# Patient Record
Sex: Female | Born: 1955 | Race: Black or African American | Hispanic: No | Smoking: Former smoker
Health system: Southern US, Community
[De-identification: ages and names within clinical notes are randomized; demographics above are authoritative.]

## PROBLEM LIST (undated history)

## (undated) ENCOUNTER — Emergency Department: Admission: EM | Payer: Medicare Other | Source: Home / Self Care

## (undated) DIAGNOSIS — I10 Essential (primary) hypertension: Secondary | ICD-10-CM

## (undated) DIAGNOSIS — J45909 Unspecified asthma, uncomplicated: Secondary | ICD-10-CM

## (undated) DIAGNOSIS — I739 Peripheral vascular disease, unspecified: Secondary | ICD-10-CM

## (undated) DIAGNOSIS — E7800 Pure hypercholesterolemia, unspecified: Secondary | ICD-10-CM

## (undated) DIAGNOSIS — D649 Anemia, unspecified: Secondary | ICD-10-CM

## (undated) DIAGNOSIS — M1990 Unspecified osteoarthritis, unspecified site: Secondary | ICD-10-CM

## (undated) DIAGNOSIS — F32A Depression, unspecified: Secondary | ICD-10-CM

## (undated) DIAGNOSIS — Z972 Presence of dental prosthetic device (complete) (partial): Secondary | ICD-10-CM

## (undated) DIAGNOSIS — K115 Sialolithiasis: Secondary | ICD-10-CM

## (undated) DIAGNOSIS — Z923 Personal history of irradiation: Secondary | ICD-10-CM

## (undated) DIAGNOSIS — J449 Chronic obstructive pulmonary disease, unspecified: Secondary | ICD-10-CM

## (undated) DIAGNOSIS — Z9981 Dependence on supplemental oxygen: Secondary | ICD-10-CM

## (undated) DIAGNOSIS — C801 Malignant (primary) neoplasm, unspecified: Secondary | ICD-10-CM

## (undated) DIAGNOSIS — IMO0001 Reserved for inherently not codable concepts without codable children: Secondary | ICD-10-CM

## (undated) DIAGNOSIS — F329 Major depressive disorder, single episode, unspecified: Secondary | ICD-10-CM

## (undated) DIAGNOSIS — G4730 Sleep apnea, unspecified: Secondary | ICD-10-CM

## (undated) DIAGNOSIS — I70219 Atherosclerosis of native arteries of extremities with intermittent claudication, unspecified extremity: Secondary | ICD-10-CM

## (undated) DIAGNOSIS — E119 Type 2 diabetes mellitus without complications: Secondary | ICD-10-CM

## (undated) HISTORY — DX: Anemia, unspecified: D64.9

## (undated) HISTORY — DX: Atherosclerosis of native arteries of extremities with intermittent claudication, unspecified extremity: I70.219

## (undated) HISTORY — DX: Peripheral vascular disease, unspecified: I73.9

## (undated) HISTORY — DX: Essential (primary) hypertension: I10

## (undated) HISTORY — PX: CYST REMOVAL LEG: SHX6280

## (undated) HISTORY — DX: Sialolithiasis: K11.5

## (undated) SURGERY — Surgical Case
Anesthesia: *Unknown

---

## 2010-04-09 ENCOUNTER — Ambulatory Visit: Payer: Self-pay

## 2010-08-03 ENCOUNTER — Emergency Department: Payer: Self-pay | Admitting: Emergency Medicine

## 2010-08-17 DIAGNOSIS — C801 Malignant (primary) neoplasm, unspecified: Secondary | ICD-10-CM

## 2010-08-17 HISTORY — DX: Malignant (primary) neoplasm, unspecified: C80.1

## 2011-07-18 ENCOUNTER — Ambulatory Visit: Payer: Self-pay | Admitting: Internal Medicine

## 2011-07-30 ENCOUNTER — Inpatient Hospital Stay: Payer: Self-pay | Admitting: Internal Medicine

## 2011-08-05 ENCOUNTER — Ambulatory Visit: Payer: Self-pay | Admitting: Internal Medicine

## 2011-08-18 ENCOUNTER — Ambulatory Visit: Payer: Self-pay | Admitting: Internal Medicine

## 2011-09-18 ENCOUNTER — Ambulatory Visit: Payer: Self-pay | Admitting: Internal Medicine

## 2011-09-29 ENCOUNTER — Encounter: Payer: Self-pay | Admitting: Cardiothoracic Surgery

## 2011-10-16 ENCOUNTER — Ambulatory Visit: Payer: Self-pay | Admitting: Internal Medicine

## 2011-10-16 ENCOUNTER — Encounter: Payer: Self-pay | Admitting: Cardiothoracic Surgery

## 2011-11-16 ENCOUNTER — Ambulatory Visit: Payer: Self-pay | Admitting: Internal Medicine

## 2011-11-30 ENCOUNTER — Encounter: Payer: Self-pay | Admitting: Cardiothoracic Surgery

## 2011-12-16 ENCOUNTER — Ambulatory Visit: Payer: Self-pay | Admitting: Internal Medicine

## 2011-12-16 ENCOUNTER — Encounter: Payer: Self-pay | Admitting: Cardiothoracic Surgery

## 2011-12-22 ENCOUNTER — Other Ambulatory Visit (HOSPITAL_COMMUNITY): Payer: Self-pay | Admitting: Cardiothoracic Surgery

## 2011-12-22 DIAGNOSIS — R911 Solitary pulmonary nodule: Secondary | ICD-10-CM

## 2011-12-25 ENCOUNTER — Other Ambulatory Visit: Payer: Self-pay | Admitting: Physician Assistant

## 2011-12-28 ENCOUNTER — Other Ambulatory Visit: Payer: Self-pay | Admitting: Radiology

## 2011-12-30 ENCOUNTER — Encounter (HOSPITAL_COMMUNITY): Payer: Self-pay

## 2011-12-30 ENCOUNTER — Ambulatory Visit (HOSPITAL_COMMUNITY)
Admission: RE | Admit: 2011-12-30 | Discharge: 2011-12-30 | Disposition: A | Discharge status: Home / Self Care | Payer: Medicaid Other | Source: Ambulatory Visit | Attending: Cardiothoracic Surgery | Admitting: Cardiothoracic Surgery

## 2011-12-30 ENCOUNTER — Ambulatory Visit (HOSPITAL_COMMUNITY)
Admission: RE | Admit: 2011-12-30 | Discharge: 2011-12-30 | Disposition: A | Discharge status: Home / Self Care | Payer: Medicaid Other | Source: Ambulatory Visit | Attending: Interventional Radiology | Admitting: Interventional Radiology

## 2011-12-30 VITALS — BP 101/59 | HR 87 | Temp 97.4°F | Resp 16 | Ht 59.0 in | Wt 131.0 lb

## 2011-12-30 DIAGNOSIS — J449 Chronic obstructive pulmonary disease, unspecified: Secondary | ICD-10-CM | POA: Insufficient documentation

## 2011-12-30 DIAGNOSIS — J984 Other disorders of lung: Secondary | ICD-10-CM | POA: Insufficient documentation

## 2011-12-30 DIAGNOSIS — Z79899 Other long term (current) drug therapy: Secondary | ICD-10-CM | POA: Insufficient documentation

## 2011-12-30 DIAGNOSIS — J4489 Other specified chronic obstructive pulmonary disease: Secondary | ICD-10-CM | POA: Insufficient documentation

## 2011-12-30 DIAGNOSIS — I10 Essential (primary) hypertension: Secondary | ICD-10-CM | POA: Insufficient documentation

## 2011-12-30 DIAGNOSIS — E7800 Pure hypercholesterolemia, unspecified: Secondary | ICD-10-CM | POA: Insufficient documentation

## 2011-12-30 DIAGNOSIS — R911 Solitary pulmonary nodule: Secondary | ICD-10-CM

## 2011-12-30 DIAGNOSIS — R918 Other nonspecific abnormal finding of lung field: Secondary | ICD-10-CM | POA: Insufficient documentation

## 2011-12-30 HISTORY — DX: Chronic obstructive pulmonary disease, unspecified: J44.9

## 2011-12-30 HISTORY — PX: LUNG BIOPSY: SHX232

## 2011-12-30 HISTORY — DX: Pure hypercholesterolemia, unspecified: E78.00

## 2011-12-30 HISTORY — DX: Essential (primary) hypertension: I10

## 2011-12-30 LAB — CBC
HCT: 39 % (ref 36.0–46.0)
Hemoglobin: 12.7 g/dL (ref 12.0–15.0)
MCHC: 32.6 g/dL (ref 30.0–36.0)
RDW: 13 % (ref 11.5–15.5)
WBC: 6.1 10*3/uL (ref 4.0–10.5)

## 2011-12-30 LAB — PROTIME-INR
INR: 0.94 (ref 0.00–1.49)
Prothrombin Time: 12.8 seconds (ref 11.6–15.2)

## 2011-12-30 LAB — APTT: aPTT: 31 seconds (ref 24–37)

## 2011-12-30 MED ORDER — SODIUM CHLORIDE 0.9 % IV SOLN
INTRAVENOUS | Status: DC
  Administered 2011-12-30: 10:00:00 via INTRAVENOUS

## 2011-12-30 MED ORDER — SODIUM CHLORIDE 0.9 % IV SOLN: INTRAVENOUS | Status: DC

## 2011-12-30 MED ORDER — FENTANYL CITRATE 0.05 MG/ML IJ SOLN
INTRAMUSCULAR | Status: AC
  Filled 2011-12-30: qty 4

## 2011-12-30 MED ORDER — HYDROCODONE-ACETAMINOPHEN 5-325 MG PO TABS: 1.0000 | ORAL_TABLET | ORAL | Status: DC | PRN

## 2011-12-30 MED ORDER — FENTANYL CITRATE 0.05 MG/ML IJ SOLN
INTRAMUSCULAR | Status: AC | PRN
  Administered 2011-12-30: 100 ug via INTRAVENOUS
  Administered 2011-12-30 (×2): 50 ug via INTRAVENOUS

## 2011-12-30 MED ORDER — MIDAZOLAM HCL 2 MG/2ML IJ SOLN
INTRAMUSCULAR | Status: AC
  Filled 2011-12-30: qty 4

## 2011-12-30 MED ORDER — MIDAZOLAM HCL 5 MG/5ML IJ SOLN
INTRAMUSCULAR | Status: AC | PRN
  Administered 2011-12-30: 1 mg via INTRAVENOUS
  Administered 2011-12-30: 2 mg via INTRAVENOUS
  Administered 2011-12-30: 1 mg via INTRAVENOUS

## 2011-12-30 NOTE — Procedures (Signed)
Procedure:  CT guided core biopsy of right upper lobe lung nodules x 2 Findings:  9 mm superior RUL nodule bx w/ outer 17 G needle and 18 G core bx x 2 15 mm inferior RUL nodule bx w/ 18 G core bx x 2 via 17 G needle. No PTX post bx by CT.  Focal hemorrhage around inferior lesion.  No hemoptysis or symptoms currently. Plan:  CXR in 2 hrs.  D/C in 2.5-3 hrs if CXR neg for PTX.

## 2011-12-30 NOTE — H&P (Signed)
Angela Jensen is an 56 y.o. female.   Chief Complaint: "I'm here for a lung biopsy" HPI: Patient with  history of tobacco abuse, COPD and two right upper lobe lung lesions presents today for CT guided right lung lesion(s) biopsy.  Past Medical History  Diagnosis Date  . COPD (chronic obstructive pulmonary disease)   . Hypertension   . Hypercholesteremia   pt denies DM, CAD, cancer, CVA  Past Surgical History  Procedure Date  . Cesarean section     x3  . Lung biopsy 05 15 2013    has lung "spots"   Family History: mom with DM , father deceased from lung cancer/CVA, 6 siblings Social History: prior long term smoker, prior alcohol use;married, lives in [place], 3 children, not employed Allergies: No Known Allergies  Current outpatient prescriptions:albuterol (PROVENTIL) (2.5 MG/3ML) 0.083% nebulizer solution, Take 2.5 mg by nebulization every 6 (six) hours as needed. wheezing, Disp: , Rfl: ;  amLODipine (NORVASC) 10 MG tablet, Take 10 mg by mouth daily., Disp: , Rfl: ;  atorvastatin (LIPITOR) 10 MG tablet, Take 10 mg by mouth daily., Disp: , Rfl: ;  hydrochlorothiazide (HYDRODIURIL) 25 MG tablet, Take 25 mg by mouth daily., Disp: , Rfl:  lisinopril (PRINIVIL,ZESTRIL) 10 MG tablet, Take 10 mg by mouth daily., Disp: , Rfl: ;  tiotropium (SPIRIVA HANDIHALER) 18 MCG inhalation capsule, Place 18 mcg into inhaler and inhale daily., Disp: , Rfl:  Current facility-administered medications:0.9 %  sodium chloride infusion, , Intravenous, Continuous, Reola Calkins, MD, Last Rate: 50 mL/hr at 12/30/11 1011;  fentaNYL (SUBLIMAZE) 0.05 MG/ML injection, , , , ;  midazolam (VERSED) 2 MG/2ML injection, , , ,    Results for orders placed during the hospital encounter of 12/30/11 (from the past 48 hour(s))  CBC     Status: Normal   Collection Time   12/30/11 10:00 AM      Component Value Range Comment   WBC 6.1  4.0 - 10.5 (K/uL)    RBC 4.40  3.87 - 5.11 (MIL/uL)    Hemoglobin 12.7  12.0 - 15.0 (g/dL)     HCT 45.4  09.8 - 11.9 (%)    MCV 88.6  78.0 - 100.0 (fL)    MCH 28.9  26.0 - 34.0 (pg)    MCHC 32.6  30.0 - 36.0 (g/dL)    RDW 14.7  82.9 - 56.2 (%)    Platelets 284  150 - 400 (K/uL)    Results for orders placed during the hospital encounter of 12/30/11  CBC      Component Value Range   WBC 6.1  4.0 - 10.5 (K/uL)   RBC 4.40  3.87 - 5.11 (MIL/uL)   Hemoglobin 12.7  12.0 - 15.0 (g/dL)   HCT 13.0  86.5 - 78.4 (%)   MCV 88.6  78.0 - 100.0 (fL)   MCH 28.9  26.0 - 34.0 (pg)   MCHC 32.6  30.0 - 36.0 (g/dL)   RDW 69.6  29.5 - 28.4 (%)   Platelets 284  150 - 400 (K/uL)    Review of Systems  Constitutional: Negative for fever and chills.  Respiratory: Positive for cough and shortness of breath.   Cardiovascular:       Intermittent rt lower/lat chest discomfort  Gastrointestinal: Negative for nausea, vomiting and abdominal pain.  Musculoskeletal: Negative for back pain.  Neurological: Negative for headaches.  Endo/Heme/Allergies: Does not bruise/bleed easily.    Blood pressure 117/75, pulse 85, temperature 97.2 F (36.2 C), temperature  source Oral, resp. rate 18, height 4\' 11"  (1.499 m), weight 131 lb (59.421 kg), SpO2 99.00%. Physical Exam  Constitutional: She is oriented to person, place, and time. She appears well-developed and well-nourished.  Cardiovascular: Normal rate and regular rhythm.   Respiratory: Effort normal.       Distant BS  GI: Soft. Bowel sounds are normal.  Musculoskeletal: Normal range of motion. She exhibits no edema.  Neurological: She is alert and oriented to person, place, and time.     Assessment/Plan Patient with COPD, prior tobacco abuse and 2 right upper lobe lung lesions; plan is for CT guided biopsy of one or both right lung lesions today if possible. Details/risks of procedure, including but not limited to , internal bleeding, infection, pneumothorax with chest tube placement, d/w pt/family with their understanding and consent. Arraya Buck,D  KEVIN 12/30/2011, 10:36 AM

## 2011-12-30 NOTE — H&P (Signed)
Agree.  Will plan to biopsy both RUL nodules.

## 2011-12-30 NOTE — Discharge Instructions (Signed)
Lung Biopsy A Lung Biopsy involves taking a small piece of lung tissue so it can be examined under a microscope by a pathologist to help diagnose many different lung disorders. This test can be done in a variety of ways. One way is to open the chest during surgery and remove a piece of lung tissue. It can also be done through a bronchoscope (a pencil-sized telescope) which allows the caregiver to remove a piece of tissue through your trachea (the large breathing tube to your lungs). Another procedure is a needle biopsy of the lung, which involves inserting a needle into the lung and removing a small piece of tissue. POSSIBLE COMPLICATIONS INCLUDE:  Collapse of the lung   Bleeding   Infection  PREPARATION FOR TEST No preparation or fasting is necessary. NORMAL FINDINGS No evidence of pathology. Ranges for normal findings may vary among different laboratories and hospitals. You should always check with your doctor after having lab work or other tests done to discuss the meaning of your test results and whether your values are considered within normal limits. MEANING OF TEST  Your caregiver will go over the test results with you and discuss the importance and meaning of your results, as well as treatment options and the need for additional tests if necessary. OBTAINING THE TEST RESULTS It is your responsibility to obtain your test results. Ask the lab or department performing the test when and how you will get your results. Document Released: 10/22/2004 Document Revised: 07/23/2011 Document Reviewed: 07/14/2008 [facility] Patient Information 2012 [place], [place].  Moderate Sedation, Adult Moderate sedation is given to help you relax or even sleep through a procedure. You may remain sleepy, be clumsy, or have poor balance for several hours following this procedure. Arrange for a responsible adult, family member, or friend to take you home. A responsible adult should stay with you for at least 24  hours or until the medicines have worn off.  Do not participate in any activities where you could become injured for the next 24 hours, or until you feel normal again. Do not:   Drive.   Swim.   Ride a bicycle.   Operate heavy machinery.   Cook.   Use power tools.   Climb ladders.   Work at [employer].   Do not make important decisions or sign legal documents until you are improved.   Vomiting may occur if you eat too soon. When you can drink without vomiting, try water, juice, or soup. Try solid foods if you feel little or no nausea.   Only take over-the-counter or prescription medications for pain, discomfort, or fever as directed by your caregiver.If pain medications have been prescribed for you, ask your caregiver how soon it is safe to take them.   Make sure you and your family fully understands everything about the medication given to you. Make sure you understand what side effects may occur.   You should not drink alcohol, take sleeping pills, or medications that cause drowsiness for at least 24 hours.   If you smoke, do not smoke alone.   If you are feeling better, you may resume normal activities 24 hours after receiving sedation.   Keep all appointments as scheduled. Follow all instructions.   Ask questions if you do not understand.  SEEK MEDICAL CARE IF:   Your skin is pale or bluish in color.   You continue to feel sick to your stomach (nauseous) or throw up (vomit).   Your pain is getting worse  and not helped by medication.   You have bleeding or swelling.   You are still sleepy or feeling clumsy after 24 hours.  SEEK IMMEDIATE MEDICAL CARE IF:   You develop a rash.   You have difficulty breathing.   You develop any type of allergic problem.   You have a fever.  Document Released: 04/28/2001 Document Revised: 07/23/2011 Document Reviewed: 09/19/2007 [facility] Patient Information 2012 [place], [place].

## 2012-01-07 ENCOUNTER — Ambulatory Visit: Payer: Self-pay | Admitting: Internal Medicine

## 2012-01-08 ENCOUNTER — Ambulatory Visit: Payer: Self-pay | Admitting: Internal Medicine

## 2012-01-11 ENCOUNTER — Ambulatory Visit: Payer: Self-pay | Admitting: Internal Medicine

## 2012-01-16 ENCOUNTER — Ambulatory Visit: Payer: Self-pay | Admitting: Internal Medicine

## 2012-01-16 ENCOUNTER — Encounter: Payer: Self-pay | Admitting: Cardiothoracic Surgery

## 2012-02-02 LAB — CBC CANCER CENTER
Basophil %: 0.9 %
Eosinophil %: 1.9 %
HCT: 38.6 % (ref 35.0–47.0)
HGB: 12.4 g/dL (ref 12.0–16.0)
MCHC: 32.1 g/dL (ref 32.0–36.0)
Monocyte #: 0.5 x10 3/mm (ref 0.2–0.9)
Monocyte %: 8.9 %
Neutrophil #: 3.4 x10 3/mm (ref 1.4–6.5)
Neutrophil %: 65.3 %
RBC: 4.35 10*6/uL (ref 3.80–5.20)

## 2012-02-09 LAB — CBC CANCER CENTER
Eosinophil %: 1.4 %
HGB: 13.2 g/dL (ref 12.0–16.0)
Lymphocyte #: 1 x10 3/mm (ref 1.0–3.6)
MCH: 28.1 pg (ref 26.0–34.0)
MCHC: 31.6 g/dL — ABNORMAL LOW (ref 32.0–36.0)
Monocyte #: 0.4 x10 3/mm (ref 0.2–0.9)
Neutrophil #: 3 x10 3/mm (ref 1.4–6.5)
Neutrophil %: 66.9 %

## 2012-02-15 ENCOUNTER — Ambulatory Visit: Payer: Self-pay | Admitting: Internal Medicine

## 2012-02-15 ENCOUNTER — Encounter: Payer: Self-pay | Admitting: Cardiothoracic Surgery

## 2012-02-16 LAB — CBC CANCER CENTER
Basophil #: 0 x10 3/mm (ref 0.0–0.1)
Basophil %: 0.9 %
Eosinophil #: 0.1 x10 3/mm (ref 0.0–0.7)
HGB: 13 g/dL (ref 12.0–16.0)
Lymphocyte %: 22 %
MCH: 28.3 pg (ref 26.0–34.0)
MCHC: 32.1 g/dL (ref 32.0–36.0)
Monocyte #: 0.4 x10 3/mm (ref 0.2–0.9)
Neutrophil %: 65.8 %
Platelet: 225 x10 3/mm (ref 150–440)
RBC: 4.58 10*6/uL (ref 3.80–5.20)
RDW: 13.1 % (ref 11.5–14.5)

## 2012-02-23 LAB — CBC CANCER CENTER
Basophil #: 0 x10 3/mm (ref 0.0–0.1)
Basophil %: 0.7 %
Eosinophil #: 0.1 x10 3/mm (ref 0.0–0.7)
Eosinophil %: 1.8 %
HGB: 12.9 g/dL (ref 12.0–16.0)
Lymphocyte #: 0.6 x10 3/mm — ABNORMAL LOW (ref 1.0–3.6)
MCH: 28.2 pg (ref 26.0–34.0)
MCHC: 31.9 g/dL — ABNORMAL LOW (ref 32.0–36.0)
MCV: 89 fL (ref 80–100)
Monocyte #: 0.4 x10 3/mm (ref 0.2–0.9)
Neutrophil %: 71.1 %
RBC: 4.57 10*6/uL (ref 3.80–5.20)

## 2012-03-01 LAB — CBC CANCER CENTER
Basophil %: 0.5 %
Eosinophil %: 2.7 %
Lymphocyte %: 13.7 %
Neutrophil %: 72.4 %

## 2012-03-17 ENCOUNTER — Encounter: Payer: Self-pay | Admitting: Cardiothoracic Surgery

## 2012-03-17 ENCOUNTER — Ambulatory Visit: Payer: Self-pay | Admitting: Internal Medicine

## 2012-04-17 ENCOUNTER — Ambulatory Visit: Payer: Self-pay | Admitting: Internal Medicine

## 2012-04-17 ENCOUNTER — Encounter: Payer: Self-pay | Admitting: Cardiothoracic Surgery

## 2012-05-17 ENCOUNTER — Ambulatory Visit: Payer: Self-pay | Admitting: Internal Medicine

## 2012-07-18 ENCOUNTER — Ambulatory Visit: Payer: Self-pay | Admitting: Internal Medicine

## 2012-07-18 LAB — CREATININE, SERUM: Creatinine: 0.78 mg/dL (ref 0.60–1.30)

## 2012-08-17 ENCOUNTER — Ambulatory Visit: Payer: Self-pay | Admitting: Internal Medicine

## 2012-09-21 ENCOUNTER — Ambulatory Visit: Payer: Self-pay | Admitting: Internal Medicine

## 2012-10-15 ENCOUNTER — Ambulatory Visit: Payer: Self-pay | Admitting: Internal Medicine

## 2012-11-11 LAB — CBC CANCER CENTER
Basophil #: 0 x10 3/mm (ref 0.0–0.1)
Basophil %: 0.9 %
HGB: 11.9 g/dL — ABNORMAL LOW (ref 12.0–16.0)
Monocyte #: 0.4 x10 3/mm (ref 0.2–0.9)
Monocyte %: 11.1 %
Neutrophil %: 58.1 %
Platelet: 212 x10 3/mm (ref 150–440)
RBC: 3.99 10*6/uL (ref 3.80–5.20)
RDW: 13.5 % (ref 11.5–14.5)
WBC: 4 x10 3/mm (ref 3.6–11.0)

## 2012-11-11 LAB — HEPATIC FUNCTION PANEL A (ARMC)
Bilirubin,Total: 0.4 mg/dL (ref 0.2–1.0)
SGOT(AST): 20 U/L (ref 15–37)

## 2012-11-11 LAB — BASIC METABOLIC PANEL
Chloride: 100 mmol/L (ref 98–107)
Creatinine: 1.03 mg/dL (ref 0.60–1.30)
EGFR (African American): >60
EGFR (Non-African Amer.): >60
Glucose: 176 mg/dL — ABNORMAL HIGH (ref 65–99)
Osmolality: 279 (ref 275–301)
Potassium: 3.9 mmol/L (ref 3.5–5.1)

## 2012-11-15 ENCOUNTER — Ambulatory Visit: Payer: Self-pay | Admitting: Internal Medicine

## 2012-11-23 ENCOUNTER — Ambulatory Visit: Payer: Self-pay | Admitting: Internal Medicine

## 2012-12-15 ENCOUNTER — Ambulatory Visit: Payer: Self-pay | Admitting: Internal Medicine

## 2013-01-31 ENCOUNTER — Ambulatory Visit: Payer: Self-pay

## 2013-02-06 ENCOUNTER — Ambulatory Visit: Payer: Self-pay

## 2013-03-21 ENCOUNTER — Ambulatory Visit: Payer: Self-pay | Admitting: Internal Medicine

## 2013-03-31 LAB — CBC CANCER CENTER
Basophil %: 1.2 %
Eosinophil %: 2 %
HCT: 37.4 % (ref 35.0–47.0)
HGB: 12.6 g/dL (ref 12.0–16.0)
Lymphocyte %: 23.6 %
MCH: 29.9 pg (ref 26.0–34.0)
MCV: 89 fL (ref 80–100)
Monocyte %: 6.7 %
Neutrophil #: 3.2 x10 3/mm (ref 1.4–6.5)
Platelet: 239 x10 3/mm (ref 150–440)
RBC: 4.21 10*6/uL (ref 3.80–5.20)
RDW: 12.9 % (ref 11.5–14.5)

## 2013-03-31 LAB — BASIC METABOLIC PANEL
Calcium, Total: 9.2 mg/dL (ref 8.5–10.1)
Co2: 29 mmol/L (ref 21–32)
EGFR (African American): >60
Osmolality: 278 (ref 275–301)
Potassium: 3.7 mmol/L (ref 3.5–5.1)

## 2013-03-31 LAB — HEPATIC FUNCTION PANEL A (ARMC)
Albumin: 4 g/dL (ref 3.4–5.0)
Alkaline Phosphatase: 89 U/L (ref 50–136)
Bilirubin,Total: 0.2 mg/dL (ref 0.2–1.0)
SGPT (ALT): 25 U/L (ref 12–78)
Total Protein: 7.8 g/dL (ref 6.4–8.2)

## 2013-04-17 ENCOUNTER — Ambulatory Visit: Payer: Self-pay | Admitting: Internal Medicine

## 2013-09-29 ENCOUNTER — Ambulatory Visit: Payer: Self-pay | Admitting: Internal Medicine

## 2013-10-02 LAB — HEPATIC FUNCTION PANEL A (ARMC)
AST: 22 U/L (ref 15–37)
Albumin: 3.9 g/dL (ref 3.4–5.0)
Alkaline Phosphatase: 66 U/L
BILIRUBIN TOTAL: 0.2 mg/dL (ref 0.2–1.0)
Bilirubin, Direct: <0.05 mg/dL (ref 0.00–0.20)
SGPT (ALT): 21 U/L (ref 12–78)
Total Protein: 7.6 g/dL (ref 6.4–8.2)

## 2013-10-02 LAB — CBC CANCER CENTER
Basophil #: 0.1 x10 3/mm (ref 0.0–0.1)
Basophil %: 1.3 %
EOS ABS: 0.2 x10 3/mm (ref 0.0–0.7)
Eosinophil %: 3.2 %
HCT: 39 % (ref 35.0–47.0)
HGB: 12.4 g/dL (ref 12.0–16.0)
LYMPHS ABS: 1.2 x10 3/mm (ref 1.0–3.6)
Lymphocyte %: 19.3 %
MCH: 28.5 pg (ref 26.0–34.0)
MCHC: 31.9 g/dL — ABNORMAL LOW (ref 32.0–36.0)
MCV: 89 fL (ref 80–100)
Monocyte #: 0.4 x10 3/mm (ref 0.2–0.9)
Monocyte %: 6.1 %
NEUTROS ABS: 4.2 x10 3/mm (ref 1.4–6.5)
Neutrophil %: 70.1 %
PLATELETS: 238 x10 3/mm (ref 150–440)
RBC: 4.36 10*6/uL (ref 3.80–5.20)
RDW: 13.2 % (ref 11.5–14.5)
WBC: 6 x10 3/mm (ref 3.6–11.0)

## 2013-10-02 LAB — BASIC METABOLIC PANEL
Anion Gap: 10 (ref 7–16)
BUN: 13 mg/dL (ref 7–18)
CHLORIDE: 103 mmol/L (ref 98–107)
Calcium, Total: 8.5 mg/dL (ref 8.5–10.1)
Co2: 31 mmol/L (ref 21–32)
Creatinine: 0.88 mg/dL (ref 0.60–1.30)
EGFR (African American): >60
EGFR (Non-African Amer.): >60
Glucose: 167 mg/dL — ABNORMAL HIGH (ref 65–99)
Osmolality: 291 (ref 275–301)
POTASSIUM: 3.6 mmol/L (ref 3.5–5.1)
SODIUM: 144 mmol/L (ref 136–145)

## 2013-10-15 ENCOUNTER — Ambulatory Visit: Payer: Self-pay | Admitting: Internal Medicine

## 2013-11-15 ENCOUNTER — Ambulatory Visit: Payer: Self-pay | Admitting: Internal Medicine

## 2014-02-13 ENCOUNTER — Ambulatory Visit: Payer: Self-pay | Admitting: Physician Assistant

## 2014-04-17 ENCOUNTER — Ambulatory Visit: Payer: Self-pay | Admitting: Internal Medicine

## 2014-04-17 LAB — HEPATIC FUNCTION PANEL A (ARMC)
ALK PHOS: 73 U/L
Albumin: 4.1 g/dL (ref 3.4–5.0)
Bilirubin,Total: 0.4 mg/dL (ref 0.2–1.0)
SGOT(AST): 27 U/L (ref 15–37)
SGPT (ALT): 29 U/L
Total Protein: 8.1 g/dL (ref 6.4–8.2)

## 2014-04-17 LAB — BASIC METABOLIC PANEL
Anion Gap: 9 (ref 7–16)
BUN: 10 mg/dL (ref 7–18)
CALCIUM: 9.1 mg/dL (ref 8.5–10.1)
Chloride: 102 mmol/L (ref 98–107)
Co2: 30 mmol/L (ref 21–32)
Creatinine: 0.9 mg/dL (ref 0.60–1.30)
EGFR (Non-African Amer.): >60
Glucose: 201 mg/dL — ABNORMAL HIGH (ref 65–99)
Osmolality: 286 (ref 275–301)
Potassium: 3.9 mmol/L (ref 3.5–5.1)
Sodium: 141 mmol/L (ref 136–145)

## 2014-04-17 LAB — CBC CANCER CENTER
BASOS ABS: 0.1 x10 3/mm (ref 0.0–0.1)
Basophil %: 1.2 %
EOS ABS: 0.2 x10 3/mm (ref 0.0–0.7)
EOS PCT: 3.2 %
HCT: 42.1 % (ref 35.0–47.0)
HGB: 13.3 g/dL (ref 12.0–16.0)
LYMPHS PCT: 22.6 %
Lymphocyte #: 1.1 x10 3/mm (ref 1.0–3.6)
MCH: 28.6 pg (ref 26.0–34.0)
MCHC: 31.7 g/dL — AB (ref 32.0–36.0)
MCV: 90 fL (ref 80–100)
Monocyte #: 0.3 x10 3/mm (ref 0.2–0.9)
Monocyte %: 6 %
NEUTROS ABS: 3.4 x10 3/mm (ref 1.4–6.5)
NEUTROS PCT: 67 %
PLATELETS: 254 x10 3/mm (ref 150–440)
RBC: 4.66 10*6/uL (ref 3.80–5.20)
RDW: 13.6 % (ref 11.5–14.5)
WBC: 5.1 x10 3/mm (ref 3.6–11.0)

## 2014-05-17 ENCOUNTER — Ambulatory Visit: Payer: Self-pay | Admitting: Internal Medicine

## 2014-10-16 ENCOUNTER — Ambulatory Visit
Admit: 2014-10-16 | Disposition: A | Discharge status: Home / Self Care | Payer: Self-pay | Attending: Internal Medicine | Admitting: Internal Medicine

## 2014-11-16 ENCOUNTER — Ambulatory Visit
Admit: 2014-11-16 | Disposition: A | Discharge status: Home / Self Care | Payer: Self-pay | Attending: Internal Medicine | Admitting: Internal Medicine

## 2014-11-30 ENCOUNTER — Other Ambulatory Visit: Payer: Self-pay | Admitting: Internal Medicine

## 2014-11-30 DIAGNOSIS — C3491 Malignant neoplasm of unspecified part of right bronchus or lung: Secondary | ICD-10-CM

## 2014-11-30 DIAGNOSIS — R911 Solitary pulmonary nodule: Secondary | ICD-10-CM

## 2014-12-09 NOTE — Discharge Summary (Signed)
PATIENT NAME:  Angela Jensen, Angela Jensen MR#:  [id number] DATE OF BIRTH:  06-15-1956  DATE OF ADMISSION:  07/30/2011 DATE OF DISCHARGE:  08/05/2011  ADMITTING DIAGNOSES:  1. Chronic obstructive pulmonary disease exacerbation.  2. Ongoing tobacco abuse.  3. Hypertension. 4. Hyperlipidemia.   DISCHARGE DIAGNOSES:  1. Chronic obstructive pulmonary disease. 2. Acute bronchitis. 3. Dehydration.  4. Right upper lobe 10 mm speculated nodule concerning for cancer on PET scan.  5. Hyponatremia, hypokalemia, and hypomagnesemia, resolved.  6. Hypertension. 7. Hyperlipidemia. 8. Ongoing tobacco abuse.   DISCHARGE CONDITION: Stable.   DISCHARGE MEDICATIONS: The patient is to resume her outpatient medications which include the following. 1. Hydrochlorothiazide/lisinopril 12.5/10 mg once daily. 2. Norvasc 10 mg p.o. daily. 3. Lipitor 10 mg p.o. at bedtime.   Additional Medications:  1. Prednisone 40 mg p.o. once on 08/06/2011 then taper by 10 mg every second day until stopped. 2. Advair Diskus 250/50 one puff twice daily.  3. Tiotropium inhaler one capsule daily via nebulizer machine every 4 hours p.r.n.  4. Combivent 2 puffs every two hours as needed.   HOME OXYGEN: Home oxygen with portable tank will be prescribed upon discharge at 2 liters of oxygen through nasal cannula on exertion.  DISCHARGE DIET:  2 gram salt, low fat.   PHYSICAL ACTIVITY LIMITATIONS: As tolerated.   DISCHARGE FOLLOWUP/INSTRUCTIONS:  Followup with Dr. Genevive Bi on 08/13/2011, on Thursday. Followup with Dr. Leia Alf on 08/21/2011. Also followup with the [facility] in 2 days after discharge. The patient was advised not to smoke. She also will have a prescription for a nebulizer machine She also has an appointment with the [facility] on 09/03/2011.  The patient received the pneumonia vaccine upon discharge.  HISTORY OF PRESENT ILLNESS: Please refer to Dr. Haynes Dage interim discharge summary on 08/04/2011.   The  patient is a 59 year old female with past medical history significant for history of tobacco abuse, long-standing tobacco abuse, who presented to the hospital with complaints of shortness of breath as well as cough. Please refer to Dr. Zigmund Gottron admission note on 07/30/2011.   On arrival to the emergency room, the patient's temperature was 98.6, pulse 102, respiration rate 16, blood pressure 92/59, and saturation was 93% on 3 liters of oxygen through nasal cannula. Physical exam revealed diffuse wheezing as well as coarse breath sounds on lung examination and prolonged inspiratory as well as expiratory phase, but no dullness to percussion.  The patient was admitted to the hospital. Her chest x-ray on arrival to the hospital, done on 07/30/2011, PA and lateral, revealed hyperinflation, otherwise no acute cardiopulmonary disease, however there were findings concerning for small nodule in the right lung apex were noted and CT was recommended.   HOSPITAL COURSE: The patient was admitted to the hospital for chronic obstructive pulmonary disease exacerbation. She was started on antibiotic therapy was well as steroids and inhalation therapy. With this therapy she improved. It was felt that the patient's COPD exacerbation was very likely related to bronchitis. Sputum cultures revealed scant growth of routine respiratory flora, rare gram-negative as well as a few white blood cells and few epithelial cells were noted. With conservative therapy the patient's condition improved. She is to continue tapering her steroids as well as continuing inhalation therapy and follow-up with her primary care physician for further recommendations. She however required oxygen upon discharge. She was evaluated for oxygen need and she was noted to have oxygen saturation of 85% on room air on exertion. She improved to  91% on 2 liters of oxygen on exertion. It was felt that 2 liters would be sufficient for her to be used on exertion as  well as possibly at bedtime as well. Her oxygen saturation improved to 92% whenever she was resting, on room air. It was felt that the patient is okay to take her oxygen off whenever she is resting. However, she will require at least 2 liters of oxygen through nasal cannula whenever she exerts herself. She is to followup with the [facility] as well as Dr. Leia Alf and Dr. Nestor Lewandowsky. The reason she needs to be followed up by Dr. Leia Alf as well as Dr. Genevive Bi is she was found to have a nodule in her right upper lung. CT scan done on 07/31/2011 revealed a spiculated nodule in the apex of the right upper lobe. Further evaluation with PET or CT scan was recommended for possibly further characterizing this finding. Indeterminate 9 and 4 mm nodules within the right upper lobe were also noted, also advanced emphysematous changes. The patient was evaluated by Dr. Ma Hillock as well as pulmonologist, Dr. Devona Konig. Dr. Ma Hillock felt that the patient has longstanding history of smoking and was admitted for symptoms of chronic obstructive pulmonary disease exacerbation and is clinically improving.  However, she was found to have right upper lobe nodules and PET scan was done on 08/03/2011. PET CT scan for lung cancer diagnosis revealed spiculated hypermetabolic 10 mm right upper lobe pulmonary nodule. Differential diagnosis includes malignancy versus granulomatous disease versus infectious or inflammatory nodule. Overall appearance is most concerning for malignancy until proven otherwise, according to the radiologist. For this reason Dr. Ma Hillock explained to the patient as well as to her husband the above findings all radiologic studies and films were reviewed with them. Dr. Ma Hillock explained the nodule would not be accessible by bronchoscopy and very likely too small to safely biopsy with CT-guided biopsy. Therefore he recommended to consult thoracic surgeon, Dr. Genevive Bi, for his opinion regarding whether the nodule  should be resected. Dr. Ma Hillock also explained to the family that if it is lung radiologically it appeared to be stage I disease and surgical resection would be likely curable. The patient also was explained that the PET scan also revealed a prominent left ovary measuring 3.9/2.4 cm, not reported to be hypermetabolic on PET scan, however, Dr. Ma Hillock recommended to get CA-125 level checked and follow-up with him as an outpatient. Dr. Genevive Bi saw the patient in consultation on 08/05/2011 and he recommended further assessment and treatment and to followup with him in the [place] in the next few days after discharge. Dr. Genevive Bi believed that PET scan likely represents malignancy in the right upper lobe. Given the acute illness, he recommended to let her get over this illness and see her back in the office in approximately one week. At that time, he could perform simple pulmonary function tests and he also noted that the patient's bicarbonate was reported to be 30 on admission suggesting the patient has some evidence of chronic hypercapnia. He also recommended to check arterial blood gases and counseled her about need to come to her followup. She agreed to do so and she is to follow-up with him in the next few days after discharge to make decisions about therapy.  On the day of discharge, 08/05/2011, the patient felt satisfactory and did not complain of any significant discomfort and certainly no significant shortness of breath at rest. Her vitals were stable with temperature of 98.1,  pulse 71, respiration rate 18, blood pressure 114/73, and saturation was 97% on 1 liter of oxygen through nasal cannula at rest and 92% on room air at rest, 85% on exertion on room air, and 91% on 2 liters of oxygen through nasal cannula on exertion.   The patient is being discharged with the above-mentioned medications and follow-up.  Of note, in regards to hypertension and hyperlipidemia, the patient is to continue her outpatient  medications. The patient's blood pressure was well controlled, however, the patient's lipid panel was not rechecked while she was here in the hospital. Of note, while in the hospital, the patient was also had one episode of hyponatremia with sodium level of 135. However, with IV fluid administration, the patient's sodium level normalized to 138 by the day of discharge, 08/05/2011. She was also noted to be hypokalemic and HER potassium level was replenished to 4.1 on 08/05/2011. It is recommended to follow the patient's potassium levels as well as sodium levels as HCTZ could cause chronic hyponatremia as well as hypokalemia. The patient is being discharged, as mentioned above, in stable condition with the above-mentioned medications and follow-up.   TIME SPENT: 40 minutes.  ____________________________ Theodoro Grist, MD rv:[id number] D: 08/07/2011 14:44:03 ET T: 08/08/2011 14:41:16 ET JOB#: [id number]  cc: Theodoro Grist, MD, <Dictator> [facility] [place] MD ELECTRONICALLY SIGNED 09/13/2011 9:51

## 2014-12-09 NOTE — Consult Note (Signed)
Reason for Visit: This 59 year old Female patient presents to the clinic for initial evaluation of  Right upper lobe nodules .   Referred by Dr. Ma Hillock.  Diagnosis:   Chief Complaint/Diagnosis   59 year old female with small right upper lobe nodules as yet unbiopsied   Imaging Report CT scans reviewed    Referral Report Clinical notes reviewed    Planned Treatment Regimen Further workup including possible biopsy    HPI   patient is a 59 year old female oxygen dependent presented to [facility] with obstructive symptoms and exacerbation of COPD. Chest x-ray initially showed a nodule in the right lung apex. CT scan on 07/31/11 showed a 1.1 cm nodule in the right upper lobe as well as a separate 9 mm nodule in the uric inferior aspect of the right upper lobe she also had significant advanced emphysematous changes of both lung fields. PET was performed showing a right upper lobe nodule with an SUV of 2.5. She also prominent left ovary measuring approximately 4 cm in greatest dimension. She has been seen by Dr. Faith Rogue has been started on pulmonary rehabilitation is post have repeat pulmonary function tests this week. Her prior tests showed an FEV1 of 540 mL. She seen today for radiation oncology opinion. She is doing fairly well. Her breathing has improved she still oxygen dependent.  Past, Family and Social History:   Past Medical History positive    Cardiovascular hyperlipidemia; hypertension    Respiratory bronchitis; COPD    Family History positive    Family History Comments Father with lung cancer family history of coronary vascular heart disease    Social History positive    Social History Comments 80-pack-year smoking history, history of marijuana use    Additional Past Medical and Surgical History Accompanied by daughter today   Allergies:   No Known Allergies:   Home Meds:  Home Medications: Medication Instructions Status  DuoNeb 0.5 mg-2.5 mg/3 mL inhalation  solution 3 milliliter(s) inhaled via neb machine every 4 hours as needed for SOB or wheezing Active  Lipitor 10 mg oral tablet 1 tab(s) orally once a day (at bedtime) Active  Ventolin HFA 90 mcg/inh inhalation aerosol 2 puff(s) inhaled 4 times a day Active  Advair Diskus 250 mcg-50 mcg inhalation powder 1 puff(s) inhaled 2 times a day Active  Spiriva 18 mcg inhalation capsule 1 each inhaled once a day Active  lisinopril 10 mg oral tablet 1 tab(s) orally once a day Active  hydrochlorothiazide 25 mg oral tablet 0.5 tab(s) orally once a day Active  amlodipine 10 mg oral tablet 1 tab(s) orally once a day Active  oxygen 2 L/m 24 hours a day  Active   Review of Systems:   General negative    Performance Status (ECOG) 0    Skin negative    Breast negative    Ophthalmologic negative    ENMT negative    Respiratory and Thorax see HPI    Cardiovascular negative    Gastrointestinal negative    Genitourinary negative    Musculoskeletal negative    Neurological negative    Psychiatric negative    Hematology/Lymphatics negative    Endocrine negative    Allergic/Immunologic negative   Nursing Notes:  Nursing Vital Signs and Chemo Nursing Nursing Notes: *CC Vital Signs Flowsheet:   09-Apr-13 11:01   Temp Temperature 98   Pulse Pulse 84   Respirations Respirations 20   SBP SBP 119   DBP DBP 76   Pain Scale (0-10)  1   Current Weight (kg) (kg) 57.2   Height (cm) centimeters 152.4   BSA (m2) 1.5   Physical Exam:  General/Skin/HEENT:   General normal    Skin normal    Eyes normal    ENMT normal    Head and Neck normal    Additional PE Well-developed female in NAD. She is on nasal oxygen. Lung fields are clear to A&P. Cardiac examination shows regular rate and rhythm without murmur rub or thrill. Abdomen is benign with no organomegaly or masses noted.   Breasts/Resp/CV/GI/GU:   Respiratory and Thorax normal    Cardiovascular normal    Gastrointestinal normal     Genitourinary normal   MS/Neuro/Psych/Lymph:   Musculoskeletal normal    Neurological normal    Lymphatics normal   Other Results:  Radiology Results: CT:    18-Dec-11 15:58, CT Head Without Contrast   CT Head Without Contrast    REASON FOR EXAM:    dizziness  COMMENTS:       PROCEDURE: CT  - CT HEAD WITHOUT CONTRAST  - Aug 03 2010  3:58PM     RESULT: Axial noncontrast CT scanning was performed through the brain at   5 mm intervals and slice thicknesses.    The ventricles arenormal in size and position. There is no intracranial   hemorrhage nor intracranial mass effect. There are punctate basal ganglia   calcifications noted on the right. The cerebellum and brainstem are   normal in density. There is no evidence of an evolving ischemic   infarction. At bone window settings the observed portions of the   paranasal sinuses and mastoid air cells are clear.    IMPRESSION:  I see no acute intracranial abnormality.          Verified By: DAVID A. Martinique, M.D., MD    28-Mar-13 10:59, CT Chest Without Contrast   CT Chest Without Contrast    REASON FOR EXAM:    FU lung mass  COMMENTS:       PROCEDURE: CT  - CT CHEST WITHOUT CONTRAST  - Nov 12 2011 10:59AM     RESULT: Indication: Lung mass in    Comparison: 07/31/2011    Technique: Multiple axial images of the chest are obtained without   intravenous contrast.    Findings:    The central airways are patent. There are severe bilateral emphysematous     changes. There is a 12 mm spiculated right apical pulmonary nodule   unchanged compared with 07/31/2011. There is a 18 mm subpleural nodular   opacity in the posterior segment of the right upper lobe with interval   enlargement compared with 07/31/2011. There is no pleural effusion or   pneumothorax.    There are no pathologically enlarged axillary, hilar, or mediastinal   lymph nodes.      The heart size is normal. There is no pericardial effusion. The thoracic    aorta is normal in caliber.      Review of bone windows demonstrates no focal lytic or sclerotic lesions.     Limited noncontrast images of the upper abdomen were obtained. The     adrenal glands appear normal. The remainder of the visualized abdominal   organs are unremarkable.    IMPRESSION:     1.  There is a 12 mm spiculated right apical pulmonary nodule unchanged   compared with 07/31/2011.  The appearance is concerning for malignancy   until proven otherwise.     2.There  is a 18 mm subpleural nodular opacity in the posterior segment of   the right upper lobe with interval enlargement compared with 07/31/2011.   This may be secondary to an infectious or inflammatory etiology, but   neoplasm cannot be excluded.        Verified By: Jennette Banker, M.D., MD  Nuclear Med:    17-Dec-12 15:40, PET/CT Scan Lung Cancer Diagnosis   PET/CT Scan Lung Cancer Diagnosis    REASON FOR EXAM:    spiculated pulmonary nodule  COMMENTS:       PROCEDURE: PET - PET/CT DX LUNG CA  - Aug 03 2011  3:40PM     RESULT: Indication: Spiculated pulmonary nodule  Radiopharmaceutical: 11.86 mCi F18-FDG, intravenously.    Technique: Imaging was performed from the skull base to the mid-thigh   using routine PET/CT acquisition protocol.    Injection site: Left forearm  Time of FDG injection: 1350 hours  Serum glucose: 99 mg/dL   Time of imaging: 1514 hours  Comparison studies: None    Findings:    HEAD AND NECK:     There is no abnormal hypermetabolic activity in the head and neck. There   is no cervical soft tissue mass or lymphadenopathy.     CHEST:    There is a 10 mm spiculated right apical pulmonary nodule weighted an SUV   maxof 2.5 and an SUV average of 2.5 . There bilateral emphysematous   changes. There is no other pulmonary mass or pulmonary nodule. There is   no focal parenchymal opacity, pleural effusion, or pneumothorax. The     heart size is normal. There is no pericardial  effusion.     There no pathologically enlarged mediastinal, hilar, or axillary lymph   nodes.     ABDOMEN/PELVIS:    The liver demonstrates no focal abnormality. The gallbladder is   unremarkable. The spleen demonstrates no focal abnormality. The kidneys,   adrenal glands, pancreas are normal. The bladder is unremarkable.     The unopacified bowel is unremarkable. There is no pneumoperitoneum,   pneumatosis, or portal venous gas. There is no abdominal or pelvic free   fluid. There is no lymphadenopathy. The left ovary is prominent in size   measuring 3.9 x 2.4 cm.  The abdominal aorta is normal in caliber.    The osseous structures are unremarkable.    IMPRESSION:     1. Spiculated hypermetabolic 10 mm right upper lobe pulmonary nodule.   Differential diagnosis includes malignancy versus granulomatous disease   versus an infectious or inflammatory nodule. The overall appearance is   most concerning for malignancy until proven otherwise.          Verified By: Jennette Banker, M.D.,MD   Assessment and Plan:  Impression:   not biopsy the right upper lobe nodules suspicious in 59 year old female  Plan:   this time I have had a long discussion with the patient and her daughter. Would not start radiation therapy in a patient without tissue diagnosis especially with nodules that are so small and innocuous. Would repeat her pulmonary function tests as planned this week. Could also follow the patient with repeat CT scans in 3-4 months to look for growth in these nodules and perhaps attempt at needle biopsy. Patient will be seen back as we by Dr. Faith Rogue who may make formal recommendations should her pulmonary function tests improved significantly. We will followup with her as needed. I have discussed the case personally with Dr. Ma Hillock.  I would like to take this opportunity to thank you for allowing me to continue to participate in this patient's care.  Electronic Signatures: Armstead Peaks (MD)  (Signed 09-Apr-13 14:14)  Authored: HPI, Diagnosis, PFSH, Allergies, Home Meds, ROS, Nursing Notes, Physical Exam, Other Results, Encounter Assessment and Plan   Last Updated: 09-Apr-13 14:14 by Armstead Peaks (MD)

## 2015-01-24 ENCOUNTER — Other Ambulatory Visit: Payer: Self-pay | Admitting: Internal Medicine

## 2015-01-24 ENCOUNTER — Ambulatory Visit
Admission: RE | Admit: 2015-01-24 | Discharge: 2015-01-24 | Disposition: A | Discharge status: Home / Self Care | Payer: Medicaid Other | Source: Ambulatory Visit | Attending: Internal Medicine | Admitting: Internal Medicine

## 2015-01-24 DIAGNOSIS — Z923 Personal history of irradiation: Secondary | ICD-10-CM | POA: Insufficient documentation

## 2015-01-24 DIAGNOSIS — C3491 Malignant neoplasm of unspecified part of right bronchus or lung: Secondary | ICD-10-CM | POA: Diagnosis not present

## 2015-01-24 DIAGNOSIS — R911 Solitary pulmonary nodule: Secondary | ICD-10-CM

## 2015-01-24 DIAGNOSIS — C3411 Malignant neoplasm of upper lobe, right bronchus or lung: Secondary | ICD-10-CM

## 2015-01-24 HISTORY — DX: Type 2 diabetes mellitus without complications: E11.9

## 2015-01-24 HISTORY — DX: Unspecified asthma, uncomplicated: J45.909

## 2015-01-24 HISTORY — DX: Malignant (primary) neoplasm, unspecified: C80.1

## 2015-01-24 MED ORDER — IOHEXOL 350 MG/ML SOLN
75.0000 mL | Freq: Once | INTRAVENOUS | Status: AC | PRN
  Administered 2015-01-24: 75 mL via INTRAVENOUS

## 2015-01-25 ENCOUNTER — Other Ambulatory Visit: Payer: Self-pay | Admitting: *Deleted

## 2015-01-25 DIAGNOSIS — C3411 Malignant neoplasm of upper lobe, right bronchus or lung: Secondary | ICD-10-CM

## 2015-01-28 ENCOUNTER — Ambulatory Visit: Payer: Medicaid Other

## 2015-01-30 ENCOUNTER — Ambulatory Visit
Admission: RE | Admit: 2015-01-30 | Discharge: 2015-01-30 | Disposition: A | Discharge status: Home / Self Care | Payer: Medicaid Other | Source: Ambulatory Visit | Attending: Internal Medicine | Admitting: Internal Medicine

## 2015-01-30 DIAGNOSIS — C3411 Malignant neoplasm of upper lobe, right bronchus or lung: Secondary | ICD-10-CM | POA: Diagnosis present

## 2015-01-30 LAB — GLUCOSE, CAPILLARY: Glucose-Capillary: 100 mg/dL — ABNORMAL HIGH (ref 65–99)

## 2015-01-30 MED ORDER — FLUDEOXYGLUCOSE F - 18 (FDG) INJECTION
12.5600 | Freq: Once | INTRAVENOUS | Status: AC | PRN
  Administered 2015-01-30: 12.56 via INTRAVENOUS

## 2015-02-07 ENCOUNTER — Encounter: Payer: Self-pay | Admitting: Cardiothoracic Surgery

## 2015-02-07 ENCOUNTER — Inpatient Hospital Stay: Payer: Medicaid Other | Attending: Cardiothoracic Surgery | Admitting: Cardiothoracic Surgery

## 2015-02-07 VITALS — BP 158/96 | HR 91 | Temp 98.2°F | Ht 59.0 in | Wt 117.6 lb

## 2015-02-07 DIAGNOSIS — C3411 Malignant neoplasm of upper lobe, right bronchus or lung: Secondary | ICD-10-CM

## 2015-02-07 DIAGNOSIS — R918 Other nonspecific abnormal finding of lung field: Secondary | ICD-10-CM | POA: Diagnosis not present

## 2015-02-07 NOTE — Progress Notes (Signed)
[name], female   DOB: [birth year], 59 y.o.   MRN: [id number]  HISTORY: I have been asked to reevaluate this patient again. She is known to me. She had a previous history of 2 right upper lobe lesions that were treated with radiation therapy. He has been followed by Dr. Loni Muse it and by Dr. said.Yancey Flemings. A repeat CT scan shows a new right upper lobe nodule. This was PET positive. Been asked to reevaluate patient for consideration of surgical intervention. Patient states that she does get short of breath with exertion. She's had some pulmonary function studies done with Dr. Yancey Flemings. We will obtain those results. I have independently reviewed the CT scan and PET scan. She does not smoke cigarettes although she does smoke several joints per day.   PERTINENT REVIEW OF SYSTEMS: Shortness of breath with exertion. No weight loss. No hemoptysis or cough.  Filed Vitals:   02/07/15 0935  BP: 158/96  Pulse: 91  Temp: 98.2 F (36.8 C)   Wt Readings from Last 3 Encounters:  02/07/15 117 lb 9.9 oz (53.35 kg)  01/24/15 120 lb (54.432 kg)  12/30/11 131 lb (59.421 kg)    EXAM: Head: Normocephalic and atraumatic.  Eyes:  Conjunctivae are normal. Pupils are equal, round, and reactive to light. No scleral icterus.  Neck:  Normal range of motion. Neck supple. No tracheal deviation present. No thyromegaly present.  Resp: Lungs are clear bilaterally.  No respiratory distress, normal effort. Heart:  Regular without murmurs Neurological: Alert and oriented to person, place, and time. Coordination normal.  Skin: Skin is warm and dry. No rash noted. No diaphoretic. No erythema. No pallor.  Psychiatric: Normal mood and affect. Normal behavior. Judgment and thought content normal.      ASSESSMENT: I have independently reviewed her CT scans. There is a new right upper lobe nodule measuring about 1 cm. I reviewed with her the options of surgical intervention. We also discussed radiation therapy and  radiofrequency ablation. The patient does not wish to pursue any surgical intervention at this time.   PLAN:   We will make an appointment for her to see Dr. Berton Mount to discuss the possibility of stereotactic body frame radiotherapy. She may also be a candidate for radiofrequency ablation. However she does not wish to pursue that at this point in time as well. I will make the referral to Dr. Donella Stade and also obtain her pulmonary function studies from Dr. Yancey Flemings. A return appointment with me was not made today at this time.    Nestor Lewandowsky, MD

## 2015-02-15 ENCOUNTER — Other Ambulatory Visit: Payer: Self-pay | Admitting: Internal Medicine

## 2015-02-15 DIAGNOSIS — C7A090 Malignant carcinoid tumor of the bronchus and lung: Secondary | ICD-10-CM

## 2015-02-20 ENCOUNTER — Institutional Professional Consult (permissible substitution): Payer: Medicaid Other | Admitting: Radiation Oncology

## 2015-02-22 ENCOUNTER — Inpatient Hospital Stay: Payer: Medicaid Other | Attending: Internal Medicine | Admitting: Internal Medicine

## 2015-02-25 ENCOUNTER — Other Ambulatory Visit: Payer: Self-pay

## 2015-02-25 DIAGNOSIS — C3411 Malignant neoplasm of upper lobe, right bronchus or lung: Secondary | ICD-10-CM

## 2015-03-11 ENCOUNTER — Other Ambulatory Visit: Payer: Self-pay | Admitting: *Deleted

## 2015-03-12 ENCOUNTER — Other Ambulatory Visit: Payer: Self-pay | Admitting: Interventional Radiology

## 2015-03-12 ENCOUNTER — Ambulatory Visit
Admission: RE | Admit: 2015-03-12 | Discharge: 2015-03-12 | Disposition: A | Discharge status: Home / Self Care | Payer: Medicaid Other | Source: Ambulatory Visit | Attending: Internal Medicine | Admitting: Internal Medicine

## 2015-03-12 DIAGNOSIS — C3411 Malignant neoplasm of upper lobe, right bronchus or lung: Secondary | ICD-10-CM | POA: Insufficient documentation

## 2015-03-12 NOTE — Consult Note (Signed)
Chief Complaint: Patient was seen in consultation today for management of a hypermetabolic right upper lobe pulmonary nodule at the request of Dr. Ma Hillock.  Chief Complaint  Patient presents with  . Advice Only    Consult for Right upper lobe pulmonary nodule   Referring Physician(s): Pandit,Sandeep  History of Present Illness: Angela Jensen is a 59 y.o. female with a history of adenocarcinoma of the right lung. She is status post core biopsy of 2 separate right upper lobe nodules in May, 2013 revealing well-differentiated adenocarcinoma in both nodules. The patient was treated by Dr. Baruch Gouty with 30 radiation treatments.  Follow-up CT has shown very good results with a more recent CT on 01/24/2015 showing enlargement of a new right upper lobe pulmonary nodule measuring 7 x 9 mm. This was evaluated by PET scan on 01/30/2015 demonstrating an 8 mm hypermetabolic nodule with SUV max of 3.5 consistent with malignancy. No lymph node metastasis or distant metastasis was identified. No hypermetabolic activity was noted in the 2 separate areas of previously treated carcinoma.    Past Medical History  Diagnosis Date  . COPD (chronic obstructive pulmonary disease)   . Hypertension   . Hypercholesteremia   . Asthma   . Cancer 2012    Right Lung CA  . Diabetes mellitus without complication     Patient takes Janumet    Past Surgical History  Procedure Laterality Date  . Cesarean section      x3  . Lung biopsy  05 15 2013    has lung "spots"    Allergies: Review of patient's allergies indicates no known allergies.  Medications: Prior to Admission medications   Medication Sig Start Date End Date Taking? Authorizing Provider  albuterol (PROVENTIL) (2.5 MG/3ML) 0.083% nebulizer solution Take 2.5 mg by nebulization every 6 (six) hours as needed. wheezing   Yes Historical Provider, MD  amLODipine (NORVASC) 10 MG tablet Take 10 mg by mouth daily.   Yes Historical Provider, MD    atorvastatin (LIPITOR) 10 MG tablet Take 10 mg by mouth daily.   Yes Historical Provider, MD  gabapentin (NEURONTIN) 100 MG capsule Take 100 mg by mouth 2 (two) times daily.   Yes Historical Provider, MD  hydrochlorothiazide (HYDRODIURIL) 25 MG tablet Take 25 mg by mouth daily.   Yes Historical Provider, MD  lisinopril (PRINIVIL,ZESTRIL) 10 MG tablet Take 10 mg by mouth daily.   Yes Historical Provider, MD  meloxicam (MOBIC) 7.5 MG tablet Take 7.5 mg by mouth 2 (two) times daily.   Yes Historical Provider, MD  potassium chloride (K-DUR) 10 MEQ tablet Take 10 mEq by mouth every other day.   Yes Historical Provider, MD  rOPINIRole (REQUIP) 0.5 MG tablet Take 0.5 mg by mouth at bedtime.   Yes Historical Provider, MD  rosuvastatin (CRESTOR) 10 MG tablet Take 10 mg by mouth daily.   Yes Historical Provider, MD  SitaGLIPtin-MetFORMIN HCl (JANUMET XR) 50-500 MG TB24 Take by mouth.   Yes Historical Provider, MD  tiotropium (SPIRIVA HANDIHALER) 18 MCG inhalation capsule Place 18 mcg into inhaler and inhale daily.   Yes Historical Provider, MD     No family history on file.  History   Social History  . Marital Status: Widowed    Spouse Name: N/A  . Number of Children: N/A  . Years of Education: N/A   Social History Main Topics  . Smoking status: Former Smoker    Types: Cigarettes    Quit date: 02/06/2010  .  Smokeless tobacco: Not on file  . Alcohol Use: No  . Drug Use: No  . Sexual Activity: Not on file   Other Topics Concern  . Not on file   Social History Narrative    ECOG Status: 0 - Asymptomatic  Review of Systems: A 12 point ROS discussed and pertinent positives are indicated in the HPI above.  All other systems are negative.  Review of Systems  Constitutional: Negative.   Respiratory: Negative.        Some chronic mild dyspnea with exertion.  Uses 2L oxygen by nasal canula at night.  Cardiovascular: Negative.   Gastrointestinal: Negative.   Endocrine: Negative.    Genitourinary: Negative.   Musculoskeletal: Negative.   Skin: Negative.   Allergic/Immunologic: Negative.   Hematological: Negative.     Vital Signs: BP 129/85 mmHg  Pulse 108  Temp(Src) 97.8 F (36.6 C) (Oral)  Resp 14  Ht '4\' 11"'  (1.499 m)  Wt 117 lb (53.071 kg)  BMI 23.62 kg/m2  SpO2 98%  Physical Exam  Constitutional: She is oriented to person, place, and time. No distress.  Neck: No JVD present.  Cardiovascular: Normal rate, regular rhythm and normal heart sounds.  Exam reveals no gallop and no friction rub.   No murmur heard. Pulmonary/Chest: Effort normal and breath sounds normal. No stridor. No respiratory distress. She has no wheezes. She has no rales.  Abdominal: Soft. Bowel sounds are normal. She exhibits no distension and no mass. There is no tenderness. There is no rebound and no guarding.  Musculoskeletal: She exhibits no edema.  Neurological: She is alert and oriented to person, place, and time.  Skin: She is not diaphoretic.  Nursing note and vitals reviewed.    Imaging: No results found.  Labs:  CBC:  Recent Labs  04/17/14 1055  WBC 5.1  HGB 13.3  HCT 42.1  PLT 254    COAGS: No results for input(s): INR, APTT in the last 8760 hours.  BMP:  Recent Labs  04/17/14 1055  NA 141  K 3.9  CL 102  CO2 30  GLUCOSE 201*  BUN 10  CALCIUM 9.1  CREATININE 0.90  GFRNONAA >60  GFRAA >60    LIVER FUNCTION TESTS:  Recent Labs  04/17/14 1055  BILITOT 0.4  AST 27  ALT 29  ALKPHOS 73  PROT 8.1  ALBUMIN 4.1    TUMOR MARKERS: No results for input(s): AFPTM, CEA, CA199, CHROMGRNA in the last 8760 hours.  Assessment and Plan:  I met with Angela Jensen and her long-time partner Angela Jensen, Angela Jensen.  I actually performed her prior right lung nodule biopsies in 2013.  We reviewed imaging studies including the recent PET scan. The new 8-9 mm right upper lobe nodule is almost certainly a new carcinoma, likely adenocarcinoma given her prior malignancy. This  lesion is amenable in size and location to percutaneous thermal ablation. I reviewed details of ablation with Angela Jensen including risks. I do not think that separate biopsy of the nodule is necessary prior to treatment given PET scan results.  I recommended that Angela Jensen consult Dr. Baruch Gouty regarding radiation therapy of the new lesion. If he feels that she cannot receive more radiation dose to the right lung/chest, I believe that Angela Jensen would be an excellent candidate for CT-guided thermal ablation of the lesion. Her COPD appears to be under good control and she would be at fairly low risk of placing under general anesthesia.  I will await Dr. Olena Leatherwood recommendation.  Thank  you for this interesting consult.  I greatly enjoyed meeting Angela Jensen and look forward to participating in their care.  A copy of this report was sent to the requesting provider on this date.   SignedAletta Edouard T 03/12/2015, 10:08 AM     I spent a total of 40 Minutes in face to face in clinical consultation, greater than 50% of which was counseling/coordinating care for treatment of a right upper lobe lung nodule.

## 2015-03-26 ENCOUNTER — Encounter: Payer: Self-pay | Admitting: *Deleted

## 2015-03-27 ENCOUNTER — Other Ambulatory Visit: Payer: Self-pay | Admitting: Interventional Radiology

## 2015-03-27 DIAGNOSIS — C3491 Malignant neoplasm of unspecified part of right bronchus or lung: Secondary | ICD-10-CM

## 2015-04-19 ENCOUNTER — Inpatient Hospital Stay (HOSPITAL_BASED_OUTPATIENT_CLINIC_OR_DEPARTMENT_OTHER): Payer: Medicaid Other | Admitting: Internal Medicine

## 2015-04-19 ENCOUNTER — Inpatient Hospital Stay: Payer: Medicaid Other | Attending: Internal Medicine

## 2015-04-19 VITALS — BP 99/62 | HR 85 | Temp 98.0°F | Resp 18 | Ht 59.0 in | Wt 118.6 lb

## 2015-04-19 DIAGNOSIS — F329 Major depressive disorder, single episode, unspecified: Secondary | ICD-10-CM | POA: Diagnosis not present

## 2015-04-19 DIAGNOSIS — E780 Pure hypercholesterolemia: Secondary | ICD-10-CM | POA: Insufficient documentation

## 2015-04-19 DIAGNOSIS — R918 Other nonspecific abnormal finding of lung field: Secondary | ICD-10-CM

## 2015-04-19 DIAGNOSIS — C3411 Malignant neoplasm of upper lobe, right bronchus or lung: Secondary | ICD-10-CM | POA: Insufficient documentation

## 2015-04-19 DIAGNOSIS — Z79899 Other long term (current) drug therapy: Secondary | ICD-10-CM | POA: Diagnosis not present

## 2015-04-19 DIAGNOSIS — I10 Essential (primary) hypertension: Secondary | ICD-10-CM

## 2015-04-19 DIAGNOSIS — Z87891 Personal history of nicotine dependence: Secondary | ICD-10-CM

## 2015-04-19 DIAGNOSIS — R05 Cough: Secondary | ICD-10-CM

## 2015-04-19 DIAGNOSIS — M1990 Unspecified osteoarthritis, unspecified site: Secondary | ICD-10-CM | POA: Insufficient documentation

## 2015-04-19 DIAGNOSIS — C3491 Malignant neoplasm of unspecified part of right bronchus or lung: Secondary | ICD-10-CM

## 2015-04-19 DIAGNOSIS — E119 Type 2 diabetes mellitus without complications: Secondary | ICD-10-CM | POA: Insufficient documentation

## 2015-04-19 DIAGNOSIS — Z923 Personal history of irradiation: Secondary | ICD-10-CM

## 2015-04-19 DIAGNOSIS — Z9981 Dependence on supplemental oxygen: Secondary | ICD-10-CM | POA: Insufficient documentation

## 2015-04-19 DIAGNOSIS — J449 Chronic obstructive pulmonary disease, unspecified: Secondary | ICD-10-CM | POA: Insufficient documentation

## 2015-04-19 LAB — HEPATIC FUNCTION PANEL
ALT: 16 U/L (ref 14–54)
AST: 22 U/L (ref 15–41)
Albumin: 4.1 g/dL (ref 3.5–5.0)
Alkaline Phosphatase: 52 U/L (ref 38–126)
Total Bilirubin: 0.6 mg/dL (ref 0.3–1.2)
Total Protein: 7.2 g/dL (ref 6.5–8.1)

## 2015-04-19 LAB — CBC WITH DIFFERENTIAL/PLATELET
Basophils Absolute: 0 10*3/uL (ref 0–0.1)
Basophils Relative: 1 %
EOS PCT: 3 %
Eosinophils Absolute: 0.1 10*3/uL (ref 0–0.7)
HCT: 35.8 % (ref 35.0–47.0)
Hemoglobin: 11.8 g/dL — ABNORMAL LOW (ref 12.0–16.0)
LYMPHS ABS: 1.4 10*3/uL (ref 1.0–3.6)
LYMPHS PCT: 29 %
MCH: 29 pg (ref 26.0–34.0)
MCHC: 32.9 g/dL (ref 32.0–36.0)
MCV: 88 fL (ref 80.0–100.0)
MONO ABS: 0.5 10*3/uL (ref 0.2–0.9)
Monocytes Relative: 10 %
Neutro Abs: 2.8 10*3/uL (ref 1.4–6.5)
Neutrophils Relative %: 57 %
Platelets: 223 10*3/uL (ref 150–440)
RBC: 4.07 MIL/uL (ref 3.80–5.20)
RDW: 13.4 % (ref 11.5–14.5)
WBC: 4.9 10*3/uL (ref 3.6–11.0)

## 2015-04-19 LAB — BASIC METABOLIC PANEL
Anion gap: 6 (ref 5–15)
BUN: 18 mg/dL (ref 6–20)
CALCIUM: 8.3 mg/dL — AB (ref 8.9–10.3)
CO2: 27 mmol/L (ref 22–32)
Chloride: 99 mmol/L — ABNORMAL LOW (ref 101–111)
Creatinine, Ser: 0.95 mg/dL (ref 0.44–1.00)
GFR calc Af Amer: >60 mL/min (ref >60)
GFR calc non Af Amer: >60 mL/min (ref >60)
GLUCOSE: 131 mg/dL — AB (ref 65–99)
Potassium: 3.4 mmol/L — ABNORMAL LOW (ref 3.5–5.1)
Sodium: 132 mmol/L — ABNORMAL LOW (ref 135–145)

## 2015-04-19 NOTE — Progress Notes (Signed)
04-19-15 - Please put in orders for patients ablation scheduled for 05-03-15.  Her preop appt. Is on 04-24-15.  Thank you

## 2015-04-19 NOTE — Progress Notes (Signed)
Patient states that she has been doing pretty good. She is scheduled for a CT tissue ablation by Dr. Kathlene Cote on 05/03/15.

## 2015-04-19 NOTE — Patient Instructions (Signed)
Angela Jensen  04/19/2015   Your procedure is scheduled on:   Report to [facility] Main  Entrance and check in at Radiology at 6:30 AM.  Then take [place] to 3rd floor to  [employer] .               Call this number if you have problems the morning of surgery [phone].   Remember: ONLY 1 PERSON MAY GO WITH YOU TO SHORT STAY TO GET  READY MORNING OF YOUR SURGERY.  Do not eat food or drink liquids :After Midnight.     Take these medicines the morning of surgery with A SIP OF WATER: Albuterol, Amlodipine (Norvasc), Wellbutrin, Gabapentin, Dulera, Spiriva, Ropinirole (requip)                               You may not have any metal on your body including hair pins and              piercings  Do not wear jewelry, make-up, lotions, powders or perfumes, deodorant             Do not wear nail polish.  Do not shave  48 hours prior to surgery. .   Do not bring valuables to the hospital. Angela Jensen.  Contacts, dentures or bridgework may not be worn into surgery.  Leave suitcase in the car. After surgery it may be brought to your room.        Special Instructions: coughing and deep breathing exercises, leg exercises              Please read over the following fact sheets you were given: _____________________________________________________________________             [facility] - Preparing for Surgery Before surgery, you can play an important role.  Because skin is not sterile, your skin needs to be as free of germs as possible.  You can reduce the number of germs on your skin by washing with CHG (chlorahexidine gluconate) soap before surgery.  CHG is an antiseptic cleaner which kills germs and bonds with the skin to continue killing germs even after washing. Please DO NOT use if you have an allergy to CHG or antibacterial soaps.  If your skin becomes reddened/irritated stop using the CHG and inform your  nurse when you arrive at Short Stay. Do not shave (including legs and underarms) for at least 48 hours prior to the first CHG shower.  You may shave your face/neck. Please follow these instructions carefully:  1.  Shower with CHG Soap the night before surgery and the  morning of Surgery.  2.  If you choose to wash your hair, wash your hair first as usual with your  normal  shampoo.  3.  After you shampoo, rinse your hair and body thoroughly to remove the  shampoo.                           4.  Use CHG as you would any other liquid soap.  You can apply chg directly  to the skin and wash  Gently with a scrungie or clean washcloth.  5.  Apply the CHG Soap to your body ONLY FROM THE NECK DOWN.   Do not use on face/ open                           Wound or open sores. Avoid contact with eyes, ears mouth and genitals (private parts).                       Wash face,  Genitals (private parts) with your normal soap.             6.  Wash thoroughly, paying special attention to the area where your surgery  will be performed.  7.  Thoroughly rinse your body with warm water from the neck down.  8.  DO NOT shower/wash with your normal soap after using and rinsing off  the CHG Soap.                9.  Pat yourself dry with a clean towel.            10.  Wear clean pajamas.            11.  Place clean sheets on your bed the night of your first shower and do not  sleep with pets. Day of Surgery : Do not apply any lotions/deodorants the morning of surgery.  Please wear clean clothes to the hospital/surgery Jensen.  FAILURE TO FOLLOW THESE INSTRUCTIONS MAY RESULT IN THE CANCELLATION OF YOUR SURGERY PATIENT SIGNATURE_________________________________  NURSE SIGNATURE__________________________________  ________________________________________________________________________

## 2015-04-21 ENCOUNTER — Other Ambulatory Visit: Payer: Self-pay | Admitting: Radiology

## 2015-04-23 ENCOUNTER — Encounter (HOSPITAL_COMMUNITY): Payer: Self-pay | Admitting: Pharmacist

## 2015-04-23 ENCOUNTER — Telehealth: Payer: Self-pay | Admitting: Gastroenterology

## 2015-04-23 NOTE — Telephone Encounter (Signed)
Colonoscopy triage °

## 2015-04-24 ENCOUNTER — Inpatient Hospital Stay (HOSPITAL_COMMUNITY)
Admission: RE | Admit: 2015-04-24 | Discharge: 2015-04-24 | Disposition: A | Discharge status: Home / Self Care | Payer: Medicaid Other | Source: Ambulatory Visit

## 2015-04-26 ENCOUNTER — Other Ambulatory Visit (HOSPITAL_COMMUNITY): Payer: Medicaid Other

## 2015-04-29 NOTE — Patient Instructions (Signed)
Angela Jensen  04/29/2015   Your procedure is scheduled on:    05/03/2015    Report to [facility] Main  Entrance .  Report to Radiology first on the first floor then you will go to Short Stay which is on the 3rd floor by taking the [employer].  Report at 0630am.    Call this number if you have problems the morning of surgery [phone]   Remember: ONLY 1 PERSON MAY GO WITH YOU TO SHORT STAY TO GET  READY MORNING OF YOUR SURGERY.  Do not eat food or drink liquids :After Midnight.     Take these medicines the morning of surgery with A SIP OF WATER:   Albuterol Inhaler if needed and bring, Albuterol nebulizer if needed, Amlodipine ( NORvasc), Wellbutrin, Dulera, Spiriva, Requip                                You may not have any metal on your body including hair pins and              piercings  Do not wear jewelry, make-up, lotions, powders or perfumes, deodorant             Do not wear nail polish.  Do not shave  48 hours prior to surgery.                 Do not bring valuables to the hospital. Angela Jensen.  Contacts, dentures or bridgework may not be worn into surgery.  Leave suitcase in the car. After surgery it may be brought to your room.       Special Instructions: coughing and deep breathing exercises, leg exercises               Please read over the following fact sheets you were given: _____________________________________________________________________             [facility] - Preparing for Surgery Before surgery, you can play an important role.  Because skin is not sterile, your skin needs to be as free of germs as possible.  You can reduce the number of germs on your skin by washing with CHG (chlorahexidine gluconate) soap before surgery.  CHG is an antiseptic cleaner which kills germs and bonds with the skin to continue killing germs even after washing. Please DO NOT use if you have an allergy to  CHG or antibacterial soaps.  If your skin becomes reddened/irritated stop using the CHG and inform your nurse when you arrive at Short Stay. Do not shave (including legs and underarms) for at least 48 hours prior to the first CHG shower.  You may shave your face/neck. Please follow these instructions carefully:  1.  Shower with CHG Soap the night before surgery and the  morning of Surgery.  2.  If you choose to wash your hair, wash your hair first as usual with your  normal  shampoo.  3.  After you shampoo, rinse your hair and body thoroughly to remove the  shampoo.                           4.  Use CHG as you would any other liquid soap.  You can apply chg directly  to the skin and wash                       Gently with a scrungie or clean washcloth.  5.  Apply the CHG Soap to your body ONLY FROM THE NECK DOWN.   Do not use on face/ open                           Wound or open sores. Avoid contact with eyes, ears mouth and genitals (private parts).                       Wash face,  Genitals (private parts) with your normal soap.             6.  Wash thoroughly, paying special attention to the area where your surgery  will be performed.  7.  Thoroughly rinse your body with warm water from the neck down.  8.  DO NOT shower/wash with your normal soap after using and rinsing off  the CHG Soap.                9.  Pat yourself dry with a clean towel.            10.  Wear clean pajamas.            11.  Place clean sheets on your bed the night of your first shower and do not  sleep with pets. Day of Surgery : Do not apply any lotions/deodorants the morning of surgery.  Please wear clean clothes to the hospital/surgery center.  FAILURE TO FOLLOW THESE INSTRUCTIONS MAY RESULT IN THE CANCELLATION OF YOUR SURGERY PATIENT SIGNATURE_________________________________  NURSE SIGNATURE__________________________________  ________________________________________________________________________  WHAT IS A  BLOOD TRANSFUSION? Blood Transfusion Information  A transfusion is the replacement of blood or some of its parts. Blood is made up of multiple cells which provide different functions.  Red blood cells carry oxygen and are used for blood loss replacement.  White blood cells fight against infection.  Platelets control bleeding.  Plasma helps clot blood.  Other blood products are available for specialized needs, such as hemophilia or other clotting disorders. BEFORE THE TRANSFUSION  Who gives blood for transfusions?   Healthy volunteers who are fully evaluated to make sure their blood is safe. This is blood bank blood. Transfusion therapy is the safest it has ever been in the practice of medicine. Before blood is taken from a donor, a complete history is taken to make sure that person has no history of diseases nor engages in risky social behavior (examples are intravenous drug use or sexual activity with multiple partners). The donor's travel history is screened to minimize risk of transmitting infections, such as malaria. The donated blood is tested for signs of infectious diseases, such as HIV and hepatitis. The blood is then tested to be sure it is compatible with you in order to minimize the chance of a transfusion reaction. If you or a relative donates blood, this is often done in anticipation of surgery and is not appropriate for emergency situations. It takes many days to process the donated blood. RISKS AND COMPLICATIONS Although transfusion therapy is very safe and saves many lives, the main dangers of transfusion include:   Getting an infectious disease.  Developing a transfusion reaction. This is an allergic reaction to something in the blood you were given.  Every precaution is taken to prevent this. The decision to have a blood transfusion has been considered carefully by your caregiver before blood is given. Blood is not given unless the benefits outweigh the risks. AFTER THE  TRANSFUSION  Right after receiving a blood transfusion, you will usually feel much better and more energetic. This is especially true if your red blood cells have gotten low (anemic). The transfusion raises the level of the red blood cells which carry oxygen, and this usually causes an energy increase.  The nurse administering the transfusion will monitor you carefully for complications. HOME CARE INSTRUCTIONS  No special instructions are needed after a transfusion. You may find your energy is better. Speak with your caregiver about any limitations on activity for underlying diseases you may have. SEEK MEDICAL CARE IF:   Your condition is not improving after your transfusion.  You develop redness or irritation at the intravenous (IV) site. SEEK IMMEDIATE MEDICAL CARE IF:  Any of the following symptoms occur over the next 12 hours:  Shaking chills.  You have a temperature by mouth above 102 F (38.9 C), not controlled by medicine.  Chest, back, or muscle pain.  People around you feel you are not acting correctly or are confused.  Shortness of breath or difficulty breathing.  Dizziness and fainting.  You get a rash or develop hives.  You have a decrease in urine output.  Your urine turns a dark color or changes to pink, red, or brown. Any of the following symptoms occur over the next 10 days:  You have a temperature by mouth above 102 F (38.9 C), not controlled by medicine.  Shortness of breath.  Weakness after normal activity.  The white part of the eye turns yellow (jaundice).  You have a decrease in the amount of urine or are urinating less often.  Your urine turns a dark color or changes to pink, red, or brown. Document Released: 07/31/2000 Document Revised: 10/26/2011 Document Reviewed: 03/19/2008 [facility] Patient Information 2014 [place], [place].  _______________________________________________________________________

## 2015-04-30 ENCOUNTER — Other Ambulatory Visit: Payer: Self-pay

## 2015-04-30 ENCOUNTER — Encounter (HOSPITAL_COMMUNITY): Payer: Self-pay

## 2015-04-30 ENCOUNTER — Encounter (HOSPITAL_COMMUNITY)
Admission: RE | Admit: 2015-04-30 | Discharge: 2015-04-30 | Disposition: A | Discharge status: Home / Self Care | Payer: Medicaid Other | Source: Ambulatory Visit | Attending: Interventional Radiology | Admitting: Interventional Radiology

## 2015-04-30 ENCOUNTER — Ambulatory Visit (HOSPITAL_COMMUNITY)
Admission: RE | Admit: 2015-04-30 | Discharge: 2015-04-30 | Disposition: A | Discharge status: Home / Self Care | Payer: Medicaid Other | Source: Ambulatory Visit | Attending: Interventional Radiology | Admitting: Interventional Radiology

## 2015-04-30 ENCOUNTER — Other Ambulatory Visit: Payer: Self-pay | Admitting: Radiology

## 2015-04-30 DIAGNOSIS — Z0183 Encounter for blood typing: Secondary | ICD-10-CM | POA: Insufficient documentation

## 2015-04-30 DIAGNOSIS — Z01812 Encounter for preprocedural laboratory examination: Secondary | ICD-10-CM | POA: Diagnosis not present

## 2015-04-30 DIAGNOSIS — R911 Solitary pulmonary nodule: Secondary | ICD-10-CM | POA: Diagnosis not present

## 2015-04-30 DIAGNOSIS — Z01818 Encounter for other preprocedural examination: Secondary | ICD-10-CM

## 2015-04-30 DIAGNOSIS — Z01810 Encounter for preprocedural cardiovascular examination: Secondary | ICD-10-CM | POA: Insufficient documentation

## 2015-04-30 DIAGNOSIS — Z85118 Personal history of other malignant neoplasm of bronchus and lung: Secondary | ICD-10-CM | POA: Insufficient documentation

## 2015-04-30 DIAGNOSIS — J45909 Unspecified asthma, uncomplicated: Secondary | ICD-10-CM | POA: Insufficient documentation

## 2015-04-30 HISTORY — DX: Dependence on supplemental oxygen: Z99.81

## 2015-04-30 HISTORY — DX: Reserved for inherently not codable concepts without codable children: IMO0001

## 2015-04-30 HISTORY — DX: Unspecified osteoarthritis, unspecified site: M19.90

## 2015-04-30 HISTORY — DX: Major depressive disorder, single episode, unspecified: F32.9

## 2015-04-30 HISTORY — DX: Depression, unspecified: F32.A

## 2015-04-30 LAB — CBC WITH DIFFERENTIAL/PLATELET
BASOS PCT: 0 % (ref 0–1)
Basophils Absolute: 0 10*3/uL (ref 0.0–0.1)
EOS ABS: 0.1 10*3/uL (ref 0.0–0.7)
EOS PCT: 2 % (ref 0–5)
HCT: 40.8 % (ref 36.0–46.0)
HEMOGLOBIN: 12.8 g/dL (ref 12.0–15.0)
LYMPHS ABS: 1.2 10*3/uL (ref 0.7–4.0)
Lymphocytes Relative: 26 % (ref 12–46)
MCH: 29 pg (ref 26.0–34.0)
MCHC: 31.4 g/dL (ref 30.0–36.0)
MCV: 92.3 fL (ref 78.0–100.0)
MONOS PCT: 7 % (ref 3–12)
Monocytes Absolute: 0.3 10*3/uL (ref 0.1–1.0)
Neutro Abs: 2.9 10*3/uL (ref 1.7–7.7)
Neutrophils Relative %: 65 % (ref 43–77)
Platelets: 242 10*3/uL (ref 150–400)
RBC: 4.42 MIL/uL (ref 3.87–5.11)
RDW: 13.7 % (ref 11.5–15.5)
WBC: 4.6 10*3/uL (ref 4.0–10.5)

## 2015-04-30 LAB — BASIC METABOLIC PANEL
Anion gap: 6 (ref 5–15)
BUN: 11 mg/dL (ref 6–20)
CHLORIDE: 105 mmol/L (ref 101–111)
CO2: 29 mmol/L (ref 22–32)
Calcium: 9.6 mg/dL (ref 8.9–10.3)
Creatinine, Ser: 0.77 mg/dL (ref 0.44–1.00)
GFR calc Af Amer: >60 mL/min (ref >60)
GFR calc non Af Amer: >60 mL/min (ref >60)
GLUCOSE: 101 mg/dL — AB (ref 65–99)
POTASSIUM: 4.1 mmol/L (ref 3.5–5.1)
Sodium: 140 mmol/L (ref 135–145)

## 2015-04-30 LAB — PROTIME-INR
INR: 0.98 (ref 0.00–1.49)
PROTHROMBIN TIME: 13.2 s (ref 11.6–15.2)

## 2015-04-30 LAB — APTT: aPTT: 33 seconds (ref 24–37)

## 2015-04-30 NOTE — Telephone Encounter (Signed)
Phoned patient No answer and cant leave Voice mail will mail letter

## 2015-04-30 NOTE — Progress Notes (Signed)
01/24/15- CT chest- EP[facility]

## 2015-05-01 LAB — ABO/RH: ABO/RH(D): O POS

## 2015-05-01 NOTE — Progress Notes (Signed)
Final EKG in EPIC done 04/30/15.

## 2015-05-02 ENCOUNTER — Other Ambulatory Visit: Payer: Self-pay

## 2015-05-02 NOTE — Telephone Encounter (Signed)
Gastroenterology Pre-Procedure Review  Request Date: 06/25/15 Requesting Physician: Leretha Pol, NP  PATIENT REVIEW QUESTIONS: The patient responded to the following health history questions as indicated:    1. Are you having any GI issues? no 2. Do you have a personal history of Polyps? no 3. Do you have a family history of Colon Cancer or Polyps? no 4. Diabetes Mellitus? yes (Type 2) 5. Joint replacements in the past 12 months?no 6. Major health problems in the past 3 months?no 7. Any artificial heart valves, MVP, or defibrillator?no    MEDICATIONS & ALLERGIES:    Patient reports the following regarding taking any anticoagulation/antiplatelet therapy:   Plavix, Coumadin, Eliquis, Xarelto, Lovenox, Pradaxa, Brilinta, or Effient? no Aspirin? no  Patient confirms/reports the following medications:  Current Outpatient Prescriptions  Medication Sig Dispense Refill  . albuterol (PROAIR HFA) 108 (90 BASE) MCG/ACT inhaler Inhale 2 puffs into the lungs every 4 (four) hours as needed for wheezing or shortness of breath.     Marland Kitchen albuterol (PROVENTIL) (2.5 MG/3ML) 0.083% nebulizer solution Take 2.5 mg by nebulization every 6 (six) hours as needed for wheezing.     Marland Kitchen amLODipine (NORVASC) 10 MG tablet Take 10 mg by mouth every morning.     Marland Kitchen buPROPion (WELLBUTRIN) 75 MG tablet Take 75 mg by mouth 2 (two) times daily.    Marland Kitchen gabapentin (NEURONTIN) 100 MG capsule Take 100 mg by mouth 2 (two) times daily.    Marland Kitchen lisinopril-hydrochlorothiazide (PRINZIDE,ZESTORETIC) 10-12.5 MG per tablet Take 1 tablet by mouth every morning.    . meloxicam (MOBIC) 7.5 MG tablet Take 7.5 mg by mouth 2 (two) times daily.    . mometasone-formoterol (DULERA) 200-5 MCG/ACT AERO Inhale 1 puff into the lungs 2 (two) times daily.    . potassium chloride (K-DUR) 10 MEQ tablet Take 10 mEq by mouth every other day. Instructed to take 4mq daily from 9/6 - 9/11 then resume taking 142m every other day.    . Marland KitchenOPINIRole (REQUIP) 0.5  MG tablet Take 0.5 mg by mouth 2 (two) times daily.     . rosuvastatin (CRESTOR) 10 MG tablet Take 10 mg by mouth at bedtime.     . SitaGLIPtin-MetFORMIN HCl (JANUMET XR) 50-500 MG TB24 Take 1 tablet by mouth daily with breakfast.     . tiotropium (SPIRIVA HANDIHALER) 18 MCG inhalation capsule Place 18 mcg into inhaler and inhale daily.     No current facility-administered medications for this visit.    Patient confirms/reports the following allergies:  No Known Allergies  No orders of the defined types were placed in this encounter.    AUTHORIZATION INFORMATION Primary Insurance: 1D#: Group #:  Secondary Insurance: 1D#: Group #:  SCHEDULE INFORMATION: Date: 06/25/15 Time: [place]: [place]

## 2015-05-03 ENCOUNTER — Encounter (HOSPITAL_COMMUNITY): Payer: Self-pay | Admitting: Anesthesiology

## 2015-05-03 ENCOUNTER — Ambulatory Visit (HOSPITAL_COMMUNITY)
Admission: RE | Admit: 2015-05-03 | Discharge: 2015-05-03 | Disposition: A | Discharge status: Home / Self Care | Payer: Medicaid Other | Source: Ambulatory Visit | Attending: Interventional Radiology | Admitting: Interventional Radiology

## 2015-05-03 ENCOUNTER — Ambulatory Visit (HOSPITAL_COMMUNITY): Payer: Medicaid Other | Admitting: Certified Registered Nurse Anesthetist

## 2015-05-03 ENCOUNTER — Encounter (HOSPITAL_COMMUNITY)
Admission: RE | Disposition: A | Discharge status: Home / Self Care | Payer: Self-pay | Source: Ambulatory Visit | Attending: Interventional Radiology

## 2015-05-03 DIAGNOSIS — Z923 Personal history of irradiation: Secondary | ICD-10-CM | POA: Insufficient documentation

## 2015-05-03 DIAGNOSIS — J449 Chronic obstructive pulmonary disease, unspecified: Secondary | ICD-10-CM | POA: Insufficient documentation

## 2015-05-03 DIAGNOSIS — Z85118 Personal history of other malignant neoplasm of bronchus and lung: Secondary | ICD-10-CM | POA: Diagnosis not present

## 2015-05-03 DIAGNOSIS — Z87891 Personal history of nicotine dependence: Secondary | ICD-10-CM | POA: Diagnosis not present

## 2015-05-03 DIAGNOSIS — Z9981 Dependence on supplemental oxygen: Secondary | ICD-10-CM | POA: Insufficient documentation

## 2015-05-03 DIAGNOSIS — E119 Type 2 diabetes mellitus without complications: Secondary | ICD-10-CM | POA: Insufficient documentation

## 2015-05-03 DIAGNOSIS — J45909 Unspecified asthma, uncomplicated: Secondary | ICD-10-CM | POA: Diagnosis not present

## 2015-05-03 DIAGNOSIS — Z791 Long term (current) use of non-steroidal anti-inflammatories (NSAID): Secondary | ICD-10-CM | POA: Insufficient documentation

## 2015-05-03 DIAGNOSIS — M1990 Unspecified osteoarthritis, unspecified site: Secondary | ICD-10-CM | POA: Diagnosis not present

## 2015-05-03 DIAGNOSIS — Z7951 Long term (current) use of inhaled steroids: Secondary | ICD-10-CM | POA: Insufficient documentation

## 2015-05-03 DIAGNOSIS — I10 Essential (primary) hypertension: Secondary | ICD-10-CM | POA: Diagnosis not present

## 2015-05-03 DIAGNOSIS — Z79899 Other long term (current) drug therapy: Secondary | ICD-10-CM | POA: Diagnosis not present

## 2015-05-03 DIAGNOSIS — Z538 Procedure and treatment not carried out for other reasons: Secondary | ICD-10-CM | POA: Diagnosis not present

## 2015-05-03 DIAGNOSIS — R911 Solitary pulmonary nodule: Secondary | ICD-10-CM | POA: Insufficient documentation

## 2015-05-03 DIAGNOSIS — C3491 Malignant neoplasm of unspecified part of right bronchus or lung: Secondary | ICD-10-CM

## 2015-05-03 DIAGNOSIS — C3490 Malignant neoplasm of unspecified part of unspecified bronchus or lung: Secondary | ICD-10-CM | POA: Insufficient documentation

## 2015-05-03 LAB — TYPE AND SCREEN
ABO/RH(D): O POS
Antibody Screen: NEGATIVE

## 2015-05-03 LAB — GLUCOSE, CAPILLARY
GLUCOSE-CAPILLARY: 126 mg/dL — AB (ref 65–99)
Glucose-Capillary: 114 mg/dL — ABNORMAL HIGH (ref 65–99)

## 2015-05-03 SURGERY — RADIOLOGY WITH ANESTHESIA
Anesthesia: General | Laterality: Right

## 2015-05-03 MED ORDER — PROPOFOL 10 MG/ML IV BOLUS
INTRAVENOUS | Status: AC
  Filled 2015-05-03: qty 20

## 2015-05-03 MED ORDER — SUGAMMADEX SODIUM 200 MG/2ML IV SOLN
INTRAVENOUS | Status: AC
  Filled 2015-05-03: qty 2

## 2015-05-03 MED ORDER — LIDOCAINE HCL (CARDIAC) 20 MG/ML IV SOLN
INTRAVENOUS | Status: DC | PRN
  Administered 2015-05-03: 50 mg via INTRAVENOUS

## 2015-05-03 MED ORDER — PHENYLEPHRINE HCL 10 MG/ML IJ SOLN
INTRAMUSCULAR | Status: DC | PRN
  Administered 2015-05-03 (×2): 40 ug via INTRAVENOUS

## 2015-05-03 MED ORDER — PROPOFOL 10 MG/ML IV BOLUS
INTRAVENOUS | Status: DC | PRN
  Administered 2015-05-03: 80 mg via INTRAVENOUS

## 2015-05-03 MED ORDER — FENTANYL CITRATE (PF) 100 MCG/2ML IJ SOLN
INTRAMUSCULAR | Status: DC | PRN
  Administered 2015-05-03 (×2): 50 ug via INTRAVENOUS

## 2015-05-03 MED ORDER — SUGAMMADEX SODIUM 200 MG/2ML IV SOLN
INTRAVENOUS | Status: DC | PRN
  Administered 2015-05-03: 100 mg via INTRAVENOUS

## 2015-05-03 MED ORDER — MIDAZOLAM HCL 5 MG/5ML IJ SOLN
INTRAMUSCULAR | Status: DC | PRN
  Administered 2015-05-03: 2 mg via INTRAVENOUS

## 2015-05-03 MED ORDER — ROCURONIUM BROMIDE 100 MG/10ML IV SOLN
INTRAVENOUS | Status: DC | PRN
  Administered 2015-05-03: 20 mg via INTRAVENOUS

## 2015-05-03 MED ORDER — SODIUM CHLORIDE 0.9 % IV SOLN: INTRAVENOUS | Status: DC

## 2015-05-03 MED ORDER — FENTANYL CITRATE (PF) 100 MCG/2ML IJ SOLN: 25.0000 ug | INTRAMUSCULAR | Status: DC | PRN

## 2015-05-03 MED ORDER — LACTATED RINGERS IV SOLN
INTRAVENOUS | Status: DC
  Administered 2015-05-03 (×2): via INTRAVENOUS

## 2015-05-03 MED ORDER — LEVOFLOXACIN IN D5W 500 MG/100ML IV SOLN
500.0000 mg | Freq: Once | INTRAVENOUS | Status: AC
  Administered 2015-05-03: 500 mg via INTRAVENOUS
  Filled 2015-05-03 (×2): qty 100

## 2015-05-03 MED ORDER — ONDANSETRON HCL 4 MG/2ML IJ SOLN: 4.0000 mg | Freq: Once | INTRAMUSCULAR | Status: DC | PRN

## 2015-05-03 MED ORDER — FENTANYL CITRATE (PF) 250 MCG/5ML IJ SOLN
INTRAMUSCULAR | Status: AC
Start: 2015-05-03 — End: 2015-05-03
  Filled 2015-05-03: qty 25

## 2015-05-03 MED ORDER — PHENYLEPHRINE HCL 10 MG/ML IJ SOLN
10.0000 mg | INTRAVENOUS | Status: DC | PRN
  Administered 2015-05-03: 40 ug/min via INTRAVENOUS

## 2015-05-03 MED ORDER — HYDROMORPHONE HCL 2 MG/ML IJ SOLN
INTRAMUSCULAR | Status: AC
  Filled 2015-05-03: qty 1

## 2015-05-03 MED ORDER — MIDAZOLAM HCL 2 MG/2ML IJ SOLN
INTRAMUSCULAR | Status: AC
  Filled 2015-05-03: qty 4

## 2015-05-03 MED ORDER — SUCCINYLCHOLINE CHLORIDE 20 MG/ML IJ SOLN
INTRAMUSCULAR | Status: DC | PRN
  Administered 2015-05-03: 80 mg via INTRAVENOUS

## 2015-05-03 NOTE — Procedures (Signed)
Interventional Radiology Procedure Note  Procedure:  Aborted right lung thermal ablation; CT of chest w/o contrast Findings:  CT performed for localization prior to planned microwave thermal ablation of 9 mm RUL hypermetabolic lung nodule shows that the nodule has nearly disappeared w/ tiny 2-3 mm nodular area remaining.  Findings consistent with resolving inflammatory nodule.  Planned ablation cancelled. Recommendations: Follow up with [facility].  Will discharge to home later today from Short Stay.  Venetia Night. Kathlene Cote, M.D Pager:  [phone]

## 2015-05-03 NOTE — Transfer of Care (Signed)
Immediate Anesthesia Transfer of Care Note  Patient: Angela Jensen  Procedure(s) Performed: Procedure(s): RIGHT RADIO FREQUENCY ABLATION (Right)  Patient Location: PACU  Anesthesia Type:General  Level of Consciousness:  sedated, patient cooperative and responds to stimulation  Airway & Oxygen Therapy:Patient Spontanous Breathing and Patient connected to face mask oxgen  Post-op Assessment:  Report given to PACU RN and Post -op Vital signs reviewed and stable  Post vital signs:  Reviewed and stable  Last Vitals:  Filed Vitals:   05/03/15 0640  BP: 124/83  Pulse: 87  Temp: 36.4 C  Resp: 20    Complications: No apparent anesthesia complications

## 2015-05-03 NOTE — Anesthesia Preprocedure Evaluation (Deleted)
Anesthesia Evaluation    Airway        Dental   Pulmonary former smoker,           Cardiovascular hypertension,      Neuro/Psych    GI/Hepatic   Endo/Other  diabetes  Renal/GU      Musculoskeletal   Abdominal   Peds  Hematology   Anesthesia Other Findings   Reproductive/Obstetrics                             Anesthesia Physical Anesthesia Plan Anesthesia Quick Evaluation  

## 2015-05-03 NOTE — Progress Notes (Signed)
[name], female   DOB: [birth year], 59 y.o.   MRN: [id number]    Referring Physician(s): Pandit,S  Chief Complaint: Right lung cancer  Subjective: Angela Jensen is a 59 y.o. female with a history of adenocarcinoma of the right lung. She is status post core biopsy of 2 separate right upper lobe nodules in May, 2013 revealing well-differentiated adenocarcinoma in both nodules. The patient was treated by Dr. Baruch Gouty with 30 radiation treatments. Follow-up CT has shown very good results with a more recent CT on 01/24/2015 showing enlargement of a new right upper lobe pulmonary nodule measuring 7 x 9 mm. This was evaluated by PET scan on 01/30/2015 demonstrating an 8 mm hypermetabolic nodule with SUV max of 3.5 consistent with malignancy. No lymph node metastasis or distant metastasis was identified. No hypermetabolic activity was noted in the 2 separate areas of previously treated carcinoma. She was seen recently in consultation by Dr. Kathlene Cote on 03/12/15 and deemed an appropriate candidate for thermal ablation of the hypermetabolic right upper lobe lung nodule. She presents today for the above procedure. She is currently asymptomatic.   Allergies: Review of patient's allergies indicates no known allergies.  Medications: Prior to Admission medications   Medication Sig Start Date End Date Taking? Authorizing Provider  albuterol (PROAIR HFA) 108 (90 BASE) MCG/ACT inhaler Inhale 2 puffs into the lungs every 4 (four) hours as needed for wheezing or shortness of breath.    Yes Historical Provider, MD  albuterol (PROVENTIL) (2.5 MG/3ML) 0.083% nebulizer solution Take 2.5 mg by nebulization every 6 (six) hours as needed for wheezing.    Yes Historical Provider, MD  amLODipine (NORVASC) 10 MG tablet Take 10 mg by mouth every morning.    Yes Historical Provider, MD  buPROPion (WELLBUTRIN) 75 MG tablet Take 75 mg by mouth 2 (two) times daily.   Yes Historical Provider, MD  gabapentin (NEURONTIN)  100 MG capsule Take 100 mg by mouth 2 (two) times daily.   Yes Historical Provider, MD  lisinopril-hydrochlorothiazide (PRINZIDE,ZESTORETIC) 10-12.5 MG per tablet Take 1 tablet by mouth every morning.   Yes Historical Provider, MD  meloxicam (MOBIC) 7.5 MG tablet Take 7.5 mg by mouth 2 (two) times daily.   Yes Historical Provider, MD  mometasone-formoterol (DULERA) 200-5 MCG/ACT AERO Inhale 1 puff into the lungs 2 (two) times daily.   Yes Historical Provider, MD  potassium chloride (K-DUR) 10 MEQ tablet Take 10 mEq by mouth every other day. Instructed to take 19mq daily from 9/6 - 9/11 then resume taking 118m every other day.   Yes Historical Provider, MD  rOPINIRole (REQUIP) 0.5 MG tablet Take 0.5 mg by mouth 2 (two) times daily.    Yes Historical Provider, MD  rosuvastatin (CRESTOR) 10 MG tablet Take 10 mg by mouth at bedtime.    Yes Historical Provider, MD  SitaGLIPtin-MetFORMIN HCl (JANUMET XR) 50-500 MG TB24 Take 1 tablet by mouth daily with breakfast.    Yes Historical Provider, MD  tiotropium (SPIRIVA HANDIHALER) 18 MCG inhalation capsule Place 18 mcg into inhaler and inhale daily.   Yes Historical Provider, MD     Vital Signs: BP 124/83 mmHg  Pulse 87  Temp(Src) 97.5 F (36.4 C) (Oral)  Resp 20  Ht '4\' 11"'$  (1.499 m)  Wt 117 lb (53.071 kg)  BMI 23.62 kg/m2  SpO2 98%  Physical Exam  Constitutional: She is oriented to person, place, and time. She appears well-developed and well-nourished.  Cardiovascular: Normal rate and regular rhythm.  Pulmonary/Chest: Effort normal.  Abdominal: Soft. Bowel sounds are normal. There is no tenderness.  Musculoskeletal: Normal range of motion. She exhibits no edema.  Neurological: She is alert and oriented to person, place, and time.    Imaging: X-ray Chest Pa Or Ap  04/30/2015   CLINICAL DATA:  Preop for radiofrequency ablation. History of asthma and right lung cancer.  EXAM: CHEST  1 VIEW  COMPARISON:  CT chest January 24, 2015  FINDINGS: The  heart size and mediastinal contours are stable. There is chronic scarring of the right upper lobe and right mid lung unchanged compared to prior exam. The previously CT noted nodule in the right upper lobe is not well seen on the chest x-ray. There is no focal infiltrate, pulmonary edema, or pleural effusion. The visualized skeletal structures are stable.  IMPRESSION: No active cardiopulmonary disease.   Electronically Signed   By: Abelardo Diesel M.D.   On: 04/30/2015 12:58    Labs:  CBC:  Recent Labs  04/19/15 1053 04/30/15 1050  WBC 4.9 4.6  HGB 11.8* 12.8  HCT 35.8 40.8  PLT 223 242    COAGS:  Recent Labs  04/30/15 1050  INR 0.98  APTT 33    BMP:  Recent Labs  04/19/15 1053 04/30/15 1050  NA 132* 140  K 3.4* 4.1  CL 99* 105  CO2 27 29  GLUCOSE 131* 101*  BUN 18 11  CALCIUM 8.3* 9.6  CREATININE 0.95 0.77  GFRNONAA >60 >60  GFRAA >60 >60    LIVER FUNCTION TESTS:  Recent Labs  04/19/15 1053  BILITOT 0.6  AST 22  ALT 16  ALKPHOS 52  PROT 7.2  ALBUMIN 4.1    Assessment and Plan: Pt with history of COPD, HTN, DM, adenocarcinoma of right lung 2013/prior radiation therapy; now with enlarging hypermetabolic right upper lobe lung nodule; plan is for CT guided thermal ablation of the RUL lung nodule today. Previous consultation done on 03/12/15. Details/risks of procedure, including but not limited to, internal bleeding, infection, anesthesia related complications, pneumothorax with possible chest tube placement and death d/w pt/significant other with their understanding and consent. Following procedure pt will be admitted for overnight observation.    Signed: D. Rowe Robert 05/03/2015, 8:11 AM   I spent a total of 30 minutes at the the patient's bedside AND on the patient's hospital floor or unit, greater than 50% of which was counseling/coordinating care for CT guided thermal ablation of hypermetabolic right lung nodule

## 2015-05-03 NOTE — Discharge Instructions (Signed)
Conscious Sedation, Adult, Care After °Refer to this sheet in the next few weeks. These instructions provide you with information on caring for yourself after your procedure. Your health care provider may also give you more specific instructions. Your treatment has been planned according to current medical practices, but problems sometimes occur. Call your health care provider if you have any problems or questions after your procedure. °WHAT TO EXPECT AFTER THE PROCEDURE  °After your procedure: °· You may feel sleepy, clumsy, and have poor balance for several hours. °· Vomiting may occur if you eat too soon after the procedure. °HOME CARE INSTRUCTIONS °· Do not participate in any activities where you could become injured for at least 24 hours. Do not: °¨ Drive. °¨ Swim. °¨ Ride a bicycle. °¨ Operate heavy machinery. °¨ Cook. °¨ Use power tools. °¨ Climb ladders. °¨ Work from a high place. °· Do not make important decisions or sign legal documents until you are improved. °· If you vomit, drink water, juice, or soup when you can drink without vomiting. Make sure you have little or no nausea before eating solid foods. °· Only take over-the-counter or prescription medicines for pain, discomfort, or fever as directed by your health care provider. °· Make sure you and your family fully understand everything about the medicines given to you, including what side effects may occur. °· You should not drink alcohol, take sleeping pills, or take medicines that cause drowsiness for at least 24 hours. °· If you smoke, do not smoke without supervision. °· If you are feeling better, you may resume normal activities 24 hours after you were sedated. °· Keep all appointments with your health care provider. °SEEK MEDICAL CARE IF: °· Your skin is pale or bluish in color. °· You continue to feel nauseous or vomit. °· Your pain is getting worse and is not helped by medicine. °· You have bleeding or swelling. °· You are still sleepy or  feeling clumsy after 24 hours. °SEEK IMMEDIATE MEDICAL CARE IF: °· You develop a rash. °· You have difficulty breathing. °· You develop any type of allergic problem. °· You have a fever. °MAKE SURE YOU: °· Understand these instructions. °· Will watch your condition. °· Will get help right away if you are not doing well or get worse. °Document Released: 05/24/2013 Document Reviewed: 05/24/2013 °[employer]® Patient Information ©2015 [employer]. This information is not intended to replace advice given to you by your health care provider. Make sure you discuss any questions you have with your health care provider. °  °

## 2015-05-03 NOTE — Anesthesia Postprocedure Evaluation (Signed)
  Anesthesia Post-op Note  Patient: Angela Jensen  Procedure(s) Performed: Procedure(s) (LRB): RIGHT RADIO FREQUENCY ABLATION (Right)  Patient Location: PACU  Anesthesia Type: GENERAL  Level of Consciousness: awake and alert   Airway and Oxygen Therapy: Patient Spontanous Breathing  Post-op Pain: mild  Post-op Assessment: Post-op Vital signs reviewed, Patient's Cardiovascular Status Stable, Respiratory Function Stable, Patent Airway and No signs of Nausea or vomiting  Last Vitals:  Filed Vitals:   05/03/15 1015  BP: 121/75  Pulse: 85  Temp:   Resp: 13    Post-op Vital Signs: stable   Complications: No apparent anesthesia complications

## 2015-05-03 NOTE — Anesthesia Preprocedure Evaluation (Addendum)
Anesthesia Evaluation  Patient identified by MRN, date of birth, ID band Patient awake    Reviewed: Allergy & Precautions, NPO status , Patient's Chart, lab work & pertinent test results  History of Anesthesia Complications Negative for: history of anesthetic complications  Airway Mallampati: II  TM Distance: >3 FB Neck ROM: Full    Dental no notable dental hx. (+) Dental Advisory Given, Edentulous Upper, Edentulous Lower   Pulmonary asthma , COPD,  COPD inhaler and oxygen dependent, former smoker,    Pulmonary exam normal breath sounds clear to auscultation       Cardiovascular hypertension, Pt. on medications Normal cardiovascular exam Rhythm:Regular Rate:Normal     Neuro/Psych PSYCHIATRIC DISORDERS Anxiety Depression negative neurological ROS     GI/Hepatic negative GI ROS, Neg liver ROS,   Endo/Other  diabetes  Renal/GU negative Renal ROS  negative genitourinary   Musculoskeletal  (+) Arthritis , Osteoarthritis,    Abdominal   Peds negative pediatric ROS (+)  Hematology negative hematology ROS (+)   Anesthesia Other Findings   Reproductive/Obstetrics negative OB ROS                           Anesthesia Physical Anesthesia Plan  ASA: III  Anesthesia Plan: General   Post-op Pain Management:    Induction: Intravenous  Airway Management Planned: Oral ETT  Additional Equipment:   Intra-op Plan:   Post-operative Plan: Extubation in OR  Informed Consent: I have reviewed the patients History and Physical, chart, labs and discussed the procedure including the risks, benefits and alternatives for the proposed anesthesia with the patient or authorized representative who has indicated his/her understanding and acceptance.   Dental advisory given  Plan Discussed with: CRNA  Anesthesia Plan Comments:        Anesthesia Quick Evaluation

## 2015-05-03 NOTE — Anesthesia Procedure Notes (Signed)
Procedure Name: Intubation Date/Time: 05/03/2015 8:47 AM Performed by: Anne Fu Pre-anesthesia Checklist: Patient identified, Emergency Drugs available, Suction available, Patient being monitored and Timeout performed Patient Re-evaluated:Patient Re-evaluated prior to inductionOxygen Delivery Method: Circle system utilized Preoxygenation: Pre-oxygenation with 100% oxygen Intubation Type: IV induction Ventilation: Mask ventilation without difficulty Laryngoscope Size: Mac and 3 Grade View: Grade I Tube type: Oral Tube size: 7.5 mm Number of attempts: 1 Airway Equipment and Method: Stylet Placement Confirmation: ETT inserted through vocal cords under direct vision,  positive ETCO2,  CO2 detector and breath sounds checked- equal and bilateral Secured at: 19 cm Tube secured with: Tape Dental Injury: Teeth and Oropharynx as per pre-operative assessment

## 2015-05-06 NOTE — Progress Notes (Signed)
[place]  Telephone:([phone]) [phone] Fax:(336) [phone]     ID: SHY GUALLPA OB: January 16, 1956  MR#: [id number]  VCB#:[id number]  Patient Care Team: Ronnell Freshwater, NP as PCP - General (Family Medicine)  CHIEF COMPLAINT/DIAGNOSIS:  Non small cell lung cancer, s/p radiation therapy.   PET scan done Dec 2012 showed a 10-mm spiculated right apical pulmonary nodule with SUV of 2.5, raising suspicion for lung cancer versus other etiology. Left ovary was prominent in size, measuring 3.9 x 2.4 cm.  Dec 2012 -  CA-125 normal at 9.3. CT scan March 2014 - slight increase in size of lung nodule. PET scan 11/23/12 IMPRESSION: 1. There is a spiculated right apical a 40m pulmonary nodule without metabolic activity. 2.There is a 13 mm right upper lobe nodule with ill-defined margins along the minor fissure which is increased in conspicuity compared to 08/03/2011 with an SUV max of 1.8 . This may represent post radiation change versus malignancy which has increased in size compared with the prior examinations. There is overlying chest wall soft tissue thickening and mild increased metabolic activity. 3. There is a new 5 mm right apical pulmonary nodule with no metabolic activity, but the nodule is below the size for characterization by PET. Followup CT chest is recommended in 3 months.  June 2016 - CT scan and PET scan showing hypermetabolic an enlarging right upper lobe lung nodule concerning for recurrence.   HISTORY OF PRESENT ILLNESS:  Patient returns for continued oncology followup, she was seen by thoracic surgeon and decided not to undergo surgical resection, she has met with interventional radiologist and has decided to pursue RFA of lung lesion. States that this is scheduled for later this month. Otherwise doing steady, states that chronic DOE and mild dry cough which is unchanged. She states that she does have an off and on pain in her right side when she moves a certain way, this has been  chronic. Appetite is good.  REVIEW OF SYSTEMS:   ROS As in HPI above. In addition, no fever, chills. No new headaches or focal weakness.  No abdominal pain, constipation, diarrhea, dysuria or hematuria. No new skin rash or bleeding symptoms. PS ECOG 1.  PAST MEDICAL HISTORY: Reviewed. Past Medical History  Diagnosis Date  . COPD (chronic obstructive pulmonary disease)   . Hypertension   . Hypercholesteremia   . Asthma   . Cancer 2012    Right Lung CA  . Diabetes mellitus without complication     Patient takes Janumet  . Oxygen dependent     2L at nite   . Shortness of breath dyspnea     with exertion   . Depression   . Arthritis     PAST SURGICAL HISTORY: Reviewed. Past Surgical History  Procedure Laterality Date  . Cesarean section      x3  . Lung biopsy  05 15 2013    has lung "spots"  . Cyst removal leg      and on shoulder     FAMILY HISTORY: Reviewed. Remarkable for diabetes. Father died from lung cancer.   SOCIAL HISTORY: Reviewed. Social History  Substance Use Topics  . Smoking status: Former Smoker    Types: Cigarettes    Quit date: 02/06/2010  . Smokeless tobacco: Former [employer] developer   Types: Snuff  . Alcohol Use: No     Comment: hx of etoh use     No Known Allergies  Current Outpatient Prescriptions  Medication Sig Dispense Refill  .  albuterol (PROAIR HFA) 108 (90 BASE) MCG/ACT inhaler Inhale 2 puffs into the lungs every 4 (four) hours as needed for wheezing or shortness of breath.     Marland Kitchen albuterol (PROVENTIL) (2.5 MG/3ML) 0.083% nebulizer solution Take 2.5 mg by nebulization every 6 (six) hours as needed for wheezing.     Marland Kitchen amLODipine (NORVASC) 10 MG tablet Take 10 mg by mouth every morning.     Marland Kitchen buPROPion (WELLBUTRIN) 75 MG tablet Take 75 mg by mouth 2 (two) times daily.    Marland Kitchen gabapentin (NEURONTIN) 100 MG capsule Take 100 mg by mouth 2 (two) times daily.    Marland Kitchen lisinopril-hydrochlorothiazide (PRINZIDE,ZESTORETIC) 10-12.5 MG per tablet Take 1 tablet by  mouth every morning.    . meloxicam (MOBIC) 7.5 MG tablet Take 7.5 mg by mouth 2 (two) times daily.    . mometasone-formoterol (DULERA) 200-5 MCG/ACT AERO Inhale 1 puff into the lungs 2 (two) times daily.    . potassium chloride (K-DUR) 10 MEQ tablet Take 10 mEq by mouth every other day. Instructed to take 64mq daily from 9/6 - 9/11 then resume taking 144m every other day.    . Marland KitchenOPINIRole (REQUIP) 0.5 MG tablet Take 0.5 mg by mouth 2 (two) times daily.     . rosuvastatin (CRESTOR) 10 MG tablet Take 10 mg by mouth at bedtime.     . SitaGLIPtin-MetFORMIN HCl (JANUMET XR) 50-500 MG TB24 Take 1 tablet by mouth daily with breakfast.     . tiotropium (SPIRIVA HANDIHALER) 18 MCG inhalation capsule Place 18 mcg into inhaler and inhale daily.     No current facility-administered medications for this visit.    PHYSICAL EXAM: Filed Vitals:   04/19/15 1110  BP: 99/62  Pulse: 85  Temp: 98 F (36.7 C)  Resp: 18     Body mass index is 23.94 kg/(m^2).    ECOG FS:1 - Symptomatic but completely ambulatory  GENERAL: Patient is alert and oriented and in no acute distress. There is no icterus. LUNGS: Bilaterally diminished breath sounds overall, no rhonchi. ABDOMEN: Soft, nontender.  EXTREMITIES: No pedal edema.   LAB RESULTS:    Component Value Date/Time   NA 140 04/30/2015 1050   NA 141 04/17/2014 1055   K 4.1 04/30/2015 1050   K 3.9 04/17/2014 1055   CL 105 04/30/2015 1050   CL 102 04/17/2014 1055   CO2 29 04/30/2015 1050   CO2 30 04/17/2014 1055   GLUCOSE 101* 04/30/2015 1050   GLUCOSE 201* 04/17/2014 1055   BUN 11 04/30/2015 1050   BUN 10 04/17/2014 1055   CREATININE 0.77 04/30/2015 1050   CREATININE 0.90 04/17/2014 1055   CALCIUM 9.6 04/30/2015 1050   CALCIUM 9.1 04/17/2014 1055   PROT 7.2 04/19/2015 1053   PROT 8.1 04/17/2014 1055   ALBUMIN 4.1 04/19/2015 1053   ALBUMIN 4.1 04/17/2014 1055   AST 22 04/19/2015 1053   AST 27 04/17/2014 1055   ALT 16 04/19/2015 1053   ALT 29  04/17/2014 1055   ALKPHOS 52 04/19/2015 1053   ALKPHOS 73 04/17/2014 1055   BILITOT 0.6 04/19/2015 1053   BILITOT 0.4 04/17/2014 1055   GFRNONAA >60 04/30/2015 1050   GFRNONAA >60 04/17/2014 1055   GFRAA >60 04/30/2015 1050   GFRAA >60 04/17/2014 1055    Lab Results  Component Value Date   WBC 4.6 04/30/2015   NEUTROABS 2.9 04/30/2015   HGB 12.8 04/30/2015   HCT 40.8 04/30/2015   MCV 92.3 04/30/2015   PLT 242 04/30/2015  No results found for: IRON    STUDIES: 01/24/15 - CT Chest.  IMPRESSION:  1. Progressive enlargement of right upper lobe nodule most consistent with a new primary malignancy. This nodule should be large enough to evaluate with PET-CT. Alternatively, tissue sampling could be attempted.  2. Mostly stable post treatment changes adjacent to the minor fissure within the right upper and middle lobes. There is a small nodular component within the adjacent lower lobe which has enlarged, although is irregular in shape and indeterminate.  3. No adenopathy.  01/30/15 - PET scan  IMPRESSION:  1. Hypermetabolic and enlarging right upper lobe pulmonary nodules  concerning for lung cancer recurrence.  2. Small focus of peripheral nodular consolidation in the superior segment of the right lower lobe with differential including inflammation or infection versus neoplasm. Favor information or infection; however recommend attention on follow-up.  3. No evidence of mediastinal nodal metastasis.    ASSESSMENT / PLAN:   Non small cell lung cancer, s/p radiation therapy. CT chest in March 2014 showed slight increase in size of lung nodule. June 2016 - CT scan and PET scan showing hypermetabolic an enlarging right upper lobe lung nodule concerning for recurrence -   Patient overall doing steady clinically. No major pain issues or hemoptysis. Appetite is good. She is planned for radiofrequency ablation of lung lesion by interventional radiology at [facility] later this month. Given this, wIll  continue clinical monitoring and see her back at 3 months with labs.  In between visits, she was advised to call in case of any new symptoms or sickness and will be evaluated sooner. The patient is agreeable to this plan.     Leia Alf, MD   05/06/2015 9:14 AM

## 2015-06-25 ENCOUNTER — Ambulatory Visit: Payer: Medicaid Other | Admitting: Anesthesiology

## 2015-06-25 ENCOUNTER — Ambulatory Visit
Admission: RE | Admit: 2015-06-25 | Discharge: 2015-06-25 | Disposition: A | Discharge status: Home / Self Care | Payer: Medicaid Other | Source: Ambulatory Visit | Attending: Gastroenterology | Admitting: Gastroenterology

## 2015-06-25 ENCOUNTER — Encounter
Admission: RE | Disposition: A | Discharge status: Home / Self Care | Payer: Self-pay | Source: Ambulatory Visit | Attending: Gastroenterology

## 2015-06-25 ENCOUNTER — Encounter: Payer: Self-pay | Admitting: *Deleted

## 2015-06-25 DIAGNOSIS — Z7951 Long term (current) use of inhaled steroids: Secondary | ICD-10-CM | POA: Insufficient documentation

## 2015-06-25 DIAGNOSIS — Z79899 Other long term (current) drug therapy: Secondary | ICD-10-CM | POA: Insufficient documentation

## 2015-06-25 DIAGNOSIS — F329 Major depressive disorder, single episode, unspecified: Secondary | ICD-10-CM | POA: Insufficient documentation

## 2015-06-25 DIAGNOSIS — K641 Second degree hemorrhoids: Secondary | ICD-10-CM | POA: Insufficient documentation

## 2015-06-25 DIAGNOSIS — C3491 Malignant neoplasm of unspecified part of right bronchus or lung: Secondary | ICD-10-CM | POA: Diagnosis not present

## 2015-06-25 DIAGNOSIS — I10 Essential (primary) hypertension: Secondary | ICD-10-CM | POA: Diagnosis not present

## 2015-06-25 DIAGNOSIS — E119 Type 2 diabetes mellitus without complications: Secondary | ICD-10-CM | POA: Diagnosis not present

## 2015-06-25 DIAGNOSIS — Z1211 Encounter for screening for malignant neoplasm of colon: Secondary | ICD-10-CM | POA: Diagnosis present

## 2015-06-25 DIAGNOSIS — D125 Benign neoplasm of sigmoid colon: Secondary | ICD-10-CM | POA: Insufficient documentation

## 2015-06-25 DIAGNOSIS — E7800 Pure hypercholesterolemia, unspecified: Secondary | ICD-10-CM | POA: Diagnosis not present

## 2015-06-25 DIAGNOSIS — J449 Chronic obstructive pulmonary disease, unspecified: Secondary | ICD-10-CM | POA: Diagnosis not present

## 2015-06-25 DIAGNOSIS — M1990 Unspecified osteoarthritis, unspecified site: Secondary | ICD-10-CM | POA: Diagnosis not present

## 2015-06-25 DIAGNOSIS — Z9981 Dependence on supplemental oxygen: Secondary | ICD-10-CM | POA: Insufficient documentation

## 2015-06-25 DIAGNOSIS — Z7984 Long term (current) use of oral hypoglycemic drugs: Secondary | ICD-10-CM | POA: Insufficient documentation

## 2015-06-25 DIAGNOSIS — R0602 Shortness of breath: Secondary | ICD-10-CM | POA: Insufficient documentation

## 2015-06-25 HISTORY — PX: COLONOSCOPY WITH PROPOFOL: SHX5780

## 2015-06-25 LAB — GLUCOSE, CAPILLARY: GLUCOSE-CAPILLARY: 125 mg/dL — AB (ref 65–99)

## 2015-06-25 SURGERY — COLONOSCOPY WITH PROPOFOL
Anesthesia: General

## 2015-06-25 MED ORDER — SODIUM CHLORIDE 0.9 % IV SOLN
INTRAVENOUS | Status: DC
  Administered 2015-06-25: 08:00:00 via INTRAVENOUS

## 2015-06-25 MED ORDER — PROPOFOL 500 MG/50ML IV EMUL
INTRAVENOUS | Status: DC | PRN
  Administered 2015-06-25: 100 ug/kg/min via INTRAVENOUS

## 2015-06-25 MED ORDER — MIDAZOLAM HCL 2 MG/2ML IJ SOLN
INTRAMUSCULAR | Status: DC | PRN
  Administered 2015-06-25: 1 mg via INTRAVENOUS

## 2015-06-25 MED ORDER — FENTANYL CITRATE (PF) 100 MCG/2ML IJ SOLN
INTRAMUSCULAR | Status: DC | PRN
  Administered 2015-06-25: 50 ug via INTRAVENOUS

## 2015-06-25 MED ORDER — PHENYLEPHRINE HCL 10 MG/ML IJ SOLN
INTRAMUSCULAR | Status: DC | PRN
  Administered 2015-06-25: 50 ug via INTRAVENOUS

## 2015-06-25 NOTE — Anesthesia Preprocedure Evaluation (Addendum)
Anesthesia Evaluation  Patient identified by MRN, date of birth, ID band Patient awake    Reviewed: Allergy & Precautions, NPO status , Patient's Chart, lab work & pertinent test results, reviewed documented beta blocker date and time   Airway Mallampati: II  TM Distance: >3 FB     Dental  (+) Lower Dentures, Upper Dentures   Pulmonary shortness of breath, asthma , COPD, former smoker,           Cardiovascular hypertension, Pt. on medications      Neuro/Psych PSYCHIATRIC DISORDERS Depression    GI/Hepatic   Endo/Other  diabetes, Type 2  Renal/GU      Musculoskeletal  (+) Arthritis ,   Abdominal   Peds  Hematology   Anesthesia Other Findings   Reproductive/Obstetrics                            Anesthesia Physical Anesthesia Plan  ASA: III  Anesthesia Plan: General   Post-op Pain Management:    Induction: Intravenous  Airway Management Planned: Nasal Cannula  Additional Equipment:   Intra-op Plan:   Post-operative Plan:   Informed Consent: I have reviewed the patients History and Physical, chart, labs and discussed the procedure including the risks, benefits and alternatives for the proposed anesthesia with the patient or authorized representative who has indicated his/her understanding and acceptance.     Plan Discussed with: CRNA  Anesthesia Plan Comments:         Anesthesia Quick Evaluation

## 2015-06-25 NOTE — Transfer of Care (Signed)
Immediate Anesthesia Transfer of Care Note  Patient: Angela Jensen  Procedure(s) Performed: Procedure(s): COLONOSCOPY WITH PROPOFOL (N/A)  Patient Location: PACU  Anesthesia Type:general  Level of Consciousness: awake and sedated  Airway & Oxygen Therapy: Patient Spontanous Breathing and Patient connected to nasal cannula oxygen  Post-op Assessment: Report given to RN and Post -op Vital signs reviewed and stable  Post vital signs: Reviewed and stable  Last Vitals:  Filed Vitals:   06/25/15 0716  BP: 108/81  Pulse: 85  Temp: 35.9 C  Resp: 18    Complications: No apparent anesthesia complications

## 2015-06-25 NOTE — Anesthesia Procedure Notes (Signed)
Performed by: COOK-MARTIN, Niketa Turner Pre-anesthesia Checklist: Patient identified, Emergency Drugs available, Suction available, Patient being monitored and Timeout performed Patient Re-evaluated:Patient Re-evaluated prior to inductionOxygen Delivery Method: Nasal cannula Preoxygenation: Pre-oxygenation with 100% oxygen Intubation Type: IV induction Placement Confirmation: positive ETCO2 and CO2 detector       

## 2015-06-25 NOTE — Op Note (Signed)
[facility] Patient Name: Angela Jensen Procedure Date: 06/25/2015 7:58 AM MRN: [id number] Account #: [id number] Date of Birth: [birth year] Admit Type: Outpatient Age: 59 Room: [facility] END[facility] ROOM 4 Gender: Female Note Status: Finalized Procedure:         Colonoscopy Indications:       Screening for colorectal malignant neoplasm Providers:         Lucilla Lame, MD Referring MD:      [name], MD (Referring MD) Medicines:         Propofol per Anesthesia Complications:     No immediate complications. Procedure:         Pre-Anesthesia Assessment:                    - Prior to the procedure, a History and Physical was                     performed, and patient medications and allergies were                     reviewed. The patient's tolerance of previous anesthesia                     was also reviewed. The risks and benefits of the procedure                     and the sedation options and risks were discussed with the                     patient. All questions were answered, and informed consent                     was obtained. Prior Anticoagulants: The patient has taken                     no previous anticoagulant or antiplatelet agents. ASA                     Grade Assessment: II - A patient with mild systemic                     disease. After reviewing the risks and benefits, the                     patient was deemed in satisfactory condition to undergo                     the procedure.                    After obtaining informed consent, the colonoscope was                     passed under direct vision. Throughout the procedure, the                     patient's blood pressure, pulse, and oxygen saturations                     were monitored continuously. The Colonoscope was                     introduced through the anus and advanced to the the cecum,  identified by appendiceal orifice and ileocecal valve. The           colonoscopy was performed without difficulty. The patient                     tolerated the procedure well. The quality of the bowel                     preparation was excellent. Findings:      The perianal and digital rectal examinations were normal.      A 4 mm polyp was found in the sigmoid colon. The polyp was sessile. The       polyp was removed with a cold snare. Resection and retrieval were       complete.      Non-bleeding internal hemorrhoids were found during retroflexion. The       hemorrhoids were Grade II (internal hemorrhoids that prolapse but reduce       spontaneously). Impression:        - One 4 mm polyp in the sigmoid colon. Resected and                     retrieved.                    - Non-bleeding internal hemorrhoids. Recommendation:    - Await pathology results.                    - Repeat colonoscopy in 5 years if polyp adenoma and 10                     years if hyperplastic Procedure Code(s): --- Professional ---                    [id number], Colonoscopy, flexible; with removal of tumor(s),                     polyp(s), or other lesion(s) by snare technique Diagnosis Code(s): --- Professional ---                    Z12.11, Encounter for screening for malignant neoplasm of                     colon                    D12.5, Benign neoplasm of sigmoid colon CPT copyright 2014 American Medical Association. All rights reserved. The codes documented in this report are preliminary and upon coder review may  be revised to meet current compliance requirements. Lucilla Lame, MD 06/25/2015 8:19:13 AM This report has been signed electronically. Number of Addenda: 0 Note Initiated On: 06/25/2015 7:58 AM Scope Withdrawal Time: 0 hours 6 minutes 3 seconds  Total Procedure Duration: 0 hours 12 minutes 20 seconds       [facility]

## 2015-06-25 NOTE — Anesthesia Postprocedure Evaluation (Signed)
  Anesthesia Post-op Note  Patient: Angela Jensen  Procedure(s) Performed: Procedure(s): COLONOSCOPY WITH PROPOFOL (N/A)  Anesthesia type:General  Patient location: PACU  Post pain: Pain level controlled  Post assessment: Post-op Vital signs reviewed, Patient's Cardiovascular Status Stable, Respiratory Function Stable, Patent Airway and No signs of Nausea or vomiting  Post vital signs: Reviewed and stable  Last Vitals:  Filed Vitals:   06/25/15 0900  BP: 108/65  Pulse:   Temp:   Resp:     Level of consciousness: awake, alert  and patient cooperative  Complications: No apparent anesthesia complications

## 2015-06-25 NOTE — H&P (Signed)
[facility]  [address] [place], [place] [address] Phone: [phone] Fax : [phone]  Primary Care Physician:  Ronnell Freshwater, NP Primary Gastroenterologist:  Dr. Allen Norris  Pre-Procedure History & Physical: HPI:  Angela Jensen is a 59 y.o. female is here for a screening colonoscopy.   Past Medical History  Diagnosis Date  . COPD (chronic obstructive pulmonary disease) ([place])   . Hypertension   . Hypercholesteremia   . Asthma   . Cancer [facility]) 2012    Right Lung CA  . Diabetes mellitus without complication [facility])     Patient takes Janumet  . Oxygen dependent     2L at nite   . Shortness of breath dyspnea     with exertion   . Depression   . Arthritis     Past Surgical History  Procedure Laterality Date  . Cesarean section      x3  . Lung biopsy  05 15 2013    has lung "spots"  . Cyst removal leg      and on shoulder     Prior to Admission medications   Medication Sig Start Date End Date Taking? Authorizing Provider  albuterol (PROAIR HFA) 108 (90 BASE) MCG/ACT inhaler Inhale 2 puffs into the lungs every 4 (four) hours as needed for wheezing or shortness of breath.    Yes Historical Provider, MD  mometasone-formoterol (DULERA) 200-5 MCG/ACT AERO Inhale 1 puff into the lungs 2 (two) times daily.   Yes Historical Provider, MD  tiotropium (SPIRIVA HANDIHALER) 18 MCG inhalation capsule Place 18 mcg into inhaler and inhale daily.   Yes Historical Provider, MD  albuterol (PROVENTIL) (2.5 MG/3ML) 0.083% nebulizer solution Take 2.5 mg by nebulization every 6 (six) hours as needed for wheezing.     Historical Provider, MD  amLODipine (NORVASC) 10 MG tablet Take 10 mg by mouth every morning.     Historical Provider, MD  buPROPion (WELLBUTRIN) 75 MG tablet Take 75 mg by mouth 2 (two) times daily.    Historical Provider, MD  gabapentin (NEURONTIN) 100 MG capsule Take 100 mg by mouth 2 (two) times daily.    Historical Provider, MD    lisinopril-hydrochlorothiazide (PRINZIDE,ZESTORETIC) 10-12.5 MG per tablet Take 1 tablet by mouth every morning.    Historical Provider, MD  meloxicam (MOBIC) 7.5 MG tablet Take 7.5 mg by mouth 2 (two) times daily.    Historical Provider, MD  potassium chloride (K-DUR) 10 MEQ tablet Take 10 mEq by mouth every other day. Instructed to take 32mq daily from 9/6 - 9/11 then resume taking 126m every other day.    Historical Provider, MD  rOPINIRole (REQUIP) 0.5 MG tablet Take 0.5 mg by mouth 2 (two) times daily.     Historical Provider, MD  rosuvastatin (CRESTOR) 10 MG tablet Take 10 mg by mouth at bedtime.     Historical Provider, MD  SitaGLIPtin-MetFORMIN HCl (JANUMET XR) 50-500 MG TB24 Take 1 tablet by mouth daily with breakfast.     Historical Provider, MD    Allergies as of 05/02/2015  . (No Known Allergies)    History reviewed. No pertinent family history.  Social History   Social History  . Marital Status: Widowed    Spouse Name: N/A  . Number of Children: N/A  . Years of Education: N/A   Occupational History  . Not on file.   Social History Main Topics  . Smoking status: Former Smoker    Types: Cigarettes    Quit date:  02/06/2010  . Smokeless tobacco: Former Systems developer    Types: Snuff  . Alcohol Use: No     Comment: hx of etoh use   . Drug Use: Yes    Special: Marijuana     Comment: hx of cocaine use- last use 2015   . Sexual Activity: Not on file   Other Topics Concern  . Not on file   Social History Narrative    Review of Systems: See HPI, otherwise negative ROS  Physical Exam: BP 108/81 mmHg  Pulse 85  Temp(Src) 96.6 F (35.9 C) (Tympanic)  Resp 18  Ht '4\' 11"'$  (1.499 m)  Wt 117 lb (53.071 kg)  BMI 23.62 kg/m2  SpO2 100% General:   Alert,  pleasant and cooperative in NAD Head:  Normocephalic and atraumatic. Neck:  Supple; no masses or thyromegaly. Lungs:  Clear throughout to auscultation.    Heart:  Regular rate and rhythm. Abdomen:  Soft, nontender and  nondistended. Normal bowel sounds, without guarding, and without rebound.   Neurologic:  Alert and  oriented x4;  grossly normal neurologically.  Impression/Plan: [name] is now here to undergo a screening colonoscopy.  Risks, benefits, and alternatives regarding colonoscopy have been reviewed with the patient.  Questions have been answered.  All parties agreeable.

## 2015-06-26 ENCOUNTER — Encounter: Payer: Self-pay | Admitting: Gastroenterology

## 2015-06-26 LAB — SURGICAL PATHOLOGY

## 2015-07-22 ENCOUNTER — Other Ambulatory Visit: Payer: Self-pay | Admitting: *Deleted

## 2015-07-22 DIAGNOSIS — C3411 Malignant neoplasm of upper lobe, right bronchus or lung: Secondary | ICD-10-CM

## 2015-07-23 ENCOUNTER — Inpatient Hospital Stay (HOSPITAL_BASED_OUTPATIENT_CLINIC_OR_DEPARTMENT_OTHER): Payer: Medicaid Other | Admitting: Internal Medicine

## 2015-07-23 ENCOUNTER — Inpatient Hospital Stay: Payer: Medicaid Other | Attending: Internal Medicine

## 2015-07-23 ENCOUNTER — Encounter: Payer: Self-pay | Admitting: Internal Medicine

## 2015-07-23 VITALS — BP 125/81 | HR 86 | Temp 97.5°F | Resp 18 | Ht 59.0 in | Wt 114.2 lb

## 2015-07-23 DIAGNOSIS — Z9981 Dependence on supplemental oxygen: Secondary | ICD-10-CM | POA: Insufficient documentation

## 2015-07-23 DIAGNOSIS — Z87891 Personal history of nicotine dependence: Secondary | ICD-10-CM

## 2015-07-23 DIAGNOSIS — E7800 Pure hypercholesterolemia, unspecified: Secondary | ICD-10-CM | POA: Diagnosis not present

## 2015-07-23 DIAGNOSIS — J45909 Unspecified asthma, uncomplicated: Secondary | ICD-10-CM

## 2015-07-23 DIAGNOSIS — I10 Essential (primary) hypertension: Secondary | ICD-10-CM

## 2015-07-23 DIAGNOSIS — R918 Other nonspecific abnormal finding of lung field: Secondary | ICD-10-CM

## 2015-07-23 DIAGNOSIS — C3411 Malignant neoplasm of upper lobe, right bronchus or lung: Secondary | ICD-10-CM | POA: Insufficient documentation

## 2015-07-23 DIAGNOSIS — J449 Chronic obstructive pulmonary disease, unspecified: Secondary | ICD-10-CM | POA: Diagnosis not present

## 2015-07-23 DIAGNOSIS — Z79899 Other long term (current) drug therapy: Secondary | ICD-10-CM | POA: Insufficient documentation

## 2015-07-23 DIAGNOSIS — Z801 Family history of malignant neoplasm of trachea, bronchus and lung: Secondary | ICD-10-CM | POA: Diagnosis not present

## 2015-07-23 DIAGNOSIS — E119 Type 2 diabetes mellitus without complications: Secondary | ICD-10-CM | POA: Diagnosis not present

## 2015-07-23 DIAGNOSIS — C3491 Malignant neoplasm of unspecified part of right bronchus or lung: Secondary | ICD-10-CM

## 2015-07-23 DIAGNOSIS — Z923 Personal history of irradiation: Secondary | ICD-10-CM | POA: Diagnosis not present

## 2015-07-23 LAB — COMPREHENSIVE METABOLIC PANEL
ALT: 25 U/L (ref 14–54)
AST: 24 U/L (ref 15–41)
Albumin: 4 g/dL (ref 3.5–5.0)
Alkaline Phosphatase: 62 U/L (ref 38–126)
Anion gap: 8 (ref 5–15)
BILIRUBIN TOTAL: 0.5 mg/dL (ref 0.3–1.2)
BUN: 17 mg/dL (ref 6–20)
CHLORIDE: 97 mmol/L — AB (ref 101–111)
CO2: 28 mmol/L (ref 22–32)
CREATININE: 0.79 mg/dL (ref 0.44–1.00)
Calcium: 8.9 mg/dL (ref 8.9–10.3)
Glucose, Bld: 122 mg/dL — ABNORMAL HIGH (ref 65–99)
POTASSIUM: 3.4 mmol/L — AB (ref 3.5–5.1)
Sodium: 133 mmol/L — ABNORMAL LOW (ref 135–145)
TOTAL PROTEIN: 7.7 g/dL (ref 6.5–8.1)

## 2015-07-23 LAB — CBC WITH DIFFERENTIAL/PLATELET
BASOS ABS: 0 10*3/uL (ref 0–0.1)
BASOS PCT: 1 %
EOS ABS: 0.1 10*3/uL (ref 0–0.7)
EOS PCT: 2 %
HEMATOCRIT: 37.7 % (ref 35.0–47.0)
Hemoglobin: 12.5 g/dL (ref 12.0–16.0)
LYMPHS ABS: 1.4 10*3/uL (ref 1.0–3.6)
Lymphocytes Relative: 27 %
MCH: 28.9 pg (ref 26.0–34.0)
MCHC: 33.2 g/dL (ref 32.0–36.0)
MCV: 87 fL (ref 80.0–100.0)
MONOS PCT: 10 %
Monocytes Absolute: 0.5 10*3/uL (ref 0.2–0.9)
NEUTROS PCT: 60 %
Neutro Abs: 3.1 10*3/uL (ref 1.4–6.5)
PLATELETS: 227 10*3/uL (ref 150–440)
RBC: 4.34 MIL/uL (ref 3.80–5.20)
RDW: 13.4 % (ref 11.5–14.5)
WBC: 5.1 10*3/uL (ref 3.6–11.0)

## 2015-07-23 NOTE — Progress Notes (Signed)
Pt here for f/u.and she states she did not have the ablation because Dr. Laural Roes said that the lesion on right shrunk and there was no need to have it will cont. To monitor.  She is not having any breathing problems, no fevers. No congestion and no dry cough

## 2015-07-23 NOTE — Progress Notes (Signed)
[place]  [phone] [phone]     ID: Angela Jensen OB: 02/14/1956  MR#: [id number]  JEH#:[id number]  Patient Care Team: Angela Freshwater, NP as PCP - General (Family Medicine)  CHIEF COMPLAINT/DIAGNOSIS:  Non small cell lung cancer, s/p radiation therapy.   PET scan done Dec 2012 showed a 10-mm spiculated right apical pulmonary nodule with SUV of 2.5, raising suspicion for lung cancer versus other etiology. Left ovary was prominent in size, measuring 3.9 x 2.4 cm.  Dec 2012 -  CA-125 normal at 9.3. CT scan March 2014 - slight increase in size of lung nodule. PET scan 11/23/12 IMPRESSION: 1. There is a spiculated right apical a 13m pulmonary nodule without metabolic activity. 2.There is a 13 mm right upper lobe nodule with ill-defined margins along the minor fissure which is increased in conspicuity compared to 08/03/2011 with an SUV max of 1.8 . This may represent post radiation change versus malignancy which has increased in size compared with the prior examinations. There is overlying chest wall soft tissue thickening and mild increased metabolic activity. 3. There is a new 5 mm right apical pulmonary nodule with no metabolic activity, but the nodule is below the size for characterization by PET. Followup CT chest is recommended in 3 months.  June 2016 - CT scan and PET scan showing hypermetabolic an enlarging right upper lobe lung nodule concerning for recurrence.  In September 2016 attempt was made for radiofrequency ablation of 1 enlarging right upper lobe nodule, however at the time of the procedure the nodule had shrunk to 5 mm from 9 mm, so the residual was aborted.  HISTORY OF PRESENT ILLNESS:  Mr. Angela Levanreturns to our clinic for follow-up visit. She has done well since her last appointment. She is able to perform all activities of daily living, with minimal difficulties, using oxygen only at night. She denies cough, fevers, chills, chest  pain.  REVIEW OF SYSTEMS:   ROS As in HPI above. No new headaches or focal weakness.  No abdominal pain, constipation, diarrhea, dysuria or hematuria. No new skin rash or bleeding symptoms. PS ECOG 1.  PAST MEDICAL HISTORY: Reviewed. Past Medical History  Diagnosis Date  . COPD (chronic obstructive pulmonary disease) ([place]   . Hypertension   . Hypercholesteremia   . Asthma   . Cancer ([facility] 2012    Right Lung CA  . Diabetes mellitus without complication ([facility]     Patient takes Janumet  . Oxygen dependent     2L at nite   . Shortness of breath dyspnea     with exertion   . Depression   . Arthritis     PAST SURGICAL HISTORY: Reviewed. Past Surgical History  Procedure Laterality Date  . Cesarean section      x3  . Lung biopsy  05 15 2013    has lung "spots"  . Cyst removal leg      and on shoulder   . Colonoscopy with propofol N/A 06/25/2015    Procedure: COLONOSCOPY WITH PROPOFOL;  Surgeon: DLucilla Lame MD;  Location: [facility] ENDOSCOPY;  Service: Endoscopy;  Laterality: N/A;    FAMILY HISTORY: Reviewed. Remarkable for diabetes. Father died from lung cancer.   SOCIAL HISTORY: Reviewed. Social History  Substance Use Topics  . Smoking status: Former Smoker -- 1.00 packs/day for 30 years    Types: Cigarettes    Quit date: 02/06/2010  . Smokeless tobacco: Former [employer] developer   Types: Snuff  .  Alcohol Use: No     Comment: hx of etoh use     No Known Allergies  Current Outpatient Prescriptions  Medication Sig Dispense Refill  . albuterol (PROAIR HFA) 108 (90 BASE) MCG/ACT inhaler Inhale 2 puffs into the lungs every 4 (four) hours as needed for wheezing or shortness of breath.     Marland Kitchen albuterol (PROVENTIL) (2.5 MG/3ML) 0.083% nebulizer solution Take 2.5 mg by nebulization every 6 (six) hours as needed for wheezing.     Marland Kitchen buPROPion (WELLBUTRIN) 75 MG tablet Take 75 mg by mouth 2 (two) times daily.    Marland Kitchen gabapentin (NEURONTIN) 100 MG capsule Take 100 mg by mouth 2 (two) times daily.     Marland Kitchen lisinopril-hydrochlorothiazide (PRINZIDE,ZESTORETIC) 10-12.5 MG per tablet Take 1 tablet by mouth every morning.    . meloxicam (MOBIC) 7.5 MG tablet Take 7.5 mg by mouth 2 (two) times daily.    . mometasone-formoterol (DULERA) 200-5 MCG/ACT AERO Inhale 1 puff into the lungs 2 (two) times daily.    . potassium chloride (K-DUR) 10 MEQ tablet Take 10 mEq by mouth every other day. Instructed to take 89mq daily from 9/6 - 9/11 then resume taking 116m every other day.    . Marland KitchenOPINIRole (REQUIP) 0.5 MG tablet Take 0.5 mg by mouth 2 (two) times daily.     . rosuvastatin (CRESTOR) 10 MG tablet Take 10 mg by mouth at bedtime.     . SitaGLIPtin-MetFORMIN HCl (JANUMET XR) 50-500 MG TB24 Take 1 tablet by mouth daily with breakfast.     . tiotropium (SPIRIVA HANDIHALER) 18 MCG inhalation capsule Place 18 mcg into inhaler and inhale daily.    . Marland KitchenmLODipine (NORVASC) 10 MG tablet Take 10 mg by mouth every morning.      No current facility-administered medications for this visit.    PHYSICAL EXAM: Filed Vitals:   07/23/15 1111  BP: 125/81  Pulse: 86  Temp: 97.5 F (36.4 C)  Resp: 18     Body mass index is 23.05 kg/(m^2).    ECOG FS:1 - Symptomatic but completely ambulatory  BP 125/81 mmHg  Pulse 86  Temp(Src) 97.5 F (36.4 C) (Tympanic)  Resp 18  Ht '4\' 11"'$  (1.499 m)  Wt 114 lb 3.2 oz (51.8 kg)  BMI 23.05 kg/m2  General Appearance:    Alert, cooperative, no distress, appears stated age African-American female   Head:    Normocephalic, without obvious abnormality, atraumatic  Eyes:    PERRL, conjunctiva/corneas clear, EOM's intact, fundi    benign, both eyes  Ears:    Normal TM's and external ear canals, both ears  Nose:   Nares normal, septum midline, mucosa normal, no drainage    or sinus tenderness  Throat:   Lips, mucosa, and tongue normal; teeth and gums normal  Neck:   Supple, symmetrical, trachea midline, no adenopathy;    thyroid:  no enlargement/tenderness/nodules; no carotid    bruit or JVD  Back:     Symmetric, no curvature, ROM normal, no CVA tenderness  Lungs:     fair air entry bilaterally, no wheezes or rhonchi.   Chest Wall:    No tenderness or deformity   Heart:    Regular rate and rhythm, S1 and S2 normal, no murmur, rub   or gallop  Abdomen:     Soft, non-tender, bowel sounds active all four quadrants,    no masses, no organomegaly  Extremities:   Extremities normal, atraumatic, no cyanosis or edema  Pulses:   2+  and symmetric all extremities  Skin:   Skin color, texture, turgor normal, no rashes or lesions  Lymph nodes:   Cervical, supraclavicular, and axillary nodes normal  Neurologic:   CNII-XII intact, normal strength, sensation and reflexes    throughout     LAB RESULTS:    Component Value Date/Time   NA 133* 07/23/2015 1044   NA 141 04/17/2014 1055   K 3.4* 07/23/2015 1044   K 3.9 04/17/2014 1055   CL 97* 07/23/2015 1044   CL 102 04/17/2014 1055   CO2 28 07/23/2015 1044   CO2 30 04/17/2014 1055   GLUCOSE 122* 07/23/2015 1044   GLUCOSE 201* 04/17/2014 1055   BUN 17 07/23/2015 1044   BUN 10 04/17/2014 1055   CREATININE 0.79 07/23/2015 1044   CREATININE 0.90 04/17/2014 1055   CALCIUM 8.9 07/23/2015 1044   CALCIUM 9.1 04/17/2014 1055   PROT 7.7 07/23/2015 1044   PROT 8.1 04/17/2014 1055   ALBUMIN 4.0 07/23/2015 1044   ALBUMIN 4.1 04/17/2014 1055   AST 24 07/23/2015 1044   AST 27 04/17/2014 1055   ALT 25 07/23/2015 1044   ALT 29 04/17/2014 1055   ALKPHOS 62 07/23/2015 1044   ALKPHOS 73 04/17/2014 1055   BILITOT 0.5 07/23/2015 1044   BILITOT 0.4 04/17/2014 1055   GFRNONAA >60 07/23/2015 1044   GFRNONAA >60 04/17/2014 1055   GFRAA >60 07/23/2015 1044   GFRAA >60 04/17/2014 1055    Lab Results  Component Value Date   WBC 5.1 07/23/2015   NEUTROABS 3.1 07/23/2015   HGB 12.5 07/23/2015   HCT 37.7 07/23/2015   MCV 87.0 07/23/2015   PLT 227 07/23/2015    No results found for: IRON    STUDIES: 01/24/15 - CT Chest.   IMPRESSION:  1. Progressive enlargement of right upper lobe nodule most consistent with a new primary malignancy. This nodule should be large enough to evaluate with PET-CT. Alternatively, tissue sampling could be attempted.  2. Mostly stable post treatment changes adjacent to the minor fissure within the right upper and middle lobes. There is a small nodular component within the adjacent lower lobe which has enlarged, although is irregular in shape and indeterminate.  3. No adenopathy.  01/30/15 - PET scan  IMPRESSION:  1. Hypermetabolic and enlarging right upper lobe pulmonary nodules  concerning for lung cancer recurrence.  2. Small focus of peripheral nodular consolidation in the superior segment of the right lower lobe with differential including inflammation or infection versus neoplasm. Favor information or infection; however recommend attention on follow-up.  3. No evidence of mediastinal nodal metastasis.    ASSESSMENT / PLAN:   Non small cell lung cancer, s/p radiation therapy. CT chest in March 2014 showed slight increase in size of lung nodule. June 2016 - CT scan and PET scan showing hypermetabolic an enlarging right upper lobe lung nodule concerning for recurrence. Attempt at radiofrequency ablation in September 2016 was aborted due to decrease in size of the nodule. -   So far clinically Mrs. Magee appears to be stable. We will repeat CT of the chest with contrast to monitor the right upper lobe nodule. If stable in size, we will continue to monitor her clinically and we'll repeat the CT of the chest in 3 months. If there is progression in size, we will discuss this issue with our interventional radiology colleagues. In between visits, she was advised to call in case of any new symptoms or sickness and will be evaluated sooner. The  patient is agreeable to this plan.   Return to the clinic in 3 months, after CT of the chest.  Roxana Hires, MD   07/23/2015 12:01 PM

## 2015-08-01 ENCOUNTER — Ambulatory Visit
Admission: RE | Admit: 2015-08-01 | Discharge: 2015-08-01 | Disposition: A | Discharge status: Home / Self Care | Payer: Medicaid Other | Source: Ambulatory Visit | Attending: Internal Medicine | Admitting: Internal Medicine

## 2015-08-01 DIAGNOSIS — C3491 Malignant neoplasm of unspecified part of right bronchus or lung: Secondary | ICD-10-CM

## 2015-08-01 DIAGNOSIS — R911 Solitary pulmonary nodule: Secondary | ICD-10-CM | POA: Diagnosis not present

## 2015-08-01 DIAGNOSIS — Z923 Personal history of irradiation: Secondary | ICD-10-CM | POA: Diagnosis not present

## 2015-08-01 MED ORDER — IOHEXOL 300 MG/ML  SOLN
75.0000 mL | Freq: Once | INTRAMUSCULAR | Status: AC | PRN
  Administered 2015-08-01: 75 mL via INTRAVENOUS

## 2015-09-18 ENCOUNTER — Encounter: Payer: Self-pay | Admitting: *Deleted

## 2015-09-18 ENCOUNTER — Other Ambulatory Visit: Payer: Self-pay | Admitting: *Deleted

## 2015-09-18 DIAGNOSIS — C3411 Malignant neoplasm of upper lobe, right bronchus or lung: Secondary | ICD-10-CM

## 2015-10-22 ENCOUNTER — Other Ambulatory Visit: Payer: Self-pay | Admitting: Physician Assistant

## 2015-10-22 DIAGNOSIS — R059 Cough, unspecified: Secondary | ICD-10-CM

## 2015-10-22 DIAGNOSIS — R05 Cough: Secondary | ICD-10-CM

## 2015-10-24 ENCOUNTER — Other Ambulatory Visit: Payer: Self-pay | Admitting: Hematology and Oncology

## 2015-10-24 DIAGNOSIS — C3411 Malignant neoplasm of upper lobe, right bronchus or lung: Secondary | ICD-10-CM

## 2015-10-30 ENCOUNTER — Ambulatory Visit: Admission: RE | Admit: 2015-10-30 | Payer: Medicaid Other | Source: Ambulatory Visit

## 2015-11-01 ENCOUNTER — Ambulatory Visit
Admission: RE | Admit: 2015-11-01 | Discharge: 2015-11-01 | Disposition: A | Discharge status: Home / Self Care | Payer: Medicaid Other | Source: Ambulatory Visit | Attending: Hematology and Oncology | Admitting: Hematology and Oncology

## 2015-11-01 ENCOUNTER — Inpatient Hospital Stay: Payer: Medicaid Other | Attending: Hematology and Oncology | Admitting: Hematology and Oncology

## 2015-11-01 ENCOUNTER — Other Ambulatory Visit: Payer: Self-pay | Admitting: Hematology and Oncology

## 2015-11-01 VITALS — BP 123/87 | HR 114 | Temp 98.4°F | Resp 18 | Ht 59.0 in | Wt 108.0 lb

## 2015-11-01 DIAGNOSIS — R634 Abnormal weight loss: Secondary | ICD-10-CM | POA: Diagnosis not present

## 2015-11-01 DIAGNOSIS — J439 Emphysema, unspecified: Secondary | ICD-10-CM | POA: Insufficient documentation

## 2015-11-01 DIAGNOSIS — E7800 Pure hypercholesterolemia, unspecified: Secondary | ICD-10-CM

## 2015-11-01 DIAGNOSIS — R05 Cough: Secondary | ICD-10-CM | POA: Diagnosis not present

## 2015-11-01 DIAGNOSIS — C3411 Malignant neoplasm of upper lobe, right bronchus or lung: Secondary | ICD-10-CM | POA: Diagnosis not present

## 2015-11-01 DIAGNOSIS — J449 Chronic obstructive pulmonary disease, unspecified: Secondary | ICD-10-CM

## 2015-11-01 DIAGNOSIS — Z801 Family history of malignant neoplasm of trachea, bronchus and lung: Secondary | ICD-10-CM | POA: Diagnosis not present

## 2015-11-01 DIAGNOSIS — Z87891 Personal history of nicotine dependence: Secondary | ICD-10-CM

## 2015-11-01 DIAGNOSIS — I10 Essential (primary) hypertension: Secondary | ICD-10-CM | POA: Insufficient documentation

## 2015-11-01 DIAGNOSIS — M1990 Unspecified osteoarthritis, unspecified site: Secondary | ICD-10-CM | POA: Insufficient documentation

## 2015-11-01 DIAGNOSIS — Z9981 Dependence on supplemental oxygen: Secondary | ICD-10-CM | POA: Diagnosis not present

## 2015-11-01 DIAGNOSIS — Z79899 Other long term (current) drug therapy: Secondary | ICD-10-CM

## 2015-11-01 DIAGNOSIS — E119 Type 2 diabetes mellitus without complications: Secondary | ICD-10-CM | POA: Diagnosis not present

## 2015-11-01 NOTE — Progress Notes (Signed)
[name] day:  11/01/2015  Chief Complaint: Angela Jensen is a 60 y.o. female with a history of stage I lung cancer who is seen for reassessment.  HPI: The patient has a 40 pack year smoking history.  She presented in 07/2011 with a COPD exacerbation.  CXR revealed a nodule in the right apex.  Chest CT on 07/31/2011 revealed a 1.1 x 0.6 cm RUL nodule and a 9 mm inferior aspect RUL nodule and a 4 mm nodule in the anterior aspect of the inferior aspect of the RUL.  PET scan on 08/03/2011 revealed a 10-mm spiculated right apical pulmonary nodule (SUV 2.5) suspcious for lung cancer. Left ovary was prominent (3.9 x 2.4 cm).   CA-125 was 9.3 (normal).  She was seen by Dr. Genevive Bi.  FEV1 was 28% predicted which improved to 41% with bronchodilators.  She underwent CT guided biopsy of the superior and inferior RUL nodules on 12/30/2011.  Pathology revealed well differentiated adenocarcinoma of lung primary.  EGFR testing revealed no mutation.  She was seen by Dr. Baruch Gouty of radiation oncology.  She received IMRT 6000 cGy to both nodules from 01/26/2012 - 03/10/2012.  She was initially followed  in the oncology clinic by Dr. Ma Hillock.  PET scan on 11/23/2012 revealed a spiculated 8 mm right apical pulmonary nodule without metabolic activity. There was a 13 mm right upper lobe nodule with ill-defined margins along the minor fissure which had increased in conspicuity (SUV 1.8) compared to 08/03/2011.  This was felt to represent post radiation change versus malignancy.  There was overlying chest wall soft tissue thickening and mild increased metabolic activity.  There was a new 5 mm right apical pulmonary nodule with no metabolic activity (below the size for characterization by PET).  Followup chest CT chest was recommended in 3 months.  Chest CT on 01/24/2015 revealed progressive enlargement of the RUL nodule most consistent with a new primary malignancy. There were  stable post treatment changes adjacent to the minor fissure within the right upper and middle lobes. There was a small nodular component within the adjacent lower lobe which had enlarged (indeterminate). There was no adenopathy.  PET scan on 01/30/2015 revealed a hypermetabolic and enlarging RUL pulmonary nodules concerning for lung cancer recurrence.  There was a small focus of peripheral nodular consolidation in the superior segment of the right lower lobe with differential including inflammation/infection versus neoplasm.  There was no evidence of mediastinal nodal metastasis.  In 04/2015, there was an attempt at radiofrequency ablation of 1 enlarging RUL nodule.  At the time of the procedure, the nodule had shrunk to 5 mm from 9 mm.  The patient was last seen in the medical oncology clinic on 07/23/2015 by Dmitriy Berenzon.  At that time, she was doing well.  Chest CT on 08/01/2015 revealed complete resolution of the prior nodule in the right lung apex.  There was scarring/radiation changes in the lateral right lung apex and lateral right middle lobe.  There was no evidence of recurrent disease.  During the interim, she notes cough and congestion x 2 months.  She has had 2 rounds of antibiotics.  She first received a Z-pack.  She is currently on a 7 day course of another antibiotic.  She has had no fever.  She has lost 4 pounds (122 to 108 pounds).  She describes no appetite.  She has had fluid and soups only.  She states that she use to eat  late at night, but not now.  Past Medical History  Diagnosis Date  . COPD (chronic obstructive pulmonary disease) ([place])   . Hypertension   . Hypercholesteremia   . Asthma   . Cancer [facility]) 2012    Right Lung CA  . Diabetes mellitus without complication [facility])     Patient takes Janumet  . Oxygen dependent     2L at nite   . Shortness of breath dyspnea     with exertion   . Depression   . Arthritis     Past Surgical History  Procedure Laterality Date   . Cesarean section      x3  . Lung biopsy  05 15 2013    has lung "spots"  . Cyst removal leg      and on shoulder   . Colonoscopy with propofol N/A 06/25/2015    Procedure: COLONOSCOPY WITH PROPOFOL;  Surgeon: Lucilla Lame, MD;  Location: [facility] ENDOSCOPY;  Service: Endoscopy;  Laterality: N/A;    Family History  Problem Relation Age of Onset  . Diabetes Mother   . Lung cancer Father   . Diabetes Sister   . Diabetes Maternal Grandmother   . Diabetes Paternal Grandmother   . Diabetes Sister     Social History:  reports that she quit smoking about 5 years ago. Her smoking use included Cigarettes. She has a 30 pack-year smoking history. She has quit using smokeless tobacco. Her smokeless tobacco use included Snuff. She reports that she uses illicit drugs (Marijuana). She reports that she does not drink alcohol.  She has not smoked for 1 month.  She started smoking at 17 until she was "56 something".  She lives in [place].  The patient is alone today.  Allergies: No Known Allergies  Current Medications: Current Outpatient Prescriptions  Medication Sig Dispense Refill  . albuterol (PROAIR HFA) 108 (90 BASE) MCG/ACT inhaler Inhale 2 puffs into the lungs every 4 (four) hours as needed for wheezing or shortness of breath.     Marland Kitchen albuterol (PROVENTIL) (2.5 MG/3ML) 0.083% nebulizer solution Take 2.5 mg by nebulization every 6 (six) hours as needed for wheezing.     Marland Kitchen amLODipine (NORVASC) 10 MG tablet Take 10 mg by mouth every morning.     Marland Kitchen buPROPion (WELLBUTRIN) 75 MG tablet Take 75 mg by mouth 2 (two) times daily.    Marland Kitchen gabapentin (NEURONTIN) 100 MG capsule Take 100 mg by mouth 2 (two) times daily.    Marland Kitchen lisinopril-hydrochlorothiazide (PRINZIDE,ZESTORETIC) 10-12.5 MG per tablet Take 1 tablet by mouth every morning.    . meloxicam (MOBIC) 7.5 MG tablet Take 7.5 mg by mouth 2 (two) times daily.    . mometasone-formoterol (DULERA) 200-5 MCG/ACT AERO Inhale 1 puff into the lungs 2 (two) times  daily.    . potassium chloride (K-DUR) 10 MEQ tablet Take 10 mEq by mouth every other day. Instructed to take 80mq daily from 9/6 - 9/11 then resume taking 162m every other day.    . Marland KitchenOPINIRole (REQUIP) 0.5 MG tablet Take 0.5 mg by mouth 2 (two) times daily.     . rosuvastatin (CRESTOR) 10 MG tablet Take 10 mg by mouth at bedtime.     . SitaGLIPtin-MetFORMIN HCl (JANUMET XR) 50-500 MG TB24 Take 1 tablet by mouth daily with breakfast.     . tiotropium (SPIRIVA HANDIHALER) 18 MCG inhalation capsule Place 18 mcg into inhaler and inhale daily.     No current facility-administered medications for this visit.  Review of Systems:  GENERAL:  Feels fine.  No fever sor sweats.  Weight loss of 4 pounds. PERFORMANCE STATUS (ECOG):  1 HEENT:  No visual changes, runny nose, sore throat, mouth sores or tenderness. Lungs: Resolving "cold".  No shortness of breath or cough.  No hemoptysis. Cardiac:  No chest pain, palpitations, orthopnea, or PND. GI:  No appetite.  No nausea, vomiting, diarrhea, constipation, melena or hematochezia.  Prior colonoscopy. GU:  No urgency, frequency, dysuria, or hematuria. Musculoskeletal:  No back pain.  No joint pain.  No muscle tenderness. Extremities:  No pain or swelling. Skin:  No rashes or skin changes. Neuro:  No headache, numbness or weakness, balance or coordination issues. Endocrine:  No diabetes, thyroid issues, hot flashes or night sweats. Psych:  No mood changes, depression or anxiety. Pain:  No focal pain. Review of systems:  All other systems reviewed and found to be negative.  Physical Exam: Blood pressure 123/87, pulse 114, temperature 98.4 F (36.9 C), temperature source Tympanic, resp. rate 18, height '4\' 11"'  (1.499 m), weight 108 lb 0.4 oz (49 kg), SpO2 95 %. GENERAL:  Thin woman sitting comfortably in the exam room in no acute distress. MENTAL STATUS:  Alert and oriented to person, place and time. HEAD:  Long black hair with slight graying.   Normocephalic, atraumatic, face symmetric, no Cushingoid features. EYES:  Pupils equal round and reactive to light and accomodation.  No conjunctivitis or scleral icterus. ENT:  Oropharynx clear without lesion.  Edentulous. Tongue normal. Mucous membranes moist.  RESPIRATORY:  Sonorous breath sounds which clear with cough.  No rales, wheezes or rhonchi. CARDIOVASCULAR:  Regular rate and rhythm without murmur, rub or gallop. ABDOMEN:  Soft, non-tender, with active bowel sounds, and no hepatosplenomegaly.  No masses. SKIN:  No rashes, ulcers or lesions. EXTREMITIES: No edema, no skin discoloration or tenderness.  No palpable cords. LYMPH NODES: No palpable cervical, supraclavicular, axillary or inguinal adenopathy  NEUROLOGICAL: Unremarkable. PSYCH:  Appropriate.  No visits with results within 3 Day(s) from this visit. Latest known visit with results is:  Appointment on 07/23/2015  Component Date Value Ref Range Status  . WBC 07/23/2015 5.1  3.6 - 11.0 K/uL Final  . RBC 07/23/2015 4.34  3.80 - 5.20 MIL/uL Final  . Hemoglobin 07/23/2015 12.5  12.0 - 16.0 g/dL Final  . HCT 07/23/2015 37.7  35.0 - 47.0 % Final  . MCV 07/23/2015 87.0  80.0 - 100.0 fL Final  . MCH 07/23/2015 28.9  26.0 - 34.0 pg Final  . MCHC 07/23/2015 33.2  32.0 - 36.0 g/dL Final  . RDW 07/23/2015 13.4  11.5 - 14.5 % Final  . Platelets 07/23/2015 227  150 - 440 K/uL Final  . Neutrophils Relative % 07/23/2015 60   Final  . Neutro Abs 07/23/2015 3.1  1.4 - 6.5 K/uL Final  . Lymphocytes Relative 07/23/2015 27   Final  . Lymphs Abs 07/23/2015 1.4  1.0 - 3.6 K/uL Final  . Monocytes Relative 07/23/2015 10   Final  . Monocytes Absolute 07/23/2015 0.5  0.2 - 0.9 K/uL Final  . Eosinophils Relative 07/23/2015 2   Final  . Eosinophils Absolute 07/23/2015 0.1  0 - 0.7 K/uL Final  . Basophils Relative 07/23/2015 1   Final  . Basophils Absolute 07/23/2015 0.0  0 - 0.1 K/uL Final  . Sodium 07/23/2015 133* 135 - 145 mmol/L Final  .  Potassium 07/23/2015 3.4* 3.5 - 5.1 mmol/L Final  . Chloride 07/23/2015 97* 101 -  111 mmol/L Final  . CO2 07/23/2015 28  22 - 32 mmol/L Final  . Glucose, Bld 07/23/2015 122* 65 - 99 mg/dL Final  . BUN 07/23/2015 17  6 - 20 mg/dL Final  . Creatinine, Ser 07/23/2015 0.79  0.44 - 1.00 mg/dL Final  . Calcium 07/23/2015 8.9  8.9 - 10.3 mg/dL Final  . Total Protein 07/23/2015 7.7  6.5 - 8.1 g/dL Final  . Albumin 07/23/2015 4.0  3.5 - 5.0 g/dL Final  . AST 07/23/2015 24  15 - 41 U/L Final  . ALT 07/23/2015 25  14 - 54 U/L Final  . Alkaline Phosphatase 07/23/2015 62  38 - 126 U/L Final  . Total Bilirubin 07/23/2015 0.5  0.3 - 1.2 mg/dL Final  . GFR calc non Af Amer 07/23/2015 >60  >60 mL/min Final  . GFR calc Af Amer 07/23/2015 >60  >60 mL/min Final   Comment: (NOTE) The eGFR has been calculated using the CKD EPI equation. This calculation has not been validated in all clinical situations. eGFR's persistently <60 mL/min signify possible Chronic Kidney Disease.   . Anion gap 07/23/2015 8  5 - 15 Final    Assessment:  Angela Jensen is a 60 y.o. 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   She was not a good surgery candidate secondary to her lung function (FEV1 28%).  Chest CT on 07/31/2011 revealed a 1.1 x 0.6 cm RUL nodule and a 9 mm inferior aspect RUL nodule and a 4 mm nodule in the anterior aspect of the inferior aspect of the RUL.  PET scan on 08/03/2011 revealed a 10-mm spiculated right apical pulmonary nodule (SUV 2.5) suspcious for lung cancer. Left ovary was prominent (3.9 x 2.4 cm).   CA-125 was 9.3 (normal).  CT guided biopsy of the superior and inferior RUL nodules on 12/30/2011 revealed well differentiated adenocarcinoma of lung primary.  EGFR testing revealed no mutation.  She received IMRT 6000 cGy to both nodules from 01/26/2012 - 03/10/2012.  PET scan on 01/30/2015 revealed a hypermetabolic and enlarging RUL pulmonary nodules  concerning for lung cancer recurrence.  There was a small focus of peripheral nodular consolidation in the superior segment of the right lower lobe with differential including inflammation/infection versus neoplasm.  There was no evidence of mediastinal nodal metastasis.  In 04/2015, there was an attempt at radiofrequency ablation of 1 enlarging RUL nodule.  At the time of the procedure, the nodule had shrunk to 5 mm from 9 mm.  Chest CT on 08/01/2015 revealed complete resolution of the prior nodule in the right lung apex.  There was scarring/radiation changes in the lateral right lung apex and lateral right middle lobe.  There was no evidence of recurrent disease.  Symptomatically, she notes cough and congestion x 2 months.  She has had 2 rounds of antibiotics.  She has lost 4 pounds (122 to 108 pounds).  Exam reveals sonorous breath sounds which clear with cough.  Plan: 1. Review entire medical history, diagnosis and management of lung cancer, imaging studies. 2. Labs today:  CBC with diff, CMP, CEA. 3. CXR (PA and lateral) today. 4. Chest CT without contrast on 01/30/2016. 5. RTC after chest CT for MD assessment, labs (CBC with diff, CMP, CEA), and review of CT scan.  Addendum:  CXR today reveals emphysema without acute disease.   Lequita Asal, MD  11/01/2015, 10:46 AM

## 2015-11-04 ENCOUNTER — Other Ambulatory Visit: Payer: Self-pay

## 2015-11-04 DIAGNOSIS — C3411 Malignant neoplasm of upper lobe, right bronchus or lung: Secondary | ICD-10-CM

## 2015-11-14 ENCOUNTER — Encounter: Payer: Self-pay | Admitting: Hematology and Oncology

## 2016-01-30 ENCOUNTER — Ambulatory Visit: Payer: Medicaid Other

## 2016-01-31 ENCOUNTER — Ambulatory Visit
Admission: RE | Admit: 2016-01-31 | Discharge: 2016-01-31 | Disposition: A | Discharge status: Home / Self Care | Payer: Medicaid Other | Source: Ambulatory Visit | Attending: Hematology and Oncology | Admitting: Hematology and Oncology

## 2016-01-31 ENCOUNTER — Inpatient Hospital Stay (HOSPITAL_BASED_OUTPATIENT_CLINIC_OR_DEPARTMENT_OTHER): Payer: Medicaid Other | Admitting: Hematology and Oncology

## 2016-01-31 ENCOUNTER — Inpatient Hospital Stay: Payer: Medicaid Other | Attending: Hematology and Oncology

## 2016-01-31 VITALS — BP 113/74 | HR 92 | Temp 97.9°F | Resp 17 | Ht 59.0 in | Wt 111.6 lb

## 2016-01-31 DIAGNOSIS — J439 Emphysema, unspecified: Secondary | ICD-10-CM | POA: Diagnosis not present

## 2016-01-31 DIAGNOSIS — J45909 Unspecified asthma, uncomplicated: Secondary | ICD-10-CM | POA: Insufficient documentation

## 2016-01-31 DIAGNOSIS — C3411 Malignant neoplasm of upper lobe, right bronchus or lung: Secondary | ICD-10-CM

## 2016-01-31 DIAGNOSIS — E119 Type 2 diabetes mellitus without complications: Secondary | ICD-10-CM

## 2016-01-31 DIAGNOSIS — Z7984 Long term (current) use of oral hypoglycemic drugs: Secondary | ICD-10-CM | POA: Insufficient documentation

## 2016-01-31 DIAGNOSIS — Z87891 Personal history of nicotine dependence: Secondary | ICD-10-CM

## 2016-01-31 DIAGNOSIS — Z801 Family history of malignant neoplasm of trachea, bronchus and lung: Secondary | ICD-10-CM | POA: Diagnosis not present

## 2016-01-31 DIAGNOSIS — R0602 Shortness of breath: Secondary | ICD-10-CM | POA: Insufficient documentation

## 2016-01-31 DIAGNOSIS — Z923 Personal history of irradiation: Secondary | ICD-10-CM

## 2016-01-31 DIAGNOSIS — Z79899 Other long term (current) drug therapy: Secondary | ICD-10-CM | POA: Diagnosis not present

## 2016-01-31 DIAGNOSIS — R634 Abnormal weight loss: Secondary | ICD-10-CM | POA: Insufficient documentation

## 2016-01-31 DIAGNOSIS — E7800 Pure hypercholesterolemia, unspecified: Secondary | ICD-10-CM | POA: Insufficient documentation

## 2016-01-31 DIAGNOSIS — M129 Arthropathy, unspecified: Secondary | ICD-10-CM

## 2016-01-31 DIAGNOSIS — J449 Chronic obstructive pulmonary disease, unspecified: Secondary | ICD-10-CM | POA: Diagnosis not present

## 2016-01-31 DIAGNOSIS — Z9981 Dependence on supplemental oxygen: Secondary | ICD-10-CM | POA: Insufficient documentation

## 2016-01-31 DIAGNOSIS — I10 Essential (primary) hypertension: Secondary | ICD-10-CM | POA: Insufficient documentation

## 2016-01-31 DIAGNOSIS — F329 Major depressive disorder, single episode, unspecified: Secondary | ICD-10-CM | POA: Diagnosis not present

## 2016-01-31 DIAGNOSIS — R970 Elevated carcinoembryonic antigen [CEA]: Secondary | ICD-10-CM | POA: Insufficient documentation

## 2016-01-31 LAB — COMPREHENSIVE METABOLIC PANEL
ALT: 23 U/L (ref 14–54)
AST: 28 U/L (ref 15–41)
Albumin: 4.5 g/dL (ref 3.5–5.0)
Alkaline Phosphatase: 57 U/L (ref 38–126)
Anion gap: 6 (ref 5–15)
BUN: 17 mg/dL (ref 6–20)
CO2: 31 mmol/L (ref 22–32)
Calcium: 9.4 mg/dL (ref 8.9–10.3)
Chloride: 100 mmol/L — ABNORMAL LOW (ref 101–111)
Creatinine, Ser: 0.87 mg/dL (ref 0.44–1.00)
GFR calc Af Amer: >60 mL/min (ref >60)
GFR calc non Af Amer: >60 mL/min (ref >60)
Glucose, Bld: 92 mg/dL (ref 65–99)
Potassium: 3.5 mmol/L (ref 3.5–5.1)
Sodium: 137 mmol/L (ref 135–145)
Total Bilirubin: 0.5 mg/dL (ref 0.3–1.2)
Total Protein: 7.8 g/dL (ref 6.5–8.1)

## 2016-01-31 LAB — CBC WITH DIFFERENTIAL/PLATELET
Basophils Absolute: 0 10*3/uL (ref 0–0.1)
Basophils Relative: 1 %
Eosinophils Absolute: 0.1 10*3/uL (ref 0–0.7)
Eosinophils Relative: 1 %
HCT: 37.8 % (ref 35.0–47.0)
Hemoglobin: 12.8 g/dL (ref 12.0–16.0)
Lymphocytes Relative: 24 %
Lymphs Abs: 1.3 10*3/uL (ref 1.0–3.6)
MCH: 29.8 pg (ref 26.0–34.0)
MCHC: 33.8 g/dL (ref 32.0–36.0)
MCV: 88 fL (ref 80.0–100.0)
Monocytes Absolute: 0.5 10*3/uL (ref 0.2–0.9)
Monocytes Relative: 9 %
Neutro Abs: 3.7 10*3/uL (ref 1.4–6.5)
Neutrophils Relative %: 65 %
Platelets: 218 10*3/uL (ref 150–440)
RBC: 4.29 MIL/uL (ref 3.80–5.20)
RDW: 13.8 % (ref 11.5–14.5)
WBC: 5.7 10*3/uL (ref 3.6–11.0)

## 2016-01-31 NOTE — Progress Notes (Signed)
[place] [facility] day:  01/31/2016  Chief Complaint: Angela Jensen is a 60 y.o. female with a history of stage I lung cancer who is seen for 3 month assessment and review of interval chest CT.  HPI: The patient was last seen in the medical oncology clinic on 11/01/2015.  At that time, she was seen for initial assessment by me.  She noted cough and congestion x 2 months.  She had 2 rounds of antibiotics.  She had lost 6 pounds (114 to 108 pounds).  Exam revealed sonorous breath sounds which clear with cough.  CXR revealed emphysema without acute disease.    Per prior plan, she underwent chest CT today.  Chest CT revealed no active cardiopulmonary abnormalities.  There was stable appearance of right apical and right middle lobe scar like densities.  Symptomatically, she denies any new complaint.     Past Medical History  Diagnosis Date  . COPD (chronic obstructive pulmonary disease) ([place])   . Hypertension   . Hypercholesteremia   . Asthma   . Cancer [name]) 2012    Right Lung CA  . Diabetes mellitus without complication [facility])     Patient takes Janumet  . Oxygen dependent     2L at nite   . Shortness of breath dyspnea     with exertion   . Depression   . Arthritis     Past Surgical History  Procedure Laterality Date  . Cesarean section      x3  . Lung biopsy  05 15 2013    has lung "spots"  . Cyst removal leg      and on shoulder   . Colonoscopy with propofol N/A 06/25/2015    Procedure: COLONOSCOPY WITH PROPOFOL;  Surgeon: Lucilla Lame, MD;  Location: [facility] ENDOSCOPY;  Service: Endoscopy;  Laterality: N/A;    Family History  Problem Relation Age of Onset  . Diabetes Mother   . Lung cancer Father   . Diabetes Sister   . Diabetes Maternal Grandmother   . Diabetes Paternal Grandmother   . Diabetes Sister     Social History:  reports that she quit smoking about 5 years ago. Her smoking use included Cigarettes. She has a 30 pack-year  smoking history. She has quit using smokeless tobacco. Her smokeless tobacco use included Snuff. She reports that she uses illicit drugs (Marijuana). She reports that she does not drink alcohol.  She has not smoked for 1 month.  She started smoking at 17 until she was "61 something".  She lives in [place].  The patient is alone today.  Allergies: No Known Allergies  Current Medications: Current Outpatient Prescriptions  Medication Sig Dispense Refill  . albuterol (PROAIR HFA) 108 (90 BASE) MCG/ACT inhaler Inhale 2 puffs into the lungs every 4 (four) hours as needed for wheezing or shortness of breath.     Marland Kitchen albuterol (PROVENTIL) (2.5 MG/3ML) 0.083% nebulizer solution Take 2.5 mg by nebulization every 6 (six) hours as needed for wheezing.     Marland Kitchen amLODipine (NORVASC) 10 MG tablet Take 10 mg by mouth every morning.     Marland Kitchen buPROPion (WELLBUTRIN) 75 MG tablet Take 75 mg by mouth 2 (two) times daily.    Marland Kitchen gabapentin (NEURONTIN) 100 MG capsule Take 100 mg by mouth 2 (two) times daily.    Marland Kitchen lisinopril-hydrochlorothiazide (PRINZIDE,ZESTORETIC) 10-12.5 MG per tablet Take 1 tablet by mouth every morning.    . meloxicam (MOBIC) 7.5 MG tablet Take  7.5 mg by mouth 2 (two) times daily.    . mometasone-formoterol (DULERA) 200-5 MCG/ACT AERO Inhale 1 puff into the lungs 2 (two) times daily.    . potassium chloride (K-DUR) 10 MEQ tablet Take 10 mEq by mouth every other day. Instructed to take 7mq daily from 9/6 - 9/11 then resume taking 184m every other day.    . Marland KitchenOPINIRole (REQUIP) 0.5 MG tablet Take 0.5 mg by mouth 2 (two) times daily.     . rosuvastatin (CRESTOR) 10 MG tablet Take 10 mg by mouth at bedtime.     . SitaGLIPtin-MetFORMIN HCl (JANUMET XR) 50-500 MG TB24 Take 1 tablet by mouth daily with breakfast.     . tiotropium (SPIRIVA HANDIHALER) 18 MCG inhalation capsule Place 18 mcg into inhaler and inhale daily.     No current facility-administered medications for this visit.    Review of Systems:   GENERAL:  Feels fine.  No fevers or sweats.  Weight gain of 3 pounds. PERFORMANCE STATUS (ECOG):  1 HEENT:  No visual changes, runny nose, sore throat, mouth sores or tenderness. Lungs: No shortness of breath or cough.  No hemoptysis. Cardiac:  No chest pain, palpitations, orthopnea, or PND. GI:  No nausea, vomiting, diarrhea, constipation, melena or hematochezia.  Prior colonoscopy. GU:  No urgency, frequency, dysuria, or hematuria. Musculoskeletal:  No back pain.  No joint pain.  No muscle tenderness. Extremities:  No pain or swelling. Skin:  No rashes or skin changes. Neuro:  No headache, numbness or weakness, balance or coordination issues. Endocrine:  No diabetes, thyroid issues, hot flashes or night sweats. Psych:  No mood changes, depression or anxiety. Pain:  No focal pain. Review of systems:  All other systems reviewed and found to be negative.  Physical Exam: Blood pressure 113/74, pulse 92, temperature 97.9 F (36.6 C), temperature source Tympanic, resp. rate 17, height '4\' 11"'  (1.499 m), weight 111 lb 8.8 oz (50.6 kg), SpO2 97 %. GENERAL:  Thin woman sitting comfortably in the exam room in no acute distress. MENTAL STATUS:  Alert and oriented to person, place and time. HEAD:  Long black hair pulled back.  Normocephalic, atraumatic, face symmetric, no Cushingoid features. EYES:  Pupils equal round and reactive to light and accomodation.  No conjunctivitis or scleral icterus. ENT:  Oropharynx clear without lesion.  Edentulous. Tongue normal. Mucous membranes moist.  RESPIRATORY:  Clear to auscultation without rales, wheezes or rhonchi. CARDIOVASCULAR:  Regular rate and rhythm without murmur, rub or gallop. ABDOMEN:  Soft, non-tender, with active bowel sounds, and no hepatosplenomegaly.  No masses. SKIN:  No rashes, ulcers or lesions. EXTREMITIES: No edema, no skin discoloration or tenderness.  No palpable cords. LYMPH NODES: No palpable cervical, supraclavicular, axillary or  inguinal adenopathy  NEUROLOGICAL: Unremarkable. PSYCH:  Appropriate.   Appointment on 01/31/2016  Component Date Value Ref Range Status  . WBC 01/31/2016 5.7  3.6 - 11.0 K/uL Final  . RBC 01/31/2016 4.29  3.80 - 5.20 MIL/uL Final  . Hemoglobin 01/31/2016 12.8  12.0 - 16.0 g/dL Final  . HCT 01/31/2016 37.8  35.0 - 47.0 % Final  . MCV 01/31/2016 88.0  80.0 - 100.0 fL Final  . MCH 01/31/2016 29.8  26.0 - 34.0 pg Final  . MCHC 01/31/2016 33.8  32.0 - 36.0 g/dL Final  . RDW 01/31/2016 13.8  11.5 - 14.5 % Final  . Platelets 01/31/2016 218  150 - 440 K/uL Final  . Neutrophils Relative % 01/31/2016 65   Final  .  Neutro Abs 01/31/2016 3.7  1.4 - 6.5 K/uL Final  . Lymphocytes Relative 01/31/2016 24   Final  . Lymphs Abs 01/31/2016 1.3  1.0 - 3.6 K/uL Final  . Monocytes Relative 01/31/2016 9   Final  . Monocytes Absolute 01/31/2016 0.5  0.2 - 0.9 K/uL Final  . Eosinophils Relative 01/31/2016 1   Final  . Eosinophils Absolute 01/31/2016 0.1  0 - 0.7 K/uL Final  . Basophils Relative 01/31/2016 1   Final  . Basophils Absolute 01/31/2016 0.0  0 - 0.1 K/uL Final  . Sodium 01/31/2016 137  135 - 145 mmol/L Final  . Potassium 01/31/2016 3.5  3.5 - 5.1 mmol/L Final  . Chloride 01/31/2016 100* 101 - 111 mmol/L Final  . CO2 01/31/2016 31  22 - 32 mmol/L Final  . Glucose, Bld 01/31/2016 92  65 - 99 mg/dL Final  . BUN 01/31/2016 17  6 - 20 mg/dL Final  . Creatinine, Ser 01/31/2016 0.87  0.44 - 1.00 mg/dL Final  . Calcium 01/31/2016 9.4  8.9 - 10.3 mg/dL Final  . Total Protein 01/31/2016 7.8  6.5 - 8.1 g/dL Final  . Albumin 01/31/2016 4.5  3.5 - 5.0 g/dL Final  . AST 01/31/2016 28  15 - 41 U/L Final  . ALT 01/31/2016 23  14 - 54 U/L Final  . Alkaline Phosphatase 01/31/2016 57  38 - 126 U/L Final  . Total Bilirubin 01/31/2016 0.5  0.3 - 1.2 mg/dL Final  . GFR calc non Af Amer 01/31/2016 >60  >60 mL/min Final  . GFR calc Af Amer 01/31/2016 >60  >60 mL/min Final   Comment: (NOTE) The eGFR has been  calculated using the CKD EPI equation. This calculation has not been validated in all clinical situations. eGFR's persistently <60 mL/min signify possible Chronic Kidney Disease.   Georgiann Hahn gap 01/31/2016 6  5 - 15 Final    Assessment:  KEAUNNA SKIPPER is a 60 y.o. 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   She was not a good surgery candidate secondary to her lung function (FEV1 28%).  Chest CT on 07/31/2011 revealed a 1.1 x 0.6 cm RUL nodule and a 9 mm inferior aspect RUL nodule and a 4 mm nodule in the anterior aspect of the inferior aspect of the RUL.  PET scan on 08/03/2011 revealed a 10-mm spiculated right apical pulmonary nodule (SUV 2.5) suspcious for lung cancer. Left ovary was prominent (3.9 x 2.4 cm).   CA-125 was 9.3 (normal).  CT guided biopsy of the superior and inferior RUL nodules on 12/30/2011 revealed well differentiated adenocarcinoma of lung primary.  EGFR testing revealed no mutation.  She received IMRT 6000 cGy to both nodules from 01/26/2012 - 03/10/2012.  PET scan on 01/30/2015 revealed a hypermetabolic and enlarging RUL pulmonary nodules concerning for lung cancer recurrence.  There was a small focus of peripheral nodular consolidation in the superior segment of the right lower lobe with differential including inflammation/infection versus neoplasm.  There was no evidence of mediastinal nodal metastasis.  In 04/2015, there was an attempt at radiofrequency ablation of 1 enlarging RUL nodule.  At the time of the procedure, the nodule had shrunk to 5 mm from 9 mm.  Chest CT on 08/01/2015 revealed complete resolution of the prior nodule in the right lung apex.  Chest CT on 01/31/2016 revealed no active cardiopulmonary abnormalities.  There was stable appearance of right apical and right middle lobe  scar like densities.  Symptomatically, she denies any complaint.  She has gained 3 pounds.  Exam is unremarkable.  CEA is 8.1  (high).  Plan: 1.  Labs today:  CBC with diff, CMP, CEA. 2.  Review interval chest CT.  No evidence of recurrent disease. 3.  Discuss plan for follow-up imaging (chest CT every 6 months for 2-3 years (complete) then annual low dose screening chest CT). 4.  RTC in 6 months for MD assessment and CXR (prior to visit) 5.  RTC in 1 year for MD assessment, labs (CBC with diff, CMP), and chest CT without contrast day before.  Addendum:  CEA returned elevated (8.1) after her visit.  Patient will be contacted.  CEA will be repeated in 1 month.  If CEA remains elevated, imaging studies will be ordered.   Lequita Asal, MD  01/31/2016, 3:16 PM

## 2016-01-31 NOTE — Progress Notes (Signed)
Had CT scan today.  No other concerns

## 2016-01-31 NOTE — Patient Instructions (Signed)
On patient's Dec 2017 visit with Dr. Mike Gip please get chest xray before coming to the office.- probably about 1 hour before coming to see md that day.

## 2016-02-01 LAB — CEA: CEA: 8.1 ng/mL — ABNORMAL HIGH (ref 0.0–4.7)

## 2016-02-04 ENCOUNTER — Other Ambulatory Visit: Payer: Self-pay

## 2016-02-04 ENCOUNTER — Telehealth: Payer: Self-pay

## 2016-02-04 DIAGNOSIS — C3491 Malignant neoplasm of unspecified part of right bronchus or lung: Secondary | ICD-10-CM

## 2016-02-04 NOTE — Telephone Encounter (Signed)
Called pt per MD to report CEA was slightly elevated at last visit. With No prior CEA's drawn.  Let's repeat in 1 month (around 03/01/2016).  Pt verbalized an agreement no other concerns noted.   Scheduling message sent

## 2016-03-03 ENCOUNTER — Inpatient Hospital Stay: Payer: Medicaid Other | Attending: Hematology and Oncology

## 2016-03-03 DIAGNOSIS — C3411 Malignant neoplasm of upper lobe, right bronchus or lung: Secondary | ICD-10-CM | POA: Diagnosis present

## 2016-03-03 DIAGNOSIS — C3491 Malignant neoplasm of unspecified part of right bronchus or lung: Secondary | ICD-10-CM

## 2016-03-03 LAB — CBC WITH DIFFERENTIAL/PLATELET
Basophils Absolute: 0 10*3/uL (ref 0–0.1)
Basophils Relative: 1 %
Eosinophils Absolute: 0.1 10*3/uL (ref 0–0.7)
Eosinophils Relative: 1 %
HCT: 43.1 % (ref 35.0–47.0)
Hemoglobin: 14.6 g/dL (ref 12.0–16.0)
Lymphocytes Relative: 24 %
Lymphs Abs: 1.1 10*3/uL (ref 1.0–3.6)
MCH: 29.9 pg (ref 26.0–34.0)
MCHC: 33.8 g/dL (ref 32.0–36.0)
MCV: 88.3 fL (ref 80.0–100.0)
Monocytes Absolute: 0.3 10*3/uL (ref 0.2–0.9)
Monocytes Relative: 7 %
Neutro Abs: 3.1 10*3/uL (ref 1.4–6.5)
Neutrophils Relative %: 67 %
Platelets: 244 10*3/uL (ref 150–440)
RBC: 4.88 MIL/uL (ref 3.80–5.20)
RDW: 13.4 % (ref 11.5–14.5)
WBC: 4.7 10*3/uL (ref 3.6–11.0)

## 2016-03-03 LAB — COMPREHENSIVE METABOLIC PANEL
ALT: 21 U/L (ref 14–54)
AST: 25 U/L (ref 15–41)
Albumin: 4.8 g/dL (ref 3.5–5.0)
Alkaline Phosphatase: 64 U/L (ref 38–126)
Anion gap: 6 (ref 5–15)
BUN: 22 mg/dL — ABNORMAL HIGH (ref 6–20)
CO2: 31 mmol/L (ref 22–32)
Calcium: 9.7 mg/dL (ref 8.9–10.3)
Chloride: 101 mmol/L (ref 101–111)
Creatinine, Ser: 0.82 mg/dL (ref 0.44–1.00)
GFR calc Af Amer: >60 mL/min (ref >60)
GFR calc non Af Amer: >60 mL/min (ref >60)
Glucose, Bld: 106 mg/dL — ABNORMAL HIGH (ref 65–99)
Potassium: 3.6 mmol/L (ref 3.5–5.1)
Sodium: 138 mmol/L (ref 135–145)
Total Bilirubin: 0.5 mg/dL (ref 0.3–1.2)
Total Protein: 8.8 g/dL — ABNORMAL HIGH (ref 6.5–8.1)

## 2016-03-04 ENCOUNTER — Telehealth: Payer: Self-pay | Admitting: *Deleted

## 2016-03-04 ENCOUNTER — Other Ambulatory Visit: Payer: Self-pay | Admitting: Hematology and Oncology

## 2016-03-04 DIAGNOSIS — C3411 Malignant neoplasm of upper lobe, right bronchus or lung: Secondary | ICD-10-CM

## 2016-03-04 DIAGNOSIS — R970 Elevated carcinoembryonic antigen [CEA]: Secondary | ICD-10-CM

## 2016-03-04 LAB — CEA: CEA: 9.2 ng/mL — ABNORMAL HIGH (ref 0.0–4.7)

## 2016-03-04 NOTE — Telephone Encounter (Signed)
Called patient at both numbers and no answer, left message on her cell phone that wanted to speak with pt about her labs results and what corcoran would like to do next. Please call me back and left my number is [place] for today only and otherwise call main number in [place].

## 2016-03-04 NOTE — Telephone Encounter (Signed)
-----   Message from Lequita Asal, MD sent at 03/04/2016  5:12 AM EDT ----- Regarding: Please call patient  CEA remains abnormal.  Will try to get patient scan approved.  Order in.  M   ----- Message -----    From: Lab In [place]: 03/03/2016  10:28 AM      To: Lequita Asal, MD

## 2016-03-09 ENCOUNTER — Telehealth: Payer: Self-pay | Admitting: *Deleted

## 2016-03-09 ENCOUNTER — Telehealth: Payer: Self-pay | Admitting: Hematology and Oncology

## 2016-03-09 NOTE — Telephone Encounter (Signed)
Pt called back and spoke to [place] and I explained to her the results of CEA and it had inc. Since 1 month ago . Md would like to scan her with PET scan to check and see if anything shows up on it. We want to make sure the status of her cancer and this test is a great way to see if anything is going on. She is agreeable to this. I told her that the sch. Staff will call her with date and time and I will check to see if pt needs f/u appt after scan to see md and when sch. Calls she will know the answer to scan date and if f/u appt needed.

## 2016-03-09 NOTE — Telephone Encounter (Signed)
Patient said she would like to talk to nurse about her previous blood work. She said nurse had left her a voicemail about it last week but she wasn't sure she understood everything. Please call again: [phone]. Thanks!

## 2016-03-09 NOTE — Telephone Encounter (Signed)
Called asking for results. Her CEA has gone up to 9.2 from 8.1. Please call her to discuss

## 2016-03-09 NOTE — Telephone Encounter (Signed)
I called her this am because she called here in [place] and there is a note in about it.

## 2016-03-23 ENCOUNTER — Encounter: Payer: Self-pay | Admitting: Hematology and Oncology

## 2016-03-23 ENCOUNTER — Ambulatory Visit: Payer: Medicaid Other

## 2016-03-26 ENCOUNTER — Ambulatory Visit
Admission: RE | Admit: 2016-03-26 | Discharge: 2016-03-26 | Disposition: A | Discharge status: Home / Self Care | Payer: Medicaid Other | Source: Ambulatory Visit | Attending: Hematology and Oncology | Admitting: Hematology and Oncology

## 2016-03-26 DIAGNOSIS — J432 Centrilobular emphysema: Secondary | ICD-10-CM | POA: Insufficient documentation

## 2016-03-26 DIAGNOSIS — M533 Sacrococcygeal disorders, not elsewhere classified: Secondary | ICD-10-CM | POA: Diagnosis not present

## 2016-03-26 DIAGNOSIS — R970 Elevated carcinoembryonic antigen [CEA]: Secondary | ICD-10-CM | POA: Insufficient documentation

## 2016-03-26 DIAGNOSIS — C3411 Malignant neoplasm of upper lobe, right bronchus or lung: Secondary | ICD-10-CM | POA: Insufficient documentation

## 2016-03-26 DIAGNOSIS — I7090 Unspecified atherosclerosis: Secondary | ICD-10-CM | POA: Diagnosis not present

## 2016-03-26 DIAGNOSIS — I700 Atherosclerosis of aorta: Secondary | ICD-10-CM | POA: Diagnosis not present

## 2016-03-26 LAB — GLUCOSE, CAPILLARY: Glucose-Capillary: 104 mg/dL — ABNORMAL HIGH (ref 65–99)

## 2016-03-26 MED ORDER — FLUDEOXYGLUCOSE F - 18 (FDG) INJECTION
12.4200 | Freq: Once | INTRAVENOUS | Status: AC | PRN
  Administered 2016-03-26: 12.42 via INTRAVENOUS

## 2016-03-27 ENCOUNTER — Telehealth: Payer: Self-pay | Admitting: *Deleted

## 2016-03-27 ENCOUNTER — Inpatient Hospital Stay: Payer: Medicaid Other | Admitting: Hematology and Oncology

## 2016-03-27 NOTE — Progress Notes (Deleted)
[place] [facility] day:  03/27/16  Chief Complaint: Angela Jensen is a 60 y.o. female with a history of stage I lung cancer who is seen for review of PET scan.  HPI: The patient was last seen in the medical oncology clinic on 01/31/2016.  At that time, she denied any complaint.  Exam was unremarkable.  CEA was 8.1 (high).  Decision was made to repeat CEA.  CEA was 9.2 on 03/03/2016.  A PET scan was ordered.   Symptomatically, she denies any new complaint.     Past Medical History:  Diagnosis Date  . Arthritis   . Asthma   . Cancer [facility]) 2012   Right Lung CA  . COPD (chronic obstructive pulmonary disease) ([place])   . Depression   . Diabetes mellitus without complication [facility])    Patient takes Janumet  . Hypercholesteremia   . Hypertension   . 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 Shortness of breath dyspnea    with exertion     Past Surgical History:  Procedure Laterality Date  . CESAREAN SECTION     x3  . COLONOSCOPY WITH PROPOFOL N/A 06/25/2015   Procedure: COLONOSCOPY WITH PROPOFOL;  Surgeon: Lucilla Lame, MD;  Location: [facility] ENDOSCOPY;  Service: Endoscopy;  Laterality: N/A;  . CYST REMOVAL LEG     and on shoulder   . LUNG BIOPSY  05 15 2013   has lung "spots"    Family History  Problem Relation Age of Onset  . Diabetes Mother   . Lung cancer Father   . Diabetes Sister   . Diabetes Maternal Grandmother   . Diabetes Paternal Grandmother   . Diabetes Sister     Social History:  reports that she quit smoking about 6 years ago. Her smoking use included Cigarettes. She has a 30.00 pack-year smoking history. She has quit using smokeless tobacco. Her smokeless tobacco use included Snuff. She reports that she uses drugs, including Marijuana. She reports that she does not drink alcohol.  She has not smoked for 1 month.  She started smoking at 17 until she was "89 something".  She lives in [place].  The patient is alone today.  Allergies:  No Known Allergies  Current Medications: Current Outpatient Prescriptions  Medication Sig Dispense Refill  . albuterol (PROAIR HFA) 108 (90 BASE) MCG/ACT inhaler Inhale 2 puffs into the lungs every 4 (four) hours as needed for wheezing or shortness of breath.     Marland Kitchen albuterol (PROVENTIL) (2.5 MG/3ML) 0.083% nebulizer solution Take 2.5 mg by nebulization every 6 (six) hours as needed for wheezing.     Marland Kitchen amLODipine (NORVASC) 10 MG tablet Take 10 mg by mouth every morning.     Marland Kitchen buPROPion (WELLBUTRIN) 75 MG tablet Take 75 mg by mouth 2 (two) times daily.    Marland Kitchen gabapentin (NEURONTIN) 100 MG capsule Take 100 mg by mouth 2 (two) times daily.    Marland Kitchen lisinopril-hydrochlorothiazide (PRINZIDE,ZESTORETIC) 10-12.5 MG per tablet Take 1 tablet by mouth every morning.    . meloxicam (MOBIC) 7.5 MG tablet Take 7.5 mg by mouth 2 (two) times daily.    . mometasone-formoterol (DULERA) 200-5 MCG/ACT AERO Inhale 1 puff into the lungs 2 (two) times daily.    . potassium chloride (K-DUR) 10 MEQ tablet Take 10 mEq by mouth every other day. Instructed to take 59mq daily from 9/6 - 9/11 then resume taking 171m every other day.    .Marland Kitchen  rOPINIRole (REQUIP) 0.5 MG tablet Take 0.5 mg by mouth 2 (two) times daily.     . rosuvastatin (CRESTOR) 10 MG tablet Take 10 mg by mouth at bedtime.     . SitaGLIPtin-MetFORMIN HCl (JANUMET XR) 50-500 MG TB24 Take 1 tablet by mouth daily with breakfast.     . tiotropium (SPIRIVA HANDIHALER) 18 MCG inhalation capsule Place 18 mcg into inhaler and inhale daily.     No current facility-administered medications for this visit.     Review of Systems:  GENERAL:  Feels fine.  No fevers or sweats.  Weight gain of 3 pounds. PERFORMANCE STATUS (ECOG):  1 HEENT:  No visual changes, runny nose, sore throat, mouth sores or tenderness. Lungs: No shortness of breath or cough.  No hemoptysis. Cardiac:  No chest pain, palpitations, orthopnea, or PND. GI:  No nausea, vomiting, diarrhea, constipation,  melena or hematochezia.  Prior colonoscopy. GU:  No urgency, frequency, dysuria, or hematuria. Musculoskeletal:  No back pain.  No joint pain.  No muscle tenderness. Extremities:  No pain or swelling. Skin:  No rashes or skin changes. Neuro:  No headache, numbness or weakness, balance or coordination issues. Endocrine:  No diabetes, thyroid issues, hot flashes or night sweats. Psych:  No mood changes, depression or anxiety. Pain:  No focal pain. Review of systems:  All other systems reviewed and found to be negative.  Physical Exam: There were no vitals taken for this visit. GENERAL:  Thin woman sitting comfortably in the exam room in no acute distress. MENTAL STATUS:  Alert and oriented to person, place and time. HEAD:  Long black hair pulled back.  Normocephalic, atraumatic, face symmetric, no Cushingoid features. EYES:  Pupils equal round and reactive to light and accomodation.  No conjunctivitis or scleral icterus. ENT:  Oropharynx clear without lesion.  Edentulous. Tongue normal. Mucous membranes moist.  RESPIRATORY:  Clear to auscultation without rales, wheezes or rhonchi. CARDIOVASCULAR:  Regular rate and rhythm without murmur, rub or gallop. ABDOMEN:  Soft, non-tender, with active bowel sounds, and no hepatosplenomegaly.  No masses. SKIN:  No rashes, ulcers or lesions. EXTREMITIES: No edema, no skin discoloration or tenderness.  No palpable cords. LYMPH NODES: No palpable cervical, supraclavicular, axillary or inguinal adenopathy  NEUROLOGICAL: Unremarkable. PSYCH:  Appropriate.   Hospital Outpatient Visit on 03/26/2016  Component Date Value Ref Range Status  . Glucose-Capillary 03/26/2016 104* 65 - 99 mg/dL Final    Assessment:  Angela Jensen is a 60 y.o. 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   She was not a good surgery candidate secondary to her lung function (FEV1 28%).  Chest CT on 07/31/2011 revealed a 1.1 x 0.6 cm  RUL nodule and a 9 mm inferior aspect RUL nodule and a 4 mm nodule in the anterior aspect of the inferior aspect of the RUL.  PET scan on 08/03/2011 revealed a 10-mm spiculated right apical pulmonary nodule (SUV 2.5) suspcious for lung cancer. Left ovary was prominent (3.9 x 2.4 cm).   CA-125 was 9.3 (normal).  CT guided biopsy of the superior and inferior RUL nodules on 12/30/2011 revealed well differentiated adenocarcinoma of lung primary.  EGFR testing revealed no mutation.  She received IMRT 6000 cGy to both nodules from 01/26/2012 - 03/10/2012.  PET scan on 01/30/2015 revealed a hypermetabolic and enlarging RUL pulmonary nodules concerning for lung cancer recurrence.  There was a small focus of peripheral nodular consolidation in the superior  segment of the right lower lobe with differential including inflammation/infection versus neoplasm.  There was no evidence of mediastinal nodal metastasis.  In 04/2015, there was an attempt at radiofrequency ablation of 1 enlarging RUL nodule.  At the time of the procedure, the nodule had shrunk to 5 mm from 9 mm.  Chest CT on 08/01/2015 revealed complete resolution of the prior nodule in the right lung apex.  Chest CT on 01/31/2016 revealed no active cardiopulmonary abnormalities.  There was stable appearance of right apical and right middle lobe scar like densities.  Symptomatically, she denies any complaint.  She has gained 3 pounds.  Exam is unremarkable.  CEA is 8.1 (high).  Plan: 1.  Labs today:  CBC with diff, CMP, CEA. 2.  Review interval chest CT.  No evidence of recurrent disease. 3.  Discuss plan for follow-up imaging (chest CT every 6 months for 2-3 years (complete) then annual low dose screening chest CT). 4.  RTC in 6 months for MD assessment and CXR (prior to visit) 5.  RTC in 1 year for MD assessment, labs (CBC with diff, CMP), and chest CT without contrast day before.  Addendum:  CEA returned elevated (8.1) after her visit.  Patient will  be contacted.  CEA will be repeated in 1 month.  If CEA remains elevated, imaging studies will be ordered.   Lequita Asal, MD  03/27/2016, 5:51 AM

## 2016-03-27 NOTE — Telephone Encounter (Signed)
Called pt to let her know that there is an IT problem that does not allow her scan to move over to another system in order for md to enter results and then get sent to dr Mike Gip.  IT dept working on it abut it will not get fixed today. Offered pt to come next week and she is fine with it. She has appt mon 10:45on [address] o I have asked Lattie Haw to put her in at 9:45 tosee corcoran on Monday to get results. Pt acceptable to this

## 2016-03-29 NOTE — Progress Notes (Signed)
Angela Jensen  Chief Complaint: Angela Jensen is a 60 y.o. female with a history of stage I lung cancer who is seen for review of PET scan.  HPI: The patient was last seen in the medical oncology clinic on 01/31/2016.  At that time, she denied any complaint.  Exam was unremarkable.  CEA was 8.1 (high).  Decision was made to repeat CEA.  CEA was 9.2 on 03/03/2016.  A PET scan was ordered.  PET scan on 03/27/2016 revealed no hypermetabolic activity to suggest recurrent malignancy.  There was continued bandlike architectural distortion in the apical segment right upper lobe likely related to prior therapy.  There was a soft tissue density in the left pelvis near the inguinal ring that was not hypermetabolic, but was notably prominent and probably separate from the left ovary. This could be a wandering fibroid. Although the overall appearance has not changed from 2012 and was not hypermetabolic (suspicion for malignancy in this area is very low), pelvic sonography or pelvic MRI for further characterization of the uterus and ovaries can be considered.  There was a 1.5 by 1.0 cm calcification deep to the left mandible suspicious for chronic submandibular duct stone.  Patient had a colonoscopy by Dr. Allen Norris on 06/25/2015.  There was a 4 mm polyp in the sigmoid colon.  Pathology revealed a tubular adenoma without evidence of dysplasia or malignancy.  Symptomatically, she notes that her right lower side hurts.  She is eating well.  She denies any nausea or vomiting.  She denies any bleeding.   Past Medical History:  Diagnosis Date  . Arthritis   . Asthma   . Cancer [facility]) 2012   Right Lung CA  . COPD (chronic obstructive pulmonary disease) ([place])   . Depression   . Diabetes mellitus without complication [facility])    Patient takes Janumet  . Hypercholesteremia   . Hypertension   . 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 Shortness of breath dyspnea    with exertion     Past Surgical History:  Procedure Laterality Date  . CESAREAN SECTION     x3  . COLONOSCOPY WITH PROPOFOL N/A 06/25/2015   Procedure: COLONOSCOPY WITH PROPOFOL;  Surgeon: Lucilla Lame, MD;  Location: [facility] ENDOSCOPY;  Service: Endoscopy;  Laterality: N/A;  . CYST REMOVAL LEG     and on shoulder   . LUNG BIOPSY  05 15 2013   has lung "spots"    Family History  Problem Relation Age of Onset  . Diabetes Mother   . Lung cancer Father   . Diabetes Sister   . Diabetes Maternal Grandmother   . Diabetes Paternal Grandmother   . Diabetes Sister     Social History:  reports that she quit smoking about 6 years ago. Her smoking use included Cigarettes. She has a 30.00 pack-year smoking history. She has quit using smokeless tobacco. Her smokeless tobacco use included Snuff. She reports that she uses drugs, including Marijuana. She reports that she does not drink alcohol.  She has not smoked for 1 month.  She started smoking at 17 until she was "46 something".  She lives in [place].  The patient is alone today.  Allergies: No Known Allergies  Current Medications: Current Outpatient Prescriptions  Medication Sig Dispense Refill  . albuterol (PROAIR HFA) 108 (90 BASE) MCG/ACT inhaler Inhale 2 puffs into the lungs every 4 (four) hours as needed for wheezing  or shortness of breath.     Marland Kitchen albuterol (PROVENTIL) (2.5 MG/3ML) 0.083% nebulizer solution Take 2.5 mg by nebulization every 6 (six) hours as needed for wheezing.     Marland Kitchen amLODipine (NORVASC) 10 MG tablet Take 10 mg by mouth every morning.     Marland Kitchen buPROPion (WELLBUTRIN) 75 MG tablet Take 75 mg by mouth 2 (two) times daily.    Marland Kitchen gabapentin (NEURONTIN) 100 MG capsule Take 100 mg by mouth 2 (two) times daily.    Marland Kitchen lisinopril-hydrochlorothiazide (PRINZIDE,ZESTORETIC) 10-12.5 MG per tablet Take 1 tablet by mouth every morning.    . meloxicam (MOBIC) 7.5 MG tablet Take 7.5 mg by mouth 2 (two) times daily.    . mometasone-formoterol  (DULERA) 200-5 MCG/ACT AERO Inhale 1 puff into the lungs 2 (two) times daily.    . potassium chloride (K-DUR) 10 MEQ tablet Take 10 mEq by mouth every other day. Instructed to take 47mq daily from 9/6 - 9/11 then resume taking 140m every other day.    . Marland KitchenOPINIRole (REQUIP) 0.5 MG tablet Take 0.5 mg by mouth 2 (two) times daily.     . rosuvastatin (CRESTOR) 10 MG tablet Take 10 mg by mouth at bedtime.     . SitaGLIPtin-MetFORMIN HCl (JANUMET XR) 50-500 MG TB24 Take 1 tablet by mouth daily with breakfast.     . tiotropium (SPIRIVA HANDIHALER) 18 MCG inhalation capsule Place 18 mcg into inhaler and inhale daily.     No current facility-administered medications for this visit.     Review of Systems:  GENERAL:  Feels "ok".  No fevers or sweats.  Weight down 2 pounds. PERFORMANCE STATUS (ECOG):  1 HEENT:  No visual changes, runny nose, sore throat, mouth sores or tenderness. Lungs: No shortness of breath or cough.  No hemoptysis. Cardiac:  No chest pain, palpitations, orthopnea, or PND. GI:  Right lower side pain.  No nausea, vomiting, diarrhea, constipation, melena or hematochezia.  Prior colonoscopy. GU:  No urgency, frequency, dysuria, or hematuria. Musculoskeletal:  No back pain.  No joint pain.  No muscle tenderness. Extremities:  No pain or swelling. Skin:  No rashes or skin changes. Neuro:  No headache, numbness or weakness, balance or coordination issues. Endocrine:  Diabetes.  No thyroid issues, hot flashes or night sweats. Psych:  No mood changes, depression or anxiety. Pain:  No focal pain. Review of systems:  All other systems reviewed and found to be negative.  Physical Exam: Blood pressure 136/87, pulse (!) 116, temperature 97.8 F (36.6 C), temperature source Tympanic, resp. rate 18, weight 109 lb 9.1 oz (49.7 kg). GENERAL:  Thin woman sitting comfortably in the exam room in no acute distress. MENTAL STATUS:  Alert and oriented to person, place and time. HEAD:  Long black  hair pulled back.  Normocephalic, atraumatic, face symmetric, no Cushingoid features. EYES:  Brown eyes.  No conjunctivitis or scleral icterus. ABDOMEN:  Soft, non-tender, with active bowel sounds, and no hepatosplenomegaly.  No guarding or rebound tenderness.  No masses. EXTREMITIES: No edema, no skin discoloration or tenderness.  No palpable cords. NEUROLOGICAL: Unremarkable. PSYCH:  Appropriate.   No visits with results within 3 Day(s) from this visit.  Latest known visit with results is:  Hospital Outpatient Visit on 03/26/2016  Component Date Value Ref Range Status  . Glucose-Capillary 03/26/2016 104* 65 - 99 mg/dL Final    Assessment:  Angela Jensen a [age].o. 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   She was not a good surgery candidate secondary to her lung function (FEV1 28%).  Chest CT on 07/31/2011 revealed a 1.1 x 0.6 cm RUL nodule and a 9 mm inferior aspect RUL nodule and a 4 mm nodule in the anterior aspect of the inferior aspect of the RUL.  PET scan on 08/03/2011 revealed a 10-mm spiculated right apical pulmonary nodule (SUV 2.5) suspcious for lung cancer. Left ovary was prominent (3.9 x 2.4 cm).   CA-125 was 9.3 (normal).  CT guided biopsy of the superior and inferior RUL nodules on 12/30/2011 revealed well differentiated adenocarcinoma of lung primary.  EGFR testing revealed no mutation.  She received IMRT 6000 cGy to both nodules from 01/26/2012 - 03/10/2012.  PET scan on 01/30/2015 revealed a hypermetabolic and enlarging RUL pulmonary nodules concerning for lung cancer recurrence.  There was a small focus of peripheral nodular consolidation in the superior segment of the right lower lobe with differential including inflammation/infection versus neoplasm.  There was no evidence of mediastinal nodal metastasis.    PET scan on 03/27/2016 revealed no hypermetabolic activity to suggest recurrent malignancy. There was a soft  tissue density in the left pelvis near the inguinal ring that was not hypermetabolic, but prominent and probably separate from the left ovary. This could be a wandering fibroid.  Although the overall appearance had not changed from 2012 and was not hypermetabolic, pelvic sonography or pelvic MRI for further characterization of the uterus and ovaries can be considered.  In 04/2015, there was an attempt at radiofrequency ablation of 1 enlarging RUL nodule.  At the time of the procedure, the nodule had shrunk to 5 mm from 9 mm.  Chest CT on 08/01/2015 revealed complete resolution of the prior nodule in the right lung apex.  Chest CT on 01/31/2016 revealed no active cardiopulmonary abnormalities.  There was stable appearance of right apical and right middle lobe scar like densities.  She has an elevated CEA of unclear etiology.  CEA was 8.1 on 01/31/2016 and 9.2 on 03/03/2016.  Colonoscopy on 06/25/2015 revealed a 4 mm polyp in the sigmoid colon.  Pathology revealed a tubular adenoma without evidence of dysplasia or malignancy.  Symptomatically, she has right lower quadrant discomfort. Exam is unremarkable.   Plan: 1.  Review PET scan.  No evidence of malignancy.  Discuss plans for follow-up CEA with future appointment and reimaging if symptoms or increase in CEA (> 25%). 2.  Discuss soft tissue density in left pelvis.  Etiology unclear.  Discuss recommendations by radiology (pelvic ultrasound or MRI). 3.  Pelvic MRI:  assess left pelvic soft tissue density and RLQ pain. 4.  Review plan for follow-up imaging (chest CT every 6 months for 2-3 years (complete) then annual low dose screening chest CT). 5.  RTC on 07/31/2016 for MD assessment, labs (CBC with diff, CMP, CEA), and CXR (prior to visit).   Lequita Asal, MD  03/30/2016, 10:06 AM

## 2016-03-30 ENCOUNTER — Encounter: Payer: Self-pay | Admitting: Hematology and Oncology

## 2016-03-30 ENCOUNTER — Other Ambulatory Visit: Payer: Self-pay | Admitting: *Deleted

## 2016-03-30 ENCOUNTER — Inpatient Hospital Stay: Payer: Medicaid Other | Attending: Hematology and Oncology | Admitting: Hematology and Oncology

## 2016-03-30 VITALS — BP 136/87 | HR 116 | Temp 97.8°F | Resp 18 | Wt 109.6 lb

## 2016-03-30 DIAGNOSIS — E119 Type 2 diabetes mellitus without complications: Secondary | ICD-10-CM

## 2016-03-30 DIAGNOSIS — Z9981 Dependence on supplemental oxygen: Secondary | ICD-10-CM | POA: Diagnosis not present

## 2016-03-30 DIAGNOSIS — Z801 Family history of malignant neoplasm of trachea, bronchus and lung: Secondary | ICD-10-CM | POA: Diagnosis not present

## 2016-03-30 DIAGNOSIS — R970 Elevated carcinoembryonic antigen [CEA]: Secondary | ICD-10-CM

## 2016-03-30 DIAGNOSIS — C3411 Malignant neoplasm of upper lobe, right bronchus or lung: Secondary | ICD-10-CM

## 2016-03-30 DIAGNOSIS — Z87891 Personal history of nicotine dependence: Secondary | ICD-10-CM

## 2016-03-30 DIAGNOSIS — R1031 Right lower quadrant pain: Secondary | ICD-10-CM

## 2016-03-30 DIAGNOSIS — I10 Essential (primary) hypertension: Secondary | ICD-10-CM | POA: Diagnosis not present

## 2016-03-30 DIAGNOSIS — Z79899 Other long term (current) drug therapy: Secondary | ICD-10-CM | POA: Insufficient documentation

## 2016-03-30 DIAGNOSIS — F329 Major depressive disorder, single episode, unspecified: Secondary | ICD-10-CM | POA: Diagnosis not present

## 2016-03-30 DIAGNOSIS — J449 Chronic obstructive pulmonary disease, unspecified: Secondary | ICD-10-CM | POA: Diagnosis not present

## 2016-03-30 DIAGNOSIS — R0602 Shortness of breath: Secondary | ICD-10-CM | POA: Diagnosis not present

## 2016-03-30 DIAGNOSIS — R1909 Other intra-abdominal and pelvic swelling, mass and lump: Secondary | ICD-10-CM | POA: Insufficient documentation

## 2016-03-30 DIAGNOSIS — Z85118 Personal history of other malignant neoplasm of bronchus and lung: Secondary | ICD-10-CM | POA: Insufficient documentation

## 2016-03-30 DIAGNOSIS — J45909 Unspecified asthma, uncomplicated: Secondary | ICD-10-CM | POA: Insufficient documentation

## 2016-03-30 DIAGNOSIS — E7800 Pure hypercholesterolemia, unspecified: Secondary | ICD-10-CM | POA: Diagnosis not present

## 2016-03-30 DIAGNOSIS — Z86010 Personal history of colonic polyps: Secondary | ICD-10-CM | POA: Insufficient documentation

## 2016-03-30 DIAGNOSIS — R1900 Intra-abdominal and pelvic swelling, mass and lump, unspecified site: Secondary | ICD-10-CM

## 2016-03-30 DIAGNOSIS — M129 Arthropathy, unspecified: Secondary | ICD-10-CM | POA: Diagnosis not present

## 2016-03-30 NOTE — Progress Notes (Signed)
Patient is here for follow up,no complaints today

## 2016-04-08 ENCOUNTER — Ambulatory Visit: Payer: Medicaid Other

## 2016-04-09 ENCOUNTER — Other Ambulatory Visit: Payer: Self-pay | Admitting: *Deleted

## 2016-04-09 DIAGNOSIS — R948 Abnormal results of function studies of other organs and systems: Secondary | ICD-10-CM

## 2016-04-17 ENCOUNTER — Ambulatory Visit: Payer: Medicaid Other

## 2016-04-21 ENCOUNTER — Other Ambulatory Visit: Payer: Self-pay | Admitting: Hematology and Oncology

## 2016-04-21 ENCOUNTER — Ambulatory Visit
Admission: RE | Admit: 2016-04-21 | Discharge: 2016-04-21 | Disposition: A | Discharge status: Home / Self Care | Payer: Medicaid Other | Source: Ambulatory Visit | Attending: Hematology and Oncology | Admitting: Hematology and Oncology

## 2016-04-21 DIAGNOSIS — R948 Abnormal results of function studies of other organs and systems: Secondary | ICD-10-CM | POA: Insufficient documentation

## 2016-04-21 DIAGNOSIS — R140 Abdominal distension (gaseous): Secondary | ICD-10-CM | POA: Insufficient documentation

## 2016-04-30 ENCOUNTER — Ambulatory Visit: Admission: RE | Admit: 2016-04-30 | Payer: Medicaid Other | Source: Ambulatory Visit

## 2016-04-30 ENCOUNTER — Ambulatory Visit
Admission: RE | Admit: 2016-04-30 | Discharge: 2016-04-30 | Disposition: A | Discharge status: Home / Self Care | Payer: Medicaid Other | Source: Ambulatory Visit | Attending: Hematology and Oncology | Admitting: Hematology and Oncology

## 2016-04-30 DIAGNOSIS — D259 Leiomyoma of uterus, unspecified: Secondary | ICD-10-CM | POA: Insufficient documentation

## 2016-04-30 DIAGNOSIS — C3411 Malignant neoplasm of upper lobe, right bronchus or lung: Secondary | ICD-10-CM

## 2016-04-30 DIAGNOSIS — D251 Intramural leiomyoma of uterus: Secondary | ICD-10-CM | POA: Insufficient documentation

## 2016-04-30 DIAGNOSIS — R1031 Right lower quadrant pain: Secondary | ICD-10-CM

## 2016-04-30 DIAGNOSIS — N838 Other noninflammatory disorders of ovary, fallopian tube and broad ligament: Secondary | ICD-10-CM | POA: Insufficient documentation

## 2016-04-30 LAB — POCT I-STAT CREATININE: Creatinine, Ser: 0.8 mg/dL (ref 0.44–1.00)

## 2016-04-30 MED ORDER — GADOBENATE DIMEGLUMINE 529 MG/ML IV SOLN
10.0000 mL | Freq: Once | INTRAVENOUS | Status: AC | PRN
Start: 2016-04-30 — End: 2016-04-30
  Administered 2016-04-30: 9 mL via INTRAVENOUS

## 2016-05-26 ENCOUNTER — Other Ambulatory Visit (INDEPENDENT_AMBULATORY_CARE_PROVIDER_SITE_OTHER): Payer: Self-pay | Admitting: Vascular Surgery

## 2016-05-26 ENCOUNTER — Encounter (INDEPENDENT_AMBULATORY_CARE_PROVIDER_SITE_OTHER): Payer: Self-pay

## 2016-05-26 ENCOUNTER — Ambulatory Visit (INDEPENDENT_AMBULATORY_CARE_PROVIDER_SITE_OTHER): Payer: Medicaid Other | Admitting: Vascular Surgery

## 2016-05-26 ENCOUNTER — Encounter (INDEPENDENT_AMBULATORY_CARE_PROVIDER_SITE_OTHER): Payer: Self-pay | Admitting: Vascular Surgery

## 2016-05-26 VITALS — BP 113/82 | HR 96 | Resp 16 | Ht 59.0 in | Wt 111.0 lb

## 2016-05-26 DIAGNOSIS — I70213 Atherosclerosis of native arteries of extremities with intermittent claudication, bilateral legs: Secondary | ICD-10-CM

## 2016-05-26 DIAGNOSIS — E785 Hyperlipidemia, unspecified: Secondary | ICD-10-CM | POA: Insufficient documentation

## 2016-05-26 DIAGNOSIS — E118 Type 2 diabetes mellitus with unspecified complications: Secondary | ICD-10-CM

## 2016-05-26 DIAGNOSIS — C3491 Malignant neoplasm of unspecified part of right bronchus or lung: Secondary | ICD-10-CM

## 2016-05-26 DIAGNOSIS — E119 Type 2 diabetes mellitus without complications: Secondary | ICD-10-CM | POA: Insufficient documentation

## 2016-05-26 DIAGNOSIS — I10 Essential (primary) hypertension: Secondary | ICD-10-CM | POA: Diagnosis not present

## 2016-05-26 DIAGNOSIS — I70219 Atherosclerosis of native arteries of extremities with intermittent claudication, unspecified extremity: Secondary | ICD-10-CM

## 2016-05-26 DIAGNOSIS — E1169 Type 2 diabetes mellitus with other specified complication: Secondary | ICD-10-CM | POA: Insufficient documentation

## 2016-05-26 DIAGNOSIS — C3411 Malignant neoplasm of upper lobe, right bronchus or lung: Secondary | ICD-10-CM

## 2016-05-26 DIAGNOSIS — E782 Mixed hyperlipidemia: Secondary | ICD-10-CM | POA: Insufficient documentation

## 2016-05-26 HISTORY — DX: Atherosclerosis of native arteries of extremities with intermittent claudication, unspecified extremity: I70.219

## 2016-05-26 HISTORY — DX: Essential (primary) hypertension: I10

## 2016-05-26 NOTE — Assessment & Plan Note (Signed)
lipid control important in reducing the progression of atherosclerotic disease. Continue statin therapy  

## 2016-05-26 NOTE — Assessment & Plan Note (Signed)
The patient describes lifestyle limiting claudication symptoms of both lower extremities which are significant and not improved with smoking cessation and trying to walk more. She reports these having been going on now for several years. She reports no antecedent history of treatment or procedures for lower extremity arterial disease. She has noninvasive studies from her primary care physician's office which demonstrate monophasic flow throughout much of both lower extremities worse on the left than the right. The patient reports symptoms that are severe and lifestyle limiting. Her left leg is the worse of the 2 legs. She reports this is not getting any better over many months. She desires to have intervention in hopes of improving her circulation. She is already on appropriate medical therapy with aspirin and a statin agent. This should continue. Angiogram with possible revascularization were discussed and she is agreeable to proceed. We will evaluate the left lower extremity first as this is the more severely affected leg, and then potentially perform right lower extremity revascularization in the future.

## 2016-05-26 NOTE — Patient Instructions (Signed)
Peripheral Vascular Disease Peripheral vascular disease (PVD) is a disease of the blood vessels that are not part of your heart and brain. A simple term for PVD is poor circulation. In most cases, PVD narrows the blood vessels that carry blood from your heart to the rest of your body. This can result in a decreased supply of blood to your arms, legs, and internal organs, like your stomach or kidneys. However, it most often affects a person's lower legs and feet. There are two types of PVD.  Organic PVD. This is the more common type. It is caused by damage to the structure of blood vessels.  Functional PVD. This is caused by conditions that make blood vessels contract and tighten (spasm). Without treatment, PVD tends to get worse over time. PVD can also lead to acute ischemic limb. This is when an arm or limb suddenly has trouble getting enough blood. This is a medical emergency. CAUSES Each type of PVD has many different causes. The most common cause of PVD is buildup of a fatty material (plaque) inside of your arteries (atherosclerosis). Small amounts of plaque can break off from the walls of the blood vessels and become lodged in a smaller artery. This blocks blood flow and can cause acute ischemic limb. Other common causes of PVD include:  Blood clots that form inside of blood vessels.  Injuries to blood vessels.  Diseases that cause inflammation of blood vessels or cause blood vessel spasms.  Health behaviors and health history that increase your risk of developing PVD. RISK FACTORS  You may have a greater risk of PVD if you:  Have a family history of PVD.  Have certain medical conditions, including:  High cholesterol.  Diabetes.  High blood pressure (hypertension).  Coronary heart disease.  Past problems with blood clots.  Past injury, such as burns or a broken bone. These may have damaged blood vessels in your limbs.  Buerger disease. This is caused by inflamed blood  vessels in your hands and feet.  Some forms of arthritis.  Rare birth defects that affect the arteries in your legs.  Use tobacco.  Do not get enough exercise.  Are obese.  Are age 50 or older. SIGNS AND SYMPTOMS  PVD may cause many different symptoms. Your symptoms depend on what part of your body is not getting enough blood. Some common signs and symptoms include:  Cramps in your lower legs. This may be a symptom of poor leg circulation (claudication).  Pain and weakness in your legs while you are physically active that goes away when you rest (intermittent claudication).  Leg pain when at rest.  Leg numbness, tingling, or weakness.  Coldness in a leg or foot, especially when compared with the other leg.  Skin or hair changes. These can include:  Hair loss.  Shiny skin.  Pale or bluish skin.  Thick toenails.  Inability to get or maintain an erection (erectile dysfunction). People with PVD are more prone to developing ulcers and sores on their toes, feet, or legs. These may take longer than normal to heal. DIAGNOSIS Your health care provider may diagnose PVD from your signs and symptoms. The health care provider will also do a physical exam. You may have tests to find out what is causing your PVD and determine its severity. Tests may include:  Blood pressure recordings from your arms and legs and measurements of the strength of your pulses (pulse volume recordings).  Imaging studies using sound waves to take pictures of   the blood flow through your blood vessels (Doppler ultrasound).  Injecting a dye into your blood vessels before having imaging studies using:  X-rays (angiogram or arteriogram).  Computer-generated X-rays (CT angiogram).  A powerful electromagnetic field and a computer (magnetic resonance angiogram or MRA). TREATMENT Treatment for PVD depends on the cause of your condition and the severity of your symptoms. It also depends on your age. Underlying  causes need to be treated and controlled. These include long-lasting (chronic) conditions, such as diabetes, high cholesterol, and high blood pressure. You may need to first try making lifestyle changes and taking medicines. Surgery may be needed if these do not work. Lifestyle changes may include:  Quitting smoking.  Exercising regularly.  Following a low-fat, low-cholesterol diet. Medicines may include:  Blood thinners to prevent blood clots.  Medicines to improve blood flow.  Medicines to improve your blood cholesterol levels. Surgical procedures may include:  A procedure that uses an inflated balloon to open a blocked artery and improve blood flow (angioplasty).  A procedure to put in a tube (stent) to keep a blocked artery open (stent implant).  Surgery to reroute blood flow around a blocked artery (peripheral bypass surgery).  Surgery to remove dead tissue from an infected wound on the affected limb.  Amputation. This is surgical removal of the affected limb. This may be necessary in cases of acute ischemic limb that are not improved through medical or surgical treatments. HOME CARE INSTRUCTIONS  Take medicines only as directed by your health care provider.  Do not use any tobacco products, including cigarettes, chewing tobacco, or electronic cigarettes. If you need help quitting, ask your health care provider.  Lose weight if you are overweight, and maintain a healthy weight as directed by your health care provider.  Eat a diet that is low in fat and cholesterol. If you need help, ask your health care provider.  Exercise regularly. Ask your health care provider to suggest some good activities for you.  Use compression stockings or other mechanical devices as directed by your health care provider.  Take good care of your feet.  Wear comfortable shoes that fit well.  Check your feet often for any cuts or sores. SEEK MEDICAL CARE IF:  You have cramps in your legs  while walking.  You have leg pain when you are at rest.  You have coldness in a leg or foot.  Your skin changes.  You have erectile dysfunction.  You have cuts or sores on your feet that are not healing. SEEK IMMEDIATE MEDICAL CARE IF:  Your arm or leg turns cold and blue.  Your arms or legs become red, warm, swollen, painful, or numb.  You have chest pain or trouble breathing.  You suddenly have weakness in your face, arm, or leg.  You become very confused or lose the ability to speak.  You suddenly have a very bad headache or lose your vision.   This information is not intended to replace advice given to you by your health care provider. Make sure you discuss any questions you have with your health care provider.   Document Released: 09/10/2004 Document Revised: 08/24/2014 Document Reviewed: 01/11/2014 [employer] Interactive Patient Education 2016 [employer]  

## 2016-05-26 NOTE — Progress Notes (Signed)
[name], female   DOB: 02/14/1956, 60 y.o.   MRN: [id number]  Chief Complaint  Patient presents with  . PAD    NP, for possible PAD  . New Evaluation    HPI Angela Jensen is a 60 y.o. female.  I am asked to see the patient by Dr. Clayborn Bigness for evaluation of peripheral arterial disease.  The patient reports pain and cramping in her legs which worsens with activities. She was started on Requip a couple of years ago for restless leg syndrome which helped her nighttime symptoms, she continues to have pain in her legs with only short distances of walking. She reports only being able to walk about 20-25 feet before her legs or cramping so bad she has to stop. The left lower extremity is more severely affected leg, but she notices pain in the right leg as well. She does not have ulceration or infection. She denies previous vascular surgery to the lower extremities. She has no fever or chills. The pain is described as an aching and heaviness in the leg and she says her legs make her whole body feel bad. She has much less energy and more fatigued than she did a year or 2 ago. She has noninvasive studies from her primary care physician's office which demonstrate monophasic flow throughout much of both lower extremities worse on the left than the right.   Past Medical History:  Diagnosis Date  . Arthritis   . Asthma   . Cancer [facility]) 2012   Right Lung CA  . COPD (chronic obstructive pulmonary disease) ([place])   . Depression   . Diabetes mellitus without complication [facility])    Patient takes Janumet  . Hypercholesteremia   . Hypertension   . 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 Shortness of breath dyspnea    with exertion     Past Surgical History:  Procedure Laterality Date  . CESAREAN SECTION     x3  . COLONOSCOPY WITH PROPOFOL N/A 06/25/2015   Procedure: COLONOSCOPY WITH PROPOFOL;  Surgeon: Lucilla Lame, MD;  Location: [facility] ENDOSCOPY;  Service: Endoscopy;  Laterality: N/A;  .  CYST REMOVAL LEG     and on shoulder   . LUNG BIOPSY  05 15 2013   has lung "spots"    Family History  Problem Relation Age of Onset  . Diabetes Mother   . Lung cancer Father   . Diabetes Sister   . Diabetes Maternal Grandmother   . Diabetes Paternal Grandmother   . Diabetes Sister      Social History Social History  Substance Use Topics  . Smoking status: Former Smoker    Packs/day: 1.00    Years: 30.00    Types: Cigarettes    Quit date: 02/06/2010  . Smokeless tobacco: Former Systems developer    Types: Snuff  . Alcohol use No     Comment: hx of etoh use    No IV drug use  No Known Allergies  Current Outpatient Prescriptions  Medication Sig Dispense Refill  . albuterol (PROAIR HFA) 108 (90 BASE) MCG/ACT inhaler Inhale 2 puffs into the lungs every 4 (four) hours as needed for wheezing or shortness of breath.     Marland Kitchen albuterol (PROVENTIL) (2.5 MG/3ML) 0.083% nebulizer solution Take 2.5 mg by nebulization every 6 (six) hours as needed for wheezing.     Marland Kitchen amLODipine (NORVASC) 10 MG tablet Take 10 mg by mouth every morning.     Marland Kitchen  buPROPion (WELLBUTRIN) 75 MG tablet Take 75 mg by mouth 2 (two) times daily.    Marland Kitchen gabapentin (NEURONTIN) 100 MG capsule Take 100 mg by mouth 2 (two) times daily.    Marland Kitchen lisinopril-hydrochlorothiazide (PRINZIDE,ZESTORETIC) 10-12.5 MG per tablet Take 1 tablet by mouth every morning.    . meloxicam (MOBIC) 7.5 MG tablet Take 7.5 mg by mouth 2 (two) times daily.    . mometasone-formoterol (DULERA) 200-5 MCG/ACT AERO Inhale 1 puff into the lungs 2 (two) times daily.    . potassium chloride (K-DUR) 10 MEQ tablet Take 10 mEq by mouth every other day. Instructed to take 33mq daily from 9/6 - 9/11 then resume taking 168m every other day.    . Marland KitchenOPINIRole (REQUIP) 0.5 MG tablet Take 0.5 mg by mouth 2 (two) times daily.     . rosuvastatin (CRESTOR) 10 MG tablet Take 10 mg by mouth at bedtime.     . SitaGLIPtin-MetFORMIN HCl (JANUMET XR) 50-500 MG TB24 Take 1 tablet by mouth  daily with breakfast.     . tiotropium (SPIRIVA HANDIHALER) 18 MCG inhalation capsule Place 18 mcg into inhaler and inhale daily.     No current facility-administered medications for this visit.       REVIEW OF SYSTEMS (Negative unless checked)  Constitutional: _0 Weight loss  _1 Fever  _2 Chills Cardiac: _3 Chest pain   _4 Chest pressure   _5 Palpitations   _6 Shortness of breath when laying flat   _7 Shortness of breath at rest   _8 Shortness of breath with exertion. Vascular:  _9 Pain in legs with walking   _10 Pain in legs at rest   _11 Pain in legs when laying flat   _12 Claudication   _13 Pain in feet when walking  _14 Pain in feet at rest  _15 Pain in feet when laying flat   _16 History of DVT   _17 Phlebitis   _18 Swelling in legs   _19 Varicose veins   _20 Non-healing ulcers Pulmonary:   _21 Uses home oxygen   _22 Productive cough   _23 Hemoptysis   _24 Wheeze  _25 COPD   _26 Asthma Neurologic:  _27 Dizziness  _28 Blackouts   _29 Seizures   _30 History of stroke   _31 History of TIA  _32 Aphasia   _33 Temporary blindness   _34 Dysphagia   _35 Weakness or numbness in arms   _36 Weakness or numbness in legs Musculoskeletal:  _37 Arthritis   _38 Joint swelling   _39 Joint pain   _40 Low back pain Hematologic:  _41 Easy bruising  _42 Easy bleeding   _43 Hypercoagulable state   _44 Anemic  _45 Hepatitis Gastrointestinal:  _46 Blood in stool   _47 Vomiting blood  _48 Gastroesophageal reflux/heartburn   _49 Abdominal pain Genitourinary:  _50 Chronic kidney disease   _51 Difficult urination  _52 Frequent urination  _53 Burning with urination   _54 Hematuria Skin:  _55 Rashes   _56 Ulcers   _57 Wounds Psychological:  _58 History of anxiety   _59  History of major depression.    Physical Exam BP 113/82 (BP Location: Right Arm)   Pulse 96   Resp 16   Ht _60  (1.499 m)   Wt 111 lb (50.3 kg)   BMI 22.42 kg/m  Gen:  WD/WN, NAD Head: [place]/AT, No temporalis wasting. Prominent temp pulse not noted. Ear/Nose/Throat: Hearing grossly intact, nares w/o erythema or drainage, oropharynx w/o  Erythema/Exudate Eyes: PERRLA, EOMI.  Neck: Supple, no nuchal rigidity.  No bruit or JVD.  Pulmonary:  Good air movement, clear to auscultation bilaterally.  Cardiac: RRR, normal S1, S2, no Murmurs, rubs or gallops. Vascular:  Vessel Right Left  Radial Palpable Palpable  Ulnar Palpable Palpable  Brachial Palpable Palpable  Carotid Palpable, without bruit Palpable, without  bruit  Aorta Not palpable N/A  Femoral Palpable Palpable  Popliteal Palpable Palpable  PT Palpable Trace Palpable  DP 1+ Palpable Palpable   Gastrointestinal: soft, non-tender/non-distended. No guarding/reflex. No masses, surgical incisions, or scars. Musculoskeletal: M/S 5/5 throughout.  Extremities without ischemic changes.  No deformity or atrophy. No appreciable lower extremity edema. Neurologic: CN 2-12 intact. Pain and light touch intact in extremities.  Symmetrical.  Speech is fluent. Motor exam as listed above. Psychiatric: Judgment intact, Mood & affect appropriate for pt's clinical situation. Dermatologic: No rashes or ulcers noted.  No cellulitis or open wounds. Lymph : No Cervical, Axillary, or Inguinal lymphadenopathy.   Radiology Mr Pelvis W Wo Contrast  Result Date: 04/30/2016 CLINICAL DATA:  Indeterminate stable LEFT enhancing lesion. Lesions stable over multiple comparison exam and negative on FDG PET imaging. EXAM: MRI PELVIS WITHOUT AND WITH CONTRAST TECHNIQUE: Multiplanar multisequence MR imaging of the pelvis was performed both before and after administration of intravenous contrast. CONTRAST:  95m MULTIHANCE GADOBENATE DIMEGLUMINE 529 MG/ML IV SOLN COMPARISON:  Head CT 08/03/2011, 850 1,017 FINDINGS: Uterus: Measures normal in size at 6.2 by a 5.4 by 4.2 cm. Endometrium is thin at 3 mm. There several round lesions within the wall of the uterus with low signal intensity on T2 weighted imaging consistent leiomyoma. Lesion in the LEFT adnexa measuring 3.4 by 2.5 cm in axial dimension (image 8, series  3) has low signal intensity similar to the intramural uterine leiomyomas. Lesion has enhancement pattern similar to the intramural leiomyomas. Endometrium:  Normal Cervix/Vagina:  Normal Right ovary:  Not identified Left ovary:  Not identified Urinary Tract:  Normal bladder Bowel:  Unremarkable Vascular/Lymphatic: No pathologically enlarged lymph nodes. No significant vascular abnormality seen. Other:  None. Musculoskeletal:  Unremarkable. IMPRESSION: 1. Ovoid mass in the LEFT adnexa has signal characteristics identical to the intramural leiomyomas and consistent with an exophytic leiomyoma. 2. Multiple uterine leiomyomas as above. 3. Endometrium is normal.  Normal junctional zone. 4. Ovaries not identified. Electronically Signed   By: SSuzy BouchardM.D.   On: 04/30/2016 17:18    Labs Recent Results (from the past 2160 hour(s))  CBC with Differential     Status: None   Collection Time: 03/03/16 10:15 AM  Result Value Ref Range   WBC 4.7 3.6 - 11.0 K/uL   RBC 4.88 3.80 - 5.20 MIL/uL   Hemoglobin 14.6 12.0 - 16.0 g/dL   HCT 43.1 35.0 - 47.0 %   MCV 88.3 80.0 - 100.0 fL   MCH 29.9 26.0 - 34.0 pg   MCHC 33.8 32.0 - 36.0 g/dL   RDW 13.4 11.5 - 14.5 %   Platelets 244 150 - 440 K/uL   Neutrophils Relative % 67 %   Neutro Abs 3.1 1.4 - 6.5 K/uL   Lymphocytes Relative 24 %   Lymphs Abs 1.1 1.0 - 3.6 K/uL   Monocytes Relative 7 %   Monocytes Absolute 0.3 0.2 - 0.9 K/uL   Eosinophils Relative 1 %   Eosinophils Absolute 0.1 0 - 0.7 K/uL   Basophils Relative 1 %   Basophils Absolute 0.0 0 - 0.1 K/uL  Comprehensive metabolic panel     Status: Abnormal   Collection Time: 03/03/16 10:15 AM  Result Value Ref Range   Sodium 138 135 - 145 mmol/L   Potassium 3.6 3.5 - 5.1 mmol/L   Chloride 101 101 - 111 mmol/L   CO2 31 22 - 32 mmol/L   Glucose, Bld 106 (H) 65 - 99 mg/dL  BUN 22 (H) 6 - 20 mg/dL   Creatinine, Ser 0.82 0.44 - 1.00 mg/dL   Calcium 9.7 8.9 - 10.3 mg/dL   Total Protein 8.8 (H) 6.5 -  8.1 g/dL   Albumin 4.8 3.5 - 5.0 g/dL   AST 25 15 - 41 U/L   ALT 21 14 - 54 U/L   Alkaline Phosphatase 64 38 - 126 U/L   Total Bilirubin 0.5 0.3 - 1.2 mg/dL   GFR calc non Af Amer >60 >60 mL/min   GFR calc Af Amer >60 >60 mL/min    Comment: (NOTE) The eGFR has been calculated using the CKD EPI equation. This calculation has not been validated in all clinical situations. eGFR's persistently <60 mL/min signify possible Chronic Kidney Disease.    Anion gap 6 5 - 15  CEA     Status: Abnormal   Collection Time: 03/03/16 10:15 AM  Result Value Ref Range   CEA 9.2 (H) 0.0 - 4.7 ng/mL    Comment: (NOTE)       Roche ECLIA methodology       Nonsmokers  <3.9                                     Smokers     <5.6 Performed At: [facility] [address] [place], [place] [id number] Lindon Romp MD BS:[id number]   Glucose, capillary     Status: Abnormal   Collection Time: 03/26/16 11:29 AM  Result Value Ref Range   Glucose-Capillary 104 (H) 65 - 99 mg/dL  I-STAT creatinine     Status: None   Collection Time: 04/30/16  1:57 PM  Result Value Ref Range   Creatinine, Ser 0.80 0.44 - 1.00 mg/dL   I have reviewed the notes from her primary care physician including 2 office visits and the noninvasive vascular studies as described above.  Assessment/Plan:  Essential hypertension blood pressure control important in reducing the progression of atherosclerotic disease. On appropriate oral medications.   Diabetes ([place]) blood glucose control important in reducing the progression of atherosclerotic disease. Also, involved in wound healing. On appropriate medications.   Hyperlipidemia lipid control important in reducing the progression of atherosclerotic disease. Continue statin therapy   Atherosclerosis of native arteries of extremity with intermittent claudication ([place]) The patient describes lifestyle limiting claudication symptoms of both lower extremities which are significant  and not improved with smoking cessation and trying to walk more. She reports these having been going on now for several years. She reports no antecedent history of treatment or procedures for lower extremity arterial disease. She has noninvasive studies from her primary care physician's office which demonstrate monophasic flow throughout much of both lower extremities worse on the left than the right. The patient reports symptoms that are severe and lifestyle limiting. Her left leg is the worse of the 2 legs. She reports this is not getting any better over many months. She desires to have intervention in hopes of improving her circulation. She is already on appropriate medical therapy with aspirin and a statin agent. This should continue. Angiogram with possible revascularization were discussed and she is agreeable to proceed. We will evaluate the left lower extremity first as this is the more severely affected leg, and then potentially perform right lower extremity revascularization in the future.      [name] 05/26/2016, 10:14 AM   This note was created with Dragon medical  transcription system.  Any errors from dictation are unintentional.

## 2016-05-26 NOTE — Assessment & Plan Note (Signed)
blood glucose control important in reducing the progression of atherosclerotic disease. Also, involved in wound healing. On appropriate medications.  

## 2016-05-26 NOTE — Assessment & Plan Note (Signed)
blood pressure control important in reducing the progression of atherosclerotic disease. On appropriate oral medications.  

## 2016-05-29 ENCOUNTER — Other Ambulatory Visit (INDEPENDENT_AMBULATORY_CARE_PROVIDER_SITE_OTHER): Payer: Self-pay | Admitting: Vascular Surgery

## 2016-05-29 ENCOUNTER — Other Ambulatory Visit
Admission: RE | Admit: 2016-05-29 | Discharge: 2016-05-29 | Disposition: A | Discharge status: Home / Self Care | Payer: Medicaid Other | Source: Ambulatory Visit | Attending: Vascular Surgery | Admitting: Vascular Surgery

## 2016-06-01 ENCOUNTER — Encounter
Admission: RE | Disposition: A | Discharge status: Home / Self Care | Payer: Self-pay | Source: Ambulatory Visit | Attending: Vascular Surgery

## 2016-06-01 ENCOUNTER — Ambulatory Visit
Admission: RE | Admit: 2016-06-01 | Discharge: 2016-06-01 | Disposition: A | Discharge status: Home / Self Care | Payer: Medicaid Other | Source: Ambulatory Visit | Attending: Vascular Surgery | Admitting: Vascular Surgery

## 2016-06-01 DIAGNOSIS — Z833 Family history of diabetes mellitus: Secondary | ICD-10-CM | POA: Diagnosis not present

## 2016-06-01 DIAGNOSIS — E7800 Pure hypercholesterolemia, unspecified: Secondary | ICD-10-CM | POA: Diagnosis not present

## 2016-06-01 DIAGNOSIS — Z801 Family history of malignant neoplasm of trachea, bronchus and lung: Secondary | ICD-10-CM | POA: Insufficient documentation

## 2016-06-01 DIAGNOSIS — Z9981 Dependence on supplemental oxygen: Secondary | ICD-10-CM | POA: Diagnosis not present

## 2016-06-01 DIAGNOSIS — I70212 Atherosclerosis of native arteries of extremities with intermittent claudication, left leg: Secondary | ICD-10-CM | POA: Diagnosis not present

## 2016-06-01 DIAGNOSIS — Z87891 Personal history of nicotine dependence: Secondary | ICD-10-CM | POA: Diagnosis not present

## 2016-06-01 DIAGNOSIS — J449 Chronic obstructive pulmonary disease, unspecified: Secondary | ICD-10-CM | POA: Insufficient documentation

## 2016-06-01 DIAGNOSIS — Z7984 Long term (current) use of oral hypoglycemic drugs: Secondary | ICD-10-CM | POA: Diagnosis not present

## 2016-06-01 DIAGNOSIS — E119 Type 2 diabetes mellitus without complications: Secondary | ICD-10-CM | POA: Diagnosis not present

## 2016-06-01 DIAGNOSIS — M1990 Unspecified osteoarthritis, unspecified site: Secondary | ICD-10-CM | POA: Insufficient documentation

## 2016-06-01 DIAGNOSIS — I10 Essential (primary) hypertension: Secondary | ICD-10-CM | POA: Diagnosis not present

## 2016-06-01 DIAGNOSIS — I70213 Atherosclerosis of native arteries of extremities with intermittent claudication, bilateral legs: Secondary | ICD-10-CM | POA: Insufficient documentation

## 2016-06-01 DIAGNOSIS — Z85118 Personal history of other malignant neoplasm of bronchus and lung: Secondary | ICD-10-CM | POA: Diagnosis not present

## 2016-06-01 HISTORY — PX: PERIPHERAL VASCULAR CATHETERIZATION: SHX172C

## 2016-06-01 HISTORY — DX: Peripheral vascular disease, unspecified: I73.9

## 2016-06-01 LAB — COMPREHENSIVE METABOLIC PANEL
ALT: 18 U/L (ref 14–54)
ANION GAP: 3 — AB (ref 5–15)
AST: 21 U/L (ref 15–41)
Albumin: 3.9 g/dL (ref 3.5–5.0)
Alkaline Phosphatase: 56 U/L (ref 38–126)
BILIRUBIN TOTAL: 0.2 mg/dL — AB (ref 0.3–1.2)
BUN: 19 mg/dL (ref 6–20)
CHLORIDE: 107 mmol/L (ref 101–111)
CO2: 29 mmol/L (ref 22–32)
Calcium: 8.7 mg/dL — ABNORMAL LOW (ref 8.9–10.3)
Creatinine, Ser: 0.71 mg/dL (ref 0.44–1.00)
Glucose, Bld: 121 mg/dL — ABNORMAL HIGH (ref 65–99)
POTASSIUM: 3.7 mmol/L (ref 3.5–5.1)
Sodium: 139 mmol/L (ref 135–145)
TOTAL PROTEIN: 7 g/dL (ref 6.5–8.1)

## 2016-06-01 LAB — CBC
HEMATOCRIT: 35.5 % (ref 35.0–47.0)
Hemoglobin: 11.9 g/dL — ABNORMAL LOW (ref 12.0–16.0)
MCH: 29.8 pg (ref 26.0–34.0)
MCHC: 33.7 g/dL (ref 32.0–36.0)
MCV: 88.6 fL (ref 80.0–100.0)
PLATELETS: 216 10*3/uL (ref 150–440)
RBC: 4.01 MIL/uL (ref 3.80–5.20)
RDW: 13.7 % (ref 11.5–14.5)
WBC: 4.2 10*3/uL (ref 3.6–11.0)

## 2016-06-01 SURGERY — LOWER EXTREMITY ANGIOGRAPHY
Anesthesia: Moderate Sedation

## 2016-06-01 MED ORDER — SODIUM CHLORIDE 0.9 % IV SOLN: INTRAVENOUS | Status: DC

## 2016-06-01 MED ORDER — DEXTROSE 5 % IV SOLN
1.5000 g | INTRAVENOUS | Status: AC
  Administered 2016-06-01: 1.5 g via INTRAVENOUS

## 2016-06-01 MED ORDER — METHYLPREDNISOLONE SODIUM SUCC 125 MG IJ SOLR: 125.0000 mg | INTRAMUSCULAR | Status: DC | PRN

## 2016-06-01 MED ORDER — OXYCODONE-ACETAMINOPHEN 5-325 MG PO TABS: 1.0000 | ORAL_TABLET | ORAL | Status: DC | PRN

## 2016-06-01 MED ORDER — CEFUROXIME SODIUM 1.5 G IJ SOLR: 1.5000 g | INTRAMUSCULAR | Status: DC

## 2016-06-01 MED ORDER — SODIUM CHLORIDE 0.9 % IV SOLN
INTRAVENOUS | Status: DC
  Administered 2016-06-01: 09:00:00 via INTRAVENOUS

## 2016-06-01 MED ORDER — IOPAMIDOL (ISOVUE-300) INJECTION 61%
INTRAVENOUS | Status: DC | PRN
  Administered 2016-06-01: 80 mL via INTRA_ARTERIAL

## 2016-06-01 MED ORDER — MIDAZOLAM HCL 2 MG/2ML IJ SOLN
INTRAMUSCULAR | Status: DC | PRN
  Administered 2016-06-01 (×4): 0.5 mg via INTRAVENOUS
  Administered 2016-06-01: 2 mg via INTRAVENOUS

## 2016-06-01 MED ORDER — ACETAMINOPHEN 325 MG PO TABS: 325.0000 mg | ORAL_TABLET | ORAL | Status: DC | PRN

## 2016-06-01 MED ORDER — HEPARIN SODIUM (PORCINE) 1000 UNIT/ML IJ SOLN
INTRAMUSCULAR | Status: AC
  Filled 2016-06-01: qty 1

## 2016-06-01 MED ORDER — FAMOTIDINE 20 MG PO TABS: 40.0000 mg | ORAL_TABLET | ORAL | Status: DC | PRN

## 2016-06-01 MED ORDER — ASPIRIN EC 81 MG PO TBEC: 81.0000 mg | DELAYED_RELEASE_TABLET | Freq: Every day | ORAL | 2 refills | Status: DC

## 2016-06-01 MED ORDER — ONDANSETRON HCL 4 MG/2ML IJ SOLN: 4.0000 mg | Freq: Four times a day (QID) | INTRAMUSCULAR | Status: DC | PRN

## 2016-06-01 MED ORDER — FENTANYL CITRATE (PF) 100 MCG/2ML IJ SOLN
INTRAMUSCULAR | Status: AC
  Filled 2016-06-01: qty 4

## 2016-06-01 MED ORDER — HYDROMORPHONE HCL 1 MG/ML IJ SOLN: 1.0000 mg | Freq: Once | INTRAMUSCULAR | Status: DC

## 2016-06-01 MED ORDER — ACETAMINOPHEN 325 MG RE SUPP
325.0000 mg | RECTAL | Status: DC | PRN
  Filled 2016-06-01: qty 2

## 2016-06-01 MED ORDER — PHENOL 1.4 % MT LIQD: 1.0000 | OROMUCOSAL | Status: DC | PRN

## 2016-06-01 MED ORDER — HYDRALAZINE HCL 20 MG/ML IJ SOLN: 5.0000 mg | INTRAMUSCULAR | Status: DC | PRN

## 2016-06-01 MED ORDER — FENTANYL CITRATE (PF) 100 MCG/2ML IJ SOLN
INTRAMUSCULAR | Status: DC | PRN
  Administered 2016-06-01: 12.5 ug via INTRAVENOUS
  Administered 2016-06-01: 50 ug via INTRAVENOUS
  Administered 2016-06-01 (×3): 12.5 ug via INTRAVENOUS

## 2016-06-01 MED ORDER — METOPROLOL TARTRATE 5 MG/5ML IV SOLN: 2.0000 mg | INTRAVENOUS | Status: DC | PRN

## 2016-06-01 MED ORDER — HYDROMORPHONE HCL 1 MG/ML IJ SOLN: 0.5000 mg | INTRAMUSCULAR | Status: DC | PRN

## 2016-06-01 MED ORDER — GUAIFENESIN-DM 100-10 MG/5ML PO SYRP: 15.0000 mL | ORAL_SOLUTION | ORAL | Status: DC | PRN

## 2016-06-01 MED ORDER — SODIUM CHLORIDE 0.9 % IV SOLN: 500.0000 mL | Freq: Once | INTRAVENOUS | Status: DC | PRN

## 2016-06-01 MED ORDER — MIDAZOLAM HCL 5 MG/5ML IJ SOLN
INTRAMUSCULAR | Status: AC
  Filled 2016-06-01: qty 5

## 2016-06-01 MED ORDER — LABETALOL HCL 5 MG/ML IV SOLN: 10.0000 mg | INTRAVENOUS | Status: DC | PRN

## 2016-06-01 MED ORDER — LIDOCAINE-EPINEPHRINE (PF) 1 %-1:200000 IJ SOLN
INTRAMUSCULAR | Status: AC
  Filled 2016-06-01: qty 30

## 2016-06-01 MED ORDER — CLOPIDOGREL BISULFATE 75 MG PO TABS
75.0000 mg | ORAL_TABLET | Freq: Every day | ORAL | 11 refills | Status: DC
Start: 2016-06-01 — End: 2017-05-17

## 2016-06-01 MED ORDER — HEPARIN SODIUM (PORCINE) 1000 UNIT/ML IJ SOLN
INTRAMUSCULAR | Status: DC | PRN
  Administered 2016-06-01: 5000 [IU] via INTRAVENOUS

## 2016-06-01 SURGICAL SUPPLY — 17 items
BALLN LUTONIX 5X150X130 (BALLOONS) ×8
BALLN ULTRVRSE 5X10XX130C (BALLOONS) ×4
BALLOON LUTONIX 5X150X130 (BALLOONS) ×6 IMPLANT
BALLOON ULTRVRSE 5X10XX130C (BALLOONS) ×3 IMPLANT
CATH PIG 70CM (CATHETERS) ×4 IMPLANT
CATH VERT 100CM (CATHETERS) ×4 IMPLANT
DEVICE PRESTO INFLATION (MISCELLANEOUS) ×4 IMPLANT
DEVICE STARCLOSE SE CLOSURE (Vascular Products) ×4 IMPLANT
GLIDEWIRE ADV .035X260CM (WIRE) ×4 IMPLANT
LIFESTENT 6X80X130 (Permanent Stent) ×4 IMPLANT
LIFESTENT SOLO 6X200X135 (Permanent Stent) ×4 IMPLANT
PACK ANGIOGRAPHY (CUSTOM PROCEDURE TRAY) ×4 IMPLANT
SHEATH ANL2 6FRX45 HC (SHEATH) ×4 IMPLANT
SHEATH BRITE TIP 5FRX11 (SHEATH) ×4 IMPLANT
SYR MEDRAD MARK V 150ML (SYRINGE) ×4 IMPLANT
TUBING CONTRAST HIGH PRESS 72 (TUBING) ×4 IMPLANT
WIRE J 3MM .035X145CM (WIRE) ×4 IMPLANT

## 2016-06-01 NOTE — H&P (Signed)
[facility] History & Physical Update  The patient was interviewed and re-examined.  The patient's previous History and Physical has been reviewed and is unchanged.  There is no change in the plan of care. We plan to proceed with the scheduled procedure.  [name], MD  06/01/2016, 9:20 AM

## 2016-06-01 NOTE — Op Note (Signed)
[facility] Percutaneous Study/Intervention Procedural Note   Date of Surgery: 06/01/2016  Surgeon(s):Laren Whaling   Assistants:none  Pre-operative Diagnosis: PAD with claudication BLE  Post-operative diagnosis: Same  Procedure(s) Performed: 1. Ultrasound guidance for vascular access right femoral artery 2. Catheter placement into left popliteal artery from right femoral approach 3. Aortogram and selective left lower extremity angiogram 4. Percutaneous transluminal angioplasty to the left SFA and above-knee popliteal artery with 5 mm diameter angioplasty balloon including drug-coated angioplasty balloons proximally and distally  5.  Stent placement 2 to the SFA for greater than 50% residual stenosis and dissection after angioplasty using 6 mm Lifestents 7. StarClose closure device right femoral artery  EBL: minimal  Contrast: 80 cc  Fluoro Time: 7.8 minutes  Moderate Conscious Sedation Time: approximately 40 minutes using 4 mg of Versed and 100 mcg of Fentanyl  Indications: Patient is a 60 y.o.female with short distance claudication in both lower extremities worse on the left than the right. The patient has noninvasive study showing monophasic flow distally in both lower extremities with reduced ABIs. The patient is brought in for angiography for further evaluation and potential treatment. Risks and benefits are discussed and informed consent is obtained  Procedure: The patient was identified and appropriate procedural time out was performed. The patient was then placed supine on the table and prepped and draped in the usual sterile fashion.Moderate conscious sedation was administered during a face to face encounter with the patient throughout the procedure with my supervision of the RN administering medicines and monitoring the patient's vital signs, pulse oximetry,  telemetry and mental status throughout from the start of the procedure until the patient was taken to the recovery room. Ultrasound was used to evaluate the right common femoral artery. It was patent . A digital ultrasound image was acquired. A Seldinger needle was used to access the right common femoral artery under direct ultrasound guidance and a permanent image was performed. A 0.035 J wire was advanced without resistance and a 5Fr sheath was placed. Pigtail catheter was placed into the aorta and an AP aortogram was performed. This demonstrated Mildly ectatic aorta but does not appear frankly aneurysmal. Renal arteries appear to have mild stenosis. Iliac arteries appear to have mild stenosis but nothing greater than 30 or 40%. I then crossed the aortic bifurcation and advanced to the left femoral head. Selective left lower extremity angiogram was then performed. This demonstrated a near flush occlusion of the left SFA with reconstitution of the popliteal artery at [place]'s canal distally. The runoff was sluggish, but there appeared to be two-vessel runoff distally. The patient was systemically heparinized and a 6 [place] Ansell sheath was then placed over the [employer] wire. I then used a Kumpe catheter and the advantage wire to navigate through the SFA occlusion and confirm intraluminal flow in the popliteal artery below the knee. I replaced the wire. Angioplasty was then performed to the above-knee popliteal artery and the entire SFA. 5 mm x 15 cm Lutonix balloons were used proximally and distally with an additional inflation of the first balloon in the mid segment.  Each inflation was inflated to 8 to 10 atm for 1 minute. Following angioplasty, there was a spiral dissection with multiple areas of greater than 50% stenosis and the flow was not ideal. I elected to treat this with stent placement. The length required 2 stents from [place]'s canal up to near the origin of the SFA. 6 mm diameter by 20 cm  length stent  for the initial stent and then a 6 mm diameter by 8 cm length stent was taken about 1-2 cm from the origin of the SFA. This was postdilated with a 5 mm balloon with no greater than 20% residual stenosis identified. I elected to terminate the procedure. The sheath was removed and StarClose closure device was deployed in the right femoral artery with excellent hemostatic result. The patient was taken to the recovery room in stable condition having tolerated the procedure well.  Findings:  Aortogram: Mildly ectatic aorta but does not appear frankly aneurysmal. Renal arteries appear to have mild stenosis. Iliac arteries appear to have mild stenosis but nothing greater than 30 or 40%. Right Lower Extremity: This demonstrated a near flush occlusion of the left SFA with reconstitution of the popliteal artery at [place]'s canal distally. The runoff was sluggish, but there appeared to be two-vessel runoff distally.   Disposition: Patient was taken to the recovery room in stable condition having tolerated the procedure well.  Complications: None  Angela Jensen 06/01/2016 11:05 AM   This note was created with Dragon Medical transcription system. Any errors in dictation are purely unintentional.

## 2016-06-01 NOTE — Discharge Instructions (Signed)
Angiogram, Care After °Refer to this sheet in the next few weeks. These instructions provide you with information about caring for yourself after your procedure. Your health care provider may also give you more specific instructions. Your treatment has been planned according to current medical practices, but problems sometimes occur. Call your health care provider if you have any problems or questions after your procedure. °WHAT TO EXPECT AFTER THE PROCEDURE °After your procedure, it is typical to have the following: °· Bruising at the catheter insertion site that usually fades within 1-2 weeks. °· Blood collecting in the tissue (hematoma) that may be painful to the touch. It should usually decrease in size and tenderness within 1-2 weeks. °HOME CARE INSTRUCTIONS °· Take medicines only as directed by your health care provider. °· You may shower 24-48 hours after the procedure or as directed by your health care provider. Remove the bandage (dressing) and gently wash the site with plain soap and water. Pat the area dry with a clean towel. Do not rub the site, because this may cause bleeding. °· Do not take baths, swim, or use a hot tub until your health care provider approves. °· Check your insertion site every day for redness, swelling, or drainage. °· Do not apply powder or lotion to the site. °· Do not lift over 10 lb (4.5 kg) for 5 days after your procedure or as directed by your health care provider. °· Ask your health care provider when it is okay to: °¨ Return to work or school. °¨ Resume usual physical activities or sports. °¨ Resume sexual activity. °· Do not drive home if you are discharged the same day as the procedure. Have someone else drive you. °· You may drive 24 hours after the procedure unless otherwise instructed by your health care provider. °· Do not operate machinery or power tools for 24 hours after the procedure or as directed by your health care provider. °· If your procedure was done as an  outpatient procedure, which means that you went home the same day as your procedure, a responsible adult should be with you for the first 24 hours after you arrive home. °· Keep all follow-up visits as directed by your health care provider. This is important. °SEEK MEDICAL CARE IF: °· You have a fever. °· You have chills. °· You have increased bleeding from the catheter insertion site. Hold pressure on the site. °SEEK IMMEDIATE MEDICAL CARE IF: °· You have unusual pain at the catheter insertion site. °· You have redness, warmth, or swelling at the catheter insertion site. °· You have drainage (other than a small amount of blood on the dressing) from the catheter insertion site. °· The catheter insertion site is bleeding, and the bleeding does not stop after 30 minutes of holding steady pressure on the site. °· The area near or just beyond the catheter insertion site becomes pale, cool, tingly, or numb. °  °This information is not intended to replace advice given to you by your health care provider. Make sure you discuss any questions you have with your health care provider. °  °Document Released: 02/19/2005 Document Revised: 08/24/2014 Document Reviewed: 01/04/2013 °Elsevier Interactive Patient Education ©2016 [employer] ° °

## 2016-06-02 ENCOUNTER — Encounter: Payer: Self-pay | Admitting: Vascular Surgery

## 2016-06-08 ENCOUNTER — Ambulatory Visit
Admission: RE | Admit: 2016-06-08 | Discharge: 2016-06-08 | Disposition: A | Discharge status: Home / Self Care | Payer: Medicaid Other | Source: Ambulatory Visit | Attending: Vascular Surgery | Admitting: Vascular Surgery

## 2016-06-08 ENCOUNTER — Encounter
Admission: RE | Disposition: A | Discharge status: Home / Self Care | Payer: Self-pay | Source: Ambulatory Visit | Attending: Vascular Surgery

## 2016-06-08 DIAGNOSIS — Z85118 Personal history of other malignant neoplasm of bronchus and lung: Secondary | ICD-10-CM | POA: Insufficient documentation

## 2016-06-08 DIAGNOSIS — Z9981 Dependence on supplemental oxygen: Secondary | ICD-10-CM | POA: Insufficient documentation

## 2016-06-08 DIAGNOSIS — I10 Essential (primary) hypertension: Secondary | ICD-10-CM | POA: Insufficient documentation

## 2016-06-08 DIAGNOSIS — M1990 Unspecified osteoarthritis, unspecified site: Secondary | ICD-10-CM | POA: Insufficient documentation

## 2016-06-08 DIAGNOSIS — Z801 Family history of malignant neoplasm of trachea, bronchus and lung: Secondary | ICD-10-CM | POA: Diagnosis not present

## 2016-06-08 DIAGNOSIS — E7800 Pure hypercholesterolemia, unspecified: Secondary | ICD-10-CM | POA: Insufficient documentation

## 2016-06-08 DIAGNOSIS — I70211 Atherosclerosis of native arteries of extremities with intermittent claudication, right leg: Secondary | ICD-10-CM | POA: Insufficient documentation

## 2016-06-08 DIAGNOSIS — Z87891 Personal history of nicotine dependence: Secondary | ICD-10-CM | POA: Insufficient documentation

## 2016-06-08 DIAGNOSIS — Z833 Family history of diabetes mellitus: Secondary | ICD-10-CM | POA: Insufficient documentation

## 2016-06-08 DIAGNOSIS — E119 Type 2 diabetes mellitus without complications: Secondary | ICD-10-CM | POA: Diagnosis not present

## 2016-06-08 DIAGNOSIS — Z7984 Long term (current) use of oral hypoglycemic drugs: Secondary | ICD-10-CM | POA: Insufficient documentation

## 2016-06-08 DIAGNOSIS — J449 Chronic obstructive pulmonary disease, unspecified: Secondary | ICD-10-CM | POA: Diagnosis not present

## 2016-06-08 HISTORY — PX: PERIPHERAL VASCULAR CATHETERIZATION: SHX172C

## 2016-06-08 SURGERY — LOWER EXTREMITY ANGIOGRAPHY
Anesthesia: Moderate Sedation | Laterality: Right

## 2016-06-08 MED ORDER — MIDAZOLAM HCL 5 MG/5ML IJ SOLN
INTRAMUSCULAR | Status: AC
  Filled 2016-06-08: qty 5

## 2016-06-08 MED ORDER — ACETAMINOPHEN 325 MG PO TABS: 325.0000 mg | ORAL_TABLET | ORAL | Status: DC | PRN

## 2016-06-08 MED ORDER — DEXTROSE 5 % IV SOLN
1.5000 g | Freq: Once | INTRAVENOUS | Status: AC
  Administered 2016-06-08: 1.5 g via INTRAVENOUS

## 2016-06-08 MED ORDER — HEPARIN SODIUM (PORCINE) 1000 UNIT/ML IJ SOLN
INTRAMUSCULAR | Status: DC | PRN
  Administered 2016-06-08: 4000 [IU] via INTRAVENOUS

## 2016-06-08 MED ORDER — SODIUM CHLORIDE 0.9 % IV SOLN: 500.0000 mL | Freq: Once | INTRAVENOUS | Status: DC | PRN

## 2016-06-08 MED ORDER — HEPARIN SODIUM (PORCINE) 1000 UNIT/ML IJ SOLN
INTRAMUSCULAR | Status: AC
  Filled 2016-06-08: qty 1

## 2016-06-08 MED ORDER — FENTANYL CITRATE (PF) 100 MCG/2ML IJ SOLN
INTRAMUSCULAR | Status: AC
  Filled 2016-06-08: qty 2

## 2016-06-08 MED ORDER — ONDANSETRON HCL 4 MG/2ML IJ SOLN: 4.0000 mg | Freq: Four times a day (QID) | INTRAMUSCULAR | Status: DC | PRN

## 2016-06-08 MED ORDER — PHENOL 1.4 % MT LIQD: 1.0000 | OROMUCOSAL | Status: DC | PRN

## 2016-06-08 MED ORDER — HYDRALAZINE HCL 20 MG/ML IJ SOLN: 5.0000 mg | INTRAMUSCULAR | Status: DC | PRN

## 2016-06-08 MED ORDER — MIDAZOLAM HCL 2 MG/2ML IJ SOLN
INTRAMUSCULAR | Status: DC | PRN
  Administered 2016-06-08: 2 mg via INTRAVENOUS
  Administered 2016-06-08 (×2): 1 mg via INTRAVENOUS

## 2016-06-08 MED ORDER — ACETAMINOPHEN 325 MG RE SUPP
325.0000 mg | RECTAL | Status: DC | PRN
  Filled 2016-06-08: qty 2

## 2016-06-08 MED ORDER — LABETALOL HCL 5 MG/ML IV SOLN: 10.0000 mg | INTRAVENOUS | Status: DC | PRN

## 2016-06-08 MED ORDER — IOPAMIDOL (ISOVUE-300) INJECTION 61%
INTRAVENOUS | Status: DC | PRN
  Administered 2016-06-08: 30 mL via INTRAVENOUS

## 2016-06-08 MED ORDER — LIDOCAINE-EPINEPHRINE (PF) 1 %-1:200000 IJ SOLN
INTRAMUSCULAR | Status: AC
  Filled 2016-06-08: qty 30

## 2016-06-08 MED ORDER — METOPROLOL TARTRATE 5 MG/5ML IV SOLN: 2.0000 mg | INTRAVENOUS | Status: DC | PRN

## 2016-06-08 MED ORDER — OXYCODONE-ACETAMINOPHEN 5-325 MG PO TABS: 1.0000 | ORAL_TABLET | ORAL | Status: DC | PRN

## 2016-06-08 MED ORDER — FENTANYL CITRATE (PF) 100 MCG/2ML IJ SOLN
INTRAMUSCULAR | Status: DC | PRN
  Administered 2016-06-08 (×3): 25 ug via INTRAVENOUS
  Administered 2016-06-08: 50 ug via INTRAVENOUS

## 2016-06-08 MED ORDER — SODIUM CHLORIDE 0.9 % IV SOLN: INTRAVENOUS | Status: DC

## 2016-06-08 MED ORDER — HYDROMORPHONE HCL 1 MG/ML IJ SOLN: 0.5000 mg | INTRAMUSCULAR | Status: DC | PRN

## 2016-06-08 MED ORDER — GUAIFENESIN-DM 100-10 MG/5ML PO SYRP: 15.0000 mL | ORAL_SOLUTION | ORAL | Status: DC | PRN

## 2016-06-08 SURGICAL SUPPLY — 13 items
BALLN LUTONIX 5X150X130 (BALLOONS) ×3
BALLOON LUTONIX 5X150X130 (BALLOONS) ×2 IMPLANT
CATH PIG 70CM (CATHETERS) ×3 IMPLANT
CATH RIM 65CM (CATHETERS) ×3 IMPLANT
DEVICE PRESTO INFLATION (MISCELLANEOUS) ×3 IMPLANT
DEVICE STARCLOSE SE CLOSURE (Vascular Products) ×3 IMPLANT
GLIDEWIRE ADV .035X260CM (WIRE) ×3 IMPLANT
PACK ANGIOGRAPHY (CUSTOM PROCEDURE TRAY) ×3 IMPLANT
SHEATH ANL2 6FRX45 HC (SHEATH) ×3 IMPLANT
SHEATH BRITE TIP 5FRX11 (SHEATH) ×3 IMPLANT
SYR MEDRAD MARK V 150ML (SYRINGE) ×3 IMPLANT
TUBING CONTRAST HIGH PRESS 72 (TUBING) ×3 IMPLANT
WIRE J 3MM .035X145CM (WIRE) ×3 IMPLANT

## 2016-06-08 NOTE — H&P (Signed)
[facility] History & Physical Update  The patient was interviewed and re-examined.  The patient's previous History and Physical has been reviewed and is unchanged.  There is no change in the plan of care. We plan to proceed with the scheduled procedure.  [name], MD  06/08/2016, 8:05 AM

## 2016-06-08 NOTE — Progress Notes (Signed)
Procedure done, pt tolerating well, remained stable entire procedure with vss. Will transfer to recovery area for further monitoring

## 2016-06-08 NOTE — Op Note (Signed)
[facility] Percutaneous Study/Intervention Procedural Note   Date of Surgery: 06/08/2016  Surgeon(s):Mairead Schwarzkopf   Assistants:none  Pre-operative Diagnosis: PAD with claudication right lower extremity  Post-operative diagnosis: Same  Procedure(s) Performed: 1. Ultrasound guidance for vascular access left femoral artery 2. Catheter placement into right popliteal artery from left femoral approach 3. Selective right lower extremity angiogram 4. Percutaneous transluminal angioplasty of the mid to distal SFA with 5 mm diameter by 15 cm length Lutonix drug-coated angioplasty balloon inflated twice 5. StarClose closure device left femoral artery  EBL: Minimal  Contrast: 30 cc  Fluoro Time: 2.7 minutes  Moderate Conscious Sedation Time: approximately 25 minutes using 4 mg of Versed and 125 mcg of Fentanyl  Indications: Patient is a 60 y.o.female with claudication in the right lower extremity. She has had previous intervention to the left leg. The patient is brought in for angiography for further evaluation and potential treatment. Risks and benefits are discussed and informed consent is obtained  Procedure: The patient was identified and appropriate procedural time out was performed. The patient was then placed supine on the table and prepped and draped in the usual sterile fashion.Moderate conscious sedation was administered during a face to face encounter with the patient throughout the procedure with my supervision of the RN administering medicines and monitoring the patient's vital signs, pulse oximetry, telemetry and mental status throughout from the start of the procedure until the patient was taken to the recovery room. Ultrasound was used to evaluate the left common femoral artery. It was patent . A digital ultrasound image was acquired. A Seldinger needle was used to access  the left common femoral artery under direct ultrasound guidance and a permanent image was performed. A 0.035 J wire was advanced without resistance and a 5Fr sheath was placed. She had a recent aortogram so we went ahead and cross the aortic bifurcation with a rim catheter and advanced this to the right femoral head and performed right lower extremity angiogram. Selective right lower extremity angiogram was then performed. This demonstrated Multiple areas in the mid to distal SFA with greater than 70% stenosis with one area with a greater than 90% stenosis. The popliteal artery then normalized and there was two-vessel runoff distally. The patient was systemically heparinized and a 6 [place] Ansell sheath was then placed over the [employer] wire. I then used a Kumpe catheter and the advantage wire to navigate through the SFA disease and into the popliteal artery and confirm intraluminal flow. The wire was then replaced and angioplasty was performed for the SFA disease. A 5 mm diameter by 15 cm length Lutonix drug-coated angioplasty balloon was selected. I started in the distal SFA up to the mid SFA and inflated this to 8 atm for 1 minute. The balloon was then pulled back to the more proximal to mid SFA and inflated to 12 atm for 1 minute. Completion angiogram showed markedly improved flow with about a 20-25% residual stenosis at worst in any of the locations with some nonflow limiting dissections identified the did not require stenting. I elected to terminate the procedure. The sheath was removed and StarClose closure device was deployed in the left femoral artery with excellent hemostatic result. The patient was taken to the recovery room in stable condition having tolerated the procedure well.  Findings:   Right Lower Extremity: Multiple areas in the mid to distal SFA with greater than 70% stenosis with one area with a greater than 90% stenosis. The popliteal artery then  normalized  and there was two-vessel runoff distally.   Disposition: Patient was taken to the recovery room in stable condition having tolerated the procedure well.  Complications: None  [name] 06/08/2016 8:56 AM   This note was created with Dragon Medical transcription system. Any errors in dictation are purely unintentional.

## 2016-06-08 NOTE — Discharge Instructions (Signed)
Angiogram, Care After °Refer to this sheet in the next few weeks. These instructions provide you with information about caring for yourself after your procedure. Your health care provider may also give you more specific instructions. Your treatment has been planned according to current medical practices, but problems sometimes occur. Call your health care provider if you have any problems or questions after your procedure. °WHAT TO EXPECT AFTER THE PROCEDURE °After your procedure, it is typical to have the following: °· Bruising at the catheter insertion site that usually fades within 1-2 weeks. °· Blood collecting in the tissue (hematoma) that may be painful to the touch. It should usually decrease in size and tenderness within 1-2 weeks. °HOME CARE INSTRUCTIONS °· Take medicines only as directed by your health care provider. °· You may shower 24-48 hours after the procedure or as directed by your health care provider. Remove the bandage (dressing) and gently wash the site with plain soap and water. Pat the area dry with a clean towel. Do not rub the site, because this may cause bleeding. °· Do not take baths, swim, or use a hot tub until your health care provider approves. °· Check your insertion site every day for redness, swelling, or drainage. °· Do not apply powder or lotion to the site. °· Do not lift over 10 lb (4.5 kg) for 5 days after your procedure or as directed by your health care provider. °· Ask your health care provider when it is okay to: °¨ Return to work or school. °¨ Resume usual physical activities or sports. °¨ Resume sexual activity. °· Do not drive home if you are discharged the same day as the procedure. Have someone else drive you. °· You may drive 24 hours after the procedure unless otherwise instructed by your health care provider. °· Do not operate machinery or power tools for 24 hours after the procedure or as directed by your health care provider. °· If your procedure was done as an  outpatient procedure, which means that you went home the same day as your procedure, a responsible adult should be with you for the first 24 hours after you arrive home. °· Keep all follow-up visits as directed by your health care provider. This is important. °SEEK MEDICAL CARE IF: °· You have a fever. °· You have chills. °· You have increased bleeding from the catheter insertion site. Hold pressure on the site. °SEEK IMMEDIATE MEDICAL CARE IF: °· You have unusual pain at the catheter insertion site. °· You have redness, warmth, or swelling at the catheter insertion site. °· You have drainage (other than a small amount of blood on the dressing) from the catheter insertion site. °· The catheter insertion site is bleeding, and the bleeding does not stop after 30 minutes of holding steady pressure on the site. °· The area near or just beyond the catheter insertion site becomes pale, cool, tingly, or numb. °  °This information is not intended to replace advice given to you by your health care provider. Make sure you discuss any questions you have with your health care provider. °  °Document Released: 02/19/2005 Document Revised: 08/24/2014 Document Reviewed: 01/04/2013 °Elsevier Interactive Patient Education ©2016 [employer] ° °

## 2016-06-09 ENCOUNTER — Encounter: Payer: Self-pay | Admitting: Vascular Surgery

## 2016-06-22 ENCOUNTER — Encounter (INDEPENDENT_AMBULATORY_CARE_PROVIDER_SITE_OTHER): Payer: Medicaid Other

## 2016-06-22 ENCOUNTER — Encounter (INDEPENDENT_AMBULATORY_CARE_PROVIDER_SITE_OTHER): Payer: Self-pay | Admitting: Vascular Surgery

## 2016-06-22 ENCOUNTER — Other Ambulatory Visit (INDEPENDENT_AMBULATORY_CARE_PROVIDER_SITE_OTHER): Payer: Self-pay | Admitting: Vascular Surgery

## 2016-06-22 ENCOUNTER — Ambulatory Visit (INDEPENDENT_AMBULATORY_CARE_PROVIDER_SITE_OTHER): Payer: Medicaid Other | Admitting: Vascular Surgery

## 2016-06-22 VITALS — BP 139/90 | HR 116 | Resp 16 | Ht 59.0 in | Wt 111.2 lb

## 2016-06-22 DIAGNOSIS — I739 Peripheral vascular disease, unspecified: Secondary | ICD-10-CM

## 2016-06-22 DIAGNOSIS — E785 Hyperlipidemia, unspecified: Secondary | ICD-10-CM

## 2016-06-22 DIAGNOSIS — E118 Type 2 diabetes mellitus with unspecified complications: Secondary | ICD-10-CM

## 2016-06-22 HISTORY — DX: Peripheral vascular disease, unspecified: I73.9

## 2016-06-22 NOTE — Progress Notes (Signed)
Subjective:    [name], female    DOB: January 17, 1956, 60 y.o.   MRN: [id number] Chief Complaint  Patient presents with  . Follow-up   Patient presents for her post-procedure follow up. The patient is s/p left lower extremity angiogram with angioplasty and stent placement on 06/08/16. The patient underwent an ABI which showed Right ABI: 0.90 and Left 0.63 (no previous for comparison). Patient states improvement in symptoms since intervention. The patient denies any claudication like symptoms, rest pain or ulcers to her feet / toes.    Review of Systems  Constitutional: Negative.   HENT: Negative.   Eyes: Negative.   Respiratory: Negative.   Cardiovascular: Negative.   Gastrointestinal: Negative.   Endocrine: Negative.   Genitourinary: Negative.   Musculoskeletal: Negative.   Skin: Negative.   Allergic/Immunologic: Negative.   Neurological: Negative.   Hematological: Negative.   Psychiatric/Behavioral: Negative.       Objective:   Physical Exam  Constitutional: She is oriented to person, place, and time. She appears well-developed and well-nourished.  HENT:  Head: Normocephalic and atraumatic.  Right Ear: External ear normal.  Left Ear: External ear normal.  Eyes: Conjunctivae and EOM are normal. Pupils are equal, round, and reactive to light.  Neck: Normal range of motion.  Cardiovascular: Normal rate, regular rhythm, normal heart sounds and intact distal pulses.   Pulses:      Radial pulses are 2+ on the right side, and 2+ on the left side.       Dorsalis pedis pulses are 2+ on the right side, and 1+ on the left side.       Posterior tibial pulses are 2+ on the right side, and 1+ on the left side.  Pulmonary/Chest: Effort normal and breath sounds normal.  Abdominal: Soft. Bowel sounds are normal.  Musculoskeletal: Normal range of motion. She exhibits no edema.  Neurological: She is alert and oriented to person, place, and time.  Skin: Skin is warm and dry.   Psychiatric: She has a normal mood and affect. Her behavior is normal. Judgment and thought content normal.   BP 139/90   Pulse (!) 116   Resp 16   Ht '4\' 11"'$  (1.499 m)   Wt 111 lb 3.2 oz (50.4 kg)   BMI 22.46 kg/m   Past Medical History:  Diagnosis Date  . Arthritis   . Asthma   . Cancer [facility]) 2012   Right Lung CA  . COPD (chronic obstructive pulmonary disease) ([place])   . Depression   . Diabetes mellitus without complication [facility])    Patient takes Janumet  . Hypercholesteremia   . Hypertension   . 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 Peripheral vascular disease ([place])   . Shortness of breath dyspnea    with exertion    Social History   Social History  . Marital status: Widowed    Spouse name: N/A  . Number of children: N/A  . Years of education: N/A   Occupational History  . Not on file.   Social History Main Topics  . Smoking status: Former Smoker    Packs/day: 1.00    Years: 30.00    Types: Cigarettes    Quit date: 02/06/2010  . Smokeless tobacco: Former Systems developer    Types: Snuff  . Alcohol use No     Comment: hx of etoh use   . Drug use:     Types: Marijuana     Comment:  hx of cocaine use- last use 2015   . Sexual activity: Not on file   Other Topics Concern  . Not on file   Social History Narrative  . No narrative on file   Past Surgical History:  Procedure Laterality Date  . CESAREAN SECTION     x3  . COLONOSCOPY WITH PROPOFOL N/A 06/25/2015   Procedure: COLONOSCOPY WITH PROPOFOL;  Surgeon: Lucilla Lame, MD;  Location: [facility] ENDOSCOPY;  Service: Endoscopy;  Laterality: N/A;  . CYST REMOVAL LEG     and on shoulder   . LUNG BIOPSY  05 15 2013   has lung "spots"  . PERIPHERAL VASCULAR CATHETERIZATION Left 06/01/2016   Procedure: Lower Extremity Angiography;  Surgeon: Algernon Huxley, MD;  Location: [place] [facility];  Service: Cardiovascular;  Laterality: Left;  . PERIPHERAL VASCULAR CATHETERIZATION N/A 06/01/2016   Procedure: Abdominal Aortogram w/Lower  Extremity;  Surgeon: Algernon Huxley, MD;  Location: [place] [facility];  Service: Cardiovascular;  Laterality: N/A;  . PERIPHERAL VASCULAR CATHETERIZATION  06/01/2016   Procedure: Lower Extremity Intervention;  Surgeon: Algernon Huxley, MD;  Location: [place] [facility];  Service: Cardiovascular;;  . PERIPHERAL VASCULAR CATHETERIZATION Right 06/08/2016   Procedure: Lower Extremity Angiography;  Surgeon: Algernon Huxley, MD;  Location: [place] [facility];  Service: Cardiovascular;  Laterality: Right;  . PERIPHERAL VASCULAR CATHETERIZATION  06/08/2016   Procedure: Lower Extremity Intervention;  Surgeon: Algernon Huxley, MD;  Location: [place] [facility];  Service: Cardiovascular;;   Family History  Problem Relation Age of Onset  . Lung cancer Father   . Diabetes Mother   . Diabetes Sister   . Diabetes Maternal Grandmother   . Diabetes Paternal Grandmother   . Diabetes Sister    No Known Allergies     Assessment & Plan:  Patient presents for her post-procedure follow up. The patient is s/p left lower extremity angiogram with angioplasty and stent placement on 06/08/16. The patient underwent an ABI which showed Right ABI: 0.90 and Left 0.63 (no previous for comparison). Patient states improvement in symptoms since intervention. The patient denies any claudication like symptoms, rest pain or ulcers to her feet / toes.  1. PAD (peripheral artery disease) ([place]) - Improved Studies reviewed with patient. Asymptomatic, non-critical ABI. No intervention indicated at this time.    Patient to continue medical optimization with ASA and dyslipidemia medication. Patient to remain abstinent of tobacco use. I have discussed with the patient at length the risk factors for and pathogenesis of atherosclerotic disease and encouraged a healthy diet, regular exercise regimen and blood pressure / glucose control.  The patient was encouraged to call the office in the interim if she experiences any claudication like  symptoms, rest pain or ulcers to her feet / toes. - VAS [name] ABI WITH/WO TBI; Future  2. Hyperlipidemia, unspecified hyperlipidemia type - Stable Encouraged good control as its slows the progression of atherosclerotic disease  3. Type 2 diabetes mellitus with complication, unspecified long term insulin use status (HCC) - Stable Encouraged good control as its slows the progression of atherosclerotic disease  Current Outpatient Prescriptions on File Prior to Visit  Medication Sig Dispense Refill  . albuterol (PROAIR HFA) 108 (90 BASE) MCG/ACT inhaler Inhale 2 puffs into the lungs every 4 (four) hours as needed for wheezing or shortness of breath.     Marland Kitchen albuterol (PROVENTIL) (2.5 MG/3ML) 0.083% nebulizer solution Take 2.5 mg by nebulization every 6 (six) hours as needed for wheezing.     Marland Kitchen  amLODipine (NORVASC) 10 MG tablet Take 10 mg by mouth every morning.     Marland Kitchen aspirin EC 81 MG tablet Take 1 tablet (81 mg total) by mouth daily. 150 tablet 2  . buPROPion (WELLBUTRIN) 75 MG tablet Take 75 mg by mouth 2 (two) times daily.    . clopidogrel (PLAVIX) 75 MG tablet Take 1 tablet (75 mg total) by mouth daily. 30 tablet 11  . gabapentin (NEURONTIN) 100 MG capsule Take 100 mg by mouth 2 (two) times daily.    Marland Kitchen lisinopril-hydrochlorothiazide (PRINZIDE,ZESTORETIC) 10-12.5 MG per tablet Take 1 tablet by mouth every morning.    . meloxicam (MOBIC) 7.5 MG tablet Take 7.5 mg by mouth 2 (two) times daily.    . mometasone-formoterol (DULERA) 200-5 MCG/ACT AERO Inhale 1 puff into the lungs 2 (two) times daily.    . potassium chloride (K-DUR) 10 MEQ tablet Take 10 mEq by mouth every other day. Instructed to take 58mq daily from 9/6 - 9/11 then resume taking 165m every other day.    . Marland KitchenOPINIRole (REQUIP) 0.5 MG tablet Take 0.5 mg by mouth 2 (two) times daily.     . rosuvastatin (CRESTOR) 10 MG tablet Take 10 mg by mouth at bedtime.     . SitaGLIPtin-MetFORMIN HCl (JANUMET XR) 50-500 MG TB24 Take 1 tablet by mouth  daily with breakfast.     . tiotropium (SPIRIVA HANDIHALER) 18 MCG inhalation capsule Place 18 mcg into inhaler and inhale daily.     No current facility-administered medications on file prior to visit.    There are no Patient Instructions on file for this visit. Return in about 3 months (around 09/22/2016) for Three Month Follow Up.  Mackynzie Woolford A Tempestt Silba, PA-C

## 2016-07-06 ENCOUNTER — Encounter (INDEPENDENT_AMBULATORY_CARE_PROVIDER_SITE_OTHER): Payer: Medicaid Other

## 2016-07-06 ENCOUNTER — Encounter (INDEPENDENT_AMBULATORY_CARE_PROVIDER_SITE_OTHER): Payer: Medicaid Other | Admitting: Vascular Surgery

## 2016-07-31 ENCOUNTER — Encounter: Payer: Self-pay | Admitting: Hematology and Oncology

## 2016-07-31 ENCOUNTER — Ambulatory Visit
Admission: RE | Admit: 2016-07-31 | Discharge: 2016-07-31 | Disposition: A | Discharge status: Home / Self Care | Payer: Medicaid Other | Source: Ambulatory Visit | Attending: Hematology and Oncology | Admitting: Hematology and Oncology

## 2016-07-31 ENCOUNTER — Inpatient Hospital Stay: Payer: Medicaid Other | Attending: Hematology and Oncology | Admitting: Hematology and Oncology

## 2016-07-31 VITALS — BP 103/73 | HR 90 | Temp 97.8°F | Resp 18 | Wt 116.6 lb

## 2016-07-31 DIAGNOSIS — Z9981 Dependence on supplemental oxygen: Secondary | ICD-10-CM | POA: Diagnosis not present

## 2016-07-31 DIAGNOSIS — J984 Other disorders of lung: Secondary | ICD-10-CM | POA: Insufficient documentation

## 2016-07-31 DIAGNOSIS — C3411 Malignant neoplasm of upper lobe, right bronchus or lung: Secondary | ICD-10-CM

## 2016-07-31 DIAGNOSIS — Z87440 Personal history of urinary (tract) infections: Secondary | ICD-10-CM | POA: Insufficient documentation

## 2016-07-31 DIAGNOSIS — J449 Chronic obstructive pulmonary disease, unspecified: Secondary | ICD-10-CM | POA: Diagnosis not present

## 2016-07-31 DIAGNOSIS — Z7902 Long term (current) use of antithrombotics/antiplatelets: Secondary | ICD-10-CM | POA: Insufficient documentation

## 2016-07-31 DIAGNOSIS — Z7982 Long term (current) use of aspirin: Secondary | ICD-10-CM | POA: Insufficient documentation

## 2016-07-31 DIAGNOSIS — F17210 Nicotine dependence, cigarettes, uncomplicated: Secondary | ICD-10-CM | POA: Insufficient documentation

## 2016-07-31 DIAGNOSIS — D259 Leiomyoma of uterus, unspecified: Secondary | ICD-10-CM | POA: Diagnosis not present

## 2016-07-31 DIAGNOSIS — E7800 Pure hypercholesterolemia, unspecified: Secondary | ICD-10-CM | POA: Diagnosis not present

## 2016-07-31 DIAGNOSIS — E1151 Type 2 diabetes mellitus with diabetic peripheral angiopathy without gangrene: Secondary | ICD-10-CM | POA: Insufficient documentation

## 2016-07-31 DIAGNOSIS — R970 Elevated carcinoembryonic antigen [CEA]: Secondary | ICD-10-CM | POA: Insufficient documentation

## 2016-07-31 DIAGNOSIS — E119 Type 2 diabetes mellitus without complications: Secondary | ICD-10-CM | POA: Insufficient documentation

## 2016-07-31 DIAGNOSIS — Z79899 Other long term (current) drug therapy: Secondary | ICD-10-CM | POA: Insufficient documentation

## 2016-07-31 DIAGNOSIS — Z85118 Personal history of other malignant neoplasm of bronchus and lung: Secondary | ICD-10-CM | POA: Insufficient documentation

## 2016-07-31 DIAGNOSIS — I10 Essential (primary) hypertension: Secondary | ICD-10-CM | POA: Insufficient documentation

## 2016-07-31 DIAGNOSIS — Z801 Family history of malignant neoplasm of trachea, bronchus and lung: Secondary | ICD-10-CM | POA: Diagnosis not present

## 2016-07-31 NOTE — Progress Notes (Signed)
[facility] day:  07/31/16  Chief Complaint: Angela Jensen is a 60 y.o. female with a history of stage I lung cancer who is seen for 4 month assessment.  HPI: The patient was last seen in the medical oncology clinic on 03/30/2016.  At that time, she had right lower quadrant discomfort. Exam was unremarkable.  PET scan on 03/27/2016 revealed no evidence of recurrent disease.  There was a non-metabolic soft tissue density in the right lower quadrant felt possibly die to a wandering fibroid (stable from 2012).  Pelvic MRI on 04/30/2016 revealed a 3.4 x 2.5 cm ovoid mass in the LEFT adnexa with signal characteristics identical to the intramural leiomyomas and consistent with an exophytic leiomyoma.  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  Endometrium was normal with a normal junctional zone.  Ovaries were not identified.  During the interim, she was treated for a UTI.  She states that her right sided pain went away.  She notes stents in her legs due to claudication.  She is on Plavix and aspirin.   Past Medical History:  Diagnosis Date  . Arthritis   . Asthma   . Cancer [facility]) 2012   Right Lung CA  . COPD (chronic obstructive pulmonary disease) ([place])   . Depression   . Diabetes mellitus without complication [facility])    Patient takes Janumet  . Hypercholesteremia   . Hypertension   . 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 Peripheral vascular disease ([place])   . Shortness of breath dyspnea    with exertion     Past Surgical History:  Procedure Laterality Date  . CESAREAN SECTION     x3  . COLONOSCOPY WITH PROPOFOL N/A 06/25/2015   Procedure: COLONOSCOPY WITH PROPOFOL;  Surgeon: Lucilla Lame, MD;  Location: [facility] ENDOSCOPY;  Service: Endoscopy;  Laterality: N/A;  . CYST REMOVAL LEG     and on shoulder   . LUNG BIOPSY  05 15 2013   has lung "spots"  . PERIPHERAL VASCULAR CATHETERIZATION Left 06/01/2016   Procedure: Lower Extremity Angiography;   Surgeon: Algernon Huxley, MD;  Location: [place] CV LAB;  Service: Cardiovascular;  Laterality: Left;  . PERIPHERAL VASCULAR CATHETERIZATION N/A 06/01/2016   Procedure: Abdominal Aortogram w/Lower Extremity;  Surgeon: Algernon Huxley, MD;  Location: [place] [facility];  Service: Cardiovascular;  Laterality: N/A;  . PERIPHERAL VASCULAR CATHETERIZATION  06/01/2016   Procedure: Lower Extremity Intervention;  Surgeon: Algernon Huxley, MD;  Location: [place] [facility] LAB;  Service: Cardiovascular;;  . PERIPHERAL VASCULAR CATHETERIZATION Right 06/08/2016   Procedure: Lower Extremity Angiography;  Surgeon: Algernon Huxley, MD;  Location: [place] CV LAB;  Service: Cardiovascular;  Laterality: Right;  . PERIPHERAL VASCULAR CATHETERIZATION  06/08/2016   Procedure: Lower Extremity Intervention;  Surgeon: Algernon Huxley, MD;  Location: [place] CV LAB;  Service: Cardiovascular;;    Family History  Problem Relation Age of Onset  . Lung cancer Father   . Diabetes Mother   . Diabetes Sister   . Diabetes Maternal Grandmother   . Diabetes Paternal Grandmother   . Diabetes Sister     Social History:  reports that she quit smoking about 6 years ago. Her smoking use included Cigarettes. She has a 30.00 pack-year smoking history. She has quit using smokeless tobacco. Her smokeless tobacco use included Snuff. She reports that she uses drugs, including Marijuana. She reports that she does not drink alcohol.  She has not smoked for 1 month.  She started smoking at 17 until she was "64 something".  She lives in [place].  The patient is alone today.  Allergies: No Known Allergies  Current Medications: Current Outpatient Prescriptions  Medication Sig Dispense Refill  . albuterol (PROAIR HFA) 108 (90 BASE) MCG/ACT inhaler Inhale 2 puffs into the lungs every 4 (four) hours as needed for wheezing or shortness of breath.     Marland Kitchen albuterol (PROVENTIL) (2.5 MG/3ML) 0.083% nebulizer solution Take 2.5 mg by nebulization  every 6 (six) hours as needed for wheezing.     Marland Kitchen amLODipine (NORVASC) 10 MG tablet Take 10 mg by mouth every morning.     Marland Kitchen aspirin EC 81 MG tablet Take 1 tablet (81 mg total) by mouth daily. 150 tablet 2  . buPROPion (WELLBUTRIN) 75 MG tablet Take 75 mg by mouth 2 (two) times daily.    . clopidogrel (PLAVIX) 75 MG tablet Take 1 tablet (75 mg total) by mouth daily. 30 tablet 11  . gabapentin (NEURONTIN) 100 MG capsule Take 100 mg by mouth 2 (two) times daily.    Marland Kitchen lisinopril-hydrochlorothiazide (PRINZIDE,ZESTORETIC) 10-12.5 MG per tablet Take 1 tablet by mouth every morning.    . meloxicam (MOBIC) 7.5 MG tablet Take 7.5 mg by mouth 2 (two) times daily.    . mometasone-formoterol (DULERA) 200-5 MCG/ACT AERO Inhale 1 puff into the lungs 2 (two) times daily.    . potassium chloride (K-DUR) 10 MEQ tablet Take 10 mEq by mouth every other day. Instructed to take 71mq daily from 9/6 - 9/11 then resume taking 114m every other day.    . Marland KitchenOPINIRole (REQUIP) 0.5 MG tablet Take 0.5 mg by mouth 2 (two) times daily.     . rosuvastatin (CRESTOR) 10 MG tablet Take 10 mg by mouth at bedtime.     . SitaGLIPtin-MetFORMIN HCl (JANUMET XR) 50-500 MG TB24 Take 1 tablet by mouth daily with breakfast.     . tiotropium (SPIRIVA HANDIHALER) 18 MCG inhalation capsule Place 18 mcg into inhaler and inhale daily.     No current facility-administered medications for this visit.     Review of Systems:  GENERAL:  Feels "good".  No fevers or sweats.  Weight up 7 pounds. PERFORMANCE STATUS (ECOG):  1 HEENT:  No visual changes, runny nose, sore throat, mouth sores or tenderness. Lungs: No shortness of breath or cough.  No hemoptysis. Cardiac:  No chest pain, palpitations, orthopnea, or PND. GI:  Right lower side pain, resolved.  No nausea, vomiting, diarrhea, constipation, melena or hematochezia.  Prior colonoscopy. GU:  No urgency, frequency, dysuria, or hematuria. Musculoskeletal:  No back pain.  No joint pain.  No  muscle tenderness. Extremities:  No pain or swelling. Skin:  No rashes or skin changes. Neuro:  No headache, numbness or weakness, balance or coordination issues. Endocrine:  Diabetes.  No thyroid issues, hot flashes or night sweats. Psych:  No mood changes, depression or anxiety. Pain:  No focal pain. Review of systems:  All other systems reviewed and found to be negative.  Physical Exam: Blood pressure 103/73, pulse 90, temperature 97.8 F (36.6 C), temperature source Tympanic, resp. rate 18, weight 116 lb 10 oz (52.9 kg). GENERAL:  Thin woman sitting comfortably in the exam room in no acute distress. MENTAL STATUS:  Alert and oriented to person, place and time. HEAD:  Shoulder length black hair pulled back.  Normocephalic, atraumatic, face symmetric, no Cushingoid features. EYES:  Brown eyes.  Pupils  equal round and reactive to light and accomodation.  No conjunctivitis or scleral icterus. ENT:  Oropharynx clear without lesion.  Edentulous. Tongue normal. Mucous membranes moist.  RESPIRATORY:  Clear to auscultation without rales, wheezes or rhonchi. CARDIOVASCULAR:  Regular rate and rhythm without murmur, rub or gallop. ABDOMEN:  Soft, non-tender, with active bowel sounds, and no hepatosplenomegaly.  No masses. SKIN:  No rashes, ulcers or lesions. EXTREMITIES: No edema, no skin discoloration or tenderness.  No palpable cords. LYMPH NODES: No palpable cervical, supraclavicular, axillary or inguinal adenopathy  NEUROLOGICAL: Unremarkable. PSYCH:  Appropriate.   No visits with results within 3 Day(s) from this visit.  Latest known visit with results is:  Admission on 06/01/2016, Discharged on 06/01/2016  Component Date Value Ref Range Status  . WBC 06/01/2016 4.2  3.6 - 11.0 K/uL Final  . RBC 06/01/2016 4.01  3.80 - 5.20 MIL/uL Final  . Hemoglobin 06/01/2016 11.9* 12.0 - 16.0 g/dL Final  . HCT 06/01/2016 35.5  35.0 - 47.0 % Final  . MCV 06/01/2016 88.6  80.0 - 100.0 fL Final  .  MCH 06/01/2016 29.8  26.0 - 34.0 pg Final  . MCHC 06/01/2016 33.7  32.0 - 36.0 g/dL Final  . RDW 06/01/2016 13.7  11.5 - 14.5 % Final  . Platelets 06/01/2016 216  150 - 440 K/uL Final  . Sodium 06/01/2016 139  135 - 145 mmol/L Final  . Potassium 06/01/2016 3.7  3.5 - 5.1 mmol/L Final  . Chloride 06/01/2016 107  101 - 111 mmol/L Final  . CO2 06/01/2016 29  22 - 32 mmol/L Final  . Glucose, Bld 06/01/2016 121* 65 - 99 mg/dL Final  . BUN 06/01/2016 19  6 - 20 mg/dL Final  . Creatinine, Ser 06/01/2016 0.71  0.44 - 1.00 mg/dL Final  . Calcium 06/01/2016 8.7* 8.9 - 10.3 mg/dL Final  . Total Protein 06/01/2016 7.0  6.5 - 8.1 g/dL Final  . Albumin 06/01/2016 3.9  3.5 - 5.0 g/dL Final  . AST 06/01/2016 21  15 - 41 U/L Final  . ALT 06/01/2016 18  14 - 54 U/L Final  . Alkaline Phosphatase 06/01/2016 56  38 - 126 U/L Final  . Total Bilirubin 06/01/2016 0.2* 0.3 - 1.2 mg/dL Final  . GFR calc non Af Amer 06/01/2016 >60  >60 mL/min Final  . GFR calc Af Amer 06/01/2016 >60  >60 mL/min Final   Comment: (NOTE) The eGFR has been calculated using the CKD EPI equation. This calculation has not been validated in all clinical situations. eGFR's persistently <60 mL/min signify possible Chronic Kidney Disease.   Georgiann Hahn gap 06/01/2016 3* 5 - 15 Final    Assessment:  RAIN WILHIDE is a 60 y.o. 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   She was not a good surgery candidate secondary to her lung function (FEV1 28%).  Chest CT on 07/31/2011 revealed a 1.1 x 0.6 cm RUL nodule and a 9 mm inferior aspect RUL nodule and a 4 mm nodule in the anterior aspect of the inferior aspect of the RUL.  PET scan on 08/03/2011 revealed a 10-mm spiculated right apical pulmonary nodule (SUV 2.5) suspcious for lung cancer. Left ovary was prominent (3.9 x 2.4 cm).   CA-125 was 9.3 (normal).  CT guided biopsy of the superior and inferior RUL nodules on 12/30/2011 revealed well  differentiated adenocarcinoma of lung primary.  EGFR testing revealed no mutation.  She received  IMRT 6000 cGy to both nodules from 01/26/2012 - 03/10/2012.  PET scan on 01/30/2015 revealed a hypermetabolic and enlarging RUL pulmonary nodules concerning for lung cancer recurrence.  There was a small focus of peripheral nodular consolidation in the superior segment of the right lower lobe with differential including inflammation/infection versus neoplasm.  There was no evidence of mediastinal nodal metastasis.    PET scan on 03/27/2016 revealed no hypermetabolic activity to suggest recurrent malignancy. There was a soft tissue density in the left pelvis near the inguinal ring that was not hypermetabolic, but prominent and probably separate from the left ovary. This could be a wandering fibroid.  Although the overall appearance had not changed from 2012 and was not hypermetabolic, pelvic sonography or pelvic MRI for further characterization of the uterus and ovaries can be considered.  In 04/2015, there was an attempt at radiofrequency ablation of 1 enlarging RUL nodule.  At the time of the procedure, the nodule had shrunk to 5 mm from 9 mm.  Chest CT on 08/01/2015 revealed complete resolution of the prior nodule in the right lung apex.  Chest CT on 01/31/2016 revealed no active cardiopulmonary abnormalities.  There was stable appearance of right apical and right middle lobe scar like densities.  She has an elevated CEA of unclear etiology.  CEA was 8.1 on 01/31/2016 and 9.2 on 03/03/2016.  Colonoscopy on 06/25/2015 revealed a 4 mm polyp in the sigmoid colon.  Pathology revealed a tubular adenoma without evidence of dysplasia or malignancy.  Pelvic MRI on 04/30/2016 revealed a 3.4 x 2.5 cm ovoid mass in the LEFT adnexa with signal characteristics identical to the intramural leiomyomas and consistent with an exophytic leiomyoma.  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  Endometrium was normal with a normal  junctional zone.  Ovaries were not identified.  Symptomatically, she denies any pain. Exam is unremarkable.   Plan: 1.  Review pelvic MRI.  Imaging reveals leiomyomas. 2.  Review last PET scan.  No evidence of malignancy.  Plan reimaging if symptoms or increase in CEA (> 25%). 3.  Follow-up imaging (chest CT every 6 months for 2-3 years (complete) then annual low dose screening chest CT). 4.  CXR (PA and lateral) today. 5.  Burgess Estelle to talk to patient about annual low dose chest CT screening program. 6.  RTC as previously scheduled in 01/2017.   Addendum:  CXR today revealed COPD. There was parenchymal scarring.  There was no acute cardiopulmonary abnormality.  There were no findings suspicious for recurrent malignancy.   Lequita Asal, MD  07/31/2016, 3:27 PM

## 2016-07-31 NOTE — Progress Notes (Signed)
Patients offers no complaints today.

## 2016-08-02 ENCOUNTER — Other Ambulatory Visit: Payer: Self-pay | Admitting: *Deleted

## 2016-08-02 DIAGNOSIS — C3411 Malignant neoplasm of upper lobe, right bronchus or lung: Secondary | ICD-10-CM

## 2016-08-06 ENCOUNTER — Telehealth: Payer: Self-pay | Admitting: *Deleted

## 2016-08-06 NOTE — Telephone Encounter (Signed)
Received referral for initial lung cancer screening scan. Contacted patient and discussed plan to schedule CT scan in June of 2018. Patient is in agreement with this plan. Patient will be contacted in late May to arrange for shared decision making visit/ initial lung cancer screening scan.

## 2016-09-25 ENCOUNTER — Encounter (INDEPENDENT_AMBULATORY_CARE_PROVIDER_SITE_OTHER): Payer: Medicaid Other

## 2016-09-25 ENCOUNTER — Ambulatory Visit (INDEPENDENT_AMBULATORY_CARE_PROVIDER_SITE_OTHER): Payer: Medicaid Other | Admitting: Vascular Surgery

## 2016-11-04 ENCOUNTER — Encounter (INDEPENDENT_AMBULATORY_CARE_PROVIDER_SITE_OTHER): Payer: Self-pay | Admitting: Vascular Surgery

## 2016-11-04 ENCOUNTER — Ambulatory Visit (INDEPENDENT_AMBULATORY_CARE_PROVIDER_SITE_OTHER): Payer: Medicaid Other

## 2016-11-04 ENCOUNTER — Ambulatory Visit (INDEPENDENT_AMBULATORY_CARE_PROVIDER_SITE_OTHER): Payer: Medicaid Other | Admitting: Vascular Surgery

## 2016-11-04 ENCOUNTER — Encounter (INDEPENDENT_AMBULATORY_CARE_PROVIDER_SITE_OTHER): Payer: Self-pay

## 2016-11-04 VITALS — BP 130/91 | HR 105 | Resp 15 | Ht 59.0 in | Wt 113.0 lb

## 2016-11-04 DIAGNOSIS — E118 Type 2 diabetes mellitus with unspecified complications: Secondary | ICD-10-CM

## 2016-11-04 DIAGNOSIS — I739 Peripheral vascular disease, unspecified: Secondary | ICD-10-CM

## 2016-11-04 DIAGNOSIS — E785 Hyperlipidemia, unspecified: Secondary | ICD-10-CM

## 2016-11-16 NOTE — Progress Notes (Signed)
Subjective:    [name], female    DOB: 06-Jan-1956, 61 y.o.   MRN: [id number] Chief Complaint  Patient presents with  . Re-evaluation    3 month follow up   Patient presents for a three month PAD follow up. The patient underwent an ABI which showed no right lower extremity atherosclerotic disease and moderate left lower extremity disease. A left lower extremity arterial duplex is notable for occluded SFA stent with reconstituting collaterals with no hemodynamically significant stenosis in the remainder of the LLE. The patient denies any claudication like symptoms, rest pain or ulcers to her feet / toes.    Review of Systems  All other systems reviewed and are negative.      Objective:   Physical Exam  Constitutional: She is oriented to person, place, and time. She appears well-developed and well-nourished. No distress.  HENT:  Head: Normocephalic and atraumatic.  Eyes: Conjunctivae are normal. Pupils are equal, round, and reactive to light.  Neck: Normal range of motion.  Cardiovascular: Normal rate, regular rhythm, normal heart sounds and intact distal pulses.   Pulses:      Radial pulses are 2+ on the right side, and 2+ on the left side.       Dorsalis pedis pulses are 2+ on the right side, and 1+ on the left side.       Posterior tibial pulses are 2+ on the right side, and 0 on the left side.  Pulmonary/Chest: Effort normal.  Musculoskeletal: Normal range of motion. She exhibits no edema.  Neurological: She is alert and oriented to person, place, and time.  Skin: Skin is warm and dry. She is not diaphoretic.  Psychiatric: She has a normal mood and affect. Her behavior is normal. Judgment and thought content normal.  Vitals reviewed.  BP (!) 130/91 (BP Location: Right Arm)   Pulse (!) 105   Resp 15   Ht '4\' 11"'$  (1.499 m)   Wt 113 lb (51.3 kg)   BMI 22.82 kg/m   Past Medical History:  Diagnosis Date  . Arthritis   . Asthma   . Cancer [facility]) 2012   Right Lung CA  . COPD (chronic obstructive pulmonary disease) ([place])   . Depression   . Diabetes mellitus without complication [facility])    Patient takes Janumet  . Hypercholesteremia   . Hypertension   . 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 Peripheral vascular disease ([place])   . Shortness of breath dyspnea    with exertion    Social History   Social History  . Marital status: Widowed    Spouse name: N/A  . Number of children: N/A  . Years of education: N/A   Occupational History  . Not on file.   Social History Main Topics  . Smoking status: Former Smoker    Packs/day: 1.00    Years: 30.00    Types: Cigarettes    Quit date: 02/06/2010  . Smokeless tobacco: Former Systems developer    Types: Snuff  . Alcohol use No     Comment: hx of etoh use   . Drug use: Yes    Types: Marijuana     Comment: hx of cocaine use- last use 2015   . Sexual activity: Not on file   Other Topics Concern  . Not on file   Social History Narrative  . No narrative on file   Past Surgical History:  Procedure Laterality Date  . CESAREAN  SECTION     x3  . COLONOSCOPY WITH PROPOFOL N/A 06/25/2015   Procedure: COLONOSCOPY WITH PROPOFOL;  Surgeon: Lucilla Lame, MD;  Location: [facility] ENDOSCOPY;  Service: Endoscopy;  Laterality: N/A;  . CYST REMOVAL LEG     and on shoulder   . LUNG BIOPSY  05 15 2013   has lung "spots"  . PERIPHERAL VASCULAR CATHETERIZATION Left 06/01/2016   Procedure: Lower Extremity Angiography;  Surgeon: Algernon Huxley, MD;  Location: [place] [facility] LAB;  Service: Cardiovascular;  Laterality: Left;  . PERIPHERAL VASCULAR CATHETERIZATION N/A 06/01/2016   Procedure: Abdominal Aortogram w/Lower Extremity;  Surgeon: Algernon Huxley, MD;  Location: [place] [facility] LAB;  Service: Cardiovascular;  Laterality: N/A;  . PERIPHERAL VASCULAR CATHETERIZATION  06/01/2016   Procedure: Lower Extremity Intervention;  Surgeon: Algernon Huxley, MD;  Location: [place] [facility] LAB;  Service: Cardiovascular;;  . PERIPHERAL  VASCULAR CATHETERIZATION Right 06/08/2016   Procedure: Lower Extremity Angiography;  Surgeon: Algernon Huxley, MD;  Location: [place] CV LAB;  Service: Cardiovascular;  Laterality: Right;  . PERIPHERAL VASCULAR CATHETERIZATION  06/08/2016   Procedure: Lower Extremity Intervention;  Surgeon: Algernon Huxley, MD;  Location: [place] CV LAB;  Service: Cardiovascular;;   Family History  Problem Relation Age of Onset  . Lung cancer Father   . Diabetes Mother   . Diabetes Sister   . Diabetes Maternal Grandmother   . Diabetes Paternal Grandmother   . Diabetes Sister    No Known Allergies     Assessment & Plan:  Patient presents for a three month PAD follow up. The patient underwent an ABI which showed no right lower extremity atherosclerotic disease and moderate left lower extremity disease. A left lower extremity arterial duplex is notable for occluded SFA stent with reconstituting collaterals with no hemodynamically significant stenosis in the remainder of the LLE. The patient denies any claudication like symptoms, rest pain or ulcers to her feet / toes.   1. PAD (peripheral artery disease) (HCC) - Stable Occluded left SFA stent with reconstitution. ABI stable. Patient is asymptomatic at this time. No indication for intervention at this time. I have discussed with the patient at length the risk factors for and pathogenesis of atherosclerotic disease and encouraged a healthy diet, regular exercise regimen and blood pressure / glucose control.  The patient was encouraged to call the office in the interim if he experiences any claudication like symptoms, rest pain or ulcers to her feet / toes.  - VAS [name] ABI WITH/WO TBI; Future - VAS [name] LOWER EXTREMITY ARTERIAL DUPLEX; Future  2. Hyperlipidemia, unspecified hyperlipidemia type - Stable Encouraged good control as its slows the progression of atherosclerotic disease  3. Type 2 diabetes mellitus with complication, unspecified long term  insulin use status (HCC) - Stable Encouraged good control as its slows the progression of atherosclerotic disease  Current Outpatient Prescriptions on File Prior to Visit  Medication Sig Dispense Refill  . albuterol (PROAIR HFA) 108 (90 BASE) MCG/ACT inhaler Inhale 2 puffs into the lungs every 4 (four) hours as needed for wheezing or shortness of breath.     Marland Kitchen albuterol (PROVENTIL) (2.5 MG/3ML) 0.083% nebulizer solution Take 2.5 mg by nebulization every 6 (six) hours as needed for wheezing.     Marland Kitchen amLODipine (NORVASC) 10 MG tablet Take 10 mg by mouth every morning.     Marland Kitchen aspirin EC 81 MG tablet Take 1 tablet (81 mg total) by mouth daily. 150 tablet 2  . buPROPion (  WELLBUTRIN) 75 MG tablet Take 75 mg by mouth 2 (two) times daily.    . clopidogrel (PLAVIX) 75 MG tablet Take 1 tablet (75 mg total) by mouth daily. 30 tablet 11  . gabapentin (NEURONTIN) 100 MG capsule Take 100 mg by mouth 2 (two) times daily.    Marland Kitchen lisinopril-hydrochlorothiazide (PRINZIDE,ZESTORETIC) 10-12.5 MG per tablet Take 1 tablet by mouth every morning.    . meloxicam (MOBIC) 7.5 MG tablet Take 7.5 mg by mouth 2 (two) times daily.    . mometasone-formoterol (DULERA) 200-5 MCG/ACT AERO Inhale 1 puff into the lungs 2 (two) times daily.    . potassium chloride (K-DUR) 10 MEQ tablet Take 10 mEq by mouth every other day. Instructed to take 80mq daily from 9/6 - 9/11 then resume taking 159m every other day.    . Marland KitchenOPINIRole (REQUIP) 0.5 MG tablet Take 0.5 mg by mouth 2 (two) times daily.     . rosuvastatin (CRESTOR) 10 MG tablet Take 10 mg by mouth at bedtime.     . SitaGLIPtin-MetFORMIN HCl (JANUMET XR) 50-500 MG TB24 Take 1 tablet by mouth daily with breakfast.     . tiotropium (SPIRIVA HANDIHALER) 18 MCG inhalation capsule Place 18 mcg into inhaler and inhale daily.     No current facility-administered medications on file prior to visit.     There are no Patient Instructions on file for this visit. No Follow-up on  file.   KIMBERLY A STEGMAYER, PA-C

## 2017-01-18 ENCOUNTER — Encounter: Payer: Self-pay | Admitting: *Deleted

## 2017-01-18 ENCOUNTER — Telehealth: Payer: Self-pay | Admitting: *Deleted

## 2017-01-18 DIAGNOSIS — Z87891 Personal history of nicotine dependence: Secondary | ICD-10-CM

## 2017-01-18 NOTE — Telephone Encounter (Signed)
Received referral for initial lung cancer screening scan. Contacted patient and obtained smoking history,(former, quit 02/06/10, 33.3 pack year) as well as answering questions related to screening process. Patient denies signs of lung cancer such as weight loss or hemoptysis. Patient denies comorbidity that would prevent curative treatment if lung cancer were found. Patient is scheduled for shared decision making visit and CT scan on 02/02/17.

## 2017-01-27 ENCOUNTER — Ambulatory Visit: Payer: Medicaid Other

## 2017-01-29 ENCOUNTER — Other Ambulatory Visit: Payer: Medicaid Other

## 2017-01-29 ENCOUNTER — Ambulatory Visit: Payer: Medicaid Other | Admitting: Hematology and Oncology

## 2017-02-02 ENCOUNTER — Inpatient Hospital Stay: Payer: Medicaid Other | Attending: Oncology | Admitting: Oncology

## 2017-02-02 ENCOUNTER — Ambulatory Visit
Admission: RE | Admit: 2017-02-02 | Discharge: 2017-02-02 | Disposition: A | Discharge status: Home / Self Care | Payer: Medicaid Other | Source: Ambulatory Visit | Attending: Oncology | Admitting: Oncology

## 2017-02-02 DIAGNOSIS — I700 Atherosclerosis of aorta: Secondary | ICD-10-CM | POA: Insufficient documentation

## 2017-02-02 DIAGNOSIS — Z87891 Personal history of nicotine dependence: Secondary | ICD-10-CM

## 2017-02-02 DIAGNOSIS — Z79899 Other long term (current) drug therapy: Secondary | ICD-10-CM | POA: Insufficient documentation

## 2017-02-02 DIAGNOSIS — D251 Intramural leiomyoma of uterus: Secondary | ICD-10-CM | POA: Insufficient documentation

## 2017-02-02 DIAGNOSIS — F17210 Nicotine dependence, cigarettes, uncomplicated: Secondary | ICD-10-CM | POA: Insufficient documentation

## 2017-02-02 DIAGNOSIS — J439 Emphysema, unspecified: Secondary | ICD-10-CM | POA: Diagnosis not present

## 2017-02-02 DIAGNOSIS — R911 Solitary pulmonary nodule: Secondary | ICD-10-CM | POA: Diagnosis not present

## 2017-02-02 DIAGNOSIS — Z85118 Personal history of other malignant neoplasm of bronchus and lung: Secondary | ICD-10-CM | POA: Insufficient documentation

## 2017-02-02 DIAGNOSIS — M1990 Unspecified osteoarthritis, unspecified site: Secondary | ICD-10-CM | POA: Insufficient documentation

## 2017-02-02 DIAGNOSIS — Z122 Encounter for screening for malignant neoplasm of respiratory organs: Secondary | ICD-10-CM

## 2017-02-02 DIAGNOSIS — E7800 Pure hypercholesterolemia, unspecified: Secondary | ICD-10-CM | POA: Insufficient documentation

## 2017-02-02 DIAGNOSIS — Z9981 Dependence on supplemental oxygen: Secondary | ICD-10-CM | POA: Insufficient documentation

## 2017-02-02 DIAGNOSIS — I10 Essential (primary) hypertension: Secondary | ICD-10-CM | POA: Insufficient documentation

## 2017-02-02 DIAGNOSIS — E1151 Type 2 diabetes mellitus with diabetic peripheral angiopathy without gangrene: Secondary | ICD-10-CM | POA: Insufficient documentation

## 2017-02-02 DIAGNOSIS — Z801 Family history of malignant neoplasm of trachea, bronchus and lung: Secondary | ICD-10-CM | POA: Insufficient documentation

## 2017-02-02 DIAGNOSIS — J449 Chronic obstructive pulmonary disease, unspecified: Secondary | ICD-10-CM | POA: Insufficient documentation

## 2017-02-02 DIAGNOSIS — Z7982 Long term (current) use of aspirin: Secondary | ICD-10-CM | POA: Insufficient documentation

## 2017-02-03 DIAGNOSIS — Z87891 Personal history of nicotine dependence: Secondary | ICD-10-CM | POA: Insufficient documentation

## 2017-02-03 NOTE — Progress Notes (Signed)
In accordance with CMS guidelines, patient has met eligibility criteria including age, absence of signs or symptoms of lung cancer.  Social History  Substance Use Topics  . Smoking status: Former Smoker    Packs/day: 0.90    Years: 37.00    Types: Cigarettes    Quit date: 02/06/2010  . Smokeless tobacco: Former Systems developer    Types: Snuff  . Alcohol use No     Comment: hx of etoh use      A shared decision-making session was conducted prior to the performance of CT scan. This includes one or more decision aids, includes benefits and harms of screening, follow-up diagnostic testing, over-diagnosis, false positive rate, and total radiation exposure.  Counseling on the importance of adherence to annual lung cancer LDCT screening, impact of co-morbidities, and ability or willingness to undergo diagnosis and treatment is imperative for compliance of the program.  Counseling on the importance of continued smoking cessation for former smokers; the importance of smoking cessation for current smokers, and information about tobacco cessation interventions have been given to patient including [place] and 1800 quit [place] programs.  Written order for lung cancer screening with LDCT has been given to the patient and any and all questions have been answered to the best of my abilities.   Yearly follow up will be coordinated by Burgess Estelle, Thoracic Navigator.

## 2017-02-04 ENCOUNTER — Inpatient Hospital Stay (HOSPITAL_BASED_OUTPATIENT_CLINIC_OR_DEPARTMENT_OTHER): Payer: Medicaid Other | Admitting: Hematology and Oncology

## 2017-02-04 ENCOUNTER — Inpatient Hospital Stay: Payer: Medicaid Other

## 2017-02-04 ENCOUNTER — Telehealth: Payer: Self-pay | Admitting: *Deleted

## 2017-02-04 ENCOUNTER — Encounter: Payer: Self-pay | Admitting: Hematology and Oncology

## 2017-02-04 VITALS — BP 104/74 | HR 95 | Temp 98.6°F | Resp 18 | Ht 59.0 in | Wt 113.0 lb

## 2017-02-04 DIAGNOSIS — Z9981 Dependence on supplemental oxygen: Secondary | ICD-10-CM | POA: Diagnosis not present

## 2017-02-04 DIAGNOSIS — Z79899 Other long term (current) drug therapy: Secondary | ICD-10-CM | POA: Diagnosis not present

## 2017-02-04 DIAGNOSIS — Z85118 Personal history of other malignant neoplasm of bronchus and lung: Secondary | ICD-10-CM

## 2017-02-04 DIAGNOSIS — I10 Essential (primary) hypertension: Secondary | ICD-10-CM

## 2017-02-04 DIAGNOSIS — E1151 Type 2 diabetes mellitus with diabetic peripheral angiopathy without gangrene: Secondary | ICD-10-CM | POA: Diagnosis not present

## 2017-02-04 DIAGNOSIS — C3411 Malignant neoplasm of upper lobe, right bronchus or lung: Secondary | ICD-10-CM

## 2017-02-04 DIAGNOSIS — E7800 Pure hypercholesterolemia, unspecified: Secondary | ICD-10-CM

## 2017-02-04 DIAGNOSIS — Z801 Family history of malignant neoplasm of trachea, bronchus and lung: Secondary | ICD-10-CM

## 2017-02-04 DIAGNOSIS — D251 Intramural leiomyoma of uterus: Secondary | ICD-10-CM | POA: Diagnosis not present

## 2017-02-04 DIAGNOSIS — Z7982 Long term (current) use of aspirin: Secondary | ICD-10-CM | POA: Diagnosis not present

## 2017-02-04 DIAGNOSIS — F17210 Nicotine dependence, cigarettes, uncomplicated: Secondary | ICD-10-CM | POA: Diagnosis not present

## 2017-02-04 DIAGNOSIS — M1990 Unspecified osteoarthritis, unspecified site: Secondary | ICD-10-CM | POA: Diagnosis not present

## 2017-02-04 DIAGNOSIS — J449 Chronic obstructive pulmonary disease, unspecified: Secondary | ICD-10-CM

## 2017-02-04 LAB — CBC WITH DIFFERENTIAL/PLATELET
Basophils Absolute: 0 10*3/uL (ref 0–0.1)
Basophils Relative: 1 %
Eosinophils Absolute: 0.1 10*3/uL (ref 0–0.7)
Eosinophils Relative: 2 %
HCT: 36.6 % (ref 35.0–47.0)
Hemoglobin: 12.5 g/dL (ref 12.0–16.0)
Lymphocytes Relative: 27 %
Lymphs Abs: 1.3 10*3/uL (ref 1.0–3.6)
MCH: 29.8 pg (ref 26.0–34.0)
MCHC: 34 g/dL (ref 32.0–36.0)
MCV: 87.6 fL (ref 80.0–100.0)
Monocytes Absolute: 0.4 10*3/uL (ref 0.2–0.9)
Monocytes Relative: 9 %
Neutro Abs: 3.1 10*3/uL (ref 1.4–6.5)
Neutrophils Relative %: 61 %
Platelets: 235 10*3/uL (ref 150–440)
RBC: 4.18 MIL/uL (ref 3.80–5.20)
RDW: 13.3 % (ref 11.5–14.5)
WBC: 4.9 10*3/uL (ref 3.6–11.0)

## 2017-02-04 LAB — COMPREHENSIVE METABOLIC PANEL
ALT: 16 U/L (ref 14–54)
AST: 25 U/L (ref 15–41)
Albumin: 4.4 g/dL (ref 3.5–5.0)
Alkaline Phosphatase: 57 U/L (ref 38–126)
Anion gap: 9 (ref 5–15)
BUN: 16 mg/dL (ref 6–20)
CO2: 27 mmol/L (ref 22–32)
Calcium: 9.3 mg/dL (ref 8.9–10.3)
Chloride: 97 mmol/L — ABNORMAL LOW (ref 101–111)
Creatinine, Ser: 0.86 mg/dL (ref 0.44–1.00)
GFR calc Af Amer: >60 mL/min (ref >60)
GFR calc non Af Amer: >60 mL/min (ref >60)
Glucose, Bld: 85 mg/dL (ref 65–99)
Potassium: 3.8 mmol/L (ref 3.5–5.1)
Sodium: 133 mmol/L — ABNORMAL LOW (ref 135–145)
Total Bilirubin: 0.6 mg/dL (ref 0.3–1.2)
Total Protein: 7.7 g/dL (ref 6.5–8.1)

## 2017-02-04 NOTE — Telephone Encounter (Signed)
Notified patient of LDCT lung cancer screening program results with recommendation for 6 month follow up imaging. Also notified of incidental findings noted below and is encouraged to discuss further with referring provider or PCP who will receive a copy of this note and/or the CT report. Patient verbalizes understanding.   IMPRESSION: 1. New ground-glass nodule in the right lower lobe. Lung-RADS 3, probably benign findings. Short-term follow-up in 6 months is recommended with repeat low-dose chest CT without contrast (please use the following order, "CT CHEST LCS NODULE FOLLOW-UP W/O CM"). These results will be called to the ordering clinician or representative by the Radiologist Assistant, and communication documented in the PACS or zVision Dashboard. 2.  Aortic atherosclerosis (ICD10-170.0). 3.  Emphysema (ICD10-J43.9).

## 2017-02-04 NOTE — Progress Notes (Signed)
[facility] day:  02/04/17  Chief Complaint: Angela Jensen is a 61 y.o. female with a history of stage I lung cancer who is seen for 6 month assessment.  HPI: The patient was last seen in the medical oncology clinic on 07/31/2016.  At that time, she denied any pain. Exam is unremarkable.  CXR revealed COPD. There was parenchymal scarring.  There was no acute cardiopulmonary abnormality.  There were no findings suspicious for recurrent malignancy.  At last visit, we discussed annual low dose screening chest CT.  CT chest lung cancer screening low dose on 02/02/2017 revealed new ground-glass nodule in the right lower lobe. Lung-RADS 3, probably benign findings. Short-term follow-up in 6 months was recommended with repeat low-dose chest CT without contrast was recommended.  Symptomatically, patient has no physical complaints today. She states that she is "more active".  Patient reports that she is feeling "good". (+) weight loss of 3 pounds since most recent visit. Patient reports that she is hungry during today's visit to the clinic.  She has a good appetite.    Past Medical History:  Diagnosis Date  . Arthritis   . Asthma   . Cancer [facility]) 2012   Right Lung CA  . COPD (chronic obstructive pulmonary disease) ([place])   . Depression   . Diabetes mellitus without complication [facility])    Patient takes Janumet  . Hypercholesteremia   . Hypertension   . 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 Peripheral vascular disease ([place])   . Shortness of breath dyspnea    with exertion     Past Surgical History:  Procedure Laterality Date  . CESAREAN SECTION     x3  . COLONOSCOPY WITH PROPOFOL N/A 06/25/2015   Procedure: COLONOSCOPY WITH PROPOFOL;  Surgeon: Lucilla Lame, MD;  Location: [facility] ENDOSCOPY;  Service: Endoscopy;  Laterality: N/A;  . CYST REMOVAL LEG     and on shoulder   . LUNG BIOPSY  05 15 2013   has lung "spots"  . PERIPHERAL VASCULAR CATHETERIZATION  Left 06/01/2016   Procedure: Lower Extremity Angiography;  Surgeon: Algernon Huxley, MD;  Location: [place] [facility];  Service: Cardiovascular;  Laterality: Left;  . PERIPHERAL VASCULAR CATHETERIZATION N/A 06/01/2016   Procedure: Abdominal Aortogram w/Lower Extremity;  Surgeon: Algernon Huxley, MD;  Location: [place] [facility];  Service: Cardiovascular;  Laterality: N/A;  . PERIPHERAL VASCULAR CATHETERIZATION  06/01/2016   Procedure: Lower Extremity Intervention;  Surgeon: Algernon Huxley, MD;  Location: [place] [facility];  Service: Cardiovascular;;  . PERIPHERAL VASCULAR CATHETERIZATION Right 06/08/2016   Procedure: Lower Extremity Angiography;  Surgeon: Algernon Huxley, MD;  Location: [place] [facility];  Service: Cardiovascular;  Laterality: Right;  . PERIPHERAL VASCULAR CATHETERIZATION  06/08/2016   Procedure: Lower Extremity Intervention;  Surgeon: Algernon Huxley, MD;  Location: [place] [facility];  Service: Cardiovascular;;    Family History  Problem Relation Age of Onset  . Lung cancer Father   . Diabetes Mother   . Diabetes Sister   . Diabetes Maternal Grandmother   . Diabetes Paternal Grandmother   . Diabetes Sister     Social History:  reports that she quit smoking about 7 years ago. Her smoking use included Cigarettes. She has a 33.30 pack-year smoking history. She has quit using smokeless tobacco. Her smokeless tobacco use included Snuff. She reports that she uses drugs, including Marijuana. She reports that she  does not drink alcohol.  She has not smoked for 1 month.  She started smoking at 17 until she was "54 something".  She lives in [place].  The patient is alone today.  Allergies: No Known Allergies  Current Medications: Current Outpatient Prescriptions  Medication Sig Dispense Refill  . albuterol (PROAIR HFA) 108 (90 BASE) MCG/ACT inhaler Inhale 2 puffs into the lungs every 4 (four) hours as needed for wheezing or shortness of breath.     Marland Kitchen albuterol (PROVENTIL) (2.5  MG/3ML) 0.083% nebulizer solution Take 2.5 mg by nebulization every 6 (six) hours as needed for wheezing.     Marland Kitchen amLODipine (NORVASC) 10 MG tablet Take 10 mg by mouth every morning.     Marland Kitchen aspirin EC 81 MG tablet Take 1 tablet (81 mg total) by mouth daily. 150 tablet 2  . buPROPion (WELLBUTRIN) 75 MG tablet Take 75 mg by mouth 2 (two) times daily.    . clopidogrel (PLAVIX) 75 MG tablet Take 1 tablet (75 mg total) by mouth daily. 30 tablet 11  . gabapentin (NEURONTIN) 100 MG capsule Take 100 mg by mouth 2 (two) times daily.    Marland Kitchen lisinopril-hydrochlorothiazide (PRINZIDE,ZESTORETIC) 10-12.5 MG per tablet Take 1 tablet by mouth every morning.    . meloxicam (MOBIC) 7.5 MG tablet Take 7.5 mg by mouth 2 (two) times daily.    . mometasone-formoterol (DULERA) 200-5 MCG/ACT AERO Inhale 1 puff into the lungs 2 (two) times daily.    . OXYGEN Inhale 2 L into the lungs at bedtime.    . potassium chloride (K-DUR) 10 MEQ tablet Take 10 mEq by mouth every other day. Instructed to take 38mq daily from 9/6 - 9/11 then resume taking 114m every other day.    . Marland KitchenOPINIRole (REQUIP) 0.5 MG tablet Take 0.5 mg by mouth 2 (two) times daily.     . rosuvastatin (CRESTOR) 10 MG tablet Take 10 mg by mouth at bedtime.     . SitaGLIPtin-MetFORMIN HCl (JANUMET XR) 50-500 MG TB24 Take 1 tablet by mouth daily with breakfast.     . tiotropium (SPIRIVA HANDIHALER) 18 MCG inhalation capsule Place 18 mcg into inhaler and inhale daily.     No current facility-administered medications for this visit.     Review of Systems:  GENERAL:  Feels "good".  No fevers or sweats.  Weight loss of 3 pounds. PERFORMANCE STATUS (ECOG):  1 HEENT:  No visual changes, runny nose, sore throat, mouth sores or tenderness. Lungs: No shortness of breath or cough.  No hemoptysis. Cardiac:  No chest pain, palpitations, orthopnea, or PND. GI:  Right lower side pain, resolved.  No nausea, vomiting, diarrhea, constipation, melena or hematochezia.  Prior  colonoscopy. GU:  No urgency, frequency, dysuria, or hematuria. Musculoskeletal:  No back pain.  No joint pain.  No muscle tenderness. Extremities:  No pain or swelling. Skin:  No rashes or skin changes. Neuro:  No headache, numbness or weakness, balance or coordination issues. Endocrine:  Diabetes.  No thyroid issues, hot flashes or night sweats. Psych:  No mood changes, depression or anxiety. Pain:  No focal pain. Review of systems:  All other systems reviewed and found to be negative.  Physical Exam: Blood pressure 104/74, pulse 95, temperature 98.6 F (37 C), temperature source Tympanic, resp. rate 18, height _0  (1.499 m), weight 113 lb (51.3 kg). GENERAL:  Thin woman sitting comfortably in the exam room in no acute distress. MENTAL STATUS:  Alert and oriented to person, place and time.  HEAD:  Shoulder length gray hair pulled back.  Normocephalic, atraumatic, face symmetric, no Cushingoid features. EYES:  Brown eyes.  Pupils equal round and reactive to light and accomodation.  No conjunctivitis or scleral icterus. ENT:  Oropharynx clear without lesion.  Edentulous. Tongue normal. Mucous membranes moist.  RESPIRATORY:  Clear to auscultation without rales, wheezes or rhonchi. CARDIOVASCULAR:  Regular rate and rhythm without murmur, rub or gallop. ABDOMEN:  Soft, non-tender, with active bowel sounds, and no hepatosplenomegaly.  No masses. SKIN:  No rashes, ulcers or lesions. EXTREMITIES: No edema, no skin discoloration or tenderness.  No palpable cords. LYMPH NODES: No palpable cervical, supraclavicular, axillary or inguinal adenopathy  NEUROLOGICAL: Unremarkable. PSYCH:  Appropriate.   Appointment on 02/04/2017  Component Date Value Ref Range Status  . WBC 02/04/2017 4.9  3.6 - 11.0 K/uL Final  . RBC 02/04/2017 4.18  3.80 - 5.20 MIL/uL Final  . Hemoglobin 02/04/2017 12.5  12.0 - 16.0 g/dL Final  . HCT 02/04/2017 36.6  35.0 - 47.0 % Final  . MCV 02/04/2017 87.6  80.0 - 100.0  fL Final  . MCH 02/04/2017 29.8  26.0 - 34.0 pg Final  . MCHC 02/04/2017 34.0  32.0 - 36.0 g/dL Final  . RDW 02/04/2017 13.3  11.5 - 14.5 % Final  . Platelets 02/04/2017 235  150 - 440 K/uL Final  . Neutrophils Relative % 02/04/2017 61  % Final  . Neutro Abs 02/04/2017 3.1  1.4 - 6.5 K/uL Final  . Lymphocytes Relative 02/04/2017 27  % Final  . Lymphs Abs 02/04/2017 1.3  1.0 - 3.6 K/uL Final  . Monocytes Relative 02/04/2017 9  % Final  . Monocytes Absolute 02/04/2017 0.4  0.2 - 0.9 K/uL Final  . Eosinophils Relative 02/04/2017 2  % Final  . Eosinophils Absolute 02/04/2017 0.1  0 - 0.7 K/uL Final  . Basophils Relative 02/04/2017 1  % Final  . Basophils Absolute 02/04/2017 0.0  0 - 0.1 K/uL Final  . Sodium 02/04/2017 133* 135 - 145 mmol/L Final  . Potassium 02/04/2017 3.8  3.5 - 5.1 mmol/L Final  . Chloride 02/04/2017 97* 101 - 111 mmol/L Final  . CO2 02/04/2017 27  22 - 32 mmol/L Final  . Glucose, Bld 02/04/2017 85  65 - 99 mg/dL Final  . BUN 02/04/2017 16  6 - 20 mg/dL Final  . Creatinine, Ser 02/04/2017 0.86  0.44 - 1.00 mg/dL Final  . Calcium 02/04/2017 9.3  8.9 - 10.3 mg/dL Final  . Total Protein 02/04/2017 7.7  6.5 - 8.1 g/dL Final  . Albumin 02/04/2017 4.4  3.5 - 5.0 g/dL Final  . AST 02/04/2017 25  15 - 41 U/L Final  . ALT 02/04/2017 16  14 - 54 U/L Final  . Alkaline Phosphatase 02/04/2017 57  38 - 126 U/L Final  . Total Bilirubin 02/04/2017 0.6  0.3 - 1.2 mg/dL Final  . GFR calc non Af Amer 02/04/2017 >60  >60 mL/min Final  . GFR calc Af Amer 02/04/2017 >60  >60 mL/min Final   Comment: (NOTE) The eGFR has been calculated using the CKD EPI equation. This calculation has not been validated in all clinical situations. eGFR's persistently <60 mL/min signify possible Chronic Kidney Disease.   . Anion gap 02/04/2017 9  5 - 15 Final    Assessment:  MAGARET JUSTO is a 61 y.o. 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   She was  not  a good surgery candidate secondary to her lung function (FEV1 28%).  Chest CT on 07/31/2011 revealed a 1.1 x 0.6 cm RUL nodule and a 9 mm inferior aspect RUL nodule and a 4 mm nodule in the anterior aspect of the inferior aspect of the RUL.  PET scan on 08/03/2011 revealed a 10-mm spiculated right apical pulmonary nodule (SUV 2.5) suspcious for lung cancer. Left ovary was prominent (3.9 x 2.4 cm).   CA-125 was 9.3 (normal).  CT guided biopsy of the superior and inferior RUL nodules on 12/30/2011 revealed well differentiated adenocarcinoma of lung primary.  EGFR testing revealed no mutation.  She received IMRT 6000 cGy to both nodules from 01/26/2012 - 03/10/2012.  PET scan on 01/30/2015 revealed a hypermetabolic and enlarging RUL pulmonary nodules concerning for lung cancer recurrence.  There was a small focus of peripheral nodular consolidation in the superior segment of the right lower lobe with differential including inflammation/infection versus neoplasm.  There was no evidence of mediastinal nodal metastasis.    PET scan on 03/27/2016 revealed no hypermetabolic activity to suggest recurrent malignancy. There was a soft tissue density in the left pelvis near the inguinal ring that was not hypermetabolic, but prominent and probably separate from the left ovary. This could be a wandering fibroid.  Although the overall appearance had not changed from 2012 and was not hypermetabolic, pelvic sonography or pelvic MRI for further characterization of the uterus and ovaries can be considered.  In 04/2015, there was an attempt at radiofrequency ablation of 1 enlarging RUL nodule.  At the time of the procedure, the nodule had shrunk to 5 mm from 9 mm.  Chest CT on 08/01/2015 revealed complete resolution of the prior nodule in the right lung apex.  Chest CT on 01/31/2016 revealed no active cardiopulmonary abnormalities.  There was stable appearance of right apical and right middle lobe scar like densities.   Low dose chest CT on 02/02/2017 revealed  new ground-glass nodule in the right lower lobe.  She has an elevated CEA of unclear etiology.  CEA was 8.1 on 01/31/2016 and 9.2 on 03/03/2016.  Colonoscopy on 06/25/2015 revealed a 4 mm polyp in the sigmoid colon.  Pathology revealed a tubular adenoma without evidence of dysplasia or malignancy.  Pelvic MRI on 04/30/2016 revealed a 3.4 x 2.5 cm ovoid mass in the LEFT adnexa with signal characteristics identical to the intramural leiomyomas and consistent with an exophytic leiomyoma.  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  Endometrium was normal with a normal junctional zone.  Ovaries were not identified.  Symptomatically, she feels good.  She is more active.  Exam is unremarkable.   Plan: 1.  Labs today:  CBC with diff, CMP. 2.  Discuss interval low dose chest CT.  Unclear significance of new ground glass nodule in RLL.  Discuss plans for follow-up chest CT in 6 months (08/04/2017). 3.  Schedule low dose chest CT on 08/04/2017. 4.  RTC after CT for MD assessment.    Lequita Asal, MD  02/04/2017, 4:42 PM

## 2017-02-04 NOTE — Progress Notes (Signed)
Patient here for h/o lung cancer. Patient has no medical complaints. She is here to discuss her ct scan results.

## 2017-05-12 ENCOUNTER — Encounter (INDEPENDENT_AMBULATORY_CARE_PROVIDER_SITE_OTHER): Payer: Medicaid Other

## 2017-05-12 ENCOUNTER — Ambulatory Visit (INDEPENDENT_AMBULATORY_CARE_PROVIDER_SITE_OTHER): Payer: Medicaid Other | Admitting: Vascular Surgery

## 2017-05-17 ENCOUNTER — Other Ambulatory Visit (INDEPENDENT_AMBULATORY_CARE_PROVIDER_SITE_OTHER): Payer: Self-pay | Admitting: Vascular Surgery

## 2017-06-16 ENCOUNTER — Encounter (INDEPENDENT_AMBULATORY_CARE_PROVIDER_SITE_OTHER): Payer: Self-pay | Admitting: Vascular Surgery

## 2017-06-16 ENCOUNTER — Ambulatory Visit (INDEPENDENT_AMBULATORY_CARE_PROVIDER_SITE_OTHER): Payer: Medicaid Other | Admitting: Vascular Surgery

## 2017-06-16 ENCOUNTER — Encounter (INDEPENDENT_AMBULATORY_CARE_PROVIDER_SITE_OTHER): Payer: Medicaid Other

## 2017-06-16 VITALS — BP 120/84 | HR 113 | Resp 17 | Ht 59.0 in | Wt 103.0 lb

## 2017-06-16 DIAGNOSIS — I70213 Atherosclerosis of native arteries of extremities with intermittent claudication, bilateral legs: Secondary | ICD-10-CM | POA: Diagnosis not present

## 2017-06-16 DIAGNOSIS — E118 Type 2 diabetes mellitus with unspecified complications: Secondary | ICD-10-CM

## 2017-06-16 DIAGNOSIS — E785 Hyperlipidemia, unspecified: Secondary | ICD-10-CM

## 2017-06-16 NOTE — Progress Notes (Signed)
Subjective:    [name], female    DOB: 1955-11-03, 61 y.o.   MRN: [id number] Chief Complaint  Patient presents with  . Follow-up    6 month follow up   Patient presents for a 6 month peripheral artery disease follow-up. We had scheduled the patient for an ABI and arterial duplex however her insurance refused it. The patient presents today with intermittent thigh claudication with activity. At this time, it is not lifestyle limiting. The patient denies any rest pain or ulceration to the lower extremity. The patient denies any fever, nausea or vomiting.   Review of Systems  Constitutional: Negative.   HENT: Negative.   Eyes: Negative.   Respiratory: Negative.   Cardiovascular:       Bilateral thigh claudication  Gastrointestinal: Negative.   Endocrine: Negative.   Genitourinary: Negative.   Musculoskeletal: Negative.   Skin: Negative.   Allergic/Immunologic: Negative.   Neurological: Negative.   Hematological: Negative.   Psychiatric/Behavioral: Negative.       Objective:   Physical Exam  Constitutional: She is oriented to person, place, and time. She appears well-developed and well-nourished. No distress.  HENT:  Head: Normocephalic and atraumatic.  Eyes: Pupils are equal, round, and reactive to light. Conjunctivae are normal.  Neck: Normal range of motion.  Cardiovascular: Normal rate, regular rhythm, normal heart sounds and intact distal pulses.   Pulses:      Radial pulses are 2+ on the right side, and 2+ on the left side.       Dorsalis pedis pulses are 1+ on the right side, and 1+ on the left side.       Posterior tibial pulses are 1+ on the right side, and 1+ on the left side.  Pulmonary/Chest: Effort normal.  Musculoskeletal: Normal range of motion. She exhibits no edema.  Neurological: She is alert and oriented to person, place, and time.  Skin: Skin is warm and dry. She is not diaphoretic.  Psychiatric: She has a normal mood and affect. Her  behavior is normal. Judgment and thought content normal.  Vitals reviewed.  BP 120/84 (BP Location: Right Arm)   Pulse (!) 113   Resp 17   Ht 4\' 11"  (1.499 m)   Wt 103 lb (46.7 kg)   BMI 20.80 kg/m   Past Medical History:  Diagnosis Date  . Arthritis   . Asthma   . Cancer [facility]) 2012   Right Lung CA  . COPD (chronic obstructive pulmonary disease) ([place])   . Depression   . Diabetes mellitus without complication [facility])    Patient takes Janumet  . Hypercholesteremia   . Hypertension   . 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 Peripheral vascular disease ([place])   . Shortness of breath dyspnea    with exertion    Social History   Social History  . Marital status: Widowed    Spouse name: N/A  . Number of children: N/A  . Years of education: N/A   Occupational History  . Not on file.   Social History Main Topics  . Smoking status: Former Smoker    Packs/day: 0.90    Years: 37.00    Types: Cigarettes    Quit date: 02/06/2010  . Smokeless tobacco: Former Systems developer    Types: Snuff  . Alcohol use No     Comment: hx of etoh use   . Drug use: Yes    Types: Marijuana     Comment:  hx of cocaine use- last use 2015   . Sexual activity: Not on file   Other Topics Concern  . Not on file   Social History Narrative  . No narrative on file   Past Surgical History:  Procedure Laterality Date  . CESAREAN SECTION     x3  . COLONOSCOPY WITH PROPOFOL N/A 06/25/2015   Procedure: COLONOSCOPY WITH PROPOFOL;  Surgeon: Lucilla Lame, MD;  Location: [facility] ENDOSCOPY;  Service: Endoscopy;  Laterality: N/A;  . CYST REMOVAL LEG     and on shoulder   . LUNG BIOPSY  05 15 2013   has lung "spots"  . PERIPHERAL VASCULAR CATHETERIZATION Left 06/01/2016   Procedure: Lower Extremity Angiography;  Surgeon: Algernon Huxley, MD;  Location: [place] [facility];  Service: Cardiovascular;  Laterality: Left;  . PERIPHERAL VASCULAR CATHETERIZATION N/A 06/01/2016   Procedure: Abdominal Aortogram w/Lower Extremity;   Surgeon: Algernon Huxley, MD;  Location: [place] [facility];  Service: Cardiovascular;  Laterality: N/A;  . PERIPHERAL VASCULAR CATHETERIZATION  06/01/2016   Procedure: Lower Extremity Intervention;  Surgeon: Algernon Huxley, MD;  Location: [place] [facility];  Service: Cardiovascular;;  . PERIPHERAL VASCULAR CATHETERIZATION Right 06/08/2016   Procedure: Lower Extremity Angiography;  Surgeon: Algernon Huxley, MD;  Location: [place] [facility];  Service: Cardiovascular;  Laterality: Right;  . PERIPHERAL VASCULAR CATHETERIZATION  06/08/2016   Procedure: Lower Extremity Intervention;  Surgeon: Algernon Huxley, MD;  Location: [place] [facility];  Service: Cardiovascular;;   Family History  Problem Relation Age of Onset  . Lung cancer Father   . Diabetes Mother   . Diabetes Sister   . Diabetes Maternal Grandmother   . Diabetes Paternal Grandmother   . Diabetes Sister    No Known Allergies    Encouraged good control as its slows the progression of atherosclerotic disease Assessment & Plan:  Patient presents for a 6 month peripheral artery disease follow-up. We had scheduled the patient for an ABI and arterial duplex however her insurance refused it. The patient presents today with intermittent thigh claudication with activity. At this time, it is not lifestyle limiting. The patient denies any rest pain or ulceration to the lower extremity. The patient denies any fever, nausea or vomiting.  1. Atherosclerosis of native artery of both lower extremities with intermittent claudication ([place]) - Stable Patient with a history of peripheral artery disease Patient with intermittent thigh claudication with activity This is not lifestyle limiting at this time. Patient with faint pedal pulses bilaterally on exam I will bring the patient back in 3 months as per her insurance requirements to undergo an ABI to assess her arterial blood flow The patient is to call sooner if her claudication should worsen Patient  expresses her understanding - VAS [name] ABI WITH/WO TBI; Future  2. Type 2 diabetes mellitus with complication, unspecified whether long term insulin use (HCC) - Stable Encouraged good control as its slows the progression of atherosclerotic disease  3. Hyperlipidemia, unspecified hyperlipidemia type - Stable Encouraged good control as its slows the progression of atherosclerotic disease  Current Outpatient Prescriptions on File Prior to Visit  Medication Sig Dispense Refill  . albuterol (PROAIR HFA) 108 (90 BASE) MCG/ACT inhaler Inhale 2 puffs into the lungs every 4 (four) hours as needed for wheezing or shortness of breath.     Marland Kitchen albuterol (PROVENTIL) (2.5 MG/3ML) 0.083% nebulizer solution Take 2.5 mg by nebulization every 6 (six) hours as needed for wheezing.     Marland Kitchen  amLODipine (NORVASC) 10 MG tablet Take 10 mg by mouth every morning.     Marland Kitchen aspirin EC 81 MG tablet Take 1 tablet (81 mg total) by mouth daily. 150 tablet 2  . buPROPion (WELLBUTRIN) 75 MG tablet Take 75 mg by mouth 2 (two) times daily.    . clopidogrel (PLAVIX) 75 MG tablet TAKE ONE TABLET BY MOUTH ONCE DAILY 30 tablet 11  . gabapentin (NEURONTIN) 100 MG capsule Take 100 mg by mouth 2 (two) times daily.    Marland Kitchen lisinopril-hydrochlorothiazide (PRINZIDE,ZESTORETIC) 10-12.5 MG per tablet Take 1 tablet by mouth every morning.    . meloxicam (MOBIC) 7.5 MG tablet Take 7.5 mg by mouth 2 (two) times daily.    . mometasone-formoterol (DULERA) 200-5 MCG/ACT AERO Inhale 1 puff into the lungs 2 (two) times daily.    . OXYGEN Inhale 2 L into the lungs at bedtime.    . potassium chloride (K-DUR) 10 MEQ tablet Take 10 mEq by mouth every other day. Instructed to take 98meq daily from 9/6 - 9/11 then resume taking 77meq every other day.    Marland Kitchen rOPINIRole (REQUIP) 0.5 MG tablet Take 0.5 mg by mouth 2 (two) times daily.     . rosuvastatin (CRESTOR) 10 MG tablet Take 10 mg by mouth at bedtime.     . SitaGLIPtin-MetFORMIN HCl (JANUMET XR) 50-500 MG TB24  Take 1 tablet by mouth daily with breakfast.     . tiotropium (SPIRIVA HANDIHALER) 18 MCG inhalation capsule Place 18 mcg into inhaler and inhale daily.     No current facility-administered medications on file prior to visit.    There are no Patient Instructions on file for this visit. No Follow-up on file.  Shaka Cardin A Crimson Dubberly, PA-C

## 2017-07-29 ENCOUNTER — Ambulatory Visit
Admission: RE | Admit: 2017-07-29 | Discharge: 2017-07-29 | Disposition: A | Discharge status: Home / Self Care | Payer: Medicaid Other | Source: Ambulatory Visit | Attending: Hematology and Oncology | Admitting: Hematology and Oncology

## 2017-07-29 DIAGNOSIS — C3411 Malignant neoplasm of upper lobe, right bronchus or lung: Secondary | ICD-10-CM | POA: Insufficient documentation

## 2017-07-29 DIAGNOSIS — I2510 Atherosclerotic heart disease of native coronary artery without angina pectoris: Secondary | ICD-10-CM | POA: Diagnosis not present

## 2017-07-29 DIAGNOSIS — J439 Emphysema, unspecified: Secondary | ICD-10-CM | POA: Diagnosis not present

## 2017-07-29 DIAGNOSIS — I700 Atherosclerosis of aorta: Secondary | ICD-10-CM | POA: Diagnosis not present

## 2017-08-04 ENCOUNTER — Other Ambulatory Visit: Payer: Self-pay | Admitting: Urgent Care

## 2017-08-04 ENCOUNTER — Ambulatory Visit: Payer: Medicaid Other

## 2017-08-04 DIAGNOSIS — C3491 Malignant neoplasm of unspecified part of right bronchus or lung: Secondary | ICD-10-CM

## 2017-08-04 DIAGNOSIS — R970 Elevated carcinoembryonic antigen [CEA]: Secondary | ICD-10-CM

## 2017-08-04 NOTE — Progress Notes (Signed)
[facility] day:  08/05/2017  Chief Complaint: Angela Jensen is a 61 y.o. female with a history of stage I lung cancer who is seen for 6 month assessment to review interval imaging.   HPI: The patient was last seen in the medical oncology clinic on 02/04/2017.  At that time, patient was doing well overall. She denies any physical complaints. Exam stable. Discussed repeat lung CT to assess new ground-glass nodule in the RIGHT lower lobe to be done in 07/2017. Labs were unremarkable.   Repeat low dose chest CT was done on 07/29/2017 that demonstrated enlargement of the previously observed sub-solid pulmonary lesion. RIGHT upper lobe lesion measured 1.4 x 2.1 x 1.6 cm (previously 1.5 x 1.1 x 1.7 cm). There was also focal architectural distortion in the RIGHT middle and upper lobes that was felt to be most compatible with areas of chronic post infectious or inflammatory scarring. Aortic value calcifications were noted. Lung-RADS 4BS (suspicious). Additional imaging evaluation or consultation with pulmonology and/or thoracic surgery recommended.   Patient to be discussed at multidisciplinary tumor board later today.   Symptomatically,  Patient is doing well. She denied any acute physical concerns today. She notes that her breathing is stable. Patient continues to experience exertional dyspnea. She states "I feel like something is balled in the chest sometimes". Patient denies any B symptoms. She notes that her appetite has improved. Patient has gained 12 pounds since her last visit on 06/16/2017. She does have some lower extremity edema noted. Patient wearing knee-high compression stockings in the clinic today.  Patient denies pain; pain level 0/10.  Past Medical History:  Diagnosis Date  . Arthritis   . Asthma   . Cancer [facility]) 2012   Right Lung CA  . COPD (chronic obstructive pulmonary disease) ([place])   . Depression   . Diabetes mellitus without  complication [facility])    Patient takes Janumet  . Hypercholesteremia   . Hypertension   . 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 Peripheral vascular disease ([place])   . Shortness of breath dyspnea    with exertion     Past Surgical History:  Procedure Laterality Date  . CESAREAN SECTION     x3  . COLONOSCOPY WITH PROPOFOL N/A 06/25/2015   Procedure: COLONOSCOPY WITH PROPOFOL;  Surgeon: Lucilla Lame, MD;  Location: [facility] ENDOSCOPY;  Service: Endoscopy;  Laterality: N/A;  . CYST REMOVAL LEG     and on shoulder   . LUNG BIOPSY  05 15 2013   has lung "spots"  . PERIPHERAL VASCULAR CATHETERIZATION Left 06/01/2016   Procedure: Lower Extremity Angiography;  Surgeon: Algernon Huxley, MD;  Location: [place] [facility];  Service: Cardiovascular;  Laterality: Left;  . PERIPHERAL VASCULAR CATHETERIZATION N/A 06/01/2016   Procedure: Abdominal Aortogram w/Lower Extremity;  Surgeon: Algernon Huxley, MD;  Location: [place] [facility] LAB;  Service: Cardiovascular;  Laterality: N/A;  . PERIPHERAL VASCULAR CATHETERIZATION  06/01/2016   Procedure: Lower Extremity Intervention;  Surgeon: Algernon Huxley, MD;  Location: [place] [facility] LAB;  Service: Cardiovascular;;  . PERIPHERAL VASCULAR CATHETERIZATION Right 06/08/2016   Procedure: Lower Extremity Angiography;  Surgeon: Algernon Huxley, MD;  Location: [place] [facility];  Service: Cardiovascular;  Laterality: Right;  . PERIPHERAL VASCULAR CATHETERIZATION  06/08/2016   Procedure: Lower Extremity Intervention;  Surgeon: Algernon Huxley, MD;  Location: [place] [facility] LAB;  Service: Cardiovascular;;    Family History  Problem Relation Age of Onset  . Lung cancer Father   . Diabetes Mother   . Diabetes Sister   . Diabetes Maternal Grandmother   . Diabetes Paternal Grandmother   . Diabetes Sister     Social History:  reports that she quit smoking about 7 years ago. Her smoking use included cigarettes. She has a 33.30 pack-year smoking history. She has quit using smokeless  tobacco. Her smokeless tobacco use included snuff. She reports that she uses drugs. Drug: Marijuana. She reports that she does not drink alcohol.  She has not smoked for 1 month.  She started smoking at 17 until she was "53 something".  She lives in [place].  The patient is alone today.  Allergies: No Known Allergies  Current Medications: Current Outpatient Medications  Medication Sig Dispense Refill  . albuterol (PROAIR HFA) 108 (90 BASE) MCG/ACT inhaler Inhale 2 puffs into the lungs every 4 (four) hours as needed for wheezing or shortness of breath.     Marland Kitchen albuterol (PROVENTIL) (2.5 MG/3ML) 0.083% nebulizer solution Take 2.5 mg by nebulization every 6 (six) hours as needed for wheezing.     Marland Kitchen amLODipine (NORVASC) 10 MG tablet Take 10 mg by mouth every morning.     Marland Kitchen aspirin EC 81 MG tablet Take 1 tablet (81 mg total) by mouth daily. 150 tablet 2  . buPROPion (WELLBUTRIN) 75 MG tablet Take 75 mg by mouth 2 (two) times daily.    . clopidogrel (PLAVIX) 75 MG tablet TAKE ONE TABLET BY MOUTH ONCE DAILY 30 tablet 11  . gabapentin (NEURONTIN) 100 MG capsule Take 100 mg by mouth 2 (two) times daily.    Marland Kitchen lisinopril-hydrochlorothiazide (PRINZIDE,ZESTORETIC) 10-12.5 MG per tablet Take 1 tablet by mouth every morning.    . meloxicam (MOBIC) 7.5 MG tablet Take 7.5 mg by mouth 2 (two) times daily.    . mometasone-formoterol (DULERA) 200-5 MCG/ACT AERO Inhale 1 puff into the lungs 2 (two) times daily.    . OXYGEN Inhale 2 L into the lungs at bedtime.    . potassium chloride (K-DUR) 10 MEQ tablet Take 10 mEq by mouth every other day. Instructed to take 15mq daily from 9/6 - 9/11 then resume taking 141m every other day.    . Marland KitchenOPINIRole (REQUIP) 0.5 MG tablet Take 0.5 mg by mouth 2 (two) times daily.     . rosuvastatin (CRESTOR) 10 MG tablet Take 10 mg by mouth at bedtime.     . SitaGLIPtin-MetFORMIN HCl (JANUMET XR) 50-500 MG TB24 Take 1 tablet by mouth daily with breakfast.     . tiotropium (SPIRIVA  HANDIHALER) 18 MCG inhalation capsule Place 18 mcg into inhaler and inhale daily.     No current facility-administered medications for this visit.     Review of Systems:  GENERAL:  Feels "good".  No fevers or sweats.  Weight up 12 pounds. PERFORMANCE STATUS (ECOG):  1 HEENT:  No visual changes, runny nose, sore throat, mouth sores or tenderness. Lungs: Exertional dyspnea. No increased shortness of breath or cough.  No hemoptysis. Cardiac:  No chest pain, palpitations, orthopnea, or PND. GI:   No nausea, vomiting, diarrhea, constipation, melena or hematochezia.  Prior colonoscopy. GU:  No urgency, frequency, dysuria, or hematuria. Musculoskeletal:  No back pain.  No joint pain.  No muscle tenderness. Extremities:  No pain or swelling. Skin:  No rashes or skin changes. Neuro:  No headache, numbness or weakness, balance or coordination issues. Endocrine:  Diabetes.  No thyroid issues, hot  flashes or night sweats. Psych:  No mood changes, depression or anxiety. Pain:  No focal pain. Review of systems:  All other systems reviewed and found to be negative.  Physical Exam: Blood pressure 119/80, pulse 95, temperature 97.7 F (36.5 C), temperature source Tympanic, resp. rate 20, weight 115 lb 4 oz (52.3 kg). GENERAL:  Thin woman sitting comfortably in the exam room in no acute distress. MENTAL STATUS:  Alert and oriented to person, place and time. HEAD:  Shoulder length Antionne Enrique hair pulled back.  Normocephalic, atraumatic, face symmetric, no Cushingoid features. EYES:  Brown eyes.  Pupils equal round and reactive to light and accomodation.  No conjunctivitis or scleral icterus. ENT:  Oropharynx clear without lesion.  Edentulous. Tongue normal. Mucous membranes moist.  RESPIRATORY:  Clear to auscultation without rales, wheezes or rhonchi. CARDIOVASCULAR:  Regular rate and rhythm without murmur, rub or gallop. ABDOMEN:  Soft, non-tender, with active bowel sounds, and no hepatosplenomegaly.  No  masses. SKIN:  No rashes, ulcers or lesions. EXTREMITIES: 1+ non-pitting edema to bilateral lower extremities; wearing TED hose. No skin discoloration or tenderness.  No palpable cords. LYMPH NODES: No palpable cervical, supraclavicular, axillary or inguinal adenopathy  NEUROLOGICAL: Unremarkable. PSYCH:  Appropriate.   No visits with results within 3 Day(s) from this visit.  Latest known visit with results is:  Appointment on 02/04/2017  Component Date Value Ref Range Status  . WBC 02/04/2017 4.9  3.6 - 11.0 K/uL Final  . RBC 02/04/2017 4.18  3.80 - 5.20 MIL/uL Final  . Hemoglobin 02/04/2017 12.5  12.0 - 16.0 g/dL Final  . HCT 02/04/2017 36.6  35.0 - 47.0 % Final  . MCV 02/04/2017 87.6  80.0 - 100.0 fL Final  . MCH 02/04/2017 29.8  26.0 - 34.0 pg Final  . MCHC 02/04/2017 34.0  32.0 - 36.0 g/dL Final  . RDW 02/04/2017 13.3  11.5 - 14.5 % Final  . Platelets 02/04/2017 235  150 - 440 K/uL Final  . Neutrophils Relative % 02/04/2017 61  % Final  . Neutro Abs 02/04/2017 3.1  1.4 - 6.5 K/uL Final  . Lymphocytes Relative 02/04/2017 27  % Final  . Lymphs Abs 02/04/2017 1.3  1.0 - 3.6 K/uL Final  . Monocytes Relative 02/04/2017 9  % Final  . Monocytes Absolute 02/04/2017 0.4  0.2 - 0.9 K/uL Final  . Eosinophils Relative 02/04/2017 2  % Final  . Eosinophils Absolute 02/04/2017 0.1  0 - 0.7 K/uL Final  . Basophils Relative 02/04/2017 1  % Final  . Basophils Absolute 02/04/2017 0.0  0 - 0.1 K/uL Final  . Sodium 02/04/2017 133* 135 - 145 mmol/L Final  . Potassium 02/04/2017 3.8  3.5 - 5.1 mmol/L Final  . Chloride 02/04/2017 97* 101 - 111 mmol/L Final  . CO2 02/04/2017 27  22 - 32 mmol/L Final  . Glucose, Bld 02/04/2017 85  65 - 99 mg/dL Final  . BUN 02/04/2017 16  6 - 20 mg/dL Final  . Creatinine, Ser 02/04/2017 0.86  0.44 - 1.00 mg/dL Final  . Calcium 02/04/2017 9.3  8.9 - 10.3 mg/dL Final  . Total Protein 02/04/2017 7.7  6.5 - 8.1 g/dL Final  . Albumin 02/04/2017 4.4  3.5 - 5.0 g/dL  Final  . AST 02/04/2017 25  15 - 41 U/L Final  . ALT 02/04/2017 16  14 - 54 U/L Final  . Alkaline Phosphatase 02/04/2017 57  38 - 126 U/L Final  . Total Bilirubin 02/04/2017 0.6  0.3 -  1.2 mg/dL Final  . GFR calc non Af Amer 02/04/2017 >60  >60 mL/min Final  . GFR calc Af Amer 02/04/2017 >60  >60 mL/min Final   Comment: (NOTE) The eGFR has been calculated using the CKD EPI equation. This calculation has not been validated in all clinical situations. eGFR's persistently <60 mL/min signify possible Chronic Kidney Disease.   . Anion gap 02/04/2017 9  5 - 15 Final    Assessment:  GELILA WELL is a 61 y.o. 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   She was not a good surgery candidate secondary to her lung function (FEV1 28%).  Chest CT on 07/31/2011 revealed a 1.1 x 0.6 cm RUL nodule and a 9 mm inferior aspect RUL nodule and a 4 mm nodule in the anterior aspect of the inferior aspect of the RUL.  PET scan on 08/03/2011 revealed a 10-mm spiculated right apical pulmonary nodule (SUV 2.5) suspcious for lung cancer. Left ovary was prominent (3.9 x 2.4 cm).   CA-125 was 9.3 (normal).  CT guided biopsy of the superior and inferior RUL nodules on 12/30/2011 revealed well differentiated adenocarcinoma of lung primary.  EGFR testing revealed no mutation.  She received IMRT 6000 cGy to both nodules from 01/26/2012 - 03/10/2012.  PET scan on 01/30/2015 revealed a hypermetabolic and enlarging RUL pulmonary nodules concerning for lung cancer recurrence.  There was a small focus of peripheral nodular consolidation in the superior segment of the right lower lobe with differential including inflammation/infection versus neoplasm.  There was no evidence of mediastinal nodal metastasis.    PET scan on 03/27/2016 revealed no hypermetabolic activity to suggest recurrent malignancy. There was a soft tissue density in the left pelvis near the inguinal ring that was  not hypermetabolic, but prominent and probably separate from the left ovary. This could be a wandering fibroid.  Although the overall appearance had not changed from 2012 and was not hypermetabolic, pelvic sonography or pelvic MRI for further characterization of the uterus and ovaries can be considered.  In 04/2015, there was an attempt at radiofrequency ablation of 1 enlarging RUL nodule.  At the time of the procedure, the nodule had shrunk to 5 mm from 9 mm.   Chest CT on 08/01/2015 revealed complete resolution of the prior nodule in the right lung apex.   Chest CT on 01/31/2016 revealed no active cardiopulmonary abnormalities.  There was stable appearance of right apical and right middle lobe scar like densities.   Low dose chest CT on 02/02/2017 revealed new ground-glass nodule in the right lower lobe. Lung-RADS 3 (probably benign findings).   Low dose chest nodule follow up  on 07/29/2017 demonstrated enlargement of the previously observed sub-solid pulmonary lesion. RIGHT upper lobe lesion measured 1.4 x 2.1 x 1.6 cm (previously 1.5 x 1.1 x 1.7 cm). There was also areas of focal architectural distortion in the RIGHT upper and middle lobes that were felt to be most compatible with areas of chronic post infectious or inflammatory scarring. Aortic value calcifications were noted. Lung-RADS 4BS (suspicious).   She has an elevated CEA of unclear etiology.  CEA was 8.1 on 01/31/2016 and 9.2 on 03/03/2016.  Colonoscopy on 06/25/2015 revealed a 4 mm polyp in the sigmoid colon.  Pathology revealed a tubular adenoma without evidence of dysplasia or malignancy.  Pelvic MRI on 04/30/2016 revealed a 3.4 x 2.5 cm ovoid mass in the LEFT adnexa with signal characteristics identical to  the intramural leiomyomas and consistent with an exophytic leiomyoma.  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  Endometrium was normal with a normal junctional zone.  Ovaries were not identified.  Symptomatically, she feels "pretty  good". Patient experienced exertional dyspnea. She has 1+ non-pitting edema in her lower extremities; TED hose in place. 12 pound weight increase since 06/16/2017. No new labs today.    Plan: 1.  Labs today:  CBC with diff, CMP, CEA 2.  Discuss interval low dose chest CT.  Previous mass noted in 01/2017 has enlarged. Discuss need for CT versus US guided tissue biopsy required for diagnosis and subsequent staging. Patient to be discussed at multidisciplinary tumor board later today for recommendations. Will call patient to discuss plan after tumor board.  3.  Discuss cardiac concerns (aortic calcifications) noted on resent CT. Echocardiogram recommended to assess for valvular dysfunction. Spoke with Leretha Pol, FNP at [facility] who advised that patient has never had an echo through her office. She advised that patient sees Dr. Nehemiah Massed, however there is no evidence of this available for review in care everywhere. Patient denies ever being seen by cardiology. Given patient's past medical history of HTN, DM, HLD, PVD, and cancer she is at increased risk for MI and CVA. Patient has increased shortness of breath with exertion and bilateral lower extremity edema. Dicussed recommended echocardiogram. Patient wishes to proceed. If echo is abnormal, will refer to cardiology for further evaluation.  4.  We plan on calling patient for RTC appointment after patient is discussed at tumor board today.   ADDENDUM: Dr. Kathlene Cote advised that a biopsy is not the best approach for this patient. Recommendations were to consult CVTS to see if a wedge resection is possible. Of note, she was  previously deemed poor surgical candidate with her previous lung cancer. I have ordered PFTs to assess lung function. Additionally, I have referred this patient to Dr. Nestor Lewandowsky, MD for further evaluation and possible surgical intervention.    Honor Loh, NP  08/05/2017, 12:12PM

## 2017-08-05 ENCOUNTER — Encounter: Payer: Self-pay | Admitting: *Deleted

## 2017-08-05 ENCOUNTER — Telehealth: Payer: Self-pay | Admitting: Urgent Care

## 2017-08-05 ENCOUNTER — Other Ambulatory Visit: Payer: Self-pay | Admitting: Urgent Care

## 2017-08-05 ENCOUNTER — Inpatient Hospital Stay: Payer: Medicaid Other | Attending: Hematology and Oncology | Admitting: Urgent Care

## 2017-08-05 VITALS — BP 119/80 | HR 95 | Temp 97.7°F | Resp 20 | Wt 115.2 lb

## 2017-08-05 DIAGNOSIS — Z87891 Personal history of nicotine dependence: Secondary | ICD-10-CM | POA: Diagnosis not present

## 2017-08-05 DIAGNOSIS — R970 Elevated carcinoembryonic antigen [CEA]: Secondary | ICD-10-CM | POA: Insufficient documentation

## 2017-08-05 DIAGNOSIS — Z85118 Personal history of other malignant neoplasm of bronchus and lung: Secondary | ICD-10-CM | POA: Insufficient documentation

## 2017-08-05 DIAGNOSIS — I10 Essential (primary) hypertension: Secondary | ICD-10-CM

## 2017-08-05 DIAGNOSIS — R0609 Other forms of dyspnea: Secondary | ICD-10-CM

## 2017-08-05 DIAGNOSIS — Z801 Family history of malignant neoplasm of trachea, bronchus and lung: Secondary | ICD-10-CM | POA: Diagnosis not present

## 2017-08-05 DIAGNOSIS — R600 Localized edema: Secondary | ICD-10-CM | POA: Diagnosis not present

## 2017-08-05 DIAGNOSIS — Z7984 Long term (current) use of oral hypoglycemic drugs: Secondary | ICD-10-CM | POA: Insufficient documentation

## 2017-08-05 DIAGNOSIS — R918 Other nonspecific abnormal finding of lung field: Secondary | ICD-10-CM

## 2017-08-05 DIAGNOSIS — E7800 Pure hypercholesterolemia, unspecified: Secondary | ICD-10-CM | POA: Diagnosis not present

## 2017-08-05 DIAGNOSIS — R911 Solitary pulmonary nodule: Secondary | ICD-10-CM | POA: Diagnosis not present

## 2017-08-05 DIAGNOSIS — Z9981 Dependence on supplemental oxygen: Secondary | ICD-10-CM

## 2017-08-05 DIAGNOSIS — R0600 Dyspnea, unspecified: Secondary | ICD-10-CM

## 2017-08-05 DIAGNOSIS — Z7982 Long term (current) use of aspirin: Secondary | ICD-10-CM | POA: Insufficient documentation

## 2017-08-05 DIAGNOSIS — J449 Chronic obstructive pulmonary disease, unspecified: Secondary | ICD-10-CM | POA: Diagnosis not present

## 2017-08-05 DIAGNOSIS — Z86010 Personal history of colonic polyps: Secondary | ICD-10-CM | POA: Diagnosis not present

## 2017-08-05 DIAGNOSIS — E1151 Type 2 diabetes mellitus with diabetic peripheral angiopathy without gangrene: Secondary | ICD-10-CM | POA: Diagnosis not present

## 2017-08-05 DIAGNOSIS — C3491 Malignant neoplasm of unspecified part of right bronchus or lung: Secondary | ICD-10-CM

## 2017-08-05 DIAGNOSIS — Z79899 Other long term (current) drug therapy: Secondary | ICD-10-CM | POA: Diagnosis not present

## 2017-08-05 DIAGNOSIS — I359 Nonrheumatic aortic valve disorder, unspecified: Secondary | ICD-10-CM

## 2017-08-05 NOTE — Progress Notes (Signed)
At weekly conference discussed case with Dr. Kathlene Cote, Interventional radiology, who is familiar with this patient. Reviewed current findings as well as past medical history. Discussed concern with area in question in Right lung being ground glass. Dr. Kathlene Cote felt that a PET scan may be falsely negative and that biopsy may also have low yield due to this. Noting prior poor lung function, with current ground glass area being very peripheral in lung, surgery with wedge resection may still be an option. Recommendation is for PFT's and consultation with Dr. Genevive Bi if patient is in agreement.

## 2017-08-05 NOTE — Telephone Encounter (Signed)
Called to review consensus from multidisciplinary tumor board. No answer. LMOM with detailed plan of care. Dr. Kathlene Cote advised that a biopsy is not the best approach for this patient. Recommendations were to consults CVTS to see if a wedge resection is possible. Of note, she has been previously deemed not a surgical candidate due to her overall respiratory status. I have ordered the referral and PFTs as recommended. Patient advised to return a call with any questions or concerns. Message sent to scheduling to arrange for consult and ordered PFTs.

## 2017-08-05 NOTE — Progress Notes (Signed)
Patient offers no complaints today.  Here today for CT results. 

## 2017-08-06 ENCOUNTER — Ambulatory Visit: Payer: Medicaid Other | Admitting: Hematology and Oncology

## 2017-08-09 ENCOUNTER — Ambulatory Visit
Admission: RE | Admit: 2017-08-09 | Discharge: 2017-08-09 | Disposition: A | Discharge status: Home / Self Care | Payer: Medicaid Other | Source: Ambulatory Visit | Attending: Urgent Care | Admitting: Urgent Care

## 2017-08-09 DIAGNOSIS — E7800 Pure hypercholesterolemia, unspecified: Secondary | ICD-10-CM | POA: Insufficient documentation

## 2017-08-09 DIAGNOSIS — I10 Essential (primary) hypertension: Secondary | ICD-10-CM | POA: Insufficient documentation

## 2017-08-09 DIAGNOSIS — J449 Chronic obstructive pulmonary disease, unspecified: Secondary | ICD-10-CM | POA: Insufficient documentation

## 2017-08-09 DIAGNOSIS — I700 Atherosclerosis of aorta: Secondary | ICD-10-CM | POA: Diagnosis not present

## 2017-08-09 DIAGNOSIS — E1151 Type 2 diabetes mellitus with diabetic peripheral angiopathy without gangrene: Secondary | ICD-10-CM | POA: Diagnosis not present

## 2017-08-09 DIAGNOSIS — I359 Nonrheumatic aortic valve disorder, unspecified: Secondary | ICD-10-CM | POA: Diagnosis not present

## 2017-08-09 DIAGNOSIS — I083 Combined rheumatic disorders of mitral, aortic and tricuspid valves: Secondary | ICD-10-CM | POA: Insufficient documentation

## 2017-08-09 DIAGNOSIS — R0602 Shortness of breath: Secondary | ICD-10-CM | POA: Diagnosis not present

## 2017-08-19 ENCOUNTER — Ambulatory Visit: Payer: Medicaid Other | Attending: Urgent Care

## 2017-08-19 DIAGNOSIS — C3490 Malignant neoplasm of unspecified part of unspecified bronchus or lung: Secondary | ICD-10-CM | POA: Insufficient documentation

## 2017-08-19 DIAGNOSIS — C3491 Malignant neoplasm of unspecified part of right bronchus or lung: Secondary | ICD-10-CM

## 2017-08-19 MED ORDER — ALBUTEROL SULFATE (2.5 MG/3ML) 0.083% IN NEBU
2.5000 mg | INHALATION_SOLUTION | Freq: Once | RESPIRATORY_TRACT | Status: AC
  Administered 2017-08-19: 2.5 mg via RESPIRATORY_TRACT
  Filled 2017-08-19: qty 3

## 2017-08-20 ENCOUNTER — Ambulatory Visit (INDEPENDENT_AMBULATORY_CARE_PROVIDER_SITE_OTHER): Payer: Medicaid Other | Admitting: Cardiothoracic Surgery

## 2017-08-20 ENCOUNTER — Encounter: Payer: Self-pay | Admitting: Cardiothoracic Surgery

## 2017-08-20 VITALS — BP 117/77 | HR 94 | Temp 98.2°F | Resp 18 | Ht <= 58 in | Wt 114.8 lb

## 2017-08-20 DIAGNOSIS — R918 Other nonspecific abnormal finding of lung field: Secondary | ICD-10-CM | POA: Diagnosis not present

## 2017-08-20 NOTE — Progress Notes (Signed)
[name], female   DOB: April 01, 1956, 62 y.o.   MRN: [id number]  Chief Complaint  Patient presents with  . New Patient (Initial Visit)    new patient Primary malignant neoplasm of right lung referred by Dr. Mike Gip    Referred By Dr. Mike Gip Reason for Referral right lower lobe mass  HPI Location, Quality, Duration, Severity, Timing, Context, Modifying Factors, Associated Signs and Symptoms.  Angela Jensen is a 62 y.o. female.  She is in patient who had a prior history of a right upper lobe malignancy.  This was treated with external beam radiotherapy.  This was about 5 years ago.  She has been followed by our oncologist over the years and she has developed a more well-defined right lower lobe superior segment groundglass opacity.  This was felt to be a consideration for an adenocarcinoma and she was sent here for consideration of surgical therapy.  She did have some pulmonary function studies done which reveal an FEV1 of between 30 and 38% and a DLCO uncorrected at 46%.  She does not really complain of any significant shortness of breath at this time.  She has not had a lung biopsy.  She did have a nodule develop and was referred for radiofrequency ablation but upon further imaging the nodule had disappeared.  That was about 2 years ago.  She states that she has some mild bronchitis now but otherwise is getting along pretty well.  She has stopped smoking and has not smoked for the last 6 years.   Past Medical History:  Diagnosis Date  . Arthritis   . Asthma   . Atherosclerosis of native arteries of extremity with intermittent claudication ([place]) 05/26/2016  . Benign neoplasm of sigmoid colon   . Cancer [facility]) 2012   Right Lung CA  . COPD (chronic obstructive pulmonary disease) ([place])   . Depression   . Diabetes mellitus without complication [facility])    Patient takes Janumet  . Essential hypertension 05/26/2016  . Hypercholesteremia   . Hypertension   . Oxygen dependent    2L  at nite   . PAD (peripheral artery disease) ([place]) 06/22/2016  . Peripheral vascular disease ([place])   . Shortness of breath dyspnea    with exertion     Past Surgical History:  Procedure Laterality Date  . CESAREAN SECTION     x3  . COLONOSCOPY WITH PROPOFOL N/A 06/25/2015   Procedure: COLONOSCOPY WITH PROPOFOL;  Surgeon: Lucilla Lame, MD;  Location: [facility] ENDOSCOPY;  Service: Endoscopy;  Laterality: N/A;  . CYST REMOVAL LEG     and on shoulder   . LUNG BIOPSY  05 15 2013   has lung "spots"  . PERIPHERAL VASCULAR CATHETERIZATION Left 06/01/2016   Procedure: Lower Extremity Angiography;  Surgeon: Algernon Huxley, MD;  Location: [place] CV LAB;  Service: Cardiovascular;  Laterality: Left;  . PERIPHERAL VASCULAR CATHETERIZATION N/A 06/01/2016   Procedure: Abdominal Aortogram w/Lower Extremity;  Surgeon: Algernon Huxley, MD;  Location: [place] CV LAB;  Service: Cardiovascular;  Laterality: N/A;  . PERIPHERAL VASCULAR CATHETERIZATION  06/01/2016   Procedure: Lower Extremity Intervention;  Surgeon: Algernon Huxley, MD;  Location: [place] CV LAB;  Service: Cardiovascular;;  . PERIPHERAL VASCULAR CATHETERIZATION Right 06/08/2016   Procedure: Lower Extremity Angiography;  Surgeon: Algernon Huxley, MD;  Location: [place] CV LAB;  Service: Cardiovascular;  Laterality: Right;  . PERIPHERAL VASCULAR CATHETERIZATION  06/08/2016   Procedure: Lower Extremity Intervention;  Surgeon: Corene Cornea  Bunnie Domino, MD;  Location: [place] [facility] LAB;  Service: Cardiovascular;;    Family History  Problem Relation Age of Onset  . Lung cancer Father   . Diabetes Mother   . Diabetes Sister   . Diabetes Maternal Grandmother   . Diabetes Paternal Grandmother   . Diabetes Sister     Social History Social History   Tobacco Use  . Smoking status: Former Smoker    Packs/day: 0.90    Years: 37.00    Pack years: 33.30    Types: Cigarettes    Last attempt to quit: 02/06/2010    Years since quitting: 7.5  .  Smokeless tobacco: Former Systems developer    Types: Snuff  Substance Use Topics  . Alcohol use: No    Comment: hx of etoh use   . Drug use: Yes    Types: Marijuana    Comment: hx of cocaine use- last use 2015     No Known Allergies  Current Outpatient Medications  Medication Sig Dispense Refill  . albuterol (PROAIR HFA) 108 (90 BASE) MCG/ACT inhaler Inhale 2 puffs into the lungs every 4 (four) hours as needed for wheezing or shortness of breath.     Marland Kitchen albuterol (PROVENTIL) (2.5 MG/3ML) 0.083% nebulizer solution Take 2.5 mg by nebulization every 6 (six) hours as needed for wheezing.     Marland Kitchen amLODipine (NORVASC) 10 MG tablet Take 10 mg by mouth every morning.     Marland Kitchen aspirin EC 81 MG tablet Take 1 tablet (81 mg total) by mouth daily. 150 tablet 2  . buPROPion (WELLBUTRIN) 75 MG tablet Take 75 mg by mouth 2 (two) times daily.    . clopidogrel (PLAVIX) 75 MG tablet TAKE ONE TABLET BY MOUTH ONCE DAILY 30 tablet 11  . gabapentin (NEURONTIN) 100 MG capsule Take 100 mg by mouth 2 (two) times daily.    Marland Kitchen lisinopril-hydrochlorothiazide (PRINZIDE,ZESTORETIC) 10-12.5 MG per tablet Take 1 tablet by mouth every morning.    . meloxicam (MOBIC) 7.5 MG tablet Take 7.5 mg by mouth 2 (two) times daily.    . mometasone-formoterol (DULERA) 200-5 MCG/ACT AERO Inhale 1 puff into the lungs 2 (two) times daily.    . OXYGEN Inhale 2 L into the lungs at bedtime.    . potassium chloride (K-DUR) 10 MEQ tablet Take 10 mEq by mouth every other day. Instructed to take 3meq daily from 9/6 - 9/11 then resume taking 17meq every other day.    Marland Kitchen rOPINIRole (REQUIP) 0.5 MG tablet Take 0.5 mg by mouth 2 (two) times daily.     . rosuvastatin (CRESTOR) 10 MG tablet Take 10 mg by mouth at bedtime.     . SitaGLIPtin-MetFORMIN HCl (JANUMET XR) 50-500 MG TB24 Take 1 tablet by mouth daily with breakfast.     . tiotropium (SPIRIVA HANDIHALER) 18 MCG inhalation capsule Place 18 mcg into inhaler and inhale daily.     No current  facility-administered medications for this visit.       Review of Systems A complete review of systems was asked and was negative except for the following positive findings cough, wheezing, joint pain, easy bruising.  Blood pressure 117/77, pulse 94, temperature 98.2 F (36.8 C), temperature source Oral, resp. rate 18, height 4' 1.32" (1.253 m), weight 114 lb 12.8 oz (52.1 kg), SpO2 93 %.  Physical Exam CONSTITUTIONAL:  Pleasant, well-developed, well-nourished, and in no acute distress. EYES: Pupils equal and reactive to light, Sclera non-icteric EARS, NOSE, MOUTH AND THROAT:  The oropharynx  was clear.  Dentition is good repair.  Oral mucosa pink and moist. LYMPH NODES:  Lymph nodes in the neck and axillae were normal RESPIRATORY:  Lungs were clear.  Normal respiratory effort without pathologic use of accessory muscles of respiration CARDIOVASCULAR: Heart was regular without murmurs.  There were no carotid bruits. GI: The abdomen was soft, nontender, and nondistended. There were no palpable masses. There was no hepatosplenomegaly. There were normal bowel sounds in all quadrants. GU:  Rectal deferred.   MUSCULOSKELETAL:  Normal muscle strength and tone.  No clubbing or cyanosis.   SKIN:  There were no pathologic skin lesions.  There were no nodules on palpation. NEUROLOGIC:  Sensation is normal.  Cranial nerves are grossly intact. PSYCH:  Oriented to person, place and time.  Mood and affect are normal.  Data Reviewed CT scans  I have personally reviewed the patient's imaging, laboratory findings and medical records.    Assessment    I have independently reviewed the patient's CT scans.  There has been an ill-defined area in the superior segment of the right lower lobe going back over the last several CTs.  In fact there was an area that was somewhat suspicious in the right lower lobe back in 2012.  This area has not yet been biopsied although it may represent a slow-growing  adenocarcinoma.  I did review her pulmonary function studies.  They are quite poor with an FEV1 of less than 40%.    Plan    At the present time the patient is still reluctant to pursue surgical therapy but thought that radiation therapy would be preferable if it were possible.  I did explain to the patient that her pulmonary function studies were quite poor and that I would at least like to get Dr. Donette Larry opinion as to whether or not she would be a candidate for stereotactic body frame radiotherapy.  She may also be a candidate for percutaneous RFA.  At the present time we will go ahead and set her up to see Dr. Donella Stade and get his opinion.  I will be happy to see her in the future should the need arise.       Nestor Lewandowsky, MD 08/20/2017, 10:41 AM

## 2017-08-20 NOTE — Patient Instructions (Signed)
We will make an appointment with Dr.Corcoran and call you later today.  We will also send a referral to [facility].  Please call our office if you have not heard from anyone from their office by Monday 08/23/17.

## 2017-08-23 ENCOUNTER — Telehealth: Payer: Self-pay | Admitting: Cardiothoracic Surgery

## 2017-08-23 NOTE — Telephone Encounter (Signed)
Spoke with patient regarding appointments.  I am still waiting to hear back from [place] in the [place] center about Dr.Corcoran. Will notify patient as soon as I hear back.  08/30/17 @ 9am with Dr.Crystal.  Patient confirmed appointment.

## 2017-08-23 NOTE — Telephone Encounter (Signed)
Patient notified  regarding appointment listed below.  08/31/17 Dr.Corcoar @ 9:15 am Cancer center.

## 2017-08-23 NOTE — Telephone Encounter (Signed)
Patient called, just checking to see if we have schedule her appointments with Dr. Mike Gip, I informed the patient we are waiting to hear back from there office and that you would call the patient when we hear back from them.

## 2017-08-27 DIAGNOSIS — R0602 Shortness of breath: Secondary | ICD-10-CM | POA: Insufficient documentation

## 2017-08-27 DIAGNOSIS — Z86010 Personal history of colonic polyps: Secondary | ICD-10-CM | POA: Insufficient documentation

## 2017-08-27 DIAGNOSIS — Z87891 Personal history of nicotine dependence: Secondary | ICD-10-CM | POA: Insufficient documentation

## 2017-08-27 DIAGNOSIS — Z79899 Other long term (current) drug therapy: Secondary | ICD-10-CM | POA: Insufficient documentation

## 2017-08-27 DIAGNOSIS — J45909 Unspecified asthma, uncomplicated: Secondary | ICD-10-CM | POA: Insufficient documentation

## 2017-08-27 DIAGNOSIS — I10 Essential (primary) hypertension: Secondary | ICD-10-CM | POA: Insufficient documentation

## 2017-08-27 DIAGNOSIS — Z801 Family history of malignant neoplasm of trachea, bronchus and lung: Secondary | ICD-10-CM | POA: Insufficient documentation

## 2017-08-27 DIAGNOSIS — Z7982 Long term (current) use of aspirin: Secondary | ICD-10-CM | POA: Insufficient documentation

## 2017-08-27 DIAGNOSIS — R918 Other nonspecific abnormal finding of lung field: Secondary | ICD-10-CM | POA: Insufficient documentation

## 2017-08-27 DIAGNOSIS — I739 Peripheral vascular disease, unspecified: Secondary | ICD-10-CM | POA: Insufficient documentation

## 2017-08-27 DIAGNOSIS — J449 Chronic obstructive pulmonary disease, unspecified: Secondary | ICD-10-CM | POA: Insufficient documentation

## 2017-08-27 DIAGNOSIS — I2510 Atherosclerotic heart disease of native coronary artery without angina pectoris: Secondary | ICD-10-CM | POA: Insufficient documentation

## 2017-08-27 DIAGNOSIS — Z85118 Personal history of other malignant neoplasm of bronchus and lung: Secondary | ICD-10-CM | POA: Insufficient documentation

## 2017-08-27 DIAGNOSIS — Z923 Personal history of irradiation: Secondary | ICD-10-CM | POA: Insufficient documentation

## 2017-08-27 DIAGNOSIS — F329 Major depressive disorder, single episode, unspecified: Secondary | ICD-10-CM | POA: Insufficient documentation

## 2017-08-27 DIAGNOSIS — E119 Type 2 diabetes mellitus without complications: Secondary | ICD-10-CM | POA: Insufficient documentation

## 2017-08-30 ENCOUNTER — Ambulatory Visit
Admission: RE | Admit: 2017-08-30 | Discharge: 2017-08-30 | Disposition: A | Discharge status: Home / Self Care | Payer: Medicaid Other | Source: Ambulatory Visit | Attending: Radiation Oncology | Admitting: Radiation Oncology

## 2017-08-30 ENCOUNTER — Other Ambulatory Visit: Payer: Self-pay

## 2017-08-30 ENCOUNTER — Encounter: Payer: Self-pay | Admitting: Radiation Oncology

## 2017-08-30 ENCOUNTER — Other Ambulatory Visit: Payer: Self-pay | Admitting: *Deleted

## 2017-08-30 VITALS — BP 132/87 | HR 94 | Temp 97.8°F | Resp 20 | Ht 59.0 in | Wt 116.7 lb

## 2017-08-30 DIAGNOSIS — I10 Essential (primary) hypertension: Secondary | ICD-10-CM | POA: Diagnosis not present

## 2017-08-30 DIAGNOSIS — Z7982 Long term (current) use of aspirin: Secondary | ICD-10-CM | POA: Diagnosis not present

## 2017-08-30 DIAGNOSIS — R918 Other nonspecific abnormal finding of lung field: Secondary | ICD-10-CM | POA: Diagnosis not present

## 2017-08-30 DIAGNOSIS — E119 Type 2 diabetes mellitus without complications: Secondary | ICD-10-CM | POA: Diagnosis not present

## 2017-08-30 DIAGNOSIS — Z923 Personal history of irradiation: Secondary | ICD-10-CM | POA: Diagnosis not present

## 2017-08-30 DIAGNOSIS — J45909 Unspecified asthma, uncomplicated: Secondary | ICD-10-CM | POA: Diagnosis not present

## 2017-08-30 DIAGNOSIS — Z85118 Personal history of other malignant neoplasm of bronchus and lung: Secondary | ICD-10-CM | POA: Diagnosis not present

## 2017-08-30 DIAGNOSIS — Z79899 Other long term (current) drug therapy: Secondary | ICD-10-CM | POA: Diagnosis not present

## 2017-08-30 DIAGNOSIS — Z87891 Personal history of nicotine dependence: Secondary | ICD-10-CM | POA: Diagnosis not present

## 2017-08-30 DIAGNOSIS — I2510 Atherosclerotic heart disease of native coronary artery without angina pectoris: Secondary | ICD-10-CM | POA: Diagnosis not present

## 2017-08-30 DIAGNOSIS — F329 Major depressive disorder, single episode, unspecified: Secondary | ICD-10-CM | POA: Diagnosis not present

## 2017-08-30 DIAGNOSIS — I739 Peripheral vascular disease, unspecified: Secondary | ICD-10-CM | POA: Diagnosis not present

## 2017-08-30 DIAGNOSIS — R0602 Shortness of breath: Secondary | ICD-10-CM | POA: Diagnosis not present

## 2017-08-30 DIAGNOSIS — Z801 Family history of malignant neoplasm of trachea, bronchus and lung: Secondary | ICD-10-CM | POA: Diagnosis not present

## 2017-08-30 DIAGNOSIS — J449 Chronic obstructive pulmonary disease, unspecified: Secondary | ICD-10-CM | POA: Diagnosis not present

## 2017-08-30 DIAGNOSIS — Z86010 Personal history of colonic polyps: Secondary | ICD-10-CM | POA: Diagnosis not present

## 2017-08-30 NOTE — Consult Note (Signed)
NEW PATIENT EVALUATION  Name: Angela Jensen  MRN: [id number]  Date:   08/30/2017     DOB: 12-02-1955   This 62 y.o. female patient presents to the clinic for initial evaluation of new right lung lesion inpatient previously treated 6 years prior for a non-small cell lung cancer of the right upper lobe.  REFERRING PHYSICIAN: Ronnell Freshwater, NP  CHIEF COMPLAINT:  Chief Complaint  Patient presents with  . Lung Cancer    Pt is here for initial consultation of lung mass.     DIAGNOSIS: The encounter diagnosis was Right lower lobe lung mass.   PREVIOUS INVESTIGATIONS:  CT scans are reviewed CT-guided biopsy of right lower lobe lesion ordered Clinical notes reviewed  HPI: Patient is a 62 year old female with who received prior radiation therapy over 6 years prior for non-small cell lung cancer of the right upper lobe. They've been following a lesion in the right upper lobe superior segment. This lesion has shown some increased size compared to previous study and Dr. Faith Rogue has refer patient to me for possibility of further treatment. She is asymptomatic specifically denies cough hemoptysis or chest tightness. Have her mother function tests rapport with her FEV1 of between 30 and 30%. She seen today for radiation oncology opinion.  PLANNED TREATMENT REGIMEN: Possible SB RT  PAST MEDICAL HISTORY:  has a past medical history of Arthritis, Asthma, Atherosclerosis of native arteries of extremity with intermittent claudication ([place]) (05/26/2016), Benign neoplasm of sigmoid colon, Cancer ([place]) (2012), COPD (chronic obstructive pulmonary disease) ([place]), Depression, Diabetes mellitus without complication ([place]), Essential hypertension (05/26/2016), Hypercholesteremia, Hypertension, Oxygen dependent, PAD (peripheral artery disease) ([place]) (06/22/2016), Peripheral vascular disease ([place]), and Shortness of breath dyspnea.    PAST SURGICAL HISTORY:  Past Surgical History:  Procedure Laterality Date  .  CESAREAN SECTION     x3  . COLONOSCOPY WITH PROPOFOL N/A 06/25/2015   Procedure: COLONOSCOPY WITH PROPOFOL;  Surgeon: Lucilla Lame, MD;  Location: [facility] END[facility];  Service: Endoscopy;  Laterality: N/A;  . CYST REMOVAL LEG     and on shoulder   . LUNG BIOPSY  05 15 2013   has lung "spots"  . PERIPHERAL VASCULAR CATHETERIZATION Left 06/01/2016   Procedure: Lower Extremity Angiography;  Surgeon: Algernon Huxley, MD;  Location: [place] [facility];  Service: Cardiovascular;  Laterality: Left;  . PERIPHERAL VASCULAR CATHETERIZATION N/A 06/01/2016   Procedure: Abdominal Aortogram w/Lower Extremity;  Surgeon: Algernon Huxley, MD;  Location: [place] [facility];  Service: Cardiovascular;  Laterality: N/A;  . PERIPHERAL VASCULAR CATHETERIZATION  06/01/2016   Procedure: Lower Extremity Intervention;  Surgeon: Algernon Huxley, MD;  Location: [place] CV LAB;  Service: Cardiovascular;;  . PERIPHERAL VASCULAR CATHETERIZATION Right 06/08/2016   Procedure: Lower Extremity Angiography;  Surgeon: Algernon Huxley, MD;  Location: [place] [facility] LAB;  Service: Cardiovascular;  Laterality: Right;  . PERIPHERAL VASCULAR CATHETERIZATION  06/08/2016   Procedure: Lower Extremity Intervention;  Surgeon: Algernon Huxley, MD;  Location: [place] CV LAB;  Service: Cardiovascular;;    FAMILY HISTORY: family history includes Diabetes in her maternal grandmother, mother, paternal grandmother, sister, and sister; Lung cancer in her father.  SOCIAL HISTORY:  reports that she quit smoking about 7 years ago. Her smoking use included cigarettes. She has a 33.30 pack-year smoking history. She has quit using smokeless tobacco. Her smokeless tobacco use included snuff. She reports that she uses drugs. Drug: Marijuana. She reports that she does not drink alcohol.  ALLERGIES: Patient  has no known allergies.  MEDICATIONS:  Current Outpatient Medications  Medication Sig Dispense Refill  . albuterol (PROAIR HFA) 108 (90 BASE) MCG/ACT  inhaler Inhale 2 puffs into the lungs every 4 (four) hours as needed for wheezing or shortness of breath.     Marland Kitchen albuterol (PROVENTIL) (2.5 MG/3ML) 0.083% nebulizer solution Take 2.5 mg by nebulization every 6 (six) hours as needed for wheezing.     Marland Kitchen amLODipine (NORVASC) 10 MG tablet Take 10 mg by mouth every morning.     Marland Kitchen aspirin EC 81 MG tablet Take 1 tablet (81 mg total) by mouth daily. 150 tablet 2  . buPROPion (WELLBUTRIN) 75 MG tablet Take 75 mg by mouth 2 (two) times daily.    . clopidogrel (PLAVIX) 75 MG tablet TAKE ONE TABLET BY MOUTH ONCE DAILY 30 tablet 11  . gabapentin (NEURONTIN) 100 MG capsule Take 100 mg by mouth 2 (two) times daily.    Marland Kitchen lisinopril-hydrochlorothiazide (PRINZIDE,ZESTORETIC) 10-12.5 MG per tablet Take 1 tablet by mouth every morning.    . meloxicam (MOBIC) 7.5 MG tablet Take 7.5 mg by mouth 2 (two) times daily.    . mometasone-formoterol (DULERA) 200-5 MCG/ACT AERO Inhale 1 puff into the lungs 2 (two) times daily.    . OXYGEN Inhale 2 L into the lungs at bedtime.    . potassium chloride (K-DUR) 10 MEQ tablet Take 10 mEq by mouth every other day. Instructed to take 82meq daily from 9/6 - 9/11 then resume taking 43meq every other day.    Marland Kitchen rOPINIRole (REQUIP) 0.5 MG tablet Take 0.5 mg by mouth 2 (two) times daily.     . rosuvastatin (CRESTOR) 10 MG tablet Take 10 mg by mouth at bedtime.     . SitaGLIPtin-MetFORMIN HCl (JANUMET XR) 50-500 MG TB24 Take 1 tablet by mouth daily with breakfast.     . tiotropium (SPIRIVA HANDIHALER) 18 MCG inhalation capsule Place 18 mcg into inhaler and inhale daily.     No current facility-administered medications for this encounter.     ECOG PERFORMANCE STATUS:  0 - Asymptomatic  REVIEW OF SYSTEMS:  Patient denies any weight loss, fatigue, weakness, fever, chills or night sweats. Patient denies any loss of vision, blurred vision. Patient denies any ringing  of the ears or hearing loss. No irregular heartbeat. Patient denies heart  murmur or history of fainting. Patient denies any chest pain or pain radiating to her upper extremities. Patient denies any shortness of breath, difficulty breathing at night, cough or hemoptysis. Patient denies any swelling in the lower legs. Patient denies any nausea vomiting, vomiting of blood, or coffee ground material in the vomitus. Patient denies any stomach pain. Patient states has had normal bowel movements no significant constipation or diarrhea. Patient denies any dysuria, hematuria or significant nocturia. Patient denies any problems walking, swelling in the joints or loss of balance. Patient denies any skin changes, loss of hair or loss of weight. Patient denies any excessive worrying or anxiety or significant depression. Patient denies any problems with insomnia. Patient denies excessive thirst, polyuria, polydipsia. Patient denies any swollen glands, patient denies easy bruising or easy bleeding. Patient denies any recent infections, allergies or URI. Patient "s visual fields have not changed significantly in recent time.    PHYSICAL EXAM: BP 132/87   Pulse 94   Temp 97.8 F (36.6 C)   Resp 20   Ht 4\' 11"  (1.499 m)   Wt 116 lb 11.7 oz (53 kg)   BMI 23.58 kg/m  Well-developed well-nourished patient in NAD. HEENT reveals PERLA, EOMI, discs not visualized.  Oral cavity is clear. No oral mucosal lesions are identified. Neck is clear without evidence of cervical or supraclavicular adenopathy. Lungs are clear to A&P. Cardiac examination is essentially unremarkable with regular rate and rhythm without murmur rub or thrill. Abdomen is benign with no organomegaly or masses noted. Motor sensory and DTR levels are equal and symmetric in the upper and lower extremities. Cranial nerves II through XII are grossly intact. Proprioception is intact. No peripheral adenopathy or edema is identified. No motor or sensory levels are noted. Crude visual fields are within normal range.  LABORATORY DATA: CT  scan with biopsy has been ordered    RADIOLOGY RESULTS: Serial CT scans are reviewed and compatible with the above-stated findings   IMPRESSION: Possible new stage I non-small cell lung cancer of the right lung  PLAN: At this time of ordered a CT-guided biopsy of the new and progressive right lung lesion. Should this be a non-small cell lung cancer with offer SB RT to this lesion. I would plan on delivering 5000 cGy in 5 fractions. We'll check our previous treatment fields and dosimetry for a potential overlap. Risks and benefits of treatment again were reviewed with the patient. We'll follow-up with her about one week after her CT-guided biopsy to review those results.  I would like to take this opportunity to thank you for allowing me to participate in the care of your patient.Noreene Filbert, MD

## 2017-08-31 ENCOUNTER — Inpatient Hospital Stay: Payer: Medicaid Other | Attending: Hematology and Oncology | Admitting: Hematology and Oncology

## 2017-08-31 ENCOUNTER — Other Ambulatory Visit: Payer: Self-pay | Admitting: *Deleted

## 2017-08-31 VITALS — BP 123/78 | HR 92 | Temp 98.0°F | Resp 18 | Wt 116.3 lb

## 2017-08-31 DIAGNOSIS — Z87891 Personal history of nicotine dependence: Secondary | ICD-10-CM

## 2017-08-31 DIAGNOSIS — C3411 Malignant neoplasm of upper lobe, right bronchus or lung: Secondary | ICD-10-CM

## 2017-08-31 DIAGNOSIS — Z801 Family history of malignant neoplasm of trachea, bronchus and lung: Secondary | ICD-10-CM | POA: Diagnosis not present

## 2017-08-31 DIAGNOSIS — Z7902 Long term (current) use of antithrombotics/antiplatelets: Secondary | ICD-10-CM | POA: Diagnosis not present

## 2017-08-31 DIAGNOSIS — Z7982 Long term (current) use of aspirin: Secondary | ICD-10-CM

## 2017-08-31 DIAGNOSIS — D251 Intramural leiomyoma of uterus: Secondary | ICD-10-CM | POA: Diagnosis not present

## 2017-08-31 DIAGNOSIS — R970 Elevated carcinoembryonic antigen [CEA]: Secondary | ICD-10-CM | POA: Diagnosis not present

## 2017-08-31 DIAGNOSIS — Z86010 Personal history of colonic polyps: Secondary | ICD-10-CM | POA: Diagnosis not present

## 2017-08-31 DIAGNOSIS — R0602 Shortness of breath: Secondary | ICD-10-CM | POA: Diagnosis not present

## 2017-08-31 DIAGNOSIS — I2510 Atherosclerotic heart disease of native coronary artery without angina pectoris: Secondary | ICD-10-CM

## 2017-08-31 DIAGNOSIS — Z9981 Dependence on supplemental oxygen: Secondary | ICD-10-CM

## 2017-08-31 DIAGNOSIS — E7800 Pure hypercholesterolemia, unspecified: Secondary | ICD-10-CM | POA: Diagnosis not present

## 2017-08-31 DIAGNOSIS — M1990 Unspecified osteoarthritis, unspecified site: Secondary | ICD-10-CM

## 2017-08-31 DIAGNOSIS — I10 Essential (primary) hypertension: Secondary | ICD-10-CM | POA: Diagnosis not present

## 2017-08-31 DIAGNOSIS — J449 Chronic obstructive pulmonary disease, unspecified: Secondary | ICD-10-CM | POA: Diagnosis not present

## 2017-08-31 DIAGNOSIS — E1151 Type 2 diabetes mellitus with diabetic peripheral angiopathy without gangrene: Secondary | ICD-10-CM | POA: Diagnosis not present

## 2017-08-31 DIAGNOSIS — Z79899 Other long term (current) drug therapy: Secondary | ICD-10-CM

## 2017-08-31 DIAGNOSIS — R918 Other nonspecific abnormal finding of lung field: Secondary | ICD-10-CM

## 2017-08-31 NOTE — Progress Notes (Signed)
Patient offers no complaints today. 

## 2017-08-31 NOTE — Progress Notes (Signed)
[facility] day:  08/31/2017   Chief Complaint: Angela Jensen is a 62 y.o. female with a history of stage I lung cancer who is seen for assessment after interval surgical consultation.   HPI: The patient was last seen in the medical oncology clinic on 08/05/2017 by Honor Loh, NP.  At that time, she was doing well.  She only noted exertional dyspnea.  Low dose chest revealed the mass in the RUL had increased.  Dr. Kathlene Cote advised that a biopsy was not the best approach for this patient. Recommendations were to consult Dr. Genevive Bi to see if a wedge resection was possible.   She saw Dr. Genevive Bi on 08/20/2017.  PFTs revealed and FEV1 of 30 - 38% and a DLCO uncorrected at 46%.  The nodule was felt to be a possibly slow growing adenocarcinoma.  He was reluctant to pursue surgery but thought that radiation therapy would be preferable if it were possible.  She was scheduled to see Dr. Baruch Gouty.  She saw Dr. Baruch Gouty on 08/30/2017.  He ordered a CT guided biopsy.  If she is confirmed to have a non-small cell carcinoma, SBRT (5000 cGy in 5 fractions) would be offered. Biopsy has not been scheduled as of this time.   Symptomatically, she is doing well. She denies increased shortness of breath. She continues to have exertional dyspnea. She denies cough and hemoptysis. Patient notes that her diabetes is under better control.  She denies B symptoms and recent infections. She is eating well.  Her weight has increased by 2 pounds.   Past Medical History:  Diagnosis Date  . Arthritis   . Asthma   . Atherosclerosis of native arteries of extremity with intermittent claudication ([place]) 05/26/2016  . Benign neoplasm of sigmoid colon   . Cancer [facility]) 2012   Right Lung CA  . COPD (chronic obstructive pulmonary disease) ([place])   . Depression   . Diabetes mellitus without complication [facility])    Patient takes Janumet  . Essential hypertension 05/26/2016  . Hypercholesteremia    . Hypertension   . 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 PAD (peripheral artery disease) ([place]) 06/22/2016  . Peripheral vascular disease ([place])   . Shortness of breath dyspnea    with exertion     Past Surgical History:  Procedure Laterality Date  . CESAREAN SECTION     x3  . COLONOSCOPY WITH PROPOFOL N/A 06/25/2015   Procedure: COLONOSCOPY WITH PROPOFOL;  Surgeon: Lucilla Lame, MD;  Location: [facility] ENDOSCOPY;  Service: Endoscopy;  Laterality: N/A;  . CYST REMOVAL LEG     and on shoulder   . LUNG BIOPSY  05 15 2013   has lung "spots"  . PERIPHERAL VASCULAR CATHETERIZATION Left 06/01/2016   Procedure: Lower Extremity Angiography;  Surgeon: Algernon Huxley, MD;  Location: [place] [facility];  Service: Cardiovascular;  Laterality: Left;  . PERIPHERAL VASCULAR CATHETERIZATION N/A 06/01/2016   Procedure: Abdominal Aortogram w/Lower Extremity;  Surgeon: Algernon Huxley, MD;  Location: [place] [facility];  Service: Cardiovascular;  Laterality: N/A;  . PERIPHERAL VASCULAR CATHETERIZATION  06/01/2016   Procedure: Lower Extremity Intervention;  Surgeon: Algernon Huxley, MD;  Location: [place] CV LAB;  Service: Cardiovascular;;  . PERIPHERAL VASCULAR CATHETERIZATION Right 06/08/2016   Procedure: Lower Extremity Angiography;  Surgeon: Algernon Huxley, MD;  Location: [place] [facility] LAB;  Service: Cardiovascular;  Laterality: Right;  . PERIPHERAL VASCULAR CATHETERIZATION  06/08/2016  Procedure: Lower Extremity Intervention;  Surgeon: Algernon Huxley, MD;  Location: [place] CV LAB;  Service: Cardiovascular;;    Family History  Problem Relation Age of Onset  . Lung cancer Father   . Diabetes Mother   . Diabetes Sister   . Diabetes Maternal Grandmother   . Diabetes Paternal Grandmother   . Diabetes Sister     Social History:  reports that she quit smoking about 7 years ago. Her smoking use included cigarettes. She has a 33.30 pack-year smoking history. She has quit using smokeless tobacco. Her  smokeless tobacco use included snuff. She reports that she uses drugs. Drug: Marijuana. She reports that she does not drink alcohol.  She has not smoked for 1 month.  She started smoking at 17 until she was "53 something".  She lives in [place].  The patient is alone today.  Allergies: No Known Allergies  Current Medications: Current Outpatient Medications  Medication Sig Dispense Refill  . albuterol (PROAIR HFA) 108 (90 BASE) MCG/ACT inhaler Inhale 2 puffs into the lungs every 4 (four) hours as needed for wheezing or shortness of breath.     Marland Kitchen albuterol (PROVENTIL) (2.5 MG/3ML) 0.083% nebulizer solution Take 2.5 mg by nebulization every 6 (six) hours as needed for wheezing.     Marland Kitchen amLODipine (NORVASC) 10 MG tablet Take 10 mg by mouth every morning.     Marland Kitchen aspirin EC 81 MG tablet Take 1 tablet (81 mg total) by mouth daily. 150 tablet 2  . buPROPion (WELLBUTRIN) 75 MG tablet Take 75 mg by mouth 2 (two) times daily.    . clopidogrel (PLAVIX) 75 MG tablet TAKE ONE TABLET BY MOUTH ONCE DAILY 30 tablet 11  . gabapentin (NEURONTIN) 100 MG capsule Take 100 mg by mouth 2 (two) times daily.    Marland Kitchen lisinopril-hydrochlorothiazide (PRINZIDE,ZESTORETIC) 10-12.5 MG per tablet Take 1 tablet by mouth every morning.    . meloxicam (MOBIC) 7.5 MG tablet Take 7.5 mg by mouth 2 (two) times daily.    . mometasone-formoterol (DULERA) 200-5 MCG/ACT AERO Inhale 1 puff into the lungs 2 (two) times daily.    . OXYGEN Inhale 2 L into the lungs at bedtime.    . potassium chloride (K-DUR) 10 MEQ tablet Take 10 mEq by mouth every other day. Instructed to take 70mq daily from 9/6 - 9/11 then resume taking 124m every other day.    . Marland KitchenOPINIRole (REQUIP) 0.5 MG tablet Take 0.5 mg by mouth 2 (two) times daily.     . rosuvastatin (CRESTOR) 10 MG tablet Take 10 mg by mouth at bedtime.     . SitaGLIPtin-MetFORMIN HCl (JANUMET XR) 50-500 MG TB24 Take 1 tablet by mouth daily with breakfast.     . tiotropium (SPIRIVA HANDIHALER) 18  MCG inhalation capsule Place 18 mcg into inhaler and inhale daily.     No current facility-administered medications for this visit.     Review of Systems:  GENERAL:  Feels "good".  No fevers or sweats.  Weight up 2 pounds. PERFORMANCE STATUS (ECOG):  1 HEENT:  No visual changes, runny nose, sore throat, mouth sores or tenderness. Lungs: Exertional dyspnea. No increased shortness of breath or cough.  No hemoptysis. Cardiac:  No chest pain, palpitations, orthopnea, or PND. GI:   No nausea, vomiting, diarrhea, constipation, melena or hematochezia.  Prior colonoscopy. GU:  No urgency, frequency, dysuria, or hematuria. Musculoskeletal:  No back pain.  No joint pain.  No muscle tenderness. Extremities:  No pain or swelling. Skin:  No rashes or skin changes. Neuro:  No headache, numbness or weakness, balance or coordination issues. Endocrine:  Diabetes, under better control.  No thyroid issues, hot flashes or night sweats. Psych:  No mood changes, depression or anxiety. Pain:  No focal pain. Review of systems:  All other systems reviewed and found to be negative.  Physical Exam: Blood pressure 123/78, pulse 92, temperature 98 F (36.7 C), temperature source Tympanic, resp. rate 18, weight 116 lb 5 oz (52.8 kg), SpO2 95 %. GENERAL:  Thin woman sitting comfortably in the exam room in no acute distress. MENTAL STATUS:  Alert and oriented to person, place and time. HEAD:  Wearing a blue crochet hat. Shoulder length gray hair pulled back.  Normocephalic, atraumatic, face symmetric, no Cushingoid features. EYES:  Glasses. Brown eyes.  Pupils equal round and reactive to light and accomodation.  No conjunctivitis or scleral icterus. ENT:  Oropharynx clear without lesion.  Edentulous. Tongue normal. Mucous membranes moist.  RESPIRATORY:  Clear to auscultation without rales, wheezes or rhonchi. CARDIOVASCULAR:  Regular rate and rhythm without murmur, rub or gallop. ABDOMEN:  Soft, non-tender, with  active bowel sounds, and no hepatosplenomegaly.  No masses. SKIN:  No rashes, ulcers or lesions. EXTREMITIES: 1+ non-pitting edema in lower extremities. No skin discoloration or tenderness.  No palpable cords. LYMPH NODES: No palpable cervical, supraclavicular, axillary or inguinal adenopathy  NEUROLOGICAL: Unremarkable. PSYCH:  Appropriate.   No visits with results within 3 Day(s) from this visit.  Latest known visit with results is:  Appointment on 02/04/2017  Component Date Value Ref Range Status  . WBC 02/04/2017 4.9  3.6 - 11.0 K/uL Final  . RBC 02/04/2017 4.18  3.80 - 5.20 MIL/uL Final  . Hemoglobin 02/04/2017 12.5  12.0 - 16.0 g/dL Final  . HCT 02/04/2017 36.6  35.0 - 47.0 % Final  . MCV 02/04/2017 87.6  80.0 - 100.0 fL Final  . MCH 02/04/2017 29.8  26.0 - 34.0 pg Final  . MCHC 02/04/2017 34.0  32.0 - 36.0 g/dL Final  . RDW 02/04/2017 13.3  11.5 - 14.5 % Final  . Platelets 02/04/2017 235  150 - 440 K/uL Final  . Neutrophils Relative % 02/04/2017 61  % Final  . Neutro Abs 02/04/2017 3.1  1.4 - 6.5 K/uL Final  . Lymphocytes Relative 02/04/2017 27  % Final  . Lymphs Abs 02/04/2017 1.3  1.0 - 3.6 K/uL Final  . Monocytes Relative 02/04/2017 9  % Final  . Monocytes Absolute 02/04/2017 0.4  0.2 - 0.9 K/uL Final  . Eosinophils Relative 02/04/2017 2  % Final  . Eosinophils Absolute 02/04/2017 0.1  0 - 0.7 K/uL Final  . Basophils Relative 02/04/2017 1  % Final  . Basophils Absolute 02/04/2017 0.0  0 - 0.1 K/uL Final  . Sodium 02/04/2017 133* 135 - 145 mmol/L Final  . Potassium 02/04/2017 3.8  3.5 - 5.1 mmol/L Final  . Chloride 02/04/2017 97* 101 - 111 mmol/L Final  . CO2 02/04/2017 27  22 - 32 mmol/L Final  . Glucose, Bld 02/04/2017 85  65 - 99 mg/dL Final  . BUN 02/04/2017 16  6 - 20 mg/dL Final  . Creatinine, Ser 02/04/2017 0.86  0.44 - 1.00 mg/dL Final  . Calcium 02/04/2017 9.3  8.9 - 10.3 mg/dL Final  . Total Protein 02/04/2017 7.7  6.5 - 8.1 g/dL Final  . Albumin 02/04/2017  4.4  3.5 - 5.0 g/dL Final  . AST 02/04/2017 25  15 - 41 U/L Final  .  ALT 02/04/2017 16  14 - 54 U/L Final  . Alkaline Phosphatase 02/04/2017 57  38 - 126 U/L Final  . Total Bilirubin 02/04/2017 0.6  0.3 - 1.2 mg/dL Final  . GFR calc non Af Amer 02/04/2017 >60  >60 mL/min Final  . GFR calc Af Amer 02/04/2017 >60  >60 mL/min Final   Comment: (NOTE) The eGFR has been calculated using the CKD EPI equation. This calculation has not been validated in all clinical situations. eGFR's persistently <60 mL/min signify possible Chronic Kidney Disease.   . Anion gap 02/04/2017 9  5 - 15 Final    Assessment:  Angela Jensen is a 62 y.o. 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   She was not a good surgery candidate secondary to her lung function (FEV1 28%).  Chest CT on 07/31/2011 revealed a 1.1 x 0.6 cm RUL nodule and a 9 mm inferior aspect RUL nodule and a 4 mm nodule in the anterior aspect of the inferior aspect of the RUL.  PET scan on 08/03/2011 revealed a 10-mm spiculated right apical pulmonary nodule (SUV 2.5) suspcious for lung cancer. Left ovary was prominent (3.9 x 2.4 cm).   CA-125 was 9.3 (normal).  CT guided biopsy of the superior and inferior RUL nodules on 12/30/2011 revealed well differentiated adenocarcinoma of lung primary.  EGFR testing revealed no mutation.  She received IMRT 6000 cGy to both nodules from 01/26/2012 - 03/10/2012.  PET scan on 01/30/2015 revealed a hypermetabolic and enlarging RUL pulmonary nodules concerning for lung cancer recurrence.  There was a small focus of peripheral nodular consolidation in the superior segment of the right lower lobe with differential including inflammation/infection versus neoplasm.  There was no evidence of mediastinal nodal metastasis.    PET scan on 03/27/2016 revealed no hypermetabolic activity to suggest recurrent malignancy. There was a soft tissue density in the left pelvis near the  inguinal ring that was not hypermetabolic, but prominent and probably separate from the left ovary. This could be a wandering fibroid.  Although the overall appearance had not changed from 2012 and was not hypermetabolic, pelvic sonography or pelvic MRI for further characterization of the uterus and ovaries can be considered.  In 04/2015, there was an attempt at radiofrequency ablation of 1 enlarging RUL nodule.  At the time of the procedure, the nodule had shrunk to 5 mm from 9 mm.    Chest CT on 08/01/2015 revealed complete resolution of the prior nodule in the right lung apex.   Chest CT on 01/31/2016 revealed no active cardiopulmonary abnormalities.  There was stable appearance of right apical and right middle lobe scar like densities.   Low dose chest CT on 02/02/2017 revealed new ground-glass nodule in the right lower lobe. Lung-RADS 3 (probably benign findings).   Low dose chest CT  on 07/29/2017 demonstrated enlargement of the previously observed sub-solid pulmonary lesion. RIGHT upper lobe lesion measured 1.4 x 2.1 x 1.6 cm (previously 1.5 x 1.1 x 1.7 cm). There was also areas of focal architectural distortion in the RIGHT upper and middle lobes that were felt to be most compatible with areas of chronic post infectious or inflammatory scarring. Aortic value calcifications were noted. Lung-RADS 4BS (suspicious).   She has an elevated CEA of unclear etiology.  CEA was 8.1 on 01/31/2016 and 9.2 on 03/03/2016.  Colonoscopy on 06/25/2015 revealed a 4 mm polyp in the sigmoid colon.  Pathology revealed a tubular  adenoma without evidence of dysplasia or malignancy.  Pelvic MRI on 04/30/2016 revealed a 3.4 x 2.5 cm ovoid mass in the LEFT adnexa with signal characteristics identical to the intramural leiomyomas and consistent with an exophytic leiomyoma.  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  Endometrium was normal with a normal junctional zone.  Ovaries were not identified.  Symptomatically, she  feels "pretty good".  She has stable exertional dyspnea.  Exam is stable.  No new labs today.    Plan: 1. Review PFTs and evaluation from Dr. Genevive Bi. Patient sent to radiation oncology for evaluation.  2. Discuss consult with radiation on 08/30/2017.  Patient planned to have CT guided biopsy and subsequent SBRT if NSCL confirmed via biopsy.   3. RTC in 6 weeks for MD assessment and review of plan going forward.     Honor Loh, NP  08/31/2017, 4:35 PM  I saw and evaluated the patient, participating in the key portions of the service and reviewing pertinent diagnostic studies and records.  I reviewed the nurse practitioner's note and agree with the findings and the plan.  The assessment and plan were discussed with the patient.  Several questions were asked by the patient and answered.   Nolon Stalls, MD 08/31/2017, 4:35 PM

## 2017-09-12 ENCOUNTER — Encounter: Payer: Self-pay | Admitting: Hematology and Oncology

## 2017-09-16 ENCOUNTER — Other Ambulatory Visit: Payer: Self-pay | Admitting: Internal Medicine

## 2017-09-17 ENCOUNTER — Other Ambulatory Visit: Payer: Self-pay

## 2017-09-17 ENCOUNTER — Ambulatory Visit (INDEPENDENT_AMBULATORY_CARE_PROVIDER_SITE_OTHER): Payer: Medicaid Other | Admitting: Vascular Surgery

## 2017-09-17 ENCOUNTER — Ambulatory Visit (INDEPENDENT_AMBULATORY_CARE_PROVIDER_SITE_OTHER): Payer: Medicaid Other

## 2017-09-17 ENCOUNTER — Encounter (INDEPENDENT_AMBULATORY_CARE_PROVIDER_SITE_OTHER): Payer: Self-pay | Admitting: Vascular Surgery

## 2017-09-17 VITALS — BP 121/80 | HR 90 | Resp 17 | Ht 59.0 in | Wt 116.2 lb

## 2017-09-17 DIAGNOSIS — E118 Type 2 diabetes mellitus with unspecified complications: Secondary | ICD-10-CM

## 2017-09-17 DIAGNOSIS — I10 Essential (primary) hypertension: Secondary | ICD-10-CM | POA: Diagnosis not present

## 2017-09-17 DIAGNOSIS — E785 Hyperlipidemia, unspecified: Secondary | ICD-10-CM

## 2017-09-17 DIAGNOSIS — I70213 Atherosclerosis of native arteries of extremities with intermittent claudication, bilateral legs: Secondary | ICD-10-CM

## 2017-09-17 MED ORDER — AMLODIPINE BESYLATE 10 MG PO TABS: 10.0000 mg | ORAL_TABLET | Freq: Every morning | ORAL | 5 refills | Status: DC

## 2017-09-17 MED ORDER — GLUCOSE BLOOD VI STRP: ORAL_STRIP | 5 refills | Status: DC

## 2017-09-17 MED ORDER — TIOTROPIUM BROMIDE MONOHYDRATE 18 MCG IN CAPS: 18.0000 ug | ORAL_CAPSULE | Freq: Every day | RESPIRATORY_TRACT | 5 refills | Status: DC

## 2017-09-17 MED ORDER — MOMETASONE FURO-FORMOTEROL FUM 200-5 MCG/ACT IN AERO: 1.0000 | INHALATION_SPRAY | Freq: Two times a day (BID) | RESPIRATORY_TRACT | 5 refills | Status: DC

## 2017-09-17 NOTE — Assessment & Plan Note (Signed)
ABIs today remain normal in the right at 1.01 and stable on the left at 0.76.  Her symptoms are currently not lifestyle limiting but they are noticeable in the left leg.  At this point, I would recommend she continue her aspirin, Plavix, and statin agent.  She should continue walking and increasing her activity as much as possible.  I will plan to see her back in 6 months or sooner if problems develop in the interim.

## 2017-09-17 NOTE — Progress Notes (Signed)
MRN : [id number]  Angela Jensen is a 62 y.o. (08-30-1955) female who presents with chief complaint of  Chief Complaint  Patient presents with  . Follow-up    32mo ABI  .  History of Present Illness: Patient returns today in follow up of PAD.  She is having some claudication in the left leg, but this is not currently lifestyle limiting.  No rest pain or ulceration. ABIs today remain normal in the right at 1.01 and stable on the left at 0.76.  Current Outpatient Medications  Medication Sig Dispense Refill  . albuterol (PROAIR HFA) 108 (90 BASE) MCG/ACT inhaler Inhale 2 puffs into the lungs every 4 (four) hours as needed for wheezing or shortness of breath.     Marland Kitchen albuterol (PROVENTIL) (2.5 MG/3ML) 0.083% nebulizer solution Take 2.5 mg by nebulization every 6 (six) hours as needed for wheezing.     Marland Kitchen amLODipine (NORVASC) 10 MG tablet Take 1 tablet (10 mg total) by mouth every morning. 30 tablet 5  . aspirin EC 81 MG tablet Take 1 tablet (81 mg total) by mouth daily. 150 tablet 2  . buPROPion (WELLBUTRIN) 75 MG tablet Take 75 mg by mouth 2 (two) times daily.    . clopidogrel (PLAVIX) 75 MG tablet TAKE ONE TABLET BY MOUTH ONCE DAILY 30 tablet 11  . gabapentin (NEURONTIN) 100 MG capsule Take 100 mg by mouth 2 (two) times daily.    Marland Kitchen glucose blood (ACCU-CHEK AVIVA PLUS) test strip Use as instructed to check blood sugars three times daily.  E11.65 250 each 5  . lisinopril-hydrochlorothiazide (PRINZIDE,ZESTORETIC) 10-12.5 MG per tablet Take 1 tablet by mouth every morning.    . meloxicam (MOBIC) 7.5 MG tablet Take 7.5 mg by mouth 2 (two) times daily.    . mometasone-formoterol (DULERA) 200-5 MCG/ACT AERO Inhale 1 puff into the lungs 2 (two) times daily. 1 Inhaler 5  . OXYGEN Inhale 2 L into the lungs at bedtime.    . potassium chloride (K-DUR) 10 MEQ tablet Take 10 mEq by mouth every other day. Instructed to take 59meq daily from 9/6 - 9/11 then resume taking 72meq every other day.    Marland Kitchen rOPINIRole  (REQUIP) 0.5 MG tablet Take 0.5 mg by mouth 2 (two) times daily.     . rosuvastatin (CRESTOR) 10 MG tablet Take 10 mg by mouth at bedtime.     . SitaGLIPtin-MetFORMIN HCl (JANUMET XR) 50-500 MG TB24 Take 1 tablet by mouth daily with breakfast.     . tiotropium (SPIRIVA HANDIHALER) 18 MCG inhalation capsule Place 1 capsule (18 mcg total) into inhaler and inhale daily. 30 capsule 5   No current facility-administered medications for this visit.     Past Medical History:  Diagnosis Date  . Arthritis   . Asthma   . Atherosclerosis of native arteries of extremity with intermittent claudication ([place]) 05/26/2016  . Benign neoplasm of sigmoid colon   . Cancer [facility]) 2012   Right Lung CA  . COPD (chronic obstructive pulmonary disease) ([place])   . Depression   . Diabetes mellitus without complication [facility])    Patient takes Janumet  . Essential hypertension 05/26/2016  . Hypercholesteremia   . Hypertension   . 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 PAD (peripheral artery disease) ([place]) 06/22/2016  . Peripheral vascular disease ([place])   . Shortness of breath dyspnea    with exertion     Past Surgical History:  Procedure Laterality Date  .  CESAREAN SECTION     x3  . COLONOSCOPY WITH PROPOFOL N/A 06/25/2015   Procedure: COLONOSCOPY WITH PROPOFOL;  Surgeon: Lucilla Lame, MD;  Location: [facility] ENDOSCOPY;  Service: Endoscopy;  Laterality: N/A;  . CYST REMOVAL LEG     and on shoulder   . LUNG BIOPSY  05 15 2013   has lung "spots"  . PERIPHERAL VASCULAR CATHETERIZATION Left 06/01/2016   Procedure: Lower Extremity Angiography;  Surgeon: Algernon Huxley, MD;  Location: [place] [facility];  Service: Cardiovascular;  Laterality: Left;  . PERIPHERAL VASCULAR CATHETERIZATION N/A 06/01/2016   Procedure: Abdominal Aortogram w/Lower Extremity;  Surgeon: Algernon Huxley, MD;  Location: [place] [facility];  Service: Cardiovascular;  Laterality: N/A;  . PERIPHERAL VASCULAR CATHETERIZATION  06/01/2016   Procedure: Lower  Extremity Intervention;  Surgeon: Algernon Huxley, MD;  Location: [place] [facility] LAB;  Service: Cardiovascular;;  . PERIPHERAL VASCULAR CATHETERIZATION Right 06/08/2016   Procedure: Lower Extremity Angiography;  Surgeon: Algernon Huxley, MD;  Location: [place] [facility] LAB;  Service: Cardiovascular;  Laterality: Right;  . PERIPHERAL VASCULAR CATHETERIZATION  06/08/2016   Procedure: Lower Extremity Intervention;  Surgeon: Algernon Huxley, MD;  Location: [place] [facility] LAB;  Service: Cardiovascular;;    Family History  Problem Relation Age of Onset  . Diabetes Mother   . Lung cancer Father   . Diabetes Sister   . Diabetes Maternal Grandmother   . Diabetes Paternal Grandmother   . Diabetes Sister      Social History       Social History  Substance Use Topics  . Smoking status: Former Smoker    Packs/day: 1.00    Years: 30.00    Types: Cigarettes    Quit date: 02/06/2010  . Smokeless tobacco: Former Systems developer    Types: Snuff  . Alcohol use No     Comment: hx of etoh use    No IV drug use  No Known Allergies     REVIEW OF SYSTEMS (Negative unless checked)  Constitutional: [] Weight loss  [] Fever  [] Chills Cardiac: [] Chest pain   [] Chest pressure   [] Palpitations   [] Shortness of breath when laying flat   [] Shortness of breath at rest   [] Shortness of breath with exertion. Vascular:  [x] Pain in legs with walking   [] Pain in legs at rest   [] Pain in legs when laying flat   [x] Claudication   [] Pain in feet when walking  [] Pain in feet at rest  [] Pain in feet when laying flat   [] History of DVT   [] Phlebitis   [] Swelling in legs   [] Varicose veins   [] Non-healing ulcers Pulmonary:   [] Uses home oxygen   [] Productive cough   [] Hemoptysis   [] Wheeze  [x] COPD   [] Asthma Neurologic:  [] Dizziness  [] Blackouts   [] Seizures   [] History of stroke   [] History of TIA  [] Aphasia   [] Temporary blindness   [] Dysphagia   [] Weakness or numbness in arms   [] Weakness or numbness in  legs Musculoskeletal:  [] Arthritis   [] Joint swelling   [] Joint pain   [] Low back pain Hematologic:  [] Easy bruising  [] Easy bleeding   [] Hypercoagulable state   [] Anemic  [] Hepatitis Gastrointestinal:  [] Blood in stool   [] Vomiting blood  [] Gastroesophageal reflux/heartburn   [] Abdominal pain Genitourinary:  [] Chronic kidney disease   [] Difficult urination  [] Frequent urination  [] Burning with urination   [] Hematuria Skin:  [] Rashes   [] Ulcers   [] Wounds Psychological:  [] History of anxiety   []   History of major depression.       Physical Examination  BP 121/80 (BP Location: Left Arm)   Pulse 90   Resp 17   Ht 4\' 11"  (1.499 m)   Wt 116 lb 3.2 oz (52.7 kg)   BMI 23.47 kg/m  Gen:  WD/WN, NAD Head: [place]/AT, No temporalis wasting. Ear/Nose/Throat: Hearing grossly intact, nares w/o erythema or drainage, trachea midline Eyes: Conjunctiva clear. Sclera non-icteric Neck: Supple.  No JVD.  Pulmonary:  Good air movement, no use of accessory muscles.  Cardiac: RRR, normal S1, S2 Vascular:  Vessel Right Left  Radial Palpable Palpable                          PT Palpable 1+ Palpable  DP Palpable 1+ Palpable    Musculoskeletal: M/S 5/5 throughout.  No deformity or atrophy. No edema. Neurologic: Sensation grossly intact in extremities.  Symmetrical.  Speech is fluent.  Psychiatric: Judgment intact, Mood & affect appropriate for pt's clinical situation. Dermatologic: No rashes or ulcers noted.  No cellulitis or open wounds.       Labs No results found for this or any previous visit (from the past 2160 hour(s)).  Radiology No results found.    Assessment/Plan Essential hypertension blood pressure control important in reducing the progression of atherosclerotic disease. On appropriate oral medications.   Diabetes ([place]) blood glucose control important in reducing the progression of atherosclerotic disease. Also, involved in wound healing. On appropriate  medications.   Hyperlipidemia lipid control important in reducing the progression of atherosclerotic disease. Continue statin therapy    Atherosclerosis of native arteries of extremity with intermittent claudication (HCC) ABIs today remain normal in the right at 1.01 and stable on the left at 0.76.  Her symptoms are currently not lifestyle limiting but they are noticeable in the left leg.  At this point, I would recommend she continue her aspirin, Plavix, and statin agent.  She should continue walking and increasing her activity as much as possible.  I will plan to see her back in 6 months or sooner if problems develop in the interim.    [name], MD  09/17/2017 4:25 PM    This note was created with Dragon medical transcription system.  Any errors from dictation are purely unintentional

## 2017-09-17 NOTE — Patient Instructions (Signed)

## 2017-09-23 ENCOUNTER — Other Ambulatory Visit: Payer: Self-pay

## 2017-09-23 MED ORDER — MELOXICAM 7.5 MG PO TABS: 7.5000 mg | ORAL_TABLET | Freq: Two times a day (BID) | ORAL | 3 refills | Status: DC

## 2017-09-28 ENCOUNTER — Telehealth: Payer: Self-pay

## 2017-09-29 NOTE — Telephone Encounter (Signed)
error 

## 2017-09-30 ENCOUNTER — Other Ambulatory Visit: Payer: Self-pay

## 2017-09-30 MED ORDER — ROSUVASTATIN CALCIUM 10 MG PO TABS: 10.0000 mg | ORAL_TABLET | Freq: Every day | ORAL | 5 refills | Status: DC

## 2017-10-12 ENCOUNTER — Inpatient Hospital Stay: Payer: Medicaid Other

## 2017-10-12 ENCOUNTER — Inpatient Hospital Stay: Payer: Medicaid Other | Attending: Hematology and Oncology | Admitting: Hematology and Oncology

## 2017-10-12 ENCOUNTER — Other Ambulatory Visit: Payer: Self-pay

## 2017-10-12 ENCOUNTER — Encounter: Payer: Self-pay | Admitting: Hematology and Oncology

## 2017-10-12 VITALS — BP 112/74 | HR 90 | Temp 98.1°F | Wt 116.7 lb

## 2017-10-12 DIAGNOSIS — E1151 Type 2 diabetes mellitus with diabetic peripheral angiopathy without gangrene: Secondary | ICD-10-CM

## 2017-10-12 DIAGNOSIS — R970 Elevated carcinoembryonic antigen [CEA]: Secondary | ICD-10-CM

## 2017-10-12 DIAGNOSIS — Z7982 Long term (current) use of aspirin: Secondary | ICD-10-CM | POA: Insufficient documentation

## 2017-10-12 DIAGNOSIS — M25511 Pain in right shoulder: Secondary | ICD-10-CM | POA: Insufficient documentation

## 2017-10-12 DIAGNOSIS — Z87891 Personal history of nicotine dependence: Secondary | ICD-10-CM | POA: Insufficient documentation

## 2017-10-12 DIAGNOSIS — J449 Chronic obstructive pulmonary disease, unspecified: Secondary | ICD-10-CM | POA: Diagnosis not present

## 2017-10-12 DIAGNOSIS — Z7189 Other specified counseling: Secondary | ICD-10-CM

## 2017-10-12 DIAGNOSIS — C3411 Malignant neoplasm of upper lobe, right bronchus or lung: Secondary | ICD-10-CM | POA: Insufficient documentation

## 2017-10-12 DIAGNOSIS — Z1239 Encounter for other screening for malignant neoplasm of breast: Secondary | ICD-10-CM | POA: Insufficient documentation

## 2017-10-12 DIAGNOSIS — E7800 Pure hypercholesterolemia, unspecified: Secondary | ICD-10-CM | POA: Insufficient documentation

## 2017-10-12 DIAGNOSIS — C3491 Malignant neoplasm of unspecified part of right bronchus or lung: Secondary | ICD-10-CM

## 2017-10-12 DIAGNOSIS — I10 Essential (primary) hypertension: Secondary | ICD-10-CM | POA: Diagnosis not present

## 2017-10-12 DIAGNOSIS — Z79899 Other long term (current) drug therapy: Secondary | ICD-10-CM | POA: Diagnosis not present

## 2017-10-12 DIAGNOSIS — Z9981 Dependence on supplemental oxygen: Secondary | ICD-10-CM

## 2017-10-12 LAB — COMPREHENSIVE METABOLIC PANEL
ALK PHOS: 69 U/L (ref 38–126)
ALT: 22 U/L (ref 14–54)
ANION GAP: 8 (ref 5–15)
AST: 25 U/L (ref 15–41)
Albumin: 4.5 g/dL (ref 3.5–5.0)
BILIRUBIN TOTAL: 0.5 mg/dL (ref 0.3–1.2)
BUN: 25 mg/dL — ABNORMAL HIGH (ref 6–20)
CALCIUM: 9.4 mg/dL (ref 8.9–10.3)
CO2: 31 mmol/L (ref 22–32)
Chloride: 102 mmol/L (ref 101–111)
Creatinine, Ser: 0.99 mg/dL (ref 0.44–1.00)
GFR, EST NON AFRICAN AMERICAN: 60 mL/min — AB (ref >60)
Glucose, Bld: 85 mg/dL (ref 65–99)
POTASSIUM: 3.6 mmol/L (ref 3.5–5.1)
Sodium: 141 mmol/L (ref 135–145)
TOTAL PROTEIN: 7.9 g/dL (ref 6.5–8.1)

## 2017-10-12 LAB — CBC WITH DIFFERENTIAL/PLATELET
BASOS ABS: 0 10*3/uL (ref 0–0.1)
BASOS PCT: 1 %
Eosinophils Absolute: 0.1 10*3/uL (ref 0–0.7)
Eosinophils Relative: 2 %
HEMATOCRIT: 38 % (ref 35.0–47.0)
HEMOGLOBIN: 12.4 g/dL (ref 12.0–16.0)
Lymphocytes Relative: 21 %
Lymphs Abs: 1.2 10*3/uL (ref 1.0–3.6)
MCH: 29 pg (ref 26.0–34.0)
MCHC: 32.6 g/dL (ref 32.0–36.0)
MCV: 88.9 fL (ref 80.0–100.0)
MONO ABS: 0.5 10*3/uL (ref 0.2–0.9)
Monocytes Relative: 10 %
NEUTROS ABS: 3.8 10*3/uL (ref 1.4–6.5)
NEUTROS PCT: 66 %
Platelets: 257 10*3/uL (ref 150–440)
RBC: 4.28 MIL/uL (ref 3.80–5.20)
RDW: 13.9 % (ref 11.5–14.5)
WBC: 5.8 10*3/uL (ref 3.6–11.0)

## 2017-10-12 NOTE — Progress Notes (Signed)
[facility] day:  10/12/2017   Chief Complaint: Angela Jensen is a 62 y.o. female with a history of stage I lung cancer who is seen for reassessment.   HPI: The patient was last seen in the medical oncology clinic on 08/31/2017.  At that time, she felt "pretty good".  She had stable exertional dyspnea.  Exam was stable. She was scheduled to have a CT guided biopsy.  To date, patient has not had the ordered CT guided biopsy. Patient notes that Dr. Baruch Gouty advised her that the biopsy could not be done citing the fact that her lesion was too small.  Initial plan was based on a possible non-small cell carcinoma, which would be treated with SBRT once the diagnosis was confirmed.   During the interim, patient is doing well overall. She denies increased shortness of breath at rest at rest. She does experience exertional dyspnea. Patient notes that her cough and hemoptysis have resolved. She has intermittent pain in her RIGHT shoulder.   She denies any B symptoms or recent infections.    Past Medical History:  Diagnosis Date  . Arthritis   . Asthma   . Atherosclerosis of native arteries of extremity with intermittent claudication ([place]) 05/26/2016  . Benign neoplasm of sigmoid colon   . Cancer [facility]) 2012   Right Lung CA  . COPD (chronic obstructive pulmonary disease) ([place])   . Depression   . Diabetes mellitus without complication [facility])    Patient takes Janumet  . Essential hypertension 05/26/2016  . Hypercholesteremia   . Hypertension   . 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 PAD (peripheral artery disease) ([place]) 06/22/2016  . Peripheral vascular disease ([place])   . Shortness of breath dyspnea    with exertion     Past Surgical History:  Procedure Laterality Date  . CESAREAN SECTION     x3  . COLONOSCOPY WITH PROPOFOL N/A 06/25/2015   Procedure: COLONOSCOPY WITH PROPOFOL;  Surgeon: Lucilla Lame, MD;  Location: [facility] ENDOSCOPY;  Service: Endoscopy;   Laterality: N/A;  . CYST REMOVAL LEG     and on shoulder   . LUNG BIOPSY  05 15 2013   has lung "spots"  . PERIPHERAL VASCULAR CATHETERIZATION Left 06/01/2016   Procedure: Lower Extremity Angiography;  Surgeon: Algernon Huxley, MD;  Location: [place] [facility];  Service: Cardiovascular;  Laterality: Left;  . PERIPHERAL VASCULAR CATHETERIZATION N/A 06/01/2016   Procedure: Abdominal Aortogram w/Lower Extremity;  Surgeon: Algernon Huxley, MD;  Location: [place] [facility];  Service: Cardiovascular;  Laterality: N/A;  . PERIPHERAL VASCULAR CATHETERIZATION  06/01/2016   Procedure: Lower Extremity Intervention;  Surgeon: Algernon Huxley, MD;  Location: [place] [facility];  Service: Cardiovascular;;  . PERIPHERAL VASCULAR CATHETERIZATION Right 06/08/2016   Procedure: Lower Extremity Angiography;  Surgeon: Algernon Huxley, MD;  Location: [place] [facility];  Service: Cardiovascular;  Laterality: Right;  . PERIPHERAL VASCULAR CATHETERIZATION  06/08/2016   Procedure: Lower Extremity Intervention;  Surgeon: Algernon Huxley, MD;  Location: [place] [facility];  Service: Cardiovascular;;    Family History  Problem Relation Age of Onset  . Lung cancer Father   . Diabetes Mother   . Diabetes Sister   . Diabetes Maternal Grandmother   . Diabetes Paternal Grandmother   . Diabetes Sister     Social History:  reports that she quit smoking about 7 years ago. Her smoking use included  cigarettes. She has a 33.30 pack-year smoking history. She has quit using smokeless tobacco. Her smokeless tobacco use included snuff. She reports that she uses drugs. Drug: Marijuana. She reports that she does not drink alcohol.  She has not smoked for 1 month.  She started smoking at 17 until she was "50 something".  She lives in [place].  The patient is alone today.  Allergies: No Known Allergies  Current Medications: Current Outpatient Medications  Medication Sig Dispense Refill  . albuterol (PROAIR HFA) 108 (90 BASE) MCG/ACT  inhaler Inhale 2 puffs into the lungs every 4 (four) hours as needed for wheezing or shortness of breath.     . albuterol (PROVENTIL) (2.5 MG/3ML) 0.083% nebulizer solution Take 2.5 mg by nebulization every 6 (six) hours as needed for wheezing.     . amLODipine (NORVASC) 10 MG tablet Take 1 tablet (10 mg total) by mouth every morning. 30 tablet 5  . aspirin EC 81 MG tablet Take 1 tablet (81 mg total) by mouth daily. 150 tablet 2  . buPROPion (WELLBUTRIN) 75 MG tablet Take 75 mg by mouth 2 (two) times daily.    . clopidogrel (PLAVIX) 75 MG tablet TAKE ONE TABLET BY MOUTH ONCE DAILY 30 tablet 11  . gabapentin (NEURONTIN) 100 MG capsule Take 100 mg by mouth 2 (two) times daily.    . glucose blood (ACCU-CHEK AVIVA PLUS) test strip Use as instructed to check blood sugars three times daily.  E11.65 250 each 5  . lisinopril-hydrochlorothiazide (PRINZIDE,ZESTORETIC) 10-12.5 MG per tablet Take 1 tablet by mouth every morning.    . meloxicam (MOBIC) 7.5 MG tablet Take 1 tablet (7.5 mg total) by mouth 2 (two) times daily. 60 tablet 3  . mometasone-formoterol (DULERA) 200-5 MCG/ACT AERO Inhale 1 puff into the lungs 2 (two) times daily. 1 Inhaler 5  . OXYGEN Inhale 2 L into the lungs at bedtime.    . potassium chloride (K-DUR) 10 MEQ tablet TAKE 1 TABLET BY MOUTH EVERY OTHER DAY FOR  LOW  POTASSIUM 15 tablet 2  . rOPINIRole (REQUIP) 0.5 MG tablet Take 0.5 mg by mouth 2 (two) times daily.     . rosuvastatin (CRESTOR) 10 MG tablet Take 1 tablet (10 mg total) by mouth at bedtime. 30 tablet 5  . SitaGLIPtin-MetFORMIN HCl (JANUMET XR) 50-500 MG TB24 Take 1 tablet by mouth daily with breakfast.     . tiotropium (SPIRIVA HANDIHALER) 18 MCG inhalation capsule Place 1 capsule (18 mcg total) into inhaler and inhale daily. 30 capsule 5   No current facility-administered medications for this visit.     Review of Systems:  GENERAL:  Feels "fine".  No fevers or sweats.  Weight stable. PERFORMANCE STATUS (ECOG):   1 HEENT:  No visual changes, runny nose, sore throat, mouth sores or tenderness. Lungs: Exertional dyspnea. No increased shortness of breath or cough.  No hemoptysis. Cardiac:  No chest pain, palpitations, orthopnea, or PND. GI:   No nausea, vomiting, diarrhea, constipation, melena or hematochezia.  Prior colonoscopy. GU:  No urgency, frequency, dysuria, or hematuria. Musculoskeletal:  No back pain.  Right shoulder pain.  No muscle tenderness. Extremities:  No pain or swelling. Skin:  No rashes or skin changes. Neuro:  No headache, numbness or weakness, balance or coordination issues. Endocrine:  Diabetes, under better control.  No thyroid issues, hot flashes or night sweats. Psych:  No mood changes, depression or anxiety. Pain:  No focal pain. Review of systems:  All other systems reviewed and found   to be negative.  Physical Exam: Blood pressure 112/74, pulse 90, temperature 98.1 F (36.7 C), temperature source Tympanic, weight 116 lb 11.2 oz (52.9 kg). GENERAL:  Thin woman sitting comfortably in the exam room in no acute distress. MENTAL STATUS:  Alert and oriented to person, place and time. HEAD:  Shoulder length gray hair pulled back.  Normocephalic, atraumatic, face symmetric, no Cushingoid features. EYES:  Glasses. Brown eyes.  Pupils equal round and reactive to light and accomodation.  No conjunctivitis or scleral icterus. ENT:  Oropharynx clear without lesion.  Upper dentures. Tongue normal. Mucous membranes moist.  RESPIRATORY:  Clear to auscultation without rales, wheezes or rhonchi. CARDIOVASCULAR:  Regular rate and rhythm without murmur, rub or gallop. ABDOMEN:  Soft, non-tender, with active bowel sounds, and no hepatosplenomegaly.  No masses. SKIN:  No rashes, ulcers or lesions. EXTREMITIES: Trace non-pitting edema in lower extremities. No skin discoloration or tenderness.  No palpable cords. LYMPH NODES: No palpable cervical, supraclavicular, axillary or inguinal adenopathy   NEUROLOGICAL: Unremarkable. PSYCH:  Appropriate.   No visits with results within 3 Day(s) from this visit.  Latest known visit with results is:  Appointment on 02/04/2017  Component Date Value Ref Range Status  . WBC 02/04/2017 4.9  3.6 - 11.0 K/uL Final  . RBC 02/04/2017 4.18  3.80 - 5.20 MIL/uL Final  . Hemoglobin 02/04/2017 12.5  12.0 - 16.0 g/dL Final  . HCT 02/04/2017 36.6  35.0 - 47.0 % Final  . MCV 02/04/2017 87.6  80.0 - 100.0 fL Final  . MCH 02/04/2017 29.8  26.0 - 34.0 pg Final  . MCHC 02/04/2017 34.0  32.0 - 36.0 g/dL Final  . RDW 02/04/2017 13.3  11.5 - 14.5 % Final  . Platelets 02/04/2017 235  150 - 440 K/uL Final  . Neutrophils Relative % 02/04/2017 61  % Final  . Neutro Abs 02/04/2017 3.1  1.4 - 6.5 K/uL Final  . Lymphocytes Relative 02/04/2017 27  % Final  . Lymphs Abs 02/04/2017 1.3  1.0 - 3.6 K/uL Final  . Monocytes Relative 02/04/2017 9  % Final  . Monocytes Absolute 02/04/2017 0.4  0.2 - 0.9 K/uL Final  . Eosinophils Relative 02/04/2017 2  % Final  . Eosinophils Absolute 02/04/2017 0.1  0 - 0.7 K/uL Final  . Basophils Relative 02/04/2017 1  % Final  . Basophils Absolute 02/04/2017 0.0  0 - 0.1 K/uL Final  . Sodium 02/04/2017 133* 135 - 145 mmol/L Final  . Potassium 02/04/2017 3.8  3.5 - 5.1 mmol/L Final  . Chloride 02/04/2017 97* 101 - 111 mmol/L Final  . CO2 02/04/2017 27  22 - 32 mmol/L Final  . Glucose, Bld 02/04/2017 85  65 - 99 mg/dL Final  . BUN 02/04/2017 16  6 - 20 mg/dL Final  . Creatinine, Ser 02/04/2017 0.86  0.44 - 1.00 mg/dL Final  . Calcium 02/04/2017 9.3  8.9 - 10.3 mg/dL Final  . Total Protein 02/04/2017 7.7  6.5 - 8.1 g/dL Final  . Albumin 02/04/2017 4.4  3.5 - 5.0 g/dL Final  . AST 02/04/2017 25  15 - 41 U/L Final  . ALT 02/04/2017 16  14 - 54 U/L Final  . Alkaline Phosphatase 02/04/2017 57  38 - 126 U/L Final  . Total Bilirubin 02/04/2017 0.6  0.3 - 1.2 mg/dL Final  . GFR calc non Af Amer 02/04/2017 >60  >60 mL/min Final  . GFR calc  Af Amer 02/04/2017 >60  >60 mL/min Final   Comment: (NOTE)   The eGFR has been calculated using the CKD EPI equation. This calculation has not been validated in all clinical situations. eGFR's persistently <60 mL/min signify possible Chronic Kidney Disease.   . Anion gap 02/04/2017 9  5 - 15 Final    Assessment:  Katesha L Pasqua is a 62 y.o. 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   She was not a good surgery candidate secondary to her lung function (FEV1 28%).  Chest CT on 07/31/2011 revealed a 1.1 x 0.6 cm RUL nodule and a 9 mm inferior aspect RUL nodule and a 4 mm nodule in the anterior aspect of the inferior aspect of the RUL.  PET scan on 08/03/2011 revealed a 10-mm spiculated right apical pulmonary nodule (SUV 2.5) suspcious for lung cancer. Left ovary was prominent (3.9 x 2.4 cm).   CA-125 was 9.3 (normal).  CT guided biopsy of the superior and inferior RUL nodules on 12/30/2011 revealed well differentiated adenocarcinoma of lung primary.  EGFR testing revealed no mutation.  She received IMRT 6000 cGy to both nodules from 01/26/2012 - 03/10/2012.  PET scan on 01/30/2015 revealed a hypermetabolic and enlarging RUL pulmonary nodules concerning for lung cancer recurrence.  There was a small focus of peripheral nodular consolidation in the superior segment of the right lower lobe with differential including inflammation/infection versus neoplasm.  There was no evidence of mediastinal nodal metastasis.    PET scan on 03/27/2016 revealed no hypermetabolic activity to suggest recurrent malignancy. There was a soft tissue density in the left pelvis near the inguinal ring that was not hypermetabolic, but prominent and probably separate from the left ovary. This could be a wandering fibroid.  Although the overall appearance had not changed from 2012 and was not hypermetabolic, pelvic sonography or pelvic MRI for further characterization of the uterus and  ovaries can be considered.  In 04/2015, there was an attempt at radiofrequency ablation of 1 enlarging RUL nodule.  At the time of the procedure, the nodule had shrunk to 5 mm from 9 mm.    Chest CT on 08/01/2015 revealed complete resolution of the prior nodule in the right lung apex.   Chest CT on 01/31/2016 revealed no active cardiopulmonary abnormalities.  There was stable appearance of right apical and right middle lobe scar like densities.   Low dose chest CT on 02/02/2017 revealed new ground-glass nodule in the right lower lobe. Lung-RADS 3 (probably benign findings).   Low dose chest CT  on 07/29/2017 demonstrated enlargement of the previously observed sub-solid pulmonary lesion. RIGHT upper lobe lesion measured 1.4 x 2.1 x 1.6 cm (previously 1.5 x 1.1 x 1.7 cm). There was also areas of focal architectural distortion in the RIGHT upper and middle lobes that were felt to be most compatible with areas of chronic post infectious or inflammatory scarring. Aortic value calcifications were noted. Lung-RADS 4BS (suspicious).   She has an elevated CEA of unclear etiology.  CEA was 8.1 on 01/31/2016 and 9.2 on 03/03/2016.  Colonoscopy on 06/25/2015 revealed a 4 mm polyp in the sigmoid colon.  Pathology revealed a tubular adenoma without evidence of dysplasia or malignancy.  Pelvic MRI on 04/30/2016 revealed a 3.4 x 2.5 cm ovoid mass in the LEFT adnexa with signal characteristics identical to the intramural leiomyomas and consistent with an exophytic leiomyoma.  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  Endometrium was normal with a normal junctional zone.  Ovaries were not identified.  Symptomatically, she feels "  fine".  She has stable exertional dyspnea.  Exam is stable.   Plan: 1.  Labs today: CBC with diff, CMP, CEA 2.  Discuss interval events and inability to obtain CT guided biopsy per Dr. Yamagata, radiologist.  Discuss continued monitoring. 3.  Schedule follow up chest CT without contrast on  12/15/2017. 4.  RTC after CT for MD assessment and review of scans.     Bryan Gray, NP  10/12/2017, 11:50 AM   I saw and evaluated the patient, participating in the key portions of the service and reviewing pertinent diagnostic studies and records.  I reviewed the nurse practitioner's note and agree with the findings and the plan.  The assessment and plan were discussed with the patient.  Several questions were asked by the patient and answered.   Melissa Corcoran, MD 10/12/2017, 11:50 AM  

## 2017-10-13 LAB — CEA: CEA1: 7.9 ng/mL — AB (ref 0.0–4.7)

## 2017-10-28 ENCOUNTER — Ambulatory Visit: Payer: Self-pay | Admitting: Internal Medicine

## 2017-11-15 ENCOUNTER — Other Ambulatory Visit: Payer: Self-pay

## 2017-11-15 MED ORDER — GABAPENTIN 100 MG PO CAPS: 100.0000 mg | ORAL_CAPSULE | Freq: Two times a day (BID) | ORAL | 5 refills | Status: DC

## 2017-11-15 MED ORDER — ROPINIROLE HCL 0.5 MG PO TABS: 0.5000 mg | ORAL_TABLET | Freq: Two times a day (BID) | ORAL | 2 refills | Status: DC

## 2017-11-19 ENCOUNTER — Telehealth: Payer: Self-pay | Admitting: Nurse Practitioner

## 2017-11-19 NOTE — Telephone Encounter (Signed)
CALLED PATIENT TO LET HER KNOW THAT MEDICAID IS NOT COVERING JANUMET , HER PLAN DOES NOT COVER THIS MEDICATION .

## 2017-11-22 ENCOUNTER — Other Ambulatory Visit: Payer: Self-pay

## 2017-11-22 MED ORDER — GABAPENTIN 100 MG PO CAPS: 100.0000 mg | ORAL_CAPSULE | Freq: Two times a day (BID) | ORAL | 5 refills | Status: DC

## 2017-12-15 ENCOUNTER — Telehealth: Payer: Self-pay | Admitting: *Deleted

## 2017-12-15 ENCOUNTER — Other Ambulatory Visit: Payer: Self-pay | Admitting: Internal Medicine

## 2017-12-15 ENCOUNTER — Other Ambulatory Visit: Payer: Self-pay

## 2017-12-15 ENCOUNTER — Ambulatory Visit: Admission: RE | Admit: 2017-12-15 | Payer: BLUE CROSS/BLUE SHIELD | Source: Ambulatory Visit

## 2017-12-15 MED ORDER — POTASSIUM CHLORIDE ER 10 MEQ PO TBCR: EXTENDED_RELEASE_TABLET | ORAL | 2 refills | Status: DC

## 2017-12-15 NOTE — Telephone Encounter (Signed)
Patient scheduled for CT today.  Patient no showed appointment.  I called to inquire as to why she did not go for appointment.  She states she has lost her Medicaid.  She is awaiting [employer] and asked that we reschedule her CT scan.  Will notify scheduling to reschedule.

## 2017-12-15 NOTE — Telephone Encounter (Signed)
-----   Message from Lequita Asal, MD sent at 12/15/2017  2:55 PM EDT ----- Regarding: FW: CT no show Contact: [phone]  Please call patient about missed CT.  M  ----- Message ----- From: Wilburn Cornelia Sent: 12/15/2017   2:17 PM To: Lequita Asal, MD, Shirlean Kelly, RN Subject: CT no show                                     Megan from CT called and pt was a No Show today

## 2017-12-20 ENCOUNTER — Telehealth: Payer: Self-pay | Admitting: *Deleted

## 2017-12-20 ENCOUNTER — Ambulatory Visit: Payer: Medicaid Other

## 2017-12-20 NOTE — Telephone Encounter (Signed)
Called patient to inquire if she went for CT scan.  Patient was scheduled in February and did not go.  She has appointment in clinic tomorrow for f/u following her scan.  Left message for her to call me regarding the scan and tomorrow's appointment.

## 2017-12-21 ENCOUNTER — Telehealth: Payer: Self-pay | Admitting: *Deleted

## 2017-12-21 ENCOUNTER — Inpatient Hospital Stay: Payer: Self-pay | Admitting: Hematology and Oncology

## 2017-12-21 NOTE — Progress Notes (Deleted)
[facility] day:  12/21/2017   Chief Complaint: Angela Jensen is a 62 y.o. female with a history of stage I lung cancer who is seen for review of interval chest CT and 3 month assessment.   HPI: The patient was last seen in the medical oncology clinic on 10/12/2017.  At that time, she felt "fine".  She had stable exertional dyspnea.  Exam was stable.  Interventional radiology had noted inability to obtain a biopsy.  We discussed ongoing monitoring.    Past Medical History:  Diagnosis Date  . Arthritis   . Asthma   . Atherosclerosis of native arteries of extremity with intermittent claudication ([place]) 05/26/2016  . Benign neoplasm of sigmoid colon   . Cancer [facility]) 2012   Right Lung CA  . COPD (chronic obstructive pulmonary disease) ([place])   . Depression   . Diabetes mellitus without complication [facility])    Patient takes Janumet  . Essential hypertension 05/26/2016  . Hypercholesteremia   . Hypertension   . 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 PAD (peripheral artery disease) ([place]) 06/22/2016  . Peripheral vascular disease ([place])   . Shortness of breath dyspnea    with exertion     Past Surgical History:  Procedure Laterality Date  . CESAREAN SECTION     x3  . COLONOSCOPY WITH PROPOFOL N/A 06/25/2015   Procedure: COLONOSCOPY WITH PROPOFOL;  Surgeon: Lucilla Lame, MD;  Location: [facility] ENDOSCOPY;  Service: Endoscopy;  Laterality: N/A;  . CYST REMOVAL LEG     and on shoulder   . LUNG BIOPSY  05 15 2013   has lung "spots"  . PERIPHERAL VASCULAR CATHETERIZATION Left 06/01/2016   Procedure: Lower Extremity Angiography;  Surgeon: Algernon Huxley, MD;  Location: [place] [facility];  Service: Cardiovascular;  Laterality: Left;  . PERIPHERAL VASCULAR CATHETERIZATION N/A 06/01/2016   Procedure: Abdominal Aortogram w/Lower Extremity;  Surgeon: Algernon Huxley, MD;  Location: [place] [facility];  Service: Cardiovascular;  Laterality: N/A;  . PERIPHERAL  VASCULAR CATHETERIZATION  06/01/2016   Procedure: Lower Extremity Intervention;  Surgeon: Algernon Huxley, MD;  Location: [place] [facility];  Service: Cardiovascular;;  . PERIPHERAL VASCULAR CATHETERIZATION Right 06/08/2016   Procedure: Lower Extremity Angiography;  Surgeon: Algernon Huxley, MD;  Location: [place] [facility];  Service: Cardiovascular;  Laterality: Right;  . PERIPHERAL VASCULAR CATHETERIZATION  06/08/2016   Procedure: Lower Extremity Intervention;  Surgeon: Algernon Huxley, MD;  Location: [place] [facility];  Service: Cardiovascular;;    Family History  Problem Relation Age of Onset  . Lung cancer Father   . Diabetes Mother   . Diabetes Sister   . Diabetes Maternal Grandmother   . Diabetes Paternal Grandmother   . Diabetes Sister     Social History:  reports that she quit smoking about 7 years ago. Her smoking use included cigarettes. She has a 33.30 pack-year smoking history. She has quit using smokeless tobacco. Her smokeless tobacco use included snuff. She reports that she has current or past drug history. Drug: Marijuana. She reports that she does not drink alcohol.  She has not smoked for 1 month.  She started smoking at 17 until she was "29 something".  She lives in [place].  The patient is alone today.  Allergies: No Known Allergies  Current Medications: Current Outpatient Medications  Medication Sig Dispense Refill  . albuterol (PROAIR HFA) 108 (90 BASE) MCG/ACT inhaler  Inhale 2 puffs into the lungs every 4 (four) hours as needed for wheezing or shortness of breath.     Marland Kitchen albuterol (PROVENTIL) (2.5 MG/3ML) 0.083% nebulizer solution Take 2.5 mg by nebulization every 6 (six) hours as needed for wheezing.     Marland Kitchen amLODipine (NORVASC) 10 MG tablet Take 1 tablet (10 mg total) by mouth every morning. 30 tablet 5  . aspirin EC 81 MG tablet Take 1 tablet (81 mg total) by mouth daily. 150 tablet 2  . buPROPion (WELLBUTRIN XL) 150 MG 24 hr tablet TAKE 1 TABLET BY MOUTH TWICE  DAILY FOR SMOKING CESSATION 60 tablet 6  . clopidogrel (PLAVIX) 75 MG tablet TAKE ONE TABLET BY MOUTH ONCE DAILY 30 tablet 11  . gabapentin (NEURONTIN) 100 MG capsule Take 1 capsule (100 mg total) by mouth 2 (two) times daily. 60 capsule 5  . glucose blood (ACCU-CHEK AVIVA PLUS) test strip Use as instructed to check blood sugars three times daily.  E11.65 250 each 5  . lisinopril-hydrochlorothiazide (PRINZIDE,ZESTORETIC) 10-12.5 MG per tablet Take 1 tablet by mouth every morning.    . meloxicam (MOBIC) 7.5 MG tablet Take 1 tablet (7.5 mg total) by mouth 2 (two) times daily. 60 tablet 3  . mometasone-formoterol (DULERA) 200-5 MCG/ACT AERO Inhale 1 puff into the lungs 2 (two) times daily. 1 Inhaler 5  . OXYGEN Inhale 2 L into the lungs at bedtime.    . potassium chloride (K-DUR) 10 MEQ tablet TAKE 1 TABLET BY MOUTH EVERY OTHER DAY FOR  LOW  POTASSIUM 15 tablet 2  . rOPINIRole (REQUIP) 0.5 MG tablet Take 1 tablet (0.5 mg total) by mouth 2 (two) times daily. 60 tablet 2  . rosuvastatin (CRESTOR) 10 MG tablet Take 1 tablet (10 mg total) by mouth at bedtime. 30 tablet 5  . SitaGLIPtin-MetFORMIN HCl (JANUMET XR) 50-500 MG TB24 Take 1 tablet by mouth daily with breakfast.     . tiotropium (SPIRIVA HANDIHALER) 18 MCG inhalation capsule Place 1 capsule (18 mcg total) into inhaler and inhale daily. 30 capsule 5   No current facility-administered medications for this visit.     Review of Systems:  GENERAL:  Feels "fine".  No fevers or sweats.  Weight stable. PERFORMANCE STATUS (ECOG):  1 HEENT:  No visual changes, runny nose, sore throat, mouth sores or tenderness. Lungs: Exertional dyspnea. No increased shortness of breath or cough.  No hemoptysis. Cardiac:  No chest pain, palpitations, orthopnea, or PND. GI:   No nausea, vomiting, diarrhea, constipation, melena or hematochezia.  Prior colonoscopy. GU:  No urgency, frequency, dysuria, or hematuria. Musculoskeletal:  No back pain.  Right shoulder pain.   No muscle tenderness. Extremities:  No pain or swelling. Skin:  No rashes or skin changes. Neuro:  No headache, numbness or weakness, balance or coordination issues. Endocrine:  Diabetes, under better control.  No thyroid issues, hot flashes or night sweats. Psych:  No mood changes, depression or anxiety. Pain:  No focal pain. Review of systems:  All other systems reviewed and found to be negative.  GENERAL:  Feels good.  Active.  No fevers, sweats or weight loss. PERFORMANCE STATUS (ECOG):  *** HEENT:  No visual changes, runny nose, sore throat, mouth sores or tenderness. Lungs: No shortness of breath or cough.  No hemoptysis. Cardiac:  No chest pain, palpitations, orthopnea, or PND. GI:  No nausea, vomiting, diarrhea, constipation, melena or hematochezia. GU:  No urgency, frequency, dysuria, or hematuria. Musculoskeletal:  No back pain.  No joint  pain.  No muscle tenderness. Extremities:  No pain or swelling. Skin:  No rashes or skin changes. Neuro:  No headache, numbness or weakness, balance or coordination issues. Endocrine:  No diabetes, thyroid issues, hot flashes or night sweats. Psych:  No mood changes, depression or anxiety. Pain:  No focal pain. Review of systems:  All other systems reviewed and found to be negative.   Physical Exam: There were no vitals taken for this visit. GENERAL:  Thin woman sitting comfortably in the exam room in no acute distress. MENTAL STATUS:  Alert and oriented to person, place and time. HEAD:  Shoulder length gray hair pulled back.  Normocephalic, atraumatic, face symmetric, no Cushingoid features. EYES:  Glasses. Brown eyes.  Pupils equal round and reactive to light and accomodation.  No conjunctivitis or scleral icterus. ENT:  Oropharynx clear without lesion.  Upper dentures. Tongue normal. Mucous membranes moist.  RESPIRATORY:  Clear to auscultation without rales, wheezes or rhonchi. CARDIOVASCULAR:  Regular rate and rhythm without murmur,  rub or gallop. ABDOMEN:  Soft, non-tender, with active bowel sounds, and no hepatosplenomegaly.  No masses. SKIN:  No rashes, ulcers or lesions. EXTREMITIES: Trace non-pitting edema in lower extremities. No skin discoloration or tenderness.  No palpable cords. LYMPH NODES: No palpable cervical, supraclavicular, axillary or inguinal adenopathy  NEUROLOGICAL: Unremarkable. PSYCH:  Appropriate.  GENERAL:  Well developed, well nourished, woman sitting comfortably in the exam room in no acute distress. MENTAL STATUS:  Alert and oriented to person, place and time. HEAD:  *** hair.  Normocephalic, atraumatic, face symmetric, no Cushingoid features. EYES:  *** eyes.  Pupils equal round and reactive to light and accomodation.  No conjunctivitis or scleral icterus. ENT:  Oropharynx clear without lesion.  Tongue normal. Mucous membranes moist.  RESPIRATORY:  Clear to auscultation without rales, wheezes or rhonchi. CARDIOVASCULAR:  Regular rate and rhythm without murmur, rub or gallop. BREAST:  Right breast without masses, skin changes or nipple discharge.  Left breast without masses, skin changes or nipple discharge. *** ABDOMEN:  Soft, non-tender, with active bowel sounds, and no hepatosplenomegaly.  No masses. SKIN:  No rashes, ulcers or lesions. EXTREMITIES: No edema, no skin discoloration or tenderness.  No palpable cords. LYMPH NODES: No palpable cervical, supraclavicular, axillary or inguinal adenopathy  NEUROLOGICAL: Unremarkable. PSYCH:  Appropriate.    No visits with results within 3 Day(s) from this visit.  Latest known visit with results is:  Appointment on 10/12/2017  Component Date Value Ref Range Status  . Sodium 10/12/2017 141  135 - 145 mmol/L Final  . Potassium 10/12/2017 3.6  3.5 - 5.1 mmol/L Final  . Chloride 10/12/2017 102  101 - 111 mmol/L Final  . CO2 10/12/2017 31  22 - 32 mmol/L Final  . Glucose, Bld 10/12/2017 85  65 - 99 mg/dL Final  . BUN 10/12/2017 25* 6 - 20 mg/dL  Final  . Creatinine, Ser 10/12/2017 0.99  0.44 - 1.00 mg/dL Final  . Calcium 10/12/2017 9.4  8.9 - 10.3 mg/dL Final  . Total Protein 10/12/2017 7.9  6.5 - 8.1 g/dL Final  . Albumin 10/12/2017 4.5  3.5 - 5.0 g/dL Final  . AST 10/12/2017 25  15 - 41 U/L Final  . ALT 10/12/2017 22  14 - 54 U/L Final  . Alkaline Phosphatase 10/12/2017 69  38 - 126 U/L Final  . Total Bilirubin 10/12/2017 0.5  0.3 - 1.2 mg/dL Final  . GFR calc non Af Amer 10/12/2017 60* >60 mL/min Final  .  GFR calc Af Amer 10/12/2017 >60  >60 mL/min Final   Comment: (NOTE) The eGFR has been calculated using the CKD EPI equation. This calculation has not been validated in all clinical situations. eGFR's persistently <60 mL/min signify possible Chronic Kidney Disease.   Georgiann Hahn gap 10/12/2017 8  5 - 15 Final   Performed at [facility], [address] [place], [place] [address]  . CEA 10/12/2017 7.9* 0.0 - 4.7 ng/mL Final   Comment: (NOTE)                             Nonsmokers          <3.9                             Smokers             <5.6 Roche [employer] Electrochemiluminescence Immunoassay (ECLIA) Values obtained with different assay methods or kits cannot be used interchangeably.  Results cannot be interpreted as absolute evidence of the presence or absence of malignant disease. Performed At: [facility] [place], [place] [id number] Rush Farmer MD QM:[id number] Performed at [facility], [address] [place], [place] [address]   . WBC 10/12/2017 5.8  3.6 - 11.0 K/uL Final  . RBC 10/12/2017 4.28  3.80 - 5.20 MIL/uL Final  . Hemoglobin 10/12/2017 12.4  12.0 - 16.0 g/dL Final  . HCT 10/12/2017 38.0  35.0 - 47.0 % Final  . MCV 10/12/2017 88.9  80.0 - 100.0 fL Final  . MCH 10/12/2017 29.0  26.0 - 34.0 pg Final  . MCHC 10/12/2017 32.6  32.0 - 36.0 g/dL Final  . RDW 10/12/2017 13.9  11.5 - 14.5 % Final  . Platelets 10/12/2017 257  150 - 440 K/uL Final  . Neutrophils  Relative % 10/12/2017 66  % Final  . Neutro Abs 10/12/2017 3.8  1.4 - 6.5 K/uL Final  . Lymphocytes Relative 10/12/2017 21  % Final  . Lymphs Abs 10/12/2017 1.2  1.0 - 3.6 K/uL Final  . Monocytes Relative 10/12/2017 10  % Final  . Monocytes Absolute 10/12/2017 0.5  0.2 - 0.9 K/uL Final  . Eosinophils Relative 10/12/2017 2  % Final  . Eosinophils Absolute 10/12/2017 0.1  0 - 0.7 K/uL Final  . Basophils Relative 10/12/2017 1  % Final  . Basophils Absolute 10/12/2017 0.0  0 - 0.1 K/uL Final   Performed at [facility], [address] [place], [place] [address]    Assessment:  Angela Jensen is a 62 y.o. 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   She was not a good surgery candidate secondary to her lung function (FEV1 28%).  Chest CT on 07/31/2011 revealed a 1.1 x 0.6 cm RUL nodule and a 9 mm inferior aspect RUL nodule and a 4 mm nodule in the anterior aspect of the inferior aspect of the RUL.  PET scan on 08/03/2011 revealed a 10-mm spiculated right apical pulmonary nodule (SUV 2.5) suspcious for lung cancer. Left ovary was prominent (3.9 x 2.4 cm).   CA-125 was 9.3 (normal).  CT guided biopsy of the superior and inferior RUL nodules on 12/30/2011 revealed well differentiated adenocarcinoma of lung primary.  EGFR testing revealed no mutation.  She received IMRT 6000 cGy to both nodules from 01/26/2012 - 03/10/2012.  PET scan on 01/30/2015 revealed a hypermetabolic and enlarging RUL pulmonary nodules concerning for lung cancer recurrence.  There was a small focus of peripheral nodular consolidation in the superior segment of the right lower lobe with differential including inflammation/infection versus neoplasm.  There was no evidence of mediastinal nodal metastasis.    PET scan on 03/27/2016 revealed no hypermetabolic activity to suggest recurrent malignancy. There was a soft tissue density in the left pelvis near the inguinal ring that was  not hypermetabolic, but prominent and probably separate from the left ovary. This could be a wandering fibroid.  Although the overall appearance had not changed from 2012 and was not hypermetabolic, pelvic sonography or pelvic MRI for further characterization of the uterus and ovaries can be considered.  In 04/2015, there was an attempt at radiofrequency ablation of 1 enlarging RUL nodule.  At the time of the procedure, the nodule had shrunk to 5 mm from 9 mm.    Chest CT on 08/01/2015 revealed complete resolution of the prior nodule in the right lung apex.   Chest CT on 01/31/2016 revealed no active cardiopulmonary abnormalities.  There was stable appearance of right apical and right middle lobe scar like densities.   Low dose chest CT on 02/02/2017 revealed new ground-glass nodule in the right lower lobe. Lung-RADS 3 (probably benign findings).   Low dose chest CT  on 07/29/2017 demonstrated enlargement of the previously observed sub-solid pulmonary lesion. RIGHT upper lobe lesion measured 1.4 x 2.1 x 1.6 cm (previously 1.5 x 1.1 x 1.7 cm). There was also areas of focal architectural distortion in the RIGHT upper and middle lobes that were felt to be most compatible with areas of chronic post infectious or inflammatory scarring. Aortic value calcifications were noted. Lung-RADS 4BS (suspicious).   She has an elevated CEA of unclear etiology.  CEA was 8.1 on 01/31/2016, 9.2 on 03/03/2016, and 7.9 on 10/12/2017.  Colonoscopy on 06/25/2015 revealed a 4 mm polyp in the sigmoid colon.  Pathology revealed a tubular adenoma without evidence of dysplasia or malignancy.  Pelvic MRI on 04/30/2016 revealed a 3.4 x 2.5 cm ovoid mass in the LEFT adnexa with signal characteristics identical to the intramural leiomyomas and consistent with an exophytic leiomyoma.  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  Endometrium was normal with a normal junctional zone.  Ovaries were not identified.  Symptomatically, she feels  "fine".  She has stable exertional dyspnea.  Exam is stable.   Plan: 1.    Labs today: CBC with diff, CMP, CEA 2.  Discuss interval events and inability to obtain CT guided biopsy per Dr. Kathlene Cote, radiologist.  Discuss continued monitoring. 3.  Schedule follow up chest CT without contrast on 12/15/2017. 4.  RTC after CT for MD assessment and review of scans.     Lequita Asal, MD  12/21/2017, 5:20 AM   I saw and evaluated the patient, participating in the key portions of the service and reviewing pertinent diagnostic studies and records.  I reviewed the nurse practitioner's note and agree with the findings and the plan.  The assessment and plan were discussed with the patient.  Several questions were asked by the patient and answered.   Nolon Stalls, MD 12/21/2017, 5:20 AM

## 2017-12-21 NOTE — Telephone Encounter (Signed)
Per Beverlee Nims   Scheduling Notes:  pt called this AM to cancel appt today- they have canceled her CT scan until further notice-pt stated it was due to insurance, but will re-contact us when scheduled again.

## 2017-12-27 ENCOUNTER — Ambulatory Visit: Payer: Medicaid Other | Admitting: Radiation Oncology

## 2018-01-04 ENCOUNTER — Ambulatory Visit: Payer: BLUE CROSS/BLUE SHIELD | Admitting: Internal Medicine

## 2018-01-04 ENCOUNTER — Encounter: Payer: Self-pay | Admitting: Internal Medicine

## 2018-01-04 VITALS — BP 130/80 | HR 72 | Resp 16 | Ht 59.0 in | Wt 109.4 lb

## 2018-01-04 DIAGNOSIS — R0602 Shortness of breath: Secondary | ICD-10-CM | POA: Diagnosis not present

## 2018-01-04 DIAGNOSIS — Z9981 Dependence on supplemental oxygen: Secondary | ICD-10-CM

## 2018-01-04 DIAGNOSIS — J9611 Chronic respiratory failure with hypoxia: Secondary | ICD-10-CM | POA: Diagnosis not present

## 2018-01-04 DIAGNOSIS — J449 Chronic obstructive pulmonary disease, unspecified: Secondary | ICD-10-CM

## 2018-01-04 NOTE — Patient Instructions (Signed)
Home Oxygen Use, Adult    When a medical condition keeps you from getting enough oxygen, your health care provider may instruct you to take extra oxygen at home. Your health care provider will let you know:   When to take oxygen.   For how long to take oxygen.   How quickly oxygen should be delivered (flow rate), in liters per minute (LPM or L/M).    Home oxygen can be given through:   A mask.   A nasal cannula. This is a device or tube that goes in the nostrils.   A transtracheal catheter. This is a small, flexible tube placed in the trachea.   A tracheostomy. This is a surgically made opening in the trachea.    These devices are connected with tubing to an oxygen source, such as:   A tank. Tanks hold oxygen in gas form. They must be replaced when the oxygen is used up.   A liquid oxygen device. This holds oxygen in liquid form. It must be replaced when the oxygen is used up.   An oxygen concentrator machine. This filters oxygen in the room. It uses electricity, so you must have a backup cylinder of oxygen in case the power goes out.    Supplies needed:  To use oxygen, you will need:   A mask, nasal cannula, transtracheal catheter, or tracheostomy.   An oxygen tank, a liquid oxygen device, or an oxygen concentrator.   The tape that your health care provider recommends (optional).    If you use a transtracheal catheter and your prescribed flow rate is 1 LPM or greater, you will also need a humidifier.  Risks and complications   Fire. This can happen if the oxygen is exposed to a heat source, flame, or spark.   Injury to skin. This can happen if liquid oxygen touches your skin.   Organ damage. This can happen if you get too little oxygen.  How to use oxygen    Your health care provider will show you how to use your oxygen device. Follow her or his instructions. They may look something like this:  1. Wash your hands.  2. If you use an oxygen concentrator, make sure it is plugged in.  3. Place one  end of the tube into the port on the tank, device, or machine.  4. Place the mask over your nose and mouth. Or, place the nasal cannula and secure it with tape if instructed. If you use a tracheostomy or transtracheal catheter, connect it to the oxygen source as directed.  5. Make sure the liter-flow setting on the machine is at the level prescribed by your health care provider.  6. Turn on the machine or adjust the knob on the tank or device to the correct liter-flow setting.  7. When you are done, turn off and unplug the machine, or turn the knob to OFF.    How to clean and care for the oxygen supplies  Nasal cannula   Clean it with a warm, wet cloth daily or as needed.   Wash it with a liquid soap once a week.   Rinse it thoroughly once or twice a week.   Replace it every 2-4 weeks.   If you have an infection, such as a cold or pneumonia, change the cannula when you get better.  Mask   Replace it every 2-4 weeks.   If you have an infection, such as a cold or pneumonia, change the mask when   you get better.  Humidifier bottle   Wash the bottle between each refill:  ? Wash it with soap and warm water.  ? Rinse it thoroughly.  ? Disinfect it and its top.  ? Air-dry it.   Make sure it is dry before you refill it.  Oxygen concentrator   Clean the air filter at least twice a week according to directions from your home medical equipment and service company.   Wipe down the cabinet every day. To do this:  ? Unplug the unit.  ? Wipe down the cabinet with a damp cloth.  ? Dry the cabinet.  Other equipment   Change any extra tubing every 1-3 months.   Follow instructions from your health care provider about taking care of any other equipment.  Safety tips  Fire safety tips       Keep your oxygen and oxygen supplies at least 5 ft away from sources of heat, flames, and sparks at all times.   Do not allow smoking near your oxygen. Put up "no smoking" signs in your home.   Do not use materials that can burn  (are flammable) while you use oxygen.   When you go to a restaurant with portable oxygen, ask to be seated in the nonsmoking section.   Keep a fire extinguisher close by. Let your fire department know that you have oxygen in your home.   Test your home smoke detectors regularly.  General safety tips   If you use an oxygen cylinder, make sure it is in a stand or secured to an object that will not move (fixed object).   If you use liquid oxygen, make sure its container is kept upright.   If you use an oxygen concentrator:  ? Tell your electric company. Make sure you are given priority service in the event that your power goes out.  ? Avoid using extension cords, if possible.  Follow these instructions at home:   Use oxygen only as told by your health care provider.   Do not use alcohol or other drugs that make you relax (sedating drugs) unless instructed. They can slow down your breathing rate and make it hard to get in enough oxygen.   Know how and when to order a refill of oxygen.   Always keep a spare tank of oxygen. Plan ahead for holidays when you may not be able to get a prescription filled.   Use water-based lubricants on your lips or nostrils. Do not use oil-based products like petroleum jelly.   To prevent skin irritation on your cheeks or behind your ears, tuck some gauze under the tubing.  Contact a health care provider if:   You get headaches often.   You have shortness of breath.   You have a lasting cough.   You have anxiety.   You are sleepy all the time.   You develop an illness that affects your breathing.   You cannot exercise at your regular level.   You are restless.   You have difficult or irregular breathing, and it is getting worse.   You have a fever.   You have persistent redness under your nose.  Get help right away if:   You are confused.   You have blue lips or fingernails.   You are struggling to breathe.  This information is not intended to replace advice given  to you by your health care provider. Make sure you discuss any questions you have with your health   care provider.  Document Released: 10/24/2003 Document Revised: 04/01/2016 Document Reviewed: 02/25/2016  [employer]vier Interactive Patient Education  2018 [employer]

## 2018-01-04 NOTE — Progress Notes (Signed)
[facility] [place], [place] [address]  Pulmonary Sleep Medicine   Office Visit Note  Patient Name: Angela Jensen DOB: [birth year] MRN [id number]  Date of Service: 01/04/2018  Complaints/HPI:  Patient is here for follow-up of COPD she has severe disease is oxygen dependent.  Patient uses oxygen at nighttime and she is here for require off occasion.  This meeting she will serve as a face-to-face evaluation for ongoing oxygen needs.  Patient states she does have some shortness of breath with exertion.  She denies having any chest pain no palpitations.  Patient currently states that she is on albuterol and Dulera for her inhalers.  The inhaler seem to help her and she states that she does feel better after using the inhalers.  Denies any 8 patient's to the hospital.  Denies any cough no congestion.  ROS  General: (-) fever, (-) chills, (-) night sweats, (-) weakness Skin: (-) rashes, (-) itching,. Eyes: (-) visual changes, (-) redness, (-) itching. Nose and Sinuses: (-) nasal stuffiness or itchiness, (-) postnasal drip, (-) nosebleeds, (-) sinus trouble. Mouth and Throat: (-) sore throat, (-) hoarseness. Neck: (-) swollen glands, (-) enlarged thyroid, (-) neck pain. Respiratory: - cough, (-) bloody sputum, + shortness of breath, - wheezing. Cardiovascular: - ankle swelling, (-) chest pain. Lymphatic: (-) lymph node enlargement. Neurologic: (-) numbness, (-) tingling. Psychiatric: (-) anxiety, (-) depression   Current Medication: Outpatient Encounter Medications as of 01/04/2018  Medication Sig  . albuterol (PROAIR HFA) 108 (90 BASE) MCG/ACT inhaler Inhale 2 puffs into the lungs every 4 (four) hours as needed for wheezing or shortness of breath.   Marland Kitchen albuterol (PROVENTIL) (2.5 MG/3ML) 0.083% nebulizer solution Take 2.5 mg by nebulization every 6 (six) hours as needed for wheezing.   Marland Kitchen amLODipine (NORVASC) 10 MG tablet Take 1 tablet (10 mg total) by mouth every  morning.  Marland Kitchen aspirin EC 81 MG tablet Take 1 tablet (81 mg total) by mouth daily.  Marland Kitchen buPROPion (WELLBUTRIN XL) 150 MG 24 hr tablet TAKE 1 TABLET BY MOUTH TWICE DAILY FOR SMOKING CESSATION  . clopidogrel (PLAVIX) 75 MG tablet TAKE ONE TABLET BY MOUTH ONCE DAILY  . gabapentin (NEURONTIN) 100 MG capsule Take 1 capsule (100 mg total) by mouth 2 (two) times daily.  Marland Kitchen glucose blood (ACCU-CHEK AVIVA PLUS) test strip Use as instructed to check blood sugars three times daily.  E11.65  . lisinopril-hydrochlorothiazide (PRINZIDE,ZESTORETIC) 10-12.5 MG per tablet Take 1 tablet by mouth every morning.  . meloxicam (MOBIC) 7.5 MG tablet Take 1 tablet (7.5 mg total) by mouth 2 (two) times daily.  . mometasone-formoterol (DULERA) 200-5 MCG/ACT AERO Inhale 1 puff into the lungs 2 (two) times daily.  . OXYGEN Inhale 2 L into the lungs at bedtime.  . potassium chloride (K-DUR) 10 MEQ tablet TAKE 1 TABLET BY MOUTH EVERY OTHER DAY FOR  LOW  POTASSIUM  . rOPINIRole (REQUIP) 0.5 MG tablet Take 1 tablet (0.5 mg total) by mouth 2 (two) times daily.  . rosuvastatin (CRESTOR) 10 MG tablet Take 1 tablet (10 mg total) by mouth at bedtime.  . SitaGLIPtin-MetFORMIN HCl (JANUMET XR) 50-500 MG TB24 Take 1 tablet by mouth daily with breakfast.   . tiotropium (SPIRIVA HANDIHALER) 18 MCG inhalation capsule Place 1 capsule (18 mcg total) into inhaler and inhale daily.   No facility-administered encounter medications on file as of 01/04/2018.     Surgical History: Past Surgical History:  Procedure Laterality Date  . CESAREAN SECTION  x3  . COLONOSCOPY WITH PROPOFOL N/A 06/25/2015   Procedure: COLONOSCOPY WITH PROPOFOL;  Surgeon: Lucilla Lame, MD;  Location: [facility] ENDOSCOPY;  Service: Endoscopy;  Laterality: N/A;  . CYST REMOVAL LEG     and on shoulder   . LUNG BIOPSY  05 15 2013   has lung "spots"  . PERIPHERAL VASCULAR CATHETERIZATION Left 06/01/2016   Procedure: Lower Extremity Angiography;  Surgeon: Algernon Huxley, MD;   Location: [place] [facility];  Service: Cardiovascular;  Laterality: Left;  . PERIPHERAL VASCULAR CATHETERIZATION N/A 06/01/2016   Procedure: Abdominal Aortogram w/Lower Extremity;  Surgeon: Algernon Huxley, MD;  Location: [place] [facility];  Service: Cardiovascular;  Laterality: N/A;  . PERIPHERAL VASCULAR CATHETERIZATION  06/01/2016   Procedure: Lower Extremity Intervention;  Surgeon: Algernon Huxley, MD;  Location: [place] [facility];  Service: Cardiovascular;;  . PERIPHERAL VASCULAR CATHETERIZATION Right 06/08/2016   Procedure: Lower Extremity Angiography;  Surgeon: Algernon Huxley, MD;  Location: [place] [facility];  Service: Cardiovascular;  Laterality: Right;  . PERIPHERAL VASCULAR CATHETERIZATION  06/08/2016   Procedure: Lower Extremity Intervention;  Surgeon: Algernon Huxley, MD;  Location: [place] [facility];  Service: Cardiovascular;;    Medical History: Past Medical History:  Diagnosis Date  . Arthritis   . Asthma   . Atherosclerosis of native arteries of extremity with intermittent claudication ([place]) 05/26/2016  . Benign neoplasm of sigmoid colon   . Cancer [facility]) 2012   Right Lung CA  . COPD (chronic obstructive pulmonary disease) ([place])   . Depression   . Diabetes mellitus without complication [facility])    Patient takes Janumet  . Essential hypertension 05/26/2016  . Hypercholesteremia   . Hypertension   . 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 PAD (peripheral artery disease) ([place]) 06/22/2016  . Peripheral vascular disease ([place])   . Shortness of breath dyspnea    with exertion     Family History: Family History  Problem Relation Age of Onset  . Lung cancer Father   . Diabetes Mother   . Diabetes Sister   . Diabetes Maternal Grandmother   . Diabetes Paternal Grandmother   . Diabetes Sister     Social History: Social History   Socioeconomic History  . Marital status: Widowed    Spouse name: Not on file  . Number of children: Not on file  . Years of education: Not on  file  . Highest education level: Not on file  Occupational History  . Not on file  Social Needs  . Financial resource strain: Not on file  . Food insecurity:    Worry: Not on file    Inability: Not on file  . Transportation needs:    Medical: Not on file    Non-medical: Not on file  Tobacco Use  . Smoking status: Former Smoker    Packs/day: 0.90    Years: 37.00    Pack years: 33.30    Types: Cigarettes    Last attempt to quit: 02/06/2010    Years since quitting: 7.9  . Smokeless tobacco: Former Systems developer    Types: Snuff  Substance and Sexual Activity  . Alcohol use: No    Comment: hx of etoh use   . Drug use: Yes    Types: Marijuana    Comment: hx of cocaine use- last use 2015   . Sexual activity: Not on file  Lifestyle  . Physical activity:    Days per week: Not  on file    Minutes per session: Not on file  . Stress: Not on file  Relationships  . Social connections:    Talks on phone: Not on file    Gets together: Not on file    Attends religious service: Not on file    Active member of club or organization: Not on file    Attends meetings of clubs or organizations: Not on file    Relationship status: Not on file  . Intimate partner violence:    Fear of current or ex partner: Not on file    Emotionally abused: Not on file    Physically abused: Not on file    Forced sexual activity: Not on file  Other Topics Concern  . Not on file  Social History Narrative  . Not on file    Vital Signs: Blood pressure 130/80, pulse 72, resp. rate 16, height '4\' 11"'  (1.499 m), weight 109 lb 6.4 oz (49.6 kg), SpO2 95 %.  Examination: General Appearance: The patient is well-developed, well-nourished, and in no distress. Skin: Gross inspection of skin unremarkable. Head: normocephalic, no gross deformities. Eyes: no gross deformities noted. ENT: ears appear grossly normal no exudates. Neck: Supple. No thyromegaly. No LAD. Respiratory: scattered rhonchi. Cardiovascular: Normal S1  and S2 without murmur or rub. Extremities: No cyanosis. pulses are equal. Neurologic: Alert and oriented. No involuntary movements.  LABS: Recent Results (from the past 2160 hour(s))  Comprehensive metabolic panel     Status: Abnormal   Collection Time: 10/12/17 12:15 PM  Result Value Ref Range   Sodium 141 135 - 145 mmol/L   Potassium 3.6 3.5 - 5.1 mmol/L   Chloride 102 101 - 111 mmol/L   CO2 31 22 - 32 mmol/L   Glucose, Bld 85 65 - 99 mg/dL   BUN 25 (H) 6 - 20 mg/dL   Creatinine, Ser 0.99 0.44 - 1.00 mg/dL   Calcium 9.4 8.9 - 10.3 mg/dL   Total Protein 7.9 6.5 - 8.1 g/dL   Albumin 4.5 3.5 - 5.0 g/dL   AST 25 15 - 41 U/L   ALT 22 14 - 54 U/L   Alkaline Phosphatase 69 38 - 126 U/L   Total Bilirubin 0.5 0.3 - 1.2 mg/dL   GFR calc non Af Amer 60 (L) >60 mL/min   GFR calc Af Amer >60 >60 mL/min    Comment: (NOTE) The eGFR has been calculated using the CKD EPI equation. This calculation has not been validated in all clinical situations. eGFR's persistently <60 mL/min signify possible Chronic Kidney Disease.    Anion gap 8 5 - 15    Comment: Performed at [facility], [address] [place], [place] [address]  CEA     Status: Abnormal   Collection Time: 10/12/17 12:15 PM  Result Value Ref Range   CEA 7.9 (H) 0.0 - 4.7 ng/mL    Comment: (NOTE)                             Nonsmokers          <3.9                             Smokers             <5.6 [employer] Electrochemiluminescence Immunoassay (ECLIA) Values obtained with different assay methods or kits cannot be used interchangeably.  Results cannot  be interpreted as absolute evidence of the presence or absence of malignant disease. Performed At: [facility] [place], [place] [id number] Rush Farmer MD JG:[id number] Performed at [facility], [address] [place], [place] [address]   CBC with Differential/Platelet     Status: None   Collection Time: 10/12/17 12:15 PM   Result Value Ref Range   WBC 5.8 3.6 - 11.0 K/uL   RBC 4.28 3.80 - 5.20 MIL/uL   Hemoglobin 12.4 12.0 - 16.0 g/dL   HCT 38.0 35.0 - 47.0 %   MCV 88.9 80.0 - 100.0 fL   MCH 29.0 26.0 - 34.0 pg   MCHC 32.6 32.0 - 36.0 g/dL   RDW 13.9 11.5 - 14.5 %   Platelets 257 150 - 440 K/uL   Neutrophils Relative % 66 %   Neutro Abs 3.8 1.4 - 6.5 K/uL   Lymphocytes Relative 21 %   Lymphs Abs 1.2 1.0 - 3.6 K/uL   Monocytes Relative 10 %   Monocytes Absolute 0.5 0.2 - 0.9 K/uL   Eosinophils Relative 2 %   Eosinophils Absolute 0.1 0 - 0.7 K/uL   Basophils Relative 1 %   Basophils Absolute 0.0 0 - 0.1 K/uL    Comment: Performed at [facility], [address], [place], [place] [address]    Radiology: No results found.  No results found.  No results found.    Assessment and Plan: Patient Active Problem List   Diagnosis Date Noted  . Goals of care, counseling/discussion 10/12/2017  . Personal history of tobacco use, presenting hazards to health 02/03/2017  . PAD (peripheral artery disease) ([place]) 06/22/2016  . Essential hypertension 05/26/2016  . Diabetes ([place]) 05/26/2016  . Hyperlipidemia 05/26/2016  . Atherosclerosis of native arteries of extremity with intermittent claudication ([place]) 05/26/2016  . Uterine leiomyoma 04/30/2016  . Elevated CEA 01/31/2016  . Special screening for malignant neoplasms, colon   . Benign neoplasm of sigmoid colon   . Primary lung cancer ([place])   . Malignant neoplasm of upper lobe of right lung ([place])     1. COPD  She will continue with her current inhaler regimen as prescribed.  Last pulmonary function was done and on this will rescheduled for PFTs after 1 year.  Continue with supportive care.  Currently states that she does not smoke 2. Chronic respiratory failure with hypoxia ongoing oxygen needs this will serve as a face-to-face evaluation for oxygen requirements. 3. Oxygen dependent  Continue on the  present flow rate for her oxygen 4. SOB with  exertion mainly she will continue using her inhalers as prescribed as discussed during our meeting today  General Counseling: I have discussed the findings of the evaluation and examination with [facility].  I have also discussed any further diagnostic evaluation thatmay be needed or ordered today. Fallynn verbalizes understanding of the findings of todays visit. We also reviewed her medications today and discussed drug interactions and side effects including but not limited excessive drowsiness and altered mental states. We also discussed that there is always a risk not just to her but also people around her. she has been encouraged to call the office with any questions or concerns that should arise related to todays visit.    Time spent: 73mn  I have personally obtained a history, examined the patient, evaluated laboratory and imaging results, formulated the assessment and plan and placed orders.    SAllyne Gee MD FRook[facility] County Health CenterPulmonary and Critical Care Sleep medicine

## 2018-01-05 ENCOUNTER — Telehealth: Payer: Self-pay | Admitting: Internal Medicine

## 2018-01-05 NOTE — Telephone Encounter (Signed)
Gave [employer] Overnight Oximetry order for POX. Beth

## 2018-01-14 ENCOUNTER — Telehealth: Payer: Self-pay | Admitting: Internal Medicine

## 2018-01-14 NOTE — Telephone Encounter (Signed)
Received overnight ox results and will have DSK review/Beth

## 2018-01-19 ENCOUNTER — Ambulatory Visit
Admission: RE | Admit: 2018-01-19 | Discharge: 2018-01-19 | Disposition: A | Discharge status: Home / Self Care | Payer: BLUE CROSS/BLUE SHIELD | Source: Ambulatory Visit | Attending: Urgent Care | Admitting: Urgent Care

## 2018-01-19 DIAGNOSIS — I700 Atherosclerosis of aorta: Secondary | ICD-10-CM | POA: Diagnosis not present

## 2018-01-19 DIAGNOSIS — C3411 Malignant neoplasm of upper lobe, right bronchus or lung: Secondary | ICD-10-CM

## 2018-01-19 DIAGNOSIS — I2510 Atherosclerotic heart disease of native coronary artery without angina pectoris: Secondary | ICD-10-CM | POA: Diagnosis not present

## 2018-01-19 DIAGNOSIS — R911 Solitary pulmonary nodule: Secondary | ICD-10-CM | POA: Insufficient documentation

## 2018-01-19 DIAGNOSIS — J439 Emphysema, unspecified: Secondary | ICD-10-CM | POA: Diagnosis not present

## 2018-01-19 DIAGNOSIS — Z85118 Personal history of other malignant neoplasm of bronchus and lung: Secondary | ICD-10-CM | POA: Diagnosis present

## 2018-01-21 ENCOUNTER — Other Ambulatory Visit: Payer: Self-pay

## 2018-01-21 ENCOUNTER — Inpatient Hospital Stay: Payer: BLUE CROSS/BLUE SHIELD | Attending: Hematology and Oncology | Admitting: Hematology and Oncology

## 2018-01-21 ENCOUNTER — Encounter: Payer: Self-pay | Admitting: Hematology and Oncology

## 2018-01-21 VITALS — BP 120/80 | HR 92 | Temp 97.2°F | Resp 18 | Ht 59.0 in | Wt 112.0 lb

## 2018-01-21 DIAGNOSIS — F329 Major depressive disorder, single episode, unspecified: Secondary | ICD-10-CM | POA: Insufficient documentation

## 2018-01-21 DIAGNOSIS — Z801 Family history of malignant neoplasm of trachea, bronchus and lung: Secondary | ICD-10-CM | POA: Insufficient documentation

## 2018-01-21 DIAGNOSIS — Z9981 Dependence on supplemental oxygen: Secondary | ICD-10-CM | POA: Diagnosis not present

## 2018-01-21 DIAGNOSIS — C3411 Malignant neoplasm of upper lobe, right bronchus or lung: Secondary | ICD-10-CM | POA: Diagnosis not present

## 2018-01-21 DIAGNOSIS — M1990 Unspecified osteoarthritis, unspecified site: Secondary | ICD-10-CM | POA: Diagnosis not present

## 2018-01-21 DIAGNOSIS — J449 Chronic obstructive pulmonary disease, unspecified: Secondary | ICD-10-CM | POA: Diagnosis not present

## 2018-01-21 DIAGNOSIS — Z79899 Other long term (current) drug therapy: Secondary | ICD-10-CM | POA: Diagnosis not present

## 2018-01-21 DIAGNOSIS — C3431 Malignant neoplasm of lower lobe, right bronchus or lung: Secondary | ICD-10-CM | POA: Insufficient documentation

## 2018-01-21 DIAGNOSIS — E1151 Type 2 diabetes mellitus with diabetic peripheral angiopathy without gangrene: Secondary | ICD-10-CM | POA: Diagnosis not present

## 2018-01-21 DIAGNOSIS — R911 Solitary pulmonary nodule: Secondary | ICD-10-CM

## 2018-01-21 DIAGNOSIS — Z87891 Personal history of nicotine dependence: Secondary | ICD-10-CM | POA: Diagnosis not present

## 2018-01-21 DIAGNOSIS — Z7189 Other specified counseling: Secondary | ICD-10-CM

## 2018-01-21 DIAGNOSIS — I10 Essential (primary) hypertension: Secondary | ICD-10-CM | POA: Diagnosis not present

## 2018-01-21 DIAGNOSIS — Z7982 Long term (current) use of aspirin: Secondary | ICD-10-CM | POA: Insufficient documentation

## 2018-01-21 DIAGNOSIS — E7800 Pure hypercholesterolemia, unspecified: Secondary | ICD-10-CM | POA: Insufficient documentation

## 2018-01-21 NOTE — Progress Notes (Signed)
[facility] day:  01/21/2018   Chief Complaint: Angela Jensen is a 62 y.o. female with a history of stage I lung cancer who is seen for review of interval chest CT and 3 month assessment.   HPI: The patient was last seen in the medical oncology clinic on 10/12/2017.  At that time, she felt "fine".  She had stable exertional dyspnea.  Exam was stable. CEA was 7.9.  Chest CT on 01/19/2018 revealed a subsolid 2.1 cm peripheral right lower lobe pulmonary nodule.  Although not appreciably changed in size, subjectively this nodule had increased in density, and remains suspicious for primary bronchogenic adenocarcinoma.  There was no thoracic adenopathy or other findings of metastatic disease in the chest.  During the interim, patient has been doing "fine". She notes that she has continued shortness of breath. She has a chronic cough. Previously reported LEFT shoulder pain has resolved. Patient denies that she has experienced any B symptoms. She denies any interval infections.   Patient maintains an adequate appetite, and notes that she is eating well. Weight, compared to her last visit to the clinic, has decreased by 4 pounds.   Patient denies pain in the clinic today.   Past Medical History:  Diagnosis Date  . Arthritis   . Asthma   . Atherosclerosis of native arteries of extremity with intermittent claudication ([place]) 05/26/2016  . Benign neoplasm of sigmoid colon   . Cancer [facility]) 2012   Right Lung CA  . COPD (chronic obstructive pulmonary disease) ([place])   . Depression   . Diabetes mellitus without complication [facility])    Patient takes Janumet  . Essential hypertension 05/26/2016  . Hypercholesteremia   . Hypertension   . 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 PAD (peripheral artery disease) ([place]) 06/22/2016  . Peripheral vascular disease ([place])   . Shortness of breath dyspnea    with exertion     Past Surgical History:  Procedure Laterality Date   . CESAREAN SECTION     x3  . COLONOSCOPY WITH PROPOFOL N/A 06/25/2015   Procedure: COLONOSCOPY WITH PROPOFOL;  Surgeon: Lucilla Lame, MD;  Location: [facility] ENDOSCOPY;  Service: Endoscopy;  Laterality: N/A;  . CYST REMOVAL LEG     and on shoulder   . LUNG BIOPSY  05 15 2013   has lung "spots"  . PERIPHERAL VASCULAR CATHETERIZATION Left 06/01/2016   Procedure: Lower Extremity Angiography;  Surgeon: Algernon Huxley, MD;  Location: [place] CV LAB;  Service: Cardiovascular;  Laterality: Left;  . PERIPHERAL VASCULAR CATHETERIZATION N/A 06/01/2016   Procedure: Abdominal Aortogram w/Lower Extremity;  Surgeon: Algernon Huxley, MD;  Location: [place] [facility];  Service: Cardiovascular;  Laterality: N/A;  . PERIPHERAL VASCULAR CATHETERIZATION  06/01/2016   Procedure: Lower Extremity Intervention;  Surgeon: Algernon Huxley, MD;  Location: [place] [facility];  Service: Cardiovascular;;  . PERIPHERAL VASCULAR CATHETERIZATION Right 06/08/2016   Procedure: Lower Extremity Angiography;  Surgeon: Algernon Huxley, MD;  Location: [place] [facility];  Service: Cardiovascular;  Laterality: Right;  . PERIPHERAL VASCULAR CATHETERIZATION  06/08/2016   Procedure: Lower Extremity Intervention;  Surgeon: Algernon Huxley, MD;  Location: [place] CV LAB;  Service: Cardiovascular;;    Family History  Problem Relation Age of Onset  . Lung cancer Father   . Diabetes Mother   . Diabetes Sister   . Diabetes Maternal Grandmother   . Diabetes Paternal Grandmother   .  Diabetes Sister     Social History:  reports that she quit smoking about 7 years ago. Her smoking use included cigarettes. She has a 33.30 pack-year smoking history. She has quit using smokeless tobacco. Her smokeless tobacco use included snuff. She reports that she has current or past drug history. Drug: Marijuana. She reports that she does not drink alcohol.  She has not smoked for 1 month.  She started smoking at 17 until she was "61 something".  She lives in  [place].  The patient is alone today.  Allergies: No Known Allergies  Current Medications: Current Outpatient Medications  Medication Sig Dispense Refill  . albuterol (PROAIR HFA) 108 (90 BASE) MCG/ACT inhaler Inhale 2 puffs into the lungs every 4 (four) hours as needed for wheezing or shortness of breath.     Marland Kitchen albuterol (PROVENTIL) (2.5 MG/3ML) 0.083% nebulizer solution Take 2.5 mg by nebulization every 6 (six) hours as needed for wheezing.     Marland Kitchen amLODipine (NORVASC) 10 MG tablet Take 1 tablet (10 mg total) by mouth every morning. 30 tablet 5  . aspirin EC 81 MG tablet Take 1 tablet (81 mg total) by mouth daily. 150 tablet 2  . buPROPion (WELLBUTRIN XL) 150 MG 24 hr tablet TAKE 1 TABLET BY MOUTH TWICE DAILY FOR SMOKING CESSATION 60 tablet 6  . clopidogrel (PLAVIX) 75 MG tablet TAKE ONE TABLET BY MOUTH ONCE DAILY 30 tablet 11  . gabapentin (NEURONTIN) 100 MG capsule Take 1 capsule (100 mg total) by mouth 2 (two) times daily. 60 capsule 5  . glucose blood (ACCU-CHEK AVIVA PLUS) test strip Use as instructed to check blood sugars three times daily.  E11.65 250 each 5  . lisinopril-hydrochlorothiazide (PRINZIDE,ZESTORETIC) 10-12.5 MG per tablet Take 1 tablet by mouth every morning.    . meloxicam (MOBIC) 7.5 MG tablet Take 1 tablet (7.5 mg total) by mouth 2 (two) times daily. 60 tablet 3  . mometasone-formoterol (DULERA) 200-5 MCG/ACT AERO Inhale 1 puff into the lungs 2 (two) times daily. 1 Inhaler 5  . OXYGEN Inhale 2 L into the lungs at bedtime.    . potassium chloride (K-DUR) 10 MEQ tablet TAKE 1 TABLET BY MOUTH EVERY OTHER DAY FOR  LOW  POTASSIUM 15 tablet 2  . rOPINIRole (REQUIP) 0.5 MG tablet Take 1 tablet (0.5 mg total) by mouth 2 (two) times daily. 60 tablet 2  . rosuvastatin (CRESTOR) 10 MG tablet Take 1 tablet (10 mg total) by mouth at bedtime. 30 tablet 5  . SitaGLIPtin-MetFORMIN HCl (JANUMET XR) 50-500 MG TB24 Take 1 tablet by mouth daily with breakfast.     . tiotropium (SPIRIVA  HANDIHALER) 18 MCG inhalation capsule Place 1 capsule (18 mcg total) into inhaler and inhale daily. 30 capsule 5   No current facility-administered medications for this visit.     Review of Systems  Constitutional: Negative for diaphoresis, fever, malaise/fatigue and weight loss.       "I feel fine"  HENT: Negative.   Eyes: Negative.   Respiratory: Positive for cough and shortness of breath (exertional). Negative for hemoptysis and sputum production.   Cardiovascular: Negative for chest pain, palpitations, orthopnea, leg swelling and PND.  Gastrointestinal: Negative for abdominal pain, blood in stool, constipation, diarrhea, melena, nausea and vomiting.  Genitourinary: Negative for dysuria, frequency, hematuria and urgency.  Musculoskeletal: Positive for joint pain (LEFT shoulder; improved). Negative for back pain, falls and myalgias.  Skin: Negative for itching and rash.  Neurological: Negative for dizziness, tremors, weakness and headaches.  Endo/Heme/Allergies: Does not bruise/bleed easily.       Diabetes  Psychiatric/Behavioral: Negative for depression, memory loss and suicidal ideas. The patient is not nervous/anxious and does not have insomnia.   All other systems reviewed and are negative.  Performance status (ECOG): 1 - Symptomatic but completely ambulatory  Physical Exam: Blood pressure 120/80, pulse 92, temperature (!) 97.2 F (36.2 C), temperature source Tympanic, resp. rate 18, height '4\' 11"'  (1.499 m), weight 112 lb (50.8 kg). GENERAL:  Thin woman sitting comfortably in the exam room in no acute distress. MENTAL STATUS:  Alert and oriented to person, place and time. HEAD:  Pearline Cables hair.  Normocephalic, atraumatic, face symmetric, no Cushingoid features. EYES:  Glasses.  Brown eyes.  Pupils equal round and reactive to light and accomodation.  No conjunctivitis or scleral icterus. ENT:  Oropharynx clear without lesion.  Tongue normal. Mucous membranes moist.  RESPIRATORY:   Clear to auscultation without rales, wheezes or rhonchi. CARDIOVASCULAR:  Regular rate and rhythm without murmur, rub or gallop. ABDOMEN:  Soft, non-tender, with active bowel sounds, and no hepatosplenomegaly.  No masses. SKIN:  No rashes, ulcers or lesions. EXTREMITIES: No edema, no skin discoloration or tenderness.  No palpable cords. LYMPH NODES: No palpable cervical, supraclavicular, axillary or inguinal adenopathy  NEUROLOGICAL: Unremarkable. PSYCH:  Appropriate.    No visits with results within 3 Day(s) from this visit.  Latest known visit with results is:  Appointment on 10/12/2017  Component Date Value Ref Range Status  . Sodium 10/12/2017 141  135 - 145 mmol/L Final  . Potassium 10/12/2017 3.6  3.5 - 5.1 mmol/L Final  . Chloride 10/12/2017 102  101 - 111 mmol/L Final  . CO2 10/12/2017 31  22 - 32 mmol/L Final  . Glucose, Bld 10/12/2017 85  65 - 99 mg/dL Final  . BUN 10/12/2017 25* 6 - 20 mg/dL Final  . Creatinine, Ser 10/12/2017 0.99  0.44 - 1.00 mg/dL Final  . Calcium 10/12/2017 9.4  8.9 - 10.3 mg/dL Final  . Total Protein 10/12/2017 7.9  6.5 - 8.1 g/dL Final  . Albumin 10/12/2017 4.5  3.5 - 5.0 g/dL Final  . AST 10/12/2017 25  15 - 41 U/L Final  . ALT 10/12/2017 22  14 - 54 U/L Final  . Alkaline Phosphatase 10/12/2017 69  38 - 126 U/L Final  . Total Bilirubin 10/12/2017 0.5  0.3 - 1.2 mg/dL Final  . GFR calc non Af Amer 10/12/2017 60* >60 mL/min Final  . GFR calc Af Amer 10/12/2017 >60  >60 mL/min Final   Comment: (NOTE) The eGFR has been calculated using the CKD EPI equation. This calculation has not been validated in all clinical situations. eGFR's persistently <60 mL/min signify possible Chronic Kidney Disease.   Georgiann Hahn gap 10/12/2017 8  5 - 15 Final   Performed at [facility], [address] [place],  [address]  . CEA 10/12/2017 7.9* 0.0 - 4.7 ng/mL Final   Comment: (NOTE)                             Nonsmokers          <3.9                              Smokers             <5.6 [employer] Electrochemiluminescence Immunoassay (ECLIA) Values obtained with  different assay methods or kits cannot be used interchangeably.  Results cannot be interpreted as absolute evidence of the presence or absence of malignant disease. Performed At: [facility] [place], [place] [id number] Rush Farmer MD IY:[id number] Performed at [facility], [address], [place], [place] [address]   . WBC 10/12/2017 5.8  3.6 - 11.0 K/uL Final  . RBC 10/12/2017 4.28  3.80 - 5.20 MIL/uL Final  . Hemoglobin 10/12/2017 12.4  12.0 - 16.0 g/dL Final  . HCT 10/12/2017 38.0  35.0 - 47.0 % Final  . MCV 10/12/2017 88.9  80.0 - 100.0 fL Final  . MCH 10/12/2017 29.0  26.0 - 34.0 pg Final  . MCHC 10/12/2017 32.6  32.0 - 36.0 g/dL Final  . RDW 10/12/2017 13.9  11.5 - 14.5 % Final  . Platelets 10/12/2017 257  150 - 440 K/uL Final  . Neutrophils Relative % 10/12/2017 66  % Final  . Neutro Abs 10/12/2017 3.8  1.4 - 6.5 K/uL Final  . Lymphocytes Relative 10/12/2017 21  % Final  . Lymphs Abs 10/12/2017 1.2  1.0 - 3.6 K/uL Final  . Monocytes Relative 10/12/2017 10  % Final  . Monocytes Absolute 10/12/2017 0.5  0.2 - 0.9 K/uL Final  . Eosinophils Relative 10/12/2017 2  % Final  . Eosinophils Absolute 10/12/2017 0.1  0 - 0.7 K/uL Final  . Basophils Relative 10/12/2017 1  % Final  . Basophils Absolute 10/12/2017 0.0  0 - 0.1 K/uL Final   Performed at [facility], [address] [place], [place] [address]    Assessment:  RUDINE RIEGER is a 62 y.o. 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   She was not a good surgery candidate secondary to her lung function (FEV1 28%).  Chest CT on 07/31/2011 revealed a 1.1 x 0.6 cm RUL nodule and a 9 mm inferior aspect RUL nodule and a 4 mm nodule in the anterior aspect of the inferior aspect of the RUL.  PET scan on 08/03/2011 revealed a 10-mm  spiculated right apical pulmonary nodule (SUV 2.5) suspcious for lung cancer. Left ovary was prominent (3.9 x 2.4 cm).   CA-125 was 9.3 (normal).  CT guided biopsy of the superior and inferior RUL nodules on 12/30/2011 revealed well differentiated adenocarcinoma of lung primary.  EGFR testing revealed no mutation.  She received IMRT 6000 cGy to both nodules from 01/26/2012 - 03/10/2012.  PET scan on 01/30/2015 revealed a hypermetabolic and enlarging RUL pulmonary nodules concerning for lung cancer recurrence.  There was a small focus of peripheral nodular consolidation in the superior segment of the right lower lobe with differential including inflammation/infection versus neoplasm.  There was no evidence of mediastinal nodal metastasis.    PET scan on 03/27/2016 revealed no hypermetabolic activity to suggest recurrent malignancy. There was a soft tissue density in the left pelvis near the inguinal ring that was not hypermetabolic, but prominent and probably separate from the left ovary. This could be a wandering fibroid.  Although the overall appearance had not changed from 2012 and was not hypermetabolic, pelvic sonography or pelvic MRI for further characterization of the uterus and ovaries can be considered.  In 04/2015, there was an attempt at radiofrequency ablation of 1 enlarging RUL nodule.  At the time of the procedure, the nodule had shrunk to 5 mm from 9 mm.    Chest CT on 08/01/2015 revealed complete resolution of  the prior nodule in the right lung apex.   Chest CT on 01/31/2016 revealed no active cardiopulmonary abnormalities.  There was stable appearance of right apical and right middle lobe scar like densities.   Low dose chest CT on 02/02/2017 revealed new ground-glass nodule in the right lower lobe. Lung-RADS 3 (probably benign findings).   Low dose chest CT  on 07/29/2017 demonstrated enlargement of the previously observed sub-solid pulmonary lesion. RIGHT upper lobe lesion measured 1.4  x 2.1 x 1.6 cm (previously 1.5 x 1.1 x 1.7 cm). There was also areas of focal architectural distortion in the RIGHT upper and middle lobes that were felt to be most compatible with areas of chronic post infectious or inflammatory scarring. Aortic value calcifications were noted. Lung-RADS 4BS (suspicious).  Chest CT on 01/19/2018 revealed a subsolid 2.1 cm peripheral right lower lobe pulmonary nodule.  Although not appreciably changed in size, subjectively this nodule had increased in density, and remains suspicious for primary bronchogenic adenocarcinoma.  There was no thoracic adenopathy.  She has an elevated CEA of unclear etiology.  CEA was 8.1 on 01/31/2016, 9.2 on 03/03/2016, and 7.9 on 10/12/2017.  Colonoscopy on 06/25/2015 revealed a 4 mm polyp in the sigmoid colon.  Pathology revealed a tubular adenoma without evidence of dysplasia or malignancy.  Pelvic MRI on 04/30/2016 revealed a 3.4 x 2.5 cm ovoid mass in the LEFT adnexa with signal characteristics identical to the intramural leiomyomas and consistent with an exophytic leiomyoma.  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  Endometrium was normal with a normal junctional zone.  Ovaries were not identified.  Symptomatically, she feels "fine".  She has stable exertional dyspnea.  Exam is stable.   Plan: 1. Discuss interval chest CT- RLL nodule suspicious for lung cancer. 2. Present at tumor board on 01/27/2018. 3. Discuss surgical intervention. Patient notes that Dr. Genevive Bi has advised her that she is not a candidate for surgery due to poor pulmonary function status.  4. Speak with Dr. Baruch Gouty regarding potential for stereotactic radiosurgery.  5. Will call patient [phone] - [name]) after tumor board for an appointment.     Honor Loh, NP  01/21/2018, 11:28 AM   I saw and evaluated the patient, participating in the key portions of the service and reviewing pertinent diagnostic studies and records.  I reviewed the nurse  practitioner's note and agree with the findings and the plan.  The assessment and plan were discussed with the patient.  Several questions were asked by the patient and answered.   Nolon Stalls, MD 01/21/2018, 11:28 AM

## 2018-01-27 ENCOUNTER — Other Ambulatory Visit: Payer: BLUE CROSS/BLUE SHIELD

## 2018-01-27 ENCOUNTER — Other Ambulatory Visit: Payer: Self-pay | Admitting: Urgent Care

## 2018-01-27 DIAGNOSIS — R911 Solitary pulmonary nodule: Secondary | ICD-10-CM

## 2018-01-27 NOTE — Progress Notes (Signed)
Patient discussed at multidisciplinary tumor board again today. RLL mass has been found to have increased in density on recent CT imaging. IR now feels that they are able to move forward with a biopsy that was previously discussed, but ultimately decided against. I have entered the orders for the biopsy, and sent the documentation to centralized scheduling. I have called the patient to make her aware of the recommendations and plans going forward, and she is in agreement with the proposed plan.   Of note, patient is currently on clopidogrel. I have messaged Dr. [name] requesting clearance to hold patient's anticoagulation for 5 days prior to the planned procedure. Once the procedure has been officially scheduled, I will call the patient back to advise her on when she will need to begin her washout period. Additionally, patient will be scheduled for labs the day before the procedure (CBC, PT, PTT) per standard procedural practice.   Honor Loh, MSN, APRN, FNP-C, CEN Oncology/Hematology Nurse Practitioner  [facility] 01/27/18 7:12 PM

## 2018-01-31 ENCOUNTER — Telehealth: Payer: Self-pay | Admitting: *Deleted

## 2018-01-31 NOTE — Telephone Encounter (Signed)
Called patient to inform her about her scheduled biopsy.  Gave detailed instructions regarding blood thinners (stopping and restarting, arrival date and time for procedure, etc.  Instructed her to call back if questions.

## 2018-01-31 NOTE — Telephone Encounter (Signed)
-----   Message from Karen Kitchens, NP sent at 01/28/2018  7:02 PM EDT ----- Regarding: Call patient... Please call patient and let her know that we have her scheduled for her CT guided lung biopsy.   Information she will need: 1. HOLD Plavix starting on 02/04/2018 (last dose should be on 06/20). 2. NPO after midnight on 02/08/2018 3. RTC on 02/08/2018 for labs (CBC, PT, PTT - already ordered) 4. Procedure is scheduled for 02/09/2018 at 1000, she will need to arrive to the medical mall by 0900. 5.  May restart Plavix on 02/10/2018 if the procedure goes well and there are no complications.   Thanks, Gaspar Bidding

## 2018-02-01 ENCOUNTER — Telehealth: Payer: Self-pay | Admitting: *Deleted

## 2018-02-01 NOTE — Telephone Encounter (Signed)
-----   Message from Karen Kitchens, NP sent at 01/28/2018  7:02 PM EDT ----- Regarding: Call patient... Please call patient and let her know that we have her scheduled for her CT guided lung biopsy.   Information she will need: 1. HOLD Plavix starting on 02/04/2018 (last dose should be on 06/20). 2. NPO after midnight on 02/08/2018 3. RTC on 02/08/2018 for labs (CBC, PT, PTT - already ordered) 4. Procedure is scheduled for 02/09/2018 at 1000, she will need to arrive to the medical mall by 0900. 5.  May restart Plavix on 02/10/2018 if the procedure goes well and there are no complications.   Thanks, Gaspar Bidding

## 2018-02-01 NOTE — Telephone Encounter (Signed)
Called patient back this morning to determine if she got my message yesterday regarding her CT guided lung biopsy.  She states she did not, so I went over instructions with her.  She repeated back to me and verbalized understanding of instructions.

## 2018-02-02 NOTE — Progress Notes (Signed)
Scanned in overnight pulse oximetry started on 01/11/18 at 11:22 pm.

## 2018-02-07 ENCOUNTER — Telehealth: Payer: Self-pay | Admitting: *Deleted

## 2018-02-07 NOTE — Telephone Encounter (Signed)
Yes. Stop ASA. Resume after biopsy.

## 2018-02-07 NOTE — Telephone Encounter (Signed)
Patient called asking if she is to stop her ASA pre biopsy, She states she was tols to stop her Plavix, but nothing was said about her ASA

## 2018-02-07 NOTE — Telephone Encounter (Signed)
Call returned to patient and she was instructed to stop ASA and resume after the biopsy, restart it, She repeated this back to me

## 2018-02-08 ENCOUNTER — Other Ambulatory Visit: Payer: Self-pay

## 2018-02-08 ENCOUNTER — Inpatient Hospital Stay: Payer: BLUE CROSS/BLUE SHIELD

## 2018-02-08 ENCOUNTER — Other Ambulatory Visit: Payer: Self-pay | Admitting: Radiology

## 2018-02-08 DIAGNOSIS — D125 Benign neoplasm of sigmoid colon: Secondary | ICD-10-CM

## 2018-02-08 DIAGNOSIS — C3411 Malignant neoplasm of upper lobe, right bronchus or lung: Secondary | ICD-10-CM | POA: Diagnosis not present

## 2018-02-08 DIAGNOSIS — R911 Solitary pulmonary nodule: Secondary | ICD-10-CM

## 2018-02-08 LAB — CBC WITH DIFFERENTIAL/PLATELET
Basophils Absolute: 0 10*3/uL (ref 0–0.1)
Basophils Relative: 1 %
Eosinophils Absolute: 0.1 10*3/uL (ref 0–0.7)
Eosinophils Relative: 2 %
HCT: 39.4 % (ref 35.0–47.0)
Hemoglobin: 13.2 g/dL (ref 12.0–16.0)
Lymphocytes Relative: 24 %
Lymphs Abs: 0.9 10*3/uL — ABNORMAL LOW (ref 1.0–3.6)
MCH: 29.6 pg (ref 26.0–34.0)
MCHC: 33.4 g/dL (ref 32.0–36.0)
MCV: 88.7 fL (ref 80.0–100.0)
Monocytes Absolute: 0.3 10*3/uL (ref 0.2–0.9)
Monocytes Relative: 8 %
Neutro Abs: 2.4 10*3/uL (ref 1.4–6.5)
Neutrophils Relative %: 65 %
Platelets: 267 10*3/uL (ref 150–440)
RBC: 4.44 MIL/uL (ref 3.80–5.20)
RDW: 13.9 % (ref 11.5–14.5)
WBC: 3.6 10*3/uL (ref 3.6–11.0)

## 2018-02-08 LAB — PROTIME-INR
INR: 1
Prothrombin Time: 13.1 seconds (ref 11.4–15.2)

## 2018-02-08 LAB — APTT: aPTT: 29 seconds (ref 24–36)

## 2018-02-09 ENCOUNTER — Ambulatory Visit
Admission: RE | Admit: 2018-02-09 | Discharge: 2018-02-09 | Disposition: A | Discharge status: Home / Self Care | Payer: BLUE CROSS/BLUE SHIELD | Source: Ambulatory Visit | Attending: Interventional Radiology | Admitting: Interventional Radiology

## 2018-02-09 ENCOUNTER — Ambulatory Visit
Admission: RE | Admit: 2018-02-09 | Discharge: 2018-02-09 | Disposition: A | Discharge status: Home / Self Care | Payer: BLUE CROSS/BLUE SHIELD | Source: Ambulatory Visit | Attending: Urgent Care | Admitting: Urgent Care

## 2018-02-09 DIAGNOSIS — C3431 Malignant neoplasm of lower lobe, right bronchus or lung: Secondary | ICD-10-CM | POA: Diagnosis not present

## 2018-02-09 DIAGNOSIS — R911 Solitary pulmonary nodule: Secondary | ICD-10-CM

## 2018-02-09 DIAGNOSIS — Z9889 Other specified postprocedural states: Secondary | ICD-10-CM | POA: Insufficient documentation

## 2018-02-09 LAB — GLUCOSE, CAPILLARY: Glucose-Capillary: 100 mg/dL — ABNORMAL HIGH (ref 70–99)

## 2018-02-09 MED ORDER — LIDOCAINE-EPINEPHRINE 0.5 %-1:200000 IJ SOLN: INTRAMUSCULAR | Status: AC | PRN

## 2018-02-09 MED ORDER — HYDROCODONE-ACETAMINOPHEN 5-325 MG PO TABS: 1.0000 | ORAL_TABLET | ORAL | Status: DC | PRN

## 2018-02-09 MED ORDER — FENTANYL CITRATE (PF) 100 MCG/2ML IJ SOLN
INTRAMUSCULAR | Status: AC
  Filled 2018-02-09: qty 4

## 2018-02-09 MED ORDER — SODIUM CHLORIDE 0.9 % IV SOLN
INTRAVENOUS | Status: DC
  Administered 2018-02-09: 10:00:00 via INTRAVENOUS

## 2018-02-09 MED ORDER — MIDAZOLAM HCL 5 MG/5ML IJ SOLN
INTRAMUSCULAR | Status: AC
  Filled 2018-02-09: qty 5

## 2018-02-09 MED ORDER — LIDOCAINE HCL (PF) 1 % IJ SOLN
INTRAMUSCULAR | Status: AC | PRN
  Administered 2018-02-09: 10 mL

## 2018-02-09 MED ORDER — FENTANYL CITRATE (PF) 100 MCG/2ML IJ SOLN
INTRAMUSCULAR | Status: AC | PRN
  Administered 2018-02-09: 50 ug via INTRAVENOUS
  Administered 2018-02-09: 25 ug via INTRAVENOUS

## 2018-02-09 MED ORDER — MIDAZOLAM HCL 5 MG/5ML IJ SOLN
INTRAMUSCULAR | Status: AC | PRN
  Administered 2018-02-09: 1 mg via INTRAVENOUS
  Administered 2018-02-09: 0.5 mg via INTRAVENOUS
  Administered 2018-02-09: 1 mg via INTRAVENOUS

## 2018-02-09 NOTE — Procedures (Signed)
  Procedure: CT RLL lung lesion core biopsy 18g x3 EBL:   minimal Complications:  none immediate  See full dictation in [employer].  Dillard Cannon MD Main # [phone] Pager  [phone]

## 2018-02-09 NOTE — Discharge Instructions (Signed)
Needle Biopsy of the Lung, Care After °This sheet gives you information about how to care for yourself after your procedure. Your health care provider may also give you more specific instructions. If you have problems or questions, contact your health care provider. °What can I expect after the procedure? °After the procedure, it is common to have: °· Soreness, pain, and tenderness where a tissue sample was taken (biopsy site). °· A cough. °· A sore throat. ° °Follow these instructions at home: °Biopsy site care °· Follow instructions from your health care provider about when to remove the bandage that was placed on the biopsy site. °· Keep the bandage dry until it has been removed. °· Check your biopsy site every day for signs of infection. Check for: °? More redness, swelling, or pain. °? More fluid or blood. °? Warmth to the touch. °? Pus or a bad smell. °General instructions °· Rest as directed by your health care provider. Ask your health care provider what activities are safe for you. °· Do not take baths, swim, or use a hot tub until your health care provider approves. °· Take over-the-counter and prescription medicines only as told by your health care provider. °· If you have airplane travel scheduled, talk with your health care provider about when it is safe for you to travel by airplane. °· It is up to you to get the results of your procedure. Ask your health care provider, or the department that is doing the procedure, when your results will be ready. °· Keep all follow-up visits as told by your health care provider. This is important. °Contact a health care provider if: °· You have more redness, swelling, or pain around your biopsy site. °· You have more fluid or blood coming from your biopsy site. °· Your biopsy site feels warm to the touch. °· You have pus or a bad smell coming from your biopsy site. °· You have a fever. °· You have pain that does not get better with medicine. °Get help right away  if: °· You have problems breathing. °· You have chest pain. °· You cough up blood. °· You faint. °· You have a fast heart rate. °Summary °· After a needle biopsy of the lung, it is common to have a cough, a sore throat, or soreness, pain, and tenderness where a tissue sample was taken (biopsy site). °· You should check your biopsy area every day for signs of infection, including pus or a bad smell, warmth, more fluid or blood, or more redness, swelling, or pain. °· You should not take baths, swim, or use a hot tub until your health care provider approves. °· It is up to you to get the results of your procedure. Ask your health care provider, or the department that is doing the procedure, when your results will be ready. °This information is not intended to replace advice given to you by your health care provider. Make sure you discuss any questions you have with your health care provider. °Document Released: 05/31/2007 Document Revised: 06/24/2016 Document Reviewed: 06/24/2016 °Elsevier Interactive Patient Education © 2017 [employer] ° ° °

## 2018-02-10 ENCOUNTER — Telehealth: Payer: Self-pay | Admitting: *Deleted

## 2018-02-10 NOTE — Telephone Encounter (Signed)
Patient called reporting that she is coughing up blood post her biopsy yesterday. On questioning patient, she reports that it is streaked in "phlegm" and it is dark in color. I advised her that this is normal post biopsy, but if she begins coughing up bright red blood more than a teaspoon to go to the ER. She verbalized understanding. Patient does not  Have follow up appointment with [name]

## 2018-02-11 ENCOUNTER — Emergency Department
Admission: EM | Admit: 2018-02-11 | Discharge: 2018-02-11 | Disposition: A | Discharge status: Home / Self Care | Payer: BLUE CROSS/BLUE SHIELD | Attending: Emergency Medicine | Admitting: Emergency Medicine

## 2018-02-11 ENCOUNTER — Other Ambulatory Visit: Payer: Self-pay

## 2018-02-11 ENCOUNTER — Emergency Department: Payer: BLUE CROSS/BLUE SHIELD

## 2018-02-11 DIAGNOSIS — Z85118 Personal history of other malignant neoplasm of bronchus and lung: Secondary | ICD-10-CM | POA: Insufficient documentation

## 2018-02-11 DIAGNOSIS — Z7982 Long term (current) use of aspirin: Secondary | ICD-10-CM | POA: Diagnosis not present

## 2018-02-11 DIAGNOSIS — I10 Essential (primary) hypertension: Secondary | ICD-10-CM | POA: Diagnosis not present

## 2018-02-11 DIAGNOSIS — Z7902 Long term (current) use of antithrombotics/antiplatelets: Secondary | ICD-10-CM | POA: Insufficient documentation

## 2018-02-11 DIAGNOSIS — Z79899 Other long term (current) drug therapy: Secondary | ICD-10-CM | POA: Insufficient documentation

## 2018-02-11 DIAGNOSIS — R042 Hemoptysis: Secondary | ICD-10-CM | POA: Diagnosis not present

## 2018-02-11 DIAGNOSIS — Z7984 Long term (current) use of oral hypoglycemic drugs: Secondary | ICD-10-CM | POA: Diagnosis not present

## 2018-02-11 DIAGNOSIS — Z87891 Personal history of nicotine dependence: Secondary | ICD-10-CM | POA: Insufficient documentation

## 2018-02-11 DIAGNOSIS — J449 Chronic obstructive pulmonary disease, unspecified: Secondary | ICD-10-CM | POA: Diagnosis not present

## 2018-02-11 DIAGNOSIS — E119 Type 2 diabetes mellitus without complications: Secondary | ICD-10-CM | POA: Insufficient documentation

## 2018-02-11 LAB — BASIC METABOLIC PANEL
Anion gap: 8 (ref 5–15)
BUN: 14 mg/dL (ref 8–23)
CALCIUM: 9.4 mg/dL (ref 8.9–10.3)
CO2: 27 mmol/L (ref 22–32)
Chloride: 103 mmol/L (ref 98–111)
Creatinine, Ser: 0.65 mg/dL (ref 0.44–1.00)
GFR calc Af Amer: >60 mL/min (ref >60)
GLUCOSE: 100 mg/dL — AB (ref 70–99)
Potassium: 4.2 mmol/L (ref 3.5–5.1)
Sodium: 138 mmol/L (ref 135–145)

## 2018-02-11 LAB — CBC
HEMATOCRIT: 36.5 % (ref 35.0–47.0)
Hemoglobin: 12.1 g/dL (ref 12.0–16.0)
MCH: 29.6 pg (ref 26.0–34.0)
MCHC: 33.2 g/dL (ref 32.0–36.0)
MCV: 89.1 fL (ref 80.0–100.0)
PLATELETS: 244 10*3/uL (ref 150–440)
RBC: 4.09 MIL/uL (ref 3.80–5.20)
RDW: 14.1 % (ref 11.5–14.5)
WBC: 3.9 10*3/uL (ref 3.6–11.0)

## 2018-02-11 LAB — SURGICAL PATHOLOGY

## 2018-02-11 NOTE — ED Triage Notes (Signed)
Pt states she had a biopsy of the right lung on Wednesday and had was coughing up small amount of blood yesterday and was told that it is normal., today states she is coughing up more blood with small clots. Denies any pain or SOB.Angela Jensen

## 2018-02-11 NOTE — ED Provider Notes (Signed)
[facility] Emergency Department Provider Note   ____________________________________________   First MD Initiated Contact with Patient 02/11/18 1224     (approximate)  I have reviewed the triage vital signs and the nursing notes.   HISTORY  Chief Complaint Hemoptysis    HPI ARIYANNA OIEN is a 62 y.o. female currently being followed by Dr. Mike Gip, history of COPD, had a right lung biopsy performed 2 days ago for evaluation of the possible lung mass..  Patient reports that she had a biopsy of the right lung 2 days ago, she has had some occasional spitting up of blood since that time with coughing.  Is not painful, she can even see the new Mellony Danziger on the outside and reports is healing well.  Over the last 2 days she has had occasional times where she coughs and with the phlegm she will get mixed blood.  Small amounts slightly dark clot review the size of a dime or nickel.  No major hemorrhage.  She does take aspirin and Plavix, but has not been on either for about 7 days.  She has not resumed either those medications.  No shortness of breath.  No nausea vomiting.  No fevers or chills.  She has not noticed any yellow or thick sputum except for spots of blood in it.  No abdominal pain.  No chest pain  Past Medical History:  Diagnosis Date  . Arthritis   . Asthma   . Atherosclerosis of native arteries of extremity with intermittent claudication ([place]) 05/26/2016  . Benign neoplasm of sigmoid colon   . Cancer [facility]) 2012   Right Lung CA  . COPD (chronic obstructive pulmonary disease) ([place])   . Depression   . Diabetes mellitus without complication [facility])    Patient takes Janumet  . Essential hypertension 05/26/2016  . Hypercholesteremia   . Hypertension   . 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 PAD (peripheral artery disease) ([place]) 06/22/2016  . Peripheral vascular disease ([place])   . Shortness of breath dyspnea    with exertion     Patient Active Problem  List   Diagnosis Date Noted  . Nodule of lower lobe of right lung 01/21/2018  . Goals of care, counseling/discussion 10/12/2017  . Personal history of tobacco use, presenting hazards to health 02/03/2017  . PAD (peripheral artery disease) ([place]) 06/22/2016  . Essential hypertension 05/26/2016  . Diabetes ([place]) 05/26/2016  . Hyperlipidemia 05/26/2016  . Atherosclerosis of native arteries of extremity with intermittent claudication ([place]) 05/26/2016  . Uterine leiomyoma 04/30/2016  . Elevated CEA 01/31/2016  . Special screening for malignant neoplasms, colon   . Benign neoplasm of sigmoid colon   . Primary lung cancer ([place])   . Malignant neoplasm of upper lobe of right lung [facility])     Past Surgical History:  Procedure Laterality Date  . CESAREAN SECTION     x3  . COLONOSCOPY WITH PROPOFOL N/A 06/25/2015   Procedure: COLONOSCOPY WITH PROPOFOL;  Surgeon: Lucilla Lame, MD;  Location: [facility] ENDOSCOPY;  Service: Endoscopy;  Laterality: N/A;  . CYST REMOVAL LEG     and on shoulder   . LUNG BIOPSY  05 15 2013   has lung "spots"  . PERIPHERAL VASCULAR CATHETERIZATION Left 06/01/2016   Procedure: Lower Extremity Angiography;  Surgeon: Algernon Huxley, MD;  Location: [place] CV LAB;  Service: Cardiovascular;  Laterality: Left;  . PERIPHERAL VASCULAR CATHETERIZATION N/A 06/01/2016   Procedure: Abdominal Aortogram w/Lower Extremity;  Surgeon: Algernon Huxley, MD;  Location: [place] CV LAB;  Service: Cardiovascular;  Laterality: N/A;  . PERIPHERAL VASCULAR CATHETERIZATION  06/01/2016   Procedure: Lower Extremity Intervention;  Surgeon: Algernon Huxley, MD;  Location: [place] [facility] LAB;  Service: Cardiovascular;;  . PERIPHERAL VASCULAR CATHETERIZATION Right 06/08/2016   Procedure: Lower Extremity Angiography;  Surgeon: Algernon Huxley, MD;  Location: [place] [facility];  Service: Cardiovascular;  Laterality: Right;  . PERIPHERAL VASCULAR CATHETERIZATION  06/08/2016   Procedure: Lower Extremity  Intervention;  Surgeon: Algernon Huxley, MD;  Location: [place] [facility];  Service: Cardiovascular;;    Prior to Admission medications   Medication Sig Start Date End Date Taking? Authorizing Provider  albuterol (PROAIR HFA) 108 (90 BASE) MCG/ACT inhaler Inhale 2 puffs into the lungs every 4 (four) hours as needed for wheezing or shortness of breath.    Yes [provider]  albuterol (PROVENTIL) (2.5 MG/3ML) 0.083% nebulizer solution Take 2.5 mg by nebulization every 6 (six) hours as needed for wheezing.    Yes [provider]  amLODipine (NORVASC) 10 MG tablet Take 1 tablet (10 mg total) by mouth every morning. 09/17/17  Yes Ronnell Freshwater, NP  aspirin EC 81 MG tablet Take 1 tablet (81 mg total) by mouth daily. 06/01/16  Yes Algernon Huxley, MD  buPROPion (WELLBUTRIN XL) 150 MG 24 hr tablet TAKE 1 TABLET BY MOUTH TWICE DAILY FOR SMOKING CESSATION 12/15/17  Yes Boscia, Greer Ee, NP  clopidogrel (PLAVIX) 75 MG tablet TAKE ONE TABLET BY MOUTH ONCE DAILY 05/17/17  Yes Dew, Erskine Squibb, MD  gabapentin (NEURONTIN) 100 MG capsule Take 1 capsule (100 mg total) by mouth 2 (two) times daily. 11/22/17  Yes Boscia, Greer Ee, NP  lisinopril-hydrochlorothiazide (PRINZIDE,ZESTORETIC) 10-12.5 MG per tablet Take 1 tablet by mouth every morning.   Yes [provider]  meloxicam (MOBIC) 7.5 MG tablet Take 1 tablet (7.5 mg total) by mouth 2 (two) times daily. 09/23/17  Yes Boscia, Heather E, NP  mometasone-formoterol (DULERA) 200-5 MCG/ACT AERO Inhale 1 puff into the lungs 2 (two) times daily. 09/17/17  Yes Boscia, Greer Ee, NP  OXYGEN Inhale 2 L into the lungs at bedtime.   Yes [provider]  potassium chloride (K-DUR) 10 MEQ tablet TAKE 1 TABLET BY MOUTH EVERY OTHER DAY FOR  LOW  POTASSIUM 12/15/17  Yes Boscia, Heather E, NP  rOPINIRole (REQUIP) 0.5 MG tablet Take 1 tablet (0.5 mg total) by mouth 2 (two) times daily. 11/15/17  Yes Boscia, Greer Ee, NP  rosuvastatin (CRESTOR) 10 MG tablet Take  1 tablet (10 mg total) by mouth at bedtime. 09/30/17  Yes Boscia, Greer Ee, NP  SitaGLIPtin-MetFORMIN HCl (JANUMET XR) 50-500 MG TB24 Take 1 tablet by mouth daily with breakfast.    Yes [provider]  tiotropium (SPIRIVA HANDIHALER) 18 MCG inhalation capsule Place 1 capsule (18 mcg total) into inhaler and inhale daily. 09/17/17  Yes Boscia, Heather E, NP  glucose blood (ACCU-CHEK AVIVA PLUS) test strip Use as instructed to check blood sugars three times daily.  E11.65 09/17/17   Ronnell Freshwater, NP    Allergies Patient has no known allergies.  Family History  Problem Relation Age of Onset  . Lung cancer Father   . Diabetes Mother   . Diabetes Sister   . Diabetes Maternal Grandmother   . Diabetes Paternal Grandmother   . Diabetes Sister     Social History Social History   Tobacco Use  .  Smoking status: Former Smoker    Packs/day: 0.90    Years: 37.00    Pack years: 33.30    Types: Cigarettes    Last attempt to quit: 02/06/2010    Years since quitting: 8.0  . Smokeless tobacco: Former Systems developer    Types: Snuff  Substance Use Topics  . Alcohol use: No    Comment: hx of etoh use   . Drug use: Yes    Types: Marijuana    Comment: hx of cocaine use- last use 2015; last use sat marijuana6/22/19,   Occasional marijuana use  Review of Systems Constitutional: No fever/chills Eyes: No visual changes. ENT: No sore throat. Cardiovascular: Denies chest pain. Respiratory: Denies shortness of breath.  See HPI Gastrointestinal: No abdominal pain.   Skin: Negative for rash. Neurological: Negative for headaches, focal weakness or numbness.    ____________________________________________   PHYSICAL EXAM:  VITAL SIGNS: ED Triage Vitals [02/11/18 1012]  Enc Vitals Group     BP (!) 130/92     Pulse Rate (!) 102     Resp 16     Temp 98.6 F (37 C)     Temp Source Oral     SpO2 96 %     Weight 112 lb (50.8 kg)     Height 4\' 11"  (1.499 m)     Head Circumference      Peak  Flow      Pain Score 0     Pain Loc      Pain Edu?      Excl. in [place]?     Constitutional: Alert and oriented. Well appearing and in no acute distress. Eyes: Conjunctivae are normal. Head: Atraumatic. Nose: No congestion/rhinnorhea. Mouth/Throat: Mucous membranes are moist. Neck: No stridor.   Cardiovascular: Normal rate, regular rhythm. Grossly normal heart sounds.  Good peripheral circulation. Respiratory: Normal respiratory effort.  No retractions. Lungs CTAB except for some very slight crackles in the right lower base.  Inspection of the right side of the chest, there is no lesion redness or clear biopsy insertion site.  She is speaking in full clear sentences.  She has a small cup that she has some small bits of sputum with slight clot and redness in it, no major hemorrhage. Gastrointestinal: Soft and nontender. No distention. Musculoskeletal: No lower extremity tenderness nor edema. Neurologic:  Normal speech and language. No gross focal neurologic deficits are appreciated.  Skin:  Skin is warm, dry and intact. No rash noted. Psychiatric: Mood and affect are normal. Speech and behavior are normal.  ____________________________________________   LABS (all labs ordered are listed, but only abnormal results are displayed)  Labs Reviewed  BASIC METABOLIC PANEL - Abnormal; Notable for the following components:      Result Value   Glucose, Bld 100 (*)    All other components within normal limits  CBC   ____________________________________________  EKG   ____________________________________________  RADIOLOGY  Dg Chest 2 View  Result Date: 02/11/2018 CLINICAL DATA:  Hemoptysis after right lung biopsy. EXAM: CHEST - 2 VIEW COMPARISON:  02/09/2018 and 07/31/2016 FINDINGS: Interval significant partial clearing of the alveolar hemorrhage in the right lower lobe. No pneumothorax. Chronic thickening of the minor fissure on the right. Left lung is clear.  Heart size and vascularity  are normal. No effusions.  No bone abnormality. IMPRESSION: Significant partial clearing of the alveolar hemorrhage in the right lower lobe. Some hemorrhage remains. Electronically Signed   By: Lorriane Shire M.D.   On: 02/11/2018 10:51  Chest x-ray reviewed by me  Chest x-ray appears improved, resolving hemorrhage  ____________________________________________   PROCEDURES  Procedure(s) performed: None  Procedures  Critical Care performed: No  ____________________________________________   INITIAL IMPRESSION / ASSESSMENT AND PLAN / ED COURSE  Pertinent labs & imaging results that were available during my care of the patient were reviewed by me and considered in my medical decision making (see chart for details).  Patient returns for evaluation of hemoptysis.  Appears she is having small amounts of hemoptysis post biopsy, but does not history any hypoxia or increased work of breathing.  Her hemoglobin is stable, and she certainly has no evidence of massive hemoptysis.  Currently off of her antiplatelet therapy and not on any other anticoagulant.  Her symptoms seem to be slowly resolving now producing somewhat darkish small amounts of clot in her sputum, but no evidence of hypoxia or worsening symptoms and her chest x-ray appears improved.  No associated cardiac symptoms.  Very reassuring clinical examination, nontoxic stable appearing.   Clinical Course as of Feb 11 1502  Fri Feb 11, 2018  1331 Discussed with Dr. Janece Canterbury, recommends discharge to home with follow-up, but consider discuss with pulmonary prior to discharge.    [MQ]    Clinical Course User Index [MQ] Delman Kitten, MD    ----------------------------------------- 3:02 PM on 02/11/2018 -----------------------------------------  Case x-ray, clinical history discussed with Dr. Raul Del of pulmonary.  He advises the patient be discharged, outpatient follow-up.  Will have patient follow-up with her primary and  oncology services.  Return precautions and treatment recommendations and follow-up discussed with the patient who is agreeable with the plan.    ____________________________________________   FINAL CLINICAL IMPRESSION(S) / ED DIAGNOSES  Final diagnoses:  Cough with hemoptysis      NEW MEDICATIONS STARTED DURING THIS VISIT:  New Prescriptions   No medications on file     Note:  This document was prepared using Dragon voice recognition software and may include unintentional dictation errors.     Delman Kitten, MD 02/11/18 [phone]

## 2018-02-11 NOTE — ED Notes (Signed)
First Nurse Note:  Patient states she had lung biopsy here 2 days ago.  Spitting up "dark blood".

## 2018-02-13 ENCOUNTER — Other Ambulatory Visit: Payer: Self-pay | Admitting: Internal Medicine

## 2018-02-14 ENCOUNTER — Inpatient Hospital Stay: Payer: BLUE CROSS/BLUE SHIELD | Attending: Hematology and Oncology | Admitting: Hematology and Oncology

## 2018-02-14 ENCOUNTER — Encounter: Payer: Self-pay | Admitting: Hematology and Oncology

## 2018-02-14 ENCOUNTER — Other Ambulatory Visit: Payer: Self-pay | Admitting: Nurse Practitioner

## 2018-02-14 VITALS — BP 124/85 | HR 94 | Temp 97.9°F | Resp 18 | Wt 107.3 lb

## 2018-02-14 DIAGNOSIS — C3411 Malignant neoplasm of upper lobe, right bronchus or lung: Secondary | ICD-10-CM

## 2018-02-14 DIAGNOSIS — Z86010 Personal history of colonic polyps: Secondary | ICD-10-CM | POA: Diagnosis not present

## 2018-02-14 DIAGNOSIS — R042 Hemoptysis: Secondary | ICD-10-CM | POA: Diagnosis not present

## 2018-02-14 DIAGNOSIS — Z923 Personal history of irradiation: Secondary | ICD-10-CM | POA: Diagnosis not present

## 2018-02-14 DIAGNOSIS — Z85118 Personal history of other malignant neoplasm of bronchus and lung: Secondary | ICD-10-CM | POA: Diagnosis not present

## 2018-02-14 DIAGNOSIS — C3431 Malignant neoplasm of lower lobe, right bronchus or lung: Secondary | ICD-10-CM

## 2018-02-14 DIAGNOSIS — Z7189 Other specified counseling: Secondary | ICD-10-CM

## 2018-02-14 MED ORDER — POTASSIUM CHLORIDE ER 10 MEQ PO TBCR
EXTENDED_RELEASE_TABLET | ORAL | 2 refills | Status: DC
Start: 2018-02-14 — End: 2018-05-18

## 2018-02-14 MED ORDER — ALBUTEROL SULFATE (2.5 MG/3ML) 0.083% IN NEBU: 2.5000 mg | INHALATION_SOLUTION | Freq: Four times a day (QID) | RESPIRATORY_TRACT | 3 refills | Status: DC | PRN

## 2018-02-14 NOTE — Progress Notes (Signed)
[facility] day:  02/14/2018   Chief Complaint: Angela Jensen is a 62 y.o. female with a history of stage I lung cancer who is seen for assessment after interval lung biopsy and discussion regarding direction of therapy.  HPI: The patient was last seen in the medical oncology clinic on 01/21/2018.  At that time, she felt "fine".  She had stable exertional dyspnea.  Exam was stable.  Chest CT had revealed a RLL nodule suspicious for malignancy.  She was previously determined not to be a surgical candidate.  She was presented at tumor board.  Decision was made to pursue a biopsy.  She underwent CT guided RLL biopsy on 02/09/2018.  Pathology revealed adenocarcinoma of lung origin, lepidic and papillary patterns.  Post biopsy, CXR revealed alveolar hemorrhage.  During the interim, patient continues to have hemoptysis s/p recent lung mass biopsy. She denies increased shortness of breath at rest. Exertional dyspnea continues. She has no other acute concerns.    Patient denies that she has experienced any B symptoms. She denies any interval infections. Patient advises that she maintains an adequate appetite. She is eating well. Weight today is 107 lb 5 oz (48.7 kg), which compared to her last visit to the clinic, represents a  5 pound decrease.   Patient denies pain in the clinic today.   Past Medical History:  Diagnosis Date  . Arthritis   . Asthma   . Atherosclerosis of native arteries of extremity with intermittent claudication ([place]) 05/26/2016  . Benign neoplasm of sigmoid colon   . Cancer [facility]) 2012   Right Lung CA  . COPD (chronic obstructive pulmonary disease) ([place])   . Depression   . Diabetes mellitus without complication [facility])    Patient takes Janumet  . Essential hypertension 05/26/2016  . Hypercholesteremia   . Hypertension   . 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 PAD (peripheral artery disease) ([place]) 06/22/2016  . Peripheral vascular  disease ([place])   . Shortness of breath dyspnea    with exertion     Past Surgical History:  Procedure Laterality Date  . CESAREAN SECTION     x3  . COLONOSCOPY WITH PROPOFOL N/A 06/25/2015   Procedure: COLONOSCOPY WITH PROPOFOL;  Surgeon: Lucilla Lame, MD;  Location: [facility] ENDOSCOPY;  Service: Endoscopy;  Laterality: N/A;  . CYST REMOVAL LEG     and on shoulder   . LUNG BIOPSY  05 15 2013   has lung "spots"  . PERIPHERAL VASCULAR CATHETERIZATION Left 06/01/2016   Procedure: Lower Extremity Angiography;  Surgeon: Algernon Huxley, MD;  Location: [place] CV [facility];  Service: Cardiovascular;  Laterality: Left;  . PERIPHERAL VASCULAR CATHETERIZATION N/A 06/01/2016   Procedure: Abdominal Aortogram w/Lower Extremity;  Surgeon: Algernon Huxley, MD;  Location: [place] CV LAB;  Service: Cardiovascular;  Laterality: N/A;  . PERIPHERAL VASCULAR CATHETERIZATION  06/01/2016   Procedure: Lower Extremity Intervention;  Surgeon: Algernon Huxley, MD;  Location: [place] CV LAB;  Service: Cardiovascular;;  . PERIPHERAL VASCULAR CATHETERIZATION Right 06/08/2016   Procedure: Lower Extremity Angiography;  Surgeon: Algernon Huxley, MD;  Location: [place] CV [facility];  Service: Cardiovascular;  Laterality: Right;  . PERIPHERAL VASCULAR CATHETERIZATION  06/08/2016   Procedure: Lower Extremity Intervention;  Surgeon: Algernon Huxley, MD;  Location: [place] CV LAB;  Service: Cardiovascular;;    Family History  Problem Relation Age of Onset  . Lung cancer Father   .  Diabetes Mother   . Diabetes Sister   . Diabetes Maternal Grandmother   . Diabetes Paternal Grandmother   . Diabetes Sister     Social History:  reports that she quit smoking about 8 years ago. Her smoking use included cigarettes. She has a 33.30 pack-year smoking history. She has quit using smokeless tobacco. Her smokeless tobacco use included snuff. She reports that she has current or past drug history. Drug: Marijuana. She reports that she does  not drink alcohol.  She has not smoked for 1 month.  She started smoking at 17 until she was "62 something".  She lives in [place].  The patient is alone today.  Allergies: No Known Allergies  Current Medications: Current Outpatient Medications  Medication Sig Dispense Refill  . albuterol (PROVENTIL) (2.5 MG/3ML) 0.083% nebulizer solution Take 3 mLs (2.5 mg total) by nebulization every 6 (six) hours as needed for wheezing. 75 mL 3  . amLODipine (NORVASC) 10 MG tablet Take 1 tablet (10 mg total) by mouth every morning. 30 tablet 5  . aspirin EC 81 MG tablet Take 1 tablet (81 mg total) by mouth daily. 150 tablet 2  . buPROPion (WELLBUTRIN XL) 150 MG 24 hr tablet TAKE 1 TABLET BY MOUTH TWICE DAILY FOR SMOKING CESSATION 60 tablet 6  . clopidogrel (PLAVIX) 75 MG tablet TAKE ONE TABLET BY MOUTH ONCE DAILY 30 tablet 11  . gabapentin (NEURONTIN) 100 MG capsule Take 1 capsule (100 mg total) by mouth 2 (two) times daily. 60 capsule 5  . glucose blood (ACCU-CHEK AVIVA PLUS) test strip Use as instructed to check blood sugars three times daily.  E11.65 250 each 5  . lisinopril-hydrochlorothiazide (PRINZIDE,ZESTORETIC) 10-12.5 MG per tablet Take 1 tablet by mouth every morning.    . meloxicam (MOBIC) 7.5 MG tablet Take 1 tablet (7.5 mg total) by mouth 2 (two) times daily. 60 tablet 3  . mometasone-formoterol (DULERA) 200-5 MCG/ACT AERO Inhale 1 puff into the lungs 2 (two) times daily. 1 Inhaler 5  . OXYGEN Inhale 2 L into the lungs at bedtime.    . potassium chloride (K-DUR) 10 MEQ tablet TAKE 1 TABLET BY MOUTH EVERY OTHER DAY FOR  LOW  POTASSIUM 15 tablet 2  . PROAIR HFA 108 (90 Base) MCG/ACT inhaler INHALE 2 PUFFS BY MOUTH EVERY 4 TO 6 HOURS AS NEEDED FOR SHORTNESS OF BREATH 9 each 6  . rOPINIRole (REQUIP) 0.5 MG tablet Take 1 tablet (0.5 mg total) by mouth 2 (two) times daily. 60 tablet 2  . rosuvastatin (CRESTOR) 10 MG tablet Take 1 tablet (10 mg total) by mouth at bedtime. 30 tablet 5  .  SitaGLIPtin-MetFORMIN HCl (JANUMET XR) 50-500 MG TB24 Take 1 tablet by mouth daily with breakfast.     . tiotropium (SPIRIVA HANDIHALER) 18 MCG inhalation capsule Place 1 capsule (18 mcg total) into inhaler and inhale daily. 30 capsule 5   No current facility-administered medications for this visit.     Review of Systems  Constitutional: Negative.  Negative for diaphoresis, fever, malaise/fatigue and weight loss (up and down by 3-5 pounds).       I feel fine.  HENT: Negative.  Negative for congestion, ear discharge, ear pain, nosebleeds, sinus pain and sore throat.   Eyes: Negative.  Negative for double vision, photophobia, pain and discharge.  Respiratory: Positive for cough, hemoptysis (s/p biopsy) and shortness of breath (exertional- no change). Negative for sputum production.        Scant hemoptysis since CT guided biopsy.  Cardiovascular: Negative for chest pain, palpitations, orthopnea, leg swelling and PND.  Gastrointestinal: Negative.  Negative for abdominal pain, blood in stool, constipation, diarrhea, melena, nausea and vomiting.  Genitourinary: Negative.  Negative for dysuria, frequency, hematuria and urgency.  Musculoskeletal: Positive for joint pain (LEFT shoulder). Negative for back pain, falls and myalgias.  Skin: Negative.  Negative for itching and rash.  Neurological: Negative.  Negative for dizziness, tremors, weakness and headaches.  Endo/Heme/Allergies: Negative.  Does not bruise/bleed easily.       Diabetes  Psychiatric/Behavioral: Negative for depression, memory loss and suicidal ideas. The patient is not nervous/anxious and does not have insomnia.   All other systems reviewed and are negative.  Performance status (ECOG): 1 - Symptomatic but completely ambulatory  Physical Exam: Blood pressure 124/85, pulse 94, temperature 97.9 F (36.6 C), temperature source Tympanic, resp. rate 18, weight 107 lb 5 oz (48.7 kg), SpO2 94 %. GENERAL:  Thin woman sitting comfortably  in the exam room in no acute distress. MENTAL STATUS:  Alert and oriented to person, place and time. HEAD:  Pearline Cables hair.  Normocephalic, atraumatic, face symmetric, no Cushingoid features. EYES:  Glasses.  Brown eyes.  No conjunctivitis or scleral icterus. NEUROLOGICAL: Unremarkable. PSYCH:  Appropriate.    No visits with results within 3 Day(s) from this visit.  Latest known visit with results is:  Admission on 02/11/2018, Discharged on 02/11/2018  Component Date Value Ref Range Status  . WBC 02/11/2018 3.9  3.6 - 11.0 K/uL Final  . RBC 02/11/2018 4.09  3.80 - 5.20 MIL/uL Final  . Hemoglobin 02/11/2018 12.1  12.0 - 16.0 g/dL Final  . HCT 02/11/2018 36.5  35.0 - 47.0 % Final  . MCV 02/11/2018 89.1  80.0 - 100.0 fL Final  . MCH 02/11/2018 29.6  26.0 - 34.0 pg Final  . MCHC 02/11/2018 33.2  32.0 - 36.0 g/dL Final  . RDW 02/11/2018 14.1  11.5 - 14.5 % Final  . Platelets 02/11/2018 244  150 - 440 K/uL Final   Performed at [facility], [address], [place], [place] [address]  . Sodium 02/11/2018 138  135 - 145 mmol/L Final  . Potassium 02/11/2018 4.2  3.5 - 5.1 mmol/L Final  . Chloride 02/11/2018 103  98 - 111 mmol/L Final   Please note change in reference range.  . CO2 02/11/2018 27  22 - 32 mmol/L Final  . Glucose, Bld 02/11/2018 100* 70 - 99 mg/dL Final   Please note change in reference range.  . BUN 02/11/2018 14  8 - 23 mg/dL Final   Please note change in reference range.  . Creatinine, Ser 02/11/2018 0.65  0.44 - 1.00 mg/dL Final  . Calcium 02/11/2018 9.4  8.9 - 10.3 mg/dL Final  . GFR calc non Af Amer 02/11/2018 >60  >60 mL/min Final  . GFR calc Af Amer 02/11/2018 >60  >60 mL/min Final   Comment: (NOTE) The eGFR has been calculated using the CKD EPI equation. This calculation has not been validated in all clinical situations. eGFR's persistently <60 mL/min signify possible Chronic Kidney Disease.   Georgiann Hahn gap 02/11/2018 8  5 - 15 Final   Performed at [facility], [address] [place], [place] [address]    Assessment:  Angela Jensen is a 62 y.o. 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   She was not a good surgery candidate secondary to her lung function (  FEV1 28%).  Chest CT on 07/31/2011 revealed a 1.1 x 0.6 cm RUL nodule and a 9 mm inferior aspect RUL nodule and a 4 mm nodule in the anterior aspect of the inferior aspect of the RUL.  PET scan on 08/03/2011 revealed a 10-mm spiculated right apical pulmonary nodule (SUV 2.5) suspcious for lung cancer. Left ovary was prominent (3.9 x 2.4 cm).   CA-125 was 9.3 (normal).  CT guided biopsy of the superior and inferior RUL nodules on 12/30/2011 revealed well differentiated adenocarcinoma of lung primary.  EGFR testing revealed no mutation.  She received IMRT 6000 cGy to both nodules from 01/26/2012 - 03/10/2012.  PET scan on 01/30/2015 revealed a hypermetabolic and enlarging RUL pulmonary nodules concerning for lung cancer recurrence.  There was a small focus of peripheral nodular consolidation in the superior segment of the right lower lobe with differential including inflammation/infection versus neoplasm.  There was no evidence of mediastinal nodal metastasis.    PET scan on 03/27/2016 revealed no hypermetabolic activity to suggest recurrent malignancy. There was a soft tissue density in the left pelvis near the inguinal ring that was not hypermetabolic, but prominent and probably separate from the left ovary. This could be a wandering fibroid.  Although the overall appearance had not changed from 2012 and was not hypermetabolic, pelvic sonography or pelvic MRI for further characterization of the uterus and ovaries can be considered.  In 04/2015, there was an attempt at radiofrequency ablation of 1 enlarging RUL nodule.  At the time of the procedure, the nodule had shrunk to 5 mm from 9 mm.    Chest CT on 08/01/2015 revealed complete resolution  of the prior nodule in the right lung apex.   Chest CT on 01/31/2016 revealed no active cardiopulmonary abnormalities.  There was stable appearance of right apical and right middle lobe scar like densities.   Low dose chest CT on 02/02/2017 revealed new ground-glass nodule in the right lower lobe. Lung-RADS 3 (probably benign findings).   Low dose chest CT  on 07/29/2017 demonstrated enlargement of the previously observed sub-solid pulmonary lesion. RIGHT upper lobe lesion measured 1.4 x 2.1 x 1.6 cm (previously 1.5 x 1.1 x 1.7 cm). There was also areas of focal architectural distortion in the RIGHT upper and middle lobes that were felt to be most compatible with areas of chronic post infectious or inflammatory scarring. Aortic value calcifications were noted. Lung-RADS 4BS (suspicious).  Chest CT on 01/19/2018 revealed a subsolid 2.1 cm peripheral right lower lobe pulmonary nodule.  Although not appreciably changed in size, subjectively this nodule had increased in density, and remains suspicious for primary bronchogenic adenocarcinoma.  There was no thoracic adenopathy.  CT guided RLL biopsy on 02/09/2018 revealed adenocarcinoma of lung origin, lepidic and papillary patterns.    She has an elevated CEA of unclear etiology.  CEA was 8.1 on 01/31/2016, 9.2 on 03/03/2016, and 7.9 on 10/12/2017.  Colonoscopy on 06/25/2015 revealed a 4 mm polyp in the sigmoid colon.  Pathology revealed a tubular adenoma without evidence of dysplasia or malignancy.  Pelvic MRI on 04/30/2016 revealed a 3.4 x 2.5 cm ovoid mass in the LEFT adnexa with signal characteristics identical to the intramural leiomyomas and consistent with an exophytic leiomyoma.  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  Endometrium was normal with a normal junctional zone.  Ovaries were not identified.  Symptomatically, she feels "fine".  Patient has hemoptysis following her recent biopsy. She has stable exertional dyspnea.  Exam is stable.  Plan: 1. Discuss RLL lung biopsy- adenocarcinoma of the lung. 2. Discuss plans for stereotactic radiation.  3. Refer to radiation oncology (Dr. Baruch Gouty) for stereotactic radiosurgery. 4. Discuss hemoptysis-  Anticipate continued daily improvement.  If worsening, contact clinic. 5. RTC 1 month following radiation. Patient to call for an appointment.   Addendum:  Update patient contact information [phone] - [name] - boyfriend).    Honor Loh, NP  02/14/2018, 4:25 PM   I saw and evaluated the patient, participating in the key portions of the service and reviewing pertinent diagnostic studies and records.  I reviewed the nurse practitioner's note and agree with the findings and the plan.  The assessment and plan were discussed with the patient.  Several questions were asked by the patient and answered.   Nolon Stalls, MD 02/14/2018, 4:25 PM

## 2018-02-14 NOTE — Progress Notes (Signed)
Pt in for follow up, was in ED over weekend with bloody sputum production after biopsy.  Pt stated " more than I thought it would be" reports still coughing up some and is dark red blackish looking.  Pt verbalizing being anxious about biopsy results.

## 2018-03-02 ENCOUNTER — Encounter: Payer: Self-pay | Admitting: Radiation Oncology

## 2018-03-02 ENCOUNTER — Other Ambulatory Visit: Payer: Self-pay

## 2018-03-02 ENCOUNTER — Ambulatory Visit
Admission: RE | Admit: 2018-03-02 | Discharge: 2018-03-02 | Disposition: A | Discharge status: Home / Self Care | Payer: BLUE CROSS/BLUE SHIELD | Source: Ambulatory Visit | Attending: Radiation Oncology | Admitting: Radiation Oncology

## 2018-03-02 VITALS — BP 152/90 | HR 84 | Temp 96.7°F | Resp 18 | Ht 59.45 in | Wt 109.6 lb

## 2018-03-02 DIAGNOSIS — C3431 Malignant neoplasm of lower lobe, right bronchus or lung: Secondary | ICD-10-CM | POA: Diagnosis present

## 2018-03-02 DIAGNOSIS — Z85118 Personal history of other malignant neoplasm of bronchus and lung: Secondary | ICD-10-CM | POA: Insufficient documentation

## 2018-03-02 NOTE — Progress Notes (Signed)
Radiation Oncology Follow up Note  Name: Angela Jensen   Date:   03/02/2018 MRN:  [id number] DOB: 01/19/1956    This 62 y.o. female presents to the clinic today for follow-up for a right lung lesion inpatient initially treated 6 years prior for non-small cell lung carcinoma the right upper lobe now with CT evidence of progression as well as positive pathology.  REFERRING PROVIDER: Ronnell Freshwater, NP  HPI: patient is a 62 year old female presented treated 6 years prior for a small non-small cell lung cancer of the right upper lobe. We've been watching area of increasing consolidation in the right lung.I try to obtain a CT-guided biopsy about 6 months ago although the lesion was too small for radiology to address.Marland Kitchenepeat CT scan last month showed  Progression of a 2.1 cm peripheral right lower lobe pulmonary nodule with increased density for which CT-guided biopsy was recommended.biopsy was positive for adenocarcinoma of lung origin with lipidic and papillary patterns. She continues to be asymptomatic. She specifically denies cough hemoptysis or chest tightness.atient has seen Dr. Faith Rogue who based on her primary functions and previous treatments has declined surgical intervention.  COMPLICATIONS OF TREATMENT: none  FOLLOW UP COMPLIANCE: keeps appointments   PHYSICAL EXAM:  BP (!) 152/90 (BP Location: Right Arm, Patient Position: Sitting, Cuff Size: Normal)   Pulse 84   Temp (!) 96.7 F (35.9 C) (Tympanic)   Resp 18   Ht 4' 11.45" (1.51 m)   Wt 109 lb 9.1 oz (49.7 kg)   BMI 21.80 kg/m  Well-developed well-nourished patient in NAD. HEENT reveals PERLA, EOMI, discs not visualized.  Oral cavity is clear. No oral mucosal lesions are identified. Neck is clear without evidence of cervical or supraclavicular adenopathy. Lungs are clear to A&P. Cardiac examination is essentially unremarkable with regular rate and rhythm without murmur rub or thrill. Abdomen is benign with no organomegaly or masses  noted. Motor sensory and DTR levels are equal and symmetric in the upper and lower extremities. Cranial nerves II through XII are grossly intact. Proprioception is intact. No peripheral adenopathy or edema is identified. No motor or sensory levels are noted. Crude visual fields are within normal range.  RADIOLOGY RESULTS: erial CT scans are reviewed.  PLAN: this time I to go ahead with SB RT to her right lower lobe lesion. Would plan on delivering 6000 cGy in 5 fractions. Risks and benefits of treatment including possible developed cough fatigue possibly some pleuritic type chest pain from his close proximity to the chest wall all were discussed in detail with the patient. I have personally set up and ordered CT simulation for next week. Patient seems to comprehend my treatment plan well. Will review her old treatment plan for possibility of her overall overlap of treatment fields.  I would like to take this opportunity to thank you for allowing me to participate in the care of your patient.Noreene Filbert, MD

## 2018-03-02 NOTE — Progress Notes (Signed)
Here for new pt screening. Per pt feeling " fine today"

## 2018-03-07 ENCOUNTER — Ambulatory Visit
Admission: RE | Admit: 2018-03-07 | Discharge: 2018-03-07 | Disposition: A | Discharge status: Home / Self Care | Payer: BLUE CROSS/BLUE SHIELD | Source: Ambulatory Visit | Attending: Radiation Oncology | Admitting: Radiation Oncology

## 2018-03-07 DIAGNOSIS — Z510 Encounter for antineoplastic radiation therapy: Secondary | ICD-10-CM | POA: Insufficient documentation

## 2018-03-07 DIAGNOSIS — C3431 Malignant neoplasm of lower lobe, right bronchus or lung: Secondary | ICD-10-CM | POA: Diagnosis not present

## 2018-03-09 DIAGNOSIS — C3431 Malignant neoplasm of lower lobe, right bronchus or lung: Secondary | ICD-10-CM | POA: Diagnosis not present

## 2018-03-15 ENCOUNTER — Other Ambulatory Visit: Payer: Self-pay

## 2018-03-15 MED ORDER — AMLODIPINE BESYLATE 10 MG PO TABS: 10.0000 mg | ORAL_TABLET | Freq: Every morning | ORAL | 5 refills | Status: DC

## 2018-03-15 MED ORDER — ROPINIROLE HCL 0.5 MG PO TABS: 0.5000 mg | ORAL_TABLET | Freq: Two times a day (BID) | ORAL | 2 refills | Status: DC

## 2018-03-18 ENCOUNTER — Ambulatory Visit (INDEPENDENT_AMBULATORY_CARE_PROVIDER_SITE_OTHER): Payer: BLUE CROSS/BLUE SHIELD

## 2018-03-18 ENCOUNTER — Encounter (INDEPENDENT_AMBULATORY_CARE_PROVIDER_SITE_OTHER): Payer: Self-pay | Admitting: Vascular Surgery

## 2018-03-18 ENCOUNTER — Ambulatory Visit (INDEPENDENT_AMBULATORY_CARE_PROVIDER_SITE_OTHER): Payer: BLUE CROSS/BLUE SHIELD | Admitting: Vascular Surgery

## 2018-03-18 VITALS — BP 129/87 | HR 101 | Resp 16 | Ht 59.0 in | Wt 110.2 lb

## 2018-03-18 DIAGNOSIS — E785 Hyperlipidemia, unspecified: Secondary | ICD-10-CM

## 2018-03-18 DIAGNOSIS — E118 Type 2 diabetes mellitus with unspecified complications: Secondary | ICD-10-CM

## 2018-03-18 DIAGNOSIS — I739 Peripheral vascular disease, unspecified: Secondary | ICD-10-CM

## 2018-03-18 DIAGNOSIS — I70213 Atherosclerosis of native arteries of extremities with intermittent claudication, bilateral legs: Secondary | ICD-10-CM

## 2018-03-18 NOTE — Progress Notes (Signed)
Subjective:    [name], female    DOB: 27-May-1956, 62 y.o.   MRN: [id number] Chief Complaint  Patient presents with  . Follow-up    38month abi   Patient presents for six-month peripheral artery disease follow-up.  The patient presents today without any complaints to the left lower extremity.  The patient continues to experience some right lower extremity weakness.  Patient denies any rest pain or ulcer formation to the bilateral lower extremity.  The patient underwent a bilateral ABI which was notable for: RIGHT: 1.02, triphasic tibials.  Normal great toe waveforms. LEFT: 0.75, monophasic tibials with abnormal great toe waveforms.  She denies any bilateral lower examinee edema.  Patient denies any fever, nausea vomiting.  Review of Systems  Constitutional: Negative.   HENT: Negative.   Eyes: Negative.   Respiratory: Negative.   Cardiovascular:       Right lower extremity claudication  Gastrointestinal: Negative.   Endocrine: Negative.   Genitourinary: Negative.   Musculoskeletal: Negative.   Skin: Negative.   Allergic/Immunologic: Negative.   Neurological: Negative.   Hematological: Negative.   Psychiatric/Behavioral: Negative.       Objective:   Physical Exam  Constitutional: She is oriented to person, place, and time. She appears well-developed and well-nourished. No distress.  HENT:  Head: Normocephalic and atraumatic.  Right Ear: External ear normal.  Left Ear: External ear normal.  Eyes: Pupils are equal, round, and reactive to light. Conjunctivae and EOM are normal.  Neck: Normal range of motion.  Cardiovascular: Normal rate, regular rhythm, normal heart sounds and intact distal pulses.  Pulses:      Radial pulses are 2+ on the right side, and 2+ on the left side.       Dorsalis pedis pulses are 2+ on the right side, and 1+ on the left side.       Posterior tibial pulses are 2+ on the right side, and 1+ on the left side.  Pulmonary/Chest: Effort  normal and breath sounds normal.  Musculoskeletal: Normal range of motion. She exhibits no edema.  Neurological: She is alert and oriented to person, place, and time.  Skin: Skin is warm and dry. She is not diaphoretic.  Psychiatric: She has a normal mood and affect. Her behavior is normal. Judgment and thought content normal.  Vitals reviewed.  BP 129/87 (BP Location: Right Arm)   Pulse (!) 101   Resp 16   Ht 4\' 11"  (1.499 m)   Wt 110 lb 3.2 oz (50 kg)   BMI 22.26 kg/m   Past Medical History:  Diagnosis Date  . Arthritis   . Asthma   . Atherosclerosis of native arteries of extremity with intermittent claudication ([place]) 05/26/2016  . Benign neoplasm of sigmoid colon    pt denies   . Cancer [facility]) 2012   Right Lung CA  . COPD (chronic obstructive pulmonary disease) ([place])   . Depression   . Diabetes mellitus without complication [facility])    Patient takes Janumet  . Essential hypertension 05/26/2016  . Hypercholesteremia   . Hypertension   . 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 PAD (peripheral artery disease) ([place]) 06/22/2016  . Peripheral vascular disease ([place])   . Shortness of breath dyspnea    with exertion    Social History   Socioeconomic History  . Marital status: Widowed    Spouse name: Not on file  . Number of children: Not on file  . Years  of education: Not on file  . Highest education level: Not on file  Occupational History  . Not on file  Social Needs  . Financial resource strain: Not on file  . Food insecurity:    Worry: Not on file    Inability: Not on file  . Transportation needs:    Medical: Not on file    Non-medical: Not on file  Tobacco Use  . Smoking status: Former Smoker    Packs/day: 1.00    Years: 37.00    Pack years: 37.00    Types: Cigarettes    Last attempt to quit: 02/06/2010    Years since quitting: 8.1  . Smokeless tobacco: Former Systems developer    Types: Snuff  Substance and Sexual Activity  . Alcohol use: Yes    Alcohol/week: 3.0 oz     Types: 5 Cans of beer per week    Comment: /h x of alcohol abuse -stopped 2012- now drinks 5 beer per w  . Drug use: Yes    Types: Marijuana, "Crack" cocaine, Cocaine    Comment: hx of cocaine use- last use 2015; last use sat marijuana6/22/19,   . Sexual activity: Yes  Lifestyle  . Physical activity:    Days per week: Not on file    Minutes per session: Not on file  . Stress: Not on file  Relationships  . Social connections:    Talks on phone: Not on file    Gets together: Not on file    Attends religious service: Not on file    Active member of club or organization: Not on file    Attends meetings of clubs or organizations: Not on file    Relationship status: Not on file  . Intimate partner violence:    Fear of current or ex partner: Not on file    Emotionally abused: Not on file    Physically abused: Not on file    Forced sexual activity: Not on file  Other Topics Concern  . Not on file  Social History Narrative  . Not on file   Past Surgical History:  Procedure Laterality Date  . CESAREAN SECTION     x3  . COLONOSCOPY WITH PROPOFOL N/A 06/25/2015   Procedure: COLONOSCOPY WITH PROPOFOL;  Surgeon: Lucilla Lame, MD;  Location: [facility] ENDOSCOPY;  Service: Endoscopy;  Laterality: N/A;  . CYST REMOVAL LEG     and on shoulder   . LUNG BIOPSY  05 15 2013   has lung "spots"  . PERIPHERAL VASCULAR CATHETERIZATION Left 06/01/2016   Procedure: Lower Extremity Angiography;  Surgeon: Algernon Huxley, MD;  Location: [place] [facility];  Service: Cardiovascular;  Laterality: Left;  . PERIPHERAL VASCULAR CATHETERIZATION N/A 06/01/2016   Procedure: Abdominal Aortogram w/Lower Extremity;  Surgeon: Algernon Huxley, MD;  Location: [place] [facility];  Service: Cardiovascular;  Laterality: N/A;  . PERIPHERAL VASCULAR CATHETERIZATION  06/01/2016   Procedure: Lower Extremity Intervention;  Surgeon: Algernon Huxley, MD;  Location: [place] [facility];  Service: Cardiovascular;;  . PERIPHERAL VASCULAR  CATHETERIZATION Right 06/08/2016   Procedure: Lower Extremity Angiography;  Surgeon: Algernon Huxley, MD;  Location: [place] [facility];  Service: Cardiovascular;  Laterality: Right;  . PERIPHERAL VASCULAR CATHETERIZATION  06/08/2016   Procedure: Lower Extremity Intervention;  Surgeon: Algernon Huxley, MD;  Location: [place] [facility];  Service: Cardiovascular;;   Family History  Problem Relation Age of Onset  . Lung cancer Father   . Diabetes Mother   .  Hypercholesterolemia Mother   . Diabetes Sister   . Diabetes Maternal Grandmother   . Diabetes Paternal Grandmother   . Diabetes Sister   . Heart attack Brother   . Coronary artery disease Brother   . Vascular Disease Brother   . Hypertension Sister   . Heart attack Brother    No Known Allergies     Assessment & Plan:  Patient presents for six-month peripheral artery disease follow-up.  The patient presents today without any complaints to the left lower extremity.  The patient continues to experience some right lower extremity weakness.  Patient denies any rest pain or ulcer formation to the bilateral lower extremity.  The patient underwent a bilateral ABI which was notable for: RIGHT: 1.02, triphasic tibials.  Normal great toe waveforms. LEFT: 0.75, monophasic tibials with abnormal great toe waveforms.  She denies any bilateral lower examinee edema.  Patient denies any fever, nausea vomiting.  1. PAD (peripheral artery disease) (HCC) - Stable With a past medical history of peripheral artery disease with endovascular intervention to the left lower extremity. ABI today completed in the office is stable when compared to the previous ABI completed on September 17, 2017. Patient with normal arterial blood flow to the right lower extremity. Known and stable disease to the left lower extremity. The patient continues to experience left lower extremity weakness.  I do not feel that this is coming from a vascular source.  I encouraged the patient to  follow-up with her primary care physician for further work-up possibly rule out osteoarthritis or degenerative joint disease.  - VAS [name] ABI WITH/WO TBI; Future  2. Atherosclerosis of native artery of both lower extremities with intermittent claudication (HCC) - Stable As above  3. Hyperlipidemia, unspecified hyperlipidemia type - Stable Encouraged good control as its slows the progression of atherosclerotic disease  4. Type 2 diabetes mellitus with complication, unspecified whether long term insulin use (HCC) - Stable Encouraged good control as its slows the progression of atherosclerotic disease  Current Outpatient Medications on File Prior to Visit  Medication Sig Dispense Refill  . albuterol (PROVENTIL) (2.5 MG/3ML) 0.083% nebulizer solution Take 3 mLs (2.5 mg total) by nebulization every 6 (six) hours as needed for wheezing. 75 mL 3  . amLODipine (NORVASC) 10 MG tablet Take 1 tablet (10 mg total) by mouth every morning. 30 tablet 5  . aspirin EC 81 MG tablet Take 1 tablet (81 mg total) by mouth daily. 150 tablet 2  . buPROPion (WELLBUTRIN XL) 150 MG 24 hr tablet TAKE 1 TABLET BY MOUTH TWICE DAILY FOR SMOKING CESSATION 60 tablet 6  . clopidogrel (PLAVIX) 75 MG tablet TAKE ONE TABLET BY MOUTH ONCE DAILY 30 tablet 11  . gabapentin (NEURONTIN) 100 MG capsule Take 1 capsule (100 mg total) by mouth 2 (two) times daily. 60 capsule 5  . lisinopril-hydrochlorothiazide (PRINZIDE,ZESTORETIC) 10-12.5 MG per tablet Take 1 tablet by mouth every morning.    . meloxicam (MOBIC) 7.5 MG tablet Take 1 tablet (7.5 mg total) by mouth 2 (two) times daily. 60 tablet 3  . mometasone-formoterol (DULERA) 200-5 MCG/ACT AERO Inhale 1 puff into the lungs 2 (two) times daily. 1 Inhaler 5  . OXYGEN Inhale 2 L into the lungs at bedtime.    . potassium chloride (K-DUR) 10 MEQ tablet TAKE 1 TABLET BY MOUTH EVERY OTHER DAY FOR  LOW  POTASSIUM 15 tablet 2  . PROAIR HFA 108 (90 Base) MCG/ACT inhaler INHALE 2 PUFFS BY MOUTH  EVERY 4 TO 6  HOURS AS NEEDED FOR SHORTNESS OF BREATH 9 each 6  . rOPINIRole (REQUIP) 0.5 MG tablet Take 1 tablet (0.5 mg total) by mouth 2 (two) times daily. 60 tablet 2  . rosuvastatin (CRESTOR) 10 MG tablet Take 1 tablet (10 mg total) by mouth at bedtime. 30 tablet 5  . SitaGLIPtin-MetFORMIN HCl (JANUMET XR) 50-500 MG TB24 Take 1 tablet by mouth daily with breakfast.     . tiotropium (SPIRIVA HANDIHALER) 18 MCG inhalation capsule Place 1 capsule (18 mcg total) into inhaler and inhale daily. 30 capsule 5  . glucose blood (ACCU-CHEK AVIVA PLUS) test strip Use as instructed to check blood sugars three times daily.  E11.65 250 each 5   No current facility-administered medications on file prior to visit.    There are no Patient Instructions on file for this visit. No follow-ups on file.  KIMBERLY A STEGMAYER, PA-C

## 2018-03-21 ENCOUNTER — Inpatient Hospital Stay: Payer: BLUE CROSS/BLUE SHIELD | Admitting: Hematology and Oncology

## 2018-03-22 ENCOUNTER — Ambulatory Visit
Admission: RE | Admit: 2018-03-22 | Discharge: 2018-03-22 | Disposition: A | Discharge status: Home / Self Care | Payer: BLUE CROSS/BLUE SHIELD | Source: Ambulatory Visit | Attending: Radiation Oncology | Admitting: Radiation Oncology

## 2018-03-22 ENCOUNTER — Other Ambulatory Visit: Payer: Self-pay

## 2018-03-22 DIAGNOSIS — Z510 Encounter for antineoplastic radiation therapy: Secondary | ICD-10-CM | POA: Insufficient documentation

## 2018-03-22 DIAGNOSIS — C3431 Malignant neoplasm of lower lobe, right bronchus or lung: Secondary | ICD-10-CM | POA: Diagnosis present

## 2018-03-22 MED ORDER — MELOXICAM 7.5 MG PO TABS: 7.5000 mg | ORAL_TABLET | Freq: Two times a day (BID) | ORAL | 3 refills | Status: DC

## 2018-03-24 ENCOUNTER — Ambulatory Visit
Admission: RE | Admit: 2018-03-24 | Discharge: 2018-03-24 | Disposition: A | Discharge status: Home / Self Care | Payer: BLUE CROSS/BLUE SHIELD | Source: Ambulatory Visit | Attending: Radiation Oncology | Admitting: Radiation Oncology

## 2018-03-24 DIAGNOSIS — C3431 Malignant neoplasm of lower lobe, right bronchus or lung: Secondary | ICD-10-CM | POA: Diagnosis not present

## 2018-03-29 ENCOUNTER — Ambulatory Visit
Admission: RE | Admit: 2018-03-29 | Discharge: 2018-03-29 | Disposition: A | Discharge status: Home / Self Care | Payer: BLUE CROSS/BLUE SHIELD | Source: Ambulatory Visit | Attending: Radiation Oncology | Admitting: Radiation Oncology

## 2018-03-29 DIAGNOSIS — C3431 Malignant neoplasm of lower lobe, right bronchus or lung: Secondary | ICD-10-CM | POA: Diagnosis not present

## 2018-03-31 ENCOUNTER — Ambulatory Visit
Admission: RE | Admit: 2018-03-31 | Discharge: 2018-03-31 | Disposition: A | Discharge status: Home / Self Care | Payer: BLUE CROSS/BLUE SHIELD | Source: Ambulatory Visit | Attending: Radiation Oncology | Admitting: Radiation Oncology

## 2018-03-31 DIAGNOSIS — C3431 Malignant neoplasm of lower lobe, right bronchus or lung: Secondary | ICD-10-CM | POA: Diagnosis not present

## 2018-04-03 ENCOUNTER — Ambulatory Visit: Admission: RE | Admit: 2018-04-03 | Payer: BLUE CROSS/BLUE SHIELD | Source: Ambulatory Visit

## 2018-04-05 ENCOUNTER — Ambulatory Visit
Admission: RE | Admit: 2018-04-05 | Discharge: 2018-04-05 | Disposition: A | Discharge status: Home / Self Care | Payer: BLUE CROSS/BLUE SHIELD | Source: Ambulatory Visit | Attending: Radiation Oncology | Admitting: Radiation Oncology

## 2018-04-05 ENCOUNTER — Ambulatory Visit: Payer: BLUE CROSS/BLUE SHIELD

## 2018-04-05 DIAGNOSIS — C3431 Malignant neoplasm of lower lobe, right bronchus or lung: Secondary | ICD-10-CM | POA: Diagnosis not present

## 2018-04-11 ENCOUNTER — Ambulatory Visit: Payer: BLUE CROSS/BLUE SHIELD | Admitting: Hematology and Oncology

## 2018-04-14 ENCOUNTER — Other Ambulatory Visit: Payer: Self-pay | Admitting: Internal Medicine

## 2018-04-14 MED ORDER — TIOTROPIUM BROMIDE MONOHYDRATE 18 MCG IN CAPS: 18.0000 ug | ORAL_CAPSULE | Freq: Every day | RESPIRATORY_TRACT | 5 refills | Status: DC

## 2018-04-29 ENCOUNTER — Telehealth: Payer: Self-pay

## 2018-04-29 ENCOUNTER — Other Ambulatory Visit: Payer: Self-pay

## 2018-04-29 MED ORDER — ACCU-CHEK FASTCLIX LANCETS MISC: 1 refills | Status: DC

## 2018-04-29 NOTE — Telephone Encounter (Signed)
lmom to pt that we returning your called and esend lancets pres

## 2018-05-05 ENCOUNTER — Encounter: Payer: Self-pay | Admitting: Internal Medicine

## 2018-05-05 ENCOUNTER — Ambulatory Visit: Payer: BLUE CROSS/BLUE SHIELD | Admitting: Internal Medicine

## 2018-05-05 VITALS — BP 101/83 | HR 100 | Resp 16 | Ht 59.0 in | Wt 112.4 lb

## 2018-05-05 DIAGNOSIS — R0602 Shortness of breath: Secondary | ICD-10-CM | POA: Diagnosis not present

## 2018-05-05 DIAGNOSIS — Z9981 Dependence on supplemental oxygen: Secondary | ICD-10-CM

## 2018-05-05 DIAGNOSIS — J9611 Chronic respiratory failure with hypoxia: Secondary | ICD-10-CM | POA: Diagnosis not present

## 2018-05-05 DIAGNOSIS — J449 Chronic obstructive pulmonary disease, unspecified: Secondary | ICD-10-CM

## 2018-05-05 NOTE — Patient Instructions (Signed)

## 2018-05-05 NOTE — Progress Notes (Signed)
[facility] [employer], [place] [address]  Pulmonary Sleep Medicine   Office Visit Note  Patient Name: Angela Jensen DOB: Apr 25, 1956 MRN [id number]  Date of Service: 05/05/2018  Complaints/HPI: Pt here for follow up COPD, which is severe.  She is oxygen dependant at night.  She is currently using 2lpm via [place].  She continues to utilize and need oxygen overnight, due to SOB, and hypoxia.  She denies chest pain or palpitations.  She is using her inhalers as directed daily.  She uses a nebulizer when she has wheezing.  She reports she used it this morning, but before that it has been a week or two since using it.  She denies other complaints at this time.  She has not been hospitalized recently.  Overall she is doing well. Arlyce Harman today shows FEV1/FVC 63% of predicted value.   ROS  General: (-) fever, (-) chills, (-) night sweats, (-) weakness Skin: (-) rashes, (-) itching,. Eyes: (-) visual changes, (-) redness, (-) itching. Nose and Sinuses: (-) nasal stuffiness or itchiness, (-) postnasal drip, (-) nosebleeds, (-) sinus trouble. Mouth and Throat: (-) sore throat, (-) hoarseness. Neck: (-) swollen glands, (-) enlarged thyroid, (-) neck pain. Respiratory: - cough, (-) bloody sputum, + shortness of breath, + wheezing. Cardiovascular: - ankle swelling, (-) chest pain. Lymphatic: (-) lymph node enlargement. Neurologic: (-) numbness, (-) tingling. Psychiatric: (-) anxiety, (-) depression   Current Medication: Outpatient Encounter Medications as of 05/05/2018  Medication Sig  . ACCU-CHEK FASTCLIX LANCETS MISC Use as directed twice a day diag E11.65  . albuterol (PROVENTIL) (2.5 MG/3ML) 0.083% nebulizer solution Take 3 mLs (2.5 mg total) by nebulization every 6 (six) hours as needed for wheezing.  Marland Kitchen amLODipine (NORVASC) 10 MG tablet Take 1 tablet (10 mg total) by mouth every morning.  Marland Kitchen aspirin EC 81 MG tablet Take 1 tablet (81 mg total) by mouth daily.  Marland Kitchen buPROPion  (WELLBUTRIN XL) 150 MG 24 hr tablet TAKE 1 TABLET BY MOUTH TWICE DAILY FOR SMOKING CESSATION  . clopidogrel (PLAVIX) 75 MG tablet TAKE ONE TABLET BY MOUTH ONCE DAILY  . gabapentin (NEURONTIN) 100 MG capsule Take 1 capsule (100 mg total) by mouth 2 (two) times daily.  Marland Kitchen glucose blood (ACCU-CHEK AVIVA PLUS) test strip Use as instructed to check blood sugars three times daily.  E11.65  . lisinopril-hydrochlorothiazide (PRINZIDE,ZESTORETIC) 10-12.5 MG per tablet Take 1 tablet by mouth every morning.  . meloxicam (MOBIC) 7.5 MG tablet Take 1 tablet (7.5 mg total) by mouth 2 (two) times daily.  . mometasone-formoterol (DULERA) 200-5 MCG/ACT AERO Inhale 1 puff into the lungs 2 (two) times daily.  . OXYGEN Inhale 2 L into the lungs at bedtime.  . potassium chloride (K-DUR) 10 MEQ tablet TAKE 1 TABLET BY MOUTH EVERY OTHER DAY FOR  LOW  POTASSIUM  . PROAIR HFA 108 (90 Base) MCG/ACT inhaler INHALE 2 PUFFS BY MOUTH EVERY 4 TO 6 HOURS AS NEEDED FOR SHORTNESS OF BREATH  . rOPINIRole (REQUIP) 0.5 MG tablet Take 1 tablet (0.5 mg total) by mouth 2 (two) times daily.  . rosuvastatin (CRESTOR) 10 MG tablet Take 1 tablet (10 mg total) by mouth at bedtime.  . SitaGLIPtin-MetFORMIN HCl (JANUMET XR) 50-500 MG TB24 Take 1 tablet by mouth daily with breakfast.   . tiotropium (SPIRIVA HANDIHALER) 18 MCG inhalation capsule Place 1 capsule (18 mcg total) into inhaler and inhale daily.   No facility-administered encounter medications on file as of 05/05/2018.  Surgical History: Past Surgical History:  Procedure Laterality Date  . CESAREAN SECTION     x3  . COLONOSCOPY WITH PROPOFOL N/A 06/25/2015   Procedure: COLONOSCOPY WITH PROPOFOL;  Surgeon: Lucilla Lame, MD;  Location: [facility] ENDOSCOPY;  Service: Endoscopy;  Laterality: N/A;  . CYST REMOVAL LEG     and on shoulder   . LUNG BIOPSY  05 15 2013   has lung "spots"  . PERIPHERAL VASCULAR CATHETERIZATION Left 06/01/2016   Procedure: Lower Extremity Angiography;   Surgeon: Algernon Huxley, MD;  Location: [place] [facility];  Service: Cardiovascular;  Laterality: Left;  . PERIPHERAL VASCULAR CATHETERIZATION N/A 06/01/2016   Procedure: Abdominal Aortogram w/Lower Extremity;  Surgeon: Algernon Huxley, MD;  Location: [place] [facility];  Service: Cardiovascular;  Laterality: N/A;  . PERIPHERAL VASCULAR CATHETERIZATION  06/01/2016   Procedure: Lower Extremity Intervention;  Surgeon: Algernon Huxley, MD;  Location: [place] [facility];  Service: Cardiovascular;;  . PERIPHERAL VASCULAR CATHETERIZATION Right 06/08/2016   Procedure: Lower Extremity Angiography;  Surgeon: Algernon Huxley, MD;  Location: [place] [facility];  Service: Cardiovascular;  Laterality: Right;  . PERIPHERAL VASCULAR CATHETERIZATION  06/08/2016   Procedure: Lower Extremity Intervention;  Surgeon: Algernon Huxley, MD;  Location: [place] [facility];  Service: Cardiovascular;;    Medical History: Past Medical History:  Diagnosis Date  . Arthritis   . Asthma   . Atherosclerosis of native arteries of extremity with intermittent claudication ([place]) 05/26/2016  . Benign neoplasm of sigmoid colon    pt denies   . Cancer [facility]) 2012   Right Lung CA  . COPD (chronic obstructive pulmonary disease) ([place])   . Depression   . Diabetes mellitus without complication [facility])    Patient takes Janumet  . Essential hypertension 05/26/2016  . Hypercholesteremia   . Hypertension   . 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 PAD (peripheral artery disease) ([place]) 06/22/2016  . Peripheral vascular disease ([place])   . Shortness of breath dyspnea    with exertion     Family History: Family History  Problem Relation Age of Onset  . Lung cancer Father   . Diabetes Mother   . Hypercholesterolemia Mother   . Diabetes Sister   . Diabetes Maternal Grandmother   . Diabetes Paternal Grandmother   . Diabetes Sister   . Heart attack Brother   . Coronary artery disease Brother   . Vascular Disease Brother   . Hypertension  Sister   . Heart attack Brother     Social History: Social History   Socioeconomic History  . Marital status: Widowed    Spouse name: Not on file  . Number of children: Not on file  . Years of education: Not on file  . Highest education level: Not on file  Occupational History  . Not on file  Social Needs  . Financial resource strain: Not on file  . Food insecurity:    Worry: Not on file    Inability: Not on file  . Transportation needs:    Medical: Not on file    Non-medical: Not on file  Tobacco Use  . Smoking status: Former Smoker    Packs/day: 1.00    Years: 37.00    Pack years: 37.00    Types: Cigarettes    Last attempt to quit: 02/06/2010    Years since quitting: 8.2  . Smokeless tobacco: Former Systems developer    Types: Snuff  Substance and Sexual  Activity  . Alcohol use: Yes    Alcohol/week: 5.0 standard drinks    Types: 5 Cans of beer per week    Comment: /h x of alcohol abuse -stopped 2012- now drinks 5 beer per w  . Drug use: Yes    Types: Marijuana, "Crack" cocaine, Cocaine    Comment: hx of cocaine use- last use 2015; last use sat marijuana6/22/19,   . Sexual activity: Yes  Lifestyle  . Physical activity:    Days per week: Not on file    Minutes per session: Not on file  . Stress: Not on file  Relationships  . Social connections:    Talks on phone: Not on file    Gets together: Not on file    Attends religious service: Not on file    Active member of club or organization: Not on file    Attends meetings of clubs or organizations: Not on file    Relationship status: Not on file  . Intimate partner violence:    Fear of current or ex partner: Not on file    Emotionally abused: Not on file    Physically abused: Not on file    Forced sexual activity: Not on file  Other Topics Concern  . Not on file  Social History Narrative  . Not on file    Vital Signs: Blood pressure 101/83, pulse 100, resp. rate 16, height '4\' 11"'  (1.499 m), weight 112 lb 6.4 oz (51  kg), SpO2 93 %.  Examination: General Appearance: The patient is well-developed, well-nourished, and in no distress. Skin: Gross inspection of skin unremarkable. Head: normocephalic, no gross deformities. Eyes: no gross deformities noted. ENT: ears appear grossly normal no exudates. Neck: Supple. No thyromegaly. No LAD. Respiratory: Faint expiratory wheeze noted. Cardiovascular: Normal S1 and S2 without murmur or rub. Extremities: No cyanosis. pulses are equal. Neurologic: Alert and oriented. No involuntary movements.  LABS: Recent Results (from the past 2160 hour(s))  APTT     Status: None   Collection Time: 02/08/18 10:57 AM  Result Value Ref Range   aPTT 29 24 - 36 seconds    Comment: Performed at [facility], [address] [place], [place] [address]  Protime-INR     Status: None   Collection Time: 02/08/18 10:57 AM  Result Value Ref Range   Prothrombin Time 13.1 11.4 - 15.2 seconds   INR 1.00     Comment: Performed at [facility], [address] [place],  [address]  CBC with Differential/Platelet     Status: Abnormal   Collection Time: 02/08/18 10:57 AM  Result Value Ref Range   WBC 3.6 3.6 - 11.0 K/uL   RBC 4.44 3.80 - 5.20 MIL/uL   Hemoglobin 13.2 12.0 - 16.0 g/dL   HCT 39.4 35.0 - 47.0 %   MCV 88.7 80.0 - 100.0 fL   MCH 29.6 26.0 - 34.0 pg   MCHC 33.4 32.0 - 36.0 g/dL   RDW 13.9 11.5 - 14.5 %   Platelets 267 150 - 440 K/uL   Neutrophils Relative % 65 %   Neutro Abs 2.4 1.4 - 6.5 K/uL   Lymphocytes Relative 24 %   Lymphs Abs 0.9 (L) 1.0 - 3.6 K/uL   Monocytes Relative 8 %   Monocytes Absolute 0.3 0.2 - 0.9 K/uL   Eosinophils Relative 2 %   Eosinophils Absolute 0.1 0 - 0.7 K/uL   Basophils Relative 1 %   Basophils Absolute 0.0 0 - 0.1 K/uL  Comment: Performed at [facility], [address] [place], [place] [address]  Glucose, capillary     Status: Abnormal   Collection Time: 02/09/18  9:17 AM  Result Value Ref Range    Glucose-Capillary 100 (H) 70 - 99 mg/dL  Surgical pathology     Status: None   Collection Time: 02/09/18 10:58 AM  Result Value Ref Range   SURGICAL PATHOLOGY      Surgical Pathology CASE: [id number] PATIENT: [facility] Surgical Pathology Report     SPECIMEN SUBMITTED: A. Lung, RLL; CT biopsy  CLINICAL HISTORY: Enlarging subsolid nodule right lower lobe. Former smoker. 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  PRE-OPERATIVE DIAGNOSIS: Adenocarcinoma  POST-OPERATIVE DIAGNOSIS: None provided.     DIAGNOSIS: A. LUNG, RIGHT LOWER LOBE; CT-GUIDED CORE BIOPSY: - ADENOCARCINOMA OF LUNG ORIGIN, LEPIDIC AND PAPILLARY PATTERNS.  Comment: There are areas of clear cell change, a finding also noted in the pathology report from the previous RUL lobe biopsies from 2013 [facility] Pathology Accession: [id number]). The neoplastic cells are positive for TTF1 and napsin A, and negative for PAX8, a profile which supports lung origin.  There is adequate tissue for ancillary testing.  The diagnosis was called to Honor Loh on 02/11/2018 at 1:15 PM. Read-back was performed.  IHC slid es were prepared by [facility] for [employer] and Pathology, [place], [place]. All controls stained appropriately.  This test was developed and its performance characteristics determined by [facility]. It has not been cleared or approved by the [name] Food and Drug Administration. The FDA does not require this test to go through premarket FDA review. This test is used for clinical purposes. It should not be regarded as investigational or for research. This laboratory is certified under the Clinical Laboratory Improvement Amendments (CLIA) as qualified to perform high complexity clinical laboratory testing.   GROSS DESCRIPTION: A. Labeled: Right lung nodule biopsy Received: In formalin Tissue fragment(s): Multiple Size: Aggregate, 1.2 x 0.1 x 0.1  cm Description: Pink to gray cores and fragments Entirely submitted in two cassettes.   Final Diagnosis performed by Bryan Lemma, MD.   Electronically signed 02/11/2018 3:25:08PM The electronic signature indicates that the named  Attending Pathologist has evaluated the specimen  Technical component performed at [facility], [address] [place], [place] [address] Lab: [id number] Dir: Rush Farmer, MD, MMM  Professional component performed at [facility], [facility], [place], [place], [place] [address] Lab: [phone] Dir: Dellia Nims. Rubinas, MD   CBC     Status: None   Collection Time: 02/11/18 10:20 AM  Result Value Ref Range   WBC 3.9 3.6 - 11.0 K/uL   RBC 4.09 3.80 - 5.20 MIL/uL   Hemoglobin 12.1 12.0 - 16.0 g/dL   HCT 36.5 35.0 - 47.0 %   MCV 89.1 80.0 - 100.0 fL   MCH 29.6 26.0 - 34.0 pg   MCHC 33.2 32.0 - 36.0 g/dL   RDW 14.1 11.5 - 14.5 %   Platelets 244 150 - 440 K/uL    Comment: Performed at [facility], [address], [place], [place] [address]  Basic metabolic panel     Status: Abnormal   Collection Time: 02/11/18 10:20 AM  Result Value Ref Range   Sodium 138 135 - 145 mmol/L   Potassium 4.2 3.5 - 5.1 mmol/L   Chloride 103 98 - 111 mmol/L    Comment: Please note change in reference range.   CO2 27 22 -  32 mmol/L   Glucose, Bld 100 (H) 70 - 99 mg/dL    Comment: Please note change in reference range.   BUN 14 8 - 23 mg/dL    Comment: Please note change in reference range.   Creatinine, Ser 0.65 0.44 - 1.00 mg/dL   Calcium 9.4 8.9 - 10.3 mg/dL   GFR calc non Af Amer >60 >60 mL/min   GFR calc Af Amer >60 >60 mL/min    Comment: (NOTE) The eGFR has been calculated using the CKD EPI equation. This calculation has not been validated in all clinical situations. eGFR's persistently <60 mL/min signify possible Chronic Kidney Disease.    Anion gap 8 5 - 15    Comment: Performed at [facility], [address]  [place], [place] [address]    Radiology: No results found.  No results found.  No results found.    Assessment and Plan: Patient Active Problem List   Diagnosis Date Noted  . Cancer of lower lobe of right lung ([place]) 01/21/2018  . Goals of care, counseling/discussion 10/12/2017  . Personal history of tobacco use, presenting hazards to health 02/03/2017  . PAD (peripheral artery disease) ([place]) 06/22/2016  . Essential hypertension 05/26/2016  . Diabetes ([place]) 05/26/2016  . Hyperlipidemia 05/26/2016  . Atherosclerosis of native arteries of extremity with intermittent claudication ([place]) 05/26/2016  . Uterine leiomyoma 04/30/2016  . Elevated CEA 01/31/2016  . Special screening for malignant neoplasms, colon   . Benign neoplasm of sigmoid colon   . Primary lung cancer ([place])   . Malignant neoplasm of upper lobe of right lung ([place])     1. Chronic obstructive pulmonary disease, unspecified COPD type ([place]) Encouraged patient to continue her inhalers as prescribed. Continue current supportive care, will follow up in 6 months.   2. Chronic respiratory failure with hypoxia ([place]) Patient continues to require oxygen at night. Continue as ordered.  3. Oxygen dependent Continue current flow rate of 2lpm.  Will follow up in 6 months.   4. Shortness of breath - Spirometry with Graph  General Counseling: I have discussed the findings of the evaluation and examination with [facility].  I have also discussed any further diagnostic evaluation thatmay be needed or ordered today. Meila verbalizes understanding of the findings of todays visit. We also reviewed her medications today and discussed drug interactions and side effects including but not limited excessive drowsiness and altered mental states. We also discussed that there is always a risk not just to her but also people around her. she has been encouraged to call the office with any questions or concerns that should arise related to todays  visit.    Time spent: 25 This patient was seen by Orson Gear AGNP-C in Collaboration with Dr. Devona Konig as a part of collaborative care agreement.   I have personally obtained a history, examined the patient, evaluated laboratory and imaging results, formulated the assessment and plan and placed orders.    Allyne Gee, MD [facility] Pulmonary and Critical Care Sleep medicine

## 2018-05-06 ENCOUNTER — Inpatient Hospital Stay: Payer: BLUE CROSS/BLUE SHIELD | Attending: Hematology and Oncology | Admitting: Hematology and Oncology

## 2018-05-06 ENCOUNTER — Other Ambulatory Visit: Payer: Self-pay

## 2018-05-06 ENCOUNTER — Encounter: Payer: Self-pay | Admitting: Hematology and Oncology

## 2018-05-06 VITALS — BP 126/87 | HR 101 | Temp 98.1°F | Wt 112.0 lb

## 2018-05-06 DIAGNOSIS — C3431 Malignant neoplasm of lower lobe, right bronchus or lung: Secondary | ICD-10-CM | POA: Insufficient documentation

## 2018-05-06 DIAGNOSIS — I10 Essential (primary) hypertension: Secondary | ICD-10-CM | POA: Diagnosis not present

## 2018-05-06 DIAGNOSIS — I70219 Atherosclerosis of native arteries of extremities with intermittent claudication, unspecified extremity: Secondary | ICD-10-CM | POA: Insufficient documentation

## 2018-05-06 DIAGNOSIS — R911 Solitary pulmonary nodule: Secondary | ICD-10-CM

## 2018-05-06 DIAGNOSIS — E7800 Pure hypercholesterolemia, unspecified: Secondary | ICD-10-CM | POA: Insufficient documentation

## 2018-05-06 DIAGNOSIS — Z7982 Long term (current) use of aspirin: Secondary | ICD-10-CM

## 2018-05-06 DIAGNOSIS — Z79899 Other long term (current) drug therapy: Secondary | ICD-10-CM | POA: Insufficient documentation

## 2018-05-06 DIAGNOSIS — Z86010 Personal history of colonic polyps: Secondary | ICD-10-CM | POA: Insufficient documentation

## 2018-05-06 DIAGNOSIS — J449 Chronic obstructive pulmonary disease, unspecified: Secondary | ICD-10-CM | POA: Diagnosis not present

## 2018-05-06 DIAGNOSIS — E119 Type 2 diabetes mellitus without complications: Secondary | ICD-10-CM | POA: Insufficient documentation

## 2018-05-06 DIAGNOSIS — Z9981 Dependence on supplemental oxygen: Secondary | ICD-10-CM

## 2018-05-06 DIAGNOSIS — Z791 Long term (current) use of non-steroidal anti-inflammatories (NSAID): Secondary | ICD-10-CM

## 2018-05-06 DIAGNOSIS — F17220 Nicotine dependence, chewing tobacco, uncomplicated: Secondary | ICD-10-CM | POA: Insufficient documentation

## 2018-05-06 DIAGNOSIS — Z923 Personal history of irradiation: Secondary | ICD-10-CM | POA: Insufficient documentation

## 2018-05-06 DIAGNOSIS — M1990 Unspecified osteoarthritis, unspecified site: Secondary | ICD-10-CM | POA: Diagnosis not present

## 2018-05-06 DIAGNOSIS — Z87891 Personal history of nicotine dependence: Secondary | ICD-10-CM | POA: Diagnosis not present

## 2018-05-06 MED ORDER — ACCU-CHEK FASTCLIX LANCETS MISC
1 refills | Status: DC
Start: 2018-05-06 — End: 2019-03-15

## 2018-05-06 NOTE — Progress Notes (Signed)
[facility] day:  05/06/2018   Chief Complaint: Angela Jensen is a 62 y.o. female with a history of stage I lung cancer who is seen for assessment after radiation to the right lower lobe.  HPI: The patient was last seen in the medical oncology clinic on 02/14/2018.  At that time, she felt "fine".  Patient had hemoptysis following her recent lung biopsy. She had stable exertional dyspnea.  Exam was stable.   RLL biopsy on 02/09/2018 revealed adenocarcinoma of lung origin, lepidic and papillary patterns.    She was seen by Dr. Baruch Gouty on 03/02/2018.  Plan was for 6000 cGy in 5 fractions to her RLL.  She received treatment from 03/22/2018 - 04/05/2018.  During the interim, she has felt good.  She only notes shortness of breath with exertion.  She still has some pain in her right shoulder.  She denies any other joint or bone pain.  She has an appointment with Dr. Baruch Gouty on 07/13/2018.    Past Medical History:  Diagnosis Date  . Arthritis   . Asthma   . Atherosclerosis of native arteries of extremity with intermittent claudication ([place]) 05/26/2016  . Benign neoplasm of sigmoid colon    pt denies   . Cancer [facility]) 2012   Right Lung CA  . COPD (chronic obstructive pulmonary disease) ([place])   . Depression   . Diabetes mellitus without complication [facility])    Patient takes Janumet  . Essential hypertension 05/26/2016  . Hypercholesteremia   . Hypertension   . 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 PAD (peripheral artery disease) ([place]) 06/22/2016  . Peripheral vascular disease ([place])   . Shortness of breath dyspnea    with exertion     Past Surgical History:  Procedure Laterality Date  . CESAREAN SECTION     x3  . COLONOSCOPY WITH PROPOFOL N/A 06/25/2015   Procedure: COLONOSCOPY WITH PROPOFOL;  Surgeon: Lucilla Lame, MD;  Location: [facility] ENDOSCOPY;  Service: Endoscopy;  Laterality: N/A;  . CYST REMOVAL LEG     and on shoulder   . LUNG BIOPSY  05  15 2013   has lung "spots"  . PERIPHERAL VASCULAR CATHETERIZATION Left 06/01/2016   Procedure: Lower Extremity Angiography;  Surgeon: Algernon Huxley, MD;  Location: [place] CV LAB;  Service: Cardiovascular;  Laterality: Left;  . PERIPHERAL VASCULAR CATHETERIZATION N/A 06/01/2016   Procedure: Abdominal Aortogram w/Lower Extremity;  Surgeon: Algernon Huxley, MD;  Location: [place] [facility];  Service: Cardiovascular;  Laterality: N/A;  . PERIPHERAL VASCULAR CATHETERIZATION  06/01/2016   Procedure: Lower Extremity Intervention;  Surgeon: Algernon Huxley, MD;  Location: [place] [facility];  Service: Cardiovascular;;  . PERIPHERAL VASCULAR CATHETERIZATION Right 06/08/2016   Procedure: Lower Extremity Angiography;  Surgeon: Algernon Huxley, MD;  Location: [place] [facility];  Service: Cardiovascular;  Laterality: Right;  . PERIPHERAL VASCULAR CATHETERIZATION  06/08/2016   Procedure: Lower Extremity Intervention;  Surgeon: Algernon Huxley, MD;  Location: [place] [facility] LAB;  Service: Cardiovascular;;    Family History  Problem Relation Age of Onset  . Lung cancer Father   . Diabetes Mother   . Hypercholesterolemia Mother   . Diabetes Sister   . Diabetes Maternal Grandmother   . Diabetes Paternal Grandmother   . Diabetes Sister   . Heart attack Brother   . Coronary artery disease Brother   . Vascular Disease Brother   . Hypertension  Sister   . Heart attack Brother     Social History:  reports that she quit smoking about 8 years ago. Her smoking use included cigarettes. She has a 37.00 pack-year smoking history. She has quit using smokeless tobacco.  Her smokeless tobacco use included snuff. She reports that she drinks about 5.0 standard drinks of alcohol per week. She reports that she has current or past drug history. Drugs: Marijuana, "Crack" cocaine, and Cocaine.  She has not smoked for 1 month.  She started smoking at 17 until she was "64 something".   Her boyfriend is [name]:  [phone].  She lives in [place].  The patient is alone today.  Allergies: No Known Allergies  Current Medications: Current Outpatient Medications  Medication Sig Dispense Refill  . ACCU-CHEK FASTCLIX LANCETS MISC Use as directed twice a day diag E11.65 100 each 1  . albuterol (PROVENTIL) (2.5 MG/3ML) 0.083% nebulizer solution Take 3 mLs (2.5 mg total) by nebulization every 6 (six) hours as needed for wheezing. 75 mL 3  . amLODipine (NORVASC) 10 MG tablet Take 1 tablet (10 mg total) by mouth every morning. 30 tablet 5  . aspirin EC 81 MG tablet Take 1 tablet (81 mg total) by mouth daily. 150 tablet 2  . buPROPion (WELLBUTRIN XL) 150 MG 24 hr tablet TAKE 1 TABLET BY MOUTH TWICE DAILY FOR SMOKING CESSATION 60 tablet 6  . clopidogrel (PLAVIX) 75 MG tablet TAKE ONE TABLET BY MOUTH ONCE DAILY 30 tablet 11  . gabapentin (NEURONTIN) 100 MG capsule Take 1 capsule (100 mg total) by mouth 2 (two) times daily. 60 capsule 5  . glucose blood (ACCU-CHEK AVIVA PLUS) test strip Use as instructed to check blood sugars three times daily.  E11.65 250 each 5  . lisinopril-hydrochlorothiazide (PRINZIDE,ZESTORETIC) 10-12.5 MG per tablet Take 1 tablet by mouth every morning.    . meloxicam (MOBIC) 7.5 MG tablet Take 1 tablet (7.5 mg total) by mouth 2 (two) times daily. 60 tablet 3  . mometasone-formoterol (DULERA) 200-5 MCG/ACT AERO Inhale 1 puff into the lungs 2 (two) times daily. 1 Inhaler 5  . OXYGEN Inhale 2 L into the lungs at bedtime.    . potassium chloride (K-DUR) 10 MEQ tablet TAKE 1 TABLET BY MOUTH EVERY OTHER DAY FOR  LOW  POTASSIUM 15 tablet 2  . PROAIR HFA 108 (90 Base) MCG/ACT inhaler INHALE 2 PUFFS BY MOUTH EVERY 4 TO 6 HOURS AS NEEDED FOR SHORTNESS OF BREATH 9 each 6  . rOPINIRole (REQUIP) 0.5 MG tablet Take 1 tablet (0.5 mg total) by mouth 2 (two) times daily. 60 tablet 2  . rosuvastatin (CRESTOR) 10 MG tablet Take 1 tablet (10 mg total) by mouth at bedtime. 30 tablet 5  .  SitaGLIPtin-MetFORMIN HCl (JANUMET XR) 50-500 MG TB24 Take 1 tablet by mouth daily with breakfast.     . tiotropium (SPIRIVA HANDIHALER) 18 MCG inhalation capsule Place 1 capsule (18 mcg total) into inhaler and inhale daily. 30 capsule 5   No current facility-administered medications for this visit.     Review of Systems  Constitutional: Negative.  Negative for chills, fever, malaise/fatigue and weight loss (up 5 pounds).       Feels "good".  HENT: Negative.  Negative for congestion, ear discharge, ear pain, nosebleeds, sinus pain, sore throat and tinnitus.   Eyes: Negative.  Negative for double vision, photophobia, pain and discharge.  Respiratory: Positive for shortness of breath (exertional). Negative for cough, hemoptysis and sputum production.   Cardiovascular:  Negative.  Negative for chest pain, palpitations, orthopnea, leg swelling and PND.  Gastrointestinal: Negative.  Negative for abdominal pain, blood in stool, constipation, diarrhea, melena, nausea and vomiting.  Genitourinary: Negative.  Negative for dysuria, frequency, hematuria and urgency.  Musculoskeletal: Positive for joint pain (shoulder). Negative for back pain, falls and myalgias.  Skin: Negative.  Negative for itching and rash.  Neurological: Negative.  Negative for dizziness, tingling, sensory change, speech change, focal weakness, weakness and headaches.  Endo/Heme/Allergies: Negative.  Does not bruise/bleed easily.       Diabetes  Psychiatric/Behavioral: Negative for depression. The patient is not nervous/anxious and does not have insomnia.   All other systems reviewed and are negative.  Performance status (ECOG): 1  Physical Exam: Blood pressure 126/87, pulse (!) 101, temperature 98.1 F (36.7 C), temperature source Tympanic, weight 112 lb (50.8 kg). GENERAL: Thin woman sitting comfortably in the exam room in no acute distress. MENTAL STATUS:  Alert and oriented to person, place and time. HEAD:  Shoulder length  hair.  Normocephalic, atraumatic, face symmetric, no Cushingoid features. EYES:  Brown eyes.  Pupils equal round and reactive to light and accomodation.  No conjunctivitis or scleral icterus. ENT:  Oropharynx clear without lesion.  Tongue normal.  Loose dentures.  Mucous membranes moist.  RESPIRATORY:  Clear to auscultation without rales, wheezes or rhonchi. CARDIOVASCULAR:  Regular rate and rhythm without murmur, rub or gallop. ABDOMEN:  Soft, non-tender, with active bowel sounds, and no hepatosplenomegaly.  No masses. SKIN:  No rashes, ulcers or lesions. EXTREMITIES: No edema, no skin discoloration or tenderness.  No palpable cords. LYMPH NODES: No palpable cervical, supraclavicular, axillary or inguinal adenopathy  NEUROLOGICAL: Unremarkable. PSYCH:  Appropriate.    No visits with results within 3 Day(s) from this visit.  Latest known visit with results is:  Admission on 02/11/2018, Discharged on 02/11/2018  Component Date Value Ref Range Status  . WBC 02/11/2018 3.9  3.6 - 11.0 K/uL Final  . RBC 02/11/2018 4.09  3.80 - 5.20 MIL/uL Final  . Hemoglobin 02/11/2018 12.1  12.0 - 16.0 g/dL Final  . HCT 02/11/2018 36.5  35.0 - 47.0 % Final  . MCV 02/11/2018 89.1  80.0 - 100.0 fL Final  . MCH 02/11/2018 29.6  26.0 - 34.0 pg Final  . MCHC 02/11/2018 33.2  32.0 - 36.0 g/dL Final  . RDW 02/11/2018 14.1  11.5 - 14.5 % Final  . Platelets 02/11/2018 244  150 - 440 K/uL Final   Performed at [facility], [address], [place],  [address]  . Sodium 02/11/2018 138  135 - 145 mmol/L Final  . Potassium 02/11/2018 4.2  3.5 - 5.1 mmol/L Final  . Chloride 02/11/2018 103  98 - 111 mmol/L Final   Please note change in reference range.  . CO2 02/11/2018 27  22 - 32 mmol/L Final  . Glucose, Bld 02/11/2018 100* 70 - 99 mg/dL Final   Please note change in reference range.  . BUN 02/11/2018 14  8 - 23 mg/dL Final   Please note change in reference range.  . Creatinine, Ser 02/11/2018  0.65  0.44 - 1.00 mg/dL Final  . Calcium 02/11/2018 9.4  8.9 - 10.3 mg/dL Final  . GFR calc non Af Amer 02/11/2018 >60  >60 mL/min Final  . GFR calc Af Amer 02/11/2018 >60  >60 mL/min Final   Comment: (NOTE) The eGFR has been calculated using the CKD EPI equation. This calculation has not been validated in all clinical  situations. eGFR's persistently <60 mL/min signify possible Chronic Kidney Disease.   Georgiann Hahn gap 02/11/2018 8  5 - 15 Final   Performed at [facility], [address] [place], [place] [address]    Assessment:  MALANEY MCBEAN is a 62 y.o. 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   She was not a good surgery candidate secondary to her lung function (FEV1 28%).  Chest CT on 07/31/2011 revealed a 1.1 x 0.6 cm RUL nodule and a 9 mm inferior aspect RUL nodule and a 4 mm nodule in the anterior aspect of the inferior aspect of the RUL.  PET scan on 08/03/2011 revealed a 10-mm spiculated right apical pulmonary nodule (SUV 2.5) suspcious for lung cancer. Left ovary was prominent (3.9 x 2.4 cm).   CA-125 was 9.3 (normal).  CT guided biopsy of the superior and inferior RUL nodules on 12/30/2011 revealed well differentiated adenocarcinoma of lung primary.  EGFR testing revealed no mutation.  She received IMRT 6000 cGy to both nodules from 01/26/2012 - 03/10/2012.  PET scan on 01/30/2015 revealed a hypermetabolic and enlarging RUL pulmonary nodules concerning for lung cancer recurrence.  There was a small focus of peripheral nodular consolidation in the superior segment of the right lower lobe with differential including inflammation/infection versus neoplasm.  There was no evidence of mediastinal nodal metastasis.    PET scan on 03/27/2016 revealed no hypermetabolic activity to suggest recurrent malignancy. There was a soft tissue density in the left pelvis near the inguinal ring that was not hypermetabolic, but prominent and  probably separate from the left ovary. This could be a wandering fibroid.  Although the overall appearance had not changed from 2012 and was not hypermetabolic, pelvic sonography or pelvic MRI for further characterization of the uterus and ovaries can be considered.  In 04/2015, there was an attempt at radiofrequency ablation of 1 enlarging RUL nodule.  At the time of the procedure, the nodule had shrunk to 5 mm from 9 mm.    Chest CT on 08/01/2015 revealed complete resolution of the prior nodule in the right lung apex.   Chest CT on 01/31/2016 revealed no active cardiopulmonary abnormalities.  There was stable appearance of right apical and right middle lobe scar like densities.   Low dose chest CT on 02/02/2017 revealed new ground-glass nodule in the right lower lobe. Lung-RADS 3 (probably benign findings).   Low dose chest CT  on 07/29/2017 demonstrated enlargement of the previously observed sub-solid pulmonary lesion. RIGHT upper lobe lesion measured 1.4 x 2.1 x 1.6 cm (previously 1.5 x 1.1 x 1.7 cm). There was also areas of focal architectural distortion in the RIGHT upper and middle lobes that were felt to be most compatible with areas of chronic post infectious or inflammatory scarring. Aortic value calcifications were noted. Lung-RADS 4BS (suspicious).  Chest CT on 01/19/2018 revealed a subsolid 2.1 cm peripheral right lower lobe pulmonary nodule.  Although not appreciably changed in size, subjectively this nodule had increased in density, and remains suspicious for primary bronchogenic adenocarcinoma.  There was no thoracic adenopathy.  CT guided RLL biopsy on 02/09/2018 revealed adenocarcinoma of lung origin, lepidic and papillary patterns.    She received 6000 cGy in 5 fractions to her RL from 03/22/2018 - 04/05/2018.  She has an elevated CEA of unclear etiology.  CEA was 8.1 on 01/31/2016, 9.2 on 03/03/2016, and 7.9 on 10/12/2017.  Colonoscopy on 06/25/2015 revealed a 4  mm polyp in the  sigmoid colon.  Pathology revealed a tubular adenoma without evidence of dysplasia or malignancy.  Pelvic MRI on 04/30/2016 revealed a 3.4 x 2.5 cm ovoid mass in the LEFT adnexa with signal characteristics identical to the intramural leiomyomas and consistent with an exophytic leiomyoma.  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  Endometrium was normal with a normal junctional zone.  Ovaries were not identified.  Symptomatically, she feels "good".  She has chronic shortness of breath with exertion.  Weight is up 5 pounds.  Exam is stable.  Plan: 1. Stage I adenocarcinoma RUL:  s/p IMRT (completed 03/10/2012). 2. Right lower lung pulmonary nodule:  s/p SBRT (completed 04/05/2018).  Discuss plans for chest CT 3 months after completion of radiation. 3.  Schedule chest CT on 07/06/2018. 4.  RTC after chest CT for MD assessment, labs (CBC with diff, CMP), and review of imaging.   Lequita Asal, MD  05/06/2018, 1:27 PM

## 2018-05-06 NOTE — Progress Notes (Signed)
Patient offers no complaints today.  Patent states she finished her radiation on August 20th.

## 2018-05-12 ENCOUNTER — Ambulatory Visit
Admission: RE | Admit: 2018-05-12 | Discharge: 2018-05-12 | Disposition: A | Discharge status: Home / Self Care | Payer: BLUE CROSS/BLUE SHIELD | Source: Ambulatory Visit | Attending: Radiation Oncology | Admitting: Radiation Oncology

## 2018-05-12 ENCOUNTER — Other Ambulatory Visit: Payer: Self-pay | Admitting: *Deleted

## 2018-05-12 ENCOUNTER — Other Ambulatory Visit: Payer: Self-pay

## 2018-05-12 ENCOUNTER — Encounter: Payer: Self-pay | Admitting: Radiation Oncology

## 2018-05-12 VITALS — BP 108/76 | HR 90 | Temp 97.4°F | Resp 12 | Ht 59.0 in | Wt 108.8 lb

## 2018-05-12 DIAGNOSIS — C3431 Malignant neoplasm of lower lobe, right bronchus or lung: Secondary | ICD-10-CM

## 2018-05-12 DIAGNOSIS — Z85118 Personal history of other malignant neoplasm of bronchus and lung: Secondary | ICD-10-CM | POA: Diagnosis not present

## 2018-05-12 MED ORDER — AZITHROMYCIN 250 MG PO TABS: ORAL_TABLET | ORAL | 0 refills | Status: DC

## 2018-05-12 NOTE — Progress Notes (Signed)
Radiation Oncology Follow up Note  Name: Angela Jensen   Date:   05/12/2018 MRN:  [id number] DOB: [birth year]    This 62 y.o. female presents to the clinic today for one-month follow-up status post SB RT to her right upper lobe for salvage treatment inpatient previous a treated 6 years prior for non-small cell lung cancer.  REFERRING PROVIDER: Ronnell Freshwater, NP  HPI: patient is a 62 year old female previously treated 6 years prior to her right upper lobe for a non-small cell lung cancer. Patient also had a right lower lobe lesion which was increasing in size underwent biopsy which was positive for adenocarcinoma. She is now 1 month out of SB RT is doing well and is without complaint specifically denies productive cough hemoptysis or chest tightness she has a slight intermittent dry cough. P when take is good.  COMPLICATIONS OF TREATMENT: none  FOLLOW UP COMPLIANCE: keeps appointments   PHYSICAL EXAM:  BP 108/76 (BP Location: Right Arm, Patient Position: Sitting)   Pulse 90   Temp (!) 97.4 F (36.3 C) (Tympanic)   Resp 12   Ht 4\' 11"  (1.499 m)   Wt 108 lb 12.8 oz (49.3 kg)   BMI 21.97 kg/m  Well-developed well-nourished patient in NAD. HEENT reveals PERLA, EOMI, discs not visualized.  Oral cavity is clear. No oral mucosal lesions are identified. Neck is clear without evidence of cervical or supraclavicular adenopathy. Lungs are clear to A&P. Cardiac examination is essentially unremarkable with regular rate and rhythm without murmur rub or thrill. Abdomen is benign with no organomegaly or masses noted. Motor sensory and DTR levels are equal and symmetric in the upper and lower extremities. Cranial nerves II through XII are grossly intact. Proprioception is intact. No peripheral adenopathy or edema is identified. No motor or sensory levels are noted. Crude visual fields are within normal range.  RADIOLOGY RESULTS: CT scan with contrast has been ordered in 3 months  PLAN: at the present  time patient is doing well. I have ordered a follow-up in 3 months with a CT scan of the chest with contrast prior to that visit. Patient knows to call sooner with any concerns or complaints.  I would like to take this opportunity to thank you for allowing me to participate in the care of your patient.Angela Filbert, MD

## 2018-05-16 ENCOUNTER — Encounter: Payer: Self-pay | Admitting: Nurse Practitioner

## 2018-05-16 ENCOUNTER — Ambulatory Visit (INDEPENDENT_AMBULATORY_CARE_PROVIDER_SITE_OTHER): Payer: BLUE CROSS/BLUE SHIELD | Admitting: Nurse Practitioner

## 2018-05-16 ENCOUNTER — Other Ambulatory Visit: Payer: Self-pay | Admitting: Nurse Practitioner

## 2018-05-16 VITALS — BP 106/74 | HR 101 | Resp 16 | Ht 59.0 in | Wt 107.0 lb

## 2018-05-16 DIAGNOSIS — J449 Chronic obstructive pulmonary disease, unspecified: Secondary | ICD-10-CM

## 2018-05-16 DIAGNOSIS — E1165 Type 2 diabetes mellitus with hyperglycemia: Secondary | ICD-10-CM | POA: Diagnosis not present

## 2018-05-16 DIAGNOSIS — R300 Dysuria: Secondary | ICD-10-CM

## 2018-05-16 DIAGNOSIS — Z0001 Encounter for general adult medical examination with abnormal findings: Secondary | ICD-10-CM | POA: Diagnosis not present

## 2018-05-16 DIAGNOSIS — E785 Hyperlipidemia, unspecified: Secondary | ICD-10-CM

## 2018-05-16 DIAGNOSIS — I10 Essential (primary) hypertension: Secondary | ICD-10-CM

## 2018-05-16 DIAGNOSIS — E559 Vitamin D deficiency, unspecified: Secondary | ICD-10-CM

## 2018-05-16 DIAGNOSIS — Z124 Encounter for screening for malignant neoplasm of cervix: Secondary | ICD-10-CM

## 2018-05-16 DIAGNOSIS — Z1239 Encounter for other screening for malignant neoplasm of breast: Secondary | ICD-10-CM

## 2018-05-16 DIAGNOSIS — Z23 Encounter for immunization: Secondary | ICD-10-CM

## 2018-05-16 DIAGNOSIS — Z1231 Encounter for screening mammogram for malignant neoplasm of breast: Secondary | ICD-10-CM

## 2018-05-16 LAB — POCT GLYCOSYLATED HEMOGLOBIN (HGB A1C): Hemoglobin A1C: 5.6 % (ref 4.0–5.6)

## 2018-05-16 MED ORDER — SITAGLIPTIN PHOSPHATE 25 MG PO TABS: 25.0000 mg | ORAL_TABLET | Freq: Every day | ORAL | 3 refills | Status: DC

## 2018-05-16 MED ORDER — METFORMIN HCL ER 500 MG PO TB24: 500.0000 mg | ORAL_TABLET | Freq: Every day | ORAL | 3 refills | Status: DC

## 2018-05-16 MED ORDER — PNEUMOCOCCAL 13-VAL CONJ VACC IM SUSP: 0.5000 mL | INTRAMUSCULAR | 0 refills | Status: AC

## 2018-05-16 NOTE — Progress Notes (Signed)
[facility] [place], [place] [address]  Internal MEDICINE  Office Visit Note  Patient Name: Angela Jensen  [id number]  [id number]  Date of Service: 05/16/2018   Pt is here for routine health maintenance examination   Chief Complaint  Patient presents with  . Annual Exam    and pap smear   . Diabetes  . Hyperlipidemia  . Hypertension  . Depression  . Quality Metric Gaps    mammogram, eye exam , foot exam      Patient c/o of intermittent abdominal cramping. Feels like gas, but balls up directly under the left or right rib cage. She has to stretch and move around to get it to go away. Will generally resolve. No diarrhea, nausea, or vomiting associated with this. She feels like some medication she is taking or food she is eating increase this occurrence.   Diabetes  She presents for her follow-up diabetic visit. She has type 2 diabetes mellitus. No MedicAlert identification noted. Her disease course has been improving. There are no hypoglycemic associated symptoms. Pertinent negatives for hypoglycemia include no dizziness, headaches, nervousness/anxiousness or tremors. There are no diabetic associated symptoms. Pertinent negatives for diabetes include no chest pain, no fatigue, no polydipsia, no polyphagia and no polyuria. There are no hypoglycemic complications. Symptoms are improving. There are no diabetic complications. Risk factors for coronary artery disease include diabetes mellitus, dyslipidemia, hypertension and post-menopausal. Current diabetic treatment includes oral agent (dual therapy). She is compliant with treatment all of the time. Her weight is stable. She is following a generally healthy diet. Meal planning includes avoidance of concentrated sweets. She has had a previous visit with a dietitian. She participates in exercise intermittently. There is no change in her home blood glucose trend. An ACE inhibitor/angiotensin II receptor blocker is being  taken. She does not see a podiatrist.Eye exam is not current.    Current Medication: Outpatient Encounter Medications as of 05/16/2018  Medication Sig  . ACCU-CHEK FASTCLIX LANCETS MISC Use as directed twice a day diag E11.65  . albuterol (PROVENTIL) (2.5 MG/3ML) 0.083% nebulizer solution Take 3 mLs (2.5 mg total) by nebulization every 6 (six) hours as needed for wheezing.  Marland Kitchen amLODipine (NORVASC) 10 MG tablet Take 1 tablet (10 mg total) by mouth every morning.  Marland Kitchen aspirin EC 81 MG tablet Take 1 tablet (81 mg total) by mouth daily.  Marland Kitchen buPROPion (WELLBUTRIN XL) 150 MG 24 hr tablet TAKE 1 TABLET BY MOUTH TWICE DAILY FOR SMOKING CESSATION  . clopidogrel (PLAVIX) 75 MG tablet TAKE ONE TABLET BY MOUTH ONCE DAILY  . gabapentin (NEURONTIN) 100 MG capsule Take 1 capsule (100 mg total) by mouth 2 (two) times daily.  Marland Kitchen glucose blood (ACCU-CHEK AVIVA PLUS) test strip Use as instructed to check blood sugars three times daily.  E11.65  . lisinopril-hydrochlorothiazide (PRINZIDE,ZESTORETIC) 10-12.5 MG per tablet Take 1 tablet by mouth every morning.  . meloxicam (MOBIC) 7.5 MG tablet Take 1 tablet (7.5 mg total) by mouth 2 (two) times daily.  . mometasone-formoterol (DULERA) 200-5 MCG/ACT AERO Inhale 1 puff into the lungs 2 (two) times daily.  . OXYGEN Inhale 2 L into the lungs at bedtime.  . potassium chloride (K-DUR) 10 MEQ tablet TAKE 1 TABLET BY MOUTH EVERY OTHER DAY FOR  LOW  POTASSIUM  . PROAIR HFA 108 (90 Base) MCG/ACT inhaler INHALE 2 PUFFS BY MOUTH EVERY 4 TO 6 HOURS AS NEEDED FOR SHORTNESS OF BREATH  . rOPINIRole (REQUIP) 0.5 MG tablet Take  1 tablet (0.5 mg total) by mouth 2 (two) times daily.  . rosuvastatin (CRESTOR) 10 MG tablet Take 1 tablet (10 mg total) by mouth at bedtime.  Marland Kitchen tiotropium (SPIRIVA HANDIHALER) 18 MCG inhalation capsule Place 1 capsule (18 mcg total) into inhaler and inhale daily.  . [DISCONTINUED] azithromycin (ZITHROMAX Z-PAK) 250 MG tablet 1 pack; follow package directions  .  [DISCONTINUED] SitaGLIPtin-MetFORMIN HCl (JANUMET XR) 50-500 MG TB24 Take 1 tablet by mouth daily with breakfast.   . metFORMIN (GLUCOPHAGE-XR) 500 MG 24 hr tablet Take 1 tablet (500 mg total) by mouth daily with breakfast.  . sitaGLIPtin (JANUVIA) 25 MG tablet Take 1 tablet (25 mg total) by mouth daily.   No facility-administered encounter medications on file as of 05/16/2018.     Surgical History: Past Surgical History:  Procedure Laterality Date  . CESAREAN SECTION     x3  . COLONOSCOPY WITH PROPOFOL N/A 06/25/2015   Procedure: COLONOSCOPY WITH PROPOFOL;  Surgeon: Lucilla Lame, MD;  Location: [facility] ENDOSCOPY;  Service: Endoscopy;  Laterality: N/A;  . CYST REMOVAL LEG     and on shoulder   . LUNG BIOPSY  05 15 2013   has lung "spots"  . PERIPHERAL VASCULAR CATHETERIZATION Left 06/01/2016   Procedure: Lower Extremity Angiography;  Surgeon: Algernon Huxley, MD;  Location: [place] [facility];  Service: Cardiovascular;  Laterality: Left;  . PERIPHERAL VASCULAR CATHETERIZATION N/A 06/01/2016   Procedure: Abdominal Aortogram w/Lower Extremity;  Surgeon: Algernon Huxley, MD;  Location: [place] [facility];  Service: Cardiovascular;  Laterality: N/A;  . PERIPHERAL VASCULAR CATHETERIZATION  06/01/2016   Procedure: Lower Extremity Intervention;  Surgeon: Algernon Huxley, MD;  Location: [place] [facility];  Service: Cardiovascular;;  . PERIPHERAL VASCULAR CATHETERIZATION Right 06/08/2016   Procedure: Lower Extremity Angiography;  Surgeon: Algernon Huxley, MD;  Location: [place] [facility];  Service: Cardiovascular;  Laterality: Right;  . PERIPHERAL VASCULAR CATHETERIZATION  06/08/2016   Procedure: Lower Extremity Intervention;  Surgeon: Algernon Huxley, MD;  Location: [place] [facility];  Service: Cardiovascular;;    Medical History: Past Medical History:  Diagnosis Date  . Arthritis   . Asthma   . Atherosclerosis of native arteries of extremity with intermittent claudication ([place]) 05/26/2016  . Benign  neoplasm of sigmoid colon    pt denies   . Cancer [facility]) 2012   Right Lung CA  . COPD (chronic obstructive pulmonary disease) ([place])   . Depression   . Diabetes mellitus without complication [facility])    Patient takes Janumet  . Essential hypertension 05/26/2016  . Hypercholesteremia   . Hypertension   . 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 PAD (peripheral artery disease) ([place]) 06/22/2016  . Peripheral vascular disease ([place])   . Shortness of breath dyspnea    with exertion     Family History: Family History  Problem Relation Age of Onset  . Lung cancer Father   . Diabetes Mother   . Hypercholesterolemia Mother   . Diabetes Sister   . Diabetes Maternal Grandmother   . Diabetes Paternal Grandmother   . Diabetes Sister   . Heart attack Brother   . Coronary artery disease Brother   . Vascular Disease Brother   . Hypertension Sister   . Heart attack Brother       Review of Systems  Constitutional: Negative for chills, fatigue and unexpected weight change.  HENT: Negative for congestion, postnasal drip, rhinorrhea, sneezing and sore throat.  Eyes: Negative.  Negative for redness.  Respiratory: Positive for cough and wheezing. Negative for chest tightness and shortness of breath.        Intermittent and improving. Currently on antibiotics for URI.   Cardiovascular: Negative for chest pain and palpitations.  Gastrointestinal: Negative for abdominal pain, constipation, diarrhea, nausea and vomiting.       Intermittent gas pain which can be moderate to severe.   Endocrine: Negative for cold intolerance, heat intolerance, polydipsia, polyphagia and polyuria.       Blood sugars doing well   Genitourinary: Negative for dysuria and frequency.  Musculoskeletal: Negative for arthralgias, back pain, joint swelling and neck pain.  Skin: Negative for rash.  Allergic/Immunologic: Positive for environmental allergies.  Neurological: Negative for dizziness, tremors, numbness and headaches.   Hematological: Negative for adenopathy. Does not bruise/bleed easily.  Psychiatric/Behavioral: Negative for behavioral problems (Depression), sleep disturbance and suicidal ideas. The patient is not nervous/anxious.      Today's Vitals   05/16/18 1019  BP: 106/74  Pulse: (!) 101  Resp: 16  SpO2: 95%  Weight: 107 lb (48.5 kg)  Height: 4\' 11"  (1.499 m)    Physical Exam  Constitutional: She is oriented to person, place, and time. She appears well-developed and well-nourished. No distress.  HENT:  Head: Normocephalic and atraumatic.  Nose: Nose normal.  Mouth/Throat: Oropharynx is clear and moist. No oropharyngeal exudate.  Eyes: Pupils are equal, round, and reactive to light. Conjunctivae and EOM are normal.  Neck: Normal range of motion. Neck supple. No JVD present. Carotid bruit is not present. No tracheal deviation present. No thyromegaly present.  Cardiovascular: Normal rate, regular rhythm, normal heart sounds and intact distal pulses. Exam reveals no gallop and no friction rub.  No murmur heard. Pulses:      Dorsalis pedis pulses are 1+ on the right side, and 1+ on the left side.       Posterior tibial pulses are 1+ on the right side, and 1+ on the left side.  Pulmonary/Chest: Effort normal. No respiratory distress. She has wheezes. She has no rales. She exhibits no tenderness. Right breast exhibits no inverted nipple, no mass, no nipple discharge, no skin change and no tenderness. Left breast exhibits no inverted nipple, no mass, no nipple discharge, no skin change and no tenderness.  Breath sounds are slightly diminished throughout the lung fields with very scant wheeze in left upper lobe of the lungs.   Abdominal: Soft. Bowel sounds are normal. There is no tenderness.  Genitourinary: Vagina normal and uterus normal.  Genitourinary Comments: No tenderness, masses, or organomeglay present during bimanual exam .  Musculoskeletal: Normal range of motion.       Left foot: There  is normal range of motion and no deformity.  Feet:  Right Foot:  Protective Sensation: 10 sites tested. 10 sites sensed.  Left Foot:  Protective Sensation: 10 sites tested. 10 sites sensed.  Lymphadenopathy:    She has no cervical adenopathy.  Neurological: She is alert and oriented to person, place, and time. No cranial nerve deficit.  Skin: Skin is warm and dry. Capillary refill takes less than 2 seconds. She is not diaphoretic.  Psychiatric: She has a normal mood and affect. Her behavior is normal. Judgment and thought content normal.  Nursing note and vitals reviewed.    LABS: Recent Results (from the past 2160 hour(s))  POCT HgB A1C     Status: None   Collection Time: 05/16/18 10:44 AM  Result Value Ref  Range   Hemoglobin A1C 5.6 4.0 - 5.6 %   HbA1c POC (<> result, manual entry)     HbA1c, POC (prediabetic range)     HbA1c, POC (controlled diabetic range)     Assessment/Plan: 1. Encounter for general adult medical examination with abnormal findings Annual health maintenance exam today.  - T4, free - TSH  2. Uncontrolled type 2 diabetes mellitus with hyperglycemia (HCC) - POCT HgB A1C 5.6 today. Decrease Januvia to 25mg  tablets. contiue metformin XR 500mg  daily. Monitor blood sugars closely. Adjust medication as indicated. Refer for diabetic eye exam.  - sitaGLIPtin (JANUVIA) 25 MG tablet; Take 1 tablet (25 mg total) by mouth daily.  Dispense: 30 tablet; Refill: 3 - metFORMIN (GLUCOPHAGE-XR) 500 MG 24 hr tablet; Take 1 tablet (500 mg total) by mouth daily with breakfast.  Dispense: 30 tablet; Refill: 3 - Ambulatory referral to Ophthalmology - T4, free - TSH  3. Essential hypertension Stable. Continue bp medication as prescribed.   4. Hyperlipidemia, unspecified hyperlipidemia type - Lipid panel  5. Chronic obstructive pulmonary disease, unspecified COPD type ([place]) Continue inhalers and respiratory medications as prescribed.   6. Routine cervical smear - Pap IG and  HPV (high risk) DNA detection  7. Vitamin D deficiency - Vitamin D 1,25 dihydroxy  8. Screening for breast cancer - MM DIGITAL SCREENING BILATERAL; Future  9. Flu vaccine need - Flu Vaccine MDCK QUAD PF  10. Dysuria - UA/M w/rflx Culture, Routine  General Counseling: Jacci verbalizes understanding of the findings of todays visit and agrees with plan of treatment. I have discussed any further diagnostic evaluation that may be needed or ordered today. We also reviewed her medications today. she has been encouraged to call the office with any questions or concerns that should arise related to todays visit.    Counseling:  Diabetes Counseling:  1. Addition of ACE inh/ ARB'S for nephroprotection. Microalbumin is updated  2. Diabetic foot care, prevention of complications. Podiatry consult 3. Exercise and lose weight.  4. Diabetic eye examination, Diabetic eye exam is updated  5. Monitor blood sugar closlely. nutrition counseling.  6. Sign and symptoms of hypoglycemia including shaking sweating,confusion and headaches.  This patient was seen by Leretha Pol FNP Collaboration with Dr [name] as a part of collaborative care agreement  Orders Placed This Encounter  Procedures  . MM DIGITAL SCREENING BILATERAL  . Flu Vaccine MDCK QUAD PF  . UA/M w/rflx Culture, Routine  . T4, free  . TSH  . Lipid panel  . Vitamin D 1,25 dihydroxy  . Ambulatory referral to Ophthalmology  . POCT HgB A1C    Meds ordered this encounter  Medications  . sitaGLIPtin (JANUVIA) 25 MG tablet    Sig: Take 1 tablet (25 mg total) by mouth daily.    Dispense:  30 tablet    Refill:  3    Please d/c Janumet    Order Specific Question:   Supervising Provider    Answer:   [name] [[id number]]  . metFORMIN (GLUCOPHAGE-XR) 500 MG 24 hr tablet    Sig: Take 1 tablet (500 mg total) by mouth daily with breakfast.    Dispense:  30 tablet    Refill:  3    Please d/c Jamumet XR 50/500    Order Specific  Question:   Supervising Provider    Answer:   [name] [[id number]]    Time spent: [place], MD  Internal Medicine

## 2018-05-18 ENCOUNTER — Other Ambulatory Visit: Payer: Self-pay | Admitting: Nurse Practitioner

## 2018-05-18 ENCOUNTER — Other Ambulatory Visit: Payer: Self-pay

## 2018-05-18 LAB — UA/M W/RFLX CULTURE, ROUTINE
Bilirubin, UA: NEGATIVE
GLUCOSE, UA: NEGATIVE
Nitrite, UA: NEGATIVE
PH UA: 5 (ref 5.0–7.5)
RBC, UA: NEGATIVE
SPEC GRAV UA: 1.024 (ref 1.005–1.030)
Urobilinogen, Ur: 1 mg/dL (ref 0.2–1.0)

## 2018-05-18 LAB — MICROSCOPIC EXAMINATION
RBC MICROSCOPIC, UA: NONE SEEN /HPF (ref 0–2)
WBC, UA: >30 /hpf — AB (ref 0–5)

## 2018-05-18 LAB — URINE CULTURE, REFLEX: ORGANISM ID, BACTERIA: NO GROWTH

## 2018-05-18 MED ORDER — POTASSIUM CHLORIDE ER 10 MEQ PO TBCR
EXTENDED_RELEASE_TABLET | ORAL | 2 refills | Status: DC
Start: 2018-05-18 — End: 2018-05-23

## 2018-05-18 MED ORDER — ALBUTEROL SULFATE (2.5 MG/3ML) 0.083% IN NEBU: 2.5000 mg | INHALATION_SOLUTION | Freq: Four times a day (QID) | RESPIRATORY_TRACT | 3 refills | Status: DC | PRN

## 2018-05-18 MED ORDER — LISINOPRIL-HYDROCHLOROTHIAZIDE 10-12.5 MG PO TABS: 1.0000 | ORAL_TABLET | Freq: Every morning | ORAL | 5 refills | Status: DC

## 2018-05-19 LAB — PAP IG AND HPV HIGH-RISK
HPV, HIGH-RISK: NEGATIVE
PAP SMEAR COMMENT: 0

## 2018-05-23 ENCOUNTER — Other Ambulatory Visit: Payer: Self-pay | Admitting: Nurse Practitioner

## 2018-05-23 MED ORDER — POTASSIUM CHLORIDE ER 10 MEQ PO TBCR
EXTENDED_RELEASE_TABLET | ORAL | 2 refills | Status: DC
Start: 2018-05-23 — End: 2018-11-17

## 2018-05-30 ENCOUNTER — Telehealth: Payer: Self-pay

## 2018-05-30 NOTE — Telephone Encounter (Signed)
-----   Message from Ronnell Freshwater, NP sent at 05/29/2018  2:49 PM EDT ----- Please let the patient know that her mammogram was negative/normal. thanks

## 2018-05-30 NOTE — Telephone Encounter (Signed)
Informed pt mammogram results

## 2018-05-30 NOTE — Telephone Encounter (Signed)
Left message on pt voicemail to call back to get mammogram results.

## 2018-06-16 ENCOUNTER — Other Ambulatory Visit (INDEPENDENT_AMBULATORY_CARE_PROVIDER_SITE_OTHER): Payer: Self-pay | Admitting: Vascular Surgery

## 2018-06-21 ENCOUNTER — Other Ambulatory Visit: Payer: Self-pay | Admitting: Nurse Practitioner

## 2018-06-22 ENCOUNTER — Other Ambulatory Visit: Payer: Self-pay | Admitting: Nurse Practitioner

## 2018-06-23 LAB — CBC
Hematocrit: 38.7 % (ref 34.0–46.6)
Hemoglobin: 12.4 g/dL (ref 11.1–15.9)
MCH: 27.9 pg (ref 26.6–33.0)
MCHC: 32 g/dL (ref 31.5–35.7)
MCV: 87 fL (ref 79–97)
PLATELETS: 251 10*3/uL (ref 150–450)
RBC: 4.45 x10E6/uL (ref 3.77–5.28)
RDW: 12.6 % (ref 12.3–15.4)
WBC: 4.4 10*3/uL (ref 3.4–10.8)

## 2018-06-23 LAB — T3: T3, Total: 113 ng/dL (ref 71–180)

## 2018-06-23 LAB — LIPID PANEL W/O CHOL/HDL RATIO
CHOLESTEROL TOTAL: 218 mg/dL — AB (ref 100–199)
HDL: 66 mg/dL (ref >39)
LDL Calculated: 115 mg/dL — ABNORMAL HIGH (ref 0–99)
Triglycerides: 183 mg/dL — ABNORMAL HIGH (ref 0–149)
VLDL Cholesterol Cal: 37 mg/dL (ref 5–40)

## 2018-06-23 LAB — BASIC METABOLIC PANEL
BUN / CREAT RATIO: 17 (ref 12–28)
BUN: 14 mg/dL (ref 8–27)
CHLORIDE: 97 mmol/L (ref 96–106)
CO2: 27 mmol/L (ref 20–29)
Calcium: 9.4 mg/dL (ref 8.7–10.3)
Creatinine, Ser: 0.84 mg/dL (ref 0.57–1.00)
GFR calc non Af Amer: 75 mL/min/{1.73_m2} (ref >59)
GFR, EST AFRICAN AMERICAN: 86 mL/min/{1.73_m2} (ref >59)
GLUCOSE: 116 mg/dL — AB (ref 65–99)
POTASSIUM: 4.1 mmol/L (ref 3.5–5.2)
Sodium: 139 mmol/L (ref 134–144)

## 2018-06-23 LAB — TSH: TSH: 0.498 u[IU]/mL (ref 0.450–4.500)

## 2018-06-23 LAB — VITAMIN D 25 HYDROXY (VIT D DEFICIENCY, FRACTURES): Vit D, 25-Hydroxy: 17 ng/mL — ABNORMAL LOW (ref 30.0–100.0)

## 2018-06-23 LAB — T4, FREE: Free T4: 1.22 ng/dL (ref 0.82–1.77)

## 2018-06-28 ENCOUNTER — Ambulatory Visit
Admission: RE | Admit: 2018-06-28 | Discharge: 2018-06-28 | Disposition: A | Discharge status: Home / Self Care | Payer: BLUE CROSS/BLUE SHIELD | Source: Ambulatory Visit | Attending: Nurse Practitioner | Admitting: Nurse Practitioner

## 2018-06-28 DIAGNOSIS — Z1239 Encounter for other screening for malignant neoplasm of breast: Secondary | ICD-10-CM | POA: Diagnosis present

## 2018-06-28 HISTORY — DX: Personal history of irradiation: Z92.3

## 2018-07-06 ENCOUNTER — Inpatient Hospital Stay: Payer: BLUE CROSS/BLUE SHIELD

## 2018-07-06 ENCOUNTER — Ambulatory Visit: Payer: BLUE CROSS/BLUE SHIELD

## 2018-07-11 ENCOUNTER — Inpatient Hospital Stay: Payer: BLUE CROSS/BLUE SHIELD | Admitting: Hematology and Oncology

## 2018-07-15 ENCOUNTER — Other Ambulatory Visit: Payer: Self-pay | Admitting: Internal Medicine

## 2018-07-18 ENCOUNTER — Ambulatory Visit
Admission: RE | Admit: 2018-07-18 | Discharge: 2018-07-18 | Disposition: A | Discharge status: Home / Self Care | Payer: BLUE CROSS/BLUE SHIELD | Source: Ambulatory Visit | Attending: Hematology and Oncology | Admitting: Hematology and Oncology

## 2018-07-18 ENCOUNTER — Inpatient Hospital Stay: Payer: BLUE CROSS/BLUE SHIELD | Attending: Hematology and Oncology

## 2018-07-18 DIAGNOSIS — Z923 Personal history of irradiation: Secondary | ICD-10-CM | POA: Diagnosis not present

## 2018-07-18 DIAGNOSIS — C3431 Malignant neoplasm of lower lobe, right bronchus or lung: Secondary | ICD-10-CM | POA: Diagnosis not present

## 2018-07-18 DIAGNOSIS — F17220 Nicotine dependence, chewing tobacco, uncomplicated: Secondary | ICD-10-CM | POA: Diagnosis not present

## 2018-07-18 DIAGNOSIS — J449 Chronic obstructive pulmonary disease, unspecified: Secondary | ICD-10-CM | POA: Insufficient documentation

## 2018-07-18 DIAGNOSIS — R911 Solitary pulmonary nodule: Secondary | ICD-10-CM | POA: Diagnosis not present

## 2018-07-18 DIAGNOSIS — Z87891 Personal history of nicotine dependence: Secondary | ICD-10-CM | POA: Insufficient documentation

## 2018-07-18 LAB — COMPREHENSIVE METABOLIC PANEL
ALT: 18 U/L (ref 0–44)
AST: 21 U/L (ref 15–41)
Albumin: 4.1 g/dL (ref 3.5–5.0)
Alkaline Phosphatase: 62 U/L (ref 38–126)
Anion gap: 8 (ref 5–15)
BUN: 18 mg/dL (ref 8–23)
CO2: 29 mmol/L (ref 22–32)
Calcium: 9.1 mg/dL (ref 8.9–10.3)
Chloride: 103 mmol/L (ref 98–111)
Creatinine, Ser: 0.85 mg/dL (ref 0.44–1.00)
GFR calc Af Amer: >60 mL/min (ref >60)
GFR calc non Af Amer: >60 mL/min (ref >60)
Glucose, Bld: 105 mg/dL — ABNORMAL HIGH (ref 70–99)
Potassium: 3.8 mmol/L (ref 3.5–5.1)
Sodium: 140 mmol/L (ref 135–145)
Total Bilirubin: 0.3 mg/dL (ref 0.3–1.2)
Total Protein: 7.4 g/dL (ref 6.5–8.1)

## 2018-07-18 LAB — CBC WITH DIFFERENTIAL/PLATELET
Abs Immature Granulocytes: 0.01 10*3/uL (ref 0.00–0.07)
Basophils Absolute: 0 10*3/uL (ref 0.0–0.1)
Basophils Relative: 1 %
Eosinophils Absolute: 0.1 10*3/uL (ref 0.0–0.5)
Eosinophils Relative: 3 %
HCT: 36.7 % (ref 36.0–46.0)
Hemoglobin: 11.6 g/dL — ABNORMAL LOW (ref 12.0–15.0)
Immature Granulocytes: 0 %
Lymphocytes Relative: 19 %
Lymphs Abs: 0.7 10*3/uL (ref 0.7–4.0)
MCH: 28.9 pg (ref 26.0–34.0)
MCHC: 31.6 g/dL (ref 30.0–36.0)
MCV: 91.5 fL (ref 80.0–100.0)
Monocytes Absolute: 0.4 10*3/uL (ref 0.1–1.0)
Monocytes Relative: 10 %
Neutro Abs: 2.6 10*3/uL (ref 1.7–7.7)
Neutrophils Relative %: 67 %
Platelets: 211 10*3/uL (ref 150–400)
RBC: 4.01 MIL/uL (ref 3.87–5.11)
RDW: 13.9 % (ref 11.5–15.5)
WBC: 3.8 10*3/uL — ABNORMAL LOW (ref 4.0–10.5)
nRBC: 0 % (ref 0.0–0.2)

## 2018-07-22 ENCOUNTER — Inpatient Hospital Stay (HOSPITAL_BASED_OUTPATIENT_CLINIC_OR_DEPARTMENT_OTHER): Payer: BLUE CROSS/BLUE SHIELD | Admitting: Hematology and Oncology

## 2018-07-22 ENCOUNTER — Other Ambulatory Visit: Payer: Self-pay

## 2018-07-22 ENCOUNTER — Encounter: Payer: Self-pay | Admitting: Hematology and Oncology

## 2018-07-22 VITALS — BP 104/70 | HR 70 | Temp 98.0°F | Resp 12 | Ht 59.0 in | Wt 113.5 lb

## 2018-07-22 DIAGNOSIS — Z79899 Other long term (current) drug therapy: Secondary | ICD-10-CM

## 2018-07-22 DIAGNOSIS — R970 Elevated carcinoembryonic antigen [CEA]: Secondary | ICD-10-CM

## 2018-07-22 DIAGNOSIS — Z7982 Long term (current) use of aspirin: Secondary | ICD-10-CM

## 2018-07-22 DIAGNOSIS — Z87891 Personal history of nicotine dependence: Secondary | ICD-10-CM | POA: Diagnosis not present

## 2018-07-22 DIAGNOSIS — C3411 Malignant neoplasm of upper lobe, right bronchus or lung: Secondary | ICD-10-CM

## 2018-07-22 DIAGNOSIS — C3431 Malignant neoplasm of lower lobe, right bronchus or lung: Secondary | ICD-10-CM

## 2018-07-22 DIAGNOSIS — Z923 Personal history of irradiation: Secondary | ICD-10-CM

## 2018-07-22 NOTE — Progress Notes (Signed)
[facility] day:  07/22/2018   Chief Complaint: Angela Jensen is a 62 y.o. female with a history of stage I lung cancer s/p radiation who is seen for 3 month assessment.  HPI: The patient was last seen in the medical oncology clinic on 05/06/2018.  At that time, she felt "good".  She had chronic shortness of breath with exertion.  Weight was up 5 pounds.  Exam was stable.  Chest CT on 07/18/2018 revealed early postradiation change in the peripheral right lower lobe at the site of the treated tumor, with no residual measurable pulmonary nodule in this location.  There were no findings of metastatic disease in the chest.  During the interim, patient is getting over a cold. She complains of a chronic cough since having her biopsy in June 2019.   Past Medical History:  Diagnosis Date  . Arthritis   . Asthma   . Atherosclerosis of native arteries of extremity with intermittent claudication ([place]) 05/26/2016  . Cancer [facility]) 2012   Right Lung CA  . COPD (chronic obstructive pulmonary disease) ([place])   . Depression   . Diabetes mellitus without complication [facility])    Patient takes Janumet  . Essential hypertension 05/26/2016  . Hypercholesteremia   . Hypertension   . 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 PAD (peripheral artery disease) ([place]) 06/22/2016  . Peripheral vascular disease ([place])   . Personal history of radiation therapy   . Shortness of breath dyspnea    with exertion     Past Surgical History:  Procedure Laterality Date  . CESAREAN SECTION     x3  . COLONOSCOPY WITH PROPOFOL N/A 06/25/2015   Procedure: COLONOSCOPY WITH PROPOFOL;  Surgeon: Lucilla Lame, MD;  Location: [facility] ENDOSCOPY;  Service: Endoscopy;  Laterality: N/A;  . CYST REMOVAL LEG     and on shoulder   . LUNG BIOPSY  05 15 2013   has lung "spots"  . PERIPHERAL VASCULAR CATHETERIZATION Left 06/01/2016   Procedure: Lower Extremity Angiography;  Surgeon: Algernon Huxley, MD;   Location: [place] CV LAB;  Service: Cardiovascular;  Laterality: Left;  . PERIPHERAL VASCULAR CATHETERIZATION N/A 06/01/2016   Procedure: Abdominal Aortogram w/Lower Extremity;  Surgeon: Algernon Huxley, MD;  Location: [place] CV LAB;  Service: Cardiovascular;  Laterality: N/A;  . PERIPHERAL VASCULAR CATHETERIZATION  06/01/2016   Procedure: Lower Extremity Intervention;  Surgeon: Algernon Huxley, MD;  Location: [place] CV LAB;  Service: Cardiovascular;;  . PERIPHERAL VASCULAR CATHETERIZATION Right 06/08/2016   Procedure: Lower Extremity Angiography;  Surgeon: Algernon Huxley, MD;  Location: [place] [facility];  Service: Cardiovascular;  Laterality: Right;  . PERIPHERAL VASCULAR CATHETERIZATION  06/08/2016   Procedure: Lower Extremity Intervention;  Surgeon: Algernon Huxley, MD;  Location: [place] CV LAB;  Service: Cardiovascular;;    Family History  Problem Relation Age of Onset  . Lung cancer Father   . Diabetes Mother   . Hypercholesterolemia Mother   . Diabetes Sister   . Diabetes Maternal Grandmother   . Diabetes Paternal Grandmother   . Diabetes Sister   . Heart attack Brother   . Coronary artery disease Brother   . Vascular Disease Brother   . Hypertension Sister   . Heart attack Brother     Social History:  reports that she quit smoking about 8 years ago. Her smoking use included cigarettes. She has a 37.00 pack-year smoking history.  She has quit using smokeless tobacco.  Her smokeless tobacco use included snuff. She reports that she drinks about 5.0 standard drinks of alcohol per week. She reports that she has current or past drug history. Drugs: Marijuana, "Crack" cocaine, and Cocaine.  She has not smoked for 1 month.  She started smoking at 17 until she was "6 something".   Her boyfriend is [name]: [phone].  She lives in [place].  The patient is alone today.  Allergies: No Known Allergies  Current Medications: Current Outpatient Medications   Medication Sig Dispense Refill  . ACCU-CHEK FASTCLIX LANCETS MISC Use as directed twice a day diag E11.65 100 each 1  . albuterol (PROVENTIL) (2.5 MG/3ML) 0.083% nebulizer solution Take 3 mLs (2.5 mg total) by nebulization every 6 (six) hours as needed for wheezing. 75 mL 3  . amLODipine (NORVASC) 10 MG tablet Take 1 tablet (10 mg total) by mouth every morning. 30 tablet 5  . aspirin EC 81 MG tablet Take 1 tablet (81 mg total) by mouth daily. 150 tablet 2  . buPROPion (WELLBUTRIN XL) 150 MG 24 hr tablet TAKE 1 TABLET BY MOUTH TWICE DAILY FOR SMOKING CESSATION 60 tablet 6  . clopidogrel (PLAVIX) 75 MG tablet TAKE 1 TABLET BY MOUTH ONCE DAILY 30 tablet 4  . gabapentin (NEURONTIN) 100 MG capsule Take 1 capsule (100 mg total) by mouth 2 (two) times daily. 60 capsule 5  . glucose blood (ACCU-CHEK AVIVA PLUS) test strip Use as instructed to check blood sugars three times daily.  E11.65 250 each 5  . lisinopril-hydrochlorothiazide (PRINZIDE,ZESTORETIC) 10-12.5 MG tablet Take 1 tablet by mouth every morning. 30 tablet 5  . meloxicam (MOBIC) 7.5 MG tablet Take 1 tablet (7.5 mg total) by mouth 2 (two) times daily. 60 tablet 3  . metFORMIN (GLUCOPHAGE-XR) 500 MG 24 hr tablet Take 1 tablet (500 mg total) by mouth daily with breakfast. 30 tablet 3  . mometasone-formoterol (DULERA) 200-5 MCG/ACT AERO Inhale 1 puff into the lungs 2 (two) times daily. 1 Inhaler 5  . OXYGEN Inhale 2 L into the lungs at bedtime.    . potassium chloride (K-DUR) 10 MEQ tablet TAKE 1 TABLET BY MOUTH EVERY OTHER DAY FOR  LOW  POTASSIUM 15 tablet 2  . PROAIR HFA 108 (90 Base) MCG/ACT inhaler INHALE 2 PUFFS INTO LUNGS EVERY 4 TO 6 HOURS AS NEEDED FOR SHORTNESS OF BREATH 9 each 6  . rOPINIRole (REQUIP) 0.5 MG tablet TAKE 1 TABLET BY MOUTH TWICE DAILY 180 tablet 0  . rosuvastatin (CRESTOR) 10 MG tablet Take 1 tablet (10 mg total) by mouth at bedtime. 30 tablet 5  . sitaGLIPtin (JANUVIA) 25 MG tablet Take 1 tablet (25 mg total) by mouth  daily. 30 tablet 3  . tiotropium (SPIRIVA HANDIHALER) 18 MCG inhalation capsule Place 1 capsule (18 mcg total) into inhaler and inhale daily. 30 capsule 5   No current facility-administered medications for this visit.     Review of Systems  Constitutional: Negative.  Negative for chills, fever, malaise/fatigue and weight loss (up 1 pound).       Feels "fine".  HENT: Negative.  Negative for congestion, ear discharge, ear pain, nosebleeds, sinus pain, sore throat and tinnitus.   Eyes: Negative.  Negative for double vision, photophobia, pain and discharge.  Respiratory: Positive for shortness of breath (exertional). Negative for cough, hemoptysis and sputum production.   Cardiovascular: Negative.  Negative for chest pain, palpitations, orthopnea, leg swelling and PND.  Gastrointestinal: Negative.  Negative for  abdominal pain, blood in stool, constipation, diarrhea, melena, nausea and vomiting.  Genitourinary: Negative.  Negative for frequency, hematuria and urgency.  Musculoskeletal: Positive for joint pain (shoulder). Negative for back pain, falls, myalgias and neck pain.  Skin: Negative.  Negative for itching and rash.  Neurological: Negative.  Negative for dizziness, tingling, sensory change, speech change, focal weakness, weakness and headaches.  Endo/Heme/Allergies: Negative.  Does not bruise/bleed easily.       Diabetes.  Psychiatric/Behavioral: Negative.  Negative for depression and memory loss. The patient is not nervous/anxious and does not have insomnia.   All other systems reviewed and are negative.  Performance status (ECOG):  1  Physical Exam: Blood pressure 104/70, pulse 70, temperature 98 F (36.7 C), temperature source Tympanic, resp. rate 12, height '4\' 11"'  (1.499 m), weight 113 lb 8 oz (51.5 kg). GENERAL:  Thin woman sitting comfortably in the exam room in no acute distress. MENTAL STATUS:  Alert and oriented to person, place and time. HEAD:  Normocephalic, atraumatic,  face symmetric, no Cushingoid features. EYES:  Brown eyes.  Pupils equal round and reactive to light and accomodation.  No conjunctivitis or scleral icterus. ENT:  Oropharynx clear without lesion.  Tongue normal. Mucous membranes moist.  RESPIRATORY:  Clear to auscultation without rales, wheezes or rhonchi. CARDIOVASCULAR:  Regular rate and rhythm without murmur, rub or gallop. ABDOMEN:  Soft, non-tender, with active bowel sounds, and no hepatosplenomegaly.  No masses. SKIN:  No rashes, ulcers or lesions. EXTREMITIES: No edema, no skin discoloration or tenderness.  No palpable cords. LYMPH NODES: No palpable cervical, supraclavicular, axillary or inguinal adenopathy  NEUROLOGICAL: Unremarkable. PSYCH:  Appropriate.    No visits with results within 3 Day(s) from this visit.  Latest known visit with results is:  Appointment on 07/18/2018  Component Date Value Ref Range Status  . WBC 07/18/2018 3.8* 4.0 - 10.5 K/uL Final  . RBC 07/18/2018 4.01  3.87 - 5.11 MIL/uL Final  . Hemoglobin 07/18/2018 11.6* 12.0 - 15.0 g/dL Final  . HCT 07/18/2018 36.7  36.0 - 46.0 % Final  . MCV 07/18/2018 91.5  80.0 - 100.0 fL Final  . MCH 07/18/2018 28.9  26.0 - 34.0 pg Final  . MCHC 07/18/2018 31.6  30.0 - 36.0 g/dL Final  . RDW 07/18/2018 13.9  11.5 - 15.5 % Final  . Platelets 07/18/2018 211  150 - 400 K/uL Final  . nRBC 07/18/2018 0.0  0.0 - 0.2 % Final  . Neutrophils Relative % 07/18/2018 67  % Final  . Neutro Abs 07/18/2018 2.6  1.7 - 7.7 K/uL Final  . Lymphocytes Relative 07/18/2018 19  % Final  . Lymphs Abs 07/18/2018 0.7  0.7 - 4.0 K/uL Final  . Monocytes Relative 07/18/2018 10  % Final  . Monocytes Absolute 07/18/2018 0.4  0.1 - 1.0 K/uL Final  . Eosinophils Relative 07/18/2018 3  % Final  . Eosinophils Absolute 07/18/2018 0.1  0.0 - 0.5 K/uL Final  . Basophils Relative 07/18/2018 1  % Final  . Basophils Absolute 07/18/2018 0.0  0.0 - 0.1 K/uL Final  . Immature Granulocytes 07/18/2018 0  % Final   . Abs Immature Granulocytes 07/18/2018 0.01  0.00 - 0.07 K/uL Final   Performed at [facility], [address] [place], [place] [address]  . Sodium 07/18/2018 140  135 - 145 mmol/L Final  . Potassium 07/18/2018 3.8  3.5 - 5.1 mmol/L Final  . Chloride 07/18/2018 103  98 - 111 mmol/L Final  . CO2  07/18/2018 29  22 - 32 mmol/L Final  . Glucose, Bld 07/18/2018 105* 70 - 99 mg/dL Final  . BUN 07/18/2018 18  8 - 23 mg/dL Final  . Creatinine, Ser 07/18/2018 0.85  0.44 - 1.00 mg/dL Final  . Calcium 07/18/2018 9.1  8.9 - 10.3 mg/dL Final  . Total Protein 07/18/2018 7.4  6.5 - 8.1 g/dL Final  . Albumin 07/18/2018 4.1  3.5 - 5.0 g/dL Final  . AST 07/18/2018 21  15 - 41 U/L Final  . ALT 07/18/2018 18  0 - 44 U/L Final  . Alkaline Phosphatase 07/18/2018 62  38 - 126 U/L Final  . Total Bilirubin 07/18/2018 0.3  0.3 - 1.2 mg/dL Final  . GFR calc non Af Amer 07/18/2018 >60  >60 mL/min Final  . GFR calc Af Amer 07/18/2018 >60  >60 mL/min Final  . Anion gap 07/18/2018 8  5 - 15 Final   Performed at [facility], [address] [place], [place] [address]    Assessment:  Angela Jensen is a 62 y.o. 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   She was not a good surgery candidate secondary to her lung function (FEV1 28%).  Chest CT on 07/31/2011 revealed a 1.1 x 0.6 cm RUL nodule and a 9 mm inferior aspect RUL nodule and a 4 mm nodule in the anterior aspect of the inferior aspect of the RUL.  PET scan on 08/03/2011 revealed a 10-mm spiculated right apical pulmonary nodule (SUV 2.5) suspcious for lung cancer. Left ovary was prominent (3.9 x 2.4 cm).   CA-125 was 9.3 (normal).  CT guided biopsy of the superior and inferior RUL nodules on 12/30/2011 revealed well differentiated adenocarcinoma of lung primary.  EGFR testing revealed no mutation.  She received IMRT 6000 cGy to both nodules from 01/26/2012 - 03/10/2012.  PET scan on 01/30/2015  revealed a hypermetabolic and enlarging RUL pulmonary nodules concerning for lung cancer recurrence.  There was a small focus of peripheral nodular consolidation in the superior segment of the right lower lobe with differential including inflammation/infection versus neoplasm.  There was no evidence of mediastinal nodal metastasis.    PET scan on 03/27/2016 revealed no hypermetabolic activity to suggest recurrent malignancy. There was a soft tissue density in the left pelvis near the inguinal ring that was not hypermetabolic, but prominent and probably separate from the left ovary. This could be a wandering fibroid.  Although the overall appearance had not changed from 2012 and was not hypermetabolic, pelvic sonography or pelvic MRI for further characterization of the uterus and ovaries can be considered.  In 04/2015, there was an attempt at radiofrequency ablation of 1 enlarging RUL nodule.  At the time of the procedure, the nodule had shrunk to 5 mm from 9 mm.    Chest CT on 08/01/2015 revealed complete resolution of the prior nodule in the right lung apex.   Chest CT on 01/31/2016 revealed no active cardiopulmonary abnormalities.  There was stable appearance of right apical and right middle lobe scar like densities.   Low dose chest CT on 02/02/2017 revealed new ground-glass nodule in the right lower lobe. Lung-RADS 3 (probably benign findings).   Low dose chest CT  on 07/29/2017 demonstrated enlargement of the previously observed sub-solid pulmonary lesion. RIGHT upper lobe lesion measured 1.4 x 2.1 x 1.6 cm (previously 1.5 x 1.1 x 1.7 cm). There was also areas of focal architectural distortion  in the RIGHT upper and middle lobes that were felt to be most compatible with areas of chronic post infectious or inflammatory scarring. Aortic value calcifications were noted. Lung-RADS 4BS (suspicious).  Chest CT on 01/19/2018 revealed a subsolid 2.1 cm peripheral right lower lobe pulmonary nodule.  Although  not appreciably changed in size, subjectively this nodule had increased in density, and remains suspicious for primary bronchogenic adenocarcinoma.  There was no thoracic adenopathy.  CT guided RLL biopsy on 02/09/2018 revealed adenocarcinoma of lung origin, lepidic and papillary patterns.    She received 6000 cGy in 5 fractions to RLL nodule from 03/22/2018 - 04/05/2018.  Chest CT on 07/18/2018 revealed early postradiation change in the peripheral right lower lobe at the site of the treated tumor, with no residual measurable pulmonary nodule in this location.  There were no findings of metastatic disease in the chest.  She has an elevated CEA of unclear etiology.  CEA was 8.1 on 01/31/2016, 9.2 on 03/03/2016, and 7.9 on 10/12/2017.  Colonoscopy on 06/25/2015 revealed a 4 mm polyp in the sigmoid colon.  Pathology revealed a tubular adenoma without evidence of dysplasia or malignancy.  Pelvic MRI on 04/30/2016 revealed a 3.4 x 2.5 cm ovoid mass in the LEFT adnexa with signal characteristics identical to the intramural leiomyomas and consistent with an exophytic leiomyoma.  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  Endometrium was normal with a normal junctional zone.  Ovaries were not identified.  Symptomatically, she feels "fine".  She has stable shortness of breath on exertion.  Exam is stable.  Plan: 1.   Labs today:  CBC with diff, CMP. 2.   Stage I adenocarcinoma RUL:  s/p IMRT (completed 03/10/2012).  Review interval chest CT.  Chest CT personally reviewed with patient.  Agree with radiology interpretation.  Discuss plan for follow-up chest CT in 6 months. 3.   Right lower lobe adenocarcinoma:  s/p SBRT (completed 04/05/2018).  Review inteval chest CT.  Chest CT personally reviewed with patient.  Agree with radiology interpretation.  Follow-up chest CT in 6 months. 4.   Chest CT without contrast on 01/17/2019. 5.   RTC on 01/19/2019 in [place] for MD assessment, labs (CBC with diff,  CMP, CEA), and review of chest CT.   Honor Loh, NP  07/22/2018, 3:59 PM   I saw and evaluated the patient, participating in the key portions of the service and reviewing pertinent diagnostic studies and records.  I reviewed the nurse practitioner's note and agree with the findings and the plan.  The assessment and plan were discussed with the patient.  Several questions were asked by the patient and answered.   Nolon Stalls, MD 07/22/2018,3:59 PM

## 2018-07-22 NOTE — Progress Notes (Signed)
See follow up.

## 2018-08-04 ENCOUNTER — Ambulatory Visit
Admission: RE | Admit: 2018-08-04 | Discharge: 2018-08-04 | Disposition: A | Discharge status: Home / Self Care | Payer: BLUE CROSS/BLUE SHIELD | Source: Ambulatory Visit | Attending: Radiation Oncology | Admitting: Radiation Oncology

## 2018-08-04 ENCOUNTER — Other Ambulatory Visit: Payer: Self-pay

## 2018-08-04 ENCOUNTER — Encounter: Payer: Self-pay | Admitting: Radiation Oncology

## 2018-08-04 VITALS — BP 183/108 | HR 110 | Temp 98.4°F | Resp 16 | Wt 110.8 lb

## 2018-08-04 DIAGNOSIS — Z923 Personal history of irradiation: Secondary | ICD-10-CM | POA: Insufficient documentation

## 2018-08-04 DIAGNOSIS — C3431 Malignant neoplasm of lower lobe, right bronchus or lung: Secondary | ICD-10-CM | POA: Diagnosis not present

## 2018-08-04 DIAGNOSIS — Z87891 Personal history of nicotine dependence: Secondary | ICD-10-CM | POA: Diagnosis not present

## 2018-08-04 NOTE — Progress Notes (Signed)
Radiation Oncology Follow up Note  Name: Angela Jensen   Date:   08/04/2018 MRN:  [id number] DOB: Feb 21, 1956    This 62 y.o. female presents to the clinic today for four-month follow-up status post SB RT to her right upper lobe for salvage treatment and inpatient previous injury 6 years prior for non-small cell lung cancer.she had previous treatment was noted to have a right lower lobe lesion which was increasing in size biopsy was positive for adenocarcinoma. She seen today in routine follow-up is doing well. She specifically denies cough hemoptysis or chest tightness. She had a recent CT scan showing early post radiation changes in the peripheral right lower lobe at the site of treated tumor with no residual measurable for pulmonary nodule in this location. No other evidence of progression of disease in her chest.  REFERRING PROVIDER: Ronnell Freshwater, NP  HPI: as above.  COMPLICATIONS OF TREATMENT: none  FOLLOW UP COMPLIANCE: keeps appointments   PHYSICAL EXAM:  BP (!) 183/108 (BP Location: Left Arm, Patient Position: Sitting)   Pulse (!) 110   Temp 98.4 F (36.9 C) (Tympanic)   Resp 16   Wt 110 lb 12.5 oz (50.3 kg)   BMI 22.38 kg/m  Well-developed well-nourished patient in NAD. HEENT reveals PERLA, EOMI, discs not visualized.  Oral cavity is clear. No oral mucosal lesions are identified. Neck is clear without evidence of cervical or supraclavicular adenopathy. Lungs are clear to A&P. Cardiac examination is essentially unremarkable with regular rate and rhythm without murmur rub or thrill. Abdomen is benign with no organomegaly or masses noted. Motor sensory and DTR levels are equal and symmetric in the upper and lower extremities. Cranial nerves II through XII are grossly intact. Proprioception is intact. No peripheral adenopathy or edema is identified. No motor or sensory levels are noted. Crude visual fields are within normal range.  RADIOLOGY RESULTS: serial CT scans  reviewed  PLAN: present time patient is doing well under excellent control by cT criteria. I'm please with her overall progress. I've asked to see her back in 6 months with a repeat CT scan of her chest with contrast prior to that visit. Patient is to call anytime with any concerns.  I would like to take this opportunity to thank you for allowing me to participate in the care of your patient.Noreene Filbert, MD

## 2018-08-15 ENCOUNTER — Other Ambulatory Visit: Payer: Self-pay

## 2018-08-15 DIAGNOSIS — E1165 Type 2 diabetes mellitus with hyperglycemia: Secondary | ICD-10-CM

## 2018-08-15 MED ORDER — LISINOPRIL-HYDROCHLOROTHIAZIDE 10-12.5 MG PO TABS: 1.0000 | ORAL_TABLET | Freq: Every morning | ORAL | 5 refills | Status: DC

## 2018-08-15 MED ORDER — METFORMIN HCL ER 500 MG PO TB24: 500.0000 mg | ORAL_TABLET | Freq: Every day | ORAL | 3 refills | Status: DC

## 2018-08-18 ENCOUNTER — Encounter: Payer: Self-pay | Admitting: Nurse Practitioner

## 2018-08-18 ENCOUNTER — Ambulatory Visit (INDEPENDENT_AMBULATORY_CARE_PROVIDER_SITE_OTHER): Payer: BLUE CROSS/BLUE SHIELD | Admitting: Nurse Practitioner

## 2018-08-18 VITALS — BP 146/98 | HR 106 | Resp 16 | Ht 59.0 in | Wt 113.2 lb

## 2018-08-18 DIAGNOSIS — J449 Chronic obstructive pulmonary disease, unspecified: Secondary | ICD-10-CM

## 2018-08-18 DIAGNOSIS — E119 Type 2 diabetes mellitus without complications: Secondary | ICD-10-CM | POA: Diagnosis not present

## 2018-08-18 DIAGNOSIS — E785 Hyperlipidemia, unspecified: Secondary | ICD-10-CM

## 2018-08-18 DIAGNOSIS — I10 Essential (primary) hypertension: Secondary | ICD-10-CM | POA: Diagnosis not present

## 2018-08-18 LAB — POCT GLYCOSYLATED HEMOGLOBIN (HGB A1C): HEMOGLOBIN A1C: 5.9 % — AB (ref 4.0–5.6)

## 2018-08-18 NOTE — Progress Notes (Signed)
[facility] [place], [place] [address]  Internal MEDICINE  Office Visit Note  Patient Name: Angela Jensen  [id number]  [id number]  Date of Service: 08/18/2018  Chief Complaint  Patient presents with  . Medical Management of Chronic Issues    3 month follow up    The patient is here for routine follow up for diabetes. Blood sugars are well controlled. HgbA1c is 5.9 today. She has no concerns or complaints. She did have cold over the holidays. Does have mild, residual cough. Denies chest pain or chest pressure. Does have some shortness of breath which is controlled with inhalers.  She had routine, fasting labs done 06/2018. Cholesterol panel showing mild, generalized elevation, but were, otherwise, good.  Blood pressure slightly elevated today. States that she has not taken her blood pressure medication yet as she has not eaten. Does not take any of her medication until she eats.       Current Medication: Outpatient Encounter Medications as of 08/18/2018  Medication Sig  . ACCU-CHEK FASTCLIX LANCETS MISC Use as directed twice a day diag E11.65  . albuterol (PROVENTIL) (2.5 MG/3ML) 0.083% nebulizer solution Take 3 mLs (2.5 mg total) by nebulization every 6 (six) hours as needed for wheezing.  Marland Kitchen amLODipine (NORVASC) 10 MG tablet Take 1 tablet (10 mg total) by mouth every morning.  Marland Kitchen aspirin EC 81 MG tablet Take 1 tablet (81 mg total) by mouth daily.  Marland Kitchen buPROPion (WELLBUTRIN XL) 150 MG 24 hr tablet TAKE 1 TABLET BY MOUTH TWICE DAILY FOR SMOKING CESSATION  . clopidogrel (PLAVIX) 75 MG tablet TAKE 1 TABLET BY MOUTH ONCE DAILY  . gabapentin (NEURONTIN) 100 MG capsule Take 1 capsule (100 mg total) by mouth 2 (two) times daily.  Marland Kitchen glucose blood (ACCU-CHEK AVIVA PLUS) test strip Use as instructed to check blood sugars three times daily.  E11.65  . lisinopril-hydrochlorothiazide (PRINZIDE,ZESTORETIC) 10-12.5 MG tablet Take 1 tablet by mouth every morning.  . meloxicam  (MOBIC) 7.5 MG tablet Take 1 tablet (7.5 mg total) by mouth 2 (two) times daily.  . metFORMIN (GLUCOPHAGE-XR) 500 MG 24 hr tablet Take 1 tablet (500 mg total) by mouth daily with breakfast.  . mometasone-formoterol (DULERA) 200-5 MCG/ACT AERO Inhale 1 puff into the lungs 2 (two) times daily.  . OXYGEN Inhale 2 L into the lungs at bedtime.  . potassium chloride (K-DUR) 10 MEQ tablet TAKE 1 TABLET BY MOUTH EVERY OTHER DAY FOR  LOW  POTASSIUM  . PROAIR HFA 108 (90 Base) MCG/ACT inhaler INHALE 2 PUFFS INTO LUNGS EVERY 4 TO 6 HOURS AS NEEDED FOR SHORTNESS OF BREATH  . rOPINIRole (REQUIP) 0.5 MG tablet TAKE 1 TABLET BY MOUTH TWICE DAILY  . rosuvastatin (CRESTOR) 10 MG tablet Take 1 tablet (10 mg total) by mouth at bedtime.  . sitaGLIPtin (JANUVIA) 25 MG tablet Take 1 tablet (25 mg total) by mouth daily.  Marland Kitchen tiotropium (SPIRIVA HANDIHALER) 18 MCG inhalation capsule Place 1 capsule (18 mcg total) into inhaler and inhale daily.   No facility-administered encounter medications on file as of 08/18/2018.     Surgical History: Past Surgical History:  Procedure Laterality Date  . CESAREAN SECTION     x3  . COLONOSCOPY WITH PROPOFOL N/A 06/25/2015   Procedure: COLONOSCOPY WITH PROPOFOL;  Surgeon: Lucilla Lame, MD;  Location: [facility] ENDOSCOPY;  Service: Endoscopy;  Laterality: N/A;  . CYST REMOVAL LEG     and on shoulder   . LUNG BIOPSY  05 15 2013  has lung "spots"  . PERIPHERAL VASCULAR CATHETERIZATION Left 06/01/2016   Procedure: Lower Extremity Angiography;  Surgeon: Algernon Huxley, MD;  Location: [place] [facility];  Service: Cardiovascular;  Laterality: Left;  . PERIPHERAL VASCULAR CATHETERIZATION N/A 06/01/2016   Procedure: Abdominal Aortogram w/Lower Extremity;  Surgeon: Algernon Huxley, MD;  Location: [place] [facility];  Service: Cardiovascular;  Laterality: N/A;  . PERIPHERAL VASCULAR CATHETERIZATION  06/01/2016   Procedure: Lower Extremity Intervention;  Surgeon: Algernon Huxley, MD;  Location: [place] [facility];  Service: Cardiovascular;;  . PERIPHERAL VASCULAR CATHETERIZATION Right 06/08/2016   Procedure: Lower Extremity Angiography;  Surgeon: Algernon Huxley, MD;  Location: [place] [facility];  Service: Cardiovascular;  Laterality: Right;  . PERIPHERAL VASCULAR CATHETERIZATION  06/08/2016   Procedure: Lower Extremity Intervention;  Surgeon: Algernon Huxley, MD;  Location: [place] [facility];  Service: Cardiovascular;;    Medical History: Past Medical History:  Diagnosis Date  . Arthritis   . Asthma   . Atherosclerosis of native arteries of extremity with intermittent claudication ([place]) 05/26/2016  . Cancer [facility]) 2012   Right Lung CA  . COPD (chronic obstructive pulmonary disease) ([place])   . Depression   . Diabetes mellitus without complication [facility])    Patient takes Janumet  . Essential hypertension 05/26/2016  . Hypercholesteremia   . Hypertension   . 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 PAD (peripheral artery disease) ([place]) 06/22/2016  . Peripheral vascular disease ([place])   . Personal history of radiation therapy   . Shortness of breath dyspnea    with exertion     Family History: Family History  Problem Relation Age of Onset  . Lung cancer Father   . Diabetes Mother   . Hypercholesterolemia Mother   . Diabetes Sister   . Diabetes Maternal Grandmother   . Diabetes Paternal Grandmother   . Diabetes Sister   . Heart attack Brother   . Coronary artery disease Brother   . Vascular Disease Brother   . Hypertension Sister   . Heart attack Brother     Social History   Socioeconomic History  . Marital status: Widowed    Spouse name: Not on file  . Number of children: Not on file  . Years of education: Not on file  . Highest education level: Not on file  Occupational History  . Not on file  Social Needs  . Financial resource strain: Not on file  . Food insecurity:    Worry: Not on file    Inability: Not on file  . Transportation needs:    Medical: Not on  file    Non-medical: Not on file  Tobacco Use  . Smoking status: Former Smoker    Packs/day: 1.00    Years: 37.00    Pack years: 37.00    Types: Cigarettes    Last attempt to quit: 02/06/2010    Years since quitting: 8.5  . Smokeless tobacco: Former Systems developer    Types: Snuff  Substance and Sexual Activity  . Alcohol use: Yes    Alcohol/week: 5.0 standard drinks    Types: 5 Cans of beer per week    Comment: /h x of alcohol abuse -stopped 2012- now drinks 5 beer per w  . Drug use: Not Currently    Types: Marijuana, "Crack" cocaine, Cocaine    Comment: hx of cocaine use- last use 2015; last use sat marijuana6/22/19,   . Sexual activity: Yes  Lifestyle  . Physical activity:    Days per week: Not on file    Minutes per session: Not on file  . Stress: Not on file  Relationships  . Social connections:    Talks on phone: Not on file    Gets together: Not on file    Attends religious service: Not on file    Active member of club or organization: Not on file    Attends meetings of clubs or organizations: Not on file    Relationship status: Not on file  . Intimate partner violence:    Fear of current or ex partner: Not on file    Emotionally abused: Not on file    Physically abused: Not on file    Forced sexual activity: Not on file  Other Topics Concern  . Not on file  Social History Narrative  . Not on file      Review of Systems  Constitutional: Negative for chills, fatigue and unexpected weight change.  HENT: Negative for congestion, postnasal drip, rhinorrhea, sneezing and sore throat.   Eyes: Negative.  Negative for redness.  Respiratory: Positive for cough and wheezing. Negative for chest tightness and shortness of breath.        Intermittent.   Cardiovascular: Negative for chest pain and palpitations.  Gastrointestinal: Negative for abdominal pain, constipation, diarrhea, nausea and vomiting.  Endocrine: Negative for cold intolerance, heat intolerance, polydipsia and  polyuria.       Blood sugars doing well   Genitourinary: Negative for dysuria and frequency.  Musculoskeletal: Negative for arthralgias, back pain, joint swelling and neck pain.  Skin: Negative for rash.  Allergic/Immunologic: Positive for environmental allergies.  Neurological: Negative for dizziness, tremors, numbness and headaches.  Hematological: Negative for adenopathy. Does not bruise/bleed easily.  Psychiatric/Behavioral: Negative for behavioral problems (Depression), sleep disturbance and suicidal ideas. The patient is not nervous/anxious.    Today's Vitals   08/18/18 0948  BP: (!) 146/98  Pulse: (!) 106  Resp: 16  SpO2: 95%  Weight: 113 lb 3.2 oz (51.3 kg)  Height: 4\' 11"  (1.499 m)  Body mass index is 22.86 kg/m.  Physical Exam Vitals signs and nursing note reviewed.  Constitutional:      General: She is not in acute distress.    Appearance: Normal appearance. She is well-developed. She is not diaphoretic.  HENT:     Head: Normocephalic and atraumatic.     Mouth/Throat:     Pharynx: No oropharyngeal exudate.  Eyes:     Extraocular Movements: Extraocular movements intact.     Pupils: Pupils are equal, round, and reactive to light.  Neck:     Musculoskeletal: Normal range of motion and neck supple.     Thyroid: No thyromegaly.     Vascular: No JVD.     Trachea: No tracheal deviation.  Cardiovascular:     Rate and Rhythm: Normal rate and regular rhythm.     Heart sounds: Normal heart sounds. No murmur. No friction rub. No gallop.   Pulmonary:     Effort: Pulmonary effort is normal. No respiratory distress.     Breath sounds: No wheezing or rales.     Comments: Congested lung suonds in left lung fields. Clear effectively with cough.  Chest:     Chest wall: No tenderness.  Abdominal:     General: Bowel sounds are normal.     Palpations: Abdomen is soft.  Musculoskeletal: Normal range of motion.  Lymphadenopathy:     Cervical: No cervical  adenopathy.  Skin:     General: Skin is warm and dry.  Neurological:     Mental Status: She is alert and oriented to person, place, and time.     Cranial Nerves: No cranial nerve deficit.  Psychiatric:        Behavior: Behavior normal.        Thought Content: Thought content normal.        Judgment: Judgment normal.    Assessment/Plan:  1. Type 2 diabetes mellitus without complication, without long-term current use of insulin (HCC) - POCT HgB A1C 5.9 today. Continue diabetic medication as prescribed. Will get new meter and strips ordered when patiet finds out whiat supplies her insurance will cover.   2. Essential hypertension Stable. Continue bp medication as prescribed.   3. Hyperlipidemia, unspecified hyperlipidemia type Reviewed recent labs. Mild, generalized elevation of cholesterol panel. Will continue crestor as prescribed. Prudent diet discussed.   4. Chronic obstructive pulmonary disease, unspecified COPD type ([place]) Overall well amanged. Continue to use inhalers as prescribed. Regular visits with pulmonology as scheduled.   General Counseling: Sheranda verbalizes understanding of the findings of todays visit and agrees with plan of treatment. I have discussed any further diagnostic evaluation that may be needed or ordered today. We also reviewed her medications today. she has been encouraged to call the office with any questions or concerns that should arise related to todays visit.  Diabetes Counseling:  1. Addition of ACE inh/ ARB'S for nephroprotection. Microalbumin is updated  2. Diabetic foot care, prevention of complications. Podiatry consult 3. Exercise and lose weight.  4. Diabetic eye examination, Diabetic eye exam is updated  5. Monitor blood sugar closlely. nutrition counseling.  6. Sign and symptoms of hypoglycemia including shaking sweating,confusion and headaches.  This patient was seen by Leretha Pol FNP Collaboration with Dr [name] as a part of collaborative care  agreement  Orders Placed This Encounter  Procedures  . POCT HgB A1C     Time spent: 25 Minutes      Dr [name] Internal medicine

## 2018-08-22 ENCOUNTER — Other Ambulatory Visit: Payer: Self-pay

## 2018-08-22 MED ORDER — MELOXICAM 7.5 MG PO TABS: 7.5000 mg | ORAL_TABLET | Freq: Two times a day (BID) | ORAL | 3 refills | Status: DC

## 2018-08-24 ENCOUNTER — Encounter: Payer: Self-pay | Admitting: Nurse Practitioner

## 2018-09-19 ENCOUNTER — Other Ambulatory Visit: Payer: Self-pay | Admitting: Internal Medicine

## 2018-09-19 ENCOUNTER — Ambulatory Visit (INDEPENDENT_AMBULATORY_CARE_PROVIDER_SITE_OTHER): Payer: BLUE CROSS/BLUE SHIELD | Admitting: Nurse Practitioner

## 2018-09-19 ENCOUNTER — Ambulatory Visit (INDEPENDENT_AMBULATORY_CARE_PROVIDER_SITE_OTHER): Payer: BLUE CROSS/BLUE SHIELD

## 2018-09-19 ENCOUNTER — Encounter (INDEPENDENT_AMBULATORY_CARE_PROVIDER_SITE_OTHER): Payer: Self-pay | Admitting: Nurse Practitioner

## 2018-09-19 VITALS — BP 125/85 | HR 101 | Resp 12 | Ht 58.5 in | Wt 110.0 lb

## 2018-09-19 DIAGNOSIS — E1165 Type 2 diabetes mellitus with hyperglycemia: Secondary | ICD-10-CM

## 2018-09-19 DIAGNOSIS — Z87891 Personal history of nicotine dependence: Secondary | ICD-10-CM

## 2018-09-19 DIAGNOSIS — J449 Chronic obstructive pulmonary disease, unspecified: Secondary | ICD-10-CM | POA: Diagnosis not present

## 2018-09-19 DIAGNOSIS — I739 Peripheral vascular disease, unspecified: Secondary | ICD-10-CM

## 2018-09-19 DIAGNOSIS — I70213 Atherosclerosis of native arteries of extremities with intermittent claudication, bilateral legs: Secondary | ICD-10-CM

## 2018-09-19 DIAGNOSIS — I10 Essential (primary) hypertension: Secondary | ICD-10-CM

## 2018-09-19 MED ORDER — ALBUTEROL SULFATE (2.5 MG/3ML) 0.083% IN NEBU: 2.5000 mg | INHALATION_SOLUTION | Freq: Four times a day (QID) | RESPIRATORY_TRACT | 3 refills | Status: DC | PRN

## 2018-09-19 MED ORDER — BUPROPION HCL ER (XL) 150 MG PO TB24: ORAL_TABLET | ORAL | 6 refills | Status: DC

## 2018-09-19 MED ORDER — SITAGLIPTIN PHOSPHATE 25 MG PO TABS: 25.0000 mg | ORAL_TABLET | Freq: Every day | ORAL | 3 refills | Status: DC

## 2018-09-20 ENCOUNTER — Encounter (INDEPENDENT_AMBULATORY_CARE_PROVIDER_SITE_OTHER): Payer: Self-pay

## 2018-09-20 ENCOUNTER — Encounter (INDEPENDENT_AMBULATORY_CARE_PROVIDER_SITE_OTHER): Payer: Self-pay | Admitting: Nurse Practitioner

## 2018-09-20 ENCOUNTER — Other Ambulatory Visit: Payer: Self-pay

## 2018-09-20 MED ORDER — ROSUVASTATIN CALCIUM 10 MG PO TABS: 10.0000 mg | ORAL_TABLET | Freq: Every day | ORAL | 5 refills | Status: DC

## 2018-09-20 NOTE — Progress Notes (Signed)
Subjective:    [name], female    DOB: 1956-02-13, 63 y.o.   MRN: [id number] Chief Complaint  Patient presents with  . Follow-up    HPI  Angela Jensen is a 63 y.o. female that presents today for follow-up studies for her peripheral arterial disease.  The patient has had previous intervention on her left lower extremity and 2017 of both her lower extremities..  The patient endorses having claudication of her left lower extremity especially in her calf.  She denies any rest pain like symptoms and she denies any lower extremity wounds or ulcerations.  There have been no major changes in her health since her last visit.  The patient denies amaurosis fugax or recent TIA symptoms. There are no recent neurological changes noted. The patient denies history of DVT, PE or superficial thrombophlebitis. The patient denies recent episodes of angina or shortness of breath.   ABI's Rt=1.05 and Lt=0.63 (previous ABI's Rt=1.02 and Lt=0.75) The patient TBI of her left lower extremity is 0.06, compared to 0.56 of the previous study.  The previous study was done on 03/18/2018.  The right lower extremity has triphasic waveforms in the tibial arteries with strong toe waveforms.  The left lower extremity has monophasic waveforms in the tibial arteries with weak toe digits  Past Medical History:  Diagnosis Date  . Arthritis   . Asthma   . Atherosclerosis of native arteries of extremity with intermittent claudication ([place]) 05/26/2016  . Cancer [facility]) 2012   Right Lung CA  . COPD (chronic obstructive pulmonary disease) ([place])   . Depression   . Diabetes mellitus without complication [facility])    Patient takes Janumet  . Essential hypertension 05/26/2016  . Hypercholesteremia   . Hypertension   . 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 PAD (peripheral artery disease) ([place]) 06/22/2016  . Peripheral vascular disease ([place])   . Personal history of radiation therapy   . Shortness of breath dyspnea    with exertion     Past Surgical History:  Procedure Laterality Date  . CESAREAN SECTION     x3  . COLONOSCOPY WITH PROPOFOL N/A 06/25/2015   Procedure: COLONOSCOPY WITH PROPOFOL;  Surgeon: Lucilla Lame, MD;  Location: [facility] ENDOSCOPY;  Service: Endoscopy;  Laterality: N/A;  . CYST REMOVAL LEG     and on shoulder   . LUNG BIOPSY  05 15 2013   has lung "spots"  . PERIPHERAL VASCULAR CATHETERIZATION Left 06/01/2016   Procedure: Lower Extremity Angiography;  Surgeon: Algernon Huxley, MD;  Location: [place] [facility];  Service: Cardiovascular;  Laterality: Left;  . PERIPHERAL VASCULAR CATHETERIZATION N/A 06/01/2016   Procedure: Abdominal Aortogram w/Lower Extremity;  Surgeon: Algernon Huxley, MD;  Location: [place] [facility];  Service: Cardiovascular;  Laterality: N/A;  . PERIPHERAL VASCULAR CATHETERIZATION  06/01/2016   Procedure: Lower Extremity Intervention;  Surgeon: Algernon Huxley, MD;  Location: [place] [facility];  Service: Cardiovascular;;  . PERIPHERAL VASCULAR CATHETERIZATION Right 06/08/2016   Procedure: Lower Extremity Angiography;  Surgeon: Algernon Huxley, MD;  Location: [place] [facility];  Service: Cardiovascular;  Laterality: Right;  . PERIPHERAL VASCULAR CATHETERIZATION  06/08/2016   Procedure: Lower Extremity Intervention;  Surgeon: Algernon Huxley, MD;  Location: [place] [facility];  Service: Cardiovascular;;    Social History   Socioeconomic History  . Marital status: Widowed    Spouse name: Not on file  . Number of children: Not  on file  . Years of education: Not on file  . Highest education level: Not on file  Occupational History  . Not on file  Social Needs  . Financial resource strain: Not on file  . Food insecurity:    Worry: Not on file    Inability: Not on file  . Transportation needs:    Medical: Not on file    Non-medical: Not on file  Tobacco Use  . Smoking status: Former Smoker    Packs/day: 1.00    Years: 37.00    Pack years: 37.00    Types:  Cigarettes    Last attempt to quit: 02/06/2010    Years since quitting: 8.6  . Smokeless tobacco: Former Systems developer    Types: Snuff  Substance and Sexual Activity  . Alcohol use: Yes    Alcohol/week: 5.0 standard drinks    Types: 5 Cans of beer per week    Comment: /h x of alcohol abuse -stopped 2012- now drinks 5 beer per w  . Drug use: Not Currently    Types: Marijuana, "Crack" cocaine, Cocaine    Comment: hx of cocaine use- last use 2015; last use sat marijuana6/22/19,   . Sexual activity: Yes  Lifestyle  . Physical activity:    Days per week: Not on file    Minutes per session: Not on file  . Stress: Not on file  Relationships  . Social connections:    Talks on phone: Not on file    Gets together: Not on file    Attends religious service: Not on file    Active member of club or organization: Not on file    Attends meetings of clubs or organizations: Not on file    Relationship status: Not on file  . Intimate partner violence:    Fear of current or ex partner: Not on file    Emotionally abused: Not on file    Physically abused: Not on file    Forced sexual activity: Not on file  Other Topics Concern  . Not on file  Social History Narrative  . Not on file    Family History  Problem Relation Age of Onset  . Lung cancer Father   . Diabetes Mother   . Hypercholesterolemia Mother   . Diabetes Sister   . Diabetes Maternal Grandmother   . Diabetes Paternal Grandmother   . Diabetes Sister   . Heart attack Brother   . Coronary artery disease Brother   . Vascular Disease Brother   . Hypertension Sister   . Heart attack Brother     No Known Allergies   Review of Systems   Review of Systems: Negative Unless Checked Constitutional: [] Weight loss  [] Fever  [] Chills Cardiac: [] Chest pain   []  Atrial Fibrillation  [] Palpitations   [] Shortness of breath when laying flat   [] Shortness of breath with exertion. [] Shortness of breath at rest Vascular:  [] Pain in legs with walking    [] Pain in legs with standing [] Pain in legs when laying flat   [x] Claudication    [] Pain in feet when laying flat    [] History of DVT   [] Phlebitis   [] Swelling in legs   [] Varicose veins   [] Non-healing ulcers Pulmonary:   [] Uses home oxygen   [] Productive cough   [] Hemoptysis   [] Wheeze  [x] COPD   [] Asthma Neurologic:  [] Dizziness   [] Seizures  [] Blackouts [] History of stroke   [] History of TIA  [] Aphasia   [] Temporary Blindness   [] Weakness or  numbness in arm   [] Weakness or numbness in leg Musculoskeletal:   [x] Joint swelling   [x] Joint pain   [] Low back pain  []  History of Knee Replacement [] Arthritis [] back Surgeries  []  Spinal Stenosis    Hematologic:  [] Easy bruising  [] Easy bleeding   [] Hypercoagulable state   [] Anemic Gastrointestinal:  [] Diarrhea   [] Vomiting  [] Gastroesophageal reflux/heartburn   [] Difficulty swallowing. [] Abdominal pain Genitourinary:  [] Chronic kidney disease   [] Difficult urination  [] Anuric   [] Blood in urine [] Frequent urination  [] Burning with urination   [] Hematuria Skin:  [] Rashes   [] Ulcers [] Wounds Psychological:  [] History of anxiety   []  History of major depression  []  Memory Difficulties     Objective:   Physical Exam  BP 125/85 (BP Location: Right Arm, Patient Position: Sitting)   Pulse (!) 101   Resp 12   Ht 4' 10.5" (1.486 m)   Wt 110 lb (49.9 kg)   BMI 22.60 kg/m   Gen: WD/WN, NAD Head: [place]/AT, No temporalis wasting.  Ear/Nose/Throat: Hearing grossly intact, nares w/o erythema or drainage Eyes: PER, EOMI, sclera nonicteric.  Neck: Supple, no masses.  No JVD.  Pulmonary:  Good air movement, no use of accessory muscles.  Cardiac: RRR Vascular:  Vessel Right Left  Radial Palpable Palpable  Dorsalis Pedis Palpable Not Palpable  Posterior Tibial Palpable Not Palpable   Gastrointestinal: soft, non-distended. No guarding/no peritoneal signs.  Musculoskeletal: M/S 5/5 throughout.  No deformity or atrophy.  Neurologic: Pain and light touch  intact in extremities.  Symmetrical.  Speech is fluent. Motor exam as listed above. Psychiatric: Judgment intact, Mood & affect appropriate for pt's clinical situation. Dermatologic: No Venous rashes. No Ulcers Noted.  No changes consistent with cellulitis. Lymph : No Cervical lymphadenopathy, no lichenification or skin changes of chronic lymphedema.      Assessment & Plan:   1. Atherosclerosis of native artery of both lower extremities with intermittent claudication (HCC) Recommend:  The patient has experienced increased symptoms and is now describing lifestyle limiting claudication and mild rest pain.   Given the severity of the patient's lower extremity symptoms the patient should undergo angiography and intervention.  Risk and benefits were reviewed the patient.  Indications for the procedure were reviewed.  All questions were answered, the patient agrees to proceed.   The patient should continue walking and begin a more formal exercise program.  The patient should continue antiplatelet therapy and aggressive treatment of the lipid abnormalities  The patient will follow up with me after the angiogram.   2. Chronic obstructive pulmonary disease, unspecified COPD type ([place]) Continue pulmonary medications and aerosols as already ordered, these medications have been reviewed and there are no changes at this time.    3. Essential hypertension Continue antihypertensive medications as already ordered, these medications have been reviewed and there are no changes at this time.    Current Outpatient Medications on File Prior to Visit  Medication Sig Dispense Refill  . ACCU-CHEK FASTCLIX LANCETS MISC Use as directed twice a day diag E11.65 100 each 1  . amLODipine (NORVASC) 10 MG tablet Take 1 tablet (10 mg total) by mouth every morning. 30 tablet 5  . aspirin EC 81 MG tablet Take 1 tablet (81 mg total) by mouth daily. 150 tablet 2  . clopidogrel (PLAVIX) 75 MG tablet TAKE 1 TABLET BY  MOUTH ONCE DAILY 30 tablet 4  . gabapentin (NEURONTIN) 100 MG capsule Take 1 capsule (100 mg total) by mouth 2 (two) times daily. [place]  capsule 5  . glucose blood (ACCU-CHEK AVIVA PLUS) test strip Use as instructed to check blood sugars three times daily.  E11.65 250 each 5  . lisinopril-hydrochlorothiazide (PRINZIDE,ZESTORETIC) 10-12.5 MG tablet Take 1 tablet by mouth every morning. 30 tablet 5  . meloxicam (MOBIC) 7.5 MG tablet Take 1 tablet (7.5 mg total) by mouth 2 (two) times daily. 60 tablet 3  . metFORMIN (GLUCOPHAGE-XR) 500 MG 24 hr tablet Take 1 tablet (500 mg total) by mouth daily with breakfast. 30 tablet 3  . mometasone-formoterol (DULERA) 200-5 MCG/ACT AERO Inhale 1 puff into the lungs 2 (two) times daily. 1 Inhaler 5  . OXYGEN Inhale 2 L into the lungs at bedtime.    . potassium chloride (K-DUR) 10 MEQ tablet TAKE 1 TABLET BY MOUTH EVERY OTHER DAY FOR  LOW  POTASSIUM 15 tablet 2  . PROAIR HFA 108 (90 Base) MCG/ACT inhaler INHALE 2 PUFFS INTO LUNGS EVERY 4 TO 6 HOURS AS NEEDED FOR SHORTNESS OF BREATH 9 each 6  . rOPINIRole (REQUIP) 0.5 MG tablet TAKE 1 TABLET BY MOUTH TWICE DAILY 180 tablet 0  . rosuvastatin (CRESTOR) 10 MG tablet Take 1 tablet (10 mg total) by mouth at bedtime. 30 tablet 5   No current facility-administered medications on file prior to visit.     There are no Patient Instructions on file for this visit. No follow-ups on file.   Kris Hartmann, NP  This note was completed with Sales executive.  Any errors are purely unintentional.

## 2018-09-21 ENCOUNTER — Telehealth (INDEPENDENT_AMBULATORY_CARE_PROVIDER_SITE_OTHER): Payer: Self-pay

## 2018-09-21 NOTE — Telephone Encounter (Signed)
Patient called and left a message on the triage line and stated that she needs a refill on her blood thinner medication but was told by the pharmacy that it is being denied. Do not see a denial in chart. Please advise.

## 2018-09-22 ENCOUNTER — Other Ambulatory Visit (INDEPENDENT_AMBULATORY_CARE_PROVIDER_SITE_OTHER): Payer: Self-pay | Admitting: Nurse Practitioner

## 2018-09-22 DIAGNOSIS — I739 Peripheral vascular disease, unspecified: Secondary | ICD-10-CM

## 2018-09-22 MED ORDER — CLOPIDOGREL BISULFATE 75 MG PO TABS: 75.0000 mg | ORAL_TABLET | Freq: Every day | ORAL | 11 refills | Status: DC

## 2018-09-22 NOTE — Telephone Encounter (Signed)
Patient states she was told it was sent here for a refill but that the doctor is not responding to them.

## 2018-09-22 NOTE — Telephone Encounter (Signed)
I would clarify if they said it is denied or if she just needs refills.  If she just needs refills, I can send that in.

## 2018-09-22 NOTE — Telephone Encounter (Signed)
Patient aware that medication was sent.

## 2018-09-22 NOTE — Telephone Encounter (Signed)
I sent another RX with refills, but we hadn't gotten any requests

## 2018-09-26 ENCOUNTER — Other Ambulatory Visit (INDEPENDENT_AMBULATORY_CARE_PROVIDER_SITE_OTHER): Payer: Self-pay | Admitting: Nurse Practitioner

## 2018-09-27 ENCOUNTER — Encounter
Admission: RE | Admit: 2018-09-27 | Discharge: 2018-09-27 | Disposition: A | Discharge status: Home / Self Care | Payer: BLUE CROSS/BLUE SHIELD | Source: Ambulatory Visit | Attending: Vascular Surgery | Admitting: Vascular Surgery

## 2018-09-27 DIAGNOSIS — I7090 Unspecified atherosclerosis: Secondary | ICD-10-CM | POA: Diagnosis not present

## 2018-09-27 DIAGNOSIS — Z01812 Encounter for preprocedural laboratory examination: Secondary | ICD-10-CM | POA: Insufficient documentation

## 2018-09-27 HISTORY — DX: Sleep apnea, unspecified: G47.30

## 2018-09-27 LAB — CREATININE, SERUM
Creatinine, Ser: 0.68 mg/dL (ref 0.44–1.00)
GFR calc Af Amer: >60 mL/min (ref >60)
GFR calc non Af Amer: >60 mL/min (ref >60)

## 2018-09-27 LAB — BUN: BUN: 13 mg/dL (ref 8–23)

## 2018-09-28 MED ORDER — DEXTROSE 5 % IV SOLN
2.0000 g | Freq: Once | INTRAVENOUS | Status: AC
  Administered 2018-09-29: 2 g via INTRAVENOUS
  Filled 2018-09-28: qty 20

## 2018-09-29 ENCOUNTER — Encounter
Admission: RE | Disposition: A | Discharge status: Home / Self Care | Payer: Self-pay | Source: Home / Self Care | Attending: Vascular Surgery

## 2018-09-29 ENCOUNTER — Other Ambulatory Visit: Payer: Self-pay

## 2018-09-29 ENCOUNTER — Ambulatory Visit
Admission: RE | Admit: 2018-09-29 | Discharge: 2018-09-29 | Disposition: A | Discharge status: Home / Self Care | Payer: BLUE CROSS/BLUE SHIELD | Attending: Vascular Surgery | Admitting: Vascular Surgery

## 2018-09-29 DIAGNOSIS — E7800 Pure hypercholesterolemia, unspecified: Secondary | ICD-10-CM | POA: Insufficient documentation

## 2018-09-29 DIAGNOSIS — M1990 Unspecified osteoarthritis, unspecified site: Secondary | ICD-10-CM | POA: Insufficient documentation

## 2018-09-29 DIAGNOSIS — Z8249 Family history of ischemic heart disease and other diseases of the circulatory system: Secondary | ICD-10-CM | POA: Insufficient documentation

## 2018-09-29 DIAGNOSIS — Z79899 Other long term (current) drug therapy: Secondary | ICD-10-CM | POA: Diagnosis not present

## 2018-09-29 DIAGNOSIS — E119 Type 2 diabetes mellitus without complications: Secondary | ICD-10-CM | POA: Insufficient documentation

## 2018-09-29 DIAGNOSIS — I70223 Atherosclerosis of native arteries of extremities with rest pain, bilateral legs: Secondary | ICD-10-CM | POA: Diagnosis not present

## 2018-09-29 DIAGNOSIS — I2510 Atherosclerotic heart disease of native coronary artery without angina pectoris: Secondary | ICD-10-CM | POA: Insufficient documentation

## 2018-09-29 DIAGNOSIS — Z833 Family history of diabetes mellitus: Secondary | ICD-10-CM | POA: Insufficient documentation

## 2018-09-29 DIAGNOSIS — I10 Essential (primary) hypertension: Secondary | ICD-10-CM | POA: Diagnosis not present

## 2018-09-29 DIAGNOSIS — I70212 Atherosclerosis of native arteries of extremities with intermittent claudication, left leg: Secondary | ICD-10-CM | POA: Diagnosis not present

## 2018-09-29 DIAGNOSIS — J449 Chronic obstructive pulmonary disease, unspecified: Secondary | ICD-10-CM | POA: Diagnosis not present

## 2018-09-29 DIAGNOSIS — Z7982 Long term (current) use of aspirin: Secondary | ICD-10-CM | POA: Diagnosis not present

## 2018-09-29 DIAGNOSIS — Z7984 Long term (current) use of oral hypoglycemic drugs: Secondary | ICD-10-CM | POA: Diagnosis not present

## 2018-09-29 DIAGNOSIS — Z87891 Personal history of nicotine dependence: Secondary | ICD-10-CM | POA: Insufficient documentation

## 2018-09-29 DIAGNOSIS — Z9981 Dependence on supplemental oxygen: Secondary | ICD-10-CM | POA: Diagnosis not present

## 2018-09-29 DIAGNOSIS — Z7902 Long term (current) use of antithrombotics/antiplatelets: Secondary | ICD-10-CM | POA: Insufficient documentation

## 2018-09-29 DIAGNOSIS — I70219 Atherosclerosis of native arteries of extremities with intermittent claudication, unspecified extremity: Secondary | ICD-10-CM

## 2018-09-29 HISTORY — PX: LOWER EXTREMITY ANGIOGRAPHY: CATH118251

## 2018-09-29 LAB — GLUCOSE, CAPILLARY: Glucose-Capillary: 127 mg/dL — ABNORMAL HIGH (ref 70–99)

## 2018-09-29 SURGERY — LOWER EXTREMITY ANGIOGRAPHY
Anesthesia: Moderate Sedation | Site: Leg Lower | Laterality: Left

## 2018-09-29 MED ORDER — LABETALOL HCL 5 MG/ML IV SOLN: 10.0000 mg | INTRAVENOUS | Status: DC | PRN

## 2018-09-29 MED ORDER — LIDOCAINE-EPINEPHRINE (PF) 1 %-1:200000 IJ SOLN
INTRAMUSCULAR | Status: AC
  Filled 2018-09-29: qty 30

## 2018-09-29 MED ORDER — HEPARIN SODIUM (PORCINE) 1000 UNIT/ML IJ SOLN
INTRAMUSCULAR | Status: DC | PRN
  Administered 2018-09-29: 4000 [IU] via INTRAVENOUS

## 2018-09-29 MED ORDER — FENTANYL CITRATE (PF) 100 MCG/2ML IJ SOLN
INTRAMUSCULAR | Status: AC
  Filled 2018-09-29: qty 2

## 2018-09-29 MED ORDER — FAMOTIDINE 20 MG PO TABS: 40.0000 mg | ORAL_TABLET | Freq: Once | ORAL | Status: DC | PRN

## 2018-09-29 MED ORDER — MIDAZOLAM HCL 2 MG/2ML IJ SOLN
INTRAMUSCULAR | Status: DC | PRN
  Administered 2018-09-29 (×2): 1 mg via INTRAVENOUS
  Administered 2018-09-29: 2 mg via INTRAVENOUS

## 2018-09-29 MED ORDER — FENTANYL CITRATE (PF) 100 MCG/2ML IJ SOLN
INTRAMUSCULAR | Status: DC | PRN
  Administered 2018-09-29: 50 ug via INTRAVENOUS
  Administered 2018-09-29 (×2): 25 ug via INTRAVENOUS

## 2018-09-29 MED ORDER — HEPARIN (PORCINE) IN NACL 1000-0.9 UT/500ML-% IV SOLN
INTRAVENOUS | Status: AC
  Filled 2018-09-29: qty 1000

## 2018-09-29 MED ORDER — HEPARIN SODIUM (PORCINE) 1000 UNIT/ML IJ SOLN
INTRAMUSCULAR | Status: AC
  Filled 2018-09-29: qty 1

## 2018-09-29 MED ORDER — HYDROMORPHONE HCL 1 MG/ML IJ SOLN: 1.0000 mg | Freq: Once | INTRAMUSCULAR | Status: DC | PRN

## 2018-09-29 MED ORDER — SODIUM CHLORIDE 0.9 % IV SOLN: INTRAVENOUS | Status: DC

## 2018-09-29 MED ORDER — MIDAZOLAM HCL 2 MG/ML PO SYRP: 8.0000 mg | ORAL_SOLUTION | Freq: Once | ORAL | Status: DC | PRN

## 2018-09-29 MED ORDER — ONDANSETRON HCL 4 MG/2ML IJ SOLN: 4.0000 mg | Freq: Four times a day (QID) | INTRAMUSCULAR | Status: DC | PRN

## 2018-09-29 MED ORDER — SODIUM CHLORIDE 0.9% FLUSH: 3.0000 mL | INTRAVENOUS | Status: DC | PRN

## 2018-09-29 MED ORDER — SODIUM CHLORIDE 0.9 % IV SOLN
INTRAVENOUS | Status: DC
  Administered 2018-09-29: 1000 mL via INTRAVENOUS

## 2018-09-29 MED ORDER — HYDRALAZINE HCL 20 MG/ML IJ SOLN: 5.0000 mg | INTRAMUSCULAR | Status: DC | PRN

## 2018-09-29 MED ORDER — METHYLPREDNISOLONE SODIUM SUCC 125 MG IJ SOLR: 125.0000 mg | Freq: Once | INTRAMUSCULAR | Status: DC | PRN

## 2018-09-29 MED ORDER — ACETAMINOPHEN 325 MG PO TABS: 650.0000 mg | ORAL_TABLET | ORAL | Status: DC | PRN

## 2018-09-29 MED ORDER — IOPAMIDOL (ISOVUE-300) INJECTION 61%
INTRAVENOUS | Status: DC | PRN
  Administered 2018-09-29: 80 mL via INTRA_ARTERIAL

## 2018-09-29 MED ORDER — DIPHENHYDRAMINE HCL 50 MG/ML IJ SOLN: 50.0000 mg | Freq: Once | INTRAMUSCULAR | Status: DC | PRN

## 2018-09-29 MED ORDER — SODIUM CHLORIDE 0.9% FLUSH: 3.0000 mL | Freq: Two times a day (BID) | INTRAVENOUS | Status: DC

## 2018-09-29 MED ORDER — MIDAZOLAM HCL 5 MG/5ML IJ SOLN
INTRAMUSCULAR | Status: AC
  Filled 2018-09-29: qty 5

## 2018-09-29 MED ORDER — SODIUM CHLORIDE 0.9 % IV SOLN: 250.0000 mL | INTRAVENOUS | Status: DC | PRN

## 2018-09-29 SURGICAL SUPPLY — 20 items
BALLN DORADO 5X200X135 (BALLOONS) ×2
BALLN LUTONIX 018 5X220X130 (BALLOONS) ×4
BALLOON DORADO 5X200X135 (BALLOONS) ×1 IMPLANT
BALLOON LUTONIX 018 5X220X130 (BALLOONS) ×2 IMPLANT
CATH BEACON 5 .035 65 KMP TIP (CATHETERS) ×2 IMPLANT
CATH BEACON 5 .038 100 VERT TP (CATHETERS) ×2 IMPLANT
CATH CXI 4F 90 DAV (CATHETERS) ×2 IMPLANT
CATH PIG 70CM (CATHETERS) ×2 IMPLANT
DEVICE PRESTO INFLATION (MISCELLANEOUS) ×2 IMPLANT
DEVICE STARCLOSE SE CLOSURE (Vascular Products) ×2 IMPLANT
DEVICE TORQUE .025-.038 (MISCELLANEOUS) ×2 IMPLANT
GLIDEWIRE ADV .035X260CM (WIRE) ×2 IMPLANT
PACK ANGIOGRAPHY (CUSTOM PROCEDURE TRAY) ×2 IMPLANT
SHEATH ANL2 6FRX45 HC (SHEATH) ×2 IMPLANT
SHEATH BRITE TIP 5FRX11 (SHEATH) ×2 IMPLANT
STENT VIABAHN 6X100X120 (Permanent Stent) ×2 IMPLANT
STENT VIABAHN 6X250X120 (Permanent Stent) ×2 IMPLANT
TUBING CONTRAST HIGH PRESS 72 (TUBING) ×2 IMPLANT
WIRE G V18X300CM (WIRE) ×2 IMPLANT
WIRE J 3MM .035X145CM (WIRE) ×2 IMPLANT

## 2018-09-29 NOTE — Op Note (Signed)
[facility]  Percutaneous Study/Intervention Procedural Note   Date of Surgery: 09/29/2018  Surgeon(s):,    Assistants:none  Pre-operative Diagnosis: PAD with claudication left lower extremity  Post-operative diagnosis:  Same  Procedure(s) Performed:             1.  Ultrasound guidance for vascular access right femoral artery             2.  Catheter placement into left common femoral artery from right femoral approach             3.  Aortogram and selective left lower extremity angiogram             4.  Percutaneous transluminal angioplasty of higher left SFA and proximal popliteal artery with two 5 mm diameter by 22 cm length Lutonix drug-coated angioplasty balloons             5.   Viabahn stent placement x2 to the left SFA and proximal popliteal artery for greater than 50% stenosis after angioplasty both proximally and distally.  The stents were 6 mm in diameter by 25 cm in length and 6 mm in diameter by 10 cm in length  6.   StarClose closure device right femoral artery  EBL: 10 cc  Contrast: 80 cc  Fluoro Time: 13.4 minutes  Moderate Conscious Sedation Time: approximately 45 minutes using 4 mg of Versed and 100 Mcg of Fentanyl              Indications:  Patient is a 63 y.o.female with disabling claudication symptoms of the left leg status post previous intervention. The patient has noninvasive study showing reduced ABI of 0.5-0.6 range. The patient is brought in for angiography for further evaluation and potential treatment.  Risks and benefits are discussed and informed consent is obtained.   Procedure:  The patient was identified and appropriate procedural time out was performed.  The patient was then placed supine on the table and prepped and draped in the usual sterile fashion. Moderate conscious sedation was administered during a face to face encounter with the patient throughout the procedure with my supervision of the RN administering  medicines and monitoring the patient's vital signs, pulse oximetry, telemetry and mental status throughout from the start of the procedure until the patient was taken to the recovery room. Ultrasound was used to evaluate the right common femoral artery.  It was patent .  A digital ultrasound image was acquired.  A Seldinger needle was used to access the right common femoral artery under direct ultrasound guidance and a permanent image was performed.  A 0.035 J wire was advanced without resistance and a 5Fr sheath was placed.  Pigtail catheter was placed into the aorta and an AP aortogram was performed. This demonstrated normal renal arteries and normal aorta and iliac segments without significant stenosis. I then crossed the aortic bifurcation and advanced to the left femoral head. Selective left lower extremity angiogram was then performed. This demonstrated  a flush occlusion of the left SFA with 2 previously placed bare-metal stents that appeared to either be smart stents or Zilver stents.  There was reconstitution of the above-knee popliteal artery and then three-vessel runoff distally. It was felt that it was in the patient's best interest to proceed with intervention after these images to avoid a second procedure and a larger amount of contrast and fluoroscopy based off of the findings from the initial angiogram. The patient was systemically heparinized and a 6 [place]  Ansell sheath was then placed over the [employer] wire. I then used a Kumpe catheter and the advantage wire to navigate through the occluded stents throughout the SFA and confirm intraluminal flow in the popliteal artery just above the knee.  I then placed a 0.018 wire.  Angioplasty was then performed to the entire left SFA from the common femoral artery down to the above-knee popliteal artery to encompass the occlusion.  This required to Lutonix 5 mm diameter by 22 cm length angioplasty balloons and these were inflated to 10 to 12 atm for  1 minute.  Completion imaging showed high-grade residual stenosis and wall at the bottom of the previously placed stents in the popliteal artery as well as at the leading edge of the stent at the proximal SFA.  I elected to realign the area with covered stents.  A 6 mm diameter by 25 cm length stent was deployed from the above-knee popliteal artery up to the proximal to mid SFA.  A 6 mm diameter by 10 cm length Viabahn stent was then deployed from the origin of the SFA down to the just placed Viabahn stent.  Is were postdilated with 5 mm diameter high-pressure angioplasty balloon with less than 10% residual stenosis and excellent angiographic completion result. I elected to terminate the procedure. The sheath was removed and StarClose closure device was deployed in the right femoral artery with excellent hemostatic result. The patient was taken to the recovery room in stable condition having tolerated the procedure well.  Findings:               Aortogram:  Normal renal arteries.  Normal aorta.  The iliac arteries were mildly irregular but not significantly stenotic.             Left lower Extremity:  There was a flush occlusion of the left SFA with 2 previously placed bare-metal stents that appeared to either be smart stents or Zilver stents.  There was reconstitution of the above-knee popliteal artery and then three-vessel runoff distally.   Disposition: Patient was taken to the recovery room in stable condition having tolerated the procedure well.  Complications: None  [name] 09/29/2018 10:01 AM   This note was created with Dragon Medical transcription system. Any errors in dictation are purely unintentional.

## 2018-09-29 NOTE — H&P (Signed)
[facility] History & Physical Update  The patient was interviewed and re-examined.  The patient's previous History and Physical has been reviewed and is unchanged.  There is no change in the plan of care. We plan to proceed with the scheduled procedure.  [name], MD  09/29/2018, 8:49 AM

## 2018-10-18 ENCOUNTER — Telehealth: Payer: Self-pay

## 2018-10-18 NOTE — Telephone Encounter (Signed)
Advised pt that she will need to be seen, but pt does not want to come in.

## 2018-10-18 NOTE — Telephone Encounter (Signed)
She probably should be seen if possible.

## 2018-10-26 ENCOUNTER — Other Ambulatory Visit (INDEPENDENT_AMBULATORY_CARE_PROVIDER_SITE_OTHER): Payer: Self-pay | Admitting: Vascular Surgery

## 2018-10-26 ENCOUNTER — Other Ambulatory Visit: Payer: Self-pay

## 2018-10-26 DIAGNOSIS — I70212 Atherosclerosis of native arteries of extremities with intermittent claudication, left leg: Secondary | ICD-10-CM

## 2018-10-26 DIAGNOSIS — Z95820 Peripheral vascular angioplasty status with implants and grafts: Secondary | ICD-10-CM

## 2018-10-26 MED ORDER — ROPINIROLE HCL 0.5 MG PO TABS: 0.5000 mg | ORAL_TABLET | Freq: Two times a day (BID) | ORAL | 0 refills | Status: DC

## 2018-10-28 ENCOUNTER — Ambulatory Visit (INDEPENDENT_AMBULATORY_CARE_PROVIDER_SITE_OTHER): Payer: BLUE CROSS/BLUE SHIELD | Admitting: Vascular Surgery

## 2018-10-28 ENCOUNTER — Other Ambulatory Visit: Payer: Self-pay

## 2018-10-28 ENCOUNTER — Encounter (INDEPENDENT_AMBULATORY_CARE_PROVIDER_SITE_OTHER): Payer: Self-pay | Admitting: Vascular Surgery

## 2018-10-28 ENCOUNTER — Ambulatory Visit (INDEPENDENT_AMBULATORY_CARE_PROVIDER_SITE_OTHER): Payer: BLUE CROSS/BLUE SHIELD

## 2018-10-28 VITALS — BP 120/80 | HR 99 | Resp 10 | Ht 59.0 in | Wt 112.0 lb

## 2018-10-28 DIAGNOSIS — I70212 Atherosclerosis of native arteries of extremities with intermittent claudication, left leg: Secondary | ICD-10-CM | POA: Diagnosis not present

## 2018-10-28 DIAGNOSIS — E1151 Type 2 diabetes mellitus with diabetic peripheral angiopathy without gangrene: Secondary | ICD-10-CM

## 2018-10-28 DIAGNOSIS — I10 Essential (primary) hypertension: Secondary | ICD-10-CM | POA: Diagnosis not present

## 2018-10-28 DIAGNOSIS — E785 Hyperlipidemia, unspecified: Secondary | ICD-10-CM

## 2018-10-28 DIAGNOSIS — Z95820 Peripheral vascular angioplasty status with implants and grafts: Secondary | ICD-10-CM

## 2018-10-28 DIAGNOSIS — Z79899 Other long term (current) drug therapy: Secondary | ICD-10-CM

## 2018-10-28 DIAGNOSIS — I739 Peripheral vascular disease, unspecified: Secondary | ICD-10-CM

## 2018-10-28 DIAGNOSIS — Z87891 Personal history of nicotine dependence: Secondary | ICD-10-CM

## 2018-10-28 NOTE — Progress Notes (Signed)
MRN : [id number]  Angela Jensen is a 63 y.o. (1955-08-25) female who presents with chief complaint of  Chief Complaint  Patient presents with  . Follow-up  .  History of Present Illness: Patient returns today in follow up of her PAD.  She is doing reasonably well after revascularization about a month ago.  She has had some reperfusion swelling on the left leg that is gradually improving.  She has some soreness in her left thigh after opening her SFA occlusion.  Her noninvasive studies show a marked improvement in her left leg with ABIs that are 0.86.  Stable right ABI of 0.92.  Brisk triphasic waveforms and good digital pressures bilaterally.  Previous ABIs were 1.0 on the right and 0.63 on the left.  Current Outpatient Medications  Medication Sig Dispense Refill  . ACCU-CHEK FASTCLIX LANCETS MISC Use as directed twice a day diag E11.65 100 each 1  . albuterol (PROVENTIL) (2.5 MG/3ML) 0.083% nebulizer solution Take 3 mLs (2.5 mg total) by nebulization every 6 (six) hours as needed for wheezing. 75 mL 3  . amLODipine (NORVASC) 10 MG tablet Take 1 tablet (10 mg total) by mouth every morning. 30 tablet 5  . aspirin EC 81 MG tablet Take 1 tablet (81 mg total) by mouth daily. 150 tablet 2  . buPROPion (WELLBUTRIN XL) 150 MG 24 hr tablet TAKE 1 TABLET BY MOUTH TWICE DAILY FOR SMOKING CESSATION 60 tablet 6  . clopidogrel (PLAVIX) 75 MG tablet Take 1 tablet (75 mg total) by mouth daily. 30 tablet 11  . gabapentin (NEURONTIN) 100 MG capsule Take 1 capsule (100 mg total) by mouth 2 (two) times daily. 60 capsule 5  . glucose blood (ACCU-CHEK AVIVA PLUS) test strip Use as instructed to check blood sugars three times daily.  E11.65 250 each 5  . lisinopril-hydrochlorothiazide (PRINZIDE,ZESTORETIC) 10-12.5 MG tablet Take 1 tablet by mouth every morning. 30 tablet 5  . meloxicam (MOBIC) 7.5 MG tablet Take 1 tablet (7.5 mg total) by mouth 2 (two) times daily. 60 tablet 3  . metFORMIN (GLUCOPHAGE-XR) 500 MG  24 hr tablet Take 1 tablet (500 mg total) by mouth daily with breakfast. 30 tablet 3  . mometasone-formoterol (DULERA) 200-5 MCG/ACT AERO Inhale 1 puff into the lungs 2 (two) times daily. 1 Inhaler 5  . OXYGEN Inhale 2 L into the lungs at bedtime.    . potassium chloride (K-DUR) 10 MEQ tablet TAKE 1 TABLET BY MOUTH EVERY OTHER DAY FOR  LOW  POTASSIUM 15 tablet 2  . PROAIR HFA 108 (90 Base) MCG/ACT inhaler INHALE 2 PUFFS INTO LUNGS EVERY 4 TO 6 HOURS AS NEEDED FOR SHORTNESS OF BREATH 9 each 6  . rOPINIRole (REQUIP) 0.5 MG tablet Take 1 tablet (0.5 mg total) by mouth 2 (two) times daily. 180 tablet 0  . rosuvastatin (CRESTOR) 10 MG tablet Take 1 tablet (10 mg total) by mouth at bedtime. 30 tablet 5  . sitaGLIPtin (JANUVIA) 25 MG tablet Take 1 tablet (25 mg total) by mouth daily. 30 tablet 3  . SPIRIVA HANDIHALER 18 MCG inhalation capsule INHALE THE CONTENTS OF 1 CAPSULE DAILY 90 capsule 0   No current facility-administered medications for this visit.     Past Medical History:  Diagnosis Date  . Arthritis   . Asthma   . Atherosclerosis of native arteries of extremity with intermittent claudication ([place]) 05/26/2016  . Cancer [facility]) 2012   Right Lung CA  . COPD (chronic obstructive pulmonary disease) ([place])   .  Depression   . Diabetes mellitus without complication [facility])    Patient takes Janumet  . Essential hypertension 05/26/2016  . Hypercholesteremia   . Hypertension   . 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 PAD (peripheral artery disease) ([place]) 06/22/2016  . Peripheral vascular disease ([place])   . Personal history of radiation therapy   . Shortness of breath dyspnea    with exertion   . Sleep apnea     Past Surgical History:  Procedure Laterality Date  . CESAREAN SECTION     x3  . COLONOSCOPY WITH PROPOFOL N/A 06/25/2015   Procedure: COLONOSCOPY WITH PROPOFOL;  Surgeon: Lucilla Lame, MD;  Location: [facility] ENDOSCOPY;  Service: Endoscopy;  Laterality: N/A;  . CYST REMOVAL LEG     and on  shoulder   . LOWER EXTREMITY ANGIOGRAPHY Left 09/29/2018   Procedure: LOWER EXTREMITY ANGIOGRAPHY;  Surgeon: Algernon Huxley, MD;  Location: [place] [facility];  Service: Cardiovascular;  Laterality: Left;  . LUNG BIOPSY  05 15 2013   has lung "spots"  . PERIPHERAL VASCULAR CATHETERIZATION Left 06/01/2016   Procedure: Lower Extremity Angiography;  Surgeon: Algernon Huxley, MD;  Location: [place] [facility];  Service: Cardiovascular;  Laterality: Left;  . PERIPHERAL VASCULAR CATHETERIZATION N/A 06/01/2016   Procedure: Abdominal Aortogram w/Lower Extremity;  Surgeon: Algernon Huxley, MD;  Location: [place] [facility];  Service: Cardiovascular;  Laterality: N/A;  . PERIPHERAL VASCULAR CATHETERIZATION  06/01/2016   Procedure: Lower Extremity Intervention;  Surgeon: Algernon Huxley, MD;  Location: [place] [facility];  Service: Cardiovascular;;  . PERIPHERAL VASCULAR CATHETERIZATION Right 06/08/2016   Procedure: Lower Extremity Angiography;  Surgeon: Algernon Huxley, MD;  Location: [place] [facility];  Service: Cardiovascular;  Laterality: Right;  . PERIPHERAL VASCULAR CATHETERIZATION  06/08/2016   Procedure: Lower Extremity Intervention;  Surgeon: Algernon Huxley, MD;  Location: [place] [facility];  Service: Cardiovascular;;   Family History  Problem Relation Age of Onset  . Diabetes Mother   . Lung cancer Father   . Diabetes Sister   . Diabetes Maternal Grandmother   . Diabetes Paternal Grandmother   . Diabetes Sister      Social History       Social History  Substance Use Topics  . Smoking status: Former Smoker    Packs/day: 1.00    Years: 30.00    Types: Cigarettes    Quit date: 02/06/2010  . Smokeless tobacco: Former Systems developer    Types: Snuff  . Alcohol use No     Comment: hx of etoh use   No IV drug use  No Known Allergies     REVIEW OF SYSTEMS(Negative unless checked)  Constitutional: [] ?Weight loss[] ?Fever[] ?Chills Cardiac:[] ?Chest  pain[] ?Chest pressure[] ?Palpitations [] ?Shortness of breath when laying flat [] ?Shortness of breath at rest [] ?Shortness of breath with exertion. Vascular: [x] ?Pain in legs with walking[] ?Pain in legsat rest[] ?Pain in legs when laying flat [x] ?Claudication [] ?Pain in feet when walking [] ?Pain in feet at rest [] ?Pain in feet when laying flat [] ?History of DVT [] ?Phlebitis [] ?Swelling in legs [] ?Varicose veins [] ?Non-healing ulcers Pulmonary: [] ?Uses home oxygen [] ?Productive cough[] ?Hemoptysis [] ?Wheeze [x] ?COPD [] ?Asthma Neurologic: [] ?Dizziness [] ?Blackouts [] ?Seizures [] ?History of stroke [] ?History of TIA[] ?Aphasia [] ?Temporary blindness[] ?Dysphagia [] ?Weaknessor numbness in arms [] ?Weakness or numbnessin legs Musculoskeletal: [] ?Arthritis [] ?Joint swelling [] ?Joint pain [] ?Low back pain Hematologic:[] ?Easy bruising[] ?Easy bleeding [] ?Hypercoagulable state [] ?Anemic [] ?Hepatitis Gastrointestinal:[] ?Blood in stool[] ?Vomiting blood[] ?Gastroesophageal reflux/heartburn[] ?Abdominal pain Genitourinary: [] ?Chronic kidney disease [] ?Difficulturination [] ?Frequenturination [] ?Burning with urination[] ?Hematuria Skin: [] ?Rashes [] ?Ulcers [] ?Wounds Psychological: [] ?History of anxiety[] ?History  of major depression.    Physical Examination  BP 120/80 (BP Location: Left Arm, Patient Position: Sitting, Cuff Size: Small)   Pulse 99   Resp 10   Ht 4\' 11"  (1.499 m)   Wt 112 lb (50.8 kg)   BMI 22.62 kg/m  Gen:  WD/WN, NAD Head: [place]/AT, No temporalis wasting. Ear/Nose/Throat: Hearing grossly intact, nares w/o erythema or drainage Eyes: Conjunctiva clear. Sclera non-icteric Neck: Supple.  Trachea midline Pulmonary:  Good air movement, no use of accessory muscles.  Cardiac: RRR, no JVD Vascular:  Vessel Right Left  Radial Palpable Palpable                          PT Palpable  1+  palpable  DP Palpable Palpable   Gastrointestinal: soft, non-tender/non-distended. No guarding/reflex.  Musculoskeletal: M/S 5/5 throughout.  No deformity or atrophy.  Mild left lower extremity edema. Neurologic: Sensation grossly intact in extremities.  Symmetrical.  Speech is fluent.  Psychiatric: Judgment intact, Mood & affect appropriate for pt's clinical situation. Dermatologic: No rashes or ulcers noted.  No cellulitis or open wounds.       Labs Recent Results (from the past 2160 hour(s))  POCT HgB A1C     Status: Abnormal   Collection Time: 08/18/18  9:57 AM  Result Value Ref Range   Hemoglobin A1C 5.9 (A) 4.0 - 5.6 %   HbA1c POC (<> result, manual entry)     HbA1c, POC (prediabetic range)     HbA1c, POC (controlled diabetic range)    BUN     Status: None   Collection Time: 09/27/18 10:43 AM  Result Value Ref Range   BUN 13 8 - 23 mg/dL    Comment: Performed at [facility], [address] [place], [place] [address]  Creatinine, serum     Status: None   Collection Time: 09/27/18 10:43 AM  Result Value Ref Range   Creatinine, Ser 0.68 0.44 - 1.00 mg/dL   GFR calc non Af Amer >60 >60 mL/min   GFR calc Af Amer >60 >60 mL/min    Comment: Performed at [facility], [address] [place], [place] [address]  Glucose, capillary     Status: Abnormal   Collection Time: 09/29/18 11:01 AM  Result Value Ref Range   Glucose-Capillary 127 (H) 70 - 99 mg/dL    Radiology No results found.  Assessment/Plan Essential hypertension blood pressure control important in reducing the progression of atherosclerotic disease. On appropriate oral medications.   Diabetes ([place]) blood glucose control important in reducing the progression of atherosclerotic disease. Also, involved in wound healing. On appropriate medications.   Hyperlipidemia lipid control important in reducing the progression of atherosclerotic disease. Continue statin therapy  PAD  (peripheral artery disease) ([place]) Her noninvasive studies show a marked improvement in her left leg with ABIs that are 0.86.  Stable right ABI of 0.92.  Brisk triphasic waveforms and good digital pressures bilaterally.  Previous ABIs were 1.0 on the right and 0.63 on the left. Doing well.  Continue current medical regimen.  Plan to recheck again in 3 to 4 months with noninvasive studies    [name], MD  10/28/2018 4:22 PM    This note was created with Dragon medical transcription system.  Any errors from dictation are purely unintentional

## 2018-10-28 NOTE — Assessment & Plan Note (Signed)
Her noninvasive studies show a marked improvement in her left leg with ABIs that are 0.86.  Stable right ABI of 0.92.  Brisk triphasic waveforms and good digital pressures bilaterally.  Previous ABIs were 1.0 on the right and 0.63 on the left. Doing well.  Continue current medical regimen.  Plan to recheck again in 3 to 4 months with noninvasive studies

## 2018-11-07 ENCOUNTER — Ambulatory Visit: Payer: Self-pay | Admitting: Internal Medicine

## 2018-11-08 ENCOUNTER — Other Ambulatory Visit: Payer: Self-pay

## 2018-11-08 MED ORDER — AMLODIPINE BESYLATE 10 MG PO TABS: 10.0000 mg | ORAL_TABLET | Freq: Every morning | ORAL | 5 refills | Status: DC

## 2018-11-08 MED ORDER — MOMETASONE FURO-FORMOTEROL FUM 200-5 MCG/ACT IN AERO: 1.0000 | INHALATION_SPRAY | Freq: Two times a day (BID) | RESPIRATORY_TRACT | 5 refills | Status: DC

## 2018-11-17 ENCOUNTER — Other Ambulatory Visit: Payer: Self-pay

## 2018-11-17 ENCOUNTER — Ambulatory Visit: Payer: BLUE CROSS/BLUE SHIELD | Admitting: Adult Health

## 2018-11-17 ENCOUNTER — Other Ambulatory Visit: Payer: Self-pay | Admitting: Adult Health

## 2018-11-17 ENCOUNTER — Encounter: Payer: Self-pay | Admitting: Adult Health

## 2018-11-17 ENCOUNTER — Ambulatory Visit: Payer: Self-pay | Admitting: Nurse Practitioner

## 2018-11-17 VITALS — BP 138/90 | HR 120 | Resp 16 | Ht 59.0 in | Wt 111.0 lb

## 2018-11-17 DIAGNOSIS — J449 Chronic obstructive pulmonary disease, unspecified: Secondary | ICD-10-CM | POA: Diagnosis not present

## 2018-11-17 DIAGNOSIS — E119 Type 2 diabetes mellitus without complications: Secondary | ICD-10-CM | POA: Diagnosis not present

## 2018-11-17 DIAGNOSIS — I10 Essential (primary) hypertension: Secondary | ICD-10-CM

## 2018-11-17 DIAGNOSIS — E1165 Type 2 diabetes mellitus with hyperglycemia: Secondary | ICD-10-CM

## 2018-11-17 MED ORDER — TIOTROPIUM BROMIDE MONOHYDRATE 18 MCG IN CAPS: 1.0000 | ORAL_CAPSULE | Freq: Every day | RESPIRATORY_TRACT | 2 refills | Status: DC

## 2018-11-17 MED ORDER — POTASSIUM CHLORIDE ER 10 MEQ PO TBCR: EXTENDED_RELEASE_TABLET | ORAL | 2 refills | Status: DC

## 2018-11-17 MED ORDER — LISINOPRIL-HYDROCHLOROTHIAZIDE 10-12.5 MG PO TABS: 1.0000 | ORAL_TABLET | Freq: Every morning | ORAL | 5 refills | Status: DC

## 2018-11-17 MED ORDER — SITAGLIPTIN PHOSPHATE 25 MG PO TABS: 25.0000 mg | ORAL_TABLET | Freq: Every day | ORAL | 3 refills | Status: DC

## 2018-11-17 NOTE — Patient Instructions (Signed)
Diabetes Mellitus and Nutrition, Adult  When you have diabetes (diabetes mellitus), it is very important to have healthy eating habits because your blood sugar (glucose) levels are greatly affected by what you eat and drink. Eating healthy foods in the appropriate amounts, at about the same times every day, can help you:  · Control your blood glucose.  · Lower your risk of heart disease.  · Improve your blood pressure.  · Reach or maintain a healthy weight.  Every person with diabetes is different, and each person has different needs for a meal plan. Your health care provider may recommend that you work with a diet and nutrition specialist (dietitian) to make a meal plan that is best for you. Your meal plan may vary depending on factors such as:  · The calories you need.  · The medicines you take.  · Your weight.  · Your blood glucose, blood pressure, and cholesterol levels.  · Your activity level.  · Other health conditions you have, such as heart or kidney disease.  How do carbohydrates affect me?  Carbohydrates, also called carbs, affect your blood glucose level more than any other type of food. Eating carbs naturally raises the amount of glucose in your blood. Carb counting is a method for keeping track of how many carbs you eat. Counting carbs is important to keep your blood glucose at a healthy level, especially if you use insulin or take certain oral diabetes medicines.  It is important to know how many carbs you can safely have in each meal. This is different for every person. Your dietitian can help you calculate how many carbs you should have at each meal and for each snack.  Foods that contain carbs include:  · Bread, cereal, rice, pasta, and crackers.  · Potatoes and corn.  · Peas, beans, and lentils.  · Milk and yogurt.  · Fruit and juice.  · Desserts, such as cakes, cookies, ice cream, and candy.  How does alcohol affect me?  Alcohol can cause a sudden decrease in blood glucose (hypoglycemia),  especially if you use insulin or take certain oral diabetes medicines. Hypoglycemia can be a life-threatening condition. Symptoms of hypoglycemia (sleepiness, dizziness, and confusion) are similar to symptoms of having too much alcohol.  If your health care provider says that alcohol is safe for you, follow these guidelines:  · Limit alcohol intake to no more than 1 drink per day for nonpregnant women and 2 drinks per day for men. One drink equals 12 oz of beer, 5 oz of wine, or 1½ oz of hard liquor.  · Do not drink on an empty stomach.  · Keep yourself hydrated with water, diet soda, or unsweetened iced tea.  · Keep in mind that regular soda, juice, and other mixers may contain a lot of sugar and must be counted as carbs.  What are tips for following this plan?    Reading food labels  · Start by checking the serving size on the "Nutrition Facts" label of packaged foods and drinks. The amount of calories, carbs, fats, and other nutrients listed on the label is based on one serving of the item. Many items contain more than one serving per package.  · Check the total grams (g) of carbs in one serving. You can calculate the number of servings of carbs in one serving by dividing the total carbs by 15. For example, if a food has 30 g of total carbs, it would be equal to 2   servings of carbs.  · Check the number of grams (g) of saturated and trans fats in one serving. Choose foods that have low or no amount of these fats.  · Check the number of milligrams (mg) of salt (sodium) in one serving. Most people should limit total sodium intake to less than 2,300 mg per day.  · Always check the nutrition information of foods labeled as "low-fat" or "nonfat". These foods may be higher in added sugar or refined carbs and should be avoided.  · Talk to your dietitian to identify your daily goals for nutrients listed on the label.  Shopping  · Avoid buying canned, premade, or processed foods. These foods tend to be high in fat, sodium,  and added sugar.  · Shop around the outside edge of the grocery store. This includes fresh fruits and vegetables, bulk grains, fresh meats, and fresh dairy.  Cooking  · Use low-heat cooking methods, such as baking, instead of high-heat cooking methods like deep frying.  · Cook using healthy oils, such as olive, canola, or sunflower oil.  · Avoid cooking with butter, cream, or high-fat meats.  Meal planning  · Eat meals and snacks regularly, preferably at the same times every day. Avoid going long periods of time without eating.  · Eat foods high in fiber, such as fresh fruits, vegetables, beans, and whole grains. Talk to your dietitian about how many servings of carbs you can eat at each meal.  · Eat 4-6 ounces (oz) of lean protein each day, such as lean meat, chicken, fish, eggs, or tofu. One oz of lean protein is equal to:  ? 1 oz of meat, chicken, or fish.  ? 1 egg.  ? ¼ cup of tofu.  · Eat some foods each day that contain healthy fats, such as avocado, nuts, seeds, and fish.  Lifestyle  · Check your blood glucose regularly.  · Exercise regularly as told by your health care provider. This may include:  ? 150 minutes of moderate-intensity or vigorous-intensity exercise each week. This could be brisk walking, biking, or water aerobics.  ? Stretching and doing strength exercises, such as yoga or weightlifting, at least 2 times a week.  · Take medicines as told by your health care provider.  · Do not use any products that contain nicotine or tobacco, such as cigarettes and e-cigarettes. If you need help quitting, ask your health care provider.  · Work with a counselor or diabetes educator to identify strategies to manage stress and any emotional and social challenges.  Questions to ask a health care provider  · Do I need to meet with a diabetes educator?  · Do I need to meet with a dietitian?  · What number can I call if I have questions?  · When are the best times to check my blood glucose?  Where to find more  information:  · American Diabetes Association: diabetes.org  · Academy of Nutrition and Dietetics: www.eatright.org  · National Institute of Diabetes and Digestive and Kidney Diseases (NIH): www.niddk.nih.gov  Summary  · A healthy meal plan will help you control your blood glucose and maintain a healthy lifestyle.  · Working with a diet and nutrition specialist (dietitian) can help you make a meal plan that is best for you.  · Keep in mind that carbohydrates (carbs) and alcohol have immediate effects on your blood glucose levels. It is important to count carbs and to use alcohol carefully.  This information is not intended to   replace advice given to you by your health care provider. Make sure you discuss any questions you have with your health care provider.  Document Released: 04/30/2005 Document Revised: 03/03/2017 Document Reviewed: 09/07/2016  Elsevier Interactive Patient Education © 2019 [employer]

## 2018-11-17 NOTE — Progress Notes (Signed)
[facility], [place] [address]  Internal MEDICINE  Telephone Visit  Patient Name: Angela Jensen  [id number]  [id number]  Date of Service: 11/17/2018  I connected with the patient at  1155 by telephone and verified the patients identity using two identifiers.   I discussed the limitations, risks, security and privacy concerns of performing an evaluation and management service by telephone and the availability of in person appointments. I also discussed with the patient that there may be a patient responsible charge related to the service.  The patient expressed understanding and agrees to proceed.    CC:  Follow up on Diabties, HTN, and medication refills. Pt also has cough.  HPI  Pt reports her blood sugar this morning is 159 mg/dl.  She reports her blood sugar has been decently controlled.  She reports it is typically in the 140's and 150's. Overall she is doing well and denies any issues except her need for medication refills.  Pt will need a HGA1c as this can not be done over the phone. Will schedule to see patient in 6 weeks. She is also complaining of cough.  She has a chronic cough however, she feels like it has gotten worse of the last two weeks.  She reports white/clear phlegm at times.  Denies fever, sob or other complaints.       Current Medication: Outpatient Encounter Medications as of 11/17/2018  Medication Sig  . ACCU-CHEK FASTCLIX LANCETS MISC Use as directed twice a day diag E11.65  . albuterol (PROVENTIL) (2.5 MG/3ML) 0.083% nebulizer solution Take 3 mLs (2.5 mg total) by nebulization every 6 (six) hours as needed for wheezing.  Marland Kitchen amLODipine (NORVASC) 10 MG tablet Take 1 tablet (10 mg total) by mouth every morning.  Marland Kitchen aspirin EC 81 MG tablet Take 1 tablet (81 mg total) by mouth daily.  Marland Kitchen buPROPion (WELLBUTRIN XL) 150 MG 24 hr tablet TAKE 1 TABLET BY MOUTH TWICE DAILY FOR SMOKING CESSATION  . clopidogrel (PLAVIX) 75 MG tablet Take 1 tablet (75  mg total) by mouth daily.  Marland Kitchen gabapentin (NEURONTIN) 100 MG capsule Take 1 capsule (100 mg total) by mouth 2 (two) times daily.  Marland Kitchen glucose blood (ACCU-CHEK AVIVA PLUS) test strip Use as instructed to check blood sugars three times daily.  E11.65  . lisinopril-hydrochlorothiazide (PRINZIDE,ZESTORETIC) 10-12.5 MG tablet Take 1 tablet by mouth every morning.  . meloxicam (MOBIC) 7.5 MG tablet Take 1 tablet (7.5 mg total) by mouth 2 (two) times daily.  . metFORMIN (GLUCOPHAGE-XR) 500 MG 24 hr tablet Take 1 tablet (500 mg total) by mouth daily with breakfast.  . mometasone-formoterol (DULERA) 200-5 MCG/ACT AERO Inhale 1 puff into the lungs 2 (two) times daily.  . OXYGEN Inhale 2 L into the lungs at bedtime.  . potassium chloride (K-DUR) 10 MEQ tablet TAKE 1 TABLET BY MOUTH EVERY OTHER DAY FOR  LOW  POTASSIUM  . PROAIR HFA 108 (90 Base) MCG/ACT inhaler INHALE 2 PUFFS INTO LUNGS EVERY 4 TO 6 HOURS AS NEEDED FOR SHORTNESS OF BREATH  . rOPINIRole (REQUIP) 0.5 MG tablet Take 1 tablet (0.5 mg total) by mouth 2 (two) times daily.  . rosuvastatin (CRESTOR) 10 MG tablet Take 1 tablet (10 mg total) by mouth at bedtime.  . sitaGLIPtin (JANUVIA) 25 MG tablet Take 1 tablet (25 mg total) by mouth daily.  Marland Kitchen tiotropium (SPIRIVA HANDIHALER) 18 MCG inhalation capsule Place 1 capsule (18 mcg total) into inhaler and inhale daily.  . [DISCONTINUED]  lisinopril-hydrochlorothiazide (PRINZIDE,ZESTORETIC) 10-12.5 MG tablet Take 1 tablet by mouth every morning.  . [DISCONTINUED] potassium chloride (K-DUR) 10 MEQ tablet TAKE 1 TABLET BY MOUTH EVERY OTHER DAY FOR  LOW  POTASSIUM  . [DISCONTINUED] SPIRIVA HANDIHALER 18 MCG inhalation capsule INHALE THE CONTENTS OF 1 CAPSULE DAILY   No facility-administered encounter medications on file as of 11/17/2018.     Surgical History: Past Surgical History:  Procedure Laterality Date  . CESAREAN SECTION     x3  . COLONOSCOPY WITH PROPOFOL N/A 06/25/2015   Procedure: COLONOSCOPY WITH  PROPOFOL;  Surgeon: Lucilla Lame, MD;  Location: [facility] ENDOSCOPY;  Service: Endoscopy;  Laterality: N/A;  . CYST REMOVAL LEG     and on shoulder   . LOWER EXTREMITY ANGIOGRAPHY Left 09/29/2018   Procedure: LOWER EXTREMITY ANGIOGRAPHY;  Surgeon: Algernon Huxley, MD;  Location: [place] [facility];  Service: Cardiovascular;  Laterality: Left;  . LUNG BIOPSY  05 15 2013   has lung "spots"  . PERIPHERAL VASCULAR CATHETERIZATION Left 06/01/2016   Procedure: Lower Extremity Angiography;  Surgeon: Algernon Huxley, MD;  Location: [place] [facility];  Service: Cardiovascular;  Laterality: Left;  . PERIPHERAL VASCULAR CATHETERIZATION N/A 06/01/2016   Procedure: Abdominal Aortogram w/Lower Extremity;  Surgeon: Algernon Huxley, MD;  Location: [place] [facility];  Service: Cardiovascular;  Laterality: N/A;  . PERIPHERAL VASCULAR CATHETERIZATION  06/01/2016   Procedure: Lower Extremity Intervention;  Surgeon: Algernon Huxley, MD;  Location: [place] [facility];  Service: Cardiovascular;;  . PERIPHERAL VASCULAR CATHETERIZATION Right 06/08/2016   Procedure: Lower Extremity Angiography;  Surgeon: Algernon Huxley, MD;  Location: [place] [facility];  Service: Cardiovascular;  Laterality: Right;  . PERIPHERAL VASCULAR CATHETERIZATION  06/08/2016   Procedure: Lower Extremity Intervention;  Surgeon: Algernon Huxley, MD;  Location: [place] [facility];  Service: Cardiovascular;;    Medical History: Past Medical History:  Diagnosis Date  . Arthritis   . Asthma   . Atherosclerosis of native arteries of extremity with intermittent claudication ([place]) 05/26/2016  . Cancer [facility]) 2012   Right Lung CA  . COPD (chronic obstructive pulmonary disease) ([place])   . Depression   . Diabetes mellitus without complication [facility])    Patient takes Janumet  . Essential hypertension 05/26/2016  . Hypercholesteremia   . Hypertension   . 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 PAD (peripheral artery disease) ([place]) 06/22/2016  . Peripheral vascular  disease ([place])   . Personal history of radiation therapy   . Shortness of breath dyspnea    with exertion   . Sleep apnea     Family History: Family History  Problem Relation Age of Onset  . Lung cancer Father   . Diabetes Mother   . Hypercholesterolemia Mother   . Diabetes Sister   . Diabetes Maternal Grandmother   . Diabetes Paternal Grandmother   . Diabetes Sister   . Heart attack Brother   . Coronary artery disease Brother   . Vascular Disease Brother   . Hypertension Sister   . Heart attack Brother     Social History   Socioeconomic History  . Marital status: Widowed    Spouse name: Not on file  . Number of children: Not on file  . Years of education: Not on file  . Highest education level: Not on file  Occupational History  . Not on file  Social Needs  . Financial resource strain: Not on file  .  Food insecurity:    Worry: Not on file    Inability: Not on file  . Transportation needs:    Medical: Not on file    Non-medical: Not on file  Tobacco Use  . Smoking status: Former Smoker    Packs/day: 1.00    Years: 37.00    Pack years: 37.00    Types: Cigarettes    Last attempt to quit: 02/06/2010    Years since quitting: 8.7  . Smokeless tobacco: Former Systems developer    Types: Snuff  Substance and Sexual Activity  . Alcohol use: Yes    Alcohol/week: 5.0 standard drinks    Types: 5 Cans of beer per week    Comment: /h x of alcohol abuse -stopped 2012- now drinks 5 beer per w  . Drug use: Not Currently    Types: Marijuana, "Crack" cocaine, Cocaine    Comment: hx of cocaine use- last use 2015; last use sat marijuana6/22/19,   . Sexual activity: Yes  Lifestyle  . Physical activity:    Days per week: Not on file    Minutes per session: Not on file  . Stress: Not on file  Relationships  . Social connections:    Talks on phone: Not on file    Gets together: Not on file    Attends religious service: Not on file    Active member of club or organization: Not on file     Attends meetings of clubs or organizations: Not on file    Relationship status: Not on file  . Intimate partner violence:    Fear of current or ex partner: Not on file    Emotionally abused: Not on file    Physically abused: Not on file    Forced sexual activity: Not on file  Other Topics Concern  . Not on file  Social History Narrative  . Not on file      Review of Systems  Constitutional: Negative for chills, fatigue and unexpected weight change.  HENT: Negative for congestion, rhinorrhea, sneezing and sore throat.   Eyes: Negative for photophobia, pain and redness.  Respiratory: Positive for cough. Negative for chest tightness and shortness of breath.   Cardiovascular: Negative for chest pain and palpitations.  Gastrointestinal: Negative for abdominal pain, constipation, diarrhea, nausea and vomiting.  Endocrine: Negative.   Genitourinary: Negative for dysuria and frequency.  Musculoskeletal: Negative for arthralgias, back pain, joint swelling and neck pain.  Skin: Negative for rash.  Allergic/Immunologic: Negative.   Neurological: Negative for tremors and numbness.  Hematological: Negative for adenopathy. Does not bruise/bleed easily.  Psychiatric/Behavioral: Negative for behavioral problems and sleep disturbance. The patient is not nervous/anxious.     Vital Signs: BP 138/90   Pulse (!) 120   Resp 16   Ht 4\' 11"  (1.499 m)   Wt 111 lb (50.3 kg)   BMI 22.42 kg/m    Observation/Objective: Pt reports worse cough, however sounds relatively dry over he phone.  She is not a smoker.  She is not taking anything for allergies.      Assessment/Plan: 1. Type 2 diabetes mellitus without complication, without long-term current use of insulin (HCC) Stable at this time.  PTs FSBG at home is in the 140-150 range in AM.  Denies any new symptoms.  Will continue current medications, and follow up in office in 6 weeks and   2. Essential hypertension BP appears stable.  Will  continue to monitor.  Refilled patients lisinopril. Pt has some tachycardia, she denies  any chest pain, sob or palpitations.  This could be from her home BP monitor.  Will recheck in office at next visit.  - lisinopril-hydrochlorothiazide (PRINZIDE,ZESTORETIC) 10-12.5 MG tablet; Take 1 tablet by mouth every morning.  Dispense: 30 tablet; Refill: 5 - potassium chloride (K-DUR) 10 MEQ tablet; TAKE 1 TABLET BY MOUTH EVERY OTHER DAY FOR  LOW  POTASSIUM  Dispense: 15 tablet; Refill: 2  3. Chronic obstructive pulmonary disease, unspecified COPD type ([place]) Refilled patients spiriva.  Encouraged compliance with medications.    - tiotropium (SPIRIVA HANDIHALER) 18 MCG inhalation capsule; Place 1 capsule (18 mcg total) into inhaler and inhale daily.  Dispense: 90 capsule; Refill: 2  General Counseling: Lory verbalizes understanding of the findings of today's phone visit and agrees with plan of treatment. I have discussed any further diagnostic evaluation that may be needed or ordered today. We also reviewed her medications today. she has been encouraged to call the office with any questions or concerns that should arise related to todays visit.    No orders of the defined types were placed in this encounter.   Meds ordered this encounter  Medications  . lisinopril-hydrochlorothiazide (PRINZIDE,ZESTORETIC) 10-12.5 MG tablet    Sig: Take 1 tablet by mouth every morning.    Dispense:  30 tablet    Refill:  5  . tiotropium (SPIRIVA HANDIHALER) 18 MCG inhalation capsule    Sig: Place 1 capsule (18 mcg total) into inhaler and inhale daily.    Dispense:  90 capsule    Refill:  2  . potassium chloride (K-DUR) 10 MEQ tablet    Sig: TAKE 1 TABLET BY MOUTH EVERY OTHER DAY FOR  LOW  POTASSIUM    Dispense:  15 tablet    Refill:  2    Please consider 90 day supplies to promote better adherence    Time spent: [place] AGNP-C Internal medicine

## 2018-11-17 NOTE — Progress Notes (Signed)
[facility] [place], [place] [address]  Internal MEDICINE  Telephone Visit  Patient Name: Angela Jensen  [id number]  [id number]  Date of Service: 01/12/2019  I connected with the patient at  1155 by telephone and verified the patients identity using two identifiers.   I discussed the limitations, risks, security and privacy concerns of performing an evaluation and management service by telephone and the availability of in person appointments. I also discussed with the patient that there may be a patient responsible charge related to the service.  The patient expressed understanding and agrees to proceed.    CC:  Follow up on Diabties, HTN, and medication refills. Pt also has cough.  HPI  Pt reports her blood sugar this morning is 159 mg/dl.  She reports her blood sugar has been decently controlled.  She reports it is typically in the 140's and 150's. Overall she is doing well and denies any issues except her need for medication refills.  Pt will need a HGA1c as this can not be done over the phone. Will schedule to see patient in 6 weeks. She is also complaining of cough.  She has a chronic cough however, she feels like it has gotten worse of the last two weeks.  She reports white/clear phlegm at times.  Denies fever, sob or other complaints.       Current Medication: Outpatient Encounter Medications as of 11/17/2018  Medication Sig  . ACCU-CHEK FASTCLIX LANCETS MISC Use as directed twice a day diag E11.65  . amLODipine (NORVASC) 10 MG tablet Take 1 tablet (10 mg total) by mouth every morning.  Marland Kitchen aspirin EC 81 MG tablet Take 1 tablet (81 mg total) by mouth daily.  Marland Kitchen buPROPion (WELLBUTRIN XL) 150 MG 24 hr tablet TAKE 1 TABLET BY MOUTH TWICE DAILY FOR SMOKING CESSATION  . clopidogrel (PLAVIX) 75 MG tablet Take 1 tablet (75 mg total) by mouth daily.  Marland Kitchen glucose blood (ACCU-CHEK AVIVA PLUS) test strip Use as instructed to check blood sugars three times daily.  E11.65  .  lisinopril-hydrochlorothiazide (PRINZIDE,ZESTORETIC) 10-12.5 MG tablet Take 1 tablet by mouth every morning.  . metFORMIN (GLUCOPHAGE-XR) 500 MG 24 hr tablet Take 1 tablet (500 mg total) by mouth daily with breakfast.  . OXYGEN Inhale 2 L into the lungs at bedtime.  . potassium chloride (K-DUR) 10 MEQ tablet TAKE 1 TABLET BY MOUTH EVERY OTHER DAY FOR  LOW  POTASSIUM  . rOPINIRole (REQUIP) 0.5 MG tablet Take 1 tablet (0.5 mg total) by mouth 2 (two) times daily.  . rosuvastatin (CRESTOR) 10 MG tablet Take 1 tablet (10 mg total) by mouth at bedtime.  Marland Kitchen tiotropium (SPIRIVA HANDIHALER) 18 MCG inhalation capsule Place 1 capsule (18 mcg total) into inhaler and inhale daily.  . [DISCONTINUED] albuterol (PROVENTIL) (2.5 MG/3ML) 0.083% nebulizer solution Take 3 mLs (2.5 mg total) by nebulization every 6 (six) hours as needed for wheezing.  . [DISCONTINUED] gabapentin (NEURONTIN) 100 MG capsule Take 1 capsule (100 mg total) by mouth 2 (two) times daily.  . [DISCONTINUED] lisinopril-hydrochlorothiazide (PRINZIDE,ZESTORETIC) 10-12.5 MG tablet Take 1 tablet by mouth every morning.  . [DISCONTINUED] meloxicam (MOBIC) 7.5 MG tablet Take 1 tablet (7.5 mg total) by mouth 2 (two) times daily.  . [DISCONTINUED] mometasone-formoterol (DULERA) 200-5 MCG/ACT AERO Inhale 1 puff into the lungs 2 (two) times daily.  . [DISCONTINUED] potassium chloride (K-DUR) 10 MEQ tablet TAKE 1 TABLET BY MOUTH EVERY OTHER DAY FOR  LOW  POTASSIUM  . [DISCONTINUED]  PROAIR HFA 108 (90 Base) MCG/ACT inhaler INHALE 2 PUFFS INTO LUNGS EVERY 4 TO 6 HOURS AS NEEDED FOR SHORTNESS OF BREATH  . [DISCONTINUED] sitaGLIPtin (JANUVIA) 25 MG tablet Take 1 tablet (25 mg total) by mouth daily.  . [DISCONTINUED] SPIRIVA HANDIHALER 18 MCG inhalation capsule INHALE THE CONTENTS OF 1 CAPSULE DAILY   No facility-administered encounter medications on file as of 11/17/2018.     Surgical History: Past Surgical History:  Procedure Laterality Date  . CESAREAN  SECTION     x3  . COLONOSCOPY WITH PROPOFOL N/A 06/25/2015   Procedure: COLONOSCOPY WITH PROPOFOL;  Surgeon: Lucilla Lame, MD;  Location: [facility] ENDOSCOPY;  Service: Endoscopy;  Laterality: N/A;  . CYST REMOVAL LEG     and on shoulder   . LOWER EXTREMITY ANGIOGRAPHY Left 09/29/2018   Procedure: LOWER EXTREMITY ANGIOGRAPHY;  Surgeon: Algernon Huxley, MD;  Location: [place] [facility];  Service: Cardiovascular;  Laterality: Left;  . LUNG BIOPSY  05 15 2013   has lung "spots"  . PERIPHERAL VASCULAR CATHETERIZATION Left 06/01/2016   Procedure: Lower Extremity Angiography;  Surgeon: Algernon Huxley, MD;  Location: [place] [facility];  Service: Cardiovascular;  Laterality: Left;  . PERIPHERAL VASCULAR CATHETERIZATION N/A 06/01/2016   Procedure: Abdominal Aortogram w/Lower Extremity;  Surgeon: Algernon Huxley, MD;  Location: [place] [facility];  Service: Cardiovascular;  Laterality: N/A;  . PERIPHERAL VASCULAR CATHETERIZATION  06/01/2016   Procedure: Lower Extremity Intervention;  Surgeon: Algernon Huxley, MD;  Location: [place] [facility];  Service: Cardiovascular;;  . PERIPHERAL VASCULAR CATHETERIZATION Right 06/08/2016   Procedure: Lower Extremity Angiography;  Surgeon: Algernon Huxley, MD;  Location: [place] [facility];  Service: Cardiovascular;  Laterality: Right;  . PERIPHERAL VASCULAR CATHETERIZATION  06/08/2016   Procedure: Lower Extremity Intervention;  Surgeon: Algernon Huxley, MD;  Location: [place] [facility];  Service: Cardiovascular;;    Medical History: Past Medical History:  Diagnosis Date  . Arthritis   . Asthma   . Atherosclerosis of native arteries of extremity with intermittent claudication ([place]) 05/26/2016  . Cancer [facility]) 2012   Right Lung CA  . COPD (chronic obstructive pulmonary disease) ([place])   . Depression   . Diabetes mellitus without complication [facility])    Patient takes Janumet  . Essential hypertension 05/26/2016  . Hypercholesteremia   . Hypertension   . Oxygen dependent     2L at nite   . PAD (peripheral artery disease) ([place]) 06/22/2016  . Peripheral vascular disease ([place])   . Personal history of radiation therapy   . Shortness of breath dyspnea    with exertion   . Sleep apnea     Family History: Family History  Problem Relation Age of Onset  . Lung cancer Father   . Diabetes Mother   . Hypercholesterolemia Mother   . Diabetes Sister   . Diabetes Maternal Grandmother   . Diabetes Paternal Grandmother   . Diabetes Sister   . Heart attack Brother   . Coronary artery disease Brother   . Vascular Disease Brother   . Hypertension Sister   . Heart attack Brother     Social History   Socioeconomic History  . Marital status: Widowed    Spouse name: Not on file  . Number of children: Not on file  . Years of education: Not on file  . Highest education level: Not on file  Occupational History  . Not on file  Social Needs  . Emergency planning/management officer  strain: Not on file  . Food insecurity:    Worry: Not on file    Inability: Not on file  . Transportation needs:    Medical: Not on file    Non-medical: Not on file  Tobacco Use  . Smoking status: Former Smoker    Packs/day: 1.00    Years: 37.00    Pack years: 37.00    Types: Cigarettes    Last attempt to quit: 02/06/2010    Years since quitting: 8.9  . Smokeless tobacco: Former Systems developer    Types: Snuff  Substance and Sexual Activity  . Alcohol use: Yes    Alcohol/week: 5.0 standard drinks    Types: 5 Cans of beer per week    Comment: /h x of alcohol abuse -stopped 2012- now drinks 5 beer per w  . Drug use: Yes    Types: Marijuana, "Crack" cocaine, Cocaine    Comment: hx of cocaine use- last use 2015; last use sat marijuana6/22/19,   . Sexual activity: Yes  Lifestyle  . Physical activity:    Days per week: Not on file    Minutes per session: Not on file  . Stress: Not on file  Relationships  . Social connections:    Talks on phone: Not on file    Gets together: Not on file    Attends  religious service: Not on file    Active member of club or organization: Not on file    Attends meetings of clubs or organizations: Not on file    Relationship status: Not on file  . Intimate partner violence:    Fear of current or ex partner: Not on file    Emotionally abused: Not on file    Physically abused: Not on file    Forced sexual activity: Not on file  Other Topics Concern  . Not on file  Social History Narrative  . Not on file      Review of Systems  Constitutional: Negative for chills, fatigue and unexpected weight change.  HENT: Negative for congestion, rhinorrhea, sneezing and sore throat.   Eyes: Negative for photophobia, pain and redness.  Respiratory: Positive for cough. Negative for chest tightness and shortness of breath.   Cardiovascular: Negative for chest pain and palpitations.  Gastrointestinal: Negative for abdominal pain, constipation, diarrhea, nausea and vomiting.  Endocrine: Negative.   Genitourinary: Negative for dysuria and frequency.  Musculoskeletal: Negative for arthralgias, back pain, joint swelling and neck pain.  Skin: Negative for rash.  Allergic/Immunologic: Negative.   Neurological: Negative for tremors and numbness.  Hematological: Negative for adenopathy. Does not bruise/bleed easily.  Psychiatric/Behavioral: Negative for behavioral problems and sleep disturbance. The patient is not nervous/anxious.     Vital Signs: BP 138/90   Pulse (!) 120   Resp 16   Ht 4\' 11"  (1.499 m)   Wt 111 lb (50.3 kg)   BMI 22.42 kg/m    Observation/Objective: Pt reports worse cough, however sounds relatively dry over he phone.  She is not a smoker.  She is not taking anything for allergies.      Assessment/Plan: 1. Type 2 diabetes mellitus without complication, without long-term current use of insulin (HCC) Stable at this time.  PTs FSBG at home is in the 140-150 range in AM.  Denies any new symptoms.  Will continue current medications, and follow  up in office in 6 weeks and   2. Essential hypertension BP appears stable.  Will continue to monitor.  Refilled patients lisinopril.  -  lisinopril-hydrochlorothiazide (PRINZIDE,ZESTORETIC) 10-12.5 MG tablet; Take 1 tablet by mouth every morning.  Dispense: 30 tablet; Refill: 5 - potassium chloride (K-DUR) 10 MEQ tablet; TAKE 1 TABLET BY MOUTH EVERY OTHER DAY FOR  LOW  POTASSIUM  Dispense: 15 tablet; Refill: 2  3. Chronic obstructive pulmonary disease, unspecified COPD type ([place]) Refilled patients spiriva.  Encouraged compliance with medications.    - tiotropium (SPIRIVA HANDIHALER) 18 MCG inhalation capsule; Place 1 capsule (18 mcg total) into inhaler and inhale daily.  Dispense: 90 capsule; Refill: 2  General Counseling: Lacey verbalizes understanding of the findings of today's phone visit and agrees with plan of treatment. I have discussed any further diagnostic evaluation that may be needed or ordered today. We also reviewed her medications today. she has been encouraged to call the office with any questions or concerns that should arise related to todays visit.    No orders of the defined types were placed in this encounter.   Meds ordered this encounter  Medications  . lisinopril-hydrochlorothiazide (PRINZIDE,ZESTORETIC) 10-12.5 MG tablet    Sig: Take 1 tablet by mouth every morning.    Dispense:  30 tablet    Refill:  5  . tiotropium (SPIRIVA HANDIHALER) 18 MCG inhalation capsule    Sig: Place 1 capsule (18 mcg total) into inhaler and inhale daily.    Dispense:  90 capsule    Refill:  2  . potassium chloride (K-DUR) 10 MEQ tablet    Sig: TAKE 1 TABLET BY MOUTH EVERY OTHER DAY FOR  LOW  POTASSIUM    Dispense:  15 tablet    Refill:  2    Please consider 90 day supplies to promote better adherence    Time spent: [place] AGNP-C Internal medicine

## 2018-11-21 ENCOUNTER — Ambulatory Visit: Payer: Self-pay | Admitting: Internal Medicine

## 2018-12-07 ENCOUNTER — Encounter: Payer: Self-pay | Admitting: Adult Health

## 2018-12-07 ENCOUNTER — Other Ambulatory Visit: Payer: Self-pay | Admitting: Nurse Practitioner

## 2018-12-07 MED ORDER — MELOXICAM 7.5 MG PO TABS: 7.5000 mg | ORAL_TABLET | Freq: Two times a day (BID) | ORAL | 3 refills | Status: DC

## 2018-12-07 MED ORDER — ALBUTEROL SULFATE (2.5 MG/3ML) 0.083% IN NEBU: 2.5000 mg | INHALATION_SOLUTION | Freq: Four times a day (QID) | RESPIRATORY_TRACT | 3 refills | Status: DC | PRN

## 2018-12-22 ENCOUNTER — Other Ambulatory Visit: Payer: Self-pay | Admitting: Adult Health

## 2018-12-22 MED ORDER — GABAPENTIN 100 MG PO CAPS: 100.0000 mg | ORAL_CAPSULE | Freq: Two times a day (BID) | ORAL | 5 refills | Status: DC

## 2018-12-29 ENCOUNTER — Other Ambulatory Visit: Payer: Self-pay

## 2018-12-29 ENCOUNTER — Ambulatory Visit (INDEPENDENT_AMBULATORY_CARE_PROVIDER_SITE_OTHER): Payer: BLUE CROSS/BLUE SHIELD | Admitting: Adult Health

## 2018-12-29 ENCOUNTER — Encounter: Payer: Self-pay | Admitting: Adult Health

## 2018-12-29 VITALS — BP 126/78 | HR 100 | Resp 16 | Ht 59.0 in | Wt 115.0 lb

## 2018-12-29 DIAGNOSIS — J9611 Chronic respiratory failure with hypoxia: Secondary | ICD-10-CM

## 2018-12-29 DIAGNOSIS — Z9981 Dependence on supplemental oxygen: Secondary | ICD-10-CM | POA: Diagnosis not present

## 2018-12-29 DIAGNOSIS — R0602 Shortness of breath: Secondary | ICD-10-CM

## 2018-12-29 DIAGNOSIS — I10 Essential (primary) hypertension: Secondary | ICD-10-CM

## 2018-12-29 DIAGNOSIS — J449 Chronic obstructive pulmonary disease, unspecified: Secondary | ICD-10-CM

## 2018-12-29 DIAGNOSIS — C3431 Malignant neoplasm of lower lobe, right bronchus or lung: Secondary | ICD-10-CM

## 2018-12-29 NOTE — Progress Notes (Signed)
[facility] [place], [place] [address]  Pulmonary Sleep Medicine   Office Visit Note  Patient Name: Angela Jensen DOB: 03/19/1956 MRN [id number]  Date of Service: 12/29/2018  Complaints/HPI: PT here for follow up on COPD and chronic respiratory failure.  She reports overall she has been doing well.  She denies illness or hospitalization.  She continues to wear her oxygen on 2 liters at night while sleeping.  She denies Chest pain, Shortness of breath, palpitations, headache, or blurred vision.  No fever or hemoptysis. She has been using her inhalers as prescribed.   ROS  General: (-) fever, (-) chills, (-) night sweats, (-) weakness Skin: (-) rashes, (-) itching,. Eyes: (-) visual changes, (-) redness, (-) itching. Nose and Sinuses: (-) nasal stuffiness or itchiness, (-) postnasal drip, (-) nosebleeds, (-) sinus trouble. Mouth and Throat: (-) sore throat, (-) hoarseness. Neck: (-) swollen glands, (-) enlarged thyroid, (-) neck pain. Respiratory: - cough, (-) bloody sputum, - shortness of breath, - wheezing. Cardiovascular: - ankle swelling, (-) chest pain. Lymphatic: (-) lymph node enlargement. Neurologic: (-) numbness, (-) tingling. Psychiatric: (-) anxiety, (-) depression   Current Medication: Outpatient Encounter Medications as of 12/29/2018  Medication Sig  . ACCU-CHEK FASTCLIX LANCETS MISC Use as directed twice a day diag E11.65  . albuterol (PROVENTIL) (2.5 MG/3ML) 0.083% nebulizer solution Take 3 mLs (2.5 mg total) by nebulization every 6 (six) hours as needed for wheezing.  Marland Kitchen amLODipine (NORVASC) 10 MG tablet Take 1 tablet (10 mg total) by mouth every morning.  Marland Kitchen aspirin EC 81 MG tablet Take 1 tablet (81 mg total) by mouth daily.  Marland Kitchen buPROPion (WELLBUTRIN XL) 150 MG 24 hr tablet TAKE 1 TABLET BY MOUTH TWICE DAILY FOR SMOKING CESSATION  . clopidogrel (PLAVIX) 75 MG tablet Take 1 tablet (75 mg total) by mouth daily.  Marland Kitchen gabapentin (NEURONTIN) 100 MG  capsule Take 1 capsule (100 mg total) by mouth 2 (two) times daily.  Marland Kitchen glucose blood (ACCU-CHEK AVIVA PLUS) test strip Use as instructed to check blood sugars three times daily.  E11.65  . lisinopril-hydrochlorothiazide (PRINZIDE,ZESTORETIC) 10-12.5 MG tablet Take 1 tablet by mouth every morning.  . meloxicam (MOBIC) 7.5 MG tablet Take 1 tablet (7.5 mg total) by mouth 2 (two) times daily.  . metFORMIN (GLUCOPHAGE-XR) 500 MG 24 hr tablet Take 1 tablet (500 mg total) by mouth daily with breakfast.  . mometasone-formoterol (DULERA) 200-5 MCG/ACT AERO Inhale 1 puff into the lungs 2 (two) times daily.  . OXYGEN Inhale 2 L into the lungs at bedtime.  . potassium chloride (K-DUR) 10 MEQ tablet TAKE 1 TABLET BY MOUTH EVERY OTHER DAY FOR  LOW  POTASSIUM  . PROAIR HFA 108 (90 Base) MCG/ACT inhaler INHALE 2 PUFFS INTO LUNGS EVERY 4 TO 6 HOURS AS NEEDED FOR SHORTNESS OF BREATH  . rOPINIRole (REQUIP) 0.5 MG tablet Take 1 tablet (0.5 mg total) by mouth 2 (two) times daily.  . rosuvastatin (CRESTOR) 10 MG tablet Take 1 tablet (10 mg total) by mouth at bedtime.  . sitaGLIPtin (JANUVIA) 25 MG tablet Take 1 tablet (25 mg total) by mouth daily.  Marland Kitchen tiotropium (SPIRIVA HANDIHALER) 18 MCG inhalation capsule Place 1 capsule (18 mcg total) into inhaler and inhale daily.   No facility-administered encounter medications on file as of 12/29/2018.     Surgical History: Past Surgical History:  Procedure Laterality Date  . CESAREAN SECTION     x3  . COLONOSCOPY WITH PROPOFOL N/A 06/25/2015  Procedure: COLONOSCOPY WITH PROPOFOL;  Surgeon: Lucilla Lame, MD;  Location: [facility] ENDOSCOPY;  Service: Endoscopy;  Laterality: N/A;  . CYST REMOVAL LEG     and on shoulder   . LOWER EXTREMITY ANGIOGRAPHY Left 09/29/2018   Procedure: LOWER EXTREMITY ANGIOGRAPHY;  Surgeon: Algernon Huxley, MD;  Location: [place] [facility];  Service: Cardiovascular;  Laterality: Left;  . LUNG BIOPSY  05 15 2013   has lung "spots"  . PERIPHERAL VASCULAR  CATHETERIZATION Left 06/01/2016   Procedure: Lower Extremity Angiography;  Surgeon: Algernon Huxley, MD;  Location: [place] [facility];  Service: Cardiovascular;  Laterality: Left;  . PERIPHERAL VASCULAR CATHETERIZATION N/A 06/01/2016   Procedure: Abdominal Aortogram w/Lower Extremity;  Surgeon: Algernon Huxley, MD;  Location: [place] [facility];  Service: Cardiovascular;  Laterality: N/A;  . PERIPHERAL VASCULAR CATHETERIZATION  06/01/2016   Procedure: Lower Extremity Intervention;  Surgeon: Algernon Huxley, MD;  Location: [place] [facility];  Service: Cardiovascular;;  . PERIPHERAL VASCULAR CATHETERIZATION Right 06/08/2016   Procedure: Lower Extremity Angiography;  Surgeon: Algernon Huxley, MD;  Location: [place] [facility];  Service: Cardiovascular;  Laterality: Right;  . PERIPHERAL VASCULAR CATHETERIZATION  06/08/2016   Procedure: Lower Extremity Intervention;  Surgeon: Algernon Huxley, MD;  Location: [place] [facility];  Service: Cardiovascular;;    Medical History: Past Medical History:  Diagnosis Date  . Arthritis   . Asthma   . Atherosclerosis of native arteries of extremity with intermittent claudication ([place]) 05/26/2016  . Cancer [facility] 2012   Right Lung CA  . COPD (chronic obstructive pulmonary disease) ([place])   . Depression   . Diabetes mellitus without complication [facility])    Patient takes Janumet  . Essential hypertension 05/26/2016  . Hypercholesteremia   . Hypertension   . 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 PAD (peripheral artery disease) ([place]) 06/22/2016  . Peripheral vascular disease ([place])   . Personal history of radiation therapy   . Shortness of breath dyspnea    with exertion   . Sleep apnea     Family History: Family History  Problem Relation Age of Onset  . Lung cancer Father   . Diabetes Mother   . Hypercholesterolemia Mother   . Diabetes Sister   . Diabetes Maternal Grandmother   . Diabetes Paternal Grandmother   . Diabetes Sister   . Heart attack Brother   .  Coronary artery disease Brother   . Vascular Disease Brother   . Hypertension Sister   . Heart attack Brother     Social History: Social History   Socioeconomic History  . Marital status: Widowed    Spouse name: Not on file  . Number of children: Not on file  . Years of education: Not on file  . Highest education level: Not on file  Occupational History  . Not on file  Social Needs  . Financial resource strain: Not on file  . Food insecurity:    Worry: Not on file    Inability: Not on file  . Transportation needs:    Medical: Not on file    Non-medical: Not on file  Tobacco Use  . Smoking status: Former Smoker    Packs/day: 1.00    Years: 37.00    Pack years: 37.00    Types: Cigarettes    Last attempt to quit: 02/06/2010    Years since quitting: 8.8  . Smokeless tobacco: Former Systems developer    Types: Snuff  Substance and Sexual Activity  . Alcohol use: Yes    Alcohol/week: 5.0 standard drinks    Types: 5 Cans of beer per week    Comment: /h x of alcohol abuse -stopped 2012- now drinks 5 beer per w  . Drug use: Not Currently    Types: Marijuana, "Crack" cocaine, Cocaine    Comment: hx of cocaine use- last use 2015; last use sat marijuana6/22/19,   . Sexual activity: Yes  Lifestyle  . Physical activity:    Days per week: Not on file    Minutes per session: Not on file  . Stress: Not on file  Relationships  . Social connections:    Talks on phone: Not on file    Gets together: Not on file    Attends religious service: Not on file    Active member of club or organization: Not on file    Attends meetings of clubs or organizations: Not on file    Relationship status: Not on file  . Intimate partner violence:    Fear of current or ex partner: Not on file    Emotionally abused: Not on file    Physically abused: Not on file    Forced sexual activity: Not on file  Other Topics Concern  . Not on file  Social History Narrative  . Not on file    Vital Signs: Blood  pressure 126/78, pulse 100, resp. rate 16, height 4\' 11"  (1.499 m), weight 115 lb (52.2 kg), SpO2 95 %.  Examination: General Appearance: The patient is well-developed, well-nourished, and in no distress. Skin: Gross inspection of skin unremarkable. Head: normocephalic, no gross deformities. Eyes: no gross deformities noted. ENT: ears appear grossly normal no exudates. Neck: Supple. No thyromegaly. No LAD. Respiratory: clear bilateraly. Cardiovascular: Normal S1 and S2 without murmur or rub. Extremities: No cyanosis. pulses are equal. Neurologic: Alert and oriented. No involuntary movements.  LABS: No results found for this or any previous visit (from the past 2160 hour(s)).  Radiology: No results found.  No results found.  No results found.    Assessment and Plan: Patient Active Problem List   Diagnosis Date Noted  . Uncontrolled type 2 diabetes mellitus with hyperglycemia ([place]) 05/16/2018  . Chronic obstructive pulmonary disease ([place]) 05/16/2018  . Vitamin D deficiency 05/16/2018  . Flu vaccine need 05/16/2018  . Dysuria 05/16/2018  . Cancer of lower lobe of right lung ([place]) 01/21/2018  . Screening for breast cancer 10/12/2017  . Personal history of tobacco use, presenting hazards to health 02/03/2017  . PAD (peripheral artery disease) ([place]) 06/22/2016  . Essential hypertension 05/26/2016  . Diabetes ([place]) 05/26/2016  . Hyperlipidemia 05/26/2016  . Atherosclerosis of native arteries of extremity with intermittent claudication ([place]) 05/26/2016  . Uterine leiomyoma 04/30/2016  . Elevated CEA 01/31/2016  . Special screening for malignant neoplasms, colon   . Benign neoplasm of sigmoid colon   . Primary lung cancer ([place])   . Malignant neoplasm of upper lobe of right lung ([place])     1. Chronic obstructive pulmonary disease, unspecified COPD type ([place]) Continue using inhalers and oxygen as prescribed.  - Pulmonary Function Test; Future  2. Chronic respiratory failure  with hypoxia ([facility]) Continues to use and require oxygen at night.  Currently using 2 lpm with good results.    3. Oxygen dependent Continue to wear oxygen at 2 lpm at night.    4. Cancer of lower lobe of right lung ([place]) Completed 6 weeks of radiation.  Doing  well, will follow up with oncology in June as scheduled.   5. Essential hypertension Stable, continue present management.   6. SOB (shortness of breath) FVC is 1.3 which is 65% of the predicted value FEV1 is 0.9 which is 55% of the predicted value FEV1/FVC is 65% which is 83% of the predicted value on today spirometry. - Spirometry with Graph  General Counseling: I have discussed the findings of the evaluation and examination with [facility].  I have also discussed any further diagnostic evaluation thatmay be needed or ordered today. Rorey verbalizes understanding of the findings of todays visit. We also reviewed her medications today and discussed drug interactions and side effects including but not limited excessive drowsiness and altered mental states. We also discussed that there is always a risk not just to her but also people around her. she has been encouraged to call the office with any questions or concerns that should arise related to todays visit.    Time spent: 20 This patient was seen by Orson Gear AGNP-C in Collaboration with Dr. Devona Konig as a part of collaborative care agreement.   I have personally obtained a history, examined the patient, evaluated laboratory and imaging results, formulated the assessment and plan and placed orders.    Allyne Gee, MD [facility] Sleep medicine

## 2018-12-29 NOTE — Patient Instructions (Signed)
Chronic Obstructive Pulmonary Disease Chronic obstructive pulmonary disease (COPD) is a long-term (chronic) lung problem. When you have COPD, it is hard for air to get in and out of your lungs. Usually the condition gets worse over time, and your lungs will never return to normal. There are things you can do to keep yourself as healthy as possible.  Your doctor may treat your condition with: ? Medicines. ? Oxygen. ? Lung surgery.  Your doctor may also recommend: ? Rehabilitation. This includes steps to make your body work better. It may involve a team of specialists. ? Quitting smoking, if you smoke. ? Exercise and changes to your diet. ? Comfort measures (palliative care). Follow these instructions at home: Medicines  Take over-the-counter and prescription medicines only as told by your doctor.  Talk to your doctor before taking any cough or allergy medicines. You may need to avoid medicines that cause your lungs to be dry. Lifestyle  If you smoke, stop. Smoking makes the problem worse. If you need help quitting, ask your doctor.  Avoid being around things that make your breathing worse. This may include smoke, chemicals, and fumes.  Stay active, but remember to rest as well.  Learn and use tips on how to relax.  Make sure you get enough sleep. Most adults need at least 7 hours of sleep every night.  Eat healthy foods. Eat smaller meals more often. Rest before meals. Controlled breathing Learn and use tips on how to control your breathing as told by your doctor. Try:  Breathing in (inhaling) through your nose for 1 second. Then, pucker your lips and breath out (exhale) through your lips for 2 seconds.  Putting one hand on your belly (abdomen). Breathe in slowly through your nose for 1 second. Your hand on your belly should move out. Pucker your lips and breathe out slowly through your lips. Your hand on your belly should move in as you breathe out.  Controlled coughing Learn  and use controlled coughing to clear mucus from your lungs. Follow these steps: 1. Lean your head a little forward. 2. Breathe in deeply. 3. Try to hold your breath for 3 seconds. 4. Keep your mouth slightly open while coughing 2 times. 5. Spit any mucus out into a tissue. 6. Rest and do the steps again 1 or 2 times as needed. General instructions  Make sure you get all the shots (vaccines) that your doctor recommends. Ask your doctor about a flu shot and a pneumonia shot.  Use oxygen therapy and pulmonary rehabilitation if told by your doctor. If you need home oxygen therapy, ask your doctor if you should buy a tool to measure your oxygen level (oximeter).  Make a COPD action plan with your doctor. This helps you to know what to do if you feel worse than usual.  Manage any other conditions you have as told by your doctor.  Avoid going outside when it is very hot, cold, or humid.  Avoid people who have a sickness you can catch (contagious).  Keep all follow-up visits as told by your doctor. This is important. Contact a doctor if:  You cough up more mucus than usual.  There is a change in the color or thickness of the mucus.  It is harder to breathe than usual.  Your breathing is faster than usual.  You have trouble sleeping.  You need to use your medicines more often than usual.  You have trouble doing your normal activities such as getting dressed   or walking around the house. Get help right away if:  You have shortness of breath while resting.  You have shortness of breath that stops you from: ? Being able to talk. ? Doing normal activities.  Your chest hurts for longer than 5 minutes.  Your skin color is more blue than usual.  Your pulse oximeter shows that you have low oxygen for longer than 5 minutes.  You have a fever.  You feel too tired to breathe normally. Summary  Chronic obstructive pulmonary disease (COPD) is a long-term lung problem.  The way your  lungs work will never return to normal. Usually the condition gets worse over time. There are things you can do to keep yourself as healthy as possible.  Take over-the-counter and prescription medicines only as told by your doctor.  If you smoke, stop. Smoking makes the problem worse. This information is not intended to replace advice given to you by your health care provider. Make sure you discuss any questions you have with your health care provider. Document Released: 01/20/2008 Document Revised: 09/07/2016 Document Reviewed: 09/07/2016 Elsevier Interactive Patient Education  2019 [employer]  

## 2019-01-02 ENCOUNTER — Other Ambulatory Visit: Payer: Self-pay

## 2019-01-02 ENCOUNTER — Encounter: Payer: Self-pay | Admitting: Nurse Practitioner

## 2019-01-02 ENCOUNTER — Ambulatory Visit: Payer: BLUE CROSS/BLUE SHIELD | Admitting: Nurse Practitioner

## 2019-01-02 VITALS — BP 121/80 | HR 90 | Resp 16 | Ht 59.0 in | Wt 115.2 lb

## 2019-01-02 DIAGNOSIS — E1165 Type 2 diabetes mellitus with hyperglycemia: Secondary | ICD-10-CM | POA: Diagnosis not present

## 2019-01-02 DIAGNOSIS — I10 Essential (primary) hypertension: Secondary | ICD-10-CM

## 2019-01-02 DIAGNOSIS — J449 Chronic obstructive pulmonary disease, unspecified: Secondary | ICD-10-CM | POA: Diagnosis not present

## 2019-01-02 LAB — POCT GLYCOSYLATED HEMOGLOBIN (HGB A1C): Hemoglobin A1C: 5.7 % — AB (ref 4.0–5.6)

## 2019-01-02 MED ORDER — PROAIR HFA 108 (90 BASE) MCG/ACT IN AERS: 2.0000 | INHALATION_SPRAY | RESPIRATORY_TRACT | 6 refills | Status: DC | PRN

## 2019-01-02 MED ORDER — MOMETASONE FURO-FORMOTEROL FUM 200-5 MCG/ACT IN AERO: 1.0000 | INHALATION_SPRAY | Freq: Two times a day (BID) | RESPIRATORY_TRACT | 3 refills | Status: DC

## 2019-01-02 NOTE — Progress Notes (Signed)
[facility] [place], [place] [address]  Internal MEDICINE  Office Visit Note  Patient Name: Angela Jensen  [id number]  [id number]  Date of Service: 01/02/2019  Chief Complaint  Patient presents with  . Follow-up  . Diabetes    The patient is here for routine follow up. She does have diabetes. Blood sugars are well controlled. HgbA1c is 5.9 today. She has no concerns or complaints. She did have cold over the holidays. Does have mild, residual cough. Denies chest pain or chest pressure. Does have some shortness of breath which is controlled with inhalers. Did have radiation treatment last year. Last treatment was 03/2018. She was told mild, congested cough would likely be around for some time. She has chest CT scheduled for 01/17/2019 for further evaluation and surveillance.       Current Medication: Outpatient Encounter Medications as of 01/02/2019  Medication Sig  . ACCU-CHEK FASTCLIX LANCETS MISC Use as directed twice a day diag E11.65  . albuterol (PROVENTIL) (2.5 MG/3ML) 0.083% nebulizer solution Take 3 mLs (2.5 mg total) by nebulization every 6 (six) hours as needed for wheezing.  Marland Kitchen amLODipine (NORVASC) 10 MG tablet Take 1 tablet (10 mg total) by mouth every morning.  Marland Kitchen aspirin EC 81 MG tablet Take 1 tablet (81 mg total) by mouth daily.  Marland Kitchen buPROPion (WELLBUTRIN XL) 150 MG 24 hr tablet TAKE 1 TABLET BY MOUTH TWICE DAILY FOR SMOKING CESSATION  . clopidogrel (PLAVIX) 75 MG tablet Take 1 tablet (75 mg total) by mouth daily.  Marland Kitchen gabapentin (NEURONTIN) 100 MG capsule Take 1 capsule (100 mg total) by mouth 2 (two) times daily.  Marland Kitchen glucose blood (ACCU-CHEK AVIVA PLUS) test strip Use as instructed to check blood sugars three times daily.  E11.65  . lisinopril-hydrochlorothiazide (PRINZIDE,ZESTORETIC) 10-12.5 MG tablet Take 1 tablet by mouth every morning.  . meloxicam (MOBIC) 7.5 MG tablet Take 1 tablet (7.5 mg total) by mouth 2 (two) times daily.  . metFORMIN  (GLUCOPHAGE-XR) 500 MG 24 hr tablet Take 1 tablet (500 mg total) by mouth daily with breakfast.  . mometasone-formoterol (DULERA) 200-5 MCG/ACT AERO Inhale 1 puff into the lungs 2 (two) times daily. Please fill as 90 day prescription  . OXYGEN Inhale 2 L into the lungs at bedtime.  . potassium chloride (K-DUR) 10 MEQ tablet TAKE 1 TABLET BY MOUTH EVERY OTHER DAY FOR  LOW  POTASSIUM  . PROAIR HFA 108 (90 Base) MCG/ACT inhaler Inhale 2 puffs into the lungs every 4 (four) hours as needed for wheezing or shortness of breath.  Marland Kitchen rOPINIRole (REQUIP) 0.5 MG tablet Take 1 tablet (0.5 mg total) by mouth 2 (two) times daily.  . rosuvastatin (CRESTOR) 10 MG tablet Take 1 tablet (10 mg total) by mouth at bedtime.  . sitaGLIPtin (JANUVIA) 25 MG tablet Take 1 tablet (25 mg total) by mouth daily.  Marland Kitchen tiotropium (SPIRIVA HANDIHALER) 18 MCG inhalation capsule Place 1 capsule (18 mcg total) into inhaler and inhale daily.  . [DISCONTINUED] mometasone-formoterol (DULERA) 200-5 MCG/ACT AERO Inhale 1 puff into the lungs 2 (two) times daily.  . [DISCONTINUED] PROAIR HFA 108 (90 Base) MCG/ACT inhaler INHALE 2 PUFFS INTO LUNGS EVERY 4 TO 6 HOURS AS NEEDED FOR SHORTNESS OF BREATH   No facility-administered encounter medications on file as of 01/02/2019.     Surgical History: Past Surgical History:  Procedure Laterality Date  . CESAREAN SECTION     x3  . COLONOSCOPY WITH PROPOFOL N/A 06/25/2015   Procedure: COLONOSCOPY  WITH PROPOFOL;  Surgeon: Lucilla Lame, MD;  Location[facility] ENDOSCOPY;  Service: Endoscopy;  Laterality: N/A;  . CYST REMOVAL LEG     and on shoulder   . LOWER EXTREMITY ANGIOGRAPHY Left 09/29/2018   Procedure: LOWER EXTREMITY ANGIOGRAPHY;  Surgeon: Algernon Huxley, MD;  Location: [place] CV LAB;  Service: Cardiovascular;  Laterality: Left;  . LUNG BIOPSY  05 15 2013   has lung "spots"  . PERIPHERAL VASCULAR CATHETERIZATION Left 06/01/2016   Procedure: Lower Extremity Angiography;  Surgeon: Algernon Huxley, MD;  Location: [place] [facility] LAB;  Service: Cardiovascular;  Laterality: Left;  . PERIPHERAL VASCULAR CATHETERIZATION N/A 06/01/2016   Procedure: Abdominal Aortogram w/Lower Extremity;  Surgeon: Algernon Huxley, MD;  Location: [place] [facility] LAB;  Service: Cardiovascular;  Laterality: N/A;  . PERIPHERAL VASCULAR CATHETERIZATION  06/01/2016   Procedure: Lower Extremity Intervention;  Surgeon: Algernon Huxley, MD;  Location: [place] [facility];  Service: Cardiovascular;;  . PERIPHERAL VASCULAR CATHETERIZATION Right 06/08/2016   Procedure: Lower Extremity Angiography;  Surgeon: Algernon Huxley, MD;  Location: [place] [facility];  Service: Cardiovascular;  Laterality: Right;  . PERIPHERAL VASCULAR CATHETERIZATION  06/08/2016   Procedure: Lower Extremity Intervention;  Surgeon: Algernon Huxley, MD;  Location: [place] [facility];  Service: Cardiovascular;;    Medical History: Past Medical History:  Diagnosis Date  . Arthritis   . Asthma   . Atherosclerosis of native arteries of extremity with intermittent claudication ([place]) 05/26/2016  . Cancer [facility]) 2012   Right Lung CA  . COPD (chronic obstructive pulmonary disease) ([place])   . Depression   . Diabetes mellitus without complication [facility])    Patient takes Janumet  . Essential hypertension 05/26/2016  . Hypercholesteremia   . Hypertension   . 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 PAD (peripheral artery disease) ([place]) 06/22/2016  . Peripheral vascular disease ([place])   . Personal history of radiation therapy   . Shortness of breath dyspnea    with exertion   . Sleep apnea     Family History: Family History  Problem Relation Age of Onset  . Lung cancer Father   . Diabetes Mother   . Hypercholesterolemia Mother   . Diabetes Sister   . Diabetes Maternal Grandmother   . Diabetes Paternal Grandmother   . Diabetes Sister   . Heart attack Brother   . Coronary artery disease Brother   . Vascular Disease Brother   . Hypertension Sister   .  Heart attack Brother     Social History   Socioeconomic History  . Marital status: Widowed    Spouse name: Not on file  . Number of children: Not on file  . Years of education: Not on file  . Highest education level: Not on file  Occupational History  . Not on file  Social Needs  . Financial resource strain: Not on file  . Food insecurity:    Worry: Not on file    Inability: Not on file  . Transportation needs:    Medical: Not on file    Non-medical: Not on file  Tobacco Use  . Smoking status: Former Smoker    Packs/day: 1.00    Years: 37.00    Pack years: 37.00    Types: Cigarettes    Last attempt to quit: 02/06/2010    Years since quitting: 8.9  . Smokeless tobacco: Former Systems developer    Types: Snuff  Substance and Sexual  Activity  . Alcohol use: Yes    Alcohol/week: 5.0 standard drinks    Types: 5 Cans of beer per week    Comment: /h x of alcohol abuse -stopped 2012- now drinks 5 beer per w  . Drug use: Yes    Types: Marijuana, "Crack" cocaine, Cocaine    Comment: hx of cocaine use- last use 2015; last use sat marijuana6/22/19,   . Sexual activity: Yes  Lifestyle  . Physical activity:    Days per week: Not on file    Minutes per session: Not on file  . Stress: Not on file  Relationships  . Social connections:    Talks on phone: Not on file    Gets together: Not on file    Attends religious service: Not on file    Active member of club or organization: Not on file    Attends meetings of clubs or organizations: Not on file    Relationship status: Not on file  . Intimate partner violence:    Fear of current or ex partner: Not on file    Emotionally abused: Not on file    Physically abused: Not on file    Forced sexual activity: Not on file  Other Topics Concern  . Not on file  Social History Narrative  . Not on file      Review of Systems  Constitutional: Negative for chills, fatigue and unexpected weight change.  HENT: Negative for congestion, postnasal  drip, rhinorrhea, sneezing and sore throat.   Eyes: Negative.  Negative for redness.  Respiratory: Positive for cough and wheezing. Negative for chest tightness and shortness of breath.        Intermittent.   Cardiovascular: Negative for chest pain and palpitations.  Gastrointestinal: Negative for abdominal pain, constipation, diarrhea, nausea and vomiting.  Endocrine: Negative for cold intolerance, heat intolerance, polydipsia and polyuria.       Blood sugars doing well   Musculoskeletal: Negative for arthralgias, back pain, joint swelling and neck pain.  Skin: Negative for rash.  Allergic/Immunologic: Positive for environmental allergies.  Neurological: Negative for dizziness, tremors, numbness and headaches.  Hematological: Negative for adenopathy. Does not bruise/bleed easily.  Psychiatric/Behavioral: Negative for behavioral problems (Depression), sleep disturbance and suicidal ideas. The patient is not nervous/anxious.     Today's Vitals   01/02/19 0926  BP: 121/80  Pulse: 90  Resp: 16  SpO2: 95%  Weight: 115 lb 3.2 oz (52.3 kg)  Height: 4\' 11"  (1.499 m)   Body mass index is 23.27 kg/m.  Physical Exam Vitals signs and nursing note reviewed.  Constitutional:      General: She is not in acute distress.    Appearance: Normal appearance. She is well-developed. She is not diaphoretic.  HENT:     Head: Normocephalic and atraumatic.     Mouth/Throat:     Pharynx: No oropharyngeal exudate.  Eyes:     Extraocular Movements: Extraocular movements intact.     Pupils: Pupils are equal, round, and reactive to light.  Neck:     Musculoskeletal: Normal range of motion and neck supple.     Thyroid: No thyromegaly.     Vascular: No carotid bruit or JVD.     Trachea: No tracheal deviation.  Cardiovascular:     Rate and Rhythm: Normal rate and regular rhythm.     Heart sounds: Normal heart sounds. No murmur. No friction rub. No gallop.   Pulmonary:     Effort: Pulmonary effort is  normal. No  respiratory distress.     Breath sounds: No wheezing or rales.     Comments: Very mild congested lung fields with deep inspiration, otherwise lung sounds are clear.  Chest:     Chest wall: No tenderness.  Abdominal:     General: Bowel sounds are normal.     Palpations: Abdomen is soft.  Musculoskeletal: Normal range of motion.  Lymphadenopathy:     Cervical: No cervical adenopathy.  Skin:    General: Skin is warm and dry.  Neurological:     Mental Status: She is alert and oriented to person, place, and time.     Cranial Nerves: No cranial nerve deficit.  Psychiatric:        Behavior: Behavior normal.        Thought Content: Thought content normal.        Judgment: Judgment normal.    Assessment/Plan: 1. Type 2 diabetes mellitus with hyperglycemia, unspecified whether long term insulin use (HCC) - POCT HgB A1C 5.7 today. Continue diabetic medication as prescribed.   2. Chronic obstructive pulmonary disease, unspecified COPD type ([place]) Overall stable, continue dulera twice daily. Use rescue inhaler as needed and as prescribed. CT chest scheduled for 01/17/2019. Follow up with pulmonary as scheduled.  - PROAIR HFA 108 (90 Base) MCG/ACT inhaler; Inhale 2 puffs into the lungs every 4 (four) hours as needed for wheezing or shortness of breath.  Dispense: 9 each; Refill: 6 - mometasone-formoterol (DULERA) 200-5 MCG/ACT AERO; Inhale 1 puff into the lungs 2 (two) times daily. Please fill as 90 day prescription  Dispense: 3 Inhaler; Refill: 3  3. Essential hypertension Stable. Continue bp medication as prescribed   General Counseling: Teneil verbalizes understanding of the findings of todays visit and agrees with plan of treatment. I have discussed any further diagnostic evaluation that may be needed or ordered today. We also reviewed her medications today. she has been encouraged to call the office with any questions or concerns that should arise related to todays visit.  Diabetes  Counseling:  1. Addition of ACE inh/ ARB'S for nephroprotection. Microalbumin is updated  2. Diabetic foot care, prevention of complications. Podiatry consult 3. Exercise and lose weight.  4. Diabetic eye examination, Diabetic eye exam is updated  5. Monitor blood sugar closlely. nutrition counseling.  6. Sign and symptoms of hypoglycemia including shaking sweating,confusion and headaches.  This patient was seen by Leretha Pol FNP Collaboration with Dr [name] as a part of collaborative care agreement  Orders Placed This Encounter  Procedures  . POCT HgB A1C    Meds ordered this encounter  Medications  . PROAIR HFA 108 (90 Base) MCG/ACT inhaler    Sig: Inhale 2 puffs into the lungs every 4 (four) hours as needed for wheezing or shortness of breath.    Dispense:  9 each    Refill:  6    Please consider 90 day supplies to promote better adherence    Order Specific Question:   Supervising Provider    Answer:   [name] [place]  . mometasone-formoterol (DULERA) 200-5 MCG/ACT AERO    Sig: Inhale 1 puff into the lungs 2 (two) times daily. Please fill as 90 day prescription    Dispense:  3 Inhaler    Refill:  3    Order Specific Question:   Supervising Provider    Answer:   [name] [[id number]]    Time spent: 25 Minutes      Dr [name] Internal  medicine

## 2019-01-02 NOTE — Progress Notes (Signed)
poct

## 2019-01-17 ENCOUNTER — Ambulatory Visit: Admission: RE | Admit: 2019-01-17 | Payer: BLUE CROSS/BLUE SHIELD | Source: Ambulatory Visit

## 2019-01-17 ENCOUNTER — Ambulatory Visit
Admission: RE | Admit: 2019-01-17 | Discharge: 2019-01-17 | Disposition: A | Discharge status: Home / Self Care | Payer: BLUE CROSS/BLUE SHIELD | Source: Ambulatory Visit | Attending: Urgent Care | Admitting: Urgent Care

## 2019-01-17 ENCOUNTER — Encounter: Payer: Self-pay | Admitting: Adult Health

## 2019-01-17 ENCOUNTER — Other Ambulatory Visit: Payer: Self-pay

## 2019-01-17 DIAGNOSIS — C3411 Malignant neoplasm of upper lobe, right bronchus or lung: Secondary | ICD-10-CM

## 2019-01-17 DIAGNOSIS — C3431 Malignant neoplasm of lower lobe, right bronchus or lung: Secondary | ICD-10-CM | POA: Diagnosis not present

## 2019-01-19 ENCOUNTER — Ambulatory Visit: Payer: BLUE CROSS/BLUE SHIELD | Admitting: Oncology

## 2019-01-19 ENCOUNTER — Other Ambulatory Visit: Payer: Self-pay

## 2019-01-19 ENCOUNTER — Inpatient Hospital Stay: Payer: BLUE CROSS/BLUE SHIELD | Attending: Hematology and Oncology

## 2019-01-19 DIAGNOSIS — Z923 Personal history of irradiation: Secondary | ICD-10-CM | POA: Insufficient documentation

## 2019-01-19 DIAGNOSIS — E119 Type 2 diabetes mellitus without complications: Secondary | ICD-10-CM | POA: Diagnosis not present

## 2019-01-19 DIAGNOSIS — Z7951 Long term (current) use of inhaled steroids: Secondary | ICD-10-CM | POA: Insufficient documentation

## 2019-01-19 DIAGNOSIS — J449 Chronic obstructive pulmonary disease, unspecified: Secondary | ICD-10-CM | POA: Insufficient documentation

## 2019-01-19 DIAGNOSIS — Z87891 Personal history of nicotine dependence: Secondary | ICD-10-CM | POA: Diagnosis not present

## 2019-01-19 DIAGNOSIS — Z7982 Long term (current) use of aspirin: Secondary | ICD-10-CM | POA: Insufficient documentation

## 2019-01-19 DIAGNOSIS — Z7984 Long term (current) use of oral hypoglycemic drugs: Secondary | ICD-10-CM | POA: Insufficient documentation

## 2019-01-19 DIAGNOSIS — C3431 Malignant neoplasm of lower lobe, right bronchus or lung: Secondary | ICD-10-CM

## 2019-01-19 DIAGNOSIS — Z79899 Other long term (current) drug therapy: Secondary | ICD-10-CM | POA: Diagnosis not present

## 2019-01-19 DIAGNOSIS — I10 Essential (primary) hypertension: Secondary | ICD-10-CM | POA: Insufficient documentation

## 2019-01-19 DIAGNOSIS — C3411 Malignant neoplasm of upper lobe, right bronchus or lung: Secondary | ICD-10-CM | POA: Diagnosis not present

## 2019-01-19 LAB — COMPREHENSIVE METABOLIC PANEL
ALT: 22 U/L (ref 0–44)
AST: 26 U/L (ref 15–41)
Albumin: 4.3 g/dL (ref 3.5–5.0)
Alkaline Phosphatase: 78 U/L (ref 38–126)
Anion gap: 10 (ref 5–15)
BUN: 19 mg/dL (ref 8–23)
CO2: 25 mmol/L (ref 22–32)
Calcium: 9.2 mg/dL (ref 8.9–10.3)
Chloride: 100 mmol/L (ref 98–111)
Creatinine, Ser: 0.8 mg/dL (ref 0.44–1.00)
GFR calc Af Amer: >60 mL/min (ref >60)
GFR calc non Af Amer: >60 mL/min (ref >60)
Glucose, Bld: 113 mg/dL — ABNORMAL HIGH (ref 70–99)
Potassium: 3.9 mmol/L (ref 3.5–5.1)
Sodium: 135 mmol/L (ref 135–145)
Total Bilirubin: 0.2 mg/dL — ABNORMAL LOW (ref 0.3–1.2)
Total Protein: 8.3 g/dL — ABNORMAL HIGH (ref 6.5–8.1)

## 2019-01-19 LAB — CBC WITH DIFFERENTIAL/PLATELET
Abs Immature Granulocytes: 0.02 10*3/uL (ref 0.00–0.07)
Basophils Absolute: 0 10*3/uL (ref 0.0–0.1)
Basophils Relative: 1 %
Eosinophils Absolute: 0.1 10*3/uL (ref 0.0–0.5)
Eosinophils Relative: 2 %
HCT: 38.5 % (ref 36.0–46.0)
Hemoglobin: 12.3 g/dL (ref 12.0–15.0)
Immature Granulocytes: 0 %
Lymphocytes Relative: 20 %
Lymphs Abs: 1.2 10*3/uL (ref 0.7–4.0)
MCH: 28.7 pg (ref 26.0–34.0)
MCHC: 31.9 g/dL (ref 30.0–36.0)
MCV: 90 fL (ref 80.0–100.0)
Monocytes Absolute: 0.6 10*3/uL (ref 0.1–1.0)
Monocytes Relative: 9 %
Neutro Abs: 4.1 10*3/uL (ref 1.7–7.7)
Neutrophils Relative %: 68 %
Platelets: 241 10*3/uL (ref 150–400)
RBC: 4.28 MIL/uL (ref 3.87–5.11)
RDW: 14.9 % (ref 11.5–15.5)
WBC: 6 10*3/uL (ref 4.0–10.5)
nRBC: 0 % (ref 0.0–0.2)

## 2019-01-20 ENCOUNTER — Telehealth: Payer: Self-pay | Admitting: Hematology and Oncology

## 2019-01-20 LAB — CEA: CEA: 7.9 ng/mL — ABNORMAL HIGH (ref 0.0–4.7)

## 2019-01-22 DIAGNOSIS — R778 Other specified abnormalities of plasma proteins: Secondary | ICD-10-CM | POA: Insufficient documentation

## 2019-01-22 NOTE — Progress Notes (Signed)
[facility]  [address] [place], [place] [address] Phone: [phone]  Fax: [phone]   Telemedicine Office Visit:  01/23/2019  Referring physician: Ronnell Freshwater, NP  I connected with Angela Jensen on 01/23/2019 at 3:24 PM by videoconferencing and verified that I was speaking with the correct person using 2 identifiers.  The patient was at home.  I discussed the limitations, risk, security and privacy concerns of performing an evaluation and management service by videoconferencing and the availability of in person appointments.  I also discussed with the patient that there may be a patient responsible charge related to this service.  The patient expressed understanding and agreed to proceed.   Chief Complaint: Angela Jensen is a 63 y.o. female with a history of stage I lung cancer s/p radiation who is seen for 6 month assessment.  HPI: The patient was last seen in the medical oncology clinic on 07/22/2018. At that time, she felt "fine".  She had stable shortness of breath on exertion.  Exam was stable.  She was seen in radiation oncology on 08/04/2018 by Dr. Baruch Gouty. Chest CT and 6 month follow-up was scheduled.   She was seen by her PCP on 08/18/2018, 11/17/2018, and 12/29/2018. She continued on medication for diabetes, HTN, HLD, and COPD.   She was seen in vascular surgery on 09/19/2018 by Eulogio Ditch, NP. Intervention for atherosclerosis of bilateral lower extremities was recommended and angiography was scheduled. She underwent angiography on 09/29/2018.   She saw Dr. Lucky Cowboy, vascular surgery, on 10/28/2018. She continued on her current medical regimen, and 3-4 month follow-up was scheduled.  Chest CT without contrast on 01/17/2019 revealed decreased subpleural reticulation at the SBRT site in the right lower lobe consistent with improved radiation change. There was residual nodularity along the fissure at this site with recommendation for close  attention on follow-up. There was stable pulmonary scarring in the right upper lobe and right middle lobe. There was centrilobular emphysema.  During the interim, the patient is doing well. She notes increased pain in her right chest below her breasts and on her side, worse with coughing for the past week. She denies any shoulder or back pain. She denies any shortness of breath. She notes she might have strained it the other day when she picked up a case of water at [facility]. She denies any fevers, sweats, or infections.    Past Medical History:  Diagnosis Date   Arthritis    Asthma    Atherosclerosis of native arteries of extremity with intermittent claudication ([place]) 05/26/2016   Cancer ([place]) 2012   Right Lung CA   COPD (chronic obstructive pulmonary disease) ([place])    Depression    Diabetes mellitus without complication ([place])    Patient takes Janumet   Essential hypertension 05/26/2016   Hypercholesteremia    Hypertension    Oxygen dependent    2L at nite    PAD (peripheral artery disease) ([place]) 06/22/2016   Peripheral vascular disease ([facility])    Personal history of radiation therapy    Shortness of breath dyspnea    with exertion    Sleep apnea     Past Surgical History:  Procedure Laterality Date   CESAREAN SECTION     x3   COLONOSCOPY WITH PROPOFOL N/A 06/25/2015   Procedure: COLONOSCOPY WITH PROPOFOL;  Surgeon: Lucilla Lame, MD;  Location: [facility];  Service: Endoscopy;  Laterality: N/A;   CYST REMOVAL LEG     and on  shoulder    LOWER EXTREMITY ANGIOGRAPHY Left 09/29/2018   Procedure: LOWER EXTREMITY ANGIOGRAPHY;  Surgeon: Algernon Huxley, MD;  Location: [place] [facility];  Service: Cardiovascular;  Laterality: Left;   LUNG BIOPSY  05 15 2013   has lung "spots"   PERIPHERAL VASCULAR CATHETERIZATION Left 06/01/2016   Procedure: Lower Extremity Angiography;  Surgeon: Algernon Huxley, MD;  Location: [place] [facility];  Service: Cardiovascular;   Laterality: Left;   PERIPHERAL VASCULAR CATHETERIZATION N/A 06/01/2016   Procedure: Abdominal Aortogram w/Lower Extremity;  Surgeon: Algernon Huxley, MD;  Location: [place] [facility];  Service: Cardiovascular;  Laterality: N/A;   PERIPHERAL VASCULAR CATHETERIZATION  06/01/2016   Procedure: Lower Extremity Intervention;  Surgeon: Algernon Huxley, MD;  Location: [place] [facility];  Service: Cardiovascular;;   PERIPHERAL VASCULAR CATHETERIZATION Right 06/08/2016   Procedure: Lower Extremity Angiography;  Surgeon: Algernon Huxley, MD;  Location: [place] [facility];  Service: Cardiovascular;  Laterality: Right;   PERIPHERAL VASCULAR CATHETERIZATION  06/08/2016   Procedure: Lower Extremity Intervention;  Surgeon: Algernon Huxley, MD;  Location: [place] [facility];  Service: Cardiovascular;;    Family History  Problem Relation Age of Onset   Lung cancer Father    Diabetes Mother    Hypercholesterolemia Mother    Diabetes Sister    Diabetes Maternal Grandmother    Diabetes Paternal Grandmother    Diabetes Sister    Heart attack Brother    Coronary artery disease Brother    Vascular Disease Brother    Hypertension Sister    Heart attack Brother     Social History:  reports that she quit smoking about 8 years ago. Her smoking use included cigarettes. She has a 37.00 pack-year smoking history. She has quit using smokeless tobacco.  Her smokeless tobacco use included snuff. She reports current alcohol use of about 5.0 standard drinks of alcohol per week. She reports current drug use. Drugs: Marijuana, "Crack" cocaine, and Cocaine. She has not smoked for 1 month.  She started smoking at 17 until she was "33 something". Her boyfriend is [name]: [phone]. She lives in [place]. The patient is alone today.  Participants in the patient's visit and their role in the encounter included the patient and Vito Berger, CMA, today.  The intake visit was provided by Vito Berger,  CMA.  Allergies: No Known Allergies  Current Medications: Current Outpatient Medications  Medication Sig Dispense Refill   ACCU-CHEK FASTCLIX LANCETS MISC Use as directed twice a day diag E11.65 100 each 1   albuterol (PROVENTIL) (2.5 MG/3ML) 0.083% nebulizer solution Take 3 mLs (2.5 mg total) by nebulization every 6 (six) hours as needed for wheezing. 75 mL 3   amLODipine (NORVASC) 10 MG tablet Take 1 tablet (10 mg total) by mouth every morning. 30 tablet 5   aspirin EC 81 MG tablet Take 1 tablet (81 mg total) by mouth daily. 150 tablet 2   buPROPion (WELLBUTRIN XL) 150 MG 24 hr tablet TAKE 1 TABLET BY MOUTH TWICE DAILY FOR SMOKING CESSATION 60 tablet 6   clopidogrel (PLAVIX) 75 MG tablet Take 1 tablet (75 mg total) by mouth daily. 30 tablet 11   gabapentin (NEURONTIN) 100 MG capsule Take 1 capsule (100 mg total) by mouth 2 (two) times daily. 60 capsule 5   glucose blood (ACCU-CHEK AVIVA PLUS) test strip Use as instructed to check blood sugars three times daily.  E11.65 250 each 5   lisinopril-hydrochlorothiazide (PRINZIDE,ZESTORETIC) 10-12.5 MG tablet  Take 1 tablet by mouth every morning. 30 tablet 5   meloxicam (MOBIC) 7.5 MG tablet Take 1 tablet (7.5 mg total) by mouth 2 (two) times daily. 60 tablet 3   metFORMIN (GLUCOPHAGE-XR) 500 MG 24 hr tablet Take 1 tablet (500 mg total) by mouth daily with breakfast. 30 tablet 3   mometasone-formoterol (DULERA) 200-5 MCG/ACT AERO Inhale 1 puff into the lungs 2 (two) times daily. Please fill as 90 day prescription 3 Inhaler 3   OXYGEN Inhale 2 L into the lungs at bedtime.     potassium chloride (K-DUR) 10 MEQ tablet TAKE 1 TABLET BY MOUTH EVERY OTHER DAY FOR  LOW  POTASSIUM 15 tablet 2   PROAIR HFA 108 (90 Base) MCG/ACT inhaler Inhale 2 puffs into the lungs every 4 (four) hours as needed for wheezing or shortness of breath. 9 each 6   rOPINIRole (REQUIP) 0.5 MG tablet Take 1 tablet (0.5 mg total) by mouth 2 (two) times daily. 180  tablet 0   rosuvastatin (CRESTOR) 10 MG tablet Take 1 tablet (10 mg total) by mouth at bedtime. 30 tablet 5   sitaGLIPtin (JANUVIA) 25 MG tablet Take 1 tablet (25 mg total) by mouth daily. 30 tablet 3   tiotropium (SPIRIVA HANDIHALER) 18 MCG inhalation capsule Place 1 capsule (18 mcg total) into inhaler and inhale daily. 90 capsule 2   No current facility-administered medications for this visit.     Review of Systems  Constitutional: Negative.  Negative for chills, fever, malaise/fatigue and weight loss (stable).       Feels "fine".  HENT: Negative.  Negative for congestion, ear discharge, ear pain, nosebleeds, sinus pain, sore throat and tinnitus.   Eyes: Negative.  Negative for double vision, photophobia, pain and discharge.  Respiratory: Positive for shortness of breath (exertional). Negative for cough, hemoptysis and sputum production.   Cardiovascular: Positive for chest pain (under breasts). Negative for palpitations, orthopnea, leg swelling and PND.  Gastrointestinal: Negative.  Negative for abdominal pain, blood in stool, constipation, diarrhea, melena, nausea and vomiting.  Genitourinary: Negative.  Negative for frequency, hematuria and urgency.  Musculoskeletal: Positive for joint pain (shoulder). Negative for back pain, falls, myalgias and neck pain.  Skin: Negative.  Negative for itching and rash.  Neurological: Negative.  Negative for dizziness, tingling, sensory change, speech change, weakness and headaches.  Endo/Heme/Allergies: Negative.  Does not bruise/bleed easily.       Diabetes.  Psychiatric/Behavioral: Negative.  Negative for depression and memory loss. The patient is not nervous/anxious and does not have insomnia.   All other systems reviewed and are negative.   Performance status (ECOG): 1  Physical Exam  Constitutional: She is oriented to person, place, and time. She appears well-developed and well-nourished. No distress.  HENT:  Head: Normocephalic and  atraumatic.  Brown hair.  Eyes: Pupils are equal, round, and reactive to light. Conjunctivae and EOM are normal. No scleral icterus.  Glasses.  Brown eyes.  Neurological: She is alert and oriented to person, place, and time.  Psychiatric: She has a normal mood and affect. Her behavior is normal. Judgment and thought content normal.  Nursing note reviewed.    Imaging studies: 07/31/2011:  Chest CT revealed a 1.1 x 0.6 cm RUL nodule and a 9 mm inferior aspect RUL nodule and a 4 mm nodule in the anterior aspect of the inferior aspect of the RUL. 08/03/2011:  PET scan revealed a 10-mm spiculated right apical pulmonary nodule (SUV 2.5) suspcious for lung cancer. Left ovary was  prominent (3.9 x 2.4 cm).    01/30/2015:  PET scan revealed a hypermetabolic and enlarging RUL pulmonary nodules concerning for lung cancer recurrence.  There was a small focus of peripheral nodular consolidation in the superior segment of the right lower lobe with differential including inflammation/infection versus neoplasm.  There was no evidence of mediastinal nodal metastasis.   03/27/2016:  PET scan revealed no hypermetabolic activity to suggest recurrent malignancy. There was a soft tissue density in the left pelvis near the inguinal ring that was not hypermetabolic, but prominent and probably separate from the left ovary. This could be a wandering fibroid.  Although the overall appearance had not changed from 2012 and was not hypermetabolic, pelvic sonography or pelvic MRI for further characterization of the uterus and ovaries can be considered. 08/01/2015:  Chest CT revealed complete resolution of the prior nodule in the right lung apex.   01/31/2016:  Chest CT revealed no active cardiopulmonary abnormalities.  There was stable appearance of right apical and right middle lobe scar like densities.   02/02/2017:  Low dose chest CT revealed new ground-glass nodule in the right lower lobe. Lung-RADS 3 (probably benign findings).    07/29/2017:  Low dose chest CT  revealed enlargement of the previously observed sub-solid pulmonary lesion. RIGHT upper lobe lesion measured 1.4 x 2.1 x 1.6 cm (previously 1.5 x 1.1 x 1.7 cm). There was also areas of focal architectural distortion in the RIGHT upper and middle lobes that were felt to be most compatible with areas of chronic post infectious or inflammatory scarring. Aortic value calcifications were noted. Lung-RADS 4BS (suspicious).  01/19/2018:  Chest CT revealed a subsolid 2.1 cm peripheral right lower lobe pulmonary nodule.  Although not appreciably changed in size, subjectively this nodule had increased in density, and remains suspicious for primary bronchogenic adenocarcinoma.  There was no thoracic adenopathy. 07/18/2018:  Chest CT revealed early postradiation change in the peripheral right lower lobe at the site of the treated tumor, with no residual measurable pulmonary nodule in this location.  There were no findings of metastatic disease in the chest. 01/17/2019:  Chest CT without contrast revealed decreased subpleural reticulation at the SBRT site in the RIGHT lower lobe consistent with improved radiation change. There was residual nodularity along the fissure at this site with recommendation for close attention on follow-up. There was stable pulmonary scarring in the right upper lobe and right middle lobe. There was centrilobular emphysema.   No visits with results within 3 Day(s) from this visit.  Latest known visit with results is:  Appointment on 01/19/2019  Component Date Value Ref Range Status   Sodium 01/19/2019 135  135 - 145 mmol/L Final   Potassium 01/19/2019 3.9  3.5 - 5.1 mmol/L Final   Chloride 01/19/2019 100  98 - 111 mmol/L Final   CO2 01/19/2019 25  22 - 32 mmol/L Final   Glucose, Bld 01/19/2019 113* 70 - 99 mg/dL Final   BUN 01/19/2019 19  8 - 23 mg/dL Final   Creatinine, Ser 01/19/2019 0.80  0.44 - 1.00 mg/dL Final   Calcium 01/19/2019 9.2   8.9 - 10.3 mg/dL Final   Total Protein 01/19/2019 8.3* 6.5 - 8.1 g/dL Final   Albumin 01/19/2019 4.3  3.5 - 5.0 g/dL Final   AST 01/19/2019 26  15 - 41 U/L Final   ALT 01/19/2019 22  0 - 44 U/L Final   Alkaline Phosphatase 01/19/2019 78  38 - 126 U/L Final   Total Bilirubin 01/19/2019  0.2* 0.3 - 1.2 mg/dL Final   GFR calc non Af Amer 01/19/2019 >60  >60 mL/min Final   GFR calc Af Amer 01/19/2019 >60  >60 mL/min Final   Anion gap 01/19/2019 10  5 - 15 Final   Performed at [facility], [address] [place], [place] [address]   CEA 01/19/2019 7.9* 0.0 - 4.7 ng/mL Final   Comment: (NOTE)                             Nonsmokers          <3.9                             Smokers             <5.6 [employer] Electrochemiluminescence Immunoassay (ECLIA) Values obtained with different assay methods or kits cannot be used interchangeably.  Results cannot be interpreted as absolute evidence of the presence or absence of malignant disease. Performed At: [facility] [place] [facility] [place], [place] [id number] Rush Farmer MD SF:[id number]    WBC 01/19/2019 6.0  4.0 - 10.5 K/uL Final   RBC 01/19/2019 4.28  3.87 - 5.11 MIL/uL Final   Hemoglobin 01/19/2019 12.3  12.0 - 15.0 g/dL Final   HCT 01/19/2019 38.5  36.0 - 46.0 % Final   MCV 01/19/2019 90.0  80.0 - 100.0 fL Final   MCH 01/19/2019 28.7  26.0 - 34.0 pg Final   MCHC 01/19/2019 31.9  30.0 - 36.0 g/dL Final   RDW 01/19/2019 14.9  11.5 - 15.5 % Final   Platelets 01/19/2019 241  150 - 400 K/uL Final   nRBC 01/19/2019 0.0  0.0 - 0.2 % Final   Neutrophils Relative % 01/19/2019 68  % Final   Neutro Abs 01/19/2019 4.1  1.7 - 7.7 K/uL Final   Lymphocytes Relative 01/19/2019 20  % Final   Lymphs Abs 01/19/2019 1.2  0.7 - 4.0 K/uL Final   Monocytes Relative 01/19/2019 9  % Final   Monocytes Absolute 01/19/2019 0.6  0.1 - 1.0 K/uL Final   Eosinophils Relative 01/19/2019 2  % Final    Eosinophils Absolute 01/19/2019 0.1  0.0 - 0.5 K/uL Final   Basophils Relative 01/19/2019 1  % Final   Basophils Absolute 01/19/2019 0.0  0.0 - 0.1 K/uL Final   Immature Granulocytes 01/19/2019 0  % Final   Abs Immature Granulocytes 01/19/2019 0.02  0.00 - 0.07 K/uL Final   Performed at [facility], [address], [place], [place] [address]    Assessment:  Angela Jensen is a 63 y.o. 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   She was not a good surgery candidate secondary to her lung function (FEV1 28%).  PET scan on 08/03/2011 revealed a 10-mm spiculated right apical pulmonary nodule (SUV 2.5) suspcious for lung cancer. Left ovary was prominent (3.9 x 2.4 cm).   CA-125 was 9.3 (normal).  CT guided biopsy of the superior and inferior RUL nodules on 12/30/2011 revealed well differentiated adenocarcinoma of lung primary.  EGFR testing revealed no mutation.  She received IMRT 6000 cGy to both nodules from 01/26/2012 - 03/10/2012.  PET scan on 01/30/2015 revealed a hypermetabolic and enlarging RUL pulmonary nodules concerning for lung cancer recurrence.  There was a small focus of peripheral nodular consolidation  in the superior segment of the right lower lobe with differential including inflammation/infection versus neoplasm.  There was no evidence of mediastinal nodal metastasis.    In 04/2015, there was an attempt at radiofrequency ablation of 1 enlarging RUL nodule.  At the time of the procedure, the nodule had shrunk to 5 mm from 9 mm.    Chest CT on 01/19/2018 revealed a subsolid 2.1 cm peripheral right lower lobe pulmonary nodule.  Although not appreciably changed in size, subjectively this nodule had increased in density, and remains suspicious for primary bronchogenic adenocarcinoma.  There was no thoracic adenopathy.  CT guided RLL biopsy on 02/09/2018 revealed adenocarcinoma of lung origin, lepidic and papillary  patterns.    She received 6000 cGy in 5 fractions to RLL nodule from 03/22/2018 - 04/05/2018.  Chest CT without contrast on 01/17/2019 revealed decreased subpleural reticulation at the SBRT site in the RIGHT lower lobe consistent with improved radiation change. There was residual nodularity along the fissure at this site with recommendation for close attention on follow-up. There was stable pulmonary scarring in the right upper lobe and right middle lobe. There was centrilobular emphysema.  She has an elevated CEA of unclear etiology.  CEA was 8.1 on 01/31/2016, 9.2 on 03/03/2016, 7.9 on 10/12/2017, and 7.9 on 01/19/2019.  Colonoscopy on 06/25/2015 revealed a 4 mm polyp in the sigmoid colon.  Pathology revealed a tubular adenoma without evidence of dysplasia or malignancy.  Pelvic MRI on 04/30/2016 revealed a 3.4 x 2.5 cm ovoid mass in the LEFT adnexa with signal characteristics identical to the intramural leiomyomas and consistent with an exophytic leiomyoma.  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  Endometrium was normal with a normal junctional zone.  Ovaries were not identified.  Symptomatically, she denies any shortness of breath.  She notes pain under her right breast after lifting a case of water.  Plan: 1.   Review labs from 01/19/2019. 2.   Stage I adenocarcinoma RUL             Patient is s/p IMRT (completed 03/10/2012).             Review chest CT from 01/17/2019.  Images personally reviewed.  Agree with radiology interpretation.  No evidence of recurrent disease.             Continue follow-up imaging every 6 months.  Schedule chest CT on  07/19/2019. 3.   RLL adenocarcinoma             Patient is s/p SBRT (completed 04/05/2018).             As above, chest CT reveals no evidence of recurrent disease. 4.   Elevated total protein  Protein 8.3 (6.5-8.1) on 01/19/2019.  Etiology unclear.  RTC for labs (myeloma panel and FLCA). 5.   Discomfort under right breast  Etiology  unclear but temporally related to lifting a case of water.  Follow-up with MD if symptoms do not resolve. 6.   RTC after chest CT for MD assessment and labs (CBC with diff, CMP, CEA).  I discussed the assessment and treatment plan with the patient.  The patient was provided an opportunity to ask questions and all were answered.  The patient agreed with the plan and demonstrated an understanding of the instructions.  The patient was advised to call back or seek an in person evaluation if the symptoms worsen or if the condition fails to improve as anticipated.  I provided 10 minutes (3:24 PM - 3:33  PM) of face-to-face video visit time during this this encounter and > 50% was spent counseling as documented under my assessment and plan.  I provided these services from the [facility] office.   Nolon Stalls, MD, PhD  01/23/2019, 3:24 PM  I, Molly Dorshimer, am acting as Education administrator for [employer]. Mike Gip, MD, PhD.  I, Winfrey Chillemi C. Mike Gip, MD, have reviewed the above documentation for accuracy and completeness, and I agree with the above.

## 2019-01-23 ENCOUNTER — Inpatient Hospital Stay (HOSPITAL_BASED_OUTPATIENT_CLINIC_OR_DEPARTMENT_OTHER): Payer: BLUE CROSS/BLUE SHIELD | Admitting: Hematology and Oncology

## 2019-01-23 ENCOUNTER — Encounter: Payer: Self-pay | Admitting: Hematology and Oncology

## 2019-01-23 DIAGNOSIS — C3431 Malignant neoplasm of lower lobe, right bronchus or lung: Secondary | ICD-10-CM

## 2019-01-23 DIAGNOSIS — C3411 Malignant neoplasm of upper lobe, right bronchus or lung: Secondary | ICD-10-CM

## 2019-01-23 DIAGNOSIS — R778 Other specified abnormalities of plasma proteins: Secondary | ICD-10-CM | POA: Diagnosis not present

## 2019-01-23 NOTE — Progress Notes (Signed)
Increase pain noted to upper chest right/ pain increase with coughing  X 1 week. The patient Name and DOB has been verified by phone today.

## 2019-01-24 ENCOUNTER — Other Ambulatory Visit: Payer: Self-pay

## 2019-01-25 ENCOUNTER — Ambulatory Visit (INDEPENDENT_AMBULATORY_CARE_PROVIDER_SITE_OTHER): Payer: BLUE CROSS/BLUE SHIELD | Admitting: Internal Medicine

## 2019-01-25 DIAGNOSIS — R0602 Shortness of breath: Secondary | ICD-10-CM

## 2019-01-25 LAB — PULMONARY FUNCTION TEST

## 2019-01-29 NOTE — Procedures (Signed)
[facility] [place], [address]  DATE OF SERVICE: January 25, 2019  Complete Pulmonary Function Testing Interpretation:  FINDINGS:  Forced vital capacity is mildly decreased.  The FEV1 is 0.69 L which is 40% of predicted and is severely decreased.  Postbronchodilator there is no significant change in the FEV1 however clinical improvement may occur in the absence of spirometric improvement.  FEV1 FVC ratio is severely decreased.  Total lung capacity is increased.  Residual volume is increased.  Residual in total lung capacity ratio is increased.  Total gas volume is increased.  The DLCO is severely decreased.  IMPRESSION:  This pulmonary function study is consistent with severe obstructive lung disease.  There does not appear to be improvement after bronchodilators.  The DLCO is severely decreased.  Clinical correlation recommended  Allyne Gee, MD [facility] Pulmonary Critical Care Medicine Sleep Medicine

## 2019-01-31 ENCOUNTER — Encounter (INDEPENDENT_AMBULATORY_CARE_PROVIDER_SITE_OTHER): Payer: Self-pay | Admitting: Vascular Surgery

## 2019-01-31 ENCOUNTER — Ambulatory Visit (INDEPENDENT_AMBULATORY_CARE_PROVIDER_SITE_OTHER): Payer: BLUE CROSS/BLUE SHIELD | Admitting: Vascular Surgery

## 2019-01-31 ENCOUNTER — Other Ambulatory Visit: Payer: Self-pay

## 2019-01-31 ENCOUNTER — Ambulatory Visit (INDEPENDENT_AMBULATORY_CARE_PROVIDER_SITE_OTHER): Payer: BLUE CROSS/BLUE SHIELD

## 2019-01-31 VITALS — BP 106/74 | HR 108 | Resp 12 | Ht 58.5 in | Wt 112.0 lb

## 2019-01-31 DIAGNOSIS — Z79899 Other long term (current) drug therapy: Secondary | ICD-10-CM

## 2019-01-31 DIAGNOSIS — I739 Peripheral vascular disease, unspecified: Secondary | ICD-10-CM

## 2019-01-31 DIAGNOSIS — I10 Essential (primary) hypertension: Secondary | ICD-10-CM | POA: Diagnosis not present

## 2019-01-31 DIAGNOSIS — Z95820 Peripheral vascular angioplasty status with implants and grafts: Secondary | ICD-10-CM

## 2019-01-31 DIAGNOSIS — E1151 Type 2 diabetes mellitus with diabetic peripheral angiopathy without gangrene: Secondary | ICD-10-CM | POA: Diagnosis not present

## 2019-01-31 DIAGNOSIS — Z7984 Long term (current) use of oral hypoglycemic drugs: Secondary | ICD-10-CM

## 2019-01-31 DIAGNOSIS — Z87891 Personal history of nicotine dependence: Secondary | ICD-10-CM

## 2019-01-31 DIAGNOSIS — E785 Hyperlipidemia, unspecified: Secondary | ICD-10-CM

## 2019-01-31 NOTE — Assessment & Plan Note (Signed)
Her ABIs are stable at 1.03 on the right and 1.04 on the left with biphasic and triphasic waveforms distally. Doing well after revascularization.  Continue current medical regimen.  Plan to follow-up in 6 months with noninvasive studies and if they are stable at that time, we can likely go back to once a year checks.

## 2019-01-31 NOTE — Progress Notes (Signed)
MRN : [id number]  Angela Jensen is a 63 y.o. (1956-04-04) female who presents with chief complaint of  Chief Complaint  Patient presents with   Follow-up  .  History of Present Illness: Patient returns today in follow up of her PAD.  She has had multiple previous interventions in the past with the most recent being about 4 months ago on the left leg.  She is doing well today.  She has no lifestyle limiting claudication, ischemic rest pain, or ulceration at this time.  Her ABIs are stable at 1.03 on the right and 1.04 on the left with biphasic and triphasic waveforms distally.  Current Outpatient Medications  Medication Sig Dispense Refill   ACCU-CHEK FASTCLIX LANCETS MISC Use as directed twice a day diag E11.65 100 each 1   albuterol (PROVENTIL) (2.5 MG/3ML) 0.083% nebulizer solution Take 3 mLs (2.5 mg total) by nebulization every 6 (six) hours as needed for wheezing. 75 mL 3   amLODipine (NORVASC) 10 MG tablet Take 1 tablet (10 mg total) by mouth every morning. 30 tablet 5   aspirin EC 81 MG tablet Take 1 tablet (81 mg total) by mouth daily. 150 tablet 2   buPROPion (WELLBUTRIN XL) 150 MG 24 hr tablet TAKE 1 TABLET BY MOUTH TWICE DAILY FOR SMOKING CESSATION 60 tablet 6   clopidogrel (PLAVIX) 75 MG tablet Take 1 tablet (75 mg total) by mouth daily. 30 tablet 11   gabapentin (NEURONTIN) 100 MG capsule Take 1 capsule (100 mg total) by mouth 2 (two) times daily. 60 capsule 5   glucose blood (ACCU-CHEK AVIVA PLUS) test strip Use as instructed to check blood sugars three times daily.  E11.65 250 each 5   lisinopril-hydrochlorothiazide (PRINZIDE,ZESTORETIC) 10-12.5 MG tablet Take 1 tablet by mouth every morning. 30 tablet 5   meloxicam (MOBIC) 7.5 MG tablet Take 1 tablet (7.5 mg total) by mouth 2 (two) times daily. 60 tablet 3   metFORMIN (GLUCOPHAGE-XR) 500 MG 24 hr tablet Take 1 tablet (500 mg total) by mouth daily with breakfast. 30 tablet 3   mometasone-formoterol (DULERA) 200-5  MCG/ACT AERO Inhale 1 puff into the lungs 2 (two) times daily. Please fill as 90 day prescription 3 Inhaler 3   OXYGEN Inhale 2 L into the lungs at bedtime.     potassium chloride (K-DUR) 10 MEQ tablet TAKE 1 TABLET BY MOUTH EVERY OTHER DAY FOR  LOW  POTASSIUM 15 tablet 2   PROAIR HFA 108 (90 Base) MCG/ACT inhaler Inhale 2 puffs into the lungs every 4 (four) hours as needed for wheezing or shortness of breath. 9 each 6   rOPINIRole (REQUIP) 0.5 MG tablet Take 1 tablet (0.5 mg total) by mouth 2 (two) times daily. 180 tablet 0   rosuvastatin (CRESTOR) 10 MG tablet Take 1 tablet (10 mg total) by mouth at bedtime. 30 tablet 5   sitaGLIPtin (JANUVIA) 25 MG tablet Take 1 tablet (25 mg total) by mouth daily. 30 tablet 3   tiotropium (SPIRIVA HANDIHALER) 18 MCG inhalation capsule Place 1 capsule (18 mcg total) into inhaler and inhale daily. 90 capsule 2   No current facility-administered medications for this visit.     Past Medical History:  Diagnosis Date   Arthritis    Asthma    Atherosclerosis of native arteries of extremity with intermittent claudication ([place]) 05/26/2016   Cancer ([place]) 2012   Right Lung CA   COPD (chronic obstructive pulmonary disease) (HCC)    Depression    Diabetes mellitus  without complication [facility]    Patient takes [place] hypertension 05/26/2016   Hypercholesteremia    Hypertension    Oxygen dependent    2L at nite    PAD (peripheral artery disease) ([place]) 06/22/2016   Peripheral vascular disease (HCC)    Personal history of radiation therapy    Shortness of breath dyspnea    with exertion    Sleep apnea     Past Surgical History:  Procedure Laterality Date   CESAREAN SECTION     x3   COLONOSCOPY WITH PROPOFOL N/A 06/25/2015   Procedure: COLONOSCOPY WITH PROPOFOL;  Surgeon: Lucilla Lame, MD;  Location: [facility] ENDOSCOPY;  Service: Endoscopy;  Laterality: N/A;   CYST REMOVAL LEG     and on shoulder    LOWER EXTREMITY  ANGIOGRAPHY Left 09/29/2018   Procedure: LOWER EXTREMITY ANGIOGRAPHY;  Surgeon: Algernon Huxley, MD;  Location: [place] [facility];  Service: Cardiovascular;  Laterality: Left;   LUNG BIOPSY  05 15 2013   has lung "spots"   PERIPHERAL VASCULAR CATHETERIZATION Left 06/01/2016   Procedure: Lower Extremity Angiography;  Surgeon: Algernon Huxley, MD;  Location: [place] [facility];  Service: Cardiovascular;  Laterality: Left;   PERIPHERAL VASCULAR CATHETERIZATION N/A 06/01/2016   Procedure: Abdominal Aortogram w/Lower Extremity;  Surgeon: Algernon Huxley, MD;  Location: [place] [facility];  Service: Cardiovascular;  Laterality: N/A;   PERIPHERAL VASCULAR CATHETERIZATION  06/01/2016   Procedure: Lower Extremity Intervention;  Surgeon: Algernon Huxley, MD;  Location: [place] [facility];  Service: Cardiovascular;;   PERIPHERAL VASCULAR CATHETERIZATION Right 06/08/2016   Procedure: Lower Extremity Angiography;  Surgeon: Algernon Huxley, MD;  Location: [place] [facility];  Service: Cardiovascular;  Laterality: Right;   PERIPHERAL VASCULAR CATHETERIZATION  06/08/2016   Procedure: Lower Extremity Intervention;  Surgeon: Algernon Huxley, MD;  Location: [place] [facility];  Service: Cardiovascular;;     Family History  Problem Relation Age of Onset   Diabetes Mother    Lung cancer Father    Diabetes Sister    Diabetes Maternal Grandmother    Diabetes Paternal Grandmother    Diabetes Sister             Social History  Substance Use Topics   Smoking status: Former Smoker    Packs/day: 1.00    Years: 30.00    Types: Cigarettes    Quit date: 02/06/2010   Smokeless tobacco: Former Systems developer    Types: Snuff   Alcohol use No     Comment: hx of etoh use   No IV drug use  No Known Allergies     REVIEW OF SYSTEMS(Negative unless checked)  Constitutional: [] ??Weight loss[] ??Fever[] ??Chills Cardiac:[] ??Chest pain[] ??Chest pressure[] ??Palpitations  [] ??Shortness of breath when laying flat [] ??Shortness of breath at rest [x] ??Shortness of breath with exertion. Vascular: [x] ??Pain in legs with walking[] ??Pain in legsat rest[] ??Pain in legs when laying flat [x] ??Claudication [] ??Pain in feet when walking [] ??Pain in feet at rest [] ??Pain in feet when laying flat [] ??History of DVT [] ??Phlebitis [] ??Swelling in legs [] ??Varicose veins [] ??Non-healing ulcers Pulmonary: [x] ??Uses home oxygen [] ??Productive cough[] ??Hemoptysis [] ??Wheeze [x] ??COPD [] ??Asthma Neurologic: [] ??Dizziness [] ??Blackouts [] ??Seizures [] ??History of stroke [] ??History of TIA[] ??Aphasia [] ??Temporary blindness[] ??Dysphagia [] ??Weaknessor numbness in arms [] ??Weakness or numbnessin legs Musculoskeletal: [x] ??Arthritis [] ??Joint swelling [] ??Joint pain [] ??Low back pain Hematologic:[] ??Easy bruising[] ??Easy bleeding [] ??Hypercoagulable state [] ??Anemic [] ??Hepatitis Gastrointestinal:[] ??Blood in stool[] ??Vomiting blood[] ??Gastroesophageal reflux/heartburn[] ??Abdominal pain Genitourinary: [] ??Chronic kidney disease [] ??Difficulturination [] ??Frequenturination [] ??Burning with urination[] ??Hematuria Skin: [] ??Rashes [] ??Ulcers [] ??Wounds Psychological: [] ??History of anxiety[] ??History of major depression.  Physical Examination  BP 106/74 (BP Location: Left Arm, Patient Position: Sitting, Cuff Size: Normal)    Pulse (!) 108    Resp 12    Ht 4' 10.5" (1.486 m)    Wt 112 lb (50.8 kg)    BMI 23.01 kg/m  Gen:  WD/WN, NAD Head: [place]/AT, No temporalis wasting. Ear/Nose/Throat: Hearing grossly intact, nares w/o erythema or drainage Eyes: Conjunctiva clear. Sclera non-icteric Neck: Supple.  Trachea midline Pulmonary:  Good air movement, no use of accessory muscles.  Cardiac: tachycardic Vascular:  Vessel Right Left  Radial Palpable Palpable                           PT Palpable Palpable  DP Palpable Palpable   Gastrointestinal: soft, non-tender/non-distended. No guarding/reflex.  Musculoskeletal: M/S 5/5 throughout.  No deformity or atrophy. No edema. Neurologic: Sensation grossly intact in extremities.  Symmetrical.  Speech is fluent.  Psychiatric: Judgment intact, Jensen & affect appropriate for pt's clinical situation. Dermatologic: No rashes or ulcers noted.  No cellulitis or open wounds.       Labs Recent Results (from the past 2160 hour(s))  POCT HgB A1C     Status: Abnormal   Collection Time: 01/02/19  9:53 AM  Result Value Ref Range   Hemoglobin A1C 5.7 (A) 4.0 - 5.6 %   HbA1c POC (<> result, manual entry)     HbA1c, POC (prediabetic range)     HbA1c, POC (controlled diabetic range)    Comprehensive metabolic panel     Status: Abnormal   Collection Time: 01/19/19  2:00 PM  Result Value Ref Range   Sodium 135 135 - 145 mmol/L   Potassium 3.9 3.5 - 5.1 mmol/L   Chloride 100 98 - 111 mmol/L   CO2 25 22 - 32 mmol/L   Glucose, Bld 113 (H) 70 - 99 mg/dL   BUN 19 8 - 23 mg/dL   Creatinine, Ser 0.80 0.44 - 1.00 mg/dL   Calcium 9.2 8.9 - 10.3 mg/dL   Total Protein 8.3 (H) 6.5 - 8.1 g/dL   Albumin 4.3 3.5 - 5.0 g/dL   AST 26 15 - 41 U/L   ALT 22 0 - 44 U/L   Alkaline Phosphatase 78 38 - 126 U/L   Total Bilirubin 0.2 (L) 0.3 - 1.2 mg/dL   GFR calc non Af Amer >60 >60 mL/min   GFR calc Af Amer >60 >60 mL/min   Anion gap 10 5 - 15    Comment: Performed at [facility], [address] [place], [place] [address]  CEA     Status: Abnormal   Collection Time: 01/19/19  2:00 PM  Result Value Ref Range   CEA 7.9 (H) 0.0 - 4.7 ng/mL    Comment: (NOTE)                             Nonsmokers          <3.9                             Smokers             <5.6 [employer] Electrochemiluminescence Immunoassay (ECLIA) Values obtained with different assay methods or kits cannot be used interchangeably.  Results cannot  be interpreted as absolute evidence of the presence or absence of malignant disease. Performed At:  [facility] [place], [place] [id number] Rush Farmer MD WU:[id number]   CBC with Differential/Platelet     Status: None   Collection Time: 01/19/19  2:00 PM  Result Value Ref Range   WBC 6.0 4.0 - 10.5 K/uL   RBC 4.28 3.87 - 5.11 MIL/uL   Hemoglobin 12.3 12.0 - 15.0 g/dL   HCT 38.5 36.0 - 46.0 %   MCV 90.0 80.0 - 100.0 fL   MCH 28.7 26.0 - 34.0 pg   MCHC 31.9 30.0 - 36.0 g/dL   RDW 14.9 11.5 - 15.5 %   Platelets 241 150 - 400 K/uL   nRBC 0.0 0.0 - 0.2 %   Neutrophils Relative % 68 %   Neutro Abs 4.1 1.7 - 7.7 K/uL   Lymphocytes Relative 20 %   Lymphs Abs 1.2 0.7 - 4.0 K/uL   Monocytes Relative 9 %   Monocytes Absolute 0.6 0.1 - 1.0 K/uL   Eosinophils Relative 2 %   Eosinophils Absolute 0.1 0.0 - 0.5 K/uL   Basophils Relative 1 %   Basophils Absolute 0.0 0.0 - 0.1 K/uL   Immature Granulocytes 0 %   Abs Immature Granulocytes 0.02 0.00 - 0.07 K/uL    Comment: Performed at [facility], [address] [place], [place] [address]  Pulmonary Function Test     Status: None   Collection Time: 01/25/19 11:00 AM  Result Value Ref Range   FEV1     FVC     FEV1/FVC     TLC     DLCO      Radiology Ct Chest Wo Contrast  Result Date: 01/17/2019 CLINICAL DATA:  RIGHT lung carcinoma diagnosed 2012. Recurrence in June 2019. Radiation treatment completed August 2019. EXAM: CT CHEST WITHOUT CONTRAST TECHNIQUE: Multidetector CT imaging of the chest was performed following the standard protocol without IV contrast. COMPARISON:  No significant vascular findings. Normal heart size. No pericardial effusion. FINDINGS: Cardiovascular: No significant vascular findings. Normal heart size. No pericardial effusion. Mediastinum/Nodes: No axillary or supraclavicular adenopathy. No mediastinal hilar adenopathy. No pericardial effusion. Esophagus normal. Lungs/Pleura:  Stable pulmonary scarring in the RIGHT upper lobe. Band of scarring in the RIGHT middle lobe also unchanged. Decreased subpleural reticulation in the RIGHT lower lobe at treatment site. Minimal residual nodularity along the oblique fissure at the treatment site (image 82/3). Centrilobular emphysema. Upper Abdomen: Limited view of the liver, kidneys, pancreas are unremarkable. Normal adrenal glands. Musculoskeletal: No aggressive osseous lesion. IMPRESSION: 1. Decreased subpleural reticulation at the SBRT site in the RIGHT lower lobe consistent with improved radiation change. There is residual nodularity along the fissure at this site. Recommend close attention on follow-up. 2. Stable pulmonary scarring in the RIGHT upper lobe and RIGHT middle lobe. 3. Centrilobular emphysema. Electronically Signed   By: Suzy Bouchard M.D.   On: 01/17/2019 08:35    Assessment/Plan Essential hypertension blood pressure control important in reducing the progression of atherosclerotic disease. On appropriate oral medications.   Diabetes ([place]) blood glucose control important in reducing the progression of atherosclerotic disease. Also, involved in wound healing. On appropriate medications.   Hyperlipidemia lipid control important in reducing the progression of atherosclerotic disease. Continue statin therapy  PAD (peripheral artery disease) (HCC) Her ABIs are stable at 1.03 on the right and 1.04 on the left with biphasic and triphasic waveforms distally. Doing well after revascularization.  Continue current medical regimen.  Plan to follow-up in 6 months with noninvasive studies and if they are  stable at that time, we can likely go back to once a year checks.    [name], MD  01/31/2019 12:04 PM    This note was created with Dragon medical transcription system.  Any errors from dictation are purely unintentional

## 2019-02-01 ENCOUNTER — Other Ambulatory Visit: Payer: Self-pay

## 2019-02-02 ENCOUNTER — Other Ambulatory Visit: Payer: Self-pay

## 2019-02-02 ENCOUNTER — Ambulatory Visit
Admission: RE | Admit: 2019-02-02 | Discharge: 2019-02-02 | Disposition: A | Discharge status: Home / Self Care | Payer: BLUE CROSS/BLUE SHIELD | Source: Ambulatory Visit | Attending: Radiation Oncology | Admitting: Radiation Oncology

## 2019-02-02 ENCOUNTER — Encounter: Payer: Self-pay | Admitting: Radiation Oncology

## 2019-02-02 VITALS — BP 132/88 | HR 98 | Temp 97.6°F | Resp 16 | Wt 117.4 lb

## 2019-02-02 DIAGNOSIS — Z923 Personal history of irradiation: Secondary | ICD-10-CM | POA: Insufficient documentation

## 2019-02-02 DIAGNOSIS — Z87891 Personal history of nicotine dependence: Secondary | ICD-10-CM | POA: Insufficient documentation

## 2019-02-02 DIAGNOSIS — C3431 Malignant neoplasm of lower lobe, right bronchus or lung: Secondary | ICD-10-CM | POA: Diagnosis not present

## 2019-02-02 NOTE — Progress Notes (Signed)
Radiation Oncology Follow up Note  Name: Angela Jensen   Date:   02/02/2019 MRN:  [id number] DOB: 1956-05-18    This 63 y.o. female presents to the clinic today for 83-month follow-up status post SBRT to her right lower lobe.  For stage I adenocarcinoma  REFERRING PROVIDER: Ronnell Freshwater, NP  HPI: Patient is a 63 year old female now at 10 months having completed SBRT to her right lower lobe for stage I adenocarcinoma seen today in routine follow-up she is doing well.  She specifically denies cough hemoptysis or chest tightness..  She had a recent CT scan showing decreased subpleural reticulation at the SBRT site in the right lower lobe.  There is some residual nodularity along the fissure at the site which I have recommended not that impressed.  COMPLICATIONS OF TREATMENT: none  FOLLOW UP COMPLIANCE: keeps appointments   PHYSICAL EXAM:  BP 132/88 (BP Location: Left Arm, Patient Position: Sitting)   Pulse 98   Temp 97.6 F (36.4 C) (Tympanic)   Resp 16   Wt 117 lb 6.3 oz (53.3 kg)   BMI 24.12 kg/m  Well-developed well-nourished patient in NAD. HEENT reveals PERLA, EOMI, discs not visualized.  Oral cavity is clear. No oral mucosal lesions are identified. Neck is clear without evidence of cervical or supraclavicular adenopathy. Lungs are clear to A&P. Cardiac examination is essentially unremarkable with regular rate and rhythm without murmur rub or thrill. Abdomen is benign with no organomegaly or masses noted. Motor sensory and DTR levels are equal and symmetric in the upper and lower extremities. Cranial nerves II through XII are grossly intact. Proprioception is intact. No peripheral adenopathy or edema is identified. No motor or sensory levels are noted. Crude visual fields are within normal range.  RADIOLOGY RESULTS: CT scans reviewed compatible with above-stated findings  PLAN: Present time patient is doing well with excellent results by CT criteria at 10 months out.  I have asked  to see her back in 6 months with a CT scan of the chest with contrast prior to that visit.  Patient knows to call with any concerns at any time.  I would like to take this opportunity to thank you for allowing me to participate in the care of your patient.Noreene Filbert, MD

## 2019-02-03 ENCOUNTER — Other Ambulatory Visit: Payer: Self-pay

## 2019-02-03 DIAGNOSIS — E1165 Type 2 diabetes mellitus with hyperglycemia: Secondary | ICD-10-CM

## 2019-02-03 MED ORDER — ROPINIROLE HCL 0.5 MG PO TABS: 0.5000 mg | ORAL_TABLET | Freq: Two times a day (BID) | ORAL | 0 refills | Status: DC

## 2019-02-03 MED ORDER — METFORMIN HCL ER 500 MG PO TB24: 500.0000 mg | ORAL_TABLET | Freq: Every day | ORAL | 3 refills | Status: DC

## 2019-02-16 ENCOUNTER — Inpatient Hospital Stay: Payer: BLUE CROSS/BLUE SHIELD | Attending: Hematology and Oncology

## 2019-02-16 ENCOUNTER — Other Ambulatory Visit: Payer: Self-pay

## 2019-02-16 DIAGNOSIS — C3431 Malignant neoplasm of lower lobe, right bronchus or lung: Secondary | ICD-10-CM | POA: Insufficient documentation

## 2019-02-16 DIAGNOSIS — C3411 Malignant neoplasm of upper lobe, right bronchus or lung: Secondary | ICD-10-CM | POA: Diagnosis present

## 2019-02-17 LAB — KAPPA/LAMBDA LIGHT CHAINS
Kappa free light chain: 17.8 mg/L (ref 3.3–19.4)
Kappa, lambda light chain ratio: 1.08 (ref 0.26–1.65)
Lambda free light chains: 16.5 mg/L (ref 5.7–26.3)

## 2019-02-20 LAB — MULTIPLE MYELOMA PANEL, SERUM
Albumin SerPl Elph-Mcnc: 3.8 g/dL (ref 2.9–4.4)
Albumin/Glob SerPl: 1.3 (ref 0.7–1.7)
Alpha 1: 0.2 g/dL (ref 0.0–0.4)
Alpha2 Glob SerPl Elph-Mcnc: 0.9 g/dL (ref 0.4–1.0)
B-Globulin SerPl Elph-Mcnc: 1.1 g/dL (ref 0.7–1.3)
Gamma Glob SerPl Elph-Mcnc: 0.9 g/dL (ref 0.4–1.8)
Globulin, Total: 3.1 g/dL (ref 2.2–3.9)
IgA: 150 mg/dL (ref 87–352)
IgG (Immunoglobin G), Serum: 980 mg/dL (ref 586–1602)
IgM (Immunoglobulin M), Srm: 126 mg/dL (ref 26–217)
Total Protein ELP: 6.9 g/dL (ref 6.0–8.5)

## 2019-03-15 ENCOUNTER — Other Ambulatory Visit: Payer: Self-pay | Admitting: Nurse Practitioner

## 2019-03-15 MED ORDER — ACCU-CHEK FASTCLIX LANCETS MISC: 1 refills | Status: DC

## 2019-03-15 MED ORDER — ACCU-CHEK AVIVA PLUS VI STRP: ORAL_STRIP | 5 refills | Status: DC

## 2019-03-16 ENCOUNTER — Other Ambulatory Visit: Payer: Self-pay

## 2019-03-16 MED ORDER — ACCU-CHEK AVIVA PLUS VI STRP: ORAL_STRIP | 5 refills | Status: DC

## 2019-03-21 ENCOUNTER — Other Ambulatory Visit: Payer: Self-pay

## 2019-03-21 DIAGNOSIS — E1165 Type 2 diabetes mellitus with hyperglycemia: Secondary | ICD-10-CM

## 2019-03-21 MED ORDER — SITAGLIPTIN PHOSPHATE 25 MG PO TABS: 25.0000 mg | ORAL_TABLET | Freq: Every day | ORAL | 3 refills | Status: DC

## 2019-03-27 ENCOUNTER — Other Ambulatory Visit: Payer: Self-pay | Admitting: Adult Health

## 2019-03-27 DIAGNOSIS — I10 Essential (primary) hypertension: Secondary | ICD-10-CM

## 2019-03-27 MED ORDER — POTASSIUM CHLORIDE ER 10 MEQ PO TBCR: EXTENDED_RELEASE_TABLET | ORAL | 3 refills | Status: DC

## 2019-04-03 ENCOUNTER — Other Ambulatory Visit: Payer: Self-pay | Admitting: Nurse Practitioner

## 2019-04-03 MED ORDER — ALBUTEROL SULFATE (2.5 MG/3ML) 0.083% IN NEBU: 2.5000 mg | INHALATION_SOLUTION | Freq: Four times a day (QID) | RESPIRATORY_TRACT | 3 refills | Status: DC | PRN

## 2019-04-25 ENCOUNTER — Other Ambulatory Visit: Payer: Self-pay | Admitting: Nurse Practitioner

## 2019-04-25 DIAGNOSIS — E1165 Type 2 diabetes mellitus with hyperglycemia: Secondary | ICD-10-CM

## 2019-04-25 MED ORDER — ROSUVASTATIN CALCIUM 10 MG PO TABS: 10.0000 mg | ORAL_TABLET | Freq: Every day | ORAL | 5 refills | Status: DC

## 2019-04-25 MED ORDER — METFORMIN HCL ER 500 MG PO TB24: 500.0000 mg | ORAL_TABLET | Freq: Every day | ORAL | 3 refills | Status: DC

## 2019-05-04 ENCOUNTER — Other Ambulatory Visit: Payer: Self-pay

## 2019-05-04 ENCOUNTER — Encounter: Payer: Self-pay | Admitting: Internal Medicine

## 2019-05-04 ENCOUNTER — Ambulatory Visit: Payer: BLUE CROSS/BLUE SHIELD | Admitting: Internal Medicine

## 2019-05-04 VITALS — BP 142/82 | HR 97 | Resp 16 | Ht 59.0 in | Wt 123.0 lb

## 2019-05-04 DIAGNOSIS — J9611 Chronic respiratory failure with hypoxia: Secondary | ICD-10-CM | POA: Diagnosis not present

## 2019-05-04 DIAGNOSIS — Z9981 Dependence on supplemental oxygen: Secondary | ICD-10-CM | POA: Diagnosis not present

## 2019-05-04 DIAGNOSIS — I10 Essential (primary) hypertension: Secondary | ICD-10-CM

## 2019-05-04 DIAGNOSIS — R0602 Shortness of breath: Secondary | ICD-10-CM | POA: Diagnosis not present

## 2019-05-04 DIAGNOSIS — J449 Chronic obstructive pulmonary disease, unspecified: Secondary | ICD-10-CM

## 2019-05-04 MED ORDER — ALBUTEROL SULFATE HFA 108 (90 BASE) MCG/ACT IN AERS: 2.0000 | INHALATION_SPRAY | RESPIRATORY_TRACT | 3 refills | Status: DC | PRN

## 2019-05-04 NOTE — Progress Notes (Signed)
[facility] [place], [place] [address]  Pulmonary Sleep Medicine   Office Visit Note  Patient Name: Angela Jensen DOB: [birth year] MRN [id number]  Date of Service: 05/04/2019  Complaints/HPI: Patient here for pulmonary follow-up, no acute issues to report. Wears 2L [place] at night, compliant with wearing every night, no issues. Denies cough, shortness of breath, headache or hemoptysis. She uses her nebulizer once a day, and uses dulera, spiriva regularly. She uses her rescue inhaler as needed, and states she needs it daily at times, but can go without it for a week at times.   ROS  General: (-) fever, (-) chills, (-) night sweats, (-) weakness Skin: (-) rashes, (-) itching,. Eyes: (-) visual changes, (-) redness, (-) itching. Nose and Sinuses: (-) nasal stuffiness or itchiness, (-) postnasal drip, (-) nosebleeds, (-) sinus trouble. Mouth and Throat: (-) sore throat, (-) hoarseness. Neck: (-) swollen glands, (-) enlarged thyroid, (-) neck pain. Respiratory:  (-) cough, (-) bloody sputum, (-) shortness of breath, (-) wheezing. Cardiovascular: - ankle swelling, (-) chest pain. Lymphatic: (-) lymph node enlargement. Neurologic: (-) numbness, (-) tingling. Psychiatric: (-) anxiety, (-) depression   Current Medication: Outpatient Encounter Medications as of 05/04/2019  Medication Sig  . Accu-Chek FastClix Lancets MISC Use as directed twice a day diag E11.65  . albuterol (PROVENTIL) (2.5 MG/3ML) 0.083% nebulizer solution Take 3 mLs (2.5 mg total) by nebulization every 6 (six) hours as needed for wheezing.  Marland Kitchen amLODipine (NORVASC) 10 MG tablet Take 1 tablet (10 mg total) by mouth every morning.  Marland Kitchen aspirin EC 81 MG tablet Take 1 tablet (81 mg total) by mouth daily.  Marland Kitchen buPROPion (WELLBUTRIN XL) 150 MG 24 hr tablet TAKE 1 TABLET BY MOUTH TWICE DAILY FOR SMOKING CESSATION  . clopidogrel (PLAVIX) 75 MG tablet Take 1 tablet (75 mg total) by mouth daily.  Marland Kitchen gabapentin  (NEURONTIN) 100 MG capsule Take 1 capsule (100 mg total) by mouth 2 (two) times daily.  Marland Kitchen glucose blood (ACCU-CHEK AVIVA PLUS) test strip Use as instructed to check blood sugars three times daily.  E11.65  . lisinopril-hydrochlorothiazide (PRINZIDE,ZESTORETIC) 10-12.5 MG tablet Take 1 tablet by mouth every morning.  . meloxicam (MOBIC) 7.5 MG tablet Take 1 tablet (7.5 mg total) by mouth 2 (two) times daily.  . metFORMIN (GLUCOPHAGE-XR) 500 MG 24 hr tablet Take 1 tablet (500 mg total) by mouth daily with breakfast.  . mometasone-formoterol (DULERA) 200-5 MCG/ACT AERO Inhale 1 puff into the lungs 2 (two) times daily. Please fill as 90 day prescription  . OXYGEN Inhale 2 L into the lungs at bedtime.  . potassium chloride (K-DUR) 10 MEQ tablet TAKE 1 TABLET BY MOUTH EVERY OTHER DAY FOR  LOW  POTASSIUM  . PROAIR HFA 108 (90 Base) MCG/ACT inhaler Inhale 2 puffs into the lungs every 4 (four) hours as needed for wheezing or shortness of breath.  Marland Kitchen rOPINIRole (REQUIP) 0.5 MG tablet Take 1 tablet (0.5 mg total) by mouth 2 (two) times daily.  . rosuvastatin (CRESTOR) 10 MG tablet Take 1 tablet (10 mg total) by mouth at bedtime.  . sitaGLIPtin (JANUVIA) 25 MG tablet Take 1 tablet (25 mg total) by mouth daily.  Marland Kitchen tiotropium (SPIRIVA HANDIHALER) 18 MCG inhalation capsule Place 1 capsule (18 mcg total) into inhaler and inhale daily.   No facility-administered encounter medications on file as of 05/04/2019.     Surgical History: Past Surgical History:  Procedure Laterality Date  . CESAREAN SECTION  x3  . COLONOSCOPY WITH PROPOFOL N/A 06/25/2015   Procedure: COLONOSCOPY WITH PROPOFOL;  Surgeon: Lucilla Lame, MD;  Location: [facility] ENDOSCOPY;  Service: Endoscopy;  Laterality: N/A;  . CYST REMOVAL LEG     and on shoulder   . LOWER EXTREMITY ANGIOGRAPHY Left 09/29/2018   Procedure: LOWER EXTREMITY ANGIOGRAPHY;  Surgeon: Algernon Huxley, MD;  Location: [place] [facility];  Service: Cardiovascular;  Laterality:  Left;  . LUNG BIOPSY  05 15 2013   has lung "spots"  . PERIPHERAL VASCULAR CATHETERIZATION Left 06/01/2016   Procedure: Lower Extremity Angiography;  Surgeon: Algernon Huxley, MD;  Location: [place] [facility];  Service: Cardiovascular;  Laterality: Left;  . PERIPHERAL VASCULAR CATHETERIZATION N/A 06/01/2016   Procedure: Abdominal Aortogram w/Lower Extremity;  Surgeon: Algernon Huxley, MD;  Location: [place] [facility];  Service: Cardiovascular;  Laterality: N/A;  . PERIPHERAL VASCULAR CATHETERIZATION  06/01/2016   Procedure: Lower Extremity Intervention;  Surgeon: Algernon Huxley, MD;  Location: [place] [facility];  Service: Cardiovascular;;  . PERIPHERAL VASCULAR CATHETERIZATION Right 06/08/2016   Procedure: Lower Extremity Angiography;  Surgeon: Algernon Huxley, MD;  Location: [place] [facility];  Service: Cardiovascular;  Laterality: Right;  . PERIPHERAL VASCULAR CATHETERIZATION  06/08/2016   Procedure: Lower Extremity Intervention;  Surgeon: Algernon Huxley, MD;  Location: [place] [facility];  Service: Cardiovascular;;    Medical History: Past Medical History:  Diagnosis Date  . Arthritis   . Asthma   . Atherosclerosis of native arteries of extremity with intermittent claudication ([place]) 05/26/2016  . Cancer [facility]) 2012   Right Lung CA  . COPD (chronic obstructive pulmonary disease) ([place])   . Depression   . Diabetes mellitus without complication [facility])    Patient takes Janumet  . Essential hypertension 05/26/2016  . Hypercholesteremia   . Hypertension   . 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 PAD (peripheral artery disease) ([place]) 06/22/2016  . Peripheral vascular disease ([place])   . Personal history of radiation therapy   . Shortness of breath dyspnea    with exertion   . Sleep apnea     Family History: Family History  Problem Relation Age of Onset  . Lung cancer Father   . Diabetes Mother   . Hypercholesterolemia Mother   . Diabetes Sister   . Diabetes Maternal Grandmother   .  Diabetes Paternal Grandmother   . Diabetes Sister   . Heart attack Brother   . Coronary artery disease Brother   . Vascular Disease Brother   . Hypertension Sister   . Heart attack Brother     Social History: Social History   Socioeconomic History  . Marital status: Widowed    Spouse name: Not on file  . Number of children: Not on file  . Years of education: Not on file  . Highest education level: Not on file  Occupational History  . Not on file  Social Needs  . Financial resource strain: Not on file  . Food insecurity    Worry: Not on file    Inability: Not on file  . Transportation needs    Medical: Not on file    Non-medical: Not on file  Tobacco Use  . Smoking status: Former Smoker    Packs/day: 1.00    Years: 37.00    Pack years: 37.00    Types: Cigarettes    Quit date: 02/06/2010    Years since quitting: 9.2  . Smokeless  tobacco: Former Systems developer    Types: Snuff  Substance and Sexual Activity  . Alcohol use: Yes    Alcohol/week: 5.0 standard drinks    Types: 5 Cans of beer per week    Comment: /h x of alcohol abuse -stopped 2012- now drinks 5 beer per w  . Drug use: Yes    Types: Marijuana, "Crack" cocaine, Cocaine    Comment: hx of cocaine use- last use 2015; last use sat marijuana6/22/19,   . Sexual activity: Yes  Lifestyle  . Physical activity    Days per week: Not on file    Minutes per session: Not on file  . Stress: Not on file  Relationships  . Social Herbalist on phone: Not on file    Gets together: Not on file    Attends religious service: Not on file    Active member of club or organization: Not on file    Attends meetings of clubs or organizations: Not on file    Relationship status: Not on file  . Intimate partner violence    Fear of current or ex partner: Not on file    Emotionally abused: Not on file    Physically abused: Not on file    Forced sexual activity: Not on file  Other Topics Concern  . Not on file  Social History  Narrative  . Not on file    Vital Signs: Blood pressure (!) 142/82, pulse 97, resp. rate 16, height 4' 11" (1.499 m), weight 123 lb (55.8 kg), SpO2 95 %.  Examination: General Appearance: The patient is well-developed, well-nourished, and in no distress. Skin: Gross inspection of skin unremarkable. Head: normocephalic, no gross deformities. Eyes: no gross deformities noted. ENT: ears appear grossly normal no exudates. Neck: Supple. No thyromegaly. No LAD. Respiratory: clear bilaterally. Cardiovascular: Normal S1 and S2 without murmur or rub. Extremities: No cyanosis. pulses are equal. Neurologic: Alert and oriented. No involuntary movements.  LABS: Recent Results (from the past 2160 hour(s))  Multiple Myeloma Panel (SPEP&IFE w/QIG)     Status: None   Collection Time: 02/16/19  8:48 AM  Result Value Ref Range   IgG (Immunoglobin G), Serum 980 586 - 1,602 mg/dL   IgA 150 87 - 352 mg/dL   IgM (Immunoglobulin M), Srm 126 26 - 217 mg/dL   Total Protein ELP 6.9 6.0 - 8.5 g/dL   Albumin SerPl Elph-Mcnc 3.8 2.9 - 4.4 g/dL   Alpha 1 0.2 0.0 - 0.4 g/dL   Alpha2 Glob SerPl Elph-Mcnc 0.9 0.4 - 1.0 g/dL   B-Globulin SerPl Elph-Mcnc 1.1 0.7 - 1.3 g/dL   Gamma Glob SerPl Elph-Mcnc 0.9 0.4 - 1.8 g/dL   M Protein SerPl Elph-Mcnc Not Observed Not Observed g/dL   Globulin, Total 3.1 2.2 - 3.9 g/dL   Albumin/Glob SerPl 1.3 0.7 - 1.7   IFE 1 Comment     Comment: (NOTE) The immunofixation pattern appears unremarkable. Evidence of monoclonal protein is not apparent.    Please Note Comment     Comment: (NOTE) Protein electrophoresis scan will follow via computer, mail, or courier delivery. Performed At: [facility] [place], [place] [id number] Rush Farmer MD [id number]   Kappa/lambda light chains     Status: None   Collection Time: 02/16/19  8:48 AM  Result Value Ref Range   Kappa free light chain 17.8 3.3 - 19.4 mg/L   Lamda free light chains 16.5 5.7 - 26.3  mg/L   Kappa, lamda  light chain ratio 1.08 0.26 - 1.65    Comment: (NOTE) Performed At: [facility] [place], [place] [id number] Rush Farmer MD [id number]     Radiology: No results found.  No results found.  No results found.    Assessment and Plan: Patient Active Problem List   Diagnosis Date Noted  . Elevated total protein 01/22/2019  . Uncontrolled type 2 diabetes mellitus with hyperglycemia ([place]) 05/16/2018  . Chronic obstructive pulmonary disease ([place]) 05/16/2018  . Vitamin D deficiency 05/16/2018  . Flu vaccine need 05/16/2018  . Dysuria 05/16/2018  . Cancer of lower lobe of right lung ([place]) 01/21/2018  . Screening for breast cancer 10/12/2017  . Personal history of tobacco use, presenting hazards to health 02/03/2017  . PAD (peripheral artery disease) ([place]) 06/22/2016  . Essential hypertension 05/26/2016  . Diabetes ([place]) 05/26/2016  . Hyperlipidemia 05/26/2016  . Atherosclerosis of native arteries of extremity with intermittent claudication ([place]) 05/26/2016  . Uterine leiomyoma 04/30/2016  . Elevated CEA 01/31/2016  . Special screening for malignant neoplasms, colon   . Benign neoplasm of sigmoid colon   . Primary lung cancer ([place])   . Malignant neoplasm of upper lobe of right lung ([place])     1. Chronic obstructive pulmonary disease, unspecified COPD type ([place]) Controlled, continue present management.  Continue to use inhalers.   2. Essential hypertension Stable, ate sausage this morning.  bp 142/82, and she just took her bp meds.   3. Chronic respiratory failure with hypoxia ([facility]) Stable, continue to use oxygen and inhalers as directed.   4. Oxygen dependent Continue to use oxygen at 2 lpm.   5. SOB (shortness of breath) FEV1 is 0.6 which is 36% of pre-predicted value - Spirometry with Graph  General Counseling: I have discussed the findings of the evaluation and examination with [facility].  I have also discussed any further  diagnostic evaluation thatmay be needed or ordered today. Auda verbalizes understanding of the findings of todays visit. We also reviewed her medications today and discussed drug interactions and side effects including but not limited excessive drowsiness and altered mental states. We also discussed that there is always a risk not just to her but also people around her. she has been encouraged to call the office with any questions or concerns that should arise related to todays visit.    Time spent: 25 This patient was seen by Orson Gear AGNP-C in Collaboration with Dr. Devona Konig as a part of collaborative care agreement.   I have personally obtained a history, examined the patient, evaluated laboratory and imaging results, formulated the assessment and plan and placed orders.    Allyne Gee, MD [facility] Pulmonary and Critical Care Sleep medicine

## 2019-05-16 ENCOUNTER — Encounter: Payer: BLUE CROSS/BLUE SHIELD | Admitting: Nurse Practitioner

## 2019-05-18 ENCOUNTER — Other Ambulatory Visit: Payer: Self-pay | Admitting: Internal Medicine

## 2019-05-18 ENCOUNTER — Other Ambulatory Visit: Payer: Self-pay | Admitting: Nurse Practitioner

## 2019-05-18 DIAGNOSIS — Z1231 Encounter for screening mammogram for malignant neoplasm of breast: Secondary | ICD-10-CM

## 2019-05-19 ENCOUNTER — Other Ambulatory Visit: Payer: Self-pay

## 2019-05-19 MED ORDER — AMLODIPINE BESYLATE 10 MG PO TABS: 10.0000 mg | ORAL_TABLET | Freq: Every morning | ORAL | 5 refills | Status: DC

## 2019-05-19 MED ORDER — ROPINIROLE HCL 0.5 MG PO TABS: 0.5000 mg | ORAL_TABLET | Freq: Two times a day (BID) | ORAL | 0 refills | Status: DC

## 2019-05-19 MED ORDER — BUPROPION HCL ER (XL) 150 MG PO TB24: ORAL_TABLET | ORAL | 6 refills | Status: DC

## 2019-05-19 MED ORDER — MELOXICAM 7.5 MG PO TABS: 7.5000 mg | ORAL_TABLET | Freq: Two times a day (BID) | ORAL | 3 refills | Status: DC

## 2019-06-23 ENCOUNTER — Other Ambulatory Visit: Payer: Self-pay

## 2019-06-23 ENCOUNTER — Ambulatory Visit (INDEPENDENT_AMBULATORY_CARE_PROVIDER_SITE_OTHER): Payer: BLUE CROSS/BLUE SHIELD | Admitting: Nurse Practitioner

## 2019-06-23 ENCOUNTER — Encounter: Payer: Self-pay | Admitting: Nurse Practitioner

## 2019-06-23 VITALS — BP 123/76 | HR 99 | Temp 97.5°F | Resp 16 | Ht 59.0 in | Wt 122.0 lb

## 2019-06-23 DIAGNOSIS — E1165 Type 2 diabetes mellitus with hyperglycemia: Secondary | ICD-10-CM

## 2019-06-23 DIAGNOSIS — Z0001 Encounter for general adult medical examination with abnormal findings: Secondary | ICD-10-CM | POA: Diagnosis not present

## 2019-06-23 DIAGNOSIS — R300 Dysuria: Secondary | ICD-10-CM

## 2019-06-23 DIAGNOSIS — J449 Chronic obstructive pulmonary disease, unspecified: Secondary | ICD-10-CM | POA: Diagnosis not present

## 2019-06-23 DIAGNOSIS — C3431 Malignant neoplasm of lower lobe, right bronchus or lung: Secondary | ICD-10-CM

## 2019-06-23 DIAGNOSIS — I10 Essential (primary) hypertension: Secondary | ICD-10-CM

## 2019-06-23 LAB — POCT GLYCOSYLATED HEMOGLOBIN (HGB A1C): Hemoglobin A1C: 6.2 % — AB (ref 4.0–5.6)

## 2019-06-23 NOTE — Progress Notes (Signed)
[facility] [place], [place] [address]  Internal MEDICINE  Office Visit Note  Patient Name: Angela Jensen  [id number]  [id number]  Date of Service: 06/28/2019   Pt is here for routine health maintenance examination   Chief Complaint  Patient presents with  . Annual Exam  . Diabetes  . Hypertension  . Hyperlipidemia  . Breathing Problem    sometimes when pt lays down she feels like there is water in her chest      The patient is here for health maintenance exam. She states that she has noted increased sputum production. She states that it is worse when she is lying down or when she is very active. She states that that this started in October. She did have some radiation treatments earlier this year for the right lung. She states that this symptom started after that. She does use rescue inhaler and/or neblizer treatments when needed to treat wheezing and cough. She needs to get a new nebulizer machine as the one she currently has is broken.  Muscle crampis in right lower leg and foot. Happening at night. Last check of electrolytes was good. Due to check routine, fasting labs.  Does not wish to have flu shot.  Blood sugars doing well. HgbA1c 6.2 today. Blood pressure is well controlled.   Current Medication: Outpatient Encounter Medications as of 06/23/2019  Medication Sig  . Accu-Chek FastClix Lancets MISC Use as directed twice a day diag E11.65  . albuterol (PROVENTIL) (2.5 MG/3ML) 0.083% nebulizer solution Take 3 mLs (2.5 mg total) by nebulization every 6 (six) hours as needed for wheezing.  Marland Kitchen albuterol (VENTOLIN HFA) 108 (90 Base) MCG/ACT inhaler Inhale 2 puffs into the lungs every 4 (four) hours as needed for wheezing or shortness of breath.  Marland Kitchen amLODipine (NORVASC) 10 MG tablet Take 1 tablet (10 mg total) by mouth every morning.  Marland Kitchen aspirin EC 81 MG tablet Take 1 tablet (81 mg total) by mouth daily.  Marland Kitchen buPROPion (WELLBUTRIN XL) 150 MG 24 hr tablet TAKE  1 TABLET BY MOUTH TWICE DAILY FOR SMOKING CESSATION  . clopidogrel (PLAVIX) 75 MG tablet Take 1 tablet (75 mg total) by mouth daily.  Marland Kitchen gabapentin (NEURONTIN) 100 MG capsule Take 1 capsule (100 mg total) by mouth 2 (two) times daily.  Marland Kitchen glucose blood (ACCU-CHEK AVIVA PLUS) test strip Use as instructed to check blood sugars three times daily.  E11.65  . lisinopril-hydrochlorothiazide (PRINZIDE,ZESTORETIC) 10-12.5 MG tablet Take 1 tablet by mouth every morning.  . meloxicam (MOBIC) 7.5 MG tablet Take 1 tablet (7.5 mg total) by mouth 2 (two) times daily.  . metFORMIN (GLUCOPHAGE-XR) 500 MG 24 hr tablet Take 1 tablet (500 mg total) by mouth daily with breakfast.  . mometasone-formoterol (DULERA) 200-5 MCG/ACT AERO Inhale 1 puff into the lungs 2 (two) times daily. Please fill as 90 day prescription  . OXYGEN Inhale 2 L into the lungs at bedtime.  . potassium chloride (K-DUR) 10 MEQ tablet TAKE 1 TABLET BY MOUTH EVERY OTHER DAY FOR  LOW  POTASSIUM  . rOPINIRole (REQUIP) 0.5 MG tablet Take 1 tablet (0.5 mg total) by mouth 2 (two) times daily.  . rosuvastatin (CRESTOR) 10 MG tablet Take 1 tablet (10 mg total) by mouth at bedtime.  . sitaGLIPtin (JANUVIA) 25 MG tablet Take 1 tablet (25 mg total) by mouth daily.  Marland Kitchen tiotropium (SPIRIVA HANDIHALER) 18 MCG inhalation capsule Place 1 capsule (18 mcg total) into inhaler and inhale daily.  No facility-administered encounter medications on file as of 06/23/2019.     Surgical History: Past Surgical History:  Procedure Laterality Date  . CESAREAN SECTION     x3  . COLONOSCOPY WITH PROPOFOL N/A 06/25/2015   Procedure: COLONOSCOPY WITH PROPOFOL;  Surgeon: Lucilla Lame, MD;  Location: [facility] ENDOSCOPY;  Service: Endoscopy;  Laterality: N/A;  . CYST REMOVAL LEG     and on shoulder   . LOWER EXTREMITY ANGIOGRAPHY Left 09/29/2018   Procedure: LOWER EXTREMITY ANGIOGRAPHY;  Surgeon: Algernon Huxley, MD;  Location: [place] [facility];  Service: Cardiovascular;   Laterality: Left;  . LUNG BIOPSY  05 15 2013   has lung "spots"  . PERIPHERAL VASCULAR CATHETERIZATION Left 06/01/2016   Procedure: Lower Extremity Angiography;  Surgeon: Algernon Huxley, MD;  Location: [place] [facility];  Service: Cardiovascular;  Laterality: Left;  . PERIPHERAL VASCULAR CATHETERIZATION N/A 06/01/2016   Procedure: Abdominal Aortogram w/Lower Extremity;  Surgeon: Algernon Huxley, MD;  Location: [place] [facility];  Service: Cardiovascular;  Laterality: N/A;  . PERIPHERAL VASCULAR CATHETERIZATION  06/01/2016   Procedure: Lower Extremity Intervention;  Surgeon: Algernon Huxley, MD;  Location: [place] [facility];  Service: Cardiovascular;;  . PERIPHERAL VASCULAR CATHETERIZATION Right 06/08/2016   Procedure: Lower Extremity Angiography;  Surgeon: Algernon Huxley, MD;  Location: [place] [facility];  Service: Cardiovascular;  Laterality: Right;  . PERIPHERAL VASCULAR CATHETERIZATION  06/08/2016   Procedure: Lower Extremity Intervention;  Surgeon: Algernon Huxley, MD;  Location: [place] [facility];  Service: Cardiovascular;;    Medical History: Past Medical History:  Diagnosis Date  . Arthritis   . Asthma   . Atherosclerosis of native arteries of extremity with intermittent claudication ([place]) 05/26/2016  . Cancer [facility]) 2012   Right Lung CA  . COPD (chronic obstructive pulmonary disease) ([place])   . Depression   . Diabetes mellitus without complication [facility] [employer]    Patient takes Janumet  . Essential hypertension 05/26/2016  . Hypercholesteremia   . Hypertension   . 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 PAD (peripheral artery disease) ([place]) 06/22/2016  . Peripheral vascular disease ([place])   . Personal history of radiation therapy   . Shortness of breath dyspnea    with exertion   . Sleep apnea     Family History: Family History  Problem Relation Age of Onset  . Lung cancer Father   . Diabetes Mother   . Hypercholesterolemia Mother   . Diabetes Sister   . Diabetes Maternal  Grandmother   . Diabetes Paternal Grandmother   . Diabetes Sister   . Heart attack Brother   . Coronary artery disease Brother   . Vascular Disease Brother   . Hypertension Sister   . Heart attack Brother       Review of Systems  Constitutional: Negative for chills, fatigue and unexpected weight change.  HENT: Negative for congestion, postnasal drip, rhinorrhea, sneezing and sore throat.   Respiratory: Positive for cough and wheezing. Negative for chest tightness and shortness of breath.        Intermittent.   Cardiovascular: Negative for chest pain and palpitations.  Gastrointestinal: Negative for abdominal pain, constipation, diarrhea, nausea and vomiting.  Endocrine: Negative for cold intolerance, heat intolerance, polydipsia and polyuria.       Blood sugars doing well   Genitourinary: Negative for dysuria, flank pain, frequency and urgency.  Musculoskeletal: Negative for arthralgias, back pain, joint swelling and neck pain.  Skin: Negative for rash.  Allergic/Immunologic: Positive for environmental allergies.  Neurological: Negative for dizziness, tremors, numbness and headaches.  Hematological: Negative for adenopathy. Does not bruise/bleed easily.  Psychiatric/Behavioral: Negative for behavioral problems (Depression), sleep disturbance and suicidal ideas. The patient is not nervous/anxious.     Today's Vitals   06/23/19 0909  BP: 123/76  Pulse: 99  Resp: 16  Temp: (!) 97.5 F (36.4 C)  SpO2: 95%  Weight: 122 lb (55.3 kg)  Height: 4\' 11"  (1.499 m)   Body mass index is 24.64 kg/m.  Physical Exam Vitals signs and nursing note reviewed.  Constitutional:      General: She is not in acute distress.    Appearance: Normal appearance. She is well-developed. She is not diaphoretic.  HENT:     Head: Normocephalic and atraumatic.     Nose: Nose normal.     Mouth/Throat:     Pharynx: No oropharyngeal exudate.  Eyes:     Extraocular Movements: Extraocular movements  intact.     Pupils: Pupils are equal, round, and reactive to light.  Neck:     Musculoskeletal: Normal range of motion and neck supple.     Thyroid: No thyromegaly.     Vascular: No carotid bruit or JVD.     Trachea: No tracheal deviation.  Cardiovascular:     Rate and Rhythm: Normal rate and regular rhythm.     Pulses:          Dorsalis pedis pulses are 1+ on the right side and 1+ on the left side.       Posterior tibial pulses are 1+ on the right side and 1+ on the left side.     Heart sounds: Normal heart sounds. No murmur. No friction rub. No gallop.   Pulmonary:     Effort: Pulmonary effort is normal. No respiratory distress.     Breath sounds: Normal breath sounds. No wheezing or rales.     Comments: Very mild congested lung fields with deep inspiration, otherwise lung sounds are clear.  Chest:     Chest wall: No tenderness.     Breasts:        Right: Normal. No swelling, bleeding, inverted nipple, mass, nipple discharge, skin change or tenderness.        Left: Normal. No swelling, bleeding, inverted nipple, mass, nipple discharge, skin change or tenderness.  Abdominal:     General: Bowel sounds are normal.     Palpations: Abdomen is soft.     Tenderness: There is no abdominal tenderness.  Musculoskeletal: Normal range of motion.     Right foot: Normal range of motion. No deformity.     Left foot: Normal range of motion. No deformity.  Feet:     Right foot:     Protective Sensation: 10 sites tested. 10 sites sensed.     Skin integrity: Skin integrity normal.     Toenail Condition: Right toenails are normal.     Left foot:     Protective Sensation: 10 sites tested. 10 sites sensed.     Skin integrity: Skin integrity normal.     Toenail Condition: Left toenails are normal.  Lymphadenopathy:     Cervical: No cervical adenopathy.     Upper Body:     Right upper body: No supraclavicular or axillary adenopathy.     Left upper body: No supraclavicular or axillary adenopathy.   Skin:    General: Skin is warm and dry.  Neurological:  Mental Status: She is alert and oriented to person, place, and time.     Cranial Nerves: No cranial nerve deficit.  Psychiatric:        Behavior: Behavior normal.        Thought Content: Thought content normal.        Judgment: Judgment normal.      LABS: Recent Results (from the past 2160 hour(s))  POCT HgB A1C     Status: Abnormal   Collection Time: 06/23/19  9:29 AM  Result Value Ref Range   Hemoglobin A1C 6.2 (A) 4.0 - 5.6 %   HbA1c POC (<> result, manual entry)     HbA1c, POC (prediabetic range)     HbA1c, POC (controlled diabetic range)      Assessment/Plan: 1. Encounter for general adult medical examination with abnormal findings Annual health maintenance exam today.   2. Type 2 diabetes mellitus with hyperglycemia, without long-term current use of insulin (HCC) - POCT HgB A1C 6.2 today. Continue all diabetic medications as prescribed   3. Chronic obstructive pulmonary disease, unspecified COPD type ([place]) Patient should conitnue to use inhalers and respiratory medications as prescribed. Placed new order for home nebulizer as the one she currently has is broken.  - For home use only DME Nebulizer machine  4. Cancer of lower lobe of right lung ([place]) Radiation treatments over the summer of 2020. Continue regular visits with pulmonology and oncology as scheduled.   5. Essential hypertension Stable. Continue bp medication as prescribed   6. Dysuria U/a with micriscopic exam ordered today.  General Counseling: Nico verbalizes understanding of the findings of todays visit and agrees with plan of treatment. I have discussed any further diagnostic evaluation that may be needed or ordered today. We also reviewed her medications today. she has been encouraged to call the office with any questions or concerns that should arise related to todays visit.    Counseling:  This patient was seen by Leretha Pol FNP  Collaboration with Dr [name] as a part of collaborative care agreement  Orders Placed This Encounter  Procedures  . For home use only DME Nebulizer machine  . POCT HgB A1C      Time spent: [place], MD  Internal Medicine

## 2019-06-26 ENCOUNTER — Telehealth: Payer: Self-pay

## 2019-06-26 ENCOUNTER — Other Ambulatory Visit: Payer: Self-pay

## 2019-06-26 DIAGNOSIS — J449 Chronic obstructive pulmonary disease, unspecified: Secondary | ICD-10-CM

## 2019-06-26 NOTE — Telephone Encounter (Signed)
Pt advised we send nebulizer machine to [facility] also spoke with [facility] that we place order in Epic

## 2019-06-26 NOTE — Telephone Encounter (Signed)
I put written rx on your desk. What should I add to my note? Order for nebulizer?

## 2019-06-28 DIAGNOSIS — Z0001 Encounter for general adult medical examination with abnormal findings: Secondary | ICD-10-CM | POA: Insufficient documentation

## 2019-06-30 ENCOUNTER — Telehealth: Payer: Self-pay

## 2019-06-30 NOTE — Telephone Encounter (Signed)
CMN O2 ORDER SIGNED AND FAXED BACK TO [facility] AND PLACED IN SCAN.

## 2019-07-04 ENCOUNTER — Ambulatory Visit
Admission: RE | Admit: 2019-07-04 | Discharge: 2019-07-04 | Disposition: A | Discharge status: Home / Self Care | Payer: BLUE CROSS/BLUE SHIELD | Source: Ambulatory Visit | Attending: Nurse Practitioner | Admitting: Nurse Practitioner

## 2019-07-04 DIAGNOSIS — Z1231 Encounter for screening mammogram for malignant neoplasm of breast: Secondary | ICD-10-CM | POA: Diagnosis not present

## 2019-07-05 NOTE — Progress Notes (Signed)
Negative mammogram

## 2019-07-07 ENCOUNTER — Other Ambulatory Visit: Payer: Self-pay

## 2019-07-07 MED ORDER — ALBUTEROL SULFATE (2.5 MG/3ML) 0.083% IN NEBU: 2.5000 mg | INHALATION_SOLUTION | Freq: Four times a day (QID) | RESPIRATORY_TRACT | 3 refills | Status: DC | PRN

## 2019-07-19 ENCOUNTER — Other Ambulatory Visit: Payer: Self-pay

## 2019-07-19 ENCOUNTER — Ambulatory Visit
Admission: RE | Admit: 2019-07-19 | Discharge: 2019-07-19 | Disposition: A | Discharge status: Home / Self Care | Payer: BLUE CROSS/BLUE SHIELD | Source: Ambulatory Visit | Attending: Hematology and Oncology | Admitting: Hematology and Oncology

## 2019-07-19 DIAGNOSIS — C3411 Malignant neoplasm of upper lobe, right bronchus or lung: Secondary | ICD-10-CM | POA: Diagnosis present

## 2019-07-19 DIAGNOSIS — C3431 Malignant neoplasm of lower lobe, right bronchus or lung: Secondary | ICD-10-CM

## 2019-07-21 ENCOUNTER — Inpatient Hospital Stay: Payer: BLUE CROSS/BLUE SHIELD | Attending: Hematology and Oncology | Admitting: Hematology and Oncology

## 2019-07-21 ENCOUNTER — Telehealth: Payer: Self-pay

## 2019-07-21 ENCOUNTER — Inpatient Hospital Stay: Payer: BLUE CROSS/BLUE SHIELD

## 2019-07-21 ENCOUNTER — Other Ambulatory Visit: Payer: Self-pay

## 2019-07-21 ENCOUNTER — Other Ambulatory Visit: Payer: Self-pay | Admitting: Nurse Practitioner

## 2019-07-21 DIAGNOSIS — J449 Chronic obstructive pulmonary disease, unspecified: Secondary | ICD-10-CM | POA: Insufficient documentation

## 2019-07-21 DIAGNOSIS — E119 Type 2 diabetes mellitus without complications: Secondary | ICD-10-CM | POA: Diagnosis not present

## 2019-07-21 DIAGNOSIS — E7800 Pure hypercholesterolemia, unspecified: Secondary | ICD-10-CM | POA: Diagnosis not present

## 2019-07-21 DIAGNOSIS — Z87891 Personal history of nicotine dependence: Secondary | ICD-10-CM | POA: Insufficient documentation

## 2019-07-21 DIAGNOSIS — Z7984 Long term (current) use of oral hypoglycemic drugs: Secondary | ICD-10-CM | POA: Diagnosis not present

## 2019-07-21 DIAGNOSIS — C3431 Malignant neoplasm of lower lobe, right bronchus or lung: Secondary | ICD-10-CM

## 2019-07-21 DIAGNOSIS — Z7982 Long term (current) use of aspirin: Secondary | ICD-10-CM | POA: Insufficient documentation

## 2019-07-21 DIAGNOSIS — Z833 Family history of diabetes mellitus: Secondary | ICD-10-CM | POA: Insufficient documentation

## 2019-07-21 DIAGNOSIS — Z8249 Family history of ischemic heart disease and other diseases of the circulatory system: Secondary | ICD-10-CM | POA: Diagnosis not present

## 2019-07-21 DIAGNOSIS — C3411 Malignant neoplasm of upper lobe, right bronchus or lung: Secondary | ICD-10-CM | POA: Insufficient documentation

## 2019-07-21 DIAGNOSIS — I10 Essential (primary) hypertension: Secondary | ICD-10-CM | POA: Diagnosis not present

## 2019-07-21 DIAGNOSIS — R970 Elevated carcinoembryonic antigen [CEA]: Secondary | ICD-10-CM | POA: Diagnosis not present

## 2019-07-21 DIAGNOSIS — G4730 Sleep apnea, unspecified: Secondary | ICD-10-CM | POA: Diagnosis not present

## 2019-07-21 DIAGNOSIS — Z791 Long term (current) use of non-steroidal anti-inflammatories (NSAID): Secondary | ICD-10-CM | POA: Diagnosis not present

## 2019-07-21 DIAGNOSIS — Z79899 Other long term (current) drug therapy: Secondary | ICD-10-CM | POA: Diagnosis not present

## 2019-07-21 DIAGNOSIS — Z801 Family history of malignant neoplasm of trachea, bronchus and lung: Secondary | ICD-10-CM | POA: Insufficient documentation

## 2019-07-21 DIAGNOSIS — F329 Major depressive disorder, single episode, unspecified: Secondary | ICD-10-CM | POA: Insufficient documentation

## 2019-07-21 DIAGNOSIS — Z923 Personal history of irradiation: Secondary | ICD-10-CM | POA: Insufficient documentation

## 2019-07-21 DIAGNOSIS — Z7951 Long term (current) use of inhaled steroids: Secondary | ICD-10-CM | POA: Insufficient documentation

## 2019-07-21 DIAGNOSIS — I739 Peripheral vascular disease, unspecified: Secondary | ICD-10-CM

## 2019-07-21 LAB — CBC WITH DIFFERENTIAL/PLATELET
Abs Immature Granulocytes: 0.01 10*3/uL (ref 0.00–0.07)
Basophils Absolute: 0 10*3/uL (ref 0.0–0.1)
Basophils Relative: 1 %
Eosinophils Absolute: 0.2 10*3/uL (ref 0.0–0.5)
Eosinophils Relative: 4 %
HCT: 34.2 % — ABNORMAL LOW (ref 36.0–46.0)
Hemoglobin: 10.5 g/dL — ABNORMAL LOW (ref 12.0–15.0)
Immature Granulocytes: 0 %
Lymphocytes Relative: 19 %
Lymphs Abs: 1.2 10*3/uL (ref 0.7–4.0)
MCH: 26.3 pg (ref 26.0–34.0)
MCHC: 30.7 g/dL (ref 30.0–36.0)
MCV: 85.7 fL (ref 80.0–100.0)
Monocytes Absolute: 0.7 10*3/uL (ref 0.1–1.0)
Monocytes Relative: 11 %
Neutro Abs: 4 10*3/uL (ref 1.7–7.7)
Neutrophils Relative %: 65 %
Platelets: 284 10*3/uL (ref 150–400)
RBC: 3.99 MIL/uL (ref 3.87–5.11)
RDW: 14.8 % (ref 11.5–15.5)
WBC: 6.1 10*3/uL (ref 4.0–10.5)
nRBC: 0 % (ref 0.0–0.2)

## 2019-07-21 LAB — COMPREHENSIVE METABOLIC PANEL
ALT: 26 U/L (ref 0–44)
AST: 37 U/L (ref 15–41)
Albumin: 4.2 g/dL (ref 3.5–5.0)
Alkaline Phosphatase: 72 U/L (ref 38–126)
Anion gap: 6 (ref 5–15)
BUN: 15 mg/dL (ref 8–23)
CO2: 28 mmol/L (ref 22–32)
Calcium: 8.9 mg/dL (ref 8.9–10.3)
Chloride: 101 mmol/L (ref 98–111)
Creatinine, Ser: 0.8 mg/dL (ref 0.44–1.00)
GFR calc Af Amer: >60 mL/min (ref >60)
GFR calc non Af Amer: >60 mL/min (ref >60)
Glucose, Bld: 165 mg/dL — ABNORMAL HIGH (ref 70–99)
Potassium: 4 mmol/L (ref 3.5–5.1)
Sodium: 135 mmol/L (ref 135–145)
Total Bilirubin: 0.6 mg/dL (ref 0.3–1.2)
Total Protein: 7.9 g/dL (ref 6.5–8.1)

## 2019-07-21 MED ORDER — ALPRAZOLAM 0.25 MG PO TABS: ORAL_TABLET | ORAL | 0 refills | Status: DC

## 2019-07-21 NOTE — Progress Notes (Deleted)
[facility]  [address], [address] [place], [place] [address] Phone: [phone]  Fax: [phone]   Clinic Day:  07/21/2019  Referring physician: Ronnell Freshwater, NP  Chief Complaint: Angela Jensen is a 63 y.o. female with history of stage I lung cancer s/p radiation who is seen for 6 month assessment.   HPI: The patient was last encountered in the medical oncology clinic via telemedicine on 06/08/22020. At that time, she denied any shortness of breath. She noted some pain under her right breast after lifting a case of water. Protein was 8.3 (elevated).  CEA was 7.9  SPEP on 02/16/2019 revealed no monoclonal protein.  Kappa free light chains were 17.8, Lambda free light chains 16.5, and ratio 1.08 (normal).  Pulmonary function testing on 01/25/2019 was c/w severe obstructive lung disease. FEV1 was 0.6 (36% predicted).  She was seen for a pulmonary follow up of COPD with Orson Gear AGNP-C on 05/04/2019. She was to continue on her oxygen 2 liters/min and her inhalers.  She has a 4 month follow up.  She was seen for a 10 month follow up s/p SBRT with Dr. Baruch Gouty on 02/02/2019. Chest CT and 6 month follow up was scheduled.   Bilateral mammogram on 07/04/2019 revealed no mammographic evidence of malignancy.  Chest CT without contrast on 07/19/2019 revealed a 6 x 6 mm nodule in the right lower lobe (previously 3 mm). This nodule was separate and discrete from the previous treatment beds. Metachronous or metastatic disease was a concern. Close CT follow-up was recommended. A  5 mm right lower lobe pulmonary nodule was new.  There was a stable appearance of parenchymal scarring in the right lung period and emphysema.  During the interim, ***   Past Medical History:  Diagnosis Date   Arthritis    Asthma    Atherosclerosis of native arteries of extremity with intermittent claudication ([place]) 05/26/2016   Cancer ([place]) 2012   Right Lung CA   COPD (chronic  obstructive pulmonary disease) ([place])    Depression    Diabetes mellitus without complication ([place])    Patient takes Janumet   Essential hypertension 05/26/2016   Hypercholesteremia    Hypertension    Oxygen dependent    2L at nite    PAD (peripheral artery disease) ([place]) 06/22/2016   Peripheral vascular disease ([facility])    Personal history of radiation therapy    Shortness of breath dyspnea    with exertion    Sleep apnea     Past Surgical History:  Procedure Laterality Date   CESAREAN SECTION     x3   COLONOSCOPY WITH PROPOFOL N/A 06/25/2015   Procedure: COLONOSCOPY WITH PROPOFOL;  Surgeon: Lucilla Lame, MD;  Location: [facility];  Service: Endoscopy;  Laterality: N/A;   CYST REMOVAL LEG     and on shoulder    LOWER EXTREMITY ANGIOGRAPHY Left 09/29/2018   Procedure: LOWER EXTREMITY ANGIOGRAPHY;  Surgeon: Algernon Huxley, MD;  Location: [place] [facility];  Service: Cardiovascular;  Laterality: Left;   LUNG BIOPSY  05 15 2013   has lung "spots"   PERIPHERAL VASCULAR CATHETERIZATION Left 06/01/2016   Procedure: Lower Extremity Angiography;  Surgeon: Algernon Huxley, MD;  Location: [place] [facility];  Service: Cardiovascular;  Laterality: Left;   PERIPHERAL VASCULAR CATHETERIZATION N/A 06/01/2016   Procedure: Abdominal Aortogram w/Lower Extremity;  Surgeon: Algernon Huxley, MD;  Location: [place] [facility];  Service: Cardiovascular;  Laterality: N/A;   PERIPHERAL  VASCULAR CATHETERIZATION  06/01/2016   Procedure: Lower Extremity Intervention;  Surgeon: Algernon Huxley, MD;  Location: [place] [facility];  Service: Cardiovascular;;   PERIPHERAL VASCULAR CATHETERIZATION Right 06/08/2016   Procedure: Lower Extremity Angiography;  Surgeon: Algernon Huxley, MD;  Location: [place] [facility];  Service: Cardiovascular;  Laterality: Right;   PERIPHERAL VASCULAR CATHETERIZATION  06/08/2016   Procedure: Lower Extremity Intervention;  Surgeon: Algernon Huxley, MD;  Location: [place] [facility];  Service: Cardiovascular;;    Family History  Problem Relation Age of Onset   Lung cancer Father    Diabetes Mother    Hypercholesterolemia Mother    Diabetes Sister    Diabetes Maternal Grandmother    Diabetes Paternal Grandmother    Diabetes Sister    Heart attack Brother    Coronary artery disease Brother    Vascular Disease Brother    Hypertension Sister    Heart attack Brother     Social History:  reports that she quit smoking about 9 years ago. Her smoking use included cigarettes. She has a 37.00 pack-year smoking history. She has quit using smokeless tobacco.  Her smokeless tobacco use included snuff. She reports current alcohol use of about 5.0 standard drinks of alcohol per week. She reports previous drug use. Drugs: Marijuana, "Crack" cocaine, and Cocaine. She has not smoked for 1 month. She started smoking at 17 until she was "2 something". Her boyfriend is [name]: [phone]. She lives in [place].  The patient is {Blank single:19197::"alone","accompanied by"} *** today.  Allergies: No Known Allergies  Current Medications: Current Outpatient Medications  Medication Sig Dispense Refill   Accu-Chek FastClix Lancets MISC Use as directed twice a day diag E11.65 100 each 1   albuterol (PROVENTIL) (2.5 MG/3ML) 0.083% nebulizer solution Take 3 mLs (2.5 mg total) by nebulization every 6 (six) hours as needed for wheezing. 75 mL 3   albuterol (VENTOLIN HFA) 108 (90 Base) MCG/ACT inhaler Inhale 2 puffs into the lungs every 4 (four) hours as needed for wheezing or shortness of breath. 18 g 3   ALPRAZolam (XANAX) 0.25 MG tablet May take 1/2 to 1 tablet po one time one to two hours prior to flight. May repeat dose in one hour as needed for persistent anxiety. 10 tablet 0   amLODipine (NORVASC) 10 MG tablet Take 1 tablet (10 mg total) by mouth every morning. 30 tablet 5   aspirin EC 81 MG tablet Take 1 tablet (81 mg total) by mouth daily.  150 tablet 2   buPROPion (WELLBUTRIN XL) 150 MG 24 hr tablet TAKE 1 TABLET BY MOUTH TWICE DAILY FOR SMOKING CESSATION 60 tablet 6   clopidogrel (PLAVIX) 75 MG tablet Take 1 tablet (75 mg total) by mouth daily. 30 tablet 11   gabapentin (NEURONTIN) 100 MG capsule Take 1 capsule (100 mg total) by mouth 2 (two) times daily. 60 capsule 5   glucose blood (ACCU-CHEK AVIVA PLUS) test strip Use as instructed to check blood sugars three times daily.  E11.65 250 each 5   lisinopril-hydrochlorothiazide (PRINZIDE,ZESTORETIC) 10-12.5 MG tablet Take 1 tablet by mouth every morning. 30 tablet 5   meloxicam (MOBIC) 7.5 MG tablet Take 1 tablet (7.5 mg total) by mouth 2 (two) times daily. 60 tablet 3   metFORMIN (GLUCOPHAGE-XR) 500 MG 24 hr tablet Take 1 tablet (500 mg total) by mouth daily with breakfast. 30 tablet 3   mometasone-formoterol (DULERA) 200-5 MCG/ACT AERO Inhale 1 puff into the lungs 2 (two) times  daily. Please fill as 90 day prescription 3 Inhaler 3   OXYGEN Inhale 2 L into the lungs at bedtime.     potassium chloride (K-DUR) 10 MEQ tablet TAKE 1 TABLET BY MOUTH EVERY OTHER DAY FOR  LOW  POTASSIUM 45 tablet 3   rOPINIRole (REQUIP) 0.5 MG tablet Take 1 tablet (0.5 mg total) by mouth 2 (two) times daily. 180 tablet 0   rosuvastatin (CRESTOR) 10 MG tablet Take 1 tablet (10 mg total) by mouth at bedtime. 30 tablet 5   sitaGLIPtin (JANUVIA) 25 MG tablet Take 1 tablet (25 mg total) by mouth daily. 30 tablet 3   tiotropium (SPIRIVA HANDIHALER) 18 MCG inhalation capsule Place 1 capsule (18 mcg total) into inhaler and inhale daily. 90 capsule 2   No current facility-administered medications for this visit.     Review of Systems  Constitutional: Negative.  Negative for chills, fever, malaise/fatigue and weight loss (stable).       Feels "fine".  HENT: Negative.  Negative for congestion, ear discharge, ear pain, nosebleeds, sinus pain, sore throat and tinnitus.   Eyes: Negative.  Negative for  double vision, photophobia, pain and discharge.  Respiratory: Positive for shortness of breath (exertional). Negative for cough, hemoptysis and sputum production.   Cardiovascular: Positive for chest pain (under breasts). Negative for palpitations, orthopnea, leg swelling and PND.  Gastrointestinal: Negative.  Negative for abdominal pain, blood in stool, constipation, diarrhea, melena, nausea and vomiting.  Genitourinary: Negative.  Negative for frequency, hematuria and urgency.  Musculoskeletal: Positive for joint pain (shoulder). Negative for back pain, falls, myalgias and neck pain.  Skin: Negative.  Negative for itching and rash.  Neurological: Negative.  Negative for dizziness, tingling, sensory change, speech change, weakness and headaches.  Endo/Heme/Allergies: Negative.  Does not bruise/bleed easily.       Diabetes.  Psychiatric/Behavioral: Negative.  Negative for depression and memory loss. The patient is not nervous/anxious and does not have insomnia.   All other systems reviewed and are negative.   Performance status (ECOG): 1 - Symptomatic but completely ambulatory  Vitals There were no vitals taken for this visit.   Physical Exam  Constitutional: She is oriented to person, place, and time. She appears well-developed and well-nourished. No distress.  HENT:  Head: Normocephalic and atraumatic.  Brown hair.  Eyes: Pupils are equal, round, and reactive to light. Conjunctivae and EOM are normal. No scleral icterus.  Glasses.  Brown eyes.  Neurological: She is alert and oriented to person, place, and time.  Psychiatric: She has a normal mood and affect. Her behavior is normal. Judgment and thought content normal.  Nursing note reviewed.   Imaging studies:  08/03/2011:  PET scan revealed a 10-mm spiculated right apical pulmonary nodule (SUV 2.5) suspcious for lung cancer. Left ovary was prominent (3.9 x 2.4 cm). CA-125 was 9.3 (normal). 01/30/2015:  PET scanrevealed a  hypermetabolic and enlarging RUL pulmonary nodules concerning for lung cancer recurrence. There was a small focus of peripheral nodular consolidation in the superior segment of the right lower lobe with differential including inflammation/infection versus neoplasm. There was no evidence of mediastinal nodal metastasis.  01/19/2018:  Chest CTrevealed a subsolid 2.1 cm peripheral right lower lobe pulmonary nodule. Although not appreciably changed in size, subjectively this nodule had increased in density, and remains suspicious for primary bronchogenic adenocarcinoma. There was no thoracic adenopathy. 01/17/2019:  Chest CT without contrast revealed decreased subpleural reticulation at the SBRT site in the RIGHT lower lobe consistent with improved radiation change.  There was residual nodularity along the fissure at this site with recommendation for close attention on follow-up. There was stable pulmonary scarring in the right upper lobe and right middle lobe. There was centrilobular emphysema. 07/19/2019:  Chest CT without contrast revealed a 6 x 6 mm nodule in the right lower lobe (previously 3 mm). This nodule was separate and discrete from the previous treatment beds. Metachronous or metastatic disease was a concern. A  5 mm right lower lobe pulmonary nodule was new.  There was a stable appearance of parenchymal scarring in the right lung period and emphysema.   No visits with results within 3 Day(s) from this visit.  Latest known visit with results is:  Appointment on 07/21/2019  Component Date Value Ref Range Status   Sodium 07/21/2019 135  135 - 145 mmol/L Final   Potassium 07/21/2019 4.0  3.5 - 5.1 mmol/L Final   Chloride 07/21/2019 101  98 - 111 mmol/L Final   CO2 07/21/2019 28  22 - 32 mmol/L Final   Glucose, Bld 07/21/2019 165* 70 - 99 mg/dL Final   BUN 07/21/2019 15  8 - 23 mg/dL Final   Creatinine, Ser 07/21/2019 0.80  0.44 - 1.00 mg/dL Final   Calcium 07/21/2019 8.9  8.9 -  10.3 mg/dL Final   Total Protein 07/21/2019 7.9  6.5 - 8.1 g/dL Final   Albumin 07/21/2019 4.2  3.5 - 5.0 g/dL Final   AST 07/21/2019 37  15 - 41 U/L Final   ALT 07/21/2019 26  0 - 44 U/L Final   Alkaline Phosphatase 07/21/2019 72  38 - 126 U/L Final   Total Bilirubin 07/21/2019 0.6  0.3 - 1.2 mg/dL Final   GFR calc non Af Amer 07/21/2019 >60  >60 mL/min Final   GFR calc Af Amer 07/21/2019 >60  >60 mL/min Final   Anion gap 07/21/2019 6  5 - 15 Final   Performed at [facility], [address] [place], [place] [address]   WBC 07/21/2019 6.1  4.0 - 10.5 K/uL Final   RBC 07/21/2019 3.99  3.87 - 5.11 MIL/uL Final   Hemoglobin 07/21/2019 10.5* 12.0 - 15.0 g/dL Final   HCT 07/21/2019 34.2* 36.0 - 46.0 % Final   MCV 07/21/2019 85.7  80.0 - 100.0 fL Final   MCH 07/21/2019 26.3  26.0 - 34.0 pg Final   MCHC 07/21/2019 30.7  30.0 - 36.0 g/dL Final   RDW 07/21/2019 14.8  11.5 - 15.5 % Final   Platelets 07/21/2019 284  150 - 400 K/uL Final   nRBC 07/21/2019 0.0  0.0 - 0.2 % Final   Neutrophils Relative % 07/21/2019 65  % Final   Neutro Abs 07/21/2019 4.0  1.7 - 7.7 K/uL Final   Lymphocytes Relative 07/21/2019 19  % Final   Lymphs Abs 07/21/2019 1.2  0.7 - 4.0 K/uL Final   Monocytes Relative 07/21/2019 11  % Final   Monocytes Absolute 07/21/2019 0.7  0.1 - 1.0 K/uL Final   Eosinophils Relative 07/21/2019 4  % Final   Eosinophils Absolute 07/21/2019 0.2  0.0 - 0.5 K/uL Final   Basophils Relative 07/21/2019 1  % Final   Basophils Absolute 07/21/2019 0.0  0.0 - 0.1 K/uL Final   Immature Granulocytes 07/21/2019 0  % Final   Abs Immature Granulocytes 07/21/2019 0.01  0.00 - 0.07 K/uL Final   Performed at [facility], [address], [name], [place] [address]    Assessment:  Angela Jensen  is a 63 y.o. 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 She was not a good surgery candidate secondary  to her lung function (FEV1 28%).  PET scan on 08/03/2011 revealed a 10-mm spiculated right apical pulmonary nodule (SUV 2.5) suspcious for lung cancer. Left ovary was prominent (3.9 x 2.4 cm). CA-125 was 9.3 (normal).  CT guided biopsy of the superior and inferior RUL noduleson 12/30/2011 revealed well differentiated adenocarcinoma of lung primary. EGFR testing revealed no mutation. She received IMRT 6000 cGyto both nodules from 01/26/2012 - 03/10/2012.  An attempt at radiofrequency ablation of 1 enlarging RUL nodule was made  in 04/2015. At the time of the procedure, the nodule had shrunk to 5 mm from 9 mm.  Chest CTon 01/19/2018 revealed a subsolid 2.1 cm peripheral right lower lobe pulmonary nodule. Although not appreciably changed in size, subjectively this nodule had increased in density, and remains suspicious for primary bronchogenic adenocarcinoma. There was no thoracic adenopathy.  CT guided RLL biopsyon 02/09/2018 revealed adenocarcinoma of lung origin, lepidic and papillary patterns.   She received6000 cGy in 5 fractions toRLL nodulefrom 03/22/2018 - 04/05/2018.  Chest CT without contrast on 07/19/2019 revealed a 6 x 6 mm nodule in the right lower lobe (previously 3 mm). This nodule was separate and discrete from the previous treatment beds. Metachronous or metastatic disease was a concern. Close CT follow-up was recommended. A  5 mm right lower lobe pulmonary nodule was new.  There was a stable appearance of parenchymal scarring in the right lung period and emphysema.  She has an elevated CEAof unclear etiology. CEA was 8.1 on 01/31/2016, 9.2 on 03/03/2016, 7.9 on 10/12/2017, and 7.9 on 01/19/2019.  Colonoscopyon 06/25/2015 revealed a 4 mm polyp in the sigmoid colon. Pathology revealed a tubular adenoma without evidence of dysplasia or malignancy.  Pelvic MRIon 04/30/2016 revealed a 3.4 x 2.5 cm ovoid mass in the LEFT adnexa with signal characteristics  identical to the intramural leiomyomas and consistent with an exophytic leiomyoma. 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 Endometrium was normal with a normal junctional zone. Ovaries were not identified.  Symptomatically, ***  Plan: 1.   Review labs from 07/21/2019.   2.Stage I adenocarcinoma RUL Patient is s/p IMRT (completed 03/10/2012). Review chest CT from 01/17/2019.  Images personally reviewed.  Agree with radiology interpretation.  No evidence of recurrent disease. Continue follow-up imaging every 6 months.             Schedule chest CT on  07/19/2019. 3.RLL adenocarcinoma Patient is s/p SBRT (completed 04/05/2018). As above, chest CT reveals no evidence of recurrent disease. 4. Elevated total protein             Protein 8.3 (6.5-8.1) on 01/19/2019.             Etiology unclear.             RTC for labs (myeloma panel and FLCA). 5. Discomfort under right breast             Etiology unclear but temporally related to lifting a case of water.             Follow-up with MD if symptoms do not resolve. 6.   RTC after chest CT for MD assessment and labs (CBC with diff, CMP, CEA).  I discussed the assessment and treatment plan with the patient.  The patient was provided an opportunity to ask questions and all were answered.  The patient agreed with  the plan and demonstrated an understanding of the instructions.  The patient was advised to call back if the symptoms worsen or if the condition fails to improve as anticipated.  I provided *** minutes (12:24 PM - 12:24 PM) of face-to-face time during this this encounter and > 50% was spent counseling as documented under my assessment and plan.    Lequita Asal, MD, PhD    07/21/2019, 12:24 PM  I, Samul Dada, am acting as a scribe for Lequita Asal, MD.  {Add scribe attestation statement}

## 2019-07-21 NOTE — Telephone Encounter (Signed)
Sent alprazolam 0.25mg  tablets to [facility] [address]. May take 1/2 to 1 tablet po one time one to two hours prior to flight. May repeat dose in one hour as needed for persistent anxiety.

## 2019-07-21 NOTE — Progress Notes (Signed)
Patient having severe anxiety in regard to upcoming trip for which she will fly for the first time. Sent alprazolam 0.25mg  tablets to [facility] [address]. May take 1/2 to 1 tablet po one time one to two hours prior to flight. May repeat dose in one hour as needed for persistent anxiety.

## 2019-07-21 NOTE — Telephone Encounter (Signed)
Pt advised we send to [facility]

## 2019-07-22 LAB — CEA: CEA: 8.2 ng/mL — ABNORMAL HIGH (ref 0.0–4.7)

## 2019-07-23 NOTE — Progress Notes (Signed)
[facility]  [address], [address] [place], [place] [address] Phone: [phone]  Fax: [phone]   Telephone Visit:  07/25/2019  Referring physician: Ronnell Freshwater, NP  I connected with Angela Jensen on 07/25/2019 at 12:30 PM by telephone and verified that I was speaking with the correct person using 2 identifiers.  The patient was in [place].  I discussed the limitations, risk, security and privacy concerns of performing an evaluation and management service by telephone and the availability of in person appointments.  I also discussed with the patient that there may be a patient responsible charge related to this service.  The patient expressed understanding and agreed to proceed.   Chief Complaint: Angela Jensen is a 63 y.o. female with a history of stage I lung cancer s/p radiationwho is seen for6 monthassessment and review of interval chest CT.  HPI: The patient was last seen in the medical oncology clinic on 01/23/2019 via telemedicine. At that time, she denied any shortness of breath. She noted pain under her right breast after lifting a case of water. CBC was normal. Total protein was 8.3. Total bilirubin was 0.2. CEA was 7.9. We discussed a follow up chest CT. She remained on current medications.   Pulmonary function test on 01/25/2019 revealed consistency with severe obstructive lung disease. Forced vital capacity was mildly decreased.  The FEV1 was 0.69 L which was 40% of predicted and was severely decreased. There did not appear to be improvement after bronchodilators. Clinical correlation recommended.   She had a follow up in vascular surgery with Dr. [name] on 01/31/2019. Patient was doing well. She had no lifestyle limiting claudication, ischemic rest pain, or ulceration at this time.  Her ABIs were stable at 1.03 on the right and 1.04 on the left with biphasic and triphasic waveforms distally. She continued current medical regimen.   The  patient had a follow up with Dr. Baruch Gouty on 02/02/2019. She denied and cough, hemoptysis, or chest tightness. Patient was doing well. She will follow up in 6 months.   The patient was seen in pulmonology by Orson Gear, NP on 05/04/2019. She wore 2L [place] at night. She had no cough, shortness of breath, headaches, or hemoptysis. She used her nebulizer once a day, and used dulera, spiriva regularly. She used her rescue inhaler as needed, and stated she needed it daily at times, but can go without it for a week at time. No changes were made to her medications.    She had an annual exam with Leretha Pol, NP on 06/23/2019. She reported when laying down it felt like there was water in her chest at times. She had noted increased sputum production x 1 month. She stated that it was worse when she was lying down or when active. She had muscle cramps in her right lower leg and foot at night. Patient conitnued to use inhalers and respiratory medications as prescribed.  Bilateral mammogram on 07/04/2019 revealed no evidence of malignancy.   Chest CT on 07/19/2019 revealed a 6 x 6 mm nodule in the right lower lobe had increased in size from 3 mm previously. The nodule was separate and discrete from the previous treatment beds. Metachronous or metastatic disease a concern. Close CT follow up was recommended. There was a 5 mm right lower lobe pulmonary nodule new in the interval. Close attention on follow up recommended. There was stable appearance of parenchymal scarring in the right lung period.   Labs followed:  02/16/2019: Kappa free light chain 17.58. Lambda free light chains 16.5. Free light chain ratio 1.08 (normal). M-spike 0.  07/21/2019: Hematocrit 34.2. hemoglobin 10.5, platelets 284,000, WBC 6,100. CEA 8.2. CMP was normal.   During the interim, she has felt "alright". She notes shortness of breath on exertion. She is on oxygen 2 liters/min at night.  She denies any B symptoms. She has no other  complaints. The pain under her right breast has resolved.  She can perform her ADLs with no assistance needed.    Past Medical History:  Diagnosis Date  . Arthritis   . Asthma   . Atherosclerosis of native arteries of extremity with intermittent claudication ([place]) 05/26/2016  . Cancer [facility]) 2012   Right Lung CA  . COPD (chronic obstructive pulmonary disease) ([place])   . Depression   . Diabetes mellitus without complication [facility])    Patient takes Janumet  . Essential hypertension 05/26/2016  . Hypercholesteremia   . Hypertension   . 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 PAD (peripheral artery disease) ([place]) 06/22/2016  . Peripheral vascular disease ([place])   . Personal history of radiation therapy   . Shortness of breath dyspnea    with exertion   . Sleep apnea     Past Surgical History:  Procedure Laterality Date  . CESAREAN SECTION     x3  . COLONOSCOPY WITH PROPOFOL N/A 06/25/2015   Procedure: COLONOSCOPY WITH PROPOFOL;  Surgeon: Lucilla Lame, MD;  Location: [facility] ENDOSCOPY;  Service: Endoscopy;  Laterality: N/A;  . CYST REMOVAL LEG     and on shoulder   . LOWER EXTREMITY ANGIOGRAPHY Left 09/29/2018   Procedure: LOWER EXTREMITY ANGIOGRAPHY;  Surgeon: Algernon Huxley, MD;  Location: [place] [facility];  Service: Cardiovascular;  Laterality: Left;  . LUNG BIOPSY  05 15 2013   has lung "spots"  . PERIPHERAL VASCULAR CATHETERIZATION Left 06/01/2016   Procedure: Lower Extremity Angiography;  Surgeon: Algernon Huxley, MD;  Location: [place] [facility];  Service: Cardiovascular;  Laterality: Left;  . PERIPHERAL VASCULAR CATHETERIZATION N/A 06/01/2016   Procedure: Abdominal Aortogram w/Lower Extremity;  Surgeon: Algernon Huxley, MD;  Location: [place] [facility];  Service: Cardiovascular;  Laterality: N/A;  . PERIPHERAL VASCULAR CATHETERIZATION  06/01/2016   Procedure: Lower Extremity Intervention;  Surgeon: Algernon Huxley, MD;  Location: [place] [facility];  Service: Cardiovascular;;  .  PERIPHERAL VASCULAR CATHETERIZATION Right 06/08/2016   Procedure: Lower Extremity Angiography;  Surgeon: Algernon Huxley, MD;  Location: [place] [facility];  Service: Cardiovascular;  Laterality: Right;  . PERIPHERAL VASCULAR CATHETERIZATION  06/08/2016   Procedure: Lower Extremity Intervention;  Surgeon: Algernon Huxley, MD;  Location: [place] [facility];  Service: Cardiovascular;;    Family History  Problem Relation Age of Onset  . Lung cancer Father   . Diabetes Mother   . Hypercholesterolemia Mother   . Diabetes Sister   . Diabetes Maternal Grandmother   . Diabetes Paternal Grandmother   . Diabetes Sister   . Heart attack Brother   . Coronary artery disease Brother   . Vascular Disease Brother   . Hypertension Sister   . Heart attack Brother     Social History:  reports that she quit smoking about 9 years ago. Her smoking use included cigarettes. She has a 37.00 pack-year smoking history. She has quit using smokeless tobacco.  Her smokeless tobacco use included snuff. She reports current alcohol use of about 5.0  standard drinks of alcohol per week. She reports previous drug use. Drugs: Marijuana, "Crack" cocaine, and Cocaine. She has not smoked for 1 month. She started smoking at 17 until she was "61 something". Her boyfriend is [name]: [phone]. She lives in [place]. The patient is alone today.  Participants in the patient's visit and their role in the encounter included the patient Vito Berger, CMA, today.  The intake visit was provided by Vito Berger, CMA.  Allergies: No Known Allergies  Current Medications: Current Outpatient Medications  Medication Sig Dispense Refill  . Accu-Chek FastClix Lancets MISC Use as directed twice a day diag E11.65 100 each 1  . albuterol (PROVENTIL) (2.5 MG/3ML) 0.083% nebulizer solution Take 3 mLs (2.5 mg total) by nebulization every 6 (six) hours as needed for wheezing. 75 mL 3  . albuterol (VENTOLIN HFA) 108 (90 Base)  MCG/ACT inhaler Inhale 2 puffs into the lungs every 4 (four) hours as needed for wheezing or shortness of breath. 18 g 3  . amLODipine (NORVASC) 10 MG tablet Take 1 tablet (10 mg total) by mouth every morning. 30 tablet 5  . aspirin EC 81 MG tablet Take 1 tablet (81 mg total) by mouth daily. 150 tablet 2  . buPROPion (WELLBUTRIN XL) 150 MG 24 hr tablet TAKE 1 TABLET BY MOUTH TWICE DAILY FOR SMOKING CESSATION 60 tablet 6  . clopidogrel (PLAVIX) 75 MG tablet Take 1 tablet (75 mg total) by mouth daily. 30 tablet 11  . gabapentin (NEURONTIN) 100 MG capsule Take 1 capsule (100 mg total) by mouth 2 (two) times daily. 60 capsule 5  . glucose blood (ACCU-CHEK AVIVA PLUS) test strip Use as instructed to check blood sugars three times daily.  E11.65 250 each 5  . lisinopril-hydrochlorothiazide (PRINZIDE,ZESTORETIC) 10-12.5 MG tablet Take 1 tablet by mouth every morning. 30 tablet 5  . meloxicam (MOBIC) 7.5 MG tablet Take 1 tablet (7.5 mg total) by mouth 2 (two) times daily. 60 tablet 3  . metFORMIN (GLUCOPHAGE-XR) 500 MG 24 hr tablet Take 1 tablet (500 mg total) by mouth daily with breakfast. 30 tablet 3  . mometasone-formoterol (DULERA) 200-5 MCG/ACT AERO Inhale 1 puff into the lungs 2 (two) times daily. Please fill as 90 day prescription 3 Inhaler 3  . potassium chloride (K-DUR) 10 MEQ tablet TAKE 1 TABLET BY MOUTH EVERY OTHER DAY FOR  LOW  POTASSIUM 45 tablet 3  . rOPINIRole (REQUIP) 0.5 MG tablet Take 1 tablet (0.5 mg total) by mouth 2 (two) times daily. 180 tablet 0  . rosuvastatin (CRESTOR) 10 MG tablet Take 1 tablet (10 mg total) by mouth at bedtime. 30 tablet 5  . sitaGLIPtin (JANUVIA) 25 MG tablet Take 1 tablet (25 mg total) by mouth daily. 30 tablet 3  . tiotropium (SPIRIVA HANDIHALER) 18 MCG inhalation capsule Place 1 capsule (18 mcg total) into inhaler and inhale daily. 90 capsule 2  . ALPRAZolam (XANAX) 0.25 MG tablet May take 1/2 to 1 tablet po one time one to two hours prior to flight. May  repeat dose in one hour as needed for persistent anxiety. (Patient not taking: Reported on 07/24/2019) 10 tablet 0  . OXYGEN Inhale 2 L into the lungs at bedtime.     No current facility-administered medications for this visit.     Review of Systems  Constitutional: Negative.  Negative for chills, fever, malaise/fatigue and weight loss.       Feels "alright".  HENT: Negative.  Negative for congestion, ear discharge, ear pain, nosebleeds, sinus  pain, sore throat and tinnitus.   Eyes: Negative.  Negative for double vision and photophobia.  Respiratory: Positive for shortness of breath (exertional). Negative for cough, hemoptysis and sputum production.   Cardiovascular: Negative for chest pain (under breasts, resolved), palpitations, orthopnea, leg swelling and PND.  Gastrointestinal: Negative.  Negative for abdominal pain, blood in stool, constipation, diarrhea, melena, nausea and vomiting.  Genitourinary: Negative.  Negative for frequency, hematuria and urgency.  Musculoskeletal: Negative.  Negative for back pain, falls, joint pain, myalgias and neck pain.  Skin: Negative.  Negative for itching and rash.  Neurological: Negative.  Negative for dizziness, tingling, sensory change, speech change, focal weakness, weakness and headaches.  Endo/Heme/Allergies: Negative.  Does not bruise/bleed easily.       Diabetes.  Psychiatric/Behavioral: Negative for depression and memory loss. The patient is not nervous/anxious and does not have insomnia.   All other systems reviewed and are negative.   Performance status (ECOG): 1  Physical Exam  Constitutional: She is oriented to person, place, and time.  Neurological: She is alert and oriented to person, place, and time.  Psychiatric: She has a normal mood and affect. Her behavior is normal. Judgment and thought content normal.  Nursing note reviewed.   Imaging studies: 07/31/2011:  Chest CT revealed a 1.1 x 0.6 cm RUL nodule and a 9 mm inferior  aspect RUL nodule and a 4 mm nodule in the anterior aspect of the inferior aspect of the RUL. 08/03/2011:  PET scan revealed a 10-mm spiculated right apical pulmonary nodule (SUV 2.5) suspcious for lung cancer. Left ovary was prominent (3.9 x 2.4 cm).  01/30/2015:  PET scanrevealed a hypermetabolic and enlarging RUL pulmonary nodules concerning for lung cancer recurrence. There was a small focus of peripheral nodular consolidation in the superior segment of the right lower lobe with differential including inflammation/infection versus neoplasm. There was no evidence of mediastinal nodal metastasis.  03/27/2016:  PET scanrevealed no hypermetabolic activity to suggest recurrent malignancy. There was a soft tissue density in the left pelvis near the inguinal ring that was not hypermetabolic, but prominent and probably separate from the left ovary. This could be a wandering fibroid. Although the overall appearance had not changed from 2012 and was not hypermetabolic, pelvic sonography or pelvic MRI for further characterization of the uterus and ovaries can be considered. 08/01/2015:  Chest CTrevealed complete resolution of the prior nodule in the right lung apex.  01/31/2016:  Chest CTrevealed no active cardiopulmonary abnormalities. There was stable appearance of right apical and right middle lobe scar like densities.  02/02/2017:  Low dose chest CTrevealed new ground-glass nodule in the right lower lobe. Lung-RADS 3 (probably benign findings).  07/29/2017:  Low dose chest CT revealed enlargement of the previously observed sub-solid pulmonary lesion. RIGHT upper lobe lesion measured 1.4 x 2.1 x 1.6 cm (previously 1.5 x 1.1 x 1.7 cm). There was also areas of focal architectural distortion in the RIGHT upper and middle lobes that were felt to be most compatible with areas of chronic post infectious or inflammatory scarring. Aortic value calcifications were noted. Lung-RADS 4BS (suspicious).   01/19/2018:  Chest CTrevealed a subsolid 2.1 cm peripheral right lower lobe pulmonary nodule. Although not appreciably changed in size, subjectively this nodule had increased in density, and remains suspicious for primary bronchogenic adenocarcinoma. There was no thoracic adenopathy. 07/18/2018:  Chest CT revealed early postradiation change in the peripheral right lower lobe at the site of the treated tumor, with no residual measurable pulmonary nodule in  this location.There were no findings of metastatic disease in the chest. 01/17/2019:  Chest CT without contrast revealed decreased subpleural reticulation at the SBRT site in the RIGHT lower lobe consistent with improved radiation change. There was residual nodularity along the fissure at this site with recommendation for close attention on follow-up. There was stable pulmonary scarring in the right upper lobe and right middle lobe. There was centrilobular emphysema. 07/19/2019:  Chest CT revealed a 6 x 6 mm nodule in the right lower lobe had increased in size from 3 mm previously. The nodule was separate and discrete from the previous treatment beds. Metachronous or metastatic disease a concern. Close CT follow up was recommended. There was a 5 mm right lower lobe pulmonary nodule new in the interval. Close attention on follow up was recommended. There was stable appearance of parenchymal scarring in the right lung period.    No visits with results within 3 Day(s) from this visit.  Latest known visit with results is:  Appointment on 07/21/2019  Component Date Value Ref Range Status  . CEA 07/21/2019 8.2* 0.0 - 4.7 ng/mL Final   Comment: (NOTE)                             Nonsmokers          <3.9                             Smokers             <5.6 [employer] Electrochemiluminescence Immunoassay (ECLIA) Values obtained with different assay methods or kits cannot be used interchangeably.  Results cannot be interpreted as absolute  evidence of the presence or absence of malignant disease. Performed At: [facility] [place], [place] [id number] Rush Farmer MD DU:[id number]   . Sodium 07/21/2019 135  135 - 145 mmol/L Final  . Potassium 07/21/2019 4.0  3.5 - 5.1 mmol/L Final  . Chloride 07/21/2019 101  98 - 111 mmol/L Final  . CO2 07/21/2019 28  22 - 32 mmol/L Final  . Glucose, Bld 07/21/2019 165* 70 - 99 mg/dL Final  . BUN 07/21/2019 15  8 - 23 mg/dL Final  . Creatinine, Ser 07/21/2019 0.80  0.44 - 1.00 mg/dL Final  . Calcium 07/21/2019 8.9  8.9 - 10.3 mg/dL Final  . Total Protein 07/21/2019 7.9  6.5 - 8.1 g/dL Final  . Albumin 07/21/2019 4.2  3.5 - 5.0 g/dL Final  . AST 07/21/2019 37  15 - 41 U/L Final  . ALT 07/21/2019 26  0 - 44 U/L Final  . Alkaline Phosphatase 07/21/2019 72  38 - 126 U/L Final  . Total Bilirubin 07/21/2019 0.6  0.3 - 1.2 mg/dL Final  . GFR calc non Af Amer 07/21/2019 >60  >60 mL/min Final  . GFR calc Af Amer 07/21/2019 >60  >60 mL/min Final  . Anion gap 07/21/2019 6  5 - 15 Final   Performed at [facility], [address] [place], [place] [address]  . WBC 07/21/2019 6.1  4.0 - 10.5 K/uL Final  . RBC 07/21/2019 3.99  3.87 - 5.11 MIL/uL Final  . Hemoglobin 07/21/2019 10.5* 12.0 - 15.0 g/dL Final  . HCT 07/21/2019 34.2* 36.0 - 46.0 % Final  . MCV 07/21/2019 85.7  80.0 - 100.0 fL Final  . MCH 07/21/2019 26.3  26.0 - 34.0 pg Final  .  MCHC 07/21/2019 30.7  30.0 - 36.0 g/dL Final  . RDW 07/21/2019 14.8  11.5 - 15.5 % Final  . Platelets 07/21/2019 284  150 - 400 K/uL Final  . nRBC 07/21/2019 0.0  0.0 - 0.2 % Final  . Neutrophils Relative % 07/21/2019 65  % Final  . Neutro Abs 07/21/2019 4.0  1.7 - 7.7 K/uL Final  . Lymphocytes Relative 07/21/2019 19  % Final  . Lymphs Abs 07/21/2019 1.2  0.7 - 4.0 K/uL Final  . Monocytes Relative 07/21/2019 11  % Final  . Monocytes Absolute 07/21/2019 0.7  0.1 - 1.0 K/uL Final  . Eosinophils Relative 07/21/2019 4  %  Final  . Eosinophils Absolute 07/21/2019 0.2  0.0 - 0.5 K/uL Final  . Basophils Relative 07/21/2019 1  % Final  . Basophils Absolute 07/21/2019 0.0  0.0 - 0.1 K/uL Final  . Immature Granulocytes 07/21/2019 0  % Final  . Abs Immature Granulocytes 07/21/2019 0.01  0.00 - 0.07 K/uL Final   Performed at [facility], [address] [place], [place] [address]    Assessment:  Angela Jensen is a 63 y.o. 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 She was not a good surgery candidate secondary to her lung function (FEV1 28%).  PET scan on 08/03/2011 revealed a 10-mm spiculated right apical pulmonary nodule (SUV 2.5) suspcious for lung cancer. Left ovary was prominent (3.9 x 2.4 cm). CA-125 was 9.3 (normal).  CT guided biopsy of the superior and inferior RUL noduleson 12/30/2011 revealed well differentiated adenocarcinoma of lung primary. EGFR testing revealed no mutation. She received IMRT 6000 cGyto both nodules from 01/26/2012 - 03/10/2012.  PET scanon 01/30/2015 revealed a hypermetabolic and enlarging RUL pulmonary nodules concerning for lung cancer recurrence. There was a small focus of peripheral nodular consolidation in the superior segment of the right lower lobe with differential including inflammation/infection versus neoplasm. There was no evidence of mediastinal nodal metastasis.   In 04/2015, there was an attempt at radiofrequency ablation of 1 enlarging RUL nodule. At the time of the procedure, the nodule had shrunk to 5 mm from 9 mm.  Chest CTon 01/19/2018 revealed a subsolid 2.1 cm peripheral right lower lobe pulmonary nodule. Although not appreciably changed in size, subjectively this nodule had increased in density, and remains suspicious for primary bronchogenic adenocarcinoma. There was no thoracic adenopathy.  CT guided RLL biopsyon 02/09/2018 revealed adenocarcinoma of lung origin, lepidic and  papillary patterns.   She received6000 cGy in 5 fractions toRLL nodulefrom 03/22/2018 - 04/05/2018.  Chest CT on 07/19/2019 revealed a 6 x 6 mm nodule in the right lower lobe had increased in size from 3 mm previously. The nodule was separate and discrete from the previous treatment beds. Metachronous or metastatic disease a concern. Close CT follow up was recommended. There was a 5 mm right lower lobe pulmonary nodule new in the interval. Close attention on follow up recommended. There was stable appearance of parenchymal scarring in the right lung period.   She has an elevated CEAof unclear etiology. CEA was 8.1 on 01/31/2016, 9.2 on 03/03/2016, 7.9 on 10/12/2017, and 7.9 on 01/19/2019.  Colonoscopyon 06/25/2015 revealed a 4 mm polyp in the sigmoid colon. Pathology revealed a tubular adenoma without evidence of dysplasia or malignancy.  Pelvic MRIon 04/30/2016 revealed a 3.4 x 2.5 cm ovoid mass in the LEFT adnexa with signal characteristics identical to the intramural leiomyomas and consistent with an exophytic leiomyoma. There  were multiple uterine leiomyomas. Endometrium was normal with a normal junctional zone. Ovaries were not identified.  Symptomatically, she feels "alright".  She has stable shortness of breath with exertion.  Plan: 1.  Labs today: CBC with diff, CMP, CEA. 2.Stage I adenocarcinoma RUL Patient is s/p IMRT (completed 03/10/2012). Review chest CT from 12/02.  Images personally review.  Agree with radiology.   Images reveal an enlarging RLL nodule (3 to 6 mm).   Nodule too small to biopsy.   Discuss plans for close surveillance. Schedule chest CT on 10/17/2019. 3.RLL adenocarcinoma Patient is s/p SBRT (completed 04/05/2018). As above, chest CT reveals an enlarging RLL nodule.  Anticipate SBRT if nodule continues to grow. 4. Elevated total protein             Protein 8.3 (6.5-8.1) on  01/19/2019.             M-spike and FLCA were normal. 5.   RTC after chest CT for MD assessment, review of chest CT, and discussion regarding direction of therapy.  I discussed the assessment and treatment plan with the patient.  The patient was provided an opportunity to ask questions and all were answered.  The patient agreed with the plan and demonstrated an understanding of the instructions.  The patient was advised to call back or seek an in person evaluation if the symptoms worsen or if the condition fails to improve as anticipated.  I provided 8 minutes (12:30 PM - 12:37 PM) of non face-to-face video visit time during this this encounter and > 50% was spent counseling as documented under my assessment and plan.  I provided these services from the [facility] office.   Nolon Stalls, MD, PhD  07/25/2019, 12:30 PM  I, Selena Batten, am acting as scribe for [employer]. Mike Gip, MD, PhD.  I, Melissa C. Mike Gip, MD, have reviewed the above documentation for accuracy and completeness, and I agree with the above.

## 2019-07-24 ENCOUNTER — Encounter: Payer: Self-pay | Admitting: Hematology and Oncology

## 2019-07-24 NOTE — Progress Notes (Signed)
No new changes today noted. The patient Name and DOB has been verified by phone today.

## 2019-07-25 ENCOUNTER — Inpatient Hospital Stay (HOSPITAL_BASED_OUTPATIENT_CLINIC_OR_DEPARTMENT_OTHER): Payer: BLUE CROSS/BLUE SHIELD | Admitting: Hematology and Oncology

## 2019-07-25 DIAGNOSIS — C3411 Malignant neoplasm of upper lobe, right bronchus or lung: Secondary | ICD-10-CM | POA: Diagnosis not present

## 2019-07-25 DIAGNOSIS — R778 Other specified abnormalities of plasma proteins: Secondary | ICD-10-CM | POA: Diagnosis not present

## 2019-07-25 DIAGNOSIS — C3431 Malignant neoplasm of lower lobe, right bronchus or lung: Secondary | ICD-10-CM

## 2019-07-27 ENCOUNTER — Ambulatory Visit: Payer: BLUE CROSS/BLUE SHIELD | Admitting: Radiation Oncology

## 2019-07-28 ENCOUNTER — Ambulatory Visit: Payer: BLUE CROSS/BLUE SHIELD | Admitting: Hematology and Oncology

## 2019-08-04 ENCOUNTER — Ambulatory Visit (INDEPENDENT_AMBULATORY_CARE_PROVIDER_SITE_OTHER): Payer: BLUE CROSS/BLUE SHIELD | Admitting: Vascular Surgery

## 2019-08-04 ENCOUNTER — Other Ambulatory Visit: Payer: Self-pay

## 2019-08-04 ENCOUNTER — Encounter (INDEPENDENT_AMBULATORY_CARE_PROVIDER_SITE_OTHER): Payer: Self-pay | Admitting: Vascular Surgery

## 2019-08-04 ENCOUNTER — Ambulatory Visit (INDEPENDENT_AMBULATORY_CARE_PROVIDER_SITE_OTHER): Payer: BLUE CROSS/BLUE SHIELD

## 2019-08-04 VITALS — BP 172/90 | HR 114 | Resp 16 | Ht 59.0 in | Wt 120.0 lb

## 2019-08-04 DIAGNOSIS — I10 Essential (primary) hypertension: Secondary | ICD-10-CM

## 2019-08-04 DIAGNOSIS — I739 Peripheral vascular disease, unspecified: Secondary | ICD-10-CM | POA: Diagnosis not present

## 2019-08-04 DIAGNOSIS — E1165 Type 2 diabetes mellitus with hyperglycemia: Secondary | ICD-10-CM | POA: Diagnosis not present

## 2019-08-04 DIAGNOSIS — E785 Hyperlipidemia, unspecified: Secondary | ICD-10-CM

## 2019-08-04 NOTE — Progress Notes (Signed)
MRN : [id number]  Angela Jensen is a 63 y.o. (Jul 06, 1956) female who presents with chief complaint of  Chief Complaint  Patient presents with  . Follow-up    ultrasound  .  History of Present Illness: Patient returns today in follow up of her peripheral arterial disease.  We have done multiple interventions on her over the years the most recent being almost a year ago on the left leg.  She says it current, her legs are doing well and she is really not having any significant claudication, ischemic rest pain, or ulceration.  Her ABIs today have dropped some down to 0.84 on the right and 0.63 on the left.  Her right waveforms are monophasic but strong and her digital pressure remains normal although her left digital pressure has dropped to 56 and is more blunted.  Current Outpatient Medications  Medication Sig Dispense Refill  . Accu-Chek FastClix Lancets MISC Use as directed twice a day diag E11.65 100 each 1  . albuterol (PROVENTIL) (2.5 MG/3ML) 0.083% nebulizer solution Take 3 mLs (2.5 mg total) by nebulization every 6 (six) hours as needed for wheezing. 75 mL 3  . albuterol (VENTOLIN HFA) 108 (90 Base) MCG/ACT inhaler Inhale 2 puffs into the lungs every 4 (four) hours as needed for wheezing or shortness of breath. 18 g 3  . amLODipine (NORVASC) 10 MG tablet Take 1 tablet (10 mg total) by mouth every morning. 30 tablet 5  . aspirin EC 81 MG tablet Take 1 tablet (81 mg total) by mouth daily. 150 tablet 2  . buPROPion (WELLBUTRIN XL) 150 MG 24 hr tablet TAKE 1 TABLET BY MOUTH TWICE DAILY FOR SMOKING CESSATION 60 tablet 6  . clopidogrel (PLAVIX) 75 MG tablet Take 1 tablet (75 mg total) by mouth daily. 30 tablet 11  . gabapentin (NEURONTIN) 100 MG capsule Take 1 capsule (100 mg total) by mouth 2 (two) times daily. 60 capsule 5  . glucose blood (ACCU-CHEK AVIVA PLUS) test strip Use as instructed to check blood sugars three times daily.  E11.65 250 each 5  . lisinopril-hydrochlorothiazide  (PRINZIDE,ZESTORETIC) 10-12.5 MG tablet Take 1 tablet by mouth every morning. 30 tablet 5  . meloxicam (MOBIC) 7.5 MG tablet Take 1 tablet (7.5 mg total) by mouth 2 (two) times daily. 60 tablet 3  . metFORMIN (GLUCOPHAGE-XR) 500 MG 24 hr tablet Take 1 tablet (500 mg total) by mouth daily with breakfast. 30 tablet 3  . mometasone-formoterol (DULERA) 200-5 MCG/ACT AERO Inhale 1 puff into the lungs 2 (two) times daily. Please fill as 90 day prescription 3 Inhaler 3  . OXYGEN Inhale 2 L into the lungs at bedtime.    . potassium chloride (K-DUR) 10 MEQ tablet TAKE 1 TABLET BY MOUTH EVERY OTHER DAY FOR  LOW  POTASSIUM 45 tablet 3  . rOPINIRole (REQUIP) 0.5 MG tablet Take 1 tablet (0.5 mg total) by mouth 2 (two) times daily. 180 tablet 0  . rosuvastatin (CRESTOR) 10 MG tablet Take 1 tablet (10 mg total) by mouth at bedtime. 30 tablet 5  . sitaGLIPtin (JANUVIA) 25 MG tablet Take 1 tablet (25 mg total) by mouth daily. 30 tablet 3  . tiotropium (SPIRIVA HANDIHALER) 18 MCG inhalation capsule Place 1 capsule (18 mcg total) into inhaler and inhale daily. 90 capsule 2  . ALPRAZolam (XANAX) 0.25 MG tablet May take 1/2 to 1 tablet po one time one to two hours prior to flight. May repeat dose in one hour as needed for persistent  anxiety. (Patient not taking: Reported on 07/24/2019) 10 tablet 0   No current facility-administered medications for this visit.    Past Medical History:  Diagnosis Date  . Arthritis   . Asthma   . Atherosclerosis of native arteries of extremity with intermittent claudication ([place]) 05/26/2016  . Cancer [facility]) 2012   Right Lung CA  . COPD (chronic obstructive pulmonary disease) ([place])   . Depression   . Diabetes mellitus without complication [facility])    Patient takes Janumet  . Essential hypertension 05/26/2016  . Hypercholesteremia   . Hypertension   . 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 PAD (peripheral artery disease) ([place]) 06/22/2016  . Peripheral vascular disease ([place])   .  Personal history of radiation therapy   . Shortness of breath dyspnea    with exertion   . Sleep apnea     Past Surgical History:  Procedure Laterality Date  . CESAREAN SECTION     x3  . COLONOSCOPY WITH PROPOFOL N/A 06/25/2015   Procedure: COLONOSCOPY WITH PROPOFOL;  Surgeon: Lucilla Lame, MD;  Location: [facility] ENDOSCOPY;  Service: Endoscopy;  Laterality: N/A;  . CYST REMOVAL LEG     and on shoulder   . LOWER EXTREMITY ANGIOGRAPHY Left 09/29/2018   Procedure: LOWER EXTREMITY ANGIOGRAPHY;  Surgeon: Algernon Huxley, MD;  Location: [place] [facility];  Service: Cardiovascular;  Laterality: Left;  . LUNG BIOPSY  05 15 2013   has lung "spots"  . PERIPHERAL VASCULAR CATHETERIZATION Left 06/01/2016   Procedure: Lower Extremity Angiography;  Surgeon: Algernon Huxley, MD;  Location: [place] [facility];  Service: Cardiovascular;  Laterality: Left;  . PERIPHERAL VASCULAR CATHETERIZATION N/A 06/01/2016   Procedure: Abdominal Aortogram w/Lower Extremity;  Surgeon: Algernon Huxley, MD;  Location: [place] [facility];  Service: Cardiovascular;  Laterality: N/A;  . PERIPHERAL VASCULAR CATHETERIZATION  06/01/2016   Procedure: Lower Extremity Intervention;  Surgeon: Algernon Huxley, MD;  Location: [place] [facility];  Service: Cardiovascular;;  . PERIPHERAL VASCULAR CATHETERIZATION Right 06/08/2016   Procedure: Lower Extremity Angiography;  Surgeon: Algernon Huxley, MD;  Location: [place] [facility];  Service: Cardiovascular;  Laterality: Right;  . PERIPHERAL VASCULAR CATHETERIZATION  06/08/2016   Procedure: Lower Extremity Intervention;  Surgeon: Algernon Huxley, MD;  Location: [place] [facility];  Service: Cardiovascular;;   Family History  Problem Relation Age of Onset  . Diabetes Mother   . Lung cancer Father   . Diabetes Sister   . Diabetes Maternal Grandmother   . Diabetes Paternal Grandmother   . Diabetes Sister             Social History  Substance Use Topics  . Smoking status:  Former Smoker    Packs/day: 1.00    Years: 30.00    Types: Cigarettes    Quit date: 02/06/2010  . Smokeless tobacco: Former Systems developer    Types: Snuff  . Alcohol use No     Comment: hx of etoh use   No IV drug use  No Known Allergies     REVIEW OF SYSTEMS(Negative unless checked)  Constitutional: [] ???Weight loss[] ???Fever[] ???Chills Cardiac:[] ???Chest pain[] ???Chest pressure[] ???Palpitations [] ???Shortness of breath when laying flat [] ???Shortness of breath at rest [x] ???Shortness of breath with exertion. Vascular: [x] ???Pain in legs with walking[] ???Pain in legsat rest[] ???Pain in legs when laying flat [x] ???Claudication [] ???Pain in feet when walking [] ???Pain in feet at rest [] ???Pain in feet when laying flat [] ???History of DVT [] ???Phlebitis [] ???Swelling in legs [] ???Varicose veins [] ???Non-healing  ulcers Pulmonary: [x] ???Uses home oxygen [] ???Productive cough[] ???Hemoptysis [] ???Wheeze [x] ???COPD [] ???Asthma Neurologic: [] ???Dizziness [] ???Blackouts [] ???Seizures [] ???History of stroke [] ???History of TIA[] ???Aphasia [] ???Temporary blindness[] ???Dysphagia [] ???Weaknessor numbness in arms [] ???Weakness or numbnessin legs Musculoskeletal: [x] ???Arthritis [] ???Joint swelling [] ???Joint pain [] ???Low back pain Hematologic:[] ???Easy bruising[] ???Easy bleeding [] ???Hypercoagulable state [] ???Anemic [] ???Hepatitis Gastrointestinal:[] ???Blood in stool[] ???Vomiting blood[] ???Gastroesophageal reflux/heartburn[] ???Abdominal pain Genitourinary: [] ???Chronic kidney disease [] ???Difficulturination [] ???Frequenturination [] ???Burning with urination[] ???Hematuria Skin: [] ???Rashes [] ???Ulcers [] ???Wounds Psychological: [] ???History of anxiety[] ???History of major depression.   Physical Examination  BP (!) 172/90 (BP Location: Right Arm)   Pulse (!) 114    Resp 16   Ht 4\' 11"  (1.499 m)   Wt 120 lb (54.4 kg)   BMI 24.24 kg/m  Gen:  WD/WN, NAD Head: [place]/AT, No temporalis wasting. Ear/Nose/Throat: Hearing grossly intact, nares w/o erythema or drainage Eyes: Conjunctiva clear. Sclera non-icteric Neck: Supple.  Trachea midline Pulmonary:  Good air movement, no use of accessory muscles.  Cardiac: RRR, no JVD Vascular:  Vessel Right Left  Radial Palpable Palpable                          PT  1+ palpable  1+ palpable  DP  2+ palpable  1+ palpable   Gastrointestinal: soft, non-tender/non-distended. No guarding/reflex.  Musculoskeletal: M/S 5/5 throughout.  No deformity or atrophy.  No edema. Neurologic: Sensation grossly intact in extremities.  Symmetrical.  Speech is fluent.  Psychiatric: Judgment intact, Mood & affect appropriate for pt's clinical situation. Dermatologic: No rashes or ulcers noted.  No cellulitis or open wounds.       Labs Recent Results (from the past 2160 hour(s))  POCT HgB A1C     Status: Abnormal   Collection Time: 06/23/19  9:29 AM  Result Value Ref Range   Hemoglobin A1C 6.2 (A) 4.0 - 5.6 %   HbA1c POC (<> result, manual entry)     HbA1c, POC (prediabetic range)     HbA1c, POC (controlled diabetic range)    CEA     Status: Abnormal   Collection Time: 07/21/19 11:43 AM  Result Value Ref Range   CEA 8.2 (H) 0.0 - 4.7 ng/mL    Comment: (NOTE)                             Nonsmokers          <3.9                             Smokers             <5.6 Roche [employer] Electrochemiluminescence Immunoassay (ECLIA) Values obtained with different assay methods or kits cannot be used interchangeably.  Results cannot be interpreted as absolute evidence of the presence or absence of malignant disease. Performed At: [facility] [place], [place] [id number] Rush Farmer MD WU:[id number]   Comprehensive metabolic panel     Status: Abnormal   Collection Time: 07/21/19 11:43 AM  Result  Value Ref Range   Sodium 135 135 - 145 mmol/L   Potassium 4.0 3.5 - 5.1 mmol/L   Chloride 101 98 - 111 mmol/L   CO2 28 22 - 32 mmol/L   Glucose, Bld 165 (H) 70 - 99 mg/dL   BUN 15 8 - 23 mg/dL   Creatinine, Ser 0.80 0.44 - 1.00 mg/dL   Calcium 8.9 8.9 - 10.3 mg/dL   Total Protein 7.9 6.5 - 8.1 g/dL  Albumin 4.2 3.5 - 5.0 g/dL   AST 37 15 - 41 U/L   ALT 26 0 - 44 U/L   Alkaline Phosphatase 72 38 - 126 U/L   Total Bilirubin 0.6 0.3 - 1.2 mg/dL   GFR calc non Af Amer >60 >60 mL/min   GFR calc Af Amer >60 >60 mL/min   Anion gap 6 5 - 15    Comment: Performed at [facility], [address], [place] [address]  CBC with Differential/Platelet     Status: Abnormal   Collection Time: 07/21/19 11:43 AM  Result Value Ref Range   WBC 6.1 4.0 - 10.5 K/uL   RBC 3.99 3.87 - 5.11 MIL/uL   Hemoglobin 10.5 (L) 12.0 - 15.0 g/dL   HCT 34.2 (L) 36.0 - 46.0 %   MCV 85.7 80.0 - 100.0 fL   MCH 26.3 26.0 - 34.0 pg   MCHC 30.7 30.0 - 36.0 g/dL   RDW 14.8 11.5 - 15.5 %   Platelets 284 150 - 400 K/uL   nRBC 0.0 0.0 - 0.2 %   Neutrophils Relative % 65 %   Neutro Abs 4.0 1.7 - 7.7 K/uL   Lymphocytes Relative 19 %   Lymphs Abs 1.2 0.7 - 4.0 K/uL   Monocytes Relative 11 %   Monocytes Absolute 0.7 0.1 - 1.0 K/uL   Eosinophils Relative 4 %   Eosinophils Absolute 0.2 0.0 - 0.5 K/uL   Basophils Relative 1 %   Basophils Absolute 0.0 0.0 - 0.1 K/uL   Immature Granulocytes 0 %   Abs Immature Granulocytes 0.01 0.00 - 0.07 K/uL    Comment: Performed at [facility], [address], [place],  [address]    Radiology CT CHEST WO CONTRAST  Result Date: 07/19/2019 CLINICAL DATA:  History of right upper and lower lobe lung cancer. EXAM: CT CHEST WITHOUT CONTRAST TECHNIQUE: Multidetector CT imaging of the chest was performed following the standard protocol without IV contrast. COMPARISON:  01/17/2019 FINDINGS: Cardiovascular: The heart size is normal. No substantial  pericardial effusion. Coronary artery calcification is evident. Atherosclerotic calcification is noted in the wall of the thoracic aorta. Mediastinum/Nodes: No mediastinal lymphadenopathy. No evidence for gross hilar lymphadenopathy although assessment is limited by the lack of intravenous contrast on today's study. The esophagus has normal imaging features. There is no axillary lymphadenopathy. Lungs/Pleura: Centrilobular and paraseptal emphysema evident. Stable focus of scarring in the right upper lobe. Platelike scarring in the right lung along the minor fissure is also stable. The treatment bed in the right lower lobe along the oblique fissure is stable including the small nodular component visible on 93/3 today. 6 x 6 mm right lower lobe nodule (107/3) has progressed from 3 x 3 mm previously. 5 mm right lower lobe nodule on 106/3 is new in the interval. No suspicious nodule or mass in the left lung. No pleural effusion. Upper Abdomen: Unremarkable. Musculoskeletal: No worrisome lytic or sclerotic osseous abnormality. IMPRESSION: 1. 6 x 6 mm nodule in the right lower lobe has increased in size from 3 mm previously. This nodule is separate and discrete from the previous treatment beds. Metachronous or metastatic disease a concern. Close CT follow-up recommended. 2. 5 mm right lower lobe pulmonary nodule is new in the interval. Close attention on follow-up recommended. 3. Stable appearance of parenchymal scarring in the right lung period 4.  Aortic Atherosclerois (ICD10-170.0) 5.  Emphysema. ([id number]) Electronically Signed   By: Verda Cumins.D.  On: 07/19/2019 14:58    Assessment/Plan Essential hypertension blood pressure control important in reducing the progression of atherosclerotic disease. On appropriate oral medications.   Diabetes ([place]) blood glucose control important in reducing the progression of atherosclerotic disease. Also, involved in wound healing. On appropriate  medications.   Hyperlipidemia lipid control important in reducing the progression of atherosclerotic disease. Continue statin therapy  PAD (peripheral artery disease) ([place]) Her ABIs today have dropped some down to 0.84 on the right and 0.63 on the left.  Her right waveforms are monophasic but strong and her digital pressure remains normal although her left digital pressure has dropped to 56 and is more blunted. Symptomatically, she is doing well but clearly she has had some drop in her perfusion.  I would like to shorten her interval follow-up to 3 months and we will plan a left leg duplex as well as ABIs at her next visit.  Continue current medical regimen.  Contact our office with any worsening symptoms.    [name], MD  08/04/2019 11:14 AM    This note was created with Dragon medical transcription system.  Any errors from dictation are purely unintentional

## 2019-08-04 NOTE — Assessment & Plan Note (Signed)
Her ABIs today have dropped some down to 0.84 on the right and 0.63 on the left.  Her right waveforms are monophasic but strong and her digital pressure remains normal although her left digital pressure has dropped to 56 and is more blunted. Symptomatically, she is doing well but clearly she has had some drop in her perfusion.  I would like to shorten her interval follow-up to 3 months and we will plan a left leg duplex as well as ABIs at her next visit.  Continue current medical regimen.  Contact our office with any worsening symptoms.

## 2019-08-09 ENCOUNTER — Other Ambulatory Visit: Payer: Self-pay | Admitting: Adult Health

## 2019-08-09 DIAGNOSIS — E1165 Type 2 diabetes mellitus with hyperglycemia: Secondary | ICD-10-CM

## 2019-08-09 NOTE — Telephone Encounter (Signed)
Advise pt his potassium is ok and continue to take medicine per cardiology, Lisinopril was discontinued

## 2019-08-10 ENCOUNTER — Telehealth: Payer: Self-pay

## 2019-08-10 NOTE — Telephone Encounter (Signed)
Called pt to notify her to disregard the message about discontinuing lisinopril. Let pt know that she should continue her lisinopril as she is taking.

## 2019-08-10 NOTE — Telephone Encounter (Signed)
Called pt Angela Jensen to disregard my message yesterday about d/c lisinopril. Told pt to continue taking her lisinopril as prescribed.

## 2019-08-15 ENCOUNTER — Other Ambulatory Visit: Payer: Self-pay | Admitting: Adult Health

## 2019-08-15 ENCOUNTER — Other Ambulatory Visit: Payer: Self-pay

## 2019-08-15 DIAGNOSIS — J449 Chronic obstructive pulmonary disease, unspecified: Secondary | ICD-10-CM

## 2019-08-15 MED ORDER — MELOXICAM 7.5 MG PO TABS: 7.5000 mg | ORAL_TABLET | Freq: Two times a day (BID) | ORAL | 3 refills | Status: DC

## 2019-08-21 ENCOUNTER — Other Ambulatory Visit: Payer: Self-pay | Admitting: Adult Health

## 2019-08-21 ENCOUNTER — Other Ambulatory Visit: Payer: Self-pay

## 2019-08-21 MED ORDER — MELOXICAM 7.5 MG PO TABS: 7.5000 mg | ORAL_TABLET | Freq: Two times a day (BID) | ORAL | 3 refills | Status: DC

## 2019-09-01 ENCOUNTER — Other Ambulatory Visit: Payer: Self-pay

## 2019-09-04 ENCOUNTER — Ambulatory Visit
Admission: RE | Admit: 2019-09-04 | Discharge: 2019-09-04 | Disposition: A | Discharge status: Home / Self Care | Payer: BLUE CROSS/BLUE SHIELD | Source: Ambulatory Visit | Attending: Radiation Oncology | Admitting: Radiation Oncology

## 2019-09-04 ENCOUNTER — Other Ambulatory Visit: Payer: Self-pay

## 2019-09-04 ENCOUNTER — Other Ambulatory Visit: Payer: Self-pay | Admitting: *Deleted

## 2019-09-04 ENCOUNTER — Encounter: Payer: Self-pay | Admitting: Radiation Oncology

## 2019-09-04 VITALS — BP 167/84 | HR 103 | Temp 97.6°F | Resp 18 | Wt 123.0 lb

## 2019-09-04 DIAGNOSIS — Z85118 Personal history of other malignant neoplasm of bronchus and lung: Secondary | ICD-10-CM | POA: Insufficient documentation

## 2019-09-04 DIAGNOSIS — R911 Solitary pulmonary nodule: Secondary | ICD-10-CM | POA: Insufficient documentation

## 2019-09-04 DIAGNOSIS — Z87891 Personal history of nicotine dependence: Secondary | ICD-10-CM | POA: Diagnosis not present

## 2019-09-04 DIAGNOSIS — C3431 Malignant neoplasm of lower lobe, right bronchus or lung: Secondary | ICD-10-CM

## 2019-09-04 DIAGNOSIS — Z923 Personal history of irradiation: Secondary | ICD-10-CM | POA: Diagnosis not present

## 2019-09-04 NOTE — Progress Notes (Signed)
Radiation Oncology Follow up Note  Name: Angela Jensen   Date:   09/04/2019 MRN:  [id number] DOB: Feb 18, 1956    This 64 y.o. female presents to the clinic today for 61-month follow-up status post SBRT to her right lower lobe for stage I adenocarcinoma.  REFERRING PROVIDER: Ronnell Freshwater, NP  HPI: Patient is a 64 year old female now at 14 months having completed SBRT to her right lower lobe for stage I adenocarcinoma seen today in routine follow-up she is doing well.  She specifically [place] cough hemoptysis or chest tightness..  She had a CT scan last month showing 6 mm nodule in the right lower lobe which is increased from 3 mm previously.  There is also a 5 mm right lower lobe pulmonary nodule which is also new with close attention recommended.  COMPLICATIONS OF TREATMENT: none  FOLLOW UP COMPLIANCE: keeps appointments   PHYSICAL EXAM:  BP (!) 167/84   Pulse (!) 103   Temp 97.6 F (36.4 C)   Resp 18   Wt 123 lb (55.8 kg)   BMI 24.84 kg/m  Well-developed well-nourished patient in NAD. HEENT reveals PERLA, EOMI, discs not visualized.  Oral cavity is clear. No oral mucosal lesions are identified. Neck is clear without evidence of cervical or supraclavicular adenopathy. Lungs are clear to A&P. Cardiac examination is essentially unremarkable with regular rate and rhythm without murmur rub or thrill. Abdomen is benign with no organomegaly or masses noted. Motor sensory and DTR levels are equal and symmetric in the upper and lower extremities. Cranial nerves II through XII are grossly intact. Proprioception is intact. No peripheral adenopathy or edema is identified. No motor or sensory levels are noted. Crude visual fields are within normal range.  RADIOLOGY RESULTS: CT scans reviewed compatible with above-stated findings  PLAN: At the present time patient is doing well with low side effect profile.  I am pleased with her overall progress.  She does have some small new nodules which we will  keep an eye on I have asked to see her back in 6 months for follow-up with a CT scan prior to that visit.  Patient knows to call sooner with any concerns.  I would like to take this opportunity to thank you for allowing me to participate in the care of your patient.Noreene Filbert, MD

## 2019-09-05 ENCOUNTER — Telehealth: Payer: Self-pay

## 2019-09-05 NOTE — Telephone Encounter (Signed)
Confirmed virtual visit with patient. klh 

## 2019-09-07 ENCOUNTER — Other Ambulatory Visit: Payer: Self-pay

## 2019-09-07 ENCOUNTER — Other Ambulatory Visit (INDEPENDENT_AMBULATORY_CARE_PROVIDER_SITE_OTHER): Payer: Self-pay | Admitting: Nurse Practitioner

## 2019-09-07 ENCOUNTER — Ambulatory Visit: Payer: BLUE CROSS/BLUE SHIELD | Admitting: Internal Medicine

## 2019-09-07 ENCOUNTER — Encounter: Payer: Self-pay | Admitting: Internal Medicine

## 2019-09-07 VITALS — BP 166/94 | HR 101 | Ht 59.0 in | Wt 119.7 lb

## 2019-09-07 DIAGNOSIS — I10 Essential (primary) hypertension: Secondary | ICD-10-CM

## 2019-09-07 DIAGNOSIS — J449 Chronic obstructive pulmonary disease, unspecified: Secondary | ICD-10-CM

## 2019-09-07 DIAGNOSIS — C3431 Malignant neoplasm of lower lobe, right bronchus or lung: Secondary | ICD-10-CM

## 2019-09-07 DIAGNOSIS — I739 Peripheral vascular disease, unspecified: Secondary | ICD-10-CM

## 2019-09-07 DIAGNOSIS — Z9981 Dependence on supplemental oxygen: Secondary | ICD-10-CM

## 2019-09-07 DIAGNOSIS — E1165 Type 2 diabetes mellitus with hyperglycemia: Secondary | ICD-10-CM

## 2019-09-07 MED ORDER — METFORMIN HCL ER 500 MG PO TB24: 500.0000 mg | ORAL_TABLET | Freq: Every day | ORAL | 3 refills | Status: DC

## 2019-09-07 MED ORDER — LISINOPRIL-HYDROCHLOROTHIAZIDE 10-12.5 MG PO TABS: 1.0000 | ORAL_TABLET | Freq: Every morning | ORAL | 5 refills | Status: DC

## 2019-09-07 NOTE — Progress Notes (Addendum)
[facility] [place], [place] [address]  Internal MEDICINE  Telephone Visit  Patient Name: Angela Jensen  [id number]  [id number]  Date of Service: 09/07/2019  I connected with the patient at 1215 by telephone and verified the patients identity using two identifiers.   I discussed the limitations, risks, security and privacy concerns of performing an evaluation and management service by telephone and the availability of in person appointments. I also discussed with the patient that there may be a patient responsible charge related to the service.  The patient expressed understanding and agrees to proceed.    Chief Complaint  Patient presents with  . Telephone Assessment  . Telephone Screen  . COPD    HPI  Pt seen via video. Pt reports overall she is doing well.  She currently treats her COPD with spiriva, dulera,and  albuterol inhalers.  She also had a nebulizer that she uses regularly.  She denies any recent issues.  She had not had a hospitalization or any other difficulty  She is staying in and social distancing as much as possible. She uses 2 lpm of oxygen at night. She also has a history of RUL lung CA.  She is s/p radiation.  Her last CT on 07/19/2019 shows increased size of nodule form 54mm previously to 6x23mm.  Oncology recommends follow up CT in March 2021.      Current Medication: Outpatient Encounter Medications as of 09/07/2019  Medication Sig  . Accu-Chek FastClix Lancets MISC Use as directed twice a day diag E11.65  . albuterol (PROVENTIL) (2.5 MG/3ML) 0.083% nebulizer solution Take 3 mLs (2.5 mg total) by nebulization every 6 (six) hours as needed for wheezing.  Marland Kitchen ALPRAZolam (XANAX) 0.25 MG tablet May take 1/2 to 1 tablet po one time one to two hours prior to flight. May repeat dose in one hour as needed for persistent anxiety. (Patient not taking: Reported on 07/24/2019)  . amLODipine (NORVASC) 10 MG tablet Take 1 tablet (10 mg total) by mouth every  morning.  Marland Kitchen aspirin EC 81 MG tablet Take 1 tablet (81 mg total) by mouth daily.  Marland Kitchen buPROPion (WELLBUTRIN XL) 150 MG 24 hr tablet TAKE 1 TABLET BY MOUTH TWICE DAILY FOR SMOKING CESSATION  . gabapentin (NEURONTIN) 100 MG capsule Take 1 capsule by mouth twice daily  . glucose blood (ACCU-CHEK AVIVA PLUS) test strip Use as instructed to check blood sugars three times daily.  E11.65  . JANUVIA 25 MG tablet Take 1 tablet by mouth once daily  . meloxicam (MOBIC) 7.5 MG tablet Take 1 tablet (7.5 mg total) by mouth 2 (two) times daily.  . mometasone-formoterol (DULERA) 200-5 MCG/ACT AERO Inhale 1 puff into the lungs 2 (two) times daily. Please fill as 90 day prescription  . OXYGEN Inhale 2 L into the lungs at bedtime.  . potassium chloride (K-DUR) 10 MEQ tablet TAKE 1 TABLET BY MOUTH EVERY OTHER DAY FOR  LOW  POTASSIUM  . rOPINIRole (REQUIP) 0.5 MG tablet Take 1 tablet (0.5 mg total) by mouth 2 (two) times daily.  . rosuvastatin (CRESTOR) 10 MG tablet Take 1 tablet (10 mg total) by mouth at bedtime.  Marland Kitchen SPIRIVA HANDIHALER 18 MCG inhalation capsule PLACE 1 CAPSULE INTO INHALER AND INHALE ONCE DAILY  . VENTOLIN HFA 108 (90 Base) MCG/ACT inhaler INHALE 2 PUFFS INTO LUNGS EVERY 4 HOURS AS NEEDED FOR WHEEZING OR SHORTNESS OF BREATH  . [DISCONTINUED] clopidogrel (PLAVIX) 75 MG tablet Take 1 tablet (75 mg total)  by mouth daily.  . [DISCONTINUED] lisinopril-hydrochlorothiazide (PRINZIDE,ZESTORETIC) 10-12.5 MG tablet Take 1 tablet by mouth every morning.  . [DISCONTINUED] metFORMIN (GLUCOPHAGE-XR) 500 MG 24 hr tablet Take 1 tablet (500 mg total) by mouth daily with breakfast.   No facility-administered encounter medications on file as of 09/07/2019.    Surgical History: Past Surgical History:  Procedure Laterality Date  . CESAREAN SECTION     x3  . COLONOSCOPY WITH PROPOFOL N/A 06/25/2015   Procedure: COLONOSCOPY WITH PROPOFOL;  Surgeon: Lucilla Lame, MD;  Location: [facility] ENDOSCOPY;  Service: Endoscopy;   Laterality: N/A;  . CYST REMOVAL LEG     and on shoulder   . LOWER EXTREMITY ANGIOGRAPHY Left 09/29/2018   Procedure: LOWER EXTREMITY ANGIOGRAPHY;  Surgeon: Algernon Huxley, MD;  Location: [place] [facility];  Service: Cardiovascular;  Laterality: Left;  . LUNG BIOPSY  05 15 2013   has lung "spots"  . PERIPHERAL VASCULAR CATHETERIZATION Left 06/01/2016   Procedure: Lower Extremity Angiography;  Surgeon: Algernon Huxley, MD;  Location: [place] [facility];  Service: Cardiovascular;  Laterality: Left;  . PERIPHERAL VASCULAR CATHETERIZATION N/A 06/01/2016   Procedure: Abdominal Aortogram w/Lower Extremity;  Surgeon: Algernon Huxley, MD;  Location: [place] [facility];  Service: Cardiovascular;  Laterality: N/A;  . PERIPHERAL VASCULAR CATHETERIZATION  06/01/2016   Procedure: Lower Extremity Intervention;  Surgeon: Algernon Huxley, MD;  Location: [place] [facility];  Service: Cardiovascular;;  . PERIPHERAL VASCULAR CATHETERIZATION Right 06/08/2016   Procedure: Lower Extremity Angiography;  Surgeon: Algernon Huxley, MD;  Location: [place] [facility];  Service: Cardiovascular;  Laterality: Right;  . PERIPHERAL VASCULAR CATHETERIZATION  06/08/2016   Procedure: Lower Extremity Intervention;  Surgeon: Algernon Huxley, MD;  Location: [place] [facility];  Service: Cardiovascular;;    Medical History: Past Medical History:  Diagnosis Date  . Arthritis   . Asthma   . Atherosclerosis of native arteries of extremity with intermittent claudication ([place]) 05/26/2016  . Cancer [facility]) 2012   Right Lung CA  . COPD (chronic obstructive pulmonary disease) ([place])   . Depression   . Diabetes mellitus without complication [facility])    Patient takes Janumet  . Essential hypertension 05/26/2016  . Hypercholesteremia   . Hypertension   . 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 PAD (peripheral artery disease) ([place]) 06/22/2016  . Peripheral vascular disease ([place])   . Personal history of radiation therapy   . Shortness of breath  dyspnea    with exertion   . Sleep apnea     Family History: Family History  Problem Relation Age of Onset  . Lung cancer Father   . Diabetes Mother   . Hypercholesterolemia Mother   . Diabetes Sister   . Diabetes Maternal Grandmother   . Diabetes Paternal Grandmother   . Diabetes Sister   . Heart attack Brother   . Coronary artery disease Brother   . Vascular Disease Brother   . Hypertension Sister   . Heart attack Brother     Social History   Socioeconomic History  . Marital status: Widowed    Spouse name: Not on file  . Number of children: Not on file  . Years of education: Not on file  . Highest education level: Not on file  Occupational History  . Not on file  Tobacco Use  . Smoking status: Former Smoker    Packs/day: 1.00    Years: 37.00    Pack years: 37.00  Types: Cigarettes    Quit date: 02/06/2010    Years since quitting: 9.5  . Smokeless tobacco: Former Systems developer    Types: Snuff  Substance and Sexual Activity  . Alcohol use: Yes    Alcohol/week: 5.0 standard drinks    Types: 5 Cans of beer per week    Comment: /h x of alcohol abuse -stopped 2012- now drinks 5 beer per w  . Drug use: Not Currently    Types: Marijuana, "Crack" cocaine, Cocaine    Comment: hx of cocaine use- last use 2015; last use sat marijuana6/22/19,   . Sexual activity: Yes  Other Topics Concern  . Not on file  Social History Narrative  . Not on file   Social Determinants of Health   Financial Resource Strain:   . Difficulty of Paying Living Expenses: Not on file  Food Insecurity:   . Worried About Charity fundraiser in the Last Year: Not on file  . Ran Out of Food in the Last Year: Not on file  Transportation Needs:   . Lack of Transportation (Medical): Not on file  . Lack of Transportation (Non-Medical): Not on file  Physical Activity:   . Days of Exercise per Week: Not on file  . Minutes of Exercise per Session: Not on file  Stress:   . Feeling of Stress : Not on file   Social Connections:   . Frequency of Communication with Friends and Family: Not on file  . Frequency of Social Gatherings with Friends and Family: Not on file  . Attends Religious Services: Not on file  . Active Member of Clubs or Organizations: Not on file  . Attends Archivist Meetings: Not on file  . Marital Status: Not on file  Intimate Partner Violence:   . Fear of Current or Ex-Partner: Not on file  . Emotionally Abused: Not on file  . Physically Abused: Not on file  . Sexually Abused: Not on file      Review of Systems  Constitutional: Negative for chills, fatigue and unexpected weight change.  HENT: Negative for congestion, rhinorrhea, sneezing and sore throat.   Eyes: Negative for photophobia, pain and redness.  Respiratory: Negative for cough, chest tightness and shortness of breath.   Cardiovascular: Negative for chest pain and palpitations.  Gastrointestinal: Negative for abdominal pain, constipation, diarrhea, nausea and vomiting.  Endocrine: Negative.   Genitourinary: Negative for dysuria and frequency.  Musculoskeletal: Negative for arthralgias, back pain, joint swelling and neck pain.  Skin: Negative for rash.  Allergic/Immunologic: Negative.   Neurological: Negative for tremors and numbness.  Hematological: Negative for adenopathy. Does not bruise/bleed easily.  Psychiatric/Behavioral: Negative for behavioral problems and sleep disturbance. The patient is not nervous/anxious.     Vital Signs: BP (!) 166/94   Pulse (!) 101   Ht 4\' 11"  (1.499 m)   Wt 119 lb 11.2 oz (54.3 kg)   BMI 24.18 kg/m    Observation/Objective:  Well sounding, NAD noted. Speaking in full sentences.   Assessment/Plan: 1. Chronic obstructive pulmonary disease, unspecified COPD type ([place]) Stable, continue present management.  2. Essential hypertension Stable, continue present management  3. Oxygen dependent Continue to use oxygen as directed.  4. Cancer of lower  lobe of right lung ([place]) Continue to follow up with oncology for close surveliance of lung mass. They have tenative CT in March 2021  General Counseling: Angela Jensen verbalizes understanding of the findings of today's phone visit and agrees with plan of treatment. I have  discussed any further diagnostic evaluation that may be needed or ordered today. We also reviewed her medications today. she has been encouraged to call the office with any questions or concerns that should arise related to todays visit.    No orders of the defined types were placed in this encounter.   No orders of the defined types were placed in this encounter.   Time spent: 25 Minutes  This patient was seen by Orson Gear AGNP-C in Collaboration with Dr. Devona Konig as a part of collaborative care agreement.   Orson Gear AGNP-C Pulmonary medicine.

## 2019-09-11 ENCOUNTER — Other Ambulatory Visit: Payer: Self-pay

## 2019-09-11 MED ORDER — VENTOLIN HFA 108 (90 BASE) MCG/ACT IN AERS: INHALATION_SPRAY | RESPIRATORY_TRACT | 0 refills | Status: DC

## 2019-09-20 ENCOUNTER — Telehealth: Payer: Self-pay

## 2019-09-20 NOTE — Telephone Encounter (Signed)
CONFIRMED AND SCREENED FOR 09-22-19 OV.

## 2019-09-22 ENCOUNTER — Encounter: Payer: Self-pay | Admitting: Nurse Practitioner

## 2019-09-22 ENCOUNTER — Ambulatory Visit (INDEPENDENT_AMBULATORY_CARE_PROVIDER_SITE_OTHER): Payer: BLUE CROSS/BLUE SHIELD | Admitting: Nurse Practitioner

## 2019-09-22 ENCOUNTER — Other Ambulatory Visit: Payer: Self-pay

## 2019-09-22 VITALS — BP 126/69 | HR 103 | Temp 97.4°F | Resp 16 | Ht 59.0 in | Wt 122.4 lb

## 2019-09-22 DIAGNOSIS — J449 Chronic obstructive pulmonary disease, unspecified: Secondary | ICD-10-CM | POA: Diagnosis not present

## 2019-09-22 DIAGNOSIS — M25532 Pain in left wrist: Secondary | ICD-10-CM | POA: Diagnosis not present

## 2019-09-22 DIAGNOSIS — C3431 Malignant neoplasm of lower lobe, right bronchus or lung: Secondary | ICD-10-CM

## 2019-09-22 DIAGNOSIS — E1165 Type 2 diabetes mellitus with hyperglycemia: Secondary | ICD-10-CM | POA: Diagnosis not present

## 2019-09-22 LAB — POCT GLYCOSYLATED HEMOGLOBIN (HGB A1C): Hemoglobin A1C: 6.5 % — AB (ref 4.0–5.6)

## 2019-09-22 MED ORDER — DICLOFENAC SODIUM 1 % EX GEL: 4.0000 g | Freq: Four times a day (QID) | CUTANEOUS | 2 refills | Status: DC

## 2019-09-22 NOTE — Progress Notes (Signed)
[facility] [place], [place] [address]  Internal MEDICINE  Office Visit Note  Patient Name: Angela Jensen  [id number]  [id number]  Date of Service: 09/24/2019  Chief Complaint  Patient presents with  . Diabetes  . Hypertension  . Hyperlipidemia  . Hand Pain    left hand   . Groin Pain    no issues with urine     The patient presents for follow up visit. She states that she has some tenderness in the left wrist. Hurts only when she turns it a certain way. She states that she sprained it a few years ago while still working. Never bothered her. Last few months has caused some tenderness.  Blood sugars are doing ok. Today, HgbA1c is 6.5. blood pressure is well managed. She states that she has no new concerns or complaintes today.       Current Medication: Outpatient Encounter Medications as of 09/22/2019  Medication Sig  . Accu-Chek FastClix Lancets MISC Use as directed twice a day diag E11.65  . albuterol (PROVENTIL) (2.5 MG/3ML) 0.083% nebulizer solution Take 3 mLs (2.5 mg total) by nebulization every 6 (six) hours as needed for wheezing.  Marland Kitchen ALPRAZolam (XANAX) 0.25 MG tablet May take 1/2 to 1 tablet po one time one to two hours prior to flight. May repeat dose in one hour as needed for persistent anxiety.  Marland Kitchen amLODipine (NORVASC) 10 MG tablet Take 1 tablet (10 mg total) by mouth every morning.  Marland Kitchen aspirin EC 81 MG tablet Take 1 tablet (81 mg total) by mouth daily.  Marland Kitchen buPROPion (WELLBUTRIN XL) 150 MG 24 hr tablet TAKE 1 TABLET BY MOUTH TWICE DAILY FOR SMOKING CESSATION  . clopidogrel (PLAVIX) 75 MG tablet Take 1 tablet by mouth once daily  . gabapentin (NEURONTIN) 100 MG capsule Take 1 capsule by mouth twice daily  . glucose blood (ACCU-CHEK AVIVA PLUS) test strip Use as instructed to check blood sugars three times daily.  E11.65  . JANUVIA 25 MG tablet Take 1 tablet by mouth once daily  . lisinopril-hydrochlorothiazide (ZESTORETIC) 10-12.5 MG tablet Take 1  tablet by mouth every morning.  . meloxicam (MOBIC) 7.5 MG tablet Take 1 tablet (7.5 mg total) by mouth 2 (two) times daily.  . metFORMIN (GLUCOPHAGE-XR) 500 MG 24 hr tablet Take 1 tablet (500 mg total) by mouth daily with breakfast.  . mometasone-formoterol (DULERA) 200-5 MCG/ACT AERO Inhale 1 puff into the lungs 2 (two) times daily. Please fill as 90 day prescription  . OXYGEN Inhale 2 L into the lungs at bedtime.  . potassium chloride (K-DUR) 10 MEQ tablet TAKE 1 TABLET BY MOUTH EVERY OTHER DAY FOR  LOW  POTASSIUM  . rOPINIRole (REQUIP) 0.5 MG tablet Take 1 tablet (0.5 mg total) by mouth 2 (two) times daily.  . rosuvastatin (CRESTOR) 10 MG tablet Take 1 tablet (10 mg total) by mouth at bedtime.  Marland Kitchen SPIRIVA HANDIHALER 18 MCG inhalation capsule PLACE 1 CAPSULE INTO INHALER AND INHALE ONCE DAILY  . VENTOLIN HFA 108 (90 Base) MCG/ACT inhaler INHALE 2 PUFFS INTO LUNGS EVERY 4 HOURS AS NEEDED FOR WHEEZING OR SHORTNESS OF BREATH  . diclofenac Sodium (VOLTAREN) 1 % GEL Apply 4 g topically 4 (four) times daily.   No facility-administered encounter medications on file as of 09/22/2019.    Surgical History: Past Surgical History:  Procedure Laterality Date  . CESAREAN SECTION     x3  . COLONOSCOPY WITH PROPOFOL N/A 06/25/2015   Procedure: COLONOSCOPY  WITH PROPOFOL;  Surgeon: Lucilla Lame, MD;  Location: [facility] ENDOSCOPY;  Service: Endoscopy;  Laterality: N/A;  . CYST REMOVAL LEG     and on shoulder   . LOWER EXTREMITY ANGIOGRAPHY Left 09/29/2018   Procedure: LOWER EXTREMITY ANGIOGRAPHY;  Surgeon: Algernon Huxley, MD;  Location: [place] [facility];  Service: Cardiovascular;  Laterality: Left;  . LUNG BIOPSY  05 15 2013   has lung "spots"  . PERIPHERAL VASCULAR CATHETERIZATION Left 06/01/2016   Procedure: Lower Extremity Angiography;  Surgeon: Algernon Huxley, MD;  Location: [place] [facility];  Service: Cardiovascular;  Laterality: Left;  . PERIPHERAL VASCULAR CATHETERIZATION N/A 06/01/2016   Procedure:  Abdominal Aortogram w/Lower Extremity;  Surgeon: Algernon Huxley, MD;  Location: [place] [facility];  Service: Cardiovascular;  Laterality: N/A;  . PERIPHERAL VASCULAR CATHETERIZATION  06/01/2016   Procedure: Lower Extremity Intervention;  Surgeon: Algernon Huxley, MD;  Location: [place] [facility];  Service: Cardiovascular;;  . PERIPHERAL VASCULAR CATHETERIZATION Right 06/08/2016   Procedure: Lower Extremity Angiography;  Surgeon: Algernon Huxley, MD;  Location: [place] [facility];  Service: Cardiovascular;  Laterality: Right;  . PERIPHERAL VASCULAR CATHETERIZATION  06/08/2016   Procedure: Lower Extremity Intervention;  Surgeon: Algernon Huxley, MD;  Location: [place] [facility];  Service: Cardiovascular;;    Medical History: Past Medical History:  Diagnosis Date  . Arthritis   . Asthma   . Atherosclerosis of native arteries of extremity with intermittent claudication ([place]) 05/26/2016  . Cancer [facility]) 2012   Right Lung CA  . COPD (chronic obstructive pulmonary disease) ([place])   . Depression   . Diabetes mellitus without complication [facility])    Patient takes Janumet  . Essential hypertension 05/26/2016  . Hypercholesteremia   . Hypertension   . 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 PAD (peripheral artery disease) ([place]) 06/22/2016  . Peripheral vascular disease ([place])   . Personal history of radiation therapy   . Shortness of breath dyspnea    with exertion   . Sleep apnea     Family History: Family History  Problem Relation Age of Onset  . Lung cancer Father   . Diabetes Mother   . Hypercholesterolemia Mother   . Diabetes Sister   . Diabetes Maternal Grandmother   . Diabetes Paternal Grandmother   . Diabetes Sister   . Heart attack Brother   . Coronary artery disease Brother   . Vascular Disease Brother   . Hypertension Sister   . Heart attack Brother     Social History   Socioeconomic History  . Marital status: Widowed    Spouse name: Not on file  . Number of children: Not on  file  . Years of education: Not on file  . Highest education level: Not on file  Occupational History  . Not on file  Tobacco Use  . Smoking status: Former Smoker    Packs/day: 1.00    Years: 37.00    Pack years: 37.00    Types: Cigarettes    Quit date: 02/06/2010    Years since quitting: 9.6  . Smokeless tobacco: Former Systems developer    Types: Snuff  Substance and Sexual Activity  . Alcohol use: Yes    Alcohol/week: 5.0 standard drinks    Types: 5 Cans of beer per week    Comment: /h x of alcohol abuse -stopped 2012- now drinks 5 beer per w  . Drug use: Not Currently  Types: Marijuana, "Crack" cocaine, Cocaine    Comment: hx of cocaine use- last use 2015; last use sat marijuana6/22/19,   . Sexual activity: Yes  Other Topics Concern  . Not on file  Social History Narrative  . Not on file   Social Determinants of Health   Financial Resource Strain:   . Difficulty of Paying Living Expenses: Not on file  Food Insecurity:   . Worried About Charity fundraiser in the Last Year: Not on file  . Ran Out of Food in the Last Year: Not on file  Transportation Needs:   . Lack of Transportation (Medical): Not on file  . Lack of Transportation (Non-Medical): Not on file  Physical Activity:   . Days of Exercise per Week: Not on file  . Minutes of Exercise per Session: Not on file  Stress:   . Feeling of Stress : Not on file  Social Connections:   . Frequency of Communication with Friends and Family: Not on file  . Frequency of Social Gatherings with Friends and Family: Not on file  . Attends Religious Services: Not on file  . Active Member of Clubs or Organizations: Not on file  . Attends Archivist Meetings: Not on file  . Marital Status: Not on file  Intimate Partner Violence:   . Fear of Current or Ex-Partner: Not on file  . Emotionally Abused: Not on file  . Physically Abused: Not on file  . Sexually Abused: Not on file      Review of Systems  Constitutional:  Negative for chills, fatigue and unexpected weight change.  HENT: Negative for congestion, postnasal drip, rhinorrhea, sneezing and sore throat.   Respiratory: Positive for cough and wheezing. Negative for chest tightness and shortness of breath.        Intermittent.   Cardiovascular: Negative for chest pain and palpitations.  Gastrointestinal: Negative for abdominal pain, constipation, diarrhea, nausea and vomiting.  Endocrine: Negative for cold intolerance, heat intolerance, polydipsia and polyuria.       Blood sugars doing well   Musculoskeletal: Positive for arthralgias. Negative for back pain, joint swelling and neck pain.       Tenderness of the left wrist, especially when twisting the wrist in certain directions. No trauma or recent injuries noted.   Skin: Negative for rash.  Allergic/Immunologic: Positive for environmental allergies.  Neurological: Negative for dizziness, tremors, numbness and headaches.  Hematological: Negative for adenopathy. Does not bruise/bleed easily.  Psychiatric/Behavioral: Negative for behavioral problems (Depression), sleep disturbance and suicidal ideas. The patient is not nervous/anxious.     Today's Vitals   09/22/19 1140  BP: 126/69  Pulse: (!) 103  Resp: 16  Temp: (!) 97.4 F (36.3 C)  SpO2: 96%  Weight: 122 lb 6.4 oz (55.5 kg)  Height: 4\' 11"  (1.499 m)   Body mass index is 24.72 kg/m.  Physical Exam Vitals and nursing note reviewed.  Constitutional:      General: She is not in acute distress.    Appearance: Normal appearance. She is well-developed. She is not diaphoretic.  HENT:     Head: Normocephalic and atraumatic.     Nose: Nose normal.     Mouth/Throat:     Pharynx: No oropharyngeal exudate.  Eyes:     Conjunctiva/sclera: Conjunctivae normal.     Pupils: Pupils are equal, round, and reactive to light.  Neck:     Thyroid: No thyromegaly.     Vascular: No carotid bruit or JVD.  Trachea: No tracheal deviation.   Cardiovascular:     Rate and Rhythm: Normal rate and regular rhythm.     Heart sounds: Normal heart sounds. No murmur. No friction rub. No gallop.   Pulmonary:     Effort: Pulmonary effort is normal. No respiratory distress.     Breath sounds: Normal breath sounds. No wheezing or rales.  Chest:     Chest wall: No tenderness.  Abdominal:     Palpations: Abdomen is soft.  Musculoskeletal:        General: Tenderness present. Normal range of motion.     Cervical back: Normal range of motion and neck supple.     Comments: Mild tenderness along the left radial wrist, at the base of the left thumb. No palpable abnormalities or deformities noted. ROM and strength are currently intact.   Lymphadenopathy:     Cervical: No cervical adenopathy.  Skin:    General: Skin is warm and dry.  Neurological:     Mental Status: She is alert and oriented to person, place, and time.     Cranial Nerves: No cranial nerve deficit.  Psychiatric:        Mood and Affect: Mood normal.        Behavior: Behavior normal.        Thought Content: Thought content normal.        Judgment: Judgment normal.    Assessment/Plan: 1. Type 2 diabetes mellitus with hyperglycemia, without long-term current use of insulin (HCC) - POCT HgB A1C 6.5 today. Continue diabetic medication as prescribed.   2. Chronic obstructive pulmonary disease, unspecified COPD type ([place]) Stable. Continue inhalers and respiratory medication as prescribed   3. Pain in left wrist Apply diclofenac gel up to four times daily as needed for pain. Use wrist brace as needed to provide extra support.  - diclofenac Sodium (VOLTAREN) 1 % GEL; Apply 4 g topically 4 (four) times daily.  Dispense: 100 g; Refill: 2  4. Cancer of lower lobe of right lung [facility]) Regular visits with oncology as scheduled.   General Counseling: Saara verbalizes understanding of the findings of todays visit and agrees with plan of treatment. I have discussed any further  diagnostic evaluation that may be needed or ordered today. We also reviewed her medications today. she has been encouraged to call the office with any questions or concerns that should arise related to todays visit.  Diabetes Counseling:  1. Addition of ACE inh/ ARB'S for nephroprotection. Microalbumin is updated  2. Diabetic foot care, prevention of complications. Podiatry consult 3. Exercise and lose weight.  4. Diabetic eye examination, Diabetic eye exam is updated  5. Monitor blood sugar closlely. nutrition counseling.  6. Sign and symptoms of hypoglycemia including shaking sweating,confusion and headaches.  This patient was seen by Leretha Pol FNP Collaboration with Dr [name] as a part of collaborative care agreement  Orders Placed This Encounter  Procedures  . POCT HgB A1C    Meds ordered this encounter  Medications  . diclofenac Sodium (VOLTAREN) 1 % GEL    Sig: Apply 4 g topically 4 (four) times daily.    Dispense:  100 g    Refill:  2    Order Specific Question:   Supervising Provider    Answer:   [name] [[id number]]    Total time spent: 30 Minutes   Time spent includes review of chart, medications, test results, and follow up plan with the patient.  Dr [name] Internal medicine

## 2019-09-24 DIAGNOSIS — M25532 Pain in left wrist: Secondary | ICD-10-CM | POA: Insufficient documentation

## 2019-09-26 IMAGING — DX DG CHEST 1V PORT
1 series · 1 of 1 positions shown · non-contrast
Comparison: CT from earlier the same day, and previous studies

CLINICAL DATA: S/p biopsy

EXAM:
PORTABLE CHEST - 1 VIEW

[chest ap]
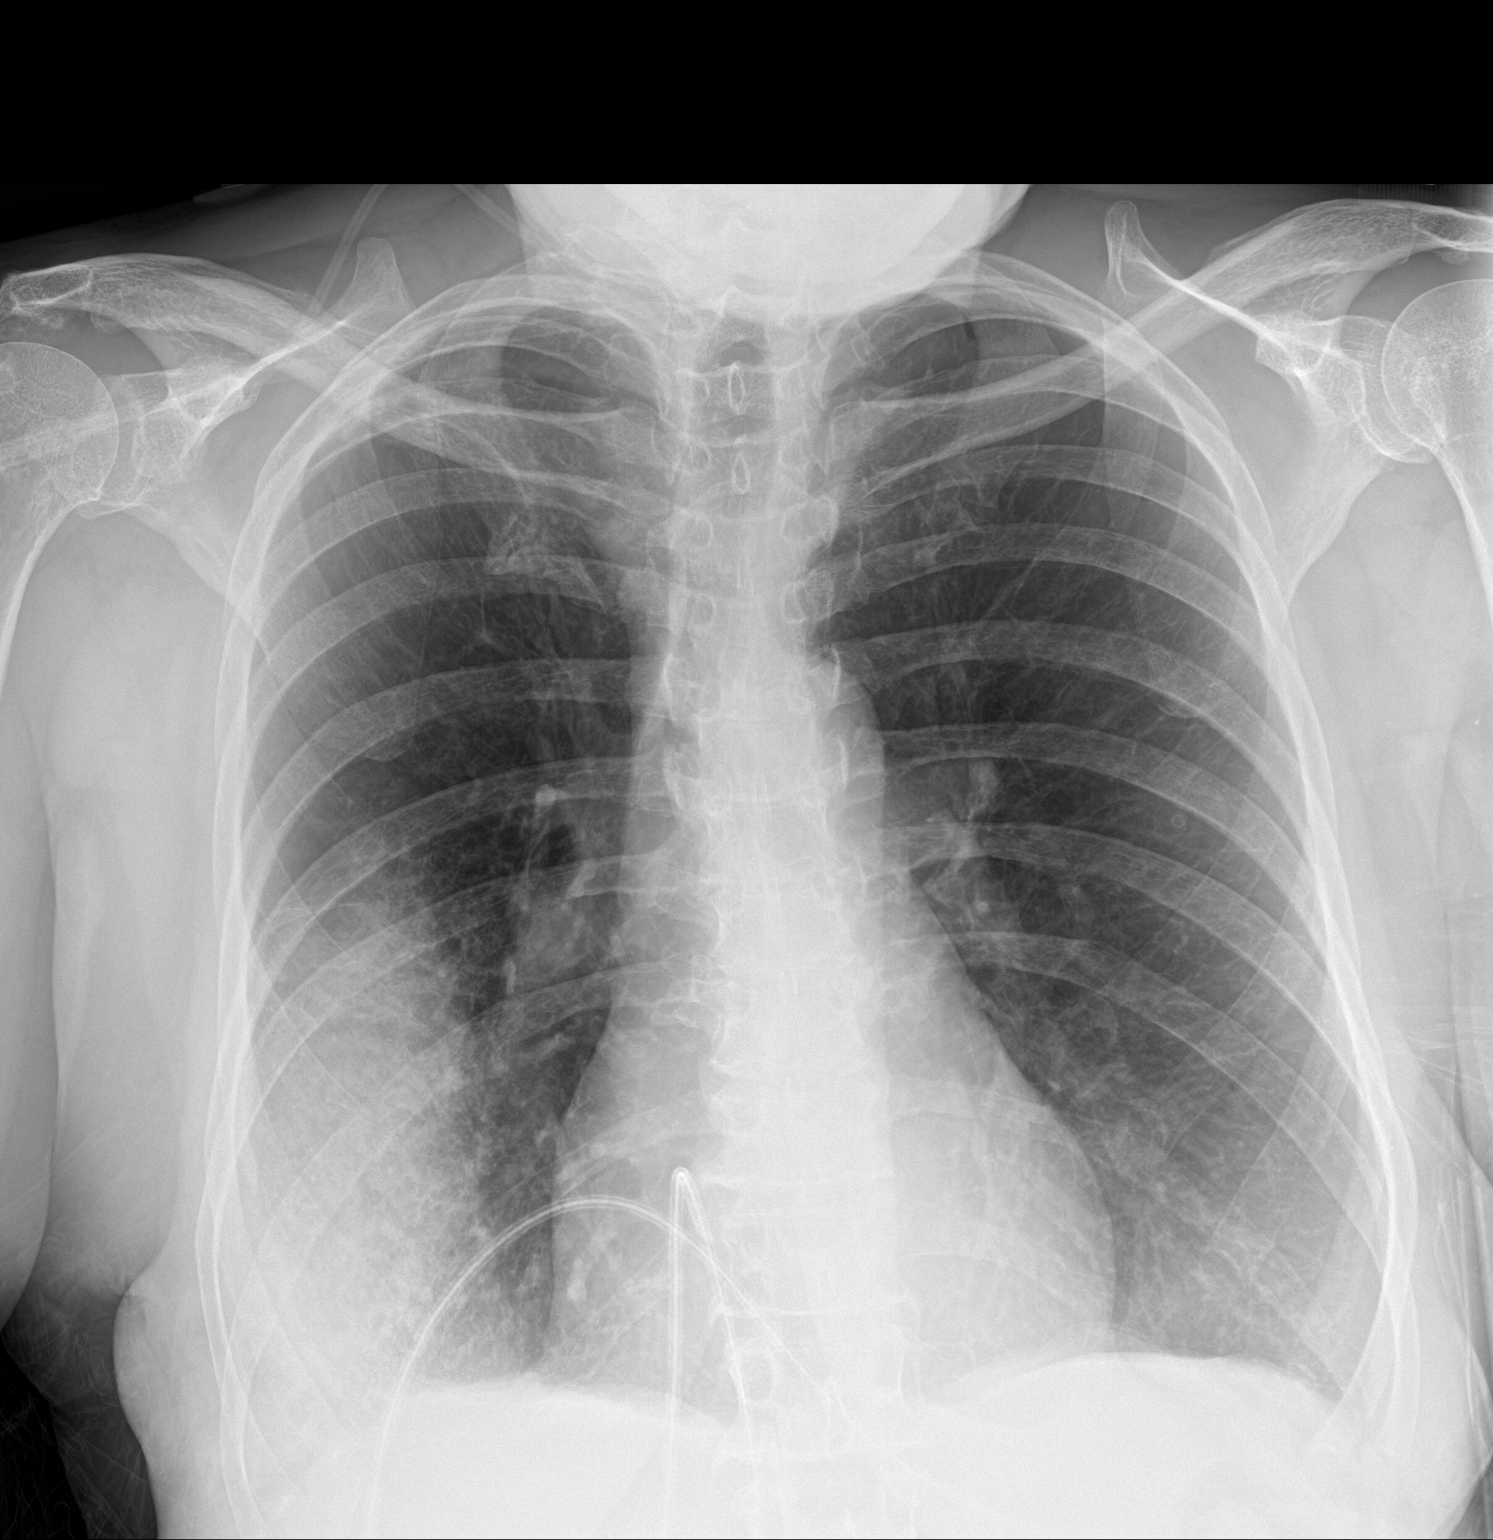

[1 of 1 positions shown; findings below may reference images not displayed]

FINDINGS: Airspace opacity laterally at the right lung base consistent with
regional alveolar hemorrhage from recent biopsy. No pneumothorax.
Left lung clear.

Heart size and mediastinal contours are within normal limits.

No definite effusion.

Visualized bones unremarkable.
IMPRESSION: 1. No pneumothorax post lung biopsy.
2. Right lower lobe regional alveolar hemorrhage.

## 2019-09-28 IMAGING — CR DG CHEST 2V
2 series · 2 of 2 positions shown · non-contrast
Comparison: 02/09/2018 and 07/31/2016

CLINICAL DATA: Hemoptysis after right lung biopsy.

EXAM:
CHEST - 2 VIEW

[chest pa]
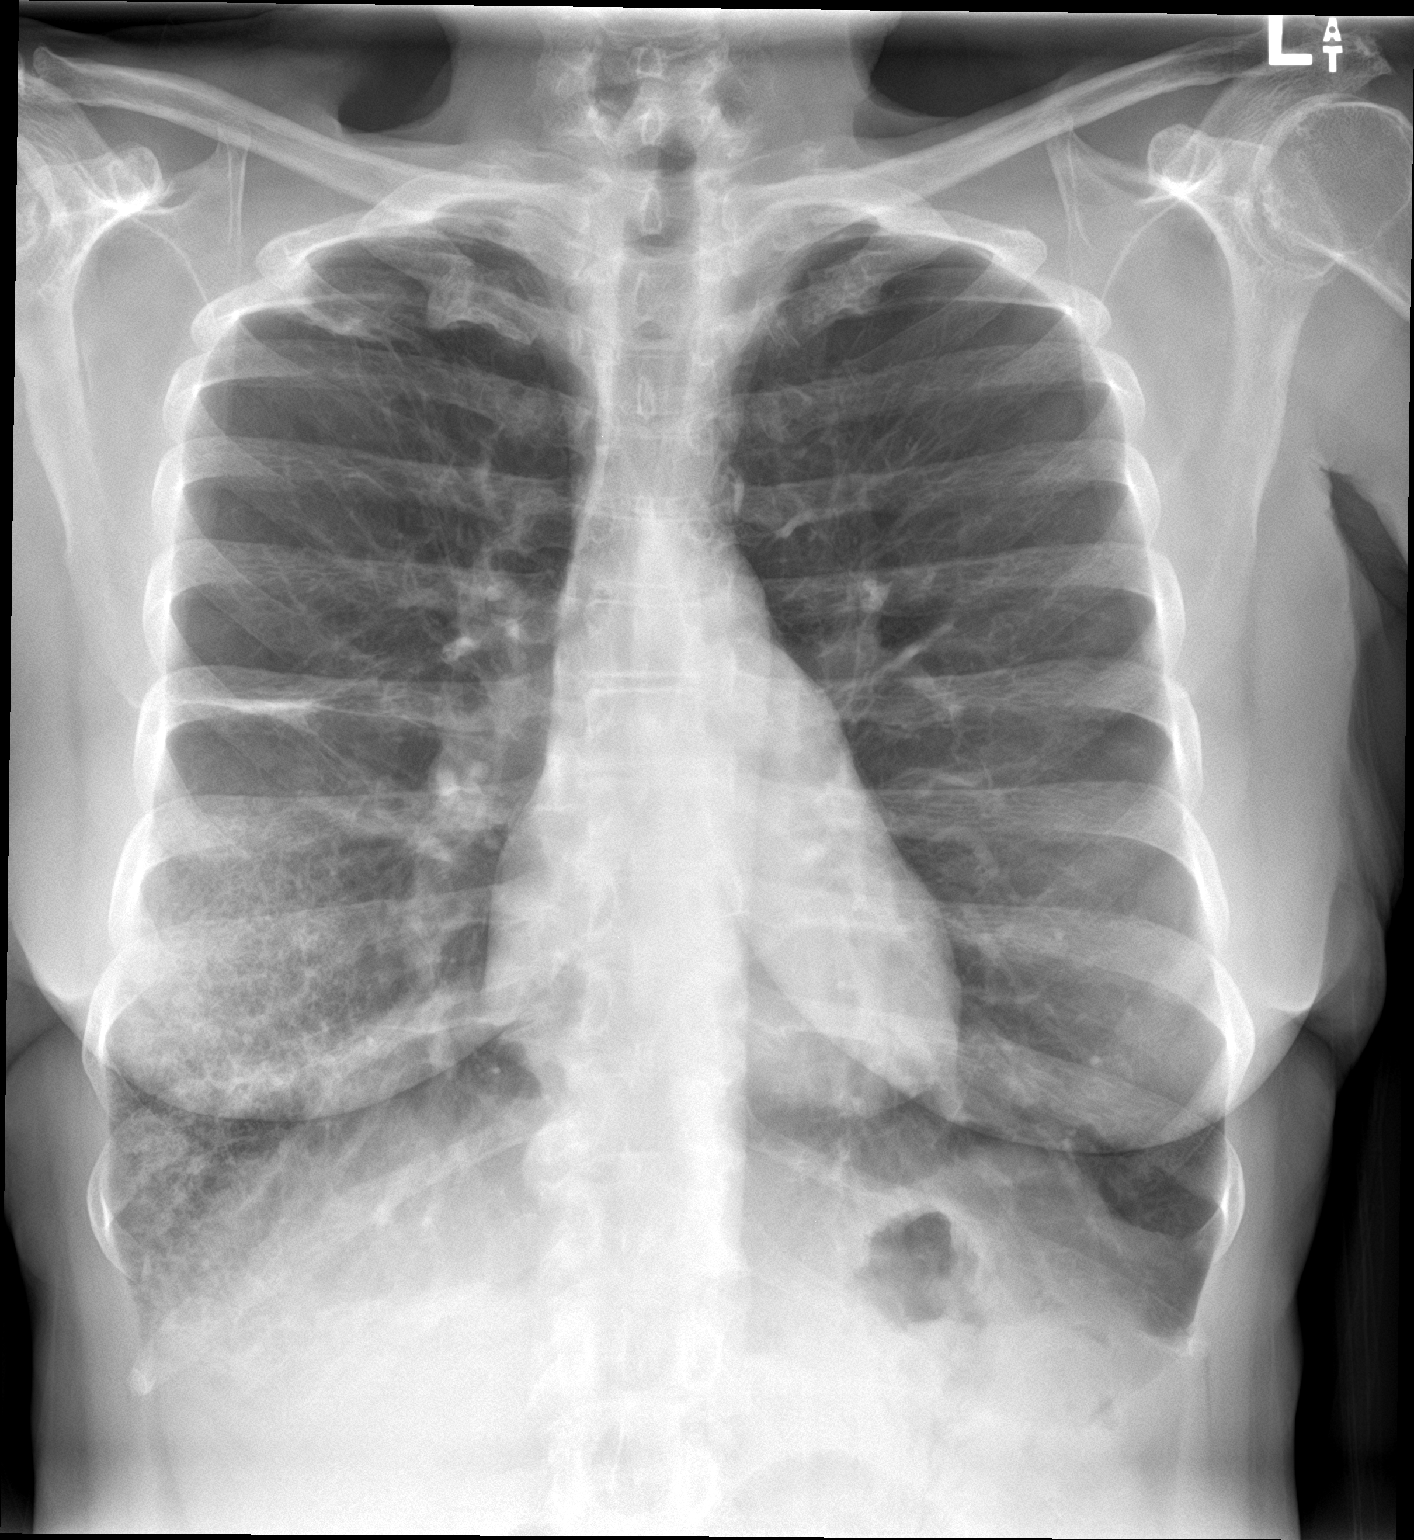

[chest lat]
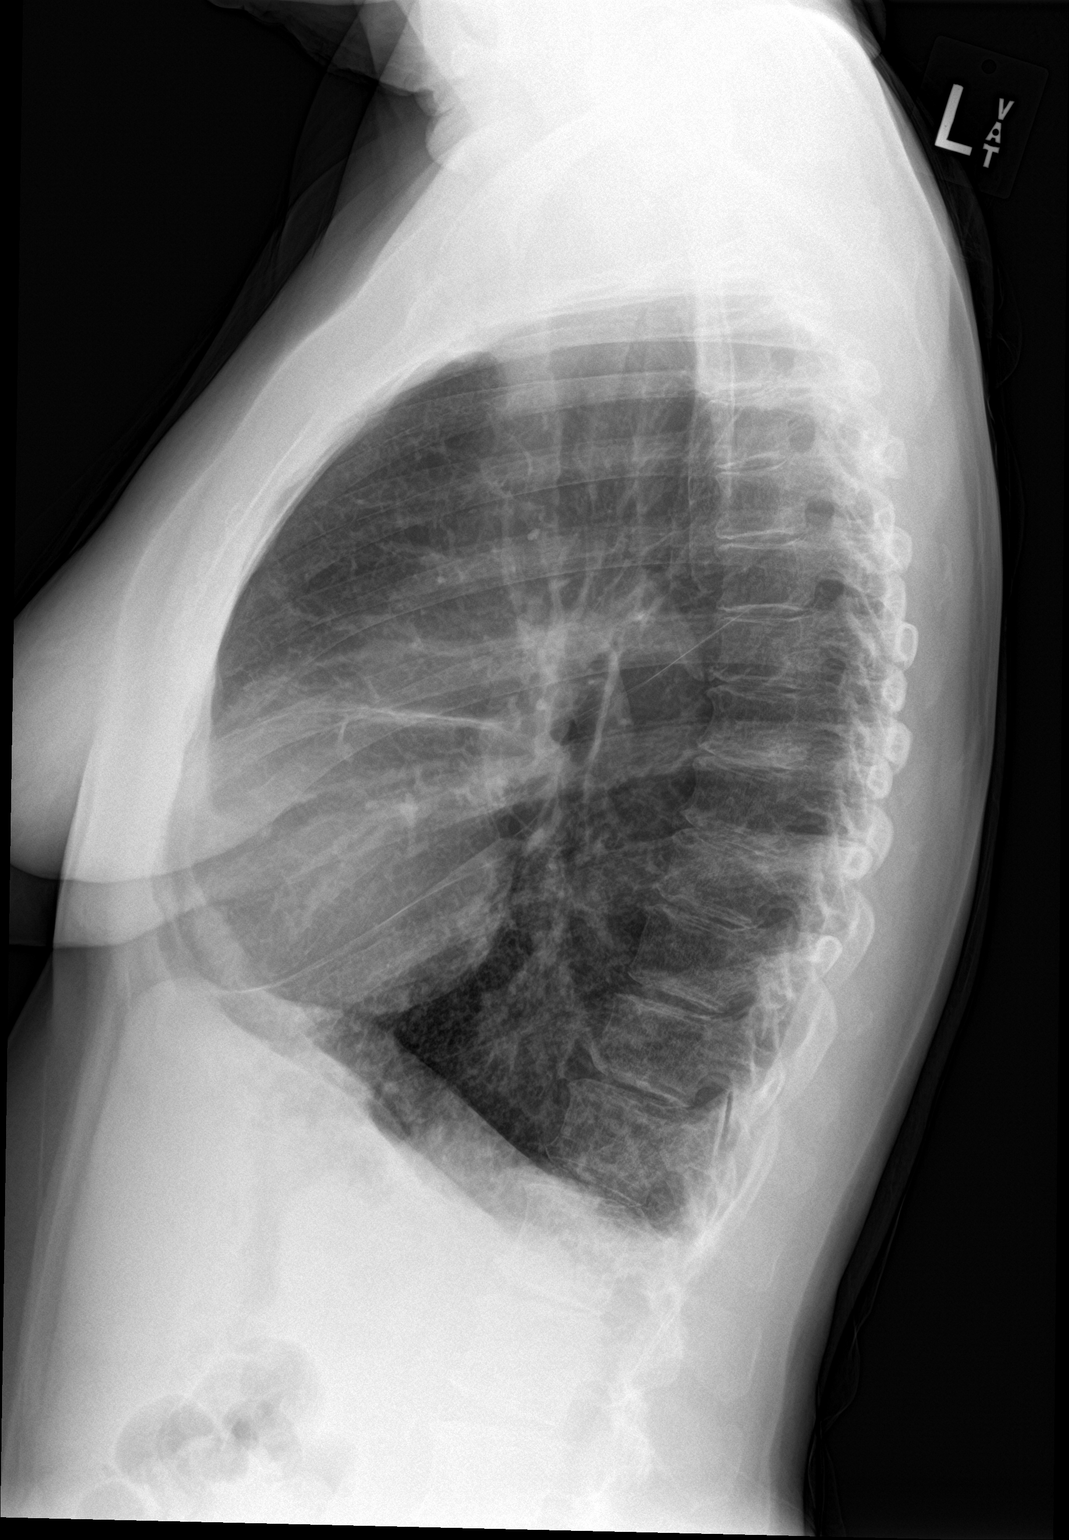

[2 of 2 positions shown; findings below may reference images not displayed]

FINDINGS: Interval significant partial clearing of the alveolar hemorrhage in
the right lower lobe. No pneumothorax. Chronic thickening of the
minor fissure on the right.

Left lung is clear.  Heart size and vascularity are normal.

No effusions.  No bone abnormality.
IMPRESSION: Significant partial clearing of the alveolar hemorrhage in the right
lower lobe. Some hemorrhage remains.

## 2019-10-02 ENCOUNTER — Other Ambulatory Visit: Payer: Self-pay

## 2019-10-02 MED ORDER — ALBUTEROL SULFATE (2.5 MG/3ML) 0.083% IN NEBU: 2.5000 mg | INHALATION_SOLUTION | Freq: Four times a day (QID) | RESPIRATORY_TRACT | 3 refills | Status: DC | PRN

## 2019-10-02 MED ORDER — VENTOLIN HFA 108 (90 BASE) MCG/ACT IN AERS: INHALATION_SPRAY | RESPIRATORY_TRACT | 0 refills | Status: DC

## 2019-10-05 ENCOUNTER — Other Ambulatory Visit: Payer: Self-pay

## 2019-10-05 MED ORDER — ALBUTEROL SULFATE (2.5 MG/3ML) 0.083% IN NEBU: 2.5000 mg | INHALATION_SOLUTION | Freq: Four times a day (QID) | RESPIRATORY_TRACT | 3 refills | Status: DC | PRN

## 2019-10-16 ENCOUNTER — Other Ambulatory Visit: Payer: Self-pay

## 2019-10-16 MED ORDER — ROPINIROLE HCL 0.5 MG PO TABS: 0.5000 mg | ORAL_TABLET | Freq: Two times a day (BID) | ORAL | 0 refills | Status: DC

## 2019-10-29 ENCOUNTER — Other Ambulatory Visit: Payer: Self-pay | Admitting: Adult Health

## 2019-10-29 DIAGNOSIS — J449 Chronic obstructive pulmonary disease, unspecified: Secondary | ICD-10-CM

## 2019-10-29 DIAGNOSIS — E1165 Type 2 diabetes mellitus with hyperglycemia: Secondary | ICD-10-CM

## 2019-10-30 ENCOUNTER — Other Ambulatory Visit: Payer: Self-pay

## 2019-10-30 MED ORDER — VENTOLIN HFA 108 (90 BASE) MCG/ACT IN AERS: INHALATION_SPRAY | RESPIRATORY_TRACT | 0 refills | Status: DC

## 2019-10-31 ENCOUNTER — Other Ambulatory Visit: Payer: Self-pay

## 2019-10-31 MED ORDER — VENTOLIN HFA 108 (90 BASE) MCG/ACT IN AERS: INHALATION_SPRAY | RESPIRATORY_TRACT | 3 refills | Status: DC

## 2019-11-03 ENCOUNTER — Ambulatory Visit (INDEPENDENT_AMBULATORY_CARE_PROVIDER_SITE_OTHER): Payer: BLUE CROSS/BLUE SHIELD

## 2019-11-03 ENCOUNTER — Encounter (INDEPENDENT_AMBULATORY_CARE_PROVIDER_SITE_OTHER): Payer: Self-pay | Admitting: Nurse Practitioner

## 2019-11-03 ENCOUNTER — Ambulatory Visit (INDEPENDENT_AMBULATORY_CARE_PROVIDER_SITE_OTHER): Payer: BLUE CROSS/BLUE SHIELD | Admitting: Nurse Practitioner

## 2019-11-03 ENCOUNTER — Other Ambulatory Visit: Payer: Self-pay

## 2019-11-03 VITALS — BP 111/72 | HR 92 | Resp 16 | Wt 119.6 lb

## 2019-11-03 DIAGNOSIS — I10 Essential (primary) hypertension: Secondary | ICD-10-CM

## 2019-11-03 DIAGNOSIS — I739 Peripheral vascular disease, unspecified: Secondary | ICD-10-CM | POA: Diagnosis not present

## 2019-11-03 DIAGNOSIS — E785 Hyperlipidemia, unspecified: Secondary | ICD-10-CM | POA: Diagnosis not present

## 2019-11-03 DIAGNOSIS — I70213 Atherosclerosis of native arteries of extremities with intermittent claudication, bilateral legs: Secondary | ICD-10-CM | POA: Diagnosis not present

## 2019-11-06 ENCOUNTER — Telehealth (INDEPENDENT_AMBULATORY_CARE_PROVIDER_SITE_OTHER): Payer: Self-pay

## 2019-11-06 NOTE — Telephone Encounter (Signed)
Spoke with the patient and she is now scheduled with Dr. Lucky Cowboy for a LLE angio on 11/13/19 with a 6:45 am arrival time to the [facility]. Patient will do covid testing on 11/09/19 between 8-1 pm at the [place]. Pre-procedure instructions were discussed and will be mailed to he patient.

## 2019-11-07 ENCOUNTER — Encounter (INDEPENDENT_AMBULATORY_CARE_PROVIDER_SITE_OTHER): Payer: Self-pay | Admitting: Nurse Practitioner

## 2019-11-07 NOTE — Progress Notes (Signed)
SUBJECTIVE:  [name], female    DOB: [birth year], 64 y.o.   MRN: [id number] Chief Complaint  Patient presents with  . Follow-up    20month ultrasound follow up    HPI  Angela Jensen is a 64 y.o. female The patient returns to the office for followup and review of the noninvasive studies. There has been a significant deterioration in the lower extremity symptoms.  The patient notes interval shortening of their claudication distance and development of mild rest pain symptoms. No new ulcers or wounds have occurred since the last visit.  There have been no significant changes to the patient's overall health care.  The patient denies amaurosis fugax or recent TIA symptoms. There are no recent neurological changes noted. The patient denies history of DVT, PE or superficial thrombophlebitis. The patient denies recent episodes of angina or shortness of breath.   ABI's Rt=1.08 and Lt=0.73  Duplex US of the lower extremity arterial system shows triphasic waveforms in the right common femoral artery with monophasic waveforms starting at the mid SFA extending throughout the right lower extremity. The left lower extremity has monophasic waveforms at the common and deep femoral artery however it occludes in the SFA from the mid to distal sections. The patient does have some collateralization providing dampened monophasic flow to the calf vessels. Past Medical History:  Diagnosis Date  . Arthritis   . Asthma   . Atherosclerosis of native arteries of extremity with intermittent claudication ([place]) 05/26/2016  . Cancer [facility]) 2012   Right Lung CA  . COPD (chronic obstructive pulmonary disease) ([place])   . Depression   . Diabetes mellitus without complication [facility])    Patient takes Janumet  . Essential hypertension 05/26/2016  . Hypercholesteremia   . Hypertension   . 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 PAD (peripheral artery disease) ([place]) 06/22/2016  . Peripheral vascular disease ([place])    . Personal history of radiation therapy   . Shortness of breath dyspnea    with exertion   . Sleep apnea     Past Surgical History:  Procedure Laterality Date  . CESAREAN SECTION     x3  . COLONOSCOPY WITH PROPOFOL N/A 06/25/2015   Procedure: COLONOSCOPY WITH PROPOFOL;  Surgeon: Lucilla Lame, MD;  Location: [facility] ENDOSCOPY;  Service: Endoscopy;  Laterality: N/A;  . CYST REMOVAL LEG     and on shoulder   . LOWER EXTREMITY ANGIOGRAPHY Left 09/29/2018   Procedure: LOWER EXTREMITY ANGIOGRAPHY;  Surgeon: Algernon Huxley, MD;  Location: [place] CV [facility];  Service: Cardiovascular;  Laterality: Left;  . LUNG BIOPSY  05 15 2013   has lung "spots"  . PERIPHERAL VASCULAR CATHETERIZATION Left 06/01/2016   Procedure: Lower Extremity Angiography;  Surgeon: Algernon Huxley, MD;  Location: [place] [facility];  Service: Cardiovascular;  Laterality: Left;  . PERIPHERAL VASCULAR CATHETERIZATION N/A 06/01/2016   Procedure: Abdominal Aortogram w/Lower Extremity;  Surgeon: Algernon Huxley, MD;  Location: [place] [facility];  Service: Cardiovascular;  Laterality: N/A;  . PERIPHERAL VASCULAR CATHETERIZATION  06/01/2016   Procedure: Lower Extremity Intervention;  Surgeon: Algernon Huxley, MD;  Location: [place] [facility];  Service: Cardiovascular;;  . PERIPHERAL VASCULAR CATHETERIZATION Right 06/08/2016   Procedure: Lower Extremity Angiography;  Surgeon: Algernon Huxley, MD;  Location: [place] [facility];  Service: Cardiovascular;  Laterality: Right;  . PERIPHERAL VASCULAR CATHETERIZATION  06/08/2016   Procedure: Lower Extremity Intervention;  Surgeon:  Algernon Huxley, MD;  Location: [place] [facility];  Service: Cardiovascular;;    Social History   Socioeconomic History  . Marital status: Widowed    Spouse name: Not on file  . Number of children: Not on file  . Years of education: Not on file  . Highest education level: Not on file  Occupational History  . Not on file  Tobacco Use  . Smoking status: Former  Smoker    Packs/day: 1.00    Years: 37.00    Pack years: 37.00    Types: Cigarettes    Quit date: 02/06/2010    Years since quitting: 9.7  . Smokeless tobacco: Former Systems developer    Types: Snuff  Substance and Sexual Activity  . Alcohol use: Yes    Alcohol/week: 5.0 standard drinks    Types: 5 Cans of beer per week    Comment: /h x of alcohol abuse -stopped 2012- now drinks 5 beer per w  . Drug use: Not Currently    Types: Marijuana, "Crack" cocaine, Cocaine    Comment: hx of cocaine use- last use 2015; last use sat marijuana6/22/19,   . Sexual activity: Yes  Other Topics Concern  . Not on file  Social History Narrative  . Not on file   Social Determinants of Health   Financial Resource Strain:   . Difficulty of Paying Living Expenses:   Food Insecurity:   . Worried About Charity fundraiser in the Last Year:   . Arboriculturist in the Last Year:   Transportation Needs:   . Film/video editor (Medical):   Marland Kitchen Lack of Transportation (Non-Medical):   Physical Activity:   . Days of Exercise per Week:   . Minutes of Exercise per Session:   Stress:   . Feeling of Stress :   Social Connections:   . Frequency of Communication with Friends and Family:   . Frequency of Social Gatherings with Friends and Family:   . Attends Religious Services:   . Active Member of Clubs or Organizations:   . Attends Archivist Meetings:   Marland Kitchen Marital Status:   Intimate Partner Violence:   . Fear of Current or Ex-Partner:   . Emotionally Abused:   Marland Kitchen Physically Abused:   . Sexually Abused:     Family History  Problem Relation Age of Onset  . Lung cancer Father   . Diabetes Mother   . Hypercholesterolemia Mother   . Diabetes Sister   . Diabetes Maternal Grandmother   . Diabetes Paternal Grandmother   . Diabetes Sister   . Heart attack Brother   . Coronary artery disease Brother   . Vascular Disease Brother   . Hypertension Sister   . Heart attack Brother     No Known  Allergies   Review of Systems   Review of Systems: Negative Unless Checked Constitutional: [] Weight loss  [] Fever  [] Chills Cardiac: [] Chest pain   []  Atrial Fibrillation  [] Palpitations   [] Shortness of breath when laying flat   [] Shortness of breath with exertion. [] Shortness of breath at rest Vascular:  [x] Pain in legs with walking   [] Pain in legs with standing [] Pain in legs when laying flat   [x] Claudication    [x] Pain in feet when laying flat    [] History of DVT   [] Phlebitis   [] Swelling in legs   [] Varicose veins   [] Non-healing ulcers Pulmonary:   [] Uses home oxygen   [] Productive cough   [] Hemoptysis   []   Wheeze  [] COPD   [] Asthma Neurologic:  [] Dizziness   [] Seizures  [] Blackouts [] History of stroke   [] History of TIA  [] Aphasia   [] Temporary Blindness   [] Weakness or numbness in arm   [] Weakness or numbness in leg Musculoskeletal:   [] Joint swelling   [] Joint pain   [] Low back pain  []  History of Knee Replacement [] Arthritis [] back Surgeries  []  Spinal Stenosis    Hematologic:  [] Easy bruising  [] Easy bleeding   [] Hypercoagulable state   [] Anemic Gastrointestinal:  [] Diarrhea   [] Vomiting  [] Gastroesophageal reflux/heartburn   [] Difficulty swallowing. [] Abdominal pain Genitourinary:  [] Chronic kidney disease   [] Difficult urination  [] Anuric   [] Blood in urine [] Frequent urination  [] Burning with urination   [] Hematuria Skin:  [] Rashes   [] Ulcers [] Wounds Psychological:  [] History of anxiety   []  History of major depression  []  Memory Difficulties      OBJECTIVE:   Physical Exam  BP 111/72 (BP Location: Right Arm)   Pulse 92   Resp 16   Wt 119 lb 9.6 oz (54.3 kg)   BMI 24.16 kg/m   Gen: WD/WN, NAD Head: [place]/AT, No temporalis wasting.  Ear/Nose/Throat: Hearing grossly intact, nares w/o erythema or drainage Eyes: PER, EOMI, sclera nonicteric.  Neck: Supple, no masses.  No JVD.  Pulmonary:  Good air movement, no use of accessory muscles.  Cardiac: RRR Vascular:  Vessel  Right Left  Radial Palpable Palpable  Dorsalis Pedis Not Palpable Not Palpable  Posterior Tibial Not Palpable Not Palpable   Gastrointestinal: soft, non-distended. No guarding/no peritoneal signs.  Musculoskeletal: M/S 5/5 throughout.  No deformity or atrophy.  Neurologic: Pain and light touch intact in extremities.  Symmetrical.  Speech is fluent. Motor exam as listed above. Psychiatric: Judgment intact, Mood & affect appropriate for pt's clinical situation. Dermatologic: No Venous rashes. No Ulcers Noted.  No changes consistent with cellulitis. Lymph : No Cervical lymphadenopathy, no lichenification or skin changes of chronic lymphedema.       ASSESSMENT AND PLAN:  1. Atherosclerosis of native artery of both lower extremities with intermittent claudication (HCC) Recommend:  The patient has evidence of severe atherosclerotic changes of both lower extremities with rest pain that is associated with preulcerative changes and impending tissue loss of the foot.  This represents a limb threatening ischemia and places the patient at the risk for limb loss.  Patient should undergo angiography of the lower extremities with the hope for intervention for limb salvage.  The risks and benefits as well as the alternative therapies was discussed in detail with the patient.  All questions were answered.  Patient agrees to proceed with angiography.  The patient will follow up with me in the office after the procedure.       2. Essential hypertension Continue antihypertensive medications as already ordered, these medications have been reviewed and there are no changes at this time.   3. Hyperlipidemia, unspecified hyperlipidemia type Continue statin as ordered and reviewed, no changes at this time    Current Outpatient Medications on File Prior to Visit  Medication Sig Dispense Refill  . Accu-Chek FastClix Lancets MISC Use as directed twice a day diag E11.65 100 each 1  . albuterol (PROVENTIL)  (2.5 MG/3ML) 0.083% nebulizer solution Take 3 mLs (2.5 mg total) by nebulization every 6 (six) hours as needed for wheezing. 75 mL 3  . amLODipine (NORVASC) 10 MG tablet Take 1 tablet (10 mg total) by mouth every morning. 30 tablet 5  . aspirin EC 81 MG  tablet Take 1 tablet (81 mg total) by mouth daily. 150 tablet 2  . buPROPion (WELLBUTRIN XL) 150 MG 24 hr tablet TAKE 1 TABLET BY MOUTH TWICE DAILY FOR SMOKING CESSATION 60 tablet 6  . clopidogrel (PLAVIX) 75 MG tablet Take 1 tablet by mouth once daily 90 tablet 0  . diclofenac Sodium (VOLTAREN) 1 % GEL Apply 4 g topically 4 (four) times daily. 100 g 2  . gabapentin (NEURONTIN) 100 MG capsule Take 1 capsule by mouth twice daily 60 capsule 0  . glucose blood (ACCU-CHEK AVIVA PLUS) test strip Use as instructed to check blood sugars three times daily.  E11.65 250 each 5  . JANUVIA 25 MG tablet Take 1 tablet by mouth once daily 90 tablet 0  . lisinopril-hydrochlorothiazide (ZESTORETIC) 10-12.5 MG tablet Take 1 tablet by mouth every morning. 30 tablet 5  . meloxicam (MOBIC) 7.5 MG tablet Take 1 tablet (7.5 mg total) by mouth 2 (two) times daily. 60 tablet 3  . metFORMIN (GLUCOPHAGE-XR) 500 MG 24 hr tablet Take 1 tablet (500 mg total) by mouth daily with breakfast. 30 tablet 3  . mometasone-formoterol (DULERA) 200-5 MCG/ACT AERO Inhale 1 puff into the lungs 2 (two) times daily. Please fill as 90 day prescription 3 Inhaler 3  . OXYGEN Inhale 2 L into the lungs at bedtime.    . potassium chloride (K-DUR) 10 MEQ tablet TAKE 1 TABLET BY MOUTH EVERY OTHER DAY FOR  LOW  POTASSIUM 45 tablet 3  . rOPINIRole (REQUIP) 0.5 MG tablet Take 1 tablet (0.5 mg total) by mouth 2 (two) times daily. 180 tablet 0  . rosuvastatin (CRESTOR) 10 MG tablet Take 1 tablet (10 mg total) by mouth at bedtime. 30 tablet 5  . SPIRIVA HANDIHALER 18 MCG inhalation capsule INHALE THE CONTENTS OF ONE CAPSULE BY MOUTH ONCE DAILY 90 capsule 0  . VENTOLIN HFA 108 (90 Base) MCG/ACT inhaler  INHALE 2 PUFFS INTO LUNGS EVERY 4 HOURS AS NEEDED FOR WHEEZING OR SHORTNESS OF BREATH 18 g 3  . ALPRAZolam (XANAX) 0.25 MG tablet May take 1/2 to 1 tablet po one time one to two hours prior to flight. May repeat dose in one hour as needed for persistent anxiety. (Patient not taking: Reported on 11/03/2019) 10 tablet 0   No current facility-administered medications on file prior to visit.    There are no Patient Instructions on file for this visit. No follow-ups on file.   Kris Hartmann, NP  This note was completed with Sales executive.  Any errors are purely unintentional.

## 2019-11-09 ENCOUNTER — Telehealth (INDEPENDENT_AMBULATORY_CARE_PROVIDER_SITE_OTHER): Payer: Self-pay

## 2019-11-09 ENCOUNTER — Other Ambulatory Visit: Admission: RE | Admit: 2019-11-09 | Payer: BLUE CROSS/BLUE SHIELD | Source: Ambulatory Visit

## 2019-11-12 ENCOUNTER — Other Ambulatory Visit (INDEPENDENT_AMBULATORY_CARE_PROVIDER_SITE_OTHER): Payer: Self-pay | Admitting: Nurse Practitioner

## 2019-11-13 ENCOUNTER — Ambulatory Visit
Admission: RE | Admit: 2019-11-13 | Payer: BLUE CROSS/BLUE SHIELD | Source: Home / Self Care | Admitting: Vascular Surgery

## 2019-11-13 ENCOUNTER — Encounter: Admission: RE | Payer: Self-pay | Source: Home / Self Care

## 2019-11-13 SURGERY — LOWER EXTREMITY ANGIOGRAPHY
Anesthesia: Moderate Sedation | Site: Leg Lower | Laterality: Left

## 2019-12-25 ENCOUNTER — Other Ambulatory Visit: Payer: Self-pay

## 2019-12-25 ENCOUNTER — Encounter: Payer: BLUE CROSS/BLUE SHIELD | Admitting: Nurse Practitioner

## 2019-12-25 ENCOUNTER — Other Ambulatory Visit (INDEPENDENT_AMBULATORY_CARE_PROVIDER_SITE_OTHER): Payer: Self-pay | Admitting: Nurse Practitioner

## 2019-12-25 DIAGNOSIS — I739 Peripheral vascular disease, unspecified: Secondary | ICD-10-CM

## 2019-12-25 MED ORDER — AMLODIPINE BESYLATE 10 MG PO TABS: 10.0000 mg | ORAL_TABLET | Freq: Every morning | ORAL | 5 refills | Status: DC

## 2020-01-01 ENCOUNTER — Other Ambulatory Visit: Payer: Self-pay

## 2020-01-01 ENCOUNTER — Ambulatory Visit (INDEPENDENT_AMBULATORY_CARE_PROVIDER_SITE_OTHER): Payer: BLUE CROSS/BLUE SHIELD | Admitting: Nurse Practitioner

## 2020-01-01 ENCOUNTER — Encounter: Payer: Self-pay | Admitting: Nurse Practitioner

## 2020-01-01 VITALS — BP 143/79 | HR 106 | Temp 97.0°F | Resp 16 | Ht 59.0 in | Wt 116.0 lb

## 2020-01-01 DIAGNOSIS — J0140 Acute pansinusitis, unspecified: Secondary | ICD-10-CM

## 2020-01-01 DIAGNOSIS — E1165 Type 2 diabetes mellitus with hyperglycemia: Secondary | ICD-10-CM | POA: Diagnosis not present

## 2020-01-01 DIAGNOSIS — Z0001 Encounter for general adult medical examination with abnormal findings: Secondary | ICD-10-CM

## 2020-01-01 DIAGNOSIS — R300 Dysuria: Secondary | ICD-10-CM

## 2020-01-01 DIAGNOSIS — I70213 Atherosclerosis of native arteries of extremities with intermittent claudication, bilateral legs: Secondary | ICD-10-CM

## 2020-01-01 DIAGNOSIS — I739 Peripheral vascular disease, unspecified: Secondary | ICD-10-CM

## 2020-01-01 DIAGNOSIS — I10 Essential (primary) hypertension: Secondary | ICD-10-CM

## 2020-01-01 LAB — POCT GLYCOSYLATED HEMOGLOBIN (HGB A1C): Hemoglobin A1C: 6.2 % — AB (ref 4.0–5.6)

## 2020-01-01 MED ORDER — AMOXICILLIN 875 MG PO TABS: 875.0000 mg | ORAL_TABLET | Freq: Two times a day (BID) | ORAL | 0 refills | Status: DC

## 2020-01-01 NOTE — Progress Notes (Signed)
[facility] [place], [place] [address]  Internal MEDICINE  Office Visit Note  Patient Name: Angela Jensen  [id number]  [id number]  Date of Service: 01/01/2020   Pt is here for routine health maintenance examination   Chief Complaint  Patient presents with  . Annual Exam  . Hypertension  . Hyperlipidemia  . Diabetes  . Depression     The patient is here for health maintenance exam. She is diabetic. She has good blood sugar control. HgbA1c is 6.2 today. Blood pressure is well managed. She is due to have routine, fasting labs. She had mammogram was done 06/2019 and was negative. She has had both Pfizer COVID 19 vaccines. Had no problems.  She does have some sinus and nasal congestion today. Has been intermittent for last few weeks. Blowing her nose often. Does feel some congestion in her chest as well.  The patient hsa history of significant PAD with atherosclerosis of both lower extremities. Has been seeing [facility] for some time. She was supposed to have femoral stent plact on left side, however, the are no longer participating with [employer]. Patient needs to have referral to provider who will accept her insurance.   Current Medication: Outpatient Encounter Medications as of 01/01/2020  Medication Sig  . Accu-Chek FastClix Lancets MISC Use as directed twice a day diag E11.65  . albuterol (PROVENTIL) (2.5 MG/3ML) 0.083% nebulizer solution Take 3 mLs (2.5 mg total) by nebulization every 6 (six) hours as needed for wheezing.  Marland Kitchen ALPRAZolam (XANAX) 0.25 MG tablet May take 1/2 to 1 tablet po one time one to two hours prior to flight. May repeat dose in one hour as needed for persistent anxiety. (Patient not taking: Reported on 11/03/2019)  . amLODipine (NORVASC) 10 MG tablet Take 1 tablet (10 mg total) by mouth every morning.  Marland Kitchen amoxicillin (AMOXIL) 875 MG tablet Take 1 tablet (875 mg total) by mouth 2 (two) times daily.  Marland Kitchen  aspirin EC 81 MG tablet Take 1 tablet (81 mg total) by mouth daily.  Marland Kitchen buPROPion (WELLBUTRIN XL) 150 MG 24 hr tablet TAKE 1 TABLET BY MOUTH TWICE DAILY FOR SMOKING CESSATION  . clopidogrel (PLAVIX) 75 MG tablet Take 1 tablet by mouth once daily  . diclofenac Sodium (VOLTAREN) 1 % GEL Apply 4 g topically 4 (four) times daily.  Marland Kitchen gabapentin (NEURONTIN) 100 MG capsule Take 1 capsule by mouth twice daily  . glucose blood (ACCU-CHEK AVIVA PLUS) test strip Use as instructed to check blood sugars three times daily.  E11.65  . JANUVIA 25 MG tablet Take 1 tablet by mouth once daily  . lisinopril-hydrochlorothiazide (ZESTORETIC) 10-12.5 MG tablet Take 1 tablet by mouth every morning.  . meloxicam (MOBIC) 7.5 MG tablet Take 1 tablet (7.5 mg total) by mouth 2 (two) times daily.  . metFORMIN (GLUCOPHAGE-XR) 500 MG 24 hr tablet Take 1 tablet (500 mg total) by mouth daily with breakfast.  . mometasone-formoterol (DULERA) 200-5 MCG/ACT AERO Inhale 1 puff into the lungs 2 (two) times daily. Please fill as 90 day prescription  . OXYGEN Inhale 2 L into the lungs at bedtime.  . potassium chloride (K-DUR) 10 MEQ tablet TAKE 1 TABLET BY MOUTH EVERY OTHER DAY FOR  LOW  POTASSIUM  . rOPINIRole (REQUIP) 0.5 MG tablet Take 1 tablet (0.5 mg total) by mouth 2 (two) times daily.  . rosuvastatin (CRESTOR) 10 MG tablet Take 1 tablet (10 mg total) by mouth at bedtime.  Marland Kitchen  SPIRIVA HANDIHALER 18 MCG inhalation capsule INHALE THE CONTENTS OF ONE CAPSULE BY MOUTH ONCE DAILY  . VENTOLIN HFA 108 (90 Base) MCG/ACT inhaler INHALE 2 PUFFS INTO LUNGS EVERY 4 HOURS AS NEEDED FOR WHEEZING OR SHORTNESS OF BREATH   No facility-administered encounter medications on file as of 01/01/2020.    Surgical History: Past Surgical History:  Procedure Laterality Date  . CESAREAN SECTION     x3  . COLONOSCOPY WITH PROPOFOL N/A 06/25/2015   Procedure: COLONOSCOPY WITH PROPOFOL;  Surgeon: Lucilla Lame, MD;  Location: [facility] ENDOSCOPY;  Service:  Endoscopy;  Laterality: N/A;  . CYST REMOVAL LEG     and on shoulder   . LOWER EXTREMITY ANGIOGRAPHY Left 09/29/2018   Procedure: LOWER EXTREMITY ANGIOGRAPHY;  Surgeon: Algernon Huxley, MD;  Location: [place] [facility];  Service: Cardiovascular;  Laterality: Left;  . LUNG BIOPSY  05 15 2013   has lung "spots"  . PERIPHERAL VASCULAR CATHETERIZATION Left 06/01/2016   Procedure: Lower Extremity Angiography;  Surgeon: Algernon Huxley, MD;  Location: [place] [facility];  Service: Cardiovascular;  Laterality: Left;  . PERIPHERAL VASCULAR CATHETERIZATION N/A 06/01/2016   Procedure: Abdominal Aortogram w/Lower Extremity;  Surgeon: Algernon Huxley, MD;  Location: [place] [facility];  Service: Cardiovascular;  Laterality: N/A;  . PERIPHERAL VASCULAR CATHETERIZATION  06/01/2016   Procedure: Lower Extremity Intervention;  Surgeon: Algernon Huxley, MD;  Location: [place] [facility];  Service: Cardiovascular;;  . PERIPHERAL VASCULAR CATHETERIZATION Right 06/08/2016   Procedure: Lower Extremity Angiography;  Surgeon: Algernon Huxley, MD;  Location: [place] [facility];  Service: Cardiovascular;  Laterality: Right;  . PERIPHERAL VASCULAR CATHETERIZATION  06/08/2016   Procedure: Lower Extremity Intervention;  Surgeon: Algernon Huxley, MD;  Location: [place] [facility];  Service: Cardiovascular;;    Medical History: Past Medical History:  Diagnosis Date  . Arthritis   . Asthma   . Atherosclerosis of native arteries of extremity with intermittent claudication ([place]) 05/26/2016  . Cancer [facility]) 2012   Right Lung CA  . COPD (chronic obstructive pulmonary disease) ([place])   . Depression   . Diabetes mellitus without complication [facility])    Patient takes Janumet  . Essential hypertension 05/26/2016  . Hypercholesteremia   . Hypertension   . 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 PAD (peripheral artery disease) ([place]) 06/22/2016  . Peripheral vascular disease ([place])   . Personal history of radiation therapy   . Shortness of  breath dyspnea    with exertion   . Sleep apnea     Family History: Family History  Problem Relation Age of Onset  . Lung cancer Father   . Diabetes Mother   . Hypercholesterolemia Mother   . Diabetes Sister   . Diabetes Maternal Grandmother   . Diabetes Paternal Grandmother   . Diabetes Sister   . Heart attack Brother   . Coronary artery disease Brother   . Vascular Disease Brother   . Hypertension Sister   . Heart attack Brother       Review of Systems  Constitutional: Negative for chills, fatigue and unexpected weight change.  HENT: Positive for congestion, postnasal drip, rhinorrhea and sinus pressure. Negative for sneezing and sore throat.   Respiratory: Positive for cough and wheezing. Negative for chest tightness and shortness of breath.        Intermittent.   Cardiovascular: Negative for chest pain and palpitations.  Gastrointestinal: Negative for abdominal pain, constipation, diarrhea, nausea  and vomiting.  Endocrine: Negative for cold intolerance, heat intolerance, polydipsia and polyuria.       Blood sugars doing well   Genitourinary: Negative for dysuria, frequency and urgency.  Musculoskeletal: Positive for arthralgias. Negative for back pain, joint swelling and neck pain.  Skin: Negative for rash.  Allergic/Immunologic: Positive for environmental allergies.  Neurological: Negative for dizziness, tremors, numbness and headaches.  Hematological: Negative for adenopathy. Does not bruise/bleed easily.  Psychiatric/Behavioral: Negative for behavioral problems (Depression), sleep disturbance and suicidal ideas. The patient is not nervous/anxious.    Today's Vitals   01/01/20 1354  BP: (!) 143/79  Pulse: (!) 106  Resp: 16  Temp: (!) 97 F (36.1 C)  SpO2: 93%  Weight: 116 lb (52.6 kg)  Height: 4\' 11"  (1.499 m)   Body mass index is 23.43 kg/m.  Physical Exam Vitals and nursing note reviewed.  Constitutional:      General: She is not in acute distress.     Appearance: Normal appearance. She is well-developed. She is not diaphoretic.  HENT:     Head: Normocephalic and atraumatic.     Nose: Congestion present.     Mouth/Throat:     Pharynx: No oropharyngeal exudate.  Eyes:     Conjunctiva/sclera: Conjunctivae normal.     Pupils: Pupils are equal, round, and reactive to light.  Neck:     Thyroid: No thyromegaly.     Vascular: No carotid bruit or JVD.     Trachea: No tracheal deviation.  Cardiovascular:     Rate and Rhythm: Normal rate and regular rhythm.     Heart sounds: Normal heart sounds. No murmur. No friction rub. No gallop.      Comments: Pulses mildly diminished, but palpable, bilaterally.  Pulmonary:     Effort: Pulmonary effort is normal. No respiratory distress.     Breath sounds: Wheezing and rhonchi present. No rales.  Chest:     Chest wall: No tenderness.     Breasts:        Right: Normal. No swelling, bleeding, inverted nipple, mass, nipple discharge, skin change or tenderness.        Left: Normal. No swelling, bleeding, inverted nipple, mass, nipple discharge, skin change or tenderness.  Abdominal:     General: Bowel sounds are normal.     Palpations: Abdomen is soft.     Tenderness: There is no abdominal tenderness.  Musculoskeletal:        General: Normal range of motion.     Cervical back: Normal range of motion and neck supple.  Lymphadenopathy:     Cervical: No cervical adenopathy.     Upper Body:     Right upper body: No axillary adenopathy.     Left upper body: No axillary adenopathy.  Skin:    General: Skin is warm and dry.  Neurological:     General: No focal deficit present.     Mental Status: She is alert and oriented to person, place, and time.     Cranial Nerves: No cranial nerve deficit.  Psychiatric:        Mood and Affect: Mood normal.        Behavior: Behavior normal.        Thought Content: Thought content normal.        Judgment: Judgment normal.      LABS: Recent Results (from the  past 2160 hour(s))  POCT HgB A1C     Status: Abnormal   Collection Time: 01/01/20  2:38 PM  Result Value Ref Range   Hemoglobin A1C 6.2 (A) 4.0 - 5.6 %   HbA1c POC (<> result, manual entry)     HbA1c, POC (prediabetic range)     HbA1c, POC (controlled diabetic range)      Assessment/Plan: 1. Encounter for general adult medical examination with abnormal findings Annual health maintenance exam today. Lab slip given to check routine, fasting labs.   2. Acute non-recurrent pansinusitis Start zmoxicilline 875mg  bid for 10 days. Continue to use OTC medication as needed and as indicated to improve acute symptoms.  - amoxicillin (AMOXIL) 875 MG tablet; Take 1 tablet (875 mg total) by mouth 2 (two) times daily.  Dispense: 20 tablet; Refill: 0  3. Type 2 diabetes mellitus with hyperglycemia, without long-term current use of insulin (HCC) - POCT HgB A1C 6.2 today. Continue diabetic medication as prescribed   4. Atherosclerosis of native artery of both lower extremities with intermittent claudication ([place]) Refer to vascular surgery. Patient prefers to see provider at [facility]  - Ambulatory referral to Vascular Surgery  5. PAD (peripheral artery disease) ([place]) Refer to vascular surgery. Patient prefers to see provider at [facility]  - Ambulatory referral to Vascular Surgery  6. Essential hypertension Stable. Continue bp medication as prescribed   7. Dysuria - Urinalysis, Routine w reflex microscopic   General Counseling: Sama verbalizes understanding of the findings of todays visit and agrees with plan of treatment. I have discussed any further diagnostic evaluation that may be needed or ordered today. We also reviewed her medications today. she has been encouraged to call the office with any questions or concerns that should arise related to todays visit.    Counseling:  This patient was seen by Leretha Pol FNP Collaboration with Dr [name] as a part of collaborative care agreement  Orders  Placed This Encounter  Procedures  . Urinalysis, Routine w reflex microscopic  . Ambulatory referral to Vascular Surgery  . POCT HgB A1C    Meds ordered this encounter  Medications  . amoxicillin (AMOXIL) 875 MG tablet    Sig: Take 1 tablet (875 mg total) by mouth 2 (two) times daily.    Dispense:  20 tablet    Refill:  0    Order Specific Question:   Supervising Provider    Answer:   [name] [[id number]]    Total time spent: 46 Minutes  Time spent includes review of chart, medications, test results, and follow up plan with the patient.     [name], MD  Internal Medicine

## 2020-01-02 LAB — URINALYSIS, ROUTINE W REFLEX MICROSCOPIC
Bilirubin, UA: NEGATIVE
Glucose, UA: NEGATIVE
Ketones, UA: NEGATIVE
Leukocytes,UA: NEGATIVE
Nitrite, UA: NEGATIVE
RBC, UA: NEGATIVE
Specific Gravity, UA: 1.022 (ref 1.005–1.030)
Urobilinogen, Ur: 1 mg/dL (ref 0.2–1.0)
pH, UA: 6 (ref 5.0–7.5)

## 2020-01-17 ENCOUNTER — Other Ambulatory Visit: Payer: Self-pay

## 2020-01-17 ENCOUNTER — Other Ambulatory Visit: Payer: Self-pay | Admitting: Internal Medicine

## 2020-01-17 DIAGNOSIS — J449 Chronic obstructive pulmonary disease, unspecified: Secondary | ICD-10-CM

## 2020-01-17 MED ORDER — ROSUVASTATIN CALCIUM 10 MG PO TABS: 10.0000 mg | ORAL_TABLET | Freq: Every day | ORAL | 5 refills | Status: DC

## 2020-01-17 MED ORDER — DULERA 200-5 MCG/ACT IN AERO: 1.0000 | INHALATION_SPRAY | Freq: Two times a day (BID) | RESPIRATORY_TRACT | 1 refills | Status: DC

## 2020-01-29 ENCOUNTER — Other Ambulatory Visit: Payer: Self-pay

## 2020-01-29 DIAGNOSIS — E1165 Type 2 diabetes mellitus with hyperglycemia: Secondary | ICD-10-CM

## 2020-01-29 MED ORDER — METFORMIN HCL ER 500 MG PO TB24: 500.0000 mg | ORAL_TABLET | Freq: Every day | ORAL | 3 refills | Status: DC

## 2020-02-17 ENCOUNTER — Other Ambulatory Visit: Payer: Self-pay | Admitting: Internal Medicine

## 2020-02-17 DIAGNOSIS — J449 Chronic obstructive pulmonary disease, unspecified: Secondary | ICD-10-CM

## 2020-02-23 ENCOUNTER — Other Ambulatory Visit: Payer: Self-pay | Admitting: Internal Medicine

## 2020-02-23 DIAGNOSIS — E1165 Type 2 diabetes mellitus with hyperglycemia: Secondary | ICD-10-CM

## 2020-02-26 ENCOUNTER — Other Ambulatory Visit: Payer: Self-pay | Admitting: Internal Medicine

## 2020-02-26 DIAGNOSIS — E1165 Type 2 diabetes mellitus with hyperglycemia: Secondary | ICD-10-CM

## 2020-03-04 ENCOUNTER — Ambulatory Visit
Admission: RE | Admit: 2020-03-04 | Discharge: 2020-03-04 | Disposition: A | Discharge status: Home / Self Care | Payer: BLUE CROSS/BLUE SHIELD | Source: Ambulatory Visit | Attending: Hematology and Oncology | Admitting: Hematology and Oncology

## 2020-03-04 ENCOUNTER — Other Ambulatory Visit: Payer: Self-pay

## 2020-03-04 DIAGNOSIS — C3431 Malignant neoplasm of lower lobe, right bronchus or lung: Secondary | ICD-10-CM

## 2020-03-04 DIAGNOSIS — C3411 Malignant neoplasm of upper lobe, right bronchus or lung: Secondary | ICD-10-CM | POA: Diagnosis not present

## 2020-03-06 ENCOUNTER — Encounter: Payer: Self-pay | Admitting: Radiation Oncology

## 2020-03-06 ENCOUNTER — Other Ambulatory Visit: Payer: Self-pay | Admitting: Licensed Clinical Social Worker

## 2020-03-06 ENCOUNTER — Ambulatory Visit
Admission: RE | Admit: 2020-03-06 | Discharge: 2020-03-06 | Disposition: A | Discharge status: Home / Self Care | Payer: BLUE CROSS/BLUE SHIELD | Source: Ambulatory Visit | Attending: Radiation Oncology | Admitting: Radiation Oncology

## 2020-03-06 ENCOUNTER — Other Ambulatory Visit: Payer: Self-pay

## 2020-03-06 ENCOUNTER — Telehealth: Payer: Self-pay

## 2020-03-06 VITALS — BP 174/96 | HR 102 | Temp 98.2°F | Wt 109.0 lb

## 2020-03-06 DIAGNOSIS — Z87891 Personal history of nicotine dependence: Secondary | ICD-10-CM | POA: Insufficient documentation

## 2020-03-06 DIAGNOSIS — Z923 Personal history of irradiation: Secondary | ICD-10-CM | POA: Insufficient documentation

## 2020-03-06 DIAGNOSIS — R918 Other nonspecific abnormal finding of lung field: Secondary | ICD-10-CM | POA: Insufficient documentation

## 2020-03-06 DIAGNOSIS — C3431 Malignant neoplasm of lower lobe, right bronchus or lung: Secondary | ICD-10-CM

## 2020-03-06 NOTE — Telephone Encounter (Signed)
Confirmed appointment on 03/07/2020 and screened foe covid. klh

## 2020-03-06 NOTE — Progress Notes (Signed)
Radiation Oncology Follow up Note  Name: Angela Jensen   Date:   03/06/2020 MRN:  [id number] DOB: 25-Nov-1955    This 64 y.o. 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  REFERRING PROVIDER: Ronnell Freshwater, NP  HPI: Patient is a 64 year old female now out close to 2 years having completed SBRT to her right lower lobe for stage I adenocarcinoma.  We have been following some small pulmonary nodules which on repeat CT scan.  Shows pulmonary nodules enlarging and new ones presenting suspicious for metastatic disease or metachronous primary.  Patient is asymptomatic specifically denies cough hemoptysis or chest tightness.  COMPLICATIONS OF TREATMENT: none  FOLLOW UP COMPLIANCE: keeps appointments   PHYSICAL EXAM:  BP (!) 174/96 (BP Location: Left Arm, Patient Position: Sitting, Cuff Size: Normal)    Pulse (!) 102    Temp 98.2 F (36.8 C) (Tympanic)    Wt 109 lb (49.4 kg)    SpO2 97%    BMI 22.02 kg/m  Well-developed well-nourished patient in NAD. HEENT reveals PERLA, EOMI, discs not visualized.  Oral cavity is clear. No oral mucosal lesions are identified. Neck is clear without evidence of cervical or supraclavicular adenopathy. Lungs are clear to A&P. Cardiac examination is essentially unremarkable with regular rate and rhythm without murmur rub or thrill. Abdomen is benign with no organomegaly or masses noted. Motor sensory and DTR levels are equal and symmetric in the upper and lower extremities. Cranial nerves II through XII are grossly intact. Proprioception is intact. No peripheral adenopathy or edema is identified. No motor or sensory levels are noted. Crude visual fields are within normal range.  RADIOLOGY RESULTS: CT scans reviewed compatible with above-stated findings  PLAN: Present time of ordered a PET CT scan she will be seeing Dr. Mike Gip in the next 2 weeks and will make further recommendations  at that time.  Possibility of biopsy may be entertained.  I have otherwise asked to see her back in 3 months for follow-up.  Patient understands the recommendations and results of her scan.  Patient already has follow-up appointment scheduled with Dr. Mike Gip.  I would like to take this opportunity to thank you for allowing me to participate in the care of your patient.Noreene Filbert, MD

## 2020-03-07 ENCOUNTER — Ambulatory Visit: Payer: BLUE CROSS/BLUE SHIELD | Admitting: Internal Medicine

## 2020-03-08 ENCOUNTER — Telehealth: Payer: Self-pay

## 2020-03-08 NOTE — Telephone Encounter (Signed)
Confirmed appointment on 03/11/2020 and screened for covid. klh ° °

## 2020-03-11 ENCOUNTER — Encounter: Payer: Self-pay | Admitting: Internal Medicine

## 2020-03-11 ENCOUNTER — Ambulatory Visit (INDEPENDENT_AMBULATORY_CARE_PROVIDER_SITE_OTHER): Payer: BLUE CROSS/BLUE SHIELD | Admitting: Internal Medicine

## 2020-03-11 ENCOUNTER — Other Ambulatory Visit: Payer: Self-pay

## 2020-03-11 VITALS — BP 182/104 | HR 95 | Temp 97.3°F | Resp 16 | Ht 59.0 in | Wt 110.0 lb

## 2020-03-11 DIAGNOSIS — R0602 Shortness of breath: Secondary | ICD-10-CM | POA: Diagnosis not present

## 2020-03-11 DIAGNOSIS — R918 Other nonspecific abnormal finding of lung field: Secondary | ICD-10-CM

## 2020-03-11 DIAGNOSIS — C3431 Malignant neoplasm of lower lobe, right bronchus or lung: Secondary | ICD-10-CM | POA: Diagnosis not present

## 2020-03-11 DIAGNOSIS — J9611 Chronic respiratory failure with hypoxia: Secondary | ICD-10-CM

## 2020-03-11 DIAGNOSIS — Z9981 Dependence on supplemental oxygen: Secondary | ICD-10-CM

## 2020-03-11 DIAGNOSIS — J449 Chronic obstructive pulmonary disease, unspecified: Secondary | ICD-10-CM

## 2020-03-11 DIAGNOSIS — I10 Essential (primary) hypertension: Secondary | ICD-10-CM

## 2020-03-11 NOTE — Progress Notes (Signed)
[facility] [place], [place] [address]  Pulmonary Sleep Medicine   Office Visit Note  Patient Name: Angela Jensen DOB: 09/02/1955 MRN [id number]  Date of Service: 03/11/2020  Complaints/HPI: Pt is here for pulm follow up.  She is currently treated for copd using inhalers and nebulizer.  She denies any recent issues at this time.  She has not been in the hospital recently.  She denies any falls or other significant events.  She continues to wear oxygen at night 2 lpm for hypoxia. She has a history of lung cancer, and has been seeing oncology.  She had a follow up CT this month, and has a new lung nodule suspicious for mets.  She has an appt to see oncology and is getting a pet scan.  Her blood pressure is elevated today, she has not taken any of her medications.   ROS  General: (-) fever, (-) chills, (-) night sweats, (-) weakness Skin: (-) rashes, (-) itching,. Eyes: (-) visual changes, (-) redness, (-) itching. Nose and Sinuses: (-) nasal stuffiness or itchiness, (-) postnasal drip, (-) nosebleeds, (-) sinus trouble. Mouth and Throat: (-) sore throat, (-) hoarseness. Neck: (-) swollen glands, (-) enlarged thyroid, (-) neck pain. Respiratory: - cough, (-) bloody sputum, - shortness of breath, - wheezing. Cardiovascular: - ankle swelling, (-) chest pain. Lymphatic: (-) lymph node enlargement. Neurologic: (-) numbness, (-) tingling. Psychiatric: (-) anxiety, (-) depression   Current Medication: Outpatient Encounter Medications as of 03/11/2020  Medication Sig  . Accu-Chek FastClix Lancets MISC Use as directed twice a day diag E11.65  . albuterol (PROVENTIL) (2.5 MG/3ML) 0.083% nebulizer solution Take 3 mLs (2.5 mg total) by nebulization every 6 (six) hours as needed for wheezing.  Marland Kitchen amLODipine (NORVASC) 10 MG tablet Take 1 tablet (10 mg total) by mouth every morning.  Marland Kitchen aspirin EC 81 MG tablet Take 1 tablet (81 mg total) by mouth daily.  Marland Kitchen buPROPion  (WELLBUTRIN XL) 150 MG 24 hr tablet TAKE 1 TABLET BY MOUTH TWICE DAILY FOR SMOKING CESSATION  . clopidogrel (PLAVIX) 75 MG tablet Take 1 tablet by mouth once daily  . gabapentin (NEURONTIN) 100 MG capsule Take 1 capsule by mouth twice daily  . glucose blood (ACCU-CHEK AVIVA PLUS) test strip Use as instructed to check blood sugars three times daily.  E11.65  . JANUVIA 25 MG tablet Take 1 tablet by mouth once daily  . lisinopril-hydrochlorothiazide (ZESTORETIC) 10-12.5 MG tablet Take 1 tablet by mouth every morning.  . meloxicam (MOBIC) 7.5 MG tablet Take 1 tablet (7.5 mg total) by mouth 2 (two) times daily.  . metFORMIN (GLUCOPHAGE-XR) 500 MG 24 hr tablet Take 1 tablet (500 mg total) by mouth daily with breakfast.  . mometasone-formoterol (DULERA) 200-5 MCG/ACT AERO Inhale 1 puff into the lungs 2 (two) times daily. Please fill as 90 day prescription  . OXYGEN Inhale 2 L into the lungs at bedtime.  . potassium chloride (K-DUR) 10 MEQ tablet TAKE 1 TABLET BY MOUTH EVERY OTHER DAY FOR  LOW  POTASSIUM  . rOPINIRole (REQUIP) 0.5 MG tablet Take 1 tablet (0.5 mg total) by mouth 2 (two) times daily.  . rosuvastatin (CRESTOR) 10 MG tablet Take 1 tablet (10 mg total) by mouth at bedtime.  Marland Kitchen SPIRIVA HANDIHALER 18 MCG inhalation capsule INHALE THE CONTENTS OF ONE CAPSULE ONCE DAILY.  . VENTOLIN HFA 108 (90 Base) MCG/ACT inhaler INHALE 2 PUFFS INTO LUNGS EVERY 4 HOURS AS NEEDED FOR WHEEZING OR SHORTNESS OF BREATH  .  ALPRAZolam (XANAX) 0.25 MG tablet May take 1/2 to 1 tablet po one time one to two hours prior to flight. May repeat dose in one hour as needed for persistent anxiety. (Patient not taking: Reported on 03/11/2020)  . amoxicillin (AMOXIL) 875 MG tablet Take 1 tablet (875 mg total) by mouth 2 (two) times daily. (Patient not taking: Reported on 03/11/2020)  . diclofenac Sodium (VOLTAREN) 1 % GEL Apply 4 g topically 4 (four) times daily. (Patient not taking: Reported on 03/11/2020)   No facility-administered  encounter medications on file as of 03/11/2020.    Surgical History: Past Surgical History:  Procedure Laterality Date  . CESAREAN SECTION     x3  . COLONOSCOPY WITH PROPOFOL N/A 06/25/2015   Procedure: COLONOSCOPY WITH PROPOFOL;  Surgeon: Lucilla Lame, MD;  Location: [facility] ENDOSCOPY;  Service: Endoscopy;  Laterality: N/A;  . CYST REMOVAL LEG     and on shoulder   . LOWER EXTREMITY ANGIOGRAPHY Left 09/29/2018   Procedure: LOWER EXTREMITY ANGIOGRAPHY;  Surgeon: Algernon Huxley, MD;  Location: [place] [facility];  Service: Cardiovascular;  Laterality: Left;  . LUNG BIOPSY  05 15 2013   has lung "spots"  . PERIPHERAL VASCULAR CATHETERIZATION Left 06/01/2016   Procedure: Lower Extremity Angiography;  Surgeon: Algernon Huxley, MD;  Location: [place] [facility];  Service: Cardiovascular;  Laterality: Left;  . PERIPHERAL VASCULAR CATHETERIZATION N/A 06/01/2016   Procedure: Abdominal Aortogram w/Lower Extremity;  Surgeon: Algernon Huxley, MD;  Location: [place] [facility];  Service: Cardiovascular;  Laterality: N/A;  . PERIPHERAL VASCULAR CATHETERIZATION  06/01/2016   Procedure: Lower Extremity Intervention;  Surgeon: Algernon Huxley, MD;  Location: [place] [facility];  Service: Cardiovascular;;  . PERIPHERAL VASCULAR CATHETERIZATION Right 06/08/2016   Procedure: Lower Extremity Angiography;  Surgeon: Algernon Huxley, MD;  Location: [place] [facility];  Service: Cardiovascular;  Laterality: Right;  . PERIPHERAL VASCULAR CATHETERIZATION  06/08/2016   Procedure: Lower Extremity Intervention;  Surgeon: Algernon Huxley, MD;  Location: [place] [facility];  Service: Cardiovascular;;    Medical History: Past Medical History:  Diagnosis Date  . Arthritis   . Asthma   . Atherosclerosis of native arteries of extremity with intermittent claudication ([place]) 05/26/2016  . Cancer [facility]) 2012   Right Lung CA  . COPD (chronic obstructive pulmonary disease) ([place])   . Depression   . Diabetes mellitus without complication  [facility])    Patient takes Janumet  . Essential hypertension 05/26/2016  . Hypercholesteremia   . Hypertension   . 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 PAD (peripheral artery disease) ([place]) 06/22/2016  . Peripheral vascular disease ([place])   . Personal history of radiation therapy   . Shortness of breath dyspnea    with exertion   . Sleep apnea     Family History: Family History  Problem Relation Age of Onset  . Lung cancer Father   . Diabetes Mother   . Hypercholesterolemia Mother   . Diabetes Sister   . Diabetes Maternal Grandmother   . Diabetes Paternal Grandmother   . Diabetes Sister   . Heart attack Brother   . Coronary artery disease Brother   . Vascular Disease Brother   . Hypertension Sister   . Heart attack Brother     Social History: Social History   Socioeconomic History  . Marital status: Widowed    Spouse name: Not on file  . Number of children: Not on file  .  Years of education: Not on file  . Highest education level: Not on file  Occupational History  . Not on file  Tobacco Use  . Smoking status: Former Smoker    Packs/day: 1.00    Years: 37.00    Pack years: 37.00    Types: Cigarettes    Quit date: 02/06/2010    Years since quitting: 10.0  . Smokeless tobacco: Former Systems developer    Types: Snuff  Vaping Use  . Vaping Use: Never used  Substance and Sexual Activity  . Alcohol use: Yes    Alcohol/week: 5.0 standard drinks    Types: 5 Cans of beer per week    Comment: /h x of alcohol abuse -stopped 2012- now drinks 5 beer per w  . Drug use: Not Currently    Types: Marijuana, "Crack" cocaine, Cocaine    Comment: hx of cocaine use- last use 2015; last use sat marijuana6/22/19,   . Sexual activity: Yes  Other Topics Concern  . Not on file  Social History Narrative  . Not on file   Social Determinants of Health   Financial Resource Strain:   . Difficulty of Paying Living Expenses:   Food Insecurity:   . Worried About Charity fundraiser in the Last  Year:   . Arboriculturist in the Last Year:   Transportation Needs:   . Film/video editor (Medical):   Marland Kitchen Lack of Transportation (Non-Medical):   Physical Activity:   . Days of Exercise per Week:   . Minutes of Exercise per Session:   Stress:   . Feeling of Stress :   Social Connections:   . Frequency of Communication with Friends and Family:   . Frequency of Social Gatherings with Friends and Family:   . Attends Religious Services:   . Active Member of Clubs or Organizations:   . Attends Archivist Meetings:   Marland Kitchen Marital Status:   Intimate Partner Violence:   . Fear of Current or Ex-Partner:   . Emotionally Abused:   Marland Kitchen Physically Abused:   . Sexually Abused:     Vital Signs: Blood pressure (!) 182/104, pulse 95, temperature (!) 97.3 F (36.3 C), resp. rate 16, height 4\' 11"  (1.499 m), weight 110 lb (49.9 kg), SpO2 96 %.  Examination: General Appearance: The patient is well-developed, well-nourished, and in no distress. Skin: Gross inspection of skin unremarkable. Head: normocephalic, no gross deformities. Eyes: no gross deformities noted. ENT: ears appear grossly normal no exudates. Neck: Supple. No thyromegaly. No LAD. Respiratory: clear bilaterally.. Cardiovascular: Normal S1 and S2 without murmur or rub. Extremities: No cyanosis. pulses are equal. Neurologic: Alert and oriented. No involuntary movements.  LABS: Recent Results (from the past 2160 hour(s))  Urinalysis, Routine w reflex microscopic     Status: None   Collection Time: 01/01/20  2:02 PM  Result Value Ref Range   Specific Gravity, UA 1.022 1.005 - 1.030   pH, UA 6.0 5.0 - 7.5   Color, UA Yellow Yellow   Appearance Ur Clear Clear   Leukocytes,UA Negative Negative   Protein,UA Trace Negative/Trace   Glucose, UA Negative Negative   Ketones, UA Negative Negative   RBC, UA Negative Negative   Bilirubin, UA Negative Negative   Urobilinogen, Ur 1.0 0.2 - 1.0 mg/dL   Nitrite, UA Negative  Negative   Microscopic Examination Comment     Comment: Microscopic not indicated and not performed.  POCT HgB A1C     Status: Abnormal  Collection Time: 01/01/20  2:38 PM  Result Value Ref Range   Hemoglobin A1C 6.2 (A) 4.0 - 5.6 %   HbA1c POC (<> result, manual entry)     HbA1c, POC (prediabetic range)     HbA1c, POC (controlled diabetic range)      Radiology: No results found.  No results found.  CT CHEST WO CONTRAST  Result Date: 03/04/2020 CLINICAL DATA:  Restaging RIGHT upper lobe lung cancer diagnosed in 2012 EXAM: CT CHEST WITHOUT CONTRAST TECHNIQUE: Multidetector CT imaging of the chest was performed following the standard protocol without IV contrast. COMPARISON:  07/19/2019 FINDINGS: Cardiovascular: Calcified atheromatous plaque in the thoracic aorta. No aneurysmal dilation. Heart size is normal without pericardial effusion. Calcified coronary artery disease. Central pulmonary vasculature is of normal caliber. Mediastinum/Nodes: No thoracic inlet or axillary lymphadenopathy. No hilar adenopathy. No mediastinal adenopathy. Esophagus is grossly normal. Lungs/Pleura: Pulmonary emphysema. Bandlike area in the RIGHT upper lobe and in the RIGHT middle lobe with similar appearance treated area in the RIGHT lower lobe along the major fissure and minor fissure are stable. Enlarging pulmonary nodules in the RIGHT lower lobe (image 95 of series 3 and 96 of series 3) measuring 8 mm, these 2 pulmonary nodules previously measured approximately 5 mm. A new small nodule has developed in the interim (image 96 of series 3 measuring approximately 5 mm with a third small nodule also present on image 102 of series 3 measuring 3 mm. Abdomen: No acute upper abdominal process. Musculoskeletal: No acute bone finding. No destructive bone process. Spinal degenerative changes. Subacute fracture of RIGHT sixth rib laterally with callus formation since the previous imaging evaluation. IMPRESSION: Enlarging and new  pulmonary nodules suspicious for metastatic disease or metachronous primary. Stable appearance of post treatment changes Aortic Atherosclerosis (ICD10-I70.0) and Emphysema (ICD10-J43.9). Electronically Signed   By: Zetta Bills M.D.   On: 03/04/2020 17:30      Assessment and Plan: Patient Active Problem List   Diagnosis Date Noted  . Acute non-recurrent pansinusitis 01/01/2020  . Pain in left wrist 09/24/2019  . Encounter for general adult medical examination with abnormal findings 06/28/2019  . Elevated total protein 01/22/2019  . Uncontrolled type 2 diabetes mellitus with hyperglycemia ([place]) 05/16/2018  . Chronic obstructive pulmonary disease ([place]) 05/16/2018  . Vitamin D deficiency 05/16/2018  . Flu vaccine need 05/16/2018  . Dysuria 05/16/2018  . Cancer of lower lobe of right lung ([place]) 01/21/2018  . Screening for breast cancer 10/12/2017  . Personal history of tobacco use, presenting hazards to health 02/03/2017  . PAD (peripheral artery disease) ([place]) 06/22/2016  . Essential hypertension 05/26/2016  . Diabetes ([place]) 05/26/2016  . Hyperlipidemia 05/26/2016  . Atherosclerosis of native arteries of extremity with intermittent claudication ([place]) 05/26/2016  . Uterine leiomyoma 04/30/2016  . Elevated CEA 01/31/2016  . Special screening for malignant neoplasms, colon   . Benign neoplasm of sigmoid colon   . Primary lung cancer ([place])   . Malignant neoplasm of upper lobe of right lung ([place])     1. Chronic obstructive pulmonary disease, unspecified COPD type ([place]) Stable, continue to use inhalers and nebulizer as prescribed.   2. Multiple nodules of lung New nodules on lung, follow with oncology.   3. Cancer of lower lobe of right lung ([place]) Follow up with oncology as scheduled for pet scan.  4. Chronic respiratory failure with hypoxia ([facility]) Stable continue with oxygen therapy.   5. Oxygen dependent Continue to use oxygen 2 lpm at night, and during  day as needed.   6.  Essential hypertension Go home and take meds.  If no improvement, call office.   7. SOB (shortness of breath) - Spirometry with Graph  General Counseling: I have discussed the findings of the evaluation and examination with [facility].  I have also discussed any further diagnostic evaluation thatmay be needed or ordered today. Angela Jensen verbalizes understanding of the findings of todays visit. We also reviewed her medications today and discussed drug interactions and side effects including but not limited excessive drowsiness and altered mental states. We also discussed that there is always a risk not just to her but also people around her. she has been encouraged to call the office with any questions or concerns that should arise related to todays visit.  Orders Placed This Encounter  Procedures  . Spirometry with Graph    Order Specific Question:   Where should this test be performed?    Answer:   [place]     Time spent: 30 This patient was seen by Orson Gear AGNP-C in Collaboration with Dr. Devona Konig as a part of collaborative care agreement.   I have personally obtained a history, examined the patient, evaluated laboratory and imaging results, formulated the assessment and plan and placed orders.    Allyne Gee, MD [facility] Pulmonary and Critical Care Sleep medicine

## 2020-03-12 ENCOUNTER — Other Ambulatory Visit: Payer: Self-pay | Admitting: Licensed Clinical Social Worker

## 2020-03-12 DIAGNOSIS — C3431 Malignant neoplasm of lower lobe, right bronchus or lung: Secondary | ICD-10-CM

## 2020-03-12 NOTE — Progress Notes (Signed)
[facility]  [address] [place], [place] [address] Phone: [phone]  Fax: [phone]   Clinic Day:  03/14/2020  Referring physician: Ronnell Freshwater, NP  Chief Complaint: Angela Jensen is a 64 y.o. female with a history of stage I lung cancer s/p radiationwho is seen for7 month assessment, review of chest CT, and discussion regarding direction of therapy.  HPI: The patient was last seen in the medical oncology clinic on 07/25/2019 via telemedicine. At that time, she felt "alright". She had stable shortness of breath with exertion. Hematocrit was 34.2, hemoglobin 10.5, MCV 85.7, platelets 284,000, WBC 6,100. CMP was normal except for glucose of 165. CEA was 8.2. We discussed plans for close surveillance.  The patient saw Eulogio Ditch, NP on 11/03/2019. There was evidence of severe atherosclerotic changes of both lower extremities with rest pain that was associated with preulcerative changes and impending tissue loss of the foot. The patient agreed to angiography. The procedure was scheduled for 11/13/2019 but was cancelled by the patient because of problems with her insurance.  Chest CT without contrast on 03/04/2020 revealed enlarging and new pulmonary nodules suspicious for metastatic disease or metachronous primary. There was stable appearance of post treatment changes. There was aortic atherosclerosis and emphysema.   The patient saw Dr. Humphrey Rolls on 03/11/2020 for a follow up regarding COPD. She continued 2 liters/min oxygen at night for hypoxia. She will follow up in 4 months.  The patient has a PET scan scheduled for 03/19/2020 (ordered by [place]).  During the interim, she has been "ok". She reports new right sided back pain. She denies any recent trauma or injury to the area. She still has shortness of breath on exertion. When she becomes short of breath, she uses oxygen. The leg pain she had a few months ago has resolved.   Past Medical  History:  Diagnosis Date  . Arthritis   . Asthma   . Atherosclerosis of native arteries of extremity with intermittent claudication ([place]) 05/26/2016  . Cancer [facility]) 2012   Right Lung CA  . COPD (chronic obstructive pulmonary disease) ([place])   . Depression   . Diabetes mellitus without complication [facility])    Patient takes Janumet  . Essential hypertension 05/26/2016  . Hypercholesteremia   . Hypertension   . 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 PAD (peripheral artery disease) ([place]) 06/22/2016  . Peripheral vascular disease ([place])   . Personal history of radiation therapy   . Shortness of breath dyspnea    with exertion   . Sleep apnea     Past Surgical History:  Procedure Laterality Date  . CESAREAN SECTION     x3  . COLONOSCOPY WITH PROPOFOL N/A 06/25/2015   Procedure: COLONOSCOPY WITH PROPOFOL;  Surgeon: Lucilla Lame, MD;  Location: [facility] ENDOSCOPY;  Service: Endoscopy;  Laterality: N/A;  . CYST REMOVAL LEG     and on shoulder   . LOWER EXTREMITY ANGIOGRAPHY Left 09/29/2018   Procedure: LOWER EXTREMITY ANGIOGRAPHY;  Surgeon: Algernon Huxley, MD;  Location: [place] [facility];  Service: Cardiovascular;  Laterality: Left;  . LUNG BIOPSY  05 15 2013   has lung "spots"  . PERIPHERAL VASCULAR CATHETERIZATION Left 06/01/2016   Procedure: Lower Extremity Angiography;  Surgeon: Algernon Huxley, MD;  Location: [place] [facility];  Service: Cardiovascular;  Laterality: Left;  . PERIPHERAL VASCULAR CATHETERIZATION N/A 06/01/2016   Procedure: Abdominal Aortogram w/Lower Extremity;  Surgeon: Algernon Huxley,  MD;  Location: [place] [facility];  Service: Cardiovascular;  Laterality: N/A;  . PERIPHERAL VASCULAR CATHETERIZATION  06/01/2016   Procedure: Lower Extremity Intervention;  Surgeon: Algernon Huxley, MD;  Location: [place] [facility];  Service: Cardiovascular;;  . PERIPHERAL VASCULAR CATHETERIZATION Right 06/08/2016   Procedure: Lower Extremity Angiography;  Surgeon: Algernon Huxley, MD;  Location:  [place] [facility];  Service: Cardiovascular;  Laterality: Right;  . PERIPHERAL VASCULAR CATHETERIZATION  06/08/2016   Procedure: Lower Extremity Intervention;  Surgeon: Algernon Huxley, MD;  Location: [place] [facility];  Service: Cardiovascular;;    Family History  Problem Relation Age of Onset  . Lung cancer Father   . Diabetes Mother   . Hypercholesterolemia Mother   . Diabetes Sister   . Diabetes Maternal Grandmother   . Diabetes Paternal Grandmother   . Diabetes Sister   . Heart attack Brother   . Coronary artery disease Brother   . Vascular Disease Brother   . Hypertension Sister   . Heart attack Brother     Social History:  reports that she quit smoking about 10 years ago. Her smoking use included cigarettes. She has a 37.00 pack-year smoking history. She has quit using smokeless tobacco.  Her smokeless tobacco use included snuff. She reports current alcohol use of about 5.0 standard drinks of alcohol per week. She reports previous drug use. Drugs: Marijuana, "Crack" cocaine, and Cocaine. She has not smoked for 1 month. She started smoking at 17 until she was "75 something". Her boyfriend is [name]: [phone]. She lives in [place]. The patient is alone today.  Allergies: No Known Allergies  Current Medications: Current Outpatient Medications  Medication Sig Dispense Refill  . Accu-Chek FastClix Lancets MISC Use as directed twice a day diag E11.65 100 each 1  . albuterol (PROVENTIL) (2.5 MG/3ML) 0.083% nebulizer solution Take 3 mLs (2.5 mg total) by nebulization every 6 (six) hours as needed for wheezing. 75 mL 3  . amLODipine (NORVASC) 10 MG tablet Take 1 tablet (10 mg total) by mouth every morning. 30 tablet 5  . aspirin EC 81 MG tablet Take 1 tablet (81 mg total) by mouth daily. 150 tablet 2  . buPROPion (WELLBUTRIN XL) 150 MG 24 hr tablet TAKE 1 TABLET BY MOUTH TWICE DAILY FOR SMOKING CESSATION 60 tablet 6  . clopidogrel (PLAVIX) 75 MG tablet Take 1 tablet by  mouth once daily 90 tablet 0  . gabapentin (NEURONTIN) 100 MG capsule Take 1 capsule by mouth twice daily 60 capsule 3  . glucose blood (ACCU-CHEK AVIVA PLUS) test strip Use as instructed to check blood sugars three times daily.  E11.65 250 each 5  . JANUVIA 25 MG tablet Take 1 tablet by mouth once daily 90 tablet 0  . lisinopril-hydrochlorothiazide (ZESTORETIC) 10-12.5 MG tablet Take 1 tablet by mouth every morning. 30 tablet 5  . meloxicam (MOBIC) 7.5 MG tablet Take 1 tablet (7.5 mg total) by mouth 2 (two) times daily. 60 tablet 3  . metFORMIN (GLUCOPHAGE-XR) 500 MG 24 hr tablet Take 1 tablet (500 mg total) by mouth daily with breakfast. 30 tablet 3  . mometasone-formoterol (DULERA) 200-5 MCG/ACT AERO Inhale 1 puff into the lungs 2 (two) times daily. Please fill as 90 day prescription 39 g 1  . OXYGEN Inhale 2 L into the lungs at bedtime.    . potassium chloride (K-DUR) 10 MEQ tablet TAKE 1 TABLET BY MOUTH EVERY OTHER DAY FOR  LOW  POTASSIUM 45 tablet 3  .  rOPINIRole (REQUIP) 0.5 MG tablet Take 1 tablet (0.5 mg total) by mouth 2 (two) times daily. 180 tablet 0  . rosuvastatin (CRESTOR) 10 MG tablet Take 1 tablet (10 mg total) by mouth at bedtime. 30 tablet 5  . SPIRIVA HANDIHALER 18 MCG inhalation capsule INHALE THE CONTENTS OF ONE CAPSULE ONCE DAILY. 90 capsule 0  . VENTOLIN HFA 108 (90 Base) MCG/ACT inhaler INHALE 2 PUFFS INTO LUNGS EVERY 4 HOURS AS NEEDED FOR WHEEZING OR SHORTNESS OF BREATH 18 g 3  . ALPRAZolam (XANAX) 0.25 MG tablet May take 1/2 to 1 tablet po one time one to two hours prior to flight. May repeat dose in one hour as needed for persistent anxiety. (Patient not taking: Reported on 03/11/2020) 10 tablet 0  . amoxicillin (AMOXIL) 875 MG tablet Take 1 tablet (875 mg total) by mouth 2 (two) times daily. (Patient not taking: Reported on 03/11/2020) 20 tablet 0  . diclofenac Sodium (VOLTAREN) 1 % GEL Apply 4 g topically 4 (four) times daily. (Patient not taking: Reported on 03/11/2020)  100 g 2   No current facility-administered medications for this visit.    Review of Systems  Constitutional: Negative.  Negative for chills, fever, malaise/fatigue and weight loss.       Feels "alright".  HENT: Negative.  Negative for congestion, ear discharge, ear pain, nosebleeds, sinus pain, sore throat and tinnitus.   Eyes: Negative.  Negative for double vision and photophobia.  Respiratory: Positive for shortness of breath (on exertion). Negative for cough, hemoptysis and sputum production.   Cardiovascular: Negative for chest pain (under breasts, resolved), palpitations, orthopnea, leg swelling and PND.  Gastrointestinal: Negative.  Negative for abdominal pain, blood in stool, constipation, diarrhea, melena, nausea and vomiting.  Genitourinary: Negative.  Negative for frequency, hematuria and urgency.  Musculoskeletal: Positive for back pain (right side). Negative for falls, joint pain, myalgias and neck pain.  Skin: Negative.  Negative for itching and rash.  Neurological: Negative.  Negative for dizziness, tingling, sensory change, speech change, focal weakness, weakness and headaches.  Endo/Heme/Allergies: Negative.  Does not bruise/bleed easily.       Diabetes.  Psychiatric/Behavioral: Negative for depression and memory loss. The patient is not nervous/anxious and does not have insomnia.   All other systems reviewed and are negative.   Performance status (ECOG): 1  Physical Exam Nursing note reviewed.  Constitutional:      General: She is not in acute distress.    Appearance: Normal appearance. She is not ill-appearing or diaphoretic.  HENT:     Head: Normocephalic and atraumatic.     Comments: Brown hair pulled back.    Mouth/Throat:     Mouth: Mucous membranes are moist.     Pharynx: No oropharyngeal exudate or posterior oropharyngeal erythema.  Eyes:     General: No scleral icterus.    Extraocular Movements: Extraocular movements intact.     Conjunctiva/sclera:  Conjunctivae normal.     Pupils: Pupils are equal, round, and reactive to light.     Comments: Glasses.  Brown eyes.  Cardiovascular:     Rate and Rhythm: Normal rate and regular rhythm.     Pulses: Normal pulses.     Heart sounds: Normal heart sounds. No murmur heard.  No gallop.   Pulmonary:     Effort: No respiratory distress.     Breath sounds: Normal breath sounds. No wheezing, rhonchi or rales.  Abdominal:     General: Bowel sounds are normal. There is no distension.  Palpations: Abdomen is soft. There is no hepatomegaly, splenomegaly or mass.     Tenderness: There is no abdominal tenderness. There is no guarding.  Musculoskeletal:        General: No swelling or tenderness. Normal range of motion.     Cervical back: Normal range of motion and neck supple. No tenderness.  Lymphadenopathy:     Head:     Right side of head: No preauricular, posterior auricular or occipital adenopathy.     Left side of head: No preauricular, posterior auricular or occipital adenopathy.     Cervical: No cervical adenopathy.     Upper Body:     Right upper body: No supraclavicular or axillary adenopathy.     Left upper body: No supraclavicular or axillary adenopathy.     Lower Body: No right inguinal adenopathy. No left inguinal adenopathy.  Skin:    General: Skin is warm and dry.     Coloration: Skin is not jaundiced or pale.     Findings: No bruising, erythema or rash.  Neurological:     Mental Status: She is alert and oriented to person, place, and time.     Motor: No weakness.     Gait: Gait normal.  Psychiatric:        Behavior: Behavior normal.        Thought Content: Thought content normal.        Judgment: Judgment normal.    Imaging studies: 07/31/2011:  Chest CT revealed a 1.1 x 0.6 cm RUL nodule and a 9 mm inferior aspect RUL nodule and a 4 mm nodule in the anterior aspect of the inferior aspect of the RUL. 08/03/2011:  PET scan revealed a 10-mm spiculated right apical  pulmonary nodule (SUV 2.5) suspcious for lung cancer. Left ovary was prominent (3.9 x 2.4 cm).  01/30/2015:  PET scanrevealed a hypermetabolic and enlarging RUL pulmonary nodules concerning for lung cancer recurrence. There was a small focus of peripheral nodular consolidation in the superior segment of the right lower lobe with differential including inflammation/infection versus neoplasm. There was no evidence of mediastinal nodal metastasis.  03/27/2016:  PET scanrevealed no hypermetabolic activity to suggest recurrent malignancy. There was a soft tissue density in the left pelvis near the inguinal ring that was not hypermetabolic, but prominent and probably separate from the left ovary. This could be a wandering fibroid. Although the overall appearance had not changed from 2012 and was not hypermetabolic, pelvic sonography or pelvic MRI for further characterization of the uterus and ovaries can be considered. 08/01/2015:  Chest CTrevealed complete resolution of the prior nodule in the right lung apex.  01/31/2016:  Chest CTrevealed no active cardiopulmonary abnormalities. There was stable appearance of right apical and right middle lobe scar like densities.  02/02/2017:  Low dose chest CTrevealed new ground-glass nodule in the right lower lobe. Lung-RADS 3 (probably benign findings).  07/29/2017:  Low dose chest CT revealed enlargement of the previously observed sub-solid pulmonary lesion. RIGHT upper lobe lesion measured 1.4 x 2.1 x 1.6 cm (previously 1.5 x 1.1 x 1.7 cm). There was also areas of focal architectural distortion in the RIGHT upper and middle lobes that were felt to be most compatible with areas of chronic post infectious or inflammatory scarring. Aortic value calcifications were noted. Lung-RADS 4BS (suspicious).  01/19/2018:  Chest CTrevealed a subsolid 2.1 cm peripheral right lower lobe pulmonary nodule. Although not appreciably changed in size, subjectively this nodule  had increased in density, and remains suspicious for  primary bronchogenic adenocarcinoma. There was no thoracic adenopathy. 07/18/2018:  Chest CT revealed early postradiation change in the peripheral right lower lobe at the site of the treated tumor, with no residual measurable pulmonary nodule in this location.There were no findings of metastatic disease in the chest. 01/17/2019:  Chest CT without contrast revealed decreased subpleural reticulation at the SBRT site in the RIGHT lower lobe consistent with improved radiation change. There was residual nodularity along the fissure at this site with recommendation for close attention on follow-up. There was stable pulmonary scarring in the right upper lobe and right middle lobe. There was centrilobular emphysema. 07/19/2019:  Chest CT revealed a 6 x 6 mm nodule in the right lower lobe had increased in size from 3 mm previously. The nodule was separate and discrete from the previous treatment beds. Metachronous or metastatic disease a concern. Close CT follow up was recommended. There was a 5 mm right lower lobe pulmonary nodule new in the interval. Close attention on follow up was recommended. There was stable appearance of parenchymal scarring in the right lung period.  03/04/2020:  Chest CT revealed enlarging and new pulmonary nodules suspicious for metastatic disease or metachronous primary. There was stable appearance of post treatment changes. There was aortic atherosclerosis and emphysema.    No visits with results within 3 Day(s) from this visit.  Latest known visit with results is:  Office Visit on 01/01/2020  Component Date Value Ref Range Status  . Hemoglobin A1C 01/01/2020 6.2* 4.0 - 5.6 % Final  . Specific Gravity, UA 01/01/2020 1.022  1.005 - 1.030 Final  . pH, UA 01/01/2020 6.0  5.0 - 7.5 Final  . Color, UA 01/01/2020 Yellow  Yellow Final  . Appearance Ur 01/01/2020 Clear  Clear Final  . Leukocytes,UA 01/01/2020 Negative  Negative Final   . Protein,UA 01/01/2020 Trace  Negative/Trace Final  . Glucose, UA 01/01/2020 Negative  Negative Final  . Ketones, UA 01/01/2020 Negative  Negative Final  . RBC, UA 01/01/2020 Negative  Negative Final  . Bilirubin, UA 01/01/2020 Negative  Negative Final  . Urobilinogen, Ur 01/01/2020 1.0  0.2 - 1.0 mg/dL Final  . Nitrite, UA 01/01/2020 Negative  Negative Final  . Microscopic Examination 01/01/2020 Comment   Final   Microscopic not indicated and not performed.    Assessment:  Angela Jensen is a 64 y.o. 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 She was not a good surgery candidate secondary to her lung function (FEV1 28%).  PET scan on 08/03/2011 revealed a 10-mm spiculated right apical pulmonary nodule (SUV 2.5) suspcious for lung cancer. Left ovary was prominent (3.9 x 2.4 cm). CA-125 was 9.3 (normal).  CT guided biopsy of the superior and inferior RUL noduleson 12/30/2011 revealed well differentiated adenocarcinoma of lung primary. EGFR testing revealed no mutation. She received IMRT 6000 cGyto both nodules from 01/26/2012 - 03/10/2012.  PET scanon 01/30/2015 revealed a hypermetabolic and enlarging RUL pulmonary nodules concerning for lung cancer recurrence. There was a small focus of peripheral nodular consolidation in the superior segment of the right lower lobe with differential including inflammation/infection versus neoplasm. There was no evidence of mediastinal nodal metastasis.   In 04/2015, there was an attempt at radiofrequency ablation of 1 enlarging RUL nodule. At the time of the procedure, the nodule had shrunk to 5 mm from 9 mm.  Chest CTon 01/19/2018 revealed a subsolid 2.1 cm peripheral right lower lobe pulmonary nodule. Although not appreciably changed in  size, subjectively this nodule had increased in density, and remains suspicious for primary bronchogenic adenocarcinoma. There was no thoracic  adenopathy.  CT guided RLL biopsyon 02/09/2018 revealed adenocarcinoma of lung origin, lepidic and papillary patterns.   She received6000 cGy in 5 fractions toRLL nodulefrom 03/22/2018 - 04/05/2018.  Chest CT on 07/19/2019 revealed a 6 x 6 mm nodule in the right lower lobe had increased in size from 3 mm previously. The nodule was separate and discrete from the previous treatment beds. Metachronous or metastatic disease a concern. Close CT follow up was recommended. There was a 5 mm right lower lobe pulmonary nodule new in the interval. Close attention on follow up recommended. There was stable appearance of parenchymal scarring in the right lung period.   Chest CT on 03/04/2020 revealed enlarging and new pulmonary nodules suspicious for metastatic disease or metachronous primary.  She has an elevated CEAof unclear etiology. CEA was 8.1 on 01/31/2016, 9.2 on 03/03/2016, 7.9 on 10/12/2017, and 7.9 on 01/19/2019.  Colonoscopyon 06/25/2015 revealed a 4 mm polyp in the sigmoid colon. Pathology revealed a tubular adenoma without evidence of dysplasia or malignancy.  Pelvic MRIon 04/30/2016 revealed a 3.4 x 2.5 cm ovoid mass in the LEFT adnexa with signal characteristics identical to the intramural leiomyomas and consistent with an exophytic leiomyoma. 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 Endometrium was normal with a normal junctional zone. Ovaries were not identified.  Patient has a history of an elevated total protein.  Protein was 8.3 on 01/19/2019.  M spike and FLCA were normal.    The patient received the [place] COVID-19 vaccine on 11/20/2019 and 12/11/2019.  Symptomatically, she has felt "ok". She has new right sided back pain. She has shortness of breath on exertion. Leg pain has resolved.  Plan: 1.  Review interval chest CT. 2.Stage I adenocarcinoma RUL Patient is s/p IMRT (completed 03/10/2012). Review chest CT from 03/04/2020.  Images  personally reviewed.  Agree with radiology.   There are some new and enlarging pulmonary nodules.   Nodules ranged between 3-8 mm.   Discuss plan for short-term follow-up chest CT.   Patient notes PET scan scheduled by Dr. Baruch Gouty.  Review PET scan at tumor board. 3.RLL adenocarcinoma Patient is s/p SBRT (completed 04/05/2018). The right lower lobe lesion measures 8 mm (previously 5 mm)   Follow-up imaging as above. 4.   Tumor board on 03/21/2020. 5.   RTC on 03/21/2020 in the afternoon for discussion regarding direction of therapy.  I discussed the assessment and treatment plan with the patient.  The patient was provided an opportunity to ask questions and all were answered.  The patient agreed with the plan and demonstrated an understanding of the instructions.  The patient was advised to call back or seek an in person evaluation if the symptoms worsen or if the condition fails to improve as anticipated.  I provided 13 minutes of face-to-face time during this this encounter and > 50% was spent counseling as documented under my assessment and plan.  An additional 5 minutes were spent reviewing her chart (Epic and Care Everywhere) including notes, labs, and imaging studies.    Nolon Stalls, MD, PhD  03/14/2020, 9:50 AM  I, Mirian Mo Tufford, am acting as Education administrator for [employer]. Mike Gip, MD, PhD.  I, Peaches Vanoverbeke C. Mike Gip, MD, have reviewed the above documentation for accuracy and completeness, and I agree with the above.

## 2020-03-13 ENCOUNTER — Encounter: Payer: Self-pay | Admitting: Hematology and Oncology

## 2020-03-13 ENCOUNTER — Other Ambulatory Visit: Payer: Self-pay

## 2020-03-13 NOTE — Progress Notes (Signed)
No new changes noted today. The patient name and DOB has been verified by phone today. 

## 2020-03-14 ENCOUNTER — Encounter: Payer: Self-pay | Admitting: Hematology and Oncology

## 2020-03-14 ENCOUNTER — Inpatient Hospital Stay: Payer: BLUE CROSS/BLUE SHIELD | Attending: Hematology and Oncology | Admitting: Hematology and Oncology

## 2020-03-14 VITALS — BP 130/69 | HR 99 | Temp 97.7°F | Resp 18 | Ht 59.0 in | Wt 107.0 lb

## 2020-03-14 DIAGNOSIS — M545 Low back pain: Secondary | ICD-10-CM | POA: Diagnosis not present

## 2020-03-14 DIAGNOSIS — R0602 Shortness of breath: Secondary | ICD-10-CM | POA: Insufficient documentation

## 2020-03-14 DIAGNOSIS — Z87891 Personal history of nicotine dependence: Secondary | ICD-10-CM | POA: Insufficient documentation

## 2020-03-14 DIAGNOSIS — Z85118 Personal history of other malignant neoplasm of bronchus and lung: Secondary | ICD-10-CM | POA: Diagnosis not present

## 2020-03-14 DIAGNOSIS — Z9981 Dependence on supplemental oxygen: Secondary | ICD-10-CM | POA: Diagnosis not present

## 2020-03-14 DIAGNOSIS — J449 Chronic obstructive pulmonary disease, unspecified: Secondary | ICD-10-CM | POA: Insufficient documentation

## 2020-03-14 DIAGNOSIS — C3411 Malignant neoplasm of upper lobe, right bronchus or lung: Secondary | ICD-10-CM

## 2020-03-14 DIAGNOSIS — Z923 Personal history of irradiation: Secondary | ICD-10-CM | POA: Diagnosis not present

## 2020-03-14 DIAGNOSIS — C3431 Malignant neoplasm of lower lobe, right bronchus or lung: Secondary | ICD-10-CM | POA: Diagnosis not present

## 2020-03-14 DIAGNOSIS — Z7189 Other specified counseling: Secondary | ICD-10-CM | POA: Diagnosis not present

## 2020-03-14 DIAGNOSIS — E119 Type 2 diabetes mellitus without complications: Secondary | ICD-10-CM | POA: Diagnosis not present

## 2020-03-19 ENCOUNTER — Other Ambulatory Visit: Payer: Self-pay

## 2020-03-19 ENCOUNTER — Ambulatory Visit
Admission: RE | Admit: 2020-03-19 | Discharge: 2020-03-19 | Disposition: A | Discharge status: Home / Self Care | Payer: BLUE CROSS/BLUE SHIELD | Source: Ambulatory Visit | Attending: Radiation Oncology | Admitting: Radiation Oncology

## 2020-03-19 DIAGNOSIS — J439 Emphysema, unspecified: Secondary | ICD-10-CM | POA: Insufficient documentation

## 2020-03-19 DIAGNOSIS — C3431 Malignant neoplasm of lower lobe, right bronchus or lung: Secondary | ICD-10-CM | POA: Diagnosis not present

## 2020-03-19 LAB — GLUCOSE, CAPILLARY: Glucose-Capillary: 94 mg/dL (ref 70–99)

## 2020-03-19 MED ORDER — FLUDEOXYGLUCOSE F - 18 (FDG) INJECTION
5.5000 | Freq: Once | INTRAVENOUS | Status: AC | PRN
  Administered 2020-03-19: 5.53 via INTRAVENOUS

## 2020-03-20 NOTE — Progress Notes (Signed)
[facility]  [address] [place], [place] [address] Phone: [phone]  Fax: [phone]   Clinic Day:  03/21/2020  Referring physician: Ronnell Freshwater, NP  Chief Complaint: Angela Jensen is a 64 y.o. female with a history of stage I lung cancer s/p radiationwho is seen for review of interval PET scan and discussion regarding direction of therapy.  HPI: The patient was last seen in the medical oncology clinic on 03/14/2020. At that time, she felt "ok". She described new right sided back pain.  She had chronic shortness of breath on exertion; she used oxygen prn. The leg pain she had a few months ago had resolved.  Chest CT on 03/04/2020 had revealed enlarging and new pulmonary nodules suspicious for metastatic disease or metachronous primary.   PET scan on 03/19/2020 revealed that the right lower lobe pulmonary nodules did not demonstrate any hypermetabolism to suggest neoplasm. However, these were somewhat worrisome; recommendation was close CT surveillance. There was no mediastinal or hilar lymphadenopathy. There were stable right lung scarring changes and significant underlying emphysema.  Tumor board today recommended biopsy of the peripheral RLL lesion.  If malignant, referral for SBRT.  During the interim, she has felt "ok".  She has been having on and off back pain. She is on Plavix and baby aspirin. These medications are managed by Dr. Lucky Cowboy, but she has not seen him in a couple of months. She has had a stent for over a year and notes that she needs a new vascular doctor.  The patient is agreeable with the treatment plan recommended by tumor board.   Past Medical History:  Diagnosis Date  . Arthritis   . Asthma   . Atherosclerosis of native arteries of extremity with intermittent claudication ([place]) 05/26/2016  . Cancer [facility]) 2012   Right Lung CA  . COPD (chronic obstructive pulmonary disease) ([place])   . Depression   . Diabetes mellitus  without complication [facility])    Patient takes Janumet  . Essential hypertension 05/26/2016  . Hypercholesteremia   . Hypertension   . 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 PAD (peripheral artery disease) ([place]) 06/22/2016  . Peripheral vascular disease ([place])   . Personal history of radiation therapy   . Shortness of breath dyspnea    with exertion   . Sleep apnea     Past Surgical History:  Procedure Laterality Date  . CESAREAN SECTION     x3  . COLONOSCOPY WITH PROPOFOL N/A 06/25/2015   Procedure: COLONOSCOPY WITH PROPOFOL;  Surgeon: Lucilla Lame, MD;  Location: [facility] ENDOSCOPY;  Service: Endoscopy;  Laterality: N/A;  . CYST REMOVAL LEG     and on shoulder   . LOWER EXTREMITY ANGIOGRAPHY Left 09/29/2018   Procedure: LOWER EXTREMITY ANGIOGRAPHY;  Surgeon: Algernon Huxley, MD;  Location: [place] CV LAB;  Service: Cardiovascular;  Laterality: Left;  . LUNG BIOPSY  05 15 2013   has lung "spots"  . PERIPHERAL VASCULAR CATHETERIZATION Left 06/01/2016   Procedure: Lower Extremity Angiography;  Surgeon: Algernon Huxley, MD;  Location: [place] CV LAB;  Service: Cardiovascular;  Laterality: Left;  . PERIPHERAL VASCULAR CATHETERIZATION N/A 06/01/2016   Procedure: Abdominal Aortogram w/Lower Extremity;  Surgeon: Algernon Huxley, MD;  Location: [place] CV LAB;  Service: Cardiovascular;  Laterality: N/A;  . PERIPHERAL VASCULAR CATHETERIZATION  06/01/2016   Procedure: Lower Extremity Intervention;  Surgeon: Algernon Huxley, MD;  Location: [place] CV LAB;  Service: Cardiovascular;;  . PERIPHERAL VASCULAR CATHETERIZATION Right 06/08/2016   Procedure: Lower Extremity Angiography;  Surgeon: Algernon Huxley, MD;  Location: [place] CV LAB;  Service: Cardiovascular;  Laterality: Right;  . PERIPHERAL VASCULAR CATHETERIZATION  06/08/2016   Procedure: Lower Extremity Intervention;  Surgeon: Algernon Huxley, MD;  Location: [place] CV LAB;  Service: Cardiovascular;;    Family History  Problem  Relation Age of Onset  . Lung cancer Father   . Diabetes Mother   . Hypercholesterolemia Mother   . Diabetes Sister   . Diabetes Maternal Grandmother   . Diabetes Paternal Grandmother   . Diabetes Sister   . Heart attack Brother   . Coronary artery disease Brother   . Vascular Disease Brother   . Hypertension Sister   . Heart attack Brother     Social History:  reports that she quit smoking about 10 years ago. Her smoking use included cigarettes. She has a 37.00 pack-year smoking history. She has quit using smokeless tobacco.  Her smokeless tobacco use included snuff. She reports current alcohol use of about 5.0 standard drinks of alcohol per week. She reports previous drug use. Drugs: Marijuana, "Crack" cocaine, and Cocaine. She has not smoked for 1 month. She started smoking at 17 until she was "51 something". Her boyfriend is [name]: [phone]. She lives in [place]. The patient is alone today.  Allergies: No Known Allergies  Current Medications: Current Outpatient Medications  Medication Sig Dispense Refill  . Accu-Chek FastClix Lancets MISC Use as directed twice a day diag E11.65 100 each 1  . albuterol (PROVENTIL) (2.5 MG/3ML) 0.083% nebulizer solution Take 3 mLs (2.5 mg total) by nebulization every 6 (six) hours as needed for wheezing. 75 mL 3  . amLODipine (NORVASC) 10 MG tablet Take 1 tablet (10 mg total) by mouth every morning. 30 tablet 5  . aspirin EC 81 MG tablet Take 1 tablet (81 mg total) by mouth daily. 150 tablet 2  . buPROPion (WELLBUTRIN XL) 150 MG 24 hr tablet TAKE 1 TABLET BY MOUTH TWICE DAILY FOR SMOKING CESSATION 60 tablet 6  . clopidogrel (PLAVIX) 75 MG tablet Take 1 tablet by mouth once daily 90 tablet 0  . gabapentin (NEURONTIN) 100 MG capsule Take 1 capsule by mouth twice daily 60 capsule 3  . glucose blood (ACCU-CHEK AVIVA PLUS) test strip Use as instructed to check blood sugars three times daily.  E11.65 250 each 5  . JANUVIA 25 MG tablet Take 1  tablet by mouth once daily 90 tablet 0  . lisinopril-hydrochlorothiazide (ZESTORETIC) 10-12.5 MG tablet Take 1 tablet by mouth every morning. 30 tablet 5  . meloxicam (MOBIC) 7.5 MG tablet Take 1 tablet (7.5 mg total) by mouth 2 (two) times daily. 60 tablet 3  . metFORMIN (GLUCOPHAGE-XR) 500 MG 24 hr tablet Take 1 tablet (500 mg total) by mouth daily with breakfast. 30 tablet 3  . mometasone-formoterol (DULERA) 200-5 MCG/ACT AERO Inhale 1 puff into the lungs 2 (two) times daily. Please fill as 90 day prescription 39 g 1  . OXYGEN Inhale 2 L into the lungs at bedtime.    . potassium chloride (K-DUR) 10 MEQ tablet TAKE 1 TABLET BY MOUTH EVERY OTHER DAY FOR  LOW  POTASSIUM 45 tablet 3  . rOPINIRole (REQUIP) 0.5 MG tablet Take 1 tablet (0.5 mg total) by mouth 2 (two) times daily. 180 tablet 0  . SPIRIVA HANDIHALER 18 MCG inhalation capsule INHALE THE CONTENTS OF ONE CAPSULE ONCE DAILY. [place]  capsule 0  . VENTOLIN HFA 108 (90 Base) MCG/ACT inhaler INHALE 2 PUFFS INTO LUNGS EVERY 4 HOURS AS NEEDED FOR WHEEZING OR SHORTNESS OF BREATH 18 g 3  . ALPRAZolam (XANAX) 0.25 MG tablet May take 1/2 to 1 tablet po one time one to two hours prior to flight. May repeat dose in one hour as needed for persistent anxiety. (Patient not taking: Reported on 03/11/2020) 10 tablet 0  . amoxicillin (AMOXIL) 875 MG tablet Take 1 tablet (875 mg total) by mouth 2 (two) times daily. (Patient not taking: Reported on 03/11/2020) 20 tablet 0  . diclofenac Sodium (VOLTAREN) 1 % GEL Apply 4 g topically 4 (four) times daily. (Patient not taking: Reported on 03/11/2020) 100 g 2  . rosuvastatin (CRESTOR) 10 MG tablet Take 1 tablet (10 mg total) by mouth at bedtime. (Patient not taking: Reported on 03/21/2020) 30 tablet 5   No current facility-administered medications for this visit.    Review of Systems  Constitutional: Negative.  Negative for chills, fever, malaise/fatigue and weight loss (up 1 lb).  HENT: Negative.  Negative for congestion,  ear discharge, ear pain, hearing loss, nosebleeds, sinus pain, sore throat and tinnitus.   Eyes: Negative.  Negative for blurred vision, double vision and photophobia.  Respiratory: Negative.  Negative for cough, hemoptysis, sputum production and shortness of breath.   Cardiovascular: Negative.  Negative for chest pain, palpitations, orthopnea, leg swelling and PND.  Gastrointestinal: Negative.  Negative for abdominal pain, blood in stool, constipation, diarrhea, heartburn, melena, nausea and vomiting.  Genitourinary: Negative.  Negative for dysuria, frequency, hematuria and urgency.  Musculoskeletal: Positive for back pain (comes and goes). Negative for falls, joint pain, myalgias and neck pain.  Skin: Negative.  Negative for itching and rash.  Neurological: Negative.  Negative for dizziness, tingling, sensory change, speech change, focal weakness, weakness and headaches.  Endo/Heme/Allergies: Negative.  Does not bruise/bleed easily.       Diabetes.  Psychiatric/Behavioral: Negative.  Negative for depression and memory loss. The patient is not nervous/anxious and does not have insomnia.   All other systems reviewed and are negative.   Performance status (ECOG): 1  Vitals:  Blood pressure (!) 125/98, pulse 97, temperature 98.8 F (37.1 C), temperature source Tympanic, weight 108 lb 9.2 oz (49.2 kg), SpO2 100 %.   Physical Exam Vitals and nursing note reviewed.  HENT:     Head:     Comments: Dark hair pulled back. Eyes:     General: No scleral icterus.    Conjunctiva/sclera: Conjunctivae normal.     Comments: Brown eyes.  Neurological:     Mental Status: She is alert and oriented to person, place, and time.  Psychiatric:        Behavior: Behavior normal.        Thought Content: Thought content normal.        Judgment: Judgment normal.    Imaging studies: 07/31/2011:  Chest CT revealed a 1.1 x 0.6 cm RUL nodule and a 9 mm inferior aspect RUL nodule and a 4 mm nodule in the anterior  aspect of the inferior aspect of the RUL. 08/03/2011:  PET scan revealed a 10-mm spiculated right apical pulmonary nodule (SUV 2.5) suspcious for lung cancer. Left ovary was prominent (3.9 x 2.4 cm).  01/30/2015:  PET scanrevealed a hypermetabolic and enlarging RUL pulmonary nodules concerning for lung cancer recurrence. There was a small focus of peripheral nodular consolidation in the superior segment of the right lower lobe with differential including  inflammation/infection versus neoplasm. There was no evidence of mediastinal nodal metastasis.  03/27/2016:  PET scanrevealed no hypermetabolic activity to suggest recurrent malignancy. There was a soft tissue density in the left pelvis near the inguinal ring that was not hypermetabolic, but prominent and probably separate from the left ovary. This could be a wandering fibroid. Although the overall appearance had not changed from 2012 and was not hypermetabolic, pelvic sonography or pelvic MRI for further characterization of the uterus and ovaries can be considered. 08/01/2015:  Chest CTrevealed complete resolution of the prior nodule in the right lung apex.  01/31/2016:  Chest CTrevealed no active cardiopulmonary abnormalities. There was stable appearance of right apical and right middle lobe scar like densities.  02/02/2017:  Low dose chest CTrevealed new ground-glass nodule in the right lower lobe. Lung-RADS 3 (probably benign findings).  07/29/2017:  Low dose chest CT revealed enlargement of the previously observed sub-solid pulmonary lesion. RIGHT upper lobe lesion measured 1.4 x 2.1 x 1.6 cm (previously 1.5 x 1.1 x 1.7 cm). There was also areas of focal architectural distortion in the RIGHT upper and middle lobes that were felt to be most compatible with areas of chronic post infectious or inflammatory scarring. Aortic value calcifications were noted. Lung-RADS 4BS (suspicious).  01/19/2018:  Chest CTrevealed a subsolid 2.1 cm  peripheral right lower lobe pulmonary nodule. Although not appreciably changed in size, subjectively this nodule had increased in density, and remains suspicious for primary bronchogenic adenocarcinoma. There was no thoracic adenopathy. 07/18/2018:  Chest CT revealed early postradiation change in the peripheral right lower lobe at the site of the treated tumor, with no residual measurable pulmonary nodule in this location.There were no findings of metastatic disease in the chest. 01/17/2019:  Chest CT without contrast revealed decreased subpleural reticulation at the SBRT site in the RIGHT lower lobe consistent with improved radiation change. There was residual nodularity along the fissure at this site with recommendation for close attention on follow-up. There was stable pulmonary scarring in the right upper lobe and right middle lobe. There was centrilobular emphysema. 07/19/2019:  Chest CT revealed a 6 x 6 mm nodule in the right lower lobe had increased in size from 3 mm previously. The nodule was separate and discrete from the previous treatment beds. Metachronous or metastatic disease a concern. Close CT follow up was recommended. There was a 5 mm right lower lobe pulmonary nodule new in the interval. Close attention on follow up was recommended. There was stable appearance of parenchymal scarring in the right lung period.  03/04/2020:  Chest CT revealed enlarging and new pulmonary nodules suspicious for metastatic disease or metachronous primary.  03/19/2020:  PET scan revealed that the right lower lobe pulmonary nodules did not demonstrate any hypermetabolism to suggest neoplasm. However, these were somewhat worrisome; recommendation was close CT surveillance. There was no mediastinal or hilar lymphadenopathy. There were stable right lung scarring changes and significant underlying emphysema.   Hospital Outpatient Visit on 03/19/2020  Component Date Value Ref Range Status  . Glucose-Capillary  03/19/2020 94  70 - 99 mg/dL Final   Glucose reference range applies only to samples taken after fasting for at least 8 hours.    Assessment:  Angela Jensen is a 64 y.o. 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 She was not a good surgery candidate secondary to her lung function (FEV1 28%).  PET scan on 08/03/2011 revealed a 10-mm spiculated right apical pulmonary nodule (SUV 2.5)  suspcious for lung cancer. Left ovary was prominent (3.9 x 2.4 cm). CA-125 was 9.3 (normal).  CT guided biopsy of the superior and inferior RUL noduleson 12/30/2011 revealed well differentiated adenocarcinoma of lung primary. EGFR testing revealed no mutation. She received IMRT 6000 cGyto both nodules from 01/26/2012 - 03/10/2012.  PET scanon 01/30/2015 revealed a hypermetabolic and enlarging RUL pulmonary nodules concerning for lung cancer recurrence. There was a small focus of peripheral nodular consolidation in the superior segment of the right lower lobe with differential including inflammation/infection versus neoplasm. There was no evidence of mediastinal nodal metastasis.   In 04/2015, there was an attempt at radiofrequency ablation of 1 enlarging RUL nodule. At the time of the procedure, the nodule had shrunk to 5 mm from 9 mm.  Chest CTon 01/19/2018 revealed a subsolid 2.1 cm peripheral right lower lobe pulmonary nodule. Although not appreciably changed in size, subjectively this nodule had increased in density, and remains suspicious for primary bronchogenic adenocarcinoma. There was no thoracic adenopathy.  CT guided RLL biopsyon 02/09/2018 revealed adenocarcinoma of lung origin, lepidic and papillary patterns.  She received6000 cGy in 5 fractions toRLL nodulefrom 03/22/2018 - 04/05/2018.  Chest CT on 03/04/2020 revealed enlarging and new pulmonary nodules suspicious for metastatic disease or metachronous primary.  PET scan on  03/19/2020 revealed that the right lower lobe pulmonary nodules did not demonstrate any hypermetabolism to suggest neoplasm. However, these were somewhat worrisome; recommendation was close CT surveillance. There was no mediastinal or hilar lymphadenopathy.   She has an elevated CEAof unclear etiology. CEA was 8.1 on 01/31/2016, 9.2 on 03/03/2016, 7.9 on 10/12/2017, and 7.9 on 01/19/2019.  Colonoscopyon 06/25/2015 revealed a 4 mm polyp in the sigmoid colon. Pathology revealed a tubular adenoma without evidence of dysplasia or malignancy.  Pelvic MRIon 04/30/2016 revealed a 3.4 x 2.5 cm ovoid mass in the LEFT adnexa with signal characteristics identical to the intramural leiomyomas and consistent with an exophytic leiomyoma. 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 Endometrium was normal with a normal junctional zone. Ovaries were not identified.  She has a history of an elevated total protein.  Protein was 8.3 (6.5-8.1) on 01/19/2019.  M-spike and FLCA were normal.  The patient received the [place] COVID-19 vaccine on 11/20/2019 and 12/11/2019.  Symptomatically, she is doing well.  She notes intermittent back pain.  Plan: 1.   Stage I adenocarcinoma RUL Patient is s/p IMRT (completed 03/10/2012). Chest CT from 03/04/2020 revealed new and enlarging pulmonary nodules.  PET scan was personally reviewed as well as reviewed with patient.  Agree with radiology interpretation.   RLL pulmonary nodules are 10 mm and 7 mm.   Nodules have increased in size, but have no hypermetabolism.   Etiology may be a low grade adenocarcinoma.  Discuss recommendation of biopsy from tumor board.   If biopsy + then SBRT.   Patient in agreement.  Discuss contacting Dr Lucky Cowboy regarding holding Plavix and aspirin for biopsy. 2.RLL adenocarcinoma Patient is s/p SBRT (completed 04/05/2018). As above, PET scan reveals enlarging RLL nodule.  Anticipate biopsy of  larger nodule. 3.   CT guided RLL lung biopsy. 4.   Patient on aspirin and Plavix per Dr Lucky Cowboy.  Need clearance prior to holding medication for biopsy. 5.   RTC 3 days after CT guided biopsy to discuss direction of therapy.  I discussed the assessment and treatment plan with the patient.  The patient was provided an opportunity to ask questions and all were answered.  The patient agreed with the plan  and demonstrated an understanding of the instructions.  The patient was advised to call back or seek an in person evaluation if the symptoms worsen or if the condition fails to improve as anticipated.   Nolon Stalls, MD, PhD  03/21/2020, 3:23 PM  I, Mirian Mo Tufford, am acting as Education administrator for [employer]. Mike Gip, MD, PhD.  I, Jacquelyn Antony C. Mike Gip, MD, have reviewed the above documentation for accuracy and completeness, and I agree with the above.

## 2020-03-21 ENCOUNTER — Telehealth: Payer: Self-pay

## 2020-03-21 ENCOUNTER — Inpatient Hospital Stay: Payer: BLUE CROSS/BLUE SHIELD | Attending: Hematology and Oncology | Admitting: Hematology and Oncology

## 2020-03-21 ENCOUNTER — Other Ambulatory Visit: Payer: Self-pay

## 2020-03-21 ENCOUNTER — Encounter: Payer: Self-pay | Admitting: Hematology and Oncology

## 2020-03-21 VITALS — BP 125/98 | HR 97 | Temp 98.8°F | Wt 108.6 lb

## 2020-03-21 DIAGNOSIS — C3411 Malignant neoplasm of upper lobe, right bronchus or lung: Secondary | ICD-10-CM

## 2020-03-21 DIAGNOSIS — M549 Dorsalgia, unspecified: Secondary | ICD-10-CM | POA: Insufficient documentation

## 2020-03-21 DIAGNOSIS — Z8719 Personal history of other diseases of the digestive system: Secondary | ICD-10-CM | POA: Insufficient documentation

## 2020-03-21 DIAGNOSIS — Z8349 Family history of other endocrine, nutritional and metabolic diseases: Secondary | ICD-10-CM | POA: Insufficient documentation

## 2020-03-21 DIAGNOSIS — Z87891 Personal history of nicotine dependence: Secondary | ICD-10-CM | POA: Diagnosis not present

## 2020-03-21 DIAGNOSIS — E119 Type 2 diabetes mellitus without complications: Secondary | ICD-10-CM | POA: Diagnosis not present

## 2020-03-21 DIAGNOSIS — Z79899 Other long term (current) drug therapy: Secondary | ICD-10-CM | POA: Diagnosis not present

## 2020-03-21 DIAGNOSIS — Z923 Personal history of irradiation: Secondary | ICD-10-CM | POA: Diagnosis not present

## 2020-03-21 DIAGNOSIS — R7989 Other specified abnormal findings of blood chemistry: Secondary | ICD-10-CM | POA: Diagnosis not present

## 2020-03-21 DIAGNOSIS — R911 Solitary pulmonary nodule: Secondary | ICD-10-CM | POA: Diagnosis not present

## 2020-03-21 DIAGNOSIS — Z7902 Long term (current) use of antithrombotics/antiplatelets: Secondary | ICD-10-CM | POA: Diagnosis not present

## 2020-03-21 DIAGNOSIS — Z801 Family history of malignant neoplasm of trachea, bronchus and lung: Secondary | ICD-10-CM | POA: Diagnosis not present

## 2020-03-21 DIAGNOSIS — Z833 Family history of diabetes mellitus: Secondary | ICD-10-CM | POA: Insufficient documentation

## 2020-03-21 DIAGNOSIS — C3431 Malignant neoplasm of lower lobe, right bronchus or lung: Secondary | ICD-10-CM | POA: Diagnosis not present

## 2020-03-21 DIAGNOSIS — I10 Essential (primary) hypertension: Secondary | ICD-10-CM | POA: Insufficient documentation

## 2020-03-21 DIAGNOSIS — D251 Intramural leiomyoma of uterus: Secondary | ICD-10-CM | POA: Diagnosis not present

## 2020-03-21 DIAGNOSIS — Z7189 Other specified counseling: Secondary | ICD-10-CM | POA: Diagnosis not present

## 2020-03-21 MED ORDER — ROPINIROLE HCL 0.5 MG PO TABS: 0.5000 mg | ORAL_TABLET | Freq: Two times a day (BID) | ORAL | 0 refills | Status: DC

## 2020-03-21 NOTE — Telephone Encounter (Signed)
Done

## 2020-03-21 NOTE — Progress Notes (Signed)
Patient here for oncology follow-up appointment, new back pain comes and goes with tiredness.

## 2020-03-25 ENCOUNTER — Telehealth: Payer: Self-pay

## 2020-03-25 ENCOUNTER — Other Ambulatory Visit: Payer: Self-pay | Admitting: Adult Health

## 2020-03-25 ENCOUNTER — Other Ambulatory Visit (INDEPENDENT_AMBULATORY_CARE_PROVIDER_SITE_OTHER): Payer: Self-pay | Admitting: Nurse Practitioner

## 2020-03-25 DIAGNOSIS — I10 Essential (primary) hypertension: Secondary | ICD-10-CM

## 2020-03-25 DIAGNOSIS — I739 Peripheral vascular disease, unspecified: Secondary | ICD-10-CM

## 2020-03-25 NOTE — Telephone Encounter (Signed)
As per dfk advised pt that we can hold potassium until we recheck labs

## 2020-03-26 ENCOUNTER — Telehealth: Payer: Self-pay

## 2020-03-26 NOTE — Telephone Encounter (Signed)
I have faxed over a on letter head which states the patient is current taking Plavix and ASA and need to be cleared by Dr Lucky Cowboy for a biposy for RLL. fax conformation has been confirmed.

## 2020-03-26 NOTE — Telephone Encounter (Signed)
The patient orders for RLL biopsy has been [facility]ed to [facility]. Fax conformation has been confirmed

## 2020-03-27 ENCOUNTER — Telehealth: Payer: Self-pay

## 2020-03-27 ENCOUNTER — Telehealth: Payer: Self-pay | Admitting: *Deleted

## 2020-03-27 ENCOUNTER — Telehealth: Payer: Self-pay | Admitting: Hematology and Oncology

## 2020-03-27 NOTE — Telephone Encounter (Signed)
  Clearance and stopping blood thinners (aspirin and Plavix) per Dr Lucky Cowboy.  The biopsy may need to be postponed.  M

## 2020-03-27 NOTE — Telephone Encounter (Signed)
Patient called asking when she is to stop taking her blood thinners for her biopsy on Monday. Please return her call

## 2020-03-27 NOTE — Telephone Encounter (Signed)
I called patient to confirm Covid Test appt for 8/12 between 8-1 pm. She understands this must be done before her CT lung biopsy on Monday 8/16. Patient understood and confirmed her appts.

## 2020-03-27 NOTE — Telephone Encounter (Signed)
Left a message To inform the patient that i have reached out to Dr Lucky Cowboy office to see if they can clear her for lung biposy. The patient is currently taking plavix and ASA. No return inform at this time.

## 2020-03-27 NOTE — Telephone Encounter (Signed)
I believe she was given a lab slip at her last visit to check labs.

## 2020-03-28 ENCOUNTER — Telehealth: Payer: Self-pay

## 2020-03-28 ENCOUNTER — Other Ambulatory Visit
Admission: RE | Admit: 2020-03-28 | Payer: BLUE CROSS/BLUE SHIELD | Source: Ambulatory Visit | Attending: Nurse Practitioner | Admitting: Nurse Practitioner

## 2020-03-28 ENCOUNTER — Other Ambulatory Visit: Payer: Self-pay | Admitting: Nurse Practitioner

## 2020-03-28 NOTE — Telephone Encounter (Signed)
Spoke with the patient to inform her that, we have received a clearance for the hold for Plavix and ASA  x 7 days. I have faxed the clearnace over to scheduling to see if they can move her appointment back pass monday. I have also informed the patient Dr Lucky Cowboy office should give the ok when to restart back on the Plavix and ASA. The patient was understanding and agreeable

## 2020-03-28 NOTE — Telephone Encounter (Signed)
The patient will need to be COVID - 19 tested before her biposy. she can go between 8-1 on 04/03/2020 for the testing at [facility]. The patient was understanding.

## 2020-03-28 NOTE — Telephone Encounter (Signed)
Left a message To inform the patient that her bipsoy has been reschedule due to the Plavix and ASA. It has been schedule for 04/04/2020 @ 9:30 for a 10:30 appointment.

## 2020-03-28 NOTE — Telephone Encounter (Signed)
I called patient to inform her that Dr Lucky Cowboy needs to decide when to stop her Plavix and ASA, she states that they just called her about this and she is good to go

## 2020-03-28 NOTE — Progress Notes (Signed)
Patient on schedule for Lung Biopsy 04/04/2020, called and spoke with patient on phone with pre procedure instructions given. Made aware to hold Plavix after today, NPO after 0200 on 8/19, as well as hold am metformin/januvia, and have driver for discharge post procedure and recovery, stated understanding.

## 2020-03-29 ENCOUNTER — Telehealth: Payer: Self-pay

## 2020-03-29 LAB — VITAMIN D 25 HYDROXY (VIT D DEFICIENCY, FRACTURES): Vit D, 25-Hydroxy: 22 ng/mL — ABNORMAL LOW (ref 30.0–100.0)

## 2020-03-29 LAB — HEPATITIS C ANTIBODY: Hep C Virus Ab: <0.1 s/co ratio (ref 0.0–0.9)

## 2020-03-29 LAB — COMPREHENSIVE METABOLIC PANEL
ALT: 24 IU/L (ref 0–32)
AST: 31 IU/L (ref 0–40)
Albumin/Globulin Ratio: 1.6 (ref 1.2–2.2)
Albumin: 4.7 g/dL (ref 3.8–4.8)
Alkaline Phosphatase: 81 IU/L (ref 48–121)
BUN/Creatinine Ratio: 13 (ref 12–28)
BUN: 10 mg/dL (ref 8–27)
Bilirubin Total: 0.2 mg/dL (ref 0.0–1.2)
CO2: 23 mmol/L (ref 20–29)
Calcium: 9.4 mg/dL (ref 8.7–10.3)
Chloride: 101 mmol/L (ref 96–106)
Creatinine, Ser: 0.76 mg/dL (ref 0.57–1.00)
GFR calc Af Amer: 96 mL/min/{1.73_m2} (ref >59)
GFR calc non Af Amer: 83 mL/min/{1.73_m2} (ref >59)
Globulin, Total: 2.9 g/dL (ref 1.5–4.5)
Glucose: 140 mg/dL — ABNORMAL HIGH (ref 65–99)
Potassium: 4.1 mmol/L (ref 3.5–5.2)
Sodium: 137 mmol/L (ref 134–144)
Total Protein: 7.6 g/dL (ref 6.0–8.5)

## 2020-03-29 LAB — CBC
Hematocrit: 27.1 % — ABNORMAL LOW (ref 34.0–46.6)
Hemoglobin: 7.1 g/dL — ABNORMAL LOW (ref 11.1–15.9)
MCH: 18.3 pg — ABNORMAL LOW (ref 26.6–33.0)
MCHC: 26.2 g/dL — ABNORMAL LOW (ref 31.5–35.7)
MCV: 70 fL — ABNORMAL LOW (ref 79–97)
Platelets: 442 10*3/uL (ref 150–450)
RBC: 3.87 x10E6/uL (ref 3.77–5.28)
RDW: 18.6 % — ABNORMAL HIGH (ref 11.7–15.4)
WBC: 6 10*3/uL (ref 3.4–10.8)

## 2020-03-29 LAB — TSH: TSH: 0.379 u[IU]/mL — ABNORMAL LOW (ref 0.450–4.500)

## 2020-03-29 LAB — LIPID PANEL WITH LDL/HDL RATIO
Cholesterol, Total: 160 mg/dL (ref 100–199)
HDL: 66 mg/dL (ref >39)
LDL Chol Calc (NIH): 81 mg/dL (ref 0–99)
LDL/HDL Ratio: 1.2 ratio (ref 0.0–3.2)
Triglycerides: 68 mg/dL (ref 0–149)
VLDL Cholesterol Cal: 13 mg/dL (ref 5–40)

## 2020-03-29 LAB — T4, FREE: Free T4: 1.33 ng/dL (ref 0.82–1.77)

## 2020-03-29 NOTE — Telephone Encounter (Signed)
Confirmed and screened for 04-02-20 ov. 

## 2020-04-01 ENCOUNTER — Ambulatory Visit: Payer: BLUE CROSS/BLUE SHIELD

## 2020-04-02 ENCOUNTER — Ambulatory Visit: Payer: BLUE CROSS/BLUE SHIELD | Admitting: Nurse Practitioner

## 2020-04-02 ENCOUNTER — Other Ambulatory Visit: Payer: Self-pay

## 2020-04-02 ENCOUNTER — Encounter: Payer: Self-pay | Admitting: Nurse Practitioner

## 2020-04-02 VITALS — BP 140/80 | HR 99 | Temp 97.5°F | Resp 16 | Ht 59.0 in | Wt 111.0 lb

## 2020-04-02 DIAGNOSIS — C3431 Malignant neoplasm of lower lobe, right bronchus or lung: Secondary | ICD-10-CM

## 2020-04-02 DIAGNOSIS — D649 Anemia, unspecified: Secondary | ICD-10-CM | POA: Insufficient documentation

## 2020-04-02 DIAGNOSIS — R911 Solitary pulmonary nodule: Secondary | ICD-10-CM | POA: Insufficient documentation

## 2020-04-02 DIAGNOSIS — D509 Iron deficiency anemia, unspecified: Secondary | ICD-10-CM | POA: Insufficient documentation

## 2020-04-02 DIAGNOSIS — E1165 Type 2 diabetes mellitus with hyperglycemia: Secondary | ICD-10-CM | POA: Diagnosis not present

## 2020-04-02 DIAGNOSIS — F5089 Other specified eating disorder: Secondary | ICD-10-CM | POA: Diagnosis not present

## 2020-04-02 LAB — POCT GLYCOSYLATED HEMOGLOBIN (HGB A1C): Hemoglobin A1C: 5.7 % — AB (ref 4.0–5.6)

## 2020-04-02 NOTE — Progress Notes (Signed)
[facility] [place], [place] [address]  Internal MEDICINE  Office Visit Note  Patient Name: Angela Jensen  [id number]  [id number]  Date of Service: 04/02/2020  Chief Complaint  Patient presents with  . Follow-up  . Depression  . Diabetes  . Hyperlipidemia  . Hypertension  . Quality Metric Gaps    tetnaus     The patient is here for follow up visit. She states she has been having shortness of breath, back pain, and moderate fatigue. She states that she has had craving for ice over the last few weeks. She was recently diagnosed with recurrence of lung cancer.  Chest CT on 03/04/2020 had revealed enlarging and new pulmonary nodules suspicious for metastatic disease or metachronous primary. She is scheduled to have CT biopsy on 04/04/2020. Labs were done and resulted 04/01/2020 and show severe anemia which is new to the patient. Hgb was 7.1, down from 10.3 at most recent check. Her oxygen is 95% on room air. She denies dizziness or headache. Blood sugars and blood pressure are both well controlled. Her HgbA1c is 5.7 today.       Current Medication: Outpatient Encounter Medications as of 04/02/2020  Medication Sig  . Accu-Chek FastClix Lancets MISC Use as directed twice a day diag E11.65  . albuterol (PROVENTIL) (2.5 MG/3ML) 0.083% nebulizer solution Take 3 mLs (2.5 mg total) by nebulization every 6 (six) hours as needed for wheezing.  Marland Kitchen ALPRAZolam (XANAX) 0.25 MG tablet May take 1/2 to 1 tablet po one time one to two hours prior to flight. May repeat dose in one hour as needed for persistent anxiety. (Patient not taking: Reported on 03/11/2020)  . amLODipine (NORVASC) 10 MG tablet Take 1 tablet (10 mg total) by mouth every morning.  Marland Kitchen amoxicillin (AMOXIL) 875 MG tablet Take 1 tablet (875 mg total) by mouth 2 (two) times daily. (Patient not taking: Reported on 03/11/2020)  . aspirin EC 81 MG tablet Take 1 tablet (81 mg total) by mouth daily.  Marland Kitchen buPROPion (WELLBUTRIN  XL) 150 MG 24 hr tablet TAKE 1 TABLET BY MOUTH TWICE DAILY FOR SMOKING CESSATION  . clopidogrel (PLAVIX) 75 MG tablet Take 1 tablet by mouth once daily  . diclofenac Sodium (VOLTAREN) 1 % GEL Apply 4 g topically 4 (four) times daily. (Patient not taking: Reported on 03/11/2020)  . gabapentin (NEURONTIN) 100 MG capsule Take 1 capsule by mouth twice daily  . glucose blood (ACCU-CHEK AVIVA PLUS) test strip Use as instructed to check blood sugars three times daily.  E11.65  . JANUVIA 25 MG tablet Take 1 tablet by mouth once daily  . lisinopril-hydrochlorothiazide (ZESTORETIC) 10-12.5 MG tablet Take 1 tablet by mouth every morning.  . meloxicam (MOBIC) 7.5 MG tablet Take 1 tablet (7.5 mg total) by mouth 2 (two) times daily.  . metFORMIN (GLUCOPHAGE-XR) 500 MG 24 hr tablet Take 1 tablet (500 mg total) by mouth daily with breakfast.  . mometasone-formoterol (DULERA) 200-5 MCG/ACT AERO Inhale 1 puff into the lungs 2 (two) times daily. Please fill as 90 day prescription  . OXYGEN Inhale 2 L into the lungs at bedtime.  . potassium chloride (K-DUR) 10 MEQ tablet TAKE 1 TABLET BY MOUTH EVERY OTHER DAY FOR  LOW  POTASSIUM  . rOPINIRole (REQUIP) 0.5 MG tablet Take 1 tablet (0.5 mg total) by mouth 2 (two) times daily.  . rosuvastatin (CRESTOR) 10 MG tablet Take 1 tablet (10 mg total) by mouth at bedtime. (Patient not taking: Reported on  03/21/2020)  . SPIRIVA HANDIHALER 18 MCG inhalation capsule INHALE THE CONTENTS OF ONE CAPSULE ONCE DAILY.  . VENTOLIN HFA 108 (90 Base) MCG/ACT inhaler INHALE 2 PUFFS INTO LUNGS EVERY 4 HOURS AS NEEDED FOR WHEEZING OR SHORTNESS OF BREATH   No facility-administered encounter medications on file as of 04/02/2020.    Surgical History: Past Surgical History:  Procedure Laterality Date  . CESAREAN SECTION     x3  . COLONOSCOPY WITH PROPOFOL N/A 06/25/2015   Procedure: COLONOSCOPY WITH PROPOFOL;  Surgeon: Lucilla Lame, MD;  Location: [facility] END[facility];  Service: Endoscopy;  Laterality:  N/A;  . CYST REMOVAL LEG     and on shoulder   . LOWER EXTREMITY ANGIOGRAPHY Left 09/29/2018   Procedure: LOWER EXTREMITY ANGIOGRAPHY;  Surgeon: Algernon Huxley, MD;  Location: [place] [facility];  Service: Cardiovascular;  Laterality: Left;  . LUNG BIOPSY  05 15 2013   has lung "spots"  . PERIPHERAL VASCULAR CATHETERIZATION Left 06/01/2016   Procedure: Lower Extremity Angiography;  Surgeon: Algernon Huxley, MD;  Location: [place] [facility];  Service: Cardiovascular;  Laterality: Left;  . PERIPHERAL VASCULAR CATHETERIZATION N/A 06/01/2016   Procedure: Abdominal Aortogram w/Lower Extremity;  Surgeon: Algernon Huxley, MD;  Location: [place] [facility];  Service: Cardiovascular;  Laterality: N/A;  . PERIPHERAL VASCULAR CATHETERIZATION  06/01/2016   Procedure: Lower Extremity Intervention;  Surgeon: Algernon Huxley, MD;  Location: [place] [facility];  Service: Cardiovascular;;  . PERIPHERAL VASCULAR CATHETERIZATION Right 06/08/2016   Procedure: Lower Extremity Angiography;  Surgeon: Algernon Huxley, MD;  Location: [place] [facility];  Service: Cardiovascular;  Laterality: Right;  . PERIPHERAL VASCULAR CATHETERIZATION  06/08/2016   Procedure: Lower Extremity Intervention;  Surgeon: Algernon Huxley, MD;  Location: [place] [facility];  Service: Cardiovascular;;    Medical History: Past Medical History:  Diagnosis Date  . Arthritis   . Asthma   . Atherosclerosis of native arteries of extremity with intermittent claudication ([place]) 05/26/2016  . Cancer [facility]) 2012   Right Lung CA  . COPD (chronic obstructive pulmonary disease) ([place])   . Depression   . Diabetes mellitus without complication [facility])    Patient takes Janumet  . Essential hypertension 05/26/2016  . Hypercholesteremia   . Hypertension   . 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 PAD (peripheral artery disease) ([place]) 06/22/2016  . Peripheral vascular disease ([place])   . Personal history of radiation therapy   . Shortness of breath dyspnea    with  exertion   . Sleep apnea     Family History: Family History  Problem Relation Age of Onset  . Lung cancer Father   . Diabetes Mother   . Hypercholesterolemia Mother   . Diabetes Sister   . Diabetes Maternal Grandmother   . Diabetes Paternal Grandmother   . Diabetes Sister   . Heart attack Brother   . Coronary artery disease Brother   . Vascular Disease Brother   . Hypertension Sister   . Heart attack Brother     Social History   Socioeconomic History  . Marital status: Widowed    Spouse name: Not on file  . Number of children: Not on file  . Years of education: Not on file  . Highest education level: Not on file  Occupational History  . Not on file  Tobacco Use  . Smoking status: Former Smoker    Packs/day: 1.00    Years: 37.00  Pack years: 37.00    Types: Cigarettes    Quit date: 02/06/2010    Years since quitting: 10.1  . Smokeless tobacco: Former Systems developer    Types: Snuff  Vaping Use  . Vaping Use: Never used  Substance and Sexual Activity  . Alcohol use: Yes    Alcohol/week: 5.0 standard drinks    Types: 5 Cans of beer per week    Comment: /h x of alcohol abuse -stopped 2012- now drinks 5 beer per w  . Drug use: Not Currently    Types: Marijuana, "Crack" cocaine, Cocaine    Comment: hx of cocaine use- last use 2015; last use sat marijuana6/22/19,   . Sexual activity: Yes  Other Topics Concern  . Not on file  Social History Narrative  . Not on file   Social Determinants of Health   Financial Resource Strain:   . Difficulty of Paying Living Expenses:   Food Insecurity:   . Worried About Charity fundraiser in the Last Year:   . Arboriculturist in the Last Year:   Transportation Needs:   . Film/video editor (Medical):   Marland Kitchen Lack of Transportation (Non-Medical):   Physical Activity:   . Days of Exercise per Week:   . Minutes of Exercise per Session:   Stress:   . Feeling of Stress :   Social Connections:   . Frequency of Communication with  Friends and Family:   . Frequency of Social Gatherings with Friends and Family:   . Attends Religious Services:   . Active Member of Clubs or Organizations:   . Attends Archivist Meetings:   Marland Kitchen Marital Status:   Intimate Partner Violence:   . Fear of Current or Ex-Partner:   . Emotionally Abused:   Marland Kitchen Physically Abused:   . Sexually Abused:       Review of Systems  Constitutional: Positive for activity change and fatigue. Negative for chills and unexpected weight change.       Patient states that she has been feeling sluggish.   HENT: Negative for congestion, postnasal drip, rhinorrhea, sinus pressure, sneezing and sore throat.   Respiratory: Positive for shortness of breath and wheezing. Negative for cough and chest tightness.   Cardiovascular: Negative for chest pain and palpitations.  Gastrointestinal: Positive for constipation. Negative for abdominal pain, diarrhea, nausea and vomiting.  Endocrine: Negative for cold intolerance, heat intolerance, polydipsia and polyuria.       Blood sugars doing well   Musculoskeletal: Positive for arthralgias and back pain. Negative for joint swelling and neck pain.  Skin: Negative for rash.  Allergic/Immunologic: Positive for environmental allergies.  Neurological: Negative for dizziness, tremors, numbness and headaches.  Hematological: Negative for adenopathy. Does not bruise/bleed easily.       Craving for ice  Psychiatric/Behavioral: Negative for behavioral problems (Depression), sleep disturbance and suicidal ideas. The patient is not nervous/anxious.     Today's Vitals   04/02/20 1029  BP: 140/80  Pulse: 99  Resp: 16  Temp: (!) 97.5 F (36.4 C)  SpO2: 95%  Weight: 111 lb (50.3 kg)  Height: 4\' 11"  (1.499 m)   Body mass index is 22.42 kg/m.  Physical Exam Vitals and nursing note reviewed.  Constitutional:      General: She is not in acute distress.    Appearance: Normal appearance. She is well-developed. She is not  diaphoretic.  HENT:     Head: Normocephalic and atraumatic.     Nose: Nose normal.  Mouth/Throat:     Pharynx: No oropharyngeal exudate.  Eyes:     Pupils: Pupils are equal, round, and reactive to light.  Neck:     Thyroid: No thyromegaly.     Vascular: No JVD.     Trachea: No tracheal deviation.  Cardiovascular:     Rate and Rhythm: Normal rate and regular rhythm.     Heart sounds: Normal heart sounds. No murmur heard.  No friction rub. No gallop.   Pulmonary:     Effort: Pulmonary effort is normal. No respiratory distress.     Breath sounds: No wheezing or rales.     Comments: Breath sounds are clear, but diminished bilaterally.  Chest:     Chest wall: No tenderness.  Abdominal:     Palpations: Abdomen is soft.  Musculoskeletal:        General: Normal range of motion.     Cervical back: Normal range of motion and neck supple.  Lymphadenopathy:     Cervical: No cervical adenopathy.  Skin:    General: Skin is warm and dry.  Neurological:     Mental Status: She is alert and oriented to person, place, and time.     Cranial Nerves: No cranial nerve deficit.  Psychiatric:        Mood and Affect: Mood normal.        Behavior: Behavior normal.        Thought Content: Thought content normal.        Judgment: Judgment normal.    Assessment/Plan: 1. Iron deficiency anemia, unspecified iron deficiency anemia type Recent labs resulting 04/01/2020 show Hgb of 7.1, down from 10.3. Patient is symptomatic. Hospitalization recommended, as she is scheduled to have biopsy done later this week for recurrence of lung malignancy. Will discuss with patient's oncologist and plan to have her admitted 04/03/2020. The patient is aware of the plan and is in agreement with it.   2. Pica in adults Likely due to new onset of iron deficiency anemia.   3. Cancer of lower lobe of right lung [facility]) Patient scheduled to have CT biopsy of right lung mass.   4. Type 2 diabetes mellitus with  hyperglycemia, unspecified whether long term insulin use (HCC) - POCT HgB A1C 5.7 today. Continue all diabetic medication as prescribed .  General Counseling: Dajsha verbalizes understanding of the findings of todays visit and agrees with plan of treatment. I have discussed any further diagnostic evaluation that may be needed or ordered today. We also reviewed her medications today. she has been encouraged to call the office with any questions or concerns that should arise related to todays visit.    Orders Placed This Encounter  Procedures  . POCT HgB A1C    Total time spent: 45 Minutes   Time spent includes review of chart, medications, test results, and follow up plan with the patient.      Dr [name] Internal medicine

## 2020-04-03 ENCOUNTER — Other Ambulatory Visit
Admission: RE | Admit: 2020-04-03 | Discharge: 2020-04-03 | Disposition: A | Discharge status: Home / Self Care | Payer: BLUE CROSS/BLUE SHIELD | Source: Ambulatory Visit | Attending: Hematology and Oncology | Admitting: Hematology and Oncology

## 2020-04-03 ENCOUNTER — Other Ambulatory Visit: Payer: Self-pay | Admitting: Student

## 2020-04-03 DIAGNOSIS — Z20822 Contact with and (suspected) exposure to covid-19: Secondary | ICD-10-CM | POA: Diagnosis not present

## 2020-04-03 DIAGNOSIS — Z01812 Encounter for preprocedural laboratory examination: Secondary | ICD-10-CM | POA: Diagnosis not present

## 2020-04-04 ENCOUNTER — Ambulatory Visit
Admission: RE | Admit: 2020-04-04 | Discharge: 2020-04-04 | Disposition: A | Discharge status: Home / Self Care | Payer: BLUE CROSS/BLUE SHIELD | Source: Ambulatory Visit | Attending: Hematology and Oncology | Admitting: Hematology and Oncology

## 2020-04-04 ENCOUNTER — Ambulatory Visit
Admission: RE | Admit: 2020-04-04 | Discharge: 2020-04-04 | Disposition: A | Discharge status: Home / Self Care | Payer: BLUE CROSS/BLUE SHIELD | Source: Ambulatory Visit | Attending: Internal Medicine | Admitting: Internal Medicine

## 2020-04-04 ENCOUNTER — Telehealth: Payer: Self-pay | Admitting: Internal Medicine

## 2020-04-04 ENCOUNTER — Other Ambulatory Visit: Payer: Self-pay

## 2020-04-04 DIAGNOSIS — D649 Anemia, unspecified: Secondary | ICD-10-CM | POA: Insufficient documentation

## 2020-04-04 DIAGNOSIS — R911 Solitary pulmonary nodule: Secondary | ICD-10-CM | POA: Diagnosis not present

## 2020-04-04 LAB — CBC
HCT: 23.6 % — ABNORMAL LOW (ref 36.0–46.0)
HCT: 23.3 % — ABNORMAL LOW (ref 36.0–46.0)
Hemoglobin: 6.6 g/dL — ABNORMAL LOW (ref 12.0–15.0)
Hemoglobin: 6.5 g/dL — ABNORMAL LOW (ref 12.0–15.0)
MCH: 19 pg — ABNORMAL LOW (ref 26.0–34.0)
MCH: 19 pg — ABNORMAL LOW (ref 26.0–34.0)
MCHC: 28 g/dL — ABNORMAL LOW (ref 30.0–36.0)
MCHC: 27.9 g/dL — ABNORMAL LOW (ref 30.0–36.0)
MCV: 68 fL — ABNORMAL LOW (ref 80.0–100.0)
MCV: 67.9 fL — ABNORMAL LOW (ref 80.0–100.0)
Platelets: 345 10*3/uL (ref 150–400)
Platelets: 314 10*3/uL (ref 150–400)
RBC: 3.47 MIL/uL — ABNORMAL LOW (ref 3.87–5.11)
RBC: 3.43 MIL/uL — ABNORMAL LOW (ref 3.87–5.11)
RDW: 20.3 % — ABNORMAL HIGH (ref 11.5–15.5)
RDW: 20.3 % — ABNORMAL HIGH (ref 11.5–15.5)
WBC: 4.5 10*3/uL (ref 4.0–10.5)
WBC: 5.1 10*3/uL (ref 4.0–10.5)
nRBC: 0 % (ref 0.0–0.2)
nRBC: 0 % (ref 0.0–0.2)

## 2020-04-04 LAB — HEMOGLOBIN AND HEMATOCRIT, BLOOD
HCT: 31.3 % — ABNORMAL LOW (ref 36.0–46.0)
Hemoglobin: 9.2 g/dL — ABNORMAL LOW (ref 12.0–15.0)

## 2020-04-04 LAB — PROTIME-INR
INR: 0.9 (ref 0.8–1.2)
Prothrombin Time: 12.1 seconds (ref 11.4–15.2)

## 2020-04-04 LAB — SARS CORONAVIRUS 2 (TAT 6-24 HRS): SARS Coronavirus 2: NEGATIVE

## 2020-04-04 LAB — PREPARE RBC (CROSSMATCH)

## 2020-04-04 LAB — GLUCOSE, CAPILLARY: Glucose-Capillary: 87 mg/dL (ref 70–99)

## 2020-04-04 MED ORDER — SODIUM CHLORIDE FLUSH 0.9 % IV SOLN
INTRAVENOUS | Status: AC
  Filled 2020-04-04: qty 10

## 2020-04-04 MED ORDER — SODIUM CHLORIDE 0.9 % IV SOLN: INTRAVENOUS | Status: DC

## 2020-04-04 MED ORDER — FENTANYL CITRATE (PF) 100 MCG/2ML IJ SOLN
INTRAMUSCULAR | Status: AC
  Filled 2020-04-04: qty 4

## 2020-04-04 MED ORDER — FUROSEMIDE 10 MG/ML IJ SOLN
INTRAMUSCULAR | Status: AC
  Filled 2020-04-04: qty 2

## 2020-04-04 MED ORDER — FUROSEMIDE 10 MG/ML IJ SOLN
20.0000 mg | Freq: Once | INTRAMUSCULAR | Status: AC
  Administered 2020-04-04: 20 mg via INTRAVENOUS

## 2020-04-04 MED ORDER — MIDAZOLAM HCL 5 MG/5ML IJ SOLN
INTRAMUSCULAR | Status: AC
  Filled 2020-04-04: qty 5

## 2020-04-04 NOTE — Progress Notes (Signed)
CT lung biopsy scheduled for today has been cancelled per Dr. Jarvis Newcomer due to patient's abnormal labs. Dr. Mike Gip informed by Dr. Jarvis Newcomer.   Vaughan Sine, RN  [facility]

## 2020-04-04 NOTE — Telephone Encounter (Signed)
Patient is referred to same day surgery for type and cross 2units. PRBC transfuse each over 4 hours and give lasix 20mg  in between transfusions, spoke with Lissa in same day surgery. Dr Devona Konig

## 2020-04-05 LAB — BPAM RBC
Blood Product Expiration Date: 202109212359
Blood Product Expiration Date: 202109212359
ISSUE DATE / TIME: 202108191738
ISSUE DATE / TIME: 202108192352
Unit Type and Rh: 5100
Unit Type and Rh: 5100

## 2020-04-05 LAB — TYPE AND SCREEN
ABO/RH(D): O POS
Antibody Screen: NEGATIVE
Unit division: 0
Unit division: 0

## 2020-04-05 LAB — PREPARE RBC (CROSSMATCH)

## 2020-04-09 ENCOUNTER — Ambulatory Visit: Payer: BLUE CROSS/BLUE SHIELD | Admitting: Hematology and Oncology

## 2020-04-10 ENCOUNTER — Encounter: Payer: Self-pay | Admitting: Hospice and Palliative Medicine

## 2020-04-10 ENCOUNTER — Other Ambulatory Visit: Payer: Self-pay

## 2020-04-10 ENCOUNTER — Ambulatory Visit: Payer: BLUE CROSS/BLUE SHIELD | Admitting: Hospice and Palliative Medicine

## 2020-04-10 VITALS — BP 108/70 | HR 98 | Temp 98.1°F | Resp 16 | Ht 59.0 in | Wt 109.4 lb

## 2020-04-10 DIAGNOSIS — I10 Essential (primary) hypertension: Secondary | ICD-10-CM | POA: Diagnosis not present

## 2020-04-10 DIAGNOSIS — D649 Anemia, unspecified: Secondary | ICD-10-CM | POA: Diagnosis not present

## 2020-04-10 MED ORDER — POTASSIUM CHLORIDE ER 10 MEQ PO TBCR: EXTENDED_RELEASE_TABLET | ORAL | 3 refills | Status: DC

## 2020-04-10 NOTE — Progress Notes (Signed)
[facility] [place], [place] [address]  Internal MEDICINE  Office Visit Note  Patient Name: Angela Jensen  [id number]  [id number]  Date of Service: 04/10/2020  Chief Complaint  Patient presents with  . Follow-up  . Hypertension  . Hyperlipidemia  . Depression  . Diabetes  . Sleep Apnea  . Quality Metric Gaps    TDAP, HIV screening    HPI Patient here for routine follow-up Was transfused 2 units of PRBC for low hemoglobin US guided biopsy of lung mass cancelled due to low hemoglobin Scheduled to see oncologist, Dr. Cindee Lame tomorrow to discuss further plan for treatment Feeling much better after transfusion, more energy and appetite has improved  Current Medication: Outpatient Encounter Medications as of 04/10/2020  Medication Sig  . Accu-Chek FastClix Lancets MISC Use as directed twice a day diag E11.65  . albuterol (PROVENTIL) (2.5 MG/3ML) 0.083% nebulizer solution Take 3 mLs (2.5 mg total) by nebulization every 6 (six) hours as needed for wheezing.  Marland Kitchen amLODipine (NORVASC) 10 MG tablet Take 1 tablet (10 mg total) by mouth every morning.  Marland Kitchen amoxicillin (AMOXIL) 875 MG tablet Take 1 tablet (875 mg total) by mouth 2 (two) times daily.  Marland Kitchen aspirin EC 81 MG tablet Take 1 tablet (81 mg total) by mouth daily.  Marland Kitchen buPROPion (WELLBUTRIN XL) 150 MG 24 hr tablet TAKE 1 TABLET BY MOUTH TWICE DAILY FOR SMOKING CESSATION  . clopidogrel (PLAVIX) 75 MG tablet Take 1 tablet by mouth once daily  . gabapentin (NEURONTIN) 100 MG capsule Take 1 capsule by mouth twice daily  . glucose blood (ACCU-CHEK AVIVA PLUS) test strip Use as instructed to check blood sugars three times daily.  E11.65  . JANUVIA 25 MG tablet Take 1 tablet by mouth once daily  . lisinopril-hydrochlorothiazide (ZESTORETIC) 10-12.5 MG tablet Take 1 tablet by mouth every morning.  . meloxicam (MOBIC) 7.5 MG tablet Take 1 tablet (7.5 mg total) by mouth 2 (two) times daily.  . metFORMIN (GLUCOPHAGE-XR) 500  MG 24 hr tablet Take 1 tablet (500 mg total) by mouth daily with breakfast.  . mometasone-formoterol (DULERA) 200-5 MCG/ACT AERO Inhale 1 puff into the lungs 2 (two) times daily. Please fill as 90 day prescription  . OXYGEN Inhale 2 L into the lungs at bedtime.  Marland Kitchen rOPINIRole (REQUIP) 0.5 MG tablet Take 1 tablet (0.5 mg total) by mouth 2 (two) times daily.  . rosuvastatin (CRESTOR) 10 MG tablet Take 1 tablet (10 mg total) by mouth at bedtime.  Marland Kitchen SPIRIVA HANDIHALER 18 MCG inhalation capsule INHALE THE CONTENTS OF ONE CAPSULE ONCE DAILY.  . VENTOLIN HFA 108 (90 Base) MCG/ACT inhaler INHALE 2 PUFFS INTO LUNGS EVERY 4 HOURS AS NEEDED FOR WHEEZING OR SHORTNESS OF BREATH  . ALPRAZolam (XANAX) 0.25 MG tablet May take 1/2 to 1 tablet po one time one to two hours prior to flight. May repeat dose in one hour as needed for persistent anxiety. (Patient not taking: Reported on 04/10/2020)  . diclofenac Sodium (VOLTAREN) 1 % GEL Apply 4 g topically 4 (four) times daily. (Patient not taking: Reported on 04/10/2020)  . potassium chloride (KLOR-CON) 10 MEQ tablet TAKE 1 TABLET BY MOUTH EVERY OTHER DAY FOR  LOW  POTASSIUM  . [DISCONTINUED] potassium chloride (K-DUR) 10 MEQ tablet TAKE 1 TABLET BY MOUTH EVERY OTHER DAY FOR  LOW  POTASSIUM (Patient not taking: Reported on 04/10/2020)   No facility-administered encounter medications on file as of 04/10/2020.    Surgical History: Past  Surgical History:  Procedure Laterality Date  . CESAREAN SECTION     x3  . COLONOSCOPY WITH PROPOFOL N/A 06/25/2015   Procedure: COLONOSCOPY WITH PROPOFOL;  Surgeon: Lucilla Lame, MD;  Location: [facility] ENDOSCOPY;  Service: Endoscopy;  Laterality: N/A;  . CYST REMOVAL LEG     and on shoulder   . LOWER EXTREMITY ANGIOGRAPHY Left 09/29/2018   Procedure: LOWER EXTREMITY ANGIOGRAPHY;  Surgeon: Algernon Huxley, MD;  Location: [place] [facility];  Service: Cardiovascular;  Laterality: Left;  . LUNG BIOPSY  05 15 2013   has lung "spots"  .  PERIPHERAL VASCULAR CATHETERIZATION Left 06/01/2016   Procedure: Lower Extremity Angiography;  Surgeon: Algernon Huxley, MD;  Location: [place] [facility];  Service: Cardiovascular;  Laterality: Left;  . PERIPHERAL VASCULAR CATHETERIZATION N/A 06/01/2016   Procedure: Abdominal Aortogram w/Lower Extremity;  Surgeon: Algernon Huxley, MD;  Location: [place] [facility];  Service: Cardiovascular;  Laterality: N/A;  . PERIPHERAL VASCULAR CATHETERIZATION  06/01/2016   Procedure: Lower Extremity Intervention;  Surgeon: Algernon Huxley, MD;  Location: [place] [facility];  Service: Cardiovascular;;  . PERIPHERAL VASCULAR CATHETERIZATION Right 06/08/2016   Procedure: Lower Extremity Angiography;  Surgeon: Algernon Huxley, MD;  Location: [place] [facility];  Service: Cardiovascular;  Laterality: Right;  . PERIPHERAL VASCULAR CATHETERIZATION  06/08/2016   Procedure: Lower Extremity Intervention;  Surgeon: Algernon Huxley, MD;  Location: [place] [facility];  Service: Cardiovascular;;    Medical History: Past Medical History:  Diagnosis Date  . Arthritis   . Asthma   . Atherosclerosis of native arteries of extremity with intermittent claudication ([place]) 05/26/2016  . Cancer [facility]) 2012   Right Lung CA  . COPD (chronic obstructive pulmonary disease) ([place])   . Depression   . Diabetes mellitus without complication [facility])    Patient takes Janumet  . Essential hypertension 05/26/2016  . Hypercholesteremia   . Hypertension   . 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 PAD (peripheral artery disease) ([place]) 06/22/2016  . Peripheral vascular disease ([place])   . Personal history of radiation therapy   . Shortness of breath dyspnea    with exertion   . Sleep apnea     Family History: Family History  Problem Relation Age of Onset  . Lung cancer Father   . Diabetes Mother   . Hypercholesterolemia Mother   . Diabetes Sister   . Diabetes Maternal Grandmother   . Diabetes Paternal Grandmother   . Diabetes Sister   . Heart  attack Brother   . Coronary artery disease Brother   . Vascular Disease Brother   . Hypertension Sister   . Heart attack Brother     Social History   Socioeconomic History  . Marital status: Widowed    Spouse name: Not on file  . Number of children: Not on file  . Years of education: Not on file  . Highest education level: Not on file  Occupational History  . Not on file  Tobacco Use  . Smoking status: Former Smoker    Packs/day: 1.00    Years: 37.00    Pack years: 37.00    Types: Cigarettes    Quit date: 02/06/2010    Years since quitting: 10.1  . Smokeless tobacco: Former Systems developer    Types: Snuff  Vaping Use  . Vaping Use: Never used  Substance and Sexual Activity  . Alcohol use: Yes    Alcohol/week: 5.0 standard drinks  Types: 5 Cans of beer per week    Comment: /h x of alcohol abuse -stopped 2012- now drinks 5 beer per w  . Drug use: Not Currently    Types: Marijuana, "Crack" cocaine, Cocaine    Comment: hx of cocaine use- last use 2015; last use sat marijuana6/22/19,   . Sexual activity: Yes  Other Topics Concern  . Not on file  Social History Narrative  . Not on file   Social Determinants of Health   Financial Resource Strain:   . Difficulty of Paying Living Expenses: Not on file  Food Insecurity:   . Worried About Charity fundraiser in the Last Year: Not on file  . Ran Out of Food in the Last Year: Not on file  Transportation Needs:   . Lack of Transportation (Medical): Not on file  . Lack of Transportation (Non-Medical): Not on file  Physical Activity:   . Days of Exercise per Week: Not on file  . Minutes of Exercise per Session: Not on file  Stress:   . Feeling of Stress : Not on file  Social Connections:   . Frequency of Communication with Friends and Family: Not on file  . Frequency of Social Gatherings with Friends and Family: Not on file  . Attends Religious Services: Not on file  . Active Member of Clubs or Organizations: Not on file  .  Attends Archivist Meetings: Not on file  . Marital Status: Not on file  Intimate Partner Violence:   . Fear of Current or Ex-Partner: Not on file  . Emotionally Abused: Not on file  . Physically Abused: Not on file  . Sexually Abused: Not on file   Review of Systems  Constitutional: Negative for chills, diaphoresis and fatigue.  HENT: Negative for congestion, sinus pressure, sinus pain and sore throat.   Eyes: Negative for photophobia and visual disturbance.  Respiratory: Negative for cough, shortness of breath and wheezing.   Cardiovascular: Negative for chest pain, palpitations and leg swelling.  Gastrointestinal: Negative for abdominal pain, constipation, diarrhea, nausea and vomiting.  Genitourinary: Negative for dysuria and flank pain.  Musculoskeletal: Negative for arthralgias, back pain, gait problem and neck pain.  Skin: Negative for color change.  Allergic/Immunologic: Negative for environmental allergies and food allergies.  Neurological: Negative for dizziness and headaches.  Hematological: Does not bruise/bleed easily.  Psychiatric/Behavioral: Negative for agitation, behavioral problems (depression) and hallucinations.    Vital Signs: BP 108/70   Pulse 98   Temp 98.1 F (36.7 C)   Resp 16   Ht 4\' 11"  (1.499 m)   Wt 109 lb 6.4 oz (49.6 kg)   SpO2 91%   BMI 22.10 kg/m    Physical Exam Vitals reviewed.  Constitutional:      Appearance: Normal appearance. She is normal weight.  HENT:     Mouth/Throat:     Mouth: Mucous membranes are moist.     Pharynx: Oropharynx is clear.  Cardiovascular:     Rate and Rhythm: Normal rate and regular rhythm.     Pulses: Normal pulses.     Heart sounds: Normal heart sounds.  Pulmonary:     Effort: Pulmonary effort is normal.     Breath sounds: Normal breath sounds.  Abdominal:     General: Abdomen is flat.     Palpations: Abdomen is soft.  Musculoskeletal:        General: Normal range of motion.     Cervical  back: Normal range of motion.  Skin:    General: Skin is warm.  Neurological:     General: No focal deficit present.     Mental Status: She is alert and oriented to person, place, and time. Mental status is at baseline.  Psychiatric:        Mood and Affect: Mood normal.        Behavior: Behavior normal.        Thought Content: Thought content normal.    Assessment/Plan: 1. Anemia, unspecified type Required 2 units PRBC for hgb of 6.5, post transfusion hgb 9.2. Will assess anemia panel for possible iron deficiency. Spoke with Dr. Cindee Lame about approval to proceed with rescheduling US guided biopsy. - Vitamin B12 - Folate - Iron and TIBC - Ferritin - Retic  2. Essential hypertension Bp stable today. Will need to continue with potassium replacement while taking HCTZ for BP. Hypokalemia in the past after being started on HCTZ. Last potassium level 4.1. - potassium chloride (KLOR-CON) 10 MEQ tablet; TAKE 1 TABLET BY MOUTH EVERY OTHER DAY FOR  LOW  POTASSIUM  Dispense: 45 tablet; Refill: 3  General Counseling: Tonnie verbalizes understanding of the findings of todays visit and agrees with plan of treatment. I have discussed any further diagnostic evaluation that may be needed or ordered today. We also reviewed her medications today. she has been encouraged to call the office with any questions or concerns that should arise related to todays visit.    Orders Placed This Encounter  Procedures  . Vitamin B12  . Folate  . Iron and TIBC  . Ferritin  . Retic    Meds ordered this encounter  Medications  . potassium chloride (KLOR-CON) 10 MEQ tablet    Sig: TAKE 1 TABLET BY MOUTH EVERY OTHER DAY FOR  LOW  POTASSIUM    Dispense:  45 tablet    Refill:  3    Please consider 90 day supplies to promote better adherence    Time spent:25 Minutes  Time spent includes review of chart, medications, test results and follow-up plan with the patient. This patient was seen by Theodoro Grist  AGNP-C in Collaboration with Dr [name] as a part of collaborative care agreement     Tanna Furry. Lynnae Ludemann AGNP-C Internal medicine

## 2020-04-10 NOTE — Progress Notes (Signed)
[facility]  [address], [address] [place], [place] [address] Phone: [phone]  Fax: [phone]   Clinic Day:  04/11/2020  Referring physician: Ronnell Freshwater, NP  Chief Complaint: Angela Jensen is a 64 y.o. female with a history of stage I lung cancer s/p radiationand iron deficiency anemia who is seen for reassessment.  HPI: The patient was last seen in the medical oncology clinic on 03/21/2020. At that time, she was doing well.  She noted intermittent back pain. She was scheduled for biopsy of the RLL.  The patient saw Leretha Pol NP on 04/02/2020 for shortness of breath, back pain, fatigue, and ice pica. Labs revealed iron deficiency anemia with a hemoglobin of 7.1 and an MCV of 70 (previous hemoglobin 10.5 and MCV 85.7 on 07/21/2019). The plan was to have her admitted to the hospital for transfusion.  CT guided lung biopsy was cancelled secondary to anemia.  She received 2 units of PRBCs on 04/04/2020 in same day surgery.  Pre-transfusion hemoglobin was 6.5; post transfusion hemoglobin was 9.2.  During the interim, she has been "alright." She felt much better after her transfusion. She reports left-sided pleuritic pain (8/10) that began this morning. She denies any trauma or injury to the area. Her nose is congested so she thinks she is coming down with a cold. She reports shortness of breath with exertion. She had a cramp in her left leg yesterday morning that went away quickly.  She denies fever, headaches, vision changes, leg swelling, blood in the stools, melena, hemoptysis, vaginal bleeding, and hematuria. The patient eats a wide variety of iron-rich foods. She has been off of Plavix and aspirin for 15 days.  The patient states that she was told to wait to see GI until after her biopsy.  Last colonoscopy on 06/25/2015 by Dr. Allen Norris revealed one 4 mm polyp (tubular adenoma) in the sigmoid colon. There were non-bleeding internal hemorrhoids.  The  patient was not put on oral iron. The patient took oral iron about 47 years ago but does not anymore.   Past Medical History:  Diagnosis Date  . Arthritis   . Asthma   . Atherosclerosis of native arteries of extremity with intermittent claudication ([place]) 05/26/2016  . Cancer [facility]) 2012   Right Lung CA  . COPD (chronic obstructive pulmonary disease) ([place])   . Depression   . Diabetes mellitus without complication [facility])    Patient takes Janumet  . Essential hypertension 05/26/2016  . Hypercholesteremia   . Hypertension   . 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 PAD (peripheral artery disease) ([place]) 06/22/2016  . Peripheral vascular disease ([place])   . Personal history of radiation therapy   . Shortness of breath dyspnea    with exertion   . Sleep apnea     Past Surgical History:  Procedure Laterality Date  . CESAREAN SECTION     x3  . COLONOSCOPY WITH PROPOFOL N/A 06/25/2015   Procedure: COLONOSCOPY WITH PROPOFOL;  Surgeon: Lucilla Lame, MD;  Location: [facility] ENDOSCOPY;  Service: Endoscopy;  Laterality: N/A;  . CYST REMOVAL LEG     and on shoulder   . LOWER EXTREMITY ANGIOGRAPHY Left 09/29/2018   Procedure: LOWER EXTREMITY ANGIOGRAPHY;  Surgeon: Algernon Huxley, MD;  Location: [place] CV LAB;  Service: Cardiovascular;  Laterality: Left;  . LUNG BIOPSY  05 15 2013   has lung "spots"  . PERIPHERAL VASCULAR CATHETERIZATION Left 06/01/2016   Procedure: Lower Extremity  Angiography;  Surgeon: Algernon Huxley, MD;  Location: [place] CV [facility];  Service: Cardiovascular;  Laterality: Left;  . PERIPHERAL VASCULAR CATHETERIZATION N/A 06/01/2016   Procedure: Abdominal Aortogram w/Lower Extremity;  Surgeon: Algernon Huxley, MD;  Location: [place] CV LAB;  Service: Cardiovascular;  Laterality: N/A;  . PERIPHERAL VASCULAR CATHETERIZATION  06/01/2016   Procedure: Lower Extremity Intervention;  Surgeon: Algernon Huxley, MD;  Location: [place] CV LAB;  Service: Cardiovascular;;  . PERIPHERAL  VASCULAR CATHETERIZATION Right 06/08/2016   Procedure: Lower Extremity Angiography;  Surgeon: Algernon Huxley, MD;  Location: [place] [facility] LAB;  Service: Cardiovascular;  Laterality: Right;  . PERIPHERAL VASCULAR CATHETERIZATION  06/08/2016   Procedure: Lower Extremity Intervention;  Surgeon: Algernon Huxley, MD;  Location: [place] CV LAB;  Service: Cardiovascular;;    Family History  Problem Relation Age of Onset  . Lung cancer Father   . Diabetes Mother   . Hypercholesterolemia Mother   . Diabetes Sister   . Diabetes Maternal Grandmother   . Diabetes Paternal Grandmother   . Diabetes Sister   . Heart attack Brother   . Coronary artery disease Brother   . Vascular Disease Brother   . Hypertension Sister   . Heart attack Brother     Social History:  reports that she quit smoking about 10 years ago. Her smoking use included cigarettes. She has a 37.00 pack-year smoking history. She has quit using smokeless tobacco.  Her smokeless tobacco use included snuff. She reports current alcohol use of about 5.0 standard drinks of alcohol per week. She reports previous drug use. Drugs: Marijuana, "Crack" cocaine, and Cocaine. She has not smoked for 1 month. She started smoking at 17 until she was "57 something". Her boyfriend is [name]: [phone]. She lives in [place]. The patient is alone today.  Allergies: No Known Allergies  Current Medications: Current Outpatient Medications  Medication Sig Dispense Refill  . Accu-Chek FastClix Lancets MISC Use as directed twice a day diag E11.65 100 each 1  . albuterol (PROVENTIL) (2.5 MG/3ML) 0.083% nebulizer solution Take 3 mLs (2.5 mg total) by nebulization every 6 (six) hours as needed for wheezing. 75 mL 3  . amLODipine (NORVASC) 10 MG tablet Take 1 tablet (10 mg total) by mouth every morning. 30 tablet 5  . amoxicillin (AMOXIL) 875 MG tablet Take 1 tablet (875 mg total) by mouth 2 (two) times daily. 20 tablet 0  . aspirin EC 81 MG  tablet Take 1 tablet (81 mg total) by mouth daily. 150 tablet 2  . buPROPion (WELLBUTRIN XL) 150 MG 24 hr tablet TAKE 1 TABLET BY MOUTH TWICE DAILY FOR SMOKING CESSATION 60 tablet 6  . clopidogrel (PLAVIX) 75 MG tablet Take 1 tablet by mouth once daily 90 tablet 0  . gabapentin (NEURONTIN) 100 MG capsule Take 1 capsule by mouth twice daily 60 capsule 3  . glucose blood (ACCU-CHEK AVIVA PLUS) test strip Use as instructed to check blood sugars three times daily.  E11.65 250 each 5  . JANUVIA 25 MG tablet Take 1 tablet by mouth once daily 90 tablet 0  . lisinopril-hydrochlorothiazide (ZESTORETIC) 10-12.5 MG tablet Take 1 tablet by mouth every morning. 30 tablet 5  . meloxicam (MOBIC) 7.5 MG tablet Take 1 tablet (7.5 mg total) by mouth 2 (two) times daily. 60 tablet 3  . metFORMIN (GLUCOPHAGE-XR) 500 MG 24 hr tablet Take 1 tablet (500 mg total) by mouth daily with breakfast. 30 tablet 3  . mometasone-formoterol (  DULERA) 200-5 MCG/ACT AERO Inhale 1 puff into the lungs 2 (two) times daily. Please fill as 90 day prescription 39 g 1  . OXYGEN Inhale 2 L into the lungs at bedtime.    . potassium chloride (KLOR-CON) 10 MEQ tablet TAKE 1 TABLET BY MOUTH EVERY OTHER DAY FOR  LOW  POTASSIUM 45 tablet 3  . rOPINIRole (REQUIP) 0.5 MG tablet Take 1 tablet (0.5 mg total) by mouth 2 (two) times daily. 180 tablet 0  . rosuvastatin (CRESTOR) 10 MG tablet Take 1 tablet (10 mg total) by mouth at bedtime. 30 tablet 5  . SPIRIVA HANDIHALER 18 MCG inhalation capsule INHALE THE CONTENTS OF ONE CAPSULE ONCE DAILY. 90 capsule 0  . VENTOLIN HFA 108 (90 Base) MCG/ACT inhaler INHALE 2 PUFFS INTO LUNGS EVERY 4 HOURS AS NEEDED FOR WHEEZING OR SHORTNESS OF BREATH 18 g 3  . ALPRAZolam (XANAX) 0.25 MG tablet May take 1/2 to 1 tablet po one time one to two hours prior to flight. May repeat dose in one hour as needed for persistent anxiety. (Patient not taking: Reported on 04/10/2020) 10 tablet 0  . diclofenac Sodium (VOLTAREN) 1 % GEL  Apply 4 g topically 4 (four) times daily. (Patient not taking: Reported on 04/10/2020) 100 g 2   No current facility-administered medications for this visit.    Review of Systems  Constitutional: Negative.  Negative for chills, fever, malaise/fatigue and weight loss (up 1 lb).  HENT: Positive for congestion. Negative for ear discharge, ear pain, hearing loss, nosebleeds, sinus pain, sore throat and tinnitus.   Eyes: Negative.  Negative for blurred vision, double vision and photophobia.  Respiratory: Positive for shortness of breath (on exertion). Negative for cough, hemoptysis and sputum production.   Cardiovascular: Positive for chest pain (left-sided pleuritic pain). Negative for palpitations, orthopnea, leg swelling and PND.  Gastrointestinal: Negative.  Negative for abdominal pain, blood in stool, constipation, diarrhea, heartburn, melena, nausea and vomiting.       Eats a variety of iron-rich foods.  Genitourinary: Negative.  Negative for dysuria, frequency, hematuria and urgency.  Musculoskeletal: Negative for back pain (comes and goes), falls, joint pain, myalgias and neck pain.       Left leg cramp yesterday morning, went away quickly.  Skin: Negative.  Negative for itching and rash.  Neurological: Negative.  Negative for dizziness, tingling, sensory change, speech change, focal weakness, weakness and headaches.  Endo/Heme/Allergies: Negative.  Does not bruise/bleed easily.       Diabetes.  Psychiatric/Behavioral: Negative.  Negative for depression and memory loss. The patient is not nervous/anxious and does not have insomnia.   All other systems reviewed and are negative.   Performance status (ECOG): 1  Vitals:  Blood pressure (!) 141/77, pulse 88, temperature 98 F (36.7 C), temperature source Tympanic, weight 109 lb 14.4 oz (49.9 kg), SpO2 96 %.   Physical Exam Vitals and nursing note reviewed.  Constitutional:      General: She is not in acute distress.    Appearance: She  is not diaphoretic.  HENT:     Head: Normocephalic and atraumatic.     Comments: Dark hair pulled back.    Mouth/Throat:     Mouth: Mucous membranes are moist.     Pharynx: Oropharynx is clear.  Eyes:     General: No scleral icterus.    Extraocular Movements: Extraocular movements intact.     Conjunctiva/sclera: Conjunctivae normal.     Pupils: Pupils are equal, round, and reactive to light.  Comments: Glasses.  Brown eyes.  Cardiovascular:     Rate and Rhythm: Normal rate and regular rhythm.     Heart sounds: Normal heart sounds. No murmur heard.   Pulmonary:     Effort: Pulmonary effort is normal. No respiratory distress.     Breath sounds: Normal breath sounds. No wheezing or rales.  Chest:     Chest wall: No tenderness.  Abdominal:     General: Bowel sounds are normal. There is no distension.     Palpations: Abdomen is soft. There is no hepatomegaly, splenomegaly or mass.     Tenderness: There is no abdominal tenderness. There is no guarding or rebound.  Musculoskeletal:        General: No swelling or tenderness (left lower leg). Normal range of motion.     Cervical back: Normal range of motion and neck supple.  Lymphadenopathy:     Head:     Right side of head: No preauricular, posterior auricular or occipital adenopathy.     Left side of head: No preauricular, posterior auricular or occipital adenopathy.     Cervical: No cervical adenopathy.     Upper Body:     Right upper body: No supraclavicular or axillary adenopathy.     Left upper body: No supraclavicular or axillary adenopathy.     Lower Body: No right inguinal adenopathy. No left inguinal adenopathy.  Skin:    General: Skin is warm and dry.  Neurological:     Mental Status: She is alert and oriented to person, place, and time.  Psychiatric:        Behavior: Behavior normal.        Thought Content: Thought content normal.        Judgment: Judgment normal.    Imaging studies: 07/31/2011:  Chest CT  revealed a 1.1 x 0.6 cm RUL nodule and a 9 mm inferior aspect RUL nodule and a 4 mm nodule in the anterior aspect of the inferior aspect of the RUL. 08/03/2011:  PET scan revealed a 10-mm spiculated right apical pulmonary nodule (SUV 2.5) suspcious for lung cancer. Left ovary was prominent (3.9 x 2.4 cm).  01/30/2015:  PET scanrevealed a hypermetabolic and enlarging RUL pulmonary nodules concerning for lung cancer recurrence. There was a small focus of peripheral nodular consolidation in the superior segment of the right lower lobe with differential including inflammation/infection versus neoplasm. There was no evidence of mediastinal nodal metastasis.  03/27/2016:  PET scanrevealed no hypermetabolic activity to suggest recurrent malignancy. There was a soft tissue density in the left pelvis near the inguinal ring that was not hypermetabolic, but prominent and probably separate from the left ovary. This could be a wandering fibroid. Although the overall appearance had not changed from 2012 and was not hypermetabolic, pelvic sonography or pelvic MRI for further characterization of the uterus and ovaries can be considered. 08/01/2015:  Chest CTrevealed complete resolution of the prior nodule in the right lung apex.  01/31/2016:  Chest CTrevealed no active cardiopulmonary abnormalities. There was stable appearance of right apical and right middle lobe scar like densities.  02/02/2017:  Low dose chest CTrevealed new ground-glass nodule in the right lower lobe. Lung-RADS 3 (probably benign findings).  07/29/2017:  Low dose chest CT revealed enlargement of the previously observed sub-solid pulmonary lesion. RIGHT upper lobe lesion measured 1.4 x 2.1 x 1.6 cm (previously 1.5 x 1.1 x 1.7 cm). There was also areas of focal architectural distortion in the RIGHT upper and middle lobes that  were felt to be most compatible with areas of chronic post infectious or inflammatory scarring. Aortic value  calcifications were noted. Lung-RADS 4BS (suspicious).  01/19/2018:  Chest CTrevealed a subsolid 2.1 cm peripheral right lower lobe pulmonary nodule. Although not appreciably changed in size, subjectively this nodule had increased in density, and remains suspicious for primary bronchogenic adenocarcinoma. There was no thoracic adenopathy. 07/18/2018:  Chest CT revealed early postradiation change in the peripheral right lower lobe at the site of the treated tumor, with no residual measurable pulmonary nodule in this location.There were no findings of metastatic disease in the chest. 01/17/2019:  Chest CT without contrast revealed decreased subpleural reticulation at the SBRT site in the RIGHT lower lobe consistent with improved radiation change. There was residual nodularity along the fissure at this site with recommendation for close attention on follow-up. There was stable pulmonary scarring in the right upper lobe and right middle lobe. There was centrilobular emphysema. 07/19/2019:  Chest CT revealed a 6 x 6 mm nodule in the right lower lobe had increased in size from 3 mm previously. The nodule was separate and discrete from the previous treatment beds. Metachronous or metastatic disease a concern. Close CT follow up was recommended. There was a 5 mm right lower lobe pulmonary nodule new in the interval. Close attention on follow up was recommended. There was stable appearance of parenchymal scarring in the right lung period.  03/04/2020:  Chest CT revealed enlarging and new pulmonary nodules suspicious for metastatic disease or metachronous primary.  03/19/2020:  PET scan revealed that the right lower lobe pulmonary nodules did not demonstrate any hypermetabolism to suggest neoplasm. However, these were somewhat worrisome; recommendation was close CT surveillance. There was no mediastinal or hilar lymphadenopathy. There were stable right lung scarring changes and significant underlying  emphysema.   No visits with results within 3 Day(s) from this visit.  Latest known visit with results is:  Hospital Outpatient Visit on 04/04/2020  Component Date Value Ref Range Status  . ABO/RH(D) 04/04/2020 O POS   Final  . Antibody Screen 04/04/2020 NEG   Final  . Sample Expiration 04/04/2020 04/07/2020,2359   Final  . Unit Number 04/04/2020 [id number]   Final  . Blood Component Type 04/04/2020 RED CELLS,LR   Final  . Unit division 04/04/2020 00   Final  . Status of Unit 04/04/2020 ISSUED,FINAL   Final  . Transfusion Status 04/04/2020 OK TO TRANSFUSE   Final  . Crossmatch Result 04/04/2020    Final                   Value:Compatible Performed at [facility], [address], [place], [place] [address]   . Unit Number 04/04/2020 [id number]   Final  . Blood Component Type 04/04/2020 RED CELLS,LR   Final  . Unit division 04/04/2020 00   Final  . Status of Unit 04/04/2020 ISSUED,FINAL   Final  . Transfusion Status 04/04/2020 OK TO TRANSFUSE   Final  . Crossmatch Result 04/04/2020 Compatible   Final  . Order Confirmation 04/04/2020    Final                   Value:ORDER PROCESSED BY BLOOD BANK Performed at [facility], [address], [place], [place] [address]   . WBC 04/04/2020 5.1  4.0 - 10.5 K/uL Final  . RBC 04/04/2020 3.43* 3.87 - 5.11 MIL/uL Final  . Hemoglobin 04/04/2020 6.5* 12.0 - 15.0 g/dL Final   Comment: Reticulocyte Hemoglobin  testing may be clinically indicated, consider ordering this additional test [id number]   . HCT 04/04/2020 23.3* 36 - 46 % Final  . MCV 04/04/2020 67.9* 80.0 - 100.0 fL Final  . MCH 04/04/2020 19.0* 26.0 - 34.0 pg Final  . MCHC 04/04/2020 27.9* 30.0 - 36.0 g/dL Final  . RDW 04/04/2020 20.3* 11.5 - 15.5 % Final  . Platelets 04/04/2020 314  150 - 400 K/uL Final  . nRBC 04/04/2020 0.0  0.0 - 0.2 % Final   Performed at [facility], [address] [place], [place] [address]  . ISSUE DATE / TIME 04/04/2020  [id number]   Final  . Blood Product Unit Number 04/04/2020 [id number]   Final  . PRODUCT CODE 04/04/2020 [id number]   Final  . Unit Type and Rh 04/04/2020 5100   Final  . Blood Product Expiration Date 04/04/2020 [id number]   Final  . ISSUE DATE / TIME 04/04/2020 [id number]   Final  . Blood Product Unit Number 04/04/2020 [id number]   Final  . PRODUCT CODE 04/04/2020 [id number]   Final  . Unit Type and Rh 04/04/2020 5100   Final  . Blood Product Expiration Date 04/04/2020 [id number]   Final  . Hemoglobin 04/04/2020 9.2* 12.0 - 15.0 g/dL Final   Comment: REPEATED TO VERIFY POST TRANSFUSION SPECIMEN   . HCT 04/04/2020 31.3* 36 - 46 % Final   Performed at [facility], [address] [place], [place] [address]  . Order Confirmation 04/04/2020    Final                   Value:ORDER PROCESSED BY BLOOD BANK Performed at [facility], [address] [place], [place] [address]     Assessment:  Angela Jensen is a 64 y.o. 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 She was not a good surgery candidate secondary to her lung function (FEV1 28%).  PET scan on 08/03/2011 revealed a 10-mm spiculated right apical pulmonary nodule (SUV 2.5) suspcious for lung cancer. Left ovary was prominent (3.9 x 2.4 cm). CA-125 was 9.3 (normal).  CT guided biopsy of the superior and inferior RUL noduleson 12/30/2011 revealed well differentiated adenocarcinoma of lung primary. EGFR testing revealed no mutation. She received IMRT 6000 cGyto both nodules from 01/26/2012 - 03/10/2012.  PET scanon 01/30/2015 revealed a hypermetabolic and enlarging RUL pulmonary nodules concerning for lung cancer recurrence. There was a small focus of peripheral nodular consolidation in the superior segment of the right lower lobe with differential including inflammation/infection versus neoplasm. There was no evidence of mediastinal nodal metastasis.    In 04/2015, there was an attempt at radiofrequency ablation of 1 enlarging RUL nodule. At the time of the procedure, the nodule had shrunk to 5 mm from 9 mm.  Chest CTon 01/19/2018 revealed a subsolid 2.1 cm peripheral right lower lobe pulmonary nodule. Although not appreciably changed in size, subjectively this nodule had increased in density, and remains suspicious for primary bronchogenic adenocarcinoma. There was no thoracic adenopathy.  CT guided RLL biopsyon 02/09/2018 revealed adenocarcinoma of lung origin, lepidic and papillary patterns.  She received6000 cGy in 5 fractions toRLL nodulefrom 03/22/2018 - 04/05/2018.  Chest CT on 03/04/2020 revealed enlarging and new pulmonary nodules suspicious for metastatic disease or metachronous primary.  PET scan on 03/19/2020 revealed that the right lower lobe pulmonary nodules did not demonstrate any hypermetabolism to suggest neoplasm. However, these were somewhat worrisome; recommendation was close CT surveillance. There was  no mediastinal or hilar lymphadenopathy.   She has an elevated CEAof unclear etiology. CEA was 8.1 on 01/31/2016, 9.2 on 03/03/2016, 7.9 on 10/12/2017, and 7.9 on 01/19/2019.  Colonoscopyon 06/25/2015 revealed a 4 mm polyp in the sigmoid colon. Pathology revealed a tubular adenoma without evidence of dysplasia or malignancy.  Pelvic MRIon 04/30/2016 revealed a 3.4 x 2.5 cm ovoid mass in the LEFT adnexa with signal characteristics identical to the intramural leiomyomas and consistent with an exophytic leiomyoma. 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 Endometrium was normal with a normal junctional zone. Ovaries were not identified.  She has a history of an elevated total protein.  Protein was 8.3 (6.5-8.1) on 01/19/2019.  M-spike and FLCA were normal.  She has iron deficiency anemia.  She required 2 units of PRBCs on 04/04/2020.  Colonoscopy on 06/25/2015 revealed one 4 mm polyp (tubular adenoma) in  the sigmoid colon. There were non-bleeding internal hemorrhoids.  The patient received the [place] COVID-19 vaccine on 11/20/2019 and 12/11/2019.  Symptomatically, she notes left sided pleuritic pain and recent calf tenderness.  Exam reveals no palpable cords or Homan's sign.  Hemoglobin is 11.4.  D-dimers are 1043.59 (high).  Plan: 1.   Labs today:  CBC with diff, BMP, ferritin, iron studies, retic, D-dimers. 2.   Stage I adenocarcinoma RUL Patient is s/p IMRT (completed 03/10/2012). Chest CT from 03/04/2020 revealed new and enlarging pulmonary nodules.  PET scan on 03/19/2020 revealed RLL pulmonary nodules are 10 mm and 7 mm.   Nodules have increased in size, but have no hypermetabolism.   Etiology may be a low grade adenocarcinoma.  Reschedule lung biopsy.  Discuss plans to continue to hold Plavix and aspirin if biopsy in < 1 week. 3.RLL adenocarcinoma Patient is s/p SBRT (completed 04/05/2018).  PET scan reveals enlarging RLL nodule.  Anticipate biopsy of larger nodule. 4.   Pleuritic chest pain and elevated D-dimer  Chest CT angiogram STAT today.  Patient to follow-up in clinic after imaging. 5.   Iron deficiency anemia  Patient is s/p 2 units of PRBCs on 04/04/2020.  Patient denies any bleeding.  Discuss initiation of oral iron.  Preauth Venofer if needed.  Patient agrees to follow-up with GI after lung biopsy. 6.   Reschedule lung biopsy. 7.   RTC 3 days after lung biopsy for MD assessment, review of pathology and discussion regarding direction of therapy.  Addendum:  Chest CT angiogram today revealed no definite evidence of pulmonary embolus.  There were stable nodular densities are noted in the right lower lobe concerning for metastatic disease.  There was stable scarring or fibrosis is noted in the right middle lobe.  I discussed the assessment and treatment plan with the patient.  The patient was provided an opportunity to  ask questions and all were answered.  The patient agreed with the plan and demonstrated an understanding of the instructions.  The patient was advised to call back or seek an in person evaluation if the symptoms worsen or if the condition fails to improve as anticipated.  I provided 17 minutes of face-to-face time during this this encounter and > 50% was spent counseling as documented under my assessment and plan.  An additional 10 minutes were spent reviewing her chart (Epic and Care Everywhere) including notes, labs, and imaging studies. I spoke to the patient after her CT angiogram, reviewed the results, and reviewed plans for biopsy on 04/24/2020.   Nolon Stalls, MD, PhD  04/11/2020, 8:58 AM  I, Mirian Mo Tufford, am  acting as Education administrator for [employer]. Mike Gip, MD, PhD.  I, Fadel Clason C. Mike Gip, MD, have reviewed the above documentation for accuracy and completeness, and I agree with the above.

## 2020-04-11 ENCOUNTER — Telehealth: Payer: Self-pay

## 2020-04-11 ENCOUNTER — Other Ambulatory Visit: Payer: Self-pay

## 2020-04-11 ENCOUNTER — Ambulatory Visit
Admission: RE | Admit: 2020-04-11 | Discharge: 2020-04-11 | Disposition: A | Discharge status: Home / Self Care | Payer: BLUE CROSS/BLUE SHIELD | Source: Ambulatory Visit | Attending: Hematology and Oncology | Admitting: Hematology and Oncology

## 2020-04-11 ENCOUNTER — Inpatient Hospital Stay: Payer: BLUE CROSS/BLUE SHIELD

## 2020-04-11 ENCOUNTER — Encounter: Payer: Self-pay | Admitting: Hematology and Oncology

## 2020-04-11 ENCOUNTER — Inpatient Hospital Stay (HOSPITAL_BASED_OUTPATIENT_CLINIC_OR_DEPARTMENT_OTHER): Payer: BLUE CROSS/BLUE SHIELD | Admitting: Hematology and Oncology

## 2020-04-11 VITALS — BP 141/77 | HR 88 | Temp 98.0°F | Wt 109.9 lb

## 2020-04-11 DIAGNOSIS — C3431 Malignant neoplasm of lower lobe, right bronchus or lung: Secondary | ICD-10-CM | POA: Diagnosis not present

## 2020-04-11 DIAGNOSIS — D509 Iron deficiency anemia, unspecified: Secondary | ICD-10-CM | POA: Diagnosis not present

## 2020-04-11 DIAGNOSIS — C3411 Malignant neoplasm of upper lobe, right bronchus or lung: Secondary | ICD-10-CM | POA: Diagnosis not present

## 2020-04-11 DIAGNOSIS — R911 Solitary pulmonary nodule: Secondary | ICD-10-CM | POA: Diagnosis not present

## 2020-04-11 DIAGNOSIS — R071 Chest pain on breathing: Secondary | ICD-10-CM

## 2020-04-11 DIAGNOSIS — R0781 Pleurodynia: Secondary | ICD-10-CM

## 2020-04-11 LAB — BASIC METABOLIC PANEL
Anion gap: 9 (ref 5–15)
BUN: 14 mg/dL (ref 8–23)
CO2: 27 mmol/L (ref 22–32)
Calcium: 9.1 mg/dL (ref 8.9–10.3)
Chloride: 100 mmol/L (ref 98–111)
Creatinine, Ser: 0.64 mg/dL (ref 0.44–1.00)
GFR calc Af Amer: >60 mL/min (ref >60)
GFR calc non Af Amer: >60 mL/min (ref >60)
Glucose, Bld: 105 mg/dL — ABNORMAL HIGH (ref 70–99)
Potassium: 3.5 mmol/L (ref 3.5–5.1)
Sodium: 136 mmol/L (ref 135–145)

## 2020-04-11 LAB — RETICULOCYTES
Immature Retic Fract: 16.2 % — ABNORMAL HIGH (ref 2.3–15.9)
RBC.: 4.98 MIL/uL (ref 3.87–5.11)
Retic Ct Pct: 0.4 % (ref 0.4–3.1)

## 2020-04-11 LAB — CBC WITH DIFFERENTIAL/PLATELET
Abs Immature Granulocytes: 0.02 10*3/uL (ref 0.00–0.07)
Basophils Absolute: 0.1 10*3/uL (ref 0.0–0.1)
Basophils Relative: 1 %
Eosinophils Absolute: 0.1 10*3/uL (ref 0.0–0.5)
Eosinophils Relative: 2 %
HCT: 38.1 % (ref 36.0–46.0)
Hemoglobin: 11.4 g/dL — ABNORMAL LOW (ref 12.0–15.0)
Immature Granulocytes: 0 %
Lymphocytes Relative: 17 %
Lymphs Abs: 0.9 10*3/uL (ref 0.7–4.0)
MCH: 22.4 pg — ABNORMAL LOW (ref 26.0–34.0)
MCHC: 29.9 g/dL — ABNORMAL LOW (ref 30.0–36.0)
MCV: 74.7 fL — ABNORMAL LOW (ref 80.0–100.0)
Monocytes Absolute: 0.5 10*3/uL (ref 0.1–1.0)
Monocytes Relative: 10 %
Neutro Abs: 3.8 10*3/uL (ref 1.7–7.7)
Neutrophils Relative %: 70 %
Platelets: 139 10*3/uL — ABNORMAL LOW (ref 150–400)
RBC: 5.1 MIL/uL (ref 3.87–5.11)
RDW: 23.9 % — ABNORMAL HIGH (ref 11.5–15.5)
WBC: 5.4 10*3/uL (ref 4.0–10.5)
nRBC: 0 % (ref 0.0–0.2)

## 2020-04-11 LAB — FERRITIN: Ferritin: 10 ng/mL — ABNORMAL LOW (ref 11–307)

## 2020-04-11 LAB — IRON AND TIBC
Iron: 127 ug/dL (ref 28–170)
Saturation Ratios: 22 % (ref 10.4–31.8)
TIBC: 577 ug/dL — ABNORMAL HIGH (ref 250–450)
UIBC: 450 ug/dL

## 2020-04-11 LAB — FIBRIN DERIVATIVES D-DIMER (ARMC ONLY): Fibrin derivatives D-dimer (ARMC): 1043.59 ng/mL (FEU) — ABNORMAL HIGH (ref 0.00–499.00)

## 2020-04-11 MED ORDER — IOHEXOL 350 MG/ML SOLN
75.0000 mL | Freq: Once | INTRAVENOUS | Status: AC | PRN
  Administered 2020-04-11: 75 mL via INTRAVENOUS

## 2020-04-11 NOTE — Progress Notes (Signed)
The patient c/o increase pain noted to her left upper side when she inhale ( pain level 8)  started this morning.

## 2020-04-11 NOTE — Telephone Encounter (Signed)
Spoke with the patient by phone to inform her to restart her plavix and the last day she will need to take it is 04/17/2020. The patient is schedule for bx on 04/24/2020 at 8:30 AM for a 9:30 appointment. I have informed the patient to restart Plavix today. I have schedule the patient 1 week before the produce to make sure she want need a blood transfusion prior to her appointment. Her hbg need to above 9 and Platelet need to be 70 or above.The patient was understanding and agreeable.

## 2020-04-11 NOTE — Telephone Encounter (Signed)
Spoke with the patient to inform her, Per Dr Mike Gip her CT was normal and no clots were seen. The patient was understanding and agreeable.

## 2020-04-11 NOTE — Patient Instructions (Addendum)
Iron tablets, capsules, extended-release tablets What is this medicine? IRON (AHY ern) replaces iron that is essential to healthy red blood cells. Iron is used to treat iron deficiency anemia. Anemia may cause problems like tiredness, shortness of breath, or slowed growth in children. Only take iron if your doctor has told you to. Do not treat yourself with iron if you are feeling tired. Most healthy people get enough iron in their diets, particularly if they eat cereals, meat, poultry, and fish. This medicine may be used for other purposes; ask your health care provider or pharmacist if you have questions. COMMON BRAND NAME(S): Berneda Rose Ferrous Sulfate, Bifera, Duofer, EZFE, Feosol, Feosol Complete, [employer], [place], [place], [place], [place], Ollie 150, Ferrex-150, Ferrimin, Ferro-Sequels, Journalist, newspaper, Academic librarian, [place], iFerex 150, Nephro-Fer, Niferex, NovaFerrum, Nu-Iron, Poly-Iron, ProFe, Proferrin ES, Slow Fe, Slow Iron, Tandem What should I tell my health care provider before I take this medicine? They need to know if you have any of these conditions:  frequently drink alcohol  bowel disease  hemolytic anemia  iron overload (hemochromatosis, hemosiderosis)  liver disease  problems with swallowing  stomach ulcer or other stomach problems  an unusual or allergic reaction to iron, other medicines, foods, dyes, or preservatives  pregnant or trying to get pregnant  breast-feeding How should I use this medicine? Take this medicine by mouth with a glass of water or fruit juice. Follow the directions on the prescription label. Swallow whole. Do not crush or chew. Take this medicine in an upright or sitting position. Try to take any bedtime doses at least 10 minutes before lying down. You may take this medicine with food. Take your medicine at regular intervals. Do not take your medicine more often than directed. Do not stop taking except on your doctor's  advice. Talk to your pediatrician regarding the use of this medicine in children. While this drug may be prescribed for selected conditions, precautions do apply. Overdosage: If you think you have taken too much of this medicine contact a poison control center or emergency room at once. NOTE: This medicine is only for you. Do not share this medicine with others. What if I miss a dose? If you miss a dose, take it as soon as you can. If it is almost time for your next dose, take only that dose. Do not take double or extra doses. What may interact with this medicine? If you are taking this iron product, you should not take iron in any other medicine or dietary supplement. This medicine may also interact with the following medications:  alendronate  antacids  cefdinir  chloramphenicol  cholestyramine  deferoxamine  dimercaprol  etidronate  medicines for stomach ulcers or other stomach problems  pancreatic enzymes  quinolone antibiotics (examples: Cipro, Floxin, Levaquin, Tequin and others)  risedronate  tetracycline antibiotics (examples: doxycycline, tetracycline, minocycline, and others)  thyroid hormones This list may not describe all possible interactions. Give your health care provider a list of all the medicines, herbs, non-prescription drugs, or dietary supplements you use. Also tell them if you smoke, drink alcohol, or use illegal drugs. Some items may interact with your medicine. What should I watch for while using this medicine? Use iron supplements only as directed by your health care professional. [name] will need important blood work while you are taking this medicine. It may take 3 to 6 months of therapy to treat low iron levels. Pregnant women should follow the dose and length of iron treatment as directed by their  doctors. Do not use iron longer than prescribed, and do not take a higher dose than recommended. Long-term use may cause excess iron to build-up in the  body. Do not take iron with antacids. If you need to take an antacid, take it 2 hours after a dose of iron. What side effects may I notice from receiving this medicine? Side effects that you should report to your doctor or health care professional as soon as possible:  allergic reactions like skin rash, itching or hives, swelling of the face, lips, or tongue  blue lips, nails, or palms  dark colored stools (this may be due to the iron, but can indicate a more serious condition)  drowsiness  pain with or difficulty swallowing  pale or clammy skin  seizures  stomach pain  unusually weak or tired  vomiting  weak, fast, or irregular heartbeat Side effects that usually do not require medical attention (report to your doctor or health care professional if they continue or are bothersome):  constipation  indigestion  nausea or stomach upset This list may not describe all possible side effects. Call your doctor for medical advice about side effects. You may report side effects to FDA at 1-800-FDA-1088. Where should I keep my medicine? Keep out of the reach of children. Even small amounts of iron can be harmful to a child. Store at room temperature between 15 and 30 degrees C (59 and 86 degrees F). Keep container tightly closed. Throw away any unused medicine after the expiration date. NOTE: This sheet is a summary. It may not cover all possible information. If you have questions about this medicine, talk to your doctor, pharmacist, or health care provider.  2020 Elsevier/Gold Standard (2007-12-20 17:03:41)   Iron Sucrose injection What is this medicine? IRON SUCROSE (AHY ern SOO krohs) is an iron complex. Iron is used to make healthy red blood cells, which carry oxygen and nutrients throughout the body. This medicine is used to treat iron deficiency anemia in people with chronic kidney disease. This medicine may be used for other purposes; ask your health care provider or  pharmacist if you have questions. COMMON BRAND NAME(S): Venofer What should I tell my health care provider before I take this medicine? They need to know if you have any of these conditions:  anemia not caused by low iron levels  heart disease  high levels of iron in the blood  kidney disease  liver disease  an unusual or allergic reaction to iron, other medicines, foods, dyes, or preservatives  pregnant or trying to get pregnant  breast-feeding How should I use this medicine? This medicine is for infusion into a vein. It is given by a health care professional in a hospital or clinic setting. Talk to your pediatrician regarding the use of this medicine in children. While this drug may be prescribed for children as young as 2 years for selected conditions, precautions do apply. Overdosage: If you think you have taken too much of this medicine contact a poison control center or emergency room at once. NOTE: This medicine is only for you. Do not share this medicine with others. What if I miss a dose? It is important not to miss your dose. Call your doctor or health care professional if you are unable to keep an appointment. What may interact with this medicine? Do not take this medicine with any of the following medications:  deferoxamine  dimercaprol  other iron products This medicine may also interact with the following medications:  chloramphenicol  deferasirox This list may not describe all possible interactions. Give your health care provider a list of all the medicines, herbs, non-prescription drugs, or dietary supplements you use. Also tell them if you smoke, drink alcohol, or use illegal drugs. Some items may interact with your medicine. What should I watch for while using this medicine? Visit your doctor or healthcare professional regularly. Tell your doctor or healthcare professional if your symptoms do not start to get better or if they get worse. You may need blood  work done while you are taking this medicine. You may need to follow a special diet. Talk to your doctor. Foods that contain iron include: whole grains/cereals, dried fruits, beans, or peas, leafy green vegetables, and organ meats (liver, kidney). What side effects may I notice from receiving this medicine? Side effects that you should report to your doctor or health care professional as soon as possible:  allergic reactions like skin rash, itching or hives, swelling of the face, lips, or tongue  breathing problems  changes in blood pressure  cough  fast, irregular heartbeat  feeling faint or lightheaded, falls  fever or chills  flushing, sweating, or hot feelings  joint or muscle aches/pains  seizures  swelling of the ankles or feet  unusually weak or tired Side effects that usually do not require medical attention (report to your doctor or health care professional if they continue or are bothersome):  diarrhea  feeling achy  headache  irritation at site where injected  nausea, vomiting  stomach upset  tiredness This list may not describe all possible side effects. Call your doctor for medical advice about side effects. You may report side effects to FDA at 1-800-FDA-1088. Where should I keep my medicine? This drug is given in a hospital or clinic and will not be stored at home. NOTE: This sheet is a summary. It may not cover all possible information. If you have questions about this medicine, talk to your doctor, pharmacist, or health care provider.  2020 Elsevier/Gold Standard (2011-05-14 17:14:35)

## 2020-04-17 ENCOUNTER — Other Ambulatory Visit: Payer: Self-pay

## 2020-04-17 ENCOUNTER — Inpatient Hospital Stay: Payer: BLUE CROSS/BLUE SHIELD | Attending: Hematology and Oncology

## 2020-04-17 DIAGNOSIS — F329 Major depressive disorder, single episode, unspecified: Secondary | ICD-10-CM | POA: Insufficient documentation

## 2020-04-17 DIAGNOSIS — Z9981 Dependence on supplemental oxygen: Secondary | ICD-10-CM | POA: Diagnosis not present

## 2020-04-17 DIAGNOSIS — C3431 Malignant neoplasm of lower lobe, right bronchus or lung: Secondary | ICD-10-CM | POA: Diagnosis not present

## 2020-04-17 DIAGNOSIS — E7800 Pure hypercholesterolemia, unspecified: Secondary | ICD-10-CM | POA: Insufficient documentation

## 2020-04-17 DIAGNOSIS — J449 Chronic obstructive pulmonary disease, unspecified: Secondary | ICD-10-CM | POA: Insufficient documentation

## 2020-04-17 DIAGNOSIS — E1151 Type 2 diabetes mellitus with diabetic peripheral angiopathy without gangrene: Secondary | ICD-10-CM | POA: Diagnosis not present

## 2020-04-17 DIAGNOSIS — D509 Iron deficiency anemia, unspecified: Secondary | ICD-10-CM | POA: Insufficient documentation

## 2020-04-17 DIAGNOSIS — M1990 Unspecified osteoarthritis, unspecified site: Secondary | ICD-10-CM | POA: Diagnosis not present

## 2020-04-17 DIAGNOSIS — I10 Essential (primary) hypertension: Secondary | ICD-10-CM | POA: Insufficient documentation

## 2020-04-17 DIAGNOSIS — Z87891 Personal history of nicotine dependence: Secondary | ICD-10-CM | POA: Diagnosis not present

## 2020-04-17 DIAGNOSIS — Z7982 Long term (current) use of aspirin: Secondary | ICD-10-CM | POA: Diagnosis not present

## 2020-04-17 DIAGNOSIS — Z7984 Long term (current) use of oral hypoglycemic drugs: Secondary | ICD-10-CM | POA: Insufficient documentation

## 2020-04-17 DIAGNOSIS — Z791 Long term (current) use of non-steroidal anti-inflammatories (NSAID): Secondary | ICD-10-CM | POA: Diagnosis not present

## 2020-04-17 DIAGNOSIS — Z7951 Long term (current) use of inhaled steroids: Secondary | ICD-10-CM | POA: Insufficient documentation

## 2020-04-17 DIAGNOSIS — G4730 Sleep apnea, unspecified: Secondary | ICD-10-CM | POA: Insufficient documentation

## 2020-04-17 DIAGNOSIS — Z79899 Other long term (current) drug therapy: Secondary | ICD-10-CM | POA: Diagnosis not present

## 2020-04-17 DIAGNOSIS — Z923 Personal history of irradiation: Secondary | ICD-10-CM | POA: Insufficient documentation

## 2020-04-17 DIAGNOSIS — C3411 Malignant neoplasm of upper lobe, right bronchus or lung: Secondary | ICD-10-CM | POA: Insufficient documentation

## 2020-04-17 DIAGNOSIS — Z7902 Long term (current) use of antithrombotics/antiplatelets: Secondary | ICD-10-CM | POA: Diagnosis not present

## 2020-04-17 LAB — CBC WITH DIFFERENTIAL/PLATELET
Abs Immature Granulocytes: 0.03 10*3/uL (ref 0.00–0.07)
Basophils Absolute: 0.1 10*3/uL (ref 0.0–0.1)
Basophils Relative: 1 %
Eosinophils Absolute: 0.2 10*3/uL (ref 0.0–0.5)
Eosinophils Relative: 2 %
HCT: 37.6 % (ref 36.0–46.0)
Hemoglobin: 11.4 g/dL — ABNORMAL LOW (ref 12.0–15.0)
Immature Granulocytes: 0 %
Lymphocytes Relative: 16 %
Lymphs Abs: 1.3 10*3/uL (ref 0.7–4.0)
MCH: 22.4 pg — ABNORMAL LOW (ref 26.0–34.0)
MCHC: 30.3 g/dL (ref 30.0–36.0)
MCV: 73.7 fL — ABNORMAL LOW (ref 80.0–100.0)
Monocytes Absolute: 0.7 10*3/uL (ref 0.1–1.0)
Monocytes Relative: 9 %
Neutro Abs: 5.9 10*3/uL (ref 1.7–7.7)
Neutrophils Relative %: 72 %
Platelets: 172 10*3/uL (ref 150–400)
RBC: 5.1 MIL/uL (ref 3.87–5.11)
RDW: 25.2 % — ABNORMAL HIGH (ref 11.5–15.5)
WBC: 8.1 10*3/uL (ref 4.0–10.5)
nRBC: 0 % (ref 0.0–0.2)

## 2020-04-17 LAB — SAMPLE TO BLOOD BANK

## 2020-04-21 DIAGNOSIS — R071 Chest pain on breathing: Secondary | ICD-10-CM | POA: Insufficient documentation

## 2020-04-23 ENCOUNTER — Other Ambulatory Visit: Payer: Self-pay

## 2020-04-23 ENCOUNTER — Other Ambulatory Visit
Admission: RE | Admit: 2020-04-23 | Discharge: 2020-04-23 | Disposition: A | Discharge status: Home / Self Care | Payer: BLUE CROSS/BLUE SHIELD | Source: Ambulatory Visit | Attending: Hematology and Oncology | Admitting: Hematology and Oncology

## 2020-04-23 ENCOUNTER — Telehealth: Payer: Self-pay

## 2020-04-23 DIAGNOSIS — Z01812 Encounter for preprocedural laboratory examination: Secondary | ICD-10-CM | POA: Diagnosis not present

## 2020-04-23 DIAGNOSIS — Z20822 Contact with and (suspected) exposure to covid-19: Secondary | ICD-10-CM | POA: Diagnosis not present

## 2020-04-23 NOTE — Telephone Encounter (Signed)
Spoke with the patient to inform her that she need a COVID - 19 test before her CT bx tomorrow i informed her that she has until 1 pm today but she need to go as soon as possibe for a rapid test so result can be back before the morning. The patient was understanding and agreeable to go over now to get the COVID 19 test done.

## 2020-04-23 NOTE — Progress Notes (Signed)
Patient on schedule for CT Lung Biopsy 04/24/2020, spoke with patient on phone,who has had COVID test done this am. Has held Plavix per protocol,informed to be here @1000 , NPO after MN as well as driver post procedure for discharge. Stated understanding. Also will hold am dose of Januvia/Metformin.

## 2020-04-24 ENCOUNTER — Ambulatory Visit
Admission: RE | Admit: 2020-04-24 | Discharge: 2020-04-24 | Disposition: A | Discharge status: Home / Self Care | Payer: BLUE CROSS/BLUE SHIELD | Source: Ambulatory Visit | Attending: Interventional Radiology | Admitting: Interventional Radiology

## 2020-04-24 ENCOUNTER — Ambulatory Visit
Admission: RE | Admit: 2020-04-24 | Discharge: 2020-04-24 | Disposition: A | Discharge status: Home / Self Care | Payer: BLUE CROSS/BLUE SHIELD | Source: Ambulatory Visit | Attending: Hematology and Oncology | Admitting: Hematology and Oncology

## 2020-04-24 ENCOUNTER — Other Ambulatory Visit: Payer: Self-pay

## 2020-04-24 DIAGNOSIS — Z85118 Personal history of other malignant neoplasm of bronchus and lung: Secondary | ICD-10-CM | POA: Insufficient documentation

## 2020-04-24 DIAGNOSIS — I10 Essential (primary) hypertension: Secondary | ICD-10-CM | POA: Insufficient documentation

## 2020-04-24 DIAGNOSIS — Z9889 Other specified postprocedural states: Secondary | ICD-10-CM

## 2020-04-24 DIAGNOSIS — Z7984 Long term (current) use of oral hypoglycemic drugs: Secondary | ICD-10-CM | POA: Insufficient documentation

## 2020-04-24 DIAGNOSIS — Z791 Long term (current) use of non-steroidal anti-inflammatories (NSAID): Secondary | ICD-10-CM | POA: Insufficient documentation

## 2020-04-24 DIAGNOSIS — Z801 Family history of malignant neoplasm of trachea, bronchus and lung: Secondary | ICD-10-CM | POA: Insufficient documentation

## 2020-04-24 DIAGNOSIS — J449 Chronic obstructive pulmonary disease, unspecified: Secondary | ICD-10-CM | POA: Diagnosis not present

## 2020-04-24 DIAGNOSIS — Z833 Family history of diabetes mellitus: Secondary | ICD-10-CM | POA: Insufficient documentation

## 2020-04-24 DIAGNOSIS — Z7982 Long term (current) use of aspirin: Secondary | ICD-10-CM | POA: Diagnosis not present

## 2020-04-24 DIAGNOSIS — R918 Other nonspecific abnormal finding of lung field: Secondary | ICD-10-CM | POA: Insufficient documentation

## 2020-04-24 DIAGNOSIS — E1151 Type 2 diabetes mellitus with diabetic peripheral angiopathy without gangrene: Secondary | ICD-10-CM | POA: Diagnosis not present

## 2020-04-24 DIAGNOSIS — G4730 Sleep apnea, unspecified: Secondary | ICD-10-CM | POA: Diagnosis not present

## 2020-04-24 DIAGNOSIS — E785 Hyperlipidemia, unspecified: Secondary | ICD-10-CM | POA: Insufficient documentation

## 2020-04-24 DIAGNOSIS — Z79899 Other long term (current) drug therapy: Secondary | ICD-10-CM | POA: Insufficient documentation

## 2020-04-24 DIAGNOSIS — Z7902 Long term (current) use of antithrombotics/antiplatelets: Secondary | ICD-10-CM | POA: Insufficient documentation

## 2020-04-24 DIAGNOSIS — Z923 Personal history of irradiation: Secondary | ICD-10-CM | POA: Diagnosis not present

## 2020-04-24 DIAGNOSIS — Z87891 Personal history of nicotine dependence: Secondary | ICD-10-CM | POA: Insufficient documentation

## 2020-04-24 LAB — CBC
HCT: 31.8 % — ABNORMAL LOW (ref 36.0–46.0)
Hemoglobin: 10 g/dL — ABNORMAL LOW (ref 12.0–15.0)
MCH: 23.4 pg — ABNORMAL LOW (ref 26.0–34.0)
MCHC: 31.4 g/dL (ref 30.0–36.0)
MCV: 74.3 fL — ABNORMAL LOW (ref 80.0–100.0)
Platelets: 674 10*3/uL — ABNORMAL HIGH (ref 150–400)
RBC: 4.28 MIL/uL (ref 3.87–5.11)
RDW: 27.2 % — ABNORMAL HIGH (ref 11.5–15.5)
WBC: 6.4 10*3/uL (ref 4.0–10.5)
nRBC: 0 % (ref 0.0–0.2)

## 2020-04-24 LAB — GLUCOSE, CAPILLARY
Glucose-Capillary: 112 mg/dL — ABNORMAL HIGH (ref 70–99)
Glucose-Capillary: 111 mg/dL — ABNORMAL HIGH (ref 70–99)

## 2020-04-24 LAB — SARS CORONAVIRUS 2 (TAT 6-24 HRS): SARS Coronavirus 2: NEGATIVE

## 2020-04-24 LAB — PROTIME-INR
INR: 1 (ref 0.8–1.2)
Prothrombin Time: 12.3 seconds (ref 11.4–15.2)

## 2020-04-24 MED ORDER — MIDAZOLAM HCL 2 MG/2ML IJ SOLN
INTRAMUSCULAR | Status: AC | PRN
  Administered 2020-04-24: 0.5 mg via INTRAVENOUS

## 2020-04-24 MED ORDER — FENTANYL CITRATE (PF) 100 MCG/2ML IJ SOLN
INTRAMUSCULAR | Status: AC
  Filled 2020-04-24: qty 2

## 2020-04-24 MED ORDER — FENTANYL CITRATE (PF) 100 MCG/2ML IJ SOLN
INTRAMUSCULAR | Status: AC | PRN
  Administered 2020-04-24 (×2): 25 ug via INTRAVENOUS

## 2020-04-24 MED ORDER — MIDAZOLAM HCL 2 MG/2ML IJ SOLN
INTRAMUSCULAR | Status: AC
  Filled 2020-04-24: qty 2

## 2020-04-24 NOTE — Progress Notes (Signed)
Dr. Pascal Lux in at bedside, speaking extensively re: lung biopsy, procedure itself, post procedure care, risks, benefits of procedure. Pt. Verbalized understanding of conversation.

## 2020-04-24 NOTE — Consult Note (Signed)
Chief Complaint: Enlarging right lower lobe pulmonary nodules  Referring Physician(s): Jennings C  Patient Status: [facility] - Out-pt  History of Present Illness: Angela Jensen is a 64 y.o. female with past medical history significant for COPD, diabetes, hypertension, hyperlipidemia, peripheral arterial disease, sleep apnea and lung cancer who presents today for CT-guided biopsy of enlarging right lower lobe pulmonary nodules.  Patient initially presented for CT-guided pulmonary nodule biopsy approximately 1 week ago, however the procedure was canceled due to preoperative laboratories demonstrating profound anemia.  The patient has since undergone a transfusion states she feels much improved.  Patient is currently without complaint.  Specifically, no chest pain, shortness of breath chills.  No cough or hemoptysis.   Past Medical History:  Diagnosis Date  . Arthritis   . Asthma   . Atherosclerosis of native arteries of extremity with intermittent claudication ([place]) 05/26/2016  . Cancer [facility]) 2012   Right Lung CA  . COPD (chronic obstructive pulmonary disease) ([place])   . Depression   . Diabetes mellitus without complication [facility])    Patient takes Janumet  . Essential hypertension 05/26/2016  . Hypercholesteremia   . Hypertension   . 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 PAD (peripheral artery disease) ([place]) 06/22/2016  . Peripheral vascular disease ([place])   . Personal history of radiation therapy   . Shortness of breath dyspnea    with exertion   . Sleep apnea     Past Surgical History:  Procedure Laterality Date  . CESAREAN SECTION     x3  . COLONOSCOPY WITH PROPOFOL N/A 06/25/2015   Procedure: COLONOSCOPY WITH PROPOFOL;  Surgeon: Lucilla Lame, MD;  Location: [facility] ENDOSCOPY;  Service: Endoscopy;  Laterality: N/A;  . CYST REMOVAL LEG     and on shoulder   . LOWER EXTREMITY ANGIOGRAPHY Left 09/29/2018   Procedure: LOWER EXTREMITY ANGIOGRAPHY;  Surgeon: Algernon Huxley, MD;   Location: [place] CV LAB;  Service: Cardiovascular;  Laterality: Left;  . LUNG BIOPSY  05 15 2013   has lung "spots"  . PERIPHERAL VASCULAR CATHETERIZATION Left 06/01/2016   Procedure: Lower Extremity Angiography;  Surgeon: Algernon Huxley, MD;  Location: [place] CV LAB;  Service: Cardiovascular;  Laterality: Left;  . PERIPHERAL VASCULAR CATHETERIZATION N/A 06/01/2016   Procedure: Abdominal Aortogram w/Lower Extremity;  Surgeon: Algernon Huxley, MD;  Location: [place] CV LAB;  Service: Cardiovascular;  Laterality: N/A;  . PERIPHERAL VASCULAR CATHETERIZATION  06/01/2016   Procedure: Lower Extremity Intervention;  Surgeon: Algernon Huxley, MD;  Location: [place] CV LAB;  Service: Cardiovascular;;  . PERIPHERAL VASCULAR CATHETERIZATION Right 06/08/2016   Procedure: Lower Extremity Angiography;  Surgeon: Algernon Huxley, MD;  Location: [place] CV LAB;  Service: Cardiovascular;  Laterality: Right;  . PERIPHERAL VASCULAR CATHETERIZATION  06/08/2016   Procedure: Lower Extremity Intervention;  Surgeon: Algernon Huxley, MD;  Location: [place] [facility];  Service: Cardiovascular;;    Allergies: Patient has no known allergies.  Medications: Prior to Admission medications   Medication Sig Start Date End Date Taking? Authorizing Provider  albuterol (PROVENTIL) (2.5 MG/3ML) 0.083% nebulizer solution Take 3 mLs (2.5 mg total) by nebulization every 6 (six) hours as needed for wheezing. 10/05/19  Yes Boscia, Heather E, NP  amLODipine (NORVASC) 10 MG tablet Take 1 tablet (10 mg total) by mouth every morning. 12/25/19  Yes Boscia, Heather E, NP  buPROPion (WELLBUTRIN XL) 150 MG 24 hr tablet TAKE 1 TABLET BY MOUTH  TWICE DAILY FOR SMOKING CESSATION 05/19/19  Yes Ronnell Freshwater, NP  gabapentin (NEURONTIN) 100 MG capsule Take 1 capsule by mouth twice daily 01/17/20  Yes Boscia, Greer Ee, NP  JANUVIA 25 MG tablet Take 1 tablet by mouth once daily 02/26/20  Yes Boscia, Heather E, NP    lisinopril-hydrochlorothiazide (ZESTORETIC) 10-12.5 MG tablet Take 1 tablet by mouth every morning. 09/07/19  Yes Boscia, Heather E, NP  meloxicam (MOBIC) 7.5 MG tablet Take 1 tablet (7.5 mg total) by mouth 2 (two) times daily. 08/21/19  Yes Ronnell Freshwater, NP  metFORMIN (GLUCOPHAGE-XR) 500 MG 24 hr tablet Take 1 tablet (500 mg total) by mouth daily with breakfast. 01/29/20  Yes Boscia, Heather E, NP  mometasone-formoterol (DULERA) 200-5 MCG/ACT AERO Inhale 1 puff into the lungs 2 (two) times daily. Please fill as 90 day prescription 01/17/20  Yes Boscia, Heather E, NP  OXYGEN Inhale 2 L into the lungs at bedtime.   Yes [provider]  potassium chloride (KLOR-CON) 10 MEQ tablet TAKE 1 TABLET BY MOUTH EVERY OTHER DAY FOR  LOW  POTASSIUM 04/10/20  Yes Luiz Ochoa, NP  rOPINIRole (REQUIP) 0.5 MG tablet Take 1 tablet (0.5 mg total) by mouth 2 (two) times daily. 03/21/20  Yes Boscia, Greer Ee, NP  rosuvastatin (CRESTOR) 10 MG tablet Take 1 tablet (10 mg total) by mouth at bedtime. 01/17/20  Yes Boscia, Greer Ee, NP  SPIRIVA HANDIHALER 18 MCG inhalation capsule INHALE THE CONTENTS OF ONE CAPSULE ONCE DAILY. 02/18/20  Yes [name], MD  VENTOLIN HFA 108 [phone] Base) MCG/ACT inhaler INHALE 2 PUFFS INTO LUNGS EVERY 4 HOURS AS NEEDED FOR WHEEZING OR SHORTNESS OF BREATH 10/31/19  Yes Ronnell Freshwater, NP  Accu-Chek FastClix Lancets MISC Use as directed twice a day diag E11.65 03/15/19   Ronnell Freshwater, NP  ALPRAZolam Duanne Moron) 0.25 MG tablet May take 1/2 to 1 tablet po one time one to two hours prior to flight. May repeat dose in one hour as needed for persistent anxiety. Patient not taking: Reported on 04/10/2020 07/21/19   Ronnell Freshwater, NP  amoxicillin (AMOXIL) 875 MG tablet Take 1 tablet (875 mg total) by mouth 2 (two) times daily. Patient not taking: Reported on 04/24/2020 01/01/20   Ronnell Freshwater, NP  aspirin EC 81 MG tablet Take 1 tablet (81 mg total) by mouth daily. 06/01/16   Algernon Huxley,  MD  clopidogrel (PLAVIX) 75 MG tablet Take 1 tablet by mouth once daily 03/25/20   Kris Hartmann, NP  diclofenac Sodium (VOLTAREN) 1 % GEL Apply 4 g topically 4 (four) times daily. Patient not taking: Reported on 04/10/2020 09/22/19   Ronnell Freshwater, NP  glucose blood (ACCU-CHEK AVIVA PLUS) test strip Use as instructed to check blood sugars three times daily.  E11.65 03/16/19   Ronnell Freshwater, NP     Family History  Problem Relation Age of Onset  . Lung cancer Father   . Diabetes Mother   . Hypercholesterolemia Mother   . Diabetes Sister   . Diabetes Maternal Grandmother   . Diabetes Paternal Grandmother   . Diabetes Sister   . Heart attack Brother   . Coronary artery disease Brother   . Vascular Disease Brother   . Hypertension Sister   . Heart attack Brother     Social History   Socioeconomic History  . Marital status: Widowed    Spouse name: Not on file  . Number of children: Not  on file  . Years of education: Not on file  . Highest education level: Not on file  Occupational History  . Not on file  Tobacco Use  . Smoking status: Former Smoker    Packs/day: 1.00    Years: 37.00    Pack years: 37.00    Types: Cigarettes    Quit date: 02/06/2010    Years since quitting: 10.2  . Smokeless tobacco: Former Systems developer    Types: Snuff  Vaping Use  . Vaping Use: Never used  Substance and Sexual Activity  . Alcohol use: Yes    Alcohol/week: 5.0 standard drinks    Types: 5 Cans of beer per week    Comment: /h x of alcohol abuse -stopped 2012- now drinks 5 beer per w  . Drug use: Not Currently    Types: Marijuana, "Crack" cocaine, Cocaine    Comment: hx of cocaine use- last use 2015; last use sat marijuana6/22/19,   . Sexual activity: Yes  Other Topics Concern  . Not on file  Social History Narrative   Lives with Significant Other x 43 years   Social Determinants of Health   Financial Resource Strain:   . Difficulty of Paying Living Expenses: Not on file  Food  Insecurity:   . Worried About Charity fundraiser in the Last Year: Not on file  . Ran Out of Food in the Last Year: Not on file  Transportation Needs:   . Lack of Transportation (Medical): Not on file  . Lack of Transportation (Non-Medical): Not on file  Physical Activity:   . Days of Exercise per Week: Not on file  . Minutes of Exercise per Session: Not on file  Stress:   . Feeling of Stress : Not on file  Social Connections:   . Frequency of Communication with Friends and Family: Not on file  . Frequency of Social Gatherings with Friends and Family: Not on file  . Attends Religious Services: Not on file  . Active Member of Clubs or Organizations: Not on file  . Attends Archivist Meetings: Not on file  . Marital Status: Not on file    ECOG Status: 0 - Asymptomatic  Review of Systems: A 12 point ROS discussed and pertinent positives are indicated in the HPI above.  All other systems are negative.  Review of Systems  Vital Signs: BP 121/80   Temp 97.9 F (36.6 C) (Oral)   Resp 20   Ht 4\' 11"  (1.499 m)   Wt 49.9 kg   SpO2 94%   BMI 22.22 kg/m   Physical Exam  Imaging: CT ANGIO CHEST PE W OR WO CONTRAST  Result Date: 04/11/2020 CLINICAL DATA:  Chest pain.  History of lung cancer. EXAM: CT ANGIOGRAPHY CHEST WITH CONTRAST TECHNIQUE: Multidetector CT imaging of the chest was performed using the standard protocol during bolus administration of intravenous contrast. Multiplanar CT image reconstructions and MIPs were obtained to evaluate the vascular anatomy. CONTRAST:  41mL OMNIPAQUE IOHEXOL 350 MG/ML SOLN COMPARISON:  March 04, 2020. FINDINGS: Cardiovascular: Satisfactory opacification of the pulmonary arteries to the segmental level. No evidence of pulmonary embolism. Normal heart size. No pericardial effusion. Mediastinum/Nodes: No enlarged mediastinal, hilar, or axillary lymph nodes. Thyroid gland, trachea, and esophagus demonstrate no significant findings.  Lungs/Pleura: No pneumothorax or pleural effusion is noted. Left lung is unremarkable. Stable nodular densities are noted in the right lower lobe concerning for metastatic disease. Stable scarring or fibrosis is noted in the right middle lobe.  Upper Abdomen: No acute abnormality. Musculoskeletal: Old right rib fracture is noted. Review of the MIP images confirms the above findings. IMPRESSION: 1. No definite evidence of pulmonary embolus. 2. Stable nodular densities are noted in the right lower lobe concerning for metastatic disease. 3. Stable scarring or fibrosis is noted in the right middle lobe. Electronically Signed   By: Marijo Conception M.D.   On: 04/11/2020 13:49    Labs:  CBC: Recent Labs    04/04/20 1003 04/04/20 1003 04/04/20 1618 04/04/20 2204 04/11/20 0923 04/17/20 0834  WBC 4.5  --  5.1  --  5.4 8.1  HGB 6.6*   < > 6.5* 9.2* 11.4* 11.4*  HCT 23.6*   < > 23.3* 31.3* 38.1 37.6  PLT 345  --  314  --  139* 172   < > = values in this interval not displayed.    COAGS: Recent Labs    04/04/20 1003  INR 0.9    BMP: Recent Labs    07/21/19 1143 03/28/20 1037 04/11/20 0923  NA 135 137 136  K 4.0 4.1 3.5  CL 101 101 100  CO2 28 23 27   GLUCOSE 165* 140* 105*  BUN 15 10 14   CALCIUM 8.9 9.4 9.1  CREATININE 0.80 0.76 0.64  GFRNONAA >60 83 >60  GFRAA >60 96 >60    LIVER FUNCTION TESTS: Recent Labs    07/21/19 1143 03/28/20 1037  BILITOT 0.6 0.2  AST 37 31  ALT 26 24  ALKPHOS 72 81  PROT 7.9 7.6  ALBUMIN 4.2 4.7    TUMOR MARKERS: No results for input(s): AFPTM, CEA, CA199, CHROMGRNA in the last 8760 hours.  Assessment and Plan:  Angela Jensen is a 64 y.o. female with past medical history significant for COPD, diabetes, hypertension, hyperlipidemia, peripheral arterial disease, sleep apnea and lung cancer who presents today for CT-guided biopsy of enlarging right lower lobe pulmonary nodules.  Patient initially presented for CT-guided pulmonary nodule biopsy  approximately 1 week ago, however the procedure was canceled due to preoperative laboratories demonstrating profound anemia.  The patient has since undergone a transfusion states she feels much improved.  Patient is currently without complaint.   Risks and benefits of CT guided lung nodule biopsy was discussed with the patient including, but not limited to bleeding, hemoptysis, respiratory failure requiring intubation, infection, pneumothorax requiring chest tube placement, stroke from air embolism or even death.  All of the patient's questions were answered and the patient is agreeable to proceed.  Consent signed and in chart.    Thank you for this interesting consult.  I greatly enjoyed meeting Angela Jensen and look forward to participating in their care.  A copy of this report was sent to the requesting provider on this date.  Electronically Signed: Sandi Mariscal, MD 04/24/2020, 10:49 AM   I spent a total of 15 Minutes in face to face in clinical consultation, greater than 50% of which was counseling/coordinating care for CT-guided right lower lobe pulmonary nodule biopsy

## 2020-04-24 NOTE — Procedures (Signed)
Pre procedural Dx: Right lower lobe pulmonary nodule Post procedural Dx: Same  Technically successful CT guided biopsy of indeterminate right lower lobe pulmonary nodule.   EBL: Trace Complications: Procedure complicated by a small amount of peri-biopsy hemorrhage.   Ronny Bacon, MD Pager #: [phone]

## 2020-04-25 LAB — SURGICAL PATHOLOGY

## 2020-04-25 NOTE — Progress Notes (Signed)
[facility]  [address] [place], [place] [address] Phone: [phone]  Fax: [phone]   Clinic Day:  04/29/2020  Referring physician: Ronnell Freshwater, NP  Chief Complaint: Angela Jensen is a 64 y.o. female with a history of stage I lung cancer s/p radiationand iron deficiency anemia who is seen for review of interval lung biopsy and discussion regarding direction of therapy.  HPI: The patient was last seen in the medical oncology clinic on 04/11/2020. At that time, she noted left sided pleuritic pain and recent calf tenderness.  Exam revealed no palpable cords or Homan's sign. Hematocrit was 38.1, hemoglobin 11.4, platelets 139,000, WBC 5,400. CMP was normal. Ferritin was 10 with an iron saturation of 22% and a TIBC of 577. Reticulocyte count was 0.4%. D-dimers were 1043.59 (high).  Chest CT angiogram on 04/11/2020 revealed no definite evidence of pulmonary embolus. Stable nodular densities were noted in the right lower lobe concerning for metastatic disease. Stable scarring or fibrosis was noted in the right middle lobe.  Right lower lobe pulmonary nodule biopsy on 04/24/2020 revealed adenocarcinoma of the lung with papillary and solid patterns. It was histologically very similar to the previous RLL biopsy. No lepidic pattern was seen in the current sample, unlike the previous RLL specimen. There were areas of clear cell change, a finding also noted in previous biopsies. Previously performed IHC showed that the tumor cells were positive for TTF1 and Napsin A, and negative for PAX8, a profile which supports lung origin.  CXR on 04/24/2020 revealed no evidence of complication following right lower lobe pulmonary nodule biopsy. Specifically, there was no pneumothorax.  During the interim, she has been fine. The patient is scheduled to see Dr. Baruch Gouty in 05/2020.  She denies bleeding of any kind. The patient is back on aspirin and Plavix after the biopsy.  She takes one iron pill daily with water. She will start taking iron with orange juice and vitamin C.   Past Medical History:  Diagnosis Date  . Arthritis   . Asthma   . Atherosclerosis of native arteries of extremity with intermittent claudication ([place]) 05/26/2016  . [facility]) 2012   Right Lung CA  . COPD (chronic obstructive pulmonary disease) ([place])   . Depression   . Diabetes mellitus without complication [facility])    Patient takes Janumet  . Essential hypertension 05/26/2016  . Hypercholesteremia   . Hypertension   . 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 PAD (peripheral artery disease) ([place]) 06/22/2016  . Peripheral vascular disease ([place])   . Personal history of radiation therapy   . Shortness of breath dyspnea    with exertion   . Sleep apnea     Past Surgical History:  Procedure Laterality Date  . CESAREAN SECTION     x3  . COLONOSCOPY WITH PROPOFOL N/A 06/25/2015   Procedure: COLONOSCOPY WITH PROPOFOL;  Surgeon: Lucilla Lame, MD;  Location: [facility] ENDOSCOPY;  Service: Endoscopy;  Laterality: N/A;  . CYST REMOVAL LEG     and on shoulder   . LOWER EXTREMITY ANGIOGRAPHY Left 09/29/2018   Procedure: LOWER EXTREMITY ANGIOGRAPHY;  Surgeon: Algernon Huxley, MD;  Location: [place] CV LAB;  Service: Cardiovascular;  Laterality: Left;  . LUNG BIOPSY  05 15 2013   has lung "spots"  . PERIPHERAL VASCULAR CATHETERIZATION Left 06/01/2016   Procedure: Lower Extremity Angiography;  Surgeon: Algernon Huxley, MD;  Location: [place] CV LAB;  Service: Cardiovascular;  Laterality: Left;  . PERIPHERAL VASCULAR CATHETERIZATION N/A 06/01/2016   Procedure: Abdominal Aortogram w/Lower Extremity;  Surgeon: Algernon Huxley, MD;  Location: [place] CV LAB;  Service: Cardiovascular;  Laterality: N/A;  . PERIPHERAL VASCULAR CATHETERIZATION  06/01/2016   Procedure: Lower Extremity Intervention;  Surgeon: Algernon Huxley, MD;  Location: [place] CV [facility];  Service: Cardiovascular;;  . PERIPHERAL  VASCULAR CATHETERIZATION Right 06/08/2016   Procedure: Lower Extremity Angiography;  Surgeon: Algernon Huxley, MD;  Location: [place] CV [facility];  Service: Cardiovascular;  Laterality: Right;  . PERIPHERAL VASCULAR CATHETERIZATION  06/08/2016   Procedure: Lower Extremity Intervention;  Surgeon: Algernon Huxley, MD;  Location: [place] [facility];  Service: Cardiovascular;;    Family History  Problem Relation Age of Onset  . Lung cancer Father   . Diabetes Mother   . Hypercholesterolemia Mother   . Diabetes Sister   . Diabetes Maternal Grandmother   . Diabetes Paternal Grandmother   . Diabetes Sister   . Heart attack Brother   . Coronary artery disease Brother   . Vascular Disease Brother   . Hypertension Sister   . Heart attack Brother     Social History:  reports that she quit smoking about 10 years ago. Her smoking use included cigarettes. She has a 37.00 pack-year smoking history. She has quit using smokeless tobacco.  Her smokeless tobacco use included snuff. She reports current alcohol use of about 5.0 standard drinks of alcohol per week. She reports previous drug use. Drugs: Marijuana, "Crack" cocaine, and Cocaine. She started smoking at 17 until she was "71 something". Her boyfriend is [name]: [phone]. She lives in [place]. The patient is alone today.  Allergies: No Known Allergies  Current Medications: Current Outpatient Medications  Medication Sig Dispense Refill  . Accu-Chek FastClix Lancets MISC Use as directed twice a day diag E11.65 100 each 1  . albuterol (PROVENTIL) (2.5 MG/3ML) 0.083% nebulizer solution Take 3 mLs (2.5 mg total) by nebulization every 6 (six) hours as needed for wheezing. 75 mL 3  . amLODipine (NORVASC) 10 MG tablet Take 1 tablet (10 mg total) by mouth every morning. 30 tablet 5  . aspirin EC 81 MG tablet Take 1 tablet (81 mg total) by mouth daily. 150 tablet 2  . buPROPion (WELLBUTRIN XL) 150 MG 24 hr tablet TAKE 1 TABLET BY MOUTH TWICE  DAILY FOR SMOKING CESSATION 60 tablet 6  . clopidogrel (PLAVIX) 75 MG tablet Take 1 tablet by mouth once daily 90 tablet 0  . gabapentin (NEURONTIN) 100 MG capsule Take 1 capsule by mouth twice daily 60 capsule 3  . glucose blood (ACCU-CHEK AVIVA PLUS) test strip Use as instructed to check blood sugars three times daily.  E11.65 250 each 5  . JANUVIA 25 MG tablet Take 1 tablet by mouth once daily 90 tablet 0  . lisinopril-hydrochlorothiazide (ZESTORETIC) 10-12.5 MG tablet Take 1 tablet by mouth every morning. 30 tablet 5  . meloxicam (MOBIC) 7.5 MG tablet Take 1 tablet (7.5 mg total) by mouth 2 (two) times daily. 60 tablet 3  . metFORMIN (GLUCOPHAGE-XR) 500 MG 24 hr tablet Take 1 tablet (500 mg total) by mouth daily with breakfast. 30 tablet 3  . mometasone-formoterol (DULERA) 200-5 MCG/ACT AERO Inhale 1 puff into the lungs 2 (two) times daily. Please fill as 90 day prescription 39 g 1  . OXYGEN Inhale 2 L into the lungs at bedtime.    . potassium chloride (KLOR-CON) 10 MEQ tablet TAKE 1  TABLET BY MOUTH EVERY OTHER DAY FOR  LOW  POTASSIUM 45 tablet 3  . rOPINIRole (REQUIP) 0.5 MG tablet Take 1 tablet (0.5 mg total) by mouth 2 (two) times daily. 180 tablet 0  . rosuvastatin (CRESTOR) 10 MG tablet Take 1 tablet (10 mg total) by mouth at bedtime. 30 tablet 5  . SPIRIVA HANDIHALER 18 MCG inhalation capsule INHALE THE CONTENTS OF ONE CAPSULE ONCE DAILY. 90 capsule 0  . VENTOLIN HFA 108 (90 Base) MCG/ACT inhaler INHALE 2 PUFFS INTO LUNGS EVERY 4 HOURS AS NEEDED FOR WHEEZING OR SHORTNESS OF BREATH 18 g 3  . ALPRAZolam (XANAX) 0.25 MG tablet May take 1/2 to 1 tablet po one time one to two hours prior to flight. May repeat dose in one hour as needed for persistent anxiety. (Patient not taking: Reported on 04/10/2020) 10 tablet 0  . amoxicillin (AMOXIL) 875 MG tablet Take 1 tablet (875 mg total) by mouth 2 (two) times daily. (Patient not taking: Reported on 04/24/2020) 20 tablet 0  . diclofenac Sodium (VOLTAREN)  1 % GEL Apply 4 g topically 4 (four) times daily. (Patient not taking: Reported on 04/10/2020) 100 g 2   No current facility-administered medications for this visit.    Review of Systems  Constitutional: Positive for weight loss (3 lbs). Negative for chills, fever and malaise/fatigue.  HENT: Negative for congestion, ear discharge, ear pain, hearing loss, nosebleeds, sinus pain, sore throat and tinnitus.   Eyes: Negative.  Negative for blurred vision, double vision and photophobia.  Respiratory: Negative for cough, hemoptysis, sputum production and shortness of breath.   Cardiovascular: Negative for chest pain, palpitations, orthopnea, leg swelling and PND.  Gastrointestinal: Negative.  Negative for abdominal pain, blood in stool, constipation, diarrhea, heartburn, melena, nausea and vomiting.       Eats a variety of iron-rich foods.  Genitourinary: Negative.  Negative for dysuria, frequency, hematuria and urgency.  Musculoskeletal: Negative for back pain (comes and goes), falls, joint pain, myalgias and neck pain.  Skin: Negative.  Negative for itching and rash.  Neurological: Negative.  Negative for dizziness, tingling, sensory change, speech change, focal weakness, weakness and headaches.  Endo/Heme/Allergies: Negative.  Does not bruise/bleed easily.       Diabetes.  Psychiatric/Behavioral: Negative.  Negative for depression and memory loss. The patient is not nervous/anxious and does not have insomnia.   All other systems reviewed and are negative.   Performance status (ECOG): 0  Vitals:  Blood pressure 123/78, pulse (!) 106, temperature (!) 97.1 F (36.2 C), temperature source Tympanic, resp. rate 18, weight 107 lb 11.1 oz (48.9 kg), SpO2 91 %.   Physical Exam Vitals and nursing note reviewed.  Constitutional:      General: She is not in acute distress.    Appearance: She is not diaphoretic.  HENT:     Head: Atraumatic.     Comments: Dark hair pulled back. Eyes:     General:  No scleral icterus.    Conjunctiva/sclera: Conjunctivae normal.     Comments: Glasses.  Brown eyes.  Cardiovascular:     Rate and Rhythm: Normal rate and regular rhythm.     Pulses: Normal pulses.     Heart sounds: Normal heart sounds.  Pulmonary:     Effort: Pulmonary effort is normal. No respiratory distress.     Breath sounds: Normal breath sounds. No stridor. No wheezing, rhonchi or rales.  Abdominal:     General: There is no distension.     Palpations: Abdomen is  soft. There is no hepatomegaly or splenomegaly.     Tenderness: There is no abdominal tenderness.  Musculoskeletal:        General: No swelling.  Skin:    General: Skin is warm and dry.  Neurological:     Mental Status: She is alert and oriented to person, place, and time.  Psychiatric:        Behavior: Behavior normal.        Thought Content: Thought content normal.        Judgment: Judgment normal.    Imaging studies: 07/31/2011:  Chest CT revealed a 1.1 x 0.6 cm RUL nodule and a 9 mm inferior aspect RUL nodule and a 4 mm nodule in the anterior aspect of the inferior aspect of the RUL. 08/03/2011:  PET scan revealed a 10-mm spiculated right apical pulmonary nodule (SUV 2.5) suspcious for lung cancer. Left ovary was prominent (3.9 x 2.4 cm).  01/30/2015:  PET scanrevealed a hypermetabolic and enlarging RUL pulmonary nodules concerning for lung cancer recurrence. There was a small focus of peripheral nodular consolidation in the superior segment of the right lower lobe with differential including inflammation/infection versus neoplasm. There was no evidence of mediastinal nodal metastasis.  03/27/2016:  PET scanrevealed no hypermetabolic activity to suggest recurrent malignancy. There was a soft tissue density in the left pelvis near the inguinal ring that was not hypermetabolic, but prominent and probably separate from the left ovary. This could be a wandering fibroid. Although the overall appearance had not  changed from 2012 and was not hypermetabolic, pelvic sonography or pelvic MRI for further characterization of the uterus and ovaries can be considered. 08/01/2015:  Chest CTrevealed complete resolution of the prior nodule in the right lung apex.  01/31/2016:  Chest CTrevealed no active cardiopulmonary abnormalities. There was stable appearance of right apical and right middle lobe scar like densities.  02/02/2017:  Low dose chest CTrevealed new ground-glass nodule in the right lower lobe. Lung-RADS 3 (probably benign findings).  07/29/2017:  Low dose chest CT revealed enlargement of the previously observed sub-solid pulmonary lesion. RIGHT upper lobe lesion measured 1.4 x 2.1 x 1.6 cm (previously 1.5 x 1.1 x 1.7 cm). There was also areas of focal architectural distortion in the RIGHT upper and middle lobes that were felt to be most compatible with areas of chronic post infectious or inflammatory scarring. Aortic value calcifications were noted. Lung-RADS 4BS (suspicious).  01/19/2018:  Chest CTrevealed a subsolid 2.1 cm peripheral right lower lobe pulmonary nodule. Although not appreciably changed in size, subjectively this nodule had increased in density, and remains suspicious for primary bronchogenic adenocarcinoma. There was no thoracic adenopathy. 07/18/2018:  Chest CT revealed early postradiation change in the peripheral right lower lobe at the site of the treated tumor, with no residual measurable pulmonary nodule in this location.There were no findings of metastatic disease in the chest. 01/17/2019:  Chest CT without contrast revealed decreased subpleural reticulation at the SBRT site in the RIGHT lower lobe consistent with improved radiation change. There was residual nodularity along the fissure at this site with recommendation for close attention on follow-up. There was stable pulmonary scarring in the right upper lobe and right middle lobe. There was centrilobular  emphysema. 07/19/2019:  Chest CT revealed a 6 x 6 mm nodule in the right lower lobe had increased in size from 3 mm previously. The nodule was separate and discrete from the previous treatment beds. Metachronous or metastatic disease a concern. Close CT follow up was recommended.  There was a 5 mm right lower lobe pulmonary nodule new in the interval. Close attention on follow up was recommended. There was stable appearance of parenchymal scarring in the right lung period.  03/04/2020:  Chest CT revealed enlarging and new pulmonary nodules suspicious for metastatic disease or metachronous primary.  03/19/2020:  PET scan revealed that the right lower lobe pulmonary nodules did not demonstrate any hypermetabolism to suggest neoplasm. However, these were somewhat worrisome; recommendation was close CT surveillance. There was no mediastinal or hilar lymphadenopathy. There were stable right lung scarring changes and significant underlying emphysema.   No visits with results within 3 Day(s) from this visit.  Latest known visit with results is:  Hospital Outpatient Visit on 04/24/2020  Component Date Value Ref Range Status  . Glucose-Capillary 04/24/2020 111* 70 - 99 mg/dL Final   Glucose reference range applies only to samples taken after fasting for at least 8 hours.    Assessment:  Angela Jensen is a 64 y.o. 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 She was not a good surgery candidate secondary to her lung function (FEV1 28%).  PET scan on 08/03/2011 revealed a 10-mm spiculated right apical pulmonary nodule (SUV 2.5) suspcious for lung cancer. Left ovary was prominent (3.9 x 2.4 cm). CA-125 was 9.3 (normal).  CT guided biopsy of the superior and inferior RUL noduleson 12/30/2011 revealed well differentiated adenocarcinoma of lung primary. EGFR testing revealed no mutation. She received IMRT 6000 cGyto both nodules from 01/26/2012 -  03/10/2012.  PET scanon 01/30/2015 revealed a hypermetabolic and enlarging RUL pulmonary nodules concerning for lung cancer recurrence. There was a small focus of peripheral nodular consolidation in the superior segment of the right lower lobe with differential including inflammation/infection versus neoplasm. There was no evidence of mediastinal nodal metastasis.   In 04/2015, there was an attempt at radiofrequency ablation of 1 enlarging RUL nodule. At the time of the procedure, the nodule had shrunk to 5 mm from 9 mm.  Chest CTon 01/19/2018 revealed a subsolid 2.1 cm peripheral right lower lobe pulmonary nodule. Although not appreciably changed in size, subjectively this nodule had increased in density, and remains suspicious for primary bronchogenic adenocarcinoma. There was no thoracic adenopathy.  CT guided RLL biopsyon 02/09/2018 revealed adenocarcinoma of lung origin, lepidic and papillary patterns.  She received6000 cGy in 5 fractions toRLL nodulefrom 03/22/2018 - 04/05/2018.  Chest CT on 03/04/2020 revealed enlarging and new pulmonary nodules suspicious for metastatic disease or metachronous primary.  PET scan on 03/19/2020 revealed that the right lower lobe pulmonary nodules did not demonstrate any hypermetabolism to suggest neoplasm. However, these were somewhat worrisome; recommendation was close CT surveillance. There was no mediastinal or hilar lymphadenopathy.   She has an elevated CEAof unclear etiology. CEA was 8.1 on 01/31/2016, 9.2 on 03/03/2016, 7.9 on 10/12/2017, and 7.9 on 01/19/2019.  Colonoscopyon 06/25/2015 revealed a 4 mm polyp in the sigmoid colon. Pathology revealed a tubular adenoma without evidence of dysplasia or malignancy.  Pelvic MRIon 04/30/2016 revealed a 3.4 x 2.5 cm ovoid mass in the LEFT adnexa with signal characteristics identical to the intramural leiomyomas and consistent with an exophytic leiomyoma. 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 Endometrium was normal with a normal junctional zone. Ovaries were not identified.  She has a history of an elevated total protein.  Protein was 8.3 (6.5-8.1) on 01/19/2019.  M-spike and FLCA were normal.  She has iron deficiency anemia.  She required 2 units of  PRBCs on 04/04/2020.  Colonoscopy on 06/25/2015 revealed one 4 mm polyp (tubular adenoma) in the sigmoid colon. There were non-bleeding internal hemorrhoids.  The patient received the [place] COVID-19 vaccine on 11/20/2019 and 12/11/2019.  Symptomatically, she is doing well.  She denies any complaints.  Plan: 1.   Labs today: CBC. 2.   Stage I RUL adenocarcinoma Patient is s/p IMRT (completed 03/10/2012). Continue to monitor. 3.RLL adenocarcinoma Patient is s/p SBRT (completed 04/05/2018).  PET scan on 03/19/2020 revealed RLL pulmonary nodules are 10 mm and 7 mm.  Review lung biopsy from 04/24/2020.   Pathology confirmed adenocarcinoma of the lung with papillary and solid patterns.    It was histologically very similar to the previous RLL biopsy.    No lepidic pattern was seen.  Referral to radiation oncology for consideration of SBRT.  Discuss sending [facility] on recent biopsy ([id number]). 4.   Iron deficiency anemia  Hematocrit 36.2.  Hemoglobin 10.9.  MCV 75.7 on 04/29/2020   Patient received 2 units of PRBCs on 04/04/2020.  She is on oral iron.  Increase oral iron to twice daily with orange juice or vitamin C.    Patient now agrees to follow-up with GI (Dr Allen Norris) as she is s/p lung biopsy.  Review plan for weekly Venofer if iron stores do not improve. 5.   CBC today and in 1 week. 6.   If CBC decreasing next week, begin weekly Venofer. 7.   [facility] on recent biopsy ([id number]). 8.   Consult Dr Baruch Gouty. 9.   Consult Dr Allen Norris. 10.   Tumor board on 05/02/2020. 11.   RTC in 1 month for MD assessment and labs (CBC with diff, ferritin- day  before) and +/- Venofer.  I discussed the assessment and treatment plan with the patient.  The patient was provided an opportunity to ask questions and all were answered.  The patient agreed with the plan and demonstrated an understanding of the instructions.  The patient was advised to call back or seek an in person evaluation if the symptoms worsen or if the condition fails to improve as anticipated.  I provided 19 minutes of face-to-face time during this this encounter and > 50% was spent counseling as documented under my assessment and plan.   Nolon Stalls, MD, PhD  04/29/2020, 1:30 PM  I, Mirian Mo Tufford, am acting as Education administrator for [employer]. Mike Gip, MD, PhD.  I, Carrianne Hyun C. Mike Gip, MD, have reviewed the above documentation for accuracy and completeness, and I agree with the above.

## 2020-04-29 ENCOUNTER — Other Ambulatory Visit: Payer: Self-pay

## 2020-04-29 ENCOUNTER — Inpatient Hospital Stay (HOSPITAL_BASED_OUTPATIENT_CLINIC_OR_DEPARTMENT_OTHER): Payer: BLUE CROSS/BLUE SHIELD | Admitting: Hematology and Oncology

## 2020-04-29 ENCOUNTER — Encounter: Payer: Self-pay | Admitting: Hematology and Oncology

## 2020-04-29 VITALS — BP 123/78 | HR 106 | Temp 97.1°F | Resp 18 | Wt 107.7 lb

## 2020-04-29 DIAGNOSIS — D508 Other iron deficiency anemias: Secondary | ICD-10-CM | POA: Diagnosis not present

## 2020-04-29 DIAGNOSIS — C3411 Malignant neoplasm of upper lobe, right bronchus or lung: Secondary | ICD-10-CM | POA: Diagnosis not present

## 2020-04-29 DIAGNOSIS — C3431 Malignant neoplasm of lower lobe, right bronchus or lung: Secondary | ICD-10-CM | POA: Diagnosis not present

## 2020-04-29 LAB — CBC
HCT: 36.2 % (ref 36.0–46.0)
Hemoglobin: 10.9 g/dL — ABNORMAL LOW (ref 12.0–15.0)
MCH: 22.8 pg — ABNORMAL LOW (ref 26.0–34.0)
MCHC: 30.1 g/dL (ref 30.0–36.0)
MCV: 75.7 fL — ABNORMAL LOW (ref 80.0–100.0)
Platelets: 445 10*3/uL — ABNORMAL HIGH (ref 150–400)
RBC: 4.78 MIL/uL (ref 3.87–5.11)
RDW: 27.4 % — ABNORMAL HIGH (ref 11.5–15.5)
WBC: 10.6 10*3/uL — ABNORMAL HIGH (ref 4.0–10.5)
nRBC: 0 % (ref 0.0–0.2)

## 2020-04-29 NOTE — Patient Instructions (Signed)
  Increase iron to 1 pill twice a day with orange juice or vitamin C.

## 2020-04-29 NOTE — Progress Notes (Signed)
Patient here for oncology follow-up appointment, expresses no complaints or concerns at this time.    

## 2020-04-30 ENCOUNTER — Telehealth: Payer: Self-pay

## 2020-04-30 NOTE — Telephone Encounter (Signed)
Foudation one has been faxed to [phone]. The patient has been informed and fax conformation has been received.

## 2020-05-01 ENCOUNTER — Inpatient Hospital Stay
Admission: EM | Admit: 2020-05-01 | Discharge: 2020-05-05 | DRG: 200 | Disposition: A | Discharge status: Home / Self Care | Payer: BLUE CROSS/BLUE SHIELD | Attending: Family Medicine | Admitting: Family Medicine

## 2020-05-01 ENCOUNTER — Other Ambulatory Visit: Payer: Self-pay

## 2020-05-01 ENCOUNTER — Emergency Department: Payer: BLUE CROSS/BLUE SHIELD

## 2020-05-01 DIAGNOSIS — G4730 Sleep apnea, unspecified: Secondary | ICD-10-CM | POA: Diagnosis present

## 2020-05-01 DIAGNOSIS — Z7982 Long term (current) use of aspirin: Secondary | ICD-10-CM

## 2020-05-01 DIAGNOSIS — J95811 Postprocedural pneumothorax: Principal | ICD-10-CM | POA: Diagnosis present

## 2020-05-01 DIAGNOSIS — E119 Type 2 diabetes mellitus without complications: Secondary | ICD-10-CM

## 2020-05-01 DIAGNOSIS — Z23 Encounter for immunization: Secondary | ICD-10-CM

## 2020-05-01 DIAGNOSIS — R0602 Shortness of breath: Secondary | ICD-10-CM

## 2020-05-01 DIAGNOSIS — E1151 Type 2 diabetes mellitus with diabetic peripheral angiopathy without gangrene: Secondary | ICD-10-CM | POA: Diagnosis present

## 2020-05-01 DIAGNOSIS — J939 Pneumothorax, unspecified: Secondary | ICD-10-CM | POA: Diagnosis present

## 2020-05-01 DIAGNOSIS — F329 Major depressive disorder, single episode, unspecified: Secondary | ICD-10-CM | POA: Diagnosis present

## 2020-05-01 DIAGNOSIS — Y848 Other medical procedures as the cause of abnormal reaction of the patient, or of later complication, without mention of misadventure at the time of the procedure: Secondary | ICD-10-CM | POA: Diagnosis present

## 2020-05-01 DIAGNOSIS — I10 Essential (primary) hypertension: Secondary | ICD-10-CM | POA: Diagnosis present

## 2020-05-01 DIAGNOSIS — Z8249 Family history of ischemic heart disease and other diseases of the circulatory system: Secondary | ICD-10-CM

## 2020-05-01 DIAGNOSIS — Z87891 Personal history of nicotine dependence: Secondary | ICD-10-CM

## 2020-05-01 DIAGNOSIS — Z20822 Contact with and (suspected) exposure to covid-19: Secondary | ICD-10-CM | POA: Diagnosis present

## 2020-05-01 DIAGNOSIS — Z09 Encounter for follow-up examination after completed treatment for conditions other than malignant neoplasm: Secondary | ICD-10-CM

## 2020-05-01 DIAGNOSIS — Z833 Family history of diabetes mellitus: Secondary | ICD-10-CM

## 2020-05-01 DIAGNOSIS — J948 Other specified pleural conditions: Secondary | ICD-10-CM | POA: Diagnosis present

## 2020-05-01 DIAGNOSIS — E7800 Pure hypercholesterolemia, unspecified: Secondary | ICD-10-CM | POA: Diagnosis present

## 2020-05-01 DIAGNOSIS — Z923 Personal history of irradiation: Secondary | ICD-10-CM

## 2020-05-01 DIAGNOSIS — I739 Peripheral vascular disease, unspecified: Secondary | ICD-10-CM

## 2020-05-01 DIAGNOSIS — Z801 Family history of malignant neoplasm of trachea, bronchus and lung: Secondary | ICD-10-CM

## 2020-05-01 DIAGNOSIS — J449 Chronic obstructive pulmonary disease, unspecified: Secondary | ICD-10-CM | POA: Diagnosis not present

## 2020-05-01 DIAGNOSIS — Z9981 Dependence on supplemental oxygen: Secondary | ICD-10-CM

## 2020-05-01 DIAGNOSIS — Z83438 Family history of other disorder of lipoprotein metabolism and other lipidemia: Secondary | ICD-10-CM

## 2020-05-01 DIAGNOSIS — Z79899 Other long term (current) drug therapy: Secondary | ICD-10-CM

## 2020-05-01 DIAGNOSIS — I959 Hypotension, unspecified: Secondary | ICD-10-CM | POA: Diagnosis not present

## 2020-05-01 DIAGNOSIS — R000 Tachycardia, unspecified: Secondary | ICD-10-CM | POA: Diagnosis present

## 2020-05-01 DIAGNOSIS — C3431 Malignant neoplasm of lower lobe, right bronchus or lung: Secondary | ICD-10-CM | POA: Diagnosis present

## 2020-05-01 DIAGNOSIS — J9610 Chronic respiratory failure, unspecified whether with hypoxia or hypercapnia: Secondary | ICD-10-CM | POA: Diagnosis present

## 2020-05-01 DIAGNOSIS — Z7984 Long term (current) use of oral hypoglycemic drugs: Secondary | ICD-10-CM

## 2020-05-01 DIAGNOSIS — Z791 Long term (current) use of non-steroidal anti-inflammatories (NSAID): Secondary | ICD-10-CM

## 2020-05-01 LAB — COMPREHENSIVE METABOLIC PANEL
ALT: 32 U/L (ref 0–44)
AST: 26 U/L (ref 15–41)
Albumin: 4.1 g/dL (ref 3.5–5.0)
Alkaline Phosphatase: 72 U/L (ref 38–126)
Anion gap: 9 (ref 5–15)
BUN: 11 mg/dL (ref 8–23)
CO2: 28 mmol/L (ref 22–32)
Calcium: 9 mg/dL (ref 8.9–10.3)
Chloride: 100 mmol/L (ref 98–111)
Creatinine, Ser: 0.5 mg/dL (ref 0.44–1.00)
GFR calc Af Amer: >60 mL/min (ref >60)
GFR calc non Af Amer: >60 mL/min (ref >60)
Glucose, Bld: 148 mg/dL — ABNORMAL HIGH (ref 70–99)
Potassium: 4 mmol/L (ref 3.5–5.1)
Sodium: 137 mmol/L (ref 135–145)
Total Bilirubin: 0.7 mg/dL (ref 0.3–1.2)
Total Protein: 7.6 g/dL (ref 6.5–8.1)

## 2020-05-01 LAB — CBC WITH DIFFERENTIAL/PLATELET
Abs Immature Granulocytes: 0.04 10*3/uL (ref 0.00–0.07)
Basophils Absolute: 0.1 10*3/uL (ref 0.0–0.1)
Basophils Relative: 1 %
Eosinophils Absolute: 0.2 10*3/uL (ref 0.0–0.5)
Eosinophils Relative: 2 %
HCT: 35.7 % — ABNORMAL LOW (ref 36.0–46.0)
Hemoglobin: 10.5 g/dL — ABNORMAL LOW (ref 12.0–15.0)
Immature Granulocytes: 0 %
Lymphocytes Relative: 11 %
Lymphs Abs: 1.1 10*3/uL (ref 0.7–4.0)
MCH: 22.4 pg — ABNORMAL LOW (ref 26.0–34.0)
MCHC: 29.4 g/dL — ABNORMAL LOW (ref 30.0–36.0)
MCV: 76.3 fL — ABNORMAL LOW (ref 80.0–100.0)
Monocytes Absolute: 0.8 10*3/uL (ref 0.1–1.0)
Monocytes Relative: 8 %
Neutro Abs: 7.8 10*3/uL — ABNORMAL HIGH (ref 1.7–7.7)
Neutrophils Relative %: 78 %
Platelets: 342 10*3/uL (ref 150–400)
RBC: 4.68 MIL/uL (ref 3.87–5.11)
RDW: 28.1 % — ABNORMAL HIGH (ref 11.5–15.5)
Smear Review: NORMAL
WBC: 10 10*3/uL (ref 4.0–10.5)
nRBC: 0 % (ref 0.0–0.2)

## 2020-05-01 LAB — SARS CORONAVIRUS 2 BY RT PCR (HOSPITAL ORDER, PERFORMED IN ~~LOC~~ HOSPITAL LAB): SARS Coronavirus 2: NEGATIVE

## 2020-05-01 LAB — GLUCOSE, CAPILLARY
Glucose-Capillary: 183 mg/dL — ABNORMAL HIGH (ref 70–99)
Glucose-Capillary: 146 mg/dL — ABNORMAL HIGH (ref 70–99)
Glucose-Capillary: 103 mg/dL — ABNORMAL HIGH (ref 70–99)

## 2020-05-01 LAB — TROPONIN I (HIGH SENSITIVITY)
Troponin I (High Sensitivity): 7 ng/L (ref <18)
Troponin I (High Sensitivity): 8 ng/L (ref <18)

## 2020-05-01 LAB — HEMOGLOBIN A1C
Hgb A1c MFr Bld: 6.1 % — ABNORMAL HIGH (ref 4.8–5.6)
Mean Plasma Glucose: 128.37 mg/dL

## 2020-05-01 LAB — HIV ANTIBODY (ROUTINE TESTING W REFLEX): HIV Screen 4th Generation wRfx: NONREACTIVE

## 2020-05-01 MED ORDER — ASPIRIN EC 81 MG PO TBEC
81.0000 mg | DELAYED_RELEASE_TABLET | Freq: Every day | ORAL | Status: DC
  Administered 2020-05-01 – 2020-05-05 (×5): 81 mg via ORAL
  Filled 2020-05-01 (×5): qty 1

## 2020-05-01 MED ORDER — FENTANYL CITRATE (PF) 100 MCG/2ML IJ SOLN
INTRAMUSCULAR | Status: AC
Start: 2020-05-01 — End: 2020-05-02
  Filled 2020-05-01: qty 2

## 2020-05-01 MED ORDER — MORPHINE SULFATE (PF) 2 MG/ML IV SOLN
2.0000 mg | INTRAVENOUS | Status: DC | PRN
  Administered 2020-05-02 – 2020-05-04 (×7): 2 mg via INTRAVENOUS
  Filled 2020-05-01 (×10): qty 1

## 2020-05-01 MED ORDER — ACETAMINOPHEN 500 MG PO TABS: 1000.0000 mg | ORAL_TABLET | Freq: Once | ORAL | Status: DC

## 2020-05-01 MED ORDER — GABAPENTIN 100 MG PO CAPS
100.0000 mg | ORAL_CAPSULE | Freq: Two times a day (BID) | ORAL | Status: DC
  Administered 2020-05-01 – 2020-05-05 (×8): 100 mg via ORAL
  Filled 2020-05-01 (×9): qty 1

## 2020-05-01 MED ORDER — LISINOPRIL 10 MG PO TABS
10.0000 mg | ORAL_TABLET | Freq: Every day | ORAL | Status: DC
  Administered 2020-05-02: 10 mg via ORAL
  Filled 2020-05-01: qty 1

## 2020-05-01 MED ORDER — SODIUM CHLORIDE 0.9 % IV SOLN: 250.0000 mL | INTRAVENOUS | Status: DC | PRN

## 2020-05-01 MED ORDER — ALBUTEROL SULFATE (2.5 MG/3ML) 0.083% IN NEBU: 2.5000 mg | INHALATION_SOLUTION | Freq: Four times a day (QID) | RESPIRATORY_TRACT | Status: DC | PRN

## 2020-05-01 MED ORDER — ONDANSETRON HCL 4 MG PO TABS: 4.0000 mg | ORAL_TABLET | Freq: Four times a day (QID) | ORAL | Status: DC | PRN

## 2020-05-01 MED ORDER — ROPINIROLE HCL 1 MG PO TABS
0.5000 mg | ORAL_TABLET | Freq: Two times a day (BID) | ORAL | Status: DC
  Administered 2020-05-01 – 2020-05-05 (×8): 0.5 mg via ORAL
  Filled 2020-05-01 (×2): qty 1
  Filled 2020-05-01 (×2): qty 2
  Filled 2020-05-01 (×3): qty 1
  Filled 2020-05-01: qty 2
  Filled 2020-05-01: qty 1

## 2020-05-01 MED ORDER — BUPROPION HCL ER (XL) 150 MG PO TB24
150.0000 mg | ORAL_TABLET | Freq: Every day | ORAL | Status: DC
  Administered 2020-05-02 – 2020-05-05 (×4): 150 mg via ORAL
  Filled 2020-05-01 (×4): qty 1

## 2020-05-01 MED ORDER — SODIUM CHLORIDE 0.9% FLUSH
3.0000 mL | Freq: Two times a day (BID) | INTRAVENOUS | Status: DC
  Administered 2020-05-01 – 2020-05-02 (×3): 3 mL via INTRAVENOUS

## 2020-05-01 MED ORDER — FENTANYL CITRATE (PF) 100 MCG/2ML IJ SOLN
50.0000 ug | Freq: Once | INTRAMUSCULAR | Status: AC
  Administered 2020-05-01: 50 ug via INTRAVENOUS

## 2020-05-01 MED ORDER — AMLODIPINE BESYLATE 10 MG PO TABS
10.0000 mg | ORAL_TABLET | Freq: Every morning | ORAL | Status: DC
  Administered 2020-05-02: 10 mg via ORAL
  Filled 2020-05-01: qty 2

## 2020-05-01 MED ORDER — TIOTROPIUM BROMIDE MONOHYDRATE 18 MCG IN CAPS
18.0000 ug | ORAL_CAPSULE | Freq: Every day | RESPIRATORY_TRACT | Status: DC
  Administered 2020-05-02 – 2020-05-05 (×4): 18 ug via RESPIRATORY_TRACT
  Filled 2020-05-01 (×2): qty 5

## 2020-05-01 MED ORDER — LIDOCAINE-EPINEPHRINE 2 %-1:100000 IJ SOLN
20.0000 mL | Freq: Once | INTRAMUSCULAR | Status: AC
  Administered 2020-05-01: 20 mL
  Filled 2020-05-01: qty 1

## 2020-05-01 MED ORDER — ONDANSETRON HCL 4 MG/2ML IJ SOLN: 4.0000 mg | Freq: Four times a day (QID) | INTRAMUSCULAR | Status: DC | PRN

## 2020-05-01 MED ORDER — POTASSIUM CHLORIDE CRYS ER 10 MEQ PO TBCR
10.0000 meq | EXTENDED_RELEASE_TABLET | ORAL | Status: DC
  Administered 2020-05-02 – 2020-05-04 (×2): 10 meq via ORAL
  Filled 2020-05-01 (×4): qty 1

## 2020-05-01 MED ORDER — SODIUM CHLORIDE 0.9% FLUSH
3.0000 mL | INTRAVENOUS | Status: DC | PRN
  Administered 2020-05-02 – 2020-05-04 (×2): 3 mL via INTRAVENOUS

## 2020-05-01 MED ORDER — POTASSIUM CHLORIDE ER 10 MEQ PO TBCR: 10.0000 meq | EXTENDED_RELEASE_TABLET | Freq: Every day | ORAL | Status: DC

## 2020-05-01 MED ORDER — OXYCODONE-ACETAMINOPHEN 5-325 MG PO TABS
1.0000 | ORAL_TABLET | Freq: Once | ORAL | Status: AC
  Administered 2020-05-01: 1 via ORAL
  Filled 2020-05-01: qty 1

## 2020-05-01 MED ORDER — HYDROCHLOROTHIAZIDE 12.5 MG PO CAPS
12.5000 mg | ORAL_CAPSULE | Freq: Every day | ORAL | Status: DC
  Administered 2020-05-01 – 2020-05-02 (×2): 12.5 mg via ORAL
  Filled 2020-05-01 (×2): qty 1

## 2020-05-01 MED ORDER — ROSUVASTATIN CALCIUM 10 MG PO TABS
10.0000 mg | ORAL_TABLET | Freq: Every day | ORAL | Status: DC
  Administered 2020-05-04: 10 mg via ORAL
  Filled 2020-05-01 (×5): qty 1

## 2020-05-01 MED ORDER — FENTANYL CITRATE (PF) 100 MCG/2ML IJ SOLN
50.0000 ug | Freq: Once | INTRAMUSCULAR | Status: AC
  Administered 2020-05-01: 50 ug via INTRAVENOUS
  Filled 2020-05-01: qty 2

## 2020-05-01 MED ORDER — INSULIN ASPART 100 UNIT/ML ~~LOC~~ SOLN
0.0000 [IU] | Freq: Three times a day (TID) | SUBCUTANEOUS | Status: DC
  Administered 2020-05-02 (×2): 2 [IU] via SUBCUTANEOUS
  Administered 2020-05-03: 3 [IU] via SUBCUTANEOUS
  Administered 2020-05-03 – 2020-05-04 (×2): 2 [IU] via SUBCUTANEOUS
  Filled 2020-05-01 (×5): qty 1

## 2020-05-01 MED ORDER — CLOPIDOGREL BISULFATE 75 MG PO TABS
75.0000 mg | ORAL_TABLET | Freq: Every day | ORAL | Status: DC
  Administered 2020-05-01 – 2020-05-03 (×3): 75 mg via ORAL
  Filled 2020-05-01 (×3): qty 1

## 2020-05-01 MED ORDER — LISINOPRIL-HYDROCHLOROTHIAZIDE 10-12.5 MG PO TABS: 1.0000 | ORAL_TABLET | Freq: Every morning | ORAL | Status: DC

## 2020-05-01 MED ORDER — MOMETASONE FURO-FORMOTEROL FUM 200-5 MCG/ACT IN AERO
1.0000 | INHALATION_SPRAY | Freq: Two times a day (BID) | RESPIRATORY_TRACT | Status: DC
  Administered 2020-05-01 – 2020-05-05 (×8): 1 via RESPIRATORY_TRACT
  Filled 2020-05-01: qty 8.8

## 2020-05-01 NOTE — ED Provider Notes (Signed)
[facility] Emergency Department Provider Note ____________________________________________   First MD Initiated Contact with Patient 05/01/20 1252     (approximate)  I have reviewed the triage vital signs and the nursing notes.  HISTORY  Chief Complaint Shortness of Breath   HPI Angela Jensen is a 64 y.o. femalewho presents to the ED for evaluation of shortness of breath.   Chart review indicates hx COPD and right sided lung CA. history of previous RUL adenocarcinoma s/p radiation 2013. Wears as needed nightly oxygen 2 L.  Patient reports a CT-guided lung biopsy of her right lower lobe last week that, upon my chart review, has been reviewed by pathology and consistent with adenocarcinoma.  She reports doing well after this biopsy until the past 2 days she has noticed significantly worsened shortness of breath from baseline.  She reports difficulty ambulating to her bathroom due to the severity of her shortness of breath.  She reports she "eventually" gets back to baseline after being seated.  She denies any pain anywhere, particularly her chest or her abdomen or flank.  She denies increased cough or sputum production, her only complaint is dyspnea.    Past Medical History:  Diagnosis Date  . Arthritis   . Asthma   . Atherosclerosis of native arteries of extremity with intermittent claudication ([place]) 05/26/2016  . Cancer [facility]) 2012   Right Lung CA  . COPD (chronic obstructive pulmonary disease) ([place])   . Depression   . Diabetes mellitus without complication [facility])    Patient takes Janumet  . Essential hypertension 05/26/2016  . Hypercholesteremia   . Hypertension   . 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 PAD (peripheral artery disease) ([place]) 06/22/2016  . Peripheral vascular disease ([place])   . Personal history of radiation therapy   . Shortness of breath dyspnea    with exertion   . Sleep apnea     Patient Active Problem List   Diagnosis Date Noted   . Pneumothorax on right 05/01/2020  . Chest pain on breathing 04/21/2020  . Pleuritic chest pain 04/11/2020  . Right lower lobe pulmonary nodule 04/02/2020  . Iron deficiency anemia 04/02/2020  . Pica in adults 04/02/2020  . Goals of care, counseling/discussion 03/14/2020  . Acute non-recurrent pansinusitis 01/01/2020  . Pain in left wrist 09/24/2019  . Encounter for general adult medical examination with abnormal findings 06/28/2019  . Elevated total protein 01/22/2019  . Uncontrolled type 2 diabetes mellitus with hyperglycemia ([place]) 05/16/2018  . Chronic obstructive pulmonary disease ([place]) 05/16/2018  . Vitamin D deficiency 05/16/2018  . Flu vaccine need 05/16/2018  . Dysuria 05/16/2018  . Cancer of lower lobe of right lung ([place]) 01/21/2018  . Screening for breast cancer 10/12/2017  . Personal history of tobacco use, presenting hazards to health 02/03/2017  . PAD (peripheral artery disease) ([place]) 06/22/2016  . Essential hypertension 05/26/2016  . Diabetes ([place]) 05/26/2016  . Hyperlipidemia 05/26/2016  . Atherosclerosis of native arteries of extremity with intermittent claudication ([place]) 05/26/2016  . Uterine leiomyoma 04/30/2016  . Elevated CEA 01/31/2016  . Special screening for malignant neoplasms, colon   . Benign neoplasm of sigmoid colon   . Primary lung cancer ([place])   . Malignant neoplasm of upper lobe of right lung [facility])     Past Surgical History:  Procedure Laterality Date  . CESAREAN SECTION     x3  . COLONOSCOPY WITH PROPOFOL N/A 06/25/2015   Procedure: COLONOSCOPY WITH PROPOFOL;  Surgeon:  Lucilla Lame, MD;  Location: [facility] ENDOSCOPY;  Service: Endoscopy;  Laterality: N/A;  . CYST REMOVAL LEG     and on shoulder   . LOWER EXTREMITY ANGIOGRAPHY Left 09/29/2018   Procedure: LOWER EXTREMITY ANGIOGRAPHY;  Surgeon: Algernon Huxley, MD;  Location: [place] CV LAB;  Service: Cardiovascular;  Laterality: Left;  . LUNG BIOPSY  05 15 2013   has lung "spots"  . PERIPHERAL  VASCULAR CATHETERIZATION Left 06/01/2016   Procedure: Lower Extremity Angiography;  Surgeon: Algernon Huxley, MD;  Location: [place] CV LAB;  Service: Cardiovascular;  Laterality: Left;  . PERIPHERAL VASCULAR CATHETERIZATION N/A 06/01/2016   Procedure: Abdominal Aortogram w/Lower Extremity;  Surgeon: Algernon Huxley, MD;  Location: [place] CV LAB;  Service: Cardiovascular;  Laterality: N/A;  . PERIPHERAL VASCULAR CATHETERIZATION  06/01/2016   Procedure: Lower Extremity Intervention;  Surgeon: Algernon Huxley, MD;  Location: [place] CV LAB;  Service: Cardiovascular;;  . PERIPHERAL VASCULAR CATHETERIZATION Right 06/08/2016   Procedure: Lower Extremity Angiography;  Surgeon: Algernon Huxley, MD;  Location: [place] CV LAB;  Service: Cardiovascular;  Laterality: Right;  . PERIPHERAL VASCULAR CATHETERIZATION  06/08/2016   Procedure: Lower Extremity Intervention;  Surgeon: Algernon Huxley, MD;  Location: [place] CV LAB;  Service: Cardiovascular;;    Prior to Admission medications   Medication Sig Start Date End Date Taking? Authorizing Provider  Accu-Chek FastClix Lancets MISC Use as directed twice a day diag E11.65 03/15/19   Ronnell Freshwater, NP  albuterol (PROVENTIL) (2.5 MG/3ML) 0.083% nebulizer solution Take 3 mLs (2.5 mg total) by nebulization every 6 (six) hours as needed for wheezing. 10/05/19   Ronnell Freshwater, NP  ALPRAZolam Duanne Moron) 0.25 MG tablet May take 1/2 to 1 tablet po one time one to two hours prior to flight. May repeat dose in one hour as needed for persistent anxiety. Patient not taking: Reported on 04/10/2020 07/21/19   Ronnell Freshwater, NP  amLODipine (NORVASC) 10 MG tablet Take 1 tablet (10 mg total) by mouth every morning. 12/25/19   Ronnell Freshwater, NP  amoxicillin (AMOXIL) 875 MG tablet Take 1 tablet (875 mg total) by mouth 2 (two) times daily. Patient not taking: Reported on 04/24/2020 01/01/20   Ronnell Freshwater, NP  aspirin EC 81 MG tablet Take 1 tablet (81 mg  total) by mouth daily. 06/01/16   Algernon Huxley, MD  buPROPion (WELLBUTRIN XL) 150 MG 24 hr tablet TAKE 1 TABLET BY MOUTH TWICE DAILY FOR SMOKING CESSATION Patient taking differently: Take 150 mg by mouth in the morning and at bedtime. TAKE 1 TABLET BY MOUTH TWICE DAILY FOR SMOKING CESSATION 05/19/19   Ronnell Freshwater, NP  clopidogrel (PLAVIX) 75 MG tablet Take 1 tablet by mouth once daily 03/25/20   Kris Hartmann, NP  diclofenac Sodium (VOLTAREN) 1 % GEL Apply 4 g topically 4 (four) times daily. Patient not taking: Reported on 04/10/2020 09/22/19   Ronnell Freshwater, NP  gabapentin (NEURONTIN) 100 MG capsule Take 1 capsule by mouth twice daily Patient taking differently: Take 100 mg by mouth 2 (two) times daily.  01/17/20   Ronnell Freshwater, NP  glucose blood (ACCU-CHEK AVIVA PLUS) test strip Use as instructed to check blood sugars three times daily.  E11.65 03/16/19   Ronnell Freshwater, NP  JANUVIA 25 MG tablet Take 1 tablet by mouth once daily 02/26/20   Ronnell Freshwater, NP  lisinopril-hydrochlorothiazide (ZESTORETIC) 10-12.5 MG tablet Take 1 tablet by  mouth every morning. 09/07/19   Ronnell Freshwater, NP  meloxicam (MOBIC) 7.5 MG tablet Take 1 tablet (7.5 mg total) by mouth 2 (two) times daily. 08/21/19   Ronnell Freshwater, NP  metFORMIN (GLUCOPHAGE-XR) 500 MG 24 hr tablet Take 1 tablet (500 mg total) by mouth daily with breakfast. 01/29/20   Ronnell Freshwater, NP  mometasone-formoterol (DULERA) 200-5 MCG/ACT AERO Inhale 1 puff into the lungs 2 (two) times daily. Please fill as 90 day prescription 01/17/20   Ronnell Freshwater, NP  OXYGEN Inhale 2 L into the lungs at bedtime.    [provider]  potassium chloride (KLOR-CON) 10 MEQ tablet TAKE 1 TABLET BY MOUTH EVERY OTHER DAY FOR  LOW  POTASSIUM Patient taking differently: Take 10 mEq by mouth every other day. TAKE 1 TABLET BY MOUTH EVERY OTHER DAY FOR  LOW  POTASSIUM 04/10/20   Luiz Ochoa, NP  rOPINIRole (REQUIP) 0.5 MG tablet Take 1 tablet  (0.5 mg total) by mouth 2 (two) times daily. 03/21/20   Ronnell Freshwater, NP  rosuvastatin (CRESTOR) 10 MG tablet Take 1 tablet (10 mg total) by mouth at bedtime. 01/17/20   Boscia, Greer Ee, NP  SPIRIVA HANDIHALER 18 MCG inhalation capsule INHALE THE CONTENTS OF ONE CAPSULE ONCE DAILY. 02/18/20   [name], MD  VENTOLIN HFA 108 [phone] Base) MCG/ACT inhaler INHALE 2 PUFFS INTO LUNGS EVERY 4 HOURS AS NEEDED FOR WHEEZING OR SHORTNESS OF BREATH 10/31/19   Ronnell Freshwater, NP    Allergies Patient has no known allergies.  Family History  Problem Relation Age of Onset  . Lung cancer Father   . Diabetes Mother   . Hypercholesterolemia Mother   . Diabetes Sister   . Diabetes Maternal Grandmother   . Diabetes Paternal Grandmother   . Diabetes Sister   . Heart attack Brother   . Coronary artery disease Brother   . Vascular Disease Brother   . Hypertension Sister   . Heart attack Brother     Social History Social History   Tobacco Use  . Smoking status: Former Smoker    Packs/day: 1.00    Years: 37.00    Pack years: 37.00    Types: Cigarettes    Quit date: 02/06/2010    Years since quitting: 10.2  . Smokeless tobacco: Former Systems developer    Types: Snuff  Vaping Use  . Vaping Use: Never used  Substance Use Topics  . Alcohol use: Yes    Alcohol/week: 5.0 standard drinks    Types: 5 Cans of beer per week    Comment: /h x of alcohol abuse -stopped 2012- now drinks 5 beer per w  . Drug use: Not Currently    Types: Marijuana, "Crack" cocaine, Cocaine    Comment: hx of cocaine use- last use 2015; last use sat marijuana6/22/19,     Review of Systems  Constitutional: No fever/chills Eyes: No visual changes. ENT: No sore throat. Cardiovascular: Denies chest pain. Respiratory: Positive for shortness of breath. Gastrointestinal: No abdominal pain.  No nausea, no vomiting.  No diarrhea.  No constipation. Genitourinary: Negative for dysuria. Musculoskeletal: Negative for back pain. Skin:  Negative for rash. Neurological: Negative for headaches, focal weakness or numbness.   ____________________________________________   PHYSICAL EXAM:  VITAL SIGNS: Vitals:   05/01/20 1330 05/01/20 1430  BP: (!) 143/96 (!) 139/91  Pulse: 97 100  Resp:  (!) 22  Temp:    SpO2: 98% 97%      Constitutional:  Alert and oriented. Well appearing and in no acute distress.  Sitting up in bed on nasal cannula and speaking in full sentences. Eyes: Conjunctivae are normal. PERRL. EOMI. Head: Atraumatic. Nose: No congestion/rhinnorhea. Mouth/Throat: Mucous membranes are moist.  Oropharynx non-erythematous. Neck: No stridor. No cervical spine tenderness to palpation. Cardiovascular: Tachycardic rate, regular rhythm. Grossly normal heart sounds.  Good peripheral circulation. Respiratory:No retractions.  Tachypneic to the low 20s.  On nasal cannula.  Decreased breath sounds throughout the right lateral lung fields.  Otherwise clear. Gastrointestinal: Soft , nondistended, nontender to palpation. No abdominal bruits. No CVA tenderness. Musculoskeletal: No lower extremity tenderness nor edema.  No joint effusions. No signs of acute trauma. Neurologic:  Normal speech and language. No gross focal neurologic deficits are appreciated. No gait instability noted. Skin:  Skin is warm, dry and intact. No rash noted. Psychiatric: Mood and affect are normal. Speech and behavior are normal.  ____________________________________________   LABS (all labs ordered are listed, but only abnormal results are displayed)  Labs Reviewed  CBC WITH DIFFERENTIAL/PLATELET - Abnormal; Notable for the following components:      Result Value   Hemoglobin 10.5 (*)    HCT 35.7 (*)    MCV 76.3 (*)    MCH 22.4 (*)    MCHC 29.4 (*)    RDW 28.1 (*)    Neutro Abs 7.8 (*)    All other components within normal limits  COMPREHENSIVE METABOLIC PANEL - Abnormal; Notable for the following components:   Glucose, Bld 148 (*)     All other components within normal limits  GLUCOSE, CAPILLARY - Abnormal; Notable for the following components:   Glucose-Capillary 183 (*)    All other components within normal limits  SARS CORONAVIRUS 2 BY RT PCR (HOSPITAL ORDER, [place] [facility])  HIV ANTIBODY (ROUTINE TESTING W REFLEX)  HEMOGLOBIN A1C  TROPONIN I (HIGH SENSITIVITY)  TROPONIN I (HIGH SENSITIVITY)   ____________________________________________  12 Lead EKG  Sinus rhythm, rate of 108 bpm.  Normal axis intervals.  No evidence of acute ischemia. ____________________________________________  RADIOLOGY  ED MD interpretation: 2 view  Official radiology report(s): DG Chest 2 View  Result Date: 05/01/2020 CLINICAL DATA:  Shortness of breath worsening since yesterday. Lung biopsy 1 week ago. EXAM: CHEST - 2 VIEW COMPARISON:  04/24/2020 FINDINGS: Heart size is normal. Left chest is clear. Right pneumothorax, approximately 40%, without evidence of tension. No hemothorax. IMPRESSION: Right pneumothorax, approximately 40%, without evidence of tension. These results were called by telephone at the time of interpretation on 05/01/2020 at 10:19 am to provider PHILLIP STAFFORD , who verbally acknowledged these results. Electronically Signed   By: Nelson Chimes M.D.   On: 05/01/2020 10:19   DG Chest Portable 1 View  Result Date: 05/01/2020 CLINICAL DATA:  Chest tube for pneumothorax. EXAM: PORTABLE CHEST 1 VIEW COMPARISON:  05/01/2020 FINDINGS: Interval placement of right pigtail chest catheter in satisfactory position. Improvement in right pneumothorax which now is small in the apex. Minimal right pleural effusion. Right lower lobe atelectasis has improved aeration. Left lung remains clear. No heart failure. IMPRESSION: Satisfactory right chest tube with improvement in right pneumothorax. Residual small right apical pneumothorax. Improved aeration right lung base. Electronically Signed   By: Franchot Gallo M.D.   On:  05/01/2020 14:40    ____________________________________________   PROCEDURES and INTERVENTIONS  Procedure(s) performed (including Critical Care):  .Critical Care Performed by: Vladimir Crofts, MD Authorized by: Vladimir Crofts, MD   Critical care provider  statement:    Critical care time (minutes):  45   Critical care was necessary to treat or prevent imminent or life-threatening deterioration of the following conditions:  Circulatory failure, respiratory failure and cardiac failure   Critical care was time spent personally by me on the following activities:  Discussions with consultants, evaluation of patient's response to treatment, examination of patient, ordering and performing treatments and interventions, ordering and review of laboratory studies, ordering and review of radiographic studies, pulse oximetry, re-evaluation of patient's condition, obtaining history from patient or surrogate and review of old charts CHEST TUBE INSERTION  Date/Time: 05/01/2020 3:22 PM Performed by: Vladimir Crofts, MD Authorized by: Vladimir Crofts, MD   Consent:    Consent obtained:  Verbal   Consent given by:  Patient   Risks discussed:  Bleeding, incomplete drainage, pain, infection and damage to surrounding structures   Alternatives discussed:  No treatment Pre-procedure details:    Skin preparation:  ChloraPrep   Preparation: Patient was prepped and draped in the usual sterile fashion   Sedation:    Sedation type:  Anxiolysis Anesthesia (see MAR for exact dosages):    Anesthesia method:  Local infiltration   Local anesthetic:  Lidocaine 1% WITH epi Procedure details:    Placement location:  R lateral   Tube size [place]): 14 Fr Pigtail.   Ultrasound guidance: no     Tension pneumothorax: no     Tube connected to:  Suction   Drainage characteristics:  Air only   Suture material:  0 silk   Dressing:  4x4 sterile gauze (Large tegaderm) Post-procedure details:    Post-insertion x-ray findings:  tube in good position     Patient tolerance of procedure:  Tolerated well, no immediate complications Comments:     14 French pigtail chest tube placed via Seldinger technique to the fourth intercostal space to the right anterior axillary line.     Medications  acetaminophen (TYLENOL) tablet 1,000 mg (0 mg Oral Hold 05/01/20 1507)  aspirin EC tablet 81 mg (has no administration in time range)  amLODipine (NORVASC) tablet 10 mg (has no administration in time range)  lisinopril-hydrochlorothiazide (ZESTORETIC) 10-12.5 MG per tablet 1 tablet (has no administration in time range)  rosuvastatin (CRESTOR) tablet 10 mg (has no administration in time range)  buPROPion (WELLBUTRIN XL) 24 hr tablet 150 mg (has no administration in time range)  clopidogrel (PLAVIX) tablet 75 mg (has no administration in time range)  gabapentin (NEURONTIN) capsule 100 mg (has no administration in time range)  rOPINIRole (REQUIP) tablet 0.5 mg (has no administration in time range)  potassium chloride (KLOR-CON) CR tablet 10 mEq (has no administration in time range)  albuterol (PROVENTIL) (2.5 MG/3ML) 0.083% nebulizer solution 2.5 mg (has no administration in time range)  mometasone-formoterol (DULERA) 200-5 MCG/ACT inhaler 1 puff (has no administration in time range)  tiotropium (SPIRIVA) inhalation capsule ([facility] use ONLY) 18 mcg (has no administration in time range)  sodium chloride flush (NS) 0.9 % injection 3 mL (has no administration in time range)  sodium chloride flush (NS) 0.9 % injection 3 mL (has no administration in time range)  0.9 %  sodium chloride infusion (has no administration in time range)  morphine 2 MG/ML injection 2 mg (has no administration in time range)  ondansetron (ZOFRAN) tablet 4 mg (has no administration in time range)    Or  ondansetron (ZOFRAN) injection 4 mg (has no administration in time range)  insulin aspart (novoLOG) injection 0-15 Units (has no administration in  time range)   fentaNYL (SUBLIMAZE) injection 50 mcg (50 mcg Intravenous Given 05/01/20 1321)  lidocaine-EPINEPHrine (XYLOCAINE W/EPI) 2 %-1:100000 (with pres) injection 20 mL (20 mLs Infiltration Given 05/01/20 1356)  fentaNYL (SUBLIMAZE) injection 50 mcg ( Intravenous See Procedure Record 05/01/20 1418)  oxyCODONE-acetaminophen (PERCOCET/ROXICET) 5-325 MG per tablet 1 tablet (1 tablet Oral Given 05/01/20 1506)    ____________________________________________   MDM / ED COURSE  64 year old woman with history of COPD presents 1 week after CT-guided lung biopsy with pneumothorax requiring bedside pigtail chest tube placement and medical admission.  Patient presented tachycardic and required 2 L oxygen nasal cannula, otherwise hemodynamically stable.  CXR confirms moderately-large right-sided pneumothorax without tension physiology.  Blood work unremarkable.  Due to her tachycardia, discomfort and O2 requirement, bedside chest tube was placed to drain this pneumothorax, as documented above, and performed without complication.  Follow-up CXR with improving pneumothorax with only residual apical.  Consult to CT surgery, who reviewed his case and images, agrees with management and recommends continued 20 cm of water continuous suction.  Will admit the patient to hospitalist medicine for further work-up and management.  Clinical Course as of May 02 1523  Wed May 01, 2020  1419 Pigtail complete   [DS]  1458 Spoke with admitting hospitalist, Dr. Francine Graven, who requests I consult to CT surgery.  Dr. Faith Rogue paged.   [DS]  1501 Immediate callback from Dr. Genevive Bi, who reviews chart and chest Xrs, continuous 20cm h20   [DS]    Clinical Course User Index [DS] Vladimir Crofts, MD     ____________________________________________   FINAL CLINICAL IMPRESSION(S) / ED DIAGNOSES  Final diagnoses:  Pneumothorax after biopsy  Shortness of breath     ED Discharge Orders    None       Dolph Tavano Tamala Julian   Note:  This document  was prepared using Dragon voice recognition software and may include unintentional dictation errors.   Vladimir Crofts, MD 05/01/20 [id number]

## 2020-05-01 NOTE — H&P (Addendum)
History and Physical    Angela Jensen ION:[id number] DOB: [birth year] DOA: 05/01/2020  PCP: Ronnell Freshwater, NP   Patient coming from: Home  I have personally briefly reviewed patient's old medical records in [place]  Chief Complaint: Shortness of breath  HPI: Angela Jensen is a 64 y.o. female with medical history significant for COPD, chronic respiratory failure on 2L of oxygen at night, hitsory of Lung CA (  RUL adenocarcinoma s/p radiation 2013.), depressison, DM and PAD who presents to the ER for evaluation of SOB. Patient reports a CT-guided lung biopsy of her right lower lobe on 04/24/20   pathology is consistent with adenocarcinoma.  She reports doing well after this biopsy until the past 2 days when she  noticed significantly worsened shortness of breath from baseline.  She reports difficulty ambulating to her bathroom due to the severity of her shortness of breath.  She reports she "eventually" gets back to baseline after being seated.  She denies any pain anywhere, particularly her chest or her abdomen or flank.  She denies increased cough or sputum production, her only complaint is dyspnea. Labs show sodium 137, potasium 4.0, chloride 100, BUN 11, creatinine 0.50, AST 26, ALT 32, WBC 10, Hemoglobin 10.5, HCT 35.7, Platelets 342, CXR reviewed by me shows right pneumothorax, approximately 40%, without evidence of tension. 12 Lead EKG reviewed by me shows sinus tachycardia    ED Course: Patient is a 64 year old female who presents for evaluation of SOB following a CT guided lung biopsy. Patient found to have a right pneumothorax and had a pigtail inserted in the ER. She will be referred to observation status for further evaluation  Review of Systems: As per HPI otherwise 10 point review of systems negative.    Past Medical History:  Diagnosis Date  . Arthritis   . Asthma   . Atherosclerosis of native arteries of extremity with intermittent claudication ([place]) 05/26/2016   . Cancer [facility]) 2012   Right Lung CA  . COPD (chronic obstructive pulmonary disease) ([place])   . Depression   . Diabetes mellitus without complication [facility])    Patient takes Janumet  . Essential hypertension 05/26/2016  . Hypercholesteremia   . Hypertension   . 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 PAD (peripheral artery disease) ([place]) 06/22/2016  . Peripheral vascular disease ([place])   . Personal history of radiation therapy   . Shortness of breath dyspnea    with exertion   . Sleep apnea     Past Surgical History:  Procedure Laterality Date  . CESAREAN SECTION     x3  . COLONOSCOPY WITH PROPOFOL N/A 06/25/2015   Procedure: COLONOSCOPY WITH PROPOFOL;  Surgeon: Lucilla Lame, MD;  Location: [facility] ENDOSCOPY;  Service: Endoscopy;  Laterality: N/A;  . CYST REMOVAL LEG     and on shoulder   . LOWER EXTREMITY ANGIOGRAPHY Left 09/29/2018   Procedure: LOWER EXTREMITY ANGIOGRAPHY;  Surgeon: Algernon Huxley, MD;  Location: [place] CV LAB;  Service: Cardiovascular;  Laterality: Left;  . LUNG BIOPSY  05 15 2013   has lung "spots"  . PERIPHERAL VASCULAR CATHETERIZATION Left 06/01/2016   Procedure: Lower Extremity Angiography;  Surgeon: Algernon Huxley, MD;  Location: [place] [facility];  Service: Cardiovascular;  Laterality: Left;  . PERIPHERAL VASCULAR CATHETERIZATION N/A 06/01/2016   Procedure: Abdominal Aortogram w/Lower Extremity;  Surgeon: Algernon Huxley, MD;  Location: [place] [facility] LAB;  Service: Cardiovascular;  Laterality: N/A;  . PERIPHERAL VASCULAR CATHETERIZATION  06/01/2016   Procedure: Lower Extremity Intervention;  Surgeon: Algernon Huxley, MD;  Location: [place] [facility];  Service: Cardiovascular;;  . PERIPHERAL VASCULAR CATHETERIZATION Right 06/08/2016   Procedure: Lower Extremity Angiography;  Surgeon: Algernon Huxley, MD;  Location: [place] [facility] LAB;  Service: Cardiovascular;  Laterality: Right;  . PERIPHERAL VASCULAR CATHETERIZATION  06/08/2016   Procedure: Lower Extremity  Intervention;  Surgeon: Algernon Huxley, MD;  Location: [place] [facility];  Service: Cardiovascular;;     reports that she quit smoking about 10 years ago. Her smoking use included cigarettes. She has a 37.00 pack-year smoking history. She has quit using smokeless tobacco.  Her smokeless tobacco use included snuff. She reports current alcohol use of about 5.0 standard drinks of alcohol per week. She reports previous drug use. Drugs: Marijuana, "Crack" cocaine, and Cocaine.  No Known Allergies  Family History  Problem Relation Age of Onset  . Lung cancer Father   . Diabetes Mother   . Hypercholesterolemia Mother   . Diabetes Sister   . Diabetes Maternal Grandmother   . Diabetes Paternal Grandmother   . Diabetes Sister   . Heart attack Brother   . Coronary artery disease Brother   . Vascular Disease Brother   . Hypertension Sister   . Heart attack Brother      Prior to Admission medications   Medication Sig Start Date End Date Taking? Authorizing Provider  Accu-Chek FastClix Lancets MISC Use as directed twice a day diag E11.65 03/15/19  Yes Boscia, Heather E, NP  albuterol (PROVENTIL) (2.5 MG/3ML) 0.083% nebulizer solution Take 3 mLs (2.5 mg total) by nebulization every 6 (six) hours as needed for wheezing. 10/05/19  Yes Boscia, Heather E, NP  amLODipine (NORVASC) 10 MG tablet Take 1 tablet (10 mg total) by mouth every morning. 12/25/19  Yes Ronnell Freshwater, NP  aspirin EC 81 MG tablet Take 1 tablet (81 mg total) by mouth daily. 06/01/16  Yes Dew, Erskine Squibb, MD  buPROPion (WELLBUTRIN XL) 150 MG 24 hr tablet TAKE 1 TABLET BY MOUTH TWICE DAILY FOR SMOKING CESSATION Patient taking differently: Take 150 mg by mouth in the morning and at bedtime.  05/19/19  Yes Ronnell Freshwater, NP  clopidogrel (PLAVIX) 75 MG tablet Take 1 tablet by mouth once daily Patient taking differently: Take 75 mg by mouth daily.  03/25/20  Yes Kris Hartmann, NP  gabapentin (NEURONTIN) 100 MG capsule Take 1 capsule by  mouth twice daily Patient taking differently: Take 100 mg by mouth 2 (two) times daily.  01/17/20  Yes Boscia, Heather E, NP  glucose blood (ACCU-CHEK AVIVA PLUS) test strip Use as instructed to check blood sugars three times daily.  E11.65 03/16/19  Yes Boscia, Heather E, NP  JANUVIA 25 MG tablet Take 1 tablet by mouth once daily Patient taking differently: Take 25 mg by mouth daily.  02/26/20  Yes Ronnell Freshwater, NP  lisinopril-hydrochlorothiazide (ZESTORETIC) 10-12.5 MG tablet Take 1 tablet by mouth every morning. 09/07/19  Yes Boscia, Heather E, NP  meloxicam (MOBIC) 7.5 MG tablet Take 1 tablet (7.5 mg total) by mouth 2 (two) times daily. 08/21/19  Yes Ronnell Freshwater, NP  metFORMIN (GLUCOPHAGE-XR) 500 MG 24 hr tablet Take 1 tablet (500 mg total) by mouth daily with breakfast. 01/29/20  Yes Boscia, Heather E, NP  mometasone-formoterol (DULERA) 200-5 MCG/ACT AERO Inhale 1 puff into the lungs 2 (two) times daily. Please fill as  90 day prescription 01/17/20  Yes Boscia, Heather E, NP  potassium chloride (KLOR-CON) 10 MEQ tablet TAKE 1 TABLET BY MOUTH EVERY OTHER DAY FOR  LOW  POTASSIUM Patient taking differently: Take 10 mEq by mouth every other day.  04/10/20  Yes Luiz Ochoa, NP  rOPINIRole (REQUIP) 0.5 MG tablet Take 1 tablet (0.5 mg total) by mouth 2 (two) times daily. 03/21/20  Yes Boscia, Greer Ee, NP  SPIRIVA HANDIHALER 18 MCG inhalation capsule INHALE THE CONTENTS OF ONE CAPSULE ONCE DAILY. Patient taking differently: Place 18 mcg into inhaler and inhale daily.  02/18/20  Yes [name], MD  VENTOLIN HFA 108 (90 Base) MCG/ACT inhaler INHALE 2 PUFFS INTO LUNGS EVERY 4 HOURS AS NEEDED FOR WHEEZING OR SHORTNESS OF BREATH Patient taking differently: Inhale 2 puffs into the lungs every 4 (four) hours as needed for wheezing or shortness of breath.  10/31/19  Yes Ronnell Freshwater, NP    Physical Exam: Vitals:   05/01/20 1630 05/01/20 1700 05/01/20 1706 05/01/20 1730  BP: (!) 138/94 122/88  122/88 (!) 129/94  Pulse: 95 95 98 94  Resp:   19   Temp:      TempSrc:      SpO2: 97% 98% 99% 100%  Weight:      Height:         Vitals:   05/01/20 1630 05/01/20 1700 05/01/20 1706 05/01/20 1730  BP: (!) 138/94 122/88 122/88 (!) 129/94  Pulse: 95 95 98 94  Resp:   19   Temp:      TempSrc:      SpO2: 97% 98% 99% 100%  Weight:      Height:        Constitutional: NAD, alert and oriented x 3. Appears uncomfortable Eyes: PERRL, lids and conjunctivae pallor ENMT: Mucous membranes are moist.  Neck: normal, supple, no masses, no thyromegaly Respiratory: decreased air entry in the right lung,  no wheezing, no crackles. Normal respiratory effort. No accessory muscle use.  Cardiovascular:  Tachycardia ,no murmurs / rubs / gallops. No extremity edema. 2+ pedal pulses. No carotid bruits. Chest tube in place connected to suction Abdomen: no tenderness, no masses palpated. No hepatosplenomegaly. Bowel sounds positive.  Musculoskeletal: no clubbing / cyanosis. No joint deformity upper and lower extremities.  Skin: no rashes, lesions, ulcers.  Neurologic: No gross focal neurologic deficit. Psychiatric: Normal mood and affect.   Labs on Admission: I have personally reviewed following labs and imaging studies  CBC: Recent Labs  Lab 04/29/20 1354 05/01/20 0949  WBC 10.6* 10.0  NEUTROABS  --  7.8*  HGB 10.9* 10.5*  HCT 36.2 35.7*  MCV 75.7* 76.3*  PLT 445* 195   Basic Metabolic Panel: Recent Labs  Lab 05/01/20 0949  NA 137  K 4.0  CL 100  CO2 28  GLUCOSE 148*  BUN 11  CREATININE 0.50  CALCIUM 9.0   GFR: Estimated Creatinine Clearance: 48.5 mL/min (by C-G formula based on SCr of 0.5 mg/dL). Liver Function Tests: Recent Labs  Lab 05/01/20 0949  AST 26  ALT 32  ALKPHOS 72  BILITOT 0.7  PROT 7.6  ALBUMIN 4.1   No results for input(s): LIPASE, AMYLASE in the last 168 hours. No results for input(s): AMMONIA in the last 168 hours. Coagulation Profile: No results  for input(s): INR, PROTIME in the last 168 hours. Cardiac Enzymes: No results for input(s): CKTOTAL, CKMB, CKMBINDEX, TROPONINI in the last 168 hours. BNP (last 3 results) No results for  input(s): PROBNP in the last 8760 hours. HbA1C: No results for input(s): HGBA1C in the last 72 hours. CBG: Recent Labs  Lab 05/01/20 1448 05/01/20 1657  GLUCAP 183* 146*   Lipid Profile: No results for input(s): CHOL, HDL, LDLCALC, TRIG, CHOLHDL, LDLDIRECT in the last 72 hours. Thyroid Function Tests: No results for input(s): TSH, T4TOTAL, FREET4, T3FREE, THYROIDAB in the last 72 hours. Anemia Panel: No results for input(s): VITAMINB12, FOLATE, FERRITIN, TIBC, IRON, RETICCTPCT in the last 72 hours. Urine analysis:    Component Value Date/Time   APPEARANCEUR Clear 01/01/2020 1402   GLUCOSEU Negative 01/01/2020 1402   BILIRUBINUR Negative 01/01/2020 1402   PROTEINUR Trace 01/01/2020 1402   NITRITE Negative 01/01/2020 1402   LEUKOCYTESUR Negative 01/01/2020 1402    Radiological Exams on Admission: DG Chest 2 View  Result Date: 05/01/2020 CLINICAL DATA:  Shortness of breath worsening since yesterday. Lung biopsy 1 week ago. EXAM: CHEST - 2 VIEW COMPARISON:  04/24/2020 FINDINGS: Heart size is normal. Left chest is clear. Right pneumothorax, approximately 40%, without evidence of tension. No hemothorax. IMPRESSION: Right pneumothorax, approximately 40%, without evidence of tension. These results were called by telephone at the time of interpretation on 05/01/2020 at 10:19 am to provider PHILLIP STAFFORD , who verbally acknowledged these results. Electronically Signed   By: Nelson Chimes M.D.   On: 05/01/2020 10:19   DG Chest Portable 1 View  Result Date: 05/01/2020 CLINICAL DATA:  Chest tube for pneumothorax. EXAM: PORTABLE CHEST 1 VIEW COMPARISON:  05/01/2020 FINDINGS: Interval placement of right pigtail chest catheter in satisfactory position. Improvement in right pneumothorax which now is small in  the apex. Minimal right pleural effusion. Right lower lobe atelectasis has improved aeration. Left lung remains clear. No heart failure. IMPRESSION: Satisfactory right chest tube with improvement in right pneumothorax. Residual small right apical pneumothorax. Improved aeration right lung base. Electronically Signed   By: Franchot Gallo M.D.   On: 05/01/2020 14:40    EKG: Independently reviewed.  Sinus tachycardia  Assessment/Plan Principal Problem:   Pneumothorax on right Active Problems:   Essential hypertension   Diabetes (HCC)   PAD (peripheral artery disease) (HCC)   Cancer of lower lobe of right lung (HCC)   Chronic obstructive pulmonary disease (H[facility])   Pneumothorax of rght lung Following a procedure, patient is s/p CT guided lung biopsy done on 04/24/20 Pig tail was inserted in the ER CT surgery evaluation   Diabetes Mellitus Maintain carb consistent diet Glycemic control with sliding scale insulin     HTN Continue Lisisnopril/HCTZ and amlodipine   PAD Stable  Continue statin, Plavix and Aspirin  COPD Not in acute exacerbation Continue PRN bronchodilator therapy, spiriva and inhaled steroids   Depression Stable Continue Bupropion   Lung CA Patient is s/p CT guided biopsy of right lower lung nodule and surgical pathology confirms - ADENOCARCINOMA OF THE LUNG, PAPILLARY AND SOLID PATTERNS.  Follow up with oncology as an out patient   DVT prophylaxis: SCD Code Status: Full code Family Communication: Greater than 50% of time was spent discussisng plan of care with patient at the bedside. She verbalizes understanding and agrees with the plan Disposition Plan: Back to previous home environment Consults called: CT susrgery    Crespin Forstrom MD [facility]     05/01/2020, 6:10 PM

## 2020-05-01 NOTE — ED Triage Notes (Signed)
Pt with history of lung cancer, copd that has had increasing shob over several days. Pt appears in no acute distress, is on oxygen at home 2lpm.

## 2020-05-02 ENCOUNTER — Observation Stay: Payer: BLUE CROSS/BLUE SHIELD

## 2020-05-02 ENCOUNTER — Other Ambulatory Visit: Payer: Self-pay

## 2020-05-02 DIAGNOSIS — J939 Pneumothorax, unspecified: Secondary | ICD-10-CM | POA: Diagnosis not present

## 2020-05-02 DIAGNOSIS — C3431 Malignant neoplasm of lower lobe, right bronchus or lung: Secondary | ICD-10-CM

## 2020-05-02 LAB — CBC
HCT: 34.5 % — ABNORMAL LOW (ref 36.0–46.0)
Hemoglobin: 10.1 g/dL — ABNORMAL LOW (ref 12.0–15.0)
MCH: 22.9 pg — ABNORMAL LOW (ref 26.0–34.0)
MCHC: 29.3 g/dL — ABNORMAL LOW (ref 30.0–36.0)
MCV: 78.1 fL — ABNORMAL LOW (ref 80.0–100.0)
Platelets: 314 10*3/uL (ref 150–400)
RBC: 4.42 MIL/uL (ref 3.87–5.11)
WBC: 8.2 10*3/uL (ref 4.0–10.5)
nRBC: 0 % (ref 0.0–0.2)

## 2020-05-02 LAB — BASIC METABOLIC PANEL
Anion gap: 7 (ref 5–15)
BUN: 15 mg/dL (ref 8–23)
CO2: 29 mmol/L (ref 22–32)
Calcium: 9 mg/dL (ref 8.9–10.3)
Chloride: 98 mmol/L (ref 98–111)
Creatinine, Ser: 0.59 mg/dL (ref 0.44–1.00)
GFR calc Af Amer: >60 mL/min (ref >60)
GFR calc non Af Amer: >60 mL/min (ref >60)
Glucose, Bld: 123 mg/dL — ABNORMAL HIGH (ref 70–99)
Potassium: 4.1 mmol/L (ref 3.5–5.1)
Sodium: 134 mmol/L — ABNORMAL LOW (ref 135–145)

## 2020-05-02 LAB — GLUCOSE, CAPILLARY
Glucose-Capillary: 124 mg/dL — ABNORMAL HIGH (ref 70–99)
Glucose-Capillary: 132 mg/dL — ABNORMAL HIGH (ref 70–99)
Glucose-Capillary: 114 mg/dL — ABNORMAL HIGH (ref 70–99)
Glucose-Capillary: 129 mg/dL — ABNORMAL HIGH (ref 70–99)

## 2020-05-02 MED ORDER — SODIUM CHLORIDE 0.9 % IV SOLN: Freq: Once | INTRAVENOUS | Status: AC

## 2020-05-02 MED ORDER — SODIUM CHLORIDE 0.9 % IV SOLN: INTRAVENOUS | Status: DC

## 2020-05-02 MED ORDER — SODIUM CHLORIDE 0.9 % IV BOLUS
250.0000 mL | Freq: Once | INTRAVENOUS | Status: AC
  Administered 2020-05-02: 250 mL via INTRAVENOUS

## 2020-05-02 NOTE — ED Notes (Signed)
Pt assisted with bedpan. Pt states she does not want to use purewic. Pt encouraged to use call bell when she needs to use the bedpan. Brief changed, pericare done.

## 2020-05-02 NOTE — ED Notes (Signed)
Verbal order received for IV fluids for patient.

## 2020-05-02 NOTE — ED Notes (Signed)
Patient BP noted to decreasing, patient remains alert and asymptotic at this time, BP 80/52, MD notified and awaiting orders.

## 2020-05-02 NOTE — ED Notes (Signed)
Admitting provider at bedside.

## 2020-05-02 NOTE — ED Notes (Signed)
bg 114

## 2020-05-02 NOTE — Progress Notes (Signed)
PROGRESS NOTE    Patient: Angela Jensen                            PCP: Ronnell Freshwater, NP                    DOB: [birth year]            DOA: 05/01/2020 VQQ:[id number]             DOS: 05/02/2020, 8:07 AM   LOS: 0 days   Date of Service: The patient was seen and examined on 05/02/2020  Subjective:   The patient was seen and examined this Am. Hemodynamically stable, complaining of right-sided chest discomfort, improved shortness of breath Chest tube present on right side Otherwise no issues overnight .  Brief Narrative:   Per HPI:  Angela Jensen is a 64 y.o. 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 Patient reports a CT-guided lung biopsy of her right lower lobe on 04/24/20--   pathology is consistent with adenocarcinoma.  She reports doing well after this biopsy until the past 2 days when she  noticed significantly worsened shortness of breath from baseline. She reports difficulty ambulating to her bathroom due to the severity of her shortness of breath. She reports she "eventually" gets back to baseline after being seated. She denies any pain anywhere, particularly her chest or her abdomen or flank. She denies increased cough or sputum production, her only complaint is dyspnea.    ED Course:  Patient found to have a right pneumothorax and had a pigtail inserted in the ER.  She will be referred to observation status for further evaluation. Labs show sodium 137, potasium 4.0, chloride 100, BUN 11, creatinine 0.50, AST 26, ALT 32, WBC 10, Hemoglobin 10.5, HCT 35.7, Platelets 342, CXR reviewed by me shows right pneumothorax, approximately 40%, without evidence of tension. 12 Lead EKG reviewed by me shows sinus tachycardia   Assessment & Plan:   Principal Problem:   Pneumothorax on right Active Problems:   Essential  hypertension   Diabetes (HCC)   PAD (peripheral artery disease) ([place])   Cancer of lower lobe of right lung (HCC)   Chronic obstructive pulmonary disease (HCC)  Pneumothorax of rght lung Right pneumothorax, approximately 40%, without evidence of tension -Right-sided chest tube in place -placed on night of 05/02/2020  Following a procedure, patient is s/p CT guided lung biopsy done on 04/24/20 -Chest tube remains in the right side CT surgery consulted, following closely-recommending no further intervention continue with current chest tube,   with close monitoring -Daily chest x-ray -Continue O2 supplements maintaining O2 sat greater 92%  Diabetes Mellitus -Diabetic diet -Checking CBG QA CHS, SSI coverage    HTN Monitoring, stable  continue Lisisnopril/HCTZ and amlodipine   PAD Remained stable Continue statin, Plavix and Aspirin  COPD No signs of acute exacerbation Continue PRN bronchodilator therapy, spiriva and inhaled steroids   Depression Stable no acute distress Continue Bupropion   Lung CA Patient is s/p CT guided biopsy of right lower lung nodule and surgical pathology confirms - ADENOCARCINOMA OF THE LUNG, PAPILLARY AND SOLID PATTERNS.  Follow up with oncology as an out patient -Stable will follow with primary oncologist as an outpatient   DVT prophylaxis: SCD Code Status: Full  code Family Communication:   No family member present at bedsides  The above findings and plan of care has been discussed with patient (and family )  in detail,  they expressed understanding and agreement of above. -Advance care planning has been discussed.     Disposition Plan: Back to previous home environment Consults called: CT susrgery    Procedures:   No admission procedures for hospital encounter.     Antimicrobials:  Anti-infectives (From admission, onward)   None       Medication:  . acetaminophen  1,000 mg Oral Once  . amLODipine  10 mg  Oral q morning - 10a  . aspirin EC  81 mg Oral Daily  . buPROPion  150 mg Oral Daily  . clopidogrel  75 mg Oral Daily  . gabapentin  100 mg Oral BID  . lisinopril  10 mg Oral Daily   And  . hydrochlorothiazide  12.5 mg Oral Daily  . insulin aspart  0-15 Units Subcutaneous TID WC  . mometasone-formoterol  1 puff Inhalation BID  . potassium chloride  10 mEq Oral QODAY  . rOPINIRole  0.5 mg Oral BID  . rosuvastatin  10 mg Oral QHS  . sodium chloride flush  3 mL Intravenous Q12H  . tiotropium  18 mcg Inhalation Daily    sodium chloride, albuterol, morphine injection, ondansetron **OR** ondansetron (ZOFRAN) IV, sodium chloride flush   Objective:   Vitals:   05/01/20 2317 05/02/20 0301 05/02/20 0530 05/02/20 0606  BP: (!) 142/88 (!) 138/99 127/90 112/87  Pulse: 99 98 92 (!) 105  Resp: 16 17  18   Temp:      TempSrc:      SpO2: 99% 99% 99% 97%  Weight:      Height:       No intake or output data in the 24 hours ending 05/02/20 0807 Filed Weights   05/01/20 0938  Weight: 49.9 kg     Examination:   Physical Exam  Constitution:  Alert, cooperative, no distress,  Appears calm and comfortable  Psychiatric: Normal and stable mood and affect, cognition intact,   HEENT: Normocephalic, PERRL, otherwise with in Normal limits  Chest:Chest symmetric Cardio vascular:  S1/S2, RRR, No murmure, No Rubs or Gallops  pulmonary: Chest tube flank area right side clear to auscultation bilaterally, respirations unlabored, negative wheezes / crackles Abdomen: Soft, non-tender, non-distended, bowel sounds,no masses, no organomegaly Muscular skeletal: Limited exam - in bed, able to move all 4 extremities, Normal strength,  Neuro: CNII-XII intact. , normal motor and sensation, reflexes intact  Extremities: No pitting edema lower extremities, +2 pulses  Skin: Dry, warm to touch, negative for any Rashes, No open wounds Wounds: per nursing documentation     ------------------------------------------------------------------------------------------------------------------------------------------    LABs:  CBC Latest Ref Rng & Units 05/02/2020 05/01/2020 04/29/2020  WBC 4.0 - 10.5 K/uL 8.2 10.0 10.6(H)  Hemoglobin 12.0 - 15.0 g/dL 10.1(L) 10.5(L) 10.9(L)  Hematocrit 36 - 46 % 34.5(L) 35.7(L) 36.2  Platelets 150 - 400 K/uL 314 342 445(H)   CMP Latest Ref Rng & Units 05/02/2020 05/01/2020 04/11/2020  Glucose 70 - 99 mg/dL 123(H) 148(H) 105(H)  BUN 8 - 23 mg/dL 15 11 14   Creatinine 0.44 - 1.00 mg/dL 0.59 0.50 0.64  Sodium 135 - 145 mmol/L 134(L) 137 136  Potassium 3.5 - 5.1 mmol/L 4.1 4.0 3.5  Chloride 98 - 111 mmol/L 98 100 100  CO2 22 - 32 mmol/L 29 28 27   Calcium 8.9 - 10.3 mg/dL  9.0 9.0 9.1  Total Protein 6.5 - 8.1 g/dL - 7.6 -  Total Bilirubin 0.3 - 1.2 mg/dL - 0.7 -  Alkaline Phos 38 - 126 U/L - 72 -  AST 15 - 41 U/L - 26 -  ALT 0 - 44 U/L - 32 -       Micro Results Recent Results (from the past 240 hour(s))  SARS CORONAVIRUS 2 (TAT 6-24 HRS) Nasopharyngeal Nasopharyngeal Swab     Status: None   Collection Time: 04/23/20 11:56 AM   Specimen: Nasopharyngeal Swab  Result Value Ref Range Status   SARS Coronavirus 2 NEGATIVE NEGATIVE Final    Comment: (NOTE) SARS-CoV-2 target nucleic acids are NOT DETECTED.  The SARS-CoV-2 RNA is generally detectable in upper and lower respiratory specimens during the acute phase of infection. Negative results do not preclude SARS-CoV-2 infection, do not rule out co-infections with other pathogens, and should not be used as the sole basis for treatment or other patient management decisions. Negative results must be combined with clinical observations, patient history, and epidemiological information. The expected result is Negative.  Fact Sheet for Patients: SugarRoll.be  Fact Sheet for Healthcare Providers: https://www.woods-mathews.com/  This test  is not yet approved or cleared by the [place] FDA and  has been authorized for detection and/or diagnosis of SARS-CoV-2 by FDA under an Emergency Use Authorization (EUA). This EUA will remain  in effect (meaning this test can be used) for the duration of the COVID-19 declaration under Se ction 564(b)(1) of the Act, 21 U.S.C. section 360bbb-3(b)(1), unless the authorization is terminated or revoked sooner.  Performed at [facility], [place] [address] [place], [place] [address]   SARS Coronavirus 2 by RT PCR (hospital order, performed in [facility] hospital lab) Nasopharyngeal Nasopharyngeal Swab     Status: None   Collection Time: 05/01/20  1:20 PM   Specimen: Nasopharyngeal Swab  Result Value Ref Range Status   SARS Coronavirus 2 NEGATIVE NEGATIVE Final    Comment: (NOTE) SARS-CoV-2 target nucleic acids are NOT DETECTED.  The SARS-CoV-2 RNA is generally detectable in upper and lower respiratory specimens during the acute phase of infection. The lowest concentration of SARS-CoV-2 viral copies this assay can detect is 250 copies / mL. A negative result does not preclude SARS-CoV-2 infection and should not be used as the sole basis for treatment or other patient management decisions.  A negative result may occur with improper specimen collection / handling, submission of specimen other than nasopharyngeal swab, presence of viral mutation(s) within the areas targeted by this assay, and inadequate number of viral copies (<250 copies / mL). A negative result must be combined with clinical observations, patient history, and epidemiological information.  Fact Sheet for Patients:   StrictlyIdeas.no  Fact Sheet for Healthcare Providers: BankingDealers.co.za  This test is not yet approved or  cleared by the [place] FDA and has been authorized for detection and/or diagnosis of SARS-CoV-2 by FDA under an Emergency Use  Authorization (EUA).  This EUA will remain in effect (meaning this test can be used) for the duration of the COVID-19 declaration under Section 564(b)(1) of the Act, 21 U.S.C. section 360bbb-3(b)(1), unless the authorization is terminated or revoked sooner.  Performed at [facility], [address], [place], [place] [address]     Radiology Reports DG Chest 2 View  Result Date: 05/01/2020 CLINICAL DATA:  Shortness of breath worsening since yesterday. Lung biopsy 1 week ago. EXAM: CHEST - 2 VIEW COMPARISON:  04/24/2020 FINDINGS: Heart size is normal. Left chest is clear. Right pneumothorax, approximately 40%, without evidence of tension. No hemothorax. IMPRESSION: Right pneumothorax, approximately 40%, without evidence of tension. These results were called by telephone at the time of interpretation on 05/01/2020 at 10:19 am to provider PHILLIP STAFFORD , who verbally acknowledged these results. Electronically Signed   By: Nelson Chimes M.D.   On: 05/01/2020 10:19   CT ANGIO CHEST PE W OR WO CONTRAST  Result Date: 04/11/2020 CLINICAL DATA:  Chest pain.  History of lung cancer. EXAM: CT ANGIOGRAPHY CHEST WITH CONTRAST TECHNIQUE: Multidetector CT imaging of the chest was performed using the standard protocol during bolus administration of intravenous contrast. Multiplanar CT image reconstructions and MIPs were obtained to evaluate the vascular anatomy. CONTRAST:  20mL OMNIPAQUE IOHEXOL 350 MG/ML SOLN COMPARISON:  March 04, 2020. FINDINGS: Cardiovascular: Satisfactory opacification of the pulmonary arteries to the segmental level. No evidence of pulmonary embolism. Normal heart size. No pericardial effusion. Mediastinum/Nodes: No enlarged mediastinal, hilar, or axillary lymph nodes. Thyroid gland, trachea, and esophagus demonstrate no significant findings. Lungs/Pleura: No pneumothorax or pleural effusion is noted. Left lung is unremarkable. Stable nodular densities are noted in the right lower  lobe concerning for metastatic disease. Stable scarring or fibrosis is noted in the right middle lobe. Upper Abdomen: No acute abnormality. Musculoskeletal: Old right rib fracture is noted. Review of the MIP images confirms the above findings. IMPRESSION: 1. No definite evidence of pulmonary embolus. 2. Stable nodular densities are noted in the right lower lobe concerning for metastatic disease. 3. Stable scarring or fibrosis is noted in the right middle lobe. Electronically Signed   By: Marijo Conception M.D.   On: 04/11/2020 13:49   CT Biopsy  Result Date: 04/24/2020 INDICATION: History of lung cancer, now with enlarging right lower lobe pulmonary nodules. Please perform CT-guided biopsy for tissue diagnostic purposes. EXAM: CT-GUIDED RIGHT LOWER LOBE PULMONARY BIOPSY COMPARISON:  Chest CT-03/04/2020; 07/19/2019; PET-CT-03/20/2020 MEDICATIONS: None. ANESTHESIA/SEDATION: Fentanyl 25 mcg IV; Versed 0.5 mg IV Sedation time: 16 minutes; The patient was continuously monitored during the procedure by the interventional radiology nurse under my direct supervision. CONTRAST:  None COMPLICATIONS: SIR Level A - No therapy, no consequence. Procedure complicated by development of a small amount of self limited asymptomatic Peri biopsy hemorrhage. PROCEDURE: Informed consent was obtained from the patient following an explanation of the procedure, risks, benefits and alternatives. The patient understands,agrees and consents for the procedure. All questions were addressed. A time out was performed prior to the initiation of the procedure. The patient was positioned supine, slightly LPO on the CT table and a limited chest CT was performed for procedural planning demonstrating grossly unchanged size and appearance of right lower lobe pulmonary nodules with dominant right lower lobe nodule measuring approximately 1.2 x 1.0 cm (image 17, series 5). The operative site was prepped and draped in the usual sterile fashion. Under  sterile conditions and local anesthesia, a 17 gauge coaxial needle was advanced into the peripheral aspect of the nodule. Positioning was confirmed with intermittent CT fluoroscopy and followed by the acquisition of 2 core needle biopsies with an 18 gauge core needle biopsy device. Note, additional biopsies were unable to be acquired secondary to development of peribiopsy hemorrhage and obscuration of the nodule. The coaxial needle was removed following deployment of a [employer] plug and superficial hemostasis was achieved with manual compression. Limited post procedural chest CT demonstrated a non enlarging asymptomatic peri biopsy hemorrhage but without evidence of pneumothorax  or additional complication. A dressing was placed. The patient tolerated the procedure well without immediate postprocedural complication. The patient was escorted to have an upright chest radiograph. IMPRESSION: Technically successful CT guided core needle core biopsy of indeterminate right lower lobe pulmonary nodule. Procedure complicated by development of an asymptomatic peri biopsy hemorrhage however additional samples were unable to be acquired secondary to obscuration of the nodule due to the hemorrhage. Electronically Signed   By: Sandi Mariscal M.D.   On: 04/24/2020 15:05   DG Chest Portable 1 View  Result Date: 05/01/2020 CLINICAL DATA:  Chest tube for pneumothorax. EXAM: PORTABLE CHEST 1 VIEW COMPARISON:  05/01/2020 FINDINGS: Interval placement of right pigtail chest catheter in satisfactory position. Improvement in right pneumothorax which now is small in the apex. Minimal right pleural effusion. Right lower lobe atelectasis has improved aeration. Left lung remains clear. No heart failure. IMPRESSION: Satisfactory right chest tube with improvement in right pneumothorax. Residual small right apical pneumothorax. Improved aeration right lung base. Electronically Signed   By: Franchot Gallo M.D.   On: 05/01/2020 14:40   DG Chest  Port 1 View  Result Date: 04/24/2020 CLINICAL DATA:  Post right lower lobe pulmonary nodule biopsy EXAM: PORTABLE CHEST 1 VIEW COMPARISON:  02/11/2018; CT-guided right lower lobe pulmonary nodule biopsy-earlier same day FINDINGS: Grossly unchanged cardiac silhouette and mediastinal contours. The lungs remain hyperexpanded with mild diffuse slightly nodular thickening of the pulmonary interstitium. Improved aeration of the right lower lung. Known right lower lung nodular opacities are suboptimally evaluated on the present examination. Post treatment change involving the right mid lung. No pleural effusion or pneumothorax. No evidence of edema. No acute osseous abnormalities. IMPRESSION: No evidence of complication following right lower lobe pulmonary nodule biopsy. Specifically, no pneumothorax. Electronically Signed   By: Sandi Mariscal M.D.   On: 04/24/2020 14:29    SIGNED: Deatra James, MD, FACP, FHM. [facility],  Pager (please use [email or web address] to page/text)  If 7PM-7AM, please contact night-coverage [email or web address], Password [facility] 05/02/2020, 8:07 AM

## 2020-05-02 NOTE — Consult Note (Addendum)
[facility] SURGICAL CONSULTATION NOTE (initial) - cpt: [id number]   HISTORY OF PRESENT ILLNESS (HPI):  64 y.o. female presented to [facility] ED yesterday (09/15) for evaluation of shortness of breath. Patient reports that she underwent a CT guided lung biopsy on 09/08 secondary to enlarging right lower lobe pulmonary nodules. Pathology results showed adenocarcinoma of the lung, papillary and solid patterns. She does have a previous history of Stage 1 adenocarcinoma of the RUL and is s/p IMRT (03/10/2012) and s/p SRBT (04/05/2018) for RLL adenocarcinoma. She was initially doing well following the biopsy, however, in the last 2 days, she has reported increasing shortness of breath from her baseline. This is exacerbated with simple ambulation to even just her bathroom. Once resting, her SOB improves. At baseline, she does wear 2L supplemental O2 mostly at nighttime. She denied any fever, chills, cough, CP, abdominal pain, nausea, emesis. She denied any additional trauma or injury prior to the onset of her SOB. Laboratory work up in the ED was reassuring aside from stable chronic anemia. CXR was concerning for right sided pneumothorax. EDP placed chest tube and CXR showed interval improvement. She was admitted to the hospitalist service for management.  Surgery is consulted by hospitalist physician Dr. Francine Graven, MD in this context for evaluation and management of right pneumothorax and chest tube management.  PAST MEDICAL HISTORY (PMH):  Past Medical History:  Diagnosis Date   Arthritis    Asthma    Atherosclerosis of native arteries of extremity with intermittent claudication ([place]) 05/26/2016   Cancer ([place]) 2012   Right Lung CA   COPD (chronic obstructive pulmonary disease) ([place])    Depression    Diabetes mellitus without complication ([place])    Patient takes Janumet   Essential hypertension 05/26/2016   Hypercholesteremia    Hypertension    Oxygen dependent    2L at nite    PAD  (peripheral artery disease) ([place]) 06/22/2016   Peripheral vascular disease ([facility])    Personal history of radiation therapy    Shortness of breath dyspnea    with exertion    Sleep apnea      PAST SURGICAL HISTORY ([place]):  Past Surgical History:  Procedure Laterality Date   CESAREAN SECTION     x3   COLONOSCOPY WITH PROPOFOL N/A 06/25/2015   Procedure: COLONOSCOPY WITH PROPOFOL;  Surgeon: Lucilla Lame, MD;  Location: [facility] ENDOSCOPY;  Service: Endoscopy;  Laterality: N/A;   CYST REMOVAL LEG     and on shoulder    LOWER EXTREMITY ANGIOGRAPHY Left 09/29/2018   Procedure: LOWER EXTREMITY ANGIOGRAPHY;  Surgeon: Algernon Huxley, MD;  Location: [place] [facility];  Service: Cardiovascular;  Laterality: Left;   LUNG BIOPSY  05 15 2013   has lung "spots"   PERIPHERAL VASCULAR CATHETERIZATION Left 06/01/2016   Procedure: Lower Extremity Angiography;  Surgeon: Algernon Huxley, MD;  Location: [place] [facility];  Service: Cardiovascular;  Laterality: Left;   PERIPHERAL VASCULAR CATHETERIZATION N/A 06/01/2016   Procedure: Abdominal Aortogram w/Lower Extremity;  Surgeon: Algernon Huxley, MD;  Location: [place] [facility];  Service: Cardiovascular;  Laterality: N/A;   PERIPHERAL VASCULAR CATHETERIZATION  06/01/2016   Procedure: Lower Extremity Intervention;  Surgeon: Algernon Huxley, MD;  Location: [place] [facility];  Service: Cardiovascular;;   PERIPHERAL VASCULAR CATHETERIZATION Right 06/08/2016   Procedure: Lower Extremity Angiography;  Surgeon: Algernon Huxley, MD;  Location: [place] [facility];  Service: Cardiovascular;  Laterality: Right;   PERIPHERAL VASCULAR CATHETERIZATION  06/08/2016  Procedure: Lower Extremity Intervention;  Surgeon: Algernon Huxley, MD;  Location: [place] [facility];  Service: Cardiovascular;;     MEDICATIONS:  Prior to Admission medications   Medication Sig Start Date End Date Taking? Authorizing Provider  Accu-Chek FastClix Lancets MISC Use as directed twice a day diag E11.65  03/15/19  Yes Boscia, Heather E, NP  albuterol (PROVENTIL) (2.5 MG/3ML) 0.083% nebulizer solution Take 3 mLs (2.5 mg total) by nebulization every 6 (six) hours as needed for wheezing. 10/05/19  Yes Boscia, Heather E, NP  amLODipine (NORVASC) 10 MG tablet Take 1 tablet (10 mg total) by mouth every morning. 12/25/19  Yes Ronnell Freshwater, NP  aspirin EC 81 MG tablet Take 1 tablet (81 mg total) by mouth daily. 06/01/16  Yes Dew, Erskine Squibb, MD  buPROPion (WELLBUTRIN XL) 150 MG 24 hr tablet TAKE 1 TABLET BY MOUTH TWICE DAILY FOR SMOKING CESSATION Patient taking differently: Take 150 mg by mouth in the morning and at bedtime.  05/19/19  Yes Ronnell Freshwater, NP  clopidogrel (PLAVIX) 75 MG tablet Take 1 tablet by mouth once daily Patient taking differently: Take 75 mg by mouth daily.  03/25/20  Yes Kris Hartmann, NP  gabapentin (NEURONTIN) 100 MG capsule Take 1 capsule by mouth twice daily Patient taking differently: Take 100 mg by mouth 2 (two) times daily.  01/17/20  Yes Boscia, Heather E, NP  glucose blood (ACCU-CHEK AVIVA PLUS) test strip Use as instructed to check blood sugars three times daily.  E11.65 03/16/19  Yes Boscia, Heather E, NP  JANUVIA 25 MG tablet Take 1 tablet by mouth once daily Patient taking differently: Take 25 mg by mouth daily.  02/26/20  Yes Ronnell Freshwater, NP  lisinopril-hydrochlorothiazide (ZESTORETIC) 10-12.5 MG tablet Take 1 tablet by mouth every morning. 09/07/19  Yes Boscia, Heather E, NP  meloxicam (MOBIC) 7.5 MG tablet Take 1 tablet (7.5 mg total) by mouth 2 (two) times daily. 08/21/19  Yes Ronnell Freshwater, NP  metFORMIN (GLUCOPHAGE-XR) 500 MG 24 hr tablet Take 1 tablet (500 mg total) by mouth daily with breakfast. 01/29/20  Yes Boscia, Heather E, NP  mometasone-formoterol (DULERA) 200-5 MCG/ACT AERO Inhale 1 puff into the lungs 2 (two) times daily. Please fill as 90 day prescription 01/17/20  Yes Boscia, Heather E, NP  potassium chloride (KLOR-CON) 10 MEQ tablet TAKE 1 TABLET BY  MOUTH EVERY OTHER DAY FOR  LOW  POTASSIUM Patient taking differently: Take 10 mEq by mouth every other day.  04/10/20  Yes Luiz Ochoa, NP  rOPINIRole (REQUIP) 0.5 MG tablet Take 1 tablet (0.5 mg total) by mouth 2 (two) times daily. 03/21/20  Yes Boscia, Greer Ee, NP  SPIRIVA HANDIHALER 18 MCG inhalation capsule INHALE THE CONTENTS OF ONE CAPSULE ONCE DAILY. Patient taking differently: Place 18 mcg into inhaler and inhale daily.  02/18/20  Yes [name], MD  VENTOLIN HFA 108 (90 Base) MCG/ACT inhaler INHALE 2 PUFFS INTO LUNGS EVERY 4 HOURS AS NEEDED FOR WHEEZING OR SHORTNESS OF BREATH Patient taking differently: Inhale 2 puffs into the lungs every 4 (four) hours as needed for wheezing or shortness of breath.  10/31/19  Yes Boscia, Greer Ee, NP     ALLERGIES:  No Known Allergies   SOCIAL HISTORY:  Social History   Socioeconomic History   Marital status: Widowed    Spouse name: Not on file   Number of children: Not on file   Years of education: Not on file  Highest education level: Not on file  Occupational History   Not on file  Tobacco Use   Smoking status: Former Smoker    Packs/day: 1.00    Years: 37.00    Pack years: 37.00    Types: Cigarettes    Quit date: 02/06/2010    Years since quitting: 10.2   Smokeless tobacco: Former Systems developer    Types: Snuff  Vaping Use   Vaping Use: Never used  Substance and Sexual Activity   Alcohol use: Yes    Alcohol/week: 5.0 standard drinks    Types: 5 Cans of beer per week    Comment: /h x of alcohol abuse -stopped 2012- now drinks 5 beer per w   Drug use: Not Currently    Types: Marijuana, "Crack" cocaine, Cocaine    Comment: hx of cocaine use- last use 2015; last use sat marijuana6/22/19,    Sexual activity: Yes  Other Topics Concern   Not on file  Social History Narrative   Lives with Significant Other x 43 years   Social Determinants of Health   Financial Resource Strain:    Difficulty of Paying Living Expenses: Not on file    Food Insecurity:    Worried About Charity fundraiser in the Last Year: Not on file   [employer] of Food in the Last Year: Not on file  Transportation Needs:    Lack of Transportation (Medical): Not on file   Lack of Transportation (Non-Medical): Not on file  Physical Activity:    Days of Exercise per Week: Not on file   Minutes of Exercise per Session: Not on file  Stress:    Feeling of Stress : Not on file  Social Connections:    Frequency of Communication with Friends and Family: Not on file   Frequency of Social Gatherings with Friends and Family: Not on file   Attends Religious Services: Not on file   Active Member of Clubs or Organizations: Not on file   Attends Archivist Meetings: Not on file   Marital Status: Not on file  Intimate Partner Violence:    Fear of Current or Ex-Partner: Not on file   Emotionally Abused: Not on file   Physically Abused: Not on file   Sexually Abused: Not on file     FAMILY HISTORY:  Family History  Problem Relation Age of Onset   Lung cancer Father    Diabetes Mother    Hypercholesterolemia Mother    Diabetes Sister    Diabetes Maternal Grandmother    Diabetes Paternal Grandmother    Diabetes Sister    Heart attack Brother    Coronary artery disease Brother    Vascular Disease Brother    Hypertension Sister    Heart attack Brother       REVIEW OF SYSTEMS:  Review of Systems  Constitutional: Negative for chills.  HENT: Negative for congestion and sore throat.   Respiratory: Positive for shortness of breath. Negative for cough.   Cardiovascular: Negative for chest pain and palpitations.  Gastrointestinal: Negative for abdominal pain, diarrhea, nausea and vomiting.  Genitourinary: Negative for dysuria and urgency.  All other systems reviewed and are negative.   VITAL SIGNS:  Temp:  [98 F (36.7 C)] 98 F (36.7 C) (09/15 0938) Pulse Rate:  [90-111] 90 (09/16 0800) Resp:  [16-22] 20 (09/16 0800) BP:  (112-143)/(78-105) 129/84 (09/16 0800) SpO2:  [96 %-100 %] 99 % (09/16 0800) Weight:  [49.9 kg] 49.9 kg (09/15 0938)  Height: 4\' 11"  (149.9 cm) Weight: 49.9 kg BMI (Calculated): 22.21   INTAKE/OUTPUT:  No intake/output data recorded.  PHYSICAL EXAM:  Physical Exam Vitals and nursing note reviewed.  Constitutional:      General: She is not in acute distress.    Appearance: She is well-developed and normal weight. She is not ill-appearing.  HENT:     Head: Normocephalic and atraumatic.  Cardiovascular:     Rate and Rhythm: Normal rate and regular rhythm.     Pulses: Normal pulses.     Heart sounds: No murmur heard.  No friction rub. No gallop.   Pulmonary:     Effort: Pulmonary effort is normal.     Breath sounds: Normal breath sounds. No decreased breath sounds, rhonchi or rales.     Comments: On [place] Chest:       Comments: Chest tube in place, no drainage in pleurovac, no evidence of air leak  Musculoskeletal:     Right lower leg: No tenderness.     Left lower leg: No tenderness.  Skin:    General: Skin is warm and dry.     Coloration: Skin is not pale.     Findings: No erythema.  Neurological:     General: No focal deficit present.     Mental Status: She is alert and oriented to person, place, and time.  Psychiatric:        Mood and Affect: Mood normal.        Behavior: Behavior normal.      Labs:  CBC Latest Ref Rng & Units 05/02/2020 05/01/2020 04/29/2020  WBC 4.0 - 10.5 K/uL 8.2 10.0 10.6(H)  Hemoglobin 12.0 - 15.0 g/dL 10.1(L) 10.5(L) 10.9(L)  Hematocrit 36 - 46 % 34.5(L) 35.7(L) 36.2  Platelets 150 - 400 K/uL 314 342 445(H)   CMP Latest Ref Rng & Units 05/02/2020 05/01/2020 04/11/2020  Glucose 70 - 99 mg/dL 123(H) 148(H) 105(H)  BUN 8 - 23 mg/dL 15 11 14   Creatinine 0.44 - 1.00 mg/dL 0.59 0.50 0.64  Sodium 135 - 145 mmol/L 134(L) 137 136  Potassium 3.5 - 5.1 mmol/L 4.1 4.0 3.5  Chloride 98 - 111 mmol/L 98 100 100  CO2 22 - 32 mmol/L 29 28 27   Calcium 8.9 -  10.3 mg/dL 9.0 9.0 9.1  Total Protein 6.5 - 8.1 g/dL - 7.6 -  Total Bilirubin 0.3 - 1.2 mg/dL - 0.7 -  Alkaline Phos 38 - 126 U/L - 72 -  AST 15 - 41 U/L - 26 -  ALT 0 - 44 U/L - 32 -     Imaging studies:   CXR (05/01/2020 - 1019) personally reviewed showing right side pneumothorax, no evidence of tension component, and radiologist report reviewed:  IMPRESSION: Right pneumothorax, approximately 40%, without evidence of tension  CXR (05/01/2020 - 1440) personally reviewed showing interval placement of right pigtail chest tube, resolution in pneumothorax, small right sided apical residual PTX remains, and radiologist report reviewed:  IMPRESSION: Satisfactory right chest tube with improvement in right pneumothorax. Residual small right apical pneumothorax. Improved aeration right lung base.   Assessment/Plan: (ICD-10's: J93.3) 64 y.o. female with right sided pneumothorax s/p chest tube placement by EDP on 50/93, complicated by pertinent comorbidities including history or RUL/RLL adenocarcinoma s/p CT-guided biopsy on 09/08.   - Continue chest tube to suction this morning; -20 cm suction; monitor and record output  - Recommend daily CXR; I will order these  - Continue home O2 support as needed   -  Pulmonary toilet; IS use  - Okay to disconnect chest tube to mobilize; reconnect to suction once in room  - Further management per primary service    All of the above findings and recommendations were discussed with the patient and her family, and all of their questions were answered to their expressed satisfaction.  Thank you for the opportunity to participate in this patient's care.   I have persoanlly seen and examined this patient and I agree with this note as written.  [place], PA-C [facility] 05/02/2020, 8:42 AM [phone] M-F: 7am - 4pm

## 2020-05-02 NOTE — ED Notes (Addendum)
Chest tube to low wall suction. Small amount of serosanguinous drainage in tubing, no drainage in chest tube container. Pt alert, using incentive spirometer. Pt denies SOB. Bg 132

## 2020-05-02 NOTE — ED Notes (Addendum)
Attempted report, floor unavailable to take patient d/t staffing, ED charge RN aware

## 2020-05-03 ENCOUNTER — Encounter: Payer: Self-pay | Admitting: Internal Medicine

## 2020-05-03 ENCOUNTER — Observation Stay: Payer: BLUE CROSS/BLUE SHIELD

## 2020-05-03 DIAGNOSIS — J948 Other specified pleural conditions: Secondary | ICD-10-CM | POA: Diagnosis present

## 2020-05-03 DIAGNOSIS — R0602 Shortness of breath: Secondary | ICD-10-CM | POA: Diagnosis present

## 2020-05-03 DIAGNOSIS — G4730 Sleep apnea, unspecified: Secondary | ICD-10-CM | POA: Diagnosis present

## 2020-05-03 DIAGNOSIS — Z791 Long term (current) use of non-steroidal anti-inflammatories (NSAID): Secondary | ICD-10-CM | POA: Diagnosis not present

## 2020-05-03 DIAGNOSIS — J939 Pneumothorax, unspecified: Secondary | ICD-10-CM | POA: Diagnosis not present

## 2020-05-03 DIAGNOSIS — Z923 Personal history of irradiation: Secondary | ICD-10-CM | POA: Diagnosis not present

## 2020-05-03 DIAGNOSIS — Z801 Family history of malignant neoplasm of trachea, bronchus and lung: Secondary | ICD-10-CM | POA: Diagnosis not present

## 2020-05-03 DIAGNOSIS — Z79899 Other long term (current) drug therapy: Secondary | ICD-10-CM | POA: Diagnosis not present

## 2020-05-03 DIAGNOSIS — E1151 Type 2 diabetes mellitus with diabetic peripheral angiopathy without gangrene: Secondary | ICD-10-CM | POA: Diagnosis present

## 2020-05-03 DIAGNOSIS — R000 Tachycardia, unspecified: Secondary | ICD-10-CM | POA: Diagnosis present

## 2020-05-03 DIAGNOSIS — Z87891 Personal history of nicotine dependence: Secondary | ICD-10-CM | POA: Diagnosis not present

## 2020-05-03 DIAGNOSIS — C3431 Malignant neoplasm of lower lobe, right bronchus or lung: Secondary | ICD-10-CM | POA: Diagnosis present

## 2020-05-03 DIAGNOSIS — Y848 Other medical procedures as the cause of abnormal reaction of the patient, or of later complication, without mention of misadventure at the time of the procedure: Secondary | ICD-10-CM | POA: Diagnosis present

## 2020-05-03 DIAGNOSIS — Z23 Encounter for immunization: Secondary | ICD-10-CM | POA: Diagnosis not present

## 2020-05-03 DIAGNOSIS — F329 Major depressive disorder, single episode, unspecified: Secondary | ICD-10-CM | POA: Diagnosis present

## 2020-05-03 DIAGNOSIS — E7800 Pure hypercholesterolemia, unspecified: Secondary | ICD-10-CM | POA: Diagnosis present

## 2020-05-03 DIAGNOSIS — J95811 Postprocedural pneumothorax: Secondary | ICD-10-CM | POA: Diagnosis present

## 2020-05-03 DIAGNOSIS — Z8249 Family history of ischemic heart disease and other diseases of the circulatory system: Secondary | ICD-10-CM | POA: Diagnosis not present

## 2020-05-03 DIAGNOSIS — Z83438 Family history of other disorder of lipoprotein metabolism and other lipidemia: Secondary | ICD-10-CM | POA: Diagnosis not present

## 2020-05-03 DIAGNOSIS — J449 Chronic obstructive pulmonary disease, unspecified: Secondary | ICD-10-CM | POA: Diagnosis present

## 2020-05-03 DIAGNOSIS — Z7982 Long term (current) use of aspirin: Secondary | ICD-10-CM | POA: Diagnosis not present

## 2020-05-03 DIAGNOSIS — I959 Hypotension, unspecified: Secondary | ICD-10-CM | POA: Diagnosis not present

## 2020-05-03 DIAGNOSIS — I10 Essential (primary) hypertension: Secondary | ICD-10-CM | POA: Diagnosis present

## 2020-05-03 DIAGNOSIS — Z20822 Contact with and (suspected) exposure to covid-19: Secondary | ICD-10-CM | POA: Diagnosis present

## 2020-05-03 DIAGNOSIS — J9610 Chronic respiratory failure, unspecified whether with hypoxia or hypercapnia: Secondary | ICD-10-CM | POA: Diagnosis present

## 2020-05-03 DIAGNOSIS — Z833 Family history of diabetes mellitus: Secondary | ICD-10-CM | POA: Diagnosis not present

## 2020-05-03 DIAGNOSIS — Z9981 Dependence on supplemental oxygen: Secondary | ICD-10-CM | POA: Diagnosis not present

## 2020-05-03 DIAGNOSIS — Z7984 Long term (current) use of oral hypoglycemic drugs: Secondary | ICD-10-CM | POA: Diagnosis not present

## 2020-05-03 HISTORY — DX: Other specified pleural conditions: J94.8

## 2020-05-03 LAB — GLUCOSE, CAPILLARY
Glucose-Capillary: 110 mg/dL — ABNORMAL HIGH (ref 70–99)
Glucose-Capillary: 200 mg/dL — ABNORMAL HIGH (ref 70–99)
Glucose-Capillary: 123 mg/dL — ABNORMAL HIGH (ref 70–99)
Glucose-Capillary: 103 mg/dL — ABNORMAL HIGH (ref 70–99)

## 2020-05-03 MED ORDER — IBUPROFEN 400 MG PO TABS
600.0000 mg | ORAL_TABLET | Freq: Four times a day (QID) | ORAL | Status: DC | PRN
  Administered 2020-05-03 (×2): 600 mg via ORAL
  Filled 2020-05-03 (×2): qty 2

## 2020-05-03 MED ORDER — INFLUENZA VAC SPLIT QUAD 0.5 ML IM SUSY
0.5000 mL | PREFILLED_SYRINGE | INTRAMUSCULAR | Status: AC
  Administered 2020-05-05: 0.5 mL via INTRAMUSCULAR
  Filled 2020-05-03: qty 0.5

## 2020-05-03 MED ORDER — ACETAMINOPHEN 325 MG PO TABS: 650.0000 mg | ORAL_TABLET | Freq: Four times a day (QID) | ORAL | Status: DC | PRN

## 2020-05-03 NOTE — Progress Notes (Signed)
Pt received to 2A room 249 from ED.  Pt oriented to room and call bell.   NSR on the monitor.  See assessment and vs's and admit data base.  Pt c/o pain 9/10 rt flank.  Site wnl.  CT to 20cm suction.  See MAR for meds given.

## 2020-05-03 NOTE — Progress Notes (Signed)
Angela Jensen Follow Up Note  [name], female   DOB: Nov 09, 1955, 64 y.o.   MRN: [id number]  HISTORY: No complaints this morning.  Not short of breath.    Vitals:   05/03/20 0359 05/03/20 0724  BP: (!) 113/59 91/61  Pulse: 89 83  Resp: 17 16  Temp: 98.1 F (36.7 C) 97.9 F (36.6 C)  SpO2: 98% 98%     EXAM:  Resp: Lungs are clear bilaterally.  No respiratory distress, normal effort. Heart:  Regular without murmurs Abd:  Abdomen is soft, non distended and non tender. No masses are palpable.  There is no rebound and no guarding.  Neurological: Alert and oriented to person, place, and time. Coordination normal.  Skin: Skin is warm and dry. No rash noted. No diaphoretic. No erythema. No pallor.  Psychiatric: Normal mood and affect. Normal behavior. Judgment and thought content normal.   No air leak.  No drainage.  Independent review of CXRay shows no pneumothorax   ASSESSMENT: Will place chest tube to water seal and repeat chest xray this afternoon   PLAN:   If no pneumothorax on today's xray, leave on water seal tonight and repeat CXRay in the morning.  If OK, then pull the chest tube and discharge tomorrow.    Nestor Lewandowsky, MDPatient ID: Angela Jensen, female   DOB: [birth year], 64 y.o.   MRN: [id number]

## 2020-05-03 NOTE — Progress Notes (Signed)
PROGRESS NOTE    Patient: Angela Jensen                            PCP: Ronnell Freshwater, NP                    DOB: 1956/08/03            DOA: 05/01/2020 KZS:[id number]             DOS: 05/03/2020, 11:26 AM   LOS: 0 days   Date of Service: The patient was seen and examined on 05/03/2020  Subjective:  The patient was seen and examined this morning, remained stable, hypotensive, complaining of right flank pain.   Right chest wall chest tube still in place .Marland Kitchen Expressing discomfort In no respiratory distress, denies of any increase in respiratory effort   Brief Narrative:   Per HPI:  Angela Jensen is a 64 y.o. 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 Patient reports a CT-guided lung biopsy of her right lower lobe on 04/24/20--   pathology is consistent with adenocarcinoma.  She reports doing well after this biopsy until the past 2 days when she  noticed significantly worsened shortness of breath from baseline. She reports difficulty ambulating to her bathroom due to the severity of her shortness of breath. She reports she "eventually" gets back to baseline after being seated. She denies any pain anywhere, particularly her chest or her abdomen or flank. She denies increased cough or sputum production, her only complaint is dyspnea.    ED Course:  Patient found to have a right pneumothorax and had a pigtail inserted in the ER.  She will be referred to observation status for further evaluation. Labs show sodium 137, potasium 4.0, chloride 100, BUN 11, creatinine 0.50, AST 26, ALT 32, WBC 10, Hemoglobin 10.5, HCT 35.7, Platelets 342, CXR reviewed by me shows right pneumothorax, approximately 40%, without evidence of tension. 12 Lead EKG reviewed by me shows sinus tachycardia   Assessment & Plan:   Principal Problem:    Pneumothorax on right Active Problems:   Essential hypertension   Diabetes (HCC)   PAD (peripheral artery disease) (HCC)   Cancer of lower lobe of right lung (HCC)   Chronic obstructive pulmonary disease (HCC)   Pneumothorax after biopsy  Pneumothorax of rght lung Right pneumothorax, approximately 40%, without evidence of tension -Right-sided chest tube in place -placed on night of 05/02/2020  Following a procedure, patient is s/p CT guided lung biopsy done on 04/24/20 -Stable following with chest x-rays closely CT surgery consulted, following   -CT surgery will follow with a chest x-ray today,  improved pneumothorax - planning to leave the chest tube to waterseal, with possible discontinue the chest tube in a.m.  -Chest x-ray this morning revealing chest tube in the right chest wall, resolved pneumothorax  -Continue O2 supplements maintaining O2 sat greater 92%  Diabetes Mellitus -Continue diabetic diet -Checking CBG QA CHS, SSI coverage    HTN -Hypotensive this a.m., we will holding home BP meds Holding- Lisisnopril/HCTZ and amlodipine -We will initiate bolus normal saline to 50, continue with normal saline maintenance  PAD Stable Continue statin, Plavix and Aspirin  COPD No signs of exacerbation Continue PRN bronchodilator therapy, spiriva and inhaled steroids  Depression Stable no acute distress Continue Bupropion   Lung CA Patient is s/p CT guided biopsy of right lower lung nodule and surgical pathology confirms - ADENOCARCINOMA OF THE LUNG, PAPILLARY AND SOLID PATTERNS.  Follow up with oncology as an out patient -Remained stable will follow up with primary oncologist as an outpatient   DVT prophylaxis: SCD Code Status: Full code Family Communication:   No family member present at bedsides  The above findings and plan of care has been discussed with patient (and family )  in detail,  they expressed understanding and agreement of  above. -Advance care planning has been discussed.     Disposition Plan: Back to previous home environment Consults called: CT susrgery  Disposition; Patient presenting from: Home Anticipated discharge: Back to previous setting Home in 1 to 2 days  Procedures:   No admission procedures for hospital encounter.     Antimicrobials:  Anti-infectives (From admission, onward)   None       Medication:  . aspirin EC  81 mg Oral Daily  . buPROPion  150 mg Oral Daily  . clopidogrel  75 mg Oral Daily  . gabapentin  100 mg Oral BID  . [START ON 05/04/2020] influenza vac split quadrivalent PF  0.5 mL Intramuscular Tomorrow-1000  . insulin aspart  0-15 Units Subcutaneous TID WC  . mometasone-formoterol  1 puff Inhalation BID  . potassium chloride  10 mEq Oral QODAY  . rOPINIRole  0.5 mg Oral BID  . rosuvastatin  10 mg Oral QHS  . sodium chloride flush  3 mL Intravenous Q12H  . tiotropium  18 mcg Inhalation Daily    sodium chloride, acetaminophen, albuterol, ibuprofen, morphine injection, ondansetron **OR** ondansetron (ZOFRAN) IV, sodium chloride flush   Objective:   Vitals:   05/03/20 0359 05/03/20 0724 05/03/20 0937 05/03/20 1055  BP: (!) 113/59 91/61 97/69  (!) 86/65  Pulse: 89 83    Resp: 17 16    Temp: 98.1 F (36.7 C) 97.9 F (36.6 C)    TempSrc: Oral Oral    SpO2: 98% 98%    Weight:      Height:        Intake/Output Summary (Last 24 hours) at 05/03/2020 1126 Last data filed at 05/03/2020 1000 Gross per 24 hour  Intake 120 ml  Output 800 ml  Net -680 ml   Filed Weights   05/01/20 0938  Weight: 49.9 kg     Examination:       Physical Exam:   General:  Alert, oriented, cooperative, no distress;   HEENT:  Normocephalic, PERRL, otherwise with in Normal limits   Neuro:  CNII-XII intact. , normal motor and sensation, reflexes intact   Lungs:   Clear to auscultation BL, Respirations unlabored, no wheezes / crackles  Cardio:    S1/S2, RRR, No murmure, No  Rubs or Gallops   Abdomen:   Soft, non-tender, bowel sounds active all four quadrants,  no guarding or peritoneal signs.  Muscular skeletal:  Limited exam - in bed, able to move all 4 extremities, Normal strength,  2+ pulses,  symmetric, No pitting edema  Skin:  Dry, warm to touch, negative for any Rashes, No open wounds  Wounds: Please see nursing documentation         ------------------------------------------------------------------------------------------------------------------------------------------    LABs:  CBC Latest Ref Rng & Units 05/02/2020 05/01/2020 04/29/2020  WBC 4.0 - 10.5 K/uL 8.2 10.0 10.6(H)  Hemoglobin 12.0 - 15.0 g/dL 10.1(L) 10.5(L) 10.9(L)  Hematocrit 36 - 46 %  34.5(L) 35.7(L) 36.2  Platelets 150 - 400 K/uL 314 342 445(H)   CMP Latest Ref Rng & Units 05/02/2020 05/01/2020 04/11/2020  Glucose 70 - 99 mg/dL 123(H) 148(H) 105(H)  BUN 8 - 23 mg/dL 15 11 14   Creatinine 0.44 - 1.00 mg/dL 0.59 0.50 0.64  Sodium 135 - 145 mmol/L 134(L) 137 136  Potassium 3.5 - 5.1 mmol/L 4.1 4.0 3.5  Chloride 98 - 111 mmol/L 98 100 100  CO2 22 - 32 mmol/L 29 28 27   Calcium 8.9 - 10.3 mg/dL 9.0 9.0 9.1  Total Protein 6.5 - 8.1 g/dL - 7.6 -  Total Bilirubin 0.3 - 1.2 mg/dL - 0.7 -  Alkaline Phos 38 - 126 U/L - 72 -  AST 15 - 41 U/L - 26 -  ALT 0 - 44 U/L - 32 -       Micro Results Recent Results (from the past 240 hour(s))  SARS CORONAVIRUS 2 (TAT 6-24 HRS) Nasopharyngeal Nasopharyngeal Swab     Status: None   Collection Time: 04/23/20 11:56 AM   Specimen: Nasopharyngeal Swab  Result Value Ref Range Status   SARS Coronavirus 2 NEGATIVE NEGATIVE Final    Comment: (NOTE) SARS-CoV-2 target nucleic acids are NOT DETECTED.  The SARS-CoV-2 RNA is generally detectable in upper and lower respiratory specimens during the acute phase of infection. Negative results do not preclude SARS-CoV-2 infection, do not rule out co-infections with other pathogens, and should not be used as  the sole basis for treatment or other patient management decisions. Negative results must be combined with clinical observations, patient history, and epidemiological information. The expected result is Negative.  Fact Sheet for Patients: SugarRoll.be  Fact Sheet for Healthcare Providers: https://www.woods-mathews.com/  This test is not yet approved or cleared by the [place] FDA and  has been authorized for detection and/or diagnosis of SARS-CoV-2 by FDA under an Emergency Use Authorization (EUA). This EUA will remain  in effect (meaning this test can be used) for the duration of the COVID-19 declaration under Se ction 564(b)(1) of the Act, 21 U.S.C. section 360bbb-3(b)(1), unless the authorization is terminated or revoked sooner.  Performed at [facility], [place] [address] [place], [place] [address]   SARS Coronavirus 2 by RT PCR (hospital order, performed in [facility] hospital lab) Nasopharyngeal Nasopharyngeal Swab     Status: None   Collection Time: 05/01/20  1:20 PM   Specimen: Nasopharyngeal Swab  Result Value Ref Range Status   SARS Coronavirus 2 NEGATIVE NEGATIVE Final    Comment: (NOTE) SARS-CoV-2 target nucleic acids are NOT DETECTED.  The SARS-CoV-2 RNA is generally detectable in upper and lower respiratory specimens during the acute phase of infection. The lowest concentration of SARS-CoV-2 viral copies this assay can detect is 250 copies / mL. A negative result does not preclude SARS-CoV-2 infection and should not be used as the sole basis for treatment or other patient management decisions.  A negative result may occur with improper specimen collection / handling, submission of specimen other than nasopharyngeal swab, presence of viral mutation(s) within the areas targeted by this assay, and inadequate number of viral copies (<250 copies / mL). A negative result must be combined with clinical observations,  patient history, and epidemiological information.  Fact Sheet for Patients:   StrictlyIdeas.no  Fact Sheet for Healthcare Providers: BankingDealers.co.za  This test is not yet approved or  cleared by the [place] FDA and has been authorized for detection and/or diagnosis of SARS-CoV-2 by FDA  under an Emergency Use Authorization (EUA).  This EUA will remain in effect (meaning this test can be used) for the duration of the COVID-19 declaration under Section 564(b)(1) of the Act, 21 U.S.C. section 360bbb-3(b)(1), unless the authorization is terminated or revoked sooner.  Performed at [facility], [address], [place], [place] [address]     Radiology Reports DG Chest 1 View  Result Date: 05/03/2020 CLINICAL DATA:  Right pneumothorax EXAM: CHEST  1 VIEW COMPARISON:  05/02/2020 FINDINGS: Right chest tube remains in place. No visible residual right pneumothorax. Areas of atelectasis or scarring in the right lung. Left lung clear. Heart is normal size. IMPRESSION: Right chest tube remains in place. No visible residual pneumothorax. Electronically Signed   By: Rolm Baptise M.D.   On: 05/03/2020 05:17   DG Chest 1 View  Result Date: 05/02/2020 CLINICAL DATA:  Pneumothorax EXAM: CHEST  1 VIEW COMPARISON:  May 01, 2020 FINDINGS: Pigtail catheter present on the right with persistent small right apical pneumothorax. No tension component. Scattered areas of atelectatic change noted on the right. Lungs elsewhere are clear. Heart size and pulmonary vascularity are normal. No adenopathy. No bone lesions. IMPRESSION: Chest tube unchanged in position on the right with small right apical pneumothorax, stable. No tension component. Scattered areas of atelectatic change on the right. Lungs elsewhere clear. Stable cardiac silhouette. Electronically Signed   By: Lowella Grip III M.D.   On: 05/02/2020 09:28   DG Chest 2 View  Result  Date: 05/01/2020 CLINICAL DATA:  Shortness of breath worsening since yesterday. Lung biopsy 1 week ago. EXAM: CHEST - 2 VIEW COMPARISON:  04/24/2020 FINDINGS: Heart size is normal. Left chest is clear. Right pneumothorax, approximately 40%, without evidence of tension. No hemothorax. IMPRESSION: Right pneumothorax, approximately 40%, without evidence of tension. These results were called by telephone at the time of interpretation on 05/01/2020 at 10:19 am to provider PHILLIP STAFFORD , who verbally acknowledged these results. Electronically Signed   By: Nelson Chimes M.D.   On: 05/01/2020 10:19   CT ANGIO CHEST PE W OR WO CONTRAST  Result Date: 04/11/2020 CLINICAL DATA:  Chest pain.  History of lung cancer. EXAM: CT ANGIOGRAPHY CHEST WITH CONTRAST TECHNIQUE: Multidetector CT imaging of the chest was performed using the standard protocol during bolus administration of intravenous contrast. Multiplanar CT image reconstructions and MIPs were obtained to evaluate the vascular anatomy. CONTRAST:  76mL OMNIPAQUE IOHEXOL 350 MG/ML SOLN COMPARISON:  March 04, 2020. FINDINGS: Cardiovascular: Satisfactory opacification of the pulmonary arteries to the segmental level. No evidence of pulmonary embolism. Normal heart size. No pericardial effusion. Mediastinum/Nodes: No enlarged mediastinal, hilar, or axillary lymph nodes. Thyroid gland, trachea, and esophagus demonstrate no significant findings. Lungs/Pleura: No pneumothorax or pleural effusion is noted. Left lung is unremarkable. Stable nodular densities are noted in the right lower lobe concerning for metastatic disease. Stable scarring or fibrosis is noted in the right middle lobe. Upper Abdomen: No acute abnormality. Musculoskeletal: Old right rib fracture is noted. Review of the MIP images confirms the above findings. IMPRESSION: 1. No definite evidence of pulmonary embolus. 2. Stable nodular densities are noted in the right lower lobe concerning for metastatic disease.  3. Stable scarring or fibrosis is noted in the right middle lobe. Electronically Signed   By: Marijo Conception M.D.   On: 04/11/2020 13:49   CT Biopsy  Result Date: 04/24/2020 INDICATION: History of lung cancer, now with enlarging right lower lobe pulmonary nodules. Please perform CT-guided biopsy  for tissue diagnostic purposes. EXAM: CT-GUIDED RIGHT LOWER LOBE PULMONARY BIOPSY COMPARISON:  Chest CT-03/04/2020; 07/19/2019; PET-CT-03/20/2020 MEDICATIONS: None. ANESTHESIA/SEDATION: Fentanyl 25 mcg IV; Versed 0.5 mg IV Sedation time: 16 minutes; The patient was continuously monitored during the procedure by the interventional radiology nurse under my direct supervision. CONTRAST:  None COMPLICATIONS: SIR Level A - No therapy, no consequence. Procedure complicated by development of a small amount of self limited asymptomatic Peri biopsy hemorrhage. PROCEDURE: Informed consent was obtained from the patient following an explanation of the procedure, risks, benefits and alternatives. The patient understands,agrees and consents for the procedure. All questions were addressed. A time out was performed prior to the initiation of the procedure. The patient was positioned supine, slightly LPO on the CT table and a limited chest CT was performed for procedural planning demonstrating grossly unchanged size and appearance of right lower lobe pulmonary nodules with dominant right lower lobe nodule measuring approximately 1.2 x 1.0 cm (image 17, series 5). The operative site was prepped and draped in the usual sterile fashion. Under sterile conditions and local anesthesia, a 17 gauge coaxial needle was advanced into the peripheral aspect of the nodule. Positioning was confirmed with intermittent CT fluoroscopy and followed by the acquisition of 2 core needle biopsies with an 18 gauge core needle biopsy device. Note, additional biopsies were unable to be acquired secondary to development of peribiopsy hemorrhage and obscuration of  the nodule. The coaxial needle was removed following deployment of a [employer] plug and superficial hemostasis was achieved with manual compression. Limited post procedural chest CT demonstrated a non enlarging asymptomatic peri biopsy hemorrhage but without evidence of pneumothorax or additional complication. A dressing was placed. The patient tolerated the procedure well without immediate postprocedural complication. The patient was escorted to have an upright chest radiograph. IMPRESSION: Technically successful CT guided core needle core biopsy of indeterminate right lower lobe pulmonary nodule. Procedure complicated by development of an asymptomatic peri biopsy hemorrhage however additional samples were unable to be acquired secondary to obscuration of the nodule due to the hemorrhage. Electronically Signed   By: Sandi Mariscal M.D.   On: 04/24/2020 15:05   DG Chest Portable 1 View  Result Date: 05/01/2020 CLINICAL DATA:  Chest tube for pneumothorax. EXAM: PORTABLE CHEST 1 VIEW COMPARISON:  05/01/2020 FINDINGS: Interval placement of right pigtail chest catheter in satisfactory position. Improvement in right pneumothorax which now is small in the apex. Minimal right pleural effusion. Right lower lobe atelectasis has improved aeration. Left lung remains clear. No heart failure. IMPRESSION: Satisfactory right chest tube with improvement in right pneumothorax. Residual small right apical pneumothorax. Improved aeration right lung base. Electronically Signed   By: Franchot Gallo M.D.   On: 05/01/2020 14:40   DG Chest Port 1 View  Result Date: 04/24/2020 CLINICAL DATA:  Post right lower lobe pulmonary nodule biopsy EXAM: PORTABLE CHEST 1 VIEW COMPARISON:  02/11/2018; CT-guided right lower lobe pulmonary nodule biopsy-earlier same day FINDINGS: Grossly unchanged cardiac silhouette and mediastinal contours. The lungs remain hyperexpanded with mild diffuse slightly nodular thickening of the pulmonary interstitium.  Improved aeration of the right lower lung. Known right lower lung nodular opacities are suboptimally evaluated on the present examination. Post treatment change involving the right mid lung. No pleural effusion or pneumothorax. No evidence of edema. No acute osseous abnormalities. IMPRESSION: No evidence of complication following right lower lobe pulmonary nodule biopsy. Specifically, no pneumothorax. Electronically Signed   By: Sandi Mariscal M.D.   On: 04/24/2020 14:29  SIGNED: Deatra James, MD, FACP, FHM. [facility],  Pager (please use [email or web address] to page/text)  If 7PM-7AM, please contact night-coverage [email or web address] 05/03/2020, 11:26 AM

## 2020-05-03 NOTE — Plan of Care (Signed)

## 2020-05-04 ENCOUNTER — Inpatient Hospital Stay: Payer: BLUE CROSS/BLUE SHIELD

## 2020-05-04 LAB — BASIC METABOLIC PANEL
Anion gap: 5 (ref 5–15)
BUN: 11 mg/dL (ref 8–23)
CO2: 28 mmol/L (ref 22–32)
Calcium: 8.3 mg/dL — ABNORMAL LOW (ref 8.9–10.3)
Chloride: 107 mmol/L (ref 98–111)
Creatinine, Ser: 0.53 mg/dL (ref 0.44–1.00)
GFR calc Af Amer: >60 mL/min (ref >60)
GFR calc non Af Amer: >60 mL/min (ref >60)
Glucose, Bld: 100 mg/dL — ABNORMAL HIGH (ref 70–99)
Potassium: 3.7 mmol/L (ref 3.5–5.1)
Sodium: 140 mmol/L (ref 135–145)

## 2020-05-04 LAB — GLUCOSE, CAPILLARY
Glucose-Capillary: 102 mg/dL — ABNORMAL HIGH (ref 70–99)
Glucose-Capillary: 129 mg/dL — ABNORMAL HIGH (ref 70–99)
Glucose-Capillary: 112 mg/dL — ABNORMAL HIGH (ref 70–99)
Glucose-Capillary: 106 mg/dL — ABNORMAL HIGH (ref 70–99)

## 2020-05-04 LAB — CBC
HCT: 29.1 % — ABNORMAL LOW (ref 36.0–46.0)
Hemoglobin: 8.5 g/dL — ABNORMAL LOW (ref 12.0–15.0)
MCH: 23.1 pg — ABNORMAL LOW (ref 26.0–34.0)
MCHC: 29.2 g/dL — ABNORMAL LOW (ref 30.0–36.0)
MCV: 79.1 fL — ABNORMAL LOW (ref 80.0–100.0)
Platelets: 266 10*3/uL (ref 150–400)
RBC: 3.68 MIL/uL — ABNORMAL LOW (ref 3.87–5.11)
RDW: 27.3 % — ABNORMAL HIGH (ref 11.5–15.5)
WBC: 6.8 10*3/uL (ref 4.0–10.5)
nRBC: 0 % (ref 0.0–0.2)

## 2020-05-04 MED ORDER — HYDROCODONE-ACETAMINOPHEN 5-325 MG PO TABS
1.0000 | ORAL_TABLET | Freq: Four times a day (QID) | ORAL | Status: DC | PRN
  Administered 2020-05-04 – 2020-05-05 (×3): 1 via ORAL
  Filled 2020-05-04 (×3): qty 1

## 2020-05-04 MED ORDER — CLOPIDOGREL BISULFATE 75 MG PO TABS
75.0000 mg | ORAL_TABLET | Freq: Every day | ORAL | Status: DC
  Administered 2020-05-05: 75 mg via ORAL
  Filled 2020-05-04: qty 1

## 2020-05-04 NOTE — Progress Notes (Signed)
No IV access, MD aware, ok to leave out per MD.

## 2020-05-04 NOTE — Progress Notes (Signed)
Angela Jensen Follow Up Note  Patient ID: JHANVI Jensen, female   DOB: 21-Aug-1955, 65 y.o.   MRN: [id number]  HISTORY: She has no complaints today she is not short of breath.    Vitals:   05/04/20 0419 05/04/20 0754  BP: 107/76 122/85  Pulse: 92 77  Resp: 20 18  Temp: 98.4 F (36.9 C) 98.1 F (36.7 C)  SpO2: 98% 99%     EXAM:  Resp: Lungs are clear bilaterally.  No respiratory distress, normal effort. Heart:  Regular without murmurs Abd:  Abdomen is soft, non distended and non tender. No masses are palpable.  There is no rebound and no guarding.  Neurological: Alert and oriented to person, place, and time. Coordination normal.  Skin: Skin is warm and dry. No rash noted. No diaphoretic. No erythema. No pallor.  Psychiatric: Normal mood and affect. Normal behavior. Judgment and thought content normal.   There is no air leak.  Independent review of her chest x-ray shows the lung to be fully expanded.   ASSESSMENT: Iatrogenic right-sided pneumothorax secondary to poor lung biopsy   PLAN:   The patient has been on waterseal for 24 hours without an air leak and the lung is now fully expanded.  Therefore I removed her chest tube without incident today.  She should keep the current dressing in place for 48 hours at which time she can then wash over the wounds with soap and water and cover them with a Band-Aid.  She does not need any particular follow-up with thoracic surgery although she should follow-up with her oncologist after discharge    Angela Jensen, MDPatient ID: Angela Jensen, female   DOB: [birth year], 64 y.o.   MRN: [id number]

## 2020-05-04 NOTE — Progress Notes (Signed)
PROGRESS NOTE    Patient: Angela Jensen                            PCP: Ronnell Freshwater, NP                    DOB: 03-28-1956            DOA: 05/01/2020 IDP:[id number]             DOS: 05/04/2020, 11:46 AM   LOS: 1 day   Date of Service: The patient was seen and examined on 05/04/2020  Subjective:   The patient was seen and examined this morning, remained stable no acute distress.  Earlier this morning chest tube was discontinued, patient is in no respiratory distress does not have any chest pain or shortness of breath at this time. With minimal exertion on examination she became shortness of breath developed some flank side pain.  Was hypotensive mostly yesterday, blood pressure is improved this a.m. 122/85 BP meds still on hold-status post IV fluid resuscitation  Brief Narrative:   Per HPI:  Angela Jensen is a 64 y.o. 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 Patient reports a CT-guided lung biopsy of her right lower lobe on 04/24/20--   pathology is consistent with adenocarcinoma.  She reports doing well after this biopsy until the past 2 days when she  noticed significantly worsened shortness of breath from baseline. She reports difficulty ambulating to her bathroom due to the severity of her shortness of breath. She reports she "eventually" gets back to baseline after being seated. She denies any pain anywhere, particularly her chest or her abdomen or flank. She denies increased cough or sputum production, her only complaint is dyspnea.    ED Course:  Patient found to have a right pneumothorax and had a pigtail inserted in the ER.  She will be referred to observation status for further evaluation. Labs show sodium 137, potasium 4.0, chloride 100, BUN 11, creatinine 0.50, AST 26, ALT 32, WBC 10, Hemoglobin 10.5, HCT  35.7, Platelets 342, CXR reviewed by me shows right pneumothorax, approximately 40%, without evidence of tension. 12 Lead EKG reviewed by me shows sinus tachycardia   Assessment & Plan:   Principal Problem:   Pneumothorax on right Active Problems:   Essential hypertension   Diabetes (HCC)   PAD (peripheral artery disease) ([place])   Cancer of lower lobe of right lung (HCC)   Chronic obstructive pulmonary disease (HCC)   Pneumothorax after biopsy  Pneumothorax of rght lung Right pneumothorax, approximately 40%, without evidence of tension -Right-sided chest tube in place -placed on night of 05/02/2020  Following a procedure, patient is s/p CT guided lung biopsy done on 04/24/20   -CT surgery will follow with a chest x-ray today,  improved pneumothorax  05/04/2020 chest tube was discontinued by CT surgery-uneventful -Continue O2 supplements maintaining O2 sat greater 92%  -We will monitor patient closely follow-up with chest x-ray in a.m., if stable may be discharged home  Diabetes Mellitus -Continue diabetic diet -Checking CBG QA CHS, SSI coverage    HTN -hypertension -Patient was mostly hypotensive yesterday, with blood pressure as low as 86/65, BP meds on hold, status post IV fluid resuscitation -Blood pressure has improved this morning, 122/85, will  discontinue IV fluids -We will continue to - Lisisnopril/HCTZ and amlodipine   PAD -Remained stable Continue statin, Plavix and Aspirin  COPD No signs of exacerbation, on supplemental oxygen 2 L satting 90% -Anticipating to taper down to room air Continue PRN bronchodilator therapy, spiriva and inhaled steroids   Depression Stable Continue Bupropion   Lung CA Patient is s/p CT guided biopsy of right lower lung nodule and surgical pathology confirms - ADENOCARCINOMA OF THE LUNG, PAPILLARY AND SOLID PATTERNS.  Follow up with oncology as an out patient -Remained stable will follow up with primary oncologist  as an outpatient   DVT prophylaxis: SCD Code Status: Full code Family Communication:   No family member present at bedsides  The above findings and plan of care has been discussed with patient (and family )  in detail,  they expressed understanding and agreement of above. -Advance care planning has been discussed.     Disposition Plan: Back to previous home environment Consults called: CT susrgery  Disposition; Patient presenting from: Home Anticipated discharge: Back to previous setting Home in 1  days  Procedures:   No admission procedures for hospital encounter.     Antimicrobials:  Anti-infectives (From admission, onward)   None       Medication:  . aspirin EC  81 mg Oral Daily  . buPROPion  150 mg Oral Daily  . [START ON 05/05/2020] clopidogrel  75 mg Oral Daily  . gabapentin  100 mg Oral BID  . influenza vac split quadrivalent PF  0.5 mL Intramuscular Tomorrow-1000  . insulin aspart  0-15 Units Subcutaneous TID WC  . mometasone-formoterol  1 puff Inhalation BID  . potassium chloride  10 mEq Oral QODAY  . rOPINIRole  0.5 mg Oral BID  . rosuvastatin  10 mg Oral QHS  . sodium chloride flush  3 mL Intravenous Q12H  . tiotropium  18 mcg Inhalation Daily    sodium chloride, acetaminophen, albuterol, morphine injection, ondansetron **OR** ondansetron (ZOFRAN) IV, sodium chloride flush   Objective:   Vitals:   05/03/20 1527 05/03/20 1946 05/04/20 0419 05/04/20 0754  BP: 122/78 (!) 147/97 107/76 122/85  Pulse: 95 99 92 77  Resp: 16 20 20 18   Temp: 98 F (36.7 C) 97.7 F (36.5 C) 98.4 F (36.9 C) 98.1 F (36.7 C)  TempSrc: Oral  Oral Oral  SpO2: 98% 99% 98% 99%  Weight:   51.5 kg   Height:        Intake/Output Summary (Last 24 hours) at 05/04/2020 1146 Last data filed at 05/04/2020 1112 Gross per 24 hour  Intake 854.29 ml  Output 3000 ml  Net -2145.71 ml   Filed Weights   05/01/20 0938 05/04/20 0419  Weight: 49.9 kg 51.5 kg     Examination:         Physical Exam:   General:  Alert, oriented, cooperative, no distress;   HEENT:  Normocephalic, PERRL, otherwise with in Normal limits   Neuro:  CNII-XII intact. , normal motor and sensation, reflexes intact   Lungs:    Right chest wall chest tube discontinued, dressing in place, positive breath sounds clear to auscultation BL, Respirations unlabored, no wheezes / crackles  Cardio:    S1/S2, RRR, No murmure, No Rubs or Gallops   Abdomen:   Soft, non-tender, bowel sounds active all four quadrants,  no guarding or peritoneal signs.  Muscular skeletal:  Limited exam - in bed, able to move all 4 extremities, Normal strength,  2+ pulses,  symmetric, No pitting edema  Skin:  Dry, warm to touch, negative for any Rashes, No open wounds  Wounds: Please see nursing documentation             ------------------------------------------------------------------------------------------------------------------------------------------    LABs:  CBC Latest Ref Rng & Units 05/04/2020 05/02/2020 05/01/2020  WBC 4.0 - 10.5 K/uL 6.8 8.2 10.0  Hemoglobin 12.0 - 15.0 g/dL 8.5(L) 10.1(L) 10.5(L)  Hematocrit 36 - 46 % 29.1(L) 34.5(L) 35.7(L)  Platelets 150 - 400 K/uL 266 314 342   CMP Latest Ref Rng & Units 05/04/2020 05/02/2020 05/01/2020  Glucose 70 - 99 mg/dL 100(H) 123(H) 148(H)  BUN 8 - 23 mg/dL 11 15 11   Creatinine 0.44 - 1.00 mg/dL 0.53 0.59 0.50  Sodium 135 - 145 mmol/L 140 134(L) 137  Potassium 3.5 - 5.1 mmol/L 3.7 4.1 4.0  Chloride 98 - 111 mmol/L 107 98 100  CO2 22 - 32 mmol/L 28 29 28   Calcium 8.9 - 10.3 mg/dL 8.3(L) 9.0 9.0  Total Protein 6.5 - 8.1 g/dL - - 7.6  Total Bilirubin 0.3 - 1.2 mg/dL - - 0.7  Alkaline Phos 38 - 126 U/L - - 72  AST 15 - 41 U/L - - 26  ALT 0 - 44 U/L - - 32       Micro Results Recent Results (from the past 240 hour(s))  SARS Coronavirus 2 by RT PCR (hospital order, performed in [facility] hospital lab) Nasopharyngeal Nasopharyngeal Swab     Status:  None   Collection Time: 05/01/20  1:20 PM   Specimen: Nasopharyngeal Swab  Result Value Ref Range Status   SARS Coronavirus 2 NEGATIVE NEGATIVE Final    Comment: (NOTE) SARS-CoV-2 target nucleic acids are NOT DETECTED.  The SARS-CoV-2 RNA is generally detectable in upper and lower respiratory specimens during the acute phase of infection. The lowest concentration of SARS-CoV-2 viral copies this assay can detect is 250 copies / mL. A negative result does not preclude SARS-CoV-2 infection and should not be used as the sole basis for treatment or other patient management decisions.  A negative result may occur with improper specimen collection / handling, submission of specimen other than nasopharyngeal swab, presence of viral mutation(s) within the areas targeted by this assay, and inadequate number of viral copies (<250 copies / mL). A negative result must be combined with clinical observations, patient history, and epidemiological information.  Fact Sheet for Patients:   StrictlyIdeas.no  Fact Sheet for Healthcare Providers: BankingDealers.co.za  This test is not yet approved or  cleared by the [place] FDA and has been authorized for detection and/or diagnosis of SARS-CoV-2 by FDA under an Emergency Use Authorization (EUA).  This EUA will remain in effect (meaning this test can be used) for the duration of the COVID-19 declaration under Section 564(b)(1) of the Act, 21 U.S.C. section 360bbb-3(b)(1), unless the authorization is terminated or revoked sooner.  Performed at [facility], [address] [place], [place] [address]     Radiology Reports DG Chest 1 View  Result Date: 05/03/2020 CLINICAL DATA:  Right pneumothorax EXAM: CHEST  1 VIEW COMPARISON:  05/02/2020 FINDINGS: Right chest tube remains in place. No visible residual right pneumothorax. Areas of atelectasis or scarring in the right lung. Left lung  clear. Heart is normal size. IMPRESSION: Right chest tube remains in place. No visible residual pneumothorax. Electronically Signed   By: Rolm Baptise M.D.   On: 05/03/2020 05:17   DG Chest 1 View  Result Date: 05/02/2020  CLINICAL DATA:  Pneumothorax EXAM: CHEST  1 VIEW COMPARISON:  May 01, 2020 FINDINGS: Pigtail catheter present on the right with persistent small right apical pneumothorax. No tension component. Scattered areas of atelectatic change noted on the right. Lungs elsewhere are clear. Heart size and pulmonary vascularity are normal. No adenopathy. No bone lesions. IMPRESSION: Chest tube unchanged in position on the right with small right apical pneumothorax, stable. No tension component. Scattered areas of atelectatic change on the right. Lungs elsewhere clear. Stable cardiac silhouette. Electronically Signed   By: Lowella Grip III M.D.   On: 05/02/2020 09:28   DG Chest 2 View  Result Date: 05/01/2020 CLINICAL DATA:  Shortness of breath worsening since yesterday. Lung biopsy 1 week ago. EXAM: CHEST - 2 VIEW COMPARISON:  04/24/2020 FINDINGS: Heart size is normal. Left chest is clear. Right pneumothorax, approximately 40%, without evidence of tension. No hemothorax. IMPRESSION: Right pneumothorax, approximately 40%, without evidence of tension. These results were called by telephone at the time of interpretation on 05/01/2020 at 10:19 am to provider PHILLIP STAFFORD , who verbally acknowledged these results. Electronically Signed   By: Nelson Chimes M.D.   On: 05/01/2020 10:19   CT ANGIO CHEST PE W OR WO CONTRAST  Result Date: 04/11/2020 CLINICAL DATA:  Chest pain.  History of lung cancer. EXAM: CT ANGIOGRAPHY CHEST WITH CONTRAST TECHNIQUE: Multidetector CT imaging of the chest was performed using the standard protocol during bolus administration of intravenous contrast. Multiplanar CT image reconstructions and MIPs were obtained to evaluate the vascular anatomy. CONTRAST:  41mL  OMNIPAQUE IOHEXOL 350 MG/ML SOLN COMPARISON:  March 04, 2020. FINDINGS: Cardiovascular: Satisfactory opacification of the pulmonary arteries to the segmental level. No evidence of pulmonary embolism. Normal heart size. No pericardial effusion. Mediastinum/Nodes: No enlarged mediastinal, hilar, or axillary lymph nodes. Thyroid gland, trachea, and esophagus demonstrate no significant findings. Lungs/Pleura: No pneumothorax or pleural effusion is noted. Left lung is unremarkable. Stable nodular densities are noted in the right lower lobe concerning for metastatic disease. Stable scarring or fibrosis is noted in the right middle lobe. Upper Abdomen: No acute abnormality. Musculoskeletal: Old right rib fracture is noted. Review of the MIP images confirms the above findings. IMPRESSION: 1. No definite evidence of pulmonary embolus. 2. Stable nodular densities are noted in the right lower lobe concerning for metastatic disease. 3. Stable scarring or fibrosis is noted in the right middle lobe. Electronically Signed   By: Marijo Conception M.D.   On: 04/11/2020 13:49   CT Biopsy  Result Date: 04/24/2020 INDICATION: History of lung cancer, now with enlarging right lower lobe pulmonary nodules. Please perform CT-guided biopsy for tissue diagnostic purposes. EXAM: CT-GUIDED RIGHT LOWER LOBE PULMONARY BIOPSY COMPARISON:  Chest CT-03/04/2020; 07/19/2019; PET-CT-03/20/2020 MEDICATIONS: None. ANESTHESIA/SEDATION: Fentanyl 25 mcg IV; Versed 0.5 mg IV Sedation time: 16 minutes; The patient was continuously monitored during the procedure by the interventional radiology nurse under my direct supervision. CONTRAST:  None COMPLICATIONS: SIR Level A - No therapy, no consequence. Procedure complicated by development of a small amount of self limited asymptomatic Peri biopsy hemorrhage. PROCEDURE: Informed consent was obtained from the patient following an explanation of the procedure, risks, benefits and alternatives. The patient  understands,agrees and consents for the procedure. All questions were addressed. A time out was performed prior to the initiation of the procedure. The patient was positioned supine, slightly LPO on the CT table and a limited chest CT was performed for procedural planning demonstrating grossly unchanged size and appearance of right  lower lobe pulmonary nodules with dominant right lower lobe nodule measuring approximately 1.2 x 1.0 cm (image 17, series 5). The operative site was prepped and draped in the usual sterile fashion. Under sterile conditions and local anesthesia, a 17 gauge coaxial needle was advanced into the peripheral aspect of the nodule. Positioning was confirmed with intermittent CT fluoroscopy and followed by the acquisition of 2 core needle biopsies with an 18 gauge core needle biopsy device. Note, additional biopsies were unable to be acquired secondary to development of peribiopsy hemorrhage and obscuration of the nodule. The coaxial needle was removed following deployment of a [employer] plug and superficial hemostasis was achieved with manual compression. Limited post procedural chest CT demonstrated a non enlarging asymptomatic peri biopsy hemorrhage but without evidence of pneumothorax or additional complication. A dressing was placed. The patient tolerated the procedure well without immediate postprocedural complication. The patient was escorted to have an upright chest radiograph. IMPRESSION: Technically successful CT guided core needle core biopsy of indeterminate right lower lobe pulmonary nodule. Procedure complicated by development of an asymptomatic peri biopsy hemorrhage however additional samples were unable to be acquired secondary to obscuration of the nodule due to the hemorrhage. Electronically Signed   By: Sandi Mariscal M.D.   On: 04/24/2020 15:05   DG Chest Port 1 View  Result Date: 05/04/2020 CLINICAL DATA:  Postop check EXAM: PORTABLE CHEST 1 VIEW COMPARISON:  05/03/2020  FINDINGS: Right-sided pigtail pleural drainage catheter unchanged. Stable patchy density right base likely scarring. No pneumothorax. Left lung is clear. Cardiomediastinal silhouette and remainder of the exam is unchanged. IMPRESSION: Stable right base scarring. Right-sided pigtail pleural drainage catheter unchanged. No pneumothorax. Electronically Signed   By: Marin Olp M.D.   On: 05/04/2020 09:13   DG Chest Portable 1 View  Result Date: 05/01/2020 CLINICAL DATA:  Chest tube for pneumothorax. EXAM: PORTABLE CHEST 1 VIEW COMPARISON:  05/01/2020 FINDINGS: Interval placement of right pigtail chest catheter in satisfactory position. Improvement in right pneumothorax which now is small in the apex. Minimal right pleural effusion. Right lower lobe atelectasis has improved aeration. Left lung remains clear. No heart failure. IMPRESSION: Satisfactory right chest tube with improvement in right pneumothorax. Residual small right apical pneumothorax. Improved aeration right lung base. Electronically Signed   By: Franchot Gallo M.D.   On: 05/01/2020 14:40   DG Chest Port 1 View  Result Date: 04/24/2020 CLINICAL DATA:  Post right lower lobe pulmonary nodule biopsy EXAM: PORTABLE CHEST 1 VIEW COMPARISON:  02/11/2018; CT-guided right lower lobe pulmonary nodule biopsy-earlier same day FINDINGS: Grossly unchanged cardiac silhouette and mediastinal contours. The lungs remain hyperexpanded with mild diffuse slightly nodular thickening of the pulmonary interstitium. Improved aeration of the right lower lung. Known right lower lung nodular opacities are suboptimally evaluated on the present examination. Post treatment change involving the right mid lung. No pleural effusion or pneumothorax. No evidence of edema. No acute osseous abnormalities. IMPRESSION: No evidence of complication following right lower lobe pulmonary nodule biopsy. Specifically, no pneumothorax. Electronically Signed   By: Sandi Mariscal M.D.   On:  04/24/2020 14:29    SIGNED: Deatra James, MD, FACP, FHM. [facility],  Pager (please use [email or web address] to page/text)  If 7PM-7AM, please contact night-coverage [email or web address] Ota [facility] 05/04/2020, 11:46 AM

## 2020-05-05 ENCOUNTER — Inpatient Hospital Stay: Payer: BLUE CROSS/BLUE SHIELD

## 2020-05-05 LAB — CBC
HCT: 30 % — ABNORMAL LOW (ref 36.0–46.0)
Hemoglobin: 8.8 g/dL — ABNORMAL LOW (ref 12.0–15.0)
MCH: 23.2 pg — ABNORMAL LOW (ref 26.0–34.0)
MCHC: 29.3 g/dL — ABNORMAL LOW (ref 30.0–36.0)
MCV: 79.2 fL — ABNORMAL LOW (ref 80.0–100.0)
Platelets: 244 10*3/uL (ref 150–400)
RBC: 3.79 MIL/uL — ABNORMAL LOW (ref 3.87–5.11)
RDW: 27.3 % — ABNORMAL HIGH (ref 11.5–15.5)
WBC: 6.7 10*3/uL (ref 4.0–10.5)
nRBC: 0 % (ref 0.0–0.2)

## 2020-05-05 LAB — GLUCOSE, CAPILLARY
Glucose-Capillary: 97 mg/dL (ref 70–99)
Glucose-Capillary: 129 mg/dL — ABNORMAL HIGH (ref 70–99)

## 2020-05-05 LAB — BASIC METABOLIC PANEL
Anion gap: 6 (ref 5–15)
BUN: 10 mg/dL (ref 8–23)
CO2: 30 mmol/L (ref 22–32)
Calcium: 8.5 mg/dL — ABNORMAL LOW (ref 8.9–10.3)
Chloride: 105 mmol/L (ref 98–111)
Creatinine, Ser: 0.54 mg/dL (ref 0.44–1.00)
GFR calc Af Amer: >60 mL/min (ref >60)
GFR calc non Af Amer: >60 mL/min (ref >60)
Glucose, Bld: 89 mg/dL (ref 70–99)
Potassium: 3.7 mmol/L (ref 3.5–5.1)
Sodium: 141 mmol/L (ref 135–145)

## 2020-05-05 MED ORDER — AMLODIPINE BESYLATE 10 MG PO TABS
10.0000 mg | ORAL_TABLET | Freq: Every day | ORAL | Status: DC
  Administered 2020-05-05: 10 mg via ORAL
  Filled 2020-05-05: qty 1

## 2020-05-05 MED ORDER — ROSUVASTATIN CALCIUM 10 MG PO TABS
10.0000 mg | ORAL_TABLET | Freq: Every day | ORAL | 5 refills | Status: DC
Start: 2020-05-05 — End: 2020-10-30

## 2020-05-05 MED ORDER — MELOXICAM 7.5 MG PO TABS: 7.5000 mg | ORAL_TABLET | Freq: Two times a day (BID) | ORAL | 0 refills | Status: AC

## 2020-05-05 NOTE — Discharge Summary (Signed)
Physician Discharge Summary [facility]    Patient: Angela Jensen                   Admit date: 05/01/2020   DOB: 1955-10-04             Discharge date:05/05/2020/7:53 AM [id number]                          PCP: Ronnell Freshwater, NP  Disposition: Home   Recommendations for Outpatient Follow-up:   . Follow up: in 1 week  Discharge Condition: Stable   Code Status:   Code Status: Full Code  Diet recommendation: Regular healthy diet   Discharge Diagnoses:    Principal Problem:   Pneumothorax on right Active Problems:   Essential hypertension   Diabetes (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 Patient reports a CT-guided lung biopsy of her right lower lobeon 04/24/20--pathology isconsistent with adenocarcinoma.  She reports doing well after this biopsy until the past 2 dayswhenshe noticed significantly worsened shortness of breath from baseline. She reports difficulty ambulating to her bathroom due to the severity of her shortness of breath. She reports she "eventually" gets back to baseline after being seated. She denies any pain anywhere, particularly her chest or her abdomen or flank. She denies increased cough or sputum production, her only complaint is dyspnea.    ED Course:  Patient found to have a right pneumothorax and had a pigtail inserted in the ER.  She will be referred to observation status for further evaluation. Labs show sodium 137, potasium 4.0, chloride 100, BUN 11, creatinine 0.50, AST 26, ALT 32, WBC 10,  Hemoglobin 10.5, HCT 35.7, Platelets 342, CXR reviewed by me shows right pneumothorax, approximately 40%, without evidence of tension. 12 Lead EKG reviewed by me shows sinus tachycardia   Hospital course:    Pneumothorax of rght lung Right pneumothorax, approximately 40%, without evidence of tension -Right-sided chest tube in place -placed on night of 05/02/2020  Following a procedure, patient is s/p CT guided lung biopsy done on 04/24/20   -CT surgery will follow with a chest x-ray today,  improved pneumothorax  05/04/2020 chest tube was discontinued by CT surgery-uneventful -Continue O2 supplements maintaining O2 sat greater 92%  -Much improved, pneumothorax resolved, chest tube has been out since morning of 05/04/2020  Diabetes Mellitus -Continue diabetic diet -Resume home regimen    HTN -hypertension -Patient was mostly hypotensive yesterday, with blood pressure as low as 86/65, BP meds on hold, status post IV fluid resuscitation -Blood pressure has improved this morning, 122/85, will discontinue IV fluids -Resume- Lisisnopril/HCTZ and amlodipine   PAD -Remained stable Continue statin, Plavix and Aspirin  COPD No signs of exacerbation, on supplemental oxygen 2 L satting 90% -Anticipating to taper down to room air Continue PRN bronchodilator therapy, spiriva and inhaled steroids   Depression Stable Continue Bupropion   Lung CA Patient is s/p CT guided biopsy of right lower lung nodule and surgical pathology confirms - ADENOCARCINOMA OF THE LUNG,  PAPILLARY AND SOLID PATTERNS.  Follow up with oncology as an out patient -Remained stable will follow up with primary oncologist as an outpatient    Code Status:Full code Family Communication:  No family member present at bedsides  The above findings and plan of care has been discussed with patient (and family )  in detail,  they expressed understanding and agreement of above. -Advance care  planning has been discussed.     Disposition Plan:Back to previous home environment Consults called:CT susrgery  Disposition; Patient presenting from: Home   Nutritional status:          Discharge Instructions:   Discharge Instructions    Activity as tolerated - No restrictions   Complete by: As directed    Call MD for:  difficulty breathing, headache or visual disturbances   Complete by: As directed    Call MD for:  persistant dizziness or light-headedness   Complete by: As directed    Diet - low sodium heart healthy   Complete by: As directed    Discharge instructions   Complete by: As directed    Follow-up with your oncologist, PCP within 1 week   Increase activity slowly   Complete by: As directed        Medication List    TAKE these medications   Accu-Chek Aviva Plus test strip Generic drug: glucose blood Use as instructed to check blood sugars three times daily.  E11.65   Accu-Chek FastClix Lancets Misc Use as directed twice a day diag E11.65   albuterol (2.5 MG/3ML) 0.083% nebulizer solution Commonly known as: PROVENTIL Take 3 mLs (2.5 mg total) by nebulization every 6 (six) hours as needed for wheezing. What changed: Another medication with the same name was changed. Make sure you understand how and when to take each.   Ventolin HFA 108 (90 Base) MCG/ACT inhaler Generic drug: albuterol INHALE 2 PUFFS INTO LUNGS EVERY 4 HOURS AS NEEDED FOR WHEEZING OR SHORTNESS OF BREATH What changed:   how much to take  how to take this  when to take this  reasons to take this  additional instructions   amLODipine 10 MG tablet Commonly known as: NORVASC Take 1 tablet (10 mg total) by mouth every morning.   aspirin EC 81 MG tablet Take 1 tablet (81 mg total) by mouth daily.   buPROPion 150 MG 24 hr tablet Commonly known as: WELLBUTRIN XL TAKE 1 TABLET BY MOUTH TWICE DAILY FOR SMOKING CESSATION What changed:   how much to take  how to take  this  when to take this  additional instructions   clopidogrel 75 MG tablet Commonly known as: PLAVIX Take 1 tablet by mouth once daily   Dulera 200-5 MCG/ACT Aero Generic drug: mometasone-formoterol Inhale 1 puff into the lungs 2 (two) times daily. Please fill as 90 day prescription   gabapentin 100 MG capsule Commonly known as: NEURONTIN Take 1 capsule by mouth twice daily   Januvia 25 MG tablet Generic drug: sitaGLIPtin Take 1 tablet by mouth once daily What changed: how much to take   lisinopril-hydrochlorothiazide 10-12.5 MG tablet Commonly known as: ZESTORETIC Take 1 tablet by mouth every morning.   meloxicam 7.5 MG tablet Commonly known as: MOBIC Take 1 tablet (7.5 mg total) by mouth 2 (two) times daily.   metFORMIN 500 MG 24 hr tablet Commonly known as: GLUCOPHAGE-XR Take 1 tablet (500 mg total) by mouth daily with breakfast.   potassium chloride 10 MEQ tablet Commonly known as: KLOR-CON TAKE  1 TABLET BY MOUTH EVERY OTHER DAY FOR  LOW  POTASSIUM What changed:   how much to take  how to take this  when to take this  additional instructions   rOPINIRole 0.5 MG tablet Commonly known as: REQUIP Take 1 tablet (0.5 mg total) by mouth 2 (two) times daily.   rosuvastatin 10 MG tablet Commonly known as: CRESTOR Take 1 tablet (10 mg total) by mouth at bedtime.   Spiriva HandiHaler 18 MCG inhalation capsule Generic drug: tiotropium INHALE THE CONTENTS OF ONE CAPSULE ONCE DAILY. What changed: See the new instructions.       No Known Allergies   Procedures /Studies:   DG Chest 1 View  Result Date: 05/03/2020 CLINICAL DATA:  Right pneumothorax EXAM: CHEST  1 VIEW COMPARISON:  05/02/2020 FINDINGS: Right chest tube remains in place. No visible residual right pneumothorax. Areas of atelectasis or scarring in the right lung. Left lung clear. Heart is normal size. IMPRESSION: Right chest tube remains in place. No visible residual pneumothorax.  Electronically Signed   By: Rolm Baptise M.D.   On: 05/03/2020 05:17   DG Chest 1 View  Result Date: 05/02/2020 CLINICAL DATA:  Pneumothorax EXAM: CHEST  1 VIEW COMPARISON:  May 01, 2020 FINDINGS: Pigtail catheter present on the right with persistent small right apical pneumothorax. No tension component. Scattered areas of atelectatic change noted on the right. Lungs elsewhere are clear. Heart size and pulmonary vascularity are normal. No adenopathy. No bone lesions. IMPRESSION: Chest tube unchanged in position on the right with small right apical pneumothorax, stable. No tension component. Scattered areas of atelectatic change on the right. Lungs elsewhere clear. Stable cardiac silhouette. Electronically Signed   By: Lowella Grip III M.D.   On: 05/02/2020 09:28   DG Chest 2 View  Result Date: 05/01/2020 CLINICAL DATA:  Shortness of breath worsening since yesterday. Lung biopsy 1 week ago. EXAM: CHEST - 2 VIEW COMPARISON:  04/24/2020 FINDINGS: Heart size is normal. Left chest is clear. Right pneumothorax, approximately 40%, without evidence of tension. No hemothorax. IMPRESSION: Right pneumothorax, approximately 40%, without evidence of tension. These results were called by telephone at the time of interpretation on 05/01/2020 at 10:19 am to provider PHILLIP STAFFORD , who verbally acknowledged these results. Electronically Signed   By: Nelson Chimes M.D.   On: 05/01/2020 10:19   CT ANGIO CHEST PE W OR WO CONTRAST  Result Date: 04/11/2020 CLINICAL DATA:  Chest pain.  History of lung cancer. EXAM: CT ANGIOGRAPHY CHEST WITH CONTRAST TECHNIQUE: Multidetector CT imaging of the chest was performed using the standard protocol during bolus administration of intravenous contrast. Multiplanar CT image reconstructions and MIPs were obtained to evaluate the vascular anatomy. CONTRAST:  55mL OMNIPAQUE IOHEXOL 350 MG/ML SOLN COMPARISON:  March 04, 2020. FINDINGS: Cardiovascular: Satisfactory opacification of  the pulmonary arteries to the segmental level. No evidence of pulmonary embolism. Normal heart size. No pericardial effusion. Mediastinum/Nodes: No enlarged mediastinal, hilar, or axillary lymph nodes. Thyroid gland, trachea, and esophagus demonstrate no significant findings. Lungs/Pleura: No pneumothorax or pleural effusion is noted. Left lung is unremarkable. Stable nodular densities are noted in the right lower lobe concerning for metastatic disease. Stable scarring or fibrosis is noted in the right middle lobe. Upper Abdomen: No acute abnormality. Musculoskeletal: Old right rib fracture is noted. Review of the MIP images confirms the above findings. IMPRESSION: 1. No definite evidence of pulmonary embolus. 2. Stable nodular densities are noted in the right lower lobe concerning for metastatic disease.  3. Stable scarring or fibrosis is noted in the right middle lobe. Electronically Signed   By: Marijo Conception M.D.   On: 04/11/2020 13:49   CT Biopsy  Result Date: 04/24/2020 INDICATION: History of lung cancer, now with enlarging right lower lobe pulmonary nodules. Please perform CT-guided biopsy for tissue diagnostic purposes. EXAM: CT-GUIDED RIGHT LOWER LOBE PULMONARY BIOPSY COMPARISON:  Chest CT-03/04/2020; 07/19/2019; PET-CT-03/20/2020 MEDICATIONS: None. ANESTHESIA/SEDATION: Fentanyl 25 mcg IV; Versed 0.5 mg IV Sedation time: 16 minutes; The patient was continuously monitored during the procedure by the interventional radiology nurse under my direct supervision. CONTRAST:  None COMPLICATIONS: SIR Level A - No therapy, no consequence. Procedure complicated by development of a small amount of self limited asymptomatic Peri biopsy hemorrhage. PROCEDURE: Informed consent was obtained from the patient following an explanation of the procedure, risks, benefits and alternatives. The patient understands,agrees and consents for the procedure. All questions were addressed. A time out was performed prior to the  initiation of the procedure. The patient was positioned supine, slightly LPO on the CT table and a limited chest CT was performed for procedural planning demonstrating grossly unchanged size and appearance of right lower lobe pulmonary nodules with dominant right lower lobe nodule measuring approximately 1.2 x 1.0 cm (image 17, series 5). The operative site was prepped and draped in the usual sterile fashion. Under sterile conditions and local anesthesia, a 17 gauge coaxial needle was advanced into the peripheral aspect of the nodule. Positioning was confirmed with intermittent CT fluoroscopy and followed by the acquisition of 2 core needle biopsies with an 18 gauge core needle biopsy device. Note, additional biopsies were unable to be acquired secondary to development of peribiopsy hemorrhage and obscuration of the nodule. The coaxial needle was removed following deployment of a Biosentry plug and superficial hemostasis was achieved with manual compression. Limited post procedural chest CT demonstrated a non enlarging asymptomatic peri biopsy hemorrhage but without evidence of pneumothorax or additional complication. A dressing was placed. The patient tolerated the procedure well without immediate postprocedural complication. The patient was escorted to have an upright chest radiograph. IMPRESSION: Technically successful CT guided core needle core biopsy of indeterminate right lower lobe pulmonary nodule. Procedure complicated by development of an asymptomatic peri biopsy hemorrhage however additional samples were unable to be acquired secondary to obscuration of the nodule due to the hemorrhage. Electronically Signed   By: Sandi Mariscal M.D.   On: 04/24/2020 15:05   DG Chest Port 1 View  Result Date: 05/04/2020 CLINICAL DATA:  Postop check EXAM: PORTABLE CHEST 1 VIEW COMPARISON:  05/03/2020 FINDINGS: Right-sided pigtail pleural drainage catheter unchanged. Stable patchy density right base likely scarring. No  pneumothorax. Left lung is clear. Cardiomediastinal silhouette and remainder of the exam is unchanged. IMPRESSION: Stable right base scarring. Right-sided pigtail pleural drainage catheter unchanged. No pneumothorax. Electronically Signed   By: Marin Olp M.D.   On: 05/04/2020 09:13   DG Chest Portable 1 View  Result Date: 05/01/2020 CLINICAL DATA:  Chest tube for pneumothorax. EXAM: PORTABLE CHEST 1 VIEW COMPARISON:  05/01/2020 FINDINGS: Interval placement of right pigtail chest catheter in satisfactory position. Improvement in right pneumothorax which now is small in the apex. Minimal right pleural effusion. Right lower lobe atelectasis has improved aeration. Left lung remains clear. No heart failure. IMPRESSION: Satisfactory right chest tube with improvement in right pneumothorax. Residual small right apical pneumothorax. Improved aeration right lung base. Electronically Signed   By: Franchot Gallo M.D.   On: 05/01/2020 14:40  DG Chest Port 1 View  Result Date: 04/24/2020 CLINICAL DATA:  Post right lower lobe pulmonary nodule biopsy EXAM: PORTABLE CHEST 1 VIEW COMPARISON:  02/11/2018; CT-guided right lower lobe pulmonary nodule biopsy-earlier same day FINDINGS: Grossly unchanged cardiac silhouette and mediastinal contours. The lungs remain hyperexpanded with mild diffuse slightly nodular thickening of the pulmonary interstitium. Improved aeration of the right lower lung. Known right lower lung nodular opacities are suboptimally evaluated on the present examination. Post treatment change involving the right mid lung. No pleural effusion or pneumothorax. No evidence of edema. No acute osseous abnormalities. IMPRESSION: No evidence of complication following right lower lobe pulmonary nodule biopsy. Specifically, no pneumothorax. Electronically Signed   By: Sandi Mariscal M.D.   On: 04/24/2020 14:29     Subjective:   Patient was seen and examined 05/05/2020, 7:53 AM Patient stable today. No acute  distress.  No issues overnight Stable for discharge.  Discharge Exam:    Vitals:   05/04/20 2027 05/05/20 0340 05/05/20 0529 05/05/20 0740  BP: (!) 135/97 130/80  (!) 185/90  Pulse: (!) 106 89  93  Resp: 17 19  17   Temp: 98.7 F (37.1 C) 97.8 F (36.6 C)  97.9 F (36.6 C)  TempSrc: Oral Oral    SpO2: 94% 100%  96%  Weight:   51.7 kg   Height:        General: Pt lying comfortably in bed & appears in no obvious distress. Cardiovascular: S1 & S2 heard, RRR, S1/S2 +. No murmurs, rubs, gallops or clicks. No JVD or pedal edema. Respiratory: Clear to auscultation without wheezing, rhonchi or crackles. No increased work of breathing. Abdominal:  Non-distended, non-tender & soft. No organomegaly or masses appreciated. Normal bowel sounds heard. CNS: Alert and oriented. No focal deficits. Extremities: no edema, no cyanosis    The results of significant diagnostics from this hospitalization (including imaging, microbiology, ancillary and laboratory) are listed below for reference.      Microbiology:   Recent Results (from the past 240 hour(s))  SARS Coronavirus 2 by RT PCR (hospital order, performed in [facility] hospital lab) Nasopharyngeal Nasopharyngeal Swab     Status: None   Collection Time: 05/01/20  1:20 PM   Specimen: Nasopharyngeal Swab  Result Value Ref Range Status   SARS Coronavirus 2 NEGATIVE NEGATIVE Final    Comment: (NOTE) SARS-CoV-2 target nucleic acids are NOT DETECTED.  The SARS-CoV-2 RNA is generally detectable in upper and lower respiratory specimens during the acute phase of infection. The lowest concentration of SARS-CoV-2 viral copies this assay can detect is 250 copies / mL. A negative result does not preclude SARS-CoV-2 infection and should not be used as the sole basis for treatment or other patient management decisions.  A negative result may occur with improper specimen collection / handling, submission of specimen other than nasopharyngeal swab,  presence of viral mutation(s) within the areas targeted by this assay, and inadequate number of viral copies (<250 copies / mL). A negative result must be combined with clinical observations, patient history, and epidemiological information.  Fact Sheet for Patients:   StrictlyIdeas.no  Fact Sheet for Healthcare Providers: BankingDealers.co.za  This test is not yet approved or  cleared by the [place] FDA and has been authorized for detection and/or diagnosis of SARS-CoV-2 by FDA under an Emergency Use Authorization (EUA).  This EUA will remain in effect (meaning this test can be used) for the duration of the COVID-19 declaration under Section 564(b)(1) of the Act, 21 U.S.C.  section 360bbb-3(b)(1), unless the authorization is terminated or revoked sooner.  Performed at [facility], [address] [place], [place] [address]      Labs:   CBC: Recent Labs  Lab 04/29/20 1354 05/01/20 0949 05/02/20 0607 05/04/20 0559 05/05/20 0558  WBC 10.6* 10.0 8.2 6.8 6.7  NEUTROABS  --  7.8*  --   --   --   HGB 10.9* 10.5* 10.1* 8.5* 8.8*  HCT 36.2 35.7* 34.5* 29.1* 30.0*  MCV 75.7* 76.3* 78.1* 79.1* 79.2*  PLT 445* 342 314 266 191   Basic Metabolic Panel: Recent Labs  Lab 05/01/20 0949 05/02/20 0607 05/04/20 0559  NA 137 134* 140  K 4.0 4.1 3.7  CL 100 98 107  CO2 28 29 28   GLUCOSE 148* 123* 100*  BUN 11 15 11   CREATININE 0.50 0.59 0.53  CALCIUM 9.0 9.0 8.3*   Liver Function Tests: Recent Labs  Lab 05/01/20 0949  AST 26  ALT 32  ALKPHOS 72  BILITOT 0.7  PROT 7.6  ALBUMIN 4.1   BNP (last 3 results) No results for input(s): BNP in the last 8760 hours. Cardiac Enzymes: No results for input(s): CKTOTAL, CKMB, CKMBINDEX, TROPONINI in the last 168 hours. CBG: Recent Labs  Lab 05/04/20 0755 05/04/20 1210 05/04/20 1637 05/04/20 2101 05/05/20 0738  GLUCAP 102* 129* 112* 106* 97   Hgb A1c No results  for input(s): HGBA1C in the last 72 hours. Lipid Profile No results for input(s): CHOL, HDL, LDLCALC, TRIG, CHOLHDL, LDLDIRECT in the last 72 hours. Thyroid function studies No results for input(s): TSH, T4TOTAL, T3FREE, THYROIDAB in the last 72 hours.  Invalid input(s): FREET3 Anemia work up No results for input(s): VITAMINB12, FOLATE, FERRITIN, TIBC, IRON, RETICCTPCT in the last 72 hours. Urinalysis    Component Value Date/Time   APPEARANCEUR Clear 01/01/2020 1402   GLUCOSEU Negative 01/01/2020 1402   BILIRUBINUR Negative 01/01/2020 1402   PROTEINUR Trace 01/01/2020 1402   NITRITE Negative 01/01/2020 1402   LEUKOCYTESUR Negative 01/01/2020 1402         Time coordinating discharge: Over 45 minutes  SIGNED: Deatra James, MD, FACP, FHM. [facility],  Please use [email or web address] to Page If 7PM-7AM, please contact night-coverage [email or web address] [facility] 05/05/2020, 7:53 AM

## 2020-05-05 NOTE — Discharge Summary (Signed)
Physician Discharge Summary [facility]    Patient: Angela Jensen                   Admit date: 05/01/2020   DOB: 1956/02/15             Discharge date:05/05/2020/10:17 AM HUT:[id number]                          PCP: Ronnell Freshwater, NP  Disposition: HOME  Recommendations for Outpatient Follow-up:   . Follow up: in 1 week  Discharge Condition: Stable   Code Status:   Code Status: Full Code  Diet recommendation: Regular healthy diet   Discharge Diagnoses:    Principal Problem:   Pneumothorax on right Active Problems:   Essential hypertension   Diabetes ([place])   PAD (peripheral artery disease) ([place])   Cancer of lower lobe of right lung ([place])   Chronic obstructive pulmonary disease ([place])   Pneumothorax after biopsy   History of Present Illness/ Hospital Course Kathleen Argue Summary:   Per HPI: Alyla Pietila McGeeis a 64 y.o.f w PMH/o COPD, chronic respiratory failure on 2L of oxygen at night, hitsory of Lung CA (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 Patient reports a CT-guided lung biopsy of her right lower lobeon 04/24/20--pathology isconsistent with adenocarcinoma.  She reports doing well after this biopsy until the past 2 dayswhenshe noticed significantly worsened shortness of breath from baseline. She reports difficulty ambulating to her bathroom due to the severity of her shortness of breath. She reports she "eventually" gets back to baseline after being seated. She denies any pain anywhere, particularly her chest or her abdomen or flank. She denies increased cough or sputum production, her only complaint is dyspnea.    ED Course:  Patient found to have a right pneumothorax and had a pigtail inserted in the ER.  She will be referred to observation status for further evaluation. Labs show sodium 137, potasium 4.0, chloride 100, BUN 11, creatinine 0.50, AST 26, ALT 32, WBC 10,  Hemoglobin 10.5, HCT 35.7, Platelets 342, CXR reviewed by me shows right pneumothorax, approximately 40%, without evidence of tension. 12 Lead EKG reviewed by me shows sinus tachycardia   Hospital course   Pneumothorax of rght lung Resolved last chest x-ray 05/05/2020 showed marked improvement,   Right pneumothorax, approximately 40%, without evidence of tension -Right-sided chest tube in place -placed on night of 05/02/2020  Following a procedure, patient is s/p CT guided lung biopsy done on 04/24/20   -CT surgery will follow with a chest x-ray today,  improved pneumothorax  05/04/2020 chest tube was discontinued by CT surgery-uneventful -Continue O2 supplements maintaining O2 sat greater 92%  -We will monitor patient closely follow-up with chest x-ray in a.m., if stable may be discharged home  Diabetes Mellitus Resume diabetic diet, resume home medication    HTN -hypotension Hypotension resolved -Resuming home medications - Lisisnopril/HCTZ and amlodipine   PAD -Remained stable Continue statin, Plavix and Aspirin  COPD No signs of exacerbation, on supplemental oxygen 2 L satting 90% -Anticipating to taper down to room air Continue PRN bronchodilator therapy, spiriva and inhaled steroids   Depression Stable Continue Bupropion   Lung CA Patient is s/p CT guided biopsy of right lower lung nodule and surgical pathology confirms - ADENOCARCINOMA OF THE LUNG, PAPILLARY AND SOLID PATTERNS.  Follow up with oncology as an out patient -Remained stable  will follow up with primary oncologist as an outpatient    Family Communication:  No family member present at bedsides  The above findings and plan of care has been discussed with patient (and family )  in detail,  they expressed understanding and agreement of above. -Advance care planning has been discussed.     Disposition Plan:Back to previous home environment-Home Consults called:CT  susrgery      Discharge Instructions:   Discharge Instructions    Activity as tolerated - No restrictions   Complete by: As directed    Call MD for:  difficulty breathing, headache or visual disturbances   Complete by: As directed    Call MD for:  persistant dizziness or light-headedness   Complete by: As directed    Diet - low sodium heart healthy   Complete by: As directed    Discharge instructions   Complete by: As directed    Follow-up with your oncologist, PCP within 1 week   Increase activity slowly   Complete by: As directed        Medication List    TAKE these medications   Accu-Chek Aviva Plus test strip Generic drug: glucose blood Use as instructed to check blood sugars three times daily.  E11.65   Accu-Chek FastClix Lancets Misc Use as directed twice a day diag E11.65   albuterol (2.5 MG/3ML) 0.083% nebulizer solution Commonly known as: PROVENTIL Take 3 mLs (2.5 mg total) by nebulization every 6 (six) hours as needed for wheezing. What changed: Another medication with the same name was changed. Make sure you understand how and when to take each.   Ventolin HFA 108 (90 Base) MCG/ACT inhaler Generic drug: albuterol INHALE 2 PUFFS INTO LUNGS EVERY 4 HOURS AS NEEDED FOR WHEEZING OR SHORTNESS OF BREATH What changed:   how much to take  how to take this  when to take this  reasons to take this  additional instructions   amLODipine 10 MG tablet Commonly known as: NORVASC Take 1 tablet (10 mg total) by mouth every morning.   aspirin EC 81 MG tablet Take 1 tablet (81 mg total) by mouth daily.   buPROPion 150 MG 24 hr tablet Commonly known as: WELLBUTRIN XL TAKE 1 TABLET BY MOUTH TWICE DAILY FOR SMOKING CESSATION What changed:   how much to take  how to take this  when to take this  additional instructions   clopidogrel 75 MG tablet Commonly known as: PLAVIX Take 1 tablet by mouth once daily   Dulera 200-5 MCG/ACT Aero Generic drug:  mometasone-formoterol Inhale 1 puff into the lungs 2 (two) times daily. Please fill as 90 day prescription   gabapentin 100 MG capsule Commonly known as: NEURONTIN Take 1 capsule by mouth twice daily   Januvia 25 MG tablet Generic drug: sitaGLIPtin Take 1 tablet by mouth once daily What changed: how much to take   lisinopril-hydrochlorothiazide 10-12.5 MG tablet Commonly known as: ZESTORETIC Take 1 tablet by mouth every morning.   meloxicam 7.5 MG tablet Commonly known as: MOBIC Take 1 tablet (7.5 mg total) by mouth 2 (two) times daily.   metFORMIN 500 MG 24 hr tablet Commonly known as: GLUCOPHAGE-XR Take 1 tablet (500 mg total) by mouth daily with breakfast.   potassium chloride 10 MEQ tablet Commonly known as: KLOR-CON TAKE 1 TABLET BY MOUTH EVERY OTHER DAY FOR  LOW  POTASSIUM What changed:   how much to take  how to take this  when to take this  additional  instructions   rOPINIRole 0.5 MG tablet Commonly known as: REQUIP Take 1 tablet (0.5 mg total) by mouth 2 (two) times daily.   rosuvastatin 10 MG tablet Commonly known as: CRESTOR Take 1 tablet (10 mg total) by mouth at bedtime.   Spiriva HandiHaler 18 MCG inhalation capsule Generic drug: tiotropium INHALE THE CONTENTS OF ONE CAPSULE ONCE DAILY. What changed: See the new instructions.       No Known Allergies   Procedures /Studies:   DG Chest 1 View  Result Date: 05/03/2020 CLINICAL DATA:  Right pneumothorax EXAM: CHEST  1 VIEW COMPARISON:  05/02/2020 FINDINGS: Right chest tube remains in place. No visible residual right pneumothorax. Areas of atelectasis or scarring in the right lung. Left lung clear. Heart is normal size. IMPRESSION: Right chest tube remains in place. No visible residual pneumothorax. Electronically Signed   By: Rolm Baptise M.D.   On: 05/03/2020 05:17   DG Chest 1 View  Result Date: 05/02/2020 CLINICAL DATA:  Pneumothorax EXAM: CHEST  1 VIEW COMPARISON:  May 01, 2020  FINDINGS: Pigtail catheter present on the right with persistent small right apical pneumothorax. No tension component. Scattered areas of atelectatic change noted on the right. Lungs elsewhere are clear. Heart size and pulmonary vascularity are normal. No adenopathy. No bone lesions. IMPRESSION: Chest tube unchanged in position on the right with small right apical pneumothorax, stable. No tension component. Scattered areas of atelectatic change on the right. Lungs elsewhere clear. Stable cardiac silhouette. Electronically Signed   By: Lowella Grip III M.D.   On: 05/02/2020 09:28   DG Chest 2 View  Result Date: 05/01/2020 CLINICAL DATA:  Shortness of breath worsening since yesterday. Lung biopsy 1 week ago. EXAM: CHEST - 2 VIEW COMPARISON:  04/24/2020 FINDINGS: Heart size is normal. Left chest is clear. Right pneumothorax, approximately 40%, without evidence of tension. No hemothorax. IMPRESSION: Right pneumothorax, approximately 40%, without evidence of tension. These results were called by telephone at the time of interpretation on 05/01/2020 at 10:19 am to provider PHILLIP STAFFORD , who verbally acknowledged these results. Electronically Signed   By: Nelson Chimes M.D.   On: 05/01/2020 10:19   CT ANGIO CHEST PE W OR WO CONTRAST  Result Date: 04/11/2020 CLINICAL DATA:  Chest pain.  History of lung cancer. EXAM: CT ANGIOGRAPHY CHEST WITH CONTRAST TECHNIQUE: Multidetector CT imaging of the chest was performed using the standard protocol during bolus administration of intravenous contrast. Multiplanar CT image reconstructions and MIPs were obtained to evaluate the vascular anatomy. CONTRAST:  13mL OMNIPAQUE IOHEXOL 350 MG/ML SOLN COMPARISON:  March 04, 2020. FINDINGS: Cardiovascular: Satisfactory opacification of the pulmonary arteries to the segmental level. No evidence of pulmonary embolism. Normal heart size. No pericardial effusion. Mediastinum/Nodes: No enlarged mediastinal, hilar, or axillary lymph  nodes. Thyroid gland, trachea, and esophagus demonstrate no significant findings. Lungs/Pleura: No pneumothorax or pleural effusion is noted. Left lung is unremarkable. Stable nodular densities are noted in the right lower lobe concerning for metastatic disease. Stable scarring or fibrosis is noted in the right middle lobe. Upper Abdomen: No acute abnormality. Musculoskeletal: Old right rib fracture is noted. Review of the MIP images confirms the above findings. IMPRESSION: 1. No definite evidence of pulmonary embolus. 2. Stable nodular densities are noted in the right lower lobe concerning for metastatic disease. 3. Stable scarring or fibrosis is noted in the right middle lobe. Electronically Signed   By: Marijo Conception M.D.   On: 04/11/2020 13:49   CT Biopsy  Result Date: 04/24/2020 INDICATION: History of lung cancer, now with enlarging right lower lobe pulmonary nodules. Please perform CT-guided biopsy for tissue diagnostic purposes. EXAM: CT-GUIDED RIGHT LOWER LOBE PULMONARY BIOPSY COMPARISON:  Chest CT-03/04/2020; 07/19/2019; PET-CT-03/20/2020 MEDICATIONS: None. ANESTHESIA/SEDATION: Fentanyl 25 mcg IV; Versed 0.5 mg IV Sedation time: 16 minutes; The patient was continuously monitored during the procedure by the interventional radiology nurse under my direct supervision. CONTRAST:  None COMPLICATIONS: SIR Level A - No therapy, no consequence. Procedure complicated by development of a small amount of self limited asymptomatic Peri biopsy hemorrhage. PROCEDURE: Informed consent was obtained from the patient following an explanation of the procedure, risks, benefits and alternatives. The patient understands,agrees and consents for the procedure. All questions were addressed. A time out was performed prior to the initiation of the procedure. The patient was positioned supine, slightly LPO on the CT table and a limited chest CT was performed for procedural planning demonstrating grossly unchanged size and  appearance of right lower lobe pulmonary nodules with dominant right lower lobe nodule measuring approximately 1.2 x 1.0 cm (image 17, series 5). The operative site was prepped and draped in the usual sterile fashion. Under sterile conditions and local anesthesia, a 17 gauge coaxial needle was advanced into the peripheral aspect of the nodule. Positioning was confirmed with intermittent CT fluoroscopy and followed by the acquisition of 2 core needle biopsies with an 18 gauge core needle biopsy device. Note, additional biopsies were unable to be acquired secondary to development of peribiopsy hemorrhage and obscuration of the nodule. The coaxial needle was removed following deployment of a Biosentry plug and superficial hemostasis was achieved with manual compression. Limited post procedural chest CT demonstrated a non enlarging asymptomatic peri biopsy hemorrhage but without evidence of pneumothorax or additional complication. A dressing was placed. The patient tolerated the procedure well without immediate postprocedural complication. The patient was escorted to have an upright chest radiograph. IMPRESSION: Technically successful CT guided core needle core biopsy of indeterminate right lower lobe pulmonary nodule. Procedure complicated by development of an asymptomatic peri biopsy hemorrhage however additional samples were unable to be acquired secondary to obscuration of the nodule due to the hemorrhage. Electronically Signed   By: Sandi Mariscal M.D.   On: 04/24/2020 15:05   DG Chest Port 1 View  Result Date: 05/05/2020 CLINICAL DATA:  Pneumothorax after biopsy 04/24/2020. Chest tube placed 05/01/2020 and removed 05/04/2020. EXAM: PORTABLE CHEST 1 VIEW COMPARISON:  05/04/2020 and 05/02/2020 FINDINGS: Lungs are adequately inflated. Minimal stable patchy density over the right mid to lower lung. No evidence of right-sided pneumothorax. Left lung is clear. Cardiomediastinal silhouette and remainder of the exam is  unchanged. IMPRESSION: 1. No acute findings. 2. Stable patchy density over the right mid to lower lung. No pneumothorax. Electronically Signed   By: Marin Olp M.D.   On: 05/05/2020 09:43   DG Chest Port 1 View  Result Date: 05/04/2020 CLINICAL DATA:  Postop check EXAM: PORTABLE CHEST 1 VIEW COMPARISON:  05/03/2020 FINDINGS: Right-sided pigtail pleural drainage catheter unchanged. Stable patchy density right base likely scarring. No pneumothorax. Left lung is clear. Cardiomediastinal silhouette and remainder of the exam is unchanged. IMPRESSION: Stable right base scarring. Right-sided pigtail pleural drainage catheter unchanged. No pneumothorax. Electronically Signed   By: Marin Olp M.D.   On: 05/04/2020 09:13   DG Chest Portable 1 View  Result Date: 05/01/2020 CLINICAL DATA:  Chest tube for pneumothorax. EXAM: PORTABLE CHEST 1 VIEW COMPARISON:  05/01/2020 FINDINGS: Interval placement of right pigtail  chest catheter in satisfactory position. Improvement in right pneumothorax which now is small in the apex. Minimal right pleural effusion. Right lower lobe atelectasis has improved aeration. Left lung remains clear. No heart failure. IMPRESSION: Satisfactory right chest tube with improvement in right pneumothorax. Residual small right apical pneumothorax. Improved aeration right lung base. Electronically Signed   By: Franchot Gallo M.D.   On: 05/01/2020 14:40   DG Chest Port 1 View  Result Date: 04/24/2020 CLINICAL DATA:  Post right lower lobe pulmonary nodule biopsy EXAM: PORTABLE CHEST 1 VIEW COMPARISON:  02/11/2018; CT-guided right lower lobe pulmonary nodule biopsy-earlier same day FINDINGS: Grossly unchanged cardiac silhouette and mediastinal contours. The lungs remain hyperexpanded with mild diffuse slightly nodular thickening of the pulmonary interstitium. Improved aeration of the right lower lung. Known right lower lung nodular opacities are suboptimally evaluated on the present examination.  Post treatment change involving the right mid lung. No pleural effusion or pneumothorax. No evidence of edema. No acute osseous abnormalities. IMPRESSION: No evidence of complication following right lower lobe pulmonary nodule biopsy. Specifically, no pneumothorax. Electronically Signed   By: Sandi Mariscal M.D.   On: 04/24/2020 14:29     Subjective:   Patient was seen and examined 05/05/2020, 10:17 AM Patient stable today. No acute distress.  No issues overnight Stable for discharge.  Discharge Exam:    Vitals:   05/04/20 2027 05/05/20 0340 05/05/20 0529 05/05/20 0740  BP: (!) 135/97 130/80  (!) 185/90  Pulse: (!) 106 89  93  Resp: 17 19  17   Temp: 98.7 F (37.1 C) 97.8 F (36.6 C)  97.9 F (36.6 C)  TempSrc: Oral Oral    SpO2: 94% 100%  96%  Weight:   51.7 kg   Height:        General: Pt lying comfortably in bed & appears in no obvious distress. Cardiovascular: S1 & S2 heard, RRR, S1/S2 +. No murmurs, rubs, gallops or clicks. No JVD or pedal edema. Respiratory: Clear to auscultation without wheezing, rhonchi or crackles. No increased work of breathing. Abdominal:  Non-distended, non-tender & soft. No organomegaly or masses appreciated. Normal bowel sounds heard. CNS: Alert and oriented. No focal deficits. Extremities: no edema, no cyanosis    The results of significant diagnostics from this hospitalization (including imaging, microbiology, ancillary and laboratory) are listed below for reference.      Microbiology:   Recent Results (from the past 240 hour(s))  SARS Coronavirus 2 by RT PCR (hospital order, performed in [facility] hospital lab) Nasopharyngeal Nasopharyngeal Swab     Status: None   Collection Time: 05/01/20  1:20 PM   Specimen: Nasopharyngeal Swab  Result Value Ref Range Status   SARS Coronavirus 2 NEGATIVE NEGATIVE Final    Comment: (NOTE) SARS-CoV-2 target nucleic acids are NOT DETECTED.  The SARS-CoV-2 RNA is generally detectable in upper and  lower respiratory specimens during the acute phase of infection. The lowest concentration of SARS-CoV-2 viral copies this assay can detect is 250 copies / mL. A negative result does not preclude SARS-CoV-2 infection and should not be used as the sole basis for treatment or other patient management decisions.  A negative result may occur with improper specimen collection / handling, submission of specimen other than nasopharyngeal swab, presence of viral mutation(s) within the areas targeted by this assay, and inadequate number of viral copies (<250 copies / mL). A negative result must be combined with clinical observations, patient history, and epidemiological information.  Fact Sheet for Patients:  StrictlyIdeas.no  Fact Sheet for Healthcare Providers: BankingDealers.co.za  This test is not yet approved or  cleared by the [place] FDA and has been authorized for detection and/or diagnosis of SARS-CoV-2 by FDA under an Emergency Use Authorization (EUA).  This EUA will remain in effect (meaning this test can be used) for the duration of the COVID-19 declaration under Section 564(b)(1) of the Act, 21 U.S.C. section 360bbb-3(b)(1), unless the authorization is terminated or revoked sooner.  Performed at [facility], [address] [place], [place] [address]      Labs:   CBC: Recent Labs  Lab 04/29/20 1354 05/01/20 0949 05/02/20 0607 05/04/20 0559 05/05/20 0558  WBC 10.6* 10.0 8.2 6.8 6.7  NEUTROABS  --  7.8*  --   --   --   HGB 10.9* 10.5* 10.1* 8.5* 8.8*  HCT 36.2 35.7* 34.5* 29.1* 30.0*  MCV 75.7* 76.3* 78.1* 79.1* 79.2*  PLT 445* 342 314 266 333   Basic Metabolic Panel: Recent Labs  Lab 05/01/20 0949 05/02/20 0607 05/04/20 0559 05/05/20 0558  NA 137 134* 140 141  K 4.0 4.1 3.7 3.7  CL 100 98 107 105  CO2 28 29 28 30   GLUCOSE 148* 123* 100* 89  BUN 11 15 11 10   CREATININE 0.50 0.59 0.53 0.54   CALCIUM 9.0 9.0 8.3* 8.5*   Liver Function Tests: Recent Labs  Lab 05/01/20 0949  AST 26  ALT 32  ALKPHOS 72  BILITOT 0.7  PROT 7.6  ALBUMIN 4.1   BNP (last 3 results) No results for input(s): BNP in the last 8760 hours. Cardiac Enzymes: No results for input(s): CKTOTAL, CKMB, CKMBINDEX, TROPONINI in the last 168 hours. CBG: Recent Labs  Lab 05/04/20 0755 05/04/20 1210 05/04/20 1637 05/04/20 2101 05/05/20 0738  GLUCAP 102* 129* 112* 106* 97   Hgb A1c No results for input(s): HGBA1C in the last 72 hours. Lipid Profile No results for input(s): CHOL, HDL, LDLCALC, TRIG, CHOLHDL, LDLDIRECT in the last 72 hours. Thyroid function studies No results for input(s): TSH, T4TOTAL, T3FREE, THYROIDAB in the last 72 hours.  Invalid input(s): FREET3 Anemia work up No results for input(s): VITAMINB12, FOLATE, FERRITIN, TIBC, IRON, RETICCTPCT in the last 72 hours. Urinalysis    Component Value Date/Time   APPEARANCEUR Clear 01/01/2020 1402   GLUCOSEU Negative 01/01/2020 1402   BILIRUBINUR Negative 01/01/2020 1402   PROTEINUR Trace 01/01/2020 1402   NITRITE Negative 01/01/2020 1402   LEUKOCYTESUR Negative 01/01/2020 1402         Time coordinating discharge: Over 45 minutes  SIGNED: Deatra James, MD, FACP, FHM. [facility],  Please use [email or web address] to Page If 7PM-7AM, please contact night-coverage [email or web address] Ota [facility] 05/05/2020, 10:17 AM

## 2020-05-06 ENCOUNTER — Other Ambulatory Visit: Payer: Self-pay | Admitting: Internal Medicine

## 2020-05-06 ENCOUNTER — Other Ambulatory Visit: Payer: Self-pay

## 2020-05-06 ENCOUNTER — Inpatient Hospital Stay: Payer: BLUE CROSS/BLUE SHIELD

## 2020-05-06 DIAGNOSIS — Z79899 Other long term (current) drug therapy: Secondary | ICD-10-CM | POA: Diagnosis not present

## 2020-05-06 DIAGNOSIS — M1990 Unspecified osteoarthritis, unspecified site: Secondary | ICD-10-CM | POA: Diagnosis not present

## 2020-05-06 DIAGNOSIS — G4730 Sleep apnea, unspecified: Secondary | ICD-10-CM | POA: Diagnosis not present

## 2020-05-06 DIAGNOSIS — Z7982 Long term (current) use of aspirin: Secondary | ICD-10-CM | POA: Diagnosis not present

## 2020-05-06 DIAGNOSIS — Z923 Personal history of irradiation: Secondary | ICD-10-CM | POA: Diagnosis not present

## 2020-05-06 DIAGNOSIS — Z7951 Long term (current) use of inhaled steroids: Secondary | ICD-10-CM | POA: Diagnosis not present

## 2020-05-06 DIAGNOSIS — C3431 Malignant neoplasm of lower lobe, right bronchus or lung: Secondary | ICD-10-CM | POA: Diagnosis not present

## 2020-05-06 DIAGNOSIS — E1151 Type 2 diabetes mellitus with diabetic peripheral angiopathy without gangrene: Secondary | ICD-10-CM | POA: Diagnosis not present

## 2020-05-06 DIAGNOSIS — Z87891 Personal history of nicotine dependence: Secondary | ICD-10-CM | POA: Diagnosis not present

## 2020-05-06 DIAGNOSIS — Z791 Long term (current) use of non-steroidal anti-inflammatories (NSAID): Secondary | ICD-10-CM | POA: Diagnosis not present

## 2020-05-06 DIAGNOSIS — Z7902 Long term (current) use of antithrombotics/antiplatelets: Secondary | ICD-10-CM | POA: Diagnosis not present

## 2020-05-06 DIAGNOSIS — Z9981 Dependence on supplemental oxygen: Secondary | ICD-10-CM | POA: Diagnosis not present

## 2020-05-06 DIAGNOSIS — C3411 Malignant neoplasm of upper lobe, right bronchus or lung: Secondary | ICD-10-CM | POA: Diagnosis not present

## 2020-05-06 DIAGNOSIS — I10 Essential (primary) hypertension: Secondary | ICD-10-CM | POA: Diagnosis not present

## 2020-05-06 DIAGNOSIS — E7800 Pure hypercholesterolemia, unspecified: Secondary | ICD-10-CM | POA: Diagnosis not present

## 2020-05-06 DIAGNOSIS — F329 Major depressive disorder, single episode, unspecified: Secondary | ICD-10-CM | POA: Diagnosis not present

## 2020-05-06 DIAGNOSIS — Z7984 Long term (current) use of oral hypoglycemic drugs: Secondary | ICD-10-CM | POA: Diagnosis not present

## 2020-05-06 DIAGNOSIS — J449 Chronic obstructive pulmonary disease, unspecified: Secondary | ICD-10-CM | POA: Diagnosis not present

## 2020-05-06 DIAGNOSIS — D509 Iron deficiency anemia, unspecified: Secondary | ICD-10-CM | POA: Diagnosis not present

## 2020-05-06 LAB — CBC
HCT: 33 % — ABNORMAL LOW (ref 36.0–46.0)
Hemoglobin: 10 g/dL — ABNORMAL LOW (ref 12.0–15.0)
MCH: 23.8 pg — ABNORMAL LOW (ref 26.0–34.0)
MCHC: 30.3 g/dL (ref 30.0–36.0)
MCV: 78.4 fL — ABNORMAL LOW (ref 80.0–100.0)
Platelets: 261 10*3/uL (ref 150–400)
RBC: 4.21 MIL/uL (ref 3.87–5.11)
RDW: 27.7 % — ABNORMAL HIGH (ref 11.5–15.5)
WBC: 7.5 10*3/uL (ref 4.0–10.5)
nRBC: 0 % (ref 0.0–0.2)

## 2020-05-07 ENCOUNTER — Telehealth: Payer: Self-pay

## 2020-05-07 NOTE — Telephone Encounter (Signed)
LMOM Confirmed patient appt for 05/09/20

## 2020-05-09 ENCOUNTER — Other Ambulatory Visit: Payer: Self-pay

## 2020-05-09 ENCOUNTER — Ambulatory Visit: Payer: BLUE CROSS/BLUE SHIELD | Admitting: Internal Medicine

## 2020-05-09 ENCOUNTER — Encounter: Payer: Self-pay | Admitting: Internal Medicine

## 2020-05-09 VITALS — BP 158/90 | HR 88 | Temp 96.8°F | Resp 16 | Ht 59.0 in | Wt 111.0 lb

## 2020-05-09 DIAGNOSIS — C3431 Malignant neoplasm of lower lobe, right bronchus or lung: Secondary | ICD-10-CM

## 2020-05-09 DIAGNOSIS — J9611 Chronic respiratory failure with hypoxia: Secondary | ICD-10-CM

## 2020-05-09 DIAGNOSIS — J449 Chronic obstructive pulmonary disease, unspecified: Secondary | ICD-10-CM

## 2020-05-09 DIAGNOSIS — Z9981 Dependence on supplemental oxygen: Secondary | ICD-10-CM

## 2020-05-09 DIAGNOSIS — J95811 Postprocedural pneumothorax: Secondary | ICD-10-CM

## 2020-05-09 NOTE — Progress Notes (Signed)
[facility] [place], [place] [address]  Pulmonary Sleep Medicine   Office Visit Note  Patient Name: Angela Jensen DOB: Oct 12, 1955 MRN [id number]  Date of Service: 05/09/2020  Complaints/HPI: Lung Cancer patient has a recent diagnosis of non-small cell lung cancer right lower lobe.  Patient was biopsies postoperatively she had a pneumothorax for which she was admitted to the hospital for assessment.  The patient received a chest tube subsequently she was discharged.  Now presents here for follow-up.  Clinically she is doing very well.  She has been seeing Dr. Mike Gip for the malignancy and she is supposed to be presented to the tumor board for further evaluation.  Patient has an FEV1 of 0.6 L which was the last time that it was measured.  She is obviously not going to be a surgical candidate so therefore radiation and chemo will likely be the best option for her.  The PFT that she had done was performed last year and I will get an updated study.  ROS  General: (-) fever, (-) chills, (-) night sweats, (-) weakness Skin: (-) rashes, (-) itching,. Eyes: (-) visual changes, (-) redness, (-) itching. Nose and Sinuses: (-) nasal stuffiness or itchiness, (-) postnasal drip, (-) nosebleeds, (-) sinus trouble. Mouth and Throat: (-) sore throat, (-) hoarseness. Neck: (-) swollen glands, (-) enlarged thyroid, (-) neck pain. Respiratory: + cough, (-) bloody sputum, + shortness of breath, - wheezing. Cardiovascular: - ankle swelling, (-) chest pain. Lymphatic: (-) lymph node enlargement. Neurologic: (-) numbness, (-) tingling. Psychiatric: (-) anxiety, (-) depression   Current Medication: Outpatient Encounter Medications as of 05/09/2020  Medication Sig   Accu-Chek FastClix Lancets MISC Use as directed twice a day diag E11.65   albuterol (PROVENTIL) (2.5 MG/3ML) 0.083% nebulizer solution Take 3 mLs (2.5 mg total) by nebulization every 6 (six) hours as needed for  wheezing.   amLODipine (NORVASC) 10 MG tablet Take 1 tablet (10 mg total) by mouth every morning.   aspirin EC 81 MG tablet Take 1 tablet (81 mg total) by mouth daily.   buPROPion (WELLBUTRIN XL) 150 MG 24 hr tablet TAKE 1 TABLET BY MOUTH TWICE DAILY FOR SMOKING CESSATION (Patient taking differently: Take 150 mg by mouth in the morning and at bedtime. )   clopidogrel (PLAVIX) 75 MG tablet Take 1 tablet by mouth once daily (Patient taking differently: Take 75 mg by mouth daily. )   ferrous sulfate 324 MG TBEC Take 324 mg by mouth.   gabapentin (NEURONTIN) 100 MG capsule Take 1 capsule by mouth twice daily (Patient taking differently: Take 100 mg by mouth 2 (two) times daily. )   glucose blood (ACCU-CHEK AVIVA PLUS) test strip Use as instructed to check blood sugars three times daily.  E11.65   JANUVIA 25 MG tablet Take 1 tablet by mouth once daily (Patient taking differently: Take 25 mg by mouth daily. )   lisinopril-hydrochlorothiazide (ZESTORETIC) 10-12.5 MG tablet Take 1 tablet by mouth every morning.   meloxicam (MOBIC) 7.5 MG tablet Take 1 tablet (7.5 mg total) by mouth 2 (two) times daily for 10 days.   metFORMIN (GLUCOPHAGE-XR) 500 MG 24 hr tablet Take 1 tablet (500 mg total) by mouth daily with breakfast.   mometasone-formoterol (DULERA) 200-5 MCG/ACT AERO Inhale 1 puff into the lungs 2 (two) times daily. Please fill as 90 day prescription   potassium chloride (KLOR-CON) 10 MEQ tablet TAKE 1 TABLET BY MOUTH EVERY OTHER DAY FOR  LOW  POTASSIUM (Patient  taking differently: Take 10 mEq by mouth every other day. )   rOPINIRole (REQUIP) 0.5 MG tablet Take 1 tablet (0.5 mg total) by mouth 2 (two) times daily.   rosuvastatin (CRESTOR) 10 MG tablet Take 1 tablet (10 mg total) by mouth at bedtime.   SPIRIVA HANDIHALER 18 MCG inhalation capsule INHALE THE CONTENTS OF ONE CAPSULE BY MOUTH ONCE DAILY *DO NOT SWALLOW CAPSULE*   VENTOLIN HFA 108 (90 Base) MCG/ACT inhaler INHALE 2 PUFFS  INTO LUNGS EVERY 4 HOURS AS NEEDED FOR WHEEZING OR SHORTNESS OF BREATH (Patient taking differently: Inhale 2 puffs into the lungs every 4 (four) hours as needed for wheezing or shortness of breath. )   No facility-administered encounter medications on file as of 05/09/2020.    Surgical History: Past Surgical History:  Procedure Laterality Date   CESAREAN SECTION     x3   COLONOSCOPY WITH PROPOFOL N/A 06/25/2015   Procedure: COLONOSCOPY WITH PROPOFOL;  Surgeon: Lucilla Lame, MD;  Location: [facility] END[facility]COPY;  Service: Endoscopy;  Laterality: N/A;   CYST REMOVAL LEG     and on shoulder    LOWER EXTREMITY ANGIOGRAPHY Left 09/29/2018   Procedure: LOWER EXTREMITY ANGIOGRAPHY;  Surgeon: Algernon Huxley, MD;  Location: [place] [facility];  Service: Cardiovascular;  Laterality: Left;   LUNG BIOPSY  05 15 2013   has lung "spots"   PERIPHERAL VASCULAR CATHETERIZATION Left 06/01/2016   Procedure: Lower Extremity Angiography;  Surgeon: Algernon Huxley, MD;  Location: [place] [facility];  Service: Cardiovascular;  Laterality: Left;   PERIPHERAL VASCULAR CATHETERIZATION N/A 06/01/2016   Procedure: Abdominal Aortogram w/Lower Extremity;  Surgeon: Algernon Huxley, MD;  Location: [place] [facility];  Service: Cardiovascular;  Laterality: N/A;   PERIPHERAL VASCULAR CATHETERIZATION  06/01/2016   Procedure: Lower Extremity Intervention;  Surgeon: Algernon Huxley, MD;  Location: [place] [facility];  Service: Cardiovascular;;   PERIPHERAL VASCULAR CATHETERIZATION Right 06/08/2016   Procedure: Lower Extremity Angiography;  Surgeon: Algernon Huxley, MD;  Location: [place] [facility];  Service: Cardiovascular;  Laterality: Right;   PERIPHERAL VASCULAR CATHETERIZATION  06/08/2016   Procedure: Lower Extremity Intervention;  Surgeon: Algernon Huxley, MD;  Location: [place] [facility];  Service: Cardiovascular;;    Medical History: Past Medical History:  Diagnosis Date   Arthritis    Asthma    Atherosclerosis of  native arteries of extremity with intermittent claudication ([place]) 05/26/2016   Cancer ([place]) 2012   Right Lung CA   COPD (chronic obstructive pulmonary disease) ([place])    Depression    Diabetes mellitus without complication ([place])    Patient takes Janumet   Essential hypertension 05/26/2016   Hypercholesteremia    Hypertension    Oxygen dependent    2L at nite    PAD (peripheral artery disease) ([place]) 06/22/2016   Peripheral vascular disease ([place])    Personal history of radiation therapy    Shortness of breath dyspnea    with exertion    Sleep apnea     Family History: Family History  Problem Relation Age of Onset   Lung cancer Father    Diabetes Mother    Hypercholesterolemia Mother    Diabetes Sister    Diabetes Maternal Grandmother    Diabetes Paternal Grandmother    Diabetes Sister    Heart attack Brother    Coronary artery disease Brother    Vascular Disease Brother    Hypertension Sister    Heart attack Brother  Social History: Social History   Socioeconomic History   Marital status: Widowed    Spouse name: Not on file   Number of children: Not on file   Years of education: Not on file   Highest education level: Not on file  Occupational History   Not on file  Tobacco Use   Smoking status: Former Smoker    Packs/day: 1.00    Years: 37.00    Pack years: 37.00    Types: Cigarettes    Quit date: 02/06/2010    Years since quitting: 10.2   Smokeless tobacco: Former Systems developer    Types: Snuff  Vaping Use   Vaping Use: Never used  Substance and Sexual Activity   Alcohol use: Yes    Alcohol/week: 5.0 standard drinks    Types: 5 Cans of beer per week    Comment: /h x of alcohol abuse -stopped 2012- now drinks 5 beer per w   Drug use: Not Currently    Types: Marijuana, "Crack" cocaine, Cocaine    Comment: hx of cocaine use- last use 2015; last use sat marijuana6/22/19,    Sexual activity: Yes  Other Topics Concern   Not on  file  Social History Narrative   Lives with Significant Other x 43 years   Social Determinants of Health   Financial Resource Strain:    Difficulty of Paying Living Expenses: Not on file  Food Insecurity:    Worried About Charity fundraiser in the Last Year: Not on file   [employer] of Food in the Last Year: Not on file  Transportation Needs:    Lack of Transportation (Medical): Not on file   Lack of Transportation (Non-Medical): Not on file  Physical Activity:    Days of Exercise per Week: Not on file   Minutes of Exercise per Session: Not on file  Stress:    Feeling of Stress : Not on file  Social Connections:    Frequency of Communication with Friends and Family: Not on file   Frequency of Social Gatherings with Friends and Family: Not on file   Attends Religious Services: Not on file   Active Member of Clubs or Organizations: Not on file   Attends Archivist Meetings: Not on file   Marital Status: Not on file  Intimate Partner Violence:    Fear of Current or Ex-Partner: Not on file   Emotionally Abused: Not on file   Physically Abused: Not on file   Sexually Abused: Not on file    Vital Signs: Blood pressure (!) 158/90, pulse 88, temperature (!) 96.8 F (36 C), resp. rate 16, height _0  (1.499 m), weight 111 lb (50.3 kg), SpO2 96 %.  Examination: General Appearance: The patient is well-developed, well-nourished, and in no distress. Skin: Gross inspection of skin unremarkable. Head: normocephalic, no gross deformities. Eyes: no gross deformities noted. ENT: ears appear grossly normal no exudates. Neck: Supple. No thyromegaly. No LAD. Respiratory: no rhonchi noted. Cardiovascular: Normal S1 and S2 without murmur or rub. Extremities: No cyanosis. pulses are equal. Neurologic: Alert and oriented. No involuntary movements.  LABS: Recent Results (from the past 2160 hour(s))  Glucose, capillary     Status: None   Collection Time: 03/19/20  12:09 PM  Result Value Ref Range   Glucose-Capillary 94 70 - 99 mg/dL    Comment: Glucose reference range applies only to samples taken after fasting for at least 8 hours.  Comprehensive metabolic panel     Status: Abnormal  Collection Time: 03/28/20 10:37 AM  Result Value Ref Range   Glucose 140 (H) 65 - 99 mg/dL   BUN 10 8 - 27 mg/dL   Creatinine, Ser 0.76 0.57 - 1.00 mg/dL   GFR calc non Af Amer 83 >59 mL/min/1.73   GFR calc Af Amer 96 >59 mL/min/1.73    Comment: **[facility] currently reports eGFR in compliance with the current**   recommendations of the [employer]. [facility] will   update reporting as new guidelines are published from the NKF-ASN   Task force.    BUN/Creatinine Ratio 13 12 - 28   Sodium 137 134 - 144 mmol/L   Potassium 4.1 3.5 - 5.2 mmol/L   Chloride 101 96 - 106 mmol/L   CO2 23 20 - 29 mmol/L   Calcium 9.4 8.7 - 10.3 mg/dL   Total Protein 7.6 6.0 - 8.5 g/dL   Albumin 4.7 3.8 - 4.8 g/dL   Globulin, Total 2.9 1.5 - 4.5 g/dL   Albumin/Globulin Ratio 1.6 1.2 - 2.2   Bilirubin Total 0.2 0.0 - 1.2 mg/dL   Alkaline Phosphatase 81 48 - 121 IU/L   AST 31 0 - 40 IU/L   ALT 24 0 - 32 IU/L  CBC     Status: Abnormal   Collection Time: 03/28/20 10:37 AM  Result Value Ref Range   WBC 6.0 3.4 - 10.8 x10E3/uL   RBC 3.87 3.77 - 5.28 x10E6/uL   Hemoglobin 7.1 (L) 11.1 - 15.9 g/dL   Hematocrit 27.1 (L) 34.0 - 46.6 %   MCV 70 (L) 79 - 97 fL   MCH 18.3 (L) 26.6 - 33.0 pg   MCHC 26.2 (L) 31 - 35 g/dL   RDW 18.6 (H) 11.7 - 15.4 %   Platelets 442 150 - 450 x10E3/uL  Lipid Panel With LDL/HDL Ratio     Status: None   Collection Time: 03/28/20 10:37 AM  Result Value Ref Range   Cholesterol, Total 160 100 - 199 mg/dL   Triglycerides 68 0 - 149 mg/dL   HDL 66 >39 mg/dL   VLDL Cholesterol Cal 13 5 - 40 mg/dL   LDL Chol Calc (NIH) 81 0 - 99 mg/dL   LDL/HDL Ratio 1.2 0.0 - 3.2 ratio    Comment:                                     LDL/HDL Ratio                                              Men  Women                               1/2 Avg.Risk  1.0    1.5                                   Avg.Risk  3.6    3.2                                2X Avg.Risk  6.2    5.0  3X Avg.Risk  8.0    6.1   T4, free     Status: None   Collection Time: 03/28/20 10:37 AM  Result Value Ref Range   Free T4 1.33 0.82 - 1.77 ng/dL  TSH     Status: Abnormal   Collection Time: 03/28/20 10:37 AM  Result Value Ref Range   TSH 0.379 (L) 0.450 - 4.500 uIU/mL  VITAMIN D 25 Hydroxy (Vit-D Deficiency, Fractures)     Status: Abnormal   Collection Time: 03/28/20 10:37 AM  Result Value Ref Range   Vit D, 25-Hydroxy 22.0 (L) 30.0 - 100.0 ng/mL    Comment: Vitamin D deficiency has been defined by the [place] and an Endocrine Society practice guideline as a level of serum 25-OH vitamin D less than 20 ng/mL (1,2). The Endocrine Society went on to further define vitamin D insufficiency as a level between 21 and 29 ng/mL (2). 1. IOM (Institute of Medicine). 2010. Dietary reference    intakes for calcium and D. [place]: The    [employer]. 2. Holick MF, Binkley [place], Bischoff-Ferrari HA, et al.    Evaluation, treatment, and prevention of vitamin D    deficiency: an Endocrine Society clinical practice    guideline. JCEM. 2011 Jul; 96(7):1911-30.   Hepatitis C antibody     Status: None   Collection Time: 03/28/20 10:37 AM  Result Value Ref Range   Hep C Virus Ab <0.1 0.0 - 0.9 s/co ratio    Comment:                                   Negative:     < 0.8                              Indeterminate: 0.8 - 0.9                                   Positive:     > 0.9  The CDC recommends that a positive HCV antibody result  be followed up with a HCV Nucleic Acid Amplification  test ([id number]).   POCT HgB A1C     Status: Abnormal   Collection Time: 04/02/20 10:43 AM  Result Value Ref Range   Hemoglobin A1C 5.7 (A) 4.0 - 5.6 %    HbA1c POC (<> result, manual entry)     HbA1c, POC (prediabetic range)     HbA1c, POC (controlled diabetic range)    SARS CORONAVIRUS 2 (TAT 6-24 HRS) Nasopharyngeal Nasopharyngeal Swab     Status: None   Collection Time: 04/03/20  1:59 PM   Specimen: Nasopharyngeal Swab  Result Value Ref Range   SARS Coronavirus 2 NEGATIVE NEGATIVE    Comment: (NOTE) SARS-CoV-2 target nucleic acids are NOT DETECTED.  The SARS-CoV-2 RNA is generally detectable in upper and lower respiratory specimens during the acute phase of infection. Negative results do not preclude SARS-CoV-2 infection, do not rule out co-infections with other pathogens, and should not be used as the sole basis for treatment or other patient management decisions. Negative results must be combined with clinical observations, patient history, and epidemiological information. The expected result is Negative.  Fact Sheet for Patients: SugarRoll.be  Fact Sheet for Healthcare Providers: https://www.woods-mathews.com/  This test is not yet approved or  cleared by the [place] and  has been authorized for detection and/or diagnosis of SARS-CoV-2 by FDA under an Emergency Use Authorization (EUA). This EUA will remain  in effect (meaning this test can be used) for the duration of the COVID-19 declaration under Se ction 564(b)(1) of the Act, 21 U.S.C. section 360bbb-3(b)(1), unless the authorization is terminated or revoked sooner.  Performed at [facility], [place] [address] [place], [place] [address]   CBC upon arrival     Status: Abnormal   Collection Time: 04/04/20 10:03 AM  Result Value Ref Range   WBC 4.5 4.0 - 10.5 K/uL   RBC 3.47 (L) 3.87 - 5.11 MIL/uL   Hemoglobin 6.6 (L) 12.0 - 15.0 g/dL    Comment: Reticulocyte Hemoglobin testing may be clinically indicated, consider ordering this additional test [id number]    HCT 23.6 (L) 36 - 46 %   MCV 68.0 (L) 80.0 - 100.0 fL    MCH 19.0 (L) 26.0 - 34.0 pg   MCHC 28.0 (L) 30.0 - 36.0 g/dL   RDW 20.3 (H) 11.5 - 15.5 %   Platelets 345 150 - 400 K/uL   nRBC 0.0 0.0 - 0.2 %    Comment: Performed at [facility], [address] [place], [place] [address]  Protime-INR upon arrival     Status: None   Collection Time: 04/04/20 10:03 AM  Result Value Ref Range   Prothrombin Time 12.1 11.4 - 15.2 seconds   INR 0.9 0.8 - 1.2    Comment: (NOTE) INR goal varies based on device and disease states. Performed at [facility], [address] [place], [place] [address]   Glucose, capillary     Status: None   Collection Time: 04/04/20 11:48 AM  Result Value Ref Range   Glucose-Capillary 87 70 - 99 mg/dL    Comment: Glucose reference range applies only to samples taken after fasting for at least 8 hours.  Type and screen [place]     Status: None   Collection Time: 04/04/20  4:18 PM  Result Value Ref Range   ABO/RH(D) O POS    Antibody Screen NEG    Sample Expiration 04/07/2020,2359    Unit Number [id number]    Blood Component Type RED CELLS,LR    Unit division 00    Status of Unit ISSUED,FINAL    Transfusion Status OK TO TRANSFUSE    Crossmatch Result      Compatible Performed at [facility], [address] [place], [place] [address]    Unit Number [id number]    Blood Component Type RED CELLS,LR    Unit division 00    Status of Unit ISSUED,FINAL    Transfusion Status OK TO TRANSFUSE    Crossmatch Result Compatible   CBC     Status: Abnormal   Collection Time: 04/04/20  4:18 PM  Result Value Ref Range   WBC 5.1 4.0 - 10.5 K/uL   RBC 3.43 (L) 3.87 - 5.11 MIL/uL   Hemoglobin 6.5 (L) 12.0 - 15.0 g/dL    Comment: Reticulocyte Hemoglobin testing may be clinically indicated, consider ordering this additional test [id number]    HCT 23.3 (L) 36 - 46 %   MCV 67.9 (L) 80.0 - 100.0 fL   MCH 19.0 (L) 26.0 - 34.0 pg   MCHC 27.9 (L) 30.0 - 36.0 g/dL    RDW 20.3 (H) 11.5 - 15.5 %   Platelets 314 150 - 400 K/uL  nRBC 0.0 0.0 - 0.2 %    Comment: Performed at [facility], [address] [place], [place] [address]  BPAM RBC     Status: None   Collection Time: 04/04/20  4:18 PM  Result Value Ref Range   ISSUE DATE / TIME 254270623762    Blood Product Unit Number [id number]    PRODUCT CODE [id number]    Unit Type and Rh 5100    Blood Product Expiration Date 546270350093    ISSUE DATE / TIME 818299371696    Blood Product Unit Number [id number]    PRODUCT CODE [id number]    Unit Type and Rh 5100    Blood Product Expiration Date 423536144315   Prepare RBC (crossmatch)     Status: None   Collection Time: 04/04/20  4:30 PM  Result Value Ref Range   Order Confirmation      ORDER PROCESSED BY BLOOD BANK Performed at [facility], [address] [place], [place] [address]   Hemoglobin and hematocrit, blood     Status: Abnormal   Collection Time: 04/04/20 10:04 PM  Result Value Ref Range   Hemoglobin 9.2 (L) 12.0 - 15.0 g/dL    Comment: REPEATED TO VERIFY POST TRANSFUSION SPECIMEN    HCT 31.3 (L) 36 - 46 %    Comment: Performed at [facility], [address] [place] , [place] [address]  Prepare RBC (crossmatch)     Status: None   Collection Time: 04/04/20 11:17 PM  Result Value Ref Range   Order Confirmation      ORDER PROCESSED BY BLOOD BANK Performed at [facility], [address] [place], [place] [address]   Basic metabolic panel     Status: Abnormal   Collection Time: 04/11/20  9:23 AM  Result Value Ref Range   Sodium 136 135 - 145 mmol/L   Potassium 3.5 3.5 - 5.1 mmol/L   Chloride 100 98 - 111 mmol/L   CO2 27 22 - 32 mmol/L   Glucose, Bld 105 (H) 70 - 99 mg/dL    Comment: Glucose reference range applies only to samples taken after fasting for at least 8 hours.   BUN 14 8 - 23 mg/dL   Creatinine, Ser 0.64 0.44 - 1.00 mg/dL   Calcium 9.1 8.9 - 10.3 mg/dL   GFR calc non Af Amer  >60 >60 mL/min   GFR calc Af Amer >60 >60 mL/min   Anion gap 9 5 - 15    Comment: Performed at [facility], [address] [place], [place] [address]  Fibrin derivatives D-Dimer [facility])     Status: Abnormal   Collection Time: 04/11/20  9:23 AM  Result Value Ref Range   Fibrin derivatives D-dimer ([facility]) 1,043.59 (H) 0.00 - 499.00 ng/mL (FEU)    Comment: (NOTE) <> Exclusion of Venous Thromboembolism (VTE) - OUTPATIENT ONLY   (Emergency Department or [place])    0-499 ng/ml (FEU): With a low to intermediate pretest probability                      for VTE this test result excludes the diagnosis                      of VTE.   >499 ng/ml (FEU) : VTE not excluded; additional work up for VTE is                      required.  <>  Testing on Inpatients and Evaluation of Disseminated Intravascular   Coagulation (DIC) Reference Range:   0-499 ng/ml (FEU) Performed at [facility], [address] [place], [place] [address]   Iron and TIBC     Status: Abnormal   Collection Time: 04/11/20  9:23 AM  Result Value Ref Range   Iron 127 28 - 170 ug/dL   TIBC 577 (H) 250 - 450 ug/dL   Saturation Ratios 22 10.4 - 31.8 %   UIBC 450 ug/dL    Comment: Performed at [facility], [address] [place], [place] [address]  Ferritin     Status: Abnormal   Collection Time: 04/11/20  9:23 AM  Result Value Ref Range   Ferritin 10 (L) 11 - 307 ng/mL    Comment: Performed at [facility], [address] [place], [place] [address]  Reticulocytes     Status: Abnormal   Collection Time: 04/11/20  9:23 AM  Result Value Ref Range   Retic Ct Pct 0.4 0.4 - 3.1 %   RBC. 4.98 3.87 - 5.11 MIL/uL   Retic Count, Absolute Not Measured 19.0 - 186.0 K/uL   Immature Retic Fract 16.2 (H) 2.3 - 15.9 %    Comment: Performed at [facility], [address] [place], [place] [address]  CBC with Differential/Platelet     Status: Abnormal   Collection Time: 04/11/20   9:23 AM  Result Value Ref Range   WBC 5.4 4.0 - 10.5 K/uL   RBC 5.10 3.87 - 5.11 MIL/uL   Hemoglobin 11.4 (L) 12.0 - 15.0 g/dL   HCT 38.1 36 - 46 %   MCV 74.7 (L) 80.0 - 100.0 fL   MCH 22.4 (L) 26.0 - 34.0 pg   MCHC 29.9 (L) 30.0 - 36.0 g/dL   RDW 23.9 (H) 11.5 - 15.5 %   Platelets 139 (L) 150 - 400 K/uL   nRBC 0.0 0.0 - 0.2 %   Neutrophils Relative % 70 %   Neutro Abs 3.8 1.7 - 7.7 K/uL   Lymphocytes Relative 17 %   Lymphs Abs 0.9 0.7 - 4.0 K/uL   Monocytes Relative 10 %   Monocytes Absolute 0.5 0 - 1 K/uL   Eosinophils Relative 2 %   Eosinophils Absolute 0.1 0 - 0 K/uL   Basophils Relative 1 %   Basophils Absolute 0.1 0 - 0 K/uL   Immature Granulocytes 0 %   Abs Immature Granulocytes 0.02 0.00 - 0.07 K/uL    Comment: Performed at [facility], [address], [place], [place] [address]  CBC with Differential/Platelet     Status: Abnormal   Collection Time: 04/17/20  8:34 AM  Result Value Ref Range   WBC 8.1 4.0 - 10.5 K/uL   RBC 5.10 3.87 - 5.11 MIL/uL   Hemoglobin 11.4 (L) 12.0 - 15.0 g/dL   HCT 37.6 36 - 46 %   MCV 73.7 (L) 80.0 - 100.0 fL   MCH 22.4 (L) 26.0 - 34.0 pg   MCHC 30.3 30.0 - 36.0 g/dL   RDW 25.2 (H) 11.5 - 15.5 %   Platelets 172 150 - 400 K/uL   nRBC 0.0 0.0 - 0.2 %   Neutrophils Relative % 72 %   Neutro Abs 5.9 1.7 - 7.7 K/uL   Lymphocytes Relative 16 %   Lymphs Abs 1.3 0.7 - 4.0 K/uL   Monocytes Relative 9 %   Monocytes Absolute 0.7 0 - 1 K/uL  Eosinophils Relative 2 %   Eosinophils Absolute 0.2 0 - 0 K/uL   Basophils Relative 1 %   Basophils Absolute 0.1 0 - 0 K/uL   Immature Granulocytes 0 %   Abs Immature Granulocytes 0.03 0.00 - 0.07 K/uL    Comment: Performed at [facility], [address], [place], [place] [address]  Sample to Blood Bank     Status: None   Collection Time: 04/17/20  8:37 AM  Result Value Ref Range   Blood Bank Specimen SAMPLE AVAILABLE FOR TESTING    Sample Expiration       04/20/2020,2359 Performed at [facility], [place], [place] [address]   SARS CORONAVIRUS 2 (TAT 6-24 HRS) Nasopharyngeal Nasopharyngeal Swab     Status: None   Collection Time: 04/23/20 11:56 AM   Specimen: Nasopharyngeal Swab  Result Value Ref Range   SARS Coronavirus 2 NEGATIVE NEGATIVE    Comment: (NOTE) SARS-CoV-2 target nucleic acids are NOT DETECTED.  The SARS-CoV-2 RNA is generally detectable in upper and lower respiratory specimens during the acute phase of infection. Negative results do not preclude SARS-CoV-2 infection, do not rule out co-infections with other pathogens, and should not be used as the sole basis for treatment or other patient management decisions. Negative results must be combined with clinical observations, patient history, and epidemiological information. The expected result is Negative.  Fact Sheet for Patients: SugarRoll.be  Fact Sheet for Healthcare Providers: https://www.woods-mathews.com/  This test is not yet approved or cleared by the [place] FDA and  has been authorized for detection and/or diagnosis of SARS-CoV-2 by FDA under an Emergency Use Authorization (EUA). This EUA will remain  in effect (meaning this test can be used) for the duration of the COVID-19 declaration under Se ction 564(b)(1) of the Act, 21 U.S.C. section 360bbb-3(b)(1), unless the authorization is terminated or revoked sooner.  Performed at [facility], [place] [address] [place], [place] [address]   Glucose, capillary     Status: Abnormal   Collection Time: 04/24/20 10:36 AM  Result Value Ref Range   Glucose-Capillary 112 (H) 70 - 99 mg/dL    Comment: Glucose reference range applies only to samples taken after fasting for at least 8 hours.  CBC     Status: Abnormal   Collection Time: 04/24/20 10:43 AM  Result Value Ref Range   WBC 6.4 4.0 - 10.5 K/uL   RBC 4.28 3.87 - 5.11 MIL/uL    Hemoglobin 10.0 (L) 12.0 - 15.0 g/dL   HCT 31.8 (L) 36 - 46 %   MCV 74.3 (L) 80.0 - 100.0 fL   MCH 23.4 (L) 26.0 - 34.0 pg   MCHC 31.4 30.0 - 36.0 g/dL   RDW 27.2 (H) 11.5 - 15.5 %   Platelets 674 (H) 150 - 400 K/uL   nRBC 0.0 0.0 - 0.2 %    Comment: Performed at [facility], [address] [place], [place] [address]  Protime-INR     Status: None   Collection Time: 04/24/20 10:43 AM  Result Value Ref Range   Prothrombin Time 12.3 11.4 - 15.2 seconds   INR 1.0 0.8 - 1.2    Comment: (NOTE) INR goal varies based on device and disease states. Performed at [facility], [address], [place], [place] [address]   Surgical pathology     Status: None   Collection Time: 04/24/20  1:39 PM  Result Value Ref Range   SURGICAL PATHOLOGY  SURGICAL PATHOLOGY CASE: [id number] PATIENT: [facility] Surgical Pathology Report     Specimen Submitted: A. Lung, right lower lobe; bx  Clinical History: Enlarging right lower lobe lung nodules. 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     DIAGNOSIS: A. LUNG, RIGHT LOWER LOBE; CT-GUIDED CORE BIOPSY: - ADENOCARCINOMA OF THE LUNG, PAPILLARY AND SOLID PATTERNS.  Comment: The current sample shows adenocarcinoma histologically very similar to the previous RLL biopsy ([id number], 02/09/2018, slides reviewed). No lepidic pattern is seen in the current sample, unlike the previous RLL specimen. There are areas of clear cell change, a finding also noted in previous biopsies. Previously performed IHC shows that the tumor cells are positive for TTF1 and Napsin A, and negative for PAX8, a profile which supports lung origin.  There is probably adequate tissue for ancillary testing.  GROSS DESCRI PTION: A. Labeled: Received labeled with the patient's name and date of birth (per requisition lung, right lower lobe) Received: Formalin Tissue fragment(s):  3 Size: Aggregate, 0.7 x 0.2 x 0.1 cm Description: Received are fragmented cores of red-brown soft tissue. Entirely submitted in 1 cassette.      Final Diagnosis performed by Bryan Lemma, MD.   Electronically signed 04/25/2020 3:55:23PM The electronic signature indicates that the named Attending Pathologist has evaluated the specimen Technical component performed at [facility], [address] [place], [place] [address] Lab: [phone] Dir: Rush Farmer, MD, MMM  Professional component performed at [facility], [facility], [place], [place], [place] [address] Lab: [phone] Dir: Dellia Nims. Rubinas, MD   Glucose, capillary     Status: Abnormal   Collection Time: 04/24/20  1:49 PM  Result Value Ref Range   Glucose-Capillary 111 (H) 70 - 99 mg/dL    Comment: Glucose reference range applies only to samples taken after fasting for at least 8 hours.  CBC     Status: Abnormal   Collection Time: 04/29/20  1:54 PM  Result Value Ref Range   WBC 10.6 (H) 4.0 - 10.5 K/uL   RBC 4.78 3.87 - 5.11 MIL/uL   Hemoglobin 10.9 (L) 12.0 - 15.0 g/dL   HCT 36.2 36 - 46 %   MCV 75.7 (L) 80.0 - 100.0 fL   MCH 22.8 (L) 26.0 - 34.0 pg   MCHC 30.1 30.0 - 36.0 g/dL   RDW 27.4 (H) 11.5 - 15.5 %   Platelets 445 (H) 150 - 400 K/uL   nRBC 0.0 0.0 - 0.2 %    Comment: Performed at [facility], [address], [place], [place] [address]  CBC with Differential     Status: Abnormal   Collection Time: 05/01/20  9:49 AM  Result Value Ref Range   WBC 10.0 4.0 - 10.5 K/uL   RBC 4.68 3.87 - 5.11 MIL/uL   Hemoglobin 10.5 (L) 12.0 - 15.0 g/dL   HCT 35.7 (L) 36 - 46 %   MCV 76.3 (L) 80.0 - 100.0 fL   MCH 22.4 (L) 26.0 - 34.0 pg   MCHC 29.4 (L) 30.0 - 36.0 g/dL   RDW 28.1 (H) 11.5 - 15.5 %   Platelets 342 150 - 400 K/uL   nRBC 0.0 0.0 - 0.2 %   Neutrophils Relative % 78 %   Neutro Abs 7.8 (H) 1.7 - 7.7 K/uL   Lymphocytes Relative 11 %   Lymphs Abs 1.1 0.7 - 4.0 K/uL   Monocytes  Relative 8 %  Monocytes Absolute 0.8 0 - 1 K/uL   Eosinophils Relative 2 %   Eosinophils Absolute 0.2 0 - 0 K/uL   Basophils Relative 1 %   Basophils Absolute 0.1 0 - 0 K/uL   WBC Morphology MORPHOLOGY UNREMARKABLE    RBC Morphology MORPHOLOGY UNREMARKABLE    Smear Review Normal platelet morphology    Immature Granulocytes 0 %   Abs Immature Granulocytes 0.04 0.00 - 0.07 K/uL    Comment: Performed at [facility], [address] [place], [place] [address]  Comprehensive metabolic panel     Status: Abnormal   Collection Time: 05/01/20  9:49 AM  Result Value Ref Range   Sodium 137 135 - 145 mmol/L   Potassium 4.0 3.5 - 5.1 mmol/L   Chloride 100 98 - 111 mmol/L   CO2 28 22 - 32 mmol/L   Glucose, Bld 148 (H) 70 - 99 mg/dL    Comment: Glucose reference range applies only to samples taken after fasting for at least 8 hours.   BUN 11 8 - 23 mg/dL   Creatinine, Ser 0.50 0.44 - 1.00 mg/dL   Calcium 9.0 8.9 - 10.3 mg/dL   Total Protein 7.6 6.5 - 8.1 g/dL   Albumin 4.1 3.5 - 5.0 g/dL   AST 26 15 - 41 U/L   ALT 32 0 - 44 U/L   Alkaline Phosphatase 72 38 - 126 U/L   Total Bilirubin 0.7 0.3 - 1.2 mg/dL   GFR calc non Af Amer >60 >60 mL/min   GFR calc Af Amer >60 >60 mL/min   Anion gap 9 5 - 15    Comment: Performed at [facility], [place], [place] [address]  Troponin I (High Sensitivity)     Status: None   Collection Time: 05/01/20  9:49 AM  Result Value Ref Range   Troponin I (High Sensitivity) 7 <18 ng/L    Comment: (NOTE) Elevated high sensitivity troponin I (hsTnI) values and significant  changes across serial measurements may suggest ACS but many other  chronic and acute conditions are known to elevate hsTnI results.  Refer to the "Links" section for chest pain algorithms and additional  guidance. Performed at [facility], [place], [place] [address]   Troponin I (High Sensitivity)     Status: None   Collection  Time: 05/01/20  1:18 PM  Result Value Ref Range   Troponin I (High Sensitivity) 8 <18 ng/L    Comment: (NOTE) Elevated high sensitivity troponin I (hsTnI) values and significant  changes across serial measurements may suggest ACS but many other  chronic and acute conditions are known to elevate hsTnI results.  Refer to the "Links" section for chest pain algorithms and additional  guidance. Performed at [facility], [address] [place], [place] [address]   SARS Coronavirus 2 by RT PCR (hospital order, performed in [facility] hospital lab) Nasopharyngeal Nasopharyngeal Swab     Status: None   Collection Time: 05/01/20  1:20 PM   Specimen: Nasopharyngeal Swab  Result Value Ref Range   SARS Coronavirus 2 NEGATIVE NEGATIVE    Comment: (NOTE) SARS-CoV-2 target nucleic acids are NOT DETECTED.  The SARS-CoV-2 RNA is generally detectable in upper and lower respiratory specimens during the acute phase of infection. The lowest concentration of SARS-CoV-2 viral copies this assay can detect is 250 copies / mL. A negative result does not preclude SARS-CoV-2 infection and should not be used as the sole basis  for treatment or other patient management decisions.  A negative result may occur with improper specimen collection / handling, submission of specimen other than nasopharyngeal swab, presence of viral mutation(s) within the areas targeted by this assay, and inadequate number of viral copies (<250 copies / mL). A negative result must be combined with clinical observations, patient history, and epidemiological information.  Fact Sheet for Patients:   StrictlyIdeas.no  Fact Sheet for Healthcare Providers: BankingDealers.co.za  This test is not yet approved or  cleared by the [place] FDA and has been authorized for detection and/or diagnosis of SARS-CoV-2 by FDA under an Emergency Use Authorization (EUA).  This EUA will  remain in effect (meaning this test can be used) for the duration of the COVID-19 declaration under Section 564(b)(1) of the Act, 21 U.S.C. section 360bbb-3(b)(1), unless the authorization is terminated or revoked sooner.  Performed at [facility], [address] [place], [place] [address]   Glucose, capillary     Status: Abnormal   Collection Time: 05/01/20  2:48 PM  Result Value Ref Range   Glucose-Capillary 183 (H) 70 - 99 mg/dL    Comment: Glucose reference range applies only to samples taken after fasting for at least 8 hours.  Glucose, capillary     Status: Abnormal   Collection Time: 05/01/20  4:57 PM  Result Value Ref Range   Glucose-Capillary 146 (H) 70 - 99 mg/dL    Comment: Glucose reference range applies only to samples taken after fasting for at least 8 hours.  HIV Antibody (routine testing w rflx)     Status: None   Collection Time: 05/01/20  5:05 PM  Result Value Ref Range   HIV Screen 4th Generation wRfx Non Reactive Non Reactive    Comment: Performed at [facility], [place] [address] [place], [place] [address]  Hemoglobin A1c     Status: Abnormal   Collection Time: 05/01/20  5:05 PM  Result Value Ref Range   Hgb A1c MFr Bld 6.1 (H) 4.8 - 5.6 %    Comment: (NOTE) Pre diabetes:          5.7%-6.4%  Diabetes:              >6.4%  Glycemic control for   <7.0% adults with diabetes    Mean Plasma Glucose 128.37 mg/dL    Comment: Performed at [place] [address], [place], [place] [address]  Glucose, capillary     Status: Abnormal   Collection Time: 05/01/20 10:18 PM  Result Value Ref Range   Glucose-Capillary 103 (H) 70 - 99 mg/dL    Comment: Glucose reference range applies only to samples taken after fasting for at least 8 hours.  Basic metabolic panel     Status: Abnormal   Collection Time: 05/02/20  6:07 AM  Result Value Ref Range   Sodium 134 (L) 135 - 145 mmol/L   Potassium 4.1 3.5 - 5.1 mmol/L   Chloride 98 98 - 111 mmol/L    CO2 29 22 - 32 mmol/L   Glucose, Bld 123 (H) 70 - 99 mg/dL    Comment: Glucose reference range applies only to samples taken after fasting for at least 8 hours.   BUN 15 8 - 23 mg/dL   Creatinine, Ser 0.59 0.44 - 1.00 mg/dL   Calcium 9.0 8.9 - 10.3 mg/dL   GFR calc non Af Amer >60 >60 mL/min   GFR calc Af Amer >60 >60 mL/min   Anion  gap 7 5 - 15    Comment: Performed at [facility], [address] [place], [place] [address]  CBC     Status: Abnormal   Collection Time: 05/02/20  6:07 AM  Result Value Ref Range   WBC 8.2 4.0 - 10.5 K/uL   RBC 4.42 3.87 - 5.11 MIL/uL   Hemoglobin 10.1 (L) 12.0 - 15.0 g/dL   HCT 34.5 (L) 36 - 46 %   MCV 78.1 (L) 80.0 - 100.0 fL   MCH 22.9 (L) 26.0 - 34.0 pg   MCHC 29.3 (L) 30.0 - 36.0 g/dL   RDW Not Measured 11.5 - 15.5 %   Platelets 314 150 - 400 K/uL   nRBC 0.0 0.0 - 0.2 %    Comment: Performed at [facility], [address] [place], [place] [address]  Glucose, capillary     Status: Abnormal   Collection Time: 05/02/20  8:19 AM  Result Value Ref Range   Glucose-Capillary 124 (H) 70 - 99 mg/dL    Comment: Glucose reference range applies only to samples taken after fasting for at least 8 hours.  Glucose, capillary     Status: Abnormal   Collection Time: 05/02/20 12:28 PM  Result Value Ref Range   Glucose-Capillary 132 (H) 70 - 99 mg/dL    Comment: Glucose reference range applies only to samples taken after fasting for at least 8 hours.  Glucose, capillary     Status: Abnormal   Collection Time: 05/02/20  5:19 PM  Result Value Ref Range   Glucose-Capillary 114 (H) 70 - 99 mg/dL    Comment: Glucose reference range applies only to samples taken after fasting for at least 8 hours.  Glucose, capillary     Status: Abnormal   Collection Time: 05/02/20  9:15 PM  Result Value Ref Range   Glucose-Capillary 129 (H) 70 - 99 mg/dL    Comment: Glucose reference range applies only to samples taken after fasting for at least 8 hours.   Glucose, capillary     Status: Abnormal   Collection Time: 05/03/20  7:27 AM  Result Value Ref Range   Glucose-Capillary 110 (H) 70 - 99 mg/dL    Comment: Glucose reference range applies only to samples taken after fasting for at least 8 hours.  Glucose, capillary     Status: Abnormal   Collection Time: 05/03/20 11:53 AM  Result Value Ref Range   Glucose-Capillary 200 (H) 70 - 99 mg/dL    Comment: Glucose reference range applies only to samples taken after fasting for at least 8 hours.  Glucose, capillary     Status: Abnormal   Collection Time: 05/03/20  4:49 PM  Result Value Ref Range   Glucose-Capillary 123 (H) 70 - 99 mg/dL    Comment: Glucose reference range applies only to samples taken after fasting for at least 8 hours.  Glucose, capillary     Status: Abnormal   Collection Time: 05/03/20  9:13 PM  Result Value Ref Range   Glucose-Capillary 103 (H) 70 - 99 mg/dL    Comment: Glucose reference range applies only to samples taken after fasting for at least 8 hours.  CBC     Status: Abnormal   Collection Time: 05/04/20  5:59 AM  Result Value Ref Range   WBC 6.8 4.0 - 10.5 K/uL   RBC 3.68 (L) 3.87 - 5.11 MIL/uL   Hemoglobin 8.5 (L) 12.0 - 15.0 g/dL   HCT 29.1 (L) 36 - 46 %  MCV 79.1 (L) 80.0 - 100.0 fL   MCH 23.1 (L) 26.0 - 34.0 pg   MCHC 29.2 (L) 30.0 - 36.0 g/dL   RDW 27.3 (H) 11.5 - 15.5 %   Platelets 266 150 - 400 K/uL   nRBC 0.0 0.0 - 0.2 %    Comment: Performed at [facility], [address], [place], [place] [address]  Basic metabolic panel     Status: Abnormal   Collection Time: 05/04/20  5:59 AM  Result Value Ref Range   Sodium 140 135 - 145 mmol/L   Potassium 3.7 3.5 - 5.1 mmol/L   Chloride 107 98 - 111 mmol/L   CO2 28 22 - 32 mmol/L   Glucose, Bld 100 (H) 70 - 99 mg/dL    Comment: Glucose reference range applies only to samples taken after fasting for at least 8 hours.   BUN 11 8 - 23 mg/dL   Creatinine, Ser 0.53 0.44 - 1.00 mg/dL   Calcium 8.3  (L) 8.9 - 10.3 mg/dL   GFR calc non Af Amer >60 >60 mL/min   GFR calc Af Amer >60 >60 mL/min   Anion gap 5 5 - 15    Comment: Performed at [facility], [address] [place], [place] [address]  Glucose, capillary     Status: Abnormal   Collection Time: 05/04/20  7:55 AM  Result Value Ref Range   Glucose-Capillary 102 (H) 70 - 99 mg/dL    Comment: Glucose reference range applies only to samples taken after fasting for at least 8 hours.  Glucose, capillary     Status: Abnormal   Collection Time: 05/04/20 12:10 PM  Result Value Ref Range   Glucose-Capillary 129 (H) 70 - 99 mg/dL    Comment: Glucose reference range applies only to samples taken after fasting for at least 8 hours.  Glucose, capillary     Status: Abnormal   Collection Time: 05/04/20  4:37 PM  Result Value Ref Range   Glucose-Capillary 112 (H) 70 - 99 mg/dL    Comment: Glucose reference range applies only to samples taken after fasting for at least 8 hours.  Glucose, capillary     Status: Abnormal   Collection Time: 05/04/20  9:01 PM  Result Value Ref Range   Glucose-Capillary 106 (H) 70 - 99 mg/dL    Comment: Glucose reference range applies only to samples taken after fasting for at least 8 hours.  CBC     Status: Abnormal   Collection Time: 05/05/20  5:58 AM  Result Value Ref Range   WBC 6.7 4.0 - 10.5 K/uL   RBC 3.79 (L) 3.87 - 5.11 MIL/uL   Hemoglobin 8.8 (L) 12.0 - 15.0 g/dL   HCT 30.0 (L) 36 - 46 %   MCV 79.2 (L) 80.0 - 100.0 fL   MCH 23.2 (L) 26.0 - 34.0 pg   MCHC 29.3 (L) 30.0 - 36.0 g/dL   RDW 27.3 (H) 11.5 - 15.5 %   Platelets 244 150 - 400 K/uL   nRBC 0.0 0.0 - 0.2 %    Comment: Performed at [facility], [address], [place], [place] [address]  Basic metabolic panel     Status: Abnormal   Collection Time: 05/05/20  5:58 AM  Result Value Ref Range   Sodium 141 135 - 145 mmol/L   Potassium 3.7 3.5 - 5.1 mmol/L   Chloride 105 98 - 111 mmol/L   CO2 30 22 - 32 mmol/L  Glucose,  Bld 89 70 - 99 mg/dL    Comment: Glucose reference range applies only to samples taken after fasting for at least 8 hours.   BUN 10 8 - 23 mg/dL   Creatinine, Ser 0.54 0.44 - 1.00 mg/dL   Calcium 8.5 (L) 8.9 - 10.3 mg/dL   GFR calc non Af Amer >60 >60 mL/min   GFR calc Af Amer >60 >60 mL/min   Anion gap 6 5 - 15    Comment: Performed at [facility], [address] [place], [place] [address]  Glucose, capillary     Status: None   Collection Time: 05/05/20  7:38 AM  Result Value Ref Range   Glucose-Capillary 97 70 - 99 mg/dL    Comment: Glucose reference range applies only to samples taken after fasting for at least 8 hours.  Glucose, capillary     Status: Abnormal   Collection Time: 05/05/20 11:40 AM  Result Value Ref Range   Glucose-Capillary 129 (H) 70 - 99 mg/dL    Comment: Glucose reference range applies only to samples taken after fasting for at least 8 hours.  CBC     Status: Abnormal   Collection Time: 05/06/20 10:48 AM  Result Value Ref Range   WBC 7.5 4.0 - 10.5 K/uL   RBC 4.21 3.87 - 5.11 MIL/uL   Hemoglobin 10.0 (L) 12.0 - 15.0 g/dL   HCT 33.0 (L) 36 - 46 %   MCV 78.4 (L) 80.0 - 100.0 fL   MCH 23.8 (L) 26.0 - 34.0 pg   MCHC 30.3 30.0 - 36.0 g/dL   RDW 27.7 (H) 11.5 - 15.5 %   Platelets 261 150 - 400 K/uL   nRBC 0.0 0.0 - 0.2 %    Comment: Performed at [facility], [address], [place], [place] [address]    Radiology: DG Chest 1 View  Result Date: 05/02/2020 CLINICAL DATA:  Pneumothorax EXAM: CHEST  1 VIEW COMPARISON:  May 01, 2020 FINDINGS: Pigtail catheter present on the right with persistent small right apical pneumothorax. No tension component. Scattered areas of atelectatic change noted on the right. Lungs elsewhere are clear. Heart size and pulmonary vascularity are normal. No adenopathy. No bone lesions. IMPRESSION: Chest tube unchanged in position on the right with small right apical pneumothorax, stable. No tension component.  Scattered areas of atelectatic change on the right. Lungs elsewhere clear. Stable cardiac silhouette. Electronically Signed   By: Lowella Grip III M.D.   On: 05/02/2020 09:28   DG Chest 2 View  Result Date: 05/01/2020 CLINICAL DATA:  Shortness of breath worsening since yesterday. Lung biopsy 1 week ago. EXAM: CHEST - 2 VIEW COMPARISON:  04/24/2020 FINDINGS: Heart size is normal. Left chest is clear. Right pneumothorax, approximately 40%, without evidence of tension. No hemothorax. IMPRESSION: Right pneumothorax, approximately 40%, without evidence of tension. These results were called by telephone at the time of interpretation on 05/01/2020 at 10:19 am to provider PHILLIP STAFFORD , who verbally acknowledged these results. Electronically Signed   By: Nelson Chimes M.D.   On: 05/01/2020 10:19   DG Chest Portable 1 View  Result Date: 05/01/2020 CLINICAL DATA:  Chest tube for pneumothorax. EXAM: PORTABLE CHEST 1 VIEW COMPARISON:  05/01/2020 FINDINGS: Interval placement of right pigtail chest catheter in satisfactory position. Improvement in right pneumothorax which now is small in the apex. Minimal right pleural effusion. Right lower lobe atelectasis has improved aeration. Left lung remains clear. No heart failure. IMPRESSION: Satisfactory right  chest tube with improvement in right pneumothorax. Residual small right apical pneumothorax. Improved aeration right lung base. Electronically Signed   By: Franchot Gallo M.D.   On: 05/01/2020 14:40    No results found.  DG Chest 1 View  Result Date: 05/03/2020 CLINICAL DATA:  Right pneumothorax EXAM: CHEST  1 VIEW COMPARISON:  05/02/2020 FINDINGS: Right chest tube remains in place. No visible residual right pneumothorax. Areas of atelectasis or scarring in the right lung. Left lung clear. Heart is normal size. IMPRESSION: Right chest tube remains in place. No visible residual pneumothorax. Electronically Signed   By: Rolm Baptise M.D.   On: 05/03/2020 05:17    DG Chest 1 View  Result Date: 05/02/2020 CLINICAL DATA:  Pneumothorax EXAM: CHEST  1 VIEW COMPARISON:  May 01, 2020 FINDINGS: Pigtail catheter present on the right with persistent small right apical pneumothorax. No tension component. Scattered areas of atelectatic change noted on the right. Lungs elsewhere are clear. Heart size and pulmonary vascularity are normal. No adenopathy. No bone lesions. IMPRESSION: Chest tube unchanged in position on the right with small right apical pneumothorax, stable. No tension component. Scattered areas of atelectatic change on the right. Lungs elsewhere clear. Stable cardiac silhouette. Electronically Signed   By: Lowella Grip III M.D.   On: 05/02/2020 09:28   DG Chest 2 View  Result Date: 05/01/2020 CLINICAL DATA:  Shortness of breath worsening since yesterday. Lung biopsy 1 week ago. EXAM: CHEST - 2 VIEW COMPARISON:  04/24/2020 FINDINGS: Heart size is normal. Left chest is clear. Right pneumothorax, approximately 40%, without evidence of tension. No hemothorax. IMPRESSION: Right pneumothorax, approximately 40%, without evidence of tension. These results were called by telephone at the time of interpretation on 05/01/2020 at 10:19 am to provider PHILLIP STAFFORD , who verbally acknowledged these results. Electronically Signed   By: Nelson Chimes M.D.   On: 05/01/2020 10:19   CT ANGIO CHEST PE W OR WO CONTRAST  Result Date: 04/11/2020 CLINICAL DATA:  Chest pain.  History of lung cancer. EXAM: CT ANGIOGRAPHY CHEST WITH CONTRAST TECHNIQUE: Multidetector CT imaging of the chest was performed using the standard protocol during bolus administration of intravenous contrast. Multiplanar CT image reconstructions and MIPs were obtained to evaluate the vascular anatomy. CONTRAST:  26mL OMNIPAQUE IOHEXOL 350 MG/ML SOLN COMPARISON:  March 04, 2020. FINDINGS: Cardiovascular: Satisfactory opacification of the pulmonary arteries to the segmental level. No evidence of  pulmonary embolism. Normal heart size. No pericardial effusion. Mediastinum/Nodes: No enlarged mediastinal, hilar, or axillary lymph nodes. Thyroid gland, trachea, and esophagus demonstrate no significant findings. Lungs/Pleura: No pneumothorax or pleural effusion is noted. Left lung is unremarkable. Stable nodular densities are noted in the right lower lobe concerning for metastatic disease. Stable scarring or fibrosis is noted in the right middle lobe. Upper Abdomen: No acute abnormality. Musculoskeletal: Old right rib fracture is noted. Review of the MIP images confirms the above findings. IMPRESSION: 1. No definite evidence of pulmonary embolus. 2. Stable nodular densities are noted in the right lower lobe concerning for metastatic disease. 3. Stable scarring or fibrosis is noted in the right middle lobe. Electronically Signed   By: Marijo Conception M.D.   On: 04/11/2020 13:49   CT Biopsy  Result Date: 04/24/2020 INDICATION: History of lung cancer, now with enlarging right lower lobe pulmonary nodules. Please perform CT-guided biopsy for tissue diagnostic purposes. EXAM: CT-GUIDED RIGHT LOWER LOBE PULMONARY BIOPSY COMPARISON:  Chest CT-03/04/2020; 07/19/2019; PET-CT-03/20/2020 MEDICATIONS: None. ANESTHESIA/SEDATION: Fentanyl 25 mcg IV; Versed  0.5 mg IV Sedation time: 16 minutes; The patient was continuously monitored during the procedure by the interventional radiology nurse under my direct supervision. CONTRAST:  None COMPLICATIONS: SIR Level A - No therapy, no consequence. Procedure complicated by development of a small amount of self limited asymptomatic Peri biopsy hemorrhage. PROCEDURE: Informed consent was obtained from the patient following an explanation of the procedure, risks, benefits and alternatives. The patient understands,agrees and consents for the procedure. All questions were addressed. A time out was performed prior to the initiation of the procedure. The patient was positioned supine,  slightly LPO on the CT table and a limited chest CT was performed for procedural planning demonstrating grossly unchanged size and appearance of right lower lobe pulmonary nodules with dominant right lower lobe nodule measuring approximately 1.2 x 1.0 cm (image 17, series 5). The operative site was prepped and draped in the usual sterile fashion. Under sterile conditions and local anesthesia, a 17 gauge coaxial needle was advanced into the peripheral aspect of the nodule. Positioning was confirmed with intermittent CT fluoroscopy and followed by the acquisition of 2 core needle biopsies with an 18 gauge core needle biopsy device. Note, additional biopsies were unable to be acquired secondary to development of peribiopsy hemorrhage and obscuration of the nodule. The coaxial needle was removed following deployment of a [employer] plug and superficial hemostasis was achieved with manual compression. Limited post procedural chest CT demonstrated a non enlarging asymptomatic peri biopsy hemorrhage but without evidence of pneumothorax or additional complication. A dressing was placed. The patient tolerated the procedure well without immediate postprocedural complication. The patient was escorted to have an upright chest radiograph. IMPRESSION: Technically successful CT guided core needle core biopsy of indeterminate right lower lobe pulmonary nodule. Procedure complicated by development of an asymptomatic peri biopsy hemorrhage however additional samples were unable to be acquired secondary to obscuration of the nodule due to the hemorrhage. Electronically Signed   By: Sandi Mariscal M.D.   On: 04/24/2020 15:05   DG Chest Port 1 View  Result Date: 05/05/2020 CLINICAL DATA:  Pneumothorax after biopsy 04/24/2020. Chest tube placed 05/01/2020 and removed 05/04/2020. EXAM: PORTABLE CHEST 1 VIEW COMPARISON:  05/04/2020 and 05/02/2020 FINDINGS: Lungs are adequately inflated. Minimal stable patchy density over the right mid to  lower lung. No evidence of right-sided pneumothorax. Left lung is clear. Cardiomediastinal silhouette and remainder of the exam is unchanged. IMPRESSION: 1. No acute findings. 2. Stable patchy density over the right mid to lower lung. No pneumothorax. Electronically Signed   By: Marin Olp M.D.   On: 05/05/2020 09:43   DG Chest Port 1 View  Result Date: 05/04/2020 CLINICAL DATA:  Postop check EXAM: PORTABLE CHEST 1 VIEW COMPARISON:  05/03/2020 FINDINGS: Right-sided pigtail pleural drainage catheter unchanged. Stable patchy density right base likely scarring. No pneumothorax. Left lung is clear. Cardiomediastinal silhouette and remainder of the exam is unchanged. IMPRESSION: Stable right base scarring. Right-sided pigtail pleural drainage catheter unchanged. No pneumothorax. Electronically Signed   By: Marin Olp M.D.   On: 05/04/2020 09:13   DG Chest Portable 1 View  Result Date: 05/01/2020 CLINICAL DATA:  Chest tube for pneumothorax. EXAM: PORTABLE CHEST 1 VIEW COMPARISON:  05/01/2020 FINDINGS: Interval placement of right pigtail chest catheter in satisfactory position. Improvement in right pneumothorax which now is small in the apex. Minimal right pleural effusion. Right lower lobe atelectasis has improved aeration. Left lung remains clear. No heart failure. IMPRESSION: Satisfactory right chest tube with improvement in right pneumothorax. Residual  small right apical pneumothorax. Improved aeration right lung base. Electronically Signed   By: Franchot Gallo M.D.   On: 05/01/2020 14:40   DG Chest Port 1 View  Result Date: 04/24/2020 CLINICAL DATA:  Post right lower lobe pulmonary nodule biopsy EXAM: PORTABLE CHEST 1 VIEW COMPARISON:  02/11/2018; CT-guided right lower lobe pulmonary nodule biopsy-earlier same day FINDINGS: Grossly unchanged cardiac silhouette and mediastinal contours. The lungs remain hyperexpanded with mild diffuse slightly nodular thickening of the pulmonary interstitium. Improved  aeration of the right lower lung. Known right lower lung nodular opacities are suboptimally evaluated on the present examination. Post treatment change involving the right mid lung. No pleural effusion or pneumothorax. No evidence of edema. No acute osseous abnormalities. IMPRESSION: No evidence of complication following right lower lobe pulmonary nodule biopsy. Specifically, no pneumothorax. Electronically Signed   By: Sandi Mariscal M.D.   On: 04/24/2020 14:29      Assessment and Plan: Patient Active Problem List   Diagnosis Date Noted   Pneumothorax after biopsy 05/03/2020   Pneumothorax on right 05/01/2020   Chest pain on breathing 04/21/2020   Pleuritic chest pain 04/11/2020   Right lower lobe pulmonary nodule 04/02/2020   Iron deficiency anemia 04/02/2020   Pica in adults 04/02/2020   Goals of care, counseling/discussion 03/14/2020   Acute non-recurrent pansinusitis 01/01/2020   Pain in left wrist 09/24/2019   Encounter for general adult medical examination with abnormal findings 06/28/2019   Elevated total protein 01/22/2019   Uncontrolled type 2 diabetes mellitus with hyperglycemia ([place]) 05/16/2018   Chronic obstructive pulmonary disease ([place]) 05/16/2018   Vitamin D deficiency 05/16/2018   Flu vaccine need 05/16/2018   Dysuria 05/16/2018   Cancer of lower lobe of right lung ([place]) 01/21/2018   Screening for breast cancer 10/12/2017   Personal history of tobacco use, presenting hazards to health 02/03/2017   PAD (peripheral artery disease) ([place]) 06/22/2016   Essential hypertension 05/26/2016   Diabetes ([place]) 05/26/2016   Hyperlipidemia 05/26/2016   Atherosclerosis of native arteries of extremity with intermittent claudication ([place]) 05/26/2016   Uterine leiomyoma 04/30/2016   Elevated CEA 01/31/2016   Special screening for malignant neoplasms, colon    Benign neoplasm of sigmoid colon    Primary lung cancer ([place])    Malignant neoplasm of upper  lobe of right lung ([place])     1. RLL lung cancer being seen by oncology she is going to likely be a radiation chemo candidate.  Not a candidate for surgical resection this is a reoccurrence of her tumor with poor lung function. 2. COPD very severe based on the last PFTs patient will get follow-up pulmonary function studies done for reassessment. 3. Chronic respiratory failure supportive care she has been on oxygen 4. Oxygen dependent she continues to use the oxygen at nighttime. 5. PTX recent admission for pneumothorax which is now resolved.  General Counseling: I have discussed the findings of the evaluation and examination with [facility].  I have also discussed any further diagnostic evaluation thatmay be needed or ordered today. Hargun verbalizes understanding of the findings of todays visit. We also reviewed her medications today and discussed drug interactions and side effects including but not limited excessive drowsiness and altered mental states. We also discussed that there is always a risk not just to her but also people around her. she has been encouraged to call the office with any questions or concerns that should arise related to todays visit.  Orders Placed This Encounter  Procedures  Pulmonary function test    Standing Status:   Future    Standing Expiration Date:   05/09/2021    Order Specific Question:   Where should this test be performed?    Answer:   [facility]     Time spent: 75  I have personally obtained a history, examined the patient, evaluated laboratory and imaging results, formulated the assessment and plan and placed orders.    Allyne Gee, MD [facility] Pulmonary and Critical Care Sleep medicine

## 2020-05-09 NOTE — Patient Instructions (Signed)
Pulmonary Nodule A pulmonary nodule is tissue that has grown on your lung. A nodule may be cancer, but most nodules are not cancer. Follow these instructions at home:   Take over-the-counter and prescription medicines only as told by your doctor.  Do not use any products that have nicotine or tobacco, such as cigarettes and e-cigarettes. If you need help quitting, ask your doctor.  Keep all follow-up visits as told by your doctor. This is important. Contact a doctor if:  You have trouble breathing when doing activities.  You feel sick.  You feel more tired than normal.  You do not feel like eating.  You lose weight without trying.  You have chills.  You have night sweats. Get help right away if:  You cannot catch your breath.  You start making whistling sounds when breathing (wheezing).  You cannot stop coughing.  You cough up blood.  You get dizzy.  You feel like you are going to pass out (faint).  You have sudden chest pain.  You have a fever or symptoms for more than 2-3 days.  You have a fever and your symptoms suddenly get worse. Summary  A pulmonary nodule is tissue that has grown on your lung.  Most nodules are not cancer.  Your doctor will do tests to know what kind of nodule you have, and whether you need treatment for it. This information is not intended to replace advice given to you by your health care provider. Make sure you discuss any questions you have with your health care provider. Document Revised: 08/27/2017 Document Reviewed: 09/01/2016 Elsevier Patient Education  [place].

## 2020-05-10 ENCOUNTER — Ambulatory Visit
Admission: RE | Admit: 2020-05-10 | Discharge: 2020-05-10 | Disposition: A | Discharge status: Home / Self Care | Payer: BLUE CROSS/BLUE SHIELD | Source: Ambulatory Visit | Attending: Radiation Oncology | Admitting: Radiation Oncology

## 2020-05-10 ENCOUNTER — Encounter: Payer: Self-pay | Admitting: Radiation Oncology

## 2020-05-10 VITALS — BP 143/80 | HR 102 | Temp 97.0°F | Wt 109.0 lb

## 2020-05-10 DIAGNOSIS — C3431 Malignant neoplasm of lower lobe, right bronchus or lung: Secondary | ICD-10-CM

## 2020-05-10 NOTE — Progress Notes (Signed)
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 female presents to the clinic today for evaluation of multiple nodular's in the right lower lobe CT-guided biopsy positive for adenocarcinoma and patient previously treated to her right upper lobe with SBRT.  REFERRING PROVIDER: Ronnell Freshwater, NP  HPI: Patient is a 64 year old female out over 2 years having completed SBRT to her right upper lobe for stage I adenocarcinoma..  She has done well although recently has been noted to have enlarging nodules in the right lower lobe by CT criteria.  September 8 she underwent a CT-guided biopsy showing adenocarcinoma with papillary and solid patterns.  She did develop a pneumothorax after completion of her CT-guided biopsy.  She does have poor lung function FEV of point 6L and is not a surgical candidate.  She has 3 distinct nodules in the right lower lobe concerning for malignancy she is now referred back to radiation oncology for consideration of SBRT which was the recommendation of tumor board.  She is doing well specifically [place] cough hemoptysis or chest tightness.  She is not on nasal oxygen.  Right lower lobe lesions are not hypermetabolic on PET CT scan which I have reviewed.  COMPLICATIONS OF TREATMENT: none  FOLLOW UP COMPLIANCE: keeps appointments   PHYSICAL EXAM:  BP (!) 143/80 (BP Location: Left Arm, Patient Position: Sitting, Cuff Size: Normal)   Pulse (!) 102   Temp (!) 97 F (36.1 C) (Tympanic)   Wt 109 lb (49.4 kg)   SpO2 99% Comment: room air  BMI 22.02 kg/m  Well-developed well-nourished patient in NAD. HEENT reveals PERLA, EOMI, discs not visualized.  Oral cavity is clear. No oral mucosal lesions are identified. Neck is clear without evidence of cervical or supraclavicular adenopathy. Lungs are clear to A&P. Cardiac examination is essentially unremarkable with  regular rate and rhythm without murmur rub or thrill. Abdomen is benign with no organomegaly or masses noted. Motor sensory and DTR levels are equal and symmetric in the upper and lower extremities. Cranial nerves II through XII are grossly intact. Proprioception is intact. No peripheral adenopathy or edema is identified. No motor or sensory levels are noted. Crude visual fields are within normal range.  RADIOLOGY RESULTS: CT scans and PET CT scans reviewed compatible with above-stated findings  PLAN: Present time elect go ahead with SBRT to all 3 lesions at one time.  Would plan on delivering 60 Gy in 5 fractions.  Risks and benefits of treatment including loss of normal lung volume fatigue possible development of cough all were described in detail to the patient.  She comprehends her treatment plan well have personally set up and ordered CT simulation in about a week's time.  We will use motion restriction as well as 4-dimensional treatment planning.  Patient comprehends my recommendations well.  I would like to take this opportunity to thank you for allowing me to participate in the care of your patient.Noreene Filbert, MD

## 2020-05-13 ENCOUNTER — Telehealth: Payer: Self-pay

## 2020-05-13 NOTE — Telephone Encounter (Signed)
LMOM for ov on 9/29

## 2020-05-15 ENCOUNTER — Other Ambulatory Visit: Payer: Self-pay

## 2020-05-15 ENCOUNTER — Ambulatory Visit (INDEPENDENT_AMBULATORY_CARE_PROVIDER_SITE_OTHER): Payer: Medicaid Other | Admitting: Internal Medicine

## 2020-05-15 DIAGNOSIS — J449 Chronic obstructive pulmonary disease, unspecified: Secondary | ICD-10-CM | POA: Diagnosis not present

## 2020-05-15 LAB — PULMONARY FUNCTION TEST

## 2020-05-16 ENCOUNTER — Encounter: Payer: Self-pay | Admitting: Hematology and Oncology

## 2020-05-20 ENCOUNTER — Ambulatory Visit
Admission: RE | Admit: 2020-05-20 | Discharge: 2020-05-20 | Disposition: A | Discharge status: Home / Self Care | Payer: BLUE CROSS/BLUE SHIELD | Source: Ambulatory Visit | Attending: Radiation Oncology | Admitting: Radiation Oncology

## 2020-05-20 DIAGNOSIS — Z510 Encounter for antineoplastic radiation therapy: Secondary | ICD-10-CM | POA: Diagnosis not present

## 2020-05-20 DIAGNOSIS — C3431 Malignant neoplasm of lower lobe, right bronchus or lung: Secondary | ICD-10-CM | POA: Insufficient documentation

## 2020-05-23 NOTE — Procedures (Signed)
[facility] [place], [id number]  DATE OF SERVICE: May 15, 2020  Complete Pulmonary Function Testing Interpretation:  FINDINGS:  Forced vital capacity is moderately decreased.  FEV1 is 0.48 L which is 28% of predicted and is very severely decreased.  FEV1 FVC ratio is severely decreased.  Total lung capacity is increased residual volume is increased residual volume: Passive ratio is increased FRC is increased.  Patient was not able to perform DLCO maneuver  IMPRESSION:  This pulmonary function study is consistent with very severe obstructive lung disease with hyperinflation.  There was no significant response to bronchodilators.  Allyne Gee, MD [facility] Pulmonary Critical Care Medicine Sleep Medicine

## 2020-05-27 ENCOUNTER — Other Ambulatory Visit: Payer: Self-pay

## 2020-05-27 ENCOUNTER — Inpatient Hospital Stay: Payer: BLUE CROSS/BLUE SHIELD | Attending: Hematology and Oncology

## 2020-05-27 DIAGNOSIS — E1151 Type 2 diabetes mellitus with diabetic peripheral angiopathy without gangrene: Secondary | ICD-10-CM | POA: Diagnosis not present

## 2020-05-27 DIAGNOSIS — F329 Major depressive disorder, single episode, unspecified: Secondary | ICD-10-CM | POA: Diagnosis not present

## 2020-05-27 DIAGNOSIS — Z9981 Dependence on supplemental oxygen: Secondary | ICD-10-CM | POA: Insufficient documentation

## 2020-05-27 DIAGNOSIS — D251 Intramural leiomyoma of uterus: Secondary | ICD-10-CM | POA: Diagnosis not present

## 2020-05-27 DIAGNOSIS — C3431 Malignant neoplasm of lower lobe, right bronchus or lung: Secondary | ICD-10-CM | POA: Diagnosis not present

## 2020-05-27 DIAGNOSIS — J449 Chronic obstructive pulmonary disease, unspecified: Secondary | ICD-10-CM | POA: Diagnosis not present

## 2020-05-27 DIAGNOSIS — I70219 Atherosclerosis of native arteries of extremities with intermittent claudication, unspecified extremity: Secondary | ICD-10-CM | POA: Diagnosis not present

## 2020-05-27 DIAGNOSIS — Z7901 Long term (current) use of anticoagulants: Secondary | ICD-10-CM | POA: Diagnosis not present

## 2020-05-27 DIAGNOSIS — D509 Iron deficiency anemia, unspecified: Secondary | ICD-10-CM | POA: Diagnosis not present

## 2020-05-27 DIAGNOSIS — I10 Essential (primary) hypertension: Secondary | ICD-10-CM | POA: Insufficient documentation

## 2020-05-27 DIAGNOSIS — Z79899 Other long term (current) drug therapy: Secondary | ICD-10-CM | POA: Insufficient documentation

## 2020-05-27 DIAGNOSIS — Z923 Personal history of irradiation: Secondary | ICD-10-CM | POA: Diagnosis not present

## 2020-05-27 DIAGNOSIS — Z7984 Long term (current) use of oral hypoglycemic drugs: Secondary | ICD-10-CM | POA: Diagnosis not present

## 2020-05-27 DIAGNOSIS — M1990 Unspecified osteoarthritis, unspecified site: Secondary | ICD-10-CM | POA: Diagnosis not present

## 2020-05-27 DIAGNOSIS — E7800 Pure hypercholesterolemia, unspecified: Secondary | ICD-10-CM | POA: Insufficient documentation

## 2020-05-27 DIAGNOSIS — Z87891 Personal history of nicotine dependence: Secondary | ICD-10-CM | POA: Diagnosis not present

## 2020-05-27 DIAGNOSIS — Z7982 Long term (current) use of aspirin: Secondary | ICD-10-CM | POA: Insufficient documentation

## 2020-05-27 LAB — CBC WITH DIFFERENTIAL/PLATELET
Abs Immature Granulocytes: 0.03 10*3/uL (ref 0.00–0.07)
Basophils Absolute: 0 10*3/uL (ref 0.0–0.1)
Basophils Relative: 1 %
Eosinophils Absolute: 0.2 10*3/uL (ref 0.0–0.5)
Eosinophils Relative: 3 %
HCT: 34.8 % — ABNORMAL LOW (ref 36.0–46.0)
Hemoglobin: 10.2 g/dL — ABNORMAL LOW (ref 12.0–15.0)
Immature Granulocytes: 1 %
Lymphocytes Relative: 16 %
Lymphs Abs: 0.9 10*3/uL (ref 0.7–4.0)
MCH: 24.9 pg — ABNORMAL LOW (ref 26.0–34.0)
MCHC: 29.3 g/dL — ABNORMAL LOW (ref 30.0–36.0)
MCV: 85.1 fL (ref 80.0–100.0)
Monocytes Absolute: 0.5 10*3/uL (ref 0.1–1.0)
Monocytes Relative: 9 %
Neutro Abs: 4 10*3/uL (ref 1.7–7.7)
Neutrophils Relative %: 70 %
Platelets: 364 10*3/uL (ref 150–400)
RBC: 4.09 MIL/uL (ref 3.87–5.11)
RDW: 27.2 % — ABNORMAL HIGH (ref 11.5–15.5)
WBC: 5.6 10*3/uL (ref 4.0–10.5)
nRBC: 0 % (ref 0.0–0.2)

## 2020-05-27 LAB — FERRITIN: Ferritin: 20 ng/mL (ref 11–307)

## 2020-05-27 NOTE — Progress Notes (Signed)
[facility]  [address], [address] [place], [place] [address] Phone: [phone]  Fax: [phone]   Clinic Day:  05/28/2020  Referring physician: Carlean Jews, NP  Chief Complaint: Angela Jensen is a 64 y.o. female with a history of stage I lung cancer s/p radiationand iron deficiency anemia who is seen for 1 month assessment.  HPI: The patient was last seen in the medical oncology clinic on 04/29/2020. At that time,  she was doing well.  She denied any complaints. Hematocrit was 36.2, hemoglobin 10.9, MCV 75.7, platelets 445,000, WBC 10,600.  The patient was admitted to [facility] from 05/01/2020 - 05/05/2020 for worsening shortness of breath. CXR showed right pneumothorax, approximately 40%, without evidence of tension. She had a pigtail catheter inserted in the ER and removed on 05/04/2020. She was discharged home in stable condition.  The patient received her first SBRT treatment today. She is receiving a total of 5 treatment over the next 3 weeks (two the first two weeks, one the last week). Her last day of radiation is 06/11/2020.  The patient saw Angela Jensen on 05/09/2020. She was doing very well. She continued to use oxygen at night. Pulmonary function test was c/w very severe obstructive lung disease with hyperinflation. There was no significant response to bronchodilators  She has an appointment with Angela Jensen on 07/23/2020.  CBCs followed: 05/06/2020:  Hematocrit 33.0, hemoglobin 10.0, MCV 78.4, platelets 261,000, WBC 7,500. 05/27/2020:  Hematocrit 34.8, hemoglobin 10.2, MCV 85.1, platelets 364,000, WBC 5,600.  Ferritin 20.  During the interim, she has been "fine." Her back pain has resolved. She is eating a variety of iron-rich foods and taking oral iron BID. To her knowledge, he diabetes is stable. Since leaving the hospital, she has not had any problems. She is still using oxygen at night. She denies constipation.   Past Medical History:   Diagnosis Date  . Arthritis   . Asthma   . 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/2016  . Cancer [facility]) 2012   Right Lung CA  . COPD (chronic obstructive pulmonary disease) (HCC)   . Depression   . Diabetes mellitus without complication [facility])    Patient takes Janumet  . Essential hypertension 05/26/2016  . Hypercholesteremia   . Hypertension   . 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 PAD (peripheral artery disease) (HCC) 06/22/2016  . Peripheral vascular disease (HCC)   . Personal history of radiation therapy   . Shortness of breath dyspnea    with exertion   . Sleep apnea     Past Surgical History:  Procedure Laterality Date  . CESAREAN SECTION     x3  . COLONOSCOPY WITH PROPOFOL N/A 06/25/2015   Procedure: COLONOSCOPY WITH PROPOFOL;  Surgeon: Angela Minium, MD;  Location: [facility] ENDOSCOPY;  Service: Endoscopy;  Laterality: N/A;  . CYST REMOVAL LEG     and on shoulder   . LOWER EXTREMITY ANGIOGRAPHY Left 09/29/2018   Procedure: LOWER EXTREMITY ANGIOGRAPHY;  Surgeon: Angela Needy, MD;  Location: [facility] INVASIVE CV LAB;  Service: Cardiovascular;  Laterality: Left;  . LUNG BIOPSY  05 15 2013   has lung "spots"  . PERIPHERAL VASCULAR CATHETERIZATION Left 06/01/2016   Procedure: Lower Extremity Angiography;  Surgeon: Angela Needy, MD;  Location: [facility] INVASIVE CV LAB;  Service: Cardiovascular;  Laterality: Left;  . PERIPHERAL VASCULAR CATHETERIZATION N/A 06/01/2016   Procedure: Abdominal Aortogram w/Lower Extremity;  Surgeon: Marlow Baars  Lucky Cowboy, MD;  Location: [place] CV LAB;  Service: Cardiovascular;  Laterality: N/A;  . PERIPHERAL VASCULAR CATHETERIZATION  06/01/2016   Procedure: Lower Extremity Intervention;  Surgeon: Angela Huxley, MD;  Location: [place] CV LAB;  Service: Cardiovascular;;  . PERIPHERAL VASCULAR CATHETERIZATION Right 06/08/2016   Procedure: Lower Extremity Angiography;  Surgeon: Angela Huxley, MD;  Location: [place] CV LAB;  Service: Cardiovascular;  Laterality: Right;  . PERIPHERAL VASCULAR CATHETERIZATION  06/08/2016   Procedure: Lower Extremity Intervention;  Surgeon: Angela Huxley, MD;  Location: [place] CV LAB;  Service: Cardiovascular;;    Family History  Problem Relation Age of Onset  . Lung cancer Father   . Diabetes Mother   . Hypercholesterolemia Mother   . Diabetes Sister   . Diabetes Maternal Grandmother   . Diabetes Paternal Grandmother   . Diabetes Sister   . Heart attack Brother   . Coronary artery disease Brother   . Vascular Disease Brother   . Hypertension Sister   . Heart attack Brother     Social History:  reports that she quit smoking about 10 years ago. Her smoking use included cigarettes. She has a 37.00 pack-year smoking history. She has quit using smokeless tobacco.  Her smokeless tobacco use included snuff. She reports current alcohol use of about 5.0 standard drinks of alcohol per week. She reports previous drug use. Drugs: Marijuana, "Crack" cocaine, and Cocaine. She started smoking at 17 until she was "30 something". Her boyfriend is Angela Jensen: [phone]. She lives in [place]. The patient is alone today.  Allergies: No Known Allergies  Current Medications: Current Outpatient Medications  Medication Sig Dispense Refill  . Accu-Chek FastClix Lancets MISC Use as directed twice a day diag E11.65 100 each 1  . albuterol (PROVENTIL) (2.5 MG/3ML) 0.083% nebulizer solution Take 3 mLs (2.5 mg total) by nebulization every 6 (six) hours as needed for wheezing. 75 mL 3  . amLODipine (NORVASC) 10 MG tablet Take 1 tablet (10 mg total) by mouth every morning. 30 tablet 5  . aspirin EC 81 MG tablet Take 1 tablet (81 mg total) by mouth daily. 150 tablet 2  . buPROPion (WELLBUTRIN XL) 150 MG 24 hr tablet TAKE 1 TABLET BY MOUTH TWICE DAILY FOR SMOKING CESSATION (Patient taking differently: Take 150 mg by mouth in the morning and at bedtime. ) 60 tablet 6  .  clopidogrel (PLAVIX) 75 MG tablet Take 1 tablet by mouth once daily (Patient taking differently: Take 75 mg by mouth daily. ) 90 tablet 0  . ferrous sulfate 324 MG TBEC Take 324 mg by mouth.    . gabapentin (NEURONTIN) 100 MG capsule Take 1 capsule by mouth twice daily (Patient taking differently: Take 100 mg by mouth 2 (two) times daily. ) 60 capsule 3  . glucose blood (ACCU-CHEK AVIVA PLUS) test strip Use as instructed to check blood sugars three times daily.  E11.65 250 each 5  . JANUVIA 25 MG tablet Take 1 tablet by mouth once daily (Patient taking differently: Take 25 mg by mouth daily. ) 90 tablet 0  . lisinopril-hydrochlorothiazide (ZESTORETIC) 10-12.5 MG tablet Take 1 tablet by mouth every morning. 30 tablet 5  . metFORMIN (GLUCOPHAGE-XR) 500 MG 24 hr tablet Take 1 tablet (500 mg total) by mouth daily with breakfast. 30 tablet 3  . mometasone-formoterol (DULERA) 200-5 MCG/ACT AERO Inhale 1 puff into the lungs 2 (two) times daily. Please fill as 90 day prescription 39 g 1  . potassium  chloride (KLOR-CON) 10 MEQ tablet TAKE 1 TABLET BY MOUTH EVERY OTHER DAY FOR  LOW  POTASSIUM (Patient taking differently: Take 10 mEq by mouth every other day. ) 45 tablet 3  . rOPINIRole (REQUIP) 0.5 MG tablet Take 1 tablet (0.5 mg total) by mouth 2 (two) times daily. 180 tablet 0  . rosuvastatin (CRESTOR) 10 MG tablet Take 1 tablet (10 mg total) by mouth at bedtime. 30 tablet 5  . SPIRIVA HANDIHALER 18 MCG inhalation capsule INHALE THE CONTENTS OF ONE CAPSULE BY MOUTH ONCE DAILY *DO NOT SWALLOW CAPSULE* 90 capsule 0  . VENTOLIN HFA 108 (90 Base) MCG/ACT inhaler INHALE 2 PUFFS INTO LUNGS EVERY 4 HOURS AS NEEDED FOR WHEEZING OR SHORTNESS OF BREATH (Patient taking differently: Inhale 2 puffs into the lungs every 4 (four) hours as needed for wheezing or shortness of breath. ) 18 g 3   No current facility-administered medications for this visit.    Review of Systems  Constitutional: Negative for chills,  diaphoresis, fever, malaise/fatigue and weight loss (up 3 lbs).       Feels "fine."  HENT: Negative for congestion, ear discharge, ear pain, hearing loss, nosebleeds, sinus pain, sore throat and tinnitus.   Eyes: Negative.  Negative for blurred vision.  Respiratory: Negative for cough, hemoptysis, sputum production and shortness of breath.        Uses oxygen at night.  Cardiovascular: Negative for chest pain, palpitations and leg swelling.  Gastrointestinal: Negative.  Negative for abdominal pain, blood in stool, constipation, diarrhea, heartburn, melena, nausea and vomiting.       Eats a variety of iron-rich foods.  Genitourinary: Negative.  Negative for dysuria, frequency, hematuria and urgency.  Musculoskeletal: Negative for back pain, joint pain, myalgias and neck pain.  Skin: Negative.  Negative for itching and rash.  Neurological: Negative.  Negative for dizziness, tingling, sensory change, speech change, focal weakness, weakness and headaches.  Endo/Heme/Allergies: Negative.  Does not bruise/bleed easily.       Diabetes.  Psychiatric/Behavioral: Negative.  Negative for depression and memory loss. The patient is not nervous/anxious and does not have insomnia.   All other systems reviewed and are negative.   Performance status (ECOG): 0-1  Vitals:  Blood pressure (!) 141/88, pulse 83, temperature (!) 97 F (36.1 C), temperature source Tympanic, resp. rate 18, weight 114 lb 10.2 oz (52 kg), SpO2 100 %.  Physical Exam Vitals and nursing note reviewed.  Constitutional:      General: She is not in acute distress.    Appearance: She is not diaphoretic.  HENT:     Head: Atraumatic.     Comments: Dark hair.    Mouth/Throat:     Mouth: Mucous membranes are moist.     Pharynx: Oropharynx is clear.  Eyes:     General: No scleral icterus.    Conjunctiva/sclera: Conjunctivae normal.     Comments: Glasses.  Brown eyes.  Cardiovascular:     Rate and Rhythm: Normal rate and regular  rhythm.     Pulses: Normal pulses.     Heart sounds: Normal heart sounds.  Pulmonary:     Effort: Pulmonary effort is normal. No respiratory distress.     Breath sounds: Normal breath sounds. No stridor. No wheezing, rhonchi or rales.  Abdominal:     General: There is no distension.     Palpations: Abdomen is soft. There is no hepatomegaly or splenomegaly.     Tenderness: There is no abdominal tenderness.  Musculoskeletal:  General: No swelling.  Lymphadenopathy:     Head:     Right side of head: No preauricular, posterior auricular or occipital adenopathy.     Left side of head: No preauricular, posterior auricular or occipital adenopathy.     Cervical: No cervical adenopathy.     Upper Body:     Right upper body: No supraclavicular or axillary adenopathy.     Left upper body: No supraclavicular or axillary adenopathy.     Lower Body: No right inguinal adenopathy. No left inguinal adenopathy.  Skin:    General: Skin is warm and dry.  Neurological:     Mental Status: She is alert and oriented to person, place, and time.  Psychiatric:        Behavior: Behavior normal.        Thought Content: Thought content normal.        Judgment: Judgment normal.    Imaging studies: 07/31/2011:  Chest CT revealed a 1.1 x 0.6 cm RUL nodule and a 9 mm inferior aspect RUL nodule and a 4 mm nodule in the anterior aspect of the inferior aspect of the RUL. 08/03/2011:  PET scan revealed a 10-mm spiculated right apical pulmonary nodule (SUV 2.5) suspcious for lung cancer. Left ovary was prominent (3.9 x 2.4 cm).  01/30/2015:  PET scanrevealed a hypermetabolic and enlarging RUL pulmonary nodules concerning for lung cancer recurrence. There was a small focus of peripheral nodular consolidation in the superior segment of the right lower lobe with differential including inflammation/infection versus neoplasm. There was no evidence of mediastinal nodal metastasis.  03/27/2016:  PET scanrevealed no  hypermetabolic activity to suggest recurrent malignancy. There was a soft tissue density in the left pelvis near the inguinal ring that was not hypermetabolic, but prominent and probably separate from the left ovary. This could be a wandering fibroid. Although the overall appearance had not changed from 2012 and was not hypermetabolic, pelvic sonography or pelvic MRI for further characterization of the uterus and ovaries can be considered. 08/01/2015:  Chest CTrevealed complete resolution of the prior nodule in the right lung apex.  01/31/2016:  Chest CTrevealed no active cardiopulmonary abnormalities. There was stable appearance of right apical and right middle lobe scar like densities.  02/02/2017:  Low dose chest CTrevealed new ground-glass nodule in the right lower lobe. Lung-RADS 3 (probably benign findings).  07/29/2017:  Low dose chest CT revealed enlargement of the previously observed sub-solid pulmonary lesion. RIGHT upper lobe lesion measured 1.4 x 2.1 x 1.6 cm (previously 1.5 x 1.1 x 1.7 cm). There was also areas of focal architectural distortion in the RIGHT upper and middle lobes that were felt to be most compatible with areas of chronic post infectious or inflammatory scarring. Aortic value calcifications were noted. Lung-RADS 4BS (suspicious).  01/19/2018:  Chest CTrevealed a subsolid 2.1 cm peripheral right lower lobe pulmonary nodule. Although not appreciably changed in size, subjectively this nodule had increased in density, and remains suspicious for primary bronchogenic adenocarcinoma. There was no thoracic adenopathy. 07/18/2018:  Chest CT revealed early postradiation change in the peripheral right lower lobe at the site of the treated tumor, with no residual measurable pulmonary nodule in this location.There were no findings of metastatic disease in the chest. 01/17/2019:  Chest CT without contrast revealed decreased subpleural reticulation at the SBRT site in the RIGHT  lower lobe consistent with improved radiation change. There was residual nodularity along the fissure at this site with recommendation for close attention on follow-up. There was  stable pulmonary scarring in the right upper lobe and right middle lobe. There was centrilobular emphysema. 07/19/2019:  Chest CT revealed a 6 x 6 mm nodule in the right lower lobe had increased in size from 3 mm previously. The nodule was separate and discrete from the previous treatment beds. Metachronous or metastatic disease a concern. Close CT follow up was recommended. There was a 5 mm right lower lobe pulmonary nodule new in the interval. Close attention on follow up was recommended. There was stable appearance of parenchymal scarring in the right lung period.  03/04/2020:  Chest CT revealed enlarging and new pulmonary nodules suspicious for metastatic disease or metachronous primary.  03/19/2020:  PET scan revealed that the right lower lobe pulmonary nodules did not demonstrate any hypermetabolism to suggest neoplasm. However, these were somewhat worrisome; recommendation was close CT surveillance. There was no mediastinal or hilar lymphadenopathy. There were stable right lung scarring changes and significant underlying emphysema.   Appointment on 05/27/2020  Component Date Value Ref Range Status  . Ferritin 05/27/2020 20  11 - 307 ng/mL Final   Performed at [facility], [address] [place], [place] [address]  . WBC 05/27/2020 5.6  4.0 - 10.5 K/uL Final  . RBC 05/27/2020 4.09  3.87 - 5.11 MIL/uL Final  . Hemoglobin 05/27/2020 10.2* 12.0 - 15.0 g/dL Final  . HCT 05/27/2020 34.8* 36 - 46 % Final  . MCV 05/27/2020 85.1  80.0 - 100.0 fL Final  . MCH 05/27/2020 24.9* 26.0 - 34.0 pg Final  . MCHC 05/27/2020 29.3* 30.0 - 36.0 g/dL Final  . RDW 05/27/2020 27.2* 11.5 - 15.5 % Final  . Platelets 05/27/2020 364  150 - 400 K/uL Final  . nRBC 05/27/2020 0.0  0.0 - 0.2 % Final  . Neutrophils Relative %  05/27/2020 70  % Final  . Neutro Abs 05/27/2020 4.0  1.7 - 7.7 K/uL Final  . Lymphocytes Relative 05/27/2020 16  % Final  . Lymphs Abs 05/27/2020 0.9  0.7 - 4.0 K/uL Final  . Monocytes Relative 05/27/2020 9  % Final  . Monocytes Absolute 05/27/2020 0.5  0.1 - 1.0 K/uL Final  . Eosinophils Relative 05/27/2020 3  % Final  . Eosinophils Absolute 05/27/2020 0.2  0 - 0 K/uL Final  . Basophils Relative 05/27/2020 1  % Final  . Basophils Absolute 05/27/2020 0.0  0 - 0 K/uL Final  . Immature Granulocytes 05/27/2020 1  % Final  . Abs Immature Granulocytes 05/27/2020 0.03  0.00 - 0.07 K/uL Final   Performed at [facility], [address] [place], [place] [address]    Assessment:  Angela Jensen is a 64 y.o. 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 She was not a good surgery candidate secondary to her lung function (FEV1 28%).  PET scan on 08/03/2011 revealed a 10-mm spiculated right apical pulmonary nodule (SUV 2.5) suspcious for lung cancer. Left ovary was prominent (3.9 x 2.4 cm). CA-125 was 9.3 (normal).  CT guided biopsy of the superior and inferior RUL noduleson 12/30/2011 revealed well differentiated adenocarcinoma of lung primary. EGFR testing revealed no mutation. She received IMRT 6000 cGyto both nodules from 01/26/2012 - 03/10/2012.  PET scanon 01/30/2015 revealed a hypermetabolic and enlarging RUL pulmonary nodules concerning for lung cancer recurrence. There was a small focus of peripheral nodular consolidation in the superior segment of the right lower lobe with differential including inflammation/infection versus neoplasm. There was no evidence  of mediastinal nodal metastasis.   In 04/2015, there was an attempt at radiofrequency ablation of 1 enlarging RUL nodule. At the time of the procedure, the nodule had shrunk to 5 mm from 9 mm.  Chest CTon 01/19/2018 revealed a subsolid 2.1 cm peripheral right  lower lobe pulmonary nodule. Although not appreciably changed in size, subjectively this nodule had increased in density, and remains suspicious for primary bronchogenic adenocarcinoma. There was no thoracic adenopathy.  CT guided RLL biopsyon 02/09/2018 revealed adenocarcinoma of lung origin, lepidic and papillary patterns.  She received6000 cGy in 5 fractions toRLL nodulefrom 03/22/2018 - 04/05/2018.  Chest CT on 03/04/2020 revealed enlarging and new pulmonary nodules suspicious for metastatic disease or metachronous primary.  PET scan on 03/19/2020 revealed that the right lower lobe pulmonary nodules did not demonstrate any hypermetabolism to suggest neoplasm. However, these were somewhat worrisome; recommendation was close CT surveillance. There was no mediastinal or hilar lymphadenopathy.   CT guided RLL biopsy on 04/24/2020 revealed adenocarcinoma of the lung with papillary and solid patterns.  She has an elevated CEAof unclear etiology. CEA was 8.1 on 01/31/2016, 9.2 on 03/03/2016, 7.9 on 10/12/2017, and 7.9 on 01/19/2019.  Colonoscopyon 06/25/2015 revealed a 4 mm polyp in the sigmoid colon. Pathology revealed a tubular adenoma without evidence of dysplasia or malignancy.  Pelvic MRIon 04/30/2016 revealed a 3.4 x 2.5 cm ovoid mass in the LEFT adnexa with signal characteristics identical to the intramural leiomyomas and consistent with an exophytic leiomyoma. 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 Endometrium was normal with a normal junctional zone. Ovaries were not identified.  She has a history of an elevated total protein.  Protein was 8.3 (6.5-8.1) on 01/19/2019.  M-spike and FLCA were normal.  She has iron deficiency anemia.  She required 2 units of PRBCs on 04/04/2020.  Colonoscopy on 06/25/2015 revealed one 4 mm polyp (tubular adenoma) in the sigmoid colon. There were non-bleeding internal hemorrhoids.  Ferritin has been followed:  10 on 04/11/2020 and 20 on  05/27/2020.  The patient received the [employer] COVID-19 vaccine on 11/20/2019 and 12/11/2019.  Symptomatically,  she feels "fine." Her back pain has resolved. She is eating a variety of iron-rich foods and taking oral iron BID.  She is still using oxygen at night. Exam is stable.  Plan: 1.   Review labs from 05/27/2020. 2.   Stage I RUL adenocarcinoma Patient is s/p IMRT (completed 03/10/2012). Continue to monitor. 3.RLL adenocarcinoma Patient is s/p SBRT (completed 04/05/2018).  PET scan on 03/19/2020 revealed RLL pulmonary nodules were 10 mm and 7 mm.  Lung biopsy from 04/24/2020 confirmed adenocarcinoma of the lung with papillary and solid patterns.    It was histologically very similar to the previous RLL biopsy.    No lepidic pattern was seen.  Patient began SBRT today.  Follow-up [facility] on biopsy ([id number]).  Discuss plans for follow-up chest CT 3 months s/p completion of radiation. 4.   Iron deficiency anemia  Hematocrit 36.2.  Hemoglobin 10.9.  MCV 75.7 on 04/29/2020.   Hematocrit 34.8.  Hemoglobin 10.2.  MCV 85.1 on 05/27/2020.   Ferritin 20.  She is on oral iron twice daily with orange juice or vitamin C.    Patient agrees to follow-up with GI (Angela Allen Norris) as she is s/p lung biopsy.  Review plan for Venofer if hemoglobin drifts down and iron stores do not improve. 5.   Chest CT on 09/11/2020. 6.   RTC on 07/22/2020 for labs (CBC, ferritin). 7.   RTC after  chest CT for MD assessment, labs (CBC with diff, CMP, ferritin), and review of chest CT.  I discussed the assessment and treatment plan with the patient.  The patient was provided an opportunity to ask questions and all were answered.  The patient agreed with the plan and demonstrated an understanding of the instructions.  The patient was advised to call back or seek an in person evaluation if the symptoms worsen or if the condition fails to improve as  anticipated.   Nolon Stalls, MD, PhD  05/28/2020, 1:32 PM  I, Mirian Mo Tufford, am acting as Education administrator for [employer]. Mike Gip, MD, PhD.  I, Alessia Gonsalez C. Mike Gip, MD, have reviewed the above documentation for accuracy and completeness, and I agree with the above.

## 2020-05-28 ENCOUNTER — Inpatient Hospital Stay: Payer: BLUE CROSS/BLUE SHIELD

## 2020-05-28 ENCOUNTER — Other Ambulatory Visit: Payer: Self-pay | Admitting: Nurse Practitioner

## 2020-05-28 ENCOUNTER — Ambulatory Visit: Payer: BLUE CROSS/BLUE SHIELD

## 2020-05-28 ENCOUNTER — Ambulatory Visit
Admission: RE | Admit: 2020-05-28 | Discharge: 2020-05-28 | Disposition: A | Discharge status: Home / Self Care | Payer: BLUE CROSS/BLUE SHIELD | Source: Ambulatory Visit | Attending: Radiation Oncology | Admitting: Radiation Oncology

## 2020-05-28 ENCOUNTER — Inpatient Hospital Stay (HOSPITAL_BASED_OUTPATIENT_CLINIC_OR_DEPARTMENT_OTHER): Payer: BLUE CROSS/BLUE SHIELD | Admitting: Hematology and Oncology

## 2020-05-28 ENCOUNTER — Encounter: Payer: Self-pay | Admitting: Hematology and Oncology

## 2020-05-28 VITALS — BP 141/88 | HR 83 | Temp 97.0°F | Resp 18 | Wt 114.6 lb

## 2020-05-28 DIAGNOSIS — D509 Iron deficiency anemia, unspecified: Secondary | ICD-10-CM | POA: Diagnosis not present

## 2020-05-28 DIAGNOSIS — C3411 Malignant neoplasm of upper lobe, right bronchus or lung: Secondary | ICD-10-CM | POA: Diagnosis not present

## 2020-05-28 DIAGNOSIS — Z1231 Encounter for screening mammogram for malignant neoplasm of breast: Secondary | ICD-10-CM

## 2020-05-28 DIAGNOSIS — C3431 Malignant neoplasm of lower lobe, right bronchus or lung: Secondary | ICD-10-CM | POA: Diagnosis not present

## 2020-05-29 ENCOUNTER — Ambulatory Visit: Payer: BLUE CROSS/BLUE SHIELD

## 2020-05-30 ENCOUNTER — Ambulatory Visit
Admission: RE | Admit: 2020-05-30 | Discharge: 2020-05-30 | Disposition: A | Discharge status: Home / Self Care | Payer: BLUE CROSS/BLUE SHIELD | Source: Ambulatory Visit | Attending: Radiation Oncology | Admitting: Radiation Oncology

## 2020-05-30 ENCOUNTER — Ambulatory Visit: Payer: BLUE CROSS/BLUE SHIELD

## 2020-05-30 DIAGNOSIS — C3431 Malignant neoplasm of lower lobe, right bronchus or lung: Secondary | ICD-10-CM | POA: Diagnosis not present

## 2020-05-31 ENCOUNTER — Ambulatory Visit: Payer: BLUE CROSS/BLUE SHIELD

## 2020-06-04 ENCOUNTER — Ambulatory Visit
Admission: RE | Admit: 2020-06-04 | Discharge: 2020-06-04 | Disposition: A | Discharge status: Home / Self Care | Payer: BLUE CROSS/BLUE SHIELD | Source: Ambulatory Visit | Attending: Radiation Oncology | Admitting: Radiation Oncology

## 2020-06-04 DIAGNOSIS — C3431 Malignant neoplasm of lower lobe, right bronchus or lung: Secondary | ICD-10-CM | POA: Diagnosis not present

## 2020-06-06 ENCOUNTER — Ambulatory Visit
Admission: RE | Admit: 2020-06-06 | Discharge: 2020-06-06 | Disposition: A | Discharge status: Home / Self Care | Payer: BLUE CROSS/BLUE SHIELD | Source: Ambulatory Visit | Attending: Radiation Oncology | Admitting: Radiation Oncology

## 2020-06-06 DIAGNOSIS — C3431 Malignant neoplasm of lower lobe, right bronchus or lung: Secondary | ICD-10-CM | POA: Diagnosis not present

## 2020-06-07 NOTE — Telephone Encounter (Signed)
Done

## 2020-06-10 ENCOUNTER — Ambulatory Visit: Payer: BLUE CROSS/BLUE SHIELD | Admitting: Radiation Oncology

## 2020-06-11 ENCOUNTER — Ambulatory Visit
Admission: RE | Admit: 2020-06-11 | Discharge: 2020-06-11 | Disposition: A | Discharge status: Home / Self Care | Payer: BLUE CROSS/BLUE SHIELD | Source: Ambulatory Visit | Attending: Radiation Oncology | Admitting: Radiation Oncology

## 2020-06-11 DIAGNOSIS — C3431 Malignant neoplasm of lower lobe, right bronchus or lung: Secondary | ICD-10-CM | POA: Diagnosis not present

## 2020-06-13 ENCOUNTER — Other Ambulatory Visit: Payer: Self-pay

## 2020-06-13 MED ORDER — GABAPENTIN 100 MG PO CAPS: 100.0000 mg | ORAL_CAPSULE | Freq: Two times a day (BID) | ORAL | 3 refills | Status: DC

## 2020-06-13 MED ORDER — VENTOLIN HFA 108 (90 BASE) MCG/ACT IN AERS
INHALATION_SPRAY | RESPIRATORY_TRACT | 3 refills | Status: DC
Start: 2020-06-13 — End: 2020-06-14

## 2020-06-14 ENCOUNTER — Other Ambulatory Visit: Payer: Self-pay

## 2020-06-14 MED ORDER — GABAPENTIN 100 MG PO CAPS
100.0000 mg | ORAL_CAPSULE | Freq: Two times a day (BID) | ORAL | 3 refills | Status: DC
Start: 2020-06-14 — End: 2020-08-22

## 2020-06-14 MED ORDER — VENTOLIN HFA 108 (90 BASE) MCG/ACT IN AERS
INHALATION_SPRAY | RESPIRATORY_TRACT | 3 refills | Status: DC
Start: 2020-06-14 — End: 2020-11-04

## 2020-06-19 ENCOUNTER — Encounter: Payer: Self-pay | Admitting: Hematology and Oncology

## 2020-06-24 ENCOUNTER — Other Ambulatory Visit: Payer: Self-pay | Admitting: Internal Medicine

## 2020-06-24 ENCOUNTER — Other Ambulatory Visit: Payer: Self-pay

## 2020-06-24 ENCOUNTER — Other Ambulatory Visit (INDEPENDENT_AMBULATORY_CARE_PROVIDER_SITE_OTHER): Payer: Self-pay | Admitting: Nurse Practitioner

## 2020-06-24 DIAGNOSIS — I10 Essential (primary) hypertension: Secondary | ICD-10-CM

## 2020-06-24 DIAGNOSIS — E1165 Type 2 diabetes mellitus with hyperglycemia: Secondary | ICD-10-CM

## 2020-06-24 DIAGNOSIS — I739 Peripheral vascular disease, unspecified: Secondary | ICD-10-CM

## 2020-06-24 MED ORDER — LISINOPRIL-HYDROCHLOROTHIAZIDE 10-12.5 MG PO TABS: 1.0000 | ORAL_TABLET | Freq: Every morning | ORAL | 5 refills | Status: DC

## 2020-06-24 MED ORDER — METFORMIN HCL ER 500 MG PO TB24: 500.0000 mg | ORAL_TABLET | Freq: Every day | ORAL | 3 refills | Status: DC

## 2020-06-24 MED ORDER — ROPINIROLE HCL 0.5 MG PO TABS
0.5000 mg | ORAL_TABLET | Freq: Two times a day (BID) | ORAL | 0 refills | Status: DC
Start: 2020-06-24 — End: 2020-09-20

## 2020-06-27 ENCOUNTER — Encounter: Payer: BLUE CROSS/BLUE SHIELD | Admitting: Nurse Practitioner

## 2020-06-30 ENCOUNTER — Emergency Department: Payer: BLUE CROSS/BLUE SHIELD

## 2020-06-30 ENCOUNTER — Emergency Department
Admission: EM | Admit: 2020-06-30 | Discharge: 2020-06-30 | Disposition: A | Discharge status: Home / Self Care | Payer: BLUE CROSS/BLUE SHIELD | Attending: Emergency Medicine | Admitting: Emergency Medicine

## 2020-06-30 ENCOUNTER — Encounter: Payer: Self-pay | Admitting: Physician Assistant

## 2020-06-30 ENCOUNTER — Other Ambulatory Visit: Payer: Self-pay

## 2020-06-30 DIAGNOSIS — W01198A Fall on same level from slipping, tripping and stumbling with subsequent striking against other object, initial encounter: Secondary | ICD-10-CM | POA: Insufficient documentation

## 2020-06-30 DIAGNOSIS — Z923 Personal history of irradiation: Secondary | ICD-10-CM | POA: Insufficient documentation

## 2020-06-30 DIAGNOSIS — Z7982 Long term (current) use of aspirin: Secondary | ICD-10-CM | POA: Diagnosis not present

## 2020-06-30 DIAGNOSIS — I70219 Atherosclerosis of native arteries of extremities with intermittent claudication, unspecified extremity: Secondary | ICD-10-CM | POA: Diagnosis not present

## 2020-06-30 DIAGNOSIS — R42 Dizziness and giddiness: Secondary | ICD-10-CM | POA: Insufficient documentation

## 2020-06-30 DIAGNOSIS — M545 Low back pain, unspecified: Secondary | ICD-10-CM | POA: Insufficient documentation

## 2020-06-30 DIAGNOSIS — W19XXXA Unspecified fall, initial encounter: Secondary | ICD-10-CM

## 2020-06-30 DIAGNOSIS — E1169 Type 2 diabetes mellitus with other specified complication: Secondary | ICD-10-CM | POA: Insufficient documentation

## 2020-06-30 DIAGNOSIS — M25552 Pain in left hip: Secondary | ICD-10-CM | POA: Insufficient documentation

## 2020-06-30 DIAGNOSIS — E1151 Type 2 diabetes mellitus with diabetic peripheral angiopathy without gangrene: Secondary | ICD-10-CM | POA: Insufficient documentation

## 2020-06-30 DIAGNOSIS — Z87891 Personal history of nicotine dependence: Secondary | ICD-10-CM | POA: Diagnosis not present

## 2020-06-30 DIAGNOSIS — J45909 Unspecified asthma, uncomplicated: Secondary | ICD-10-CM | POA: Insufficient documentation

## 2020-06-30 DIAGNOSIS — R109 Unspecified abdominal pain: Secondary | ICD-10-CM | POA: Diagnosis not present

## 2020-06-30 DIAGNOSIS — E1165 Type 2 diabetes mellitus with hyperglycemia: Secondary | ICD-10-CM | POA: Insufficient documentation

## 2020-06-30 DIAGNOSIS — Z79899 Other long term (current) drug therapy: Secondary | ICD-10-CM | POA: Diagnosis not present

## 2020-06-30 DIAGNOSIS — Z7951 Long term (current) use of inhaled steroids: Secondary | ICD-10-CM | POA: Diagnosis not present

## 2020-06-30 DIAGNOSIS — J449 Chronic obstructive pulmonary disease, unspecified: Secondary | ICD-10-CM | POA: Insufficient documentation

## 2020-06-30 DIAGNOSIS — Z86010 Personal history of colonic polyps: Secondary | ICD-10-CM | POA: Insufficient documentation

## 2020-06-30 DIAGNOSIS — E785 Hyperlipidemia, unspecified: Secondary | ICD-10-CM | POA: Diagnosis not present

## 2020-06-30 DIAGNOSIS — I10 Essential (primary) hypertension: Secondary | ICD-10-CM | POA: Diagnosis not present

## 2020-06-30 LAB — URINALYSIS, COMPLETE (UACMP) WITH MICROSCOPIC
Bacteria, UA: NONE SEEN
Bilirubin Urine: NEGATIVE
Glucose, UA: NEGATIVE mg/dL
Hgb urine dipstick: NEGATIVE
Ketones, ur: 5 mg/dL — AB
Leukocytes,Ua: NEGATIVE
Nitrite: NEGATIVE
Protein, ur: NEGATIVE mg/dL
Specific Gravity, Urine: 1.023 (ref 1.005–1.030)
pH: 5 (ref 5.0–8.0)

## 2020-06-30 MED ORDER — HYDROCODONE-ACETAMINOPHEN 5-325 MG PO TABS
1.0000 | ORAL_TABLET | Freq: Once | ORAL | Status: AC
  Administered 2020-06-30: 1 via ORAL
  Filled 2020-06-30: qty 1

## 2020-06-30 MED ORDER — CYCLOBENZAPRINE HCL 5 MG PO TABS: 5.0000 mg | ORAL_TABLET | Freq: Three times a day (TID) | ORAL | 0 refills | Status: DC | PRN

## 2020-06-30 MED ORDER — CYCLOBENZAPRINE HCL 10 MG PO TABS
5.0000 mg | ORAL_TABLET | Freq: Once | ORAL | Status: AC
  Administered 2020-06-30: 5 mg via ORAL
  Filled 2020-06-30: qty 1

## 2020-06-30 MED ORDER — HYDROCODONE-ACETAMINOPHEN 5-325 MG PO TABS: 1.0000 | ORAL_TABLET | Freq: Three times a day (TID) | ORAL | 0 refills | Status: DC | PRN

## 2020-06-30 MED ORDER — LIDOCAINE 5 % EX PTCH
1.0000 | MEDICATED_PATCH | Freq: Once | CUTANEOUS | Status: DC
  Administered 2020-06-30: 1 via TRANSDERMAL
  Filled 2020-06-30: qty 1

## 2020-06-30 NOTE — ED Triage Notes (Signed)
Reported via [facility] due to left hip pain.Fell yesterday afternoon, reports 10/10 continued hip pain with ambulation.

## 2020-06-30 NOTE — ED Notes (Signed)
Pt presents to the ED via EMS from home. Pt had a mechanical fall yesterday afternoon after she missed her chair trying to sit down. Pt c/o L hip pain. Denies NV. States that she has been walking on it but the pain got worse when she was walking on it. Pt is A&Ox4 and NAD

## 2020-06-30 NOTE — Discharge Instructions (Signed)
Your exam is essentially normal and your x-rays did not show any acute fracture of the lumbar spine or hip.  Your symptoms may represent muscle strain and contusion secondary to your fall.  Take the prescription medications as prescribed.  Use the Lidoderm patches as directed.  Apply moist heat and/or ice compresses to your sore areas.  You should follow-up with your primary provider for ongoing symptoms.

## 2020-06-30 NOTE — ED Provider Notes (Signed)
[facility] Emergency Department Provider Note ____________________________________________  Time seen: 1021  I have reviewed the triage vital signs and the nursing notes.  HISTORY  Chief Complaint  Hip Pain   HPI ARRAYA BUCK is a 64 y.o. female with a history of COPD, pulmonary CA, hypertension, and chronic anemia, presents to the ED accompanied by her adult daughter, via EMS from home.  Patient reports a mechanical fall yesterday evening at that she transitions from sit to standing.  She describes getting dizzy, losing her balance.  She apparently fell hitting her left flank and hip on the sofa.  She denies any head injury or loss of consciousness.  Her family witnessed the fall and was able to help the patient back onto the couch.  She rested there for the remainder of the day.  She presents today after awakening with increasing pain and disability.  She describes a burning sensation to the posterior flank with some referral into the left buttocks region.  She denies any significant groin, hip, knee, or leg pain.   Past Medical History:  Diagnosis Date  . Arthritis   . Asthma   . Atherosclerosis of native arteries of extremity with intermittent claudication ([place]) 05/26/2016  . Cancer [facility]) 2012   Right Lung CA  . COPD (chronic obstructive pulmonary disease) ([place])   . Depression   . Diabetes mellitus without complication [facility])    Patient takes Janumet  . Essential hypertension 05/26/2016  . Hypercholesteremia   . Hypertension   . 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 PAD (peripheral artery disease) ([place]) 06/22/2016  . Peripheral vascular disease ([place])   . Personal history of radiation therapy   . Shortness of breath dyspnea    with exertion   . Sleep apnea     Patient Active Problem List   Diagnosis Date Noted  . Pneumothorax after biopsy 05/03/2020  . Pneumothorax on right 05/01/2020  . Chest pain on breathing 04/21/2020  . Pleuritic chest pain  04/11/2020  . Right lower lobe pulmonary nodule 04/02/2020  . Iron deficiency anemia 04/02/2020  . Pica in adults 04/02/2020  . Goals of care, counseling/discussion 03/14/2020  . Acute non-recurrent pansinusitis 01/01/2020  . Pain in left wrist 09/24/2019  . Encounter for general adult medical examination with abnormal findings 06/28/2019  . Elevated total protein 01/22/2019  . Uncontrolled type 2 diabetes mellitus with hyperglycemia ([place]) 05/16/2018  . Chronic obstructive pulmonary disease ([place]) 05/16/2018  . Vitamin D deficiency 05/16/2018  . Flu vaccine need 05/16/2018  . Dysuria 05/16/2018  . Cancer of lower lobe of right lung ([place]) 01/21/2018  . Screening for breast cancer 10/12/2017  . Personal history of tobacco use, presenting hazards to health 02/03/2017  . PAD (peripheral artery disease) ([place]) 06/22/2016  . Essential hypertension 05/26/2016  . Diabetes ([place]) 05/26/2016  . Hyperlipidemia 05/26/2016  . Atherosclerosis of native arteries of extremity with intermittent claudication ([place]) 05/26/2016  . Uterine leiomyoma 04/30/2016  . Elevated CEA 01/31/2016  . Special screening for malignant neoplasms, colon   . Benign neoplasm of sigmoid colon   . Primary lung cancer ([place])   . Malignant neoplasm of upper lobe of right lung [facility])     Past Surgical History:  Procedure Laterality Date  . CESAREAN SECTION     x3  . COLONOSCOPY WITH PROPOFOL N/A 06/25/2015   Procedure: COLONOSCOPY WITH PROPOFOL;  Surgeon: Lucilla Lame, MD;  Location: [facility] ENDOSCOPY;  Service: Endoscopy;  Laterality: N/A;  .  CYST REMOVAL LEG     and on shoulder   . LOWER EXTREMITY ANGIOGRAPHY Left 09/29/2018   Procedure: LOWER EXTREMITY ANGIOGRAPHY;  Surgeon: Algernon Huxley, MD;  Location: [place] CV LAB;  Service: Cardiovascular;  Laterality: Left;  . LUNG BIOPSY  05 15 2013   has lung "spots"  . PERIPHERAL VASCULAR CATHETERIZATION Left 06/01/2016   Procedure: Lower Extremity Angiography;  Surgeon: Algernon Huxley, MD;  Location: [place] CV LAB;  Service: Cardiovascular;  Laterality: Left;  . PERIPHERAL VASCULAR CATHETERIZATION N/A 06/01/2016   Procedure: Abdominal Aortogram w/Lower Extremity;  Surgeon: Algernon Huxley, MD;  Location: [place] CV LAB;  Service: Cardiovascular;  Laterality: N/A;  . PERIPHERAL VASCULAR CATHETERIZATION  06/01/2016   Procedure: Lower Extremity Intervention;  Surgeon: Algernon Huxley, MD;  Location: [place] CV LAB;  Service: Cardiovascular;;  . PERIPHERAL VASCULAR CATHETERIZATION Right 06/08/2016   Procedure: Lower Extremity Angiography;  Surgeon: Algernon Huxley, MD;  Location: [place] CV LAB;  Service: Cardiovascular;  Laterality: Right;  . PERIPHERAL VASCULAR CATHETERIZATION  06/08/2016   Procedure: Lower Extremity Intervention;  Surgeon: Algernon Huxley, MD;  Location: [place] CV LAB;  Service: Cardiovascular;;    Prior to Admission medications   Medication Sig Start Date End Date Taking? Authorizing Provider  Accu-Chek FastClix Lancets MISC Use as directed twice a day diag E11.65 03/15/19   Ronnell Freshwater, NP  albuterol (PROVENTIL) (2.5 MG/3ML) 0.083% nebulizer solution Take 3 mLs (2.5 mg total) by nebulization every 6 (six) hours as needed for wheezing. 10/05/19   Ronnell Freshwater, NP  amLODipine (NORVASC) 10 MG tablet Take 1 tablet (10 mg total) by mouth every morning. 12/25/19   Ronnell Freshwater, NP  aspirin EC 81 MG tablet Take 1 tablet (81 mg total) by mouth daily. 06/01/16   Algernon Huxley, MD  buPROPion (WELLBUTRIN XL) 150 MG 24 hr tablet TAKE 1 TABLET BY MOUTH TWICE DAILY FOR SMOKING CESSATION Patient taking differently: Take 150 mg by mouth in the morning and at bedtime.  05/19/19   Ronnell Freshwater, NP  clopidogrel (PLAVIX) 75 MG tablet Take 1 tablet by mouth once daily 06/24/20   Kris Hartmann, NP  cyclobenzaprine (FLEXERIL) 5 MG tablet Take 1 tablet (5 mg total) by mouth 3 (three) times daily as needed. 06/30/20   Blakelynn Scheeler, Dannielle Karvonen,  PA-C  ferrous sulfate 324 MG TBEC Take 324 mg by mouth.    [provider]  gabapentin (NEURONTIN) 100 MG capsule Take 1 capsule (100 mg total) by mouth 2 (two) times daily. 06/14/20   Ronnell Freshwater, NP  glucose blood (ACCU-CHEK AVIVA PLUS) test strip Use as instructed to check blood sugars three times daily.  E11.65 03/16/19   Ronnell Freshwater, NP  HYDROcodone-acetaminophen (NORCO) 5-325 MG tablet Take 1 tablet by mouth 3 (three) times daily as needed. 06/30/20   Halton Neas, Dannielle Karvonen, PA-C  JANUVIA 25 MG tablet Take 1 tablet by mouth once daily 06/24/20   Ronnell Freshwater, NP  lisinopril-hydrochlorothiazide (ZESTORETIC) 10-12.5 MG tablet Take 1 tablet by mouth every morning. 06/24/20   Ronnell Freshwater, NP  metFORMIN (GLUCOPHAGE-XR) 500 MG 24 hr tablet Take 1 tablet (500 mg total) by mouth daily with breakfast. 06/24/20   Ronnell Freshwater, NP  mometasone-formoterol (DULERA) 200-5 MCG/ACT AERO Inhale 1 puff into the lungs 2 (two) times daily. Please fill as 90 day prescription 01/17/20   Ronnell Freshwater, NP  potassium  chloride (KLOR-CON) 10 MEQ tablet TAKE 1 TABLET BY MOUTH EVERY OTHER DAY FOR  LOW  POTASSIUM Patient taking differently: Take 10 mEq by mouth every other day.  04/10/20   Luiz Ochoa, NP  rOPINIRole (REQUIP) 0.5 MG tablet Take 1 tablet (0.5 mg total) by mouth 2 (two) times daily. 06/24/20   Ronnell Freshwater, NP  rosuvastatin (CRESTOR) 10 MG tablet Take 1 tablet (10 mg total) by mouth at bedtime. 05/05/20   Shahmehdi, Valeria Batman, MD  SPIRIVA HANDIHALER 18 MCG inhalation capsule INHALE THE CONTENTS OF ONE CAPSULE BY MOUTH ONCE DAILY *DO NOT SWALLOW CAPSULE* 05/06/20   [name], MD  VENTOLIN HFA 108 [phone] Base) MCG/ACT inhaler INHALE 2 PUFFS INTO LUNGS EVERY 4 HOURS AS NEEDED FOR WHEEZING OR SHORTNESS OF BREATH 06/14/20   Ronnell Freshwater, NP    Allergies Patient has no known allergies.  Family History  Problem Relation Age of Onset  . Lung cancer Father   .  Diabetes Mother   . Hypercholesterolemia Mother   . Diabetes Sister   . Diabetes Maternal Grandmother   . Diabetes Paternal Grandmother   . Diabetes Sister   . Heart attack Brother   . Coronary artery disease Brother   . Vascular Disease Brother   . Hypertension Sister   . Heart attack Brother     Social History Social History   Tobacco Use  . Smoking status: Former Smoker    Packs/day: 1.00    Years: 37.00    Pack years: 37.00    Types: Cigarettes    Quit date: 02/06/2010    Years since quitting: 10.4  . Smokeless tobacco: Former Systems developer    Types: Snuff  Vaping Use  . Vaping Use: Never used  Substance Use Topics  . Alcohol use: Yes    Alcohol/week: 5.0 standard drinks    Types: 5 Cans of beer per week    Comment: /h x of alcohol abuse -stopped 2012- now drinks 5 beer per w  . Drug use: Not Currently    Types: Marijuana, "Crack" cocaine, Cocaine    Comment: hx of cocaine use- last use 2015; last use sat marijuana6/22/19,     Review of Systems  Constitutional: Negative for fever. Eyes: Negative for visual changes. ENT: Negative for sore throat. Cardiovascular: Negative for chest pain. Respiratory: Negative for shortness of breath. Gastrointestinal: Negative for abdominal pain, vomiting and diarrhea. Genitourinary: Negative for dysuria. Musculoskeletal: Positive for back & hip  pain. Skin: Negative for rash. Neurological: Negative for headaches, focal weakness or numbness. ____________________________________________  PHYSICAL EXAM:  VITAL SIGNS: ED Triage Vitals  Enc Vitals Group     BP 06/30/20 1003 106/65     Pulse Rate 06/30/20 1003 99     Resp 06/30/20 1008 18     Temp 06/30/20 1003 98 F (36.7 C)     Temp Source 06/30/20 1003 Oral     SpO2 06/30/20 1003 98 %     Weight 06/30/20 1002 113 lb (51.3 kg)     Height 06/30/20 1002 4\' 11"  (1.499 m)     Head Circumference --      Peak Flow --      Pain Score 06/30/20 1001 10     Pain Loc --      Pain Edu?  --      Excl. in [place]? --     Constitutional: Alert and oriented. Well appearing and in no distress. Head: Normocephalic and atraumatic. Eyes: Conjunctivae are  normal. Normal extraocular movements Neck: Supple. No thyromegaly. Cardiovascular: Normal rate, regular rhythm. Normal distal pulses. Respiratory: Normal respiratory effort. No wheezes/rales/rhonchi. Gastrointestinal: Soft and nontender. No distention,, rebound, guarding, or rigidity.  Normal bowel sounds appreciated.  No CVA tenderness elicited. Musculoskeletal: Left hip without obvious deformity, fixed internal rotation, or shortening.  Patient normal active range of motion to the hips bilaterally.  No tenderness to palpation over the left medial thigh and groin, or the lateral hip trochanter.  Normal spinal alignment without midline tenderness, spasm, deformity, step-off.  Patient tender to palpation to the left lumbar sacral region.  Nontender with normal range of motion in all extremities.  Neurologic: Antalgic gait without ataxia. Normal speech and language. No gross focal neurologic deficits are appreciated. Skin:  Skin is warm, dry and intact. No rash noted. Psychiatric: Mood and affect are normal. Patient exhibits appropriate insight and judgment. ____________________________________________   LABS (pertinent positives/negatives) Labs Reviewed  URINALYSIS, COMPLETE (UACMP) WITH MICROSCOPIC - Abnormal; Notable for the following components:      Result Value   Color, Urine YELLOW (*)    APPearance HAZY (*)    Ketones, ur 5 (*)    All other components within normal limits  ____________________________________________   RADIOLOGY  DG Lumbar Spine IMPRESSION: Mild degenerative changes in the lumbar spine. No acute findings.   DG Left Hip w/ Pelvis  IMPRESSION: No acute osseous abnormality.  If persistent concern for nondisplaced hip or pelvic fracture, recommend dedicated pelvic  MRI. ____________________________________________  PROCEDURES  Norco 5-325 mg PO  Procedures ____________________________________________  INITIAL IMPRESSION / ASSESSMENT AND PLAN / ED COURSE  Geriatric patient with ED evaluation of injury sustained following a mechanical fall yesterday.  Patient's exam is overall benign reassuring at this time.  No radiologic evidence of fracture or dislocation.  UA also does not reveal any acute hematuria concerning for any kidney contusion.  She is reporting improvement of her symptoms after medication ministration in the ED.  She will be discharged with prescriptions for muscle relaxant and pain medicine to take as needed.  She will follow with primary provider return to the ED as needed.  SAMEERAH NACHTIGAL was evaluated in Emergency Department on 06/30/2020 for the symptoms described in the history of present illness. She was evaluated in the context of the global COVID-19 pandemic, which necessitated consideration that the patient might be at risk for infection with the SARS-CoV-2 virus that causes COVID-19. Institutional protocols and algorithms that pertain to the evaluation of patients at risk for COVID-19 are in a state of rapid change based on information released by regulatory bodies including the CDC and federal and state organizations. These policies and algorithms were followed during the patient's care in the ED.  I reviewed the patient's prescription history over the last 12 months in the multi-state controlled substances database(s) that includes [place], [place], [place], [place], [place], [place], [place], [place], [place], [place], [place], [place], [place], and [place].  Results were notable for no current RX.  ____________________________________________  FINAL CLINICAL IMPRESSION(S) / ED DIAGNOSES  Final diagnoses:  Fall, initial encounter  Acute left-sided low back pain without sciatica       Carmie End, Dannielle Karvonen, PA-C 06/30/20 1905    Naaman Plummer, MD 07/03/20 [phone]

## 2020-06-30 NOTE — ED Notes (Signed)
Informed family and pt that the provider has ordered a urine sample and to ring call bell when she is able to give one.

## 2020-07-02 ENCOUNTER — Ambulatory Visit: Payer: BLUE CROSS/BLUE SHIELD | Admitting: Internal Medicine

## 2020-07-03 ENCOUNTER — Encounter: Payer: BLUE CROSS/BLUE SHIELD | Admitting: Hospice and Palliative Medicine

## 2020-07-04 ENCOUNTER — Other Ambulatory Visit: Payer: Self-pay

## 2020-07-04 MED ORDER — BUPROPION HCL ER (XL) 150 MG PO TB24
ORAL_TABLET | ORAL | 6 refills | Status: DC
Start: 2020-07-04 — End: 2020-10-30

## 2020-07-18 ENCOUNTER — Encounter: Payer: Self-pay | Admitting: Radiation Oncology

## 2020-07-19 ENCOUNTER — Encounter: Payer: Self-pay | Admitting: Radiation Oncology

## 2020-07-19 ENCOUNTER — Ambulatory Visit
Admission: RE | Admit: 2020-07-19 | Discharge: 2020-07-19 | Disposition: A | Discharge status: Home / Self Care | Payer: BLUE CROSS/BLUE SHIELD | Source: Ambulatory Visit | Attending: Radiation Oncology | Admitting: Radiation Oncology

## 2020-07-19 VITALS — BP 147/92 | HR 116 | Temp 97.0°F | Wt 102.6 lb

## 2020-07-19 DIAGNOSIS — Z923 Personal history of irradiation: Secondary | ICD-10-CM | POA: Insufficient documentation

## 2020-07-19 DIAGNOSIS — C3431 Malignant neoplasm of lower lobe, right bronchus or lung: Secondary | ICD-10-CM | POA: Insufficient documentation

## 2020-07-19 NOTE — Progress Notes (Signed)
Radiation Oncology Follow up Note  Name: Angela Jensen   Date:   07/19/2020 MRN:  [id number] DOB: 30-Jun-1956    This 64 y.o. female presents to the clinic today for 1 month follow-up status post SBRT to her right lower lobe for multinodular adenocarcinoma and patient previously treated to her right upper lobe with SBRT.  REFERRING PROVIDER: Ronnell Freshwater, NP  HPI: Patient is a 64 year old female now at 1 month having been treated to her right upper lobe with SBRT for biopsy positive adenocarcinoma.  She also been previously treated to her right upper lobe with SBRT.  Seen today in routine follow-up she is doing well she specifically [place] cough hemoptysis or chest tightness..  She has a CT scan scheduled for January.  COMPLICATIONS OF TREATMENT: none  FOLLOW UP COMPLIANCE: keeps appointments   PHYSICAL EXAM:  BP (!) 147/92   Pulse (!) 116   Temp (!) 97 F (36.1 C) (Tympanic)   Wt 102 lb 9.6 oz (46.5 kg)   BMI 20.72 kg/m  Well-developed well-nourished patient in NAD. HEENT reveals PERLA, EOMI, discs not visualized.  Oral cavity is clear. No oral mucosal lesions are identified. Neck is clear without evidence of cervical or supraclavicular adenopathy. Lungs are clear to A&P. Cardiac examination is essentially unremarkable with regular rate and rhythm without murmur rub or thrill. Abdomen is benign with no organomegaly or masses noted. Motor sensory and DTR levels are equal and symmetric in the upper and lower extremities. Cranial nerves II through XII are grossly intact. Proprioception is intact. No peripheral adenopathy or edema is identified. No motor or sensory levels are noted. Crude visual fields are within normal range.  RADIOLOGY RESULTS: No current films to review  PLAN: Present time patient is doing well with extremely low side effect profile from her SBRT x2.  And pleased with her overall progress.  I have asked to see her back in 3 to 4 months for follow-up.  She already has a  CT scan in January which I will review when it becomes available.  Patient knows to call with any concerns.  I would like to take this opportunity to thank you for allowing me to participate in the care of your patient.Noreene Filbert, MD

## 2020-07-22 ENCOUNTER — Other Ambulatory Visit: Payer: Self-pay

## 2020-07-22 ENCOUNTER — Inpatient Hospital Stay: Payer: BLUE CROSS/BLUE SHIELD | Attending: Hematology and Oncology

## 2020-07-22 DIAGNOSIS — C3431 Malignant neoplasm of lower lobe, right bronchus or lung: Secondary | ICD-10-CM | POA: Insufficient documentation

## 2020-07-22 DIAGNOSIS — C3411 Malignant neoplasm of upper lobe, right bronchus or lung: Secondary | ICD-10-CM | POA: Insufficient documentation

## 2020-07-22 LAB — CBC
HCT: 36.5 % (ref 36.0–46.0)
Hemoglobin: 11.5 g/dL — ABNORMAL LOW (ref 12.0–15.0)
MCH: 28.3 pg (ref 26.0–34.0)
MCHC: 31.5 g/dL (ref 30.0–36.0)
MCV: 89.7 fL (ref 80.0–100.0)
Platelets: 323 10*3/uL (ref 150–400)
RBC: 4.07 MIL/uL (ref 3.87–5.11)
RDW: 15 % (ref 11.5–15.5)
WBC: 4.6 10*3/uL (ref 4.0–10.5)
nRBC: 0 % (ref 0.0–0.2)

## 2020-07-23 ENCOUNTER — Ambulatory Visit (INDEPENDENT_AMBULATORY_CARE_PROVIDER_SITE_OTHER): Payer: BLUE CROSS/BLUE SHIELD | Admitting: Gastroenterology

## 2020-07-23 ENCOUNTER — Other Ambulatory Visit: Payer: Self-pay

## 2020-07-23 ENCOUNTER — Encounter: Payer: Self-pay | Admitting: Gastroenterology

## 2020-07-23 VITALS — BP 115/82 | HR 97 | Temp 98.2°F | Ht 59.0 in | Wt 105.0 lb

## 2020-07-23 DIAGNOSIS — D500 Iron deficiency anemia secondary to blood loss (chronic): Secondary | ICD-10-CM | POA: Diagnosis not present

## 2020-07-23 MED ORDER — PEG 3350-KCL-NA BICARB-NACL 420 G PO SOLR: ORAL | 0 refills | Status: DC

## 2020-07-23 NOTE — Progress Notes (Signed)
Gastroenterology Consultation  Referring Provider:     Ronnell Freshwater, NP Primary Care Physician:  Ronnell Freshwater, NP Primary Gastroenterologist:  Dr. Allen Norris     Reason for Consultation:     Iron deficiency anemia        HPI:   Angela Jensen is a 64 y.o. y/o female referred for consultation & management of iron deficiency anemia by Dr. Ronnell Freshwater, NP.  This patient comes in today with a history of iron deficiency anemia.  The patient had a colonoscopy with a polyp found in 2016 by me.  The patient has been followed by oncology for stage I lung cancer.  The patient has been on iron supplementation.  The patient was treated with radiation and chemotherapy for her lung lesion and states that she has responded well.  She denies any black stools bloody stools bloody urine abdominal pain or unexplained weight loss  Past Medical History:  Diagnosis Date  . Arthritis   . Asthma   . Atherosclerosis of native arteries of extremity with intermittent claudication ([place]) 05/26/2016  . Cancer [facility]) 2012   Right Lung CA  . COPD (chronic obstructive pulmonary disease) ([place])   . Depression   . Diabetes mellitus without complication [facility])    Patient takes Janumet  . Essential hypertension 05/26/2016  . Hypercholesteremia   . Hypertension   . 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 PAD (peripheral artery disease) ([place]) 06/22/2016  . Peripheral vascular disease ([place])   . Personal history of radiation therapy   . Shortness of breath dyspnea    with exertion   . Sleep apnea     Past Surgical History:  Procedure Laterality Date  . CESAREAN SECTION     x3  . COLONOSCOPY WITH PROPOFOL N/A 06/25/2015   Procedure: COLONOSCOPY WITH PROPOFOL;  Surgeon: Lucilla Lame, MD;  Location: [facility] ENDOSCOPY;  Service: Endoscopy;  Laterality: N/A;  . CYST REMOVAL LEG     and on shoulder   . LOWER EXTREMITY ANGIOGRAPHY Left 09/29/2018   Procedure: LOWER EXTREMITY ANGIOGRAPHY;  Surgeon: Algernon Huxley, MD;   Location: [place] CV LAB;  Service: Cardiovascular;  Laterality: Left;  . LUNG BIOPSY  05 15 2013   has lung "spots"  . PERIPHERAL VASCULAR CATHETERIZATION Left 06/01/2016   Procedure: Lower Extremity Angiography;  Surgeon: Algernon Huxley, MD;  Location: [place] CV LAB;  Service: Cardiovascular;  Laterality: Left;  . PERIPHERAL VASCULAR CATHETERIZATION N/A 06/01/2016   Procedure: Abdominal Aortogram w/Lower Extremity;  Surgeon: Algernon Huxley, MD;  Location: [place] CV LAB;  Service: Cardiovascular;  Laterality: N/A;  . PERIPHERAL VASCULAR CATHETERIZATION  06/01/2016   Procedure: Lower Extremity Intervention;  Surgeon: Algernon Huxley, MD;  Location: [place] CV LAB;  Service: Cardiovascular;;  . PERIPHERAL VASCULAR CATHETERIZATION Right 06/08/2016   Procedure: Lower Extremity Angiography;  Surgeon: Algernon Huxley, MD;  Location: [place] CV LAB;  Service: Cardiovascular;  Laterality: Right;  . PERIPHERAL VASCULAR CATHETERIZATION  06/08/2016   Procedure: Lower Extremity Intervention;  Surgeon: Algernon Huxley, MD;  Location: [place] CV LAB;  Service: Cardiovascular;;    Prior to Admission medications   Medication Sig Start Date End Date Taking? Authorizing Provider  Accu-Chek FastClix Lancets MISC Use as directed twice a day diag E11.65 03/15/19   Ronnell Freshwater, NP  albuterol (PROVENTIL) (2.5 MG/3ML) 0.083% nebulizer solution Take 3 mLs (2.5 mg total) by nebulization every 6 (six)  hours as needed for wheezing. 10/05/19   Ronnell Freshwater, NP  amLODipine (NORVASC) 10 MG tablet Take 1 tablet (10 mg total) by mouth every morning. 12/25/19   Ronnell Freshwater, NP  aspirin EC 81 MG tablet Take 1 tablet (81 mg total) by mouth daily. 06/01/16   Algernon Huxley, MD  buPROPion (WELLBUTRIN XL) 150 MG 24 hr tablet TAKE 1 TABLET BY MOUTH TWICE DAILY FOR SMOKING CESSATION 07/04/20   Ronnell Freshwater, NP  clopidogrel (PLAVIX) 75 MG tablet Take 1 tablet by mouth once daily 06/24/20   Kris Hartmann, NP  cyclobenzaprine (FLEXERIL) 5 MG tablet Take 1 tablet (5 mg total) by mouth 3 (three) times daily as needed. 06/30/20   Menshew, Dannielle Karvonen, PA-C  ferrous sulfate 324 MG TBEC Take 324 mg by mouth.    [provider]  gabapentin (NEURONTIN) 100 MG capsule Take 1 capsule (100 mg total) by mouth 2 (two) times daily. 06/14/20   Ronnell Freshwater, NP  glucose blood (ACCU-CHEK AVIVA PLUS) test strip Use as instructed to check blood sugars three times daily.  E11.65 03/16/19   Ronnell Freshwater, NP  JANUVIA 25 MG tablet Take 1 tablet by mouth once daily 06/24/20   Ronnell Freshwater, NP  lisinopril-hydrochlorothiazide (ZESTORETIC) 10-12.5 MG tablet Take 1 tablet by mouth every morning. 06/24/20   Ronnell Freshwater, NP  meloxicam (MOBIC) 7.5 MG tablet Take 7.5 mg by mouth 2 (two) times daily. 06/27/20   [provider]  metFORMIN (GLUCOPHAGE-XR) 500 MG 24 hr tablet Take 1 tablet (500 mg total) by mouth daily with breakfast. 06/24/20   Ronnell Freshwater, NP  mometasone-formoterol (DULERA) 200-5 MCG/ACT AERO Inhale 1 puff into the lungs 2 (two) times daily. Please fill as 90 day prescription 01/17/20   Ronnell Freshwater, NP  potassium chloride (KLOR-CON) 10 MEQ tablet TAKE 1 TABLET BY MOUTH EVERY OTHER DAY FOR  LOW  POTASSIUM Patient taking differently: Take 10 mEq by mouth every other day.  04/10/20   Luiz Ochoa, NP  rOPINIRole (REQUIP) 0.5 MG tablet Take 1 tablet (0.5 mg total) by mouth 2 (two) times daily. 06/24/20   Ronnell Freshwater, NP  rosuvastatin (CRESTOR) 10 MG tablet Take 1 tablet (10 mg total) by mouth at bedtime. 05/05/20   Shahmehdi, Valeria Batman, MD  SPIRIVA HANDIHALER 18 MCG inhalation capsule INHALE THE CONTENTS OF ONE CAPSULE BY MOUTH ONCE DAILY *DO NOT SWALLOW CAPSULE* 05/06/20   [name], MD  VENTOLIN HFA 108 [phone] Base) MCG/ACT inhaler INHALE 2 PUFFS INTO LUNGS EVERY 4 HOURS AS NEEDED FOR WHEEZING OR SHORTNESS OF BREATH 06/14/20   Ronnell Freshwater, NP     Family History  Problem Relation Age of Onset  . Lung cancer Father   . Diabetes Mother   . Hypercholesterolemia Mother   . Diabetes Sister   . Diabetes Maternal Grandmother   . Diabetes Paternal Grandmother   . Diabetes Sister   . Heart attack Brother   . Coronary artery disease Brother   . Vascular Disease Brother   . Hypertension Sister   . Heart attack Brother      Social History   Tobacco Use  . Smoking status: Former Smoker    Packs/day: 1.00    Years: 37.00    Pack years: 37.00    Types: Cigarettes    Quit date: 02/06/2010    Years since quitting: 10.4  . Smokeless tobacco: Former Systems developer  Types: Snuff  Vaping Use  . Vaping Use: Never used  Substance Use Topics  . Alcohol use: Yes    Alcohol/week: 5.0 standard drinks    Types: 5 Cans of beer per week    Comment: /h x of alcohol abuse -stopped 2012- now drinks 5 beer per w  . Drug use: Not Currently    Types: Marijuana, "Crack" cocaine, Cocaine    Comment: hx of cocaine use- last use 2015; last use sat marijuana6/22/19,     Allergies as of 07/23/2020  . (No Known Allergies)    Review of Systems:    All systems reviewed and negative except where noted in HPI.   Physical Exam:  There were no vitals taken for this visit. No LMP recorded. Patient is postmenopausal. General:   Alert,  Well-developed, well-nourished, pleasant and cooperative in NAD Head:  Normocephalic and atraumatic. Eyes:  Sclera clear, no icterus.   Conjunctiva pink. Ears:  Normal auditory acuity. Neck:  Supple; no masses or thyromegaly. Lungs:  Respirations even and unlabored.  Clear throughout to auscultation.   No wheezes, crackles, or rhonchi. No acute distress. Heart:  Regular rate and rhythm; no murmurs, clicks, rubs, or gallops. Abdomen:  Normal bowel sounds.  No bruits.  Soft, non-tender and non-distended without masses, hepatosplenomegaly or hernias noted.  No guarding or rebound tenderness.  Negative Carnett sign.   Rectal:   Deferred.  Pulses:  Normal pulses noted. Extremities:  No clubbing or edema.  No cyanosis. Neurologic:  Alert and oriented x3;  grossly normal neurologically. Skin:  Intact without significant lesions or rashes.  No jaundice. Lymph Nodes:  No significant cervical adenopathy. Psych:  Alert and cooperative. Normal mood and affect.  Imaging Studies: DG Lumbar Spine 2-3 Views  Result Date: 06/30/2020 CLINICAL DATA:  Pain in LEFT hip EXAM: LUMBAR SPINE - 2-3 VIEW COMPARISON:  None. FINDINGS: There are five non-rib bearing lumbar-type vertebral bodies. There is normal alignment. There is no evidence for acute fracture or subluxation. Mild multilevel endplate proliferative changes with mild intervertebral disc space height loss most pronounced at L1-2 and L4-5. Vascular calcifications. LEFT leg stent graft. IMPRESSION: Mild degenerative changes in the lumbar spine. No acute findings. Electronically Signed   By: Valentino Saxon MD   On: 06/30/2020 11:24   DG Hip Unilat W or Wo Pelvis 2-3 Views Left  Result Date: 06/30/2020 CLINICAL DATA:  Pain in LEFT posterior hip. EXAM: DG HIP (WITH OR WITHOUT PELVIS) 2-3V LEFT COMPARISON:  None. FINDINGS: Osteopenia. No acute fracture or dislocation. Mild degenerative changes of the LEFT hip with osteophyte formation. No area of erosion or osseous destruction. No unexpected radiopaque foreign body. Limited assessment of the sacrum secondary to overlying stool. Pelvic phleboliths. LEFT leg stent graft. IMPRESSION: No acute osseous abnormality. If persistent concern for nondisplaced hip or pelvic fracture, recommend dedicated pelvic MRI. Electronically Signed   By: Valentino Saxon MD   On: 06/30/2020 11:21    Assessment and Plan:   Angela Jensen is a 64 y.o. y/o female who comes in today with iron deficiency anemia.  The patient will be set up for an EGD and colonoscopy.  The patient had a colonoscopy in 2016 with a polyp at that time.  If the EGD and colonoscopy  do not show a source of the iron deficiency anemia then the patient may need a capsule endoscopy at that time.  The patient has been explained the plan and agrees with it    Lucilla Lame,  MD. Marval Regal    Note: This dictation was prepared with Dragon dictation along with smaller phrase technology. Any transcriptional errors that result from this process are unintentional.

## 2020-07-24 ENCOUNTER — Other Ambulatory Visit: Payer: Self-pay

## 2020-07-24 ENCOUNTER — Telehealth: Payer: Self-pay

## 2020-07-24 ENCOUNTER — Other Ambulatory Visit
Admission: RE | Admit: 2020-07-24 | Discharge: 2020-07-24 | Disposition: A | Discharge status: Home / Self Care | Payer: BLUE CROSS/BLUE SHIELD | Source: Ambulatory Visit | Attending: Gastroenterology | Admitting: Gastroenterology

## 2020-07-24 ENCOUNTER — Encounter: Payer: Self-pay | Admitting: Gastroenterology

## 2020-07-24 DIAGNOSIS — Z20822 Contact with and (suspected) exposure to covid-19: Secondary | ICD-10-CM | POA: Insufficient documentation

## 2020-07-24 DIAGNOSIS — Z87891 Personal history of nicotine dependence: Secondary | ICD-10-CM | POA: Diagnosis not present

## 2020-07-24 DIAGNOSIS — K640 First degree hemorrhoids: Secondary | ICD-10-CM | POA: Diagnosis not present

## 2020-07-24 DIAGNOSIS — Z7984 Long term (current) use of oral hypoglycemic drugs: Secondary | ICD-10-CM | POA: Diagnosis not present

## 2020-07-24 DIAGNOSIS — D509 Iron deficiency anemia, unspecified: Secondary | ICD-10-CM | POA: Diagnosis not present

## 2020-07-24 DIAGNOSIS — Z7982 Long term (current) use of aspirin: Secondary | ICD-10-CM | POA: Diagnosis not present

## 2020-07-24 DIAGNOSIS — Z7951 Long term (current) use of inhaled steroids: Secondary | ICD-10-CM | POA: Diagnosis not present

## 2020-07-24 DIAGNOSIS — Z01812 Encounter for preprocedural laboratory examination: Secondary | ICD-10-CM | POA: Insufficient documentation

## 2020-07-24 DIAGNOSIS — Z79899 Other long term (current) drug therapy: Secondary | ICD-10-CM | POA: Diagnosis not present

## 2020-07-24 DIAGNOSIS — K3189 Other diseases of stomach and duodenum: Secondary | ICD-10-CM | POA: Diagnosis not present

## 2020-07-24 DIAGNOSIS — Z791 Long term (current) use of non-steroidal anti-inflammatories (NSAID): Secondary | ICD-10-CM | POA: Diagnosis not present

## 2020-07-24 DIAGNOSIS — Z7902 Long term (current) use of antithrombotics/antiplatelets: Secondary | ICD-10-CM | POA: Diagnosis not present

## 2020-07-24 DIAGNOSIS — K5730 Diverticulosis of large intestine without perforation or abscess without bleeding: Secondary | ICD-10-CM | POA: Diagnosis not present

## 2020-07-24 NOTE — Telephone Encounter (Signed)
Blood thinner information request faxed back to [place] [facility] Attention to [facility]. Patient is to stop Plavix 5 days prior to procedure and to restart 24 hours after. Copy placed in scan.

## 2020-07-25 LAB — SARS CORONAVIRUS 2 (TAT 6-24 HRS): SARS Coronavirus 2: NEGATIVE

## 2020-07-25 NOTE — Discharge Instructions (Signed)
General Anesthesia, Adult, Care After This sheet gives you information about how to care for yourself after your procedure. Your health care provider may also give you more specific instructions. If you have problems or questions, contact your health care provider. What can I expect after the procedure? After the procedure, the following side effects are common:  Pain or discomfort at the IV site.  Nausea.  Vomiting.  Sore throat.  Trouble concentrating.  Feeling cold or chills.  Weak or tired.  Sleepiness and fatigue.  Soreness and body aches. These side effects can affect parts of the body that were not involved in surgery. Follow these instructions at home:  For at least 24 hours after the procedure:  Have a responsible adult stay with you. It is important to have someone help care for you until you are awake and alert.  Rest as needed.  Do not: ? Participate in activities in which you could fall or become injured. ? Drive. ? Use heavy machinery. ? Drink alcohol. ? Take sleeping pills or medicines that cause drowsiness. ? Make important decisions or sign legal documents. ? Take care of children on your own. Eating and drinking  Follow any instructions from your health care provider about eating or drinking restrictions.  When you feel hungry, start by eating small amounts of foods that are soft and easy to digest (bland), such as toast. Gradually return to your regular diet.  Drink enough fluid to keep your urine pale yellow.  If you vomit, rehydrate by drinking water, juice, or clear broth. General instructions  If you have sleep apnea, surgery and certain medicines can increase your risk for breathing problems. Follow instructions from your health care provider about wearing your sleep device: ? Anytime you are sleeping, including during daytime naps. ? While taking prescription pain medicines, sleeping medicines, or medicines that make you drowsy.  Return to  your normal activities as told by your health care provider. Ask your health care provider what activities are safe for you.  Take over-the-counter and prescription medicines only as told by your health care provider.  If you smoke, do not smoke without supervision.  Keep all follow-up visits as told by your health care provider. This is important. Contact a health care provider if:  You have nausea or vomiting that does not get better with medicine.  You cannot eat or drink without vomiting.  You have pain that does not get better with medicine.  You are unable to pass urine.  You develop a skin rash.  You have a fever.  You have redness around your IV site that gets worse. Get help right away if:  You have difficulty breathing.  You have chest pain.  You have blood in your urine or stool, or you vomit blood. Summary  After the procedure, it is common to have a sore throat or nausea. It is also common to feel tired.  Have a responsible adult stay with you for the first 24 hours after general anesthesia. It is important to have someone help care for you until you are awake and alert.  When you feel hungry, start by eating small amounts of foods that are soft and easy to digest (bland), such as toast. Gradually return to your regular diet.  Drink enough fluid to keep your urine pale yellow.  Return to your normal activities as told by your health care provider. Ask your health care provider what activities are safe for you. This information is not   intended to replace advice given to you by your health care provider. Make sure you discuss any questions you have with your health care provider. Document Revised: 08/06/2017 Document Reviewed: 03/19/2017 Elsevier Patient Education  2020 Elsevier Inc.  

## 2020-07-26 ENCOUNTER — Encounter: Payer: Self-pay | Admitting: Gastroenterology

## 2020-07-26 ENCOUNTER — Ambulatory Visit: Payer: BLUE CROSS/BLUE SHIELD | Admitting: Anesthesiology

## 2020-07-26 ENCOUNTER — Ambulatory Visit
Admission: RE | Admit: 2020-07-26 | Discharge: 2020-07-26 | Disposition: A | Discharge status: Home / Self Care | Payer: BLUE CROSS/BLUE SHIELD | Attending: Gastroenterology | Admitting: Gastroenterology

## 2020-07-26 ENCOUNTER — Other Ambulatory Visit: Payer: Self-pay

## 2020-07-26 ENCOUNTER — Encounter
Admission: RE | Disposition: A | Discharge status: Home / Self Care | Payer: Self-pay | Source: Home / Self Care | Attending: Gastroenterology

## 2020-07-26 DIAGNOSIS — Z79899 Other long term (current) drug therapy: Secondary | ICD-10-CM | POA: Insufficient documentation

## 2020-07-26 DIAGNOSIS — D500 Iron deficiency anemia secondary to blood loss (chronic): Secondary | ICD-10-CM

## 2020-07-26 DIAGNOSIS — D509 Iron deficiency anemia, unspecified: Secondary | ICD-10-CM | POA: Insufficient documentation

## 2020-07-26 DIAGNOSIS — Z7984 Long term (current) use of oral hypoglycemic drugs: Secondary | ICD-10-CM | POA: Insufficient documentation

## 2020-07-26 DIAGNOSIS — K640 First degree hemorrhoids: Secondary | ICD-10-CM | POA: Insufficient documentation

## 2020-07-26 DIAGNOSIS — Z7951 Long term (current) use of inhaled steroids: Secondary | ICD-10-CM | POA: Insufficient documentation

## 2020-07-26 DIAGNOSIS — K5730 Diverticulosis of large intestine without perforation or abscess without bleeding: Secondary | ICD-10-CM | POA: Insufficient documentation

## 2020-07-26 DIAGNOSIS — Z87891 Personal history of nicotine dependence: Secondary | ICD-10-CM | POA: Insufficient documentation

## 2020-07-26 DIAGNOSIS — Z7982 Long term (current) use of aspirin: Secondary | ICD-10-CM | POA: Insufficient documentation

## 2020-07-26 DIAGNOSIS — K3189 Other diseases of stomach and duodenum: Secondary | ICD-10-CM | POA: Insufficient documentation

## 2020-07-26 DIAGNOSIS — Z20822 Contact with and (suspected) exposure to covid-19: Secondary | ICD-10-CM | POA: Insufficient documentation

## 2020-07-26 DIAGNOSIS — Z791 Long term (current) use of non-steroidal anti-inflammatories (NSAID): Secondary | ICD-10-CM | POA: Insufficient documentation

## 2020-07-26 DIAGNOSIS — Z7902 Long term (current) use of antithrombotics/antiplatelets: Secondary | ICD-10-CM | POA: Insufficient documentation

## 2020-07-26 HISTORY — PX: ESOPHAGOGASTRODUODENOSCOPY (EGD) WITH PROPOFOL: SHX5813

## 2020-07-26 HISTORY — DX: Presence of dental prosthetic device (complete) (partial): Z97.2

## 2020-07-26 HISTORY — PX: COLONOSCOPY WITH PROPOFOL: SHX5780

## 2020-07-26 LAB — GLUCOSE, CAPILLARY
Glucose-Capillary: 117 mg/dL — ABNORMAL HIGH (ref 70–99)
Glucose-Capillary: 111 mg/dL — ABNORMAL HIGH (ref 70–99)

## 2020-07-26 SURGERY — COLONOSCOPY WITH PROPOFOL
Anesthesia: General | Site: Throat

## 2020-07-26 MED ORDER — STERILE WATER FOR IRRIGATION IR SOLN
Status: DC | PRN
  Administered 2020-07-26: .05 mL

## 2020-07-26 MED ORDER — LACTATED RINGERS IV SOLN: INTRAVENOUS | Status: DC

## 2020-07-26 MED ORDER — PROPOFOL 10 MG/ML IV BOLUS
INTRAVENOUS | Status: DC | PRN
  Administered 2020-07-26: 50 mg via INTRAVENOUS
  Administered 2020-07-26: 100 mg via INTRAVENOUS

## 2020-07-26 MED ORDER — LIDOCAINE HCL (CARDIAC) PF 100 MG/5ML IV SOSY
PREFILLED_SYRINGE | INTRAVENOUS | Status: DC | PRN
  Administered 2020-07-26: 80 mg via INTRAVENOUS

## 2020-07-26 MED ORDER — GLYCOPYRROLATE 0.2 MG/ML IJ SOLN
INTRAMUSCULAR | Status: DC | PRN
  Administered 2020-07-26: .2 mg via INTRAVENOUS

## 2020-07-26 SURGICAL SUPPLY — 7 items
BLOCK BITE 60FR ADLT L/F GRN (MISCELLANEOUS) ×3 IMPLANT
GOWN CVR UNV OPN BCK APRN NK (MISCELLANEOUS) ×4 IMPLANT
GOWN ISOL THUMB LOOP REG UNIV (MISCELLANEOUS) ×6
KIT PRC NS LF DISP ENDO (KITS) ×2 IMPLANT
KIT PROCEDURE OLYMPUS (KITS) ×3
MANIFOLD NEPTUNE II (INSTRUMENTS) ×3 IMPLANT
WATER STERILE IRR 250ML POUR (IV SOLUTION) ×3 IMPLANT

## 2020-07-26 NOTE — Op Note (Signed)
[facility] Gastroenterology Patient Name: Angela Jensen Procedure Date: 07/26/2020 7:29 AM MRN: [id number] Account #: [id number] Date of Birth: 08-07-1956 Admit Type: Outpatient Age: 64 Room: [facility] OR ROOM 01 Gender: Female Note Status: Finalized Procedure:             Colonoscopy Indications:           Iron deficiency anemia Providers:             Lucilla Lame MD, MD Medicines:             Propofol per Anesthesia Complications:         No immediate complications. Procedure:             Pre-Anesthesia Assessment:                        - Prior to the procedure, a History and Physical was                         performed, and patient medications and allergies were                         reviewed. The patient's tolerance of previous                         anesthesia was also reviewed. The risks and benefits                         of the procedure and the sedation options and risks                         were discussed with the patient. All questions were                         answered, and informed consent was obtained. Prior                         Anticoagulants: The patient has taken no previous                         anticoagulant or antiplatelet agents. ASA Grade                         Assessment: II - A patient with mild systemic disease.                         After reviewing the risks and benefits, the patient                         was deemed in satisfactory condition to undergo the                         procedure.                        After obtaining informed consent, the colonoscope was                         passed under direct vision. Throughout the procedure,  the patient's blood pressure, pulse, and oxygen                         saturations were monitored continuously. The                         Colonoscope was introduced through the anus and                         advanced to the the cecum, identified by  appendiceal                         orifice and ileocecal valve. The colonoscopy was                         performed without difficulty. The patient tolerated                         the procedure well. The quality of the bowel                         preparation was good. Findings:      The perianal and digital rectal examinations were normal.      Multiple small-mouthed diverticula were found in the entire colon.      Non-bleeding internal hemorrhoids were found during retroflexion. The       hemorrhoids were Grade I (internal hemorrhoids that do not prolapse). Impression:            - Diverticulosis in the entire examined colon.                        - Non-bleeding internal hemorrhoids.                        - No specimens collected. Recommendation:        - Discharge patient to home.                        - Resume previous diet.                        - Continue present medications.                        - To visualize the small bowel, perform video capsule                         endoscopy at appointment to be scheduled. Procedure Code(s):     --- Professional ---                        [phone], Colonoscopy, flexible; diagnostic, including                         collection of specimen(s) by brushing or washing, when                         performed (separate procedure) Diagnosis Code(s):     --- Professional ---  D50.9, Iron deficiency anemia, unspecified CPT copyright 2019 American Medical Association. All rights reserved. The codes documented in this report are preliminary and upon coder review may  be revised to meet current compliance requirements. Lucilla Lame MD, MD 07/26/2020 8:14:00 AM This report has been signed electronically. Number of Addenda: 0 Note Initiated On: 07/26/2020 7:29 AM Scope Withdrawal Time: 0 hours 7 minutes 58 seconds  Total Procedure Duration: 0 hours 13 minutes 59 seconds  Estimated Blood Loss:  Estimated blood loss:  none.      [facility]

## 2020-07-26 NOTE — Op Note (Signed)
[facility] Gastroenterology Patient Name: Angela Jensen Procedure Date: 07/26/2020 7:34 AM MRN: [id number] Account #: [id number] Date of Birth: 12-30-1955 Admit Type: Outpatient Age: 64 Room: [facility] OR ROOM 01 Gender: Female Note Status: Finalized Procedure:             Upper GI endoscopy Indications:           Iron deficiency anemia Providers:             Lucilla Lame MD, MD Referring MD:          [name], MD (Referring MD) Medicines:             Propofol per Anesthesia Complications:         No immediate complications. Procedure:             Pre-Anesthesia Assessment:                        - Prior to the procedure, a History and Physical was                         performed, and patient medications and allergies were                         reviewed. The patient's tolerance of previous                         anesthesia was also reviewed. The risks and benefits                         of the procedure and the sedation options and risks                         were discussed with the patient. All questions were                         answered, and informed consent was obtained. Prior                         Anticoagulants: The patient has taken no previous                         anticoagulant or antiplatelet agents. ASA Grade                         Assessment: II - A patient with mild systemic disease.                         After reviewing the risks and benefits, the patient                         was deemed in satisfactory condition to undergo the                         procedure.                        After obtaining informed consent, the endoscope was  passed under direct vision. Throughout the procedure,                         the patient's blood pressure, pulse, and oxygen                         saturations were monitored continuously. The was                         introduced through the mouth, and advanced to the                          second part of duodenum. The upper GI endoscopy was                         accomplished without difficulty. The patient tolerated                         the procedure well. Findings:      The examined esophagus was normal.      A few localized erosions with no bleeding and no stigmata of recent       bleeding were found in the gastric antrum.      The examined duodenum was normal. Impression:            - Normal esophagus.                        - Erosive gastropathy with no bleeding and no stigmata                         of recent bleeding.                        - Normal examined duodenum.                        - No specimens collected. Recommendation:        - Discharge patient to home.                        - Resume previous diet.                        - Continue present medications.                        - Use a proton pump inhibitor PO daily. Procedure Code(s):     --- Professional ---                        [phone], Esophagogastroduodenoscopy, flexible,                         transoral; diagnostic, including collection of                         specimen(s) by brushing or washing, when performed                         (separate procedure) Diagnosis Code(s):     --- Professional ---  D50.9, Iron deficiency anemia, unspecified                        K31.89, Other diseases of stomach and duodenum CPT copyright 2019 American Medical Association. All rights reserved. The codes documented in this report are preliminary and upon coder review may  be revised to meet current compliance requirements. Lucilla Lame MD, MD 07/26/2020 7:56:53 AM This report has been signed electronically. Number of Addenda: 0 Note Initiated On: 07/26/2020 7:34 AM Total Procedure Duration: 0 hours 1 minute 52 seconds  Estimated Blood Loss:  Estimated blood loss: none.      [facility]

## 2020-07-26 NOTE — Anesthesia Preprocedure Evaluation (Signed)
Anesthesia Evaluation  Patient identified by MRN, date of birth, ID band Patient awake    Reviewed: Allergy & Precautions, H&P , NPO status , Patient's Chart, lab work & pertinent test results  Airway Mallampati: II  TM Distance: >3 FB Neck ROM: full    Dental no notable dental hx.    Pulmonary shortness of breath, asthma , sleep apnea , COPD, former smoker,    Pulmonary exam normal breath sounds clear to auscultation       Cardiovascular hypertension, Normal cardiovascular exam Rhythm:regular Rate:Normal     Neuro/Psych PSYCHIATRIC DISORDERS    GI/Hepatic   Endo/Other  diabetes  Renal/GU      Musculoskeletal   Abdominal   Peds  Hematology   Anesthesia Other Findings   Reproductive/Obstetrics                             Anesthesia Physical Anesthesia Plan  ASA: III  Anesthesia Plan: General   Post-op Pain Management:    Induction: Intravenous  PONV Risk Score and Plan: 3 and Treatment may vary due to age or medical condition, TIVA and Propofol infusion  Airway Management Planned: Natural Airway  Additional Equipment:   Intra-op Plan:   Post-operative Plan:   Informed Consent: I have reviewed the patients History and Physical, chart, labs and discussed the procedure including the risks, benefits and alternatives for the proposed anesthesia with the patient or authorized representative who has indicated his/her understanding and acceptance.     Dental Advisory Given  Plan Discussed with: CRNA  Anesthesia Plan Comments:         Anesthesia Quick Evaluation

## 2020-07-26 NOTE — Anesthesia Procedure Notes (Signed)
Procedure Name: MAC Date/Time: 07/26/2020 7:39 AM Performed by: Jeannene Patella, CRNA Pre-anesthesia Checklist: Patient identified, Emergency Drugs available, Suction available, Timeout performed and Patient being monitored Patient Re-evaluated:Patient Re-evaluated prior to induction Oxygen Delivery Method: Nasal cannula Placement Confirmation: positive ETCO2

## 2020-07-26 NOTE — Anesthesia Postprocedure Evaluation (Signed)
Anesthesia Post Note  Patient: [name]  Procedure(s) Performed: COLONOSCOPY WITH PROPOFOL (N/A Rectum) ESOPHAGOGASTRODUODENOSCOPY (EGD) WITH PROPOFOL (N/A Throat)     Patient location during evaluation: PACU Anesthesia Type: General Level of consciousness: awake and alert and oriented Pain management: satisfactory to patient Vital Signs Assessment: post-procedure vital signs reviewed and stable Respiratory status: spontaneous breathing, nonlabored ventilation and respiratory function stable Cardiovascular status: blood pressure returned to baseline and stable Postop Assessment: Adequate PO intake and No signs of nausea or vomiting Anesthetic complications: no   No complications documented.  Raliegh Ip

## 2020-07-26 NOTE — H&P (Signed)
Lucilla Lame, MD [facility] [address], [place],  [address] [phone] Fax : [phone]  Primary Care Physician:  Ronnell Freshwater, NP Primary Gastroenterologist:  Dr. Allen Norris  Pre-Procedure History & Physical: HPI:  Angela Jensen is a 64 y.o. female is here for an endoscopy and colonoscopy.   Past Medical History:  Diagnosis Date  . Arthritis   . Asthma   . Atherosclerosis of native arteries of extremity with intermittent claudication ([place]) 05/26/2016  . Cancer [facility]) 2012   Right Lung CA  . COPD (chronic obstructive pulmonary disease) ([place])   . Depression   . Diabetes mellitus without complication [facility])    Patient takes Janumet  . Essential hypertension 05/26/2016  . Hypercholesteremia   . 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 PAD (peripheral artery disease) ([place]) 06/22/2016  . Peripheral vascular disease ([place])   . Personal history of radiation therapy   . Shortness of breath dyspnea    with exertion   . Sleep apnea   . Wears dentures    full upper and lower    Past Surgical History:  Procedure Laterality Date  . CESAREAN SECTION     x3  . COLONOSCOPY WITH PROPOFOL N/A 06/25/2015   Procedure: COLONOSCOPY WITH PROPOFOL;  Surgeon: Lucilla Lame, MD;  Location: [facility] ENDOSCOPY;  Service: Endoscopy;  Laterality: N/A;  . CYST REMOVAL LEG     and on shoulder   . LOWER EXTREMITY ANGIOGRAPHY Left 09/29/2018   Procedure: LOWER EXTREMITY ANGIOGRAPHY;  Surgeon: Algernon Huxley, MD;  Location: [place] [facility];  Service: Cardiovascular;  Laterality: Left;  . LUNG BIOPSY  05 15 2013   has lung "spots"  . PERIPHERAL VASCULAR CATHETERIZATION Left 06/01/2016   Procedure: Lower Extremity Angiography;  Surgeon: Algernon Huxley, MD;  Location: [place] [facility];  Service: Cardiovascular;  Laterality: Left;  . PERIPHERAL VASCULAR CATHETERIZATION N/A 06/01/2016   Procedure: Abdominal Aortogram w/Lower Extremity;  Surgeon: Algernon Huxley, MD;  Location: [place] [facility];   Service: Cardiovascular;  Laterality: N/A;  . PERIPHERAL VASCULAR CATHETERIZATION  06/01/2016   Procedure: Lower Extremity Intervention;  Surgeon: Algernon Huxley, MD;  Location: [place] [facility];  Service: Cardiovascular;;  . PERIPHERAL VASCULAR CATHETERIZATION Right 06/08/2016   Procedure: Lower Extremity Angiography;  Surgeon: Algernon Huxley, MD;  Location: [place] [facility];  Service: Cardiovascular;  Laterality: Right;  . PERIPHERAL VASCULAR CATHETERIZATION  06/08/2016   Procedure: Lower Extremity Intervention;  Surgeon: Algernon Huxley, MD;  Location: [place] [facility];  Service: Cardiovascular;;    Prior to Admission medications   Medication Sig Start Date End Date Taking? Authorizing Provider  albuterol (PROVENTIL) (2.5 MG/3ML) 0.083% nebulizer solution Take 3 mLs (2.5 mg total) by nebulization every 6 (six) hours as needed for wheezing. 10/05/19  Yes Boscia, Heather E, NP  amLODipine (NORVASC) 10 MG tablet Take 1 tablet (10 mg total) by mouth every morning. 12/25/19  Yes Ronnell Freshwater, NP  aspirin EC 81 MG tablet Take 1 tablet (81 mg total) by mouth daily. 06/01/16  Yes Dew, Erskine Squibb, MD  buPROPion (WELLBUTRIN XL) 150 MG 24 hr tablet TAKE 1 TABLET BY MOUTH TWICE DAILY FOR SMOKING CESSATION 07/04/20  Yes Boscia, Heather E, NP  ferrous sulfate 324 MG TBEC Take 324 mg by mouth.   Yes [provider]  gabapentin (NEURONTIN) 100 MG capsule Take 1 capsule (100 mg total) by mouth 2 (two) times daily. 06/14/20  Yes Ronnell Freshwater, NP  JANUVIA 25 MG tablet Take 1 tablet by mouth once daily 06/24/20  Yes Boscia, Heather E, NP  lisinopril-hydrochlorothiazide (ZESTORETIC) 10-12.5 MG tablet Take 1 tablet by mouth every morning. 06/24/20  Yes Boscia, Greer Ee, NP  meloxicam (MOBIC) 7.5 MG tablet Take 7.5 mg by mouth 2 (two) times daily. 06/27/20  Yes [provider]  metFORMIN (GLUCOPHAGE-XR) 500 MG 24 hr tablet Take 1 tablet (500 mg total) by mouth daily with breakfast. 06/24/20   Yes Boscia, Heather E, NP  mometasone-formoterol (DULERA) 200-5 MCG/ACT AERO Inhale 1 puff into the lungs 2 (two) times daily. Please fill as 90 day prescription 01/17/20  Yes Boscia, Heather E, NP  potassium chloride (KLOR-CON) 10 MEQ tablet TAKE 1 TABLET BY MOUTH EVERY OTHER DAY FOR  LOW  POTASSIUM Patient taking differently: Take 10 mEq by mouth every other day.  04/10/20  Yes Luiz Ochoa, NP  rOPINIRole (REQUIP) 0.5 MG tablet Take 1 tablet (0.5 mg total) by mouth 2 (two) times daily. 06/24/20  Yes Boscia, Greer Ee, NP  rosuvastatin (CRESTOR) 10 MG tablet Take 1 tablet (10 mg total) by mouth at bedtime. 05/05/20  Yes Shahmehdi, Valeria Batman, MD  SPIRIVA HANDIHALER 18 MCG inhalation capsule INHALE THE CONTENTS OF ONE CAPSULE BY MOUTH ONCE DAILY *DO NOT SWALLOW CAPSULE* 05/06/20  Yes [name], MD  VENTOLIN HFA 108 [phone] Base) MCG/ACT inhaler INHALE 2 PUFFS INTO LUNGS EVERY 4 HOURS AS NEEDED FOR WHEEZING OR SHORTNESS OF BREATH 06/14/20  Yes Ronnell Freshwater, NP  Accu-Chek FastClix Lancets MISC Use as directed twice a day diag E11.65 03/15/19   Ronnell Freshwater, NP  clopidogrel (PLAVIX) 75 MG tablet Take 1 tablet by mouth once daily 06/24/20   Kris Hartmann, NP  cyclobenzaprine (FLEXERIL) 5 MG tablet Take 1 tablet (5 mg total) by mouth 3 (three) times daily as needed. Patient not taking: Reported on 07/24/2020 06/30/20   Menshew, Dannielle Karvonen, PA-C  glucose blood (ACCU-CHEK AVIVA PLUS) test strip Use as instructed to check blood sugars three times daily.  E11.65 03/16/19   Ronnell Freshwater, NP  polyethylene glycol-electrolytes (NULYTELY) 420 g solution Drink one 8 oz glass every 20 mins until entire container is finished starting at 5:00pm on 07/25/20 07/23/20   Lucilla Lame, MD    Allergies as of 07/23/2020  . (No Known Allergies)    Family History  Problem Relation Age of Onset  . Lung cancer Father   . Diabetes Mother   . Hypercholesterolemia Mother   . Diabetes Sister   . Diabetes  Maternal Grandmother   . Diabetes Paternal Grandmother   . Diabetes Sister   . Heart attack Brother   . Coronary artery disease Brother   . Vascular Disease Brother   . Hypertension Sister   . Heart attack Brother     Social History   Socioeconomic History  . Marital status: Widowed    Spouse name: Not on file  . Number of children: Not on file  . Years of education: Not on file  . Highest education level: Not on file  Occupational History  . Not on file  Tobacco Use  . Smoking status: Former Smoker    Packs/day: 1.00    Years: 37.00    Pack years: 37.00    Types: Cigarettes    Quit date: 02/06/2010    Years since quitting: 10.4  . Smokeless tobacco: Former Systems developer    Types: Snuff  Vaping Use  . Vaping Use: Never used  Substance and Sexual Activity  . Alcohol use: Yes    Alcohol/week: 5.0 standard drinks    Types: 5 Cans of beer per week    Comment: /h x of alcohol abuse -stopped 2012- now drinks 5 beer per w  . Drug use: Not Currently    Types: Marijuana, "Crack" cocaine, Cocaine    Comment: hx of cocaine use- last use 2015; last use sat marijuana6/22/19,   . Sexual activity: Yes  Other Topics Concern  . Not on file  Social History Narrative   Lives with Significant Other x 43 years   Social Determinants of Health   Financial Resource Strain: Not on file  Food Insecurity: Not on file  Transportation Needs: Not on file  Physical Activity: Not on file  Stress: Not on file  Social Connections: Not on file  Intimate Partner Violence: Not on file    Review of Systems: See HPI, otherwise negative ROS  Physical Exam: Ht 4\' 11"  (1.499 m)   Wt 47.6 kg   BMI 21.21 kg/m  General:   Alert,  pleasant and cooperative in NAD Head:  Normocephalic and atraumatic. Neck:  Supple; no masses or thyromegaly. Lungs:  Clear throughout to auscultation.    Heart:  Regular rate and rhythm. Abdomen:  Soft, nontender and nondistended. Normal bowel sounds, without guarding, and  without rebound.   Neurologic:  Alert and  oriented x4;  grossly normal neurologically.  Impression/Plan: Angela Jensen is here for an endoscopy and colonoscopy to be performed for ida  Risks, benefits, limitations, and alternatives regarding  endoscopy and colonoscopy have been reviewed with the patient.  Questions have been answered.  All parties agreeable.   Lucilla Lame, MD  07/26/2020, 7:20 AM

## 2020-07-26 NOTE — Transfer of Care (Signed)
Immediate Anesthesia Transfer of Care Note  Patient: Angela Jensen  Procedure(s) Performed: COLONOSCOPY WITH PROPOFOL (N/A Rectum) ESOPHAGOGASTRODUODENOSCOPY (EGD) WITH PROPOFOL (N/A Throat)  Patient Location: PACU  Anesthesia Type: General  Level of Consciousness: awake, alert  and patient cooperative  Airway and Oxygen Therapy: Patient Spontanous Breathing and Patient connected to supplemental oxygen  Post-op Assessment: Post-op Vital signs reviewed, Patient's Cardiovascular Status Stable, Respiratory Function Stable, Patent Airway and No signs of Nausea or vomiting  Post-op Vital Signs: Reviewed and stable  Complications: No complications documented.

## 2020-07-28 ENCOUNTER — Other Ambulatory Visit: Payer: Self-pay | Admitting: Internal Medicine

## 2020-07-28 DIAGNOSIS — J449 Chronic obstructive pulmonary disease, unspecified: Secondary | ICD-10-CM

## 2020-07-29 ENCOUNTER — Encounter: Payer: Self-pay | Admitting: Hospice and Palliative Medicine

## 2020-07-29 ENCOUNTER — Ambulatory Visit (INDEPENDENT_AMBULATORY_CARE_PROVIDER_SITE_OTHER): Payer: BLUE CROSS/BLUE SHIELD | Admitting: Hospice and Palliative Medicine

## 2020-07-29 ENCOUNTER — Other Ambulatory Visit: Payer: Self-pay

## 2020-07-29 VITALS — BP 126/84 | HR 108 | Temp 98.0°F | Resp 16 | Ht 59.0 in

## 2020-07-29 DIAGNOSIS — G4734 Idiopathic sleep related nonobstructive alveolar hypoventilation: Secondary | ICD-10-CM

## 2020-07-29 DIAGNOSIS — E1165 Type 2 diabetes mellitus with hyperglycemia: Secondary | ICD-10-CM | POA: Diagnosis not present

## 2020-07-29 DIAGNOSIS — C3431 Malignant neoplasm of lower lobe, right bronchus or lung: Secondary | ICD-10-CM | POA: Diagnosis not present

## 2020-07-29 DIAGNOSIS — E041 Nontoxic single thyroid nodule: Secondary | ICD-10-CM | POA: Diagnosis not present

## 2020-07-29 DIAGNOSIS — Z0001 Encounter for general adult medical examination with abnormal findings: Secondary | ICD-10-CM

## 2020-07-29 DIAGNOSIS — R300 Dysuria: Secondary | ICD-10-CM

## 2020-07-29 LAB — POCT GLYCOSYLATED HEMOGLOBIN (HGB A1C): Hemoglobin A1C: 5.4 % (ref 4.0–5.6)

## 2020-07-29 NOTE — Progress Notes (Signed)
[facility] [place], [place] [address]  Internal MEDICINE  Office Visit Note  Patient Name: Angela Jensen  [id number]  [id number]  Date of Service: 08/01/2020  Chief Complaint  Patient presents with  . Annual Exam  . Diabetes  . Hyperlipidemia  . Hypertension  . 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 Accu-Chek FastClix Lancets MISC Use as directed twice a day diag E11.65  . albuterol (PROVENTIL) (2.5 MG/3ML) 0.083% nebulizer solution Take 3 mLs (2.5 mg total) by nebulization every 6 (six) hours as needed for wheezing.  Marland Kitchen amLODipine (NORVASC) 10 MG tablet Take 1 tablet (10 mg total) by mouth every morning.  Marland Kitchen aspirin EC 81 MG tablet Take 1 tablet (81 mg total) by mouth daily.  Marland Kitchen buPROPion (WELLBUTRIN XL) 150 MG 24 hr tablet TAKE 1 TABLET BY MOUTH TWICE DAILY FOR SMOKING CESSATION  . clopidogrel (PLAVIX) 75 MG tablet Take 1 tablet by mouth once daily  . cyclobenzaprine (FLEXERIL) 5 MG tablet Take 1 tablet (5 mg total) by mouth 3 (three) times daily as needed.  . ferrous sulfate 324 MG TBEC Take 324 mg by mouth.  . gabapentin (NEURONTIN) 100 MG capsule Take 1 capsule (100 mg total) by mouth 2 (two) times daily.  Marland Kitchen glucose blood (ACCU-CHEK  AVIVA PLUS) test strip Use as instructed to check blood sugars three times daily.  E11.65  . JANUVIA 25 MG tablet Take 1 tablet by mouth once daily  . lisinopril-hydrochlorothiazide (ZESTORETIC) 10-12.5 MG tablet Take 1 tablet by mouth every morning.  . meloxicam (MOBIC) 7.5 MG tablet Take 7.5 mg by mouth 2 (two) times daily.  . metFORMIN (GLUCOPHAGE-XR) 500 MG 24 hr tablet Take 1 tablet (500 mg total) by mouth daily with breakfast.  . mometasone-formoterol (DULERA) 200-5 MCG/ACT AERO Inhale 1 puff into the lungs 2 (two) times daily. Please fill as 90 day prescription  . polyethylene glycol-electrolytes (NULYTELY) 420 g solution Drink one 8 oz glass every 20 mins until entire container is finished starting at 5:00pm on 07/25/20  . potassium chloride (KLOR-CON) 10 MEQ tablet TAKE 1 TABLET BY MOUTH EVERY OTHER DAY FOR  LOW  POTASSIUM (Patient taking differently: Take 10 mEq by mouth every other day.)  . rOPINIRole (REQUIP) 0.5 MG tablet Take 1 tablet (0.5 mg total) by mouth 2 (two) times daily.  . rosuvastatin (CRESTOR) 10 MG tablet Take 1 tablet (10 mg total) by mouth at bedtime.  Marland Kitchen SPIRIVA HANDIHALER 18 MCG inhalation capsule INHALE THE CONTENTS OF ONE CAPSULE BY MOUTH ONCE DAILY. DO NOT SWALLOW CAPSULE.  . VENTOLIN HFA 108 (90 Base) MCG/ACT inhaler INHALE 2 PUFFS INTO LUNGS EVERY 4 HOURS AS NEEDED FOR WHEEZING OR SHORTNESS OF BREATH   No facility-administered encounter medications on file as of 07/29/2020.    Surgical History: Past Surgical History:  Procedure  Laterality Date  . CESAREAN SECTION     x3  . COLONOSCOPY WITH PROPOFOL N/A 06/25/2015   Procedure: COLONOSCOPY WITH PROPOFOL;  Surgeon: Lucilla Lame, MD;  Location: [facility];  Service: Endoscopy;  Laterality: N/A;  . COLONOSCOPY WITH PROPOFOL N/A 07/26/2020   Procedure: COLONOSCOPY WITH PROPOFOL;  Surgeon: Lucilla Lame, MD;  Location: [place];  Service: Endoscopy;  Laterality: N/A;  . CYST REMOVAL LEG     and on  shoulder   . ESOPHAGOGASTRODUODENOSCOPY (EGD) WITH PROPOFOL N/A 07/26/2020   Procedure: ESOPHAGOGASTRODUODENOSCOPY (EGD) WITH PROPOFOL;  Surgeon: Lucilla Lame, MD;  Location: [place];  Service: Endoscopy;  Laterality: N/A;  Diabetic - oral meds  . LOWER EXTREMITY ANGIOGRAPHY Left 09/29/2018   Procedure: LOWER EXTREMITY ANGIOGRAPHY;  Surgeon: Algernon Huxley, MD;  Location: [place] [facility];  Service: Cardiovascular;  Laterality: Left;  . LUNG BIOPSY  05 15 2013   has lung "spots"  . PERIPHERAL VASCULAR CATHETERIZATION Left 06/01/2016   Procedure: Lower Extremity Angiography;  Surgeon: Algernon Huxley, MD;  Location: [place] [facility];  Service: Cardiovascular;  Laterality: Left;  . PERIPHERAL VASCULAR CATHETERIZATION N/A 06/01/2016   Procedure: Abdominal Aortogram w/Lower Extremity;  Surgeon: Algernon Huxley, MD;  Location: [place] [facility];  Service: Cardiovascular;  Laterality: N/A;  . PERIPHERAL VASCULAR CATHETERIZATION  06/01/2016   Procedure: Lower Extremity Intervention;  Surgeon: Algernon Huxley, MD;  Location: [place] [facility];  Service: Cardiovascular;;  . PERIPHERAL VASCULAR CATHETERIZATION Right 06/08/2016   Procedure: Lower Extremity Angiography;  Surgeon: Algernon Huxley, MD;  Location: [place] [facility];  Service: Cardiovascular;  Laterality: Right;  . PERIPHERAL VASCULAR CATHETERIZATION  06/08/2016   Procedure: Lower Extremity Intervention;  Surgeon: Algernon Huxley, MD;  Location: [place] [facility];  Service: Cardiovascular;;    Medical History: Past Medical History:  Diagnosis Date  . Arthritis   . Asthma   . Atherosclerosis of native arteries of extremity with intermittent claudication ([place]) 05/26/2016  . Cancer [facility]) 2012   Right Lung CA  . COPD (chronic obstructive pulmonary disease) ([place])   . Depression   . Diabetes mellitus without complication [facility])    Patient takes Janumet  . Essential hypertension 05/26/2016  . Hypercholesteremia   . Oxygen dependent     2L at nite   . PAD (peripheral artery disease) ([place]) 06/22/2016  . Peripheral vascular disease ([place])   . Personal history of radiation therapy   . Shortness of breath dyspnea    with exertion   . Sleep apnea   . Wears dentures    full upper and lower    Family History: Family History  Problem Relation Age of Onset  . Lung cancer Father   . Diabetes Mother   . Hypercholesterolemia Mother   . Diabetes Sister   . Diabetes Maternal Grandmother   . Diabetes Paternal Grandmother   . Diabetes Sister   . Heart attack Brother   . Coronary artery disease Brother   . Vascular Disease Brother   . Hypertension Sister   . Heart attack Brother    Review of Systems  Constitutional: Negative for chills, diaphoresis and fatigue.  HENT: Negative for ear pain, postnasal drip and sinus pressure.   Eyes: Negative for photophobia, discharge, redness, itching and visual disturbance.  Respiratory: Negative for cough, shortness of breath and wheezing.   Cardiovascular: Negative for chest pain, palpitations and leg swelling.  Gastrointestinal: Negative for abdominal pain, constipation, diarrhea, nausea and  vomiting.  Genitourinary: Negative for dysuria and flank pain.  Musculoskeletal: Negative for arthralgias, back pain, gait problem and neck pain.  Skin: Negative for color change.  Allergic/Immunologic: Negative for environmental allergies and food allergies.  Neurological: Negative for dizziness and headaches.  Hematological: Does not bruise/bleed easily.  Psychiatric/Behavioral: Negative for agitation, behavioral problems (depression) and hallucinations.     Vital Signs: BP 126/84   Pulse (!) 108   Temp 98 F (36.7 C)   Resp 16   Ht 4\' 11"  (1.499 m)   SpO2 95%   BMI 21.49 kg/m    Physical Exam Vitals reviewed.  Constitutional:      Appearance: Normal appearance. She is normal weight.  HENT:     Right Ear: Tympanic membrane normal.     Left Ear: Tympanic membrane normal.      Nose: Nose normal.     Mouth/Throat:     Mouth: Mucous membranes are moist.     Pharynx: Oropharynx is clear.  Eyes:     Pupils: Pupils are equal, round, and reactive to light.  Neck:     Thyroid: Thyroid mass and thyromegaly present.     Comments: Left sided, palpable, small thyroid nodule Cardiovascular:     Rate and Rhythm: Normal rate and regular rhythm.     Pulses: Normal pulses.     Heart sounds: Normal heart sounds.  Pulmonary:     Effort: Pulmonary effort is normal.     Breath sounds: Normal breath sounds.  Chest:  Breasts:     Right: Normal.     Left: Normal.    Abdominal:     General: Abdomen is flat.     Palpations: Abdomen is soft.  Musculoskeletal:        General: Normal range of motion.     Cervical back: Normal range of motion.  Skin:    General: Skin is warm.  Neurological:     General: No focal deficit present.     Mental Status: She is alert and oriented to person, place, and time. Mental status is at baseline.  Psychiatric:        Mood and Affect: Mood normal.        Behavior: Behavior normal.        Thought Content: Thought content normal.        Judgment: Judgment normal.      LABS: Recent Results (from the past 2160 hour(s))  CBC     Status: Abnormal   Collection Time: 05/04/20  5:59 AM  Result Value Ref Range   WBC 6.8 4.0 - 10.5 K/uL   RBC 3.68 (L) 3.87 - 5.11 MIL/uL   Hemoglobin 8.5 (L) 12.0 - 15.0 g/dL   HCT 29.1 (L) 36.0 - 46.0 %   MCV 79.1 (L) 80.0 - 100.0 fL   MCH 23.1 (L) 26.0 - 34.0 pg   MCHC 29.2 (L) 30.0 - 36.0 g/dL   RDW 27.3 (H) 11.5 - 15.5 %   Platelets 266 150 - 400 K/uL   nRBC 0.0 0.0 - 0.2 %    Comment: Performed at [facility], [address], [address], [place] [address]  Basic metabolic panel     Status: Abnormal   Collection Time: 05/04/20  5:59 AM  Result Value Ref Range   Sodium 140 135 - 145 mmol/L   Potassium 3.7 3.5 - 5.1 mmol/L   Chloride 107 98 - 111 mmol/L   CO2 28 22 - 32 mmol/L   Glucose,  Bld 100 (H) 70 - 99 mg/dL    Comment: Glucose reference range applies only to samples taken after fasting for at least 8 hours.   BUN 11 8 - 23 mg/dL   Creatinine, Ser 0.53 0.44 - 1.00 mg/dL   Calcium 8.3 (L) 8.9 - 10.3 mg/dL   GFR calc non Af Amer >60 >60 mL/min   GFR calc Af Amer >60 >60 mL/min   Anion gap 5 5 - 15    Comment: Performed at [facility], [address] [place], [place] [address]  Glucose, capillary     Status: Abnormal   Collection Time: 05/04/20  7:55 AM  Result Value Ref Range   Glucose-Capillary 102 (H) 70 - 99 mg/dL    Comment: Glucose reference range applies only to samples taken after fasting for at least 8 hours.  Glucose, capillary     Status: Abnormal   Collection Time: 05/04/20 12:10 PM  Result Value Ref Range   Glucose-Capillary 129 (H) 70 - 99 mg/dL    Comment: Glucose reference range applies only to samples taken after fasting for at least 8 hours.  Glucose, capillary     Status: Abnormal   Collection Time: 05/04/20  4:37 PM  Result Value Ref Range   Glucose-Capillary 112 (H) 70 - 99 mg/dL    Comment: Glucose reference range applies only to samples taken after fasting for at least 8 hours.  Glucose, capillary     Status: Abnormal   Collection Time: 05/04/20  9:01 PM  Result Value Ref Range   Glucose-Capillary 106 (H) 70 - 99 mg/dL    Comment: Glucose reference range applies only to samples taken after fasting for at least 8 hours.  CBC     Status: Abnormal   Collection Time: 05/05/20  5:58 AM  Result Value Ref Range   WBC 6.7 4.0 - 10.5 K/uL   RBC 3.79 (L) 3.87 - 5.11 MIL/uL   Hemoglobin 8.8 (L) 12.0 - 15.0 g/dL   HCT 30.0 (L) 36.0 - 46.0 %   MCV 79.2 (L) 80.0 - 100.0 fL   MCH 23.2 (L) 26.0 - 34.0 pg   MCHC 29.3 (L) 30.0 - 36.0 g/dL   RDW 27.3 (H) 11.5 - 15.5 %   Platelets 244 150 - 400 K/uL   nRBC 0.0 0.0 - 0.2 %    Comment: Performed at [facility], [address], [place], [place] [address]  Basic metabolic panel      Status: Abnormal   Collection Time: 05/05/20  5:58 AM  Result Value Ref Range   Sodium 141 135 - 145 mmol/L   Potassium 3.7 3.5 - 5.1 mmol/L   Chloride 105 98 - 111 mmol/L   CO2 30 22 - 32 mmol/L   Glucose, Bld 89 70 - 99 mg/dL    Comment: Glucose reference range applies only to samples taken after fasting for at least 8 hours.   BUN 10 8 - 23 mg/dL   Creatinine, Ser 0.54 0.44 - 1.00 mg/dL   Calcium 8.5 (L) 8.9 - 10.3 mg/dL   GFR calc non Af Amer >60 >60 mL/min   GFR calc Af Amer >60 >60 mL/min   Anion gap 6 5 - 15    Comment: Performed at [facility], [address] [place], [place] [address]  Glucose, capillary     Status: None   Collection Time: 05/05/20  7:38 AM  Result Value Ref Range   Glucose-Capillary 97 70 - 99  mg/dL    Comment: Glucose reference range applies only to samples taken after fasting for at least 8 hours.  Glucose, capillary     Status: Abnormal   Collection Time: 05/05/20 11:40 AM  Result Value Ref Range   Glucose-Capillary 129 (H) 70 - 99 mg/dL    Comment: Glucose reference range applies only to samples taken after fasting for at least 8 hours.  CBC     Status: Abnormal   Collection Time: 05/06/20 10:48 AM  Result Value Ref Range   WBC 7.5 4.0 - 10.5 K/uL   RBC 4.21 3.87 - 5.11 MIL/uL   Hemoglobin 10.0 (L) 12.0 - 15.0 g/dL   HCT 33.0 (L) 36.0 - 46.0 %   MCV 78.4 (L) 80.0 - 100.0 fL   MCH 23.8 (L) 26.0 - 34.0 pg   MCHC 30.3 30.0 - 36.0 g/dL   RDW 27.7 (H) 11.5 - 15.5 %   Platelets 261 150 - 400 K/uL   nRBC 0.0 0.0 - 0.2 %    Comment: Performed at [facility], [address], [place], [place] [address]  Pulmonary function test     Status: None   Collection Time: 05/15/20  3:00 PM  Result Value Ref Range   FEV1     FVC     FEV1/FVC     TLC     DLCO    Ferritin     Status: None   Collection Time: 05/27/20 11:00 AM  Result Value Ref Range   Ferritin 20 11 - 307 ng/mL    Comment: Performed at [facility], [address] [place], [place] [address]  CBC with Differential/Platelet     Status: Abnormal   Collection Time: 05/27/20 11:00 AM  Result Value Ref Range   WBC 5.6 4.0 - 10.5 K/uL   RBC 4.09 3.87 - 5.11 MIL/uL   Hemoglobin 10.2 (L) 12.0 - 15.0 g/dL   HCT 34.8 (L) 36.0 - 46.0 %   MCV 85.1 80.0 - 100.0 fL   MCH 24.9 (L) 26.0 - 34.0 pg   MCHC 29.3 (L) 30.0 - 36.0 g/dL   RDW 27.2 (H) 11.5 - 15.5 %   Platelets 364 150 - 400 K/uL   nRBC 0.0 0.0 - 0.2 %   Neutrophils Relative % 70 %   Neutro Abs 4.0 1.7 - 7.7 K/uL   Lymphocytes Relative 16 %   Lymphs Abs 0.9 0.7 - 4.0 K/uL   Monocytes Relative 9 %   Monocytes Absolute 0.5 0.1 - 1.0 K/uL   Eosinophils Relative 3 %   Eosinophils Absolute 0.2 0.0 - 0.5 K/uL   Basophils Relative 1 %   Basophils Absolute 0.0 0.0 - 0.1 K/uL   Immature Granulocytes 1 %   Abs Immature Granulocytes 0.03 0.00 - 0.07 K/uL    Comment: Performed at [facility], [address] [place], [place] [address]  Urinalysis, Complete w Microscopic Urine, Clean Catch     Status: Abnormal   Collection Time: 06/30/20  1:25 PM  Result Value Ref Range   Color, Urine YELLOW (A) YELLOW   APPearance HAZY (A) CLEAR   Specific Gravity, Urine 1.023 1.005 - 1.030   pH 5.0 5.0 - 8.0   Glucose, UA NEGATIVE NEGATIVE mg/dL   Hgb urine dipstick NEGATIVE NEGATIVE   Bilirubin Urine NEGATIVE NEGATIVE   Ketones, ur 5 (A) NEGATIVE mg/dL   Protein, ur NEGATIVE NEGATIVE mg/dL   Nitrite NEGATIVE NEGATIVE   Leukocytes,Ua NEGATIVE  NEGATIVE   RBC / HPF 0-5 0 - 5 RBC/hpf   WBC, UA 0-5 0 - 5 WBC/hpf   Bacteria, UA NONE SEEN NONE SEEN   Squamous Epithelial / LPF 0-5 0 - 5   Mucus PRESENT    Hyaline Casts, UA PRESENT     Comment: Performed at [facility], [address] [place], [place] [address]  CBC     Status: Abnormal   Collection Time: 07/22/20  2:33 PM  Result Value Ref Range   WBC 4.6 4.0 - 10.5 K/uL   RBC 4.07 3.87 - 5.11 MIL/uL   Hemoglobin 11.5 (L) 12.0 -  15.0 g/dL   HCT 36.5 36.0 - 46.0 %   MCV 89.7 80.0 - 100.0 fL   MCH 28.3 26.0 - 34.0 pg   MCHC 31.5 30.0 - 36.0 g/dL   RDW 15.0 11.5 - 15.5 %   Platelets 323 150 - 400 K/uL   nRBC 0.0 0.0 - 0.2 %    Comment: Performed at [facility], [address], [place], [place] [address]  SARS CORONAVIRUS 2 (TAT 6-24 HRS)     Status: None   Collection Time: 07/24/20 12:05 PM  Result Value Ref Range   SARS Coronavirus 2 NEGATIVE NEGATIVE    Comment: (NOTE) SARS-CoV-2 target nucleic acids are NOT DETECTED.  The SARS-CoV-2 RNA is generally detectable in upper and lower respiratory specimens during the acute phase of infection. Negative results do not preclude SARS-CoV-2 infection, do not rule out co-infections with other pathogens, and should not be used as the sole basis for treatment or other patient management decisions. Negative results must be combined with clinical observations, patient history, and epidemiological information. The expected result is Negative.  Fact Sheet for Patients: SugarRoll.be  Fact Sheet for Healthcare Providers: https://www.woods-mathews.com/  This test is not yet approved or cleared by the [place] FDA and  has been authorized for detection and/or diagnosis of SARS-CoV-2 by FDA under an Emergency Use Authorization (EUA). This EUA will remain  in effect (meaning this test can be used) for the duration of the COVID-19 declaration under Se ction 564(b)(1) of the Act, 21 U.S.C. section 360bbb-3(b)(1), unless the authorization is terminated or revoked sooner.  Performed at [facility], [place] [address], [place], [place] [address]   Glucose, capillary     Status: Abnormal   Collection Time: 07/26/20  7:22 AM  Result Value Ref Range   Glucose-Capillary 117 (H) 70 - 99 mg/dL    Comment: Glucose reference range applies only to samples taken after fasting for at least 8 hours.  Glucose, capillary      Status: Abnormal   Collection Time: 07/26/20  8:23 AM  Result Value Ref Range   Glucose-Capillary 111 (H) 70 - 99 mg/dL    Comment: Glucose reference range applies only to samples taken after fasting for at least 8 hours.  UA/M w/rflx Culture, Routine     Status: Abnormal   Collection Time: 07/29/20 11:37 AM   Specimen: Urine   Urine  Result Value Ref Range   Specific Gravity, UA 1.014 1.005 - 1.030   pH, UA 5.0 5.0 - 7.5   Color, UA Yellow Yellow   Appearance Ur Clear Clear   Leukocytes,UA Negative Negative   Protein,UA Negative Negative/Trace   Glucose, UA Negative Negative   Ketones, UA Negative Negative   RBC, UA 1+ (A) Negative   Bilirubin, UA Negative Negative   Urobilinogen, Ur 0.2 0.2 -  1.0 mg/dL   Nitrite, UA Negative Negative   Microscopic Examination See below:     Comment: Microscopic was indicated and was performed.   Urinalysis Reflex Comment     Comment: This specimen will not reflex to a Urine Culture.  Microscopic Examination     Status: None   Collection Time: 07/29/20 11:37 AM   Urine  Result Value Ref Range   WBC, UA None seen 0 - 5 /hpf   RBC None seen 0 - 2 /hpf   Epithelial Cells (non renal) 0-10 0 - 10 /hpf   Casts None seen None seen /lpf   Bacteria, UA None seen None seen/Few  POCT HgB A1C     Status: None   Collection Time: 07/29/20 12:18 PM  Result Value Ref Range   Hemoglobin A1C 5.4 4.0 - 5.6 %   HbA1c POC (<> result, manual entry)     HbA1c, POC (prediabetic range)     HbA1c, POC (controlled diabetic range)      Assessment/Plan: 1. Encounter for routine adult health examination with abnormal findings Well appearing 64 year old AA female Lab slip given to have labs drawn with labs ordered by oncologist at the end of this month Up to date on PHM  2. Uncontrolled type 2 diabetes mellitus with hyperglycemia (HCC) A1C 5.4 today--well controlled, appetite has returned, will continue on current therapy, advised to monitor for episodes of  hypoglycemia - POCT HgB A1C  3. Malignant neoplasm of lower lobe of right lung [facility]) Has completed radiation therapy--follow-up CT scan scheduled for January  4. Thyroid nodule Will obtain US due to palpable nodule felt on exam--will adjust plan of care as indicated - US THYROID; Future  5. Nocturnal hypoxia Continue with overnight oxygen therapy--continue to monitor  6. Dysuria - UA/M w/rflx Culture, Routine - Microscopic Examination  General Counseling: Angela Jensen verbalizes understanding of the findings of todays visit and agrees with plan of treatment. I have discussed any further diagnostic evaluation that may be needed or ordered today. We also reviewed her medications today. she has been encouraged to call the office with any questions or concerns that should arise related to todays visit.    Counseling:    Orders Placed This Encounter  Procedures  . Microscopic Examination  . US THYROID  . UA/M w/rflx Culture, Routine  . POCT HgB A1C      Total time spent: 30 Minutes  Time spent includes review of chart, medications, test results, and follow up plan with the patient.   This patient was seen by Casey Burkitt AGNP-C Collaboration with Dr [name] as a part of collaborative care agreement   Tanna Furry. [facility] Internal Medicine

## 2020-07-30 LAB — UA/M W/RFLX CULTURE, ROUTINE
Bilirubin, UA: NEGATIVE
Glucose, UA: NEGATIVE
Ketones, UA: NEGATIVE
Leukocytes,UA: NEGATIVE
Nitrite, UA: NEGATIVE
Protein,UA: NEGATIVE
Specific Gravity, UA: 1.014 (ref 1.005–1.030)
Urobilinogen, Ur: 0.2 mg/dL (ref 0.2–1.0)
pH, UA: 5 (ref 5.0–7.5)

## 2020-07-30 LAB — MICROSCOPIC EXAMINATION
Bacteria, UA: NONE SEEN
Casts: NONE SEEN /lpf
RBC, Urine: NONE SEEN /hpf (ref 0–2)
WBC, UA: NONE SEEN /hpf (ref 0–5)

## 2020-08-01 ENCOUNTER — Encounter: Payer: Self-pay | Admitting: Hospice and Palliative Medicine

## 2020-08-05 ENCOUNTER — Telehealth: Payer: Self-pay

## 2020-08-05 NOTE — Telephone Encounter (Signed)
-----   Message from Luiz Ochoa, NP sent at 08/05/2020  1:31 PM EST ----- She can cut amlodipine dose in half for a few days, if BP continues to be low after decreasing dose she can stop amlodipine. If she continues to have issues we will need to see her in the office. I would like to see her in about 2 weeks anyway to talk about her BP but in the meantime she can taper down her amlodipine. Please get her scheduled for 2 week follow-up. Thanks!

## 2020-08-05 NOTE — Telephone Encounter (Signed)
Pt dvised of new direction for Bp

## 2020-08-05 NOTE — Telephone Encounter (Signed)
Spoke to pt and informed her of new direction on amlodipine for her Bp.  Pt informed that if Bp continues to be low to stop the amlodipine.  Pt needs to sched a 2 week fup for Bp with taylor and pt will call back and get the appt scheduled.

## 2020-08-12 ENCOUNTER — Ambulatory Visit: Payer: BLUE CROSS/BLUE SHIELD | Admitting: Hospice and Palliative Medicine

## 2020-08-12 ENCOUNTER — Other Ambulatory Visit: Payer: Self-pay

## 2020-08-12 ENCOUNTER — Encounter: Payer: Self-pay | Admitting: Hospice and Palliative Medicine

## 2020-08-12 VITALS — BP 138/88 | HR 100 | Temp 97.4°F | Resp 16 | Ht 59.0 in | Wt 102.4 lb

## 2020-08-12 DIAGNOSIS — I10 Essential (primary) hypertension: Secondary | ICD-10-CM

## 2020-08-12 DIAGNOSIS — R000 Tachycardia, unspecified: Secondary | ICD-10-CM

## 2020-08-12 NOTE — Progress Notes (Signed)
[facility] [place], [place] [address]  Internal MEDICINE  Office Visit Note  Patient Name: Angela Jensen  [id number]  [id number]  Date of Service: 08/12/2020  Chief Complaint  Patient presents with  . Acute Visit    BP has been low, started this last week  . Hypertension  . Hyperlipidemia  . Sleep Apnea  . Depression  . Diabetes  . COPD  . Asthma    HPI Patient is here for acute visit She is concerned today about low BP readings she has had at home Also complaining of dizziness--especially in the mornings when she wakes up Reports that she has had home BP readings as low as 70/50--several 80/60's She called into office 12/20 complaining of low BP--was instructed to take 1/2 dose of amlodipine for total of 5 mg daily, try for 3-4 days and if BP remains low she can stop amlodipine all together  Since contacting office she cut amlodipine dose in half and has since stopped amlodipine due to continued low readings at home She is currently taking lisinopril-HCTZ at night  Discussed BP readings stable today, slightly elevated HR--on home monitoring HR has averaged 80-90's   Current Medication: Outpatient Encounter Medications as of 08/12/2020  Medication Sig  . Accu-Chek FastClix Lancets MISC Use as directed twice a day diag E11.65  . albuterol (PROVENTIL) (2.5 MG/3ML) 0.083% nebulizer solution Take 3 mLs (2.5 mg total) by nebulization every 6 (six) hours as needed for wheezing.  Marland Kitchen amLODipine (NORVASC) 10 MG tablet Take 1 tablet (10 mg total) by mouth every morning.  Marland Kitchen aspirin EC 81 MG tablet Take 1 tablet (81 mg total) by mouth daily.  Marland Kitchen buPROPion (WELLBUTRIN XL) 150 MG 24 hr tablet TAKE 1 TABLET BY MOUTH TWICE DAILY FOR SMOKING CESSATION  . clopidogrel (PLAVIX) 75 MG tablet Take 1 tablet by mouth once daily  . cyclobenzaprine (FLEXERIL) 5 MG tablet Take 1 tablet (5 mg total) by mouth 3 (three) times daily as needed.  . ferrous sulfate 324 MG TBEC  Take 324 mg by mouth.  . gabapentin (NEURONTIN) 100 MG capsule Take 1 capsule (100 mg total) by mouth 2 (two) times daily.  Marland Kitchen glucose blood (ACCU-CHEK AVIVA PLUS) test strip Use as instructed to check blood sugars three times daily.  E11.65  . JANUVIA 25 MG tablet Take 1 tablet by mouth once daily  . lisinopril-hydrochlorothiazide (ZESTORETIC) 10-12.5 MG tablet Take 1 tablet by mouth every morning.  . meloxicam (MOBIC) 7.5 MG tablet Take 7.5 mg by mouth 2 (two) times daily.  . metFORMIN (GLUCOPHAGE-XR) 500 MG 24 hr tablet Take 1 tablet (500 mg total) by mouth daily with breakfast.  . mometasone-formoterol (DULERA) 200-5 MCG/ACT AERO Inhale 1 puff into the lungs 2 (two) times daily. Please fill as 90 day prescription  . polyethylene glycol-electrolytes (NULYTELY) 420 g solution Drink one 8 oz glass every 20 mins until entire container is finished starting at 5:00pm on 07/25/20  . potassium chloride (KLOR-CON) 10 MEQ tablet TAKE 1 TABLET BY MOUTH EVERY OTHER DAY FOR  LOW  POTASSIUM (Patient taking differently: Take 10 mEq by mouth every other day.)  . rOPINIRole (REQUIP) 0.5 MG tablet Take 1 tablet (0.5 mg total) by mouth 2 (two) times daily.  . rosuvastatin (CRESTOR) 10 MG tablet Take 1 tablet (10 mg total) by mouth at bedtime.  Marland Kitchen SPIRIVA HANDIHALER 18 MCG inhalation capsule INHALE THE CONTENTS OF ONE CAPSULE BY MOUTH ONCE DAILY. DO NOT SWALLOW CAPSULE.  Marland Kitchen  VENTOLIN HFA 108 (90 Base) MCG/ACT inhaler INHALE 2 PUFFS INTO LUNGS EVERY 4 HOURS AS NEEDED FOR WHEEZING OR SHORTNESS OF BREATH   No facility-administered encounter medications on file as of 08/12/2020.    Surgical History: Past Surgical History:  Procedure Laterality Date  . CESAREAN SECTION     x3  . COLONOSCOPY WITH PROPOFOL N/A 06/25/2015   Procedure: COLONOSCOPY WITH PROPOFOL;  Surgeon: Lucilla Lame, MD;  Location: [facility];  Service: Endoscopy;  Laterality: N/A;  . COLONOSCOPY WITH PROPOFOL N/A 07/26/2020   Procedure: COLONOSCOPY  WITH PROPOFOL;  Surgeon: Lucilla Lame, MD;  Location: [place];  Service: Endoscopy;  Laterality: N/A;  . CYST REMOVAL LEG     and on shoulder   . ESOPHAGOGASTRODUODENOSCOPY (EGD) WITH PROPOFOL N/A 07/26/2020   Procedure: ESOPHAGOGASTRODUODENOSCOPY (EGD) WITH PROPOFOL;  Surgeon: Lucilla Lame, MD;  Location: [place];  Service: Endoscopy;  Laterality: N/A;  Diabetic - oral meds  . LOWER EXTREMITY ANGIOGRAPHY Left 09/29/2018   Procedure: LOWER EXTREMITY ANGIOGRAPHY;  Surgeon: Algernon Huxley, MD;  Location: [place] [facility];  Service: Cardiovascular;  Laterality: Left;  . LUNG BIOPSY  05 15 2013   has lung "spots"  . PERIPHERAL VASCULAR CATHETERIZATION Left 06/01/2016   Procedure: Lower Extremity Angiography;  Surgeon: Algernon Huxley, MD;  Location: [place] [facility];  Service: Cardiovascular;  Laterality: Left;  . PERIPHERAL VASCULAR CATHETERIZATION N/A 06/01/2016   Procedure: Abdominal Aortogram w/Lower Extremity;  Surgeon: Algernon Huxley, MD;  Location: [place] [facility];  Service: Cardiovascular;  Laterality: N/A;  . PERIPHERAL VASCULAR CATHETERIZATION  06/01/2016   Procedure: Lower Extremity Intervention;  Surgeon: Algernon Huxley, MD;  Location: [place] [facility];  Service: Cardiovascular;;  . PERIPHERAL VASCULAR CATHETERIZATION Right 06/08/2016   Procedure: Lower Extremity Angiography;  Surgeon: Algernon Huxley, MD;  Location: [place] [facility];  Service: Cardiovascular;  Laterality: Right;  . PERIPHERAL VASCULAR CATHETERIZATION  06/08/2016   Procedure: Lower Extremity Intervention;  Surgeon: Algernon Huxley, MD;  Location: [place] [facility];  Service: Cardiovascular;;    Medical History: Past Medical History:  Diagnosis Date  . Anemia   . Arthritis   . Asthma   . Atherosclerosis of native arteries of extremity with intermittent claudication ([place]) 05/26/2016  . Cancer [facility]) 2012   Right Lung CA  . COPD (chronic obstructive pulmonary disease) ([place])   . Depression    . Diabetes mellitus without complication [facility])    Patient takes Janumet  . Essential hypertension 05/26/2016  . Hypercholesteremia   . 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 PAD (peripheral artery disease) ([place]) 06/22/2016  . Peripheral vascular disease ([place])   . Personal history of radiation therapy   . Shortness of breath dyspnea    with exertion   . Sleep apnea   . Wears dentures    full upper and lower    Family History: Family History  Problem Relation Age of Onset  . Lung cancer Father   . Diabetes Mother   . Hypercholesterolemia Mother   . Diabetes Sister   . Diabetes Maternal Grandmother   . Diabetes Paternal Grandmother   . Diabetes Sister   . Heart attack Brother   . Coronary artery disease Brother   . Vascular Disease Brother   . Hypertension Sister   . Heart attack Brother     Social History   Socioeconomic History  . Marital status: Widowed    Spouse name:  Not on file  . Number of children: Not on file  . Years of education: Not on file  . Highest education level: Not on file  Occupational History  . Not on file  Tobacco Use  . Smoking status: Former Smoker    Packs/day: 1.00    Years: 37.00    Pack years: 37.00    Types: Cigarettes    Quit date: 02/06/2010    Years since quitting: 10.5  . Smokeless tobacco: Former Systems developer    Types: Snuff  Vaping Use  . Vaping Use: Never used  Substance and Sexual Activity  . Alcohol use: Not Currently    Alcohol/week: 5.0 standard drinks    Types: 5 Cans of beer per week    Comment: /h x of alcohol abuse -stopped 2012- now drinks 5 beer per w  . Drug use: Not Currently    Types: Marijuana, "Crack" cocaine, Cocaine    Comment: hx of cocaine use- last use 2015; last use sat marijuana6/22/19,   . Sexual activity: Yes  Other Topics Concern  . Not on file  Social History Narrative   Lives with Significant Other x 43 years   Social Determinants of Health   Financial Resource Strain: Not on file  Food  Insecurity: Not on file  Transportation Needs: Not on file  Physical Activity: Not on file  Stress: Not on file  Social Connections: Not on file  Intimate Partner Violence: Not on file      Review of Systems  Constitutional: Negative for chills, diaphoresis and fatigue.  HENT: Negative for ear pain, postnasal drip and sinus pressure.   Eyes: Negative for photophobia, discharge, redness, itching and visual disturbance.  Respiratory: Negative for cough, shortness of breath and wheezing.   Cardiovascular: Negative for chest pain, palpitations and leg swelling.  Gastrointestinal: Negative for abdominal pain, constipation, diarrhea, nausea and vomiting.  Genitourinary: Negative for dysuria and flank pain.  Musculoskeletal: Negative for arthralgias, back pain, gait problem and neck pain.  Skin: Negative for color change.  Allergic/Immunologic: Negative for environmental allergies and food allergies.  Neurological: Positive for dizziness. Negative for headaches.  Hematological: Does not bruise/bleed easily.  Psychiatric/Behavioral: Negative for agitation, behavioral problems (depression) and hallucinations.    Vital Signs: BP 138/88   Pulse 100   Temp (!) 97.4 F (36.3 C)   Resp 16   Ht 4\' 11"  (1.499 m)   Wt 102 lb 6.4 oz (46.4 kg)   SpO2 98%   BMI 20.68 kg/m    Physical Exam Vitals reviewed.  Constitutional:      Appearance: Normal appearance. She is normal weight.  Cardiovascular:     Rate and Rhythm: Normal rate and regular rhythm.     Pulses: Normal pulses.     Heart sounds: Normal heart sounds.  Pulmonary:     Effort: Pulmonary effort is normal.     Breath sounds: Normal breath sounds.  Skin:    General: Skin is warm.  Neurological:     General: No focal deficit present.     Mental Status: She is alert and oriented to person, place, and time. Mental status is at baseline.  Psychiatric:        Mood and Affect: Mood normal.        Behavior: Behavior normal.         Thought Content: Thought content normal.        Judgment: Judgment normal.    Assessment/Plan: 1. Essential hypertension Continue to withhold amlodipine at  this time, start taking lisinopril-HCTZ in the AM to possible avoid waking up feeling dizzy Advised to continue monitoring BP closely at home and to contact office for elevated or further low readings  2. Tachycardia HR slightly elevated today--advised to also closely monitor HR at home, if continues to remain elevated may need to add los dose beta blocker to regimen  General Counseling: Tinea verbalizes understanding of the findings of todays visit and agrees with plan of treatment. I have discussed any further diagnostic evaluation that may be needed or ordered today. We also reviewed her medications today. she has been encouraged to call the office with any questions or concerns that should arise related to todays visit.  Time spent: 30 Minutes Time spent includes review of chart, medications, test results and follow-up plan with the patient.  This patient was seen by Theodoro Grist AGNP-C in Collaboration with Dr [name] as a part of collaborative care agreement     Tanna Furry. Rue Tinnel AGNP-C Internal medicine

## 2020-08-17 ENCOUNTER — Encounter: Payer: Self-pay | Admitting: Hospice and Palliative Medicine

## 2020-08-17 DIAGNOSIS — I509 Heart failure, unspecified: Secondary | ICD-10-CM

## 2020-08-17 HISTORY — DX: Heart failure, unspecified: I50.9

## 2020-08-19 ENCOUNTER — Telehealth: Payer: Self-pay

## 2020-08-19 NOTE — Telephone Encounter (Signed)
Called to check on Pt's Bp and see how she is doing Per [employer].  LMOM

## 2020-08-22 ENCOUNTER — Other Ambulatory Visit: Payer: Self-pay

## 2020-08-22 ENCOUNTER — Ambulatory Visit
Admission: RE | Admit: 2020-08-22 | Discharge: 2020-08-22 | Disposition: A | Discharge status: Home / Self Care | Payer: Medicare Other | Source: Ambulatory Visit | Attending: Nurse Practitioner | Admitting: Nurse Practitioner

## 2020-08-22 DIAGNOSIS — Z1231 Encounter for screening mammogram for malignant neoplasm of breast: Secondary | ICD-10-CM

## 2020-08-22 MED ORDER — MELOXICAM 7.5 MG PO TABS: 7.5000 mg | ORAL_TABLET | Freq: Two times a day (BID) | ORAL | 2 refills | Status: DC

## 2020-08-22 MED ORDER — GABAPENTIN 100 MG PO CAPS: 100.0000 mg | ORAL_CAPSULE | Freq: Two times a day (BID) | ORAL | 3 refills | Status: DC

## 2020-08-28 ENCOUNTER — Other Ambulatory Visit: Payer: BLUE CROSS/BLUE SHIELD

## 2020-09-03 ENCOUNTER — Other Ambulatory Visit: Payer: Self-pay

## 2020-09-03 MED ORDER — MELOXICAM 7.5 MG PO TABS: 7.5000 mg | ORAL_TABLET | Freq: Two times a day (BID) | ORAL | 2 refills | Status: DC

## 2020-09-04 ENCOUNTER — Other Ambulatory Visit: Payer: Self-pay

## 2020-09-04 MED ORDER — MELOXICAM 7.5 MG PO TABS: 7.5000 mg | ORAL_TABLET | Freq: Two times a day (BID) | ORAL | 2 refills | Status: DC

## 2020-09-09 ENCOUNTER — Ambulatory Visit (INDEPENDENT_AMBULATORY_CARE_PROVIDER_SITE_OTHER): Payer: Medicare Other | Admitting: Internal Medicine

## 2020-09-09 ENCOUNTER — Encounter: Payer: Self-pay | Admitting: Internal Medicine

## 2020-09-09 VITALS — BP 122/70 | HR 110 | Temp 98.2°F | Resp 16 | Ht 58.5 in | Wt 106.4 lb

## 2020-09-09 DIAGNOSIS — J449 Chronic obstructive pulmonary disease, unspecified: Secondary | ICD-10-CM

## 2020-09-09 DIAGNOSIS — Z9981 Dependence on supplemental oxygen: Secondary | ICD-10-CM

## 2020-09-09 DIAGNOSIS — R0602 Shortness of breath: Secondary | ICD-10-CM

## 2020-09-09 DIAGNOSIS — J9611 Chronic respiratory failure with hypoxia: Secondary | ICD-10-CM

## 2020-09-09 DIAGNOSIS — C3431 Malignant neoplasm of lower lobe, right bronchus or lung: Secondary | ICD-10-CM

## 2020-09-09 NOTE — Progress Notes (Signed)
[facility] [place],  [address]  Pulmonary Sleep Medicine   Office Visit Note  Patient Name: Angela Jensen DOB: August 31, 1955 MRN [id number]  Date of Service: 09/09/2020  Complaints/HPI: SEVERE COPD. Patient has had no admissions to the hospital at this time she is at her baseline. PFT from September shows severe COPD. Patient is not smoking. No chest pain states she was in the hospital in November 2021. She has had COVID 19 vaccine. Cancer is being followed by cancer center. For repeat CT scan on Thursday.  ROS  General: (-) fever, (-) chills, (-) night sweats, (-) weakness Skin: (-) rashes, (-) itching,. Eyes: (-) visual changes, (-) redness, (-) itching. Nose and Sinuses: (-) nasal stuffiness or itchiness, (-) postnasal drip, (-) nosebleeds, (-) sinus trouble. Mouth and Throat: (-) sore throat, (-) hoarseness. Neck: (-) swollen glands, (-) enlarged thyroid, (-) neck pain. Respiratory: + cough, (-) bloody sputum, + shortness of breath, - wheezing. Cardiovascular: - ankle swelling, (-) chest pain. Lymphatic: (-) lymph node enlargement. Neurologic: (-) numbness, (-) tingling. Psychiatric: (-) anxiety, (-) depression   Current Medication: Outpatient Encounter Medications as of 09/09/2020  Medication Sig  . Accu-Chek FastClix Lancets MISC Use as directed twice a day diag E11.65  . albuterol (PROVENTIL) (2.5 MG/3ML) 0.083% nebulizer solution Take 3 mLs (2.5 mg total) by nebulization every 6 (six) hours as needed for wheezing.  Marland Kitchen amLODipine (NORVASC) 10 MG tablet Take 1 tablet (10 mg total) by mouth every morning. (Patient not taking: Reported on 09/09/2020)  . aspirin EC 81 MG tablet Take 1 tablet (81 mg total) by mouth daily.  Marland Kitchen buPROPion (WELLBUTRIN XL) 150 MG 24 hr tablet TAKE 1 TABLET BY MOUTH TWICE DAILY FOR SMOKING CESSATION  . clopidogrel (PLAVIX) 75 MG tablet Take 1 tablet by mouth once daily  . cyclobenzaprine (FLEXERIL) 5 MG tablet Take 1  tablet (5 mg total) by mouth 3 (three) times daily as needed.  . ferrous sulfate 324 MG TBEC Take 324 mg by mouth.  . gabapentin (NEURONTIN) 100 MG capsule Take 1 capsule (100 mg total) by mouth 2 (two) times daily.  Marland Kitchen glucose blood (ACCU-CHEK AVIVA PLUS) test strip Use as instructed to check blood sugars three times daily.  E11.65  . JANUVIA 25 MG tablet Take 1 tablet by mouth once daily  . lisinopril-hydrochlorothiazide (ZESTORETIC) 10-12.5 MG tablet Take 1 tablet by mouth every morning.  . meloxicam (MOBIC) 7.5 MG tablet Take 1 tablet (7.5 mg total) by mouth 2 (two) times daily.  . metFORMIN (GLUCOPHAGE-XR) 500 MG 24 hr tablet Take 1 tablet (500 mg total) by mouth daily with breakfast.  . mometasone-formoterol (DULERA) 200-5 MCG/ACT AERO Inhale 1 puff into the lungs 2 (two) times daily. Please fill as 90 day prescription  . polyethylene glycol-electrolytes (NULYTELY) 420 g solution Drink one 8 oz glass every 20 mins until entire container is finished starting at 5:00pm on 07/25/20  . potassium chloride (KLOR-CON) 10 MEQ tablet TAKE 1 TABLET BY MOUTH EVERY OTHER DAY FOR  LOW  POTASSIUM (Patient taking differently: Take 10 mEq by mouth every other day.)  . rOPINIRole (REQUIP) 0.5 MG tablet Take 1 tablet (0.5 mg total) by mouth 2 (two) times daily.  . rosuvastatin (CRESTOR) 10 MG tablet Take 1 tablet (10 mg total) by mouth at bedtime.  Marland Kitchen SPIRIVA HANDIHALER 18 MCG inhalation capsule INHALE THE CONTENTS OF ONE CAPSULE BY MOUTH ONCE DAILY. DO NOT SWALLOW CAPSULE.  . VENTOLIN HFA 108 (90 Base)  MCG/ACT inhaler INHALE 2 PUFFS INTO LUNGS EVERY 4 HOURS AS NEEDED FOR WHEEZING OR SHORTNESS OF BREATH   No facility-administered encounter medications on file as of 09/09/2020.    Surgical History: Past Surgical History:  Procedure Laterality Date  . CESAREAN SECTION     x3  . COLONOSCOPY WITH PROPOFOL N/A 06/25/2015   Procedure: COLONOSCOPY WITH PROPOFOL;  Surgeon: Lucilla Lame, MD;  Location: [facility];   Service: Endoscopy;  Laterality: N/A;  . COLONOSCOPY WITH PROPOFOL N/A 07/26/2020   Procedure: COLONOSCOPY WITH PROPOFOL;  Surgeon: Lucilla Lame, MD;  Location: [facility];  Service: Endoscopy;  Laterality: N/A;  . CYST REMOVAL LEG     and on shoulder   . ESOPHAGOGASTRODUODENOSCOPY (EGD) WITH PROPOFOL N/A 07/26/2020   Procedure: ESOPHAGOGASTRODUODENOSCOPY (EGD) WITH PROPOFOL;  Surgeon: Lucilla Lame, MD;  Location: [place];  Service: Endoscopy;  Laterality: N/A;  Diabetic - oral meds  . LOWER EXTREMITY ANGIOGRAPHY Left 09/29/2018   Procedure: LOWER EXTREMITY ANGIOGRAPHY;  Surgeon: Algernon Huxley, MD;  Location: [place] [facility];  Service: Cardiovascular;  Laterality: Left;  . LUNG BIOPSY  05 15 2013   has lung "spots"  . PERIPHERAL VASCULAR CATHETERIZATION Left 06/01/2016   Procedure: Lower Extremity Angiography;  Surgeon: Algernon Huxley, MD;  Location: [place] [facility];  Service: Cardiovascular;  Laterality: Left;  . PERIPHERAL VASCULAR CATHETERIZATION N/A 06/01/2016   Procedure: Abdominal Aortogram w/Lower Extremity;  Surgeon: Algernon Huxley, MD;  Location: [place] [facility];  Service: Cardiovascular;  Laterality: N/A;  . PERIPHERAL VASCULAR CATHETERIZATION  06/01/2016   Procedure: Lower Extremity Intervention;  Surgeon: Algernon Huxley, MD;  Location: [place] [facility];  Service: Cardiovascular;;  . PERIPHERAL VASCULAR CATHETERIZATION Right 06/08/2016   Procedure: Lower Extremity Angiography;  Surgeon: Algernon Huxley, MD;  Location: [place] [facility];  Service: Cardiovascular;  Laterality: Right;  . PERIPHERAL VASCULAR CATHETERIZATION  06/08/2016   Procedure: Lower Extremity Intervention;  Surgeon: Algernon Huxley, MD;  Location: [place] [facility];  Service: Cardiovascular;;    Medical History: Past Medical History:  Diagnosis Date  . Anemia   . Arthritis   . Asthma   . Atherosclerosis of native arteries of extremity with intermittent claudication ([place]) 05/26/2016   . Cancer [facility]) 2012   Right Lung CA  . COPD (chronic obstructive pulmonary disease) ([place])   . Depression   . Diabetes mellitus without complication [facility])    Patient takes Janumet  . Essential hypertension 05/26/2016  . Hypercholesteremia   . 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 PAD (peripheral artery disease) ([place]) 06/22/2016  . Peripheral vascular disease ([place])   . Personal history of radiation therapy   . Shortness of breath dyspnea    with exertion   . Sleep apnea   . Wears dentures    full upper and lower    Family History: Family History  Problem Relation Age of Onset  . Lung cancer Father   . Diabetes Mother   . Hypercholesterolemia Mother   . Diabetes Sister   . Diabetes Maternal Grandmother   . Diabetes Paternal Grandmother   . Diabetes Sister   . Heart attack Brother   . Coronary artery disease Brother   . Vascular Disease Brother   . Hypertension Sister   . Heart attack Brother     Social History: Social History   Socioeconomic History  . Marital status: Widowed    Spouse name: Not on file  .  Number of children: Not on file  . Years of education: Not on file  . Highest education level: Not on file  Occupational History  . Not on file  Tobacco Use  . Smoking status: Former Smoker    Packs/day: 1.00    Years: 37.00    Pack years: 37.00    Types: Cigarettes    Quit date: 02/06/2010    Years since quitting: 10.5  . Smokeless tobacco: Former Systems developer    Types: Snuff  Vaping Use  . Vaping Use: Never used  Substance and Sexual Activity  . Alcohol use: Not Currently    Alcohol/week: 5.0 standard drinks    Types: 5 Cans of beer per week    Comment: /h x of alcohol abuse -stopped 2012- now drinks 5 beer per w  . Drug use: Not Currently    Types: Marijuana, "Crack" cocaine, Cocaine    Comment: hx of cocaine use- last use 2015; last use sat marijuana6/22/19,   . Sexual activity: Yes  Other Topics Concern  . Not on file  Social History Narrative   Lives  with Significant Other x 43 years   Social Determinants of Health   Financial Resource Strain: Not on file  Food Insecurity: Not on file  Transportation Needs: Not on file  Physical Activity: Not on file  Stress: Not on file  Social Connections: Not on file  Intimate Partner Violence: Not on file    Vital Signs: Blood pressure 122/70, pulse (!) 110, temperature 98.2 F (36.8 C), resp. rate 16, height 4' 10.5" (1.486 m), weight 106 lb 6.4 oz (48.3 kg), SpO2 94 %.  Examination: General Appearance: The patient is well-developed, well-nourished, and in no distress. Skin: Gross inspection of skin unremarkable. Head: normocephalic, no gross deformities. Eyes: no gross deformities noted. ENT: ears appear grossly normal no exudates. Neck: Supple. No thyromegaly. No LAD. Respiratory: few distant rhonchi. Cardiovascular: Normal S1 and S2 without murmur or rub. Extremities: No cyanosis. pulses are equal. Neurologic: Alert and oriented. No involuntary movements.  LABS: Recent Results (from the past 2160 hour(s))  Urinalysis, Complete w Microscopic Urine, Clean Catch     Status: Abnormal   Collection Time: 06/30/20  1:25 PM  Result Value Ref Range   Color, Urine YELLOW (A) YELLOW   APPearance HAZY (A) CLEAR   Specific Gravity, Urine 1.023 1.005 - 1.030   pH 5.0 5.0 - 8.0   Glucose, UA NEGATIVE NEGATIVE mg/dL   Hgb urine dipstick NEGATIVE NEGATIVE   Bilirubin Urine NEGATIVE NEGATIVE   Ketones, ur 5 (A) NEGATIVE mg/dL   Protein, ur NEGATIVE NEGATIVE mg/dL   Nitrite NEGATIVE NEGATIVE   Leukocytes,Ua NEGATIVE NEGATIVE   RBC / HPF 0-5 0 - 5 RBC/hpf   WBC, UA 0-5 0 - 5 WBC/hpf   Bacteria, UA NONE SEEN NONE SEEN   Squamous Epithelial / LPF 0-5 0 - 5   Mucus PRESENT    Hyaline Casts, UA PRESENT     Comment: Performed at [facility], [address] [place], [place] [address]  CBC     Status: Abnormal   Collection Time: 07/22/20  2:33 PM  Result Value Ref Range   WBC 4.6  4.0 - 10.5 K/uL   RBC 4.07 3.87 - 5.11 MIL/uL   Hemoglobin 11.5 (L) 12.0 - 15.0 g/dL   HCT 36.5 36.0 - 46.0 %   MCV 89.7 80.0 - 100.0 fL   MCH 28.3 26.0 - 34.0 pg   MCHC 31.5 30.0 -  36.0 g/dL   RDW 15.0 11.5 - 15.5 %   Platelets 323 150 - 400 K/uL   nRBC 0.0 0.0 - 0.2 %    Comment: Performed at [facility], [address] [place], [place] [address]  SARS CORONAVIRUS 2 (TAT 6-24 HRS)     Status: None   Collection Time: 07/24/20 12:05 PM  Result Value Ref Range   SARS Coronavirus 2 NEGATIVE NEGATIVE    Comment: (NOTE) SARS-CoV-2 target nucleic acids are NOT DETECTED.  The SARS-CoV-2 RNA is generally detectable in upper and lower respiratory specimens during the acute phase of infection. Negative results do not preclude SARS-CoV-2 infection, do not rule out co-infections with other pathogens, and should not be used as the sole basis for treatment or other patient management decisions. Negative results must be combined with clinical observations, patient history, and epidemiological information. The expected result is Negative.  Fact Sheet for Patients: SugarRoll.be  Fact Sheet for Healthcare Providers: https://www.woods-mathews.com/  This test is not yet approved or cleared by the [place] FDA and  has been authorized for detection and/or diagnosis of SARS-CoV-2 by FDA under an Emergency Use Authorization (EUA). This EUA will remain  in effect (meaning this test can be used) for the duration of the COVID-19 declaration under Se ction 564(b)(1) of the Act, 21 U.S.C. section 360bbb-3(b)(1), unless the authorization is terminated or revoked sooner.  Performed at [facility], [place] [address], [place], [place] [address]   Glucose, capillary     Status: Abnormal   Collection Time: 07/26/20  7:22 AM  Result Value Ref Range   Glucose-Capillary 117 (H) 70 - 99 mg/dL    Comment: Glucose reference range applies only  to samples taken after fasting for at least 8 hours.  Glucose, capillary     Status: Abnormal   Collection Time: 07/26/20  8:23 AM  Result Value Ref Range   Glucose-Capillary 111 (H) 70 - 99 mg/dL    Comment: Glucose reference range applies only to samples taken after fasting for at least 8 hours.  UA/M w/rflx Culture, Routine     Status: Abnormal   Collection Time: 07/29/20 11:37 AM   Specimen: Urine   Urine  Result Value Ref Range   Specific Gravity, UA 1.014 1.005 - 1.030   pH, UA 5.0 5.0 - 7.5   Color, UA Yellow Yellow   Appearance Ur Clear Clear   Leukocytes,UA Negative Negative   Protein,UA Negative Negative/Trace   Glucose, UA Negative Negative   Ketones, UA Negative Negative   RBC, UA 1+ (A) Negative   Bilirubin, UA Negative Negative   Urobilinogen, Ur 0.2 0.2 - 1.0 mg/dL   Nitrite, UA Negative Negative   Microscopic Examination See below:     Comment: Microscopic was indicated and was performed.   Urinalysis Reflex Comment     Comment: This specimen will not reflex to a Urine Culture.  Microscopic Examination     Status: None   Collection Time: 07/29/20 11:37 AM   Urine  Result Value Ref Range   WBC, UA None seen 0 - 5 /hpf   RBC None seen 0 - 2 /hpf   Epithelial Cells (non renal) 0-10 0 - 10 /hpf   Casts None seen None seen /lpf   Bacteria, UA None seen None seen/Few  POCT HgB A1C     Status: None   Collection Time: 07/29/20 12:18 PM  Result Value Ref Range   Hemoglobin A1C 5.4 4.0 -  5.6 %   HbA1c POC (<> result, manual entry)     HbA1c, POC (prediabetic range)     HbA1c, POC (controlled diabetic range)      Radiology: MM 3D SCREEN BREAST BILATERAL  Result Date: 08/22/2020 CLINICAL DATA:  Screening. EXAM: DIGITAL SCREENING BILATERAL MAMMOGRAM WITH TOMO AND CAD COMPARISON:  Previous exam(s). ACR Breast Density Category b: There are scattered areas of fibroglandular density. FINDINGS: There are no findings suspicious for malignancy. Images were processed with  CAD. IMPRESSION: No mammographic evidence of malignancy. A result letter of this screening mammogram will be mailed directly to the patient. RECOMMENDATION: Screening mammogram in one year. (Code:SM-B-01Y) BI-RADS CATEGORY  1: Negative. Electronically Signed   By: Dorise Bullion III M.D   On: 08/22/2020 13:19    No results found.  MM 3D SCREEN BREAST BILATERAL  Result Date: 08/22/2020 CLINICAL DATA:  Screening. EXAM: DIGITAL SCREENING BILATERAL MAMMOGRAM WITH TOMO AND CAD COMPARISON:  Previous exam(s). ACR Breast Density Category b: There are scattered areas of fibroglandular density. FINDINGS: There are no findings suspicious for malignancy. Images were processed with CAD. IMPRESSION: No mammographic evidence of malignancy. A result letter of this screening mammogram will be mailed directly to the patient. RECOMMENDATION: Screening mammogram in one year. (Code:SM-B-01Y) BI-RADS CATEGORY  1: Negative. Electronically Signed   By: Dorise Bullion III M.D   On: 08/22/2020 13:19      Assessment and Plan: Patient Active Problem List   Diagnosis Date Noted  . Pneumothorax after biopsy 05/03/2020  . Pneumothorax on right 05/01/2020  . Chest pain on breathing 04/21/2020  . Pleuritic chest pain 04/11/2020  . Right lower lobe pulmonary nodule 04/02/2020  . Iron deficiency anemia 04/02/2020  . Pica in adults 04/02/2020  . Goals of care, counseling/discussion 03/14/2020  . Acute non-recurrent pansinusitis 01/01/2020  . Pain in left wrist 09/24/2019  . Encounter for general adult medical examination with abnormal findings 06/28/2019  . Elevated total protein 01/22/2019  . Uncontrolled type 2 diabetes mellitus with hyperglycemia ([place]) 05/16/2018  . Chronic obstructive pulmonary disease ([place]) 05/16/2018  . Vitamin D deficiency 05/16/2018  . Flu vaccine need 05/16/2018  . Dysuria 05/16/2018  . Cancer of lower lobe of right lung ([place]) 01/21/2018  . Screening for breast cancer 10/12/2017  . Personal  history of tobacco use, presenting hazards to health 02/03/2017  . PAD (peripheral artery disease) ([place]) 06/22/2016  . Essential hypertension 05/26/2016  . Diabetes ([place]) 05/26/2016  . Hyperlipidemia 05/26/2016  . Atherosclerosis of native arteries of extremity with intermittent claudication ([place]) 05/26/2016  . Uterine leiomyoma 04/30/2016  . Elevated CEA 01/31/2016  . Special screening for malignant neoplasms, colon   . Benign neoplasm of sigmoid colon   . Primary lung cancer ([place])   . Malignant neoplasm of upper lobe of right lung ([place])     1. COPD severe disease using her inhalers as prescribed. Will continue scripts are up todate 2. Lung cancer for follow up CT to be done at the cancer center 3. Sleep APnea she uses Oxygen only at night 4. Chronic respiratory failure on oxygen therapy  General Counseling: I have discussed the findings of the evaluation and examination with [facility].  I have also discussed any further diagnostic evaluation thatmay be needed or ordered today. Allura verbalizes understanding of the findings of todays visit. We also reviewed her medications today and discussed drug interactions and side effects including but not limited excessive drowsiness and altered mental states. We also discussed that there is  always a risk not just to her but also people around her. she has been encouraged to call the office with any questions or concerns that should arise related to todays visit.  Orders Placed This Encounter  Procedures  . Spirometry with Graph    Order Specific Question:   Where should this test be performed?    Answer:   [facility]    Order Specific Question:   Basic spirometry    Answer:   Yes    Order Specific Question:   Spirometry pre & post bronchodilator    Answer:   No     Time spent: 9  I have personally obtained a history, examined the patient, evaluated laboratory and imaging results, formulated the assessment and plan and placed  orders.    Allyne Gee, MD [facility] Pulmonary and Critical Care Sleep medicine

## 2020-09-09 NOTE — Patient Instructions (Signed)
Chronic Obstructive Pulmonary Disease  Chronic obstructive pulmonary disease (COPD) is a long-term (chronic) lung problem. When you have COPD, it is hard for air to get in and out of your lungs. Usually the condition gets worse over time, and your lungs will never return to normal. There are things you can do to keep yourself as healthy as possible. What are the causes?  Smoking. This is the most common cause.  Certain genes passed from parent to child (inherited). What increases the risk?  Being exposed to secondhand smoke from cigarettes, pipes, or cigars.  Being exposed to chemicals and other irritants, such as fumes and dust in the work environment.  Having chronic lung conditions or infections. What are the signs or symptoms?  Shortness of breath, especially during physical activity.  A long-term cough with a large amount of thick mucus. Sometimes, the cough may not have any mucus (dry cough).  Wheezing.  Breathing quickly.  Skin that looks gray or blue, especially in the fingers, toes, or lips.  Feeling tired (fatigue).  Weight loss.  Chest tightness.  Having infections often.  Episodes when breathing symptoms become much worse (exacerbations). At the later stages of this disease, you may have swelling in the ankles, feet, or legs. How is this treated?  Taking medicines.  Quitting smoking, if you smoke.  Rehabilitation. This includes steps to make your body work better. It may involve a team of specialists.  Doing exercises.  Making changes to your diet.  Using oxygen.  Lung surgery.  Lung transplant.  Comfort measures (palliative care). Follow these instructions at home: Medicines  Take over-the-counter and prescription medicines only as told by your doctor.  Talk to your doctor before taking any cough or allergy medicines. You may need to avoid medicines that cause your lungs to be dry. Lifestyle  If you smoke, stop smoking. Smoking makes the  problem worse.  Do not smoke or use any products that contain nicotine or tobacco. If you need help quitting, ask your doctor.  Avoid being around things that make your breathing worse. This may include smoke, chemicals, and fumes.  Stay active, but remember to rest as well.  Learn and use tips on how to manage stress and control your breathing.  Make sure you get enough sleep. Most adults need at least 7 hours of sleep every night.  Eat healthy foods. Eat smaller meals more often. Rest before meals. Controlled breathing Learn and use tips on how to control your breathing as told by your doctor. Try:  Breathing in (inhaling) through your nose for 1 second. Then, pucker your lips and breath out (exhale) through your lips for 2 seconds.  Putting one hand on your belly (abdomen). Breathe in slowly through your nose for 1 second. Your hand on your belly should move out. Pucker your lips and breathe out slowly through your lips. Your hand on your belly should move in as you breathe out.   Controlled coughing Learn and use controlled coughing to clear mucus from your lungs. Follow these steps: 1. Lean your head a little forward. 2. Breathe in deeply. 3. Try to hold your breath for 3 seconds. 4. Keep your mouth slightly open while coughing 2 times. 5. Spit any mucus out into a tissue. 6. Rest and do the steps again 1 or 2 times as needed. General instructions  Make sure you get all the shots (vaccines) that your doctor recommends. Ask your doctor about a flu shot and a pneumonia shot.    Use oxygen therapy and pulmonary rehabilitation if told by your doctor. If you need home oxygen therapy, ask your doctor if you should buy a tool to measure your oxygen level (oximeter).  Make a COPD action plan with your doctor. This helps you to know what to do if you feel worse than usual.  Manage any other conditions you have as told by your doctor.  Avoid going outside when it is very hot, cold, or  humid.  Avoid people who have a sickness you can catch (contagious).  Keep all follow-up visits. Contact a doctor if:  You cough up more mucus than usual.  There is a change in the color or thickness of the mucus.  It is harder to breathe than usual.  Your breathing is faster than usual.  You have trouble sleeping.  You need to use your medicines more often than usual.  You have trouble doing your normal activities such as getting dressed or walking around the house. Get help right away if:  You have shortness of breath while resting.  You have shortness of breath that stops you from: ? Being able to talk. ? Doing normal activities.  Your chest hurts for longer than 5 minutes.  Your skin color is more blue than usual.  Your pulse oximeter shows that you have low oxygen for longer than 5 minutes.  You have a fever.  You feel too tired to breathe normally. These symptoms may represent a serious problem that is an emergency. Do not wait to see if the symptoms will go away. Get medical help right away. Call your local emergency services (911 in the [place] Do not drive yourself to the hospital. Summary  Chronic obstructive pulmonary disease (COPD) is a long-term lung problem.  The way your lungs work will never return to normal. Usually the condition gets worse over time. There are things you can do to keep yourself as healthy as possible.  Take over-the-counter and prescription medicines only as told by your doctor.  If you smoke, stop. Smoking makes the problem worse. This information is not intended to replace advice given to you by your health care provider. Make sure you discuss any questions you have with your health care provider. Document Revised: 06/11/2020 Document Reviewed: 06/11/2020 Elsevier Patient Education  2021 [employer].   

## 2020-09-11 ENCOUNTER — Ambulatory Visit
Admission: RE | Admit: 2020-09-11 | Discharge: 2020-09-11 | Disposition: A | Discharge status: Home / Self Care | Payer: Medicare Other | Source: Ambulatory Visit | Attending: Hematology and Oncology | Admitting: Hematology and Oncology

## 2020-09-11 ENCOUNTER — Other Ambulatory Visit: Payer: Self-pay

## 2020-09-11 ENCOUNTER — Ambulatory Visit: Payer: Medicare Other

## 2020-09-11 DIAGNOSIS — C3411 Malignant neoplasm of upper lobe, right bronchus or lung: Secondary | ICD-10-CM | POA: Diagnosis present

## 2020-09-11 DIAGNOSIS — E041 Nontoxic single thyroid nodule: Secondary | ICD-10-CM | POA: Diagnosis not present

## 2020-09-11 DIAGNOSIS — C3431 Malignant neoplasm of lower lobe, right bronchus or lung: Secondary | ICD-10-CM | POA: Insufficient documentation

## 2020-09-11 NOTE — Progress Notes (Signed)
[facility]  [address] [place], [place] [address] Phone: [phone]  Fax: [phone]   Clinic Day:  09/12/2020  Referring physician: Ronnell Freshwater, NP  Chief Complaint: Angela Jensen is a 65 y.o. female with a history of stage I lung cancer s/p radiationand iron deficiency anemia who is seen for 3 month assessment and review of interval scans.  HPI: The patient was last seen in the medical oncology clinic on 05/28/2020. At that time, she felt "fine." Her back pain had resolved. She was eating a variety of iron-rich foods and taking oral iron BID.  She was still using oxygen at night. Exam was stable. Hematocrit was 34.8, hemoglobin 10.2, MCV 85.1, platelets 364,000, WBC 5,600. Ferritin was 20.  The patient received SBRT treatment to the right lung from 05/28/2020 - 06/11/2020. She received 60 Gy in 5 fractions.  The patient presented to the ER on 06/30/2020 after a fall the day before. She got dizzy and lost her balance while standing up. She woke up with increased pain and a burning sensation to the posterior flank. There was no radiologic evidence of fracture or dislocation. UA revealed no hematuria. Her symptoms improved after receiving medication in the ER and she was discharged on a muscle relaxant and pain medication.  Colonoscopy on 07/26/2020 by Dr. Allen Norris revealed diverticulosis in the entire examined colon. There were non-bleeding internal hemorrhoids. EGD revealed erosive gastropathy with no bleeding and no stigmata of recent bleeding. No specimens were collected.   Screening mammogram on 08/22/2020 revealed no evidence of malignancy.  Thyroid ultrasound at [facility] on 09/11/2020 revealed a 0.3 cm cyst in the upper portion of the right lobe and a 0.8 cm hypoechoic nodule in the mid to lower portion of the left lobe.  Chest CT without contrast on 09/11/2020 revealed interval treatment of the right lower lobe nodules. These nodules  were partly obscured by adjacent parenchymal opacity, although appear decreased in size. Continued follow-up recommended.  There was stable chronic sequela of radiation therapy in the right upper lobe and along the minor fissure.  There was no new or enlarging nodules. There was no adenopathy or pleural effusion.  There was a stable right 7th rib fracture without pathologic components or interval healing.  There was aortic atherosclerosis and emphysema  CBC on 07/22/2020 revealed a hematocrit of 36.5, hemoglobin 11.5, MCV 89.7, platelets 323,000, WBC 4,600.  During the interim, she has been "good." She is taking oral iron BID. She denies blood in the stool and black stool. Her blood sugar has been good.  Radiation went well. She denies bone pain, increased shortness of breath, and cough. She is still using oxygen at night.   Past Medical History:  Diagnosis Date  . Anemia   . Arthritis   . Asthma   . Atherosclerosis of native arteries of extremity with intermittent claudication ([place]) 05/26/2016  . Cancer [facility]) 2012   Right Lung CA  . COPD (chronic obstructive pulmonary disease) ([place])   . Depression   . Diabetes mellitus without complication [facility])    Patient takes Janumet  . Essential hypertension 05/26/2016  . Hypercholesteremia   . 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 PAD (peripheral artery disease) ([place]) 06/22/2016  . Peripheral vascular disease ([place])   . Personal history of radiation therapy   . Shortness of breath dyspnea    with exertion   . Sleep apnea   . Wears dentures  full upper and lower    Past Surgical History:  Procedure Laterality Date  . CESAREAN SECTION     x3  . COLONOSCOPY WITH PROPOFOL N/A 06/25/2015   Procedure: COLONOSCOPY WITH PROPOFOL;  Surgeon: Lucilla Lame, MD;  Location: [facility] ENDOSCOPY;  Service: Endoscopy;  Laterality: N/A;  . COLONOSCOPY WITH PROPOFOL N/A 07/26/2020   Procedure: COLONOSCOPY WITH PROPOFOL;  Surgeon: Lucilla Lame, MD;  Location:  [place];  Service: Endoscopy;  Laterality: N/A;  . CYST REMOVAL LEG     and on shoulder   . ESOPHAGOGASTRODUODENOSCOPY (EGD) WITH PROPOFOL N/A 07/26/2020   Procedure: ESOPHAGOGASTRODUODENOSCOPY (EGD) WITH PROPOFOL;  Surgeon: Lucilla Lame, MD;  Location: [place];  Service: Endoscopy;  Laterality: N/A;  Diabetic - oral meds  . LOWER EXTREMITY ANGIOGRAPHY Left 09/29/2018   Procedure: LOWER EXTREMITY ANGIOGRAPHY;  Surgeon: Algernon Huxley, MD;  Location: [place] CV LAB;  Service: Cardiovascular;  Laterality: Left;  . LUNG BIOPSY  05 15 2013   has lung "spots"  . PERIPHERAL VASCULAR CATHETERIZATION Left 06/01/2016   Procedure: Lower Extremity Angiography;  Surgeon: Algernon Huxley, MD;  Location: [place] [facility];  Service: Cardiovascular;  Laterality: Left;  . PERIPHERAL VASCULAR CATHETERIZATION N/A 06/01/2016   Procedure: Abdominal Aortogram w/Lower Extremity;  Surgeon: Algernon Huxley, MD;  Location: [place] [facility];  Service: Cardiovascular;  Laterality: N/A;  . PERIPHERAL VASCULAR CATHETERIZATION  06/01/2016   Procedure: Lower Extremity Intervention;  Surgeon: Algernon Huxley, MD;  Location: [place] CV [facility];  Service: Cardiovascular;;  . PERIPHERAL VASCULAR CATHETERIZATION Right 06/08/2016   Procedure: Lower Extremity Angiography;  Surgeon: Algernon Huxley, MD;  Location: [place] CV [facility];  Service: Cardiovascular;  Laterality: Right;  . PERIPHERAL VASCULAR CATHETERIZATION  06/08/2016   Procedure: Lower Extremity Intervention;  Surgeon: Algernon Huxley, MD;  Location: [place] CV [facility];  Service: Cardiovascular;;    Family History  Problem Relation Age of Onset  . Lung cancer Father   . Diabetes Mother   . Hypercholesterolemia Mother   . Diabetes Sister   . Diabetes Maternal Grandmother   . Diabetes Paternal Grandmother   . Diabetes Sister   . Heart attack Brother   . Coronary artery disease Brother   . Vascular Disease Brother   . Hypertension Sister   .  Heart attack Brother     Social History:  reports that she quit smoking about 10 years ago. Her smoking use included cigarettes. She has a 37.00 pack-year smoking history. She has quit using smokeless tobacco.  Her smokeless tobacco use included snuff. She reports previous alcohol use of about 5.0 standard drinks of alcohol per week. She reports previous drug use. Drugs: Marijuana, "Crack" cocaine, and Cocaine. She started smoking at 17 until she was "19 something". Her boyfriend is [name]: [phone]. She lives in [place]. The patient is alone today.  Allergies: No Known Allergies  Current Medications: Current Outpatient Medications  Medication Sig Dispense Refill  . Accu-Chek FastClix Lancets MISC Use as directed twice a day diag E11.65 100 each 1  . albuterol (PROVENTIL) (2.5 MG/3ML) 0.083% nebulizer solution Take 3 mLs (2.5 mg total) by nebulization every 6 (six) hours as needed for wheezing. 75 mL 3  . aspirin EC 81 MG tablet Take 1 tablet (81 mg total) by mouth daily. 150 tablet 2  . buPROPion (WELLBUTRIN XL) 150 MG 24 hr tablet TAKE 1 TABLET BY MOUTH TWICE DAILY FOR SMOKING CESSATION 60 tablet 6  . clopidogrel (  PLAVIX) 75 MG tablet Take 1 tablet by mouth once daily 90 tablet 0  . cyclobenzaprine (FLEXERIL) 5 MG tablet Take 1 tablet (5 mg total) by mouth 3 (three) times daily as needed. 15 tablet 0  . ferrous sulfate 324 MG TBEC Take 324 mg by mouth.    . gabapentin (NEURONTIN) 100 MG capsule Take 1 capsule (100 mg total) by mouth 2 (two) times daily. 60 capsule 3  . glucose blood (ACCU-CHEK AVIVA PLUS) test strip Use as instructed to check blood sugars three times daily.  E11.65 250 each 5  . JANUVIA 25 MG tablet Take 1 tablet by mouth once daily 90 tablet 0  . lisinopril-hydrochlorothiazide (ZESTORETIC) 10-12.5 MG tablet Take 1 tablet by mouth every morning. 30 tablet 5  . meloxicam (MOBIC) 7.5 MG tablet Take 1 tablet (7.5 mg total) by mouth 2 (two) times daily. 60 tablet 2   . metFORMIN (GLUCOPHAGE-XR) 500 MG 24 hr tablet Take 1 tablet (500 mg total) by mouth daily with breakfast. 30 tablet 3  . mometasone-formoterol (DULERA) 200-5 MCG/ACT AERO Inhale 1 puff into the lungs 2 (two) times daily. Please fill as 90 day prescription 39 g 1  . polyethylene glycol-electrolytes (NULYTELY) 420 g solution Drink one 8 oz glass every 20 mins until entire container is finished starting at 5:00pm on 07/25/20 4000 mL 0  . potassium chloride (KLOR-CON) 10 MEQ tablet TAKE 1 TABLET BY MOUTH EVERY OTHER DAY FOR  LOW  POTASSIUM (Patient taking differently: Take 10 mEq by mouth every other day.) 45 tablet 3  . rOPINIRole (REQUIP) 0.5 MG tablet Take 1 tablet (0.5 mg total) by mouth 2 (two) times daily. 180 tablet 0  . rosuvastatin (CRESTOR) 10 MG tablet Take 1 tablet (10 mg total) by mouth at bedtime. 30 tablet 5  . SPIRIVA HANDIHALER 18 MCG inhalation capsule INHALE THE CONTENTS OF ONE CAPSULE BY MOUTH ONCE DAILY. DO NOT SWALLOW CAPSULE. 90 capsule 0  . VENTOLIN HFA 108 (90 Base) MCG/ACT inhaler INHALE 2 PUFFS INTO LUNGS EVERY 4 HOURS AS NEEDED FOR WHEEZING OR SHORTNESS OF BREATH 18 g 3  . amLODipine (NORVASC) 10 MG tablet Take 1 tablet (10 mg total) by mouth every morning. (Patient not taking: No sig reported) 30 tablet 5   No current facility-administered medications for this visit.    Review of Systems  Constitutional: Positive for weight loss (8 lbs). Negative for chills, diaphoresis, fever and malaise/fatigue.       Feels "good."  HENT: Negative for congestion, ear discharge, ear pain, hearing loss, nosebleeds, sinus pain, sore throat and tinnitus.   Eyes: Negative.  Negative for blurred vision.  Respiratory: Negative for cough, hemoptysis, sputum production and shortness of breath.        Uses oxygen at night.  Cardiovascular: Negative for chest pain, palpitations and leg swelling.  Gastrointestinal: Negative.  Negative for abdominal pain, blood in stool, constipation, diarrhea,  heartburn, melena, nausea and vomiting.  Genitourinary: Negative.  Negative for dysuria, frequency, hematuria and urgency.  Musculoskeletal: Negative for back pain, joint pain, myalgias and neck pain.  Skin: Negative.  Negative for itching and rash.  Neurological: Negative.  Negative for dizziness, tingling, sensory change, speech change, focal weakness, weakness and headaches.  Endo/Heme/Allergies: Negative.  Does not bruise/bleed easily.       Diabetes, under good control  Psychiatric/Behavioral: Negative.  Negative for depression and memory loss. The patient is not nervous/anxious and does not have insomnia.   All other systems reviewed and are  negative.   Performance status (ECOG): 0-1  Vitals:  Blood pressure (!) 145/79, pulse 99, temperature 98 F (36.7 C), resp. rate 20, weight 106 lb 11.2 oz (48.4 kg), SpO2 98 %.  Physical Exam Vitals and nursing note reviewed.  Constitutional:      General: She is not in acute distress.    Appearance: She is not diaphoretic.  HENT:     Head: Atraumatic.     Comments: Dark hair.    Mouth/Throat:     Mouth: Mucous membranes are moist.     Pharynx: Oropharynx is clear.  Eyes:     General: No scleral icterus.    Conjunctiva/sclera: Conjunctivae normal.     Comments: Glasses.  Brown eyes.  Cardiovascular:     Rate and Rhythm: Normal rate and regular rhythm.     Pulses: Normal pulses.     Heart sounds: Normal heart sounds.  Pulmonary:     Effort: Pulmonary effort is normal. No respiratory distress.     Breath sounds: Normal breath sounds. No stridor. No wheezing, rhonchi or rales.  Chest:  Breasts:     Right: No axillary adenopathy or supraclavicular adenopathy.     Left: No axillary adenopathy or supraclavicular adenopathy.    Abdominal:     General: There is no distension.     Palpations: Abdomen is soft. There is no hepatomegaly or splenomegaly.     Tenderness: There is no abdominal tenderness.  Musculoskeletal:        General:  No swelling.  Lymphadenopathy:     Head:     Right side of head: No preauricular, posterior auricular or occipital adenopathy.     Left side of head: No preauricular, posterior auricular or occipital adenopathy.     Cervical: No cervical adenopathy.     Upper Body:     Right upper body: No supraclavicular or axillary adenopathy.     Left upper body: No supraclavicular or axillary adenopathy.     Lower Body: No right inguinal adenopathy. No left inguinal adenopathy.  Skin:    General: Skin is warm and dry.  Neurological:     Mental Status: She is alert and oriented to person, place, and time.  Psychiatric:        Behavior: Behavior normal.        Thought Content: Thought content normal.        Judgment: Judgment normal.    Imaging studies: 07/31/2011:  Chest CT revealed a 1.1 x 0.6 cm RUL nodule and a 9 mm inferior aspect RUL nodule and a 4 mm nodule in the anterior aspect of the inferior aspect of the RUL. 08/03/2011:  PET scan revealed a 10-mm spiculated right apical pulmonary nodule (SUV 2.5) suspcious for lung cancer. Left ovary was prominent (3.9 x 2.4 cm).  01/30/2015:  PET scanrevealed a hypermetabolic and enlarging RUL pulmonary nodules concerning for lung cancer recurrence. There was a small focus of peripheral nodular consolidation in the superior segment of the right lower lobe with differential including inflammation/infection versus neoplasm. There was no evidence of mediastinal nodal metastasis.  03/27/2016:  PET scanrevealed no hypermetabolic activity to suggest recurrent malignancy. There was a soft tissue density in the left pelvis near the inguinal ring that was not hypermetabolic, but prominent and probably separate from the left ovary. This could be a wandering fibroid. Although the overall appearance had not changed from 2012 and was not hypermetabolic, pelvic sonography or pelvic MRI for further characterization of the uterus and ovaries  can be  considered. 08/01/2015:  Chest CTrevealed complete resolution of the prior nodule in the right lung apex.  01/31/2016:  Chest CTrevealed no active cardiopulmonary abnormalities. There was stable appearance of right apical and right middle lobe scar like densities.  02/02/2017:  Low dose chest CTrevealed new ground-glass nodule in the right lower lobe. Lung-RADS 3 (probably benign findings).  07/29/2017:  Low dose chest CT revealed enlargement of the previously observed sub-solid pulmonary lesion. RIGHT upper lobe lesion measured 1.4 x 2.1 x 1.6 cm (previously 1.5 x 1.1 x 1.7 cm). There was also areas of focal architectural distortion in the RIGHT upper and middle lobes that were felt to be most compatible with areas of chronic post infectious or inflammatory scarring. Aortic value calcifications were noted. Lung-RADS 4BS (suspicious).  01/19/2018:  Chest CTrevealed a subsolid 2.1 cm peripheral right lower lobe pulmonary nodule. Although not appreciably changed in size, subjectively this nodule had increased in density, and remains suspicious for primary bronchogenic adenocarcinoma. There was no thoracic adenopathy. 07/18/2018:  Chest CT revealed early postradiation change in the peripheral right lower lobe at the site of the treated tumor, with no residual measurable pulmonary nodule in this location.There were no findings of metastatic disease in the chest. 01/17/2019:  Chest CT without contrast revealed decreased subpleural reticulation at the SBRT site in the RIGHT lower lobe consistent with improved radiation change. There was residual nodularity along the fissure at this site with recommendation for close attention on follow-up. There was stable pulmonary scarring in the right upper lobe and right middle lobe. There was centrilobular emphysema. 07/19/2019:  Chest CT revealed a 6 x 6 mm nodule in the right lower lobe had increased in size from 3 mm previously. The nodule was separate and  discrete from the previous treatment beds. Metachronous or metastatic disease a concern. Close CT follow up was recommended. There was a 5 mm right lower lobe pulmonary nodule new in the interval. Close attention on follow up was recommended. There was stable appearance of parenchymal scarring in the right lung period.  03/04/2020:  Chest CT revealed enlarging and new pulmonary nodules suspicious for metastatic disease or metachronous primary.  03/19/2020:  PET scan revealed that the right lower lobe pulmonary nodules did not demonstrate any hypermetabolism to suggest neoplasm. However, these were somewhat worrisome; recommendation was close CT surveillance. There was no mediastinal or hilar lymphadenopathy. There were stable right lung scarring changes and significant underlying emphysema. 09/11/2020:  Chest CT without contrast revealed interval treatment of the right lower lobe nodules. These nodules were partly obscured by adjacent parenchymal opacity, although appear decreased in size. Continued follow-up recommended.  There was stable chronic sequela of radiation therapy in the right upper lobe and along the minor fissure.  There was no new or enlarging nodules. There was no adenopathy or pleural effusion.  There was a stable right 7th rib fracture without pathologic components or interval healing.  There was aortic atherosclerosis and emphysema   Appointment on 09/12/2020  Component Date Value Ref Range Status  . Sodium 09/12/2020 137  135 - 145 mmol/L Final  . Potassium 09/12/2020 3.5  3.5 - 5.1 mmol/L Final  . Chloride 09/12/2020 98  98 - 111 mmol/L Final  . CO2 09/12/2020 30  22 - 32 mmol/L Final  . Glucose, Bld 09/12/2020 148* 70 - 99 mg/dL Final   Glucose reference range applies only to samples taken after fasting for at least 8 hours.  . BUN 09/12/2020 16  8 -  23 mg/dL Final  . Creatinine, Ser 09/12/2020 0.71  0.44 - 1.00 mg/dL Final  . Calcium 09/12/2020 8.8* 8.9 - 10.3 mg/dL Final  .  Total Protein 09/12/2020 7.4  6.5 - 8.1 g/dL Final  . Albumin 09/12/2020 3.7  3.5 - 5.0 g/dL Final  . AST 09/12/2020 38  15 - 41 U/L Final  . ALT 09/12/2020 28  0 - 44 U/L Final  . Alkaline Phosphatase 09/12/2020 75  38 - 126 U/L Final  . Total Bilirubin 09/12/2020 0.5  0.3 - 1.2 mg/dL Final  . GFR, Estimated 09/12/2020 >60  >60 mL/min Final   Comment: (NOTE) Calculated using the CKD-EPI Creatinine Equation (2021)   . Anion gap 09/12/2020 9  5 - 15 Final   Performed at [facility], [address], [place], [place] [address]  . WBC 09/12/2020 4.0  4.0 - 10.5 K/uL Final  . RBC 09/12/2020 4.17  3.87 - 5.11 MIL/uL Final  . Hemoglobin 09/12/2020 11.4* 12.0 - 15.0 g/dL Final  . HCT 09/12/2020 37.2  36.0 - 46.0 % Final  . MCV 09/12/2020 89.2  80.0 - 100.0 fL Final  . MCH 09/12/2020 27.3  26.0 - 34.0 pg Final  . MCHC 09/12/2020 30.6  30.0 - 36.0 g/dL Final  . RDW 09/12/2020 15.9* 11.5 - 15.5 % Final  . Platelets 09/12/2020 249  150 - 400 K/uL Final  . nRBC 09/12/2020 0.0  0.0 - 0.2 % Final  . Neutrophils Relative % 09/12/2020 68  % Final  . Neutro Abs 09/12/2020 2.8  1.7 - 7.7 K/uL Final  . Lymphocytes Relative 09/12/2020 16  % Final  . Lymphs Abs 09/12/2020 0.6* 0.7 - 4.0 K/uL Final  . Monocytes Relative 09/12/2020 13  % Final  . Monocytes Absolute 09/12/2020 0.5  0.1 - 1.0 K/uL Final  . Eosinophils Relative 09/12/2020 2  % Final  . Eosinophils Absolute 09/12/2020 0.1  0.0 - 0.5 K/uL Final  . Basophils Relative 09/12/2020 1  % Final  . Basophils Absolute 09/12/2020 0.0  0.0 - 0.1 K/uL Final  . Immature Granulocytes 09/12/2020 0  % Final  . Abs Immature Granulocytes 09/12/2020 0.01  0.00 - 0.07 K/uL Final   Performed at [facility], [address] [place], [place] [address]    Assessment:  Angela Jensen is a 65 y.o. 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 She was not a good surgery candidate secondary to  her lung function (FEV1 28%).  PET scan on 08/03/2011 revealed a 10-mm spiculated right apical pulmonary nodule (SUV 2.5) suspcious for lung cancer. Left ovary was prominent (3.9 x 2.4 cm). CA-125 was 9.3 (normal).  CT guided biopsy of the superior and inferior RUL noduleson 12/30/2011 revealed well differentiated adenocarcinoma of lung primary. EGFR testing revealed no mutation. She received IMRT 6000 cGyto both nodules from 01/26/2012 - 03/10/2012.  PET scanon 01/30/2015 revealed a hypermetabolic and enlarging RUL pulmonary nodules concerning for lung cancer recurrence. There was a small focus of peripheral nodular consolidation in the superior segment of the right lower lobe with differential including inflammation/infection versus neoplasm. There was no evidence of mediastinal nodal metastasis.   In 04/2015, there was an attempt at radiofrequency ablation of 1 enlarging RUL nodule. At the time of the procedure, the nodule had shrunk to 5 mm from 9 mm.  Chest CTon 01/19/2018 revealed a subsolid 2.1 cm peripheral right lower lobe pulmonary nodule. Although not appreciably changed  in size, subjectively this nodule had increased in density, and remains suspicious for primary bronchogenic adenocarcinoma. There was no thoracic adenopathy.  CT guided RLL biopsyon 02/09/2018 revealed adenocarcinoma of lung origin, lepidic and papillary patterns.  She received6000 cGy in 5 fractions toRLL nodulefrom 03/22/2018 - 04/05/2018.  Chest CT on 03/04/2020 revealed enlarging and new pulmonary nodules suspicious for metastatic disease or metachronous primary.  PET scan on 03/19/2020 revealed that the right lower lobe pulmonary nodules did not demonstrate any hypermetabolism to suggest neoplasm. However, these were somewhat worrisome; recommendation was close CT surveillance. There was no mediastinal or hilar lymphadenopathy.  Chest CT  on 09/11/2020 revealed interval treatment of the right  lower lobe nodules. These nodules were partly obscured by adjacent parenchymal opacity, although appear decreased in size.  CT guided RLL biopsy on 04/24/2020 revealed adenocarcinoma of the lung with papillary and solid patterns.  She received SBRT to the right lung from 05/28/2020 - 06/11/2020. She received 60 Gy in 5 fractions.  She has an elevated CEAof unclear etiology. CEA was 8.1 on 01/31/2016, 9.2 on 03/03/2016, 7.9 on 10/12/2017, and 7.9 on 01/19/2019.  Colonoscopyon 06/25/2015 revealed a 4 mm polyp in the sigmoid colon. Pathology revealed a tubular adenoma without evidence of dysplasia or malignancy.  Pelvic MRIon 04/30/2016 revealed a 3.4 x 2.5 cm ovoid mass in the LEFT adnexa with signal characteristics identical to the intramural leiomyomas and consistent with an exophytic leiomyoma. 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 Endometrium was normal with a normal junctional zone. Ovaries were not identified.  She has a history of an elevated total protein.  Protein was 8.3 (6.5-8.1) on 01/19/2019.  M-spike and FLCA were normal.  She has iron deficiency anemia.  She required 2 units of PRBCs on 04/04/2020.  Colonoscopy on 06/25/2015 revealed one 4 mm polyp (tubular adenoma) in the sigmoid colon. There were non-bleeding internal hemorrhoids.  Colonoscopy on 07/26/2020 revealed diverticulosis in the entire examined colon. There were non-bleeding internal hemorrhoids. EGD on 07/26/2020 revealed erosive gastropathy with no bleeding and no stigmata of recent bleeding.   Ferritin has been followed:  10 on 04/11/2020 and 20 on 05/27/2020.  The patient received the [place] COVID-19 vaccine on 11/20/2019 and 12/11/2019.  Symptomatically, she feels "good." She is taking oral iron BID. She denies blood in the stool and black stool.  She denies any respiratory symptoms.  Exam is stable.  Plan: 1.   Labs today: CBC with diff, CMP, ferritin. 2.   Stage I RUL  adenocarcinoma Patient is s/p IMRT (completed 03/10/2012). Continue to monitor. 3.RLL adenocarcinoma Patient is s/p SBRT (completed 04/05/2018).  PET scan on 03/19/2020 revealed RLL pulmonary nodules were 10 mm and 7 mm.  Lung biopsy from 04/24/2020 confirmed adenocarcinoma of the lung with papillary and solid patterns.    It was histologically very similar to the previous RLL biopsy.    No lepidic pattern was seen.  She is s/p SBRT (completed 06/11/2020).  Follow-up [facility] on biopsy ([id number]).  Chest CT reviewed from 09/11/2020.  Agree with radiology findings.   RLL nodules appear decreased in size although possibly obscured.  Discuss the plan for ongoing surveillance 4.   Iron deficiency anemia  Hematocrit 36.2.  Hemoglobin 10.9.  MCV 75.7 on 04/29/2020.   Hematocrit 34.8.  Hemoglobin 10.2.  MCV 85.1 on 05/27/2020.   Ferritin 20.  Hematocrit 37.2.  Hemoglobin 11.4.  MCV 89.2 on 09/12/2020.   Ferritin 19.  Continue oral iron twice daily with orange juice or vitamin C.  Review interval EGD and colonoscopy on 07/26/2020.   No evidence of bleeding. 5.   RN:  Call patient with ferritin level. 6.   Chest CT on 03/11/2021. 7.   RTC after chest CT for MD assessment, labs (CBC with diff, CMP, ferritin), and review of chest CT.  I discussed the assessment and treatment plan with the patient.  The patient was provided an opportunity to ask questions and all were answered.  The patient agreed with the plan and demonstrated an understanding of the instructions.  The patient was advised to call back or seek an in person evaluation if the symptoms worsen or if the condition fails to improve as anticipated.   Nolon Stalls, MD, PhD  09/12/2020, 2:50 PM  I, Mirian Mo Tufford, am acting as Education administrator for [employer]. Mike Gip, MD, PhD.  I, [place]ss [place] C. Mike Gip, MD, have reviewed the above documentation for accuracy and completeness, and I  agree with the above.

## 2020-09-12 ENCOUNTER — Inpatient Hospital Stay: Payer: Medicare Other | Attending: Hematology and Oncology | Admitting: Hematology and Oncology

## 2020-09-12 ENCOUNTER — Other Ambulatory Visit: Payer: Self-pay

## 2020-09-12 ENCOUNTER — Ambulatory Visit: Payer: BLUE CROSS/BLUE SHIELD | Admitting: Physician Assistant

## 2020-09-12 ENCOUNTER — Inpatient Hospital Stay: Payer: Medicare Other | Attending: Hematology and Oncology

## 2020-09-12 ENCOUNTER — Encounter: Payer: Self-pay | Admitting: Hematology and Oncology

## 2020-09-12 VITALS — BP 145/79 | HR 99 | Temp 98.0°F | Resp 20 | Wt 106.7 lb

## 2020-09-12 DIAGNOSIS — E119 Type 2 diabetes mellitus without complications: Secondary | ICD-10-CM | POA: Diagnosis not present

## 2020-09-12 DIAGNOSIS — D509 Iron deficiency anemia, unspecified: Secondary | ICD-10-CM | POA: Diagnosis not present

## 2020-09-12 DIAGNOSIS — Z7982 Long term (current) use of aspirin: Secondary | ICD-10-CM | POA: Insufficient documentation

## 2020-09-12 DIAGNOSIS — Z801 Family history of malignant neoplasm of trachea, bronchus and lung: Secondary | ICD-10-CM | POA: Diagnosis not present

## 2020-09-12 DIAGNOSIS — Z8249 Family history of ischemic heart disease and other diseases of the circulatory system: Secondary | ICD-10-CM | POA: Diagnosis not present

## 2020-09-12 DIAGNOSIS — Z87891 Personal history of nicotine dependence: Secondary | ICD-10-CM | POA: Diagnosis not present

## 2020-09-12 DIAGNOSIS — Z7902 Long term (current) use of antithrombotics/antiplatelets: Secondary | ICD-10-CM | POA: Insufficient documentation

## 2020-09-12 DIAGNOSIS — C3431 Malignant neoplasm of lower lobe, right bronchus or lung: Secondary | ICD-10-CM | POA: Diagnosis present

## 2020-09-12 DIAGNOSIS — Z85118 Personal history of other malignant neoplasm of bronchus and lung: Secondary | ICD-10-CM | POA: Insufficient documentation

## 2020-09-12 DIAGNOSIS — Z923 Personal history of irradiation: Secondary | ICD-10-CM | POA: Insufficient documentation

## 2020-09-12 DIAGNOSIS — Z833 Family history of diabetes mellitus: Secondary | ICD-10-CM | POA: Diagnosis not present

## 2020-09-12 DIAGNOSIS — I10 Essential (primary) hypertension: Secondary | ICD-10-CM | POA: Insufficient documentation

## 2020-09-12 DIAGNOSIS — C3411 Malignant neoplasm of upper lobe, right bronchus or lung: Secondary | ICD-10-CM | POA: Diagnosis not present

## 2020-09-12 DIAGNOSIS — Z79899 Other long term (current) drug therapy: Secondary | ICD-10-CM | POA: Insufficient documentation

## 2020-09-12 DIAGNOSIS — Z7984 Long term (current) use of oral hypoglycemic drugs: Secondary | ICD-10-CM | POA: Diagnosis not present

## 2020-09-12 LAB — COMPREHENSIVE METABOLIC PANEL
ALT: 28 U/L (ref 0–44)
AST: 38 U/L (ref 15–41)
Albumin: 3.7 g/dL (ref 3.5–5.0)
Alkaline Phosphatase: 75 U/L (ref 38–126)
Anion gap: 9 (ref 5–15)
BUN: 16 mg/dL (ref 8–23)
CO2: 30 mmol/L (ref 22–32)
Calcium: 8.8 mg/dL — ABNORMAL LOW (ref 8.9–10.3)
Chloride: 98 mmol/L (ref 98–111)
Creatinine, Ser: 0.71 mg/dL (ref 0.44–1.00)
GFR, Estimated: >60 mL/min (ref >60)
Glucose, Bld: 148 mg/dL — ABNORMAL HIGH (ref 70–99)
Potassium: 3.5 mmol/L (ref 3.5–5.1)
Sodium: 137 mmol/L (ref 135–145)
Total Bilirubin: 0.5 mg/dL (ref 0.3–1.2)
Total Protein: 7.4 g/dL (ref 6.5–8.1)

## 2020-09-12 LAB — CBC WITH DIFFERENTIAL/PLATELET
Abs Immature Granulocytes: 0.01 10*3/uL (ref 0.00–0.07)
Basophils Absolute: 0 10*3/uL (ref 0.0–0.1)
Basophils Relative: 1 %
Eosinophils Absolute: 0.1 10*3/uL (ref 0.0–0.5)
Eosinophils Relative: 2 %
HCT: 37.2 % (ref 36.0–46.0)
Hemoglobin: 11.4 g/dL — ABNORMAL LOW (ref 12.0–15.0)
Immature Granulocytes: 0 %
Lymphocytes Relative: 16 %
Lymphs Abs: 0.6 10*3/uL — ABNORMAL LOW (ref 0.7–4.0)
MCH: 27.3 pg (ref 26.0–34.0)
MCHC: 30.6 g/dL (ref 30.0–36.0)
MCV: 89.2 fL (ref 80.0–100.0)
Monocytes Absolute: 0.5 10*3/uL (ref 0.1–1.0)
Monocytes Relative: 13 %
Neutro Abs: 2.8 10*3/uL (ref 1.7–7.7)
Neutrophils Relative %: 68 %
Platelets: 249 10*3/uL (ref 150–400)
RBC: 4.17 MIL/uL (ref 3.87–5.11)
RDW: 15.9 % — ABNORMAL HIGH (ref 11.5–15.5)
WBC: 4 10*3/uL (ref 4.0–10.5)
nRBC: 0 % (ref 0.0–0.2)

## 2020-09-12 LAB — FERRITIN: Ferritin: 19 ng/mL (ref 11–307)

## 2020-09-13 ENCOUNTER — Telehealth: Payer: Self-pay

## 2020-09-13 NOTE — Telephone Encounter (Signed)
I spoke with patient and informed her per dr.corcoran she would her to continue her oral iron.

## 2020-09-20 ENCOUNTER — Encounter: Payer: Self-pay | Admitting: Physician Assistant

## 2020-09-20 ENCOUNTER — Other Ambulatory Visit: Payer: Self-pay

## 2020-09-20 ENCOUNTER — Ambulatory Visit (INDEPENDENT_AMBULATORY_CARE_PROVIDER_SITE_OTHER): Payer: Medicare Other | Admitting: Internal Medicine

## 2020-09-20 VITALS — BP 142/77 | HR 83 | Temp 97.5°F | Resp 16 | Ht 59.0 in | Wt 105.2 lb

## 2020-09-20 DIAGNOSIS — E041 Nontoxic single thyroid nodule: Secondary | ICD-10-CM | POA: Diagnosis not present

## 2020-09-20 DIAGNOSIS — I700 Atherosclerosis of aorta: Secondary | ICD-10-CM

## 2020-09-20 DIAGNOSIS — I10 Essential (primary) hypertension: Secondary | ICD-10-CM

## 2020-09-20 DIAGNOSIS — J449 Chronic obstructive pulmonary disease, unspecified: Secondary | ICD-10-CM | POA: Diagnosis not present

## 2020-09-20 DIAGNOSIS — E1165 Type 2 diabetes mellitus with hyperglycemia: Secondary | ICD-10-CM | POA: Diagnosis not present

## 2020-09-20 MED ORDER — POTASSIUM CHLORIDE ER 10 MEQ PO TBCR: EXTENDED_RELEASE_TABLET | ORAL | 1 refills | Status: DC

## 2020-09-20 MED ORDER — LISINOPRIL-HYDROCHLOROTHIAZIDE 10-12.5 MG PO TABS: 1.0000 | ORAL_TABLET | Freq: Every day | ORAL | 3 refills | Status: DC

## 2020-09-20 NOTE — Progress Notes (Signed)
[facility] [place], [place] [address]  Internal MEDICINE  Office Visit Note  Patient Name: Angela Jensen  [id number]  [id number]  Date of Service: 09/26/2020  Chief Complaint  Patient presents with  . Follow-up  . Depression  . Diabetes  . Hyperlipidemia  . Hypertension    HPI Pt is here for routine follow up, overall feels well. Her norvasc was stopped due to low bp, she continues to be Lisinopril/hctz and potassium supplement. U/s thyroid did show a nodule about 0.8 cm. Pt aslo has h/o anemia and was transfused 2 units, followed by GI and haematology    Followed by oncology for lung cancer, recent CT chest 1/22 has the following findings 1. Interval treatment of the right lower lobe nodules seen previously. These nodules are partly obscured by adjacent parenchymal opacity, although appear decreased in size. Continued follow-up recommended. 2. Stable chronic sequela of radiation therapy in the right upper lobe and along the minor fissure. 3. No new or enlarging nodules. No adenopathy or pleural effusion. 4. Stable right 7th rib fracture without pathologic components or interval healing. 5. Aortic Atherosclerosis (ICD10-I70.0) and Emphysema (ICD10-J43.9). Current Medication: Outpatient Encounter Medications as of 09/20/2020  Medication Sig  . potassium chloride (KLOR-CON) 10 MEQ tablet Take one tab po qod  . [DISCONTINUED] lisinopril-hydrochlorothiazide (ZESTORETIC) 10-12.5 MG tablet Take 1 tablet by mouth daily.  . Accu-Chek FastClix Lancets MISC Use as directed twice a day diag E11.65  . albuterol (PROVENTIL) (2.5 MG/3ML) 0.083% nebulizer solution Take 3 mLs (2.5 mg total) by nebulization every 6 (six) hours as needed for wheezing.  Marland Kitchen aspirin EC 81 MG tablet Take 1 tablet (81 mg total) by mouth daily.  Marland Kitchen buPROPion (WELLBUTRIN XL) 150 MG 24 hr tablet TAKE 1 TABLET BY MOUTH TWICE DAILY FOR SMOKING CESSATION  . clopidogrel (PLAVIX) 75 MG tablet Take 1  tablet by mouth once daily  . ferrous sulfate 324 MG TBEC Take 324 mg by mouth.  . gabapentin (NEURONTIN) 100 MG capsule Take 1 capsule (100 mg total) by mouth 2 (two) times daily.  Marland Kitchen glucose blood (ACCU-CHEK AVIVA PLUS) test strip Use as instructed to check blood sugars three times daily.  E11.65  . lisinopril-hydrochlorothiazide (ZESTORETIC) 10-12.5 MG tablet Take 1 tablet by mouth daily.  . meloxicam (MOBIC) 7.5 MG tablet Take 1 tablet (7.5 mg total) by mouth 2 (two) times daily.  . mometasone-formoterol (DULERA) 200-5 MCG/ACT AERO Inhale 1 puff into the lungs 2 (two) times daily. Please fill as 90 day prescription  . polyethylene glycol-electrolytes (NULYTELY) 420 g solution Drink one 8 oz glass every 20 mins until entire container is finished starting at 5:00pm on 07/25/20  . rosuvastatin (CRESTOR) 10 MG tablet Take 1 tablet (10 mg total) by mouth at bedtime.  Marland Kitchen SPIRIVA HANDIHALER 18 MCG inhalation capsule INHALE THE CONTENTS OF ONE CAPSULE BY MOUTH ONCE DAILY. DO NOT SWALLOW CAPSULE.  . VENTOLIN HFA 108 (90 Base) MCG/ACT inhaler INHALE 2 PUFFS INTO LUNGS EVERY 4 HOURS AS NEEDED FOR WHEEZING OR SHORTNESS OF BREATH  . [DISCONTINUED] amLODipine (NORVASC) 10 MG tablet Take 1 tablet (10 mg total) by mouth every morning. (Patient not taking: No sig reported)  . [DISCONTINUED] cyclobenzaprine (FLEXERIL) 5 MG tablet Take 1 tablet (5 mg total) by mouth 3 (three) times daily as needed.  . [DISCONTINUED] JANUVIA 25 MG tablet Take 1 tablet by mouth once daily  . [DISCONTINUED] lisinopril-hydrochlorothiazide (ZESTORETIC) 10-12.5 MG tablet Take 1 tablet by mouth every morning.  . [  DISCONTINUED] metFORMIN (GLUCOPHAGE-XR) 500 MG 24 hr tablet Take 1 tablet (500 mg total) by mouth daily with breakfast.  . [DISCONTINUED] potassium chloride (KLOR-CON) 10 MEQ tablet TAKE 1 TABLET BY MOUTH EVERY OTHER DAY FOR  LOW  POTASSIUM (Patient taking differently: Take 10 mEq by mouth every other day.)  . [DISCONTINUED]  rOPINIRole (REQUIP) 0.5 MG tablet Take 1 tablet (0.5 mg total) by mouth 2 (two) times daily.   No facility-administered encounter medications on file as of 09/20/2020.    Surgical History: Past Surgical History:  Procedure Laterality Date  . CESAREAN SECTION     x3  . COLONOSCOPY WITH PROPOFOL N/A 06/25/2015   Procedure: COLONOSCOPY WITH PROPOFOL;  Surgeon: Lucilla Lame, MD;  Location: [facility];  Service: Endoscopy;  Laterality: N/A;  . COLONOSCOPY WITH PROPOFOL N/A 07/26/2020   Procedure: COLONOSCOPY WITH PROPOFOL;  Surgeon: Lucilla Lame, MD;  Location: [place];  Service: Endoscopy;  Laterality: N/A;  . CYST REMOVAL LEG     and on shoulder   . ESOPHAGOGASTRODUODENOSCOPY (EGD) WITH PROPOFOL N/A 07/26/2020   Procedure: ESOPHAGOGASTRODUODENOSCOPY (EGD) WITH PROPOFOL;  Surgeon: Lucilla Lame, MD;  Location: [place];  Service: Endoscopy;  Laterality: N/A;  Diabetic - oral meds  . LOWER EXTREMITY ANGIOGRAPHY Left 09/29/2018   Procedure: LOWER EXTREMITY ANGIOGRAPHY;  Surgeon: Algernon Huxley, MD;  Location: [place] [facility];  Service: Cardiovascular;  Laterality: Left;  . LUNG BIOPSY  05 15 2013   has lung "spots"  . PERIPHERAL VASCULAR CATHETERIZATION Left 06/01/2016   Procedure: Lower Extremity Angiography;  Surgeon: Algernon Huxley, MD;  Location: [place] [facility];  Service: Cardiovascular;  Laterality: Left;  . PERIPHERAL VASCULAR CATHETERIZATION N/A 06/01/2016   Procedure: Abdominal Aortogram w/Lower Extremity;  Surgeon: Algernon Huxley, MD;  Location: [place] [facility];  Service: Cardiovascular;  Laterality: N/A;  . PERIPHERAL VASCULAR CATHETERIZATION  06/01/2016   Procedure: Lower Extremity Intervention;  Surgeon: Algernon Huxley, MD;  Location: [place] [facility];  Service: Cardiovascular;;  . PERIPHERAL VASCULAR CATHETERIZATION Right 06/08/2016   Procedure: Lower Extremity Angiography;  Surgeon: Algernon Huxley, MD;  Location: [place] [facility];  Service:  Cardiovascular;  Laterality: Right;  . PERIPHERAL VASCULAR CATHETERIZATION  06/08/2016   Procedure: Lower Extremity Intervention;  Surgeon: Algernon Huxley, MD;  Location: [place] [facility];  Service: Cardiovascular;;    Medical History: Past Medical History:  Diagnosis Date  . Anemia   . Arthritis   . Asthma   . Atherosclerosis of native arteries of extremity with intermittent claudication ([place]) 05/26/2016  . Cancer [facility]) 2012   Right Lung CA  . COPD (chronic obstructive pulmonary disease) ([place])   . Depression   . Diabetes mellitus without complication [facility])    Patient takes Janumet  . Essential hypertension 05/26/2016  . Hypercholesteremia   . 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 PAD (peripheral artery disease) ([place]) 06/22/2016  . Peripheral vascular disease ([place])   . Personal history of radiation therapy   . Shortness of breath dyspnea    with exertion   . Sleep apnea   . Wears dentures    full upper and lower    Family History: Family History  Problem Relation Age of Onset  . Lung cancer Father   . Diabetes Mother   . Hypercholesterolemia Mother   . Diabetes Sister   . Diabetes Maternal Grandmother   . Diabetes Paternal Grandmother   . Diabetes Sister   .  Heart attack Brother   . Coronary artery disease Brother   . Vascular Disease Brother   . Hypertension Sister   . Heart attack Brother     Social History   Socioeconomic History  . Marital status: Widowed    Spouse name: Not on file  . Number of children: Not on file  . Years of education: Not on file  . Highest education level: Not on file  Occupational History  . Not on file  Tobacco Use  . Smoking status: Former Smoker    Packs/day: 1.00    Years: 37.00    Pack years: 37.00    Types: Cigarettes    Quit date: 02/06/2010    Years since quitting: 10.6  . Smokeless tobacco: Former Systems developer    Types: Snuff  Vaping Use  . Vaping Use: Never used  Substance and Sexual Activity  . Alcohol use: Not  Currently    Alcohol/week: 5.0 standard drinks    Types: 5 Cans of beer per week    Comment: /h x of alcohol abuse -stopped 2012- now drinks 5 beer per w  . Drug use: Not Currently    Types: Marijuana, "Crack" cocaine, Cocaine    Comment: hx of cocaine use- last use 2015; last use sat marijuana6/22/19,   . Sexual activity: Yes  Other Topics Concern  . Not on file  Social History Narrative   Lives with Significant Other x 43 years   Social Determinants of Health   Financial Resource Strain: Not on file  Food Insecurity: Not on file  Transportation Needs: Not on file  Physical Activity: Not on file  Stress: Not on file  Social Connections: Not on file  Intimate Partner Violence: Not on file      Review of Systems  Constitutional: Negative for chills, diaphoresis and fatigue.  HENT: Negative for ear pain, postnasal drip and sinus pressure.   Eyes: Negative for photophobia, discharge, redness, itching and visual disturbance.  Respiratory: Negative for cough, shortness of breath and wheezing.   Cardiovascular: Negative for chest pain, palpitations and leg swelling.  Gastrointestinal: Negative for abdominal pain, constipation, diarrhea, nausea and vomiting.  Genitourinary: Negative for dysuria and flank pain.  Musculoskeletal: Negative for arthralgias, back pain, gait problem and neck pain.  Skin: Negative for color change.  Allergic/Immunologic: Negative for environmental allergies and food allergies.  Neurological: Negative for dizziness and headaches.  Hematological: Does not bruise/bleed easily.  Psychiatric/Behavioral: Negative for agitation, behavioral problems (depression) and hallucinations.    Vital Signs: BP (!) 142/77   Pulse 83   Temp (!) 97.5 F (36.4 C)   Resp 16   Ht 4\' 11"  (1.499 m)   Wt 105 lb 3.2 oz (47.7 kg)   SpO2 100%   BMI 21.25 kg/m    Physical Exam Constitutional:      General: She is not in acute distress.    Appearance: She is  well-developed. She is not diaphoretic.  HENT:     Head: Normocephalic and atraumatic.     Mouth/Throat:     Pharynx: No oropharyngeal exudate.  Eyes:     Pupils: Pupils are equal, round, and reactive to light.  Neck:     Thyroid: No thyromegaly.     Vascular: No JVD.     Trachea: No tracheal deviation.  Cardiovascular:     Rate and Rhythm: Normal rate and regular rhythm.     Heart sounds: Normal heart sounds. No murmur heard. No friction rub. No gallop.  Pulmonary:     Effort: Pulmonary effort is normal. No respiratory distress.     Breath sounds: No wheezing or rales.  Chest:     Chest wall: No tenderness.  Abdominal:     General: Bowel sounds are normal.     Palpations: Abdomen is soft.  Musculoskeletal:        General: Normal range of motion.     Cervical back: Normal range of motion and neck supple.  Lymphadenopathy:     Cervical: No cervical adenopathy.  Skin:    General: Skin is warm and dry.  Neurological:     Mental Status: She is alert and oriented to person, place, and time.     Cranial Nerves: No cranial nerve deficit.  Psychiatric:        Behavior: Behavior normal.        Thought Content: Thought content normal.        Judgment: Judgment normal.     Assessment/Plan: 1. Thyroid nodule incidentally noted on imaging study With her h/o lung cancer, it will be a good idea to be evaluated for possible biopsy. - Ambulatory referral to ENT  2. Benign hypertension Continue on lisinopril/hctz, does have h/o hypokalemia, is on potassium replacement, will continue to monitor   - Basic Metabolic Panel (BMET)  3. Obstructive chronic bronchitis without exacerbation ([place]) Controlled with MDI Pt was instructed to be careful to exposures to smoking, to sick people and to extreme weathers. To notify the office as soon as the patient starts to have any symptoms of cough , fatigue and fever. Proper use of MDI is stressed along with compliance.   4. Uncontrolled type 2  diabetes mellitus with hyperglycemia (HCC) Continue metformin as before. Last hg a1c 5.4  5. Aortic atherosclerosis (HCC) Continue Crestor as before   General Counseling: Shasha verbalizes understanding of the findings of todays visit and agrees with plan of treatment. I have discussed any further diagnostic evaluation that may be needed or ordered today. We also reviewed her medications today. she has been encouraged to call the office with any questions or concerns that should arise related to todays visit.    Orders Placed This Encounter  Procedures  . Basic Metabolic Panel (BMET)  . Ambulatory referral to ENT    Meds ordered this encounter  Medications  . DISCONTD: lisinopril-hydrochlorothiazide (ZESTORETIC) 10-12.5 MG tablet    Sig: Take 1 tablet by mouth daily.    Dispense:  90 tablet    Refill:  3  . potassium chloride (KLOR-CON) 10 MEQ tablet    Sig: Take one tab po qod    Dispense:  45 tablet    Refill:  1  . lisinopril-hydrochlorothiazide (ZESTORETIC) 10-12.5 MG tablet    Sig: Take 1 tablet by mouth daily.    Dispense:  90 tablet    Refill:  3    Total time spent  30   Minutes Time spent includes review of chart, medications, test results, and follow up plan with the patient.   [place] Controlled Substance Database was reviewed by me.   Dr [name] Internal medicine

## 2020-10-01 ENCOUNTER — Other Ambulatory Visit: Payer: Self-pay | Admitting: Unknown Physician Specialty

## 2020-10-01 DIAGNOSIS — D490 Neoplasm of unspecified behavior of digestive system: Secondary | ICD-10-CM

## 2020-10-01 DIAGNOSIS — D37032 Neoplasm of uncertain behavior of the submandibular salivary glands: Secondary | ICD-10-CM

## 2020-10-01 DIAGNOSIS — E041 Nontoxic single thyroid nodule: Secondary | ICD-10-CM

## 2020-10-08 ENCOUNTER — Ambulatory Visit
Admission: RE | Admit: 2020-10-08 | Discharge: 2020-10-08 | Disposition: A | Discharge status: Home / Self Care | Payer: Medicare Other | Source: Ambulatory Visit | Attending: Unknown Physician Specialty | Admitting: Unknown Physician Specialty

## 2020-10-08 ENCOUNTER — Other Ambulatory Visit: Payer: Self-pay

## 2020-10-08 DIAGNOSIS — D490 Neoplasm of unspecified behavior of digestive system: Secondary | ICD-10-CM | POA: Diagnosis present

## 2020-10-08 DIAGNOSIS — E041 Nontoxic single thyroid nodule: Secondary | ICD-10-CM | POA: Diagnosis present

## 2020-10-10 ENCOUNTER — Other Ambulatory Visit: Payer: Self-pay | Admitting: Unknown Physician Specialty

## 2020-10-10 DIAGNOSIS — D490 Neoplasm of unspecified behavior of digestive system: Secondary | ICD-10-CM

## 2020-10-18 ENCOUNTER — Ambulatory Visit: Payer: BLUE CROSS/BLUE SHIELD

## 2020-10-23 ENCOUNTER — Ambulatory Visit: Payer: Medicare Other | Admitting: Radiation Oncology

## 2020-10-25 ENCOUNTER — Ambulatory Visit
Admission: RE | Admit: 2020-10-25 | Discharge: 2020-10-25 | Disposition: A | Discharge status: Home / Self Care | Payer: Medicare Other | Source: Ambulatory Visit | Attending: Unknown Physician Specialty | Admitting: Unknown Physician Specialty

## 2020-10-25 ENCOUNTER — Other Ambulatory Visit: Payer: Self-pay

## 2020-10-25 DIAGNOSIS — D490 Neoplasm of unspecified behavior of digestive system: Secondary | ICD-10-CM

## 2020-10-25 LAB — POCT I-STAT CREATININE: Creatinine, Ser: 0.7 mg/dL (ref 0.44–1.00)

## 2020-10-25 MED ORDER — IOHEXOL 300 MG/ML  SOLN
75.0000 mL | Freq: Once | INTRAMUSCULAR | Status: AC | PRN
  Administered 2020-10-25: 75 mL via INTRAVENOUS

## 2020-10-30 ENCOUNTER — Encounter: Payer: Self-pay | Admitting: Radiation Oncology

## 2020-10-30 ENCOUNTER — Ambulatory Visit
Admission: RE | Admit: 2020-10-30 | Discharge: 2020-10-30 | Disposition: A | Discharge status: Home / Self Care | Payer: Medicare Other | Source: Ambulatory Visit | Attending: Radiation Oncology | Admitting: Radiation Oncology

## 2020-10-30 VITALS — BP 102/57 | HR 89 | Temp 96.4°F | Wt 109.5 lb

## 2020-10-30 DIAGNOSIS — C3481 Malignant neoplasm of overlapping sites of right bronchus and lung: Secondary | ICD-10-CM

## 2020-10-30 NOTE — Progress Notes (Signed)
Radiation Oncology Follow up Note  Name: Angela Jensen   Date:   10/30/2020 MRN:  [id number] DOB: [birth year]    This 65 y.o. 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  REFERRING PROVIDER: Ronnell Freshwater, NP  HPI: Patient is a 65 year old female status post multiple SBRT's for adenocarcinoma of the lung.  She is seen today in routine follow-up she is doing well.  She has a mild nonproductive cough no hemoptysis or chest tightness..  She had a CT scan of the chest back in January showing interval treatment of the right lower lobe nodules partly excluded by adjacent parenchymal opacity although they appear decreased in size.  She also has stable chronic sequela of radiation to her right upper lobe no new or enlarging nodes or adenopathy or pleural effusion is noted.  She recently had a CT scan of the soft tissue of the head and neck by ENT showing a left submandibular gland with a large calculus corresponding to findings of prior ultrasound no evidence of malignancy was noted.  COMPLICATIONS OF TREATMENT: none  FOLLOW UP COMPLIANCE: keeps appointments   PHYSICAL EXAM:  BP (!) 102/57    Pulse 89    Temp (!) 96.4 F (35.8 C) (Tympanic)    Wt 109 lb 8 oz (49.7 kg)    BMI 22.12 kg/m  Well-developed well-nourished patient in NAD. HEENT reveals PERLA, EOMI, discs not visualized.  Oral cavity is clear. No oral mucosal lesions are identified. Neck is clear without evidence of cervical or supraclavicular adenopathy. Lungs are clear to A&P. Cardiac examination is essentially unremarkable with regular rate and rhythm without murmur rub or thrill. Abdomen is benign with no organomegaly or masses noted. Motor sensory and DTR levels are equal and symmetric in the upper and lower extremities. Cranial nerves II through XII are grossly intact. Proprioception is  intact. No peripheral adenopathy or edema is identified. No motor or sensory levels are noted. Crude visual fields are within normal range.  RADIOLOGY RESULTS: CT scan of chest as well as CT scan of soft tissue head and neck reviewed compatible with above-stated findings  PLAN: Present time patient is doing well with no evidence of disease.  She continues follow-up with ENT for her left submandibular large calculus.  I am otherwise pleased with her overall progress.  Of asked to see her back in 6 months for follow-up anticipate another CT scan prior to that visit.  Patient knows to call with any concerns.  I would like to take this opportunity to thank you for allowing me to participate in the care of your patient.Angela Filbert, MD

## 2020-11-04 ENCOUNTER — Encounter: Payer: Self-pay | Admitting: Physician Assistant

## 2020-11-04 ENCOUNTER — Ambulatory Visit: Payer: Medicare Other | Admitting: Physician Assistant

## 2020-11-04 ENCOUNTER — Ambulatory Visit (INDEPENDENT_AMBULATORY_CARE_PROVIDER_SITE_OTHER): Payer: Medicare Other | Admitting: Physician Assistant

## 2020-11-04 ENCOUNTER — Other Ambulatory Visit: Payer: Self-pay

## 2020-11-04 DIAGNOSIS — I739 Peripheral vascular disease, unspecified: Secondary | ICD-10-CM

## 2020-11-04 DIAGNOSIS — E1165 Type 2 diabetes mellitus with hyperglycemia: Secondary | ICD-10-CM

## 2020-11-04 DIAGNOSIS — Z01818 Encounter for other preprocedural examination: Secondary | ICD-10-CM

## 2020-11-04 DIAGNOSIS — I700 Atherosclerosis of aorta: Secondary | ICD-10-CM

## 2020-11-04 DIAGNOSIS — I10 Essential (primary) hypertension: Secondary | ICD-10-CM

## 2020-11-04 DIAGNOSIS — J449 Chronic obstructive pulmonary disease, unspecified: Secondary | ICD-10-CM | POA: Diagnosis not present

## 2020-11-04 LAB — POCT GLYCOSYLATED HEMOGLOBIN (HGB A1C): Hemoglobin A1C: 6 % — AB (ref 4.0–5.6)

## 2020-11-04 MED ORDER — GABAPENTIN 100 MG PO CAPS: 100.0000 mg | ORAL_CAPSULE | Freq: Two times a day (BID) | ORAL | 3 refills | Status: DC

## 2020-11-04 MED ORDER — ALBUTEROL SULFATE HFA 108 (90 BASE) MCG/ACT IN AERS: 2.0000 | INHALATION_SPRAY | Freq: Four times a day (QID) | RESPIRATORY_TRACT | 0 refills | Status: DC | PRN

## 2020-11-04 MED ORDER — SPIRIVA HANDIHALER 18 MCG IN CAPS: ORAL_CAPSULE | RESPIRATORY_TRACT | 0 refills | Status: DC

## 2020-11-04 MED ORDER — FLUTICASONE-SALMETEROL 250-50 MCG/DOSE IN AEPB: 1.0000 | INHALATION_SPRAY | Freq: Two times a day (BID) | RESPIRATORY_TRACT | 3 refills | Status: DC

## 2020-11-04 NOTE — Progress Notes (Signed)
[facility] [place], [place] [address]  Internal MEDICINE  Office Visit Note  Patient Name: Angela Jensen  [id number]  [id number]  Date of Service: 11/05/2020  Chief Complaint  Patient presents with  . Follow-up    Toes numb, wants stents replaced in legs, right shoulder hurts  . Depression  . Diabetes  . Hyperlipidemia  . Hypertension  . Sleep Apnea  . COPD  . Asthma  . Anemia  . Quality Metric Gaps    dexa    HPI Pt is here for f/u. -She reports her toes are numb, last year [employer] did not cover [facility] and could not follow up with vascular who had plans to put stents in her legs. Was told circ was bad. Needs referral back to vascular surgery now that insurance has changed. -R shoulder hurts since stopping the mobic last visit. -Stopped requip and gabapentin due to poly[harmacy last visit. She has lots of neuropathy and wants to go back on gabapentin. -inhalers are not covered anymore. Needs alternative albuterol inhaler. Spiriva and dulera not covered--will look into alternatives. Pulm visit in July. -She stopped crestor bc of cramps--refuses any statin -BG at home 160 after eating. Was still taking remaining januvia for awhile but then stopped. Was told to stop this last visit and has not been taking metformin either. -concern about plavix/ASA before surgery for salivary stones and surgeon asking for he rto come off this briefly for procedure.  Current Medication: Outpatient Encounter Medications as of 11/04/2020  Medication Sig  . Accu-Chek FastClix Lancets MISC Use as directed twice a day diag E11.65  . albuterol (PROVENTIL) (2.5 MG/3ML) 0.083% nebulizer solution Take 3 mLs (2.5 mg total) by nebulization every 6 (six) hours as needed for wheezing.  Marland Kitchen albuterol (VENTOLIN HFA) 108 (90 Base) MCG/ACT inhaler Inhale 2 puffs into the lungs every 6 (six) hours as needed for wheezing or shortness of breath.  Marland Kitchen aspirin EC 81 MG tablet Take 1 tablet  (81 mg total) by mouth daily.  . clopidogrel (PLAVIX) 75 MG tablet Take 1 tablet by mouth once daily  . ferrous sulfate 324 MG TBEC Take 324 mg by mouth.  . Fluticasone-Salmeterol (ADVAIR DISKUS) 250-50 MCG/DOSE AEPB Inhale 1 puff into the lungs 2 (two) times daily. Rinse mouth out after.  . gabapentin (NEURONTIN) 100 MG capsule Take 1 capsule (100 mg total) by mouth 2 (two) times daily.  Marland Kitchen glucose blood (ACCU-CHEK AVIVA PLUS) test strip Use as instructed to check blood sugars three times daily.  E11.65  . lisinopril-hydrochlorothiazide (ZESTORETIC) 10-12.5 MG tablet Take 1 tablet by mouth daily.  . potassium chloride (KLOR-CON) 10 MEQ tablet Take one tab po qod  . [DISCONTINUED] mometasone-formoterol (DULERA) 200-5 MCG/ACT AERO Inhale 1 puff into the lungs 2 (two) times daily. Please fill as 90 day prescription  . [DISCONTINUED] SPIRIVA HANDIHALER 18 MCG inhalation capsule INHALE THE CONTENTS OF ONE CAPSULE BY MOUTH ONCE DAILY. DO NOT SWALLOW CAPSULE.  . [DISCONTINUED] VENTOLIN HFA 108 (90 Base) MCG/ACT inhaler INHALE 2 PUFFS INTO LUNGS EVERY 4 HOURS AS NEEDED FOR WHEEZING OR SHORTNESS OF BREATH  . tiotropium (SPIRIVA HANDIHALER) 18 MCG inhalation capsule INHALE THE CONTENTS OF ONE CAPSULE BY MOUTH ONCE DAILY. DO NOT SWALLOW CAPSULE.  . [DISCONTINUED] polyethylene glycol-electrolytes (NULYTELY) 420 g solution Drink one 8 oz glass every 20 mins until entire container is finished starting at 5:00pm on 07/25/20 (Patient not taking: Reported on 10/30/2020)   No facility-administered encounter medications on file  as of 11/04/2020.    Surgical History: Past Surgical History:  Procedure Laterality Date  . CESAREAN SECTION     x3  . COLONOSCOPY WITH PROPOFOL N/A 06/25/2015   Procedure: COLONOSCOPY WITH PROPOFOL;  Surgeon: Lucilla Lame, MD;  Location: [facility];  Service: Endoscopy;  Laterality: N/A;  . COLONOSCOPY WITH PROPOFOL N/A 07/26/2020   Procedure: COLONOSCOPY WITH PROPOFOL;  Surgeon: Lucilla Lame, MD;  Location: [place];  Service: Endoscopy;  Laterality: N/A;  . CYST REMOVAL LEG     and on shoulder   . ESOPHAGOGASTRODUODENOSCOPY (EGD) WITH PROPOFOL N/A 07/26/2020   Procedure: ESOPHAGOGASTRODUODENOSCOPY (EGD) WITH PROPOFOL;  Surgeon: Lucilla Lame, MD;  Location: [place];  Service: Endoscopy;  Laterality: N/A;  Diabetic - oral meds  . LOWER EXTREMITY ANGIOGRAPHY Left 09/29/2018   Procedure: LOWER EXTREMITY ANGIOGRAPHY;  Surgeon: Algernon Huxley, MD;  Location: [place] [facility];  Service: Cardiovascular;  Laterality: Left;  . LUNG BIOPSY  05 15 2013   has lung "spots"  . PERIPHERAL VASCULAR CATHETERIZATION Left 06/01/2016   Procedure: Lower Extremity Angiography;  Surgeon: Algernon Huxley, MD;  Location: [place] [facility];  Service: Cardiovascular;  Laterality: Left;  . PERIPHERAL VASCULAR CATHETERIZATION N/A 06/01/2016   Procedure: Abdominal Aortogram w/Lower Extremity;  Surgeon: Algernon Huxley, MD;  Location: [place] [facility];  Service: Cardiovascular;  Laterality: N/A;  . PERIPHERAL VASCULAR CATHETERIZATION  06/01/2016   Procedure: Lower Extremity Intervention;  Surgeon: Algernon Huxley, MD;  Location: [place] [facility];  Service: Cardiovascular;;  . PERIPHERAL VASCULAR CATHETERIZATION Right 06/08/2016   Procedure: Lower Extremity Angiography;  Surgeon: Algernon Huxley, MD;  Location: [place] [facility];  Service: Cardiovascular;  Laterality: Right;  . PERIPHERAL VASCULAR CATHETERIZATION  06/08/2016   Procedure: Lower Extremity Intervention;  Surgeon: Algernon Huxley, MD;  Location: [place] [facility];  Service: Cardiovascular;;    Medical History: Past Medical History:  Diagnosis Date  . Anemia   . Arthritis   . Asthma   . Atherosclerosis of native arteries of extremity with intermittent claudication ([place]) 05/26/2016  . Cancer [facility]) 2012   Right Lung CA  . COPD (chronic obstructive pulmonary disease) ([place])   . Depression   . Diabetes mellitus without  complication [facility])    Patient takes Janumet  . Essential hypertension 05/26/2016  . Hypercholesteremia   . 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 PAD (peripheral artery disease) ([place]) 06/22/2016  . Peripheral vascular disease ([place])   . Personal history of radiation therapy   . Shortness of breath dyspnea    with exertion   . Sialolithiasis   . Sleep apnea   . Wears dentures    full upper and lower    Family History: Family History  Problem Relation Age of Onset  . Lung cancer Father   . Diabetes Mother   . Hypercholesterolemia Mother   . Diabetes Sister   . Diabetes Maternal Grandmother   . Diabetes Paternal Grandmother   . Diabetes Sister   . Heart attack Brother   . Coronary artery disease Brother   . Vascular Disease Brother   . Hypertension Sister   . Heart attack Brother     Social History   Socioeconomic History  . Marital status: Widowed    Spouse name: Not on file  . Number of children: Not on file  . Years of education: Not on file  . Highest education level: Not on file  Occupational History  . Not on file  Tobacco Use  . Smoking status: Former Smoker    Packs/day: 1.00    Years: 37.00    Pack years: 37.00    Types: Cigarettes    Quit date: 02/06/2010    Years since quitting: 10.7  . Smokeless tobacco: Former Systems developer    Types: Snuff  Vaping Use  . Vaping Use: Never used  Substance and Sexual Activity  . Alcohol use: Not Currently    Alcohol/week: 5.0 standard drinks    Types: 5 Cans of beer per week    Comment: /h x of alcohol abuse -stopped 2012- now drinks 5 beer per w  . Drug use: Not Currently    Types: Marijuana, "Crack" cocaine, Cocaine    Comment: hx of cocaine use- last use 2015; last use sat marijuana6/22/19,   . Sexual activity: Yes  Other Topics Concern  . Not on file  Social History Narrative   Lives with Significant Other x 43 years   Social Determinants of Health   Financial Resource Strain: Not on file  Food Insecurity: Not on  file  Transportation Needs: Not on file  Physical Activity: Not on file  Stress: Not on file  Social Connections: Not on file  Intimate Partner Violence: Not on file      Review of Systems  Constitutional: Negative for chills, fatigue and unexpected weight change.  HENT: Negative for congestion, postnasal drip, rhinorrhea, sneezing and sore throat.   Eyes: Negative for redness.  Respiratory: Positive for shortness of breath. Negative for cough and chest tightness.   Cardiovascular: Negative for chest pain and palpitations.  Gastrointestinal: Negative for abdominal pain, constipation, diarrhea, nausea and vomiting.  Genitourinary: Negative for dysuria and frequency.  Musculoskeletal: Positive for arthralgias and myalgias. Negative for back pain, joint swelling and neck pain.  Skin: Negative for rash.  Neurological: Positive for numbness. Negative for tremors.  Hematological: Negative for adenopathy. Does not bruise/bleed easily.  Psychiatric/Behavioral: Negative for behavioral problems (Depression), sleep disturbance and suicidal ideas. The patient is not nervous/anxious.     Vital Signs: BP 122/80   Pulse (!) 108   Temp (!) 96.9 F (36.1 C)   Resp 16   Ht 4\' 11"  (1.499 m)   Wt 107 lb 12.8 oz (48.9 kg)   SpO2 98%   BMI 21.77 kg/m    Physical Exam Constitutional:      General: She is not in acute distress.    Appearance: She is well-developed and normal weight. She is not diaphoretic.  HENT:     Head: Normocephalic and atraumatic.     Mouth/Throat:     Pharynx: No oropharyngeal exudate.  Eyes:     Pupils: Pupils are equal, round, and reactive to light.  Neck:     Thyroid: No thyromegaly.     Vascular: No JVD.     Trachea: No tracheal deviation.  Cardiovascular:     Rate and Rhythm: Normal rate and regular rhythm.     Heart sounds: Normal heart sounds. No murmur heard. No friction rub. No gallop.   Pulmonary:     Effort: Pulmonary effort is normal. No respiratory  distress.     Breath sounds: No wheezing or rales.  Chest:     Chest wall: No tenderness.  Abdominal:     General: Bowel sounds are normal.     Palpations: Abdomen is soft.  Musculoskeletal:        General: Normal range of motion.  Cervical back: Normal range of motion and neck supple.  Lymphadenopathy:     Cervical: No cervical adenopathy.  Skin:    General: Skin is warm and dry.  Neurological:     Mental Status: She is alert and oriented to person, place, and time.     Cranial Nerves: No cranial nerve deficit.  Psychiatric:        Behavior: Behavior normal.        Thought Content: Thought content normal.        Judgment: Judgment normal.        Assessment/Plan: 1. Uncontrolled type 2 diabetes mellitus with hyperglycemia (HCC) - POCT HgB A1C is 6.0 today. Pt is not taking metformin or januvia anymore. Continue control with diet/exercise. May restart gabapentin. - gabapentin (NEURONTIN) 100 MG capsule; Take 1 capsule (100 mg total) by mouth 2 (two) times daily.  Dispense: 90 capsule; Refill: 3  2. Chronic obstructive pulmonary disease, unspecified COPD type ([place]) Stable. Needs refills of inhalers with alternatives that new insurance will cover. - albuterol (VENTOLIN HFA) 108 (90 Base) MCG/ACT inhaler; Inhale 2 puffs into the lungs every 6 (six) hours as needed for wheezing or shortness of breath.  Dispense: 8 g; Refill: 0 - Fluticasone-Salmeterol (ADVAIR DISKUS) 250-50 MCG/DOSE AEPB; Inhale 1 puff into the lungs 2 (two) times daily. Rinse mouth out after.  Dispense: 1 each; Refill: 3 - tiotropium (SPIRIVA HANDIHALER) 18 MCG inhalation capsule; INHALE THE CONTENTS OF ONE CAPSULE BY MOUTH ONCE DAILY. DO NOT SWALLOW CAPSULE.  Dispense: 90 capsule; Refill: 0  3. Aortic atherosclerosis ([place]) Pt refused to take crestor or any statin at this time secondary to intolerance. Discussed the importance of statin to reduce CV risk, she will need to work on diet/exercise and consider  restarting a statin or alternative. Continue plavix and ASA.  4. PAD (peripheral artery disease) (HCC) Toes numb and hx of poor circulation being followed by vascular for possible stent placement. New referral placed now that insurance changed. - Ambulatory referral to Vascular Surgery  5. Benign hypertension Well controlled continue Zestoretic  6. Preoperative clearance Pt has plan for ENT surgery that will require holding plavix/ASA temporarily. Discussed with Dr. Humphrey Rolls and pt may stop these temporarily prior to surgery as requested by surgeon, but should resume once able to do so.   General Counseling: Devita verbalizes understanding of the findings of todays visit and agrees with plan of treatment. I have discussed any further diagnostic evaluation that may be needed or ordered today. We also reviewed her medications today. she has been encouraged to call the office with any questions or concerns that should arise related to todays visit.    Orders Placed This Encounter  Procedures  . Ambulatory referral to Vascular Surgery  . POCT HgB A1C    Meds ordered this encounter  Medications  . albuterol (VENTOLIN HFA) 108 (90 Base) MCG/ACT inhaler    Sig: Inhale 2 puffs into the lungs every 6 (six) hours as needed for wheezing or shortness of breath.    Dispense:  8 g    Refill:  0  . Fluticasone-Salmeterol (ADVAIR DISKUS) 250-50 MCG/DOSE AEPB    Sig: Inhale 1 puff into the lungs 2 (two) times daily. Rinse mouth out after.    Dispense:  1 each    Refill:  3  . tiotropium (SPIRIVA HANDIHALER) 18 MCG inhalation capsule    Sig: INHALE THE CONTENTS OF ONE CAPSULE BY MOUTH ONCE DAILY. DO NOT SWALLOW CAPSULE.    Dispense:  90 capsule    Refill:  0  . gabapentin (NEURONTIN) 100 MG capsule    Sig: Take 1 capsule (100 mg total) by mouth 2 (two) times daily.    Dispense:  90 capsule    Refill:  3    This patient was seen by Drema Dallas, PA-C in collaboration with Dr. Clayborn Bigness as a  part of collaborative care agreement.   Total time spent:30 Minutes Time spent includes review of chart, medications, test results, and follow up plan with the patient.      Dr [name] Internal medicine

## 2020-11-20 ENCOUNTER — Other Ambulatory Visit (INDEPENDENT_AMBULATORY_CARE_PROVIDER_SITE_OTHER): Payer: Self-pay | Admitting: Nurse Practitioner

## 2020-11-20 DIAGNOSIS — I70213 Atherosclerosis of native arteries of extremities with intermittent claudication, bilateral legs: Secondary | ICD-10-CM

## 2020-11-22 ENCOUNTER — Ambulatory Visit (INDEPENDENT_AMBULATORY_CARE_PROVIDER_SITE_OTHER): Payer: Medicare Other

## 2020-11-22 ENCOUNTER — Other Ambulatory Visit: Payer: Self-pay

## 2020-11-22 ENCOUNTER — Encounter (INDEPENDENT_AMBULATORY_CARE_PROVIDER_SITE_OTHER): Payer: Self-pay | Admitting: Vascular Surgery

## 2020-11-22 ENCOUNTER — Ambulatory Visit (INDEPENDENT_AMBULATORY_CARE_PROVIDER_SITE_OTHER): Payer: Medicare Other | Admitting: Vascular Surgery

## 2020-11-22 VITALS — BP 134/92 | HR 93 | Ht 59.0 in | Wt 107.0 lb

## 2020-11-22 DIAGNOSIS — E785 Hyperlipidemia, unspecified: Secondary | ICD-10-CM | POA: Diagnosis not present

## 2020-11-22 DIAGNOSIS — I70213 Atherosclerosis of native arteries of extremities with intermittent claudication, bilateral legs: Secondary | ICD-10-CM | POA: Diagnosis not present

## 2020-11-22 DIAGNOSIS — I739 Peripheral vascular disease, unspecified: Secondary | ICD-10-CM | POA: Diagnosis not present

## 2020-11-22 DIAGNOSIS — I10 Essential (primary) hypertension: Secondary | ICD-10-CM | POA: Diagnosis not present

## 2020-11-22 DIAGNOSIS — E1165 Type 2 diabetes mellitus with hyperglycemia: Secondary | ICD-10-CM

## 2020-11-22 NOTE — Assessment & Plan Note (Signed)
ABIs today are stable at 0.90 on the right and 0.79 on the left with biphasic waveforms.  This is stable compared to her previous studies.  She is having more issues from neuropathic pain at this point and not typical claudication.  I would not recommend any intervention at this point.  I will plan to see her back in 6 months with noninvasive studies or sooner if worsening claudication or rest pain symptoms develop in the interim.

## 2020-11-22 NOTE — Progress Notes (Signed)
MRN : [id number]  Angela Jensen is a 65 y.o. (02-Nov-1955) female who presents with chief complaint of  Chief Complaint  Patient presents with  . Follow-up    U/S  .  History of Present Illness: Patient returns today in follow up of her PAD.  She has undergone multiple previous left lower extremity revascularizations.  She is currently bothered by pain in her toes at night.  This is more of a burning and stinging pain.  Getting up and moving around actually makes it better.  She gets some cramps at night as well.  She does not describe typical claudication symptoms.  No open ulcerations or infection. ABIs today are stable at 0.90 on the right and 0.79 on the left with biphasic waveforms.  This is stable compared to her previous studies.  Current Outpatient Medications  Medication Sig Dispense Refill  . Accu-Chek FastClix Lancets MISC Use as directed twice a day diag E11.65 100 each 1  . albuterol (PROVENTIL) (2.5 MG/3ML) 0.083% nebulizer solution Take 3 mLs (2.5 mg total) by nebulization every 6 (six) hours as needed for wheezing. 75 mL 3  . albuterol (VENTOLIN HFA) 108 (90 Base) MCG/ACT inhaler Inhale 2 puffs into the lungs every 6 (six) hours as needed for wheezing or shortness of breath. 8 g 0  . aspirin EC 81 MG tablet Take 1 tablet (81 mg total) by mouth daily. 150 tablet 2  . clopidogrel (PLAVIX) 75 MG tablet Take 1 tablet by mouth once daily 90 tablet 0  . ferrous sulfate 324 MG TBEC Take 324 mg by mouth.    . Fluticasone-Salmeterol (ADVAIR DISKUS) 250-50 MCG/DOSE AEPB Inhale 1 puff into the lungs 2 (two) times daily. Rinse mouth out after. 1 each 3  . gabapentin (NEURONTIN) 100 MG capsule Take 1 capsule (100 mg total) by mouth 2 (two) times daily. 90 capsule 3  . lisinopril-hydrochlorothiazide (ZESTORETIC) 10-12.5 MG tablet Take 1 tablet by mouth daily. 90 tablet 3  . potassium chloride (KLOR-CON) 10 MEQ tablet Take one tab po qod 45 tablet 1  . tiotropium (SPIRIVA HANDIHALER) 18 MCG  inhalation capsule INHALE THE CONTENTS OF ONE CAPSULE BY MOUTH ONCE DAILY. DO NOT SWALLOW CAPSULE. 90 capsule 0  . glucose blood (ACCU-CHEK AVIVA PLUS) test strip Use as instructed to check blood sugars three times daily.  E11.65 (Patient not taking: Reported on 11/22/2020) 250 each 5   No current facility-administered medications for this visit.    Past Medical History:  Diagnosis Date  . Anemia   . Arthritis   . Asthma   . Atherosclerosis of native arteries of extremity with intermittent claudication ([place]) 05/26/2016  . Cancer [facility]) 2012   Right Lung CA  . COPD (chronic obstructive pulmonary disease) ([place])   . Depression   . Diabetes mellitus without complication [facility])    Patient takes Janumet  . Essential hypertension 05/26/2016  . Hypercholesteremia   . 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 PAD (peripheral artery disease) ([place]) 06/22/2016  . Peripheral vascular disease ([place])   . Personal history of radiation therapy   . Shortness of breath dyspnea    with exertion   . Sialolithiasis   . Sleep apnea   . Wears dentures    full upper and lower    Past Surgical History:  Procedure Laterality Date  . CESAREAN SECTION     x3  . COLONOSCOPY WITH PROPOFOL N/A 06/25/2015   Procedure: COLONOSCOPY WITH PROPOFOL;  Surgeon: Lucilla Lame, MD;  Location: [facility];  Service: Endoscopy;  Laterality: N/A;  . COLONOSCOPY WITH PROPOFOL N/A 07/26/2020   Procedure: COLONOSCOPY WITH PROPOFOL;  Surgeon: Lucilla Lame, MD;  Location: [place];  Service: Endoscopy;  Laterality: N/A;  . CYST REMOVAL LEG     and on shoulder   . ESOPHAGOGASTRODUODENOSCOPY (EGD) WITH PROPOFOL N/A 07/26/2020   Procedure: ESOPHAGOGASTRODUODENOSCOPY (EGD) WITH PROPOFOL;  Surgeon: Lucilla Lame, MD;  Location: [place];  Service: Endoscopy;  Laterality: N/A;  Diabetic - oral meds  . LOWER EXTREMITY ANGIOGRAPHY Left 09/29/2018   Procedure: LOWER EXTREMITY ANGIOGRAPHY;  Surgeon: Algernon Huxley, MD;   Location: [place] CV LAB;  Service: Cardiovascular;  Laterality: Left;  . LUNG BIOPSY  05 15 2013   has lung "spots"  . PERIPHERAL VASCULAR CATHETERIZATION Left 06/01/2016   Procedure: Lower Extremity Angiography;  Surgeon: Algernon Huxley, MD;  Location: [place] CV [facility];  Service: Cardiovascular;  Laterality: Left;  . PERIPHERAL VASCULAR CATHETERIZATION N/A 06/01/2016   Procedure: Abdominal Aortogram w/Lower Extremity;  Surgeon: Algernon Huxley, MD;  Location: [place] [facility];  Service: Cardiovascular;  Laterality: N/A;  . PERIPHERAL VASCULAR CATHETERIZATION  06/01/2016   Procedure: Lower Extremity Intervention;  Surgeon: Algernon Huxley, MD;  Location: [place] CV LAB;  Service: Cardiovascular;;  . PERIPHERAL VASCULAR CATHETERIZATION Right 06/08/2016   Procedure: Lower Extremity Angiography;  Surgeon: Algernon Huxley, MD;  Location: [place] [facility];  Service: Cardiovascular;  Laterality: Right;  . PERIPHERAL VASCULAR CATHETERIZATION  06/08/2016   Procedure: Lower Extremity Intervention;  Surgeon: Algernon Huxley, MD;  Location: [place] [facility];  Service: Cardiovascular;;    Family History  Problem Relation Age of Onset  . Diabetes Mother   . Lung cancer Father   . Diabetes Sister   . Diabetes Maternal Grandmother   . Diabetes Paternal Grandmother   . Diabetes Sister             Social History  Substance Use Topics  . Smoking status: Former Smoker    Packs/day: 1.00    Years: 30.00    Types: Cigarettes    Quit date: 02/06/2010  . Smokeless tobacco: Former Systems developer    Types: Snuff  . Alcohol use No     Comment: hx of etoh use   No IV drug use  No Known Allergies     REVIEW OF SYSTEMS(Negative unless checked)  Constitutional: [] ????Weight loss[] ????Fever[] ????Chills Cardiac:[] ????Chest pain[] ????Chest pressure[] ????Palpitations [] ????Shortness of breath when laying flat [] ????Shortness of breath at rest  [x] ????Shortness of breath with exertion. Vascular: [x] ????Pain in legs with walking[] ????Pain in legsat rest[] ????Pain in legs when laying flat [x] ????Claudication [] ????Pain in feet when walking [] ????Pain in feet at rest [] ????Pain in feet when laying flat [] ????History of DVT [] ????Phlebitis [] ????Swelling in legs [] ????Varicose veins [] ????Non-healing ulcers Pulmonary: [x] ????Uses home oxygen [] ????Productive cough[] ????Hemoptysis [] ????Wheeze [x] ????COPD [] ????Asthma Neurologic: [] ????Dizziness [] ????Blackouts [] ????Seizures [] ????History of stroke [] ????History of TIA[] ????Aphasia [] ????Temporary blindness[] ????Dysphagia [] ????Weaknessor numbness in arms [] ????Weakness or numbnessin legs Musculoskeletal: [x] ????Arthritis [] ????Joint swelling [] ????Joint pain [] ????Low back pain Hematologic:[] ????Easy bruising[] ????Easy bleeding [] ????Hypercoagulable state [] ????Anemic [] ????Hepatitis Gastrointestinal:[] ????Blood in stool[] ????Vomiting blood[] ????Gastroesophageal reflux/heartburn[] ????Abdominal pain Genitourinary: [] ????Chronic kidney disease [] ????Difficulturination [] ????Frequenturination [] ????Burning with urination[] ????Hematuria Skin: [] ????Rashes [] ????Ulcers [] ????Wounds Psychological: [] ????History of anxiety[] ????History of major depression.      Physical Examination  BP (!) 134/92   Pulse 93   Ht 4\' 11"  (1.499 m)   Wt 107 lb (48.5 kg)   BMI 21.61 kg/m  Gen:  WD/WN, NAD Head: [place]/AT, No temporalis wasting.  Ear/Nose/Throat: Hearing grossly intact, nares w/o erythema or drainage Eyes: Conjunctiva clear. Sclera non-icteric Neck: Supple.  Trachea midline Pulmonary:  Good air movement, no use of accessory muscles.  Cardiac: RRR, no JVD Vascular:  Vessel Right Left  Radial Palpable Palpable                          PT 1+ Palpable 1+ Palpable  DP Palpable 1+  Palpable   Gastrointestinal: soft, non-tender/non-distended. No guarding/reflex.  Musculoskeletal: M/S 5/5 throughout.  No deformity or atrophy. No edema. Neurologic: Sensation grossly intact in extremities.  Symmetrical.  Speech is fluent.  Psychiatric: Judgment intact, Mood & affect appropriate for pt's clinical situation. Dermatologic: No rashes or ulcers noted.  No cellulitis or open wounds.       Labs Recent Results (from the past 2160 hour(s))  Ferritin     Status: None   Collection Time: 09/12/20  1:40 PM  Result Value Ref Range   Ferritin 19 11 - 307 ng/mL    Comment: Performed at [facility], [address] [place], [place] [address]  Comprehensive metabolic panel     Status: Abnormal   Collection Time: 09/12/20  1:40 PM  Result Value Ref Range   Sodium 137 135 - 145 mmol/L   Potassium 3.5 3.5 - 5.1 mmol/L   Chloride 98 98 - 111 mmol/L   CO2 30 22 - 32 mmol/L   Glucose, Bld 148 (H) 70 - 99 mg/dL    Comment: Glucose reference range applies only to samples taken after fasting for at least 8 hours.   BUN 16 8 - 23 mg/dL   Creatinine, Ser 0.71 0.44 - 1.00 mg/dL   Calcium 8.8 (L) 8.9 - 10.3 mg/dL   Total Protein 7.4 6.5 - 8.1 g/dL   Albumin 3.7 3.5 - 5.0 g/dL   AST 38 15 - 41 U/L   ALT 28 0 - 44 U/L   Alkaline Phosphatase 75 38 - 126 U/L   Total Bilirubin 0.5 0.3 - 1.2 mg/dL   GFR, Estimated >60 >60 mL/min    Comment: (NOTE) Calculated using the CKD-EPI Creatinine Equation (2021)    Anion gap 9 5 - 15    Comment: Performed at [facility], [address], [place], [place] [address]  CBC with Differential     Status: Abnormal   Collection Time: 09/12/20  1:40 PM  Result Value Ref Range   WBC 4.0 4.0 - 10.5 K/uL   RBC 4.17 3.87 - 5.11 MIL/uL   Hemoglobin 11.4 (L) 12.0 - 15.0 g/dL   HCT 37.2 36.0 - 46.0 %   MCV 89.2 80.0 - 100.0 fL   MCH 27.3 26.0 - 34.0 pg   MCHC 30.6 30.0 - 36.0 g/dL   RDW 15.9 (H) 11.5 - 15.5 %   Platelets 249 150 -  400 K/uL   nRBC 0.0 0.0 - 0.2 %   Neutrophils Relative % 68 %   Neutro Abs 2.8 1.7 - 7.7 K/uL   Lymphocytes Relative 16 %   Lymphs Abs 0.6 (L) 0.7 - 4.0 K/uL   Monocytes Relative 13 %   Monocytes Absolute 0.5 0.1 - 1.0 K/uL   Eosinophils Relative 2 %   Eosinophils Absolute 0.1 0.0 - 0.5 K/uL   Basophils Relative 1 %   Basophils Absolute 0.0 0.0 - 0.1 K/uL   Immature Granulocytes 0 %   Abs Immature Granulocytes 0.01 0.00 - 0.07 K/uL  Comment: Performed at [facility], [address], [place], [place] [address]  I-STAT creatinine     Status: None   Collection Time: 10/25/20  1:01 PM  Result Value Ref Range   Creatinine, Ser 0.70 0.44 - 1.00 mg/dL  POCT HgB A1C     Status: Abnormal   Collection Time: 11/04/20  3:00 PM  Result Value Ref Range   Hemoglobin A1C 6.0 (A) 4.0 - 5.6 %   HbA1c POC (<> result, manual entry)     HbA1c, POC (prediabetic range)     HbA1c, POC (controlled diabetic range)      Radiology CT SOFT TISSUE NECK W CONTRAST  Result Date: 10/25/2020 CLINICAL DATA:  Neoplasm of submandibular gland. EXAM: CT NECK WITH CONTRAST TECHNIQUE: Multidetector CT imaging of the neck was performed using the standard protocol following the bolus administration of intravenous contrast. CONTRAST:  20mL OMNIPAQUE IOHEXOL 300 MG/ML  SOLN COMPARISON:  Ultrasound of the head and neck October 08, 2020 FINDINGS: Pharynx and larynx: Hypodense lesions within the palatine tonsils measuring 4 mm on the right and 11 mm on the left may represent retention cysts (series 2, image 28). Parapharyngeal space is preserved. Mild asymmetry of the laryngeal ventricles. Correlate with direct exam. Salivary glands: Atrophic left submandibular gland with large calculus measuring approximately 1.3 by 1.0 cm. Mild ectasia of the right submandibular main duct which is otherwise normal without evidence of calculus. The bilateral parotid and sublingual glands are unremarkable. Thyroid: 6 mm hypodense  nodule in the left thyroid lobe. Lymph nodes: No cervical lymphadenopathy. Vascular: Atherosclerotic changes of the bilateral carotid bifurcation with high-grade stenosis on the left. Limited intracranial: Negative. Visualized orbits: Negative. Mastoids and visualized paranasal sinuses: Clear. Skeleton: Degenerative changes of the cervical spine. No aggressive bone lesion identified. Upper chest: Centrilobular emphysema. Fibrotic band in the right lung apex. Other: None. IMPRESSION: 1. Atrophic left submandibular gland with large calculus corresponding to findings of prior ultrasound. 2. Mild ectasia of the right submandibular main duct with otherwise normal right submandibular gland without evidence of calculus. 3. Hypodense lesions within the palatine tonsils may represent retention cysts. 4. Mild asymmetry of the laryngeal ventricles. Correlate with direct exam. 5. Centrilobular emphysema. 6. Atherosclerotic changes of the bilateral carotid bifurcation with high-grade stenosis on the left. Emphysema (ICD10-J43.9). Electronically Signed   By: Pedro Earls M.D.   On: 10/25/2020 17:37   VAS [name] ABI WITH/WO TBI  Result Date: 11/22/2020 LOWER EXTREMITY DOPPLER STUDY Indications: Peripheral artery disease, and 09/29/2018 left SFA to pop PTA and              stents.  Vascular Interventions: 06/01/16: PTA/stent of left distal SFA/popliteal                         arteries;                         06/08/16: PTA of left SFA. Comparison Study: 11/03/2019 Performing Technologist: Almira Coaster RVS  Examination Guidelines: A complete evaluation includes at minimum, Doppler waveform signals and systolic blood pressure reading at the level of bilateral brachial, anterior tibial, and posterior tibial arteries, when vessel segments are accessible. Bilateral testing is considered an integral part of a complete examination. Photoelectric Plethysmograph (PPG) waveforms and toe systolic pressure readings are  included as required and additional duplex testing as needed. Limited examinations for reoccurring indications may be performed as noted.  ABI Findings: +---------+------------------+-----+--------+--------+ Right    Rt Pressure (mmHg)IndexWaveformComment  +---------+------------------+-----+--------+--------+ Brachial 138                                     +---------+------------------+-----+--------+--------+ ATA      128               0.88 biphasic         +---------+------------------+-----+--------+--------+ PTA      132               0.90 biphasic         +---------+------------------+-----+--------+--------+ Great Toe118               0.81                  +---------+------------------+-----+--------+--------+ +---------+------------------+-----+--------+-------+ Left     Lt Pressure (mmHg)IndexWaveformComment +---------+------------------+-----+--------+-------+ Brachial 146                                    +---------+------------------+-----+--------+-------+ ATA      91                0.62 biphasic        +---------+------------------+-----+--------+-------+ PTA      116               0.79 biphasic        +---------+------------------+-----+--------+-------+ Great Toe99                0.68 Abnormal        +---------+------------------+-----+--------+-------+ +-------+-----------+-----------+------------+------------+ ABI/TBIToday's ABIToday's TBIPrevious ABIPrevious TBI +-------+-----------+-----------+------------+------------+ Right  .90        .81        1.08        .93          +-------+-----------+-----------+------------+------------+ Left   .79        .68        .73         .43          +-------+-----------+-----------+------------+------------+ Right ABIs appear decreased compared to prior study on 11/03/2019. Left ABIs appear increased compared to prior study on 11/03/2019. RT TBIs appear decreased compared to prior  study on 11/03/2019. Lt TBIs appear to increased compared to prior study on 11/03/2019.  Summary: Right: Resting right ankle-brachial index indicates mild right lower extremity arterial disease. The right toe-brachial index is normal. Left: Resting left ankle-brachial index indicates moderate left lower extremity arterial disease. The left toe-brachial index is abnormal.  *See table(s) above for measurements and observations.  Electronically signed by [name] MD on 11/22/2020 at 10:08:56 AM.    Final     Assessment/Plan Essential hypertension blood pressure control important in reducing the progression of atherosclerotic disease. On appropriate oral medications.   Diabetes ([place]) blood glucose control important in reducing the progression of atherosclerotic disease. Also, involved in wound healing. On appropriate medications.   Hyperlipidemia lipid control important in reducing the progression of atherosclerotic disease. Continue statin therapy  PAD (peripheral artery disease) ([facility]) ABIs today are stable at 0.90 on the right and 0.79 on the left with biphasic waveforms.  This is stable compared to her previous studies.  She is having more issues from neuropathic pain at this point and not typical claudication.  I would not recommend any intervention at this point.  I will plan to see  her back in 6 months with noninvasive studies or sooner if worsening claudication or rest pain symptoms develop in the interim.    [name], MD  11/22/2020 10:46 AM    This note was created with Dragon medical transcription system.  Any errors from dictation are purely unintentional

## 2020-11-26 ENCOUNTER — Ambulatory Visit (INDEPENDENT_AMBULATORY_CARE_PROVIDER_SITE_OTHER): Payer: Medicaid Other | Admitting: Vascular Surgery

## 2020-11-28 ENCOUNTER — Other Ambulatory Visit: Payer: Self-pay

## 2020-11-28 ENCOUNTER — Encounter: Payer: Self-pay | Admitting: Unknown Physician Specialty

## 2020-11-29 NOTE — Discharge Instructions (Signed)

## 2020-12-03 ENCOUNTER — Other Ambulatory Visit: Payer: Medicare Other | Attending: Unknown Physician Specialty

## 2020-12-04 ENCOUNTER — Other Ambulatory Visit: Admission: RE | Admit: 2020-12-04 | Payer: Medicare Other | Source: Ambulatory Visit

## 2020-12-05 ENCOUNTER — Other Ambulatory Visit: Payer: Self-pay

## 2020-12-05 ENCOUNTER — Other Ambulatory Visit
Admission: RE | Admit: 2020-12-05 | Discharge: 2020-12-05 | Disposition: A | Discharge status: Home / Self Care | Payer: Medicare Other | Source: Ambulatory Visit | Attending: Unknown Physician Specialty | Admitting: Unknown Physician Specialty

## 2020-12-05 DIAGNOSIS — Z01812 Encounter for preprocedural laboratory examination: Secondary | ICD-10-CM | POA: Insufficient documentation

## 2020-12-05 DIAGNOSIS — Z20822 Contact with and (suspected) exposure to covid-19: Secondary | ICD-10-CM | POA: Insufficient documentation

## 2020-12-05 LAB — SARS CORONAVIRUS 2 (TAT 6-24 HRS): SARS Coronavirus 2: NEGATIVE

## 2020-12-06 ENCOUNTER — Ambulatory Visit
Admission: RE | Admit: 2020-12-06 | Discharge: 2020-12-06 | Disposition: A | Discharge status: Home / Self Care | Payer: Medicare Other | Attending: Unknown Physician Specialty | Admitting: Unknown Physician Specialty

## 2020-12-06 ENCOUNTER — Ambulatory Visit: Payer: Medicare Other | Admitting: Anesthesiology

## 2020-12-06 ENCOUNTER — Encounter
Admission: RE | Disposition: A | Discharge status: Home / Self Care | Payer: Self-pay | Source: Home / Self Care | Attending: Unknown Physician Specialty

## 2020-12-06 ENCOUNTER — Encounter: Payer: Self-pay | Admitting: Unknown Physician Specialty

## 2020-12-06 DIAGNOSIS — D37032 Neoplasm of uncertain behavior of the submandibular salivary glands: Secondary | ICD-10-CM | POA: Diagnosis present

## 2020-12-06 DIAGNOSIS — Z87891 Personal history of nicotine dependence: Secondary | ICD-10-CM | POA: Diagnosis not present

## 2020-12-06 DIAGNOSIS — K115 Sialolithiasis: Secondary | ICD-10-CM | POA: Diagnosis not present

## 2020-12-06 HISTORY — PX: SUBMANDIBULAR GLAND EXCISION: SHX2456

## 2020-12-06 SURGERY — EXCISION, SUBMANDIBULAR GLAND
Anesthesia: General | Site: Neck | Laterality: Left

## 2020-12-06 MED ORDER — ACETAMINOPHEN 325 MG PO TABS
325.0000 mg | ORAL_TABLET | ORAL | Status: DC | PRN
Start: 2020-12-06 — End: 2020-12-06

## 2020-12-06 MED ORDER — FENTANYL CITRATE (PF) 100 MCG/2ML IJ SOLN
INTRAMUSCULAR | Status: DC | PRN
  Administered 2020-12-06 (×2): 50 ug via INTRAVENOUS

## 2020-12-06 MED ORDER — LIDOCAINE HCL (CARDIAC) PF 100 MG/5ML IV SOSY
PREFILLED_SYRINGE | INTRAVENOUS | Status: DC | PRN
  Administered 2020-12-06: 40 mg via INTRAVENOUS

## 2020-12-06 MED ORDER — LIDOCAINE-EPINEPHRINE 2 %-1:100000 IJ SOLN
INTRAMUSCULAR | Status: DC | PRN
  Administered 2020-12-06: 3 mL

## 2020-12-06 MED ORDER — ONDANSETRON HCL 4 MG/2ML IJ SOLN: 4.0000 mg | Freq: Once | INTRAMUSCULAR | Status: DC | PRN

## 2020-12-06 MED ORDER — OXYCODONE HCL 5 MG PO TABS: 5.0000 mg | ORAL_TABLET | Freq: Once | ORAL | Status: DC | PRN

## 2020-12-06 MED ORDER — PHENYLEPHRINE HCL (PRESSORS) 10 MG/ML IV SOLN
INTRAVENOUS | Status: DC | PRN
  Administered 2020-12-06: 50 ug via INTRAVENOUS
  Administered 2020-12-06 (×5): 100 ug via INTRAVENOUS
  Administered 2020-12-06 (×2): 50 ug via INTRAVENOUS
  Administered 2020-12-06: 100 ug via INTRAVENOUS

## 2020-12-06 MED ORDER — ONDANSETRON HCL 4 MG/2ML IJ SOLN
INTRAMUSCULAR | Status: DC | PRN
  Administered 2020-12-06: 4 mg via INTRAVENOUS

## 2020-12-06 MED ORDER — PROPOFOL 10 MG/ML IV BOLUS
INTRAVENOUS | Status: DC | PRN
  Administered 2020-12-06: 100 mg via INTRAVENOUS

## 2020-12-06 MED ORDER — DEXAMETHASONE SODIUM PHOSPHATE 4 MG/ML IJ SOLN
INTRAMUSCULAR | Status: DC | PRN
  Administered 2020-12-06: 4 mg via INTRAVENOUS

## 2020-12-06 MED ORDER — LACTATED RINGERS IV SOLN: INTRAVENOUS | Status: DC

## 2020-12-06 MED ORDER — SUCCINYLCHOLINE CHLORIDE 20 MG/ML IJ SOLN
INTRAMUSCULAR | Status: DC | PRN
  Administered 2020-12-06: 80 mg via INTRAVENOUS

## 2020-12-06 MED ORDER — ALBUTEROL SULFATE (2.5 MG/3ML) 0.083% IN NEBU
2.5000 mg | INHALATION_SOLUTION | Freq: Once | RESPIRATORY_TRACT | Status: AC
  Administered 2020-12-06: 2.5 mg via RESPIRATORY_TRACT

## 2020-12-06 MED ORDER — MIDAZOLAM HCL 5 MG/5ML IJ SOLN
INTRAMUSCULAR | Status: DC | PRN
  Administered 2020-12-06 (×2): 1 mg via INTRAVENOUS

## 2020-12-06 MED ORDER — OXYCODONE-ACETAMINOPHEN 5-325 MG PO TABS: 1.0000 | ORAL_TABLET | ORAL | 0 refills | Status: DC | PRN

## 2020-12-06 MED ORDER — OXYCODONE HCL 5 MG/5ML PO SOLN: 5.0000 mg | Freq: Once | ORAL | Status: DC | PRN

## 2020-12-06 MED ORDER — ACETAMINOPHEN 160 MG/5ML PO SOLN
325.0000 mg | ORAL | Status: DC | PRN
Start: 2020-12-06 — End: 2020-12-06

## 2020-12-06 MED ORDER — FENTANYL CITRATE (PF) 100 MCG/2ML IJ SOLN
25.0000 ug | INTRAMUSCULAR | Status: DC | PRN
Start: 2020-12-06 — End: 2020-12-06

## 2020-12-06 SURGICAL SUPPLY — 39 items
ADH SKN CLS APL DERMABOND .7 (GAUZE/BANDAGES/DRESSINGS) ×1
APPLICATOR COTTON TIP WD 3 STR (MISCELLANEOUS) ×2 IMPLANT
BLADE SURG 15 STRL LF DISP TIS (BLADE) ×1 IMPLANT
BLADE SURG 15 STRL SS (BLADE)
CANISTER SUCT 1200ML W/VALVE (MISCELLANEOUS) ×2 IMPLANT
CORD BIP STRL DISP 12FT (MISCELLANEOUS) ×2 IMPLANT
DERMABOND ADVANCED (GAUZE/BANDAGES/DRESSINGS) ×1
DERMABOND ADVANCED .7 DNX12 (GAUZE/BANDAGES/DRESSINGS) ×1 IMPLANT
DRAIN TLS ROUND 10FR (DRAIN) IMPLANT
DRSG TEGADERM 2-3/8X2-3/4 SM (GAUZE/BANDAGES/DRESSINGS) ×4 IMPLANT
ELECT CAUTERY BLADE TIP 2.5 (TIP) ×2
ELECT EMG 20MM DUAL (MISCELLANEOUS) ×2
ELECT REM PT RETURN 9FT ADLT (ELECTROSURGICAL) ×2
ELECTRODE CAUTERY BLDE TIP 2.5 (TIP) ×1 IMPLANT
ELECTRODE EMG 20MM DUAL (MISCELLANEOUS) ×1 IMPLANT
ELECTRODE REM PT RTRN 9FT ADLT (ELECTROSURGICAL) ×1 IMPLANT
GLOVE SURG ENC MOIS LTX SZ7.5 (GLOVE) ×4 IMPLANT
GOWN STRL REUS W/ TWL LRG LVL3 (GOWN DISPOSABLE) ×3 IMPLANT
GOWN STRL REUS W/TWL LRG LVL3 (GOWN DISPOSABLE) ×4
HEMOSTAT SURGICEL 2X3 (HEMOSTASIS) ×2 IMPLANT
MARKER SKIN DUAL TIP RULER LAB (MISCELLANEOUS) ×2 IMPLANT
NS IRRIG 500ML POUR BTL (IV SOLUTION) ×2 IMPLANT
PACK HEAD/NECK (MISCELLANEOUS) ×2 IMPLANT
PROBE MONO 100X0.75 ELECT 1.9M (MISCELLANEOUS) ×2 IMPLANT
SHEARS HARMONIC 9CM CVD (BLADE) ×2 IMPLANT
SPONGE KITTNER 5P (MISCELLANEOUS) ×4 IMPLANT
STAPLER SKIN PROX 35W (STAPLE) ×2 IMPLANT
SUT ETHILON 4-0 (SUTURE)
SUT ETHILON 4-0 FS2 18XMFL BLK (SUTURE)
SUT SILK 2 0 (SUTURE) ×2
SUT SILK 2 0 SH (SUTURE) ×1 IMPLANT
SUT SILK 2-0 18XBRD TIE 12 (SUTURE) ×1 IMPLANT
SUT SILK 3 0 (SUTURE) ×2
SUT SILK 3-0 18XBRD TIE 12 (SUTURE) ×1 IMPLANT
SUT VIC AB 4-0 RB1 18 (SUTURE) ×4 IMPLANT
SUTURE ETHLN 4-0 FS2 18XMF BLK (SUTURE) ×1 IMPLANT
SYR EAR/ULCER 2OZ (SYRINGE) ×1 IMPLANT
SYSTEM CHEST DRAIN TLS 7FR (DRAIN) IMPLANT
TOWEL OR 17X26 4PK STRL BLUE (TOWEL DISPOSABLE) ×1 IMPLANT

## 2020-12-06 NOTE — Anesthesia Postprocedure Evaluation (Signed)
Anesthesia Post Note  Patient: Angela Jensen  Procedure(s) Performed: EXCISION SUBMANDIBULAR GLAND (Left Neck)     Patient location during evaluation: PACU Anesthesia Type: General Level of consciousness: awake and alert and oriented Pain management: satisfactory to patient Vital Signs Assessment: post-procedure vital signs reviewed and stable Respiratory status: spontaneous breathing, nonlabored ventilation and respiratory function stable Cardiovascular status: blood pressure returned to baseline and stable Postop Assessment: Adequate PO intake and No signs of nausea or vomiting Anesthetic complications: no   No complications documented.  Raliegh Ip

## 2020-12-06 NOTE — H&P (Signed)
The patient's history has been reviewed, patient examined, no change in status, stable for surgery.  Questions were answered to the patients satisfaction.  

## 2020-12-06 NOTE — Anesthesia Preprocedure Evaluation (Signed)
Anesthesia Evaluation  Patient identified by MRN, date of birth, ID band Patient awake    Reviewed: Allergy & Precautions, H&P , NPO status , Patient's Chart, lab work & pertinent test results  Airway Mallampati: II  TM Distance: >3 FB Neck ROM: full    Dental  (+) Edentulous Upper, Edentulous Lower   Pulmonary sleep apnea , COPD,  oxygen dependent, former smoker,    Pulmonary exam normal breath sounds clear to auscultation       Cardiovascular hypertension, + Peripheral Vascular Disease  Normal cardiovascular exam Rhythm:regular Rate:Normal     Neuro/Psych PSYCHIATRIC DISORDERS    GI/Hepatic   Endo/Other  diabetes  Renal/GU      Musculoskeletal   Abdominal   Peds  Hematology   Anesthesia Other Findings   Reproductive/Obstetrics                             Anesthesia Physical Anesthesia Plan  ASA: III  Anesthesia Plan: General ETT   Post-op Pain Management:    Induction:   PONV Risk Score and Plan: 3 and Treatment may vary due to age or medical condition, Ondansetron, Dexamethasone and Midazolam  Airway Management Planned:   Additional Equipment:   Intra-op Plan:   Post-operative Plan:   Informed Consent: I have reviewed the patients History and Physical, chart, labs and discussed the procedure including the risks, benefits and alternatives for the proposed anesthesia with the patient or authorized representative who has indicated his/her understanding and acceptance.     Dental Advisory Given  Plan Discussed with: CRNA  Anesthesia Plan Comments:         Anesthesia Quick Evaluation

## 2020-12-06 NOTE — Op Note (Signed)
12/06/2020  8:49 AM    Angela Jensen  [id number]   Pre-Op Dx: NEOPLASM OF UNCERTAIN BEHAVIOR, SUBMANDIBULAR GLAND  Post-op Dx: SAME  Proc: Facial nerve monitoring 1 hour; excision left submandibular gland  Surg:  Roena Malady   Assistant: Ros[place] Miners:  GOT  EBL: Less than 10 cc  Comp: None  Findings: Large stone left submandibular gland with sclerotic gland  Procedure: Angela Jensen was identified holding area take the operating placed in supine position.  After general endotracheal anesthesia table was turned 90 degrees.  Facial nerve monitor was applied to the lower face remained on throughout the procedure.  An incision line was marked in a lower skin crease just beneath submandibular gland approximately 33 fingerbreadths below the angle of the mandible.  The left neck was then prepped and draped sterilely and local anesthetic of 2% lidocaine with 100,000's epinephrine was used to inject over the incision line a total of 3 cc was used.  There prepped and draped sterilely 15 blade was used to incise down to and through the platysma muscle.  Hemostasis achieved using the Bovie cautery.  Dissection proceeded slowly through the soft tissue nerve stimulator was used to touch all tissues appeared to be nerve prevent injury to the marginal branch of the facial nerve.  The gland was identified in the soft tissue and platysma retracted superiorly with the marginal branch of the facial nerve.  The gland was easily identified and dissected inferiorly Monarch scalpel was used divide any small feeding vessels and any fibrous bands.  Posterior the gland was then dissected the facial artery was identified there was a branch leading into the gland was divided between hemostats divided and suture ligated using 2-0 silk suture ligatures.  There was several small draining veins which were also divided using the harmonic scalpel.  Posterior gland was then dissected free superior aspect was addressed.  With the  soft tissue and nerve retracted superiorly the fibrous bands were divided using the harmonic scalpel.  The mylohyoid muscle was then identified anteriorly and retracted superiorly the lingual nerve was identified as was the branch into the submandibular ganglion.  The ganglion was then divided using harmonic scalpel as it entered the gland and the lingual nerve was retracted superiorly.  A large stone was identified at the opening of the duct.  The duct was dissected anteriorly beneath the mylohyoid muscle up towards the floor mouth clamp was then placed across the duct it was excised.  A 2-0 silk was then placed around the end portion of the duct.  The gland was then removed in its entirety and sent for permanent section.  Wound was copiously irrigated with saline there was no active bleeding.  Small piece of Surgicel was placed in the wound the platysma layer was closed using 4-0 Vicryl.  The subcutaneous tissues were closed also using 4-0 Vicryl and the skin was closed using Dermabond.  The facial nerve monitor was removed.  The patient was then returned to anesthesia where she was awakened in the operating type cart in stable condition.  Dispo:   Good  Plan: Discharged home follow-up 1 week  Roena Malady  12/06/2020 8:49 AM

## 2020-12-06 NOTE — Anesthesia Procedure Notes (Signed)
Procedure Name: Intubation Date/Time: 12/06/2020 7:41 AM Performed by: Cameron Ali, CRNA Pre-anesthesia Checklist: Patient identified, Emergency Drugs available, Suction available, Patient being monitored and Timeout performed Patient Re-evaluated:Patient Re-evaluated prior to induction Oxygen Delivery Method: Circle system utilized Preoxygenation: Pre-oxygenation with 100% oxygen Induction Type: IV induction Ventilation: Mask ventilation without difficulty Laryngoscope Size: Mac and 3 Grade View: Grade I Tube type: Oral Rae Tube size: 7.0 mm Number of attempts: 1 Placement Confirmation: ETT inserted through vocal cords under direct vision,  positive ETCO2 and breath sounds checked- equal and bilateral Secured at: 20 cm Tube secured with: Tape Dental Injury: Teeth and Oropharynx as per pre-operative assessment

## 2020-12-06 NOTE — Transfer of Care (Signed)
Immediate Anesthesia Transfer of Care Note  Patient: Angela Jensen  Procedure(s) Performed: EXCISION SUBMANDIBULAR GLAND (Left Neck)  Patient Location: PACU  Anesthesia Type: General ETT  Level of Consciousness: awake, alert  and patient cooperative  Airway and Oxygen Therapy: Patient Spontanous Breathing and Patient connected to supplemental oxygen  Post-op Assessment: Post-op Vital signs reviewed, Patient's Cardiovascular Status Stable, Respiratory Function Stable, Patent Airway and No signs of Nausea or vomiting  Post-op Vital Signs: Reviewed and stable  Complications: No complications documented.

## 2020-12-10 LAB — SURGICAL PATHOLOGY

## 2020-12-11 ENCOUNTER — Other Ambulatory Visit: Payer: Self-pay

## 2020-12-11 DIAGNOSIS — J449 Chronic obstructive pulmonary disease, unspecified: Secondary | ICD-10-CM

## 2020-12-11 MED ORDER — ALBUTEROL SULFATE HFA 108 (90 BASE) MCG/ACT IN AERS: 2.0000 | INHALATION_SPRAY | Freq: Four times a day (QID) | RESPIRATORY_TRACT | 3 refills | Status: DC | PRN

## 2020-12-11 MED ORDER — ALBUTEROL SULFATE (2.5 MG/3ML) 0.083% IN NEBU: 2.5000 mg | INHALATION_SOLUTION | Freq: Four times a day (QID) | RESPIRATORY_TRACT | 3 refills | Status: DC | PRN

## 2020-12-16 ENCOUNTER — Other Ambulatory Visit (INDEPENDENT_AMBULATORY_CARE_PROVIDER_SITE_OTHER): Payer: Self-pay | Admitting: Nurse Practitioner

## 2020-12-16 DIAGNOSIS — I739 Peripheral vascular disease, unspecified: Secondary | ICD-10-CM

## 2020-12-20 ENCOUNTER — Other Ambulatory Visit: Payer: Self-pay

## 2020-12-24 ENCOUNTER — Other Ambulatory Visit: Payer: Self-pay

## 2020-12-24 ENCOUNTER — Telehealth (INDEPENDENT_AMBULATORY_CARE_PROVIDER_SITE_OTHER): Payer: Self-pay

## 2020-12-24 MED ORDER — CLOPIDOGREL BISULFATE 75 MG PO TABS: 75.0000 mg | ORAL_TABLET | Freq: Every day | ORAL | 1 refills | Status: DC

## 2020-12-24 NOTE — Telephone Encounter (Signed)
I have called the pt's Rx in to her preferred  pharmacy.

## 2020-12-24 NOTE — Telephone Encounter (Signed)
The pt called and left a VM on the nurses line wanting to know could she have a refill on her Plavix she was last seen on 11/22/2020. Please advise.

## 2021-01-28 ENCOUNTER — Emergency Department: Payer: Medicare Other

## 2021-01-28 ENCOUNTER — Emergency Department
Admission: EM | Admit: 2021-01-28 | Discharge: 2021-01-28 | Disposition: A | Discharge status: Home / Self Care | Payer: Medicare Other | Attending: Emergency Medicine | Admitting: Emergency Medicine

## 2021-01-28 ENCOUNTER — Encounter: Payer: Self-pay | Admitting: Hematology and Oncology

## 2021-01-28 ENCOUNTER — Other Ambulatory Visit: Payer: Self-pay

## 2021-01-28 ENCOUNTER — Encounter: Payer: Self-pay | Admitting: Emergency Medicine

## 2021-01-28 DIAGNOSIS — Z79899 Other long term (current) drug therapy: Secondary | ICD-10-CM | POA: Diagnosis not present

## 2021-01-28 DIAGNOSIS — J45909 Unspecified asthma, uncomplicated: Secondary | ICD-10-CM | POA: Diagnosis not present

## 2021-01-28 DIAGNOSIS — Z7951 Long term (current) use of inhaled steroids: Secondary | ICD-10-CM | POA: Insufficient documentation

## 2021-01-28 DIAGNOSIS — S299XXA Unspecified injury of thorax, initial encounter: Secondary | ICD-10-CM | POA: Diagnosis present

## 2021-01-28 DIAGNOSIS — Z7902 Long term (current) use of antithrombotics/antiplatelets: Secondary | ICD-10-CM | POA: Diagnosis not present

## 2021-01-28 DIAGNOSIS — I10 Essential (primary) hypertension: Secondary | ICD-10-CM | POA: Insufficient documentation

## 2021-01-28 DIAGNOSIS — J449 Chronic obstructive pulmonary disease, unspecified: Secondary | ICD-10-CM | POA: Diagnosis not present

## 2021-01-28 DIAGNOSIS — Z85038 Personal history of other malignant neoplasm of large intestine: Secondary | ICD-10-CM | POA: Diagnosis not present

## 2021-01-28 DIAGNOSIS — W19XXXA Unspecified fall, initial encounter: Secondary | ICD-10-CM | POA: Diagnosis not present

## 2021-01-28 DIAGNOSIS — E119 Type 2 diabetes mellitus without complications: Secondary | ICD-10-CM | POA: Diagnosis not present

## 2021-01-28 DIAGNOSIS — S2231XA Fracture of one rib, right side, initial encounter for closed fracture: Secondary | ICD-10-CM | POA: Diagnosis not present

## 2021-01-28 DIAGNOSIS — Z85118 Personal history of other malignant neoplasm of bronchus and lung: Secondary | ICD-10-CM | POA: Insufficient documentation

## 2021-01-28 DIAGNOSIS — Z87891 Personal history of nicotine dependence: Secondary | ICD-10-CM | POA: Diagnosis not present

## 2021-01-28 LAB — TROPONIN I (HIGH SENSITIVITY): Troponin I (High Sensitivity): 11 ng/L (ref <18)

## 2021-01-28 LAB — CBC WITH DIFFERENTIAL/PLATELET
Abs Immature Granulocytes: 0.01 10*3/uL (ref 0.00–0.07)
Basophils Absolute: 0 10*3/uL (ref 0.0–0.1)
Basophils Relative: 1 %
Eosinophils Absolute: 0.3 10*3/uL (ref 0.0–0.5)
Eosinophils Relative: 6 %
HCT: 35 % — ABNORMAL LOW (ref 36.0–46.0)
Hemoglobin: 11.6 g/dL — ABNORMAL LOW (ref 12.0–15.0)
Immature Granulocytes: 0 %
Lymphocytes Relative: 24 %
Lymphs Abs: 1.1 10*3/uL (ref 0.7–4.0)
MCH: 30.1 pg (ref 26.0–34.0)
MCHC: 33.1 g/dL (ref 30.0–36.0)
MCV: 90.9 fL (ref 80.0–100.0)
Monocytes Absolute: 0.5 10*3/uL (ref 0.1–1.0)
Monocytes Relative: 12 %
Neutro Abs: 2.5 10*3/uL (ref 1.7–7.7)
Neutrophils Relative %: 57 %
Platelets: 270 10*3/uL (ref 150–400)
RBC: 3.85 MIL/uL — ABNORMAL LOW (ref 3.87–5.11)
RDW: 13.7 % (ref 11.5–15.5)
WBC: 4.4 10*3/uL (ref 4.0–10.5)
nRBC: 0 % (ref 0.0–0.2)

## 2021-01-28 LAB — COMPREHENSIVE METABOLIC PANEL
ALT: 22 U/L (ref 0–44)
AST: 23 U/L (ref 15–41)
Albumin: 3.9 g/dL (ref 3.5–5.0)
Alkaline Phosphatase: 75 U/L (ref 38–126)
Anion gap: 9 (ref 5–15)
BUN: 16 mg/dL (ref 8–23)
CO2: 29 mmol/L (ref 22–32)
Calcium: 8.9 mg/dL (ref 8.9–10.3)
Chloride: 102 mmol/L (ref 98–111)
Creatinine, Ser: 0.58 mg/dL (ref 0.44–1.00)
GFR, Estimated: >60 mL/min (ref >60)
Glucose, Bld: 211 mg/dL — ABNORMAL HIGH (ref 70–99)
Potassium: 3.9 mmol/L (ref 3.5–5.1)
Sodium: 140 mmol/L (ref 135–145)
Total Bilirubin: 0.5 mg/dL (ref 0.3–1.2)
Total Protein: 7.2 g/dL (ref 6.5–8.1)

## 2021-01-28 LAB — LIPASE, BLOOD: Lipase: 20 U/L (ref 11–51)

## 2021-01-28 MED ORDER — OXYCODONE-ACETAMINOPHEN 5-325 MG PO TABS
1.0000 | ORAL_TABLET | Freq: Once | ORAL | Status: AC
  Administered 2021-01-28: 1 via ORAL
  Filled 2021-01-28: qty 1

## 2021-01-28 MED ORDER — OXYCODONE-ACETAMINOPHEN 5-325 MG PO TABS: 1.0000 | ORAL_TABLET | Freq: Three times a day (TID) | ORAL | 0 refills | Status: DC | PRN

## 2021-01-28 NOTE — ED Notes (Signed)
ED Provider at bedside. 

## 2021-01-28 NOTE — ED Triage Notes (Signed)
Pt to triage via w/c with no distress noted; pt reports pain to rt side abd/flank & lateral ribcage x mo; st had a fall around same time pain began; denies any accomp symptoms

## 2021-01-28 NOTE — ED Provider Notes (Signed)
[facility] Emergency Department Provider Note   ____________________________________________    I have reviewed the triage vital signs and the nursing notes.   HISTORY  Chief Complaint Fall     HPI Angela Jensen is a 65 y.o. female who presents with complaints of right-sided chest wall pain.  Patient reports this is been hurting for the last 3 weeks.  She reports she had a fall then and since then it has hurt, it seems of gotten a little bit worse recently.  Denies shortness of breath, no fevers chills or cough.  No nausea or vomiting.  Has not take anything for this.  Past Medical History:  Diagnosis Date   Anemia    Arthritis    Asthma    Atherosclerosis of native arteries of extremity with intermittent claudication ([place]) 05/26/2016   Cancer ([place]) 2012   Right Lung CA   COPD (chronic obstructive pulmonary disease) ([place])    Depression    Diabetes mellitus without complication ([place])    Patient takes Janumet   Essential hypertension 05/26/2016   Hypercholesteremia    Oxygen dependent    2L at nite    PAD (peripheral artery disease) ([place]) 06/22/2016   Peripheral vascular disease ([place])    Personal history of radiation therapy    Shortness of breath dyspnea    with exertion    Sialolithiasis    Sleep apnea    Wears dentures    full upper and lower    Patient Active Problem List   Diagnosis Date Noted   Pneumothorax after biopsy 05/03/2020   Pneumothorax on right 05/01/2020   Chest pain on breathing 04/21/2020   Pleuritic chest pain 04/11/2020   Right lower lobe pulmonary nodule 04/02/2020   Iron deficiency anemia 04/02/2020   Pica in adults 04/02/2020   Goals of care, counseling/discussion 03/14/2020   Acute non-recurrent pansinusitis 01/01/2020   Pain in left wrist 09/24/2019   Encounter for general adult medical examination with abnormal findings 06/28/2019   Elevated total protein 01/22/2019   Uncontrolled type 2 diabetes  mellitus with hyperglycemia ([place]) 05/16/2018   Chronic obstructive pulmonary disease ([place]) 05/16/2018   Vitamin D deficiency 05/16/2018   Flu vaccine need 05/16/2018   Dysuria 05/16/2018   Cancer of lower lobe of right lung ([place]) 01/21/2018   Screening for breast cancer 10/12/2017   Personal history of tobacco use, presenting hazards to health 02/03/2017   PAD (peripheral artery disease) ([place]) 06/22/2016   Essential hypertension 05/26/2016   Diabetes ([place]) 05/26/2016   Hyperlipidemia 05/26/2016   Atherosclerosis of native arteries of extremity with intermittent claudication ([place]) 05/26/2016   Uterine leiomyoma 04/30/2016   Elevated CEA 01/31/2016   Special screening for malignant neoplasms, colon    Benign neoplasm of sigmoid colon    Primary lung cancer ([place])    Malignant neoplasm of upper lobe of right lung [facility])     Past Surgical History:  Procedure Laterality Date   CESAREAN SECTION     x3   COLONOSCOPY WITH PROPOFOL N/A 06/25/2015   Procedure: COLONOSCOPY WITH PROPOFOL;  Surgeon: Lucilla Lame, MD;  Location: [facility] ENDOSCOPY;  Service: Endoscopy;  Laterality: N/A;   COLONOSCOPY WITH PROPOFOL N/A 07/26/2020   Procedure: COLONOSCOPY WITH PROPOFOL;  Surgeon: Lucilla Lame, MD;  Location: [place];  Service: Endoscopy;  Laterality: N/A;   CYST REMOVAL LEG     and on shoulder    ESOPHAGOGASTRODUODENOSCOPY (EGD) WITH PROPOFOL N/A 07/26/2020   Procedure: ESOPHAGOGASTRODUODENOSCOPY (EGD)  WITH PROPOFOL;  Surgeon: Lucilla Lame, MD;  Location: [place];  Service: Endoscopy;  Laterality: N/A;  Diabetic - oral meds   LOWER EXTREMITY ANGIOGRAPHY Left 09/29/2018   Procedure: LOWER EXTREMITY ANGIOGRAPHY;  Surgeon: Algernon Huxley, MD;  Location: [place] [facility];  Service: Cardiovascular;  Laterality: Left;   LUNG BIOPSY  05 15 2013   has lung "spots"   PERIPHERAL VASCULAR CATHETERIZATION Left 06/01/2016   Procedure: Lower Extremity Angiography;  Surgeon: Algernon Huxley, MD;   Location: [place] [facility];  Service: Cardiovascular;  Laterality: Left;   PERIPHERAL VASCULAR CATHETERIZATION N/A 06/01/2016   Procedure: Abdominal Aortogram w/Lower Extremity;  Surgeon: Algernon Huxley, MD;  Location: [place] [facility];  Service: Cardiovascular;  Laterality: N/A;   PERIPHERAL VASCULAR CATHETERIZATION  06/01/2016   Procedure: Lower Extremity Intervention;  Surgeon: Algernon Huxley, MD;  Location: [place] [facility];  Service: Cardiovascular;;   PERIPHERAL VASCULAR CATHETERIZATION Right 06/08/2016   Procedure: Lower Extremity Angiography;  Surgeon: Algernon Huxley, MD;  Location: [place] [facility];  Service: Cardiovascular;  Laterality: Right;   PERIPHERAL VASCULAR CATHETERIZATION  06/08/2016   Procedure: Lower Extremity Intervention;  Surgeon: Algernon Huxley, MD;  Location: [place] [facility];  Service: Cardiovascular;;   SUBMANDIBULAR GLAND EXCISION Left 12/06/2020   Procedure: EXCISION SUBMANDIBULAR GLAND;  Surgeon: Beverly Gust, MD;  Location: [facility];  Service: ENT;  Laterality: Left;  needs to be first case Diabetic - diet controlled    Prior to Admission medications   Medication Sig Start Date End Date Taking? Authorizing Provider  oxyCODONE-acetaminophen (PERCOCET) 5-325 MG tablet Take 1 tablet by mouth every 8 (eight) hours as needed for severe pain. 01/28/21 01/28/22 Yes Lavonia Drafts, MD  Accu-Chek FastClix Lancets MISC Use as directed twice a day diag E11.65 03/15/19   Ronnell Freshwater, NP  albuterol (PROVENTIL) (2.5 MG/3ML) 0.083% nebulizer solution Take 3 mLs (2.5 mg total) by nebulization every 6 (six) hours as needed for wheezing. 12/11/20   [name], MD  albuterol (VENTOLIN HFA) 108 (90 Base) MCG/ACT inhaler Inhale 2 puffs into the lungs every 6 (six) hours as needed for wheezing or shortness of breath. 12/11/20   [name], MD  clopidogrel (PLAVIX) 75 MG tablet Take 1 tablet (75 mg total) by mouth daily. 12/24/20   [name], MD  ferrous  sulfate 324 MG TBEC Take 324 mg by mouth.    [provider]  Fluticasone-Salmeterol (ADVAIR DISKUS) 250-50 MCG/DOSE AEPB Inhale 1 puff into the lungs 2 (two) times daily. Rinse mouth out after. 11/04/20   McDonough, Si Gaul, PA-C  gabapentin (NEURONTIN) 100 MG capsule Take 1 capsule (100 mg total) by mouth 2 (two) times daily. 11/04/20   McDonough, Lauren K, PA-C  glucose blood (ACCU-CHEK AVIVA PLUS) test strip Use as instructed to check blood sugars three times daily.  E11.65 Patient not taking: Reported on 11/22/2020 03/16/19   Ronnell Freshwater, NP  lisinopril-hydrochlorothiazide (ZESTORETIC) 10-12.5 MG tablet Take 1 tablet by mouth daily. 09/20/20   [name], MD  potassium chloride (KLOR-CON) 10 MEQ tablet Take one tab po qod 09/20/20   [name], MD  tiotropium (SPIRIVA HANDIHALER) 18 MCG inhalation capsule INHALE THE CONTENTS OF ONE CAPSULE BY MOUTH ONCE DAILY. DO NOT SWALLOW CAPSULE. 11/04/20   McDonough, Si Gaul, PA-C     Allergies Patient has no known allergies.  Family History  Problem Relation Age of Onset  Lung cancer Father    Diabetes Mother    Hypercholesterolemia Mother    Diabetes Sister    Diabetes Maternal Grandmother    Diabetes Paternal Grandmother    Diabetes Sister    Heart attack Brother    Coronary artery disease Brother    Vascular Disease Brother    Hypertension Sister    Heart attack Brother     Social History Social History   Tobacco Use   Smoking status: Former    Packs/day: 1.00    Years: 37.00    Pack years: 37.00    Types: Cigarettes    Quit date: 02/06/2010    Years since quitting: 10.9   Smokeless tobacco: Former    Types: Snuff  Vaping Use   Vaping Use: Never used  Substance Use Topics   Alcohol use: Not Currently    Alcohol/week: 5.0 standard drinks    Types: 5 Cans of beer per week    Comment: /h x of alcohol abuse -stopped 2012- now drinks 5 beer per w   Drug use: Not Currently    Types: Marijuana, "Crack" cocaine,  Cocaine    Comment: hx of cocaine use- last use 2015; last use sat marijuana6/22/19,     Review of Systems  Constitutional: No fever/chills Eyes: No visual changes.  ENT: No sore throat. Cardiovascular: As above Respiratory: Denies shortness of breath. Gastrointestinal: No abdominal pain.   Genitourinary: Negative for dysuria. Musculoskeletal: Negative for back pain. Skin: Negative for rash. Neurological: Negative for headaches    ____________________________________________   PHYSICAL EXAM:  VITAL SIGNS: ED Triage Vitals  Enc Vitals Group     BP 01/28/21 0656 (!) 158/100     Pulse Rate 01/28/21 0656 (!) 55     Resp 01/28/21 0656 20     Temp 01/28/21 0656 98.2 F (36.8 C)     Temp Source 01/28/21 0656 Oral     SpO2 01/28/21 0656 97 %     Weight 01/28/21 0654 49.4 kg (109 lb)     Height 01/28/21 0654 1.499 m (4\' 11" )     Head Circumference --      Peak Flow --      Pain Score 01/28/21 0653 10     Pain Loc --      Pain Edu? --      Excl. in [place]? --     Constitutional: Alert and oriented.   Nose: No congestion/rhinnorhea. Mouth/Throat: Mucous membranes are moist.   Neck:  Painless ROM Cardiovascular: Normal rate, regular rhythm. Grossly normal heart sounds.  Good peripheral circulation.  Tenderness palpation along the right lateral chest wall inferiorly, no bony normalities palpated, no rash or bruising.  Palpating this area replicates her pain Respiratory: Normal respiratory effort.  No retractions. Lungs CTAB. Gastrointestinal: Soft and nontender. No distention.    Musculoskeletal: No lower extremity tenderness nor edema.  Warm and well perfused Neurologic:  Normal speech and language. No gross focal neurologic deficits are appreciated.  Skin:  Skin is warm, dry and intact. No rash noted. Psychiatric: Mood and affect are normal. Speech and behavior are normal.  ____________________________________________   LABS (all labs ordered are listed, but only abnormal  results are displayed)  Labs Reviewed  CBC WITH DIFFERENTIAL/PLATELET - Abnormal; Notable for the following components:      Result Value   RBC 3.85 (*)    Hemoglobin 11.6 (*)    HCT 35.0 (*)    All other components within normal limits  COMPREHENSIVE METABOLIC  PANEL - Abnormal; Notable for the following components:   Glucose, Bld 211 (*)    All other components within normal limits  LIPASE, BLOOD  TROPONIN I (HIGH SENSITIVITY)   ____________________________________________  EKG  ED ECG REPORT I, Lavonia Drafts, the attending physician, personally viewed and interpreted this ECG.  Date: 01/28/2021  Rhythm: Sinus tachycardia QRS Axis: normal Intervals: normal ST/T Wave abnormalities: normal Narrative Interpretation: no evidence of acute ischemia  ____________________________________________  RADIOLOGY  Rib x-ray reviewed by me, seventh rib fracture not significantly changed from prior ____________________________________________   PROCEDURES  Procedure(s) performed: No  Procedures   Critical Care performed: No ____________________________________________   INITIAL IMPRESSION / ASSESSMENT AND PLAN / ED COURSE  Pertinent labs & imaging results that were available during my care of the patient were reviewed by me and considered in my medical decision making (see chart for details).   Patient with right-sided rib fracture on x-ray at the place where she has pain with tenderness.  It appears this fracture is old, question whether she may have exacerbated this after her fall several weeks ago..  Otherwise exam is reassuring, will provide analgesics, outpatient follow-up recommended.    ____________________________________________   FINAL CLINICAL IMPRESSION(S) / ED DIAGNOSES  Final diagnoses:  Chest wall injury, initial encounter  Closed fracture of one rib of right side, initial encounter        Note:  This document was prepared using Dragon voice  recognition software and may include unintentional dictation errors.    Lavonia Drafts, MD 01/28/21 1409

## 2021-02-06 ENCOUNTER — Encounter: Payer: Self-pay | Admitting: Physician Assistant

## 2021-02-06 ENCOUNTER — Other Ambulatory Visit: Payer: Self-pay

## 2021-02-06 ENCOUNTER — Ambulatory Visit (INDEPENDENT_AMBULATORY_CARE_PROVIDER_SITE_OTHER): Payer: Medicare Other | Admitting: Physician Assistant

## 2021-02-06 DIAGNOSIS — I6523 Occlusion and stenosis of bilateral carotid arteries: Secondary | ICD-10-CM

## 2021-02-06 DIAGNOSIS — I739 Peripheral vascular disease, unspecified: Secondary | ICD-10-CM | POA: Diagnosis not present

## 2021-02-06 DIAGNOSIS — E1165 Type 2 diabetes mellitus with hyperglycemia: Secondary | ICD-10-CM

## 2021-02-06 DIAGNOSIS — E041 Nontoxic single thyroid nodule: Secondary | ICD-10-CM

## 2021-02-06 DIAGNOSIS — I700 Atherosclerosis of aorta: Secondary | ICD-10-CM | POA: Diagnosis not present

## 2021-02-06 DIAGNOSIS — I10 Essential (primary) hypertension: Secondary | ICD-10-CM

## 2021-02-06 LAB — POCT GLYCOSYLATED HEMOGLOBIN (HGB A1C): Hemoglobin A1C: 7.5 % — AB (ref 4.0–5.6)

## 2021-02-06 MED ORDER — METFORMIN HCL 500 MG PO TABS: 500.0000 mg | ORAL_TABLET | Freq: Every day | ORAL | 3 refills | Status: DC

## 2021-02-06 NOTE — Progress Notes (Signed)
[facility] [place], [place] [address]  Internal MEDICINE  Office Visit Note  Patient Name: Angela Jensen  [id number]  [id number]  Date of Service: 02/09/2021  Chief Complaint  Patient presents with   Follow-up    Cracked rib since January, fell again in May and re-injured ribs    Depression   Hyperlipidemia   Hypertension   Sleep Apnea   Diabetes   COPD   Asthma   Anemia   Quality Metric Gaps    Covid booster, shingrix shot done in April, dexa    HPI Pt is here for routine follow up -BG at home 120-140 usually, does report a few high readings and will take a metformin tab as needed. -Stopped ropinirole, but still taking gabapentin and this helps -Stopped crestor due to cramping -Got back in with vascular and was told everything looked ok and goes back in Oct. -Still takes plavix and ASA, iron supplement as well -R shoulder doing better since taking mobic -Goes back to hematology in July following IDA -Goes back to oncology for follow up CT in a few months -Saw ENT for salivary stone which was removed in left submandibular gland and told she didn't need any further follow up. They repeated her thyroid US but was told no intervention was needed. On repeat benign 59mm cystic nodule on right was seen. However on CT soft tissue neck found  33mm hypodense nodule on left. Additionally found significant atherosclerotic changes in carotid bifurcation with high grade stenosis on left--will need additional studies. Followed by vascular for PAD, but not carotid stenosis. -She reports she fell a few weeks ago and thought she made her broken ribs worse, however imaging in ED shows stable right lateral seventh rib fracture with no new displacement.  Current Medication: Outpatient Encounter Medications as of 02/06/2021  Medication Sig   Accu-Chek FastClix Lancets MISC Use as directed twice a day diag E11.65   albuterol (PROVENTIL) (2.5 MG/3ML) 0.083% nebulizer  solution Take 3 mLs (2.5 mg total) by nebulization every 6 (six) hours as needed for wheezing.   albuterol (VENTOLIN HFA) 108 (90 Base) MCG/ACT inhaler Inhale 2 puffs into the lungs every 6 (six) hours as needed for wheezing or shortness of breath.   clopidogrel (PLAVIX) 75 MG tablet Take 1 tablet (75 mg total) by mouth daily.   ferrous sulfate 324 MG TBEC Take 324 mg by mouth.   Fluticasone-Salmeterol (ADVAIR DISKUS) 250-50 MCG/DOSE AEPB Inhale 1 puff into the lungs 2 (two) times daily. Rinse mouth out after.   gabapentin (NEURONTIN) 100 MG capsule Take 1 capsule (100 mg total) by mouth 2 (two) times daily.   glucose blood (ACCU-CHEK AVIVA PLUS) test strip Use as instructed to check blood sugars three times daily.  E11.65 (Patient taking differently: Use as instructed to check blood sugars three times daily.  E11.65)   lisinopril-hydrochlorothiazide (ZESTORETIC) 10-12.5 MG tablet Take 1 tablet by mouth daily.   metFORMIN (GLUCOPHAGE) 500 MG tablet Take 1 tablet (500 mg total) by mouth daily with breakfast.   potassium chloride (KLOR-CON) 10 MEQ tablet Take one tab po qod   tiotropium (SPIRIVA HANDIHALER) 18 MCG inhalation capsule INHALE THE CONTENTS OF ONE CAPSULE BY MOUTH ONCE DAILY. DO NOT SWALLOW CAPSULE.   [DISCONTINUED] oxyCODONE-acetaminophen (PERCOCET) 5-325 MG tablet Take 1 tablet by mouth every 8 (eight) hours as needed for severe pain. (Patient not taking: Reported on 02/06/2021)   No facility-administered encounter medications on file as of 02/06/2021.  Surgical History: Past Surgical History:  Procedure Laterality Date   CESAREAN SECTION     x3   COLONOSCOPY WITH PROPOFOL N/A 06/25/2015   Procedure: COLONOSCOPY WITH PROPOFOL;  Surgeon: Lucilla Lame, MD;  Location: [facility];  Service: Endoscopy;  Laterality: N/A;   COLONOSCOPY WITH PROPOFOL N/A 07/26/2020   Procedure: COLONOSCOPY WITH PROPOFOL;  Surgeon: Lucilla Lame, MD;  Location: [place];  Service: Endoscopy;   Laterality: N/A;   CYST REMOVAL LEG     and on shoulder    ESOPHAGOGASTRODUODENOSCOPY (EGD) WITH PROPOFOL N/A 07/26/2020   Procedure: ESOPHAGOGASTRODUODENOSCOPY (EGD) WITH PROPOFOL;  Surgeon: Lucilla Lame, MD;  Location: [place];  Service: Endoscopy;  Laterality: N/A;  Diabetic - oral meds   LOWER EXTREMITY ANGIOGRAPHY Left 09/29/2018   Procedure: LOWER EXTREMITY ANGIOGRAPHY;  Surgeon: Algernon Huxley, MD;  Location: [place] [facility];  Service: Cardiovascular;  Laterality: Left;   LUNG BIOPSY  05 15 2013   has lung "spots"   PERIPHERAL VASCULAR CATHETERIZATION Left 06/01/2016   Procedure: Lower Extremity Angiography;  Surgeon: Algernon Huxley, MD;  Location: [place] [facility];  Service: Cardiovascular;  Laterality: Left;   PERIPHERAL VASCULAR CATHETERIZATION N/A 06/01/2016   Procedure: Abdominal Aortogram w/Lower Extremity;  Surgeon: Algernon Huxley, MD;  Location: [place] [facility];  Service: Cardiovascular;  Laterality: N/A;   PERIPHERAL VASCULAR CATHETERIZATION  06/01/2016   Procedure: Lower Extremity Intervention;  Surgeon: Algernon Huxley, MD;  Location: [place] [facility];  Service: Cardiovascular;;   PERIPHERAL VASCULAR CATHETERIZATION Right 06/08/2016   Procedure: Lower Extremity Angiography;  Surgeon: Algernon Huxley, MD;  Location: [place] [facility];  Service: Cardiovascular;  Laterality: Right;   PERIPHERAL VASCULAR CATHETERIZATION  06/08/2016   Procedure: Lower Extremity Intervention;  Surgeon: Algernon Huxley, MD;  Location: [place] [facility];  Service: Cardiovascular;;   SUBMANDIBULAR GLAND EXCISION Left 12/06/2020   Procedure: EXCISION SUBMANDIBULAR GLAND;  Surgeon: Beverly Gust, MD;  Location: [place];  Service: ENT;  Laterality: Left;  needs to be first case Diabetic - diet controlled    Medical History: Past Medical History:  Diagnosis Date   Anemia    Arthritis    Asthma    Atherosclerosis of native arteries of extremity with intermittent  claudication ([place]) 05/26/2016   Cancer ([place]) 2012   Right Lung CA   COPD (chronic obstructive pulmonary disease) ([place])    Depression    Diabetes mellitus without complication ([place])    Patient takes Janumet   Essential hypertension 05/26/2016   Hypercholesteremia    Oxygen dependent    2L at nite    PAD (peripheral artery disease) ([place]) 06/22/2016   Peripheral vascular disease ([place])    Personal history of radiation therapy    Shortness of breath dyspnea    with exertion    Sialolithiasis    Sleep apnea    Wears dentures    full upper and lower    Family History: Family History  Problem Relation Age of Onset   Lung cancer Father    Diabetes Mother    Hypercholesterolemia Mother    Diabetes Sister    Diabetes Maternal Grandmother    Diabetes Paternal Grandmother    Diabetes Sister    Heart attack Brother    Coronary artery disease Brother    Vascular Disease Brother    Hypertension Sister    Heart attack Brother     Social History   Socioeconomic History   Marital status:  Widowed    Spouse name: Not on file   Number of children: Not on file   Years of education: Not on file   Highest education level: Not on file  Occupational History   Not on file  Tobacco Use   Smoking status: Former    Packs/day: 1.00    Years: 37.00    Pack years: 37.00    Types: Cigarettes    Quit date: 02/06/2010    Years since quitting: 11.0   Smokeless tobacco: Former    Types: Snuff  Vaping Use   Vaping Use: Never used  Substance and Sexual Activity   Alcohol use: Not Currently    Alcohol/week: 5.0 standard drinks    Types: 5 Cans of beer per week    Comment: /h x of alcohol abuse -stopped 2012- now drinks 5 beer per w   Drug use: Not Currently    Types: Marijuana, "Crack" cocaine, Cocaine    Comment: hx of cocaine use- last use 2015; last use sat marijuana6/22/19,    Sexual activity: Yes  Other Topics Concern   Not on file  Social History Narrative   Lives with Significant  Other x 43 years   Social Determinants of Health   Financial Resource Strain: Not on file  Food Insecurity: Not on file  Transportation Needs: Not on file  Physical Activity: Not on file  Stress: Not on file  Social Connections: Not on file  Intimate Partner Violence: Not on file      Review of Systems  Constitutional:  Negative for chills, fatigue and unexpected weight change.  HENT:  Negative for congestion, postnasal drip, rhinorrhea, sneezing and sore throat.   Eyes:  Negative for redness.  Respiratory:  Negative for cough, chest tightness and shortness of breath.   Cardiovascular:  Negative for chest pain and palpitations.  Gastrointestinal:  Negative for abdominal pain, constipation, diarrhea, nausea and vomiting.  Genitourinary:  Negative for dysuria and frequency.  Musculoskeletal:  Positive for arthralgias and myalgias. Negative for back pain, joint swelling and neck pain.       Right rib pain  Skin:  Negative for rash.  Neurological: Negative.  Negative for tremors and numbness.  Hematological:  Negative for adenopathy. Does not bruise/bleed easily.  Psychiatric/Behavioral:  Negative for behavioral problems (Depression), sleep disturbance and suicidal ideas. The patient is not nervous/anxious.    Vital Signs: BP 100/70   Pulse (!) 55   Temp (!) 97.1 F (36.2 C)   Resp 16   Ht 4\' 11"  (1.499 m)   Wt 110 lb 3.2 oz (50 kg)   SpO2 96%   BMI 22.26 kg/m    Physical Exam Vitals and nursing note reviewed.  Constitutional:      General: She is not in acute distress.    Appearance: She is well-developed and normal weight. She is not diaphoretic.  HENT:     Head: Normocephalic and atraumatic.     Mouth/Throat:     Pharynx: No oropharyngeal exudate.  Eyes:     Pupils: Pupils are equal, round, and reactive to light.  Neck:     Thyroid: No thyromegaly.     Vascular: No JVD.     Trachea: No tracheal deviation.  Cardiovascular:     Rate and Rhythm: Normal rate and  regular rhythm.     Heart sounds: Normal heart sounds. No murmur heard.   No friction rub. No gallop.  Pulmonary:     Effort: Pulmonary effort is normal. No  respiratory distress.     Breath sounds: No wheezing or rales.  Chest:     Chest wall: No tenderness.  Abdominal:     General: Bowel sounds are normal.     Palpations: Abdomen is soft.  Musculoskeletal:        General: Tenderness present. Normal range of motion.     Cervical back: Normal range of motion and neck supple.     Comments: Tender right chest wall  Lymphadenopathy:     Cervical: No cervical adenopathy.  Skin:    General: Skin is warm and dry.  Neurological:     Mental Status: She is alert and oriented to person, place, and time.     Cranial Nerves: No cranial nerve deficit.  Psychiatric:        Behavior: Behavior normal.        Thought Content: Thought content normal.        Judgment: Judgment normal.       Assessment/Plan: 1. Uncontrolled type 2 diabetes mellitus with hyperglycemia (HCC) - POCT HgB A1C is 7.5, will restart on 500mg  metformin daily and improve diet and exercise - metFORMIN (GLUCOPHAGE) 500 MG tablet; Take 1 tablet (500 mg total) by mouth daily with breakfast.  Dispense: 90 tablet; Refill: 3  2. Essential hypertension Will continue current medications. BP and HR on lower side today however pt has no symptoms and is generally normal at home. May need to consider lowing dose if continues to stay low.  3. PAD (peripheral artery disease) ([place]) Followed by vascular, continue plavix and ASA  4. Aortic atherosclerosis (HCC) Continue Plavix and ASA, pt could not tolerate statin--will start on fish oil  5. Bilateral carotid artery stenosis Seen on  on CT soft tissue neck--will need carotid US vs CTA  6. Thyroid nodule incidentally noted on imaging study Referred to ENT due to hx of lung CA, however on repeat imaging determined to be benign in appearance and smaller than originally believed. Did  have submandibular gland removed due to large stone. Will monitor and consider f/u US in 1 yr   General Counseling: Cyril verbalizes understanding of the findings of todays visit and agrees with plan of treatment. I have discussed any further diagnostic evaluation that may be needed or ordered today. We also reviewed her medications today. she has been encouraged to call the office with any questions or concerns that should arise related to todays visit.    Orders Placed This Encounter  Procedures   POCT HgB A1C    Meds ordered this encounter  Medications   metFORMIN (GLUCOPHAGE) 500 MG tablet    Sig: Take 1 tablet (500 mg total) by mouth daily with breakfast.    Dispense:  90 tablet    Refill:  3    Do not fill until pt requests     This patient was seen by Drema Dallas, PA-C in collaboration with Dr. Clayborn Bigness as a part of collaborative care agreement.   Total time spent:35 Minutes Time spent includes review of chart, medications, test results, and follow up plan with the patient.      Dr [name] Internal medicine

## 2021-03-10 ENCOUNTER — Ambulatory Visit: Payer: Medicare Other | Admitting: Nurse Practitioner

## 2021-03-10 ENCOUNTER — Other Ambulatory Visit: Payer: Self-pay

## 2021-03-10 ENCOUNTER — Encounter: Payer: Self-pay | Admitting: Nurse Practitioner

## 2021-03-10 VITALS — BP 139/90 | HR 99 | Temp 98.0°F | Resp 16 | Ht <= 58 in | Wt 112.6 lb

## 2021-03-10 DIAGNOSIS — J449 Chronic obstructive pulmonary disease, unspecified: Secondary | ICD-10-CM | POA: Diagnosis not present

## 2021-03-10 DIAGNOSIS — R0602 Shortness of breath: Secondary | ICD-10-CM | POA: Diagnosis not present

## 2021-03-10 MED ORDER — TRELEGY ELLIPTA 100-62.5-25 MCG/INH IN AEPB: 1.0000 | INHALATION_SPRAY | Freq: Every day | RESPIRATORY_TRACT | 11 refills | Status: DC

## 2021-03-10 MED ORDER — TETANUS-DIPHTH-ACELL PERTUSSIS 5-2.5-18.5 LF-MCG/0.5 IM SUSY: 0.5000 mL | PREFILLED_SYRINGE | Freq: Once | INTRAMUSCULAR | 0 refills | Status: AC

## 2021-03-10 MED ORDER — ZOSTER VAC RECOMB ADJUVANTED 50 MCG/0.5ML IM SUSR: 0.5000 mL | Freq: Once | INTRAMUSCULAR | 0 refills | Status: AC

## 2021-03-10 NOTE — Progress Notes (Signed)
[facility] [place], [place] [address]  Internal MEDICINE  Office Visit Note  Patient Name: Angela Jensen  [id number]  [id number]  Date of Service: 03/10/2021  Chief Complaint  Patient presents with   Follow-up    Pulm, patient can not complete Spirometry due to broken ribs     HPI Sammye presents for a pulmonary follow up visit for COPD. She had a fall in the past few months and has some broken ribs that are healing. She is unable to complete the in-office spirometry for today's visit. She is currently using advair and spiriva but reports they are not helping her breathing as well as she would like and she still feels short of breath sometimes.  She has another pulmonary visit for PFTs in October and will keep that appointment and will do the PFTs as long as her rib fractures have healed more and are not causing her so much pain.  Discussed trying an alternate maintenance inhaler, discussed trelegy ellipta and Nilda is open to trying it.    Current Medication: Outpatient Encounter Medications as of 03/10/2021  Medication Sig   Accu-Chek FastClix Lancets MISC Use as directed twice a day diag E11.65   albuterol (PROVENTIL) (2.5 MG/3ML) 0.083% nebulizer solution Take 3 mLs (2.5 mg total) by nebulization every 6 (six) hours as needed for wheezing.   albuterol (VENTOLIN HFA) 108 (90 Base) MCG/ACT inhaler Inhale 2 puffs into the lungs every 6 (six) hours as needed for wheezing or shortness of breath.   clopidogrel (PLAVIX) 75 MG tablet Take 1 tablet (75 mg total) by mouth daily.   ferrous sulfate 324 MG TBEC Take 324 mg by mouth.   Fluticasone-Umeclidin-Vilant (TRELEGY ELLIPTA) 100-62.5-25 MCG/INH AEPB Inhale 1 puff into the lungs daily.   gabapentin (NEURONTIN) 100 MG capsule Take 1 capsule (100 mg total) by mouth 2 (two) times daily.   glucose blood (ACCU-CHEK AVIVA PLUS) test strip Use as instructed to check blood sugars three times daily.  E11.65 (Patient taking  differently: Use as instructed to check blood sugars three times daily.  E11.65)   lisinopril-hydrochlorothiazide (ZESTORETIC) 10-12.5 MG tablet Take 1 tablet by mouth daily.   potassium chloride (KLOR-CON) 10 MEQ tablet Take one tab po qod   [DISCONTINUED] Fluticasone-Salmeterol (ADVAIR DISKUS) 250-50 MCG/DOSE AEPB Inhale 1 puff into the lungs 2 (two) times daily. Rinse mouth out after.   [DISCONTINUED] metFORMIN (GLUCOPHAGE) 500 MG tablet Take 1 tablet (500 mg total) by mouth daily with breakfast.   [DISCONTINUED] Tdap (BOOSTRIX) 5-2.5-18.5 LF-MCG/0.5 injection Inject 0.5 mLs into the muscle once.   [DISCONTINUED] tiotropium (SPIRIVA HANDIHALER) 18 MCG inhalation capsule INHALE THE CONTENTS OF ONE CAPSULE BY MOUTH ONCE DAILY. DO NOT SWALLOW CAPSULE.   [DISCONTINUED] Zoster Vaccine Adjuvanted (SHINGRIX) injection Inject 0.5 mLs into the muscle once.   No facility-administered encounter medications on file as of 03/10/2021.    Surgical History: Past Surgical History:  Procedure Laterality Date   CESAREAN SECTION     x3   COLONOSCOPY WITH PROPOFOL N/A 06/25/2015   Procedure: COLONOSCOPY WITH PROPOFOL;  Surgeon: Lucilla Lame, MD;  Location: [facility];  Service: Endoscopy;  Laterality: N/A;   COLONOSCOPY WITH PROPOFOL N/A 07/26/2020   Procedure: COLONOSCOPY WITH PROPOFOL;  Surgeon: Lucilla Lame, MD;  Location: [place];  Service: Endoscopy;  Laterality: N/A;   CYST REMOVAL LEG     and on shoulder    ESOPHAGOGASTRODUODENOSCOPY (EGD) WITH PROPOFOL N/A 07/26/2020   Procedure: ESOPHAGOGASTRODUODENOSCOPY (EGD) WITH PROPOFOL;  Surgeon: Lucilla Lame, MD;  Location: [place];  Service: Endoscopy;  Laterality: N/A;  Diabetic - oral meds   LOWER EXTREMITY ANGIOGRAPHY Left 09/29/2018   Procedure: LOWER EXTREMITY ANGIOGRAPHY;  Surgeon: Algernon Huxley, MD;  Location: [place] [facility];  Service: Cardiovascular;  Laterality: Left;   LUNG BIOPSY  05 15 2013   has lung "spots"    PERIPHERAL VASCULAR CATHETERIZATION Left 06/01/2016   Procedure: Lower Extremity Angiography;  Surgeon: Algernon Huxley, MD;  Location: [place] [facility];  Service: Cardiovascular;  Laterality: Left;   PERIPHERAL VASCULAR CATHETERIZATION N/A 06/01/2016   Procedure: Abdominal Aortogram w/Lower Extremity;  Surgeon: Algernon Huxley, MD;  Location: [place] [facility];  Service: Cardiovascular;  Laterality: N/A;   PERIPHERAL VASCULAR CATHETERIZATION  06/01/2016   Procedure: Lower Extremity Intervention;  Surgeon: Algernon Huxley, MD;  Location: [place] [facility];  Service: Cardiovascular;;   PERIPHERAL VASCULAR CATHETERIZATION Right 06/08/2016   Procedure: Lower Extremity Angiography;  Surgeon: Algernon Huxley, MD;  Location: [place] [facility];  Service: Cardiovascular;  Laterality: Right;   PERIPHERAL VASCULAR CATHETERIZATION  06/08/2016   Procedure: Lower Extremity Intervention;  Surgeon: Algernon Huxley, MD;  Location: [place] [facility];  Service: Cardiovascular;;   SUBMANDIBULAR GLAND EXCISION Left 12/06/2020   Procedure: EXCISION SUBMANDIBULAR GLAND;  Surgeon: Beverly Gust, MD;  Location: [place];  Service: ENT;  Laterality: Left;  needs to be first case Diabetic - diet controlled    Medical History: Past Medical History:  Diagnosis Date   Anemia    Arthritis    Asthma    Atherosclerosis of native arteries of extremity with intermittent claudication ([place]) 05/26/2016   Cancer ([place]) 2012   Right Lung CA   COPD (chronic obstructive pulmonary disease) ([place])    Depression    Diabetes mellitus without complication ([place])    Patient takes Janumet   Essential hypertension 05/26/2016   Hypercholesteremia    Oxygen dependent    2L at nite    PAD (peripheral artery disease) ([place]) 06/22/2016   Peripheral vascular disease ([place])    Personal history of radiation therapy    Shortness of breath dyspnea    with exertion    Sialolithiasis    Sleep apnea    Wears dentures    full upper and  lower    Family History: Family History  Problem Relation Age of Onset   Lung cancer Father    Diabetes Mother    Hypercholesterolemia Mother    Diabetes Sister    Diabetes Maternal Grandmother    Diabetes Paternal Grandmother    Diabetes Sister    Heart attack Brother    Coronary artery disease Brother    Vascular Disease Brother    Hypertension Sister    Heart attack Brother     Social History   Socioeconomic History   Marital status: Widowed    Spouse name: Not on file   Number of children: Not on file   Years of education: Not on file   Highest education level: Not on file  Occupational History   Not on file  Tobacco Use   Smoking status: Former    Packs/day: 1.00    Years: 37.00    Pack years: 37.00    Types: Cigarettes    Quit date: 02/06/2010    Years since quitting: 11.1   Smokeless tobacco: Former    Types: Snuff  Vaping Use   Vaping Use: Never used  Substance  and Sexual Activity   Alcohol use: Not Currently    Alcohol/week: 5.0 standard drinks    Types: 5 Cans of beer per week    Comment: /h x of alcohol abuse -stopped 2012- now drinks 5 beer per w   Drug use: Not Currently    Types: Marijuana, "Crack" cocaine, Cocaine    Comment: hx of cocaine use- last use 2015; last use sat marijuana6/22/19,    Sexual activity: Yes  Other Topics Concern   Not on file  Social History Narrative   Lives with Significant Other x 43 years   Social Determinants of Health   Financial Resource Strain: Not on file  Food Insecurity: Not on file  Transportation Needs: Not on file  Physical Activity: Not on file  Stress: Not on file  Social Connections: Not on file  Intimate Partner Violence: Not on file      Review of Systems  Constitutional: Negative.  Negative for chills, fatigue, fever and unexpected weight change.  HENT: Negative.  Negative for congestion, sinus pressure, sinus pain and sneezing.   Respiratory:  Positive for cough, shortness of breath and  wheezing (intermittent). Negative for chest tightness.   Cardiovascular:  Negative for chest pain and palpitations.  Gastrointestinal: Negative.   Neurological: Negative.  Negative for dizziness, light-headedness, numbness and headaches.  Psychiatric/Behavioral: Negative.     Vital Signs: BP 139/90 Comment: 146/92  Pulse 99 Comment: 101  Temp 98 F (36.7 C)   Resp 16   Ht 4\' 10"  (1.473 m)   Wt 112 lb 9.6 oz (51.1 kg)   SpO2 97%   BMI 23.53 kg/m    Physical Exam Constitutional:      General: She is not in acute distress.    Appearance: Normal appearance. She is normal weight. She is not ill-appearing.  HENT:     Head: Normocephalic and atraumatic.  Cardiovascular:     Rate and Rhythm: Normal rate and regular rhythm.     Pulses: Normal pulses.     Heart sounds: Normal heart sounds. No murmur heard. Pulmonary:     Effort: Pulmonary effort is normal. No respiratory distress.     Breath sounds: Normal breath sounds. No wheezing.  Skin:    General: Skin is warm and dry.     Capillary Refill: Capillary refill takes less than 2 seconds.  Neurological:     Mental Status: She is alert and oriented to person, place, and time.  Psychiatric:        Mood and Affect: Mood normal.        Behavior: Behavior normal.    Assessment/Plan: 1. Chronic obstructive pulmonary disease, unspecified COPD type ([place]) Advair and spiriva discontinued. Start trelegy ellipta inhaler, discussed how to use inhaler in office, provided 4 weeks of samples and prescription sent in to pharmacy, will follow up with Dr. Devona Konig on 03/26/21 - Fluticasone-Umeclidin-Vilant (TRELEGY ELLIPTA) 100-62.5-25 MCG/INH AEPB; Inhale 1 puff into the lungs daily.  Dispense: 1 each; Refill: 11  2. SOB (shortness of breath) Unable to do spirometry.  - Spirometry with graph   General Counseling: Ama verbalizes understanding of the findings of todays visit and agrees with plan of treatment. I have discussed any further  diagnostic evaluation that may be needed or ordered today. We also reviewed her medications today. she has been encouraged to call the office with any questions or concerns that should arise related to todays visit.    Orders Placed This Encounter  Procedures   Spirometry  with graph    Meds ordered this encounter  Medications   Fluticasone-Umeclidin-Vilant (TRELEGY ELLIPTA) 100-62.5-25 MCG/INH AEPB    Sig: Inhale 1 puff into the lungs daily.    Dispense:  1 each    Refill:  11    Return for F/U, previously scheduled.   Total time spent:20 Minutes Time spent includes review of chart, medications, test results, and follow up plan with the patient.   [place] Controlled Substance Database was reviewed by me.  This patient was seen by Jonetta Osgood, FNP-C in collaboration with Dr. Clayborn Bigness as a part of collaborative care agreement.   Kem Hensen R. Valetta Fuller, MSN, FNP-C Internal medicine

## 2021-03-11 ENCOUNTER — Ambulatory Visit
Admission: RE | Admit: 2021-03-11 | Discharge: 2021-03-11 | Disposition: A | Discharge status: Home / Self Care | Payer: Medicare Other | Source: Ambulatory Visit | Attending: Hematology and Oncology | Admitting: Hematology and Oncology

## 2021-03-11 ENCOUNTER — Other Ambulatory Visit: Payer: Self-pay

## 2021-03-11 DIAGNOSIS — C3411 Malignant neoplasm of upper lobe, right bronchus or lung: Secondary | ICD-10-CM | POA: Diagnosis present

## 2021-03-11 DIAGNOSIS — C3431 Malignant neoplasm of lower lobe, right bronchus or lung: Secondary | ICD-10-CM | POA: Diagnosis present

## 2021-03-11 DIAGNOSIS — D509 Iron deficiency anemia, unspecified: Secondary | ICD-10-CM

## 2021-03-12 ENCOUNTER — Other Ambulatory Visit: Payer: Self-pay

## 2021-03-12 DIAGNOSIS — E1165 Type 2 diabetes mellitus with hyperglycemia: Secondary | ICD-10-CM

## 2021-03-12 MED ORDER — METFORMIN HCL 500 MG PO TABS: 500.0000 mg | ORAL_TABLET | Freq: Every day | ORAL | 3 refills | Status: DC

## 2021-03-13 ENCOUNTER — Other Ambulatory Visit: Payer: Medicare Other

## 2021-03-13 ENCOUNTER — Ambulatory Visit: Payer: Medicare Other | Admitting: Oncology

## 2021-03-20 ENCOUNTER — Inpatient Hospital Stay: Payer: Medicare Other | Attending: Oncology

## 2021-03-20 ENCOUNTER — Encounter: Payer: Self-pay | Admitting: Oncology

## 2021-03-20 ENCOUNTER — Inpatient Hospital Stay (HOSPITAL_BASED_OUTPATIENT_CLINIC_OR_DEPARTMENT_OTHER): Payer: Medicare Other | Admitting: Oncology

## 2021-03-20 ENCOUNTER — Other Ambulatory Visit: Payer: Self-pay

## 2021-03-20 VITALS — BP 133/80 | HR 88 | Temp 98.2°F | Resp 18 | Wt 112.4 lb

## 2021-03-20 DIAGNOSIS — D509 Iron deficiency anemia, unspecified: Secondary | ICD-10-CM | POA: Diagnosis not present

## 2021-03-20 DIAGNOSIS — E119 Type 2 diabetes mellitus without complications: Secondary | ICD-10-CM | POA: Insufficient documentation

## 2021-03-20 DIAGNOSIS — R7401 Elevation of levels of liver transaminase levels: Secondary | ICD-10-CM | POA: Diagnosis not present

## 2021-03-20 DIAGNOSIS — Z87891 Personal history of nicotine dependence: Secondary | ICD-10-CM | POA: Diagnosis not present

## 2021-03-20 DIAGNOSIS — Z801 Family history of malignant neoplasm of trachea, bronchus and lung: Secondary | ICD-10-CM | POA: Diagnosis not present

## 2021-03-20 DIAGNOSIS — C3431 Malignant neoplasm of lower lobe, right bronchus or lung: Secondary | ICD-10-CM | POA: Diagnosis not present

## 2021-03-20 DIAGNOSIS — C3411 Malignant neoplasm of upper lobe, right bronchus or lung: Secondary | ICD-10-CM

## 2021-03-20 DIAGNOSIS — I10 Essential (primary) hypertension: Secondary | ICD-10-CM | POA: Diagnosis not present

## 2021-03-20 LAB — COMPREHENSIVE METABOLIC PANEL
ALT: 53 U/L — ABNORMAL HIGH (ref 0–44)
AST: 44 U/L — ABNORMAL HIGH (ref 15–41)
Albumin: 4.2 g/dL (ref 3.5–5.0)
Alkaline Phosphatase: 73 U/L (ref 38–126)
Anion gap: 9 (ref 5–15)
BUN: 15 mg/dL (ref 8–23)
CO2: 28 mmol/L (ref 22–32)
Calcium: 9.1 mg/dL (ref 8.9–10.3)
Chloride: 97 mmol/L — ABNORMAL LOW (ref 98–111)
Creatinine, Ser: 0.61 mg/dL (ref 0.44–1.00)
GFR, Estimated: >60 mL/min (ref >60)
Glucose, Bld: 242 mg/dL — ABNORMAL HIGH (ref 70–99)
Potassium: 3.5 mmol/L (ref 3.5–5.1)
Sodium: 134 mmol/L — ABNORMAL LOW (ref 135–145)
Total Bilirubin: 0.5 mg/dL (ref 0.3–1.2)
Total Protein: 7.9 g/dL (ref 6.5–8.1)

## 2021-03-20 LAB — CBC WITH DIFFERENTIAL/PLATELET
Abs Immature Granulocytes: 0.01 10*3/uL (ref 0.00–0.07)
Basophils Absolute: 0 10*3/uL (ref 0.0–0.1)
Basophils Relative: 0 %
Eosinophils Absolute: 0.2 10*3/uL (ref 0.0–0.5)
Eosinophils Relative: 4 %
HCT: 36.4 % (ref 36.0–46.0)
Hemoglobin: 11.8 g/dL — ABNORMAL LOW (ref 12.0–15.0)
Immature Granulocytes: 0 %
Lymphocytes Relative: 24 %
Lymphs Abs: 1.1 10*3/uL (ref 0.7–4.0)
MCH: 28.9 pg (ref 26.0–34.0)
MCHC: 32.4 g/dL (ref 30.0–36.0)
MCV: 89.2 fL (ref 80.0–100.0)
Monocytes Absolute: 0.6 10*3/uL (ref 0.1–1.0)
Monocytes Relative: 12 %
Neutro Abs: 2.8 10*3/uL (ref 1.7–7.7)
Neutrophils Relative %: 60 %
Platelets: 295 10*3/uL (ref 150–400)
RBC: 4.08 MIL/uL (ref 3.87–5.11)
RDW: 13.8 % (ref 11.5–15.5)
WBC: 4.7 10*3/uL (ref 4.0–10.5)
nRBC: 0 % (ref 0.0–0.2)

## 2021-03-20 LAB — FERRITIN: Ferritin: 29 ng/mL (ref 11–307)

## 2021-03-20 NOTE — Progress Notes (Signed)
Pt here for follow up. No new concerns voiced.   

## 2021-03-20 NOTE — Progress Notes (Addendum)
[facility]  [address], [address] [place], [place] [address] Phone: [phone]  Fax: [phone]   Clinic Day:  03/20/2021  Referring physician: Lyndon Code, MD  Chief Complaint: Angela Jensen is a 65 y.o. 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   She was not a good surgery candidate secondary to her lung function (FEV1 28%).  CT guided biopsy of the superior and inferior RUL nodules on 12/30/2011 revealed well differentiated adenocarcinoma of lung primary.  EGFR testing revealed no mutation.  She received IMRT 6000 cGy to both nodules from 01/26/2012 - 03/10/2012.   In 04/2015, there was an attempt at radiofrequency ablation of 1 enlarging RUL nodule.  At the time of the procedure, the nodule had shrunk to 5 mm from 9 mm.     # Stage I RLL adenocarcinoma 2019   CT guided RLL biopsy on 02/09/2018 revealed adenocarcinoma of lung origin, lepidic and papillary patterns.   She received 6000 cGy in 5 fractions to RLL nodule from 03/22/2018 - 04/05/2018.   Chest CT on 03/04/2020 revealed enlarging and new pulmonary nodules suspicious for metastatic disease or metachronous primary.  PET scan on 03/19/2020 revealed that the right lower lobe pulmonary nodules did not demonstrate any hypermetabolism to suggest neoplasm. However, these were somewhat worrisome; recommendation was close CT surveillance. There was no mediastinal or hilar lymphadenopathy.  Chest CT  on 09/11/2020 revealed interval treatment of the right lower lobe nodules. These nodules were partly obscured by adjacent parenchymal opacity, although appear decreased in size.  CT guided RLL biopsy on 04/24/2020  revealed adenocarcinoma of the lung with papillary and solid patterns. 05/28/2020 - 06/11/2020  SBRT to the right lung. She received 60 Gy in 5 fractions.  She has an elevated CEA of unclear etiology.  CEA was 8.1 on 01/31/2016, 9.2 on 03/03/2016, 7.9 on 10/12/2017, and 7.9 on 01/19/2019.   Colonoscopy on 06/25/2015 revealed a 4 mm polyp in the sigmoid colon.  Pathology revealed a tubular adenoma without evidence of dysplasia or malignancy.   Pelvic MRI on 04/30/2016 revealed a 3.4 x 2.5 cm ovoid mass in the LEFT adnexa with signal characteristics identical to the intramural leiomyomas and consistent with an exophytic leiomyoma.  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  Endometrium was normal with a normal junctional zone.  Ovaries were not identified.  # iron deficiency anemia.  She required 2 units of PRBCs on 04/04/2020.  Colonoscopy on 06/25/2015 revealed one 4 mm polyp (tubular adenoma) in the sigmoid colon. There were non-bleeding internal hemorrhoids.  Colonoscopy on 07/26/2020 revealed diverticulosis in the entire examined colon. There were non-bleeding internal hemorrhoids. EGD on 07/26/2020 revealed erosive gastropathy with no bleeding and no stigmata of recent bleeding.    INTERVAL HISTORY MAGGY WYBLE is a 65 y.o. female who has above history reviewed by me today presents for follow up visit for iron deficiency anemia, history of lung cancer Patient reports COVID 19 positive few weeks ago.  Symptoms have all resolved. Denies any unintentional weight loss, night sweating, nausea vomiting diarrhea.  Uses oxygen as needed. Past Medical History:  Diagnosis Date   Anemia    Arthritis    Asthma  Atherosclerosis of native arteries of extremity with intermittent claudication ([place]) 05/26/2016   Cancer ([place]) 2012   Right Lung CA   COPD (chronic obstructive pulmonary disease) ([place])    Depression    Diabetes mellitus without complication ([place])    Patient takes Janumet   Essential  hypertension 05/26/2016   Hypercholesteremia    Oxygen dependent    2L at nite    PAD (peripheral artery disease) ([place]) 06/22/2016   Peripheral vascular disease ([facility])    Personal history of radiation therapy    Shortness of breath dyspnea    with exertion    Sialolithiasis    Sleep apnea    Wears dentures    full upper and lower    Past Surgical History:  Procedure Laterality Date   CESAREAN SECTION     x3   COLONOSCOPY WITH PROPOFOL N/A 06/25/2015   Procedure: COLONOSCOPY WITH PROPOFOL;  Surgeon: Lucilla Lame, MD;  Location: [facility];  Service: Endoscopy;  Laterality: N/A;   COLONOSCOPY WITH PROPOFOL N/A 07/26/2020   Procedure: COLONOSCOPY WITH PROPOFOL;  Surgeon: Lucilla Lame, MD;  Location: [facility];  Service: Endoscopy;  Laterality: N/A;   CYST REMOVAL LEG     and on shoulder    ESOPHAGOGASTRODUODENOSCOPY (EGD) WITH PROPOFOL N/A 07/26/2020   Procedure: ESOPHAGOGASTRODUODENOSCOPY (EGD) WITH PROPOFOL;  Surgeon: Lucilla Lame, MD;  Location: [facility];  Service: Endoscopy;  Laterality: N/A;  Diabetic - oral meds   LOWER EXTREMITY ANGIOGRAPHY Left 09/29/2018   Procedure: LOWER EXTREMITY ANGIOGRAPHY;  Surgeon: Algernon Huxley, MD;  Location: [facility];  Service: Cardiovascular;  Laterality: Left;   LUNG BIOPSY  05 15 2013   has lung "spots"   PERIPHERAL VASCULAR CATHETERIZATION Left 06/01/2016   Procedure: Lower Extremity Angiography;  Surgeon: Algernon Huxley, MD;  Location: [facility];  Service: Cardiovascular;  Laterality: Left;   PERIPHERAL VASCULAR CATHETERIZATION N/A 06/01/2016   Procedure: Abdominal Aortogram w/Lower Extremity;  Surgeon: Algernon Huxley, MD;  Location: [facility];  Service: Cardiovascular;  Laterality: N/A;   PERIPHERAL VASCULAR CATHETERIZATION  06/01/2016   Procedure: Lower Extremity Intervention;  Surgeon: Algernon Huxley, MD;  Location: [facility];  Service: Cardiovascular;;   PERIPHERAL VASCULAR  CATHETERIZATION Right 06/08/2016   Procedure: Lower Extremity Angiography;  Surgeon: Algernon Huxley, MD;  Location: [facility];  Service: Cardiovascular;  Laterality: Right;   PERIPHERAL VASCULAR CATHETERIZATION  06/08/2016   Procedure: Lower Extremity Intervention;  Surgeon: Algernon Huxley, MD;  Location: [facility];  Service: Cardiovascular;;   SUBMANDIBULAR GLAND EXCISION Left 12/06/2020   Procedure: EXCISION SUBMANDIBULAR GLAND;  Surgeon: Beverly Gust, MD;  Location: [facility];  Service: ENT;  Laterality: Left;  needs to be first case Diabetic - diet controlled    Family History  Problem Relation Age of Onset   Lung cancer Father    Diabetes Mother    Hypercholesterolemia Mother    Diabetes Sister    Diabetes Maternal Grandmother    Diabetes Paternal Grandmother    Diabetes Sister    Heart attack Brother    Coronary artery disease Brother    Vascular Disease Brother    Hypertension Sister    Heart attack Brother     Social History:  reports that she quit smoking about 11 years ago. Her smoking use included cigarettes. She has a 37.00 pack-year smoking history. She has quit using smokeless tobacco.  Her smokeless tobacco use included  snuff. She reports previous alcohol use of about 5.0 standard drinks of alcohol per week. She reports previous drug use. Drugs: Marijuana, "Crack" cocaine, and Cocaine. She started smoking at 17 until she was "50 something". Her boyfriend is Diana Eves: [phone]. She lives in [place]. The patient is alone today.  Allergies: No Known Allergies  Current Medications: Current Outpatient Medications  Medication Sig Dispense Refill   Accu-Chek FastClix Lancets MISC Use as directed twice a day diag E11.65 100 each 1   albuterol (PROVENTIL) (2.5 MG/3ML) 0.083% nebulizer solution Take 3 mLs (2.5 mg total) by nebulization every 6 (six) hours as needed for wheezing. 75 mL 3   albuterol (VENTOLIN HFA) 108 (90 Base) MCG/ACT  inhaler Inhale 2 puffs into the lungs every 6 (six) hours as needed for wheezing or shortness of breath. 8 g 3   BINAXNOW COVID-19 AG HOME TEST KIT Use as Directed on the Package     clopidogrel (PLAVIX) 75 MG tablet Take 1 tablet (75 mg total) by mouth daily. 90 tablet 1   ferrous sulfate 324 MG TBEC Take 324 mg by mouth.     Fluticasone-Umeclidin-Vilant (TRELEGY ELLIPTA) 100-62.5-25 MCG/INH AEPB Inhale 1 puff into the lungs daily. 1 each 11   gabapentin (NEURONTIN) 100 MG capsule Take 1 capsule (100 mg total) by mouth 2 (two) times daily. 90 capsule 3   glucose blood (ACCU-CHEK AVIVA PLUS) test strip Use as instructed to check blood sugars three times daily.  E11.65 (Patient taking differently: Use as instructed to check blood sugars three times daily.  E11.65) 250 each 5   lisinopril-hydrochlorothiazide (ZESTORETIC) 10-12.5 MG tablet Take 1 tablet by mouth daily. 90 tablet 3   meloxicam (MOBIC) 7.5 MG tablet Take 7.5 mg by mouth 2 (two) times daily.     metFORMIN (GLUCOPHAGE) 500 MG tablet Take 1 tablet (500 mg total) by mouth daily with breakfast. 90 tablet 3   potassium chloride (KLOR-CON) 10 MEQ tablet Take one tab po qod 45 tablet 1   No current facility-administered medications for this visit.    Review of Systems  Constitutional:  Negative for chills, diaphoresis, fever, malaise/fatigue and weight loss.  HENT:  Negative for congestion, ear discharge, ear pain, hearing loss, nosebleeds, sinus pain, sore throat and tinnitus.   Eyes: Negative.  Negative for blurred vision.  Respiratory:  Negative for cough, hemoptysis, sputum production and shortness of breath.        Uses oxygen at night.  Cardiovascular:  Negative for chest pain, palpitations and leg swelling.  Gastrointestinal: Negative.  Negative for abdominal pain, blood in stool, constipation, diarrhea, heartburn, melena, nausea and vomiting.  Genitourinary: Negative.  Negative for dysuria, frequency, hematuria and urgency.   Musculoskeletal:  Negative for back pain, joint pain, myalgias and neck pain.  Skin: Negative.  Negative for itching and rash.  Neurological: Negative.  Negative for dizziness, tingling, sensory change, speech change, focal weakness, weakness and headaches.  Endo/Heme/Allergies: Negative.  Does not bruise/bleed easily.       Diabetes, under good control  Psychiatric/Behavioral: Negative.  Negative for depression and memory loss. The patient is not nervous/anxious and does not have insomnia.   All other systems reviewed and are negative.  Performance status (ECOG): 0-1  Vitals:  Blood pressure 133/80, pulse 88, temperature 98.2 F (36.8 C), resp. rate 18, weight 112 lb 7 oz (51 kg).  Physical Exam Vitals and nursing note reviewed.  Constitutional:      General: She is not in acute distress.  Appearance: She is not diaphoretic.  HENT:     Head: Atraumatic.     Comments: Dark hair.    Mouth/Throat:     Mouth: Mucous membranes are moist.     Pharynx: Oropharynx is clear.  Eyes:     General: No scleral icterus.    Conjunctiva/sclera: Conjunctivae normal.  Cardiovascular:     Rate and Rhythm: Normal rate and regular rhythm.     Pulses: Normal pulses.     Heart sounds: Normal heart sounds.  Pulmonary:     Effort: Pulmonary effort is normal. No respiratory distress.     Breath sounds: No wheezing or rales.     Comments: Decreased breath sound bilaterally Chest:  Breasts:    Right: No axillary adenopathy or supraclavicular adenopathy.     Left: No axillary adenopathy or supraclavicular adenopathy.  Abdominal:     General: There is no distension.     Palpations: Abdomen is soft. There is no hepatomegaly or splenomegaly.     Tenderness: There is no abdominal tenderness.  Musculoskeletal:        General: No swelling.  Lymphadenopathy:     Head:     Right side of head: No preauricular, posterior auricular or occipital adenopathy.     Left side of head: No preauricular,  posterior auricular or occipital adenopathy.     Cervical: No cervical adenopathy.     Upper Body:     Right upper body: No supraclavicular or axillary adenopathy.     Left upper body: No supraclavicular or axillary adenopathy.     Lower Body: No right inguinal adenopathy. No left inguinal adenopathy.  Skin:    General: Skin is warm and dry.  Neurological:     Mental Status: She is alert and oriented to person, place, and time.  Psychiatric:        Mood and Affect: Mood normal.   RADIOGRAPHIC STUDIES: I have personally reviewed the radiological images as listed and agreed with the findings in the report. DG Ribs Unilateral W/Chest Right  Result Date: 01/28/2021 CLINICAL DATA:  Pain EXAM: RIGHT RIBS AND CHEST - 3+ VIEW COMPARISON:  Chest CT 09/11/2020, radiograph 05/05/2020 FINDINGS: Unchanged cardiomediastinal silhouette. Stable post treatment changes in the right apex, and scarring/opacities along the right minor fissure. There are increased opacities in the periphery of the right lower lobe, which has a correlate on recent chest CT in January. The left lung is clear. Unchanged right lateral seventh rib fracture. There is no new visible displaced rib fracture IMPRESSION: Unchanged chronic right lateral seventh rib fracture. No evidence of new displaced rib fracture. Chronic post treatment changes in the right apex and along the minor fissure. Increased opacities in the peripheral right lower lobe which may be evolving post treatment changes/scarring, though recurrence/progression of disease cannot be excluded. Electronically Signed   By: Maurine Simmering   On: 01/28/2021 08:04   CT CHEST WO CONTRAST  Result Date: 03/12/2021 CLINICAL DATA:  Non-small cell lung cancer surveillance, right upper lobe diagnosed 2012, status post SBRT, right lower lobe diagnosed 2021, status post SBRT EXAM: CT CHEST WITHOUT CONTRAST TECHNIQUE: Multidetector CT imaging of the chest was performed following the standard  protocol without IV contrast. COMPARISON:  None. FINDINGS: Cardiovascular: Aortic atherosclerosis. Normal heart size. Left coronary artery calcifications. No pericardial effusion. Mediastinum/Nodes: No enlarged mediastinal, hilar, or axillary lymph nodes. Thyroid gland, trachea, and esophagus demonstrate no significant findings. Lungs/Pleura: Severe centrilobular emphysema. Unchanged, post radiation appearance of the apical right upper  lobe (series 3, image 24). Unchanged, bandlike post radiation fibrosis and consolidation of the lateral segment right middle lobe and right lower lobe (series 3, image 73, 94). There is a new 7 mm nodule of the peripheral left upper lobe (series 3, image 47). No pleural effusion or pneumothorax. Upper Abdomen: No acute abnormality. Musculoskeletal: No chest wall mass or suspicious bone lesions identified. Redemonstrated, chronic fracture of the lateral right seventh rib (series 3, image 69). IMPRESSION: 1. Unchanged, post radiation appearance of the apical right upper lobe. 2. Unchanged, bandlike post radiation fibrosis and consolidation of the lateral segment right middle lobe and right lower lobe. 3. There is a new 7 mm nodule of the peripheral left upper lobe, concerning for metachronous malignancy although nonspecific and possibly infectious or inflammatory. Recommend follow-up CT in 3 months to assess for stability or resolution. This nodule is likely below the size threshold for accurate FDG PET characterization at this time. 4. Emphysema. 5. Coronary artery disease. Aortic Atherosclerosis (ICD10-I70.0) and Emphysema (ICD10-J43.9). Electronically Signed   By: Eddie Candle M.D.   On: 03/12/2021 11:33     Assessment and Plan: 1. Iron deficiency anemia, unspecified iron deficiency anemia type   2. Cancer of lower lobe of right lung ([place])   3. Malignant neoplasm of upper lobe of right lung ([place])   4. Transaminitis     #History of Stage I RUL adenocarcinoma-status post IMRT  completed 03/10/2012. 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 -Recurrence-SBRT completed 06/11/2020. Foundation one NGS showed TMB >10 muts/mb, MS stable, KRAS G12V, MYC amplification,STK11 [id number], FUBP1 splice sit 341+[id number]>X  04/18/4096, CT chest without contrast showed unchanged right upper lobe postradiation appearance. Unchanged bandlike fibrosis and consolidation of the lateral segment right middle lobe and right lower lobe. New 7 mm nodule of the peripheral left upper lobe-concerning for metachronous malignancy though nonspecific.  Recommend repeat CT in 3 months CT scan was reviewed and discussed with patient.  # Iron deficiency anemia Labs reviewed and discussed with patient.  Hemoglobin 11.8, ferritin 29. Recommend patient to continue oral iron supplementation twice daily.  #Transaminitis, this is new for her.  ?  Recent COVID 19 infection.  Repeat LFT in 3 months.  RTC after chest CT for MD assessment, labs (CBC with diff, CMP, ferritin), and review of chest CT.  I discussed the assessment and treatment plan with the patient.  The patient was provided an opportunity to ask questions and all were answered.  The patient agreed with the plan and demonstrated an understanding of the instructions.  The patient was advised to call back or seek an in person evaluation if the symptoms worsen or if the condition fails to improve as anticipated.  We spent sufficient time to discuss many aspect of care, questions were answered to patient's satisfaction. A total of 40 minutes was spent on this visit.  With 15 minutes spent reviewing image findings, pathology reports, 20 minutes counseling the patient on the diagnosis, goal of care, surveillance plan.  Additional 5 minutes was spent on answering patient's questions.   Earlie Server, MD, PhD Hematology Oncology [place] at [facility] 03/20/2021

## 2021-03-24 ENCOUNTER — Telehealth: Payer: Self-pay

## 2021-03-24 NOTE — Telephone Encounter (Signed)
Left vm to confirm 03/26/21 appointment-Toni

## 2021-03-26 ENCOUNTER — Ambulatory Visit: Payer: Medicare Other | Admitting: Internal Medicine

## 2021-03-31 ENCOUNTER — Emergency Department: Payer: Medicare Other

## 2021-03-31 ENCOUNTER — Other Ambulatory Visit: Payer: Self-pay

## 2021-03-31 ENCOUNTER — Inpatient Hospital Stay
Admission: EM | Admit: 2021-03-31 | Discharge: 2021-04-05 | DRG: 190 | Disposition: A | Discharge status: Home / Self Care | Payer: Medicare Other | Attending: Hospitalist | Admitting: Hospitalist

## 2021-03-31 DIAGNOSIS — Z7984 Long term (current) use of oral hypoglycemic drugs: Secondary | ICD-10-CM | POA: Diagnosis not present

## 2021-03-31 DIAGNOSIS — R7989 Other specified abnormal findings of blood chemistry: Secondary | ICD-10-CM

## 2021-03-31 DIAGNOSIS — Z83438 Family history of other disorder of lipoprotein metabolism and other lipidemia: Secondary | ICD-10-CM

## 2021-03-31 DIAGNOSIS — E1151 Type 2 diabetes mellitus with diabetic peripheral angiopathy without gangrene: Secondary | ICD-10-CM | POA: Diagnosis present

## 2021-03-31 DIAGNOSIS — D638 Anemia in other chronic diseases classified elsewhere: Secondary | ICD-10-CM | POA: Diagnosis present

## 2021-03-31 DIAGNOSIS — Z923 Personal history of irradiation: Secondary | ICD-10-CM | POA: Diagnosis not present

## 2021-03-31 DIAGNOSIS — Z7951 Long term (current) use of inhaled steroids: Secondary | ICD-10-CM | POA: Diagnosis not present

## 2021-03-31 DIAGNOSIS — Z8249 Family history of ischemic heart disease and other diseases of the circulatory system: Secondary | ICD-10-CM

## 2021-03-31 DIAGNOSIS — Z791 Long term (current) use of non-steroidal anti-inflammatories (NSAID): Secondary | ICD-10-CM | POA: Diagnosis not present

## 2021-03-31 DIAGNOSIS — Z20822 Contact with and (suspected) exposure to covid-19: Secondary | ICD-10-CM | POA: Diagnosis present

## 2021-03-31 DIAGNOSIS — E1142 Type 2 diabetes mellitus with diabetic polyneuropathy: Secondary | ICD-10-CM | POA: Diagnosis present

## 2021-03-31 DIAGNOSIS — M549 Dorsalgia, unspecified: Secondary | ICD-10-CM | POA: Diagnosis present

## 2021-03-31 DIAGNOSIS — Z8616 Personal history of COVID-19: Secondary | ICD-10-CM | POA: Diagnosis not present

## 2021-03-31 DIAGNOSIS — I447 Left bundle-branch block, unspecified: Secondary | ICD-10-CM | POA: Diagnosis present

## 2021-03-31 DIAGNOSIS — T380X5A Adverse effect of glucocorticoids and synthetic analogues, initial encounter: Secondary | ICD-10-CM | POA: Diagnosis not present

## 2021-03-31 DIAGNOSIS — D649 Anemia, unspecified: Secondary | ICD-10-CM | POA: Diagnosis present

## 2021-03-31 DIAGNOSIS — Z79899 Other long term (current) drug therapy: Secondary | ICD-10-CM | POA: Diagnosis not present

## 2021-03-31 DIAGNOSIS — Z9981 Dependence on supplemental oxygen: Secondary | ICD-10-CM

## 2021-03-31 DIAGNOSIS — D509 Iron deficiency anemia, unspecified: Secondary | ICD-10-CM | POA: Diagnosis present

## 2021-03-31 DIAGNOSIS — C3411 Malignant neoplasm of upper lobe, right bronchus or lung: Secondary | ICD-10-CM | POA: Diagnosis present

## 2021-03-31 DIAGNOSIS — J441 Chronic obstructive pulmonary disease with (acute) exacerbation: Principal | ICD-10-CM | POA: Diagnosis present

## 2021-03-31 DIAGNOSIS — E7800 Pure hypercholesterolemia, unspecified: Secondary | ICD-10-CM | POA: Diagnosis present

## 2021-03-31 DIAGNOSIS — Z85118 Personal history of other malignant neoplasm of bronchus and lung: Secondary | ICD-10-CM

## 2021-03-31 DIAGNOSIS — Z7902 Long term (current) use of antithrombotics/antiplatelets: Secondary | ICD-10-CM

## 2021-03-31 DIAGNOSIS — J9621 Acute and chronic respiratory failure with hypoxia: Secondary | ICD-10-CM | POA: Diagnosis present

## 2021-03-31 DIAGNOSIS — I10 Essential (primary) hypertension: Secondary | ICD-10-CM | POA: Diagnosis present

## 2021-03-31 DIAGNOSIS — Z801 Family history of malignant neoplasm of trachea, bronchus and lung: Secondary | ICD-10-CM

## 2021-03-31 DIAGNOSIS — R778 Other specified abnormalities of plasma proteins: Secondary | ICD-10-CM | POA: Diagnosis not present

## 2021-03-31 DIAGNOSIS — Z87891 Personal history of nicotine dependence: Secondary | ICD-10-CM

## 2021-03-31 DIAGNOSIS — E1165 Type 2 diabetes mellitus with hyperglycemia: Secondary | ICD-10-CM | POA: Diagnosis not present

## 2021-03-31 DIAGNOSIS — J9601 Acute respiratory failure with hypoxia: Secondary | ICD-10-CM

## 2021-03-31 DIAGNOSIS — I739 Peripheral vascular disease, unspecified: Secondary | ICD-10-CM

## 2021-03-31 DIAGNOSIS — E119 Type 2 diabetes mellitus without complications: Secondary | ICD-10-CM

## 2021-03-31 DIAGNOSIS — G4730 Sleep apnea, unspecified: Secondary | ICD-10-CM | POA: Diagnosis present

## 2021-03-31 DIAGNOSIS — R0609 Other forms of dyspnea: Secondary | ICD-10-CM | POA: Diagnosis not present

## 2021-03-31 DIAGNOSIS — Z833 Family history of diabetes mellitus: Secondary | ICD-10-CM

## 2021-03-31 DIAGNOSIS — R0602 Shortness of breath: Secondary | ICD-10-CM | POA: Diagnosis present

## 2021-03-31 LAB — BASIC METABOLIC PANEL
Anion gap: 7 (ref 5–15)
BUN: 12 mg/dL (ref 8–23)
CO2: 29 mmol/L (ref 22–32)
Calcium: 9 mg/dL (ref 8.9–10.3)
Chloride: 100 mmol/L (ref 98–111)
Creatinine, Ser: 0.57 mg/dL (ref 0.44–1.00)
GFR, Estimated: >60 mL/min (ref >60)
Glucose, Bld: 236 mg/dL — ABNORMAL HIGH (ref 70–99)
Potassium: 3.8 mmol/L (ref 3.5–5.1)
Sodium: 136 mmol/L (ref 135–145)

## 2021-03-31 LAB — CBC
HCT: 33.5 % — ABNORMAL LOW (ref 36.0–46.0)
Hemoglobin: 10.6 g/dL — ABNORMAL LOW (ref 12.0–15.0)
MCH: 29.8 pg (ref 26.0–34.0)
MCHC: 31.6 g/dL (ref 30.0–36.0)
MCV: 94.1 fL (ref 80.0–100.0)
Platelets: 239 10*3/uL (ref 150–400)
RBC: 3.56 MIL/uL — ABNORMAL LOW (ref 3.87–5.11)
RDW: 13.6 % (ref 11.5–15.5)
WBC: 4.5 10*3/uL (ref 4.0–10.5)
nRBC: 0 % (ref 0.0–0.2)

## 2021-03-31 LAB — TROPONIN I (HIGH SENSITIVITY)
Troponin I (High Sensitivity): 21 ng/L — ABNORMAL HIGH (ref <18)
Troponin I (High Sensitivity): 32 ng/L — ABNORMAL HIGH (ref <18)
Troponin I (High Sensitivity): 30 ng/L — ABNORMAL HIGH (ref <18)

## 2021-03-31 LAB — CBG MONITORING, ED: Glucose-Capillary: 98 mg/dL (ref 70–99)

## 2021-03-31 LAB — RESP PANEL BY RT-PCR (FLU A&B, COVID) ARPGX2
Influenza A by PCR: NEGATIVE
Influenza B by PCR: NEGATIVE
SARS Coronavirus 2 by RT PCR: NEGATIVE

## 2021-03-31 LAB — D-DIMER, QUANTITATIVE: D-Dimer, Quant: 1.63 ug/mL-FEU — ABNORMAL HIGH (ref 0.00–0.50)

## 2021-03-31 MED ORDER — IPRATROPIUM-ALBUTEROL 0.5-2.5 (3) MG/3ML IN SOLN
3.0000 mL | Freq: Once | RESPIRATORY_TRACT | Status: AC
  Administered 2021-03-31: 3 mL via RESPIRATORY_TRACT
  Filled 2021-03-31: qty 3

## 2021-03-31 MED ORDER — ENOXAPARIN SODIUM 40 MG/0.4ML IJ SOSY
40.0000 mg | PREFILLED_SYRINGE | Freq: Every day | INTRAMUSCULAR | Status: DC
  Administered 2021-04-01 – 2021-04-04 (×5): 40 mg via SUBCUTANEOUS
  Filled 2021-03-31 (×5): qty 0.4

## 2021-03-31 MED ORDER — IOHEXOL 350 MG/ML SOLN
75.0000 mL | Freq: Once | INTRAVENOUS | Status: AC | PRN
  Administered 2021-03-31: 75 mL via INTRAVENOUS

## 2021-03-31 MED ORDER — METHYLPREDNISOLONE SODIUM SUCC 40 MG IJ SOLR
40.0000 mg | Freq: Every day | INTRAMUSCULAR | Status: DC
  Administered 2021-04-01 – 2021-04-02 (×2): 40 mg via INTRAVENOUS
  Filled 2021-03-31 (×2): qty 1

## 2021-03-31 MED ORDER — METHYLPREDNISOLONE SODIUM SUCC 125 MG IJ SOLR
125.0000 mg | Freq: Once | INTRAMUSCULAR | Status: AC
  Administered 2021-03-31: 125 mg via INTRAVENOUS
  Filled 2021-03-31: qty 2

## 2021-03-31 MED ORDER — INSULIN ASPART 100 UNIT/ML IJ SOLN
0.0000 [IU] | Freq: Three times a day (TID) | INTRAMUSCULAR | Status: DC
  Administered 2021-04-01: 9 [IU] via SUBCUTANEOUS
  Filled 2021-03-31: qty 1

## 2021-03-31 MED ORDER — IPRATROPIUM-ALBUTEROL 0.5-2.5 (3) MG/3ML IN SOLN
3.0000 mL | Freq: Four times a day (QID) | RESPIRATORY_TRACT | Status: DC
  Administered 2021-04-01 (×3): 3 mL via RESPIRATORY_TRACT
  Filled 2021-03-31 (×3): qty 3

## 2021-03-31 NOTE — ED Triage Notes (Signed)
Pt to ED [facility] from home for shob since having COVID 3 weeks ago but states shob has worsened over last week.  Has been wearing O2 at night only, placed on 2 L [place] by EMS, 100% on 2 L [place] NAD noted, speaking in complete sentences, RR even and unlabored  Sig pad not working, pt verbalizes understanding of MSE waiver

## 2021-03-31 NOTE — H&P (Signed)
History and Physical    Angela Jensen:[id number] DOB: 21-Aug-1955 DOA: 03/31/2021  PCP: [name], MD  Patient coming from: Home  I have personally briefly reviewed patient's old medical records in [place]  Chief Complaint: Increasing shortness of breath  HPI: Angela Jensen is a 65 y.o. 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  She reports this has been ongoing for about 2 weeks since getting COVID.  This was confirmed by a home test and she remained in quarantine at home.  Did not seek any medical attention for it.  States about a week later she was placed on Trelegy by her PCP and also developed more wheezing.  Shortness of breath now progressively worse and she has a hard time even taking a shower.  At baseline she is on 2 L at night but now use it throughout the day for the past 2 weeks.  She denies any current chest pain but was recently evaluated at [facility] ED for right-sided chest pain that was thought due to her chronic right rib fracture aggravated by COVID-19 infection.  Pulmonary embolism ruled out with CTA.  EKG reported to show sinus tachycardia with LBBB, prolonged QT interval and no ST elevation.  Troponin of 9 at that time. She denies any lower extremity edema.  No nausea, vomiting.  Had a few episodes of diarrhea yesterday but no abdominal pain.  Denies any current tobacco use.  Denies alcohol or illicit drug use.  In the ED, she was afebrile, mildly tachycardic and normotensive.  She had oxygen desaturation down to 70% while ambulating to the commode without her oxygen but improved immediately back on her 2 L.  Had elevated D-dimer of 1.63. CTA chest was negative  for PE and showed unchanged 7 mm left upper lobe nodule seen in recent prior imaging at [facility].  No WBC, hemoglobin of 10.6 around her baseline.  BG 263 with no other electrolyte abnormalities.  Troponin of 21 and 32.  EKG with slight ST depression of V5, V6 not seen in prior recent EKG.  She was given IV Solu-Medrol, DuoNeb nebulizer in the ED and hospitalist was called for admission.  Review of Systems: Constitutional: No Weight Change, No Fever ENT/Mouth: No sore throat, No Rhinorrhea Eyes: No Eye Pain, No Vision Changes Cardiovascular: No Chest Pain, +SOB, No PND, + Dyspnea on Exertion, No Orthopnea,  No Edema, No Palpitations Respiratory: No Cough, No Sputum, + Wheezing, + Dyspnea  Gastrointestinal: No Nausea, No Vomiting, + Diarrhea, No Constipation, No Pain Genitourinary: no Urinary Incontinence Musculoskeletal: No Arthralgias, No Myalgias Skin: No Skin Lesions, No Pruritus, Neuro: no Weakness, No Numbness Psych: No Anxiety/Panic, No Depression, no decrease appetite Heme/Lymph: No Bruising, No Bleeding  Past Medical History:  Diagnosis Date   Anemia    Arthritis    Asthma    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/2016   Cancer ([place]) 2012   Right Lung CA   COPD (chronic obstructive pulmonary disease) (HCC)    Depression    Diabetes mellitus without complication ([place])    Patient takes Janumet   Essential hypertension 05/26/2016   Hypercholesteremia    Oxygen dependent    2L  at nite    PAD (peripheral artery disease) ([place]) 06/22/2016   Peripheral vascular disease (HCC)    Personal history of radiation therapy    Shortness of breath dyspnea    with exertion    Sialolithiasis    Sleep apnea    Wears dentures    full upper and lower    Past Surgical History:  Procedure Laterality Date   CESAREAN SECTION     x3   COLONOSCOPY WITH PROPOFOL N/A 06/25/2015   Procedure: COLONOSCOPY WITH PROPOFOL;  Surgeon: Lucilla Lame,  MD;  Location: [facility] END[facility]COPY;  Service: Endoscopy;  Laterality: N/A;   COLONOSCOPY WITH PROPOFOL N/A 07/26/2020   Procedure: COLONOSCOPY WITH PROPOFOL;  Surgeon: Lucilla Lame, MD;  Location: [place];  Service: Endoscopy;  Laterality: N/A;   CYST REMOVAL LEG     and on shoulder    ESOPHAGOGASTRODUODENOSCOPY (EGD) WITH PROPOFOL N/A 07/26/2020   Procedure: ESOPHAGOGASTRODUODENOSCOPY (EGD) WITH PROPOFOL;  Surgeon: Lucilla Lame, MD;  Location: [place];  Service: Endoscopy;  Laterality: N/A;  Diabetic - oral meds   LOWER EXTREMITY ANGIOGRAPHY Left 09/29/2018   Procedure: LOWER EXTREMITY ANGIOGRAPHY;  Surgeon: Algernon Huxley, MD;  Location: [place] [facility];  Service: Cardiovascular;  Laterality: Left;   LUNG BIOPSY  05 15 2013   has lung "spots"   PERIPHERAL VASCULAR CATHETERIZATION Left 06/01/2016   Procedure: Lower Extremity Angiography;  Surgeon: Algernon Huxley, MD;  Location: [place] [facility];  Service: Cardiovascular;  Laterality: Left;   PERIPHERAL VASCULAR CATHETERIZATION N/A 06/01/2016   Procedure: Abdominal Aortogram w/Lower Extremity;  Surgeon: Algernon Huxley, MD;  Location: [place] [facility];  Service: Cardiovascular;  Laterality: N/A;   PERIPHERAL VASCULAR CATHETERIZATION  06/01/2016   Procedure: Lower Extremity Intervention;  Surgeon: Algernon Huxley, MD;  Location: [place] [facility];  Service: Cardiovascular;;   PERIPHERAL VASCULAR CATHETERIZATION Right 06/08/2016   Procedure: Lower Extremity Angiography;  Surgeon: Algernon Huxley, MD;  Location: [place] [facility];  Service: Cardiovascular;  Laterality: Right;   PERIPHERAL VASCULAR CATHETERIZATION  06/08/2016   Procedure: Lower Extremity Intervention;  Surgeon: Algernon Huxley, MD;  Location: [place] [facility];  Service: Cardiovascular;;   SUBMANDIBULAR GLAND EXCISION Left 12/06/2020   Procedure: EXCISION SUBMANDIBULAR GLAND;  Surgeon: Beverly Gust, MD;  Location: [place];  Service: ENT;   Laterality: Left;  needs to be first case Diabetic - diet controlled     reports that she quit smoking about 11 years ago. Her smoking use included cigarettes. She has a 37.00 pack-year smoking history. She has quit using smokeless tobacco.  Her smokeless tobacco use included snuff. She reports that she does not currently use alcohol after a past usage of about 5.0 standard drinks per week. She reports that she does not currently use drugs after having used the following drugs: Marijuana, "Crack" cocaine, and Cocaine. Social History  No Known Allergies  Family History  Problem Relation Age of Onset   Lung cancer Father    Diabetes Mother    Hypercholesterolemia Mother    Diabetes Sister    Diabetes Maternal Grandmother    Diabetes Paternal Grandmother    Diabetes Sister    Heart attack Brother    Coronary artery disease Brother    Vascular Disease Brother    Hypertension Sister    Heart attack Brother      Prior to Admission medications   Medication Sig Start Date End Date Taking? Authorizing Provider  Accu-Chek  FastClix Lancets MISC Use as directed twice a day diag E11.65 03/15/19  Yes Boscia, Heather E, NP  albuterol (PROVENTIL) (2.5 MG/3ML) 0.083% nebulizer solution Take 3 mLs (2.5 mg total) by nebulization every 6 (six) hours as needed for wheezing. 12/11/20  Yes [name], MD  albuterol (VENTOLIN HFA) 108 (90 Base) MCG/ACT inhaler Inhale 2 puffs into the lungs every 6 (six) hours as needed for wheezing or shortness of breath. 12/11/20  Yes [name], MD  clopidogrel (PLAVIX) 75 MG tablet Take 1 tablet (75 mg total) by mouth daily. 12/24/20  Yes [name], MD  ferrous sulfate 324 MG TBEC Take 324 mg by mouth.   Yes [provider]  Fluticasone-Umeclidin-Vilant (TRELEGY ELLIPTA) 100-62.5-25 MCG/INH AEPB Inhale 1 puff into the lungs daily. 03/10/21  Yes Abernathy, Yetta Flock, NP  gabapentin (NEURONTIN) 100 MG capsule Take 1 capsule (100 mg total) by mouth 2 (two) times  daily. 11/04/20  Yes McDonough, Lauren K, PA-C  glucose blood (ACCU-CHEK AVIVA PLUS) test strip Use as instructed to check blood sugars three times daily.  E11.65 Patient taking differently: Use as instructed to check blood sugars three times daily.  E11.65 03/16/19  Yes Boscia, Greer Ee, NP  lisinopril-hydrochlorothiazide (ZESTORETIC) 10-12.5 MG tablet Take 1 tablet by mouth daily. 09/20/20  Yes [name], MD  meloxicam (MOBIC) 7.5 MG tablet Take 7.5 mg by mouth 2 (two) times daily. 03/10/21  Yes [provider]  metFORMIN (GLUCOPHAGE) 500 MG tablet Take 1 tablet (500 mg total) by mouth daily with breakfast. 03/12/21  Yes [name], MD  potassium chloride (KLOR-CON) 10 MEQ tablet Take one tab po qod 09/20/20  Yes [name], MD  Bartolo Darter COVID-19 AG HOME TEST KIT Use as Directed on the Package Patient not taking: Reported on 03/31/2021 03/18/21   [provider]    Physical Exam: Vitals:   03/31/21 2010 03/31/21 2115 03/31/21 2239 03/31/21 2300  BP: (!) 159/109  (!) 156/105 (!) 111/92  Pulse: 99 (!) 102 99 (!) 103  Resp: 18 (!) 25 (!) 24 19  Temp: 98.1 F (36.7 C)     TempSrc: Oral     SpO2: 99% 99% 100% 100%  Weight:      Height:        Constitutional: NAD, calm, comfortable, elderly female sitting upright in bed Vitals:   03/31/21 2010 03/31/21 2115 03/31/21 2239 03/31/21 2300  BP: (!) 159/109  (!) 156/105 (!) 111/92  Pulse: 99 (!) 102 99 (!) 103  Resp: 18 (!) 25 (!) 24 19  Temp: 98.1 F (36.7 C)     TempSrc: Oral     SpO2: 99% 99% 100% 100%  Weight:      Height:       Eyes: PERRL, lids and conjunctivae normal ENMT: Mucous membranes are moist.  Neck: normal, supple, Respiratory: Diminished breath sounds throughout but no wheezing, no crackles. Normal respiratory effort on 2 L. No accessory muscle use.  Cardiovascular: Regular rate and rhythm, no murmurs / rubs / gallops. No extremity edema. 2+ pedal pulses.  Abdomen: no tenderness, no masses palpated.   Bowel sounds positive.  Musculoskeletal: no clubbing / cyanosis. No joint deformity upper and lower extremities. Good ROM, no contractures. Normal muscle tone.  Skin: no rashes, lesions, ulcers. No induration Neurologic: CN 2-12 grossly intact. Sensation intact, . Strength 5/5 in all 4.  Psychiatric: Normal judgment and insight. Alert and oriented x 3. Normal cheerful mood.  Labs on Admission: I have personally reviewed following labs and imaging studies  CBC: Recent Labs  Lab 03/31/21 1444  WBC 4.5  HGB 10.6*  HCT 33.5*  MCV 94.1  PLT 660   Basic Metabolic Panel: Recent Labs  Lab 03/31/21 1444  NA 136  K 3.8  CL 100  CO2 29  GLUCOSE 236*  BUN 12  CREATININE 0.57  CALCIUM 9.0   GFR: Estimated Creatinine Clearance: 47.8 mL/min (by C-G formula based on SCr of 0.57 mg/dL). Liver Function Tests: No results for input(s): AST, ALT, ALKPHOS, BILITOT, PROT, ALBUMIN in the last 168 hours. No results for input(s): LIPASE, AMYLASE in the last 168 hours. No results for input(s): AMMONIA in the last 168 hours. Coagulation Profile: No results for input(s): INR, PROTIME in the last 168 hours. Cardiac Enzymes: No results for input(s): CKTOTAL, CKMB, CKMBINDEX, TROPONINI in the last 168 hours. BNP (last 3 results) No results for input(s): PROBNP in the last 8760 hours. HbA1C: No results for input(s): HGBA1C in the last 72 hours. CBG: Recent Labs  Lab 03/31/21 2009  GLUCAP 98   Lipid Profile: No results for input(s): CHOL, HDL, LDLCALC, TRIG, CHOLHDL, LDLDIRECT in the last 72 hours. Thyroid Function Tests: No results for input(s): TSH, T4TOTAL, FREET4, T3FREE, THYROIDAB in the last 72 hours. Anemia Panel: No results for input(s): VITAMINB12, FOLATE, FERRITIN, TIBC, IRON, RETICCTPCT in the last 72 hours. Urine analysis:    Component Value Date/Time   COLORURINE YELLOW (A) 06/30/2020 1325   APPEARANCEUR Clear 07/29/2020 1137   LABSPEC 1.023 06/30/2020 1325   PHURINE  5.0 06/30/2020 1325   GLUCOSEU Negative 07/29/2020 1137   HGBUR NEGATIVE 06/30/2020 1325   BILIRUBINUR Negative 07/29/2020 1137   KETONESUR 5 (A) 06/30/2020 1325   PROTEINUR Negative 07/29/2020 1137   PROTEINUR NEGATIVE 06/30/2020 1325   NITRITE Negative 07/29/2020 1137   NITRITE NEGATIVE 06/30/2020 1325   LEUKOCYTESUR Negative 07/29/2020 1137   LEUKOCYTESUR NEGATIVE 06/30/2020 1325    Radiological Exams on Admission: DG Chest 2 View  Result Date: 03/31/2021 CLINICAL DATA:  Shortness of breath. EXAM: CHEST - 2 VIEW COMPARISON:  January 28, 2021. FINDINGS: The heart size and mediastinal contours are within normal limits. No pneumothorax or pleural effusion is noted. Stable right apical and lower lobe opacities are noted which may represent post treatment change. No definite acute abnormality is noted. The visualized skeletal structures are unremarkable. IMPRESSION: Stable right lung findings are noted most consistent with post treatment change. No definite acute abnormality is noted. Electronically Signed   By: Marijo Conception M.D.   On: 03/31/2021 15:26   CT Angio Chest PE W and/or Wo Contrast  Result Date: 03/31/2021 CLINICAL DATA:  PE suspected, high prob Shortness of breath since having COVID 3 weeks ago. EXAM: CT ANGIOGRAPHY CHEST WITH CONTRAST TECHNIQUE: Multidetector CT imaging of the chest was performed using the standard protocol during bolus administration of intravenous contrast. Multiplanar CT image reconstructions and MIPs were obtained to evaluate the vascular anatomy. CONTRAST:  64m OMNIPAQUE IOHEXOL 350 MG/ML SOLN COMPARISON:  Radiograph earlier today. Noncontrast chest CT 03/11/2021. Chest CT 04/11/2020 FINDINGS: Cardiovascular: There are no filling defects within the pulmonary arteries to suggest pulmonary embolus. Atherosclerosis of the thoracic aorta. No aneurysm. Heart is normal in size. There is contrast refluxing into the hepatic veins and IVC. No pericardial effusion.  Mediastinum/Nodes: No mediastinal adenopathy. No hilar adenopathy. No esophageal wall thickening. No thyroid nodule. Lungs/Pleura: Breathing motion artifact limits detailed assessment. Moderately advanced emphysema.  Bandlike scarring at the right lung apex likely post treatment related, stable in appearance from prior exam. Streaky and bandlike opacities in the right middle lobe and right lung base, also stable in appearance from prior exam and likely representing post treatment related change. No evidence of acute airspace disease. 7 mm left upper lobe pulmonary nodule, series 6, image 25, is unchanged from recent prior. No new pulmonary nodules, with decreased sensitivity due to motion artifact. Upper Abdomen: Atherosclerosis of the upper abdominal aorta. Ingested material within the stomach. Musculoskeletal: Unchanged appearance of chronic right lateral seventh rib fracture, overlying post treatment related change. No acute osseous abnormalities are seen Review of the MIP images confirms the above findings. IMPRESSION: 1. No pulmonary embolus. 2. Contrast refluxing into the hepatic veins and IVC, suggesting elevated right heart pressures. 3. No other acute intrathoracic findings. No focal airspace disease. 4. Stable treatment related changes at the right lung apex, right middle lobe and right lung base. 5. Unchanged 7 mm left upper lobe pulmonary nodule over the past 3 weeks, continued CT follow-up is recommended as recommended on previous staging exam. Aortic Atherosclerosis (ICD10-I70.0) and Emphysema (ICD10-J43.9). Electronically Signed   By: Keith Rake M.D.   On: 03/31/2021 22:32      Assessment/Plan  Acute on chronic hypoxemic respiratory failure secondary to COPD exacerbation, recent COVID infection History of lung adenocarcinoma of the right lobe -Daily IV Solu-Medrol -DuoNeb scheduled q6hr  Elevated troponin -EKG had slight inferior ST depression at V5 and V6 not seen on prior.  She does  not endorse any chest pain. -Troponin of 21 and 32.  Will continue to trend.  If it continues to trend upwards with significant delta change, will consider placing on heparin infusion  Hx of COVID-19 viral infection - Patient reports being positive with a home test about 3 weeks ago. -Low utility of remdesivir at this point as she is several weeks out from her infection  Type 2 diabetes -Placed on sensitive sliding scale  Hypertension -Continue lisinopril-HCTZ  PAD -Continue Plavix  Iron deficiency anemia - Hemoglobin stable around 10.6  DVT prophylaxis:.Lovenox Code Status: Full Family Communication: Plan discussed with patient and significant other at bedside  disposition Plan: Home with at least 2 midnight stays  Consults called:  Admission status: inpatient  Level of care: Med-Surg  Status is: Inpatient    Dispo: The patient is from: Home              Anticipated d/c is to: Home              Patient currently is not medically stable to d/c.   Difficult to place patient No         Orene Desanctis DO [facility]   If 7PM-7AM, please contact night-coverage [email or web address]   03/31/2021, 11:37 PM

## 2021-03-31 NOTE — ED Provider Notes (Signed)
[facility] Emergency Department Provider Note  ____________________________________________  Time seen: Approximately 9:55 PM  I have reviewed the triage vital signs and the nursing notes.   HISTORY  Chief Complaint Shortness of Breath    HPI Angela Jensen is a 65 y.o. female with a history of lung cancer, COPD, diabetes, hypertension who wears 2 L nasal cannula oxygen while sleeping at night who complains of worsening shortness of breath over the last week.  Worse with walking, better with being still.  Denies chest pain.  She does have some nonproductive cough as well.  No fever chills or leg swelling.  She reports having COVID 3 weeks ago as determined by a home test that was positive.    Past Medical History:  Diagnosis Date   Anemia    Arthritis    Asthma    Atherosclerosis of native arteries of extremity with intermittent claudication ([place]) 05/26/2016   Cancer ([place]) 2012   Right Lung CA   COPD (chronic obstructive pulmonary disease) ([place])    Depression    Diabetes mellitus without complication ([place])    Patient takes Janumet   Essential hypertension 05/26/2016   Hypercholesteremia    Oxygen dependent    2L at nite    PAD (peripheral artery disease) ([place]) 06/22/2016   Peripheral vascular disease ([place])    Personal history of radiation therapy    Shortness of breath dyspnea    with exertion    Sialolithiasis    Sleep apnea    Wears dentures    full upper and lower     Patient Active Problem List   Diagnosis Date Noted   Acute on chronic respiratory failure with hypoxia ([place]) 03/31/2021   Pneumothorax after biopsy 05/03/2020   Pneumothorax on right 05/01/2020   Chest pain on breathing 04/21/2020   Pleuritic chest pain 04/11/2020   Right lower lobe pulmonary nodule 04/02/2020   Iron deficiency anemia 04/02/2020   Pica in adults 04/02/2020   Goals of care, counseling/discussion 03/14/2020   Acute non-recurrent pansinusitis 01/01/2020    Pain in left wrist 09/24/2019   Encounter for general adult medical examination with abnormal findings 06/28/2019   Elevated total protein 01/22/2019   Uncontrolled type 2 diabetes mellitus with hyperglycemia ([place]) 05/16/2018   Chronic obstructive pulmonary disease ([place]) 05/16/2018   Vitamin D deficiency 05/16/2018   Flu vaccine need 05/16/2018   Dysuria 05/16/2018   Cancer of lower lobe of right lung ([place]) 01/21/2018   Screening for breast cancer 10/12/2017   Personal history of tobacco use, presenting hazards to health 02/03/2017   PAD (peripheral artery disease) ([place]) 06/22/2016   Essential hypertension 05/26/2016   Diabetes ([place]) 05/26/2016   Hyperlipidemia 05/26/2016   Atherosclerosis of native arteries of extremity with intermittent claudication ([place]) 05/26/2016   Uterine leiomyoma 04/30/2016   Elevated CEA 01/31/2016   Special screening for malignant neoplasms, colon    Benign neoplasm of sigmoid colon    Primary lung cancer ([place])    Malignant neoplasm of upper lobe of right lung [facility])      Past Surgical History:  Procedure Laterality Date   CESAREAN SECTION     x3   COLONOSCOPY WITH PROPOFOL N/A 06/25/2015   Procedure: COLONOSCOPY WITH PROPOFOL;  Surgeon: Lucilla Lame, MD;  Location: [facility] ENDOSCOPY;  Service: Endoscopy;  Laterality: N/A;   COLONOSCOPY WITH PROPOFOL N/A 07/26/2020   Procedure: COLONOSCOPY WITH PROPOFOL;  Surgeon: Lucilla Lame, MD;  Location: [place];  Service: Endoscopy;  Laterality:  N/A;   CYST REMOVAL LEG     and on shoulder    ESOPHAGOGASTRODUODENOSCOPY (EGD) WITH PROPOFOL N/A 07/26/2020   Procedure: ESOPHAGOGASTRODUODENOSCOPY (EGD) WITH PROPOFOL;  Surgeon: Lucilla Lame, MD;  Location: [place];  Service: Endoscopy;  Laterality: N/A;  Diabetic - oral meds   LOWER EXTREMITY ANGIOGRAPHY Left 09/29/2018   Procedure: LOWER EXTREMITY ANGIOGRAPHY;  Surgeon: Algernon Huxley, MD;  Location: [place] CV LAB;  Service: Cardiovascular;   Laterality: Left;   LUNG BIOPSY  05 15 2013   has lung "spots"   PERIPHERAL VASCULAR CATHETERIZATION Left 06/01/2016   Procedure: Lower Extremity Angiography;  Surgeon: Algernon Huxley, MD;  Location: [place] CV LAB;  Service: Cardiovascular;  Laterality: Left;   PERIPHERAL VASCULAR CATHETERIZATION N/A 06/01/2016   Procedure: Abdominal Aortogram w/Lower Extremity;  Surgeon: Algernon Huxley, MD;  Location: [place] [facility];  Service: Cardiovascular;  Laterality: N/A;   PERIPHERAL VASCULAR CATHETERIZATION  06/01/2016   Procedure: Lower Extremity Intervention;  Surgeon: Algernon Huxley, MD;  Location: [place] CV LAB;  Service: Cardiovascular;;   PERIPHERAL VASCULAR CATHETERIZATION Right 06/08/2016   Procedure: Lower Extremity Angiography;  Surgeon: Algernon Huxley, MD;  Location: [place] [facility];  Service: Cardiovascular;  Laterality: Right;   PERIPHERAL VASCULAR CATHETERIZATION  06/08/2016   Procedure: Lower Extremity Intervention;  Surgeon: Algernon Huxley, MD;  Location: [place] [facility];  Service: Cardiovascular;;   SUBMANDIBULAR GLAND EXCISION Left 12/06/2020   Procedure: EXCISION SUBMANDIBULAR GLAND;  Surgeon: Beverly Gust, MD;  Location: [place];  Service: ENT;  Laterality: Left;  needs to be first case Diabetic - diet controlled     Prior to Admission medications   Medication Sig Start Date End Date Taking? Authorizing Provider  Accu-Chek FastClix Lancets MISC Use as directed twice a day diag E11.65 03/15/19  Yes Boscia, Heather E, NP  albuterol (PROVENTIL) (2.5 MG/3ML) 0.083% nebulizer solution Take 3 mLs (2.5 mg total) by nebulization every 6 (six) hours as needed for wheezing. 12/11/20  Yes [name], MD  albuterol (VENTOLIN HFA) 108 (90 Base) MCG/ACT inhaler Inhale 2 puffs into the lungs every 6 (six) hours as needed for wheezing or shortness of breath. 12/11/20  Yes [name], MD  clopidogrel (PLAVIX) 75 MG tablet Take 1 tablet (75 mg total) by mouth daily.  12/24/20  Yes [name], MD  ferrous sulfate 324 MG TBEC Take 324 mg by mouth.   Yes [provider]  Fluticasone-Umeclidin-Vilant (TRELEGY ELLIPTA) 100-62.5-25 MCG/INH AEPB Inhale 1 puff into the lungs daily. 03/10/21  Yes Abernathy, Yetta Flock, NP  gabapentin (NEURONTIN) 100 MG capsule Take 1 capsule (100 mg total) by mouth 2 (two) times daily. 11/04/20  Yes McDonough, Lauren K, PA-C  glucose blood (ACCU-CHEK AVIVA PLUS) test strip Use as instructed to check blood sugars three times daily.  E11.65 Patient taking differently: Use as instructed to check blood sugars three times daily.  E11.65 03/16/19  Yes Boscia, Greer Ee, NP  lisinopril-hydrochlorothiazide (ZESTORETIC) 10-12.5 MG tablet Take 1 tablet by mouth daily. 09/20/20  Yes [name], MD  meloxicam (MOBIC) 7.5 MG tablet Take 7.5 mg by mouth 2 (two) times daily. 03/10/21  Yes [provider]  metFORMIN (GLUCOPHAGE) 500 MG tablet Take 1 tablet (500 mg total) by mouth daily with breakfast. 03/12/21  Yes [name], MD  potassium chloride (KLOR-CON) 10 MEQ tablet Take one tab po qod 09/20/20  Yes [name], MD  Bartolo Darter  COVID-19 AG HOME TEST KIT Use as Directed on the Package Patient not taking: Reported on 03/31/2021 03/18/21   [provider]     Allergies Patient has no known allergies.   Family History  Problem Relation Age of Onset   Lung cancer Father    Diabetes Mother    Hypercholesterolemia Mother    Diabetes Sister    Diabetes Maternal Grandmother    Diabetes Paternal Grandmother    Diabetes Sister    Heart attack Brother    Coronary artery disease Brother    Vascular Disease Brother    Hypertension Sister    Heart attack Brother     Social History Social History   Tobacco Use   Smoking status: Former    Packs/day: 1.00    Years: 37.00    Pack years: 37.00    Types: Cigarettes    Quit date: 02/06/2010    Years since quitting: 11.1   Smokeless tobacco: Former    Types: Snuff   Vaping Use   Vaping Use: Never used  Substance Use Topics   Alcohol use: Not Currently    Alcohol/week: 5.0 standard drinks    Types: 5 Cans of beer per week    Comment: /h x of alcohol abuse -stopped 2012- now drinks 5 beer per w   Drug use: Not Currently    Types: Marijuana, "Crack" cocaine, Cocaine    Comment: hx of cocaine use- last use 2015; last use sat marijuana6/22/19,     Review of Systems  Constitutional:   No fever or chills.  ENT:   No sore throat. No rhinorrhea. Cardiovascular:   No chest pain or syncope. Respiratory:   Positive shortness of breath and cough. Gastrointestinal:   Negative for abdominal pain, vomiting and diarrhea.  Musculoskeletal:   Negative for focal pain or swelling All other systems reviewed and are negative except as documented above in ROS and HPI.  ____________________________________________   PHYSICAL EXAM:  VITAL SIGNS: ED Triage Vitals [03/31/21 1441]  Enc Vitals Group     BP (!) 139/98     Pulse Rate 90     Resp 20     Temp 98.7 F (37.1 C)     Temp Source Oral     SpO2 100 %     Weight 112 lb (50.8 kg)     Height '4\' 11"'  (1.499 m)     Head Circumference      Peak Flow      Pain Score 0     Pain Loc      Pain Edu?      Excl. in [place]?     Vital signs reviewed, nursing assessments reviewed.   Constitutional:   Alert and oriented. Non-toxic appearance. Eyes:   Conjunctivae are normal. EOMI. PERRL. ENT      Head:   Normocephalic and atraumatic.      Nose:   Wearing a mask.      Mouth/Throat:   Wearing a mask.      Neck:   No meningismus. Full ROM. Hematological/Lymphatic/Immunilogical:   No cervical lymphadenopathy. Cardiovascular:   Tachycardia heart rate 100. Symmetric bilateral radial and DP pulses.  No murmurs. Cap refill less than 2 seconds. Respiratory:   Tachypnea respiratory rate 22.  Oxygen saturation 88% on room air at rest.  Lungs clear without wheezing or crackles. Gastrointestinal:   Soft and nontender. Non  distended. There is no CVA tenderness.  No rebound, rigidity, or guarding. Genitourinary:   deferred  Musculoskeletal:   Normal range of motion in all extremities. No joint effusions.  No lower extremity tenderness.  No edema. Neurologic:   Normal speech and language.  Motor grossly intact. No acute focal neurologic deficits are appreciated.  Skin:    Skin is warm, dry and intact. No rash noted.  No petechiae, purpura, or bullae.  ____________________________________________    LABS (pertinent positives/negatives) (all labs ordered are listed, but only abnormal results are displayed) Labs Reviewed  CBC - Abnormal; Notable for the following components:      Result Value   RBC 3.56 (*)    Hemoglobin 10.6 (*)    HCT 33.5 (*)    All other components within normal limits  BASIC METABOLIC PANEL - Abnormal; Notable for the following components:   Glucose, Bld 236 (*)    All other components within normal limits  D-DIMER, QUANTITATIVE (NOT AT [facility]) - Abnormal; Notable for the following components:   D-Dimer, Quant 1.63 (*)    All other components within normal limits  TROPONIN I (HIGH SENSITIVITY) - Abnormal; Notable for the following components:   Troponin I (High Sensitivity) 21 (*)    All other components within normal limits  TROPONIN I (HIGH SENSITIVITY) - Abnormal; Notable for the following components:   Troponin I (High Sensitivity) 32 (*)    All other components within normal limits  RESP PANEL BY RT-PCR (FLU A&B, COVID) ARPGX2  CBG MONITORING, ED  TROPONIN I (HIGH SENSITIVITY)   ____________________________________________   EKG  Interpreted by me Sinus rhythm rate of 96, normal axis, left bundle branch block.  No acute ischemic changes  ____________________________________________    RADIOLOGY  DG Chest 2 View  Result Date: 03/31/2021 CLINICAL DATA:  Shortness of breath. EXAM: CHEST - 2 VIEW COMPARISON:  January 28, 2021. FINDINGS: The heart size and mediastinal  contours are within normal limits. No pneumothorax or pleural effusion is noted. Stable right apical and lower lobe opacities are noted which may represent post treatment change. No definite acute abnormality is noted. The visualized skeletal structures are unremarkable. IMPRESSION: Stable right lung findings are noted most consistent with post treatment change. No definite acute abnormality is noted. Electronically Signed   By: Marijo Conception M.D.   On: 03/31/2021 15:26   CT Angio Chest PE W and/or Wo Contrast  Result Date: 03/31/2021 CLINICAL DATA:  PE suspected, high prob Shortness of breath since having COVID 3 weeks ago. EXAM: CT ANGIOGRAPHY CHEST WITH CONTRAST TECHNIQUE: Multidetector CT imaging of the chest was performed using the standard protocol during bolus administration of intravenous contrast. Multiplanar CT image reconstructions and MIPs were obtained to evaluate the vascular anatomy. CONTRAST:  51m OMNIPAQUE IOHEXOL 350 MG/ML SOLN COMPARISON:  Radiograph earlier today. Noncontrast chest CT 03/11/2021. Chest CT 04/11/2020 FINDINGS: Cardiovascular: There are no filling defects within the pulmonary arteries to suggest pulmonary embolus. Atherosclerosis of the thoracic aorta. No aneurysm. Heart is normal in size. There is contrast refluxing into the hepatic veins and IVC. No pericardial effusion. Mediastinum/Nodes: No mediastinal adenopathy. No hilar adenopathy. No esophageal wall thickening. No thyroid nodule. Lungs/Pleura: Breathing motion artifact limits detailed assessment. Moderately advanced emphysema. Bandlike scarring at the right lung apex likely post treatment related, stable in appearance from prior exam. Streaky and bandlike opacities in the right middle lobe and right lung base, also stable in appearance from prior exam and likely representing post treatment related change. No evidence of acute airspace disease. 7 mm left upper lobe pulmonary nodule, series 6,  image 25, is unchanged  from recent prior. No new pulmonary nodules, with decreased sensitivity due to motion artifact. Upper Abdomen: Atherosclerosis of the upper abdominal aorta. Ingested material within the stomach. Musculoskeletal: Unchanged appearance of chronic right lateral seventh rib fracture, overlying post treatment related change. No acute osseous abnormalities are seen Review of the MIP images confirms the above findings. IMPRESSION: 1. No pulmonary embolus. 2. Contrast refluxing into the hepatic veins and IVC, suggesting elevated right heart pressures. 3. No other acute intrathoracic findings. No focal airspace disease. 4. Stable treatment related changes at the right lung apex, right middle lobe and right lung base. 5. Unchanged 7 mm left upper lobe pulmonary nodule over the past 3 weeks, continued CT follow-up is recommended as recommended on previous staging exam. Aortic Atherosclerosis (ICD10-I70.0) and Emphysema (ICD10-J43.9). Electronically Signed   By: Keith Rake M.D.   On: 03/31/2021 22:32    ____________________________________________   PROCEDURES Procedures  ____________________________________________  DIFFERENTIAL DIAGNOSIS   Pneumonia, pleural effusion, pulmonary edema, pulmonary embolism, non-STEMI, COPD exacerbation  CLINICAL IMPRESSION / ASSESSMENT AND PLAN / ED COURSE  Medications ordered in the ED: Medications  methylPREDNISolone sodium succinate (SOLU-MEDROL) 125 mg/2 mL injection 125 mg (has no administration in time range)  ipratropium-albuterol (DUONEB) 0.5-2.5 (3) MG/3ML nebulizer solution 3 mL (has no administration in time range)  iohexol (OMNIPAQUE) 350 MG/ML injection 75 mL (75 mLs Intravenous Contrast Given 03/31/21 2212)    Pertinent labs & imaging results that were available during my care of the patient were reviewed by me and considered in my medical decision making (see chart for details).  Angela Jensen was evaluated in Emergency Department on 03/31/2021 for the  symptoms described in the history of present illness. She was evaluated in the context of the global COVID-19 pandemic, which necessitated consideration that the patient might be at risk for infection with the SARS-CoV-2 virus that causes COVID-19. Institutional protocols and algorithms that pertain to the evaluation of patients at risk for COVID-19 are in a state of rapid change based on information released by regulatory bodies including the CDC and federal and state organizations. These policies and algorithms were followed during the patient's care in the ED.   Patient presents with worsening shortness of breath, some hypoxia compared to her baseline.  Chest x-ray is unremarkable.  Will obtain CT scan to evaluate for PE or occult airspace disease.  Troponin is 20 and 30 today, compared to 8  one week ago at [facility].  With hypoxia and slightly elevated troponin, patient warrants overnight hospitalization for further work-up and management.  If CT negative will give steroids and bronchodilators.  ----------------------------------------- 11:02 PM on 03/31/2021 ----------------------------------------- CT unremarkable.  With room air hypoxia, exertional symptoms and slightly elevated troponin, will hospitalize for further management.  We will give Solu-Medrol and DuoNeb for now.      ____________________________________________   FINAL CLINICAL IMPRESSION(S) / ED DIAGNOSES    Final diagnoses:  COPD exacerbation ([place])  Acute respiratory failure with hypoxia [facility])     ED Discharge Orders     None       Portions of this note were generated with dragon dictation software. Dictation errors may occur despite best attempts at proofreading.    Carrie Mew, MD 03/31/21 2302

## 2021-03-31 NOTE — ED Notes (Signed)
Escorted pt to commode in room approx 10 ft and back to beed. Pt O2 went to 79%. Pt able to recover quickly on 2L O2.

## 2021-04-01 ENCOUNTER — Encounter: Payer: Self-pay | Admitting: Family Medicine

## 2021-04-01 DIAGNOSIS — R778 Other specified abnormalities of plasma proteins: Secondary | ICD-10-CM

## 2021-04-01 DIAGNOSIS — R7989 Other specified abnormal findings of blood chemistry: Secondary | ICD-10-CM

## 2021-04-01 LAB — TROPONIN I (HIGH SENSITIVITY)
Troponin I (High Sensitivity): 24 ng/L — ABNORMAL HIGH (ref <18)
Troponin I (High Sensitivity): 30 ng/L — ABNORMAL HIGH (ref <18)

## 2021-04-01 LAB — BASIC METABOLIC PANEL
Anion gap: 5 (ref 5–15)
BUN: 10 mg/dL (ref 8–23)
CO2: 30 mmol/L (ref 22–32)
Calcium: 8.8 mg/dL — ABNORMAL LOW (ref 8.9–10.3)
Chloride: 100 mmol/L (ref 98–111)
Creatinine, Ser: 0.61 mg/dL (ref 0.44–1.00)
GFR, Estimated: >60 mL/min (ref >60)
Glucose, Bld: 234 mg/dL — ABNORMAL HIGH (ref 70–99)
Potassium: 3.5 mmol/L (ref 3.5–5.1)
Sodium: 135 mmol/L (ref 135–145)

## 2021-04-01 LAB — GLUCOSE, CAPILLARY
Glucose-Capillary: 412 mg/dL — ABNORMAL HIGH (ref 70–99)
Glucose-Capillary: 373 mg/dL — ABNORMAL HIGH (ref 70–99)
Glucose-Capillary: 174 mg/dL — ABNORMAL HIGH (ref 70–99)
Glucose-Capillary: 291 mg/dL — ABNORMAL HIGH (ref 70–99)

## 2021-04-01 MED ORDER — GABAPENTIN 100 MG PO CAPS
100.0000 mg | ORAL_CAPSULE | Freq: Two times a day (BID) | ORAL | Status: DC
  Administered 2021-04-01 – 2021-04-05 (×10): 100 mg via ORAL
  Filled 2021-04-01 (×10): qty 1

## 2021-04-01 MED ORDER — FERROUS SULFATE 325 (65 FE) MG PO TABS
325.0000 mg | ORAL_TABLET | Freq: Every day | ORAL | Status: DC
  Administered 2021-04-01: 325 mg via ORAL
  Filled 2021-04-01: qty 1

## 2021-04-01 MED ORDER — LISINOPRIL 10 MG PO TABS
10.0000 mg | ORAL_TABLET | Freq: Every day | ORAL | Status: DC
  Administered 2021-04-01 – 2021-04-05 (×5): 10 mg via ORAL
  Filled 2021-04-01 (×5): qty 1

## 2021-04-01 MED ORDER — FLUTICASONE FUROATE-VILANTEROL 100-25 MCG/INH IN AEPB
1.0000 | INHALATION_SPRAY | Freq: Every day | RESPIRATORY_TRACT | Status: DC
  Administered 2021-04-02 – 2021-04-05 (×4): 1 via RESPIRATORY_TRACT
  Filled 2021-04-01: qty 28

## 2021-04-01 MED ORDER — CLOPIDOGREL BISULFATE 75 MG PO TABS
75.0000 mg | ORAL_TABLET | Freq: Every day | ORAL | Status: DC
  Administered 2021-04-01 – 2021-04-05 (×5): 75 mg via ORAL
  Filled 2021-04-01 (×5): qty 1

## 2021-04-01 MED ORDER — FLUTICASONE-UMECLIDIN-VILANT 100-62.5-25 MCG/INH IN AEPB: 1.0000 | INHALATION_SPRAY | Freq: Every day | RESPIRATORY_TRACT | Status: DC

## 2021-04-01 MED ORDER — INSULIN ASPART 100 UNIT/ML IJ SOLN
0.0000 [IU] | Freq: Three times a day (TID) | INTRAMUSCULAR | Status: DC
  Administered 2021-04-01: 20 [IU] via SUBCUTANEOUS
  Administered 2021-04-01 – 2021-04-02 (×2): 4 [IU] via SUBCUTANEOUS
  Administered 2021-04-02 (×2): 15 [IU] via SUBCUTANEOUS
  Administered 2021-04-03: 20 [IU] via SUBCUTANEOUS
  Administered 2021-04-03: 15 [IU] via SUBCUTANEOUS
  Administered 2021-04-04: 4 [IU] via SUBCUTANEOUS
  Administered 2021-04-04 (×2): 20 [IU] via SUBCUTANEOUS
  Administered 2021-04-05: 11 [IU] via SUBCUTANEOUS
  Administered 2021-04-05: 4 [IU] via SUBCUTANEOUS
  Filled 2021-04-01 (×12): qty 1

## 2021-04-01 MED ORDER — INSULIN GLARGINE-YFGN 100 UNIT/ML ~~LOC~~ SOLN
10.0000 [IU] | Freq: Every day | SUBCUTANEOUS | Status: DC
  Administered 2021-04-01 – 2021-04-03 (×3): 10 [IU] via SUBCUTANEOUS
  Filled 2021-04-01 (×4): qty 0.1

## 2021-04-01 MED ORDER — IPRATROPIUM-ALBUTEROL 0.5-2.5 (3) MG/3ML IN SOLN
3.0000 mL | Freq: Three times a day (TID) | RESPIRATORY_TRACT | Status: DC
  Administered 2021-04-01 – 2021-04-02 (×2): 3 mL via RESPIRATORY_TRACT
  Filled 2021-04-01 (×2): qty 3

## 2021-04-01 MED ORDER — POTASSIUM CHLORIDE CRYS ER 10 MEQ PO TBCR
10.0000 meq | EXTENDED_RELEASE_TABLET | ORAL | Status: DC
  Administered 2021-04-01 – 2021-04-05 (×3): 10 meq via ORAL
  Filled 2021-04-01 (×4): qty 1

## 2021-04-01 MED ORDER — UMECLIDINIUM BROMIDE 62.5 MCG/INH IN AEPB
1.0000 | INHALATION_SPRAY | Freq: Every day | RESPIRATORY_TRACT | Status: DC
  Administered 2021-04-02 – 2021-04-05 (×4): 1 via RESPIRATORY_TRACT
  Filled 2021-04-01: qty 7

## 2021-04-01 MED ORDER — INSULIN ASPART 100 UNIT/ML IJ SOLN
0.0000 [IU] | Freq: Every day | INTRAMUSCULAR | Status: DC
  Administered 2021-04-01: 3 [IU] via SUBCUTANEOUS
  Administered 2021-04-02: 4 [IU] via SUBCUTANEOUS
  Administered 2021-04-03: 2 [IU] via SUBCUTANEOUS
  Administered 2021-04-04: 4 [IU] via SUBCUTANEOUS
  Filled 2021-04-01 (×4): qty 1

## 2021-04-01 MED ORDER — LISINOPRIL-HYDROCHLOROTHIAZIDE 10-12.5 MG PO TABS: 1.0000 | ORAL_TABLET | Freq: Every day | ORAL | Status: DC

## 2021-04-01 MED ORDER — HYDROCHLOROTHIAZIDE 12.5 MG PO CAPS
12.5000 mg | ORAL_CAPSULE | Freq: Every day | ORAL | Status: DC
  Administered 2021-04-01 – 2021-04-05 (×5): 12.5 mg via ORAL
  Filled 2021-04-01 (×5): qty 1

## 2021-04-01 MED ORDER — MELOXICAM 7.5 MG PO TABS
7.5000 mg | ORAL_TABLET | Freq: Two times a day (BID) | ORAL | Status: DC
  Administered 2021-04-01 – 2021-04-05 (×9): 7.5 mg via ORAL
  Filled 2021-04-01 (×10): qty 1

## 2021-04-01 MED ORDER — FERROUS SULFATE 325 (65 FE) MG PO TABS
325.0000 mg | ORAL_TABLET | Freq: Two times a day (BID) | ORAL | Status: DC
  Administered 2021-04-01 – 2021-04-05 (×8): 325 mg via ORAL
  Filled 2021-04-01 (×7): qty 1

## 2021-04-01 NOTE — Progress Notes (Signed)
Patient arrived to unit 1A room 137 in stable condition. Alert and oriented x4. Denies pain. Oxygen on at 2L [place]. Plan of care reviewed with patient. Call bell within reach. Bed alarm on.

## 2021-04-01 NOTE — Progress Notes (Signed)
[facility] PROGRESS NOTE    MICKY OVERTURF  ZMO:[id number] DOB: [birth year] DOA: 03/31/2021 PCP: [name], MD      Brief Narrative:  Mrs. Bollig is a 65 y.o. 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  Patient diagnosed with COVID 3 weeks ago with a self test at home after family members became sick.  PCP put her on Trelegy, but her shortness of breath and wheezing got worse.  In the ER, patient desaturated to 70% ambulating to the commode.  CT angiogram of the chest showed no PE, or pneumonia.  High-sensitivity troponin, minimally elevated, no chest pain.  Started on IV Solu-Medrol and bronchodilators and hospitalist were called to admit    Assessment & Plan:  COPD exacerbation with acute on chronic hypoxic respiratory failure Patient presented with dyspnea, tachypnea, and hypoxia to 70% with ambulation, and is subsequently been unable to wean back to her home room air. - Continue steoids - COntinue scheduled bronchodilators    Recent COVID Resolved, no pneumonia on CXR, no indication for COIVD specific treatments  Peripheral vascular disease Hypertension BP normal - Continue lisinopril, HCTZ - COntinue Palvix  Type 2 diabetes with polyneuropathy Glucose is somewhat elevated on steroids - Start lantus - Continue SS corrections, increase dose - Continue gabapentin -Hold metformin  History of lung cancer CT chest without notable change -Follow-up with established oncology   Anemia of chronic disease Hgb stable, no bleeding     Disposition: Status is: Inpatient  Remains inpatient appropriate because: she remains very dyspneic with exertion  Dispo: The patient is from: Home              Anticipated d/c is to: Home              Patient currently is not medically stable to d/c.   Difficult to place patient  No   Patient was admitted with a severe exacerbation of COPD.  She is still very dyspneic with ambulating just to the bathroom and back.  I suspect that she will need another day, may be 2 prior to discharge home.    Level of care: Med-Surg       MDM: The below labs and imaging reports were reviewed and summarized above.  Medication management as above.    DVT prophylaxis: enoxaparin (LOVENOX) injection 40 mg Start: 04/01/21 0000  Code Status: FULL Family Communication: daughter by phone, no answer           Subjective: Still feels "wheezy", still tight, still out of breath with minimal exertion.  No swelling, no sputum, no fever, no confusion.  No feer.  Objective: Vitals:   04/01/21 0728 04/01/21 0835 04/01/21 1143 04/01/21 1525  BP: 126/87  132/74 98/68  Pulse: 96  99 95  Resp: 15  15 15   Temp: 98 F (36.7 C)  98.6 F (37 C) 98.2 F (36.8 C)  TempSrc:      SpO2: 97% 98% 98% 100%  Weight:      Height:        Intake/Output Summary (Last 24 hours) at 04/01/2021 1620 Last data filed at 04/01/2021 1359 Gross per 24 hour  Intake 360 ml  Output 600 ml  Net -240 ml   Filed Weights   03/31/21 1441 04/01/21 0003  Weight: 50.8 kg 47.3 kg    Examination:  General appearance: thin elderly adult female, alert and in no acute distress.   HEENT: Anicteric, conjunctiva pink, lids and lashes normal. No nasal deformity, discharge, epistaxis.  Lips moist, eentulous, OP dry, no oral lesions.   Skin: Warm and dry.  no jaundice.  No suspicious rashes or lesions. Cardiac: RRR, nl S1-S2, no murmurs appreciated.  Capillary refill is brisk.  JVP not visible.  No LE edema.  Radial  pulses 2+ and symmetric. Respiratory: Normal respiratory rate and rhythm at rest.  Cough noted.  Sounds very tight, faint wheeze.  No rales  Abdomen: Abdomen soft.  no TTP. No ascites, distension, hepatosplenomegaly.   MSK: No deformities or effusions. Neuro: Awake and alert.  EOMI, moves all  extremities. Speech fluent.    Psych: Sensorium intact and responding to questions, attention normal. Affect normal.  Judgment and insight appear normal.    Data Reviewed: I have personally reviewed following labs and imaging studies:  CBC: Recent Labs  Lab 03/31/21 1444  WBC 4.5  HGB 10.6*  HCT 33.5*  MCV 94.1  PLT 122   Basic Metabolic Panel: Recent Labs  Lab 03/31/21 1444 04/01/21 0023  NA 136 135  K 3.8 3.5  CL 100 100  CO2 29 30  GLUCOSE 236* 234*  BUN 12 10  CREATININE 0.57 0.61  CALCIUM 9.0 8.8*   GFR: Estimated Creatinine Clearance: 47.8 mL/min (by C-G formula based on SCr of 0.61 mg/dL). Liver Function Tests: No results for input(s): AST, ALT, ALKPHOS, BILITOT, PROT, ALBUMIN in the last 168 hours. No results for input(s): LIPASE, AMYLASE in the last 168 hours. No results for input(s): AMMONIA in the last 168 hours. Coagulation Profile: No results for input(s): INR, PROTIME in the last 168 hours. Cardiac Enzymes: No results for input(s): CKTOTAL, CKMB, CKMBINDEX, TROPONINI in the last 168 hours. BNP (last 3 results) No results for input(s): PROBNP in the last 8760 hours. HbA1C: No results for input(s): HGBA1C in the last 72 hours. CBG: Recent Labs  Lab 03/31/21 2009 04/01/21 0805 04/01/21 1144  GLUCAP 98 412* 373*   Lipid Profile: No results for input(s): CHOL, HDL, LDLCALC, TRIG, CHOLHDL, LDLDIRECT in the last 72 hours. Thyroid Function Tests: No results for input(s): TSH, T4TOTAL, FREET4, T3FREE, THYROIDAB in the last 72 hours. Anemia Panel: No results for input(s): VITAMINB12, FOLATE, FERRITIN, TIBC, IRON, RETICCTPCT in the last 72 hours. Urine analysis:    Component Value Date/Time   COLORURINE YELLOW (A) 06/30/2020 1325   APPEARANCEUR Clear 07/29/2020 1137   LABSPEC 1.023 06/30/2020 1325   PHURINE 5.0 06/30/2020 1325   GLUCOSEU Negative 07/29/2020 1137   HGBUR NEGATIVE 06/30/2020 1325   BILIRUBINUR Negative 07/29/2020 1137   KETONESUR  5 (A) 06/30/2020 1325   PROTEINUR Negative 07/29/2020 1137   PROTEINUR NEGATIVE 06/30/2020 1325   NITRITE Negative 07/29/2020 1137   NITRITE NEGATIVE 06/30/2020 1325   LEUKOCYTESUR Negative 07/29/2020 1137   LEUKOCYTESUR NEGATIVE 06/30/2020 1325   Sepsis Labs: @LABRCNTIP (procalcitonin:4,lacticacidven:4)  ) Recent Results (from the past 240 hour(s))  Resp Panel by RT-PCR (Flu A&B, Covid) Nasopharyngeal Swab     Status: None   Collection Time: 03/31/21  9:38 PM   Specimen: Nasopharyngeal Swab; Nasopharyngeal(NP) swabs in vial transport medium  Result Value Ref Range Status   SARS Coronavirus 2 by RT PCR NEGATIVE NEGATIVE Final    Comment: (NOTE) SARS-CoV-2 target nucleic acids are NOT DETECTED.  The SARS-CoV-2 RNA is generally detectable in upper respiratory specimens during the acute phase of infection. The lowest  concentration of SARS-CoV-2 viral copies this assay can detect is 138 copies/mL. A negative result does not preclude SARS-Cov-2 infection and should not be used as the sole basis for treatment or other patient management decisions. A negative result may occur with  improper specimen collection/handling, submission of specimen other than nasopharyngeal swab, presence of viral mutation(s) within the areas targeted by this assay, and inadequate number of viral copies(<138 copies/mL). A negative result must be combined with clinical observations, patient history, and epidemiological information. The expected result is Negative.  Fact Sheet for Patients:  EntrepreneurPulse.com.au  Fact Sheet for Healthcare Providers:  IncredibleEmployment.be  This test is no t yet approved or cleared by the [place] FDA and  has been authorized for detection and/or diagnosis of SARS-CoV-2 by FDA under an Emergency Use Authorization (EUA). This EUA will remain  in effect (meaning this test can be used) for the duration of the COVID-19 declaration  under Section 564(b)(1) of the Act, 21 U.S.C.section 360bbb-3(b)(1), unless the authorization is terminated  or revoked sooner.       Influenza A by PCR NEGATIVE NEGATIVE Final   Influenza B by PCR NEGATIVE NEGATIVE Final    Comment: (NOTE) The Xpert Xpress SARS-CoV-2/FLU/RSV plus assay is intended as an aid in the diagnosis of influenza from Nasopharyngeal swab specimens and should not be used as a sole basis for treatment. Nasal washings and aspirates are unacceptable for Xpert Xpress SARS-CoV-2/FLU/RSV testing.  Fact Sheet for Patients: EntrepreneurPulse.com.au  Fact Sheet for Healthcare Providers: IncredibleEmployment.be  This test is not yet approved or cleared by the [place] FDA and has been authorized for detection and/or diagnosis of SARS-CoV-2 by FDA under an Emergency Use Authorization (EUA). This EUA will remain in effect (meaning this test can be used) for the duration of the COVID-19 declaration under Section 564(b)(1) of the Act, 21 U.S.C. section 360bbb-3(b)(1), unless the authorization is terminated or revoked.  Performed at [facility], [address] [place], [place] [address]          Radiology Studies: DG Chest 2 View  Result Date: 03/31/2021 CLINICAL DATA:  Shortness of breath. EXAM: CHEST - 2 VIEW COMPARISON:  January 28, 2021. FINDINGS: The heart size and mediastinal contours are within normal limits. No pneumothorax or pleural effusion is noted. Stable right apical and lower lobe opacities are noted which may represent post treatment change. No definite acute abnormality is noted. The visualized skeletal structures are unremarkable. IMPRESSION: Stable right lung findings are noted most consistent with post treatment change. No definite acute abnormality is noted. Electronically Signed   By: Marijo Conception M.D.   On: 03/31/2021 15:26   CT Angio Chest PE W and/or Wo Contrast  Result Date:  03/31/2021 CLINICAL DATA:  PE suspected, high prob Shortness of breath since having COVID 3 weeks ago. EXAM: CT ANGIOGRAPHY CHEST WITH CONTRAST TECHNIQUE: Multidetector CT imaging of the chest was performed using the standard protocol during bolus administration of intravenous contrast. Multiplanar CT image reconstructions and MIPs were obtained to evaluate the vascular anatomy. CONTRAST:  38mL OMNIPAQUE IOHEXOL 350 MG/ML SOLN COMPARISON:  Radiograph earlier today. Noncontrast chest CT 03/11/2021. Chest CT 04/11/2020 FINDINGS: Cardiovascular: There are no filling defects within the pulmonary arteries to suggest pulmonary embolus. Atherosclerosis of the thoracic aorta. No aneurysm. Heart is normal in size. There is contrast refluxing into the hepatic veins and IVC. No pericardial effusion. Mediastinum/Nodes: No mediastinal adenopathy. No hilar adenopathy. No esophageal wall thickening. No thyroid nodule. Lungs/Pleura: Breathing  motion artifact limits detailed assessment. Moderately advanced emphysema. Bandlike scarring at the right lung apex likely post treatment related, stable in appearance from prior exam. Streaky and bandlike opacities in the right middle lobe and right lung base, also stable in appearance from prior exam and likely representing post treatment related change. No evidence of acute airspace disease. 7 mm left upper lobe pulmonary nodule, series 6, image 25, is unchanged from recent prior. No new pulmonary nodules, with decreased sensitivity due to motion artifact. Upper Abdomen: Atherosclerosis of the upper abdominal aorta. Ingested material within the stomach. Musculoskeletal: Unchanged appearance of chronic right lateral seventh rib fracture, overlying post treatment related change. No acute osseous abnormalities are seen Review of the MIP images confirms the above findings. IMPRESSION: 1. No pulmonary embolus. 2. Contrast refluxing into the hepatic veins and IVC, suggesting elevated right heart  pressures. 3. No other acute intrathoracic findings. No focal airspace disease. 4. Stable treatment related changes at the right lung apex, right middle lobe and right lung base. 5. Unchanged 7 mm left upper lobe pulmonary nodule over the past 3 weeks, continued CT follow-up is recommended as recommended on previous staging exam. Aortic Atherosclerosis (ICD10-I70.0) and Emphysema (ICD10-J43.9). Electronically Signed   By: Keith Rake M.D.   On: 03/31/2021 22:32        Scheduled Meds:  clopidogrel  75 mg Oral Daily   enoxaparin (LOVENOX) injection  40 mg Subcutaneous Q2200   ferrous sulfate  325 mg Oral Q breakfast   fluticasone furoate-vilanterol  1 puff Inhalation Daily   gabapentin  100 mg Oral BID   lisinopril  10 mg Oral Daily   And   hydrochlorothiazide  12.5 mg Oral Daily   insulin aspart  0-20 Units Subcutaneous TID WC   insulin aspart  0-5 Units Subcutaneous QHS   insulin glargine-yfgn  10 Units Subcutaneous Daily   ipratropium-albuterol  3 mL Nebulization Q6H   meloxicam  7.5 mg Oral BID   methylPREDNISolone (SOLU-MEDROL) injection  40 mg Intravenous Daily   potassium chloride  10 mEq Oral QODAY   umeclidinium bromide  1 puff Inhalation Daily   Continuous Infusions:   LOS: 1 day    Time spent: 35 minutes    Edwin Dada, MD [facility] 04/01/2021, 4:20 PM     Please page though AMION or Epic secure chat:  For [employer], Adult nurse

## 2021-04-02 LAB — GLUCOSE, CAPILLARY
Glucose-Capillary: 184 mg/dL — ABNORMAL HIGH (ref 70–99)
Glucose-Capillary: 305 mg/dL — ABNORMAL HIGH (ref 70–99)
Glucose-Capillary: 317 mg/dL — ABNORMAL HIGH (ref 70–99)
Glucose-Capillary: 324 mg/dL — ABNORMAL HIGH (ref 70–99)

## 2021-04-02 LAB — HEMOGLOBIN A1C
Hgb A1c MFr Bld: 8.2 % — ABNORMAL HIGH (ref 4.8–5.6)
Mean Plasma Glucose: 188.64 mg/dL

## 2021-04-02 MED ORDER — HYDROCODONE-ACETAMINOPHEN 5-325 MG PO TABS
1.0000 | ORAL_TABLET | Freq: Four times a day (QID) | ORAL | Status: AC | PRN
  Administered 2021-04-02 – 2021-04-03 (×2): 1 via ORAL
  Filled 2021-04-02 (×2): qty 1

## 2021-04-02 MED ORDER — PREDNISONE 20 MG PO TABS
40.0000 mg | ORAL_TABLET | Freq: Every day | ORAL | Status: DC
  Administered 2021-04-02 – 2021-04-05 (×4): 40 mg via ORAL
  Filled 2021-04-02 (×4): qty 2

## 2021-04-02 MED ORDER — IPRATROPIUM-ALBUTEROL 0.5-2.5 (3) MG/3ML IN SOLN
3.0000 mL | Freq: Three times a day (TID) | RESPIRATORY_TRACT | Status: DC
  Administered 2021-04-02 – 2021-04-05 (×9): 3 mL via RESPIRATORY_TRACT
  Filled 2021-04-02 (×9): qty 3

## 2021-04-02 MED ORDER — IPRATROPIUM-ALBUTEROL 0.5-2.5 (3) MG/3ML IN SOLN: 3.0000 mL | Freq: Four times a day (QID) | RESPIRATORY_TRACT | Status: DC | PRN

## 2021-04-02 MED ORDER — IPRATROPIUM-ALBUTEROL 0.5-2.5 (3) MG/3ML IN SOLN: 3.0000 mL | Freq: Four times a day (QID) | RESPIRATORY_TRACT | Status: DC

## 2021-04-02 NOTE — Progress Notes (Signed)
Inpatient Diabetes Program Recommendations  AACE/ADA: New Consensus Statement on Inpatient Glycemic Control   Target Ranges:  Prepandial:   less than 140 mg/dL      Peak postprandial:   less than 180 mg/dL (1-2 hours)      Critically ill patients:  140 - 180 mg/dL   Results for Angela Jensen, Angela Jensen (MRN [id number]) as of 04/02/2021 12:03  Ref. Range 04/01/2021 08:05 04/01/2021 11:44 04/01/2021 16:44 04/01/2021 20:41 04/02/2021 08:08 04/02/2021 11:07  Glucose-Capillary Latest Ref Range: 70 - 99 mg/dL 412 (H) 373 (H) 174 (H) 291 (H) 184 (H) 305 (H)    Review of Glycemic Control  Diabetes history: DM2 Outpatient Diabetes medications: Metformin 500 mg daily Current orders for Inpatient glycemic control: Semglee 10 units daily, Novolog 0-20 units TID with meals, Novolog 0-5 units QHS; Prednisone 40 mg daily  Inpatient Diabetes Program Recommendations:    Insulin: If steroids are continued as ordered, please consider increasing Semglee to 13 units daily and Novolog 3 units TID with meals for meal coverage if patient eats at least 50% of meals.  Thanks, Barnie Alderman, RN, MSN, CDE Diabetes Coordinator Inpatient Diabetes Program [phone] (Team Pager from 8am to 5pm)

## 2021-04-02 NOTE — Evaluation (Addendum)
Physical Therapy Evaluation Patient Details Name: Angela Jensen MRN: [id number] DOB: 1956-06-24 Today's Date: 04/02/2021   History of Present Illness  Pt is a 65 y.o. F presenting for concerns of SOB following COVID. PMH includes R lower lobe adenocarcinoma (2019), COPD on 2L for nocturnal hypoxemia, DM2, PAD, HTN, HLD, iron deficiency anemia.  Clinical Impression  Pt alert, oriented x 4. Pt's PLOF is independent with SPC for ambulation. At baseline, pt utilizes 2L oxygen at night and is no room-air during the day. Pt was on 2L O2 at PT arrival with SOB with mobility.  PT is able to transfer with Mod-I utilizing RW without signs or instability or LOB. Pt ambulated 200 ft with RW w/ supervision for safety maintaining SpO2 levels > 95% on 2L via [place]. Pt education provided on RW usage for community distances and periods of SOB during mobility to decrease energy expenditure. Discharge recommendations are outpatient PT to build endurance, strength, and improve overall mobility. Skilled PT intervention is indicated to address deficits in function, mobility, and to return to PLOF as able.      Follow Up Recommendations Outpatient PT    Equipment Recommendations  Rolling walker with 5" wheels    Recommendations for Other Services       Precautions / Restrictions Precautions Precautions: Fall Restrictions Weight Bearing Restrictions: No      Mobility  Bed Mobility Overal bed mobility: Modified Independent             General bed mobility comments: Utilizes bed rails w/ HOB elevated    Transfers Overall transfer level: Modified independent Equipment used: Rolling walker (2 wheeled)             General transfer comment: Pt demonstrates good stability both with an AD and without an AD  Ambulation/Gait Ambulation/Gait assistance: Supervision Gait Distance (Feet): 200 Feet Assistive device: Rolling walker (2 wheeled) Gait Pattern/deviations: Step-through pattern     General  Gait Details: Pt ambulated on 2L O2 with SpO2 levels 95-98%, RW, supervision for safety, no rest breaks necessary  Stairs            Wheelchair Mobility    Modified Rankin (Stroke Patients Only)       Balance Overall balance assessment: Needs assistance Sitting-balance support: Feet supported;No upper extremity supported Sitting balance-Leahy Scale: Good       Standing balance-Leahy Scale: Good Standing balance comment: Pt stands without AD without signs of instability or LOB both dynamically and statically                             Pertinent Vitals/Pain Pain Assessment: No/denies pain    Home Living Family/patient expects to be discharged to:: Private residence Living Arrangements: Spouse/significant other Available Help at Discharge: Family Type of Home: House Home Access: Ramped entrance     Home Layout: One level;Other (Comment) (basement) Home Equipment: Shower seat;Bedside commode;Cane - quad      Prior Function Level of Independence: Needs assistance   Gait / Transfers Assistance Needed: Pt ambulates with a SPC at baseline  ADL's / Homemaking Assistance Needed: Pt is independent for ADLs, IADLs, drives        Hand Dominance        Extremity/Trunk Assessment   Upper Extremity Assessment Upper Extremity Assessment: Overall WFL for tasks assessed    Lower Extremity Assessment Lower Extremity Assessment: RLE deficits/detail;LLE deficits/detail RLE Deficits / Details: 4/5 hip flexion; 5/5 knee ext, knee  flex, dorsiflexion; RLE Sensation: decreased light touch (dorsal, plantar aspect toes) LLE Deficits / Details: 4/5 hip flexion; 5/5 knee ext, knee flex, dorsiflexion; LLE Sensation: decreased light touch (dorsal, plantar aspect toes)       Communication   Communication: No difficulties  Cognition Arousal/Alertness: Awake/alert Behavior During Therapy: WFL for tasks assessed/performed Overall Cognitive Status: Within Functional  Limits for tasks assessed                                        General Comments      Exercises Other Exercises Other Exercises: Pt education: on RW usage for community distances due to dyspnea to decrease energy expenditure   Assessment/Plan    PT Assessment Patient needs continued PT services  PT Problem List Decreased strength;Decreased range of motion;Decreased activity tolerance;Decreased balance;Decreased mobility;Decreased coordination       PT Treatment Interventions Balance training;Gait training;DME instruction;Stair training;Functional mobility training;Therapeutic activities;Therapeutic exercise;Neuromuscular re-education    PT Goals (Current goals can be found in the Care Plan section)  Acute Rehab PT Goals Patient Stated Goal: To go home PT Goal Formulation: With patient Time For Goal Achievement: 04/16/21 Potential to Achieve Goals: Good    Frequency Min 2X/week   Barriers to discharge        Co-evaluation               AM-PAC PT "6 Clicks" Mobility  Outcome Measure Help needed turning from your back to your side while in a flat bed without using bedrails?: None Help needed moving from lying on your back to sitting on the side of a flat bed without using bedrails?: None Help needed moving to and from a bed to a chair (including a wheelchair)?: None Help needed standing up from a chair using your arms (e.g., wheelchair or bedside chair)?: None Help needed to walk in hospital room?: A Little Help needed climbing 3-5 steps with a railing? : A Little 6 Click Score: 22    End of Session Equipment Utilized During Treatment: Gait belt Activity Tolerance: Patient tolerated treatment well Patient left: in chair;with call bell/phone within reach;with chair alarm set   PT Visit Diagnosis: Other abnormalities of gait and mobility (R26.89);Difficulty in walking, not elsewhere classified (R26.2);Muscle weakness (generalized) (M62.81)     Time: 0923-3007 PT Time Calculation (min) (ACUTE ONLY): 29 min   Charges:             [employer], SPT

## 2021-04-02 NOTE — Progress Notes (Signed)
[facility] PROGRESS NOTE    Angela Jensen  PYP:[id number] DOB: 1955/11/16 DOA: 03/31/2021 PCP: [name], MD      Brief Narrative:  Mrs. Angela Jensen is a 65 y.o. 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  Patient diagnosed with COVID 3 weeks ago with a self test at home after family members became sick.  PCP put her on Trelegy, but her shortness of breath and wheezing got worse.  In the ER, patient desaturated to 70% ambulating to the commode.  CT angiogram of the chest showed no PE, or pneumonia.  High-sensitivity troponin, minimally elevated, no chest pain.  Started on IV Solu-Medrol and bronchodilators and hospitalist were called to admit    Assessment & Plan:  COPD exacerbation with acute on chronic hypoxic respiratory failure Patient presented with dyspnea, tachypnea, and hypoxia to 70% with ambulation. --started on IV solumedrol  Plan: --transition to prednisone 40 mg daily --schedule DuoNeb --Continue supplemental O2 to keep sats between 88-92%, wean as tolerated  Recent COVID Resolved, no pneumonia on CXR, no indication for COIVD specific treatments  Peripheral vascular disease --cont plavix  Hypertension BP normal --cont home Lisinopril and HCTZ  Type 2 diabetes Hyperglycemia exacerbated by steroid Glucose is somewhat elevated on steroids.  Not on insulin at home. --cont glargine 10u daily --SSI  Neuropathy --cont gabapentin  History of lung cancer CT chest without notable change -Follow-up with established oncology   Anemia of chronic disease Hgb stable, no bleeding   DVT prophylaxis: Lovenox SQ Code Status: Full code  Family Communication: female family/friend updated at the bedside today Status is: inpatient Dispo:   The patient is from: home Anticipated d/c is to: home Anticipated d/c date is: 1-2  days Patient currently is not medically stable to d/c due to: still has significant DOE and on increased O2   Subjective: Reported breathing better.  Good oral intake.   Objective: Vitals:   04/02/21 0742 04/02/21 0824 04/02/21 1128 04/02/21 1529  BP:  115/81 121/83 106/67  Pulse:  97 94 (!) 101  Resp:  18 15 15   Temp:  (!) 97.5 F (36.4 C) 98.4 F (36.9 C) 98.5 F (36.9 C)  TempSrc:  Oral    SpO2: 96% 99% 99% 97%  Weight:      Height:        Intake/Output Summary (Last 24 hours) at 04/02/2021 1858 Last data filed at 04/02/2021 1846 Gross per 24 hour  Intake 600 ml  Output --  Net 600 ml   Filed Weights   03/31/21 1441 04/01/21 0003  Weight: 50.8 kg 47.3 kg    Examination: Constitutional: NAD, AAOx3, sitting up in recliner HEENT: conjunctivae and lids normal, EOMI CV: No cyanosis.   RESP: normal respiratory effort, no wheezing, faint lung sounds Extremities: No effusions, edema in BLE SKIN: warm, dry Neuro: II - XII grossly intact.   Psych: Normal mood and affect.  Appropriate judgement and reason   Data Reviewed: I have personally reviewed following labs and imaging studies:  CBC: Recent Labs  Lab 03/31/21 1444  WBC 4.5  HGB 10.6*  HCT 33.5*  MCV 94.1  PLT 245   Basic Metabolic Panel: Recent Labs  Lab 03/31/21 1444 04/01/21 0023  NA 136 135  K 3.8 3.5  CL 100 100  CO2 29 30  GLUCOSE 236* 234*  BUN 12 10  CREATININE 0.57 0.61  CALCIUM 9.0 8.8*   GFR: Estimated Creatinine Clearance: 47.8 mL/min (by C-G formula based on SCr of 0.61 mg/dL). Liver Function Tests: No results for input(s): AST, ALT, ALKPHOS, BILITOT, PROT, ALBUMIN in the last 168 hours. No results for input(s): LIPASE, AMYLASE in the last 168 hours. No results for input(s): AMMONIA in the last 168 hours. Coagulation Profile: No results for input(s): INR, PROTIME in the last 168 hours. Cardiac Enzymes: No results for input(s): CKTOTAL, CKMB, CKMBINDEX, TROPONINI in the last 168  hours. BNP (last 3 results) No results for input(s): PROBNP in the last 8760 hours. HbA1C: Recent Labs    04/02/21 0345  HGBA1C 8.2*   CBG: Recent Labs  Lab 04/01/21 1644 04/01/21 2041 04/02/21 0808 04/02/21 1107 04/02/21 1648  GLUCAP 174* 291* 184* 305* 317*   Lipid Profile: No results for input(s): CHOL, HDL, LDLCALC, TRIG, CHOLHDL, LDLDIRECT in the last 72 hours. Thyroid Function Tests: No results for input(s): TSH, T4TOTAL, FREET4, T3FREE, THYROIDAB in the last 72 hours. Anemia Panel: No results for input(s): VITAMINB12, FOLATE, FERRITIN, TIBC, IRON, RETICCTPCT in the last 72 hours. Urine analysis:    Component Value Date/Time   COLORURINE YELLOW (A) 06/30/2020 1325   APPEARANCEUR Clear 07/29/2020 1137   LABSPEC 1.023 06/30/2020 1325   PHURINE 5.0 06/30/2020 1325   GLUCOSEU Negative 07/29/2020 1137   HGBUR NEGATIVE 06/30/2020 1325   BILIRUBINUR Negative 07/29/2020 1137   KETONESUR 5 (A) 06/30/2020 1325   PROTEINUR Negative 07/29/2020 1137   PROTEINUR NEGATIVE 06/30/2020 1325   NITRITE Negative 07/29/2020 1137   NITRITE NEGATIVE 06/30/2020 1325   LEUKOCYTESUR Negative 07/29/2020 1137   LEUKOCYTESUR NEGATIVE 06/30/2020 1325   Sepsis Labs: @LABRCNTIP (procalcitonin:4,lacticacidven:4)  ) Recent Results (from the past 240 hour(s))  Resp Panel by RT-PCR (Flu A&B, Covid) Nasopharyngeal Swab     Status: None   Collection Time: 03/31/21  9:38 PM   Specimen: Nasopharyngeal Swab; Nasopharyngeal(NP) swabs in vial transport medium  Result Value Ref Range Status   SARS Coronavirus 2 by RT PCR NEGATIVE NEGATIVE Final    Comment: (NOTE) SARS-CoV-2 target nucleic acids are NOT DETECTED.  The SARS-CoV-2 RNA is generally detectable in upper respiratory specimens during the acute phase of infection. The lowest concentration of SARS-CoV-2 viral copies this assay can detect is 138 copies/mL. A negative result does not preclude SARS-Cov-2 infection and should not be used as  the sole basis for treatment or other patient management decisions. A negative result may occur with  improper specimen collection/handling, submission of specimen other than nasopharyngeal swab, presence of viral mutation(s) within the areas targeted by this assay, and inadequate number of viral copies(<138 copies/mL). A negative result must be combined with clinical observations, patient history, and epidemiological information. The expected result is Negative.  Fact Sheet for Patients:  EntrepreneurPulse.com.au  Fact Sheet for Healthcare Providers:  IncredibleEmployment.be  This test is no t yet approved or cleared by the [place] FDA and  has been authorized for detection and/or diagnosis of SARS-CoV-2 by FDA under an Emergency Use Authorization (EUA). This EUA will remain  in effect (meaning this test can be used) for the duration of the COVID-19 declaration under Section 564(b)(1) of the Act, 21 U.S.C.section 360bbb-3(b)(1), unless the authorization is terminated  or revoked sooner.       Influenza A by PCR NEGATIVE NEGATIVE Final   Influenza B by PCR NEGATIVE NEGATIVE Final    Comment: (NOTE) The Xpert Xpress SARS-CoV-2/FLU/RSV plus assay  is intended as an aid in the diagnosis of influenza from Nasopharyngeal swab specimens and should not be used as a sole basis for treatment. Nasal washings and aspirates are unacceptable for Xpert Xpress SARS-CoV-2/FLU/RSV testing.  Fact Sheet for Patients: EntrepreneurPulse.com.au  Fact Sheet for Healthcare Providers: IncredibleEmployment.be  This test is not yet approved or cleared by the [place] FDA and has been authorized for detection and/or diagnosis of SARS-CoV-2 by FDA under an Emergency Use Authorization (EUA). This EUA will remain in effect (meaning this test can be used) for the duration of the COVID-19 declaration under Section 564(b)(1) of  the Act, 21 U.S.C. section 360bbb-3(b)(1), unless the authorization is terminated or revoked.  Performed at [facility], [address] [place], [place] [address]          Radiology Studies: CT Angio Chest PE W and/or Wo Contrast  Result Date: 03/31/2021 CLINICAL DATA:  PE suspected, high prob Shortness of breath since having COVID 3 weeks ago. EXAM: CT ANGIOGRAPHY CHEST WITH CONTRAST TECHNIQUE: Multidetector CT imaging of the chest was performed using the standard protocol during bolus administration of intravenous contrast. Multiplanar CT image reconstructions and MIPs were obtained to evaluate the vascular anatomy. CONTRAST:  22mL OMNIPAQUE IOHEXOL 350 MG/ML SOLN COMPARISON:  Radiograph earlier today. Noncontrast chest CT 03/11/2021. Chest CT 04/11/2020 FINDINGS: Cardiovascular: There are no filling defects within the pulmonary arteries to suggest pulmonary embolus. Atherosclerosis of the thoracic aorta. No aneurysm. Heart is normal in size. There is contrast refluxing into the hepatic veins and IVC. No pericardial effusion. Mediastinum/Nodes: No mediastinal adenopathy. No hilar adenopathy. No esophageal wall thickening. No thyroid nodule. Lungs/Pleura: Breathing motion artifact limits detailed assessment. Moderately advanced emphysema. Bandlike scarring at the right lung apex likely post treatment related, stable in appearance from prior exam. Streaky and bandlike opacities in the right middle lobe and right lung base, also stable in appearance from prior exam and likely representing post treatment related change. No evidence of acute airspace disease. 7 mm left upper lobe pulmonary nodule, series 6, image 25, is unchanged from recent prior. No new pulmonary nodules, with decreased sensitivity due to motion artifact. Upper Abdomen: Atherosclerosis of the upper abdominal aorta. Ingested material within the stomach. Musculoskeletal: Unchanged appearance of chronic right lateral seventh  rib fracture, overlying post treatment related change. No acute osseous abnormalities are seen Review of the MIP images confirms the above findings. IMPRESSION: 1. No pulmonary embolus. 2. Contrast refluxing into the hepatic veins and IVC, suggesting elevated right heart pressures. 3. No other acute intrathoracic findings. No focal airspace disease. 4. Stable treatment related changes at the right lung apex, right middle lobe and right lung base. 5. Unchanged 7 mm left upper lobe pulmonary nodule over the past 3 weeks, continued CT follow-up is recommended as recommended on previous staging exam. Aortic Atherosclerosis (ICD10-I70.0) and Emphysema (ICD10-J43.9). Electronically Signed   By: Keith Rake M.D.   On: 03/31/2021 22:32        Scheduled Meds:  clopidogrel  75 mg Oral Daily   enoxaparin (LOVENOX) injection  40 mg Subcutaneous Q2200   ferrous sulfate  325 mg Oral BID WC   fluticasone furoate-vilanterol  1 puff Inhalation Daily   gabapentin  100 mg Oral BID   lisinopril  10 mg Oral Daily   And   hydrochlorothiazide  12.5 mg Oral Daily   insulin aspart  0-20 Units Subcutaneous TID WC   insulin aspart  0-5 Units Subcutaneous QHS   insulin glargine-yfgn  10 Units Subcutaneous Daily   ipratropium-albuterol  3 mL Nebulization TID   meloxicam  7.5 mg Oral BID   potassium chloride  10 mEq Oral QODAY   predniSONE  40 mg Oral Q breakfast   umeclidinium bromide  1 puff Inhalation Daily   Continuous Infusions:   LOS: 2 days    Enzo Bi, MD [facility] 04/02/2021, 6:58 PM     Please page though [place] or Epic secure chat:  For [employer], Adult nurse

## 2021-04-02 NOTE — Plan of Care (Signed)
Patient sleeping between care. Oriented x4. No new changes in assessment. Oxygen on at 2L [place]. Call bell within reach.  PLAN OF CARE ONGOING Problem: Education: Goal: Knowledge of General Education information will improve Description: Including pain rating scale, medication(s)/side effects and non-pharmacologic comfort measures Outcome: Progressing   Problem: Health Behavior/Discharge Planning: Goal: Ability to manage health-related needs will improve Outcome: Progressing   Problem: Clinical Measurements: Goal: Ability to maintain clinical measurements within normal limits will improve Outcome: Progressing Goal: Will remain free from infection Outcome: Progressing Goal: Diagnostic test results will improve Outcome: Progressing Goal: Respiratory complications will improve Outcome: Progressing Goal: Cardiovascular complication will be avoided Outcome: Progressing   Problem: Activity: Goal: Risk for activity intolerance will decrease Outcome: Progressing   Problem: Nutrition: Goal: Adequate nutrition will be maintained Outcome: Progressing   Problem: Coping: Goal: Level of anxiety will decrease Outcome: Progressing   Problem: Elimination: Goal: Will not experience complications related to bowel motility Outcome: Progressing Goal: Will not experience complications related to urinary retention Outcome: Progressing   Problem: Pain Managment: Goal: General experience of comfort will improve Outcome: Progressing   Problem: Safety: Goal: Ability to remain free from injury will improve Outcome: Progressing   Problem: Skin Integrity: Goal: Risk for impaired skin integrity will decrease Outcome: Progressing

## 2021-04-03 ENCOUNTER — Inpatient Hospital Stay (HOSPITAL_COMMUNITY)
Admit: 2021-04-03 | Discharge: 2021-04-03 | Disposition: A | Discharge status: Home / Self Care | Payer: Medicare Other | Attending: Family Medicine | Admitting: Family Medicine

## 2021-04-03 DIAGNOSIS — R0609 Other forms of dyspnea: Secondary | ICD-10-CM | POA: Diagnosis not present

## 2021-04-03 LAB — CBC
HCT: 32 % — ABNORMAL LOW (ref 36.0–46.0)
Hemoglobin: 10.3 g/dL — ABNORMAL LOW (ref 12.0–15.0)
MCH: 29.7 pg (ref 26.0–34.0)
MCHC: 32.2 g/dL (ref 30.0–36.0)
MCV: 92.2 fL (ref 80.0–100.0)
Platelets: 261 10*3/uL (ref 150–400)
RBC: 3.47 MIL/uL — ABNORMAL LOW (ref 3.87–5.11)
RDW: 13.5 % (ref 11.5–15.5)
WBC: 7.8 10*3/uL (ref 4.0–10.5)
nRBC: 0 % (ref 0.0–0.2)

## 2021-04-03 LAB — BASIC METABOLIC PANEL
Anion gap: 11 (ref 5–15)
BUN: 20 mg/dL (ref 8–23)
CO2: 31 mmol/L (ref 22–32)
Calcium: 8.7 mg/dL — ABNORMAL LOW (ref 8.9–10.3)
Chloride: 93 mmol/L — ABNORMAL LOW (ref 98–111)
Creatinine, Ser: 0.58 mg/dL (ref 0.44–1.00)
GFR, Estimated: >60 mL/min (ref >60)
Glucose, Bld: 360 mg/dL — ABNORMAL HIGH (ref 70–99)
Potassium: 4.4 mmol/L (ref 3.5–5.1)
Sodium: 135 mmol/L (ref 135–145)

## 2021-04-03 LAB — GLUCOSE, CAPILLARY
Glucose-Capillary: 317 mg/dL — ABNORMAL HIGH (ref 70–99)
Glucose-Capillary: 118 mg/dL — ABNORMAL HIGH (ref 70–99)
Glucose-Capillary: 470 mg/dL — ABNORMAL HIGH (ref 70–99)
Glucose-Capillary: 519 mg/dL (ref 70–99)
Glucose-Capillary: 248 mg/dL — ABNORMAL HIGH (ref 70–99)

## 2021-04-03 LAB — ECHOCARDIOGRAM COMPLETE
Height: 59 in
S' Lateral: 2.4 cm
Weight: 1668.44 oz

## 2021-04-03 LAB — MAGNESIUM: Magnesium: 2.3 mg/dL (ref 1.7–2.4)

## 2021-04-03 MED ORDER — DOCUSATE SODIUM 100 MG PO CAPS
100.0000 mg | ORAL_CAPSULE | Freq: Two times a day (BID) | ORAL | Status: DC | PRN
  Administered 2021-04-03 – 2021-04-04 (×2): 100 mg via ORAL
  Filled 2021-04-03 (×2): qty 1

## 2021-04-03 MED ORDER — ALBUTEROL SULFATE (2.5 MG/3ML) 0.083% IN NEBU
INHALATION_SOLUTION | RESPIRATORY_TRACT | Status: AC
  Filled 2021-04-03: qty 3

## 2021-04-03 MED ORDER — CALCIUM CARBONATE ANTACID 500 MG PO CHEW: 1.0000 | CHEWABLE_TABLET | Freq: Three times a day (TID) | ORAL | Status: DC | PRN

## 2021-04-03 MED ORDER — CYCLOBENZAPRINE HCL 10 MG PO TABS
10.0000 mg | ORAL_TABLET | Freq: Every day | ORAL | Status: DC
  Administered 2021-04-03 – 2021-04-05 (×4): 10 mg via ORAL
  Filled 2021-04-03 (×4): qty 1

## 2021-04-03 MED ORDER — POLYETHYLENE GLYCOL 3350 17 G PO PACK: 17.0000 g | PACK | Freq: Two times a day (BID) | ORAL | Status: DC | PRN

## 2021-04-03 MED ORDER — ONDANSETRON 4 MG PO TBDP
4.0000 mg | ORAL_TABLET | Freq: Three times a day (TID) | ORAL | Status: DC | PRN
  Filled 2021-04-03: qty 1

## 2021-04-03 MED ORDER — ALUM & MAG HYDROXIDE-SIMETH 200-200-20 MG/5ML PO SUSP: 15.0000 mL | Freq: Four times a day (QID) | ORAL | Status: DC | PRN

## 2021-04-03 NOTE — Plan of Care (Signed)

## 2021-04-03 NOTE — Progress Notes (Signed)
*  PRELIMINARY RESULTS* Echocardiogram 2D Echocardiogram has been performed.  Sherrie Sport 04/03/2021, 7:57 AM

## 2021-04-03 NOTE — Progress Notes (Addendum)
Inpatient Diabetes Program Recommendations  AACE/ADA: New Consensus Statement on Inpatient Glycemic Control (2015)  Target Ranges:  Prepandial:   less than 140 mg/dL      Peak postprandial:   less than 180 mg/dL (1-2 hours)      Critically ill patients:  140 - 180 mg/dL   Lab Results  Component Value Date   GLUCAP 317 (H) 04/03/2021   HGBA1C 8.2 (H) 04/02/2021    Review of Glycemic Control Results for Angela Jensen, Angela Jensen (MRN [id number]) as of 04/03/2021 10:55  Ref. Range 04/02/2021 08:08 04/02/2021 11:07 04/02/2021 16:48 04/02/2021 21:28 04/03/2021 07:32  Glucose-Capillary Latest Ref Range: 70 - 99 mg/dL 184 (H) 305 (H) 317 (H) 324 (H) 317 (H)   Diabetes history: DM 2 Outpatient Diabetes medications: Metformin 500 mg daily Current orders for Inpatient glycemic control:  Semglee 10 units daily, Novolog 0-20 units tid with meals and HS, Prednisone 40 mg daily  Inpatient Diabetes Program Recommendations:    Please increase Semglee to 15 units daily.  Also consider adding Novolog 3 units tid with meals while on steroids.   A1C indicates need for adjustment in oral DM medications as well.    Thanks,  Adah Perl, RN, BC-ADM Inpatient Diabetes Coordinator Pager [phone]  (8a-5p)

## 2021-04-03 NOTE — Progress Notes (Signed)
[facility] PROGRESS NOTE    Angela Jensen  NTZ:[id number] DOB: 12-Sep-1955 DOA: 03/31/2021 PCP: [name], MD      Brief Narrative:  Angela Jensen is a 65 y.o. 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  Patient diagnosed with COVID 3 weeks ago with a self test at home after family members became sick.  PCP put her on Trelegy, but her shortness of breath and wheezing got worse.  In the ER, patient desaturated to 70% ambulating to the commode.  CT angiogram of the chest showed no PE, or pneumonia.  High-sensitivity troponin, minimally elevated, no chest pain.  Started on IV Solu-Medrol and bronchodilators and hospitalist were called to admit    Assessment & Plan:  COPD exacerbation with acute on chronic hypoxic respiratory failure Patient presented with dyspnea, tachypnea, and hypoxia to 70% with ambulation. --started on IV solumedrol  Plan: --cont prednisone 40 mg daily --schedule DuoNeb --Continue supplemental O2 to keep sats between 88-92%, wean as tolerated --walk test  Recent COVID Resolved, no pneumonia on CXR, no indication for COIVD specific treatments  Peripheral vascular disease --cont plavix  Hypertension BP normal --cont home Lisinopril and HCTZ  Type 2 diabetes Hyperglycemia exacerbated by steroid Glucose is somewhat elevated on steroids.  Not on insulin at home. --increase glargine to 15u daily --add mealtime 3u TID --SSI  Neuropathy --cont gabapentin  Back pain --flexeril  History of lung cancer CT chest without notable change -Follow-up with established oncology   Anemia of chronic disease Hgb stable, no bleeding   DVT prophylaxis: Lovenox SQ Code Status: Full code  Family Communication:  Status is: inpatient Dispo:   The patient is from: home Anticipated d/c is to: home Anticipated d/c  date is: likely tomorrow Patient currently is not medically stable to d/c due to: still has significant DOE and on increased O2   Subjective: Pt reported migrating back pain.  Ate well.  Had DOE.   Objective: Vitals:   04/03/21 0506 04/03/21 0731 04/03/21 1117 04/03/21 1608  BP: 128/81 102/79 (!) 128/92 125/75  Pulse: 90 88 95 (!) 101  Resp: 18 15 15 15   Temp: 97.7 F (36.5 C) 98.2 F (36.8 C) 98.5 F (36.9 C) 98.2 F (36.8 C)  TempSrc: Oral     SpO2: 97% 97% 99% 97%  Weight:      Height:        Intake/Output Summary (Last 24 hours) at 04/03/2021 1813 Last data filed at 04/03/2021 1407 Gross per 24 hour  Intake 1200 ml  Output --  Net 1200 ml   Filed Weights   03/31/21 1441 04/01/21 0003  Weight: 50.8 kg 47.3 kg    Examination: Constitutional: NAD, AAOx3 HEENT: conjunctivae and lids normal, EOMI CV: No cyanosis.   RESP: normal respiratory effort, on 2L Extremities: No effusions, edema in BLE SKIN: warm, dry Neuro: II - XII grossly intact.   Psych: Normal mood and affect.  Appropriate judgement and reason   Data Reviewed: I have personally reviewed following labs and imaging studies:  CBC: Recent Labs  Lab 03/31/21 1444 04/03/21 0423  WBC 4.5 7.8  HGB 10.6* 10.3*  HCT 33.5* 32.0*  MCV 94.1 92.2  PLT 239 675   Basic Metabolic Panel: Recent Labs  Lab 03/31/21 1444 04/01/21 0023 04/03/21 0423  NA 136 135 135  K  3.8 3.5 4.4  CL 100 100 93*  CO2 29 30 31   GLUCOSE 236* 234* 360*  BUN 12 10 20   CREATININE 0.57 0.61 0.58  CALCIUM 9.0 8.8* 8.7*  MG  --   --  2.3   GFR: Estimated Creatinine Clearance: 47.8 mL/min (by C-G formula based on SCr of 0.58 mg/dL). Liver Function Tests: No results for input(s): AST, ALT, ALKPHOS, BILITOT, PROT, ALBUMIN in the last 168 hours. No results for input(s): LIPASE, AMYLASE in the last 168 hours. No results for input(s): AMMONIA in the last 168 hours. Coagulation Profile: No results for input(s): INR, PROTIME in  the last 168 hours. Cardiac Enzymes: No results for input(s): CKTOTAL, CKMB, CKMBINDEX, TROPONINI in the last 168 hours. BNP (last 3 results) No results for input(s): PROBNP in the last 8760 hours. HbA1C: Recent Labs    04/02/21 0345  HGBA1C 8.2*   CBG: Recent Labs  Lab 04/02/21 2128 04/03/21 0732 04/03/21 1116 04/03/21 1637 04/03/21 1721  GLUCAP 324* 317* 118* 519* 470*   Lipid Profile: No results for input(s): CHOL, HDL, LDLCALC, TRIG, CHOLHDL, LDLDIRECT in the last 72 hours. Thyroid Function Tests: No results for input(s): TSH, T4TOTAL, FREET4, T3FREE, THYROIDAB in the last 72 hours. Anemia Panel: No results for input(s): VITAMINB12, FOLATE, FERRITIN, TIBC, IRON, RETICCTPCT in the last 72 hours. Urine analysis:    Component Value Date/Time   COLORURINE YELLOW (A) 06/30/2020 1325   APPEARANCEUR Clear 07/29/2020 1137   LABSPEC 1.023 06/30/2020 1325   PHURINE 5.0 06/30/2020 1325   GLUCOSEU Negative 07/29/2020 1137   HGBUR NEGATIVE 06/30/2020 1325   BILIRUBINUR Negative 07/29/2020 1137   KETONESUR 5 (A) 06/30/2020 1325   PROTEINUR Negative 07/29/2020 1137   PROTEINUR NEGATIVE 06/30/2020 1325   NITRITE Negative 07/29/2020 1137   NITRITE NEGATIVE 06/30/2020 1325   LEUKOCYTESUR Negative 07/29/2020 1137   LEUKOCYTESUR NEGATIVE 06/30/2020 1325   Sepsis Labs: @LABRCNTIP (procalcitonin:4,lacticacidven:4)  ) Recent Results (from the past 240 hour(s))  Resp Panel by RT-PCR (Flu A&B, Covid) Nasopharyngeal Swab     Status: None   Collection Time: 03/31/21  9:38 PM   Specimen: Nasopharyngeal Swab; Nasopharyngeal(NP) swabs in vial transport medium  Result Value Ref Range Status   SARS Coronavirus 2 by RT PCR NEGATIVE NEGATIVE Final    Comment: (NOTE) SARS-CoV-2 target nucleic acids are NOT DETECTED.  The SARS-CoV-2 RNA is generally detectable in upper respiratory specimens during the acute phase of infection. The lowest concentration of SARS-CoV-2 viral copies this assay  can detect is 138 copies/mL. A negative result does not preclude SARS-Cov-2 infection and should not be used as the sole basis for treatment or other patient management decisions. A negative result may occur with  improper specimen collection/handling, submission of specimen other than nasopharyngeal swab, presence of viral mutation(s) within the areas targeted by this assay, and inadequate number of viral copies(<138 copies/mL). A negative result must be combined with clinical observations, patient history, and epidemiological information. The expected result is Negative.  Fact Sheet for Patients:  EntrepreneurPulse.com.au  Fact Sheet for Healthcare Providers:  IncredibleEmployment.be  This test is no t yet approved or cleared by the [place] FDA and  has been authorized for detection and/or diagnosis of SARS-CoV-2 by FDA under an Emergency Use Authorization (EUA). This EUA will remain  in effect (meaning this test can be used) for the duration of the COVID-19 declaration under Section 564(b)(1) of the Act, 21 U.S.C.section 360bbb-3(b)(1), unless the authorization is terminated  or revoked sooner.  Influenza A by PCR NEGATIVE NEGATIVE Final   Influenza B by PCR NEGATIVE NEGATIVE Final    Comment: (NOTE) The Xpert Xpress SARS-CoV-2/FLU/RSV plus assay is intended as an aid in the diagnosis of influenza from Nasopharyngeal swab specimens and should not be used as a sole basis for treatment. Nasal washings and aspirates are unacceptable for Xpert Xpress SARS-CoV-2/FLU/RSV testing.  Fact Sheet for Patients: EntrepreneurPulse.com.au  Fact Sheet for Healthcare Providers: IncredibleEmployment.be  This test is not yet approved or cleared by the [place] FDA and has been authorized for detection and/or diagnosis of SARS-CoV-2 by FDA under an Emergency Use Authorization (EUA). This EUA will  remain in effect (meaning this test can be used) for the duration of the COVID-19 declaration under Section 564(b)(1) of the Act, 21 U.S.C. section 360bbb-3(b)(1), unless the authorization is terminated or revoked.  Performed at [facility], [address] [place], [place] [address]          Radiology Studies: ECHOCARDIOGRAM COMPLETE  Result Date: 04/03/2021    ECHOCARDIOGRAM REPORT   Patient Name:   Angela Jensen [facility] Date of Exam: 04/03/2021 Medical Rec #:  [id number]    Height:       59.0 in Accession #:    [id number]   Weight:       104.3 lb Date of Birth:  Sep 19, 1955    BSA:          1.398 m Patient Age:    17 years     BP:           102/79 mmHg Patient Gender: F            HR:           88 bpm. Exam Location:  [facility] Procedure: 2D Echo, Cardiac Doppler and Color Doppler Indications:     Dyspnea R06.00  History:         Patient has prior history of Echocardiogram examinations, most                  recent 08/09/2017. COPD, Signs/Symptoms:Shortness of Breath;                  Risk Factors:Hypertension.  Sonographer:     Sherrie Sport Referring Phys:  [id number] CHRISTOPHER P DANFORD Diagnosing Phys: Ida Rogue MD  Sonographer Comments: Technically challenging study due to limited acoustic windows and no apical window. Image acquisition challenging due to COPD and Best image view was subcostal. IMPRESSIONS  1. Challenging images  2. Left ventricular ejection fraction, by estimation, is 45 to 50%. The left ventricle has mildly decreased function. The left ventricle demonstrates global hypokinesis. Left ventricular diastolic parameters are indeterminate.  3. Right ventricular systolic function is mildly reduced. The right ventricular size is normal. FINDINGS  Left Ventricle: Left ventricular ejection fraction, by estimation, is 45 to 50%. The left ventricle has mildly decreased function. The left ventricle demonstrates global hypokinesis. The left ventricular internal cavity size was normal in  size. There is  no left ventricular hypertrophy. Left ventricular diastolic parameters are indeterminate. Right Ventricle: The right ventricular size is normal. No increase in right ventricular wall thickness. Right ventricular systolic function is mildly reduced. Left Atrium: Left atrial size was normal in size. Right Atrium: Right atrial size was normal in size. Pericardium: There is no evidence of pericardial effusion. Mitral Valve: The mitral valve was not well visualized. No evidence of mitral valve regurgitation. No evidence of mitral valve stenosis. Tricuspid Valve: The tricuspid valve  is not well visualized. Tricuspid valve regurgitation is not demonstrated. No evidence of tricuspid stenosis. Aortic Valve: The aortic valve was not well visualized. Aortic valve regurgitation is not visualized. Mild to moderate aortic valve sclerosis/calcification is present, without any evidence of aortic stenosis. Pulmonic Valve: The pulmonic valve was not well visualized. Pulmonic valve regurgitation is not visualized. No evidence of pulmonic stenosis. There is no evidence of pulmonic valve vegetation. Aorta: The aortic root is normal in size and structure. Venous: The pulmonary veins were not well visualized. The inferior vena cava is normal in size with greater than 50% respiratory variability, suggesting right atrial pressure of 3 mmHg. IAS/Shunts: No atrial level shunt detected by color flow Doppler.  LEFT VENTRICLE PLAX 2D LVIDd:         3.28 cm LVIDs:         2.40 cm LV PW:         0.89 cm LV IVS:        1.18 cm LVOT diam:     2.00 cm LVOT Area:     3.14 cm  LEFT ATRIUM         Index LA diam:    2.70 cm 1.93 cm/m                        PULMONIC VALVE AORTA                 PV Vmax:       0.28 m/s Ao Root diam: 2.70 cm PV Peak grad:  0.3 mmHg   SHUNTS Systemic Diam: 2.00 cm Ida Rogue MD Electronically signed by Ida Rogue MD Signature Date/Time: 04/03/2021/3:00:00 PM    Final         Scheduled Meds:   albuterol       clopidogrel  75 mg Oral Daily   enoxaparin (LOVENOX) injection  40 mg Subcutaneous Q2200   ferrous sulfate  325 mg Oral BID WC   fluticasone furoate-vilanterol  1 puff Inhalation Daily   gabapentin  100 mg Oral BID   lisinopril  10 mg Oral Daily   And   hydrochlorothiazide  12.5 mg Oral Daily   insulin aspart  0-20 Units Subcutaneous TID WC   insulin aspart  0-5 Units Subcutaneous QHS   insulin glargine-yfgn  10 Units Subcutaneous Daily   ipratropium-albuterol  3 mL Nebulization TID   meloxicam  7.5 mg Oral BID   potassium chloride  10 mEq Oral QODAY   predniSONE  40 mg Oral Q breakfast   umeclidinium bromide  1 puff Inhalation Daily   Continuous Infusions:   LOS: 3 days    Enzo Bi, MD [facility] 04/03/2021, 6:13 PM

## 2021-04-03 NOTE — Progress Notes (Signed)
BS 519, msg'd MD who advised to give top scale which is 20 units. 20 units administered, patient is not exhibiting any other signs or symptoms and only has complaint of pain along ribs, similar to this morning.

## 2021-04-04 LAB — CBC
HCT: 30.9 % — ABNORMAL LOW (ref 36.0–46.0)
Hemoglobin: 9.7 g/dL — ABNORMAL LOW (ref 12.0–15.0)
MCH: 28.6 pg (ref 26.0–34.0)
MCHC: 31.4 g/dL (ref 30.0–36.0)
MCV: 91.2 fL (ref 80.0–100.0)
Platelets: 261 10*3/uL (ref 150–400)
RBC: 3.39 MIL/uL — ABNORMAL LOW (ref 3.87–5.11)
RDW: 13.7 % (ref 11.5–15.5)
WBC: 9 10*3/uL (ref 4.0–10.5)
nRBC: 0 % (ref 0.0–0.2)

## 2021-04-04 LAB — GLUCOSE, CAPILLARY
Glucose-Capillary: 177 mg/dL — ABNORMAL HIGH (ref 70–99)
Glucose-Capillary: 351 mg/dL — ABNORMAL HIGH (ref 70–99)
Glucose-Capillary: 356 mg/dL — ABNORMAL HIGH (ref 70–99)
Glucose-Capillary: 305 mg/dL — ABNORMAL HIGH (ref 70–99)

## 2021-04-04 LAB — BASIC METABOLIC PANEL
Anion gap: 6 (ref 5–15)
BUN: 26 mg/dL — ABNORMAL HIGH (ref 8–23)
CO2: 30 mmol/L (ref 22–32)
Calcium: 8.3 mg/dL — ABNORMAL LOW (ref 8.9–10.3)
Chloride: 99 mmol/L (ref 98–111)
Creatinine, Ser: 0.77 mg/dL (ref 0.44–1.00)
GFR, Estimated: >60 mL/min (ref >60)
Glucose, Bld: 220 mg/dL — ABNORMAL HIGH (ref 70–99)
Potassium: 3.7 mmol/L (ref 3.5–5.1)
Sodium: 135 mmol/L (ref 135–145)

## 2021-04-04 LAB — MAGNESIUM: Magnesium: 2.3 mg/dL (ref 1.7–2.4)

## 2021-04-04 MED ORDER — MELOXICAM 7.5 MG PO TABS: 7.5000 mg | ORAL_TABLET | Freq: Two times a day (BID) | ORAL | Status: DC | PRN

## 2021-04-04 MED ORDER — INSULIN GLARGINE-YFGN 100 UNIT/ML ~~LOC~~ SOLN
15.0000 [IU] | Freq: Every day | SUBCUTANEOUS | Status: DC
  Administered 2021-04-04 – 2021-04-05 (×2): 15 [IU] via SUBCUTANEOUS
  Filled 2021-04-04 (×2): qty 0.15

## 2021-04-04 MED ORDER — INSULIN ASPART 100 UNIT/ML IJ SOLN
3.0000 [IU] | Freq: Three times a day (TID) | INTRAMUSCULAR | Status: DC
  Administered 2021-04-04 – 2021-04-05 (×5): 3 [IU] via SUBCUTANEOUS
  Filled 2021-04-04 (×5): qty 1

## 2021-04-04 NOTE — Plan of Care (Signed)
No acute events overnight. Pt continues to c/o pain on rt side. She states the flexeril is not strong enough. Problem: Education: Goal: Knowledge of General Education information will improve Description: Including pain rating scale, medication(s)/side effects and non-pharmacologic comfort measures Outcome: Progressing   Problem: Health Behavior/Discharge Planning: Goal: Ability to manage health-related needs will improve Outcome: Progressing   Problem: Clinical Measurements: Goal: Ability to maintain clinical measurements within normal limits will improve Outcome: Progressing Goal: Will remain free from infection Outcome: Progressing Goal: Diagnostic test results will improve Outcome: Progressing Goal: Respiratory complications will improve Outcome: Progressing Goal: Cardiovascular complication will be avoided Outcome: Progressing   Problem: Activity: Goal: Risk for activity intolerance will decrease Outcome: Progressing   Problem: Nutrition: Goal: Adequate nutrition will be maintained Outcome: Progressing   Problem: Coping: Goal: Level of anxiety will decrease Outcome: Progressing   Problem: Elimination: Goal: Will not experience complications related to bowel motility Outcome: Progressing Goal: Will not experience complications related to urinary retention Outcome: Progressing   Problem: Pain Managment: Goal: General experience of comfort will improve Outcome: Progressing   Problem: Safety: Goal: Ability to remain free from injury will improve Outcome: Progressing   Problem: Skin Integrity: Goal: Risk for impaired skin integrity will decrease Outcome: Progressing

## 2021-04-04 NOTE — Progress Notes (Signed)
[facility] PROGRESS NOTE    Angela Jensen  YQM:[id number] DOB: Jan 11, 1956 DOA: 03/31/2021 PCP: [name], MD      Brief Narrative:  Angela Jensen is a 65 y.o. 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  Patient diagnosed with COVID 3 weeks ago with a self test at home after family members became sick.  PCP put her on Trelegy, but her shortness of breath and wheezing got worse.  In the ER, patient desaturated to 70% ambulating to the commode.  CT angiogram of the chest showed no PE, or pneumonia.  High-sensitivity troponin, minimally elevated, no chest pain.  Started on IV Solu-Medrol and bronchodilators and hospitalist were called to admit    Assessment & Plan:  COPD exacerbation with acute on chronic hypoxic respiratory failure Patient presented with dyspnea, tachypnea, and hypoxia to 70% with ambulation. --started on IV solumedrol  Plan: --cont prednisone 40 mg daily --schedule DuoNeb --Continue supplemental O2 to keep sats between 88-92%, wean as tolerated --walk test  Recent COVID Resolved, no pneumonia on CXR, no indication for COIVD specific treatments  Peripheral vascular disease --cont plavix  Hypertension BP normal --cont home Lisinopril and HCTZ  Type 2 diabetes Hyperglycemia exacerbated by steroid Glucose is somewhat elevated on steroids.  Not on insulin at home. Plan: --cont glargine 15u daily --mealtime 3u TID --SSI  Neuropathy --cont gabapentin  History of lung cancer CT chest without notable change -Follow-up with established oncology   Anemia of chronic disease Hgb stable, no bleeding   DVT prophylaxis: Lovenox SQ Code Status: Full code  Family Communication:  Status is: inpatient Dispo:   The patient is from: home Anticipated d/c is to: home Anticipated d/c date is: likely  tomorrow Patient currently is not medically stable to d/c due to: still has significant DOE and on increased O2   Subjective: Pt continued to have significant DOE.  Nursing staff reported O2 desat down to 80's on 4L O2, however, later with PT, pt was able to maintain sat on 2L.  Normal oral intake, urination and BM.   Objective: Vitals:   04/04/21 0443 04/04/21 0741 04/04/21 1237 04/04/21 1555  BP: 130/85 122/81 122/75 118/66  Pulse: 89 89 93 100  Resp: 17 15 15 15   Temp: 97.8 F (36.6 C) 98.2 F (36.8 C) 98.5 F (36.9 C) 98.5 F (36.9 C)  TempSrc: Oral     SpO2: 100% 100% 100% 98%  Weight:      Height:        Intake/Output Summary (Last 24 hours) at 04/04/2021 1807 Last data filed at 04/04/2021 1419 Gross per 24 hour  Intake 480 ml  Output --  Net 480 ml   Filed Weights   03/31/21 1441 04/01/21 0003  Weight: 50.8 kg 47.3 kg    Examination: Constitutional: NAD, AAOx3 HEENT: conjunctivae and lids normal, EOMI CV: No cyanosis.   RESP: normal respiratory effort, on 2L Extremities: No effusions, edema in BLE SKIN: warm, dry Neuro: II - XII grossly intact.   Psych: Normal mood and affect.  Appropriate judgement and reason   Data Reviewed: I have personally reviewed following labs and imaging studies:  CBC: Recent Labs  Lab 03/31/21 1444 04/03/21 0423 04/04/21 0316  WBC 4.5 7.8 9.0  HGB 10.6* 10.3* 9.7*  HCT 33.5* 32.0* 30.9*  MCV 94.1 92.2 91.2  PLT 239 261 376   Basic Metabolic Panel: Recent Labs  Lab 03/31/21 1444 04/01/21 0023 04/03/21 0423 04/04/21 0316  NA 136 135 135 135  K 3.8 3.5 4.4 3.7  CL 100 100 93* 99  CO2 29 30 31 30   GLUCOSE 236* 234* 360* 220*  BUN 12 10 20  26*  CREATININE 0.57 0.61 0.58 0.77  CALCIUM 9.0 8.8* 8.7* 8.3*  MG  --   --  2.3 2.3   GFR: Estimated Creatinine Clearance: 47.8 mL/min (by C-G formula based on SCr of 0.77 mg/dL). Liver Function Tests: No results for input(s): AST, ALT, ALKPHOS, BILITOT, PROT, ALBUMIN  in the last 168 hours. No results for input(s): LIPASE, AMYLASE in the last 168 hours. No results for input(s): AMMONIA in the last 168 hours. Coagulation Profile: No results for input(s): INR, PROTIME in the last 168 hours. Cardiac Enzymes: No results for input(s): CKTOTAL, CKMB, CKMBINDEX, TROPONINI in the last 168 hours. BNP (last 3 results) No results for input(s): PROBNP in the last 8760 hours. HbA1C: Recent Labs    04/02/21 0345  HGBA1C 8.2*   CBG: Recent Labs  Lab 04/03/21 1721 04/03/21 2211 04/04/21 0742 04/04/21 1237 04/04/21 1630  GLUCAP 470* 248* 177* 351* 356*   Lipid Profile: No results for input(s): CHOL, HDL, LDLCALC, TRIG, CHOLHDL, LDLDIRECT in the last 72 hours. Thyroid Function Tests: No results for input(s): TSH, T4TOTAL, FREET4, T3FREE, THYROIDAB in the last 72 hours. Anemia Panel: No results for input(s): VITAMINB12, FOLATE, FERRITIN, TIBC, IRON, RETICCTPCT in the last 72 hours. Urine analysis:    Component Value Date/Time   COLORURINE YELLOW (A) 06/30/2020 1325   APPEARANCEUR Clear 07/29/2020 1137   LABSPEC 1.023 06/30/2020 1325   PHURINE 5.0 06/30/2020 1325   GLUCOSEU Negative 07/29/2020 1137   HGBUR NEGATIVE 06/30/2020 1325   BILIRUBINUR Negative 07/29/2020 1137   KETONESUR 5 (A) 06/30/2020 1325   PROTEINUR Negative 07/29/2020 1137   PROTEINUR NEGATIVE 06/30/2020 1325   NITRITE Negative 07/29/2020 1137   NITRITE NEGATIVE 06/30/2020 1325   LEUKOCYTESUR Negative 07/29/2020 1137   LEUKOCYTESUR NEGATIVE 06/30/2020 1325   Sepsis Labs: @LABRCNTIP (procalcitonin:4,lacticacidven:4)  ) Recent Results (from the past 240 hour(s))  Resp Panel by RT-PCR (Flu A&B, Covid) Nasopharyngeal Swab     Status: None   Collection Time: 03/31/21  9:38 PM   Specimen: Nasopharyngeal Swab; Nasopharyngeal(NP) swabs in vial transport medium  Result Value Ref Range Status   SARS Coronavirus 2 by RT PCR NEGATIVE NEGATIVE Final    Comment: (NOTE) SARS-CoV-2 target  nucleic acids are NOT DETECTED.  The SARS-CoV-2 RNA is generally detectable in upper respiratory specimens during the acute phase of infection. The lowest concentration of SARS-CoV-2 viral copies this assay can detect is 138 copies/mL. A negative result does not preclude SARS-Cov-2 infection and should not be used as the sole basis for treatment or other patient management decisions. A negative result may occur with  improper specimen collection/handling, submission of specimen other than nasopharyngeal swab, presence of viral mutation(s) within the areas targeted by this assay, and inadequate number of viral copies(<138 copies/mL). A negative result must be combined with clinical observations, patient history, and epidemiological information. The expected result is Negative.  Fact Sheet for Patients:  EntrepreneurPulse.com.au  Fact Sheet for Healthcare Providers:  IncredibleEmployment.be  This test is no t yet approved or cleared by the [place] FDA and  has been authorized for detection and/or diagnosis of SARS-CoV-2 by FDA under an Emergency Use Authorization (EUA). This EUA will  remain  in effect (meaning this test can be used) for the duration of the COVID-19 declaration under Section 564(b)(1) of the Act, 21 U.S.C.section 360bbb-3(b)(1), unless the authorization is terminated  or revoked sooner.       Influenza A by PCR NEGATIVE NEGATIVE Final   Influenza B by PCR NEGATIVE NEGATIVE Final    Comment: (NOTE) The Xpert Xpress SARS-CoV-2/FLU/RSV plus assay is intended as an aid in the diagnosis of influenza from Nasopharyngeal swab specimens and should not be used as a sole basis for treatment. Nasal washings and aspirates are unacceptable for Xpert Xpress SARS-CoV-2/FLU/RSV testing.  Fact Sheet for Patients: EntrepreneurPulse.com.au  Fact Sheet for Healthcare  Providers: IncredibleEmployment.be  This test is not yet approved or cleared by the [place] FDA and has been authorized for detection and/or diagnosis of SARS-CoV-2 by FDA under an Emergency Use Authorization (EUA). This EUA will remain in effect (meaning this test can be used) for the duration of the COVID-19 declaration under Section 564(b)(1) of the Act, 21 U.S.C. section 360bbb-3(b)(1), unless the authorization is terminated or revoked.  Performed at [facility], [address], [place], [place] [address]          Radiology Studies: ECHOCARDIOGRAM COMPLETE  Result Date: 04/03/2021    ECHOCARDIOGRAM REPORT   Patient Name:   VLASTA BASKIN [facility] Date of Exam: 04/03/2021 Medical Rec #:  [id number]    Height:       59.0 in Accession #:    [id number]   Weight:       104.3 lb Date of Birth:  Dec 16, 1955    BSA:          1.398 m Patient Age:    37 years     BP:           102/79 mmHg Patient Gender: F            HR:           88 bpm. Exam Location:  [facility] Procedure: 2D Echo, Cardiac Doppler and Color Doppler Indications:     Dyspnea R06.00  History:         Patient has prior history of Echocardiogram examinations, most                  recent 08/09/2017. COPD, Signs/Symptoms:Shortness of Breath;                  Risk Factors:Hypertension.  Sonographer:     Sherrie Sport Referring Phys:  [id number] CHRISTOPHER P DANFORD Diagnosing Phys: Ida Rogue MD  Sonographer Comments: Technically challenging study due to limited acoustic windows and no apical window. Image acquisition challenging due to COPD and Best image view was subcostal. IMPRESSIONS  1. Challenging images  2. Left ventricular ejection fraction, by estimation, is 45 to 50%. The left ventricle has mildly decreased function. The left ventricle demonstrates global hypokinesis. Left ventricular diastolic parameters are indeterminate.  3. Right ventricular systolic function is mildly reduced. The right ventricular size is  normal. FINDINGS  Left Ventricle: Left ventricular ejection fraction, by estimation, is 45 to 50%. The left ventricle has mildly decreased function. The left ventricle demonstrates global hypokinesis. The left ventricular internal cavity size was normal in size. There is  no left ventricular hypertrophy. Left ventricular diastolic parameters are indeterminate. Right Ventricle: The right ventricular size is normal. No increase in right ventricular wall thickness. Right ventricular systolic function is mildly reduced. Left Atrium: Left atrial size was normal in size. Right Atrium:  Right atrial size was normal in size. Pericardium: There is no evidence of pericardial effusion. Mitral Valve: The mitral valve was not well visualized. No evidence of mitral valve regurgitation. No evidence of mitral valve stenosis. Tricuspid Valve: The tricuspid valve is not well visualized. Tricuspid valve regurgitation is not demonstrated. No evidence of tricuspid stenosis. Aortic Valve: The aortic valve was not well visualized. Aortic valve regurgitation is not visualized. Mild to moderate aortic valve sclerosis/calcification is present, without any evidence of aortic stenosis. Pulmonic Valve: The pulmonic valve was not well visualized. Pulmonic valve regurgitation is not visualized. No evidence of pulmonic stenosis. There is no evidence of pulmonic valve vegetation. Aorta: The aortic root is normal in size and structure. Venous: The pulmonary veins were not well visualized. The inferior vena cava is normal in size with greater than 50% respiratory variability, suggesting right atrial pressure of 3 mmHg. IAS/Shunts: No atrial level shunt detected by color flow Doppler.  LEFT VENTRICLE PLAX 2D LVIDd:         3.28 cm LVIDs:         2.40 cm LV PW:         0.89 cm LV IVS:        1.18 cm LVOT diam:     2.00 cm LVOT Area:     3.14 cm  LEFT ATRIUM         Index LA diam:    2.70 cm 1.93 cm/m                        PULMONIC VALVE AORTA                  PV Vmax:       0.28 m/s Ao Root diam: 2.70 cm PV Peak grad:  0.3 mmHg   SHUNTS Systemic Diam: 2.00 cm Ida Rogue MD Electronically signed by Ida Rogue MD Signature Date/Time: 04/03/2021/3:00:00 PM    Final         Scheduled Meds:  clopidogrel  75 mg Oral Daily   cyclobenzaprine  10 mg Oral QHS   enoxaparin (LOVENOX) injection  40 mg Subcutaneous Q2200   ferrous sulfate  325 mg Oral BID WC   fluticasone furoate-vilanterol  1 puff Inhalation Daily   gabapentin  100 mg Oral BID   lisinopril  10 mg Oral Daily   And   hydrochlorothiazide  12.5 mg Oral Daily   insulin aspart  0-20 Units Subcutaneous TID WC   insulin aspart  0-5 Units Subcutaneous QHS   insulin aspart  3 Units Subcutaneous TID WC   insulin glargine-yfgn  15 Units Subcutaneous Daily   ipratropium-albuterol  3 mL Nebulization TID   meloxicam  7.5 mg Oral BID   potassium chloride  10 mEq Oral QODAY   predniSONE  40 mg Oral Q breakfast   umeclidinium bromide  1 puff Inhalation Daily   Continuous Infusions:   LOS: 4 days    Enzo Bi, MD [facility] 04/04/2021, 6:07 PM

## 2021-04-04 NOTE — Progress Notes (Signed)
Inpatient Diabetes Program Recommendations  AACE/ADA: New Consensus Statement on Inpatient Glycemic Control (2015)  Target Ranges:  Prepandial:   less than 140 mg/dL      Peak postprandial:   less than 180 mg/dL (1-2 hours)      Critically ill patients:  140 - 180 mg/dL   Lab Results  Component Value Date   GLUCAP 351 (H) 04/04/2021   HGBA1C 8.2 (H) 04/02/2021    Review of Glycemic Control Diabetes history: DM 2 Outpatient Diabetes medications: Metformin 500 mg daily Current orders for Inpatient glycemic control:  Semglee 15 units daily, Novolog 0-20 units tid with meals and HS, Novolog 3 units tid with meals, Prednisone 40 mg daily Inpatient Diabetes Program Recommendations:    Consider increasing Novolog meal coverage to 5 units tid with meals.   Thanks  Adah Perl, RN, BC-ADM Inpatient Diabetes Coordinator Pager [phone]  (8a-5p)

## 2021-04-04 NOTE — Progress Notes (Signed)
Physical Therapy Treatment Patient Details Name: Angela Jensen MRN: [id number] DOB: [birth year] Today's Date: 04/04/2021    History of Present Illness Pt is a 65 y.o. F presenting for concerns of SOB following COVID. PMH includes R lower lobe adenocarcinoma (2019), COPD on 2L for nocturnal hypoxemia, DM2, PAD, HTN, HLD, iron deficiency anemia.    PT Comments    Pt stated she has been walking to/from bathroom on her own without difficulty.  She exits and enters bed with supervision.  Stands and walks 250' with no AD then pushes O2 tank before fatigue.  No LOB but she does fatigue which limits gait.  Sats remain >95% on 2 lpm.  Stated she only used O2 at night prior to admission.  She would benefit from a RW for community ambulation for balance and energy conservation but should not need it in her home.   Follow Up Recommendations  Outpatient PT     Equipment Recommendations  Rolling walker with 5" wheels    Recommendations for Other Services       Precautions / Restrictions Precautions Precautions: Fall Restrictions Weight Bearing Restrictions: No    Mobility  Bed Mobility Overal bed mobility: Modified Independent                  Transfers Overall transfer level: Modified independent                  Ambulation/Gait Ambulation/Gait assistance: Supervision Gait Distance (Feet): 250 Feet Assistive device: None Gait Pattern/deviations: Step-through pattern Gait velocity: decreased   General Gait Details: 2 lpm with sats >95%, pushes O2 tank for 1/2 of walk until she fatigues   Stairs             Wheelchair Mobility    Modified Rankin (Stroke Patients Only)       Balance Overall balance assessment: Needs assistance Sitting-balance support: Feet supported;No upper extremity supported Sitting balance-Leahy Scale: Good     Standing balance support: No upper extremity supported Standing balance-Leahy Scale: Good                               Cognition Arousal/Alertness: Awake/alert Behavior During Therapy: WFL for tasks assessed/performed Overall Cognitive Status: Within Functional Limits for tasks assessed                                        Exercises      General Comments        Pertinent Vitals/Pain Pain Assessment: No/denies pain    Home Living                      Prior Function            PT Goals (current goals can now be found in the care plan section) Progress towards PT goals: Progressing toward goals    Frequency    Min 2X/week      PT Plan Current plan remains appropriate    Co-evaluation              AM-PAC PT "6 Clicks" Mobility   Outcome Measure  Help needed turning from your back to your side while in a flat bed without using bedrails?: None Help needed moving from lying on your back to sitting on the side of a flat bed without using  bedrails?: None Help needed moving to and from a bed to a chair (including a wheelchair)?: None Help needed standing up from a chair using your arms (e.g., wheelchair or bedside chair)?: None Help needed to walk in hospital room?: None Help needed climbing 3-5 steps with a railing? : A Little 6 Click Score: 23    End of Session Equipment Utilized During Treatment: Gait belt;Oxygen Activity Tolerance: Patient tolerated treatment well Patient left: in bed;with call bell/phone within reach Nurse Communication: Mobility status PT Visit Diagnosis: Other abnormalities of gait and mobility (R26.89);Difficulty in walking, not elsewhere classified (R26.2);Muscle weakness (generalized) (M62.81)     Time: 5909-3112 PT Time Calculation (min) (ACUTE ONLY): 9 min  Charges:  $Gait Training: 8-22 mins                    Chesley Noon, PTA 04/04/21, 1:57 PM , 1:55 PM

## 2021-04-04 NOTE — Care Management Important Message (Signed)
Important Message  Patient Details  Name: Angela Jensen MRN: [id number] Date of Birth: August 15, 1956   Medicare Important Message Given:  Yes     Dannette Barbara 04/04/2021, 11:32 AM

## 2021-04-04 NOTE — TOC Initial Note (Signed)
Transition of Care [facility]) - Initial/Assessment Note    Patient Details  Name: Angela Jensen MRN: [id number] Date of Birth: 1955-08-24  [facility] CM/SW Contact:    Alberteen Sam, LCSW Phone Number: 04/04/2021, 3:32 PM  Clinical Narrative:                  CSW spoke with patient regarding discharge planning, patient reports she does need a rolling walker at home and she has home O2 through [employer] however needs portable tank for transport.   Patient is agreeable to the recommended outpatient PT, prefers to be set up with outpatient PT in [facility].   CSW has faxed signed referral to [facility] PT at [phone].   Patient will need DME orders for rolling walker and O2 prior to dc for delivery to room.   Expected Discharge Plan: Home/Self Care Barriers to Discharge: Continued Medical Work up   Patient Goals and CMS Choice Patient states their goals for this hospitalization and ongoing recovery are:: to go home CMS Medicare.gov Compare Post Acute Care list provided to:: Patient Choice offered to / list presented to : Patient  Expected Discharge Plan and Services Expected Discharge Plan: Home/Self Care       Living arrangements for the past 2 months: Single Family Home Expected Discharge Date: 04/04/21               DME Arranged: Gilford Rile rolling, Oxygen DME Agency: [employer]                  Prior Living Arrangements/Services Living arrangements for the past 2 months: Single Family Home Lives with:: Self   Do you feel safe going back to the place where you live?: Yes               Activities of Daily Living Home Assistive Devices/Equipment: Cane (specify quad or straight), Eyeglasses, Dentures (specify type) ADL Screening (condition at time of admission) Patient's cognitive ability adequate to safely complete daily activities?: Yes Is the patient deaf or have difficulty hearing?: No Does the patient have difficulty seeing, even when wearing  glasses/contacts?: No Does the patient have difficulty concentrating, remembering, or making decisions?: No Patient able to express need for assistance with ADLs?: Yes Does the patient have difficulty dressing or bathing?: No Independently performs ADLs?: Yes (appropriate for developmental age) Does the patient have difficulty walking or climbing stairs?: No Weakness of Legs: None Weakness of Arms/Hands: None  Permission Sought/Granted                  Emotional Assessment   Attitude/Demeanor/Rapport: Gracious Affect (typically observed): Calm Orientation: : Oriented to Self, Oriented to Place, Oriented to  Time, Oriented to Situation Alcohol / Substance Use: Not Applicable Psych Involvement: No (comment)  Admission diagnosis:  COPD exacerbation ([place]) [J44.1] Acute respiratory failure with hypoxia ([place]) [J96.01] Acute on chronic respiratory failure with hypoxia ([place]) [J96.21] Patient Active Problem List   Diagnosis Date Noted   Elevated troponin 04/01/2021   Acute on chronic respiratory failure with hypoxia ([place]) 03/31/2021   Pneumothorax after biopsy 05/03/2020   Pneumothorax on right 05/01/2020   Chest pain on breathing 04/21/2020   Pleuritic chest pain 04/11/2020   Right lower lobe pulmonary nodule 04/02/2020   Iron deficiency anemia 04/02/2020   Pica in adults 04/02/2020   Goals of care, counseling/discussion 03/14/2020   Acute non-recurrent pansinusitis 01/01/2020   Pain in left wrist 09/24/2019   Encounter for general adult medical examination with  abnormal findings 06/28/2019   Elevated total protein 01/22/2019   Uncontrolled type 2 diabetes mellitus with hyperglycemia ([place]) 05/16/2018   Chronic obstructive pulmonary disease ([place]) 05/16/2018   Vitamin D deficiency 05/16/2018   Flu vaccine need 05/16/2018   Dysuria 05/16/2018   Cancer of lower lobe of right lung ([place]) 01/21/2018   Screening for breast cancer 10/12/2017   Personal history of tobacco use,  presenting hazards to health 02/03/2017   PAD (peripheral artery disease) ([place]) 06/22/2016   Essential hypertension 05/26/2016   Diabetes ([place]) 05/26/2016   Hyperlipidemia 05/26/2016   Atherosclerosis of native arteries of extremity with intermittent claudication ([place]) 05/26/2016   Uterine leiomyoma 04/30/2016   Elevated CEA 01/31/2016   Special screening for malignant neoplasms, colon    Benign neoplasm of sigmoid colon    Primary lung cancer ([place])    Malignant neoplasm of upper lobe of right lung [facility])    PCP:  [name], MD Pharmacy:   [facility] [address] ([address]), [place] - [place]) [place] [address] Phone: [phone] Fax: [phone]     Social Determinants of Health (SDOH) Interventions    Readmission Risk Interventions No flowsheet data found.

## 2021-04-05 LAB — MAGNESIUM: Magnesium: 2.3 mg/dL (ref 1.7–2.4)

## 2021-04-05 LAB — BASIC METABOLIC PANEL
Anion gap: 3 — ABNORMAL LOW (ref 5–15)
BUN: 23 mg/dL (ref 8–23)
CO2: 34 mmol/L — ABNORMAL HIGH (ref 22–32)
Calcium: 8.6 mg/dL — ABNORMAL LOW (ref 8.9–10.3)
Chloride: 100 mmol/L (ref 98–111)
Creatinine, Ser: 0.65 mg/dL (ref 0.44–1.00)
GFR, Estimated: >60 mL/min (ref >60)
Glucose, Bld: 204 mg/dL — ABNORMAL HIGH (ref 70–99)
Potassium: 3.9 mmol/L (ref 3.5–5.1)
Sodium: 137 mmol/L (ref 135–145)

## 2021-04-05 LAB — CBC
HCT: 30.9 % — ABNORMAL LOW (ref 36.0–46.0)
Hemoglobin: 9.7 g/dL — ABNORMAL LOW (ref 12.0–15.0)
MCH: 29.4 pg (ref 26.0–34.0)
MCHC: 31.4 g/dL (ref 30.0–36.0)
MCV: 93.6 fL (ref 80.0–100.0)
Platelets: 259 10*3/uL (ref 150–400)
RBC: 3.3 MIL/uL — ABNORMAL LOW (ref 3.87–5.11)
RDW: 13.7 % (ref 11.5–15.5)
WBC: 8.8 10*3/uL (ref 4.0–10.5)
nRBC: 0.2 % (ref 0.0–0.2)

## 2021-04-05 LAB — GLUCOSE, CAPILLARY
Glucose-Capillary: 173 mg/dL — ABNORMAL HIGH (ref 70–99)
Glucose-Capillary: 291 mg/dL — ABNORMAL HIGH (ref 70–99)

## 2021-04-05 MED ORDER — IPRATROPIUM-ALBUTEROL 0.5-2.5 (3) MG/3ML IN SOLN: 3.0000 mL | Freq: Two times a day (BID) | RESPIRATORY_TRACT | Status: DC

## 2021-04-05 NOTE — Plan of Care (Signed)
No acute events during the night. VSS. Oxygen saturations maintained WNL on 2L O2 via [place]. Safety maintained.

## 2021-04-05 NOTE — Progress Notes (Signed)
Ambulated patient around nurse station on RA with 83% SpO2. Patient placed back on 2L O2 [place] and SpO2 returned to 95%.

## 2021-04-05 NOTE — Discharge Summary (Signed)
Physician Discharge Summary   Angela Jensen  female DOB: 09-25-1955  WUG:[id number]  PCP: [name], MD  Admit date: 03/31/2021 Discharge date: 04/05/2021  Admitted From: home Disposition:  home CODE STATUS: Full code  Discharge Instructions     Discharge instructions   Complete by: As directed    You have been treated for COPD flare up with steroid and breathing treatment.  You still need 2 liters of supplemental oxygen when you walk around.  Please follow up with your PCP to see when you can get off of supplemental oxygen.   Dr. Enzo Bi [facility] Course:  For full details, please see H&P, progress notes, consult notes and ancillary notes.  Briefly,  Angela Jensen is a 65 y.o. 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   Patient diagnosed with COVID 3 weeks ago with a self test at home after family members became sick.  PCP put her on Trelegy, but her shortness of breath and wheezing got worse.   In the ER, patient desaturated to 70% ambulating to the commode.  CT angiogram of the chest showed no PE, or pneumonia.  High-sensitivity troponin, minimally elevated, no chest pain.  COPD exacerbation with acute on chronic hypoxic respiratory failure Patient presented with dyspnea, tachypnea, and hypoxia to 70% with ambulation. --started on IV solumedrol and then prednisone.  Received DuoNeb. --Dyspnea improved, however, pt desat with walking, so pt was discharged on 2L O2 with ambulation.   Recent COVID Resolved, no pneumonia on CXR, no indication for COIVD specific treatments   Peripheral vascular disease --cont plavix  Hypertension BP normal --cont home Lisinopril and HCTZ   Type 2 diabetes Hyperglycemia exacerbated by steroid A1c 8.2.  Not on insulin at home. --received glargine 15u daily, mealtime 3u TID and SSI during  hospitalization.   --discharged back on home metformin.   Neuropathy --cont gabapentin   History of lung cancer CT chest without notable change -Follow-up with established oncology   Anemia of chronic disease Hgb stable, no bleeding   Discharge Diagnoses:  Principal Problem:   Acute on chronic respiratory failure with hypoxia (HCC) Active Problems:   Malignant neoplasm of upper lobe of right lung (HCC)   Essential hypertension   Diabetes (HCC)   PAD (peripheral artery disease) (HCC)   Iron deficiency anemia   Elevated troponin   30 Day Unplanned Readmission Risk Score    Flowsheet Row ED to Hosp-Admission (Current) from 03/31/2021 in [facility] (1A)  30 Day Unplanned Readmission Risk Score (%) 17.08 Filed at 04/05/2021 0800       This score is the patient's risk of an unplanned readmission within 30 days of being discharged (0 -100%). The score is based on dignosis, age, lab data, medications, orders, and past utilization.   Low:  0-14.9   Medium: 15-21.9   High: 22-29.9   Extreme: 30 and above         Discharge Instructions:  Allergies as of 04/05/2021   No Known Allergies      Medication List     STOP taking these medications    BinaxNOW COVID-19 Ag Home Test Kit Generic drug: COVID-19 At Home Antigen Test       TAKE these medications    Accu-Chek Aviva Plus test strip  Generic drug: glucose blood Use as instructed to check blood sugars three times daily.  E11.65   Accu-Chek FastClix Lancets Misc Use as directed twice a day diag E11.65   albuterol (2.5 MG/3ML) 0.083% nebulizer solution Commonly known as: PROVENTIL Take 3 mLs (2.5 mg total) by nebulization every 6 (six) hours as needed for wheezing.   albuterol 108 (90 Base) MCG/ACT inhaler Commonly known as: VENTOLIN HFA Inhale 2 puffs into the lungs every 6 (six) hours as needed for wheezing or shortness of breath.   clopidogrel 75 MG tablet Commonly known  as: Plavix Take 1 tablet (75 mg total) by mouth daily.   ferrous sulfate 324 MG Tbec Take 324 mg by mouth.   gabapentin 100 MG capsule Commonly known as: NEURONTIN Take 1 capsule (100 mg total) by mouth 2 (two) times daily.   lisinopril-hydrochlorothiazide 10-12.5 MG tablet Commonly known as: ZESTORETIC Take 1 tablet by mouth daily. Notes to patient: Not given in hospital   meloxicam 7.5 MG tablet Commonly known as: MOBIC Take 1 tablet (7.5 mg total) by mouth 2 (two) times daily as needed for pain. Home med. What changed:  when to take this reasons to take this additional instructions   metFORMIN 500 MG tablet Commonly known as: GLUCOPHAGE Take 1 tablet (500 mg total) by mouth daily with breakfast. Notes to patient: Not given in hospital   potassium chloride 10 MEQ tablet Commonly known as: KLOR-CON Take one tab po qod   Trelegy Ellipta 100-62.5-25 MCG/INH Aepb Generic drug: Fluticasone-Umeclidin-Vilant Inhale 1 puff into the lungs daily. Notes to patient: Not given in hospital               [place]  (From admission, onward)           Start     Ordered   04/05/21 0959  For home use only DME oxygen  Once       Question Answer Comment  Length of Need 6 Months   Mode or (Route) Nasal cannula   Liters per Minute 2   Frequency Continuous (stationary and portable oxygen unit needed)   Oxygen delivery system Gas      04/05/21 0959   04/04/21 1555  For home use only DME Walker rolling  Once       Question Answer Comment  Walker: With Lilly   Patient needs a walker to treat with the following condition Unstable gait      04/04/21 1555             Follow-up Information     [name], MD. Schedule an appointment as soon as possible for a visit in 1 week(s).   Specialties: Internal Medicine, Cardiology Contact information: [place] [id number] [phone]                 No Known  Allergies   The results of significant diagnostics from this hospitalization (including imaging, microbiology, ancillary and laboratory) are listed below for reference.   Consultations:   Procedures/Studies: DG Chest 2 View  Result Date: 03/31/2021 CLINICAL DATA:  Shortness of breath. EXAM: CHEST - 2 VIEW COMPARISON:  January 28, 2021. FINDINGS: The heart size and mediastinal contours are within normal limits. No pneumothorax or pleural effusion is noted. Stable right apical and lower lobe opacities are noted which may represent post treatment change. No definite acute abnormality is noted. The visualized skeletal structures are unremarkable. IMPRESSION: Stable right lung findings are noted most  consistent with post treatment change. No definite acute abnormality is noted. Electronically Signed   By: Marijo Conception M.D.   On: 03/31/2021 15:26   CT CHEST WO CONTRAST  Result Date: 03/12/2021 CLINICAL DATA:  Non-small cell lung cancer surveillance, right upper lobe diagnosed 2012, status post SBRT, right lower lobe diagnosed 2021, status post SBRT EXAM: CT CHEST WITHOUT CONTRAST TECHNIQUE: Multidetector CT imaging of the chest was performed following the standard protocol without IV contrast. COMPARISON:  None. FINDINGS: Cardiovascular: Aortic atherosclerosis. Normal heart size. Left coronary artery calcifications. No pericardial effusion. Mediastinum/Nodes: No enlarged mediastinal, hilar, or axillary lymph nodes. Thyroid gland, trachea, and esophagus demonstrate no significant findings. Lungs/Pleura: Severe centrilobular emphysema. Unchanged, post radiation appearance of the apical right upper lobe (series 3, image 24). Unchanged, bandlike post radiation fibrosis and consolidation of the lateral segment right middle lobe and right lower lobe (series 3, image 73, 94). There is a new 7 mm nodule of the peripheral left upper lobe (series 3, image 47). No pleural effusion or pneumothorax. Upper Abdomen: No  acute abnormality. Musculoskeletal: No chest wall mass or suspicious bone lesions identified. Redemonstrated, chronic fracture of the lateral right seventh rib (series 3, image 69). IMPRESSION: 1. Unchanged, post radiation appearance of the apical right upper lobe. 2. Unchanged, bandlike post radiation fibrosis and consolidation of the lateral segment right middle lobe and right lower lobe. 3. There is a new 7 mm nodule of the peripheral left upper lobe, concerning for metachronous malignancy although nonspecific and possibly infectious or inflammatory. Recommend follow-up CT in 3 months to assess for stability or resolution. This nodule is likely below the size threshold for accurate FDG PET characterization at this time. 4. Emphysema. 5. Coronary artery disease. Aortic Atherosclerosis (ICD10-I70.0) and Emphysema (ICD10-J43.9). Electronically Signed   By: Eddie Candle M.D.   On: 03/12/2021 11:33   CT Angio Chest PE W and/or Wo Contrast  Result Date: 03/31/2021 CLINICAL DATA:  PE suspected, high prob Shortness of breath since having COVID 3 weeks ago. EXAM: CT ANGIOGRAPHY CHEST WITH CONTRAST TECHNIQUE: Multidetector CT imaging of the chest was performed using the standard protocol during bolus administration of intravenous contrast. Multiplanar CT image reconstructions and MIPs were obtained to evaluate the vascular anatomy. CONTRAST:  53m OMNIPAQUE IOHEXOL 350 MG/ML SOLN COMPARISON:  Radiograph earlier today. Noncontrast chest CT 03/11/2021. Chest CT 04/11/2020 FINDINGS: Cardiovascular: There are no filling defects within the pulmonary arteries to suggest pulmonary embolus. Atherosclerosis of the thoracic aorta. No aneurysm. Heart is normal in size. There is contrast refluxing into the hepatic veins and IVC. No pericardial effusion. Mediastinum/Nodes: No mediastinal adenopathy. No hilar adenopathy. No esophageal wall thickening. No thyroid nodule. Lungs/Pleura: Breathing motion artifact limits detailed  assessment. Moderately advanced emphysema. Bandlike scarring at the right lung apex likely post treatment related, stable in appearance from prior exam. Streaky and bandlike opacities in the right middle lobe and right lung base, also stable in appearance from prior exam and likely representing post treatment related change. No evidence of acute airspace disease. 7 mm left upper lobe pulmonary nodule, series 6, image 25, is unchanged from recent prior. No new pulmonary nodules, with decreased sensitivity due to motion artifact. Upper Abdomen: Atherosclerosis of the upper abdominal aorta. Ingested material within the stomach. Musculoskeletal: Unchanged appearance of chronic right lateral seventh rib fracture, overlying post treatment related change. No acute osseous abnormalities are seen Review of the MIP images confirms the above findings. IMPRESSION: 1. No pulmonary embolus. 2. Contrast refluxing into  the hepatic veins and IVC, suggesting elevated right heart pressures. 3. No other acute intrathoracic findings. No focal airspace disease. 4. Stable treatment related changes at the right lung apex, right middle lobe and right lung base. 5. Unchanged 7 mm left upper lobe pulmonary nodule over the past 3 weeks, continued CT follow-up is recommended as recommended on previous staging exam. Aortic Atherosclerosis (ICD10-I70.0) and Emphysema (ICD10-J43.9). Electronically Signed   By: Keith Rake M.D.   On: 03/31/2021 22:32   ECHOCARDIOGRAM COMPLETE  Result Date: 04/03/2021    ECHOCARDIOGRAM REPORT   Patient Name:   ANELIESE BEAUDRY [facility] Date of Exam: 04/03/2021 Medical Rec #:  [id number]    Height:       59.0 in Accession #:    [id number]   Weight:       104.3 lb Date of Birth:  [birth year]    BSA:          1.398 m Patient Age:    47 years     BP:           102/79 mmHg Patient Gender: F            HR:           88 bpm. Exam Location:  [facility] Procedure: 2D Echo, Cardiac Doppler and Color Doppler Indications:     Dyspnea R06.00   History:         Patient has prior history of Echocardiogram examinations, most                  recent 08/09/2017. COPD, Signs/Symptoms:Shortness of Breath;                  Risk Factors:Hypertension.  Sonographer:     Sherrie Sport Referring Phys:  [id number] CHRISTOPHER P DANFORD Diagnosing Phys: Ida Rogue MD  Sonographer Comments: Technically challenging study due to limited acoustic windows and no apical window. Image acquisition challenging due to COPD and Best image view was subcostal. IMPRESSIONS  1. Challenging images  2. Left ventricular ejection fraction, by estimation, is 45 to 50%. The left ventricle has mildly decreased function. The left ventricle demonstrates global hypokinesis. Left ventricular diastolic parameters are indeterminate.  3. Right ventricular systolic function is mildly reduced. The right ventricular size is normal. FINDINGS  Left Ventricle: Left ventricular ejection fraction, by estimation, is 45 to 50%. The left ventricle has mildly decreased function. The left ventricle demonstrates global hypokinesis. The left ventricular internal cavity size was normal in size. There is  no left ventricular hypertrophy. Left ventricular diastolic parameters are indeterminate. Right Ventricle: The right ventricular size is normal. No increase in right ventricular wall thickness. Right ventricular systolic function is mildly reduced. Left Atrium: Left atrial size was normal in size. Right Atrium: Right atrial size was normal in size. Pericardium: There is no evidence of pericardial effusion. Mitral Valve: The mitral valve was not well visualized. No evidence of mitral valve regurgitation. No evidence of mitral valve stenosis. Tricuspid Valve: The tricuspid valve is not well visualized. Tricuspid valve regurgitation is not demonstrated. No evidence of tricuspid stenosis. Aortic Valve: The aortic valve was not well visualized. Aortic valve regurgitation is not visualized. Mild to moderate aortic valve  sclerosis/calcification is present, without any evidence of aortic stenosis. Pulmonic Valve: The pulmonic valve was not well visualized. Pulmonic valve regurgitation is not visualized. No evidence of pulmonic stenosis. There is no evidence of pulmonic valve vegetation. Aorta: The aortic root is normal in size and structure. Venous: The  pulmonary veins were not well visualized. The inferior vena cava is normal in size with greater than 50% respiratory variability, suggesting right atrial pressure of 3 mmHg. IAS/Shunts: No atrial level shunt detected by color flow Doppler.  LEFT VENTRICLE PLAX 2D LVIDd:         3.28 cm LVIDs:         2.40 cm LV PW:         0.89 cm LV IVS:        1.18 cm LVOT diam:     2.00 cm LVOT Area:     3.14 cm  LEFT ATRIUM         Index LA diam:    2.70 cm 1.93 cm/m                        PULMONIC VALVE AORTA                 PV Vmax:       0.28 m/s Ao Root diam: 2.70 cm PV Peak grad:  0.3 mmHg   SHUNTS Systemic Diam: 2.00 cm Ida Rogue MD Electronically signed by Ida Rogue MD Signature Date/Time: 04/03/2021/3:00:00 PM    Final       Labs: BNP (last 3 results) No results for input(s): BNP in the last 8760 hours. Basic Metabolic Panel: Recent Labs  Lab 03/31/21 1444 04/01/21 0023 04/03/21 0423 04/04/21 0316 04/05/21 0430  NA 136 135 135 135 137  K 3.8 3.5 4.4 3.7 3.9  CL 100 100 93* 99 100  CO2 '29 30 31 30 ' 34*  GLUCOSE 236* 234* 360* 220* 204*  BUN '12 10 20 ' 26* 23  CREATININE 0.57 0.61 0.58 0.77 0.65  CALCIUM 9.0 8.8* 8.7* 8.3* 8.6*  MG  --   --  2.3 2.3 2.3   Liver Function Tests: No results for input(s): AST, ALT, ALKPHOS, BILITOT, PROT, ALBUMIN in the last 168 hours. No results for input(s): LIPASE, AMYLASE in the last 168 hours. No results for input(s): AMMONIA in the last 168 hours. CBC: Recent Labs  Lab 03/31/21 1444 04/03/21 0423 04/04/21 0316 04/05/21 0430  WBC 4.5 7.8 9.0 8.8  HGB 10.6* 10.3* 9.7* 9.7*  HCT 33.5* 32.0* 30.9* 30.9*  MCV 94.1  92.2 91.2 93.6  PLT 239 261 261 259   Cardiac Enzymes: No results for input(s): CKTOTAL, CKMB, CKMBINDEX, TROPONINI in the last 168 hours. BNP: Invalid input(s): POCBNP CBG: Recent Labs  Lab 04/04/21 0742 04/04/21 1237 04/04/21 1630 04/04/21 2051 04/05/21 0743  GLUCAP 177* 351* 356* 305* 173*   D-Dimer No results for input(s): DDIMER in the last 72 hours. Hgb A1c No results for input(s): HGBA1C in the last 72 hours. Lipid Profile No results for input(s): CHOL, HDL, LDLCALC, TRIG, CHOLHDL, LDLDIRECT in the last 72 hours. Thyroid function studies No results for input(s): TSH, T4TOTAL, T3FREE, THYROIDAB in the last 72 hours.  Invalid input(s): FREET3 Anemia work up No results for input(s): VITAMINB12, FOLATE, FERRITIN, TIBC, IRON, RETICCTPCT in the last 72 hours. Urinalysis    Component Value Date/Time   COLORURINE YELLOW (A) 06/30/2020 1325   APPEARANCEUR Clear 07/29/2020 1137   LABSPEC 1.023 06/30/2020 1325   PHURINE 5.0 06/30/2020 1325   GLUCOSEU Negative 07/29/2020 1137   HGBUR NEGATIVE 06/30/2020 1325   BILIRUBINUR Negative 07/29/2020 1137   KETONESUR 5 (A) 06/30/2020 1325   PROTEINUR Negative 07/29/2020 1137   PROTEINUR NEGATIVE 06/30/2020 1325   NITRITE Negative 07/29/2020 1137  NITRITE NEGATIVE 06/30/2020 1325   LEUKOCYTESUR Negative 07/29/2020 1137   LEUKOCYTESUR NEGATIVE 06/30/2020 1325   Sepsis Labs Invalid input(s): PROCALCITONIN,  WBC,  LACTICIDVEN Microbiology Recent Results (from the past 240 hour(s))  Resp Panel by RT-PCR (Flu A&B, Covid) Nasopharyngeal Swab     Status: None   Collection Time: 03/31/21  9:38 PM   Specimen: Nasopharyngeal Swab; Nasopharyngeal(NP) swabs in vial transport medium  Result Value Ref Range Status   SARS Coronavirus 2 by RT PCR NEGATIVE NEGATIVE Final    Comment: (NOTE) SARS-CoV-2 target nucleic acids are NOT DETECTED.  The SARS-CoV-2 RNA is generally detectable in upper respiratory specimens during the acute phase of  infection. The lowest concentration of SARS-CoV-2 viral copies this assay can detect is 138 copies/mL. A negative result does not preclude SARS-Cov-2 infection and should not be used as the sole basis for treatment or other patient management decisions. A negative result may occur with  improper specimen collection/handling, submission of specimen other than nasopharyngeal swab, presence of viral mutation(s) within the areas targeted by this assay, and inadequate number of viral copies(<138 copies/mL). A negative result must be combined with clinical observations, patient history, and epidemiological information. The expected result is Negative.  Fact Sheet for Patients:  EntrepreneurPulse.com.au  Fact Sheet for Healthcare Providers:  IncredibleEmployment.be  This test is no t yet approved or cleared by the [place] FDA and  has been authorized for detection and/or diagnosis of SARS-CoV-2 by FDA under an Emergency Use Authorization (EUA). This EUA will remain  in effect (meaning this test can be used) for the duration of the COVID-19 declaration under Section 564(b)(1) of the Act, 21 U.S.C.section 360bbb-3(b)(1), unless the authorization is terminated  or revoked sooner.       Influenza A by PCR NEGATIVE NEGATIVE Final   Influenza B by PCR NEGATIVE NEGATIVE Final    Comment: (NOTE) The Xpert Xpress SARS-CoV-2/FLU/RSV plus assay is intended as an aid in the diagnosis of influenza from Nasopharyngeal swab specimens and should not be used as a sole basis for treatment. Nasal washings and aspirates are unacceptable for Xpert Xpress SARS-CoV-2/FLU/RSV testing.  Fact Sheet for Patients: EntrepreneurPulse.com.au  Fact Sheet for Healthcare Providers: IncredibleEmployment.be  This test is not yet approved or cleared by the [place] FDA and has been authorized for detection and/or diagnosis of SARS-CoV-2  by FDA under an Emergency Use Authorization (EUA). This EUA will remain in effect (meaning this test can be used) for the duration of the COVID-19 declaration under Section 564(b)(1) of the Act, 21 U.S.C. section 360bbb-3(b)(1), unless the authorization is terminated or revoked.  Performed at [facility], [address] [place], [place] [address]      Total time spend on discharging this patient, including the last patient exam, discussing the hospital stay, instructions for ongoing care as it relates to all pertinent caregivers, as well as preparing the medical discharge records, prescriptions, and/or referrals as applicable, is 35 minutes.    Enzo Bi, MD  [facility] 04/05/2021, 10:49 AM

## 2021-04-05 NOTE — TOC Progression Note (Signed)
Transition of Care [facility]) - Progression Note    Patient Details  Name: Angela Jensen MRN: [id number] Date of Birth: [birth year]  Transition of [facility] CM/SW [place], RN Phone Number: 04/05/2021, 11:01 AM  Clinical Narrative:   Patient is being discharged home today, care team amenable to outpatient PT, patient reports having reliable transportation.  Dr. Billie Ruddy signed outpatient PT form, [facility] faxed to Outpatient PT.    Patient uses home O2 through [employer], requests portable tank.  [employer] notified, states they will deliver to patient's room in preparation for discharge.  Rolling walker delivered to room by [employer].  Patient has no further concerns about discharging home.     Expected Discharge Plan: Home/Self Care Barriers to Discharge: Continued Medical Work up  Expected Discharge Plan and Services Expected Discharge Plan: Home/Self Care       Living arrangements for the past 2 months: Single Family Home Expected Discharge Date: 04/05/21               DME Arranged: Gilford Rile rolling, Oxygen DME Agency: [employer]                   Social Determinants of Health (SDOH) Interventions    Readmission Risk Interventions No flowsheet data found.

## 2021-04-07 ENCOUNTER — Other Ambulatory Visit: Payer: Self-pay

## 2021-04-08 ENCOUNTER — Other Ambulatory Visit: Payer: Self-pay

## 2021-04-08 DIAGNOSIS — E1165 Type 2 diabetes mellitus with hyperglycemia: Secondary | ICD-10-CM

## 2021-04-08 MED ORDER — MELOXICAM 7.5 MG PO TABS: 7.5000 mg | ORAL_TABLET | Freq: Two times a day (BID) | ORAL | Status: DC | PRN

## 2021-04-08 MED ORDER — GABAPENTIN 100 MG PO CAPS: 100.0000 mg | ORAL_CAPSULE | Freq: Two times a day (BID) | ORAL | 3 refills | Status: DC

## 2021-04-08 MED ORDER — GLUCOSE BLOOD VI STRP: 1.0000 | ORAL_STRIP | 5 refills | Status: DC | PRN

## 2021-04-09 ENCOUNTER — Encounter: Payer: Self-pay | Admitting: Nurse Practitioner

## 2021-04-09 ENCOUNTER — Telehealth: Payer: Self-pay

## 2021-04-09 ENCOUNTER — Ambulatory Visit: Payer: Medicare Other | Admitting: Internal Medicine

## 2021-04-09 ENCOUNTER — Other Ambulatory Visit: Payer: Self-pay

## 2021-04-09 VITALS — BP 129/78 | HR 100 | Temp 97.4°F | Resp 16 | Ht 59.0 in | Wt 117.4 lb

## 2021-04-09 DIAGNOSIS — R0902 Hypoxemia: Secondary | ICD-10-CM | POA: Diagnosis not present

## 2021-04-09 DIAGNOSIS — E1165 Type 2 diabetes mellitus with hyperglycemia: Secondary | ICD-10-CM

## 2021-04-09 DIAGNOSIS — J441 Chronic obstructive pulmonary disease with (acute) exacerbation: Secondary | ICD-10-CM

## 2021-04-09 DIAGNOSIS — D509 Iron deficiency anemia, unspecified: Secondary | ICD-10-CM | POA: Diagnosis not present

## 2021-04-09 DIAGNOSIS — R739 Hyperglycemia, unspecified: Secondary | ICD-10-CM

## 2021-04-09 DIAGNOSIS — T380X5A Adverse effect of glucocorticoids and synthetic analogues, initial encounter: Secondary | ICD-10-CM

## 2021-04-09 NOTE — Telephone Encounter (Signed)
Spoke with Angela Jensen from [employer] he is going to talk to [name] and call us pt need small oxygen concentrator

## 2021-04-09 NOTE — Progress Notes (Signed)
[facility] [place], [place] [address]  Internal MEDICINE  Office Visit Note  Patient Name: Angela Jensen  [id number]  [id number]  Date of Service: 04/17/2021     Chief Complaint  Patient presents with   Quality Metric Gaps    Pt got shingrix and pneumovax, will have records sent over, dexa, tdap   Hospitalization Follow-up    just got out of the hospital Saturday, pt has broken ribs   Depression   Diabetes   Sleep Apnea   Hyperlipidemia   Hypertension   COPD   Asthma   Anemia   Transitional care management   HPI Admit date: 03/31/2021 Discharge date: 04/05/2021 Pt is here for recent hospital follow up. Angela Jensen is a 65 y.o. 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  Patient diagnosed with COVID 3 weeks ago with a self test at home after family members became sick.  PCP was not contacted she kept using her on Trelegy which is given for COP but her shortness of breath and wheezing got worse In the ER, patient desaturated to 70%,  CT angiogram of the chest showed no PE, or pneumonia.  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   albuterol (VENTOLIN HFA) 108 (90 Base) MCG/ACT inhaler Inhale 2 puffs into the lungs every 6 (six) hours as needed for wheezing or shortness of breath.   clopidogrel  (PLAVIX) 75 MG tablet Take 1 tablet (75 mg total) by mouth daily.   ferrous sulfate 324 MG TBEC Take 324 mg by mouth.   Fluticasone-Umeclidin-Vilant (TRELEGY ELLIPTA) 100-62.5-25 MCG/INH AEPB Inhale 1 puff into the lungs daily.   gabapentin (NEURONTIN) 100 MG capsule Take 1 capsule (100 mg total) by mouth 2 (two) times daily.   lisinopril-hydrochlorothiazide (ZESTORETIC) 10-12.5 MG tablet Take 1 tablet by mouth daily.   meloxicam (MOBIC) 7.5 MG tablet Take 1 tablet (7.5 mg total) by mouth 2 (two) times daily as needed for pain. Home med.   metFORMIN (GLUCOPHAGE) 500 MG tablet Take 1 tablet (500 mg total) by mouth daily with breakfast.   potassium chloride (KLOR-CON) 10 MEQ tablet Take one tab po qod   [DISCONTINUED] Accu-Chek FastClix Lancets MISC Use as directed twice a day diag E11.65   [DISCONTINUED] glucose blood test strip 1 each by Other route as needed for other. Use as instructed   No facility-administered encounter medications on file as of 04/09/2021.    Surgical History: Past Surgical History:  Procedure Laterality Date   CESAREAN SECTION     x3   COLONOSCOPY WITH PROPOFOL N/A 06/25/2015   Procedure: COLONOSCOPY WITH PROPOFOL;  Surgeon: Lucilla Lame, MD;  Location: [facility] ENDOSCOPY;  Service: Endoscopy;  Laterality: N/A;   COLONOSCOPY WITH PROPOFOL N/A 07/26/2020   Procedure: COLONOSCOPY WITH PROPOFOL;  Surgeon: Lucilla Lame, MD;  Location: [place];  Service:  Endoscopy;  Laterality: N/A;   CYST REMOVAL LEG     and on shoulder    ESOPHAGOGASTRODUODENOSCOPY (EGD) WITH PROPOFOL N/A 07/26/2020   Procedure: ESOPHAGOGASTRODUODENOSCOPY (EGD) WITH PROPOFOL;  Surgeon: Lucilla Lame, MD;  Location: [place];  Service: Endoscopy;  Laterality: N/A;  Diabetic - oral meds   LOWER EXTREMITY ANGIOGRAPHY Left 09/29/2018   Procedure: LOWER EXTREMITY ANGIOGRAPHY;  Surgeon: Algernon Huxley, MD;  Location: [place] [facility];  Service: Cardiovascular;  Laterality: Left;   LUNG  BIOPSY  05 15 2013   has lung "spots"   PERIPHERAL VASCULAR CATHETERIZATION Left 06/01/2016   Procedure: Lower Extremity Angiography;  Surgeon: Algernon Huxley, MD;  Location: [place] [facility];  Service: Cardiovascular;  Laterality: Left;   PERIPHERAL VASCULAR CATHETERIZATION N/A 06/01/2016   Procedure: Abdominal Aortogram w/Lower Extremity;  Surgeon: Algernon Huxley, MD;  Location: [place] [facility];  Service: Cardiovascular;  Laterality: N/A;   PERIPHERAL VASCULAR CATHETERIZATION  06/01/2016   Procedure: Lower Extremity Intervention;  Surgeon: Algernon Huxley, MD;  Location: [place] [facility];  Service: Cardiovascular;;   PERIPHERAL VASCULAR CATHETERIZATION Right 06/08/2016   Procedure: Lower Extremity Angiography;  Surgeon: Algernon Huxley, MD;  Location: [place] [facility];  Service: Cardiovascular;  Laterality: Right;   PERIPHERAL VASCULAR CATHETERIZATION  06/08/2016   Procedure: Lower Extremity Intervention;  Surgeon: Algernon Huxley, MD;  Location: [place] [facility];  Service: Cardiovascular;;   SUBMANDIBULAR GLAND EXCISION Left 12/06/2020   Procedure: EXCISION SUBMANDIBULAR GLAND;  Surgeon: Beverly Gust, MD;  Location: [place];  Service: ENT;  Laterality: Left;  needs to be first case Diabetic - diet controlled    Medical History: Past Medical History:  Diagnosis Date   Anemia    Arthritis    Asthma    Atherosclerosis of native arteries of extremity with intermittent claudication ([place]) 05/26/2016   Cancer ([place]) 2012   Right Lung CA   COPD (chronic obstructive pulmonary disease) ([place])    Depression    Diabetes mellitus without complication ([place])    Patient takes Janumet   Essential hypertension 05/26/2016   Hypercholesteremia    Oxygen dependent    2L at nite    PAD (peripheral artery disease) ([place]) 06/22/2016   Peripheral vascular disease ([place])    Personal history of radiation therapy    Shortness of breath dyspnea    with exertion    Sialolithiasis    Sleep  apnea    Wears dentures    full upper and lower    Family History: Family History  Problem Relation Age of Onset   Lung cancer Father    Diabetes Mother    Hypercholesterolemia Mother    Diabetes Sister    Diabetes Maternal Grandmother    Diabetes Paternal Grandmother    Diabetes Sister    Heart attack Brother    Coronary artery disease Brother    Vascular Disease Brother    Hypertension Sister    Heart attack Brother     Social History   Socioeconomic History   Marital status: Widowed    Spouse name: Not on file   Number of children: Not on file   Years of education: Not on file   Highest education level: Not on file  Occupational History   Not on file  Tobacco Use   Smoking status: Former    Packs/day: 1.00    Years: 37.00    Pack years: 37.00    Types: Cigarettes  Quit date: 02/06/2010    Years since quitting: 11.2   Smokeless tobacco: Former    Types: Snuff  Vaping Use   Vaping Use: Never used  Substance and Sexual Activity   Alcohol use: Not Currently    Alcohol/week: 5.0 standard drinks    Types: 5 Cans of beer per week    Comment: /h x of alcohol abuse -stopped 2012- now drinks 5 beer per w   Drug use: Not Currently    Types: Marijuana, "Crack" cocaine, Cocaine    Comment: hx of cocaine use- last use 2015; last use sat marijuana6/22/19,    Sexual activity: Yes  Other Topics Concern   Not on file  Social History Narrative   Lives with Significant Other x 43 years   Social Determinants of Health   Financial Resource Strain: Not on file  Food Insecurity: Not on file  Transportation Needs: Not on file  Physical Activity: Not on file  Stress: Not on file  Social Connections: Not on file  Intimate Partner Violence: Not on file      Review of Systems  Constitutional:  Negative for chills, diaphoresis and fatigue.  HENT:  Negative for ear pain, postnasal drip and sinus pressure.   Eyes:  Negative for photophobia, discharge, redness, itching  and visual disturbance.  Respiratory:  Positive for shortness of breath. Negative for cough and wheezing.   Cardiovascular:  Negative for chest pain, palpitations and leg swelling.  Gastrointestinal:  Negative for abdominal pain, constipation, diarrhea, nausea and vomiting.  Genitourinary:  Negative for dysuria and flank pain.  Musculoskeletal:  Negative for arthralgias, back pain, gait problem and neck pain.  Skin:  Negative for color change.  Allergic/Immunologic: Negative for environmental allergies and food allergies.  Neurological:  Positive for weakness. Negative for dizziness and headaches.  Hematological:  Does not bruise/bleed easily.  Psychiatric/Behavioral:  Negative for agitation, behavioral problems (depression) and hallucinations.    Vital Signs: BP 129/78   Pulse 100   Temp (!) 97.4 F (36.3 C)   Resp 16   Ht 4\' 11"  (1.499 m)   Wt 117 lb 6.4 oz (53.3 kg)   SpO2 96% Comment: room air, 99% with 2L  BMI 23.71 kg/m    Physical Exam Constitutional:      Appearance: Normal appearance.  HENT:     Head: Normocephalic and atraumatic.     Nose: Nose normal.     Mouth/Throat:     Mouth: Mucous membranes are moist.     Pharynx: No posterior oropharyngeal erythema.  Eyes:     Extraocular Movements: Extraocular movements intact.     Pupils: Pupils are equal, round, and reactive to light.  Cardiovascular:     Pulses: Normal pulses.     Heart sounds: Normal heart sounds.  Pulmonary:     Effort: Pulmonary effort is normal.     Breath sounds: Wheezing present.  Neurological:     General: No focal deficit present.     Mental Status: She is alert.  Psychiatric:        Mood and Affect: Mood normal.        Behavior: Behavior normal.      Assessment/Plan: 1. Steroid-induced hyperglycemia Patient has been having elevated blood sugars more than 300, she has not been given any sliding scale on any coverage for elevated blood sugars start samples of Basaglar 4 units with  supper and she will call the office for further instruction if fasting blood sugars are above 200 it  will be titrated accordingly  2. Acute exacerbation of chronic obstructive pulmonary disease (COPD) ([place]) Patient is instructed to continue to use her nebulizer and MDI as before, continue oxygen we will monitor closely  3. Hypoxemia Patient continues to be hypoxemic with O2 saturations of below 88%, she was instructed to continue portable oxygen and will give an order for DME company for [employer] - 6 minute walk - For home use only DME oxygen  4. Iron deficiency anemia, unspecified iron deficiency anemia type This is followed by hematology patient has required blood transfusion in the past, update CBC  General Counseling: Angela Jensen verbalizes understanding of the findings of todays visit and agrees with plan of treatment. I have discussed any further diagnostic evaluation that may be needed or ordered today. We also reviewed her medications today. she has been encouraged to call the office with any questions or concerns that should arise related to todays visit.    Counseling:  [place] Controlled Substance Database was reviewed by me.  Orders Placed This Encounter  Procedures   For home use only DME oxygen   CBC with Differential/Platelet   6 minute walk    High and critical level decision making for complex medical problems   I have reviewed all medical records from hospital follow up including radiology reports and consults from other physicians. Appropriate follow up diagnostics will be scheduled as needed. Patient/ Family understands the plan of treatment. Time spent45 minutes.   Dr [name], MD Internal Medicine

## 2021-04-11 ENCOUNTER — Other Ambulatory Visit: Payer: Self-pay

## 2021-04-11 MED ORDER — ACCU-CHEK GUIDE W/DEVICE KIT: PACK | 0 refills | Status: AC

## 2021-04-11 MED ORDER — ACCU-CHEK GUIDE VI STRP: ORAL_STRIP | 1 refills | Status: DC

## 2021-04-11 MED ORDER — ACCU-CHEK SOFTCLIX LANCETS MISC: 1 refills | Status: DC

## 2021-04-11 NOTE — Telephone Encounter (Signed)
Spoke with phar that her insurance covered accuchek send pres to [employer]

## 2021-04-17 ENCOUNTER — Telehealth: Payer: Self-pay

## 2021-04-17 ENCOUNTER — Other Ambulatory Visit: Payer: Self-pay

## 2021-04-17 NOTE — Telephone Encounter (Signed)
Pt  called  that her glucose was high in 200 yesterday fasting 201,230 as per dr Humphrey Rolls advised her continue metformin as prescribed and increase her insulin humalog 6 units at supper and check glucose and call us back

## 2021-04-22 ENCOUNTER — Other Ambulatory Visit: Payer: Self-pay

## 2021-04-22 ENCOUNTER — Encounter: Payer: Self-pay | Admitting: Internal Medicine

## 2021-04-22 ENCOUNTER — Ambulatory Visit (INDEPENDENT_AMBULATORY_CARE_PROVIDER_SITE_OTHER): Payer: Medicare Other | Admitting: Internal Medicine

## 2021-04-22 ENCOUNTER — Other Ambulatory Visit: Payer: Self-pay | Admitting: Internal Medicine

## 2021-04-22 ENCOUNTER — Telehealth: Payer: Self-pay

## 2021-04-22 VITALS — BP 118/72 | HR 105 | Temp 98.3°F | Resp 16 | Ht 59.0 in | Wt 116.0 lb

## 2021-04-22 DIAGNOSIS — R739 Hyperglycemia, unspecified: Secondary | ICD-10-CM | POA: Diagnosis not present

## 2021-04-22 DIAGNOSIS — T380X5A Adverse effect of glucocorticoids and synthetic analogues, initial encounter: Secondary | ICD-10-CM

## 2021-04-22 DIAGNOSIS — R0902 Hypoxemia: Secondary | ICD-10-CM

## 2021-04-22 DIAGNOSIS — R102 Pelvic and perineal pain: Secondary | ICD-10-CM | POA: Diagnosis not present

## 2021-04-22 DIAGNOSIS — R300 Dysuria: Secondary | ICD-10-CM

## 2021-04-22 LAB — POCT URINALYSIS DIPSTICK
Bilirubin, UA: NEGATIVE
Blood, UA: NEGATIVE
Glucose, UA: NEGATIVE
Ketones, UA: NEGATIVE
Leukocytes, UA: NEGATIVE
Nitrite, UA: NEGATIVE
Protein, UA: POSITIVE — AB
Spec Grav, UA: 1.01 (ref 1.010–1.025)
Urobilinogen, UA: 0.2 E.U./dL
pH, UA: 6 (ref 5.0–8.0)

## 2021-04-22 MED ORDER — NITROFURANTOIN MONOHYD MACRO 100 MG PO CAPS: ORAL_CAPSULE | ORAL | 0 refills | Status: DC

## 2021-04-22 MED ORDER — LEVEMIR FLEXTOUCH 100 UNIT/ML ~~LOC~~ SOPN: PEN_INJECTOR | SUBCUTANEOUS | 11 refills | Status: DC

## 2021-04-22 NOTE — Progress Notes (Signed)
[facility] [place], [place] [address]  Internal MEDICINE  Office Visit Note  Patient Name: Angela Jensen  [id number]  [id number]  Date of Service: 04/22/2021  Chief Complaint  Patient presents with   Follow-up    Stomach ache since yesterday, oxygen and dm    Depression   Diabetes   Sleep Apnea   Hypertension   Hyperlipidemia   Anemia   COPD   Asthma   Quality Metric Gaps    Pt had tdap, shingrix, pneumovax, needs dexa    HPI  Patient is here for routine follow-up. -She has been having elevated glucose due to steroid intake, she was given samples of Levemir and has been taking 4 to 6 units with supper her fasting blood sugars are still elevated between 180-220, -Breathing is better she continues to be on her oxygen still waiting light weight shoulder bag -Complains of suprapubic pain and denies any dysuria has noticed discoloration of her urine  Current Medication: Outpatient Encounter Medications as of 04/22/2021  Medication Sig   Accu-Chek Softclix Lancets lancets Use as instructed for twice a daily Dx E11.65   albuterol (PROVENTIL) (2.5 MG/3ML) 0.083% nebulizer solution Take 3 mLs (2.5 mg total) by nebulization every 6 (six) hours as needed for wheezing.   albuterol (VENTOLIN HFA) 108 (90 Base) MCG/ACT inhaler Inhale 2 puffs into the lungs every 6 (six) hours as needed for wheezing or shortness of breath.   Blood Glucose Monitoring Suppl (ACCU-CHEK GUIDE) w/Device KIT Use as directed Dx e11.65   clopidogrel (PLAVIX) 75 MG tablet Take 1 tablet (75 mg total) by mouth daily.   ferrous sulfate 324 MG TBEC Take 324 mg by mouth.   Fluticasone-Umeclidin-Vilant (TRELEGY ELLIPTA) 100-62.5-25 MCG/INH AEPB Inhale 1 puff into the lungs daily.   gabapentin (NEURONTIN) 100 MG capsule Take 1 capsule (100 mg total) by mouth 2 (two) times daily.   glucose blood (ACCU-CHEK GUIDE) test strip Use as instructed twice a daily DX E11.65   insulin detemir (LEVEMIR  FLEXTOUCH) 100 UNIT/ML FlexPen Inject 8- 12 units with supper qd// with needles   lisinopril-hydrochlorothiazide (ZESTORETIC) 10-12.5 MG tablet Take 1 tablet by mouth daily.   meloxicam (MOBIC) 7.5 MG tablet Take 1 tablet (7.5 mg total) by mouth 2 (two) times daily as needed for pain. Home med.   metFORMIN (GLUCOPHAGE) 500 MG tablet Take 1 tablet (500 mg total) by mouth daily with breakfast.   potassium chloride (KLOR-CON) 10 MEQ tablet Take one tab po qod   No facility-administered encounter medications on file as of 04/22/2021.    Surgical History: Past Surgical History:  Procedure Laterality Date   CESAREAN SECTION     x3   COLONOSCOPY WITH PROPOFOL N/A 06/25/2015   Procedure: COLONOSCOPY WITH PROPOFOL;  Surgeon: Lucilla Lame, MD;  Location: [facility];  Service: Endoscopy;  Laterality: N/A;   COLONOSCOPY WITH PROPOFOL N/A 07/26/2020   Procedure: COLONOSCOPY WITH PROPOFOL;  Surgeon: Lucilla Lame, MD;  Location: [place];  Service: Endoscopy;  Laterality: N/A;   CYST REMOVAL LEG     and on shoulder    ESOPHAGOGASTRODUODENOSCOPY (EGD) WITH PROPOFOL N/A 07/26/2020   Procedure: ESOPHAGOGASTRODUODENOSCOPY (EGD) WITH PROPOFOL;  Surgeon: Lucilla Lame, MD;  Location: [place];  Service: Endoscopy;  Laterality: N/A;  Diabetic - oral meds   LOWER EXTREMITY ANGIOGRAPHY Left 09/29/2018   Procedure: LOWER EXTREMITY ANGIOGRAPHY;  Surgeon: Algernon Huxley, MD;  Location: [place] [facility];  Service: Cardiovascular;  Laterality: Left;  LUNG BIOPSY  05 15 2013   has lung "spots"   PERIPHERAL VASCULAR CATHETERIZATION Left 06/01/2016   Procedure: Lower Extremity Angiography;  Surgeon: Algernon Huxley, MD;  Location: [place] [facility];  Service: Cardiovascular;  Laterality: Left;   PERIPHERAL VASCULAR CATHETERIZATION N/A 06/01/2016   Procedure: Abdominal Aortogram w/Lower Extremity;  Surgeon: Algernon Huxley, MD;  Location: [place] [facility];  Service: Cardiovascular;  Laterality:  N/A;   PERIPHERAL VASCULAR CATHETERIZATION  06/01/2016   Procedure: Lower Extremity Intervention;  Surgeon: Algernon Huxley, MD;  Location: [place] [facility];  Service: Cardiovascular;;   PERIPHERAL VASCULAR CATHETERIZATION Right 06/08/2016   Procedure: Lower Extremity Angiography;  Surgeon: Algernon Huxley, MD;  Location: [place] [facility];  Service: Cardiovascular;  Laterality: Right;   PERIPHERAL VASCULAR CATHETERIZATION  06/08/2016   Procedure: Lower Extremity Intervention;  Surgeon: Algernon Huxley, MD;  Location: [place] [facility];  Service: Cardiovascular;;   SUBMANDIBULAR GLAND EXCISION Left 12/06/2020   Procedure: EXCISION SUBMANDIBULAR GLAND;  Surgeon: Beverly Gust, MD;  Location: [place];  Service: ENT;  Laterality: Left;  needs to be first case Diabetic - diet controlled    Medical History: Past Medical History:  Diagnosis Date   Anemia    Arthritis    Asthma    Atherosclerosis of native arteries of extremity with intermittent claudication ([place]) 05/26/2016   Cancer ([place]) 2012   Right Lung CA   COPD (chronic obstructive pulmonary disease) ([place])    Depression    Diabetes mellitus without complication ([place])    Patient takes Janumet   Essential hypertension 05/26/2016   Hypercholesteremia    Oxygen dependent    2L at nite    PAD (peripheral artery disease) ([place]) 06/22/2016   Peripheral vascular disease ([place])    Personal history of radiation therapy    Shortness of breath dyspnea    with exertion    Sialolithiasis    Sleep apnea    Wears dentures    full upper and lower    Family History: Family History  Problem Relation Age of Onset   Lung cancer Father    Diabetes Mother    Hypercholesterolemia Mother    Diabetes Sister    Diabetes Maternal Grandmother    Diabetes Paternal Grandmother    Diabetes Sister    Heart attack Brother    Coronary artery disease Brother    Vascular Disease Brother    Hypertension Sister    Heart attack Brother      Social History   Socioeconomic History   Marital status: Widowed    Spouse name: Not on file   Number of children: Not on file   Years of education: Not on file   Highest education level: Not on file  Occupational History   Not on file  Tobacco Use   Smoking status: Former    Packs/day: 1.00    Years: 37.00    Pack years: 37.00    Types: Cigarettes    Quit date: 02/06/2010    Years since quitting: 11.2   Smokeless tobacco: Former    Types: Snuff  Vaping Use   Vaping Use: Never used  Substance and Sexual Activity   Alcohol use: Not Currently    Alcohol/week: 5.0 standard drinks    Types: 5 Cans of beer per week    Comment: /h x of alcohol abuse -stopped 2012- now drinks 5 beer per w   Drug use: Not Currently    Types:  Marijuana, "Crack" cocaine, Cocaine    Comment: hx of cocaine use- last use 2015; last use sat marijuana6/22/19,    Sexual activity: Yes  Other Topics Concern   Not on file  Social History Narrative   Lives with Significant Other x 43 years   Social Determinants of Health   Financial Resource Strain: Not on file  Food Insecurity: Not on file  Transportation Needs: Not on file  Physical Activity: Not on file  Stress: Not on file  Social Connections: Not on file  Intimate Partner Violence: Not on file      Review of Systems  Constitutional:  Negative for fatigue and fever.  HENT:  Negative for congestion, mouth sores and postnasal drip.   Respiratory:  Negative for cough.   Cardiovascular:  Negative for chest pain.  Genitourinary:  Negative for flank pain.       Pain in suprapubic area   Psychiatric/Behavioral: Negative.     Vital Signs: BP 118/72   Pulse (!) 105   Temp 98.3 F (36.8 C)   Resp 16   Ht '4\' 11"'  (1.499 m)   Wt 116 lb (52.6 kg)   SpO2 93% Comment: room air just sitting, 99% 2.5L  BMI 23.43 kg/m    Physical Exam Constitutional:      Appearance: Normal appearance.  HENT:     Head: Normocephalic and atraumatic.      Nose: Nose normal.     Mouth/Throat:     Mouth: Mucous membranes are moist.     Pharynx: No posterior oropharyngeal erythema.  Eyes:     Extraocular Movements: Extraocular movements intact.     Pupils: Pupils are equal, round, and reactive to light.  Cardiovascular:     Pulses: Normal pulses.     Heart sounds: Normal heart sounds.  Pulmonary:     Effort: Pulmonary effort is normal.     Breath sounds: Normal breath sounds.  Neurological:     General: No focal deficit present.     Mental Status: She is alert.  Psychiatric:        Mood and Affect: Mood normal.        Behavior: Behavior normal.       Assessment/Plan: 1. Suprapubic pain Urine is discolored, questionable hematuria we will send urine for culture and sensitivities - CULTURE, URINE COMPREHENSIVE - POCT Urinalysis Dipstick  2. Steroid-induced hyperglycemia Increase Levemir to 8 units and patient was given titration schedule, if her blood sugars are above 180 she needs to increase her insulin by 1 unit  3. Hypoxemia Continue to use her oxygen with ambulation, recovering from COVID-pneumonia, has history of COPD///  lung cancer  General Counseling: Annalaura verbalizes understanding of the findings of todays visit and agrees with plan of treatment. I have discussed any further diagnostic evaluation that may be needed or ordered today. We also reviewed her medications today. she has been encouraged to call the office with any questions or concerns that should arise related to todays visit.    Orders Placed This Encounter  Procedures   CULTURE, URINE COMPREHENSIVE   POCT Urinalysis Dipstick    Meds ordered this encounter  Medications   insulin detemir (LEVEMIR FLEXTOUCH) 100 UNIT/ML FlexPen    Sig: Inject 8- 12 units with supper qd// with needles    Dispense:  6 mL    Refill:  11    Total time spent:30 Minutes Time spent includes review of chart, medications, test results, and follow up plan with the patient.  [place] Controlled Substance Database was reviewed by me.   Dr [name] Internal medicine

## 2021-04-22 NOTE — Telephone Encounter (Signed)
Spoke with [facility] (ashley)she said she will take care off this and call me back when its done

## 2021-04-23 ENCOUNTER — Telehealth: Payer: Self-pay

## 2021-04-23 ENCOUNTER — Other Ambulatory Visit: Payer: Self-pay

## 2021-04-23 DIAGNOSIS — R0602 Shortness of breath: Secondary | ICD-10-CM

## 2021-04-23 NOTE — Telephone Encounter (Signed)
Angela Jensen from [facility] called they already call pt  and they schedule her for test to get her OCD and I put order for evaluate

## 2021-04-28 ENCOUNTER — Other Ambulatory Visit: Payer: Self-pay | Admitting: Internal Medicine

## 2021-04-30 ENCOUNTER — Emergency Department: Payer: Medicare Other

## 2021-04-30 ENCOUNTER — Encounter: Payer: Self-pay | Admitting: Emergency Medicine

## 2021-04-30 ENCOUNTER — Other Ambulatory Visit: Payer: Self-pay

## 2021-04-30 ENCOUNTER — Inpatient Hospital Stay
Admission: EM | Admit: 2021-04-30 | Discharge: 2021-05-08 | DRG: 190 | Disposition: A | Discharge status: Home Health | Payer: Medicare Other | Attending: Internal Medicine | Admitting: Internal Medicine

## 2021-04-30 DIAGNOSIS — G4730 Sleep apnea, unspecified: Secondary | ICD-10-CM | POA: Diagnosis present

## 2021-04-30 DIAGNOSIS — Y95 Nosocomial condition: Secondary | ICD-10-CM | POA: Diagnosis present

## 2021-04-30 DIAGNOSIS — J441 Chronic obstructive pulmonary disease with (acute) exacerbation: Secondary | ICD-10-CM | POA: Diagnosis present

## 2021-04-30 DIAGNOSIS — Z20822 Contact with and (suspected) exposure to covid-19: Secondary | ICD-10-CM | POA: Diagnosis present

## 2021-04-30 DIAGNOSIS — I447 Left bundle-branch block, unspecified: Secondary | ICD-10-CM | POA: Diagnosis present

## 2021-04-30 DIAGNOSIS — I248 Other forms of acute ischemic heart disease: Secondary | ICD-10-CM | POA: Diagnosis present

## 2021-04-30 DIAGNOSIS — D509 Iron deficiency anemia, unspecified: Secondary | ICD-10-CM | POA: Diagnosis present

## 2021-04-30 DIAGNOSIS — Z83438 Family history of other disorder of lipoprotein metabolism and other lipidemia: Secondary | ICD-10-CM

## 2021-04-30 DIAGNOSIS — E871 Hypo-osmolality and hyponatremia: Secondary | ICD-10-CM | POA: Diagnosis present

## 2021-04-30 DIAGNOSIS — Z7984 Long term (current) use of oral hypoglycemic drugs: Secondary | ICD-10-CM

## 2021-04-30 DIAGNOSIS — R0602 Shortness of breath: Secondary | ICD-10-CM

## 2021-04-30 DIAGNOSIS — Z923 Personal history of irradiation: Secondary | ICD-10-CM

## 2021-04-30 DIAGNOSIS — Z794 Long term (current) use of insulin: Secondary | ICD-10-CM | POA: Diagnosis not present

## 2021-04-30 DIAGNOSIS — R7989 Other specified abnormal findings of blood chemistry: Secondary | ICD-10-CM | POA: Diagnosis present

## 2021-04-30 DIAGNOSIS — F419 Anxiety disorder, unspecified: Secondary | ICD-10-CM | POA: Diagnosis not present

## 2021-04-30 DIAGNOSIS — E119 Type 2 diabetes mellitus without complications: Secondary | ICD-10-CM

## 2021-04-30 DIAGNOSIS — F410 Panic disorder [episodic paroxysmal anxiety] without agoraphobia: Secondary | ICD-10-CM | POA: Diagnosis present

## 2021-04-30 DIAGNOSIS — Z801 Family history of malignant neoplasm of trachea, bronchus and lung: Secondary | ICD-10-CM | POA: Diagnosis not present

## 2021-04-30 DIAGNOSIS — T380X5A Adverse effect of glucocorticoids and synthetic analogues, initial encounter: Secondary | ICD-10-CM | POA: Diagnosis not present

## 2021-04-30 DIAGNOSIS — Z87891 Personal history of nicotine dependence: Secondary | ICD-10-CM

## 2021-04-30 DIAGNOSIS — E1142 Type 2 diabetes mellitus with diabetic polyneuropathy: Secondary | ICD-10-CM | POA: Diagnosis present

## 2021-04-30 DIAGNOSIS — E1165 Type 2 diabetes mellitus with hyperglycemia: Secondary | ICD-10-CM | POA: Diagnosis not present

## 2021-04-30 DIAGNOSIS — R778 Other specified abnormalities of plasma proteins: Secondary | ICD-10-CM | POA: Diagnosis not present

## 2021-04-30 DIAGNOSIS — J9611 Chronic respiratory failure with hypoxia: Secondary | ICD-10-CM | POA: Diagnosis present

## 2021-04-30 DIAGNOSIS — Z8616 Personal history of COVID-19: Secondary | ICD-10-CM | POA: Diagnosis not present

## 2021-04-30 DIAGNOSIS — Z8249 Family history of ischemic heart disease and other diseases of the circulatory system: Secondary | ICD-10-CM

## 2021-04-30 DIAGNOSIS — E1151 Type 2 diabetes mellitus with diabetic peripheral angiopathy without gangrene: Secondary | ICD-10-CM | POA: Diagnosis present

## 2021-04-30 DIAGNOSIS — Z833 Family history of diabetes mellitus: Secondary | ICD-10-CM | POA: Diagnosis not present

## 2021-04-30 DIAGNOSIS — E7800 Pure hypercholesterolemia, unspecified: Secondary | ICD-10-CM | POA: Diagnosis present

## 2021-04-30 DIAGNOSIS — I110 Hypertensive heart disease with heart failure: Secondary | ICD-10-CM | POA: Diagnosis present

## 2021-04-30 DIAGNOSIS — Z7902 Long term (current) use of antithrombotics/antiplatelets: Secondary | ICD-10-CM | POA: Diagnosis not present

## 2021-04-30 DIAGNOSIS — Z85118 Personal history of other malignant neoplasm of bronchus and lung: Secondary | ICD-10-CM

## 2021-04-30 DIAGNOSIS — R0902 Hypoxemia: Secondary | ICD-10-CM

## 2021-04-30 DIAGNOSIS — I5022 Chronic systolic (congestive) heart failure: Secondary | ICD-10-CM | POA: Diagnosis present

## 2021-04-30 DIAGNOSIS — Y92239 Unspecified place in hospital as the place of occurrence of the external cause: Secondary | ICD-10-CM | POA: Diagnosis not present

## 2021-04-30 DIAGNOSIS — J189 Pneumonia, unspecified organism: Secondary | ICD-10-CM | POA: Diagnosis present

## 2021-04-30 DIAGNOSIS — Z79899 Other long term (current) drug therapy: Secondary | ICD-10-CM

## 2021-04-30 DIAGNOSIS — D638 Anemia in other chronic diseases classified elsewhere: Secondary | ICD-10-CM | POA: Diagnosis present

## 2021-04-30 DIAGNOSIS — I10 Essential (primary) hypertension: Secondary | ICD-10-CM | POA: Diagnosis present

## 2021-04-30 DIAGNOSIS — Z9981 Dependence on supplemental oxygen: Secondary | ICD-10-CM

## 2021-04-30 DIAGNOSIS — R0600 Dyspnea, unspecified: Secondary | ICD-10-CM

## 2021-04-30 DIAGNOSIS — I5042 Chronic combined systolic (congestive) and diastolic (congestive) heart failure: Secondary | ICD-10-CM | POA: Diagnosis present

## 2021-04-30 LAB — BASIC METABOLIC PANEL
Anion gap: 7 (ref 5–15)
BUN: 19 mg/dL (ref 8–23)
CO2: 27 mmol/L (ref 22–32)
Calcium: 8.6 mg/dL — ABNORMAL LOW (ref 8.9–10.3)
Chloride: 100 mmol/L (ref 98–111)
Creatinine, Ser: 0.6 mg/dL (ref 0.44–1.00)
GFR, Estimated: >60 mL/min (ref >60)
Glucose, Bld: 174 mg/dL — ABNORMAL HIGH (ref 70–99)
Potassium: 3.6 mmol/L (ref 3.5–5.1)
Sodium: 134 mmol/L — ABNORMAL LOW (ref 135–145)

## 2021-04-30 LAB — CULTURE, URINE COMPREHENSIVE

## 2021-04-30 LAB — CBC
HCT: 31.8 % — ABNORMAL LOW (ref 36.0–46.0)
Hemoglobin: 10.1 g/dL — ABNORMAL LOW (ref 12.0–15.0)
MCH: 29.2 pg (ref 26.0–34.0)
MCHC: 31.8 g/dL (ref 30.0–36.0)
MCV: 91.9 fL (ref 80.0–100.0)
Platelets: 273 10*3/uL (ref 150–400)
RBC: 3.46 MIL/uL — ABNORMAL LOW (ref 3.87–5.11)
RDW: 13.4 % (ref 11.5–15.5)
WBC: 6.2 10*3/uL (ref 4.0–10.5)
nRBC: 0 % (ref 0.0–0.2)

## 2021-04-30 LAB — RESP PANEL BY RT-PCR (FLU A&B, COVID) ARPGX2
Influenza A by PCR: NEGATIVE
Influenza B by PCR: NEGATIVE
SARS Coronavirus 2 by RT PCR: NEGATIVE

## 2021-04-30 LAB — TROPONIN I (HIGH SENSITIVITY): Troponin I (High Sensitivity): 35 ng/L — ABNORMAL HIGH (ref <18)

## 2021-04-30 MED ORDER — IPRATROPIUM-ALBUTEROL 0.5-2.5 (3) MG/3ML IN SOLN
3.0000 mL | Freq: Once | RESPIRATORY_TRACT | Status: AC
  Administered 2021-04-30: 3 mL via RESPIRATORY_TRACT

## 2021-04-30 MED ORDER — IPRATROPIUM-ALBUTEROL 0.5-2.5 (3) MG/3ML IN SOLN
3.0000 mL | Freq: Once | RESPIRATORY_TRACT | Status: AC
  Administered 2021-04-30: 3 mL via RESPIRATORY_TRACT
  Filled 2021-04-30: qty 3

## 2021-04-30 MED ORDER — INSULIN ASPART 100 UNIT/ML IJ SOLN
0.0000 [IU] | Freq: Three times a day (TID) | INTRAMUSCULAR | Status: DC
  Administered 2021-05-01: 7 [IU] via SUBCUTANEOUS
  Administered 2021-05-01 – 2021-05-02 (×4): 5 [IU] via SUBCUTANEOUS
  Administered 2021-05-02: 2 [IU] via SUBCUTANEOUS
  Administered 2021-05-02: 5 [IU] via SUBCUTANEOUS
  Administered 2021-05-03: 3 [IU] via SUBCUTANEOUS
  Administered 2021-05-03: 7 [IU] via SUBCUTANEOUS
  Administered 2021-05-04: 5 [IU] via SUBCUTANEOUS
  Administered 2021-05-04: 7 [IU] via SUBCUTANEOUS
  Administered 2021-05-05: 9 [IU] via SUBCUTANEOUS
  Administered 2021-05-05: 5 [IU] via SUBCUTANEOUS
  Administered 2021-05-05: 1 [IU] via SUBCUTANEOUS
  Filled 2021-04-30 (×13): qty 1

## 2021-04-30 MED ORDER — METHYLPREDNISOLONE SODIUM SUCC 125 MG IJ SOLR
80.0000 mg | Freq: Two times a day (BID) | INTRAMUSCULAR | Status: DC
  Administered 2021-05-01: 80 mg via INTRAVENOUS
  Filled 2021-04-30: qty 2

## 2021-04-30 MED ORDER — ACETAMINOPHEN 325 MG PO TABS
650.0000 mg | ORAL_TABLET | Freq: Four times a day (QID) | ORAL | Status: DC | PRN
  Administered 2021-05-01 – 2021-05-07 (×4): 650 mg via ORAL
  Filled 2021-04-30 (×4): qty 2

## 2021-04-30 MED ORDER — VANCOMYCIN HCL IN DEXTROSE 1-5 GM/200ML-% IV SOLN
1000.0000 mg | Freq: Once | INTRAVENOUS | Status: AC
  Administered 2021-05-01: 1000 mg via INTRAVENOUS
  Filled 2021-04-30: qty 200

## 2021-04-30 MED ORDER — IPRATROPIUM-ALBUTEROL 0.5-2.5 (3) MG/3ML IN SOLN
3.0000 mL | Freq: Four times a day (QID) | RESPIRATORY_TRACT | Status: DC
  Administered 2021-05-01 – 2021-05-02 (×2): 3 mL via RESPIRATORY_TRACT
  Filled 2021-04-30 (×2): qty 3

## 2021-04-30 MED ORDER — SODIUM CHLORIDE 0.9 % IV SOLN
2.0000 g | Freq: Once | INTRAVENOUS | Status: AC
  Administered 2021-04-30: 2 g via INTRAVENOUS
  Filled 2021-04-30: qty 2

## 2021-04-30 MED ORDER — ACETAMINOPHEN 650 MG RE SUPP: 650.0000 mg | Freq: Four times a day (QID) | RECTAL | Status: DC | PRN

## 2021-04-30 MED ORDER — ALBUTEROL SULFATE (2.5 MG/3ML) 0.083% IN NEBU
2.5000 mg | INHALATION_SOLUTION | RESPIRATORY_TRACT | Status: DC | PRN
  Administered 2021-05-02: 2.5 mg via RESPIRATORY_TRACT
  Filled 2021-04-30 (×2): qty 3

## 2021-04-30 MED ORDER — METHYLPREDNISOLONE SODIUM SUCC 125 MG IJ SOLR
125.0000 mg | Freq: Once | INTRAMUSCULAR | Status: AC
  Administered 2021-04-30: 125 mg via INTRAVENOUS
  Filled 2021-04-30: qty 2

## 2021-04-30 NOTE — ED Notes (Signed)
Patient transported to X-ray 

## 2021-04-30 NOTE — ED Provider Notes (Signed)
[facility] Emergency Department Provider Note   ____________________________________________   I have reviewed the triage vital signs and the nursing notes.   HISTORY  Chief Complaint Shortness of Breath   History limited by: Not Limited   HPI Angela Jensen is a 65 y.o. female who presents to the emergency department today because of concern for shortness of breath and increased oxygen requirement. The patient states she has a history of COPD and is typically on 2.5 liters of oxygen at baseline. She has had increasing shortness of breath. Today she noticed that her oxygen reading was low when she was exerting herself. Denies any associated chest pain but states she has had some tightness. The patient denies any fevers. No leg swelling or pain. The patient states that she has had similar symptoms in the past.    Records reviewed. Per medical record review patient has a history of COPD. Admission to the hospital for COPD exacerbation last month.   Past Medical History:  Diagnosis Date   Anemia    Arthritis    Asthma    Atherosclerosis of native arteries of extremity with intermittent claudication ([place]) 05/26/2016   Cancer ([place]) 2012   Right Lung CA   COPD (chronic obstructive pulmonary disease) ([place])    Depression    Diabetes mellitus without complication ([place])    Patient takes Janumet   Essential hypertension 05/26/2016   Hypercholesteremia    Oxygen dependent    2L at nite    PAD (peripheral artery disease) ([place]) 06/22/2016   Peripheral vascular disease ([place])    Personal history of radiation therapy    Shortness of breath dyspnea    with exertion    Sialolithiasis    Sleep apnea    Wears dentures    full upper and lower    Patient Active Problem List   Diagnosis Date Noted   Elevated troponin 04/01/2021   Acute on chronic respiratory failure with hypoxia ([place]) 03/31/2021   Pneumothorax after biopsy 05/03/2020   Pneumothorax on right  05/01/2020   Chest pain on breathing 04/21/2020   Pleuritic chest pain 04/11/2020   Right lower lobe pulmonary nodule 04/02/2020   Iron deficiency anemia 04/02/2020   Pica in adults 04/02/2020   Goals of care, counseling/discussion 03/14/2020   Acute non-recurrent pansinusitis 01/01/2020   Pain in left wrist 09/24/2019   Encounter for general adult medical examination with abnormal findings 06/28/2019   Elevated total protein 01/22/2019   Uncontrolled type 2 diabetes mellitus with hyperglycemia ([place]) 05/16/2018   Chronic obstructive pulmonary disease ([place]) 05/16/2018   Vitamin D deficiency 05/16/2018   Flu vaccine need 05/16/2018   Dysuria 05/16/2018   Cancer of lower lobe of right lung ([place]) 01/21/2018   Screening for breast cancer 10/12/2017   Personal history of tobacco use, presenting hazards to health 02/03/2017   PAD (peripheral artery disease) ([place]) 06/22/2016   Essential hypertension 05/26/2016   Diabetes ([place]) 05/26/2016   Hyperlipidemia 05/26/2016   Atherosclerosis of native arteries of extremity with intermittent claudication ([place]) 05/26/2016   Uterine leiomyoma 04/30/2016   Elevated CEA 01/31/2016   Special screening for malignant neoplasms, colon    Benign neoplasm of sigmoid colon    Primary lung cancer ([place])    Malignant neoplasm of upper lobe of right lung [facility])     Past Surgical History:  Procedure Laterality Date   CESAREAN SECTION     x3   COLONOSCOPY WITH PROPOFOL N/A 06/25/2015   Procedure: COLONOSCOPY WITH  PROPOFOL;  Surgeon: Lucilla Lame, MD;  Location[facility] ENDOSCOPY;  Service: Endoscopy;  Laterality: N/A;   COLONOSCOPY WITH PROPOFOL N/A 07/26/2020   Procedure: COLONOSCOPY WITH PROPOFOL;  Surgeon: Lucilla Lame, MD;  Location: [place];  Service: Endoscopy;  Laterality: N/A;   CYST REMOVAL LEG     and on shoulder    ESOPHAGOGASTRODUODENOSCOPY (EGD) WITH PROPOFOL N/A 07/26/2020   Procedure: ESOPHAGOGASTRODUODENOSCOPY (EGD) WITH PROPOFOL;   Surgeon: Lucilla Lame, MD;  Location: [place];  Service: Endoscopy;  Laterality: N/A;  Diabetic - oral meds   LOWER EXTREMITY ANGIOGRAPHY Left 09/29/2018   Procedure: LOWER EXTREMITY ANGIOGRAPHY;  Surgeon: Algernon Huxley, MD;  Location: [place] CV LAB;  Service: Cardiovascular;  Laterality: Left;   LUNG BIOPSY  05 15 2013   has lung "spots"   PERIPHERAL VASCULAR CATHETERIZATION Left 06/01/2016   Procedure: Lower Extremity Angiography;  Surgeon: Algernon Huxley, MD;  Location: [place] CV [facility];  Service: Cardiovascular;  Laterality: Left;   PERIPHERAL VASCULAR CATHETERIZATION N/A 06/01/2016   Procedure: Abdominal Aortogram w/Lower Extremity;  Surgeon: Algernon Huxley, MD;  Location: [place] [facility];  Service: Cardiovascular;  Laterality: N/A;   PERIPHERAL VASCULAR CATHETERIZATION  06/01/2016   Procedure: Lower Extremity Intervention;  Surgeon: Algernon Huxley, MD;  Location: [place] [facility];  Service: Cardiovascular;;   PERIPHERAL VASCULAR CATHETERIZATION Right 06/08/2016   Procedure: Lower Extremity Angiography;  Surgeon: Algernon Huxley, MD;  Location: [place] [facility];  Service: Cardiovascular;  Laterality: Right;   PERIPHERAL VASCULAR CATHETERIZATION  06/08/2016   Procedure: Lower Extremity Intervention;  Surgeon: Algernon Huxley, MD;  Location: [place] [facility];  Service: Cardiovascular;;   SUBMANDIBULAR GLAND EXCISION Left 12/06/2020   Procedure: EXCISION SUBMANDIBULAR GLAND;  Surgeon: Beverly Gust, MD;  Location: [place];  Service: ENT;  Laterality: Left;  needs to be first case Diabetic - diet controlled    Prior to Admission medications   Medication Sig Start Date End Date Taking? Authorizing Provider  Accu-Chek Softclix Lancets lancets Use as instructed for twice a daily Dx E11.65 04/11/21   [name], MD  albuterol (PROVENTIL) (2.5 MG/3ML) 0.083% nebulizer solution USE 1 VIAL IN NEBULIZER EVERY 6 HOURS AS NEEDED FOR WHEEZING 04/29/21   [name],  MD  albuterol (VENTOLIN HFA) 108 (90 Base) MCG/ACT inhaler Inhale 2 puffs into the lungs every 6 (six) hours as needed for wheezing or shortness of breath. 12/11/20   [name], MD  Blood Glucose Monitoring Suppl (ACCU-CHEK GUIDE) w/Device KIT Use as directed Dx e11.65 04/11/21   [name], MD  clopidogrel (PLAVIX) 75 MG tablet Take 1 tablet (75 mg total) by mouth daily. 12/24/20   [name], MD  ferrous sulfate 324 MG TBEC Take 324 mg by mouth.    [provider]  Fluticasone-Umeclidin-Vilant (TRELEGY ELLIPTA) 100-62.5-25 MCG/INH AEPB Inhale 1 puff into the lungs daily. 03/10/21   Jonetta Osgood, NP  gabapentin (NEURONTIN) 100 MG capsule Take 1 capsule (100 mg total) by mouth 2 (two) times daily. 04/08/21   Jonetta Osgood, NP  glucose blood (ACCU-CHEK GUIDE) test strip Use as instructed twice a daily DX E11.65 04/11/21   [name], MD  insulin detemir (LEVEMIR FLEXTOUCH) 100 UNIT/ML FlexPen Inject 8- 12 units with supper qd// with needles 04/22/21   [name], MD  lisinopril-hydrochlorothiazide (ZESTORETIC) 10-12.5 MG tablet Take 1 tablet by mouth daily. 09/20/20   [name], MD  meloxicam (  MOBIC) 7.5 MG tablet Take 1 tablet (7.5 mg total) by mouth 2 (two) times daily as needed for pain. Home med. 04/08/21   Jonetta Osgood, NP  metFORMIN (GLUCOPHAGE) 500 MG tablet Take 1 tablet (500 mg total) by mouth daily with breakfast. 03/12/21   [name], MD  nitrofurantoin, macrocrystal-monohydrate, (MACROBID) 100 MG capsule Use as instructed Patient taking differently: Use as instructed twice a day for 10 day 04/22/21   [name], MD  potassium chloride (KLOR-CON) 10 MEQ tablet TAKE 1 TABLET BY MOUTH EVERY OTHER DAY 04/29/21   [name], MD    Allergies Patient has no known allergies.  Family History  Problem Relation Age of Onset   Lung cancer Father    Diabetes Mother    Hypercholesterolemia Mother    Diabetes Sister    Diabetes Maternal Grandmother     Diabetes Paternal Grandmother    Diabetes Sister    Heart attack Brother    Coronary artery disease Brother    Vascular Disease Brother    Hypertension Sister    Heart attack Brother     Social History Social History   Tobacco Use   Smoking status: Former    Packs/day: 1.00    Years: 37.00    Pack years: 37.00    Types: Cigarettes    Quit date: 02/06/2010    Years since quitting: 11.2   Smokeless tobacco: Former    Types: Snuff  Vaping Use   Vaping Use: Never used  Substance Use Topics   Alcohol use: Not Currently    Alcohol/week: 5.0 standard drinks    Types: 5 Cans of beer per week    Comment: /h x of alcohol abuse -stopped 2012- now drinks 5 beer per w   Drug use: Not Currently    Types: Marijuana, "Crack" cocaine, Cocaine    Comment: hx of cocaine use- last use 2015; last use sat marijuana6/22/19,     Review of Systems Constitutional: No fever/chills Eyes: No visual changes. ENT: No sore throat. Cardiovascular: Positive for chest tightness.  Respiratory: Positive for shortness of breath. Gastrointestinal: No abdominal pain.  No nausea, no vomiting.  No diarrhea.   Genitourinary: Negative for dysuria. Musculoskeletal: Negative for back pain. Skin: Negative for rash. Neurological: Negative for headaches, focal weakness or numbness.  ____________________________________________   PHYSICAL EXAM:  VITAL SIGNS: ED Triage Vitals  Enc Vitals Group     BP 04/30/21 2117 (!) 120/100     Pulse Rate 04/30/21 2117 (!) 103     Resp 04/30/21 2117 18     Temp 04/30/21 2117 97.8 F (36.6 C)     Temp Source 04/30/21 2117 Oral     SpO2 04/30/21 2117 99 %     Weight 04/30/21 2121 112 lb (50.8 kg)     Height 04/30/21 2121 '4\' 11"'  (1.499 m)     Head Circumference --      Peak Flow --      Pain Score 04/30/21 2121 0   Constitutional: Alert and oriented.  Eyes: Conjunctivae are normal.  ENT      Head: Normocephalic and atraumatic.      Nose: No congestion/rhinnorhea.       Mouth/Throat: Mucous membranes are moist.      Neck: No stridor. Hematological/Lymphatic/Immunilogical: No cervical lymphadenopathy. Cardiovascular: Tachycardia, regular rhythm.  No murmurs, rubs, or gallops. Respiratory: Increased respiratory effort and rate. Diffuse expiratory wheezing.  Gastrointestinal: Soft and non tender. No rebound. No guarding.  Genitourinary:  Deferred Musculoskeletal: Normal range of motion in all extremities. No lower extremity edema. Neurologic:  Normal speech and language. No gross focal neurologic deficits are appreciated.  Skin:  Skin is warm, dry and intact. No rash noted. Psychiatric: Mood and affect are normal. Speech and behavior are normal. Patient exhibits appropriate insight and judgment.  ____________________________________________    LABS (pertinent positives/negatives)  CBC wbc 6.2, hgb 10.1, plt 273 BMP na 134, k 3.6, glu 174, cr 0.60 Trop hs 35 ____________________________________________   EKG  I, Nance Pear, attending physician, personally viewed and interpreted this EKG  EKG Time: 2141 Rate: 106 Rhythm: sinus tachycardia Axis: normal Intervals: qtc 485 QRS: LBBB ST changes: no st elevation Impression: abnormal ekg ____________________________________________    RADIOLOGY  CXR Mild right basilar infiltrate.   ____________________________________________   PROCEDURES  Procedures  ____________________________________________   INITIAL IMPRESSION / ASSESSMENT AND PLAN / ED COURSE  Pertinent labs & imaging results that were available during my care of the patient were reviewed by me and considered in my medical decision making (see chart for details).   Patient presented to the emergency department today because of concern for shortness of breath and increased oxygen requirement. On exam patient does have some increased work of breathing and wheezing. Patient with history of COPD so did have concern for  exacerbation. CXR however alos concerning for infiltrate. Given hospitalization last month will plan on treating for HCAP. Discussed findings and plan with patient.   ____________________________________________   FINAL CLINICAL IMPRESSION(S) / ED DIAGNOSES  Final diagnoses:  COPD exacerbation ([place])  Shortness of breath     Note: This dictation was prepared with Dragon dictation. Any transcriptional errors that result from this process are unintentional     Nance Pear, MD 04/30/21 2311

## 2021-04-30 NOTE — ED Triage Notes (Signed)
Pt to ED via [facility] with complaints of SHOB. She is on 2.5L at home. Has history of COPD. She would drop down from 88% on 4L [place] for EMS at scene when they had her walk down the hall

## 2021-04-30 NOTE — H&P (Signed)
History and Physical    PLEASE NOTE THAT DRAGON DICTATION SOFTWARE WAS USED IN THE CONSTRUCTION OF THIS NOTE.   DAYANI WINBUSH GYJ:[id number] DOB: Apr 09, 1956 DOA: 04/30/2021  PCP: [name], MD Patient coming from: home   I have personally briefly reviewed patient's old medical records in [place]  Chief Complaint: Shortness of breath  HPI: SHAUN ZUCCARO is a 65 y.o. female with medical history significant for chronic hypoxic respiratory failure on 2 L continuous nasal cannula, COPD, type 2 diabetes mellitus, hypertension, chronic systolic heart failure, who is admitted to [facility] on 04/30/2021 with acute COPD exacerbation after presenting from home to [facility] ED complaining of shortness of breath.   The patient reports 3 to 4 days progressive shortness of breath associated with new onset nonproductive cough as well as new onset wheezing.  She also notes associated subjective fever over that timeframe, in the absence of any associated chills, rigors, or generalized myalgias.  Not associate with any orthopnea, PND, or new onset peripheral edema. No recent chest pain, diaphoresis, palpitations, N/V, pre-syncope, or syncope.  No recent hemoptysis, new lower extremity erythema, or calf tenderness.  Denies any recent trauma, travel, surgical procedures.  Aside from hospitalization at [facility] in mid August 2022 for acute COPD exacerbation, the patient denies any additional recent periods of prolonged diminished ambulatory status.  No recent melena or hematochezia. No recent headache, neck stiffness, rhinitis, rhinorrhea, sore throat, abdominal pain, diarrhea, or rash. No known recent COVID-19 exposures. Denies dysuria, gross hematuria, or change in urinary urgency/frequency.   The patient confirms a history of chronic hypoxic respiratory failure for which she is on 2 L continuous nasal cannula in the setting of severe COPD.  Over the last 3 days, in the setting of progressive  shortness of breath, the patient conveys that she increased her rate of supplemental oxygen at home from 2 L to 3 L, but noted no significant improvement in her associated shortness of breath.  She reports good compliance with her outpatient respiratory regimen which includes Trelegy as well as as needed albuterol inhaler.  She confirms that she is a former smoker, having completely quit smoking a little more than 10 years ago after smoking 1 pack/day over the preceding approximately 40 years.  She also has a documented history of chronic systolic heart failure, most recent echocardiogram on 04/03/2021 notable for LVEF 45 to 50%, global hypokinesis of the left ventricle, indeterminate diastolic parameters, no evidence of significant valvular pathology.  She is on daily HCTZ, but otherwise not on any scheduled diuretic medications at home.     ED Course:  Vital signs in the ED were notable for the following: Temperature max 97.8, heart rate 95-1 03; blood pressure 123/94; respiratory rate 18-22, oxygen saturation 99 to 100% on 3 L nasal cannula.  Labs were notable for the following: BMP notable for the following: Sodium 134, which corrects to 135 when taking into account concomitant mild hyperglycemia, bicarbonate 27, creatinine 0.6, glucose 174.  CBC notable for white blood cell count 6200.  High-sensitivity troponin I noted to be 35, which is in comparison to range of high-sensitivity troponin I during August 2022 hospitalization at which troponin was noted to be between 21-32, with troponin of 32 on 03/31/2021, with most recent prior value of 22 on 04/01/2021.  COVID-19/influenza PCR performed in the ED today and found to be negative.  Imaging and additional notable ED work-up: EKG, in comparison to most recent prior from  04/07/2017, showed sinus tachycardia with heart rate 106, left bundle branch block, QRS 141, QTc 485, and no evidence of interval T wave or ST changes.  2 view chest x-ray compared to  similar on 03/31/2021 showed interval development of right basilar infiltrate concerning for pneumonia, will otherwise showing no evidence of acute cardiopulmonary process, including no evidence of edema, effusion, or pneumothorax.  While in the ED, the following were administered: Duo nebulizer treatment x1, Solu-Medrol 125 mg IV x1, cefepime, IV vancomycin.     Review of Systems: As per HPI otherwise 10 point review of systems negative.   Past Medical History:  Diagnosis Date   Anemia    Arthritis    Asthma    Atherosclerosis of native arteries of extremity with intermittent claudication ([place]) 05/26/2016   Cancer ([place]) 2012   Right Lung CA   COPD (chronic obstructive pulmonary disease) ([place])    Depression    Diabetes mellitus without complication ([place])    Patient takes Janumet   Essential hypertension 05/26/2016   Hypercholesteremia    Oxygen dependent    2L at nite    PAD (peripheral artery disease) ([place]) 06/22/2016   Peripheral vascular disease (HCC)    Personal history of radiation therapy    Shortness of breath dyspnea    with exertion    Sialolithiasis    Sleep apnea    Wears dentures    full upper and lower    Past Surgical History:  Procedure Laterality Date   CESAREAN SECTION     x3   COLONOSCOPY WITH PROPOFOL N/A 06/25/2015   Procedure: COLONOSCOPY WITH PROPOFOL;  Surgeon: Lucilla Lame, MD;  Location: [facility];  Service: Endoscopy;  Laterality: N/A;   COLONOSCOPY WITH PROPOFOL N/A 07/26/2020   Procedure: COLONOSCOPY WITH PROPOFOL;  Surgeon: Lucilla Lame, MD;  Location: [place];  Service: Endoscopy;  Laterality: N/A;   CYST REMOVAL LEG     and on shoulder    ESOPHAGOGASTRODUODENOSCOPY (EGD) WITH PROPOFOL N/A 07/26/2020   Procedure: ESOPHAGOGASTRODUODENOSCOPY (EGD) WITH PROPOFOL;  Surgeon: Lucilla Lame, MD;  Location: [place];  Service: Endoscopy;  Laterality: N/A;  Diabetic - oral meds   LOWER EXTREMITY ANGIOGRAPHY Left 09/29/2018    Procedure: LOWER EXTREMITY ANGIOGRAPHY;  Surgeon: Algernon Huxley, MD;  Location: [place] [facility];  Service: Cardiovascular;  Laterality: Left;   LUNG BIOPSY  05 15 2013   has lung "spots"   PERIPHERAL VASCULAR CATHETERIZATION Left 06/01/2016   Procedure: Lower Extremity Angiography;  Surgeon: Algernon Huxley, MD;  Location: [place] [facility];  Service: Cardiovascular;  Laterality: Left;   PERIPHERAL VASCULAR CATHETERIZATION N/A 06/01/2016   Procedure: Abdominal Aortogram w/Lower Extremity;  Surgeon: Algernon Huxley, MD;  Location: [place] [facility];  Service: Cardiovascular;  Laterality: N/A;   PERIPHERAL VASCULAR CATHETERIZATION  06/01/2016   Procedure: Lower Extremity Intervention;  Surgeon: Algernon Huxley, MD;  Location: [place] [facility];  Service: Cardiovascular;;   PERIPHERAL VASCULAR CATHETERIZATION Right 06/08/2016   Procedure: Lower Extremity Angiography;  Surgeon: Algernon Huxley, MD;  Location: [place] [facility];  Service: Cardiovascular;  Laterality: Right;   PERIPHERAL VASCULAR CATHETERIZATION  06/08/2016   Procedure: Lower Extremity Intervention;  Surgeon: Algernon Huxley, MD;  Location: [place] [facility];  Service: Cardiovascular;;   SUBMANDIBULAR GLAND EXCISION Left 12/06/2020   Procedure: EXCISION SUBMANDIBULAR GLAND;  Surgeon: Beverly Gust, MD;  Location: [place];  Service: ENT;  Laterality: Left;  needs to be first  case Diabetic - diet controlled    Social History:  reports that she quit smoking about 11 years ago. Her smoking use included cigarettes. She has a 37.00 pack-year smoking history. She has quit using smokeless tobacco.  Her smokeless tobacco use included snuff. She reports that she does not currently use alcohol after a past usage of about 5.0 standard drinks per week. She reports that she does not currently use drugs after having used the following drugs: Marijuana, "Crack" cocaine, and Cocaine.   No Known Allergies  Family History  Problem  Relation Age of Onset   Lung cancer Father    Diabetes Mother    Hypercholesterolemia Mother    Diabetes Sister    Diabetes Maternal Grandmother    Diabetes Paternal Grandmother    Diabetes Sister    Heart attack Brother    Coronary artery disease Brother    Vascular Disease Brother    Hypertension Sister    Heart attack Brother     Family history reviewed and not pertinent    Prior to Admission medications   Medication Sig Start Date End Date Taking? Authorizing Provider  Accu-Chek Softclix Lancets lancets Use as instructed for twice a daily Dx E11.65 04/11/21   [name], MD  albuterol (PROVENTIL) (2.5 MG/3ML) 0.083% nebulizer solution USE 1 VIAL IN NEBULIZER EVERY 6 HOURS AS NEEDED FOR WHEEZING 04/29/21   [name], MD  albuterol (VENTOLIN HFA) 108 (90 Base) MCG/ACT inhaler Inhale 2 puffs into the lungs every 6 (six) hours as needed for wheezing or shortness of breath. 12/11/20   [name], MD  Blood Glucose Monitoring Suppl (ACCU-CHEK GUIDE) w/Device KIT Use as directed Dx e11.65 04/11/21   [name], MD  clopidogrel (PLAVIX) 75 MG tablet Take 1 tablet (75 mg total) by mouth daily. 12/24/20   [name], MD  ferrous sulfate 324 MG TBEC Take 324 mg by mouth.    [provider]  Fluticasone-Umeclidin-Vilant (TRELEGY ELLIPTA) 100-62.5-25 MCG/INH AEPB Inhale 1 puff into the lungs daily. 03/10/21   Jonetta Osgood, NP  gabapentin (NEURONTIN) 100 MG capsule Take 1 capsule (100 mg total) by mouth 2 (two) times daily. 04/08/21   Jonetta Osgood, NP  glucose blood (ACCU-CHEK GUIDE) test strip Use as instructed twice a daily DX E11.65 04/11/21   [name], MD  insulin detemir (LEVEMIR FLEXTOUCH) 100 UNIT/ML FlexPen Inject 8- 12 units with supper qd// with needles 04/22/21   [name], MD  lisinopril-hydrochlorothiazide (ZESTORETIC) 10-12.5 MG tablet Take 1 tablet by mouth daily. 09/20/20   [name], MD  meloxicam (MOBIC) 7.5 MG tablet Take 1 tablet (7.5 mg  total) by mouth 2 (two) times daily as needed for pain. Home med. 04/08/21   Jonetta Osgood, NP  metFORMIN (GLUCOPHAGE) 500 MG tablet Take 1 tablet (500 mg total) by mouth daily with breakfast. 03/12/21   [name], MD  nitrofurantoin, macrocrystal-monohydrate, (MACROBID) 100 MG capsule Use as instructed Patient taking differently: Use as instructed twice a day for 10 day 04/22/21   [name], MD  potassium chloride (KLOR-CON) 10 MEQ tablet TAKE 1 TABLET BY MOUTH EVERY OTHER DAY 04/29/21   [name], MD     Objective    Physical Exam: Vitals:   04/30/21 2117 04/30/21 2121 04/30/21 2237  BP: (!) 120/100  (!) 123/94  Pulse: (!) 103  95  Resp: 18  (!) 22  Temp: 97.8 F (36.6 C)    TempSrc:  Oral    SpO2: 99%  100%  Weight:  50.8 kg   Height:  '4\' 11"'  (1.499 m)     General: appears to be stated age; alert, oriented; mildly increased wob noted.  Skin: warm, dry, no rash Head:  AT/[place] Mouth:  Oral mucosa membranes appear moist, normal dentition Neck: supple; trachea midline Heart:  RRR; did not appreciate any M/R/G Lungs: mild bilateral expiratory wheezes noted; otherwise, CTAB, did not appreciate any  rales, or rhonchi Abdomen: + BS; soft, ND, NT Vascular: 2+ pedal pulses b/l; 2+ radial pulses b/l Extremities: no peripheral edema, no muscle wasting Neuro: strength and sensation intact in upper and lower extremities b/l    Labs on Admission: I have personally reviewed following labs and imaging studies  CBC: Recent Labs  Lab 04/30/21 2147  WBC 6.2  HGB 10.1*  HCT 31.8*  MCV 91.9  PLT 081   Basic Metabolic Panel: Recent Labs  Lab 04/30/21 2147  NA 134*  K 3.6  CL 100  CO2 27  GLUCOSE 174*  BUN 19  CREATININE 0.60  CALCIUM 8.6*   GFR: Estimated Creatinine Clearance: 47.8 mL/min (by C-G formula based on SCr of 0.6 mg/dL). Liver Function Tests: No results for input(s): AST, ALT, ALKPHOS, BILITOT, PROT, ALBUMIN in the last 168 hours. No results for  input(s): LIPASE, AMYLASE in the last 168 hours. No results for input(s): AMMONIA in the last 168 hours. Coagulation Profile: No results for input(s): INR, PROTIME in the last 168 hours. Cardiac Enzymes: No results for input(s): CKTOTAL, CKMB, CKMBINDEX, TROPONINI in the last 168 hours. BNP (last 3 results) No results for input(s): PROBNP in the last 8760 hours. HbA1C: No results for input(s): HGBA1C in the last 72 hours. CBG: No results for input(s): GLUCAP in the last 168 hours. Lipid Profile: No results for input(s): CHOL, HDL, LDLCALC, TRIG, CHOLHDL, LDLDIRECT in the last 72 hours. Thyroid Function Tests: No results for input(s): TSH, T4TOTAL, FREET4, T3FREE, THYROIDAB in the last 72 hours. Anemia Panel: No results for input(s): VITAMINB12, FOLATE, FERRITIN, TIBC, IRON, RETICCTPCT in the last 72 hours. Urine analysis:    Component Value Date/Time   COLORURINE YELLOW (A) 06/30/2020 1325   APPEARANCEUR Clear 07/29/2020 1137   LABSPEC 1.023 06/30/2020 1325   PHURINE 5.0 06/30/2020 1325   GLUCOSEU Negative 07/29/2020 1137   HGBUR NEGATIVE 06/30/2020 1325   BILIRUBINUR negative 04/22/2021 0959   BILIRUBINUR Negative 07/29/2020 1137   KETONESUR 5 (A) 06/30/2020 1325   PROTEINUR Positive (A) 04/22/2021 0959   PROTEINUR Negative 07/29/2020 1137   PROTEINUR NEGATIVE 06/30/2020 1325   UROBILINOGEN 0.2 04/22/2021 0959   NITRITE neg 04/22/2021 0959   NITRITE Negative 07/29/2020 1137   NITRITE NEGATIVE 06/30/2020 1325   LEUKOCYTESUR Negative 04/22/2021 0959   LEUKOCYTESUR Negative 07/29/2020 1137   LEUKOCYTESUR NEGATIVE 06/30/2020 1325    Radiological Exams on Admission: DG Chest 2 View  Result Date: 04/30/2021 CLINICAL DATA:  Shortness of breath. EXAM: CHEST - 2 VIEW COMPARISON:  March 31, 2021 FINDINGS: Emphysematous lung disease is seen involving the bilateral upper lobes. Stable, mild to moderate severity linear scarring and/or atelectasis is seen within the right lung base.  A mild amount of superimposed right basilar infiltrate is noted. This represents a new finding. There is no evidence of a pleural effusion or pneumothorax. The heart size and mediastinal contours are within normal limits. The visualized skeletal structures are unremarkable. IMPRESSION: 1. Stable, mild to moderate severity right basilar linear scarring and/or atelectasis. 2.  Mild superimposed right basilar infiltrate. Electronically Signed   By: Virgina Norfolk M.D.   On: 04/30/2021 21:47     EKG: Independently reviewed, with result as described above.    Assessment/Plan   PAULENA SERVAIS is a 65 y.o. female with medical history significant for chronic hypoxic respiratory failure on 2 L continuous nasal cannula, COPD, type 2 diabetes mellitus, hypertension, chronic systolic heart failure, who is admitted to [facility] on 04/30/2021 with acute COPD exacerbation after presenting from home to [facility] ED complaining of shortness of breath.    Principal Problem:   Acute exacerbation of chronic obstructive pulmonary disease (COPD) (HCC) Active Problems:   Essential hypertension   Diabetes (HCC)   Elevated troponin   SOB (shortness of breath)   HCAP (healthcare-associated pneumonia)   Chronic systolic CHF (congestive heart failure) (HCC)     #) Acute COPD exacerbation: in the context of a documented history of severe COPD with associated chronic hypoxic respiratory failure on 2 L continuous supplemental oxygen, diagnosis of acute exacerbation on the basis of 3 to 4 days of progressive shortness of breath associated with new onset wheezing and evidence of increased work of breathing. Of note, the setting of the patient's report of associated new onset subjective fever as well as radiographic finding of interval development of right basilar infiltrate, there is suspicion for contributory HCAP pneumonia, as further detailed below.  The patient reports good compliance with her home  respiratory regimen, as detailed above, and confirms that she completely quit smoking greater than 10 years ago.  Aside from interval right basilar infiltrate, present chest x-ray shows no evidence of additional acute cardiopulmonary process.  No clinical or radiographic evidence to suggest acute decompensated heart failure at this time.  Additionally, COVID-19/influenza PCR checked in the ED today were found to be negative.  Relative to her baseline supplemental oxygen requirement of 2 L continuous nasal cannula, there does not appear to be a departure from his baseline supplemental oxygen requirement.   Plan: monitor continuous pulse oxymetry. Monitor on telemetry. Solumedrol 40 mg IV Q6 hours. Scheduled duonebs q6 hours. Prn albuterol inhaler.  Further evaluation management of suspected right lower lobe pneumonia, including broad-spectrum IV antibiotics, as further detailed below.  Add on procalcitonin level.  Repeat BMP in the morning as well as CBC at that time.  Check serum magnesium and phosphorus levels.  Check VBG.  Add on BNP.      #) HCAP: Diagnosis on the basis of 3 to 4 days of progressive shortness of breath associate with new onset nonproductive cough, subjective fever, and presenting chest x-ray showing interval development of right lower lobe infiltrate concerning for pneumonia, potentially representing an provoking factor contributing to presenting acute COPD exacerbation..  In the context of hospitalization in mid August 2022, criteria met for hcap pna, warranting initiation of broad-spectrum IV antibiotics, as further detailed below.  In the absence of any associated leukocytosis or fever, SIRS criteria are not at this time for sepsis.  No evidence of hypotension in the ED.  Of note, COVID-19/influenza PCR checked today were found to be negative.  IV vancomycin and cefepime initiated in the ED today prior to collection of blood cultures.    Plan: Continue IV vancomycin and  cefepime.Check MRSA PCR, which, if negative, can consider discontinuation of IV vancomycin given the high negative predictive value for MRSA pneumonia associated with such as result.  Repeat CBC with differential in the morning.  Monitor continuous pulse oximetry.  Monitor on telemetry. Prn acetaminophen for fever.  Add on procalcitonin.  Strep pneumoniae urine antigen.  Flutter valve and incentive spirometry ordered.  Of note, pneumonia order set was utilized and placing admission orders for this patient.      #) Elevated troponin: mildly elevated initial troponin of 35, which is compared to a value 32 on 03/31/2021, representing the maximum high-sensitivity troponin I value during most recent prior hospitalization.  Suspect that this mildly elevated troponin is on the basis of supply demand mismatch in the setting of acute COPD exacerbation as well as suspected HCAP pneumonia, as opposed to representing a type I process due to acute plaque rupture.  EKG shows no evidence of acute ischemic changes, including no evidence of STEMI, and CXR showed no acute CP process, aside from aforementioned right lower lobe infiltrate concerning for pneumonia, including no evidence of pneumothorax.  Additionally, presentation is not associated with any CP.  Overall, ACS is felt to be less likely relative to type 2 supply demand mismatch, as above, but will closely monitor on telemetry overnight while treating suspected underlying acute COPD exacerbation in setting of suspected HCAP pneumonia, as further described above. Presentation is clinically less suggestive of acute PE.     Plan: Continue to trend troponin. Monitor on telemetry. PRN EKG for development of chest pain. Check serum Mg level and repeat BMP in the morning. repeat CBC in the AM. Additional evaluation and management of presenting acute COPD exacerbation suspected HCAP pneumonia, as suspected driving force behind mildly elevated troponin, as above.  If  ensuing significant elevation troponin trend, can consider resuming echocardiogram to evaluate for interval development of focal wall motion abnormalities relative to most recent prior echocardiogram from August 2022 hospitalization, as detailed above.      #) Type 2 diabetes mellitus: Documented history of such: The patient reports that she is on a sliding scale basal insulin in the form of Levemir 8 to 12 units subcu nightly.  Does not appear to be on any short acting insulin at home.  Additionally, she is on metformin.  Presenting blood sugar noted to be 174.  Plan: will hold home basal insulin for now as well as home metformin during this hospitalization.  Accu-Cheks before every meal and at bedtime with moderate dose sliding scale insulin.       #) Essential hypertension: Outpatient hypertensive regimen consists of lisinopril as well as HCTZ.  In the setting of suspected presenting infectious process in the form of HCAP pneumonia, will hold home antihypertensive medications for now.  Reevaluate timing of resumption of home lisinopril in the morning, particular in the setting of chronic systolic heart failure.   Plan: Hold home HCTZ and lisinopril overnight.  Close monitoring of symptom pressure via routine vital signs.      #) Chronic systolic heart failure: documented history of such, with most recent echocardiogram performed on 04/03/2021, which is notable for LVEF 45 to 50%, with additional details as conveyed above. No clinical or radiographic evidence to suggest acutely decompensated heart failure at this time.  Patient's home diuretic regimen reportedly consists of the following: HCTZ. Home cardiac medications also include the following: Lisinopril.   Plan: monitor strict I's & O's and daily weights. Repeat BMP in the morning. Check serum magnesium level.  In the setting of presenting suspected HCAP pneumonia, holding home HCTZ and lisinopril for now.       DVT prophylaxis:  SCDs Code Status: Full code Family Communication: Case was discussed with the  patient's daughter, who was present at bedside Disposition Plan: Per Rounding Team Consults called: none;  Admission status: Inpatient; pcu     Of note, this patient was added by me to the following Admit List/Treatment Team: armcadmits.    Of note, the Adult Admission Order Set (Multimorbid order set) was used by me in the admission process for this patient.   PLEASE NOTE THAT DRAGON DICTATION SOFTWARE WAS USED IN THE CONSTRUCTION OF THIS NOTE.   [place] Pager [phone] From [employer]  Otherwise, please contact night-coverage  [email or web address] Password [email or web address]   04/30/2021, 11:10 PM

## 2021-05-01 ENCOUNTER — Inpatient Hospital Stay: Payer: Medicare Other

## 2021-05-01 ENCOUNTER — Encounter: Payer: Self-pay | Admitting: Internal Medicine

## 2021-05-01 DIAGNOSIS — I5022 Chronic systolic (congestive) heart failure: Secondary | ICD-10-CM | POA: Diagnosis present

## 2021-05-01 DIAGNOSIS — R0602 Shortness of breath: Secondary | ICD-10-CM | POA: Diagnosis present

## 2021-05-01 DIAGNOSIS — E1165 Type 2 diabetes mellitus with hyperglycemia: Secondary | ICD-10-CM | POA: Diagnosis not present

## 2021-05-01 DIAGNOSIS — I5042 Chronic combined systolic (congestive) and diastolic (congestive) heart failure: Secondary | ICD-10-CM | POA: Diagnosis present

## 2021-05-01 DIAGNOSIS — I10 Essential (primary) hypertension: Secondary | ICD-10-CM | POA: Diagnosis not present

## 2021-05-01 DIAGNOSIS — J441 Chronic obstructive pulmonary disease with (acute) exacerbation: Secondary | ICD-10-CM | POA: Diagnosis not present

## 2021-05-01 DIAGNOSIS — J189 Pneumonia, unspecified organism: Secondary | ICD-10-CM | POA: Diagnosis present

## 2021-05-01 LAB — PHOSPHORUS: Phosphorus: 3.3 mg/dL (ref 2.5–4.6)

## 2021-05-01 LAB — CBG MONITORING, ED
Glucose-Capillary: 290 mg/dL — ABNORMAL HIGH (ref 70–99)
Glucose-Capillary: 332 mg/dL — ABNORMAL HIGH (ref 70–99)

## 2021-05-01 LAB — CBC
HCT: 32.3 % — ABNORMAL LOW (ref 36.0–46.0)
Hemoglobin: 10.4 g/dL — ABNORMAL LOW (ref 12.0–15.0)
MCH: 29.6 pg (ref 26.0–34.0)
MCHC: 32.2 g/dL (ref 30.0–36.0)
MCV: 92 fL (ref 80.0–100.0)
Platelets: 280 10*3/uL (ref 150–400)
RBC: 3.51 MIL/uL — ABNORMAL LOW (ref 3.87–5.11)
RDW: 13.5 % (ref 11.5–15.5)
WBC: 8.5 10*3/uL (ref 4.0–10.5)
nRBC: 0 % (ref 0.0–0.2)

## 2021-05-01 LAB — COMPREHENSIVE METABOLIC PANEL
ALT: 23 U/L (ref 0–44)
AST: 30 U/L (ref 15–41)
Albumin: 3.7 g/dL (ref 3.5–5.0)
Alkaline Phosphatase: 63 U/L (ref 38–126)
Anion gap: 7 (ref 5–15)
BUN: 16 mg/dL (ref 8–23)
CO2: 25 mmol/L (ref 22–32)
Calcium: 8.8 mg/dL — ABNORMAL LOW (ref 8.9–10.3)
Chloride: 101 mmol/L (ref 98–111)
Creatinine, Ser: 0.58 mg/dL (ref 0.44–1.00)
GFR, Estimated: >60 mL/min (ref >60)
Glucose, Bld: 347 mg/dL — ABNORMAL HIGH (ref 70–99)
Potassium: 4.6 mmol/L (ref 3.5–5.1)
Sodium: 133 mmol/L — ABNORMAL LOW (ref 135–145)
Total Bilirubin: 0.5 mg/dL (ref 0.3–1.2)
Total Protein: 6.9 g/dL (ref 6.5–8.1)

## 2021-05-01 LAB — GLUCOSE, CAPILLARY
Glucose-Capillary: 251 mg/dL — ABNORMAL HIGH (ref 70–99)
Glucose-Capillary: 285 mg/dL — ABNORMAL HIGH (ref 70–99)

## 2021-05-01 LAB — MAGNESIUM
Magnesium: 2 mg/dL (ref 1.7–2.4)
Magnesium: 2 mg/dL (ref 1.7–2.4)

## 2021-05-01 LAB — D-DIMER, QUANTITATIVE: D-Dimer, Quant: 2.38 ug/mL-FEU — ABNORMAL HIGH (ref 0.00–0.50)

## 2021-05-01 LAB — TROPONIN I (HIGH SENSITIVITY)
Troponin I (High Sensitivity): 29 ng/L — ABNORMAL HIGH (ref <18)
Troponin I (High Sensitivity): 43 ng/L — ABNORMAL HIGH (ref <18)

## 2021-05-01 LAB — PROCALCITONIN: Procalcitonin: <0.1 ng/mL

## 2021-05-01 LAB — BRAIN NATRIURETIC PEPTIDE
B Natriuretic Peptide: 29.3 pg/mL (ref 0.0–100.0)
B Natriuretic Peptide: 77.6 pg/mL (ref 0.0–100.0)

## 2021-05-01 MED ORDER — IOHEXOL 350 MG/ML SOLN
75.0000 mL | Freq: Once | INTRAVENOUS | Status: AC | PRN
  Administered 2021-05-01: 75 mL via INTRAVENOUS

## 2021-05-01 MED ORDER — SODIUM CHLORIDE 0.9 % IV SOLN
2.0000 g | Freq: Two times a day (BID) | INTRAVENOUS | Status: DC
  Administered 2021-05-02: 2 g via INTRAVENOUS
  Filled 2021-05-01 (×2): qty 2

## 2021-05-01 MED ORDER — CLOPIDOGREL BISULFATE 75 MG PO TABS
75.0000 mg | ORAL_TABLET | Freq: Every day | ORAL | Status: DC
  Administered 2021-05-01 – 2021-05-08 (×8): 75 mg via ORAL
  Filled 2021-05-01 (×8): qty 1

## 2021-05-01 MED ORDER — INSULIN DETEMIR 100 UNIT/ML ~~LOC~~ SOLN
10.0000 [IU] | Freq: Two times a day (BID) | SUBCUTANEOUS | Status: DC
  Administered 2021-05-01 – 2021-05-08 (×15): 10 [IU] via SUBCUTANEOUS
  Filled 2021-05-01 (×16): qty 0.1

## 2021-05-01 MED ORDER — GABAPENTIN 100 MG PO CAPS
100.0000 mg | ORAL_CAPSULE | Freq: Two times a day (BID) | ORAL | Status: DC
  Administered 2021-05-01 – 2021-05-08 (×15): 100 mg via ORAL
  Filled 2021-05-01 (×15): qty 1

## 2021-05-01 MED ORDER — FERROUS SULFATE 325 (65 FE) MG PO TABS
324.0000 mg | ORAL_TABLET | Freq: Every day | ORAL | Status: DC
  Administered 2021-05-01: 325 mg via ORAL
  Administered 2021-05-02: 324 mg via ORAL
  Filled 2021-05-01 (×2): qty 1

## 2021-05-01 MED ORDER — METHYLPREDNISOLONE SODIUM SUCC 40 MG IJ SOLR
40.0000 mg | Freq: Two times a day (BID) | INTRAMUSCULAR | Status: DC
  Administered 2021-05-01 – 2021-05-03 (×4): 40 mg via INTRAVENOUS
  Filled 2021-05-01 (×4): qty 1

## 2021-05-01 MED ORDER — ENOXAPARIN SODIUM 40 MG/0.4ML IJ SOSY
40.0000 mg | PREFILLED_SYRINGE | INTRAMUSCULAR | Status: DC
  Administered 2021-05-01 – 2021-05-08 (×8): 40 mg via SUBCUTANEOUS
  Filled 2021-05-01 (×8): qty 0.4

## 2021-05-01 MED ORDER — VANCOMYCIN HCL 750 MG/150ML IV SOLN
750.0000 mg | INTRAVENOUS | Status: DC
  Administered 2021-05-01: 750 mg via INTRAVENOUS
  Filled 2021-05-01 (×2): qty 150

## 2021-05-01 NOTE — Progress Notes (Addendum)
[facility] PROGRESS NOTE   Angela Jensen:[id number] DOB: [birth year] DOA: 04/30/2021  PCP: [name], MD  Brief History/Interval Summary: 65 y.o. female with medical history significant for chronic hypoxic respiratory failure on 2 L continuous nasal cannula, COPD, type 2 diabetes mellitus, hypertension, chronic systolic heart failure, who is admitted to [facility] on 04/30/2021 with acute COPD exacerbation after presenting from home to [facility] ED complaining of shortness of breath.    Consultants: None  Procedures: None  Antibiotics: Anti-infectives (From admission, onward)    Start     Dose/Rate Route Frequency Ordered Stop   05/02/21 1000  ceFEPIme (MAXIPIME) 2 g in sodium chloride 0.9 % 100 mL IVPB        2 g 200 mL/hr over 30 Minutes Intravenous Every 12 hours 05/01/21 0117     05/01/21 2300  vancomycin (VANCOREADY) IVPB 750 mg/150 mL        750 mg 150 mL/hr over 60 Minutes Intravenous Every 24 hours 05/01/21 0117     04/30/21 2245  vancomycin (VANCOCIN) IVPB 1000 mg/200 mL premix        1,000 mg 200 mL/hr over 60 Minutes Intravenous  Once 04/30/21 2237 05/01/21 0133   04/30/21 2245  ceFEPIme (MAXIPIME) 2 g in sodium chloride 0.9 % 100 mL IVPB        2 g 200 mL/hr over 30 Minutes Intravenous  Once 04/30/21 2237 05/01/21 0034       Subjective/Interval History: Patient mentions that she is feeling slightly better this morning.  Less short of breath compared to yesterday.  Only has a dry cough.  Denies any chest pain.  No nausea or vomiting.    Assessment/Plan:  COPD with acute exacerbation/chronic respiratory failure with hypoxia Patient with longstanding history of COPD on home oxygen at home at 2 to 3 L/min.  Noted to have wheezing when admitted.  Started on nebulizer treatments and steroids.  Seems to be doing better this morning.  Not much wheezing heard today.  Remains on her usual oxygen therapy.  Healthcare associated  pneumonia Chest x-ray raised concern for infectious process in the right lung.  She was recently hospitalized.  She is on broad-spectrum antibiotics currently.  Follow-up on cultures.  WBC noted to be normal.  Surprisingly procalcitonin level is also less than 0.1.  Will de-escalate antibiotics rapidly if she remains stable in the next 24 hours.  Influenza and COVID-19 PCR's were negative.  Mildly elevated troponin/chronic systolic CHF Very flat trend.  Likely mild demand ischemia.  Denies any anginal symptoms currently.  Do not anticipate any further testing at this time.  Recently done echocardiogram in August showed slightly diminished systolic function with a EF of 45 to 50% with global hypokinesis.  Do not see any cardiology notes in our system.  Will discuss with patient if she does follow-up with any cardiologist.  If not she may benefit from being seen by 1 in the outpatient setting.  No indication for inpatient cardiology consultation at this time. Seems to be euvolemic currently.  Diabetes mellitus type 2, uncontrolled with hyperglycemia Elevated glucose levels likely due to steroids.  Apparently on Levemir at home which was held.  Will resume.  Essential hypertension Monitor blood pressures closely.  HCTZ is currently on hold.  Lisinopril also on hold.  Normocytic anemia Hemoglobin is stable this morning.  No evidence of overt blood loss.  Continue to monitor.  Mild hyponatremia Continue to monitor.  History of peripheral  artery disease Stable.  Continue Plavix.  DVT Prophylaxis: Initiate Lovenox Code Status: Full code Family Communication: No family at bedside.  Discussed with patient Disposition Plan: Hopefully return home when improved.  Mobilize.  Status is: Inpatient  Remains inpatient appropriate because:IV treatments appropriate due to intensity of illness or inability to take PO and Inpatient level of care appropriate due to severity of illness  Dispo: The patient is  from: Home              Anticipated d/c is to: Home              Patient currently is not medically stable to d/c.   Difficult to place patient No     Medications: Scheduled:  clopidogrel  75 mg Oral Daily   ferrous sulfate  324 mg Oral Q breakfast   gabapentin  100 mg Oral BID   insulin aspart  0-9 Units Subcutaneous TID WC   ipratropium-albuterol  3 mL Nebulization Q6H   methylPREDNISolone (SOLU-MEDROL) injection  80 mg Intravenous Q12H   Continuous:  [START ON 05/02/2021] ceFEPime (MAXIPIME) IV     vancomycin     JKD:[id number] **OR** acetaminophen, albuterol   Objective:  Vital Signs  Vitals:   05/01/21 0200 05/01/21 0345 05/01/21 0400 05/01/21 0600  BP: 126/83  122/79 124/85  Pulse: (!) 50  (!) 102 68  Resp: 18 20  16   Temp: 97.8 F (36.6 C)     TempSrc: Oral     SpO2: 100%  99% 100%  Weight:      Height:        Intake/Output Summary (Last 24 hours) at 05/01/2021 0918 Last data filed at 05/01/2021 0133 Gross per 24 hour  Intake 291.16 ml  Output --  Net 291.16 ml   Filed Weights   04/30/21 2121  Weight: 50.8 kg    General appearance: Awake alert.  In no distress Resp: Mildly tachypneic.  No use of accessory muscles.  Coarse breath sounds bilaterally.  Few crackles at the bases especially on the right.  Occasional wheezing.  No rhonchi. Cardio: S1-S2 is normal regular.  No S3-S4.  No rubs murmurs or bruit GI: Abdomen is soft.  Nontender nondistended.  Bowel sounds are present normal.  No masses organomegaly Extremities: No edema.  Full range of motion of lower extremities. Neurologic: Alert and oriented x3.  No focal neurological deficits.    Lab Results:  Data Reviewed: I have personally reviewed following labs and imaging studies  CBC: Recent Labs  Lab 04/30/21 2147 05/01/21 0619  WBC 6.2 8.5  HGB 10.1* 10.4*  HCT 31.8* 32.3*  MCV 91.9 92.0  PLT 273 338    Basic Metabolic Panel: Recent Labs  Lab 04/30/21 2147 04/30/21 2341  05/01/21 0619  NA 134*  --  133*  K 3.6  --  4.6  CL 100  --  101  CO2 27  --  25  GLUCOSE 174*  --  347*  BUN 19  --  16  CREATININE 0.60  --  0.58  CALCIUM 8.6*  --  8.8*  MG  --  2.0 2.0  PHOS  --   --  3.3    GFR: Estimated Creatinine Clearance: 47.8 mL/min (by C-G formula based on SCr of 0.58 mg/dL).  Liver Function Tests: Recent Labs  Lab 05/01/21 0619  AST 30  ALT 23  ALKPHOS 63  BILITOT 0.5  PROT 6.9  ALBUMIN 3.7      CBG: Recent Labs  Lab 05/01/21 0820  GLUCAP 290*      Recent Results (from the past 240 hour(s))  CULTURE, URINE COMPREHENSIVE     Status: None   Collection Time: 04/22/21  9:56 AM   Specimen: Urine   Urine  Result Value Ref Range Status   Urine Culture, Comprehensive Final report  Final   Organism ID, Bacteria Comment  Final    Comment: Mixed urogenital flora 1,000 Colonies/mL   Resp Panel by RT-PCR (Flu A&B, Covid) Nasopharyngeal Swab     Status: None   Collection Time: 04/30/21 10:24 PM   Specimen: Nasopharyngeal Swab; Nasopharyngeal(NP) swabs in vial transport medium  Result Value Ref Range Status   SARS Coronavirus 2 by RT PCR NEGATIVE NEGATIVE Final    Comment: (NOTE) SARS-CoV-2 target nucleic acids are NOT DETECTED.  The SARS-CoV-2 RNA is generally detectable in upper respiratory specimens during the acute phase of infection. The lowest concentration of SARS-CoV-2 viral copies this assay can detect is 138 copies/mL. A negative result does not preclude SARS-Cov-2 infection and should not be used as the sole basis for treatment or other patient management decisions. A negative result may occur with  improper specimen collection/handling, submission of specimen other than nasopharyngeal swab, presence of viral mutation(s) within the areas targeted by this assay, and inadequate number of viral copies(<138 copies/mL). A negative result must be combined with clinical observations, patient history, and  epidemiological information. The expected result is Negative.  Fact Sheet for Patients:  EntrepreneurPulse.com.au  Fact Sheet for Healthcare Providers:  IncredibleEmployment.be  This test is no t yet approved or cleared by the [place] FDA and  has been authorized for detection and/or diagnosis of SARS-CoV-2 by FDA under an Emergency Use Authorization (EUA). This EUA will remain  in effect (meaning this test can be used) for the duration of the COVID-19 declaration under Section 564(b)(1) of the Act, 21 U.S.C.section 360bbb-3(b)(1), unless the authorization is terminated  or revoked sooner.       Influenza A by PCR NEGATIVE NEGATIVE Final   Influenza B by PCR NEGATIVE NEGATIVE Final    Comment: (NOTE) The Xpert Xpress SARS-CoV-2/FLU/RSV plus assay is intended as an aid in the diagnosis of influenza from Nasopharyngeal swab specimens and should not be used as a sole basis for treatment. Nasal washings and aspirates are unacceptable for Xpert Xpress SARS-CoV-2/FLU/RSV testing.  Fact Sheet for Patients: EntrepreneurPulse.com.au  Fact Sheet for Healthcare Providers: IncredibleEmployment.be  This test is not yet approved or cleared by the [place] FDA and has been authorized for detection and/or diagnosis of SARS-CoV-2 by FDA under an Emergency Use Authorization (EUA). This EUA will remain in effect (meaning this test can be used) for the duration of the COVID-19 declaration under Section 564(b)(1) of the Act, 21 U.S.C. section 360bbb-3(b)(1), unless the authorization is terminated or revoked.  Performed at [facility], [address] [place], [place] [address]       Radiology Studies: DG Chest 2 View  Result Date: 04/30/2021 CLINICAL DATA:  Shortness of breath. EXAM: CHEST - 2 VIEW COMPARISON:  March 31, 2021 FINDINGS: Emphysematous lung disease is seen involving the bilateral  upper lobes. Stable, mild to moderate severity linear scarring and/or atelectasis is seen within the right lung base. A mild amount of superimposed right basilar infiltrate is noted. This represents a new finding. There is no evidence of a pleural effusion or pneumothorax. The heart size and mediastinal contours are within normal limits. The visualized skeletal structures are unremarkable.  IMPRESSION: 1. Stable, mild to moderate severity right basilar linear scarring and/or atelectasis. 2. Mild superimposed right basilar infiltrate. Electronically Signed   By: Virgina Norfolk M.D.   On: 04/30/2021 21:47       LOS: 1 day   [facility] Pager on [email or web address]  05/01/2021, 9:18 AM

## 2021-05-01 NOTE — ED Notes (Signed)
Patient is resting comfortably, breakfast tray and remote provided. Denies further needs at this time.

## 2021-05-01 NOTE — Consult Note (Signed)
Pharmacy Antibiotic Note  Angela Jensen is a 65 y.o. female admitted on 04/30/2021 with pneumonia.  Pharmacy has been consulted for vancomycin and cefepime dosing.  Plan: Cefepime 2 gram Q12H Vancomycin 1000 mg x 1 given in ED.  Initiate Vancomycin 750 mg Q24H. Goal AUC 400-500 Estimated AUC 465/Cmin: 11.4 Scr 0.8, IBW, Vd 0.72  Follow up MRSA PCR   Height: 4\' 11"  (149.9 cm) Weight: 50.8 kg (112 lb) IBW/kg (Calculated) : 43.2  Temp (24hrs), Avg:97.8 F (36.6 C), Min:97.8 F (36.6 C), Max:97.8 F (36.6 C)  Recent Labs  Lab 04/30/21 2147  WBC 6.2  CREATININE 0.60    Estimated Creatinine Clearance: 47.8 mL/min (by C-G formula based on SCr of 0.6 mg/dL).    No Known Allergies  Antimicrobials this admission: 9/14 cefepime >>  9/15 vancomycin >>   Dose adjustments this admission:   Microbiology results: 9/15 MRSA PCR: sent  Thank you for allowing pharmacy to be a part of this patient's care.  Dorothe Pea, PharmD, BCPS Clinical Pharmacist   05/01/2021 1:18 AM

## 2021-05-01 NOTE — Progress Notes (Signed)
Cross Cover Patient with shortness of breath and acute hypoxia with tachycardia after transfer back t bed from [facility].Marland Kitchen Oxygen saturations improved with increase of supplemental oxygen to 15 L face mask from 2-3 L via nasal cannula.  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  Both were in the setting of during ambulation of short distances.  Also reported these symptoms not consistent with prior  COPD exacerbations, but more severe  SUBJECTIVE  A & O BBS diminished throughout righ, mild exp wheeze in left  BNP 78 D dimer 2.38   Chest xray  FINDINGS: Right lower lung scarring. Subpleural reticulation/fibrosis at the lung bases. No pleural effusion or pneumothorax.  The heart is normal in size.   IMPRESSION: Bilateral lower lung scarring.  No evidence of acute cardiopulmonary disease  CTA IMPRESSION: No evidence of central pulmonary embolism, noting motion degradation.   Scarring/radiation changes in the right hemithorax, unchanged.   Aortic Atherosclerosis (ICD10-I70.0) and Emphysema (ICD10-J43.9).    Recommend pulm rehab and maybe low dose xanax for episodes

## 2021-05-01 NOTE — Progress Notes (Signed)
Patient  became anxious and increasingly short of breath during change of shift report. Family member said that they were trying to get her to the bedside commode when it happened. Attached patient to [facility] @ 15L/ min. Respiratory therapist informed. Randol Kern NP paged about the situation. Vital signs taken and recorded. Patient is alert and oriented. Orders acknowledged and carried out. EKG done and has been seen by Randol Kern NP.

## 2021-05-02 ENCOUNTER — Ambulatory Visit: Payer: Medicare Other | Admitting: Radiation Oncology

## 2021-05-02 DIAGNOSIS — J441 Chronic obstructive pulmonary disease with (acute) exacerbation: Secondary | ICD-10-CM | POA: Diagnosis not present

## 2021-05-02 DIAGNOSIS — I10 Essential (primary) hypertension: Secondary | ICD-10-CM | POA: Diagnosis not present

## 2021-05-02 DIAGNOSIS — R778 Other specified abnormalities of plasma proteins: Secondary | ICD-10-CM | POA: Diagnosis not present

## 2021-05-02 DIAGNOSIS — E1165 Type 2 diabetes mellitus with hyperglycemia: Secondary | ICD-10-CM | POA: Diagnosis not present

## 2021-05-02 LAB — GLUCOSE, CAPILLARY
Glucose-Capillary: 152 mg/dL — ABNORMAL HIGH (ref 70–99)
Glucose-Capillary: 291 mg/dL — ABNORMAL HIGH (ref 70–99)
Glucose-Capillary: 294 mg/dL — ABNORMAL HIGH (ref 70–99)
Glucose-Capillary: 220 mg/dL — ABNORMAL HIGH (ref 70–99)

## 2021-05-02 LAB — CBC
HCT: 31.2 % — ABNORMAL LOW (ref 36.0–46.0)
Hemoglobin: 9.9 g/dL — ABNORMAL LOW (ref 12.0–15.0)
MCH: 29.6 pg (ref 26.0–34.0)
MCHC: 31.7 g/dL (ref 30.0–36.0)
MCV: 93.1 fL (ref 80.0–100.0)
Platelets: 272 10*3/uL (ref 150–400)
RBC: 3.35 MIL/uL — ABNORMAL LOW (ref 3.87–5.11)
RDW: 13.9 % (ref 11.5–15.5)
WBC: 9.7 10*3/uL (ref 4.0–10.5)
nRBC: 0 % (ref 0.0–0.2)

## 2021-05-02 LAB — BASIC METABOLIC PANEL
Anion gap: 4 — ABNORMAL LOW (ref 5–15)
BUN: 19 mg/dL (ref 8–23)
CO2: 31 mmol/L (ref 22–32)
Calcium: 8.6 mg/dL — ABNORMAL LOW (ref 8.9–10.3)
Chloride: 103 mmol/L (ref 98–111)
Creatinine, Ser: 0.45 mg/dL (ref 0.44–1.00)
GFR, Estimated: >60 mL/min (ref >60)
Glucose, Bld: 151 mg/dL — ABNORMAL HIGH (ref 70–99)
Potassium: 4 mmol/L (ref 3.5–5.1)
Sodium: 138 mmol/L (ref 135–145)

## 2021-05-02 LAB — RETICULOCYTES
Immature Retic Fract: 24.2 % — ABNORMAL HIGH (ref 2.3–15.9)
RBC.: 3.42 MIL/uL — ABNORMAL LOW (ref 3.87–5.11)
Retic Count, Absolute: 82.4 10*3/uL (ref 19.0–186.0)
Retic Ct Pct: 2.4 % (ref 0.4–3.1)

## 2021-05-02 LAB — FOLATE: Folate: 14.8 ng/mL (ref >5.9)

## 2021-05-02 LAB — IRON AND TIBC
Iron: 41 ug/dL (ref 28–170)
Saturation Ratios: 10 % — ABNORMAL LOW (ref 10.4–31.8)
TIBC: 395 ug/dL (ref 250–450)
UIBC: 354 ug/dL

## 2021-05-02 LAB — VITAMIN B12: Vitamin B-12: 748 pg/mL (ref 180–914)

## 2021-05-02 LAB — FERRITIN: Ferritin: 21 ng/mL (ref 11–307)

## 2021-05-02 MED ORDER — ALBUTEROL SULFATE (2.5 MG/3ML) 0.083% IN NEBU
2.5000 mg | INHALATION_SOLUTION | Freq: Three times a day (TID) | RESPIRATORY_TRACT | Status: DC
  Administered 2021-05-02 – 2021-05-08 (×18): 2.5 mg via RESPIRATORY_TRACT
  Filled 2021-05-02 (×17): qty 3

## 2021-05-02 MED ORDER — SODIUM CHLORIDE 0.9 % IV SOLN
100.0000 mg | Freq: Two times a day (BID) | INTRAVENOUS | Status: DC
  Administered 2021-05-02 – 2021-05-05 (×6): 100 mg via INTRAVENOUS
  Filled 2021-05-02 (×8): qty 100

## 2021-05-02 MED ORDER — LORAZEPAM 0.5 MG PO TABS
0.5000 mg | ORAL_TABLET | ORAL | Status: DC | PRN
  Administered 2021-05-02: 0.5 mg via ORAL
  Filled 2021-05-02: qty 1

## 2021-05-02 MED ORDER — FERROUS SULFATE 325 (65 FE) MG PO TABS
324.0000 mg | ORAL_TABLET | Freq: Two times a day (BID) | ORAL | Status: DC
  Administered 2021-05-02 – 2021-05-08 (×12): 324 mg via ORAL
  Filled 2021-05-02 (×12): qty 1

## 2021-05-02 NOTE — Significant Event (Signed)
Rapid Response Event Note   Reason for Call :  Respiratory distress  Initial Focused Assessment:  Rapid response RN came into patient's room with patient sitting up in bed surrounded by 2A nurses. Patient was having moderately increased work of breathing at this time. Per report from 2A nurses, patient had been getting ready to go to the bathroom and became acutely short of breath. Oxygen saturations did not drop the whole time but due to increased work of breathing patient placed on 15 L non rebreather. Merrilee Seashore RT arrived shortly after this RN and stated that similar event happened last night around same type of activity. CT chest already done and negative for PE. Vital signs upon this RN's arrival had HR 120s, RR mid 30s but improving as rapid continued. Oxygen saturations 100% on non rebreather.  Interventions:  None by this RN. Merrilee Seashore RT brought breathing treatment with him and getting set up for that. Patient transitioned to 4L nasal cannula due to improving work of breathing. RT and patient's RN had no needs from rapid response RN at this time. As rapid response RN left, RT was setting up for breathing treatment.  Plan of Care:  Patient to stay on 2A for now. Patient's nurse discussing    Event Summary:    Call Time: 18:49 Arrival Time: 18:51 End Time: 18:58  Tia Hieronymus, Jaynie Bream, RN

## 2021-05-02 NOTE — Progress Notes (Signed)
[facility] PROGRESS NOTE   Angela Jensen OBS:[id number] DOB: [birth year] DOA: 04/30/2021  PCP: [name], MD  Brief History/Interval Summary: 65 y.o. female with medical history significant for chronic hypoxic respiratory failure on 2 L continuous nasal cannula, COPD, type 2 diabetes mellitus, hypertension, chronic systolic heart failure, who is admitted to [facility] on 04/30/2021 with acute COPD exacerbation after presenting from home to [facility] ED complaining of shortness of breath.    Consultants: None  Procedures: None  Antibiotics: Anti-infectives (From admission, onward)    Start     Dose/Rate Route Frequency Ordered Stop   05/02/21 1000  ceFEPIme (MAXIPIME) 2 g in sodium chloride 0.9 % 100 mL IVPB        2 g 200 mL/hr over 30 Minutes Intravenous Every 12 hours 05/01/21 0117     05/01/21 2300  vancomycin (VANCOREADY) IVPB 750 mg/150 mL        750 mg 150 mL/hr over 60 Minutes Intravenous Every 24 hours 05/01/21 0117     04/30/21 2245  vancomycin (VANCOCIN) IVPB 1000 mg/200 mL premix        1,000 mg 200 mL/hr over 60 Minutes Intravenous  Once 04/30/21 2237 05/01/21 0133   04/30/21 2245  ceFEPIme (MAXIPIME) 2 g in sodium chloride 0.9 % 100 mL IVPB        2 g 200 mL/hr over 30 Minutes Intravenous  Once 04/30/21 2237 05/01/21 0034       Subjective/Interval History: Overnight events noted.  Patient feels like she is better this morning.  Denies any chest pain.  Shortness of breath is about the same as yesterday.   Assessment/Plan:  COPD with acute exacerbation/chronic respiratory failure with hypoxia Patient with longstanding history of COPD on home oxygen at home at 2 to 3 L/min.  Noted to have wheezing when admitted.  Started on nebulizer treatments and steroids.  Overnight events noted.  She underwent a CT angiogram which did not show any PE.  Back on 4 L of oxygen by nasal cannula.  Required nonrebreather overnight.  Saturations noted in  the 90s.  Continue current treatment for now.   Healthcare associated pneumonia Chest x-ray raised concern for infectious process in the right lung.  She was recently hospitalized.   Patient placed on broad-spectrum antibiotics with vancomycin and cefepime.  Unfortunately no cultures were sent from the hospital.  Procalcitonin was less than 0.1.  ET angiogram done overnight does not show any clear-cut infiltrates.  Chronic scarring is noted.  We will change her to doxycycline.  Influenza and COVID-19 PCR's were negative.  Mildly elevated troponin/chronic systolic CHF Very flat trend.  Likely mild demand ischemia.  Denies any anginal symptoms currently.  Do not anticipate any further testing at this time.  Recently done echocardiogram in August showed slightly diminished systolic function with a EF of 45 to 50% with global hypokinesis.  Do not see any cardiology notes in our system.   She will benefit from being seen by cardiologist in the outpatient setting.  No clear indication for inpatient consultation at this time.    Diabetes mellitus type 2, uncontrolled with hyperglycemia Elevated glucose levels likely due to steroids.  Levemir was resumed yesterday.  Continue to monitor.  Essential hypertension Blood pressure is reasonably well controlled.  Lisinopril and HCTZ on hold.    Normocytic anemia Hemoglobin stable for the most part.  Anemia panel reviewed.  B12 level is pending.  Folic acid 47.6, ferritin 21, TIBC 395,  percent saturation 10.  Could suggest iron deficiency.  May benefit from iron supplementation.  May need further work-up in the outpatient setting.  No overt blood loss noted.  Mild hyponatremia Continue to monitor.  History of peripheral artery disease Stable.  Continue Plavix.  DVT Prophylaxis: Initiate Lovenox Code Status: Full code Family Communication: No family at bedside.  Discussed with patient Disposition Plan: Hopefully return home when improved.   Mobilize.  Status is: Inpatient  Remains inpatient appropriate because:IV treatments appropriate due to intensity of illness or inability to take PO and Inpatient level of care appropriate due to severity of illness  Dispo: The patient is from: Home              Anticipated d/c is to: Home              Patient currently is not medically stable to d/c.   Difficult to place patient No     Medications: Scheduled:  albuterol  2.5 mg Nebulization TID   clopidogrel  75 mg Oral Daily   enoxaparin (LOVENOX) injection  40 mg Subcutaneous Q24H   ferrous sulfate  324 mg Oral Q breakfast   gabapentin  100 mg Oral BID   insulin aspart  0-9 Units Subcutaneous TID WC   insulin detemir  10 Units Subcutaneous BID   methylPREDNISolone (SOLU-MEDROL) injection  40 mg Intravenous Q12H   Continuous:  ceFEPime (MAXIPIME) IV 2 g (05/02/21 0856)   vancomycin Stopped (05/02/21 0000)   FOY:DXAJOINOMVEHM **OR** acetaminophen, albuterol   Objective:  Vital Signs  Vitals:   05/02/21 0500 05/02/21 0720 05/02/21 0742 05/02/21 1114  BP:  129/87  (!) 141/89  Pulse:  88  95  Resp:  17  17  Temp:  98.5 F (36.9 C)  98.3 F (36.8 C)  TempSrc:  Oral    SpO2:  100% 100% 100%  Weight: 52.6 kg     Height:        Intake/Output Summary (Last 24 hours) at 05/02/2021 1215 Last data filed at 05/02/2021 1122 Gross per 24 hour  Intake 1230 ml  Output 1500 ml  Net -270 ml    Filed Weights   04/30/21 2121 05/01/21 1505 05/02/21 0500  Weight: 50.8 kg 52.8 kg 52.6 kg    General appearance: Awake alert.  In no distress Resp: Noted to be mildly tachypneic without use of accessory muscles.  Coarse breath sounds with crackles in the bases.  No wheezing or rhonchi. Cardio: S1-S2 is normal regular.  No S3-S4.  No rubs murmurs or bruit GI: Abdomen is soft.  Nontender nondistended.  Bowel sounds are present normal.  No masses organomegaly Extremities: No edema.  Full range of motion of lower  extremities. Neurologic: Alert and oriented x3.  No focal neurological deficits.     Lab Results:  Data Reviewed: I have personally reviewed following labs and imaging studies  CBC: Recent Labs  Lab 04/30/21 2147 05/01/21 0619 05/02/21 0758  WBC 6.2 8.5 9.7  HGB 10.1* 10.4* 9.9*  HCT 31.8* 32.3* 31.2*  MCV 91.9 92.0 93.1  PLT 273 280 272     Basic Metabolic Panel: Recent Labs  Lab 04/30/21 2147 04/30/21 2341 05/01/21 0619 05/02/21 0758  NA 134*  --  133* 138  K 3.6  --  4.6 4.0  CL 100  --  101 103  CO2 27  --  25 31  GLUCOSE 174*  --  347* 151*  BUN 19  --  16 19  CREATININE 0.60  --  0.58 0.45  CALCIUM 8.6*  --  8.8* 8.6*  MG  --  2.0 2.0  --   PHOS  --   --  3.3  --      GFR: Estimated Creatinine Clearance: 52 mL/min (by C-G formula based on SCr of 0.45 mg/dL).  Liver Function Tests: Recent Labs  Lab 05/01/21 0619  AST 30  ALT 23  ALKPHOS 63  BILITOT 0.5  PROT 6.9  ALBUMIN 3.7       CBG: Recent Labs  Lab 05/01/21 1157 05/01/21 1643 05/01/21 2131 05/02/21 0723 05/02/21 1115  GLUCAP 332* 251* 285* 152* 291*       Recent Results (from the past 240 hour(s))  Resp Panel by RT-PCR (Flu A&B, Covid) Nasopharyngeal Swab     Status: None   Collection Time: 04/30/21 10:24 PM   Specimen: Nasopharyngeal Swab; Nasopharyngeal(NP) swabs in vial transport medium  Result Value Ref Range Status   SARS Coronavirus 2 by RT PCR NEGATIVE NEGATIVE Final    Comment: (NOTE) SARS-CoV-2 target nucleic acids are NOT DETECTED.  The SARS-CoV-2 RNA is generally detectable in upper respiratory specimens during the acute phase of infection. The lowest concentration of SARS-CoV-2 viral copies this assay can detect is 138 copies/mL. A negative result does not preclude SARS-Cov-2 infection and should not be used as the sole basis for treatment or other patient management decisions. A negative result may occur with  improper specimen collection/handling,  submission of specimen other than nasopharyngeal swab, presence of viral mutation(s) within the areas targeted by this assay, and inadequate number of viral copies(<138 copies/mL). A negative result must be combined with clinical observations, patient history, and epidemiological information. The expected result is Negative.  Fact Sheet for Patients:  EntrepreneurPulse.com.au  Fact Sheet for Healthcare Providers:  IncredibleEmployment.be  This test is no t yet approved or cleared by the [place] FDA and  has been authorized for detection and/or diagnosis of SARS-CoV-2 by FDA under an Emergency Use Authorization (EUA). This EUA will remain  in effect (meaning this test can be used) for the duration of the COVID-19 declaration under Section 564(b)(1) of the Act, 21 U.S.C.section 360bbb-3(b)(1), unless the authorization is terminated  or revoked sooner.       Influenza A by PCR NEGATIVE NEGATIVE Final   Influenza B by PCR NEGATIVE NEGATIVE Final    Comment: (NOTE) The Xpert Xpress SARS-CoV-2/FLU/RSV plus assay is intended as an aid in the diagnosis of influenza from Nasopharyngeal swab specimens and should not be used as a sole basis for treatment. Nasal washings and aspirates are unacceptable for Xpert Xpress SARS-CoV-2/FLU/RSV testing.  Fact Sheet for Patients: EntrepreneurPulse.com.au  Fact Sheet for Healthcare Providers: IncredibleEmployment.be  This test is not yet approved or cleared by the [place] FDA and has been authorized for detection and/or diagnosis of SARS-CoV-2 by FDA under an Emergency Use Authorization (EUA). This EUA will remain in effect (meaning this test can be used) for the duration of the COVID-19 declaration under Section 564(b)(1) of the Act, 21 U.S.C. section 360bbb-3(b)(1), unless the authorization is terminated or revoked.  Performed at [facility], [address] [place], [place] [address]        Radiology Studies: DG Chest 2 View  Result Date: 04/30/2021 CLINICAL DATA:  Shortness of breath. EXAM: CHEST - 2 VIEW COMPARISON:  March 31, 2021 FINDINGS: Emphysematous lung disease is seen involving the bilateral upper lobes. Stable, mild to moderate severity linear scarring  and/or atelectasis is seen within the right lung base. A mild amount of superimposed right basilar infiltrate is noted. This represents a new finding. There is no evidence of a pleural effusion or pneumothorax. The heart size and mediastinal contours are within normal limits. The visualized skeletal structures are unremarkable. IMPRESSION: 1. Stable, mild to moderate severity right basilar linear scarring and/or atelectasis. 2. Mild superimposed right basilar infiltrate. Electronically Signed   By: Virgina Norfolk M.D.   On: 04/30/2021 21:47   CT Angio Chest Pulmonary Embolism (PE) W or WO Contrast  Result Date: 05/02/2021 CLINICAL DATA:  Shortness of breath EXAM: CT ANGIOGRAPHY CHEST WITH CONTRAST TECHNIQUE: Multidetector CT imaging of the chest was performed using the standard protocol during bolus administration of intravenous contrast. Multiplanar CT image reconstructions and MIPs were obtained to evaluate the vascular anatomy. CONTRAST:  72mL OMNIPAQUE IOHEXOL 350 MG/ML SOLN COMPARISON:  CT chest dated 03/31/2021 FINDINGS: Cardiovascular: Satisfactory opacification the bilateral pulmonary arteries to the lobar level. However, evaluation is constrained by respiratory motion, particularly in the lower lobes. Within that constraint, there is no convincing evidence of pulmonary embolism. Suspected artifact within a segmental right lower lobe pulmonary artery (series 8/image 167) in an area of motion degradation. Although not tailored for evaluation of the thoracic aorta, there is no evidence of thoracic aortic aneurysm or dissection. Atherosclerotic calcifications of the aortic  arch. Heart is normal in size.  No pericardial effusion. Mediastinum/Nodes: No suspicious mediastinal lymphadenopathy. Visualized thyroid is unremarkable. Lungs/Pleura: Scarring in the right lung apex (series 9/image 16). Platelike scarring in the inferior right middle lobe (series 9/image 46). Scarring/radiation changes in the right lower lobe (series 9/image 60). These findings are unchanged from the prior. Moderate centrilobular and paraseptal emphysematous changes, upper lung predominant. No suspicious pulmonary nodules. Mild bibasilar atelectasis. No pleural effusion or pneumothorax. Upper Abdomen: Visualized upper abdomen is grossly unremarkable. Musculoskeletal: Post treatment changes involving the right lateral 7th rib (series 7/image 48), unchanged. Mild degenerative changes of the lower thoracic spine. Review of the MIP images confirms the above findings. IMPRESSION: No evidence of central pulmonary embolism, noting motion degradation. Scarring/radiation changes in the right hemithorax, unchanged. Aortic Atherosclerosis (ICD10-I70.0) and Emphysema (ICD10-J43.9). Electronically Signed   By: Julian Hy M.D.   On: 05/02/2021 00:13   DG Chest Port 1 View  Result Date: 05/01/2021 CLINICAL DATA:  Hypoxia EXAM: PORTABLE CHEST 1 VIEW COMPARISON:  04/30/2021 FINDINGS: Right lower lung scarring. Subpleural reticulation/fibrosis at the lung bases. No pleural effusion or pneumothorax. The heart is normal in size. IMPRESSION: Bilateral lower lung scarring. No evidence of acute cardiopulmonary disease. Electronically Signed   By: Julian Hy M.D.   On: 05/01/2021 21:15       LOS: 2 days   [facility] Pager on [email or web address]  05/02/2021, 12:15 PM

## 2021-05-02 NOTE — Evaluation (Signed)
Physical Therapy Evaluation Patient Details Name: Angela Jensen MRN: [id number] DOB: 10-Jul-1956 Today's Date: 05/02/2021  History of Present Illness  Angela Jensen is a 65 y.o. female with medical history significant for chronic hypoxic respiratory failure on 2 L continuous nasal cannula, COPD, type 2 diabetes mellitus, hypertension, chronic systolic heart failure, who is admitted to [facility] on 04/30/2021 with acute COPD exacerbation after presenting from home to [facility] ED complaining of shortness of breath.   Clinical Impression  Pt admitted with above diagnosis. Pt received upright in bed agreeable to PT services. Resting on 3L/min with resting HR trending at 110-111 BPM and SPO2 to 98%. Pt able to report PLOF, DME, home layout without difficulty. Pt indep with all ADL's/IADL's in household reporting she rides in car with husband for community tasks. Mod-I with bed mobility to transfer from supine to seated EOB. Able to independently don socks bilaterally in sitting. Standing with supervision, pt ambulating ~150' with x1 rest break for ~1 min for minor SOB. SPO2 remaining 98% throughout ambulation with HR up to 124 BPM. Pt returned to EoB with HR returning to 104 BPM with no SOB. Mod-I returning to supine. Pt will benefit from [facility] PT to improve strength/endurance with household balance/ambulation tasks to decrease risk of falls. Pt currently with functional limitations due to the deficits listed below (see PT Problem List). Pt will benefit from skilled PT to increase their independence and safety with mobility to allow discharge to the venue listed below.       Recommendations for follow up therapy are one component of a multi-disciplinary discharge planning process, led by the attending physician.  Recommendations may be updated based on patient status, additional functional criteria and insurance authorization.  Follow Up Recommendations Home health PT    Equipment  Recommendations  None recommended by PT    Recommendations for Other Services       Precautions / Restrictions Precautions Precautions: Fall Restrictions Weight Bearing Restrictions: No      Mobility  Bed Mobility Overal bed mobility: Modified Independent               Patient Response: Cooperative  Transfers Overall transfer level: Needs assistance Equipment used: None Transfers: Sit to/from Stand Sit to Stand: Supervision            Ambulation/Gait Ambulation/Gait assistance: Supervision Gait Distance (Feet): 150 Feet Assistive device: None Gait Pattern/deviations: Step-through pattern;Narrow base of support Gait velocity: Decreased but reports she is ambulating at baseline speed   General Gait Details: Steady throughout with no need of AD present.  Stairs            Wheelchair Mobility    Modified Rankin (Stroke Patients Only)       Balance Overall balance assessment: Needs assistance Sitting-balance support: No upper extremity supported;Feet supported Sitting balance-Leahy Scale: Normal       Standing balance-Leahy Scale: Good                               Pertinent Vitals/Pain Pain Assessment: No/denies pain    Home Living Family/patient expects to be discharged to:: Private residence Living Arrangements: Spouse/significant other Available Help at Discharge: Family Type of Home: House Home Access: Ramped entrance     Home Layout: One level;Other (Comment) (does have basement) Home Equipment: Walker - 2 wheels;Bedside commode;Cane - single point      Prior Function Level of Independence: Needs assistance  Gait / Transfers Assistance Needed: Pt utilizes SPC intermittently as needed.  ADL's / Homemaking Assistance Needed: Pt is independent for ADLs, IADLs. Reports husband now drives since previous admission        Hand Dominance        Extremity/Trunk Assessment   Upper Extremity Assessment Upper  Extremity Assessment: Overall WFL for tasks assessed    Lower Extremity Assessment Lower Extremity Assessment: Generalized weakness    Cervical / Trunk Assessment Cervical / Trunk Assessment: Normal  Communication   Communication: No difficulties  Cognition Arousal/Alertness: Awake/alert Behavior During Therapy: WFL for tasks assessed/performed Overall Cognitive Status: Within Functional Limits for tasks assessed                                        General Comments      Exercises     Assessment/Plan    PT Assessment Patient needs continued PT services  PT Problem List Decreased strength;Decreased activity tolerance       PT Treatment Interventions DME instruction;Therapeutic exercise;Gait training;Balance training;Neuromuscular re-education;Functional mobility training;Therapeutic activities;Patient/family education    PT Goals (Current goals can be found in the Care Plan section)  Acute Rehab PT Goals Patient Stated Goal: to go home PT Goal Formulation: With patient Time For Goal Achievement: 05/16/21 Potential to Achieve Goals: Good    Frequency Min 2X/week   Barriers to discharge        Co-evaluation               AM-PAC PT "6 Clicks" Mobility  Outcome Measure Help needed turning from your back to your side while in a flat bed without using bedrails?: None Help needed moving from lying on your back to sitting on the side of a flat bed without using bedrails?: None Help needed moving to and from a bed to a chair (including a wheelchair)?: A Little Help needed standing up from a chair using your arms (e.g., wheelchair or bedside chair)?: A Little Help needed to walk in hospital room?: None Help needed climbing 3-5 steps with a railing? : A Little 6 Click Score: 21    End of Session Equipment Utilized During Treatment: Gait belt;Oxygen Activity Tolerance: Patient tolerated treatment well Patient left: in bed;with nursing/sitter in  room;with call bell/phone within reach;with bed alarm set Nurse Communication: Mobility status PT Visit Diagnosis: Muscle weakness (generalized) (M62.81)    Time: 9935-7017 PT Time Calculation (min) (ACUTE ONLY): 33 min   Charges:   PT Evaluation $PT Eval Low Complexity: 1 Low PT Treatments $Therapeutic Activity: 8-22 mins        Levern Kalka M. Fairly IV, PT, DPT Physical Therapist- [facility]  05/02/2021, 3:26 PM

## 2021-05-02 NOTE — Progress Notes (Signed)
Pt called with difficulty breathing. 2 RN's and 2 CNA's responded. Pt sweating, breathing rapidly with heart rate 150. Rapid response called.

## 2021-05-02 NOTE — Progress Notes (Signed)
Cross cover  Patient having an episode of increased shortness of breath with tachypnea and tachycardia.  No chest pain.  Had a similar episode last night and had CT angiogram that was negative for acute PE.  At present time receiving nebulizer treatment.  Request made for anxiety medication.  Ativan prescribed.  We will continue to monitor.

## 2021-05-02 NOTE — Progress Notes (Signed)
Chaplain Maggie offered support during and after rapid response. Pt's family was present during a portion of the visit. Calm presence and prayer were shared. Continued support available per on call chaplain.

## 2021-05-02 NOTE — Progress Notes (Signed)
Inpatient Diabetes Program Recommendations  AACE/ADA: New Consensus Statement on Inpatient Glycemic Control (2015)  Target Ranges:  Prepandial:   less than 140 mg/dL      Peak postprandial:   less than 180 mg/dL (1-2 hours)      Critically ill patients:  140 - 180 mg/dL   Lab Results  Component Value Date   GLUCAP 291 (H) 05/02/2021   HGBA1C 8.2 (H) 04/02/2021    Review of Glycemic Control Results for MAKYNLEE, Angela Jensen (MRN [id number]) as of 05/02/2021 11:57  Ref. Range 05/01/2021 16:43 05/01/2021 21:31 05/02/2021 07:23 05/02/2021 11:15  Glucose-Capillary Latest Ref Range: 70 - 99 mg/dL 251 (H) 285 (H) 152 (H) 291 (H)   Diabetes history: Type 2 DM Outpatient Diabetes medications: Levemir 8-12 units QD, Metformin 500 mg QD Current orders for Inpatient glycemic control: Levemir 10 units BID, Novolog 0-9 units TID Solumedrol 40 mg BID  Inpatient Diabetes Program Recommendations:    If to remain on steroids, consider adding Novolog 3 units TID (Assuming patient consuming >50% of meals).   Thanks, Bronson Curb, MSN, RNC-OB Diabetes Coordinator [phone] (8a-5p)

## 2021-05-02 NOTE — Care Management Important Message (Signed)
Important Message  Patient Details  Name: Angela Jensen MRN: [id number] Date of Birth: 1955-12-25   Medicare Important Message Given:  Yes     Dannette Barbara 05/02/2021, 2:47 PM

## 2021-05-02 NOTE — Progress Notes (Signed)
Patient found during AM assessment on NRB at 15lpm.  RR 18-20 and non-labored.  Patient able to speak in full sentences.  SpO2 100%.  Weaned to 4lpm (2.5L baseline at home) with SpO2 99-100%.  Patient in no distress.  In discussion, she states that the breathing treatments she has been getting here at the hospital cause her increased respiratory distress after administration.  She takes albuterol at home with no issue.  Given albuterol this morning with no adverse effects, even an hour post administration.  Duoneb to be discontinued.

## 2021-05-03 DIAGNOSIS — J441 Chronic obstructive pulmonary disease with (acute) exacerbation: Secondary | ICD-10-CM | POA: Diagnosis not present

## 2021-05-03 DIAGNOSIS — I10 Essential (primary) hypertension: Secondary | ICD-10-CM | POA: Diagnosis not present

## 2021-05-03 DIAGNOSIS — E1165 Type 2 diabetes mellitus with hyperglycemia: Secondary | ICD-10-CM | POA: Diagnosis not present

## 2021-05-03 DIAGNOSIS — R778 Other specified abnormalities of plasma proteins: Secondary | ICD-10-CM | POA: Diagnosis not present

## 2021-05-03 LAB — BASIC METABOLIC PANEL
Anion gap: 3 — ABNORMAL LOW (ref 5–15)
BUN: 23 mg/dL (ref 8–23)
CO2: 32 mmol/L (ref 22–32)
Calcium: 8.4 mg/dL — ABNORMAL LOW (ref 8.9–10.3)
Chloride: 103 mmol/L (ref 98–111)
Creatinine, Ser: 0.5 mg/dL (ref 0.44–1.00)
GFR, Estimated: >60 mL/min (ref >60)
Glucose, Bld: 154 mg/dL — ABNORMAL HIGH (ref 70–99)
Potassium: 4.1 mmol/L (ref 3.5–5.1)
Sodium: 138 mmol/L (ref 135–145)

## 2021-05-03 LAB — CBC
HCT: 29.6 % — ABNORMAL LOW (ref 36.0–46.0)
Hemoglobin: 9.2 g/dL — ABNORMAL LOW (ref 12.0–15.0)
MCH: 28.8 pg (ref 26.0–34.0)
MCHC: 31.1 g/dL (ref 30.0–36.0)
MCV: 92.8 fL (ref 80.0–100.0)
Platelets: 263 10*3/uL (ref 150–400)
RBC: 3.19 MIL/uL — ABNORMAL LOW (ref 3.87–5.11)
RDW: 13.8 % (ref 11.5–15.5)
WBC: 10.4 10*3/uL (ref 4.0–10.5)
nRBC: 0 % (ref 0.0–0.2)

## 2021-05-03 LAB — GLUCOSE, CAPILLARY
Glucose-Capillary: 100 mg/dL — ABNORMAL HIGH (ref 70–99)
Glucose-Capillary: 235 mg/dL — ABNORMAL HIGH (ref 70–99)
Glucose-Capillary: 338 mg/dL — ABNORMAL HIGH (ref 70–99)
Glucose-Capillary: 237 mg/dL — ABNORMAL HIGH (ref 70–99)

## 2021-05-03 MED ORDER — ALPRAZOLAM 0.25 MG PO TABS
0.2500 mg | ORAL_TABLET | Freq: Every day | ORAL | Status: DC
  Administered 2021-05-03 – 2021-05-07 (×5): 0.25 mg via ORAL
  Filled 2021-05-03 (×5): qty 1

## 2021-05-03 MED ORDER — ALPRAZOLAM 0.5 MG PO TABS
0.5000 mg | ORAL_TABLET | Freq: Three times a day (TID) | ORAL | Status: DC | PRN
  Administered 2021-05-03 – 2021-05-06 (×6): 0.5 mg via ORAL
  Filled 2021-05-03 (×6): qty 1

## 2021-05-03 MED ORDER — BUDESONIDE 0.25 MG/2ML IN SUSP
0.2500 mg | Freq: Two times a day (BID) | RESPIRATORY_TRACT | Status: DC
  Administered 2021-05-03 (×2): 0.25 mg via RESPIRATORY_TRACT
  Filled 2021-05-03 (×3): qty 2

## 2021-05-03 MED ORDER — METHYLPREDNISOLONE SODIUM SUCC 1000 MG IJ SOLR
160.0000 mg | INTRAMUSCULAR | Status: DC
  Administered 2021-05-04 – 2021-05-05 (×2): 160 mg via INTRAVENOUS
  Filled 2021-05-03 (×3): qty 2.56

## 2021-05-03 NOTE — Progress Notes (Signed)
[facility] PROGRESS NOTE   Angela Jensen RJJ:[id number] DOB: 1956-02-10 DOA: 04/30/2021  PCP: Angela Guise, MD  Brief History/Interval Summary: 65 y.o. female with medical history significant for chronic hypoxic respiratory failure on 2 L continuous nasal cannula, COPD, type 2 diabetes mellitus, hypertension, chronic systolic heart failure, who is admitted to [facility] on 04/30/2021 with acute COPD exacerbation after presenting from home to [facility] ED complaining of shortness of breath.    Consultants: None  Procedures: None  Antibiotics: Anti-infectives (From admission, onward)    Start     Dose/Rate Route Frequency Ordered Stop   05/02/21 1315  doxycycline (VIBRAMYCIN) 100 mg in sodium chloride 0.9 % 250 mL IVPB        100 mg 125 mL/hr over 120 Minutes Intravenous Every 12 hours 05/02/21 1221     05/02/21 1000  ceFEPIme (MAXIPIME) 2 g in sodium chloride 0.9 % 100 mL IVPB  Status:  Discontinued        2 g 200 mL/hr over 30 Minutes Intravenous Every 12 hours 05/01/21 0117 05/02/21 1221   05/01/21 2300  vancomycin (VANCOREADY) IVPB 750 mg/150 mL  Status:  Discontinued        750 mg 150 mL/hr over 60 Minutes Intravenous Every 24 hours 05/01/21 0117 05/02/21 1221   04/30/21 2245  vancomycin (VANCOCIN) IVPB 1000 mg/200 mL premix        1,000 mg 200 mL/hr over 60 Minutes Intravenous  Once 04/30/21 2237 05/01/21 0133   04/30/21 2245  ceFEPIme (MAXIPIME) 2 g in sodium chloride 0.9 % 100 mL IVPB        2 g 200 mL/hr over 30 Minutes Intravenous  Once 04/30/21 2237 05/01/21 0034       Subjective/Interval History: Overnight events noted.  Patient mentions that she got very anxious and had a panic attack when she felt like she had to use the bathroom and could not get to it in time.  Her significant other is at the bedside today and he also mentions that patient was quite anxious.  Patient had a similar episode the previous night and CT angiogram did not show  any acute findings.  Otherwise patient feels okay this morning.  Agreeable to try some anxiolytics as needed.     Assessment/Plan:  COPD with acute exacerbation/chronic respiratory failure with hypoxia Patient with longstanding history of COPD on home oxygen at home at 2 to 3 L/min.  Noted to have wheezing when admitted.  Started on nebulizer treatments and steroids.  CT angiogram did not show any PE.  Have added budesonide nebulizer treatments.  Have increased the dose of Solu-Medrol which we will use for the next 24 to 48 hours.  Hopefully the overnight event was just triggered by anxiety which can be controlled with anxiolytic agents.  Continue to monitor for now. Oxygen can be weaned down to her usual nasal cannula at 3 L/min.  Acute anxiety Overnight event likely triggered by acute anxiety.  This is situational.  She had a similar episode the previous night.  CT angiogram did not show any PE or other acute findings.  Will add anxiolytic agent while she is in the hospital.  Does not report any anxiety while at home.  Will not use any long-acting agents currently  Healthcare associated pneumonia Chest x-ray raised concern for infectious process in the right lung.  She was recently hospitalized.   Patient placed on broad-spectrum antibiotics with vancomycin and cefepime.  Unfortunately no cultures  were sent from the hospital.  Procalcitonin was less than 0.1.  CT angiogram does not show any clear-cut infiltrates.  Chronic scarring is noted.   Influenza and COVID-19 PCR's were negative. She was changed over to doxycycline.  Mildly elevated troponin/chronic systolic CHF Very flat trend.  Likely mild demand ischemia.  Denies any anginal symptoms currently.  Do not anticipate any further testing at this time.  Recently done echocardiogram in August showed slightly diminished systolic function with a EF of 45 to 50% with global hypokinesis.  Do not see any cardiology notes in our system.   She will  benefit from being seen by cardiologist in the outpatient setting.  No clear indication for inpatient consultation at this time.    Diabetes mellitus type 2, uncontrolled with hyperglycemia Elevated glucose levels likely due to steroids.  Levemir was resumed.  Continue current dose for now.  HbA1c 8.2 when checked in August.    Essential hypertension Blood pressure is reasonably well controlled.  Lisinopril and HCTZ on hold.    Normocytic anemia Hemoglobin stable for the most part.  Anemia panel reviewed.  B12 level is 748.  Folic acid 59.1, ferritin 21, TIBC 395, percent saturation 10.  Could suggest iron deficiency.  She was on ferrous sulfate.  Dose was increased to twice a day.   May need further work-up in the outpatient setting which will be deferred to her PCP.Angela Jensen  No overt blood loss noted.  Mild hyponatremia Continue to monitor.  History of peripheral artery disease Stable.  Continue Plavix.  DVT Prophylaxis: Lovenox Code Status: Full code Family Communication: No family at bedside.  Discussed with patient and her significant other who was at bedside, with patient's permission. Disposition Plan: Hopefully return home when improved.  Mobilize.  Status is: Inpatient  Remains inpatient appropriate because:IV treatments appropriate due to intensity of illness or inability to take PO and Inpatient level of care appropriate due to severity of illness  Dispo: The patient is from: Home              Anticipated d/c is to: Home              Patient currently is not medically stable to d/c.   Difficult to place patient No     Medications: Scheduled:  albuterol  2.5 mg Nebulization TID   budesonide (PULMICORT) nebulizer solution  0.25 mg Nebulization BID   clopidogrel  75 mg Oral Daily   enoxaparin (LOVENOX) injection  40 mg Subcutaneous Q24H   ferrous sulfate  324 mg Oral BID WC   gabapentin  100 mg Oral BID   insulin aspart  0-9 Units Subcutaneous TID WC   insulin detemir  10  Units Subcutaneous BID   Continuous:  doxycycline (VIBRAMYCIN) IV 100 mg (05/03/21 0403)   [START ON 05/04/2021] methylPREDNISolone (SOLU-MEDROL) injection     MBW:[id number] **OR** acetaminophen, albuterol, LORazepam   Objective:  Vital Signs  Vitals:   05/02/21 2309 05/03/21 0343 05/03/21 0500 05/03/21 0729  BP: 128/81 (!) 137/93  134/87  Pulse: (!) 119 98  89  Resp: 18 20    Temp: 97.7 F (36.5 C) 98 F (36.7 C)  98.1 F (36.7 C)  TempSrc:    Oral  SpO2: 99% 100%  99%  Weight:   52.6 kg   Height:        Intake/Output Summary (Last 24 hours) at 05/03/2021 0940 Last data filed at 05/02/2021 1457 Gross per 24 hour  Intake 720 ml  Output 700 ml  Net 20 ml    Filed Weights   05/01/21 1505 05/02/21 0500 05/03/21 0500  Weight: 52.8 kg 52.6 kg 52.6 kg    General appearance: Awake alert.  In no distress.  Slightly anxious Resp: Mildly tachypneic.  Scattered wheezing bilaterally.  No crackles.  No rhonchi.  No use of accessory muscles. Cardio: S1-S2 is normal regular.  No S3-S4.  No rubs murmurs or bruit GI: Abdomen is soft.  Nontender nondistended.  Bowel sounds are present normal.  No masses organomegaly Extremities: No edema.  Full range of motion of lower extremities. Neurologic: Alert and oriented x3.  No focal neurological deficits.      Lab Results:  Data Reviewed: I have personally reviewed following labs and imaging studies  CBC: Recent Labs  Lab 04/30/21 2147 05/01/21 0619 05/02/21 0758 05/03/21 0417  WBC 6.2 8.5 9.7 10.4  HGB 10.1* 10.4* 9.9* 9.2*  HCT 31.8* 32.3* 31.2* 29.6*  MCV 91.9 92.0 93.1 92.8  PLT 273 280 272 263     Basic Metabolic Panel: Recent Labs  Lab 04/30/21 2147 04/30/21 2341 05/01/21 0619 05/02/21 0758 05/03/21 0417  NA 134*  --  133* 138 138  K 3.6  --  4.6 4.0 4.1  CL 100  --  101 103 103  CO2 27  --  25 31 32  GLUCOSE 174*  --  347* 151* 154*  BUN 19  --  16 19 23   CREATININE 0.60  --  0.58 0.45 0.50  CALCIUM  8.6*  --  8.8* 8.6* 8.4*  MG  --  2.0 2.0  --   --   PHOS  --   --  3.3  --   --      GFR: Estimated Creatinine Clearance: 52 mL/min (by C-G formula based on SCr of 0.5 mg/dL).  Liver Function Tests: Recent Labs  Lab 05/01/21 0619  AST 30  ALT 23  ALKPHOS 63  BILITOT 0.5  PROT 6.9  ALBUMIN 3.7       CBG: Recent Labs  Lab 05/02/21 0723 05/02/21 1115 05/02/21 1639 05/02/21 2244 05/03/21 0802  GLUCAP 152* 291* 294* 220* 100*       Recent Results (from the past 240 hour(s))  Resp Panel by RT-PCR (Flu A&B, Covid) Nasopharyngeal Swab     Status: None   Collection Time: 04/30/21 10:24 PM   Specimen: Nasopharyngeal Swab; Nasopharyngeal(NP) swabs in vial transport medium  Result Value Ref Range Status   SARS Coronavirus 2 by RT PCR NEGATIVE NEGATIVE Final    Comment: (NOTE) SARS-CoV-2 target nucleic acids are NOT DETECTED.  The SARS-CoV-2 RNA is generally detectable in upper respiratory specimens during the acute phase of infection. The lowest concentration of SARS-CoV-2 viral copies this assay can detect is 138 copies/mL. A negative result does not preclude SARS-Cov-2 infection and should not be used as the sole basis for treatment or other patient management decisions. A negative result may occur with  improper specimen collection/handling, submission of specimen other than nasopharyngeal swab, presence of viral mutation(s) within the areas targeted by this assay, and inadequate number of viral copies(<138 copies/mL). A negative result must be combined with clinical observations, patient history, and epidemiological information. The expected result is Negative.  Fact Sheet for Patients:  EntrepreneurPulse.com.au  Fact Sheet for Healthcare Providers:  IncredibleEmployment.be  This test is no t yet approved or cleared by the [place] FDA and  has been authorized for detection and/or diagnosis of SARS-CoV-2 by  FDA under  an Emergency Use Authorization (EUA). This EUA will remain  in effect (meaning this test can be used) for the duration of the COVID-19 declaration under Section 564(b)(1) of the Act, 21 U.S.C.section 360bbb-3(b)(1), unless the authorization is terminated  or revoked sooner.       Influenza A by PCR NEGATIVE NEGATIVE Final   Influenza B by PCR NEGATIVE NEGATIVE Final    Comment: (NOTE) The Xpert Xpress SARS-CoV-2/FLU/RSV plus assay is intended as an aid in the diagnosis of influenza from Nasopharyngeal swab specimens and should not be used as a sole basis for treatment. Nasal washings and aspirates are unacceptable for Xpert Xpress SARS-CoV-2/FLU/RSV testing.  Fact Sheet for Patients: EntrepreneurPulse.com.au  Fact Sheet for Healthcare Providers: IncredibleEmployment.be  This test is not yet approved or cleared by the [place] FDA and has been authorized for detection and/or diagnosis of SARS-CoV-2 by FDA under an Emergency Use Authorization (EUA). This EUA will remain in effect (meaning this test can be used) for the duration of the COVID-19 declaration under Section 564(b)(1) of the Act, 21 U.S.C. section 360bbb-3(b)(1), unless the authorization is terminated or revoked.  Performed at [facility], [address] [place], [place] [address]        Radiology Studies: CT Angio Chest Pulmonary Embolism (PE) W or WO Contrast  Result Date: 05/02/2021 CLINICAL DATA:  Shortness of breath EXAM: CT ANGIOGRAPHY CHEST WITH CONTRAST TECHNIQUE: Multidetector CT imaging of the chest was performed using the standard protocol during bolus administration of intravenous contrast. Multiplanar CT image reconstructions and MIPs were obtained to evaluate the vascular anatomy. CONTRAST:  38mL OMNIPAQUE IOHEXOL 350 MG/ML SOLN COMPARISON:  CT chest dated 03/31/2021 FINDINGS: Cardiovascular: Satisfactory opacification the bilateral pulmonary arteries  to the lobar level. However, evaluation is constrained by respiratory motion, particularly in the lower lobes. Within that constraint, there is no convincing evidence of pulmonary embolism. Suspected artifact within a segmental right lower lobe pulmonary artery (series 8/image 167) in an area of motion degradation. Although not tailored for evaluation of the thoracic aorta, there is no evidence of thoracic aortic aneurysm or dissection. Atherosclerotic calcifications of the aortic arch. Heart is normal in size.  No pericardial effusion. Mediastinum/Nodes: No suspicious mediastinal lymphadenopathy. Visualized thyroid is unremarkable. Lungs/Pleura: Scarring in the right lung apex (series 9/image 16). Platelike scarring in the inferior right middle lobe (series 9/image 46). Scarring/radiation changes in the right lower lobe (series 9/image 60). These findings are unchanged from the prior. Moderate centrilobular and paraseptal emphysematous changes, upper lung predominant. No suspicious pulmonary nodules. Mild bibasilar atelectasis. No pleural effusion or pneumothorax. Upper Abdomen: Visualized upper abdomen is grossly unremarkable. Musculoskeletal: Post treatment changes involving the right lateral 7th rib (series 7/image 48), unchanged. Mild degenerative changes of the lower thoracic spine. Review of the MIP images confirms the above findings. IMPRESSION: No evidence of central pulmonary embolism, noting motion degradation. Scarring/radiation changes in the right hemithorax, unchanged. Aortic Atherosclerosis (ICD10-I70.0) and Emphysema (ICD10-J43.9). Electronically Signed   By: Julian Hy M.D.   On: 05/02/2021 00:13   DG Chest Port 1 View  Result Date: 05/01/2021 CLINICAL DATA:  Hypoxia EXAM: PORTABLE CHEST 1 VIEW COMPARISON:  04/30/2021 FINDINGS: Right lower lung scarring. Subpleural reticulation/fibrosis at the lung bases. No pleural effusion or pneumothorax. The heart is normal in size. IMPRESSION:  Bilateral lower lung scarring. No evidence of acute cardiopulmonary disease. Electronically Signed   By: Julian Hy M.D.   On: 05/01/2021 21:15  LOS: 3 days   Kaysie Michelini [employer] on [email or web address]  05/03/2021, 9:40 AM

## 2021-05-04 DIAGNOSIS — F419 Anxiety disorder, unspecified: Secondary | ICD-10-CM

## 2021-05-04 DIAGNOSIS — J441 Chronic obstructive pulmonary disease with (acute) exacerbation: Secondary | ICD-10-CM | POA: Diagnosis not present

## 2021-05-04 DIAGNOSIS — I10 Essential (primary) hypertension: Secondary | ICD-10-CM | POA: Diagnosis not present

## 2021-05-04 DIAGNOSIS — E1165 Type 2 diabetes mellitus with hyperglycemia: Secondary | ICD-10-CM | POA: Diagnosis not present

## 2021-05-04 LAB — GLUCOSE, CAPILLARY
Glucose-Capillary: 98 mg/dL (ref 70–99)
Glucose-Capillary: 253 mg/dL — ABNORMAL HIGH (ref 70–99)
Glucose-Capillary: 339 mg/dL — ABNORMAL HIGH (ref 70–99)
Glucose-Capillary: 293 mg/dL — ABNORMAL HIGH (ref 70–99)

## 2021-05-04 MED ORDER — UMECLIDINIUM BROMIDE 62.5 MCG/INH IN AEPB
1.0000 | INHALATION_SPRAY | Freq: Every day | RESPIRATORY_TRACT | Status: DC
  Administered 2021-05-04 – 2021-05-08 (×5): 1 via RESPIRATORY_TRACT
  Filled 2021-05-04: qty 7

## 2021-05-04 MED ORDER — CARVEDILOL 3.125 MG PO TABS
3.1250 mg | ORAL_TABLET | Freq: Two times a day (BID) | ORAL | Status: DC
  Administered 2021-05-04 – 2021-05-08 (×8): 3.125 mg via ORAL
  Filled 2021-05-04 (×8): qty 1

## 2021-05-04 MED ORDER — FLUTICASONE FUROATE-VILANTEROL 100-25 MCG/INH IN AEPB
1.0000 | INHALATION_SPRAY | Freq: Every day | RESPIRATORY_TRACT | Status: DC
  Administered 2021-05-04 – 2021-05-08 (×5): 1 via RESPIRATORY_TRACT
  Filled 2021-05-04: qty 28

## 2021-05-04 MED ORDER — LISINOPRIL 10 MG PO TABS
10.0000 mg | ORAL_TABLET | Freq: Every day | ORAL | Status: DC
  Administered 2021-05-04 – 2021-05-08 (×5): 10 mg via ORAL
  Filled 2021-05-04 (×5): qty 1

## 2021-05-04 MED ORDER — FLUTICASONE-UMECLIDIN-VILANT 100-62.5-25 MCG/INH IN AEPB: 1.0000 | INHALATION_SPRAY | Freq: Every day | RESPIRATORY_TRACT | Status: DC

## 2021-05-04 NOTE — Progress Notes (Signed)
@  approx. 0045, Dr. Damita Dunnings, on-call for attending, notified about pt's BP for midnight vitals (164/108 confirmed x3) per order. MD confirmed receipt of notification and no new orders received. Will continue to monitor.

## 2021-05-04 NOTE — Progress Notes (Signed)
[facility] PROGRESS NOTE   Angela Jensen BZJ:[id number] DOB: 05/27/1956 DOA: 04/30/2021  PCP: [name], MD  Brief History/Interval Summary: 65 y.o. female with medical history significant for chronic hypoxic respiratory failure on 2 L continuous nasal cannula, COPD, type 2 diabetes mellitus, hypertension, chronic systolic heart failure, who is admitted to [facility] on 04/30/2021 with acute COPD exacerbation after presenting from home to [facility] ED complaining of shortness of breath.    Consultants: None  Procedures: None  Antibiotics: Anti-infectives (From admission, onward)    Start     Dose/Rate Route Frequency Ordered Stop   05/02/21 1315  doxycycline (VIBRAMYCIN) 100 mg in sodium chloride 0.9 % 250 mL IVPB        100 mg 125 mL/hr over 120 Minutes Intravenous Every 12 hours 05/02/21 1221     05/02/21 1000  ceFEPIme (MAXIPIME) 2 g in sodium chloride 0.9 % 100 mL IVPB  Status:  Discontinued        2 g 200 mL/hr over 30 Minutes Intravenous Every 12 hours 05/01/21 0117 05/02/21 1221   05/01/21 2300  vancomycin (VANCOREADY) IVPB 750 mg/150 mL  Status:  Discontinued        750 mg 150 mL/hr over 60 Minutes Intravenous Every 24 hours 05/01/21 0117 05/02/21 1221   04/30/21 2245  vancomycin (VANCOCIN) IVPB 1000 mg/200 mL premix        1,000 mg 200 mL/hr over 60 Minutes Intravenous  Once 04/30/21 2237 05/01/21 0133   04/30/21 2245  ceFEPIme (MAXIPIME) 2 g in sodium chloride 0.9 % 100 mL IVPB        2 g 200 mL/hr over 30 Minutes Intravenous  Once 04/30/21 2237 05/01/21 0034       Subjective/Interval History: Patient mentions that she again got very anxious overnight although not as bad as the previous night.  Shortness of breath is about the same with no significant improvement.  Denies any chest pain.    Assessment/Plan:  COPD with acute exacerbation/chronic respiratory failure with hypoxia Patient with longstanding history of COPD on home oxygen at  home at 2 to 3 L/min.  Noted to have wheezing when admitted.  Started on nebulizer treatments and steroids.  CT angiogram did not show any PE.   Patient appears to develop anxiety mainly in the nighttime which triggers her shortness of breath.  She also feels that the nebulizer treatment she is getting is also contributing.  She requests to go back on inhalers.  I will stop the budesonide and switch her back to her home inhaler.  Continue with albuterol nebulizers as needed. Continue oxygen at 3 L/min.  Continue high-dose of steroids for now.    Acute anxiety Patient was started on alprazolam as needed and scheduled dose at bedtime.  Seems to be slightly better.  Will not make any changes to her doses today.  See how she does over the next 24 hours.  Does not report any anxiety while at home.  Will not use any long-acting agents as this appears to be a situational episode.  Healthcare associated pneumonia Chest x-ray raised concern for infectious process in the right lung.  She was recently hospitalized.   Patient placed on broad-spectrum antibiotics with vancomycin and cefepime.  Unfortunately no cultures were sent from the hospital.  Procalcitonin was less than 0.1.  CT angiogram does not show any clear-cut infiltrates.  Chronic scarring is noted.   Influenza and COVID-19 PCR's were negative. She was changed  over to doxycycline.  Mildly elevated troponin/chronic systolic CHF Very flat trend.  Likely mild demand ischemia.  Denies any anginal symptoms currently.  Do not anticipate any further testing at this time.  Recently done echocardiogram in August showed slightly diminished systolic function with a EF of 45 to 50% with global hypokinesis.  Do not see any cardiology notes in our system.   She will benefit from being seen by cardiologist in the outpatient setting.  No clear indication for inpatient consultation at this time.  Seems to be stable from a cardiac standpoint.  No evidence for volume  overload.  She is on ACE inhibitor at home which was held at admission.  Since blood pressures are now elevated we will resume.  Could also add low-dose carvedilol.  Diabetes mellitus type 2, uncontrolled with hyperglycemia Elevated glucose levels likely due to steroids.  Levemir was resumed.  HbA1c 8.2 when checked in August.  Elevated glucose levels likely due to steroids.  Continue to monitor.  Glucose noted to be 98 this morning.  Will not change her Levemir dose at this time.  Essential hypertension Blood pressure is reasonably well controlled.  Resume lisinopril.  HCTZ remains on hold.  Normocytic anemia Hemoglobin stable for the most part.  Anemia panel reviewed.  B12 level is 748.  Folic acid 81.1, ferritin 21, TIBC 395, percent saturation 10.  Could suggest iron deficiency.  She was on ferrous sulfate.  Dose was increased to twice a day.   May need further work-up in the outpatient setting which will be deferred to her PCP.Marland Kitchen  No overt blood loss noted.  Mild hyponatremia Continue to monitor.  History of peripheral artery disease Stable.  Continue Plavix.  DVT Prophylaxis: Lovenox Code Status: Full code Family Communication: Discussed with patient. Disposition Plan: Hopefully return home when improved.  Mobilize.  Status is: Inpatient  Remains inpatient appropriate because:IV treatments appropriate due to intensity of illness or inability to take PO and Inpatient level of care appropriate due to severity of illness  Dispo: The patient is from: Home              Anticipated d/c is to: Home              Patient currently is not medically stable to d/c.   Difficult to place patient No     Medications: Scheduled:  albuterol  2.5 mg Nebulization TID   ALPRAZolam  0.25 mg Oral QHS   clopidogrel  75 mg Oral Daily   enoxaparin (LOVENOX) injection  40 mg Subcutaneous Q24H   ferrous sulfate  324 mg Oral BID WC   fluticasone furoate-vilanterol  1 puff Inhalation Daily    gabapentin  100 mg Oral BID   insulin aspart  0-9 Units Subcutaneous TID WC   insulin detemir  10 Units Subcutaneous BID   umeclidinium bromide  1 puff Inhalation Daily   Continuous:  doxycycline (VIBRAMYCIN) IV Stopped (05/04/21 0248)   methylPREDNISolone (SOLU-MEDROL) injection Stopped (05/04/21 0554)   BJY:[id number] **OR** acetaminophen, albuterol, ALPRAZolam   Objective:  Vital Signs  Vitals:   05/03/21 2100 05/04/21 0034 05/04/21 0515 05/04/21 0746  BP:  (!) 164/108 (!) 133/91 (!) 144/100  Pulse:  (!) 104 97 95  Resp:  20 (P) 18 17  Temp:  97.6 F (36.4 C) 98.2 F (36.8 C) 98.8 F (37.1 C)  TempSrc:  Oral Oral Oral  SpO2: 95% 94% 100% 96%  Weight:   57.4 kg   Height:  Intake/Output Summary (Last 24 hours) at 05/04/2021 1056 Last data filed at 05/04/2021 1007 Gross per 24 hour  Intake 1402.62 ml  Output 1750 ml  Net -347.38 ml    Filed Weights   05/02/21 0500 05/03/21 0500 05/04/21 0515  Weight: 52.6 kg 52.6 kg 57.4 kg    General appearance: Awake alert.  In no distress Resp: Mildly tachypneic without any use of accessory muscles.  Scattered wheezing bilaterally.  No crackles.  No rhonchi. Cardio: S1-S2 is normal regular.  No S3-S4.  No rubs murmurs or bruit GI: Abdomen is soft.  Nontender nondistended.  Bowel sounds are present normal.  No masses organomegaly Extremities: No edema.   Neurologic: Alert and oriented x3.  No focal neurological deficits.     Lab Results:  Data Reviewed: I have personally reviewed following labs and imaging studies  CBC: Recent Labs  Lab 04/30/21 2147 05/01/21 0619 05/02/21 0758 05/03/21 0417  WBC 6.2 8.5 9.7 10.4  HGB 10.1* 10.4* 9.9* 9.2*  HCT 31.8* 32.3* 31.2* 29.6*  MCV 91.9 92.0 93.1 92.8  PLT 273 280 272 263     Basic Metabolic Panel: Recent Labs  Lab 04/30/21 2147 04/30/21 2341 05/01/21 0619 05/02/21 0758 05/03/21 0417  NA 134*  --  133* 138 138  K 3.6  --  4.6 4.0 4.1  CL 100  --  101  103 103  CO2 27  --  25 31 32  GLUCOSE 174*  --  347* 151* 154*  BUN 19  --  16 19 23   CREATININE 0.60  --  0.58 0.45 0.50  CALCIUM 8.6*  --  8.8* 8.6* 8.4*  MG  --  2.0 2.0  --   --   PHOS  --   --  3.3  --   --      GFR: Estimated Creatinine Clearance: 54.1 mL/min (by C-G formula based on SCr of 0.5 mg/dL).  Liver Function Tests: Recent Labs  Lab 05/01/21 0619  AST 30  ALT 23  ALKPHOS 63  BILITOT 0.5  PROT 6.9  ALBUMIN 3.7       CBG: Recent Labs  Lab 05/03/21 0802 05/03/21 1116 05/03/21 1626 05/03/21 2100 05/04/21 0747  GLUCAP 100* 235* 338* 237* 98       Recent Results (from the past 240 hour(s))  Resp Panel by RT-PCR (Flu A&B, Covid) Nasopharyngeal Swab     Status: None   Collection Time: 04/30/21 10:24 PM   Specimen: Nasopharyngeal Swab; Nasopharyngeal(NP) swabs in vial transport medium  Result Value Ref Range Status   SARS Coronavirus 2 by RT PCR NEGATIVE NEGATIVE Final    Comment: (NOTE) SARS-CoV-2 target nucleic acids are NOT DETECTED.  The SARS-CoV-2 RNA is generally detectable in upper respiratory specimens during the acute phase of infection. The lowest concentration of SARS-CoV-2 viral copies this assay can detect is 138 copies/mL. A negative result does not preclude SARS-Cov-2 infection and should not be used as the sole basis for treatment or other patient management decisions. A negative result may occur with  improper specimen collection/handling, submission of specimen other than nasopharyngeal swab, presence of viral mutation(s) within the areas targeted by this assay, and inadequate number of viral copies(<138 copies/mL). A negative result must be combined with clinical observations, patient history, and epidemiological information. The expected result is Negative.  Fact Sheet for Patients:  EntrepreneurPulse.com.au  Fact Sheet for Healthcare Providers:  IncredibleEmployment.be  This test is  no t yet approved or cleared by the [place]  States FDA and  has been authorized for detection and/or diagnosis of SARS-CoV-2 by FDA under an Emergency Use Authorization (EUA). This EUA will remain  in effect (meaning this test can be used) for the duration of the COVID-19 declaration under Section 564(b)(1) of the Act, 21 U.S.C.section 360bbb-3(b)(1), unless the authorization is terminated  or revoked sooner.       Influenza A by PCR NEGATIVE NEGATIVE Final   Influenza B by PCR NEGATIVE NEGATIVE Final    Comment: (NOTE) The Xpert Xpress SARS-CoV-2/FLU/RSV plus assay is intended as an aid in the diagnosis of influenza from Nasopharyngeal swab specimens and should not be used as a sole basis for treatment. Nasal washings and aspirates are unacceptable for Xpert Xpress SARS-CoV-2/FLU/RSV testing.  Fact Sheet for Patients: EntrepreneurPulse.com.au  Fact Sheet for Healthcare Providers: IncredibleEmployment.be  This test is not yet approved or cleared by the [place] FDA and has been authorized for detection and/or diagnosis of SARS-CoV-2 by FDA under an Emergency Use Authorization (EUA). This EUA will remain in effect (meaning this test can be used) for the duration of the COVID-19 declaration under Section 564(b)(1) of the Act, 21 U.S.C. section 360bbb-3(b)(1), unless the authorization is terminated or revoked.  Performed at [facility], [address], [place], [place] [address]        Radiology Studies: No results found.     LOS: 4 days   Finlee Milo [employer] on [email or web address]  05/04/2021, 10:56 AM

## 2021-05-05 DIAGNOSIS — F419 Anxiety disorder, unspecified: Secondary | ICD-10-CM | POA: Diagnosis not present

## 2021-05-05 DIAGNOSIS — E1165 Type 2 diabetes mellitus with hyperglycemia: Secondary | ICD-10-CM | POA: Diagnosis not present

## 2021-05-05 DIAGNOSIS — J441 Chronic obstructive pulmonary disease with (acute) exacerbation: Secondary | ICD-10-CM | POA: Diagnosis not present

## 2021-05-05 DIAGNOSIS — I10 Essential (primary) hypertension: Secondary | ICD-10-CM | POA: Diagnosis not present

## 2021-05-05 LAB — GLUCOSE, CAPILLARY
Glucose-Capillary: 135 mg/dL — ABNORMAL HIGH (ref 70–99)
Glucose-Capillary: 290 mg/dL — ABNORMAL HIGH (ref 70–99)
Glucose-Capillary: 412 mg/dL — ABNORMAL HIGH (ref 70–99)
Glucose-Capillary: 248 mg/dL — ABNORMAL HIGH (ref 70–99)

## 2021-05-05 MED ORDER — INSULIN ASPART 100 UNIT/ML IJ SOLN
15.0000 [IU] | Freq: Once | INTRAMUSCULAR | Status: AC
  Administered 2021-05-05: 15 [IU] via SUBCUTANEOUS
  Filled 2021-05-05: qty 1

## 2021-05-05 MED ORDER — DOXYCYCLINE HYCLATE 100 MG PO TABS
100.0000 mg | ORAL_TABLET | Freq: Two times a day (BID) | ORAL | Status: AC
  Administered 2021-05-05 – 2021-05-06 (×4): 100 mg via ORAL
  Filled 2021-05-05 (×4): qty 1

## 2021-05-05 MED ORDER — METHYLPREDNISOLONE SODIUM SUCC 40 MG IJ SOLR
40.0000 mg | Freq: Two times a day (BID) | INTRAMUSCULAR | Status: DC
  Administered 2021-05-05 – 2021-05-07 (×4): 40 mg via INTRAVENOUS
  Filled 2021-05-05 (×4): qty 1

## 2021-05-05 MED ORDER — INSULIN ASPART 100 UNIT/ML IJ SOLN
0.0000 [IU] | Freq: Every day | INTRAMUSCULAR | Status: DC
  Administered 2021-05-05: 2 [IU] via SUBCUTANEOUS
  Filled 2021-05-05 (×2): qty 1

## 2021-05-05 MED ORDER — INSULIN ASPART 100 UNIT/ML IJ SOLN
0.0000 [IU] | Freq: Three times a day (TID) | INTRAMUSCULAR | Status: DC
  Administered 2021-05-06: 4 [IU] via SUBCUTANEOUS
  Administered 2021-05-06: 11 [IU] via SUBCUTANEOUS
  Administered 2021-05-06: 3 [IU] via SUBCUTANEOUS
  Administered 2021-05-07: 7 [IU] via SUBCUTANEOUS
  Administered 2021-05-07: 4 [IU] via SUBCUTANEOUS
  Administered 2021-05-08: 20 [IU] via SUBCUTANEOUS
  Filled 2021-05-05 (×3): qty 1

## 2021-05-05 NOTE — Care Management Important Message (Signed)
Important Message  Patient Details  Name: Angela Jensen MRN: [id number] Date of Birth: 24-Nov-1955   Medicare Important Message Given:  Yes     Dannette Barbara 05/05/2021, 1:55 PM

## 2021-05-05 NOTE — Progress Notes (Signed)
Called by RN to place patient on a HFNC. Pt and family member at bedside states that she is breathing through her mouth and can't tolerate the flow up her nose. Removed pt from NRB and placed pt on 45% VM. Pt is tolerating well and SPO2 100%. RN aware.

## 2021-05-05 NOTE — TOC Initial Note (Signed)
Transition of Care [facility]) - Initial/Assessment Note    Patient Details  Name: Angela Jensen MRN: [id number] Date of Birth: 1955/10/17  [facility] of [facility]) CM/SW Contact:    Kerin Salen, RN Phone Number: 05/05/2021, 12:33 PM  Clinical Narrative:   Spoke with patient who was alert and oriented x4, in NAD. Lives with her Boyfriend of 30years, still driving and independent with ADL's, cooking, shopping and attending medical appointments. Patient also uses [facility] [place]. PCP Dr. Lajuana [facility]. On continuous Oxygen 2.5L, serviced by [employer]. PT evaluation for HHPT, however patient is not homebound will require referral for external PT, will consult with Attending.         Expected Discharge Plan: [place] Barriers to Discharge: Continued Medical Work up   Patient Goals and CMS Choice Patient states their goals for this hospitalization and ongoing recovery are:: Return home.   Choice offered to / list presented to : Patient  Expected Discharge Plan and Services Expected Discharge Plan: [place] Choice: [place] arrangements for the past 2 months: Single Family Home                   DME Agency: NA                  Prior Living Arrangements/Services Living arrangements for the past 2 months: Single Family Home Lives with:: Significant Other Patient language and need for interpreter reviewed:: Yes Do you feel safe going back to the place where you live?: Yes      Need for Family Participation in Patient Care: No (Comment) Care giver support system in place?: Yes (comment)   Criminal Activity/Legal Involvement Pertinent to Current Situation/Hospitalization: No - Comment as needed  Activities of Daily Living Home Assistive Devices/Equipment: Cane (specify quad or straight), Oxygen, Walker (specify type) ADL Screening (condition at time of admission) Patient's cognitive  ability adequate to safely complete daily activities?: Yes Is the patient deaf or have difficulty hearing?: No Does the patient have difficulty seeing, even when wearing glasses/contacts?: No Does the patient have difficulty concentrating, remembering, or making decisions?: No Patient able to express need for assistance with ADLs?: Yes Does the patient have difficulty dressing or bathing?: No Independently performs ADLs?: Yes (appropriate for developmental age) Communication: Independent Dressing (OT): Independent Grooming: Independent Feeding: Independent Bathing: Independent Toileting: Independent with device (comment) In/Out Bed: Needs assistance Is this a change from baseline?: Change from baseline, expected to last <3 days Walks in Home: Independent with device (comment) Does the patient have difficulty walking or climbing stairs?: Yes Weakness of Legs: Both Weakness of Arms/Hands: None  Permission Sought/Granted                  Emotional Assessment Appearance:: Appears stated age Attitude/Demeanor/Rapport: Engaged Affect (typically observed): Accepting Orientation: : Oriented to Self, Oriented to Place, Oriented to  Time, Oriented to Situation Alcohol / Substance Use: Not Applicable Psych Involvement: No (comment)  Admission diagnosis:  Shortness of breath [R06.02] Acute exacerbation of chronic obstructive pulmonary disease (COPD) (HCC) [J44.1] COPD exacerbation (HCC) [J44.1] Patient Active Problem List   Diagnosis Date Noted   SOB (shortness of breath) 05/01/2021   HCAP (healthcare-associated pneumonia) 94/17/4081   Chronic systolic CHF (congestive heart failure) ([place]) 05/01/2021   Acute exacerbation of chronic obstructive pulmonary disease (COPD) ([place]) 04/30/2021   Elevated troponin 04/01/2021   Acute on chronic  respiratory failure with hypoxia ([place]) 03/31/2021   Pneumothorax after biopsy 05/03/2020   Pneumothorax on right 05/01/2020   Chest pain on breathing  04/21/2020   Pleuritic chest pain 04/11/2020   Right lower lobe pulmonary nodule 04/02/2020   Iron deficiency anemia 04/02/2020   Pica in adults 04/02/2020   Goals of care, counseling/discussion 03/14/2020   Acute non-recurrent pansinusitis 01/01/2020   Pain in left wrist 09/24/2019   Encounter for general adult medical examination with abnormal findings 06/28/2019   Elevated total protein 01/22/2019   Uncontrolled type 2 diabetes mellitus with hyperglycemia ([place]) 05/16/2018   Chronic obstructive pulmonary disease ([place]) 05/16/2018   Vitamin D deficiency 05/16/2018   Flu vaccine need 05/16/2018   Dysuria 05/16/2018   Cancer of lower lobe of right lung ([place]) 01/21/2018   Screening for breast cancer 10/12/2017   Personal history of tobacco use, presenting hazards to health 02/03/2017   PAD (peripheral artery disease) ([place]) 06/22/2016   Essential hypertension 05/26/2016   Diabetes ([place]) 05/26/2016   Hyperlipidemia 05/26/2016   Atherosclerosis of native arteries of extremity with intermittent claudication ([place]) 05/26/2016   Uterine leiomyoma 04/30/2016   Elevated CEA 01/31/2016   Special screening for malignant neoplasms, colon    Benign neoplasm of sigmoid colon    Primary lung cancer ([place])    Malignant neoplasm of upper lobe of right lung [facility])    PCP:  [name], MD Pharmacy:   [facility] [address]), [place]) [place] [address] Phone: [phone] Fax: [phone]     Social Determinants of Health (SDOH) Interventions    Readmission Risk Interventions Readmission Risk Prevention Plan 05/05/2021  Transportation Screening Complete  PCP or Specialist Appt within 5-7 Days Complete  Home Care Screening Complete  Medication Review (RN CM) Complete  Some recent data might be hidden

## 2021-05-05 NOTE — Progress Notes (Signed)
Inpatient Diabetes Program Recommendations  AACE/ADA: New Consensus Statement on Inpatient Glycemic Control (2015)  Target Ranges:  Prepandial:   less than 140 mg/dL      Peak postprandial:   less than 180 mg/dL (1-2 hours)      Critically ill patients:  140 - 180 mg/dL  Results for CHESSIE, NEUHARTH (MRN [id number]) as of 05/05/2021 10:19  Ref. Range 05/04/2021 07:47 05/04/2021 11:45 05/04/2021 16:18 05/04/2021 21:07  Glucose-Capillary Latest Ref Range: 70 - 99 mg/dL 98 253 (H)  5 units Novolog  10 units Levemir  339 (H)  7 units Novolog  293 (H)     10 units Levemir  Results for JERIANN, SAYRES (MRN [id number]) as of 05/05/2021 10:19  Ref. Range 05/05/2021 07:57  Glucose-Capillary Latest Ref Range: 70 - 99 mg/dL 135 (H)    Home DM Meds: Levemir 8-12 units daily     Metformin 500 mg daily  Current Orders: Levemir 10 units BID Novolog 0-9 units TID    MD- Note Solumedrol reduced to 40 mg BID this AM (was 160 mg daily)  AM CBGs are OK--afternoon CBG elevated likely due to the steroids  Please consider starting Novolog Meal Coverage: Novolog 4 units TID with meals to start  Hold if pt eats <50% of meal, Hold if pt NPO    --Will follow patient during hospitalization--  Wyn Quaker RN, MSN, CDE Diabetes Coordinator Inpatient Glycemic Control Team Team Pager: [phone] (8a-5p)

## 2021-05-05 NOTE — Progress Notes (Signed)
[facility] PROGRESS NOTE   Angela Jensen IWP:[id number] DOB: March 25, 1956 DOA: 04/30/2021  PCP: [name], MD  Brief History/Interval Summary: 65 y.o. female with medical history significant for chronic hypoxic respiratory failure on 2 L continuous nasal cannula, COPD, type 2 diabetes mellitus, hypertension, chronic systolic heart failure, who is admitted to [facility] on 04/30/2021 with acute COPD exacerbation after presenting from home to [facility] ED complaining of shortness of breath.    Consultants: None  Procedures: None  Antibiotics: Anti-infectives (From admission, onward)    Start     Dose/Rate Route Frequency Ordered Stop   05/02/21 1315  doxycycline (VIBRAMYCIN) 100 mg in sodium chloride 0.9 % 250 mL IVPB        100 mg 125 mL/hr over 120 Minutes Intravenous Every 12 hours 05/02/21 1221     05/02/21 1000  ceFEPIme (MAXIPIME) 2 g in sodium chloride 0.9 % 100 mL IVPB  Status:  Discontinued        2 g 200 mL/hr over 30 Minutes Intravenous Every 12 hours 05/01/21 0117 05/02/21 1221   05/01/21 2300  vancomycin (VANCOREADY) IVPB 750 mg/150 mL  Status:  Discontinued        750 mg 150 mL/hr over 60 Minutes Intravenous Every 24 hours 05/01/21 0117 05/02/21 1221   04/30/21 2245  vancomycin (VANCOCIN) IVPB 1000 mg/200 mL premix        1,000 mg 200 mL/hr over 60 Minutes Intravenous  Once 04/30/21 2237 05/01/21 0133   04/30/21 2245  ceFEPIme (MAXIPIME) 2 g in sodium chloride 0.9 % 100 mL IVPB        2 g 200 mL/hr over 30 Minutes Intravenous  Once 04/30/21 2237 05/01/21 0034       Subjective/Interval History: Patient mentions that she had a better day yesterday.  Did not have any severe anxiety overnight.  Breathing is better.  Denies any chest pain.    Assessment/Plan:  COPD with acute exacerbation/chronic respiratory failure with hypoxia Patient with longstanding history of COPD on home oxygen at home at 2 to 3 L/min.  Noted to have wheezing when  admitted.  Started on nebulizer treatments and steroids.  CT angiogram did not show any PE.   Patient appears to develop anxiety mainly in the nighttime which triggers her shortness of breath.  She also feels that the nebulizer treatment she is getting is also contributing.   She requested we stop her steroid nebulizer.  We started budesonide and switch her back to her home inhaler.  She seems to have done better last 24 hours.  Remains on oxygen which is at her baseline. Improved air entry noted today.  We will start cutting back on steroids.  Continue to mobilize.  Acute anxiety Patient was started on alprazolam as needed and scheduled dose at bedtime.   Doing much better today.  Did not have any anxiety last night.  Continue to monitor.  If anxiety persists she will need to discuss this with her PCP and consider long-acting medication.  Current episode appears to be situational.   Healthcare associated pneumonia Chest x-ray raised concern for infectious process in the right lung.  She was recently hospitalized.   Patient placed on broad-spectrum antibiotics with vancomycin and cefepime.  Unfortunately no cultures were sent from the hospital.  Procalcitonin was less than 0.1.  CT angiogram does not show any clear-cut infiltrates.  Chronic scarring is noted.   Influenza and COVID-19 PCR's were negative. She was changed over  to doxycycline.  Changed to oral.  Mildly elevated troponin/chronic systolic CHF Very flat trend.  Likely mild demand ischemia.  Denies any anginal symptoms currently.  Do not anticipate any further testing at this time.  Recently done echocardiogram in August showed slightly diminished systolic function with a EF of 45 to 50% with global hypokinesis.  Do not see any cardiology notes in our system.   She will benefit from being seen by cardiologist in the outpatient setting.  No clear indication for inpatient consultation at this time.  Seems to be stable from a cardiac  standpoint.  No evidence for volume overload.  ACE inhibitor was resumed.  Started on low-dose carvedilol as well.    Diabetes mellitus type 2, uncontrolled with hyperglycemia Elevated glucose levels likely due to steroids.  Levemir was resumed.  HbA1c 8.2 when checked in August.  Elevated glucose levels likely due to steroids.   Should improve as steroid is tapered down.  No changes to Levemir today.    Essential hypertension Blood pressure is reasonably well controlled.  Remains on lisinopril and carvedilol at this time.  HCTZ is on hold.  Normocytic anemia Hemoglobin stable for the most part.  Anemia panel reviewed.  B12 level is 748.  Folic acid 62.7, ferritin 21, TIBC 395, percent saturation 10.  Could suggest iron deficiency.  She was on ferrous sulfate.  Dose was increased to twice a day.   May need further work-up in the outpatient setting which will be deferred to her PCP.Marland Kitchen  No overt blood loss noted.  Mild hyponatremia Continue to monitor.  History of peripheral artery disease Stable.  Continue Plavix.  DVT Prophylaxis: Lovenox Code Status: Full code Family Communication: Discussed with patient. Disposition Plan: Hopefully return home when improved.  Mobilize.  Status is: Inpatient  Remains inpatient appropriate because:IV treatments appropriate due to intensity of illness or inability to take PO and Inpatient level of care appropriate due to severity of illness  Dispo: The patient is from: Home              Anticipated d/c is to: Home              Patient currently is not medically stable to d/c.   Difficult to place patient No     Medications: Scheduled:  albuterol  2.5 mg Nebulization TID   ALPRAZolam  0.25 mg Oral QHS   carvedilol  3.125 mg Oral BID WC   clopidogrel  75 mg Oral Daily   enoxaparin (LOVENOX) injection  40 mg Subcutaneous Q24H   ferrous sulfate  324 mg Oral BID WC   fluticasone furoate-vilanterol  1 puff Inhalation Daily   gabapentin  100 mg  Oral BID   insulin aspart  0-9 Units Subcutaneous TID WC   insulin detemir  10 Units Subcutaneous BID   lisinopril  10 mg Oral Daily   umeclidinium bromide  1 puff Inhalation Daily   Continuous:  doxycycline (VIBRAMYCIN) IV Stopped (05/05/21 0207)   methylPREDNISolone (SOLU-MEDROL) injection 160 mg (05/05/21 0550)   [id number] **OR** acetaminophen, albuterol, ALPRAZolam   Objective:  Vital Signs  Vitals:   05/05/21 0000 05/05/21 0321 05/05/21 0500 05/05/21 0748  BP: 117/81 121/86    Pulse: 94 88    Resp: 17     Temp: 98.6 F (37 C) 98.9 F (37.2 C)    TempSrc: Oral Oral    SpO2: 99% 100%  96%  Weight:   56.7 kg   Height:  Intake/Output Summary (Last 24 hours) at 05/05/2021 1015 Last data filed at 05/05/2021 0600 Gross per 24 hour  Intake 500 ml  Output 500 ml  Net 0 ml    Filed Weights   05/03/21 0500 05/04/21 0515 05/05/21 0500  Weight: 52.6 kg 57.4 kg 56.7 kg    General appearance: Awake alert.  In no distress.  Less anxious Resp: Improved aeration bilaterally.  Less wheezing today.  Normal effort at rest. Cardio: S1-S2 is normal regular.  No S3-S4.  No rubs murmurs or bruit GI: Abdomen is soft.  Nontender nondistended.  Bowel sounds are present normal.  No masses organomegaly Extremities: No edema.  Full range of motion of lower extremities. Neurologic: Alert and oriented x3.  No focal neurological deficits.       Lab Results:  Data Reviewed: I have personally reviewed following labs and imaging studies  CBC: Recent Labs  Lab 04/30/21 2147 05/01/21 0619 05/02/21 0758 05/03/21 0417  WBC 6.2 8.5 9.7 10.4  HGB 10.1* 10.4* 9.9* 9.2*  HCT 31.8* 32.3* 31.2* 29.6*  MCV 91.9 92.0 93.1 92.8  PLT 273 280 272 263     Basic Metabolic Panel: Recent Labs  Lab 04/30/21 2147 04/30/21 2341 05/01/21 0619 05/02/21 0758 05/03/21 0417  NA 134*  --  133* 138 138  K 3.6  --  4.6 4.0 4.1  CL 100  --  101 103 103  CO2 27  --  25 31 32   GLUCOSE 174*  --  347* 151* 154*  BUN 19  --  16 19 23   CREATININE 0.60  --  0.58 0.45 0.50  CALCIUM 8.6*  --  8.8* 8.6* 8.4*  MG  --  2.0 2.0  --   --   PHOS  --   --  3.3  --   --      GFR: Estimated Creatinine Clearance: 53.8 mL/min (by C-G formula based on SCr of 0.5 mg/dL).  Liver Function Tests: Recent Labs  Lab 05/01/21 0619  AST 30  ALT 23  ALKPHOS 63  BILITOT 0.5  PROT 6.9  ALBUMIN 3.7       CBG: Recent Labs  Lab 05/04/21 0747 05/04/21 1145 05/04/21 1618 05/04/21 2107 05/05/21 0757  GLUCAP 98 253* 339* 293* 135*       Recent Results (from the past 240 hour(s))  Resp Panel by RT-PCR (Flu A&B, Covid) Nasopharyngeal Swab     Status: None   Collection Time: 04/30/21 10:24 PM   Specimen: Nasopharyngeal Swab; Nasopharyngeal(NP) swabs in vial transport medium  Result Value Ref Range Status   SARS Coronavirus 2 by RT PCR NEGATIVE NEGATIVE Final    Comment: (NOTE) SARS-CoV-2 target nucleic acids are NOT DETECTED.  The SARS-CoV-2 RNA is generally detectable in upper respiratory specimens during the acute phase of infection. The lowest concentration of SARS-CoV-2 viral copies this assay can detect is 138 copies/mL. A negative result does not preclude SARS-Cov-2 infection and should not be used as the sole basis for treatment or other patient management decisions. A negative result may occur with  improper specimen collection/handling, submission of specimen other than nasopharyngeal swab, presence of viral mutation(s) within the areas targeted by this assay, and inadequate number of viral copies(<138 copies/mL). A negative result must be combined with clinical observations, patient history, and epidemiological information. The expected result is Negative.  Fact Sheet for Patients:  EntrepreneurPulse.com.au  Fact Sheet for Healthcare Providers:  IncredibleEmployment.be  This test is no t yet approved or  cleared by  the [place] and  has been authorized for detection and/or diagnosis of SARS-CoV-2 by FDA under an Emergency Use Authorization (EUA). This EUA will remain  in effect (meaning this test can be used) for the duration of the COVID-19 declaration under Section 564(b)(1) of the Act, 21 U.S.C.section 360bbb-3(b)(1), unless the authorization is terminated  or revoked sooner.       Influenza A by PCR NEGATIVE NEGATIVE Final   Influenza B by PCR NEGATIVE NEGATIVE Final    Comment: (NOTE) The Xpert Xpress SARS-CoV-2/FLU/RSV plus assay is intended as an aid in the diagnosis of influenza from Nasopharyngeal swab specimens and should not be used as a sole basis for treatment. Nasal washings and aspirates are unacceptable for Xpert Xpress SARS-CoV-2/FLU/RSV testing.  Fact Sheet for Patients: EntrepreneurPulse.com.au  Fact Sheet for Healthcare Providers: IncredibleEmployment.be  This test is not yet approved or cleared by the [place] FDA and has been authorized for detection and/or diagnosis of SARS-CoV-2 by FDA under an Emergency Use Authorization (EUA). This EUA will remain in effect (meaning this test can be used) for the duration of the COVID-19 declaration under Section 564(b)(1) of the Act, 21 U.S.C. section 360bbb-3(b)(1), unless the authorization is terminated or revoked.  Performed at [facility], [address], [place], [place] [address]        Radiology Studies: No results found.     LOS: 5 days   Sabeen Piechocki [employer] on [email or web address]  05/05/2021, 10:15 AM

## 2021-05-06 ENCOUNTER — Inpatient Hospital Stay: Payer: Medicare Other

## 2021-05-06 DIAGNOSIS — J441 Chronic obstructive pulmonary disease with (acute) exacerbation: Secondary | ICD-10-CM | POA: Diagnosis not present

## 2021-05-06 DIAGNOSIS — E1165 Type 2 diabetes mellitus with hyperglycemia: Secondary | ICD-10-CM | POA: Diagnosis not present

## 2021-05-06 DIAGNOSIS — F419 Anxiety disorder, unspecified: Secondary | ICD-10-CM | POA: Diagnosis not present

## 2021-05-06 DIAGNOSIS — I10 Essential (primary) hypertension: Secondary | ICD-10-CM | POA: Diagnosis not present

## 2021-05-06 LAB — GLUCOSE, CAPILLARY
Glucose-Capillary: 147 mg/dL — ABNORMAL HIGH (ref 70–99)
Glucose-Capillary: 253 mg/dL — ABNORMAL HIGH (ref 70–99)
Glucose-Capillary: 188 mg/dL — ABNORMAL HIGH (ref 70–99)
Glucose-Capillary: 176 mg/dL — ABNORMAL HIGH (ref 70–99)

## 2021-05-06 MED ORDER — SALINE SPRAY 0.65 % NA SOLN
2.0000 | Freq: Four times a day (QID) | NASAL | Status: DC
  Administered 2021-05-06 – 2021-05-08 (×9): 2 via NASAL
  Filled 2021-05-06: qty 44

## 2021-05-06 MED ORDER — FLUTICASONE PROPIONATE 50 MCG/ACT NA SUSP
2.0000 | Freq: Every day | NASAL | Status: DC
  Administered 2021-05-06 – 2021-05-08 (×3): 2 via NASAL
  Filled 2021-05-06: qty 16

## 2021-05-06 MED ORDER — ESCITALOPRAM OXALATE 10 MG PO TABS
10.0000 mg | ORAL_TABLET | Freq: Every day | ORAL | Status: DC
  Administered 2021-05-06 – 2021-05-08 (×3): 10 mg via ORAL
  Filled 2021-05-06 (×3): qty 1

## 2021-05-06 MED ORDER — INSULIN ASPART 100 UNIT/ML IJ SOLN
3.0000 [IU] | Freq: Three times a day (TID) | INTRAMUSCULAR | Status: DC
  Administered 2021-05-06 – 2021-05-07 (×4): 3 [IU] via SUBCUTANEOUS
  Filled 2021-05-06 (×5): qty 1

## 2021-05-06 NOTE — Progress Notes (Signed)
Inpatient Diabetes Program Recommendations  AACE/ADA: New Consensus Statement on Inpatient Glycemic Control (2015)  Target Ranges:  Prepandial:   less than 140 mg/dL      Peak postprandial:   less than 180 mg/dL (1-2 hours)      Critically ill patients:  140 - 180 mg/dL   Lab Results  Component Value Date   GLUCAP 147 (H) 05/06/2021   HGBA1C 8.2 (H) 04/02/2021    Review of Glycemic Control Results for Angela Jensen, Angela Jensen (MRN [id number]) as of 05/06/2021 10:44  Ref. Range 05/05/2021 07:57 05/05/2021 11:57 05/05/2021 16:26 05/05/2021 20:26 05/06/2021 07:31  Glucose-Capillary Latest Ref Range: 70 - 99 mg/dL 135 (H) 290 (H) 412 (H) 248 (H) 147 (H)    Inpatient Diabetes Program Recommendations:   Postprandial CBGs elevated. Please consider starting Novolog Meal Coverage: Novolog 4 units TID with meals to start   Hold if pt eats <50% of meal, Hold if pt NPO Secure chat sent to Dr. Maryland Pink.  Thank you, Nani Gasser. Ethelmae Ringel, RN, MSN, CDE  Diabetes Coordinator Inpatient Glycemic Control Team Team Pager [phone] (8am-5pm) 05/06/2021 10:45 AM

## 2021-05-06 NOTE — Progress Notes (Signed)
[facility] PROGRESS NOTE   Angela Jensen [id number]:[id number] DOB: Dec 25, 1955 DOA: 04/30/2021  PCP: [name], MD  Brief History/Interval Summary: 65 y.o. female with medical history significant for chronic hypoxic respiratory failure on 2 L continuous nasal cannula, COPD, type 2 diabetes mellitus, hypertension, chronic systolic heart failure, who is admitted to [facility] on 04/30/2021 with acute COPD exacerbation after presenting from home to [facility] ED complaining of shortness of breath.   Hospitalization has been complicated by episodes of anxiety and panic attacks.   Consultants: None  Procedures: None  Antibiotics: Anti-infectives (From admission, onward)    Start     Dose/Rate Route Frequency Ordered Stop   05/05/21 1115  doxycycline (VIBRA-TABS) tablet 100 mg        100 mg Oral Every 12 hours 05/05/21 1020 05/07/21 0959   05/02/21 1315  doxycycline (VIBRAMYCIN) 100 mg in sodium chloride 0.9 % 250 mL IVPB  Status:  Discontinued        100 mg 125 mL/hr over 120 Minutes Intravenous Every 12 hours 05/02/21 1221 05/05/21 1020   05/02/21 1000  ceFEPIme (MAXIPIME) 2 g in sodium chloride 0.9 % 100 mL IVPB  Status:  Discontinued        2 g 200 mL/hr over 30 Minutes Intravenous Every 12 hours 05/01/21 0117 05/02/21 1221   05/01/21 2300  vancomycin (VANCOREADY) IVPB 750 mg/150 mL  Status:  Discontinued        750 mg 150 mL/hr over 60 Minutes Intravenous Every 24 hours 05/01/21 0117 05/02/21 1221   04/30/21 2245  vancomycin (VANCOCIN) IVPB 1000 mg/200 mL premix        1,000 mg 200 mL/hr over 60 Minutes Intravenous  Once 04/30/21 2237 05/01/21 0133   04/30/21 2245  ceFEPIme (MAXIPIME) 2 g in sodium chloride 0.9 % 100 mL IVPB        2 g 200 mL/hr over 30 Minutes Intravenous  Once 04/30/21 2237 05/01/21 0034       Subjective/Interval History: Patient once again experienced anxiety and shortness of breath last night.  She felt that she was not getting enough  oxygen through her nasal cannula.  She was placed on a Ventimask.  Her significant other is at the bedside.  She denies any chest pain.  Feels better this morning.    Assessment/Plan:  COPD with acute exacerbation/chronic respiratory failure with hypoxia Patient with longstanding history of COPD on home oxygen at home at 2 to 3 L/min.  Noted to have wheezing when admitted.  Started on nebulizer treatments and steroids.  CT angiogram did not show any PE.   Patient noted to have panic episodes at night.  She was started on as needed alprazolam and a dose at bedtime daily.  Patient seemed to be doing better in the last 24 hours however yesterday again she had a similar episode.  She is currently on Ventimask because she feels that she is not getting enough air through her nasal cannula. We will apply humidifier to oxygen.  We will give her nasal sprays. She also requested that we stop her steroid nebulizer which was done 2 days ago. She was switched back to her home inhaler. No significant wheezing appreciated today.  Air entry is about the same as yesterday.  We will repeat a chest x-ray to make sure there is no new abnormality.  Continue current dose of steroids for now.  Continue to mobilize.  Acute anxiety Patient was started on alprazolam as  needed and scheduled dose at bedtime.  Was doing better but then had recurrence of her anxiety last night.  Continue with alprazolam.  If the becomes a recurrent issue then may have to consider more long-acting medication as well.  However this appears to be mostly a situational anxiety.  Healthcare associated pneumonia Chest x-ray raised concern for infectious process in the right lung.  She was recently hospitalized.   Patient placed on broad-spectrum antibiotics with vancomycin and cefepime.  Unfortunately no cultures were sent from the hospital.  Procalcitonin was less than 0.1.  CT angiogram does not show any clear-cut infiltrates.  Chronic scarring is  noted.   Influenza and COVID-19 PCR's were negative. She was changed over to doxycycline.   Mildly elevated troponin/chronic systolic CHF Very flat trend.  Likely mild demand ischemia.  Denies any anginal symptoms currently.  Do not anticipate any further testing at this time.  Recently done echocardiogram in August showed slightly diminished systolic function with a EF of 45 to 50% with global hypokinesis.  Do not see any cardiology notes in our system.   She will benefit from being seen by cardiologist in the outpatient setting.  No clear indication for inpatient consultation at this time.  Seems to be stable from a cardiac standpoint.  No evidence for volume overload.   ACE inhibitor was resumed.  Started on low-dose carvedilol as well.    Diabetes mellitus type 2, uncontrolled with hyperglycemia HbA1c 8.2 when checked in August.  Elevated glucose levels likely due to steroids.   Decrease in dose of steroid CBG appears to have improved.  Continue to monitor for now.  We will repeat blood work tomorrow.  Continue current dose of Levemir.  Essential hypertension Blood pressure is reasonably well controlled.  Remains on lisinopril and carvedilol at this time.  HCTZ is on hold.  Normocytic anemia Hemoglobin stable for the most part.  Anemia panel reviewed.  B12 level is 748.  Folic acid 16.1, ferritin 21, TIBC 395, percent saturation 10.  Could suggest iron deficiency.  She was on ferrous sulfate.  Dose was increased to twice a day.   May need further work-up in the outpatient setting which will be deferred to her PCP.  No overt blood loss noted.  Mild hyponatremia Seems to have resolved.  History of peripheral artery disease Stable.  Continue Plavix.  DVT Prophylaxis: Lovenox Code Status: Full code Family Communication: Discussed with patient. Disposition Plan: Hopefully return home when improved.  Mobilize.  Status is: Inpatient  Remains inpatient appropriate because:IV treatments  appropriate due to intensity of illness or inability to take PO and Inpatient level of care appropriate due to severity of illness  Dispo: The patient is from: Home              Anticipated d/c is to: Home              Patient currently is not medically stable to d/c.   Difficult to place patient No     Medications: Scheduled:  albuterol  2.5 mg Nebulization TID   ALPRAZolam  0.25 mg Oral QHS   carvedilol  3.125 mg Oral BID WC   clopidogrel  75 mg Oral Daily   doxycycline  100 mg Oral Q12H   enoxaparin (LOVENOX) injection  40 mg Subcutaneous Q24H   ferrous sulfate  324 mg Oral BID WC   fluticasone furoate-vilanterol  1 puff Inhalation Daily   gabapentin  100 mg Oral BID   insulin aspart  0-20 Units Subcutaneous TID WC   insulin aspart  0-5 Units Subcutaneous QHS   insulin detemir  10 Units Subcutaneous BID   lisinopril  10 mg Oral Daily   methylPREDNISolone (SOLU-MEDROL) injection  40 mg Intravenous Q12H   umeclidinium bromide  1 puff Inhalation Daily   Continuous:   [id number] **OR** acetaminophen, albuterol, ALPRAZolam   Objective:  Vital Signs  Vitals:   05/05/21 2320 05/06/21 0349 05/06/21 0614 05/06/21 0729  BP: 131/64 138/87  134/85  Pulse: 85 78  72  Resp: 18 18  17   Temp:    98 F (36.7 C)  TempSrc:      SpO2: 100% 99%  100%  Weight:   55.9 kg   Height:        Intake/Output Summary (Last 24 hours) at 05/06/2021 1040 Last data filed at 05/06/2021 0349 Gross per 24 hour  Intake 360 ml  Output 1400 ml  Net -1040 ml    Filed Weights   05/04/21 0515 05/05/21 0500 05/06/21 0614  Weight: 57.4 kg 56.7 kg 55.9 kg    General appearance: Awake alert.  In no distress.  Seems to be anxious Resp: Diminished air entry but similar to before.  Scattered wheezing.  Normal effort at rest. Cardio: S1-S2 is normal regular.  No S3-S4.  No rubs murmurs or bruit GI: Abdomen is soft.  Nontender nondistended.  Bowel sounds are present normal.  No masses  organomegaly Extremities: No edema.  Full range of motion of lower extremities. Neurologic: Alert and oriented x3.  No focal neurological deficits.      Lab Results:  Data Reviewed: I have personally reviewed following labs and imaging studies  CBC: Recent Labs  Lab 04/30/21 2147 05/01/21 0619 05/02/21 0758 05/03/21 0417  WBC 6.2 8.5 9.7 10.4  HGB 10.1* 10.4* 9.9* 9.2*  HCT 31.8* 32.3* 31.2* 29.6*  MCV 91.9 92.0 93.1 92.8  PLT 273 280 272 263     Basic Metabolic Panel: Recent Labs  Lab 04/30/21 2147 04/30/21 2341 05/01/21 0619 05/02/21 0758 05/03/21 0417  NA 134*  --  133* 138 138  K 3.6  --  4.6 4.0 4.1  CL 100  --  101 103 103  CO2 27  --  25 31 32  GLUCOSE 174*  --  347* 151* 154*  BUN 19  --  16 19 23   CREATININE 0.60  --  0.58 0.45 0.50  CALCIUM 8.6*  --  8.8* 8.6* 8.4*  MG  --  2.0 2.0  --   --   PHOS  --   --  3.3  --   --      GFR: Estimated Creatinine Clearance: 53.5 mL/min (by C-G formula based on SCr of 0.5 mg/dL).  Liver Function Tests: Recent Labs  Lab 05/01/21 0619  AST 30  ALT 23  ALKPHOS 63  BILITOT 0.5  PROT 6.9  ALBUMIN 3.7       CBG: Recent Labs  Lab 05/05/21 0757 05/05/21 1157 05/05/21 1626 05/05/21 2026 05/06/21 0731  GLUCAP 135* 290* 412* 248* 147*       Recent Results (from the past 240 hour(s))  Resp Panel by RT-PCR (Flu A&B, Covid) Nasopharyngeal Swab     Status: None   Collection Time: 04/30/21 10:24 PM   Specimen: Nasopharyngeal Swab; Nasopharyngeal(NP) swabs in vial transport medium  Result Value Ref Range Status   SARS Coronavirus 2 by RT PCR NEGATIVE NEGATIVE Final    Comment: (NOTE) SARS-CoV-2 target nucleic  acids are NOT DETECTED.  The SARS-CoV-2 RNA is generally detectable in upper respiratory specimens during the acute phase of infection. The lowest concentration of SARS-CoV-2 viral copies this assay can detect is 138 copies/mL. A negative result does not preclude SARS-Cov-2 infection and  should not be used as the sole basis for treatment or other patient management decisions. A negative result may occur with  improper specimen collection/handling, submission of specimen other than nasopharyngeal swab, presence of viral mutation(s) within the areas targeted by this assay, and inadequate number of viral copies(<138 copies/mL). A negative result must be combined with clinical observations, patient history, and epidemiological information. The expected result is Negative.  Fact Sheet for Patients:  EntrepreneurPulse.com.au  Fact Sheet for Healthcare Providers:  IncredibleEmployment.be  This test is no t yet approved or cleared by the [place] FDA and  has been authorized for detection and/or diagnosis of SARS-CoV-2 by FDA under an Emergency Use Authorization (EUA). This EUA will remain  in effect (meaning this test can be used) for the duration of the COVID-19 declaration under Section 564(b)(1) of the Act, 21 U.S.C.section 360bbb-3(b)(1), unless the authorization is terminated  or revoked sooner.       Influenza A by PCR NEGATIVE NEGATIVE Final   Influenza B by PCR NEGATIVE NEGATIVE Final    Comment: (NOTE) The Xpert Xpress SARS-CoV-2/FLU/RSV plus assay is intended as an aid in the diagnosis of influenza from Nasopharyngeal swab specimens and should not be used as a sole basis for treatment. Nasal washings and aspirates are unacceptable for Xpert Xpress SARS-CoV-2/FLU/RSV testing.  Fact Sheet for Patients: EntrepreneurPulse.com.au  Fact Sheet for Healthcare Providers: IncredibleEmployment.be  This test is not yet approved or cleared by the [place] FDA and has been authorized for detection and/or diagnosis of SARS-CoV-2 by FDA under an Emergency Use Authorization (EUA). This EUA will remain in effect (meaning this test can be used) for the duration of the COVID-19 declaration  under Section 564(b)(1) of the Act, 21 U.S.C. section 360bbb-3(b)(1), unless the authorization is terminated or revoked.  Performed at [facility], [address], [place], [place] [address]        Radiology Studies: No results found.     LOS: 6 days   Dock Baccam [employer] on [email or web address]  05/06/2021, 10:40 AM

## 2021-05-06 NOTE — Progress Notes (Signed)
Physical Therapy Treatment Patient Details Name: Angela Jensen MRN: [id number] DOB: [birth year] Today's Date: 05/06/2021   History of Present Illness Angela Jensen is a 65 y.o. female with medical history significant for chronic hypoxic respiratory failure on 2 L continuous nasal cannula, COPD, type 2 diabetes mellitus, hypertension, chronic systolic heart failure, who is admitted to [facility] on 04/30/2021 with acute COPD exacerbation after presenting from home to [facility] ED complaining of shortness of breath.    PT Comments    Pt resting in bed upon PT arrival; agreeable to PT session.  Pt's O2 sats noted to be 80% at rest--nasal cannula noted to not be connected to supplemental O2 so therapist connected nasal cannula to 4 L supplemental O2 and O2 sats improved quickly to 95% at rest.  Pt on 4 L O2 during sessions activities.  Pt able to ambulate 75 feet (O2 sats 87% post ambulation) and then after toileting and sitting rest break ambulated 50 feet (O2 sats 92% post ambulation).  End of session pt's O2 sats 94% on 4 L O2 via nasal cannula.  Pt demonstrating generalized weakness and decreased activity tolerance; will continue to focus on strengthening, endurance, and progressive functional mobility per pt tolerance.    Recommendations for follow up therapy are one component of a multi-disciplinary discharge planning process, led by the attending physician.  Recommendations may be updated based on patient status, additional functional criteria and insurance authorization.  Follow Up Recommendations  Home health PT     Equipment Recommendations  None recommended by PT    Recommendations for Other Services       Precautions / Restrictions Precautions Precautions: Fall Restrictions Weight Bearing Restrictions: No     Mobility  Bed Mobility Overal bed mobility: Modified Independent             General bed mobility comments: Semi-supine to sitting without any noted  difficulty    Transfers Overall transfer level: Needs assistance Equipment used: None Transfers: Sit to/from Stand Sit to Stand: Supervision Stand pivot transfers: Supervision (to toilet in bathroom)       General transfer comment: SBA for safety  Ambulation/Gait Ambulation/Gait assistance: Supervision;Min guard Gait Distance (Feet):  (75 feet in room; 50 feet in room)   Gait Pattern/deviations: Step-through pattern Gait velocity: decreased   General Gait Details: slower cautious gait speed; narrow BOS   Stairs             Wheelchair Mobility    Modified Rankin (Stroke Patients Only)       Balance Overall balance assessment: Needs assistance Sitting-balance support: No upper extremity supported;Feet supported Sitting balance-Leahy Scale: Normal Sitting balance - Comments: steady sitting reaching outside BOS   Standing balance support: No upper extremity supported;During functional activity Standing balance-Leahy Scale: Good Standing balance comment: no loss of balance with ambulation                            Cognition Arousal/Alertness: Awake/alert Behavior During Therapy: WFL for tasks assessed/performed Overall Cognitive Status: Within Functional Limits for tasks assessed                                        Exercises      General Comments  Nursing cleared pt for participation in physical therapy.  Pt agreeable to PT session.  Pertinent Vitals/Pain Pain Assessment: No/denies pain HR WFL during sessions activities.    Home Living                      Prior Function            PT Goals (current goals can now be found in the care plan section) Acute Rehab PT Goals Patient Stated Goal: to go home PT Goal Formulation: With patient Time For Goal Achievement: 05/16/21 Potential to Achieve Goals: Good Progress towards PT goals: Progressing toward goals    Frequency    Min 2X/week      PT  Plan Current plan remains appropriate    Co-evaluation              AM-PAC PT "6 Clicks" Mobility   Outcome Measure  Help needed turning from your back to your side while in a flat bed without using bedrails?: None Help needed moving from lying on your back to sitting on the side of a flat bed without using bedrails?: None Help needed moving to and from a bed to a chair (including a wheelchair)?: A Little Help needed standing up from a chair using your arms (e.g., wheelchair or bedside chair)?: A Little Help needed to walk in hospital room?: A Little Help needed climbing 3-5 steps with a railing? : A Little 6 Click Score: 20    End of Session Equipment Utilized During Treatment: Gait belt;Oxygen Activity Tolerance: Patient tolerated treatment well Patient left: in chair;with call bell/phone within reach;with chair alarm set Nurse Communication: Mobility status;Precautions;Other (comment) (pt had bowel movement (dark stool noted)) PT Visit Diagnosis: Muscle weakness (generalized) (M62.81)     Time: 8527-7824 PT Time Calculation (min) (ACUTE ONLY): 46 min  Charges:  $Therapeutic Exercise: 8-22 mins $Therapeutic Activity: 23-37 mins                     Leitha Bleak, PT 05/06/21, 1:13 PM

## 2021-05-07 DIAGNOSIS — J441 Chronic obstructive pulmonary disease with (acute) exacerbation: Secondary | ICD-10-CM | POA: Diagnosis not present

## 2021-05-07 LAB — CBC
HCT: 27.6 % — ABNORMAL LOW (ref 36.0–46.0)
Hemoglobin: 8.8 g/dL — ABNORMAL LOW (ref 12.0–15.0)
MCH: 28.9 pg (ref 26.0–34.0)
MCHC: 31.9 g/dL (ref 30.0–36.0)
MCV: 90.5 fL (ref 80.0–100.0)
Platelets: 264 10*3/uL (ref 150–400)
RBC: 3.05 MIL/uL — ABNORMAL LOW (ref 3.87–5.11)
RDW: 13.9 % (ref 11.5–15.5)
WBC: 9.1 10*3/uL (ref 4.0–10.5)
nRBC: 0 % (ref 0.0–0.2)

## 2021-05-07 LAB — GLUCOSE, CAPILLARY
Glucose-Capillary: 199 mg/dL — ABNORMAL HIGH (ref 70–99)
Glucose-Capillary: 248 mg/dL — ABNORMAL HIGH (ref 70–99)
Glucose-Capillary: 234 mg/dL — ABNORMAL HIGH (ref 70–99)
Glucose-Capillary: 112 mg/dL — ABNORMAL HIGH (ref 70–99)
Glucose-Capillary: 116 mg/dL — ABNORMAL HIGH (ref 70–99)
Glucose-Capillary: 144 mg/dL — ABNORMAL HIGH (ref 70–99)

## 2021-05-07 LAB — BASIC METABOLIC PANEL
Anion gap: 3 — ABNORMAL LOW (ref 5–15)
BUN: 24 mg/dL — ABNORMAL HIGH (ref 8–23)
CO2: 33 mmol/L — ABNORMAL HIGH (ref 22–32)
Calcium: 7.9 mg/dL — ABNORMAL LOW (ref 8.9–10.3)
Chloride: 100 mmol/L (ref 98–111)
Creatinine, Ser: 0.49 mg/dL (ref 0.44–1.00)
GFR, Estimated: >60 mL/min (ref >60)
Glucose, Bld: 351 mg/dL — ABNORMAL HIGH (ref 70–99)
Potassium: 3.9 mmol/L (ref 3.5–5.1)
Sodium: 136 mmol/L (ref 135–145)

## 2021-05-07 MED ORDER — METHYLPREDNISOLONE SODIUM SUCC 40 MG IJ SOLR
40.0000 mg | INTRAMUSCULAR | Status: DC
  Administered 2021-05-08: 40 mg via INTRAVENOUS
  Filled 2021-05-07: qty 1

## 2021-05-07 NOTE — Progress Notes (Addendum)
Mobility Specialist - Progress Note   05/07/21 1100  Mobility  Activity Ambulated in room  Level of Assistance Standby assist, set-up cues, supervision of patient - no hands on  Assistive Device None  Distance Ambulated (ft) 90 ft  Mobility Ambulated with assistance in room  Mobility Response Tolerated well  Mobility performed by Mobility specialist  $Mobility charge 1 Mobility    Pre-mobility: 95 HR, 96% SpO2 During mobility: 103 HR, 88-90% SpO2 Post-mobility: 90 HR, 96% SpO2   Pt utilizing 3L on arrival. Ambulated in room with supervision, no LOB. Mild SOB. Winded post-activity. RPE 7/10. Pt returned to recliner after activity, alarm set.    Angela Jensen Mobility Specialist 05/07/21, 11:45 AM

## 2021-05-07 NOTE — Progress Notes (Signed)
PROGRESS NOTE    Angela Jensen   SWH:[id number]  DOB: 1955-11-13  PCP: [name], MD    DOA: 04/30/2021 LOS: 7    Brief Narrative / Hospital Course to Date:   65 year old female with past medical history of COPD with chronic respiratory failure with hypoxia on 2 L/min home oxygen, type 2 diabetes, chronic systolic CHF who was admitted to [facility] on 04/30/2021 with acute exacerbation of COPD after presenting from home to the ED with complaints of shortness of breath.  Hospital stay was complicated by episodes of anxiety with panic attacks patient has been started on medications for this.  Assessment & Plan   Principal Problem:   Acute exacerbation of chronic obstructive pulmonary disease (COPD) (HCC) Active Problems:   Essential hypertension   Diabetes (HCC)   Elevated troponin   SOB (shortness of breath)   HCAP (healthcare-associated pneumonia)   Chronic systolic CHF (congestive heart failure) (HCC)   COPD with acute exacerbation -presented with shortness of breath and wheezing on exam.   CTA chest negative for PE. 9/21: No wheezing on exam but aeration is poor --Continue Solu-Medrol, decrease to 40 mg daily from twice daily --Continue Incruse and Breo --As needed albuterol --OOB and mobilize  Chronic respiratory failure with hypoxia -Baseline O2 requirement 2 to 3 L/min. 9/21: On 4 L/min O2.  Patient has periodically required oxygen above her typical baseline because it seems to help with her panic attack episodes and anxiety.  -- Supplemental O2 to maintain sats above 88% --Ocean and Flonase nasal sprays  Healthcare associated pneumonia -with recent admission for this.  Chest x-ray concerning for possible right-sided pneumonia.  Patient was started on vancomycin and cefepime.  No cultures available from prior admission.  Pro-Cal negative.  CTA chest without infiltrates, stable chronic scarring was seen.  Negative for influenza and COVID-19. --Antibiotics were de-escalated  to doxycycline and course has been completed  Anxiety with panic attacks -appears stable today.  Per report patient seems to have situational anxiety with episodes of panic Patient has been started on Lexapro 10 mg daily and low-dose Xanax as needed.    Troponin elevation due to demand ischemia -trend was flat patient without chest pain or acute ischemic changes on EKG. Chronic systolic CHF -EF 45 to 66% with global LV hypokinesis on echo in August. Currently euvolemic and compensated, no evidence for volume overload. --Would benefit by following with cardiology outpatient --Started on lower dose Coreg, continue --Continue home ACE inhibitor  Type 2 diabetes: Uncontrolled, hyperglycemia -hemoglobin A1c 8.2% in August. Hyperglycemia likely steroid induced. --Continue Levemir --Monitor CBGs and adjust insulin for inpatient goal 140-180  Essential hypertension -resumed on lisinopril, started on low-dose Coreg.  HCTZ held.  Monitor BP.  Normocytic anemia -hemoglobin stable.  Anemia panel suggestive of iron deficiency.  No evidence of active blood loss. -- Oral iron supplement increased to twice daily --PCP follow-up  Mild hyponatremia -resolved.  Monitor BMP  History of PAD -stable without acute issues.  Continue Plavix    Patient BMI: Body mass index is 24.89 kg/m.   DVT prophylaxis: enoxaparin (LOVENOX) injection 40 mg Start: 05/01/21 0945 SCDs Start: 04/30/21 2308   Diet:  Diet Orders (From admission, onward)     Start     Ordered   05/01/21 1200  Diet Carb Modified Fluid consistency: Thin; Room service appropriate? Yes  Diet effective now       Question Answer Comment  Diet-HS Snack? Nothing   Calorie Level Medium 1600-2000  Fluid consistency: Thin   Room service appropriate? Yes      05/01/21 1159              Code Status: Full Code   Subjective 05/07/21    Patient seen this morning at bedside and reports ongoing dry cough.  No fevers or chills.  Feeling a  little less anxious today but did have some panic overnight although not as severe as previous episodes.  Does endorse feeling quite weak and more fatigued than at her baseline.   Disposition Plan & Communication   Status is: Inpatient  Remains inpatient appropriate because:IV treatments appropriate due to intensity of illness or inability to take PO.  Anticipate DC home with home health PT tomorrow  Dispo:  Patient From: Home  Planned Disposition: Home with Health Care Svc  Medically stable for discharge: No      Consults, Procedures, Significant Events   Consultants:  None  Procedures:  None  Antimicrobials:  Anti-infectives (From admission, onward)    Start     Dose/Rate Route Frequency Ordered Stop   05/05/21 1115  doxycycline (VIBRA-TABS) tablet 100 mg        100 mg Oral Every 12 hours 05/05/21 1020 05/06/21 2131   05/02/21 1315  doxycycline (VIBRAMYCIN) 100 mg in sodium chloride 0.9 % 250 mL IVPB  Status:  Discontinued        100 mg 125 mL/hr over 120 Minutes Intravenous Every 12 hours 05/02/21 1221 05/05/21 1020   05/02/21 1000  ceFEPIme (MAXIPIME) 2 g in sodium chloride 0.9 % 100 mL IVPB  Status:  Discontinued        2 g 200 mL/hr over 30 Minutes Intravenous Every 12 hours 05/01/21 0117 05/02/21 1221   05/01/21 2300  vancomycin (VANCOREADY) IVPB 750 mg/150 mL  Status:  Discontinued        750 mg 150 mL/hr over 60 Minutes Intravenous Every 24 hours 05/01/21 0117 05/02/21 1221   04/30/21 2245  vancomycin (VANCOCIN) IVPB 1000 mg/200 mL premix        1,000 mg 200 mL/hr over 60 Minutes Intravenous  Once 04/30/21 2237 05/01/21 0133   04/30/21 2245  ceFEPIme (MAXIPIME) 2 g in sodium chloride 0.9 % 100 mL IVPB        2 g 200 mL/hr over 30 Minutes Intravenous  Once 04/30/21 2237 05/01/21 0034         Micro    Objective   Vitals:   05/07/21 0350 05/07/21 0731 05/07/21 0733 05/07/21 1038  BP: 129/86 130/77  136/70  Pulse: 79 83  88  Resp: 16 17  17   Temp: 97.8  F (36.6 C) 98.7 F (37.1 C)  98.7 F (37.1 C)  TempSrc:      SpO2: 100% 100% 100% 100%  Weight:      Height:        Intake/Output Summary (Last 24 hours) at 05/07/2021 1322 Last data filed at 05/07/2021 1011 Gross per 24 hour  Intake 720 ml  Output 905 ml  Net -185 ml   Filed Weights   05/04/21 0515 05/05/21 0500 05/06/21 0614  Weight: 57.4 kg 56.7 kg 55.9 kg    Physical Exam:  General exam: awake, alert, no acute distress HEENT: atraumatic, clear conjunctiva, anicteric sclera, moist mucus membranes, hearing grossly normal  Respiratory system: Poor air movement without audible expiratory wheezes, no rales or rhonchi heard, normal respiratory effort.  On 4 L/min nasal cannula oxygen Cardiovascular system: normal S1/S2, RRR, no JVD,  murmurs, rubs, gallops, no pedal edema.   Gastrointestinal system: soft, NT, ND, no HSM felt, +bowel sounds. Central nervous system: A&O x3. no gross focal neurologic deficits, normal speech Extremities: moves all, no edema, normal tone Psychiatry: normal mood, congruent affect, judgement and insight appear normal  Labs   Data Reviewed: I have personally reviewed following labs and imaging studies  CBC: Recent Labs  Lab 04/30/21 2147 05/01/21 0619 05/02/21 0758 05/03/21 0417 05/07/21 0333  WBC 6.2 8.5 9.7 10.4 9.1  HGB 10.1* 10.4* 9.9* 9.2* 8.8*  HCT 31.8* 32.3* 31.2* 29.6* 27.6*  MCV 91.9 92.0 93.1 92.8 90.5  PLT 273 280 272 263 440   Basic Metabolic Panel: Recent Labs  Lab 04/30/21 2147 04/30/21 2341 05/01/21 0619 05/02/21 0758 05/03/21 0417 05/07/21 0333  NA 134*  --  133* 138 138 136  K 3.6  --  4.6 4.0 4.1 3.9  CL 100  --  101 103 103 100  CO2 27  --  25 31 32 33*  GLUCOSE 174*  --  347* 151* 154* 351*  BUN 19  --  16 19 23  24*  CREATININE 0.60  --  0.58 0.45 0.50 0.49  CALCIUM 8.6*  --  8.8* 8.6* 8.4* 7.9*  MG  --  2.0 2.0  --   --   --   PHOS  --   --  3.3  --   --   --    GFR: Estimated Creatinine Clearance: 53.5  mL/min (by C-G formula based on SCr of 0.49 mg/dL). Liver Function Tests: Recent Labs  Lab 05/01/21 0619  AST 30  ALT 23  ALKPHOS 63  BILITOT 0.5  PROT 6.9  ALBUMIN 3.7   No results for input(s): LIPASE, AMYLASE in the last 168 hours. No results for input(s): AMMONIA in the last 168 hours. Coagulation Profile: No results for input(s): INR, PROTIME in the last 168 hours. Cardiac Enzymes: No results for input(s): CKTOTAL, CKMB, CKMBINDEX, TROPONINI in the last 168 hours. BNP (last 3 results) No results for input(s): PROBNP in the last 8760 hours. HbA1C: No results for input(s): HGBA1C in the last 72 hours. CBG: Recent Labs  Lab 05/06/21 1533 05/06/21 2052 05/07/21 0733 05/07/21 1026 05/07/21 1313  GLUCAP 188* 176* 199* 248* 234*   Lipid Profile: No results for input(s): CHOL, HDL, LDLCALC, TRIG, CHOLHDL, LDLDIRECT in the last 72 hours. Thyroid Function Tests: No results for input(s): TSH, T4TOTAL, FREET4, T3FREE, THYROIDAB in the last 72 hours. Anemia Panel: No results for input(s): VITAMINB12, FOLATE, FERRITIN, TIBC, IRON, RETICCTPCT in the last 72 hours. Sepsis Labs: Recent Labs  Lab 04/30/21 2341  PROCALCITON <0.10    Recent Results (from the past 240 hour(s))  Resp Panel by RT-PCR (Flu A&B, Covid) Nasopharyngeal Swab     Status: None   Collection Time: 04/30/21 10:24 PM   Specimen: Nasopharyngeal Swab; Nasopharyngeal(NP) swabs in vial transport medium  Result Value Ref Range Status   SARS Coronavirus 2 by RT PCR NEGATIVE NEGATIVE Final    Comment: (NOTE) SARS-CoV-2 target nucleic acids are NOT DETECTED.  The SARS-CoV-2 RNA is generally detectable in upper respiratory specimens during the acute phase of infection. The lowest concentration of SARS-CoV-2 viral copies this assay can detect is 138 copies/mL. A negative result does not preclude SARS-Cov-2 infection and should not be used as the sole basis for treatment or other patient management decisions. A  negative result may occur with  improper specimen collection/handling, submission of specimen other than nasopharyngeal  swab, presence of viral mutation(s) within the areas targeted by this assay, and inadequate number of viral copies(<138 copies/mL). A negative result must be combined with clinical observations, patient history, and epidemiological information. The expected result is Negative.  Fact Sheet for Patients:  EntrepreneurPulse.com.au  Fact Sheet for Healthcare Providers:  IncredibleEmployment.be  This test is no t yet approved or cleared by the [place] FDA and  has been authorized for detection and/or diagnosis of SARS-CoV-2 by FDA under an Emergency Use Authorization (EUA). This EUA will remain  in effect (meaning this test can be used) for the duration of the COVID-19 declaration under Section 564(b)(1) of the Act, 21 U.S.C.section 360bbb-3(b)(1), unless the authorization is terminated  or revoked sooner.       Influenza A by PCR NEGATIVE NEGATIVE Final   Influenza B by PCR NEGATIVE NEGATIVE Final    Comment: (NOTE) The Xpert Xpress SARS-CoV-2/FLU/RSV plus assay is intended as an aid in the diagnosis of influenza from Nasopharyngeal swab specimens and should not be used as a sole basis for treatment. Nasal washings and aspirates are unacceptable for Xpert Xpress SARS-CoV-2/FLU/RSV testing.  Fact Sheet for Patients: EntrepreneurPulse.com.au  Fact Sheet for Healthcare Providers: IncredibleEmployment.be  This test is not yet approved or cleared by the [place] FDA and has been authorized for detection and/or diagnosis of SARS-CoV-2 by FDA under an Emergency Use Authorization (EUA). This EUA will remain in effect (meaning this test can be used) for the duration of the COVID-19 declaration under Section 564(b)(1) of the Act, 21 U.S.C. section 360bbb-3(b)(1), unless the authorization  is terminated or revoked.  Performed at [facility], [address], [place], [place] [address]       Imaging Studies   DG Chest Bee 1 View  Result Date: 05/06/2021 CLINICAL DATA:  Dyspnea. EXAM: PORTABLE CHEST 1 VIEW COMPARISON:  May 01, 2021. FINDINGS: The heart size and mediastinal contours are within normal limits. Left lung is clear. Slightly decreased right basilar opacity is noted suggesting improving atelectasis or infiltrate. The visualized skeletal structures are unremarkable. IMPRESSION: Slightly decreased right basilar opacity as described above. Electronically Signed   By: Marijo Conception M.D.   On: 05/06/2021 14:40     Medications   Scheduled Meds:  albuterol  2.5 mg Nebulization TID   ALPRAZolam  0.25 mg Oral QHS   carvedilol  3.125 mg Oral BID WC   clopidogrel  75 mg Oral Daily   enoxaparin (LOVENOX) injection  40 mg Subcutaneous Q24H   escitalopram  10 mg Oral Daily   ferrous sulfate  324 mg Oral BID WC   fluticasone  2 spray Each Nare Daily   fluticasone furoate-vilanterol  1 puff Inhalation Daily   gabapentin  100 mg Oral BID   insulin aspart  0-20 Units Subcutaneous TID WC   insulin aspart  0-5 Units Subcutaneous QHS   insulin aspart  3 Units Subcutaneous TID WC   insulin detemir  10 Units Subcutaneous BID   lisinopril  10 mg Oral Daily   methylPREDNISolone (SOLU-MEDROL) injection  40 mg Intravenous Q12H   sodium chloride  2 spray Each Nare QID   umeclidinium bromide  1 puff Inhalation Daily   Continuous Infusions:     LOS: 7 days    Time spent: 30 minutes    Ezekiel Slocumb, DO [facility]  05/07/2021, 1:22 PM      If 7PM-7AM, please contact night-coverage. How to contact the [facility] Attending or Consulting provider 7A -  7P or covering provider during after hours [place], for this patient?    Check the care team in [facility] and look for a) attending/consulting TR[facility] provider listed and b) the [facility] team listed Log into  [email or web address] and use [place]'s universal password to access. If you do not have the password, please contact the hospital operator. Locate the [facility] provider you are looking for under [facility] and page to a number that you can be directly reached. If you still have difficulty reaching the provider, please page the [facility] (Director on Call) for the Hospitalists listed on amion for assistance.

## 2021-05-08 DIAGNOSIS — J441 Chronic obstructive pulmonary disease with (acute) exacerbation: Secondary | ICD-10-CM | POA: Diagnosis not present

## 2021-05-08 LAB — BASIC METABOLIC PANEL
Anion gap: 6 (ref 5–15)
BUN: 19 mg/dL (ref 8–23)
CO2: 34 mmol/L — ABNORMAL HIGH (ref 22–32)
Calcium: 8.2 mg/dL — ABNORMAL LOW (ref 8.9–10.3)
Chloride: 100 mmol/L (ref 98–111)
Creatinine, Ser: 0.48 mg/dL (ref 0.44–1.00)
GFR, Estimated: >60 mL/min (ref >60)
Glucose, Bld: 96 mg/dL (ref 70–99)
Potassium: 3.3 mmol/L — ABNORMAL LOW (ref 3.5–5.1)
Sodium: 140 mmol/L (ref 135–145)

## 2021-05-08 LAB — CBC
HCT: 28.3 % — ABNORMAL LOW (ref 36.0–46.0)
Hemoglobin: 8.9 g/dL — ABNORMAL LOW (ref 12.0–15.0)
MCH: 29.5 pg (ref 26.0–34.0)
MCHC: 31.4 g/dL (ref 30.0–36.0)
MCV: 93.7 fL (ref 80.0–100.0)
Platelets: 261 10*3/uL (ref 150–400)
RBC: 3.02 MIL/uL — ABNORMAL LOW (ref 3.87–5.11)
RDW: 14 % (ref 11.5–15.5)
WBC: 6.8 10*3/uL (ref 4.0–10.5)
nRBC: 0 % (ref 0.0–0.2)

## 2021-05-08 LAB — GLUCOSE, CAPILLARY
Glucose-Capillary: 76 mg/dL (ref 70–99)
Glucose-Capillary: 357 mg/dL — ABNORMAL HIGH (ref 70–99)

## 2021-05-08 MED ORDER — FERROUS SULFATE 27 MG PO TABS: 324.0000 mg | ORAL_TABLET | Freq: Two times a day (BID) | ORAL | 1 refills | Status: DC

## 2021-05-08 MED ORDER — ALPRAZOLAM 0.25 MG PO TABS: 0.2500 mg | ORAL_TABLET | Freq: Three times a day (TID) | ORAL | 0 refills | Status: DC | PRN

## 2021-05-08 MED ORDER — ESCITALOPRAM OXALATE 10 MG PO TABS: 10.0000 mg | ORAL_TABLET | Freq: Every day | ORAL | 2 refills | Status: DC

## 2021-05-08 MED ORDER — CARVEDILOL 3.125 MG PO TABS: 3.1250 mg | ORAL_TABLET | Freq: Two times a day (BID) | ORAL | 1 refills | Status: DC

## 2021-05-08 MED ORDER — LISINOPRIL 10 MG PO TABS: 10.0000 mg | ORAL_TABLET | Freq: Every day | ORAL | 2 refills | Status: DC

## 2021-05-08 MED ORDER — PREDNISONE 10 MG PO TABS: ORAL_TABLET | ORAL | 0 refills | Status: AC

## 2021-05-08 MED ORDER — FLUTICASONE PROPIONATE 50 MCG/ACT NA SUSP: 2.0000 | Freq: Every day | NASAL | 2 refills | Status: DC

## 2021-05-08 MED ORDER — PREDNISONE 20 MG PO TABS
30.0000 mg | ORAL_TABLET | Freq: Every day | ORAL | Status: DC
  Administered 2021-05-08: 30 mg via ORAL
  Filled 2021-05-08: qty 1

## 2021-05-08 NOTE — Progress Notes (Signed)
Mobility Specialist - Progress Note   05/08/21 1100  Mobility  Activity Ambulated in room  Level of Assistance Independent  Assistive Device None  Distance Ambulated (ft) 50 ft  Mobility Ambulated independently in room  Mobility Response Tolerated well  Mobility performed by Mobility specialist  $Mobility charge 1 Mobility    Pre-mobility: 88 HR, 96% SpO2 During mobility: 86% SpO2 Post-mobility: 69 HR, 94% SpO2   Pt ambulated in room independently. O2 desat to 86% on 3L, denied SOB. Pt returned to recliner after activity. No complaints.    Kathee Delton Mobility Specialist 05/08/21, 11:39 AM

## 2021-05-08 NOTE — Clinical Social Work Note (Signed)
DATE ___9/22/22__________________________ PATIENT NAME__[name]_____________________   PATIENT MRN___[id number]_________________  DIAGNOSIS/DIAGNOSIS CODE _ Acute exacerbation of chronic obstructive pulmonary disease (COPD) ([place]) J44.1_______________  DATE OF DISCHARGE: ___9/22/22___________ PRIMARY CARE PHYSICIAN __Fozia Khan__________     PCP PHONE/FAX__[phone]_________________________     Dear Provider (Name: _______________________________   Fax: ___________________________):   I certify that I have examined this patient and that occupational/physical/speech therapy is necessary on an outpatient basis.   The patient has expressed interest in completing their recommended course of therapy at your  location.  Once a formal order from the patient's primary care physician has been obtained, please  contact him/her to schedule an appointment for evaluation at your earliest convenience.   [ * ]  Physical Therapy Evaluate and Treat          [  ]  Occupational Therapy Evaluate and Treat                        [  ]  Speech Therapy Evaluate and Treat         The patient's primary care physician (listed above) must furnish and be responsible for a formal order such that the recommended services may be furnished while under the primary physician's care, and that the plan of care will be established and reviewed every 30 days (or more often if condition necessitates).  MD electronic signature below.

## 2021-05-08 NOTE — Discharge Summary (Signed)
Physician Discharge Summary  ARDINE IACOVELLI JKD:[id number] DOB: [birth year] DOA: 04/30/2021  PCP: [name], MD  Admit date: 04/30/2021 Discharge date: 05/08/2021  Admitted From: Home Disposition:  Home  Recommendations for Outpatient Follow-up:  Follow up with PCP in 1-2 weeks Please obtain BMP/CBC in one week Please follow up patient's anxiety with new medications started this admission  Home Health: PT  Equipment/Devices: None   Discharge Condition: Stable  CODE STATUS: Full  Diet recommendation: Heart Healthy / Carb Modified     Discharge Diagnoses: Principal Problem:   Acute exacerbation of chronic obstructive pulmonary disease (COPD) ([place]) Active Problems:   Essential hypertension   Diabetes ([place])   Elevated troponin   SOB (shortness of breath)   HCAP (healthcare-associated pneumonia)   Chronic systolic CHF (congestive heart failure) ([place])    Summary of HPI and Hospital Course:  64 year old female with past medical history of COPD with chronic respiratory failure with hypoxia on 2 L/min home oxygen, type 2 diabetes, chronic systolic CHF who was admitted to [facility] on 04/30/2021 with acute exacerbation of COPD after presenting from home to the ED with complaints of shortness of breath.  Hospital stay was complicated by episodes of anxiety with panic attacks patient has been started on medications for this.    New meds for anxiety - Lexapro and low dose PRN Xanax  COPD with acute exacerbation -presented with shortness of breath and wheezing on exam.   CTA chest negative for PE. 9/21: No wheezing on exam but aeration is poor Treated with IV Solu-Medrol and transitioned to Prednisone Discharged on 3 day prednisone taper 30/20/10 mg. Treated with Incruse and Breo, as needed albuterol   Chronic respiratory failure with hypoxia -Baseline O2 requirement 2 to 3 L/min. 9/21: On 4 L/min O2.  Patient has periodically required oxygen above her typical baseline because it seems  to help with her panic attack episodes and anxiety.  -- Supplemental O2 to maintain sats above 88% --Ocean and Flonase nasal sprays   Healthcare associated pneumonia -with recent admission for this.  Chest x-ray concerning for possible right-sided pneumonia.  Patient was started on vancomycin and cefepime.  No cultures available from prior admission.  Pro-Cal negative.  CTA chest without infiltrates, stable chronic scarring was seen.  Negative for influenza and COVID-19. --Antibiotics were de-escalated to doxycycline and course has been completed   Anxiety with panic attacks -improved and stable.  Patient seems to have situational anxiety with episodes of panic.   Patient has been started on Lexapro 10 mg daily and low-dose Xanax as needed.   --PCP and/or psychiatry follow up   Troponin elevation due to demand ischemia -trend was flat patient without chest pain or acute ischemic changes on EKG.  Chronic systolic CHF -EF 45 to 09% with global LV hypokinesis on echo in August. Currently euvolemic and compensated, no evidence for volume overload. --Would benefit by following with cardiology outpatient --Started on lower dose Coreg, continue --Continue home ACE inhibitor   Type 2 diabetes: Uncontrolled, hyperglycemia -hemoglobin A1c 8.2% in August. Hyperglycemia likely steroid induced. --Continue Levemir --Monitor CBGs and adjust insulin for inpatient goal 140-180   Essential hypertension -resumed on lisinopril, started on low-dose Coreg.  HCTZ held.  Monitor BP.  Normocytic anemia -hemoglobin stable.  Anemia panel suggestive of iron deficiency.  No evidence of active blood loss. -- Oral iron supplement increased to twice daily --PCP follow-up   Mild hyponatremia -resolved.  Monitor BMP  History of PAD -stable without acute issues.  Continue Plavix      Discharge Instructions   Discharge Instructions     (HEART FAILURE PATIENTS) Call MD:  Anytime you have any of the following  symptoms: 1) 3 pound weight gain in 24 hours or 5 pounds in 1 week 2) shortness of breath, with or without a dry hacking cough 3) swelling in the hands, feet or stomach 4) if you have to sleep on extra pillows at night in order to breathe.   Complete by: As directed    Call MD for:   Complete by: As directed    Worsening shortness of breath, wheezing or needing more oxygen to keep your Oxygen level above 88%   Call MD for:  extreme fatigue   Complete by: As directed    Call MD for:  persistant dizziness or light-headedness   Complete by: As directed    Call MD for:  persistant nausea and vomiting   Complete by: As directed    Call MD for:  severe uncontrolled pain   Complete by: As directed    Call MD for:  temperature >100.4   Complete by: As directed    Diet - low sodium heart healthy   Complete by: As directed    Discharge instructions   Complete by: As directed    Take Prednisone for three more days, starting tomorrow with breakfast. You will taper off prednisone, with last dose on Sunday morning. Please let your doctor know if you start getting more short of breath, of if wheezing increases when the prednisone is finished.   -- For Anxiety --   We have started a daily medication called Lexapro (escitalopram).  This medicine takes several weeks to feel its full benefit, so continue to take it every day.  Call your doctor if you are feeling any bothersome side effects.  I sent prescription for small dose of AS NEEDED Xanax (alprazolam) for acute episodes of anxiety or panic attacks.   Please limit using Xanax as much as possible to prevent side effects or increasing tolerance as we discussed this morning.   Increase activity slowly   Complete by: As directed       Allergies as of 05/08/2021   No Known Allergies      Medication List     STOP taking these medications    ferrous sulfate 324 MG Tbec Replaced by: Ferrous Sulfate 27 MG Tabs   lisinopril-hydrochlorothiazide  10-12.5 MG tablet Commonly known as: ZESTORETIC   nitrofurantoin (macrocrystal-monohydrate) 100 MG capsule Commonly known as: Macrobid       TAKE these medications    Accu-Chek Guide test strip Generic drug: glucose blood Use as instructed twice a daily DX E11.65   Accu-Chek Guide w/Device Kit Use as directed Dx e11.65   Accu-Chek Softclix Lancets lancets Use as instructed for twice a daily Dx E11.65   albuterol 108 (90 Base) MCG/ACT inhaler Commonly known as: VENTOLIN HFA Inhale 2 puffs into the lungs every 6 (six) hours as needed for wheezing or shortness of breath.   albuterol (2.5 MG/3ML) 0.083% nebulizer solution Commonly known as: PROVENTIL USE 1 VIAL IN NEBULIZER EVERY 6 HOURS AS NEEDED FOR WHEEZING   ALPRAZolam 0.25 MG tablet Commonly known as: XANAX Take 1 tablet (0.25 mg total) by mouth 3 (three) times daily as needed for anxiety or sleep.   carvedilol 3.125 MG tablet Commonly known as: COREG Take 1 tablet (3.125 mg total) by mouth 2 (two) times daily with a meal.   clopidogrel  75 MG tablet Commonly known as: Plavix Take 1 tablet (75 mg total) by mouth daily.   escitalopram 10 MG tablet Commonly known as: LEXAPRO Take 1 tablet (10 mg total) by mouth daily.   Ferrous Sulfate 27 MG Tabs Take 12 tablets (324 mg total) by mouth 2 (two) times daily with a meal. Replaces: ferrous sulfate 324 MG Tbec   Fish Oil 1000 MG Caps Take 1 capsule by mouth daily.   fluticasone 50 MCG/ACT nasal spray Commonly known as: FLONASE Place 2 sprays into both nostrils daily. Start taking on: May 09, 2021   gabapentin 100 MG capsule Commonly known as: NEURONTIN Take 1 capsule (100 mg total) by mouth 2 (two) times daily.   Levemir FlexTouch 100 UNIT/ML FlexPen Generic drug: insulin detemir Inject 8- 12 units with supper qd// with needles   lisinopril 10 MG tablet Commonly known as: ZESTRIL Take 1 tablet (10 mg total) by mouth daily. Start taking on: May 09, 2021   meloxicam 7.5 MG tablet Commonly known as: MOBIC Take 1 tablet (7.5 mg total) by mouth 2 (two) times daily as needed for pain. Home med.   metFORMIN 500 MG tablet Commonly known as: GLUCOPHAGE Take 1 tablet (500 mg total) by mouth daily with breakfast.   potassium chloride 10 MEQ tablet Commonly known as: KLOR-CON TAKE 1 TABLET BY MOUTH EVERY OTHER DAY   predniSONE 10 MG tablet Commonly known as: DELTASONE Take 3 tablets (30 mg total) by mouth daily with breakfast for 1 day, THEN 2 tablets (20 mg total) daily with breakfast for 1 day, THEN 1 tablet (10 mg total) daily with breakfast for 1 day. Start taking on: May 09, 2021   Trelegy Ellipta 100-62.5-25 MCG/INH Aepb Generic drug: Fluticasone-Umeclidin-Vilant Inhale 1 puff into the lungs daily.        No Known Allergies   If you experience worsening of your admission symptoms, develop shortness of breath, life threatening emergency, suicidal or homicidal thoughts you must seek medical attention immediately by calling 911 or calling your MD immediately  if symptoms less severe.    Please note   You were cared for by a hospitalist during your hospital stay. If you have any questions about your discharge medications or the care you received while you were in the hospital after you are discharged, you can call the unit and asked to speak with the hospitalist on call if the hospitalist that took care of you is not available. Once you are discharged, your primary care physician will handle any further medical issues. Please note that NO REFILLS for any discharge medications will be authorized once you are discharged, as it is imperative that you return to your primary care physician (or establish a relationship with a primary care physician if you do not have one) for your aftercare needs so that they can reassess your need for medications and monitor your lab values.   Consultations: None    Procedures/Studies: DG  Chest 2 View  Result Date: 04/30/2021 CLINICAL DATA:  Shortness of breath. EXAM: CHEST - 2 VIEW COMPARISON:  March 31, 2021 FINDINGS: Emphysematous lung disease is seen involving the bilateral upper lobes. Stable, mild to moderate severity linear scarring and/or atelectasis is seen within the right lung base. A mild amount of superimposed right basilar infiltrate is noted. This represents a new finding. There is no evidence of a pleural effusion or pneumothorax. The heart size and mediastinal contours are within normal limits. The visualized skeletal structures are unremarkable.  IMPRESSION: 1. Stable, mild to moderate severity right basilar linear scarring and/or atelectasis. 2. Mild superimposed right basilar infiltrate. Electronically Signed   By: Virgina Norfolk M.D.   On: 04/30/2021 21:47   CT Angio Chest Pulmonary Embolism (PE) W or WO Contrast  Result Date: 05/02/2021 CLINICAL DATA:  Shortness of breath EXAM: CT ANGIOGRAPHY CHEST WITH CONTRAST TECHNIQUE: Multidetector CT imaging of the chest was performed using the standard protocol during bolus administration of intravenous contrast. Multiplanar CT image reconstructions and MIPs were obtained to evaluate the vascular anatomy. CONTRAST:  7m OMNIPAQUE IOHEXOL 350 MG/ML SOLN COMPARISON:  CT chest dated 03/31/2021 FINDINGS: Cardiovascular: Satisfactory opacification the bilateral pulmonary arteries to the lobar level. However, evaluation is constrained by respiratory motion, particularly in the lower lobes. Within that constraint, there is no convincing evidence of pulmonary embolism. Suspected artifact within a segmental right lower lobe pulmonary artery (series 8/image 167) in an area of motion degradation. Although not tailored for evaluation of the thoracic aorta, there is no evidence of thoracic aortic aneurysm or dissection. Atherosclerotic calcifications of the aortic arch. Heart is normal in size.  No pericardial effusion. Mediastinum/Nodes: No  suspicious mediastinal lymphadenopathy. Visualized thyroid is unremarkable. Lungs/Pleura: Scarring in the right lung apex (series 9/image 16). Platelike scarring in the inferior right middle lobe (series 9/image 46). Scarring/radiation changes in the right lower lobe (series 9/image 60). These findings are unchanged from the prior. Moderate centrilobular and paraseptal emphysematous changes, upper lung predominant. No suspicious pulmonary nodules. Mild bibasilar atelectasis. No pleural effusion or pneumothorax. Upper Abdomen: Visualized upper abdomen is grossly unremarkable. Musculoskeletal: Post treatment changes involving the right lateral 7th rib (series 7/image 48), unchanged. Mild degenerative changes of the lower thoracic spine. Review of the MIP images confirms the above findings. IMPRESSION: No evidence of central pulmonary embolism, noting motion degradation. Scarring/radiation changes in the right hemithorax, unchanged. Aortic Atherosclerosis (ICD10-I70.0) and Emphysema (ICD10-J43.9). Electronically Signed   By: SJulian HyM.D.   On: 05/02/2021 00:13   DG Chest Port 1 View  Result Date: 05/06/2021 CLINICAL DATA:  Dyspnea. EXAM: PORTABLE CHEST 1 VIEW COMPARISON:  May 01, 2021. FINDINGS: The heart size and mediastinal contours are within normal limits. Left lung is clear. Slightly decreased right basilar opacity is noted suggesting improving atelectasis or infiltrate. The visualized skeletal structures are unremarkable. IMPRESSION: Slightly decreased right basilar opacity as described above. Electronically Signed   By: JMarijo ConceptionM.D.   On: 05/06/2021 14:40   DG Chest Port 1 View  Result Date: 05/01/2021 CLINICAL DATA:  Hypoxia EXAM: PORTABLE CHEST 1 VIEW COMPARISON:  04/30/2021 FINDINGS: Right lower lung scarring. Subpleural reticulation/fibrosis at the lung bases. No pleural effusion or pneumothorax. The heart is normal in size. IMPRESSION: Bilateral lower lung scarring. No  evidence of acute cardiopulmonary disease. Electronically Signed   By: SJulian HyM.D.   On: 05/01/2021 21:15       Subjective: Pt says she's feeling well this AM.  No episodes of feeling panicked in past 24 hours.  She reports some mild SOB, but feels at her baseline.  She denies other acute complaints.   Discharge Exam: Vitals:   05/08/21 0328 05/08/21 1018  BP: 118/73 127/79  Pulse: 76 85  Resp: 18   Temp: 97.9 F (36.6 C) 98.1 F (36.7 C)  SpO2: 100% 98%   Vitals:   05/07/21 2349 05/08/21 0328 05/08/21 0548 05/08/21 1018  BP: 118/80 118/73  127/79  Pulse: 83 76  85  Resp: 16 18  Temp: 98.1 F (36.7 C) 97.9 F (36.6 C)  98.1 F (36.7 C)  TempSrc: Oral Oral    SpO2: 100% 100%  98%  Weight:   55.7 kg   Height:        General: Pt is alert, awake, not in acute distress Cardiovascular: RRR, S1/S2 +, no rubs, no gallops Respiratory: CTA bilaterally, no wheezing, no rhonchi Abdominal: Soft, NT, ND, bowel sounds + Extremities: no edema, no cyanosis    The results of significant diagnostics from this hospitalization (including imaging, microbiology, ancillary and laboratory) are listed below for reference.     Microbiology: Recent Results (from the past 240 hour(s))  Resp Panel by RT-PCR (Flu A&B, Covid) Nasopharyngeal Swab     Status: None   Collection Time: 04/30/21 10:24 PM   Specimen: Nasopharyngeal Swab; Nasopharyngeal(NP) swabs in vial transport medium  Result Value Ref Range Status   SARS Coronavirus 2 by RT PCR NEGATIVE NEGATIVE Final    Comment: (NOTE) SARS-CoV-2 target nucleic acids are NOT DETECTED.  The SARS-CoV-2 RNA is generally detectable in upper respiratory specimens during the acute phase of infection. The lowest concentration of SARS-CoV-2 viral copies this assay can detect is 138 copies/mL. A negative result does not preclude SARS-Cov-2 infection and should not be used as the sole basis for treatment or other patient management  decisions. A negative result may occur with  improper specimen collection/handling, submission of specimen other than nasopharyngeal swab, presence of viral mutation(s) within the areas targeted by this assay, and inadequate number of viral copies(<138 copies/mL). A negative result must be combined with clinical observations, patient history, and epidemiological information. The expected result is Negative.  Fact Sheet for Patients:  EntrepreneurPulse.com.au  Fact Sheet for Healthcare Providers:  IncredibleEmployment.be  This test is no t yet approved or cleared by the [place] FDA and  has been authorized for detection and/or diagnosis of SARS-CoV-2 by FDA under an Emergency Use Authorization (EUA). This EUA will remain  in effect (meaning this test can be used) for the duration of the COVID-19 declaration under Section 564(b)(1) of the Act, 21 U.S.C.section 360bbb-3(b)(1), unless the authorization is terminated  or revoked sooner.       Influenza A by PCR NEGATIVE NEGATIVE Final   Influenza B by PCR NEGATIVE NEGATIVE Final    Comment: (NOTE) The Xpert Xpress SARS-CoV-2/FLU/RSV plus assay is intended as an aid in the diagnosis of influenza from Nasopharyngeal swab specimens and should not be used as a sole basis for treatment. Nasal washings and aspirates are unacceptable for Xpert Xpress SARS-CoV-2/FLU/RSV testing.  Fact Sheet for Patients: EntrepreneurPulse.com.au  Fact Sheet for Healthcare Providers: IncredibleEmployment.be  This test is not yet approved or cleared by the [place] FDA and has been authorized for detection and/or diagnosis of SARS-CoV-2 by FDA under an Emergency Use Authorization (EUA). This EUA will remain in effect (meaning this test can be used) for the duration of the COVID-19 declaration under Section 564(b)(1) of the Act, 21 U.S.C. section 360bbb-3(b)(1), unless the  authorization is terminated or revoked.  Performed at [facility], [address] [place],  [address]      Labs: BNP (last 3 results) Recent Labs    04/30/21 2147 05/01/21 2021  BNP 29.3 02.7   Basic Metabolic Panel: Recent Labs  Lab 05/02/21 0758 05/03/21 0417 05/07/21 0333 05/08/21 0419  NA 138 138 136 140  K 4.0 4.1 3.9 3.3*  CL 103 103 100 100  CO2 31 32 33* 34*  GLUCOSE 151* 154* 351* 96  BUN 19 23 24* 19  CREATININE 0.45 0.50 0.49 0.48  CALCIUM 8.6* 8.4* 7.9* 8.2*   Liver Function Tests: No results for input(s): AST, ALT, ALKPHOS, BILITOT, PROT, ALBUMIN in the last 168 hours. No results for input(s): LIPASE, AMYLASE in the last 168 hours. No results for input(s): AMMONIA in the last 168 hours. CBC: Recent Labs  Lab 05/02/21 0758 05/03/21 0417 05/07/21 0333 05/08/21 0419  WBC 9.7 10.4 9.1 6.8  HGB 9.9* 9.2* 8.8* 8.9*  HCT 31.2* 29.6* 27.6* 28.3*  MCV 93.1 92.8 90.5 93.7  PLT 272 263 264 261   Cardiac Enzymes: No results for input(s): CKTOTAL, CKMB, CKMBINDEX, TROPONINI in the last 168 hours. BNP: Invalid input(s): POCBNP CBG: Recent Labs  Lab 05/07/21 1313 05/07/21 1457 05/07/21 1601 05/07/21 2105 05/08/21 0740  GLUCAP 234* 112* 116* 144* 76   D-Dimer No results for input(s): DDIMER in the last 72 hours. Hgb A1c No results for input(s): HGBA1C in the last 72 hours. Lipid Profile No results for input(s): CHOL, HDL, LDLCALC, TRIG, CHOLHDL, LDLDIRECT in the last 72 hours. Thyroid function studies No results for input(s): TSH, T4TOTAL, T3FREE, THYROIDAB in the last 72 hours.  Invalid input(s): FREET3 Anemia work up No results for input(s): VITAMINB12, FOLATE, FERRITIN, TIBC, IRON, RETICCTPCT in the last 72 hours. Urinalysis    Component Value Date/Time   COLORURINE YELLOW (A) 06/30/2020 1325   APPEARANCEUR Clear 07/29/2020 1137   LABSPEC 1.023 06/30/2020 1325   PHURINE 5.0 06/30/2020 1325   GLUCOSEU Negative  07/29/2020 1137   HGBUR NEGATIVE 06/30/2020 1325   BILIRUBINUR negative 04/22/2021 0959   BILIRUBINUR Negative 07/29/2020 1137   KETONESUR 5 (A) 06/30/2020 1325   PROTEINUR Positive (A) 04/22/2021 0959   PROTEINUR Negative 07/29/2020 1137   PROTEINUR NEGATIVE 06/30/2020 1325   UROBILINOGEN 0.2 04/22/2021 0959   NITRITE neg 04/22/2021 0959   NITRITE Negative 07/29/2020 1137   NITRITE NEGATIVE 06/30/2020 1325   LEUKOCYTESUR Negative 04/22/2021 0959   LEUKOCYTESUR Negative 07/29/2020 1137   LEUKOCYTESUR NEGATIVE 06/30/2020 1325   Sepsis Labs Invalid input(s): PROCALCITONIN,  WBC,  LACTICIDVEN Microbiology Recent Results (from the past 240 hour(s))  Resp Panel by RT-PCR (Flu A&B, Covid) Nasopharyngeal Swab     Status: None   Collection Time: 04/30/21 10:24 PM   Specimen: Nasopharyngeal Swab; Nasopharyngeal(NP) swabs in vial transport medium  Result Value Ref Range Status   SARS Coronavirus 2 by RT PCR NEGATIVE NEGATIVE Final    Comment: (NOTE) SARS-CoV-2 target nucleic acids are NOT DETECTED.  The SARS-CoV-2 RNA is generally detectable in upper respiratory specimens during the acute phase of infection. The lowest concentration of SARS-CoV-2 viral copies this assay can detect is 138 copies/mL. A negative result does not preclude SARS-Cov-2 infection and should not be used as the sole basis for treatment or other patient management decisions. A negative result may occur with  improper specimen collection/handling, submission of specimen other than nasopharyngeal swab, presence of viral mutation(s) within the areas targeted by this assay, and inadequate number of viral copies(<138 copies/mL). A negative result must be combined with clinical observations, patient history, and epidemiological information. The expected result is Negative.  Fact Sheet for Patients:  EntrepreneurPulse.com.au  Fact Sheet for Healthcare Providers:   IncredibleEmployment.be  This test is no t yet approved or cleared by the [place] FDA and  has been authorized for detection and/or diagnosis of SARS-CoV-2 by FDA under an Emergency Use Authorization (EUA). This EUA will remain  in effect (meaning this test can be used) for the duration of the COVID-19 declaration under Section 564(b)(1) of the Act, 21 U.S.C.section 360bbb-3(b)(1), unless the authorization is terminated  or revoked sooner.       Influenza A by PCR NEGATIVE NEGATIVE Final   Influenza B by PCR NEGATIVE NEGATIVE Final    Comment: (NOTE) The Xpert Xpress SARS-CoV-2/FLU/RSV plus assay is intended as an aid in the diagnosis of influenza from Nasopharyngeal swab specimens and should not be used as a sole basis for treatment. Nasal washings and aspirates are unacceptable for Xpert Xpress SARS-CoV-2/FLU/RSV testing.  Fact Sheet for Patients: EntrepreneurPulse.com.au  Fact Sheet for Healthcare Providers: IncredibleEmployment.be  This test is not yet approved or cleared by the [place] FDA and has been authorized for detection and/or diagnosis of SARS-CoV-2 by FDA under an Emergency Use Authorization (EUA). This EUA will remain in effect (meaning this test can be used) for the duration of the COVID-19 declaration under Section 564(b)(1) of the Act, 21 U.S.C. section 360bbb-3(b)(1), unless the authorization is terminated or revoked.  Performed at [facility], [address] [place], [place] [address]      Time coordinating discharge: Over 30 minutes  SIGNED:   Ezekiel Slocumb, DO [facility] 05/08/2021, 10:31 AM   If 7PM-7AM, please contact night-coverage [email or web address]

## 2021-05-08 NOTE — Progress Notes (Signed)
Discussed discharge instruction with patient and family, including medications and follow up appointments.

## 2021-05-09 ENCOUNTER — Ambulatory Visit: Payer: Medicare Other | Admitting: Physician Assistant

## 2021-05-19 ENCOUNTER — Other Ambulatory Visit: Payer: Self-pay | Admitting: Nurse Practitioner

## 2021-05-19 DIAGNOSIS — J449 Chronic obstructive pulmonary disease, unspecified: Secondary | ICD-10-CM

## 2021-05-21 ENCOUNTER — Ambulatory Visit: Payer: Medicare Other | Admitting: Internal Medicine

## 2021-05-21 ENCOUNTER — Other Ambulatory Visit: Payer: Self-pay

## 2021-05-21 DIAGNOSIS — J449 Chronic obstructive pulmonary disease, unspecified: Secondary | ICD-10-CM

## 2021-05-21 DIAGNOSIS — R0602 Shortness of breath: Secondary | ICD-10-CM | POA: Diagnosis not present

## 2021-05-27 ENCOUNTER — Ambulatory Visit (INDEPENDENT_AMBULATORY_CARE_PROVIDER_SITE_OTHER): Payer: Medicare Other

## 2021-05-27 ENCOUNTER — Ambulatory Visit (INDEPENDENT_AMBULATORY_CARE_PROVIDER_SITE_OTHER): Payer: Medicare Other | Admitting: Vascular Surgery

## 2021-05-27 ENCOUNTER — Other Ambulatory Visit: Payer: Self-pay

## 2021-05-27 ENCOUNTER — Encounter (INDEPENDENT_AMBULATORY_CARE_PROVIDER_SITE_OTHER): Payer: Self-pay | Admitting: Vascular Surgery

## 2021-05-27 VITALS — BP 160/92 | HR 89 | Resp 16 | Wt 117.2 lb

## 2021-05-27 DIAGNOSIS — E785 Hyperlipidemia, unspecified: Secondary | ICD-10-CM

## 2021-05-27 DIAGNOSIS — I739 Peripheral vascular disease, unspecified: Secondary | ICD-10-CM | POA: Diagnosis not present

## 2021-05-27 DIAGNOSIS — I10 Essential (primary) hypertension: Secondary | ICD-10-CM

## 2021-05-27 DIAGNOSIS — I70213 Atherosclerosis of native arteries of extremities with intermittent claudication, bilateral legs: Secondary | ICD-10-CM

## 2021-05-27 DIAGNOSIS — E1165 Type 2 diabetes mellitus with hyperglycemia: Secondary | ICD-10-CM | POA: Diagnosis not present

## 2021-05-27 DIAGNOSIS — Z794 Long term (current) use of insulin: Secondary | ICD-10-CM

## 2021-05-27 LAB — BLOOD GAS, VENOUS
Acid-base deficit: 2.5 mmol/L — ABNORMAL HIGH (ref 0.0–2.0)
Bicarbonate: 22.5 mmol/L (ref 20.0–28.0)
O2 Saturation: 98.9 %
Patient temperature: 37
pCO2, Ven: 39 mmHg — ABNORMAL LOW (ref 44.0–60.0)
pH, Ven: 7.37 (ref 7.250–7.430)
pO2, Ven: 131 mmHg — ABNORMAL HIGH (ref 32.0–45.0)

## 2021-05-27 NOTE — Progress Notes (Signed)
MRN : [id number]  Angela Jensen is a 65 y.o. (05/14/1956) female who presents with chief complaint of  Chief Complaint  Patient presents with   Follow-up    Ultrasound follow up  .  History of Present Illness: Patient returns today in follow up of her peripheral arterial disease.  She has undergone multiple revascularization procedures in the past.  She continues to have neuropathic symptoms in her feet and toes but does not describe claudication symptoms and the pain does not sound like ischemic rest pain.  No ulceration or infection.  Her ABIs today are slightly reduced at 0.84 on the right and 0.70 on the left but overall her digit pressures are roughly stable at 112 on the right and 69 on the left.  Current Outpatient Medications  Medication Sig Dispense Refill   Accu-Chek Softclix Lancets lancets Use as instructed for twice a daily Dx E11.65 100 each 1   albuterol (PROVENTIL) (2.5 MG/3ML) 0.083% nebulizer solution USE 1 VIAL IN NEBULIZER EVERY 6 HOURS AS NEEDED FOR WHEEZING 75 mL 3   albuterol (VENTOLIN HFA) 108 (90 Base) MCG/ACT inhaler Inhale 2 puffs into the lungs every 6 (six) hours as needed for wheezing or shortness of breath. 8 g 3   ALPRAZolam (XANAX) 0.25 MG tablet Take 1 tablet (0.25 mg total) by mouth 3 (three) times daily as needed for anxiety or sleep. 30 tablet 0   Blood Glucose Monitoring Suppl (ACCU-CHEK GUIDE) w/Device KIT Use as directed Dx e11.65 1 kit 0   carvedilol (COREG) 3.125 MG tablet Take 1 tablet (3.125 mg total) by mouth 2 (two) times daily with a meal. 60 tablet 1   clopidogrel (PLAVIX) 75 MG tablet Take 1 tablet (75 mg total) by mouth daily. 90 tablet 1   escitalopram (LEXAPRO) 10 MG tablet Take 1 tablet (10 mg total) by mouth daily. 30 tablet 2   ferrous sulfate 27 MG TABS Take 12 tablets (324 mg total) by mouth 2 (two) times daily with a meal. 60 tablet 1   fluticasone (FLONASE) 50 MCG/ACT nasal spray Place 2 sprays into both nostrils daily. 9.9 mL 2    Fluticasone-Umeclidin-Vilant (TRELEGY ELLIPTA) 100-62.5-25 MCG/INH AEPB Inhale 1 puff into the lungs daily. 1 each 11   gabapentin (NEURONTIN) 100 MG capsule Take 1 capsule (100 mg total) by mouth 2 (two) times daily. 90 capsule 3   glucose blood (ACCU-CHEK GUIDE) test strip Use as instructed twice a daily DX E11.65 100 each 1   insulin detemir (LEVEMIR FLEXTOUCH) 100 UNIT/ML FlexPen Inject 8- 12 units with supper qd// with needles 6 mL 11   lisinopril (ZESTRIL) 10 MG tablet Take 1 tablet (10 mg total) by mouth daily. 30 tablet 2   meloxicam (MOBIC) 7.5 MG tablet Take 1 tablet (7.5 mg total) by mouth 2 (two) times daily as needed for pain. Home med.     metFORMIN (GLUCOPHAGE) 500 MG tablet Take 1 tablet (500 mg total) by mouth daily with breakfast. 90 tablet 3   Omega-3 Fatty Acids (FISH OIL) 1000 MG CAPS Take 1 capsule by mouth daily.     OXYGEN Inhale into the lungs.     potassium chloride (KLOR-CON) 10 MEQ tablet TAKE 1 TABLET BY MOUTH EVERY OTHER DAY 45 tablet 3   No current facility-administered medications for this visit.    Past Medical History:  Diagnosis Date   Anemia    Arthritis    Asthma    Atherosclerosis of native arteries of extremity with  intermittent claudication ([place]) 05/26/2016   Cancer ([place]) 2012   Right Lung CA   COPD (chronic obstructive pulmonary disease) ([place])    Depression    Diabetes mellitus without complication ([place])    Patient takes Janumet   Essential hypertension 05/26/2016   Hypercholesteremia    Oxygen dependent    2L at nite    PAD (peripheral artery disease) ([place]) 06/22/2016   Peripheral vascular disease ([facility])    Personal history of radiation therapy    Shortness of breath dyspnea    with exertion    Sialolithiasis    Sleep apnea    Wears dentures    full upper and lower    Past Surgical History:  Procedure Laterality Date   CESAREAN SECTION     x3   COLONOSCOPY WITH PROPOFOL N/A 06/25/2015   Procedure: COLONOSCOPY WITH PROPOFOL;   Surgeon: Lucilla Lame, MD;  Location: [facility];  Service: Endoscopy;  Laterality: N/A;   COLONOSCOPY WITH PROPOFOL N/A 07/26/2020   Procedure: COLONOSCOPY WITH PROPOFOL;  Surgeon: Lucilla Lame, MD;  Location: [place];  Service: Endoscopy;  Laterality: N/A;   CYST REMOVAL LEG     and on shoulder    ESOPHAGOGASTRODUODENOSCOPY (EGD) WITH PROPOFOL N/A 07/26/2020   Procedure: ESOPHAGOGASTRODUODENOSCOPY (EGD) WITH PROPOFOL;  Surgeon: Lucilla Lame, MD;  Location: [place];  Service: Endoscopy;  Laterality: N/A;  Diabetic - oral meds   LOWER EXTREMITY ANGIOGRAPHY Left 09/29/2018   Procedure: LOWER EXTREMITY ANGIOGRAPHY;  Surgeon: Algernon Huxley, MD;  Location: [facility] [facility];  Service: Cardiovascular;  Laterality: Left;   LUNG BIOPSY  05 15 2013   has lung "spots"   PERIPHERAL VASCULAR CATHETERIZATION Left 06/01/2016   Procedure: Lower Extremity Angiography;  Surgeon: Algernon Huxley, MD;  Location: [place] [facility];  Service: Cardiovascular;  Laterality: Left;   PERIPHERAL VASCULAR CATHETERIZATION N/A 06/01/2016   Procedure: Abdominal Aortogram w/Lower Extremity;  Surgeon: Algernon Huxley, MD;  Location: [facility] [facility];  Service: Cardiovascular;  Laterality: N/A;   PERIPHERAL VASCULAR CATHETERIZATION  06/01/2016   Procedure: Lower Extremity Intervention;  Surgeon: Algernon Huxley, MD;  Location: [facility];  Service: Cardiovascular;;   PERIPHERAL VASCULAR CATHETERIZATION Right 06/08/2016   Procedure: Lower Extremity Angiography;  Surgeon: Algernon Huxley, MD;  Location: [facility];  Service: Cardiovascular;  Laterality: Right;   PERIPHERAL VASCULAR CATHETERIZATION  06/08/2016   Procedure: Lower Extremity Intervention;  Surgeon: Algernon Huxley, MD;  Location: [facility];  Service: Cardiovascular;;   SUBMANDIBULAR GLAND EXCISION Left 12/06/2020   Procedure: EXCISION SUBMANDIBULAR GLAND;  Surgeon: Beverly Gust, MD;  Location: [place];   Service: ENT;  Laterality: Left;  needs to be first case Diabetic - diet controlled     Social History   Tobacco Use   Smoking status: Former    Packs/day: 1.00    Years: 37.00    Pack years: 37.00    Types: Cigarettes    Quit date: 02/06/2010    Years since quitting: 11.3   Smokeless tobacco: Former    Types: Snuff  Vaping Use   Vaping Use: Never used  Substance Use Topics   Alcohol use: Not Currently    Alcohol/week: 5.0 standard drinks    Types: 5 Cans of beer per week    Comment: /h x of alcohol abuse -stopped 2012- now drinks 5 beer per week   Drug use: Not Currently    Types: Marijuana, "Crack" cocaine,  Cocaine    Comment: hx of cocaine use- last use 2015; last use marijuana6/22/19,      Family History  Problem Relation Age of Onset   Lung cancer Father    Diabetes Mother    Hypercholesterolemia Mother    Diabetes Sister    Diabetes Maternal Grandmother    Diabetes Paternal Grandmother    Diabetes Sister    Heart attack Brother    Coronary artery disease Brother    Vascular Disease Brother    Hypertension Sister    Heart attack Brother      No Known Allergies  REVIEW OF SYSTEMS (Negative unless checked)   Constitutional: '[]' Weight loss  '[]' Fever  '[]' Chills Cardiac: '[]' Chest pain   '[]' Chest pressure   '[]' Palpitations   '[]' Shortness of breath when laying flat   '[]' Shortness of breath at rest   '[x]' Shortness of breath with exertion. Vascular:  '[x]' Pain in legs with walking   '[]' Pain in legs at rest   '[]' Pain in legs when laying flat   '[x]' Claudication   '[]' Pain in feet when walking  '[]' Pain in feet at rest  '[]' Pain in feet when laying flat   '[]' History of DVT   '[]' Phlebitis   '[]' Swelling in legs   '[]' Varicose veins   '[]' Non-healing ulcers Pulmonary:   '[x]' Uses home oxygen   '[]' Productive cough   '[]' Hemoptysis   '[]' Wheeze  '[x]' COPD   '[]' Asthma Neurologic:  '[]' Dizziness  '[]' Blackouts   '[]' Seizures   '[]' History of stroke   '[]' History of TIA  '[]' Aphasia   '[]' Temporary blindness   '[]' Dysphagia    '[]' Weakness or numbness in arms   '[]' Weakness or numbness in legs Musculoskeletal:  '[x]' Arthritis   '[]' Joint swelling   '[]' Joint pain   '[]' Low back pain Hematologic:  '[]' Easy bruising  '[]' Easy bleeding   '[]' Hypercoagulable state   '[]' Anemic  '[]' Hepatitis Gastrointestinal:  '[]' Blood in stool   '[]' Vomiting blood  '[]' Gastroesophageal reflux/heartburn   '[]' Abdominal pain Genitourinary:  '[]' Chronic kidney disease   '[]' Difficult urination  '[]' Frequent urination  '[]' Burning with urination   '[]' Hematuria Skin:  '[]' Rashes   '[]' Ulcers   '[]' Wounds Psychological:  '[]' History of anxiety   '[]'  History of major depression.    Physical Examination  BP (!) 160/92 (BP Location: Right Arm)   Pulse 89   Resp 16   Wt 117 lb 3.2 oz (53.2 kg)   BMI 23.67 kg/m  Gen:  WD/WN, NAD.  Appears older than stated age Head: [place]/AT, No temporalis wasting. Ear/Nose/Throat: Hearing grossly intact, nares w/o erythema or drainage Eyes: Conjunctiva clear. Sclera non-icteric Neck: Supple.  Trachea midline Pulmonary:  Good air movement, no use of accessory muscles on supplemental oxygen.  Cardiac: RRR, no JVD Vascular:  Vessel Right Left  Radial Palpable Palpable                          PT 2+ Palpable 1+ Palpable  DP 2+ Palpable 2+ Palpable   Gastrointestinal: soft, non-tender/non-distended. No guarding/reflex.  Musculoskeletal: M/S 5/5 throughout.  No deformity or atrophy.  No significant lower extremity edema. Neurologic: Sensation grossly intact in extremities.  Symmetrical.  Speech is fluent.  Psychiatric: Judgment intact, Mood & affect appropriate for pt's clinical situation. Dermatologic: No rashes or ulcers noted.  No cellulitis or open wounds.      Labs Recent Results (from the past 2160 hour(s))  CBC with Differential/Platelet     Status: Abnormal   Collection Time: 03/20/21  9:33 AM  Result Value Ref Range   WBC 4.7  4.0 - 10.5 K/uL   RBC 4.08 3.87 - 5.11 MIL/uL   Hemoglobin 11.8 (L) 12.0 - 15.0 g/dL   HCT 36.4 36.0 -  46.0 %   MCV 89.2 80.0 - 100.0 fL   MCH 28.9 26.0 - 34.0 pg   MCHC 32.4 30.0 - 36.0 g/dL   RDW 13.8 11.5 - 15.5 %   Platelets 295 150 - 400 K/uL   nRBC 0.0 0.0 - 0.2 %   Neutrophils Relative % 60 %   Neutro Abs 2.8 1.7 - 7.7 K/uL   Lymphocytes Relative 24 %   Lymphs Abs 1.1 0.7 - 4.0 K/uL   Monocytes Relative 12 %   Monocytes Absolute 0.6 0.1 - 1.0 K/uL   Eosinophils Relative 4 %   Eosinophils Absolute 0.2 0.0 - 0.5 K/uL   Basophils Relative 0 %   Basophils Absolute 0.0 0.0 - 0.1 K/uL   Immature Granulocytes 0 %   Abs Immature Granulocytes 0.01 0.00 - 0.07 K/uL    Comment: Performed at [facility], [address] [place], [place] [address]  Comprehensive metabolic panel     Status: Abnormal   Collection Time: 03/20/21  9:33 AM  Result Value Ref Range   Sodium 134 (L) 135 - 145 mmol/L   Potassium 3.5 3.5 - 5.1 mmol/L   Chloride 97 (L) 98 - 111 mmol/L   CO2 28 22 - 32 mmol/L   Glucose, Bld 242 (H) 70 - 99 mg/dL    Comment: Glucose reference range applies only to samples taken after fasting for at least 8 hours.   BUN 15 8 - 23 mg/dL   Creatinine, Ser 0.61 0.44 - 1.00 mg/dL   Calcium 9.1 8.9 - 10.3 mg/dL   Total Protein 7.9 6.5 - 8.1 g/dL   Albumin 4.2 3.5 - 5.0 g/dL   AST 44 (H) 15 - 41 U/L   ALT 53 (H) 0 - 44 U/L   Alkaline Phosphatase 73 38 - 126 U/L   Total Bilirubin 0.5 0.3 - 1.2 mg/dL   GFR, Estimated >60 >60 mL/min    Comment: (NOTE) Calculated using the CKD-EPI Creatinine Equation (2021)    Anion gap 9 5 - 15    Comment: Performed at [facility], [address] [place], [place] [address]  Ferritin     Status: None   Collection Time: 03/20/21  9:33 AM  Result Value Ref Range   Ferritin 29 11 - 307 ng/mL    Comment: Performed at [facility], [address] [place], [place] [address]  CBC     Status: Abnormal   Collection Time: 03/31/21  2:44 PM  Result Value Ref Range   WBC 4.5 4.0 - 10.5 K/uL   RBC 3.56 (L) 3.87 - 5.11  MIL/uL   Hemoglobin 10.6 (L) 12.0 - 15.0 g/dL   HCT 33.5 (L) 36.0 - 46.0 %   MCV 94.1 80.0 - 100.0 fL   MCH 29.8 26.0 - 34.0 pg   MCHC 31.6 30.0 - 36.0 g/dL   RDW 13.6 11.5 - 15.5 %   Platelets 239 150 - 400 K/uL   nRBC 0.0 0.0 - 0.2 %    Comment: Performed at [facility], [address] [place], [place] [address]  Basic metabolic panel     Status: Abnormal   Collection Time: 03/31/21  2:44 PM  Result Value Ref Range   Sodium 136 135 - 145 mmol/L   Potassium 3.8 3.5 - 5.1  mmol/L   Chloride 100 98 - 111 mmol/L   CO2 29 22 - 32 mmol/L   Glucose, Bld 236 (H) 70 - 99 mg/dL    Comment: Glucose reference range applies only to samples taken after fasting for at least 8 hours.   BUN 12 8 - 23 mg/dL   Creatinine, Ser 0.57 0.44 - 1.00 mg/dL   Calcium 9.0 8.9 - 10.3 mg/dL   GFR, Estimated >60 >60 mL/min    Comment: (NOTE) Calculated using the CKD-EPI Creatinine Equation (2021)    Anion gap 7 5 - 15    Comment: Performed at [facility], [place], [place] [address]  Troponin I (High Sensitivity)     Status: Abnormal   Collection Time: 03/31/21  2:44 PM  Result Value Ref Range   Troponin I (High Sensitivity) 21 (H) <18 ng/L    Comment: (NOTE) Elevated high sensitivity troponin I (hsTnI) values and significant  changes across serial measurements may suggest ACS but many other  chronic and acute conditions are known to elevate hsTnI results.  Refer to the "Links" section for chest pain algorithms and additional  guidance. Performed at [facility], [place], [place] [address]   Troponin I (High Sensitivity)     Status: Abnormal   Collection Time: 03/31/21  8:08 PM  Result Value Ref Range   Troponin I (High Sensitivity) 32 (H) <18 ng/L    Comment: (NOTE) Elevated high sensitivity troponin I (hsTnI) values and significant  changes across serial measurements may suggest ACS but many other  chronic and acute conditions are  known to elevate hsTnI results.  Refer to the "Links" section for chest pain algorithms and additional  guidance. Performed at [facility], [address] [place], [place] [address]   CBG monitoring, ED     Status: None   Collection Time: 03/31/21  8:09 PM  Result Value Ref Range   Glucose-Capillary 98 70 - 99 mg/dL    Comment: Glucose reference range applies only to samples taken after fasting for at least 8 hours.  Resp Panel by RT-PCR (Flu A&B, Covid) Nasopharyngeal Swab     Status: None   Collection Time: 03/31/21  9:38 PM   Specimen: Nasopharyngeal Swab; Nasopharyngeal(NP) swabs in vial transport medium  Result Value Ref Range   SARS Coronavirus 2 by RT PCR NEGATIVE NEGATIVE    Comment: (NOTE) SARS-CoV-2 target nucleic acids are NOT DETECTED.  The SARS-CoV-2 RNA is generally detectable in upper respiratory specimens during the acute phase of infection. The lowest concentration of SARS-CoV-2 viral copies this assay can detect is 138 copies/mL. A negative result does not preclude SARS-Cov-2 infection and should not be used as the sole basis for treatment or other patient management decisions. A negative result may occur with  improper specimen collection/handling, submission of specimen other than nasopharyngeal swab, presence of viral mutation(s) within the areas targeted by this assay, and inadequate number of viral copies(<138 copies/mL). A negative result must be combined with clinical observations, patient history, and epidemiological information. The expected result is Negative.  Fact Sheet for Patients:  EntrepreneurPulse.com.au  Fact Sheet for Healthcare Providers:  IncredibleEmployment.be  This test is no t yet approved or cleared by the [place] FDA and  has been authorized for detection and/or diagnosis of SARS-CoV-2 by FDA under an Emergency Use Authorization (EUA). This EUA will remain  in effect (meaning  this test can be used) for the duration of the COVID-19  declaration under Section 564(b)(1) of the Act, 21 U.S.C.section 360bbb-3(b)(1), unless the authorization is terminated  or revoked sooner.       Influenza A by PCR NEGATIVE NEGATIVE   Influenza B by PCR NEGATIVE NEGATIVE    Comment: (NOTE) The Xpert Xpress SARS-CoV-2/FLU/RSV plus assay is intended as an aid in the diagnosis of influenza from Nasopharyngeal swab specimens and should not be used as a sole basis for treatment. Nasal washings and aspirates are unacceptable for Xpert Xpress SARS-CoV-2/FLU/RSV testing.  Fact Sheet for Patients: EntrepreneurPulse.com.au  Fact Sheet for Healthcare Providers: IncredibleEmployment.be  This test is not yet approved or cleared by the [place] FDA and has been authorized for detection and/or diagnosis of SARS-CoV-2 by FDA under an Emergency Use Authorization (EUA). This EUA will remain in effect (meaning this test can be used) for the duration of the COVID-19 declaration under Section 564(b)(1) of the Act, 21 U.S.C. section 360bbb-3(b)(1), unless the authorization is terminated or revoked.  Performed at [facility], [place], [place] [address]   Troponin I (High Sensitivity)     Status: Abnormal   Collection Time: 03/31/21  9:38 PM  Result Value Ref Range   Troponin I (High Sensitivity) 30 (H) <18 ng/L    Comment: (NOTE) Elevated high sensitivity troponin I (hsTnI) values and significant  changes across serial measurements may suggest ACS but many other  chronic and acute conditions are known to elevate hsTnI results.  Refer to the "Links" section for chest pain algorithms and additional  guidance. Performed at [facility], [address] [place], [place] [address]   D-dimer, quantitative     Status: Abnormal   Collection Time: 03/31/21  9:38 PM  Result Value Ref Range   D-Dimer, Quant 1.63 (H)  0.00 - 0.50 ug/mL-FEU    Comment: (NOTE) At the manufacturer cut-off value of 0.5 g/mL FEU, this assay has a negative predictive value of 95-100%.This assay is intended for use in conjunction with a clinical pretest probability (PTP) assessment model to exclude pulmonary embolism (PE) and deep venous thrombosis (DVT) in outpatients suspected of PE or DVT. Results should be correlated with clinical presentation. Performed at [facility], [address] [place], [place] [address]   Basic metabolic panel     Status: Abnormal   Collection Time: 04/01/21 12:23 AM  Result Value Ref Range   Sodium 135 135 - 145 mmol/L   Potassium 3.5 3.5 - 5.1 mmol/L   Chloride 100 98 - 111 mmol/L   CO2 30 22 - 32 mmol/L   Glucose, Bld 234 (H) 70 - 99 mg/dL    Comment: Glucose reference range applies only to samples taken after fasting for at least 8 hours.   BUN 10 8 - 23 mg/dL   Creatinine, Ser 0.61 0.44 - 1.00 mg/dL   Calcium 8.8 (L) 8.9 - 10.3 mg/dL   GFR, Estimated >60 >60 mL/min    Comment: (NOTE) Calculated using the CKD-EPI Creatinine Equation (2021)    Anion gap 5 5 - 15    Comment: Performed at [facility], [place], [place] [address]  Troponin I (High Sensitivity)     Status: Abnormal   Collection Time: 04/01/21 12:23 AM  Result Value Ref Range   Troponin I (High Sensitivity) 30 (H) <18 ng/L    Comment: (NOTE) Elevated high sensitivity troponin I (hsTnI) values and significant  changes across serial measurements may suggest ACS but many other  chronic and  acute conditions are known to elevate hsTnI results.  Refer to the "Links" section for chest pain algorithms and additional  guidance. Performed at [facility], [place], [place] [address]   Troponin I (High Sensitivity)     Status: Abnormal   Collection Time: 04/01/21  1:39 AM  Result Value Ref Range   Troponin I (High Sensitivity) 24 (H) <18 ng/L    Comment:  (NOTE) Elevated high sensitivity troponin I (hsTnI) values and significant  changes across serial measurements may suggest ACS but many other  chronic and acute conditions are known to elevate hsTnI results.  Refer to the "Links" section for chest pain algorithms and additional  guidance. Performed at [facility], [address] [place], [place] [address]   Glucose, capillary     Status: Abnormal   Collection Time: 04/01/21  8:05 AM  Result Value Ref Range   Glucose-Capillary 412 (H) 70 - 99 mg/dL    Comment: Glucose reference range applies only to samples taken after fasting for at least 8 hours.  Glucose, capillary     Status: Abnormal   Collection Time: 04/01/21 11:44 AM  Result Value Ref Range   Glucose-Capillary 373 (H) 70 - 99 mg/dL    Comment: Glucose reference range applies only to samples taken after fasting for at least 8 hours.  Glucose, capillary     Status: Abnormal   Collection Time: 04/01/21  4:44 PM  Result Value Ref Range   Glucose-Capillary 174 (H) 70 - 99 mg/dL    Comment: Glucose reference range applies only to samples taken after fasting for at least 8 hours.  Glucose, capillary     Status: Abnormal   Collection Time: 04/01/21  8:41 PM  Result Value Ref Range   Glucose-Capillary 291 (H) 70 - 99 mg/dL    Comment: Glucose reference range applies only to samples taken after fasting for at least 8 hours.   Comment 1 Notify RN   Hemoglobin A1c     Status: Abnormal   Collection Time: 04/02/21  3:45 AM  Result Value Ref Range   Hgb A1c MFr Bld 8.2 (H) 4.8 - 5.6 %    Comment: (NOTE) Pre diabetes:          5.7%-6.4%  Diabetes:              >6.4%  Glycemic control for   <7.0% adults with diabetes    Mean Plasma Glucose 188.64 mg/dL    Comment: Performed at [facility] [address] [place], [place] [address]  Glucose, capillary     Status: Abnormal   Collection Time: 04/02/21  8:08 AM  Result Value Ref Range   Glucose-Capillary 184 (H)  70 - 99 mg/dL    Comment: Glucose reference range applies only to samples taken after fasting for at least 8 hours.  Glucose, capillary     Status: Abnormal   Collection Time: 04/02/21 11:07 AM  Result Value Ref Range   Glucose-Capillary 305 (H) 70 - 99 mg/dL    Comment: Glucose reference range applies only to samples taken after fasting for at least 8 hours.  Glucose, capillary     Status: Abnormal   Collection Time: 04/02/21  4:48 PM  Result Value Ref Range   Glucose-Capillary 317 (H) 70 - 99 mg/dL    Comment: Glucose reference range applies only to samples taken after fasting for at least 8 hours.  Glucose, capillary     Status: Abnormal  Collection Time: 04/02/21  9:28 PM  Result Value Ref Range   Glucose-Capillary 324 (H) 70 - 99 mg/dL    Comment: Glucose reference range applies only to samples taken after fasting for at least 8 hours.   Comment 1 Notify RN   Basic metabolic panel     Status: Abnormal   Collection Time: 04/03/21  4:23 AM  Result Value Ref Range   Sodium 135 135 - 145 mmol/L   Potassium 4.4 3.5 - 5.1 mmol/L   Chloride 93 (L) 98 - 111 mmol/L   CO2 31 22 - 32 mmol/L   Glucose, Bld 360 (H) 70 - 99 mg/dL    Comment: Glucose reference range applies only to samples taken after fasting for at least 8 hours.   BUN 20 8 - 23 mg/dL   Creatinine, Ser 0.58 0.44 - 1.00 mg/dL   Calcium 8.7 (L) 8.9 - 10.3 mg/dL   GFR, Estimated >60 >60 mL/min    Comment: (NOTE) Calculated using the CKD-EPI Creatinine Equation (2021)    Anion gap 11 5 - 15    Comment: Performed at [facility], [address] [place], [place] [address]  CBC     Status: Abnormal   Collection Time: 04/03/21  4:23 AM  Result Value Ref Range   WBC 7.8 4.0 - 10.5 K/uL   RBC 3.47 (L) 3.87 - 5.11 MIL/uL   Hemoglobin 10.3 (L) 12.0 - 15.0 g/dL   HCT 32.0 (L) 36.0 - 46.0 %   MCV 92.2 80.0 - 100.0 fL   MCH 29.7 26.0 - 34.0 pg   MCHC 32.2 30.0 - 36.0 g/dL   RDW 13.5 11.5 - 15.5 %   Platelets 261  150 - 400 K/uL   nRBC 0.0 0.0 - 0.2 %    Comment: Performed at [facility], [address] [place], [place] [address]  Magnesium     Status: None   Collection Time: 04/03/21  4:23 AM  Result Value Ref Range   Magnesium 2.3 1.7 - 2.4 mg/dL    Comment: Performed at [facility], [address] [place], [place] [address]  Glucose, capillary     Status: Abnormal   Collection Time: 04/03/21  7:32 AM  Result Value Ref Range   Glucose-Capillary 317 (H) 70 - 99 mg/dL    Comment: Glucose reference range applies only to samples taken after fasting for at least 8 hours.  ECHOCARDIOGRAM COMPLETE     Status: None   Collection Time: 04/03/21  7:56 AM  Result Value Ref Range   Weight 1,668.44 oz   Height 59 in   BP 128/81 mmHg   S' Lateral 2.40 cm  Glucose, capillary     Status: Abnormal   Collection Time: 04/03/21 11:16 AM  Result Value Ref Range   Glucose-Capillary 118 (H) 70 - 99 mg/dL    Comment: Glucose reference range applies only to samples taken after fasting for at least 8 hours.  Glucose, capillary     Status: Abnormal   Collection Time: 04/03/21  4:37 PM  Result Value Ref Range   Glucose-Capillary 519 (HH) 70 - 99 mg/dL    Comment: Glucose reference range applies only to samples taken after fasting for at least 8 hours.   Comment 1 Notify RN   Glucose, capillary     Status: Abnormal   Collection Time: 04/03/21  5:21 PM  Result Value Ref Range   Glucose-Capillary 470 (H) 70 - 99 mg/dL  Comment: Glucose reference range applies only to samples taken after fasting for at least 8 hours.  Glucose, capillary     Status: Abnormal   Collection Time: 04/03/21 10:11 PM  Result Value Ref Range   Glucose-Capillary 248 (H) 70 - 99 mg/dL    Comment: Glucose reference range applies only to samples taken after fasting for at least 8 hours.   Comment 1 Notify RN   Basic metabolic panel     Status: Abnormal   Collection Time: 04/04/21  3:16 AM  Result Value Ref Range    Sodium 135 135 - 145 mmol/L   Potassium 3.7 3.5 - 5.1 mmol/L   Chloride 99 98 - 111 mmol/L   CO2 30 22 - 32 mmol/L   Glucose, Bld 220 (H) 70 - 99 mg/dL    Comment: Glucose reference range applies only to samples taken after fasting for at least 8 hours.   BUN 26 (H) 8 - 23 mg/dL   Creatinine, Ser 0.77 0.44 - 1.00 mg/dL   Calcium 8.3 (L) 8.9 - 10.3 mg/dL   GFR, Estimated >60 >60 mL/min    Comment: (NOTE) Calculated using the CKD-EPI Creatinine Equation (2021)    Anion gap 6 5 - 15    Comment: Performed at [facility], [address] [place], [place] [address]  CBC     Status: Abnormal   Collection Time: 04/04/21  3:16 AM  Result Value Ref Range   WBC 9.0 4.0 - 10.5 K/uL   RBC 3.39 (L) 3.87 - 5.11 MIL/uL   Hemoglobin 9.7 (L) 12.0 - 15.0 g/dL   HCT 30.9 (L) 36.0 - 46.0 %   MCV 91.2 80.0 - 100.0 fL   MCH 28.6 26.0 - 34.0 pg   MCHC 31.4 30.0 - 36.0 g/dL   RDW 13.7 11.5 - 15.5 %   Platelets 261 150 - 400 K/uL   nRBC 0.0 0.0 - 0.2 %    Comment: Performed at [facility], [address] [place], [place] [address]  Magnesium     Status: None   Collection Time: 04/04/21  3:16 AM  Result Value Ref Range   Magnesium 2.3 1.7 - 2.4 mg/dL    Comment: Performed at [facility], [address] [place], [place] [address]  Glucose, capillary     Status: Abnormal   Collection Time: 04/04/21  7:42 AM  Result Value Ref Range   Glucose-Capillary 177 (H) 70 - 99 mg/dL    Comment: Glucose reference range applies only to samples taken after fasting for at least 8 hours.  Glucose, capillary     Status: Abnormal   Collection Time: 04/04/21 12:37 PM  Result Value Ref Range   Glucose-Capillary 351 (H) 70 - 99 mg/dL    Comment: Glucose reference range applies only to samples taken after fasting for at least 8 hours.  Glucose, capillary     Status: Abnormal   Collection Time: 04/04/21  4:30 PM  Result Value Ref Range   Glucose-Capillary 356 (H) 70 - 99 mg/dL     Comment: Glucose reference range applies only to samples taken after fasting for at least 8 hours.  Glucose, capillary     Status: Abnormal   Collection Time: 04/04/21  8:51 PM  Result Value Ref Range   Glucose-Capillary 305 (H) 70 - 99 mg/dL    Comment: Glucose reference range applies only to samples taken after fasting for at least 8 hours.   Comment 1 Notify RN  Basic metabolic panel     Status: Abnormal   Collection Time: 04/05/21  4:30 AM  Result Value Ref Range   Sodium 137 135 - 145 mmol/L   Potassium 3.9 3.5 - 5.1 mmol/L   Chloride 100 98 - 111 mmol/L   CO2 34 (H) 22 - 32 mmol/L   Glucose, Bld 204 (H) 70 - 99 mg/dL    Comment: Glucose reference range applies only to samples taken after fasting for at least 8 hours.   BUN 23 8 - 23 mg/dL   Creatinine, Ser 0.65 0.44 - 1.00 mg/dL   Calcium 8.6 (L) 8.9 - 10.3 mg/dL   GFR, Estimated >60 >60 mL/min    Comment: (NOTE) Calculated using the CKD-EPI Creatinine Equation (2021)    Anion gap 3 (L) 5 - 15    Comment: Performed at [facility], [address] [place], [place] [address]  CBC     Status: Abnormal   Collection Time: 04/05/21  4:30 AM  Result Value Ref Range   WBC 8.8 4.0 - 10.5 K/uL   RBC 3.30 (L) 3.87 - 5.11 MIL/uL   Hemoglobin 9.7 (L) 12.0 - 15.0 g/dL   HCT 30.9 (L) 36.0 - 46.0 %   MCV 93.6 80.0 - 100.0 fL   MCH 29.4 26.0 - 34.0 pg   MCHC 31.4 30.0 - 36.0 g/dL   RDW 13.7 11.5 - 15.5 %   Platelets 259 150 - 400 K/uL   nRBC 0.2 0.0 - 0.2 %    Comment: Performed at [facility], [address] [place],  [address]  Magnesium     Status: None   Collection Time: 04/05/21  4:30 AM  Result Value Ref Range   Magnesium 2.3 1.7 - 2.4 mg/dL    Comment: Performed at [facility], [address] [place],  [address]  Glucose, capillary     Status: Abnormal   Collection Time: 04/05/21  7:43 AM  Result Value Ref Range   Glucose-Capillary 173 (H) 70 - 99 mg/dL    Comment: Glucose  reference range applies only to samples taken after fasting for at least 8 hours.  Glucose, capillary     Status: Abnormal   Collection Time: 04/05/21 12:40 PM  Result Value Ref Range   Glucose-Capillary 291 (H) 70 - 99 mg/dL    Comment: Glucose reference range applies only to samples taken after fasting for at least 8 hours.  CULTURE, URINE COMPREHENSIVE     Status: None   Collection Time: 04/22/21  9:56 AM   Specimen: Urine   Urine  Result Value Ref Range   Urine Culture, Comprehensive Final report    Organism ID, Bacteria Comment     Comment: Mixed urogenital flora 1,000 Colonies/mL   POCT Urinalysis Dipstick     Status: Abnormal   Collection Time: 04/22/21  9:59 AM  Result Value Ref Range   Color, UA     Clarity, UA     Glucose, UA Negative Negative   Bilirubin, UA negative    Ketones, UA negative    Spec Grav, UA 1.010 1.010 - 1.025   Blood, UA neg    pH, UA 6.0 5.0 - 8.0   Protein, UA Positive (A) Negative    Comment: +100   Urobilinogen, UA 0.2 0.2 or 1.0 E.U./dL   Nitrite, UA neg    Leukocytes, UA Negative Negative   Appearance     Odor    Basic metabolic panel  Status: Abnormal   Collection Time: 04/30/21  9:47 PM  Result Value Ref Range   Sodium 134 (L) 135 - 145 mmol/L   Potassium 3.6 3.5 - 5.1 mmol/L    Comment: HEMOLYSIS AT THIS LEVEL MAY AFFECT RESULT   Chloride 100 98 - 111 mmol/L   CO2 27 22 - 32 mmol/L   Glucose, Bld 174 (H) 70 - 99 mg/dL    Comment: Glucose reference range applies only to samples taken after fasting for at least 8 hours.   BUN 19 8 - 23 mg/dL   Creatinine, Ser 0.60 0.44 - 1.00 mg/dL   Calcium 8.6 (L) 8.9 - 10.3 mg/dL   GFR, Estimated >60 >60 mL/min    Comment: (NOTE) Calculated using the CKD-EPI Creatinine Equation (2021)    Anion gap 7 5 - 15    Comment: Performed at [facility], [address] [place], [place] [address]  CBC     Status: Abnormal   Collection Time: 04/30/21  9:47 PM  Result Value Ref Range    WBC 6.2 4.0 - 10.5 K/uL   RBC 3.46 (L) 3.87 - 5.11 MIL/uL   Hemoglobin 10.1 (L) 12.0 - 15.0 g/dL   HCT 31.8 (L) 36.0 - 46.0 %   MCV 91.9 80.0 - 100.0 fL   MCH 29.2 26.0 - 34.0 pg   MCHC 31.8 30.0 - 36.0 g/dL   RDW 13.4 11.5 - 15.5 %   Platelets 273 150 - 400 K/uL   nRBC 0.0 0.0 - 0.2 %    Comment: Performed at [facility], [place], [place] [address]  Troponin I (High Sensitivity)     Status: Abnormal   Collection Time: 04/30/21  9:47 PM  Result Value Ref Range   Troponin I (High Sensitivity) 35 (H) <18 ng/L    Comment: (NOTE) Elevated high sensitivity troponin I (hsTnI) values and significant  changes across serial measurements may suggest ACS but many other  chronic and acute conditions are known to elevate hsTnI results.  Refer to the "Links" section for chest pain algorithms and additional  guidance. Performed at [facility], [address] [place], [place] [address]   Brain natriuretic peptide     Status: None   Collection Time: 04/30/21  9:47 PM  Result Value Ref Range   B Natriuretic Peptide 29.3 0.0 - 100.0 pg/mL    Comment: Performed at [facility], [address] [place], [place] [address]  Resp Panel by RT-PCR (Flu A&B, Covid) Nasopharyngeal Swab     Status: None   Collection Time: 04/30/21 10:24 PM   Specimen: Nasopharyngeal Swab; Nasopharyngeal(NP) swabs in vial transport medium  Result Value Ref Range   SARS Coronavirus 2 by RT PCR NEGATIVE NEGATIVE    Comment: (NOTE) SARS-CoV-2 target nucleic acids are NOT DETECTED.  The SARS-CoV-2 RNA is generally detectable in upper respiratory specimens during the acute phase of infection. The lowest concentration of SARS-CoV-2 viral copies this assay can detect is 138 copies/mL. A negative result does not preclude SARS-Cov-2 infection and should not be used as the sole basis for treatment or other patient management decisions. A negative result may occur with  improper  specimen collection/handling, submission of specimen other than nasopharyngeal swab, presence of viral mutation(s) within the areas targeted by this assay, and inadequate number of viral copies(<138 copies/mL). A negative result must be combined with clinical observations, patient history, and epidemiological information. The expected result is Negative.  Fact Sheet for  Patients:  EntrepreneurPulse.com.au  Fact Sheet for Healthcare Providers:  IncredibleEmployment.be  This test is no t yet approved or cleared by the [place] FDA and  has been authorized for detection and/or diagnosis of SARS-CoV-2 by FDA under an Emergency Use Authorization (EUA). This EUA will remain  in effect (meaning this test can be used) for the duration of the COVID-19 declaration under Section 564(b)(1) of the Act, 21 U.S.C.section 360bbb-3(b)(1), unless the authorization is terminated  or revoked sooner.       Influenza A by PCR NEGATIVE NEGATIVE   Influenza B by PCR NEGATIVE NEGATIVE    Comment: (NOTE) The Xpert Xpress SARS-CoV-2/FLU/RSV plus assay is intended as an aid in the diagnosis of influenza from Nasopharyngeal swab specimens and should not be used as a sole basis for treatment. Nasal washings and aspirates are unacceptable for Xpert Xpress SARS-CoV-2/FLU/RSV testing.  Fact Sheet for Patients: EntrepreneurPulse.com.au  Fact Sheet for Healthcare Providers: IncredibleEmployment.be  This test is not yet approved or cleared by the [place] FDA and has been authorized for detection and/or diagnosis of SARS-CoV-2 by FDA under an Emergency Use Authorization (EUA). This EUA will remain in effect (meaning this test can be used) for the duration of the COVID-19 declaration under Section 564(b)(1) of the Act, 21 U.S.C. section 360bbb-3(b)(1), unless the authorization is terminated or revoked.  Performed at [facility], [address], [place], [place] [address]   Troponin I (High Sensitivity)     Status: Abnormal   Collection Time: 04/30/21 11:41 PM  Result Value Ref Range   Troponin I (High Sensitivity) 43 (H) <18 ng/L    Comment: (NOTE) Elevated high sensitivity troponin I (hsTnI) values and significant  changes across serial measurements may suggest ACS but many other  chronic and acute conditions are known to elevate hsTnI results.  Refer to the "Links" section for chest pain algorithms and additional  guidance. Performed at [facility], [address] [place], [place] [address]   Magnesium     Status: None   Collection Time: 04/30/21 11:41 PM  Result Value Ref Range   Magnesium 2.0 1.7 - 2.4 mg/dL    Comment: Performed at [facility], [address] [place], [place]  [address]  Procalcitonin - Baseline     Status: None   Collection Time: 04/30/21 11:41 PM  Result Value Ref Range   Procalcitonin <0.10 ng/mL    Comment:        Interpretation: PCT (Procalcitonin) <= 0.5 ng/mL: Systemic infection (sepsis) is not likely. Local bacterial infection is possible. (NOTE)       Sepsis PCT Algorithm           Lower Respiratory Tract                                      Infection PCT Algorithm    ----------------------------     ----------------------------         PCT < 0.25 ng/mL                PCT < 0.10 ng/mL          Strongly encourage             Strongly discourage   discontinuation of antibiotics    initiation of antibiotics    ----------------------------     -----------------------------       PCT 0.25 - 0.50 ng/mL  PCT 0.10 - 0.25 ng/mL               OR       >80% decrease in PCT            Discourage initiation of                                            antibiotics      Encourage discontinuation           of antibiotics    ----------------------------     -----------------------------         PCT >= 0.50 ng/mL              PCT 0.26  - 0.50 ng/mL               AND        <80% decrease in PCT             Encourage initiation of                                             antibiotics       Encourage continuation           of antibiotics    ----------------------------     -----------------------------        PCT >= 0.50 ng/mL                  PCT > 0.50 ng/mL               AND         increase in PCT                  Strongly encourage                                      initiation of antibiotics    Strongly encourage escalation           of antibiotics                                     -----------------------------                                           PCT <= 0.25 ng/mL                                                 OR                                        > 80% decrease in PCT  Discontinue / Do not initiate                                             antibiotics  Performed at [facility], [address] [place], [place] [address]   Phosphorus     Status: None   Collection Time: 05/01/21  6:19 AM  Result Value Ref Range   Phosphorus 3.3 2.5 - 4.6 mg/dL    Comment: Performed at [facility], [address] [place], [place] [address]  Magnesium     Status: None   Collection Time: 05/01/21  6:19 AM  Result Value Ref Range   Magnesium 2.0 1.7 - 2.4 mg/dL    Comment: Performed at [facility], [address] [place], [place] [address]  Comprehensive metabolic panel     Status: Abnormal   Collection Time: 05/01/21  6:19 AM  Result Value Ref Range   Sodium 133 (L) 135 - 145 mmol/L   Potassium 4.6 3.5 - 5.1 mmol/L   Chloride 101 98 - 111 mmol/L   CO2 25 22 - 32 mmol/L   Glucose, Bld 347 (H) 70 - 99 mg/dL    Comment: Glucose reference range applies only to samples taken after fasting for at least 8 hours.   BUN 16 8 - 23 mg/dL   Creatinine, Ser 0.58 0.44 - 1.00 mg/dL   Calcium 8.8 (L) 8.9 - 10.3 mg/dL   Total Protein 6.9 6.5 - 8.1  g/dL   Albumin 3.7 3.5 - 5.0 g/dL   AST 30 15 - 41 U/L   ALT 23 0 - 44 U/L   Alkaline Phosphatase 63 38 - 126 U/L   Total Bilirubin 0.5 0.3 - 1.2 mg/dL   GFR, Estimated >60 >60 mL/min    Comment: (NOTE) Calculated using the CKD-EPI Creatinine Equation (2021)    Anion gap 7 5 - 15    Comment: Performed at [facility], [address] [place], [place] [address]  CBC     Status: Abnormal   Collection Time: 05/01/21  6:19 AM  Result Value Ref Range   WBC 8.5 4.0 - 10.5 K/uL   RBC 3.51 (L) 3.87 - 5.11 MIL/uL   Hemoglobin 10.4 (L) 12.0 - 15.0 g/dL   HCT 32.3 (L) 36.0 - 46.0 %   MCV 92.0 80.0 - 100.0 fL   MCH 29.6 26.0 - 34.0 pg   MCHC 32.2 30.0 - 36.0 g/dL   RDW 13.5 11.5 - 15.5 %   Platelets 280 150 - 400 K/uL   nRBC 0.0 0.0 - 0.2 %    Comment: Performed at [facility], [address] [place], [place] [address]  Blood gas, venous     Status: Abnormal (Preliminary result)   Collection Time: 05/01/21  6:19 AM  Result Value Ref Range   FIO2 PENDING    pH, Ven 7.37 7.250 - 7.430   pCO2, Ven 39 (L) 44.0 - 60.0 mmHg   pO2, Ven 131.0 (H) 32.0 - 45.0 mmHg   Bicarbonate 22.5 20.0 - 28.0 mmol/L   Acid-base deficit 2.5 (H) 0.0 - 2.0 mmol/L   O2 Saturation 98.9 %   Patient temperature 37.0    Collection site PENDING    Sample type VEIN     Comment: Performed at [facility], [address] [place], [place] [address]   Mechanical  Rate PENDING   Troponin I (High Sensitivity)     Status: Abnormal   Collection Time: 05/01/21  6:19 AM  Result Value Ref Range   Troponin I (High Sensitivity) 29 (H) <18 ng/L    Comment: (NOTE) Elevated high sensitivity troponin I (hsTnI) values and significant  changes across serial measurements may suggest ACS but many other  chronic and acute conditions are known to elevate hsTnI results.  Refer to the "Links" section for chest pain algorithms and additional  guidance. Performed at [facility], [address] [place], [place] [address]   CBG monitoring, ED     Status: Abnormal   Collection Time: 05/01/21  8:20 AM  Result Value Ref Range   Glucose-Capillary 290 (H) 70 - 99 mg/dL    Comment: Glucose reference range applies only to samples taken after fasting for at least 8 hours.  CBG monitoring, ED     Status: Abnormal   Collection Time: 05/01/21 11:57 AM  Result Value Ref Range   Glucose-Capillary 332 (H) 70 - 99 mg/dL    Comment: Glucose reference range applies only to samples taken after fasting for at least 8 hours.  Glucose, capillary     Status: Abnormal   Collection Time: 05/01/21  4:43 PM  Result Value Ref Range   Glucose-Capillary 251 (H) 70 - 99 mg/dL    Comment: Glucose reference range applies only to samples taken after fasting for at least 8 hours.  Brain natriuretic peptide     Status: None   Collection Time: 05/01/21  8:21 PM  Result Value Ref Range   B Natriuretic Peptide 77.6 0.0 - 100.0 pg/mL    Comment: Performed at [facility], [address] [place], [place] [address]  D-dimer, quantitative     Status: Abnormal   Collection Time: 05/01/21  8:21 PM  Result Value Ref Range   D-Dimer, Quant 2.38 (H) 0.00 - 0.50 ug/mL-FEU    Comment: (NOTE) At the manufacturer cut-off value of 0.5 g/mL FEU, this assay has a negative predictive value of 95-100%.This assay is intended for use in conjunction with a clinical pretest probability (PTP) assessment model to exclude pulmonary embolism (PE) and deep venous thrombosis (DVT) in outpatients suspected of PE or DVT. Results should be correlated with clinical presentation. Performed at [facility], [address] [place], [place] [address]   Glucose, capillary     Status: Abnormal   Collection Time: 05/01/21  9:31 PM  Result Value Ref Range   Glucose-Capillary 285 (H) 70 - 99 mg/dL    Comment: Glucose reference range applies only to samples taken after fasting for at least 8 hours.  Glucose, capillary      Status: Abnormal   Collection Time: 05/02/21  7:23 AM  Result Value Ref Range   Glucose-Capillary 152 (H) 70 - 99 mg/dL    Comment: Glucose reference range applies only to samples taken after fasting for at least 8 hours.  Vitamin B12     Status: None   Collection Time: 05/02/21  7:58 AM  Result Value Ref Range   Vitamin B-12 748 180 - 914 pg/mL    Comment: (NOTE) This assay is not validated for testing neonatal or myeloproliferative syndrome specimens for Vitamin B12 levels. Performed at [facility], [place] [address], [place], [place] [address]   Folate     Status: None   Collection Time: 05/02/21  7:58 AM  Result Value Ref Range   Folate 14.8 >  5.9 ng/mL    Comment: Performed at [facility], [address] [place], [place] [address]  Iron and TIBC     Status: Abnormal   Collection Time: 05/02/21  7:58 AM  Result Value Ref Range   Iron 41 28 - 170 ug/dL   TIBC 395 250 - 450 ug/dL   Saturation Ratios 10 (L) 10.4 - 31.8 %   UIBC 354 ug/dL    Comment: Performed at [facility], [address] [place], [place] [address]  Ferritin     Status: None   Collection Time: 05/02/21  7:58 AM  Result Value Ref Range   Ferritin 21 11 - 307 ng/mL    Comment: Performed at [facility], [address] [place], [place] [address]  Reticulocytes     Status: Abnormal   Collection Time: 05/02/21  7:58 AM  Result Value Ref Range   Retic Ct Pct 2.4 0.4 - 3.1 %   RBC. 3.42 (L) 3.87 - 5.11 MIL/uL   Retic Count, Absolute 82.4 19.0 - 186.0 K/uL   Immature Retic Fract 24.2 (H) 2.3 - 15.9 %    Comment: Performed at [facility], [address] [place], [place] [address]  CBC     Status: Abnormal   Collection Time: 05/02/21  7:58 AM  Result Value Ref Range   WBC 9.7 4.0 - 10.5 K/uL   RBC 3.35 (L) 3.87 - 5.11 MIL/uL   Hemoglobin 9.9 (L) 12.0 - 15.0 g/dL   HCT 31.2 (L) 36.0 - 46.0 %   MCV 93.1 80.0 - 100.0 fL   MCH 29.6 26.0 - 34.0 pg   MCHC 31.7 30.0  - 36.0 g/dL   RDW 13.9 11.5 - 15.5 %   Platelets 272 150 - 400 K/uL   nRBC 0.0 0.0 - 0.2 %    Comment: Performed at [facility], [address] [place], [place] [address]  Basic metabolic panel     Status: Abnormal   Collection Time: 05/02/21  7:58 AM  Result Value Ref Range   Sodium 138 135 - 145 mmol/L   Potassium 4.0 3.5 - 5.1 mmol/L   Chloride 103 98 - 111 mmol/L   CO2 31 22 - 32 mmol/L   Glucose, Bld 151 (H) 70 - 99 mg/dL    Comment: Glucose reference range applies only to samples taken after fasting for at least 8 hours.   BUN 19 8 - 23 mg/dL   Creatinine, Ser 0.45 0.44 - 1.00 mg/dL   Calcium 8.6 (L) 8.9 - 10.3 mg/dL   GFR, Estimated >60 >60 mL/min    Comment: (NOTE) Calculated using the CKD-EPI Creatinine Equation (2021)    Anion gap 4 (L) 5 - 15    Comment: Performed at [facility], [address] [place], [place] [address]  Glucose, capillary     Status: Abnormal   Collection Time: 05/02/21 11:15 AM  Result Value Ref Range   Glucose-Capillary 291 (H) 70 - 99 mg/dL    Comment: Glucose reference range applies only to samples taken after fasting for at least 8 hours.  Glucose, capillary     Status: Abnormal   Collection Time: 05/02/21  4:39 PM  Result Value Ref Range   Glucose-Capillary 294 (H) 70 - 99 mg/dL    Comment: Glucose reference range applies only to samples taken after fasting for at least 8 hours.  Glucose, capillary     Status: Abnormal   Collection Time: 05/02/21 10:44  PM  Result Value Ref Range   Glucose-Capillary 220 (H) 70 - 99 mg/dL    Comment: Glucose reference range applies only to samples taken after fasting for at least 8 hours.  CBC     Status: Abnormal   Collection Time: 05/03/21  4:17 AM  Result Value Ref Range   WBC 10.4 4.0 - 10.5 K/uL   RBC 3.19 (L) 3.87 - 5.11 MIL/uL   Hemoglobin 9.2 (L) 12.0 - 15.0 g/dL   HCT 29.6 (L) 36.0 - 46.0 %   MCV 92.8 80.0 - 100.0 fL   MCH 28.8 26.0 - 34.0 pg   MCHC 31.1 30.0 - 36.0 g/dL    RDW 13.8 11.5 - 15.5 %   Platelets 263 150 - 400 K/uL   nRBC 0.0 0.0 - 0.2 %    Comment: Performed at [facility], [address], [place], [place] [address]  Basic metabolic panel     Status: Abnormal   Collection Time: 05/03/21  4:17 AM  Result Value Ref Range   Sodium 138 135 - 145 mmol/L   Potassium 4.1 3.5 - 5.1 mmol/L   Chloride 103 98 - 111 mmol/L   CO2 32 22 - 32 mmol/L   Glucose, Bld 154 (H) 70 - 99 mg/dL    Comment: Glucose reference range applies only to samples taken after fasting for at least 8 hours.   BUN 23 8 - 23 mg/dL   Creatinine, Ser 0.50 0.44 - 1.00 mg/dL   Calcium 8.4 (L) 8.9 - 10.3 mg/dL   GFR, Estimated >60 >60 mL/min    Comment: (NOTE) Calculated using the CKD-EPI Creatinine Equation (2021)    Anion gap 3 (L) 5 - 15    Comment: Performed at [facility], [address] [place], [place] [address]  Glucose, capillary     Status: Abnormal   Collection Time: 05/03/21  8:02 AM  Result Value Ref Range   Glucose-Capillary 100 (H) 70 - 99 mg/dL    Comment: Glucose reference range applies only to samples taken after fasting for at least 8 hours.  Glucose, capillary     Status: Abnormal   Collection Time: 05/03/21 11:16 AM  Result Value Ref Range   Glucose-Capillary 235 (H) 70 - 99 mg/dL    Comment: Glucose reference range applies only to samples taken after fasting for at least 8 hours.  Glucose, capillary     Status: Abnormal   Collection Time: 05/03/21  4:26 PM  Result Value Ref Range   Glucose-Capillary 338 (H) 70 - 99 mg/dL    Comment: Glucose reference range applies only to samples taken after fasting for at least 8 hours.  Glucose, capillary     Status: Abnormal   Collection Time: 05/03/21  9:00 PM  Result Value Ref Range   Glucose-Capillary 237 (H) 70 - 99 mg/dL    Comment: Glucose reference range applies only to samples taken after fasting for at least 8 hours.  Glucose, capillary     Status: None   Collection Time: 05/04/21   7:47 AM  Result Value Ref Range   Glucose-Capillary 98 70 - 99 mg/dL    Comment: Glucose reference range applies only to samples taken after fasting for at least 8 hours.  Glucose, capillary     Status: Abnormal   Collection Time: 05/04/21 11:45 AM  Result Value Ref Range   Glucose-Capillary 253 (H) 70 - 99 mg/dL    Comment: Glucose reference range applies only to samples  taken after fasting for at least 8 hours.  Glucose, capillary     Status: Abnormal   Collection Time: 05/04/21  4:18 PM  Result Value Ref Range   Glucose-Capillary 339 (H) 70 - 99 mg/dL    Comment: Glucose reference range applies only to samples taken after fasting for at least 8 hours.  Glucose, capillary     Status: Abnormal   Collection Time: 05/04/21  9:07 PM  Result Value Ref Range   Glucose-Capillary 293 (H) 70 - 99 mg/dL    Comment: Glucose reference range applies only to samples taken after fasting for at least 8 hours.  Glucose, capillary     Status: Abnormal   Collection Time: 05/05/21  7:57 AM  Result Value Ref Range   Glucose-Capillary 135 (H) 70 - 99 mg/dL    Comment: Glucose reference range applies only to samples taken after fasting for at least 8 hours.  Glucose, capillary     Status: Abnormal   Collection Time: 05/05/21 11:57 AM  Result Value Ref Range   Glucose-Capillary 290 (H) 70 - 99 mg/dL    Comment: Glucose reference range applies only to samples taken after fasting for at least 8 hours.  Glucose, capillary     Status: Abnormal   Collection Time: 05/05/21  4:26 PM  Result Value Ref Range   Glucose-Capillary 412 (H) 70 - 99 mg/dL    Comment: Glucose reference range applies only to samples taken after fasting for at least 8 hours.  Glucose, capillary     Status: Abnormal   Collection Time: 05/05/21  8:26 PM  Result Value Ref Range   Glucose-Capillary 248 (H) 70 - 99 mg/dL    Comment: Glucose reference range applies only to samples taken after fasting for at least 8 hours.  Glucose,  capillary     Status: Abnormal   Collection Time: 05/06/21  7:31 AM  Result Value Ref Range   Glucose-Capillary 147 (H) 70 - 99 mg/dL    Comment: Glucose reference range applies only to samples taken after fasting for at least 8 hours.  Glucose, capillary     Status: Abnormal   Collection Time: 05/06/21  1:05 PM  Result Value Ref Range   Glucose-Capillary 253 (H) 70 - 99 mg/dL    Comment: Glucose reference range applies only to samples taken after fasting for at least 8 hours.  Glucose, capillary     Status: Abnormal   Collection Time: 05/06/21  3:33 PM  Result Value Ref Range   Glucose-Capillary 188 (H) 70 - 99 mg/dL    Comment: Glucose reference range applies only to samples taken after fasting for at least 8 hours.  Glucose, capillary     Status: Abnormal   Collection Time: 05/06/21  8:52 PM  Result Value Ref Range   Glucose-Capillary 176 (H) 70 - 99 mg/dL    Comment: Glucose reference range applies only to samples taken after fasting for at least 8 hours.  CBC     Status: Abnormal   Collection Time: 05/07/21  3:33 AM  Result Value Ref Range   WBC 9.1 4.0 - 10.5 K/uL   RBC 3.05 (L) 3.87 - 5.11 MIL/uL   Hemoglobin 8.8 (L) 12.0 - 15.0 g/dL   HCT 27.6 (L) 36.0 - 46.0 %   MCV 90.5 80.0 - 100.0 fL   MCH 28.9 26.0 - 34.0 pg   MCHC 31.9 30.0 - 36.0 g/dL   RDW 13.9 11.5 - 15.5 %   Platelets 264  150 - 400 K/uL   nRBC 0.0 0.0 - 0.2 %    Comment: Performed at [facility], [address] [place], [place] [address]  Basic metabolic panel     Status: Abnormal   Collection Time: 05/07/21  3:33 AM  Result Value Ref Range   Sodium 136 135 - 145 mmol/L   Potassium 3.9 3.5 - 5.1 mmol/L   Chloride 100 98 - 111 mmol/L   CO2 33 (H) 22 - 32 mmol/L   Glucose, Bld 351 (H) 70 - 99 mg/dL    Comment: Glucose reference range applies only to samples taken after fasting for at least 8 hours.   BUN 24 (H) 8 - 23 mg/dL   Creatinine, Ser 0.49 0.44 - 1.00 mg/dL   Calcium 7.9 (L) 8.9 - 10.3  mg/dL   GFR, Estimated >60 >60 mL/min    Comment: (NOTE) Calculated using the CKD-EPI Creatinine Equation (2021)    Anion gap 3 (L) 5 - 15    Comment: Performed at [facility], [address] [place], [place] [address]  Glucose, capillary     Status: Abnormal   Collection Time: 05/07/21  7:33 AM  Result Value Ref Range   Glucose-Capillary 199 (H) 70 - 99 mg/dL    Comment: Glucose reference range applies only to samples taken after fasting for at least 8 hours.  Glucose, capillary     Status: Abnormal   Collection Time: 05/07/21 10:26 AM  Result Value Ref Range   Glucose-Capillary 248 (H) 70 - 99 mg/dL    Comment: Glucose reference range applies only to samples taken after fasting for at least 8 hours.  Glucose, capillary     Status: Abnormal   Collection Time: 05/07/21  1:13 PM  Result Value Ref Range   Glucose-Capillary 234 (H) 70 - 99 mg/dL    Comment: Glucose reference range applies only to samples taken after fasting for at least 8 hours.  Glucose, capillary     Status: Abnormal   Collection Time: 05/07/21  2:57 PM  Result Value Ref Range   Glucose-Capillary 112 (H) 70 - 99 mg/dL    Comment: Glucose reference range applies only to samples taken after fasting for at least 8 hours.  Glucose, capillary     Status: Abnormal   Collection Time: 05/07/21  4:01 PM  Result Value Ref Range   Glucose-Capillary 116 (H) 70 - 99 mg/dL    Comment: Glucose reference range applies only to samples taken after fasting for at least 8 hours.  Glucose, capillary     Status: Abnormal   Collection Time: 05/07/21  9:05 PM  Result Value Ref Range   Glucose-Capillary 144 (H) 70 - 99 mg/dL    Comment: Glucose reference range applies only to samples taken after fasting for at least 8 hours.  CBC     Status: Abnormal   Collection Time: 05/08/21  4:19 AM  Result Value Ref Range   WBC 6.8 4.0 - 10.5 K/uL   RBC 3.02 (L) 3.87 - 5.11 MIL/uL   Hemoglobin 8.9 (L) 12.0 - 15.0 g/dL   HCT 28.3 (L)  36.0 - 46.0 %   MCV 93.7 80.0 - 100.0 fL   MCH 29.5 26.0 - 34.0 pg   MCHC 31.4 30.0 - 36.0 g/dL   RDW 14.0 11.5 - 15.5 %   Platelets 261 150 - 400 K/uL   nRBC 0.0 0.0 - 0.2 %    Comment: Performed at [facility], [address] [place], [place] [address]  Basic metabolic panel     Status: Abnormal   Collection Time: 05/08/21  4:19 AM  Result Value Ref Range   Sodium 140 135 - 145 mmol/L   Potassium 3.3 (L) 3.5 - 5.1 mmol/L   Chloride 100 98 - 111 mmol/L   CO2 34 (H) 22 - 32 mmol/L   Glucose, Bld 96 70 - 99 mg/dL    Comment: Glucose reference range applies only to samples taken after fasting for at least 8 hours.   BUN 19 8 - 23 mg/dL   Creatinine, Ser 0.48 0.44 - 1.00 mg/dL   Calcium 8.2 (L) 8.9 - 10.3 mg/dL   GFR, Estimated >60 >60 mL/min    Comment: (NOTE) Calculated using the CKD-EPI Creatinine Equation (2021)    Anion gap 6 5 - 15    Comment: Performed at [facility], [address] [place],  [address]  Glucose, capillary     Status: None   Collection Time: 05/08/21  7:40 AM  Result Value Ref Range   Glucose-Capillary 76 70 - 99 mg/dL    Comment: Glucose reference range applies only to samples taken after fasting for at least 8 hours.  Glucose, capillary     Status: Abnormal   Collection Time: 05/08/21 12:06 PM  Result Value Ref Range   Glucose-Capillary 357 (H) 70 - 99 mg/dL    Comment: Glucose reference range applies only to samples taken after fasting for at least 8 hours.    Radiology DG Chest 2 View  Result Date: 04/30/2021 CLINICAL DATA:  Shortness of breath. EXAM: CHEST - 2 VIEW COMPARISON:  March 31, 2021 FINDINGS: Emphysematous lung disease is seen involving the bilateral upper lobes. Stable, mild to moderate severity linear scarring and/or atelectasis is seen within the right lung base. A mild amount of superimposed right basilar infiltrate is noted. This represents a new finding. There is no evidence of a pleural effusion or  pneumothorax. The heart size and mediastinal contours are within normal limits. The visualized skeletal structures are unremarkable. IMPRESSION: 1. Stable, mild to moderate severity right basilar linear scarring and/or atelectasis. 2. Mild superimposed right basilar infiltrate. Electronically Signed   By: Virgina Norfolk M.D.   On: 04/30/2021 21:47   CT Angio Chest Pulmonary Embolism (PE) W or WO Contrast  Result Date: 05/02/2021 CLINICAL DATA:  Shortness of breath EXAM: CT ANGIOGRAPHY CHEST WITH CONTRAST TECHNIQUE: Multidetector CT imaging of the chest was performed using the standard protocol during bolus administration of intravenous contrast. Multiplanar CT image reconstructions and MIPs were obtained to evaluate the vascular anatomy. CONTRAST:  82m OMNIPAQUE IOHEXOL 350 MG/ML SOLN COMPARISON:  CT chest dated 03/31/2021 FINDINGS: Cardiovascular: Satisfactory opacification the bilateral pulmonary arteries to the lobar level. However, evaluation is constrained by respiratory motion, particularly in the lower lobes. Within that constraint, there is no convincing evidence of pulmonary embolism. Suspected artifact within a segmental right lower lobe pulmonary artery (series 8/image 167) in an area of motion degradation. Although not tailored for evaluation of the thoracic aorta, there is no evidence of thoracic aortic aneurysm or dissection. Atherosclerotic calcifications of the aortic arch. Heart is normal in size.  No pericardial effusion. Mediastinum/Nodes: No suspicious mediastinal lymphadenopathy. Visualized thyroid is unremarkable. Lungs/Pleura: Scarring in the right lung apex (series 9/image 16). Platelike scarring in the inferior right middle lobe (series 9/image 46). Scarring/radiation changes in the right lower lobe (series 9/image 60). These findings are unchanged from the prior. Moderate centrilobular  and paraseptal emphysematous changes, upper lung predominant. No suspicious pulmonary nodules.  Mild bibasilar atelectasis. No pleural effusion or pneumothorax. Upper Abdomen: Visualized upper abdomen is grossly unremarkable. Musculoskeletal: Post treatment changes involving the right lateral 7th rib (series 7/image 48), unchanged. Mild degenerative changes of the lower thoracic spine. Review of the MIP images confirms the above findings. IMPRESSION: No evidence of central pulmonary embolism, noting motion degradation. Scarring/radiation changes in the right hemithorax, unchanged. Aortic Atherosclerosis (ICD10-I70.0) and Emphysema (ICD10-J43.9). Electronically Signed   By: Julian Hy M.D.   On: 05/02/2021 00:13   DG Chest Port 1 View  Result Date: 05/06/2021 CLINICAL DATA:  Dyspnea. EXAM: PORTABLE CHEST 1 VIEW COMPARISON:  May 01, 2021. FINDINGS: The heart size and mediastinal contours are within normal limits. Left lung is clear. Slightly decreased right basilar opacity is noted suggesting improving atelectasis or infiltrate. The visualized skeletal structures are unremarkable. IMPRESSION: Slightly decreased right basilar opacity as described above. Electronically Signed   By: Marijo Conception M.D.   On: 05/06/2021 14:40   DG Chest Port 1 View  Result Date: 05/01/2021 CLINICAL DATA:  Hypoxia EXAM: PORTABLE CHEST 1 VIEW COMPARISON:  04/30/2021 FINDINGS: Right lower lung scarring. Subpleural reticulation/fibrosis at the lung bases. No pleural effusion or pneumothorax. The heart is normal in size. IMPRESSION: Bilateral lower lung scarring. No evidence of acute cardiopulmonary disease. Electronically Signed   By: Julian Hy M.D.   On: 05/01/2021 21:15    Assessment/Plan Essential hypertension blood pressure control important in reducing the progression of atherosclerotic disease. On appropriate oral medications.     Diabetes ([place]) blood glucose control important in reducing the progression of atherosclerotic disease. Also, involved in wound healing. On appropriate medications.      Hyperlipidemia lipid control important in reducing the progression of atherosclerotic disease. Continue statin therapy  Atherosclerosis of native arteries of extremity with intermittent claudication (HCC) Her ABIs today are slightly reduced at 0.84 on the right and 0.70 on the left but overall her digit pressures are roughly stable at 112 on the right and 69 on the left.  I think her lower extremity symptoms are largely neuropathy which we are unlikely to improve with revascularization.  I have discussed with her the differences between peripheral arterial disease with claudication and neuropathy.  I will plan to see her back in 6 months with noninvasive studies.    [name], MD  05/27/2021 2:27 PM    This note was created with Dragon medical transcription system.  Any errors from dictation are purely unintentional

## 2021-05-27 NOTE — Assessment & Plan Note (Signed)
Her ABIs today are slightly reduced at 0.84 on the right and 0.70 on the left but overall her digit pressures are roughly stable at 112 on the right and 69 on the left.  I think her lower extremity symptoms are largely neuropathy which we are unlikely to improve with revascularization.  I have discussed with her the differences between peripheral arterial disease with claudication and neuropathy.  I will plan to see her back in 6 months with noninvasive studies.

## 2021-06-01 NOTE — Patient Instructions (Signed)
[facility] [address] [place], [id number]    Complete Pulmonary Function Testing Interpretation:  FINDINGS:  The forced vital capacity is moderately decreased.  FEV1 was 0.53 L which is 31% of predicted and is severely decreased.  FEV1 FVC ratio is severely decreased.  Postbronchodilator no significant change in FEV1.  Total lung capacity is increased residual volume is increased residual volumes lung capacity ratio is increased FRC is increased.  DLCO was severely decreased.  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

## 2021-06-03 ENCOUNTER — Other Ambulatory Visit: Payer: Self-pay

## 2021-06-03 ENCOUNTER — Ambulatory Visit (INDEPENDENT_AMBULATORY_CARE_PROVIDER_SITE_OTHER): Payer: Medicare Other | Admitting: Internal Medicine

## 2021-06-03 ENCOUNTER — Encounter: Payer: Self-pay | Admitting: Internal Medicine

## 2021-06-03 ENCOUNTER — Telehealth: Payer: Self-pay

## 2021-06-03 VITALS — BP 118/80 | HR 98 | Temp 98.2°F | Resp 16 | Ht 58.5 in | Wt 118.2 lb

## 2021-06-03 DIAGNOSIS — R0602 Shortness of breath: Secondary | ICD-10-CM | POA: Diagnosis not present

## 2021-06-03 DIAGNOSIS — J449 Chronic obstructive pulmonary disease, unspecified: Secondary | ICD-10-CM

## 2021-06-03 DIAGNOSIS — J181 Lobar pneumonia, unspecified organism: Secondary | ICD-10-CM

## 2021-06-03 NOTE — Progress Notes (Signed)
[facility] [place], [place] [address]  Pulmonary Sleep Medicine   Office Visit Note  Patient Name: Angela Jensen DOB: 04/01/1956 MRN [id number]  Date of Service: 06/03/2021  Complaints/HPI: Recently admitted to the hospital for pneumonia COPD. Patient was in for 7 days, She states cough congestion and increased SOB. Since discharge she is doing better. She is on oxygen and states that she is on it continuously. She also has CHF now compensated. She is taking trelegy for her COPD in addition has not been on daliresp. NO chest pain or palpitations noted  ROS  General: (-) fever, (-) chills, (-) night sweats, (-) weakness Skin: (-) rashes, (-) itching,. Eyes: (-) visual changes, (-) redness, (-) itching. Nose and Sinuses: (-) nasal stuffiness or itchiness, (-) postnasal drip, (-) nosebleeds, (-) sinus trouble. Mouth and Throat: (-) sore throat, (-) hoarseness. Neck: (-) swollen glands, (-) enlarged thyroid, (-) neck pain. Respiratory: + cough, (-) bloody sputum, + shortness of breath, + wheezing. Cardiovascular: - ankle swelling, (-) chest pain. Lymphatic: (-) lymph node enlargement. Neurologic: (-) numbness, (-) tingling. Psychiatric: (-) anxiety, (-) depression   Current Medication: Outpatient Encounter Medications as of 06/03/2021  Medication Sig   Accu-Chek Softclix Lancets lancets Use as instructed for twice a daily Dx E11.65   albuterol (PROVENTIL) (2.5 MG/3ML) 0.083% nebulizer solution USE 1 VIAL IN NEBULIZER EVERY 6 HOURS AS NEEDED FOR WHEEZING   albuterol (VENTOLIN HFA) 108 (90 Base) MCG/ACT inhaler Inhale 2 puffs into the lungs every 6 (six) hours as needed for wheezing or shortness of breath.   ALPRAZolam (XANAX) 0.25 MG tablet Take 1 tablet (0.25 mg total) by mouth 3 (three) times daily as needed for anxiety or sleep.   Blood Glucose Monitoring Suppl (ACCU-CHEK GUIDE) w/Device KIT Use as directed Dx e11.65   carvedilol (COREG) 3.125 MG tablet  Take 1 tablet (3.125 mg total) by mouth 2 (two) times daily with a meal.   clopidogrel (PLAVIX) 75 MG tablet Take 1 tablet (75 mg total) by mouth daily.   escitalopram (LEXAPRO) 10 MG tablet Take 1 tablet (10 mg total) by mouth daily.   ferrous sulfate 27 MG TABS Take 12 tablets (324 mg total) by mouth 2 (two) times daily with a meal.   fluticasone (FLONASE) 50 MCG/ACT nasal spray Place 2 sprays into both nostrils daily.   Fluticasone-Umeclidin-Vilant (TRELEGY ELLIPTA) 100-62.5-25 MCG/INH AEPB Inhale 1 puff into the lungs daily.   gabapentin (NEURONTIN) 100 MG capsule Take 1 capsule (100 mg total) by mouth 2 (two) times daily.   glucose blood (ACCU-CHEK GUIDE) test strip Use as instructed twice a daily DX E11.65   insulin detemir (LEVEMIR FLEXTOUCH) 100 UNIT/ML FlexPen Inject 8- 12 units with supper qd// with needles   lisinopril (ZESTRIL) 10 MG tablet Take 1 tablet (10 mg total) by mouth daily.   meloxicam (MOBIC) 7.5 MG tablet Take 1 tablet (7.5 mg total) by mouth 2 (two) times daily as needed for pain. Home med.   metFORMIN (GLUCOPHAGE) 500 MG tablet Take 1 tablet (500 mg total) by mouth daily with breakfast.   Omega-3 Fatty Acids (FISH OIL) 1000 MG CAPS Take 1 capsule by mouth daily.   OXYGEN Inhale into the lungs.   potassium chloride (KLOR-CON) 10 MEQ tablet TAKE 1 TABLET BY MOUTH EVERY OTHER DAY   No facility-administered encounter medications on file as of 06/03/2021.    Surgical History: Past Surgical History:  Procedure Laterality Date   CESAREAN SECTION  x3   COLONOSCOPY WITH PROPOFOL N/A 06/25/2015   Procedure: COLONOSCOPY WITH PROPOFOL;  Surgeon: Lucilla Lame, MD;  Location: [facility];  Service: Endoscopy;  Laterality: N/A;   COLONOSCOPY WITH PROPOFOL N/A 07/26/2020   Procedure: COLONOSCOPY WITH PROPOFOL;  Surgeon: Lucilla Lame, MD;  Location: [facility];  Service: Endoscopy;  Laterality: N/A;   CYST REMOVAL LEG     and on shoulder     ESOPHAGOGASTRODUODENOSCOPY (EGD) WITH PROPOFOL N/A 07/26/2020   Procedure: ESOPHAGOGASTRODUODENOSCOPY (EGD) WITH PROPOFOL;  Surgeon: Lucilla Lame, MD;  Location: [facility];  Service: Endoscopy;  Laterality: N/A;  Diabetic - oral meds   LOWER EXTREMITY ANGIOGRAPHY Left 09/29/2018   Procedure: LOWER EXTREMITY ANGIOGRAPHY;  Surgeon: Algernon Huxley, MD;  Location: [facility];  Service: Cardiovascular;  Laterality: Left;   LUNG BIOPSY  05 15 2013   has lung "spots"   PERIPHERAL VASCULAR CATHETERIZATION Left 06/01/2016   Procedure: Lower Extremity Angiography;  Surgeon: Algernon Huxley, MD;  Location: [facility];  Service: Cardiovascular;  Laterality: Left;   PERIPHERAL VASCULAR CATHETERIZATION N/A 06/01/2016   Procedure: Abdominal Aortogram w/Lower Extremity;  Surgeon: Algernon Huxley, MD;  Location: [facility];  Service: Cardiovascular;  Laterality: N/A;   PERIPHERAL VASCULAR CATHETERIZATION  06/01/2016   Procedure: Lower Extremity Intervention;  Surgeon: Algernon Huxley, MD;  Location: [facility];  Service: Cardiovascular;;   PERIPHERAL VASCULAR CATHETERIZATION Right 06/08/2016   Procedure: Lower Extremity Angiography;  Surgeon: Algernon Huxley, MD;  Location: [facility];  Service: Cardiovascular;  Laterality: Right;   PERIPHERAL VASCULAR CATHETERIZATION  06/08/2016   Procedure: Lower Extremity Intervention;  Surgeon: Algernon Huxley, MD;  Location: [facility];  Service: Cardiovascular;;   SUBMANDIBULAR GLAND EXCISION Left 12/06/2020   Procedure: EXCISION SUBMANDIBULAR GLAND;  Surgeon: Beverly Gust, MD;  Location: [facility];  Service: ENT;  Laterality: Left;  needs to be first case Diabetic - diet controlled    Medical History: Past Medical History:  Diagnosis Date   Anemia    Arthritis    Asthma    Atherosclerosis of native arteries of extremity with intermittent claudication ([place]) 05/26/2016   Cancer ([place]) 2012   Right Lung CA    COPD (chronic obstructive pulmonary disease) ([place])    Depression    Diabetes mellitus without complication ([place])    Patient takes Janumet   Essential hypertension 05/26/2016   Hypercholesteremia    Oxygen dependent    2L at nite    PAD (peripheral artery disease) ([place]) 06/22/2016   Peripheral vascular disease ([place])    Personal history of radiation therapy    Shortness of breath dyspnea    with exertion    Sialolithiasis    Sleep apnea    Wears dentures    full upper and lower    Family History: Family History  Problem Relation Age of Onset   Lung cancer Father    Diabetes Mother    Hypercholesterolemia Mother    Diabetes Sister    Diabetes Maternal Grandmother    Diabetes Paternal Grandmother    Diabetes Sister    Heart attack Brother    Coronary artery disease Brother    Vascular Disease Brother    Hypertension Sister    Heart attack Brother     Social History: Social History   Socioeconomic History   Marital status: Widowed    Spouse name: Not on file   Number of children: Not  on file   Years of education: Not on file   Highest education level: Not on file  Occupational History   Not on file  Tobacco Use   Smoking status: Former    Packs/day: 1.00    Years: 37.00    Pack years: 37.00    Types: Cigarettes    Quit date: 02/06/2010    Years since quitting: 11.3   Smokeless tobacco: Former    Types: Snuff  Vaping Use   Vaping Use: Never used  Substance and Sexual Activity   Alcohol use: Not Currently    Alcohol/week: 5.0 standard drinks    Types: 5 Cans of beer per week    Comment: /h x of alcohol abuse -stopped 2012- now drinks 5 beer per week   Drug use: Not Currently    Types: Marijuana, "Crack" cocaine, Cocaine    Comment: hx of cocaine use- last use 2015; last use marijuana6/22/19,   Sexual activity: Yes  Other Topics Concern   Not on file  Social History Narrative   Lives with Significant Other x 43 years   Social Determinants of Health    Financial Resource Strain: Not on file  Food Insecurity: Not on file  Transportation Needs: Not on file  Physical Activity: Not on file  Stress: Not on file  Social Connections: Not on file  Intimate Partner Violence: Not on file    Vital Signs: Blood pressure 118/80, pulse 98, temperature 98.2 F (36.8 C), resp. rate 16, height 4' 10.5" (1.486 m), weight 118 lb 3.2 oz (53.6 kg), SpO2 95 %.  Examination: General Appearance: The patient is well-developed, well-nourished, and in no distress. Skin: Gross inspection of skin unremarkable. Head: normocephalic, no gross deformities. Eyes: no gross deformities noted. ENT: ears appear grossly normal no exudates. Neck: Supple. No thyromegaly. No LAD. Respiratory: few rhonchi are noted. Cardiovascular: Normal S1 and S2 without murmur or rub. Extremities: No cyanosis. pulses are equal. Neurologic: Alert and oriented. No involuntary movements.  LABS: Recent Results (from the past 2160 hour(s))  CBC with Differential/Platelet     Status: Abnormal   Collection Time: 03/20/21  9:33 AM  Result Value Ref Range   WBC 4.7 4.0 - 10.5 K/uL   RBC 4.08 3.87 - 5.11 MIL/uL   Hemoglobin 11.8 (L) 12.0 - 15.0 g/dL   HCT 36.4 36.0 - 46.0 %   MCV 89.2 80.0 - 100.0 fL   MCH 28.9 26.0 - 34.0 pg   MCHC 32.4 30.0 - 36.0 g/dL   RDW 13.8 11.5 - 15.5 %   Platelets 295 150 - 400 K/uL   nRBC 0.0 0.0 - 0.2 %   Neutrophils Relative % 60 %   Neutro Abs 2.8 1.7 - 7.7 K/uL   Lymphocytes Relative 24 %   Lymphs Abs 1.1 0.7 - 4.0 K/uL   Monocytes Relative 12 %   Monocytes Absolute 0.6 0.1 - 1.0 K/uL   Eosinophils Relative 4 %   Eosinophils Absolute 0.2 0.0 - 0.5 K/uL   Basophils Relative 0 %   Basophils Absolute 0.0 0.0 - 0.1 K/uL   Immature Granulocytes 0 %   Abs Immature Granulocytes 0.01 0.00 - 0.07 K/uL    Comment: Performed at [facility], [address] [place], [place] [address]  Comprehensive metabolic panel     Status: Abnormal    Collection Time: 03/20/21  9:33 AM  Result Value Ref Range   Sodium 134 (L) 135 - 145 mmol/L   Potassium 3.5 3.5 -  5.1 mmol/L   Chloride 97 (L) 98 - 111 mmol/L   CO2 28 22 - 32 mmol/L   Glucose, Bld 242 (H) 70 - 99 mg/dL    Comment: Glucose reference range applies only to samples taken after fasting for at least 8 hours.   BUN 15 8 - 23 mg/dL   Creatinine, Ser 0.61 0.44 - 1.00 mg/dL   Calcium 9.1 8.9 - 10.3 mg/dL   Total Protein 7.9 6.5 - 8.1 g/dL   Albumin 4.2 3.5 - 5.0 g/dL   AST 44 (H) 15 - 41 U/L   ALT 53 (H) 0 - 44 U/L   Alkaline Phosphatase 73 38 - 126 U/L   Total Bilirubin 0.5 0.3 - 1.2 mg/dL   GFR, Estimated >60 >60 mL/min    Comment: (NOTE) Calculated using the CKD-EPI Creatinine Equation (2021)    Anion gap 9 5 - 15    Comment: Performed at [facility], [address], [place], [place] [address]  Ferritin     Status: None   Collection Time: 03/20/21  9:33 AM  Result Value Ref Range   Ferritin 29 11 - 307 ng/mL    Comment: Performed at [facility], [address] [place], [place] [address]  CBC     Status: Abnormal   Collection Time: 03/31/21  2:44 PM  Result Value Ref Range   WBC 4.5 4.0 - 10.5 K/uL   RBC 3.56 (L) 3.87 - 5.11 MIL/uL   Hemoglobin 10.6 (L) 12.0 - 15.0 g/dL   HCT 33.5 (L) 36.0 - 46.0 %   MCV 94.1 80.0 - 100.0 fL   MCH 29.8 26.0 - 34.0 pg   MCHC 31.6 30.0 - 36.0 g/dL   RDW 13.6 11.5 - 15.5 %   Platelets 239 150 - 400 K/uL   nRBC 0.0 0.0 - 0.2 %    Comment: Performed at [facility], [address] [place], [place] [address]  Basic metabolic panel     Status: Abnormal   Collection Time: 03/31/21  2:44 PM  Result Value Ref Range   Sodium 136 135 - 145 mmol/L   Potassium 3.8 3.5 - 5.1 mmol/L   Chloride 100 98 - 111 mmol/L   CO2 29 22 - 32 mmol/L   Glucose, Bld 236 (H) 70 - 99 mg/dL    Comment: Glucose reference range applies only to samples taken after fasting for at least 8 hours.   BUN 12 8 - 23 mg/dL    Creatinine, Ser 0.57 0.44 - 1.00 mg/dL   Calcium 9.0 8.9 - 10.3 mg/dL   GFR, Estimated >60 >60 mL/min    Comment: (NOTE) Calculated using the CKD-EPI Creatinine Equation (2021)    Anion gap 7 5 - 15    Comment: Performed at [facility], [place], [place] [address]  Troponin I (High Sensitivity)     Status: Abnormal   Collection Time: 03/31/21  2:44 PM  Result Value Ref Range   Troponin I (High Sensitivity) 21 (H) <18 ng/L    Comment: (NOTE) Elevated high sensitivity troponin I (hsTnI) values and significant  changes across serial measurements may suggest ACS but many other  chronic and acute conditions are known to elevate hsTnI results.  Refer to the "Links" section for chest pain algorithms and additional  guidance. Performed at [facility], [place], [place] [address]   Troponin I (High Sensitivity)     Status: Abnormal  Collection Time: 03/31/21  8:08 PM  Result Value Ref Range   Troponin I (High Sensitivity) 32 (H) <18 ng/L    Comment: (NOTE) Elevated high sensitivity troponin I (hsTnI) values and significant  changes across serial measurements may suggest ACS but many other  chronic and acute conditions are known to elevate hsTnI results.  Refer to the "Links" section for chest pain algorithms and additional  guidance. Performed at [facility], [address] [place], [place] [address]   CBG monitoring, ED     Status: None   Collection Time: 03/31/21  8:09 PM  Result Value Ref Range   Glucose-Capillary 98 70 - 99 mg/dL    Comment: Glucose reference range applies only to samples taken after fasting for at least 8 hours.  Resp Panel by RT-PCR (Flu A&B, Covid) Nasopharyngeal Swab     Status: None   Collection Time: 03/31/21  9:38 PM   Specimen: Nasopharyngeal Swab; Nasopharyngeal(NP) swabs in vial transport medium  Result Value Ref Range   SARS Coronavirus 2 by RT PCR NEGATIVE NEGATIVE    Comment:  (NOTE) SARS-CoV-2 target nucleic acids are NOT DETECTED.  The SARS-CoV-2 RNA is generally detectable in upper respiratory specimens during the acute phase of infection. The lowest concentration of SARS-CoV-2 viral copies this assay can detect is 138 copies/mL. A negative result does not preclude SARS-Cov-2 infection and should not be used as the sole basis for treatment or other patient management decisions. A negative result may occur with  improper specimen collection/handling, submission of specimen other than nasopharyngeal swab, presence of viral mutation(s) within the areas targeted by this assay, and inadequate number of viral copies(<138 copies/mL). A negative result must be combined with clinical observations, patient history, and epidemiological information. The expected result is Negative.  Fact Sheet for Patients:  EntrepreneurPulse.com.au  Fact Sheet for Healthcare Providers:  IncredibleEmployment.be  This test is no t yet approved or cleared by the [place] FDA and  has been authorized for detection and/or diagnosis of SARS-CoV-2 by FDA under an Emergency Use Authorization (EUA). This EUA will remain  in effect (meaning this test can be used) for the duration of the COVID-19 declaration under Section 564(b)(1) of the Act, 21 U.S.C.section 360bbb-3(b)(1), unless the authorization is terminated  or revoked sooner.       Influenza A by PCR NEGATIVE NEGATIVE   Influenza B by PCR NEGATIVE NEGATIVE    Comment: (NOTE) The Xpert Xpress SARS-CoV-2/FLU/RSV plus assay is intended as an aid in the diagnosis of influenza from Nasopharyngeal swab specimens and should not be used as a sole basis for treatment. Nasal washings and aspirates are unacceptable for Xpert Xpress SARS-CoV-2/FLU/RSV testing.  Fact Sheet for Patients: EntrepreneurPulse.com.au  Fact Sheet for Healthcare  Providers: IncredibleEmployment.be  This test is not yet approved or cleared by the [place] FDA and has been authorized for detection and/or diagnosis of SARS-CoV-2 by FDA under an Emergency Use Authorization (EUA). This EUA will remain in effect (meaning this test can be used) for the duration of the COVID-19 declaration under Section 564(b)(1) of the Act, 21 U.S.C. section 360bbb-3(b)(1), unless the authorization is terminated or revoked.  Performed at [facility], [place],  [address]   Troponin I (High Sensitivity)     Status: Abnormal   Collection Time: 03/31/21  9:38 PM  Result Value Ref Range   Troponin I (High Sensitivity) 30 (H) <18 ng/L    Comment: (NOTE) Elevated high sensitivity troponin I (  hsTnI) values and significant  changes across serial measurements may suggest ACS but many other  chronic and acute conditions are known to elevate hsTnI results.  Refer to the "Links" section for chest pain algorithms and additional  guidance. Performed at [facility], [address] [place], [place] [address]   D-dimer, quantitative     Status: Abnormal   Collection Time: 03/31/21  9:38 PM  Result Value Ref Range   D-Dimer, Quant 1.63 (H) 0.00 - 0.50 ug/mL-FEU    Comment: (NOTE) At the manufacturer cut-off value of 0.5 g/mL FEU, this assay has a negative predictive value of 95-100%.This assay is intended for use in conjunction with a clinical pretest probability (PTP) assessment model to exclude pulmonary embolism (PE) and deep venous thrombosis (DVT) in outpatients suspected of PE or DVT. Results should be correlated with clinical presentation. Performed at [facility], [address] [place], [place] [address]   Basic metabolic panel     Status: Abnormal   Collection Time: 04/01/21 12:23 AM  Result Value Ref Range   Sodium 135 135 - 145 mmol/L   Potassium 3.5 3.5 - 5.1 mmol/L   Chloride  100 98 - 111 mmol/L   CO2 30 22 - 32 mmol/L   Glucose, Bld 234 (H) 70 - 99 mg/dL    Comment: Glucose reference range applies only to samples taken after fasting for at least 8 hours.   BUN 10 8 - 23 mg/dL   Creatinine, Ser 0.61 0.44 - 1.00 mg/dL   Calcium 8.8 (L) 8.9 - 10.3 mg/dL   GFR, Estimated >60 >60 mL/min    Comment: (NOTE) Calculated using the CKD-EPI Creatinine Equation (2021)    Anion gap 5 5 - 15    Comment: Performed at [facility], [place], [place] [address]  Troponin I (High Sensitivity)     Status: Abnormal   Collection Time: 04/01/21 12:23 AM  Result Value Ref Range   Troponin I (High Sensitivity) 30 (H) <18 ng/L    Comment: (NOTE) Elevated high sensitivity troponin I (hsTnI) values and significant  changes across serial measurements may suggest ACS but many other  chronic and acute conditions are known to elevate hsTnI results.  Refer to the "Links" section for chest pain algorithms and additional  guidance. Performed at [facility], [place], [place] [address]   Troponin I (High Sensitivity)     Status: Abnormal   Collection Time: 04/01/21  1:39 AM  Result Value Ref Range   Troponin I (High Sensitivity) 24 (H) <18 ng/L    Comment: (NOTE) Elevated high sensitivity troponin I (hsTnI) values and significant  changes across serial measurements may suggest ACS but many other  chronic and acute conditions are known to elevate hsTnI results.  Refer to the "Links" section for chest pain algorithms and additional  guidance. Performed at [facility], [address] [place],  [address]   Glucose, capillary     Status: Abnormal   Collection Time: 04/01/21  8:05 AM  Result Value Ref Range   Glucose-Capillary 412 (H) 70 - 99 mg/dL    Comment: Glucose reference range applies only to samples taken after fasting for at least 8 hours.  Glucose, capillary     Status: Abnormal   Collection Time: 04/01/21  11:44 AM  Result Value Ref Range   Glucose-Capillary 373 (H) 70 - 99 mg/dL    Comment: Glucose reference range applies only to samples taken  after fasting for at least 8 hours.  Glucose, capillary     Status: Abnormal   Collection Time: 04/01/21  4:44 PM  Result Value Ref Range   Glucose-Capillary 174 (H) 70 - 99 mg/dL    Comment: Glucose reference range applies only to samples taken after fasting for at least 8 hours.  Glucose, capillary     Status: Abnormal   Collection Time: 04/01/21  8:41 PM  Result Value Ref Range   Glucose-Capillary 291 (H) 70 - 99 mg/dL    Comment: Glucose reference range applies only to samples taken after fasting for at least 8 hours.   Comment 1 Notify RN   Hemoglobin A1c     Status: Abnormal   Collection Time: 04/02/21  3:45 AM  Result Value Ref Range   Hgb A1c MFr Bld 8.2 (H) 4.8 - 5.6 %    Comment: (NOTE) Pre diabetes:          5.7%-6.4%  Diabetes:              >6.4%  Glycemic control for   <7.0% adults with diabetes    Mean Plasma Glucose 188.64 mg/dL    Comment: Performed at [facility] [address] [place], [place] [address]  Glucose, capillary     Status: Abnormal   Collection Time: 04/02/21  8:08 AM  Result Value Ref Range   Glucose-Capillary 184 (H) 70 - 99 mg/dL    Comment: Glucose reference range applies only to samples taken after fasting for at least 8 hours.  Glucose, capillary     Status: Abnormal   Collection Time: 04/02/21 11:07 AM  Result Value Ref Range   Glucose-Capillary 305 (H) 70 - 99 mg/dL    Comment: Glucose reference range applies only to samples taken after fasting for at least 8 hours.  Glucose, capillary     Status: Abnormal   Collection Time: 04/02/21  4:48 PM  Result Value Ref Range   Glucose-Capillary 317 (H) 70 - 99 mg/dL    Comment: Glucose reference range applies only to samples taken after fasting for at least 8 hours.  Glucose, capillary     Status: Abnormal   Collection Time: 04/02/21  9:28 PM   Result Value Ref Range   Glucose-Capillary 324 (H) 70 - 99 mg/dL    Comment: Glucose reference range applies only to samples taken after fasting for at least 8 hours.   Comment 1 Notify RN   Basic metabolic panel     Status: Abnormal   Collection Time: 04/03/21  4:23 AM  Result Value Ref Range   Sodium 135 135 - 145 mmol/L   Potassium 4.4 3.5 - 5.1 mmol/L   Chloride 93 (L) 98 - 111 mmol/L   CO2 31 22 - 32 mmol/L   Glucose, Bld 360 (H) 70 - 99 mg/dL    Comment: Glucose reference range applies only to samples taken after fasting for at least 8 hours.   BUN 20 8 - 23 mg/dL   Creatinine, Ser 0.58 0.44 - 1.00 mg/dL   Calcium 8.7 (L) 8.9 - 10.3 mg/dL   GFR, Estimated >60 >60 mL/min    Comment: (NOTE) Calculated using the CKD-EPI Creatinine Equation (2021)    Anion gap 11 5 - 15    Comment: Performed at [facility], [address] [place], [place] [address]  CBC     Status: Abnormal   Collection Time: 04/03/21  4:23 AM  Result Value Ref Range  WBC 7.8 4.0 - 10.5 K/uL   RBC 3.47 (L) 3.87 - 5.11 MIL/uL   Hemoglobin 10.3 (L) 12.0 - 15.0 g/dL   HCT 32.0 (L) 36.0 - 46.0 %   MCV 92.2 80.0 - 100.0 fL   MCH 29.7 26.0 - 34.0 pg   MCHC 32.2 30.0 - 36.0 g/dL   RDW 13.5 11.5 - 15.5 %   Platelets 261 150 - 400 K/uL   nRBC 0.0 0.0 - 0.2 %    Comment: Performed at [facility], [address], [place], [place] [address]  Magnesium     Status: None   Collection Time: 04/03/21  4:23 AM  Result Value Ref Range   Magnesium 2.3 1.7 - 2.4 mg/dL    Comment: Performed at [facility], [address] [place], [place] [address]  Glucose, capillary     Status: Abnormal   Collection Time: 04/03/21  7:32 AM  Result Value Ref Range   Glucose-Capillary 317 (H) 70 - 99 mg/dL    Comment: Glucose reference range applies only to samples taken after fasting for at least 8 hours.  ECHOCARDIOGRAM COMPLETE     Status: None   Collection Time: 04/03/21  7:56 AM  Result Value Ref  Range   Weight 1,668.44 oz   Height 59 in   BP 128/81 mmHg   S' Lateral 2.40 cm  Glucose, capillary     Status: Abnormal   Collection Time: 04/03/21 11:16 AM  Result Value Ref Range   Glucose-Capillary 118 (H) 70 - 99 mg/dL    Comment: Glucose reference range applies only to samples taken after fasting for at least 8 hours.  Glucose, capillary     Status: Abnormal   Collection Time: 04/03/21  4:37 PM  Result Value Ref Range   Glucose-Capillary 519 (HH) 70 - 99 mg/dL    Comment: Glucose reference range applies only to samples taken after fasting for at least 8 hours.   Comment 1 Notify RN   Glucose, capillary     Status: Abnormal   Collection Time: 04/03/21  5:21 PM  Result Value Ref Range   Glucose-Capillary 470 (H) 70 - 99 mg/dL    Comment: Glucose reference range applies only to samples taken after fasting for at least 8 hours.  Glucose, capillary     Status: Abnormal   Collection Time: 04/03/21 10:11 PM  Result Value Ref Range   Glucose-Capillary 248 (H) 70 - 99 mg/dL    Comment: Glucose reference range applies only to samples taken after fasting for at least 8 hours.   Comment 1 Notify RN   Basic metabolic panel     Status: Abnormal   Collection Time: 04/04/21  3:16 AM  Result Value Ref Range   Sodium 135 135 - 145 mmol/L   Potassium 3.7 3.5 - 5.1 mmol/L   Chloride 99 98 - 111 mmol/L   CO2 30 22 - 32 mmol/L   Glucose, Bld 220 (H) 70 - 99 mg/dL    Comment: Glucose reference range applies only to samples taken after fasting for at least 8 hours.   BUN 26 (H) 8 - 23 mg/dL   Creatinine, Ser 0.77 0.44 - 1.00 mg/dL   Calcium 8.3 (L) 8.9 - 10.3 mg/dL   GFR, Estimated >60 >60 mL/min    Comment: (NOTE) Calculated using the CKD-EPI Creatinine Equation (2021)    Anion gap 6 5 - 15    Comment: Performed at [facility], [address]  [place], [place] [address]  CBC     Status: Abnormal   Collection Time: 04/04/21  3:16 AM  Result Value Ref Range   WBC 9.0 4.0 -  10.5 K/uL   RBC 3.39 (L) 3.87 - 5.11 MIL/uL   Hemoglobin 9.7 (L) 12.0 - 15.0 g/dL   HCT 30.9 (L) 36.0 - 46.0 %   MCV 91.2 80.0 - 100.0 fL   MCH 28.6 26.0 - 34.0 pg   MCHC 31.4 30.0 - 36.0 g/dL   RDW 13.7 11.5 - 15.5 %   Platelets 261 150 - 400 K/uL   nRBC 0.0 0.0 - 0.2 %    Comment: Performed at [facility], [address], [place], [place] [address]  Magnesium     Status: None   Collection Time: 04/04/21  3:16 AM  Result Value Ref Range   Magnesium 2.3 1.7 - 2.4 mg/dL    Comment: Performed at [facility], [address] [place], [place] [address]  Glucose, capillary     Status: Abnormal   Collection Time: 04/04/21  7:42 AM  Result Value Ref Range   Glucose-Capillary 177 (H) 70 - 99 mg/dL    Comment: Glucose reference range applies only to samples taken after fasting for at least 8 hours.  Glucose, capillary     Status: Abnormal   Collection Time: 04/04/21 12:37 PM  Result Value Ref Range   Glucose-Capillary 351 (H) 70 - 99 mg/dL    Comment: Glucose reference range applies only to samples taken after fasting for at least 8 hours.  Glucose, capillary     Status: Abnormal   Collection Time: 04/04/21  4:30 PM  Result Value Ref Range   Glucose-Capillary 356 (H) 70 - 99 mg/dL    Comment: Glucose reference range applies only to samples taken after fasting for at least 8 hours.  Glucose, capillary     Status: Abnormal   Collection Time: 04/04/21  8:51 PM  Result Value Ref Range   Glucose-Capillary 305 (H) 70 - 99 mg/dL    Comment: Glucose reference range applies only to samples taken after fasting for at least 8 hours.   Comment 1 Notify RN   Basic metabolic panel     Status: Abnormal   Collection Time: 04/05/21  4:30 AM  Result Value Ref Range   Sodium 137 135 - 145 mmol/L   Potassium 3.9 3.5 - 5.1 mmol/L   Chloride 100 98 - 111 mmol/L   CO2 34 (H) 22 - 32 mmol/L   Glucose, Bld 204 (H) 70 - 99 mg/dL    Comment: Glucose reference range applies only to  samples taken after fasting for at least 8 hours.   BUN 23 8 - 23 mg/dL   Creatinine, Ser 0.65 0.44 - 1.00 mg/dL   Calcium 8.6 (L) 8.9 - 10.3 mg/dL   GFR, Estimated >60 >60 mL/min    Comment: (NOTE) Calculated using the CKD-EPI Creatinine Equation (2021)    Anion gap 3 (L) 5 - 15    Comment: Performed at [facility], [address] [place], [place] [address]  CBC     Status: Abnormal   Collection Time: 04/05/21  4:30 AM  Result Value Ref Range   WBC 8.8 4.0 - 10.5 K/uL   RBC 3.30 (L) 3.87 - 5.11 MIL/uL   Hemoglobin 9.7 (L) 12.0 - 15.0 g/dL   HCT 30.9 (L) 36.0 - 46.0 %   MCV 93.6 80.0 - 100.0 fL   MCH 29.4  26.0 - 34.0 pg   MCHC 31.4 30.0 - 36.0 g/dL   RDW 13.7 11.5 - 15.5 %   Platelets 259 150 - 400 K/uL   nRBC 0.2 0.0 - 0.2 %    Comment: Performed at [facility], [address] [place], [place] [address]  Magnesium     Status: None   Collection Time: 04/05/21  4:30 AM  Result Value Ref Range   Magnesium 2.3 1.7 - 2.4 mg/dL    Comment: Performed at [facility], [address] [place], [place] [address]  Glucose, capillary     Status: Abnormal   Collection Time: 04/05/21  7:43 AM  Result Value Ref Range   Glucose-Capillary 173 (H) 70 - 99 mg/dL    Comment: Glucose reference range applies only to samples taken after fasting for at least 8 hours.  Glucose, capillary     Status: Abnormal   Collection Time: 04/05/21 12:40 PM  Result Value Ref Range   Glucose-Capillary 291 (H) 70 - 99 mg/dL    Comment: Glucose reference range applies only to samples taken after fasting for at least 8 hours.  CULTURE, URINE COMPREHENSIVE     Status: None   Collection Time: 04/22/21  9:56 AM   Specimen: Urine   Urine  Result Value Ref Range   Urine Culture, Comprehensive Final report    Organism ID, Bacteria Comment     Comment: Mixed urogenital flora 1,000 Colonies/mL   POCT Urinalysis Dipstick     Status: Abnormal   Collection Time: 04/22/21  9:59 AM   Result Value Ref Range   Color, UA     Clarity, UA     Glucose, UA Negative Negative   Bilirubin, UA negative    Ketones, UA negative    Spec Grav, UA 1.010 1.010 - 1.025   Blood, UA neg    pH, UA 6.0 5.0 - 8.0   Protein, UA Positive (A) Negative    Comment: +100   Urobilinogen, UA 0.2 0.2 or 1.0 E.U./dL   Nitrite, UA neg    Leukocytes, UA Negative Negative   Appearance     Odor    Basic metabolic panel     Status: Abnormal   Collection Time: 04/30/21  9:47 PM  Result Value Ref Range   Sodium 134 (L) 135 - 145 mmol/L   Potassium 3.6 3.5 - 5.1 mmol/L    Comment: HEMOLYSIS AT THIS LEVEL MAY AFFECT RESULT   Chloride 100 98 - 111 mmol/L   CO2 27 22 - 32 mmol/L   Glucose, Bld 174 (H) 70 - 99 mg/dL    Comment: Glucose reference range applies only to samples taken after fasting for at least 8 hours.   BUN 19 8 - 23 mg/dL   Creatinine, Ser 0.60 0.44 - 1.00 mg/dL   Calcium 8.6 (L) 8.9 - 10.3 mg/dL   GFR, Estimated >60 >60 mL/min    Comment: (NOTE) Calculated using the CKD-EPI Creatinine Equation (2021)    Anion gap 7 5 - 15    Comment: Performed at [facility], [address] [place], [place] [address]  CBC     Status: Abnormal   Collection Time: 04/30/21  9:47 PM  Result Value Ref Range   WBC 6.2 4.0 - 10.5 K/uL   RBC 3.46 (L) 3.87 - 5.11 MIL/uL   Hemoglobin 10.1 (L) 12.0 - 15.0 g/dL   HCT 31.8 (L) 36.0 - 46.0 %   MCV 91.9 80.0 - 100.0  fL   MCH 29.2 26.0 - 34.0 pg   MCHC 31.8 30.0 - 36.0 g/dL   RDW 13.4 11.5 - 15.5 %   Platelets 273 150 - 400 K/uL   nRBC 0.0 0.0 - 0.2 %    Comment: Performed at [facility], [place], [place] [address]  Troponin I (High Sensitivity)     Status: Abnormal   Collection Time: 04/30/21  9:47 PM  Result Value Ref Range   Troponin I (High Sensitivity) 35 (H) <18 ng/L    Comment: (NOTE) Elevated high sensitivity troponin I (hsTnI) values and significant  changes across serial measurements may suggest ACS  but many other  chronic and acute conditions are known to elevate hsTnI results.  Refer to the "Links" section for chest pain algorithms and additional  guidance. Performed at [facility], [address] [place], [place] [address]   Brain natriuretic peptide     Status: None   Collection Time: 04/30/21  9:47 PM  Result Value Ref Range   B Natriuretic Peptide 29.3 0.0 - 100.0 pg/mL    Comment: Performed at [facility], [address] [place], [place] [address]  Resp Panel by RT-PCR (Flu A&B, Covid) Nasopharyngeal Swab     Status: None   Collection Time: 04/30/21 10:24 PM   Specimen: Nasopharyngeal Swab; Nasopharyngeal(NP) swabs in vial transport medium  Result Value Ref Range   SARS Coronavirus 2 by RT PCR NEGATIVE NEGATIVE    Comment: (NOTE) SARS-CoV-2 target nucleic acids are NOT DETECTED.  The SARS-CoV-2 RNA is generally detectable in upper respiratory specimens during the acute phase of infection. The lowest concentration of SARS-CoV-2 viral copies this assay can detect is 138 copies/mL. A negative result does not preclude SARS-Cov-2 infection and should not be used as the sole basis for treatment or other patient management decisions. A negative result may occur with  improper specimen collection/handling, submission of specimen other than nasopharyngeal swab, presence of viral mutation(s) within the areas targeted by this assay, and inadequate number of viral copies(<138 copies/mL). A negative result must be combined with clinical observations, patient history, and epidemiological information. The expected result is Negative.  Fact Sheet for Patients:  EntrepreneurPulse.com.au  Fact Sheet for Healthcare Providers:  IncredibleEmployment.be  This test is no t yet approved or cleared by the [place] FDA and  has been authorized for detection and/or diagnosis of SARS-CoV-2 by FDA under an Emergency Use  Authorization (EUA). This EUA will remain  in effect (meaning this test can be used) for the duration of the COVID-19 declaration under Section 564(b)(1) of the Act, 21 U.S.C.section 360bbb-3(b)(1), unless the authorization is terminated  or revoked sooner.       Influenza A by PCR NEGATIVE NEGATIVE   Influenza B by PCR NEGATIVE NEGATIVE    Comment: (NOTE) The Xpert Xpress SARS-CoV-2/FLU/RSV plus assay is intended as an aid in the diagnosis of influenza from Nasopharyngeal swab specimens and should not be used as a sole basis for treatment. Nasal washings and aspirates are unacceptable for Xpert Xpress SARS-CoV-2/FLU/RSV testing.  Fact Sheet for Patients: EntrepreneurPulse.com.au  Fact Sheet for Healthcare Providers: IncredibleEmployment.be  This test is not yet approved or cleared by the [place] FDA and has been authorized for detection and/or diagnosis of SARS-CoV-2 by FDA under an Emergency Use Authorization (EUA). This EUA will remain in effect (meaning this test can be used) for the duration of the COVID-19 declaration under Section 564(b)(1) of the Act, 21  U.S.C. section 360bbb-3(b)(1), unless the authorization is terminated or revoked.  Performed at [facility], [place], [place] [address]   Troponin I (High Sensitivity)     Status: Abnormal   Collection Time: 04/30/21 11:41 PM  Result Value Ref Range   Troponin I (High Sensitivity) 43 (H) <18 ng/L    Comment: (NOTE) Elevated high sensitivity troponin I (hsTnI) values and significant  changes across serial measurements may suggest ACS but many other  chronic and acute conditions are known to elevate hsTnI results.  Refer to the "Links" section for chest pain algorithms and additional  guidance. Performed at [facility], [address] [place], [place] [address]   Magnesium     Status: None   Collection Time: 04/30/21 11:41 PM  Result  Value Ref Range   Magnesium 2.0 1.7 - 2.4 mg/dL    Comment: Performed at [facility], [address] [place], [place] [address]  Procalcitonin - Baseline     Status: None   Collection Time: 04/30/21 11:41 PM  Result Value Ref Range   Procalcitonin <0.10 ng/mL    Comment:        Interpretation: PCT (Procalcitonin) <= 0.5 ng/mL: Systemic infection (sepsis) is not likely. Local bacterial infection is possible. (NOTE)       Sepsis PCT Algorithm           Lower Respiratory Tract                                      Infection PCT Algorithm    ----------------------------     ----------------------------         PCT < 0.25 ng/mL                PCT < 0.10 ng/mL          Strongly encourage             Strongly discourage   discontinuation of antibiotics    initiation of antibiotics    ----------------------------     -----------------------------       PCT 0.25 - 0.50 ng/mL            PCT 0.10 - 0.25 ng/mL               OR       >80% decrease in PCT            Discourage initiation of                                            antibiotics      Encourage discontinuation           of antibiotics    ----------------------------     -----------------------------         PCT >= 0.50 ng/mL              PCT 0.26 - 0.50 ng/mL               AND        <80% decrease in PCT             Encourage initiation of  antibiotics       Encourage continuation           of antibiotics    ----------------------------     -----------------------------        PCT >= 0.50 ng/mL                  PCT > 0.50 ng/mL               AND         increase in PCT                  Strongly encourage                                      initiation of antibiotics    Strongly encourage escalation           of antibiotics                                     -----------------------------                                           PCT <= 0.25 ng/mL                                                  OR                                        > 80% decrease in PCT                                      Discontinue / Do not initiate                                             antibiotics  Performed at [facility], [address] [place], [place] [address]   Phosphorus     Status: None   Collection Time: 05/01/21  6:19 AM  Result Value Ref Range   Phosphorus 3.3 2.5 - 4.6 mg/dL    Comment: Performed at [facility], [address] [place], [place] [address]  Magnesium     Status: None   Collection Time: 05/01/21  6:19 AM  Result Value Ref Range   Magnesium 2.0 1.7 - 2.4 mg/dL    Comment: Performed at [facility], [address] [place], [place] [address]  Comprehensive metabolic panel     Status: Abnormal   Collection Time: 05/01/21  6:19 AM  Result Value Ref Range   Sodium 133 (L) 135 - 145 mmol/L   Potassium 4.6 3.5 - 5.1 mmol/L   Chloride 101 98 - 111 mmol/L   CO2 25 22 - 32 mmol/L   Glucose, Bld 347 (H) 70 - 99 mg/dL  Comment: Glucose reference range applies only to samples taken after fasting for at least 8 hours.   BUN 16 8 - 23 mg/dL   Creatinine, Ser 0.58 0.44 - 1.00 mg/dL   Calcium 8.8 (L) 8.9 - 10.3 mg/dL   Total Protein 6.9 6.5 - 8.1 g/dL   Albumin 3.7 3.5 - 5.0 g/dL   AST 30 15 - 41 U/L   ALT 23 0 - 44 U/L   Alkaline Phosphatase 63 38 - 126 U/L   Total Bilirubin 0.5 0.3 - 1.2 mg/dL   GFR, Estimated >60 >60 mL/min    Comment: (NOTE) Calculated using the CKD-EPI Creatinine Equation (2021)    Anion gap 7 5 - 15    Comment: Performed at [facility], [address] [place], [place] [address]  CBC     Status: Abnormal   Collection Time: 05/01/21  6:19 AM  Result Value Ref Range   WBC 8.5 4.0 - 10.5 K/uL   RBC 3.51 (L) 3.87 - 5.11 MIL/uL   Hemoglobin 10.4 (L) 12.0 - 15.0 g/dL   HCT 32.3 (L) 36.0 - 46.0 %   MCV 92.0 80.0 - 100.0 fL   MCH 29.6 26.0 - 34.0 pg   MCHC 32.2 30.0 - 36.0 g/dL    RDW 13.5 11.5 - 15.5 %   Platelets 280 150 - 400 K/uL   nRBC 0.0 0.0 - 0.2 %    Comment: Performed at [facility], [address] [place], [place] [address]  Blood gas, venous     Status: Abnormal   Collection Time: 05/01/21  6:19 AM  Result Value Ref Range   pH, Ven 7.37 7.250 - 7.430   pCO2, Ven 39 (L) 44.0 - 60.0 mmHg   pO2, Ven 131.0 (H) 32.0 - 45.0 mmHg   Bicarbonate 22.5 20.0 - 28.0 mmol/L   Acid-base deficit 2.5 (H) 0.0 - 2.0 mmol/L   O2 Saturation 98.9 %   Patient temperature 37.0    Sample type VEIN     Comment: Performed at [facility], [place], [place] [address]  Troponin I (High Sensitivity)     Status: Abnormal   Collection Time: 05/01/21  6:19 AM  Result Value Ref Range   Troponin I (High Sensitivity) 29 (H) <18 ng/L    Comment: (NOTE) Elevated high sensitivity troponin I (hsTnI) values and significant  changes across serial measurements may suggest ACS but many other  chronic and acute conditions are known to elevate hsTnI results.  Refer to the "Links" section for chest pain algorithms and additional  guidance. Performed at [facility], [address] [place], [place] [address]   CBG monitoring, ED     Status: Abnormal   Collection Time: 05/01/21  8:20 AM  Result Value Ref Range   Glucose-Capillary 290 (H) 70 - 99 mg/dL    Comment: Glucose reference range applies only to samples taken after fasting for at least 8 hours.  CBG monitoring, ED     Status: Abnormal   Collection Time: 05/01/21 11:57 AM  Result Value Ref Range   Glucose-Capillary 332 (H) 70 - 99 mg/dL    Comment: Glucose reference range applies only to samples taken after fasting for at least 8 hours.  Glucose, capillary     Status: Abnormal   Collection Time: 05/01/21  4:43 PM  Result Value Ref Range   Glucose-Capillary 251 (H) 70 - 99 mg/dL    Comment: Glucose reference range applies only to  samples taken after fasting for at least 8 hours.   Brain natriuretic peptide     Status: None   Collection Time: 05/01/21  8:21 PM  Result Value Ref Range   B Natriuretic Peptide 77.6 0.0 - 100.0 pg/mL    Comment: Performed at [facility], [address] [place], [place] [address]  D-dimer, quantitative     Status: Abnormal   Collection Time: 05/01/21  8:21 PM  Result Value Ref Range   D-Dimer, Quant 2.38 (H) 0.00 - 0.50 ug/mL-FEU    Comment: (NOTE) At the manufacturer cut-off value of 0.5 g/mL FEU, this assay has a negative predictive value of 95-100%.This assay is intended for use in conjunction with a clinical pretest probability (PTP) assessment model to exclude pulmonary embolism (PE) and deep venous thrombosis (DVT) in outpatients suspected of PE or DVT. Results should be correlated with clinical presentation. Performed at [facility], [address] [place], [place] [address]   Glucose, capillary     Status: Abnormal   Collection Time: 05/01/21  9:31 PM  Result Value Ref Range   Glucose-Capillary 285 (H) 70 - 99 mg/dL    Comment: Glucose reference range applies only to samples taken after fasting for at least 8 hours.  Glucose, capillary     Status: Abnormal   Collection Time: 05/02/21  7:23 AM  Result Value Ref Range   Glucose-Capillary 152 (H) 70 - 99 mg/dL    Comment: Glucose reference range applies only to samples taken after fasting for at least 8 hours.  Vitamin B12     Status: None   Collection Time: 05/02/21  7:58 AM  Result Value Ref Range   Vitamin B-12 748 180 - 914 pg/mL    Comment: (NOTE) This assay is not validated for testing neonatal or myeloproliferative syndrome specimens for Vitamin B12 levels. Performed at [facility], [address] [place], [place] [address]   Folate     Status: None   Collection Time: 05/02/21  7:58 AM  Result Value Ref Range   Folate 14.8 >5.9 ng/mL    Comment: Performed at [facility], [address] [place], [place] [address]   Iron and TIBC     Status: Abnormal   Collection Time: 05/02/21  7:58 AM  Result Value Ref Range   Iron 41 28 - 170 ug/dL   TIBC 395 250 - 450 ug/dL   Saturation Ratios 10 (L) 10.4 - 31.8 %   UIBC 354 ug/dL    Comment: Performed at [facility], [address] [place], [place] [address]  Ferritin     Status: None   Collection Time: 05/02/21  7:58 AM  Result Value Ref Range   Ferritin 21 11 - 307 ng/mL    Comment: Performed at [facility], [address] [place], [place] [address]  Reticulocytes     Status: Abnormal   Collection Time: 05/02/21  7:58 AM  Result Value Ref Range   Retic Ct Pct 2.4 0.4 - 3.1 %   RBC. 3.42 (L) 3.87 - 5.11 MIL/uL   Retic Count, Absolute 82.4 19.0 - 186.0 K/uL   Immature Retic Fract 24.2 (H) 2.3 - 15.9 %    Comment: Performed at [facility], [address] [place],  [address]  CBC     Status: Abnormal   Collection Time: 05/02/21  7:58 AM  Result Value Ref Range   WBC 9.7 4.0 - 10.5 K/uL   RBC  3.35 (L) 3.87 - 5.11 MIL/uL   Hemoglobin 9.9 (L) 12.0 - 15.0 g/dL   HCT 31.2 (L) 36.0 - 46.0 %   MCV 93.1 80.0 - 100.0 fL   MCH 29.6 26.0 - 34.0 pg   MCHC 31.7 30.0 - 36.0 g/dL   RDW 13.9 11.5 - 15.5 %   Platelets 272 150 - 400 K/uL   nRBC 0.0 0.0 - 0.2 %    Comment: Performed at [facility], [address], [place], [place] [address]  Basic metabolic panel     Status: Abnormal   Collection Time: 05/02/21  7:58 AM  Result Value Ref Range   Sodium 138 135 - 145 mmol/L   Potassium 4.0 3.5 - 5.1 mmol/L   Chloride 103 98 - 111 mmol/L   CO2 31 22 - 32 mmol/L   Glucose, Bld 151 (H) 70 - 99 mg/dL    Comment: Glucose reference range applies only to samples taken after fasting for at least 8 hours.   BUN 19 8 - 23 mg/dL   Creatinine, Ser 0.45 0.44 - 1.00 mg/dL   Calcium 8.6 (L) 8.9 - 10.3 mg/dL   GFR, Estimated >60 >60 mL/min    Comment: (NOTE) Calculated using the CKD-EPI Creatinine Equation (2021)    Anion  gap 4 (L) 5 - 15    Comment: Performed at [facility], [address] [place], [place] [address]  Glucose, capillary     Status: Abnormal   Collection Time: 05/02/21 11:15 AM  Result Value Ref Range   Glucose-Capillary 291 (H) 70 - 99 mg/dL    Comment: Glucose reference range applies only to samples taken after fasting for at least 8 hours.  Glucose, capillary     Status: Abnormal   Collection Time: 05/02/21  4:39 PM  Result Value Ref Range   Glucose-Capillary 294 (H) 70 - 99 mg/dL    Comment: Glucose reference range applies only to samples taken after fasting for at least 8 hours.  Glucose, capillary     Status: Abnormal   Collection Time: 05/02/21 10:44 PM  Result Value Ref Range   Glucose-Capillary 220 (H) 70 - 99 mg/dL    Comment: Glucose reference range applies only to samples taken after fasting for at least 8 hours.  CBC     Status: Abnormal   Collection Time: 05/03/21  4:17 AM  Result Value Ref Range   WBC 10.4 4.0 - 10.5 K/uL   RBC 3.19 (L) 3.87 - 5.11 MIL/uL   Hemoglobin 9.2 (L) 12.0 - 15.0 g/dL   HCT 29.6 (L) 36.0 - 46.0 %   MCV 92.8 80.0 - 100.0 fL   MCH 28.8 26.0 - 34.0 pg   MCHC 31.1 30.0 - 36.0 g/dL   RDW 13.8 11.5 - 15.5 %   Platelets 263 150 - 400 K/uL   nRBC 0.0 0.0 - 0.2 %    Comment: Performed at [facility], [address], [place], [place] [address]  Basic metabolic panel     Status: Abnormal   Collection Time: 05/03/21  4:17 AM  Result Value Ref Range   Sodium 138 135 - 145 mmol/L   Potassium 4.1 3.5 - 5.1 mmol/L   Chloride 103 98 - 111 mmol/L   CO2 32 22 - 32 mmol/L   Glucose, Bld 154 (H) 70 - 99 mg/dL    Comment: Glucose reference range applies only to samples taken after fasting for at least 8 hours.   BUN 23  8 - 23 mg/dL   Creatinine, Ser 0.50 0.44 - 1.00 mg/dL   Calcium 8.4 (L) 8.9 - 10.3 mg/dL   GFR, Estimated >60 >60 mL/min    Comment: (NOTE) Calculated using the CKD-EPI Creatinine Equation (2021)    Anion gap 3 (L) 5  - 15    Comment: Performed at [facility], [address] [place], [place] [address]  Glucose, capillary     Status: Abnormal   Collection Time: 05/03/21  8:02 AM  Result Value Ref Range   Glucose-Capillary 100 (H) 70 - 99 mg/dL    Comment: Glucose reference range applies only to samples taken after fasting for at least 8 hours.  Glucose, capillary     Status: Abnormal   Collection Time: 05/03/21 11:16 AM  Result Value Ref Range   Glucose-Capillary 235 (H) 70 - 99 mg/dL    Comment: Glucose reference range applies only to samples taken after fasting for at least 8 hours.  Glucose, capillary     Status: Abnormal   Collection Time: 05/03/21  4:26 PM  Result Value Ref Range   Glucose-Capillary 338 (H) 70 - 99 mg/dL    Comment: Glucose reference range applies only to samples taken after fasting for at least 8 hours.  Glucose, capillary     Status: Abnormal   Collection Time: 05/03/21  9:00 PM  Result Value Ref Range   Glucose-Capillary 237 (H) 70 - 99 mg/dL    Comment: Glucose reference range applies only to samples taken after fasting for at least 8 hours.  Glucose, capillary     Status: None   Collection Time: 05/04/21  7:47 AM  Result Value Ref Range   Glucose-Capillary 98 70 - 99 mg/dL    Comment: Glucose reference range applies only to samples taken after fasting for at least 8 hours.  Glucose, capillary     Status: Abnormal   Collection Time: 05/04/21 11:45 AM  Result Value Ref Range   Glucose-Capillary 253 (H) 70 - 99 mg/dL    Comment: Glucose reference range applies only to samples taken after fasting for at least 8 hours.  Glucose, capillary     Status: Abnormal   Collection Time: 05/04/21  4:18 PM  Result Value Ref Range   Glucose-Capillary 339 (H) 70 - 99 mg/dL    Comment: Glucose reference range applies only to samples taken after fasting for at least 8 hours.  Glucose, capillary     Status: Abnormal   Collection Time: 05/04/21  9:07 PM  Result Value Ref  Range   Glucose-Capillary 293 (H) 70 - 99 mg/dL    Comment: Glucose reference range applies only to samples taken after fasting for at least 8 hours.  Glucose, capillary     Status: Abnormal   Collection Time: 05/05/21  7:57 AM  Result Value Ref Range   Glucose-Capillary 135 (H) 70 - 99 mg/dL    Comment: Glucose reference range applies only to samples taken after fasting for at least 8 hours.  Glucose, capillary     Status: Abnormal   Collection Time: 05/05/21 11:57 AM  Result Value Ref Range   Glucose-Capillary 290 (H) 70 - 99 mg/dL    Comment: Glucose reference range applies only to samples taken after fasting for at least 8 hours.  Glucose, capillary     Status: Abnormal   Collection Time: 05/05/21  4:26 PM  Result Value Ref Range   Glucose-Capillary 412 (H) 70 - 99 mg/dL    Comment:  Glucose reference range applies only to samples taken after fasting for at least 8 hours.  Glucose, capillary     Status: Abnormal   Collection Time: 05/05/21  8:26 PM  Result Value Ref Range   Glucose-Capillary 248 (H) 70 - 99 mg/dL    Comment: Glucose reference range applies only to samples taken after fasting for at least 8 hours.  Glucose, capillary     Status: Abnormal   Collection Time: 05/06/21  7:31 AM  Result Value Ref Range   Glucose-Capillary 147 (H) 70 - 99 mg/dL    Comment: Glucose reference range applies only to samples taken after fasting for at least 8 hours.  Glucose, capillary     Status: Abnormal   Collection Time: 05/06/21  1:05 PM  Result Value Ref Range   Glucose-Capillary 253 (H) 70 - 99 mg/dL    Comment: Glucose reference range applies only to samples taken after fasting for at least 8 hours.  Glucose, capillary     Status: Abnormal   Collection Time: 05/06/21  3:33 PM  Result Value Ref Range   Glucose-Capillary 188 (H) 70 - 99 mg/dL    Comment: Glucose reference range applies only to samples taken after fasting for at least 8 hours.  Glucose, capillary     Status:  Abnormal   Collection Time: 05/06/21  8:52 PM  Result Value Ref Range   Glucose-Capillary 176 (H) 70 - 99 mg/dL    Comment: Glucose reference range applies only to samples taken after fasting for at least 8 hours.  CBC     Status: Abnormal   Collection Time: 05/07/21  3:33 AM  Result Value Ref Range   WBC 9.1 4.0 - 10.5 K/uL   RBC 3.05 (L) 3.87 - 5.11 MIL/uL   Hemoglobin 8.8 (L) 12.0 - 15.0 g/dL   HCT 27.6 (L) 36.0 - 46.0 %   MCV 90.5 80.0 - 100.0 fL   MCH 28.9 26.0 - 34.0 pg   MCHC 31.9 30.0 - 36.0 g/dL   RDW 13.9 11.5 - 15.5 %   Platelets 264 150 - 400 K/uL   nRBC 0.0 0.0 - 0.2 %    Comment: Performed at [facility], [address] [place], [place] [address]  Basic metabolic panel     Status: Abnormal   Collection Time: 05/07/21  3:33 AM  Result Value Ref Range   Sodium 136 135 - 145 mmol/L   Potassium 3.9 3.5 - 5.1 mmol/L   Chloride 100 98 - 111 mmol/L   CO2 33 (H) 22 - 32 mmol/L   Glucose, Bld 351 (H) 70 - 99 mg/dL    Comment: Glucose reference range applies only to samples taken after fasting for at least 8 hours.   BUN 24 (H) 8 - 23 mg/dL   Creatinine, Ser 0.49 0.44 - 1.00 mg/dL   Calcium 7.9 (L) 8.9 - 10.3 mg/dL   GFR, Estimated >60 >60 mL/min    Comment: (NOTE) Calculated using the CKD-EPI Creatinine Equation (2021)    Anion gap 3 (L) 5 - 15    Comment: Performed at [facility], [address] [place], [place] [address]  Glucose, capillary     Status: Abnormal   Collection Time: 05/07/21  7:33 AM  Result Value Ref Range   Glucose-Capillary 199 (H) 70 - 99 mg/dL    Comment: Glucose reference range applies only to samples taken after fasting for at least 8 hours.  Glucose, capillary  Status: Abnormal   Collection Time: 05/07/21 10:26 AM  Result Value Ref Range   Glucose-Capillary 248 (H) 70 - 99 mg/dL    Comment: Glucose reference range applies only to samples taken after fasting for at least 8 hours.  Glucose, capillary     Status:  Abnormal   Collection Time: 05/07/21  1:13 PM  Result Value Ref Range   Glucose-Capillary 234 (H) 70 - 99 mg/dL    Comment: Glucose reference range applies only to samples taken after fasting for at least 8 hours.  Glucose, capillary     Status: Abnormal   Collection Time: 05/07/21  2:57 PM  Result Value Ref Range   Glucose-Capillary 112 (H) 70 - 99 mg/dL    Comment: Glucose reference range applies only to samples taken after fasting for at least 8 hours.  Glucose, capillary     Status: Abnormal   Collection Time: 05/07/21  4:01 PM  Result Value Ref Range   Glucose-Capillary 116 (H) 70 - 99 mg/dL    Comment: Glucose reference range applies only to samples taken after fasting for at least 8 hours.  Glucose, capillary     Status: Abnormal   Collection Time: 05/07/21  9:05 PM  Result Value Ref Range   Glucose-Capillary 144 (H) 70 - 99 mg/dL    Comment: Glucose reference range applies only to samples taken after fasting for at least 8 hours.  CBC     Status: Abnormal   Collection Time: 05/08/21  4:19 AM  Result Value Ref Range   WBC 6.8 4.0 - 10.5 K/uL   RBC 3.02 (L) 3.87 - 5.11 MIL/uL   Hemoglobin 8.9 (L) 12.0 - 15.0 g/dL   HCT 28.3 (L) 36.0 - 46.0 %   MCV 93.7 80.0 - 100.0 fL   MCH 29.5 26.0 - 34.0 pg   MCHC 31.4 30.0 - 36.0 g/dL   RDW 14.0 11.5 - 15.5 %   Platelets 261 150 - 400 K/uL   nRBC 0.0 0.0 - 0.2 %    Comment: Performed at [facility], [address], [place], [place] [address]  Basic metabolic panel     Status: Abnormal   Collection Time: 05/08/21  4:19 AM  Result Value Ref Range   Sodium 140 135 - 145 mmol/L   Potassium 3.3 (L) 3.5 - 5.1 mmol/L   Chloride 100 98 - 111 mmol/L   CO2 34 (H) 22 - 32 mmol/L   Glucose, Bld 96 70 - 99 mg/dL    Comment: Glucose reference range applies only to samples taken after fasting for at least 8 hours.   BUN 19 8 - 23 mg/dL   Creatinine, Ser 0.48 0.44 - 1.00 mg/dL   Calcium 8.2 (L) 8.9 - 10.3 mg/dL   GFR, Estimated >60  >60 mL/min    Comment: (NOTE) Calculated using the CKD-EPI Creatinine Equation (2021)    Anion gap 6 5 - 15    Comment: Performed at [facility], [address] [place],  [address]  Glucose, capillary     Status: None   Collection Time: 05/08/21  7:40 AM  Result Value Ref Range   Glucose-Capillary 76 70 - 99 mg/dL    Comment: Glucose reference range applies only to samples taken after fasting for at least 8 hours.  Glucose, capillary     Status: Abnormal   Collection Time: 05/08/21 12:06 PM  Result Value Ref Range   Glucose-Capillary 357 (H) 70 - 99 mg/dL  Comment: Glucose reference range applies only to samples taken after fasting for at least 8 hours.    Radiology: DG Chest 2 View  Result Date: 04/30/2021 CLINICAL DATA:  Shortness of breath. EXAM: CHEST - 2 VIEW COMPARISON:  March 31, 2021 FINDINGS: Emphysematous lung disease is seen involving the bilateral upper lobes. Stable, mild to moderate severity linear scarring and/or atelectasis is seen within the right lung base. A mild amount of superimposed right basilar infiltrate is noted. This represents a new finding. There is no evidence of a pleural effusion or pneumothorax. The heart size and mediastinal contours are within normal limits. The visualized skeletal structures are unremarkable. IMPRESSION: 1. Stable, mild to moderate severity right basilar linear scarring and/or atelectasis. 2. Mild superimposed right basilar infiltrate. Electronically Signed   By: Virgina Norfolk M.D.   On: 04/30/2021 21:47   CT Angio Chest Pulmonary Embolism (PE) W or WO Contrast  Result Date: 05/02/2021 CLINICAL DATA:  Shortness of breath EXAM: CT ANGIOGRAPHY CHEST WITH CONTRAST TECHNIQUE: Multidetector CT imaging of the chest was performed using the standard protocol during bolus administration of intravenous contrast. Multiplanar CT image reconstructions and MIPs were obtained to evaluate the vascular anatomy. CONTRAST:  13m  OMNIPAQUE IOHEXOL 350 MG/ML SOLN COMPARISON:  CT chest dated 03/31/2021 FINDINGS: Cardiovascular: Satisfactory opacification the bilateral pulmonary arteries to the lobar level. However, evaluation is constrained by respiratory motion, particularly in the lower lobes. Within that constraint, there is no convincing evidence of pulmonary embolism. Suspected artifact within a segmental right lower lobe pulmonary artery (series 8/image 167) in an area of motion degradation. Although not tailored for evaluation of the thoracic aorta, there is no evidence of thoracic aortic aneurysm or dissection. Atherosclerotic calcifications of the aortic arch. Heart is normal in size.  No pericardial effusion. Mediastinum/Nodes: No suspicious mediastinal lymphadenopathy. Visualized thyroid is unremarkable. Lungs/Pleura: Scarring in the right lung apex (series 9/image 16). Platelike scarring in the inferior right middle lobe (series 9/image 46). Scarring/radiation changes in the right lower lobe (series 9/image 60). These findings are unchanged from the prior. Moderate centrilobular and paraseptal emphysematous changes, upper lung predominant. No suspicious pulmonary nodules. Mild bibasilar atelectasis. No pleural effusion or pneumothorax. Upper Abdomen: Visualized upper abdomen is grossly unremarkable. Musculoskeletal: Post treatment changes involving the right lateral 7th rib (series 7/image 48), unchanged. Mild degenerative changes of the lower thoracic spine. Review of the MIP images confirms the above findings. IMPRESSION: No evidence of central pulmonary embolism, noting motion degradation. Scarring/radiation changes in the right hemithorax, unchanged. Aortic Atherosclerosis (ICD10-I70.0) and Emphysema (ICD10-J43.9). Electronically Signed   By: SJulian HyM.D.   On: 05/02/2021 00:13   DG Chest Port 1 View  Result Date: 05/01/2021 CLINICAL DATA:  Hypoxia EXAM: PORTABLE CHEST 1 VIEW COMPARISON:  04/30/2021 FINDINGS:  Right lower lung scarring. Subpleural reticulation/fibrosis at the lung bases. No pleural effusion or pneumothorax. The heart is normal in size. IMPRESSION: Bilateral lower lung scarring. No evidence of acute cardiopulmonary disease. Electronically Signed   By: SJulian HyM.D.   On: 05/01/2021 21:15    No results found.  DG Chest Port 1 View  Result Date: 05/06/2021 CLINICAL DATA:  Dyspnea. EXAM: PORTABLE CHEST 1 VIEW COMPARISON:  May 01, 2021. FINDINGS: The heart size and mediastinal contours are within normal limits. Left lung is clear. Slightly decreased right basilar opacity is noted suggesting improving atelectasis or infiltrate. The visualized skeletal structures are unremarkable. IMPRESSION: Slightly decreased right basilar opacity as described above. Electronically Signed   By:  Marijo Conception M.D.   On: 05/06/2021 14:40   VAS [name] ABI WITH/WO TBI  Result Date: 05/30/2021  LOWER EXTREMITY DOPPLER STUDY Patient Name:  ALESHKA CORNEY  Date of Exam:   05/27/2021 Medical Rec #: [id number]     Accession #:    [id number] Date of Birth: Sep 07, 1955     Patient Gender: F Patient Age:   34 years Exam Location:  [facility] Procedure:      VAS [name] ABI WITH/WO TBI Referring Phys: [name] --------------------------------------------------------------------------------  Indications: Peripheral artery disease, and 09/29/2018 left SFA to pop PTA and              stents.  Vascular Interventions: 06/01/16: PTA/stent of left distal SFA/popliteal                         arteries;                         06/08/16: PTA of left SFA. Comparison Study: 11/22/2020 Performing Technologist: Charlane Ferretti RT (R)(VS)  Examination Guidelines: A complete evaluation includes at minimum, Doppler waveform signals and systolic blood pressure reading at the level of bilateral brachial, anterior tibial, and posterior tibial arteries, when vessel segments are accessible. Bilateral testing is considered an integral  part of a complete examination. Photoelectric Plethysmograph (PPG) waveforms and toe systolic pressure readings are included as required and additional duplex testing as needed. Limited examinations for reoccurring indications may be performed as noted.  ABI Findings: +---------+------------------+-----+----------+--------+ Right    Rt Pressure (mmHg)IndexWaveform  Comment  +---------+------------------+-----+----------+--------+ Brachial 156                                       +---------+------------------+-----+----------+--------+ ATA      116                    monophasic         +---------+------------------+-----+----------+--------+ PTA      131               0.84 monophasic         +---------+------------------+-----+----------+--------+ Ellen Henri               0.72 Abnormal           +---------+------------------+-----+----------+--------+ +---------+------------------+-----+----------+-------+ Left     Lt Pressure (mmHg)IndexWaveform  Comment +---------+------------------+-----+----------+-------+ Brachial 156                                      +---------+------------------+-----+----------+-------+ ATA      109                    monophasic        +---------+------------------+-----+----------+-------+ PTA      107               0.69 monophasic        +---------+------------------+-----+----------+-------+ Great Toe69                0.44 Abnormal          +---------+------------------+-----+----------+-------+ +-------+-----------+-----------+------------+------------+ ABI/TBIToday's ABIToday's TBIPrevious ABIPrevious TBI +-------+-----------+-----------+------------+------------+ Right  .84        .72        .90         .  81          +-------+-----------+-----------+------------+------------+ Left   .70        .44        .79         .68          +-------+-----------+-----------+------------+------------+ Bilateral ABIs  appear essentially unchanged compared to prior study on 11/22/2020. Bilateral TBIs appear essentially unchanged compared to prior study on 11/22/2020.  Summary: Right: Resting right ankle-brachial index indicates mild right lower extremity arterial disease. The right toe-brachial index is normal. Left: Resting left ankle-brachial index indicates moderate left lower extremity arterial disease. The left toe-brachial index is abnormal. Left TBI sppears slightly decreased as compared to the previous exam on 11/22/2020.  *See table(s) above for measurements and observations.  Electronically signed by [name] MD on 05/30/2021 at 8:51:47 AM.    Final       Assessment and Plan: Patient Active Problem List   Diagnosis Date Noted   SOB (shortness of breath) 05/01/2021   HCAP (healthcare-associated pneumonia) 71/24/5809   Chronic systolic CHF (congestive heart failure) ([place]) 05/01/2021   Acute exacerbation of chronic obstructive pulmonary disease (COPD) ([place]) 04/30/2021   Elevated troponin 04/01/2021   Acute on chronic respiratory failure with hypoxia ([place]) 03/31/2021   Pneumothorax after biopsy 05/03/2020   Pneumothorax on right 05/01/2020   Chest pain on breathing 04/21/2020   Pleuritic chest pain 04/11/2020   Right lower lobe pulmonary nodule 04/02/2020   Iron deficiency anemia 04/02/2020   Pica in adults 04/02/2020   Goals of care, counseling/discussion 03/14/2020   Acute non-recurrent pansinusitis 01/01/2020   Pain in left wrist 09/24/2019   Encounter for general adult medical examination with abnormal findings 06/28/2019   Elevated total protein 01/22/2019   Uncontrolled type 2 diabetes mellitus with hyperglycemia ([place]) 05/16/2018   Chronic obstructive pulmonary disease ([place]) 05/16/2018   Vitamin D deficiency 05/16/2018   Flu vaccine need 05/16/2018   Dysuria 05/16/2018   Cancer of lower lobe of right lung ([place]) 01/21/2018   Screening for breast cancer 10/12/2017   Personal history of tobacco  use, presenting hazards to health 02/03/2017   PAD (peripheral artery disease) ([place]) 06/22/2016   Essential hypertension 05/26/2016   Diabetes ([place]) 05/26/2016   Hyperlipidemia 05/26/2016   Atherosclerosis of native arteries of extremity with intermittent claudication ([place]) 05/26/2016   Uterine leiomyoma 04/30/2016   Elevated CEA 01/31/2016   Special screening for malignant neoplasms, colon    Benign neoplasm of sigmoid colon    Primary lung cancer ([place])    Malignant neoplasm of upper lobe of right lung ([place])     1. Chronic obstructive pulmonary disease, unspecified COPD type ([place]) Had recent exacerbation now doing better.  I did recommend getting a follow-up chest x-ray for the follow-up of her pneumonia.  2. SOB (shortness of breath) She is back to her baseline.  Continue with inhalers recommended therapy  3. Lobar pneumonia, unspecified organism [facility]) She had pneumonia we will get a follow-up chest x-ray and see her back - DG Chest 2 View; Future   General Counseling: I have discussed the findings of the evaluation and examination with [facility].  I have also discussed any further diagnostic evaluation thatmay be needed or ordered today. Gloriann verbalizes understanding of the findings of todays visit. We also reviewed her medications today and discussed drug interactions and side effects including but not limited excessive drowsiness and altered mental states. We also discussed that there is always a risk not just to  her but also people around her. she has been encouraged to call the office with any questions or concerns that should arise related to todays visit.  Orders Placed This Encounter  Procedures   DG Chest 2 View    Standing Status:   Future    Standing Expiration Date:   06/03/2022    Order Specific Question:   Reason for Exam (SYMPTOM  OR DIAGNOSIS REQUIRED)    Answer:   pneumonia\    Order Specific Question:   Preferred imaging location?    Answer:   [facility]   6  minute walk    Standing Status:   Future    Standing Expiration Date:   06/03/2022    Order Specific Question:   Where should this test be performed?    Answer:   other     Time spent: 43  I have personally obtained a history, examined the patient, evaluated laboratory and imaging results, formulated the assessment and plan and placed orders.    Allyne Gee, MD [facility]

## 2021-06-03 NOTE — Telephone Encounter (Signed)
Called and [facility] for [facility] with [place] about inogen machine, asked for her to call us back

## 2021-06-08 ENCOUNTER — Other Ambulatory Visit: Payer: Self-pay | Admitting: Internal Medicine

## 2021-06-10 NOTE — Telephone Encounter (Signed)
error 

## 2021-06-11 ENCOUNTER — Other Ambulatory Visit: Payer: Self-pay

## 2021-06-11 ENCOUNTER — Emergency Department: Payer: Medicare Other

## 2021-06-11 ENCOUNTER — Inpatient Hospital Stay
Admission: EM | Admit: 2021-06-11 | Discharge: 2021-06-14 | DRG: 193 | Disposition: A | Discharge status: Home / Self Care | Payer: Medicare Other | Attending: Internal Medicine | Admitting: Internal Medicine

## 2021-06-11 ENCOUNTER — Encounter: Payer: Self-pay | Admitting: Emergency Medicine

## 2021-06-11 DIAGNOSIS — Z801 Family history of malignant neoplasm of trachea, bronchus and lung: Secondary | ICD-10-CM

## 2021-06-11 DIAGNOSIS — J9621 Acute and chronic respiratory failure with hypoxia: Secondary | ICD-10-CM | POA: Diagnosis present

## 2021-06-11 DIAGNOSIS — M1990 Unspecified osteoarthritis, unspecified site: Secondary | ICD-10-CM | POA: Diagnosis present

## 2021-06-11 DIAGNOSIS — T380X5A Adverse effect of glucocorticoids and synthetic analogues, initial encounter: Secondary | ICD-10-CM | POA: Diagnosis not present

## 2021-06-11 DIAGNOSIS — Z87891 Personal history of nicotine dependence: Secondary | ICD-10-CM

## 2021-06-11 DIAGNOSIS — Z833 Family history of diabetes mellitus: Secondary | ICD-10-CM

## 2021-06-11 DIAGNOSIS — Z7984 Long term (current) use of oral hypoglycemic drugs: Secondary | ICD-10-CM

## 2021-06-11 DIAGNOSIS — Z7951 Long term (current) use of inhaled steroids: Secondary | ICD-10-CM

## 2021-06-11 DIAGNOSIS — I10 Essential (primary) hypertension: Secondary | ICD-10-CM | POA: Diagnosis present

## 2021-06-11 DIAGNOSIS — E1169 Type 2 diabetes mellitus with other specified complication: Secondary | ICD-10-CM | POA: Diagnosis present

## 2021-06-11 DIAGNOSIS — Z20822 Contact with and (suspected) exposure to covid-19: Secondary | ICD-10-CM | POA: Diagnosis present

## 2021-06-11 DIAGNOSIS — J439 Emphysema, unspecified: Secondary | ICD-10-CM | POA: Diagnosis present

## 2021-06-11 DIAGNOSIS — I70219 Atherosclerosis of native arteries of extremities with intermittent claudication, unspecified extremity: Secondary | ICD-10-CM | POA: Diagnosis present

## 2021-06-11 DIAGNOSIS — G4730 Sleep apnea, unspecified: Secondary | ICD-10-CM | POA: Diagnosis present

## 2021-06-11 DIAGNOSIS — J189 Pneumonia, unspecified organism: Secondary | ICD-10-CM | POA: Diagnosis present

## 2021-06-11 DIAGNOSIS — Z923 Personal history of irradiation: Secondary | ICD-10-CM | POA: Diagnosis not present

## 2021-06-11 DIAGNOSIS — Z8249 Family history of ischemic heart disease and other diseases of the circulatory system: Secondary | ICD-10-CM

## 2021-06-11 DIAGNOSIS — Z7902 Long term (current) use of antithrombotics/antiplatelets: Secondary | ICD-10-CM | POA: Diagnosis not present

## 2021-06-11 DIAGNOSIS — Z79899 Other long term (current) drug therapy: Secondary | ICD-10-CM

## 2021-06-11 DIAGNOSIS — E785 Hyperlipidemia, unspecified: Secondary | ICD-10-CM | POA: Diagnosis not present

## 2021-06-11 DIAGNOSIS — Z8349 Family history of other endocrine, nutritional and metabolic diseases: Secondary | ICD-10-CM | POA: Diagnosis not present

## 2021-06-11 DIAGNOSIS — Z794 Long term (current) use of insulin: Secondary | ICD-10-CM

## 2021-06-11 DIAGNOSIS — E1151 Type 2 diabetes mellitus with diabetic peripheral angiopathy without gangrene: Secondary | ICD-10-CM | POA: Diagnosis present

## 2021-06-11 DIAGNOSIS — J441 Chronic obstructive pulmonary disease with (acute) exacerbation: Secondary | ICD-10-CM | POA: Diagnosis present

## 2021-06-11 DIAGNOSIS — E1165 Type 2 diabetes mellitus with hyperglycemia: Secondary | ICD-10-CM

## 2021-06-11 DIAGNOSIS — Z9981 Dependence on supplemental oxygen: Secondary | ICD-10-CM

## 2021-06-11 DIAGNOSIS — E0865 Diabetes mellitus due to underlying condition with hyperglycemia: Secondary | ICD-10-CM

## 2021-06-11 DIAGNOSIS — E0800 Diabetes mellitus due to underlying condition with hyperosmolarity without nonketotic hyperglycemic-hyperosmolar coma (NKHHC): Secondary | ICD-10-CM | POA: Diagnosis not present

## 2021-06-11 DIAGNOSIS — E782 Mixed hyperlipidemia: Secondary | ICD-10-CM | POA: Diagnosis present

## 2021-06-11 DIAGNOSIS — F32A Depression, unspecified: Secondary | ICD-10-CM | POA: Diagnosis present

## 2021-06-11 DIAGNOSIS — I5022 Chronic systolic (congestive) heart failure: Secondary | ICD-10-CM | POA: Diagnosis present

## 2021-06-11 DIAGNOSIS — E7800 Pure hypercholesterolemia, unspecified: Secondary | ICD-10-CM | POA: Diagnosis present

## 2021-06-11 DIAGNOSIS — I110 Hypertensive heart disease with heart failure: Secondary | ICD-10-CM | POA: Diagnosis present

## 2021-06-11 DIAGNOSIS — E7849 Other hyperlipidemia: Secondary | ICD-10-CM | POA: Diagnosis not present

## 2021-06-11 DIAGNOSIS — Z85118 Personal history of other malignant neoplasm of bronchus and lung: Secondary | ICD-10-CM | POA: Diagnosis not present

## 2021-06-11 DIAGNOSIS — J0140 Acute pansinusitis, unspecified: Secondary | ICD-10-CM | POA: Diagnosis present

## 2021-06-11 DIAGNOSIS — I5042 Chronic combined systolic (congestive) and diastolic (congestive) heart failure: Secondary | ICD-10-CM | POA: Diagnosis present

## 2021-06-11 DIAGNOSIS — E119 Type 2 diabetes mellitus without complications: Secondary | ICD-10-CM

## 2021-06-11 LAB — BASIC METABOLIC PANEL
Anion gap: 11 (ref 5–15)
BUN: 16 mg/dL (ref 8–23)
CO2: 25 mmol/L (ref 22–32)
Calcium: 8.6 mg/dL — ABNORMAL LOW (ref 8.9–10.3)
Chloride: 104 mmol/L (ref 98–111)
Creatinine, Ser: 0.82 mg/dL (ref 0.44–1.00)
GFR, Estimated: >60 mL/min (ref >60)
Glucose, Bld: 187 mg/dL — ABNORMAL HIGH (ref 70–99)
Potassium: 4.4 mmol/L (ref 3.5–5.1)
Sodium: 140 mmol/L (ref 135–145)

## 2021-06-11 LAB — CBC WITH DIFFERENTIAL/PLATELET
Abs Immature Granulocytes: 0.01 10*3/uL (ref 0.00–0.07)
Basophils Absolute: 0 10*3/uL (ref 0.0–0.1)
Basophils Relative: 1 %
Eosinophils Absolute: 0.2 10*3/uL (ref 0.0–0.5)
Eosinophils Relative: 4 %
HCT: 41.2 % (ref 36.0–46.0)
Hemoglobin: 12.5 g/dL (ref 12.0–15.0)
Immature Granulocytes: 0 %
Lymphocytes Relative: 41 %
Lymphs Abs: 1.8 10*3/uL (ref 0.7–4.0)
MCH: 29.1 pg (ref 26.0–34.0)
MCHC: 30.3 g/dL (ref 30.0–36.0)
MCV: 96 fL (ref 80.0–100.0)
Monocytes Absolute: 0.6 10*3/uL (ref 0.1–1.0)
Monocytes Relative: 13 %
Neutro Abs: 1.8 10*3/uL (ref 1.7–7.7)
Neutrophils Relative %: 41 %
Platelets: 268 10*3/uL (ref 150–400)
RBC: 4.29 MIL/uL (ref 3.87–5.11)
RDW: 13.8 % (ref 11.5–15.5)
WBC: 4.3 10*3/uL (ref 4.0–10.5)
nRBC: 0 % (ref 0.0–0.2)

## 2021-06-11 LAB — RESP PANEL BY RT-PCR (FLU A&B, COVID) ARPGX2
Influenza A by PCR: NEGATIVE
Influenza B by PCR: NEGATIVE
SARS Coronavirus 2 by RT PCR: NEGATIVE

## 2021-06-11 LAB — PROCALCITONIN: Procalcitonin: <0.1 ng/mL

## 2021-06-11 LAB — CBG MONITORING, ED
Glucose-Capillary: 247 mg/dL — ABNORMAL HIGH (ref 70–99)
Glucose-Capillary: 305 mg/dL — ABNORMAL HIGH (ref 70–99)
Glucose-Capillary: 290 mg/dL — ABNORMAL HIGH (ref 70–99)

## 2021-06-11 MED ORDER — ALPRAZOLAM 0.25 MG PO TABS
0.2500 mg | ORAL_TABLET | Freq: Three times a day (TID) | ORAL | Status: DC | PRN
  Administered 2021-06-12 – 2021-06-14 (×4): 0.25 mg via ORAL
  Filled 2021-06-11 (×4): qty 1

## 2021-06-11 MED ORDER — ENOXAPARIN SODIUM 40 MG/0.4ML IJ SOSY
40.0000 mg | PREFILLED_SYRINGE | INTRAMUSCULAR | Status: DC
  Administered 2021-06-11 – 2021-06-13 (×3): 40 mg via SUBCUTANEOUS
  Filled 2021-06-11 (×3): qty 0.4

## 2021-06-11 MED ORDER — SODIUM CHLORIDE 0.9 % IV SOLN
2.0000 g | Freq: Two times a day (BID) | INTRAVENOUS | Status: DC
  Administered 2021-06-11 – 2021-06-14 (×6): 2 g via INTRAVENOUS
  Filled 2021-06-11 (×9): qty 2

## 2021-06-11 MED ORDER — INSULIN DETEMIR 100 UNIT/ML ~~LOC~~ SOLN
14.0000 [IU] | Freq: Every day | SUBCUTANEOUS | Status: DC
  Administered 2021-06-11 – 2021-06-12 (×2): 14 [IU] via SUBCUTANEOUS
  Filled 2021-06-11 (×4): qty 0.14

## 2021-06-11 MED ORDER — MAGNESIUM SULFATE 2 GM/50ML IV SOLN
2.0000 g | INTRAVENOUS | Status: AC
  Administered 2021-06-11: 2 g via INTRAVENOUS
  Filled 2021-06-11: qty 50

## 2021-06-11 MED ORDER — MELOXICAM 7.5 MG PO TABS
7.5000 mg | ORAL_TABLET | Freq: Two times a day (BID) | ORAL | Status: DC | PRN
  Administered 2021-06-11: 7.5 mg via ORAL
  Filled 2021-06-11 (×3): qty 1

## 2021-06-11 MED ORDER — METHYLPREDNISOLONE SODIUM SUCC 125 MG IJ SOLR: 60.0000 mg | INTRAMUSCULAR | Status: DC

## 2021-06-11 MED ORDER — INSULIN ASPART 100 UNIT/ML IJ SOLN
0.0000 [IU] | Freq: Three times a day (TID) | INTRAMUSCULAR | Status: DC
  Administered 2021-06-12: 4 [IU] via SUBCUTANEOUS
  Administered 2021-06-12: 7 [IU] via SUBCUTANEOUS
  Administered 2021-06-13: 11 [IU] via SUBCUTANEOUS
  Administered 2021-06-13: 3 [IU] via SUBCUTANEOUS
  Administered 2021-06-14: 4 [IU] via SUBCUTANEOUS
  Filled 2021-06-11 (×5): qty 1

## 2021-06-11 MED ORDER — INSULIN ASPART 100 UNIT/ML IJ SOLN
0.0000 [IU] | Freq: Every day | INTRAMUSCULAR | Status: DC
  Administered 2021-06-11: 3 [IU] via SUBCUTANEOUS
  Filled 2021-06-11 (×2): qty 1

## 2021-06-11 MED ORDER — METHYLPREDNISOLONE SODIUM SUCC 125 MG IJ SOLR
60.0000 mg | INTRAMUSCULAR | Status: DC
  Administered 2021-06-12 – 2021-06-14 (×3): 60 mg via INTRAVENOUS
  Filled 2021-06-11 (×3): qty 2

## 2021-06-11 MED ORDER — OMEGA-3-ACID ETHYL ESTERS 1 G PO CAPS
1.0000 g | ORAL_CAPSULE | Freq: Every day | ORAL | Status: DC
  Administered 2021-06-12 – 2021-06-14 (×3): 1 g via ORAL
  Filled 2021-06-11 (×3): qty 1

## 2021-06-11 MED ORDER — VANCOMYCIN HCL IN DEXTROSE 1-5 GM/200ML-% IV SOLN: 1000.0000 mg | Freq: Two times a day (BID) | INTRAVENOUS | Status: DC

## 2021-06-11 MED ORDER — VANCOMYCIN HCL 750 MG/150ML IV SOLN
750.0000 mg | INTRAVENOUS | Status: DC
  Administered 2021-06-12: 750 mg via INTRAVENOUS
  Filled 2021-06-11 (×2): qty 150

## 2021-06-11 MED ORDER — ALBUTEROL SULFATE (2.5 MG/3ML) 0.083% IN NEBU
5.0000 mg | INHALATION_SOLUTION | RESPIRATORY_TRACT | Status: DC | PRN
  Administered 2021-06-12: 5 mg via RESPIRATORY_TRACT
  Filled 2021-06-11: qty 6

## 2021-06-11 MED ORDER — CLOPIDOGREL BISULFATE 75 MG PO TABS
75.0000 mg | ORAL_TABLET | Freq: Every day | ORAL | Status: DC
  Administered 2021-06-12 – 2021-06-14 (×3): 75 mg via ORAL
  Filled 2021-06-11 (×3): qty 1

## 2021-06-11 MED ORDER — SODIUM CHLORIDE 0.9 % IV SOLN
2.0000 g | Freq: Once | INTRAVENOUS | Status: AC
  Administered 2021-06-11: 2 g via INTRAVENOUS
  Filled 2021-06-11: qty 2

## 2021-06-11 MED ORDER — FLUTICASONE FUROATE-VILANTEROL 100-25 MCG/ACT IN AEPB
1.0000 | INHALATION_SPRAY | Freq: Every day | RESPIRATORY_TRACT | Status: DC
  Administered 2021-06-11 – 2021-06-14 (×3): 1 via RESPIRATORY_TRACT
  Filled 2021-06-11: qty 28

## 2021-06-11 MED ORDER — LISINOPRIL 10 MG PO TABS
10.0000 mg | ORAL_TABLET | Freq: Every day | ORAL | Status: DC
  Administered 2021-06-12 – 2021-06-14 (×3): 10 mg via ORAL
  Filled 2021-06-11 (×3): qty 1

## 2021-06-11 MED ORDER — IPRATROPIUM-ALBUTEROL 0.5-2.5 (3) MG/3ML IN SOLN
3.0000 mL | Freq: Four times a day (QID) | RESPIRATORY_TRACT | Status: DC
  Administered 2021-06-11 – 2021-06-13 (×9): 3 mL via RESPIRATORY_TRACT
  Filled 2021-06-11 (×9): qty 3

## 2021-06-11 MED ORDER — FERROUS SULFATE 325 (65 FE) MG PO TABS
324.0000 mg | ORAL_TABLET | Freq: Two times a day (BID) | ORAL | Status: DC
  Administered 2021-06-12 – 2021-06-14 (×5): 324 mg via ORAL
  Filled 2021-06-11 (×5): qty 1

## 2021-06-11 MED ORDER — GABAPENTIN 100 MG PO CAPS
100.0000 mg | ORAL_CAPSULE | Freq: Two times a day (BID) | ORAL | Status: DC
  Administered 2021-06-11 – 2021-06-14 (×6): 100 mg via ORAL
  Filled 2021-06-11 (×6): qty 1

## 2021-06-11 MED ORDER — INSULIN ASPART 100 UNIT/ML IJ SOLN
6.0000 [IU] | Freq: Three times a day (TID) | INTRAMUSCULAR | Status: DC
  Administered 2021-06-13 – 2021-06-14 (×3): 6 [IU] via SUBCUTANEOUS
  Filled 2021-06-11 (×3): qty 1

## 2021-06-11 MED ORDER — VANCOMYCIN HCL IN DEXTROSE 1-5 GM/200ML-% IV SOLN
1000.0000 mg | Freq: Once | INTRAVENOUS | Status: AC
  Administered 2021-06-11: 1000 mg via INTRAVENOUS
  Filled 2021-06-11: qty 200

## 2021-06-11 MED ORDER — METFORMIN HCL 500 MG PO TABS
500.0000 mg | ORAL_TABLET | Freq: Every day | ORAL | Status: DC
  Administered 2021-06-12 – 2021-06-14 (×3): 500 mg via ORAL
  Filled 2021-06-11 (×3): qty 1

## 2021-06-11 MED ORDER — CARVEDILOL 6.25 MG PO TABS
3.1250 mg | ORAL_TABLET | Freq: Two times a day (BID) | ORAL | Status: DC
  Administered 2021-06-12 – 2021-06-14 (×5): 3.125 mg via ORAL
  Filled 2021-06-11 (×5): qty 1

## 2021-06-11 MED ORDER — ESCITALOPRAM OXALATE 10 MG PO TABS
10.0000 mg | ORAL_TABLET | Freq: Every day | ORAL | Status: DC
  Administered 2021-06-12 – 2021-06-14 (×3): 10 mg via ORAL
  Filled 2021-06-11 (×3): qty 1

## 2021-06-11 MED ORDER — POTASSIUM CHLORIDE CRYS ER 10 MEQ PO TBCR
10.0000 meq | EXTENDED_RELEASE_TABLET | ORAL | Status: DC
  Administered 2021-06-12 – 2021-06-14 (×2): 10 meq via ORAL
  Filled 2021-06-11 (×2): qty 1

## 2021-06-11 MED ORDER — ALBUTEROL SULFATE (2.5 MG/3ML) 0.083% IN NEBU
5.0000 mg | INHALATION_SOLUTION | Freq: Once | RESPIRATORY_TRACT | Status: AC
  Administered 2021-06-11: 5 mg via RESPIRATORY_TRACT
  Filled 2021-06-11: qty 6

## 2021-06-11 NOTE — Assessment & Plan Note (Signed)
Type II. The patient is on levemir 8-12 units daily at home. Due to steroids I have increased her daily dose to 14 units daily. This will be accompanied by SSI with FSBS and prandial insulin.

## 2021-06-11 NOTE — Assessment & Plan Note (Signed)
Present on CT head. Possibly playing a role in her exacerbation of COPD. Patient is receiving IV cefepime. She will need to be discharged to home on oral antibiotics to complete the course.

## 2021-06-11 NOTE — Assessment & Plan Note (Signed)
Echocardiogram in 03/31/2021 has demonstrated EF of 45-50% with global hypokinesis. Monitor volume status. Appears euvolemic at this time.

## 2021-06-11 NOTE — Assessment & Plan Note (Addendum)
Pt presents with a complaint of progressively worsening shortness of breath beginning this morning at 10:30. She is currently requiring 4L O2. At baseline the patient requires 2L O2 by [place] to maintain saturations in the 90's. When the patient got up to go to the bathroom with O2 on her saturations dropped to 73% and her heart rate increased to 130. It returned to normal when she got back in bed with 4L O2. She denies purulent cough, fevers, chills, but she does have some chest pain with deep respirations. She is receiving prn albuterol, duonebs, IV solumedrol, IV cefepime and vancomycin, and Breo ellipta. The patient was recently started on Treledgy at home, but she has not been taking it. She blames it for her recent worsening of her respiratory status.

## 2021-06-11 NOTE — ED Notes (Signed)
Pt ambulated to BR with steady gait. Pt tolerated poorly; pt became tachycardic to 130s, tachypneic, and hypoxic down to 73%. Pt assisted back to bed. Pt O2 increased to 4 LPM. Pt recovered to 96% on 4L after several minutes. Purewick placed on pt for further restroom needs. Pt verbalizes understanding.

## 2021-06-11 NOTE — Consult Note (Signed)
PHARMACY -  BRIEF ANTIBIOTIC NOTE   Pharmacy has received consult(s) for cefepime and Vancomycin from an ED provider.  The patient's profile has been reviewed for ht/wt/allergies/indication/available labs.    One time order(s) placed for  --cefepime 2 gram --Vancomycin 1000 mg   Further antibiotics/pharmacy consults should be ordered by admitting physician if indicated.                       Thank you, Dorothe Pea, PharmD, BCPS Clinical Pharmacist   06/11/2021  3:41 PM

## 2021-06-11 NOTE — ED Notes (Signed)
MD notified of CBG 247 prior to eating dinner.

## 2021-06-11 NOTE — ED Triage Notes (Signed)
Arrives via [facility].  C/O SOB x 45 minutes.  Per report,after bathing, c/o difficulty breathing.  AFIB, lungs diminished with wheezing.  20g L hand, 125 mg solumedrol and 2 duo neb given by EMS PTA.  Initial sats 74% on RA, with neb, sats 97-98%.

## 2021-06-11 NOTE — Assessment & Plan Note (Signed)
The patient has been continued on Coreg and lisinopril as at home. Monitor blood pressure.

## 2021-06-11 NOTE — Assessment & Plan Note (Signed)
Baseline oxygen requirements are 2L by [place] continuous at home. She is currently requiring 4L by [place] continuous at rest to maintain saturations in the 90's so far. Will wean as possible.

## 2021-06-11 NOTE — ED Notes (Signed)
Pt alert w/ NADN. Family at bedside. Pt updated on care plan

## 2021-06-11 NOTE — Consult Note (Signed)
Pharmacy Antibiotic Note  Angela Jensen is a 65 y.o. female admitted on 06/11/2021 with pneumonia.  Pharmacy has been consulted for Vancomycin dosing. Patient is also on Cefepime(dose renally adjusted)  Plan: Vancomycin 750 mg IV Q 24 hrs. Goal AUC 400-550. Expected AUC: 461.8 Expected Css: 11.6 SCr used: 0.82   Height: 4' 10.5" (148.6 cm) Weight: 53.6 kg (118 lb 2.7 oz) IBW/kg (Calculated) : 42.05  Temp (24hrs), Avg:98.5 F (36.9 C), Min:98.5 F (36.9 C), Max:98.5 F (36.9 C)  Recent Labs  Lab 06/11/21 1344  WBC 4.3  CREATININE 0.82    Estimated Creatinine Clearance: 50.4 mL/min (by C-G formula based on SCr of 0.82 mg/dL).    No Known Allergies  Antimicrobials this admission: Vancomycin 10/26 >>  Cefepime 10/26 >>    Microbiology results: 10/16 MRSA PCR: pending  Thank you for allowing pharmacy to be a part of this patient's care.  Angela Jensen 06/11/2021 6:47 PM

## 2021-06-11 NOTE — H&P (Signed)
Angela Jensen is an 65 y.o. female.   Chief Complaint: Shortness of breath. HPI: The patient is a 65 yr old woman who states that she began having shortness of breath at 10:30 this am. She denies fevers, chills, production of purulent sputum, or cough. She does have chest tightness with deep respirations.   She has a past medical history significant for Cancer of the Right Lung, Chronic hypoxic respiratory failure, HFREF, COPD, DM II, Hypertension, Hyperlipidemia, and PAD.   In the ED she was found to require 4L to maintain saturations in the 90's. Baseline O2 requirements at home are 2L by [place]. CXR demonstrated Emphysema, and possible multifocal pneumonia.   TRH was consulted to admit the patient for further evaluation and treatment.  Past Medical History:  Diagnosis Date   Anemia    Arthritis    Asthma    Atherosclerosis of native arteries of extremity with intermittent claudication ([place]) 05/26/2016   Cancer ([place]) 2012   Right Lung CA   COPD (chronic obstructive pulmonary disease) ([place])    Depression    Diabetes mellitus without complication ([place])    Patient takes Janumet   Essential hypertension 05/26/2016   Hypercholesteremia    Oxygen dependent    2L at nite    PAD (peripheral artery disease) ([place]) 06/22/2016   Peripheral vascular disease (HCC)    Personal history of radiation therapy    Shortness of breath dyspnea    with exertion    Sialolithiasis    Sleep apnea    Wears dentures    full upper and lower    Past Surgical History:  Procedure Laterality Date   CESAREAN SECTION     x3   COLONOSCOPY WITH PROPOFOL N/A 06/25/2015   Procedure: COLONOSCOPY WITH PROPOFOL;  Surgeon: Lucilla Lame, MD;  Location: [facility];  Service: Endoscopy;  Laterality: N/A;   COLONOSCOPY WITH PROPOFOL N/A 07/26/2020   Procedure: COLONOSCOPY WITH PROPOFOL;  Surgeon: Lucilla Lame, MD;  Location: [place];  Service: Endoscopy;  Laterality: N/A;   CYST REMOVAL LEG     and on  shoulder    ESOPHAGOGASTRODUODENOSCOPY (EGD) WITH PROPOFOL N/A 07/26/2020   Procedure: ESOPHAGOGASTRODUODENOSCOPY (EGD) WITH PROPOFOL;  Surgeon: Lucilla Lame, MD;  Location: [place];  Service: Endoscopy;  Laterality: N/A;  Diabetic - oral meds   LOWER EXTREMITY ANGIOGRAPHY Left 09/29/2018   Procedure: LOWER EXTREMITY ANGIOGRAPHY;  Surgeon: Algernon Huxley, MD;  Location: [place] [facility];  Service: Cardiovascular;  Laterality: Left;   LUNG BIOPSY  05 15 2013   has lung "spots"   PERIPHERAL VASCULAR CATHETERIZATION Left 06/01/2016   Procedure: Lower Extremity Angiography;  Surgeon: Algernon Huxley, MD;  Location: [place] [facility];  Service: Cardiovascular;  Laterality: Left;   PERIPHERAL VASCULAR CATHETERIZATION N/A 06/01/2016   Procedure: Abdominal Aortogram w/Lower Extremity;  Surgeon: Algernon Huxley, MD;  Location: [place] [facility];  Service: Cardiovascular;  Laterality: N/A;   PERIPHERAL VASCULAR CATHETERIZATION  06/01/2016   Procedure: Lower Extremity Intervention;  Surgeon: Algernon Huxley, MD;  Location: [place];  Service: Cardiovascular;;   PERIPHERAL VASCULAR CATHETERIZATION Right 06/08/2016   Procedure: Lower Extremity Angiography;  Surgeon: Algernon Huxley, MD;  Location: [place] [facility];  Service: Cardiovascular;  Laterality: Right;   PERIPHERAL VASCULAR CATHETERIZATION  06/08/2016   Procedure: Lower Extremity Intervention;  Surgeon: Algernon Huxley, MD;  Location: [facility];  Service: Cardiovascular;;   SUBMANDIBULAR GLAND EXCISION Left 12/06/2020  Procedure: EXCISION SUBMANDIBULAR GLAND;  Surgeon: Beverly Gust, MD;  Location: [place];  Service: ENT;  Laterality: Left;  needs to be first case Diabetic - diet controlled    Family History  Problem Relation Age of Onset   Lung cancer Father    Diabetes Mother    Hypercholesterolemia Mother    Diabetes Sister    Diabetes Maternal Grandmother    Diabetes Paternal Grandmother     Diabetes Sister    Heart attack Brother    Coronary artery disease Brother    Vascular Disease Brother    Hypertension Sister    Heart attack Brother    Social History:  reports that she quit smoking about 11 years ago. Her smoking use included cigarettes. She has a 37.00 pack-year smoking history. She has quit using smokeless tobacco.  Her smokeless tobacco use included snuff. She reports that she does not currently use alcohol after a past usage of about 5.0 standard drinks per week. She reports that she does not currently use drugs after having used the following drugs: Marijuana, "Crack" cocaine, and Cocaine. (Not in a hospital admission)   Allergies: No Known Allergies  A comprehensive review of systems was negative except for: those elements included in the HPI above.   General appearance: alert, cooperative, appears stated age, and mild distress Head: Normocephalic, without obvious abnormality, atraumatic Eyes: conjunctivae/corneas clear. PERRL, EOM's intact. Fundi benign. Throat: lips, mucosa, and tongue normal; teeth and gums normal Neck: no adenopathy, no carotid bruit, no JVD, supple, symmetrical, trachea midline, and thyroid not enlarged, symmetric, no tenderness/mass/nodules Resp: diminished breath sounds bilaterally Chest wall: no tenderness Cardio: regular rate and rhythm, S1, S2 normal, no murmur, click, rub or gallop GI: soft, non-tender; bowel sounds normal; no masses,  no organomegalyNo hernias or distention. Extremities: extremities normal, atraumatic, no cyanosis or edema Pulses: 2+ and symmetric Skin: Skin color, texture, turgor normal. No rashes or lesions Lymph nodes: Cervical, supraclavicular, and axillary nodes normal. Neurologic: Alert and oriented X 3, normal strength and tone. Normal symmetric reflexes. Normal coordination and gait Incision/Wound: None noted.  Results for orders placed or performed during the hospital encounter of 06/11/21 (from the past 48  hour(s))  Basic metabolic panel     Status: Abnormal   Collection Time: 06/11/21  1:44 PM  Result Value Ref Range   Sodium 140 135 - 145 mmol/L   Potassium 4.4 3.5 - 5.1 mmol/L   Chloride 104 98 - 111 mmol/L   CO2 25 22 - 32 mmol/L   Glucose, Bld 187 (H) 70 - 99 mg/dL    Comment: Glucose reference range applies only to samples taken after fasting for at least 8 hours.   BUN 16 8 - 23 mg/dL   Creatinine, Ser 0.82 0.44 - 1.00 mg/dL   Calcium 8.6 (L) 8.9 - 10.3 mg/dL   GFR, Estimated >60 >60 mL/min    Comment: (NOTE) Calculated using the CKD-EPI Creatinine Equation (2021)    Anion gap 11 5 - 15    Comment: Performed at [facility], [address] [place], [place] [address]  CBC with Differential     Status: None   Collection Time: 06/11/21  1:44 PM  Result Value Ref Range   WBC 4.3 4.0 - 10.5 K/uL   RBC 4.29 3.87 - 5.11 MIL/uL   Hemoglobin 12.5 12.0 - 15.0 g/dL   HCT 41.2 36.0 - 46.0 %   MCV 96.0 80.0 - 100.0 fL   MCH 29.1 26.0 -  34.0 pg   MCHC 30.3 30.0 - 36.0 g/dL   RDW 13.8 11.5 - 15.5 %   Platelets 268 150 - 400 K/uL   nRBC 0.0 0.0 - 0.2 %   Neutrophils Relative % 41 %   Neutro Abs 1.8 1.7 - 7.7 K/uL   Lymphocytes Relative 41 %   Lymphs Abs 1.8 0.7 - 4.0 K/uL   Monocytes Relative 13 %   Monocytes Absolute 0.6 0.1 - 1.0 K/uL   Eosinophils Relative 4 %   Eosinophils Absolute 0.2 0.0 - 0.5 K/uL   Basophils Relative 1 %   Basophils Absolute 0.0 0.0 - 0.1 K/uL   Immature Granulocytes 0 %   Abs Immature Granulocytes 0.01 0.00 - 0.07 K/uL    Comment: Performed at [facility], [address], [place], [place] [address]  Resp Panel by RT-PCR (Flu A&B, Covid) Nasopharyngeal Swab     Status: None   Collection Time: 06/11/21  1:44 PM   Specimen: Nasopharyngeal Swab; Nasopharyngeal(NP) swabs in vial transport medium  Result Value Ref Range   SARS Coronavirus 2 by RT PCR NEGATIVE NEGATIVE    Comment: (NOTE) SARS-CoV-2 target nucleic acids are NOT  DETECTED.  The SARS-CoV-2 RNA is generally detectable in upper respiratory specimens during the acute phase of infection. The lowest concentration of SARS-CoV-2 viral copies this assay can detect is 138 copies/mL. A negative result does not preclude SARS-Cov-2 infection and should not be used as the sole basis for treatment or other patient management decisions. A negative result may occur with  improper specimen collection/handling, submission of specimen other than nasopharyngeal swab, presence of viral mutation(s) within the areas targeted by this assay, and inadequate number of viral copies(<138 copies/mL). A negative result must be combined with clinical observations, patient history, and epidemiological information. The expected result is Negative.  Fact Sheet for Patients:  EntrepreneurPulse.com.au  Fact Sheet for Healthcare Providers:  IncredibleEmployment.be  This test is no t yet approved or cleared by the [place] FDA and  has been authorized for detection and/or diagnosis of SARS-CoV-2 by FDA under an Emergency Use Authorization (EUA). This EUA will remain  in effect (meaning this test can be used) for the duration of the COVID-19 declaration under Section 564(b)(1) of the Act, 21 U.S.C.section 360bbb-3(b)(1), unless the authorization is terminated  or revoked sooner.       Influenza A by PCR NEGATIVE NEGATIVE   Influenza B by PCR NEGATIVE NEGATIVE    Comment: (NOTE) The Xpert Xpress SARS-CoV-2/FLU/RSV plus assay is intended as an aid in the diagnosis of influenza from Nasopharyngeal swab specimens and should not be used as a sole basis for treatment. Nasal washings and aspirates are unacceptable for Xpert Xpress SARS-CoV-2/FLU/RSV testing.  Fact Sheet for Patients: EntrepreneurPulse.com.au  Fact Sheet for Healthcare Providers: IncredibleEmployment.be  This test is not yet approved or  cleared by the [place] FDA and has been authorized for detection and/or diagnosis of SARS-CoV-2 by FDA under an Emergency Use Authorization (EUA). This EUA will remain in effect (meaning this test can be used) for the duration of the COVID-19 declaration under Section 564(b)(1) of the Act, 21 U.S.C. section 360bbb-3(b)(1), unless the authorization is terminated or revoked.  Performed at [facility], [address] [place], [place] [address]   CBG monitoring, ED     Status: Abnormal   Collection Time: 06/11/21  6:05 PM  Result Value Ref Range   Glucose-Capillary 247 (H) 70 - 99 mg/dL    Comment:  Glucose reference range applies only to samples taken after fasting for at least 8 hours.   @[id number] @  Blood pressure 116/79, pulse 93, temperature 98.5 F (36.9 C), temperature source Oral, resp. rate 16, height 4' 10.5" (1.486 m), weight 53.6 kg, SpO2 96 %.    Assessment/Plan Acute exacerbation of chronic obstructive pulmonary disease (COPD) ([place]) Pt presents with a complaint of progressively worsening shortness of breath beginning this morning at 10:30. She is currently requiring 4L O2. At baseline the patient requires 2L O2 by [place] to maintain saturations in the 90's. When the patient got up to go to the bathroom with O2 on her saturations dropped to 73% and her heart rate increased to 130. It returned to normal when she got back in bed with 4L O2. She denies purulent cough, fevers, chills, but she does have some chest pain with deep respirations. She is receiving prn albuterol, duonebs, IV solumedrol, IV cefepime and vancomycin, and Breo ellipta. The patient was recently started on Treledgy at home, but she has not been taking it. She blames it for her recent worsening of her respiratory status.  Acute non-recurrent pansinusitis Present on CT head. Possibly playing a role in her exacerbation of COPD. Patient is receiving IV cefepime. She will need to be discharged to home on  oral antibiotics to complete the course.  Acute on chronic respiratory failure with hypoxia (HCC) Baseline oxygen requirements are 2L by [place] continuous at home. She is currently requiring 4L by [place] continuous at rest to maintain saturations in the 90's so far. Will wean as possible.  Essential hypertension The patient has been continued on Coreg and lisinopril as at home. Monitor blood pressure.  Chronic systolic CHF (congestive heart failure) (HCC) Echocardiogram in 03/31/2021 has demonstrated EF of 45-50% with global hypokinesis. Monitor volume status. Appears euvolemic at this time.  Diabetes ([place]) Type II. The patient is on levemir 8-12 units daily at home. Due to steroids I have increased her daily dose to 14 units daily. This will be accompanied by SSI with FSBS and prandial insulin.  I have seen and examined this patient myself. I have spent 74 minutes in her evaluation and care.  Lynsi Dooner 06/11/2021, 6:33 PM

## 2021-06-11 NOTE — ED Provider Notes (Addendum)
[facility] Emergency Department Provider Note  ____________________________________________  Time seen: Approximately 1:32 PM  I have reviewed the triage vital signs and the nursing notes.   HISTORY  Chief Complaint Shortness of Breath    HPI Angela Jensen is a 65 y.o. female with a history of COPD, hypertension, diabetes, on 2 L nasal cannula oxygen at home who comes ED complaining of shortness of breath that started at about 11:30 AM today.  Constant, severe, no aggravating or alleviating factors.  Denies chest pain.  Received 125 mg IV Solu-Medrol and and 2 duo nebs by EMS without improvement.    Past Medical History:  Diagnosis Date   Anemia    Arthritis    Asthma    Atherosclerosis of native arteries of extremity with intermittent claudication ([place]) 05/26/2016   Cancer ([place]) 2012   Right Lung CA   COPD (chronic obstructive pulmonary disease) ([place])    Depression    Diabetes mellitus without complication ([place])    Patient takes Janumet   Essential hypertension 05/26/2016   Hypercholesteremia    Oxygen dependent    2L at nite    PAD (peripheral artery disease) ([place]) 06/22/2016   Peripheral vascular disease ([place])    Personal history of radiation therapy    Shortness of breath dyspnea    with exertion    Sialolithiasis    Sleep apnea    Wears dentures    full upper and lower     Patient Active Problem List   Diagnosis Date Noted   SOB (shortness of breath) 05/01/2021   HCAP (healthcare-associated pneumonia) 72/53/6644   Chronic systolic CHF (congestive heart failure) ([place]) 05/01/2021   Acute exacerbation of chronic obstructive pulmonary disease (COPD) ([place]) 04/30/2021   Elevated troponin 04/01/2021   Acute on chronic respiratory failure with hypoxia ([place]) 03/31/2021   Pneumothorax after biopsy 05/03/2020   Pneumothorax on right 05/01/2020   Chest pain on breathing 04/21/2020   Pleuritic chest pain 04/11/2020   Right lower lobe  pulmonary nodule 04/02/2020   Iron deficiency anemia 04/02/2020   Pica in adults 04/02/2020   Goals of care, counseling/discussion 03/14/2020   Acute non-recurrent pansinusitis 01/01/2020   Pain in left wrist 09/24/2019   Encounter for general adult medical examination with abnormal findings 06/28/2019   Elevated total protein 01/22/2019   Uncontrolled type 2 diabetes mellitus with hyperglycemia ([place]) 05/16/2018   Chronic obstructive pulmonary disease ([place]) 05/16/2018   Vitamin D deficiency 05/16/2018   Flu vaccine need 05/16/2018   Dysuria 05/16/2018   Cancer of lower lobe of right lung ([place]) 01/21/2018   Screening for breast cancer 10/12/2017   Personal history of tobacco use, presenting hazards to health 02/03/2017   PAD (peripheral artery disease) ([place]) 06/22/2016   Essential hypertension 05/26/2016   Diabetes ([place]) 05/26/2016   Hyperlipidemia 05/26/2016   Atherosclerosis of native arteries of extremity with intermittent claudication ([place]) 05/26/2016   Uterine leiomyoma 04/30/2016   Elevated CEA 01/31/2016   Special screening for malignant neoplasms, colon    Benign neoplasm of sigmoid colon    Primary lung cancer ([place])    Malignant neoplasm of upper lobe of right lung [facility])      Past Surgical History:  Procedure Laterality Date   CESAREAN SECTION     x3   COLONOSCOPY WITH PROPOFOL N/A 06/25/2015   Procedure: COLONOSCOPY WITH PROPOFOL;  Surgeon: Lucilla Lame, MD;  Location: [facility] ENDOSCOPY;  Service: Endoscopy;  Laterality: N/A;   COLONOSCOPY WITH PROPOFOL N/A 07/26/2020  Procedure: COLONOSCOPY WITH PROPOFOL;  Surgeon: Lucilla Lame, MD;  Location: [place];  Service: Endoscopy;  Laterality: N/A;   CYST REMOVAL LEG     and on shoulder    ESOPHAGOGASTRODUODENOSCOPY (EGD) WITH PROPOFOL N/A 07/26/2020   Procedure: ESOPHAGOGASTRODUODENOSCOPY (EGD) WITH PROPOFOL;  Surgeon: Lucilla Lame, MD;  Location: [place];  Service: Endoscopy;  Laterality: N/A;   Diabetic - oral meds   LOWER EXTREMITY ANGIOGRAPHY Left 09/29/2018   Procedure: LOWER EXTREMITY ANGIOGRAPHY;  Surgeon: Algernon Huxley, MD;  Location: [place] [facility];  Service: Cardiovascular;  Laterality: Left;   LUNG BIOPSY  05 15 2013   has lung "spots"   PERIPHERAL VASCULAR CATHETERIZATION Left 06/01/2016   Procedure: Lower Extremity Angiography;  Surgeon: Algernon Huxley, MD;  Location: [place] [facility];  Service: Cardiovascular;  Laterality: Left;   PERIPHERAL VASCULAR CATHETERIZATION N/A 06/01/2016   Procedure: Abdominal Aortogram w/Lower Extremity;  Surgeon: Algernon Huxley, MD;  Location: [place] [facility];  Service: Cardiovascular;  Laterality: N/A;   PERIPHERAL VASCULAR CATHETERIZATION  06/01/2016   Procedure: Lower Extremity Intervention;  Surgeon: Algernon Huxley, MD;  Location: [place] [facility];  Service: Cardiovascular;;   PERIPHERAL VASCULAR CATHETERIZATION Right 06/08/2016   Procedure: Lower Extremity Angiography;  Surgeon: Algernon Huxley, MD;  Location: [place] [facility];  Service: Cardiovascular;  Laterality: Right;   PERIPHERAL VASCULAR CATHETERIZATION  06/08/2016   Procedure: Lower Extremity Intervention;  Surgeon: Algernon Huxley, MD;  Location: [name] [facility];  Service: Cardiovascular;;   SUBMANDIBULAR GLAND EXCISION Left 12/06/2020   Procedure: EXCISION SUBMANDIBULAR GLAND;  Surgeon: Beverly Gust, MD;  Location: [place];  Service: ENT;  Laterality: Left;  needs to be first case Diabetic - diet controlled     Prior to Admission medications   Medication Sig Start Date End Date Taking? Authorizing Provider  Accu-Chek Softclix Lancets lancets Use as instructed for twice a daily Dx E11.65 04/11/21   [name], MD  albuterol (PROVENTIL) (2.5 MG/3ML) 0.083% nebulizer solution USE 1 VIAL IN NEBULIZER EVERY 6 HOURS AS NEEDED FOR WHEEZING 04/29/21   [name], MD  albuterol (VENTOLIN HFA) 108 (90 Base) MCG/ACT inhaler Inhale 2 puffs into the lungs every  6 (six) hours as needed for wheezing or shortness of breath. 12/11/20   [name], MD  ALPRAZolam Duanne Moron) 0.25 MG tablet Take 1 tablet (0.25 mg total) by mouth 3 (three) times daily as needed for anxiety or sleep. 05/08/21   Nicole Kindred A, DO  Blood Glucose Monitoring Suppl (ACCU-CHEK GUIDE) w/Device KIT Use as directed Dx e11.65 04/11/21   [name], MD  carvedilol (COREG) 3.125 MG tablet Take 1 tablet (3.125 mg total) by mouth 2 (two) times daily with a meal. 05/08/21   Nicole Kindred A, DO  clopidogrel (PLAVIX) 75 MG tablet Take 1 tablet (75 mg total) by mouth daily. 12/24/20   [name], MD  escitalopram (LEXAPRO) 10 MG tablet Take 1 tablet (10 mg total) by mouth daily. 05/08/21   Nicole Kindred A, DO  ferrous sulfate 27 MG TABS Take 12 tablets (324 mg total) by mouth 2 (two) times daily with a meal. 05/08/21   Nicole Kindred A, DO  fluticasone (FLONASE) 50 MCG/ACT nasal spray Place 2 sprays into both nostrils daily. 05/09/21   Ezekiel Slocumb, DO  Fluticasone-Umeclidin-Vilant (TRELEGY ELLIPTA) 100-62.5-25 MCG/INH AEPB Inhale 1 puff into the lungs daily. 03/10/21   Jonetta Osgood, NP  gabapentin (  NEURONTIN) 100 MG capsule Take 1 capsule (100 mg total) by mouth 2 (two) times daily. 04/08/21   Jonetta Osgood, NP  glucose blood (ACCU-CHEK GUIDE) test strip USE  STRIP TO CHECK GLUCOSE TWICE DAILY 06/08/21   [name], MD  insulin detemir (LEVEMIR FLEXTOUCH) 100 UNIT/ML FlexPen Inject 8- 12 units with supper qd// with needles 04/22/21   [name], MD  lisinopril (ZESTRIL) 10 MG tablet Take 1 tablet (10 mg total) by mouth daily. 05/09/21   Ezekiel Slocumb, DO  meloxicam (MOBIC) 7.5 MG tablet Take 1 tablet (7.5 mg total) by mouth 2 (two) times daily as needed for pain. Home med. 04/08/21   Jonetta Osgood, NP  metFORMIN (GLUCOPHAGE) 500 MG tablet Take 1 tablet (500 mg total) by mouth daily with breakfast. 03/12/21   [name], MD  Omega-3 Fatty Acids (FISH OIL) 1000 MG CAPS  Take 1 capsule by mouth daily.    [provider]  OXYGEN Inhale into the lungs.    [provider]  potassium chloride (KLOR-CON) 10 MEQ tablet TAKE 1 TABLET BY MOUTH EVERY OTHER DAY 04/29/21   [name], MD     Allergies Patient has no known allergies.   Family History  Problem Relation Age of Onset   Lung cancer Father    Diabetes Mother    Hypercholesterolemia Mother    Diabetes Sister    Diabetes Maternal Grandmother    Diabetes Paternal Grandmother    Diabetes Sister    Heart attack Brother    Coronary artery disease Brother    Vascular Disease Brother    Hypertension Sister    Heart attack Brother     Social History Social History   Tobacco Use   Smoking status: Former    Packs/day: 1.00    Years: 37.00    Pack years: 37.00    Types: Cigarettes    Quit date: 02/06/2010    Years since quitting: 11.3   Smokeless tobacco: Former    Types: Snuff  Vaping Use   Vaping Use: Never used  Substance Use Topics   Alcohol use: Not Currently    Alcohol/week: 5.0 standard drinks    Types: 5 Cans of beer per week    Comment: /h x of alcohol abuse -stopped 2012- now drinks 5 beer per week   Drug use: Not Currently    Types: Marijuana, "Crack" cocaine, Cocaine    Comment: hx of cocaine use- last use 2015; last use marijuana6/22/19,    Review of Systems  Constitutional:   No fever or chills.  ENT:   No sore throat. No rhinorrhea. Cardiovascular:   No chest pain or syncope. Respiratory:   Positive shortness of breath without cough. Gastrointestinal:   Negative for abdominal pain, vomiting and diarrhea.  Musculoskeletal:   Negative for focal pain or swelling All other systems reviewed and are negative except as documented above in ROS and HPI.  ____________________________________________   PHYSICAL EXAM:  VITAL SIGNS: ED Triage Vitals  Enc Vitals Group     BP 06/11/21 1230 130/80     Pulse Rate 06/11/21 1230 (!) 102     Resp 06/11/21 1230  20     Temp 06/11/21 1230 98.5 F (36.9 C)     Temp Source 06/11/21 1230 Oral     SpO2 06/11/21 1230 100 %     Weight 06/11/21 1227 118 lb 2.7 oz (53.6 kg)     Height 06/11/21 1227 4' 10.5" (1.486 m)  Head Circumference --      Peak Flow --      Pain Score 06/11/21 1227 0     Pain Loc --      Pain Edu? --      Excl. in [place]? --     Vital signs reviewed, nursing assessments reviewed.   Constitutional:   Alert and oriented. Non-toxic appearance. Eyes:   Conjunctivae are normal. EOMI. PERRL. ENT      Head:   Normocephalic and atraumatic.      Nose:   Wearing a mask.      Mouth/Throat:   Wearing a mask.      Neck:   No meningismus. Full ROM. Hematological/Lymphatic/Immunilogical:   No cervical lymphadenopathy. Cardiovascular:   RRR. Symmetric bilateral radial and DP pulses.  No murmurs. Cap refill less than 2 seconds. Respiratory:   Tachypnea and accessory muscle use.  Diffuse expiratory wheezing prolonged expiratory phase.  No focal crackles.  Symmetric air movement. Gastrointestinal:   Soft and nontender. Non distended. There is no CVA tenderness.  No rebound, rigidity, or guarding. Genitourinary:   deferred Musculoskeletal:   Normal range of motion in all extremities. No joint effusions.  No lower extremity tenderness.  No edema. Neurologic:   Normal speech and language.  Motor grossly intact. No acute focal neurologic deficits are appreciated.  Skin:    Skin is warm, dry and intact. No rash noted.  No petechiae, purpura, or bullae.  ____________________________________________    LABS (pertinent positives/negatives) (all labs ordered are listed, but only abnormal results are displayed) Labs Reviewed  BASIC METABOLIC PANEL - Abnormal; Notable for the following components:      Result Value   Glucose, Bld 187 (*)    Calcium 8.6 (*)    All other components within normal limits  RESP PANEL BY RT-PCR (FLU A&B, COVID) [id number]  CBC WITH DIFFERENTIAL/PLATELET    ____________________________________________   EKG  Interpreted by me Normal sinus rhythm rate of 99, normal axis.  Left bundle branch block.  No acute ischemic changes.  ____________________________________________    YOVZCHYIF  DG Chest Portable 1 View  Result Date: 06/11/2021 CLINICAL DATA:  Shortness of breath, wheezing. EXAM: PORTABLE CHEST 1 VIEW COMPARISON:  Chest radiograph 05/06/2021; CT chest 05/01/2021 FINDINGS: Upper lobe predominant emphysematous changes with reticulation at the lung bases. Interstitial and patchy airspace opacities in the mid and lower lungs, right greater than left. No pleural effusion or pneumothorax. Scarring at the right apex. Heart is normal in size. No acute osseous abnormality. IMPRESSION: Interstitial and patchy airspace opacities in the mid and lower lungs, right-greater-than-left. Findings are concerning for multifocal pneumonia in the appropriate clinical context. Emphysema (ICD10-J43.9). Electronically Signed   By: Ileana Roup M.D.   On: 06/11/2021 14:20    ____________________________________________   PROCEDURES Procedures  ____________________________________________    CLINICAL IMPRESSION / ASSESSMENT AND PLAN / ED COURSE  Medications ordered in the ED: Medications  vancomycin (VANCOCIN) IVPB 1000 mg/200 mL premix (has no administration in time range)  ceFEPIme (MAXIPIME) 2 g in sodium chloride 0.9 % 100 mL IVPB (has no administration in time range)  albuterol (PROVENTIL) (2.5 MG/3ML) 0.083% nebulizer solution 5 mg (5 mg Nebulization Given 06/11/21 1348)  magnesium sulfate IVPB 2 g 50 mL (0 g Intravenous Stopped 06/11/21 1512)    Pertinent labs & imaging results that were available during my care of the patient were reviewed by me and considered in my medical decision making (see chart for details).  Darylene Price  Braaten was evaluated in Emergency Department on 06/11/2021 for the symptoms described in the history of present illness.  She was evaluated in the context of the global COVID-19 pandemic, which necessitated consideration that the patient might be at risk for infection with the SARS-CoV-2 virus that causes COVID-19. Institutional protocols and algorithms that pertain to the evaluation of patients at risk for COVID-19 are in a state of rapid change based on information released by regulatory bodies including the CDC and federal and state organizations. These policies and algorithms were followed during the patient's care in the ED.   Patient presents with wheezing and shortness of breath, clinically apparent COPD exacerbation.  Will check labs and chest x-ray to evaluate for pneumonia, electrolyte abnormalities, pulmonary edema, or pneumothorax.  Doubt ACS PE dissection or pericarditis.  If work-up is reassuring and patient is feeling much better after bronchodilators and steroids, she may be discharged home to continue course of prednisone and antibiotics.  However, if symptoms are refractory she may require hospitalization.   ----------------------------------------- 3:25 PM on 06/11/2021 ----------------------------------------- Oxygenation maintaining about 92% on 3 L nasal cannula.  She is still very wheezy, short of breath, prolonged expiratory phase.  Chest x-ray shows multifocal infiltrates concerning for pneumonia.  With her underlying medical history, previous treatments for pneumonia and hospitalization, will treat for HCAP with cefepime and vancomycin.  Discussed with hospitalist for further management.  Patient is not septic.     ____________________________________________   FINAL CLINICAL IMPRESSION(S) / ED DIAGNOSES    Final diagnoses:  COPD exacerbation ([place])  Acute on chronic respiratory failure with hypoxia [facility])     ED Discharge Orders     None       Portions of this note were generated with dragon dictation software. Dictation errors may occur despite best attempts at  proofreading.    Carrie Mew, MD 06/11/21 1339    Carrie Mew, MD 06/11/21 1525

## 2021-06-12 ENCOUNTER — Encounter: Payer: Self-pay | Admitting: Internal Medicine

## 2021-06-12 DIAGNOSIS — J9621 Acute and chronic respiratory failure with hypoxia: Secondary | ICD-10-CM | POA: Diagnosis not present

## 2021-06-12 DIAGNOSIS — I5022 Chronic systolic (congestive) heart failure: Secondary | ICD-10-CM | POA: Diagnosis not present

## 2021-06-12 DIAGNOSIS — E0800 Diabetes mellitus due to underlying condition with hyperosmolarity without nonketotic hyperglycemic-hyperosmolar coma (NKHHC): Secondary | ICD-10-CM

## 2021-06-12 DIAGNOSIS — E7849 Other hyperlipidemia: Secondary | ICD-10-CM

## 2021-06-12 DIAGNOSIS — J441 Chronic obstructive pulmonary disease with (acute) exacerbation: Secondary | ICD-10-CM | POA: Diagnosis not present

## 2021-06-12 LAB — BASIC METABOLIC PANEL
Anion gap: 9 (ref 5–15)
BUN: 21 mg/dL (ref 8–23)
CO2: 26 mmol/L (ref 22–32)
Calcium: 8.5 mg/dL — ABNORMAL LOW (ref 8.9–10.3)
Chloride: 101 mmol/L (ref 98–111)
Creatinine, Ser: 0.7 mg/dL (ref 0.44–1.00)
GFR, Estimated: >60 mL/min (ref >60)
Glucose, Bld: 275 mg/dL — ABNORMAL HIGH (ref 70–99)
Potassium: 3.8 mmol/L (ref 3.5–5.1)
Sodium: 136 mmol/L (ref 135–145)

## 2021-06-12 LAB — CBC WITH DIFFERENTIAL/PLATELET
Abs Immature Granulocytes: 0.02 10*3/uL (ref 0.00–0.07)
Basophils Absolute: 0 10*3/uL (ref 0.0–0.1)
Basophils Relative: 0 %
Eosinophils Absolute: 0 10*3/uL (ref 0.0–0.5)
Eosinophils Relative: 0 %
HCT: 32.8 % — ABNORMAL LOW (ref 36.0–46.0)
Hemoglobin: 10.3 g/dL — ABNORMAL LOW (ref 12.0–15.0)
Immature Granulocytes: 0 %
Lymphocytes Relative: 10 %
Lymphs Abs: 0.8 10*3/uL (ref 0.7–4.0)
MCH: 29.7 pg (ref 26.0–34.0)
MCHC: 31.4 g/dL (ref 30.0–36.0)
MCV: 94.5 fL (ref 80.0–100.0)
Monocytes Absolute: 0.7 10*3/uL (ref 0.1–1.0)
Monocytes Relative: 10 %
Neutro Abs: 5.9 10*3/uL (ref 1.7–7.7)
Neutrophils Relative %: 80 %
Platelets: 211 10*3/uL (ref 150–400)
RBC: 3.47 MIL/uL — ABNORMAL LOW (ref 3.87–5.11)
RDW: 13.8 % (ref 11.5–15.5)
WBC: 7.4 10*3/uL (ref 4.0–10.5)
nRBC: 0 % (ref 0.0–0.2)

## 2021-06-12 LAB — CBG MONITORING, ED
Glucose-Capillary: 179 mg/dL — ABNORMAL HIGH (ref 70–99)
Glucose-Capillary: 118 mg/dL — ABNORMAL HIGH (ref 70–99)
Glucose-Capillary: 233 mg/dL — ABNORMAL HIGH (ref 70–99)

## 2021-06-12 LAB — HEMOGLOBIN A1C
Hgb A1c MFr Bld: 6.6 % — ABNORMAL HIGH (ref 4.8–5.6)
Mean Plasma Glucose: 142.72 mg/dL

## 2021-06-12 LAB — GLUCOSE, CAPILLARY: Glucose-Capillary: 152 mg/dL — ABNORMAL HIGH (ref 70–99)

## 2021-06-12 MED ORDER — ACETAMINOPHEN 325 MG PO TABS
650.0000 mg | ORAL_TABLET | Freq: Four times a day (QID) | ORAL | Status: DC | PRN
  Administered 2021-06-12 – 2021-06-13 (×2): 650 mg via ORAL
  Filled 2021-06-12 (×2): qty 2

## 2021-06-12 NOTE — Progress Notes (Signed)
Pt received from ED, VSS and NAD noted @ this time. Oxygen noted @ 3L via [place]. Resp even and unlabored. Pt denies any needs at this time and will continue to monitor.

## 2021-06-12 NOTE — Progress Notes (Signed)
PROGRESS NOTE    Angela Jensen  OFB:[id number] DOB: 1956/07/24 DOA: 06/11/2021 PCP: Angela Guise, MD    Brief Narrative:  The patient is a 65 yr old woman who states that she began having shortness of breath at 10:30 this am. She denies fevers, chills, production of purulent sputum, or cough. She does have chest tightness with deep respirations.    She has a past medical history significant for Cancer of the Right Lung, Chronic hypoxic respiratory failure, HFREF, COPD, DM II, Hypertension, Hyperlipidemia, and PAD.    In the ED she was found to require 4L to maintain saturations in the 90's. Baseline O2 requirements at home are 2L by [place]. CXR demonstrated Emphysema, and possible multifocal pneumonia.     Consultants:    Procedures:   Antimicrobials:      Subjective: Little better, but still with sob  Objective: Vitals:   06/12/21 0300 06/12/21 0404 06/12/21 0600 06/12/21 0900  BP: (!) 96/58 104/64 (!) 161/82 111/71  Pulse: 83 80 78 83  Resp: 18 16 18 16   Temp:      TempSrc:      SpO2: 98% 99% 97% 95%  Weight:      Height:        Intake/Output Summary (Last 24 hours) at 06/12/2021 1445 Last data filed at 06/11/2021 1805 Gross per 24 hour  Intake 350 ml  Output --  Net 350 ml   Filed Weights   06/11/21 1227  Weight: 53.6 kg    Examination:  General exam: Appears calm and comfortable  Respiratory system: decrease bs, no wheezing Cardiovascular system: S1 & S2 heard, RRR. No JVD, murmurs, rubs, gallops or clicks.  Gastrointestinal system: Abdomen is nondistended, soft and nontender. No organomegaly or masses felt. Normal bowel sounds heard. Central nervous system: Alert and oriented.  Grossly intact  Extremities: No edema Psychiatry:  Mood & affect appropriate.     Data Reviewed: I have personally reviewed following labs and imaging studies  CBC: Recent Labs  Lab 06/11/21 1344 06/12/21 0529  WBC 4.3 7.4  NEUTROABS 1.8 5.9  HGB 12.5 10.3*  HCT 41.2  32.8*  MCV 96.0 94.5  PLT 268 782   Basic Metabolic Panel: Recent Labs  Lab 06/11/21 1344 06/12/21 0529  NA 140 136  K 4.4 3.8  CL 104 101  CO2 25 26  GLUCOSE 187* 275*  BUN 16 21  CREATININE 0.82 0.70  CALCIUM 8.6* 8.5*   GFR: Estimated Creatinine Clearance: 51.7 mL/min (by C-G formula based on SCr of 0.7 mg/dL). Liver Function Tests: No results for input(s): AST, ALT, ALKPHOS, BILITOT, PROT, ALBUMIN in the last 168 hours. No results for input(s): LIPASE, AMYLASE in the last 168 hours. No results for input(s): AMMONIA in the last 168 hours. Coagulation Profile: No results for input(s): INR, PROTIME in the last 168 hours. Cardiac Enzymes: No results for input(s): CKTOTAL, CKMB, CKMBINDEX, TROPONINI in the last 168 hours. BNP (last 3 results) No results for input(s): PROBNP in the last 8760 hours. HbA1C: Recent Labs    06/11/21 1344  HGBA1C 6.6*   CBG: Recent Labs  Lab 06/11/21 1805 06/11/21 2007 06/11/21 2101 06/12/21 0839 06/12/21 1218  GLUCAP 247* 305* 290* 179* 118*   Lipid Profile: No results for input(s): CHOL, HDL, LDLCALC, TRIG, CHOLHDL, LDLDIRECT in the last 72 hours. Thyroid Function Tests: No results for input(s): TSH, T4TOTAL, FREET4, T3FREE, THYROIDAB in the last 72 hours. Anemia Panel: No results for input(s): VITAMINB12, FOLATE, FERRITIN, TIBC,  IRON, RETICCTPCT in the last 72 hours. Sepsis Labs: Recent Labs  Lab 06/11/21 1344  PROCALCITON <0.10    Recent Results (from the past 240 hour(s))  Resp Panel by RT-PCR (Flu A&B, Covid) Nasopharyngeal Swab     Status: None   Collection Time: 06/11/21  1:44 PM   Specimen: Nasopharyngeal Swab; Nasopharyngeal(NP) swabs in vial transport medium  Result Value Ref Range Status   SARS Coronavirus 2 by RT PCR NEGATIVE NEGATIVE Final    Comment: (NOTE) SARS-CoV-2 target nucleic acids are NOT DETECTED.  The SARS-CoV-2 RNA is generally detectable in upper respiratory specimens during the acute phase of  infection. The lowest concentration of SARS-CoV-2 viral copies this assay can detect is 138 copies/mL. A negative result does not preclude SARS-Cov-2 infection and should not be used as the sole basis for treatment or other patient management decisions. A negative result may occur with  improper specimen collection/handling, submission of specimen other than nasopharyngeal swab, presence of viral mutation(s) within the areas targeted by this assay, and inadequate number of viral copies(<138 copies/mL). A negative result must be combined with clinical observations, patient history, and epidemiological information. The expected result is Negative.  Fact Sheet for Patients:  EntrepreneurPulse.com.au  Fact Sheet for Healthcare Providers:  IncredibleEmployment.be  This test is no t yet approved or cleared by the [place] FDA and  has been authorized for detection and/or diagnosis of SARS-CoV-2 by FDA under an Emergency Use Authorization (EUA). This EUA will remain  in effect (meaning this test can be used) for the duration of the COVID-19 declaration under Section 564(b)(1) of the Act, 21 U.S.C.section 360bbb-3(b)(1), unless the authorization is terminated  or revoked sooner.       Influenza A by PCR NEGATIVE NEGATIVE Final   Influenza B by PCR NEGATIVE NEGATIVE Final    Comment: (NOTE) The Xpert Xpress SARS-CoV-2/FLU/RSV plus assay is intended as an aid in the diagnosis of influenza from Nasopharyngeal swab specimens and should not be used as a sole basis for treatment. Nasal washings and aspirates are unacceptable for Xpert Xpress SARS-CoV-2/FLU/RSV testing.  Fact Sheet for Patients: EntrepreneurPulse.com.au  Fact Sheet for Healthcare Providers: IncredibleEmployment.be  This test is not yet approved or cleared by the [place] FDA and has been authorized for detection and/or diagnosis of SARS-CoV-2  by FDA under an Emergency Use Authorization (EUA). This EUA will remain in effect (meaning this test can be used) for the duration of the COVID-19 declaration under Section 564(b)(1) of the Act, 21 U.S.C. section 360bbb-3(b)(1), unless the authorization is terminated or revoked.  Performed at [facility], [address] [place], [place] [address]          Radiology Studies: DG Chest Portable 1 View  Result Date: 06/11/2021 CLINICAL DATA:  Shortness of breath, wheezing. EXAM: PORTABLE CHEST 1 VIEW COMPARISON:  Chest radiograph 05/06/2021; CT chest 05/01/2021 FINDINGS: Upper lobe predominant emphysematous changes with reticulation at the lung bases. Interstitial and patchy airspace opacities in the mid and lower lungs, right greater than left. No pleural effusion or pneumothorax. Scarring at the right apex. Heart is normal in size. No acute osseous abnormality. IMPRESSION: Interstitial and patchy airspace opacities in the mid and lower lungs, right-greater-than-left. Findings are concerning for multifocal pneumonia in the appropriate clinical context. Emphysema (ICD10-J43.9). Electronically Signed   By: Ileana Roup M.D.   On: 06/11/2021 14:20        Scheduled Meds:  carvedilol  3.125 mg Oral BID WC   clopidogrel  75 mg Oral Daily   enoxaparin (LOVENOX) injection  40 mg Subcutaneous Q24H   escitalopram  10 mg Oral Daily   ferrous sulfate  324 mg Oral BID WC   fluticasone furoate-vilanterol  1 puff Inhalation Daily   gabapentin  100 mg Oral BID   insulin aspart  0-20 Units Subcutaneous TID WC   insulin aspart  0-5 Units Subcutaneous QHS   insulin aspart  6 Units Subcutaneous TID WC   insulin detemir  14 Units Subcutaneous Q2200   ipratropium-albuterol  3 mL Nebulization Q6H   lisinopril  10 mg Oral Daily   metFORMIN  500 mg Oral Q breakfast   methylPREDNISolone (SOLU-MEDROL) injection  60 mg Intravenous Q24H   omega-3 acid ethyl esters  1 g Oral Daily   potassium  chloride  10 mEq Oral QODAY   Continuous Infusions:  ceFEPime (MAXIPIME) IV Stopped (06/12/21 1005)   vancomycin      Assessment & Plan:   Active Problems:   Essential hypertension   Diabetes (HCC)   Hyperlipidemia   Acute on chronic respiratory failure with hypoxia (HCC)   Acute exacerbation of chronic obstructive pulmonary disease (COPD) (HCC)   Chronic systolic CHF (congestive heart failure) (HCC)   Acute exacerbation of chronic obstructive pulmonary disease (COPD) (HCC) Mildly improving Continue inhalers Continue IV antibiotics   Pneumonia On cxr Continue iv abx   Acute on chronic respiratory failure with hypoxia (HCC) Baseline oxygen requirements are 2L by [place] continuous at home. She is currently requiring 4L by [place] continuous at rest to maintain saturations in the 90's so far.  10/27 wean 02 as tolerated, tx as above  Essential hypertension Continue Coreg and lisinopril   Chronic systolic CHF (congestive heart failure) (HCC) Echocardiogram in 03/31/2021 has demonstrated EF of 45-50% with global hypokinesis. Monitor volume status. Appears euvolemic at this time.   Diabetes ([place]) Type II. The patient is on levemir 8-12 units daily at home. Due to steroids I have increased her daily dose to 14 units daily. This will be accompanied by SSI with FSBS and prandial insulin.     DVT prophylaxis: Lovenox Code Status: Full Family Communication: None at bedside Disposition Plan: Home Status is: Inpatient  Remains inpatient appropriate because: Patient receiving IV treatment due to severity of illness requiring hospitalization            LOS: 1 day   Time spent: 35 minutes with more than 50% on [place], MD [facility] Pager [phone]  If 7PM-7AM, please contact night-coverage 06/12/2021, 2:45 PM

## 2021-06-12 NOTE — Evaluation (Signed)
Physical Therapy Evaluation Patient Details Name: Angela Jensen MRN: [id number] DOB: [birth year] Today's Date: 06/12/2021  History of Present Illness  Angela Jensen is a 65 y.o. female with medical history significant for chronic hypoxic respiratory failure on 2 L continuous nasal cannula, COPD, type 2 diabetes mellitus, hypertension, chronic systolic heart failure, who arrived to [facility] ED on 06/11/21 with acute COPD exacerbation.    Clinical Impression  Pt received in supine position and agreeable to therapy.  Pt notes she is somewhat fatigued from getting up to use the restroom with OT prior to PT arrival.  Pt notes that she is would be willing to participate in bed-level therex however.  Pt has good techniques with exercises, noting an increased HR upon performing (<120 BPM).  Pt denies neededing any PT services following hospital stay at this time as she feels she is closer to baseline level.  Pt will continue to benefit from being on caseload during hospital stay in order to increase muscular strength of LE's and to be able to safely ambulate community distances following d/c.         Recommendations for follow up therapy are one component of a multi-disciplinary discharge planning process, led by the attending physician.  Recommendations may be updated based on patient status, additional functional criteria and insurance authorization.  Follow Up Recommendations No PT follow up    Assistance Recommended at Discharge None  Functional Status Assessment Patient has not had a recent decline in their functional status  Equipment Recommendations  None recommended by PT    Recommendations for Other Services       Precautions / Restrictions Precautions Precautions: None Restrictions Weight Bearing Restrictions: No      Mobility  Bed Mobility               General bed mobility comments: pt denied performing bed and general mobility due to being up with OT prior to PT session.     Transfers                        Ambulation/Gait                Stairs            Wheelchair Mobility    Modified Rankin (Stroke Patients Only)       Balance Overall balance assessment: Mild deficits observed, not formally tested                                           Pertinent Vitals/Pain Pain Assessment: No/denies pain    Home Living Family/patient expects to be discharged to:: Private residence Living Arrangements: Spouse/significant other Available Help at Discharge: Family Type of Home: House Home Access: Ramped entrance       Home Layout: One level;Other (Comment) Home Equipment: Rolling Walker (2 wheels);Cane - single point;BSC      Prior Function Prior Level of Function : Independent/Modified Independent                     Hand Dominance        Extremity/Trunk Assessment                Communication   Communication: No difficulties  Cognition Arousal/Alertness: Awake/alert Behavior During Therapy: WFL for tasks assessed/performed Overall Cognitive Status: Within Functional Limits for tasks assessed  General Comments      Exercises Total Joint Exercises Ankle Circles/Pumps: AROM;Strengthening;Both;10 reps;Supine Quad Sets: AROM;Strengthening;Both;10 reps;Supine Gluteal Sets: AROM;Strengthening;Both;10 reps;Supine Heel Slides: AROM;Strengthening;Both;10 reps;Supine Hip ABduction/ADduction: AROM;Strengthening;Both;10 reps;Supine Straight Leg Raises: AROM;Strengthening;Both;10 reps;Supine Other Exercises Other Exercises: Pt educated on role of PT and services provided during hospital stay.  Pt also encouraged to continue with therapeutic exercises in order to prevent deconditioning during hospital stay.   Assessment/Plan    PT Assessment Patient needs continued PT services  PT Problem List Decreased strength;Decreased activity  tolerance;Decreased balance       PT Treatment Interventions DME instruction;Gait training;Functional mobility training;Therapeutic activities;Therapeutic exercise;Balance training;Neuromuscular re-education    PT Goals (Current goals can be found in the Care Plan section)  Acute Rehab PT Goals Patient Stated Goal: to go home. PT Goal Formulation: With patient Time For Goal Achievement: 06/26/21 Potential to Achieve Goals: Good    Frequency Min 2X/week   Barriers to discharge        Co-evaluation               AM-PAC PT "6 Clicks" Mobility  Outcome Measure Help needed turning from your back to your side while in a flat bed without using bedrails?: A Little Help needed moving from lying on your back to sitting on the side of a flat bed without using bedrails?: A Little Help needed moving to and from a bed to a chair (including a wheelchair)?: A Little Help needed standing up from a chair using your arms (e.g., wheelchair or bedside chair)?: A Little Help needed to walk in hospital room?: A Little Help needed climbing 3-5 steps with a railing? : A Little 6 Click Score: 18    End of Session   Activity Tolerance: Patient tolerated treatment well Patient left: in bed;with call bell/phone within reach Nurse Communication: Mobility status PT Visit Diagnosis: Unsteadiness on feet (R26.81);Muscle weakness (generalized) (M62.81);Difficulty in walking, not elsewhere classified (R26.2)    Time: 6712-4580 PT Time Calculation (min) (ACUTE ONLY): 16 min   Charges:   PT Evaluation $PT Eval Low Complexity: 1 Low PT Treatments $Therapeutic Exercise: 8-22 mins        Gwenlyn Saran, PT, DPT 06/12/21, 11:50 AM   Christie Nottingham 06/12/2021, 11:46 AM

## 2021-06-12 NOTE — Evaluation (Signed)
Occupational Therapy Evaluation Patient Details Name: Angela Jensen MRN: [id number] DOB: Dec 21, 1955 Today's Date: 06/12/2021   History of Present Illness Angela Jensen is a 65 y.o. female with medical history significant for chronic hypoxic respiratory failure on 2 L continuous nasal cannula, COPD, type 2 diabetes mellitus, hypertension, chronic systolic heart failure, who arrived to [facility] ED on 06/11/21 with acute COPD exacerbation.   Clinical Impression   Pt was seen for OT evaluation this date. Prior to hospital admission, pt was mod indep with ADL and mobility at home, only using a [facility] when leaving the house. Currently pt demonstrates impairments as described below (See OT problem list) which functionally limit her ability to perform ADL/self-care tasks. Pt instructed in PLB and home/routines modifications to support safety and activity pacing to minimize SOB. Pt currently requires supervision assist and VC for use of learned ECS. SpO2 in mid 90's throughout, 3L O2. HR up to 120's with exertion, back down to ~100 at end of session. Pt would benefit from skilled OT services to address noted impairments and functional limitations (see below for any additional details) in order to maximize safety and independence while minimizing falls risk and caregiver burden. Upon hospital discharge, do not anticipate skilled OT needs.     Recommendations for follow up therapy are one component of a multi-disciplinary discharge planning process, led by the attending physician.  Recommendations may be updated based on patient status, additional functional criteria and insurance authorization.   Follow Up Recommendations  No OT follow up    Assistance Recommended at Discharge PRN  Functional Status Assessment  Patient has had a recent decline in their functional status and demonstrates the ability to make significant improvements in function in a reasonable and predictable amount of time.  Equipment  Recommendations  None recommended by OT    Recommendations for Other Services       Precautions / Restrictions Precautions Precautions: None Restrictions Weight Bearing Restrictions: No      Mobility Bed Mobility Overal bed mobility: Modified Independent             General bed mobility comments: pt denied performing bed and general mobility due to being up with OT prior to PT session.    Transfers Overall transfer level: Needs assistance Equipment used: 1 person hand held assist Transfers: Sit to/from Stand Sit to Stand: From elevated surface;Min guard;Supervision                  Balance Overall balance assessment: Mild deficits observed, not formally tested                                         ADL either performed or assessed with clinical judgement   ADL Overall ADL's : Modified independent                                       General ADL Comments: Supervision for seated LB ADL, remote supervision for toileting, Supv for ADL mobility while she pushed the O2 tank.     Vision         Perception     Praxis      Pertinent Vitals/Pain Pain Assessment: 0-10 Pain Score: 4  Pain Location: headache Pain Descriptors / Indicators: Headache Pain Intervention(s): Limited activity within patient's tolerance;Monitored  during session;Patient requesting pain meds-RN notified;Repositioned     Hand Dominance     Extremity/Trunk Assessment Upper Extremity Assessment Upper Extremity Assessment: Overall WFL for tasks assessed   Lower Extremity Assessment Lower Extremity Assessment: Overall WFL for tasks assessed       Communication Communication Communication: No difficulties   Cognition Arousal/Alertness: Awake/alert Behavior During Therapy: WFL for tasks assessed/performed Overall Cognitive Status: Within Functional Limits for tasks assessed                                       General  Comments       Exercises Other Exercises: Pt instructed in PLB and home/routines modifications to support safety and activity pacing to minimize SOB.   Shoulder Instructions      Home Living Family/patient expects to be discharged to:: Private residence Living Arrangements: Spouse/significant other Available Help at Discharge: Family Type of Home: House Home Access: [place]: One level;Other (Comment)     Bathroom Shower/Tub: Teacher, early years/pre: Standard Bathroom Accessibility: Yes   Home Equipment: Conservation officer, nature (2 wheels);Cane - single point;BSC          Prior Functioning/Environment Prior Level of Function : Independent/Modified Independent                        OT Problem List: Decreased activity tolerance;Cardiopulmonary status limiting activity      OT Treatment/Interventions: Self-care/ADL training;Therapeutic exercise;Therapeutic activities;Energy conservation;DME and/or AE instruction;Patient/family education;Balance training    OT Goals(Current goals can be found in the care plan section) Acute Rehab OT Goals Patient Stated Goal: get better and go home OT Goal Formulation: With patient Time For Goal Achievement: 06/26/21 Potential to Achieve Goals: Good ADL Goals Pt Will Perform Lower Body Dressing: with modified independence;sit to/from stand Pt Will Transfer to Toilet: with modified independence;ambulating Additional ADL Goal #1: Pt will perform ADL while using learned ECS without nee for VCs, 5/5 opportunities.  OT Frequency: Min 1X/week   Barriers to D/C:            Co-evaluation              AM-PAC OT "6 Clicks" Daily Activity     Outcome Measure Help from another person eating meals?: None Help from another person taking care of personal grooming?: None Help from another person toileting, which includes using toliet, bedpan, or urinal?: A Little Help from another person bathing  (including washing, rinsing, drying)?: A Little Help from another person to put on and taking off regular upper body clothing?: None Help from another person to put on and taking off regular lower body clothing?: A Little 6 Click Score: 21   End of Session Equipment Utilized During Treatment: Oxygen;Gait belt Nurse Communication: Mobility status;Patient requests pain meds  Activity Tolerance: Patient tolerated treatment well Patient left: in bed;with call bell/phone within reach  OT Visit Diagnosis: Other abnormalities of gait and mobility (R26.89)                Time: 9323-5573 OT Time Calculation (min): 25 min Charges:  OT General Charges $OT Visit: 1 Visit OT Evaluation $OT Eval Low Complexity: 1 Low OT Treatments $Self Care/Home Management : 8-22 mins  Ardeth Perfect., MPH, MS, OTR/L ascom [phone] 06/12/21, 1:07 PM

## 2021-06-13 ENCOUNTER — Telehealth: Payer: Self-pay

## 2021-06-13 DIAGNOSIS — I5022 Chronic systolic (congestive) heart failure: Secondary | ICD-10-CM | POA: Diagnosis not present

## 2021-06-13 DIAGNOSIS — J9621 Acute and chronic respiratory failure with hypoxia: Secondary | ICD-10-CM | POA: Diagnosis not present

## 2021-06-13 DIAGNOSIS — J441 Chronic obstructive pulmonary disease with (acute) exacerbation: Secondary | ICD-10-CM | POA: Diagnosis not present

## 2021-06-13 DIAGNOSIS — E785 Hyperlipidemia, unspecified: Secondary | ICD-10-CM | POA: Diagnosis not present

## 2021-06-13 LAB — GLUCOSE, CAPILLARY
Glucose-Capillary: 69 mg/dL — ABNORMAL LOW (ref 70–99)
Glucose-Capillary: 111 mg/dL — ABNORMAL HIGH (ref 70–99)
Glucose-Capillary: 149 mg/dL — ABNORMAL HIGH (ref 70–99)
Glucose-Capillary: 258 mg/dL — ABNORMAL HIGH (ref 70–99)
Glucose-Capillary: 201 mg/dL — ABNORMAL HIGH (ref 70–99)

## 2021-06-13 LAB — MRSA NEXT GEN BY PCR, NASAL: MRSA by PCR Next Gen: NOT DETECTED

## 2021-06-13 LAB — HIV ANTIBODY (ROUTINE TESTING W REFLEX): HIV Screen 4th Generation wRfx: NONREACTIVE

## 2021-06-13 MED ORDER — ADULT MULTIVITAMIN W/MINERALS CH
1.0000 | ORAL_TABLET | Freq: Every day | ORAL | Status: DC
  Administered 2021-06-14: 1 via ORAL
  Filled 2021-06-13: qty 1

## 2021-06-13 MED ORDER — INSULIN DETEMIR 100 UNIT/ML ~~LOC~~ SOLN
10.0000 [IU] | Freq: Every day | SUBCUTANEOUS | Status: DC
  Administered 2021-06-13: 10 [IU] via SUBCUTANEOUS
  Filled 2021-06-13 (×2): qty 0.1

## 2021-06-13 MED ORDER — GLUCERNA SHAKE PO LIQD
237.0000 mL | Freq: Three times a day (TID) | ORAL | Status: DC
  Administered 2021-06-13 – 2021-06-14 (×2): 237 mL via ORAL

## 2021-06-13 MED ORDER — IPRATROPIUM-ALBUTEROL 0.5-2.5 (3) MG/3ML IN SOLN
3.0000 mL | Freq: Three times a day (TID) | RESPIRATORY_TRACT | Status: DC
  Administered 2021-06-14 (×2): 3 mL via RESPIRATORY_TRACT
  Filled 2021-06-13 (×2): qty 3

## 2021-06-13 NOTE — Progress Notes (Signed)
Occupational Therapy Treatment Patient Details Name: Angela Jensen MRN: [id number] DOB: [birth year] Today's Date: 06/13/2021   History of present illness Angela Jensen is a 65 y.o. female with medical history significant for chronic hypoxic respiratory failure on 2 L continuous nasal cannula, COPD, type 2 diabetes mellitus, hypertension, chronic systolic heart failure, who arrived to [facility] ED on 06/11/21 with acute COPD exacerbation.   OT comments  Pt seen for OT tx this date. Pt instructed in ECS with handout provided with strategies for home/routines modifications, AE/DME, and activity pacing to support ADL, particularly bathing and cooking. Pt instructed in incentive spirometer requiring MIN VC for technique. Pt set up for seated bath. Pt observed to in her words "rush through" UB bathing while seated. Pt initiated a sitting rest break utilizing PLB. SpO2 89% on 2.5L, HR 112. PLB and <44min improved SpO2 97% and HR 79. Pt requires supervision and UE support on bed rail to stand and perform perihygiene/bathing. Seated rest break before moving on to seated LB bathing. SpO2 down to low 90's, HR 90's with pt demonstrating ability to utilize learned ECS sooner to avoid feeling more SOB. Pt instructed in strategies to minimize SOB/over exertion and the risks associated with "rushing through" activities versus taking her time and utilizing rest breaks and PLB as needed. Pt verbalized understanding. Pt progressing well towards acute OT goals.    Recommendations for follow up therapy are one component of a multi-disciplinary discharge planning process, led by the attending physician.  Recommendations may be updated based on patient status, additional functional criteria and insurance authorization.    Follow Up Recommendations  No OT follow up    Assistance Recommended at Discharge PRN  Equipment Recommendations  None recommended by OT    Recommendations for Other Services      Precautions / Restrictions  Precautions Precautions: None Restrictions Weight Bearing Restrictions: No       Mobility Bed Mobility Overal bed mobility: Modified Independent                  Transfers Overall transfer level: Needs assistance Equipment used: None Transfers: Sit to/from Stand             General transfer comment: utilized bed rail for light UE support during bathing     Balance Overall balance assessment: Mild deficits observed, not formally tested                                         ADL either performed or assessed with clinical judgement   ADL                                         General ADL Comments: Pt set up for seated bath. Pt observed to in her words "rush through" UB bathing while seated. Pt initiated a sitting rest break utilizing PLB. SpO2 89% on 2.5L, HR 112. PLB and <81min improved SpO2 97% and HR 79. Pt requires supervision and UE support on bed rail to stand and perform perihygiene/bathing. Seated rest break before moving on to seated LB bathing. SpO2 down to low 90's, HR 90's with pt demonstrating ability to utilize learned ECS sooner to avoid feeling more SOB. Pt instructed in strategies to minimize SOB/over exertion and the risks associated with "rushing through"  activities versus taking her time and utilizing rest breaks and PLB as needed. Pt verbalized understanding.     Vision       Perception     Praxis      Cognition Arousal/Alertness: Awake/alert Behavior During Therapy: WFL for tasks assessed/performed Overall Cognitive Status: Within Functional Limits for tasks assessed                                            Exercises Other Exercises Other Exercises: Pt instructed in incentive spirometer use, with min VC for improved technique before pt able to return demo proper use.   Shoulder Instructions       General Comments      Pertinent Vitals/ Pain       Pain Assessment: No/denies  pain  Home Living                                          Prior Functioning/Environment              Frequency  Min 1X/week        Progress Toward Goals  OT Goals(current goals can now be found in the care plan section)  Progress towards OT goals: Progressing toward goals  Acute Rehab OT Goals Patient Stated Goal: get better and go home OT Goal Formulation: With patient Time For Goal Achievement: 06/26/21 Potential to Achieve Goals: Good  Plan Discharge plan remains appropriate;Frequency remains appropriate    Co-evaluation                 AM-PAC OT "6 Clicks" Daily Activity     Outcome Measure   Help from another person eating meals?: None Help from another person taking care of personal grooming?: None Help from another person toileting, which includes using toliet, bedpan, or urinal?: None Help from another person bathing (including washing, rinsing, drying)?: A Little Help from another person to put on and taking off regular upper body clothing?: None Help from another person to put on and taking off regular lower body clothing?: None 6 Click Score: 23    End of Session Equipment Utilized During Treatment: Oxygen  OT Visit Diagnosis: Other abnormalities of gait and mobility (R26.89)   Activity Tolerance Patient tolerated treatment well   Patient Left in bed;with call bell/phone within reach   Nurse Communication          Time: 9480-1655 OT Time Calculation (min): 39 min  Charges: OT General Charges $OT Visit: 1 Visit OT Treatments $Self Care/Home Management : 38-52 mins  Ardeth Perfect., MPH, MS, OTR/L ascom [phone] 06/13/21, 10:49 AM

## 2021-06-13 NOTE — Progress Notes (Signed)
PROGRESS NOTE    Angela Jensen  ACZ:[id number] DOB: 10-Aug-1956 DOA: 06/11/2021 PCP: [name], MD    Brief Narrative:  The patient is a 65 yr old woman who states that she began having shortness of breath at 10:30 this am. She denies fevers, chills, production of purulent sputum, or cough. She does have chest tightness with deep respirations.    She has a past medical history significant for Cancer of the Right Lung, Chronic hypoxic respiratory failure, HFREF, COPD, DM II, Hypertension, Hyperlipidemia, and PAD.    In the ED she was found to require 4L to maintain saturations in the 90's. Baseline O2 requirements at home are 2L by [place]. CXR demonstrated Emphysema, and possible multifocal pneumonia.   10/28 feels a little better but still with dyspnea on exertion not quite at baseline.  Consultants:    Procedures:   Antimicrobials:      Subjective: No chest pain or dizziness or any other complaints   Objective: Vitals:   06/13/21 0200 06/13/21 0457 06/13/21 0725 06/13/21 0731  BP:  120/64 (!) 147/86   Pulse:  84 71   Resp:  17 18   Temp:  97.9 F (36.6 C) 98.1 F (36.7 C)   TempSrc:  Oral Oral   SpO2: 98% 100% 100% 98%  Weight:      Height:        Intake/Output Summary (Last 24 hours) at 06/13/2021 0854 Last data filed at 06/13/2021 0800 Gross per 24 hour  Intake 290.72 ml  Output 500 ml  Net -209.28 ml   Filed Weights   06/11/21 1227  Weight: 53.6 kg    Examination: Calm, NAD Decrease breath sounds no wheezing Regular S1-S2 no gallops Soft benign positive bowel sounds No edema Awake oriented x3   Data Reviewed: I have personally reviewed following labs and imaging studies  CBC: Recent Labs  Lab 06/11/21 1344 06/12/21 0529  WBC 4.3 7.4  NEUTROABS 1.8 5.9  HGB 12.5 10.3*  HCT 41.2 32.8*  MCV 96.0 94.5  PLT 268 109   Basic Metabolic Panel: Recent Labs  Lab 06/11/21 1344 06/12/21 0529  NA 140 136  K 4.4 3.8  CL 104 101  CO2 25 26   GLUCOSE 187* 275*  BUN 16 21  CREATININE 0.82 0.70  CALCIUM 8.6* 8.5*   GFR: Estimated Creatinine Clearance: 51.7 mL/min (by C-G formula based on SCr of 0.7 mg/dL). Liver Function Tests: No results for input(s): AST, ALT, ALKPHOS, BILITOT, PROT, ALBUMIN in the last 168 hours. No results for input(s): LIPASE, AMYLASE in the last 168 hours. No results for input(s): AMMONIA in the last 168 hours. Coagulation Profile: No results for input(s): INR, PROTIME in the last 168 hours. Cardiac Enzymes: No results for input(s): CKTOTAL, CKMB, CKMBINDEX, TROPONINI in the last 168 hours. BNP (last 3 results) No results for input(s): PROBNP in the last 8760 hours. HbA1C: Recent Labs    06/11/21 1344  HGBA1C 6.6*   CBG: Recent Labs  Lab 06/12/21 1218 06/12/21 1641 06/12/21 2103 06/13/21 0728 06/13/21 0839  GLUCAP 118* 233* 152* 69* 111*   Lipid Profile: No results for input(s): CHOL, HDL, LDLCALC, TRIG, CHOLHDL, LDLDIRECT in the last 72 hours. Thyroid Function Tests: No results for input(s): TSH, T4TOTAL, FREET4, T3FREE, THYROIDAB in the last 72 hours. Anemia Panel: No results for input(s): VITAMINB12, FOLATE, FERRITIN, TIBC, IRON, RETICCTPCT in the last 72 hours. Sepsis Labs: Recent Labs  Lab 06/11/21 1344  PROCALCITON <0.10    Recent  Results (from the past 240 hour(s))  Resp Panel by RT-PCR (Flu A&B, Covid) Nasopharyngeal Swab     Status: None   Collection Time: 06/11/21  1:44 PM   Specimen: Nasopharyngeal Swab; Nasopharyngeal(NP) swabs in vial transport medium  Result Value Ref Range Status   SARS Coronavirus 2 by RT PCR NEGATIVE NEGATIVE Final    Comment: (NOTE) SARS-CoV-2 target nucleic acids are NOT DETECTED.  The SARS-CoV-2 RNA is generally detectable in upper respiratory specimens during the acute phase of infection. The lowest concentration of SARS-CoV-2 viral copies this assay can detect is 138 copies/mL. A negative result does not preclude SARS-Cov-2 infection  and should not be used as the sole basis for treatment or other patient management decisions. A negative result may occur with  improper specimen collection/handling, submission of specimen other than nasopharyngeal swab, presence of viral mutation(s) within the areas targeted by this assay, and inadequate number of viral copies(<138 copies/mL). A negative result must be combined with clinical observations, patient history, and epidemiological information. The expected result is Negative.  Fact Sheet for Patients:  EntrepreneurPulse.com.au  Fact Sheet for Healthcare Providers:  IncredibleEmployment.be  This test is no t yet approved or cleared by the [place] FDA and  has been authorized for detection and/or diagnosis of SARS-CoV-2 by FDA under an Emergency Use Authorization (EUA). This EUA will remain  in effect (meaning this test can be used) for the duration of the COVID-19 declaration under Section 564(b)(1) of the Act, 21 U.S.C.section 360bbb-3(b)(1), unless the authorization is terminated  or revoked sooner.       Influenza A by PCR NEGATIVE NEGATIVE Final   Influenza B by PCR NEGATIVE NEGATIVE Final    Comment: (NOTE) The Xpert Xpress SARS-CoV-2/FLU/RSV plus assay is intended as an aid in the diagnosis of influenza from Nasopharyngeal swab specimens and should not be used as a sole basis for treatment. Nasal washings and aspirates are unacceptable for Xpert Xpress SARS-CoV-2/FLU/RSV testing.  Fact Sheet for Patients: EntrepreneurPulse.com.au  Fact Sheet for Healthcare Providers: IncredibleEmployment.be  This test is not yet approved or cleared by the [place] FDA and has been authorized for detection and/or diagnosis of SARS-CoV-2 by FDA under an Emergency Use Authorization (EUA). This EUA will remain in effect (meaning this test can be used) for the duration of the COVID-19 declaration  under Section 564(b)(1) of the Act, 21 U.S.C. section 360bbb-3(b)(1), unless the authorization is terminated or revoked.  Performed at [facility], [address], [place],  [address]          Radiology Studies: DG Chest Portable 1 View  Result Date: 06/11/2021 CLINICAL DATA:  Shortness of breath, wheezing. EXAM: PORTABLE CHEST 1 VIEW COMPARISON:  Chest radiograph 05/06/2021; CT chest 05/01/2021 FINDINGS: Upper lobe predominant emphysematous changes with reticulation at the lung bases. Interstitial and patchy airspace opacities in the mid and lower lungs, right greater than left. No pleural effusion or pneumothorax. Scarring at the right apex. Heart is normal in size. No acute osseous abnormality. IMPRESSION: Interstitial and patchy airspace opacities in the mid and lower lungs, right-greater-than-left. Findings are concerning for multifocal pneumonia in the appropriate clinical context. Emphysema (ICD10-J43.9). Electronically Signed   By: Ileana Roup M.D.   On: 06/11/2021 14:20        Scheduled Meds:  carvedilol  3.125 mg Oral BID WC   clopidogrel  75 mg Oral Daily   enoxaparin (LOVENOX) injection  40 mg Subcutaneous Q24H   escitalopram  10 mg Oral Daily  ferrous sulfate  324 mg Oral BID WC   fluticasone furoate-vilanterol  1 puff Inhalation Daily   gabapentin  100 mg Oral BID   insulin aspart  0-20 Units Subcutaneous TID WC   insulin aspart  0-5 Units Subcutaneous QHS   insulin aspart  6 Units Subcutaneous TID WC   insulin detemir  14 Units Subcutaneous Q2200   ipratropium-albuterol  3 mL Nebulization Q6H   lisinopril  10 mg Oral Daily   metFORMIN  500 mg Oral Q breakfast   methylPREDNISolone (SOLU-MEDROL) injection  60 mg Intravenous Q24H   omega-3 acid ethyl esters  1 g Oral Daily   potassium chloride  10 mEq Oral QODAY   Continuous Infusions:  ceFEPime (MAXIPIME) IV 2 g (06/13/21 0849)   vancomycin Stopped (06/12/21 2114)    Assessment & Plan:    Active Problems:   Essential hypertension   Diabetes (HCC)   Hyperlipidemia   Acute on chronic respiratory failure with hypoxia (HCC)   Acute exacerbation of chronic obstructive pulmonary disease (COPD) (HCC)   Chronic systolic CHF (congestive heart failure) (HCC)   Acute exacerbation of chronic obstructive pulmonary disease (COPD) ([place]) Slowly improving but still with shortness of breath not quite at baseline Continue inhalers I-S, flutter valves Continue IV antibiotics Any IV steroids with possible p.o. transition tomorrow   Pneumonia Continue IV antibiotics   Acute on chronic respiratory failure with hypoxia (HCC) Baseline oxygen requirements are 2L by [place] continuous at home. She is currently requiring 4L by [place] continuous at rest to maintain saturations in the 90's so far.  10/28 treat above and wean as tolerated to her baseline     Essential hypertension Continue Coreg and lisinopril   Chronic systolic CHF (congestive heart failure) (HCC) Echocardiogram in 03/31/2021 has demonstrated EF of 45-50% with global hypokinesis. Monitor volume status. Appears euvolemic at this time.   Diabetes ([place]) Type II. The patient is on levemir 8-12 units daily at home. Due to steroids I have increased her daily dose to 14 units daily. This will be accompanied by SSI with FSBS and prandial insulin.     DVT prophylaxis: Lovenox Code Status: Full Family Communication: None at bedside Disposition Plan: Home Status is: Inpatient  Remains inpatient appropriate because: Patient receiving IV treatment due to severity of illness requiring hospitalization            LOS: 2 days   Time spent: 35 minutes with more than 50% on [place], MD [facility] Pager [phone]  If 7PM-7AM, please contact night-coverage 06/13/2021, 8:54 AM

## 2021-06-13 NOTE — TOC Initial Note (Signed)
Transition of Care [facility]) - Initial/Assessment Note    Patient Details  Name: Angela Jensen MRN: [id number] Date of Birth: October 01, 1955  Transition of [facility]) CM/SW Contact:    Beverly Sessions, RN Phone Number: 06/13/2021, 2:48 PM  Clinical Narrative:                   Patient admitted from home with respiratory failure  Lives at home with long time boyfriend PCP [facility] Denies issues with transportation or obtaining medications  PT does not recommend and follow up or DME  Patient has chronic home o2, RW, BSC and cane   No anticipated TOC needs at discharge  Expected Discharge Plan: Home/Self Care Barriers to Discharge: Continued Medical Work up   Patient Goals and CMS Choice        Expected Discharge Plan and Services Expected Discharge Plan: Home/Self Care                                              Prior Living Arrangements/Services   Lives with:: Significant Other Patient language and need for interpreter reviewed:: Yes Do you feel safe going back to the place where you live?: Yes      Need for Family Participation in Patient Care: Yes (Comment) Care giver support system in place?: Yes (comment) Current home services: DME Criminal Activity/Legal Involvement Pertinent to Current Situation/Hospitalization: No - Comment as needed  Activities of Daily Living Home Assistive Devices/Equipment: Cane (specify quad or straight), Bedside commode/3-in-1, Shower chair with back ADL Screening (condition at time of admission) Patient's cognitive ability adequate to safely complete daily activities?: Yes Is the patient deaf or have difficulty hearing?: Yes (difficulty hearing per pt.) Does the patient have difficulty seeing, even when wearing glasses/contacts?: No Does the patient have difficulty concentrating, remembering, or making decisions?: No Patient able to express need for assistance with ADLs?: Yes Does the patient have difficulty  dressing or bathing?: Yes Independently performs ADLs?: Yes (appropriate for developmental age) Does the patient have difficulty walking or climbing stairs?: Yes (due to respiratory become SOB) Weakness of Legs: None Weakness of Arms/Hands: None  Permission Sought/Granted                  Emotional Assessment       Orientation: : Oriented to Self, Oriented to Place, Oriented to  Time, Oriented to Situation Alcohol / Substance Use: Not Applicable Psych Involvement: No (comment)  Admission diagnosis:  COPD exacerbation ([place]) [J44.1] COPD with acute exacerbation ([place]) [J44.1] Acute on chronic respiratory failure with hypoxia ([place]) [J96.21] Patient Active Problem List   Diagnosis Date Noted   COPD with acute exacerbation ([place]) 06/11/2021   SOB (shortness of breath) 05/01/2021   HCAP (healthcare-associated pneumonia) 40/98/1191   Chronic systolic CHF (congestive heart failure) ([place]) 05/01/2021   Acute exacerbation of chronic obstructive pulmonary disease (COPD) ([place]) 04/30/2021   Elevated troponin 04/01/2021   Acute on chronic respiratory failure with hypoxia ([place]) 03/31/2021   Pneumothorax after biopsy 05/03/2020   Pneumothorax on right 05/01/2020   Chest pain on breathing 04/21/2020   Pleuritic chest pain 04/11/2020   Right lower lobe pulmonary nodule 04/02/2020   Iron deficiency anemia 04/02/2020   Pica in adults 04/02/2020   Goals of care, counseling/discussion 03/14/2020   Pain in left wrist 09/24/2019   Encounter for general adult medical examination  with abnormal findings 06/28/2019   Elevated total protein 01/22/2019   Uncontrolled type 2 diabetes mellitus with hyperglycemia ([place]) 05/16/2018   Chronic obstructive pulmonary disease ([place]) 05/16/2018   Vitamin D deficiency 05/16/2018   Flu vaccine need 05/16/2018   Dysuria 05/16/2018   Cancer of lower lobe of right lung ([place]) 01/21/2018   Screening for breast cancer 10/12/2017   Personal history of tobacco use,  presenting hazards to health 02/03/2017   PAD (peripheral artery disease) ([place]) 06/22/2016   Essential hypertension 05/26/2016   Diabetes ([place]) 05/26/2016   Hyperlipidemia 05/26/2016   Atherosclerosis of native arteries of extremity with intermittent claudication ([place]) 05/26/2016   Uterine leiomyoma 04/30/2016   Elevated CEA 01/31/2016   Special screening for malignant neoplasms, colon    Benign neoplasm of sigmoid colon    Primary lung cancer ([place])    Malignant neoplasm of upper lobe of right lung [facility])    PCP:  [name], MD Pharmacy:   [facility] [address] ([address]), [place]) [place] [address] Phone: [phone] Fax: [phone]     Social Determinants of Health (SDOH) Interventions    Readmission Risk Interventions Readmission Risk Prevention Plan 06/13/2021 05/05/2021  Transportation Screening Complete Complete  PCP or Specialist Appt within 5-7 Days - Complete  Home Care Screening - Complete  Medication Review (RN CM) - Complete  Social Work Consult for [place] Planning/Counseling Complete -  Palliative Care Screening Not Applicable -  Medication Review (RN Care Manager) Complete -  Some recent data might be hidden

## 2021-06-13 NOTE — Care Management Important Message (Signed)
Important Message  Patient Details  Name: Angela Jensen MRN: [id number] Date of Birth: 06/09/1956   Medicare Important Message Given:  Yes     Dannette Barbara 06/13/2021, 12:33 PM

## 2021-06-13 NOTE — Progress Notes (Signed)
Initial Nutrition Assessment  DOCUMENTATION CODES:   Not applicable  INTERVENTION:   Glucerna Shake po TID, each supplement provides 220 kcal and 10 grams of protein  MVI po daily   NUTRITION DIAGNOSIS:   Increased nutrient needs related to chronic illness (COPD, lung cancer) as evidenced by estimated needs.  GOAL:   Patient will meet greater than or equal to 90% of their needs  MONITOR:   PO intake, Supplement acceptance, Labs, Weight trends, Skin, I & O's  REASON FOR ASSESSMENT:   Consult Assessment of nutrition requirement/status  ASSESSMENT:   65 y/o female with h/o COPD, lung cancer, depression, DM and HTN who is admitted with pansinutitis and PNA.  Met with pt in room today. Pt in good spirits today, sitting up and watching TV. Pt reports good appetite and oral intake at baseline. Pt reports that her appetite is good in hospital; pt ate 95% of her lunch tray today. Pt reports that she drinks strawberry and chocolate Glucerna at home. Of note, pt wears dentures. RD will add supplements and vitamins to help pt meet her estimated needs. Per chart, pt appears weight stable at baseline.   Medications reviewed and include: plavix, lovenox, ferrous sulfate, insulin, metformin, MVI, omega 3, KCl, cefepime   Labs reviewed: K 3.8 wnl Hgb 10.3(L), Hct 32.8(L)  NUTRITION - FOCUSED PHYSICAL EXAM:  Flowsheet Row Most Recent Value  Orbital Region No depletion  Upper Arm Region No depletion  Thoracic and Lumbar Region No depletion  Buccal Region No depletion  Temple Region No depletion  Clavicle Bone Region No depletion  Clavicle and Acromion Bone Region No depletion  Scapular Bone Region No depletion  Dorsal Hand No depletion  Patellar Region Severe depletion  Anterior Thigh Region Moderate depletion  Posterior Calf Region Moderate depletion  Edema (RD Assessment) None  Hair Reviewed  Eyes Reviewed  Mouth Reviewed  Skin Reviewed  Nails Reviewed   Diet Order:    Diet Order             Diet Carb Modified Fluid consistency: Thin; Room service appropriate? Yes  Diet effective now                  EDUCATION NEEDS:   Education needs have been addressed  Skin:  Skin Assessment: Reviewed RN Assessment  Last BM:  10/27  Height:   Ht Readings from Last 1 Encounters:  06/11/21 4' 10.5" (1.486 m)    Weight:   Wt Readings from Last 1 Encounters:  06/11/21 53.6 kg    Ideal Body Weight:  44.5 kg  BMI:  Body mass index is 24.28 kg/m.  Estimated Nutritional Needs:   Kcal:  1300-1500kcal/day  Protein:  65-75g/day  Fluid:  1.3-1.5L/day  Koleen Distance MS, RD, LDN Please refer to [facility] for RD and/or RD on-call/weekend/after hours pager

## 2021-06-13 NOTE — Progress Notes (Signed)
Inpatient Diabetes Program Recommendations  AACE/ADA: New Consensus Statement on Inpatient Glycemic Control   Target Ranges:  Prepandial:   less than 140 mg/dL      Peak postprandial:   less than 180 mg/dL (1-2 hours)      Critically ill patients:  140 - 180 mg/dL   Results for Angela Jensen, Angela Jensen (MRN [id number]) as of 06/13/2021 10:07  Ref. Range 06/12/2021 08:39 06/12/2021 12:18 06/12/2021 16:41 06/12/2021 21:03 06/13/2021 07:28 06/13/2021 08:39  Glucose-Capillary Latest Ref Range: 70 - 99 mg/dL 179 (H) 118 (H) 233 (H) 152 (H) 69 (L) 111 (H)    Review of Glycemic Control  Diabetes history: DM2 Outpatient Diabetes medications: Levemir 8-12 units daily, Metformin 1000 mg daily Current orders for Inpatient glycemic control: Levemir 14 units QHS, Novolog 0-20 units TID with meals, Novolog 0-5 units QHS, Novolog 6 units TID with meals for meal coverage, Metformin 500 mg QAM; Solumedrol 60 mg Q24H  Inpatient Diabetes Program Recommendations:    Insulin: Fasting glucose 69 mg/dl today. Noted patient did NOT receive any meal coverage on 06/12/21 due to parameters not being met. Please consider decreasing Levemir to 10 units QHS.  Thanks, Barnie Alderman, RN, MSN, CDE Diabetes Coordinator Inpatient Diabetes Program [phone] (Team Pager from 8am to 5pm)

## 2021-06-13 NOTE — Telephone Encounter (Signed)
Unable to [facility] phone rings once and then dead air. Pt needs to know Alyssa was thinking about Breztri and we have samples if she would like to try that.

## 2021-06-14 DIAGNOSIS — E0800 Diabetes mellitus due to underlying condition with hyperosmolarity without nonketotic hyperglycemic-hyperosmolar coma (NKHHC): Secondary | ICD-10-CM | POA: Diagnosis not present

## 2021-06-14 DIAGNOSIS — I5022 Chronic systolic (congestive) heart failure: Secondary | ICD-10-CM | POA: Diagnosis not present

## 2021-06-14 DIAGNOSIS — J9621 Acute and chronic respiratory failure with hypoxia: Secondary | ICD-10-CM | POA: Diagnosis not present

## 2021-06-14 DIAGNOSIS — J441 Chronic obstructive pulmonary disease with (acute) exacerbation: Secondary | ICD-10-CM | POA: Diagnosis not present

## 2021-06-14 LAB — GLUCOSE, CAPILLARY
Glucose-Capillary: 153 mg/dL — ABNORMAL HIGH (ref 70–99)
Glucose-Capillary: 84 mg/dL (ref 70–99)

## 2021-06-14 MED ORDER — PREDNISONE 20 MG PO TABS: 40.0000 mg | ORAL_TABLET | Freq: Every day | ORAL | Status: DC

## 2021-06-14 MED ORDER — AMOXICILLIN-POT CLAVULANATE 875-125 MG PO TABS: 1.0000 | ORAL_TABLET | Freq: Two times a day (BID) | ORAL | 0 refills | Status: AC

## 2021-06-14 MED ORDER — FAMOTIDINE 20 MG PO TABS: 20.0000 mg | ORAL_TABLET | Freq: Every day | ORAL | 0 refills | Status: DC

## 2021-06-14 MED ORDER — PREDNISONE 20 MG PO TABS: 40.0000 mg | ORAL_TABLET | Freq: Every day | ORAL | 0 refills | Status: AC

## 2021-06-14 NOTE — Discharge Summary (Signed)
Angela Jensen JZP:[id number] DOB: Feb 15, 1956 DOA: 06/11/2021  PCP: [name], MD  Admit date: 06/11/2021 Discharge date: 06/14/2021  Admitted From: home Disposition:  home  Recommendations for Outpatient Follow-up:  Follow up with PCP in 1 week Please obtain BMP/CBC in one week      Discharge Condition:Stable CODE STATUS:full  Diet recommendation: carb modified   Brief/Interim Summary: Per HPI:The patient is a 65 yr old woman who states that she began having shortness of breath at 10:30 this am. She denies fevers, chills, production of purulent sputum, or cough. She does have chest tightness with deep respirations. She has a past medical history significant for Cancer of the Right Lung, Chronic hypoxic respiratory failure, HFREF, COPD, DM II, Hypertension, Hyperlipidemia, and PAD.  In the ED she was found to require 4L to maintain saturations in the 90's. Baseline O2 requirements at home are 2L by [place]. CXR demonstrated Emphysema, and possible multifocal pneumonia She was admitted for treatment of pneumonia and an acute exacerbation of her COPD.  Acute exacerbation of chronic obstructive pulmonary disease (COPD) (HCC) Clinically improved at baseline O2 requirement Was treated with IV steroids on IV antibiotics Continue I-S and flutter valves at home     Pneumonia Treated with IV antibiotics, will discharge with p.o. antibiotics to complete course   Acute on chronic respiratory failure with hypoxia (HCC) Baseline oxygen requirements are 2.5L by [place]  Treated for above pulmonary issues        Essential hypertension Continue home Coreg and lisinopril   Chronic systolic CHF (congestive heart failure) (HCC) Echocardiogram in 03/31/2021 has demonstrated EF of 45-50% with global hypokinesis.  Currently euvolemic, no acute exacerbation     Type II diabetes ([place]) with hyperglycemia Continue home meds       Discharge Diagnoses:  Active Problems:   Essential  hypertension   Diabetes (HCC)   Hyperlipidemia   Acute on chronic respiratory failure with hypoxia (HCC)   Acute exacerbation of chronic obstructive pulmonary disease (COPD) (HCC)   Chronic systolic CHF (congestive heart failure) ([place])    Discharge Instructions  Discharge Instructions     Diet - low sodium heart healthy   Complete by: As directed    Diet Carb Modified   Complete by: As directed    Discharge instructions   Complete by: As directed    Follow up with your pcp next week Dont take mobic until you finish prednisone. Use incentive spirometer   Increase activity slowly   Complete by: As directed       Allergies as of 06/14/2021   No Known Allergies      Medication List     STOP taking these medications    meloxicam 7.5 MG tablet Commonly known as: MOBIC       TAKE these medications    Accu-Chek Guide test strip Generic drug: glucose blood USE  STRIP TO CHECK GLUCOSE TWICE DAILY   Accu-Chek Guide w/Device Kit Use as directed Dx e11.65   Accu-Chek Softclix Lancets lancets Use as instructed for twice a daily Dx E11.65   albuterol 108 (90 Base) MCG/ACT inhaler Commonly known as: VENTOLIN HFA Inhale 2 puffs into the lungs every 6 (six) hours as needed for wheezing or shortness of breath. What changed: Another medication with the same name was removed. Continue taking this medication, and follow the directions you see here.   ALPRAZolam 0.25 MG tablet Commonly known as: XANAX Take 1 tablet (0.25 mg total) by mouth 3 (three) times daily  as needed for anxiety or sleep.   amoxicillin-clavulanate 875-125 MG tablet Commonly known as: Augmentin Take 1 tablet by mouth every 12 (twelve) hours for 5 days.   carvedilol 3.125 MG tablet Commonly known as: COREG Take 1 tablet (3.125 mg total) by mouth 2 (two) times daily with a meal.   clopidogrel 75 MG tablet Commonly known as: Plavix Take 1 tablet (75 mg total) by mouth daily.   escitalopram 10 MG  tablet Commonly known as: LEXAPRO Take 1 tablet (10 mg total) by mouth daily.   famotidine 20 MG tablet Commonly known as: PEPCID Take 1 tablet (20 mg total) by mouth daily for 14 days.   ferrous sulfate 325 (65 FE) MG tablet Take 325 mg by mouth 2 (two) times daily with a meal. What changed: Another medication with the same name was removed. Continue taking this medication, and follow the directions you see here.   Fish Oil 1000 MG Caps Take 1 capsule by mouth daily.   fluticasone 50 MCG/ACT nasal spray Commonly known as: FLONASE Place 2 sprays into both nostrils daily.   gabapentin 100 MG capsule Commonly known as: NEURONTIN Take 1 capsule (100 mg total) by mouth 2 (two) times daily.   Levemir FlexTouch 100 UNIT/ML FlexPen Generic drug: insulin detemir Inject 8- 12 units with supper qd// with needles   lisinopril 10 MG tablet Commonly known as: ZESTRIL Take 1 tablet (10 mg total) by mouth daily.   metFORMIN 500 MG tablet Commonly known as: GLUCOPHAGE Take 1 tablet (500 mg total) by mouth daily with breakfast.   OXYGEN Inhale into the lungs.   potassium chloride 10 MEQ tablet Commonly known as: KLOR-CON TAKE 1 TABLET BY MOUTH EVERY OTHER DAY   predniSONE 20 MG tablet Commonly known as: DELTASONE Take 2 tablets (40 mg total) by mouth daily with breakfast for 3 days. Start taking on: June 15, 2021   Trelegy Ellipta 100-62.5-25 MCG/ACT Aepb Generic drug: Fluticasone-Umeclidin-Vilant Inhale 1 puff into the lungs daily.        Follow-up Information     [name], MD Follow up in 1 week(s).   Specialties: Internal Medicine, Cardiology Contact information: [address] [address] [phone]                No Known Allergies  Consultations:    Procedures/Studies: DG Chest Portable 1 View  Result Date: 06/11/2021 CLINICAL DATA:  Shortness of breath, wheezing. EXAM: PORTABLE CHEST 1 VIEW COMPARISON:  Chest radiograph  05/06/2021; CT chest 05/01/2021 FINDINGS: Upper lobe predominant emphysematous changes with reticulation at the lung bases. Interstitial and patchy airspace opacities in the mid and lower lungs, right greater than left. No pleural effusion or pneumothorax. Scarring at the right apex. Heart is normal in size. No acute osseous abnormality. IMPRESSION: Interstitial and patchy airspace opacities in the mid and lower lungs, right-greater-than-left. Findings are concerning for multifocal pneumonia in the appropriate clinical context. Emphysema (ICD10-J43.9). Electronically Signed   By: Ileana Roup M.D.   On: 06/11/2021 14:20   VAS [name] ABI WITH/WO TBI  Result Date: 05/30/2021  LOWER EXTREMITY DOPPLER STUDY Patient Name:  KAILY WRAGG  Date of Exam:   05/27/2021 Medical Rec #: [id number]     Accession #:    [id number] Date of Birth: 1955-10-28     Patient Gender: F Patient Age:   42 years Exam Location:  [facility] Procedure:      VAS [name] ABI WITH/WO TBI Referring Phys:  JASON DEW --------------------------------------------------------------------------------  Indications: Peripheral artery disease, and 09/29/2018 left SFA to pop PTA and              stents.  Vascular Interventions: 06/01/16: PTA/stent of left distal SFA/popliteal                         arteries;                         06/08/16: PTA of left SFA. Comparison Study: 11/22/2020 Performing Technologist: Charlane Ferretti RT (R)(VS)  Examination Guidelines: A complete evaluation includes at minimum, Doppler waveform signals and systolic blood pressure reading at the level of bilateral brachial, anterior tibial, and posterior tibial arteries, when vessel segments are accessible. Bilateral testing is considered an integral part of a complete examination. Photoelectric Plethysmograph (PPG) waveforms and toe systolic pressure readings are included as required and additional duplex testing as needed. Limited examinations for reoccurring indications may be  performed as noted.  ABI Findings: +---------+------------------+-----+----------+--------+ Right    Rt Pressure (mmHg)IndexWaveform  Comment  +---------+------------------+-----+----------+--------+ Brachial 156                                       +---------+------------------+-----+----------+--------+ ATA      116                    monophasic         +---------+------------------+-----+----------+--------+ PTA      131               0.84 monophasic         +---------+------------------+-----+----------+--------+ Ellen Henri               0.72 Abnormal           +---------+------------------+-----+----------+--------+ +---------+------------------+-----+----------+-------+ Left     Lt Pressure (mmHg)IndexWaveform  Comment +---------+------------------+-----+----------+-------+ Brachial 156                                      +---------+------------------+-----+----------+-------+ ATA      109                    monophasic        +---------+------------------+-----+----------+-------+ PTA      107               0.69 monophasic        +---------+------------------+-----+----------+-------+ Great Toe69                0.44 Abnormal          +---------+------------------+-----+----------+-------+ +-------+-----------+-----------+------------+------------+ ABI/TBIToday's ABIToday's TBIPrevious ABIPrevious TBI +-------+-----------+-----------+------------+------------+ Right  .84        .72        .90         .81          +-------+-----------+-----------+------------+------------+ Left   .70        .44        .79         .68          +-------+-----------+-----------+------------+------------+ Bilateral ABIs appear essentially unchanged compared to prior study on 11/22/2020. Bilateral TBIs appear essentially unchanged compared to prior study on 11/22/2020.  Summary: Right: Resting right ankle-brachial index indicates mild right lower extremity  arterial disease. The right  toe-brachial index is normal. Left: Resting left ankle-brachial index indicates moderate left lower extremity arterial disease. The left toe-brachial index is abnormal. Left TBI sppears slightly decreased as compared to the previous exam on 11/22/2020.  *See table(s) above for measurements and observations.  Electronically signed by [name] MD on 05/30/2021 at 8:51:47 AM.    Final       Subjective: She feels much better.  Feels her shortness of breath is at baseline.  No chest pain.  Discharge Exam: Vitals:   06/14/21 0735 06/14/21 0739  BP:  (!) 148/89  Pulse:  84  Resp:  18  Temp:  98.6 F (37 C)  SpO2: 100% 100%   Vitals:   06/13/21 2046 06/14/21 0417 06/14/21 0735 06/14/21 0739  BP:  (!) 142/80  (!) 148/89  Pulse:  72  84  Resp:  16  18  Temp:  97.8 F (36.6 C)  98.6 F (37 C)  TempSrc:  Oral    SpO2: 97% 100% 100% 100%  Weight:      Height:        General: Pt is alert, awake, not in acute distress Cardiovascular: RRR, S1/S2 +, no rubs, no gallops Respiratory: CTA bilaterally, no wheezing, no rhonchi Abdominal: Soft, NT, ND, bowel sounds + Extremities: no edema, no cyanosis    The results of significant diagnostics from this hospitalization (including imaging, microbiology, ancillary and laboratory) are listed below for reference.     Microbiology: Recent Results (from the past 240 hour(s))  Resp Panel by RT-PCR (Flu A&B, Covid) Nasopharyngeal Swab     Status: None   Collection Time: 06/11/21  1:44 PM   Specimen: Nasopharyngeal Swab; Nasopharyngeal(NP) swabs in vial transport medium  Result Value Ref Range Status   SARS Coronavirus 2 by RT PCR NEGATIVE NEGATIVE Final    Comment: (NOTE) SARS-CoV-2 target nucleic acids are NOT DETECTED.  The SARS-CoV-2 RNA is generally detectable in upper respiratory specimens during the acute phase of infection. The lowest concentration of SARS-CoV-2 viral copies this assay can detect is 138  copies/mL. A negative result does not preclude SARS-Cov-2 infection and should not be used as the sole basis for treatment or other patient management decisions. A negative result may occur with  improper specimen collection/handling, submission of specimen other than nasopharyngeal swab, presence of viral mutation(s) within the areas targeted by this assay, and inadequate number of viral copies(<138 copies/mL). A negative result must be combined with clinical observations, patient history, and epidemiological information. The expected result is Negative.  Fact Sheet for Patients:  EntrepreneurPulse.com.au  Fact Sheet for Healthcare Providers:  IncredibleEmployment.be  This test is no t yet approved or cleared by the [place] FDA and  has been authorized for detection and/or diagnosis of SARS-CoV-2 by FDA under an Emergency Use Authorization (EUA). This EUA will remain  in effect (meaning this test can be used) for the duration of the COVID-19 declaration under Section 564(b)(1) of the Act, 21 U.S.C.section 360bbb-3(b)(1), unless the authorization is terminated  or revoked sooner.       Influenza A by PCR NEGATIVE NEGATIVE Final   Influenza B by PCR NEGATIVE NEGATIVE Final    Comment: (NOTE) The Xpert Xpress SARS-CoV-2/FLU/RSV plus assay is intended as an aid in the diagnosis of influenza from Nasopharyngeal swab specimens and should not be used as a sole basis for treatment. Nasal washings and aspirates are unacceptable for Xpert Xpress SARS-CoV-2/FLU/RSV testing.  Fact Sheet for Patients: EntrepreneurPulse.com.au  Fact Sheet for Healthcare  Providers: IncredibleEmployment.be  This test is not yet approved or cleared by the [place] and has been authorized for detection and/or diagnosis of SARS-CoV-2 by FDA under an Emergency Use Authorization (EUA). This EUA will remain in effect (meaning  this test can be used) for the duration of the COVID-19 declaration under Section 564(b)(1) of the Act, 21 U.S.C. section 360bbb-3(b)(1), unless the authorization is terminated or revoked.  Performed at [facility], [address] [place], [place] [address]   MRSA Next Gen by PCR, Nasal     Status: None   Collection Time: 06/13/21  8:00 AM   Specimen: Nasal Mucosa; Nasal Swab  Result Value Ref Range Status   MRSA by PCR Next Gen NOT DETECTED NOT DETECTED Final    Comment: (NOTE) The GeneXpert MRSA Assay (FDA approved for NASAL specimens only), is one component of a comprehensive MRSA colonization surveillance program. It is not intended to diagnose MRSA infection nor to guide or monitor treatment for MRSA infections. Test performance is not FDA approved in patients less than 69 years old. Performed at [facility], [address] [place], [place] [address]      Labs: BNP (last 3 results) Recent Labs    04/30/21 2147 05/01/21 2021  BNP 29.3 01.0   Basic Metabolic Panel: Recent Labs  Lab 06/11/21 1344 06/12/21 0529  NA 140 136  K 4.4 3.8  CL 104 101  CO2 25 26  GLUCOSE 187* 275*  BUN 16 21  CREATININE 0.82 0.70  CALCIUM 8.6* 8.5*   Liver Function Tests: No results for input(s): AST, ALT, ALKPHOS, BILITOT, PROT, ALBUMIN in the last 168 hours. No results for input(s): LIPASE, AMYLASE in the last 168 hours. No results for input(s): AMMONIA in the last 168 hours. CBC: Recent Labs  Lab 06/11/21 1344 06/12/21 0529  WBC 4.3 7.4  NEUTROABS 1.8 5.9  HGB 12.5 10.3*  HCT 41.2 32.8*  MCV 96.0 94.5  PLT 268 211   Cardiac Enzymes: No results for input(s): CKTOTAL, CKMB, CKMBINDEX, TROPONINI in the last 168 hours. BNP: Invalid input(s): POCBNP CBG: Recent Labs  Lab 06/13/21 1132 06/13/21 1554 06/13/21 2027 06/14/21 0738 06/14/21 1153  GLUCAP 149* 258* 201* 153* 84   D-Dimer No results for input(s): DDIMER in the last 72 hours. Hgb  A1c Recent Labs    06/11/21 1344  HGBA1C 6.6*   Lipid Profile No results for input(s): CHOL, HDL, LDLCALC, TRIG, CHOLHDL, LDLDIRECT in the last 72 hours. Thyroid function studies No results for input(s): TSH, T4TOTAL, T3FREE, THYROIDAB in the last 72 hours.  Invalid input(s): FREET3 Anemia work up No results for input(s): VITAMINB12, FOLATE, FERRITIN, TIBC, IRON, RETICCTPCT in the last 72 hours. Urinalysis    Component Value Date/Time   COLORURINE YELLOW (A) 06/30/2020 1325   APPEARANCEUR Clear 07/29/2020 1137   LABSPEC 1.023 06/30/2020 1325   PHURINE 5.0 06/30/2020 1325   GLUCOSEU Negative 07/29/2020 1137   HGBUR NEGATIVE 06/30/2020 1325   BILIRUBINUR negative 04/22/2021 0959   BILIRUBINUR Negative 07/29/2020 1137   KETONESUR 5 (A) 06/30/2020 1325   PROTEINUR Positive (A) 04/22/2021 0959   PROTEINUR Negative 07/29/2020 1137   PROTEINUR NEGATIVE 06/30/2020 1325   UROBILINOGEN 0.2 04/22/2021 0959   NITRITE neg 04/22/2021 0959   NITRITE Negative 07/29/2020 1137   NITRITE NEGATIVE 06/30/2020 1325   LEUKOCYTESUR Negative 04/22/2021 0959   LEUKOCYTESUR Negative 07/29/2020 1137   LEUKOCYTESUR NEGATIVE 06/30/2020 1325   Sepsis Labs Invalid input(s): PROCALCITONIN,  WBC,  [place] [facility] Recent Results (from the past 240 hour(s))  Resp Panel by RT-PCR (Flu A&B, Covid) Nasopharyngeal Swab     Status: None   Collection Time: 06/11/21  1:44 PM   Specimen: Nasopharyngeal Swab; Nasopharyngeal(NP) swabs in vial transport medium  Result Value Ref Range Status   SARS Coronavirus 2 by RT PCR NEGATIVE NEGATIVE Final    Comment: (NOTE) SARS-CoV-2 target nucleic acids are NOT DETECTED.  The SARS-CoV-2 RNA is generally detectable in upper respiratory specimens during the acute phase of infection. The lowest concentration of SARS-CoV-2 viral copies this assay can detect is 138 copies/mL. A negative result does not preclude SARS-Cov-2 infection and should not be used as the  sole basis for treatment or other patient management decisions. A negative result may occur with  improper specimen collection/handling, submission of specimen other than nasopharyngeal swab, presence of viral mutation(s) within the areas targeted by this assay, and inadequate number of viral copies(<138 copies/mL). A negative result must be combined with clinical observations, patient history, and epidemiological information. The expected result is Negative.  Fact Sheet for Patients:  EntrepreneurPulse.com.au  Fact Sheet for Healthcare Providers:  IncredibleEmployment.be  This test is no t yet approved or cleared by the [place] FDA and  has been authorized for detection and/or diagnosis of SARS-CoV-2 by FDA under an Emergency Use Authorization (EUA). This EUA will remain  in effect (meaning this test can be used) for the duration of the COVID-19 declaration under Section 564(b)(1) of the Act, 21 U.S.C.section 360bbb-3(b)(1), unless the authorization is terminated  or revoked sooner.       Influenza A by PCR NEGATIVE NEGATIVE Final   Influenza B by PCR NEGATIVE NEGATIVE Final    Comment: (NOTE) The Xpert Xpress SARS-CoV-2/FLU/RSV plus assay is intended as an aid in the diagnosis of influenza from Nasopharyngeal swab specimens and should not be used as a sole basis for treatment. Nasal washings and aspirates are unacceptable for Xpert Xpress SARS-CoV-2/FLU/RSV testing.  Fact Sheet for Patients: EntrepreneurPulse.com.au  Fact Sheet for Healthcare Providers: IncredibleEmployment.be  This test is not yet approved or cleared by the [place] FDA and has been authorized for detection and/or diagnosis of SARS-CoV-2 by FDA under an Emergency Use Authorization (EUA). This EUA will remain in effect (meaning this test can be used) for the duration of the COVID-19 declaration under Section 564(b)(1) of the  Act, 21 U.S.C. section 360bbb-3(b)(1), unless the authorization is terminated or revoked.  Performed at [facility], [address] [place], [place] [address]   MRSA Next Gen by PCR, Nasal     Status: None   Collection Time: 06/13/21  8:00 AM   Specimen: Nasal Mucosa; Nasal Swab  Result Value Ref Range Status   MRSA by PCR Next Gen NOT DETECTED NOT DETECTED Final    Comment: (NOTE) The GeneXpert MRSA Assay (FDA approved for NASAL specimens only), is one component of a comprehensive MRSA colonization surveillance program. It is not intended to diagnose MRSA infection nor to guide or monitor treatment for MRSA infections. Test performance is not FDA approved in patients less than 75 years old. Performed at [facility], [address] [place], [place] [address]      Time coordinating discharge: Over 30 minutes  SIGNED:   Nolberto Hanlon, MD  [facility] 06/14/2021, 12:56 PM Pager   If 7PM-7AM, please contact night-coverage [email or web address] Password [email or web address]

## 2021-06-14 NOTE — Progress Notes (Signed)
Mobility Specialist - Progress Note   06/14/21 1100  Mobility  Activity Ambulated in hall  Level of Assistance Independent  Assistive Device None  Distance Ambulated (ft) 360 ft  Mobility Ambulated independently in hallway  Mobility Response Tolerated well  Mobility performed by Mobility specialist  $Mobility charge 1 Mobility    Pre-mobility: 101 HR, 98% SpO2 During mobility: 116-124 HR, 96% SpO2 Post-mobility: 112 HR, 96% SpO2   Pt ambulated in hallway without assist, able to navigate portable O2 by self per pt request. No LOB. Denied SOB on 3L. Mildly fatigue. Pt returned to EOB with needs in reach. Back on 2.5L prior to exit.    Kathee Delton Mobility Specialist 06/14/21, 12:15 PM

## 2021-06-16 NOTE — Telephone Encounter (Signed)
Check in with patient, see if she looked into breztri, we can give samples if she wants to try. If not or if she tries and does not like breztri, I will switch her back to her old inhalers.

## 2021-06-16 NOTE — Telephone Encounter (Signed)
LMOM to see how pt is doing and to see if she wanted to try a different inhaler or be back to her old inhalers

## 2021-06-17 ENCOUNTER — Ambulatory Visit (INDEPENDENT_AMBULATORY_CARE_PROVIDER_SITE_OTHER): Payer: Medicare Other | Admitting: Nurse Practitioner

## 2021-06-17 ENCOUNTER — Other Ambulatory Visit: Payer: Self-pay

## 2021-06-17 ENCOUNTER — Encounter: Payer: Self-pay | Admitting: Nurse Practitioner

## 2021-06-17 VITALS — BP 140/76 | HR 85 | Temp 98.2°F | Resp 16 | Ht 59.0 in | Wt 120.0 lb

## 2021-06-17 DIAGNOSIS — F419 Anxiety disorder, unspecified: Secondary | ICD-10-CM

## 2021-06-17 DIAGNOSIS — J189 Pneumonia, unspecified organism: Secondary | ICD-10-CM | POA: Diagnosis not present

## 2021-06-17 DIAGNOSIS — J9611 Chronic respiratory failure with hypoxia: Secondary | ICD-10-CM | POA: Diagnosis not present

## 2021-06-17 DIAGNOSIS — J449 Chronic obstructive pulmonary disease, unspecified: Secondary | ICD-10-CM

## 2021-06-17 MED ORDER — SPIRIVA HANDIHALER 18 MCG IN CAPS: ORAL_CAPSULE | RESPIRATORY_TRACT | 0 refills | Status: DC

## 2021-06-17 MED ORDER — MOMETASONE FURO-FORMOTEROL FUM 200-5 MCG/ACT IN AERO
2.0000 | INHALATION_SPRAY | Freq: Two times a day (BID) | RESPIRATORY_TRACT | 5 refills | Status: DC
Start: 2021-06-17 — End: 2022-06-19

## 2021-06-17 MED ORDER — ALPRAZOLAM 0.25 MG PO TABS: 0.2500 mg | ORAL_TABLET | Freq: Three times a day (TID) | ORAL | 0 refills | Status: DC | PRN

## 2021-06-17 NOTE — Progress Notes (Signed)
[facility] [place] [address] [facility] Discharge Acute Issues Care Follow Up                                                                        Patient Demographics  Angela Jensen, is a 65 y.o. female  DOB [birth year]  MRN [id number].  Primary MD  [name], MD   Reason for TCC follow Up - Pneumonia and acute on chronic respiratory failure with hypoxia   Past Medical History:  Diagnosis Date   Anemia    Arthritis    Asthma    Atherosclerosis of native arteries of extremity with intermittent claudication ([place]) 05/26/2016   Cancer ([place]) 2012   Right Lung CA   COPD (chronic obstructive pulmonary disease) ([place])    Depression    Diabetes mellitus without complication ([place])    Patient takes Janumet   Essential hypertension 05/26/2016   Hypercholesteremia    Oxygen dependent    2L at nite    PAD (peripheral artery disease) ([place]) 06/22/2016   Peripheral vascular disease (HCC)    Personal history of radiation therapy    Shortness of breath dyspnea    with exertion    Sialolithiasis    Sleep apnea    Wears dentures    full upper and lower    Past Surgical History:  Procedure Laterality Date   CESAREAN SECTION     x3   COLONOSCOPY WITH PROPOFOL N/A 06/25/2015   Procedure: COLONOSCOPY WITH PROPOFOL;  Surgeon: Lucilla Lame, MD;  Location: [facility];  Service: Endoscopy;  Laterality: N/A;   COLONOSCOPY WITH PROPOFOL N/A 07/26/2020   Procedure: COLONOSCOPY WITH PROPOFOL;  Surgeon: Lucilla Lame, MD;  Location: [place];  Service: Endoscopy;  Laterality: N/A;   CYST REMOVAL LEG     and on shoulder    ESOPHAGOGASTRODUODENOSCOPY (EGD) WITH PROPOFOL N/A 07/26/2020   Procedure: ESOPHAGOGASTRODUODENOSCOPY (EGD) WITH PROPOFOL;  Surgeon: Lucilla Lame, MD;  Location: [place];  Service: Endoscopy;  Laterality: N/A;   Diabetic - oral meds   LOWER EXTREMITY ANGIOGRAPHY Left 09/29/2018   Procedure: LOWER EXTREMITY ANGIOGRAPHY;  Surgeon: Algernon Huxley, MD;  Location: [place] [facility];  Service: Cardiovascular;  Laterality: Left;   LUNG BIOPSY  05 15 2013   has lung "spots"   PERIPHERAL VASCULAR CATHETERIZATION Left 06/01/2016   Procedure: Lower Extremity Angiography;  Surgeon: Algernon Huxley, MD;  Location: [place] [facility];  Service: Cardiovascular;  Laterality: Left;   PERIPHERAL VASCULAR CATHETERIZATION N/A 06/01/2016   Procedure: Abdominal Aortogram w/Lower Extremity;  Surgeon: Algernon Huxley, MD;  Location: [place] [facility];  Service: Cardiovascular;  Laterality: N/A;   PERIPHERAL VASCULAR CATHETERIZATION  06/01/2016   Procedure: Lower Extremity Intervention;  Surgeon: Algernon Huxley, MD;  Location: [place] [facility];  Service: Cardiovascular;;   PERIPHERAL VASCULAR CATHETERIZATION Right 06/08/2016   Procedure: Lower Extremity Angiography;  Surgeon: Algernon Huxley, MD;  [facility];  Service: Cardiovascular;  Laterality: Right;   PERIPHERAL VASCULAR CATHETERIZATION  06/08/2016   Procedure: Lower Extremity Intervention;  Surgeon: Algernon Huxley, MD;  Location: [place] [facility];  Service: Cardiovascular;;   SUBMANDIBULAR GLAND EXCISION Left 12/06/2020   Procedure: EXCISION SUBMANDIBULAR GLAND;  Surgeon: Beverly Gust, MD;  Location: [place];  Service: ENT;  Laterality: Left;  needs to be first case Diabetic - diet controlled       Recent HPI and Hospital Course  The patient is a 65 yr old woman who states that she began having shortness of breath at 10:30 this am. She denies fevers, chills, production of purulent sputum, or cough. She does have chest tightness with deep respirations. She has a past medical history significant for Cancer of the Right Lung, Chronic hypoxic respiratory failure, HFREF, COPD, DM II, Hypertension, Hyperlipidemia, and PAD.  In the ED she was  found to require 4L to maintain saturations in the 90's. Baseline O2 requirements at home are 2L by . CXR demonstrated Emphysema, and possible multifocal pneumonia She was admitted for treatment of pneumonia and an acute exacerbation of her COPD.  [facility] Acute Care Issue to be followed in the Clinic    Essential hypertension   Diabetes ([place])   Hyperlipidemia   Acute on chronic respiratory failure with hypoxia (HCC)   Acute exacerbation of chronic obstructive pulmonary disease (COPD) (HCC)   Chronic systolic CHF (congestive heart failure) (HCC)   Subjective:   [name] today has slight SOB and lingering cough She denies any headache, chest pain, abdominal pain , Nausea,new weakness tingling or numbness.   Assessment & Plan   1. Chronic obstructive pulmonary disease, unspecified COPD type ([place]) Trelegy inhaler discontinued, patient wants to go back to the inhalers she was on before.  - tiotropium (SPIRIVA HANDIHALER) 18 MCG inhalation capsule; INHALE THE CONTENTS OF ONE CAPSULE BY MOUTH ONCE DAILY. DO NOT SWALLOW CAPSULE.  Dispense: 90 capsule; Refill: 0 - mometasone-formoterol (DULERA) 200-5 MCG/ACT AERO; Inhale 2 puffs into the lungs 2 (two) times daily.  Dispense: 13 g; Refill: 5  2. Chronic respiratory failure with hypoxia (HCC) This is an ongoing problem. She has had multiple hospitalizations in the past.   3. Community acquired pneumonia, unspecified laterality Treated while in the hospital with intravenous antibiotics.   4. Anxiety Refill ordered.  - ALPRAZolam (XANAX) 0.25 MG tablet; Take 1 tablet (0.25 mg total) by mouth 3 (three) times daily as needed for anxiety or sleep.  Dispense: 30 tablet; Refill: 0    Reason for frequent admissions/ER visits   COPD exacerbation Chronic respiratory failure with hypoxia      Objective:   Vitals:   06/17/21 1107  BP: 140/76  Pulse: 85  Resp: 16  Temp: 98.2 F (36.8 C)  SpO2: 95%  Weight: 120 lb (54.4 kg)   Height: '4\' 11"'  (1.499 m)    Wt Readings from Last 3 Encounters:  07/03/21 128 lb 6.4 oz (58.2 kg)  07/02/21 125 lb 9.6 oz (57 kg)  07/01/21 126 lb (57.2 kg)    Allergies as of 06/17/2021       Reactions   Trelegy Ellipta [fluticasone-umeclidin-vilant]         Medication List        Accurate as of June 17, 2021 11:59 PM. If you have any questions, ask your nurse or doctor.          STOP taking these medications    Trelegy  Ellipta 100-62.5-25 MCG/ACT Aepb Generic drug: Fluticasone-Umeclidin-Vilant Stopped by: Jonetta Osgood, NP       TAKE these medications    Accu-Chek Guide test strip Generic drug: glucose blood USE  STRIP TO CHECK GLUCOSE TWICE DAILY   Accu-Chek Guide w/Device Kit Use as directed Dx e11.65   Accu-Chek Softclix Lancets lancets Use as instructed for twice a daily Dx E11.65   albuterol 108 (90 Base) MCG/ACT inhaler Commonly known as: VENTOLIN HFA Inhale 2 puffs into the lungs every 6 (six) hours as needed for wheezing or shortness of breath.   ALPRAZolam 0.25 MG tablet Commonly known as: XANAX Take 1 tablet (0.25 mg total) by mouth 3 (three) times daily as needed for anxiety or sleep.   amoxicillin-clavulanate 875-125 MG tablet Commonly known as: Augmentin Take 1 tablet by mouth every 12 (twelve) hours for 5 days.   carvedilol 3.125 MG tablet Commonly known as: COREG Take 1 tablet (3.125 mg total) by mouth 2 (two) times daily with a meal.   clopidogrel 75 MG tablet Commonly known as: Plavix Take 1 tablet (75 mg total) by mouth daily.   escitalopram 10 MG tablet Commonly known as: LEXAPRO Take 1 tablet (10 mg total) by mouth daily.   famotidine 20 MG tablet Commonly known as: PEPCID Take 1 tablet (20 mg total) by mouth daily for 14 days.   ferrous sulfate 325 (65 FE) MG tablet Take 325 mg by mouth 2 (two) times daily with a meal.   Fish Oil 1000 MG Caps Take 1 capsule by mouth daily.   fluticasone 50 MCG/ACT nasal  spray Commonly known as: FLONASE Place 2 sprays into both nostrils daily.   gabapentin 100 MG capsule Commonly known as: NEURONTIN Take 1 capsule (100 mg total) by mouth 2 (two) times daily.   Levemir FlexTouch 100 UNIT/ML FlexPen Generic drug: insulin detemir Inject 8- 12 units with supper qd// with needles   lisinopril 10 MG tablet Commonly known as: ZESTRIL Take 1 tablet (10 mg total) by mouth daily.   metFORMIN 500 MG tablet Commonly known as: GLUCOPHAGE Take 1 tablet (500 mg total) by mouth daily with breakfast.   mometasone-formoterol 200-5 MCG/ACT Aero Commonly known as: DULERA Inhale 2 puffs into the lungs 2 (two) times daily. Started by: Jonetta Osgood, NP   OXYGEN Inhale into the lungs.   potassium chloride 10 MEQ tablet Commonly known as: KLOR-CON TAKE 1 TABLET BY MOUTH EVERY OTHER DAY   predniSONE 20 MG tablet Commonly known as: DELTASONE Take 2 tablets (40 mg total) by mouth daily with breakfast for 3 days.   Spiriva HandiHaler 18 MCG inhalation capsule Generic drug: tiotropium INHALE THE CONTENTS OF ONE CAPSULE BY MOUTH ONCE DAILY. DO NOT SWALLOW CAPSULE. Started by: Jonetta Osgood, NP         Physical Exam: Constitutional: Patient appears well-developed and well-nourished. Not in obvious distress. HENT: Normocephalic, atraumatic, External right and left ear normal. Oropharynx is clear and moist.  Eyes: Conjunctivae and EOM are normal. PERRLA, no scleral icterus. Neck: Normal ROM. Neck supple. No JVD. No tracheal deviation. No thyromegaly. CVS: RRR, S1/S2 +, no murmurs, no gallops, no carotid bruit.  Pulmonary: Effort and breath sounds normal, no stridor, rhonchi, wheezes, rales.  Abdominal: Soft. BS +, no distension, tenderness, rebound or guarding.  Musculoskeletal: Normal range of motion. No edema and no tenderness.  Lymphadenopathy: No lymphadenopathy noted, cervical, inguinal or axillary Neuro: Alert. Normal reflexes, muscle tone  coordination. No cranial nerve deficit. Skin: Skin is warm and  dry. No rash noted. Not diaphoretic. No erythema. No pallor. Psychiatric: Normal mood and affect. Behavior, judgment, thought content normal.   Data Review   Micro Results Recent Results (from the past 240 hour(s))  Resp Panel by RT-PCR (Flu A&B, Covid) Nasopharyngeal Swab     Status: None   Collection Time: 06/26/21 11:41 AM   Specimen: Nasopharyngeal Swab; Nasopharyngeal(NP) swabs in vial transport medium  Result Value Ref Range Status   SARS Coronavirus 2 by RT PCR NEGATIVE NEGATIVE Final    Comment: (NOTE) SARS-CoV-2 target nucleic acids are NOT DETECTED.  The SARS-CoV-2 RNA is generally detectable in upper respiratory specimens during the acute phase of infection. The lowest concentration of SARS-CoV-2 viral copies this assay can detect is 138 copies/mL. A negative result does not preclude SARS-Cov-2 infection and should not be used as the sole basis for treatment or other patient management decisions. A negative result may occur with  improper specimen collection/handling, submission of specimen other than nasopharyngeal swab, presence of viral mutation(s) within the areas targeted by this assay, and inadequate number of viral copies(<138 copies/mL). A negative result must be combined with clinical observations, patient history, and epidemiological information. The expected result is Negative.  Fact Sheet for Patients:  EntrepreneurPulse.com.au  Fact Sheet for Healthcare Providers:  IncredibleEmployment.be  This test is no t yet approved or cleared by the [place] FDA and  has been authorized for detection and/or diagnosis of SARS-CoV-2 by FDA under an Emergency Use Authorization (EUA). This EUA will remain  in effect (meaning this test can be used) for the duration of the COVID-19 declaration under Section 564(b)(1) of the Act, 21 U.S.C.section 360bbb-3(b)(1), unless  the authorization is terminated  or revoked sooner.       Influenza A by PCR NEGATIVE NEGATIVE Final   Influenza B by PCR NEGATIVE NEGATIVE Final    Comment: (NOTE) The Xpert Xpress SARS-CoV-2/FLU/RSV plus assay is intended as an aid in the diagnosis of influenza from Nasopharyngeal swab specimens and should not be used as a sole basis for treatment. Nasal washings and aspirates are unacceptable for Xpert Xpress SARS-CoV-2/FLU/RSV testing.  Fact Sheet for Patients: EntrepreneurPulse.com.au  Fact Sheet for Healthcare Providers: IncredibleEmployment.be  This test is not yet approved or cleared by the [place] FDA and has been authorized for detection and/or diagnosis of SARS-CoV-2 by FDA under an Emergency Use Authorization (EUA). This EUA will remain in effect (meaning this test can be used) for the duration of the COVID-19 declaration under Section 564(b)(1) of the Act, 21 U.S.C. section 360bbb-3(b)(1), unless the authorization is terminated or revoked.  Performed at [facility], [address] [place], [place] [address]   Blood culture (routine x 2)     Status: None   Collection Time: 06/26/21  2:01 PM   Specimen: BLOOD  Result Value Ref Range Status   Specimen Description BLOOD RIGHT ANTECUBITAL  Final   Special Requests   Final    BOTTLES DRAWN AEROBIC AND ANAEROBIC Blood Culture adequate volume   Culture   Final    NO GROWTH 5 DAYS Performed at [facility], [address] [place], [place] [address]    Report Status 07/01/2021 FINAL  Final  Blood culture (routine x 2)     Status: None   Collection Time: 06/26/21  2:01 PM   Specimen: BLOOD  Result Value Ref Range Status   Specimen Description BLOOD LEFT ANTECUBITAL  Final   Special Requests   Final  BOTTLES DRAWN AEROBIC AND ANAEROBIC Blood Culture adequate volume   Culture   Final    NO GROWTH 5 DAYS Performed at [facility], [address] [place], [place] [address]    Report Status 07/01/2021 FINAL  Final     CBC No results for input(s): WBC, HGB, HCT, PLT, MCV, MCH, MCHC, RDW, LYMPHSABS, MONOABS, EOSABS, BASOSABS, BANDABS in the last 168 hours.  Invalid input(s): NEUTRABS, BANDSABD   Chemistries  No results for input(s): NA, K, CL, CO2, GLUCOSE, BUN, CREATININE, CALCIUM, MG, AST, ALT, ALKPHOS, BILITOT in the last 168 hours.  Invalid input(s): GFRCGP  ------------------------------------------------------------------------------------------------------------------ estimated creatinine clearance is 52.8 mL/min (by C-G formula based on SCr of 0.66 mg/dL). ------------------------------------------------------------------------------------------------------------------ No results for input(s): HGBA1C in the last 72 hours. ------------------------------------------------------------------------------------------------------------------ No results for input(s): CHOL, HDL, LDLCALC, TRIG, CHOLHDL, LDLDIRECT in the last 72 hours. ------------------------------------------------------------------------------------------------------------------ No results for input(s): TSH, T4TOTAL, T3FREE, THYROIDAB in the last 72 hours.  Invalid input(s): FREET3 ------------------------------------------------------------------------------------------------------------------ No results for input(s): VITAMINB12, FOLATE, FERRITIN, TIBC, IRON, RETICCTPCT in the last 72 hours.  Coagulation profile No results for input(s): INR, PROTIME in the last 168 hours.  No results for input(s): DDIMER in the last 72 hours.  Cardiac Enzymes No results for input(s): CKMB, TROPONINI, MYOGLOBIN in the last 168 hours.  Invalid input(s): CK ------------------------------------------------------------------------------------------------------------------ Invalid input(s): POCBNP  Return in about 1 month (around 07/17/2021) for F/U, eval new med,  [place] PCP.  Time Spent in minutes  45 Time spent with patient included reviewing progress notes, labs, imaging studies, and discussing plan for follow up.   This patient was seen by Jonetta Osgood, FNP-C in collaboration with Dr. Clayborn Bigness as a part of collaborative care agreement.   Jonetta Osgood MSN, FNP-C on 07/05/2021 at 6:50 PM   **Disclaimer: This note may have been dictated with voice recognition software. Similar sounding words can inadvertently be transcribed and this note may contain transcription errors which may not have been corrected upon publication of note.**

## 2021-06-19 ENCOUNTER — Other Ambulatory Visit: Payer: Medicare Other

## 2021-06-19 ENCOUNTER — Ambulatory Visit: Payer: Medicare Other

## 2021-06-21 ENCOUNTER — Other Ambulatory Visit: Payer: Self-pay | Admitting: Internal Medicine

## 2021-06-26 ENCOUNTER — Encounter: Payer: Self-pay | Admitting: Emergency Medicine

## 2021-06-26 ENCOUNTER — Other Ambulatory Visit: Payer: Self-pay | Admitting: Internal Medicine

## 2021-06-26 ENCOUNTER — Other Ambulatory Visit: Payer: Self-pay

## 2021-06-26 ENCOUNTER — Inpatient Hospital Stay
Admission: EM | Admit: 2021-06-26 | Discharge: 2021-06-29 | DRG: 194 | Disposition: A | Discharge status: Home / Self Care | Payer: Medicare Other | Attending: Internal Medicine | Admitting: Internal Medicine

## 2021-06-26 ENCOUNTER — Emergency Department: Payer: Medicare Other

## 2021-06-26 ENCOUNTER — Ambulatory Visit: Payer: Medicare Other | Admitting: Oncology

## 2021-06-26 ENCOUNTER — Inpatient Hospital Stay: Payer: Medicare Other

## 2021-06-26 DIAGNOSIS — Z9981 Dependence on supplemental oxygen: Secondary | ICD-10-CM | POA: Diagnosis not present

## 2021-06-26 DIAGNOSIS — F419 Anxiety disorder, unspecified: Secondary | ICD-10-CM | POA: Diagnosis present

## 2021-06-26 DIAGNOSIS — G4730 Sleep apnea, unspecified: Secondary | ICD-10-CM | POA: Diagnosis present

## 2021-06-26 DIAGNOSIS — I5022 Chronic systolic (congestive) heart failure: Secondary | ICD-10-CM | POA: Diagnosis present

## 2021-06-26 DIAGNOSIS — E7800 Pure hypercholesterolemia, unspecified: Secondary | ICD-10-CM | POA: Diagnosis present

## 2021-06-26 DIAGNOSIS — Z794 Long term (current) use of insulin: Secondary | ICD-10-CM

## 2021-06-26 DIAGNOSIS — J441 Chronic obstructive pulmonary disease with (acute) exacerbation: Secondary | ICD-10-CM

## 2021-06-26 DIAGNOSIS — Z79899 Other long term (current) drug therapy: Secondary | ICD-10-CM

## 2021-06-26 DIAGNOSIS — E119 Type 2 diabetes mellitus without complications: Secondary | ICD-10-CM | POA: Diagnosis not present

## 2021-06-26 DIAGNOSIS — Z87891 Personal history of nicotine dependence: Secondary | ICD-10-CM | POA: Diagnosis not present

## 2021-06-26 DIAGNOSIS — E1151 Type 2 diabetes mellitus with diabetic peripheral angiopathy without gangrene: Secondary | ICD-10-CM | POA: Diagnosis present

## 2021-06-26 DIAGNOSIS — Z7902 Long term (current) use of antithrombotics/antiplatelets: Secondary | ICD-10-CM

## 2021-06-26 DIAGNOSIS — Z8249 Family history of ischemic heart disease and other diseases of the circulatory system: Secondary | ICD-10-CM | POA: Diagnosis not present

## 2021-06-26 DIAGNOSIS — Z923 Personal history of irradiation: Secondary | ICD-10-CM | POA: Diagnosis not present

## 2021-06-26 DIAGNOSIS — Z23 Encounter for immunization: Secondary | ICD-10-CM

## 2021-06-26 DIAGNOSIS — Z888 Allergy status to other drugs, medicaments and biological substances status: Secondary | ICD-10-CM | POA: Diagnosis not present

## 2021-06-26 DIAGNOSIS — I110 Hypertensive heart disease with heart failure: Secondary | ICD-10-CM | POA: Diagnosis present

## 2021-06-26 DIAGNOSIS — F32A Depression, unspecified: Secondary | ICD-10-CM | POA: Diagnosis present

## 2021-06-26 DIAGNOSIS — Z83438 Family history of other disorder of lipoprotein metabolism and other lipidemia: Secondary | ICD-10-CM

## 2021-06-26 DIAGNOSIS — Z7984 Long term (current) use of oral hypoglycemic drugs: Secondary | ICD-10-CM

## 2021-06-26 DIAGNOSIS — J449 Chronic obstructive pulmonary disease, unspecified: Secondary | ICD-10-CM

## 2021-06-26 DIAGNOSIS — I447 Left bundle-branch block, unspecified: Secondary | ICD-10-CM | POA: Diagnosis present

## 2021-06-26 DIAGNOSIS — Z801 Family history of malignant neoplasm of trachea, bronchus and lung: Secondary | ICD-10-CM

## 2021-06-26 DIAGNOSIS — Z20822 Contact with and (suspected) exposure to covid-19: Secondary | ICD-10-CM | POA: Diagnosis present

## 2021-06-26 DIAGNOSIS — J9611 Chronic respiratory failure with hypoxia: Secondary | ICD-10-CM

## 2021-06-26 DIAGNOSIS — I5042 Chronic combined systolic (congestive) and diastolic (congestive) heart failure: Secondary | ICD-10-CM

## 2021-06-26 DIAGNOSIS — Z85118 Personal history of other malignant neoplasm of bronchus and lung: Secondary | ICD-10-CM | POA: Diagnosis not present

## 2021-06-26 DIAGNOSIS — J189 Pneumonia, unspecified organism: Secondary | ICD-10-CM | POA: Diagnosis present

## 2021-06-26 DIAGNOSIS — I10 Essential (primary) hypertension: Secondary | ICD-10-CM | POA: Diagnosis present

## 2021-06-26 DIAGNOSIS — Z833 Family history of diabetes mellitus: Secondary | ICD-10-CM

## 2021-06-26 DIAGNOSIS — J440 Chronic obstructive pulmonary disease with acute lower respiratory infection: Secondary | ICD-10-CM | POA: Diagnosis present

## 2021-06-26 DIAGNOSIS — C3411 Malignant neoplasm of upper lobe, right bronchus or lung: Secondary | ICD-10-CM | POA: Diagnosis not present

## 2021-06-26 LAB — COMPREHENSIVE METABOLIC PANEL
ALT: 38 U/L (ref 0–44)
AST: 40 U/L (ref 15–41)
Albumin: 3.8 g/dL (ref 3.5–5.0)
Alkaline Phosphatase: 70 U/L (ref 38–126)
Anion gap: 8 (ref 5–15)
BUN: 23 mg/dL (ref 8–23)
CO2: 27 mmol/L (ref 22–32)
Calcium: 9 mg/dL (ref 8.9–10.3)
Chloride: 102 mmol/L (ref 98–111)
Creatinine, Ser: 0.62 mg/dL (ref 0.44–1.00)
GFR, Estimated: >60 mL/min (ref >60)
Glucose, Bld: 178 mg/dL — ABNORMAL HIGH (ref 70–99)
Potassium: 3.8 mmol/L (ref 3.5–5.1)
Sodium: 137 mmol/L (ref 135–145)
Total Bilirubin: 0.6 mg/dL (ref 0.3–1.2)
Total Protein: 6.9 g/dL (ref 6.5–8.1)

## 2021-06-26 LAB — URINALYSIS, ROUTINE W REFLEX MICROSCOPIC
Bacteria, UA: NONE SEEN
Bilirubin Urine: NEGATIVE
Glucose, UA: NEGATIVE mg/dL
Ketones, ur: NEGATIVE mg/dL
Leukocytes,Ua: NEGATIVE
Nitrite: NEGATIVE
Protein, ur: 100 mg/dL — AB
Specific Gravity, Urine: 1.013 (ref 1.005–1.030)
pH: 5 (ref 5.0–8.0)

## 2021-06-26 LAB — BLOOD GAS, VENOUS
Acid-Base Excess: 2.3 mmol/L — ABNORMAL HIGH (ref 0.0–2.0)
Bicarbonate: 28.7 mmol/L — ABNORMAL HIGH (ref 20.0–28.0)
O2 Saturation: 88.7 %
Patient temperature: 37
pCO2, Ven: 52 mmHg (ref 44.0–60.0)
pH, Ven: 7.35 (ref 7.250–7.430)
pO2, Ven: 59 mmHg — ABNORMAL HIGH (ref 32.0–45.0)

## 2021-06-26 LAB — CBC WITH DIFFERENTIAL/PLATELET
Abs Immature Granulocytes: 0.02 10*3/uL (ref 0.00–0.07)
Basophils Absolute: 0 10*3/uL (ref 0.0–0.1)
Basophils Relative: 0 %
Eosinophils Absolute: 0.2 10*3/uL (ref 0.0–0.5)
Eosinophils Relative: 3 %
HCT: 35.3 % — ABNORMAL LOW (ref 36.0–46.0)
Hemoglobin: 10.8 g/dL — ABNORMAL LOW (ref 12.0–15.0)
Immature Granulocytes: 0 %
Lymphocytes Relative: 15 %
Lymphs Abs: 0.8 10*3/uL (ref 0.7–4.0)
MCH: 29.3 pg (ref 26.0–34.0)
MCHC: 30.6 g/dL (ref 30.0–36.0)
MCV: 95.7 fL (ref 80.0–100.0)
Monocytes Absolute: 0.6 10*3/uL (ref 0.1–1.0)
Monocytes Relative: 11 %
Neutro Abs: 3.7 10*3/uL (ref 1.7–7.7)
Neutrophils Relative %: 71 %
Platelets: 207 10*3/uL (ref 150–400)
RBC: 3.69 MIL/uL — ABNORMAL LOW (ref 3.87–5.11)
RDW: 13.2 % (ref 11.5–15.5)
WBC: 5.3 10*3/uL (ref 4.0–10.5)
nRBC: 0 % (ref 0.0–0.2)

## 2021-06-26 LAB — GLUCOSE, CAPILLARY
Glucose-Capillary: 343 mg/dL — ABNORMAL HIGH (ref 70–99)
Glucose-Capillary: 229 mg/dL — ABNORMAL HIGH (ref 70–99)

## 2021-06-26 LAB — RESP PANEL BY RT-PCR (FLU A&B, COVID) ARPGX2
Influenza A by PCR: NEGATIVE
Influenza B by PCR: NEGATIVE
SARS Coronavirus 2 by RT PCR: NEGATIVE

## 2021-06-26 MED ORDER — INSULIN DETEMIR 100 UNIT/ML ~~LOC~~ SOLN
12.0000 [IU] | Freq: Every day | SUBCUTANEOUS | Status: DC
  Administered 2021-06-26 – 2021-06-28 (×3): 12 [IU] via SUBCUTANEOUS
  Filled 2021-06-26 (×4): qty 0.12

## 2021-06-26 MED ORDER — ENOXAPARIN SODIUM 40 MG/0.4ML IJ SOSY
40.0000 mg | PREFILLED_SYRINGE | INTRAMUSCULAR | Status: DC
  Administered 2021-06-26 – 2021-06-28 (×3): 40 mg via SUBCUTANEOUS
  Filled 2021-06-26 (×3): qty 0.4

## 2021-06-26 MED ORDER — PREDNISONE 20 MG PO TABS: 40.0000 mg | ORAL_TABLET | Freq: Every day | ORAL | Status: DC

## 2021-06-26 MED ORDER — ACETAMINOPHEN 650 MG RE SUPP: 650.0000 mg | Freq: Four times a day (QID) | RECTAL | Status: DC | PRN

## 2021-06-26 MED ORDER — DOXYCYCLINE HYCLATE 100 MG PO TABS
100.0000 mg | ORAL_TABLET | Freq: Two times a day (BID) | ORAL | Status: DC
  Administered 2021-06-26 – 2021-06-29 (×6): 100 mg via ORAL
  Filled 2021-06-26 (×6): qty 1

## 2021-06-26 MED ORDER — ONDANSETRON HCL 4 MG/2ML IJ SOLN: 4.0000 mg | Freq: Four times a day (QID) | INTRAMUSCULAR | Status: DC | PRN

## 2021-06-26 MED ORDER — ESCITALOPRAM OXALATE 10 MG PO TABS
10.0000 mg | ORAL_TABLET | Freq: Every day | ORAL | Status: DC
  Administered 2021-06-27 – 2021-06-29 (×3): 10 mg via ORAL
  Filled 2021-06-26 (×3): qty 1

## 2021-06-26 MED ORDER — CARVEDILOL 6.25 MG PO TABS
3.1250 mg | ORAL_TABLET | Freq: Two times a day (BID) | ORAL | Status: DC
  Administered 2021-06-26 – 2021-06-29 (×6): 3.125 mg via ORAL
  Filled 2021-06-26 (×2): qty 0.5
  Filled 2021-06-26: qty 1
  Filled 2021-06-26 (×3): qty 0.5
  Filled 2021-06-26: qty 1
  Filled 2021-06-26 (×2): qty 0.5

## 2021-06-26 MED ORDER — FAMOTIDINE 20 MG PO TABS
20.0000 mg | ORAL_TABLET | Freq: Every day | ORAL | Status: DC
  Administered 2021-06-26 – 2021-06-29 (×4): 20 mg via ORAL
  Filled 2021-06-26 (×4): qty 1

## 2021-06-26 MED ORDER — LISINOPRIL 10 MG PO TABS
10.0000 mg | ORAL_TABLET | Freq: Every day | ORAL | Status: DC
  Administered 2021-06-27 – 2021-06-29 (×3): 10 mg via ORAL
  Filled 2021-06-26 (×3): qty 1

## 2021-06-26 MED ORDER — INFLUENZA VAC A&B SA ADJ QUAD 0.5 ML IM PRSY
0.5000 mL | PREFILLED_SYRINGE | INTRAMUSCULAR | Status: AC
  Administered 2021-06-27: 0.5 mL via INTRAMUSCULAR
  Filled 2021-06-26: qty 0.5

## 2021-06-26 MED ORDER — ONDANSETRON HCL 4 MG PO TABS: 4.0000 mg | ORAL_TABLET | Freq: Four times a day (QID) | ORAL | Status: DC | PRN

## 2021-06-26 MED ORDER — IOHEXOL 350 MG/ML SOLN
75.0000 mL | Freq: Once | INTRAVENOUS | Status: AC | PRN
  Administered 2021-06-26: 75 mL via INTRAVENOUS

## 2021-06-26 MED ORDER — OMEGA-3-ACID ETHYL ESTERS 1 G PO CAPS
1.0000 g | ORAL_CAPSULE | Freq: Every day | ORAL | Status: DC
  Administered 2021-06-27 – 2021-06-29 (×3): 1 g via ORAL
  Filled 2021-06-26 (×3): qty 1

## 2021-06-26 MED ORDER — INSULIN ASPART 100 UNIT/ML IJ SOLN
0.0000 [IU] | Freq: Three times a day (TID) | INTRAMUSCULAR | Status: DC
  Administered 2021-06-26: 11 [IU] via SUBCUTANEOUS
  Administered 2021-06-27 (×2): 5 [IU] via SUBCUTANEOUS
  Administered 2021-06-27: 8 [IU] via SUBCUTANEOUS
  Filled 2021-06-26 (×4): qty 1

## 2021-06-26 MED ORDER — LEVOFLOXACIN IN D5W 750 MG/150ML IV SOLN
750.0000 mg | Freq: Once | INTRAVENOUS | Status: AC
  Administered 2021-06-26: 750 mg via INTRAVENOUS
  Filled 2021-06-26: qty 150

## 2021-06-26 MED ORDER — ALPRAZOLAM 0.25 MG PO TABS
0.2500 mg | ORAL_TABLET | Freq: Three times a day (TID) | ORAL | Status: DC | PRN
  Administered 2021-06-26 – 2021-06-28 (×5): 0.25 mg via ORAL
  Filled 2021-06-26 (×5): qty 1

## 2021-06-26 MED ORDER — IPRATROPIUM-ALBUTEROL 0.5-2.5 (3) MG/3ML IN SOLN: 3.0000 mL | Freq: Four times a day (QID) | RESPIRATORY_TRACT | Status: DC | PRN

## 2021-06-26 MED ORDER — METHYLPREDNISOLONE SODIUM SUCC 40 MG IJ SOLR
40.0000 mg | Freq: Two times a day (BID) | INTRAMUSCULAR | Status: AC
  Administered 2021-06-26 – 2021-06-27 (×2): 40 mg via INTRAVENOUS
  Filled 2021-06-26 (×2): qty 1

## 2021-06-26 MED ORDER — FLUTICASONE PROPIONATE 50 MCG/ACT NA SUSP
2.0000 | Freq: Every day | NASAL | Status: DC
Start: 2021-06-27 — End: 2021-06-29
  Administered 2021-06-27 – 2021-06-29 (×3): 2 via NASAL
  Filled 2021-06-26: qty 16

## 2021-06-26 MED ORDER — MOMETASONE FURO-FORMOTEROL FUM 200-5 MCG/ACT IN AERO
2.0000 | INHALATION_SPRAY | Freq: Two times a day (BID) | RESPIRATORY_TRACT | Status: DC
  Administered 2021-06-26 – 2021-06-29 (×6): 2 via RESPIRATORY_TRACT
  Filled 2021-06-26: qty 8.8

## 2021-06-26 MED ORDER — GABAPENTIN 100 MG PO CAPS
100.0000 mg | ORAL_CAPSULE | Freq: Two times a day (BID) | ORAL | Status: DC
  Administered 2021-06-26 – 2021-06-29 (×6): 100 mg via ORAL
  Filled 2021-06-26 (×6): qty 1

## 2021-06-26 MED ORDER — ACETAMINOPHEN 325 MG PO TABS
650.0000 mg | ORAL_TABLET | Freq: Four times a day (QID) | ORAL | Status: DC | PRN
  Filled 2021-06-26: qty 2

## 2021-06-26 MED ORDER — FERROUS SULFATE 325 (65 FE) MG PO TABS
325.0000 mg | ORAL_TABLET | Freq: Two times a day (BID) | ORAL | Status: DC
  Administered 2021-06-26 – 2021-06-29 (×6): 325 mg via ORAL
  Filled 2021-06-26 (×6): qty 1

## 2021-06-26 MED ORDER — IPRATROPIUM-ALBUTEROL 0.5-2.5 (3) MG/3ML IN SOLN
3.0000 mL | Freq: Once | RESPIRATORY_TRACT | Status: AC
  Administered 2021-06-26: 3 mL via RESPIRATORY_TRACT
  Filled 2021-06-26: qty 3

## 2021-06-26 MED ORDER — CLOPIDOGREL BISULFATE 75 MG PO TABS
75.0000 mg | ORAL_TABLET | Freq: Every day | ORAL | Status: DC
  Administered 2021-06-27 – 2021-06-29 (×3): 75 mg via ORAL
  Filled 2021-06-26 (×3): qty 1

## 2021-06-26 NOTE — ED Triage Notes (Signed)
Pt arrives via [facility] states that she is on 3L O2 chronically d/t COPD and this morning she was having difficulty breathing, took a breathing tx with no relief.  EMS states O2 sat of 79% upon arrival, gave solumedrol and albuterol neb then sat went up to 96% on 3L. Pt AOx4 and in NAD at this time.

## 2021-06-26 NOTE — H&P (Signed)
History and Physical    Angela Jensen MRN:[id number] DOB: 11/15/1955 DOA: 06/26/2021  PCP: Khan, Fozia M, MD   Patient coming from: Home  I have personally briefly reviewed patient's old medical records in [facility]  Chief Complaint: Shortness of breath  HPI: Angela Jensen is a 65 y.o. female with medical history significant for COPD with chronic respiratory failure on 2.5 l of oxygen continuous, history of Lung CA (  RUL adenocarcinoma s/p radiation 2013 and 2021), depressison, DM and PAD who presents to the ER for evaluation of SOB. Patient was recently discharged from the hospital after treatment for multifocal pneumonia and acute exacerbation of her COPD.  She states that post discharge she has done well until the morning of her admission when she developed shortness of breath worse with exertion and she checked her pulse oximetry and had pulse oximetry on 2.5 l of oxygen of 79%.  She increased her oxygen supplementation to 3 L without any significant improvement and so she called EMS.  She complains of a cough which is nonproductive and has had episodes of feeling very warm as well as episodes of cold chills.  She denies having any sick contacts.  She denies having any chest pain, no nausea, no vomiting, no abdominal pain, no headache, no dizziness, no lightheadedness, no changes in her bowel habits, no urinary symptoms, no fatigue, no weakness, no blurred vision or focal deficit. VBG 7.35/52/59/28.7/88.7 Sodium 137, potassium 3.8, chloride 102, bicarb 27, glucose 178, BUN 23, creatinine 0.62, calcium 9.0, alkaline phosphatase 70, albumin 3.8, AST 40, ALT 38, total protein 6.9, white count 5.3, hemoglobin 10.8, hematocrit 35.3, MCV 95.7, RDW 13.2, platelet count 207 Respiratory viral panel is negative Chest x-ray reviewed by me shows persistent but improved pneumonia at the right greater than left lung bases. Twelve-lead EKG reviewed by me shows sinus rhythm with a left bundle branch  block.    ED Course: Patient is a 65-year-old female who presents to the ER for evaluation of sudden onset shortness of breath worse from her baseline associated with a nonproductive cough and wheezing. At baseline she wears 2.5 L of oxygen continuous but had to increase her oxygen supplementation to 3 L due to a low pulse oximetry of 78% recorded on her pulse oximeter. Chest x-ray shows improvement in her multifocal pneumonia. She will be admitted to the hospital for further evaluation.   Review of Systems: As per HPI otherwise all other systems reviewed and negative.    Past Medical History:  Diagnosis Date   Anemia    Arthritis    Asthma    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/2016   Cancer (HCC) 2012   Right Lung CA   COPD (chronic obstructive pulmonary disease) (HCC)    Depression    Diabetes mellitus without complication (HCC)    Patient takes Janumet   Essential hypertension 05/26/2016   Hypercholesteremia    Oxygen dependent    2L at nite    PAD (peripheral artery disease) (HCC) 06/22/2016   Peripheral vascular disease (HCC)    Personal history of radiation therapy    Shortness of breath dyspnea    with exertion    Sialolithiasis    Sleep apnea    Wears dentures    full upper and lower    Past Surgical History:  Procedure Laterality Date   CESAREAN SECTION     x3   COLONOSCOPY WITH PROPOFOL N/A 06/25/2015     Procedure: COLONOSCOPY WITH PROPOFOL;  Surgeon: Lucilla Lame, MD;  Location: [facility];  Service: Endoscopy;  Laterality: N/A;   COLONOSCOPY WITH PROPOFOL N/A 07/26/2020   Procedure: COLONOSCOPY WITH PROPOFOL;  Surgeon: Lucilla Lame, MD;  Location: [facility];  Service: Endoscopy;  Laterality: N/A;   CYST REMOVAL LEG     and on shoulder    ESOPHAGOGASTRODUODENOSCOPY (EGD) WITH PROPOFOL N/A 07/26/2020   Procedure: ESOPHAGOGASTRODUODENOSCOPY (EGD) WITH PROPOFOL;  Surgeon: Lucilla Lame, MD;   Location: [facility];  Service: Endoscopy;  Laterality: N/A;  Diabetic - oral meds   LOWER EXTREMITY ANGIOGRAPHY Left 09/29/2018   Procedure: LOWER EXTREMITY ANGIOGRAPHY;  Surgeon: Algernon Huxley, MD;  Location: [facility];  Service: Cardiovascular;  Laterality: Left;   LUNG BIOPSY  05 15 2013   has lung "spots"   PERIPHERAL VASCULAR CATHETERIZATION Left 06/01/2016   Procedure: Lower Extremity Angiography;  Surgeon: Algernon Huxley, MD;  Location: [facility];  Service: Cardiovascular;  Laterality: Left;   PERIPHERAL VASCULAR CATHETERIZATION N/A 06/01/2016   Procedure: Abdominal Aortogram w/Lower Extremity;  Surgeon: Algernon Huxley, MD;  Location: [facility];  Service: Cardiovascular;  Laterality: N/A;   PERIPHERAL VASCULAR CATHETERIZATION  06/01/2016   Procedure: Lower Extremity Intervention;  Surgeon: Algernon Huxley, MD;  Location: [facility];  Service: Cardiovascular;;   PERIPHERAL VASCULAR CATHETERIZATION Right 06/08/2016   Procedure: Lower Extremity Angiography;  Surgeon: Algernon Huxley, MD;  Location: [facility];  Service: Cardiovascular;  Laterality: Right;   PERIPHERAL VASCULAR CATHETERIZATION  06/08/2016   Procedure: Lower Extremity Intervention;  Surgeon: Algernon Huxley, MD;  Location: [facility];  Service: Cardiovascular;;   SUBMANDIBULAR GLAND EXCISION Left 12/06/2020   Procedure: EXCISION SUBMANDIBULAR GLAND;  Surgeon: Beverly Gust, MD;  Location: [facility];  Service: ENT;  Laterality: Left;  needs to be first case Diabetic - diet controlled     reports that she quit smoking about 11 years ago. Her smoking use included cigarettes. She has a 37.00 pack-year smoking history. She has quit using smokeless tobacco.  Her smokeless tobacco use included snuff. She reports that she does not currently use alcohol after a past usage of about 5.0 standard drinks per week. She reports that she does not currently use drugs after having  used the following drugs: Marijuana, "Crack" cocaine, and Cocaine.  Allergies  Allergen Reactions   Trelegy Ellipta [Fluticasone-Umeclidin-Vilant]     Family History  Problem Relation Age of Onset   Lung cancer Father    Diabetes Mother    Hypercholesterolemia Mother    Diabetes Sister    Diabetes Maternal Grandmother    Diabetes Paternal Grandmother    Diabetes Sister    Heart attack Brother    Coronary artery disease Brother    Vascular Disease Brother    Hypertension Sister    Heart attack Brother       Prior to Admission medications   Medication Sig Start Date End Date Taking? Authorizing Provider  Accu-Chek Softclix Lancets lancets Use as instructed for twice a daily Dx E11.65 04/11/21   [name], MD  albuterol (VENTOLIN HFA) 108 (90 Base) MCG/ACT inhaler INHALE 2 PUFFS BY MOUTH EVERY 6 HOURS AS NEEDED FOR WHEEZING FOR SHORTNESS OF BREATH 06/26/21   [name], MD  ALPRAZolam Duanne Moron) 0.25 MG tablet Take 1 tablet (0.25 mg total) by mouth 3 (three) times daily as needed for anxiety or sleep. 06/17/21  Abernathy, Alyssa, NP  Blood Glucose Monitoring Suppl (ACCU-CHEK GUIDE) w/Device KIT Use as directed Dx e11.65 04/11/21   Khan, Fozia M, MD  carvedilol (COREG) 3.125 MG tablet Take 1 tablet (3.125 mg total) by mouth 2 (two) times daily with a meal. 05/08/21   Griffith, Kelly A, DO  clopidogrel (PLAVIX) 75 MG tablet Take 1 tablet by mouth once daily 06/22/21   Abernathy, Alyssa, NP  escitalopram (LEXAPRO) 10 MG tablet Take 1 tablet (10 mg total) by mouth daily. 05/08/21   Griffith, Kelly A, DO  famotidine (PEPCID) 20 MG tablet Take 1 tablet (20 mg total) by mouth daily for 14 days. 06/14/21 06/28/21  Amery, Sahar, MD  ferrous sulfate 325 (65 FE) MG tablet Take 325 mg by mouth 2 (two) times daily with a meal.    [provider]  fluticasone (FLONASE) 50 MCG/ACT nasal spray Place 2 sprays into both nostrils daily. 05/09/21   Griffith, Kelly A, DO  gabapentin (NEURONTIN)  100 MG capsule Take 1 capsule (100 mg total) by mouth 2 (two) times daily. 04/08/21   Abernathy, Alyssa, NP  glucose blood (ACCU-CHEK GUIDE) test strip USE  STRIP TO CHECK GLUCOSE TWICE DAILY 06/08/21   Khan, Fozia M, MD  insulin detemir (LEVEMIR FLEXTOUCH) 100 UNIT/ML FlexPen Inject 8- 12 units with supper qd// with needles 04/22/21   Khan, Fozia M, MD  lisinopril (ZESTRIL) 10 MG tablet Take 1 tablet (10 mg total) by mouth daily. 05/09/21   Griffith, Kelly A, DO  metFORMIN (GLUCOPHAGE) 500 MG tablet Take 1 tablet (500 mg total) by mouth daily with breakfast. 03/12/21   Khan, Fozia M, MD  mometasone-formoterol (DULERA) 200-5 MCG/ACT AERO Inhale 2 puffs into the lungs 2 (two) times daily. 06/17/21   Abernathy, Alyssa, NP  Omega-3 Fatty Acids (FISH OIL) 1000 MG CAPS Take 1 capsule by mouth daily.    [provider]  OXYGEN Inhale into the lungs.    [provider]  potassium chloride (KLOR-CON) 10 MEQ tablet TAKE 1 TABLET BY MOUTH EVERY OTHER DAY 04/29/21   Khan, Fozia M, MD  tiotropium (SPIRIVA HANDIHALER) 18 MCG inhalation capsule INHALE THE CONTENTS OF ONE CAPSULE BY MOUTH ONCE DAILY. DO NOT SWALLOW CAPSULE. 06/17/21   Abernathy, Alyssa, NP    Physical Exam: Vitals:   06/26/21 1137 06/26/21 1300 06/26/21 1400 06/26/21 1500  BP:  (!) 121/92 (!) 144/86 129/89  Pulse:  85 91 92  Resp:  (!) 21 16 12  Temp:      TempSrc:      SpO2:  99% 97% 97%  Weight: 54.4 kg     Height: 4' 11" (1.499 m)        Vitals:   06/26/21 1137 06/26/21 1300 06/26/21 1400 06/26/21 1500  BP:  (!) 121/92 (!) 144/86 129/89  Pulse:  85 91 92  Resp:  (!) 21 16 12  Temp:      TempSrc:      SpO2:  99% 97% 97%  Weight: 54.4 kg     Height: 4' 11" (1.499 m)         Constitutional: Alert and oriented x 3 .  Appears comfortable and not in any apparent distress HEENT:      Head: Normocephalic and atraumatic.         Eyes: PERLA, EOMI, Conjunctivae are normal. Sclera is non-icteric.       Mouth/Throat:  Mucous membranes are moist.       Neck: Supple with no signs of meningismus.   Cardiovascular: Regular rate and rhythm. No murmurs, gallops, or rubs. 2+ symmetrical distal pulses are present . No JVD. No LE edema Respiratory: Respiratory effort normal .Lungs sounds clear bilaterally.  Faint wheezes, no crackles, or rhonchi.  Gastrointestinal: Soft, non tender, and non distended with positive bowel sounds.  Genitourinary: No CVA tenderness. Musculoskeletal: Nontender with normal range of motion in all extremities. No cyanosis, or erythema of extremities. Neurologic:  Face is symmetric. Moving all extremities. No gross focal neurologic deficits . Skin: Skin is warm, dry.  No rash or ulcers Psychiatric: Mood and affect are normal    Labs on Admission: I have personally reviewed following labs and imaging studies  CBC: Recent Labs  Lab 06/26/21 1141  WBC 5.3  NEUTROABS 3.7  HGB 10.8*  HCT 35.3*  MCV 95.7  PLT 030   Basic Metabolic Panel: Recent Labs  Lab 06/26/21 1141  NA 137  K 3.8  CL 102  CO2 27  GLUCOSE 178*  BUN 23  CREATININE 0.62  CALCIUM 9.0   GFR: Estimated Creatinine Clearance: 52.8 mL/min (by C-G formula based on SCr of 0.62 mg/dL). Liver Function Tests: Recent Labs  Lab 06/26/21 1141  AST 40  ALT 38  ALKPHOS 70  BILITOT 0.6  PROT 6.9  ALBUMIN 3.8   No results for input(s): LIPASE, AMYLASE in the last 168 hours. No results for input(s): AMMONIA in the last 168 hours. Coagulation Profile: No results for input(s): INR, PROTIME in the last 168 hours. Cardiac Enzymes: No results for input(s): CKTOTAL, CKMB, CKMBINDEX, TROPONINI in the last 168 hours. BNP (last 3 results) No results for input(s): PROBNP in the last 8760 hours. HbA1C: No results for input(s): HGBA1C in the last 72 hours. CBG: No results for input(s): GLUCAP in the last 168 hours. Lipid Profile: No results for input(s): CHOL, HDL, LDLCALC, TRIG, CHOLHDL, LDLDIRECT in the last 72  hours. Thyroid Function Tests: No results for input(s): TSH, T4TOTAL, FREET4, T3FREE, THYROIDAB in the last 72 hours. Anemia Panel: No results for input(s): VITAMINB12, FOLATE, FERRITIN, TIBC, IRON, RETICCTPCT in the last 72 hours. Urine analysis:    Component Value Date/Time   COLORURINE YELLOW (A) 06/26/2021 1401   APPEARANCEUR CLEAR (A) 06/26/2021 1401   APPEARANCEUR Clear 07/29/2020 1137   LABSPEC 1.013 06/26/2021 1401   PHURINE 5.0 06/26/2021 1401   GLUCOSEU NEGATIVE 06/26/2021 1401   HGBUR SMALL (A) 06/26/2021 1401   BILIRUBINUR NEGATIVE 06/26/2021 1401   BILIRUBINUR negative 04/22/2021 0959   BILIRUBINUR Negative 07/29/2020 1137   KETONESUR NEGATIVE 06/26/2021 1401   PROTEINUR 100 (A) 06/26/2021 1401   UROBILINOGEN 0.2 04/22/2021 0959   NITRITE NEGATIVE 06/26/2021 1401   LEUKOCYTESUR NEGATIVE 06/26/2021 1401    Radiological Exams on Admission: DG Chest 2 View  Result Date: 06/26/2021 CLINICAL DATA:  Shortness of breath EXAM: CHEST - 2 VIEW COMPARISON:  06/11/2021 FINDINGS: Background emphysema. Persistent but improved opacities at the right greater than left lung bases. No significant pleural effusion. No pneumothorax. Stable cardiomediastinal contours with normal heart size. IMPRESSION: Persistent but improved pneumonia at the right greater than left lung bases. Electronically Signed   By: Macy Mis M.D.   On: 06/26/2021 13:24     Assessment/Plan Principal Problem:   Acute exacerbation of chronic obstructive pulmonary disease (COPD) ([place]) Active Problems:   Malignant neoplasm of upper lobe of right lung (HCC)   Essential hypertension   Diabetes (HCC)   Chronic systolic CHF (congestive heart failure) ([place])   CAP (community acquired pneumonia)  Patient is a 65 year old female who presents to the ER for evaluation of sudden onset shortness of breath associated with nonproductive cough, wheezing and hypoxia.     COPD with acute exacerbation Patient has  a known history of COPD with chronic respiratory failure and at baseline is usually on 2.5 L of oxygen continuous.. She is currently on 3 L of oxygen maintaining pulse oximetry greater than 92% and has a nonproductive cough associated with wheezing as well as worsening shortness of breath Will place patient on Solu-Medrol 40 mg IV every 12 Place patient on scheduled and as needed bronchodilator therapy Continue inhaled steroids Place patient on doxycycline 100 mg twice daily     Diabetes Mellitus Maintain carb consistent diet Continue long-acting insulin Glycemic control with sliding scale insulin        HTN Continue Lisisnopri and carvedilol     PAD Stable  Continue  Plavix and Lovaza     Depression and anxiety Stable Continue Lexapro and alprazolam     Lung CA Patient is status post radiation therapy which she completed 10/21 for adenocarcinoma of the lung. Follow-up with oncology as an outpatient    DVT prophylaxis: Lovenox  Code Status: full code  Family Communication: Greater than 50% of time was spent discussing patient's condition and plan of care with her at the bedside.  All questions and concerns have been addressed.  She verbalizes understanding and agrees with the plan.  CODE STATUS was discussed and she is a full code.  She lists her daughters as her healthcare power of attorney. Disposition Plan: Back to previous home environment Consults called: none  Status:At the time of admission, it appears that the appropriate admission status for this patient is inpatient. This is judged to be reasonable and necessary to provide the required intensity of service to ensure the patient's safety given the presenting symptoms, physical exam findings, and initial radiographic and laboratory data in the context of their comorbid conditions. Patient requires inpatient status due to high intensity of service, high risk for further deterioration and high frequency of  surveillance required.     Collier Bullock MD [facility]     06/26/2021, 4:39 PM

## 2021-06-26 NOTE — ED Provider Notes (Signed)
[facility] Emergency Department Provider Note  ____________________________________________   Event Date/Time   First MD Initiated Contact with Patient 06/26/21 1128     (approximate)  I have reviewed the triage vital signs and the nursing notes.   HISTORY  Chief Complaint Respiratory Distress   HPI Angela Jensen is a 65 y.o. female with a history of diabetes, hypertension, COPD on home oxygen presents to the emergency department for treatment and evaluation of acute on chronic shortness of breath.  Patient states that she took her dog out for a walk this morning and when she came back in and went to the bathroom she got very short of breath.  She states that she said her oxygen to 3 L and called EMS because her saturations were staying in the 70s.  She denies chest pain, headache, leg pain. EMS gave Solumedrol 137m enroute.  Past Medical History:  Diagnosis Date   Anemia    Arthritis    Asthma    Atherosclerosis of native arteries of extremity with intermittent claudication (HDugger 05/26/2016   Cancer (HShirley 2012   Right Lung CA   COPD (chronic obstructive pulmonary disease) (HB[place]win [place]    Depression    Diabetes mellitus without complication (HFarber    Patient takes Janumet   Essential hypertension 05/26/2016   Hypercholesteremia    Oxygen dependent    2L at nite    PAD (peripheral artery disease) (HT[place] 06/22/2016   Peripheral vascular disease (HCherry [place]    Personal history of radiation therapy    Shortness of breath dyspnea    with exertion    Sialolithiasis    Sleep apnea    Wears dentures    full upper and lower    Patient Active Problem List   Diagnosis Date Noted   COPD with acute exacerbation (HUnity 06/11/2021   SOB (shortness of breath) 05/01/2021   HCAP (healthcare-associated pneumonia) 020/23/3435  Chronic systolic CHF (congestive heart failure) (HO[place]Kean 05/01/2021   Acute exacerbation of chronic obstructive pulmonary disease (COPD) (HMo[place]  04/30/2021   Elevated troponin 04/01/2021   Acute on chronic respiratory failure with hypoxia (HDe [place] 03/31/2021   Pneumothorax after biopsy 05/03/2020   Pneumothorax on right 05/01/2020   Chest pain on breathing 04/21/2020   Pleuritic chest pain 04/11/2020   Right lower lobe pulmonary nodule 04/02/2020   Iron deficiency anemia 04/02/2020   Pica in adults 04/02/2020   Goals of care, counseling/discussion 03/14/2020   Pain in left wrist 09/24/2019   Encounter for general adult medical examination with abnormal findings 06/28/2019   Elevated total protein 01/22/2019   Uncontrolled type 2 diabetes mellitus with hyperglycemia (HParsons 05/16/2018   Chronic obstructive pulmonary disease (HOmar 05/16/2018   Vitamin D deficiency 05/16/2018   Flu vaccine need 05/16/2018   Dysuria 05/16/2018   Cancer of lower lobe of right lung (HParker 01/21/2018   Screening for breast cancer 10/12/2017   Personal history of tobacco use, presenting hazards to health 02/03/2017   PAD (peripheral artery disease) (HLin[place] 06/22/2016   Essential hypertension 05/26/2016   Diabetes (HL[place] Lake 05/26/2016   Hyperlipidemia 05/26/2016   Atherosclerosis of native arteries of extremity with intermittent claudication (HScot[place] 05/26/2016   Uterine leiomyoma 04/30/2016   Elevated CEA 01/31/2016   Special screening for malignant neoplasms, colon    Benign neoplasm of sigmoid colon    Primary lung cancer (H[place]    Malignant neoplasm of upper lobe of right lung ([facility]     Past Surgical History:  Procedure Laterality  Date   CESAREAN SECTION     x3   COLONOSCOPY WITH PROPOFOL N/A 06/25/2015   Procedure: COLONOSCOPY WITH PROPOFOL;  Surgeon: Lucilla Lame, MD;  Location: [facility] ENDOSCOPY;  Service: Endoscopy;  Laterality: N/A;   COLONOSCOPY WITH PROPOFOL N/A 07/26/2020   Procedure: COLONOSCOPY WITH PROPOFOL;  Surgeon: Lucilla Lame, MD;  Location: [place];  Service: Endoscopy;  Laterality: N/A;   CYST REMOVAL LEG     and on shoulder     ESOPHAGOGASTRODUODENOSCOPY (EGD) WITH PROPOFOL N/A 07/26/2020   Procedure: ESOPHAGOGASTRODUODENOSCOPY (EGD) WITH PROPOFOL;  Surgeon: Lucilla Lame, MD;  Location: [place];  Service: Endoscopy;  Laterality: N/A;  Diabetic - oral meds   LOWER EXTREMITY ANGIOGRAPHY Left 09/29/2018   Procedure: LOWER EXTREMITY ANGIOGRAPHY;  Surgeon: Algernon Huxley, MD;  Location: [place] [facility];  Service: Cardiovascular;  Laterality: Left;   LUNG BIOPSY  05 15 2013   has lung "spots"   PERIPHERAL VASCULAR CATHETERIZATION Left 06/01/2016   Procedure: Lower Extremity Angiography;  Surgeon: Algernon Huxley, MD;  Location: [place] [facility];  Service: Cardiovascular;  Laterality: Left;   PERIPHERAL VASCULAR CATHETERIZATION N/A 06/01/2016   Procedure: Abdominal Aortogram w/Lower Extremity;  Surgeon: Algernon Huxley, MD;  Location: [place] [facility];  Service: Cardiovascular;  Laterality: N/A;   PERIPHERAL VASCULAR CATHETERIZATION  06/01/2016   Procedure: Lower Extremity Intervention;  Surgeon: Algernon Huxley, MD;  Location: [place] [facility];  Service: Cardiovascular;;   PERIPHERAL VASCULAR CATHETERIZATION Right 06/08/2016   Procedure: Lower Extremity Angiography;  Surgeon: Algernon Huxley, MD;  Location: [place] [facility];  Service: Cardiovascular;  Laterality: Right;   PERIPHERAL VASCULAR CATHETERIZATION  06/08/2016   Procedure: Lower Extremity Intervention;  Surgeon: Algernon Huxley, MD;  Location: [place] [facility];  Service: Cardiovascular;;   SUBMANDIBULAR GLAND EXCISION Left 12/06/2020   Procedure: EXCISION SUBMANDIBULAR GLAND;  Surgeon: Beverly Gust, MD;  Location: [place];  Service: ENT;  Laterality: Left;  needs to be first case Diabetic - diet controlled    Prior to Admission medications   Medication Sig Start Date End Date Taking? Authorizing Provider  Accu-Chek Softclix Lancets lancets Use as instructed for twice a daily Dx E11.65 04/11/21   [name], MD  albuterol  (VENTOLIN HFA) 108 (90 Base) MCG/ACT inhaler INHALE 2 PUFFS BY MOUTH EVERY 6 HOURS AS NEEDED FOR WHEEZING FOR SHORTNESS OF BREATH 06/26/21   [name], MD  ALPRAZolam Duanne Moron) 0.25 MG tablet Take 1 tablet (0.25 mg total) by mouth 3 (three) times daily as needed for anxiety or sleep. 06/17/21   Jonetta Osgood, NP  Blood Glucose Monitoring Suppl (ACCU-CHEK GUIDE) w/Device KIT Use as directed Dx e11.65 04/11/21   [name], MD  carvedilol (COREG) 3.125 MG tablet Take 1 tablet (3.125 mg total) by mouth 2 (two) times daily with a meal. 05/08/21   Nicole Kindred A, DO  clopidogrel (PLAVIX) 75 MG tablet Take 1 tablet by mouth once daily 06/22/21   Jonetta Osgood, NP  escitalopram (LEXAPRO) 10 MG tablet Take 1 tablet (10 mg total) by mouth daily. 05/08/21   Ezekiel Slocumb, DO  famotidine (PEPCID) 20 MG tablet Take 1 tablet (20 mg total) by mouth daily for 14 days. 06/14/21 06/28/21  Nolberto Hanlon, MD  ferrous sulfate 325 (65 FE) MG tablet Take 325 mg by mouth 2 (two) times daily with a meal.    [provider]  fluticasone (FLONASE) 50 MCG/ACT nasal spray Place  2 sprays into both nostrils daily. 05/09/21   Ezekiel Slocumb, DO  gabapentin (NEURONTIN) 100 MG capsule Take 1 capsule (100 mg total) by mouth 2 (two) times daily. 04/08/21   Jonetta Osgood, NP  glucose blood (ACCU-CHEK GUIDE) test strip USE  STRIP TO CHECK GLUCOSE TWICE DAILY 06/08/21   [name], MD  insulin detemir (LEVEMIR FLEXTOUCH) 100 UNIT/ML FlexPen Inject 8- 12 units with supper qd// with needles 04/22/21   [name], MD  lisinopril (ZESTRIL) 10 MG tablet Take 1 tablet (10 mg total) by mouth daily. 05/09/21   Ezekiel Slocumb, DO  metFORMIN (GLUCOPHAGE) 500 MG tablet Take 1 tablet (500 mg total) by mouth daily with breakfast. 03/12/21   [name], MD  mometasone-formoterol [facility]) 200-5 MCG/ACT AERO Inhale 2 puffs into the lungs 2 (two) times daily. 06/17/21   Jonetta Osgood, NP  Omega-3 Fatty Acids (FISH  OIL) 1000 MG CAPS Take 1 capsule by mouth daily.    [provider]  OXYGEN Inhale into the lungs.    [provider]  potassium chloride (KLOR-CON) 10 MEQ tablet TAKE 1 TABLET BY MOUTH EVERY OTHER DAY 04/29/21   [name], MD  tiotropium (SPIRIVA HANDIHALER) 18 MCG inhalation capsule INHALE THE CONTENTS OF ONE CAPSULE BY MOUTH ONCE DAILY. DO NOT SWALLOW CAPSULE. 06/17/21   Jonetta Osgood, NP    Allergies Trelegy ellipta [fluticasone-umeclidin-vilant]  Family History  Problem Relation Age of Onset   Lung cancer Father    Diabetes Mother    Hypercholesterolemia Mother    Diabetes Sister    Diabetes Maternal Grandmother    Diabetes Paternal Grandmother    Diabetes Sister    Heart attack Brother    Coronary artery disease Brother    Vascular Disease Brother    Hypertension Sister    Heart attack Brother     Social History Social History   Tobacco Use   Smoking status: Former    Packs/day: 1.00    Years: 37.00    Pack years: 37.00    Types: Cigarettes    Quit date: 02/06/2010    Years since quitting: 11.3   Smokeless tobacco: Former    Types: Snuff  Vaping Use   Vaping Use: Never used  Substance Use Topics   Alcohol use: Not Currently    Alcohol/week: 5.0 standard drinks    Types: 5 Cans of beer per week    Comment: /h x of alcohol abuse -stopped 2012- now drinks 5 beer per week   Drug use: Not Currently    Types: Marijuana, "Crack" cocaine, Cocaine    Comment: hx of cocaine use- last use 2015; last use marijuana6/22/19,    Review of Systems  Constitutional: No fever/chills. Eyes: No visual changes.  ENT: No sore throat. Cardiovascular: Negative for chest pain. Negative for pleuritic pain. Negative for palpitations. Negative for leg pain. Respiratory: Positive for shortness of breath. Gastrointestinal: Negative for abdominal pain.  no nausea,  no vomiting.  No diarrhea.  No constipation. Genitourinary: Negative for dysuria. Musculoskeletal:  Negative for back pain.  Skin: Negative for rash, lesion, wound. Neurological: Negative for headaches, focal weakness or numbness  ____________________________________________   PHYSICAL EXAM:  VITAL SIGNS: ED Triage Vitals  Enc Vitals Group     BP 06/26/21 1135 (!) 121/93     Pulse Rate 06/26/21 1135 99     Resp 06/26/21 1135 (!) 22     Temp 06/26/21 1135 97.6 F (36.4 C)  Temp Source 06/26/21 1135 Oral     SpO2 06/26/21 1129 96 %     Weight 06/26/21 1137 120 lb (54.4 kg)     Height 06/26/21 1137 _0  (1.499 m)     Head Circumference --      Peak Flow --      Pain Score 06/26/21 1137 0     Pain Loc --      Pain Edu? --      Excl. in [place]? --     Constitutional: Alert and oriented. Chronically ill appearing and in no acute distress. Normal mental status. Eyes: Conjunctivae are normal. PERRL. Head: Atraumatic. Nose: No congestion/rhinnorhea. Mouth/Throat: Mucous membranes are moist.  Oropharynx non-erythematous. Tongue normal in size and color. Neck: No stridor. No carotid bruit appreciated on exam. Hematological/Lymphatic/Immunilogical: No cervical lymphadenopathy. Cardiovascular: Normal rate, regular rhythm. Grossly normal heart sounds.  Good peripheral circulation. Respiratory: Normal respiratory effort.  No retractions. Diminished with expiratory wheezing. Gastrointestinal: Soft and nontender. No distention. No abdominal bruits. No CVA tenderness. Genitourinary: Exam deferred. Musculoskeletal: No lower extremity tenderness. no edema of extremities. Neurologic:  Normal speech and language. No gross focal neurologic deficits are appreciated. Skin:  Skin is warm, dry and intact. No rash noted. Psychiatric: Mood and affect are normal. Speech and behavior are normal.  ____________________________________________   LABS (all labs ordered are listed, but only abnormal results are displayed)  Labs Reviewed  COMPREHENSIVE METABOLIC PANEL - Abnormal; Notable for the  following components:      Result Value   Glucose, Bld 178 (*)    All other components within normal limits  CBC WITH DIFFERENTIAL/PLATELET - Abnormal; Notable for the following components:   RBC 3.69 (*)    Hemoglobin 10.8 (*)    HCT 35.3 (*)    All other components within normal limits  URINALYSIS, ROUTINE W REFLEX MICROSCOPIC - Abnormal; Notable for the following components:   Color, Urine YELLOW (*)    APPearance CLEAR (*)    Hgb urine dipstick SMALL (*)    Protein, ur 100 (*)    All other components within normal limits  BLOOD GAS, VENOUS - Abnormal; Notable for the following components:   pO2, Ven 59.0 (*)    Bicarbonate 28.7 (*)    Acid-Base Excess 2.3 (*)    All other components within normal limits  RESP PANEL BY RT-PCR (FLU A&B, COVID) ARPGX2  CULTURE, BLOOD (ROUTINE X 2)  CULTURE, BLOOD (ROUTINE X 2)   ____________________________________________  EKG  ED ECG REPORT I, Sofiya Ezelle, FNP-BC personally viewed and interpreted this ECG.   Date: 06/26/2021  EKG Time: 1133  Rate: 98  Rhythm: LBBB  Intervals:left bundle branch block  ST&T Change: no change from previous  ____________________________________________  RADIOLOGY  ED MD interpretation:    Pneumonia left greater than right. Improved from previous.  I, Sherrie George, personally viewed and evaluated these images (plain radiographs) as part of my medical decision making, as well as reviewing the written report by the radiologist.  Official radiology report(s): DG Chest 2 View  Result Date: 06/26/2021 CLINICAL DATA:  Shortness of breath EXAM: CHEST - 2 VIEW COMPARISON:  06/11/2021 FINDINGS: Background emphysema. Persistent but improved opacities at the right greater than left lung bases. No significant pleural effusion. No pneumothorax. Stable cardiomediastinal contours with normal heart size. IMPRESSION: Persistent but improved pneumonia at the right greater than left lung bases. Electronically Signed    By: Macy Mis M.D.   On: 06/26/2021 13:24  ____________________________________________   PROCEDURES  Procedure(s) performed: None  Procedures  Critical Care performed: No  ____________________________________________   INITIAL IMPRESSION / ASSESSMENT AND PLAN   65 year old female presenting to the emergency department for treatment and evaluation of shortness of breath.  Patient states that her oxygen saturation was in the 70s at home despite using 3 L of oxygen.  She was released on 06/14/2021 after being admitted for multifocal pneumonia.  Patient states that she did finish her Augmentin and prednisone as prescribed.    Differential diagnosis includes, but not limited to:  Differential includes, but is not limited to, viral syndrome, bronchitis including COPD exacerbation, reactive airway disease including asthma, CHF including exacerbation with or without pulmonary/interstitial edema, pneumothorax, ACS, thoracic trauma, and pulmonary embolism, ACS, aortic dissection, pulmonary embolism, cardiac tamponade, pneumothorax, pneumonia, pericarditis, myocarditis, GI-related causes including esophagitis/gastritis, and musculoskeletal chest wall pain.    ED COURSE  Chest x-ray shows pneumonia bilateral lobes, worse on the left with improvement from previous. Oxygen sat decreases to 89% on 2 liters. Patient dyspneic on exertion. Will admit for COPD exacerbation and further treatment of pneumonia with antibiotics.      FINAL CLINICAL IMPRESSION(S) / ED DIAGNOSES  Final diagnoses:  COPD exacerbation (No Name)  Multifocal pneumonia    ED Discharge Orders     None       As part of my medical decision making, I reviewed the following data within the [place] EKG interpreted , Old EKG reviewed, A consult was requested and obtained from this/these consultant(s) Medicine, and Notes from prior ED visits    Angela Jensen was evaluated in Emergency Department  on 06/26/2021 for the symptoms described in the history of present illness. She was evaluated in the context of the global COVID-19 pandemic, which necessitated consideration that the patient might be at risk for infection with the SARS-CoV-2 virus that causes COVID-19. Institutional protocols and algorithms that pertain to the evaluation of patients at risk for COVID-19 are in a state of rapid change based on information released by regulatory bodies including the CDC and federal and state organizations. These policies and algorithms were followed during the patient's care in the ED.   Note:  This document was prepared using Dragon voice recognition software and may include unintentional dictation errors.    Victorino Dike, FNP 06/26/21 1551    Naaman Plummer, MD 06/27/21 [phone]

## 2021-06-26 NOTE — ED Notes (Signed)
Pt given sandwich tray at this time  

## 2021-06-26 NOTE — ED Notes (Signed)
Informed RN bed assigned 

## 2021-06-27 DIAGNOSIS — C3411 Malignant neoplasm of upper lobe, right bronchus or lung: Secondary | ICD-10-CM

## 2021-06-27 DIAGNOSIS — J441 Chronic obstructive pulmonary disease with (acute) exacerbation: Secondary | ICD-10-CM | POA: Diagnosis not present

## 2021-06-27 DIAGNOSIS — J9611 Chronic respiratory failure with hypoxia: Secondary | ICD-10-CM

## 2021-06-27 DIAGNOSIS — E119 Type 2 diabetes mellitus without complications: Secondary | ICD-10-CM | POA: Diagnosis not present

## 2021-06-27 DIAGNOSIS — I10 Essential (primary) hypertension: Secondary | ICD-10-CM

## 2021-06-27 DIAGNOSIS — Z794 Long term (current) use of insulin: Secondary | ICD-10-CM

## 2021-06-27 LAB — BASIC METABOLIC PANEL
Anion gap: 7 (ref 5–15)
BUN: 22 mg/dL (ref 8–23)
CO2: 28 mmol/L (ref 22–32)
Calcium: 8.9 mg/dL (ref 8.9–10.3)
Chloride: 100 mmol/L (ref 98–111)
Creatinine, Ser: 0.66 mg/dL (ref 0.44–1.00)
GFR, Estimated: >60 mL/min (ref >60)
Glucose, Bld: 329 mg/dL — ABNORMAL HIGH (ref 70–99)
Potassium: 4.1 mmol/L (ref 3.5–5.1)
Sodium: 135 mmol/L (ref 135–145)

## 2021-06-27 LAB — CBC
HCT: 33.5 % — ABNORMAL LOW (ref 36.0–46.0)
Hemoglobin: 10.3 g/dL — ABNORMAL LOW (ref 12.0–15.0)
MCH: 28.5 pg (ref 26.0–34.0)
MCHC: 30.7 g/dL (ref 30.0–36.0)
MCV: 92.8 fL (ref 80.0–100.0)
Platelets: 233 10*3/uL (ref 150–400)
RBC: 3.61 MIL/uL — ABNORMAL LOW (ref 3.87–5.11)
RDW: 13.2 % (ref 11.5–15.5)
WBC: 11.6 10*3/uL — ABNORMAL HIGH (ref 4.0–10.5)
nRBC: 0 % (ref 0.0–0.2)

## 2021-06-27 LAB — GLUCOSE, CAPILLARY
Glucose-Capillary: 384 mg/dL — ABNORMAL HIGH (ref 70–99)
Glucose-Capillary: 278 mg/dL — ABNORMAL HIGH (ref 70–99)
Glucose-Capillary: 229 mg/dL — ABNORMAL HIGH (ref 70–99)
Glucose-Capillary: 201 mg/dL — ABNORMAL HIGH (ref 70–99)
Glucose-Capillary: 344 mg/dL — ABNORMAL HIGH (ref 70–99)

## 2021-06-27 MED ORDER — METHYLPREDNISOLONE SODIUM SUCC 125 MG IJ SOLR
80.0000 mg | INTRAMUSCULAR | Status: DC
  Administered 2021-06-27 – 2021-06-28 (×2): 80 mg via INTRAVENOUS
  Filled 2021-06-27 (×2): qty 2

## 2021-06-27 MED ORDER — ORAL CARE MOUTH RINSE
15.0000 mL | Freq: Two times a day (BID) | OROMUCOSAL | Status: DC
  Administered 2021-06-27 – 2021-06-29 (×5): 15 mL via OROMUCOSAL

## 2021-06-27 MED ORDER — IPRATROPIUM-ALBUTEROL 0.5-2.5 (3) MG/3ML IN SOLN
3.0000 mL | Freq: Four times a day (QID) | RESPIRATORY_TRACT | Status: DC
  Administered 2021-06-27 – 2021-06-29 (×7): 3 mL via RESPIRATORY_TRACT
  Filled 2021-06-27 (×6): qty 3

## 2021-06-27 MED ORDER — INSULIN ASPART 100 UNIT/ML IJ SOLN
0.0000 [IU] | Freq: Three times a day (TID) | INTRAMUSCULAR | Status: DC
  Administered 2021-06-27: 11 [IU] via SUBCUTANEOUS
  Administered 2021-06-28: 5 [IU] via SUBCUTANEOUS
  Administered 2021-06-28: 3 [IU] via SUBCUTANEOUS
  Administered 2021-06-28: 8 [IU] via SUBCUTANEOUS
  Administered 2021-06-28: 2 [IU] via SUBCUTANEOUS
  Administered 2021-06-29: 8 [IU] via SUBCUTANEOUS
  Filled 2021-06-27 (×6): qty 1

## 2021-06-27 NOTE — Progress Notes (Signed)
Inpatient Diabetes Program Recommendations  AACE/ADA: New Consensus Statement on Inpatient Glycemic Control (2015)  Target Ranges:  Prepandial:   less than 140 mg/dL      Peak postprandial:   less than 180 mg/dL (1-2 hours)      Critically ill patients:  140 - 180 mg/dL   Lab Results  Component Value Date   GLUCAP 278 (H) 06/27/2021   HGBA1C 6.6 (H) 06/11/2021    Review of Glycemic Control Results for Angela Jensen, Angela Jensen (MRN [id number]) as of 06/27/2021 08:18  Ref. Range 06/26/2021 17:50 06/26/2021 20:45 06/27/2021 07:30  Glucose-Capillary Latest Ref Range: 70 - 99 mg/dL 343 (H) 229 (H) 278 (H)   Diabetes history: DM 2 Outpatient Diabetes medications:  Levemir 8-12 units daily with supper, Metformin 500 mg daily Current orders for Inpatient glycemic control:  Prednisone 40 mg daily, Novolog moderate tid with meals, Levemir 12 units q HS  Inpatient Diabetes Program Recommendations:   A1C indicates good control of DM prior to admit however blood sugars increased with steroids. Consider increasing Levemir to 15 units q HS and add Novolog meal coverage 3 units tid with meals (hold if patient eats less than 50% or NPO).   Thanks,  Adah Perl, RN, BC-ADM Inpatient Diabetes Coordinator Pager [phone]  (8a-5p)

## 2021-06-27 NOTE — Progress Notes (Addendum)
SATURATION QUALIFICATIONS: (This note is used to comply with regulatory documentation for home oxygen)  Patient Saturations on Room Air at Rest = 85%  Patient Saturations on Room Air while Ambulating = 77%  Patient Saturations on 3 Liters of oxygen while Ambulating = 95%  Please briefly explain why patient needs home oxygen: patient experiencing SOB and dyspnea while ambulating without Oxygen.

## 2021-06-27 NOTE — Progress Notes (Signed)
[name], female   DOB: [birth year], 65 y.o.   MRN: [id number] [facility] PROGRESS NOTE  MALEEKA SABATINO CBJ:[id number] DOB: 12-11-1955 DOA: 06/26/2021 PCP: [name], MD  HPI/Subjective: Patient coming in with shortness of breath.  She chronically wears 2-1/2 L sometimes up to 3 L.  She has noticed her pulse ox go down into the 70s.  Yesterday was sweating.  She does have shortness of breath with walking.  When she feels well she is able to walk without restriction.  Admitted with COPD exacerbation.  Objective: Vitals:   06/27/21 0750 06/27/21 1135  BP: (!) 143/92 100/68  Pulse: 84 90  Resp: 16 20  Temp: 98 F (36.7 C) 98.4 F (36.9 C)  SpO2: 97% 98%    Intake/Output Summary (Last 24 hours) at 06/27/2021 1446 Last data filed at 06/27/2021 1326 Gross per 24 hour  Intake 720 ml  Output 800 ml  Net -80 ml   Filed Weights   06/26/21 1137  Weight: 54.4 kg    ROS: Review of Systems  Respiratory:  Positive for cough and shortness of breath.   Cardiovascular:  Negative for chest pain.  Gastrointestinal:  Negative for abdominal pain, nausea and vomiting.  Exam: Physical Exam HENT:     Head: Normocephalic.     Mouth/Throat:     Pharynx: No oropharyngeal exudate.  Eyes:     General: Lids are normal.     Conjunctiva/sclera: Conjunctivae normal.  Cardiovascular:     Rate and Rhythm: Normal rate and regular rhythm.     Heart sounds: Normal heart sounds, S1 normal and S2 normal.  Pulmonary:     Breath sounds: Examination of the right-lower field reveals decreased breath sounds. Examination of the left-lower field reveals decreased breath sounds. Decreased breath sounds present. No wheezing, rhonchi or rales.  Abdominal:     Palpations: Abdomen is soft.     Tenderness: There is no abdominal tenderness.  Musculoskeletal:     Right upper arm: No swelling.     Left upper arm: No swelling.  Skin:    General: Skin is warm.     Findings: No rash.   Neurological:     Mental Status: She is alert and oriented to person, place, and time.      Scheduled Meds:  carvedilol  3.125 mg Oral BID WC   clopidogrel  75 mg Oral Daily   doxycycline  100 mg Oral Q12H   enoxaparin (LOVENOX) injection  40 mg Subcutaneous Q24H   escitalopram  10 mg Oral Daily   famotidine  20 mg Oral Daily   ferrous sulfate  325 mg Oral BID WC   fluticasone  2 spray Each Nare Daily   gabapentin  100 mg Oral BID   insulin aspart  0-15 Units Subcutaneous TID WC   insulin detemir  12 Units Subcutaneous Q2200   ipratropium-albuterol  3 mL Nebulization Q6H   lisinopril  10 mg Oral Daily   mouth rinse  15 mL Mouth Rinse BID   mometasone-formoterol  2 puff Inhalation BID   omega-3 acid ethyl esters  1 g Oral Daily   predniSONE  40 mg Oral Q breakfast    Assessment/Plan:  COPD exacerbation.  We will change prednisone to Solu-Medrol 40 mg IV twice daily.  Empiric doxycycline.  Continue nebulizers and inhalers. Chronic hypoxic respiratory failure wears anywhere from 2.5 to 3 L of oxygen.  With ambulation today with nursing staff able to hold her saturations  on 3 L. Essential hypertension on lisinopril and Coreg Depression anxiety on Lexapro and alprazolam Adenocarcinoma of the lung, right.  Completed treatment.  CT scan showing fibrotic changes right lung Type 2 diabetes mellitus on detemir insulin and sliding scale.     Code Status:     Code Status Orders  (From admission, onward)           Start     Ordered   06/26/21 1632  Full code  Continuous        06/26/21 1636           Code Status History     Date Active Date Inactive Code Status Order ID Comments User Context   06/11/2021 1538 06/14/2021 2258 Full Code [id number]  [place], [place], DO ED   04/30/2021 2308 05/08/2021 1907 Full Code [id number]  Rhetta Mura, DO ED   03/31/2021 2351 04/05/2021 1917 Full Code [id number]  Orene Desanctis, DO Inpatient   05/01/2020 1502 05/05/2020 1805 Full Code  [id number]  Collier Bullock, MD ED   09/29/2018 1004 09/29/2018 1516 Full Code [id number]  Algernon Huxley, MD Inpatient   06/08/2016 0908 06/08/2016 1440 Full Code [id number]  Algernon Huxley, MD Inpatient   06/01/2016 1109 06/01/2016 1608 Full Code [id number]  Algernon Huxley, MD Inpatient   05/03/2015 1037 05/03/2015 1613 Full Code [id number]  Aletta Edouard, MD Inpatient      Family Communication: Left message for husband on the phone Disposition Plan: Status is: Inpatient.  Reevaluate tomorrow for disposition  Antibiotics: Doxycycline  Time spent: 28 minutes  [place]

## 2021-06-28 DIAGNOSIS — J441 Chronic obstructive pulmonary disease with (acute) exacerbation: Secondary | ICD-10-CM | POA: Diagnosis not present

## 2021-06-28 DIAGNOSIS — J9611 Chronic respiratory failure with hypoxia: Secondary | ICD-10-CM | POA: Diagnosis not present

## 2021-06-28 DIAGNOSIS — C3411 Malignant neoplasm of upper lobe, right bronchus or lung: Secondary | ICD-10-CM | POA: Diagnosis not present

## 2021-06-28 DIAGNOSIS — I10 Essential (primary) hypertension: Secondary | ICD-10-CM | POA: Diagnosis not present

## 2021-06-28 LAB — CBC
HCT: 32.1 % — ABNORMAL LOW (ref 36.0–46.0)
Hemoglobin: 9.8 g/dL — ABNORMAL LOW (ref 12.0–15.0)
MCH: 29.1 pg (ref 26.0–34.0)
MCHC: 30.5 g/dL (ref 30.0–36.0)
MCV: 95.3 fL (ref 80.0–100.0)
Platelets: 215 10*3/uL (ref 150–400)
RBC: 3.37 MIL/uL — ABNORMAL LOW (ref 3.87–5.11)
RDW: 13.7 % (ref 11.5–15.5)
WBC: 10.1 10*3/uL (ref 4.0–10.5)
nRBC: 0 % (ref 0.0–0.2)

## 2021-06-28 LAB — GLUCOSE, CAPILLARY
Glucose-Capillary: 252 mg/dL — ABNORMAL HIGH (ref 70–99)
Glucose-Capillary: 165 mg/dL — ABNORMAL HIGH (ref 70–99)
Glucose-Capillary: 248 mg/dL — ABNORMAL HIGH (ref 70–99)
Glucose-Capillary: 140 mg/dL — ABNORMAL HIGH (ref 70–99)

## 2021-06-28 LAB — PROCALCITONIN: Procalcitonin: <0.1 ng/mL

## 2021-06-28 MED ORDER — INSULIN ASPART 100 UNIT/ML IJ SOLN
3.0000 [IU] | Freq: Three times a day (TID) | INTRAMUSCULAR | Status: DC
  Administered 2021-06-28 – 2021-06-29 (×2): 3 [IU] via SUBCUTANEOUS
  Filled 2021-06-28 (×2): qty 1

## 2021-06-28 NOTE — Plan of Care (Signed)

## 2021-06-28 NOTE — Progress Notes (Signed)
Taken by Glade Lloyd

## 2021-06-28 NOTE — Progress Notes (Signed)
[name], female   DOB: 01/06/1956, 65 y.o.   MRN: [id number] [facility] PROGRESS NOTE  Angela Jensen MLY:[id number] DOB: 1956/01/11 DOA: 06/26/2021 PCP: [name], MD  HPI/Subjective: Patient with a lot of rattling in the right chest today.  She feels herself wheezing.  Still has some shortness of breath with activity.  Admitted with COPD exacerbation.  Objective: Vitals:   06/28/21 0738 06/28/21 1129  BP: 135/71 (!) 142/79  Pulse: 76 95  Resp: 16 17  Temp: 98.2 F (36.8 C) 98.2 F (36.8 C)  SpO2: 100% 99%    Intake/Output Summary (Last 24 hours) at 06/28/2021 1443 Last data filed at 06/28/2021 0901 Gross per 24 hour  Intake 480 ml  Output 800 ml  Net -320 ml   Filed Weights   06/26/21 1137  Weight: 54.4 kg    ROS: Review of Systems  Respiratory:  Positive for cough, shortness of breath and wheezing.   Cardiovascular:  Negative for chest pain.  Gastrointestinal:  Negative for abdominal pain, nausea and vomiting.  Exam: Physical Exam HENT:     Head: Normocephalic.     Mouth/Throat:     Pharynx: No oropharyngeal exudate.  Eyes:     General: Lids are normal.     Conjunctiva/sclera: Conjunctivae normal.  Cardiovascular:     Rate and Rhythm: Normal rate and regular rhythm.     Heart sounds: Normal heart sounds, S1 normal and S2 normal.  Pulmonary:     Breath sounds: Examination of the right-middle field reveals wheezing. Examination of the right-lower field reveals decreased breath sounds and wheezing. Decreased breath sounds and wheezing present. No rhonchi or rales.  Abdominal:     Palpations: Abdomen is soft.     Tenderness: There is no abdominal tenderness.  Musculoskeletal:     Right lower leg: No swelling.     Left lower leg: No swelling.  Skin:    General: Skin is warm.     Findings: No rash.  Neurological:     Mental Status: She is alert and oriented to person, place, and time.      Scheduled Meds:  carvedilol  3.125 mg  Oral BID WC   clopidogrel  75 mg Oral Daily   doxycycline  100 mg Oral Q12H   enoxaparin (LOVENOX) injection  40 mg Subcutaneous Q24H   escitalopram  10 mg Oral Daily   famotidine  20 mg Oral Daily   ferrous sulfate  325 mg Oral BID WC   fluticasone  2 spray Each Nare Daily   gabapentin  100 mg Oral BID   insulin aspart  0-15 Units Subcutaneous TID AC & HS   insulin detemir  12 Units Subcutaneous Q2200   ipratropium-albuterol  3 mL Nebulization Q6H   lisinopril  10 mg Oral Daily   mouth rinse  15 mL Mouth Rinse BID   methylPREDNISolone (SOLU-MEDROL) injection  80 mg Intravenous Q24H   mometasone-formoterol  2 puff Inhalation BID   omega-3 acid ethyl esters  1 g Oral Daily     Assessment/Plan:  COPD exacerbation.  Today with quite a bit more wheezing right lung than yesterday.  Continue Solu-Medrol 80 mg daily.  Empiric doxycycline.  Continue nebulizers and inhalers.  Reevaluate tomorrow. Chronic hypoxic respiratory failure.  Continue her chronic oxygen 2.5 to 3 L. 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  Completed treatment in the past.  CT  scan consistent with fibrotic changes right lung.  Could be radiation pneumonitis. Type 2 diabetes mellitus on detemir insulin and sliding scale.  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  Patient euvolemic      Code Status:     Code Status Orders  (From admission, onward)           Start     Ordered   06/26/21 1632  Full code  Continuous        06/26/21 1636           Code Status History     Date Active Date Inactive Code Status Order ID Comments User Context   06/11/2021 1538 06/14/2021 2258 Full Code [id number]  [place], Russell, DO ED   04/30/2021 2308 05/08/2021 1907 Full Code [id number]  Rhetta Mura, DO ED   03/31/2021 2351 04/05/2021 1917 Full Code  [id number]  Orene Desanctis, DO Inpatient   05/01/2020 1502 05/05/2020 1805 Full Code [id number]  Collier Bullock, MD ED   09/29/2018 1004 09/29/2018 1516 Full Code [id number]  Algernon Huxley, MD Inpatient   06/08/2016 0908 06/08/2016 1440 Full Code [id number]  Algernon Huxley, MD Inpatient   06/01/2016 1109 06/01/2016 1608 Full Code [id number]  Algernon Huxley, MD Inpatient   05/03/2015 1037 05/03/2015 1613 Full Code [id number]  Aletta Edouard, MD Inpatient      Family Communication: Spoke with daughter on the phone this morning Disposition Plan: Status is: Inpatient.  Patient with quite a bit more wheezing of the right lung today.  Reevaluate tomorrow  Antibiotics: Doxycycline  Time spent: 28 minutes, seen this morning and reevaluation this afternoon  [place]

## 2021-06-28 NOTE — Plan of Care (Signed)
  Problem: Education: Goal: Knowledge of General Education information will improve Description: Including pain rating scale, medication(s)/side effects and non-pharmacologic comfort measures 06/28/2021 1847 by Kerney Elbe, LPN Outcome: Progressing 06/28/2021 1845 by Kerney Elbe, LPN Outcome: Progressing 06/28/2021 1843 by Kerney Elbe, LPN Outcome: Progressing   Problem: Health Behavior/Discharge Planning: Goal: Ability to manage health-related needs will improve 06/28/2021 1847 by Kerney Elbe, LPN Outcome: Progressing 06/28/2021 1845 by Kerney Elbe, LPN Outcome: Progressing 06/28/2021 1843 by Kerney Elbe, LPN Outcome: Progressing   Problem: Clinical Measurements: Goal: Ability to maintain clinical measurements within normal limits will improve 06/28/2021 1847 by Kerney Elbe, LPN Outcome: Progressing 06/28/2021 1845 by Kerney Elbe, LPN Outcome: Progressing 06/28/2021 1843 by Kerney Elbe, LPN Outcome: Progressing Goal: Will remain free from infection 06/28/2021 1847 by Kerney Elbe, LPN Outcome: Progressing 06/28/2021 1845 by Kerney Elbe, LPN Outcome: Progressing 06/28/2021 1843 by Kerney Elbe, LPN Outcome: Progressing Goal: Diagnostic test results will improve 06/28/2021 1847 by Kerney Elbe, LPN Outcome: Progressing 06/28/2021 1845 by Kerney Elbe, LPN Outcome: Progressing 06/28/2021 1843 by Kerney Elbe, LPN Outcome: Progressing Goal: Respiratory complications will improve 06/28/2021 1847 by Kerney Elbe, LPN Outcome: Progressing 06/28/2021 1845 by Kerney Elbe, LPN Outcome: Progressing 06/28/2021 1843 by Kerney Elbe, LPN Outcome: Progressing Goal: Cardiovascular complication will be avoided 06/28/2021 1847 by Kerney Elbe, LPN Outcome: Progressing 06/28/2021 1845 by Kerney Elbe, LPN Outcome: Progressing 06/28/2021 1843 by Kerney Elbe, LPN Outcome: Progressing   Problem: Activity: Goal: Risk for activity intolerance will decrease 06/28/2021 1847 by Kerney Elbe, LPN Outcome: Progressing 06/28/2021 1843 by Kerney Elbe, LPN Outcome: Progressing   Problem: Nutrition: Goal: Adequate nutrition will be maintained Outcome: Progressing   Problem: Coping: Goal: Level of anxiety will decrease Outcome: Progressing   Problem: Elimination: Goal: Will not experience complications related to bowel motility Outcome: Progressing Goal: Will not experience complications related to urinary retention Outcome: Progressing   Problem: Pain Managment: Goal: General experience of comfort will improve Outcome: Progressing   Problem: Safety: Goal: Ability to remain free from injury will improve Outcome: Progressing   Problem: Skin Integrity: Goal: Risk for impaired skin integrity will decrease Outcome: Progressing   Problem: Education: Goal: Knowledge of disease or condition will improve Outcome: Progressing Goal: Knowledge of the prescribed therapeutic regimen will improve Outcome: Progressing   Problem: Activity: Goal: Ability to tolerate increased activity will improve Outcome: Progressing Goal: Will verbalize the importance of balancing activity with adequate rest periods Outcome: Progressing   Problem: Respiratory: Goal: Ability to maintain a clear airway will improve Outcome: Progressing Goal: Levels of oxygenation will improve Outcome: Progressing Goal: Ability to maintain adequate ventilation will improve Outcome: Progressing

## 2021-06-29 ENCOUNTER — Other Ambulatory Visit: Payer: Self-pay | Admitting: Internal Medicine

## 2021-06-29 DIAGNOSIS — I10 Essential (primary) hypertension: Secondary | ICD-10-CM | POA: Diagnosis not present

## 2021-06-29 DIAGNOSIS — J441 Chronic obstructive pulmonary disease with (acute) exacerbation: Secondary | ICD-10-CM | POA: Diagnosis not present

## 2021-06-29 DIAGNOSIS — I5042 Chronic combined systolic (congestive) and diastolic (congestive) heart failure: Secondary | ICD-10-CM

## 2021-06-29 DIAGNOSIS — J9611 Chronic respiratory failure with hypoxia: Secondary | ICD-10-CM | POA: Diagnosis not present

## 2021-06-29 DIAGNOSIS — C3411 Malignant neoplasm of upper lobe, right bronchus or lung: Secondary | ICD-10-CM | POA: Diagnosis not present

## 2021-06-29 LAB — GLUCOSE, CAPILLARY: Glucose-Capillary: 286 mg/dL — ABNORMAL HIGH (ref 70–99)

## 2021-06-29 MED ORDER — PREDNISONE 20 MG PO TABS: ORAL_TABLET | ORAL | 0 refills | Status: DC

## 2021-06-29 MED ORDER — DOXYCYCLINE HYCLATE 100 MG PO TABS: 100.0000 mg | ORAL_TABLET | Freq: Two times a day (BID) | ORAL | 0 refills | Status: AC

## 2021-06-29 NOTE — Discharge Summary (Signed)
[facility] [place] [facility] [place] NAME: Angela Jensen    MR#:  [id number]  DATE OF BIRTH:  May 19, 1956  DATE OF ADMISSION:  06/26/2021 ADMITTING PHYSICIAN: Collier Bullock, MD  DATE OF DISCHARGE: 06/29/2021 11:15 AM  PRIMARY CARE PHYSICIAN: [name], MD    ADMISSION DIAGNOSIS:  COPD exacerbation ([place]) [J44.1] CAP (community acquired pneumonia) [J18.9] Multifocal pneumonia [J18.9]  DISCHARGE DIAGNOSIS:  Principal Problem:   Acute exacerbation of chronic obstructive pulmonary disease (COPD) ([place]) Active Problems:   Malignant neoplasm of upper lobe of right lung ([place])   Essential hypertension   Diabetes ([place])   Chronic systolic CHF (congestive heart failure) ([place])   COPD exacerbation ([facility])   CAP (community acquired pneumonia)   Chronic respiratory failure with hypoxia ([place])   SECONDARY DIAGNOSIS:   Past Medical History:  Diagnosis Date   Anemia    Arthritis    Asthma    Atherosclerosis of native arteries of extremity with intermittent claudication ([place]) 05/26/2016   Cancer ([place]) 2012   Right Lung CA   COPD (chronic obstructive pulmonary disease) ([place])    Depression    Diabetes mellitus without complication ([place])    Patient takes Janumet   Essential hypertension 05/26/2016   Hypercholesteremia    Oxygen dependent    2L at nite    PAD (peripheral artery disease) ([place]) 06/22/2016   Peripheral vascular disease ([facility])    Personal history of radiation therapy    Shortness of breath dyspnea    with exertion    Sialolithiasis    Sleep apnea    Wears dentures    full upper and lower    HOSPITAL COURSE:   1.  COPD exacerbation.  Lungs clear today.  Stable for disposition home.  We will change over to prednisone for 3 more days and continue doxycycline upon discharge home.  Patient has inhalers at home.  Follow-up with Dr. Devona Konig as outpatient.  Patient reminded to take slow deep breaths in order to oxygenate better if she is short of  breath. 2.  Chronic hypoxic respiratory failure continue her chronic oxygen 2.5 to 3 L. 3.  Essential hypertension on lisinopril and Coreg 4.  Depression anxiety.  Continue Lexapro and alprazolam 5.  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  Completed treatment in the past.  CT scan consistent with fibrotic changes in the right lung.  Wheezing could be a radiation pneumonitis. 6.  Type 2 diabetes mellitus on detemir insulin and sliding scale.  Sugars likely be a little higher being steroids for a few days. 7.  Chronic combined diastolic and systolic congestive heart failure.  No signs of heart failure on this hospital stay.  Patient on Coreg and lisinopril.  Patient euvolemic  DISCHARGE CONDITIONS:   Satisfactory  CONSULTS OBTAINED:    None  DRUG ALLERGIES:   Allergies  Allergen Reactions   Trelegy Ellipta [Fluticasone-Umeclidin-Vilant]     DISCHARGE MEDICATIONS:   Allergies as of 06/29/2021       Reactions   Trelegy Ellipta [fluticasone-umeclidin-vilant]         Medication List     TAKE these medications    Accu-Chek Guide test strip Generic drug: glucose blood USE  STRIP TO CHECK GLUCOSE TWICE DAILY   Accu-Chek Guide w/Device Kit Use as directed Dx e11.65   Accu-Chek Softclix Lancets lancets Use as instructed for twice a daily Dx E11.65   albuterol 108 (90 Base) MCG/ACT inhaler Commonly known as: VENTOLIN HFA INHALE  2 PUFFS BY MOUTH EVERY 6 HOURS AS NEEDED FOR WHEEZING FOR SHORTNESS OF BREATH What changed: See the new instructions.   ALPRAZolam 0.25 MG tablet Commonly known as: XANAX Take 1 tablet (0.25 mg total) by mouth 3 (three) times daily as needed for anxiety or sleep.   carvedilol 3.125 MG tablet Commonly known as: COREG Take 1 tablet (3.125 mg total) by mouth 2 (two) times daily with a meal.   clopidogrel 75 MG tablet Commonly known as: PLAVIX Take 1 tablet by mouth once daily   doxycycline 100 MG tablet Commonly known as: VIBRA-TABS Take 1  tablet (100 mg total) by mouth every 12 (twelve) hours for 9 doses.   escitalopram 10 MG tablet Commonly known as: LEXAPRO Take 1 tablet (10 mg total) by mouth daily.   famotidine 20 MG tablet Commonly known as: PEPCID Take 1 tablet (20 mg total) by mouth daily for 14 days.   ferrous sulfate 325 (65 FE) MG tablet Take 325 mg by mouth 2 (two) times daily with a meal.   Fish Oil 1000 MG Caps Take 1 capsule by mouth daily.   fluticasone 50 MCG/ACT nasal spray Commonly known as: FLONASE Place 2 sprays into both nostrils daily.   gabapentin 100 MG capsule Commonly known as: NEURONTIN Take 1 capsule (100 mg total) by mouth 2 (two) times daily.   Levemir FlexTouch 100 UNIT/ML FlexPen Generic drug: insulin detemir Inject 8- 12 units with supper qd// with needles   lisinopril 10 MG tablet Commonly known as: ZESTRIL Take 1 tablet (10 mg total) by mouth daily.   metFORMIN 500 MG tablet Commonly known as: GLUCOPHAGE Take 1 tablet (500 mg total) by mouth daily with breakfast.   mometasone-formoterol 200-5 MCG/ACT Aero Commonly known as: DULERA Inhale 2 puffs into the lungs 2 (two) times daily.   OXYGEN Inhale into the lungs.   potassium chloride 10 MEQ tablet Commonly known as: KLOR-CON TAKE 1 TABLET BY MOUTH EVERY OTHER DAY   predniSONE 20 MG tablet Commonly known as: DELTASONE 2 tabs po daily for three more days   Spiriva HandiHaler 18 MCG inhalation capsule Generic drug: tiotropium INHALE THE CONTENTS OF ONE CAPSULE BY MOUTH ONCE DAILY. DO NOT SWALLOW CAPSULE.         DISCHARGE INSTRUCTIONS:   Follow-up PMD 5 days Follow-up pulmonary 1-2 weeks  If you experience worsening of your admission symptoms, develop shortness of breath, life threatening emergency, suicidal or homicidal thoughts you must seek medical attention immediately by calling 911 or calling your MD immediately  if symptoms less severe.  You Must read complete instructions/literature along with  all the possible adverse reactions/side effects for all the Medicines you take and that have been prescribed to you. Take any new Medicines after you have completely understood and accept all the possible adverse reactions/side effects.   Please note  You were cared for by a hospitalist during your hospital stay. If you have any questions about your discharge medications or the care you received while you were in the hospital after you are discharged, you can call the unit and asked to speak with the hospitalist on call if the hospitalist that took care of you is not available. Once you are discharged, your primary care physician will handle any further medical issues. Please note that NO REFILLS for any discharge medications will be authorized once you are discharged, as it is imperative that you return to your primary care physician (or establish a relationship with a primary care  physician if you do not have one) for your aftercare needs so that they can reassess your need for medications and monitor your lab values.    Today   CHIEF COMPLAINT:   Chief Complaint  Patient presents with   Respiratory Distress    HISTORY OF PRESENT ILLNESS:  Laya Letendre  is a 65 y.o. female came in with respiratory distress and COPD exacerbation   VITAL SIGNS:  Blood pressure (!) 152/91, pulse 87, temperature 98.2 F (36.8 C), resp. rate 14, height '4\' 11"'  (1.499 m), weight 54.4 kg, SpO2 97 %.    PHYSICAL EXAMINATION:  GENERAL:  65 y.o.-year-old patient lying in the bed with no acute distress.  EYES: Pupils equal, round, reactive to light and accommodation. No scleral icterus.  HEENT: Head atraumatic, normocephalic. Oropharynx and nasopharynx clear.  Tympanic membrane and ear canal no bleeding.  Slight irritation left ear canal. LUNGS: Normal breath sounds bilaterally, no wheezing, rales,rhonchi or crepitation. No use of accessory muscles of respiration.  CARDIOVASCULAR: S1, S2 normal. No murmurs, rubs,  or gallops.  ABDOMEN: Soft, non-tender. EXTREMITIES: No pedal edema.  NEUROLOGIC: Cranial nerves II through XII are intact. Muscle strength 5/5 in all extremities. Sensation intact. Gait not checked.  PSYCHIATRIC: The patient is alert and oriented x 3.  SKIN: No obvious rash, lesion, or ulcer.   DATA REVIEW:   CBC Recent Labs  Lab 06/28/21 0355  WBC 10.1  HGB 9.8*  HCT 32.1*  PLT 215    Chemistries  Recent Labs  Lab 06/26/21 1141 06/27/21 0553  NA 137 135  K 3.8 4.1  CL 102 100  CO2 27 28  GLUCOSE 178* 329*  BUN 23 22  CREATININE 0.62 0.66  CALCIUM 9.0 8.9  AST 40  --   ALT 38  --   ALKPHOS 70  --   BILITOT 0.6  --      Microbiology Results  Results for orders placed or performed during the hospital encounter of 06/26/21  Resp Panel by RT-PCR (Flu A&B, Covid) Nasopharyngeal Swab     Status: None   Collection Time: 06/26/21 11:41 AM   Specimen: Nasopharyngeal Swab; Nasopharyngeal(NP) swabs in vial transport medium  Result Value Ref Range Status   SARS Coronavirus 2 by RT PCR NEGATIVE NEGATIVE Final    Comment: (NOTE) SARS-CoV-2 target nucleic acids are NOT DETECTED.  The SARS-CoV-2 RNA is generally detectable in upper respiratory specimens during the acute phase of infection. The lowest concentration of SARS-CoV-2 viral copies this assay can detect is 138 copies/mL. A negative result does not preclude SARS-Cov-2 infection and should not be used as the sole basis for treatment or other patient management decisions. A negative result may occur with  improper specimen collection/handling, submission of specimen other than nasopharyngeal swab, presence of viral mutation(s) within the areas targeted by this assay, and inadequate number of viral copies(<138 copies/mL). A negative result must be combined with clinical observations, patient history, and epidemiological information. The expected result is Negative.  Fact Sheet for Patients:   EntrepreneurPulse.com.au  Fact Sheet for Healthcare Providers:  IncredibleEmployment.be  This test is no t yet approved or cleared by the [place] FDA and  has been authorized for detection and/or diagnosis of SARS-CoV-2 by FDA under an Emergency Use Authorization (EUA). This EUA will remain  in effect (meaning this test can be used) for the duration of the COVID-19 declaration under Section 564(b)(1) of the Act, 21 U.S.C.section 360bbb-3(b)(1), unless the authorization is terminated  or revoked  sooner.       Influenza A by PCR NEGATIVE NEGATIVE Final   Influenza B by PCR NEGATIVE NEGATIVE Final    Comment: (NOTE) The Xpert Xpress SARS-CoV-2/FLU/RSV plus assay is intended as an aid in the diagnosis of influenza from Nasopharyngeal swab specimens and should not be used as a sole basis for treatment. Nasal washings and aspirates are unacceptable for Xpert Xpress SARS-CoV-2/FLU/RSV testing.  Fact Sheet for Patients: EntrepreneurPulse.com.au  Fact Sheet for Healthcare Providers: IncredibleEmployment.be  This test is not yet approved or cleared by the [place] FDA and has been authorized for detection and/or diagnosis of SARS-CoV-2 by FDA under an Emergency Use Authorization (EUA). This EUA will remain in effect (meaning this test can be used) for the duration of the COVID-19 declaration under Section 564(b)(1) of the Act, 21 U.S.C. section 360bbb-3(b)(1), unless the authorization is terminated or revoked.  Performed at [facility], [address] [place], [place] [address]   Blood culture (routine x 2)     Status: None (Preliminary result)   Collection Time: 06/26/21  2:01 PM   Specimen: BLOOD  Result Value Ref Range Status   Specimen Description BLOOD RIGHT ANTECUBITAL  Final   Special Requests   Final    BOTTLES DRAWN AEROBIC AND ANAEROBIC Blood Culture adequate volume   Culture    Final    NO GROWTH 3 DAYS Performed at [facility], [address] [place], [place] [address]    Report Status PENDING  Incomplete  Blood culture (routine x 2)     Status: None (Preliminary result)   Collection Time: 06/26/21  2:01 PM   Specimen: BLOOD  Result Value Ref Range Status   Specimen Description BLOOD LEFT ANTECUBITAL  Final   Special Requests   Final    BOTTLES DRAWN AEROBIC AND ANAEROBIC Blood Culture adequate volume   Culture   Final    NO GROWTH 3 DAYS Performed at [facility], [address] [place], [place] [address]    Report Status PENDING  Incomplete      Management plans discussed with the patient, family and they are in agreement.  CODE STATUS:     Code Status Orders  (From admission, onward)           Start     Ordered   06/26/21 1632  Full code  Continuous        06/26/21 1636           Code Status History     Date Active Date Inactive Code Status Order ID Comments User Context   06/11/2021 1538 06/14/2021 2258 Full Code [id number]  [place], [place], DO ED   04/30/2021 2308 05/08/2021 1907 Full Code [id number]  Rhetta Mura, DO ED   03/31/2021 2351 04/05/2021 1917 Full Code [id number]  Orene Desanctis, DO Inpatient   05/01/2020 1502 05/05/2020 1805 Full Code [id number]  Collier Bullock, MD ED   09/29/2018 1004 09/29/2018 1516 Full Code [id number]  Algernon Huxley, MD Inpatient   06/08/2016 0908 06/08/2016 1440 Full Code [id number]  Algernon Huxley, MD Inpatient   06/01/2016 1109 06/01/2016 1608 Full Code [id number]  Algernon Huxley, MD Inpatient   05/03/2015 1037 05/03/2015 1613 Full Code [id number]  Aletta Edouard, MD Inpatient       TOTAL TIME TAKING CARE OF THIS PATIENT: 35 minutes.    Loletha Grayer M.D on 06/29/2021 at 2:38 PM    [facility]  CC: Primary care physician; Clayborn Bigness  Jerilynn Mages, MD

## 2021-06-30 ENCOUNTER — Other Ambulatory Visit: Payer: Self-pay

## 2021-06-30 ENCOUNTER — Telehealth: Payer: Self-pay

## 2021-06-30 MED ORDER — ALBUTEROL SULFATE (2.5 MG/3ML) 0.083% IN NEBU: INHALATION_SOLUTION | RESPIRATORY_TRACT | 1 refills | Status: DC

## 2021-06-30 NOTE — Telephone Encounter (Signed)
Yes please send it

## 2021-06-30 NOTE — Telephone Encounter (Signed)
Spoke to pt and let her know nebulizer solution was sent in

## 2021-07-01 ENCOUNTER — Encounter: Payer: Self-pay | Admitting: Oncology

## 2021-07-01 ENCOUNTER — Inpatient Hospital Stay: Payer: Medicare Other | Attending: Oncology | Admitting: Oncology

## 2021-07-01 ENCOUNTER — Other Ambulatory Visit: Payer: Self-pay

## 2021-07-01 VITALS — BP 145/85 | HR 101 | Temp 97.6°F | Wt 126.0 lb

## 2021-07-01 DIAGNOSIS — D509 Iron deficiency anemia, unspecified: Secondary | ICD-10-CM | POA: Diagnosis not present

## 2021-07-01 DIAGNOSIS — C3411 Malignant neoplasm of upper lobe, right bronchus or lung: Secondary | ICD-10-CM | POA: Diagnosis not present

## 2021-07-01 DIAGNOSIS — C3431 Malignant neoplasm of lower lobe, right bronchus or lung: Secondary | ICD-10-CM

## 2021-07-01 LAB — CULTURE, BLOOD (ROUTINE X 2)
Culture: NO GROWTH
Culture: NO GROWTH
Special Requests: ADEQUATE
Special Requests: ADEQUATE

## 2021-07-01 NOTE — Progress Notes (Signed)
Hematology/Oncology Progress note [phone] [phone]      Clinic Day:  07/01/2021  Referring physician: [name], MD  Chief Complaint: Angela Jensen is a 65 y.o. female presents follow-up for  history of stage I lung cancer s/p radiation and iron deficiency anemia    PERTINENT ONCOLOGY HISTORY Patient previously followed up by Dr.Corcoran, patient switched care to me on 03/20/21 Extensive medical record review was performed by me  # Stage I RUL adenocarcinoma  s/p IMRT in 02/2012.   She was not a good surgery candidate secondary to her lung function (FEV1 28%).  CT guided biopsy of the superior and inferior RUL nodules on 12/30/2011 revealed well differentiated adenocarcinoma of lung primary.  EGFR testing revealed no mutation.  She received IMRT 6000 cGy to both nodules from 01/26/2012 - 03/10/2012.   In 04/2015, there was an attempt at radiofrequency ablation of 1 enlarging RUL nodule.  At the time of the procedure, the nodule had shrunk to 5 mm from 9 mm.     # Stage I RLL adenocarcinoma 2019   CT guided RLL biopsy on 02/09/2018 revealed adenocarcinoma of lung origin, lepidic and papillary patterns.   She received 6000 cGy in 5 fractions to RLL nodule from 03/22/2018 - 04/05/2018.   Chest CT on 03/04/2020 revealed enlarging and new pulmonary nodules suspicious for metastatic disease or metachronous primary.  PET scan on 03/19/2020 revealed that the right lower lobe pulmonary nodules did not demonstrate any hypermetabolism to suggest neoplasm. However, these were somewhat worrisome; recommendation was close CT surveillance. There was no mediastinal or hilar lymphadenopathy.  Chest CT  on 09/11/2020 revealed interval treatment of the right lower lobe nodules. These nodules were partly obscured by adjacent parenchymal opacity, although appear decreased in size.  CT guided RLL biopsy on 04/24/2020 revealed adenocarcinoma of the lung with papillary and solid  patterns. 05/28/2020 - 06/11/2020  SBRT to the right lung. She received 60 Gy in 5 fractions.  She has an elevated CEA of unclear etiology.  CEA was 8.1 on 01/31/2016, 9.2 on 03/03/2016, 7.9 on 10/12/2017, and 7.9 on 01/19/2019.   Colonoscopy on 06/25/2015 revealed a 4 mm polyp in the sigmoid colon.  Pathology revealed a tubular adenoma without evidence of dysplasia or malignancy.   Pelvic MRI on 04/30/2016 revealed a 3.4 x 2.5 cm ovoid mass in the LEFT adnexa with signal characteristics identical to the intramural leiomyomas and consistent with an exophytic leiomyoma.  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  Endometrium was normal with a normal junctional zone.  Ovaries were not identified.  # iron deficiency anemia.  She required 2 units of PRBCs on 04/04/2020.  Colonoscopy on 06/25/2015 revealed one 4 mm polyp (tubular adenoma) in the sigmoid colon. There were non-bleeding internal hemorrhoids.  Colonoscopy on 07/26/2020 revealed diverticulosis in the entire examined colon. There were non-bleeding internal hemorrhoids. EGD on 07/26/2020 revealed erosive gastropathy with no bleeding and no stigmata of recent bleeding.    INTERVAL HISTORY Angela Jensen is a 65 y.o. female who has above history reviewed by me today presents for follow up visit for iron deficiency anemia, history of lung cancer Since patient's last visit, patient has had 3 hospitalization due to COPD exacerbation/pneumonia. Patient is on oxygen 3 L now.  Previously she was on oxygen as needed. Patient feels much better since her most recent discharge on 06/29/2021.  Past Medical History:  Diagnosis Date   Anemia    Arthritis    Asthma  Atherosclerosis of native arteries of extremity with intermittent claudication ([place]) 05/26/2016   Cancer ([place]) 2012   Right Lung CA   COPD (chronic obstructive pulmonary disease) ([place])    Depression    Diabetes mellitus without complication ([place])    Patient takes Janumet   Essential  hypertension 05/26/2016   Hypercholesteremia    Oxygen dependent    2L at nite    PAD (peripheral artery disease) ([place]) 06/22/2016   Peripheral vascular disease ([facility])    Personal history of radiation therapy    Shortness of breath dyspnea    with exertion    Sialolithiasis    Sleep apnea    Wears dentures    full upper and lower    Past Surgical History:  Procedure Laterality Date   CESAREAN SECTION     x3   COLONOSCOPY WITH PROPOFOL N/A 06/25/2015   Procedure: COLONOSCOPY WITH PROPOFOL;  Surgeon: Lucilla Lame, MD;  Location: [facility];  Service: Endoscopy;  Laterality: N/A;   COLONOSCOPY WITH PROPOFOL N/A 07/26/2020   Procedure: COLONOSCOPY WITH PROPOFOL;  Surgeon: Lucilla Lame, MD;  Location: [facility];  Service: Endoscopy;  Laterality: N/A;   CYST REMOVAL LEG     and on shoulder    ESOPHAGOGASTRODUODENOSCOPY (EGD) WITH PROPOFOL N/A 07/26/2020   Procedure: ESOPHAGOGASTRODUODENOSCOPY (EGD) WITH PROPOFOL;  Surgeon: Lucilla Lame, MD;  Location: [facility];  Service: Endoscopy;  Laterality: N/A;  Diabetic - oral meds   LOWER EXTREMITY ANGIOGRAPHY Left 09/29/2018   Procedure: LOWER EXTREMITY ANGIOGRAPHY;  Surgeon: Algernon Huxley, MD;  Location: [facility];  Service: Cardiovascular;  Laterality: Left;   LUNG BIOPSY  05 15 2013   has lung "spots"   PERIPHERAL VASCULAR CATHETERIZATION Left 06/01/2016   Procedure: Lower Extremity Angiography;  Surgeon: Algernon Huxley, MD;  Location: [facility];  Service: Cardiovascular;  Laterality: Left;   PERIPHERAL VASCULAR CATHETERIZATION N/A 06/01/2016   Procedure: Abdominal Aortogram w/Lower Extremity;  Surgeon: Algernon Huxley, MD;  Location: [facility];  Service: Cardiovascular;  Laterality: N/A;   PERIPHERAL VASCULAR CATHETERIZATION  06/01/2016   Procedure: Lower Extremity Intervention;  Surgeon: Algernon Huxley, MD;  Location: [facility];  Service: Cardiovascular;;   PERIPHERAL VASCULAR  CATHETERIZATION Right 06/08/2016   Procedure: Lower Extremity Angiography;  Surgeon: Algernon Huxley, MD;  Location: [facility];  Service: Cardiovascular;  Laterality: Right;   PERIPHERAL VASCULAR CATHETERIZATION  06/08/2016   Procedure: Lower Extremity Intervention;  Surgeon: Algernon Huxley, MD;  Location: [facility];  Service: Cardiovascular;;   SUBMANDIBULAR GLAND EXCISION Left 12/06/2020   Procedure: EXCISION SUBMANDIBULAR GLAND;  Surgeon: Beverly Gust, MD;  Location: [place];  Service: ENT;  Laterality: Left;  needs to be first case Diabetic - diet controlled    Family History  Problem Relation Age of Onset   Lung cancer Father    Diabetes Mother    Hypercholesterolemia Mother    Diabetes Sister    Diabetes Maternal Grandmother    Diabetes Paternal Grandmother    Diabetes Sister    Heart attack Brother    Coronary artery disease Brother    Vascular Disease Brother    Hypertension Sister    Heart attack Brother     Social History:  reports that she quit smoking about 11 years ago. Her smoking use included cigarettes. She has a 37.00 pack-year smoking history. She has quit using smokeless tobacco.  Her smokeless tobacco use included  snuff. She reports that she does not currently use alcohol after a past usage of about 5.0 standard drinks per week. She reports that she does not currently use drugs after having used the following drugs: Marijuana, "Crack" cocaine, and Cocaine. She started smoking at 17 until she was "10 something". Her boyfriend is [name]: [phone]. She lives in [place]. The patient is alone today.  Allergies:  Allergies  Allergen Reactions   Trelegy Ellipta [Fluticasone-Umeclidin-Vilant]     Current Medications: Current Outpatient Medications  Medication Sig Dispense Refill   Accu-Chek Softclix Lancets lancets Use as instructed for twice a daily Dx E11.65 100 each 1   albuterol (PROVENTIL) (2.5 MG/3ML) 0.083% nebulizer  solution USE 1 VIAL IN NEBULIZER EVERY 6 HOURS AS NEEDED FOR WHEEZING 1080 mL 1   albuterol (VENTOLIN HFA) 108 (90 Base) MCG/ACT inhaler INHALE 2 PUFFS BY MOUTH EVERY 6 HOURS AS NEEDED FOR WHEEZING FOR SHORTNESS OF BREATH 9 g 0   ALPRAZolam (XANAX) 0.25 MG tablet Take 1 tablet (0.25 mg total) by mouth 3 (three) times daily as needed for anxiety or sleep. 30 tablet 0   Blood Glucose Monitoring Suppl (ACCU-CHEK GUIDE) w/Device KIT Use as directed Dx e11.65 1 kit 0   carvedilol (COREG) 3.125 MG tablet Take 1 tablet (3.125 mg total) by mouth 2 (two) times daily with a meal. 60 tablet 1   clopidogrel (PLAVIX) 75 MG tablet Take 1 tablet by mouth once daily 90 tablet 0   doxycycline (VIBRA-TABS) 100 MG tablet Take 1 tablet (100 mg total) by mouth every 12 (twelve) hours for 9 doses. 9 tablet 0   escitalopram (LEXAPRO) 10 MG tablet Take 1 tablet (10 mg total) by mouth daily. 30 tablet 2   ferrous sulfate 325 (65 FE) MG tablet Take 325 mg by mouth 2 (two) times daily with a meal.     fluticasone (FLONASE) 50 MCG/ACT nasal spray Place 2 sprays into both nostrils daily. 9.9 mL 2   gabapentin (NEURONTIN) 100 MG capsule Take 1 capsule (100 mg total) by mouth 2 (two) times daily. 90 capsule 3   glucose blood (ACCU-CHEK GUIDE) test strip USE  STRIP TO CHECK GLUCOSE TWICE DAILY 100 each 3   insulin detemir (LEVEMIR FLEXTOUCH) 100 UNIT/ML FlexPen Inject 8- 12 units with supper qd// with needles 6 mL 11   lisinopril (ZESTRIL) 10 MG tablet Take 1 tablet (10 mg total) by mouth daily. 30 tablet 2   metFORMIN (GLUCOPHAGE) 500 MG tablet Take 1 tablet (500 mg total) by mouth daily with breakfast. 90 tablet 3   mometasone-formoterol (DULERA) 200-5 MCG/ACT AERO Inhale 2 puffs into the lungs 2 (two) times daily. 13 g 5   Omega-3 Fatty Acids (FISH OIL) 1000 MG CAPS Take 1 capsule by mouth daily.     OXYGEN Inhale into the lungs.     potassium chloride (KLOR-CON) 10 MEQ tablet TAKE 1 TABLET BY MOUTH EVERY OTHER DAY 45 tablet 3    predniSONE (DELTASONE) 20 MG tablet 2 tabs po daily for three more days 6 tablet 0   tiotropium (SPIRIVA HANDIHALER) 18 MCG inhalation capsule INHALE THE CONTENTS OF ONE CAPSULE BY MOUTH ONCE DAILY. DO NOT SWALLOW CAPSULE. 90 capsule 0   famotidine (PEPCID) 20 MG tablet Take 1 tablet (20 mg total) by mouth daily for 14 days. 14 tablet 0   No current facility-administered medications for this visit.    Review of Systems  Constitutional:  Negative for chills, diaphoresis, fever, malaise/fatigue and weight loss.  HENT:  Negative for congestion, ear discharge, ear pain, hearing loss, nosebleeds, sinus pain, sore throat and tinnitus.   Eyes: Negative.  Negative for blurred vision.  Respiratory:  Negative for cough, hemoptysis, sputum production and shortness of breath.        Uses oxygen at night.  Cardiovascular:  Negative for chest pain, palpitations and leg swelling.  Gastrointestinal: Negative.  Negative for abdominal pain, blood in stool, constipation, diarrhea, heartburn, melena, nausea and vomiting.  Genitourinary: Negative.  Negative for dysuria, frequency, hematuria and urgency.  Musculoskeletal:  Negative for back pain, joint pain, myalgias and neck pain.  Skin: Negative.  Negative for itching and rash.  Neurological: Negative.  Negative for dizziness, tingling, sensory change, speech change, focal weakness, weakness and headaches.  Endo/Heme/Allergies: Negative.  Does not bruise/bleed easily.       Diabetes, under good control  Psychiatric/Behavioral: Negative.  Negative for depression and memory loss. The patient is not nervous/anxious and does not have insomnia.   All other systems reviewed and are negative.  Performance status (ECOG): 0-1  Vitals:  Blood pressure (!) 145/85, pulse (!) 101, temperature 97.6 F (36.4 C), temperature source Tympanic, weight 126 lb (57.2 kg), SpO2 95 %.  Physical Exam Vitals and nursing note reviewed.  Constitutional:      General: She is not  in acute distress.    Appearance: She is not diaphoretic.  HENT:     Head: Atraumatic.     Comments: Dark hair.    Mouth/Throat:     Mouth: Mucous membranes are moist.     Pharynx: Oropharynx is clear.  Eyes:     General: No scleral icterus.    Conjunctiva/sclera: Conjunctivae normal.  Cardiovascular:     Rate and Rhythm: Normal rate and regular rhythm.     Pulses: Normal pulses.     Heart sounds: Normal heart sounds.  Pulmonary:     Effort: Pulmonary effort is normal. No respiratory distress.     Breath sounds: No wheezing or rales.     Comments: Decreased breath sound bilaterally Abdominal:     General: There is no distension.     Palpations: Abdomen is soft. There is no hepatomegaly or splenomegaly.     Tenderness: There is no abdominal tenderness.  Musculoskeletal:        General: No swelling.  Lymphadenopathy:     Head:     Right side of head: No preauricular, posterior auricular or occipital adenopathy.     Left side of head: No preauricular, posterior auricular or occipital adenopathy.     Cervical: No cervical adenopathy.     Upper Body:     Right upper body: No supraclavicular or axillary adenopathy.     Left upper body: No supraclavicular or axillary adenopathy.     Lower Body: No right inguinal adenopathy. No left inguinal adenopathy.  Skin:    General: Skin is warm and dry.  Neurological:     Mental Status: She is alert and oriented to person, place, and time.  Psychiatric:        Mood and Affect: Mood normal.   RADIOGRAPHIC STUDIES: I have personally reviewed the radiological images as listed and agreed with the findings in the report. DG Chest 2 View  Result Date: 06/26/2021 CLINICAL DATA:  Shortness of breath EXAM: CHEST - 2 VIEW COMPARISON:  06/11/2021 FINDINGS: Background emphysema. Persistent but improved opacities at the right greater than left lung bases. No significant pleural effusion. No pneumothorax. Stable cardiomediastinal contours with  normal  heart size. IMPRESSION: Persistent but improved pneumonia at the right greater than left lung bases. Electronically Signed   By: Macy Mis M.D.   On: 06/26/2021 13:24   DG Chest 2 View  Result Date: 04/30/2021 CLINICAL DATA:  Shortness of breath. EXAM: CHEST - 2 VIEW COMPARISON:  March 31, 2021 FINDINGS: Emphysematous lung disease is seen involving the bilateral upper lobes. Stable, mild to moderate severity linear scarring and/or atelectasis is seen within the right lung base. A mild amount of superimposed right basilar infiltrate is noted. This represents a new finding. There is no evidence of a pleural effusion or pneumothorax. The heart size and mediastinal contours are within normal limits. The visualized skeletal structures are unremarkable. IMPRESSION: 1. Stable, mild to moderate severity right basilar linear scarring and/or atelectasis. 2. Mild superimposed right basilar infiltrate. Electronically Signed   By: Virgina Norfolk M.D.   On: 04/30/2021 21:47   CT Angio Chest Pulmonary Embolism (PE) W or WO Contrast  Result Date: 06/26/2021 CLINICAL DATA:  Difficulty breathing, COPD EXAM: CT ANGIOGRAPHY CHEST WITH CONTRAST TECHNIQUE: Multidetector CT imaging of the chest was performed using the standard protocol during bolus administration of intravenous contrast. Multiplanar CT image reconstructions and MIPs were obtained to evaluate the vascular anatomy. CONTRAST:  65m OMNIPAQUE IOHEXOL 350 MG/ML SOLN COMPARISON:  05/01/2021 FINDINGS: Cardiovascular: There is homogeneous enhancement in thoracic aorta. There are no intraluminal filling defects in the central pulmonary artery branches. There is slightly inhomogeneous attenuation in the small peripheral branches in the both lower lobes. In the image 59 of series 4, there is questionable exophytic low-density in the anterior margin of 1 of the segmental branches in the left lower lobe which could not be distinctly seen in the adjacent images  suggesting possible artifact. Evaluation is limited by motion artifacts in both lungs and infiltrates/scarring in the right lung. Mediastinum/Nodes: No new significant lymphadenopathy seen Lungs/Pleura: Marked bronchospasm seen in the previous study is not evident in the current study. Centrilobular and panlobular emphysema is seen in both lungs. There is small patchy infiltrate in lateral aspect of right apex with no significant change. There is focal alveolar density in the posterior segment of right upper lobe with no significant change. There are patchy infiltrates in the right lower lobe with no significant interval change. Increased interstitial markings are seen in the right lung with no significant change suggesting scarring. There are no new focal pulmonary infiltrates. There is no significant pleural effusion or pneumothorax. Upper Abdomen: There is fatty infiltration in the liver Musculoskeletal: Unremarkable Review of the MIP images confirms the above findings. IMPRESSION: There is no evidence central pulmonary artery embolism. Evaluation of small peripheral pulmonary artery branches is limited by motion artifacts and infiltrates, especially in the lower lobes. There is no evidence of right ventricular strain. COPD. There are patchy infiltrates and fibrotic changes in the right lung with no significant interval change. There is no pleural effusion or pneumothorax. Electronically Signed   By: PElmer PickerM.D.   On: 06/26/2021 16:51   CT Angio Chest Pulmonary Embolism (PE) W or WO Contrast  Result Date: 05/02/2021 CLINICAL DATA:  Shortness of breath EXAM: CT ANGIOGRAPHY CHEST WITH CONTRAST TECHNIQUE: Multidetector CT imaging of the chest was performed using the standard protocol during bolus administration of intravenous contrast. Multiplanar CT image reconstructions and MIPs were obtained to evaluate the vascular anatomy. CONTRAST:  718mOMNIPAQUE IOHEXOL 350 MG/ML SOLN COMPARISON:  CT chest  dated 03/31/2021 FINDINGS: Cardiovascular: Satisfactory opacification  the bilateral pulmonary arteries to the lobar level. However, evaluation is constrained by respiratory motion, particularly in the lower lobes. Within that constraint, there is no convincing evidence of pulmonary embolism. Suspected artifact within a segmental right lower lobe pulmonary artery (series 8/image 167) in an area of motion degradation. Although not tailored for evaluation of the thoracic aorta, there is no evidence of thoracic aortic aneurysm or dissection. Atherosclerotic calcifications of the aortic arch. Heart is normal in size.  No pericardial effusion. Mediastinum/Nodes: No suspicious mediastinal lymphadenopathy. Visualized thyroid is unremarkable. Lungs/Pleura: Scarring in the right lung apex (series 9/image 16). Platelike scarring in the inferior right middle lobe (series 9/image 46). Scarring/radiation changes in the right lower lobe (series 9/image 60). These findings are unchanged from the prior. Moderate centrilobular and paraseptal emphysematous changes, upper lung predominant. No suspicious pulmonary nodules. Mild bibasilar atelectasis. No pleural effusion or pneumothorax. Upper Abdomen: Visualized upper abdomen is grossly unremarkable. Musculoskeletal: Post treatment changes involving the right lateral 7th rib (series 7/image 48), unchanged. Mild degenerative changes of the lower thoracic spine. Review of the MIP images confirms the above findings. IMPRESSION: No evidence of central pulmonary embolism, noting motion degradation. Scarring/radiation changes in the right hemithorax, unchanged. Aortic Atherosclerosis (ICD10-I70.0) and Emphysema (ICD10-J43.9). Electronically Signed   By: Julian Hy M.D.   On: 05/02/2021 00:13   DG Chest Portable 1 View  Result Date: 06/11/2021 CLINICAL DATA:  Shortness of breath, wheezing. EXAM: PORTABLE CHEST 1 VIEW COMPARISON:  Chest radiograph 05/06/2021; CT chest 05/01/2021  FINDINGS: Upper lobe predominant emphysematous changes with reticulation at the lung bases. Interstitial and patchy airspace opacities in the mid and lower lungs, right greater than left. No pleural effusion or pneumothorax. Scarring at the right apex. Heart is normal in size. No acute osseous abnormality. IMPRESSION: Interstitial and patchy airspace opacities in the mid and lower lungs, right-greater-than-left. Findings are concerning for multifocal pneumonia in the appropriate clinical context. Emphysema (ICD10-J43.9). Electronically Signed   By: Ileana Roup M.D.   On: 06/11/2021 14:20   DG Chest Port 1 View  Result Date: 05/06/2021 CLINICAL DATA:  Dyspnea. EXAM: PORTABLE CHEST 1 VIEW COMPARISON:  May 01, 2021. FINDINGS: The heart size and mediastinal contours are within normal limits. Left lung is clear. Slightly decreased right basilar opacity is noted suggesting improving atelectasis or infiltrate. The visualized skeletal structures are unremarkable. IMPRESSION: Slightly decreased right basilar opacity as described above. Electronically Signed   By: Marijo Conception M.D.   On: 05/06/2021 14:40   DG Chest Port 1 View  Result Date: 05/01/2021 CLINICAL DATA:  Hypoxia EXAM: PORTABLE CHEST 1 VIEW COMPARISON:  04/30/2021 FINDINGS: Right lower lung scarring. Subpleural reticulation/fibrosis at the lung bases. No pleural effusion or pneumothorax. The heart is normal in size. IMPRESSION: Bilateral lower lung scarring. No evidence of acute cardiopulmonary disease. Electronically Signed   By: Julian Hy M.D.   On: 05/01/2021 21:15   VAS [name] ABI WITH/WO TBI  Result Date: 05/30/2021  LOWER EXTREMITY DOPPLER STUDY Patient Name:  KATERIN NEGRETE  Date of Exam:   05/27/2021 Medical Rec #: [id number]     Accession #:    [id number] Date of Birth: 1955/10/01     Patient Gender: F Patient Age:   4 years Exam Location:  [facility] Procedure:      VAS [name] ABI WITH/WO TBI Referring Phys: [name]  --------------------------------------------------------------------------------  Indications: Peripheral artery disease, and 09/29/2018 left SFA to pop PTA and  stents.  Vascular Interventions: 06/01/16: PTA/stent of left distal SFA/popliteal                         arteries;                         06/08/16: PTA of left SFA. Comparison Study: 11/22/2020 Performing Technologist: Charlane Ferretti RT (R)(VS)  Examination Guidelines: A complete evaluation includes at minimum, Doppler waveform signals and systolic blood pressure reading at the level of bilateral brachial, anterior tibial, and posterior tibial arteries, when vessel segments are accessible. Bilateral testing is considered an integral part of a complete examination. Photoelectric Plethysmograph (PPG) waveforms and toe systolic pressure readings are included as required and additional duplex testing as needed. Limited examinations for reoccurring indications may be performed as noted.  ABI Findings: +---------+------------------+-----+----------+--------+ Right    Rt Pressure (mmHg)IndexWaveform  Comment  +---------+------------------+-----+----------+--------+ Brachial 156                                       +---------+------------------+-----+----------+--------+ ATA      116                    monophasic         +---------+------------------+-----+----------+--------+ PTA      131               0.84 monophasic         +---------+------------------+-----+----------+--------+ Ellen Henri               0.72 Abnormal           +---------+------------------+-----+----------+--------+ +---------+------------------+-----+----------+-------+ Left     Lt Pressure (mmHg)IndexWaveform  Comment +---------+------------------+-----+----------+-------+ Brachial 156                                      +---------+------------------+-----+----------+-------+ ATA      109                    monophasic         +---------+------------------+-----+----------+-------+ PTA      107               0.69 monophasic        +---------+------------------+-----+----------+-------+ Great Toe69                0.44 Abnormal          +---------+------------------+-----+----------+-------+ +-------+-----------+-----------+------------+------------+ ABI/TBIToday's ABIToday's TBIPrevious ABIPrevious TBI +-------+-----------+-----------+------------+------------+ Right  .84        .72        .90         .81          +-------+-----------+-----------+------------+------------+ Left   .70        .44        .79         .68          +-------+-----------+-----------+------------+------------+ Bilateral ABIs appear essentially unchanged compared to prior study on 11/22/2020. Bilateral TBIs appear essentially unchanged compared to prior study on 11/22/2020.  Summary: Right: Resting right ankle-brachial index indicates mild right lower extremity arterial disease. The right toe-brachial index is normal. Left: Resting left ankle-brachial index indicates moderate left lower extremity arterial disease. The left toe-brachial index is abnormal. Left TBI sppears slightly decreased as compared to  the previous exam on 11/22/2020.  *See table(s) above for measurements and observations.  Electronically signed by [name] MD on 05/30/2021 at 8:51:47 AM.    Final    ECHOCARDIOGRAM COMPLETE  Result Date: 04/03/2021    ECHOCARDIOGRAM REPORT   Patient Name:   SHAKITA KEIR [facility] Date of Exam: 04/03/2021 Medical Rec #:  [id number]    Height:       59.0 in Accession #:    [id number]   Weight:       104.3 lb Date of Birth:  February 05, 1956    BSA:          1.398 m Patient Age:    79 years     BP:           102/79 mmHg Patient Gender: F            HR:           88 bpm. Exam Location:  [facility] Procedure: 2D Echo, Cardiac Doppler and Color Doppler Indications:     Dyspnea R06.00  History:         Patient has prior history of Echocardiogram examinations, most                   recent 08/09/2017. COPD, Signs/Symptoms:Shortness of Breath;                  Risk Factors:Hypertension.  Sonographer:     Sherrie Sport Referring Phys:  [id number] CHRISTOPHER P DANFORD Diagnosing Phys: Ida Rogue MD  Sonographer Comments: Technically challenging study due to limited acoustic windows and no apical window. Image acquisition challenging due to COPD and Best image view was subcostal. IMPRESSIONS  1. Challenging images  2. Left ventricular ejection fraction, by estimation, is 45 to 50%. The left ventricle has mildly decreased function. The left ventricle demonstrates global hypokinesis. Left ventricular diastolic parameters are indeterminate.  3. Right ventricular systolic function is mildly reduced. The right ventricular size is normal. FINDINGS  Left Ventricle: Left ventricular ejection fraction, by estimation, is 45 to 50%. The left ventricle has mildly decreased function. The left ventricle demonstrates global hypokinesis. The left ventricular internal cavity size was normal in size. There is  no left ventricular hypertrophy. Left ventricular diastolic parameters are indeterminate. Right Ventricle: The right ventricular size is normal. No increase in right ventricular wall thickness. Right ventricular systolic function is mildly reduced. Left Atrium: Left atrial size was normal in size. Right Atrium: Right atrial size was normal in size. Pericardium: There is no evidence of pericardial effusion. Mitral Valve: The mitral valve was not well visualized. No evidence of mitral valve regurgitation. No evidence of mitral valve stenosis. Tricuspid Valve: The tricuspid valve is not well visualized. Tricuspid valve regurgitation is not demonstrated. No evidence of tricuspid stenosis. Aortic Valve: The aortic valve was not well visualized. Aortic valve regurgitation is not visualized. Mild to moderate aortic valve sclerosis/calcification is present, without any evidence of aortic stenosis.  Pulmonic Valve: The pulmonic valve was not well visualized. Pulmonic valve regurgitation is not visualized. No evidence of pulmonic stenosis. There is no evidence of pulmonic valve vegetation. Aorta: The aortic root is normal in size and structure. Venous: The pulmonary veins were not well visualized. The inferior vena cava is normal in size with greater than 50% respiratory variability, suggesting right atrial pressure of 3 mmHg. IAS/Shunts: No atrial level shunt detected by color flow Doppler.  LEFT VENTRICLE PLAX 2D LVIDd:         3.28  cm LVIDs:         2.40 cm LV PW:         0.89 cm LV IVS:        1.18 cm LVOT diam:     2.00 cm LVOT Area:     3.14 cm  LEFT ATRIUM         Index LA diam:    2.70 cm 1.93 cm/m                        PULMONIC VALVE AORTA                 PV Vmax:       0.28 m/s Ao Root diam: 2.70 cm PV Peak grad:  0.3 mmHg   SHUNTS Systemic Diam: 2.00 cm Ida Rogue MD Electronically signed by Ida Rogue MD Signature Date/Time: 04/03/2021/3:00:00 PM    Final      Assessment and Plan: 1. Malignant neoplasm of upper lobe of right lung ([place])   2. Cancer of lower lobe of right lung ([place])   3. 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 -Recurrence-SBRT completed 06/11/2020. Foundation one NGS showed TMB >10 muts/mb, MS stable, KRAS G12V, MYC amplification,STK11 V013HY*38, FUBP1 splice sit 887+5Z>V Patient recently had CT scan done on 03/31/2021, 7 mm lung nodule was stable on that CT scan. She also had a CT scanning on 06/26/2021 and 05/01/2021. No pulmonary embolism.  Pulmonary nodules were not reported. I reviewed images independently.  Pulmonary nodules appears to be similar size.    # Iron deficiency anemia Hemoglobin is decreased at 9.8, potentially due to recent infection and blood loss from  admission. Recommend patient to continue oral iron supplementation twice daily.  Follow-up in 6 months after repeat CT scan. Earlie Server, MD, PhD  07/01/2021  Addendum, she was admitted to [facility] 07/10/21- 07/15/21  She had repeat CT chest angiogram done on 07/11/2021 which showed Slightly increased in size and conspicuity over several months of larger than 1 cm nodular-like densities along the right minor and major fissures in the region of prior radiation therapy. Similarly slight interval increase in size of the right apex subpleural nodule. Recurrent malignancy cannot be excluded  Will obtain PET scan for further evaluation. Follow up after PET  Earlie Server

## 2021-07-02 ENCOUNTER — Other Ambulatory Visit: Payer: Self-pay

## 2021-07-02 ENCOUNTER — Ambulatory Visit
Admission: RE | Admit: 2021-07-02 | Discharge: 2021-07-02 | Disposition: A | Discharge status: Home / Self Care | Payer: Medicare Other | Source: Ambulatory Visit | Attending: Radiation Oncology | Admitting: Radiation Oncology

## 2021-07-02 VITALS — BP 179/86 | HR 102 | Temp 97.8°F | Resp 18 | Wt 125.6 lb

## 2021-07-02 DIAGNOSIS — C3431 Malignant neoplasm of lower lobe, right bronchus or lung: Secondary | ICD-10-CM

## 2021-07-02 DIAGNOSIS — Z923 Personal history of irradiation: Secondary | ICD-10-CM | POA: Diagnosis not present

## 2021-07-02 NOTE — Progress Notes (Signed)
Radiation Oncology Follow up Note  Name: Angela Jensen   Date:   07/02/2021 MRN:  [id number] DOB: 11-23-1955    This 65 y.o. female presents to the clinic today for 1 year follow-up status post SBRT to her right lower lobe for multinodular adenocarcinoma and patient previously treated to her right upper lobe with SBRT.  REFERRING PROVIDER: [name], MD  HPI: Patient is a 65 year old female has had multiple SBRT's for adenocarcinoma of both the right lower lobe and right upper lobe.  Seen today in routine follow-up she is doing well.  She has a mild nonproductive cough no exacerbation of shortness of breath or hemoptysis..  She had a recent CT scan of the chest showing no evidence of pulmonary artery embolism.  There is also some patchy infiltrates and fibrotic changes in the right lung with no significant interval change consistent with treatment response.  She is switched to transfer her care from Dr. Mike Gip to Dr. Tasia Catchings.  COMPLICATIONS OF TREATMENT: none  FOLLOW UP COMPLIANCE: keeps appointments   PHYSICAL EXAM:  BP (!) 179/86 (BP Location: Left Arm, Patient Position: Sitting)   Pulse (!) 102   Temp 97.8 F (36.6 C) (Tympanic)   Resp 18   Wt 125 lb 9.6 oz (57 kg)   BMI 25.37 kg/m  Well-developed well-nourished patient in NAD. HEENT reveals PERLA, EOMI, discs not visualized.  Oral cavity is clear. No oral mucosal lesions are identified. Neck is clear without evidence of cervical or supraclavicular adenopathy. Lungs are clear to A&P. Cardiac examination is essentially unremarkable with regular rate and rhythm without murmur rub or thrill. Abdomen is benign with no organomegaly or masses noted. Motor sensory and DTR levels are equal and symmetric in the upper and lower extremities. Cranial nerves II through XII are grossly intact. Proprioception is intact. No peripheral adenopathy or edema is identified. No motor or sensory levels are noted. Crude visual fields are within normal  range.  RADIOLOGY RESULTS: CT scans reviewed compatible with above-stated findings  PLAN: Present time patient is doing well excellent result from SBRT by CT criteria.  I am pleased with her overall progress.  Of asked to see her back in 6 months for follow-up with a CT scan if has not been performed at that time.  She continues close follow-up care with Dr. Tasia Catchings.  Patient knows to call with any concerns.  I would like to take this opportunity to thank you for allowing me to participate in the care of your patient.Noreene Filbert, MD

## 2021-07-03 ENCOUNTER — Encounter: Payer: Self-pay | Admitting: Internal Medicine

## 2021-07-03 ENCOUNTER — Ambulatory Visit: Payer: Medicare Other | Admitting: Internal Medicine

## 2021-07-03 ENCOUNTER — Other Ambulatory Visit: Payer: Self-pay

## 2021-07-03 VITALS — BP 149/97 | HR 86 | Temp 98.5°F | Resp 16 | Ht 59.0 in | Wt 128.4 lb

## 2021-07-03 DIAGNOSIS — C3431 Malignant neoplasm of lower lobe, right bronchus or lung: Secondary | ICD-10-CM | POA: Diagnosis not present

## 2021-07-03 DIAGNOSIS — J181 Lobar pneumonia, unspecified organism: Secondary | ICD-10-CM | POA: Diagnosis not present

## 2021-07-03 DIAGNOSIS — J449 Chronic obstructive pulmonary disease, unspecified: Secondary | ICD-10-CM

## 2021-07-03 NOTE — Progress Notes (Signed)
[facility] [place], [place] [address]  Pulmonary Sleep Medicine   Office Visit Note  Patient Name: Angela Jensen DOB: June 17, 1956 MRN [id number]  Date of Service: 07/03/2021  Complaints/HPI: Admitted to the hospital on 11/10 for flare up of breathing. She was in for 3 days treated for infection. Now states she feels better. Denies having chest pain or palpitations. O cough at this time. Occasional SOB. She is using her oxygen as prescribed. States it benefits her with the breathing.   ROS  General: (-) fever, (-) chills, (-) night sweats, (-) weakness Skin: (-) rashes, (-) itching,. Eyes: (-) visual changes, (-) redness, (-) itching. Nose and Sinuses: (-) nasal stuffiness or itchiness, (-) postnasal drip, (-) nosebleeds, (-) sinus trouble. Mouth and Throat: (-) sore throat, (-) hoarseness. Neck: (-) swollen glands, (-) enlarged thyroid, (-) neck pain. Respiratory: - cough, (-) bloody sputum, - shortness of breath, - wheezing. Cardiovascular: - ankle swelling, (-) chest pain. Lymphatic: (-) lymph node enlargement. Neurologic: (-) numbness, (-) tingling. Psychiatric: (-) anxiety, (-) depression   Current Medication: Outpatient Encounter Medications as of 07/03/2021  Medication Sig   Accu-Chek Softclix Lancets lancets Use as instructed for twice a daily Dx E11.65   albuterol (PROVENTIL) (2.5 MG/3ML) 0.083% nebulizer solution USE 1 VIAL IN NEBULIZER EVERY 6 HOURS AS NEEDED FOR WHEEZING   albuterol (VENTOLIN HFA) 108 (90 Base) MCG/ACT inhaler INHALE 2 PUFFS BY MOUTH EVERY 6 HOURS AS NEEDED FOR WHEEZING FOR SHORTNESS OF BREATH   ALPRAZolam (XANAX) 0.25 MG tablet Take 1 tablet (0.25 mg total) by mouth 3 (three) times daily as needed for anxiety or sleep.   Blood Glucose Monitoring Suppl (ACCU-CHEK GUIDE) w/Device KIT Use as directed Dx e11.65   carvedilol (COREG) 3.125 MG tablet Take 1 tablet (3.125 mg total) by mouth 2 (two) times daily with a meal.    clopidogrel (PLAVIX) 75 MG tablet Take 1 tablet by mouth once daily   doxycycline (VIBRA-TABS) 100 MG tablet Take 1 tablet (100 mg total) by mouth every 12 (twelve) hours for 9 doses.   escitalopram (LEXAPRO) 10 MG tablet Take 1 tablet (10 mg total) by mouth daily.   ferrous sulfate 325 (65 FE) MG tablet Take 325 mg by mouth 2 (two) times daily with a meal.   fluticasone (FLONASE) 50 MCG/ACT nasal spray Place 2 sprays into both nostrils daily.   gabapentin (NEURONTIN) 100 MG capsule Take 1 capsule (100 mg total) by mouth 2 (two) times daily.   glucose blood (ACCU-CHEK GUIDE) test strip USE  STRIP TO CHECK GLUCOSE TWICE DAILY   insulin detemir (LEVEMIR FLEXTOUCH) 100 UNIT/ML FlexPen Inject 8- 12 units with supper qd// with needles   lisinopril (ZESTRIL) 10 MG tablet Take 1 tablet (10 mg total) by mouth daily.   metFORMIN (GLUCOPHAGE) 500 MG tablet Take 1 tablet (500 mg total) by mouth daily with breakfast.   mometasone-formoterol (DULERA) 200-5 MCG/ACT AERO Inhale 2 puffs into the lungs 2 (two) times daily.   Omega-3 Fatty Acids (FISH OIL) 1000 MG CAPS Take 1 capsule by mouth daily.   OXYGEN Inhale into the lungs.   potassium chloride (KLOR-CON) 10 MEQ tablet TAKE 1 TABLET BY MOUTH EVERY OTHER DAY   predniSONE (DELTASONE) 20 MG tablet 2 tabs po daily for three more days   tiotropium (SPIRIVA HANDIHALER) 18 MCG inhalation capsule INHALE THE CONTENTS OF ONE CAPSULE BY MOUTH ONCE DAILY. DO NOT SWALLOW CAPSULE.   famotidine (PEPCID) 20 MG tablet Take 1 tablet (  20 mg total) by mouth daily for 14 days.   No facility-administered encounter medications on file as of 07/03/2021.    Surgical History: Past Surgical History:  Procedure Laterality Date   CESAREAN SECTION     x3   COLONOSCOPY WITH PROPOFOL N/A 06/25/2015   Procedure: COLONOSCOPY WITH PROPOFOL;  Surgeon: Lucilla Lame, MD;  Location: [facility];  Service: Endoscopy;  Laterality: N/A;   COLONOSCOPY WITH PROPOFOL N/A 07/26/2020    Procedure: COLONOSCOPY WITH PROPOFOL;  Surgeon: Lucilla Lame, MD;  Location: [place];  Service: Endoscopy;  Laterality: N/A;   CYST REMOVAL LEG     and on shoulder    ESOPHAGOGASTRODUODENOSCOPY (EGD) WITH PROPOFOL N/A 07/26/2020   Procedure: ESOPHAGOGASTRODUODENOSCOPY (EGD) WITH PROPOFOL;  Surgeon: Lucilla Lame, MD;  Location: [place];  Service: Endoscopy;  Laterality: N/A;  Diabetic - oral meds   LOWER EXTREMITY ANGIOGRAPHY Left 09/29/2018   Procedure: LOWER EXTREMITY ANGIOGRAPHY;  Surgeon: Algernon Huxley, MD;  Location: [place] [facility];  Service: Cardiovascular;  Laterality: Left;   LUNG BIOPSY  05 15 2013   has lung "spots"   PERIPHERAL VASCULAR CATHETERIZATION Left 06/01/2016   Procedure: Lower Extremity Angiography;  Surgeon: Algernon Huxley, MD;  Location: [place] [facility];  Service: Cardiovascular;  Laterality: Left;   PERIPHERAL VASCULAR CATHETERIZATION N/A 06/01/2016   Procedure: Abdominal Aortogram w/Lower Extremity;  Surgeon: Algernon Huxley, MD;  Location: [place] [facility];  Service: Cardiovascular;  Laterality: N/A;   PERIPHERAL VASCULAR CATHETERIZATION  06/01/2016   Procedure: Lower Extremity Intervention;  Surgeon: Algernon Huxley, MD;  Location: [place] [facility];  Service: Cardiovascular;;   PERIPHERAL VASCULAR CATHETERIZATION Right 06/08/2016   Procedure: Lower Extremity Angiography;  Surgeon: Algernon Huxley, MD;  Location: [place] [facility];  Service: Cardiovascular;  Laterality: Right;   PERIPHERAL VASCULAR CATHETERIZATION  06/08/2016   Procedure: Lower Extremity Intervention;  Surgeon: Algernon Huxley, MD;  Location: [place] [facility];  Service: Cardiovascular;;   SUBMANDIBULAR GLAND EXCISION Left 12/06/2020   Procedure: EXCISION SUBMANDIBULAR GLAND;  Surgeon: Beverly Gust, MD;  Location: [place];  Service: ENT;  Laterality: Left;  needs to be first case Diabetic - diet controlled    Medical History: Past Medical History:   Diagnosis Date   Anemia    Arthritis    Asthma    Atherosclerosis of native arteries of extremity with intermittent claudication ([place]) 05/26/2016   Cancer ([place]) 2012   Right Lung CA   COPD (chronic obstructive pulmonary disease) ([place])    Depression    Diabetes mellitus without complication ([place])    Patient takes Janumet   Essential hypertension 05/26/2016   Hypercholesteremia    Oxygen dependent    2L at nite    PAD (peripheral artery disease) ([place]) 06/22/2016   Peripheral vascular disease ([place])    Personal history of radiation therapy    Shortness of breath dyspnea    with exertion    Sialolithiasis    Sleep apnea    Wears dentures    full upper and lower    Family History: Family History  Problem Relation Age of Onset   Lung cancer Father    Diabetes Mother    Hypercholesterolemia Mother    Diabetes Sister    Diabetes Maternal Grandmother    Diabetes Paternal Grandmother    Diabetes Sister    Heart attack Brother    Coronary artery disease Brother    Vascular Disease Brother  Hypertension Sister    Heart attack Brother     Social History: Social History   Socioeconomic History   Marital status: Widowed    Spouse name: Not on file   Number of children: Not on file   Years of education: Not on file   Highest education level: Not on file  Occupational History   Not on file  Tobacco Use   Smoking status: Former    Packs/day: 1.00    Years: 37.00    Pack years: 37.00    Types: Cigarettes    Quit date: 02/06/2010    Years since quitting: 11.4   Smokeless tobacco: Former    Types: Snuff  Vaping Use   Vaping Use: Never used  Substance and Sexual Activity   Alcohol use: Not Currently    Alcohol/week: 5.0 standard drinks    Types: 5 Cans of beer per week    Comment: /h x of alcohol abuse -stopped 2012- now drinks 5 beer per week   Drug use: Not Currently    Types: Marijuana, "Crack" cocaine, Cocaine    Comment: hx of cocaine use- last use 2015; last  use marijuana6/22/19,   Sexual activity: Yes  Other Topics Concern   Not on file  Social History Narrative   Lives with Significant Other x 43 years   Social Determinants of Health   Financial Resource Strain: Not on file  Food Insecurity: Not on file  Transportation Needs: Not on file  Physical Activity: Not on file  Stress: Not on file  Social Connections: Not on file  Intimate Partner Violence: Not on file    Vital Signs: Blood pressure (!) 149/97, pulse 86, temperature 98.5 F (36.9 C), resp. rate 16, height _0  (1.499 m), weight 128 lb 6.4 oz (58.2 kg), SpO2 99 %.  Examination: General Appearance: The patient is well-developed, well-nourished, and in no distress. Skin: Gross inspection of skin unremarkable. Head: normocephalic, no gross deformities. Eyes: no gross deformities noted. ENT: ears appear grossly normal no exudates. Neck: Supple. No thyromegaly. No LAD. Respiratory: no rhonchi noted. Cardiovascular: Normal S1 and S2 without murmur or rub. Extremities: No cyanosis. pulses are equal. Neurologic: Alert and oriented. No involuntary movements.  LABS: Recent Results (from the past 2160 hour(s))  Glucose, capillary     Status: Abnormal   Collection Time: 04/04/21 12:37 PM  Result Value Ref Range   Glucose-Capillary 351 (H) 70 - 99 mg/dL    Comment: Glucose reference range applies only to samples taken after fasting for at least 8 hours.  Glucose, capillary     Status: Abnormal   Collection Time: 04/04/21  4:30 PM  Result Value Ref Range   Glucose-Capillary 356 (H) 70 - 99 mg/dL    Comment: Glucose reference range applies only to samples taken after fasting for at least 8 hours.  Glucose, capillary     Status: Abnormal   Collection Time: 04/04/21  8:51 PM  Result Value Ref Range   Glucose-Capillary 305 (H) 70 - 99 mg/dL    Comment: Glucose reference range applies only to samples taken after fasting for at least 8 hours.   Comment 1 Notify RN   Basic  metabolic panel     Status: Abnormal   Collection Time: 04/05/21  4:30 AM  Result Value Ref Range   Sodium 137 135 - 145 mmol/L   Potassium 3.9 3.5 - 5.1 mmol/L   Chloride 100 98 - 111 mmol/L   CO2 34 (H) 22 - 32  mmol/L   Glucose, Bld 204 (H) 70 - 99 mg/dL    Comment: Glucose reference range applies only to samples taken after fasting for at least 8 hours.   BUN 23 8 - 23 mg/dL   Creatinine, Ser 2.91 0.44 - 1.00 mg/dL   Calcium 8.6 (L) 8.9 - 10.3 mg/dL   GFR, Estimated >96 >15 mL/min    Comment: (NOTE) Calculated using the CKD-EPI Creatinine Equation (2021)    Anion gap 3 (L) 5 - 15    Comment: Performed at [facility], [address] [place], [place] [address]  CBC     Status: Abnormal   Collection Time: 04/05/21  4:30 AM  Result Value Ref Range   WBC 8.8 4.0 - 10.5 K/uL   RBC 3.30 (L) 3.87 - 5.11 MIL/uL   Hemoglobin 9.7 (L) 12.0 - 15.0 g/dL   HCT 72.1 (L) 13.7 - 55.2 %   MCV 93.6 80.0 - 100.0 fL   MCH 29.4 26.0 - 34.0 pg   MCHC 31.4 30.0 - 36.0 g/dL   RDW 05.6 94.5 - 72.0 %   Platelets 259 150 - 400 K/uL   nRBC 0.2 0.0 - 0.2 %    Comment: Performed at [facility], [address], [place], [place] [address]  Magnesium     Status: None   Collection Time: 04/05/21  4:30 AM  Result Value Ref Range   Magnesium 2.3 1.7 - 2.4 mg/dL    Comment: Performed at [facility], [address] [place], [place] [address]  Glucose, capillary     Status: Abnormal   Collection Time: 04/05/21  7:43 AM  Result Value Ref Range   Glucose-Capillary 173 (H) 70 - 99 mg/dL    Comment: Glucose reference range applies only to samples taken after fasting for at least 8 hours.  Glucose, capillary     Status: Abnormal   Collection Time: 04/05/21 12:40 PM  Result Value Ref Range   Glucose-Capillary 291 (H) 70 - 99 mg/dL    Comment: Glucose reference range applies only to samples taken after fasting for at least 8 hours.  CULTURE, URINE COMPREHENSIVE     Status: None    Collection Time: 04/22/21  9:56 AM   Specimen: Urine   Urine  Result Value Ref Range   Urine Culture, Comprehensive Final report    Organism ID, Bacteria Comment     Comment: Mixed urogenital flora 1,000 Colonies/mL   POCT Urinalysis Dipstick     Status: Abnormal   Collection Time: 04/22/21  9:59 AM  Result Value Ref Range   Color, UA     Clarity, UA     Glucose, UA Negative Negative   Bilirubin, UA negative    Ketones, UA negative    Spec Grav, UA 1.010 1.010 - 1.025   Blood, UA neg    pH, UA 6.0 5.0 - 8.0   Protein, UA Positive (A) Negative    Comment: +100   Urobilinogen, UA 0.2 0.2 or 1.0 E.U./dL   Nitrite, UA neg    Leukocytes, UA Negative Negative   Appearance     Odor    Basic metabolic panel     Status: Abnormal   Collection Time: 04/30/21  9:47 PM  Result Value Ref Range   Sodium 134 (L) 135 - 145 mmol/L   Potassium 3.6 3.5 - 5.1 mmol/L    Comment: HEMOLYSIS AT THIS LEVEL MAY AFFECT RESULT   Chloride 100 98 - 111 mmol/L  CO2 27 22 - 32 mmol/L   Glucose, Bld 174 (H) 70 - 99 mg/dL    Comment: Glucose reference range applies only to samples taken after fasting for at least 8 hours.   BUN 19 8 - 23 mg/dL   Creatinine, Ser 0.60 0.44 - 1.00 mg/dL   Calcium 8.6 (L) 8.9 - 10.3 mg/dL   GFR, Estimated >60 >60 mL/min    Comment: (NOTE) Calculated using the CKD-EPI Creatinine Equation (2021)    Anion gap 7 5 - 15    Comment: Performed at [facility], [address] [place], [place] [address]  CBC     Status: Abnormal   Collection Time: 04/30/21  9:47 PM  Result Value Ref Range   WBC 6.2 4.0 - 10.5 K/uL   RBC 3.46 (L) 3.87 - 5.11 MIL/uL   Hemoglobin 10.1 (L) 12.0 - 15.0 g/dL   HCT 31.8 (L) 36.0 - 46.0 %   MCV 91.9 80.0 - 100.0 fL   MCH 29.2 26.0 - 34.0 pg   MCHC 31.8 30.0 - 36.0 g/dL   RDW 13.4 11.5 - 15.5 %   Platelets 273 150 - 400 K/uL   nRBC 0.0 0.0 - 0.2 %    Comment: Performed at [facility], [place], [place]  [address]  Troponin I (High Sensitivity)     Status: Abnormal   Collection Time: 04/30/21  9:47 PM  Result Value Ref Range   Troponin I (High Sensitivity) 35 (H) <18 ng/L    Comment: (NOTE) Elevated high sensitivity troponin I (hsTnI) values and significant  changes across serial measurements may suggest ACS but many other  chronic and acute conditions are known to elevate hsTnI results.  Refer to the "Links" section for chest pain algorithms and additional  guidance. Performed at [facility], [address] [place], [place] [address]   Brain natriuretic peptide     Status: None   Collection Time: 04/30/21  9:47 PM  Result Value Ref Range   B Natriuretic Peptide 29.3 0.0 - 100.0 pg/mL    Comment: Performed at [facility], [address] [place], [place] [address]  Resp Panel by RT-PCR (Flu A&B, Covid) Nasopharyngeal Swab     Status: None   Collection Time: 04/30/21 10:24 PM   Specimen: Nasopharyngeal Swab; Nasopharyngeal(NP) swabs in vial transport medium  Result Value Ref Range   SARS Coronavirus 2 by RT PCR NEGATIVE NEGATIVE    Comment: (NOTE) SARS-CoV-2 target nucleic acids are NOT DETECTED.  The SARS-CoV-2 RNA is generally detectable in upper respiratory specimens during the acute phase of infection. The lowest concentration of SARS-CoV-2 viral copies this assay can detect is 138 copies/mL. A negative result does not preclude SARS-Cov-2 infection and should not be used as the sole basis for treatment or other patient management decisions. A negative result may occur with  improper specimen collection/handling, submission of specimen other than nasopharyngeal swab, presence of viral mutation(s) within the areas targeted by this assay, and inadequate number of viral copies(<138 copies/mL). A negative result must be combined with clinical observations, patient history, and epidemiological information. The expected result is Negative.  Fact Sheet for  Patients:  EntrepreneurPulse.com.au  Fact Sheet for Healthcare Providers:  IncredibleEmployment.be  This test is no t yet approved or cleared by the [place] FDA and  has been authorized for detection and/or diagnosis of SARS-CoV-2 by FDA under an Emergency Use Authorization (EUA). This EUA will remain  in effect (meaning  this test can be used) for the duration of the COVID-19 declaration under Section 564(b)(1) of the Act, 21 U.S.C.section 360bbb-3(b)(1), unless the authorization is terminated  or revoked sooner.       Influenza A by PCR NEGATIVE NEGATIVE   Influenza B by PCR NEGATIVE NEGATIVE    Comment: (NOTE) The Xpert Xpress SARS-CoV-2/FLU/RSV plus assay is intended as an aid in the diagnosis of influenza from Nasopharyngeal swab specimens and should not be used as a sole basis for treatment. Nasal washings and aspirates are unacceptable for Xpert Xpress SARS-CoV-2/FLU/RSV testing.  Fact Sheet for Patients: EntrepreneurPulse.com.au  Fact Sheet for Healthcare Providers: IncredibleEmployment.be  This test is not yet approved or cleared by the [place] FDA and has been authorized for detection and/or diagnosis of SARS-CoV-2 by FDA under an Emergency Use Authorization (EUA). This EUA will remain in effect (meaning this test can be used) for the duration of the COVID-19 declaration under Section 564(b)(1) of the Act, 21 U.S.C. section 360bbb-3(b)(1), unless the authorization is terminated or revoked.  Performed at [facility], [place], [place] [address]   Troponin I (High Sensitivity)     Status: Abnormal   Collection Time: 04/30/21 11:41 PM  Result Value Ref Range   Troponin I (High Sensitivity) 43 (H) <18 ng/L    Comment: (NOTE) Elevated high sensitivity troponin I (hsTnI) values and significant  changes across serial measurements may suggest ACS but many other   chronic and acute conditions are known to elevate hsTnI results.  Refer to the "Links" section for chest pain algorithms and additional  guidance. Performed at [facility], [address] [place], [place] [address]   Magnesium     Status: None   Collection Time: 04/30/21 11:41 PM  Result Value Ref Range   Magnesium 2.0 1.7 - 2.4 mg/dL    Comment: Performed at [facility], [address] [place], [place] [address]  Procalcitonin - Baseline     Status: None   Collection Time: 04/30/21 11:41 PM  Result Value Ref Range   Procalcitonin <0.10 ng/mL    Comment:        Interpretation: PCT (Procalcitonin) <= 0.5 ng/mL: Systemic infection (sepsis) is not likely. Local bacterial infection is possible. (NOTE)       Sepsis PCT Algorithm           Lower Respiratory Tract                                      Infection PCT Algorithm    ----------------------------     ----------------------------         PCT < 0.25 ng/mL                PCT < 0.10 ng/mL          Strongly encourage             Strongly discourage   discontinuation of antibiotics    initiation of antibiotics    ----------------------------     -----------------------------       PCT 0.25 - 0.50 ng/mL            PCT 0.10 - 0.25 ng/mL               OR       >80% decrease in PCT            Discourage  initiation of                                            antibiotics      Encourage discontinuation           of antibiotics    ----------------------------     -----------------------------         PCT >= 0.50 ng/mL              PCT 0.26 - 0.50 ng/mL               AND        <80% decrease in PCT             Encourage initiation of                                             antibiotics       Encourage continuation           of antibiotics    ----------------------------     -----------------------------        PCT >= 0.50 ng/mL                  PCT > 0.50 ng/mL               AND         increase in PCT                   Strongly encourage                                      initiation of antibiotics    Strongly encourage escalation           of antibiotics                                     -----------------------------                                           PCT <= 0.25 ng/mL                                                 OR                                        > 80% decrease in PCT                                      Discontinue / Do not initiate  antibiotics  Performed at [facility], [address] [place], [place] [address]   Phosphorus     Status: None   Collection Time: 05/01/21  6:19 AM  Result Value Ref Range   Phosphorus 3.3 2.5 - 4.6 mg/dL    Comment: Performed at [facility], [address] [place], [place] [address]  Magnesium     Status: None   Collection Time: 05/01/21  6:19 AM  Result Value Ref Range   Magnesium 2.0 1.7 - 2.4 mg/dL    Comment: Performed at [facility], [address] [place], [place] [address]  Comprehensive metabolic panel     Status: Abnormal   Collection Time: 05/01/21  6:19 AM  Result Value Ref Range   Sodium 133 (L) 135 - 145 mmol/L   Potassium 4.6 3.5 - 5.1 mmol/L   Chloride 101 98 - 111 mmol/L   CO2 25 22 - 32 mmol/L   Glucose, Bld 347 (H) 70 - 99 mg/dL    Comment: Glucose reference range applies only to samples taken after fasting for at least 8 hours.   BUN 16 8 - 23 mg/dL   Creatinine, Ser 0.58 0.44 - 1.00 mg/dL   Calcium 8.8 (L) 8.9 - 10.3 mg/dL   Total Protein 6.9 6.5 - 8.1 g/dL   Albumin 3.7 3.5 - 5.0 g/dL   AST 30 15 - 41 U/L   ALT 23 0 - 44 U/L   Alkaline Phosphatase 63 38 - 126 U/L   Total Bilirubin 0.5 0.3 - 1.2 mg/dL   GFR, Estimated >60 >60 mL/min    Comment: (NOTE) Calculated using the CKD-EPI Creatinine Equation (2021)    Anion gap 7 5 - 15    Comment: Performed at [facility], [address] [place], [place] [address]   CBC     Status: Abnormal   Collection Time: 05/01/21  6:19 AM  Result Value Ref Range   WBC 8.5 4.0 - 10.5 K/uL   RBC 3.51 (L) 3.87 - 5.11 MIL/uL   Hemoglobin 10.4 (L) 12.0 - 15.0 g/dL   HCT 32.3 (L) 36.0 - 46.0 %   MCV 92.0 80.0 - 100.0 fL   MCH 29.6 26.0 - 34.0 pg   MCHC 32.2 30.0 - 36.0 g/dL   RDW 13.5 11.5 - 15.5 %   Platelets 280 150 - 400 K/uL   nRBC 0.0 0.0 - 0.2 %    Comment: Performed at [facility], [address] [place], [place] [address]  Blood gas, venous     Status: Abnormal   Collection Time: 05/01/21  6:19 AM  Result Value Ref Range   pH, Ven 7.37 7.250 - 7.430   pCO2, Ven 39 (L) 44.0 - 60.0 mmHg   pO2, Ven 131.0 (H) 32.0 - 45.0 mmHg   Bicarbonate 22.5 20.0 - 28.0 mmol/L   Acid-base deficit 2.5 (H) 0.0 - 2.0 mmol/L   O2 Saturation 98.9 %   Patient temperature 37.0    Sample type VEIN     Comment: Performed at [facility], [place], [place] [address]  Troponin I (High Sensitivity)     Status: Abnormal   Collection Time: 05/01/21  6:19 AM  Result Value Ref Range   Troponin I (High Sensitivity) 29 (H) <18 ng/L    Comment: (NOTE) Elevated high sensitivity troponin I (hsTnI) values and significant  changes across serial measurements may suggest ACS but many other  chronic and acute conditions are  known to elevate hsTnI results.  Refer to the "Links" section for chest pain algorithms and additional  guidance. Performed at [facility], [address] [place], [place] [address]   CBG monitoring, ED     Status: Abnormal   Collection Time: 05/01/21  8:20 AM  Result Value Ref Range   Glucose-Capillary 290 (H) 70 - 99 mg/dL    Comment: Glucose reference range applies only to samples taken after fasting for at least 8 hours.  CBG monitoring, ED     Status: Abnormal   Collection Time: 05/01/21 11:57 AM  Result Value Ref Range   Glucose-Capillary 332 (H) 70 - 99 mg/dL    Comment: Glucose reference range applies only  to samples taken after fasting for at least 8 hours.  Glucose, capillary     Status: Abnormal   Collection Time: 05/01/21  4:43 PM  Result Value Ref Range   Glucose-Capillary 251 (H) 70 - 99 mg/dL    Comment: Glucose reference range applies only to samples taken after fasting for at least 8 hours.  Brain natriuretic peptide     Status: None   Collection Time: 05/01/21  8:21 PM  Result Value Ref Range   B Natriuretic Peptide 77.6 0.0 - 100.0 pg/mL    Comment: Performed at [facility], [address] [place], [place] [address]  D-dimer, quantitative     Status: Abnormal   Collection Time: 05/01/21  8:21 PM  Result Value Ref Range   D-Dimer, Quant 2.38 (H) 0.00 - 0.50 ug/mL-FEU    Comment: (NOTE) At the manufacturer cut-off value of 0.5 g/mL FEU, this assay has a negative predictive value of 95-100%.This assay is intended for use in conjunction with a clinical pretest probability (PTP) assessment model to exclude pulmonary embolism (PE) and deep venous thrombosis (DVT) in outpatients suspected of PE or DVT. Results should be correlated with clinical presentation. Performed at [facility], [address] [place], [place] [address]   Glucose, capillary     Status: Abnormal   Collection Time: 05/01/21  9:31 PM  Result Value Ref Range   Glucose-Capillary 285 (H) 70 - 99 mg/dL    Comment: Glucose reference range applies only to samples taken after fasting for at least 8 hours.  Glucose, capillary     Status: Abnormal   Collection Time: 05/02/21  7:23 AM  Result Value Ref Range   Glucose-Capillary 152 (H) 70 - 99 mg/dL    Comment: Glucose reference range applies only to samples taken after fasting for at least 8 hours.  Vitamin B12     Status: None   Collection Time: 05/02/21  7:58 AM  Result Value Ref Range   Vitamin B-12 748 180 - 914 pg/mL    Comment: (NOTE) This assay is not validated for testing neonatal or myeloproliferative syndrome specimens for  Vitamin B12 levels. Performed at [facility], [place] [address], [place], [place] [address]   Folate     Status: None   Collection Time: 05/02/21  7:58 AM  Result Value Ref Range   Folate 14.8 >5.9 ng/mL    Comment: Performed at [facility], [place], [place] [address]  Iron and TIBC     Status: Abnormal   Collection Time: 05/02/21  7:58 AM  Result Value Ref Range   Iron 41 28 - 170 ug/dL   TIBC 395 250 - 450 ug/dL   Saturation Ratios 10 (L) 10.4 - 31.8 %  UIBC 354 ug/dL    Comment: Performed at [facility], [address] [place], [place] [address]  Ferritin     Status: None   Collection Time: 05/02/21  7:58 AM  Result Value Ref Range   Ferritin 21 11 - 307 ng/mL    Comment: Performed at [facility], [address] [place], [place] [address]  Reticulocytes     Status: Abnormal   Collection Time: 05/02/21  7:58 AM  Result Value Ref Range   Retic Ct Pct 2.4 0.4 - 3.1 %   RBC. 3.42 (L) 3.87 - 5.11 MIL/uL   Retic Count, Absolute 82.4 19.0 - 186.0 K/uL   Immature Retic Fract 24.2 (H) 2.3 - 15.9 %    Comment: Performed at [facility], [address] [place], [place] [address]  CBC     Status: Abnormal   Collection Time: 05/02/21  7:58 AM  Result Value Ref Range   WBC 9.7 4.0 - 10.5 K/uL   RBC 3.35 (L) 3.87 - 5.11 MIL/uL   Hemoglobin 9.9 (L) 12.0 - 15.0 g/dL   HCT 31.2 (L) 36.0 - 46.0 %   MCV 93.1 80.0 - 100.0 fL   MCH 29.6 26.0 - 34.0 pg   MCHC 31.7 30.0 - 36.0 g/dL   RDW 13.9 11.5 - 15.5 %   Platelets 272 150 - 400 K/uL   nRBC 0.0 0.0 - 0.2 %    Comment: Performed at [facility], [address], [place], [place] [address]  Basic metabolic panel     Status: Abnormal   Collection Time: 05/02/21  7:58 AM  Result Value Ref Range   Sodium 138 135 - 145 mmol/L   Potassium 4.0 3.5 - 5.1 mmol/L   Chloride 103 98 - 111 mmol/L   CO2 31 22 - 32 mmol/L   Glucose, Bld 151 (H) 70 - 99 mg/dL    Comment:  Glucose reference range applies only to samples taken after fasting for at least 8 hours.   BUN 19 8 - 23 mg/dL   Creatinine, Ser 0.45 0.44 - 1.00 mg/dL   Calcium 8.6 (L) 8.9 - 10.3 mg/dL   GFR, Estimated >60 >60 mL/min    Comment: (NOTE) Calculated using the CKD-EPI Creatinine Equation (2021)    Anion gap 4 (L) 5 - 15    Comment: Performed at [facility], [address] [place], [place] [address]  Glucose, capillary     Status: Abnormal   Collection Time: 05/02/21 11:15 AM  Result Value Ref Range   Glucose-Capillary 291 (H) 70 - 99 mg/dL    Comment: Glucose reference range applies only to samples taken after fasting for at least 8 hours.  Glucose, capillary     Status: Abnormal   Collection Time: 05/02/21  4:39 PM  Result Value Ref Range   Glucose-Capillary 294 (H) 70 - 99 mg/dL    Comment: Glucose reference range applies only to samples taken after fasting for at least 8 hours.  Glucose, capillary     Status: Abnormal   Collection Time: 05/02/21 10:44 PM  Result Value Ref Range   Glucose-Capillary 220 (H) 70 - 99 mg/dL    Comment: Glucose reference range applies only to samples taken after fasting for at least 8 hours.  CBC     Status: Abnormal   Collection Time: 05/03/21  4:17 AM  Result Value Ref Range   WBC 10.4 4.0 - 10.5 K/uL   RBC 3.19 (L) 3.87 -  5.11 MIL/uL   Hemoglobin 9.2 (L) 12.0 - 15.0 g/dL   HCT 29.6 (L) 36.0 - 46.0 %   MCV 92.8 80.0 - 100.0 fL   MCH 28.8 26.0 - 34.0 pg   MCHC 31.1 30.0 - 36.0 g/dL   RDW 13.8 11.5 - 15.5 %   Platelets 263 150 - 400 K/uL   nRBC 0.0 0.0 - 0.2 %    Comment: Performed at [facility] [place] [facility], [address], [place], [place] [address]  Basic metabolic panel     Status: Abnormal   Collection Time: 05/03/21  4:17 AM  Result Value Ref Range   Sodium 138 135 - 145 mmol/L   Potassium 4.1 3.5 - 5.1 mmol/L   Chloride 103 98 - 111 mmol/L   CO2 32 22 - 32 mmol/L   Glucose, Bld 154 (H) 70 - 99 mg/dL    Comment: Glucose  reference range applies only to samples taken after fasting for at least 8 hours.   BUN 23 8 - 23 mg/dL   Creatinine, Ser 0.50 0.44 - 1.00 mg/dL   Calcium 8.4 (L) 8.9 - 10.3 mg/dL   GFR, Estimated >60 >60 mL/min    Comment: (NOTE) Calculated using the CKD-EPI Creatinine Equation (2021)    Anion gap 3 (L) 5 - 15    Comment: Performed at [facility], [address] [place], [place] [address]  Glucose, capillary     Status: Abnormal   Collection Time: 05/03/21  8:02 AM  Result Value Ref Range   Glucose-Capillary 100 (H) 70 - 99 mg/dL    Comment: Glucose reference range applies only to samples taken after fasting for at least 8 hours.  Glucose, capillary     Status: Abnormal   Collection Time: 05/03/21 11:16 AM  Result Value Ref Range   Glucose-Capillary 235 (H) 70 - 99 mg/dL    Comment: Glucose reference range applies only to samples taken after fasting for at least 8 hours.  Glucose, capillary     Status: Abnormal   Collection Time: 05/03/21  4:26 PM  Result Value Ref Range   Glucose-Capillary 338 (H) 70 - 99 mg/dL    Comment: Glucose reference range applies only to samples taken after fasting for at least 8 hours.  Glucose, capillary     Status: Abnormal   Collection Time: 05/03/21  9:00 PM  Result Value Ref Range   Glucose-Capillary 237 (H) 70 - 99 mg/dL    Comment: Glucose reference range applies only to samples taken after fasting for at least 8 hours.  Glucose, capillary     Status: None   Collection Time: 05/04/21  7:47 AM  Result Value Ref Range   Glucose-Capillary 98 70 - 99 mg/dL    Comment: Glucose reference range applies only to samples taken after fasting for at least 8 hours.  Glucose, capillary     Status: Abnormal   Collection Time: 05/04/21 11:45 AM  Result Value Ref Range   Glucose-Capillary 253 (H) 70 - 99 mg/dL    Comment: Glucose reference range applies only to samples taken after fasting for at least 8 hours.  Glucose, capillary     Status:  Abnormal   Collection Time: 05/04/21  4:18 PM  Result Value Ref Range   Glucose-Capillary 339 (H) 70 - 99 mg/dL    Comment: Glucose reference range applies only to samples taken after fasting for at least 8 hours.  Glucose, capillary     Status: Abnormal   Collection  Time: 05/04/21  9:07 PM  Result Value Ref Range   Glucose-Capillary 293 (H) 70 - 99 mg/dL    Comment: Glucose reference range applies only to samples taken after fasting for at least 8 hours.  Glucose, capillary     Status: Abnormal   Collection Time: 05/05/21  7:57 AM  Result Value Ref Range   Glucose-Capillary 135 (H) 70 - 99 mg/dL    Comment: Glucose reference range applies only to samples taken after fasting for at least 8 hours.  Glucose, capillary     Status: Abnormal   Collection Time: 05/05/21 11:57 AM  Result Value Ref Range   Glucose-Capillary 290 (H) 70 - 99 mg/dL    Comment: Glucose reference range applies only to samples taken after fasting for at least 8 hours.  Glucose, capillary     Status: Abnormal   Collection Time: 05/05/21  4:26 PM  Result Value Ref Range   Glucose-Capillary 412 (H) 70 - 99 mg/dL    Comment: Glucose reference range applies only to samples taken after fasting for at least 8 hours.  Glucose, capillary     Status: Abnormal   Collection Time: 05/05/21  8:26 PM  Result Value Ref Range   Glucose-Capillary 248 (H) 70 - 99 mg/dL    Comment: Glucose reference range applies only to samples taken after fasting for at least 8 hours.  Glucose, capillary     Status: Abnormal   Collection Time: 05/06/21  7:31 AM  Result Value Ref Range   Glucose-Capillary 147 (H) 70 - 99 mg/dL    Comment: Glucose reference range applies only to samples taken after fasting for at least 8 hours.  Glucose, capillary     Status: Abnormal   Collection Time: 05/06/21  1:05 PM  Result Value Ref Range   Glucose-Capillary 253 (H) 70 - 99 mg/dL    Comment: Glucose reference range applies only to samples taken after  fasting for at least 8 hours.  Glucose, capillary     Status: Abnormal   Collection Time: 05/06/21  3:33 PM  Result Value Ref Range   Glucose-Capillary 188 (H) 70 - 99 mg/dL    Comment: Glucose reference range applies only to samples taken after fasting for at least 8 hours.  Glucose, capillary     Status: Abnormal   Collection Time: 05/06/21  8:52 PM  Result Value Ref Range   Glucose-Capillary 176 (H) 70 - 99 mg/dL    Comment: Glucose reference range applies only to samples taken after fasting for at least 8 hours.  CBC     Status: Abnormal   Collection Time: 05/07/21  3:33 AM  Result Value Ref Range   WBC 9.1 4.0 - 10.5 K/uL   RBC 3.05 (L) 3.87 - 5.11 MIL/uL   Hemoglobin 8.8 (L) 12.0 - 15.0 g/dL   HCT 27.6 (L) 36.0 - 46.0 %   MCV 90.5 80.0 - 100.0 fL   MCH 28.9 26.0 - 34.0 pg   MCHC 31.9 30.0 - 36.0 g/dL   RDW 13.9 11.5 - 15.5 %   Platelets 264 150 - 400 K/uL   nRBC 0.0 0.0 - 0.2 %    Comment: Performed at [facility], [address] [place], [place] [address]  Basic metabolic panel     Status: Abnormal   Collection Time: 05/07/21  3:33 AM  Result Value Ref Range   Sodium 136 135 - 145 mmol/L   Potassium 3.9 3.5 - 5.1 mmol/L   Chloride  100 98 - 111 mmol/L   CO2 33 (H) 22 - 32 mmol/L   Glucose, Bld 351 (H) 70 - 99 mg/dL    Comment: Glucose reference range applies only to samples taken after fasting for at least 8 hours.   BUN 24 (H) 8 - 23 mg/dL   Creatinine, Ser 0.49 0.44 - 1.00 mg/dL   Calcium 7.9 (L) 8.9 - 10.3 mg/dL   GFR, Estimated >60 >60 mL/min    Comment: (NOTE) Calculated using the CKD-EPI Creatinine Equation (2021)    Anion gap 3 (L) 5 - 15    Comment: Performed at [facility], [address] [place], [place] [address]  Glucose, capillary     Status: Abnormal   Collection Time: 05/07/21  7:33 AM  Result Value Ref Range   Glucose-Capillary 199 (H) 70 - 99 mg/dL    Comment: Glucose reference range applies only to samples taken after  fasting for at least 8 hours.  Glucose, capillary     Status: Abnormal   Collection Time: 05/07/21 10:26 AM  Result Value Ref Range   Glucose-Capillary 248 (H) 70 - 99 mg/dL    Comment: Glucose reference range applies only to samples taken after fasting for at least 8 hours.  Glucose, capillary     Status: Abnormal   Collection Time: 05/07/21  1:13 PM  Result Value Ref Range   Glucose-Capillary 234 (H) 70 - 99 mg/dL    Comment: Glucose reference range applies only to samples taken after fasting for at least 8 hours.  Glucose, capillary     Status: Abnormal   Collection Time: 05/07/21  2:57 PM  Result Value Ref Range   Glucose-Capillary 112 (H) 70 - 99 mg/dL    Comment: Glucose reference range applies only to samples taken after fasting for at least 8 hours.  Glucose, capillary     Status: Abnormal   Collection Time: 05/07/21  4:01 PM  Result Value Ref Range   Glucose-Capillary 116 (H) 70 - 99 mg/dL    Comment: Glucose reference range applies only to samples taken after fasting for at least 8 hours.  Glucose, capillary     Status: Abnormal   Collection Time: 05/07/21  9:05 PM  Result Value Ref Range   Glucose-Capillary 144 (H) 70 - 99 mg/dL    Comment: Glucose reference range applies only to samples taken after fasting for at least 8 hours.  CBC     Status: Abnormal   Collection Time: 05/08/21  4:19 AM  Result Value Ref Range   WBC 6.8 4.0 - 10.5 K/uL   RBC 3.02 (L) 3.87 - 5.11 MIL/uL   Hemoglobin 8.9 (L) 12.0 - 15.0 g/dL   HCT 28.3 (L) 36.0 - 46.0 %   MCV 93.7 80.0 - 100.0 fL   MCH 29.5 26.0 - 34.0 pg   MCHC 31.4 30.0 - 36.0 g/dL   RDW 14.0 11.5 - 15.5 %   Platelets 261 150 - 400 K/uL   nRBC 0.0 0.0 - 0.2 %    Comment: Performed at [facility], [address], [place], [place] [address]  Basic metabolic panel     Status: Abnormal   Collection Time: 05/08/21  4:19 AM  Result Value Ref Range   Sodium 140 135 - 145 mmol/L   Potassium 3.3 (L) 3.5 - 5.1 mmol/L    Chloride 100 98 - 111 mmol/L   CO2 34 (H) 22 - 32 mmol/L   Glucose, Bld 96 70 -  99 mg/dL    Comment: Glucose reference range applies only to samples taken after fasting for at least 8 hours.   BUN 19 8 - 23 mg/dL   Creatinine, Ser 0.48 0.44 - 1.00 mg/dL   Calcium 8.2 (L) 8.9 - 10.3 mg/dL   GFR, Estimated >60 >60 mL/min    Comment: (NOTE) Calculated using the CKD-EPI Creatinine Equation (2021)    Anion gap 6 5 - 15    Comment: Performed at [facility], [address] [place], [place] [address]  Glucose, capillary     Status: None   Collection Time: 05/08/21  7:40 AM  Result Value Ref Range   Glucose-Capillary 76 70 - 99 mg/dL    Comment: Glucose reference range applies only to samples taken after fasting for at least 8 hours.  Glucose, capillary     Status: Abnormal   Collection Time: 05/08/21 12:06 PM  Result Value Ref Range   Glucose-Capillary 357 (H) 70 - 99 mg/dL    Comment: Glucose reference range applies only to samples taken after fasting for at least 8 hours.  Basic metabolic panel     Status: Abnormal   Collection Time: 06/11/21  1:44 PM  Result Value Ref Range   Sodium 140 135 - 145 mmol/L   Potassium 4.4 3.5 - 5.1 mmol/L   Chloride 104 98 - 111 mmol/L   CO2 25 22 - 32 mmol/L   Glucose, Bld 187 (H) 70 - 99 mg/dL    Comment: Glucose reference range applies only to samples taken after fasting for at least 8 hours.   BUN 16 8 - 23 mg/dL   Creatinine, Ser 0.82 0.44 - 1.00 mg/dL   Calcium 8.6 (L) 8.9 - 10.3 mg/dL   GFR, Estimated >60 >60 mL/min    Comment: (NOTE) Calculated using the CKD-EPI Creatinine Equation (2021)    Anion gap 11 5 - 15    Comment: Performed at [facility], [address] [place], [place] [address]  CBC with Differential     Status: None   Collection Time: 06/11/21  1:44 PM  Result Value Ref Range   WBC 4.3 4.0 - 10.5 K/uL   RBC 4.29 3.87 - 5.11 MIL/uL   Hemoglobin 12.5 12.0 - 15.0 g/dL   HCT 41.2 36.0 - 46.0 %   MCV  96.0 80.0 - 100.0 fL   MCH 29.1 26.0 - 34.0 pg   MCHC 30.3 30.0 - 36.0 g/dL   RDW 13.8 11.5 - 15.5 %   Platelets 268 150 - 400 K/uL   nRBC 0.0 0.0 - 0.2 %   Neutrophils Relative % 41 %   Neutro Abs 1.8 1.7 - 7.7 K/uL   Lymphocytes Relative 41 %   Lymphs Abs 1.8 0.7 - 4.0 K/uL   Monocytes Relative 13 %   Monocytes Absolute 0.6 0.1 - 1.0 K/uL   Eosinophils Relative 4 %   Eosinophils Absolute 0.2 0.0 - 0.5 K/uL   Basophils Relative 1 %   Basophils Absolute 0.0 0.0 - 0.1 K/uL   Immature Granulocytes 0 %   Abs Immature Granulocytes 0.01 0.00 - 0.07 K/uL    Comment: Performed at [facility], [address] [place], [place] [address]  Resp Panel by RT-PCR (Flu A&B, Covid) Nasopharyngeal Swab     Status: None   Collection Time: 06/11/21  1:44 PM   Specimen: Nasopharyngeal Swab; Nasopharyngeal(NP) swabs in vial transport medium  Result Value Ref Range   SARS Coronavirus 2  by RT PCR NEGATIVE NEGATIVE    Comment: (NOTE) SARS-CoV-2 target nucleic acids are NOT DETECTED.  The SARS-CoV-2 RNA is generally detectable in upper respiratory specimens during the acute phase of infection. The lowest concentration of SARS-CoV-2 viral copies this assay can detect is 138 copies/mL. A negative result does not preclude SARS-Cov-2 infection and should not be used as the sole basis for treatment or other patient management decisions. A negative result may occur with  improper specimen collection/handling, submission of specimen other than nasopharyngeal swab, presence of viral mutation(s) within the areas targeted by this assay, and inadequate number of viral copies(<138 copies/mL). A negative result must be combined with clinical observations, patient history, and epidemiological information. The expected result is Negative.  Fact Sheet for Patients:  EntrepreneurPulse.com.au  Fact Sheet for Healthcare Providers:  IncredibleEmployment.be  This test is  no t yet approved or cleared by the [place] FDA and  has been authorized for detection and/or diagnosis of SARS-CoV-2 by FDA under an Emergency Use Authorization (EUA). This EUA will remain  in effect (meaning this test can be used) for the duration of the COVID-19 declaration under Section 564(b)(1) of the Act, 21 U.S.C.section 360bbb-3(b)(1), unless the authorization is terminated  or revoked sooner.       Influenza A by PCR NEGATIVE NEGATIVE   Influenza B by PCR NEGATIVE NEGATIVE    Comment: (NOTE) The Xpert Xpress SARS-CoV-2/FLU/RSV plus assay is intended as an aid in the diagnosis of influenza from Nasopharyngeal swab specimens and should not be used as a sole basis for treatment. Nasal washings and aspirates are unacceptable for Xpert Xpress SARS-CoV-2/FLU/RSV testing.  Fact Sheet for Patients: EntrepreneurPulse.com.au  Fact Sheet for Healthcare Providers: IncredibleEmployment.be  This test is not yet approved or cleared by the [place] FDA and has been authorized for detection and/or diagnosis of SARS-CoV-2 by FDA under an Emergency Use Authorization (EUA). This EUA will remain in effect (meaning this test can be used) for the duration of the COVID-19 declaration under Section 564(b)(1) of the Act, 21 U.S.C. section 360bbb-3(b)(1), unless the authorization is terminated or revoked.  Performed at [facility], [place], [place] [address]   Procalcitonin - Baseline     Status: None   Collection Time: 06/11/21  1:44 PM  Result Value Ref Range   Procalcitonin <0.10 ng/mL    Comment:        Interpretation: PCT (Procalcitonin) <= 0.5 ng/mL: Systemic infection (sepsis) is not likely. Local bacterial infection is possible. (NOTE)       Sepsis PCT Algorithm           Lower Respiratory Tract                                      Infection PCT Algorithm    ----------------------------      ----------------------------         PCT < 0.25 ng/mL                PCT < 0.10 ng/mL          Strongly encourage             Strongly discourage   discontinuation of antibiotics    initiation of antibiotics    ----------------------------     -----------------------------       PCT 0.25 - 0.50 ng/mL  PCT 0.10 - 0.25 ng/mL               OR       >80% decrease in PCT            Discourage initiation of                                            antibiotics      Encourage discontinuation           of antibiotics    ----------------------------     -----------------------------         PCT >= 0.50 ng/mL              PCT 0.26 - 0.50 ng/mL               AND        <80% decrease in PCT             Encourage initiation of                                             antibiotics       Encourage continuation           of antibiotics    ----------------------------     -----------------------------        PCT >= 0.50 ng/mL                  PCT > 0.50 ng/mL               AND         increase in PCT                  Strongly encourage                                      initiation of antibiotics    Strongly encourage escalation           of antibiotics                                     -----------------------------                                           PCT <= 0.25 ng/mL                                                 OR                                        > 80% decrease in PCT  Discontinue / Do not initiate                                             antibiotics  Performed at [facility], [address] [place], [place] [address]   Hemoglobin A1c     Status: Abnormal   Collection Time: 06/11/21  1:44 PM  Result Value Ref Range   Hgb A1c MFr Bld 6.6 (H) 4.8 - 5.6 %    Comment: (NOTE) Pre diabetes:          5.7%-6.4%  Diabetes:              >6.4%  Glycemic control for   <7.0% adults with diabetes    Mean Plasma  Glucose 142.72 mg/dL    Comment: Performed at [facility] [address] [place], [place] [address]  CBG monitoring, ED     Status: Abnormal   Collection Time: 06/11/21  6:05 PM  Result Value Ref Range   Glucose-Capillary 247 (H) 70 - 99 mg/dL    Comment: Glucose reference range applies only to samples taken after fasting for at least 8 hours.  CBG monitoring, ED     Status: Abnormal   Collection Time: 06/11/21  8:07 PM  Result Value Ref Range   Glucose-Capillary 305 (H) 70 - 99 mg/dL    Comment: Glucose reference range applies only to samples taken after fasting for at least 8 hours.  CBG monitoring, ED     Status: Abnormal   Collection Time: 06/11/21  9:01 PM  Result Value Ref Range   Glucose-Capillary 290 (H) 70 - 99 mg/dL    Comment: Glucose reference range applies only to samples taken after fasting for at least 8 hours.  Basic metabolic panel     Status: Abnormal   Collection Time: 06/12/21  5:29 AM  Result Value Ref Range   Sodium 136 135 - 145 mmol/L   Potassium 3.8 3.5 - 5.1 mmol/L   Chloride 101 98 - 111 mmol/L   CO2 26 22 - 32 mmol/L   Glucose, Bld 275 (H) 70 - 99 mg/dL    Comment: Glucose reference range applies only to samples taken after fasting for at least 8 hours.   BUN 21 8 - 23 mg/dL   Creatinine, Ser 0.70 0.44 - 1.00 mg/dL   Calcium 8.5 (L) 8.9 - 10.3 mg/dL   GFR, Estimated >60 >60 mL/min    Comment: (NOTE) Calculated using the CKD-EPI Creatinine Equation (2021)    Anion gap 9 5 - 15    Comment: Performed at [facility], [address] [place], [place] [address]  CBC WITH DIFFERENTIAL     Status: Abnormal   Collection Time: 06/12/21  5:29 AM  Result Value Ref Range   WBC 7.4 4.0 - 10.5 K/uL   RBC 3.47 (L) 3.87 - 5.11 MIL/uL   Hemoglobin 10.3 (L) 12.0 - 15.0 g/dL   HCT 32.8 (L) 36.0 - 46.0 %   MCV 94.5 80.0 - 100.0 fL   MCH 29.7 26.0 - 34.0 pg   MCHC 31.4 30.0 - 36.0 g/dL   RDW 13.8 11.5 - 15.5 %   Platelets 211 150 - 400 K/uL    nRBC 0.0 0.0 - 0.2 %   Neutrophils Relative % 80 %   Neutro Abs 5.9 1.7 - 7.7 K/uL   Lymphocytes  Relative 10 %   Lymphs Abs 0.8 0.7 - 4.0 K/uL   Monocytes Relative 10 %   Monocytes Absolute 0.7 0.1 - 1.0 K/uL   Eosinophils Relative 0 %   Eosinophils Absolute 0.0 0.0 - 0.5 K/uL   Basophils Relative 0 %   Basophils Absolute 0.0 0.0 - 0.1 K/uL   Immature Granulocytes 0 %   Abs Immature Granulocytes 0.02 0.00 - 0.07 K/uL    Comment: Performed at [facility], [address] [place], [place] [address]  CBG monitoring, ED     Status: Abnormal   Collection Time: 06/12/21  8:39 AM  Result Value Ref Range   Glucose-Capillary 179 (H) 70 - 99 mg/dL    Comment: Glucose reference range applies only to samples taken after fasting for at least 8 hours.  CBG monitoring, ED     Status: Abnormal   Collection Time: 06/12/21 12:18 PM  Result Value Ref Range   Glucose-Capillary 118 (H) 70 - 99 mg/dL    Comment: Glucose reference range applies only to samples taken after fasting for at least 8 hours.  CBG monitoring, ED     Status: Abnormal   Collection Time: 06/12/21  4:41 PM  Result Value Ref Range   Glucose-Capillary 233 (H) 70 - 99 mg/dL    Comment: Glucose reference range applies only to samples taken after fasting for at least 8 hours.  Glucose, capillary     Status: Abnormal   Collection Time: 06/12/21  9:03 PM  Result Value Ref Range   Glucose-Capillary 152 (H) 70 - 99 mg/dL    Comment: Glucose reference range applies only to samples taken after fasting for at least 8 hours.  HIV Antibody (routine testing w rflx)     Status: None   Collection Time: 06/13/21  6:35 AM  Result Value Ref Range   HIV Screen 4th Generation wRfx Non Reactive Non Reactive    Comment: Performed at [facility], [address], [place], [place] [address]  Glucose, capillary     Status: Abnormal   Collection Time: 06/13/21  7:28 AM  Result Value Ref Range   Glucose-Capillary 69 (L) 70 - 99 mg/dL     Comment: Glucose reference range applies only to samples taken after fasting for at least 8 hours.  MRSA Next Gen by PCR, Nasal     Status: None   Collection Time: 06/13/21  8:00 AM   Specimen: Nasal Mucosa; Nasal Swab  Result Value Ref Range   MRSA by PCR Next Gen NOT DETECTED NOT DETECTED    Comment: (NOTE) The GeneXpert MRSA Assay (FDA approved for NASAL specimens only), is one component of a comprehensive MRSA colonization surveillance program. It is not intended to diagnose MRSA infection nor to guide or monitor treatment for MRSA infections. Test performance is not FDA approved in patients less than 74 years old. Performed at [facility], [address] [place], [place] [address]   Glucose, capillary     Status: Abnormal   Collection Time: 06/13/21  8:39 AM  Result Value Ref Range   Glucose-Capillary 111 (H) 70 - 99 mg/dL    Comment: Glucose reference range applies only to samples taken after fasting for at least 8 hours.  Glucose, capillary     Status: Abnormal   Collection Time: 06/13/21 11:32 AM  Result Value Ref Range   Glucose-Capillary 149 (H) 70 - 99 mg/dL    Comment: Glucose reference range applies only to samples taken  after fasting for at least 8 hours.  Glucose, capillary     Status: Abnormal   Collection Time: 06/13/21  3:54 PM  Result Value Ref Range   Glucose-Capillary 258 (H) 70 - 99 mg/dL    Comment: Glucose reference range applies only to samples taken after fasting for at least 8 hours.  Glucose, capillary     Status: Abnormal   Collection Time: 06/13/21  8:27 PM  Result Value Ref Range   Glucose-Capillary 201 (H) 70 - 99 mg/dL    Comment: Glucose reference range applies only to samples taken after fasting for at least 8 hours.  Glucose, capillary     Status: Abnormal   Collection Time: 06/14/21  7:38 AM  Result Value Ref Range   Glucose-Capillary 153 (H) 70 - 99 mg/dL    Comment: Glucose reference range applies only to samples taken after  fasting for at least 8 hours.  Glucose, capillary     Status: None   Collection Time: 06/14/21 11:53 AM  Result Value Ref Range   Glucose-Capillary 84 70 - 99 mg/dL    Comment: Glucose reference range applies only to samples taken after fasting for at least 8 hours.  Resp Panel by RT-PCR (Flu A&B, Covid) Nasopharyngeal Swab     Status: None   Collection Time: 06/26/21 11:41 AM   Specimen: Nasopharyngeal Swab; Nasopharyngeal(NP) swabs in vial transport medium  Result Value Ref Range   SARS Coronavirus 2 by RT PCR NEGATIVE NEGATIVE    Comment: (NOTE) SARS-CoV-2 target nucleic acids are NOT DETECTED.  The SARS-CoV-2 RNA is generally detectable in upper respiratory specimens during the acute phase of infection. The lowest concentration of SARS-CoV-2 viral copies this assay can detect is 138 copies/mL. A negative result does not preclude SARS-Cov-2 infection and should not be used as the sole basis for treatment or other patient management decisions. A negative result may occur with  improper specimen collection/handling, submission of specimen other than nasopharyngeal swab, presence of viral mutation(s) within the areas targeted by this assay, and inadequate number of viral copies(<138 copies/mL). A negative result must be combined with clinical observations, patient history, and epidemiological information. The expected result is Negative.  Fact Sheet for Patients:  EntrepreneurPulse.com.au  Fact Sheet for Healthcare Providers:  IncredibleEmployment.be  This test is no t yet approved or cleared by the [place] FDA and  has been authorized for detection and/or diagnosis of SARS-CoV-2 by FDA under an Emergency Use Authorization (EUA). This EUA will remain  in effect (meaning this test can be used) for the duration of the COVID-19 declaration under Section 564(b)(1) of the Act, 21 U.S.C.section 360bbb-3(b)(1), unless the authorization is  terminated  or revoked sooner.       Influenza A by PCR NEGATIVE NEGATIVE   Influenza B by PCR NEGATIVE NEGATIVE    Comment: (NOTE) The Xpert Xpress SARS-CoV-2/FLU/RSV plus assay is intended as an aid in the diagnosis of influenza from Nasopharyngeal swab specimens and should not be used as a sole basis for treatment. Nasal washings and aspirates are unacceptable for Xpert Xpress SARS-CoV-2/FLU/RSV testing.  Fact Sheet for Patients: EntrepreneurPulse.com.au  Fact Sheet for Healthcare Providers: IncredibleEmployment.be  This test is not yet approved or cleared by the [place] FDA and has been authorized for detection and/or diagnosis of SARS-CoV-2 by FDA under an Emergency Use Authorization (EUA). This EUA will remain in effect (meaning this test can be used) for the duration of the COVID-19 declaration under Section 564(b)(1) of the  Act, 21 U.S.C. section 360bbb-3(b)(1), unless the authorization is terminated or revoked.  Performed at [facility], [address] [place], [place] [address]   Comprehensive metabolic panel     Status: Abnormal   Collection Time: 06/26/21 11:41 AM  Result Value Ref Range   Sodium 137 135 - 145 mmol/L   Potassium 3.8 3.5 - 5.1 mmol/L   Chloride 102 98 - 111 mmol/L   CO2 27 22 - 32 mmol/L   Glucose, Bld 178 (H) 70 - 99 mg/dL    Comment: Glucose reference range applies only to samples taken after fasting for at least 8 hours.   BUN 23 8 - 23 mg/dL   Creatinine, Ser 0.62 0.44 - 1.00 mg/dL   Calcium 9.0 8.9 - 10.3 mg/dL   Total Protein 6.9 6.5 - 8.1 g/dL   Albumin 3.8 3.5 - 5.0 g/dL   AST 40 15 - 41 U/L   ALT 38 0 - 44 U/L   Alkaline Phosphatase 70 38 - 126 U/L   Total Bilirubin 0.6 0.3 - 1.2 mg/dL   GFR, Estimated >60 >60 mL/min    Comment: (NOTE) Calculated using the CKD-EPI Creatinine Equation (2021)    Anion gap 8 5 - 15    Comment: Performed at [facility] [place] [facility], [address] [place], [place] [address]  CBC with Differential     Status: Abnormal   Collection Time: 06/26/21 11:41 AM  Result Value Ref Range   WBC 5.3 4.0 - 10.5 K/uL   RBC 3.69 (L) 3.87 - 5.11 MIL/uL   Hemoglobin 10.8 (L) 12.0 - 15.0 g/dL   HCT 35.3 (L) 36.0 - 46.0 %   MCV 95.7 80.0 - 100.0 fL   MCH 29.3 26.0 - 34.0 pg   MCHC 30.6 30.0 - 36.0 g/dL   RDW 13.2 11.5 - 15.5 %   Platelets 207 150 - 400 K/uL   nRBC 0.0 0.0 - 0.2 %   Neutrophils Relative % 71 %   Neutro Abs 3.7 1.7 - 7.7 K/uL   Lymphocytes Relative 15 %   Lymphs Abs 0.8 0.7 - 4.0 K/uL   Monocytes Relative 11 %   Monocytes Absolute 0.6 0.1 - 1.0 K/uL   Eosinophils Relative 3 %   Eosinophils Absolute 0.2 0.0 - 0.5 K/uL   Basophils Relative 0 %   Basophils Absolute 0.0 0.0 - 0.1 K/uL   Immature Granulocytes 0 %   Abs Immature Granulocytes 0.02 0.00 - 0.07 K/uL    Comment: Performed at [facility], [address] [place], [place] [address]  Urinalysis, Routine w reflex microscopic Urine, Clean Catch     Status: Abnormal   Collection Time: 06/26/21  2:01 PM  Result Value Ref Range   Color, Urine YELLOW (A) YELLOW   APPearance CLEAR (A) CLEAR   Specific Gravity, Urine 1.013 1.005 - 1.030   pH 5.0 5.0 - 8.0   Glucose, UA NEGATIVE NEGATIVE mg/dL   Hgb urine dipstick SMALL (A) NEGATIVE   Bilirubin Urine NEGATIVE NEGATIVE   Ketones, ur NEGATIVE NEGATIVE mg/dL   Protein, ur 100 (A) NEGATIVE mg/dL   Nitrite NEGATIVE NEGATIVE   Leukocytes,Ua NEGATIVE NEGATIVE   RBC / HPF 0-5 0 - 5 RBC/hpf   WBC, UA 0-5 0 - 5 WBC/hpf   Bacteria, UA NONE SEEN NONE SEEN   Squamous Epithelial / LPF 0-5 0 - 5   Mucus PRESENT     Comment: Performed at [facility], [address] [place], [place] [address]  Blood culture (routine x 2)     Status: None   Collection Time: 06/26/21  2:01 PM   Specimen: BLOOD  Result Value Ref Range   Specimen Description BLOOD RIGHT ANTECUBITAL    Special Requests      BOTTLES DRAWN AEROBIC  AND ANAEROBIC Blood Culture adequate volume   Culture      NO GROWTH 5 DAYS Performed at [facility], [address], [place], [place] [address]    Report Status 07/01/2021 FINAL   Blood culture (routine x 2)     Status: None   Collection Time: 06/26/21  2:01 PM   Specimen: BLOOD  Result Value Ref Range   Specimen Description BLOOD LEFT ANTECUBITAL    Special Requests      BOTTLES DRAWN AEROBIC AND ANAEROBIC Blood Culture adequate volume   Culture      NO GROWTH 5 DAYS Performed at [facility], [address] [place], [place] [address]    Report Status 07/01/2021 FINAL   Blood gas, venous     Status: Abnormal   Collection Time: 06/26/21  3:13 PM  Result Value Ref Range   pH, Ven 7.35 7.250 - 7.430   pCO2, Ven 52 44.0 - 60.0 mmHg   pO2, Ven 59.0 (H) 32.0 - 45.0 mmHg   Bicarbonate 28.7 (H) 20.0 - 28.0 mmol/L   Acid-Base Excess 2.3 (H) 0.0 - 2.0 mmol/L   O2 Saturation 88.7 %   Patient temperature 37.0    Collection site VEIN    Sample type VEIN     Comment: Performed at [facility], [address] [place], [place] [address]  Glucose, capillary     Status: Abnormal   Collection Time: 06/26/21  5:46 PM  Result Value Ref Range   Glucose-Capillary 384 (H) 70 - 99 mg/dL    Comment: Glucose reference range applies only to samples taken after fasting for at least 8 hours.  Glucose, capillary     Status: Abnormal   Collection Time: 06/26/21  5:50 PM  Result Value Ref Range   Glucose-Capillary 343 (H) 70 - 99 mg/dL    Comment: Glucose reference range applies only to samples taken after fasting for at least 8 hours.  Glucose, capillary     Status: Abnormal   Collection Time: 06/26/21  8:45 PM  Result Value Ref Range   Glucose-Capillary 229 (H) 70 - 99 mg/dL    Comment: Glucose reference range applies only to samples taken after fasting for at least 8 hours.   Comment 1 Notify RN   Basic metabolic panel     Status: Abnormal   Collection Time:  06/27/21  5:53 AM  Result Value Ref Range   Sodium 135 135 - 145 mmol/L   Potassium 4.1 3.5 - 5.1 mmol/L   Chloride 100 98 - 111 mmol/L   CO2 28 22 - 32 mmol/L   Glucose, Bld 329 (H) 70 - 99 mg/dL    Comment: Glucose reference range applies only to samples taken after fasting for at least 8 hours.   BUN 22 8 - 23 mg/dL   Creatinine, Ser 0.66 0.44 - 1.00 mg/dL   Calcium 8.9 8.9 - 10.3 mg/dL   GFR, Estimated >60 >60 mL/min    Comment: (NOTE) Calculated using the CKD-EPI Creatinine Equation (2021)    Anion gap 7 5 - 15    Comment: Performed at [facility], [address] [place], [place] [address]  CBC     Status: Abnormal   Collection Time: 06/27/21  5:53 AM  Result Value Ref Range   WBC 11.6 (H) 4.0 - 10.5 K/uL   RBC 3.61 (L) 3.87 - 5.11 MIL/uL   Hemoglobin 10.3 (L) 12.0 - 15.0 g/dL   HCT 33.5 (L) 36.0 - 46.0 %   MCV 92.8 80.0 - 100.0 fL   MCH 28.5 26.0 - 34.0 pg   MCHC 30.7 30.0 - 36.0 g/dL   RDW 13.2 11.5 - 15.5 %   Platelets 233 150 - 400 K/uL   nRBC 0.0 0.0 - 0.2 %    Comment: Performed at [facility], [address] [place], [place] [address]  Glucose, capillary     Status: Abnormal   Collection Time: 06/27/21  7:30 AM  Result Value Ref Range   Glucose-Capillary 278 (H) 70 - 99 mg/dL    Comment: Glucose reference range applies only to samples taken after fasting for at least 8 hours.  Glucose, capillary     Status: Abnormal   Collection Time: 06/27/21 11:41 AM  Result Value Ref Range   Glucose-Capillary 229 (H) 70 - 99 mg/dL    Comment: Glucose reference range applies only to samples taken after fasting for at least 8 hours.  Glucose, capillary     Status: Abnormal   Collection Time: 06/27/21  4:12 PM  Result Value Ref Range   Glucose-Capillary 201 (H) 70 - 99 mg/dL    Comment: Glucose reference range applies only to samples taken after fasting for at least 8 hours.  Glucose, capillary     Status: Abnormal   Collection Time: 06/27/21  9:10 PM   Result Value Ref Range   Glucose-Capillary 344 (H) 70 - 99 mg/dL    Comment: Glucose reference range applies only to samples taken after fasting for at least 8 hours.  Procalcitonin     Status: None   Collection Time: 06/28/21  3:55 AM  Result Value Ref Range   Procalcitonin <0.10 ng/mL    Comment:        Interpretation: PCT (Procalcitonin) <= 0.5 ng/mL: Systemic infection (sepsis) is not likely. Local bacterial infection is possible. (NOTE)       Sepsis PCT Algorithm           Lower Respiratory Tract                                      Infection PCT Algorithm    ----------------------------     ----------------------------         PCT < 0.25 ng/mL                PCT < 0.10 ng/mL          Strongly encourage             Strongly discourage   discontinuation of antibiotics    initiation of antibiotics    ----------------------------     -----------------------------       PCT 0.25 - 0.50 ng/mL            PCT 0.10 - 0.25 ng/mL               OR       >80% decrease in PCT            Discourage initiation of  antibiotics      Encourage discontinuation           of antibiotics    ----------------------------     -----------------------------         PCT >= 0.50 ng/mL              PCT 0.26 - 0.50 ng/mL               AND        <80% decrease in PCT             Encourage initiation of                                             antibiotics       Encourage continuation           of antibiotics    ----------------------------     -----------------------------        PCT >= 0.50 ng/mL                  PCT > 0.50 ng/mL               AND         increase in PCT                  Strongly encourage                                      initiation of antibiotics    Strongly encourage escalation           of antibiotics                                     -----------------------------                                           PCT <= 0.25 ng/mL                                                  OR                                        > 80% decrease in PCT                                      Discontinue / Do not initiate                                             antibiotics  Performed at [facility], [address], [place], [place] [address]   CBC     Status: Abnormal   Collection Time: 06/28/21  3:55 AM  Result Value Ref Range   WBC 10.1 4.0 - 10.5 K/uL   RBC 3.37 (L) 3.87 - 5.11 MIL/uL   Hemoglobin 9.8 (L) 12.0 - 15.0 g/dL   HCT 32.1 (L) 36.0 - 46.0 %   MCV 95.3 80.0 - 100.0 fL   MCH 29.1 26.0 - 34.0 pg   MCHC 30.5 30.0 - 36.0 g/dL   RDW 13.7 11.5 - 15.5 %   Platelets 215 150 - 400 K/uL   nRBC 0.0 0.0 - 0.2 %    Comment: Performed at [facility], [address] [place], [place] [address]  Glucose, capillary     Status: Abnormal   Collection Time: 06/28/21  7:40 AM  Result Value Ref Range   Glucose-Capillary 252 (H) 70 - 99 mg/dL    Comment: Glucose reference range applies only to samples taken after fasting for at least 8 hours.  Glucose, capillary     Status: Abnormal   Collection Time: 06/28/21 11:30 AM  Result Value Ref Range   Glucose-Capillary 165 (H) 70 - 99 mg/dL    Comment: Glucose reference range applies only to samples taken after fasting for at least 8 hours.  Glucose, capillary     Status: Abnormal   Collection Time: 06/28/21  4:35 PM  Result Value Ref Range   Glucose-Capillary 248 (H) 70 - 99 mg/dL    Comment: Glucose reference range applies only to samples taken after fasting for at least 8 hours.  Glucose, capillary     Status: Abnormal   Collection Time: 06/28/21  8:40 PM  Result Value Ref Range   Glucose-Capillary 140 (H) 70 - 99 mg/dL    Comment: Glucose reference range applies only to samples taken after fasting for at least 8 hours.   Comment 1 Notify RN    Comment 2 Document in Chart   Glucose, capillary     Status: Abnormal   Collection Time: 06/29/21  7:48 AM  Result  Value Ref Range   Glucose-Capillary 286 (H) 70 - 99 mg/dL    Comment: Glucose reference range applies only to samples taken after fasting for at least 8 hours.    Radiology: No results found.  No results found.  DG Chest 2 View  Result Date: 06/26/2021 CLINICAL DATA:  Shortness of breath EXAM: CHEST - 2 VIEW COMPARISON:  06/11/2021 FINDINGS: Background emphysema. Persistent but improved opacities at the right greater than left lung bases. No significant pleural effusion. No pneumothorax. Stable cardiomediastinal contours with normal heart size. IMPRESSION: Persistent but improved pneumonia at the right greater than left lung bases. Electronically Signed   By: Macy Mis M.D.   On: 06/26/2021 13:24   CT Angio Chest Pulmonary Embolism (PE) W or WO Contrast  Result Date: 06/26/2021 CLINICAL DATA:  Difficulty breathing, COPD EXAM: CT ANGIOGRAPHY CHEST WITH CONTRAST TECHNIQUE: Multidetector CT imaging of the chest was performed using the standard protocol during bolus administration of intravenous contrast. Multiplanar CT image reconstructions and MIPs were obtained to evaluate the vascular anatomy. CONTRAST:  73mL OMNIPAQUE IOHEXOL 350 MG/ML SOLN COMPARISON:  05/01/2021 FINDINGS: Cardiovascular: There is homogeneous enhancement in thoracic aorta. There are no intraluminal filling defects in the central pulmonary artery branches. There is slightly inhomogeneous attenuation in the small peripheral branches in the both lower lobes. In the image 59 of series 4, there is questionable exophytic low-density in the anterior margin of 1 of the segmental branches in the left lower lobe which could not be distinctly seen  in the adjacent images suggesting possible artifact. Evaluation is limited by motion artifacts in both lungs and infiltrates/scarring in the right lung. Mediastinum/Nodes: No new significant lymphadenopathy seen Lungs/Pleura: Marked bronchospasm seen in the previous study is not evident in  the current study. Centrilobular and panlobular emphysema is seen in both lungs. There is small patchy infiltrate in lateral aspect of right apex with no significant change. There is focal alveolar density in the posterior segment of right upper lobe with no significant change. There are patchy infiltrates in the right lower lobe with no significant interval change. Increased interstitial markings are seen in the right lung with no significant change suggesting scarring. There are no new focal pulmonary infiltrates. There is no significant pleural effusion or pneumothorax. Upper Abdomen: There is fatty infiltration in the liver Musculoskeletal: Unremarkable Review of the MIP images confirms the above findings. IMPRESSION: There is no evidence central pulmonary artery embolism. Evaluation of small peripheral pulmonary artery branches is limited by motion artifacts and infiltrates, especially in the lower lobes. There is no evidence of right ventricular strain. COPD. There are patchy infiltrates and fibrotic changes in the right lung with no significant interval change. There is no pleural effusion or pneumothorax. Electronically Signed   By: Elmer Picker M.D.   On: 06/26/2021 16:51   DG Chest Portable 1 View  Result Date: 06/11/2021 CLINICAL DATA:  Shortness of breath, wheezing. EXAM: PORTABLE CHEST 1 VIEW COMPARISON:  Chest radiograph 05/06/2021; CT chest 05/01/2021 FINDINGS: Upper lobe predominant emphysematous changes with reticulation at the lung bases. Interstitial and patchy airspace opacities in the mid and lower lungs, right greater than left. No pleural effusion or pneumothorax. Scarring at the right apex. Heart is normal in size. No acute osseous abnormality. IMPRESSION: Interstitial and patchy airspace opacities in the mid and lower lungs, right-greater-than-left. Findings are concerning for multifocal pneumonia in the appropriate clinical context. Emphysema (ICD10-J43.9). Electronically Signed    By: Ileana Roup M.D.   On: 06/11/2021 14:20      Assessment and Plan: Patient Active Problem List   Diagnosis Date Noted   Chronic respiratory failure with hypoxia ([place])    CAP (community acquired pneumonia) 06/26/2021   COPD exacerbation ([place]) 06/11/2021   SOB (shortness of breath) 05/01/2021   HCAP (healthcare-associated pneumonia) 05/01/2021   Chronic combined systolic and diastolic CHF (congestive heart failure) ([place]) 05/01/2021   Acute exacerbation of chronic obstructive pulmonary disease (COPD) ([place]) 04/30/2021   Elevated troponin 04/01/2021   Acute on chronic respiratory failure with hypoxia ([place]) 03/31/2021   Pneumothorax after biopsy 05/03/2020   Pneumothorax on right 05/01/2020   Chest pain on breathing 04/21/2020   Pleuritic chest pain 04/11/2020   Right lower lobe pulmonary nodule 04/02/2020   Iron deficiency anemia 04/02/2020   Pica in adults 04/02/2020   Goals of care, counseling/discussion 03/14/2020   Pain in left wrist 09/24/2019   Encounter for general adult medical examination with abnormal findings 06/28/2019   Elevated total protein 01/22/2019   Uncontrolled type 2 diabetes mellitus with hyperglycemia ([place]) 05/16/2018   Chronic obstructive pulmonary disease ([place]) 05/16/2018   Vitamin D deficiency 05/16/2018   Flu vaccine need 05/16/2018   Dysuria 05/16/2018   Cancer of lower lobe of right lung ([place]) 01/21/2018   Screening for breast cancer 10/12/2017   Personal history of tobacco use, presenting hazards to health 02/03/2017   PAD (peripheral artery disease) ([place]) 06/22/2016   Essential hypertension 05/26/2016   Diabetes ([place]) 05/26/2016   Hyperlipidemia 05/26/2016  Atherosclerosis of native arteries of extremity with intermittent claudication ([place]) 05/26/2016   Uterine leiomyoma 04/30/2016   Elevated CEA 01/31/2016   Special screening for malignant neoplasms, colon    Benign neoplasm of sigmoid colon    Primary lung cancer ([place])    Malignant  neoplasm of upper lobe of right lung ([place])    1. Chronic obstructive pulmonary disease, unspecified COPD type ([place]) Doing better on inhalers. She states her oxygen levels do drop if she overexerts. So she has to pace herself for her breathing to stay stable.   2. Lobar pneumonia, unspecified organism ([place]) Treated for pneumonia now better follow up CXR suggested  3. Malignant neoplasm of lower lobe of right lung [facility]) Follow up oncology and Radiation oncology    General Counseling: I have discussed the findings of the evaluation and examination with [facility].  I have also discussed any further diagnostic evaluation thatmay be needed or ordered today. Saryn verbalizes understanding of the findings of todays visit. We also reviewed her medications today and discussed drug interactions and side effects including but not limited excessive drowsiness and altered mental states. We also discussed that there is always a risk not just to her but also people around her. she has been encouraged to call the office with any questions or concerns that should arise related to todays visit.  No orders of the defined types were placed in this encounter.    Time spent: 65  I have personally obtained a history, examined the patient, evaluated laboratory and imaging results, formulated the assessment and plan and placed orders.    Allyne Gee, MD [facility] Pulmonary and Critical Care Sleep medicine

## 2021-07-03 NOTE — Patient Instructions (Signed)

## 2021-07-08 ENCOUNTER — Inpatient Hospital Stay: Payer: Medicare Other | Admitting: Nurse Practitioner

## 2021-07-08 ENCOUNTER — Observation Stay (HOSPITAL_BASED_OUTPATIENT_CLINIC_OR_DEPARTMENT_OTHER)
Admission: EM | Admit: 2021-07-08 | Discharge: 2021-07-10 | Disposition: A | Discharge status: Home / Self Care | Payer: Medicare Other | Source: Home / Self Care | Attending: Student in an Organized Health Care Education/Training Program | Admitting: Student in an Organized Health Care Education/Training Program

## 2021-07-08 DIAGNOSIS — R42 Dizziness and giddiness: Secondary | ICD-10-CM

## 2021-07-08 DIAGNOSIS — Z20822 Contact with and (suspected) exposure to covid-19: Secondary | ICD-10-CM | POA: Insufficient documentation

## 2021-07-08 DIAGNOSIS — R55 Syncope and collapse: Secondary | ICD-10-CM | POA: Insufficient documentation

## 2021-07-08 DIAGNOSIS — Z79899 Other long term (current) drug therapy: Secondary | ICD-10-CM | POA: Insufficient documentation

## 2021-07-08 DIAGNOSIS — I10 Essential (primary) hypertension: Secondary | ICD-10-CM | POA: Insufficient documentation

## 2021-07-08 DIAGNOSIS — E119 Type 2 diabetes mellitus without complications: Secondary | ICD-10-CM | POA: Insufficient documentation

## 2021-07-08 DIAGNOSIS — J45909 Unspecified asthma, uncomplicated: Secondary | ICD-10-CM | POA: Insufficient documentation

## 2021-07-08 DIAGNOSIS — Z87891 Personal history of nicotine dependence: Secondary | ICD-10-CM | POA: Insufficient documentation

## 2021-07-08 DIAGNOSIS — J449 Chronic obstructive pulmonary disease, unspecified: Secondary | ICD-10-CM | POA: Insufficient documentation

## 2021-07-08 DIAGNOSIS — I442 Atrioventricular block, complete: Secondary | ICD-10-CM | POA: Diagnosis not present

## 2021-07-08 DIAGNOSIS — R001 Bradycardia, unspecified: Secondary | ICD-10-CM | POA: Insufficient documentation

## 2021-07-08 LAB — CBC WITH DIFFERENTIAL/PLATELET
Abs Immature Granulocytes: 0.02 10*3/uL (ref 0.00–0.07)
Basophils Absolute: 0 10*3/uL (ref 0.0–0.1)
Basophils Relative: 0 %
Eosinophils Absolute: 0.1 10*3/uL (ref 0.0–0.5)
Eosinophils Relative: 2 %
HCT: 33.6 % — ABNORMAL LOW (ref 36.0–46.0)
Hemoglobin: 10.5 g/dL — ABNORMAL LOW (ref 12.0–15.0)
Immature Granulocytes: 0 %
Lymphocytes Relative: 17 %
Lymphs Abs: 0.8 10*3/uL (ref 0.7–4.0)
MCH: 29.7 pg (ref 26.0–34.0)
MCHC: 31.3 g/dL (ref 30.0–36.0)
MCV: 94.9 fL (ref 80.0–100.0)
Monocytes Absolute: 0.6 10*3/uL (ref 0.1–1.0)
Monocytes Relative: 12 %
Neutro Abs: 3.5 10*3/uL (ref 1.7–7.7)
Neutrophils Relative %: 69 %
Platelets: 236 10*3/uL (ref 150–400)
RBC: 3.54 MIL/uL — ABNORMAL LOW (ref 3.87–5.11)
RDW: 14.1 % (ref 11.5–15.5)
WBC: 5.1 10*3/uL (ref 4.0–10.5)
nRBC: 0 % (ref 0.0–0.2)

## 2021-07-08 LAB — COMPREHENSIVE METABOLIC PANEL
ALT: 134 U/L — ABNORMAL HIGH (ref 0–44)
AST: 150 U/L — ABNORMAL HIGH (ref 15–41)
Albumin: 3.7 g/dL (ref 3.5–5.0)
Alkaline Phosphatase: 80 U/L (ref 38–126)
Anion gap: 6 (ref 5–15)
BUN: 23 mg/dL (ref 8–23)
CO2: 27 mmol/L (ref 22–32)
Calcium: 8.7 mg/dL — ABNORMAL LOW (ref 8.9–10.3)
Chloride: 102 mmol/L (ref 98–111)
Creatinine, Ser: 0.52 mg/dL (ref 0.44–1.00)
GFR, Estimated: >60 mL/min (ref >60)
Glucose, Bld: 163 mg/dL — ABNORMAL HIGH (ref 70–99)
Potassium: 3.7 mmol/L (ref 3.5–5.1)
Sodium: 135 mmol/L (ref 135–145)
Total Bilirubin: 0.6 mg/dL (ref 0.3–1.2)
Total Protein: 6.8 g/dL (ref 6.5–8.1)

## 2021-07-08 LAB — URINALYSIS, ROUTINE W REFLEX MICROSCOPIC
Bilirubin Urine: NEGATIVE
Glucose, UA: NEGATIVE mg/dL
Hgb urine dipstick: NEGATIVE
Ketones, ur: NEGATIVE mg/dL
Leukocytes,Ua: NEGATIVE
Nitrite: NEGATIVE
Protein, ur: NEGATIVE mg/dL
Specific Gravity, Urine: 1.008 (ref 1.005–1.030)
pH: 6 (ref 5.0–8.0)

## 2021-07-08 LAB — TROPONIN I (HIGH SENSITIVITY): Troponin I (High Sensitivity): 18 ng/L — ABNORMAL HIGH (ref <18)

## 2021-07-08 MED ORDER — SODIUM CHLORIDE 0.9 % IV BOLUS
500.0000 mL | Freq: Once | INTRAVENOUS | Status: AC
  Administered 2021-07-09: 500 mL via INTRAVENOUS

## 2021-07-08 NOTE — ED Provider Notes (Signed)
[facility] Emergency Department Provider Note  ____________________________________________   I have reviewed the triage vital signs and the nursing notes.   HISTORY  Chief Complaint Dizziness   History limited by: Not Limited   HPI Angela Jensen is a 65 y.o. female who presents to the emergency department today because of concerns for dizziness and bradycardia.  Patient states she was sleeping when all of a sudden she felt like she was having a hard time catching her breath.  She did think that it might be due to low oxygen level so put her pulse ox on.  While her oxygen level was found to be normal her heart rate was found to be in the 30s.  She then got up and tried to walk around.  She states this did improve her heart rate into the 80s.  So she sat back down.  Shortly thereafter it happened again when she started feeling that shortness of breath.  She checked her heart rate and it was again found to be low.  She denies similar symptoms in the past.  She denies any chest pain or chest tightness.    Records reviewed. Per medical record review patient has a history of COPD, DM. Had recent admission for COPD, and pneumonia.   Past Medical History:  Diagnosis Date   Anemia    Arthritis    Asthma    Atherosclerosis of native arteries of extremity with intermittent claudication ([place]) 05/26/2016   Cancer ([place]) 2012   Right Lung CA   COPD (chronic obstructive pulmonary disease) ([place])    Depression    Diabetes mellitus without complication ([place])    Patient takes Janumet   Essential hypertension 05/26/2016   Hypercholesteremia    Oxygen dependent    2L at nite    PAD (peripheral artery disease) ([place]) 06/22/2016   Peripheral vascular disease (HCC)    Personal history of radiation therapy    Shortness of breath dyspnea    with exertion    Sialolithiasis    Sleep apnea    Wears dentures    full upper and lower    Patient Active Problem List   Diagnosis  Date Noted   Chronic respiratory failure with hypoxia ([place])    CAP (community acquired pneumonia) 06/26/2021   COPD exacerbation ([place]) 06/11/2021   SOB (shortness of breath) 05/01/2021   HCAP (healthcare-associated pneumonia) 05/01/2021   Chronic combined systolic and diastolic CHF (congestive heart failure) ([place]) 05/01/2021   Acute exacerbation of chronic obstructive pulmonary disease (COPD) ([place]) 04/30/2021   Elevated troponin 04/01/2021   Acute on chronic respiratory failure with hypoxia ([place]) 03/31/2021   Pneumothorax after biopsy 05/03/2020   Pneumothorax on right 05/01/2020   Chest pain on breathing 04/21/2020   Pleuritic chest pain 04/11/2020   Right lower lobe pulmonary nodule 04/02/2020   Iron deficiency anemia 04/02/2020   Pica in adults 04/02/2020   Goals of care, counseling/discussion 03/14/2020   Pain in left wrist 09/24/2019   Encounter for general adult medical examination with abnormal findings 06/28/2019   Elevated total protein 01/22/2019   Uncontrolled type 2 diabetes mellitus with hyperglycemia ([place]) 05/16/2018   Chronic obstructive pulmonary disease ([place]) 05/16/2018   Vitamin D deficiency 05/16/2018   Flu vaccine need 05/16/2018   Dysuria 05/16/2018   Cancer of lower lobe of right lung ([place]) 01/21/2018   Screening for breast cancer 10/12/2017   Personal history of tobacco use, presenting hazards to health 02/03/2017   PAD (peripheral artery  disease) ([place]) 06/22/2016   Essential hypertension 05/26/2016   Diabetes ([place]) 05/26/2016   Hyperlipidemia 05/26/2016   Atherosclerosis of native arteries of extremity with intermittent claudication ([place]) 05/26/2016   Uterine leiomyoma 04/30/2016   Elevated CEA 01/31/2016   Special screening for malignant neoplasms, colon    Benign neoplasm of sigmoid colon    Primary lung cancer ([place])    Malignant neoplasm of upper lobe of right lung [facility])     Past Surgical History:  Procedure Laterality Date   CESAREAN SECTION      x3   COLONOSCOPY WITH PROPOFOL N/A 06/25/2015   Procedure: COLONOSCOPY WITH PROPOFOL;  Surgeon: Lucilla Lame, MD;  Location: [facility] ENDOSCOPY;  Service: Endoscopy;  Laterality: N/A;   COLONOSCOPY WITH PROPOFOL N/A 07/26/2020   Procedure: COLONOSCOPY WITH PROPOFOL;  Surgeon: Lucilla Lame, MD;  Location: [place];  Service: Endoscopy;  Laterality: N/A;   CYST REMOVAL LEG     and on shoulder    ESOPHAGOGASTRODUODENOSCOPY (EGD) WITH PROPOFOL N/A 07/26/2020   Procedure: ESOPHAGOGASTRODUODENOSCOPY (EGD) WITH PROPOFOL;  Surgeon: Lucilla Lame, MD;  Location: [place];  Service: Endoscopy;  Laterality: N/A;  Diabetic - oral meds   LOWER EXTREMITY ANGIOGRAPHY Left 09/29/2018   Procedure: LOWER EXTREMITY ANGIOGRAPHY;  Surgeon: Algernon Huxley, MD;  Location: [place] CV [facility];  Service: Cardiovascular;  Laterality: Left;   LUNG BIOPSY  05 15 2013   has lung "spots"   PERIPHERAL VASCULAR CATHETERIZATION Left 06/01/2016   Procedure: Lower Extremity Angiography;  Surgeon: Algernon Huxley, MD;  Location: [place] [facility];  Service: Cardiovascular;  Laterality: Left;   PERIPHERAL VASCULAR CATHETERIZATION N/A 06/01/2016   Procedure: Abdominal Aortogram w/Lower Extremity;  Surgeon: Algernon Huxley, MD;  Location: [place] [facility];  Service: Cardiovascular;  Laterality: N/A;   PERIPHERAL VASCULAR CATHETERIZATION  06/01/2016   Procedure: Lower Extremity Intervention;  Surgeon: Algernon Huxley, MD;  Location: [place] [facility];  Service: Cardiovascular;;   PERIPHERAL VASCULAR CATHETERIZATION Right 06/08/2016   Procedure: Lower Extremity Angiography;  Surgeon: Algernon Huxley, MD;  Location: [place] [facility];  Service: Cardiovascular;  Laterality: Right;   PERIPHERAL VASCULAR CATHETERIZATION  06/08/2016   Procedure: Lower Extremity Intervention;  Surgeon: Algernon Huxley, MD;  Location: [place] [facility];  Service: Cardiovascular;;   SUBMANDIBULAR GLAND EXCISION Left 12/06/2020   Procedure:  EXCISION SUBMANDIBULAR GLAND;  Surgeon: Beverly Gust, MD;  Location: [place];  Service: ENT;  Laterality: Left;  needs to be first case Diabetic - diet controlled    Prior to Admission medications   Medication Sig Start Date End Date Taking? Authorizing Provider  Accu-Chek Softclix Lancets lancets Use as instructed for twice a daily Dx E11.65 04/11/21   [name], MD  albuterol (PROVENTIL) (2.5 MG/3ML) 0.083% nebulizer solution USE 1 VIAL IN NEBULIZER EVERY 6 HOURS AS NEEDED FOR WHEEZING 06/30/21   [name], MD  albuterol (VENTOLIN HFA) 108 (90 Base) MCG/ACT inhaler INHALE 2 PUFFS BY MOUTH EVERY 6 HOURS AS NEEDED FOR WHEEZING FOR SHORTNESS OF BREATH 06/26/21   [name], MD  ALPRAZolam Duanne Moron) 0.25 MG tablet Take 1 tablet (0.25 mg total) by mouth 3 (three) times daily as needed for anxiety or sleep. 06/17/21   Jonetta Osgood, NP  Blood Glucose Monitoring Suppl (ACCU-CHEK GUIDE) w/Device KIT Use as directed Dx e11.65 04/11/21   [name], MD  carvedilol (COREG) 3.125 MG tablet Take 1 tablet (3.125 mg total) by mouth 2 (  two) times daily with a meal. 05/08/21   Nicole Kindred A, DO  clopidogrel (PLAVIX) 75 MG tablet Take 1 tablet by mouth once daily 06/22/21   Jonetta Osgood, NP  escitalopram (LEXAPRO) 10 MG tablet Take 1 tablet (10 mg total) by mouth daily. 05/08/21   Ezekiel Slocumb, DO  famotidine (PEPCID) 20 MG tablet Take 1 tablet (20 mg total) by mouth daily for 14 days. 06/14/21 06/28/21  Nolberto Hanlon, MD  ferrous sulfate 325 (65 FE) MG tablet Take 325 mg by mouth 2 (two) times daily with a meal.    [provider]  fluticasone (FLONASE) 50 MCG/ACT nasal spray Place 2 sprays into both nostrils daily. 05/09/21   Ezekiel Slocumb, DO  gabapentin (NEURONTIN) 100 MG capsule Take 1 capsule (100 mg total) by mouth 2 (two) times daily. 04/08/21   Jonetta Osgood, NP  glucose blood (ACCU-CHEK GUIDE) test strip USE  STRIP TO CHECK GLUCOSE TWICE DAILY  06/08/21   [name], MD  insulin detemir (LEVEMIR FLEXTOUCH) 100 UNIT/ML FlexPen Inject 8- 12 units with supper qd// with needles 04/22/21   [name], MD  lisinopril (ZESTRIL) 10 MG tablet Take 1 tablet (10 mg total) by mouth daily. 05/09/21   Ezekiel Slocumb, DO  metFORMIN (GLUCOPHAGE) 500 MG tablet Take 1 tablet (500 mg total) by mouth daily with breakfast. 03/12/21   [name], MD  mometasone-formoterol [facility]) 200-5 MCG/ACT AERO Inhale 2 puffs into the lungs 2 (two) times daily. 06/17/21   Jonetta Osgood, NP  Omega-3 Fatty Acids (FISH OIL) 1000 MG CAPS Take 1 capsule by mouth daily.    [provider]  OXYGEN Inhale into the lungs.    [provider]  potassium chloride (KLOR-CON) 10 MEQ tablet TAKE 1 TABLET BY MOUTH EVERY OTHER DAY 04/29/21   [name], MD  predniSONE (DELTASONE) 20 MG tablet 2 tabs po daily for three more days 06/29/21   Loletha Grayer, MD  tiotropium (SPIRIVA HANDIHALER) 18 MCG inhalation capsule INHALE THE CONTENTS OF ONE CAPSULE BY MOUTH ONCE DAILY. DO NOT SWALLOW CAPSULE. 06/17/21   Jonetta Osgood, NP    Allergies Trelegy ellipta [fluticasone-umeclidin-vilant]  Family History  Problem Relation Age of Onset   Lung cancer Father    Diabetes Mother    Hypercholesterolemia Mother    Diabetes Sister    Diabetes Maternal Grandmother    Diabetes Paternal Grandmother    Diabetes Sister    Heart attack Brother    Coronary artery disease Brother    Vascular Disease Brother    Hypertension Sister    Heart attack Brother     Social History Social History   Tobacco Use   Smoking status: Former    Packs/day: 1.00    Years: 37.00    Pack years: 37.00    Types: Cigarettes    Quit date: 02/06/2010    Years since quitting: 11.4   Smokeless tobacco: Former    Types: Snuff  Vaping Use   Vaping Use: Never used  Substance Use Topics   Alcohol use: Not Currently    Alcohol/week: 5.0 standard drinks    Types: 5 Cans of beer  per week    Comment: /h x of alcohol abuse -stopped 2012- now drinks 5 beer per week   Drug use: Not Currently    Types: Marijuana, "Crack" cocaine, Cocaine    Comment: hx of cocaine use- last use 2015; last use marijuana6/22/19,    Review of Systems Constitutional:  No fever/chills Eyes: No visual changes. ENT: No sore throat. Cardiovascular: Positive for bradycardia. Respiratory: Denies shortness of breath. Gastrointestinal: No abdominal pain.  No nausea, no vomiting.  No diarrhea.   Genitourinary: Negative for dysuria. Musculoskeletal: Negative for back pain. Skin: Negative for rash. Neurological: Positive for dizziness.   ____________________________________________   PHYSICAL EXAM:  VITAL SIGNS: ED Triage Vitals [07/08/21 2109]  Enc Vitals Group     BP (!) 150/89     Pulse Rate 78     Resp 16     Temp 98.5 F (36.9 C)     Temp Source Oral     SpO2 100 %   Constitutional: Alert and oriented.  Eyes: Conjunctivae are normal.  ENT      Head: Normocephalic and atraumatic.      Nose: No congestion/rhinnorhea.      Mouth/Throat: Mucous membranes are moist.      Neck: No stridor. Hematological/Lymphatic/Immunilogical: No cervical lymphadenopathy. Cardiovascular: Normal rate, regular rhythm.  No murmurs, rubs, or gallops.  Respiratory: Normal respiratory effort without tachypnea nor retractions. Breath sounds are clear and equal bilaterally. No wheezes/rales/rhonchi. Gastrointestinal: Soft and non tender. No rebound. No guarding.  Genitourinary: Deferred Musculoskeletal: Normal range of motion in all extremities. No lower extremity edema. Neurologic:  Normal speech and language. No gross focal neurologic deficits are appreciated.  Skin:  Skin is warm, dry and intact. No rash noted. Psychiatric: Mood and affect are normal. Speech and behavior are normal. Patient exhibits appropriate insight and judgment.  ____________________________________________    LABS (pertinent  positives/negatives)  CBC wbc 5.1, hgb 10.5, plt 236 UA clear Trop hs 18 CMP wnl except glu 163, cr 0.52, AST 150, ALT 134 ____________________________________________   EKG  I, Nance Pear, attending physician, personally viewed and interpreted this EKG  EKG Time: 2104 Rate: 78 Rhythm: sinus rhythm Axis: normal Intervals: qtc 495 QRS: IVCD ST changes: no st elevation Impression: abnormal ekg   ____________________________________________    RADIOLOGY  None  ____________________________________________   PROCEDURES  Procedures  ____________________________________________   INITIAL IMPRESSION / ASSESSMENT AND PLAN / ED COURSE  Pertinent labs & imaging results that were available during my care of the patient were reviewed by me and considered in my medical decision making (see chart for details).   Patient presents to the emergency department today because of concerns for episodes of dizziness and bradycardia.  Patient denied any associated chest pain or chest tightness.  Here in the emergency department patient is not bradycardic.  EKG does show occasional premature atrial beats.  The patient initial troponin was negative.  No concerning electrolyte abnormalities.  Patient was placed on cardiac monitoring and at time of signout had not had any bradycardic episodes.  Awaiting second troponin at time of signout.  ____________________________________________   FINAL CLINICAL IMPRESSION(S) / ED DIAGNOSES  Final diagnoses:  Dizziness     Note: This dictation was prepared with Dragon dictation. Any transcriptional errors that result from this process are unintentional     Nance Pear, MD 07/09/21 0006

## 2021-07-08 NOTE — ED Triage Notes (Signed)
Patient BIB EMS with complaint of several episodes of dizziness associated with bradycardia. Patient with hx of COPD, wears 2LPM [place] chronically. Denies any CP/SOB, N/V/D. BG 167. Patient states the dizziness/bradycardia occurs when she is at rest and goes away with exertion. Patient denies any other symptoms, no pain. AxOx4, NAD.

## 2021-07-09 ENCOUNTER — Other Ambulatory Visit: Payer: Self-pay

## 2021-07-09 DIAGNOSIS — R42 Dizziness and giddiness: Secondary | ICD-10-CM

## 2021-07-09 DIAGNOSIS — R55 Syncope and collapse: Secondary | ICD-10-CM

## 2021-07-09 DIAGNOSIS — E1142 Type 2 diabetes mellitus with diabetic polyneuropathy: Secondary | ICD-10-CM | POA: Diagnosis not present

## 2021-07-09 DIAGNOSIS — R001 Bradycardia, unspecified: Secondary | ICD-10-CM | POA: Diagnosis not present

## 2021-07-09 DIAGNOSIS — I10 Essential (primary) hypertension: Secondary | ICD-10-CM

## 2021-07-09 LAB — CBC
HCT: 31 % — ABNORMAL LOW (ref 36.0–46.0)
Hemoglobin: 9.5 g/dL — ABNORMAL LOW (ref 12.0–15.0)
MCH: 29.3 pg (ref 26.0–34.0)
MCHC: 30.6 g/dL (ref 30.0–36.0)
MCV: 95.7 fL (ref 80.0–100.0)
Platelets: 219 10*3/uL (ref 150–400)
RBC: 3.24 MIL/uL — ABNORMAL LOW (ref 3.87–5.11)
RDW: 14.3 % (ref 11.5–15.5)
WBC: 4.2 10*3/uL (ref 4.0–10.5)
nRBC: 0 % (ref 0.0–0.2)

## 2021-07-09 LAB — BASIC METABOLIC PANEL
Anion gap: 4 — ABNORMAL LOW (ref 5–15)
BUN: 14 mg/dL (ref 8–23)
CO2: 30 mmol/L (ref 22–32)
Calcium: 8.2 mg/dL — ABNORMAL LOW (ref 8.9–10.3)
Chloride: 106 mmol/L (ref 98–111)
Creatinine, Ser: 0.52 mg/dL (ref 0.44–1.00)
GFR, Estimated: >60 mL/min (ref >60)
Glucose, Bld: 172 mg/dL — ABNORMAL HIGH (ref 70–99)
Potassium: 3.9 mmol/L (ref 3.5–5.1)
Sodium: 140 mmol/L (ref 135–145)

## 2021-07-09 LAB — CBG MONITORING, ED
Glucose-Capillary: 167 mg/dL — ABNORMAL HIGH (ref 70–99)
Glucose-Capillary: 115 mg/dL — ABNORMAL HIGH (ref 70–99)
Glucose-Capillary: 177 mg/dL — ABNORMAL HIGH (ref 70–99)
Glucose-Capillary: 219 mg/dL — ABNORMAL HIGH (ref 70–99)

## 2021-07-09 LAB — RESP PANEL BY RT-PCR (FLU A&B, COVID) ARPGX2
Influenza A by PCR: NEGATIVE
Influenza B by PCR: NEGATIVE
SARS Coronavirus 2 by RT PCR: NEGATIVE

## 2021-07-09 LAB — TROPONIN I (HIGH SENSITIVITY): Troponin I (High Sensitivity): 13 ng/L (ref <18)

## 2021-07-09 MED ORDER — ONDANSETRON HCL 4 MG/2ML IJ SOLN: 4.0000 mg | Freq: Four times a day (QID) | INTRAMUSCULAR | Status: DC | PRN

## 2021-07-09 MED ORDER — ALBUTEROL SULFATE (2.5 MG/3ML) 0.083% IN NEBU: 2.5000 mg | INHALATION_SOLUTION | RESPIRATORY_TRACT | Status: DC | PRN

## 2021-07-09 MED ORDER — TRAZODONE HCL 50 MG PO TABS
25.0000 mg | ORAL_TABLET | Freq: Every evening | ORAL | Status: DC | PRN
  Administered 2021-07-09: 25 mg via ORAL
  Filled 2021-07-09: qty 1

## 2021-07-09 MED ORDER — OMEGA-3-ACID ETHYL ESTERS 1 G PO CAPS
1.0000 g | ORAL_CAPSULE | Freq: Every day | ORAL | Status: DC
  Administered 2021-07-09 – 2021-07-10 (×2): 1 g via ORAL
  Filled 2021-07-09 (×2): qty 1

## 2021-07-09 MED ORDER — TIOTROPIUM BROMIDE MONOHYDRATE 18 MCG IN CAPS
18.0000 ug | ORAL_CAPSULE | Freq: Every day | RESPIRATORY_TRACT | Status: DC
  Administered 2021-07-10: 18 ug via RESPIRATORY_TRACT
  Filled 2021-07-09: qty 5

## 2021-07-09 MED ORDER — CLOPIDOGREL BISULFATE 75 MG PO TABS
75.0000 mg | ORAL_TABLET | Freq: Every day | ORAL | Status: DC
  Administered 2021-07-09 – 2021-07-10 (×2): 75 mg via ORAL
  Filled 2021-07-09 (×2): qty 1

## 2021-07-09 MED ORDER — ACETAMINOPHEN 325 MG RE SUPP: 650.0000 mg | Freq: Four times a day (QID) | RECTAL | Status: DC | PRN

## 2021-07-09 MED ORDER — ACETAMINOPHEN 325 MG PO TABS
650.0000 mg | ORAL_TABLET | Freq: Four times a day (QID) | ORAL | Status: DC | PRN
  Administered 2021-07-10: 650 mg via ORAL
  Filled 2021-07-09: qty 2

## 2021-07-09 MED ORDER — ENOXAPARIN SODIUM 40 MG/0.4ML IJ SOSY
40.0000 mg | PREFILLED_SYRINGE | INTRAMUSCULAR | Status: DC
  Administered 2021-07-09 – 2021-07-10 (×2): 40 mg via SUBCUTANEOUS
  Filled 2021-07-09 (×2): qty 0.4

## 2021-07-09 MED ORDER — LISINOPRIL 10 MG PO TABS
10.0000 mg | ORAL_TABLET | Freq: Every day | ORAL | Status: DC
  Administered 2021-07-09: 10 mg via ORAL
  Filled 2021-07-09 (×2): qty 1

## 2021-07-09 MED ORDER — MAGNESIUM HYDROXIDE 400 MG/5ML PO SUSP: 30.0000 mL | Freq: Every day | ORAL | Status: DC | PRN

## 2021-07-09 MED ORDER — FAMOTIDINE 20 MG PO TABS
20.0000 mg | ORAL_TABLET | Freq: Every day | ORAL | Status: DC
  Administered 2021-07-09 – 2021-07-10 (×2): 20 mg via ORAL
  Filled 2021-07-09 (×2): qty 1

## 2021-07-09 MED ORDER — INSULIN DETEMIR 100 UNIT/ML ~~LOC~~ SOLN
10.0000 [IU] | Freq: Every day | SUBCUTANEOUS | Status: DC
  Administered 2021-07-09: 10 [IU] via SUBCUTANEOUS
  Filled 2021-07-09 (×2): qty 0.1

## 2021-07-09 MED ORDER — FLUTICASONE PROPIONATE 50 MCG/ACT NA SUSP
2.0000 | Freq: Every day | NASAL | Status: DC
  Administered 2021-07-10: 2 via NASAL
  Filled 2021-07-09: qty 16

## 2021-07-09 MED ORDER — POTASSIUM CHLORIDE CRYS ER 20 MEQ PO TBCR
10.0000 meq | EXTENDED_RELEASE_TABLET | ORAL | Status: DC
  Administered 2021-07-10: 10 meq via ORAL
  Filled 2021-07-09: qty 1

## 2021-07-09 MED ORDER — CARVEDILOL 6.25 MG PO TABS: 3.1250 mg | ORAL_TABLET | Freq: Two times a day (BID) | ORAL | Status: DC

## 2021-07-09 MED ORDER — ESCITALOPRAM OXALATE 10 MG PO TABS
10.0000 mg | ORAL_TABLET | Freq: Every day | ORAL | Status: DC
  Administered 2021-07-09 – 2021-07-10 (×2): 10 mg via ORAL
  Filled 2021-07-09 (×2): qty 1

## 2021-07-09 MED ORDER — INSULIN ASPART 100 UNIT/ML IJ SOLN
0.0000 [IU] | Freq: Three times a day (TID) | INTRAMUSCULAR | Status: DC
Start: 2021-07-09 — End: 2021-07-10
  Administered 2021-07-09: 2 [IU] via SUBCUTANEOUS
  Administered 2021-07-09: 3 [IU] via SUBCUTANEOUS
  Administered 2021-07-09 – 2021-07-10 (×2): 2 [IU] via SUBCUTANEOUS
  Filled 2021-07-09 (×4): qty 1

## 2021-07-09 MED ORDER — ONDANSETRON HCL 4 MG PO TABS: 4.0000 mg | ORAL_TABLET | Freq: Four times a day (QID) | ORAL | Status: DC | PRN

## 2021-07-09 MED ORDER — SODIUM CHLORIDE 0.9 % IV SOLN: INTRAVENOUS | Status: DC

## 2021-07-09 MED ORDER — ALPRAZOLAM 0.5 MG PO TABS
0.2500 mg | ORAL_TABLET | Freq: Three times a day (TID) | ORAL | Status: DC | PRN
  Administered 2021-07-09 – 2021-07-10 (×3): 0.25 mg via ORAL
  Filled 2021-07-09 (×3): qty 1

## 2021-07-09 MED ORDER — FERROUS SULFATE 325 (65 FE) MG PO TABS
325.0000 mg | ORAL_TABLET | Freq: Two times a day (BID) | ORAL | Status: DC
  Administered 2021-07-09 – 2021-07-10 (×3): 325 mg via ORAL
  Filled 2021-07-09 (×3): qty 1

## 2021-07-09 NOTE — ED Notes (Signed)
Pt NAD in bed, a/ox4. Pt states she began having dizziness and weakness yesterday and states her HR was in the 30s. Pt denies ever having syncope, CP, SOB. +palpitations at times. Pt states she feels much much better currently, denying all symptoms.

## 2021-07-09 NOTE — Consult Note (Signed)
CARDIOLOGY CONSULT NOTE               [name] MRN: [id number] DOB/AGE: 03/18/1956 65 y.o.  Admit date: 07/08/2021 Referring Physician Dr. Richardean Chimera hospitalist Primary Physician Clayborn Bigness Primary Cardiologist  Reason for Consultation bradycardia  HPI: Patient presented with vertigo with bradycardia rates in the 22s she has been on Coreg she has a history of COPD lung cancer hypoxemia peripheral vascular disease diabetes with persistent recurrent bradycardia EKG suggested interventricular conduction delay related to probable incomplete left bundle patient did not have any frank syncope he denies any previous cardiac history has been treated for hypertension COPD lung cancer arteriosclerotic vascular disease hyperlipidemia status he felt poorly was found to be severely bradycardic to 30s related to her vertigo and dizziness the patient was admitted Coreg was stopped consideration for possible permanent temporary pacemaker was made with the patient remained hemodynamically stable and has done reasonably well heart rate is improved since  Review of systems complete and found to be negative unless listed above     Past Medical History:  Diagnosis Date   Anemia    Arthritis    Asthma    Atherosclerosis of native arteries of extremity with intermittent claudication ([place]) 05/26/2016   Cancer ([place]) 2012   Right Lung CA   COPD (chronic obstructive pulmonary disease) ([place])    Depression    Diabetes mellitus without complication ([place])    Patient takes Janumet   Essential hypertension 05/26/2016   Hypercholesteremia    Oxygen dependent    2L at nite    PAD (peripheral artery disease) ([place]) 06/22/2016   Peripheral vascular disease ([place])    Personal history of radiation therapy    Shortness of breath dyspnea    with exertion    Sialolithiasis    Sleep apnea    Wears dentures    full upper and lower    Past Surgical History:  Procedure Laterality Date   CESAREAN  SECTION     x3   COLONOSCOPY WITH PROPOFOL N/A 06/25/2015   Procedure: COLONOSCOPY WITH PROPOFOL;  Surgeon: Lucilla Lame, MD;  Location: [facility];  Service: Endoscopy;  Laterality: N/A;   COLONOSCOPY WITH PROPOFOL N/A 07/26/2020   Procedure: COLONOSCOPY WITH PROPOFOL;  Surgeon: Lucilla Lame, MD;  Location: [place];  Service: Endoscopy;  Laterality: N/A;   CYST REMOVAL LEG     and on shoulder    ESOPHAGOGASTRODUODENOSCOPY (EGD) WITH PROPOFOL N/A 07/26/2020   Procedure: ESOPHAGOGASTRODUODENOSCOPY (EGD) WITH PROPOFOL;  Surgeon: Lucilla Lame, MD;  Location: [place];  Service: Endoscopy;  Laterality: N/A;  Diabetic - oral meds   LOWER EXTREMITY ANGIOGRAPHY Left 09/29/2018   Procedure: LOWER EXTREMITY ANGIOGRAPHY;  Surgeon: Algernon Huxley, MD;  Location: [place] [facility];  Service: Cardiovascular;  Laterality: Left;   LUNG BIOPSY  05 15 2013   has lung "spots"   PERIPHERAL VASCULAR CATHETERIZATION Left 06/01/2016   Procedure: Lower Extremity Angiography;  Surgeon: Algernon Huxley, MD;  Location: [place] [facility];  Service: Cardiovascular;  Laterality: Left;   PERIPHERAL VASCULAR CATHETERIZATION N/A 06/01/2016   Procedure: Abdominal Aortogram w/Lower Extremity;  Surgeon: Algernon Huxley, MD;  Location: [place] [facility];  Service: Cardiovascular;  Laterality: N/A;   PERIPHERAL VASCULAR CATHETERIZATION  06/01/2016   Procedure: Lower Extremity Intervention;  Surgeon: Algernon Huxley, MD;  Location: [place] [facility];  Service: Cardiovascular;;   PERIPHERAL VASCULAR CATHETERIZATION Right 06/08/2016   Procedure: Lower Extremity  Angiography;  Surgeon: Algernon Huxley, MD;  Location: [place] [facility];  Service: Cardiovascular;  Laterality: Right;   PERIPHERAL VASCULAR CATHETERIZATION  06/08/2016   Procedure: Lower Extremity Intervention;  Surgeon: Algernon Huxley, MD;  Location: [place] [facility];  Service: Cardiovascular;;   SUBMANDIBULAR GLAND EXCISION Left 12/06/2020    Procedure: EXCISION SUBMANDIBULAR GLAND;  Surgeon: Beverly Gust, MD;  Location: [place];  Service: ENT;  Laterality: Left;  needs to be first case Diabetic - diet controlled    (Not in a hospital admission)  Social History   Socioeconomic History   Marital status: Widowed    Spouse name: Not on file   Number of children: Not on file   Years of education: Not on file   Highest education level: Not on file  Occupational History   Not on file  Tobacco Use   Smoking status: Former    Packs/day: 1.00    Years: 37.00    Pack years: 37.00    Types: Cigarettes    Quit date: 02/06/2010    Years since quitting: 11.4   Smokeless tobacco: Former    Types: Snuff  Vaping Use   Vaping Use: Never used  Substance and Sexual Activity   Alcohol use: Not Currently    Alcohol/week: 5.0 standard drinks    Types: 5 Cans of beer per week    Comment: /h x of alcohol abuse -stopped 2012- now drinks 5 beer per week   Drug use: Not Currently    Types: Marijuana, "Crack" cocaine, Cocaine    Comment: hx of cocaine use- last use 2015; last use marijuana6/22/19,   Sexual activity: Yes  Other Topics Concern   Not on file  Social History Narrative   Lives with Significant Other x 43 years   Social Determinants of Health   Financial Resource Strain: Not on file  Food Insecurity: Not on file  Transportation Needs: Not on file  Physical Activity: Not on file  Stress: Not on file  Social Connections: Not on file  Intimate Partner Violence: Not on file    Family History  Problem Relation Age of Onset   Lung cancer Father    Diabetes Mother    Hypercholesterolemia Mother    Diabetes Sister    Diabetes Maternal Grandmother    Diabetes Paternal Grandmother    Diabetes Sister    Heart attack Brother    Coronary artery disease Brother    Vascular Disease Brother    Hypertension Sister    Heart attack Brother       Review of systems complete and found to be negative unless  listed above      PHYSICAL EXAM  General: Well developed, well nourished, in no acute distress HEENT:  Normocephalic and atramatic Neck:  No JVD.  Lungs: Clear bilaterally to auscultation and percussion. Heart: HRRR . Normal S1 and S2 without gallops or murmurs.  Abdomen: Bowel sounds are positive, abdomen soft and non-tender  Msk:  Back normal, normal gait. Normal strength and tone for age. Extremities: No clubbing, cyanosis or edema.   Neuro: Alert and oriented X 3. Psych:  Good affect, responds appropriately  Labs:   Lab Results  Component Value Date   WBC 4.2 07/09/2021   HGB 9.5 (L) 07/09/2021   HCT 31.0 (L) 07/09/2021   MCV 95.7 07/09/2021   PLT 219 07/09/2021    Recent Labs  Lab 07/08/21 2132 07/09/21 0722  NA 135 140  K 3.7 3.9  CL 102 106  CO2 27 30  BUN 23 14  CREATININE 0.52 0.52  CALCIUM 8.7* 8.2*  PROT 6.8  --   BILITOT 0.6  --   ALKPHOS 80  --   ALT 134*  --   AST 150*  --   GLUCOSE 163* 172*   No results found for: CKTOTAL, CKMB, CKMBINDEX, TROPONINI  Lab Results  Component Value Date   CHOL 160 03/28/2020   CHOL 218 (H) 06/22/2018   Lab Results  Component Value Date   HDL 66 03/28/2020   HDL 66 06/22/2018   Lab Results  Component Value Date   LDLCALC 81 03/28/2020   LDLCALC 115 (H) 06/22/2018   Lab Results  Component Value Date   TRIG 68 03/28/2020   TRIG 183 (H) 06/22/2018   No results found for: CHOLHDL No results found for: LDLDIRECT    Radiology: DG Chest 2 View  Result Date: 06/26/2021 CLINICAL DATA:  Shortness of breath EXAM: CHEST - 2 VIEW COMPARISON:  06/11/2021 FINDINGS: Background emphysema. Persistent but improved opacities at the right greater than left lung bases. No significant pleural effusion. No pneumothorax. Stable cardiomediastinal contours with normal heart size. IMPRESSION: Persistent but improved pneumonia at the right greater than left lung bases. Electronically Signed   By: Macy Mis M.D.   On:  06/26/2021 13:24   CT Angio Chest Pulmonary Embolism (PE) W or WO Contrast  Result Date: 06/26/2021 CLINICAL DATA:  Difficulty breathing, COPD EXAM: CT ANGIOGRAPHY CHEST WITH CONTRAST TECHNIQUE: Multidetector CT imaging of the chest was performed using the standard protocol during bolus administration of intravenous contrast. Multiplanar CT image reconstructions and MIPs were obtained to evaluate the vascular anatomy. CONTRAST:  52mL OMNIPAQUE IOHEXOL 350 MG/ML SOLN COMPARISON:  05/01/2021 FINDINGS: Cardiovascular: There is homogeneous enhancement in thoracic aorta. There are no intraluminal filling defects in the central pulmonary artery branches. There is slightly inhomogeneous attenuation in the small peripheral branches in the both lower lobes. In the image 59 of series 4, there is questionable exophytic low-density in the anterior margin of 1 of the segmental branches in the left lower lobe which could not be distinctly seen in the adjacent images suggesting possible artifact. Evaluation is limited by motion artifacts in both lungs and infiltrates/scarring in the right lung. Mediastinum/Nodes: No new significant lymphadenopathy seen Lungs/Pleura: Marked bronchospasm seen in the previous study is not evident in the current study. Centrilobular and panlobular emphysema is seen in both lungs. There is small patchy infiltrate in lateral aspect of right apex with no significant change. There is focal alveolar density in the posterior segment of right upper lobe with no significant change. There are patchy infiltrates in the right lower lobe with no significant interval change. Increased interstitial markings are seen in the right lung with no significant change suggesting scarring. There are no new focal pulmonary infiltrates. There is no significant pleural effusion or pneumothorax. Upper Abdomen: There is fatty infiltration in the liver Musculoskeletal: Unremarkable Review of the MIP images confirms the above  findings. IMPRESSION: There is no evidence central pulmonary artery embolism. Evaluation of small peripheral pulmonary artery branches is limited by motion artifacts and infiltrates, especially in the lower lobes. There is no evidence of right ventricular strain. COPD. There are patchy infiltrates and fibrotic changes in the right lung with no significant interval change. There is no pleural effusion or pneumothorax. Electronically Signed   By: Elmer Picker M.D.   On: 06/26/2021 16:51   DG Chest Portable 1  View  Result Date: 06/11/2021 CLINICAL DATA:  Shortness of breath, wheezing. EXAM: PORTABLE CHEST 1 VIEW COMPARISON:  Chest radiograph 05/06/2021; CT chest 05/01/2021 FINDINGS: Upper lobe predominant emphysematous changes with reticulation at the lung bases. Interstitial and patchy airspace opacities in the mid and lower lungs, right greater than left. No pleural effusion or pneumothorax. Scarring at the right apex. Heart is normal in size. No acute osseous abnormality. IMPRESSION: Interstitial and patchy airspace opacities in the mid and lower lungs, right-greater-than-left. Findings are concerning for multifocal pneumonia in the appropriate clinical context. Emphysema (ICD10-J43.9). Electronically Signed   By: Ileana Roup M.D.   On: 06/11/2021 14:20    EKG: Sinus bradycardia rate of 58 nonspecific findings  ASSESSMENT AND PLAN:  Symptomatic bradycardia Vertigo COPD History of b lung cancer Peripheral vascular disease Diabetes . Plan Continue telemetry for bradycardia Discontinue Coreg as it was done Maintain adequate hydration Continue supplemental oxygen as necessary Inhalers as necessary for COPD Continue diabetes management and control Recommend sleep study CPAP if indicated Clear indication for permanent pacemaker at this point Would recommend conservative cardiac input Patient follow-up with her cardiologist upon discharge  Signed: Yolonda Kida MD 07/09/2021, 1:18  PM

## 2021-07-09 NOTE — ED Provider Notes (Signed)
Assumed care at signout.  Patient here for episodes of lightheadedness, near syncope in the setting of bradycardia at home.  Does appear she is on carvedilol that was started in September.  She denies any chest pain or shortness of breath.  Has not had any dizziness or bradycardia noted here.  Troponin x2 negative.  Family states they would feel more comfortable with her being observed in the hospital.   1:07 AM Discussed patient's case with hospitalist, Dr. Sidney Ace.  I have recommended admission and patient (and family if present) agree with this plan. Admitting physician will place admission orders.   I reviewed all nursing notes, vitals, pertinent previous records and reviewed/interpreted all EKGs, lab and urine results, imaging (as available).    Angela Jensen, Angela Bison, DO 07/09/21 [phone]

## 2021-07-09 NOTE — Progress Notes (Signed)
  INTERVAL PROGRESS NOTE    Angela Jensen- 65 y.o. female  LOS: 0 __________________________________________________________________  SUBJECTIVE: Admitted 07/08/2021 with cc of pre-syncope Chief Complaint  Patient presents with   Dizziness   Since admission, patient has had no more episodes of presyncope. She feels improved. She is on 3L O2 at baseline and is currently on same without any respiratory distress at rest. She would really like to go home tonight so she can start cooking for Thanksgiving.  OBJECTIVE: Blood pressure 136/68, pulse 61, temperature 98.5 F (36.9 C), temperature source Oral, resp. rate 14, SpO2 100 %.  General: NAD, pleasant, able to participate in exam Cardiac: RRR, normal heart sounds, no murmurs. 2+ radial and PT pulses bilaterally Respiratory: CTAB, normal effort, No wheezes, rales or rhonchi Abdomen: soft, nontender, nondistended, no hepatic or splenomegaly, +BS Extremities: no edema. WWP. Skin: warm and dry, no rashes noted Neuro: alert and oriented, no focal deficits Psych: Normal affect and mood   ASSESSMENT/PLAN:  I have reviewed the full H&P by Dr. Sidney Ace, and I agree with the assessment and plan as outlined therein. In addition:   HFrEF- given HR dx, and having stable vital signs, will discontinue her IV fluids at this time.   - orthostatic vital signs am  - ambulate with pulse-ox  - f/u cardiology recommendations  - continue to hold carvedilol- can consider adding back on bb at lower dose and lowering dose of lisinopril (prefer to change her to ARB at lower dose) so she can get cardioprotective effect of the medications without the side effects.  - monitor on tele overnight  Normocytic anemia- at baseline. Hgb 10.5. unlikely to be contributing factor.   Principal Problem:   Symptomatic bradycardia   Richarda Osmond, DO [facility] 07/09/2021, 7:20 AM    [email or web address] Available by Epic secure chat 7AM-7PM. If 7PM-7AM,  please contact night-coverage   No Charge

## 2021-07-09 NOTE — H&P (Signed)
[facility]   PATIENT NAME: Angela Jensen    MR#:  [id number]  DATE OF BIRTH:  06-Jun-1956  DATE OF ADMISSION:  07/08/2021  PRIMARY CARE PHYSICIAN: [name], MD   Patient is coming from: Home  REQUESTING/REFERRING PHYSICIAN: Lurline Hare, MD  CHIEF COMPLAINT:   Chief Complaint  Patient presents with   Dizziness    HISTORY OF PRESENT ILLNESS:  Angela Jensen is a 65 y.o. African-American female with medical history significant for HPI: Angela Jensen is a 65 y.o. female with medical history significant for COPD with chronic respiratory failure on 2.5 l of oxygen continuous, history of Lung CA (  RUL adenocarcinoma s/p radiation 2013 and 2021), depressison, DM and PAD, presented to the emergency room with acute onset of dizziness and lightheadedness with presyncope.  She checked her heart rate and it was in the 30s.  She was started on p.o. Coreg by the end of September of this year.  She denies any chest pain or palpitations.  No cough or wheezing.  Dyspnea or hemoptysis.  No fever or chills.  No nausea or vomiting or abdominal pain or diarrhea.  No bleeding diathesis.  ED Course: Upon presentation to the emergency room vital signs showed a blood pressure 130/89 with respiratory to of 21 and heart rate was 83.  CMP was remarkable for elevated AST 150 and ALT of 134 and blood glucose was 163.  CBC showed anemia better than previous levels.  EKG as reviewed by me : EKG showed normal sinus rhythm rate of 78 with PACs and IVCD likely with left bundle branch block.  The patient was given 500 mill IV normal saline bolus.  PAST MEDICAL HISTORY:   Past Medical History:  Diagnosis Date   Anemia    Arthritis    Asthma    Atherosclerosis of native arteries of extremity with intermittent claudication ([place]) 05/26/2016   Cancer ([place]) 2012   Right Lung CA   COPD (chronic obstructive pulmonary disease) ([place])    Depression    Diabetes mellitus without complication ([place])    Patient takes  Janumet   Essential hypertension 05/26/2016   Hypercholesteremia    Oxygen dependent    2L at nite    PAD (peripheral artery disease) ([place]) 06/22/2016   Peripheral vascular disease (H[facility])    Personal history of radiation therapy    Shortness of breath dyspnea    with exertion    Sialolithiasis    Sleep apnea    Wears dentures    full upper and lower    PAST SURGICAL HISTORY:   Past Surgical History:  Procedure Laterality Date   CESAREAN SECTION     x3   COLONOSCOPY WITH PROPOFOL N/A 06/25/2015   Procedure: COLONOSCOPY WITH PROPOFOL;  Surgeon: Lucilla Lame, MD;  Location: [facility];  Service: Endoscopy;  Laterality: N/A;   COLONOSCOPY WITH PROPOFOL N/A 07/26/2020   Procedure: COLONOSCOPY WITH PROPOFOL;  Surgeon: Lucilla Lame, MD;  Location: [place];  Service: Endoscopy;  Laterality: N/A;   CYST REMOVAL LEG     and on shoulder    ESOPHAGOGASTRODUODENOSCOPY (EGD) WITH PROPOFOL N/A 07/26/2020   Procedure: ESOPHAGOGASTRODUODENOSCOPY (EGD) WITH PROPOFOL;  Surgeon: Lucilla Lame, MD;  Location: [place];  Service: Endoscopy;  Laterality: N/A;  Diabetic - oral meds   LOWER EXTREMITY ANGIOGRAPHY Left 09/29/2018   Procedure: LOWER EXTREMITY ANGIOGRAPHY;  Surgeon: Algernon Huxley, MD;  Location: [place] [facility];  Service: Cardiovascular;  Laterality: Left;   LUNG BIOPSY  05 15 2013   has lung "spots"   PERIPHERAL VASCULAR CATHETERIZATION Left 06/01/2016   Procedure: Lower Extremity Angiography;  Surgeon: Algernon Huxley, MD;  Location: [place] [facility];  Service: Cardiovascular;  Laterality: Left;   PERIPHERAL VASCULAR CATHETERIZATION N/A 06/01/2016   Procedure: Abdominal Aortogram w/Lower Extremity;  Surgeon: Algernon Huxley, MD;  Location: [facility];  Service: Cardiovascular;  Laterality: N/A;   PERIPHERAL VASCULAR CATHETERIZATION  06/01/2016   Procedure: Lower Extremity Intervention;  Surgeon: Algernon Huxley, MD;  Location: [facility] [facility];  Service:  Cardiovascular;;   PERIPHERAL VASCULAR CATHETERIZATION Right 06/08/2016   Procedure: Lower Extremity Angiography;  Surgeon: Algernon Huxley, MD;  Location: [facility];  Service: Cardiovascular;  Laterality: Right;   PERIPHERAL VASCULAR CATHETERIZATION  06/08/2016   Procedure: Lower Extremity Intervention;  Surgeon: Algernon Huxley, MD;  Location: [facility];  Service: Cardiovascular;;   SUBMANDIBULAR GLAND EXCISION Left 12/06/2020   Procedure: EXCISION SUBMANDIBULAR GLAND;  Surgeon: Beverly Gust, MD;  Location: [place];  Service: ENT;  Laterality: Left;  needs to be first case Diabetic - diet controlled    SOCIAL HISTORY:   Social History   Tobacco Use   Smoking status: Former    Packs/day: 1.00    Years: 37.00    Pack years: 37.00    Types: Cigarettes    Quit date: 02/06/2010    Years since quitting: 11.4   Smokeless tobacco: Former    Types: Snuff  Substance Use Topics   Alcohol use: Not Currently    Alcohol/week: 5.0 standard drinks    Types: 5 Cans of beer per week    Comment: /h x of alcohol abuse -stopped 2012- now drinks 5 beer per week    FAMILY HISTORY:   Family History  Problem Relation Age of Onset   Lung cancer Father    Diabetes Mother    Hypercholesterolemia Mother    Diabetes Sister    Diabetes Maternal Grandmother    Diabetes Paternal Grandmother    Diabetes Sister    Heart attack Brother    Coronary artery disease Brother    Vascular Disease Brother    Hypertension Sister    Heart attack Brother     DRUG ALLERGIES:   Allergies  Allergen Reactions   Trelegy Ellipta [Fluticasone-Umeclidin-Vilant]     REVIEW OF SYSTEMS:   ROS As per history of present illness. All pertinent systems were reviewed above. Constitutional, HEENT, cardiovascular, respiratory, GI, GU, musculoskeletal, neuro, psychiatric, endocrine, integumentary and hematologic systems were reviewed and are otherwise negative/unremarkable except for positive  findings mentioned above in the HPI.   MEDICATIONS AT HOME:   Prior to Admission medications   Medication Sig Start Date End Date Taking? Authorizing Provider  Accu-Chek Softclix Lancets lancets Use as instructed for twice a daily Dx E11.65 04/11/21   [name], MD  albuterol (PROVENTIL) (2.5 MG/3ML) 0.083% nebulizer solution USE 1 VIAL IN NEBULIZER EVERY 6 HOURS AS NEEDED FOR WHEEZING 06/30/21   [name], MD  albuterol (VENTOLIN HFA) 108 (90 Base) MCG/ACT inhaler INHALE 2 PUFFS BY MOUTH EVERY 6 HOURS AS NEEDED FOR WHEEZING FOR SHORTNESS OF BREATH 06/26/21   [name], MD  ALPRAZolam Duanne Moron) 0.25 MG tablet Take 1 tablet (0.25 mg total) by mouth 3 (three) times daily as needed for anxiety or sleep. 06/17/21   Jonetta Osgood, NP  Blood Glucose Monitoring Suppl (ACCU-CHEK  GUIDE) w/Device KIT Use as directed Dx e11.65 04/11/21   [name], MD  carvedilol (COREG) 3.125 MG tablet Take 1 tablet (3.125 mg total) by mouth 2 (two) times daily with a meal. 05/08/21   Nicole Kindred A, DO  clopidogrel (PLAVIX) 75 MG tablet Take 1 tablet by mouth once daily 06/22/21   Jonetta Osgood, NP  escitalopram (LEXAPRO) 10 MG tablet Take 1 tablet (10 mg total) by mouth daily. 05/08/21   Ezekiel Slocumb, DO  famotidine (PEPCID) 20 MG tablet Take 1 tablet (20 mg total) by mouth daily for 14 days. 06/14/21 06/28/21  Nolberto Hanlon, MD  ferrous sulfate 325 (65 FE) MG tablet Take 325 mg by mouth 2 (two) times daily with a meal.    [provider]  fluticasone (FLONASE) 50 MCG/ACT nasal spray Place 2 sprays into both nostrils daily. 05/09/21   Ezekiel Slocumb, DO  gabapentin (NEURONTIN) 100 MG capsule Take 1 capsule (100 mg total) by mouth 2 (two) times daily. 04/08/21   Jonetta Osgood, NP  glucose blood (ACCU-CHEK GUIDE) test strip USE  STRIP TO CHECK GLUCOSE TWICE DAILY 06/08/21   [name], MD  insulin detemir (LEVEMIR FLEXTOUCH) 100 UNIT/ML FlexPen Inject 8- 12 units with supper qd//  with needles 04/22/21   [name], MD  lisinopril (ZESTRIL) 10 MG tablet Take 1 tablet (10 mg total) by mouth daily. 05/09/21   Ezekiel Slocumb, DO  metFORMIN (GLUCOPHAGE) 500 MG tablet Take 1 tablet (500 mg total) by mouth daily with breakfast. 03/12/21   [name], MD  mometasone-formoterol [facility]) 200-5 MCG/ACT AERO Inhale 2 puffs into the lungs 2 (two) times daily. 06/17/21   Jonetta Osgood, NP  Omega-3 Fatty Acids (FISH OIL) 1000 MG CAPS Take 1 capsule by mouth daily.    [provider]  OXYGEN Inhale into the lungs.    [provider]  potassium chloride (KLOR-CON) 10 MEQ tablet TAKE 1 TABLET BY MOUTH EVERY OTHER DAY 04/29/21   [name], MD  predniSONE (DELTASONE) 20 MG tablet 2 tabs po daily for three more days 06/29/21   Loletha Grayer, MD  tiotropium (SPIRIVA HANDIHALER) 18 MCG inhalation capsule INHALE THE CONTENTS OF ONE CAPSULE BY MOUTH ONCE DAILY. DO NOT SWALLOW CAPSULE. 06/17/21   Jonetta Osgood, NP      VITAL SIGNS:  Blood pressure 129/69, pulse 71, temperature 98.5 F (36.9 C), temperature source Oral, resp. rate 16, SpO2 100 %.  PHYSICAL EXAMINATION:  Physical Exam  GENERAL:  65 y.o.-year-old Afro-American female patient lying in the bed with no acute distress.  EYES: Pupils equal, round, reactive to light and accommodation. No scleral icterus. Extraocular muscles intact.  HEENT: Head atraumatic, normocephalic. Oropharynx and nasopharynx clear.  NECK:  Supple, no jugular venous distention. No thyroid enlargement, no tenderness.  LUNGS: Normal breath sounds bilaterally, no wheezing, rales,rhonchi or crepitation. No use of accessory muscles of respiration.  CARDIOVASCULAR: Regular rate and rhythm, S1, S2 normal. No murmurs, rubs, or gallops.  ABDOMEN: Soft, nondistended, nontender. Bowel sounds present. No organomegaly or mass.  EXTREMITIES: No pedal edema, cyanosis, or clubbing.  NEUROLOGIC: Cranial nerves II through XII are intact. Muscle  strength 5/5 in all extremities. Sensation intact. Gait not checked.  PSYCHIATRIC: The patient is alert and oriented x 3.  Normal affect and good eye contact. SKIN: No obvious rash, lesion, or ulcer.   LABORATORY PANEL:   CBC Recent Labs  Lab 07/08/21 2132  WBC 5.1  HGB 10.5*  HCT 33.6*  PLT 236   ------------------------------------------------------------------------------------------------------------------  Chemistries  Recent Labs  Lab 07/08/21 2132  NA 135  K 3.7  CL 102  CO2 27  GLUCOSE 163*  BUN 23  CREATININE 0.52  CALCIUM 8.7*  AST 150*  ALT 134*  ALKPHOS 80  BILITOT 0.6   ------------------------------------------------------------------------------------------------------------------  Cardiac Enzymes No results for input(s): TROPONINI in the last 168 hours. ------------------------------------------------------------------------------------------------------------------  RADIOLOGY:  No results found.    IMPRESSION AND PLAN:  Principal Problem:   Symptomatic bradycardia  1.  Presyncope likely secondary to symptomatic bradycardia. - The patient will be admitted to an observation cardiac telemetry bed. - We will hold off her Coreg. - Cardiology consult will be obtained. - I notified Dr. Clayborn Bigness about the patient.  2.  Essential hypertension. - We will continue Lisinopril on hold off Coreg.  3.  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 - The patient will be placed on supplement coverage with NovoLog. - We will continue basal coverage. - We will continue Neurontin.  4.  Dyslipidemia. - We will continue Toprol-XL.  5.  COPD without exacerbation. - We will continue her Spiriva and hold off Dulera.  We will continue.  And albuterol.  6.  Anxiety and depression. - We will continue Lexapro and as needed Xanax.   DVT prophylaxis: Lovenox. Code Status: full code. Family Communication:  The plan of care was discussed in details  with the patient (and family). I answered all questions. The patient agreed to proceed with the above mentioned plan. Further management will depend upon hospital course. Disposition Plan: Back to previous home environment Consults called: Cardiology. All the records are reviewed and case discussed with ED provider.  Status is: Observation  Remains inpatient appropriate because:Ongoing diagnostic testing needed not appropriate for outpatient work up, Unsafe d/c plan, IV treatments appropriate due to intensity of illness or inability to take PO, and Inpatient level of care appropriate due to severity of illness   Dispo: The patient is from: Home              Anticipated d/c is to: Home              Patient currently is not medically stable to d/c.              Difficult to place patient: No  TOTAL TIME TAKING CARE OF THIS PATIENT: 55 minutes.     Christel Mormon M.D on 07/09/2021 at 1:55 AM  [facility]   From 7 PM-7 AM, contact night-coverage [email or web address]  CC: Primary care physician; [name], MD

## 2021-07-10 ENCOUNTER — Encounter: Payer: Self-pay | Admitting: Emergency Medicine

## 2021-07-10 ENCOUNTER — Inpatient Hospital Stay
Admission: EM | Admit: 2021-07-10 | Discharge: 2021-07-15 | DRG: 228 | Disposition: A | Discharge status: Home / Self Care | Payer: Medicare Other | Attending: Internal Medicine | Admitting: Internal Medicine

## 2021-07-10 ENCOUNTER — Emergency Department: Payer: Medicare Other

## 2021-07-10 ENCOUNTER — Other Ambulatory Visit: Payer: Self-pay

## 2021-07-10 DIAGNOSIS — Z7902 Long term (current) use of antithrombotics/antiplatelets: Secondary | ICD-10-CM

## 2021-07-10 DIAGNOSIS — R001 Bradycardia, unspecified: Secondary | ICD-10-CM | POA: Diagnosis not present

## 2021-07-10 DIAGNOSIS — E7800 Pure hypercholesterolemia, unspecified: Secondary | ICD-10-CM | POA: Diagnosis present

## 2021-07-10 DIAGNOSIS — J431 Panlobular emphysema: Secondary | ICD-10-CM | POA: Diagnosis present

## 2021-07-10 DIAGNOSIS — I5042 Chronic combined systolic (congestive) and diastolic (congestive) heart failure: Secondary | ICD-10-CM | POA: Diagnosis present

## 2021-07-10 DIAGNOSIS — I959 Hypotension, unspecified: Secondary | ICD-10-CM

## 2021-07-10 DIAGNOSIS — Z79899 Other long term (current) drug therapy: Secondary | ICD-10-CM

## 2021-07-10 DIAGNOSIS — E1151 Type 2 diabetes mellitus with diabetic peripheral angiopathy without gangrene: Secondary | ICD-10-CM | POA: Diagnosis present

## 2021-07-10 DIAGNOSIS — E872 Acidosis, unspecified: Secondary | ICD-10-CM | POA: Diagnosis present

## 2021-07-10 DIAGNOSIS — E1165 Type 2 diabetes mellitus with hyperglycemia: Secondary | ICD-10-CM | POA: Diagnosis not present

## 2021-07-10 DIAGNOSIS — Z8701 Personal history of pneumonia (recurrent): Secondary | ICD-10-CM

## 2021-07-10 DIAGNOSIS — Z9981 Dependence on supplemental oxygen: Secondary | ICD-10-CM

## 2021-07-10 DIAGNOSIS — D649 Anemia, unspecified: Secondary | ICD-10-CM | POA: Diagnosis present

## 2021-07-10 DIAGNOSIS — Z833 Family history of diabetes mellitus: Secondary | ICD-10-CM

## 2021-07-10 DIAGNOSIS — T380X5A Adverse effect of glucocorticoids and synthetic analogues, initial encounter: Secondary | ICD-10-CM | POA: Diagnosis not present

## 2021-07-10 DIAGNOSIS — Z85118 Personal history of other malignant neoplasm of bronchus and lung: Secondary | ICD-10-CM

## 2021-07-10 DIAGNOSIS — Z20822 Contact with and (suspected) exposure to covid-19: Secondary | ICD-10-CM | POA: Diagnosis present

## 2021-07-10 DIAGNOSIS — J441 Chronic obstructive pulmonary disease with (acute) exacerbation: Secondary | ICD-10-CM

## 2021-07-10 DIAGNOSIS — Z923 Personal history of irradiation: Secondary | ICD-10-CM

## 2021-07-10 DIAGNOSIS — Z7984 Long term (current) use of oral hypoglycemic drugs: Secondary | ICD-10-CM

## 2021-07-10 DIAGNOSIS — E876 Hypokalemia: Secondary | ICD-10-CM | POA: Diagnosis not present

## 2021-07-10 DIAGNOSIS — Z801 Family history of malignant neoplasm of trachea, bronchus and lung: Secondary | ICD-10-CM

## 2021-07-10 DIAGNOSIS — E1142 Type 2 diabetes mellitus with diabetic polyneuropathy: Secondary | ICD-10-CM | POA: Diagnosis present

## 2021-07-10 DIAGNOSIS — J9621 Acute and chronic respiratory failure with hypoxia: Secondary | ICD-10-CM

## 2021-07-10 DIAGNOSIS — Z794 Long term (current) use of insulin: Secondary | ICD-10-CM

## 2021-07-10 DIAGNOSIS — I442 Atrioventricular block, complete: Secondary | ICD-10-CM

## 2021-07-10 DIAGNOSIS — Y92239 Unspecified place in hospital as the place of occurrence of the external cause: Secondary | ICD-10-CM | POA: Diagnosis not present

## 2021-07-10 DIAGNOSIS — Z950 Presence of cardiac pacemaker: Secondary | ICD-10-CM

## 2021-07-10 DIAGNOSIS — R0602 Shortness of breath: Secondary | ICD-10-CM

## 2021-07-10 DIAGNOSIS — Z8249 Family history of ischemic heart disease and other diseases of the circulatory system: Secondary | ICD-10-CM

## 2021-07-10 DIAGNOSIS — Z0189 Encounter for other specified special examinations: Secondary | ICD-10-CM

## 2021-07-10 DIAGNOSIS — G4733 Obstructive sleep apnea (adult) (pediatric): Secondary | ICD-10-CM | POA: Diagnosis present

## 2021-07-10 DIAGNOSIS — Z87891 Personal history of nicotine dependence: Secondary | ICD-10-CM

## 2021-07-10 DIAGNOSIS — R0902 Hypoxemia: Secondary | ICD-10-CM

## 2021-07-10 DIAGNOSIS — E11649 Type 2 diabetes mellitus with hypoglycemia without coma: Secondary | ICD-10-CM | POA: Diagnosis not present

## 2021-07-10 DIAGNOSIS — Z83438 Family history of other disorder of lipoprotein metabolism and other lipidemia: Secondary | ICD-10-CM

## 2021-07-10 DIAGNOSIS — F419 Anxiety disorder, unspecified: Secondary | ICD-10-CM

## 2021-07-10 DIAGNOSIS — I447 Left bundle-branch block, unspecified: Secondary | ICD-10-CM | POA: Diagnosis present

## 2021-07-10 DIAGNOSIS — I110 Hypertensive heart disease with heart failure: Secondary | ICD-10-CM | POA: Diagnosis present

## 2021-07-10 DIAGNOSIS — Z006 Encounter for examination for normal comparison and control in clinical research program: Secondary | ICD-10-CM

## 2021-07-10 LAB — COMPREHENSIVE METABOLIC PANEL
ALT: 81 U/L — ABNORMAL HIGH (ref 0–44)
AST: 57 U/L — ABNORMAL HIGH (ref 15–41)
Albumin: 3.4 g/dL — ABNORMAL LOW (ref 3.5–5.0)
Alkaline Phosphatase: 71 U/L (ref 38–126)
Anion gap: 5 (ref 5–15)
BUN: 21 mg/dL (ref 8–23)
CO2: 30 mmol/L (ref 22–32)
Calcium: 8.2 mg/dL — ABNORMAL LOW (ref 8.9–10.3)
Chloride: 103 mmol/L (ref 98–111)
Creatinine, Ser: 0.88 mg/dL (ref 0.44–1.00)
GFR, Estimated: >60 mL/min (ref >60)
Glucose, Bld: 319 mg/dL — ABNORMAL HIGH (ref 70–99)
Potassium: 4.3 mmol/L (ref 3.5–5.1)
Sodium: 138 mmol/L (ref 135–145)
Total Bilirubin: 0.4 mg/dL (ref 0.3–1.2)
Total Protein: 6.5 g/dL (ref 6.5–8.1)

## 2021-07-10 LAB — CBC WITH DIFFERENTIAL/PLATELET
Abs Immature Granulocytes: 0.03 10*3/uL (ref 0.00–0.07)
Basophils Absolute: 0 10*3/uL (ref 0.0–0.1)
Basophils Relative: 0 %
Eosinophils Absolute: 0.1 10*3/uL (ref 0.0–0.5)
Eosinophils Relative: 1 %
HCT: 33.4 % — ABNORMAL LOW (ref 36.0–46.0)
Hemoglobin: 10.1 g/dL — ABNORMAL LOW (ref 12.0–15.0)
Immature Granulocytes: 0 %
Lymphocytes Relative: 7 %
Lymphs Abs: 0.6 10*3/uL — ABNORMAL LOW (ref 0.7–4.0)
MCH: 29.4 pg (ref 26.0–34.0)
MCHC: 30.2 g/dL (ref 30.0–36.0)
MCV: 97.1 fL (ref 80.0–100.0)
Monocytes Absolute: 0.5 10*3/uL (ref 0.1–1.0)
Monocytes Relative: 6 %
Neutro Abs: 7.2 10*3/uL (ref 1.7–7.7)
Neutrophils Relative %: 86 %
Platelets: 207 10*3/uL (ref 150–400)
RBC: 3.44 MIL/uL — ABNORMAL LOW (ref 3.87–5.11)
RDW: 13.9 % (ref 11.5–15.5)
WBC: 8.4 10*3/uL (ref 4.0–10.5)
nRBC: 0 % (ref 0.0–0.2)

## 2021-07-10 LAB — TROPONIN I (HIGH SENSITIVITY)
Troponin I (High Sensitivity): 25 ng/L — ABNORMAL HIGH (ref <18)
Troponin I (High Sensitivity): 34 ng/L — ABNORMAL HIGH (ref <18)

## 2021-07-10 LAB — CBG MONITORING, ED
Glucose-Capillary: 94 mg/dL (ref 70–99)
Glucose-Capillary: 180 mg/dL — ABNORMAL HIGH (ref 70–99)

## 2021-07-10 LAB — D-DIMER, QUANTITATIVE: D-Dimer, Quant: 1.32 ug/mL-FEU — ABNORMAL HIGH (ref 0.00–0.50)

## 2021-07-10 MED ORDER — IPRATROPIUM-ALBUTEROL 0.5-2.5 (3) MG/3ML IN SOLN
3.0000 mL | Freq: Once | RESPIRATORY_TRACT | Status: AC
  Administered 2021-07-10: 3 mL via RESPIRATORY_TRACT
  Filled 2021-07-10: qty 3

## 2021-07-10 NOTE — ED Notes (Signed)
Family on way to pick up pt. Family of pt bringing clothes for pt to wear to discharge home.

## 2021-07-10 NOTE — Progress Notes (Signed)
CARDIOLOGY CONSULT NOTE               [name] MRN: [id number] DOB/AGE: 24-Jan-1956 65 y.o.  Admit date: 07/08/2021 Referring [facility] hospitalist Primary Physician Dr. Clayborn Bigness Primary Cardiologist  Reason for Consultation symptomatic bradycardia  HPI: Patient states to be doing reasonably well initially presented with vertigo lightheadedness thought to be related to symptomatic bradycardia.  Coreg was held heart rate improved from the 40s now up to 80 patient should be well enough to go home denies any significant symptoms.  Patient is being treated for lung cancer denies shortness of breath no palpitations or tachycardia  Review of systems complete and found to be negative unless listed above     Past Medical History:  Diagnosis Date   Anemia    Arthritis    Asthma    Atherosclerosis of native arteries of extremity with intermittent claudication ([place]) 05/26/2016   Cancer ([place]) 2012   Right Lung CA   COPD (chronic obstructive pulmonary disease) ([place])    Depression    Diabetes mellitus without complication ([place])    Patient takes Janumet   Essential hypertension 05/26/2016   Hypercholesteremia    Oxygen dependent    2L at nite    PAD (peripheral artery disease) ([place]) 06/22/2016   Peripheral vascular disease (H[facility])    Personal history of radiation therapy    Shortness of breath dyspnea    with exertion    Sialolithiasis    Sleep apnea    Wears dentures    full upper and lower    Past Surgical History:  Procedure Laterality Date   CESAREAN SECTION     x3   COLONOSCOPY WITH PROPOFOL N/A 06/25/2015   Procedure: COLONOSCOPY WITH PROPOFOL;  Surgeon: Lucilla Lame, MD;  Location: [facility];  Service: Endoscopy;  Laterality: N/A;   COLONOSCOPY WITH PROPOFOL N/A 07/26/2020   Procedure: COLONOSCOPY WITH PROPOFOL;  Surgeon: Lucilla Lame, MD;  Location: [place];  Service: Endoscopy;  Laterality: N/A;   CYST REMOVAL LEG     and  on shoulder    ESOPHAGOGASTRODUODENOSCOPY (EGD) WITH PROPOFOL N/A 07/26/2020   Procedure: ESOPHAGOGASTRODUODENOSCOPY (EGD) WITH PROPOFOL;  Surgeon: Lucilla Lame, MD;  Location: [place];  Service: Endoscopy;  Laterality: N/A;  Diabetic - oral meds   LOWER EXTREMITY ANGIOGRAPHY Left 09/29/2018   Procedure: LOWER EXTREMITY ANGIOGRAPHY;  Surgeon: Algernon Huxley, MD;  Location: [place] [facility];  Service: Cardiovascular;  Laterality: Left;   LUNG BIOPSY  05 15 2013   has lung "spots"   PERIPHERAL VASCULAR CATHETERIZATION Left 06/01/2016   Procedure: Lower Extremity Angiography;  Surgeon: Algernon Huxley, MD;  Location: [place] [facility];  Service: Cardiovascular;  Laterality: Left;   PERIPHERAL VASCULAR CATHETERIZATION N/A 06/01/2016   Procedure: Abdominal Aortogram w/Lower Extremity;  Surgeon: Algernon Huxley, MD;  Location: [facility] [facility];  Service: Cardiovascular;  Laterality: N/A;   PERIPHERAL VASCULAR CATHETERIZATION  06/01/2016   Procedure: Lower Extremity Intervention;  Surgeon: Algernon Huxley, MD;  Location: [place] [facility];  Service: Cardiovascular;;   PERIPHERAL VASCULAR CATHETERIZATION Right 06/08/2016   Procedure: Lower Extremity Angiography;  Surgeon: Algernon Huxley, MD;  Location: [place] [facility];  Service: Cardiovascular;  Laterality: Right;   PERIPHERAL VASCULAR CATHETERIZATION  06/08/2016   Procedure: Lower Extremity Intervention;  Surgeon: Algernon Huxley, MD;  Location: [place] [facility];  Service: Cardiovascular;;   SUBMANDIBULAR GLAND EXCISION Left 12/06/2020  Procedure: EXCISION SUBMANDIBULAR GLAND;  Surgeon: Beverly Gust, MD;  Location: [place];  Service: ENT;  Laterality: Left;  needs to be first case Diabetic - diet controlled    (Not in a hospital admission)  Social History   Socioeconomic History   Marital status: Widowed    Spouse name: Not on file   Number of children: Not on file   Years of education: Not on file   Highest  education level: Not on file  Occupational History   Not on file  Tobacco Use   Smoking status: Former    Packs/day: 1.00    Years: 37.00    Pack years: 37.00    Types: Cigarettes    Quit date: 02/06/2010    Years since quitting: 11.4   Smokeless tobacco: Former    Types: Snuff  Vaping Use   Vaping Use: Never used  Substance and Sexual Activity   Alcohol use: Not Currently    Alcohol/week: 5.0 standard drinks    Types: 5 Cans of beer per week    Comment: /h x of alcohol abuse -stopped 2012- now drinks 5 beer per week   Drug use: Not Currently    Types: Marijuana, "Crack" cocaine, Cocaine    Comment: hx of cocaine use- last use 2015; last use marijuana6/22/19,   Sexual activity: Yes  Other Topics Concern   Not on file  Social History Narrative   Lives with Significant Other x 43 years   Social Determinants of Health   Financial Resource Strain: Not on file  Food Insecurity: Not on file  Transportation Needs: Not on file  Physical Activity: Not on file  Stress: Not on file  Social Connections: Not on file  Intimate Partner Violence: Not on file    Family History  Problem Relation Age of Onset   Lung cancer Father    Diabetes Mother    Hypercholesterolemia Mother    Diabetes Sister    Diabetes Maternal Grandmother    Diabetes Paternal Grandmother    Diabetes Sister    Heart attack Brother    Coronary artery disease Brother    Vascular Disease Brother    Hypertension Sister    Heart attack Brother       Review of systems complete and found to be negative unless listed above      PHYSICAL EXAM  General: Well developed, well nourished, in no acute distress HEENT:  Normocephalic and atramatic Neck:  No JVD.  Lungs: Clear bilaterally to auscultation and percussion. Heart: HRRR . Normal S1 and S2 without gallops or murmurs.  Abdomen: Bowel sounds are positive, abdomen soft and non-tender  Msk:  Back normal, normal gait. Normal strength and tone for  age. Extremities: No clubbing, cyanosis or edema.   Neuro: Alert and oriented X 3. Psych:  Good affect, responds appropriately  Labs:   Lab Results  Component Value Date   WBC 4.2 07/09/2021   HGB 9.5 (L) 07/09/2021   HCT 31.0 (L) 07/09/2021   MCV 95.7 07/09/2021   PLT 219 07/09/2021    Recent Labs  Lab 07/08/21 2132 07/09/21 0722  NA 135 140  K 3.7 3.9  CL 102 106  CO2 27 30  BUN 23 14  CREATININE 0.52 0.52  CALCIUM 8.7* 8.2*  PROT 6.8  --   BILITOT 0.6  --   ALKPHOS 80  --   ALT 134*  --   AST 150*  --   GLUCOSE 163* 172*   No  results found for: CKTOTAL, CKMB, CKMBINDEX, TROPONINI  Lab Results  Component Value Date   CHOL 160 03/28/2020   CHOL 218 (H) 06/22/2018   Lab Results  Component Value Date   HDL 66 03/28/2020   HDL 66 06/22/2018   Lab Results  Component Value Date   LDLCALC 81 03/28/2020   LDLCALC 115 (H) 06/22/2018   Lab Results  Component Value Date   TRIG 68 03/28/2020   TRIG 183 (H) 06/22/2018   No results found for: CHOLHDL No results found for: LDLDIRECT    Radiology: DG Chest 2 View  Result Date: 06/26/2021 CLINICAL DATA:  Shortness of breath EXAM: CHEST - 2 VIEW COMPARISON:  06/11/2021 FINDINGS: Background emphysema. Persistent but improved opacities at the right greater than left lung bases. No significant pleural effusion. No pneumothorax. Stable cardiomediastinal contours with normal heart size. IMPRESSION: Persistent but improved pneumonia at the right greater than left lung bases. Electronically Signed   By: Macy Mis M.D.   On: 06/26/2021 13:24   CT Angio Chest Pulmonary Embolism (PE) W or WO Contrast  Result Date: 06/26/2021 CLINICAL DATA:  Difficulty breathing, COPD EXAM: CT ANGIOGRAPHY CHEST WITH CONTRAST TECHNIQUE: Multidetector CT imaging of the chest was performed using the standard protocol during bolus administration of intravenous contrast. Multiplanar CT image reconstructions and MIPs were obtained to evaluate the  vascular anatomy. CONTRAST:  54mL OMNIPAQUE IOHEXOL 350 MG/ML SOLN COMPARISON:  05/01/2021 FINDINGS: Cardiovascular: There is homogeneous enhancement in thoracic aorta. There are no intraluminal filling defects in the central pulmonary artery branches. There is slightly inhomogeneous attenuation in the small peripheral branches in the both lower lobes. In the image 59 of series 4, there is questionable exophytic low-density in the anterior margin of 1 of the segmental branches in the left lower lobe which could not be distinctly seen in the adjacent images suggesting possible artifact. Evaluation is limited by motion artifacts in both lungs and infiltrates/scarring in the right lung. Mediastinum/Nodes: No new significant lymphadenopathy seen Lungs/Pleura: Marked bronchospasm seen in the previous study is not evident in the current study. Centrilobular and panlobular emphysema is seen in both lungs. There is small patchy infiltrate in lateral aspect of right apex with no significant change. There is focal alveolar density in the posterior segment of right upper lobe with no significant change. There are patchy infiltrates in the right lower lobe with no significant interval change. Increased interstitial markings are seen in the right lung with no significant change suggesting scarring. There are no new focal pulmonary infiltrates. There is no significant pleural effusion or pneumothorax. Upper Abdomen: There is fatty infiltration in the liver Musculoskeletal: Unremarkable Review of the MIP images confirms the above findings. IMPRESSION: There is no evidence central pulmonary artery embolism. Evaluation of small peripheral pulmonary artery branches is limited by motion artifacts and infiltrates, especially in the lower lobes. There is no evidence of right ventricular strain. COPD. There are patchy infiltrates and fibrotic changes in the right lung with no significant interval change. There is no pleural effusion or  pneumothorax. Electronically Signed   By: Elmer Picker M.D.   On: 06/26/2021 16:51   DG Chest Portable 1 View  Result Date: 06/11/2021 CLINICAL DATA:  Shortness of breath, wheezing. EXAM: PORTABLE CHEST 1 VIEW COMPARISON:  Chest radiograph 05/06/2021; CT chest 05/01/2021 FINDINGS: Upper lobe predominant emphysematous changes with reticulation at the lung bases. Interstitial and patchy airspace opacities in the mid and lower lungs, right greater than left. No pleural effusion or  pneumothorax. Scarring at the right apex. Heart is normal in size. No acute osseous abnormality. IMPRESSION: Interstitial and patchy airspace opacities in the mid and lower lungs, right-greater-than-left. Findings are concerning for multifocal pneumonia in the appropriate clinical context. Emphysema (ICD10-J43.9). Electronically Signed   By: Ileana Roup M.D.   On: 06/11/2021 14:20    EKG: Sinus rhythm rate of 80 bundle branch block  ASSESSMENT AND PLAN:  Symptomatic bradycardia Lung cancer Abnormal EKG Transient vertigo . Plan Patient significantly improved heart rate up to 80 continue to hold Coreg Hypertension avoid AV nodal blocking drugs consider amlodipine ARB or ACE inhibitor and statin No indication for permanent pacemaker Symptoms have completely resolved recommend outpatient follow-up with cardiology Cardiology will sign off now  Signed: Yolonda Kida MD 07/10/2021, 10:11 AM

## 2021-07-10 NOTE — ED Provider Notes (Signed)
[facility]  ____________________________________________   Event Date/Time   First MD Initiated Contact with Patient 07/10/21 1958     (approximate)  I have reviewed the triage vital signs and the nursing notes.   HISTORY  Chief Complaint Shortness of Breath    HPI Angela Jensen is a 65 y.o. female with past medical history of COPD on 3 L nasal cannula baseline, history of cancer of the right lung in remission, diabetes who presents with shortness of breath.  Of note patient was just recently admitted to the hospital for syncope and bradycardia and was discharged at 7 AM this morning.  She was doing well at home and then suddenly had acute onset of shortness of breath.  She denies associated chest pain or cough.  No fevers or chills.  Denies lower extremity edema.  During her admission the patient's carvedilol was held and she was ultimately stable for discharge.         Past Medical History:  Diagnosis Date   Anemia    Arthritis    Asthma    Atherosclerosis of native arteries of extremity with intermittent claudication ([place]) 05/26/2016   Cancer ([place]) 2012   Right Lung CA   COPD (chronic obstructive pulmonary disease) ([place])    Depression    Diabetes mellitus without complication ([place])    Patient takes Janumet   Essential hypertension 05/26/2016   Hypercholesteremia    Oxygen dependent    2L at nite    PAD (peripheral artery disease) ([place]) 06/22/2016   Peripheral vascular disease ([place])    Personal history of radiation therapy    Shortness of breath dyspnea    with exertion    Sialolithiasis    Sleep apnea    Wears dentures    full upper and lower    Patient Active Problem List   Diagnosis Date Noted   Symptomatic bradycardia 07/09/2021   Chronic respiratory failure with hypoxia (HCC)    CAP (community acquired pneumonia) 06/26/2021   COPD exacerbation ([place]) 06/11/2021   SOB (shortness of breath) 05/01/2021   HCAP (healthcare-associated  pneumonia) 05/01/2021   Chronic combined systolic and diastolic CHF (congestive heart failure) ([place]) 05/01/2021   Acute exacerbation of chronic obstructive pulmonary disease (COPD) ([place]) 04/30/2021   Elevated troponin 04/01/2021   Acute on chronic respiratory failure with hypoxia ([place]) 03/31/2021   Pneumothorax after biopsy 05/03/2020   Pneumothorax on right 05/01/2020   Chest pain on breathing 04/21/2020   Pleuritic chest pain 04/11/2020   Right lower lobe pulmonary nodule 04/02/2020   Iron deficiency anemia 04/02/2020   Pica in adults 04/02/2020   Goals of care, counseling/discussion 03/14/2020   Pain in left wrist 09/24/2019   Encounter for general adult medical examination with abnormal findings 06/28/2019   Elevated total protein 01/22/2019   Uncontrolled type 2 diabetes mellitus with hyperglycemia ([place]) 05/16/2018   Chronic obstructive pulmonary disease ([place]) 05/16/2018   Vitamin D deficiency 05/16/2018   Flu vaccine need 05/16/2018   Dysuria 05/16/2018   Cancer of lower lobe of right lung ([place]) 01/21/2018   Screening for breast cancer 10/12/2017   Personal history of tobacco use, presenting hazards to health 02/03/2017   PAD (peripheral artery disease) ([place]) 06/22/2016   Essential hypertension 05/26/2016   Diabetes ([place]) 05/26/2016   Hyperlipidemia 05/26/2016   Atherosclerosis of native arteries of extremity with intermittent claudication ([place]) 05/26/2016   Uterine leiomyoma 04/30/2016   Elevated CEA 01/31/2016   Special screening for malignant neoplasms, colon  Benign neoplasm of sigmoid colon    Primary lung cancer ([place])    Malignant neoplasm of upper lobe of right lung [facility])     Past Surgical History:  Procedure Laterality Date   CESAREAN SECTION     x3   COLONOSCOPY WITH PROPOFOL N/A 06/25/2015   Procedure: COLONOSCOPY WITH PROPOFOL;  Surgeon: Lucilla Lame, MD;  Location: [facility] ENDOSCOPY;  Service: Endoscopy;  Laterality: N/A;   COLONOSCOPY WITH PROPOFOL N/A  07/26/2020   Procedure: COLONOSCOPY WITH PROPOFOL;  Surgeon: Lucilla Lame, MD;  Location: [place];  Service: Endoscopy;  Laterality: N/A;   CYST REMOVAL LEG     and on shoulder    ESOPHAGOGASTRODUODENOSCOPY (EGD) WITH PROPOFOL N/A 07/26/2020   Procedure: ESOPHAGOGASTRODUODENOSCOPY (EGD) WITH PROPOFOL;  Surgeon: Lucilla Lame, MD;  Location: [place];  Service: Endoscopy;  Laterality: N/A;  Diabetic - oral meds   LOWER EXTREMITY ANGIOGRAPHY Left 09/29/2018   Procedure: LOWER EXTREMITY ANGIOGRAPHY;  Surgeon: Algernon Huxley, MD;  Location: [place] [facility];  Service: Cardiovascular;  Laterality: Left;   LUNG BIOPSY  05 15 2013   has lung "spots"   PERIPHERAL VASCULAR CATHETERIZATION Left 06/01/2016   Procedure: Lower Extremity Angiography;  Surgeon: Algernon Huxley, MD;  Location: [place] [facility];  Service: Cardiovascular;  Laterality: Left;   PERIPHERAL VASCULAR CATHETERIZATION N/A 06/01/2016   Procedure: Abdominal Aortogram w/Lower Extremity;  Surgeon: Algernon Huxley, MD;  Location: [place] [facility];  Service: Cardiovascular;  Laterality: N/A;   PERIPHERAL VASCULAR CATHETERIZATION  06/01/2016   Procedure: Lower Extremity Intervention;  Surgeon: Algernon Huxley, MD;  Location: [place] [facility];  Service: Cardiovascular;;   PERIPHERAL VASCULAR CATHETERIZATION Right 06/08/2016   Procedure: Lower Extremity Angiography;  Surgeon: Algernon Huxley, MD;  Location: [place] [facility];  Service: Cardiovascular;  Laterality: Right;   PERIPHERAL VASCULAR CATHETERIZATION  06/08/2016   Procedure: Lower Extremity Intervention;  Surgeon: Algernon Huxley, MD;  Location: [place] [facility];  Service: Cardiovascular;;   SUBMANDIBULAR GLAND EXCISION Left 12/06/2020   Procedure: EXCISION SUBMANDIBULAR GLAND;  Surgeon: Beverly Gust, MD;  Location: [place];  Service: ENT;  Laterality: Left;  needs to be first case Diabetic - diet controlled    Prior to Admission medications    Medication Sig Start Date End Date Taking? Authorizing Provider  Accu-Chek Softclix Lancets lancets Use as instructed for twice a daily Dx E11.65 04/11/21   [name], MD  albuterol (PROVENTIL) (2.5 MG/3ML) 0.083% nebulizer solution USE 1 VIAL IN NEBULIZER EVERY 6 HOURS AS NEEDED FOR WHEEZING 06/30/21   [name], MD  albuterol (VENTOLIN HFA) 108 (90 Base) MCG/ACT inhaler INHALE 2 PUFFS BY MOUTH EVERY 6 HOURS AS NEEDED FOR WHEEZING FOR SHORTNESS OF BREATH 06/26/21   [name], MD  ALPRAZolam Duanne Moron) 0.25 MG tablet Take 1 tablet (0.25 mg total) by mouth 3 (three) times daily as needed for anxiety or sleep. 06/17/21   Jonetta Osgood, NP  Blood Glucose Monitoring Suppl (ACCU-CHEK GUIDE) w/Device KIT Use as directed Dx e11.65 04/11/21   [name], MD  clopidogrel (PLAVIX) 75 MG tablet Take 1 tablet by mouth once daily 06/22/21   Jonetta Osgood, NP  escitalopram (LEXAPRO) 10 MG tablet Take 1 tablet (10 mg total) by mouth daily. 05/08/21   Ezekiel Slocumb, DO  famotidine (PEPCID) 20 MG tablet Take 1 tablet (20 mg total) by mouth daily for 14 days. 06/14/21 06/28/21  Nolberto Hanlon, MD  ferrous sulfate 325 (65 FE) MG tablet Take 325 mg by mouth 2 (two) times daily with a meal.    [provider]  fluticasone (FLONASE) 50 MCG/ACT nasal spray Place 2 sprays into both nostrils daily. 05/09/21   Ezekiel Slocumb, DO  gabapentin (NEURONTIN) 100 MG capsule Take 1 capsule (100 mg total) by mouth 2 (two) times daily. 04/08/21   Jonetta Osgood, NP  glucose blood (ACCU-CHEK GUIDE) test strip USE  STRIP TO CHECK GLUCOSE TWICE DAILY 06/08/21   [name], MD  insulin detemir (LEVEMIR FLEXTOUCH) 100 UNIT/ML FlexPen Inject 8- 12 units with supper qd// with needles 04/22/21   [name], MD  lisinopril (ZESTRIL) 10 MG tablet Take 1 tablet (10 mg total) by mouth daily. 05/09/21   Ezekiel Slocumb, DO  metFORMIN (GLUCOPHAGE) 500 MG tablet Take 1 tablet (500 mg total) by mouth daily with  breakfast. 03/12/21   [name], MD  mometasone-formoterol [facility]) 200-5 MCG/ACT AERO Inhale 2 puffs into the lungs 2 (two) times daily. 06/17/21   Jonetta Osgood, NP  Omega-3 Fatty Acids (FISH OIL) 1000 MG CAPS Take 1 capsule by mouth daily.    [provider]  OXYGEN Inhale into the lungs.    [provider]  potassium chloride (KLOR-CON) 10 MEQ tablet TAKE 1 TABLET BY MOUTH EVERY OTHER DAY 04/29/21   [name], MD  tiotropium (SPIRIVA HANDIHALER) 18 MCG inhalation capsule INHALE THE CONTENTS OF ONE CAPSULE BY MOUTH ONCE DAILY. DO NOT SWALLOW CAPSULE. 06/17/21   Jonetta Osgood, NP    Allergies Trelegy ellipta [fluticasone-umeclidin-vilant]  Family History  Problem Relation Age of Onset   Lung cancer Father    Diabetes Mother    Hypercholesterolemia Mother    Diabetes Sister    Diabetes Maternal Grandmother    Diabetes Paternal Grandmother    Diabetes Sister    Heart attack Brother    Coronary artery disease Brother    Vascular Disease Brother    Hypertension Sister    Heart attack Brother     Social History Social History   Tobacco Use   Smoking status: Former    Packs/day: 1.00    Years: 37.00    Pack years: 37.00    Types: Cigarettes    Quit date: 02/06/2010    Years since quitting: 11.4   Smokeless tobacco: Former    Types: Snuff  Vaping Use   Vaping Use: Never used  Substance Use Topics   Alcohol use: Not Currently    Alcohol/week: 5.0 standard drinks    Types: 5 Cans of beer per week    Comment: /h x of alcohol abuse -stopped 2012- now drinks 5 beer per week   Drug use: Not Currently    Types: Marijuana, "Crack" cocaine, Cocaine    Comment: hx of cocaine use- last use 2015; last use marijuana6/22/19,    Review of Systems   Review of Systems  Constitutional:  Negative for appetite change, chills and fever.  Respiratory:  Positive for shortness of breath. Negative for cough and chest tightness.   Cardiovascular:  Negative for  chest pain.  Gastrointestinal:  Negative for abdominal pain, nausea and vomiting.  All other systems reviewed and are negative.  Physical Exam Updated Vital Signs BP 133/77   Pulse (!) 107   Temp (!) 97.1 F (36.2 C) (Oral)   Resp 17   Ht '4\' 11"'  (1.499 m)   Wt 58.2 kg   SpO2 97%   BMI  25.93 kg/m   Physical Exam Vitals and nursing note reviewed.  Constitutional:      General: She is not in acute distress.    Appearance: Normal appearance.  HENT:     Head: Normocephalic and atraumatic.  Eyes:     General: No scleral icterus.    Conjunctiva/sclera: Conjunctivae normal.  Cardiovascular:     Rate and Rhythm: Regular rhythm. Tachycardia present.  Pulmonary:     Effort: Pulmonary effort is normal. Tachypnea present. No respiratory distress.     Breath sounds: No stridor. Wheezing present.  Abdominal:     Palpations: Abdomen is soft. There is no mass.     Tenderness: There is no guarding.  Musculoskeletal:        General: No deformity or signs of injury.     Cervical back: Normal range of motion.     Right lower leg: No edema.     Left lower leg: No edema.  Skin:    General: Skin is dry.     Coloration: Skin is not jaundiced or pale.  Neurological:     General: No focal deficit present.     Mental Status: She is alert and oriented to person, place, and time. Mental status is at baseline.  Psychiatric:        Mood and Affect: Mood normal.        Behavior: Behavior normal.     LABS (all labs ordered are listed, but only abnormal results are displayed)  Labs Reviewed  COMPREHENSIVE METABOLIC PANEL - Abnormal; Notable for the following components:      Result Value   Glucose, Bld 319 (*)    Calcium 8.2 (*)    Albumin 3.4 (*)    AST 57 (*)    ALT 81 (*)    All other components within normal limits  CBC WITH DIFFERENTIAL/PLATELET - Abnormal; Notable for the following components:   RBC 3.44 (*)    Hemoglobin 10.1 (*)    HCT 33.4 (*)    Lymphs Abs 0.6 (*)    All  other components within normal limits  TROPONIN I (HIGH SENSITIVITY) - Abnormal; Notable for the following components:   Troponin I (High Sensitivity) 25 (*)    All other components within normal limits  D-DIMER, QUANTITATIVE  TROPONIN I (HIGH SENSITIVITY)   ____________________________________________  EKG  Left bundle branch block normal sinus rhythm no Sgarbossa criteria to suggest ischemia ____________________________________________  RADIOLOGY I, Madelin Headings, personally viewed and evaluated these images (plain radiographs) as part of my medical decision making, as well as reviewing the written report by the radiologist.  ED MD interpretation:  I reviewed the CXR which has some patchy opacities in the right lung base which is improved from prior     ____________________________________________   PROCEDURES  Procedure(s) performed (including Critical Care):  Procedures   ____________________________________________   INITIAL IMPRESSION / ASSESSMENT AND PLAN / ED COURSE   Patient is a 65 year old female presenting with acute onset of shortness of breath.  Patient was just discharged from the hospital for an unrelated admission for bradycardia and syncope during which her carvedilol was held.  Now presenting with shortness of breath.  She was apparently hypoxic on her baseline 3 L for EMS and was given DuoNeb and 125 mg of Solu-Medrol.  On arrival to the ED patient looking overall well but does have wheezing throughout.  She is notably tachycardic, EKG shows sinus tachycardia with a left bundle branch block.  We will  treat as a COPD exacerbation with duo nebs and reassess.  Chest x-ray shows some patchiness in the right side which seems to be stable from her prior chest x-rays.  Her troponin is 25, rest of her labs are reassuring.  On reassessment patient looks much more comfortable and wheezing is improved.  She is persistently tachycardic.  When sitting up in bed heart  rate goes to the 120s.  Unclear if this is related to the fact that her carvedilol is held or related to underlying process.  We will send a D-dimer.  Patient also pending a repeat troponin.  At the time of signout she is pending a D-dimer and repeat troponin.  We will proceed to CTA if dimer is positive.  If work-up is otherwise reassuring and patient feels back to baseline can consider discharge with steroids.       ____________________________________________   FINAL CLINICAL IMPRESSION(S) / ED DIAGNOSES  Final diagnoses:  COPD exacerbation [facility])     ED Discharge Orders     None        Note:  This document was prepared using Dragon voice recognition software and may include unintentional dictation errors.    Rada Hay, MD 07/10/21 2256

## 2021-07-10 NOTE — Discharge Summary (Signed)
Physician Discharge Summary  Angela Jensen FFM:[id number] DOB: 1955/10/14 DOA: 07/08/2021  PCP: [name], MD  Admit date: 07/08/2021 Discharge date: 07/10/2021  Admitted From: Home Disposition: Home  Recommendations for Outpatient Follow-up:  Follow up with cardiology within 1-2 weeks for blood pressure check.  Discharge Condition:stable CODE STATUS:  Code Status: Full Code  Regular healthy diet  Brief/Interim Summary: Pt presented with symptomatic bradycardia of pre-syncope. She remained hemodynamically stable throughout her observation. Her carvedilol was held and her HR returned to normal rate overnight. She had no more symptoms since admission. Cardiology evaluated and did not recommend further intervention at this time and did not recommend pacemaker. Cardiology recommended avoiding AV nodal blocking agents for BP control such as amlodipine, ARB if needed.   Discharge Diagnoses:  Principal Problem:   Symptomatic bradycardia  Discharge Instructions     Ambulatory referral to Cardiology   Complete by: As directed    [facility]   Discharge patient   Complete by: As directed    Discharge disposition: 01-Home or Self Care   Discharge patient date: 07/10/2021      Allergies as of 07/10/2021       Reactions   Trelegy Ellipta [fluticasone-umeclidin-vilant]         Medication List     STOP taking these medications    carvedilol 3.125 MG tablet Commonly known as: COREG   meloxicam 7.5 MG tablet Commonly known as: MOBIC   predniSONE 20 MG tablet Commonly known as: DELTASONE       TAKE these medications    Accu-Chek Guide test strip Generic drug: glucose blood USE  STRIP TO CHECK GLUCOSE TWICE DAILY   Accu-Chek Guide w/Device Kit Use as directed Dx e11.65   Accu-Chek Softclix Lancets lancets Use as instructed for twice a daily Dx E11.65   albuterol 108 (90 Base) MCG/ACT inhaler Commonly known as: VENTOLIN HFA INHALE 2 PUFFS BY MOUTH EVERY 6  HOURS AS NEEDED FOR WHEEZING FOR SHORTNESS OF BREATH   albuterol (2.5 MG/3ML) 0.083% nebulizer solution Commonly known as: PROVENTIL USE 1 VIAL IN NEBULIZER EVERY 6 HOURS AS NEEDED FOR WHEEZING   ALPRAZolam 0.25 MG tablet Commonly known as: XANAX Take 1 tablet (0.25 mg total) by mouth 3 (three) times daily as needed for anxiety or sleep.   clopidogrel 75 MG tablet Commonly known as: PLAVIX Take 1 tablet by mouth once daily   escitalopram 10 MG tablet Commonly known as: LEXAPRO Take 1 tablet (10 mg total) by mouth daily.   famotidine 20 MG tablet Commonly known as: PEPCID Take 1 tablet (20 mg total) by mouth daily for 14 days.   ferrous sulfate 325 (65 FE) MG tablet Take 325 mg by mouth 2 (two) times daily with a meal.   Fish Oil 1000 MG Caps Take 1 capsule by mouth daily.   fluticasone 50 MCG/ACT nasal spray Commonly known as: FLONASE Place 2 sprays into both nostrils daily.   gabapentin 100 MG capsule Commonly known as: NEURONTIN Take 1 capsule (100 mg total) by mouth 2 (two) times daily.   Levemir FlexTouch 100 UNIT/ML FlexPen Generic drug: insulin detemir Inject 8- 12 units with supper qd// with needles   lisinopril 10 MG tablet Commonly known as: ZESTRIL Take 1 tablet (10 mg total) by mouth daily.   metFORMIN 500 MG tablet Commonly known as: GLUCOPHAGE Take 1 tablet (500 mg total) by mouth daily with breakfast.   mometasone-formoterol 200-5 MCG/ACT Aero Commonly known as: DULERA Inhale 2 puffs into  the lungs 2 (two) times daily.   OXYGEN Inhale into the lungs.   potassium chloride 10 MEQ tablet Commonly known as: KLOR-CON TAKE 1 TABLET BY MOUTH EVERY OTHER DAY   Spiriva HandiHaler 18 MCG inhalation capsule Generic drug: tiotropium INHALE THE CONTENTS OF ONE CAPSULE BY MOUTH ONCE DAILY. DO NOT SWALLOW CAPSULE.        Allergies  Allergen Reactions   Trelegy Ellipta [Fluticasone-Umeclidin-Vilant]      Consultations: cardiology  Procedures/Studies: DG Chest 2 View  Result Date: 06/26/2021 CLINICAL DATA:  Shortness of breath EXAM: CHEST - 2 VIEW COMPARISON:  06/11/2021 FINDINGS: Background emphysema. Persistent but improved opacities at the right greater than left lung bases. No significant pleural effusion. No pneumothorax. Stable cardiomediastinal contours with normal heart size. IMPRESSION: Persistent but improved pneumonia at the right greater than left lung bases. Electronically Signed   By: Macy Mis M.D.   On: 06/26/2021 13:24   CT Angio Chest Pulmonary Embolism (PE) W or WO Contrast  Result Date: 06/26/2021 CLINICAL DATA:  Difficulty breathing, COPD EXAM: CT ANGIOGRAPHY CHEST WITH CONTRAST TECHNIQUE: Multidetector CT imaging of the chest was performed using the standard protocol during bolus administration of intravenous contrast. Multiplanar CT image reconstructions and MIPs were obtained to evaluate the vascular anatomy. CONTRAST:  51m OMNIPAQUE IOHEXOL 350 MG/ML SOLN COMPARISON:  05/01/2021 FINDINGS: Cardiovascular: There is homogeneous enhancement in thoracic aorta. There are no intraluminal filling defects in the central pulmonary artery branches. There is slightly inhomogeneous attenuation in the small peripheral branches in the both lower lobes. In the image 59 of series 4, there is questionable exophytic low-density in the anterior margin of 1 of the segmental branches in the left lower lobe which could not be distinctly seen in the adjacent images suggesting possible artifact. Evaluation is limited by motion artifacts in both lungs and infiltrates/scarring in the right lung. Mediastinum/Nodes: No new significant lymphadenopathy seen Lungs/Pleura: Marked bronchospasm seen in the previous study is not evident in the current study. Centrilobular and panlobular emphysema is seen in both lungs. There is small patchy infiltrate in lateral aspect of right apex with no significant  change. There is focal alveolar density in the posterior segment of right upper lobe with no significant change. There are patchy infiltrates in the right lower lobe with no significant interval change. Increased interstitial markings are seen in the right lung with no significant change suggesting scarring. There are no new focal pulmonary infiltrates. There is no significant pleural effusion or pneumothorax. Upper Abdomen: There is fatty infiltration in the liver Musculoskeletal: Unremarkable Review of the MIP images confirms the above findings. IMPRESSION: There is no evidence central pulmonary artery embolism. Evaluation of small peripheral pulmonary artery branches is limited by motion artifacts and infiltrates, especially in the lower lobes. There is no evidence of right ventricular strain. COPD. There are patchy infiltrates and fibrotic changes in the right lung with no significant interval change. There is no pleural effusion or pneumothorax. Electronically Signed   By: PElmer PickerM.D.   On: 06/26/2021 16:51   DG Chest Portable 1 View  Result Date: 06/11/2021 CLINICAL DATA:  Shortness of breath, wheezing. EXAM: PORTABLE CHEST 1 VIEW COMPARISON:  Chest radiograph 05/06/2021; CT chest 05/01/2021 FINDINGS: Upper lobe predominant emphysematous changes with reticulation at the lung bases. Interstitial and patchy airspace opacities in the mid and lower lungs, right greater than left. No pleural effusion or pneumothorax. Scarring at the right apex. Heart is normal in size. No acute osseous abnormality. IMPRESSION:  Interstitial and patchy airspace opacities in the mid and lower lungs, right-greater-than-left. Findings are concerning for multifocal pneumonia in the appropriate clinical context. Emphysema (ICD10-J43.9). Electronically Signed   By: Ileana Roup M.D.   On: 06/11/2021 14:20    Subjective: Patient is asymptomatic. Denies CP, SOB, pre-syncope, lightheadedness with standing. She would like  to go home.   Discharge Exam: Vitals:   07/10/21 1300 07/10/21 1400  BP: (!) 161/88 (!) 164/92  Pulse: 85 79  Resp: 17 20  Temp:    SpO2: 100% 100%    General: Pt is alert, awake, not in acute distress Cardiovascular: RRR, S1/S2 +, no rubs, no gallops Respiratory: CTA bilaterally, no wheezing, no rhonchi Abdominal: Soft, NT, ND, bowel sounds + Extremities: no edema, no cyanosis  Labs: Basic Metabolic Panel: Recent Labs  Lab 07/08/21 2132 07/09/21 0722  NA 135 140  K 3.7 3.9  CL 102 106  CO2 27 30  GLUCOSE 163* 172*  BUN 23 14  CREATININE 0.52 0.52  CALCIUM 8.7* 8.2*   CBC: Recent Labs  Lab 07/08/21 2132 07/09/21 0722  WBC 5.1 4.2  NEUTROABS 3.5  --   HGB 10.5* 9.5*  HCT 33.6* 31.0*  MCV 94.9 95.7  PLT 236 219    Microbiology Recent Results (from the past 240 hour(s))  Resp Panel by RT-PCR (Flu A&B, Covid) Nasopharyngeal Swab     Status: None   Collection Time: 07/09/21  1:19 AM   Specimen: Nasopharyngeal Swab; Nasopharyngeal(NP) swabs in vial transport medium  Result Value Ref Range Status   SARS Coronavirus 2 by RT PCR NEGATIVE NEGATIVE Final    Comment: (NOTE) SARS-CoV-2 target nucleic acids are NOT DETECTED.  The SARS-CoV-2 RNA is generally detectable in upper respiratory specimens during the acute phase of infection. The lowest concentration of SARS-CoV-2 viral copies this assay can detect is 138 copies/mL. A negative result does not preclude SARS-Cov-2 infection and should not be used as the sole basis for treatment or other patient management decisions. A negative result may occur with  improper specimen collection/handling, submission of specimen other than nasopharyngeal swab, presence of viral mutation(s) within the areas targeted by this assay, and inadequate number of viral copies(<138 copies/mL). A negative result must be combined with clinical observations, patient history, and epidemiological information. The expected result is  Negative.  Fact Sheet for Patients:  EntrepreneurPulse.com.au  Fact Sheet for Healthcare Providers:  IncredibleEmployment.be  This test is no t yet approved or cleared by the [place] FDA and  has been authorized for detection and/or diagnosis of SARS-CoV-2 by FDA under an Emergency Use Authorization (EUA). This EUA will remain  in effect (meaning this test can be used) for the duration of the COVID-19 declaration under Section 564(b)(1) of the Act, 21 U.S.C.section 360bbb-3(b)(1), unless the authorization is terminated  or revoked sooner.       Influenza A by PCR NEGATIVE NEGATIVE Final   Influenza B by PCR NEGATIVE NEGATIVE Final    Comment: (NOTE) The Xpert Xpress SARS-CoV-2/FLU/RSV plus assay is intended as an aid in the diagnosis of influenza from Nasopharyngeal swab specimens and should not be used as a sole basis for treatment. Nasal washings and aspirates are unacceptable for Xpert Xpress SARS-CoV-2/FLU/RSV testing.  Fact Sheet for Patients: EntrepreneurPulse.com.au  Fact Sheet for Healthcare Providers: IncredibleEmployment.be  This test is not yet approved or cleared by the [place] FDA and has been authorized for detection and/or diagnosis of SARS-CoV-2 by FDA under an Emergency Use Authorization (EUA).  This EUA will remain in effect (meaning this test can be used) for the duration of the COVID-19 declaration under Section 564(b)(1) of the Act, 21 U.S.C. section 360bbb-3(b)(1), unless the authorization is terminated or revoked.  Performed at [facility], [address], [place], [place] [address]     Time coordinating discharge: Over 30 minutes  Richarda Osmond, MD  [facility] 07/10/2021, 6:43 PM Pager   If 7PM-7AM, please contact night-coverage [email or web address] Password [email or web address]

## 2021-07-10 NOTE — ED Notes (Signed)
Morning meds brought to bedside. Pt briefly woke up from sleeping; asked pt if she would take morning meds now; pt stated she prefers to sleep at this time.

## 2021-07-10 NOTE — ED Notes (Signed)
Report received from Ashley, RN 

## 2021-07-10 NOTE — ED Triage Notes (Signed)
Pt arrived via [facility] with reports of shortness of breath, pt was discharged from hospital about 3 hours prior to arrival, pt states when she left she was able to eat, but then after while became short of breath.  EMS gave solumedrol and duoneb enroute, pt on 3L [place] from home, initial sats 74% with 3L.  Pt reports improvement in sxs since treatment with EMS.

## 2021-07-10 NOTE — ED Notes (Signed)
CBG will be checked once glucometer available.

## 2021-07-10 NOTE — ED Notes (Signed)
Paper discharge consent signed by pt.

## 2021-07-10 NOTE — ED Notes (Signed)
CBG 180.  Pt requesting anti antianxiety med and tylenol for 5/10 HA.

## 2021-07-10 NOTE — ED Notes (Signed)
Verbal order received to discharge pt from Dr C. Anderson. This nurse unable to see discharge order but MD states order was put in this morning. Had CN look as well. Preparing to discharge pt home now.

## 2021-07-10 NOTE — ED Notes (Signed)
Family at bedside to dress pt.

## 2021-07-10 NOTE — ED Notes (Signed)
Pt eating breakfast 

## 2021-07-11 ENCOUNTER — Emergency Department: Payer: Medicare Other

## 2021-07-11 ENCOUNTER — Encounter: Payer: Self-pay | Admitting: Cardiology

## 2021-07-11 ENCOUNTER — Encounter
Admission: EM | Disposition: A | Discharge status: Home / Self Care | Payer: Self-pay | Source: Home / Self Care | Attending: Internal Medicine

## 2021-07-11 ENCOUNTER — Other Ambulatory Visit: Payer: Self-pay

## 2021-07-11 ENCOUNTER — Inpatient Hospital Stay: Payer: Medicare Other

## 2021-07-11 DIAGNOSIS — Z85118 Personal history of other malignant neoplasm of bronchus and lung: Secondary | ICD-10-CM | POA: Diagnosis not present

## 2021-07-11 DIAGNOSIS — E1165 Type 2 diabetes mellitus with hyperglycemia: Secondary | ICD-10-CM | POA: Diagnosis not present

## 2021-07-11 DIAGNOSIS — J9621 Acute and chronic respiratory failure with hypoxia: Secondary | ICD-10-CM | POA: Diagnosis present

## 2021-07-11 DIAGNOSIS — Z9981 Dependence on supplemental oxygen: Secondary | ICD-10-CM | POA: Diagnosis not present

## 2021-07-11 DIAGNOSIS — E1151 Type 2 diabetes mellitus with diabetic peripheral angiopathy without gangrene: Secondary | ICD-10-CM | POA: Diagnosis present

## 2021-07-11 DIAGNOSIS — Z923 Personal history of irradiation: Secondary | ICD-10-CM | POA: Diagnosis not present

## 2021-07-11 DIAGNOSIS — E11649 Type 2 diabetes mellitus with hypoglycemia without coma: Secondary | ICD-10-CM | POA: Diagnosis not present

## 2021-07-11 DIAGNOSIS — Y92239 Unspecified place in hospital as the place of occurrence of the external cause: Secondary | ICD-10-CM | POA: Diagnosis not present

## 2021-07-11 DIAGNOSIS — E872 Acidosis, unspecified: Secondary | ICD-10-CM | POA: Diagnosis present

## 2021-07-11 DIAGNOSIS — I447 Left bundle-branch block, unspecified: Secondary | ICD-10-CM | POA: Diagnosis present

## 2021-07-11 DIAGNOSIS — Z87891 Personal history of nicotine dependence: Secondary | ICD-10-CM | POA: Diagnosis not present

## 2021-07-11 DIAGNOSIS — Z83438 Family history of other disorder of lipoprotein metabolism and other lipidemia: Secondary | ICD-10-CM | POA: Diagnosis not present

## 2021-07-11 DIAGNOSIS — E1142 Type 2 diabetes mellitus with diabetic polyneuropathy: Secondary | ICD-10-CM | POA: Diagnosis present

## 2021-07-11 DIAGNOSIS — Z8249 Family history of ischemic heart disease and other diseases of the circulatory system: Secondary | ICD-10-CM | POA: Diagnosis not present

## 2021-07-11 DIAGNOSIS — Z801 Family history of malignant neoplasm of trachea, bronchus and lung: Secondary | ICD-10-CM | POA: Diagnosis not present

## 2021-07-11 DIAGNOSIS — I5042 Chronic combined systolic (congestive) and diastolic (congestive) heart failure: Secondary | ICD-10-CM | POA: Diagnosis present

## 2021-07-11 DIAGNOSIS — Z833 Family history of diabetes mellitus: Secondary | ICD-10-CM | POA: Diagnosis not present

## 2021-07-11 DIAGNOSIS — J431 Panlobular emphysema: Secondary | ICD-10-CM | POA: Diagnosis present

## 2021-07-11 DIAGNOSIS — Z006 Encounter for examination for normal comparison and control in clinical research program: Secondary | ICD-10-CM | POA: Diagnosis not present

## 2021-07-11 DIAGNOSIS — I442 Atrioventricular block, complete: Secondary | ICD-10-CM | POA: Diagnosis present

## 2021-07-11 DIAGNOSIS — R001 Bradycardia, unspecified: Secondary | ICD-10-CM | POA: Diagnosis not present

## 2021-07-11 DIAGNOSIS — Z794 Long term (current) use of insulin: Secondary | ICD-10-CM | POA: Diagnosis not present

## 2021-07-11 DIAGNOSIS — D649 Anemia, unspecified: Secondary | ICD-10-CM | POA: Diagnosis present

## 2021-07-11 DIAGNOSIS — Z20822 Contact with and (suspected) exposure to covid-19: Secondary | ICD-10-CM | POA: Diagnosis present

## 2021-07-11 DIAGNOSIS — I959 Hypotension, unspecified: Secondary | ICD-10-CM | POA: Diagnosis present

## 2021-07-11 DIAGNOSIS — I110 Hypertensive heart disease with heart failure: Secondary | ICD-10-CM | POA: Diagnosis present

## 2021-07-11 HISTORY — PX: TEMPORARY PACEMAKER: CATH118268

## 2021-07-11 LAB — CBC WITH DIFFERENTIAL/PLATELET
Abs Immature Granulocytes: 0.1 10*3/uL — ABNORMAL HIGH (ref 0.00–0.07)
Basophils Absolute: 0 10*3/uL (ref 0.0–0.1)
Basophils Relative: 0 %
Eosinophils Absolute: 0 10*3/uL (ref 0.0–0.5)
Eosinophils Relative: 0 %
HCT: 34.6 % — ABNORMAL LOW (ref 36.0–46.0)
Hemoglobin: 10.4 g/dL — ABNORMAL LOW (ref 12.0–15.0)
Immature Granulocytes: 1 %
Lymphocytes Relative: 2 %
Lymphs Abs: 0.3 10*3/uL — ABNORMAL LOW (ref 0.7–4.0)
MCH: 29.1 pg (ref 26.0–34.0)
MCHC: 30.1 g/dL (ref 30.0–36.0)
MCV: 96.9 fL (ref 80.0–100.0)
Monocytes Absolute: 0.8 10*3/uL (ref 0.1–1.0)
Monocytes Relative: 5 %
Neutro Abs: 13.9 10*3/uL — ABNORMAL HIGH (ref 1.7–7.7)
Neutrophils Relative %: 92 %
Platelets: 238 10*3/uL (ref 150–400)
RBC: 3.57 MIL/uL — ABNORMAL LOW (ref 3.87–5.11)
RDW: 13.6 % (ref 11.5–15.5)
WBC: 15.1 10*3/uL — ABNORMAL HIGH (ref 4.0–10.5)
nRBC: 0 % (ref 0.0–0.2)

## 2021-07-11 LAB — COMPREHENSIVE METABOLIC PANEL
ALT: 81 U/L — ABNORMAL HIGH (ref 0–44)
AST: 58 U/L — ABNORMAL HIGH (ref 15–41)
Albumin: 3.3 g/dL — ABNORMAL LOW (ref 3.5–5.0)
Alkaline Phosphatase: 70 U/L (ref 38–126)
Anion gap: 8 (ref 5–15)
BUN: 23 mg/dL (ref 8–23)
CO2: 27 mmol/L (ref 22–32)
Calcium: 8 mg/dL — ABNORMAL LOW (ref 8.9–10.3)
Chloride: 104 mmol/L (ref 98–111)
Creatinine, Ser: 0.97 mg/dL (ref 0.44–1.00)
GFR, Estimated: >60 mL/min (ref >60)
Glucose, Bld: 401 mg/dL — ABNORMAL HIGH (ref 70–99)
Potassium: 4.6 mmol/L (ref 3.5–5.1)
Sodium: 139 mmol/L (ref 135–145)
Total Bilirubin: 0.6 mg/dL (ref 0.3–1.2)
Total Protein: 6.4 g/dL — ABNORMAL LOW (ref 6.5–8.1)

## 2021-07-11 LAB — TROPONIN I (HIGH SENSITIVITY)
Troponin I (High Sensitivity): 28 ng/L — ABNORMAL HIGH (ref <18)
Troponin I (High Sensitivity): 33 ng/L — ABNORMAL HIGH (ref <18)

## 2021-07-11 LAB — URINE DRUG SCREEN, QUALITATIVE (ARMC ONLY)
Amphetamines, Ur Screen: NOT DETECTED
Barbiturates, Ur Screen: NOT DETECTED
Benzodiazepine, Ur Scrn: POSITIVE — AB
Cannabinoid 50 Ng, Ur ~~LOC~~: NOT DETECTED
Cocaine Metabolite,Ur ~~LOC~~: NOT DETECTED
MDMA (Ecstasy)Ur Screen: NOT DETECTED
Methadone Scn, Ur: NOT DETECTED
Opiate, Ur Screen: NOT DETECTED
Phencyclidine (PCP) Ur S: NOT DETECTED
Tricyclic, Ur Screen: NOT DETECTED

## 2021-07-11 LAB — BLOOD GAS, ARTERIAL
Acid-base deficit: 16.4 mmol/L — ABNORMAL HIGH (ref 0.0–2.0)
Acid-base deficit: 0.2 mmol/L (ref 0.0–2.0)
Bicarbonate: 13.3 mmol/L — ABNORMAL LOW (ref 20.0–28.0)
Bicarbonate: 25.9 mmol/L (ref 20.0–28.0)
FIO2: 1
FIO2: 0.5
MECHVT: 450 mL
MECHVT: 450 mL
O2 Saturation: 99.8 %
O2 Saturation: 97.9 %
PEEP: 5 cmH2O
PEEP: 5 cmH2O
Patient temperature: 37
Patient temperature: 37
RATE: 18 resp/min
RATE: 16 resp/min
pCO2 arterial: 47 mmHg (ref 32.0–48.0)
pCO2 arterial: 48 mmHg (ref 32.0–48.0)
pH, Arterial: 7.06 — CL (ref 7.350–7.450)
pH, Arterial: 7.34 — ABNORMAL LOW (ref 7.350–7.450)
pO2, Arterial: 309 mmHg — ABNORMAL HIGH (ref 83.0–108.0)
pO2, Arterial: 108 mmHg (ref 83.0–108.0)

## 2021-07-11 LAB — RESPIRATORY PANEL BY PCR

## 2021-07-11 LAB — GLUCOSE, CAPILLARY
Glucose-Capillary: 117 mg/dL — ABNORMAL HIGH (ref 70–99)
Glucose-Capillary: 138 mg/dL — ABNORMAL HIGH (ref 70–99)
Glucose-Capillary: 221 mg/dL — ABNORMAL HIGH (ref 70–99)
Glucose-Capillary: 368 mg/dL — ABNORMAL HIGH (ref 70–99)
Glucose-Capillary: 379 mg/dL — ABNORMAL HIGH (ref 70–99)
Glucose-Capillary: 90 mg/dL (ref 70–99)

## 2021-07-11 LAB — LACTIC ACID, PLASMA
Lactic Acid, Venous: 5.9 mmol/L (ref 0.5–1.9)
Lactic Acid, Venous: 4.9 mmol/L (ref 0.5–1.9)

## 2021-07-11 LAB — PHOSPHORUS: Phosphorus: 2.5 mg/dL (ref 2.5–4.6)

## 2021-07-11 LAB — MAGNESIUM: Magnesium: 2.3 mg/dL (ref 1.7–2.4)

## 2021-07-11 LAB — MRSA NEXT GEN BY PCR, NASAL: MRSA by PCR Next Gen: NOT DETECTED

## 2021-07-11 LAB — PROCALCITONIN: Procalcitonin: 0.12 ng/mL

## 2021-07-11 LAB — CBG MONITORING, ED: Glucose-Capillary: 381 mg/dL — ABNORMAL HIGH (ref 70–99)

## 2021-07-11 LAB — HEPATITIS A ANTIBODY, IGM: Hep A IgM: NONREACTIVE

## 2021-07-11 LAB — BRAIN NATRIURETIC PEPTIDE: B Natriuretic Peptide: 84.2 pg/mL (ref 0.0–100.0)

## 2021-07-11 SURGERY — TEMPORARY PACEMAKER

## 2021-07-11 MED ORDER — DOPAMINE-DEXTROSE 3.2-5 MG/ML-% IV SOLN
0.0000 ug/kg/min | INTRAVENOUS | Status: DC
  Administered 2021-07-11: 5 ug/kg/min via INTRAVENOUS

## 2021-07-11 MED ORDER — CHLORHEXIDINE GLUCONATE 0.12 % MT SOLN
15.0000 mL | Freq: Two times a day (BID) | OROMUCOSAL | Status: DC
  Administered 2021-07-11 – 2021-07-14 (×5): 15 mL via OROMUCOSAL
  Filled 2021-07-11 (×3): qty 15

## 2021-07-11 MED ORDER — ENOXAPARIN SODIUM 40 MG/0.4ML IJ SOSY
40.0000 mg | PREFILLED_SYRINGE | INTRAMUSCULAR | Status: DC
  Administered 2021-07-11 – 2021-07-15 (×4): 40 mg via SUBCUTANEOUS
  Filled 2021-07-11 (×4): qty 0.4

## 2021-07-11 MED ORDER — ALPRAZOLAM 0.5 MG PO TABS
0.5000 mg | ORAL_TABLET | Freq: Every evening | ORAL | Status: DC | PRN
  Administered 2021-07-11 – 2021-07-14 (×4): 0.5 mg via ORAL
  Filled 2021-07-11 (×4): qty 1

## 2021-07-11 MED ORDER — DOCUSATE SODIUM 50 MG/5ML PO LIQD
100.0000 mg | Freq: Two times a day (BID) | ORAL | Status: DC
  Administered 2021-07-11: 100 mg
  Filled 2021-07-11: qty 10

## 2021-07-11 MED ORDER — POLYETHYLENE GLYCOL 3350 17 G PO PACK
17.0000 g | PACK | Freq: Every day | ORAL | Status: DC
  Administered 2021-07-11: 17 g
  Filled 2021-07-11: qty 1

## 2021-07-11 MED ORDER — MIDAZOLAM HCL 2 MG/2ML IJ SOLN
INTRAMUSCULAR | Status: AC
  Administered 2021-07-11: 2 mg via INTRAVENOUS
  Filled 2021-07-11: qty 2

## 2021-07-11 MED ORDER — IOHEXOL 350 MG/ML SOLN
75.0000 mL | Freq: Once | INTRAVENOUS | Status: AC | PRN
  Administered 2021-07-11: 75 mL via INTRAVENOUS

## 2021-07-11 MED ORDER — SODIUM BICARBONATE 8.4 % IV SOLN
100.0000 meq | Freq: Once | INTRAVENOUS | Status: AC
  Administered 2021-07-11: 100 meq via INTRAVENOUS

## 2021-07-11 MED ORDER — ALPRAZOLAM 0.5 MG PO TABS
0.2500 mg | ORAL_TABLET | Freq: Once | ORAL | Status: AC
  Administered 2021-07-11: 0.25 mg via ORAL
  Filled 2021-07-11: qty 1

## 2021-07-11 MED ORDER — IPRATROPIUM-ALBUTEROL 0.5-2.5 (3) MG/3ML IN SOLN
3.0000 mL | Freq: Four times a day (QID) | RESPIRATORY_TRACT | Status: DC | PRN
  Administered 2021-07-11: 3 mL via RESPIRATORY_TRACT
  Filled 2021-07-11: qty 3

## 2021-07-11 MED ORDER — IPRATROPIUM-ALBUTEROL 0.5-2.5 (3) MG/3ML IN SOLN
3.0000 mL | Freq: Four times a day (QID) | RESPIRATORY_TRACT | Status: DC
  Administered 2021-07-11 (×2): 3 mL via RESPIRATORY_TRACT
  Filled 2021-07-11 (×2): qty 3

## 2021-07-11 MED ORDER — ALBUTEROL SULFATE (2.5 MG/3ML) 0.083% IN NEBU: 2.5000 mg | INHALATION_SOLUTION | RESPIRATORY_TRACT | Status: DC | PRN

## 2021-07-11 MED ORDER — HEPARIN (PORCINE) IN NACL 1000-0.9 UT/500ML-% IV SOLN
INTRAVENOUS | Status: DC | PRN
  Administered 2021-07-11: 500 mL

## 2021-07-11 MED ORDER — MIDAZOLAM HCL 2 MG/2ML IJ SOLN: 2.0000 mg | Freq: Once | INTRAMUSCULAR | Status: DC

## 2021-07-11 MED ORDER — DOPAMINE-DEXTROSE 3.2-5 MG/ML-% IV SOLN
INTRAVENOUS | Status: AC
  Administered 2021-07-11: 5 ug/kg/min via INTRAVENOUS
  Filled 2021-07-11: qty 250

## 2021-07-11 MED ORDER — ALBUTEROL SULFATE (2.5 MG/3ML) 0.083% IN NEBU
2.5000 mg | INHALATION_SOLUTION | Freq: Four times a day (QID) | RESPIRATORY_TRACT | Status: DC
  Administered 2021-07-11 – 2021-07-15 (×13): 2.5 mg via RESPIRATORY_TRACT
  Filled 2021-07-11 (×15): qty 3

## 2021-07-11 MED ORDER — METHYLPREDNISOLONE SODIUM SUCC 40 MG IJ SOLR
40.0000 mg | Freq: Every day | INTRAMUSCULAR | Status: DC
  Administered 2021-07-11 – 2021-07-15 (×4): 40 mg via INTRAVENOUS
  Filled 2021-07-11 (×4): qty 1

## 2021-07-11 MED ORDER — CHLORHEXIDINE GLUCONATE 0.12% ORAL RINSE (MEDLINE KIT)
15.0000 mL | Freq: Two times a day (BID) | OROMUCOSAL | Status: DC
  Administered 2021-07-11: 15 mL via OROMUCOSAL

## 2021-07-11 MED ORDER — CALCIUM GLUCONATE-NACL 1-0.675 GM/50ML-% IV SOLN: 1.0000 g | Freq: Once | INTRAVENOUS | Status: DC

## 2021-07-11 MED ORDER — BUDESONIDE 0.25 MG/2ML IN SUSP
0.2500 mg | Freq: Two times a day (BID) | RESPIRATORY_TRACT | Status: DC
  Administered 2021-07-11 – 2021-07-15 (×8): 0.25 mg via RESPIRATORY_TRACT
  Filled 2021-07-11 (×9): qty 2

## 2021-07-11 MED ORDER — SUCCINYLCHOLINE CHLORIDE 200 MG/10ML IV SOSY
PREFILLED_SYRINGE | INTRAVENOUS | Status: AC
  Filled 2021-07-11: qty 10

## 2021-07-11 MED ORDER — FAMOTIDINE 20 MG PO TABS
20.0000 mg | ORAL_TABLET | Freq: Every day | ORAL | Status: DC
  Administered 2021-07-11 – 2021-07-15 (×4): 20 mg via ORAL
  Filled 2021-07-11 (×4): qty 1

## 2021-07-11 MED ORDER — CEFAZOLIN SODIUM-DEXTROSE 2-4 GM/100ML-% IV SOLN
2.0000 g | INTRAVENOUS | Status: AC
  Filled 2021-07-11: qty 100

## 2021-07-11 MED ORDER — FENTANYL 2500MCG IN NS 250ML (10MCG/ML) PREMIX INFUSION
50.0000 ug/h | INTRAVENOUS | Status: DC
Start: 2021-07-11 — End: 2021-07-11
  Administered 2021-07-11: 50 ug/h via INTRAVENOUS

## 2021-07-11 MED ORDER — PROPOFOL 1000 MG/100ML IV EMUL
5.0000 ug/kg/min | INTRAVENOUS | Status: DC
  Administered 2021-07-11: 40 ug/kg/min via INTRAVENOUS
  Administered 2021-07-11: 20 ug/kg/min via INTRAVENOUS
  Filled 2021-07-11 (×2): qty 100

## 2021-07-11 MED ORDER — FENTANYL BOLUS VIA INFUSION
50.0000 ug | INTRAVENOUS | Status: DC | PRN
  Administered 2021-07-11: 50 ug via INTRAVENOUS
  Filled 2021-07-11: qty 100

## 2021-07-11 MED ORDER — ATROPINE SULFATE 1 MG/10ML IJ SOSY
0.5000 mg | PREFILLED_SYRINGE | Freq: Once | INTRAMUSCULAR | Status: AC
  Administered 2021-07-11: 0.5 mg via INTRAVENOUS
  Administered 2021-07-11: 1 mg via INTRAVENOUS

## 2021-07-11 MED ORDER — SODIUM CHLORIDE 0.9 % IV SOLN
3.0000 g | Freq: Four times a day (QID) | INTRAVENOUS | Status: DC
  Administered 2021-07-11 – 2021-07-15 (×17): 3 g via INTRAVENOUS
  Filled 2021-07-11: qty 3
  Filled 2021-07-11: qty 8
  Filled 2021-07-11 (×3): qty 3
  Filled 2021-07-11 (×2): qty 8
  Filled 2021-07-11 (×2): qty 3
  Filled 2021-07-11 (×2): qty 8
  Filled 2021-07-11: qty 3
  Filled 2021-07-11: qty 8
  Filled 2021-07-11 (×2): qty 3
  Filled 2021-07-11: qty 8
  Filled 2021-07-11 (×2): qty 3
  Filled 2021-07-11: qty 8
  Filled 2021-07-11: qty 3
  Filled 2021-07-11: qty 8

## 2021-07-11 MED ORDER — LIDOCAINE HCL (PF) 1 % IJ SOLN
INTRAMUSCULAR | Status: DC | PRN
  Administered 2021-07-11: 5 mL

## 2021-07-11 MED ORDER — NOREPINEPHRINE 4 MG/250ML-% IV SOLN
INTRAVENOUS | Status: AC
  Filled 2021-07-11: qty 250

## 2021-07-11 MED ORDER — FENTANYL 2500MCG IN NS 250ML (10MCG/ML) PREMIX INFUSION
INTRAVENOUS | Status: AC
  Filled 2021-07-11: qty 250

## 2021-07-11 MED ORDER — CHLORHEXIDINE GLUCONATE CLOTH 2 % EX PADS
6.0000 | MEDICATED_PAD | Freq: Every day | CUTANEOUS | Status: DC
  Administered 2021-07-11 – 2021-07-15 (×4): 6 via TOPICAL

## 2021-07-11 MED ORDER — INSULIN ASPART 100 UNIT/ML IJ SOLN
0.0000 [IU] | INTRAMUSCULAR | Status: DC
  Administered 2021-07-11: 20 [IU] via SUBCUTANEOUS
  Administered 2021-07-11 – 2021-07-12 (×2): 7 [IU] via SUBCUTANEOUS
  Administered 2021-07-12: 3 [IU] via SUBCUTANEOUS
  Administered 2021-07-12: 11 [IU] via SUBCUTANEOUS
  Administered 2021-07-13 (×3): 4 [IU] via SUBCUTANEOUS
  Administered 2021-07-14: 12:00:00 3 [IU] via SUBCUTANEOUS
  Administered 2021-07-14: 7 [IU] via SUBCUTANEOUS
  Filled 2021-07-11 (×12): qty 1

## 2021-07-11 MED ORDER — ORAL CARE MOUTH RINSE
15.0000 mL | Freq: Two times a day (BID) | OROMUCOSAL | Status: DC
  Administered 2021-07-11 – 2021-07-15 (×7): 15 mL via OROMUCOSAL

## 2021-07-11 MED ORDER — ETOMIDATE 2 MG/ML IV SOLN
INTRAVENOUS | Status: AC
  Administered 2021-07-11: 20 mg
  Filled 2021-07-11: qty 10

## 2021-07-11 MED ORDER — PREDNISONE 20 MG PO TABS: ORAL_TABLET | ORAL | 0 refills | Status: DC

## 2021-07-11 MED ORDER — ORAL CARE MOUTH RINSE
15.0000 mL | OROMUCOSAL | Status: DC
  Administered 2021-07-11 (×2): 15 mL via OROMUCOSAL

## 2021-07-11 MED ORDER — SODIUM CHLORIDE 0.9 % IV BOLUS
1000.0000 mL | Freq: Once | INTRAVENOUS | Status: AC
  Administered 2021-07-11: 1000 mL via INTRAVENOUS

## 2021-07-11 MED ORDER — SODIUM BICARBONATE 8.4 % IV SOLN
100.0000 meq | Freq: Once | INTRAVENOUS | Status: DC
  Filled 2021-07-11: qty 50

## 2021-07-11 MED ORDER — ALPRAZOLAM 0.25 MG PO TABS: 0.2500 mg | ORAL_TABLET | Freq: Three times a day (TID) | ORAL | 0 refills | Status: DC | PRN

## 2021-07-11 MED ORDER — LIDOCAINE HCL 1 % IJ SOLN
INTRAMUSCULAR | Status: AC
  Filled 2021-07-11: qty 20

## 2021-07-11 MED ORDER — DOCUSATE SODIUM 100 MG PO CAPS: 100.0000 mg | ORAL_CAPSULE | Freq: Two times a day (BID) | ORAL | Status: DC | PRN

## 2021-07-11 MED ORDER — SODIUM CHLORIDE 0.9 % IV SOLN: INTRAVENOUS | Status: DC

## 2021-07-11 MED ORDER — MIDAZOLAM HCL 2 MG/2ML IJ SOLN
2.0000 mg | Freq: Once | INTRAMUSCULAR | Status: AC
  Filled 2021-07-11: qty 2

## 2021-07-11 MED ORDER — INSULIN DETEMIR 100 UNIT/ML ~~LOC~~ SOLN
12.0000 [IU] | Freq: Every day | SUBCUTANEOUS | Status: DC
  Administered 2021-07-11 – 2021-07-12 (×2): 12 [IU] via SUBCUTANEOUS
  Filled 2021-07-11 (×4): qty 0.12

## 2021-07-11 MED ORDER — PHENOL 1.4 % MT LIQD
1.0000 | OROMUCOSAL | Status: DC | PRN
  Filled 2021-07-11: qty 177

## 2021-07-11 MED ORDER — SODIUM CHLORIDE 0.45 % IV SOLN: INTRAVENOUS | Status: DC

## 2021-07-11 MED ORDER — POLYETHYLENE GLYCOL 3350 17 G PO PACK: 17.0000 g | PACK | Freq: Every day | ORAL | Status: DC | PRN

## 2021-07-11 SURGICAL SUPPLY — 8 items
CABLE ADAPT PACING TEMP 12FT (ADAPTER) ×2 IMPLANT
CANNULA 5F STIFF (CANNULA) IMPLANT
DRAPE BRACHIAL (DRAPES) ×2 IMPLANT
KIT SYRINGE INJ CVI SPIKEX1 (MISCELLANEOUS) ×2 IMPLANT
NEEDLE PERC 18GX7CM (NEEDLE) ×2 IMPLANT
SHEATH AVANTI 6FR X 11CM (SHEATH) ×2 IMPLANT
SLEEVE REPOSITIONING LENGTH 30 (MISCELLANEOUS) ×2 IMPLANT
WIRE PACING TEMP ST TIP 5 (CATHETERS) ×2 IMPLANT

## 2021-07-11 NOTE — Progress Notes (Signed)
PHARMACY CONSULT NOTE - FOLLOW UP  Pharmacy Consult for Electrolyte Monitoring and Replacement   Recent Labs: Potassium (mmol/L)  Date Value  07/10/2021 4.3  04/17/2014 3.9   Magnesium (mg/dL)  Date Value  05/01/2021 2.0   Calcium (mg/dL)  Date Value  07/10/2021 8.2 (L)   Calcium, Total (mg/dL)  Date Value  04/17/2014 9.1   Albumin (g/dL)  Date Value  07/10/2021 3.4 (L)  03/28/2020 4.7  04/17/2014 4.1   Phosphorus (mg/dL)  Date Value  05/01/2021 3.3   Sodium (mmol/L)  Date Value  07/10/2021 138  03/28/2020 137  04/17/2014 141     Assessment: 11/24 @ 2043:  Ca = 8.2, Alb = 3.4, Corrected Ca = 8.68  Goal of Therapy:  Electrolytes WNL   Plan:  Will order Calcium gluconate 1 gm IVPB X 1 and recheck electrolytes on 11/25 with AM labs.   Orene Desanctis ,PharmD Clinical Pharmacist 07/11/2021 4:24 AM

## 2021-07-11 NOTE — Progress Notes (Signed)
eLink Physician-Brief Progress Note Patient Name: Angela Jensen DOB: [birth year] MRN: [id number]   Date of Service  07/11/2021  HPI/Events of Note  65 year old woman admitted to ICU with complete heart block and respiratory failure. She had a TVP placed per notes. CCM and cardiology detailed notes pending at this time. She is on minimal vent support with o2 sat 100 on 50%/peep 5, BP in 120s, HR of 106 at this time . No distress on camera.   eICU Interventions  Continue vent support, cardiology consult for heart block, sedation while on vent, follow ABG and adjust vent, and will need DVT and gi prophylaxis. Replete electrolytes and monitor levels. Call E link if needed.      Intervention Category Major Interventions: Respiratory failure - evaluation and management Evaluation Type: New Patient Evaluation  Margaretmary Lombard 07/11/2021, 6:12 AM

## 2021-07-11 NOTE — Progress Notes (Signed)
Pt extubated without complications, placed on HFNC at 10L, sats 100%, tolerating at this time.

## 2021-07-11 NOTE — Progress Notes (Signed)
Initial Nutrition Assessment  DOCUMENTATION CODES:   Not applicable  INTERVENTION:   If tube feeds initiated, recommend:  Vital HP @45ml /hr  Free water flushes 102ml q4 hours to maintain tube patency   Propofol: 13.97 ml/hr- provides 369kcal/day   Regimen provides 1449kcal/day, 95g/day protein and 1079ml/day free water   Liquid MVI daily via tube   NUTRITION DIAGNOSIS:   Inadequate oral intake related to inability to eat (pt sedated and ventilated) as evidenced by NPO status.  GOAL:   Provide needs based on ASPEN/SCCM guidelines  MONITOR:   Vent status, Labs, Weight trends, Skin, I & O's  REASON FOR ASSESSMENT:   Ventilator    ASSESSMENT:   65 y/o female with h/o anxiety, depression, substance abuse, lung cancer, HTN, HLD, CHF, OSA, DM and COPD who is admitted with syncope and found to have complete heart block now s/p temporary pacemaker 11/25.  Pt sedated and ventilated. OGT in place. Plan is for possible extubation today. Will initiate tube feeds if pt does not extubate. Pt is known to this RD from a recent previous admit. Pt with good appetite and oral intake at baseline. Of note, pt does wear dentures. Per chart, pt is weight stable at baseline.   Medications reviewed and include: colace, lovenox, pepcid, insulin, solu-medrol, miralax, unasyn, cefazolin, dopamine, propofol   Labs reviewed: K 4.6 wnl, P 2.5 wnl, Mg 2.3 wnl Wbc- 15.1(H), Hgb 10.4(L), Hct 34.6(L) Cbgs- 221, 368, 379, 381 x 24 hrs AIC 6.6(H)- 10/26  Patient is currently intubated on ventilator support MV: 6.0 L/min Temp (24hrs), Avg:97.8 F (36.6 C), Min:97.1 F (36.2 C), Max:98.5 F (36.9 C)  Propofol: 13.97 ml/hr- provides 369kcal/day   MAP- >97mmHg   UOP- 284ml   NUTRITION - FOCUSED PHYSICAL EXAM:  Flowsheet Row Most Recent Value  Orbital Region No depletion  Upper Arm Region Mild depletion  Thoracic and Lumbar Region No depletion  Buccal Region No depletion  Temple Region No  depletion  Clavicle Bone Region No depletion  Clavicle and Acromion Bone Region No depletion  Scapular Bone Region No depletion  Dorsal Hand Unable to assess  Patellar Region Moderate depletion  Anterior Thigh Region No depletion  Posterior Calf Region No depletion  Edema (RD Assessment) None  Hair Reviewed  Eyes Reviewed  Mouth Reviewed  Skin Reviewed  Nails Reviewed   Diet Order:   Diet Order             Diet NPO time specified Except for: Sips with Meds  Diet effective midnight                  EDUCATION NEEDS:   No education needs have been identified at this time  Skin:  Skin Assessment: Reviewed RN Assessment (ecchymosis)  Last BM:  11/23- type 4  Height:   Ht Readings from Last 1 Encounters:  07/11/21 4' 11.02" (1.499 m)    Weight:   Wt Readings from Last 1 Encounters:  07/10/21 58.2 kg    Ideal Body Weight:  44.5 kg  BMI:  Body mass index is 25.92 kg/m.  Estimated Nutritional Needs:   Kcal:  1132kcal/day  Protein:  80-95g/day  Fluid:  1.3-1.6L/day  Koleen Distance MS, RD, LDN Please refer to [facility] for RD and/or RD on-call/weekend/after hours pager

## 2021-07-11 NOTE — ED Provider Notes (Signed)
-----------------------------------------   2:11 AM on 07/11/2021 -----------------------------------------   CTA chest interpreted per Dr. Mckinley Jewel:  1. No pulmonary embolus.  2. Slightly increased in size and conspicuity over several months of  larger than 1 cm nodular-like densities along the right minor and  major fissures in the region of prior radiation therapy. Similarly  slight interval increase in size of the right apex subpleural  nodule. Recurrent malignancy cannot be excluded. Additional imaging  evaluation or consultation with Pulmonology or Thoracic Surgery  recommended.  3. Aortic Atherosclerosis (ICD10-I70.0) and Emphysema (ICD10-J43.9).   Patient smiling, states she is feeling significantly better and is eager for discharge home.  I did offer her readmission to the hospital, this time for COPD exacerbation.  No significant increase in troponin; patient voices no complaints of chest pain and desires to go home.  There is no wheezing or tachypnea on exam.  Aeration good.  Heart rate 104.  Patient states she ran out of her Xanax 4 days ago.  This may be contributing to her tachycardia as well as the discontinuation of her Coreg.  Will administer single dose now and provide limited quantity as a prescription until she can see her doctor.  She knows about her lung nodules and see Dr. Chancy Milroy for pulmonology.  Strict return precautions given.  Patient verbalizes understanding agrees with plan of care.   ----------------------------------------- 3:40 AM on 07/11/2021 -----------------------------------------   Patient had been picked up by her husband and who had just left the parking lot when she complained of increased shortness of breath and had a syncopal episode.  Eyes open but unresponsive, guppy breathing, diaphoretic.  Intubated for airway protection.  Bradycardia noted which responds to atropine.  Pacer pads applied.  Patient tolerated intubation well, did not lose pulse.   Discussed with CCU intensivist Ouma who is at bedside evaluating patient for admission.  Patient is now hypotensive with third-degree heart block noted on EKG.  Patient started on dopamine, sodium bicarbonate given.  Cardiology to take patient to Cath Lab for pacemaker.  Procedure Name: Intubation Date/Time: 07/11/2021 4:10 AM Performed by: Paulette Blanch, MD Pre-anesthesia Checklist: Patient identified, Patient being monitored, Emergency Drugs available, Timeout performed and Suction available Oxygen Delivery Method: Ambu bag Preoxygenation: Pre-oxygenation with 100% oxygen Induction Type: Rapid sequence and Cricoid Pressure applied Ventilation: Mask ventilation without difficulty Laryngoscope Size: Glidescope and 3 Grade View: Grade I Tube size: 7.5 mm Number of attempts: 1 Airway Equipment and Method: Rigid stylet Placement Confirmation: ETT inserted through vocal cords under direct vision, CO2 detector and Breath sounds checked- equal and bilateral Dental Injury: Teeth and Oropharynx as per pre-operative assessment    CRITICAL CARE Performed by: Paulette Blanch   Total critical care time: 45 minutes  Critical care time was exclusive of separately billable procedures and treating other patients.  Critical care was necessary to treat or prevent imminent or life-threatening deterioration.  Critical care was time spent personally by me on the following activities: development of treatment plan with patient and/or surrogate as well as nursing, discussions with consultants, evaluation of patient's response to treatment, examination of patient, obtaining history from patient or surrogate, ordering and performing treatments and interventions, ordering and review of laboratory studies, ordering and review of radiographic studies, pulse oximetry and re-evaluation of patient's condition.     Paulette Blanch, MD 07/11/21 [phone]

## 2021-07-11 NOTE — Progress Notes (Signed)
PHARMACY CONSULT NOTE - FOLLOW UP  Pharmacy Consult for Electrolyte Monitoring and Replacement   Recent Labs: Potassium (mmol/L)  Date Value  07/11/2021 4.6  04/17/2014 3.9   Magnesium (mg/dL)  Date Value  07/11/2021 2.3   Calcium (mg/dL)  Date Value  07/11/2021 8.0 (L)   Calcium, Total (mg/dL)  Date Value  04/17/2014 9.1   Albumin (g/dL)  Date Value  07/11/2021 3.3 (L)  03/28/2020 4.7  04/17/2014 4.1   Phosphorus (mg/dL)  Date Value  07/11/2021 2.5   Sodium (mmol/L)  Date Value  07/11/2021 139  03/28/2020 137  04/17/2014 141     Assessment: 65yo female recently admitted 11/22-11/24 with symptomatic bradycardia and presyncope that improved/stabilized and was prepared to go home with close f/u. However, upon discharge had a syncopal episode in the parking lot and req'd readmission. 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 Pharmacy consulted for electrolytes mgmt.  11/24 @ 2043:  Ca = 8.2, Alb = 3.4, Corrected Ca = 8.68   Goal of Therapy:  Electrolytes WNL   Plan:  No further repletion req'd at this time.  Recheck electrolytes with AM labs.   Lorna Dibble, PharmD, [facility] Clinical Pharmacist 07/11/2021 8:18 AM

## 2021-07-11 NOTE — H&P (Signed)
NAME:  Angela Jensen, MRN:  [id number], DOB:  [birth year], LOS: 0 ADMISSION DATE:  07/10/2021, CONSULTATION DATE:  07/11/2021 REFERRING MD:  Lurline Hare, MD CHIEF COMPLAINT:  Syncope   HPI  65 y.o with significant PMH as below who presented to the ED on 11/24  via EMS with chief complaints of shortness of breath.  Patient was recently admitted on 11/22 with symptomatic bradycardia following presyncopal episode.  She remained hemodynamically stable throughout her admission.  She was evaluated by cardiologist who held her carvedilol with recommendation to avoid further AV nodal blocking agents for BP control.  Her heart rate returned to normal and she remained asymptomatic throughout her admission.  Patient was discharged on 11/24 to follow-up with her cardiologist.  She returned to the ED same day with worsening shortness of breath in the setting of COPD exacerbation.  Chest x-ray showed some patchiness in the right side which is stable.  She was treated with duo nebs and 125 mg of Solu-Medrol with notable improvement.  On reassessment patient looked comfortable with improvement in wheezing.  She was offered readmission however she insisted on being discharged due to improvement in her symptoms.  Patient was given strict return precaution and discharged home to follow-up with her cardiology and pulmonologist.  Unfortunately she had a syncopal episode in the parking lot prompting return back to the ED.    ED Course: On arrival to the ED, she was noted to be unresponsive, diaphoretic with agonal breathing.  The patient was intubated for airway protection. EKG obtained and showed third-degree AV block. She was also noted to be hypotensive and bradycardic on the monitor. Patient received Atropine x 1 dose with minimal improvement. Pertinent Labs in Red/Diagnostics Findings: Na+/ K+: 138/4.3 Glucose: 319 BUN/Cr.: 21/0.88 Calcium: 8.2 AST/ALT: 57/81   WBC/ TMAX: 8.4/ afebrile Hgb/Hct: 10.0/33.4 Plts:  207 COVID PCR: Negative   Troponin: 25 Arterial Blood Gas result:  pO2 309; pCO2 47; pH 7.06;  HCO3 13.3, %O2 Sat 99.8. EKG: Complete AV Block Imaging : CXR:  patchy opacities in the right lung base which is improved from prior  PCCM consulted for further management.  In the ED, patient noted with blood pressure 83/35 mm Hg and pulse rate 32 beats/min.  Patient received additional 0.5 of atropine, pacer pads placed and patient started on dopamine with improvement in heart rate and blood pressure.  Code STEMI was initiated and patient evaluated at the bedside by STEMI on-call MD Dr. Saralyn Pilar.  Patient was taken to Cath Lab emergently for temporary pacemaker.  Past Medical History    Chronic respiratory failure with hypoxia (HCC)     CAP (community acquired pneumonia) 06/26/2021   COPD exacerbation ([place]) 06/11/2021   SOB (shortness of breath) 05/01/2021   HCAP (healthcare-associated pneumonia) 05/01/2021   Chronic combined systolic and diastolic CHF (congestive heart failure) ([place]) 05/01/2021   Acute exacerbation of chronic obstructive pulmonary disease (COPD) ([place]) 04/30/2021   Elevated troponin 04/01/2021   Acute on chronic respiratory failure with hypoxia ([place]) 03/31/2021   Pneumothorax after biopsy 05/03/2020   Pneumothorax on right 05/01/2020   Chest pain on breathing 04/21/2020   Pleuritic chest pain 04/11/2020   Right lower lobe pulmonary nodule 04/02/2020   Iron deficiency anemia 04/02/2020   Pica in adults 04/02/2020   Goals of care, counseling/discussion 03/14/2020   Pain in left wrist 09/24/2019   Encounter for general adult medical examination with abnormal findings 06/28/2019   Elevated total protein 01/22/2019   Uncontrolled type  2 diabetes mellitus with hyperglycemia ([place]) 05/16/2018   Chronic obstructive pulmonary disease ([place]) 05/16/2018   Vitamin D deficiency 05/16/2018   Flu vaccine need 05/16/2018   Dysuria 05/16/2018   Cancer of lower lobe of right lung ([place])  01/21/2018   Screening for breast cancer 10/12/2017   Personal history of tobacco use, presenting hazards to health 02/03/2017   PAD (peripheral artery disease) ([place]) 06/22/2016   Essential hypertension 05/26/2016   Diabetes ([place]) 05/26/2016   Hyperlipidemia 05/26/2016   Atherosclerosis of native arteries of extremity with intermittent claudication ([place]) 05/26/2016   Uterine leiomyoma 04/30/2016   Elevated CEA 01/31/2016   Special screening for malignant neoplasms, colon     Benign neoplasm of sigmoid colon     Primary lung cancer ([place])     Malignant neoplasm of upper lobe of right lung ([place])    [facility] Events   11/24: Presented to the ED with COPD exacerbation 11/25: Admitted with symptomatic bradycardia secondary to third-degree AV block status post temporary pacemaker  Consults:  Cardiology  Procedures:  11/25: Intubation 11/25: Temporary pacemaker  Significant Diagnostic Tests:  11/24: Chest Xray>No interval change in bilateral pulmonary opacities compared to the prior radiograph. No large pleural effusion or pneumothorax. Stable cardiomediastinal silhouette. No acute osseous pathology  Micro Data:  11/23: SARS-CoV-2 PCR> negative 11/23: Influenza PCR> negative 11/25: Blood culture x2> 11/25: Urine Culture> 11/25: MRSA PCR>>   Antimicrobials:  None  OBJECTIVE  Blood pressure (!) 158/89, pulse 98, temperature (!) 97.1 F (36.2 C), temperature source Oral, resp. rate 18, height '4\' 11"'  (1.499 m), weight 58.2 kg, SpO2 100 %.    Vent Mode: AC FiO2 (%):  [50 %-100 %] 50 % Set Rate:  [18 bmp] 18 bmp Vt Set:  [450 mL] 450 mL PEEP:  [5 cmH20] 5 cmH20  No intake or output data in the 24 hours ending 07/11/21 0516 Filed Weights   07/10/21 2010  Weight: 58.2 kg   Physical Examination  GENERAL: 65 Year-old critically ill patient lying in the bed intubated, mechanically ventilated and sedated EYES: Pupils equal, round, reactive to light and accommodation. No  scleral icterus. Extraocular muscles intact.  HEENT: Head atraumatic, normocephalic. Oropharynx and nasopharynx clear.  NECK:  Supple, no jugular venous distention. No thyroid enlargement, no tenderness.  LUNGS: Normal breath sounds bilaterally, no wheezing, rales,rhonchi or crepitation. No use of accessory muscles of respiration.  CARDIOVASCULAR: S1, S2 normal. No murmurs, rubs, or gallops.  ABDOMEN: Soft, nontender, nondistended. Bowel sounds present. No organomegaly or mass.  EXTREMITIES: No pedal edema, cyanosis, or clubbing.  NEUROLOGIC: Cranial nerves II through XII are intact.  Muscle strength not checked. Sensation intact. Gait not checked.  PSYCHIATRIC: The patient is intubated and sedated SKIN: No obvious rash, lesion, or ulcer.   Labs/imaging that I havepersonally reviewed  (right click and "Reselect all SmartList Selections" daily)     Labs   CBC: Recent Labs  Lab 07/08/21 2132 07/09/21 0722 07/10/21 2043  WBC 5.1 4.2 8.4  NEUTROABS 3.5  --  7.2  HGB 10.5* 9.5* 10.1*  HCT 33.6* 31.0* 33.4*  MCV 94.9 95.7 97.1  PLT 236 219 297    Basic Metabolic Panel: Recent Labs  Lab 07/08/21 2132 07/09/21 0722 07/10/21 2043  NA 135 140 138  K 3.7 3.9 4.3  CL 102 106 103  CO2 '27 30 30  ' GLUCOSE 163* 172* 319*  BUN '23 14 21  ' CREATININE 0.52 0.52 0.88  CALCIUM 8.7* 8.2* 8.2*   GFR:  Estimated Creatinine Clearance: 49.5 mL/min (by C-G formula based on SCr of 0.88 mg/dL). Recent Labs  Lab 07/08/21 2132 07/09/21 0722 07/10/21 2043  WBC 5.1 4.2 8.4    Liver Function Tests: Recent Labs  Lab 07/08/21 2132 07/10/21 2043  AST 150* 57*  ALT 134* 81*  ALKPHOS 80 71  BILITOT 0.6 0.4  PROT 6.8 6.5  ALBUMIN 3.7 3.4*   No results for input(s): LIPASE, AMYLASE in the last 168 hours. No results for input(s): AMMONIA in the last 168 hours.  ABG    Component Value Date/Time   PHART 7.06 (LL) 07/11/2021 0341   PCO2ART 47 07/11/2021 0341   PO2ART 309 (H) 07/11/2021 0341    HCO3 13.3 (L) 07/11/2021 0341   ACIDBASEDEF 16.4 (H) 07/11/2021 0341   O2SAT 99.8 07/11/2021 0341     Coagulation Profile: No results for input(s): INR, PROTIME in the last 168 hours.  Cardiac Enzymes: No results for input(s): CKTOTAL, CKMB, CKMBINDEX, TROPONINI in the last 168 hours.  HbA1C: Hgb A1c MFr Bld  Date/Time Value Ref Range Status  06/11/2021 01:44 PM 6.6 (H) 4.8 - 5.6 % Final    Comment:    (NOTE) Pre diabetes:          5.7%-6.4%  Diabetes:              >6.4%  Glycemic control for   <7.0% adults with diabetes   04/02/2021 03:45 AM 8.2 (H) 4.8 - 5.6 % Final    Comment:    (NOTE) Pre diabetes:          5.7%-6.4%  Diabetes:              >6.4%  Glycemic control for   <7.0% adults with diabetes     CBG: Recent Labs  Lab 07/09/21 1701 07/09/21 2107 07/10/21 0906 07/10/21 1121 07/11/21 0345  GLUCAP 177* 219* 94 180* 381*    Review of Systems:   UNABLE TO OBTAIN DUE TO PATIENT CURRENTLY INTUBATED AND SEDATED  Past Medical History  She,  has a past medical history of Anemia, Arthritis, Asthma, Atherosclerosis of native arteries of extremity with intermittent claudication ([place]) (05/26/2016), Cancer ([place]) (2012), COPD (chronic obstructive pulmonary disease) ([place]), Depression, Diabetes mellitus without complication ([place]), Essential hypertension (05/26/2016), Hypercholesteremia, Oxygen dependent, PAD (peripheral artery disease) ([place]) (06/22/2016), Peripheral vascular disease ([place]), Personal history of radiation therapy, Shortness of breath dyspnea, Sialolithiasis, Sleep apnea, and Wears dentures.   Surgical History    Past Surgical History:  Procedure Laterality Date   CESAREAN SECTION     x3   COLONOSCOPY WITH PROPOFOL N/A 06/25/2015   Procedure: COLONOSCOPY WITH PROPOFOL;  Surgeon: Lucilla Lame, MD;  Location: [facility];  Service: Endoscopy;  Laterality: N/A;   COLONOSCOPY WITH PROPOFOL N/A 07/26/2020   Procedure: COLONOSCOPY WITH PROPOFOL;   Surgeon: Lucilla Lame, MD;  Location: [place];  Service: Endoscopy;  Laterality: N/A;   CYST REMOVAL LEG     and on shoulder    ESOPHAGOGASTRODUODENOSCOPY (EGD) WITH PROPOFOL N/A 07/26/2020   Procedure: ESOPHAGOGASTRODUODENOSCOPY (EGD) WITH PROPOFOL;  Surgeon: Lucilla Lame, MD;  Location: [place];  Service: Endoscopy;  Laterality: N/A;  Diabetic - oral meds   LOWER EXTREMITY ANGIOGRAPHY Left 09/29/2018   Procedure: LOWER EXTREMITY ANGIOGRAPHY;  Surgeon: Algernon Huxley, MD;  Location: [place] [facility] [facility];  Service: Cardiovascular;  Laterality: Left;   LUNG BIOPSY  05 15 2013   has lung "spots"   PERIPHERAL VASCULAR CATHETERIZATION Left 06/01/2016   Procedure:  Lower Extremity Angiography;  Surgeon: Algernon Huxley, MD;  Location: [place] [facility];  Service: Cardiovascular;  Laterality: Left;   PERIPHERAL VASCULAR CATHETERIZATION N/A 06/01/2016   Procedure: Abdominal Aortogram w/Lower Extremity;  Surgeon: Algernon Huxley, MD;  Location: [place];  Service: Cardiovascular;  Laterality: N/A;   PERIPHERAL VASCULAR CATHETERIZATION  06/01/2016   Procedure: Lower Extremity Intervention;  Surgeon: Algernon Huxley, MD;  Location: [facility] [facility];  Service: Cardiovascular;;   PERIPHERAL VASCULAR CATHETERIZATION Right 06/08/2016   Procedure: Lower Extremity Angiography;  Surgeon: Algernon Huxley, MD;  Location: [place] [facility];  Service: Cardiovascular;  Laterality: Right;   PERIPHERAL VASCULAR CATHETERIZATION  06/08/2016   Procedure: Lower Extremity Intervention;  Surgeon: Algernon Huxley, MD;  Location: [facility] [facility];  Service: Cardiovascular;;   SUBMANDIBULAR GLAND EXCISION Left 12/06/2020   Procedure: EXCISION SUBMANDIBULAR GLAND;  Surgeon: Beverly Gust, MD;  Location: [facility];  Service: ENT;  Laterality: Left;  needs to be first case Diabetic - diet controlled     Social History   reports that she quit smoking about 11 years ago. Her smoking use  included cigarettes. She has a 37.00 pack-year smoking history. She has quit using smokeless tobacco.  Her smokeless tobacco use included snuff. She reports that she does not currently use alcohol after a past usage of about 5.0 standard drinks per week. She reports that she does not currently use drugs after having used the following drugs: Marijuana, "Crack" cocaine, and Cocaine.   Family History   Her family history includes Coronary artery disease in her brother; Diabetes in her maternal grandmother, mother, paternal grandmother, sister, and sister; Heart attack in her brother and brother; Hypercholesterolemia in her mother; Hypertension in her sister; Lung cancer in her father; Vascular Disease in her brother.   Allergies Allergies  Allergen Reactions   Trelegy Ellipta [Fluticasone-Umeclidin-Vilant]      Home Medications  Prior to Admission medications   Medication Sig Start Date End Date Taking? Authorizing Provider  predniSONE (DELTASONE) 20 MG tablet 3 tablets daily x 4 days 07/11/21  Yes Paulette Blanch, MD  Accu-Chek Softclix Lancets lancets Use as instructed for twice a daily Dx E11.65 04/11/21   [name], MD  albuterol (PROVENTIL) (2.5 MG/3ML) 0.083% nebulizer solution USE 1 VIAL IN NEBULIZER EVERY 6 HOURS AS NEEDED FOR WHEEZING 06/30/21   [name], MD  albuterol (VENTOLIN HFA) 108 (90 Base) MCG/ACT inhaler INHALE 2 PUFFS BY MOUTH EVERY 6 HOURS AS NEEDED FOR WHEEZING FOR SHORTNESS OF BREATH 06/26/21   [name], MD  ALPRAZolam Duanne Moron) 0.25 MG tablet Take 1 tablet (0.25 mg total) by mouth 3 (three) times daily as needed for anxiety or sleep. 07/11/21   Paulette Blanch, MD  Blood Glucose Monitoring Suppl (ACCU-CHEK GUIDE) w/Device KIT Use as directed Dx e11.65 04/11/21   [name], MD  clopidogrel (PLAVIX) 75 MG tablet Take 1 tablet by mouth once daily 06/22/21   Jonetta Osgood, NP  escitalopram (LEXAPRO) 10 MG tablet Take 1 tablet (10 mg total) by mouth daily. 05/08/21    Ezekiel Slocumb, DO  famotidine (PEPCID) 20 MG tablet Take 1 tablet (20 mg total) by mouth daily for 14 days. 06/14/21 06/28/21  Nolberto Hanlon, MD  ferrous sulfate 325 (65 FE) MG tablet Take 325 mg by mouth 2 (two) times daily with a meal.    [provider]  fluticasone (FLONASE) 50 MCG/ACT nasal spray Place  2 sprays into both nostrils daily. 05/09/21   Ezekiel Slocumb, DO  gabapentin (NEURONTIN) 100 MG capsule Take 1 capsule (100 mg total) by mouth 2 (two) times daily. 04/08/21   Jonetta Osgood, NP  glucose blood (ACCU-CHEK GUIDE) test strip USE  STRIP TO CHECK GLUCOSE TWICE DAILY 06/08/21   [name], MD  insulin detemir (LEVEMIR FLEXTOUCH) 100 UNIT/ML FlexPen Inject 8- 12 units with supper qd// with needles 04/22/21   [name], MD  lisinopril (ZESTRIL) 10 MG tablet Take 1 tablet (10 mg total) by mouth daily. 05/09/21   Ezekiel Slocumb, DO  metFORMIN (GLUCOPHAGE) 500 MG tablet Take 1 tablet (500 mg total) by mouth daily with breakfast. 03/12/21   [name], MD  mometasone-formoterol [facility]) 200-5 MCG/ACT AERO Inhale 2 puffs into the lungs 2 (two) times daily. 06/17/21   Jonetta Osgood, NP  Omega-3 Fatty Acids (FISH OIL) 1000 MG CAPS Take 1 capsule by mouth daily.    [provider]  OXYGEN Inhale into the lungs.    [provider]  potassium chloride (KLOR-CON) 10 MEQ tablet TAKE 1 TABLET BY MOUTH EVERY OTHER DAY 04/29/21   [name], MD  tiotropium (SPIRIVA HANDIHALER) 18 MCG inhalation capsule INHALE THE CONTENTS OF ONE CAPSULE BY MOUTH ONCE DAILY. DO NOT SWALLOW CAPSULE. 06/17/21   Jonetta Osgood, NP  Scheduled Meds:  [MAR Hold] midazolam  2 mg Intravenous Once   [MAR Hold] sodium bicarbonate  100 mEq Intravenous Once   Continuous Infusions:  [MAR Hold] calcium gluconate     [MAR Hold] DOPamine 5 mcg/kg/min (07/11/21 0355)   norepinephrine     propofol (DIPRIVAN) infusion 20 mcg/kg/min (07/11/21 0424)   PRN Meds:.[MAR Hold] docusate  sodium, Heparin (Porcine) in NaCl, lidocaine (PF), [MAR Hold] polyethylene glycol  Assessment & Plan:  Acute Hypoxic Respiratory Failure secondary to COPD exacerbation Severe metabolic acidosis PMHx: COPD on Chronic home 02 at 2L, Asthma, Lung Cancer, OSA -continue ventilator support & lung protective strategies -Wean PEEP & FiO2 as tolerated, maintain SpO2 > 90% -Head of bed elevated 30 degrees, VAP protocol in place -Plateau pressures less than 30 cm H20  -Intermittent chest x-ray & ABG PRN -Daily WUA with SBT per protocol -Follow blood and urine cultures/wbc/procal and Lactate -Hold Antibiotics for now -Ensure adequate pulmonary hygiene  -Budesonide inhaler nebs BID, bronchodilators PRNBronchodilators PRN -PAD protocol in place; wean sedation/analgesia for RASS goal 0  Symptomatic Bradycardia Presenting with HR in the low 30's, hypotension and syncopal episode S/p temporary pacemaker -Continue dopamine 5-20 mcg/kg/min wean as tolerated -Check TSH, UDS -Monitor BMP+Mag -Hold AV nodal drugs -Cardiology consult, input appreciated, plan for permanent place maker next week>?  Chronic systolic CHF (last known EF 45 to 50% on 04/03/21) -Hypertension now Hypotensive in the setting Symptomatic bradycardia Hx: HLD  -Continuous cardiac monitoring -Maintain MAP greater than 65 -Avoid AV nodal blocking drugs consider amlodipine ARB or ACE inhibitor  -Cardiology following, appreciate input -Repeat 2D Echocardiogram    Diabetes mellitus now with steroids induced hyperglycemia -CBGs -Resistant Sliding scale insulin -Follow ICU hyper/hypoglycemia protocol -Hold home Metformin      Best practice:  Diet:  Tube Feed  Pain/Anxiety/Delirium protocol (if indicated): Yes (RASS goal 0) VAP protocol (if indicated): Yes DVT prophylaxis: LMWH GI prophylaxis: H2B Glucose control:  SSI Yes Central venous access:  N/A Arterial line:  N/A Foley:  Yes, and it is still needed Mobility:  bed rest   PT consulted: N/A Last date of multidisciplinary  goals of care discussion [11/25] Code Status:  full code Disposition: ICU   = Goals of Care = Code Status Order: ICU  Primary Emergency Contact: Graves,Daniel, Home Phone: [phone] Wishes to pursue full aggressive treatment and intervention options, including CPR and intubation, but goals of care will be addressed on going with family if that should become necessary.  Critical care time: 45 minutes     Rufina Falco, DNP, CCRN, FNP-C, AGACNP-BC Acute Care Nurse Practitioner  [facility] Pager: [phone] [place] [facility]  .

## 2021-07-11 NOTE — Progress Notes (Signed)
   07/11/21 0425  Clinical Encounter Type  Visited With Family  Visit Type Initial;Spiritual support;Social support;Code  Referral From Other (Comment) (page)  Consult/Referral To Chaplain  Spiritual Encounters  Spiritual Needs Prayer;Emotional  Chaplain Burris responded to code for stat pacemaker. Did not see Pt due to sterile procedure already underway in ED 26. Located Pt's spouse, Quillian Quince and brought him to family consult rm. Chaplain Burris provided compassionate presence prayer and  in ED and later helped spouse relocate to cath lab waiting. Chaplain Burris also accompanied spouse to ICU and did f/u with ED staff to locate personal belongings. Will continue to follow.

## 2021-07-11 NOTE — Consult Note (Signed)
[facility] Cardiology  CARDIOLOGY CONSULT NOTE  [name] MRN: [id number] DOB/AGE: 1956/06/08 65 y.o.  Admit date: 07/10/2021 Referring Physician Stark Klein Primary Physician Clayborn Bigness Primary Cardiologist [facility] Reason for Consultation complete heart block  HPI: 65 year old female referred for evaluation of complete heart block.  The patient was recently admitted 07/08/2021 for symptomatic bradycardia and presyncope, stabilize after holding carvedilol.  Patient was discharged home, only to return same day with seen shortness of breath COPD exacerbation.  X-ray revealed sided patchiness.  Was treated with duo nebs and IV Solu-Medrol with clinical improvement, and arrange for discharge without admission till she experienced syncope/collapse and ECG revealed pleat heart block with a rate of 40 bpm, with marked hypotension, requiring intubation, treated with atropine and dopamine infusion with return of sinus rhythm.  Patient underwent successful transvenous temporary pacemaker via right internal jugular vein.  Admission labs notable for lactic acid 5.9, with pH 7.06.  Review of systems complete and found to be negative unless listed above     Past Medical History:  Diagnosis Date   Anemia    Arthritis    Asthma    Atherosclerosis of native arteries of extremity with intermittent claudication ([place]) 05/26/2016   Cancer ([place]) 2012   Right Lung CA   COPD (chronic obstructive pulmonary disease) ([place])    Depression    Diabetes mellitus without complication ([place])    Patient takes Janumet   Essential hypertension 05/26/2016   Hypercholesteremia    Oxygen dependent    2L at nite    PAD (peripheral artery disease) ([place]) 06/22/2016   Peripheral vascular disease (HCC)    Personal history of radiation therapy    Shortness of breath dyspnea    with exertion    Sialolithiasis    Sleep apnea    Wears dentures    full upper and lower    Past Surgical History:  Procedure Laterality Date    CESAREAN SECTION     x3   COLONOSCOPY WITH PROPOFOL N/A 06/25/2015   Procedure: COLONOSCOPY WITH PROPOFOL;  Surgeon: Lucilla Lame, MD;  Location: [facility];  Service: Endoscopy;  Laterality: N/A;   COLONOSCOPY WITH PROPOFOL N/A 07/26/2020   Procedure: COLONOSCOPY WITH PROPOFOL;  Surgeon: Lucilla Lame, MD;  Location: [place];  Service: Endoscopy;  Laterality: N/A;   CYST REMOVAL LEG     and on shoulder    ESOPHAGOGASTRODUODENOSCOPY (EGD) WITH PROPOFOL N/A 07/26/2020   Procedure: ESOPHAGOGASTRODUODENOSCOPY (EGD) WITH PROPOFOL;  Surgeon: Lucilla Lame, MD;  Location: [place];  Service: Endoscopy;  Laterality: N/A;  Diabetic - oral meds   LOWER EXTREMITY ANGIOGRAPHY Left 09/29/2018   Procedure: LOWER EXTREMITY ANGIOGRAPHY;  Surgeon: Algernon Huxley, MD;  Location: [place] [facility];  Service: Cardiovascular;  Laterality: Left;   LUNG BIOPSY  05 15 2013   has lung "spots"   PERIPHERAL VASCULAR CATHETERIZATION Left 06/01/2016   Procedure: Lower Extremity Angiography;  Surgeon: Algernon Huxley, MD;  Location: [place] [facility];  Service: Cardiovascular;  Laterality: Left;   PERIPHERAL VASCULAR CATHETERIZATION N/A 06/01/2016   Procedure: Abdominal Aortogram w/Lower Extremity;  Surgeon: Algernon Huxley, MD;  Location: [place] [facility];  Service: Cardiovascular;  Laterality: N/A;   PERIPHERAL VASCULAR CATHETERIZATION  06/01/2016   Procedure: Lower Extremity Intervention;  Surgeon: Algernon Huxley, MD;  Location: [place] [facility];  Service: Cardiovascular;;   PERIPHERAL VASCULAR CATHETERIZATION Right 06/08/2016   Procedure: Lower Extremity Angiography;  Surgeon: Algernon Huxley, MD;  Location: [place] [facility];  Service: Cardiovascular;  Laterality: Right;   PERIPHERAL VASCULAR CATHETERIZATION  06/08/2016   Procedure: Lower Extremity Intervention;  Surgeon: Algernon Huxley, MD;  Location: [place] [facility];  Service: Cardiovascular;;   SUBMANDIBULAR GLAND EXCISION Left  12/06/2020   Procedure: EXCISION SUBMANDIBULAR GLAND;  Surgeon: Beverly Gust, MD;  Location: [place];  Service: ENT;  Laterality: Left;  needs to be first case Diabetic - diet controlled   TEMPORARY PACEMAKER N/A 07/11/2021   Procedure: TEMPORARY PACEMAKER;  Surgeon: Isaias Cowman, MD;  Location: [place] [facility];  Service: Cardiovascular;  Laterality: N/A;    Medications Prior to Admission  Medication Sig Dispense Refill Last Dose   Accu-Chek Softclix Lancets lancets Use as instructed for twice a daily Dx E11.65 100 each 1    albuterol (PROVENTIL) (2.5 MG/3ML) 0.083% nebulizer solution USE 1 VIAL IN NEBULIZER EVERY 6 HOURS AS NEEDED FOR WHEEZING 1080 mL 1    albuterol (VENTOLIN HFA) 108 (90 Base) MCG/ACT inhaler INHALE 2 PUFFS BY MOUTH EVERY 6 HOURS AS NEEDED FOR WHEEZING FOR SHORTNESS OF BREATH 9 g 0    Blood Glucose Monitoring Suppl (ACCU-CHEK GUIDE) w/Device KIT Use as directed Dx e11.65 1 kit 0    clopidogrel (PLAVIX) 75 MG tablet Take 1 tablet by mouth once daily 90 tablet 0    escitalopram (LEXAPRO) 10 MG tablet Take 1 tablet (10 mg total) by mouth daily. 30 tablet 2    famotidine (PEPCID) 20 MG tablet Take 1 tablet (20 mg total) by mouth daily for 14 days. 14 tablet 0    ferrous sulfate 325 (65 FE) MG tablet Take 325 mg by mouth 2 (two) times daily with a meal.      fluticasone (FLONASE) 50 MCG/ACT nasal spray Place 2 sprays into both nostrils daily. 9.9 mL 2    gabapentin (NEURONTIN) 100 MG capsule Take 1 capsule (100 mg total) by mouth 2 (two) times daily. 90 capsule 3    glucose blood (ACCU-CHEK GUIDE) test strip USE  STRIP TO CHECK GLUCOSE TWICE DAILY 100 each 3    insulin detemir (LEVEMIR FLEXTOUCH) 100 UNIT/ML FlexPen Inject 8- 12 units with supper qd// with needles 6 mL 11    lisinopril (ZESTRIL) 10 MG tablet Take 1 tablet (10 mg total) by mouth daily. 30 tablet 2    metFORMIN (GLUCOPHAGE) 500 MG tablet Take 1 tablet (500 mg total) by mouth daily with  breakfast. 90 tablet 3    mometasone-formoterol (DULERA) 200-5 MCG/ACT AERO Inhale 2 puffs into the lungs 2 (two) times daily. 13 g 5    Omega-3 Fatty Acids (FISH OIL) 1000 MG CAPS Take 1 capsule by mouth daily.      OXYGEN Inhale into the lungs.      potassium chloride (KLOR-CON) 10 MEQ tablet TAKE 1 TABLET BY MOUTH EVERY OTHER DAY 45 tablet 3    tiotropium (SPIRIVA HANDIHALER) 18 MCG inhalation capsule INHALE THE CONTENTS OF ONE CAPSULE BY MOUTH ONCE DAILY. DO NOT SWALLOW CAPSULE. 90 capsule 0    Social History   Socioeconomic History   Marital status: Widowed    Spouse name: Not on file   Number of children: Not on file   Years of education: Not on file   Highest education level: Not on file  Occupational History   Not on file  Tobacco Use   Smoking status: Former    Packs/day: 1.00    Years: 37.00    Pack years: 37.00  Types: Cigarettes    Quit date: 02/06/2010    Years since quitting: 11.4   Smokeless tobacco: Former    Types: Snuff  Vaping Use   Vaping Use: Never used  Substance and Sexual Activity   Alcohol use: Not Currently    Alcohol/week: 5.0 standard drinks    Types: 5 Cans of beer per week    Comment: /h x of alcohol abuse -stopped 2012- now drinks 5 beer per week   Drug use: Not Currently    Types: Marijuana, "Crack" cocaine, Cocaine    Comment: hx of cocaine use- last use 2015; last use marijuana6/22/19,   Sexual activity: Yes  Other Topics Concern   Not on file  Social History Narrative   Lives with Significant Other x 43 years   Social Determinants of Health   Financial Resource Strain: Not on file  Food Insecurity: Not on file  Transportation Needs: Not on file  Physical Activity: Not on file  Stress: Not on file  Social Connections: Not on file  Intimate Partner Violence: Not on file    Family History  Problem Relation Age of Onset   Lung cancer Father    Diabetes Mother    Hypercholesterolemia Mother    Diabetes Sister    Diabetes  Maternal Grandmother    Diabetes Paternal Grandmother    Diabetes Sister    Heart attack Brother    Coronary artery disease Brother    Vascular Disease Brother    Hypertension Sister    Heart attack Brother       Review of systems complete and found to be negative unless listed above      PHYSICAL EXAM  General: Well developed, well nourished, in no acute distress HEENT:  Normocephalic and atramatic Neck:  No JVD.  Lungs: Clear bilaterally to auscultation and percussion. Heart: HRRR . Normal S1 and S2 without gallops or murmurs.  Abdomen: Bowel sounds are positive, abdomen soft and non-tender  Msk:  Back normal, normal gait. Normal strength and tone for age. Extremities: No clubbing, cyanosis or edema.   Neuro: Alert and oriented X 3. Psych:  Good affect, responds appropriately  Labs:   Lab Results  Component Value Date   WBC 15.1 (H) 07/11/2021   HGB 10.4 (L) 07/11/2021   HCT 34.6 (L) 07/11/2021   MCV 96.9 07/11/2021   PLT 238 07/11/2021    Recent Labs  Lab 07/11/21 0644  NA 139  K 4.6  CL 104  CO2 27  BUN 23  CREATININE 0.97  CALCIUM 8.0*  PROT 6.4*  BILITOT 0.6  ALKPHOS 70  ALT 81*  AST 58*  GLUCOSE 401*   No results found for: CKTOTAL, CKMB, CKMBINDEX, TROPONINI  Lab Results  Component Value Date   CHOL 160 03/28/2020   CHOL 218 (H) 06/22/2018   Lab Results  Component Value Date   HDL 66 03/28/2020   HDL 66 06/22/2018   Lab Results  Component Value Date   LDLCALC 81 03/28/2020   LDLCALC 115 (H) 06/22/2018   Lab Results  Component Value Date   TRIG 68 03/28/2020   TRIG 183 (H) 06/22/2018   No results found for: CHOLHDL No results found for: LDLDIRECT    Radiology: DG Chest 1 View  Result Date: 07/11/2021 CLINICAL DATA:  Pacemaker.  Intubated. EXAM: PORTABLE CHEST 1 VIEW COMPARISON:  Chest XR, 07/11/2021.  CT chest , 07/11/2021. FINDINGS: Support lines: ETT with tip within the mid-to-distal thoracic trachea, unchanged. RIGHT IJ CVC  with catheter tip within the RIGHT atrium. NG tube with tip excluded from. Overlying pacer leads. Cardiomediastinal silhouette is unchanged. Well inflated lungs with perihilar and basilar coarse interstitial opacities. No pleural effusion pneumothorax. No osseous abnormality. IMPRESSION: 1. Lines and tubes as above. 2. Unchanged pulmonary findings. Electronically Signed   By: Michaelle Birks M.D.   On: 07/11/2021 08:05   DG Chest 2 View  Result Date: 06/26/2021 CLINICAL DATA:  Shortness of breath EXAM: CHEST - 2 VIEW COMPARISON:  06/11/2021 FINDINGS: Background emphysema. Persistent but improved opacities at the right greater than left lung bases. No significant pleural effusion. No pneumothorax. Stable cardiomediastinal contours with normal heart size. IMPRESSION: Persistent but improved pneumonia at the right greater than left lung bases. Electronically Signed   By: Macy Mis M.D.   On: 06/26/2021 13:24   DG Abd 1 View  Result Date: 07/11/2021 CLINICAL DATA:  OG tube placement EXAM: ABDOMEN - 1 VIEW COMPARISON:  None. FINDINGS: Enteric tube passes into the body of the stomach. Included bowel gas pattern is unremarkable. IMPRESSION: Enteric tube within stomach. Electronically Signed   By: Macy Mis M.D.   On: 07/11/2021 08:22   CT Angio Chest PE W and/or Wo Contrast  Result Date: 07/11/2021 CLINICAL DATA:  reports of shortness of breath, pt was discharged from hospital about 3 hours prior to arrival, pt states when she left she was able to eat, but then after while became short of breath. History of lung cancer. EXAM: CT ANGIOGRAPHY CHEST WITH CONTRAST TECHNIQUE: Multidetector CT imaging of the chest was performed using the standard protocol during bolus administration of intravenous contrast. Multiplanar CT image reconstructions and MIPs were obtained to evaluate the vascular anatomy. CONTRAST:  82mL OMNIPAQUE IOHEXOL 350 MG/ML SOLN COMPARISON:  CT angio chest 06/26/2021, CT angiography chest  05/01/2021, CT angiography chest 03/31/2021, chest x-ray 08/01/2011, CT chest 08/01/2015, CT chest 02/09/2021 FINDINGS: Cardiovascular: Fair opacification of the pulmonary arteries to the segmental level. No evidence of pulmonary embolism. Normal heart size. No significant pericardial effusion. The thoracic aorta is normal in caliber. Mild atherosclerotic plaque of the thoracic aorta. No coronary artery calcifications. Mediastinum/Nodes: No enlarged mediastinal, hilar, or axillary lymph nodes. Thyroid gland, trachea, and esophagus demonstrate no significant findings. Lungs/Pleura: Limited evaluation due to respiratory motion artifact. At least moderate centrilobular emphysematous changes with similar-appearing reticulations along the peripheral right lower lobe. Linear atelectasis versus scarring again noted within the right lower lobe. Difficult to measure but likely interval increase in size and conspicuity over several months of a 0.8 x 1.3 cm nodular-like density along the subpleural right apex and a couple of nodular-like densities along the right major and minor fissures: 1.5 x 1.6 cm (7:43) and 1.7 x 1.1 cm (7:49, 8:17-22). No focal consolidation. No pleural effusion.  No pneumothorax. Upper Abdomen: No acute abnormality. Musculoskeletal: No chest wall abnormality. No suspicious lytic or blastic osseous lesions. No acute displaced fracture. Review of the MIP images confirms the above findings. IMPRESSION: 1. No pulmonary embolus. 2. Slightly increased in size and conspicuity over several months of larger than 1 cm nodular-like densities along the right minor and major fissures in the region of prior radiation therapy. Similarly slight interval increase in size of the right apex subpleural nodule. Recurrent malignancy cannot be excluded. Additional imaging evaluation or consultation with Pulmonology or Thoracic Surgery recommended. 3. Aortic Atherosclerosis (ICD10-I70.0) and Emphysema (ICD10-J43.9).  Electronically Signed   By: Iven Finn M.D.   On: 07/11/2021 01:53   CT Angio  Chest Pulmonary Embolism (PE) W or WO Contrast  Result Date: 06/26/2021 CLINICAL DATA:  Difficulty breathing, COPD EXAM: CT ANGIOGRAPHY CHEST WITH CONTRAST TECHNIQUE: Multidetector CT imaging of the chest was performed using the standard protocol during bolus administration of intravenous contrast. Multiplanar CT image reconstructions and MIPs were obtained to evaluate the vascular anatomy. CONTRAST:  51mL OMNIPAQUE IOHEXOL 350 MG/ML SOLN COMPARISON:  05/01/2021 FINDINGS: Cardiovascular: There is homogeneous enhancement in thoracic aorta. There are no intraluminal filling defects in the central pulmonary artery branches. There is slightly inhomogeneous attenuation in the small peripheral branches in the both lower lobes. In the image 59 of series 4, there is questionable exophytic low-density in the anterior margin of 1 of the segmental branches in the left lower lobe which could not be distinctly seen in the adjacent images suggesting possible artifact. Evaluation is limited by motion artifacts in both lungs and infiltrates/scarring in the right lung. Mediastinum/Nodes: No new significant lymphadenopathy seen Lungs/Pleura: Marked bronchospasm seen in the previous study is not evident in the current study. Centrilobular and panlobular emphysema is seen in both lungs. There is small patchy infiltrate in lateral aspect of right apex with no significant change. There is focal alveolar density in the posterior segment of right upper lobe with no significant change. There are patchy infiltrates in the right lower lobe with no significant interval change. Increased interstitial markings are seen in the right lung with no significant change suggesting scarring. There are no new focal pulmonary infiltrates. There is no significant pleural effusion or pneumothorax. Upper Abdomen: There is fatty infiltration in the liver Musculoskeletal:  Unremarkable Review of the MIP images confirms the above findings. IMPRESSION: There is no evidence central pulmonary artery embolism. Evaluation of small peripheral pulmonary artery branches is limited by motion artifacts and infiltrates, especially in the lower lobes. There is no evidence of right ventricular strain. COPD. There are patchy infiltrates and fibrotic changes in the right lung with no significant interval change. There is no pleural effusion or pneumothorax. Electronically Signed   By: Elmer Picker M.D.   On: 06/26/2021 16:51   CARDIAC CATHETERIZATION  Result Date: 07/11/2021 Successful placement of transvenous temporary pacemaker via right internal jugular vein   DG Chest Port 1 View  Result Date: 07/11/2021 CLINICAL DATA:  Intubation. EXAM: PORTABLE CHEST 1 VIEW COMPARISON:  Chest radiograph dated 07/10/2021. FINDINGS: Endotracheal tube with tip approximately 3 cm above the carina. No interval change in bilateral pulmonary opacities compared to the prior radiograph. No large pleural effusion or pneumothorax. Stable cardiomediastinal silhouette. No acute osseous pathology. IMPRESSION: Endotracheal tube with tip above the carina. Electronically Signed   By: Anner Crete M.D.   On: 07/11/2021 03:52   DG Chest Portable 1 View  Result Date: 07/10/2021 CLINICAL DATA:  Rule out pneumonia. EXAM: PORTABLE CHEST 1 VIEW COMPARISON:  Chest radiograph dated 06/26/2021. Chest CT dated 06/26/2021. FINDINGS: Background of emphysema. Overall similar appearance of bilateral lower lung field airspace densities compared to prior radiograph. No new consolidation. There is no pleural effusion pneumothorax. Stable cardiac silhouette. No acute osseous pathology. IMPRESSION: No significant interval change. Electronically Signed   By: Anner Crete M.D.   On: 07/10/2021 20:29   DG Chest Portable 1 View  Result Date: 06/11/2021 CLINICAL DATA:  Shortness of breath, wheezing. EXAM: PORTABLE  CHEST 1 VIEW COMPARISON:  Chest radiograph 05/06/2021; CT chest 05/01/2021 FINDINGS: Upper lobe predominant emphysematous changes with reticulation at the lung bases. Interstitial and patchy airspace opacities in the mid and  lower lungs, right greater than left. No pleural effusion or pneumothorax. Scarring at the right apex. Heart is normal in size. No acute osseous abnormality. IMPRESSION: Interstitial and patchy airspace opacities in the mid and lower lungs, right-greater-than-left. Findings are concerning for multifocal pneumonia in the appropriate clinical context. Emphysema (ICD10-J43.9). Electronically Signed   By: Ileana Roup M.D.   On: 06/11/2021 14:20    EKG: Complete heart block with right bundle branch block at a rate of 37 bpm  ASSESSMENT AND PLAN:   1.  Complete heart block, with syncope/collapse, status post temporary transvenous pacemaker right internal jugular vein, currently in sinus rhythm, in the setting of severe metabolic/lactic acidosis 2.  Acute hypoxic respiratory failure secondary to COPD exacerbation, with severe metabolic and lactic acidosis patient with known COPD, on chronic home O2 therapy, history of lung cancer 3.  Chronic systolic congestive heart failure, LVEF 45 to 50% x 2 D echocardiogram 04/03/2021  Recommendations  1.  Agree with current therapy 2.  Continue temporary transvenous pacemaker as backup, set at 70 bpm 3.  Micra AV leadless pacemaker, into the schedule for 07/14/2021 pending patient's clinical course and treatment of cute hypoxic respiratory failure/COPD exacerbation/metabolic acidosis  Signed: Isaias Cowman MD,PhD, [facility] 07/11/2021, 9:54 AM

## 2021-07-11 NOTE — Progress Notes (Signed)
Received patient from the cath lab post temporary pacemaker placement. No report from ED. Patient settled and sedation adjusted to make patient comfortable. Stable and will continue to monitor.

## 2021-07-11 NOTE — Discharge Instructions (Addendum)
1.  Finish Prednisone daily x4 days. 2.  Limited quantity of Xanax has been refilled.  You may take 3 times daily as needed. 3.  Use your Albuterol inhaler and/or nebulizer every 4 hours as needed for difficulty breathing. 4.  Return to the ER for worsening symptoms, persistent vomiting, difficulty breathing or other concerns.

## 2021-07-11 NOTE — Consult Note (Signed)
Pharmacy Antibiotic Note  Angela Jensen is a 65 y.o. female recently admitted 11/22-11/24 with symptomatic bradycardia and presyncope that improved/stabilized and was prepared to go home with close f/u. However, upon discharge had a syncopal episode in the parking lot and req'd readmission. Became unresponsive, diaphoretic with agonal breathing req'ing intubation and transfer to ICU with symptomatic bradycardia 2/2 3rd degree AV block  planning PPM. Pharmacy consulted for dosing/monitoring of Unasyn d/t c/f potential Aspiration Pneumonia.  Plan: PCT: 0.12, WBC 8.4>15.1 (now on steroids); Afeb. Planning surgery for permanent pacemaker. Initiate Unasyn 3g IV q6h per current indication/renal fxn. CTM and adjust PRN, F/u cultures and PCR panel.  Height: 4' 11.02" (149.9 cm) Weight: 58.2 kg (128 lb 6.4 oz) IBW/kg (Calculated) : 43.24  Temp (24hrs), Avg:97.8 F (36.6 C), Min:97.1 F (36.2 C), Max:98.5 F (36.9 C)  Recent Labs  Lab 07/08/21 2132 07/09/21 0722 07/10/21 2043 07/11/21 0644 07/11/21 0907  WBC 5.1 4.2 8.4 15.1*  --   CREATININE 0.52 0.52 0.88 0.97  --   LATICACIDVEN  --   --   --  5.9* 4.9*    Estimated Creatinine Clearance: 44.9 mL/min (by C-G formula based on SCr of 0.97 mg/dL).    Allergies  Allergen Reactions   Trelegy Ellipta [Fluticasone-Umeclidin-Vilant]     Antimicrobials this admission: Unasyn (11/25 >>   Dose adjustments this admission: CTM and adjust PRN  Microbiology results: 11/25 BCx: pending 11/25 trach asp: pending  11/25 RVP PCR - pending 11/25 MRSA PCR: negative  Thank you for allowing pharmacy to be a part of this patient's care.  Shanon Brow Havanah Nelms 07/11/2021 12:45 PM

## 2021-07-11 NOTE — Progress Notes (Signed)
ANTICOAGULATION CONSULT NOTE - Initial Consult  Pharmacy Consult for Lovenox Indication:  DVT prophylaxis  Allergies  Allergen Reactions   Trelegy Ellipta [Fluticasone-Umeclidin-Vilant]     Patient Measurements: Height: 4' 11.02" (149.9 cm) Weight: 58.2 kg (128 lb 6.4 oz) IBW/kg (Calculated) : 43.24 Heparin Dosing Weight:   Vital Signs: Temp: 97.1 F (36.2 C) (11/24 2014) Temp Source: Oral (11/24 2014) BP: 122/84 (11/25 0645) Pulse Rate: 101 (11/25 0645)  Labs: Recent Labs    07/08/21 2132 07/09/21 0002 07/09/21 0722 07/10/21 2043 07/10/21 2249  HGB 10.5*  --  9.5* 10.1*  --   HCT 33.6*  --  31.0* 33.4*  --   PLT 236  --  219 207  --   CREATININE 0.52  --  0.52 0.88  --   TROPONINIHS 18* 13  --  25* 34*    Estimated Creatinine Clearance: 49.5 mL/min (by C-G formula based on SCr of 0.88 mg/dL).   Medical History: Past Medical History:  Diagnosis Date   Anemia    Arthritis    Asthma    Atherosclerosis of native arteries of extremity with intermittent claudication ([place]) 05/26/2016   Cancer ([place]) 2012   Right Lung CA   COPD (chronic obstructive pulmonary disease) ([place])    Depression    Diabetes mellitus without complication ([place])    Patient takes Janumet   Essential hypertension 05/26/2016   Hypercholesteremia    Oxygen dependent    2L at nite    PAD (peripheral artery disease) ([place]) 06/22/2016   Peripheral vascular disease ([place])    Personal history of radiation therapy    Shortness of breath dyspnea    with exertion    Sialolithiasis    Sleep apnea    Wears dentures    full upper and lower    Medications:    Assessment:  Goal of Therapy:  Prevention of DVT     Plan:  Lovenox 40 mg SQ Q24H ordered to start 11/25 @ 0800 .   Haddy Mullinax D 07/11/2021,7:03 AM

## 2021-07-11 NOTE — Progress Notes (Signed)
Inpatient Diabetes Program Recommendations  AACE/ADA: New Consensus Statement on Inpatient Glycemic Control (2015)  Target Ranges:  Prepandial:   less than 140 mg/dL      Peak postprandial:   less than 180 mg/dL (1-2 hours)      Critically ill patients:  140 - 180 mg/dL   Lab Results  Component Value Date   GLUCAP 221 (H) 07/11/2021   HGBA1C 6.6 (H) 06/11/2021    Review of Glycemic Control  Latest Reference Range & Units 07/11/21 03:45 07/11/21 06:03 07/11/21 07:23 07/11/21 11:27  Glucose-Capillary 70 - 99 mg/dL 381 (H) 379 (H) 368 (H) 221 (H)  (H): Data is abnormally high Diabetes history: DM2 Outpatient Diabetes medications: Levemir 8-12 units qd, Metformin 500 mg q am Current orders for Inpatient glycemic control: Novolog correction 0-20 units q 4 hrs., Solumedrol 40 mg qd  Inpatient Diabetes Program Recommendations:   -Levemir 12 units qd starting this am -May need Add Novolog 3 units tid meal coverage when eating Secure chat sent to Dr. Lanney Gins.  Thank you, Nani Gasser. Fleta Borgeson, RN, MSN, CDE  Diabetes Coordinator Inpatient Glycemic Control Team Team Pager [phone] (8am-5pm) 07/11/2021 11:33 AM

## 2021-07-11 NOTE — ED Notes (Signed)
Patient discharged to home per MD order. Patient in stable condition, and deemed medically cleared by ED provider for discharge. Discharge instructions reviewed with patient/family using "Teach Back"; verbalized understanding of medication education and administration, and information about follow-up care. Denies further concerns. ° °

## 2021-07-12 ENCOUNTER — Inpatient Hospital Stay: Payer: Medicare Other

## 2021-07-12 LAB — CBC
HCT: 27.8 % — ABNORMAL LOW (ref 36.0–46.0)
Hemoglobin: 8.4 g/dL — ABNORMAL LOW (ref 12.0–15.0)
MCH: 28.7 pg (ref 26.0–34.0)
MCHC: 30.2 g/dL (ref 30.0–36.0)
MCV: 94.9 fL (ref 80.0–100.0)
Platelets: 176 10*3/uL (ref 150–400)
RBC: 2.93 MIL/uL — ABNORMAL LOW (ref 3.87–5.11)
RDW: 14.4 % (ref 11.5–15.5)
WBC: 11.2 10*3/uL — ABNORMAL HIGH (ref 4.0–10.5)
nRBC: 0 % (ref 0.0–0.2)

## 2021-07-12 LAB — GLUCOSE, CAPILLARY
Glucose-Capillary: 85 mg/dL (ref 70–99)
Glucose-Capillary: 62 mg/dL — ABNORMAL LOW (ref 70–99)
Glucose-Capillary: 100 mg/dL — ABNORMAL HIGH (ref 70–99)
Glucose-Capillary: 254 mg/dL — ABNORMAL HIGH (ref 70–99)
Glucose-Capillary: 95 mg/dL (ref 70–99)
Glucose-Capillary: 208 mg/dL — ABNORMAL HIGH (ref 70–99)

## 2021-07-12 LAB — MAGNESIUM: Magnesium: 2.3 mg/dL (ref 1.7–2.4)

## 2021-07-12 LAB — PHOSPHORUS: Phosphorus: 3.5 mg/dL (ref 2.5–4.6)

## 2021-07-12 LAB — BASIC METABOLIC PANEL
Anion gap: 4 — ABNORMAL LOW (ref 5–15)
BUN: 16 mg/dL (ref 8–23)
CO2: 31 mmol/L (ref 22–32)
Calcium: 8 mg/dL — ABNORMAL LOW (ref 8.9–10.3)
Chloride: 107 mmol/L (ref 98–111)
Creatinine, Ser: 0.42 mg/dL — ABNORMAL LOW (ref 0.44–1.00)
GFR, Estimated: >60 mL/min (ref >60)
Glucose, Bld: 98 mg/dL (ref 70–99)
Potassium: 3.7 mmol/L (ref 3.5–5.1)
Sodium: 142 mmol/L (ref 135–145)

## 2021-07-12 LAB — PROCALCITONIN: Procalcitonin: 0.53 ng/mL

## 2021-07-12 MED ORDER — ACETAMINOPHEN 325 MG PO TABS
650.0000 mg | ORAL_TABLET | Freq: Four times a day (QID) | ORAL | Status: DC | PRN
  Administered 2021-07-12 – 2021-07-15 (×3): 650 mg via ORAL
  Filled 2021-07-12 (×3): qty 2

## 2021-07-12 MED ORDER — FUROSEMIDE 10 MG/ML IJ SOLN
40.0000 mg | Freq: Every day | INTRAMUSCULAR | Status: DC
  Administered 2021-07-12 – 2021-07-13 (×2): 40 mg via INTRAVENOUS
  Filled 2021-07-12 (×2): qty 4

## 2021-07-12 MED ORDER — METOPROLOL TARTRATE 25 MG PO TABS
12.5000 mg | ORAL_TABLET | Freq: Two times a day (BID) | ORAL | Status: DC
  Administered 2021-07-12 – 2021-07-15 (×5): 12.5 mg via ORAL
  Filled 2021-07-12 (×5): qty 1

## 2021-07-12 NOTE — Progress Notes (Signed)
PHARMACY CONSULT NOTE - FOLLOW UP  Pharmacy Consult for Electrolyte Monitoring and Replacement   Recent Labs: Potassium (mmol/L)  Date Value  07/12/2021 3.7  04/17/2014 3.9   Magnesium (mg/dL)  Date Value  07/12/2021 2.3   Calcium (mg/dL)  Date Value  07/12/2021 8.0 (L)   Calcium, Total (mg/dL)  Date Value  04/17/2014 9.1   Albumin (g/dL)  Date Value  07/11/2021 3.3 (L)  03/28/2020 4.7  04/17/2014 4.1   Phosphorus (mg/dL)  Date Value  07/12/2021 3.5   Sodium (mmol/L)  Date Value  07/12/2021 142  03/28/2020 137  04/17/2014 141     Assessment: 65yo female recently admitted 11/22-11/24 with symptomatic bradycardia and presyncope that improved/stabilized and was prepared to go home with close f/u. However, upon discharge had a syncopal episode in the parking lot and req'd readmission. 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 Pharmacy consulted for electrolytes mgmt. Now extubated.    Goal of Therapy:  Electrolytes WNL   Plan:  No further repletion req'd at this time.  Recheck electrolytes with AM labs.   Eleonore Chiquito, PharmD, Clinical Pharmacist 07/12/2021 8:32 AM

## 2021-07-12 NOTE — Progress Notes (Signed)
NAME:  Angela Jensen, MRN:  [id number], DOB:  29-Oct-1955, LOS: 1 ADMISSION DATE:  07/10/2021, CONSULTATION DATE:  07/11/2021 REFERRING MD:  Lurline Hare, MD CHIEF COMPLAINT:  Syncope   HPI  65 y.o with significant PMH as below who presented to the ED on 11/24  via EMS with chief complaints of shortness of breath.  Patient was recently admitted on 11/22 with symptomatic bradycardia following presyncopal episode.  She remained hemodynamically stable throughout her admission.  She was evaluated by cardiologist who held her carvedilol with recommendation to avoid further AV nodal blocking agents for BP control.  Her heart rate returned to normal and she remained asymptomatic throughout her admission.  Patient was discharged on 11/24 to follow-up with her cardiologist.  She returned to the ED same day with worsening shortness of breath in the setting of COPD exacerbation.  Chest x-ray showed some patchiness in the right side which is stable.  She was treated with duo nebs and 125 mg of Solu-Medrol with notable improvement.  On reassessment patient looked comfortable with improvement in wheezing.  She was offered readmission however she insisted on being discharged due to improvement in her symptoms.  Patient was given strict return precaution and discharged home to follow-up with her cardiology and pulmonologist.  Unfortunately she had a syncopal episode in the parking lot prompting return back to the ED.   07/12/21- patient extubated and is doing well on room air. Plan for optimization to [facility] service with transfer to PCU.  Cardiology on case - for PPM on Monday.   Past Medical History    Chronic respiratory failure with hypoxia (HCC)     CAP (community acquired pneumonia) 06/26/2021   COPD exacerbation ([place]) 06/11/2021   SOB (shortness of breath) 05/01/2021   HCAP (healthcare-associated pneumonia) 05/01/2021   Chronic combined systolic and diastolic CHF (congestive heart failure) ([place]) 05/01/2021    Acute exacerbation of chronic obstructive pulmonary disease (COPD) ([place]) 04/30/2021   Elevated troponin 04/01/2021   Acute on chronic respiratory failure with hypoxia ([place]) 03/31/2021   Pneumothorax after biopsy 05/03/2020   Pneumothorax on right 05/01/2020   Chest pain on breathing 04/21/2020   Pleuritic chest pain 04/11/2020   Right lower lobe pulmonary nodule 04/02/2020   Iron deficiency anemia 04/02/2020   Pica in adults 04/02/2020   Goals of care, counseling/discussion 03/14/2020   Pain in left wrist 09/24/2019   Encounter for general adult medical examination with abnormal findings 06/28/2019   Elevated total protein 01/22/2019   Uncontrolled type 2 diabetes mellitus with hyperglycemia ([place]) 05/16/2018   Chronic obstructive pulmonary disease ([place]) 05/16/2018   Vitamin D deficiency 05/16/2018   Flu vaccine need 05/16/2018   Dysuria 05/16/2018   Cancer of lower lobe of right lung ([place]) 01/21/2018   Screening for breast cancer 10/12/2017   Personal history of tobacco use, presenting hazards to health 02/03/2017   PAD (peripheral artery disease) ([place]) 06/22/2016   Essential hypertension 05/26/2016   Diabetes ([place]) 05/26/2016   Hyperlipidemia 05/26/2016   Atherosclerosis of native arteries of extremity with intermittent claudication ([place]) 05/26/2016   Uterine leiomyoma 04/30/2016   Elevated CEA 01/31/2016   Special screening for malignant neoplasms, colon     Benign neoplasm of sigmoid colon     Primary lung cancer ([place])     Malignant neoplasm of upper lobe of right lung ([place])    [facility] Events   11/24: Presented to the ED with COPD exacerbation 11/25: Admitted with symptomatic bradycardia secondary to third-degree  AV block status post temporary pacemaker  Consults:  Cardiology  Procedures:  11/25: Intubation 11/25: Temporary pacemaker  Significant Diagnostic Tests:  11/24: Chest Xray>No interval change in bilateral pulmonary opacities compared to the prior  radiograph. No large pleural effusion or pneumothorax. Stable cardiomediastinal silhouette. No acute osseous pathology  Micro Data:  11/23: SARS-CoV-2 PCR> negative 11/23: Influenza PCR> negative 11/25: Blood culture x2> 11/25: Urine Culture> 11/25: MRSA PCR>>   Antimicrobials:  None  OBJECTIVE  Blood pressure 125/77, pulse 90, temperature 98.6 F (37 C), temperature source Oral, resp. rate 18, height 4' 11.02" (1.499 m), weight 58.2 kg, SpO2 100 %.    Vent Mode: Spontaneous FiO2 (%):  [35 %] 35 % Set Rate:  [16 bmp] 16 bmp Vt Set:  [450 mL] 450 mL PEEP:  [5 cmH20] 5 cmH20 Pressure Support:  [5 cmH20] 5 cmH20   Intake/Output Summary (Last 24 hours) at 07/12/2021 1140 Last data filed at 07/12/2021 4010 Gross per 24 hour  Intake 363.02 ml  Output 2100 ml  Net -1736.98 ml   Filed Weights   07/10/21 2010  Weight: 58.2 kg   Physical Examination  GENERAL: 65 Year-old critically ill NAD EYES: Pupils equal, round, reactive to light and accommodation. No scleral icterus. Extraocular muscles intact.  HEENT: Head atraumatic, normocephalic. Oropharynx and nasopharynx clear.  NECK:  Supple, no jugular venous distention. No thyroid enlargement, no tenderness.  LUNGS: Normal breath sounds bilaterally, no wheezing, rales,rhonchi or crepitation. No use of accessory muscles of respiration.  CARDIOVASCULAR: S1, S2 normal. No murmurs, rubs, or gallops.  ABDOMEN: Soft, nontender, nondistended. Bowel sounds present. No organomegaly or mass.  EXTREMITIES: No pedal edema, cyanosis, or clubbing.  NEUROLOGIC: Cranial nerves II through XII are intact.  Muscle strength not checked. Sensation intact. Gait not checked.  PSYCHIATRIC: The patient is intubated and sedated SKIN: No obvious rash, lesion, or ulcer.   Labs/imaging that I havepersonally reviewed  (right click and "Reselect all SmartList Selections" daily)     Labs   CBC: Recent Labs  Lab 07/08/21 2132 07/09/21 0722 07/10/21 2043  07/11/21 0644 07/12/21 0313  WBC 5.1 4.2 8.4 15.1* 11.2*  NEUTROABS 3.5  --  7.2 13.9*  --   HGB 10.5* 9.5* 10.1* 10.4* 8.4*  HCT 33.6* 31.0* 33.4* 34.6* 27.8*  MCV 94.9 95.7 97.1 96.9 94.9  PLT 236 219 207 238 176     Basic Metabolic Panel: Recent Labs  Lab 07/08/21 2132 07/09/21 0722 07/10/21 2043 07/11/21 0644 07/11/21 0645 07/12/21 0313  NA 135 140 138 139  --  142  K 3.7 3.9 4.3 4.6  --  3.7  CL 102 106 103 104  --  107  CO2 _0 --  31  GLUCOSE 163* 172* 319* 401*  --  98  BUN _1 --  16  CREATININE 0.52 0.52 0.88 0.97  --  0.42*  CALCIUM 8.7* 8.2* 8.2* 8.0*  --  8.0*  MG  --   --   --   --  2.3 2.3  PHOS  --   --   --   --  2.5 3.5    GFR: Estimated Creatinine Clearance: 54.5 mL/min (A) (by C-G formula based on SCr of 0.42 mg/dL (L)). Recent Labs  Lab 07/09/21 0722 07/10/21 2043 07/11/21 0644 07/11/21 0645 07/11/21 0907 07/12/21 0313  PROCALCITON  --   --   --  0.12  --  0.53  WBC 4.2 8.4 15.1*  --   --  11.2*  LATICACIDVEN  --   --  5.9*  --  4.9*  --      Liver Function Tests: Recent Labs  Lab 07/08/21 2132 07/10/21 2043 07/11/21 0644  AST 150* 57* 58*  ALT 134* 81* 81*  ALKPHOS 80 71 70  BILITOT 0.6 0.4 0.6  PROT 6.8 6.5 6.4*  ALBUMIN 3.7 3.4* 3.3*    No results for input(s): LIPASE, AMYLASE in the last 168 hours. No results for input(s): AMMONIA in the last 168 hours.  ABG    Component Value Date/Time   PHART 7.34 (L) 07/11/2021 0500   PCO2ART 48 07/11/2021 0500   PO2ART 108 07/11/2021 0500   HCO3 25.9 07/11/2021 0500   ACIDBASEDEF 0.2 07/11/2021 0500   O2SAT 97.9 07/11/2021 0500      Coagulation Profile: No results for input(s): INR, PROTIME in the last 168 hours.  Cardiac Enzymes: No results for input(s): CKTOTAL, CKMB, CKMBINDEX, TROPONINI in the last 168 hours.  HbA1C: Hgb A1c MFr Bld  Date/Time Value Ref Range Status  06/11/2021 01:44 PM 6.6 (H) 4.8 - 5.6 % Final    Comment:    (NOTE) Pre  diabetes:          5.7%-6.4%  Diabetes:              >6.4%  Glycemic control for   <7.0% adults with diabetes   04/02/2021 03:45 AM 8.2 (H) 4.8 - 5.6 % Final    Comment:    (NOTE) Pre diabetes:          5.7%-6.4%  Diabetes:              >6.4%  Glycemic control for   <7.0% adults with diabetes     CBG: Recent Labs  Lab 07/11/21 1933 07/11/21 2336 07/12/21 0419 07/12/21 0743 07/12/21 1107  GLUCAP 117* 138* 85 62* 100*     Review of Systems:   UNABLE TO OBTAIN DUE TO PATIENT CURRENTLY INTUBATED AND SEDATED  Past Medical History  She,  has a past medical history of Anemia, Arthritis, Asthma, Atherosclerosis of native arteries of extremity with intermittent claudication ([place]) (05/26/2016), Cancer ([place]) (2012), COPD (chronic obstructive pulmonary disease) ([place]), Depression, Diabetes mellitus without complication ([place]), Essential hypertension (05/26/2016), Hypercholesteremia, Oxygen dependent, PAD (peripheral artery disease) ([place]) (06/22/2016), Peripheral vascular disease ([place]), Personal history of radiation therapy, Shortness of breath dyspnea, Sialolithiasis, Sleep apnea, and Wears dentures.   Surgical History    Past Surgical History:  Procedure Laterality Date   CESAREAN SECTION     x3   COLONOSCOPY WITH PROPOFOL N/A 06/25/2015   Procedure: COLONOSCOPY WITH PROPOFOL;  Surgeon: Lucilla Lame, MD;  Location: [facility];  Service: Endoscopy;  Laterality: N/A;   COLONOSCOPY WITH PROPOFOL N/A 07/26/2020   Procedure: COLONOSCOPY WITH PROPOFOL;  Surgeon: Lucilla Lame, MD;  Location: [facility];  Service: Endoscopy;  Laterality: N/A;   CYST REMOVAL LEG     and on shoulder    ESOPHAGOGASTRODUODENOSCOPY (EGD) WITH PROPOFOL N/A 07/26/2020   Procedure: ESOPHAGOGASTRODUODENOSCOPY (EGD) WITH PROPOFOL;  Surgeon: Lucilla Lame, MD;  Location: [facility];  Service: Endoscopy;  Laterality: N/A;  Diabetic - oral meds   LOWER EXTREMITY ANGIOGRAPHY Left 09/29/2018    Procedure: LOWER EXTREMITY ANGIOGRAPHY;  Surgeon: Algernon Huxley, MD;  Location: [facility];  Service: Cardiovascular;  Laterality: Left;   LUNG BIOPSY  05 15 2013   has lung "spots"   PERIPHERAL VASCULAR CATHETERIZATION Left 06/01/2016   Procedure: Lower Extremity Angiography;  Surgeon: Algernon Huxley, MD;  Location: [facility];  Service: Cardiovascular;  Laterality: Left;   PERIPHERAL VASCULAR CATHETERIZATION N/A 06/01/2016   Procedure: Abdominal Aortogram w/Lower Extremity;  Surgeon: Algernon Huxley, MD;  Location: [facility];  Service: Cardiovascular;  Laterality: N/A;   PERIPHERAL VASCULAR CATHETERIZATION  06/01/2016   Procedure: Lower Extremity Intervention;  Surgeon: Algernon Huxley, MD;  Location: [facility];  Service: Cardiovascular;;   PERIPHERAL VASCULAR CATHETERIZATION Right 06/08/2016   Procedure: Lower Extremity Angiography;  Surgeon: Algernon Huxley, MD;  Location: [facility];  Service: Cardiovascular;  Laterality: Right;   PERIPHERAL VASCULAR CATHETERIZATION  06/08/2016   Procedure: Lower Extremity Intervention;  Surgeon: Algernon Huxley, MD;  Location: [facility];  Service: Cardiovascular;;   SUBMANDIBULAR GLAND EXCISION Left 12/06/2020   Procedure: EXCISION SUBMANDIBULAR GLAND;  Surgeon: Beverly Gust, MD;  Location: [facility];  Service: ENT;  Laterality: Left;  needs to be first case Diabetic - diet controlled   TEMPORARY PACEMAKER N/A 07/11/2021   Procedure: TEMPORARY PACEMAKER;  Surgeon: Isaias Cowman, MD;  Location: [facility];  Service: Cardiovascular;  Laterality: N/A;     Social History   reports that she quit smoking about 11 years ago. Her smoking use included cigarettes. She has a 37.00 pack-year smoking history. She has quit using smokeless tobacco.  Her smokeless tobacco use included snuff. She reports that she does not currently use alcohol after a past usage of about 5.0 standard drinks per week.  She reports that she does not currently use drugs after having used the following drugs: Marijuana, "Crack" cocaine, and Cocaine.   Family History   Her family history includes Coronary artery disease in her brother; Diabetes in her maternal grandmother, mother, paternal grandmother, sister, and sister; Heart attack in her brother and brother; Hypercholesterolemia in her mother; Hypertension in her sister; Lung cancer in her father; Vascular Disease in her brother.   Allergies Allergies  Allergen Reactions   Trelegy Ellipta [Fluticasone-Umeclidin-Vilant]      Home Medications  Prior to Admission medications   Medication Sig Start Date End Date Taking? Authorizing Provider  predniSONE (DELTASONE) 20 MG tablet 3 tablets daily x 4 days 07/11/21  Yes Paulette Blanch, MD  Accu-Chek Softclix Lancets lancets Use as instructed for twice a daily Dx E11.65 04/11/21   [name], MD  albuterol (PROVENTIL) (2.5 MG/3ML) 0.083% nebulizer solution USE 1 VIAL IN NEBULIZER EVERY 6 HOURS AS NEEDED FOR WHEEZING 06/30/21   [name], MD  albuterol (VENTOLIN HFA) 108 (90 Base) MCG/ACT inhaler INHALE 2 PUFFS BY MOUTH EVERY 6 HOURS AS NEEDED FOR WHEEZING FOR SHORTNESS OF BREATH 06/26/21   [name], MD  ALPRAZolam Duanne Moron) 0.25 MG tablet Take 1 tablet (0.25 mg total) by mouth 3 (three) times daily as needed for anxiety or sleep. 07/11/21   Paulette Blanch, MD  Blood Glucose Monitoring Suppl (ACCU-CHEK GUIDE) w/Device KIT Use as directed Dx e11.65 04/11/21   [name], MD  clopidogrel (PLAVIX) 75 MG tablet Take 1 tablet by mouth once daily 06/22/21   Jonetta Osgood, NP  escitalopram (LEXAPRO) 10 MG tablet Take 1 tablet (10 mg total) by mouth daily. 05/08/21   Ezekiel Slocumb, DO  famotidine (PEPCID) 20 MG tablet Take 1 tablet (20 mg total) by mouth daily for 14 days. 06/14/21 06/28/21  Nolberto Hanlon, MD  ferrous sulfate 325 (65 FE) MG tablet Take 325 mg  by mouth 2 (two) times daily with a meal.    [provider]  fluticasone (FLONASE) 50 MCG/ACT nasal spray Place 2 sprays into both nostrils daily. 05/09/21   Ezekiel Slocumb, DO  gabapentin (NEURONTIN) 100 MG capsule Take 1 capsule (100 mg total) by mouth 2 (two) times daily. 04/08/21   Jonetta Osgood, NP  glucose blood (ACCU-CHEK GUIDE) test strip USE  STRIP TO CHECK GLUCOSE TWICE DAILY 06/08/21   [name], MD  insulin detemir (LEVEMIR FLEXTOUCH) 100 UNIT/ML FlexPen Inject 8- 12 units with supper qd// with needles 04/22/21   [name], MD  lisinopril (ZESTRIL) 10 MG tablet Take 1 tablet (10 mg total) by mouth daily. 05/09/21   Ezekiel Slocumb, DO  metFORMIN (GLUCOPHAGE) 500 MG tablet Take 1 tablet (500 mg total) by mouth daily with breakfast. 03/12/21   [name], MD  mometasone-formoterol [facility]) 200-5 MCG/ACT AERO Inhale 2 puffs into the lungs 2 (two) times daily. 06/17/21   Jonetta Osgood, NP  Omega-3 Fatty Acids (FISH OIL) 1000 MG CAPS Take 1 capsule by mouth daily.    [provider]  OXYGEN Inhale into the lungs.    [provider]  potassium chloride (KLOR-CON) 10 MEQ tablet TAKE 1 TABLET BY MOUTH EVERY OTHER DAY 04/29/21   [name], MD  tiotropium (SPIRIVA HANDIHALER) 18 MCG inhalation capsule INHALE THE CONTENTS OF ONE CAPSULE BY MOUTH ONCE DAILY. DO NOT SWALLOW CAPSULE. 06/17/21   Jonetta Osgood, NP  Scheduled Meds:  albuterol  2.5 mg Nebulization QID   budesonide (PULMICORT) nebulizer solution  0.25 mg Nebulization BID   chlorhexidine  15 mL Mouth Rinse BID   Chlorhexidine Gluconate Cloth  6 each Topical Daily   enoxaparin (LOVENOX) injection  40 mg Subcutaneous Q24H   famotidine  20 mg Oral Daily   insulin aspart  0-20 Units Subcutaneous Q4H   insulin detemir  12 Units Subcutaneous Q2200   mouth rinse  15 mL Mouth Rinse q12n4p   methylPREDNISolone (SOLU-MEDROL) injection  40 mg Intravenous Daily   Continuous Infusions:  sodium chloride     sodium chloride     ampicillin-sulbactam  (UNASYN) IV 3 g (07/12/21 4782)   calcium gluconate      ceFAZolin (ANCEF) IV     DOPamine Stopped (07/11/21 1424)   PRN Meds:.ALPRAZolam, docusate sodium, ipratropium-albuterol, phenol, polyethylene glycol  Assessment & Plan:  Acute Hypoxic Respiratory Failure secondary to COPD exacerbation Severe metabolic acidosis PMHx: COPD on Chronic home 02 at 2L, Asthma, Lung Cancer, OSA      -s/p extubation        - now on room air       - repeat CXR this am       - TRH transfer to PCU   Symptomatic Bradycardia Presenting with HR in the low 30's, hypotension and syncopal episode S/p temporary pacemaker -Continue dopamine 5-20 mcg/kg/min wean as tolerated -Check TSH, UDS -Monitor BMP+Mag -Hold AV nodal drugs -Cardiology consult, input appreciated, plan for permanent place maker next week>?  Chronic systolic CHF (last known EF 45 to 50% on 04/03/21) -Hypertension now Hypotensive in the setting Symptomatic bradycardia Hx: HLD  -Continuous cardiac monitoring -Maintain MAP greater than 65 -Avoid AV nodal blocking drugs consider amlodipine ARB or ACE inhibitor  -Cardiology following, appreciate input -Repeat 2D Echocardiogram                  -Lasix 40 daily IV  Diabetes mellitus now with steroids  induced hyperglycemia -CBGs -Resistant Sliding scale insulin -Follow ICU hyper/hypoglycemia protocol -Hold home Metformin      Best practice:  Diet:  Tube Feed  Pain/Anxiety/Delirium protocol (if indicated): Yes (RASS goal 0) VAP protocol (if indicated): Yes DVT prophylaxis: LMWH GI prophylaxis: H2B Glucose control:  SSI Yes Central venous access:  N/A Arterial line:  N/A Foley:  Yes, and it is still needed Mobility:  bed rest  PT consulted: N/A Last date of multidisciplinary goals of care discussion [11/25] Code Status:  full code Disposition: ICU   = Goals of Care = Code Status Order: ICU  Primary Emergency ContactChanetta Marshall, Home Phone: [phone] Wishes to pursue  full aggressive treatment and intervention options, including CPR and intubation, but goals of care will be addressed on going with family if that should become necessary.   Ottie Glazier, M.D.  Pulmonary & [place]   .

## 2021-07-12 NOTE — Progress Notes (Signed)
[facility] Cardiology  SUBJECTIVE: Patient laying in bed, extubated, denies chest pain or shortness of breath   Vitals:   07/11/21 2000 07/12/21 0000 07/12/21 0400 07/12/21 0714  BP: 133/79 126/78 125/77   Pulse: 98 89 90   Resp: 18 17 18    Temp: 98.5 F (36.9 C) 98.6 F (37 C) 98.6 F (37 C)   TempSrc: Oral Oral Oral   SpO2: 100% 100% 99% 100%  Weight:      Height:         Intake/Output Summary (Last 24 hours) at 07/12/2021 0943 Last data filed at 07/12/2021 1540 Gross per 24 hour  Intake 398.74 ml  Output 2100 ml  Net -1701.26 ml      PHYSICAL EXAM  General: Well developed, well nourished, in no acute distress HEENT:  Normocephalic and atramatic Neck:  No JVD.  Lungs: Clear bilaterally to auscultation and percussion. Heart: HRRR . Normal S1 and S2 without gallops or murmurs.  Abdomen: Bowel sounds are positive, abdomen soft and non-tender  Msk:  Back normal, normal gait. Normal strength and tone for age. Extremities: No clubbing, cyanosis or edema.   Neuro: Alert and oriented X 3. Psych:  Good affect, responds appropriately   LABS: Basic Metabolic Panel: Recent Labs    07/11/21 0644 07/11/21 0645 07/12/21 0313  NA 139  --  142  K 4.6  --  3.7  CL 104  --  107  CO2 27  --  31  GLUCOSE 401*  --  98  BUN 23  --  16  CREATININE 0.97  --  0.42*  CALCIUM 8.0*  --  8.0*  MG  --  2.3 2.3  PHOS  --  2.5 3.5   Liver Function Tests: Recent Labs    07/10/21 2043 07/11/21 0644  AST 57* 58*  ALT 81* 81*  ALKPHOS 71 70  BILITOT 0.4 0.6  PROT 6.5 6.4*  ALBUMIN 3.4* 3.3*   No results for input(s): LIPASE, AMYLASE in the last 72 hours. CBC: Recent Labs    07/10/21 2043 07/11/21 0644 07/12/21 0313  WBC 8.4 15.1* 11.2*  NEUTROABS 7.2 13.9*  --   HGB 10.1* 10.4* 8.4*  HCT 33.4* 34.6* 27.8*  MCV 97.1 96.9 94.9  PLT 207 238 176   Cardiac Enzymes: No results for input(s): CKTOTAL, CKMB, CKMBINDEX, TROPONINI in the last 72 hours. BNP: Invalid input(s):  POCBNP D-Dimer: Recent Labs    07/10/21 2249  DDIMER 1.32*   Hemoglobin A1C: No results for input(s): HGBA1C in the last 72 hours. Fasting Lipid Panel: No results for input(s): CHOL, HDL, LDLCALC, TRIG, CHOLHDL, LDLDIRECT in the last 72 hours. Thyroid Function Tests: No results for input(s): TSH, T4TOTAL, T3FREE, THYROIDAB in the last 72 hours.  Invalid input(s): FREET3 Anemia Panel: No results for input(s): VITAMINB12, FOLATE, FERRITIN, TIBC, IRON, RETICCTPCT in the last 72 hours.  DG Chest 1 View  Result Date: 07/11/2021 CLINICAL DATA:  Pacemaker.  Intubated. EXAM: PORTABLE CHEST 1 VIEW COMPARISON:  Chest XR, 07/11/2021.  CT chest , 07/11/2021. FINDINGS: Support lines: ETT with tip within the mid-to-distal thoracic trachea, unchanged. RIGHT IJ CVC with catheter tip within the RIGHT atrium. NG tube with tip excluded from. Overlying pacer leads. Cardiomediastinal silhouette is unchanged. Well inflated lungs with perihilar and basilar coarse interstitial opacities. No pleural effusion pneumothorax. No osseous abnormality. IMPRESSION: 1. Lines and tubes as above. 2. Unchanged pulmonary findings. Electronically Signed   By: Michaelle Birks M.D.   On: 07/11/2021 08:05  DG Abd 1 View  Result Date: 07/11/2021 CLINICAL DATA:  OG tube placement EXAM: ABDOMEN - 1 VIEW COMPARISON:  None. FINDINGS: Enteric tube passes into the body of the stomach. Included bowel gas pattern is unremarkable. IMPRESSION: Enteric tube within stomach. Electronically Signed   By: Macy Mis M.D.   On: 07/11/2021 08:22   CT Angio Chest PE W and/or Wo Contrast  Result Date: 07/11/2021 CLINICAL DATA:  reports of shortness of breath, pt was discharged from hospital about 3 hours prior to arrival, pt states when she left she was able to eat, but then after while became short of breath. History of lung cancer. EXAM: CT ANGIOGRAPHY CHEST WITH CONTRAST TECHNIQUE: Multidetector CT imaging of the chest was performed using the  standard protocol during bolus administration of intravenous contrast. Multiplanar CT image reconstructions and MIPs were obtained to evaluate the vascular anatomy. CONTRAST:  37mL OMNIPAQUE IOHEXOL 350 MG/ML SOLN COMPARISON:  CT angio chest 06/26/2021, CT angiography chest 05/01/2021, CT angiography chest 03/31/2021, chest x-ray 08/01/2011, CT chest 08/01/2015, CT chest 02/09/2021 FINDINGS: Cardiovascular: Fair opacification of the pulmonary arteries to the segmental level. No evidence of pulmonary embolism. Normal heart size. No significant pericardial effusion. The thoracic aorta is normal in caliber. Mild atherosclerotic plaque of the thoracic aorta. No coronary artery calcifications. Mediastinum/Nodes: No enlarged mediastinal, hilar, or axillary lymph nodes. Thyroid gland, trachea, and esophagus demonstrate no significant findings. Lungs/Pleura: Limited evaluation due to respiratory motion artifact. At least moderate centrilobular emphysematous changes with similar-appearing reticulations along the peripheral right lower lobe. Linear atelectasis versus scarring again noted within the right lower lobe. Difficult to measure but likely interval increase in size and conspicuity over several months of a 0.8 x 1.3 cm nodular-like density along the subpleural right apex and a couple of nodular-like densities along the right major and minor fissures: 1.5 x 1.6 cm (7:43) and 1.7 x 1.1 cm (7:49, 8:17-22). No focal consolidation. No pleural effusion.  No pneumothorax. Upper Abdomen: No acute abnormality. Musculoskeletal: No chest wall abnormality. No suspicious lytic or blastic osseous lesions. No acute displaced fracture. Review of the MIP images confirms the above findings. IMPRESSION: 1. No pulmonary embolus. 2. Slightly increased in size and conspicuity over several months of larger than 1 cm nodular-like densities along the right minor and major fissures in the region of prior radiation therapy. Similarly slight  interval increase in size of the right apex subpleural nodule. Recurrent malignancy cannot be excluded. Additional imaging evaluation or consultation with Pulmonology or Thoracic Surgery recommended. 3. Aortic Atherosclerosis (ICD10-I70.0) and Emphysema (ICD10-J43.9). Electronically Signed   By: Iven Finn M.D.   On: 07/11/2021 01:53   CARDIAC CATHETERIZATION  Result Date: 07/11/2021 Successful placement of transvenous temporary pacemaker via right internal jugular vein   DG Chest Port 1 View  Result Date: 07/12/2021 CLINICAL DATA:  Shortness of breath. EXAM: PORTABLE CHEST 1 VIEW COMPARISON:  Chest radiograph dated 07/11/2021. FINDINGS: Interval removal of endotracheal and enteric tubes. Right IJ pacer lead again noted. Diffuse chronic antral coarsening. No interval change in bilateral mid to lower lung field interstitial densities. No new consolidation, pleural effusion or pneumothorax. Stable cardiomediastinal silhouette. No acute osseous pathology. IMPRESSION: No interval change in the bilateral mid to lower lung field interstitial densities. Electronically Signed   By: Anner Crete M.D.   On: 07/12/2021 00:35   DG Chest Port 1 View  Result Date: 07/11/2021 CLINICAL DATA:  Intubation. EXAM: PORTABLE CHEST 1 VIEW COMPARISON:  Chest radiograph dated 07/10/2021. FINDINGS: Endotracheal  tube with tip approximately 3 cm above the carina. No interval change in bilateral pulmonary opacities compared to the prior radiograph. No large pleural effusion or pneumothorax. Stable cardiomediastinal silhouette. No acute osseous pathology. IMPRESSION: Endotracheal tube with tip above the carina. Electronically Signed   By: Anner Crete M.D.   On: 07/11/2021 03:52   DG Chest Portable 1 View  Result Date: 07/10/2021 CLINICAL DATA:  Rule out pneumonia. EXAM: PORTABLE CHEST 1 VIEW COMPARISON:  Chest radiograph dated 06/26/2021. Chest CT dated 06/26/2021. FINDINGS: Background of emphysema. Overall  similar appearance of bilateral lower lung field airspace densities compared to prior radiograph. No new consolidation. There is no pleural effusion pneumothorax. Stable cardiac silhouette. No acute osseous pathology. IMPRESSION: No significant interval change. Electronically Signed   By: Anner Crete M.D.   On: 07/10/2021 20:29     Echo LVEF 45 to 50%/16/2022  TELEMETRY: Sinus rhythm:  ASSESSMENT AND PLAN:  Principal Problem:   Symptomatic bradycardia    1. Complete heart block, with syncope/collapse, status post temporary transvenous pacemaker right internal jugular vein, currently in sinus rhythm, in the setting of severe metabolic/lactic acidosis 2.  Acute hypoxic respiratory failure secondary to COPD exacerbation, with severe metabolic and lactic acidosis in patient with known COPD, on chronic home O2 therapy, history of lung cancer, extubated, blood cultures negative 3.  Chronic systolic congestive heart failure, LVEF 45 to 50% x 2D echocardiogram 04/03/2021   Recommendations   1.  Agree with current therapy 2.  Continue temporary transvenous pacemaker as backup, set at 70 bpm 3.  Micra AV leadless pacemaker, scheduled for 07/14/2021.  The risk, benefits and alternatives of Micra AV leadless pacemaker were explained to the patient and informed written consent was obtained.  Isaias Cowman, MD, PhD, [facility] 07/12/2021 9:43 AM

## 2021-07-13 DIAGNOSIS — R001 Bradycardia, unspecified: Secondary | ICD-10-CM | POA: Diagnosis not present

## 2021-07-13 LAB — CBC
HCT: 29.9 % — ABNORMAL LOW (ref 36.0–46.0)
Hemoglobin: 9.2 g/dL — ABNORMAL LOW (ref 12.0–15.0)
MCH: 29 pg (ref 26.0–34.0)
MCHC: 30.8 g/dL (ref 30.0–36.0)
MCV: 94.3 fL (ref 80.0–100.0)
Platelets: 185 10*3/uL (ref 150–400)
RBC: 3.17 MIL/uL — ABNORMAL LOW (ref 3.87–5.11)
RDW: 14 % (ref 11.5–15.5)
WBC: 7.6 10*3/uL (ref 4.0–10.5)
nRBC: 0 % (ref 0.0–0.2)

## 2021-07-13 LAB — GLUCOSE, CAPILLARY
Glucose-Capillary: 116 mg/dL — ABNORMAL HIGH (ref 70–99)
Glucose-Capillary: 135 mg/dL — ABNORMAL HIGH (ref 70–99)
Glucose-Capillary: 167 mg/dL — ABNORMAL HIGH (ref 70–99)
Glucose-Capillary: 175 mg/dL — ABNORMAL HIGH (ref 70–99)
Glucose-Capillary: 190 mg/dL — ABNORMAL HIGH (ref 70–99)
Glucose-Capillary: 228 mg/dL — ABNORMAL HIGH (ref 70–99)
Glucose-Capillary: 67 mg/dL — ABNORMAL LOW (ref 70–99)

## 2021-07-13 LAB — BASIC METABOLIC PANEL
Anion gap: 4 — ABNORMAL LOW (ref 5–15)
BUN: 16 mg/dL (ref 8–23)
CO2: 35 mmol/L — ABNORMAL HIGH (ref 22–32)
Calcium: 8.2 mg/dL — ABNORMAL LOW (ref 8.9–10.3)
Chloride: 101 mmol/L (ref 98–111)
Creatinine, Ser: 0.45 mg/dL (ref 0.44–1.00)
GFR, Estimated: >60 mL/min (ref >60)
Glucose, Bld: 81 mg/dL (ref 70–99)
Potassium: 3.3 mmol/L — ABNORMAL LOW (ref 3.5–5.1)
Sodium: 140 mmol/L (ref 135–145)

## 2021-07-13 LAB — MAGNESIUM: Magnesium: 2.4 mg/dL (ref 1.7–2.4)

## 2021-07-13 LAB — PHOSPHORUS: Phosphorus: 4.1 mg/dL (ref 2.5–4.6)

## 2021-07-13 LAB — PROCALCITONIN: Procalcitonin: 0.24 ng/mL

## 2021-07-13 MED ORDER — POTASSIUM CHLORIDE CRYS ER 20 MEQ PO TBCR
40.0000 meq | EXTENDED_RELEASE_TABLET | Freq: Once | ORAL | Status: AC
  Administered 2021-07-13: 11:00:00 40 meq via ORAL
  Filled 2021-07-13: qty 2

## 2021-07-13 MED ORDER — POTASSIUM CHLORIDE 10 MEQ/100ML IV SOLN
10.0000 meq | INTRAVENOUS | Status: AC
  Administered 2021-07-13 (×4): 10 meq via INTRAVENOUS
  Filled 2021-07-13 (×4): qty 100

## 2021-07-13 NOTE — Progress Notes (Addendum)
Progress Note    Angela Jensen  INO:[id number] DOB: 07-12-1956  DOA: 07/10/2021 PCP: [name], MD      Brief Narrative:    Medical records reviewed and are as summarized below:  Angela Jensen is a 65 y.o. female medical history significant for COPD with chronic respiratory failure on 2.5 L of oxygen, chronic systolic CHF, hypertension, history of Lung CA (  RUL adenocarcinoma s/p radiation 2013 and 2021), depressison, DM, PVD.  She presented to the hospital with dizziness, lightheadedness and syncope.      Assessment/Plan:   Principal Problem:   Symptomatic bradycardia   Nutrition Problem: Inadequate oral intake Etiology: inability to eat (pt sedated and ventilated)  Signs/Symptoms: NPO status   Body mass index is 25.95 kg/m.   Complete heart block Intermittent bradycardia, s/p syncope: S/p transvenous pacemaker.  Plan for permanent pacemaker placement tomorrow.  COPD exacerbation: Continue steroids and bronchodilators.  Acute hypoxemic respiratory failure: Continue 2 L/min oxygen via nasal cannula and taper off as able.  Chronic systolic CHF, hypertension: Discontinue IV Lasix.  EF 45 to 50%  Insulin-dependent diabetes mellitus, episodes of hypoglycemia: Hold Levemir tonight.  She will be n.p.o. after midnight for pacemaker tomorrow.  Hypokalemia: Replete potassium and monitor  Diet Order             Diet Carb Modified Fluid consistency: Thin; Room service appropriate? Yes  Diet effective now                      Consultants: Cardiologist Intensivist  Procedures: None    Medications:    albuterol  2.5 mg Nebulization QID   budesonide (PULMICORT) nebulizer solution  0.25 mg Nebulization BID   chlorhexidine  15 mL Mouth Rinse BID   Chlorhexidine Gluconate Cloth  6 each Topical Daily   enoxaparin (LOVENOX) injection  40 mg Subcutaneous Q24H   famotidine  20 mg Oral Daily   furosemide  40 mg Intravenous Daily   insulin aspart   0-20 Units Subcutaneous Q4H   insulin detemir  12 Units Subcutaneous Q2200   mouth rinse  15 mL Mouth Rinse q12n4p   methylPREDNISolone (SOLU-MEDROL) injection  40 mg Intravenous Daily   metoprolol tartrate  12.5 mg Oral BID   Continuous Infusions:  ampicillin-sulbactam (UNASYN) IV 3 g (07/13/21 1229)   calcium gluconate     DOPamine Stopped (07/11/21 1424)   potassium chloride 10 mEq (07/13/21 1538)     Anti-infectives (From admission, onward)    Start     Dose/Rate Route Frequency Ordered Stop   07/11/21 1315  ceFAZolin (ANCEF) IVPB 2g/100 mL premix        2 g 200 mL/hr over 30 Minutes Intravenous On call 07/11/21 1228 07/12/21 1315   07/11/21 1230  Ampicillin-Sulbactam (UNASYN) 3 g in sodium chloride 0.9 % 100 mL IVPB        3 g 200 mL/hr over 30 Minutes Intravenous Every 6 hours 07/11/21 1141                Family Communication/Anticipated D/C date and plan/Code Status   DVT prophylaxis: enoxaparin (LOVENOX) injection 40 mg Start: 07/11/21 0800 SCDs Start: 07/11/21 0413     Code Status: Full Code  Family Communication: None Disposition Plan: Plan to discharge home when cleared by cardiologist   Status is: Inpatient  Remains inpatient appropriate because: Plan for pacemaker placement tomorrow           Subjective:  Interval events noted.  No chest pain, shortness of breath, palpitations or dizziness  Objective:    Vitals:   07/13/21 1121 07/13/21 1200 07/13/21 1300 07/13/21 1500  BP:  (!) 160/95 (!) 142/92   Pulse:  (!) 108 92   Resp:  19 13   Temp:      TempSrc:      SpO2: 99% 97% 97% 96%  Weight:      Height:       No data found.   Intake/Output Summary (Last 24 hours) at 07/13/2021 1605 Last data filed at 07/13/2021 1253 Gross per 24 hour  Intake 600 ml  Output 6775 ml  Net -6175 ml   Filed Weights   07/10/21 2010 07/13/21 0446  Weight: 58.2 kg 58.3 kg    Exam:  GEN: NAD SKIN: Warm and dry EYES: No pallor or  icterus ENT: MMM CV: RRR PULM: CTA B ABD: soft, ND, NT, +BS CNS: AAO x 3, non focal EXT: No edema or tenderness        Data Reviewed:   I have personally reviewed following labs and imaging studies:  Labs: Labs show the following:   Basic Metabolic Panel: Recent Labs  Lab 07/09/21 0722 07/10/21 2043 07/11/21 0644 07/11/21 0645 07/12/21 0313 07/13/21 0506  NA 140 138 139  --  142 140  K 3.9 4.3 4.6  --  3.7 3.3*  CL 106 103 104  --  107 101  CO2 30 30 27   --  31 35*  GLUCOSE 172* 319* 401*  --  98 81  BUN 14 21 23   --  16 16  CREATININE 0.52 0.88 0.97  --  0.42* 0.45  CALCIUM 8.2* 8.2* 8.0*  --  8.0* 8.2*  MG  --   --   --  2.3 2.3 2.4  PHOS  --   --   --  2.5 3.5 4.1   GFR Estimated Creatinine Clearance: 54.5 mL/min (by C-G formula based on SCr of 0.45 mg/dL). Liver Function Tests: Recent Labs  Lab 07/08/21 2132 07/10/21 2043 07/11/21 0644  AST 150* 57* 58*  ALT 134* 81* 81*  ALKPHOS 80 71 70  BILITOT 0.6 0.4 0.6  PROT 6.8 6.5 6.4*  ALBUMIN 3.7 3.4* 3.3*   No results for input(s): LIPASE, AMYLASE in the last 168 hours. No results for input(s): AMMONIA in the last 168 hours. Coagulation profile No results for input(s): INR, PROTIME in the last 168 hours.  CBC: Recent Labs  Lab 07/08/21 2132 07/09/21 0722 07/10/21 2043 07/11/21 0644 07/12/21 0313 07/13/21 0506  WBC 5.1 4.2 8.4 15.1* 11.2* 7.6  NEUTROABS 3.5  --  7.2 13.9*  --   --   HGB 10.5* 9.5* 10.1* 10.4* 8.4* 9.2*  HCT 33.6* 31.0* 33.4* 34.6* 27.8* 29.9*  MCV 94.9 95.7 97.1 96.9 94.9 94.3  PLT 236 219 207 238 176 185   Cardiac Enzymes: No results for input(s): CKTOTAL, CKMB, CKMBINDEX, TROPONINI in the last 168 hours. BNP (last 3 results) No results for input(s): PROBNP in the last 8760 hours. CBG: Recent Labs  Lab 07/13/21 0324 07/13/21 0737 07/13/21 0838 07/13/21 1124 07/13/21 1548  GLUCAP 116* 67* 135* 167* 175*   D-Dimer: Recent Labs    07/10/21 2249  DDIMER 1.32*    Hgb A1c: No results for input(s): HGBA1C in the last 72 hours. Lipid Profile: No results for input(s): CHOL, HDL, LDLCALC, TRIG, CHOLHDL, LDLDIRECT in the last 72 hours. Thyroid function studies: No results for  input(s): TSH, T4TOTAL, T3FREE, THYROIDAB in the last 72 hours.  Invalid input(s): FREET3 Anemia work up: No results for input(s): VITAMINB12, FOLATE, FERRITIN, TIBC, IRON, RETICCTPCT in the last 72 hours. Sepsis Labs: Recent Labs  Lab 07/10/21 2043 07/11/21 0644 07/11/21 0645 07/11/21 0907 07/12/21 0313 07/13/21 0506  PROCALCITON  --   --  0.12  --  0.53 0.24  WBC 8.4 15.1*  --   --  11.2* 7.6  LATICACIDVEN  --  5.9*  --  4.9*  --   --     Microbiology Recent Results (from the past 240 hour(s))  Resp Panel by RT-PCR (Flu A&B, Covid) Nasopharyngeal Swab     Status: None   Collection Time: 07/09/21  1:19 AM   Specimen: Nasopharyngeal Swab; Nasopharyngeal(NP) swabs in vial transport medium  Result Value Ref Range Status   SARS Coronavirus 2 by RT PCR NEGATIVE NEGATIVE Final    Comment: (NOTE) SARS-CoV-2 target nucleic acids are NOT DETECTED.  The SARS-CoV-2 RNA is generally detectable in upper respiratory specimens during the acute phase of infection. The lowest concentration of SARS-CoV-2 viral copies this assay can detect is 138 copies/mL. A negative result does not preclude SARS-Cov-2 infection and should not be used as the sole basis for treatment or other patient management decisions. A negative result may occur with  improper specimen collection/handling, submission of specimen other than nasopharyngeal swab, presence of viral mutation(s) within the areas targeted by this assay, and inadequate number of viral copies(<138 copies/mL). A negative result must be combined with clinical observations, patient history, and epidemiological information. The expected result is Negative.  Fact Sheet for Patients:  EntrepreneurPulse.com.au  Fact  Sheet for Healthcare Providers:  IncredibleEmployment.be  This test is no t yet approved or cleared by the [place] FDA and  has been authorized for detection and/or diagnosis of SARS-CoV-2 by FDA under an Emergency Use Authorization (EUA). This EUA will remain  in effect (meaning this test can be used) for the duration of the COVID-19 declaration under Section 564(b)(1) of the Act, 21 U.S.C.section 360bbb-3(b)(1), unless the authorization is terminated  or revoked sooner.       Influenza A by PCR NEGATIVE NEGATIVE Final   Influenza B by PCR NEGATIVE NEGATIVE Final    Comment: (NOTE) The Xpert Xpress SARS-CoV-2/FLU/RSV plus assay is intended as an aid in the diagnosis of influenza from Nasopharyngeal swab specimens and should not be used as a sole basis for treatment. Nasal washings and aspirates are unacceptable for Xpert Xpress SARS-CoV-2/FLU/RSV testing.  Fact Sheet for Patients: EntrepreneurPulse.com.au  Fact Sheet for Healthcare Providers: IncredibleEmployment.be  This test is not yet approved or cleared by the [place] FDA and has been authorized for detection and/or diagnosis of SARS-CoV-2 by FDA under an Emergency Use Authorization (EUA). This EUA will remain in effect (meaning this test can be used) for the duration of the COVID-19 declaration under Section 564(b)(1) of the Act, 21 U.S.C. section 360bbb-3(b)(1), unless the authorization is terminated or revoked.  Performed at [facility], [address] [place], [place] [address]   MRSA Next Gen by PCR, Nasal     Status: None   Collection Time: 07/11/21  6:21 AM   Specimen: Nasal Mucosa; Nasal Swab  Result Value Ref Range Status   MRSA by PCR Next Gen NOT DETECTED NOT DETECTED Final    Comment: (NOTE) The GeneXpert MRSA Assay (FDA approved for NASAL specimens only), is one component of a comprehensive MRSA colonization  surveillance program.  It is not intended to diagnose MRSA infection nor to guide or monitor treatment for MRSA infections. Test performance is not FDA approved in patients less than 72 years old. Performed at [facility], [address] [place], [place] [address]   CULTURE, BLOOD (ROUTINE X 2) w Reflex to ID Panel     Status: None (Preliminary result)   Collection Time: 07/11/21  6:43 AM   Specimen: BLOOD  Result Value Ref Range Status   Specimen Description BLOOD RIGHT [facility]  Final   Special Requests   Final    BOTTLES DRAWN AEROBIC AND ANAEROBIC Blood Culture adequate volume   Culture   Final    NO GROWTH 2 DAYS Performed at [facility], [address], [place], [place] [address]    Report Status PENDING  Incomplete  CULTURE, BLOOD (ROUTINE X 2) w Reflex to ID Panel     Status: None (Preliminary result)   Collection Time: 07/11/21  6:52 AM   Specimen: BLOOD  Result Value Ref Range Status   Specimen Description BLOOD RIGHT ARM  Final   Special Requests   Final    BOTTLES DRAWN AEROBIC AND ANAEROBIC Blood Culture adequate volume   Culture   Final    NO GROWTH 2 DAYS Performed at [facility], [address] [place], [place] [address]    Report Status PENDING  Incomplete  Culture, Respiratory w Gram Stain     Status: None (Preliminary result)   Collection Time: 07/11/21 12:23 PM   Specimen: Tracheal Aspirate; Respiratory  Result Value Ref Range Status   Specimen Description   Final    TRACHEAL ASPIRATE Performed at [facility], [address] [place], [place] [address]    Special Requests   Final    NONE Performed at [facility], [address] [place], [place] [address]    Gram Stain   Final    FEW WBC PRESENT, PREDOMINANTLY PMN NO ORGANISMS SEEN    Culture   Final    RARE PSEUDOMONAS AERUGINOSA SUSCEPTIBILITIES TO FOLLOW Performed at [facility], [place] [address], [place], [place] [address]    Report  Status PENDING  Incomplete  Respiratory (~20 pathogens) panel by PCR     Status: None   Collection Time: 07/11/21 12:23 PM   Specimen: Nasopharyngeal Swab; Respiratory  Result Value Ref Range Status   Adenovirus NOT DETECTED NOT DETECTED Final   Coronavirus 229E NOT DETECTED NOT DETECTED Final    Comment: (NOTE) The Coronavirus on the Respiratory Panel, DOES NOT test for the novel  Coronavirus (2019 nCoV)    Coronavirus HKU1 NOT DETECTED NOT DETECTED Final   Coronavirus NL63 NOT DETECTED NOT DETECTED Final   Coronavirus OC43 NOT DETECTED NOT DETECTED Final   Metapneumovirus NOT DETECTED NOT DETECTED Final   Rhinovirus / Enterovirus NOT DETECTED NOT DETECTED Final   Influenza A NOT DETECTED NOT DETECTED Final   Influenza B NOT DETECTED NOT DETECTED Final   Parainfluenza Virus 1 NOT DETECTED NOT DETECTED Final   Parainfluenza Virus 2 NOT DETECTED NOT DETECTED Final   Parainfluenza Virus 3 NOT DETECTED NOT DETECTED Final   Parainfluenza Virus 4 NOT DETECTED NOT DETECTED Final   Respiratory Syncytial Virus NOT DETECTED NOT DETECTED Final   Bordetella pertussis NOT DETECTED NOT DETECTED Final   Bordetella Parapertussis NOT DETECTED NOT DETECTED Final   Chlamydophila pneumoniae NOT DETECTED NOT DETECTED Final   Mycoplasma pneumoniae NOT DETECTED NOT DETECTED Final  Comment: Performed at [facility], [place] [address], [place], [place] [address]  CULTURE, BLOOD (ROUTINE X 2) w Reflex to ID Panel     Status: None (Preliminary result)   Collection Time: 07/11/21  1:44 PM   Specimen: BLOOD  Result Value Ref Range Status   Specimen Description BLOOD RIGHT ANTECUBITAL  Final   Special Requests   Final    BOTTLES DRAWN AEROBIC AND ANAEROBIC Blood Culture adequate volume   Culture   Final    NO GROWTH 2 DAYS Performed at [facility], [address] [place], [place] [address]    Report Status PENDING  Incomplete  CULTURE, BLOOD (ROUTINE X 2) w Reflex to ID Panel     Status:  None (Preliminary result)   Collection Time: 07/11/21  1:49 PM   Specimen: BLOOD  Result Value Ref Range Status   Specimen Description BLOOD BLOOD LEFT HAND  Final   Special Requests   Final    BOTTLES DRAWN AEROBIC AND ANAEROBIC Blood Culture adequate volume   Culture   Final    NO GROWTH 2 DAYS Performed at [facility], [address] [place],  [address]    Report Status PENDING  Incomplete    Procedures and diagnostic studies:  DG Chest Port 1 View  Result Date: 07/12/2021 CLINICAL DATA:  Shortness of breath. EXAM: PORTABLE CHEST 1 VIEW COMPARISON:  Chest radiograph dated 07/11/2021. FINDINGS: Interval removal of endotracheal and enteric tubes. Right IJ pacer lead again noted. Diffuse chronic antral coarsening. No interval change in bilateral mid to lower lung field interstitial densities. No new consolidation, pleural effusion or pneumothorax. Stable cardiomediastinal silhouette. No acute osseous pathology. IMPRESSION: No interval change in the bilateral mid to lower lung field interstitial densities. Electronically Signed   By: Anner Crete M.D.   On: 07/12/2021 00:35               LOS: 2 days   Teneka Malmberg  [facility]   Pager on [email or web address]. If 7PM-7AM, please contact night-coverage at [email or web address]     07/13/2021, 4:05 PM

## 2021-07-13 NOTE — Progress Notes (Signed)
[facility]  SUBJECTIVE: Patient laying in bed, denies chest pain or shortness of breath   Vitals:   07/13/21 0500 07/13/21 0600 07/13/21 0700 07/13/21 0722  BP: (!) 149/102 (!) 147/83 (!) 145/85   Pulse: 80 72 82   Resp: 14 15 18    Temp:      TempSrc:      SpO2: 100% 100% 99% 100%  Weight:      Height:         Intake/Output Summary (Last 24 hours) at 07/13/2021 0910 Last data filed at 07/13/2021 0600 Gross per 24 hour  Intake --  Output 4450 ml  Net -4450 ml      PHYSICAL EXAM  General: Well developed, well nourished, in no acute distress HEENT:  Normocephalic and atramatic Neck:  No JVD.  Lungs: Clear bilaterally to auscultation and percussion. Heart: HRRR . Normal S1 and S2 without gallops or murmurs.  Abdomen: Bowel sounds are positive, abdomen soft and non-tender  Msk:  Back normal, normal gait. Normal strength and tone for age. Extremities: No clubbing, cyanosis or edema.   Neuro: Alert and oriented X 3. Psych:  Good affect, responds appropriately   LABS: Basic Metabolic Panel: Recent Labs    07/12/21 0313 07/13/21 0506  NA 142 140  K 3.7 3.3*  CL 107 101  CO2 31 35*  GLUCOSE 98 81  BUN 16 16  CREATININE 0.42* 0.45  CALCIUM 8.0* 8.2*  MG 2.3 2.4  PHOS 3.5 4.1   Liver Function Tests: Recent Labs    07/10/21 2043 07/11/21 0644  AST 57* 58*  ALT 81* 81*  ALKPHOS 71 70  BILITOT 0.4 0.6  PROT 6.5 6.4*  ALBUMIN 3.4* 3.3*   No results for input(s): LIPASE, AMYLASE in the last 72 hours. CBC: Recent Labs    07/10/21 2043 07/11/21 0644 07/12/21 0313 07/13/21 0506  WBC 8.4 15.1* 11.2* 7.6  NEUTROABS 7.2 13.9*  --   --   HGB 10.1* 10.4* 8.4* 9.2*  HCT 33.4* 34.6* 27.8* 29.9*  MCV 97.1 96.9 94.9 94.3  PLT 207 238 176 185   Cardiac Enzymes: No results for input(s): CKTOTAL, CKMB, CKMBINDEX, TROPONINI in the last 72 hours. BNP: Invalid input(s): POCBNP D-Dimer: Recent Labs    07/10/21 2249  DDIMER 1.32*   Hemoglobin A1C: No  results for input(s): HGBA1C in the last 72 hours. Fasting Lipid Panel: No results for input(s): CHOL, HDL, LDLCALC, TRIG, CHOLHDL, LDLDIRECT in the last 72 hours. Thyroid Function Tests: No results for input(s): TSH, T4TOTAL, T3FREE, THYROIDAB in the last 72 hours.  Invalid input(s): FREET3 Anemia Panel: No results for input(s): VITAMINB12, FOLATE, FERRITIN, TIBC, IRON, RETICCTPCT in the last 72 hours.  DG Chest Port 1 View  Result Date: 07/12/2021 CLINICAL DATA:  Shortness of breath. EXAM: PORTABLE CHEST 1 VIEW COMPARISON:  Chest radiograph dated 07/11/2021. FINDINGS: Interval removal of endotracheal and enteric tubes. Right IJ pacer lead again noted. Diffuse chronic antral coarsening. No interval change in bilateral mid to lower lung field interstitial densities. No new consolidation, pleural effusion or pneumothorax. Stable cardiomediastinal silhouette. No acute osseous pathology. IMPRESSION: No interval change in the bilateral mid to lower lung field interstitial densities. Electronically Signed   By: Anner Crete M.D.   On: 07/12/2021 00:35     Echo LVEF 45 to 50% 04/03/2021  TELEMETRY: Sinus rhythm:  ASSESSMENT AND PLAN:  Principal Problem:   Symptomatic bradycardia    1. Complete heart block, with syncope/collapse, status post temporary transvenous pacemaker  right internal jugular vein, currently in sinus rhythm, in the setting of severe metabolic/lactic acidosis 2.  Acute hypoxic respiratory failure secondary to COPD exacerbation, with severe metabolic and lactic acidosis in patient with known COPD, on chronic home O2 therapy, history of lung cancer, extubated, blood cultures negative 3.  Chronic systolic congestive heart failure, LVEF 45 to 50% by 2D echocardiogram 04/03/2021   Recommendations   1.  Agree with current therapy 2.  Continue temporary transvenous pacemaker as backup, set at 70 bpm 3.  Micra AV leadless pacemaker, scheduled for 07/14/2021.  The risk,  benefits and alternatives of Micra AV leadless pacemaker were explained to the patient and informed written consent was obtained.   Isaias Cowman, MD, PhD, [facility] 07/13/2021 9:10 AM

## 2021-07-13 NOTE — Progress Notes (Signed)
NAME:  Angela Jensen, MRN:  [id number], DOB:  01/30/1956, LOS: 2 ADMISSION DATE:  07/10/2021, CONSULTATION DATE:  07/11/2021 REFERRING MD:  Lurline Hare, MD CHIEF COMPLAINT:  Syncope   HPI  65 y.o with significant PMH as below who presented to the ED on 11/24  via EMS with chief complaints of shortness of breath.  Patient was recently admitted on 11/22 with symptomatic bradycardia following presyncopal episode.  She remained hemodynamically stable throughout her admission.  She was evaluated by cardiologist who held her carvedilol with recommendation to avoid further AV nodal blocking agents for BP control.  Her heart rate returned to normal and she remained asymptomatic throughout her admission.  Patient was discharged on 11/24 to follow-up with her cardiologist.  She returned to the ED same day with worsening shortness of breath in the setting of COPD exacerbation.  Chest x-ray showed some patchiness in the right side which is stable.  She was treated with duo nebs and 125 mg of Solu-Medrol with notable improvement.  On reassessment patient looked comfortable with improvement in wheezing.  She was offered readmission however she insisted on being discharged due to improvement in her symptoms.  Patient was given strict return precaution and discharged home to follow-up with her cardiology and pulmonologist.  Unfortunately she had a syncopal episode in the parking lot prompting return back to the ED.   07/12/21- patient extubated and is doing well on room air. Plan for optimization to [facility] service with transfer to PCU.  Cardiology on case - for PPM on Monday.  07/13/21-  patient is stable for PCU transfer with plan for procedure with cardiology in AM for leadless pacemaker placement.   Past Medical History    Chronic respiratory failure with hypoxia (HCC)     CAP (community acquired pneumonia) 06/26/2021   COPD exacerbation ([place]) 06/11/2021   SOB (shortness of breath) 05/01/2021   HCAP  (healthcare-associated pneumonia) 05/01/2021   Chronic combined systolic and diastolic CHF (congestive heart failure) ([place]) 05/01/2021   Acute exacerbation of chronic obstructive pulmonary disease (COPD) ([place]) 04/30/2021   Elevated troponin 04/01/2021   Acute on chronic respiratory failure with hypoxia ([place]) 03/31/2021   Pneumothorax after biopsy 05/03/2020   Pneumothorax on right 05/01/2020   Chest pain on breathing 04/21/2020   Pleuritic chest pain 04/11/2020   Right lower lobe pulmonary nodule 04/02/2020   Iron deficiency anemia 04/02/2020   Pica in adults 04/02/2020   Goals of care, counseling/discussion 03/14/2020   Pain in left wrist 09/24/2019   Encounter for general adult medical examination with abnormal findings 06/28/2019   Elevated total protein 01/22/2019   Uncontrolled type 2 diabetes mellitus with hyperglycemia ([place]) 05/16/2018   Chronic obstructive pulmonary disease ([place]) 05/16/2018   Vitamin D deficiency 05/16/2018   Flu vaccine need 05/16/2018   Dysuria 05/16/2018   Cancer of lower lobe of right lung ([place]) 01/21/2018   Screening for breast cancer 10/12/2017   Personal history of tobacco use, presenting hazards to health 02/03/2017   PAD (peripheral artery disease) ([place]) 06/22/2016   Essential hypertension 05/26/2016   Diabetes ([place]) 05/26/2016   Hyperlipidemia 05/26/2016   Atherosclerosis of native arteries of extremity with intermittent claudication ([place]) 05/26/2016   Uterine leiomyoma 04/30/2016   Elevated CEA 01/31/2016   Special screening for malignant neoplasms, colon     Benign neoplasm of sigmoid colon     Primary lung cancer ([place])     Malignant neoplasm of upper lobe of right lung ([place])  Significant Hospital Events   11/24: Presented to the ED with COPD exacerbation 11/25: Admitted with symptomatic bradycardia secondary to third-degree AV block status post temporary pacemaker  Consults:  Cardiology  Procedures:  11/25: Intubation 11/25:  Temporary pacemaker  Significant Diagnostic Tests:  11/24: Chest Xray>No interval change in bilateral pulmonary opacities compared to the prior radiograph. No large pleural effusion or pneumothorax. Stable cardiomediastinal silhouette. No acute osseous pathology  Micro Data:  11/23: SARS-CoV-2 PCR> negative 11/23: Influenza PCR> negative 11/25: Blood culture x2> 11/25: Urine Culture> 11/25: MRSA PCR>>   Antimicrobials:  None  OBJECTIVE  Blood pressure (!) 145/85, pulse 82, temperature 98 F (36.7 C), temperature source Oral, resp. rate 18, height 4' 11.02" (1.499 m), weight 58.3 kg, SpO2 99 %.        Intake/Output Summary (Last 24 hours) at 07/13/2021 1137 Last data filed at 07/13/2021 0600 Gross per 24 hour  Intake --  Output 4450 ml  Net -4450 ml    Filed Weights   07/10/21 2010 07/13/21 0446  Weight: 58.2 kg 58.3 kg   Physical Examination  GENERAL: 64 Year-old critically ill NAD EYES: Pupils equal, round, reactive to light and accommodation. No scleral icterus. Extraocular muscles intact.  HEENT: Head atraumatic, normocephalic. Oropharynx and nasopharynx clear.  NECK:  Supple, no jugular venous distention. No thyroid enlargement, no tenderness.  LUNGS: Normal breath sounds bilaterally, no wheezing, rales,rhonchi or crepitation. No use of accessory muscles of respiration.  CARDIOVASCULAR: S1, S2 normal. No murmurs, rubs, or gallops.  ABDOMEN: Soft, nontender, nondistended. Bowel sounds present. No organomegaly or mass.  EXTREMITIES: No pedal edema, cyanosis, or clubbing.  NEUROLOGIC: Cranial nerves II through XII are intact.  Muscle strength not checked. Sensation intact. Gait not checked.  PSYCHIATRIC: The patient is intubated and sedated SKIN: No obvious rash, lesion, or ulcer.   Labs/imaging that I havepersonally reviewed  (right click and "Reselect all SmartList Selections" daily)     Labs   CBC: Recent Labs  Lab 07/08/21 2132 07/09/21 0722 07/10/21 2043  07/11/21 0644 07/12/21 0313 07/13/21 0506  WBC 5.1 4.2 8.4 15.1* 11.2* 7.6  NEUTROABS 3.5  --  7.2 13.9*  --   --   HGB 10.5* 9.5* 10.1* 10.4* 8.4* 9.2*  HCT 33.6* 31.0* 33.4* 34.6* 27.8* 29.9*  MCV 94.9 95.7 97.1 96.9 94.9 94.3  PLT 236 219 207 238 176 185     Basic Metabolic Panel: Recent Labs  Lab 07/09/21 0722 07/10/21 2043 07/11/21 0644 07/11/21 0645 07/12/21 0313 07/13/21 0506  NA 140 138 139  --  142 140  K 3.9 4.3 4.6  --  3.7 3.3*  CL 106 103 104  --  107 101  CO2 '30 30 27  ' --  31 35*  GLUCOSE 172* 319* 401*  --  98 81  BUN '14 21 23  ' --  16 16  CREATININE 0.52 0.88 0.97  --  0.42* 0.45  CALCIUM 8.2* 8.2* 8.0*  --  8.0* 8.2*  MG  --   --   --  2.3 2.3 2.4  PHOS  --   --   --  2.5 3.5 4.1    GFR: Estimated Creatinine Clearance: 54.5 mL/min (by C-G formula based on SCr of 0.45 mg/dL). Recent Labs  Lab 07/10/21 2043 07/11/21 0644 07/11/21 0645 07/11/21 0907 07/12/21 0313 07/13/21 0506  PROCALCITON  --   --  0.12  --  0.53 0.24  WBC 8.4 15.1*  --   --  11.2* 7.6  LATICACIDVEN  --  5.9*  --  4.9*  --   --      Liver Function Tests: Recent Labs  Lab 07/08/21 2132 07/10/21 2043 07/11/21 0644  AST 150* 57* 58*  ALT 134* 81* 81*  ALKPHOS 80 71 70  BILITOT 0.6 0.4 0.6  PROT 6.8 6.5 6.4*  ALBUMIN 3.7 3.4* 3.3*    No results for input(s): LIPASE, AMYLASE in the last 168 hours. No results for input(s): AMMONIA in the last 168 hours.  ABG    Component Value Date/Time   PHART 7.34 (L) 07/11/2021 0500   PCO2ART 48 07/11/2021 0500   PO2ART 108 07/11/2021 0500   HCO3 25.9 07/11/2021 0500   ACIDBASEDEF 0.2 07/11/2021 0500   O2SAT 97.9 07/11/2021 0500      Coagulation Profile: No results for input(s): INR, PROTIME in the last 168 hours.  Cardiac Enzymes: No results for input(s): CKTOTAL, CKMB, CKMBINDEX, TROPONINI in the last 168 hours.  HbA1C: Hgb A1c MFr Bld  Date/Time Value Ref Range Status  06/11/2021 01:44 PM 6.6 (H) 4.8 - 5.6 % Final     Comment:    (NOTE) Pre diabetes:          5.7%-6.4%  Diabetes:              >6.4%  Glycemic control for   <7.0% adults with diabetes   04/02/2021 03:45 AM 8.2 (H) 4.8 - 5.6 % Final    Comment:    (NOTE) Pre diabetes:          5.7%-6.4%  Diabetes:              >6.4%  Glycemic control for   <7.0% adults with diabetes     CBG: Recent Labs  Lab 07/12/21 2359 07/13/21 0324 07/13/21 0737 07/13/21 0838 07/13/21 1124  GLUCAP 228* 116* 67* 135* 167*     Review of Systems:   UNABLE TO OBTAIN DUE TO PATIENT CURRENTLY INTUBATED AND SEDATED  Past Medical History  She,  has a past medical history of Anemia, Arthritis, Asthma, Atherosclerosis of native arteries of extremity with intermittent claudication ([place]) (05/26/2016), Cancer ([place]) (2012), COPD (chronic obstructive pulmonary disease) ([place]), Depression, Diabetes mellitus without complication ([place]), Essential hypertension (05/26/2016), Hypercholesteremia, Oxygen dependent, PAD (peripheral artery disease) ([place]) (06/22/2016), Peripheral vascular disease ([place]), Personal history of radiation therapy, Shortness of breath dyspnea, Sialolithiasis, Sleep apnea, and Wears dentures.   Surgical History    Past Surgical History:  Procedure Laterality Date   CESAREAN SECTION     x3   COLONOSCOPY WITH PROPOFOL N/A 06/25/2015   Procedure: COLONOSCOPY WITH PROPOFOL;  Surgeon: Lucilla Lame, MD;  Location: [facility] ENDOSCOPY;  Service: Endoscopy;  Laterality: N/A;   COLONOSCOPY WITH PROPOFOL N/A 07/26/2020   Procedure: COLONOSCOPY WITH PROPOFOL;  Surgeon: Lucilla Lame, MD;  Location: [place];  Service: Endoscopy;  Laterality: N/A;   CYST REMOVAL LEG     and on shoulder    ESOPHAGOGASTRODUODENOSCOPY (EGD) WITH PROPOFOL N/A 07/26/2020   Procedure: ESOPHAGOGASTRODUODENOSCOPY (EGD) WITH PROPOFOL;  Surgeon: Lucilla Lame, MD;  Location: [place];  Service: Endoscopy;  Laterality: N/A;  Diabetic - oral meds   LOWER EXTREMITY  ANGIOGRAPHY Left 09/29/2018   Procedure: LOWER EXTREMITY ANGIOGRAPHY;  Surgeon: Algernon Huxley, MD;  Location: [place] [facility];  Service: Cardiovascular;  Laterality: Left;   LUNG BIOPSY  05 15 2013   has lung "spots"   PERIPHERAL VASCULAR CATHETERIZATION Left 06/01/2016   Procedure: Lower Extremity Angiography;  Surgeon:  Algernon Huxley, MD;  Location: [place] [facility];  Service: Cardiovascular;  Laterality: Left;   PERIPHERAL VASCULAR CATHETERIZATION N/A 06/01/2016   Procedure: Abdominal Aortogram w/Lower Extremity;  Surgeon: Algernon Huxley, MD;  Location: [place] [facility];  Service: Cardiovascular;  Laterality: N/A;   PERIPHERAL VASCULAR CATHETERIZATION  06/01/2016   Procedure: Lower Extremity Intervention;  Surgeon: Algernon Huxley, MD;  Location: [place] [facility];  Service: Cardiovascular;;   PERIPHERAL VASCULAR CATHETERIZATION Right 06/08/2016   Procedure: Lower Extremity Angiography;  Surgeon: Algernon Huxley, MD;  Location: [place] [facility];  Service: Cardiovascular;  Laterality: Right;   PERIPHERAL VASCULAR CATHETERIZATION  06/08/2016   Procedure: Lower Extremity Intervention;  Surgeon: Algernon Huxley, MD;  Location: [place] [facility];  Service: Cardiovascular;;   SUBMANDIBULAR GLAND EXCISION Left 12/06/2020   Procedure: EXCISION SUBMANDIBULAR GLAND;  Surgeon: Beverly Gust, MD;  Location: [place];  Service: ENT;  Laterality: Left;  needs to be first case Diabetic - diet controlled   TEMPORARY PACEMAKER N/A 07/11/2021   Procedure: TEMPORARY PACEMAKER;  Surgeon: Isaias Cowman, MD;  Location: [place] [facility];  Service: Cardiovascular;  Laterality: N/A;     Social History   reports that she quit smoking about 11 years ago. Her smoking use included cigarettes. She has a 37.00 pack-year smoking history. She has quit using smokeless tobacco.  Her smokeless tobacco use included snuff. She reports that she does not currently use alcohol after a past usage of about  5.0 standard drinks per week. She reports that she does not currently use drugs after having used the following drugs: Marijuana, "Crack" cocaine, and Cocaine.   Family History   Her family history includes Coronary artery disease in her brother; Diabetes in her maternal grandmother, mother, paternal grandmother, sister, and sister; Heart attack in her brother and brother; Hypercholesterolemia in her mother; Hypertension in her sister; Lung cancer in her father; Vascular Disease in her brother.   Allergies Allergies  Allergen Reactions   Trelegy Ellipta [Fluticasone-Umeclidin-Vilant]      Home Medications  Prior to Admission medications   Medication Sig Start Date End Date Taking? Authorizing Provider  predniSONE (DELTASONE) 20 MG tablet 3 tablets daily x 4 days 07/11/21  Yes Paulette Blanch, MD  Accu-Chek Softclix Lancets lancets Use as instructed for twice a daily Dx E11.65 04/11/21   [name], MD  albuterol (PROVENTIL) (2.5 MG/3ML) 0.083% nebulizer solution USE 1 VIAL IN NEBULIZER EVERY 6 HOURS AS NEEDED FOR WHEEZING 06/30/21   [name], MD  albuterol (VENTOLIN HFA) 108 (90 Base) MCG/ACT inhaler INHALE 2 PUFFS BY MOUTH EVERY 6 HOURS AS NEEDED FOR WHEEZING FOR SHORTNESS OF BREATH 06/26/21   [name], MD  ALPRAZolam Duanne Moron) 0.25 MG tablet Take 1 tablet (0.25 mg total) by mouth 3 (three) times daily as needed for anxiety or sleep. 07/11/21   Paulette Blanch, MD  Blood Glucose Monitoring Suppl (ACCU-CHEK GUIDE) w/Device KIT Use as directed Dx e11.65 04/11/21   [name], MD  clopidogrel (PLAVIX) 75 MG tablet Take 1 tablet by mouth once daily 06/22/21   Jonetta Osgood, NP  escitalopram (LEXAPRO) 10 MG tablet Take 1 tablet (10 mg total) by mouth daily. 05/08/21   Ezekiel Slocumb, DO  famotidine (PEPCID) 20 MG tablet Take 1 tablet (20 mg total) by mouth daily for 14 days. 06/14/21 06/28/21  Nolberto Hanlon, MD  ferrous sulfate 325 (65 FE) MG tablet Take 325 mg by  mouth 2 (two) times  daily with a meal.    [provider]  fluticasone (FLONASE) 50 MCG/ACT nasal spray Place 2 sprays into both nostrils daily. 05/09/21   Ezekiel Slocumb, DO  gabapentin (NEURONTIN) 100 MG capsule Take 1 capsule (100 mg total) by mouth 2 (two) times daily. 04/08/21   Jonetta Osgood, NP  glucose blood (ACCU-CHEK GUIDE) test strip USE  STRIP TO CHECK GLUCOSE TWICE DAILY 06/08/21   [name], MD  insulin detemir (LEVEMIR FLEXTOUCH) 100 UNIT/ML FlexPen Inject 8- 12 units with supper qd// with needles 04/22/21   [name], MD  lisinopril (ZESTRIL) 10 MG tablet Take 1 tablet (10 mg total) by mouth daily. 05/09/21   Ezekiel Slocumb, DO  metFORMIN (GLUCOPHAGE) 500 MG tablet Take 1 tablet (500 mg total) by mouth daily with breakfast. 03/12/21   [name], MD  mometasone-formoterol [facility]) 200-5 MCG/ACT AERO Inhale 2 puffs into the lungs 2 (two) times daily. 06/17/21   Jonetta Osgood, NP  Omega-3 Fatty Acids (FISH OIL) 1000 MG CAPS Take 1 capsule by mouth daily.    [provider]  OXYGEN Inhale into the lungs.    [provider]  potassium chloride (KLOR-CON) 10 MEQ tablet TAKE 1 TABLET BY MOUTH EVERY OTHER DAY 04/29/21   [name], MD  tiotropium (SPIRIVA HANDIHALER) 18 MCG inhalation capsule INHALE THE CONTENTS OF ONE CAPSULE BY MOUTH ONCE DAILY. DO NOT SWALLOW CAPSULE. 06/17/21   Jonetta Osgood, NP  Scheduled Meds:  albuterol  2.5 mg Nebulization QID   budesonide (PULMICORT) nebulizer solution  0.25 mg Nebulization BID   chlorhexidine  15 mL Mouth Rinse BID   Chlorhexidine Gluconate Cloth  6 each Topical Daily   enoxaparin (LOVENOX) injection  40 mg Subcutaneous Q24H   famotidine  20 mg Oral Daily   furosemide  40 mg Intravenous Daily   insulin aspart  0-20 Units Subcutaneous Q4H   insulin detemir  12 Units Subcutaneous Q2200   mouth rinse  15 mL Mouth Rinse q12n4p   methylPREDNISolone (SOLU-MEDROL) injection  40 mg Intravenous Daily   metoprolol  tartrate  12.5 mg Oral BID   Continuous Infusions:  ampicillin-sulbactam (UNASYN) IV 3 g (07/13/21 0528)   calcium gluconate     DOPamine Stopped (07/11/21 1424)   potassium chloride 10 mEq (07/13/21 1020)   PRN Meds:.acetaminophen, ALPRAZolam, docusate sodium, ipratropium-albuterol, phenol, polyethylene glycol  Assessment & Plan:  Acute Hypoxic Respiratory Failure secondary to COPD exacerbation Severe metabolic acidosis PMHx: COPD on Chronic home 02 at 2L, Asthma, Lung Cancer, OSA      -s/p extubation        - now on room air       - repeat CXR this am       - TRH transfer to PCU   Symptomatic Bradycardia Presenting with HR in the low 30's, hypotension and syncopal episode S/p temporary pacemaker -Continue dopamine 5-20 mcg/kg/min wean as tolerated -Check TSH, UDS -Monitor BMP+Mag -Hold AV nodal drugs -Cardiology consult, input appreciated, plan for permanent place maker next week>?  Chronic systolic CHF (last known EF 45 to 50% on 04/03/21) -Hypertension now Hypotensive in the setting Symptomatic bradycardia Hx: HLD  -Continuous cardiac monitoring -Maintain MAP greater than 65 -Avoid AV nodal blocking drugs consider amlodipine ARB or ACE inhibitor  -Cardiology following, appreciate input -Repeat 2D Echocardiogram                  -Lasix 40 daily IV  Diabetes mellitus now with steroids induced hyperglycemia -CBGs -Resistant Sliding scale insulin -Follow ICU hyper/hypoglycemia protocol -Hold home Metformin      Best practice:  Diet:  Tube Feed  Pain/Anxiety/Delirium protocol (if indicated): Yes (RASS goal 0) VAP protocol (if indicated): Yes DVT prophylaxis: LMWH GI prophylaxis: H2B Glucose control:  SSI Yes Central venous access:  N/A Arterial line:  N/A Foley:  Yes, and it is still needed Mobility:  bed rest  PT consulted: N/A Last date of multidisciplinary goals of care discussion [11/25] Code Status:  full code Disposition: ICU   = Goals of Care  = Code Status Order: ICU  Primary Emergency ContactChanetta Marshall, Home Phone: [phone] Wishes to pursue full aggressive treatment and intervention options, including CPR and intubation, but goals of care will be addressed on going with family if that should become necessary.   Ottie Glazier, M.D.  Pulmonary & [place]   .

## 2021-07-13 NOTE — Progress Notes (Signed)
PHARMACY CONSULT NOTE - FOLLOW UP  Pharmacy Consult for Electrolyte Monitoring and Replacement   Recent Labs: Potassium (mmol/L)  Date Value  07/13/2021 3.3 (L)  04/17/2014 3.9   Magnesium (mg/dL)  Date Value  07/13/2021 2.4   Calcium (mg/dL)  Date Value  07/13/2021 8.2 (L)   Calcium, Total (mg/dL)  Date Value  04/17/2014 9.1   Albumin (g/dL)  Date Value  07/11/2021 3.3 (L)  03/28/2020 4.7  04/17/2014 4.1   Phosphorus (mg/dL)  Date Value  07/13/2021 4.1   Sodium (mmol/L)  Date Value  07/13/2021 140  03/28/2020 137  04/17/2014 141     Assessment: 65yo female recently admitted 11/22-11/24 with symptomatic bradycardia and presyncope that improved/stabilized and was prepared to go home with close f/u. However, upon discharge had a syncopal episode in the parking lot and req'd readmission. 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 Pharmacy consulted for electrolytes mgmt. Now extubated.    Goal of Therapy:  Electrolytes WNL   Plan:  Medical team ordered KCL 10 mEq x 4 IV + 40 mEq PO x 1.  Recheck electrolytes with AM labs.   Eleonore Chiquito, PharmD, Clinical Pharmacist 07/13/2021 9:11 AM

## 2021-07-14 ENCOUNTER — Encounter
Admission: EM | Disposition: A | Discharge status: Home / Self Care | Payer: Self-pay | Source: Home / Self Care | Attending: Internal Medicine

## 2021-07-14 ENCOUNTER — Encounter: Payer: Self-pay | Admitting: Cardiology

## 2021-07-14 DIAGNOSIS — R001 Bradycardia, unspecified: Secondary | ICD-10-CM | POA: Diagnosis not present

## 2021-07-14 HISTORY — PX: PACEMAKER IMPLANT: EP1218

## 2021-07-14 HISTORY — PX: PACEMAKER LEADLESS INSERTION: EP1219

## 2021-07-14 LAB — GLUCOSE, CAPILLARY
Glucose-Capillary: 127 mg/dL — ABNORMAL HIGH (ref 70–99)
Glucose-Capillary: 136 mg/dL — ABNORMAL HIGH (ref 70–99)
Glucose-Capillary: 146 mg/dL — ABNORMAL HIGH (ref 70–99)
Glucose-Capillary: 173 mg/dL — ABNORMAL HIGH (ref 70–99)
Glucose-Capillary: 220 mg/dL — ABNORMAL HIGH (ref 70–99)
Glucose-Capillary: 224 mg/dL — ABNORMAL HIGH (ref 70–99)
Glucose-Capillary: 254 mg/dL — ABNORMAL HIGH (ref 70–99)
Glucose-Capillary: 53 mg/dL — ABNORMAL LOW (ref 70–99)

## 2021-07-14 LAB — BASIC METABOLIC PANEL
Anion gap: 6 (ref 5–15)
BUN: 19 mg/dL (ref 8–23)
CO2: 32 mmol/L (ref 22–32)
Calcium: 8.3 mg/dL — ABNORMAL LOW (ref 8.9–10.3)
Chloride: 101 mmol/L (ref 98–111)
Creatinine, Ser: 0.54 mg/dL (ref 0.44–1.00)
GFR, Estimated: >60 mL/min (ref >60)
Glucose, Bld: 223 mg/dL — ABNORMAL HIGH (ref 70–99)
Potassium: 3.3 mmol/L — ABNORMAL LOW (ref 3.5–5.1)
Sodium: 139 mmol/L (ref 135–145)

## 2021-07-14 LAB — CBC
HCT: 31 % — ABNORMAL LOW (ref 36.0–46.0)
Hemoglobin: 9.6 g/dL — ABNORMAL LOW (ref 12.0–15.0)
MCH: 29 pg (ref 26.0–34.0)
MCHC: 31 g/dL (ref 30.0–36.0)
MCV: 93.7 fL (ref 80.0–100.0)
Platelets: 181 10*3/uL (ref 150–400)
RBC: 3.31 MIL/uL — ABNORMAL LOW (ref 3.87–5.11)
RDW: 13.7 % (ref 11.5–15.5)
WBC: 6.8 10*3/uL (ref 4.0–10.5)
nRBC: 0 % (ref 0.0–0.2)

## 2021-07-14 LAB — POTASSIUM: Potassium: 3.3 mmol/L — ABNORMAL LOW (ref 3.5–5.1)

## 2021-07-14 LAB — CULTURE, RESPIRATORY W GRAM STAIN

## 2021-07-14 LAB — PHOSPHORUS: Phosphorus: 4.6 mg/dL (ref 2.5–4.6)

## 2021-07-14 LAB — MAGNESIUM: Magnesium: 2.3 mg/dL (ref 1.7–2.4)

## 2021-07-14 SURGERY — PACEMAKER LEADLESS INSERTION
Anesthesia: Moderate Sedation

## 2021-07-14 MED ORDER — HEPARIN (PORCINE) IN NACL 1000-0.9 UT/500ML-% IV SOLN
INTRAVENOUS | Status: DC | PRN
  Administered 2021-07-14: 1000 mL

## 2021-07-14 MED ORDER — CEFAZOLIN SODIUM-DEXTROSE 2-4 GM/100ML-% IV SOLN
2.0000 g | INTRAVENOUS | Status: DC
  Filled 2021-07-14: qty 100

## 2021-07-14 MED ORDER — HEPARIN (PORCINE) IN NACL 1000-0.9 UT/500ML-% IV SOLN
INTRAVENOUS | Status: AC
  Filled 2021-07-14: qty 1000

## 2021-07-14 MED ORDER — FENTANYL CITRATE (PF) 100 MCG/2ML IJ SOLN
INTRAMUSCULAR | Status: AC
  Filled 2021-07-14: qty 2

## 2021-07-14 MED ORDER — MIDAZOLAM HCL 2 MG/2ML IJ SOLN
INTRAMUSCULAR | Status: DC | PRN
  Administered 2021-07-14: .5 mg via INTRAVENOUS

## 2021-07-14 MED ORDER — ADULT MULTIVITAMIN W/MINERALS CH
1.0000 | ORAL_TABLET | Freq: Every day | ORAL | Status: DC
  Administered 2021-07-14 – 2021-07-15 (×2): 1 via ORAL
  Filled 2021-07-14 (×2): qty 1

## 2021-07-14 MED ORDER — SODIUM CHLORIDE 0.9% FLUSH: 3.0000 mL | INTRAVENOUS | Status: DC | PRN

## 2021-07-14 MED ORDER — SODIUM CHLORIDE 0.9 % IV SOLN: 250.0000 mL | INTRAVENOUS | Status: DC | PRN

## 2021-07-14 MED ORDER — SODIUM CHLORIDE 0.45 % IV SOLN: INTRAVENOUS | Status: DC

## 2021-07-14 MED ORDER — IOHEXOL 350 MG/ML SOLN
INTRAVENOUS | Status: DC | PRN
  Administered 2021-07-14: 09:00:00 10 mL

## 2021-07-14 MED ORDER — LIDOCAINE HCL (PF) 1 % IJ SOLN
INTRAMUSCULAR | Status: DC | PRN
  Administered 2021-07-14: 20 mL via SUBCUTANEOUS

## 2021-07-14 MED ORDER — MIDAZOLAM HCL 2 MG/2ML IJ SOLN
INTRAMUSCULAR | Status: AC
  Filled 2021-07-14: qty 2

## 2021-07-14 MED ORDER — ONDANSETRON HCL 4 MG/2ML IJ SOLN: 4.0000 mg | Freq: Four times a day (QID) | INTRAMUSCULAR | Status: DC | PRN

## 2021-07-14 MED ORDER — HEPARIN SODIUM (PORCINE) 1000 UNIT/ML IJ SOLN
INTRAMUSCULAR | Status: AC
  Filled 2021-07-14: qty 10

## 2021-07-14 MED ORDER — SODIUM CHLORIDE 0.9 % IV SOLN: INTRAVENOUS | Status: DC

## 2021-07-14 MED ORDER — SODIUM CHLORIDE 0.9% FLUSH
3.0000 mL | Freq: Two times a day (BID) | INTRAVENOUS | Status: DC
  Administered 2021-07-14 – 2021-07-15 (×3): 3 mL via INTRAVENOUS

## 2021-07-14 MED ORDER — INSULIN ASPART 100 UNIT/ML IJ SOLN
0.0000 [IU] | Freq: Three times a day (TID) | INTRAMUSCULAR | Status: DC
  Administered 2021-07-14: 17:00:00 2 [IU] via SUBCUTANEOUS
  Administered 2021-07-15: 1 [IU] via SUBCUTANEOUS
  Administered 2021-07-15: 3 [IU] via SUBCUTANEOUS
  Filled 2021-07-14 (×3): qty 1

## 2021-07-14 MED ORDER — ACETAMINOPHEN 325 MG PO TABS
650.0000 mg | ORAL_TABLET | ORAL | Status: DC | PRN
  Administered 2021-07-14 (×2): 650 mg via ORAL
  Filled 2021-07-14 (×2): qty 2

## 2021-07-14 MED ORDER — HEPARIN SODIUM (PORCINE) 1000 UNIT/ML IJ SOLN
INTRAMUSCULAR | Status: DC | PRN
  Administered 2021-07-14: 3000 [IU] via INTRAVENOUS

## 2021-07-14 MED ORDER — LIDOCAINE HCL 1 % IJ SOLN
INTRAMUSCULAR | Status: AC
  Filled 2021-07-14: qty 20

## 2021-07-14 MED ORDER — DEXTROSE 50 % IV SOLN: 25.0000 g | INTRAVENOUS | Status: AC

## 2021-07-14 MED ORDER — FENTANYL CITRATE (PF) 100 MCG/2ML IJ SOLN
INTRAMUSCULAR | Status: DC | PRN
  Administered 2021-07-14 (×2): 25 ug via INTRAVENOUS

## 2021-07-14 MED ORDER — DEXTROSE 50 % IV SOLN
INTRAVENOUS | Status: AC
  Administered 2021-07-14: 04:00:00 25 g via INTRAVENOUS
  Filled 2021-07-14: qty 50

## 2021-07-14 MED ORDER — INSULIN DETEMIR 100 UNIT/ML ~~LOC~~ SOLN
5.0000 [IU] | Freq: Every day | SUBCUTANEOUS | Status: DC
  Administered 2021-07-14: 20:00:00 5 [IU] via SUBCUTANEOUS
  Filled 2021-07-14 (×2): qty 0.05

## 2021-07-14 MED ORDER — GLUCERNA SHAKE PO LIQD
237.0000 mL | Freq: Three times a day (TID) | ORAL | Status: DC
Start: 2021-07-14 — End: 2021-07-15
  Administered 2021-07-14 (×2): 237 mL via ORAL

## 2021-07-14 SURGICAL SUPPLY — 15 items
DILATOR VESSEL 38 20CM 12FR (INTRODUCER) ×1 IMPLANT
DILATOR VESSEL 38 20CM 14FR (INTRODUCER) ×1 IMPLANT
DILATOR VESSEL 38 20CM 18FR (INTRODUCER) ×1 IMPLANT
DILATOR VESSEL 38 20CM 8FR (INTRODUCER) ×1 IMPLANT
MICRA AV TRANSCATH PACING SYS (Pacemaker) ×2 IMPLANT
MICRA INTRODUCER SHEATH (SHEATH) ×2
NDL PERC 18GX7CM (NEEDLE) IMPLANT
NEEDLE PERC 18GX7CM (NEEDLE) ×2 IMPLANT
PACK CARDIAC CATH (CUSTOM PROCEDURE TRAY) ×2 IMPLANT
PAD ELECT DEFIB RADIOL ZOLL (MISCELLANEOUS) ×1 IMPLANT
SHEATH AVANTI 7FRX11 (SHEATH) ×1 IMPLANT
SHEATH INTRODUCER MICRA (SHEATH) IMPLANT
SYSTEM PACING TRNSCTH AV MICRA (Pacemaker) IMPLANT
WIRE AMPLATZ SS-J .035X180CM (WIRE) ×1 IMPLANT
WIRE GUIDERIGHT .035X150 (WIRE) ×1 IMPLANT

## 2021-07-14 NOTE — Progress Notes (Signed)
Pts sight is still oozing blood, md notified, will continue to monitor

## 2021-07-14 NOTE — Progress Notes (Signed)
Nutrition Follow-up  DOCUMENTATION CODES:   Not applicable  INTERVENTION:   -MVI with minerals daily -Glucerna Shake po TID, each supplement provides 220 kcal and 10 grams of protein   NUTRITION DIAGNOSIS:   Inadequate oral intake related to inability to eat (pt sedated and ventilated) as evidenced by NPO status.  Progressing; advanced to PO diet on 07/12/21  GOAL:   Patient will meet greater than or equal to 90% of their needs  Progressing   MONITOR:   PO intake, Supplement acceptance, Labs, Weight trends, Skin, I & O's  REASON FOR ASSESSMENT:   Ventilator    ASSESSMENT:   65 y/o female with h/o anxiety, depression, substance abuse, lung cancer, HTN, HLD, CHF, OSA, DM and COPD who is admitted with syncope and found to have complete heart block now s/p temporary pacemaker 11/25.  11/25- extubated 11/26- advanced to PO diet  Reviewed I/O's: -2.5 L x 24 hours and -8.9 L since admission  UOP: 3.6 L x 24 hours  Pt out of room at time of visit, currently in cath lab. Pt for pacemaker placement today.   Pt currently on a heart healthy diet, however, no meal completions currently recorded.    Wt has been stable since admission.   Medications reviewed and include solu-medrol.   Labs reviewed: K: 3.3, CBGS: 53-224 (inpatient orders for glycemic control are 0-20 units insulin aspart every 4 hours).    Diet Order:   Diet Order             Diet Heart Room service appropriate? Yes; Fluid consistency: Thin  Diet effective now                   EDUCATION NEEDS:   No education needs have been identified at this time  Skin:  Skin Assessment: Reviewed RN Assessment  Last BM:  07/13/21  Height:   Ht Readings from Last 1 Encounters:  07/11/21 4' 11.02" (1.499 m)    Weight:   Wt Readings from Last 1 Encounters:  07/14/21 58.6 kg    Ideal Body Weight:  44.5 kg  BMI:  Body mass index is 26.08 kg/m.  Estimated Nutritional Needs:   Kcal:   6967-8938  Protein:  85-100 grams  Fluid:  > 1.7 L    Loistine Chance, RD, LDN, [employer] Registered Dietitian II Certified Diabetes Care and Education Specialist Please refer to [facility] for RD and/or RD on-call/weekend/after hours pager

## 2021-07-14 NOTE — Progress Notes (Signed)
Patient began oozing blood from R FEM sight, asymptomatic, held pressure for 5 min as well as changed dressing, at next sign check pt was oozing again from sight, asymptomatic. Pt educated and sight marked, will continue to monitor.

## 2021-07-14 NOTE — Progress Notes (Addendum)
Progress Note    Angela Jensen  RCV:[id number] DOB: 1956/03/29  DOA: 07/10/2021 PCP: [name], MD      Brief Narrative:    Medical records reviewed and are as summarized below:  Angela Jensen is a 65 y.o. female medical history significant for COPD with chronic respiratory failure on 2.5 L of oxygen, chronic systolic CHF, hypertension, history of Lung CA (  RUL adenocarcinoma s/p radiation 2013 and 2021), depressison, DM, PVD.  She was recently admitted on 07/08/2021 for symptomatic bradycardia and presyncope and she remained hemodynamically stable throughout her admission.  She was discharged on 07/10/2021 with instructions from her cardiologist to follow-up in the office for further management..  She presented to the hospital with dizziness, lightheadedness and syncope. She was found to have complete heart block, COPD exacerbation and acute on chronic hypoxemic respiratory failure.  She was intubated for airway protection and acute respiratory failure.  Temporary transvenous pacemaker was placed 08/05/2021.  She was treated with IV dopamine infusion, steroids and bronchodilators.  She was also given IV Lasix.  She was transferred to the hospitalist service on 07/13/2021.  She underwent permanent pacemaker placement on 07/14/2021.    Assessment/Plan:   Principal Problem:   Symptomatic bradycardia   Nutrition Problem: Inadequate oral intake Etiology: inability to eat (pt sedated and ventilated)  Signs/Symptoms: NPO status   Body mass index is 26.08 kg/m.   Complete heart block, intermittent bradycardia, s/p syncope: S/p leadless pacemaker placement on 07/14/2021.  S/p transvenous temporary pacemaker on 07/11/2021.  She had some bleeding at the right femoral procedure site and manual pressure has been applied.  Follow-up with cardiologist for further recommendations.  COPD exacerbation: Continue antibiotics,steroids and bronchodilators. Pseudomonas aeruginosa isolated  from tracheal aspirate.  However, I suspect this is a colonizer  Acute on chronic hypoxemic respiratory failure: Continue 2 L/min oxygen via nasal cannula.  She uses 2.5 to 3 L/min oxygen at home. Intubated on 07/11/2021 and extubated on 07/12/2021.  Chronic systolic CHF, hypertension: Compensated.  EF 45 to 50%  Insulin-dependent diabetes mellitus, episodes of hypoglycemia: Restart Levemir at 5 units nightly.  Hypokalemia: Replete potassium and monitor  Anxiety: Xanax as needed for anxiety.  Right lung nodules, history of lung cancer: Follow-up with pulmonologist    Diet Order             Diet Heart Room service appropriate? Yes; Fluid consistency: Thin  Diet effective now                      Consultants: Cardiologist Intensivist  Procedures: None    Medications:    albuterol  2.5 mg Nebulization QID   budesonide (PULMICORT) nebulizer solution  0.25 mg Nebulization BID   chlorhexidine  15 mL Mouth Rinse BID   Chlorhexidine Gluconate Cloth  6 each Topical Daily   enoxaparin (LOVENOX) injection  40 mg Subcutaneous Q24H   famotidine  20 mg Oral Daily   feeding supplement (GLUCERNA SHAKE)  237 mL Oral TID BM   insulin aspart  0-20 Units Subcutaneous Q4H   mouth rinse  15 mL Mouth Rinse q12n4p   methylPREDNISolone (SOLU-MEDROL) injection  40 mg Intravenous Daily   metoprolol tartrate  12.5 mg Oral BID   multivitamin with minerals  1 tablet Oral Daily   sodium chloride flush  3 mL Intravenous Q12H   Continuous Infusions:  sodium chloride     ampicillin-sulbactam (UNASYN) IV 200 mL/hr at 07/14/21 1134  calcium gluconate     DOPamine Stopped (07/11/21 1424)     Anti-infectives (From admission, onward)    Start     Dose/Rate Route Frequency Ordered Stop   07/14/21 0500  ceFAZolin (ANCEF) IVPB 2g/100 mL premix  Status:  Discontinued        2 g 200 mL/hr over 30 Minutes Intravenous On call 07/14/21 0401 07/14/21 1055   07/11/21 1315  ceFAZolin (ANCEF)  IVPB 2g/100 mL premix        2 g 200 mL/hr over 30 Minutes Intravenous On call 07/11/21 1228 07/12/21 1315   07/11/21 1230  Ampicillin-Sulbactam (UNASYN) 3 g in sodium chloride 0.9 % 100 mL IVPB        3 g 200 mL/hr over 30 Minutes Intravenous Every 6 hours 07/11/21 1141                Family Communication/Anticipated D/C date and plan/Code Status   DVT prophylaxis: enoxaparin (LOVENOX) injection 40 mg Start: 07/11/21 0800 SCDs Start: 07/11/21 0413     Code Status: Full Code  Family Communication: None Disposition Plan: Plan to discharge home when cleared by cardiologist   Status is: Inpatient  Remains inpatient appropriate because: Plan for pacemaker placement tomorrow           Subjective:   Interval events noted.  She had pacemaker placed this morning.  No dizziness, chest pain, shortness of breath or palpitations.  Her nurse was at the bedside applying pressure to the right femoral procedure site because of bleeding at the site.  Objective:    Vitals:   07/14/21 0915 07/14/21 0930 07/14/21 0945 07/14/21 1100  BP: (!) 125/94 123/88 (!) 140/95 129/90  Pulse: 93 93 97 93  Resp: 17 18 17 16   Temp:      TempSrc:      SpO2: 100% 100% 100% 99%  Weight:      Height:       No data found.   Intake/Output Summary (Last 24 hours) at 07/14/2021 1313 Last data filed at 07/14/2021 1134 Gross per 24 hour  Intake 700.67 ml  Output 1300 ml  Net -599.33 ml   Filed Weights   07/10/21 2010 07/13/21 0446 07/14/21 0500  Weight: 58.2 kg 58.3 kg 58.6 kg    Exam:  GEN: NAD SKIN: Dressing on right femoral procedural site was soaked with blood EYES: EOMI ENT: MMM CV: RRR PULM: CTA B ABD: soft, ND, NT, +BS CNS: AAO x 3, non focal EXT: No edema or tenderness         Data Reviewed:   I have personally reviewed following labs and imaging studies:  Labs: Labs show the following:   Basic Metabolic Panel: Recent Labs  Lab 07/10/21 2043  07/11/21 0644 07/11/21 0645 07/12/21 0313 07/13/21 0506 07/14/21 0513  NA 138 139  --  142 140 139  K 4.3 4.6  --  3.7 3.3* 3.3*  CL 103 104  --  107 101 101  CO2 30 27  --  31 35* 32  GLUCOSE 319* 401*  --  98 81 223*  BUN 21 23  --  16 16 19   CREATININE 0.88 0.97  --  0.42* 0.45 0.54  CALCIUM 8.2* 8.0*  --  8.0* 8.2* 8.3*  MG  --   --  2.3 2.3 2.4 2.3  PHOS  --   --  2.5 3.5 4.1 4.6   GFR Estimated Creatinine Clearance: 54.7 mL/min (by C-G formula based on SCr of  0.54 mg/dL). Liver Function Tests: Recent Labs  Lab 07/08/21 2132 07/10/21 2043 07/11/21 0644  AST 150* 57* 58*  ALT 134* 81* 81*  ALKPHOS 80 71 70  BILITOT 0.6 0.4 0.6  PROT 6.8 6.5 6.4*  ALBUMIN 3.7 3.4* 3.3*   No results for input(s): LIPASE, AMYLASE in the last 168 hours. No results for input(s): AMMONIA in the last 168 hours. Coagulation profile No results for input(s): INR, PROTIME in the last 168 hours.  CBC: Recent Labs  Lab 07/08/21 2132 07/09/21 0722 07/10/21 2043 07/11/21 0644 07/12/21 0313 07/13/21 0506 07/14/21 0513  WBC 5.1   < > 8.4 15.1* 11.2* 7.6 6.8  NEUTROABS 3.5  --  7.2 13.9*  --   --   --   HGB 10.5*   < > 10.1* 10.4* 8.4* 9.2* 9.6*  HCT 33.6*   < > 33.4* 34.6* 27.8* 29.9* 31.0*  MCV 94.9   < > 97.1 96.9 94.9 94.3 93.7  PLT 236   < > 207 238 176 185 181   < > = values in this interval not displayed.   Cardiac Enzymes: No results for input(s): CKTOTAL, CKMB, CKMBINDEX, TROPONINI in the last 168 hours. BNP (last 3 results) No results for input(s): PROBNP in the last 8760 hours. CBG: Recent Labs  Lab 07/14/21 0000 07/14/21 0416 07/14/21 0509 07/14/21 0716 07/14/21 1117  GLUCAP 220* 53* 224* 136* 127*   D-Dimer: No results for input(s): DDIMER in the last 72 hours.  Hgb A1c: No results for input(s): HGBA1C in the last 72 hours. Lipid Profile: No results for input(s): CHOL, HDL, LDLCALC, TRIG, CHOLHDL, LDLDIRECT in the last 72 hours. Thyroid function studies: No  results for input(s): TSH, T4TOTAL, T3FREE, THYROIDAB in the last 72 hours.  Invalid input(s): FREET3 Anemia work up: No results for input(s): VITAMINB12, FOLATE, FERRITIN, TIBC, IRON, RETICCTPCT in the last 72 hours. Sepsis Labs: Recent Labs  Lab 07/11/21 0644 07/11/21 0645 07/11/21 0907 07/12/21 0313 07/13/21 0506 07/14/21 0513  PROCALCITON  --  0.12  --  0.53 0.24  --   WBC 15.1*  --   --  11.2* 7.6 6.8  LATICACIDVEN 5.9*  --  4.9*  --   --   --     Microbiology Recent Results (from the past 240 hour(s))  Resp Panel by RT-PCR (Flu A&B, Covid) Nasopharyngeal Swab     Status: None   Collection Time: 07/09/21  1:19 AM   Specimen: Nasopharyngeal Swab; Nasopharyngeal(NP) swabs in vial transport medium  Result Value Ref Range Status   SARS Coronavirus 2 by RT PCR NEGATIVE NEGATIVE Final    Comment: (NOTE) SARS-CoV-2 target nucleic acids are NOT DETECTED.  The SARS-CoV-2 RNA is generally detectable in upper respiratory specimens during the acute phase of infection. The lowest concentration of SARS-CoV-2 viral copies this assay can detect is 138 copies/mL. A negative result does not preclude SARS-Cov-2 infection and should not be used as the sole basis for treatment or other patient management decisions. A negative result may occur with  improper specimen collection/handling, submission of specimen other than nasopharyngeal swab, presence of viral mutation(s) within the areas targeted by this assay, and inadequate number of viral copies(<138 copies/mL). A negative result must be combined with clinical observations, patient history, and epidemiological information. The expected result is Negative.  Fact Sheet for Patients:  EntrepreneurPulse.com.au  Fact Sheet for Healthcare Providers:  IncredibleEmployment.be  This test is no t yet approved or cleared by the [place] and  has been authorized for detection and/or diagnosis of  SARS-CoV-2 by FDA under an Emergency Use Authorization (EUA). This EUA will remain  in effect (meaning this test can be used) for the duration of the COVID-19 declaration under Section 564(b)(1) of the Act, 21 U.S.C.section 360bbb-3(b)(1), unless the authorization is terminated  or revoked sooner.       Influenza A by PCR NEGATIVE NEGATIVE Final   Influenza B by PCR NEGATIVE NEGATIVE Final    Comment: (NOTE) The Xpert Xpress SARS-CoV-2/FLU/RSV plus assay is intended as an aid in the diagnosis of influenza from Nasopharyngeal swab specimens and should not be used as a sole basis for treatment. Nasal washings and aspirates are unacceptable for Xpert Xpress SARS-CoV-2/FLU/RSV testing.  Fact Sheet for Patients: EntrepreneurPulse.com.au  Fact Sheet for Healthcare Providers: IncredibleEmployment.be  This test is not yet approved or cleared by the [place] FDA and has been authorized for detection and/or diagnosis of SARS-CoV-2 by FDA under an Emergency Use Authorization (EUA). This EUA will remain in effect (meaning this test can be used) for the duration of the COVID-19 declaration under Section 564(b)(1) of the Act, 21 U.S.C. section 360bbb-3(b)(1), unless the authorization is terminated or revoked.  Performed at [facility], [address] [place], [place] [address]   MRSA Next Gen by PCR, Nasal     Status: None   Collection Time: 07/11/21  6:21 AM   Specimen: Nasal Mucosa; Nasal Swab  Result Value Ref Range Status   MRSA by PCR Next Gen NOT DETECTED NOT DETECTED Final    Comment: (NOTE) The GeneXpert MRSA Assay (FDA approved for NASAL specimens only), is one component of a comprehensive MRSA colonization surveillance program. It is not intended to diagnose MRSA infection nor to guide or monitor treatment for MRSA infections. Test performance is not FDA approved in patients less than 16 years old. Performed at [facility], [place] [place], [place] [address]   CULTURE, BLOOD (ROUTINE X 2) w Reflex to ID Panel     Status: None (Preliminary result)   Collection Time: 07/11/21  6:43 AM   Specimen: BLOOD  Result Value Ref Range Status   Specimen Description BLOOD RIGHT [facility]  Final   Special Requests   Final    BOTTLES DRAWN AEROBIC AND ANAEROBIC Blood Culture adequate volume   Culture   Final    NO GROWTH 3 DAYS Performed at [facility], [address] [place], [place] [address]    Report Status PENDING  Incomplete  CULTURE, BLOOD (ROUTINE X 2) w Reflex to ID Panel     Status: None (Preliminary result)   Collection Time: 07/11/21  6:52 AM   Specimen: BLOOD  Result Value Ref Range Status   Specimen Description BLOOD RIGHT ARM  Final   Special Requests   Final    BOTTLES DRAWN AEROBIC AND ANAEROBIC Blood Culture adequate volume   Culture   Final    NO GROWTH 3 DAYS Performed at [facility], [address], [place], [place] [address]    Report Status PENDING  Incomplete  Culture, Respiratory w Gram Stain     Status: None   Collection Time: 07/11/21 12:23 PM   Specimen: Tracheal Aspirate; Respiratory  Result Value Ref Range Status   Specimen Description   Final    TRACHEAL ASPIRATE Performed at [facility], [address], [place], [place] [address]    Special Requests   Final    NONE Performed at  [place], [place] [address]    Gram Stain   Final    FEW WBC PRESENT, PREDOMINANTLY PMN NO ORGANISMS SEEN Performed at [place] [address] [place], [place] [address]    Culture RARE PSEUDOMONAS AERUGINOSA  Final   Report Status 07/14/2021 FINAL  Final   Organism ID, Bacteria PSEUDOMONAS AERUGINOSA  Final      Susceptibility   Pseudomonas aeruginosa - MIC*    CEFTAZIDIME 4 SENSITIVE Sensitive     CIPROFLOXACIN <=0.25 SENSITIVE Sensitive     GENTAMICIN <=1 SENSITIVE Sensitive     IMIPENEM 1  SENSITIVE Sensitive     PIP/TAZO 8 SENSITIVE Sensitive     CEFEPIME 2 SENSITIVE Sensitive     * RARE PSEUDOMONAS AERUGINOSA  Respiratory (~20 pathogens) panel by PCR     Status: None   Collection Time: 07/11/21 12:23 PM   Specimen: Nasopharyngeal Swab; Respiratory  Result Value Ref Range Status   Adenovirus NOT DETECTED NOT DETECTED Final   Coronavirus 229E NOT DETECTED NOT DETECTED Final    Comment: (NOTE) The Coronavirus on the Respiratory Panel, DOES NOT test for the novel  Coronavirus (2019 nCoV)    Coronavirus HKU1 NOT DETECTED NOT DETECTED Final   Coronavirus NL63 NOT DETECTED NOT DETECTED Final   Coronavirus OC43 NOT DETECTED NOT DETECTED Final   Metapneumovirus NOT DETECTED NOT DETECTED Final   Rhinovirus / Enterovirus NOT DETECTED NOT DETECTED Final   Influenza A NOT DETECTED NOT DETECTED Final   Influenza B NOT DETECTED NOT DETECTED Final   Parainfluenza Virus 1 NOT DETECTED NOT DETECTED Final   Parainfluenza Virus 2 NOT DETECTED NOT DETECTED Final   Parainfluenza Virus 3 NOT DETECTED NOT DETECTED Final   Parainfluenza Virus 4 NOT DETECTED NOT DETECTED Final   Respiratory Syncytial Virus NOT DETECTED NOT DETECTED Final   Bordetella pertussis NOT DETECTED NOT DETECTED Final   Bordetella Parapertussis NOT DETECTED NOT DETECTED Final   Chlamydophila pneumoniae NOT DETECTED NOT DETECTED Final   Mycoplasma pneumoniae NOT DETECTED NOT DETECTED Final    Comment: Performed at [facility], [place], [place], [place] [address]  CULTURE, BLOOD (ROUTINE X 2) w Reflex to ID Panel     Status: None (Preliminary result)   Collection Time: 07/11/21  1:44 PM   Specimen: BLOOD  Result Value Ref Range Status   Specimen Description BLOOD RIGHT ANTECUBITAL  Final   Special Requests   Final    BOTTLES DRAWN AEROBIC AND ANAEROBIC Blood Culture adequate volume   Culture   Final    NO GROWTH 3 DAYS Performed at [facility], [address] [place], [place] [address]     Report Status PENDING  Incomplete  CULTURE, BLOOD (ROUTINE X 2) w Reflex to ID Panel     Status: None (Preliminary result)   Collection Time: 07/11/21  1:49 PM   Specimen: BLOOD  Result Value Ref Range Status   Specimen Description BLOOD BLOOD LEFT HAND  Final   Special Requests   Final    BOTTLES DRAWN AEROBIC AND ANAEROBIC Blood Culture adequate volume   Culture   Final    NO GROWTH 3 DAYS Performed at [facility], [address], [place],  [address]    Report Status PENDING  Incomplete    Procedures and diagnostic studies:  EP PPM/ICD IMPLANT  Result Date: 07/14/2021 Successful Micra AV leadless pacemaker implantation  LOS: 3 days   Pamela Maddy  [facility]   Pager on [email or web address]. If 7PM-7AM, please contact night-coverage at [email or web address]     07/14/2021, 1:13 PM

## 2021-07-14 NOTE — Progress Notes (Addendum)
1300 Gauze over pacemaker site, right groin,covered with blood. Dr. Lenox Ponds notified. Pressure held at site, right groin , for 10 minutes per verbal orders . Right groin site re-dressed with clean gauze and tegaderm. Patient instructed to lay flat another hour per order.

## 2021-07-15 ENCOUNTER — Other Ambulatory Visit: Payer: Self-pay | Admitting: Nurse Practitioner

## 2021-07-15 DIAGNOSIS — F419 Anxiety disorder, unspecified: Secondary | ICD-10-CM

## 2021-07-15 DIAGNOSIS — R001 Bradycardia, unspecified: Secondary | ICD-10-CM | POA: Diagnosis not present

## 2021-07-15 LAB — BASIC METABOLIC PANEL
Anion gap: 4 — ABNORMAL LOW (ref 5–15)
BUN: 21 mg/dL (ref 8–23)
CO2: 30 mmol/L (ref 22–32)
Calcium: 8.4 mg/dL — ABNORMAL LOW (ref 8.9–10.3)
Chloride: 101 mmol/L (ref 98–111)
Creatinine, Ser: 0.48 mg/dL (ref 0.44–1.00)
GFR, Estimated: >60 mL/min (ref >60)
Glucose, Bld: 244 mg/dL — ABNORMAL HIGH (ref 70–99)
Potassium: 3.4 mmol/L — ABNORMAL LOW (ref 3.5–5.1)
Sodium: 135 mmol/L (ref 135–145)

## 2021-07-15 LAB — GLUCOSE, CAPILLARY
Glucose-Capillary: 134 mg/dL — ABNORMAL HIGH (ref 70–99)
Glucose-Capillary: 135 mg/dL — ABNORMAL HIGH (ref 70–99)
Glucose-Capillary: 204 mg/dL — ABNORMAL HIGH (ref 70–99)

## 2021-07-15 LAB — PHOSPHORUS: Phosphorus: 4.5 mg/dL (ref 2.5–4.6)

## 2021-07-15 LAB — MAGNESIUM: Magnesium: 2.2 mg/dL (ref 1.7–2.4)

## 2021-07-15 MED ORDER — POTASSIUM CHLORIDE CRYS ER 20 MEQ PO TBCR
40.0000 meq | EXTENDED_RELEASE_TABLET | Freq: Once | ORAL | Status: AC
  Administered 2021-07-15: 40 meq via ORAL

## 2021-07-15 MED ORDER — MORPHINE SULFATE (PF) 2 MG/ML IV SOLN: 2.0000 mg | INTRAVENOUS | Status: DC | PRN

## 2021-07-15 MED ORDER — OXYCODONE HCL 5 MG PO TABS
5.0000 mg | ORAL_TABLET | Freq: Four times a day (QID) | ORAL | Status: DC | PRN
  Administered 2021-07-15: 5 mg via ORAL

## 2021-07-15 MED ORDER — POTASSIUM CHLORIDE CRYS ER 20 MEQ PO TBCR
EXTENDED_RELEASE_TABLET | ORAL | Status: AC
  Filled 2021-07-15: qty 2

## 2021-07-15 MED ORDER — OXYCODONE HCL 5 MG PO TABS
ORAL_TABLET | ORAL | Status: AC
  Filled 2021-07-15: qty 1

## 2021-07-15 MED ORDER — METOPROLOL TARTRATE 25 MG PO TABS
12.5000 mg | ORAL_TABLET | Freq: Two times a day (BID) | ORAL | 0 refills | Status: DC
Start: 2021-07-15 — End: 2021-08-08

## 2021-07-15 NOTE — Plan of Care (Signed)
Patient has a pacemaker, able to void and ambulating independently on 2.5 L [place] home oxygen.

## 2021-07-15 NOTE — Discharge Summary (Signed)
Physician Discharge Summary  Angela Jensen SVX:[id number] DOB: 16-Mar-1956 DOA: 07/10/2021  PCP: [name], MD  Admit date: 07/10/2021 Discharge date: 07/15/2021  Discharge disposition: Home   Recommendations for Outpatient Follow-Up:   Follow-up with pulmonologist as soon as possible follow-up with Dr. Follow up with Dr. Clayborn Bigness, cardiologist, in 1 week   Discharge Diagnosis:   Principal Problem:   Symptomatic bradycardia    Discharge Condition: Stable.  Diet recommendation:  Diet Order             Diet - low sodium heart healthy           Diet Heart Room service appropriate? Yes; Fluid consistency: Thin  Diet effective now                     Code Status: Full Code     Hospital Course:   Angela Jensen is a 65 y.o. female medical history significant for COPD with chronic respiratory failure on 2.5 L of oxygen, chronic systolic CHF, hypertension, history of Lung CA (  RUL adenocarcinoma s/p radiation 2013 and 2021), depressison, DM, PVD.  She was recently admitted on 07/08/2021 for symptomatic bradycardia and presyncope and she remained hemodynamically stable throughout her admission.  She was discharged on 07/10/2021 with instructions from her cardiologist to follow-up in the office for further management..   She presented to the hospital with dizziness, lightheadedness and syncope. She was found to have complete heart block, COPD exacerbation and acute on chronic hypoxemic respiratory failure.  She was intubated for airway protection and acute respiratory failure.  Temporary transvenous pacemaker was placed 08/05/2021.  She was treated with IV dopamine infusion, steroids and bronchodilators.  She was also given IV Lasix.  She was transferred to the hospitalist service on 07/13/2021.  She underwent permanent pacemaker placement on 07/14/2021.  Her condition has improved and she is deemed stable for discharge to home today.  She sees a pulmonologist and  oncologist in the outpatient setting for history of lung cancer.    Medical Consultants:   Cardiologist Intensivist   Discharge Exam:    Vitals:   07/15/21 0700 07/15/21 0757 07/15/21 0954 07/15/21 1201  BP: 112/83  (!) 154/91   Pulse: 85  93   Resp: 12     Temp: 98.1 F (36.7 C)     TempSrc: Oral     SpO2: 100% 100%  99%  Weight:      Height:         GEN: NAD SKIN: No rash EYES: EOMI ENT: MMM CV: RRR PULM: CTA B ABD: soft, ND, NT, +BS CNS: AAO x 3, non focal EXT: No edema or tenderness   The results of significant diagnostics from this hospitalization (including imaging, microbiology, ancillary and laboratory) are listed below for reference.     Procedures and Diagnostic Studies:   DG Chest 1 View  Result Date: 07/11/2021 CLINICAL DATA:  Pacemaker.  Intubated. EXAM: PORTABLE CHEST 1 VIEW COMPARISON:  Chest XR, 07/11/2021.  CT chest , 07/11/2021. FINDINGS: Support lines: ETT with tip within the mid-to-distal thoracic trachea, unchanged. RIGHT IJ CVC with catheter tip within the RIGHT atrium. NG tube with tip excluded from. Overlying pacer leads. Cardiomediastinal silhouette is unchanged. Well inflated lungs with perihilar and basilar coarse interstitial opacities. No pleural effusion pneumothorax. No osseous abnormality. IMPRESSION: 1. Lines and tubes as above. 2. Unchanged pulmonary findings. Electronically Signed   By: Michaelle Birks M.D.   On: 07/11/2021  08:05   DG Abd 1 View  Result Date: 07/11/2021 CLINICAL DATA:  OG tube placement EXAM: ABDOMEN - 1 VIEW COMPARISON:  None. FINDINGS: Enteric tube passes into the body of the stomach. Included bowel gas pattern is unremarkable. IMPRESSION: Enteric tube within stomach. Electronically Signed   By: Macy Mis M.D.   On: 07/11/2021 08:22   CT Angio Chest PE W and/or Wo Contrast  Result Date: 07/11/2021 CLINICAL DATA:  reports of shortness of breath, pt was discharged from hospital about 3 hours prior to  arrival, pt states when she left she was able to eat, but then after while became short of breath. History of lung cancer. EXAM: CT ANGIOGRAPHY CHEST WITH CONTRAST TECHNIQUE: Multidetector CT imaging of the chest was performed using the standard protocol during bolus administration of intravenous contrast. Multiplanar CT image reconstructions and MIPs were obtained to evaluate the vascular anatomy. CONTRAST:  8m OMNIPAQUE IOHEXOL 350 MG/ML SOLN COMPARISON:  CT angio chest 06/26/2021, CT angiography chest 05/01/2021, CT angiography chest 03/31/2021, chest x-ray 08/01/2011, CT chest 08/01/2015, CT chest 02/09/2021 FINDINGS: Cardiovascular: Fair opacification of the pulmonary arteries to the segmental level. No evidence of pulmonary embolism. Normal heart size. No significant pericardial effusion. The thoracic aorta is normal in caliber. Mild atherosclerotic plaque of the thoracic aorta. No coronary artery calcifications. Mediastinum/Nodes: No enlarged mediastinal, hilar, or axillary lymph nodes. Thyroid gland, trachea, and esophagus demonstrate no significant findings. Lungs/Pleura: Limited evaluation due to respiratory motion artifact. At least moderate centrilobular emphysematous changes with similar-appearing reticulations along the peripheral right lower lobe. Linear atelectasis versus scarring again noted within the right lower lobe. Difficult to measure but likely interval increase in size and conspicuity over several months of a 0.8 x 1.3 cm nodular-like density along the subpleural right apex and a couple of nodular-like densities along the right major and minor fissures: 1.5 x 1.6 cm (7:43) and 1.7 x 1.1 cm (7:49, 8:17-22). No focal consolidation. No pleural effusion.  No pneumothorax. Upper Abdomen: No acute abnormality. Musculoskeletal: No chest wall abnormality. No suspicious lytic or blastic osseous lesions. No acute displaced fracture. Review of the MIP images confirms the above findings. IMPRESSION:  1. No pulmonary embolus. 2. Slightly increased in size and conspicuity over several months of larger than 1 cm nodular-like densities along the right minor and major fissures in the region of prior radiation therapy. Similarly slight interval increase in size of the right apex subpleural nodule. Recurrent malignancy cannot be excluded. Additional imaging evaluation or consultation with Pulmonology or Thoracic Surgery recommended. 3. Aortic Atherosclerosis (ICD10-I70.0) and Emphysema (ICD10-J43.9). Electronically Signed   By: MIven FinnM.D.   On: 07/11/2021 01:53   CARDIAC CATHETERIZATION  Result Date: 07/11/2021 Successful placement of transvenous temporary pacemaker via right internal jugular vein   DG Chest Port 1 View  Result Date: 07/11/2021 CLINICAL DATA:  Intubation. EXAM: PORTABLE CHEST 1 VIEW COMPARISON:  Chest radiograph dated 07/10/2021. FINDINGS: Endotracheal tube with tip approximately 3 cm above the carina. No interval change in bilateral pulmonary opacities compared to the prior radiograph. No large pleural effusion or pneumothorax. Stable cardiomediastinal silhouette. No acute osseous pathology. IMPRESSION: Endotracheal tube with tip above the carina. Electronically Signed   By: AAnner CreteM.D.   On: 07/11/2021 03:52   DG Chest Portable 1 View  Result Date: 07/10/2021 CLINICAL DATA:  Rule out pneumonia. EXAM: PORTABLE CHEST 1 VIEW COMPARISON:  Chest radiograph dated 06/26/2021. Chest CT dated 06/26/2021. FINDINGS: Background of emphysema. Overall similar appearance of bilateral  lower lung field airspace densities compared to prior radiograph. No new consolidation. There is no pleural effusion pneumothorax. Stable cardiac silhouette. No acute osseous pathology. IMPRESSION: No significant interval change. Electronically Signed   By: Anner Crete M.D.   On: 07/10/2021 20:29     Labs:   Basic Metabolic Panel: Recent Labs  Lab 07/11/21 0644 07/11/21 0645  07/12/21 0313 07/13/21 0506 07/14/21 0513 07/14/21 1428 07/15/21 0258  NA 139  --  142 140 139  --  135  K 4.6  --  3.7 3.3* 3.3* 3.3* 3.4*  CL 104  --  107 101 101  --  101  CO2 27  --  31 35* 32  --  30  GLUCOSE 401*  --  98 81 223*  --  244*  BUN 23  --  '16 16 19  ' --  21  CREATININE 0.97  --  0.42* 0.45 0.54  --  0.48  CALCIUM 8.0*  --  8.0* 8.2* 8.3*  --  8.4*  MG  --  2.3 2.3 2.4 2.3  --  2.2  PHOS  --  2.5 3.5 4.1 4.6  --  4.5   GFR Estimated Creatinine Clearance: 54.2 mL/min (by C-G formula based on SCr of 0.48 mg/dL). Liver Function Tests: Recent Labs  Lab 07/08/21 2132 07/10/21 2043 07/11/21 0644  AST 150* 57* 58*  ALT 134* 81* 81*  ALKPHOS 80 71 70  BILITOT 0.6 0.4 0.6  PROT 6.8 6.5 6.4*  ALBUMIN 3.7 3.4* 3.3*   No results for input(s): LIPASE, AMYLASE in the last 168 hours. No results for input(s): AMMONIA in the last 168 hours. Coagulation profile No results for input(s): INR, PROTIME in the last 168 hours.  CBC: Recent Labs  Lab 07/08/21 2132 07/09/21 0722 07/10/21 2043 07/11/21 0644 07/12/21 0313 07/13/21 0506 07/14/21 0513  WBC 5.1   < > 8.4 15.1* 11.2* 7.6 6.8  NEUTROABS 3.5  --  7.2 13.9*  --   --   --   HGB 10.5*   < > 10.1* 10.4* 8.4* 9.2* 9.6*  HCT 33.6*   < > 33.4* 34.6* 27.8* 29.9* 31.0*  MCV 94.9   < > 97.1 96.9 94.9 94.3 93.7  PLT 236   < > 207 238 176 185 181   < > = values in this interval not displayed.   Cardiac Enzymes: No results for input(s): CKTOTAL, CKMB, CKMBINDEX, TROPONINI in the last 168 hours. BNP: Invalid input(s): POCBNP CBG: Recent Labs  Lab 07/14/21 1959 07/14/21 2320 07/15/21 0533 07/15/21 0723 07/15/21 1133  GLUCAP 146* 254* 134* 135* 204*   D-Dimer No results for input(s): DDIMER in the last 72 hours. Hgb A1c No results for input(s): HGBA1C in the last 72 hours. Lipid Profile No results for input(s): CHOL, HDL, LDLCALC, TRIG, CHOLHDL, LDLDIRECT in the last 72 hours. Thyroid function studies No  results for input(s): TSH, T4TOTAL, T3FREE, THYROIDAB in the last 72 hours.  Invalid input(s): FREET3 Anemia work up No results for input(s): VITAMINB12, FOLATE, FERRITIN, TIBC, IRON, RETICCTPCT in the last 72 hours. Microbiology Recent Results (from the past 240 hour(s))  Resp Panel by RT-PCR (Flu A&B, Covid) Nasopharyngeal Swab     Status: None   Collection Time: 07/09/21  1:19 AM   Specimen: Nasopharyngeal Swab; Nasopharyngeal(NP) swabs in vial transport medium  Result Value Ref Range Status   SARS Coronavirus 2 by RT PCR NEGATIVE NEGATIVE Final    Comment: (NOTE) SARS-CoV-2 target nucleic acids are NOT  DETECTED.  The SARS-CoV-2 RNA is generally detectable in upper respiratory specimens during the acute phase of infection. The lowest concentration of SARS-CoV-2 viral copies this assay can detect is 138 copies/mL. A negative result does not preclude SARS-Cov-2 infection and should not be used as the sole basis for treatment or other patient management decisions. A negative result may occur with  improper specimen collection/handling, submission of specimen other than nasopharyngeal swab, presence of viral mutation(s) within the areas targeted by this assay, and inadequate number of viral copies(<138 copies/mL). A negative result must be combined with clinical observations, patient history, and epidemiological information. The expected result is Negative.  Fact Sheet for Patients:  EntrepreneurPulse.com.au  Fact Sheet for Healthcare Providers:  IncredibleEmployment.be  This test is no t yet approved or cleared by the [place] FDA and  has been authorized for detection and/or diagnosis of SARS-CoV-2 by FDA under an Emergency Use Authorization (EUA). This EUA will remain  in effect (meaning this test can be used) for the duration of the COVID-19 declaration under Section 564(b)(1) of the Act, 21 U.S.C.section 360bbb-3(b)(1), unless the  authorization is terminated  or revoked sooner.       Influenza A by PCR NEGATIVE NEGATIVE Final   Influenza B by PCR NEGATIVE NEGATIVE Final    Comment: (NOTE) The Xpert Xpress SARS-CoV-2/FLU/RSV plus assay is intended as an aid in the diagnosis of influenza from Nasopharyngeal swab specimens and should not be used as a sole basis for treatment. Nasal washings and aspirates are unacceptable for Xpert Xpress SARS-CoV-2/FLU/RSV testing.  Fact Sheet for Patients: EntrepreneurPulse.com.au  Fact Sheet for Healthcare Providers: IncredibleEmployment.be  This test is not yet approved or cleared by the [place] FDA and has been authorized for detection and/or diagnosis of SARS-CoV-2 by FDA under an Emergency Use Authorization (EUA). This EUA will remain in effect (meaning this test can be used) for the duration of the COVID-19 declaration under Section 564(b)(1) of the Act, 21 U.S.C. section 360bbb-3(b)(1), unless the authorization is terminated or revoked.  Performed at [facility], [address] [place], [place] [address]   MRSA Next Gen by PCR, Nasal     Status: None   Collection Time: 07/11/21  6:21 AM   Specimen: Nasal Mucosa; Nasal Swab  Result Value Ref Range Status   MRSA by PCR Next Gen NOT DETECTED NOT DETECTED Final    Comment: (NOTE) The GeneXpert MRSA Assay (FDA approved for NASAL specimens only), is one component of a comprehensive MRSA colonization surveillance program. It is not intended to diagnose MRSA infection nor to guide or monitor treatment for MRSA infections. Test performance is not FDA approved in patients less than 6 years old. Performed at [facility], [address] [place], [place] [address]   CULTURE, BLOOD (ROUTINE X 2) w Reflex to ID Panel     Status: None (Preliminary result)   Collection Time: 07/11/21  6:43 AM   Specimen: BLOOD  Result Value Ref Range Status   Specimen  Description BLOOD RIGHT [facility]  Final   Special Requests   Final    BOTTLES DRAWN AEROBIC AND ANAEROBIC Blood Culture adequate volume   Culture   Final    NO GROWTH 4 DAYS Performed at [facility], [address] [place], [place] [address]    Report Status PENDING  Incomplete  CULTURE, BLOOD (ROUTINE X 2) w Reflex to ID Panel     Status: None (Preliminary result)   Collection Time: 07/11/21  6:52 AM   Specimen: BLOOD  Result Value Ref Range Status   Specimen Description BLOOD RIGHT ARM  Final   Special Requests   Final    BOTTLES DRAWN AEROBIC AND ANAEROBIC Blood Culture adequate volume   Culture   Final    NO GROWTH 4 DAYS Performed at [facility], [address], [place], [place] [address]    Report Status PENDING  Incomplete  Culture, Respiratory w Gram Stain     Status: None   Collection Time: 07/11/21 12:23 PM   Specimen: Tracheal Aspirate; Respiratory  Result Value Ref Range Status   Specimen Description   Final    TRACHEAL ASPIRATE Performed at [facility], [address], [place], [place] [address]    Special Requests   Final    NONE Performed at [facility], [address] [place], [place] [address]    Gram Stain   Final    FEW WBC PRESENT, PREDOMINANTLY PMN NO ORGANISMS SEEN Performed at [facility], [place] [address], [place], [place] [address]    Culture RARE PSEUDOMONAS AERUGINOSA  Final   Report Status 07/14/2021 FINAL  Final   Organism ID, Bacteria PSEUDOMONAS AERUGINOSA  Final      Susceptibility   Pseudomonas aeruginosa - MIC*    CEFTAZIDIME 4 SENSITIVE Sensitive     CIPROFLOXACIN <=0.25 SENSITIVE Sensitive     GENTAMICIN <=1 SENSITIVE Sensitive     IMIPENEM 1 SENSITIVE Sensitive     PIP/TAZO 8 SENSITIVE Sensitive     CEFEPIME 2 SENSITIVE Sensitive     * RARE PSEUDOMONAS AERUGINOSA  Respiratory (~20 pathogens) panel by PCR     Status: None   Collection Time: 07/11/21 12:23 PM   Specimen: Nasopharyngeal  Swab; Respiratory  Result Value Ref Range Status   Adenovirus NOT DETECTED NOT DETECTED Final   Coronavirus 229E NOT DETECTED NOT DETECTED Final    Comment: (NOTE) The Coronavirus on the Respiratory Panel, DOES NOT test for the novel  Coronavirus (2019 nCoV)    Coronavirus HKU1 NOT DETECTED NOT DETECTED Final   Coronavirus NL63 NOT DETECTED NOT DETECTED Final   Coronavirus OC43 NOT DETECTED NOT DETECTED Final   Metapneumovirus NOT DETECTED NOT DETECTED Final   Rhinovirus / Enterovirus NOT DETECTED NOT DETECTED Final   Influenza A NOT DETECTED NOT DETECTED Final   Influenza B NOT DETECTED NOT DETECTED Final   Parainfluenza Virus 1 NOT DETECTED NOT DETECTED Final   Parainfluenza Virus 2 NOT DETECTED NOT DETECTED Final   Parainfluenza Virus 3 NOT DETECTED NOT DETECTED Final   Parainfluenza Virus 4 NOT DETECTED NOT DETECTED Final   Respiratory Syncytial Virus NOT DETECTED NOT DETECTED Final   Bordetella pertussis NOT DETECTED NOT DETECTED Final   Bordetella Parapertussis NOT DETECTED NOT DETECTED Final   Chlamydophila pneumoniae NOT DETECTED NOT DETECTED Final   Mycoplasma pneumoniae NOT DETECTED NOT DETECTED Final    Comment: Performed at [facility], [place], [place], [place] [address]  CULTURE, BLOOD (ROUTINE X 2) w Reflex to ID Panel     Status: None (Preliminary result)   Collection Time: 07/11/21  1:44 PM   Specimen: BLOOD  Result Value Ref Range Status   Specimen Description BLOOD RIGHT ANTECUBITAL  Final   Special Requests   Final    BOTTLES DRAWN AEROBIC AND ANAEROBIC Blood Culture adequate volume   Culture   Final    NO GROWTH 4 DAYS Performed at [facility], [address] [place], [place] [address]    Report Status PENDING  Incomplete  CULTURE, BLOOD (ROUTINE X 2) w Reflex to ID Panel     Status: None (Preliminary result)   Collection Time: 07/11/21  1:49 PM   Specimen: BLOOD  Result Value Ref Range Status   Specimen Description BLOOD BLOOD  LEFT HAND  Final   Special Requests   Final    BOTTLES DRAWN AEROBIC AND ANAEROBIC Blood Culture adequate volume   Culture   Final    NO GROWTH 4 DAYS Performed at [facility], [address], [place],  [address]    Report Status PENDING  Incomplete     Discharge Instructions:   Discharge Instructions     Diet - low sodium heart healthy   Complete by: As directed    Increase activity slowly   Complete by: As directed       Allergies as of 07/15/2021       Reactions   Trelegy Ellipta [fluticasone-umeclidin-vilant]         Medication List     TAKE these medications    Accu-Chek Guide test strip Generic drug: glucose blood USE  STRIP TO CHECK GLUCOSE TWICE DAILY   Accu-Chek Guide w/Device Kit Use as directed Dx e11.65   Accu-Chek Softclix Lancets lancets Use as instructed for twice a daily Dx E11.65   albuterol 108 (90 Base) MCG/ACT inhaler Commonly known as: VENTOLIN HFA INHALE 2 PUFFS BY MOUTH EVERY 6 HOURS AS NEEDED FOR WHEEZING FOR SHORTNESS OF BREATH   albuterol (2.5 MG/3ML) 0.083% nebulizer solution Commonly known as: PROVENTIL USE 1 VIAL IN NEBULIZER EVERY 6 HOURS AS NEEDED FOR WHEEZING   ALPRAZolam 0.25 MG tablet Commonly known as: XANAX Take 1 tablet (0.25 mg total) by mouth 3 (three) times daily as needed for anxiety or sleep.   clopidogrel 75 MG tablet Commonly known as: PLAVIX Take 1 tablet by mouth once daily   escitalopram 10 MG tablet Commonly known as: LEXAPRO Take 1 tablet (10 mg total) by mouth daily.   famotidine 20 MG tablet Commonly known as: PEPCID Take 1 tablet (20 mg total) by mouth daily for 14 days.   ferrous sulfate 325 (65 FE) MG tablet Take 325 mg by mouth 2 (two) times daily with a meal.   Fish Oil 1000 MG Caps Take 1 capsule by mouth daily.   fluticasone 50 MCG/ACT nasal spray Commonly known as: FLONASE Place 2 sprays into both nostrils daily.   gabapentin 100 MG capsule Commonly known as:  NEURONTIN Take 1 capsule (100 mg total) by mouth 2 (two) times daily.   Levemir FlexTouch 100 UNIT/ML FlexPen Generic drug: insulin detemir Inject 8- 12 units with supper qd// with needles   lisinopril 10 MG tablet Commonly known as: ZESTRIL Take 1 tablet (10 mg total) by mouth daily.   metFORMIN 500 MG tablet Commonly known as: GLUCOPHAGE Take 1 tablet (500 mg total) by mouth daily with breakfast.   metoprolol tartrate 25 MG tablet Commonly known as: LOPRESSOR Take 0.5 tablets (12.5 mg total) by mouth 2 (two) times daily.   mometasone-formoterol 200-5 MCG/ACT Aero Commonly known as: DULERA Inhale 2 puffs into the lungs 2 (two) times daily.   OXYGEN Inhale into the lungs.   potassium chloride 10 MEQ tablet Commonly known as: KLOR-CON TAKE 1 TABLET BY MOUTH EVERY OTHER DAY   Spiriva HandiHaler 18 MCG inhalation capsule Generic drug: tiotropium INHALE THE CONTENTS OF ONE CAPSULE BY MOUTH ONCE DAILY. DO  NOT SWALLOW CAPSULE.        Follow-up Information     Allyne Gee, MD. Schedule an appointment as soon as possible for a visit in 3 days.   Specialties: Internal Medicine, Pulmonary Disease Contact information: [place] [address] [phone]         Yolonda Kida, MD. Go in 1 week(s).   Specialties: Cardiology, Internal Medicine Contact information: [place] [address] [phone]                   If you experience worsening of your admission symptoms, develop shortness of breath, life threatening emergency, suicidal or homicidal thoughts you must seek medical attention immediately by calling 911 or calling your MD immediately  if symptoms less severe.   You must read complete instructions/literature along with all the possible adverse reactions/side effects for all the medicines you take and that have been prescribed to you. Take any new medicines after you have completely understood and accept all  the possible adverse reactions/side effects.    Please note   You were cared for by a hospitalist during your hospital stay. If you have any questions about your discharge medications or the care you received while you were in the hospital after you are discharged, you can call the unit and asked to speak with the hospitalist on call if the hospitalist that took care of you is not available. Once you are discharged, your primary care physician will handle any further medical issues. Please note that NO REFILLS for any discharge medications will be authorized once you are discharged, as it is imperative that you return to your primary care physician (or establish a relationship with a primary care physician if you do not have one) for your aftercare needs so that they can reassess your need for medications and monitor your lab values.       Time coordinating discharge: 32 minutes  Signed:  Jennye Boroughs  [facility] 07/15/2021, 1:29 PM   Pager on [email or web address]. If 7PM-7AM, please contact night-coverage at [email or web address]

## 2021-07-15 NOTE — Progress Notes (Signed)
Patient transferred to wheelchair and escorted out with tech and family.

## 2021-07-15 NOTE — Progress Notes (Signed)
[facility] Cardiology    SUBJECTIVE: The patient reports feeling well this morning. She does have mild tenderness to palpation of insertion site at left groin, but denies pain or drainage otherwise.    Vitals:   07/15/21 0500 07/15/21 0700 07/15/21 0757 07/15/21 0954  BP: 112/68 112/83  (!) 154/91  Pulse: 86 85  93  Resp: 19 12    Temp:  98.1 F (36.7 C)    TempSrc:  Oral    SpO2: 99% 100% 100%   Weight: 57.7 kg     Height:         Intake/Output Summary (Last 24 hours) at 07/15/2021 1047 Last data filed at 07/15/2021 2355 Gross per 24 hour  Intake 962 ml  Output 675 ml  Net 287 ml      PHYSICAL EXAM  General: Well developed, well nourished, in no acute distress, sitting up in recliner drinking coffee HEENT:  Normocephalic and atramatic Neck:  No JVD.  Lungs: Clear bilaterally to auscultation,. Normal effort of breathing on RA Heart: HRRR . Normal S1 and S2 without gallops or murmurs.  Abdomen: soft, nondistended Msk:  Normal strength and tone for age. Insertion site: R groin, suture removed, no drainage, no bulging, no apparent aneurysm, no surrounding erythema, edema, or warmth. Gauze and tegaderm applied.  Extremities: No clubbing, cyanosis or edema.   Neuro: Alert and oriented X 3. Psych:  Good affect, responds appropriately   LABS: Basic Metabolic Panel: Recent Labs    07/14/21 0513 07/14/21 1428 07/15/21 0258  NA 139  --  135  K 3.3* 3.3* 3.4*  CL 101  --  101  CO2 32  --  30  GLUCOSE 223*  --  244*  BUN 19  --  21  CREATININE 0.54  --  0.48  CALCIUM 8.3*  --  8.4*  MG 2.3  --  2.2  PHOS 4.6  --  4.5   Liver Function Tests: No results for input(s): AST, ALT, ALKPHOS, BILITOT, PROT, ALBUMIN in the last 72 hours. No results for input(s): LIPASE, AMYLASE in the last 72 hours. CBC: Recent Labs    07/13/21 0506 07/14/21 0513  WBC 7.6 6.8  HGB 9.2* 9.6*  HCT 29.9* 31.0*  MCV 94.3 93.7  PLT 185 181   Cardiac Enzymes: No results for input(s):  CKTOTAL, CKMB, CKMBINDEX, TROPONINI in the last 72 hours. BNP: Invalid input(s): POCBNP D-Dimer: No results for input(s): DDIMER in the last 72 hours. Hemoglobin A1C: No results for input(s): HGBA1C in the last 72 hours. Fasting Lipid Panel: No results for input(s): CHOL, HDL, LDLCALC, TRIG, CHOLHDL, LDLDIRECT in the last 72 hours. Thyroid Function Tests: No results for input(s): TSH, T4TOTAL, T3FREE, THYROIDAB in the last 72 hours.  Invalid input(s): FREET3 Anemia Panel: No results for input(s): VITAMINB12, FOLATE, FERRITIN, TIBC, IRON, RETICCTPCT in the last 72 hours.  EP PPM/ICD IMPLANT  Result Date: 07/14/2021 Successful Micra AV leadless pacemaker implantation      TELEMETRY: sinus rhythm, 90s bpm  ASSESSMENT AND PLAN:  Principal Problem:   Symptomatic bradycardia    1. Complete heart block, with syncope/collapse, status post temporary transvenous pacemaker right internal jugular vein, currently in sinus rhythm, in the setting of severe metabolic/lactic acidosis. Status post leadless pacemaker implantation 07/14/21 2.  Acute hypoxic respiratory failure secondary to COPD exacerbation, with severe metabolic and lactic acidosis in patient with known COPD, on chronic home O2 therapy, history of lung cancer, extubated, blood cultures negative 3.  Chronic systolic congestive heart  failure, LVEF 45 to 50% 2D echocardiogram 04/03/2021 4.  Peripheral vascular disease, on Plavix 5.  Hypertension, on metoprolol tartrate and lisinopril at home.  Recommendations: Suture removed, gauze and Tegaderm applied. Aftercare instructions discussed and included in AVS.  From cardiovascular perspective, consider discharge when cleared by hospitalist. Recommend ambulation today to assess functional status before discharge. Follow up with Dr. Clayborn Bigness in 1 week; appointment request placed. Continue metoprolol tartrate Resume Plavix on 07/16/21 Resume home lisinopril    Clabe Seal,  PA-C 07/15/2021 10:47 AM

## 2021-07-16 LAB — CULTURE, BLOOD (ROUTINE X 2)
Culture: NO GROWTH
Culture: NO GROWTH
Culture: NO GROWTH
Culture: NO GROWTH
Special Requests: ADEQUATE
Special Requests: ADEQUATE
Special Requests: ADEQUATE
Special Requests: ADEQUATE

## 2021-07-18 ENCOUNTER — Other Ambulatory Visit: Payer: Self-pay

## 2021-07-18 ENCOUNTER — Encounter: Payer: Self-pay | Admitting: Physician Assistant

## 2021-07-18 ENCOUNTER — Ambulatory Visit: Payer: Medicare Other | Admitting: Physician Assistant

## 2021-07-18 DIAGNOSIS — Z09 Encounter for follow-up examination after completed treatment for conditions other than malignant neoplasm: Secondary | ICD-10-CM

## 2021-07-18 DIAGNOSIS — Z950 Presence of cardiac pacemaker: Secondary | ICD-10-CM | POA: Diagnosis not present

## 2021-07-18 DIAGNOSIS — J449 Chronic obstructive pulmonary disease, unspecified: Secondary | ICD-10-CM | POA: Diagnosis not present

## 2021-07-18 DIAGNOSIS — J9611 Chronic respiratory failure with hypoxia: Secondary | ICD-10-CM | POA: Diagnosis not present

## 2021-07-18 DIAGNOSIS — I10 Essential (primary) hypertension: Secondary | ICD-10-CM

## 2021-07-18 MED ORDER — SPIRIVA HANDIHALER 18 MCG IN CAPS: ORAL_CAPSULE | RESPIRATORY_TRACT | 0 refills | Status: DC

## 2021-07-18 NOTE — Progress Notes (Signed)
[facility] [place], [place] [address]  Internal MEDICINE  Office Visit Note  Patient Name: Angela Jensen  [id number]  [id number]  Date of Service: 07/20/2021     Chief Complaint  Patient presents with   Hospitalization Follow-up     HPI Pt is here for recent hospital follow up with her daughters. -She was in the ED from 11/22-11/24, but was then discharged only to be admitted again the same day until 11/29. She was hospitalized due to symptomatic bradycardia with dizziness and syncope. She had complete heart block as well as COPD exacerbation with acute on chronic respiratory failure. She was intubated, given IV lasix, as well as dopamine infusion, steroids, and bronchodilators. She had a permanent pacemaker placed on 07/14/21. -2-3L as needed, on continuous oxygen. Reports she is doing better now  -Sees the cardiologist who placed the pacemaker on Monday, then sees Dr. Clayborn Bigness -Cant use trelegy because it led to SOB and went to ED, therefore she continues to use spiriva, dulera, albuterol  Current Medication: Outpatient Encounter Medications as of 07/18/2021  Medication Sig   Accu-Chek Softclix Lancets lancets Use as instructed for twice a daily Dx E11.65   albuterol (PROVENTIL) (2.5 MG/3ML) 0.083% nebulizer solution USE 1 VIAL IN NEBULIZER EVERY 6 HOURS AS NEEDED FOR WHEEZING   albuterol (VENTOLIN HFA) 108 (90 Base) MCG/ACT inhaler INHALE 2 PUFFS BY MOUTH EVERY 6 HOURS AS NEEDED FOR WHEEZING FOR SHORTNESS OF BREATH   ALPRAZolam (XANAX) 0.25 MG tablet Take 1 tablet (0.25 mg total) by mouth 3 (three) times daily as needed for anxiety or sleep.   Blood Glucose Monitoring Suppl (ACCU-CHEK GUIDE) w/Device KIT Use as directed Dx e11.65   clopidogrel (PLAVIX) 75 MG tablet Take 1 tablet by mouth once daily   escitalopram (LEXAPRO) 10 MG tablet Take 1 tablet (10 mg total) by mouth daily.   ferrous sulfate 325 (65 FE) MG tablet Take 325 mg by mouth 2 (two) times  daily with a meal.   fluticasone (FLONASE) 50 MCG/ACT nasal spray Place 2 sprays into both nostrils daily.   gabapentin (NEURONTIN) 100 MG capsule Take 1 capsule (100 mg total) by mouth 2 (two) times daily.   glucose blood (ACCU-CHEK GUIDE) test strip USE  STRIP TO CHECK GLUCOSE TWICE DAILY   insulin detemir (LEVEMIR FLEXTOUCH) 100 UNIT/ML FlexPen Inject 8- 12 units with supper qd// with needles   lisinopril (ZESTRIL) 10 MG tablet Take 1 tablet (10 mg total) by mouth daily.   metFORMIN (GLUCOPHAGE) 500 MG tablet Take 1 tablet (500 mg total) by mouth daily with breakfast.   metoprolol tartrate (LOPRESSOR) 25 MG tablet Take 0.5 tablets (12.5 mg total) by mouth 2 (two) times daily.   mometasone-formoterol (DULERA) 200-5 MCG/ACT AERO Inhale 2 puffs into the lungs 2 (two) times daily.   Omega-3 Fatty Acids (FISH OIL) 1000 MG CAPS Take 1 capsule by mouth daily.   OXYGEN Inhale into the lungs.   potassium chloride (KLOR-CON) 10 MEQ tablet TAKE 1 TABLET BY MOUTH EVERY OTHER DAY   [DISCONTINUED] tiotropium (SPIRIVA HANDIHALER) 18 MCG inhalation capsule INHALE THE CONTENTS OF ONE CAPSULE BY MOUTH ONCE DAILY. DO NOT SWALLOW CAPSULE.   famotidine (PEPCID) 20 MG tablet Take 1 tablet (20 mg total) by mouth daily for 14 days.   tiotropium (SPIRIVA HANDIHALER) 18 MCG inhalation capsule INHALE THE CONTENTS OF ONE CAPSULE BY MOUTH ONCE DAILY. DO NOT SWALLOW CAPSULE.   No facility-administered encounter medications on file as of 07/18/2021.  Surgical History: Past Surgical History:  Procedure Laterality Date   CESAREAN SECTION     x3   COLONOSCOPY WITH PROPOFOL N/A 06/25/2015   Procedure: COLONOSCOPY WITH PROPOFOL;  Surgeon: Lucilla Lame, MD;  Location: [facility];  Service: Endoscopy;  Laterality: N/A;   COLONOSCOPY WITH PROPOFOL N/A 07/26/2020   Procedure: COLONOSCOPY WITH PROPOFOL;  Surgeon: Lucilla Lame, MD;  Location: [place];  Service: Endoscopy;  Laterality: N/A;   CYST REMOVAL LEG      and on shoulder    ESOPHAGOGASTRODUODENOSCOPY (EGD) WITH PROPOFOL N/A 07/26/2020   Procedure: ESOPHAGOGASTRODUODENOSCOPY (EGD) WITH PROPOFOL;  Surgeon: Lucilla Lame, MD;  Location: [place];  Service: Endoscopy;  Laterality: N/A;  Diabetic - oral meds   LOWER EXTREMITY ANGIOGRAPHY Left 09/29/2018   Procedure: LOWER EXTREMITY ANGIOGRAPHY;  Surgeon: Algernon Huxley, MD;  Location: [place] [facility];  Service: Cardiovascular;  Laterality: Left;   LUNG BIOPSY  05 15 2013   has lung "spots"   PACEMAKER LEADLESS INSERTION N/A 07/14/2021   Procedure: PACEMAKER LEADLESS INSERTION;  Surgeon: Isaias Cowman, MD;  Location: [place] [facility];  Service: Cardiovascular;  Laterality: N/A;   PERIPHERAL VASCULAR CATHETERIZATION Left 06/01/2016   Procedure: Lower Extremity Angiography;  Surgeon: Algernon Huxley, MD;  Location: [place] [facility];  Service: Cardiovascular;  Laterality: Left;   PERIPHERAL VASCULAR CATHETERIZATION N/A 06/01/2016   Procedure: Abdominal Aortogram w/Lower Extremity;  Surgeon: Algernon Huxley, MD;  Location: [place] [facility];  Service: Cardiovascular;  Laterality: N/A;   PERIPHERAL VASCULAR CATHETERIZATION  06/01/2016   Procedure: Lower Extremity Intervention;  Surgeon: Algernon Huxley, MD;  Location: [place] [facility];  Service: Cardiovascular;;   PERIPHERAL VASCULAR CATHETERIZATION Right 06/08/2016   Procedure: Lower Extremity Angiography;  Surgeon: Algernon Huxley, MD;  Location: [place] [facility];  Service: Cardiovascular;  Laterality: Right;   PERIPHERAL VASCULAR CATHETERIZATION  06/08/2016   Procedure: Lower Extremity Intervention;  Surgeon: Algernon Huxley, MD;  Location: [place] [facility];  Service: Cardiovascular;;   SUBMANDIBULAR GLAND EXCISION Left 12/06/2020   Procedure: EXCISION SUBMANDIBULAR GLAND;  Surgeon: Beverly Gust, MD;  Location: [place];  Service: ENT;  Laterality: Left;  needs to be first case Diabetic - diet controlled    TEMPORARY PACEMAKER N/A 07/11/2021   Procedure: TEMPORARY PACEMAKER;  Surgeon: Isaias Cowman, MD;  Location: [place] [facility];  Service: Cardiovascular;  Laterality: N/A;    Medical History: Past Medical History:  Diagnosis Date   Anemia    Arthritis    Asthma    Atherosclerosis of native arteries of extremity with intermittent claudication ([place]) 05/26/2016   Cancer ([place]) 2012   Right Lung CA   COPD (chronic obstructive pulmonary disease) ([place])    Depression    Diabetes mellitus without complication ([place])    Patient takes Janumet   Essential hypertension 05/26/2016   Hypercholesteremia    Oxygen dependent    2L at nite    PAD (peripheral artery disease) ([place]) 06/22/2016   Peripheral vascular disease (HCC)    Personal history of radiation therapy    Shortness of breath dyspnea    with exertion    Sialolithiasis    Sleep apnea    Wears dentures    full upper and lower    Family History: Family History  Problem Relation Age of Onset   Lung cancer Father    Diabetes Mother    Hypercholesterolemia Mother    Diabetes Sister  Diabetes Maternal Grandmother    Diabetes Paternal Grandmother    Diabetes Sister    Heart attack Brother    Coronary artery disease Brother    Vascular Disease Brother    Hypertension Sister    Heart attack Brother     Social History   Socioeconomic History   Marital status: Widowed    Spouse name: Not on file   Number of children: Not on file   Years of education: Not on file   Highest education level: Not on file  Occupational History   Not on file  Tobacco Use   Smoking status: Former    Packs/day: 1.00    Years: 37.00    Pack years: 37.00    Types: Cigarettes    Quit date: 02/06/2010    Years since quitting: 11.4   Smokeless tobacco: Former    Types: Snuff  Vaping Use   Vaping Use: Never used  Substance and Sexual Activity   Alcohol use: Not Currently    Alcohol/week: 5.0 standard drinks    Types: 5 Cans of beer  per week    Comment: /h x of alcohol abuse -stopped 2012- now drinks 5 beer per week   Drug use: Not Currently    Types: Marijuana, "Crack" cocaine, Cocaine    Comment: hx of cocaine use- last use 2015; last use marijuana6/22/19,   Sexual activity: Yes  Other Topics Concern   Not on file  Social History Narrative   Lives with Significant Other x 43 years   Social Determinants of Health   Financial Resource Strain: Not on file  Food Insecurity: Not on file  Transportation Needs: Not on file  Physical Activity: Not on file  Stress: Not on file  Social Connections: Not on file  Intimate Partner Violence: Not on file      Review of Systems  Constitutional:  Negative for chills, diaphoresis and fatigue.  HENT:  Negative for ear pain, postnasal drip and sinus pressure.   Eyes:  Negative for photophobia, discharge, redness, itching and visual disturbance.  Respiratory:  Positive for shortness of breath. Negative for cough and wheezing.   Cardiovascular:  Negative for chest pain, palpitations and leg swelling.  Gastrointestinal:  Negative for abdominal pain, constipation, diarrhea, nausea and vomiting.  Genitourinary:  Negative for dysuria and flank pain.  Musculoskeletal:  Negative for arthralgias, back pain, gait problem and neck pain.  Skin:  Negative for color change.  Allergic/Immunologic: Negative for environmental allergies and food allergies.  Neurological:  Positive for weakness. Negative for dizziness and headaches.  Hematological:  Does not bruise/bleed easily.  Psychiatric/Behavioral:  Negative for agitation, behavioral problems (depression) and hallucinations.    Vital Signs: BP (!) 144/87   Pulse 98   Temp 98.3 F (36.8 C)   Resp 16   Ht '4\' 11"'  (1.499 m)   Wt 122 lb 12.8 oz (55.7 kg)   SpO2 97% Comment: 2 liters oxygen  BMI 24.80 kg/m    Physical Exam Vitals and nursing note reviewed.  Constitutional:      Appearance: Normal appearance. She is normal  weight.  HENT:     Head: Normocephalic and atraumatic.     Nose: Nose normal.     Mouth/Throat:     Mouth: Mucous membranes are moist.     Pharynx: No posterior oropharyngeal erythema.  Eyes:     Extraocular Movements: Extraocular movements intact.     Pupils: Pupils are equal, round, and reactive to light.  Cardiovascular:  Rate and Rhythm: Normal rate and regular rhythm.     Pulses: Normal pulses.     Heart sounds: Normal heart sounds.  Pulmonary:     Effort: Pulmonary effort is normal.     Breath sounds: Wheezing present.  Neurological:     General: No focal deficit present.     Mental Status: She is alert.  Psychiatric:        Mood and Affect: Mood normal.        Behavior: Behavior normal.      Assessment/Plan: 1. Encounter for examination following treatment at hospital Reviewed hospital course  2. Chronic obstructive pulmonary disease, unspecified COPD type ([place]) Will continue current medications. Discussed considering Breztri, but pt resistant due to poor experience with trelegy as triple therapy. Will move up follow up with Dr. Humphrey Rolls for closer pulmonary follow ups given multiple exacerbations - tiotropium (SPIRIVA HANDIHALER) 18 MCG inhalation capsule; INHALE THE CONTENTS OF ONE CAPSULE BY MOUTH ONCE DAILY. DO NOT SWALLOW CAPSULE.  Dispense: 90 capsule; Refill: 0  3. Chronic respiratory failure with hypoxia (HCC) Continue oxygen  4. S/P placement of cardiac pacemaker Followed by cardiology  5. Essential hypertension Generally stable, continue to monitor   General Counseling: Aizlyn verbalizes understanding of the findings of todays visit and agrees with plan of treatment. I have discussed any further diagnostic evaluation that may be needed or ordered today. We also reviewed her medications today. she has been encouraged to call the office with any questions or concerns that should arise related to todays visit.    Counseling:    No orders of the defined  types were placed in this encounter.   This patient was seen by Drema Dallas, PA-C in collaboration with Dr. Clayborn Bigness as a part of collaborative care agreement.   I have reviewed all medical records from hospital follow up including radiology reports and consults from other physicians. Appropriate follow up diagnostics will be scheduled as needed. Patient/ Family understands the plan of treatment. Time spent40 minutes.   Dr [name], MD Internal Medicine

## 2021-07-28 ENCOUNTER — Telehealth: Payer: Self-pay

## 2021-07-28 NOTE — Telephone Encounter (Signed)
Left vm to confirm 07/30/21 appointment-Toni

## 2021-07-29 LAB — PULMONARY FUNCTION TEST

## 2021-07-30 ENCOUNTER — Other Ambulatory Visit: Payer: Self-pay

## 2021-07-30 ENCOUNTER — Ambulatory Visit (INDEPENDENT_AMBULATORY_CARE_PROVIDER_SITE_OTHER): Payer: Medicare Other | Admitting: Nurse Practitioner

## 2021-07-30 ENCOUNTER — Encounter: Payer: Self-pay | Admitting: Nurse Practitioner

## 2021-07-30 VITALS — BP 113/68 | HR 93 | Temp 98.2°F | Resp 16 | Ht 59.0 in | Wt 120.0 lb

## 2021-07-30 DIAGNOSIS — M19021 Primary osteoarthritis, right elbow: Secondary | ICD-10-CM

## 2021-07-30 DIAGNOSIS — Z0001 Encounter for general adult medical examination with abnormal findings: Secondary | ICD-10-CM

## 2021-07-30 DIAGNOSIS — E1165 Type 2 diabetes mellitus with hyperglycemia: Secondary | ICD-10-CM

## 2021-07-30 DIAGNOSIS — R300 Dysuria: Secondary | ICD-10-CM

## 2021-07-30 DIAGNOSIS — Z950 Presence of cardiac pacemaker: Secondary | ICD-10-CM | POA: Diagnosis not present

## 2021-07-30 DIAGNOSIS — Z780 Asymptomatic menopausal state: Secondary | ICD-10-CM

## 2021-07-30 DIAGNOSIS — F419 Anxiety disorder, unspecified: Secondary | ICD-10-CM | POA: Diagnosis not present

## 2021-07-30 DIAGNOSIS — C3431 Malignant neoplasm of lower lobe, right bronchus or lung: Secondary | ICD-10-CM | POA: Diagnosis not present

## 2021-07-30 MED ORDER — ACCU-CHEK GUIDE VI STRP: ORAL_STRIP | 3 refills | Status: DC

## 2021-07-30 MED ORDER — TRAMADOL HCL 50 MG PO TABS: 50.0000 mg | ORAL_TABLET | Freq: Four times a day (QID) | ORAL | 0 refills | Status: DC | PRN

## 2021-07-30 NOTE — Progress Notes (Signed)
[facility] [place], [place] [address]  Internal MEDICINE  Office Visit Note  Patient Name: Angela Jensen  [id number]  [id number]  Date of Service: 07/30/2021  Chief Complaint  Patient presents with   Annual Exam    Right arm pain, started about a week or two ago after pt stopped meloxicam    Hyperlipidemia   Hypertension   COPD   Depression   Diabetes   Asthma    HPI Kannon presents for an annual well visit and physical exam. She is a well appearing 65 yo female.  She had a recent hospitalization where she was found to have a complete heart block in had a pacemaker placed.  She also has lung cancer and is followed by Dr. Tasia Catchings.  She has multiple pulmonary nodules as well as a pelvic mass. According to all notes from oncology and, patient is not a good candidate for surgery due to her pulmonary function. She had a routine colonoscopy in 2021. She is due for a routine mammogram in January. She is due for BMD screening.  Patient has had pneumonia and been hospitalized for it twice this year while she was on trelegy. Judithann Sauger was discussed with patient but she decided to go back on the inhalers she was on before trelegy.  She has right arm pain that started about a week or 2 after stopped meloxicam.      Current Medication: Outpatient Encounter Medications as of 07/30/2021  Medication Sig   Accu-Chek Softclix Lancets lancets Use as instructed for twice a daily Dx E11.65   albuterol (PROVENTIL) (2.5 MG/3ML) 0.083% nebulizer solution USE 1 VIAL IN NEBULIZER EVERY 6 HOURS AS NEEDED FOR WHEEZING   Blood Glucose Monitoring Suppl (ACCU-CHEK GUIDE) w/Device KIT Use as directed Dx e11.65   clopidogrel (PLAVIX) 75 MG tablet Take 1 tablet by mouth once daily   escitalopram (LEXAPRO) 10 MG tablet Take 1 tablet (10 mg total) by mouth daily.   ferrous sulfate 325 (65 FE) MG tablet Take 325 mg by mouth 2 (two) times daily with a meal.   fluticasone (FLONASE) 50 MCG/ACT nasal  spray Place 2 sprays into both nostrils daily.   gabapentin (NEURONTIN) 100 MG capsule Take 1 capsule (100 mg total) by mouth 2 (two) times daily.   insulin detemir (LEVEMIR FLEXTOUCH) 100 UNIT/ML FlexPen Inject 8- 12 units with supper qd// with needles   lisinopril (ZESTRIL) 10 MG tablet Take 1 tablet (10 mg total) by mouth daily.   metFORMIN (GLUCOPHAGE) 500 MG tablet Take 1 tablet (500 mg total) by mouth daily with breakfast.   mometasone-formoterol (DULERA) 200-5 MCG/ACT AERO Inhale 2 puffs into the lungs 2 (two) times daily.   Omega-3 Fatty Acids (FISH OIL) 1000 MG CAPS Take 1 capsule by mouth daily.   OXYGEN Inhale into the lungs.   tiotropium (SPIRIVA HANDIHALER) 18 MCG inhalation capsule INHALE THE CONTENTS OF ONE CAPSULE BY MOUTH ONCE DAILY. DO NOT SWALLOW CAPSULE.   [DISCONTINUED] albuterol (VENTOLIN HFA) 108 (90 Base) MCG/ACT inhaler INHALE 2 PUFFS BY MOUTH EVERY 6 HOURS AS NEEDED FOR WHEEZING FOR SHORTNESS OF BREATH   [DISCONTINUED] ALPRAZolam (XANAX) 0.25 MG tablet TAKE 1 TABLET BY MOUTH THREE TIMES DAILY AS NEEDED FOR ANXIETY OR SLEEP   [DISCONTINUED] glucose blood (ACCU-CHEK GUIDE) test strip USE  STRIP TO CHECK GLUCOSE TWICE DAILY   [DISCONTINUED] metoprolol tartrate (LOPRESSOR) 25 MG tablet Take 0.5 tablets (12.5 mg total) by mouth 2 (two) times daily.   [DISCONTINUED] potassium  chloride (KLOR-CON) 10 MEQ tablet TAKE 1 TABLET BY MOUTH EVERY OTHER DAY   [DISCONTINUED] traMADol (ULTRAM) 50 MG tablet Take 1 tablet (50 mg total) by mouth every 6 (six) hours as needed for up to 5 days for moderate pain or severe pain.   ALPRAZolam (XANAX) 0.25 MG tablet Take 1 tablet (0.25 mg total) by mouth 3 (three) times daily as needed for anxiety or sleep.   [EXPIRED] amoxicillin-clavulanate (AUGMENTIN) 875-125 MG tablet Take 1 tablet by mouth every 12 (twelve) hours for 5 days.   famotidine (PEPCID) 20 MG tablet Take 1 tablet (20 mg total) by mouth daily for 14 days.   glucose blood (ACCU-CHEK  GUIDE) test strip USE  STRIP TO CHECK GLUCOSE TWICE DAILY   [EXPIRED] predniSONE (DELTASONE) 20 MG tablet Take 2 tablets (40 mg total) by mouth daily with breakfast for 3 days.   [DISCONTINUED] albuterol (VENTOLIN HFA) 108 (90 Base) MCG/ACT inhaler Inhale 2 puffs into the lungs every 6 (six) hours as needed for wheezing or shortness of breath.   [DISCONTINUED] ALPRAZolam (XANAX) 0.25 MG tablet Take 1 tablet (0.25 mg total) by mouth 3 (three) times daily as needed for anxiety or sleep.   [DISCONTINUED] carvedilol (COREG) 3.125 MG tablet Take 1 tablet (3.125 mg total) by mouth 2 (two) times daily with a meal.   [DISCONTINUED] clopidogrel (PLAVIX) 75 MG tablet Take 1 tablet (75 mg total) by mouth daily.   [DISCONTINUED] tiotropium (SPIRIVA HANDIHALER) 18 MCG inhalation capsule INHALE THE CONTENTS OF ONE CAPSULE BY MOUTH ONCE DAILY. DO NOT SWALLOW CAPSULE.   No facility-administered encounter medications on file as of 07/30/2021.    Surgical History: Past Surgical History:  Procedure Laterality Date   CESAREAN SECTION     x3   COLONOSCOPY WITH PROPOFOL N/A 06/25/2015   Procedure: COLONOSCOPY WITH PROPOFOL;  Surgeon: Lucilla Lame, MD;  Location: [facility];  Service: Endoscopy;  Laterality: N/A;   COLONOSCOPY WITH PROPOFOL N/A 07/26/2020   Procedure: COLONOSCOPY WITH PROPOFOL;  Surgeon: Lucilla Lame, MD;  Location: [place];  Service: Endoscopy;  Laterality: N/A;   CYST REMOVAL LEG     and on shoulder    ESOPHAGOGASTRODUODENOSCOPY (EGD) WITH PROPOFOL N/A 07/26/2020   Procedure: ESOPHAGOGASTRODUODENOSCOPY (EGD) WITH PROPOFOL;  Surgeon: Lucilla Lame, MD;  Location: [place];  Service: Endoscopy;  Laterality: N/A;  Diabetic - oral meds   LOWER EXTREMITY ANGIOGRAPHY Left 09/29/2018   Procedure: LOWER EXTREMITY ANGIOGRAPHY;  Surgeon: Algernon Huxley, MD;  Location: [place] [facility];  Service: Cardiovascular;  Laterality: Left;   LUNG BIOPSY  12/30/2011   has lung "spots"    PACEMAKER IMPLANT  07/14/2021   PACEMAKER LEADLESS INSERTION N/A 07/14/2021   Procedure: PACEMAKER LEADLESS INSERTION;  Surgeon: Isaias Cowman, MD;  Location: [place] [facility];  Service: Cardiovascular;  Laterality: N/A;   PERIPHERAL VASCULAR CATHETERIZATION Left 06/01/2016   Procedure: Lower Extremity Angiography;  Surgeon: Algernon Huxley, MD;  Location: [place] [facility];  Service: Cardiovascular;  Laterality: Left;   PERIPHERAL VASCULAR CATHETERIZATION N/A 06/01/2016   Procedure: Abdominal Aortogram w/Lower Extremity;  Surgeon: Algernon Huxley, MD;  Location: [place] [facility];  Service: Cardiovascular;  Laterality: N/A;   PERIPHERAL VASCULAR CATHETERIZATION  06/01/2016   Procedure: Lower Extremity Intervention;  Surgeon: Algernon Huxley, MD;  Location: [place] [facility];  Service: Cardiovascular;;   PERIPHERAL VASCULAR CATHETERIZATION Right 06/08/2016   Procedure: Lower Extremity Angiography;  Surgeon: Algernon Huxley, MD;  Location: [facility];  Service: Cardiovascular;  Laterality: Right;   PERIPHERAL VASCULAR CATHETERIZATION  06/08/2016   Procedure: Lower Extremity Intervention;  Surgeon: Algernon Huxley, MD;  Location: [place] [facility];  Service: Cardiovascular;;   SUBMANDIBULAR GLAND EXCISION Left 12/06/2020   Procedure: EXCISION SUBMANDIBULAR GLAND;  Surgeon: Beverly Gust, MD;  Location: [place];  Service: ENT;  Laterality: Left;  needs to be first case Diabetic - diet controlled   TEMPORARY PACEMAKER N/A 07/11/2021   Procedure: TEMPORARY PACEMAKER;  Surgeon: Isaias Cowman, MD;  Location: [place] [facility];  Service: Cardiovascular;  Laterality: N/A;    Medical History: Past Medical History:  Diagnosis Date   Anemia    Arthritis    Asthma    Atherosclerosis of native arteries of extremity with intermittent claudication ([place]) 05/26/2016   Cancer ([place]) 2012   Right Lung CA   COPD (chronic obstructive pulmonary disease) ([place])    Depression     Diabetes mellitus without complication ([place])    Patient takes Janumet   Essential hypertension 05/26/2016   Heart failure ([place]) 2022   Hypercholesteremia    Oxygen dependent    2L at nite    PAD (peripheral artery disease) ([place]) 06/22/2016   Peripheral vascular disease (HCC)    Personal history of radiation therapy    Shortness of breath dyspnea    with exertion    Sialolithiasis    Sleep apnea    Wears dentures    full upper and lower    Family History: Family History  Problem Relation Age of Onset   Lung cancer Father    Diabetes Mother    Hypercholesterolemia Mother    Diabetes Sister    Diabetes Maternal Grandmother    Diabetes Paternal Grandmother    Diabetes Sister    Heart attack Brother    Coronary artery disease Brother    Vascular Disease Brother    Hypertension Sister    Heart attack Brother     Social History   Socioeconomic History   Marital status: Widowed    Spouse name: Not on file   Number of children: Not on file   Years of education: Not on file   Highest education level: Not on file  Occupational History   Not on file  Tobacco Use   Smoking status: Former    Packs/day: 1.00    Years: 37.00    Pack years: 37.00    Types: Cigarettes    Quit date: 02/06/2010    Years since quitting: 11.5   Smokeless tobacco: Former    Types: Snuff  Vaping Use   Vaping Use: Never used  Substance and Sexual Activity   Alcohol use: Not Currently    Alcohol/week: 5.0 standard drinks    Types: 5 Cans of beer per week    Comment: /h x of alcohol abuse -stopped 2012- now drinks 5 beer per week   Drug use: Not Currently    Types: Marijuana, "Crack" cocaine, Cocaine    Comment: hx of cocaine use- last use 2015; last use marijuana6/22/19,   Sexual activity: Yes  Other Topics Concern   Not on file  Social History Narrative   Lives with Significant Other x 43 years   Social Determinants of Health   Financial Resource Strain: Not on file  Food  Insecurity: Not on file  Transportation Needs: Not on file  Physical Activity: Not on file  Stress: Not on file  Social Connections: Not on file  Intimate Partner Violence:  Not on file      Review of Systems  Constitutional:  Negative for activity change, appetite change, chills, fatigue, fever and unexpected weight change.  HENT: Negative.  Negative for congestion, ear pain, rhinorrhea, sore throat and trouble swallowing.   Eyes: Negative.   Respiratory: Negative.  Negative for cough, chest tightness, shortness of breath and wheezing.   Cardiovascular: Negative.  Negative for chest pain and palpitations.       Has pacemaker  Gastrointestinal: Negative.  Negative for abdominal pain, blood in stool, constipation, diarrhea, nausea and vomiting.  Endocrine: Negative.   Genitourinary: Negative.  Negative for difficulty urinating, dysuria, frequency, hematuria and urgency.  Musculoskeletal: Negative.  Negative for arthralgias, back pain, joint swelling, myalgias and neck pain.  Skin: Negative.  Negative for rash and wound.  Allergic/Immunologic: Negative.  Negative for immunocompromised state.  Neurological: Negative.  Negative for dizziness, seizures, numbness and headaches.  Hematological: Negative.   Psychiatric/Behavioral: Negative.  Negative for behavioral problems, self-injury and suicidal ideas. The patient is not nervous/anxious.    Vital Signs: BP 113/68    Pulse 93    Temp 98.2 F (36.8 C)    Resp 16    Ht '4\' 11"'  (1.499 m)    Wt 120 lb (54.4 kg)    SpO2 98%    BMI 24.24 kg/m    Physical Exam Vitals reviewed.  Constitutional:      General: She is not in acute distress.    Appearance: Normal appearance. She is well-developed and normal weight. She is not ill-appearing or diaphoretic.  HENT:     Head: Normocephalic and atraumatic.     Right Ear: Tympanic membrane, ear canal and external ear normal.     Left Ear: Tympanic membrane, ear canal and external ear normal.      Nose: Nose normal. No congestion or rhinorrhea.     Mouth/Throat:     Mouth: Mucous membranes are moist.     Pharynx: Oropharynx is clear. No oropharyngeal exudate or posterior oropharyngeal erythema.  Eyes:     General: No scleral icterus.       Right eye: No discharge.        Left eye: No discharge.     Extraocular Movements: Extraocular movements intact.     Conjunctiva/sclera: Conjunctivae normal.     Pupils: Pupils are equal, round, and reactive to light.  Neck:     Thyroid: No thyromegaly.     Vascular: No carotid bruit or JVD.     Trachea: No tracheal deviation.  Cardiovascular:     Rate and Rhythm: Normal rate and regular rhythm.     Pulses: Normal pulses.     Heart sounds: Normal heart sounds. No murmur heard.   No friction rub. No gallop.  Pulmonary:     Effort: Pulmonary effort is normal. No respiratory distress.     Breath sounds: Normal breath sounds. No stridor. No wheezing or rales.  Chest:     Chest wall: No tenderness.  Abdominal:     General: Bowel sounds are normal. There is no distension.     Palpations: Abdomen is soft. There is no mass.     Tenderness: There is no abdominal tenderness. There is no guarding or rebound.  Musculoskeletal:        General: No tenderness or deformity. Normal range of motion.     Cervical back: Normal range of motion and neck supple.  Lymphadenopathy:     Cervical: No cervical adenopathy.  Skin:  General: Skin is warm and dry.     Capillary Refill: Capillary refill takes less than 2 seconds.     Coloration: Skin is not pale.     Findings: No erythema or rash.  Neurological:     Mental Status: She is alert and oriented to person, place, and time.     Cranial Nerves: No cranial nerve deficit.     Motor: No abnormal muscle tone.     Coordination: Coordination normal.     Gait: Gait normal.     Deep Tendon Reflexes: Reflexes are normal and symmetric.  Psychiatric:        Mood and Affect: Mood normal.        Behavior:  Behavior normal.        Thought Content: Thought content normal.        Judgment: Judgment normal.       Assessment/Plan: 1. Encounter for routine adult health examination with abnormal findings Age-appropriate preventive screenings and vaccinations discussed, annual physical exam completed. Routine labs for health maintenance not ordered, she gets regular labs with oncology. PHM updated.   2. S/P placement of cardiac pacemaker Was recently hospitalized for symptomatic bradycardia, was found to have a complete heart block and had a pacemaker inserted   3. Cancer of lower lobe of right lung ([place]) Followed and managed by Dr. Tasia Catchings. Had a recent PET scan.   4. Anxiety Refills ordered, adjusted number of pills die to increased anxiety so patient can take up to 3 times daily.  - ALPRAZolam (XANAX) 0.25 MG tablet; Take 1 tablet (0.25 mg total) by mouth 3 (three) times daily as needed for anxiety or sleep.  Dispense: 90 tablet; Refill: 0  5. Asymptomatic menopausal state Dexa scan ordered - DG Bone Density; Future  6. Dysuria Routine urinalysis done  - UA/M w/rflx Culture, Routine - Microscopic Examination - Urine Culture, Reflex  7. Uncontrolled type 2 diabetes mellitus with hyperglycemia (HCC) Test strips reordered - glucose blood (ACCU-CHEK GUIDE) test strip; USE  STRIP TO CHECK GLUCOSE TWICE DAILY  Dispense: 100 each; Refill: 3  8. Arthritis of right upper arm Tramadol prescribed       General Counseling: Rhaya verbalizes understanding of the findings of todays visit and agrees with plan of treatment. I have discussed any further diagnostic evaluation that may be needed or ordered today. We also reviewed her medications today. she has been encouraged to call the office with any questions or concerns that should arise related to todays visit.    Orders Placed This Encounter  Procedures   Microscopic Examination   Urine Culture, Reflex   DG Bone Density   UA/M w/rflx  Culture, Routine    Meds ordered this encounter  Medications   glucose blood (ACCU-CHEK GUIDE) test strip    Sig: USE  STRIP TO CHECK GLUCOSE TWICE DAILY    Dispense:  100 each    Refill:  3   DISCONTD: traMADol (ULTRAM) 50 MG tablet    Sig: Take 1 tablet (50 mg total) by mouth every 6 (six) hours as needed for up to 5 days for moderate pain or severe pain.    Dispense:  20 tablet    Refill:  0   ALPRAZolam (XANAX) 0.25 MG tablet    Sig: Take 1 tablet (0.25 mg total) by mouth 3 (three) times daily as needed for anxiety or sleep.    Dispense:  90 tablet    Refill:  0     Return in  about 6 weeks (around 09/12/2021) for F/U, Recheck A1C, Mariella Blackwelder PCP.   Total time spent:30 Minutes Time spent includes review of chart, medications, test results, and follow up plan with the patient.   [place] Controlled Substance Database was reviewed by me.  This patient was seen by Jonetta Osgood, FNP-C in collaboration with Dr. Clayborn Bigness as a part of collaborative care agreement.  Elsy Chiang R. Valetta Fuller, MSN, FNP-C Internal medicine

## 2021-07-30 NOTE — Telephone Encounter (Signed)
Med sent to pharmacy.

## 2021-08-02 ENCOUNTER — Encounter: Payer: Self-pay | Admitting: Hematology and Oncology

## 2021-08-04 ENCOUNTER — Telehealth: Payer: Self-pay

## 2021-08-04 DIAGNOSIS — C3411 Malignant neoplasm of upper lobe, right bronchus or lung: Secondary | ICD-10-CM

## 2021-08-04 LAB — MICROSCOPIC EXAMINATION
Bacteria, UA: NONE SEEN
Casts: NONE SEEN /lpf
RBC, Urine: NONE SEEN /hpf (ref 0–2)

## 2021-08-04 LAB — UA/M W/RFLX CULTURE, ROUTINE
Bilirubin, UA: NEGATIVE
Glucose, UA: NEGATIVE
Ketones, UA: NEGATIVE
Nitrite, UA: NEGATIVE
Protein,UA: NEGATIVE
RBC, UA: NEGATIVE
Specific Gravity, UA: 1.018 (ref 1.005–1.030)
Urobilinogen, Ur: 0.2 mg/dL (ref 0.2–1.0)
pH, UA: 6.5 (ref 5.0–7.5)

## 2021-08-04 LAB — URINE CULTURE, REFLEX

## 2021-08-04 NOTE — Telephone Encounter (Signed)
Unable to reach pt x2. Detailed mssg with follow up plan left.   Please schedule PET, next available, then MD 2-3 days after PET and inform pt of appts.

## 2021-08-04 NOTE — Telephone Encounter (Signed)
Unable to reach pt by phone to notify her of follow up plan. Will try again .

## 2021-08-04 NOTE — Telephone Encounter (Signed)
-----   Message from Earlie Server, MD sent at 08/02/2021  3:59 PM EST ----- Please let her know that the CT scan done during her recent admission showed slight increase of lung nodule, recommend PET restaging Histroy of lung cancer follow up.  Adjust her follow up to be 2-3 days after PET. MD only thanks.

## 2021-08-05 ENCOUNTER — Encounter: Payer: Self-pay | Admitting: Hematology and Oncology

## 2021-08-07 ENCOUNTER — Encounter: Payer: Self-pay | Admitting: Internal Medicine

## 2021-08-08 ENCOUNTER — Telehealth: Payer: Self-pay

## 2021-08-08 ENCOUNTER — Other Ambulatory Visit: Payer: Self-pay

## 2021-08-08 MED ORDER — METOPROLOL TARTRATE 25 MG PO TABS: 12.5000 mg | ORAL_TABLET | Freq: Two times a day (BID) | ORAL | 0 refills | Status: DC

## 2021-08-08 MED ORDER — POTASSIUM CHLORIDE ER 10 MEQ PO TBCR: EXTENDED_RELEASE_TABLET | ORAL | 3 refills | Status: DC

## 2021-08-09 ENCOUNTER — Other Ambulatory Visit: Payer: Self-pay | Admitting: Nurse Practitioner

## 2021-08-09 MED ORDER — TRAMADOL HCL 50 MG PO TABS: 50.0000 mg | ORAL_TABLET | Freq: Four times a day (QID) | ORAL | 0 refills | Status: DC | PRN

## 2021-08-13 ENCOUNTER — Other Ambulatory Visit: Payer: Self-pay | Admitting: Internal Medicine

## 2021-08-13 DIAGNOSIS — J449 Chronic obstructive pulmonary disease, unspecified: Secondary | ICD-10-CM

## 2021-08-13 NOTE — Telephone Encounter (Signed)
Is it done?

## 2021-08-14 ENCOUNTER — Ambulatory Visit
Admission: RE | Admit: 2021-08-14 | Discharge: 2021-08-14 | Disposition: A | Discharge status: Home / Self Care | Payer: Medicare Other | Source: Ambulatory Visit | Attending: Oncology | Admitting: Oncology

## 2021-08-14 ENCOUNTER — Other Ambulatory Visit: Payer: Self-pay

## 2021-08-14 ENCOUNTER — Other Ambulatory Visit: Payer: Self-pay | Admitting: Nurse Practitioner

## 2021-08-14 DIAGNOSIS — C3411 Malignant neoplasm of upper lobe, right bronchus or lung: Secondary | ICD-10-CM | POA: Diagnosis present

## 2021-08-14 LAB — GLUCOSE, CAPILLARY: Glucose-Capillary: 111 mg/dL — ABNORMAL HIGH (ref 70–99)

## 2021-08-14 MED ORDER — FLUDEOXYGLUCOSE F - 18 (FDG) INJECTION
6.2000 | Freq: Once | INTRAVENOUS | Status: AC | PRN
  Administered 2021-08-14: 13:00:00 6.54 via INTRAVENOUS

## 2021-08-14 MED ORDER — ALPRAZOLAM 0.25 MG PO TABS: 0.2500 mg | ORAL_TABLET | Freq: Three times a day (TID) | ORAL | 0 refills | Status: DC | PRN

## 2021-08-14 NOTE — Telephone Encounter (Signed)
error 

## 2021-08-15 ENCOUNTER — Other Ambulatory Visit: Payer: Self-pay | Admitting: Nurse Practitioner

## 2021-08-15 ENCOUNTER — Other Ambulatory Visit: Payer: Self-pay

## 2021-08-15 DIAGNOSIS — Z1231 Encounter for screening mammogram for malignant neoplasm of breast: Secondary | ICD-10-CM

## 2021-08-15 MED ORDER — METOPROLOL TARTRATE 25 MG PO TABS: 12.5000 mg | ORAL_TABLET | Freq: Two times a day (BID) | ORAL | 1 refills | Status: DC

## 2021-08-17 ENCOUNTER — Encounter: Payer: Self-pay | Admitting: Nurse Practitioner

## 2021-08-18 DIAGNOSIS — J449 Chronic obstructive pulmonary disease, unspecified: Secondary | ICD-10-CM | POA: Diagnosis not present

## 2021-08-19 ENCOUNTER — Encounter: Payer: Self-pay | Admitting: Oncology

## 2021-08-19 ENCOUNTER — Other Ambulatory Visit: Payer: Self-pay

## 2021-08-19 ENCOUNTER — Inpatient Hospital Stay: Payer: Medicare Other | Attending: Oncology | Admitting: Oncology

## 2021-08-19 VITALS — BP 165/93 | HR 106 | Temp 96.7°F | Wt 122.3 lb

## 2021-08-19 DIAGNOSIS — J9611 Chronic respiratory failure with hypoxia: Secondary | ICD-10-CM | POA: Insufficient documentation

## 2021-08-19 DIAGNOSIS — Z801 Family history of malignant neoplasm of trachea, bronchus and lung: Secondary | ICD-10-CM | POA: Insufficient documentation

## 2021-08-19 DIAGNOSIS — E119 Type 2 diabetes mellitus without complications: Secondary | ICD-10-CM | POA: Diagnosis not present

## 2021-08-19 DIAGNOSIS — C3481 Malignant neoplasm of overlapping sites of right bronchus and lung: Secondary | ICD-10-CM

## 2021-08-19 DIAGNOSIS — Z87891 Personal history of nicotine dependence: Secondary | ICD-10-CM | POA: Insufficient documentation

## 2021-08-19 DIAGNOSIS — I10 Essential (primary) hypertension: Secondary | ICD-10-CM

## 2021-08-19 DIAGNOSIS — I110 Hypertensive heart disease with heart failure: Secondary | ICD-10-CM | POA: Insufficient documentation

## 2021-08-19 DIAGNOSIS — D509 Iron deficiency anemia, unspecified: Secondary | ICD-10-CM | POA: Diagnosis not present

## 2021-08-19 DIAGNOSIS — I509 Heart failure, unspecified: Secondary | ICD-10-CM | POA: Insufficient documentation

## 2021-08-19 DIAGNOSIS — C3411 Malignant neoplasm of upper lobe, right bronchus or lung: Secondary | ICD-10-CM | POA: Diagnosis not present

## 2021-08-19 MED ORDER — ESCITALOPRAM OXALATE 10 MG PO TABS: 10.0000 mg | ORAL_TABLET | Freq: Every day | ORAL | 2 refills | Status: DC

## 2021-08-19 NOTE — Progress Notes (Signed)
Hematology/Oncology Progress note [phone] [phone]      Clinic Day:  08/19/2021  Referring physician: [name], MD  Chief Complaint: Angela Jensen is a 66 y.o. female presents follow-up for  history of stage I lung cancer s/p radiation and iron deficiency anemia    PERTINENT ONCOLOGY HISTORY Patient previously followed up by Dr.Corcoran, patient switched care to me on 03/20/21 Extensive medical record review was performed by me  # Stage I RUL adenocarcinoma  s/p IMRT in 02/2012.   She was not a good surgery candidate secondary to her lung function (FEV1 28%).  CT guided biopsy of the superior and inferior RUL nodules on 12/30/2011 revealed well differentiated adenocarcinoma of lung primary.  EGFR testing revealed no mutation.  She received IMRT 6000 cGy to both nodules from 01/26/2012 - 03/10/2012.   In 04/2015, there was an attempt at radiofrequency ablation of 1 enlarging RUL nodule.  At the time of the procedure, the nodule had shrunk to 5 mm from 9 mm.     # Stage I RLL adenocarcinoma 2019   CT guided RLL biopsy on 02/09/2018 revealed adenocarcinoma of lung origin, lepidic and papillary patterns.   She received 6000 cGy in 5 fractions to RLL nodule from 03/22/2018 - 04/05/2018.   Chest CT on 03/04/2020 revealed enlarging and new pulmonary nodules suspicious for metastatic disease or metachronous primary.  PET scan on 03/19/2020 revealed that the right lower lobe pulmonary nodules did not demonstrate any hypermetabolism to suggest neoplasm. However, these were somewhat worrisome; recommendation was close CT surveillance. There was no mediastinal or hilar lymphadenopathy.  Chest CT  on 09/11/2020 revealed interval treatment of the right lower lobe nodules. These nodules were partly obscured by adjacent parenchymal opacity, although appear decreased in size.  CT guided RLL biopsy on 04/24/2020 revealed adenocarcinoma of the lung with papillary and solid  patterns. 05/28/2020 - 06/11/2020  SBRT to the right lung. She received 60 Gy in 5 fractions.  She has an elevated CEA of unclear etiology.  CEA was 8.1 on 01/31/2016, 9.2 on 03/03/2016, 7.9 on 10/12/2017, and 7.9 on 01/19/2019.   Colonoscopy on 06/25/2015 revealed a 4 mm polyp in the sigmoid colon.  Pathology revealed a tubular adenoma without evidence of dysplasia or malignancy.   Pelvic MRI on 04/30/2016 revealed a 3.4 x 2.5 cm ovoid mass in the LEFT adnexa with signal characteristics identical to the intramural leiomyomas and consistent with an exophytic leiomyoma.  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  Endometrium was normal with a normal junctional zone.  Ovaries were not identified.  # iron deficiency anemia.  She required 2 units of PRBCs on 04/04/2020.  Colonoscopy on 06/25/2015 revealed one 4 mm polyp (tubular adenoma) in the sigmoid colon. There were non-bleeding internal hemorrhoids.  Colonoscopy on 07/26/2020 revealed diverticulosis in the entire examined colon. There were non-bleeding internal hemorrhoids. EGD on 07/26/2020 revealed erosive gastropathy with no bleeding and no stigmata of recent bleeding.    INTERVAL HISTORY Angela Jensen is a 66 y.o. female who has above history reviewed by me today presents for follow up visit for iron deficiency anemia, history of lung cancer Patient continues to have multiple hospitalizations. 07/11/2021, patient was hospitalized and a CT angio chest PE with contrast was ordered and there was slight increase in size".  3 over several months of larger than 1 cm nodular-like densities along the right minor and major fissure in the region of prior radiation therapy.  Recurrent malignancy cannot be  excluded.  12/29 2022, a PET scan was obtained for further evaluation.  PET scan showed stable scarring type changes involving the right lung.  No hypermetabolic pulmonary lesions to suggest recurrent or metastatic tumor. To the patient reports feeling  well.  Chronic shortness of breath no changes.  Past Medical History:  Diagnosis Date   Anemia    Arthritis    Asthma    Atherosclerosis of native arteries of extremity with intermittent claudication ([place]) 05/26/2016   Cancer ([place]) 2012   Right Lung CA   COPD (chronic obstructive pulmonary disease) ([place])    Depression    Diabetes mellitus without complication ([place])    Patient takes Janumet   Essential hypertension 05/26/2016   Heart failure ([place]) 2022   Hypercholesteremia    Oxygen dependent    2L at nite    PAD (peripheral artery disease) ([place]) 06/22/2016   Peripheral vascular disease ([facility])    Personal history of radiation therapy    Shortness of breath dyspnea    with exertion    Sialolithiasis    Sleep apnea    Wears dentures    full upper and lower    Past Surgical History:  Procedure Laterality Date   CESAREAN SECTION     x3   COLONOSCOPY WITH PROPOFOL N/A 06/25/2015   Procedure: COLONOSCOPY WITH PROPOFOL;  Surgeon: Lucilla Lame, MD;  Location: [facility];  Service: Endoscopy;  Laterality: N/A;   COLONOSCOPY WITH PROPOFOL N/A 07/26/2020   Procedure: COLONOSCOPY WITH PROPOFOL;  Surgeon: Lucilla Lame, MD;  Location: [facility];  Service: Endoscopy;  Laterality: N/A;   CYST REMOVAL LEG     and on shoulder    ESOPHAGOGASTRODUODENOSCOPY (EGD) WITH PROPOFOL N/A 07/26/2020   Procedure: ESOPHAGOGASTRODUODENOSCOPY (EGD) WITH PROPOFOL;  Surgeon: Lucilla Lame, MD;  Location: [facility];  Service: Endoscopy;  Laterality: N/A;  Diabetic - oral meds   LOWER EXTREMITY ANGIOGRAPHY Left 09/29/2018   Procedure: LOWER EXTREMITY ANGIOGRAPHY;  Surgeon: Algernon Huxley, MD;  Location: [facility];  Service: Cardiovascular;  Laterality: Left;   LUNG BIOPSY  12/30/2011   has lung "spots"   PACEMAKER IMPLANT  07/14/2021   PACEMAKER LEADLESS INSERTION N/A 07/14/2021   Procedure: PACEMAKER LEADLESS INSERTION;  Surgeon: Isaias Cowman, MD;  Location: [facility];  Service: Cardiovascular;  Laterality: N/A;   PERIPHERAL VASCULAR CATHETERIZATION Left 06/01/2016   Procedure: Lower Extremity Angiography;  Surgeon: Algernon Huxley, MD;  Location: [facility];  Service: Cardiovascular;  Laterality: Left;   PERIPHERAL VASCULAR CATHETERIZATION N/A 06/01/2016   Procedure: Abdominal Aortogram w/Lower Extremity;  Surgeon: Algernon Huxley, MD;  Location: [facility];  Service: Cardiovascular;  Laterality: N/A;   PERIPHERAL VASCULAR CATHETERIZATION  06/01/2016   Procedure: Lower Extremity Intervention;  Surgeon: Algernon Huxley, MD;  Location: [facility];  Service: Cardiovascular;;   PERIPHERAL VASCULAR CATHETERIZATION Right 06/08/2016   Procedure: Lower Extremity Angiography;  Surgeon: Algernon Huxley, MD;  Location: [facility];  Service: Cardiovascular;  Laterality: Right;   PERIPHERAL VASCULAR CATHETERIZATION  06/08/2016   Procedure: Lower Extremity Intervention;  Surgeon: Algernon Huxley, MD;  Location: [facility];  Service: Cardiovascular;;   SUBMANDIBULAR GLAND EXCISION Left 12/06/2020   Procedure: EXCISION SUBMANDIBULAR GLAND;  Surgeon: Beverly Gust, MD;  Location: [facility];  Service: ENT;  Laterality: Left;  needs to be first case Diabetic - diet controlled   TEMPORARY PACEMAKER N/A 07/11/2021   Procedure:  TEMPORARY PACEMAKER;  Surgeon: Isaias Cowman, MD;  Location: [facility];  Service: Cardiovascular;  Laterality: N/A;    Family History  Problem Relation Age of Onset   Lung cancer Father    Diabetes Mother    Hypercholesterolemia Mother    Diabetes Sister    Diabetes Maternal Grandmother    Diabetes Paternal Grandmother    Diabetes Sister    Heart attack Brother    Coronary artery disease Brother    Vascular Disease Brother    Hypertension Sister    Heart attack Brother     Social History:  reports that she quit smoking about 11 years ago. Her smoking use included  cigarettes. She has a 37.00 pack-year smoking history. She has quit using smokeless tobacco.  Her smokeless tobacco use included snuff. She reports that she does not currently use alcohol after a past usage of about 5.0 standard drinks per week. She reports that she does not currently use drugs after having used the following drugs: Marijuana, "Crack" cocaine, and Cocaine. She started smoking at 17 until she was "65 something". Her boyfriend is [name]: [phone]. She lives in [place]. The patient is alone today.  Allergies:  Allergies  Allergen Reactions   Trelegy Ellipta [Fluticasone-Umeclidin-Vilant]     Current Medications: Current Outpatient Medications  Medication Sig Dispense Refill   Accu-Chek Softclix Lancets lancets Use as instructed for twice a daily Dx E11.65 100 each 1   albuterol (PROVENTIL) (2.5 MG/3ML) 0.083% nebulizer solution USE 1 VIAL IN NEBULIZER EVERY 6 HOURS AS NEEDED FOR WHEEZING 1080 mL 1   albuterol (VENTOLIN HFA) 108 (90 Base) MCG/ACT inhaler INHALE 2 PUFFS BY MOUTH EVERY 6 HOURS AS NEEDED FOR WHEEZING OR SHORTNESS OF BREATH 9 g 0   ALPRAZolam (XANAX) 0.25 MG tablet Take 1 tablet (0.25 mg total) by mouth 3 (three) times daily as needed for anxiety or sleep. 90 tablet 0   Blood Glucose Monitoring Suppl (ACCU-CHEK GUIDE) w/Device KIT Use as directed Dx e11.65 1 kit 0   clopidogrel (PLAVIX) 75 MG tablet Take 1 tablet by mouth once daily 90 tablet 0   escitalopram (LEXAPRO) 10 MG tablet Take 1 tablet (10 mg total) by mouth daily. 30 tablet 2   ferrous sulfate 325 (65 FE) MG tablet Take 325 mg by mouth 2 (two) times daily with a meal.     fluticasone (FLONASE) 50 MCG/ACT nasal spray Place 2 sprays into both nostrils daily. 9.9 mL 2   gabapentin (NEURONTIN) 100 MG capsule Take 1 capsule (100 mg total) by mouth 2 (two) times daily. 90 capsule 3   glucose blood (ACCU-CHEK GUIDE) test strip USE  STRIP TO CHECK GLUCOSE TWICE DAILY 100 each 3   insulin detemir  (LEVEMIR FLEXTOUCH) 100 UNIT/ML FlexPen Inject 8- 12 units with supper qd// with needles 6 mL 11   lisinopril (ZESTRIL) 10 MG tablet Take 1 tablet (10 mg total) by mouth daily. 30 tablet 2   metFORMIN (GLUCOPHAGE) 500 MG tablet Take 1 tablet (500 mg total) by mouth daily with breakfast. 90 tablet 3   metoprolol tartrate (LOPRESSOR) 25 MG tablet Take 0.5 tablets (12.5 mg total) by mouth 2 (two) times daily. 30 tablet 1   mometasone-formoterol (DULERA) 200-5 MCG/ACT AERO Inhale 2 puffs into the lungs 2 (two) times daily. 13 g 5   Omega-3 Fatty Acids (FISH OIL) 1000 MG CAPS Take 1 capsule by mouth daily.     OXYGEN Inhale into the lungs.     potassium  chloride (KLOR-CON) 10 MEQ tablet TAKE 1 TABLET BY MOUTH EVERY OTHER DAY 45 tablet 3   tiotropium (SPIRIVA HANDIHALER) 18 MCG inhalation capsule INHALE THE CONTENTS OF ONE CAPSULE BY MOUTH ONCE DAILY. DO NOT SWALLOW CAPSULE. 90 capsule 0   traMADol (ULTRAM) 50 MG tablet Take 1 tablet (50 mg total) by mouth every 6 (six) hours as needed for moderate pain or severe pain. 30 tablet 0   famotidine (PEPCID) 20 MG tablet Take 1 tablet (20 mg total) by mouth daily for 14 days. 14 tablet 0   No current facility-administered medications for this visit.    Review of Systems  Constitutional:  Negative for chills, diaphoresis, fever, malaise/fatigue and weight loss.  HENT:  Negative for congestion, ear discharge, ear pain, hearing loss, nosebleeds, sinus pain, sore throat and tinnitus.   Eyes: Negative.  Negative for blurred vision.  Respiratory:  Negative for cough, hemoptysis, sputum production and shortness of breath.        Patient is on nasal cannula oxygen  Cardiovascular:  Negative for chest pain, palpitations and leg swelling.  Gastrointestinal: Negative.  Negative for abdominal pain, blood in stool, constipation, diarrhea, heartburn, melena, nausea and vomiting.  Genitourinary: Negative.  Negative for dysuria, frequency, hematuria and urgency.   Musculoskeletal:  Negative for back pain, joint pain, myalgias and neck pain.  Skin: Negative.  Negative for itching and rash.  Neurological: Negative.  Negative for dizziness, tingling, sensory change, speech change, focal weakness, weakness and headaches.  Endo/Heme/Allergies: Negative.  Does not bruise/bleed easily.       Diabetes, under good control  Psychiatric/Behavioral: Negative.  Negative for depression and memory loss. The patient is not nervous/anxious and does not have insomnia.   All other systems reviewed and are negative.  Performance status (ECOG): 0-1  Vitals:  Blood pressure (!) 165/93, pulse (!) 106, temperature (!) 96.7 F (35.9 C), temperature source Tympanic, weight 122 lb 4.8 oz (55.5 kg).  Physical Exam Vitals and nursing note reviewed.  Constitutional:      General: She is not in acute distress.    Appearance: She is not diaphoretic.  HENT:     Head: Atraumatic.     Comments: Dark hair.    Mouth/Throat:     Mouth: Mucous membranes are moist.     Pharynx: Oropharynx is clear.  Eyes:     General: No scleral icterus.    Conjunctiva/sclera: Conjunctivae normal.  Cardiovascular:     Rate and Rhythm: Normal rate and regular rhythm.     Pulses: Normal pulses.     Heart sounds: Normal heart sounds.  Pulmonary:     Effort: Pulmonary effort is normal. No respiratory distress.     Breath sounds: No wheezing or rales.     Comments: Decreased breath sound bilaterally Patient is on nasal cannula oxygen. Abdominal:     General: There is no distension.     Palpations: Abdomen is soft. There is no hepatomegaly or splenomegaly.     Tenderness: There is no abdominal tenderness.  Musculoskeletal:        General: No swelling.  Lymphadenopathy:     Head:     Right side of head: No preauricular, posterior auricular or occipital adenopathy.     Left side of head: No preauricular, posterior auricular or occipital adenopathy.     Cervical: No cervical adenopathy.      Upper Body:     Right upper body: No supraclavicular or axillary adenopathy.     Left upper  body: No supraclavicular or axillary adenopathy.     Lower Body: No right inguinal adenopathy. No left inguinal adenopathy.  Skin:    General: Skin is warm and dry.  Neurological:     Mental Status: She is alert and oriented to person, place, and time.  Psychiatric:        Mood and Affect: Mood normal.   RADIOGRAPHIC STUDIES: I have personally reviewed the radiological images as listed and agreed with the findings in the report. DG Chest 1 View  Result Date: 07/11/2021 CLINICAL DATA:  Pacemaker.  Intubated. EXAM: PORTABLE CHEST 1 VIEW COMPARISON:  Chest XR, 07/11/2021.  CT chest , 07/11/2021. FINDINGS: Support lines: ETT with tip within the mid-to-distal thoracic trachea, unchanged. RIGHT IJ CVC with catheter tip within the RIGHT atrium. NG tube with tip excluded from. Overlying pacer leads. Cardiomediastinal silhouette is unchanged. Well inflated lungs with perihilar and basilar coarse interstitial opacities. No pleural effusion pneumothorax. No osseous abnormality. IMPRESSION: 1. Lines and tubes as above. 2. Unchanged pulmonary findings. Electronically Signed   By: Michaelle Birks M.D.   On: 07/11/2021 08:05   DG Chest 2 View  Result Date: 06/26/2021 CLINICAL DATA:  Shortness of breath EXAM: CHEST - 2 VIEW COMPARISON:  06/11/2021 FINDINGS: Background emphysema. Persistent but improved opacities at the right greater than left lung bases. No significant pleural effusion. No pneumothorax. Stable cardiomediastinal contours with normal heart size. IMPRESSION: Persistent but improved pneumonia at the right greater than left lung bases. Electronically Signed   By: Macy Mis M.D.   On: 06/26/2021 13:24   DG Abd 1 View  Result Date: 07/11/2021 CLINICAL DATA:  OG tube placement EXAM: ABDOMEN - 1 VIEW COMPARISON:  None. FINDINGS: Enteric tube passes into the body of the stomach. Included bowel gas pattern  is unremarkable. IMPRESSION: Enteric tube within stomach. Electronically Signed   By: Macy Mis M.D.   On: 07/11/2021 08:22   CT Angio Chest PE W and/or Wo Contrast  Result Date: 07/11/2021 CLINICAL DATA:  reports of shortness of breath, pt was discharged from hospital about 3 hours prior to arrival, pt states when she left she was able to eat, but then after while became short of breath. History of lung cancer. EXAM: CT ANGIOGRAPHY CHEST WITH CONTRAST TECHNIQUE: Multidetector CT imaging of the chest was performed using the standard protocol during bolus administration of intravenous contrast. Multiplanar CT image reconstructions and MIPs were obtained to evaluate the vascular anatomy. CONTRAST:  75m OMNIPAQUE IOHEXOL 350 MG/ML SOLN COMPARISON:  CT angio chest 06/26/2021, CT angiography chest 05/01/2021, CT angiography chest 03/31/2021, chest x-ray 08/01/2011, CT chest 08/01/2015, CT chest 02/09/2021 FINDINGS: Cardiovascular: Fair opacification of the pulmonary arteries to the segmental level. No evidence of pulmonary embolism. Normal heart size. No significant pericardial effusion. The thoracic aorta is normal in caliber. Mild atherosclerotic plaque of the thoracic aorta. No coronary artery calcifications. Mediastinum/Nodes: No enlarged mediastinal, hilar, or axillary lymph nodes. Thyroid gland, trachea, and esophagus demonstrate no significant findings. Lungs/Pleura: Limited evaluation due to respiratory motion artifact. At least moderate centrilobular emphysematous changes with similar-appearing reticulations along the peripheral right lower lobe. Linear atelectasis versus scarring again noted within the right lower lobe. Difficult to measure but likely interval increase in size and conspicuity over several months of a 0.8 x 1.3 cm nodular-like density along the subpleural right apex and a couple of nodular-like densities along the right major and minor fissures: 1.5 x 1.6 cm (7:43) and 1.7 x 1.1 cm  (7:49, 8:17-22).  No focal consolidation. No pleural effusion.  No pneumothorax. Upper Abdomen: No acute abnormality. Musculoskeletal: No chest wall abnormality. No suspicious lytic or blastic osseous lesions. No acute displaced fracture. Review of the MIP images confirms the above findings. IMPRESSION: 1. No pulmonary embolus. 2. Slightly increased in size and conspicuity over several months of larger than 1 cm nodular-like densities along the right minor and major fissures in the region of prior radiation therapy. Similarly slight interval increase in size of the right apex subpleural nodule. Recurrent malignancy cannot be excluded. Additional imaging evaluation or consultation with Pulmonology or Thoracic Surgery recommended. 3. Aortic Atherosclerosis (ICD10-I70.0) and Emphysema (ICD10-J43.9). Electronically Signed   By: Iven Finn M.D.   On: 07/11/2021 01:53   CT Angio Chest Pulmonary Embolism (PE) W or WO Contrast  Result Date: 06/26/2021 CLINICAL DATA:  Difficulty breathing, COPD EXAM: CT ANGIOGRAPHY CHEST WITH CONTRAST TECHNIQUE: Multidetector CT imaging of the chest was performed using the standard protocol during bolus administration of intravenous contrast. Multiplanar CT image reconstructions and MIPs were obtained to evaluate the vascular anatomy. CONTRAST:  33m OMNIPAQUE IOHEXOL 350 MG/ML SOLN COMPARISON:  05/01/2021 FINDINGS: Cardiovascular: There is homogeneous enhancement in thoracic aorta. There are no intraluminal filling defects in the central pulmonary artery branches. There is slightly inhomogeneous attenuation in the small peripheral branches in the both lower lobes. In the image 59 of series 4, there is questionable exophytic low-density in the anterior margin of 1 of the segmental branches in the left lower lobe which could not be distinctly seen in the adjacent images suggesting possible artifact. Evaluation is limited by motion artifacts in both lungs and infiltrates/scarring in  the right lung. Mediastinum/Nodes: No new significant lymphadenopathy seen Lungs/Pleura: Marked bronchospasm seen in the previous study is not evident in the current study. Centrilobular and panlobular emphysema is seen in both lungs. There is small patchy infiltrate in lateral aspect of right apex with no significant change. There is focal alveolar density in the posterior segment of right upper lobe with no significant change. There are patchy infiltrates in the right lower lobe with no significant interval change. Increased interstitial markings are seen in the right lung with no significant change suggesting scarring. There are no new focal pulmonary infiltrates. There is no significant pleural effusion or pneumothorax. Upper Abdomen: There is fatty infiltration in the liver Musculoskeletal: Unremarkable Review of the MIP images confirms the above findings. IMPRESSION: There is no evidence central pulmonary artery embolism. Evaluation of small peripheral pulmonary artery branches is limited by motion artifacts and infiltrates, especially in the lower lobes. There is no evidence of right ventricular strain. COPD. There are patchy infiltrates and fibrotic changes in the right lung with no significant interval change. There is no pleural effusion or pneumothorax. Electronically Signed   By: PElmer PickerM.D.   On: 06/26/2021 16:51   CARDIAC CATHETERIZATION  Result Date: 07/11/2021 Successful placement of transvenous temporary pacemaker via right internal jugular vein   EP PPM/ICD IMPLANT  Result Date: 07/14/2021 Successful Micra AV leadless pacemaker implantation   NM PET Image Restag (PS) Skull Base To Thigh  Result Date: 08/15/2021 CLINICAL DATA:  Subsequent treatment strategy for lung cancer. Enlarging pulmonary nodules. EXAM: NUCLEAR MEDICINE PET SKULL BASE TO THIGH TECHNIQUE: 6.54 mCi F-18 FDG was injected intravenously. Full-ring PET imaging was performed from the skull base to thigh  after the radiotracer. CT data was obtained and used for attenuation correction and anatomic localization. Fasting blood glucose: 111 mg/dl COMPARISON:  Chest CT  07/11/2021.  Prior PET-CT 03/19/2020 FINDINGS: Mediastinal blood pool activity: SUV max 1.90 Liver activity: SUV max NA NECK: No hypermetabolic lymph nodes in the neck. Incidental CT findings: none CHEST: Stable bandlike scarring changes in the right upper lobe. Mild hypermetabolism in this area with SUV max 1.88 below that of mediastinal background. This appears stable when compared to the prior PET-CT. No findings suspicious for recurrent tumor. Stable appearing nodularity in the right lower lobe without FDG uptake. Findings consistent with scarring change. No PET-CT findings suspicious for new or recurrent tumor pulmonary metastatic disease. No enlarged or hypermetabolic mediastinal or hilar lymph nodes. Incidental CT findings: Significant artifact related to a recent lead less pacemaker insertion. Stable aortic and coronary artery calcifications. Stable surgical changes involving the right chest wall from a previous thoracotomy. ABDOMEN/PELVIS: No abnormal hypermetabolic activity within the liver, pancreas, adrenal glands, or spleen. No hypermetabolic lymph nodes in the abdomen or pelvis. Incidental CT findings: Stable atherosclerotic calcifications involving the aorta and iliac arteries. Stable cystic lesion in the subcutaneous fat posteriorly just above the iliac crests, likely benign sebaceous cyst. SKELETON: No focal hypermetabolic activity to suggest skeletal metastasis. Incidental CT findings: none IMPRESSION: 1. Stable scarring type changes involving the right lung. No hypermetabolic pulmonary lesions to suggest recurrent or metastatic tumor. 2. No findings for metastatic disease involving the abdomen/pelvis or bony structures. Electronically Signed   By: Marijo Sanes M.D.   On: 08/15/2021 13:15   DG Chest Port 1 View  Result Date:  07/12/2021 CLINICAL DATA:  Shortness of breath. EXAM: PORTABLE CHEST 1 VIEW COMPARISON:  Chest radiograph dated 07/11/2021. FINDINGS: Interval removal of endotracheal and enteric tubes. Right IJ pacer lead again noted. Diffuse chronic antral coarsening. No interval change in bilateral mid to lower lung field interstitial densities. No new consolidation, pleural effusion or pneumothorax. Stable cardiomediastinal silhouette. No acute osseous pathology. IMPRESSION: No interval change in the bilateral mid to lower lung field interstitial densities. Electronically Signed   By: Anner Crete M.D.   On: 07/12/2021 00:35   DG Chest Port 1 View  Result Date: 07/11/2021 CLINICAL DATA:  Intubation. EXAM: PORTABLE CHEST 1 VIEW COMPARISON:  Chest radiograph dated 07/10/2021. FINDINGS: Endotracheal tube with tip approximately 3 cm above the carina. No interval change in bilateral pulmonary opacities compared to the prior radiograph. No large pleural effusion or pneumothorax. Stable cardiomediastinal silhouette. No acute osseous pathology. IMPRESSION: Endotracheal tube with tip above the carina. Electronically Signed   By: Anner Crete M.D.   On: 07/11/2021 03:52   DG Chest Portable 1 View  Result Date: 07/10/2021 CLINICAL DATA:  Rule out pneumonia. EXAM: PORTABLE CHEST 1 VIEW COMPARISON:  Chest radiograph dated 06/26/2021. Chest CT dated 06/26/2021. FINDINGS: Background of emphysema. Overall similar appearance of bilateral lower lung field airspace densities compared to prior radiograph. No new consolidation. There is no pleural effusion pneumothorax. Stable cardiac silhouette. No acute osseous pathology. IMPRESSION: No significant interval change. Electronically Signed   By: Anner Crete M.D.   On: 07/10/2021 20:29   DG Chest Portable 1 View  Result Date: 06/11/2021 CLINICAL DATA:  Shortness of breath, wheezing. EXAM: PORTABLE CHEST 1 VIEW COMPARISON:  Chest radiograph 05/06/2021; CT chest 05/01/2021  FINDINGS: Upper lobe predominant emphysematous changes with reticulation at the lung bases. Interstitial and patchy airspace opacities in the mid and lower lungs, right greater than left. No pleural effusion or pneumothorax. Scarring at the right apex. Heart is normal in size. No acute osseous abnormality. IMPRESSION: Interstitial and patchy airspace  opacities in the mid and lower lungs, right-greater-than-left. Findings are concerning for multifocal pneumonia in the appropriate clinical context. Emphysema (ICD10-J43.9). Electronically Signed   By: Ileana Roup M.D.   On: 06/11/2021 14:20   VAS [name] ABI WITH/WO TBI  Result Date: 05/30/2021  LOWER EXTREMITY DOPPLER STUDY Patient Name:  Angela Jensen  Date of Exam:   05/27/2021 Medical Rec #: [id number]     Accession #:    [id number] Date of Birth: 07/27/1956     Patient Gender: F Patient Age:   2 years Exam Location:  [facility] Procedure:      VAS [name] ABI WITH/WO TBI Referring Phys: [name] --------------------------------------------------------------------------------  Indications: Peripheral artery disease, and 09/29/2018 left SFA to pop PTA and              stents.  Vascular Interventions: 06/01/16: PTA/stent of left distal SFA/popliteal                         arteries;                         06/08/16: PTA of left SFA. Comparison Study: 11/22/2020 Performing Technologist: Charlane Ferretti RT (R)(VS)  Examination Guidelines: A complete evaluation includes at minimum, Doppler waveform signals and systolic blood pressure reading at the level of bilateral brachial, anterior tibial, and posterior tibial arteries, when vessel segments are accessible. Bilateral testing is considered an integral part of a complete examination. Photoelectric Plethysmograph (PPG) waveforms and toe systolic pressure readings are included as required and additional duplex testing as needed. Limited examinations for reoccurring indications may be performed as noted.  ABI  Findings: +---------+------------------+-----+----------+--------+  Right     Rt Pressure (mmHg) Index Waveform   Comment   +---------+------------------+-----+----------+--------+  Brachial  156                                           +---------+------------------+-----+----------+--------+  ATA       116                      monophasic           +---------+------------------+-----+----------+--------+  PTA       131                0.84  monophasic           +---------+------------------+-----+----------+--------+  Great Toe 112                0.72  Abnormal             +---------+------------------+-----+----------+--------+ +---------+------------------+-----+----------+-------+  Left      Lt Pressure (mmHg) Index Waveform   Comment  +---------+------------------+-----+----------+-------+  Brachial  156                                          +---------+------------------+-----+----------+-------+  ATA       109                      monophasic          +---------+------------------+-----+----------+-------+  PTA       107  0.69  monophasic          +---------+------------------+-----+----------+-------+  Great Toe 69                 0.44  Abnormal            +---------+------------------+-----+----------+-------+ +-------+-----------+-----------+------------+------------+  ABI/TBI Today's ABI Today's TBI Previous ABI Previous TBI  +-------+-----------+-----------+------------+------------+  Right   .84         .72         .90          .81           +-------+-----------+-----------+------------+------------+  Left    .70         .44         .79          .68           +-------+-----------+-----------+------------+------------+ Bilateral ABIs appear essentially unchanged compared to prior study on 11/22/2020. Bilateral TBIs appear essentially unchanged compared to prior study on 11/22/2020.  Summary: Right: Resting right ankle-brachial index indicates mild right lower extremity arterial disease. The  right toe-brachial index is normal. Left: Resting left ankle-brachial index indicates moderate left lower extremity arterial disease. The left toe-brachial index is abnormal. Left TBI sppears slightly decreased as compared to the previous exam on 11/22/2020.  *See table(s) above for measurements and observations.  Electronically signed by [name] MD on 05/30/2021 at 8:51:47 AM.    Final      Assessment and Plan: 1. Malignant neoplasm of upper lobe of right lung ([place])   2. 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 -Recurrence-SBRT completed 06/11/2020. Foundation one NGS showed TMB >10 muts/mb, MS stable, KRAS G12V, MYC amplification,STK11 A213YQ*65, FUBP1 splice sit 784+6N>G Recent PET scan results were reviewed and discussed with patient There is no signs of recurrence.  The slight increase of right lung nodular density likely is due to radiation changes. I recommend patient to repeat CT scan in 6 months.  #Chronic respiratory failure, continue oxygen supplementation.  Chronic respiratory failure, Follow-up in 6 months after repeat CT scan. Earlie Server, MD, PhD

## 2021-08-26 ENCOUNTER — Other Ambulatory Visit: Payer: Self-pay

## 2021-08-26 ENCOUNTER — Ambulatory Visit: Payer: Medicare Other | Admitting: Internal Medicine

## 2021-08-26 ENCOUNTER — Encounter: Payer: Self-pay | Admitting: Internal Medicine

## 2021-08-26 VITALS — BP 144/92 | HR 96 | Temp 98.1°F | Resp 16 | Ht 59.0 in | Wt 124.2 lb

## 2021-08-26 DIAGNOSIS — J9611 Chronic respiratory failure with hypoxia: Secondary | ICD-10-CM | POA: Diagnosis not present

## 2021-08-26 DIAGNOSIS — C3431 Malignant neoplasm of lower lobe, right bronchus or lung: Secondary | ICD-10-CM | POA: Diagnosis not present

## 2021-08-26 DIAGNOSIS — J449 Chronic obstructive pulmonary disease, unspecified: Secondary | ICD-10-CM | POA: Diagnosis not present

## 2021-08-26 NOTE — Progress Notes (Signed)
[facility] [place], [place] [address]  Pulmonary Sleep Medicine   Office Visit Note  Patient Name: Angela Jensen DOB: September 09, 1955 MRN [id number]  Date of Service: 08/26/2021  Complaints/HPI: COPD severe COPD. Patient has had PFT done compared to 2021 she is doing well. Her Fev1 is relatively stable. Patient is on oxygen 2.5L. She was last admitted to the hospital in November had a pacemaker put in.  Since then she has been doing relatively well.  Her breathing is about baseline and has noted the pulmonary function so stable FEV1 at this stage.  ROS  General: (-) fever, (-) chills, (-) night sweats, (-) weakness Skin: (-) rashes, (-) itching,. Eyes: (-) visual changes, (-) redness, (-) itching. Nose and Sinuses: (-) nasal stuffiness or itchiness, (-) postnasal drip, (-) nosebleeds, (-) sinus trouble. Mouth and Throat: (-) sore throat, (-) hoarseness. Neck: (-) swollen glands, (-) enlarged thyroid, (-) neck pain. Respiratory: + cough, (-) bloody sputum, + shortness of breath, - wheezing. Cardiovascular: - ankle swelling, (-) chest pain. Lymphatic: (-) lymph node enlargement. Neurologic: (-) numbness, (-) tingling. Psychiatric: (-) anxiety, (-) depression   Current Medication: Outpatient Encounter Medications as of 08/26/2021  Medication Sig   Accu-Chek Softclix Lancets lancets Use as instructed for twice a daily Dx E11.65   albuterol (PROVENTIL) (2.5 MG/3ML) 0.083% nebulizer solution USE 1 VIAL IN NEBULIZER EVERY 6 HOURS AS NEEDED FOR WHEEZING   albuterol (VENTOLIN HFA) 108 (90 Base) MCG/ACT inhaler INHALE 2 PUFFS BY MOUTH EVERY 6 HOURS AS NEEDED FOR WHEEZING OR SHORTNESS OF BREATH   ALPRAZolam (XANAX) 0.25 MG tablet Take 1 tablet (0.25 mg total) by mouth 3 (three) times daily as needed for anxiety or sleep.   Blood Glucose Monitoring Suppl (ACCU-CHEK GUIDE) w/Device KIT Use as directed Dx e11.65   clopidogrel (PLAVIX) 75 MG tablet Take 1 tablet by mouth  once daily   escitalopram (LEXAPRO) 10 MG tablet Take 1 tablet (10 mg total) by mouth daily.   ferrous sulfate 325 (65 FE) MG tablet Take 325 mg by mouth 2 (two) times daily with a meal.   fluticasone (FLONASE) 50 MCG/ACT nasal spray Place 2 sprays into both nostrils daily.   gabapentin (NEURONTIN) 100 MG capsule Take 1 capsule (100 mg total) by mouth 2 (two) times daily.   glucose blood (ACCU-CHEK GUIDE) test strip USE  STRIP TO CHECK GLUCOSE TWICE DAILY   insulin detemir (LEVEMIR FLEXTOUCH) 100 UNIT/ML FlexPen Inject 8- 12 units with supper qd// with needles   lisinopril (ZESTRIL) 10 MG tablet Take 1 tablet (10 mg total) by mouth daily.   metFORMIN (GLUCOPHAGE) 500 MG tablet Take 1 tablet (500 mg total) by mouth daily with breakfast.   metoprolol tartrate (LOPRESSOR) 25 MG tablet Take 0.5 tablets (12.5 mg total) by mouth 2 (two) times daily.   mometasone-formoterol (DULERA) 200-5 MCG/ACT AERO Inhale 2 puffs into the lungs 2 (two) times daily.   Omega-3 Fatty Acids (FISH OIL) 1000 MG CAPS Take 1 capsule by mouth daily.   OXYGEN Inhale into the lungs.   potassium chloride (KLOR-CON) 10 MEQ tablet TAKE 1 TABLET BY MOUTH EVERY OTHER DAY   tiotropium (SPIRIVA HANDIHALER) 18 MCG inhalation capsule INHALE THE CONTENTS OF ONE CAPSULE BY MOUTH ONCE DAILY. DO NOT SWALLOW CAPSULE.   traMADol (ULTRAM) 50 MG tablet Take 1 tablet (50 mg total) by mouth every 6 (six) hours as needed for moderate pain or severe pain.   famotidine (PEPCID) 20 MG tablet Take 1  tablet (20 mg total) by mouth daily for 14 days.   No facility-administered encounter medications on file as of 08/26/2021.    Surgical History: Past Surgical History:  Procedure Laterality Date   CESAREAN SECTION     x3   COLONOSCOPY WITH PROPOFOL N/A 06/25/2015   Procedure: COLONOSCOPY WITH PROPOFOL;  Surgeon: Lucilla Lame, MD;  Location: [facility];  Service: Endoscopy;  Laterality: N/A;   COLONOSCOPY WITH PROPOFOL N/A 07/26/2020   Procedure:  COLONOSCOPY WITH PROPOFOL;  Surgeon: Lucilla Lame, MD;  Location: [facility];  Service: Endoscopy;  Laterality: N/A;   CYST REMOVAL LEG     and on shoulder    ESOPHAGOGASTRODUODENOSCOPY (EGD) WITH PROPOFOL N/A 07/26/2020   Procedure: ESOPHAGOGASTRODUODENOSCOPY (EGD) WITH PROPOFOL;  Surgeon: Lucilla Lame, MD;  Location: [facility];  Service: Endoscopy;  Laterality: N/A;  Diabetic - oral meds   LOWER EXTREMITY ANGIOGRAPHY Left 09/29/2018   Procedure: LOWER EXTREMITY ANGIOGRAPHY;  Surgeon: Algernon Huxley, MD;  Location: [facility];  Service: Cardiovascular;  Laterality: Left;   LUNG BIOPSY  12/30/2011   has lung "spots"   PACEMAKER IMPLANT  07/14/2021   PACEMAKER LEADLESS INSERTION N/A 07/14/2021   Procedure: PACEMAKER LEADLESS INSERTION;  Surgeon: Isaias Cowman, MD;  Location: [facility];  Service: Cardiovascular;  Laterality: N/A;   PERIPHERAL VASCULAR CATHETERIZATION Left 06/01/2016   Procedure: Lower Extremity Angiography;  Surgeon: Algernon Huxley, MD;  Location: [facility];  Service: Cardiovascular;  Laterality: Left;   PERIPHERAL VASCULAR CATHETERIZATION N/A 06/01/2016   Procedure: Abdominal Aortogram w/Lower Extremity;  Surgeon: Algernon Huxley, MD;  Location: [facility];  Service: Cardiovascular;  Laterality: N/A;   PERIPHERAL VASCULAR CATHETERIZATION  06/01/2016   Procedure: Lower Extremity Intervention;  Surgeon: Algernon Huxley, MD;  Location: [facility];  Service: Cardiovascular;;   PERIPHERAL VASCULAR CATHETERIZATION Right 06/08/2016   Procedure: Lower Extremity Angiography;  Surgeon: Algernon Huxley, MD;  Location: [facility];  Service: Cardiovascular;  Laterality: Right;   PERIPHERAL VASCULAR CATHETERIZATION  06/08/2016   Procedure: Lower Extremity Intervention;  Surgeon: Algernon Huxley, MD;  Location: [facility];  Service: Cardiovascular;;   SUBMANDIBULAR GLAND EXCISION Left 12/06/2020   Procedure: EXCISION  SUBMANDIBULAR GLAND;  Surgeon: Beverly Gust, MD;  Location: [facility];  Service: ENT;  Laterality: Left;  needs to be first case Diabetic - diet controlled   TEMPORARY PACEMAKER N/A 07/11/2021   Procedure: TEMPORARY PACEMAKER;  Surgeon: Isaias Cowman, MD;  Location: [place];  Service: Cardiovascular;  Laterality: N/A;    Medical History: Past Medical History:  Diagnosis Date   Anemia    Arthritis    Asthma    Atherosclerosis of native arteries of extremity with intermittent claudication ([place]) 05/26/2016   Cancer ([place]) 2012   Right Lung CA   COPD (chronic obstructive pulmonary disease) ([place])    Depression    Diabetes mellitus without complication ([place])    Patient takes Janumet   Essential hypertension 05/26/2016   Heart failure ([place]) 2022   Hypercholesteremia    Oxygen dependent    2L at nite    PAD (peripheral artery disease) ([place]) 06/22/2016   Peripheral vascular disease ([facility])    Personal history of radiation therapy    Shortness of breath dyspnea    with exertion    Sialolithiasis    Sleep apnea    Wears dentures    full upper and lower  Family History: Family History  Problem Relation Age of Onset   Lung cancer Father    Diabetes Mother    Hypercholesterolemia Mother    Diabetes Sister    Diabetes Maternal Grandmother    Diabetes Paternal Grandmother    Diabetes Sister    Heart attack Brother    Coronary artery disease Brother    Vascular Disease Brother    Hypertension Sister    Heart attack Brother     Social History: Social History   Socioeconomic History   Marital status: Widowed    Spouse name: Not on file   Number of children: Not on file   Years of education: Not on file   Highest education level: Not on file  Occupational History   Not on file  Tobacco Use   Smoking status: Former    Packs/day: 1.00    Years: 37.00    Pack years: 37.00    Types: Cigarettes    Quit date: 02/06/2010    Years since  quitting: 11.5   Smokeless tobacco: Former    Types: Snuff  Vaping Use   Vaping Use: Never used  Substance and Sexual Activity   Alcohol use: Not Currently    Alcohol/week: 5.0 standard drinks    Types: 5 Cans of beer per week    Comment: /h x of alcohol abuse -stopped 2012- now drinks 5 beer per week   Drug use: Not Currently    Types: Marijuana, "Crack" cocaine, Cocaine    Comment: hx of cocaine use- last use 2015; last use marijuana6/22/19,   Sexual activity: Yes  Other Topics Concern   Not on file  Social History Narrative   Lives with Significant Other x 43 years   Social Determinants of Health   Financial Resource Strain: Not on file  Food Insecurity: Not on file  Transportation Needs: Not on file  Physical Activity: Not on file  Stress: Not on file  Social Connections: Not on file  Intimate Partner Violence: Not on file    Vital Signs: Blood pressure (!) 144/92, pulse 96, temperature 98.1 F (36.7 C), resp. rate 16, height '4\' 11"'  (1.499 m), weight 124 lb 3.2 oz (56.3 kg), SpO2 96 %.  Examination: General Appearance: The patient is well-developed, well-nourished, and in no distress. Skin: Gross inspection of skin unremarkable. Head: normocephalic, no gross deformities. Eyes: no gross deformities noted. ENT: ears appear grossly normal no exudates. Neck: Supple. No thyromegaly. No LAD. Respiratory: clear no rhonchi noted. Cardiovascular: Normal S1 and S2 without murmur or rub. Extremities: No cyanosis. pulses are equal. Neurologic: Alert and oriented. No involuntary movements.  LABS: Recent Results (from the past 2160 hour(s))  Basic metabolic panel     Status: Abnormal   Collection Time: 06/11/21  1:44 PM  Result Value Ref Range   Sodium 140 135 - 145 mmol/L   Potassium 4.4 3.5 - 5.1 mmol/L   Chloride 104 98 - 111 mmol/L   CO2 25 22 - 32 mmol/L   Glucose, Bld 187 (H) 70 - 99 mg/dL    Comment: Glucose reference range applies only to samples taken after  fasting for at least 8 hours.   BUN 16 8 - 23 mg/dL   Creatinine, Ser 0.82 0.44 - 1.00 mg/dL   Calcium 8.6 (L) 8.9 - 10.3 mg/dL   GFR, Estimated >60 >60 mL/min    Comment: (NOTE) Calculated using the CKD-EPI Creatinine Equation (2021)    Anion gap 11 5 - 15  Comment: Performed at [facility], [address] [place], [place] [address]  CBC with Differential     Status: None   Collection Time: 06/11/21  1:44 PM  Result Value Ref Range   WBC 4.3 4.0 - 10.5 K/uL   RBC 4.29 3.87 - 5.11 MIL/uL   Hemoglobin 12.5 12.0 - 15.0 g/dL   HCT 41.2 36.0 - 46.0 %   MCV 96.0 80.0 - 100.0 fL   MCH 29.1 26.0 - 34.0 pg   MCHC 30.3 30.0 - 36.0 g/dL   RDW 13.8 11.5 - 15.5 %   Platelets 268 150 - 400 K/uL   nRBC 0.0 0.0 - 0.2 %   Neutrophils Relative % 41 %   Neutro Abs 1.8 1.7 - 7.7 K/uL   Lymphocytes Relative 41 %   Lymphs Abs 1.8 0.7 - 4.0 K/uL   Monocytes Relative 13 %   Monocytes Absolute 0.6 0.1 - 1.0 K/uL   Eosinophils Relative 4 %   Eosinophils Absolute 0.2 0.0 - 0.5 K/uL   Basophils Relative 1 %   Basophils Absolute 0.0 0.0 - 0.1 K/uL   Immature Granulocytes 0 %   Abs Immature Granulocytes 0.01 0.00 - 0.07 K/uL    Comment: Performed at [facility], [address] [place],  [address]  Resp Panel by RT-PCR (Flu A&B, Covid) Nasopharyngeal Swab     Status: None   Collection Time: 06/11/21  1:44 PM   Specimen: Nasopharyngeal Swab; Nasopharyngeal(NP) swabs in vial transport medium  Result Value Ref Range   SARS Coronavirus 2 by RT PCR NEGATIVE NEGATIVE    Comment: (NOTE) SARS-CoV-2 target nucleic acids are NOT DETECTED.  The SARS-CoV-2 RNA is generally detectable in upper respiratory specimens during the acute phase of infection. The lowest concentration of SARS-CoV-2 viral copies this assay can detect is 138 copies/mL. A negative result does not preclude SARS-Cov-2 infection and should not be used as the sole basis for treatment or other patient  management decisions. A negative result may occur with  improper specimen collection/handling, submission of specimen other than nasopharyngeal swab, presence of viral mutation(s) within the areas targeted by this assay, and inadequate number of viral copies(<138 copies/mL). A negative result must be combined with clinical observations, patient history, and epidemiological information. The expected result is Negative.  Fact Sheet for Patients:  EntrepreneurPulse.com.au  Fact Sheet for Healthcare Providers:  IncredibleEmployment.be  This test is no t yet approved or cleared by the [place] FDA and  has been authorized for detection and/or diagnosis of SARS-CoV-2 by FDA under an Emergency Use Authorization (EUA). This EUA will remain  in effect (meaning this test can be used) for the duration of the COVID-19 declaration under Section 564(b)(1) of the Act, 21 U.S.C.section 360bbb-3(b)(1), unless the authorization is terminated  or revoked sooner.       Influenza A by PCR NEGATIVE NEGATIVE   Influenza B by PCR NEGATIVE NEGATIVE    Comment: (NOTE) The Xpert Xpress SARS-CoV-2/FLU/RSV plus assay is intended as an aid in the diagnosis of influenza from Nasopharyngeal swab specimens and should not be used as a sole basis for treatment. Nasal washings and aspirates are unacceptable for Xpert Xpress SARS-CoV-2/FLU/RSV testing.  Fact Sheet for Patients: EntrepreneurPulse.com.au  Fact Sheet for Healthcare Providers: IncredibleEmployment.be  This test is not yet approved or cleared by the [place] FDA and has been authorized for detection and/or diagnosis of SARS-CoV-2 by FDA under an Emergency Use Authorization (EUA). This EUA will remain in  effect (meaning this test can be used) for the duration of the COVID-19 declaration under Section 564(b)(1) of the Act, 21 U.S.C. section 360bbb-3(b)(1), unless the  authorization is terminated or revoked.  Performed at [facility], [place], [place] [address]   Procalcitonin - Baseline     Status: None   Collection Time: 06/11/21  1:44 PM  Result Value Ref Range   Procalcitonin <0.10 ng/mL    Comment:        Interpretation: PCT (Procalcitonin) <= 0.5 ng/mL: Systemic infection (sepsis) is not likely. Local bacterial infection is possible. (NOTE)       Sepsis PCT Algorithm           Lower Respiratory Tract                                      Infection PCT Algorithm    ----------------------------     ----------------------------         PCT < 0.25 ng/mL                PCT < 0.10 ng/mL          Strongly encourage             Strongly discourage   discontinuation of antibiotics    initiation of antibiotics    ----------------------------     -----------------------------       PCT 0.25 - 0.50 ng/mL            PCT 0.10 - 0.25 ng/mL               OR       >80% decrease in PCT            Discourage initiation of                                            antibiotics      Encourage discontinuation           of antibiotics    ----------------------------     -----------------------------         PCT >= 0.50 ng/mL              PCT 0.26 - 0.50 ng/mL               AND        <80% decrease in PCT             Encourage initiation of                                             antibiotics       Encourage continuation           of antibiotics    ----------------------------     -----------------------------        PCT >= 0.50 ng/mL                  PCT > 0.50 ng/mL               AND         increase in PCT  Strongly encourage                                      initiation of antibiotics    Strongly encourage escalation           of antibiotics                                     -----------------------------                                           PCT <= 0.25 ng/mL                                                  OR                                        > 80% decrease in PCT                                      Discontinue / Do not initiate                                             antibiotics  Performed at [facility], [address] [place], [place] [address]   Hemoglobin A1c     Status: Abnormal   Collection Time: 06/11/21  1:44 PM  Result Value Ref Range   Hgb A1c MFr Bld 6.6 (H) 4.8 - 5.6 %    Comment: (NOTE) Pre diabetes:          5.7%-6.4%  Diabetes:              >6.4%  Glycemic control for   <7.0% adults with diabetes    Mean Plasma Glucose 142.72 mg/dL    Comment: Performed at [facility], [place] [address], [place], [place] [address]  CBG monitoring, ED     Status: Abnormal   Collection Time: 06/11/21  6:05 PM  Result Value Ref Range   Glucose-Capillary 247 (H) 70 - 99 mg/dL    Comment: Glucose reference range applies only to samples taken after fasting for at least 8 hours.  CBG monitoring, ED     Status: Abnormal   Collection Time: 06/11/21  8:07 PM  Result Value Ref Range   Glucose-Capillary 305 (H) 70 - 99 mg/dL    Comment: Glucose reference range applies only to samples taken after fasting for at least 8 hours.  CBG monitoring, ED     Status: Abnormal   Collection Time: 06/11/21  9:01 PM  Result Value Ref Range   Glucose-Capillary 290 (H) 70 - 99 mg/dL    Comment: Glucose reference range applies only to samples taken after fasting for at least 8 hours.  Basic metabolic panel  Status: Abnormal   Collection Time: 06/12/21  5:29 AM  Result Value Ref Range   Sodium 136 135 - 145 mmol/L   Potassium 3.8 3.5 - 5.1 mmol/L   Chloride 101 98 - 111 mmol/L   CO2 26 22 - 32 mmol/L   Glucose, Bld 275 (H) 70 - 99 mg/dL    Comment: Glucose reference range applies only to samples taken after fasting for at least 8 hours.   BUN 21 8 - 23 mg/dL   Creatinine, Ser 0.70 0.44 - 1.00 mg/dL   Calcium 8.5 (L) 8.9 - 10.3 mg/dL   GFR, Estimated >60 >60  mL/min    Comment: (NOTE) Calculated using the CKD-EPI Creatinine Equation (2021)    Anion gap 9 5 - 15    Comment: Performed at [facility], [address] [place], [place] [address]  CBC WITH DIFFERENTIAL     Status: Abnormal   Collection Time: 06/12/21  5:29 AM  Result Value Ref Range   WBC 7.4 4.0 - 10.5 K/uL   RBC 3.47 (L) 3.87 - 5.11 MIL/uL   Hemoglobin 10.3 (L) 12.0 - 15.0 g/dL   HCT 32.8 (L) 36.0 - 46.0 %   MCV 94.5 80.0 - 100.0 fL   MCH 29.7 26.0 - 34.0 pg   MCHC 31.4 30.0 - 36.0 g/dL   RDW 13.8 11.5 - 15.5 %   Platelets 211 150 - 400 K/uL   nRBC 0.0 0.0 - 0.2 %   Neutrophils Relative % 80 %   Neutro Abs 5.9 1.7 - 7.7 K/uL   Lymphocytes Relative 10 %   Lymphs Abs 0.8 0.7 - 4.0 K/uL   Monocytes Relative 10 %   Monocytes Absolute 0.7 0.1 - 1.0 K/uL   Eosinophils Relative 0 %   Eosinophils Absolute 0.0 0.0 - 0.5 K/uL   Basophils Relative 0 %   Basophils Absolute 0.0 0.0 - 0.1 K/uL   Immature Granulocytes 0 %   Abs Immature Granulocytes 0.02 0.00 - 0.07 K/uL    Comment: Performed at [facility], [address] [place], [place] [address]  CBG monitoring, ED     Status: Abnormal   Collection Time: 06/12/21  8:39 AM  Result Value Ref Range   Glucose-Capillary 179 (H) 70 - 99 mg/dL    Comment: Glucose reference range applies only to samples taken after fasting for at least 8 hours.  CBG monitoring, ED     Status: Abnormal   Collection Time: 06/12/21 12:18 PM  Result Value Ref Range   Glucose-Capillary 118 (H) 70 - 99 mg/dL    Comment: Glucose reference range applies only to samples taken after fasting for at least 8 hours.  CBG monitoring, ED     Status: Abnormal   Collection Time: 06/12/21  4:41 PM  Result Value Ref Range   Glucose-Capillary 233 (H) 70 - 99 mg/dL    Comment: Glucose reference range applies only to samples taken after fasting for at least 8 hours.  Glucose, capillary     Status: Abnormal   Collection Time: 06/12/21  9:03 PM   Result Value Ref Range   Glucose-Capillary 152 (H) 70 - 99 mg/dL    Comment: Glucose reference range applies only to samples taken after fasting for at least 8 hours.  HIV Antibody (routine testing w rflx)     Status: None   Collection Time: 06/13/21  6:35 AM  Result Value Ref Range   HIV Screen 4th Generation wRfx Non Reactive Non  Reactive    Comment: Performed at [facility], [place] [address], [place], [place] [address]  Glucose, capillary     Status: Abnormal   Collection Time: 06/13/21  7:28 AM  Result Value Ref Range   Glucose-Capillary 69 (L) 70 - 99 mg/dL    Comment: Glucose reference range applies only to samples taken after fasting for at least 8 hours.  MRSA Next Gen by PCR, Nasal     Status: None   Collection Time: 06/13/21  8:00 AM   Specimen: Nasal Mucosa; Nasal Swab  Result Value Ref Range   MRSA by PCR Next Gen NOT DETECTED NOT DETECTED    Comment: (NOTE) The GeneXpert MRSA Assay (FDA approved for NASAL specimens only), is one component of a comprehensive MRSA colonization surveillance program. It is not intended to diagnose MRSA infection nor to guide or monitor treatment for MRSA infections. Test performance is not FDA approved in patients less than 68 years old. Performed at [facility], [address] [place], [place] [address]   Glucose, capillary     Status: Abnormal   Collection Time: 06/13/21  8:39 AM  Result Value Ref Range   Glucose-Capillary 111 (H) 70 - 99 mg/dL    Comment: Glucose reference range applies only to samples taken after fasting for at least 8 hours.  Glucose, capillary     Status: Abnormal   Collection Time: 06/13/21 11:32 AM  Result Value Ref Range   Glucose-Capillary 149 (H) 70 - 99 mg/dL    Comment: Glucose reference range applies only to samples taken after fasting for at least 8 hours.  Glucose, capillary     Status: Abnormal   Collection Time: 06/13/21  3:54 PM  Result Value Ref Range   Glucose-Capillary 258 (H)  70 - 99 mg/dL    Comment: Glucose reference range applies only to samples taken after fasting for at least 8 hours.  Glucose, capillary     Status: Abnormal   Collection Time: 06/13/21  8:27 PM  Result Value Ref Range   Glucose-Capillary 201 (H) 70 - 99 mg/dL    Comment: Glucose reference range applies only to samples taken after fasting for at least 8 hours.  Glucose, capillary     Status: Abnormal   Collection Time: 06/14/21  7:38 AM  Result Value Ref Range   Glucose-Capillary 153 (H) 70 - 99 mg/dL    Comment: Glucose reference range applies only to samples taken after fasting for at least 8 hours.  Glucose, capillary     Status: None   Collection Time: 06/14/21 11:53 AM  Result Value Ref Range   Glucose-Capillary 84 70 - 99 mg/dL    Comment: Glucose reference range applies only to samples taken after fasting for at least 8 hours.  Resp Panel by RT-PCR (Flu A&B, Covid) Nasopharyngeal Swab     Status: None   Collection Time: 06/26/21 11:41 AM   Specimen: Nasopharyngeal Swab; Nasopharyngeal(NP) swabs in vial transport medium  Result Value Ref Range   SARS Coronavirus 2 by RT PCR NEGATIVE NEGATIVE    Comment: (NOTE) SARS-CoV-2 target nucleic acids are NOT DETECTED.  The SARS-CoV-2 RNA is generally detectable in upper respiratory specimens during the acute phase of infection. The lowest concentration of SARS-CoV-2 viral copies this assay can detect is 138 copies/mL. A negative result does not preclude SARS-Cov-2 infection and should not be used as the sole basis for treatment or other patient management decisions. A negative result may occur with  improper specimen collection/handling, submission of specimen other than nasopharyngeal swab, presence of viral mutation(s) within the areas targeted by this assay, and inadequate number of viral copies(<138 copies/mL). A negative result must be combined with clinical observations, patient history, and epidemiological information. The  expected result is Negative.  Fact Sheet for Patients:  EntrepreneurPulse.com.au  Fact Sheet for Healthcare Providers:  IncredibleEmployment.be  This test is no t yet approved or cleared by the [place] FDA and  has been authorized for detection and/or diagnosis of SARS-CoV-2 by FDA under an Emergency Use Authorization (EUA). This EUA will remain  in effect (meaning this test can be used) for the duration of the COVID-19 declaration under Section 564(b)(1) of the Act, 21 U.S.C.section 360bbb-3(b)(1), unless the authorization is terminated  or revoked sooner.       Influenza A by PCR NEGATIVE NEGATIVE   Influenza B by PCR NEGATIVE NEGATIVE    Comment: (NOTE) The Xpert Xpress SARS-CoV-2/FLU/RSV plus assay is intended as an aid in the diagnosis of influenza from Nasopharyngeal swab specimens and should not be used as a sole basis for treatment. Nasal washings and aspirates are unacceptable for Xpert Xpress SARS-CoV-2/FLU/RSV testing.  Fact Sheet for Patients: EntrepreneurPulse.com.au  Fact Sheet for Healthcare Providers: IncredibleEmployment.be  This test is not yet approved or cleared by the [place] FDA and has been authorized for detection and/or diagnosis of SARS-CoV-2 by FDA under an Emergency Use Authorization (EUA). This EUA will remain in effect (meaning this test can be used) for the duration of the COVID-19 declaration under Section 564(b)(1) of the Act, 21 U.S.C. section 360bbb-3(b)(1), unless the authorization is terminated or revoked.  Performed at [facility], [address] [place], [place] [address]   Comprehensive metabolic panel     Status: Abnormal   Collection Time: 06/26/21 11:41 AM  Result Value Ref Range   Sodium 137 135 - 145 mmol/L   Potassium 3.8 3.5 - 5.1 mmol/L   Chloride 102 98 - 111 mmol/L   CO2 27 22 - 32 mmol/L   Glucose, Bld 178 (H) 70 - 99 mg/dL     Comment: Glucose reference range applies only to samples taken after fasting for at least 8 hours.   BUN 23 8 - 23 mg/dL   Creatinine, Ser 0.62 0.44 - 1.00 mg/dL   Calcium 9.0 8.9 - 10.3 mg/dL   Total Protein 6.9 6.5 - 8.1 g/dL   Albumin 3.8 3.5 - 5.0 g/dL   AST 40 15 - 41 U/L   ALT 38 0 - 44 U/L   Alkaline Phosphatase 70 38 - 126 U/L   Total Bilirubin 0.6 0.3 - 1.2 mg/dL   GFR, Estimated >60 >60 mL/min    Comment: (NOTE) Calculated using the CKD-EPI Creatinine Equation (2021)    Anion gap 8 5 - 15    Comment: Performed at [facility], [address] [place], [place] [address]  CBC with Differential     Status: Abnormal   Collection Time: 06/26/21 11:41 AM  Result Value Ref Range   WBC 5.3 4.0 - 10.5 K/uL   RBC 3.69 (L) 3.87 - 5.11 MIL/uL   Hemoglobin 10.8 (L) 12.0 - 15.0 g/dL   HCT 35.3 (L) 36.0 - 46.0 %   MCV 95.7 80.0 - 100.0 fL   MCH 29.3 26.0 - 34.0 pg   MCHC 30.6 30.0 - 36.0 g/dL   RDW 13.2 11.5 - 15.5 %   Platelets 207 150 - 400 K/uL  nRBC 0.0 0.0 - 0.2 %   Neutrophils Relative % 71 %   Neutro Abs 3.7 1.7 - 7.7 K/uL   Lymphocytes Relative 15 %   Lymphs Abs 0.8 0.7 - 4.0 K/uL   Monocytes Relative 11 %   Monocytes Absolute 0.6 0.1 - 1.0 K/uL   Eosinophils Relative 3 %   Eosinophils Absolute 0.2 0.0 - 0.5 K/uL   Basophils Relative 0 %   Basophils Absolute 0.0 0.0 - 0.1 K/uL   Immature Granulocytes 0 %   Abs Immature Granulocytes 0.02 0.00 - 0.07 K/uL    Comment: Performed at [facility], [address] [place], [place] [address]  Urinalysis, Routine w reflex microscopic Urine, Clean Catch     Status: Abnormal   Collection Time: 06/26/21  2:01 PM  Result Value Ref Range   Color, Urine YELLOW (A) YELLOW   APPearance CLEAR (A) CLEAR   Specific Gravity, Urine 1.013 1.005 - 1.030   pH 5.0 5.0 - 8.0   Glucose, UA NEGATIVE NEGATIVE mg/dL   Hgb urine dipstick SMALL (A) NEGATIVE   Bilirubin Urine NEGATIVE NEGATIVE   Ketones, ur NEGATIVE  NEGATIVE mg/dL   Protein, ur 100 (A) NEGATIVE mg/dL   Nitrite NEGATIVE NEGATIVE   Leukocytes,Ua NEGATIVE NEGATIVE   RBC / HPF 0-5 0 - 5 RBC/hpf   WBC, UA 0-5 0 - 5 WBC/hpf   Bacteria, UA NONE SEEN NONE SEEN   Squamous Epithelial / LPF 0-5 0 - 5   Mucus PRESENT     Comment: Performed at [facility], [address] [place], [place] [address]  Blood culture (routine x 2)     Status: None   Collection Time: 06/26/21  2:01 PM   Specimen: BLOOD  Result Value Ref Range   Specimen Description BLOOD RIGHT ANTECUBITAL    Special Requests      BOTTLES DRAWN AEROBIC AND ANAEROBIC Blood Culture adequate volume   Culture      NO GROWTH 5 DAYS Performed at [facility], [address] [place], [place] [address]    Report Status 07/01/2021 FINAL   Blood culture (routine x 2)     Status: None   Collection Time: 06/26/21  2:01 PM   Specimen: BLOOD  Result Value Ref Range   Specimen Description BLOOD LEFT ANTECUBITAL    Special Requests      BOTTLES DRAWN AEROBIC AND ANAEROBIC Blood Culture adequate volume   Culture      NO GROWTH 5 DAYS Performed at [facility], [address] [place], [place] [address]    Report Status 07/01/2021 FINAL   Blood gas, venous     Status: Abnormal   Collection Time: 06/26/21  3:13 PM  Result Value Ref Range   pH, Ven 7.35 7.250 - 7.430   pCO2, Ven 52 44.0 - 60.0 mmHg   pO2, Ven 59.0 (H) 32.0 - 45.0 mmHg   Bicarbonate 28.7 (H) 20.0 - 28.0 mmol/L   Acid-Base Excess 2.3 (H) 0.0 - 2.0 mmol/L   O2 Saturation 88.7 %   Patient temperature 37.0    Collection site VEIN    Sample type VEIN     Comment: Performed at [facility], [address] [place], [place] [address]  Glucose, capillary     Status: Abnormal   Collection Time: 06/26/21  5:46 PM  Result Value Ref Range   Glucose-Capillary 384 (H) 70 - 99 mg/dL    Comment: Glucose reference range applies only to  samples taken after fasting for at least 8 hours.   Glucose, capillary     Status: Abnormal   Collection Time: 06/26/21  5:50 PM  Result Value Ref Range   Glucose-Capillary 343 (H) 70 - 99 mg/dL    Comment: Glucose reference range applies only to samples taken after fasting for at least 8 hours.  Glucose, capillary     Status: Abnormal   Collection Time: 06/26/21  8:45 PM  Result Value Ref Range   Glucose-Capillary 229 (H) 70 - 99 mg/dL    Comment: Glucose reference range applies only to samples taken after fasting for at least 8 hours.   Comment 1 Notify RN   Basic metabolic panel     Status: Abnormal   Collection Time: 06/27/21  5:53 AM  Result Value Ref Range   Sodium 135 135 - 145 mmol/L   Potassium 4.1 3.5 - 5.1 mmol/L   Chloride 100 98 - 111 mmol/L   CO2 28 22 - 32 mmol/L   Glucose, Bld 329 (H) 70 - 99 mg/dL    Comment: Glucose reference range applies only to samples taken after fasting for at least 8 hours.   BUN 22 8 - 23 mg/dL   Creatinine, Ser 0.66 0.44 - 1.00 mg/dL   Calcium 8.9 8.9 - 10.3 mg/dL   GFR, Estimated >60 >60 mL/min    Comment: (NOTE) Calculated using the CKD-EPI Creatinine Equation (2021)    Anion gap 7 5 - 15    Comment: Performed at [facility], [address] [place], [place] [address]  CBC     Status: Abnormal   Collection Time: 06/27/21  5:53 AM  Result Value Ref Range   WBC 11.6 (H) 4.0 - 10.5 K/uL   RBC 3.61 (L) 3.87 - 5.11 MIL/uL   Hemoglobin 10.3 (L) 12.0 - 15.0 g/dL   HCT 33.5 (L) 36.0 - 46.0 %   MCV 92.8 80.0 - 100.0 fL   MCH 28.5 26.0 - 34.0 pg   MCHC 30.7 30.0 - 36.0 g/dL   RDW 13.2 11.5 - 15.5 %   Platelets 233 150 - 400 K/uL   nRBC 0.0 0.0 - 0.2 %    Comment: Performed at [facility], [address] [place], [place] [address]  Glucose, capillary     Status: Abnormal   Collection Time: 06/27/21  7:30 AM  Result Value Ref Range   Glucose-Capillary 278 (H) 70 - 99 mg/dL    Comment: Glucose reference range applies only to samples taken after fasting for at least  8 hours.  Glucose, capillary     Status: Abnormal   Collection Time: 06/27/21 11:41 AM  Result Value Ref Range   Glucose-Capillary 229 (H) 70 - 99 mg/dL    Comment: Glucose reference range applies only to samples taken after fasting for at least 8 hours.  Glucose, capillary     Status: Abnormal   Collection Time: 06/27/21  4:12 PM  Result Value Ref Range   Glucose-Capillary 201 (H) 70 - 99 mg/dL    Comment: Glucose reference range applies only to samples taken after fasting for at least 8 hours.  Glucose, capillary     Status: Abnormal   Collection Time: 06/27/21  9:10 PM  Result Value Ref Range   Glucose-Capillary 344 (H) 70 - 99 mg/dL    Comment: Glucose reference range applies only to samples taken after fasting for at least 8 hours.  Procalcitonin     Status: None  Collection Time: 06/28/21  3:55 AM  Result Value Ref Range   Procalcitonin <0.10 ng/mL    Comment:        Interpretation: PCT (Procalcitonin) <= 0.5 ng/mL: Systemic infection (sepsis) is not likely. Local bacterial infection is possible. (NOTE)       Sepsis PCT Algorithm           Lower Respiratory Tract                                      Infection PCT Algorithm    ----------------------------     ----------------------------         PCT < 0.25 ng/mL                PCT < 0.10 ng/mL          Strongly encourage             Strongly discourage   discontinuation of antibiotics    initiation of antibiotics    ----------------------------     -----------------------------       PCT 0.25 - 0.50 ng/mL            PCT 0.10 - 0.25 ng/mL               OR       >80% decrease in PCT            Discourage initiation of                                            antibiotics      Encourage discontinuation           of antibiotics    ----------------------------     -----------------------------         PCT >= 0.50 ng/mL              PCT 0.26 - 0.50 ng/mL               AND        <80% decrease in PCT             Encourage  initiation of                                             antibiotics       Encourage continuation           of antibiotics    ----------------------------     -----------------------------        PCT >= 0.50 ng/mL                  PCT > 0.50 ng/mL               AND         increase in PCT                  Strongly encourage                                      initiation of antibiotics    Strongly encourage escalation  of antibiotics                                     -----------------------------                                           PCT <= 0.25 ng/mL                                                 OR                                        > 80% decrease in PCT                                      Discontinue / Do not initiate                                             antibiotics  Performed at [facility], [address] [place], [place] [address]   CBC     Status: Abnormal   Collection Time: 06/28/21  3:55 AM  Result Value Ref Range   WBC 10.1 4.0 - 10.5 K/uL   RBC 3.37 (L) 3.87 - 5.11 MIL/uL   Hemoglobin 9.8 (L) 12.0 - 15.0 g/dL   HCT 32.1 (L) 36.0 - 46.0 %   MCV 95.3 80.0 - 100.0 fL   MCH 29.1 26.0 - 34.0 pg   MCHC 30.5 30.0 - 36.0 g/dL   RDW 13.7 11.5 - 15.5 %   Platelets 215 150 - 400 K/uL   nRBC 0.0 0.0 - 0.2 %    Comment: Performed at [facility], [address] [place], [place] [address]  Glucose, capillary     Status: Abnormal   Collection Time: 06/28/21  7:40 AM  Result Value Ref Range   Glucose-Capillary 252 (H) 70 - 99 mg/dL    Comment: Glucose reference range applies only to samples taken after fasting for at least 8 hours.  Glucose, capillary     Status: Abnormal   Collection Time: 06/28/21 11:30 AM  Result Value Ref Range   Glucose-Capillary 165 (H) 70 - 99 mg/dL    Comment: Glucose reference range applies only to samples taken after fasting for at least 8 hours.  Glucose, capillary     Status: Abnormal    Collection Time: 06/28/21  4:35 PM  Result Value Ref Range   Glucose-Capillary 248 (H) 70 - 99 mg/dL    Comment: Glucose reference range applies only to samples taken after fasting for at least 8 hours.  Glucose, capillary     Status: Abnormal   Collection Time: 06/28/21  8:40 PM  Result Value Ref Range   Glucose-Capillary 140 (H) 70 - 99 mg/dL    Comment: Glucose reference range applies only to samples taken after fasting for at least 8 hours.  Comment 1 Notify RN    Comment 2 Document in Chart   Glucose, capillary     Status: Abnormal   Collection Time: 06/29/21  7:48 AM  Result Value Ref Range   Glucose-Capillary 286 (H) 70 - 99 mg/dL    Comment: Glucose reference range applies only to samples taken after fasting for at least 8 hours.  CBC with Differential     Status: Abnormal   Collection Time: 07/08/21  9:32 PM  Result Value Ref Range   WBC 5.1 4.0 - 10.5 K/uL   RBC 3.54 (L) 3.87 - 5.11 MIL/uL   Hemoglobin 10.5 (L) 12.0 - 15.0 g/dL   HCT 33.6 (L) 36.0 - 46.0 %   MCV 94.9 80.0 - 100.0 fL   MCH 29.7 26.0 - 34.0 pg   MCHC 31.3 30.0 - 36.0 g/dL   RDW 14.1 11.5 - 15.5 %   Platelets 236 150 - 400 K/uL   nRBC 0.0 0.0 - 0.2 %   Neutrophils Relative % 69 %   Neutro Abs 3.5 1.7 - 7.7 K/uL   Lymphocytes Relative 17 %   Lymphs Abs 0.8 0.7 - 4.0 K/uL   Monocytes Relative 12 %   Monocytes Absolute 0.6 0.1 - 1.0 K/uL   Eosinophils Relative 2 %   Eosinophils Absolute 0.1 0.0 - 0.5 K/uL   Basophils Relative 0 %   Basophils Absolute 0.0 0.0 - 0.1 K/uL   Immature Granulocytes 0 %   Abs Immature Granulocytes 0.02 0.00 - 0.07 K/uL    Comment: Performed at [facility], [address] [place], [place] [address]  Comprehensive metabolic panel     Status: Abnormal   Collection Time: 07/08/21  9:32 PM  Result Value Ref Range   Sodium 135 135 - 145 mmol/L   Potassium 3.7 3.5 - 5.1 mmol/L   Chloride 102 98 - 111 mmol/L   CO2 27 22 - 32 mmol/L   Glucose, Bld 163 (H) 70 - 99  mg/dL    Comment: Glucose reference range applies only to samples taken after fasting for at least 8 hours.   BUN 23 8 - 23 mg/dL   Creatinine, Ser 0.52 0.44 - 1.00 mg/dL   Calcium 8.7 (L) 8.9 - 10.3 mg/dL   Total Protein 6.8 6.5 - 8.1 g/dL   Albumin 3.7 3.5 - 5.0 g/dL   AST 150 (H) 15 - 41 U/L   ALT 134 (H) 0 - 44 U/L   Alkaline Phosphatase 80 38 - 126 U/L   Total Bilirubin 0.6 0.3 - 1.2 mg/dL   GFR, Estimated >60 >60 mL/min    Comment: (NOTE) Calculated using the CKD-EPI Creatinine Equation (2021)    Anion gap 6 5 - 15    Comment: Performed at [facility], [address], [place], [place] [address]  Troponin I (High Sensitivity)     Status: Abnormal   Collection Time: 07/08/21  9:32 PM  Result Value Ref Range   Troponin I (High Sensitivity) 18 (H) <18 ng/L    Comment: (NOTE) Elevated high sensitivity troponin I (hsTnI) values and significant  changes across serial measurements may suggest ACS but many other  chronic and acute conditions are known to elevate hsTnI results.  Refer to the "Links" section for chest pain algorithms and additional  guidance. Performed at [facility], [address] [place], [place] [address]   Urinalysis, Routine w reflex microscopic Urine, Clean Catch     Status: Abnormal   Collection  Time: 07/08/21  9:33 PM  Result Value Ref Range   Color, Urine STRAW (A) YELLOW   APPearance CLEAR (A) CLEAR   Specific Gravity, Urine 1.008 1.005 - 1.030   pH 6.0 5.0 - 8.0   Glucose, UA NEGATIVE NEGATIVE mg/dL   Hgb urine dipstick NEGATIVE NEGATIVE   Bilirubin Urine NEGATIVE NEGATIVE   Ketones, ur NEGATIVE NEGATIVE mg/dL   Protein, ur NEGATIVE NEGATIVE mg/dL   Nitrite NEGATIVE NEGATIVE   Leukocytes,Ua NEGATIVE NEGATIVE    Comment: Performed at [facility], [place], [place] [address]  Troponin I (High Sensitivity)     Status: None   Collection Time: 07/09/21 12:02 AM  Result Value Ref Range   Troponin I  (High Sensitivity) 13 <18 ng/L    Comment: (NOTE) Elevated high sensitivity troponin I (hsTnI) values and significant  changes across serial measurements may suggest ACS but many other  chronic and acute conditions are known to elevate hsTnI results.  Refer to the "Links" section for chest pain algorithms and additional  guidance. Performed at [facility], [address] [place], [place] [address]   Resp Panel by RT-PCR (Flu A&B, Covid) Nasopharyngeal Swab     Status: None   Collection Time: 07/09/21  1:19 AM   Specimen: Nasopharyngeal Swab; Nasopharyngeal(NP) swabs in vial transport medium  Result Value Ref Range   SARS Coronavirus 2 by RT PCR NEGATIVE NEGATIVE    Comment: (NOTE) SARS-CoV-2 target nucleic acids are NOT DETECTED.  The SARS-CoV-2 RNA is generally detectable in upper respiratory specimens during the acute phase of infection. The lowest concentration of SARS-CoV-2 viral copies this assay can detect is 138 copies/mL. A negative result does not preclude SARS-Cov-2 infection and should not be used as the sole basis for treatment or other patient management decisions. A negative result may occur with  improper specimen collection/handling, submission of specimen other than nasopharyngeal swab, presence of viral mutation(s) within the areas targeted by this assay, and inadequate number of viral copies(<138 copies/mL). A negative result must be combined with clinical observations, patient history, and epidemiological information. The expected result is Negative.  Fact Sheet for Patients:  EntrepreneurPulse.com.au  Fact Sheet for Healthcare Providers:  IncredibleEmployment.be  This test is no t yet approved or cleared by the [place] FDA and  has been authorized for detection and/or diagnosis of SARS-CoV-2 by FDA under an Emergency Use Authorization (EUA). This EUA will remain  in effect (meaning this test can be used)  for the duration of the COVID-19 declaration under Section 564(b)(1) of the Act, 21 U.S.C.section 360bbb-3(b)(1), unless the authorization is terminated  or revoked sooner.       Influenza A by PCR NEGATIVE NEGATIVE   Influenza B by PCR NEGATIVE NEGATIVE    Comment: (NOTE) The Xpert Xpress SARS-CoV-2/FLU/RSV plus assay is intended as an aid in the diagnosis of influenza from Nasopharyngeal swab specimens and should not be used as a sole basis for treatment. Nasal washings and aspirates are unacceptable for Xpert Xpress SARS-CoV-2/FLU/RSV testing.  Fact Sheet for Patients: EntrepreneurPulse.com.au  Fact Sheet for Healthcare Providers: IncredibleEmployment.be  This test is not yet approved or cleared by the [place] FDA and has been authorized for detection and/or diagnosis of SARS-CoV-2 by FDA under an Emergency Use Authorization (EUA). This EUA will remain in effect (meaning this test can be used) for the duration of the COVID-19 declaration under Section 564(b)(1) of the Act, 21 U.S.C. section 360bbb-3(b)(1), unless the authorization is terminated  or revoked.  Performed at [facility], [address] [place], [place] [address]   Basic metabolic panel     Status: Abnormal   Collection Time: 07/09/21  7:22 AM  Result Value Ref Range   Sodium 140 135 - 145 mmol/L   Potassium 3.9 3.5 - 5.1 mmol/L   Chloride 106 98 - 111 mmol/L   CO2 30 22 - 32 mmol/L   Glucose, Bld 172 (H) 70 - 99 mg/dL    Comment: Glucose reference range applies only to samples taken after fasting for at least 8 hours.   BUN 14 8 - 23 mg/dL   Creatinine, Ser 0.52 0.44 - 1.00 mg/dL   Calcium 8.2 (L) 8.9 - 10.3 mg/dL   GFR, Estimated >60 >60 mL/min    Comment: (NOTE) Calculated using the CKD-EPI Creatinine Equation (2021)    Anion gap 4 (L) 5 - 15    Comment: Performed at [facility], [address] [place], [place] [address]  CBC      Status: Abnormal   Collection Time: 07/09/21  7:22 AM  Result Value Ref Range   WBC 4.2 4.0 - 10.5 K/uL   RBC 3.24 (L) 3.87 - 5.11 MIL/uL   Hemoglobin 9.5 (L) 12.0 - 15.0 g/dL   HCT 31.0 (L) 36.0 - 46.0 %   MCV 95.7 80.0 - 100.0 fL   MCH 29.3 26.0 - 34.0 pg   MCHC 30.6 30.0 - 36.0 g/dL   RDW 14.3 11.5 - 15.5 %   Platelets 219 150 - 400 K/uL   nRBC 0.0 0.0 - 0.2 %    Comment: Performed at [facility], [address] [place],  [address]  CBG monitoring, ED     Status: Abnormal   Collection Time: 07/09/21  7:27 AM  Result Value Ref Range   Glucose-Capillary 167 (H) 70 - 99 mg/dL    Comment: Glucose reference range applies only to samples taken after fasting for at least 8 hours.   Comment 1 QC Due   CBG monitoring, ED     Status: Abnormal   Collection Time: 07/09/21 11:21 AM  Result Value Ref Range   Glucose-Capillary 115 (H) 70 - 99 mg/dL    Comment: Glucose reference range applies only to samples taken after fasting for at least 8 hours.  CBG monitoring, ED     Status: Abnormal   Collection Time: 07/09/21  5:01 PM  Result Value Ref Range   Glucose-Capillary 177 (H) 70 - 99 mg/dL    Comment: Glucose reference range applies only to samples taken after fasting for at least 8 hours.  CBG monitoring, ED     Status: Abnormal   Collection Time: 07/09/21  9:07 PM  Result Value Ref Range   Glucose-Capillary 219 (H) 70 - 99 mg/dL    Comment: Glucose reference range applies only to samples taken after fasting for at least 8 hours.  CBG monitoring, ED     Status: None   Collection Time: 07/10/21  9:06 AM  Result Value Ref Range   Glucose-Capillary 94 70 - 99 mg/dL    Comment: Glucose reference range applies only to samples taken after fasting for at least 8 hours.  CBG monitoring, ED     Status: Abnormal   Collection Time: 07/10/21 11:21 AM  Result Value Ref Range   Glucose-Capillary 180 (H) 70 - 99 mg/dL    Comment: Glucose reference range applies only to samples  taken after fasting for at  least 8 hours.  Comprehensive metabolic panel     Status: Abnormal   Collection Time: 07/10/21  8:43 PM  Result Value Ref Range   Sodium 138 135 - 145 mmol/L   Potassium 4.3 3.5 - 5.1 mmol/L   Chloride 103 98 - 111 mmol/L   CO2 30 22 - 32 mmol/L   Glucose, Bld 319 (H) 70 - 99 mg/dL    Comment: Glucose reference range applies only to samples taken after fasting for at least 8 hours.   BUN 21 8 - 23 mg/dL   Creatinine, Ser 0.88 0.44 - 1.00 mg/dL   Calcium 8.2 (L) 8.9 - 10.3 mg/dL   Total Protein 6.5 6.5 - 8.1 g/dL   Albumin 3.4 (L) 3.5 - 5.0 g/dL   AST 57 (H) 15 - 41 U/L   ALT 81 (H) 0 - 44 U/L   Alkaline Phosphatase 71 38 - 126 U/L   Total Bilirubin 0.4 0.3 - 1.2 mg/dL   GFR, Estimated >60 >60 mL/min    Comment: (NOTE) Calculated using the CKD-EPI Creatinine Equation (2021)    Anion gap 5 5 - 15    Comment: Performed at [facility], [address] [place], [place] [address]  CBC with Differential     Status: Abnormal   Collection Time: 07/10/21  8:43 PM  Result Value Ref Range   WBC 8.4 4.0 - 10.5 K/uL   RBC 3.44 (L) 3.87 - 5.11 MIL/uL   Hemoglobin 10.1 (L) 12.0 - 15.0 g/dL   HCT 33.4 (L) 36.0 - 46.0 %   MCV 97.1 80.0 - 100.0 fL   MCH 29.4 26.0 - 34.0 pg   MCHC 30.2 30.0 - 36.0 g/dL   RDW 13.9 11.5 - 15.5 %   Platelets 207 150 - 400 K/uL   nRBC 0.0 0.0 - 0.2 %   Neutrophils Relative % 86 %   Neutro Abs 7.2 1.7 - 7.7 K/uL   Lymphocytes Relative 7 %   Lymphs Abs 0.6 (L) 0.7 - 4.0 K/uL   Monocytes Relative 6 %   Monocytes Absolute 0.5 0.1 - 1.0 K/uL   Eosinophils Relative 1 %   Eosinophils Absolute 0.1 0.0 - 0.5 K/uL   Basophils Relative 0 %   Basophils Absolute 0.0 0.0 - 0.1 K/uL   Immature Granulocytes 0 %   Abs Immature Granulocytes 0.03 0.00 - 0.07 K/uL    Comment: Performed at [facility], [place], [place] [address]  Troponin I (High Sensitivity)     Status: Abnormal   Collection Time: 07/10/21   8:43 PM  Result Value Ref Range   Troponin I (High Sensitivity) 25 (H) <18 ng/L    Comment: (NOTE) Elevated high sensitivity troponin I (hsTnI) values and significant  changes across serial measurements may suggest ACS but many other  chronic and acute conditions are known to elevate hsTnI results.  Refer to the "Links" section for chest pain algorithms and additional  guidance. Performed at [facility], [place], [place] [address]   Troponin I (High Sensitivity)     Status: Abnormal   Collection Time: 07/10/21 10:49 PM  Result Value Ref Range   Troponin I (High Sensitivity) 34 (H) <18 ng/L    Comment: (NOTE) Elevated high sensitivity troponin I (hsTnI) values and significant  changes across serial measurements may suggest ACS but many other  chronic and acute conditions are known to elevate hsTnI results.  Refer to the "Links" section for  chest pain algorithms and additional  guidance. Performed at [facility], [address] [place], [place] [address]   D-dimer, quantitative     Status: Abnormal   Collection Time: 07/10/21 10:49 PM  Result Value Ref Range   D-Dimer, Quant 1.32 (H) 0.00 - 0.50 ug/mL-FEU    Comment: (NOTE) At the manufacturer cut-off value of 0.5 g/mL FEU, this assay has a negative predictive value of 95-100%.This assay is intended for use in conjunction with a clinical pretest probability (PTP) assessment model to exclude pulmonary embolism (PE) and deep venous thrombosis (DVT) in outpatients suspected of PE or DVT. Results should be correlated with clinical presentation. Performed at [facility], [address] [place], [place] [address]   Blood gas, arterial     Status: Abnormal   Collection Time: 07/11/21  3:41 AM  Result Value Ref Range   FIO2 1.00    Delivery systems VENTILATOR    Mode ASSIST CONTROL    VT 450 mL   LHR 18 resp/min   Peep/cpap 5.0 cm H20   pH, Arterial 7.06 (LL) 7.350 - 7.450     Comment: CRITICAL RESULT CALLED TO, READ BACK BY AND VERIFIED WITH: Latricia Heft MD AT [phone] 07/11/2021 BY S DAVID RRT    pCO2 arterial 47 32.0 - 48.0 mmHg   pO2, Arterial 309 (H) 83.0 - 108.0 mmHg   Bicarbonate 13.3 (L) 20.0 - 28.0 mmol/L   Acid-base deficit 16.4 (H) 0.0 - 2.0 mmol/L   O2 Saturation 99.8 %   Patient temperature 37.0    Collection site LEFT RADIAL    Sample type ARTERIAL DRAW    Allens test (pass/fail) PASS PASS    Comment: Performed at [facility], [address] [place], [place] [address]  CBG monitoring, ED     Status: Abnormal   Collection Time: 07/11/21  3:45 AM  Result Value Ref Range   Glucose-Capillary 381 (H) 70 - 99 mg/dL    Comment: Glucose reference range applies only to samples taken after fasting for at least 8 hours.  Blood gas, arterial     Status: Abnormal   Collection Time: 07/11/21  5:00 AM  Result Value Ref Range   FIO2 0.50    Delivery systems VENTILATOR    Mode PRESSURE REGULATED VOLUME CONTROL    VT 450 mL   LHR 16 resp/min   Peep/cpap 5.0 cm H20   pH, Arterial 7.34 (L) 7.350 - 7.450   pCO2 arterial 48 32.0 - 48.0 mmHg   pO2, Arterial 108 83.0 - 108.0 mmHg   Bicarbonate 25.9 20.0 - 28.0 mmol/L   Acid-base deficit 0.2 0.0 - 2.0 mmol/L   O2 Saturation 97.9 %   Patient temperature 37.0    Collection site LEFT RADIAL    Sample type ARTERIAL DRAW    Allens test (pass/fail) PASS PASS    Comment: Performed at [facility], [address] [place], [place] [address]  Glucose, capillary     Status: Abnormal   Collection Time: 07/11/21  6:03 AM  Result Value Ref Range   Glucose-Capillary 379 (H) 70 - 99 mg/dL    Comment: Glucose reference range applies only to samples taken after fasting for at least 8 hours.  Urine Drug Screen, Qualitative ([facility] only)     Status: Abnormal   Collection Time: 07/11/21  6:18 AM  Result Value Ref Range   Tricyclic, Ur Screen NONE DETECTED NONE DETECTED   Amphetamines, Ur Screen NONE DETECTED  NONE  DETECTED   MDMA (Ecstasy)Ur Screen NONE DETECTED NONE DETECTED   Cocaine Metabolite,Ur [place] NONE DETECTED NONE DETECTED   Opiate, Ur Screen NONE DETECTED NONE DETECTED   Phencyclidine (PCP) Ur S NONE DETECTED NONE DETECTED   Cannabinoid 50 Ng, Ur [place] NONE DETECTED NONE DETECTED   Barbiturates, Ur Screen NONE DETECTED NONE DETECTED   Benzodiazepine, Ur Scrn POSITIVE (A) NONE DETECTED   Methadone Scn, Ur NONE DETECTED NONE DETECTED    Comment: (NOTE) Tricyclics + metabolites, urine    Cutoff 1000 ng/mL Amphetamines + metabolites, urine  Cutoff 1000 ng/mL MDMA (Ecstasy), urine              Cutoff 500 ng/mL Cocaine Metabolite, urine          Cutoff 300 ng/mL Opiate + metabolites, urine        Cutoff 300 ng/mL Phencyclidine (PCP), urine         Cutoff 25 ng/mL Cannabinoid, urine                 Cutoff 50 ng/mL Barbiturates + metabolites, urine  Cutoff 200 ng/mL Benzodiazepine, urine              Cutoff 200 ng/mL Methadone, urine                   Cutoff 300 ng/mL  The urine drug screen provides only a preliminary, unconfirmed analytical test result and should not be used for non-medical purposes. Clinical consideration and professional judgment should be applied to any positive drug screen result due to possible interfering substances. A more specific alternate chemical method must be used in order to obtain a confirmed analytical result. Gas chromatography / mass spectrometry (GC/MS) is the preferred confirm atory method. Performed at [facility], [address] [place], [place] [address]   MRSA Next Gen by PCR, Nasal     Status: None   Collection Time: 07/11/21  6:21 AM   Specimen: Nasal Mucosa; Nasal Swab  Result Value Ref Range   MRSA by PCR Next Gen NOT DETECTED NOT DETECTED    Comment: (NOTE) The GeneXpert MRSA Assay (FDA approved for NASAL specimens only), is one component of a comprehensive MRSA colonization surveillance program. It is not intended to diagnose  MRSA infection nor to guide or monitor treatment for MRSA infections. Test performance is not FDA approved in patients less than 59 years old. Performed at [facility], [address] [place], [place] [address]   CULTURE, BLOOD (ROUTINE X 2) w Reflex to ID Panel     Status: None   Collection Time: 07/11/21  6:43 AM   Specimen: BLOOD  Result Value Ref Range   Specimen Description BLOOD RIGHT AC    Special Requests      BOTTLES DRAWN AEROBIC AND ANAEROBIC Blood Culture adequate volume   Culture      NO GROWTH 5 DAYS Performed at [facility], [address] [place], [place] [address]    Report Status 07/16/2021 FINAL   CBC with Differential     Status: Abnormal   Collection Time: 07/11/21  6:44 AM  Result Value Ref Range   WBC 15.1 (H) 4.0 - 10.5 K/uL   RBC 3.57 (L) 3.87 - 5.11 MIL/uL   Hemoglobin 10.4 (L) 12.0 - 15.0 g/dL   HCT 34.6 (L) 36.0 - 46.0 %   MCV 96.9 80.0 - 100.0 fL   MCH 29.1 26.0 - 34.0 pg   MCHC 30.1 30.0 - 36.0  g/dL   RDW 13.6 11.5 - 15.5 %   Platelets 238 150 - 400 K/uL   nRBC 0.0 0.0 - 0.2 %   Neutrophils Relative % 92 %   Neutro Abs 13.9 (H) 1.7 - 7.7 K/uL   Lymphocytes Relative 2 %   Lymphs Abs 0.3 (L) 0.7 - 4.0 K/uL   Monocytes Relative 5 %   Monocytes Absolute 0.8 0.1 - 1.0 K/uL   Eosinophils Relative 0 %   Eosinophils Absolute 0.0 0.0 - 0.5 K/uL   Basophils Relative 0 %   Basophils Absolute 0.0 0.0 - 0.1 K/uL   Immature Granulocytes 1 %   Abs Immature Granulocytes 0.10 (H) 0.00 - 0.07 K/uL    Comment: Performed at [facility], [address] [place], [place] [address]  Comprehensive metabolic panel     Status: Abnormal   Collection Time: 07/11/21  6:44 AM  Result Value Ref Range   Sodium 139 135 - 145 mmol/L   Potassium 4.6 3.5 - 5.1 mmol/L   Chloride 104 98 - 111 mmol/L   CO2 27 22 - 32 mmol/L   Glucose, Bld 401 (H) 70 - 99 mg/dL    Comment: Glucose reference range applies only to samples taken after fasting  for at least 8 hours.   BUN 23 8 - 23 mg/dL   Creatinine, Ser 0.97 0.44 - 1.00 mg/dL   Calcium 8.0 (L) 8.9 - 10.3 mg/dL   Total Protein 6.4 (L) 6.5 - 8.1 g/dL   Albumin 3.3 (L) 3.5 - 5.0 g/dL   AST 58 (H) 15 - 41 U/L   ALT 81 (H) 0 - 44 U/L   Alkaline Phosphatase 70 38 - 126 U/L   Total Bilirubin 0.6 0.3 - 1.2 mg/dL   GFR, Estimated >60 >60 mL/min    Comment: (NOTE) Calculated using the CKD-EPI Creatinine Equation (2021)    Anion gap 8 5 - 15    Comment: Performed at [facility], [place], [place] [address]  Troponin I (High Sensitivity)     Status: Abnormal   Collection Time: 07/11/21  6:44 AM  Result Value Ref Range   Troponin I (High Sensitivity) 28 (H) <18 ng/L    Comment: (NOTE) Elevated high sensitivity troponin I (hsTnI) values and significant  changes across serial measurements may suggest ACS but many other  chronic and acute conditions are known to elevate hsTnI results.  Refer to the "Links" section for chest pain algorithms and additional  guidance. Performed at [facility], [address] [place], [place] [address]   Lactic acid, plasma     Status: Abnormal   Collection Time: 07/11/21  6:44 AM  Result Value Ref Range   Lactic Acid, Venous 5.9 (HH) 0.5 - 1.9 mmol/L    Comment: CRITICAL RESULT CALLED TO, READ BACK BY AND VERIFIED WITH EMILY GANNON AT 0809 07/11/21 DAS Performed at [facility], [address] [place], [place] [address]   Brain natriuretic peptide     Status: None   Collection Time: 07/11/21  6:45 AM  Result Value Ref Range   B Natriuretic Peptide 84.2 0.0 - 100.0 pg/mL    Comment: Performed at [facility], [address] [place], [place] [address]  Magnesium     Status: None   Collection Time: 07/11/21  6:45 AM  Result Value Ref Range   Magnesium 2.3 1.7 - 2.4 mg/dL    Comment: Performed at [facility], [address] [place], [place] [address]  Phosphorus     Status:  None   Collection Time: 07/11/21  6:45 AM  Result Value Ref Range   Phosphorus 2.5 2.5 - 4.6 mg/dL    Comment: Performed at [facility], [address] [place], [place] [address]  Procalcitonin - Baseline     Status: None   Collection Time: 07/11/21  6:45 AM  Result Value Ref Range   Procalcitonin 0.12 ng/mL    Comment:        Interpretation: PCT (Procalcitonin) <= 0.5 ng/mL: Systemic infection (sepsis) is not likely. Local bacterial infection is possible. (NOTE)       Sepsis PCT Algorithm           Lower Respiratory Tract                                      Infection PCT Algorithm    ----------------------------     ----------------------------         PCT < 0.25 ng/mL                PCT < 0.10 ng/mL          Strongly encourage             Strongly discourage   discontinuation of antibiotics    initiation of antibiotics    ----------------------------     -----------------------------       PCT 0.25 - 0.50 ng/mL            PCT 0.10 - 0.25 ng/mL               OR       >80% decrease in PCT            Discourage initiation of                                            antibiotics      Encourage discontinuation           of antibiotics    ----------------------------     -----------------------------         PCT >= 0.50 ng/mL              PCT 0.26 - 0.50 ng/mL               AND        <80% decrease in PCT             Encourage initiation of                                             antibiotics       Encourage continuation           of antibiotics    ----------------------------     -----------------------------        PCT >= 0.50 ng/mL                  PCT > 0.50 ng/mL               AND         increase in PCT  Strongly encourage                                      initiation of antibiotics    Strongly encourage escalation           of antibiotics                                     -----------------------------                                            PCT <= 0.25 ng/mL                                                 OR                                        > 80% decrease in PCT                                      Discontinue / Do not initiate                                             antibiotics  Performed at [facility], [address] [place], [place] [address]   CULTURE, BLOOD (ROUTINE X 2) w Reflex to ID Panel     Status: None   Collection Time: 07/11/21  6:52 AM   Specimen: BLOOD  Result Value Ref Range   Specimen Description BLOOD RIGHT ARM    Special Requests      BOTTLES DRAWN AEROBIC AND ANAEROBIC Blood Culture adequate volume   Culture      NO GROWTH 5 DAYS Performed at [facility], [address] [place], [place] [address]    Report Status 07/16/2021 FINAL   Glucose, capillary     Status: Abnormal   Collection Time: 07/11/21  7:23 AM  Result Value Ref Range   Glucose-Capillary 368 (H) 70 - 99 mg/dL    Comment: Glucose reference range applies only to samples taken after fasting for at least 8 hours.  Lactic acid, plasma     Status: Abnormal   Collection Time: 07/11/21  9:07 AM  Result Value Ref Range   Lactic Acid, Venous 4.9 (HH) 0.5 - 1.9 mmol/L    Comment: CRITICAL VALUE NOTED. VALUE IS CONSISTENT WITH PREVIOUSLY REPORTED/CALLED VALUE DAS Performed at [facility], [place], [place] [address]   Troponin I (High Sensitivity)     Status: Abnormal   Collection Time: 07/11/21  9:07 AM  Result Value Ref Range   Troponin I (High Sensitivity) 33 (H) <18 ng/L    Comment: (NOTE) Elevated high sensitivity troponin I (hsTnI) values and significant  changes across serial measurements may suggest ACS but many  other  chronic and acute conditions are known to elevate hsTnI results.  Refer to the "Links" section for chest pain algorithms and additional  guidance. Performed at [facility], [address] [place], [place] [address]   Glucose,  capillary     Status: Abnormal   Collection Time: 07/11/21 11:27 AM  Result Value Ref Range   Glucose-Capillary 221 (H) 70 - 99 mg/dL    Comment: Glucose reference range applies only to samples taken after fasting for at least 8 hours.  Culture, Respiratory w Gram Stain     Status: None   Collection Time: 07/11/21 12:23 PM   Specimen: Tracheal Aspirate; Respiratory  Result Value Ref Range   Specimen Description      TRACHEAL ASPIRATE Performed at [facility], [address] [place], [place] [address]    Special Requests      NONE Performed at [facility], [place], [place] [address]    Gram Stain      FEW WBC PRESENT, PREDOMINANTLY PMN NO ORGANISMS SEEN Performed at [facility], [address], [place], [place] [address]    Culture RARE PSEUDOMONAS AERUGINOSA    Report Status 07/14/2021 FINAL    Organism ID, Bacteria PSEUDOMONAS AERUGINOSA       Susceptibility   Pseudomonas aeruginosa - MIC*    CEFTAZIDIME 4 SENSITIVE Sensitive     CIPROFLOXACIN <=0.25 SENSITIVE Sensitive     GENTAMICIN <=1 SENSITIVE Sensitive     IMIPENEM 1 SENSITIVE Sensitive     PIP/TAZO 8 SENSITIVE Sensitive     CEFEPIME 2 SENSITIVE Sensitive     * RARE PSEUDOMONAS AERUGINOSA  Respiratory (~20 pathogens) panel by PCR     Status: None   Collection Time: 07/11/21 12:23 PM   Specimen: Nasopharyngeal Swab; Respiratory  Result Value Ref Range   Adenovirus NOT DETECTED NOT DETECTED   Coronavirus 229E NOT DETECTED NOT DETECTED    Comment: (NOTE) The Coronavirus on the Respiratory Panel, DOES NOT test for the novel  Coronavirus (2019 nCoV)    Coronavirus HKU1 NOT DETECTED NOT DETECTED   Coronavirus NL63 NOT DETECTED NOT DETECTED   Coronavirus OC43 NOT DETECTED NOT DETECTED   Metapneumovirus NOT DETECTED NOT DETECTED   Rhinovirus / Enterovirus NOT DETECTED NOT DETECTED   Influenza A NOT DETECTED NOT DETECTED   Influenza B NOT DETECTED NOT DETECTED    Parainfluenza Virus 1 NOT DETECTED NOT DETECTED   Parainfluenza Virus 2 NOT DETECTED NOT DETECTED   Parainfluenza Virus 3 NOT DETECTED NOT DETECTED   Parainfluenza Virus 4 NOT DETECTED NOT DETECTED   Respiratory Syncytial Virus NOT DETECTED NOT DETECTED   Bordetella pertussis NOT DETECTED NOT DETECTED   Bordetella Parapertussis NOT DETECTED NOT DETECTED   Chlamydophila pneumoniae NOT DETECTED NOT DETECTED   Mycoplasma pneumoniae NOT DETECTED NOT DETECTED    Comment: Performed at [facility], [place] [address], [place], [place] [address]  CULTURE, BLOOD (ROUTINE X 2) w Reflex to ID Panel     Status: None   Collection Time: 07/11/21  1:44 PM   Specimen: BLOOD  Result Value Ref Range   Specimen Description BLOOD RIGHT ANTECUBITAL    Special Requests      BOTTLES DRAWN AEROBIC AND ANAEROBIC Blood Culture adequate volume   Culture      NO GROWTH 5 DAYS Performed at [facility], [address] [place], [place] [address]    Report Status 07/16/2021 FINAL   Hepatitis A  antibody, IgM     Status: None   Collection Time: 07/11/21  1:45 PM  Result Value Ref Range   Hep A IgM NON REACTIVE NON REACTIVE    Comment: Performed at [facility], [place] [address] [place], [place] [address]  CULTURE, BLOOD (ROUTINE X 2) w Reflex to ID Panel     Status: None   Collection Time: 07/11/21  1:49 PM   Specimen: BLOOD  Result Value Ref Range   Specimen Description BLOOD BLOOD LEFT HAND    Special Requests      BOTTLES DRAWN AEROBIC AND ANAEROBIC Blood Culture adequate volume   Culture      NO GROWTH 5 DAYS Performed at [facility], [address] [place], [place] [address]    Report Status 07/16/2021 FINAL   Glucose, capillary     Status: None   Collection Time: 07/11/21  3:52 PM  Result Value Ref Range   Glucose-Capillary 90 70 - 99 mg/dL    Comment: Glucose reference range applies only to samples taken after fasting for at least 8 hours.  Glucose, capillary      Status: Abnormal   Collection Time: 07/11/21  7:33 PM  Result Value Ref Range   Glucose-Capillary 117 (H) 70 - 99 mg/dL    Comment: Glucose reference range applies only to samples taken after fasting for at least 8 hours.   Comment 1 Notify RN    Comment 2 Document in Chart   Glucose, capillary     Status: Abnormal   Collection Time: 07/11/21 11:36 PM  Result Value Ref Range   Glucose-Capillary 138 (H) 70 - 99 mg/dL    Comment: Glucose reference range applies only to samples taken after fasting for at least 8 hours.  Procalcitonin     Status: None   Collection Time: 07/12/21  3:13 AM  Result Value Ref Range   Procalcitonin 0.53 ng/mL    Comment:        Interpretation: PCT > 0.5 ng/mL and <= 2 ng/mL: Systemic infection (sepsis) is possible, but other conditions are known to elevate PCT as well. (NOTE)       Sepsis PCT Algorithm           Lower Respiratory Tract                                      Infection PCT Algorithm    ----------------------------     ----------------------------         PCT < 0.25 ng/mL                PCT < 0.10 ng/mL          Strongly encourage             Strongly discourage   discontinuation of antibiotics    initiation of antibiotics    ----------------------------     -----------------------------       PCT 0.25 - 0.50 ng/mL            PCT 0.10 - 0.25 ng/mL               OR       >80% decrease in PCT            Discourage initiation of  antibiotics      Encourage discontinuation           of antibiotics    ----------------------------     -----------------------------         PCT >= 0.50 ng/mL              PCT 0.26 - 0.50 ng/mL                AND       <80% decrease in PCT             Encourage initiation of                                             antibiotics       Encourage continuation           of antibiotics    ----------------------------     -----------------------------        PCT >= 0.50  ng/mL                  PCT > 0.50 ng/mL               AND         increase in PCT                  Strongly encourage                                      initiation of antibiotics    Strongly encourage escalation           of antibiotics                                     -----------------------------                                           PCT <= 0.25 ng/mL                                                 OR                                        > 80% decrease in PCT                                      Discontinue / Do not initiate                                             antibiotics  Performed at [facility], [address], [place], [place] [address]   Basic metabolic panel     Status: Abnormal   Collection  Time: 07/12/21  3:13 AM  Result Value Ref Range   Sodium 142 135 - 145 mmol/L   Potassium 3.7 3.5 - 5.1 mmol/L   Chloride 107 98 - 111 mmol/L   CO2 31 22 - 32 mmol/L   Glucose, Bld 98 70 - 99 mg/dL    Comment: Glucose reference range applies only to samples taken after fasting for at least 8 hours.   BUN 16 8 - 23 mg/dL   Creatinine, Ser 0.42 (L) 0.44 - 1.00 mg/dL   Calcium 8.0 (L) 8.9 - 10.3 mg/dL   GFR, Estimated >60 >60 mL/min    Comment: (NOTE) Calculated using the CKD-EPI Creatinine Equation (2021)    Anion gap 4 (L) 5 - 15    Comment: Performed at [facility], [address] [place], [place] [address]  Magnesium     Status: None   Collection Time: 07/12/21  3:13 AM  Result Value Ref Range   Magnesium 2.3 1.7 - 2.4 mg/dL    Comment: Performed at [facility], [address] [place], [place] [address]  Phosphorus     Status: None   Collection Time: 07/12/21  3:13 AM  Result Value Ref Range   Phosphorus 3.5 2.5 - 4.6 mg/dL    Comment: Performed at [facility], [address] [place], [place] [address]  CBC     Status: Abnormal   Collection Time: 07/12/21  3:13 AM  Result Value Ref Range   WBC 11.2 (H) 4.0 -  10.5 K/uL   RBC 2.93 (L) 3.87 - 5.11 MIL/uL   Hemoglobin 8.4 (L) 12.0 - 15.0 g/dL   HCT 27.8 (L) 36.0 - 46.0 %   MCV 94.9 80.0 - 100.0 fL   MCH 28.7 26.0 - 34.0 pg   MCHC 30.2 30.0 - 36.0 g/dL   RDW 14.4 11.5 - 15.5 %   Platelets 176 150 - 400 K/uL   nRBC 0.0 0.0 - 0.2 %    Comment: Performed at [facility], [address] [place], [place] [address]  Glucose, capillary     Status: None   Collection Time: 07/12/21  4:19 AM  Result Value Ref Range   Glucose-Capillary 85 70 - 99 mg/dL    Comment: Glucose reference range applies only to samples taken after fasting for at least 8 hours.  Glucose, capillary     Status: Abnormal   Collection Time: 07/12/21  7:43 AM  Result Value Ref Range   Glucose-Capillary 62 (L) 70 - 99 mg/dL    Comment: Glucose reference range applies only to samples taken after fasting for at least 8 hours.  Glucose, capillary     Status: Abnormal   Collection Time: 07/12/21 11:07 AM  Result Value Ref Range   Glucose-Capillary 100 (H) 70 - 99 mg/dL    Comment: Glucose reference range applies only to samples taken after fasting for at least 8 hours.  Glucose, capillary     Status: Abnormal   Collection Time: 07/12/21  3:57 PM  Result Value Ref Range   Glucose-Capillary 254 (H) 70 - 99 mg/dL    Comment: Glucose reference range applies only to samples taken after fasting for at least 8 hours.  Glucose, capillary     Status: None   Collection Time: 07/12/21  7:36 PM  Result Value Ref Range   Glucose-Capillary 95 70 - 99 mg/dL    Comment: Glucose reference range applies only to samples taken after fasting for at least 8  hours.  Glucose, capillary     Status: Abnormal   Collection Time: 07/12/21  9:32 PM  Result Value Ref Range   Glucose-Capillary 208 (H) 70 - 99 mg/dL    Comment: Glucose reference range applies only to samples taken after fasting for at least 8 hours.   Comment 1 Notify RN    Comment 2 Document in Chart   Glucose, capillary     Status:  Abnormal   Collection Time: 07/12/21 11:59 PM  Result Value Ref Range   Glucose-Capillary 228 (H) 70 - 99 mg/dL    Comment: Glucose reference range applies only to samples taken after fasting for at least 8 hours.   Comment 1 Notify RN    Comment 2 Document in Chart   Glucose, capillary     Status: Abnormal   Collection Time: 07/13/21  3:24 AM  Result Value Ref Range   Glucose-Capillary 116 (H) 70 - 99 mg/dL    Comment: Glucose reference range applies only to samples taken after fasting for at least 8 hours.   Comment 1 Notify RN    Comment 2 Document in Chart   Procalcitonin     Status: None   Collection Time: 07/13/21  5:06 AM  Result Value Ref Range   Procalcitonin 0.24 ng/mL    Comment:        Interpretation: PCT (Procalcitonin) <= 0.5 ng/mL: Systemic infection (sepsis) is not likely. Local bacterial infection is possible. (NOTE)       Sepsis PCT Algorithm           Lower Respiratory Tract                                      Infection PCT Algorithm    ----------------------------     ----------------------------         PCT < 0.25 ng/mL                PCT < 0.10 ng/mL          Strongly encourage             Strongly discourage   discontinuation of antibiotics    initiation of antibiotics    ----------------------------     -----------------------------       PCT 0.25 - 0.50 ng/mL            PCT 0.10 - 0.25 ng/mL               OR       >80% decrease in PCT            Discourage initiation of                                            antibiotics      Encourage discontinuation           of antibiotics    ----------------------------     -----------------------------         PCT >= 0.50 ng/mL              PCT 0.26 - 0.50 ng/mL               AND        <80% decrease in PCT  Encourage initiation of                                             antibiotics       Encourage continuation           of antibiotics    ----------------------------      -----------------------------        PCT >= 0.50 ng/mL                  PCT > 0.50 ng/mL               AND         increase in PCT                  Strongly encourage                                      initiation of antibiotics    Strongly encourage escalation           of antibiotics                                     -----------------------------                                           PCT <= 0.25 ng/mL                                                 OR                                        > 80% decrease in PCT                                      Discontinue / Do not initiate                                             antibiotics  Performed at [facility], [address] [place], [place] [address]   Basic metabolic panel     Status: Abnormal   Collection Time: 07/13/21  5:06 AM  Result Value Ref Range   Sodium 140 135 - 145 mmol/L   Potassium 3.3 (L) 3.5 - 5.1 mmol/L   Chloride 101 98 - 111 mmol/L   CO2 35 (H) 22 - 32 mmol/L   Glucose, Bld 81 70 - 99 mg/dL    Comment: Glucose reference range applies only to samples taken after fasting for at least 8 hours.   BUN 16 8 - 23 mg/dL   Creatinine, Ser 0.45 0.44 - 1.00 mg/dL   Calcium 8.2 (L) 8.9 - 10.3  mg/dL   GFR, Estimated >60 >60 mL/min    Comment: (NOTE) Calculated using the CKD-EPI Creatinine Equation (2021)    Anion gap 4 (L) 5 - 15    Comment: Performed at [facility], [address] [place], [place] [address]  Magnesium     Status: None   Collection Time: 07/13/21  5:06 AM  Result Value Ref Range   Magnesium 2.4 1.7 - 2.4 mg/dL    Comment: Performed at [facility], [address] [place], [place] [address]  Phosphorus     Status: None   Collection Time: 07/13/21  5:06 AM  Result Value Ref Range   Phosphorus 4.1 2.5 - 4.6 mg/dL    Comment: Performed at [facility], [address] [place], [place] [address]  CBC     Status: Abnormal   Collection Time: 07/13/21   5:06 AM  Result Value Ref Range   WBC 7.6 4.0 - 10.5 K/uL   RBC 3.17 (L) 3.87 - 5.11 MIL/uL   Hemoglobin 9.2 (L) 12.0 - 15.0 g/dL   HCT 29.9 (L) 36.0 - 46.0 %   MCV 94.3 80.0 - 100.0 fL   MCH 29.0 26.0 - 34.0 pg   MCHC 30.8 30.0 - 36.0 g/dL   RDW 14.0 11.5 - 15.5 %   Platelets 185 150 - 400 K/uL   nRBC 0.0 0.0 - 0.2 %    Comment: Performed at [facility], [address] [place], [place] [address]  Glucose, capillary     Status: Abnormal   Collection Time: 07/13/21  7:37 AM  Result Value Ref Range   Glucose-Capillary 67 (L) 70 - 99 mg/dL    Comment: Glucose reference range applies only to samples taken after fasting for at least 8 hours.  Glucose, capillary     Status: Abnormal   Collection Time: 07/13/21  8:38 AM  Result Value Ref Range   Glucose-Capillary 135 (H) 70 - 99 mg/dL    Comment: Glucose reference range applies only to samples taken after fasting for at least 8 hours.  Glucose, capillary     Status: Abnormal   Collection Time: 07/13/21 11:24 AM  Result Value Ref Range   Glucose-Capillary 167 (H) 70 - 99 mg/dL    Comment: Glucose reference range applies only to samples taken after fasting for at least 8 hours.  Glucose, capillary     Status: Abnormal   Collection Time: 07/13/21  3:48 PM  Result Value Ref Range   Glucose-Capillary 175 (H) 70 - 99 mg/dL    Comment: Glucose reference range applies only to samples taken after fasting for at least 8 hours.  Glucose, capillary     Status: Abnormal   Collection Time: 07/13/21  7:59 PM  Result Value Ref Range   Glucose-Capillary 190 (H) 70 - 99 mg/dL    Comment: Glucose reference range applies only to samples taken after fasting for at least 8 hours.   Comment 1 Notify RN    Comment 2 Document in Chart   Glucose, capillary     Status: Abnormal   Collection Time: 07/14/21 12:00 AM  Result Value Ref Range   Glucose-Capillary 220 (H) 70 - 99 mg/dL    Comment: Glucose reference range applies only to samples taken  after fasting for at least 8 hours.   Comment 1 Notify RN    Comment 2 Document in Chart   Glucose, capillary     Status: Abnormal   Collection Time: 07/14/21  4:16 AM  Result Value Ref Range   Glucose-Capillary 53 (L) 70 - 99 mg/dL    Comment: Glucose reference range applies only to samples taken after fasting for at least 8 hours.   Comment 1 Notify RN    Comment 2 Document in Chart   Glucose, capillary     Status: Abnormal   Collection Time: 07/14/21  5:09 AM  Result Value Ref Range   Glucose-Capillary 224 (H) 70 - 99 mg/dL    Comment: Glucose reference range applies only to samples taken after fasting for at least 8 hours.  Basic metabolic panel     Status: Abnormal   Collection Time: 07/14/21  5:13 AM  Result Value Ref Range   Sodium 139 135 - 145 mmol/L   Potassium 3.3 (L) 3.5 - 5.1 mmol/L   Chloride 101 98 - 111 mmol/L   CO2 32 22 - 32 mmol/L   Glucose, Bld 223 (H) 70 - 99 mg/dL    Comment: Glucose reference range applies only to samples taken after fasting for at least 8 hours.   BUN 19 8 - 23 mg/dL   Creatinine, Ser 0.54 0.44 - 1.00 mg/dL   Calcium 8.3 (L) 8.9 - 10.3 mg/dL   GFR, Estimated >60 >60 mL/min    Comment: (NOTE) Calculated using the CKD-EPI Creatinine Equation (2021)    Anion gap 6 5 - 15    Comment: Performed at [facility], [address] [place], [place] [address]  Magnesium     Status: None   Collection Time: 07/14/21  5:13 AM  Result Value Ref Range   Magnesium 2.3 1.7 - 2.4 mg/dL    Comment: Performed at [facility], [address] [place], [place] [address]  Phosphorus     Status: None   Collection Time: 07/14/21  5:13 AM  Result Value Ref Range   Phosphorus 4.6 2.5 - 4.6 mg/dL    Comment: Performed at [facility], [address] [place], [place] [address]  CBC     Status: Abnormal   Collection Time: 07/14/21  5:13 AM  Result Value Ref Range   WBC 6.8 4.0 - 10.5 K/uL   RBC 3.31 (L) 3.87 - 5.11 MIL/uL    Hemoglobin 9.6 (L) 12.0 - 15.0 g/dL   HCT 31.0 (L) 36.0 - 46.0 %   MCV 93.7 80.0 - 100.0 fL   MCH 29.0 26.0 - 34.0 pg   MCHC 31.0 30.0 - 36.0 g/dL   RDW 13.7 11.5 - 15.5 %   Platelets 181 150 - 400 K/uL   nRBC 0.0 0.0 - 0.2 %    Comment: Performed at [facility], [address] [place], [place] [address]  Glucose, capillary     Status: Abnormal   Collection Time: 07/14/21  7:16 AM  Result Value Ref Range   Glucose-Capillary 136 (H) 70 - 99 mg/dL    Comment: Glucose reference range applies only to samples taken after fasting for at least 8 hours.  Glucose, capillary     Status: Abnormal   Collection Time: 07/14/21 11:17 AM  Result Value Ref Range   Glucose-Capillary 127 (H) 70 - 99 mg/dL    Comment: Glucose reference range applies only to samples taken after fasting for at least 8 hours.  Potassium     Status: Abnormal   Collection Time: 07/14/21  2:28 PM  Result Value Ref Range   Potassium 3.3 (L) 3.5 - 5.1 mmol/L    Comment: Performed  at [facility], [address] [place], [place] [address]  Glucose, capillary     Status: Abnormal   Collection Time: 07/14/21  3:32 PM  Result Value Ref Range   Glucose-Capillary 173 (H) 70 - 99 mg/dL    Comment: Glucose reference range applies only to samples taken after fasting for at least 8 hours.  Glucose, capillary     Status: Abnormal   Collection Time: 07/14/21  7:59 PM  Result Value Ref Range   Glucose-Capillary 146 (H) 70 - 99 mg/dL    Comment: Glucose reference range applies only to samples taken after fasting for at least 8 hours.  Glucose, capillary     Status: Abnormal   Collection Time: 07/14/21 11:20 PM  Result Value Ref Range   Glucose-Capillary 254 (H) 70 - 99 mg/dL    Comment: Glucose reference range applies only to samples taken after fasting for at least 8 hours.  Basic metabolic panel     Status: Abnormal   Collection Time: 07/15/21  2:58 AM  Result Value Ref Range   Sodium 135 135 - 145 mmol/L    Potassium 3.4 (L) 3.5 - 5.1 mmol/L   Chloride 101 98 - 111 mmol/L   CO2 30 22 - 32 mmol/L   Glucose, Bld 244 (H) 70 - 99 mg/dL    Comment: Glucose reference range applies only to samples taken after fasting for at least 8 hours.   BUN 21 8 - 23 mg/dL   Creatinine, Ser 0.48 0.44 - 1.00 mg/dL   Calcium 8.4 (L) 8.9 - 10.3 mg/dL   GFR, Estimated >60 >60 mL/min    Comment: (NOTE) Calculated using the CKD-EPI Creatinine Equation (2021)    Anion gap 4 (L) 5 - 15    Comment: Performed at [facility], [address], [place], [place] [address]  Magnesium     Status: None   Collection Time: 07/15/21  2:58 AM  Result Value Ref Range   Magnesium 2.2 1.7 - 2.4 mg/dL    Comment: Performed at [facility], [address] [place], [place] [address]  Phosphorus     Status: None   Collection Time: 07/15/21  2:58 AM  Result Value Ref Range   Phosphorus 4.5 2.5 - 4.6 mg/dL    Comment: Performed at [facility], [address] [place], [place] [address]  Glucose, capillary     Status: Abnormal   Collection Time: 07/15/21  5:33 AM  Result Value Ref Range   Glucose-Capillary 134 (H) 70 - 99 mg/dL    Comment: Glucose reference range applies only to samples taken after fasting for at least 8 hours.  Glucose, capillary     Status: Abnormal   Collection Time: 07/15/21  7:23 AM  Result Value Ref Range   Glucose-Capillary 135 (H) 70 - 99 mg/dL    Comment: Glucose reference range applies only to samples taken after fasting for at least 8 hours.  Glucose, capillary     Status: Abnormal   Collection Time: 07/15/21 11:33 AM  Result Value Ref Range   Glucose-Capillary 204 (H) 70 - 99 mg/dL    Comment: Glucose reference range applies only to samples taken after fasting for at least 8 hours.  Pulmonary function test     Status: None   Collection Time: 07/29/21 11:49 AM  Result Value Ref Range   FEV1     FVC     FEV1/FVC     TLC  DLCO    UA/M w/rflx Culture, Routine      Status: Abnormal   Collection Time: 07/30/21  2:07 PM   Specimen: Urine   Urine  Result Value Ref Range   Specific Gravity, UA 1.018 1.005 - 1.030   pH, UA 6.5 5.0 - 7.5   Color, UA Yellow Yellow   Appearance Ur Clear Clear   Leukocytes,UA 1+ (A) Negative   Protein,UA Negative Negative/Trace   Glucose, UA Negative Negative   Ketones, UA Negative Negative   RBC, UA Negative Negative   Bilirubin, UA Negative Negative   Urobilinogen, Ur 0.2 0.2 - 1.0 mg/dL   Nitrite, UA Negative Negative   Microscopic Examination See below:     Comment: Microscopic was indicated and was performed.   Urinalysis Reflex Comment     Comment: This specimen has reflexed to a Urine Culture.  Microscopic Examination     Status: None   Collection Time: 07/30/21  2:07 PM   Urine  Result Value Ref Range   WBC, UA 0-5 0 - 5 /hpf   RBC None seen 0 - 2 /hpf   Epithelial Cells (non renal) 0-10 0 - 10 /hpf   Casts None seen None seen /lpf   Bacteria, UA None seen None seen/Few  Urine Culture, Reflex     Status: None   Collection Time: 07/30/21  2:07 PM   Urine  Result Value Ref Range   Urine Culture, Routine Final report    Organism ID, Bacteria Comment     Comment: Greater than 2 organisms recovered, none predominant. Please submit another sample if clinically indicated. 25,000-50,000 colony forming units per mL   Glucose, capillary     Status: Abnormal   Collection Time: 08/14/21 12:42 PM  Result Value Ref Range   Glucose-Capillary 111 (H) 70 - 99 mg/dL    Comment: Glucose reference range applies only to samples taken after fasting for at least 8 hours.    Radiology: NM PET Image Restag (PS) Skull Base To Thigh  Result Date: 08/15/2021 CLINICAL DATA:  Subsequent treatment strategy for lung cancer. Enlarging pulmonary nodules. EXAM: NUCLEAR MEDICINE PET SKULL BASE TO THIGH TECHNIQUE: 6.54 mCi F-18 FDG was injected intravenously. Full-ring PET imaging was performed from the skull base to thigh after  the radiotracer. CT data was obtained and used for attenuation correction and anatomic localization. Fasting blood glucose: 111 mg/dl COMPARISON:  Chest CT 07/11/2021.  Prior PET-CT 03/19/2020 FINDINGS: Mediastinal blood pool activity: SUV max 1.90 Liver activity: SUV max NA NECK: No hypermetabolic lymph nodes in the neck. Incidental CT findings: none CHEST: Stable bandlike scarring changes in the right upper lobe. Mild hypermetabolism in this area with SUV max 1.88 below that of mediastinal background. This appears stable when compared to the prior PET-CT. No findings suspicious for recurrent tumor. Stable appearing nodularity in the right lower lobe without FDG uptake. Findings consistent with scarring change. No PET-CT findings suspicious for new or recurrent tumor pulmonary metastatic disease. No enlarged or hypermetabolic mediastinal or hilar lymph nodes. Incidental CT findings: Significant artifact related to a recent lead less pacemaker insertion. Stable aortic and coronary artery calcifications. Stable surgical changes involving the right chest wall from a previous thoracotomy. ABDOMEN/PELVIS: No abnormal hypermetabolic activity within the liver, pancreas, adrenal glands, or spleen. No hypermetabolic lymph nodes in the abdomen or pelvis. Incidental CT findings: Stable atherosclerotic calcifications involving the aorta and iliac arteries. Stable cystic lesion in the subcutaneous fat posteriorly just above the iliac crests, likely  benign sebaceous cyst. SKELETON: No focal hypermetabolic activity to suggest skeletal metastasis. Incidental CT findings: none IMPRESSION: 1. Stable scarring type changes involving the right lung. No hypermetabolic pulmonary lesions to suggest recurrent or metastatic tumor. 2. No findings for metastatic disease involving the abdomen/pelvis or bony structures. Electronically Signed   By: Marijo Sanes M.D.   On: 08/15/2021 13:15    No results found.  NM PET Image Restag (PS)  Skull Base To Thigh  Result Date: 08/15/2021 CLINICAL DATA:  Subsequent treatment strategy for lung cancer. Enlarging pulmonary nodules. EXAM: NUCLEAR MEDICINE PET SKULL BASE TO THIGH TECHNIQUE: 6.54 mCi F-18 FDG was injected intravenously. Full-ring PET imaging was performed from the skull base to thigh after the radiotracer. CT data was obtained and used for attenuation correction and anatomic localization. Fasting blood glucose: 111 mg/dl COMPARISON:  Chest CT 07/11/2021.  Prior PET-CT 03/19/2020 FINDINGS: Mediastinal blood pool activity: SUV max 1.90 Liver activity: SUV max NA NECK: No hypermetabolic lymph nodes in the neck. Incidental CT findings: none CHEST: Stable bandlike scarring changes in the right upper lobe. Mild hypermetabolism in this area with SUV max 1.88 below that of mediastinal background. This appears stable when compared to the prior PET-CT. No findings suspicious for recurrent tumor. Stable appearing nodularity in the right lower lobe without FDG uptake. Findings consistent with scarring change. No PET-CT findings suspicious for new or recurrent tumor pulmonary metastatic disease. No enlarged or hypermetabolic mediastinal or hilar lymph nodes. Incidental CT findings: Significant artifact related to a recent lead less pacemaker insertion. Stable aortic and coronary artery calcifications. Stable surgical changes involving the right chest wall from a previous thoracotomy. ABDOMEN/PELVIS: No abnormal hypermetabolic activity within the liver, pancreas, adrenal glands, or spleen. No hypermetabolic lymph nodes in the abdomen or pelvis. Incidental CT findings: Stable atherosclerotic calcifications involving the aorta and iliac arteries. Stable cystic lesion in the subcutaneous fat posteriorly just above the iliac crests, likely benign sebaceous cyst. SKELETON: No focal hypermetabolic activity to suggest skeletal metastasis. Incidental CT findings: none IMPRESSION: 1. Stable scarring type changes  involving the right lung. No hypermetabolic pulmonary lesions to suggest recurrent or metastatic tumor. 2. No findings for metastatic disease involving the abdomen/pelvis or bony structures. Electronically Signed   By: Marijo Sanes M.D.   On: 08/15/2021 13:15      Assessment and Plan: Patient Active Problem List   Diagnosis Date Noted   Symptomatic bradycardia 07/09/2021   Chronic respiratory failure with hypoxia (HCC)    CAP (community acquired pneumonia) 06/26/2021   COPD exacerbation ([place]) 06/11/2021   SOB (shortness of breath) 05/01/2021   HCAP (healthcare-associated pneumonia) 05/01/2021   Chronic combined systolic and diastolic CHF (congestive heart failure) ([place]) 05/01/2021   Acute exacerbation of chronic obstructive pulmonary disease (COPD) ([place]) 04/30/2021   Elevated troponin 04/01/2021   Acute on chronic respiratory failure with hypoxia ([place]) 03/31/2021   Pneumothorax after biopsy 05/03/2020   Pneumothorax on right 05/01/2020   Chest pain on breathing 04/21/2020   Pleuritic chest pain 04/11/2020   Right lower lobe pulmonary nodule 04/02/2020   Iron deficiency anemia 04/02/2020   Pica in adults 04/02/2020   Goals of care, counseling/discussion 03/14/2020   Pain in left wrist 09/24/2019   Encounter for general adult medical examination with abnormal findings 06/28/2019   Elevated total protein 01/22/2019   Uncontrolled type 2 diabetes mellitus with hyperglycemia ([place]) 05/16/2018   Chronic obstructive pulmonary disease ([place]) 05/16/2018   Vitamin D deficiency 05/16/2018   Flu vaccine need 05/16/2018  Dysuria 05/16/2018   Cancer of lower lobe of right lung ([place]) 01/21/2018   Screening for breast cancer 10/12/2017   Personal history of tobacco use, presenting hazards to health 02/03/2017   PAD (peripheral artery disease) ([place]) 06/22/2016   Essential hypertension 05/26/2016   Diabetes ([place]) 05/26/2016   Hyperlipidemia 05/26/2016   Atherosclerosis of native arteries of  extremity with intermittent claudication ([place]) 05/26/2016   Uterine leiomyoma 04/30/2016   Elevated CEA 01/31/2016   Special screening for malignant neoplasms, colon    Benign neoplasm of sigmoid colon    Primary lung cancer ([place])    Malignant neoplasm of upper lobe of right lung ([place])     1. Chronic respiratory failure with hypoxia (HCC) We will continue with oxygen therapy as recommended she has been compliant with the recommended therapy.  Continue on the current flow rates appears to be adequate  2. Cancer of lower lobe of right lung [facility]) She had a PET scan and CT done shows stability. She is following with oncology  3. Chronic obstructive pulmonary disease, unspecified COPD type ([place]) Continue with oxygen therapy inhalers as prescribed.  Follow-up on pulmonary functions as warranted.  General Counseling: I have discussed the findings of the evaluation and examination with [facility].  I have also discussed any further diagnostic evaluation thatmay be needed or ordered today. Payzlee verbalizes understanding of the findings of todays visit. We also reviewed her medications today and discussed drug interactions and side effects including but not limited excessive drowsiness and altered mental states. We also discussed that there is always a risk not just to her but also people around her. she has been encouraged to call the office with any questions or concerns that should arise related to todays visit.  No orders of the defined types were placed in this encounter.    Time spent: 27  I have personally obtained a history, examined the patient, evaluated laboratory and imaging results, formulated the assessment and plan and placed orders.    Allyne Gee, MD [facility]monary [facility] Sleep medicine

## 2021-08-26 NOTE — Patient Instructions (Signed)

## 2021-09-09 DIAGNOSIS — J449 Chronic obstructive pulmonary disease, unspecified: Secondary | ICD-10-CM | POA: Diagnosis not present

## 2021-09-11 ENCOUNTER — Encounter: Payer: Self-pay | Admitting: Nurse Practitioner

## 2021-09-11 ENCOUNTER — Other Ambulatory Visit: Payer: Self-pay

## 2021-09-11 ENCOUNTER — Ambulatory Visit (INDEPENDENT_AMBULATORY_CARE_PROVIDER_SITE_OTHER): Payer: Medicare Other | Admitting: Nurse Practitioner

## 2021-09-11 VITALS — BP 132/80 | HR 97 | Temp 98.4°F | Resp 16 | Ht 59.0 in | Wt 125.2 lb

## 2021-09-11 DIAGNOSIS — I10 Essential (primary) hypertension: Secondary | ICD-10-CM | POA: Diagnosis not present

## 2021-09-11 DIAGNOSIS — M25511 Pain in right shoulder: Secondary | ICD-10-CM

## 2021-09-11 DIAGNOSIS — J449 Chronic obstructive pulmonary disease, unspecified: Secondary | ICD-10-CM | POA: Diagnosis not present

## 2021-09-11 DIAGNOSIS — E1165 Type 2 diabetes mellitus with hyperglycemia: Secondary | ICD-10-CM | POA: Diagnosis not present

## 2021-09-11 LAB — POCT GLYCOSYLATED HEMOGLOBIN (HGB A1C): Hemoglobin A1C: 7.4 % — AB (ref 4.0–5.6)

## 2021-09-11 MED ORDER — ACCU-CHEK GUIDE VI STRP: ORAL_STRIP | 3 refills | Status: DC

## 2021-09-11 MED ORDER — METHOCARBAMOL 500 MG PO TABS: 500.0000 mg | ORAL_TABLET | Freq: Three times a day (TID) | ORAL | 2 refills | Status: DC | PRN

## 2021-09-11 MED ORDER — TRAMADOL HCL 50 MG PO TABS: 100.0000 mg | ORAL_TABLET | Freq: Four times a day (QID) | ORAL | 0 refills | Status: DC | PRN

## 2021-09-11 MED ORDER — METFORMIN HCL 500 MG PO TABS: 1000.0000 mg | ORAL_TABLET | Freq: Every day | ORAL | 1 refills | Status: DC

## 2021-09-11 NOTE — Progress Notes (Signed)
[facility] [place], [place] [address]  Internal MEDICINE  Office Visit Note  Patient Name: Angela Jensen  [id number]  [id number]  Date of Service: 09/11/2021  Chief Complaint  Patient presents with   Follow-up    Right shoulder pain all the way to hand   Diabetes   Depression   Hyperlipidemia   Hypertension   Anemia   COPD   Asthma    HPI Angela Jensen presents for a follow up visit for diabetes, hypertension and right shoulder pain. Her mammogram and dexa scan is scheduled for 2/14. Her A1C is 7.4 today which is significantly elevated compared to 6.6 in October. Her blood pressure is stable with current medications.  For diabetes, she is currently taking metformin 500 mg twice daily and levemir 8-10 units daily.  She has been having pain in her right shoulder that radiates down her arm all the way to her hand. The pain is sharp and constant but severity of the pain can fluctuate.    Current Medication: Outpatient Encounter Medications as of 09/11/2021  Medication Sig   Accu-Chek Softclix Lancets lancets Use as instructed for twice a daily Dx E11.65   albuterol (PROVENTIL) (2.5 MG/3ML) 0.083% nebulizer solution USE 1 VIAL IN NEBULIZER EVERY 6 HOURS AS NEEDED FOR WHEEZING   albuterol (VENTOLIN HFA) 108 (90 Base) MCG/ACT inhaler INHALE 2 PUFFS BY MOUTH EVERY 6 HOURS AS NEEDED FOR WHEEZING OR SHORTNESS OF BREATH   ALPRAZolam (XANAX) 0.25 MG tablet Take 1 tablet (0.25 mg total) by mouth 3 (three) times daily as needed for anxiety or sleep.   Blood Glucose Monitoring Suppl (ACCU-CHEK GUIDE) w/Device KIT Use as directed Dx e11.65   clopidogrel (PLAVIX) 75 MG tablet Take 1 tablet by mouth once daily   escitalopram (LEXAPRO) 10 MG tablet Take 1 tablet (10 mg total) by mouth daily.   ferrous sulfate 325 (65 FE) MG tablet Take 325 mg by mouth 2 (two) times daily with a meal.   fluticasone (FLONASE) 50 MCG/ACT nasal spray Place 2 sprays into both nostrils daily.    gabapentin (NEURONTIN) 100 MG capsule Take 1 capsule (100 mg total) by mouth 2 (two) times daily.   insulin detemir (LEVEMIR FLEXTOUCH) 100 UNIT/ML FlexPen Inject 8- 12 units with supper qd// with needles   lisinopril (ZESTRIL) 10 MG tablet Take 1 tablet (10 mg total) by mouth daily.   methocarbamol (ROBAXIN) 500 MG tablet Take 1 tablet (500 mg total) by mouth every 8 (eight) hours as needed for muscle spasms.   metoprolol tartrate (LOPRESSOR) 25 MG tablet Take 0.5 tablets (12.5 mg total) by mouth 2 (two) times daily.   mometasone-formoterol (DULERA) 200-5 MCG/ACT AERO Inhale 2 puffs into the lungs 2 (two) times daily.   Omega-3 Fatty Acids (FISH OIL) 1000 MG CAPS Take 1 capsule by mouth daily.   OXYGEN Inhale into the lungs.   potassium chloride (KLOR-CON) 10 MEQ tablet TAKE 1 TABLET BY MOUTH EVERY OTHER DAY   tiotropium (SPIRIVA HANDIHALER) 18 MCG inhalation capsule INHALE THE CONTENTS OF ONE CAPSULE BY MOUTH ONCE DAILY. DO NOT SWALLOW CAPSULE.   [DISCONTINUED] glucose blood (ACCU-CHEK GUIDE) test strip USE  STRIP TO CHECK GLUCOSE TWICE DAILY   [DISCONTINUED] metFORMIN (GLUCOPHAGE) 500 MG tablet Take 1 tablet (500 mg total) by mouth daily with breakfast.   [DISCONTINUED] traMADol (ULTRAM) 50 MG tablet Take 1 tablet (50 mg total) by mouth every 6 (six) hours as needed for moderate pain or severe pain.   famotidine (  PEPCID) 20 MG tablet Take 1 tablet (20 mg total) by mouth daily for 14 days.   glucose blood (ACCU-CHEK GUIDE) test strip USE  STRIP TO CHECK GLUCOSE first thing in the morning, before meals and at bedtime (check glucose level a total of 5 times daily).   metFORMIN (GLUCOPHAGE) 500 MG tablet Take 2 tablets (1,000 mg total) by mouth daily with breakfast.   traMADol (ULTRAM) 50 MG tablet Take 2 tablets (100 mg total) by mouth every 6 (six) hours as needed for moderate pain or severe pain. No more than 8 tablets per 24 hours   No facility-administered encounter medications on file as of  09/11/2021.    Surgical History: Past Surgical History:  Procedure Laterality Date   CESAREAN SECTION     x3   COLONOSCOPY WITH PROPOFOL N/A 06/25/2015   Procedure: COLONOSCOPY WITH PROPOFOL;  Surgeon: Lucilla Lame, MD;  Location: [facility];  Service: Endoscopy;  Laterality: N/A;   COLONOSCOPY WITH PROPOFOL N/A 07/26/2020   Procedure: COLONOSCOPY WITH PROPOFOL;  Surgeon: Lucilla Lame, MD;  Location: [place];  Service: Endoscopy;  Laterality: N/A;   CYST REMOVAL LEG     and on shoulder    ESOPHAGOGASTRODUODENOSCOPY (EGD) WITH PROPOFOL N/A 07/26/2020   Procedure: ESOPHAGOGASTRODUODENOSCOPY (EGD) WITH PROPOFOL;  Surgeon: Lucilla Lame, MD;  Location: [place];  Service: Endoscopy;  Laterality: N/A;  Diabetic - oral meds   LOWER EXTREMITY ANGIOGRAPHY Left 09/29/2018   Procedure: LOWER EXTREMITY ANGIOGRAPHY;  Surgeon: Algernon Huxley, MD;  Location: [place] [facility];  Service: Cardiovascular;  Laterality: Left;   LUNG BIOPSY  12/30/2011   has lung "spots"   PACEMAKER IMPLANT  07/14/2021   PACEMAKER LEADLESS INSERTION N/A 07/14/2021   Procedure: PACEMAKER LEADLESS INSERTION;  Surgeon: Isaias Cowman, MD;  Location: [place] [facility];  Service: Cardiovascular;  Laterality: N/A;   PERIPHERAL VASCULAR CATHETERIZATION Left 06/01/2016   Procedure: Lower Extremity Angiography;  Surgeon: Algernon Huxley, MD;  Location: [place] [facility];  Service: Cardiovascular;  Laterality: Left;   PERIPHERAL VASCULAR CATHETERIZATION N/A 06/01/2016   Procedure: Abdominal Aortogram w/Lower Extremity;  Surgeon: Algernon Huxley, MD;  Location: [place] [facility];  Service: Cardiovascular;  Laterality: N/A;   PERIPHERAL VASCULAR CATHETERIZATION  06/01/2016   Procedure: Lower Extremity Intervention;  Surgeon: Algernon Huxley, MD;  Location: [place] [facility];  Service: Cardiovascular;;   PERIPHERAL VASCULAR CATHETERIZATION Right 06/08/2016   Procedure: Lower Extremity Angiography;   Surgeon: Algernon Huxley, MD;  Location: [place] [facility];  Service: Cardiovascular;  Laterality: Right;   PERIPHERAL VASCULAR CATHETERIZATION  06/08/2016   Procedure: Lower Extremity Intervention;  Surgeon: Algernon Huxley, MD;  Location: [place] [facility];  Service: Cardiovascular;;   SUBMANDIBULAR GLAND EXCISION Left 12/06/2020   Procedure: EXCISION SUBMANDIBULAR GLAND;  Surgeon: Beverly Gust, MD;  Location: [place];  Service: ENT;  Laterality: Left;  needs to be first case Diabetic - diet controlled   TEMPORARY PACEMAKER N/A 07/11/2021   Procedure: TEMPORARY PACEMAKER;  Surgeon: Isaias Cowman, MD;  Location: [place] [facility];  Service: Cardiovascular;  Laterality: N/A;    Medical History: Past Medical History:  Diagnosis Date   Anemia    Arthritis    Asthma    Atherosclerosis of native arteries of extremity with intermittent claudication ([place]) 05/26/2016   Cancer ([place]) 2012   Right Lung CA   COPD (chronic obstructive pulmonary disease) (HCC)    Depression    Diabetes mellitus  without complication [facility])    Patient takes Janumet   Essential hypertension 05/26/2016   Heart failure ([place]) 2022   Hypercholesteremia    Oxygen dependent    2L at nite    PAD (peripheral artery disease) ([place]) 06/22/2016   Peripheral vascular disease ([place])    Personal history of radiation therapy    Shortness of breath dyspnea    with exertion    Sialolithiasis    Sleep apnea    Wears dentures    full upper and lower    Family History: Family History  Problem Relation Age of Onset   Lung cancer Father    Diabetes Mother    Hypercholesterolemia Mother    Diabetes Sister    Diabetes Maternal Grandmother    Diabetes Paternal Grandmother    Diabetes Sister    Heart attack Brother    Coronary artery disease Brother    Vascular Disease Brother    Hypertension Sister    Heart attack Brother     Social History   Socioeconomic History   Marital status: Widowed     Spouse name: Not on file   Number of children: Not on file   Years of education: Not on file   Highest education level: Not on file  Occupational History   Not on file  Tobacco Use   Smoking status: Former    Packs/day: 1.00    Years: 37.00    Pack years: 37.00    Types: Cigarettes    Quit date: 02/06/2010    Years since quitting: 11.6   Smokeless tobacco: Former    Types: Snuff  Vaping Use   Vaping Use: Never used  Substance and Sexual Activity   Alcohol use: Not Currently    Alcohol/week: 5.0 standard drinks    Types: 5 Cans of beer per week    Comment: /h x of alcohol abuse -stopped 2012- now drinks 5 beer per week   Drug use: Not Currently    Types: Marijuana, "Crack" cocaine, Cocaine    Comment: hx of cocaine use- last use 2015; last use marijuana6/22/19,   Sexual activity: Yes  Other Topics Concern   Not on file  Social History Narrative   Lives with Significant Other x 43 years   Social Determinants of Health   Financial Resource Strain: Not on file  Food Insecurity: Not on file  Transportation Needs: Not on file  Physical Activity: Not on file  Stress: Not on file  Social Connections: Not on file  Intimate Partner Violence: Not on file      Review of Systems  Constitutional:  Negative for chills, fatigue and unexpected weight change.  HENT:  Negative for congestion, rhinorrhea, sneezing and sore throat.   Eyes:  Negative for redness.  Respiratory:  Negative for cough, chest tightness and shortness of breath.   Cardiovascular:  Negative for chest pain and palpitations.  Gastrointestinal:  Negative for abdominal pain, constipation, diarrhea, nausea and vomiting.  Genitourinary:  Negative for dysuria and frequency.  Musculoskeletal:  Negative for arthralgias, back pain, joint swelling and neck pain.  Skin:  Negative for rash.  Neurological: Negative.  Negative for tremors and numbness.  Hematological:  Negative for adenopathy. Does not bruise/bleed easily.   Psychiatric/Behavioral:  Negative for behavioral problems (Depression), sleep disturbance and suicidal ideas. The patient is not nervous/anxious.    Vital Signs: BP 132/80    Pulse 97    Temp 98.4 F (36.9 C)    Resp 16  Ht '4\' 11"'  (1.499 m)    Wt 125 lb 3.2 oz (56.8 kg)    SpO2 96% Comment: with 2L oxygen   BMI 25.29 kg/m    Physical Exam Vitals reviewed.  Constitutional:      General: She is not in acute distress.    Appearance: Normal appearance. She is normal weight. She is not ill-appearing.  HENT:     Head: Normocephalic and atraumatic.  Eyes:     Pupils: Pupils are equal, round, and reactive to light.  Cardiovascular:     Rate and Rhythm: Normal rate and regular rhythm.  Pulmonary:     Effort: Pulmonary effort is normal. No respiratory distress.  Neurological:     Mental Status: She is alert and oriented to person, place, and time.     Cranial Nerves: No cranial nerve deficit.     Coordination: Coordination normal.     Gait: Gait normal.  Psychiatric:        Mood and Affect: Mood normal.        Behavior: Behavior normal.       Assessment/Plan: 1. Uncontrolled type 2 diabetes mellitus with hyperglycemia (HCC) A1C is significantly elevated at 7.4 today. Metformin increased to 1000 mg twice daily with meals. Test strips refilled.  - POCT glycosylated hemoglobin (Hb A1C) - glucose blood (ACCU-CHEK GUIDE) test strip; USE  STRIP TO CHECK GLUCOSE first thing in the morning, before meals and at bedtime (check glucose level a total of 5 times daily).  Dispense: 500 each; Refill: 3 - metFORMIN (GLUCOPHAGE) 500 MG tablet; Take 2 tablets (1,000 mg total) by mouth daily with breakfast.  Dispense: 180 tablet; Refill: 1  2. Acute pain of right shoulder Referred to orthopedic for right shoulder pain. Tramadol prescribed to alleviate severe pain. Muscle relaxant prescribed to help with musculoskeletal pain and spasms.  - Ambulatory referral to Orthopedic Surgery - methocarbamol  (ROBAXIN) 500 MG tablet; Take 1 tablet (500 mg total) by mouth every 8 (eight) hours as needed for muscle spasms.  Dispense: 30 tablet; Refill: 2 - traMADol (ULTRAM) 50 MG tablet; Take 2 tablets (100 mg total) by mouth every 6 (six) hours as needed for moderate pain or severe pain. No more than 8 tablets per 24 hours  Dispense: 30 tablet; Refill: 0  3. Essential hypertension Stable, continue medications as prescribed.   4. Chronic obstructive pulmonary disease, unspecified COPD type ([place]) Stable. Her breathing has been better since switching back to her previous maintenance inhalers and discontinuing trelegy.    General Counseling: Angela Jensen verbalizes understanding of the findings of todays visit and agrees with plan of treatment. I have discussed any further diagnostic evaluation that may be needed or ordered today. We also reviewed her medications today. she has been encouraged to call the office with any questions or concerns that should arise related to todays visit.    Orders Placed This Encounter  Procedures   Ambulatory referral to Orthopedic Surgery   POCT glycosylated hemoglobin (Hb A1C)    Meds ordered this encounter  Medications   glucose blood (ACCU-CHEK GUIDE) test strip    Sig: USE  STRIP TO CHECK GLUCOSE first thing in the morning, before meals and at bedtime (check glucose level a total of 5 times daily).    Dispense:  500 each    Refill:  3    Order adjusted, patient checked glucose 5 times daily. E11.65   metFORMIN (GLUCOPHAGE) 500 MG tablet    Sig: Take 2 tablets (1,000 mg  total) by mouth daily with breakfast.    Dispense:  180 tablet    Refill:  1    Do not fill until pt requests   methocarbamol (ROBAXIN) 500 MG tablet    Sig: Take 1 tablet (500 mg total) by mouth every 8 (eight) hours as needed for muscle spasms.    Dispense:  30 tablet    Refill:  2   traMADol (ULTRAM) 50 MG tablet    Sig: Take 2 tablets (100 mg total) by mouth every 6 (six) hours as needed for  moderate pain or severe pain. No more than 8 tablets per 24 hours    Dispense:  30 tablet    Refill:  0    Return for F/U, Recheck A1C, Chanta Bauers PCP.   Total time spent:30 Minutes Time spent includes review of chart, medications, test results, and follow up plan with the patient.   [place] Controlled Substance Database was reviewed by me.  This patient was seen by Jonetta Osgood, FNP-C in collaboration with Dr. Clayborn Bigness as a part of collaborative care agreement.   Cheyene Hamric R. Valetta Fuller, MSN, FNP-C Internal medicine

## 2021-09-15 ENCOUNTER — Telehealth: Payer: Self-pay

## 2021-09-15 NOTE — Telephone Encounter (Signed)
Awaiting 09/11/21 office notes for ortho surgery referral-Toni

## 2021-09-18 DIAGNOSIS — J449 Chronic obstructive pulmonary disease, unspecified: Secondary | ICD-10-CM | POA: Diagnosis not present

## 2021-09-29 NOTE — Telephone Encounter (Signed)
Referral sent urgent via [facility] to [facility]

## 2021-09-29 NOTE — Telephone Encounter (Signed)
Appointment> 10/03/2021 @ 2:00-Toni

## 2021-09-30 ENCOUNTER — Ambulatory Visit
Admission: RE | Admit: 2021-09-30 | Discharge: 2021-09-30 | Disposition: A | Discharge status: Home / Self Care | Payer: Medicare Other | Source: Ambulatory Visit | Attending: Nurse Practitioner | Admitting: Nurse Practitioner

## 2021-09-30 ENCOUNTER — Other Ambulatory Visit: Payer: Self-pay

## 2021-09-30 DIAGNOSIS — M85851 Other specified disorders of bone density and structure, right thigh: Secondary | ICD-10-CM | POA: Diagnosis not present

## 2021-09-30 DIAGNOSIS — Z1231 Encounter for screening mammogram for malignant neoplasm of breast: Secondary | ICD-10-CM | POA: Diagnosis not present

## 2021-09-30 DIAGNOSIS — Z780 Asymptomatic menopausal state: Secondary | ICD-10-CM | POA: Diagnosis not present

## 2021-10-03 DIAGNOSIS — M7541 Impingement syndrome of right shoulder: Secondary | ICD-10-CM | POA: Diagnosis not present

## 2021-10-05 ENCOUNTER — Encounter: Payer: Self-pay | Admitting: Nurse Practitioner

## 2021-10-06 ENCOUNTER — Telehealth: Payer: Self-pay

## 2021-10-06 ENCOUNTER — Other Ambulatory Visit: Payer: Self-pay

## 2021-10-06 ENCOUNTER — Other Ambulatory Visit: Payer: Self-pay | Admitting: Nurse Practitioner

## 2021-10-06 DIAGNOSIS — F419 Anxiety disorder, unspecified: Secondary | ICD-10-CM

## 2021-10-06 DIAGNOSIS — I5020 Unspecified systolic (congestive) heart failure: Secondary | ICD-10-CM | POA: Diagnosis not present

## 2021-10-06 DIAGNOSIS — Z950 Presence of cardiac pacemaker: Secondary | ICD-10-CM | POA: Diagnosis not present

## 2021-10-06 DIAGNOSIS — I700 Atherosclerosis of aorta: Secondary | ICD-10-CM | POA: Diagnosis not present

## 2021-10-06 DIAGNOSIS — R519 Headache, unspecified: Secondary | ICD-10-CM | POA: Diagnosis not present

## 2021-10-06 DIAGNOSIS — I739 Peripheral vascular disease, unspecified: Secondary | ICD-10-CM | POA: Diagnosis not present

## 2021-10-06 DIAGNOSIS — E119 Type 2 diabetes mellitus without complications: Secondary | ICD-10-CM | POA: Diagnosis not present

## 2021-10-06 DIAGNOSIS — J9621 Acute and chronic respiratory failure with hypoxia: Secondary | ICD-10-CM | POA: Diagnosis not present

## 2021-10-06 DIAGNOSIS — I10 Essential (primary) hypertension: Secondary | ICD-10-CM | POA: Diagnosis not present

## 2021-10-06 DIAGNOSIS — G4739 Other sleep apnea: Secondary | ICD-10-CM | POA: Diagnosis not present

## 2021-10-06 DIAGNOSIS — I442 Atrioventricular block, complete: Secondary | ICD-10-CM | POA: Diagnosis not present

## 2021-10-06 DIAGNOSIS — E782 Mixed hyperlipidemia: Secondary | ICD-10-CM | POA: Diagnosis not present

## 2021-10-06 DIAGNOSIS — J449 Chronic obstructive pulmonary disease, unspecified: Secondary | ICD-10-CM | POA: Diagnosis not present

## 2021-10-06 MED ORDER — LISINOPRIL 10 MG PO TABS: 10.0000 mg | ORAL_TABLET | Freq: Every day | ORAL | 2 refills | Status: DC

## 2021-10-06 MED ORDER — ALPRAZOLAM 0.25 MG PO TABS: 0.2500 mg | ORAL_TABLET | Freq: Three times a day (TID) | ORAL | 0 refills | Status: DC | PRN

## 2021-10-07 NOTE — Telephone Encounter (Signed)
Send med to [facility]

## 2021-10-10 DIAGNOSIS — J449 Chronic obstructive pulmonary disease, unspecified: Secondary | ICD-10-CM | POA: Diagnosis not present

## 2021-10-16 DIAGNOSIS — J449 Chronic obstructive pulmonary disease, unspecified: Secondary | ICD-10-CM | POA: Diagnosis not present

## 2021-10-23 ENCOUNTER — Other Ambulatory Visit: Payer: Self-pay | Admitting: Nurse Practitioner

## 2021-10-23 DIAGNOSIS — E1165 Type 2 diabetes mellitus with hyperglycemia: Secondary | ICD-10-CM

## 2021-11-07 DIAGNOSIS — J449 Chronic obstructive pulmonary disease, unspecified: Secondary | ICD-10-CM | POA: Diagnosis not present

## 2021-11-10 ENCOUNTER — Other Ambulatory Visit: Payer: Self-pay | Admitting: Nurse Practitioner

## 2021-11-10 DIAGNOSIS — F419 Anxiety disorder, unspecified: Secondary | ICD-10-CM

## 2021-11-16 DIAGNOSIS — J449 Chronic obstructive pulmonary disease, unspecified: Secondary | ICD-10-CM | POA: Diagnosis not present

## 2021-11-24 ENCOUNTER — Other Ambulatory Visit: Payer: Self-pay | Admitting: Internal Medicine

## 2021-11-24 DIAGNOSIS — J449 Chronic obstructive pulmonary disease, unspecified: Secondary | ICD-10-CM

## 2021-11-25 ENCOUNTER — Ambulatory Visit (INDEPENDENT_AMBULATORY_CARE_PROVIDER_SITE_OTHER): Payer: Medicare Other

## 2021-11-25 ENCOUNTER — Ambulatory Visit (INDEPENDENT_AMBULATORY_CARE_PROVIDER_SITE_OTHER): Payer: Medicare Other | Admitting: Nurse Practitioner

## 2021-11-25 VITALS — BP 190/99 | HR 91 | Resp 18 | Ht 59.0 in | Wt 124.0 lb

## 2021-11-25 DIAGNOSIS — Z794 Long term (current) use of insulin: Secondary | ICD-10-CM | POA: Diagnosis not present

## 2021-11-25 DIAGNOSIS — I70213 Atherosclerosis of native arteries of extremities with intermittent claudication, bilateral legs: Secondary | ICD-10-CM

## 2021-11-25 DIAGNOSIS — E1165 Type 2 diabetes mellitus with hyperglycemia: Secondary | ICD-10-CM | POA: Diagnosis not present

## 2021-11-25 DIAGNOSIS — I10 Essential (primary) hypertension: Secondary | ICD-10-CM | POA: Diagnosis not present

## 2021-11-25 DIAGNOSIS — E785 Hyperlipidemia, unspecified: Secondary | ICD-10-CM | POA: Diagnosis not present

## 2021-11-27 ENCOUNTER — Telehealth: Payer: Self-pay

## 2021-11-27 ENCOUNTER — Other Ambulatory Visit: Payer: Self-pay | Admitting: Nurse Practitioner

## 2021-11-27 DIAGNOSIS — I10 Essential (primary) hypertension: Secondary | ICD-10-CM

## 2021-11-27 MED ORDER — LISINOPRIL 10 MG PO TABS: 20.0000 mg | ORAL_TABLET | Freq: Every day | ORAL | 0 refills | Status: DC

## 2021-11-27 NOTE — Telephone Encounter (Signed)
Spoke with pt and advised her take to tab for lisinopril 10 mg and keep bp log and call us back in few days with reading and also keep follow up on 12/11/21 ?

## 2021-11-28 ENCOUNTER — Other Ambulatory Visit: Payer: Self-pay | Admitting: Nurse Practitioner

## 2021-12-01 DIAGNOSIS — R0602 Shortness of breath: Secondary | ICD-10-CM | POA: Diagnosis not present

## 2021-12-02 ENCOUNTER — Ambulatory Visit: Payer: Medicare Other | Admitting: Internal Medicine

## 2021-12-07 ENCOUNTER — Encounter (INDEPENDENT_AMBULATORY_CARE_PROVIDER_SITE_OTHER): Payer: Self-pay | Admitting: Nurse Practitioner

## 2021-12-07 NOTE — Progress Notes (Signed)
? ?Subjective:  ? ? [name], female    DOB: 09-13-1955, 66 y.o.   MRN: [id number] ?Chief Complaint  ?Patient presents with  ? Follow-up  ?  ultrasound  ? ? ?The patient returns to the office for followup and review of the noninvasive studies.  ? ?There have been no interval changes in lower extremity symptoms. No interval shortening of the patient's claudication distance or development of rest pain symptoms. No new ulcers or wounds have occurred since the last visit. ? ?There have been no significant changes to the patient's overall health care. ? ?The patient denies amaurosis fugax or recent TIA symptoms. There are no documented recent neurological changes noted. ?There is no history of DVT, PE or superficial thrombophlebitis. ?The patient denies recent episodes of angina or shortness of breath.  ? ?ABI Rt=0.90 and Lt=0.66  (previous ABI's Rt=0.84 and Lt=0.69) ?Duplex ultrasound of the tibial arteries reveals monophasic waveforms.  Toe waveforms on all digits are normal they are nearly normal on the left with minimally dampened on the left fifth digit. ? ? ?Review of Systems  ?All other systems reviewed and are negative. ? ?   ?Objective:  ? Physical Exam ?Vitals reviewed.  ?HENT:  ?   Head: Normocephalic.  ?Cardiovascular:  ?   Rate and Rhythm: Normal rate.  ?   Pulses:     ?     Dorsalis pedis pulses are detected w/ Doppler on the right side and detected w/ Doppler on the left side.  ?     Posterior tibial pulses are detected w/ Doppler on the right side and detected w/ Doppler on the left side.  ?Pulmonary:  ?   Effort: Pulmonary effort is normal.  ?Skin: ?   General: Skin is warm and dry.  ?Neurological:  ?   Mental Status: She is alert and oriented to person, place, and time.  ?Psychiatric:     ?   Mood and Affect: Mood normal.     ?   Behavior: Behavior normal.     ?   Thought Content: Thought content normal.     ?   Judgment: Judgment normal.  ? ? ?BP (!) 190/99 (BP Location: Right Arm)    Pulse 91   Resp 18   Ht 4\' 11"  (1.499 m)   Wt 124 lb (56.2 kg)   BMI 25.04 kg/m?  ? ?Past Medical History:  ?Diagnosis Date  ? Anemia   ? Arthritis   ? Asthma   ? Atherosclerosis of native arteries of extremity with intermittent claudication ([place]) 05/26/2016  ? Cancer [facility]) 2012  ? Right Lung CA  ? COPD (chronic obstructive pulmonary disease) ([place])   ? Depression   ? Diabetes mellitus without complication ([place])   ? Patient takes [place]  ? Essential hypertension 05/26/2016  ? Heart failure ([place]) 2022  ? Hypercholesteremia   ? Oxygen dependent   ? 2L at nite   ? PAD (peripheral artery disease) ([place]) 06/22/2016  ? Peripheral vascular disease ([place])   ? Personal history of radiation therapy   ? Shortness of breath dyspnea   ? with exertion   ? Sialolithiasis   ? Sleep apnea   ? Wears dentures   ? full upper and lower  ? ? ?Social History  ? ?Socioeconomic History  ? Marital status: Widowed  ?  Spouse name: Not on file  ? Number of children: Not on file  ? Years of education: Not on file  ?  Highest education level: Not on file  ?Occupational History  ? Not on file  ?Tobacco Use  ? Smoking status: Former  ?  Packs/day: 1.00  ?  Years: 37.00  ?  Pack years: 37.00  ?  Types: Cigarettes  ?  Quit date: 02/06/2010  ?  Years since quitting: 11.8  ? Smokeless tobacco: Former  ?  Types: Snuff  ?Vaping Use  ? Vaping Use: Never used  ?Substance and Sexual Activity  ? Alcohol use: Not Currently  ?  Alcohol/week: 5.0 standard drinks  ?  Types: 5 Cans of beer per week  ?  Comment: /h x of alcohol abuse -stopped 2012- now drinks 5 beer per week  ? Drug use: Not Currently  ?  Types: Marijuana, "Crack" cocaine, Cocaine  ?  Comment: hx of cocaine use- last use 2015; last use marijuana6/22/19,  ? Sexual activity: Yes  ?Other Topics Concern  ? Not on file  ?Social History Narrative  ? Lives with Significant Other x 43 years  ? ?Social Determinants of Health  ? ?Financial Resource Strain: Not on file  ?Food Insecurity: Not on file   ?Transportation Needs: Not on file  ?Physical Activity: Not on file  ?Stress: Not on file  ?Social Connections: Not on file  ?Intimate Partner Violence: Not on file  ? ? ?Past Surgical History:  ?Procedure Laterality Date  ? CESAREAN SECTION    ? x3  ? COLONOSCOPY WITH PROPOFOL N/A 06/25/2015  ? Procedure: COLONOSCOPY WITH PROPOFOL;  Surgeon: Lucilla Lame, MD;  Location: [facility];  Service: Endoscopy;  Laterality: N/A;  ? COLONOSCOPY WITH PROPOFOL N/A 07/26/2020  ? Procedure: COLONOSCOPY WITH PROPOFOL;  Surgeon: Lucilla Lame, MD;  Location: [place];  Service: Endoscopy;  Laterality: N/A;  ? CYST REMOVAL LEG    ? and on shoulder   ? ESOPHAGOGASTRODUODENOSCOPY (EGD) WITH PROPOFOL N/A 07/26/2020  ? Procedure: ESOPHAGOGASTRODUODENOSCOPY (EGD) WITH PROPOFOL;  Surgeon: Lucilla Lame, MD;  Location: [place];  Service: Endoscopy;  Laterality: N/A;  Diabetic - oral meds  ? LOWER EXTREMITY ANGIOGRAPHY Left 09/29/2018  ? Procedure: LOWER EXTREMITY ANGIOGRAPHY;  Surgeon: Algernon Huxley, MD;  Location: [place] [facility];  Service: Cardiovascular;  Laterality: Left;  ? LUNG BIOPSY  12/30/2011  ? has lung "spots"  ? PACEMAKER IMPLANT  07/14/2021  ? PACEMAKER LEADLESS INSERTION N/A 07/14/2021  ? Procedure: PACEMAKER LEADLESS INSERTION;  Surgeon: Isaias Cowman, MD;  Location: [place] [facility];  Service: Cardiovascular;  Laterality: N/A;  ? PERIPHERAL VASCULAR CATHETERIZATION Left 06/01/2016  ? Procedure: Lower Extremity Angiography;  Surgeon: Algernon Huxley, MD;  Location: [place] [facility];  Service: Cardiovascular;  Laterality: Left;  ? PERIPHERAL VASCULAR CATHETERIZATION N/A 06/01/2016  ? Procedure: Abdominal Aortogram w/Lower Extremity;  Surgeon: Algernon Huxley, MD;  Location: [place] [facility];  Service: Cardiovascular;  Laterality: N/A;  ? PERIPHERAL VASCULAR CATHETERIZATION  06/01/2016  ? Procedure: Lower Extremity Intervention;  Surgeon: Algernon Huxley, MD;  Location: [place] [facility];  Service: Cardiovascular;;  ? PERIPHERAL VASCULAR CATHETERIZATION Right 06/08/2016  ? Procedure: Lower Extremity Angiography;  Surgeon: Algernon Huxley, MD;  Location: [place] [facility];  Service: Cardiovascular;  Laterality: Right;  ? PERIPHERAL VASCULAR CATHETERIZATION  06/08/2016  ? Procedure: Lower Extremity Intervention;  Surgeon: Algernon Huxley, MD;  Location: [place] [facility];  Service: Cardiovascular;;  ? SUBMANDIBULAR GLAND EXCISION Left 12/06/2020  ? Procedure: EXCISION SUBMANDIBULAR GLAND;  Surgeon: Beverly Gust, MD;  Location:  [place];  Service: ENT;  Laterality: Left;  needs to be first case ?Diabetic - diet controlled  ? TEMPORARY PACEMAKER N/A 07/11/2021  ? Procedure: TEMPORARY PACEMAKER;  Surgeon: Isaias Cowman, MD;  Location: [place] [facility];  Service: Cardiovascular;  Laterality: N/A;  ? ? ?Family History  ?Problem Relation Age of Onset  ? Lung cancer Father   ? Diabetes Mother   ? Hypercholesterolemia Mother   ? Diabetes Sister   ? Diabetes Maternal Grandmother   ? Diabetes Paternal Grandmother   ? Diabetes Sister   ? Heart attack Brother   ? Coronary artery disease Brother   ? Vascular Disease Brother   ? Hypertension Sister   ? Heart attack Brother   ? ? ?Allergies  ?Allergen Reactions  ? Trelegy Ellipta [Fluticasone-Umeclidin-Vilant]   ? ? ? ?  Latest Ref Rng & Units 07/14/2021  ?  5:13 AM 07/13/2021  ?  5:06 AM 07/12/2021  ?  3:13 AM  ?CBC  ?WBC 4.0 - 10.5 K/uL 6.8   7.6   11.2    ?Hemoglobin 12.0 - 15.0 g/dL 9.6   9.2   8.4    ?Hematocrit 36.0 - 46.0 % 31.0   29.9   27.8    ?Platelets 150 - 400 K/uL 181   185   176    ? ? ? ? ?CMP  ?   ?Component Value Date/Time  ? NA 135 07/15/2021 0258  ? NA 137 03/28/2020 1037  ? NA 141 04/17/2014 1055  ? K 3.4 (L) 07/15/2021 0258  ? K 3.9 04/17/2014 1055  ? CL 101 07/15/2021 0258  ? CL 102 04/17/2014 1055  ? CO2 30 07/15/2021 0258  ? CO2 30 04/17/2014 1055  ? GLUCOSE 244 (H) 07/15/2021 0258  ? GLUCOSE 201 (H) 04/17/2014  1055  ? BUN 21 07/15/2021 0258  ? BUN 10 03/28/2020 1037  ? BUN 10 04/17/2014 1055  ? CREATININE 0.48 07/15/2021 0258  ? CREATININE 0.90 04/17/2014 1055  ? CALCIUM 8.4 (L) 07/15/2021 0258  ? CALCIUM 9.1 04/17/2014 1055

## 2021-12-08 DIAGNOSIS — J449 Chronic obstructive pulmonary disease, unspecified: Secondary | ICD-10-CM | POA: Diagnosis not present

## 2021-12-09 IMAGING — DX DG CHEST 1V PORT
1 series · 1 of 1 positions shown · non-contrast
Comparison: 02/11/2018; CT-guided right lower lobe pulmonary nodule
biopsy-earlier same day

CLINICAL DATA: Post right lower lobe pulmonary nodule biopsy

EXAM:
PORTABLE CHEST 1 VIEW

[chest ap]
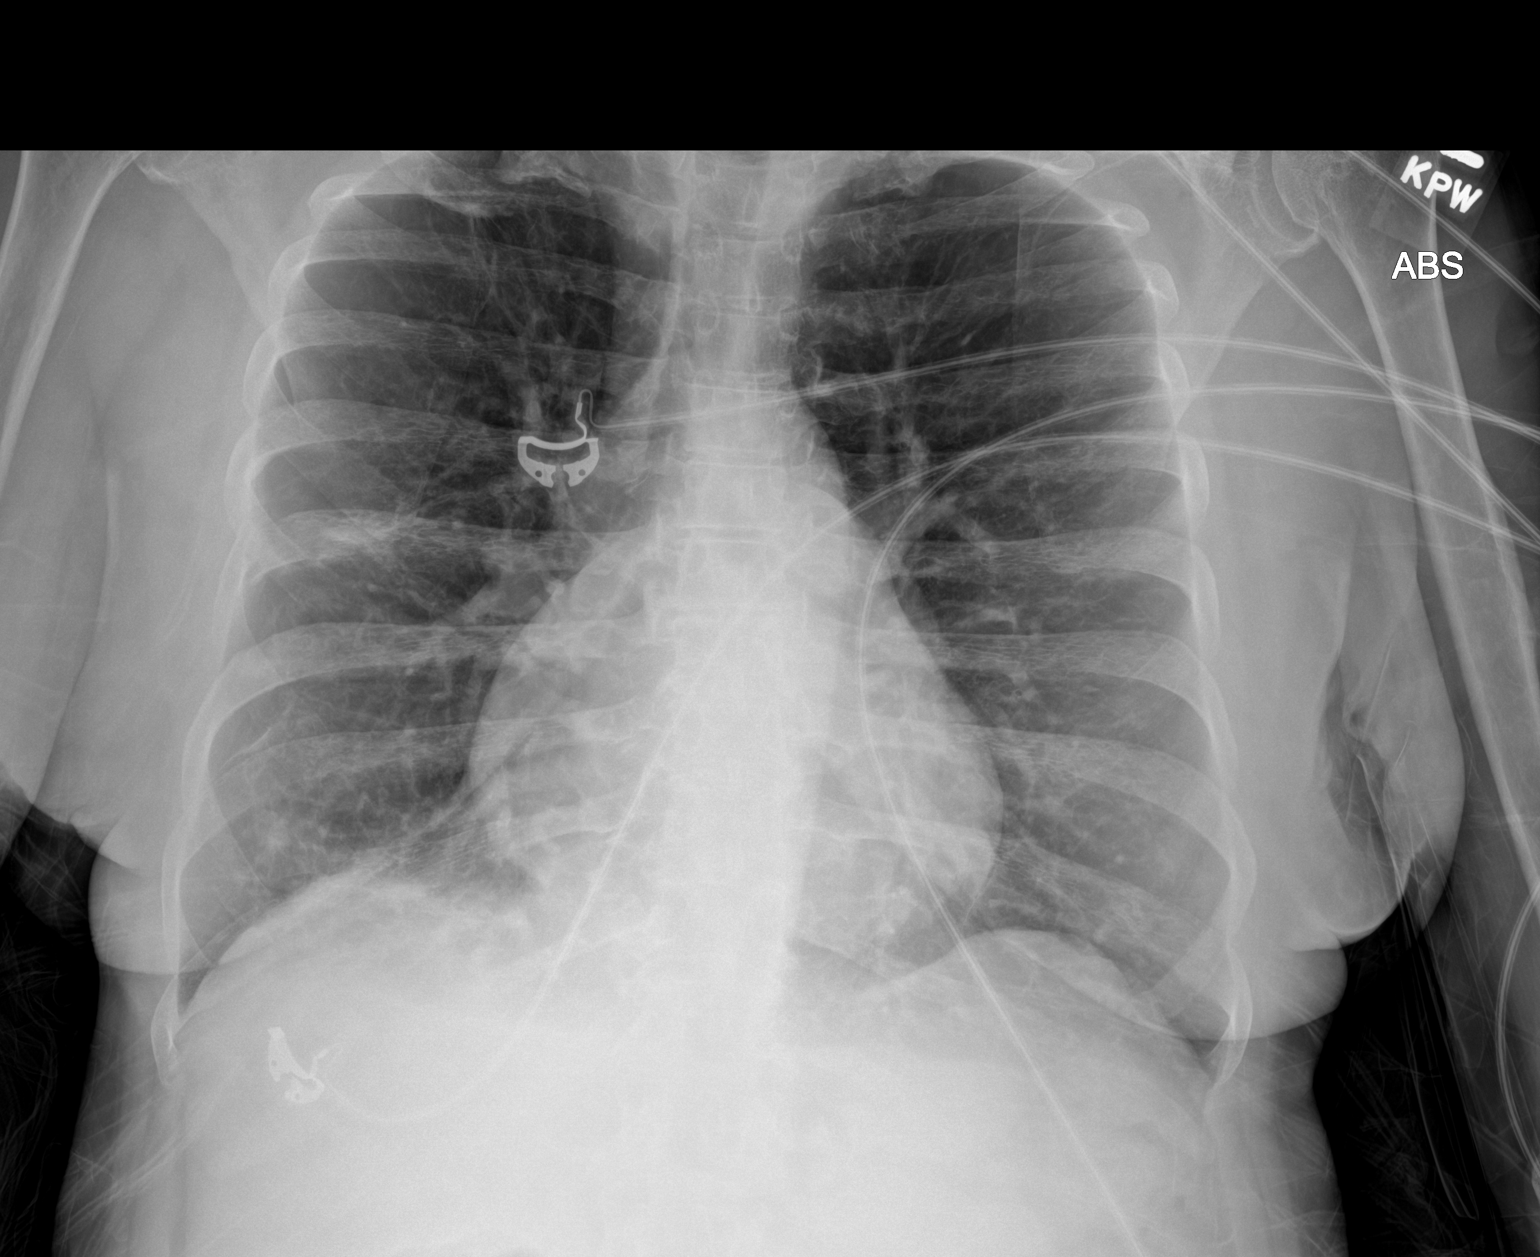

[1 of 1 positions shown; findings below may reference images not displayed]

FINDINGS: Grossly unchanged cardiac silhouette and mediastinal contours. The
lungs remain hyperexpanded with mild diffuse slightly nodular
thickening of the pulmonary interstitium. Improved aeration of the
right lower lung. Known right lower lung nodular opacities are
suboptimally evaluated on the present examination. Post treatment
change involving the right mid lung. No pleural effusion or
pneumothorax. No evidence of edema. No acute osseous abnormalities.
IMPRESSION: No evidence of complication following right lower lobe pulmonary
nodule biopsy. Specifically, no pneumothorax.

## 2021-12-11 ENCOUNTER — Ambulatory Visit (INDEPENDENT_AMBULATORY_CARE_PROVIDER_SITE_OTHER): Payer: Medicare Other | Admitting: Nurse Practitioner

## 2021-12-11 ENCOUNTER — Encounter: Payer: Self-pay | Admitting: Nurse Practitioner

## 2021-12-11 VITALS — BP 135/87 | HR 100 | Temp 98.3°F | Resp 16 | Ht 59.0 in | Wt 126.8 lb

## 2021-12-11 DIAGNOSIS — F419 Anxiety disorder, unspecified: Secondary | ICD-10-CM

## 2021-12-11 DIAGNOSIS — E1165 Type 2 diabetes mellitus with hyperglycemia: Secondary | ICD-10-CM

## 2021-12-11 DIAGNOSIS — M25511 Pain in right shoulder: Secondary | ICD-10-CM

## 2021-12-11 LAB — POCT GLYCOSYLATED HEMOGLOBIN (HGB A1C): Hemoglobin A1C: 5.9 % — AB (ref 4.0–5.6)

## 2021-12-11 MED ORDER — ESCITALOPRAM OXALATE 10 MG PO TABS: 10.0000 mg | ORAL_TABLET | Freq: Every day | ORAL | 1 refills | Status: DC

## 2021-12-11 MED ORDER — TRAMADOL HCL 50 MG PO TABS: 100.0000 mg | ORAL_TABLET | Freq: Four times a day (QID) | ORAL | 1 refills | Status: DC | PRN

## 2021-12-11 MED ORDER — CYCLOBENZAPRINE HCL 10 MG PO TABS: 10.0000 mg | ORAL_TABLET | Freq: Every day | ORAL | 1 refills | Status: DC

## 2021-12-11 NOTE — Progress Notes (Signed)
[facility] [place], [place] [address]  Internal MEDICINE  Office Visit Note  Patient Name: Angela Jensen  [id number]  [id number]  Date of Service: 12/11/2021  Chief Complaint  Patient presents with   Follow-up    Right shoulder    Depression   Diabetes   Hyperlipidemia   Hypertension   Anemia   Asthma   COPD    HPI Angela Jensen presents for follow-up visit for continued right shoulder pain, diabetes, hypertension, anxiety and depression.  She is requesting refills of medications and also stated that the 50 mg tramadol did not seem to help her pain in her shoulder. --Her hemoglobin A1c was due to be checked today and is 5.9 which is significantly improved from 7.4 in January earlier this year.     Current Medication: Outpatient Encounter Medications as of 12/11/2021  Medication Sig   Accu-Chek Softclix Lancets lancets Use as instructed for twice a daily Dx E11.65   albuterol (PROVENTIL) (2.5 MG/3ML) 0.083% nebulizer solution USE 1 VIAL IN NEBULIZER EVERY 6 HOURS AS NEEDED FOR WHEEZING   albuterol (VENTOLIN HFA) 108 (90 Base) MCG/ACT inhaler INHALE 2 PUFFS BY MOUTH EVERY 6 HOURS AS NEEDED FOR WHEEZING OR SHORTNESS OF BREATH   Blood Glucose Monitoring Suppl (ACCU-CHEK GUIDE) w/Device KIT Use as directed Dx e11.65   clopidogrel (PLAVIX) 75 MG tablet Take 1 tablet by mouth once daily   cyclobenzaprine (FLEXERIL) 10 MG tablet Take 1 tablet (10 mg total) by mouth at bedtime. Take one tab po qhs for back spasm prn only   ferrous sulfate 325 (65 FE) MG tablet Take 325 mg by mouth 2 (two) times daily with a meal.   fluticasone (FLONASE) 50 MCG/ACT nasal spray Place 2 sprays into both nostrils daily.   furosemide (LASIX) 20 MG tablet Take 20 mg by mouth daily as needed.   gabapentin (NEURONTIN) 100 MG capsule Take 1 capsule by mouth twice daily   glucose blood (ACCU-CHEK GUIDE) test strip USE  STRIP TO CHECK GLUCOSE first thing in the morning, before meals and at  bedtime (check glucose level a total of 5 times daily).   insulin detemir (LEVEMIR FLEXTOUCH) 100 UNIT/ML FlexPen Inject 8- 12 units with supper qd// with needles   lisinopril (ZESTRIL) 10 MG tablet Take 2 tablets (20 mg total) by mouth daily.   metFORMIN (GLUCOPHAGE) 500 MG tablet Take 2 tablets (1,000 mg total) by mouth daily with breakfast.   metoprolol tartrate (LOPRESSOR) 25 MG tablet Take 1/2 (one-half) tablet by mouth twice daily   mometasone-formoterol (DULERA) 200-5 MCG/ACT AERO Inhale 2 puffs into the lungs 2 (two) times daily.   Omega-3 Fatty Acids (FISH OIL) 1000 MG CAPS Take 1 capsule by mouth daily.   OXYGEN Inhale 4 L into the lungs. PT USES [employer] FOR OXYGEN   potassium chloride (KLOR-CON) 10 MEQ tablet TAKE 1 TABLET BY MOUTH EVERY OTHER DAY   [DISCONTINUED] ALPRAZolam (XANAX) 0.25 MG tablet TAKE 1 TABLET BY MOUTH THREE TIMES DAILY AS NEEDED FOR ANXIETY OR SLEEP   [DISCONTINUED] escitalopram (LEXAPRO) 10 MG tablet Take 1 tablet by mouth once daily   [DISCONTINUED] methocarbamol (ROBAXIN) 500 MG tablet Take 1 tablet (500 mg total) by mouth every 8 (eight) hours as needed for muscle spasms.   [DISCONTINUED] tiotropium (SPIRIVA HANDIHALER) 18 MCG inhalation capsule INHALE THE CONTENTS OF ONE CAPSULE BY MOUTH ONCE DAILY. DO NOT SWALLOW CAPSULE.   [DISCONTINUED] traMADol (ULTRAM) 50 MG tablet Take 2 tablets (100 mg total)  by mouth every 6 (six) hours as needed for moderate pain or severe pain. No more than 8 tablets per 24 hours   escitalopram (LEXAPRO) 10 MG tablet Take 1 tablet (10 mg total) by mouth daily.   famotidine (PEPCID) 20 MG tablet Take 1 tablet (20 mg total) by mouth daily for 14 days.   traMADol (ULTRAM) 50 MG tablet Take 2 tablets (100 mg total) by mouth every 6 (six) hours as needed for moderate pain or severe pain. No more than 8 tablets per 24 hours   No facility-administered encounter medications on file as of 12/11/2021.    Surgical History: Past Surgical  History:  Procedure Laterality Date   CESAREAN SECTION     x3   COLONOSCOPY WITH PROPOFOL N/A 06/25/2015   Procedure: COLONOSCOPY WITH PROPOFOL;  Surgeon: Lucilla Lame, MD;  Location: [facility];  Service: Endoscopy;  Laterality: N/A;   COLONOSCOPY WITH PROPOFOL N/A 07/26/2020   Procedure: COLONOSCOPY WITH PROPOFOL;  Surgeon: Lucilla Lame, MD;  Location: [place];  Service: Endoscopy;  Laterality: N/A;   CYST REMOVAL LEG     and on shoulder    ESOPHAGOGASTRODUODENOSCOPY (EGD) WITH PROPOFOL N/A 07/26/2020   Procedure: ESOPHAGOGASTRODUODENOSCOPY (EGD) WITH PROPOFOL;  Surgeon: Lucilla Lame, MD;  Location: [place];  Service: Endoscopy;  Laterality: N/A;  Diabetic - oral meds   LOWER EXTREMITY ANGIOGRAPHY Left 09/29/2018   Procedure: LOWER EXTREMITY ANGIOGRAPHY;  Surgeon: Algernon Huxley, MD;  Location: [place] [facility];  Service: Cardiovascular;  Laterality: Left;   LUNG BIOPSY  12/30/2011   has lung "spots"   PACEMAKER IMPLANT  07/14/2021   PACEMAKER LEADLESS INSERTION N/A 07/14/2021   Procedure: PACEMAKER LEADLESS INSERTION;  Surgeon: Isaias Cowman, MD;  Location: [place] [facility];  Service: Cardiovascular;  Laterality: N/A;   PERIPHERAL VASCULAR CATHETERIZATION Left 06/01/2016   Procedure: Lower Extremity Angiography;  Surgeon: Algernon Huxley, MD;  Location: [place] [facility];  Service: Cardiovascular;  Laterality: Left;   PERIPHERAL VASCULAR CATHETERIZATION N/A 06/01/2016   Procedure: Abdominal Aortogram w/Lower Extremity;  Surgeon: Algernon Huxley, MD;  Location: [place] [facility];  Service: Cardiovascular;  Laterality: N/A;   PERIPHERAL VASCULAR CATHETERIZATION  06/01/2016   Procedure: Lower Extremity Intervention;  Surgeon: Algernon Huxley, MD;  Location: [place] [facility];  Service: Cardiovascular;;   PERIPHERAL VASCULAR CATHETERIZATION Right 06/08/2016   Procedure: Lower Extremity Angiography;  Surgeon: Algernon Huxley, MD;  Location: [place] [facility];  Service: Cardiovascular;  Laterality: Right;   PERIPHERAL VASCULAR CATHETERIZATION  06/08/2016   Procedure: Lower Extremity Intervention;  Surgeon: Algernon Huxley, MD;  Location: [place] [facility];  Service: Cardiovascular;;   SUBMANDIBULAR GLAND EXCISION Left 12/06/2020   Procedure: EXCISION SUBMANDIBULAR GLAND;  Surgeon: Beverly Gust, MD;  Location: [place];  Service: ENT;  Laterality: Left;  needs to be first case Diabetic - diet controlled   TEMPORARY PACEMAKER N/A 07/11/2021   Procedure: TEMPORARY PACEMAKER;  Surgeon: Isaias Cowman, MD;  Location: [place] [facility];  Service: Cardiovascular;  Laterality: N/A;    Medical History: Past Medical History:  Diagnosis Date   Anemia    Arthritis    Asthma    Atherosclerosis of native arteries of extremity with intermittent claudication ([place]) 05/26/2016   Cancer ([place]) 2012   Right Lung CA   COPD (chronic obstructive pulmonary disease) ([place])    Depression    Diabetes mellitus without complication ([place])    Patient takes Janumet  Essential hypertension 05/26/2016   Heart failure ([place]) 2022   Hypercholesteremia    Oxygen dependent    2L at nite    PAD (peripheral artery disease) ([place]) 06/22/2016   Peripheral vascular disease (HCC)    Personal history of radiation therapy    Shortness of breath dyspnea    with exertion    Sialolithiasis    Sleep apnea    Wears dentures    full upper and lower    Family History: Family History  Problem Relation Age of Onset   Lung cancer Father    Diabetes Mother    Hypercholesterolemia Mother    Diabetes Sister    Diabetes Maternal Grandmother    Diabetes Paternal Grandmother    Diabetes Sister    Heart attack Brother    Coronary artery disease Brother    Vascular Disease Brother    Hypertension Sister    Heart attack Brother     Social History   Socioeconomic History   Marital status: Widowed    Spouse name: Not on file   Number of children: Not on  file   Years of education: Not on file   Highest education level: Not on file  Occupational History   Not on file  Tobacco Use   Smoking status: Former    Packs/day: 1.00    Years: 37.00    Total pack years: 37.00    Types: Cigarettes    Quit date: 02/06/2010    Years since quitting: 11.9   Smokeless tobacco: Former    Types: Snuff  Vaping Use   Vaping Use: Never used  Substance and Sexual Activity   Alcohol use: Not Currently    Alcohol/week: 5.0 standard drinks of alcohol    Types: 5 Cans of beer per week    Comment: /h x of alcohol abuse -stopped 2012- now drinks 5 beer per week   Drug use: Not Currently    Types: Marijuana, "Crack" cocaine, Cocaine    Comment: hx of cocaine use- last use 2015; last use marijuana6/22/19,   Sexual activity: Yes  Other Topics Concern   Not on file  Social History Narrative   Lives with Significant Other x 43 years   Social Determinants of Health   Financial Resource Strain: Not on file  Food Insecurity: Not on file  Transportation Needs: Not on file  Physical Activity: Not on file  Stress: Not on file  Social Connections: Not on file  Intimate Partner Violence: Not on file      Review of Systems  Constitutional:  Negative for chills, fatigue and unexpected weight change.  HENT:  Negative for congestion, rhinorrhea, sneezing and sore throat.   Eyes:  Negative for redness.  Respiratory:  Negative for cough, chest tightness and shortness of breath.   Cardiovascular:  Negative for chest pain and palpitations.  Gastrointestinal:  Negative for abdominal pain, constipation, diarrhea, nausea and vomiting.  Genitourinary:  Negative for dysuria and frequency.  Musculoskeletal:  Positive for arthralgias (right shoulder) and back pain. Negative for joint swelling and neck pain.  Skin:  Negative for rash.  Neurological: Negative.  Negative for tremors and numbness.  Hematological:  Negative for adenopathy. Does not bruise/bleed easily.   Psychiatric/Behavioral:  Negative for behavioral problems (Depression), sleep disturbance and suicidal ideas. The patient is not nervous/anxious.     Vital Signs: BP 135/87   Pulse 100   Temp 98.3 F (36.8 C)   Resp 16   Ht '4\' 11"'  (1.499 m)  Wt 126 lb 12.8 oz (57.5 kg)   SpO2 93% Comment: roomair  BMI 25.61 kg/m    Physical Exam Vitals reviewed.  Constitutional:      General: She is not in acute distress.    Appearance: Normal appearance. She is normal weight. She is not ill-appearing.  HENT:     Head: Normocephalic and atraumatic.  Eyes:     Pupils: Pupils are equal, round, and reactive to light.  Cardiovascular:     Rate and Rhythm: Normal rate and regular rhythm.  Pulmonary:     Effort: Pulmonary effort is normal. No respiratory distress.  Musculoskeletal:        General: Tenderness (right shoulder) present.  Neurological:     Mental Status: She is alert and oriented to person, place, and time.     Cranial Nerves: No cranial nerve deficit.     Coordination: Coordination normal.     Gait: Gait normal.  Psychiatric:        Mood and Affect: Mood normal.        Behavior: Behavior normal.        Assessment/Plan: 1. Uncontrolled type 2 diabetes mellitus with hyperglycemia (HCC) A1c significantly improved at 5.9 today, no changes in medications or diet at this time.  Will repeat A1c in 3 months. - POCT HgB A1C  2. Acute pain of right shoulder Increased dose of tramadol to 100 mg every 6 hours as needed, limit of no more than 8 tablets per 24 hours, with 1 refill.  Will need to be seen in the office for additional refills.  Muscle relaxer also prescribed to help with musculoskeletal pain at night and spasms. - traMADol (ULTRAM) 50 MG tablet; Take 2 tablets (100 mg total) by mouth every 6 (six) hours as needed for moderate pain or severe pain. No more than 8 tablets per 24 hours  Dispense: 90 tablet; Refill: 1 - cyclobenzaprine (FLEXERIL) 10 MG tablet; Take 1 tablet  (10 mg total) by mouth at bedtime. Take one tab po qhs for back spasm prn only  Dispense: 30 tablet; Refill: 1  3. Anxiety Lexapro continues to be effective at current dose of 10 mg daily, refills ordered, continue as prescribed. - escitalopram (LEXAPRO) 10 MG tablet; Take 1 tablet (10 mg total) by mouth daily.  Dispense: 90 tablet; Refill: 1   General Counseling: Angela Jensen verbalizes understanding of the findings of todays visit and agrees with plan of treatment. I have discussed any further diagnostic evaluation that may be needed or ordered today. We also reviewed her medications today. she has been encouraged to call the office with any questions or concerns that should arise related to todays visit.    Orders Placed This Encounter  Procedures   POCT HgB A1C    Meds ordered this encounter  Medications   traMADol (ULTRAM) 50 MG tablet    Sig: Take 2 tablets (100 mg total) by mouth every 6 (six) hours as needed for moderate pain or severe pain. No more than 8 tablets per 24 hours    Dispense:  90 tablet    Refill:  1   cyclobenzaprine (FLEXERIL) 10 MG tablet    Sig: Take 1 tablet (10 mg total) by mouth at bedtime. Take one tab po qhs for back spasm prn only    Dispense:  30 tablet    Refill:  1   escitalopram (LEXAPRO) 10 MG tablet    Sig: Take 1 tablet (10 mg total) by mouth daily.  Dispense:  90 tablet    Refill:  1    Return in about 3 months (around 03/12/2022) for F/U, Recheck A1C, Angela Jensen PCP.   Total time spent:30 Minutes Time spent includes review of chart, medications, test results, and follow up plan with the patient.    Controlled Substance Database was reviewed by me.  This patient was seen by Jonetta Osgood, FNP-C in collaboration with Dr. Clayborn Bigness as a part of collaborative care agreement.   Quinterius Gaida R. Valetta Fuller, MSN, FNP-C Internal medicine

## 2021-12-16 ENCOUNTER — Telehealth: Payer: Self-pay

## 2021-12-16 DIAGNOSIS — J449 Chronic obstructive pulmonary disease, unspecified: Secondary | ICD-10-CM | POA: Diagnosis not present

## 2021-12-17 ENCOUNTER — Other Ambulatory Visit: Payer: Self-pay | Admitting: Nurse Practitioner

## 2021-12-17 DIAGNOSIS — F419 Anxiety disorder, unspecified: Secondary | ICD-10-CM

## 2021-12-17 MED ORDER — ALPRAZOLAM 0.25 MG PO TABS: ORAL_TABLET | ORAL | 0 refills | Status: DC

## 2021-12-18 NOTE — Telephone Encounter (Signed)
error 

## 2021-12-24 ENCOUNTER — Ambulatory Visit
Admission: RE | Admit: 2021-12-24 | Discharge: 2021-12-24 | Disposition: A | Discharge status: Home / Self Care | Payer: Medicare Other | Source: Ambulatory Visit | Attending: Radiation Oncology | Admitting: Radiation Oncology

## 2021-12-24 DIAGNOSIS — I700 Atherosclerosis of aorta: Secondary | ICD-10-CM | POA: Diagnosis not present

## 2021-12-24 DIAGNOSIS — J439 Emphysema, unspecified: Secondary | ICD-10-CM | POA: Diagnosis not present

## 2021-12-24 DIAGNOSIS — C3431 Malignant neoplasm of lower lobe, right bronchus or lung: Secondary | ICD-10-CM | POA: Insufficient documentation

## 2021-12-24 DIAGNOSIS — C3490 Malignant neoplasm of unspecified part of unspecified bronchus or lung: Secondary | ICD-10-CM | POA: Diagnosis not present

## 2021-12-24 LAB — POCT I-STAT CREATININE: Creatinine, Ser: 0.6 mg/dL (ref 0.44–1.00)

## 2021-12-24 MED ORDER — IOHEXOL 300 MG/ML  SOLN
75.0000 mL | Freq: Once | INTRAMUSCULAR | Status: AC | PRN
  Administered 2021-12-24: 75 mL via INTRAVENOUS

## 2021-12-29 ENCOUNTER — Ambulatory Visit
Admission: RE | Admit: 2021-12-29 | Discharge: 2021-12-29 | Disposition: A | Discharge status: Home / Self Care | Payer: Medicare Other | Source: Ambulatory Visit | Attending: Radiation Oncology | Admitting: Radiation Oncology

## 2021-12-29 ENCOUNTER — Encounter: Payer: Self-pay | Admitting: Radiation Oncology

## 2021-12-29 VITALS — BP 154/93 | HR 97 | Temp 98.0°F | Resp 20 | Ht 59.0 in | Wt 126.0 lb

## 2021-12-29 DIAGNOSIS — Z08 Encounter for follow-up examination after completed treatment for malignant neoplasm: Secondary | ICD-10-CM | POA: Diagnosis not present

## 2021-12-29 DIAGNOSIS — C3431 Malignant neoplasm of lower lobe, right bronchus or lung: Secondary | ICD-10-CM | POA: Diagnosis not present

## 2021-12-29 DIAGNOSIS — Z87891 Personal history of nicotine dependence: Secondary | ICD-10-CM | POA: Diagnosis not present

## 2021-12-29 DIAGNOSIS — Z923 Personal history of irradiation: Secondary | ICD-10-CM | POA: Insufficient documentation

## 2021-12-29 NOTE — Progress Notes (Signed)
Radiation Oncology ?Follow up Note ? ?Name: Angela Jensen   ?Date:   12/29/2021 ?MRN:  [id number] ?DOB: 02/16/1956  ? ? ?This 66 y.o. female presents to the clinic today for 42-month follow-up status post SBRT to her right lower lobe for multinodular adenocarcinoma patient previous treated to right upper lobe with SBRT. ? ?REFERRING PROVIDER: [name], MD ? ?HPI: Patient is a 66 year old female now at 6 months having completed SBRT to her right lower lobe for multinodular adenocarcinoma.  Seen today in routine follow-up she is doing well.  She continues to have problems with her breathing is on nasal oxygen.  She has had COVID x2.  This seems to affect her overall breathing pattern.  She specifically denies cough hemoptysis..Recent CT scans which I have reviewed showed increase in bandlike consolidation dependent portion of the right lower lobe consistent with evolving radiation changes.  She also is unchanged bandlike postradiation consolidation and fibrosis of the lateral segment of the right middle lobe right apex consistent with prior treatment. ? ?COMPLICATIONS OF TREATMENT: none ? ?FOLLOW UP COMPLIANCE: keeps appointments  ? ?PHYSICAL EXAM:  ?BP (!) 154/93   Pulse 97   Temp 98 ?F (36.7 ?C)   Resp 20   Ht 4\' 11"  (1.499 m)   Wt 126 lb (57.2 kg)   BMI 25.45 kg/m?  ?Frail-appearing female on nasal oxygen in NAD.  Well-developed well-nourished patient in NAD. HEENT reveals PERLA, EOMI, discs not visualized.  Oral cavity is clear. No oral mucosal lesions are identified. Neck is clear without evidence of cervical or supraclavicular adenopathy. Lungs are clear to A&P. Cardiac examination is essentially unremarkable with regular rate and rhythm without murmur rub or thrill. Abdomen is benign with no organomegaly or masses noted. Motor sensory and DTR levels are equal and symmetric in the upper and lower extremities. Cranial nerves II through XII are grossly intact. Proprioception is intact. No peripheral  adenopathy or edema is identified. No motor or sensory levels are noted. Crude visual fields are within normal range. ? ?RADIOLOGY RESULTS: CT scans reviewed compatible with above-stated findings ? ?PLAN: Present time patient is doing well changes on her CT scan are all consistent with radiation scarring.  I am pleased with her overall progress.  I have asked to see her back in 6 months for follow-up and then will start once year follow-up visits.  Patient is to call with any concerns. ? ?I would like to take this opportunity to thank you for allowing me to participate in the care of your patient.. ?  ? Noreene Filbert, MD ? ?

## 2022-01-01 ENCOUNTER — Encounter: Payer: Self-pay | Admitting: Internal Medicine

## 2022-01-01 ENCOUNTER — Ambulatory Visit: Payer: Medicare Other | Admitting: Internal Medicine

## 2022-01-01 VITALS — BP 170/96 | HR 93 | Temp 97.8°F | Resp 16 | Ht 59.0 in | Wt 124.2 lb

## 2022-01-01 DIAGNOSIS — J9611 Chronic respiratory failure with hypoxia: Secondary | ICD-10-CM

## 2022-01-01 DIAGNOSIS — C3431 Malignant neoplasm of lower lobe, right bronchus or lung: Secondary | ICD-10-CM

## 2022-01-01 DIAGNOSIS — J449 Chronic obstructive pulmonary disease, unspecified: Secondary | ICD-10-CM | POA: Diagnosis not present

## 2022-01-01 DIAGNOSIS — R0602 Shortness of breath: Secondary | ICD-10-CM | POA: Diagnosis not present

## 2022-01-01 DIAGNOSIS — Z9981 Dependence on supplemental oxygen: Secondary | ICD-10-CM | POA: Diagnosis not present

## 2022-01-01 NOTE — Patient Instructions (Signed)
Chronic Obstructive Pulmonary Disease  Chronic obstructive pulmonary disease (COPD) is a long-term (chronic) lung problem. When you have COPD, it is hard for air to get in and out of your lungs. Usually the condition gets worse over time, and your lungs will never return to normal. There are things you can do to keep yourself as healthy as possible. What are the causes? Smoking. This is the most common cause. Certain genes passed from parent to child (inherited). What increases the risk? Being exposed to secondhand smoke from cigarettes, pipes, or cigars. Being exposed to chemicals and other irritants, such as fumes and dust in the work environment. Having chronic lung conditions or infections. What are the signs or symptoms? Shortness of breath, especially during physical activity. A long-term cough with a large amount of thick mucus. Sometimes, the cough may not have any mucus (dry cough). Wheezing. Breathing quickly. Skin that looks gray or blue, especially in the fingers, toes, or lips. Feeling tired (fatigue). Weight loss. Chest tightness. Having infections often. Episodes when breathing symptoms become much worse (exacerbations). At the later stages of this disease, you may have swelling in the ankles, feet, or legs. How is this treated? Taking medicines. Quitting smoking, if you smoke. Rehabilitation. This includes steps to make your body work better. It may involve a team of specialists. Doing exercises. Making changes to your diet. Using oxygen. Lung surgery. Lung transplant. Comfort measures (palliative care). Follow these instructions at home: Medicines Take over-the-counter and prescription medicines only as told by your doctor. Talk to your doctor before taking any cough or allergy medicines. You may need to avoid medicines that cause your lungs to be dry. Lifestyle If you smoke, stop smoking. Smoking makes the problem worse. Do not smoke or use any products that  contain nicotine or tobacco. If you need help quitting, ask your doctor. Avoid being around things that make your breathing worse. This may include smoke, chemicals, and fumes. Stay active, but remember to rest as well. Learn and use tips on how to manage stress and control your breathing. Make sure you get enough sleep. Most adults need at least 7 hours of sleep every night. Eat healthy foods. Eat smaller meals more often. Rest before meals. Controlled breathing Learn and use tips on how to control your breathing as told by your doctor. Try: Breathing in (inhaling) through your nose for 1 second. Then, pucker your lips and breath out (exhale) through your lips for 2 seconds. Putting one hand on your belly (abdomen). Breathe in slowly through your nose for 1 second. Your hand on your belly should move out. Pucker your lips and breathe out slowly through your lips. Your hand on your belly should move in as you breathe out.  Controlled coughing Learn and use controlled coughing to clear mucus from your lungs. Follow these steps: Lean your head a little forward. Breathe in deeply. Try to hold your breath for 3 seconds. Keep your mouth slightly open while coughing 2 times. Spit any mucus out into a tissue. Rest and do the steps again 1 or 2 times as needed. General instructions Make sure you get all the shots (vaccines) that your doctor recommends. Ask your doctor about a flu shot and a pneumonia shot. Use oxygen therapy and pulmonary rehabilitation if told by your doctor. If you need home oxygen therapy, ask your doctor if you should buy a tool to measure your oxygen level (oximeter). Make a COPD action plan with your doctor. This helps you   to know what to do if you feel worse than usual. Manage any other conditions you have as told by your doctor. Avoid going outside when it is very hot, cold, or humid. Avoid people who have a sickness you can catch (contagious). Keep all follow-up  visits. Contact a doctor if: You cough up more mucus than usual. There is a change in the color or thickness of the mucus. It is harder to breathe than usual. Your breathing is faster than usual. You have trouble sleeping. You need to use your medicines more often than usual. You have trouble doing your normal activities such as getting dressed or walking around the house. Get help right away if: You have shortness of breath while resting. You have shortness of breath that stops you from: Being able to talk. Doing normal activities. Your chest hurts for longer than 5 minutes. Your skin color is more blue than usual. Your pulse oximeter shows that you have low oxygen for longer than 5 minutes. You have a fever. You feel too tired to breathe normally. These symptoms may represent a serious problem that is an emergency. Do not wait to see if the symptoms will go away. Get medical help right away. Call your local emergency services (911 in the [place] Do not drive yourself to the hospital. Summary Chronic obstructive pulmonary disease (COPD) is a long-term lung problem. The way your lungs work will never return to normal. Usually the condition gets worse over time. There are things you can do to keep yourself as healthy as possible. Take over-the-counter and prescription medicines only as told by your doctor. If you smoke, stop. Smoking makes the problem worse. This information is not intended to replace advice given to you by your health care provider. Make sure you discuss any questions you have with your health care provider. Document Revised: 06/11/2020 Document Reviewed: 06/11/2020 Elsevier Patient Education  2023 [employer]  

## 2022-01-01 NOTE — Progress Notes (Signed)
[facility], [place] [address]  Pulmonary Sleep Medicine   Office Visit Note  Patient Name: CORAIMA TIBBS DOB: 1956-05-05 MRN [id number]  Date of Service: 01/01/2022  Complaints/HPI: COPD on inhalers doing well. She has no flare ups no admissions. CT chest done for her lung cancer follow up. Doing well with some more scar no underlyung tumor. She continues to be on oxygen therapy as prescribed. Pacer has been working well  ROS  General: (-) fever, (-) chills, (-) night sweats, (-) weakness Skin: (-) rashes, (-) itching,. Eyes: (-) visual changes, (-) redness, (-) itching. Nose and Sinuses: (-) nasal stuffiness or itchiness, (-) postnasal drip, (-) nosebleeds, (-) sinus trouble. Mouth and Throat: (-) sore throat, (-) hoarseness. Neck: (-) swollen glands, (-) enlarged thyroid, (-) neck pain. Respiratory: - cough, (-) bloody sputum, + shortness of breath, - wheezing. Cardiovascular: - ankle swelling, (-) chest pain. Lymphatic: (-) lymph node enlargement. Neurologic: (-) numbness, (-) tingling. Psychiatric: (-) anxiety, (-) depression   Current Medication: Outpatient Encounter Medications as of 01/01/2022  Medication Sig   Accu-Chek Softclix Lancets lancets Use as instructed for twice a daily Dx E11.65   albuterol (PROVENTIL) (2.5 MG/3ML) 0.083% nebulizer solution USE 1 VIAL IN NEBULIZER EVERY 6 HOURS AS NEEDED FOR WHEEZING   albuterol (VENTOLIN HFA) 108 (90 Base) MCG/ACT inhaler INHALE 2 PUFFS BY MOUTH EVERY 6 HOURS AS NEEDED FOR WHEEZING OR SHORTNESS OF BREATH   ALPRAZolam (XANAX) 0.25 MG tablet TAKE 1 TABLET BY MOUTH THREE TIMES DAILY AS NEEDED FOR ANXIETY OR SLEEP   Blood Glucose Monitoring Suppl (ACCU-CHEK GUIDE) w/Device KIT Use as directed Dx e11.65   clopidogrel (PLAVIX) 75 MG tablet Take 1 tablet by mouth once daily   cyclobenzaprine (FLEXERIL) 10 MG tablet Take 1 tablet (10 mg total) by mouth at bedtime. Take one tab po qhs for back spasm  prn only   escitalopram (LEXAPRO) 10 MG tablet Take 1 tablet (10 mg total) by mouth daily.   ferrous sulfate 325 (65 FE) MG tablet Take 325 mg by mouth 2 (two) times daily with a meal.   fluticasone (FLONASE) 50 MCG/ACT nasal spray Place 2 sprays into both nostrils daily.   furosemide (LASIX) 20 MG tablet Take 20 mg by mouth daily as needed.   gabapentin (NEURONTIN) 100 MG capsule Take 1 capsule by mouth twice daily   glucose blood (ACCU-CHEK GUIDE) test strip USE  STRIP TO CHECK GLUCOSE first thing in the morning, before meals and at bedtime (check glucose level a total of 5 times daily).   insulin detemir (LEVEMIR FLEXTOUCH) 100 UNIT/ML FlexPen Inject 8- 12 units with supper qd// with needles   lisinopril (ZESTRIL) 10 MG tablet Take 2 tablets (20 mg total) by mouth daily.   metFORMIN (GLUCOPHAGE) 500 MG tablet Take 2 tablets (1,000 mg total) by mouth daily with breakfast.   metoprolol tartrate (LOPRESSOR) 25 MG tablet Take 1/2 (one-half) tablet by mouth twice daily   mometasone-formoterol (DULERA) 200-5 MCG/ACT AERO Inhale 2 puffs into the lungs 2 (two) times daily.   Omega-3 Fatty Acids (FISH OIL) 1000 MG CAPS Take 1 capsule by mouth daily.   OXYGEN Inhale 4 L into the lungs. PT USES [employer] FOR OXYGEN   potassium chloride (KLOR-CON) 10 MEQ tablet TAKE 1 TABLET BY MOUTH EVERY OTHER DAY   tiotropium (SPIRIVA HANDIHALER) 18 MCG inhalation capsule INHALE THE CONTENTS OF ONE CAPSULE BY MOUTH ONCE DAILY. DO NOT SWALLOW CAPSULE.   traMADol (ULTRAM)  50 MG tablet Take 2 tablets (100 mg total) by mouth every 6 (six) hours as needed for moderate pain or severe pain. No more than 8 tablets per 24 hours   famotidine (PEPCID) 20 MG tablet Take 1 tablet (20 mg total) by mouth daily for 14 days.   No facility-administered encounter medications on file as of 01/01/2022.    Surgical History: Past Surgical History:  Procedure Laterality Date   CESAREAN SECTION     x3   COLONOSCOPY WITH PROPOFOL N/A  06/25/2015   Procedure: COLONOSCOPY WITH PROPOFOL;  Surgeon: Lucilla Lame, MD;  Location: [facility];  Service: Endoscopy;  Laterality: N/A;   COLONOSCOPY WITH PROPOFOL N/A 07/26/2020   Procedure: COLONOSCOPY WITH PROPOFOL;  Surgeon: Lucilla Lame, MD;  Location: [place];  Service: Endoscopy;  Laterality: N/A;   CYST REMOVAL LEG     and on shoulder    ESOPHAGOGASTRODUODENOSCOPY (EGD) WITH PROPOFOL N/A 07/26/2020   Procedure: ESOPHAGOGASTRODUODENOSCOPY (EGD) WITH PROPOFOL;  Surgeon: Lucilla Lame, MD;  Location: [place];  Service: Endoscopy;  Laterality: N/A;  Diabetic - oral meds   LOWER EXTREMITY ANGIOGRAPHY Left 09/29/2018   Procedure: LOWER EXTREMITY ANGIOGRAPHY;  Surgeon: Algernon Huxley, MD;  Location: [place] [facility];  Service: Cardiovascular;  Laterality: Left;   LUNG BIOPSY  12/30/2011   has lung "spots"   PACEMAKER IMPLANT  07/14/2021   PACEMAKER LEADLESS INSERTION N/A 07/14/2021   Procedure: PACEMAKER LEADLESS INSERTION;  Surgeon: Isaias Cowman, MD;  Location: [place] [facility];  Service: Cardiovascular;  Laterality: N/A;   PERIPHERAL VASCULAR CATHETERIZATION Left 06/01/2016   Procedure: Lower Extremity Angiography;  Surgeon: Algernon Huxley, MD;  Location: [place] [facility];  Service: Cardiovascular;  Laterality: Left;   PERIPHERAL VASCULAR CATHETERIZATION N/A 06/01/2016   Procedure: Abdominal Aortogram w/Lower Extremity;  Surgeon: Algernon Huxley, MD;  Location: [place] [facility];  Service: Cardiovascular;  Laterality: N/A;   PERIPHERAL VASCULAR CATHETERIZATION  06/01/2016   Procedure: Lower Extremity Intervention;  Surgeon: Algernon Huxley, MD;  Location: [place] [facility];  Service: Cardiovascular;;   PERIPHERAL VASCULAR CATHETERIZATION Right 06/08/2016   Procedure: Lower Extremity Angiography;  Surgeon: Algernon Huxley, MD;  Location: [place] [facility];  Service: Cardiovascular;  Laterality: Right;   PERIPHERAL VASCULAR CATHETERIZATION   06/08/2016   Procedure: Lower Extremity Intervention;  Surgeon: Algernon Huxley, MD;  Location: [place] [facility];  Service: Cardiovascular;;   SUBMANDIBULAR GLAND EXCISION Left 12/06/2020   Procedure: EXCISION SUBMANDIBULAR GLAND;  Surgeon: Beverly Gust, MD;  Location: [place];  Service: ENT;  Laterality: Left;  needs to be first case Diabetic - diet controlled   TEMPORARY PACEMAKER N/A 07/11/2021   Procedure: TEMPORARY PACEMAKER;  Surgeon: Isaias Cowman, MD;  Location: [place] [facility];  Service: Cardiovascular;  Laterality: N/A;    Medical History: Past Medical History:  Diagnosis Date   Anemia    Arthritis    Asthma    Atherosclerosis of native arteries of extremity with intermittent claudication ([place]) 05/26/2016   Cancer ([place]) 2012   Right Lung CA   COPD (chronic obstructive pulmonary disease) ([place])    Depression    Diabetes mellitus without complication ([place])    Patient takes Janumet   Essential hypertension 05/26/2016   Heart failure ([place]) 2022   Hypercholesteremia    Oxygen dependent    2L at nite    PAD (peripheral artery disease) ([place]) 06/22/2016   Peripheral vascular disease ([place])  Personal history of radiation therapy    Shortness of breath dyspnea    with exertion    Sialolithiasis    Sleep apnea    Wears dentures    full upper and lower    Family History: Family History  Problem Relation Age of Onset   Lung cancer Father    Diabetes Mother    Hypercholesterolemia Mother    Diabetes Sister    Diabetes Maternal Grandmother    Diabetes Paternal Grandmother    Diabetes Sister    Heart attack Brother    Coronary artery disease Brother    Vascular Disease Brother    Hypertension Sister    Heart attack Brother     Social History: Social History   Socioeconomic History   Marital status: Widowed    Spouse name: Not on file   Number of children: Not on file   Years of education: Not on file   Highest education level: Not  on file  Occupational History   Not on file  Tobacco Use   Smoking status: Former    Packs/day: 1.00    Years: 37.00    Pack years: 37.00    Types: Cigarettes    Quit date: 02/06/2010    Years since quitting: 11.9   Smokeless tobacco: Former    Types: Snuff  Vaping Use   Vaping Use: Never used  Substance and Sexual Activity   Alcohol use: Not Currently    Alcohol/week: 5.0 standard drinks    Types: 5 Cans of beer per week    Comment: /h x of alcohol abuse -stopped 2012- now drinks 5 beer per week   Drug use: Not Currently    Types: Marijuana, "Crack" cocaine, Cocaine    Comment: hx of cocaine use- last use 2015; last use marijuana6/22/19,   Sexual activity: Yes  Other Topics Concern   Not on file  Social History Narrative   Lives with Significant Other x 43 years   Social Determinants of Health   Financial Resource Strain: Not on file  Food Insecurity: Not on file  Transportation Needs: Not on file  Physical Activity: Not on file  Stress: Not on file  Social Connections: Not on file  Intimate Partner Violence: Not on file    Vital Signs: Blood pressure (!) 170/96, pulse 93, temperature 97.8 F (36.6 C), resp. rate 16, height _0  (1.499 m), weight 124 lb 3.2 oz (56.3 kg), SpO2 96 %.  Examination: General Appearance: The patient is well-developed, well-nourished, and in no distress. Skin: Gross inspection of skin unremarkable. Head: normocephalic, no gross deformities. Eyes: no gross deformities noted. ENT: ears appear grossly normal no exudates. Neck: Supple. No thyromegaly. No LAD. Respiratory: no rhonchi noted. Cardiovascular: Normal S1 and S2 without murmur or rub. Extremities: No cyanosis. pulses are equal. Neurologic: Alert and oriented. No involuntary movements.  LABS: Recent Results (from the past 2160 hour(s))  POCT HgB A1C     Status: Abnormal   Collection Time: 12/11/21  1:46 PM  Result Value Ref Range   Hemoglobin A1C 5.9 (A) 4.0 - 5.6 %    HbA1c POC (<> result, manual entry)     HbA1c, POC (prediabetic range)     HbA1c, POC (controlled diabetic range)    I-STAT creatinine     Status: None   Collection Time: 12/24/21 10:20 AM  Result Value Ref Range   Creatinine, Ser 0.60 0.44 - 1.00 mg/dL    Radiology: No results found.  No results found.  CT Chest W Contrast  Result Date: 12/25/2021 CLINICAL DATA:  Lung cancer restaging, status post SBRT * Tracking Code: BO * EXAM: CT CHEST WITH CONTRAST TECHNIQUE: Multidetector CT imaging of the chest was performed during intravenous contrast administration. RADIATION DOSE REDUCTION: This exam was performed according to the departmental dose-optimization program which includes automated exposure control, adjustment of the mA and/or kV according to patient size and/or use of iterative reconstruction technique. CONTRAST:  21m OMNIPAQUE IOHEXOL 300 MG/ML  SOLN COMPARISON:  PET-CT, 08/14/2021 FINDINGS: Cardiovascular: Aortic atherosclerosis. Cardiomegaly. Left coronary artery calcifications. Right ventricular pacer. No pericardial effusion. Mediastinum/Nodes: No enlarged mediastinal, hilar, or axillary lymph nodes. Thyroid gland, trachea, and esophagus demonstrate no significant findings. Lungs/Pleura: Severe centrilobular emphysema. Unchanged bandlike consolidation and fibrosis of the lateral segment right middle lobe (series 3, image 70), as well as the right apex (series 3, image 26). Interval increase in consolidation of the dependent right lower lobe (series 3, image 88). No pleural effusion or pneumothorax. Upper Abdomen: No acute abnormality. Musculoskeletal: No chest wall abnormality. No suspicious osseous lesions identified. IMPRESSION: 1. Interval increase in bandlike consolidation of the dependent right lower lobe. This may reflect continued evolution and organization of radiation fibrosis or perhaps incidentally superimposed infection or aspiration. Local malignant recurrence not favored.  Attention on follow-up. 2. Unchanged bandlike post radiation consolidation and fibrosis of the lateral segment right middle lobe and right apex. 3. Severe emphysema. 4. Cardiomegaly and coronary artery disease. Aortic Atherosclerosis (ICD10-I70.0) and Emphysema (ICD10-J43.9). Electronically Signed   By: ADelanna AhmadiM.D.   On: 12/25/2021 15:51      Assessment and Plan: Patient Active Problem List   Diagnosis Date Noted   Symptomatic bradycardia 07/09/2021   Chronic respiratory failure with hypoxia (HCC)    CAP (community acquired pneumonia) 06/26/2021   COPD exacerbation ([place] 06/11/2021   SOB (shortness of breath) 05/01/2021   HCAP (healthcare-associated pneumonia) 05/01/2021   Chronic combined systolic and diastolic CHF (congestive heart failure) ([facility] 05/01/2021   Acute exacerbation of chronic obstructive pulmonary disease (COPD) ([facility] 04/30/2021   Elevated troponin 04/01/2021   Acute on chronic respiratory failure with hypoxia ([place] 03/31/2021   Pneumothorax after biopsy 05/03/2020   Pneumothorax on right 05/01/2020   Chest pain on breathing 04/21/2020   Pleuritic chest pain 04/11/2020   Right lower lobe pulmonary nodule 04/02/2020   Iron deficiency anemia 04/02/2020   Pica in adults 04/02/2020   Goals of care, counseling/discussion 03/14/2020   Pain in left wrist 09/24/2019   Encounter for general adult medical examination with abnormal findings 06/28/2019   Elevated total protein 01/22/2019   Uncontrolled type 2 diabetes mellitus with hyperglycemia ([facility] 05/16/2018   Chronic obstructive pulmonary disease ([facility] [place] 05/16/2018   Vitamin D deficiency 05/16/2018   Flu vaccine need 05/16/2018   Dysuria 05/16/2018   Cancer of lower lobe of right lung ([facility] [place] 01/21/2018   Screening for breast cancer 10/12/2017   Personal history of tobacco use, presenting hazards to health 02/03/2017   PAD (peripheral artery disease) ([place] 06/22/2016   Essential hypertension 05/26/2016   Diabetes  ([facility] 05/26/2016   Hyperlipidemia 05/26/2016   Atherosclerosis of native arteries of extremity with intermittent claudication ([facility] 05/26/2016   Uterine leiomyoma 04/30/2016   Elevated CEA 01/31/2016   Special screening for malignant neoplasms, colon    Benign neoplasm of sigmoid colon    Primary lung cancer ([facility]    Malignant neoplasm of upper lobe of right lung ([facility]     1. SOB (shortness of breath)  At baseline.  Still some scarring noted on the CT of the chest that she should continue with inhalers as prescribed. - Spirometry with Graph  2. Chronic obstructive pulmonary disease, unspecified COPD type ([place]) Continue with inhalers patient is on albuterol Ventolin and also is on Dulera as needed should be for the Ventolin and Dulera should be scheduled  3. Chronic respiratory failure with hypoxia (HCC) Oxygen therapy continue with the current flow rate  4. Cancer of lower lobe of right lung [facility]) Following with primary oncologist  5. Oxygen dependent Continue with oxygen on current flow rate   General Counseling: I have discussed the findings of the evaluation and examination with [facility].  I have also discussed any further diagnostic evaluation thatmay be needed or ordered today. Komal verbalizes understanding of the findings of todays visit. We also reviewed her medications today and discussed drug interactions and side effects including but not limited excessive drowsiness and altered mental states. We also discussed that there is always a risk not just to her but also people around her. she has been encouraged to call the office with any questions or concerns that should arise related to todays visit.  Orders Placed This Encounter  Procedures   Spirometry with Graph    Order Specific Question:   Where should this test be performed?    Answer:   [facility]    Order Specific Question:   Basic spirometry    Answer:   Yes    Order Specific Question:   Spirometry pre &  post bronchodilator    Answer:   No     Time spent: 38  I have personally obtained a history, examined the patient, evaluated laboratory and imaging results, formulated the assessment and plan and placed orders.    Allyne Gee, MD [facility] Pulmonary and Critical Care Sleep medicine

## 2022-01-05 DIAGNOSIS — Z950 Presence of cardiac pacemaker: Secondary | ICD-10-CM | POA: Diagnosis not present

## 2022-01-05 DIAGNOSIS — E782 Mixed hyperlipidemia: Secondary | ICD-10-CM | POA: Diagnosis not present

## 2022-01-05 DIAGNOSIS — E119 Type 2 diabetes mellitus without complications: Secondary | ICD-10-CM | POA: Diagnosis not present

## 2022-01-05 DIAGNOSIS — J449 Chronic obstructive pulmonary disease, unspecified: Secondary | ICD-10-CM | POA: Diagnosis not present

## 2022-01-05 DIAGNOSIS — I442 Atrioventricular block, complete: Secondary | ICD-10-CM | POA: Diagnosis not present

## 2022-01-05 DIAGNOSIS — G4730 Sleep apnea, unspecified: Secondary | ICD-10-CM | POA: Diagnosis not present

## 2022-01-05 DIAGNOSIS — I739 Peripheral vascular disease, unspecified: Secondary | ICD-10-CM | POA: Diagnosis not present

## 2022-01-05 DIAGNOSIS — I5020 Unspecified systolic (congestive) heart failure: Secondary | ICD-10-CM | POA: Diagnosis not present

## 2022-01-05 DIAGNOSIS — R519 Headache, unspecified: Secondary | ICD-10-CM | POA: Diagnosis not present

## 2022-01-05 DIAGNOSIS — J9621 Acute and chronic respiratory failure with hypoxia: Secondary | ICD-10-CM | POA: Diagnosis not present

## 2022-01-05 DIAGNOSIS — I700 Atherosclerosis of aorta: Secondary | ICD-10-CM | POA: Diagnosis not present

## 2022-01-05 DIAGNOSIS — I10 Essential (primary) hypertension: Secondary | ICD-10-CM | POA: Diagnosis not present

## 2022-01-07 DIAGNOSIS — J449 Chronic obstructive pulmonary disease, unspecified: Secondary | ICD-10-CM | POA: Diagnosis not present

## 2022-01-16 DIAGNOSIS — J449 Chronic obstructive pulmonary disease, unspecified: Secondary | ICD-10-CM | POA: Diagnosis not present

## 2022-01-20 ENCOUNTER — Other Ambulatory Visit: Payer: Self-pay | Admitting: Nurse Practitioner

## 2022-01-20 DIAGNOSIS — J449 Chronic obstructive pulmonary disease, unspecified: Secondary | ICD-10-CM

## 2022-01-25 ENCOUNTER — Encounter: Payer: Self-pay | Admitting: Nurse Practitioner

## 2022-01-27 ENCOUNTER — Other Ambulatory Visit: Payer: Self-pay | Admitting: Nurse Practitioner

## 2022-01-27 DIAGNOSIS — F419 Anxiety disorder, unspecified: Secondary | ICD-10-CM

## 2022-02-07 DIAGNOSIS — J449 Chronic obstructive pulmonary disease, unspecified: Secondary | ICD-10-CM | POA: Diagnosis not present

## 2022-02-11 ENCOUNTER — Other Ambulatory Visit: Payer: Self-pay | Admitting: Nurse Practitioner

## 2022-02-11 DIAGNOSIS — E1165 Type 2 diabetes mellitus with hyperglycemia: Secondary | ICD-10-CM

## 2022-02-15 DIAGNOSIS — J449 Chronic obstructive pulmonary disease, unspecified: Secondary | ICD-10-CM | POA: Diagnosis not present

## 2022-02-20 ENCOUNTER — Ambulatory Visit
Admission: RE | Admit: 2022-02-20 | Discharge: 2022-02-20 | Disposition: A | Discharge status: Home / Self Care | Payer: Medicare Other | Source: Ambulatory Visit | Attending: Oncology | Admitting: Oncology

## 2022-02-20 DIAGNOSIS — C3411 Malignant neoplasm of upper lobe, right bronchus or lung: Secondary | ICD-10-CM | POA: Diagnosis not present

## 2022-02-20 DIAGNOSIS — J432 Centrilobular emphysema: Secondary | ICD-10-CM | POA: Diagnosis not present

## 2022-02-20 DIAGNOSIS — C3490 Malignant neoplasm of unspecified part of unspecified bronchus or lung: Secondary | ICD-10-CM | POA: Diagnosis not present

## 2022-02-20 LAB — POCT I-STAT CREATININE: Creatinine, Ser: 0.7 mg/dL (ref 0.44–1.00)

## 2022-02-20 MED ORDER — IOHEXOL 300 MG/ML  SOLN
75.0000 mL | Freq: Once | INTRAMUSCULAR | Status: AC | PRN
  Administered 2022-02-20: 75 mL via INTRAVENOUS

## 2022-02-23 ENCOUNTER — Inpatient Hospital Stay (HOSPITAL_BASED_OUTPATIENT_CLINIC_OR_DEPARTMENT_OTHER): Payer: Medicare Other | Admitting: Oncology

## 2022-02-23 ENCOUNTER — Inpatient Hospital Stay: Payer: Medicare Other | Attending: Oncology

## 2022-02-23 ENCOUNTER — Encounter: Payer: Self-pay | Admitting: Oncology

## 2022-02-23 ENCOUNTER — Ambulatory Visit: Payer: Medicare Other | Admitting: Internal Medicine

## 2022-02-23 VITALS — BP 122/96 | HR 86 | Temp 97.9°F | Wt 121.0 lb

## 2022-02-23 DIAGNOSIS — D509 Iron deficiency anemia, unspecified: Secondary | ICD-10-CM | POA: Insufficient documentation

## 2022-02-23 DIAGNOSIS — I110 Hypertensive heart disease with heart failure: Secondary | ICD-10-CM | POA: Insufficient documentation

## 2022-02-23 DIAGNOSIS — C3411 Malignant neoplasm of upper lobe, right bronchus or lung: Secondary | ICD-10-CM | POA: Diagnosis not present

## 2022-02-23 DIAGNOSIS — E119 Type 2 diabetes mellitus without complications: Secondary | ICD-10-CM

## 2022-02-23 DIAGNOSIS — C3431 Malignant neoplasm of lower lobe, right bronchus or lung: Secondary | ICD-10-CM | POA: Diagnosis not present

## 2022-02-23 DIAGNOSIS — Z923 Personal history of irradiation: Secondary | ICD-10-CM | POA: Insufficient documentation

## 2022-02-23 DIAGNOSIS — Z87891 Personal history of nicotine dependence: Secondary | ICD-10-CM | POA: Insufficient documentation

## 2022-02-23 DIAGNOSIS — I10 Essential (primary) hypertension: Secondary | ICD-10-CM

## 2022-02-23 DIAGNOSIS — Z9981 Dependence on supplemental oxygen: Secondary | ICD-10-CM | POA: Insufficient documentation

## 2022-02-23 DIAGNOSIS — J9611 Chronic respiratory failure with hypoxia: Secondary | ICD-10-CM

## 2022-02-23 DIAGNOSIS — Z801 Family history of malignant neoplasm of trachea, bronchus and lung: Secondary | ICD-10-CM | POA: Diagnosis not present

## 2022-02-23 DIAGNOSIS — R970 Elevated carcinoembryonic antigen [CEA]: Secondary | ICD-10-CM

## 2022-02-23 DIAGNOSIS — E1151 Type 2 diabetes mellitus with diabetic peripheral angiopathy without gangrene: Secondary | ICD-10-CM | POA: Diagnosis not present

## 2022-02-23 LAB — COMPREHENSIVE METABOLIC PANEL
ALT: 17 U/L (ref 0–44)
AST: 23 U/L (ref 15–41)
Albumin: 3.9 g/dL (ref 3.5–5.0)
Alkaline Phosphatase: 65 U/L (ref 38–126)
Anion gap: 8 (ref 5–15)
BUN: 19 mg/dL (ref 8–23)
CO2: 30 mmol/L (ref 22–32)
Calcium: 9 mg/dL (ref 8.9–10.3)
Chloride: 96 mmol/L — ABNORMAL LOW (ref 98–111)
Creatinine, Ser: 0.8 mg/dL (ref 0.44–1.00)
GFR, Estimated: >60 mL/min (ref >60)
Glucose, Bld: 169 mg/dL — ABNORMAL HIGH (ref 70–99)
Potassium: 3.3 mmol/L — ABNORMAL LOW (ref 3.5–5.1)
Sodium: 134 mmol/L — ABNORMAL LOW (ref 135–145)
Total Bilirubin: 0.3 mg/dL (ref 0.3–1.2)
Total Protein: 7.3 g/dL (ref 6.5–8.1)

## 2022-02-23 LAB — CBC WITH DIFFERENTIAL/PLATELET
Abs Immature Granulocytes: 0.01 10*3/uL (ref 0.00–0.07)
Basophils Absolute: 0 10*3/uL (ref 0.0–0.1)
Basophils Relative: 1 %
Eosinophils Absolute: 0.1 10*3/uL (ref 0.0–0.5)
Eosinophils Relative: 1 %
HCT: 36.7 % (ref 36.0–46.0)
Hemoglobin: 11.1 g/dL — ABNORMAL LOW (ref 12.0–15.0)
Immature Granulocytes: 0 %
Lymphocytes Relative: 17 %
Lymphs Abs: 0.7 10*3/uL (ref 0.7–4.0)
MCH: 27.8 pg (ref 26.0–34.0)
MCHC: 30.2 g/dL (ref 30.0–36.0)
MCV: 91.8 fL (ref 80.0–100.0)
Monocytes Absolute: 0.4 10*3/uL (ref 0.1–1.0)
Monocytes Relative: 9 %
Neutro Abs: 3.1 10*3/uL (ref 1.7–7.7)
Neutrophils Relative %: 72 %
Platelets: 227 10*3/uL (ref 150–400)
RBC: 4 MIL/uL (ref 3.87–5.11)
RDW: 13.8 % (ref 11.5–15.5)
WBC: 4.3 10*3/uL (ref 4.0–10.5)
nRBC: 0 % (ref 0.0–0.2)

## 2022-02-23 NOTE — Progress Notes (Signed)
Hematology/Oncology Progress note [phone] [phone]         Clinic Day:  02/23/2022  Referring physician: [name], MD  Chief Complaint: Angela Jensen is a 66 y.o. female presents follow-up for  history of stage I lung cancer s/p radiation and iron deficiency anemia    PERTINENT ONCOLOGY HISTORY Patient previously followed up by Dr.Corcoran, patient switched care to me on 03/20/21 Extensive medical record review was performed by me  # Stage I RUL adenocarcinoma  s/p IMRT in 02/2012.   She was not a good surgery candidate secondary to her lung function (FEV1 28%).  CT guided biopsy of the superior and inferior RUL nodules on 12/30/2011 revealed well differentiated adenocarcinoma of lung primary.  EGFR testing revealed no mutation.  She received IMRT 6000 cGy to both nodules from 01/26/2012 - 03/10/2012.   In 04/2015, there was an attempt at radiofrequency ablation of 1 enlarging RUL nodule.  At the time of the procedure, the nodule had shrunk to 5 mm from 9 mm.     # Stage I RLL adenocarcinoma 2019   CT guided RLL biopsy on 02/09/2018 revealed adenocarcinoma of lung origin, lepidic and papillary patterns.   She received 6000 cGy in 5 fractions to RLL nodule from 03/22/2018 - 04/05/2018.   Chest CT on 03/04/2020 revealed enlarging and new pulmonary nodules suspicious for metastatic disease or metachronous primary.  PET scan on 03/19/2020 revealed that the right lower lobe pulmonary nodules did not demonstrate any hypermetabolism to suggest neoplasm. However, these were somewhat worrisome; recommendation was close CT surveillance. There was no mediastinal or hilar lymphadenopathy.  Chest CT  on 09/11/2020 revealed interval treatment of the right lower lobe nodules. These nodules were partly obscured by adjacent parenchymal opacity, although appear decreased in size.  CT guided RLL biopsy on 04/24/2020 revealed adenocarcinoma of the lung with papillary and solid  patterns. 05/28/2020 - 06/11/2020  SBRT to the right lung. She received 60 Gy in 5 fractions.  She has an elevated CEA of unclear etiology.  CEA was 8.1 on 01/31/2016, 9.2 on 03/03/2016, 7.9 on 10/12/2017, and 7.9 on 01/19/2019.   Colonoscopy on 06/25/2015 revealed a 4 mm polyp in the sigmoid colon.  Pathology revealed a tubular adenoma without evidence of dysplasia or malignancy.   Pelvic MRI on 04/30/2016 revealed a 3.4 x 2.5 cm ovoid mass in the LEFT adnexa with signal characteristics identical to the intramural leiomyomas and consistent with an exophytic leiomyoma.  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  Endometrium was normal with a normal junctional zone.  Ovaries were not identified.  # iron deficiency anemia.  She required 2 units of PRBCs on 04/04/2020.  Colonoscopy on 06/25/2015 revealed one 4 mm polyp (tubular adenoma) in the sigmoid colon. There were non-bleeding internal hemorrhoids.  Colonoscopy on 07/26/2020 revealed diverticulosis in the entire examined colon. There were non-bleeding internal hemorrhoids. EGD on 07/26/2020 revealed erosive gastropathy with no bleeding and no stigmata of recent bleeding.   07/11/2021, patient was hospitalized and a CT angio chest PE with contrast was ordered and there was slight increase in size".  3 over several months of larger than 1 cm nodular-like densities along the right minor and major fissure in the region of prior radiation therapy.  Recurrent malignancy cannot be excluded.   12/29 2022, a PET scan was obtained for further evaluation.  PET scan showed stable scarring type changes involving the right lung.  No hypermetabolic pulmonary lesions to suggest recurrent or metastatic tumor.  To the patient reports feeling well.  Chronic shortness of breath no changes.  INTERVAL HISTORY Angela Jensen is a 66 y.o. female who has above history reviewed by me today presents for follow up visit for iron deficiency anemia, history of lung cancer Today  patient reports feeling well.  She has no new complaints today.    Past Medical History:  Diagnosis Date   Anemia    Arthritis    Asthma    Atherosclerosis of native arteries of extremity with intermittent claudication ([place]) 05/26/2016   Cancer ([place]) 2012   Right Lung CA   COPD (chronic obstructive pulmonary disease) ([place])    Depression    Diabetes mellitus without complication ([place])    Patient takes Janumet   Essential hypertension 05/26/2016   Heart failure ([place]) 2022   Hypercholesteremia    Oxygen dependent    2L at nite    PAD (peripheral artery disease) ([place]) 06/22/2016   Peripheral vascular disease ([facility])    Personal history of radiation therapy    Shortness of breath dyspnea    with exertion    Sialolithiasis    Sleep apnea    Wears dentures    full upper and lower    Past Surgical History:  Procedure Laterality Date   CESAREAN SECTION     x3   COLONOSCOPY WITH PROPOFOL N/A 06/25/2015   Procedure: COLONOSCOPY WITH PROPOFOL;  Surgeon: Lucilla Lame, MD;  Location: [facility];  Service: Endoscopy;  Laterality: N/A;   COLONOSCOPY WITH PROPOFOL N/A 07/26/2020   Procedure: COLONOSCOPY WITH PROPOFOL;  Surgeon: Lucilla Lame, MD;  Location: [place];  Service: Endoscopy;  Laterality: N/A;   CYST REMOVAL LEG     and on shoulder    ESOPHAGOGASTRODUODENOSCOPY (EGD) WITH PROPOFOL N/A 07/26/2020   Procedure: ESOPHAGOGASTRODUODENOSCOPY (EGD) WITH PROPOFOL;  Surgeon: Lucilla Lame, MD;  Location: [place];  Service: Endoscopy;  Laterality: N/A;  Diabetic - oral meds   LOWER EXTREMITY ANGIOGRAPHY Left 09/29/2018   Procedure: LOWER EXTREMITY ANGIOGRAPHY;  Surgeon: Algernon Huxley, MD;  Location: [place] [facility];  Service: Cardiovascular;  Laterality: Left;   LUNG BIOPSY  12/30/2011   has lung "spots"   PACEMAKER IMPLANT  07/14/2021   PACEMAKER LEADLESS INSERTION N/A 07/14/2021   Procedure: PACEMAKER LEADLESS INSERTION;  Surgeon: Isaias Cowman,  MD;  Location: [place] [facility];  Service: Cardiovascular;  Laterality: N/A;   PERIPHERAL VASCULAR CATHETERIZATION Left 06/01/2016   Procedure: Lower Extremity Angiography;  Surgeon: Algernon Huxley, MD;  Location: [place] [facility];  Service: Cardiovascular;  Laterality: Left;   PERIPHERAL VASCULAR CATHETERIZATION N/A 06/01/2016   Procedure: Abdominal Aortogram w/Lower Extremity;  Surgeon: Algernon Huxley, MD;  Location: [place] [facility];  Service: Cardiovascular;  Laterality: N/A;   PERIPHERAL VASCULAR CATHETERIZATION  06/01/2016   Procedure: Lower Extremity Intervention;  Surgeon: Algernon Huxley, MD;  Location: [place] [facility];  Service: Cardiovascular;;   PERIPHERAL VASCULAR CATHETERIZATION Right 06/08/2016   Procedure: Lower Extremity Angiography;  Surgeon: Algernon Huxley, MD;  Location: [place] [facility];  Service: Cardiovascular;  Laterality: Right;   PERIPHERAL VASCULAR CATHETERIZATION  06/08/2016   Procedure: Lower Extremity Intervention;  Surgeon: Algernon Huxley, MD;  Location: [place] [facility];  Service: Cardiovascular;;   SUBMANDIBULAR GLAND EXCISION Left 12/06/2020   Procedure: EXCISION SUBMANDIBULAR GLAND;  Surgeon: Beverly Gust, MD;  Location: [place];  Service: ENT;  Laterality: Left;  needs to be first case Diabetic -  diet controlled   TEMPORARY PACEMAKER N/A 07/11/2021   Procedure: TEMPORARY PACEMAKER;  Surgeon: Isaias Cowman, MD;  Location: [place] [facility];  Service: Cardiovascular;  Laterality: N/A;    Family History  Problem Relation Age of Onset   Lung cancer Father    Diabetes Mother    Hypercholesterolemia Mother    Diabetes Sister    Diabetes Maternal Grandmother    Diabetes Paternal Grandmother    Diabetes Sister    Heart attack Brother    Coronary artery disease Brother    Vascular Disease Brother    Hypertension Sister    Heart attack Brother     Social History:  reports that she quit smoking about 12 years ago. Her  smoking use included cigarettes. She has a 37.00 pack-year smoking history. She has quit using smokeless tobacco.  Her smokeless tobacco use included snuff. She reports that she does not currently use alcohol after a past usage of about 5.0 standard drinks of alcohol per week. She reports that she does not currently use drugs after having used the following drugs: Marijuana, "Crack" cocaine, and Cocaine.   Allergies:  Allergies  Allergen Reactions   Trelegy Ellipta [Fluticasone-Umeclidin-Vilant]     Had breathing issues    Current Medications: Current Outpatient Medications  Medication Sig Dispense Refill   Accu-Chek Softclix Lancets lancets Use as instructed for twice a daily Dx E11.65 100 each 1   albuterol (PROVENTIL) (2.5 MG/3ML) 0.083% nebulizer solution USE 1 VIAL IN NEBULIZER EVERY 6 HOURS AS NEEDED FOR WHEEZING 1080 mL 1   albuterol (VENTOLIN HFA) 108 (90 Base) MCG/ACT inhaler INHALE 2 PUFFS BY MOUTH EVERY 6 HOURS AS NEEDED FOR WHEEZING OR SHORTNESS OF BREATH 18 g 3   ALPRAZolam (XANAX) 0.25 MG tablet TAKE 1 TABLET BY MOUTH THREE TIMES DAILY AS NEEDED FOR ANXIETY 90 tablet 0   Blood Glucose Monitoring Suppl (ACCU-CHEK GUIDE) w/Device KIT Use as directed Dx e11.65 1 kit 0   clopidogrel (PLAVIX) 75 MG tablet Take 1 tablet by mouth once daily 90 tablet 0   cyclobenzaprine (FLEXERIL) 10 MG tablet Take 1 tablet (10 mg total) by mouth at bedtime. Take one tab po qhs for back spasm prn only 30 tablet 1   escitalopram (LEXAPRO) 10 MG tablet Take 1 tablet (10 mg total) by mouth daily. 90 tablet 1   ferrous sulfate 325 (65 FE) MG tablet Take 325 mg by mouth 2 (two) times daily with a meal.     fluticasone (FLONASE) 50 MCG/ACT nasal spray Place 2 sprays into both nostrils daily. 9.9 mL 2   furosemide (LASIX) 20 MG tablet Take 20 mg by mouth daily as needed.     gabapentin (NEURONTIN) 100 MG capsule Take 1 capsule by mouth twice daily 90 capsule 1   glucose blood (ACCU-CHEK GUIDE) test strip USE   STRIP TO CHECK GLUCOSE first thing in the morning, before meals and at bedtime (check glucose level a total of 5 times daily). 500 each 3   insulin detemir (LEVEMIR FLEXTOUCH) 100 UNIT/ML FlexPen Inject 8- 12 units with supper qd// with needles 6 mL 11   lisinopril (ZESTRIL) 10 MG tablet Take 2 tablets (20 mg total) by mouth daily. 30 tablet 0   lisinopril-hydrochlorothiazide (ZESTORETIC) 20-25 MG tablet Take 1 tablet by mouth daily.     metFORMIN (GLUCOPHAGE) 500 MG tablet Take 2 tablets (1,000 mg total) by mouth daily with breakfast. 180 tablet 1   metoprolol tartrate (LOPRESSOR) 25 MG tablet Take 1/2 (  one-half) tablet by mouth twice daily 90 tablet 1   mometasone-formoterol (DULERA) 200-5 MCG/ACT AERO Inhale 2 puffs into the lungs 2 (two) times daily. 13 g 5   Omega-3 Fatty Acids (FISH OIL) 1000 MG CAPS Take 1 capsule by mouth daily.     OXYGEN Inhale 4 L into the lungs. PT USES [facility] FOR OXYGEN     potassium chloride (KLOR-CON) 10 MEQ tablet TAKE 1 TABLET BY MOUTH EVERY OTHER DAY 45 tablet 3   tiotropium (SPIRIVA HANDIHALER) 18 MCG inhalation capsule INHALE 1 PUFF BY MOUTH ONCE DAILY. DO NOT SWALLOW CAPSULE 90 capsule 1   traMADol (ULTRAM) 50 MG tablet Take 2 tablets (100 mg total) by mouth every 6 (six) hours as needed for moderate pain or severe pain. No more than 8 tablets per 24 hours 90 tablet 1   famotidine (PEPCID) 20 MG tablet Take 1 tablet (20 mg total) by mouth daily for 14 days. 14 tablet 0   No current facility-administered medications for this visit.    Review of Systems  Constitutional:  Negative for chills, diaphoresis, fever, malaise/fatigue and weight loss.  HENT:  Negative for congestion, ear discharge, ear pain, hearing loss, nosebleeds, sinus pain, sore throat and tinnitus.   Eyes: Negative.  Negative for blurred vision.  Respiratory:  Negative for cough, hemoptysis, sputum production and shortness of breath.        Patient is on nasal cannula oxygen   Cardiovascular:  Negative for chest pain, palpitations and leg swelling.  Gastrointestinal: Negative.  Negative for abdominal pain, blood in stool, constipation, diarrhea, heartburn, melena, nausea and vomiting.  Genitourinary: Negative.  Negative for dysuria, frequency, hematuria and urgency.  Musculoskeletal:  Negative for back pain, joint pain, myalgias and neck pain.  Skin: Negative.  Negative for itching and rash.  Neurological: Negative.  Negative for dizziness, tingling, sensory change, speech change, focal weakness, weakness and headaches.  Endo/Heme/Allergies: Negative.  Does not bruise/bleed easily.       Diabetes, under good control  Psychiatric/Behavioral: Negative.  Negative for depression and memory loss. The patient is not nervous/anxious and does not have insomnia.   All other systems reviewed and are negative.   Performance status (ECOG): 0-1  Vitals:  Blood pressure (!) 122/96, pulse 86, temperature 97.9 F (36.6 C), temperature source Tympanic, weight 121 lb (54.9 kg).  Physical Exam Vitals and nursing note reviewed.  Constitutional:      General: She is not in acute distress.    Appearance: She is not diaphoretic.  HENT:     Head: Atraumatic.     Comments: Dark hair.    Mouth/Throat:     Mouth: Mucous membranes are moist.     Pharynx: Oropharynx is clear.  Eyes:     General: No scleral icterus.    Conjunctiva/sclera: Conjunctivae normal.  Cardiovascular:     Rate and Rhythm: Normal rate and regular rhythm.     Pulses: Normal pulses.     Heart sounds: Normal heart sounds.  Pulmonary:     Effort: Pulmonary effort is normal. No respiratory distress.     Breath sounds: No wheezing or rales.     Comments: Decreased breath sound bilaterally Patient is on nasal cannula oxygen. Abdominal:     General: There is no distension.     Palpations: Abdomen is soft. There is no hepatomegaly or splenomegaly.     Tenderness: There is no abdominal tenderness.   Musculoskeletal:        General: No swelling.  Lymphadenopathy:     Head:     Right side of head: No preauricular, posterior auricular or occipital adenopathy.     Left side of head: No preauricular, posterior auricular or occipital adenopathy.     Cervical: No cervical adenopathy.     Upper Body:     Right upper body: No supraclavicular or axillary adenopathy.     Left upper body: No supraclavicular or axillary adenopathy.     Lower Body: No right inguinal adenopathy. No left inguinal adenopathy.  Skin:    General: Skin is warm and dry.  Neurological:     Mental Status: She is alert and oriented to person, place, and time.  Psychiatric:        Mood and Affect: Mood normal.    RADIOGRAPHIC STUDIES: I have personally reviewed the radiological images as listed and agreed with the findings in the report. CT Chest W Contrast  Result Date: 02/20/2022 CLINICAL DATA:  Lung cancer restaging.  * Tracking Code: BO * EXAM: CT CHEST WITH CONTRAST TECHNIQUE: Multidetector CT imaging of the chest was performed during intravenous contrast administration. RADIATION DOSE REDUCTION: This exam was performed according to the departmental dose-optimization program which includes automated exposure control, adjustment of the mA and/or kV according to patient size and/or use of iterative reconstruction technique. CONTRAST:  48m OMNIPAQUE IOHEXOL 300 MG/ML  SOLN COMPARISON:  12/24/2021. FINDINGS: Cardiovascular: The heart size is normal. No substantial pericardial effusion. Coronary artery calcification is evident. Mild atherosclerotic calcification is noted in the wall of the thoracic aorta. Mediastinum/Nodes: No mediastinal lymphadenopathy. There is no hilar lymphadenopathy. The esophagus has normal imaging features. There is no axillary lymphadenopathy. Lungs/Pleura: Centrilobular and paraseptal emphysema evident. Similar pleuroparenchymal scarring in the right lung apex. Bandlike scarring in the lateral right  middle lobe with volume loss is stable. There is similar interstitial and parenchymal plaque-like opacity in the posterior right lower lobe although this does appear to be more consolidative than on the prior study and shows much more confluent opacity in comparing back to the PET-CT of 08/14/2021. 6 mm left upper lobe nodule on 46/3 is unchanged. No new suspicious pulmonary nodule or mass. 4 mm left lower lobe nodule on 108/3 was present previously and is unchanged. There is a new immediately adjacent 5 mm nodule on the same image (108/3). No pleural effusion. Upper Abdomen: Unremarkable. Musculoskeletal: No worrisome lytic or sclerotic osseous abnormality. IMPRESSION: 1. Continued interval progression of the consolidative plaque-like opacity in the right lower lobe, notably progressive when comparing back to the PET-CT from 08/14/2021. While this may reflect evolution of post treatment change, local recurrence cannot be excluded. PET-CT may be warranted to further evaluate. 2. New 5 mm left lower lobe pulmonary nodule. Close attention on follow-up recommended. 3. Aortic Atherosclerosis (ICD10-I70.0) and Emphysema (ICD10-J43.9). Electronically Signed   By: EMisty StanleyM.D.   On: 02/20/2022 16:26   CT Chest W Contrast  Result Date: 12/25/2021 CLINICAL DATA:  Lung cancer restaging, status post SBRT * Tracking Code: BO * EXAM: CT CHEST WITH CONTRAST TECHNIQUE: Multidetector CT imaging of the chest was performed during intravenous contrast administration. RADIATION DOSE REDUCTION: This exam was performed according to the departmental dose-optimization program which includes automated exposure control, adjustment of the mA and/or kV according to patient size and/or use of iterative reconstruction technique. CONTRAST:  735mOMNIPAQUE IOHEXOL 300 MG/ML  SOLN COMPARISON:  PET-CT, 08/14/2021 FINDINGS: Cardiovascular: Aortic atherosclerosis. Cardiomegaly. Left coronary artery calcifications. Right ventricular pacer. No  pericardial effusion. Mediastinum/Nodes:  No enlarged mediastinal, hilar, or axillary lymph nodes. Thyroid gland, trachea, and esophagus demonstrate no significant findings. Lungs/Pleura: Severe centrilobular emphysema. Unchanged bandlike consolidation and fibrosis of the lateral segment right middle lobe (series 3, image 70), as well as the right apex (series 3, image 26). Interval increase in consolidation of the dependent right lower lobe (series 3, image 88). No pleural effusion or pneumothorax. Upper Abdomen: No acute abnormality. Musculoskeletal: No chest wall abnormality. No suspicious osseous lesions identified. IMPRESSION: 1. Interval increase in bandlike consolidation of the dependent right lower lobe. This may reflect continued evolution and organization of radiation fibrosis or perhaps incidentally superimposed infection or aspiration. Local malignant recurrence not favored. Attention on follow-up. 2. Unchanged bandlike post radiation consolidation and fibrosis of the lateral segment right middle lobe and right apex. 3. Severe emphysema. 4. Cardiomegaly and coronary artery disease. Aortic Atherosclerosis (ICD10-I70.0) and Emphysema (ICD10-J43.9). Electronically Signed   By: Delanna Ahmadi M.D.   On: 12/25/2021 15:51     Assessment and Plan: 1. Malignant neoplasm of upper lobe of right lung ([place])   2. Chronic respiratory failure with hypoxia ([facility])   3. Cancer of lower lobe of right lung ([place])     #History of Stage I RUL adenocarcinoma-status post IMRT completed 03/10/2012. 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 -Recurrence-SBRT completed 06/11/2020. Foundation one NGS showed TMB >10 muts/mb, MS stable, KRAS G12V, MYC amplification,STK11 X726OM*35, FUBP1 splice sit 597+4B>U 10/23/4534, CT chest with contrast showed continued interval progression of the consolidative plaque-like opacity in the right lower lobe.  This area was previously FDG negative on PET scan in  December 2022.  New 5 mm left lower lobe pulmonary nodule. I will discuss patient's case in the tumor board Either continue CT surveillance versus repeat PET scan/biopsy.  #Chronic respiratory failure, continue oxygen supplementation.   Follow-up to be determined. Earlie Server, MD, PhD

## 2022-02-26 ENCOUNTER — Telehealth (INDEPENDENT_AMBULATORY_CARE_PROVIDER_SITE_OTHER): Payer: Self-pay | Admitting: Vascular Surgery

## 2022-02-26 NOTE — Telephone Encounter (Signed)
Called Pt no answer and could not leave a message.

## 2022-02-26 NOTE — Telephone Encounter (Signed)
Patient LVM wanting to make an appointment with Dr. Lucky Cowboy to have an ultrasound on her kidney.  Please advise

## 2022-02-27 ENCOUNTER — Other Ambulatory Visit (INDEPENDENT_AMBULATORY_CARE_PROVIDER_SITE_OTHER): Payer: Self-pay | Admitting: Vascular Surgery

## 2022-02-27 NOTE — Telephone Encounter (Signed)
She needs a renal duplex and see dew or me

## 2022-02-27 NOTE — Telephone Encounter (Signed)
Called pt and she states that her cardiologist wants her to have her kidney ultrasounded by Dr Lucky Cowboy.  Please advise

## 2022-03-02 ENCOUNTER — Other Ambulatory Visit (INDEPENDENT_AMBULATORY_CARE_PROVIDER_SITE_OTHER): Payer: Self-pay | Admitting: Nurse Practitioner

## 2022-03-02 DIAGNOSIS — I10 Essential (primary) hypertension: Secondary | ICD-10-CM

## 2022-03-03 ENCOUNTER — Ambulatory Visit (INDEPENDENT_AMBULATORY_CARE_PROVIDER_SITE_OTHER): Payer: Medicare Other | Admitting: Vascular Surgery

## 2022-03-03 ENCOUNTER — Encounter (INDEPENDENT_AMBULATORY_CARE_PROVIDER_SITE_OTHER): Payer: Medicare Other

## 2022-03-05 ENCOUNTER — Inpatient Hospital Stay: Payer: Medicare Other

## 2022-03-06 ENCOUNTER — Telehealth: Payer: Self-pay | Admitting: Oncology

## 2022-03-06 ENCOUNTER — Telehealth: Payer: Self-pay

## 2022-03-06 DIAGNOSIS — C3411 Malignant neoplasm of upper lobe, right bronchus or lung: Secondary | ICD-10-CM

## 2022-03-06 NOTE — Telephone Encounter (Signed)
Unable to talke to pt. Detailed VM left. Please schedule patient and I will call her with appt details.

## 2022-03-06 NOTE — Telephone Encounter (Signed)
LVM for pt with PET appt information and instructions

## 2022-03-06 NOTE — Telephone Encounter (Signed)
-----   Message from Angela Server, MD sent at 03/05/2022 12:55 PM EDT ----- I have discussed her case on tumor board, we recommend repeating PET scan- restaging for further evaluation. Please let her know and arrange.thanks.  Follow up app TBD

## 2022-03-09 ENCOUNTER — Encounter: Payer: Self-pay | Admitting: Hematology and Oncology

## 2022-03-09 DIAGNOSIS — J449 Chronic obstructive pulmonary disease, unspecified: Secondary | ICD-10-CM | POA: Diagnosis not present

## 2022-03-10 ENCOUNTER — Telehealth: Payer: Self-pay

## 2022-03-10 ENCOUNTER — Encounter: Payer: Self-pay | Admitting: Nurse Practitioner

## 2022-03-10 ENCOUNTER — Ambulatory Visit (INDEPENDENT_AMBULATORY_CARE_PROVIDER_SITE_OTHER): Payer: Medicare Other | Admitting: Nurse Practitioner

## 2022-03-10 VITALS — BP 98/62 | HR 83 | Temp 98.3°F | Resp 16 | Ht 59.0 in | Wt 121.0 lb

## 2022-03-10 DIAGNOSIS — I5042 Chronic combined systolic (congestive) and diastolic (congestive) heart failure: Secondary | ICD-10-CM | POA: Diagnosis not present

## 2022-03-10 DIAGNOSIS — E1165 Type 2 diabetes mellitus with hyperglycemia: Secondary | ICD-10-CM

## 2022-03-10 DIAGNOSIS — J9611 Chronic respiratory failure with hypoxia: Secondary | ICD-10-CM

## 2022-03-10 DIAGNOSIS — Z9981 Dependence on supplemental oxygen: Secondary | ICD-10-CM

## 2022-03-10 DIAGNOSIS — M7541 Impingement syndrome of right shoulder: Secondary | ICD-10-CM | POA: Diagnosis not present

## 2022-03-10 DIAGNOSIS — F419 Anxiety disorder, unspecified: Secondary | ICD-10-CM

## 2022-03-10 LAB — POCT GLYCOSYLATED HEMOGLOBIN (HGB A1C): Hemoglobin A1C: 8.6 % — AB (ref 4.0–5.6)

## 2022-03-10 MED ORDER — HYDROCODONE-ACETAMINOPHEN 5-325 MG PO TABS: 1.0000 | ORAL_TABLET | Freq: Four times a day (QID) | ORAL | 0 refills | Status: DC | PRN

## 2022-03-10 MED ORDER — ALPRAZOLAM 0.25 MG PO TABS: ORAL_TABLET | ORAL | 2 refills | Status: DC

## 2022-03-10 MED ORDER — LEVEMIR FLEXTOUCH 100 UNIT/ML ~~LOC~~ SOPN: 12.0000 [IU] | PEN_INJECTOR | Freq: Every day | SUBCUTANEOUS | 11 refills | Status: DC

## 2022-03-10 MED ORDER — METFORMIN HCL 500 MG PO TABS
1000.0000 mg | ORAL_TABLET | Freq: Every day | ORAL | 1 refills | Status: DC
Start: 2022-03-10 — End: 2022-09-18

## 2022-03-10 MED ORDER — HYDROCODONE-ACETAMINOPHEN 5-300 MG PO TABS: 1.0000 | ORAL_TABLET | Freq: Four times a day (QID) | ORAL | 0 refills | Status: DC | PRN

## 2022-03-10 MED ORDER — DAPAGLIFLOZIN PROPANEDIOL 10 MG PO TABS: 10.0000 mg | ORAL_TABLET | Freq: Every day | ORAL | 3 refills | Status: DC

## 2022-03-10 NOTE — Telephone Encounter (Signed)
Called [facility] and canceled hydrocodone 5/300 due to is not covered and send hydrocodone 5/325 and xanax to [facility]

## 2022-03-10 NOTE — Progress Notes (Signed)
[facility] [place], [place] [address]  Internal MEDICINE  Office Visit Note  Patient Name: Angela Jensen  [id number]  [id number]  Date of Service: 03/10/2022  Chief Complaint  Patient presents with   Follow-up   Hypertension   Diabetes   Shoulder Pain    Right shoulder pain    HPI Angela Jensen presents for follow-up visit for continued right shoulder pain, diabetes, hypertension, anxiety and depression.  Angela Jensen is requesting refills of medications.  --Her hemoglobin A1c was 5.9 in april earlier this year and is significantly elevated at 8.6. Angela Jensen was expecting this increase because her glucose levels have been >200s lately.  Increased dose of tramadol has not been helping her right shoulder, pain is still significant Nephew passed away a few weeks ago and her daughter's brother-in-law. Increased stress and worry about her son because Angela Jensen has not heard from him and he is not great at communication.      Current Medication: Outpatient Encounter Medications as of 03/10/2022  Medication Sig   Accu-Chek Softclix Lancets lancets Use as instructed for twice a daily Dx E11.65   albuterol (PROVENTIL) (2.5 MG/3ML) 0.083% nebulizer solution USE 1 VIAL IN NEBULIZER EVERY 6 HOURS AS NEEDED FOR WHEEZING   albuterol (VENTOLIN HFA) 108 (90 Base) MCG/ACT inhaler INHALE 2 PUFFS BY MOUTH EVERY 6 HOURS AS NEEDED FOR WHEEZING OR SHORTNESS OF BREATH   Blood Glucose Monitoring Suppl (ACCU-CHEK GUIDE) w/Device KIT Use as directed Dx e11.65   clopidogrel (PLAVIX) 75 MG tablet Take 1 tablet by mouth once daily   cyclobenzaprine (FLEXERIL) 10 MG tablet Take 1 tablet (10 mg total) by mouth at bedtime. Take one tab po qhs for back spasm prn only   dapagliflozin propanediol (FARXIGA) 10 MG TABS tablet Take 1 tablet (10 mg total) by mouth daily.   escitalopram (LEXAPRO) 10 MG tablet Take 1 tablet (10 mg total) by mouth daily.   ferrous sulfate 325 (65 FE) MG tablet Take 325 mg by mouth 2 (two)  times daily with a meal.   fluticasone (FLONASE) 50 MCG/ACT nasal spray Place 2 sprays into both nostrils daily.   furosemide (LASIX) 20 MG tablet Take 20 mg by mouth daily as needed.   gabapentin (NEURONTIN) 100 MG capsule Take 1 capsule by mouth twice daily   glucose blood (ACCU-CHEK GUIDE) test strip USE  STRIP TO CHECK GLUCOSE first thing in the morning, before meals and at bedtime (check glucose level a total of 5 times daily).   HYDROcodone-acetaminophen (NORCO/VICODIN) 5-325 MG tablet Take 1 tablet by mouth every 6 (six) hours as needed for moderate pain or severe pain.   lisinopril (ZESTRIL) 10 MG tablet Take 2 tablets (20 mg total) by mouth daily.   lisinopril-hydrochlorothiazide (ZESTORETIC) 20-25 MG tablet Take 1 tablet by mouth daily.   metoprolol tartrate (LOPRESSOR) 25 MG tablet Take 1/2 (one-half) tablet by mouth twice daily   mometasone-formoterol (DULERA) 200-5 MCG/ACT AERO Inhale 2 puffs into the lungs 2 (two) times daily.   Omega-3 Fatty Acids (FISH OIL) 1000 MG CAPS Take 1 capsule by mouth daily.   OXYGEN Inhale 4 L into the lungs. Angela Jensen USES [facility] FOR OXYGEN   potassium chloride (KLOR-CON) 10 MEQ tablet TAKE 1 TABLET BY MOUTH EVERY OTHER DAY   tiotropium (SPIRIVA HANDIHALER) 18 MCG inhalation capsule INHALE 1 PUFF BY MOUTH ONCE DAILY. DO NOT SWALLOW CAPSULE   [DISCONTINUED] ALPRAZolam (XANAX) 0.25 MG tablet TAKE 1 TABLET BY MOUTH THREE TIMES DAILY AS NEEDED  FOR ANXIETY   [DISCONTINUED] HYDROcodone-Acetaminophen 5-300 MG TABS Take 1 tablet by mouth every 6 (six) hours as needed (moderate to severe shoulder pain).   [DISCONTINUED] insulin detemir (LEVEMIR FLEXTOUCH) 100 UNIT/ML FlexPen Inject 8- 12 units with supper qd// with needles   [DISCONTINUED] metFORMIN (GLUCOPHAGE) 500 MG tablet Take 2 tablets (1,000 mg total) by mouth daily with breakfast.   [DISCONTINUED] traMADol (ULTRAM) 50 MG tablet Take 2 tablets (100 mg total) by mouth every 6 (six) hours as needed for moderate  pain or severe pain. No more than 8 tablets per 24 hours   ALPRAZolam (XANAX) 0.25 MG tablet TAKE 1 TABLET BY MOUTH THREE TIMES DAILY AS NEEDED FOR ANXIETY   famotidine (PEPCID) 20 MG tablet Take 1 tablet (20 mg total) by mouth daily for 14 days.   insulin detemir (LEVEMIR FLEXTOUCH) 100 UNIT/ML FlexPen Inject 12 Units into the skin daily with supper. Inject 8- 12 units with supper qd// with needles   metFORMIN (GLUCOPHAGE) 500 MG tablet Take 2 tablets (1,000 mg total) by mouth daily with breakfast.   No facility-administered encounter medications on file as of 03/10/2022.    Surgical History: Past Surgical History:  Procedure Laterality Date   CESAREAN SECTION     x3   COLONOSCOPY WITH PROPOFOL N/A 06/25/2015   Procedure: COLONOSCOPY WITH PROPOFOL;  Surgeon: Lucilla Lame, MD;  Location: [facility];  Service: Endoscopy;  Laterality: N/A;   COLONOSCOPY WITH PROPOFOL N/A 07/26/2020   Procedure: COLONOSCOPY WITH PROPOFOL;  Surgeon: Lucilla Lame, MD;  Location: [place];  Service: Endoscopy;  Laterality: N/A;   CYST REMOVAL LEG     and on shoulder    ESOPHAGOGASTRODUODENOSCOPY (EGD) WITH PROPOFOL N/A 07/26/2020   Procedure: ESOPHAGOGASTRODUODENOSCOPY (EGD) WITH PROPOFOL;  Surgeon: Lucilla Lame, MD;  Location: [place];  Service: Endoscopy;  Laterality: N/A;  Diabetic - oral meds   LOWER EXTREMITY ANGIOGRAPHY Left 09/29/2018   Procedure: LOWER EXTREMITY ANGIOGRAPHY;  Surgeon: Algernon Huxley, MD;  Location: [place] [facility];  Service: Cardiovascular;  Laterality: Left;   LUNG BIOPSY  12/30/2011   has lung "spots"   PACEMAKER IMPLANT  07/14/2021   PACEMAKER LEADLESS INSERTION N/A 07/14/2021   Procedure: PACEMAKER LEADLESS INSERTION;  Surgeon: Isaias Cowman, MD;  Location: [place] [facility];  Service: Cardiovascular;  Laterality: N/A;   PERIPHERAL VASCULAR CATHETERIZATION Left 06/01/2016   Procedure: Lower Extremity Angiography;  Surgeon: Algernon Huxley, MD;   Location: [place] [facility];  Service: Cardiovascular;  Laterality: Left;   PERIPHERAL VASCULAR CATHETERIZATION N/A 06/01/2016   Procedure: Abdominal Aortogram w/Lower Extremity;  Surgeon: Algernon Huxley, MD;  Location: [place] [facility];  Service: Cardiovascular;  Laterality: N/A;   PERIPHERAL VASCULAR CATHETERIZATION  06/01/2016   Procedure: Lower Extremity Intervention;  Surgeon: Algernon Huxley, MD;  Location: [place] [facility];  Service: Cardiovascular;;   PERIPHERAL VASCULAR CATHETERIZATION Right 06/08/2016   Procedure: Lower Extremity Angiography;  Surgeon: Algernon Huxley, MD;  Location: [place] [facility];  Service: Cardiovascular;  Laterality: Right;   PERIPHERAL VASCULAR CATHETERIZATION  06/08/2016   Procedure: Lower Extremity Intervention;  Surgeon: Algernon Huxley, MD;  Location: [place] [facility];  Service: Cardiovascular;;   SUBMANDIBULAR GLAND EXCISION Left 12/06/2020   Procedure: EXCISION SUBMANDIBULAR GLAND;  Surgeon: Beverly Gust, MD;  Location: [place];  Service: ENT;  Laterality: Left;  needs to be first case Diabetic - diet controlled   TEMPORARY PACEMAKER N/A 07/11/2021   Procedure: TEMPORARY PACEMAKER;  Surgeon: Isaias Cowman, MD;  Location: [place] [facility];  Service: Cardiovascular;  Laterality: N/A;    Medical History: Past Medical History:  Diagnosis Date   Anemia    Arthritis    Asthma    Atherosclerosis of native arteries of extremity with intermittent claudication ([place]) 05/26/2016   Cancer ([place]) 2012   Right Lung CA   COPD (chronic obstructive pulmonary disease) ([place])    Depression    Diabetes mellitus without complication ([place])    Patient takes Janumet   Essential hypertension 05/26/2016   Heart failure ([place]) 2022   Hypercholesteremia    Oxygen dependent    2L at nite    PAD (peripheral artery disease) ([place]) 06/22/2016   Peripheral vascular disease (HCC)    Personal history of radiation therapy    Shortness of breath  dyspnea    with exertion    Sialolithiasis    Sleep apnea    Wears dentures    full upper and lower    Family History: Family History  Problem Relation Age of Onset   Lung cancer Father    Diabetes Mother    Hypercholesterolemia Mother    Diabetes Sister    Diabetes Maternal Grandmother    Diabetes Paternal Grandmother    Diabetes Sister    Heart attack Brother    Coronary artery disease Brother    Vascular Disease Brother    Hypertension Sister    Heart attack Brother     Social History   Socioeconomic History   Marital status: Widowed    Spouse name: Not on file   Number of children: Not on file   Years of education: Not on file   Highest education level: Not on file  Occupational History   Not on file  Tobacco Use   Smoking status: Former    Packs/day: 1.00    Years: 37.00    Total pack years: 37.00    Types: Cigarettes    Quit date: 02/06/2010    Years since quitting: 12.1   Smokeless tobacco: Former    Types: Snuff  Vaping Use   Vaping Use: Never used  Substance and Sexual Activity   Alcohol use: Not Currently    Alcohol/week: 5.0 standard drinks of alcohol    Types: 5 Cans of beer per week    Comment: /h x of alcohol abuse -stopped 2012- now drinks 5 beer per week   Drug use: Not Currently    Types: Marijuana, "Crack" cocaine, Cocaine    Comment: hx of cocaine use- last use 2015; last use marijuana6/22/19,   Sexual activity: Yes  Other Topics Concern   Not on file  Social History Narrative   Lives with Significant Other x 43 years   Social Determinants of Health   Financial Resource Strain: Not on file  Food Insecurity: Not on file  Transportation Needs: Not on file  Physical Activity: Not on file  Stress: Not on file  Social Connections: Not on file  Intimate Partner Violence: Not on file      Review of Systems  Constitutional:  Negative for chills, fatigue and unexpected weight change.  HENT:  Negative for congestion, rhinorrhea,  sneezing and sore throat.   Eyes:  Negative for redness.  Respiratory:  Negative for cough, chest tightness and shortness of breath.   Cardiovascular:  Negative for chest pain and palpitations.  Gastrointestinal:  Negative for abdominal pain, constipation, diarrhea, nausea and vomiting.  Genitourinary:  Negative for dysuria and frequency.  Musculoskeletal:  Positive for arthralgias (right shoulder) and back pain. Negative for joint swelling and neck pain.  Skin:  Negative for rash.  Neurological: Negative.  Negative for tremors and numbness.  Hematological:  Negative for adenopathy. Does not bruise/bleed easily.  Psychiatric/Behavioral:  Negative for behavioral problems (Depression), sleep disturbance and suicidal ideas. The patient is not nervous/anxious.     Vital Signs: BP 98/62   Pulse 83   Temp 98.3 F (36.8 C)   Resp 16   Ht '4\' 11"'  (1.499 m)   Wt 121 lb (54.9 kg)   SpO2 97%   BMI 24.44 kg/m    Physical Exam Vitals reviewed.  Constitutional:      General: Angela Jensen is not in acute distress.    Appearance: Normal appearance. Angela Jensen is normal weight. Angela Jensen is not ill-appearing.  HENT:     Head: Normocephalic and atraumatic.  Eyes:     Pupils: Pupils are equal, round, and reactive to light.  Cardiovascular:     Rate and Rhythm: Normal rate and regular rhythm.  Pulmonary:     Effort: Pulmonary effort is normal. No respiratory distress.  Musculoskeletal:        General: Tenderness (right shoulder) present.  Neurological:     Mental Status: Angela Jensen is alert and oriented to person, place, and time.     Cranial Nerves: No cranial nerve deficit.     Coordination: Coordination normal.     Gait: Gait normal.  Psychiatric:        Mood and Affect: Mood normal.        Behavior: Behavior normal.        Assessment/Plan: 1. Uncontrolled type 2 diabetes mellitus with hyperglycemia (HCC) A1c was 8.6, which is significantly increased. Discussed diet and lifestyle modifications in detail.  Her blood glucose has been elevated and Angela Jensen has been more lax on her diet.  Will repeat A1c in 3 months. - POCT HgB A1C   2. Acute pain of right shoulder Medication prescribed for pain and muscle relaxant.  - traMADol (ULTRAM) 50 MG tablet; Take 2 tablets (100 mg total) by mouth every 6 (six) hours as needed for moderate pain or severe pain. No more than 8 tablets per 24 hours  Dispense: 90 tablet; Refill: 1 - cyclobenzaprine (FLEXERIL) 10 MG tablet; Take 1 tablet (10 mg total) by mouth at bedtime. Take one tab po qhs for back spasm prn only  Dispense: 30 tablet; Refill: 1   3. Anxiety Stable, continue medication as prescribed.  - escitalopram (LEXAPRO) 10 MG tablet; Take 1 tablet (10 mg total) by mouth daily.  Dispense: 90 tablet; Refill: 1  General Counseling: Angela Jensen verbalizes understanding of the findings of todays visit and agrees with plan of treatment. I have discussed any further diagnostic evaluation that may be needed or ordered today. We also reviewed her medications today. Angela Jensen has been encouraged to call the office with any questions or concerns that should arise related to todays visit.    Orders Placed This Encounter  Procedures   Ambulatory referral to Physical Therapy   POCT HgB A1C    Meds ordered this encounter  Medications   metFORMIN (GLUCOPHAGE) 500 MG tablet    Sig: Take 2 tablets (1,000 mg total) by mouth daily with breakfast.    Dispense:  180 tablet    Refill:  1    Do not fill until Angela Jensen requests   insulin detemir (LEVEMIR FLEXTOUCH) 100 UNIT/ML FlexPen    Sig: Inject 12 Units into  the skin daily with supper. Inject 8- 12 units with supper qd// with needles    Dispense:  6 mL    Refill:  11    For future refills   dapagliflozin propanediol (FARXIGA) 10 MG TABS tablet    Sig: Take 1 tablet (10 mg total) by mouth daily.    Dispense:  30 tablet    Refill:  3    Dx code E11.65, I50.42, please fill this prescription asap, may need prior auth, please let the  clinic know   DISCONTD: HYDROcodone-Acetaminophen 5-300 MG TABS    Sig: Take 1 tablet by mouth every 6 (six) hours as needed (moderate to severe shoulder pain).    Dispense:  20 tablet    Refill:  0    Please fill today.   HYDROcodone-acetaminophen (NORCO/VICODIN) 5-325 MG tablet    Sig: Take 1 tablet by mouth every 6 (six) hours as needed for moderate pain or severe pain.    Dispense:  20 tablet    Refill:  0   ALPRAZolam (XANAX) 0.25 MG tablet    Sig: TAKE 1 TABLET BY MOUTH THREE TIMES DAILY AS NEEDED FOR ANXIETY    Dispense:  90 tablet    Refill:  2    Return in about 3 months (around 06/10/2022) for F/U, Recheck A1C, Jase Reep PCP.   Total time spent:30 Minutes Time spent includes review of chart, medications, test results, and follow up plan with the patient.   [place] Controlled Substance Database was reviewed by me.  This patient was seen by Jonetta Osgood, FNP-C in collaboration with Dr. Clayborn Bigness as a part of collaborative care agreement.   Donovan Gatchel R. Valetta Fuller, MSN, FNP-C Internal medicine

## 2022-03-17 ENCOUNTER — Ambulatory Visit (INDEPENDENT_AMBULATORY_CARE_PROVIDER_SITE_OTHER): Payer: Medicare Other

## 2022-03-17 ENCOUNTER — Encounter (INDEPENDENT_AMBULATORY_CARE_PROVIDER_SITE_OTHER): Payer: Self-pay | Admitting: Nurse Practitioner

## 2022-03-17 ENCOUNTER — Ambulatory Visit (INDEPENDENT_AMBULATORY_CARE_PROVIDER_SITE_OTHER): Payer: Medicare Other | Admitting: Nurse Practitioner

## 2022-03-17 VITALS — BP 123/73 | HR 99 | Resp 17 | Ht 59.0 in | Wt 119.0 lb

## 2022-03-17 DIAGNOSIS — E1165 Type 2 diabetes mellitus with hyperglycemia: Secondary | ICD-10-CM

## 2022-03-17 DIAGNOSIS — E785 Hyperlipidemia, unspecified: Secondary | ICD-10-CM

## 2022-03-17 DIAGNOSIS — Z794 Long term (current) use of insulin: Secondary | ICD-10-CM

## 2022-03-17 DIAGNOSIS — I70213 Atherosclerosis of native arteries of extremities with intermittent claudication, bilateral legs: Secondary | ICD-10-CM | POA: Diagnosis not present

## 2022-03-17 DIAGNOSIS — I10 Essential (primary) hypertension: Secondary | ICD-10-CM

## 2022-03-17 NOTE — Progress Notes (Signed)
Subjective:    [name], female    DOB: 1956-03-15, 66 y.o.   MRN: [id number] No chief complaint on file.   Angela Jensen is a 66 year old female who presents today for concern for hypertension.  She notes that her cardiologist wanted her to have an ultrasound of her kidneys due to concern this may be a cause of her hypertension.  The patient does have known peripheral arterial disease with a previous history of lower extremity revascularizations.  She does endorse some slight claudication.  But no rest pain or open wounds or ulcerations today.  This morning her blood pressure is well controlled.  Today noninvasive studies show no evidence of significant stenosis in the bilateral renal arteries.  Normal size kidneys bilaterally.    Review of Systems  All other systems reviewed and are negative.      Objective:   Physical Exam Vitals reviewed.  HENT:     Head: Normocephalic.  Cardiovascular:     Rate and Rhythm: Normal rate.     Pulses:          Dorsalis pedis pulses are 1+ on the right side and 1+ on the left side.       Posterior tibial pulses are 1+ on the right side and 1+ on the left side.  Pulmonary:     Effort: Pulmonary effort is normal.  Skin:    General: Skin is warm and dry.  Neurological:     Mental Status: She is alert and oriented to person, place, and time.  Psychiatric:        Mood and Affect: Mood normal.        Behavior: Behavior normal.        Thought Content: Thought content normal.        Judgment: Judgment normal.     BP 123/73 (BP Location: Left Arm)   Pulse 99   Resp 17   Ht _0  (1.499 m)   Wt 119 lb (54 kg)   BMI 24.04 kg/m   Past Medical History:  Diagnosis Date   Anemia    Arthritis    Asthma    Atherosclerosis of native arteries of extremity with intermittent claudication ([place]) 05/26/2016   Cancer ([place]) 2012   Right Lung CA   COPD (chronic obstructive pulmonary disease) ([place])    Depression    Diabetes mellitus  without complication ([place])    Patient takes Janumet   Essential hypertension 05/26/2016   Heart failure ([place]) 2022   Hypercholesteremia    Oxygen dependent    2L at nite    PAD (peripheral artery disease) ([place]) 06/22/2016   Peripheral vascular disease (HCC)    Personal history of radiation therapy    Shortness of breath dyspnea    with exertion    Sialolithiasis    Sleep apnea    Wears dentures    full upper and lower    Social History   Socioeconomic History   Marital status: Widowed    Spouse name: Not on file   Number of children: Not on file   Years of education: Not on file   Highest education level: Not on file  Occupational History   Not on file  Tobacco Use   Smoking status: Former    Packs/day: 1.00    Years: 37.00    Total pack years: 37.00    Types: Cigarettes    Quit date: 02/06/2010    Years since quitting: 12.1  Smokeless tobacco: Former    Types: Snuff  Vaping Use   Vaping Use: Never used  Substance and Sexual Activity   Alcohol use: Not Currently    Alcohol/week: 5.0 standard drinks of alcohol    Types: 5 Cans of beer per week    Comment: /h x of alcohol abuse -stopped 2012- now drinks 5 beer per week   Drug use: Not Currently    Types: Marijuana, "Crack" cocaine, Cocaine    Comment: hx of cocaine use- last use 2015; last use marijuana6/22/19,   Sexual activity: Yes  Other Topics Concern   Not on file  Social History Narrative   Lives with Significant Other x 43 years   Social Determinants of Health   Financial Resource Strain: Not on file  Food Insecurity: Not on file  Transportation Needs: Not on file  Physical Activity: Not on file  Stress: Not on file  Social Connections: Not on file  Intimate Partner Violence: Not on file    Past Surgical History:  Procedure Laterality Date   CESAREAN SECTION     x3   COLONOSCOPY WITH PROPOFOL N/A 06/25/2015   Procedure: COLONOSCOPY WITH PROPOFOL;  Surgeon: Lucilla Lame, MD;  Location: [facility]  ENDOSCOPY;  Service: Endoscopy;  Laterality: N/A;   COLONOSCOPY WITH PROPOFOL N/A 07/26/2020   Procedure: COLONOSCOPY WITH PROPOFOL;  Surgeon: Lucilla Lame, MD;  Location: [place];  Service: Endoscopy;  Laterality: N/A;   CYST REMOVAL LEG     and on shoulder    ESOPHAGOGASTRODUODENOSCOPY (EGD) WITH PROPOFOL N/A 07/26/2020   Procedure: ESOPHAGOGASTRODUODENOSCOPY (EGD) WITH PROPOFOL;  Surgeon: Lucilla Lame, MD;  Location: [place];  Service: Endoscopy;  Laterality: N/A;  Diabetic - oral meds   LOWER EXTREMITY ANGIOGRAPHY Left 09/29/2018   Procedure: LOWER EXTREMITY ANGIOGRAPHY;  Surgeon: Algernon Huxley, MD;  Location: [place] [facility];  Service: Cardiovascular;  Laterality: Left;   LUNG BIOPSY  12/30/2011   has lung "spots"   PACEMAKER IMPLANT  07/14/2021   PACEMAKER LEADLESS INSERTION N/A 07/14/2021   Procedure: PACEMAKER LEADLESS INSERTION;  Surgeon: Isaias Cowman, MD;  Location: [place] [facility];  Service: Cardiovascular;  Laterality: N/A;   PERIPHERAL VASCULAR CATHETERIZATION Left 06/01/2016   Procedure: Lower Extremity Angiography;  Surgeon: Algernon Huxley, MD;  Location: [place] [facility];  Service: Cardiovascular;  Laterality: Left;   PERIPHERAL VASCULAR CATHETERIZATION N/A 06/01/2016   Procedure: Abdominal Aortogram w/Lower Extremity;  Surgeon: Algernon Huxley, MD;  Location: [place] [facility];  Service: Cardiovascular;  Laterality: N/A;   PERIPHERAL VASCULAR CATHETERIZATION  06/01/2016   Procedure: Lower Extremity Intervention;  Surgeon: Algernon Huxley, MD;  Location: [place] [facility];  Service: Cardiovascular;;   PERIPHERAL VASCULAR CATHETERIZATION Right 06/08/2016   Procedure: Lower Extremity Angiography;  Surgeon: Algernon Huxley, MD;  Location: [place] [facility];  Service: Cardiovascular;  Laterality: Right;   PERIPHERAL VASCULAR CATHETERIZATION  06/08/2016   Procedure: Lower Extremity Intervention;  Surgeon: Algernon Huxley, MD;  Location: [place] [facility];  Service: Cardiovascular;;   SUBMANDIBULAR GLAND EXCISION Left 12/06/2020   Procedure: EXCISION SUBMANDIBULAR GLAND;  Surgeon: Beverly Gust, MD;  Location: [place];  Service: ENT;  Laterality: Left;  needs to be first case Diabetic - diet controlled   TEMPORARY PACEMAKER N/A 07/11/2021   Procedure: TEMPORARY PACEMAKER;  Surgeon: Isaias Cowman, MD;  Location: [place] [facility];  Service: Cardiovascular;  Laterality: N/A;    Family History  Problem Relation Age  of Onset   Lung cancer Father    Diabetes Mother    Hypercholesterolemia Mother    Diabetes Sister    Diabetes Maternal Grandmother    Diabetes Paternal Grandmother    Diabetes Sister    Heart attack Brother    Coronary artery disease Brother    Vascular Disease Brother    Hypertension Sister    Heart attack Brother     Allergies  Allergen Reactions   Trelegy Ellipta [Fluticasone-Umeclidin-Vilant]     Had breathing issues       Latest Ref Rng & Units 02/23/2022   12:43 PM 07/14/2021    5:13 AM 07/13/2021    5:06 AM  CBC  WBC 4.0 - 10.5 K/uL 4.3  6.8  7.6   Hemoglobin 12.0 - 15.0 g/dL 11.1  9.6  9.2   Hematocrit 36.0 - 46.0 % 36.7  31.0  29.9   Platelets 150 - 400 K/uL 227  181  185       CMP     Component Value Date/Time   NA 134 (L) 02/23/2022 1243   NA 137 03/28/2020 1037   NA 141 04/17/2014 1055   K 3.3 (L) 02/23/2022 1243   K 3.9 04/17/2014 1055   CL 96 (L) 02/23/2022 1243   CL 102 04/17/2014 1055   CO2 30 02/23/2022 1243   CO2 30 04/17/2014 1055   GLUCOSE 169 (H) 02/23/2022 1243   GLUCOSE 201 (H) 04/17/2014 1055   BUN 19 02/23/2022 1243   BUN 10 03/28/2020 1037   BUN 10 04/17/2014 1055   CREATININE 0.80 02/23/2022 1243   CREATININE 0.90 04/17/2014 1055   CALCIUM 9.0 02/23/2022 1243   CALCIUM 9.1 04/17/2014 1055   PROT 7.3 02/23/2022 1243   PROT 7.6 03/28/2020 1037   PROT 8.1 04/17/2014 1055   ALBUMIN 3.9 02/23/2022 1243   ALBUMIN 4.7 03/28/2020  1037   ALBUMIN 4.1 04/17/2014 1055   AST 23 02/23/2022 1243   AST 27 04/17/2014 1055   ALT 17 02/23/2022 1243   ALT 29 04/17/2014 1055   ALKPHOS 65 02/23/2022 1243   ALKPHOS 73 04/17/2014 1055   BILITOT 0.3 02/23/2022 1243   BILITOT 0.2 03/28/2020 1037   BILITOT 0.4 04/17/2014 1055   GFRNONAA >60 02/23/2022 1243   GFRNONAA >60 04/17/2014 1055   GFRAA >60 05/05/2020 0558   GFRAA >60 04/17/2014 1055     No results found.     Assessment & Plan:   1. Essential hypertension Recommend:  The patient has evidence of atherosclerotic changes of the renal artery, however they are not clinically significant.  At this time the patient's blood pressure is fairly well controlled.  Patient does not need angiography of the renal artery given no significant stenosis.  The patient voices understanding of this plan and agrees.      2. Atherosclerosis of native artery of both lower extremities with intermittent claudication [facility]) Patient has some claudication not lifestyle limiting at this point.  Patient does have a scheduled follow-up in a little over a month.  We will reevaluate ABIs at that time.  Patient to continue with antiplatelet therapy.  3. Type 2 diabetes mellitus with hyperglycemia, with long-term current use of insulin (HCC) Continue hypoglycemic medications as already ordered, these medications have been reviewed and there are no changes at this time.  Hgb A1C to be monitored as already arranged by primary service   4. Hyperlipidemia, unspecified hyperlipidemia type Continue statin as ordered and reviewed, no changes at  this time    Current Outpatient Medications on File Prior to Visit  Medication Sig Dispense Refill   Accu-Chek Softclix Lancets lancets Use as instructed for twice a daily Dx E11.65 100 each 1   albuterol (PROVENTIL) (2.5 MG/3ML) 0.083% nebulizer solution USE 1 VIAL IN NEBULIZER EVERY 6 HOURS AS NEEDED FOR WHEEZING 1080 mL 1   albuterol (VENTOLIN HFA) 108  (90 Base) MCG/ACT inhaler INHALE 2 PUFFS BY MOUTH EVERY 6 HOURS AS NEEDED FOR WHEEZING OR SHORTNESS OF BREATH 18 g 3   ALPRAZolam (XANAX) 0.25 MG tablet TAKE 1 TABLET BY MOUTH THREE TIMES DAILY AS NEEDED FOR ANXIETY 90 tablet 2   Blood Glucose Monitoring Suppl (ACCU-CHEK GUIDE) w/Device KIT Use as directed Dx e11.65 1 kit 0   clopidogrel (PLAVIX) 75 MG tablet Take 1 tablet by mouth once daily 90 tablet 0   cyclobenzaprine (FLEXERIL) 10 MG tablet Take 1 tablet (10 mg total) by mouth at bedtime. Take one tab po qhs for back spasm prn only 30 tablet 1   dapagliflozin propanediol (FARXIGA) 10 MG TABS tablet Take 1 tablet (10 mg total) by mouth daily. 30 tablet 3   escitalopram (LEXAPRO) 10 MG tablet Take 1 tablet (10 mg total) by mouth daily. 90 tablet 1   ferrous sulfate 325 (65 FE) MG tablet Take 325 mg by mouth 2 (two) times daily with a meal.     fluticasone (FLONASE) 50 MCG/ACT nasal spray Place 2 sprays into both nostrils daily. 9.9 mL 2   furosemide (LASIX) 20 MG tablet Take 20 mg by mouth daily as needed.     gabapentin (NEURONTIN) 100 MG capsule Take 1 capsule by mouth twice daily 90 capsule 1   glucose blood (ACCU-CHEK GUIDE) test strip USE  STRIP TO CHECK GLUCOSE first thing in the morning, before meals and at bedtime (check glucose level a total of 5 times daily). 500 each 3   HYDROcodone-acetaminophen (NORCO/VICODIN) 5-325 MG tablet Take 1 tablet by mouth every 6 (six) hours as needed for moderate pain or severe pain. 20 tablet 0   insulin detemir (LEVEMIR FLEXTOUCH) 100 UNIT/ML FlexPen Inject 12 Units into the skin daily with supper. Inject 8- 12 units with supper qd// with needles 6 mL 11   lisinopril (ZESTRIL) 10 MG tablet Take 2 tablets (20 mg total) by mouth daily. 30 tablet 0   lisinopril-hydrochlorothiazide (ZESTORETIC) 20-25 MG tablet Take 1 tablet by mouth daily.     metFORMIN (GLUCOPHAGE) 500 MG tablet Take 2 tablets (1,000 mg total) by mouth daily with breakfast. 180 tablet 1    metoprolol tartrate (LOPRESSOR) 25 MG tablet Take 1/2 (one-half) tablet by mouth twice daily 90 tablet 1   mometasone-formoterol (DULERA) 200-5 MCG/ACT AERO Inhale 2 puffs into the lungs 2 (two) times daily. 13 g 5   Omega-3 Fatty Acids (FISH OIL) 1000 MG CAPS Take 1 capsule by mouth daily.     OXYGEN Inhale 4 L into the lungs. PT USES [employer] FOR OXYGEN     potassium chloride (KLOR-CON) 10 MEQ tablet TAKE 1 TABLET BY MOUTH EVERY OTHER DAY 45 tablet 3   tiotropium (SPIRIVA HANDIHALER) 18 MCG inhalation capsule INHALE 1 PUFF BY MOUTH ONCE DAILY. DO NOT SWALLOW CAPSULE 90 capsule 1   famotidine (PEPCID) 20 MG tablet Take 1 tablet (20 mg total) by mouth daily for 14 days. 14 tablet 0   No current facility-administered medications on file prior to visit.    There are no Patient Instructions on file  for this visit. No follow-ups on file.   Kris Hartmann, NP

## 2022-03-18 ENCOUNTER — Encounter
Admission: RE | Admit: 2022-03-18 | Discharge: 2022-03-18 | Disposition: A | Discharge status: Home / Self Care | Payer: Medicare Other | Source: Ambulatory Visit | Attending: Oncology | Admitting: Oncology

## 2022-03-18 DIAGNOSIS — C3411 Malignant neoplasm of upper lobe, right bronchus or lung: Secondary | ICD-10-CM | POA: Insufficient documentation

## 2022-03-18 DIAGNOSIS — R918 Other nonspecific abnormal finding of lung field: Secondary | ICD-10-CM | POA: Diagnosis not present

## 2022-03-18 DIAGNOSIS — C3490 Malignant neoplasm of unspecified part of unspecified bronchus or lung: Secondary | ICD-10-CM | POA: Diagnosis not present

## 2022-03-18 DIAGNOSIS — J449 Chronic obstructive pulmonary disease, unspecified: Secondary | ICD-10-CM | POA: Diagnosis not present

## 2022-03-18 LAB — GLUCOSE, CAPILLARY: Glucose-Capillary: 201 mg/dL — ABNORMAL HIGH (ref 70–99)

## 2022-03-18 MED ORDER — FLUDEOXYGLUCOSE F - 18 (FDG) INJECTION
6.3000 | Freq: Once | INTRAVENOUS | Status: AC | PRN
  Administered 2022-03-18: 6.82 via INTRAVENOUS

## 2022-03-20 ENCOUNTER — Telehealth: Payer: Self-pay

## 2022-03-20 DIAGNOSIS — C3491 Malignant neoplasm of unspecified part of right bronchus or lung: Secondary | ICD-10-CM

## 2022-03-20 NOTE — Telephone Encounter (Signed)
Called to notify patent of PET scan results and Dr. Collie Siad recommendation. Patient did not answer, left detailed message. Advised to call back with any questions or concerns.     [place], please schedule patient for: Ct chest w contrast in 6 moths  Lab/MD 2 days after PET. Please notify patient of appt. Thanks

## 2022-03-20 NOTE — Telephone Encounter (Signed)
-----   Message from Earlie Server, MD sent at 03/19/2022 11:51 PM EDT ----- Please let patient know that her PET scan result is good.  I recommend 6 months follow up with CT chest w contrast prior to lab MD - please order cbc cmp

## 2022-03-25 ENCOUNTER — Encounter: Payer: Self-pay | Admitting: Hematology and Oncology

## 2022-04-05 ENCOUNTER — Encounter: Payer: Self-pay | Admitting: Nurse Practitioner

## 2022-04-07 DIAGNOSIS — I442 Atrioventricular block, complete: Secondary | ICD-10-CM | POA: Diagnosis not present

## 2022-04-07 DIAGNOSIS — J449 Chronic obstructive pulmonary disease, unspecified: Secondary | ICD-10-CM | POA: Diagnosis not present

## 2022-04-07 DIAGNOSIS — I739 Peripheral vascular disease, unspecified: Secondary | ICD-10-CM | POA: Diagnosis not present

## 2022-04-07 DIAGNOSIS — Z950 Presence of cardiac pacemaker: Secondary | ICD-10-CM | POA: Diagnosis not present

## 2022-04-07 DIAGNOSIS — E782 Mixed hyperlipidemia: Secondary | ICD-10-CM | POA: Diagnosis not present

## 2022-04-07 DIAGNOSIS — J9621 Acute and chronic respiratory failure with hypoxia: Secondary | ICD-10-CM | POA: Diagnosis not present

## 2022-04-07 DIAGNOSIS — I5033 Acute on chronic diastolic (congestive) heart failure: Secondary | ICD-10-CM | POA: Diagnosis not present

## 2022-04-07 DIAGNOSIS — I700 Atherosclerosis of aorta: Secondary | ICD-10-CM | POA: Diagnosis not present

## 2022-04-07 DIAGNOSIS — E119 Type 2 diabetes mellitus without complications: Secondary | ICD-10-CM | POA: Diagnosis not present

## 2022-04-07 DIAGNOSIS — R519 Headache, unspecified: Secondary | ICD-10-CM | POA: Diagnosis not present

## 2022-04-07 DIAGNOSIS — G4730 Sleep apnea, unspecified: Secondary | ICD-10-CM | POA: Diagnosis not present

## 2022-04-07 DIAGNOSIS — I10 Essential (primary) hypertension: Secondary | ICD-10-CM | POA: Diagnosis not present

## 2022-04-09 DIAGNOSIS — J449 Chronic obstructive pulmonary disease, unspecified: Secondary | ICD-10-CM | POA: Diagnosis not present

## 2022-04-13 ENCOUNTER — Other Ambulatory Visit: Payer: Self-pay

## 2022-04-14 ENCOUNTER — Telehealth: Payer: Self-pay

## 2022-04-14 ENCOUNTER — Other Ambulatory Visit: Payer: Self-pay

## 2022-04-14 MED ORDER — HYDROCODONE-ACETAMINOPHEN 5-325 MG PO TABS: 1.0000 | ORAL_TABLET | Freq: Four times a day (QID) | ORAL | 0 refills | Status: DC | PRN

## 2022-04-14 NOTE — Telephone Encounter (Signed)
Lmom we send med  °

## 2022-04-14 NOTE — Telephone Encounter (Signed)
Lmom to pt that which med she need refills clarify

## 2022-04-18 DIAGNOSIS — J449 Chronic obstructive pulmonary disease, unspecified: Secondary | ICD-10-CM | POA: Diagnosis not present

## 2022-04-29 DIAGNOSIS — I5033 Acute on chronic diastolic (congestive) heart failure: Secondary | ICD-10-CM | POA: Diagnosis not present

## 2022-05-12 ENCOUNTER — Other Ambulatory Visit: Payer: Self-pay | Admitting: Nurse Practitioner

## 2022-05-12 ENCOUNTER — Other Ambulatory Visit (INDEPENDENT_AMBULATORY_CARE_PROVIDER_SITE_OTHER): Payer: Self-pay | Admitting: Nurse Practitioner

## 2022-05-12 DIAGNOSIS — E1165 Type 2 diabetes mellitus with hyperglycemia: Secondary | ICD-10-CM

## 2022-05-13 ENCOUNTER — Other Ambulatory Visit: Payer: Self-pay | Admitting: Nurse Practitioner

## 2022-05-13 ENCOUNTER — Other Ambulatory Visit (INDEPENDENT_AMBULATORY_CARE_PROVIDER_SITE_OTHER): Payer: Self-pay | Admitting: Nurse Practitioner

## 2022-05-13 DIAGNOSIS — E1165 Type 2 diabetes mellitus with hyperglycemia: Secondary | ICD-10-CM

## 2022-05-18 DIAGNOSIS — J449 Chronic obstructive pulmonary disease, unspecified: Secondary | ICD-10-CM | POA: Diagnosis not present

## 2022-05-26 ENCOUNTER — Encounter (INDEPENDENT_AMBULATORY_CARE_PROVIDER_SITE_OTHER): Payer: Self-pay | Admitting: Vascular Surgery

## 2022-05-26 ENCOUNTER — Other Ambulatory Visit (INDEPENDENT_AMBULATORY_CARE_PROVIDER_SITE_OTHER): Payer: Self-pay | Admitting: Nurse Practitioner

## 2022-05-26 ENCOUNTER — Ambulatory Visit (INDEPENDENT_AMBULATORY_CARE_PROVIDER_SITE_OTHER): Payer: Medicare Other

## 2022-05-26 ENCOUNTER — Ambulatory Visit (INDEPENDENT_AMBULATORY_CARE_PROVIDER_SITE_OTHER): Payer: Medicare Other | Admitting: Vascular Surgery

## 2022-05-26 VITALS — BP 115/76 | HR 89 | Resp 20 | Ht 59.0 in | Wt 123.0 lb

## 2022-05-26 DIAGNOSIS — I70213 Atherosclerosis of native arteries of extremities with intermittent claudication, bilateral legs: Secondary | ICD-10-CM

## 2022-05-26 DIAGNOSIS — Z794 Long term (current) use of insulin: Secondary | ICD-10-CM

## 2022-05-26 DIAGNOSIS — E7849 Other hyperlipidemia: Secondary | ICD-10-CM

## 2022-05-26 DIAGNOSIS — I10 Essential (primary) hypertension: Secondary | ICD-10-CM | POA: Diagnosis not present

## 2022-05-26 DIAGNOSIS — E0800 Diabetes mellitus due to underlying condition with hyperosmolarity without nonketotic hyperglycemic-hyperosmolar coma (NKHHC): Secondary | ICD-10-CM

## 2022-05-26 NOTE — Progress Notes (Signed)
MRN : [id number]  Angela Jensen is a 66 y.o. (04/08/1956) female who presents with chief complaint of No chief complaint on file. Marland Kitchen  History of Present Illness: Patient returns today in follow up of her PAD.  She is undergone multiple left lower extremity interventions in the past.  She is doing reasonably well today.  She remains on her supplemental oxygen and seems to be getting along well with that.  She denies any rest pain or ulceration.  Mild claudication symptoms. ABIs today are 0.95 on the right and 0.74 on the left which is stable to slightly improved since last visit.  Current Outpatient Medications  Medication Sig Dispense Refill   Accu-Chek Softclix Lancets lancets Use as instructed for twice a daily Dx E11.65 100 each 1   albuterol (PROVENTIL) (2.5 MG/3ML) 0.083% nebulizer solution USE 1 VIAL IN NEBULIZER EVERY 6 HOURS AS NEEDED FOR WHEEZING 1080 mL 1   albuterol (VENTOLIN HFA) 108 (90 Base) MCG/ACT inhaler INHALE 2 PUFFS BY MOUTH EVERY 6 HOURS AS NEEDED FOR WHEEZING OR SHORTNESS OF BREATH 18 g 3   ALPRAZolam (XANAX) 0.25 MG tablet TAKE 1 TABLET BY MOUTH THREE TIMES DAILY AS NEEDED FOR ANXIETY 90 tablet 2   Blood Glucose Monitoring Suppl (ACCU-CHEK GUIDE) w/Device KIT Use as directed Dx e11.65 1 kit 0   clopidogrel (PLAVIX) 75 MG tablet Take 1 tablet by mouth once daily 90 tablet 0   cyclobenzaprine (FLEXERIL) 10 MG tablet Take 1 tablet (10 mg total) by mouth at bedtime. Take one tab po qhs for back spasm prn only 30 tablet 1   escitalopram (LEXAPRO) 10 MG tablet Take 1 tablet (10 mg total) by mouth daily. 90 tablet 1   ferrous sulfate 325 (65 FE) MG tablet Take 325 mg by mouth 2 (two) times daily with a meal.     fluticasone (FLONASE) 50 MCG/ACT nasal spray Place 2 sprays into both nostrils daily. 9.9 mL 2   furosemide (LASIX) 20 MG tablet Take 20 mg by mouth daily as needed.     gabapentin (NEURONTIN) 100 MG capsule Take 1 capsule by mouth twice daily 90 capsule 0   glucose  blood (ACCU-CHEK GUIDE) test strip USE  STRIP TO CHECK GLUCOSE first thing in the morning, before meals and at bedtime (check glucose level a total of 5 times daily). 500 each 3   insulin detemir (LEVEMIR FLEXTOUCH) 100 UNIT/ML FlexPen Inject 12 Units into the skin daily with supper. Inject 8- 12 units with supper qd// with needles 6 mL 11   lisinopril-hydrochlorothiazide (ZESTORETIC) 20-25 MG tablet Take 1 tablet by mouth daily.     metFORMIN (GLUCOPHAGE) 500 MG tablet Take 2 tablets (1,000 mg total) by mouth daily with breakfast. 180 tablet 1   metoprolol tartrate (LOPRESSOR) 25 MG tablet Take 1/2 (one-half) tablet by mouth twice daily 90 tablet 1   mometasone-formoterol (DULERA) 200-5 MCG/ACT AERO Inhale 2 puffs into the lungs 2 (two) times daily. 13 g 5   Omega-3 Fatty Acids (FISH OIL) 1000 MG CAPS Take 1 capsule by mouth daily.     OXYGEN Inhale 4 L into the lungs. PT USES [facility] FOR OXYGEN     potassium chloride (KLOR-CON) 10 MEQ tablet TAKE 1 TABLET BY MOUTH EVERY OTHER DAY 45 tablet 3   tiotropium (SPIRIVA HANDIHALER) 18 MCG inhalation capsule INHALE 1 PUFF BY MOUTH ONCE DAILY. DO NOT SWALLOW CAPSULE 90 capsule 1   famotidine (PEPCID) 20 MG tablet Take 1 tablet (20  mg total) by mouth daily for 14 days. 14 tablet 0   HYDROcodone-acetaminophen (NORCO/VICODIN) 5-325 MG tablet Take 1 tablet by mouth every 6 (six) hours as needed for moderate pain or severe pain. (Patient not taking: Reported on 05/26/2022) 20 tablet 0   No current facility-administered medications for this visit.    Past Medical History:  Diagnosis Date   Anemia    Arthritis    Asthma    Atherosclerosis of native arteries of extremity with intermittent claudication ([place]) 05/26/2016   Cancer ([place]) 2012   Right Lung CA   COPD (chronic obstructive pulmonary disease) ([place])    Depression    Diabetes mellitus without complication ([place])    Patient takes Janumet   Essential hypertension 05/26/2016   Heart failure ([place])  2022   Hypercholesteremia    Oxygen dependent    2L at nite    PAD (peripheral artery disease) ([place]) 06/22/2016   Peripheral vascular disease (HCC)    Personal history of radiation therapy    Shortness of breath dyspnea    with exertion    Sialolithiasis    Sleep apnea    Wears dentures    full upper and lower    Past Surgical History:  Procedure Laterality Date   CESAREAN SECTION     x3   COLONOSCOPY WITH PROPOFOL N/A 06/25/2015   Procedure: COLONOSCOPY WITH PROPOFOL;  Surgeon: Lucilla Lame, MD;  Location: [facility];  Service: Endoscopy;  Laterality: N/A;   COLONOSCOPY WITH PROPOFOL N/A 07/26/2020   Procedure: COLONOSCOPY WITH PROPOFOL;  Surgeon: Lucilla Lame, MD;  Location: [place];  Service: Endoscopy;  Laterality: N/A;   CYST REMOVAL LEG     and on shoulder    ESOPHAGOGASTRODUODENOSCOPY (EGD) WITH PROPOFOL N/A 07/26/2020   Procedure: ESOPHAGOGASTRODUODENOSCOPY (EGD) WITH PROPOFOL;  Surgeon: Lucilla Lame, MD;  Location: [place];  Service: Endoscopy;  Laterality: N/A;  Diabetic - oral meds   LOWER EXTREMITY ANGIOGRAPHY Left 09/29/2018   Procedure: LOWER EXTREMITY ANGIOGRAPHY;  Surgeon: Algernon Huxley, MD;  Location: [place] [facility];  Service: Cardiovascular;  Laterality: Left;   LUNG BIOPSY  12/30/2011   has lung "spots"   PACEMAKER IMPLANT  07/14/2021   PACEMAKER LEADLESS INSERTION N/A 07/14/2021   Procedure: PACEMAKER LEADLESS INSERTION;  Surgeon: Isaias Cowman, MD;  Location: [place] [facility];  Service: Cardiovascular;  Laterality: N/A;   PERIPHERAL VASCULAR CATHETERIZATION Left 06/01/2016   Procedure: Lower Extremity Angiography;  Surgeon: Algernon Huxley, MD;  Location: [place] [facility];  Service: Cardiovascular;  Laterality: Left;   PERIPHERAL VASCULAR CATHETERIZATION N/A 06/01/2016   Procedure: Abdominal Aortogram w/Lower Extremity;  Surgeon: Algernon Huxley, MD;  Location: [place] [facility];  Service: Cardiovascular;  Laterality:  N/A;   PERIPHERAL VASCULAR CATHETERIZATION  06/01/2016   Procedure: Lower Extremity Intervention;  Surgeon: Algernon Huxley, MD;  Location: [place] [facility];  Service: Cardiovascular;;   PERIPHERAL VASCULAR CATHETERIZATION Right 06/08/2016   Procedure: Lower Extremity Angiography;  Surgeon: Algernon Huxley, MD;  Location: [place] [facility];  Service: Cardiovascular;  Laterality: Right;   PERIPHERAL VASCULAR CATHETERIZATION  06/08/2016   Procedure: Lower Extremity Intervention;  Surgeon: Algernon Huxley, MD;  Location: [place] [facility];  Service: Cardiovascular;;   SUBMANDIBULAR GLAND EXCISION Left 12/06/2020   Procedure: EXCISION SUBMANDIBULAR GLAND;  Surgeon: Beverly Gust, MD;  Location: [place];  Service: ENT;  Laterality: Left;  needs to be first case Diabetic - diet controlled  TEMPORARY PACEMAKER N/A 07/11/2021   Procedure: TEMPORARY PACEMAKER;  Surgeon: Isaias Cowman, MD;  Location: [place] [facility];  Service: Cardiovascular;  Laterality: N/A;     Social History   Tobacco Use   Smoking status: Former    Packs/day: 1.00    Years: 37.00    Total pack years: 37.00    Types: Cigarettes    Quit date: 02/06/2010    Years since quitting: 12.3   Smokeless tobacco: Former    Types: Snuff  Vaping Use   Vaping Use: Never used  Substance Use Topics   Alcohol use: Not Currently    Alcohol/week: 5.0 standard drinks of alcohol    Types: 5 Cans of beer per week    Comment: /h x of alcohol abuse -stopped 2012- now drinks 5 beer per week   Drug use: Not Currently    Types: Marijuana, "Crack" cocaine, Cocaine    Comment: hx of cocaine use- last use 2015; last use marijuana6/22/19,      Family History  Problem Relation Age of Onset   Lung cancer Father    Diabetes Mother    Hypercholesterolemia Mother    Diabetes Sister    Diabetes Maternal Grandmother    Diabetes Paternal Grandmother    Diabetes Sister    Heart attack Brother    Coronary artery disease  Brother    Vascular Disease Brother    Hypertension Sister    Heart attack Brother      Allergies  Allergen Reactions   Trelegy Ellipta [Fluticasone-Umeclidin-Vilant]     Had breathing issues    REVIEW OF SYSTEMS (Negative unless checked)   Constitutional: '[]' Weight loss  '[]' Fever  '[]' Chills Cardiac: '[]' Chest pain   '[]' Chest pressure   '[]' Palpitations   '[]' Shortness of breath when laying flat   '[]' Shortness of breath at rest   '[x]' Shortness of breath with exertion. Vascular:  '[x]' Pain in legs with walking   '[]' Pain in legs at rest   '[]' Pain in legs when laying flat   '[x]' Claudication   '[]' Pain in feet when walking  '[]' Pain in feet at rest  '[]' Pain in feet when laying flat   '[]' History of DVT   '[]' Phlebitis   '[]' Swelling in legs   '[]' Varicose veins   '[]' Non-healing ulcers Pulmonary:   '[x]' Uses home oxygen   '[]' Productive cough   '[]' Hemoptysis   '[]' Wheeze  '[x]' COPD   '[]' Asthma Neurologic:  '[]' Dizziness  '[]' Blackouts   '[]' Seizures   '[]' History of stroke   '[]' History of TIA  '[]' Aphasia   '[]' Temporary blindness   '[]' Dysphagia   '[]' Weakness or numbness in arms   '[]' Weakness or numbness in legs Musculoskeletal:  '[x]' Arthritis   '[]' Joint swelling   '[]' Joint pain   '[]' Low back pain Hematologic:  '[]' Easy bruising  '[]' Easy bleeding   '[]' Hypercoagulable state   '[]' Anemic  '[]' Hepatitis Gastrointestinal:  '[]' Blood in stool   '[]' Vomiting blood  '[]' Gastroesophageal reflux/heartburn   '[]' Abdominal pain Genitourinary:  '[]' Chronic kidney disease   '[]' Difficult urination  '[]' Frequent urination  '[]' Burning with urination   '[]' Hematuria Skin:  '[]' Rashes   '[]' Ulcers   '[]' Wounds Psychological:  '[]' History of anxiety   '[]'  History of major depression.  Physical Examination  BP 115/76 (BP Location: Right Arm)   Pulse 89   Resp 20   Ht '4\' 11"'  (1.499 m)   Wt 123 lb (55.8 kg)   BMI 24.84 kg/m  Gen:  WD/WN, NAD Head: [place]/AT, No temporalis wasting. Ear/Nose/Throat: Hearing grossly intact, nares w/o erythema or drainage Eyes: Conjunctiva clear. Sclera  non-icteric Neck:  Supple.  Trachea midline Pulmonary:  Good air movement, no use of accessory muscles on supplemental oxygen.  Cardiac: RRR, no JVD Vascular:  Vessel Right Left  Radial Palpable Palpable                          PT 2+ Palpable 1+ Palpable  DP 2+ Palpable 1+ Palpable   Gastrointestinal: soft, non-tender/non-distended. No guarding/reflex.  Musculoskeletal: M/S 5/5 throughout.  No deformity or atrophy. No edema. Neurologic: Sensation grossly intact in extremities.  Symmetrical.  Speech is fluent.  Psychiatric: Judgment intact, Mood & affect appropriate for pt's clinical situation. Dermatologic: No rashes or ulcers noted.  No cellulitis or open wounds.      Labs Recent Results (from the past 2160 hour(s))  POCT HgB A1C     Status: Abnormal   Collection Time: 03/10/22 10:51 AM  Result Value Ref Range   Hemoglobin A1C 8.6 (A) 4.0 - 5.6 %   HbA1c POC (<> result, manual entry)     HbA1c, POC (prediabetic range)     HbA1c, POC (controlled diabetic range)    Glucose, capillary     Status: Abnormal   Collection Time: 03/18/22 10:22 AM  Result Value Ref Range   Glucose-Capillary 201 (H) 70 - 99 mg/dL    Comment: Glucose reference range applies only to samples taken after fasting for at least 8 hours.    Radiology No results found.  Assessment/Plan Essential hypertension blood pressure control important in reducing the progression of atherosclerotic disease. On appropriate oral medications.     Diabetes ([place]) blood glucose control important in reducing the progression of atherosclerotic disease. Also, involved in wound healing. On appropriate medications.     Hyperlipidemia lipid control important in reducing the progression of atherosclerotic disease. Continue statin therapy  Atherosclerosis of native arteries of extremity with intermittent claudication (HCC) ABIs today are 0.95 on the right and 0.74 on the left which is stable to slightly improved since  last visit.  Symptoms are currently very mild.  No role for intervention.  Recheck in 1 year.  No change in medical regimen.    [name], MD  05/26/2022 2:35 PM    This note was created with Dragon medical transcription system.  Any errors from dictation are purely unintentional

## 2022-05-26 NOTE — Assessment & Plan Note (Signed)
ABIs today are 0.95 on the right and 0.74 on the left which is stable to slightly improved since last visit.  Symptoms are currently very mild.  No role for intervention.  Recheck in 1 year.  No change in medical regimen.

## 2022-06-05 ENCOUNTER — Other Ambulatory Visit: Payer: Self-pay

## 2022-06-05 ENCOUNTER — Encounter: Payer: Self-pay | Admitting: Hematology and Oncology

## 2022-06-05 ENCOUNTER — Emergency Department
Admission: EM | Admit: 2022-06-05 | Discharge: 2022-06-06 | Disposition: A | Discharge status: Home / Self Care | Payer: Medicare Other | Attending: Emergency Medicine | Admitting: Emergency Medicine

## 2022-06-05 ENCOUNTER — Emergency Department: Payer: Medicare Other

## 2022-06-05 DIAGNOSIS — R1011 Right upper quadrant pain: Secondary | ICD-10-CM

## 2022-06-05 DIAGNOSIS — R079 Chest pain, unspecified: Secondary | ICD-10-CM | POA: Diagnosis not present

## 2022-06-05 DIAGNOSIS — N281 Cyst of kidney, acquired: Secondary | ICD-10-CM | POA: Diagnosis not present

## 2022-06-05 DIAGNOSIS — R1013 Epigastric pain: Secondary | ICD-10-CM | POA: Insufficient documentation

## 2022-06-05 DIAGNOSIS — R110 Nausea: Secondary | ICD-10-CM | POA: Insufficient documentation

## 2022-06-05 DIAGNOSIS — J449 Chronic obstructive pulmonary disease, unspecified: Secondary | ICD-10-CM | POA: Insufficient documentation

## 2022-06-05 DIAGNOSIS — R109 Unspecified abdominal pain: Secondary | ICD-10-CM | POA: Diagnosis not present

## 2022-06-05 DIAGNOSIS — R0789 Other chest pain: Secondary | ICD-10-CM | POA: Diagnosis not present

## 2022-06-05 LAB — BASIC METABOLIC PANEL
Anion gap: 9 (ref 5–15)
BUN: 33 mg/dL — ABNORMAL HIGH (ref 8–23)
CO2: 27 mmol/L (ref 22–32)
Calcium: 8.9 mg/dL (ref 8.9–10.3)
Chloride: 99 mmol/L (ref 98–111)
Creatinine, Ser: 0.85 mg/dL (ref 0.44–1.00)
GFR, Estimated: >60 mL/min (ref >60)
Glucose, Bld: 204 mg/dL — ABNORMAL HIGH (ref 70–99)
Potassium: 3.3 mmol/L — ABNORMAL LOW (ref 3.5–5.1)
Sodium: 135 mmol/L (ref 135–145)

## 2022-06-05 LAB — CBC
HCT: 32.5 % — ABNORMAL LOW (ref 36.0–46.0)
Hemoglobin: 9.7 g/dL — ABNORMAL LOW (ref 12.0–15.0)
MCH: 28.3 pg (ref 26.0–34.0)
MCHC: 29.8 g/dL — ABNORMAL LOW (ref 30.0–36.0)
MCV: 94.8 fL (ref 80.0–100.0)
Platelets: 266 10*3/uL (ref 150–400)
RBC: 3.43 MIL/uL — ABNORMAL LOW (ref 3.87–5.11)
RDW: 14 % (ref 11.5–15.5)
WBC: 6 10*3/uL (ref 4.0–10.5)
nRBC: 0 % (ref 0.0–0.2)

## 2022-06-05 LAB — TROPONIN I (HIGH SENSITIVITY): Troponin I (High Sensitivity): 7 ng/L (ref <18)

## 2022-06-05 NOTE — ED Triage Notes (Addendum)
Pt reports 2 weeks of painful inhalation and pain in her back and under her right breast.  Pt states she wears 2.5 to 3 L of O2 continuously at home, COPD  No fevers/chills at home.  No n/v/d.  Orthopneic.

## 2022-06-06 ENCOUNTER — Emergency Department: Payer: Medicare Other

## 2022-06-06 DIAGNOSIS — R079 Chest pain, unspecified: Secondary | ICD-10-CM | POA: Diagnosis not present

## 2022-06-06 DIAGNOSIS — R109 Unspecified abdominal pain: Secondary | ICD-10-CM | POA: Diagnosis not present

## 2022-06-06 DIAGNOSIS — R1011 Right upper quadrant pain: Secondary | ICD-10-CM | POA: Diagnosis not present

## 2022-06-06 DIAGNOSIS — N281 Cyst of kidney, acquired: Secondary | ICD-10-CM | POA: Diagnosis not present

## 2022-06-06 LAB — HEPATIC FUNCTION PANEL
ALT: 20 U/L (ref 0–44)
AST: 26 U/L (ref 15–41)
Albumin: 3.7 g/dL (ref 3.5–5.0)
Alkaline Phosphatase: 62 U/L (ref 38–126)
Bilirubin, Direct: <0.1 mg/dL (ref 0.0–0.2)
Total Bilirubin: 0.2 mg/dL — ABNORMAL LOW (ref 0.3–1.2)
Total Protein: 7.1 g/dL (ref 6.5–8.1)

## 2022-06-06 LAB — LIPASE, BLOOD: Lipase: 23 U/L (ref 11–51)

## 2022-06-06 LAB — TROPONIN I (HIGH SENSITIVITY): Troponin I (High Sensitivity): 6 ng/L (ref <18)

## 2022-06-06 MED ORDER — IPRATROPIUM-ALBUTEROL 0.5-2.5 (3) MG/3ML IN SOLN
6.0000 mL | Freq: Once | RESPIRATORY_TRACT | Status: AC
  Administered 2022-06-06: 6 mL via RESPIRATORY_TRACT
  Filled 2022-06-06: qty 3

## 2022-06-06 MED ORDER — PREDNISONE 20 MG PO TABS
60.0000 mg | ORAL_TABLET | Freq: Once | ORAL | Status: AC
  Administered 2022-06-06: 60 mg via ORAL
  Filled 2022-06-06: qty 3

## 2022-06-06 MED ORDER — IOHEXOL 350 MG/ML SOLN
75.0000 mL | Freq: Once | INTRAVENOUS | Status: AC | PRN
  Administered 2022-06-06: 75 mL via INTRAVENOUS

## 2022-06-06 MED ORDER — ONDANSETRON HCL 4 MG/2ML IJ SOLN
4.0000 mg | INTRAMUSCULAR | Status: AC
  Administered 2022-06-06: 4 mg via INTRAVENOUS
  Filled 2022-06-06: qty 2

## 2022-06-06 MED ORDER — MORPHINE SULFATE (PF) 4 MG/ML IV SOLN
4.0000 mg | Freq: Once | INTRAVENOUS | Status: AC
  Administered 2022-06-06: 4 mg via INTRAVENOUS
  Filled 2022-06-06: qty 1

## 2022-06-06 NOTE — ED Provider Notes (Signed)
[facility] Provider Note    Event Date/Time   First MD Initiated Contact with Patient 06/06/22 [phone]     (approximate)   History   Chest Pain   HPI  Angela Jensen is a 66 y.o. female with COPD with supplemental oxygen requirement at home.  She presents for "several weeks" of pain in her right upper quadrant or lower part of her chest.  She said that hurts when she moves around and pushes on it.  Eating and drinking does not seem to make a difference.  It hurts more when she takes a deep breath.  When asked why she came in tonight after having pain for 1 to 2 weeks, she says she just did not want to deal with it anymore.  She has had no recent trauma.  Sometimes the pain radiates through to her back.  She has had some nausea but no vomiting and no lower abdominal pain.  No recent fever.  No new or different medications.     Physical Exam   Triage Vital Signs: ED Triage Vitals  Enc Vitals Group     BP 06/05/22 2038 115/60     Pulse Rate 06/05/22 2038 79     Resp 06/05/22 2038 20     Temp 06/05/22 2038 98.3 F (36.8 C)     Temp src --      SpO2 06/05/22 2038 91 %     Weight --      Height --      Head Circumference --      Peak Flow --      Pain Score 06/05/22 2039 6     Pain Loc --      Pain Edu? --      Excl. in [place]? --     Most recent vital signs: Vitals:   06/06/22 0030 06/06/22 0309  BP: 118/70 126/79  Pulse: 71 78  Resp: 18 17  Temp:  97.8 F (36.6 C)  SpO2: 100% 98%     General: Awake, no distress.  CV:  Good peripheral perfusion.  Normal heart sounds, regular rate and rhythm. Resp:  Normal effort.  Lungs are clear to auscultation bilaterally.  She is on supplemental oxygen at her baseline requirement.  She reports pain in the right lower chest when she takes deep breaths. Abd:  No distention.  No lower abdominal tenderness to palpation and no rebound or guarding.  She has minimal epigastric tenderness to palpation but has  substantial right upper quadrant tenderness with positive Murphy sign. Other:  Mood and affect are normal and appropriate.  No focal neurological deficits appreciated.   ED Results / Procedures / Treatments   Labs (all labs ordered are listed, but only abnormal results are displayed) Labs Reviewed  BASIC METABOLIC PANEL - Abnormal; Notable for the following components:      Result Value   Potassium 3.3 (*)    Glucose, Bld 204 (*)    BUN 33 (*)    All other components within normal limits  CBC - Abnormal; Notable for the following components:   RBC 3.43 (*)    Hemoglobin 9.7 (*)    HCT 32.5 (*)    MCHC 29.8 (*)    All other components within normal limits  HEPATIC FUNCTION PANEL - Abnormal; Notable for the following components:   Total Bilirubin 0.2 (*)    All other components within normal limits  LIPASE, BLOOD  TROPONIN I (HIGH SENSITIVITY)  TROPONIN I (HIGH SENSITIVITY)     EKG  ED ECG REPORT I, Hinda Kehr, the attending physician, personally viewed and interpreted this ECG.  Date: 06/05/2022 EKG Time: 20: 43 Rate: 79 Rhythm: normal sinus rhythm QRS Axis: normal Intervals: Bifascicular block ST/T Wave abnormalities: Non-specific ST segment / T-wave changes, but no clear evidence of acute ischemia. Narrative Interpretation: no definitive evidence of acute ischemia; does not meet STEMI criteria.    RADIOLOGY I viewed and interpreted the patient's two-view chest x-ray.  I see no specific abnormalities.  The radiologist mentions some chronic changes but nothing acute or different today to explain the patient's pain.    PROCEDURES:  Critical Care performed: No  Procedures   MEDICATIONS ORDERED IN ED: Medications  ipratropium-albuterol (DUONEB) 0.5-2.5 (3) MG/3ML nebulizer solution 6 mL (6 mLs Nebulization Given 06/06/22 0028)  predniSONE (DELTASONE) tablet 60 mg (60 mg Oral Given 06/06/22 0029)  morphine (PF) 4 MG/ML injection 4 mg (4 mg Intravenous Given  06/06/22 0119)  ondansetron (ZOFRAN) injection 4 mg (4 mg Intravenous Given 06/06/22 0119)  iohexol (OMNIPAQUE) 350 MG/ML injection 75 mL (75 mLs Intravenous Contrast Given 06/06/22 0218)     IMPRESSION / MDM / ASSESSMENT AND PLAN / ED COURSE  I reviewed the triage vital signs and the nursing notes.                              Differential diagnosis includes, but is not limited to, biliary colic or other gallbladder disease, pancreatitis, gastritis, PE, pneumonia, rib fracture, less likely ACS.  Patient's presentation is most consistent with acute presentation with potential threat to life or bodily function.  Labs/studies ordered: EKG, two-view chest x-ray, CBC, basic metabolic panel, lipase, high-sensitivity troponin x2, hepatic function panel.  Patient is not in substantial distress although she reports pain and has significant tenderness to palpation of the right upper quadrant.  Lab results are all essentially normal.  No leukocytosis, normal renal function.  Given the specific tenderness to palpation of the right upper quadrant, I ordered an ultrasound of the right upper quadrant and gallbladder/liver.  For her ongoing symptoms, I ordered morphine 4 mg IV and Zofran 4 mg IV.  Her COPD seems chronic and unchanged from prior, no indication of acute exacerbation.   Clinical Course as of 06/06/22 0559  Sat Jun 06, 2022  0202 US ABDOMEN LIMITED RUQ (LIVER/GB) I viewed and interpreted the patient's ultrasound.  I see no evidence of cholelithiasis or cholecystitis.  Radiology agrees and reports that the scan is normal.  Given the patient's pain and comorbidities, I feel it is necessary to rule out potentially life-threatening conditions including PE and intra-abdominal condition such as partial SBO/ileus.  I discussed this with her and she agrees with the plan.  I ordered a CTA chest to rule out PE and CT abdomen/pelvis for further evaluation of possible intra-abdominal infection or  obstruction. [CF]  0204 Of note, the second high-sensitivity troponin is within normal limits, as is lipase and hepatic function panel. [CF]  0330 I viewed and interpreted the patient's CTA chest and CT abdomen/pelvis.  I see no evidence of acute intra-abdominal pathology nor pulmonary embolism.  The radiologist agreed, no acute findings, no evidence of PE.  Chronic changes to the lungs but no explanation for symptoms.  I updated the patient about the reassuring results and no need to stay in the hospital.  I encouraged close outpatient follow-up and gave my  usual and customary return precautions. [CF]    Clinical Course User Index [CF] Hinda Kehr, MD     FINAL CLINICAL IMPRESSION(S) / ED DIAGNOSES   Final diagnoses:  RUQ pain  Atypical chest pain     Rx / DC Orders   ED Discharge Orders     None        Note:  This document was prepared using Dragon voice recognition software and may include unintentional dictation errors.   Hinda Kehr, MD 06/06/22 [phone]

## 2022-06-06 NOTE — ED Notes (Signed)
Patient discharged at this time. Wheeled to lobby and assisted into vehicle. Breathing unlabored speaking in full sentences. Verbalized understanding of all discharge, follow up, and medication teaching. Discharged homed with all belongings.   

## 2022-06-06 NOTE — Discharge Instructions (Signed)
Your workup in the Emergency Department today was reassuring.  We did not find any specific abnormalities.  We recommend you drink plenty of fluids, take your regular medications and/or any new ones prescribed today, and follow up with the doctor(s) listed in these documents as recommended.  Return to the Emergency Department if you develop new or worsening symptoms that concern you.  

## 2022-06-09 ENCOUNTER — Encounter: Payer: Self-pay | Admitting: Nurse Practitioner

## 2022-06-09 ENCOUNTER — Ambulatory Visit (INDEPENDENT_AMBULATORY_CARE_PROVIDER_SITE_OTHER): Payer: Medicare Other | Admitting: Nurse Practitioner

## 2022-06-09 VITALS — BP 119/62 | HR 111 | Temp 98.4°F | Resp 16 | Ht 59.0 in | Wt 124.6 lb

## 2022-06-09 DIAGNOSIS — E1165 Type 2 diabetes mellitus with hyperglycemia: Secondary | ICD-10-CM | POA: Diagnosis not present

## 2022-06-09 DIAGNOSIS — Z760 Encounter for issue of repeat prescription: Secondary | ICD-10-CM | POA: Diagnosis not present

## 2022-06-09 DIAGNOSIS — L048 Acute lymphadenitis of other sites: Secondary | ICD-10-CM

## 2022-06-09 DIAGNOSIS — F419 Anxiety disorder, unspecified: Secondary | ICD-10-CM

## 2022-06-09 LAB — POCT GLYCOSYLATED HEMOGLOBIN (HGB A1C): Hemoglobin A1C: 6.6 % — AB (ref 4.0–5.6)

## 2022-06-09 MED ORDER — DICLOFENAC SODIUM 75 MG PO TBEC: 75.0000 mg | DELAYED_RELEASE_TABLET | Freq: Two times a day (BID) | ORAL | 2 refills | Status: DC

## 2022-06-09 MED ORDER — DEXCOM G7 RECEIVER DEVI: 0 refills | Status: DC

## 2022-06-09 MED ORDER — DEXCOM G7 SENSOR MISC: 11 refills | Status: DC

## 2022-06-09 MED ORDER — HYDROCODONE-ACETAMINOPHEN 7.5-325 MG PO TABS: 1.0000 | ORAL_TABLET | Freq: Four times a day (QID) | ORAL | 0 refills | Status: DC | PRN

## 2022-06-09 MED ORDER — ESCITALOPRAM OXALATE 10 MG PO TABS: 10.0000 mg | ORAL_TABLET | Freq: Every day | ORAL | 1 refills | Status: DC

## 2022-06-09 MED ORDER — FUROSEMIDE 20 MG PO TABS: 20.0000 mg | ORAL_TABLET | Freq: Every day | ORAL | 5 refills | Status: DC | PRN

## 2022-06-09 MED ORDER — ALPRAZOLAM 0.25 MG PO TABS: ORAL_TABLET | ORAL | 2 refills | Status: DC

## 2022-06-09 MED ORDER — DOXYCYCLINE HYCLATE 100 MG PO TABS: 100.0000 mg | ORAL_TABLET | Freq: Two times a day (BID) | ORAL | 0 refills | Status: DC

## 2022-06-09 NOTE — Progress Notes (Signed)
[facility] [place], [place] [address]  Internal MEDICINE  Office Visit Note  Patient Name: Angela Jensen  [id number]  [id number]  Date of Service: 06/09/2022  Chief Complaint  Patient presents with   Follow-up   Hyperlipidemia   Hypertension   Diabetes   Depression    HPI Angela Jensen presents for follow up visit for diabetes, med refills and right UQ abdominal pain Diabetes --greatly improved A1c at 6.6, down from 8.6 in July. No recent changes in medications. Has been working on eating better and walking. Using Dexcom G7, needs new receiver.  Refills -several medications are due RUQ pain and right flank/back pain -- swollen tender nodules wrapping around her right side in this area.      Current Medication: Outpatient Encounter Medications as of 06/09/2022  Medication Sig   Accu-Chek Softclix Lancets lancets Use as instructed for twice a daily Dx E11.65   albuterol (VENTOLIN HFA) 108 (90 Base) MCG/ACT inhaler INHALE 2 PUFFS BY MOUTH EVERY 6 HOURS AS NEEDED FOR WHEEZING OR SHORTNESS OF BREATH   Blood Glucose Monitoring Suppl (ACCU-CHEK GUIDE) w/Device KIT Use as directed Dx e11.65   clopidogrel (PLAVIX) 75 MG tablet Take 1 tablet by mouth once daily   Continuous Blood Gluc Receiver (DEXCOM G7 RECEIVER) DEVI Use one as directed for uncontrolled dm   cyclobenzaprine (FLEXERIL) 10 MG tablet Take 1 tablet (10 mg total) by mouth at bedtime. Take one tab po qhs for back spasm prn only   diclofenac (VOLTAREN) 75 MG EC tablet Take 1 tablet (75 mg total) by mouth 2 (two) times daily.   doxycycline (VIBRA-TABS) 100 MG tablet Take 1 tablet (100 mg total) by mouth 2 (two) times daily.   ferrous sulfate 325 (65 FE) MG tablet Take 325 mg by mouth 2 (two) times daily with a meal.   fluticasone (FLONASE) 50 MCG/ACT nasal spray Place 2 sprays into both nostrils daily.   glucose blood (ACCU-CHEK GUIDE) test strip USE  STRIP TO CHECK GLUCOSE first thing in the morning, before  meals and at bedtime (check glucose level a total of 5 times daily).   HYDROcodone-acetaminophen (NORCO) 7.5-325 MG tablet Take 1 tablet by mouth every 6 (six) hours as needed for severe pain.   lisinopril-hydrochlorothiazide (ZESTORETIC) 20-25 MG tablet Take 1 tablet by mouth daily.   metFORMIN (GLUCOPHAGE) 500 MG tablet Take 2 tablets (1,000 mg total) by mouth daily with breakfast.   metoprolol tartrate (LOPRESSOR) 25 MG tablet Take 1/2 (one-half) tablet by mouth twice daily   Omega-3 Fatty Acids (FISH OIL) 1000 MG CAPS Take 1 capsule by mouth daily.   OXYGEN Inhale 4 L into the lungs. PT USES [facility] FOR OXYGEN   potassium chloride (KLOR-CON) 10 MEQ tablet TAKE 1 TABLET BY MOUTH EVERY OTHER DAY   [DISCONTINUED] albuterol (PROVENTIL) (2.5 MG/3ML) 0.083% nebulizer solution USE 1 VIAL IN NEBULIZER EVERY 6 HOURS AS NEEDED FOR WHEEZING   [DISCONTINUED] ALPRAZolam (XANAX) 0.25 MG tablet TAKE 1 TABLET BY MOUTH THREE TIMES DAILY AS NEEDED FOR ANXIETY   [DISCONTINUED] Continuous Blood Gluc Sensor (DEXCOM G7 SENSOR) MISC Apply 1 sensor to skin on designated areas every 10 days to monitor glucose levels with sensor or smart phone app   [DISCONTINUED] escitalopram (LEXAPRO) 10 MG tablet Take 1 tablet (10 mg total) by mouth daily.   [DISCONTINUED] furosemide (LASIX) 20 MG tablet Take 20 mg by mouth daily as needed.   [DISCONTINUED] gabapentin (NEURONTIN) 100 MG capsule Take 1 capsule by  mouth twice daily   [DISCONTINUED] HYDROcodone-acetaminophen (NORCO/VICODIN) 5-325 MG tablet Take 1 tablet by mouth every 6 (six) hours as needed for moderate pain or severe pain.   [DISCONTINUED] insulin detemir (LEVEMIR FLEXTOUCH) 100 UNIT/ML FlexPen Inject 12 Units into the skin daily with supper. Inject 8- 12 units with supper qd// with needles   [DISCONTINUED] mometasone-formoterol (DULERA) 200-5 MCG/ACT AERO Inhale 2 puffs into the lungs 2 (two) times daily.   [DISCONTINUED] tiotropium (SPIRIVA HANDIHALER) 18 MCG  inhalation capsule INHALE 1 PUFF BY MOUTH ONCE DAILY. DO NOT SWALLOW CAPSULE   ALPRAZolam (XANAX) 0.25 MG tablet TAKE 1 TABLET BY MOUTH THREE TIMES DAILY AS NEEDED FOR ANXIETY   escitalopram (LEXAPRO) 10 MG tablet Take 1 tablet (10 mg total) by mouth daily.   famotidine (PEPCID) 20 MG tablet Take 1 tablet (20 mg total) by mouth daily for 14 days.   furosemide (LASIX) 20 MG tablet Take 1 tablet (20 mg total) by mouth daily as needed.   No facility-administered encounter medications on file as of 06/09/2022.    Surgical History: Past Surgical History:  Procedure Laterality Date   CESAREAN SECTION     x3   COLONOSCOPY WITH PROPOFOL N/A 06/25/2015   Procedure: COLONOSCOPY WITH PROPOFOL;  Surgeon: Lucilla Lame, MD;  Location: [facility];  Service: Endoscopy;  Laterality: N/A;   COLONOSCOPY WITH PROPOFOL N/A 07/26/2020   Procedure: COLONOSCOPY WITH PROPOFOL;  Surgeon: Lucilla Lame, MD;  Location: [place];  Service: Endoscopy;  Laterality: N/A;   CYST REMOVAL LEG     and on shoulder    ESOPHAGOGASTRODUODENOSCOPY (EGD) WITH PROPOFOL N/A 07/26/2020   Procedure: ESOPHAGOGASTRODUODENOSCOPY (EGD) WITH PROPOFOL;  Surgeon: Lucilla Lame, MD;  Location: [place];  Service: Endoscopy;  Laterality: N/A;  Diabetic - oral meds   LOWER EXTREMITY ANGIOGRAPHY Left 09/29/2018   Procedure: LOWER EXTREMITY ANGIOGRAPHY;  Surgeon: Algernon Huxley, MD;  Location: [place] [facility];  Service: Cardiovascular;  Laterality: Left;   LUNG BIOPSY  12/30/2011   has lung "spots"   PACEMAKER IMPLANT  07/14/2021   PACEMAKER LEADLESS INSERTION N/A 07/14/2021   Procedure: PACEMAKER LEADLESS INSERTION;  Surgeon: Isaias Cowman, MD;  Location: [place] [facility];  Service: Cardiovascular;  Laterality: N/A;   PERIPHERAL VASCULAR CATHETERIZATION Left 06/01/2016   Procedure: Lower Extremity Angiography;  Surgeon: Algernon Huxley, MD;  Location: [place] [facility];  Service: Cardiovascular;   Laterality: Left;   PERIPHERAL VASCULAR CATHETERIZATION N/A 06/01/2016   Procedure: Abdominal Aortogram w/Lower Extremity;  Surgeon: Algernon Huxley, MD;  Location: [place] [facility];  Service: Cardiovascular;  Laterality: N/A;   PERIPHERAL VASCULAR CATHETERIZATION  06/01/2016   Procedure: Lower Extremity Intervention;  Surgeon: Algernon Huxley, MD;  Location: [place] [facility];  Service: Cardiovascular;;   PERIPHERAL VASCULAR CATHETERIZATION Right 06/08/2016   Procedure: Lower Extremity Angiography;  Surgeon: Algernon Huxley, MD;  Location: [place] [facility];  Service: Cardiovascular;  Laterality: Right;   PERIPHERAL VASCULAR CATHETERIZATION  06/08/2016   Procedure: Lower Extremity Intervention;  Surgeon: Algernon Huxley, MD;  Location: [place] [facility];  Service: Cardiovascular;;   SUBMANDIBULAR GLAND EXCISION Left 12/06/2020   Procedure: EXCISION SUBMANDIBULAR GLAND;  Surgeon: Beverly Gust, MD;  Location: [place];  Service: ENT;  Laterality: Left;  needs to be first case Diabetic - diet controlled   TEMPORARY PACEMAKER N/A 07/11/2021   Procedure: TEMPORARY PACEMAKER;  Surgeon: Isaias Cowman, MD;  Location: [place] [facility];  Service: Cardiovascular;  Laterality: N/A;    Medical History: Past Medical History:  Diagnosis Date   Anemia    Arthritis    Asthma    Atherosclerosis of native arteries of extremity with intermittent claudication ([place]) 05/26/2016   Cancer ([place]) 2012   Right Lung CA   COPD (chronic obstructive pulmonary disease) ([place])    Depression    Diabetes mellitus without complication ([place])    Patient takes Janumet   Essential hypertension 05/26/2016   Heart failure ([place]) 2022   Hypercholesteremia    Oxygen dependent    2L at nite    PAD (peripheral artery disease) ([place]) 06/22/2016   Peripheral vascular disease (HCC)    Personal history of radiation therapy    Shortness of breath dyspnea    with exertion    Sialolithiasis    Sleep apnea     Wears dentures    full upper and lower    Family History: Family History  Problem Relation Age of Onset   Lung cancer Father    Diabetes Mother    Hypercholesterolemia Mother    Diabetes Sister    Diabetes Maternal Grandmother    Diabetes Paternal Grandmother    Diabetes Sister    Heart attack Brother    Coronary artery disease Brother    Vascular Disease Brother    Hypertension Sister    Heart attack Brother     Social History   Socioeconomic History   Marital status: Widowed    Spouse name: Not on file   Number of children: Not on file   Years of education: Not on file   Highest education level: Not on file  Occupational History   Not on file  Tobacco Use   Smoking status: Former    Packs/day: 1.00    Years: 37.00    Total pack years: 37.00    Types: Cigarettes    Quit date: 02/06/2010    Years since quitting: 12.4   Smokeless tobacco: Former    Types: Snuff  Vaping Use   Vaping Use: Never used  Substance and Sexual Activity   Alcohol use: Not Currently    Alcohol/week: 5.0 standard drinks of alcohol    Types: 5 Cans of beer per week    Comment: /h x of alcohol abuse -stopped 2012- now drinks 5 beer per week   Drug use: Not Currently    Types: Marijuana, "Crack" cocaine, Cocaine    Comment: hx of cocaine use- last use 2015; last use marijuana6/22/19,   Sexual activity: Yes  Other Topics Concern   Not on file  Social History Narrative   Lives with Significant Other x 43 years   Social Determinants of Health   Financial Resource Strain: Not on file  Food Insecurity: Not on file  Transportation Needs: Not on file  Physical Activity: Not on file  Stress: Not on file  Social Connections: Not on file  Intimate Partner Violence: Not on file      Review of Systems  Constitutional:  Negative for chills, fatigue and unexpected weight change.  HENT:  Negative for congestion, rhinorrhea, sneezing and sore throat.   Eyes:  Negative for redness.   Respiratory:  Negative for cough, chest tightness and shortness of breath.   Cardiovascular:  Negative for chest pain and palpitations.  Gastrointestinal:  Positive for abdominal pain. Negative for constipation, diarrhea, nausea and vomiting.  Genitourinary:  Negative for dysuria and frequency.  Musculoskeletal:  Positive for arthralgias (right shoulder) and back pain. Negative for  joint swelling and neck pain.  Skin:  Negative for rash.  Neurological: Negative.  Negative for tremors and numbness.  Hematological:  Negative for adenopathy. Does not bruise/bleed easily.  Psychiatric/Behavioral:  Negative for behavioral problems (Depression), sleep disturbance and suicidal ideas. The patient is not nervous/anxious.     Vital Signs: BP 119/62   Pulse (!) 111   Temp 98.4 F (36.9 C)   Resp 16   Ht 4' 11" (1.499 m)   Wt 124 lb 9.6 oz (56.5 kg)   SpO2 94% Comment: 3 L  BMI 25.17 kg/m    Physical Exam Vitals reviewed.  Constitutional:      General: She is not in acute distress.    Appearance: Normal appearance. She is normal weight. She is not ill-appearing.  HENT:     Head: Normocephalic and atraumatic.  Eyes:     Pupils: Pupils are equal, round, and reactive to light.  Cardiovascular:     Rate and Rhythm: Normal rate and regular rhythm.  Pulmonary:     Effort: Pulmonary effort is normal. No respiratory distress.  Abdominal:     Tenderness: There is abdominal tenderness in the right upper quadrant. There is no right CVA tenderness or left CVA tenderness.  Lymphadenopathy:     Comments: Lymphadenopathy ruq and right flank  Neurological:     Mental Status: She is alert and oriented to person, place, and time.  Psychiatric:        Mood and Affect: Mood normal.        Behavior: Behavior normal.        Assessment/Plan: 1. Uncontrolled type 2 diabetes mellitus with hyperglycemia (HCC) A1C greatly improved, dexcom received ordered. Continue diet and lifestyle modifications  as discussed, continue medications as prescribed.  - POCT glycosylated hemoglobin (Hb A1C) - Continuous Blood Gluc Receiver (Belt) DEVI; Use one as directed for uncontrolled dm  Dispense: 1 each; Refill: 0  2. Acute retroperitoneal lymphadenitis Empiric antibiotic treatment prescribed.  - doxycycline (VIBRA-TABS) 100 MG tablet; Take 1 tablet (100 mg total) by mouth 2 (two) times daily.  Dispense: 20 tablet; Refill: 0  3. Medication refill - ALPRAZolam (XANAX) 0.25 MG tablet; TAKE 1 TABLET BY MOUTH THREE TIMES DAILY AS NEEDED FOR ANXIETY  Dispense: 90 tablet; Refill: 2 - furosemide (LASIX) 20 MG tablet; Take 1 tablet (20 mg total) by mouth daily as needed.  Dispense: 30 tablet; Refill: 5 - diclofenac (VOLTAREN) 75 MG EC tablet; Take 1 tablet (75 mg total) by mouth 2 (two) times daily.  Dispense: 60 tablet; Refill: 2 - escitalopram (LEXAPRO) 10 MG tablet; Take 1 tablet (10 mg total) by mouth daily.  Dispense: 90 tablet; Refill: 1 - HYDROcodone-acetaminophen (NORCO) 7.5-325 MG tablet; Take 1 tablet by mouth every 6 (six) hours as needed for severe pain.  Dispense: 20 tablet; Refill: 0   General Counseling: Nikoletta verbalizes understanding of the findings of todays visit and agrees with plan of treatment. I have discussed any further diagnostic evaluation that may be needed or ordered today. We also reviewed her medications today. she has been encouraged to call the office with any questions or concerns that should arise related to todays visit.    Orders Placed This Encounter  Procedures   POCT glycosylated hemoglobin (Hb A1C)    Meds ordered this encounter  Medications   DISCONTD: Continuous Blood Gluc Sensor (DEXCOM G7 SENSOR) MISC    Sig: Apply 1 sensor to skin on designated areas every 10 days to monitor  glucose levels with sensor or smart phone app    Dispense:  3 each    Refill:  11    Dx code E11.65 -- patient is on insulin.   ALPRAZolam (XANAX) 0.25 MG tablet    Sig:  TAKE 1 TABLET BY MOUTH THREE TIMES DAILY AS NEEDED FOR ANXIETY    Dispense:  90 tablet    Refill:  2   furosemide (LASIX) 20 MG tablet    Sig: Take 1 tablet (20 mg total) by mouth daily as needed.    Dispense:  30 tablet    Refill:  5   diclofenac (VOLTAREN) 75 MG EC tablet    Sig: Take 1 tablet (75 mg total) by mouth 2 (two) times daily.    Dispense:  60 tablet    Refill:  2   escitalopram (LEXAPRO) 10 MG tablet    Sig: Take 1 tablet (10 mg total) by mouth daily.    Dispense:  90 tablet    Refill:  1   doxycycline (VIBRA-TABS) 100 MG tablet    Sig: Take 1 tablet (100 mg total) by mouth 2 (two) times daily.    Dispense:  20 tablet    Refill:  0   HYDROcodone-acetaminophen (NORCO) 7.5-325 MG tablet    Sig: Take 1 tablet by mouth every 6 (six) hours as needed for severe pain.    Dispense:  20 tablet    Refill:  0    Please fill today   Continuous Blood Gluc Receiver (DEXCOM G7 RECEIVER) DEVI    Sig: Use one as directed for uncontrolled dm    Dispense:  1 each    Refill:  0    Dx code E11.65, patient is on insulin    Return in about 4 weeks (around 07/07/2022) for F/U, Alyssa PCP, eval new med.   Total time spent:30 Minutes Time spent includes review of chart, medications, test results, and follow up plan with the patient.   [place] Controlled Substance Database was reviewed by me.  This patient was seen by Jonetta Osgood, FNP-C in collaboration with Dr. Clayborn Bigness as a part of collaborative care agreement.   Alyssa R. Valetta Fuller, MSN, FNP-C Internal medicine

## 2022-06-16 DIAGNOSIS — C3431 Malignant neoplasm of lower lobe, right bronchus or lung: Secondary | ICD-10-CM | POA: Diagnosis not present

## 2022-06-16 DIAGNOSIS — E119 Type 2 diabetes mellitus without complications: Secondary | ICD-10-CM | POA: Diagnosis not present

## 2022-06-16 DIAGNOSIS — R059 Cough, unspecified: Secondary | ICD-10-CM | POA: Diagnosis not present

## 2022-06-16 DIAGNOSIS — R5381 Other malaise: Secondary | ICD-10-CM | POA: Diagnosis not present

## 2022-06-16 DIAGNOSIS — I10 Essential (primary) hypertension: Secondary | ICD-10-CM | POA: Diagnosis not present

## 2022-06-16 DIAGNOSIS — Z87891 Personal history of nicotine dependence: Secondary | ICD-10-CM | POA: Diagnosis not present

## 2022-06-16 DIAGNOSIS — R0602 Shortness of breath: Secondary | ICD-10-CM | POA: Diagnosis not present

## 2022-06-16 DIAGNOSIS — R0603 Acute respiratory distress: Secondary | ICD-10-CM | POA: Diagnosis not present

## 2022-06-16 DIAGNOSIS — Z20822 Contact with and (suspected) exposure to covid-19: Secondary | ICD-10-CM | POA: Diagnosis not present

## 2022-06-16 DIAGNOSIS — J8410 Pulmonary fibrosis, unspecified: Secondary | ICD-10-CM | POA: Diagnosis not present

## 2022-06-16 DIAGNOSIS — J441 Chronic obstructive pulmonary disease with (acute) exacerbation: Secondary | ICD-10-CM | POA: Diagnosis not present

## 2022-06-16 DIAGNOSIS — Z743 Need for continuous supervision: Secondary | ICD-10-CM | POA: Diagnosis not present

## 2022-06-17 ENCOUNTER — Telehealth: Payer: Self-pay | Admitting: Internal Medicine

## 2022-06-17 NOTE — Telephone Encounter (Signed)
Lvm to schedule ED f/u-Toni ?

## 2022-06-18 DIAGNOSIS — J449 Chronic obstructive pulmonary disease, unspecified: Secondary | ICD-10-CM | POA: Diagnosis not present

## 2022-06-19 ENCOUNTER — Other Ambulatory Visit: Payer: Self-pay | Admitting: Internal Medicine

## 2022-06-19 ENCOUNTER — Other Ambulatory Visit: Payer: Self-pay | Admitting: Nurse Practitioner

## 2022-06-19 DIAGNOSIS — J449 Chronic obstructive pulmonary disease, unspecified: Secondary | ICD-10-CM

## 2022-06-19 MED ORDER — LEVEMIR FLEXTOUCH 100 UNIT/ML ~~LOC~~ SOPN: 12.0000 [IU] | PEN_INJECTOR | Freq: Every day | SUBCUTANEOUS | 11 refills | Status: DC

## 2022-06-19 MED ORDER — MOMETASONE FURO-FORMOTEROL FUM 200-5 MCG/ACT IN AERO: 2.0000 | INHALATION_SPRAY | Freq: Two times a day (BID) | RESPIRATORY_TRACT | 11 refills | Status: DC

## 2022-06-22 ENCOUNTER — Encounter: Payer: Self-pay | Admitting: Radiation Oncology

## 2022-06-22 ENCOUNTER — Other Ambulatory Visit: Payer: Self-pay | Admitting: *Deleted

## 2022-06-22 ENCOUNTER — Ambulatory Visit
Admission: RE | Admit: 2022-06-22 | Discharge: 2022-06-22 | Disposition: A | Discharge status: Home / Self Care | Payer: Medicare Other | Source: Ambulatory Visit | Attending: Radiation Oncology | Admitting: Radiation Oncology

## 2022-06-22 ENCOUNTER — Other Ambulatory Visit: Payer: Self-pay | Admitting: Nurse Practitioner

## 2022-06-22 VITALS — BP 139/79 | HR 92 | Temp 98.0°F | Resp 18 | Ht 59.0 in | Wt 124.5 lb

## 2022-06-22 DIAGNOSIS — Z87891 Personal history of nicotine dependence: Secondary | ICD-10-CM | POA: Diagnosis not present

## 2022-06-22 DIAGNOSIS — C3490 Malignant neoplasm of unspecified part of unspecified bronchus or lung: Secondary | ICD-10-CM

## 2022-06-22 DIAGNOSIS — C3431 Malignant neoplasm of lower lobe, right bronchus or lung: Secondary | ICD-10-CM | POA: Diagnosis not present

## 2022-06-22 DIAGNOSIS — Z85118 Personal history of other malignant neoplasm of bronchus and lung: Secondary | ICD-10-CM | POA: Diagnosis not present

## 2022-06-22 DIAGNOSIS — Z923 Personal history of irradiation: Secondary | ICD-10-CM | POA: Insufficient documentation

## 2022-06-22 DIAGNOSIS — E1165 Type 2 diabetes mellitus with hyperglycemia: Secondary | ICD-10-CM

## 2022-06-22 NOTE — Progress Notes (Signed)
Radiation Oncology Follow up Note  Name: Angela Jensen   Date:   06/22/2022 MRN:  [id number] DOB: Dec 02, 1955    This 66 y.o. 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  She also had right upper lobe treated with SBRT prior.  Seen today in routine follow-up she is on nasal oxygen.  She states she has a tries to have a cough although nothing comes up I have suggested some Mucinex for that.  She specifically [place] hemoptysis or any change in her pulmonary status she actually believes her breathing has somewhat improved over time.Marland Kitchen  Her last CT scan showed no evidence of pulmonary embolism advanced emphysema with plaque like atelectasis and fibrosis in the right lower lobe and right upper lobe.  Stable pulmonary nodules measuring 6 mm.  Patient had PET CT scan back in August showing no suspicious abnormal increased uptake within the lungs.  COMPLICATIONS OF TREATMENT: none  FOLLOW UP COMPLIANCE: keeps appointments   PHYSICAL EXAM:  BP 139/79   Pulse 92   Temp 98 F (36.7 C)   Resp 18   Ht 4\' 11"  (1.499 m)   Wt 124 lb 8 oz (56.5 kg)   BMI 25.15 kg/m  Frail-appearing female in NAD on nasal oxygen.  Well-developed well-nourished patient in NAD. HEENT reveals PERLA, EOMI, discs not visualized.  Oral cavity is clear. No oral mucosal lesions are identified. Neck is clear without evidence of cervical or supraclavicular adenopathy. Lungs are clear to A&P. Cardiac examination is essentially unremarkable with regular rate and rhythm without murmur rub or thrill. Abdomen is benign with no organomegaly or masses noted. Motor sensory and DTR levels are equal and symmetric in the upper and lower  extremities. Cranial nerves II through XII are grossly intact. Proprioception is intact. No peripheral adenopathy or edema is identified. No motor or sensory levels are noted. Crude visual fields are within normal range.  RADIOLOGY RESULTS: CT scans reviewed compatible with above-stated findings  PLAN: Present time patient is doing well with no evidence of disease now out 1 year from her last SBRT.  She is under close follow-up care by pulmonologist.  I have asked to see her back in 6 months with a follow-up CT scan at that time.  I have asked her to start taking Mucinex on a daily basis.  Patient knows to call with any concerns.  I would like to take this opportunity to thank you for allowing me to participate in the care of your patient.Noreene Filbert, MD

## 2022-06-24 DIAGNOSIS — E119 Type 2 diabetes mellitus without complications: Secondary | ICD-10-CM | POA: Diagnosis not present

## 2022-07-01 ENCOUNTER — Encounter: Payer: Self-pay | Admitting: Pharmacist

## 2022-07-01 NOTE — Progress Notes (Signed)
[facility])                                            [facility] Team                                        Statin Quality Measure Assessment    07/01/2022  Angela Jensen [birth year] [id number]  I am a [facility] clinical pharmacist that reviews patients for statin quality initiatives.     Per review of chart and payor information, [name] has a diagnosis of diabetes but is not currently filling a statin prescription.  This places her into the SUPD (Statin Use In Patients with Diabetes) measure for CMS.    Patient has documented trials of rosuvastatin with reported cramps and muscle pain, but no corresponding CPT codes that would exclude patient from SUPD measure. If clinically appropriate, please consider evaluating [name] for either a statin re-challenge or code for past statin intolerance at her office visit tomorrow.  Please consider ONE of the following recommendations:  Initiate high intensity statin Atorvastatin 40mg  once daily, #90, 3 refills   Rosuvastatin 20mg  once daily, #90, 3 refills    Initiate moderate intensity          statin with reduced frequency if prior          statin intolerance 1x weekly, #13, 3 refills   2x weekly, #26, 3 refills   3x weekly, #39, 3 refills    Code for past statin intolerance or  other exclusions (required annually)  Provider Requirements: Associate code during an office visit or telehealth encounter  Drug Induced Myopathy G72.0   Myopathy, unspecified G72.9   Myositis, unspecified M60.9   Rhabdomyolysis T34.28   Alcoholic fatty liver J68.1   Cirrhosis of liver K74.69   Prediabetes R73.03    Thank you for your time and consideration, Curlene Labrum, PharmD [place] Pharmacist Office: [phone]

## 2022-07-02 ENCOUNTER — Ambulatory Visit: Payer: Medicare Other | Admitting: Internal Medicine

## 2022-07-02 ENCOUNTER — Encounter: Payer: Self-pay | Admitting: Internal Medicine

## 2022-07-02 ENCOUNTER — Other Ambulatory Visit: Payer: Self-pay | Admitting: Nurse Practitioner

## 2022-07-02 VITALS — BP 110/70 | HR 75 | Temp 98.6°F | Resp 16 | Ht 59.0 in | Wt 129.0 lb

## 2022-07-02 DIAGNOSIS — I5042 Chronic combined systolic (congestive) and diastolic (congestive) heart failure: Secondary | ICD-10-CM

## 2022-07-02 DIAGNOSIS — J449 Chronic obstructive pulmonary disease, unspecified: Secondary | ICD-10-CM | POA: Diagnosis not present

## 2022-07-02 DIAGNOSIS — J9611 Chronic respiratory failure with hypoxia: Secondary | ICD-10-CM

## 2022-07-02 DIAGNOSIS — E1165 Type 2 diabetes mellitus with hyperglycemia: Secondary | ICD-10-CM

## 2022-07-02 DIAGNOSIS — Z9981 Dependence on supplemental oxygen: Secondary | ICD-10-CM | POA: Diagnosis not present

## 2022-07-02 MED ORDER — PREDNISONE 10 MG (21) PO TBPK: ORAL_TABLET | ORAL | 0 refills | Status: DC

## 2022-07-02 MED ORDER — ALBUTEROL SULFATE (2.5 MG/3ML) 0.083% IN NEBU: INHALATION_SOLUTION | RESPIRATORY_TRACT | 1 refills | Status: DC

## 2022-07-02 MED ORDER — GABAPENTIN 100 MG PO CAPS: 100.0000 mg | ORAL_CAPSULE | Freq: Two times a day (BID) | ORAL | 0 refills | Status: DC

## 2022-07-02 MED ORDER — LEVOFLOXACIN 500 MG PO TABS: 500.0000 mg | ORAL_TABLET | Freq: Every day | ORAL | 0 refills | Status: AC

## 2022-07-02 MED ORDER — DEXCOM G7 SENSOR MISC: 11 refills | Status: DC

## 2022-07-02 NOTE — Progress Notes (Signed)
[facility] [place], [place] [address]  Pulmonary Sleep Medicine   Office Visit Note  Patient Name: Angela Jensen DOB: 21-Jan-1956 MRN [id number]  Date of Service: 07/02/2022  Complaints/HPI: Cough and wheeze going on for a few weeks. She states that she has received a zpk and has not noted any improvement. Sh has had a CXR no infection noted. Patient states she has no fevers or chills noted. She has not had covid. Her cxr shows some chronic changes related to the history of her cancer.  ROS  General: (-) fever, (-) chills, (-) night sweats, (-) weakness Skin: (-) rashes, (-) itching,. Eyes: (-) visual changes, (-) redness, (-) itching. Nose and Sinuses: (-) nasal stuffiness or itchiness, (-) postnasal drip, (-) nosebleeds, (-) sinus trouble. Mouth and Throat: (-) sore throat, (-) hoarseness. Neck: (-) swollen glands, (-) enlarged thyroid, (-) neck pain. Respiratory: + cough, (-) bloody sputum, + shortness of breath, + wheezing. Cardiovascular: - ankle swelling, (-) chest pain. Lymphatic: (-) lymph node enlargement. Neurologic: (-) numbness, (-) tingling. Psychiatric: (-) anxiety, (-) depression   Current Medication: Outpatient Encounter Medications as of 07/02/2022  Medication Sig   Accu-Chek Softclix Lancets lancets Use as instructed for twice a daily Dx E11.65   albuterol (VENTOLIN HFA) 108 (90 Base) MCG/ACT inhaler INHALE 2 PUFFS BY MOUTH EVERY 6 HOURS AS NEEDED FOR WHEEZING OR SHORTNESS OF BREATH   ALPRAZolam (XANAX) 0.25 MG tablet TAKE 1 TABLET BY MOUTH THREE TIMES DAILY AS NEEDED FOR ANXIETY   Blood Glucose Monitoring Suppl (ACCU-CHEK GUIDE) w/Device KIT Use as directed Dx e11.65   clopidogrel (PLAVIX) 75 MG tablet Take 1 tablet by mouth once daily   Continuous Blood Gluc Receiver (DEXCOM G7 RECEIVER) DEVI Use one as directed for uncontrolled dm   cyclobenzaprine (FLEXERIL) 10 MG tablet Take 1 tablet (10 mg total) by mouth at bedtime. Take one  tab po qhs for back spasm prn only   diclofenac (VOLTAREN) 75 MG EC tablet Take 1 tablet (75 mg total) by mouth 2 (two) times daily.   doxycycline (VIBRA-TABS) 100 MG tablet Take 1 tablet (100 mg total) by mouth 2 (two) times daily.   escitalopram (LEXAPRO) 10 MG tablet Take 1 tablet (10 mg total) by mouth daily.   ferrous sulfate 325 (65 FE) MG tablet Take 325 mg by mouth 2 (two) times daily with a meal.   fluticasone (FLONASE) 50 MCG/ACT nasal spray Place 2 sprays into both nostrils daily.   furosemide (LASIX) 20 MG tablet Take 1 tablet (20 mg total) by mouth daily as needed.   glucose blood (ACCU-CHEK GUIDE) test strip USE  STRIP TO CHECK GLUCOSE first thing in the morning, before meals and at bedtime (check glucose level a total of 5 times daily).   HYDROcodone-acetaminophen (NORCO) 7.5-325 MG tablet Take 1 tablet by mouth every 6 (six) hours as needed for severe pain.   insulin detemir (LEVEMIR FLEXTOUCH) 100 UNIT/ML FlexPen Inject 12 Units into the skin daily with supper. Inject 8- 12 units with supper qd// with needles   lisinopril-hydrochlorothiazide (ZESTORETIC) 20-25 MG tablet Take 1 tablet by mouth daily.   metFORMIN (GLUCOPHAGE) 500 MG tablet Take 2 tablets (1,000 mg total) by mouth daily with breakfast.   metoprolol tartrate (LOPRESSOR) 25 MG tablet Take 1/2 (one-half) tablet by mouth twice daily   mometasone-formoterol (DULERA) 200-5 MCG/ACT AERO Inhale 2 puffs into the lungs 2 (two) times daily.   Omega-3 Fatty Acids (FISH OIL) 1000 MG CAPS Take  1 capsule by mouth daily.   OXYGEN Inhale 4 L into the lungs. PT USES [facility] FOR OXYGEN   potassium chloride (KLOR-CON) 10 MEQ tablet TAKE 1 TABLET BY MOUTH EVERY OTHER DAY   RELION PEN NEEDLES 31G X 6 MM MISC USE WITH INSULIN PEN TO INJECT INSULIN ONCE DAILY AS DIRECTED   tiotropium (SPIRIVA HANDIHALER) 18 MCG inhalation capsule INHALE 1 PUFF BY MOUTH ONCE DAILY. DO NOT SWALLOW CAPSULE   [DISCONTINUED] albuterol (PROVENTIL) (2.5  MG/3ML) 0.083% nebulizer solution USE 1 VIAL IN NEBULIZER EVERY 6 HOURS AS NEEDED FOR WHEEZING   [DISCONTINUED] Continuous Blood Gluc Sensor (DEXCOM G7 SENSOR) MISC Apply 1 sensor to skin on designated areas every 10 days to monitor glucose levels with sensor or smart phone app   [DISCONTINUED] gabapentin (NEURONTIN) 100 MG capsule Take 1 capsule by mouth twice daily   famotidine (PEPCID) 20 MG tablet Take 1 tablet (20 mg total) by mouth daily for 14 days.   No facility-administered encounter medications on file as of 07/02/2022.    Surgical History: Past Surgical History:  Procedure Laterality Date   CESAREAN SECTION     x3   COLONOSCOPY WITH PROPOFOL N/A 06/25/2015   Procedure: COLONOSCOPY WITH PROPOFOL;  Surgeon: Lucilla Lame, MD;  Location: [facility];  Service: Endoscopy;  Laterality: N/A;   COLONOSCOPY WITH PROPOFOL N/A 07/26/2020   Procedure: COLONOSCOPY WITH PROPOFOL;  Surgeon: Lucilla Lame, MD;  Location: [place];  Service: Endoscopy;  Laterality: N/A;   CYST REMOVAL LEG     and on shoulder    ESOPHAGOGASTRODUODENOSCOPY (EGD) WITH PROPOFOL N/A 07/26/2020   Procedure: ESOPHAGOGASTRODUODENOSCOPY (EGD) WITH PROPOFOL;  Surgeon: Lucilla Lame, MD;  Location: [place];  Service: Endoscopy;  Laterality: N/A;  Diabetic - oral meds   LOWER EXTREMITY ANGIOGRAPHY Left 09/29/2018   Procedure: LOWER EXTREMITY ANGIOGRAPHY;  Surgeon: Algernon Huxley, MD;  Location: [place] [facility];  Service: Cardiovascular;  Laterality: Left;   LUNG BIOPSY  12/30/2011   has lung "spots"   PACEMAKER IMPLANT  07/14/2021   PACEMAKER LEADLESS INSERTION N/A 07/14/2021   Procedure: PACEMAKER LEADLESS INSERTION;  Surgeon: Isaias Cowman, MD;  Location: [place] [facility];  Service: Cardiovascular;  Laterality: N/A;   PERIPHERAL VASCULAR CATHETERIZATION Left 06/01/2016   Procedure: Lower Extremity Angiography;  Surgeon: Algernon Huxley, MD;  Location: [place] [facility];  Service:  Cardiovascular;  Laterality: Left;   PERIPHERAL VASCULAR CATHETERIZATION N/A 06/01/2016   Procedure: Abdominal Aortogram w/Lower Extremity;  Surgeon: Algernon Huxley, MD;  Location: [place] [facility];  Service: Cardiovascular;  Laterality: N/A;   PERIPHERAL VASCULAR CATHETERIZATION  06/01/2016   Procedure: Lower Extremity Intervention;  Surgeon: Algernon Huxley, MD;  Location: [place] [facility];  Service: Cardiovascular;;   PERIPHERAL VASCULAR CATHETERIZATION Right 06/08/2016   Procedure: Lower Extremity Angiography;  Surgeon: Algernon Huxley, MD;  Location: [place] [facility];  Service: Cardiovascular;  Laterality: Right;   PERIPHERAL VASCULAR CATHETERIZATION  06/08/2016   Procedure: Lower Extremity Intervention;  Surgeon: Algernon Huxley, MD;  Location: [name] [facility];  Service: Cardiovascular;;   SUBMANDIBULAR GLAND EXCISION Left 12/06/2020   Procedure: EXCISION SUBMANDIBULAR GLAND;  Surgeon: Beverly Gust, MD;  Location: [place];  Service: ENT;  Laterality: Left;  needs to be first case Diabetic - diet controlled   TEMPORARY PACEMAKER N/A 07/11/2021   Procedure: TEMPORARY PACEMAKER;  Surgeon: Isaias Cowman, MD;  Location: [place] [facility];  Service: Cardiovascular;  Laterality: N/A;  Medical History: Past Medical History:  Diagnosis Date   Anemia    Arthritis    Asthma    Atherosclerosis of native arteries of extremity with intermittent claudication ([place]) 05/26/2016   Cancer ([place]) 2012   Right Lung CA   COPD (chronic obstructive pulmonary disease) ([place])    Depression    Diabetes mellitus without complication ([place])    Patient takes Janumet   Essential hypertension 05/26/2016   Heart failure ([place]) 2022   Hypercholesteremia    Oxygen dependent    2L at nite    PAD (peripheral artery disease) ([place]) 06/22/2016   Peripheral vascular disease (HCC)    Personal history of radiation therapy    Shortness of breath dyspnea    with exertion    Sialolithiasis     Sleep apnea    Wears dentures    full upper and lower    Family History: Family History  Problem Relation Age of Onset   Lung cancer Father    Diabetes Mother    Hypercholesterolemia Mother    Diabetes Sister    Diabetes Maternal Grandmother    Diabetes Paternal Grandmother    Diabetes Sister    Heart attack Brother    Coronary artery disease Brother    Vascular Disease Brother    Hypertension Sister    Heart attack Brother     Social History: Social History   Socioeconomic History   Marital status: Widowed    Spouse name: Not on file   Number of children: Not on file   Years of education: Not on file   Highest education level: Not on file  Occupational History   Not on file  Tobacco Use   Smoking status: Former    Packs/day: 1.00    Years: 37.00    Total pack years: 37.00    Types: Cigarettes    Quit date: 02/06/2010    Years since quitting: 12.4   Smokeless tobacco: Former    Types: Snuff  Vaping Use   Vaping Use: Never used  Substance and Sexual Activity   Alcohol use: Not Currently    Alcohol/week: 5.0 standard drinks of alcohol    Types: 5 Cans of beer per week    Comment: /h x of alcohol abuse -stopped 2012- now drinks 5 beer per week   Drug use: Not Currently    Types: Marijuana, "Crack" cocaine, Cocaine    Comment: hx of cocaine use- last use 2015; last use marijuana6/22/19,   Sexual activity: Yes  Other Topics Concern   Not on file  Social History Narrative   Lives with Significant Other x 43 years   Social Determinants of Health   Financial Resource Strain: Not on file  Food Insecurity: Not on file  Transportation Needs: Not on file  Physical Activity: Not on file  Stress: Not on file  Social Connections: Not on file  Intimate Partner Violence: Not on file    Vital Signs: Blood pressure 110/70, pulse 75, temperature 98.6 F (37 C), resp. rate 16, height _0  (1.499 m), weight 129 lb (58.5 kg), SpO2 95 %.  Examination: General  Appearance: The patient is well-developed, well-nourished, and in no distress. Skin: Gross inspection of skin unremarkable. Head: normocephalic, no gross deformities. Eyes: no gross deformities noted. ENT: ears appear grossly normal no exudates. Neck: Supple. No thyromegaly. No LAD. Respiratory: few rhonchi noted. Cardiovascular: Normal S1 and S2 without murmur or rub. Extremities: No cyanosis. pulses are equal. Neurologic: Alert and oriented. No  involuntary movements.  LABS: Recent Results (from the past 2160 hour(s))  Basic metabolic panel     Status: Abnormal   Collection Time: 06/05/22  8:48 PM  Result Value Ref Range   Sodium 135 135 - 145 mmol/L   Potassium 3.3 (L) 3.5 - 5.1 mmol/L   Chloride 99 98 - 111 mmol/L   CO2 27 22 - 32 mmol/L   Glucose, Bld 204 (H) 70 - 99 mg/dL    Comment: Glucose reference range applies only to samples taken after fasting for at least 8 hours.   BUN 33 (H) 8 - 23 mg/dL   Creatinine, Ser 0.85 0.44 - 1.00 mg/dL   Calcium 8.9 8.9 - 10.3 mg/dL   GFR, Estimated >60 >60 mL/min    Comment: (NOTE) Calculated using the CKD-EPI Creatinine Equation (2021)    Anion gap 9 5 - 15    Comment: Performed at [facility], [address] [place], [place] [address]  CBC     Status: Abnormal   Collection Time: 06/05/22  8:48 PM  Result Value Ref Range   WBC 6.0 4.0 - 10.5 K/uL   RBC 3.43 (L) 3.87 - 5.11 MIL/uL   Hemoglobin 9.7 (L) 12.0 - 15.0 g/dL   HCT 32.5 (L) 36.0 - 46.0 %   MCV 94.8 80.0 - 100.0 fL   MCH 28.3 26.0 - 34.0 pg   MCHC 29.8 (L) 30.0 - 36.0 g/dL   RDW 14.0 11.5 - 15.5 %   Platelets 266 150 - 400 K/uL   nRBC 0.0 0.0 - 0.2 %    Comment: Performed at [facility], [place], [place] [address]  Troponin I (High Sensitivity)     Status: None   Collection Time: 06/05/22  8:48 PM  Result Value Ref Range   Troponin I (High Sensitivity) 7 <18 ng/L    Comment: (NOTE) Elevated high sensitivity troponin I (hsTnI)  values and significant  changes across serial measurements may suggest ACS but many other  chronic and acute conditions are known to elevate hsTnI results.  Refer to the "Links" section for chest pain algorithms and additional  guidance. Performed at [facility], [address] [place], [place] [address]   Lipase, blood     Status: None   Collection Time: 06/05/22  8:48 PM  Result Value Ref Range   Lipase 23 11 - 51 U/L    Comment: Performed at [facility], [address] [place], [place] [address]  Hepatic function panel     Status: Abnormal   Collection Time: 06/05/22  8:48 PM  Result Value Ref Range   Total Protein 7.1 6.5 - 8.1 g/dL   Albumin 3.7 3.5 - 5.0 g/dL   AST 26 15 - 41 U/L   ALT 20 0 - 44 U/L   Alkaline Phosphatase 62 38 - 126 U/L   Total Bilirubin 0.2 (L) 0.3 - 1.2 mg/dL   Bilirubin, Direct <0.1 0.0 - 0.2 mg/dL   Indirect Bilirubin NOT CALCULATED 0.3 - 0.9 mg/dL    Comment: Performed at [facility], [place], [place] [address]  Troponin I (High Sensitivity)     Status: None   Collection Time: 06/06/22  1:19 AM  Result Value Ref Range   Troponin I (High Sensitivity) 6 <18 ng/L    Comment: (NOTE) Elevated high sensitivity troponin I (hsTnI) values and significant  changes across serial measurements may suggest ACS but many other  chronic  and acute conditions are known to elevate hsTnI results.  Refer to the "Links" section for chest pain algorithms and additional  guidance. Performed at [facility], [address] [place], [place] [address]   POCT glycosylated hemoglobin (Hb A1C)     Status: Abnormal   Collection Time: 06/09/22  1:44 PM  Result Value Ref Range   Hemoglobin A1C 6.6 (A) 4.0 - 5.6 %   HbA1c POC (<> result, manual entry)     HbA1c, POC (prediabetic range)     HbA1c, POC (controlled diabetic range)      Radiology: No results found.  No results found.  CT Angio Chest PE W and/or Wo  Contrast  Result Date: 06/06/2022 CLINICAL DATA:  Pain with inhalation in her back and under her right breast; PE suspected; nausea vomiting and abdominal pain EXAM: CT ANGIOGRAPHY CHEST CT ABDOMEN AND PELVIS WITH CONTRAST TECHNIQUE: Multidetector CT imaging of the chest was performed using the standard protocol during bolus administration of intravenous contrast. Multiplanar CT image reconstructions and MIPs were obtained to evaluate the vascular anatomy. Multidetector CT imaging of the abdomen and pelvis was performed using the standard protocol during bolus administration of intravenous contrast. RADIATION DOSE REDUCTION: This exam was performed according to the departmental dose-optimization program which includes automated exposure control, adjustment of the mA and/or kV according to patient size and/or use of iterative reconstruction technique. CONTRAST:  22m OMNIPAQUE IOHEXOL 350 MG/ML SOLN COMPARISON:  Abdominal ultrasound earlier today and PET/CT 03/18/2022 and chest CT 02/20/2022 FINDINGS: CTA CHEST FINDINGS Cardiovascular: Satisfactory opacification of the pulmonary arteries to the segmental level. No evidence of pulmonary embolism. Normal heart size. No pericardial effusion. Mediastinum/Nodes: No enlarged mediastinal, hilar, or axillary lymph nodes. Thyroid gland, trachea, and esophagus demonstrate no significant findings. Lungs/Pleura: Advanced emphysema. Biapical pleural-parenchymal scarring. Bandlike area of architectural distortion and consolidation within the right lower lobe is similar to 02/20/2022. Similar though smaller appearing region of bandlike atelectasis/fibrosis in the inferior right upper lobe is also similar. These did not demonstrate significant uptake on PET/CT 03/18/2022. Unchanged 6 mm left upper lobe nodule (series 6/image 26). 5 and 4 mm nodules in the left lower lobe are also unchanged (series 6/image 67). Musculoskeletal: Chronic right lateral seventh rib fracture. No acute  osseous findings. Review of the MIP images confirms the above findings. CT ABDOMEN and PELVIS FINDINGS Hepatobiliary: No focal liver abnormality is seen. No gallstones, gallbladder wall thickening, or biliary dilatation. Pancreas: Unremarkable. No pancreatic ductal dilatation or surrounding inflammatory changes. Spleen: Unremarkable. Adrenals/Urinary Tract: Unremarkable adrenal glands. Simple appearing right renal cyst not requiring follow-up. No hydronephrosis or urinary calculi. Unremarkable bladder. Stomach/Bowel: Normal caliber large and small bowel. Colonic diverticulosis without diverticulitis. Normal appendix. Stomach is unremarkable. Vascular/Lymphatic: Advanced aorto bi-iliac atherosclerotic calcification. Partially visualized left superficial femoral artery stent. No suspicious adenopathy. 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 Globular fibroid uterus. Other: No free intraperitoneal fluid or air. Musculoskeletal: No acute osseous abnormality. Review of the MIP images confirms the above findings. IMPRESSION: Negative for acute pulmonary embolism. Advanced emphysema. Similar areas of platelike atelectasis/fibrosis in the right lower lobe and right upper lobe. Stable pulmonary nodules measuring up to 6 mm. Chronic lateral right seventh rib fracture. No acute abnormality in the abdomen or pelvis. Aortic Atherosclerosis (ICD10-I70.0) and Emphysema (ICD10-J43.9). Electronically Signed   By: TCarroll KindsD.  On: 06/06/2022 03:13   CT ABDOMEN PELVIS W CONTRAST  Result Date: 06/06/2022 CLINICAL DATA:  Pain with inhalation in her back and under her right breast; PE suspected; nausea vomiting and abdominal pain EXAM: CT ANGIOGRAPHY CHEST CT ABDOMEN AND PELVIS WITH CONTRAST TECHNIQUE: Multidetector CT imaging of the chest  was performed using the standard protocol during bolus administration of intravenous contrast. Multiplanar CT image reconstructions and MIPs were obtained to evaluate the vascular anatomy. Multidetector CT imaging of the abdomen and pelvis was performed using the standard protocol during bolus administration of intravenous contrast. RADIATION DOSE REDUCTION: This exam was performed according to the departmental dose-optimization program which includes automated exposure control, adjustment of the mA and/or kV according to patient size and/or use of iterative reconstruction technique. CONTRAST:  22m OMNIPAQUE IOHEXOL 350 MG/ML SOLN COMPARISON:  Abdominal ultrasound earlier today and PET/CT 03/18/2022 and chest CT 02/20/2022 FINDINGS: CTA CHEST FINDINGS Cardiovascular: Satisfactory opacification of the pulmonary arteries to the segmental level. No evidence of pulmonary embolism. Normal heart size. No pericardial effusion. Mediastinum/Nodes: No enlarged mediastinal, hilar, or axillary lymph nodes. Thyroid gland, trachea, and esophagus demonstrate no significant findings. Lungs/Pleura: Advanced emphysema. Biapical pleural-parenchymal scarring. Bandlike area of architectural distortion and consolidation within the right lower lobe is similar to 02/20/2022. Similar though smaller appearing region of bandlike atelectasis/fibrosis in the inferior right upper lobe is also similar. These did not demonstrate significant uptake on PET/CT 03/18/2022. Unchanged 6 mm left upper lobe nodule (series 6/image 26). 5 and 4 mm nodules in the left lower lobe are also unchanged (series 6/image 67). Musculoskeletal: Chronic right lateral seventh rib fracture. No acute osseous findings. Review of the MIP images confirms the above findings. CT ABDOMEN and PELVIS FINDINGS Hepatobiliary: No focal liver abnormality is seen. No gallstones, gallbladder wall thickening, or biliary dilatation. Pancreas: Unremarkable. No pancreatic ductal  dilatation or surrounding inflammatory changes. Spleen: Unremarkable. Adrenals/Urinary Tract: Unremarkable adrenal glands. Simple appearing right renal cyst not requiring follow-up. No hydronephrosis or urinary calculi. Unremarkable bladder. Stomach/Bowel: Normal caliber large and small bowel. Colonic diverticulosis without diverticulitis. Normal appendix. Stomach is unremarkable. Vascular/Lymphatic: Advanced aorto bi-iliac atherosclerotic calcification. Partially visualized left superficial femoral artery stent. No suspicious adenopathy. 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 Globular fibroid uterus. Other: No free intraperitoneal fluid or air. Musculoskeletal: No acute osseous abnormality. Review of the MIP images confirms the above findings. IMPRESSION: Negative for acute pulmonary embolism. Advanced emphysema. Similar areas of platelike atelectasis/fibrosis in the right lower lobe and right upper lobe. Stable pulmonary nodules measuring up to 6 mm. Chronic lateral right seventh rib fracture. No acute abnormality in the abdomen or pelvis. Aortic Atherosclerosis (ICD10-I70.0) and Emphysema (ICD10-J43.9). Electronically Signed   By: TPlacido SouM.D.   On: 06/06/2022 03:13   [place]ABDOMEN LIMITED RUQ (LIVER/GB)  Result Date: 06/06/2022 CLINICAL DATA:  Right upper quadrant pain EXAM: ULTRASOUND ABDOMEN LIMITED RIGHT UPPER QUADRANT COMPARISON:  PET/CT 03/18/2022 FINDINGS: Gallbladder: No gallstones or wall thickening visualized. No sonographic Murphy sign noted by sonographer. Common bile duct: Diameter: 4.1 Liver: No focal lesion identified. Within normal limits in parenchymal echogenicity. Portal vein is patent on color Doppler imaging with normal direction of blood flow towards the liver. Other: None. IMPRESSION: Unremarkable  right upper quadrant ultrasound. Electronically Signed   By: TPlacido SouM.D.   On: 06/06/2022 01:48   DG  Chest 2 View  Result Date: 06/05/2022 CLINICAL DATA:  Chest pain EXAM: CHEST - 2 VIEW COMPARISON:  07/12/2021, CT 02/20/2022 FINDINGS: Lead this pacemaker in the right ventricle. Curvilinear area of opacity and distortion in the right lower lobe with mild nodularity superiorly, corresponding to CT post therapeutic changes. No acute airspace disease or pleural effusion. Stable cardiomediastinal silhouette. Aortic atherosclerosis IMPRESSION: 1. Curvilinear and slightly nodular opacity with distortion in the right lower and middle lobes similar in configuration to prior chest CT and felt consistent with post therapeutic changes. 2. No acute airspace disease Electronically Signed   By: Donavan Foil M.D.   On: 06/05/2022 21:08      Assessment and Plan: Patient Active Problem List   Diagnosis Date Noted   Symptomatic bradycardia 07/09/2021   Chronic respiratory failure with hypoxia (HCC)    CAP (community acquired pneumonia) 06/26/2021   COPD exacerbation ([place]) 06/11/2021   SOB (shortness of breath) 05/01/2021   HCAP (healthcare-associated pneumonia) 05/01/2021   Chronic combined systolic and diastolic CHF (congestive heart failure) ([place]) 05/01/2021   Acute exacerbation of chronic obstructive pulmonary disease (COPD) ([place]) 04/30/2021   Elevated troponin 04/01/2021   Acute on chronic respiratory failure with hypoxia ([place]) 03/31/2021   Pneumothorax after biopsy 05/03/2020   Pneumothorax on right 05/01/2020   Chest pain on breathing 04/21/2020   Pleuritic chest pain 04/11/2020   Right lower lobe pulmonary nodule 04/02/2020   Iron deficiency anemia 04/02/2020   Pica in adults 04/02/2020   Goals of care, counseling/discussion 03/14/2020   Pain in left wrist 09/24/2019   Encounter for general adult medical examination with abnormal findings 06/28/2019   Elevated total protein 01/22/2019    Uncontrolled type 2 diabetes mellitus with hyperglycemia ([place]) 05/16/2018   Chronic obstructive pulmonary disease ([place]) 05/16/2018   Vitamin D deficiency 05/16/2018   Flu vaccine need 05/16/2018   Dysuria 05/16/2018   Cancer of lower lobe of right lung ([place]) 01/21/2018   Screening for breast cancer 10/12/2017   Personal history of tobacco use, presenting hazards to health 02/03/2017   PAD (peripheral artery disease) ([place]) 06/22/2016   Essential hypertension 05/26/2016   Diabetes ([place]) 05/26/2016   Hyperlipidemia 05/26/2016   Atherosclerosis of native arteries of extremity with intermittent claudication ([place]) 05/26/2016   Uterine leiomyoma 04/30/2016   Elevated CEA 01/31/2016   Special screening for malignant neoplasms, colon    Benign neoplasm of sigmoid colon    Primary lung cancer ([place])    Malignant neoplasm of upper lobe of right lung ([place])     1. Chronic obstructive pulmonary disease, unspecified COPD type ([place]) Acute exacerbation will place patient on antibiotics and steroids - predniSONE (STERAPRED UNI-PAK 21 TAB) 10 MG (21) TBPK tablet; As directed one dose pack for 6 days  Dispense: 1 each; Refill: 0 - levofloxacin (LEVAQUIN) 500 MG tablet; Take 1 tablet (500 mg total) by mouth daily for 7 days.  Dispense: 7 tablet; Refill: 0  2. Oxygen dependent She was recommended to continue with the recommended oxygen therapy.  She needs to be compliant with therapy  3. Chronic combined systolic and diastolic CHF (congestive heart failure) (HCC) Appears to be compensated we will continue to monitor fluid status restrict salt  4. Chronic respiratory failure with hypoxia (HCC) On oxygen therapy will continue   General Counseling: I have discussed the findings of the evaluation and examination with [facility].  I have also discussed any further diagnostic evaluation thatmay be needed or ordered today. Aleea verbalizes understanding of the  findings of todays visit. We also reviewed her  medications today and discussed drug interactions and side effects including but not limited excessive drowsiness and altered mental states. We also discussed that there is always a risk not just to her but also people around her. she has been encouraged to call the office with any questions or concerns that should arise related to todays visit.  No orders of the defined types were placed in this encounter.    Time spent: 81  I have personally obtained a history, examined the patient, evaluated laboratory and imaging results, formulated the assessment and plan and placed orders.    Allyne Gee, MD [facility] ([place]) Pulmonary and Critical Care Sleep medicine

## 2022-07-02 NOTE — Patient Instructions (Signed)
Chronic Obstructive Pulmonary Disease  Chronic obstructive pulmonary disease (COPD) is a long-term (chronic) lung problem. When you have COPD, it is hard for air to get in and out of your lungs. Usually the condition gets worse over time, and your lungs will never return to normal. There are things you can do to keep yourself as healthy as possible. What are the causes? Smoking. This is the most common cause. Certain genes passed from parent to child (inherited). What increases the risk? Being exposed to secondhand smoke from cigarettes, pipes, or cigars. Being exposed to chemicals and other irritants, such as fumes and dust in the work environment. Having chronic lung conditions or infections. What are the signs or symptoms? Shortness of breath, especially during physical activity. A long-term cough with a large amount of thick mucus. Sometimes, the cough may not have any mucus (dry cough). Wheezing. Breathing quickly. Skin that looks gray or blue, especially in the fingers, toes, or lips. Feeling tired (fatigue). Weight loss. Chest tightness. Having infections often. Episodes when breathing symptoms become much worse (exacerbations). At the later stages of this disease, you may have swelling in the ankles, feet, or legs. How is this treated? Taking medicines. Quitting smoking, if you smoke. Rehabilitation. This includes steps to make your body work better. It may involve a team of specialists. Doing exercises. Making changes to your diet. Using oxygen. Lung surgery. Lung transplant. Comfort measures (palliative care). Follow these instructions at home: Medicines Take over-the-counter and prescription medicines only as told by your doctor. Talk to your doctor before taking any cough or allergy medicines. You may need to avoid medicines that cause your lungs to be dry. Lifestyle If you smoke, stop smoking. Smoking makes the problem worse. Do not smoke or use any products that  contain nicotine or tobacco. If you need help quitting, ask your doctor. Avoid being around things that make your breathing worse. This may include smoke, chemicals, and fumes. Stay active, but remember to rest as well. Learn and use tips on how to manage stress and control your breathing. Make sure you get enough sleep. Most adults need at least 7 hours of sleep every night. Eat healthy foods. Eat smaller meals more often. Rest before meals. Controlled breathing Learn and use tips on how to control your breathing as told by your doctor. Try: Breathing in (inhaling) through your nose for 1 second. Then, pucker your lips and breath out (exhale) through your lips for 2 seconds. Putting one hand on your belly (abdomen). Breathe in slowly through your nose for 1 second. Your hand on your belly should move out. Pucker your lips and breathe out slowly through your lips. Your hand on your belly should move in as you breathe out.  Controlled coughing Learn and use controlled coughing to clear mucus from your lungs. Follow these steps: Lean your head a little forward. Breathe in deeply. Try to hold your breath for 3 seconds. Keep your mouth slightly open while coughing 2 times. Spit any mucus out into a tissue. Rest and do the steps again 1 or 2 times as needed. General instructions Make sure you get all the shots (vaccines) that your doctor recommends. Ask your doctor about a flu shot and a pneumonia shot. Use oxygen therapy and pulmonary rehabilitation if told by your doctor. If you need home oxygen therapy, ask your doctor if you should buy a tool to measure your oxygen level (oximeter). Make a COPD action plan with your doctor. This helps you   to know what to do if you feel worse than usual. Manage any other conditions you have as told by your doctor. Avoid going outside when it is very hot, cold, or humid. Avoid people who have a sickness you can catch (contagious). Keep all follow-up  visits. Contact a doctor if: You cough up more mucus than usual. There is a change in the color or thickness of the mucus. It is harder to breathe than usual. Your breathing is faster than usual. You have trouble sleeping. You need to use your medicines more often than usual. You have trouble doing your normal activities such as getting dressed or walking around the house. Get help right away if: You have shortness of breath while resting. You have shortness of breath that stops you from: Being able to talk. Doing normal activities. Your chest hurts for longer than 5 minutes. Your skin color is more blue than usual. Your pulse oximeter shows that you have low oxygen for longer than 5 minutes. You have a fever. You feel too tired to breathe normally. These symptoms may represent a serious problem that is an emergency. Do not wait to see if the symptoms will go away. Get medical help right away. Call your local emergency services (911 in the [place] Do not drive yourself to the hospital. Summary Chronic obstructive pulmonary disease (COPD) is a long-term lung problem. The way your lungs work will never return to normal. Usually the condition gets worse over time. There are things you can do to keep yourself as healthy as possible. Take over-the-counter and prescription medicines only as told by your doctor. If you smoke, stop. Smoking makes the problem worse. This information is not intended to replace advice given to you by your health care provider. Make sure you discuss any questions you have with your health care provider. Document Revised: 06/11/2020 Document Reviewed: 06/11/2020 Elsevier Patient Education  2023 [employer]  

## 2022-07-07 ENCOUNTER — Ambulatory Visit: Payer: Medicare Other | Admitting: Nurse Practitioner

## 2022-07-12 ENCOUNTER — Other Ambulatory Visit: Payer: Self-pay | Admitting: Physician Assistant

## 2022-07-12 DIAGNOSIS — J449 Chronic obstructive pulmonary disease, unspecified: Secondary | ICD-10-CM

## 2022-07-15 ENCOUNTER — Encounter: Payer: Self-pay | Admitting: Nurse Practitioner

## 2022-07-18 DIAGNOSIS — J449 Chronic obstructive pulmonary disease, unspecified: Secondary | ICD-10-CM | POA: Diagnosis not present

## 2022-07-22 ENCOUNTER — Ambulatory Visit: Payer: Medicare Other | Admitting: Nurse Practitioner

## 2022-07-28 DIAGNOSIS — I5033 Acute on chronic diastolic (congestive) heart failure: Secondary | ICD-10-CM | POA: Diagnosis not present

## 2022-07-28 DIAGNOSIS — J9621 Acute and chronic respiratory failure with hypoxia: Secondary | ICD-10-CM | POA: Diagnosis not present

## 2022-07-28 DIAGNOSIS — Z794 Long term (current) use of insulin: Secondary | ICD-10-CM | POA: Diagnosis not present

## 2022-07-28 DIAGNOSIS — I10 Essential (primary) hypertension: Secondary | ICD-10-CM | POA: Diagnosis not present

## 2022-07-28 DIAGNOSIS — I442 Atrioventricular block, complete: Secondary | ICD-10-CM | POA: Diagnosis not present

## 2022-07-28 DIAGNOSIS — J4489 Other specified chronic obstructive pulmonary disease: Secondary | ICD-10-CM | POA: Diagnosis not present

## 2022-07-28 DIAGNOSIS — G4730 Sleep apnea, unspecified: Secondary | ICD-10-CM | POA: Diagnosis not present

## 2022-07-28 DIAGNOSIS — I739 Peripheral vascular disease, unspecified: Secondary | ICD-10-CM | POA: Diagnosis not present

## 2022-07-28 DIAGNOSIS — E782 Mixed hyperlipidemia: Secondary | ICD-10-CM | POA: Diagnosis not present

## 2022-07-28 DIAGNOSIS — Z950 Presence of cardiac pacemaker: Secondary | ICD-10-CM | POA: Diagnosis not present

## 2022-07-28 DIAGNOSIS — I700 Atherosclerosis of aorta: Secondary | ICD-10-CM | POA: Diagnosis not present

## 2022-07-28 DIAGNOSIS — E119 Type 2 diabetes mellitus without complications: Secondary | ICD-10-CM | POA: Diagnosis not present

## 2022-08-06 ENCOUNTER — Encounter: Payer: Self-pay | Admitting: Nurse Practitioner

## 2022-08-06 ENCOUNTER — Ambulatory Visit (INDEPENDENT_AMBULATORY_CARE_PROVIDER_SITE_OTHER): Payer: Medicare Other | Admitting: Nurse Practitioner

## 2022-08-06 ENCOUNTER — Other Ambulatory Visit: Payer: Self-pay | Admitting: Internal Medicine

## 2022-08-06 ENCOUNTER — Encounter: Payer: Self-pay | Admitting: Hematology and Oncology

## 2022-08-06 VITALS — BP 141/83 | HR 90 | Temp 98.3°F | Resp 16 | Ht 59.0 in | Wt 130.4 lb

## 2022-08-06 DIAGNOSIS — I10 Essential (primary) hypertension: Secondary | ICD-10-CM

## 2022-08-06 DIAGNOSIS — E1165 Type 2 diabetes mellitus with hyperglycemia: Secondary | ICD-10-CM | POA: Diagnosis not present

## 2022-08-06 DIAGNOSIS — R300 Dysuria: Secondary | ICD-10-CM | POA: Diagnosis not present

## 2022-08-06 DIAGNOSIS — Z0001 Encounter for general adult medical examination with abnormal findings: Secondary | ICD-10-CM | POA: Diagnosis not present

## 2022-08-06 DIAGNOSIS — Z760 Encounter for issue of repeat prescription: Secondary | ICD-10-CM

## 2022-08-06 DIAGNOSIS — E782 Mixed hyperlipidemia: Secondary | ICD-10-CM

## 2022-08-06 DIAGNOSIS — J449 Chronic obstructive pulmonary disease, unspecified: Secondary | ICD-10-CM

## 2022-08-06 MED ORDER — HYDROCODONE-ACETAMINOPHEN 7.5-325 MG PO TABS: 1.0000 | ORAL_TABLET | Freq: Four times a day (QID) | ORAL | 0 refills | Status: DC | PRN

## 2022-08-06 MED ORDER — GABAPENTIN 300 MG PO CAPS: 300.0000 mg | ORAL_CAPSULE | Freq: Three times a day (TID) | ORAL | 3 refills | Status: DC

## 2022-08-06 NOTE — Progress Notes (Signed)
[facility] [place], [place] [address]  Internal MEDICINE  Office Visit Note  Patient Name: Angela Jensen  [id number]  [id number]  Date of Service: 08/06/2022  Chief Complaint  Patient presents with   Medicare Wellness   Depression   Diabetes   Hypertension   Hyperlipidemia    HPI Angela Jensen presents for an annual well visit and physical exam.  Well-appearing 66 y.o. female with hypertension, diabetes, COPD, high cholesterol and history of anemia.  Routine CRC screening: done in 2021, due again in 2026 Routine mammogram: done in February this year DEXA scan: done in February this year Eye exam: done this month Labs: up to date except needs to repeat cholesterol levels  New or worsening pain: only right shoulder  Other concerns: none      08/06/2022    1:57 PM  MMSE - Mini Mental State Exam  Orientation to time 5  Orientation to Place 5  Registration 3  Attention/ Calculation 5  Recall 3  Language- name 2 objects 2  Language- repeat 1  Language- follow 3 step command 3  Language- read & follow direction 1  Write a sentence 1  Copy design 1  Total score 30    Functional Status Survey: Is the patient deaf or have difficulty hearing?: Yes Does the patient have difficulty seeing, even when wearing glasses/contacts?: Yes Does the patient have difficulty concentrating, remembering, or making decisions?: No Does the patient have difficulty walking or climbing stairs?: Yes Does the patient have difficulty dressing or bathing?: No Does the patient have difficulty doing errands alone such as visiting a doctor's office or shopping?: No     03/10/2022   10:38 AM 06/05/2022    8:41 PM 06/09/2022    1:37 PM 06/22/2022    2:00 PM 08/06/2022    1:56 PM  [place] in the past year? 0  0  0  Was there an injury with Fall?   0    Fall Risk Category Calculator   0    Fall Risk Category   Low    Patient Fall Risk Level  Low fall risk Low fall risk  Low fall risk   Patient at Risk for Falls Due to   No Fall Risks    Fall risk Follow up   Falls evaluation completed         08/06/2022    2:04 PM  Depression screen PHQ 2/9  Decreased Interest 0  Down, Depressed, Hopeless 0  PHQ - 2 Score 0       Current Medication: Outpatient Encounter Medications as of 08/06/2022  Medication Sig   Accu-Chek Softclix Lancets lancets Use as instructed for twice a daily Dx E11.65   albuterol (PROVENTIL) (2.5 MG/3ML) 0.083% nebulizer solution USE 1 VIAL IN NEBULIZER EVERY 6 HOURS AS NEEDED FOR WHEEZING   albuterol (VENTOLIN HFA) 108 (90 Base) MCG/ACT inhaler INHALE 2 PUFFS BY MOUTH EVERY 6 HOURS AS NEEDED FOR WHEEZING OR SHORTNESS OF BREATH   ALPRAZolam (XANAX) 0.25 MG tablet TAKE 1 TABLET BY MOUTH THREE TIMES DAILY AS NEEDED FOR ANXIETY   Blood Glucose Monitoring Suppl (ACCU-CHEK GUIDE) w/Device KIT Use as directed Dx e11.65   clopidogrel (PLAVIX) 75 MG tablet Take 1 tablet by mouth once daily   Continuous Blood Gluc Receiver (DEXCOM G7 RECEIVER) DEVI Use one as directed for uncontrolled dm   Continuous Blood Gluc Sensor (DEXCOM G7 SENSOR) MISC Apply 1 sensor to skin on designated areas  every 10 days to monitor glucose levels with sensor or smart phone app   cyclobenzaprine (FLEXERIL) 10 MG tablet Take 1 tablet (10 mg total) by mouth at bedtime. Take one tab po qhs for back spasm prn only   diclofenac (VOLTAREN) 75 MG EC tablet Take 1 tablet (75 mg total) by mouth 2 (two) times daily.   doxycycline (VIBRA-TABS) 100 MG tablet Take 1 tablet (100 mg total) by mouth 2 (two) times daily.   escitalopram (LEXAPRO) 10 MG tablet Take 1 tablet (10 mg total) by mouth daily.   ferrous sulfate 325 (65 FE) MG tablet Take 325 mg by mouth 2 (two) times daily with a meal.   fluticasone (FLONASE) 50 MCG/ACT nasal spray Place 2 sprays into both nostrils daily.   furosemide (LASIX) 20 MG tablet Take 1 tablet (20 mg total) by mouth daily as needed.   gabapentin  (NEURONTIN) 300 MG capsule Take 1 capsule (300 mg total) by mouth 3 (three) times daily.   glucose blood (ACCU-CHEK GUIDE) test strip USE  STRIP TO CHECK GLUCOSE first thing in the morning, before meals and at bedtime (check glucose level a total of 5 times daily).   insulin detemir (LEVEMIR FLEXTOUCH) 100 UNIT/ML FlexPen Inject 12 Units into the skin daily with supper. Inject 8- 12 units with supper qd// with needles   lisinopril-hydrochlorothiazide (ZESTORETIC) 20-25 MG tablet Take 1 tablet by mouth daily.   metFORMIN (GLUCOPHAGE) 500 MG tablet Take 2 tablets (1,000 mg total) by mouth daily with breakfast.   metoprolol tartrate (LOPRESSOR) 25 MG tablet Take 1/2 (one-half) tablet by mouth twice daily   mometasone-formoterol (DULERA) 200-5 MCG/ACT AERO Inhale 2 puffs into the lungs 2 (two) times daily.   Omega-3 Fatty Acids (FISH OIL) 1000 MG CAPS Take 1 capsule by mouth daily.   OXYGEN Inhale 4 L into the lungs. PT USES [facility] FOR OXYGEN   potassium chloride (KLOR-CON) 10 MEQ tablet TAKE 1 TABLET BY MOUTH EVERY OTHER DAY   predniSONE (STERAPRED UNI-PAK 21 TAB) 10 MG (21) TBPK tablet As directed one dose pack for 6 days   RELION PEN NEEDLES 31G X 6 MM MISC USE WITH INSULIN PEN TO INJECT INSULIN ONCE DAILY AS DIRECTED   SPIRIVA HANDIHALER 18 MCG inhalation capsule INHALE 1 PUFF BY MOUTH ONCE DAILY. DO NOT SWALLOW CAPSULE   [DISCONTINUED] gabapentin (NEURONTIN) 100 MG capsule Take 1 capsule (100 mg total) by mouth 2 (two) times daily.   [DISCONTINUED] HYDROcodone-acetaminophen (NORCO) 7.5-325 MG tablet Take 1 tablet by mouth every 6 (six) hours as needed for severe pain.   famotidine (PEPCID) 20 MG tablet Take 1 tablet (20 mg total) by mouth daily for 14 days.   HYDROcodone-acetaminophen (NORCO) 7.5-325 MG tablet Take 1 tablet by mouth every 6 (six) hours as needed for severe pain.   No facility-administered encounter medications on file as of 08/06/2022.    Surgical History: Past Surgical  History:  Procedure Laterality Date   CESAREAN SECTION     x3   COLONOSCOPY WITH PROPOFOL N/A 06/25/2015   Procedure: COLONOSCOPY WITH PROPOFOL;  Surgeon: Lucilla Lame, MD;  Location: [facility];  Service: Endoscopy;  Laterality: N/A;   COLONOSCOPY WITH PROPOFOL N/A 07/26/2020   Procedure: COLONOSCOPY WITH PROPOFOL;  Surgeon: Lucilla Lame, MD;  Location: [place];  Service: Endoscopy;  Laterality: N/A;   CYST REMOVAL LEG     and on shoulder    ESOPHAGOGASTRODUODENOSCOPY (EGD) WITH PROPOFOL N/A 07/26/2020   Procedure: ESOPHAGOGASTRODUODENOSCOPY (EGD) WITH PROPOFOL;  Surgeon: Lucilla Lame, MD;  Location: [place];  Service: Endoscopy;  Laterality: N/A;  Diabetic - oral meds   LOWER EXTREMITY ANGIOGRAPHY Left 09/29/2018   Procedure: LOWER EXTREMITY ANGIOGRAPHY;  Surgeon: Algernon Huxley, MD;  Location: [place] [facility];  Service: Cardiovascular;  Laterality: Left;   LUNG BIOPSY  12/30/2011   has lung "spots"   PACEMAKER IMPLANT  07/14/2021   PACEMAKER LEADLESS INSERTION N/A 07/14/2021   Procedure: PACEMAKER LEADLESS INSERTION;  Surgeon: Isaias Cowman, MD;  Location: [place] [facility];  Service: Cardiovascular;  Laterality: N/A;   PERIPHERAL VASCULAR CATHETERIZATION Left 06/01/2016   Procedure: Lower Extremity Angiography;  Surgeon: Algernon Huxley, MD;  Location: [place] [facility];  Service: Cardiovascular;  Laterality: Left;   PERIPHERAL VASCULAR CATHETERIZATION N/A 06/01/2016   Procedure: Abdominal Aortogram w/Lower Extremity;  Surgeon: Algernon Huxley, MD;  Location: [place] [facility];  Service: Cardiovascular;  Laterality: N/A;   PERIPHERAL VASCULAR CATHETERIZATION  06/01/2016   Procedure: Lower Extremity Intervention;  Surgeon: Algernon Huxley, MD;  Location: [place] [facility];  Service: Cardiovascular;;   PERIPHERAL VASCULAR CATHETERIZATION Right 06/08/2016   Procedure: Lower Extremity Angiography;  Surgeon: Algernon Huxley, MD;  Location: [place] [facility];  Service: Cardiovascular;  Laterality: Right;   PERIPHERAL VASCULAR CATHETERIZATION  06/08/2016   Procedure: Lower Extremity Intervention;  Surgeon: Algernon Huxley, MD;  Location: [place] [facility];  Service: Cardiovascular;;   SUBMANDIBULAR GLAND EXCISION Left 12/06/2020   Procedure: EXCISION SUBMANDIBULAR GLAND;  Surgeon: Beverly Gust, MD;  Location: [place];  Service: ENT;  Laterality: Left;  needs to be first case Diabetic - diet controlled   TEMPORARY PACEMAKER N/A 07/11/2021   Procedure: TEMPORARY PACEMAKER;  Surgeon: Isaias Cowman, MD;  Location: [place] [facility];  Service: Cardiovascular;  Laterality: N/A;    Medical History: Past Medical History:  Diagnosis Date   Anemia    Arthritis    Asthma    Atherosclerosis of native arteries of extremity with intermittent claudication ([place]) 05/26/2016   Cancer ([place]) 2012   Right Lung CA   COPD (chronic obstructive pulmonary disease) ([place])    Depression    Diabetes mellitus without complication ([place])    Patient takes Janumet   Essential hypertension 05/26/2016   Heart failure ([place]) 2022   Hypercholesteremia    Oxygen dependent    2L at nite    PAD (peripheral artery disease) ([place]) 06/22/2016   Peripheral vascular disease (HCC)    Personal history of radiation therapy    Shortness of breath dyspnea    with exertion    Sialolithiasis    Sleep apnea    Wears dentures    full upper and lower    Family History: Family History  Problem Relation Age of Onset   Lung cancer Father    Diabetes Mother    Hypercholesterolemia Mother    Diabetes Sister    Diabetes Maternal Grandmother    Diabetes Paternal Grandmother    Diabetes Sister    Heart attack Brother    Coronary artery disease Brother    Vascular Disease Brother    Hypertension Sister    Heart attack Brother     Social History   Socioeconomic History   Marital status: Widowed    Spouse name: Not on file   Number of children: Not on  file   Years of education: Not on file   Highest education level: Not on file  Occupational History  Not on file  Tobacco Use   Smoking status: Former    Packs/day: 1.00    Years: 37.00    Total pack years: 37.00    Types: Cigarettes    Quit date: 02/06/2010    Years since quitting: 12.5   Smokeless tobacco: Former    Types: Snuff  Vaping Use   Vaping Use: Never used  Substance and Sexual Activity   Alcohol use: Not Currently    Alcohol/week: 5.0 standard drinks of alcohol    Types: 5 Cans of beer per week    Comment: /h x of alcohol abuse -stopped 2012- now drinks 5 beer per week   Drug use: Not Currently    Types: Marijuana, "Crack" cocaine, Cocaine    Comment: hx of cocaine use- last use 2015; last use marijuana6/22/19,   Sexual activity: Yes  Other Topics Concern   Not on file  Social History Narrative   Lives with Significant Other x 43 years   Social Determinants of Health   Financial Resource Strain: Not on file  Food Insecurity: Not on file  Transportation Needs: Not on file  Physical Activity: Not on file  Stress: Not on file  Social Connections: Not on file  Intimate Partner Violence: Not on file      Review of Systems  Constitutional:  Negative for activity change, appetite change, chills, fatigue, fever and unexpected weight change.  HENT: Negative.  Negative for congestion, ear pain, rhinorrhea, sore throat and trouble swallowing.   Eyes: Negative.   Respiratory: Negative.  Negative for cough, chest tightness, shortness of breath and wheezing.   Cardiovascular: Negative.  Negative for chest pain and palpitations.       Has pacemaker  Gastrointestinal: Negative.  Negative for abdominal pain, blood in stool, constipation, diarrhea, nausea and vomiting.  Endocrine: Negative.   Genitourinary: Negative.  Negative for difficulty urinating, dysuria, frequency, hematuria and urgency.  Musculoskeletal: Negative.  Negative for arthralgias, back pain, joint  swelling, myalgias and neck pain.  Skin: Negative.  Negative for rash and wound.  Allergic/Immunologic: Negative.  Negative for immunocompromised state.  Neurological: Negative.  Negative for dizziness, seizures, numbness and headaches.  Hematological: Negative.   Psychiatric/Behavioral: Negative.  Negative for behavioral problems, self-injury and suicidal ideas. The patient is not nervous/anxious.     Vital Signs: BP (!) 141/83   Pulse 90   Temp 98.3 F (36.8 C)   Resp 16   Ht _0  (1.499 m)   Wt 130 lb 6.4 oz (59.1 kg)   SpO2 90%   BMI 26.34 kg/m    Physical Exam Vitals reviewed.  Constitutional:      General: She is not in acute distress.    Appearance: Normal appearance. She is well-developed and normal weight. She is not ill-appearing or diaphoretic.  HENT:     Head: Normocephalic and atraumatic.     Right Ear: Tympanic membrane, ear canal and external ear normal.     Left Ear: Tympanic membrane, ear canal and external ear normal.     Nose: Nose normal. No congestion or rhinorrhea.     Mouth/Throat:     Mouth: Mucous membranes are moist.     Pharynx: Oropharynx is clear. No oropharyngeal exudate or posterior oropharyngeal erythema.  Eyes:     General: No scleral icterus.       Right eye: No discharge.        Left eye: No discharge.     Extraocular Movements: Extraocular movements intact.  Conjunctiva/sclera: Conjunctivae normal.     Pupils: Pupils are equal, round, and reactive to light.  Neck:     Thyroid: No thyromegaly.     Vascular: No carotid bruit or JVD.     Trachea: No tracheal deviation.  Cardiovascular:     Rate and Rhythm: Normal rate and regular rhythm.     Pulses: Normal pulses.     Heart sounds: Normal heart sounds. No murmur heard.    No friction rub. No gallop.  Pulmonary:     Effort: Pulmonary effort is normal. No respiratory distress.     Breath sounds: Normal breath sounds. No stridor. No wheezing or rales.  Chest:     Chest wall: No  tenderness.  Abdominal:     General: Bowel sounds are normal. There is no distension.     Palpations: Abdomen is soft. There is no mass.     Tenderness: There is no abdominal tenderness. There is no guarding or rebound.  Musculoskeletal:        General: No tenderness or deformity. Normal range of motion.     Cervical back: Normal range of motion and neck supple.  Lymphadenopathy:     Cervical: No cervical adenopathy.  Skin:    General: Skin is warm and dry.     Capillary Refill: Capillary refill takes less than 2 seconds.     Coloration: Skin is not pale.     Findings: No erythema or rash.  Neurological:     Mental Status: She is alert and oriented to person, place, and time.     Cranial Nerves: No cranial nerve deficit.     Motor: No abnormal muscle tone.     Coordination: Coordination normal.     Gait: Gait normal.     Deep Tendon Reflexes: Reflexes are normal and symmetric.  Psychiatric:        Mood and Affect: Mood normal.        Behavior: Behavior normal.        Thought Content: Thought content normal.        Judgment: Judgment normal.        Assessment/Plan: 1. Encounter for routine adult health examination with abnormal findings Age-appropriate preventive screenings and vaccinations discussed, annual physical exam completed. Routine labs for health maintenance ordered, see below. PHM updated.   2. Uncontrolled type 2 diabetes mellitus with hyperglycemia (HCC) No changes, continue medications as prescribed. Urine specimen sent for microalbumin - Urine Microalbumin w/creat. ratio  3. Essential hypertension Stable, continue current medications as prescribed.   4. Mixed hyperlipidemia Routine lab ordered - Lipid Profile  5. Dysuria Routine urinalysis done  - UA/M w/rflx Culture, Routine  6. Medication refill - HYDROcodone-acetaminophen (NORCO) 7.5-325 MG tablet; Take 1 tablet by mouth every 6 (six) hours as needed for severe pain.  Dispense: 20 tablet; Refill:  0 - gabapentin (NEURONTIN) 300 MG capsule; Take 1 capsule (300 mg total) by mouth 3 (three) times daily.  Dispense: 90 capsule; Refill: 3      General Counseling: Angela Jensen verbalizes understanding of the findings of todays visit and agrees with plan of treatment. I have discussed any further diagnostic evaluation that may be needed or ordered today. We also reviewed her medications today. she has been encouraged to call the office with any questions or concerns that should arise related to todays visit.    Orders Placed This Encounter  Procedures   UA/M w/rflx Culture, Routine   Urine Microalbumin w/creat. ratio   Lipid Profile  Meds ordered this encounter  Medications   HYDROcodone-acetaminophen (NORCO) 7.5-325 MG tablet    Sig: Take 1 tablet by mouth every 6 (six) hours as needed for severe pain.    Dispense:  20 tablet    Refill:  0    Please fill today   gabapentin (NEURONTIN) 300 MG capsule    Sig: Take 1 capsule (300 mg total) by mouth 3 (three) times daily.    Dispense:  90 capsule    Refill:  3    Not increased dose, discontinue previous ordered for gabapentin, fill this script asap.    Return in about 2 months (around 10/07/2022) for F/U, Recheck A1C, Quinci Gavidia PCP.   Total time spent:30 Minutes Time spent includes review of chart, medications, test results, and follow up plan with the patient.   [place] Controlled Substance Database was reviewed by me.  This patient was seen by Jonetta Osgood, FNP-C in collaboration with Dr. Clayborn Bigness as a part of collaborative care agreement.  Vishwa Dais R. Valetta Fuller, MSN, FNP-C Internal medicine

## 2022-08-07 LAB — UA/M W/RFLX CULTURE, ROUTINE
Bilirubin, UA: NEGATIVE
Glucose, UA: NEGATIVE
Leukocytes,UA: NEGATIVE
Nitrite, UA: NEGATIVE
Protein,UA: NEGATIVE
RBC, UA: NEGATIVE
Specific Gravity, UA: 1.024 (ref 1.005–1.030)
Urobilinogen, Ur: 0.2 mg/dL (ref 0.2–1.0)
pH, UA: 5 (ref 5.0–7.5)

## 2022-08-07 LAB — MICROSCOPIC EXAMINATION
Bacteria, UA: NONE SEEN
Casts: NONE SEEN /lpf
RBC, Urine: NONE SEEN /hpf (ref 0–2)
WBC, UA: NONE SEEN /hpf (ref 0–5)

## 2022-08-07 LAB — MICROALBUMIN / CREATININE URINE RATIO
Creatinine, Urine: 150.7 mg/dL
Microalb/Creat Ratio: 6 mg/g creat (ref 0–29)
Microalbumin, Urine: 8.6 ug/mL

## 2022-08-18 DIAGNOSIS — J449 Chronic obstructive pulmonary disease, unspecified: Secondary | ICD-10-CM | POA: Diagnosis not present

## 2022-08-19 ENCOUNTER — Other Ambulatory Visit: Payer: Self-pay | Admitting: Nurse Practitioner

## 2022-08-26 ENCOUNTER — Other Ambulatory Visit: Payer: Self-pay | Admitting: Nurse Practitioner

## 2022-08-26 ENCOUNTER — Other Ambulatory Visit (INDEPENDENT_AMBULATORY_CARE_PROVIDER_SITE_OTHER): Payer: Self-pay | Admitting: Nurse Practitioner

## 2022-08-31 ENCOUNTER — Other Ambulatory Visit: Payer: Self-pay | Admitting: Nurse Practitioner

## 2022-09-14 ENCOUNTER — Other Ambulatory Visit: Payer: Self-pay | Admitting: Nurse Practitioner

## 2022-09-14 DIAGNOSIS — Z1231 Encounter for screening mammogram for malignant neoplasm of breast: Secondary | ICD-10-CM

## 2022-09-18 ENCOUNTER — Other Ambulatory Visit: Payer: Self-pay | Admitting: Nurse Practitioner

## 2022-09-18 DIAGNOSIS — E1165 Type 2 diabetes mellitus with hyperglycemia: Secondary | ICD-10-CM

## 2022-09-18 DIAGNOSIS — J449 Chronic obstructive pulmonary disease, unspecified: Secondary | ICD-10-CM | POA: Diagnosis not present

## 2022-09-21 ENCOUNTER — Ambulatory Visit
Admission: RE | Admit: 2022-09-21 | Discharge: 2022-09-21 | Disposition: A | Discharge status: Home / Self Care | Payer: Medicare Other | Source: Ambulatory Visit | Attending: Oncology | Admitting: Oncology

## 2022-09-21 DIAGNOSIS — S2231XA Fracture of one rib, right side, initial encounter for closed fracture: Secondary | ICD-10-CM | POA: Diagnosis not present

## 2022-09-21 DIAGNOSIS — C3491 Malignant neoplasm of unspecified part of right bronchus or lung: Secondary | ICD-10-CM | POA: Insufficient documentation

## 2022-09-21 MED ORDER — IOHEXOL 300 MG/ML  SOLN
75.0000 mL | Freq: Once | INTRAMUSCULAR | Status: AC | PRN
  Administered 2022-09-21: 75 mL via INTRAVENOUS

## 2022-09-22 ENCOUNTER — Telehealth: Payer: Self-pay | Admitting: *Deleted

## 2022-09-22 NOTE — Telephone Encounter (Signed)
Called report  IMPRESSION: 1. Nondisplaced mildly-comminuted fracture of the posterolateral right ninth rib. 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 No pneumothorax. 2. Stable examination demonstrating chronic postradiation changes in the right lung with no definitive evidence to suggest locally recurrent disease or definite metastatic disease in the thorax. 3. Diffuse bronchial wall thickening with moderate centrilobular and paraseptal emphysema; imaging findings suggestive of underlying COPD. 4. Aortic atherosclerosis, in addition to left main and left anterior descending coronary artery disease. Please note that although the presence of coronary artery calcium documents the presence of coronary artery disease, the severity of this disease and any potential stenosis cannot be assessed on this non-gated CT examination. Assessment for potential risk factor modification, dietary therapy or pharmacologic therapy may be warranted, if clinically indicated. 5. There are calcifications of the aortic valve. Echocardiographic correlation for evaluation of potential valvular dysfunction may be warranted if clinically indicated.   These results will be called to the ordering clinician or representative by the Radiologist Assistant, and communication documented in the PACS or [employer].   Aortic Atherosclerosis (ICD10-I70.0) and Emphysema (ICD10-J43.9).     Electronically Signed   By: Vinnie Langton M.D.   On: 09/22/2022 08:59

## 2022-09-23 ENCOUNTER — Other Ambulatory Visit: Payer: Self-pay | Admitting: *Deleted

## 2022-09-23 ENCOUNTER — Inpatient Hospital Stay: Payer: Medicare Other | Attending: Radiation Oncology

## 2022-09-23 ENCOUNTER — Encounter: Payer: Self-pay | Admitting: Oncology

## 2022-09-23 ENCOUNTER — Ambulatory Visit
Admission: RE | Admit: 2022-09-23 | Discharge: 2022-09-23 | Disposition: A | Discharge status: Home / Self Care | Payer: Medicare Other | Source: Ambulatory Visit | Attending: Radiation Oncology | Admitting: Radiation Oncology

## 2022-09-23 ENCOUNTER — Inpatient Hospital Stay (HOSPITAL_BASED_OUTPATIENT_CLINIC_OR_DEPARTMENT_OTHER): Payer: Medicare Other | Admitting: Oncology

## 2022-09-23 ENCOUNTER — Encounter: Payer: Self-pay | Admitting: Radiation Oncology

## 2022-09-23 VITALS — BP 113/78 | HR 95 | Temp 97.9°F | Resp 18 | Wt 129.2 lb

## 2022-09-23 DIAGNOSIS — D509 Iron deficiency anemia, unspecified: Secondary | ICD-10-CM

## 2022-09-23 DIAGNOSIS — J9611 Chronic respiratory failure with hypoxia: Secondary | ICD-10-CM | POA: Diagnosis not present

## 2022-09-23 DIAGNOSIS — S2231XA Fracture of one rib, right side, initial encounter for closed fracture: Secondary | ICD-10-CM | POA: Diagnosis not present

## 2022-09-23 DIAGNOSIS — Z85118 Personal history of other malignant neoplasm of bronchus and lung: Secondary | ICD-10-CM

## 2022-09-23 DIAGNOSIS — C3491 Malignant neoplasm of unspecified part of right bronchus or lung: Secondary | ICD-10-CM

## 2022-09-23 DIAGNOSIS — Z801 Family history of malignant neoplasm of trachea, bronchus and lung: Secondary | ICD-10-CM | POA: Diagnosis not present

## 2022-09-23 DIAGNOSIS — Z923 Personal history of irradiation: Secondary | ICD-10-CM | POA: Insufficient documentation

## 2022-09-23 DIAGNOSIS — I110 Hypertensive heart disease with heart failure: Secondary | ICD-10-CM | POA: Insufficient documentation

## 2022-09-23 DIAGNOSIS — E119 Type 2 diabetes mellitus without complications: Secondary | ICD-10-CM

## 2022-09-23 DIAGNOSIS — Z87891 Personal history of nicotine dependence: Secondary | ICD-10-CM | POA: Diagnosis not present

## 2022-09-23 DIAGNOSIS — C3431 Malignant neoplasm of lower lobe, right bronchus or lung: Secondary | ICD-10-CM | POA: Diagnosis not present

## 2022-09-23 DIAGNOSIS — Z9981 Dependence on supplemental oxygen: Secondary | ICD-10-CM | POA: Insufficient documentation

## 2022-09-23 DIAGNOSIS — I509 Heart failure, unspecified: Secondary | ICD-10-CM | POA: Diagnosis not present

## 2022-09-23 DIAGNOSIS — I10 Essential (primary) hypertension: Secondary | ICD-10-CM | POA: Diagnosis not present

## 2022-09-23 DIAGNOSIS — E782 Mixed hyperlipidemia: Secondary | ICD-10-CM | POA: Diagnosis not present

## 2022-09-23 LAB — CBC WITH DIFFERENTIAL/PLATELET
Abs Immature Granulocytes: 0.08 10*3/uL — ABNORMAL HIGH (ref 0.00–0.07)
Basophils Absolute: 0 10*3/uL (ref 0.0–0.1)
Basophils Relative: 1 %
Eosinophils Absolute: 0.1 10*3/uL (ref 0.0–0.5)
Eosinophils Relative: 2 %
HCT: 35.6 % — ABNORMAL LOW (ref 36.0–46.0)
Hemoglobin: 10.6 g/dL — ABNORMAL LOW (ref 12.0–15.0)
Immature Granulocytes: 1 %
Lymphocytes Relative: 19 %
Lymphs Abs: 1.1 10*3/uL (ref 0.7–4.0)
MCH: 28.3 pg (ref 26.0–34.0)
MCHC: 29.8 g/dL — ABNORMAL LOW (ref 30.0–36.0)
MCV: 95.2 fL (ref 80.0–100.0)
Monocytes Absolute: 0.6 10*3/uL (ref 0.1–1.0)
Monocytes Relative: 10 %
Neutro Abs: 3.8 10*3/uL (ref 1.7–7.7)
Neutrophils Relative %: 67 %
Platelets: 325 10*3/uL (ref 150–400)
RBC: 3.74 MIL/uL — ABNORMAL LOW (ref 3.87–5.11)
RDW: 14.6 % (ref 11.5–15.5)
WBC: 5.6 10*3/uL (ref 4.0–10.5)
nRBC: 0 % (ref 0.0–0.2)

## 2022-09-23 LAB — COMPREHENSIVE METABOLIC PANEL
ALT: 16 U/L (ref 0–44)
AST: 22 U/L (ref 15–41)
Albumin: 4.2 g/dL (ref 3.5–5.0)
Alkaline Phosphatase: 62 U/L (ref 38–126)
Anion gap: 11 (ref 5–15)
BUN: 22 mg/dL (ref 8–23)
CO2: 32 mmol/L (ref 22–32)
Calcium: 9.4 mg/dL (ref 8.9–10.3)
Chloride: 94 mmol/L — ABNORMAL LOW (ref 98–111)
Creatinine, Ser: 0.89 mg/dL (ref 0.44–1.00)
GFR, Estimated: >60 mL/min (ref >60)
Glucose, Bld: 314 mg/dL — ABNORMAL HIGH (ref 70–99)
Potassium: 3.7 mmol/L (ref 3.5–5.1)
Sodium: 137 mmol/L (ref 135–145)
Total Bilirubin: 0.3 mg/dL (ref 0.3–1.2)
Total Protein: 7.8 g/dL (ref 6.5–8.1)

## 2022-09-23 MED ORDER — TRAMADOL HCL 50 MG PO TABS: 50.0000 mg | ORAL_TABLET | Freq: Two times a day (BID) | ORAL | 0 refills | Status: DC | PRN

## 2022-09-23 NOTE — Progress Notes (Signed)
Hematology/Oncology Progress note [phone] [phone]     Chief Complaint: Angela Jensen is a 67 y.o. 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 -Recurrence-SBRT completed 06/11/2020.NGS showed TMB >10 muts/mb, MS stable, KRAS G12V, MYC amplification,STK11 [id number]*96, FUBP1 splice sit 759+1M>B February 2024 CT scan results reviewed and discussed with patient No definite evidence of cancer recurrence.  Recommend continued surveillance CT scan in 6 months.  Right rib fracture Pain control, tramadol 50 mg every 12 hours as needed as needed. Recommend patient to take calcium and vitamin D supplementation.  Chronic respiratory failure with hypoxia (HCC) Stable.  Continue oxygen supplementation.  Iron deficiency anemia Hemoglobin is stable.  Check iron panel at the next visit.  Orders Placed This Encounter  Procedures   CT Chest W Contrast    Standing Status:   Future    Standing Expiration Date:   09/23/2023    Order Specific Question:   If indicated for the ordered procedure, I authorize the administration of contrast media per Radiology protocol    Answer:   Yes    Order Specific Question:   Does the patient have a contrast media/X-ray dye allergy?    Answer:   No    Order Specific Question:   Preferred imaging location?    Answer:   [facility]   CBC with Differential/Platelet    Standing Status:   Future    Standing Expiration Date:   09/23/2023   Comprehensive metabolic panel    Standing Status:   Future    Standing Expiration Date:   09/23/2023   Follow-up in 6 months All questions were answered. The patient knows to call the clinic with any problems, questions or concerns.  Earlie Server, MD, PhD [facility]  Health Hematology Oncology 09/23/2022    PERTINENT ONCOLOGY HISTORY Patient previously followed up by Dr.Corcoran, patient switched care to me on 03/20/21 Extensive medical record review was performed by me  # Stage I RUL adenocarcinoma  s/p IMRT in 02/2012.   She was not a good surgery candidate secondary to her lung function (FEV1 28%).  CT guided biopsy of the superior and inferior RUL nodules on 12/30/2011 revealed well differentiated adenocarcinoma of lung primary.  EGFR testing revealed no mutation.  She received IMRT 6000 cGy to both nodules from 01/26/2012 - 03/10/2012.   In 04/2015, there was an attempt at radiofrequency ablation of 1 enlarging RUL nodule.  At the time of the procedure, the nodule had shrunk to 5 mm from 9 mm.     # Stage I RLL adenocarcinoma 2019   CT guided RLL biopsy on 02/09/2018 revealed adenocarcinoma of lung origin, lepidic and papillary patterns.   She received 6000 cGy in 5 fractions to RLL nodule from 03/22/2018 - 04/05/2018.   Chest CT on 03/04/2020 revealed enlarging and new pulmonary nodules suspicious for metastatic disease or metachronous primary.  PET scan on 03/19/2020 revealed that the right lower lobe pulmonary nodules did not demonstrate any hypermetabolism to suggest neoplasm. However, these were somewhat worrisome; recommendation was close CT surveillance. There was no mediastinal or hilar lymphadenopathy.  Chest CT  on 09/11/2020 revealed interval treatment of the right lower lobe nodules. These nodules were partly obscured by adjacent parenchymal opacity, although appear decreased in size.  CT guided  RLL biopsy on 04/24/2020 revealed adenocarcinoma of the lung with papillary and solid patterns. 05/28/2020 - 06/11/2020  SBRT to the right lung. She received 60 Gy in 5 fractions.  She has an elevated CEA of unclear etiology.  CEA was 8.1 on 01/31/2016, 9.2 on 03/03/2016, 7.9 on 10/12/2017, and 7.9 on 01/19/2019.   Colonoscopy on 06/25/2015  revealed a 4 mm polyp in the sigmoid colon.  Pathology revealed a tubular adenoma without evidence of dysplasia or malignancy.   Pelvic MRI on 04/30/2016 revealed a 3.4 x 2.5 cm ovoid mass in the LEFT adnexa with signal characteristics identical to the intramural leiomyomas and consistent with an exophytic leiomyoma.  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  Endometrium was normal with a normal junctional zone.  Ovaries were not identified.  # iron deficiency anemia.  She required 2 units of PRBCs on 04/04/2020.  Colonoscopy on 06/25/2015 revealed one 4 mm polyp (tubular adenoma) in the sigmoid colon. There were non-bleeding internal hemorrhoids.  Colonoscopy on 07/26/2020 revealed diverticulosis in the entire examined colon. There were non-bleeding internal hemorrhoids. EGD on 07/26/2020 revealed erosive gastropathy with no bleeding and no stigmata of recent bleeding.   07/11/2021, patient was hospitalized and a CT angio chest PE with contrast was ordered and there was slight increase in size".  3 over several months of larger than 1 cm nodular-like densities along the right minor and major fissure in the region of prior radiation therapy.  Recurrent malignancy cannot be excluded.   12/29 2022, a PET scan was obtained for further evaluation.  PET scan showed stable scarring type changes involving the right lung.  No hypermetabolic pulmonary lesions to suggest recurrent or metastatic tumor.  02/20/2022, CT chest with contrast showed continued interval progression of the consolidative plaque-like opacity in the right lower lobe.  This area was previously FDG negative on PET scan in December 2022.  New 5 mm left lower lobe pulmonary nodule.     INTERVAL HISTORY Angela Jensen is a 67 y.o. female who has above history reviewed by me today presents for follow up visit for iron deficiency anemia, history of lung cancer Today patient reports feeling well.  She has no new complaints today. Patient has right  chest wall rib cage pain, usually trigger by certain position.  Patient reports pain medication stronger than Tylenol. Otherwise no new complaints.  09/21/2022, CT chest with contrast showed 1. Nondisplaced mildly-comminuted fracture of the posterolateral right ninth rib. 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 No pneumothorax. 2. Stable examination demonstrating chronic postradiation changes in the right lung with no definitive evidence to suggest locally recurrent disease or definite metastatic disease in the thorax.  3. Diffuse bronchial wall thickening with moderate centrilobular and paraseptal emphysema; imaging findings suggestive of underlying COPD. 4. Aortic atherosclerosis,   Past Medical History:  Diagnosis Date   Anemia    Arthritis    Asthma    Atherosclerosis of native arteries of extremity with intermittent claudication ([place]) 05/26/2016   Cancer ([place]) 2012   Right Lung CA   COPD (chronic obstructive pulmonary disease) ([place])    Depression    Diabetes mellitus without complication ([place])    Patient takes Janumet   Essential hypertension 05/26/2016   Heart failure ([place]) 2022   Hypercholesteremia    Oxygen dependent    2L at nite    PAD (peripheral artery disease) ([place]) 06/22/2016   Peripheral vascular disease ([place])  Personal history of radiation therapy    Shortness of breath dyspnea    with exertion    Sialolithiasis    Sleep apnea    Wears dentures    full upper and lower    Past Surgical History:  Procedure Laterality Date   CESAREAN SECTION     x3   COLONOSCOPY WITH PROPOFOL N/A 06/25/2015   Procedure: COLONOSCOPY WITH PROPOFOL;  Surgeon: Lucilla Lame, MD;  Location: [facility];  Service: Endoscopy;  Laterality: N/A;   COLONOSCOPY WITH PROPOFOL N/A 07/26/2020   Procedure: COLONOSCOPY WITH PROPOFOL;  Surgeon: Lucilla Lame, MD;  Location: [place];  Service: Endoscopy;  Laterality: N/A;   CYST REMOVAL LEG     and on shoulder    ESOPHAGOGASTRODUODENOSCOPY (EGD) WITH PROPOFOL N/A 07/26/2020   Procedure: ESOPHAGOGASTRODUODENOSCOPY (EGD) WITH PROPOFOL;  Surgeon: Lucilla Lame, MD;  Location: [facility];  Service: Endoscopy;  Laterality: N/A;  Diabetic - oral meds   LOWER EXTREMITY ANGIOGRAPHY Left 09/29/2018   Procedure: LOWER EXTREMITY ANGIOGRAPHY;  Surgeon: Algernon Huxley, MD;  Location: [place] [facility];  Service: Cardiovascular;  Laterality: Left;   LUNG BIOPSY  12/30/2011   has lung "spots"   PACEMAKER IMPLANT  07/14/2021   PACEMAKER LEADLESS INSERTION N/A 07/14/2021   Procedure: PACEMAKER LEADLESS INSERTION;  Surgeon: Isaias Cowman, MD;  Location: [place] [facility];  Service: Cardiovascular;  Laterality: N/A;   PERIPHERAL VASCULAR CATHETERIZATION Left 06/01/2016   Procedure: Lower Extremity Angiography;  Surgeon: Algernon Huxley, MD;  Location: [place] [facility];  Service: Cardiovascular;  Laterality: Left;   PERIPHERAL VASCULAR CATHETERIZATION N/A 06/01/2016   Procedure: Abdominal Aortogram w/Lower Extremity;  Surgeon: Algernon Huxley, MD;  Location: [place] [facility];  Service: Cardiovascular;  Laterality: N/A;   PERIPHERAL VASCULAR CATHETERIZATION  06/01/2016   Procedure: Lower Extremity Intervention;  Surgeon: Algernon Huxley, MD;  Location: [place] [facility];  Service: Cardiovascular;;   PERIPHERAL VASCULAR CATHETERIZATION Right 06/08/2016   Procedure: Lower Extremity Angiography;  Surgeon: Algernon Huxley, MD;  Location: [place] [facility];  Service: Cardiovascular;  Laterality: Right;   PERIPHERAL VASCULAR CATHETERIZATION  06/08/2016   Procedure: Lower Extremity Intervention;  Surgeon: Algernon Huxley, MD;  Location: [facility] [facility];  Service: Cardiovascular;;   SUBMANDIBULAR GLAND EXCISION Left 12/06/2020   Procedure: EXCISION SUBMANDIBULAR GLAND;  Surgeon: Beverly Gust, MD;  Location: [facility];  Service: ENT;  Laterality: Left;  needs to be first case Diabetic - diet controlled   TEMPORARY PACEMAKER N/A 07/11/2021   Procedure: TEMPORARY PACEMAKER;  Surgeon: Isaias Cowman, MD;  Location: [place] [facility];  Service: Cardiovascular;  Laterality: N/A;    Family History  Problem Relation Age of Onset   Lung cancer Father    Diabetes Mother    Hypercholesterolemia Mother    Diabetes Sister    Diabetes Maternal Grandmother    Diabetes Paternal Grandmother    Diabetes Sister    Heart attack Brother    Coronary artery disease Brother    Vascular Disease Brother    Hypertension Sister    Heart attack Brother     Social History:  reports that she quit smoking about 12 years ago. Her smoking use included cigarettes. She has a 37.00 pack-year smoking history. She has quit using smokeless tobacco.  Her smokeless tobacco use included snuff. She reports that she does not currently use alcohol after a past usage of about 5.0 standard drinks of alcohol per  week. She reports that she does not currently use drugs after having used the following drugs: Marijuana, "Crack" cocaine, and Cocaine.   Allergies:  Allergies  Allergen Reactions   Trelegy Ellipta [Fluticasone-Umeclidin-Vilant]     Had breathing issues    Current Medications: Current Outpatient Medications  Medication Sig Dispense Refill   Accu-Chek Softclix Lancets lancets Use as instructed for twice a daily Dx E11.65 100 each 1   albuterol (PROVENTIL) (2.5 MG/3ML) 0.083% nebulizer solution USE 1 VIAL IN NEBULIZER EVERY 6 HOURS AS NEEDED FOR WHEEZING 1080 mL 1   albuterol (VENTOLIN HFA) 108 (90 Base) MCG/ACT inhaler INHALE 2 PUFFS BY MOUTH EVERY 6 HOURS AS NEEDED FOR WHEEZING OR SHORTNESS OF BREATH 9 g 11   ALPRAZolam (XANAX) 0.25 MG tablet TAKE 1 TABLET BY MOUTH THREE TIMES DAILY AS NEEDED FOR ANXIETY 90 tablet 2   Blood Glucose Monitoring Suppl (ACCU-CHEK GUIDE) w/Device KIT Use as directed Dx e11.65 1 kit 0    clopidogrel (PLAVIX) 75 MG tablet Take 1 tablet by mouth once daily 90 tablet 3   Continuous Blood Gluc Receiver (DEXCOM G7 RECEIVER) DEVI Use one as directed for uncontrolled dm 1 each 0   Continuous Blood Gluc Sensor (DEXCOM G7 SENSOR) MISC Apply 1 sensor to skin on designated areas every 10 days to monitor glucose levels with sensor or smart phone app 3 each 11   cyclobenzaprine (FLEXERIL) 10 MG tablet Take 1 tablet (10 mg total) by mouth at bedtime. Take one tab po qhs for back spasm prn only 30 tablet 1   diclofenac (VOLTAREN) 75 MG EC tablet Take 1 tablet (75 mg total) by mouth 2 (two) times daily. 60 tablet 2   doxycycline (VIBRA-TABS) 100 MG tablet Take 1 tablet (100 mg total) by mouth 2 (two) times daily. 20 tablet 0   escitalopram (LEXAPRO) 10 MG tablet Take 1 tablet (10 mg total) by mouth daily. 90 tablet 1   famotidine (PEPCID) 20 MG tablet Take 1 tablet (20 mg total) by mouth daily for 14 days. 14 tablet 0   ferrous sulfate 325 (65 FE) MG tablet Take 325 mg by mouth 2 (two) times daily with a meal.     furosemide (LASIX) 20 MG tablet Take 1 tablet (20 mg total) by mouth daily as needed. 30 tablet 5   gabapentin (NEURONTIN) 300 MG capsule Take 1 capsule (300 mg total) by mouth 3 (three) times daily. 90 capsule 3   glucose blood (ACCU-CHEK GUIDE) test strip USE  STRIP TO CHECK GLUCOSE first thing in the morning, before meals and at bedtime (check glucose level a total of 5 times daily). 500 each 3   insulin detemir (LEVEMIR FLEXTOUCH) 100 UNIT/ML FlexPen Inject 12 Units into the skin daily with supper. Inject 8- 12 units with supper qd// with needles 6 mL 11   lisinopril-hydrochlorothiazide (ZESTORETIC) 20-25 MG tablet Take 1 tablet by mouth daily.     metFORMIN (GLUCOPHAGE) 500 MG tablet TAKE 2 TABLETS BY MOUTH ONCE DAILY WITH BREAKFAST 180 tablet 0   metoprolol tartrate (LOPRESSOR) 25 MG tablet Take 1/2 (one-half) tablet by mouth twice daily 90 tablet 1   mometasone-formoterol (DULERA)  200-5 MCG/ACT AERO Inhale 2 puffs into the lungs 2 (two) times daily. 13 g 11   Omega-3 Fatty Acids (FISH OIL) 1000 MG CAPS Take 1 capsule by mouth daily.     OXYGEN Inhale 4 L into the lungs. PT USES [facility] FOR OXYGEN     potassium chloride (KLOR-CON) 10  MEQ tablet TAKE 1 TABLET BY MOUTH EVERY OTHER DAY 45 tablet 0   RELION PEN NEEDLES 31G X 6 MM MISC USE WITH INSULIN PEN TO INJECT INSULIN ONCE DAILY AS DIRECTED 50 each 0   SPIRIVA HANDIHALER 18 MCG inhalation capsule INHALE 1 PUFF BY MOUTH ONCE DAILY. DO NOT SWALLOW CAPSULE 90 capsule 0   traMADol (ULTRAM) 50 MG tablet Take 1 tablet (50 mg total) by mouth every 12 (twelve) hours as needed. 90 tablet 0   fluticasone (FLONASE) 50 MCG/ACT nasal spray Place 2 sprays into both nostrils daily. (Patient not taking: Reported on 09/23/2022) 9.9 mL 2   No current facility-administered medications for this visit.    Review of Systems  Constitutional:  Negative for chills, diaphoresis, fever, malaise/fatigue and weight loss.  HENT:  Negative for congestion, ear discharge, ear pain, hearing loss, nosebleeds, sinus pain, sore throat and tinnitus.   Eyes: Negative.  Negative for blurred vision.  Respiratory:  Negative for cough, hemoptysis, sputum production and shortness of breath.        Patient is on nasal cannula oxygen  Cardiovascular:  Negative for chest pain, palpitations and leg swelling.  Gastrointestinal: Negative.  Negative for abdominal pain, blood in stool, constipation, diarrhea, heartburn, melena, nausea and vomiting.  Genitourinary: Negative.  Negative for dysuria, frequency, hematuria and urgency.  Musculoskeletal:  Negative for back pain, joint pain, myalgias and neck pain.  Skin: Negative.  Negative for itching and rash.  Neurological: Negative.  Negative for dizziness, tingling, sensory change, speech change, focal weakness, weakness and headaches.  Endo/Heme/Allergies: Negative.  Does not bruise/bleed easily.       Diabetes,  under good control  Psychiatric/Behavioral: Negative.  Negative for depression and memory loss. The patient is not nervous/anxious and does not have insomnia.   All other systems reviewed and are negative.   Performance status (ECOG): 0-1  Vitals:  Blood pressure 113/78, pulse 95, temperature 97.9 F (36.6 C), resp. rate 18, weight 129 lb 3.2 oz (58.6 kg), SpO2 90 %, peak flow (!) 2 L/min. Physical Exam Constitutional:      General: She is not in acute distress.    Appearance: She is not diaphoretic.  HENT:     Head: Normocephalic and atraumatic.     Nose: Nose normal.     Mouth/Throat:     Pharynx: No oropharyngeal exudate.  Eyes:     General: No scleral icterus.    Pupils: Pupils are equal, round, and reactive to light.  Cardiovascular:     Rate and Rhythm: Normal rate.  Pulmonary:     Effort: Pulmonary effort is normal. No respiratory distress.     Breath sounds: No wheezing.     Comments: Decreased breath sound bilaterally. Breathing is comfortable on nasal cannula oxygen Abdominal:     General: There is no distension.     Palpations: Abdomen is soft.     Tenderness: There is no abdominal tenderness.  Musculoskeletal:        General: Normal range of motion.     Cervical back: Normal range of motion and neck supple.  Skin:    General: Skin is warm and dry.     Findings: No erythema.  Neurological:     Mental Status: She is alert and oriented to person, place, and time.     Cranial Nerves: No cranial nerve deficit.     Motor: No abnormal muscle tone.     Coordination: Coordination normal.  Psychiatric:  Mood and Affect: Affect normal.      RADIOGRAPHIC STUDIES: I have personally reviewed the radiological images as listed and agreed with the findings in the report. CT Chest W Contrast  Result Date: 09/22/2022 CLINICAL DATA:  67 year old female with history of lung cancer presenting with new onset of shortness of breath and chest pain. * Tracking Code: BO *  EXAM: CT CHEST WITH CONTRAST TECHNIQUE: Multidetector CT imaging of the chest was performed during intravenous contrast administration. RADIATION DOSE REDUCTION: This exam was performed according to the departmental dose-optimization program which includes automated exposure control, adjustment of the mA and/or kV according to patient size and/or use of iterative reconstruction technique. CONTRAST:  36mL OMNIPAQUE IOHEXOL 300 MG/ML  SOLN COMPARISON:  Chest CTA 06/06/2022.  PET-CT 03/18/2022. FINDINGS: Cardiovascular: Heart size is normal. There is no significant pericardial fluid, thickening or pericardial calcification. There is aortic atherosclerosis, as well as atherosclerosis of the great vessels of the mediastinum and the coronary arteries, including calcified atherosclerotic plaque in the left main and left anterior descending coronary arteries. Mild thickening and calcification of the aortic valve. Mediastinum/Nodes: No pathologically enlarged mediastinal or hilar lymph nodes. Esophagus is unremarkable in appearance. No axillary lymphadenopathy. Lungs/Pleura: Again noted are areas of ground-glass attenuation, septal thickening and nodular and mass-like architectural distortion in the right lower lobe and lateral segment of the right middle lobe, similar to prior studies, most compatible with areas of chronic postradiation mass-like fibrosis. An area of chronic architectural distortion is also noted in the right upper lobe (axial image 28 of series 4), also similar to prior examinations, presumably an area of chronic scarring. A few scattered small pulmonary nodules are again noted, similar in number and size to the prior study, with the largest of these measuring 5 x 3 mm (mean diameter 4 mm) in the left upper lobe (axial image 43 of series 4). No other definite new suspicious appearing pulmonary nodules or masses are noted. No acute consolidative airspace disease. No pleural effusions. Diffuse bronchial wall  thickening with moderate centrilobular and paraseptal emphysema. Upper Abdomen: Aortic atherosclerosis. Musculoskeletal: Area of mixed lucency and sclerosis in the posterolateral aspect of the right ninth rib where there is a nondisplaced mildly comminuted fracture. There are no aggressive appearing lytic or blastic lesions noted in the visualized portions of the skeleton. IMPRESSION: 1. Nondisplaced mildly-comminuted fracture of the posterolateral right ninth rib. 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 No pneumothorax. 2. Stable examination demonstrating chronic postradiation changes in the right lung with no definitive evidence to suggest locally recurrent disease or definite metastatic disease in the thorax. 3. Diffuse bronchial wall thickening with moderate centrilobular and paraseptal emphysema; imaging findings suggestive of underlying COPD. 4. Aortic atherosclerosis, in addition to left main and left anterior descending coronary artery disease. Please note that although the presence of coronary artery calcium documents the presence of coronary artery disease, the severity of this disease and any potential stenosis cannot be assessed on this non-gated CT examination. Assessment for potential risk factor modification, dietary therapy or pharmacologic therapy may be warranted, if clinically indicated. 5. There are calcifications of the aortic valve. Echocardiographic correlation for evaluation of potential valvular dysfunction may be warranted if clinically indicated. These results will be called to the ordering clinician or representative by the Radiologist Assistant, and communication documented in the PACS or [employer]. Aortic Atherosclerosis (ICD10-I70.0) and Emphysema (ICD10-J43.9). Electronically Signed   By: Quillian Quince  Entrikin M.D.   On: 09/22/2022 08:59

## 2022-09-23 NOTE — Assessment & Plan Note (Signed)
Stable.  Continue oxygen supplementation.

## 2022-09-23 NOTE — Assessment & Plan Note (Signed)
Pain control, tramadol 50 mg every 12 hours as needed as needed. Recommend patient to take calcium and vitamin D supplementation.

## 2022-09-23 NOTE — Assessment & Plan Note (Signed)
Hemoglobin is stable.  Check iron panel at the next visit.

## 2022-09-23 NOTE — Progress Notes (Signed)
Radiation Oncology Follow up Note  Name: Angela Jensen   Date:   09/23/2022 MRN:  [id number] DOB: [birth year]    This 67 y.o. female presents to the clinic today for 2-year follow-up status post SBRT to her right lower lobe for multinodular adenocarcinoma and patient previously treated to her right upper lobe with SBRT REFERRING PROVIDER: Jonetta Osgood, NP  HPI: Patient is a 67 year old female now out 2 years status post SBRT multiple times to her right lung.  Seen today in routine follow-up she is doing well she is on nasal oxygen she specifically [place] cough hemoptysis or chest tightness or any change in pulmonary status.  She had a recent CT scan.  Showing stable examination with chronic postradiation changes in the right lung with no definitive evidence to suggest local recurrent disease.  She does have a nondisplaced mild leak comminuted fracture of the posterior right ninth rib although she sustained this over 2 years prior.  COMPLICATIONS OF TREATMENT: none  FOLLOW UP COMPLIANCE: keeps appointments   PHYSICAL EXAM:  There were no vitals taken for this visit. Well-developed well-nourished patient in NAD. HEENT reveals PERLA, EOMI, discs not visualized.  Oral cavity is clear. No oral mucosal lesions are identified. Neck is clear without evidence of cervical or supraclavicular adenopathy. Lungs are clear to A&P. Cardiac examination is essentially unremarkable with regular rate and rhythm without murmur rub or thrill. Abdomen is benign with no organomegaly or masses noted. Motor sensory and DTR levels are equal and symmetric in the upper and lower extremities. Cranial nerves II through XII are grossly intact. Proprioception is intact. No peripheral adenopathy or edema is identified. No motor or sensory levels are noted. Crude visual fields are within normal range.  RADIOLOGY RESULTS: CT scans reviewed compatible with above-stated findings  PLAN: Present time patient is doing well with stable  chest by CT criteria after multiple courses of SBRT to her right lung.  On pleased with her overall progress.  Of asked to see her back in 1 year with a repeat CT scan at that time.  Patient is to call with any concerns.  I would like to take this opportunity to thank you for allowing me to participate in the care of your patient.Noreene Filbert, MD

## 2022-09-23 NOTE — Assessment & Plan Note (Addendum)
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 -Recurrence-SBRT completed 06/11/2020.NGS showed TMB >10 muts/mb, MS stable, KRAS G12V, MYC amplification,STK11 B151VO*16, FUBP1 splice sit 073+7T>G February 2024 CT scan results reviewed and discussed with patient No definite evidence of cancer recurrence.  Recommend continued surveillance CT scan in 6 months.

## 2022-09-23 NOTE — Progress Notes (Signed)
Patient here for follow up. No new concerns voiced.  °

## 2022-09-24 LAB — LIPID PANEL
Chol/HDL Ratio: 5.4 ratio — ABNORMAL HIGH (ref 0.0–4.4)
Cholesterol, Total: 247 mg/dL — ABNORMAL HIGH (ref 100–199)
HDL: 46 mg/dL (ref >39)
LDL Chol Calc (NIH): 152 mg/dL — ABNORMAL HIGH (ref 0–99)
Triglycerides: 269 mg/dL — ABNORMAL HIGH (ref 0–149)
VLDL Cholesterol Cal: 49 mg/dL — ABNORMAL HIGH (ref 5–40)

## 2022-09-28 ENCOUNTER — Other Ambulatory Visit: Payer: Self-pay | Admitting: Nurse Practitioner

## 2022-09-28 DIAGNOSIS — Z760 Encounter for issue of repeat prescription: Secondary | ICD-10-CM

## 2022-09-29 NOTE — Telephone Encounter (Signed)
Next 2/24

## 2022-09-30 LAB — POCT I-STAT CREATININE: Creatinine, Ser: 0.8 mg/dL (ref 0.44–1.00)

## 2022-10-01 ENCOUNTER — Ambulatory Visit
Admission: RE | Admit: 2022-10-01 | Discharge: 2022-10-01 | Disposition: A | Discharge status: Home / Self Care | Payer: Medicare Other | Source: Ambulatory Visit | Attending: Nurse Practitioner | Admitting: Nurse Practitioner

## 2022-10-01 DIAGNOSIS — Z1231 Encounter for screening mammogram for malignant neoplasm of breast: Secondary | ICD-10-CM | POA: Diagnosis not present

## 2022-10-05 ENCOUNTER — Other Ambulatory Visit: Payer: Self-pay | Admitting: Internal Medicine

## 2022-10-05 DIAGNOSIS — J449 Chronic obstructive pulmonary disease, unspecified: Secondary | ICD-10-CM

## 2022-10-08 ENCOUNTER — Ambulatory Visit (INDEPENDENT_AMBULATORY_CARE_PROVIDER_SITE_OTHER): Payer: Medicare Other | Admitting: Nurse Practitioner

## 2022-10-08 ENCOUNTER — Encounter: Payer: Self-pay | Admitting: Nurse Practitioner

## 2022-10-08 ENCOUNTER — Telehealth: Payer: Self-pay | Admitting: Nurse Practitioner

## 2022-10-08 VITALS — BP 129/70 | HR 99 | Temp 97.4°F | Resp 16 | Ht 59.0 in | Wt 126.8 lb

## 2022-10-08 DIAGNOSIS — G8929 Other chronic pain: Secondary | ICD-10-CM

## 2022-10-08 DIAGNOSIS — I10 Essential (primary) hypertension: Secondary | ICD-10-CM

## 2022-10-08 DIAGNOSIS — G720 Drug-induced myopathy: Secondary | ICD-10-CM | POA: Diagnosis not present

## 2022-10-08 DIAGNOSIS — E1165 Type 2 diabetes mellitus with hyperglycemia: Secondary | ICD-10-CM

## 2022-10-08 DIAGNOSIS — M79644 Pain in right finger(s): Secondary | ICD-10-CM

## 2022-10-08 DIAGNOSIS — Z789 Other specified health status: Secondary | ICD-10-CM

## 2022-10-08 DIAGNOSIS — T466X5A Adverse effect of antihyperlipidemic and antiarteriosclerotic drugs, initial encounter: Secondary | ICD-10-CM

## 2022-10-08 MED ORDER — LEVEMIR FLEXTOUCH 100 UNIT/ML ~~LOC~~ SOPN: 15.0000 [IU] | PEN_INJECTOR | Freq: Every day | SUBCUTANEOUS | 11 refills | Status: DC

## 2022-10-08 NOTE — Telephone Encounter (Signed)
Awaiting 10/08/22 office notes for Orthopedic referral-[facility]

## 2022-10-08 NOTE — Progress Notes (Signed)
[facility] [place], [place] [address]  Internal MEDICINE  Office Visit Note  Patient Name: Angela Jensen  [id number]  [id number]  Date of Service: 10/08/2022  Chief Complaint  Patient presents with   Follow-up    HPI Angela Jensen presents for a follow-up visit for order for oxygen, diabetes, and thumb pain [employer] for oxygen and inogen  Room air o2 sat 87: at rest, 84% when walking across exam room and back.  Improved to 92% on 3 LPM  Glucose levels fluctuating, current levemir dose is 12 units daily.  Having chronic pain in right thumb that started several months ago, wants to see orthopedic specialist.  Is diabetic and should be on a statin but has had significant myopathy when taking statin medications in the past.     Current Medication: Outpatient Encounter Medications as of 10/08/2022  Medication Sig   Accu-Chek Softclix Lancets lancets Use as instructed for twice a daily Dx E11.65   albuterol (PROVENTIL) (2.5 MG/3ML) 0.083% nebulizer solution USE 1 VIAL IN NEBULIZER EVERY 6 HOURS AS NEEDED FOR WHEEZING   albuterol (VENTOLIN HFA) 108 (90 Base) MCG/ACT inhaler INHALE 2 PUFFS BY MOUTH EVERY 6 HOURS AS NEEDED FOR WHEEZING OR SHORTNESS OF BREATH   ALPRAZolam (XANAX) 0.25 MG tablet TAKE 1 TABLET BY MOUTH THREE TIMES DAILY AS NEEDED FOR ANXIETY   Blood Glucose Monitoring Suppl (ACCU-CHEK GUIDE) w/Device KIT Use as directed Dx e11.65   clopidogrel (PLAVIX) 75 MG tablet Take 1 tablet by mouth once daily   Continuous Blood Gluc Receiver (DEXCOM G7 RECEIVER) DEVI Use one as directed for uncontrolled dm   Continuous Blood Gluc Sensor (DEXCOM G7 SENSOR) MISC Apply 1 sensor to skin on designated areas every 10 days to monitor glucose levels with sensor or smart phone app   cyclobenzaprine (FLEXERIL) 10 MG tablet Take 1 tablet (10 mg total) by mouth at bedtime. Take one tab po qhs for back spasm prn only   diclofenac (VOLTAREN) 75 MG EC tablet Take 1 tablet (75 mg total)  by mouth 2 (two) times daily.   doxycycline (VIBRA-TABS) 100 MG tablet Take 1 tablet (100 mg total) by mouth 2 (two) times daily.   escitalopram (LEXAPRO) 10 MG tablet Take 1 tablet (10 mg total) by mouth daily.   ferrous sulfate 325 (65 FE) MG tablet Take 325 mg by mouth 2 (two) times daily with a meal.   fluticasone (FLONASE) 50 MCG/ACT nasal spray Place 2 sprays into both nostrils daily.   furosemide (LASIX) 20 MG tablet Take 1 tablet (20 mg total) by mouth daily as needed.   gabapentin (NEURONTIN) 300 MG capsule Take 1 capsule (300 mg total) by mouth 3 (three) times daily.   glucose blood (ACCU-CHEK GUIDE) test strip USE  STRIP TO CHECK GLUCOSE first thing in the morning, before meals and at bedtime (check glucose level a total of 5 times daily).   lisinopril-hydrochlorothiazide (ZESTORETIC) 20-25 MG tablet Take 1 tablet by mouth daily.   metFORMIN (GLUCOPHAGE) 500 MG tablet TAKE 2 TABLETS BY MOUTH ONCE DAILY WITH BREAKFAST   metoprolol tartrate (LOPRESSOR) 25 MG tablet Take 1/2 (one-half) tablet by mouth twice daily   mometasone-formoterol (DULERA) 200-5 MCG/ACT AERO Inhale 2 puffs into the lungs 2 (two) times daily.   Omega-3 Fatty Acids (FISH OIL) 1000 MG CAPS Take 1 capsule by mouth daily.   OXYGEN Inhale 4 L into the lungs. PT USES [facility] FOR OXYGEN   potassium chloride (KLOR-CON) 10 MEQ tablet  TAKE 1 TABLET BY MOUTH EVERY OTHER DAY   RELION PEN NEEDLES 31G X 6 MM MISC USE WITH INSULIN PEN TO INJECT INSULIN ONCE DAILY AS DIRECTED   SPIRIVA HANDIHALER 18 MCG inhalation capsule INHALE THE CONTENTS OF ONE CAPSULE BY MOUTH ONCE DAILY. DO NOT SWALLOW CAPSULES.   traMADol (ULTRAM) 50 MG tablet Take 1 tablet (50 mg total) by mouth every 12 (twelve) hours as needed.   [DISCONTINUED] insulin detemir (LEVEMIR FLEXTOUCH) 100 UNIT/ML FlexPen Inject 12 Units into the skin daily with supper. Inject 8- 12 units with supper qd// with needles   famotidine (PEPCID) 20 MG tablet Take 1 tablet (20 mg  total) by mouth daily for 14 days.   insulin detemir (LEVEMIR FLEXTOUCH) 100 UNIT/ML FlexPen Inject 15 Units into the skin daily with supper.   No facility-administered encounter medications on file as of 10/08/2022.    Surgical History: Past Surgical History:  Procedure Laterality Date   CESAREAN SECTION     x3   COLONOSCOPY WITH PROPOFOL N/A 06/25/2015   Procedure: COLONOSCOPY WITH PROPOFOL;  Surgeon: Lucilla Lame, MD;  Location: [facility];  Service: Endoscopy;  Laterality: N/A;   COLONOSCOPY WITH PROPOFOL N/A 07/26/2020   Procedure: COLONOSCOPY WITH PROPOFOL;  Surgeon: Lucilla Lame, MD;  Location: [place];  Service: Endoscopy;  Laterality: N/A;   CYST REMOVAL LEG     and on shoulder    ESOPHAGOGASTRODUODENOSCOPY (EGD) WITH PROPOFOL N/A 07/26/2020   Procedure: ESOPHAGOGASTRODUODENOSCOPY (EGD) WITH PROPOFOL;  Surgeon: Lucilla Lame, MD;  Location: [place];  Service: Endoscopy;  Laterality: N/A;  Diabetic - oral meds   LOWER EXTREMITY ANGIOGRAPHY Left 09/29/2018   Procedure: LOWER EXTREMITY ANGIOGRAPHY;  Surgeon: Algernon Huxley, MD;  Location: [place] [facility];  Service: Cardiovascular;  Laterality: Left;   LUNG BIOPSY  12/30/2011   has lung "spots"   PACEMAKER IMPLANT  07/14/2021   PACEMAKER LEADLESS INSERTION N/A 07/14/2021   Procedure: PACEMAKER LEADLESS INSERTION;  Surgeon: Isaias Cowman, MD;  Location: [place] [facility];  Service: Cardiovascular;  Laterality: N/A;   PERIPHERAL VASCULAR CATHETERIZATION Left 06/01/2016   Procedure: Lower Extremity Angiography;  Surgeon: Algernon Huxley, MD;  Location: [place] [facility];  Service: Cardiovascular;  Laterality: Left;   PERIPHERAL VASCULAR CATHETERIZATION N/A 06/01/2016   Procedure: Abdominal Aortogram w/Lower Extremity;  Surgeon: Algernon Huxley, MD;  Location: [place] [facility];  Service: Cardiovascular;  Laterality: N/A;   PERIPHERAL VASCULAR CATHETERIZATION  06/01/2016   Procedure: Lower Extremity  Intervention;  Surgeon: Algernon Huxley, MD;  Location: [place] [facility];  Service: Cardiovascular;;   PERIPHERAL VASCULAR CATHETERIZATION Right 06/08/2016   Procedure: Lower Extremity Angiography;  Surgeon: Algernon Huxley, MD;  Location: [place] [facility];  Service: Cardiovascular;  Laterality: Right;   PERIPHERAL VASCULAR CATHETERIZATION  06/08/2016   Procedure: Lower Extremity Intervention;  Surgeon: Algernon Huxley, MD;  Location: [place] [facility];  Service: Cardiovascular;;   SUBMANDIBULAR GLAND EXCISION Left 12/06/2020   Procedure: EXCISION SUBMANDIBULAR GLAND;  Surgeon: Beverly Gust, MD;  Location: [place];  Service: ENT;  Laterality: Left;  needs to be first case Diabetic - diet controlled   TEMPORARY PACEMAKER N/A 07/11/2021   Procedure: TEMPORARY PACEMAKER;  Surgeon: Isaias Cowman, MD;  Location: [place] [facility];  Service: Cardiovascular;  Laterality: N/A;    Medical History: Past Medical History:  Diagnosis Date   Anemia    Arthritis    Asthma    Atherosclerosis of native  arteries of extremity with intermittent claudication ([place]) 05/26/2016   Cancer ([place]) 2012   Right Lung CA   COPD (chronic obstructive pulmonary disease) (HCC)    Depression    Diabetes mellitus without complication ([place])    Patient takes Janumet   Essential hypertension 05/26/2016   Heart failure ([place]) 2022   Hypercholesteremia    Oxygen dependent    2L at nite    PAD (peripheral artery disease) ([place]) 06/22/2016   Peripheral vascular disease (HCC)    Personal history of radiation therapy    Shortness of breath dyspnea    with exertion    Sialolithiasis    Sleep apnea    Wears dentures    full upper and lower    Family History: Family History  Problem Relation Age of Onset   Diabetes Mother    Hypercholesterolemia Mother    Lung cancer Father    Diabetes Sister    Diabetes Sister    Hypertension Sister    Diabetes Maternal Grandmother    Diabetes Paternal  Grandmother    Heart attack Brother    Coronary artery disease Brother    Vascular Disease Brother    Heart attack Brother    Breast cancer Neg Hx     Social History   Socioeconomic History   Marital status: Widowed    Spouse name: Not on file   Number of children: Not on file   Years of education: Not on file   Highest education level: Not on file  Occupational History   Not on file  Tobacco Use   Smoking status: Former    Packs/day: 1.00    Years: 37.00    Total pack years: 37.00    Types: Cigarettes    Quit date: 02/06/2010    Years since quitting: 12.6   Smokeless tobacco: Former    Types: Snuff  Vaping Use   Vaping Use: Never used  Substance and Sexual Activity   Alcohol use: Not Currently    Alcohol/week: 5.0 standard drinks of alcohol    Types: 5 Cans of beer per week    Comment: /h x of alcohol abuse -stopped 2012- now drinks 5 beer per week   Drug use: Not Currently    Types: Marijuana, "Crack" cocaine, Cocaine    Comment: hx of cocaine use- last use 2015; last use marijuana6/22/19,   Sexual activity: Yes  Other Topics Concern   Not on file  Social History Narrative   Lives with Significant Other x 43 years   Social Determinants of Health   Financial Resource Strain: Not on file  Food Insecurity: Not on file  Transportation Needs: Not on file  Physical Activity: Not on file  Stress: Not on file  Social Connections: Not on file  Intimate Partner Violence: Not on file      Review of Systems  Constitutional:  Negative for chills, fatigue and unexpected weight change.  HENT:  Negative for congestion, rhinorrhea, sneezing and sore throat.   Eyes:  Negative for redness.  Respiratory:  Negative for cough, chest tightness and shortness of breath.   Cardiovascular:  Negative for chest pain and palpitations.  Gastrointestinal:  Positive for abdominal pain. Negative for constipation, diarrhea, nausea and vomiting.  Genitourinary:  Negative for dysuria and  frequency.  Musculoskeletal:  Positive for arthralgias (chronic iright thumb pain) and back pain. Negative for joint swelling and neck pain.  Skin:  Negative for rash.  Neurological: Negative.  Negative for tremors and numbness.  Hematological:  Negative for adenopathy. Does not bruise/bleed easily.  Psychiatric/Behavioral:  Negative for behavioral problems (Depression), sleep disturbance and suicidal ideas. The patient is not nervous/anxious.     Vital Signs: BP 129/70   Pulse 99   Temp (!) 97.4 F (36.3 C)   Resp 16   Ht '4\' 11"'$  (1.499 m)   Wt 126 lb 12.8 oz (57.5 kg)   SpO2 92% Comment: 3L  BMI 25.61 kg/m    Physical Exam Vitals reviewed.  Constitutional:      General: She is not in acute distress.    Appearance: Normal appearance. She is normal weight. She is not ill-appearing.  HENT:     Head: Normocephalic and atraumatic.  Eyes:     Pupils: Pupils are equal, round, and reactive to light.  Cardiovascular:     Rate and Rhythm: Normal rate and regular rhythm.  Pulmonary:     Effort: Pulmonary effort is normal. No respiratory distress.  Musculoskeletal:     Right hand: Decreased range of motion (right thumb). Decreased strength of thumb/finger opposition.  Lymphadenopathy:     Comments: Lymphadenopathy ruq and right flank  Neurological:     Mental Status: She is alert and oriented to person, place, and time.  Psychiatric:        Mood and Affect: Mood normal.        Behavior: Behavior normal.        Assessment/Plan: 1. Uncontrolled type 2 diabetes mellitus with hyperglycemia (HCC) Levemir dose increased to 15 units daily. Follow up in 2 months for A1c recheck.  - insulin detemir (LEVEMIR FLEXTOUCH) 100 UNIT/ML FlexPen; Inject 15 Units into the skin daily with supper.  Dispense: 6 mL; Refill: 11  2. Essential hypertension Stable, continue metoprolol as prescribed   3. Chronic pain of right thumb Referred to ortho - Ambulatory referral to Orthopedic  Surgery  4. Statin myopathy Does not tolerate statins   General Counseling: Fleeta verbalizes understanding of the findings of todays visit and agrees with plan of treatment. I have discussed any further diagnostic evaluation that may be needed or ordered today. We also reviewed her medications today. she has been encouraged to call the office with any questions or concerns that should arise related to todays visit.    Orders Placed This Encounter  Procedures   Ambulatory referral to Orthopedic Surgery    Meds ordered this encounter  Medications   insulin detemir (LEVEMIR FLEXTOUCH) 100 UNIT/ML FlexPen    Sig: Inject 15 Units into the skin daily with supper.    Dispense:  6 mL    Refill:  11    For future refills    Return in about 2 months (around 12/07/2022) for F/U, Recheck A1C, Endrit Gittins PCP.   Total time spent:30 Minutes Time spent includes review of chart, medications, test results, and follow up plan with the patient.   [place] Controlled Substance Database was reviewed by me.  This patient was seen by Jonetta Osgood, FNP-C in collaboration with Dr. Clayborn Bigness as a part of collaborative care agreement.   Brailon Don R. Valetta Fuller, MSN, FNP-C Internal medicine

## 2022-10-13 ENCOUNTER — Telehealth: Payer: Self-pay

## 2022-10-13 NOTE — Telephone Encounter (Signed)
Faxed inogen for re certification

## 2022-10-17 DIAGNOSIS — J449 Chronic obstructive pulmonary disease, unspecified: Secondary | ICD-10-CM | POA: Diagnosis not present

## 2022-10-19 ENCOUNTER — Other Ambulatory Visit: Payer: Self-pay | Admitting: Nurse Practitioner

## 2022-10-19 DIAGNOSIS — Z760 Encounter for issue of repeat prescription: Secondary | ICD-10-CM

## 2022-10-19 NOTE — Telephone Encounter (Signed)
Please review

## 2022-10-19 NOTE — Telephone Encounter (Signed)
Med sent.

## 2022-10-22 ENCOUNTER — Other Ambulatory Visit: Payer: Self-pay | Admitting: Nurse Practitioner

## 2022-10-22 DIAGNOSIS — Z760 Encounter for issue of repeat prescription: Secondary | ICD-10-CM

## 2022-10-24 ENCOUNTER — Encounter: Payer: Self-pay | Admitting: Nurse Practitioner

## 2022-10-26 ENCOUNTER — Other Ambulatory Visit: Payer: Self-pay | Admitting: Nurse Practitioner

## 2022-10-27 ENCOUNTER — Telehealth: Payer: Self-pay | Admitting: Nurse Practitioner

## 2022-10-27 NOTE — Telephone Encounter (Signed)
Orthopedic referral sent via [employer] to Dr. Rudene Christians with [facility]

## 2022-10-28 ENCOUNTER — Telehealth: Payer: Self-pay

## 2022-10-28 NOTE — Telephone Encounter (Signed)
Faxed inogen with alyssa signature

## 2022-10-29 ENCOUNTER — Telehealth: Payer: Self-pay | Admitting: Nurse Practitioner

## 2022-10-29 NOTE — Telephone Encounter (Signed)
Orthopedic appointment>> 11/03/22 with [facility]

## 2022-11-03 DIAGNOSIS — M65311 Trigger thumb, right thumb: Secondary | ICD-10-CM | POA: Diagnosis not present

## 2022-11-03 DIAGNOSIS — M65312 Trigger thumb, left thumb: Secondary | ICD-10-CM | POA: Diagnosis not present

## 2022-11-04 ENCOUNTER — Other Ambulatory Visit: Payer: Self-pay | Admitting: Nurse Practitioner

## 2022-11-04 DIAGNOSIS — I10 Essential (primary) hypertension: Secondary | ICD-10-CM | POA: Diagnosis not present

## 2022-11-04 DIAGNOSIS — Z760 Encounter for issue of repeat prescription: Secondary | ICD-10-CM

## 2022-11-04 DIAGNOSIS — R0602 Shortness of breath: Secondary | ICD-10-CM | POA: Diagnosis not present

## 2022-11-04 DIAGNOSIS — J449 Chronic obstructive pulmonary disease, unspecified: Secondary | ICD-10-CM | POA: Diagnosis not present

## 2022-11-06 ENCOUNTER — Other Ambulatory Visit: Payer: Self-pay | Admitting: Nurse Practitioner

## 2022-11-06 DIAGNOSIS — Z760 Encounter for issue of repeat prescription: Secondary | ICD-10-CM

## 2022-11-16 ENCOUNTER — Other Ambulatory Visit: Payer: Self-pay | Admitting: Nurse Practitioner

## 2022-11-16 DIAGNOSIS — E1165 Type 2 diabetes mellitus with hyperglycemia: Secondary | ICD-10-CM

## 2022-11-18 ENCOUNTER — Emergency Department: Payer: Medicare Other

## 2022-11-18 ENCOUNTER — Inpatient Hospital Stay
Admission: EM | Admit: 2022-11-18 | Discharge: 2022-11-20 | DRG: 191 | Disposition: A | Discharge status: Home / Self Care | Payer: Medicare Other | Attending: Internal Medicine | Admitting: Internal Medicine

## 2022-11-18 ENCOUNTER — Other Ambulatory Visit: Payer: Self-pay

## 2022-11-18 DIAGNOSIS — I70209 Unspecified atherosclerosis of native arteries of extremities, unspecified extremity: Secondary | ICD-10-CM | POA: Diagnosis not present

## 2022-11-18 DIAGNOSIS — I110 Hypertensive heart disease with heart failure: Secondary | ICD-10-CM | POA: Diagnosis not present

## 2022-11-18 DIAGNOSIS — Z85118 Personal history of other malignant neoplasm of bronchus and lung: Secondary | ICD-10-CM

## 2022-11-18 DIAGNOSIS — I10 Essential (primary) hypertension: Secondary | ICD-10-CM

## 2022-11-18 DIAGNOSIS — T380X5A Adverse effect of glucocorticoids and synthetic analogues, initial encounter: Secondary | ICD-10-CM | POA: Diagnosis present

## 2022-11-18 DIAGNOSIS — J9611 Chronic respiratory failure with hypoxia: Secondary | ICD-10-CM | POA: Diagnosis present

## 2022-11-18 DIAGNOSIS — D649 Anemia, unspecified: Secondary | ICD-10-CM | POA: Diagnosis present

## 2022-11-18 DIAGNOSIS — E1142 Type 2 diabetes mellitus with diabetic polyneuropathy: Secondary | ICD-10-CM

## 2022-11-18 DIAGNOSIS — M1990 Unspecified osteoarthritis, unspecified site: Secondary | ICD-10-CM | POA: Diagnosis not present

## 2022-11-18 DIAGNOSIS — J441 Chronic obstructive pulmonary disease with (acute) exacerbation: Secondary | ICD-10-CM | POA: Diagnosis not present

## 2022-11-18 DIAGNOSIS — K219 Gastro-esophageal reflux disease without esophagitis: Secondary | ICD-10-CM | POA: Insufficient documentation

## 2022-11-18 DIAGNOSIS — Z801 Family history of malignant neoplasm of trachea, bronchus and lung: Secondary | ICD-10-CM

## 2022-11-18 DIAGNOSIS — Z833 Family history of diabetes mellitus: Secondary | ICD-10-CM

## 2022-11-18 DIAGNOSIS — Z7984 Long term (current) use of oral hypoglycemic drugs: Secondary | ICD-10-CM | POA: Diagnosis not present

## 2022-11-18 DIAGNOSIS — R0600 Dyspnea, unspecified: Secondary | ICD-10-CM | POA: Diagnosis not present

## 2022-11-18 DIAGNOSIS — Z87891 Personal history of nicotine dependence: Secondary | ICD-10-CM | POA: Diagnosis not present

## 2022-11-18 DIAGNOSIS — F419 Anxiety disorder, unspecified: Secondary | ICD-10-CM | POA: Diagnosis present

## 2022-11-18 DIAGNOSIS — Z9981 Dependence on supplemental oxygen: Secondary | ICD-10-CM

## 2022-11-18 DIAGNOSIS — Z923 Personal history of irradiation: Secondary | ICD-10-CM | POA: Diagnosis not present

## 2022-11-18 DIAGNOSIS — Z8249 Family history of ischemic heart disease and other diseases of the circulatory system: Secondary | ICD-10-CM

## 2022-11-18 DIAGNOSIS — Z7951 Long term (current) use of inhaled steroids: Secondary | ICD-10-CM

## 2022-11-18 DIAGNOSIS — J449 Chronic obstructive pulmonary disease, unspecified: Secondary | ICD-10-CM | POA: Diagnosis not present

## 2022-11-18 DIAGNOSIS — I5032 Chronic diastolic (congestive) heart failure: Secondary | ICD-10-CM | POA: Diagnosis not present

## 2022-11-18 DIAGNOSIS — F32A Depression, unspecified: Secondary | ICD-10-CM | POA: Insufficient documentation

## 2022-11-18 DIAGNOSIS — Z7902 Long term (current) use of antithrombotics/antiplatelets: Secondary | ICD-10-CM

## 2022-11-18 DIAGNOSIS — J439 Emphysema, unspecified: Secondary | ICD-10-CM | POA: Diagnosis not present

## 2022-11-18 DIAGNOSIS — G4733 Obstructive sleep apnea (adult) (pediatric): Secondary | ICD-10-CM | POA: Diagnosis present

## 2022-11-18 DIAGNOSIS — R0602 Shortness of breath: Secondary | ICD-10-CM | POA: Diagnosis not present

## 2022-11-18 DIAGNOSIS — J9621 Acute and chronic respiratory failure with hypoxia: Secondary | ICD-10-CM

## 2022-11-18 DIAGNOSIS — Z79899 Other long term (current) drug therapy: Secondary | ICD-10-CM

## 2022-11-18 DIAGNOSIS — Z794 Long term (current) use of insulin: Secondary | ICD-10-CM

## 2022-11-18 DIAGNOSIS — E7800 Pure hypercholesterolemia, unspecified: Secondary | ICD-10-CM | POA: Diagnosis present

## 2022-11-18 DIAGNOSIS — E1165 Type 2 diabetes mellitus with hyperglycemia: Secondary | ICD-10-CM | POA: Diagnosis not present

## 2022-11-18 DIAGNOSIS — F1010 Alcohol abuse, uncomplicated: Secondary | ICD-10-CM | POA: Diagnosis present

## 2022-11-18 DIAGNOSIS — Z83438 Family history of other disorder of lipoprotein metabolism and other lipidemia: Secondary | ICD-10-CM

## 2022-11-18 DIAGNOSIS — E1151 Type 2 diabetes mellitus with diabetic peripheral angiopathy without gangrene: Secondary | ICD-10-CM | POA: Diagnosis present

## 2022-11-18 DIAGNOSIS — I4510 Unspecified right bundle-branch block: Secondary | ICD-10-CM | POA: Diagnosis present

## 2022-11-18 DIAGNOSIS — Z950 Presence of cardiac pacemaker: Secondary | ICD-10-CM

## 2022-11-18 DIAGNOSIS — R0902 Hypoxemia: Secondary | ICD-10-CM | POA: Diagnosis not present

## 2022-11-18 LAB — BLOOD GAS, VENOUS
Acid-Base Excess: 6.9 mmol/L — ABNORMAL HIGH (ref 0.0–2.0)
Bicarbonate: 34.1 mmol/L — ABNORMAL HIGH (ref 20.0–28.0)
O2 Saturation: 57.6 %
Patient temperature: 37
pCO2, Ven: 59 mmHg (ref 44–60)
pH, Ven: 7.37 (ref 7.25–7.43)
pO2, Ven: 41 mmHg (ref 32–45)

## 2022-11-18 LAB — BASIC METABOLIC PANEL
Anion gap: 10 (ref 5–15)
BUN: 16 mg/dL (ref 8–23)
CO2: 29 mmol/L (ref 22–32)
Calcium: 9.4 mg/dL (ref 8.9–10.3)
Chloride: 101 mmol/L (ref 98–111)
Creatinine, Ser: 0.7 mg/dL (ref 0.44–1.00)
GFR, Estimated: >60 mL/min (ref >60)
Glucose, Bld: 132 mg/dL — ABNORMAL HIGH (ref 70–99)
Potassium: 3.5 mmol/L (ref 3.5–5.1)
Sodium: 140 mmol/L (ref 135–145)

## 2022-11-18 LAB — CBC
HCT: 35.6 % — ABNORMAL LOW (ref 36.0–46.0)
Hemoglobin: 10.5 g/dL — ABNORMAL LOW (ref 12.0–15.0)
MCH: 28.5 pg (ref 26.0–34.0)
MCHC: 29.5 g/dL — ABNORMAL LOW (ref 30.0–36.0)
MCV: 96.5 fL (ref 80.0–100.0)
Platelets: 256 10*3/uL (ref 150–400)
RBC: 3.69 MIL/uL — ABNORMAL LOW (ref 3.87–5.11)
RDW: 14.8 % (ref 11.5–15.5)
WBC: 7.9 10*3/uL (ref 4.0–10.5)
nRBC: 0 % (ref 0.0–0.2)

## 2022-11-18 LAB — LACTIC ACID, PLASMA: Lactic Acid, Venous: 2.4 mmol/L (ref 0.5–1.9)

## 2022-11-18 LAB — TROPONIN I (HIGH SENSITIVITY): Troponin I (High Sensitivity): 9 ng/L (ref <18)

## 2022-11-18 MED ORDER — PREDNISONE 20 MG PO TABS
40.0000 mg | ORAL_TABLET | Freq: Every day | ORAL | Status: DC
  Administered 2022-11-20: 40 mg via ORAL
  Filled 2022-11-18: qty 2

## 2022-11-18 MED ORDER — TRAMADOL HCL 50 MG PO TABS
50.0000 mg | ORAL_TABLET | Freq: Two times a day (BID) | ORAL | Status: DC | PRN
  Administered 2022-11-19: 50 mg via ORAL
  Filled 2022-11-18: qty 1

## 2022-11-18 MED ORDER — SODIUM CHLORIDE 0.9 % IV SOLN: 1.0000 g | INTRAVENOUS | Status: DC

## 2022-11-18 MED ORDER — CLOPIDOGREL BISULFATE 75 MG PO TABS
75.0000 mg | ORAL_TABLET | Freq: Every day | ORAL | Status: DC
  Administered 2022-11-19 – 2022-11-20 (×2): 75 mg via ORAL
  Filled 2022-11-18 (×2): qty 1

## 2022-11-18 MED ORDER — METHYLPREDNISOLONE SODIUM SUCC 125 MG IJ SOLR
125.0000 mg | Freq: Once | INTRAMUSCULAR | Status: AC
  Administered 2022-11-18: 125 mg via INTRAVENOUS
  Filled 2022-11-18: qty 2

## 2022-11-18 MED ORDER — LISINOPRIL-HYDROCHLOROTHIAZIDE 20-25 MG PO TABS: 1.0000 | ORAL_TABLET | Freq: Every day | ORAL | Status: DC

## 2022-11-18 MED ORDER — OMEGA-3-ACID ETHYL ESTERS 1 G PO CAPS
1.0000 g | ORAL_CAPSULE | Freq: Every day | ORAL | Status: DC
  Administered 2022-11-19 – 2022-11-20 (×2): 1 g via ORAL
  Filled 2022-11-18 (×2): qty 1

## 2022-11-18 MED ORDER — ESCITALOPRAM OXALATE 10 MG PO TABS
10.0000 mg | ORAL_TABLET | Freq: Every day | ORAL | Status: DC
  Administered 2022-11-19 – 2022-11-20 (×2): 10 mg via ORAL
  Filled 2022-11-18 (×2): qty 1

## 2022-11-18 MED ORDER — ONDANSETRON HCL 4 MG/2ML IJ SOLN: 4.0000 mg | Freq: Four times a day (QID) | INTRAMUSCULAR | Status: DC | PRN

## 2022-11-18 MED ORDER — IPRATROPIUM-ALBUTEROL 0.5-2.5 (3) MG/3ML IN SOLN
9.0000 mL | Freq: Once | RESPIRATORY_TRACT | Status: AC
  Administered 2022-11-18: 9 mL via RESPIRATORY_TRACT
  Filled 2022-11-18 (×2): qty 3

## 2022-11-18 MED ORDER — ALPRAZOLAM 0.5 MG PO TABS
0.2500 mg | ORAL_TABLET | Freq: Three times a day (TID) | ORAL | Status: DC | PRN
  Administered 2022-11-19 (×2): 0.25 mg via ORAL
  Filled 2022-11-18 (×3): qty 1

## 2022-11-18 MED ORDER — FUROSEMIDE 40 MG PO TABS: 20.0000 mg | ORAL_TABLET | Freq: Every day | ORAL | Status: DC | PRN

## 2022-11-18 MED ORDER — METOPROLOL TARTRATE 25 MG PO TABS
25.0000 mg | ORAL_TABLET | Freq: Two times a day (BID) | ORAL | Status: DC
  Administered 2022-11-19 – 2022-11-20 (×3): 25 mg via ORAL
  Filled 2022-11-18 (×3): qty 1

## 2022-11-18 MED ORDER — SODIUM CHLORIDE 0.9 % IV SOLN: INTRAVENOUS | Status: DC

## 2022-11-18 MED ORDER — MAGNESIUM HYDROXIDE 400 MG/5ML PO SUSP: 30.0000 mL | Freq: Every day | ORAL | Status: DC | PRN

## 2022-11-18 MED ORDER — FAMOTIDINE 20 MG PO TABS: 20.0000 mg | ORAL_TABLET | Freq: Every day | ORAL | Status: DC

## 2022-11-18 MED ORDER — ACETAMINOPHEN 650 MG RE SUPP: 650.0000 mg | Freq: Four times a day (QID) | RECTAL | Status: DC | PRN

## 2022-11-18 MED ORDER — TRAZODONE HCL 50 MG PO TABS: 25.0000 mg | ORAL_TABLET | Freq: Every evening | ORAL | Status: DC | PRN

## 2022-11-18 MED ORDER — GABAPENTIN 300 MG PO CAPS
300.0000 mg | ORAL_CAPSULE | Freq: Three times a day (TID) | ORAL | Status: DC
  Administered 2022-11-19 – 2022-11-20 (×4): 300 mg via ORAL
  Filled 2022-11-18 (×4): qty 1

## 2022-11-18 MED ORDER — FLUTICASONE PROPIONATE 50 MCG/ACT NA SUSP: 2.0000 | Freq: Every day | NASAL | Status: DC | PRN

## 2022-11-18 MED ORDER — LABETALOL HCL 5 MG/ML IV SOLN: 20.0000 mg | INTRAVENOUS | Status: DC | PRN

## 2022-11-18 MED ORDER — FERROUS SULFATE 325 (65 FE) MG PO TABS
325.0000 mg | ORAL_TABLET | Freq: Every day | ORAL | Status: DC
  Administered 2022-11-19 – 2022-11-20 (×2): 325 mg via ORAL
  Filled 2022-11-18 (×2): qty 1

## 2022-11-18 MED ORDER — INSULIN DETEMIR 100 UNIT/ML ~~LOC~~ SOLN: 15.0000 [IU] | Freq: Every day | SUBCUTANEOUS | Status: DC

## 2022-11-18 MED ORDER — ONDANSETRON HCL 4 MG PO TABS: 4.0000 mg | ORAL_TABLET | Freq: Four times a day (QID) | ORAL | Status: DC | PRN

## 2022-11-18 MED ORDER — SODIUM CHLORIDE 0.9 % IV SOLN
1.0000 g | Freq: Once | INTRAVENOUS | Status: AC
  Administered 2022-11-18: 1 g via INTRAVENOUS
  Filled 2022-11-18: qty 10

## 2022-11-18 MED ORDER — METHYLPREDNISOLONE SODIUM SUCC 40 MG IJ SOLR
40.0000 mg | Freq: Two times a day (BID) | INTRAMUSCULAR | Status: AC
  Administered 2022-11-19 (×2): 40 mg via INTRAVENOUS
  Filled 2022-11-18 (×2): qty 1

## 2022-11-18 MED ORDER — ACETAMINOPHEN 325 MG PO TABS
650.0000 mg | ORAL_TABLET | Freq: Four times a day (QID) | ORAL | Status: DC | PRN
  Administered 2022-11-19: 650 mg via ORAL
  Filled 2022-11-18: qty 2

## 2022-11-18 MED ORDER — ENOXAPARIN SODIUM 40 MG/0.4ML IJ SOSY
40.0000 mg | PREFILLED_SYRINGE | INTRAMUSCULAR | Status: DC
  Administered 2022-11-19 – 2022-11-20 (×2): 40 mg via SUBCUTANEOUS
  Filled 2022-11-18 (×2): qty 0.4

## 2022-11-18 MED ORDER — IPRATROPIUM-ALBUTEROL 0.5-2.5 (3) MG/3ML IN SOLN
3.0000 mL | Freq: Four times a day (QID) | RESPIRATORY_TRACT | Status: DC
  Administered 2022-11-19: 3 mL via RESPIRATORY_TRACT
  Filled 2022-11-18: qty 3

## 2022-11-18 MED ORDER — CYCLOBENZAPRINE HCL 10 MG PO TABS: 10.0000 mg | ORAL_TABLET | Freq: Every day | ORAL | Status: DC

## 2022-11-18 NOTE — H&P (Signed)
[place]   PATIENT NAME: Angela Jensen    MR#:  [id number]  DATE OF BIRTH:  [birth year]  DATE OF ADMISSION:  11/18/2022  PRIMARY CARE PHYSICIAN: Jonetta Osgood, NP   Patient is coming from: Home  REQUESTING/REFERRING PHYSICIAN: Nathaniel Man, MD  CHIEF COMPLAINT:   Chief Complaint  Patient presents with   Shortness of Breath    HISTORY OF PRESENT ILLNESS:  Angela Jensen is a 67 y.o. African-American female with medical history significant for asthma, COPD, depression, type diabetes mellitus, essential hypertension, CHF, chronic respiratory failure on home O2 at 2 to 3 L/min, PAD and OSA, who presented to the ER with acute onset of worsening dyspnea with associated cough and wheezing.  His cough and wheezing worsened over the last 3 weeks and his dyspnea has been going on over the last couple of days.  She has been trying to expectorate congested cough however he could not.  No fever or chills.  She has been using her nebulizer less frequent than she could at home.  No nausea or vomiting or abdominal pain.  No chest pain or palpitations.  No dysuria, oliguria or hematuria or flank pain.  No bleeding diathesis.  ED Course: When she came to the ER, BP was 176/82 with otherwise normal vital signs. Labs VBG revealed a pH 7.37 with HCO3 of 37.  High sensitive troponin I was 9 and later 8.  Lactic acid was 2.4 and later 1.5.  CBC showed anemia close to baseline.  Blood culture was were drawn.  Portable chest x-ray showed emphysema with no acute airspace disease.  It showed post therapeutic changes in the right lung base.  EKG as reviewed by me : It showed normal sinus rhythm with a rate of 95 with incomplete right bundle branch block and T wave inversion anteroseptally. Imaging: Portable chest ray showed the following: 1. Stable post therapeutic changes at the right lung base. 2. Emphysema. 3. No acute airspace disease.  The patient was given DuoNeb, 125 mg of IV Solu-Medrol and a  gram of IV Rocephin.  Will be admitted to a medical telemetry bed for further evaluation and management.  PAST MEDICAL HISTORY:   Past Medical History:  Diagnosis Date   Anemia    Arthritis    Asthma    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   Cancer 2012   Right Lung CA   COPD (chronic obstructive pulmonary disease)    Depression    Diabetes mellitus without complication    Patient takes Janumet   Essential hypertension 05/26/2016   Heart failure 2022   Hypercholesteremia    Oxygen dependent    2L at nite    PAD (peripheral artery disease) 06/22/2016   Peripheral vascular disease    Personal history of radiation therapy    Shortness of breath dyspnea    with exertion    Sialolithiasis    Sleep apnea    Wears dentures    full upper and lower    PAST SURGICAL HISTORY:   Past Surgical History:  Procedure Laterality Date   CESAREAN SECTION     x3   COLONOSCOPY WITH PROPOFOL N/A 06/25/2015   Procedure: COLONOSCOPY WITH PROPOFOL;  Surgeon: Lucilla Lame, MD;  Location: [facility];  Service: Endoscopy;  Laterality: N/A;   COLONOSCOPY WITH PROPOFOL N/A 07/26/2020   Procedure: COLONOSCOPY WITH PROPOFOL;  Surgeon: Lucilla Lame, MD;  Location: [place];  Service: Endoscopy;  Laterality: N/A;   CYST REMOVAL LEG     and on shoulder    ESOPHAGOGASTRODUODENOSCOPY (EGD) WITH PROPOFOL N/A 07/26/2020   Procedure: ESOPHAGOGASTRODUODENOSCOPY (EGD) WITH PROPOFOL;  Surgeon: Lucilla Lame, MD;  Location: [place];  Service: Endoscopy;  Laterality: N/A;  Diabetic - oral meds   LOWER EXTREMITY ANGIOGRAPHY Left 09/29/2018   Procedure: LOWER EXTREMITY ANGIOGRAPHY;  Surgeon: Algernon Huxley, MD;  Location: [place] [facility];  Service: Cardiovascular;  Laterality: Left;   LUNG BIOPSY  12/30/2011   has lung "spots"   PACEMAKER IMPLANT  07/14/2021   PACEMAKER LEADLESS INSERTION N/A 07/14/2021   Procedure: PACEMAKER  LEADLESS INSERTION;  Surgeon: Isaias Cowman, MD;  Location: [place] [facility];  Service: Cardiovascular;  Laterality: N/A;   PERIPHERAL VASCULAR CATHETERIZATION Left 06/01/2016   Procedure: Lower Extremity Angiography;  Surgeon: Algernon Huxley, MD;  Location: [place] [facility];  Service: Cardiovascular;  Laterality: Left;   PERIPHERAL VASCULAR CATHETERIZATION N/A 06/01/2016   Procedure: Abdominal Aortogram w/Lower Extremity;  Surgeon: Algernon Huxley, MD;  Location: [place] [facility];  Service: Cardiovascular;  Laterality: N/A;   PERIPHERAL VASCULAR CATHETERIZATION  06/01/2016   Procedure: Lower Extremity Intervention;  Surgeon: Algernon Huxley, MD;  Location: [place] [facility];  Service: Cardiovascular;;   PERIPHERAL VASCULAR CATHETERIZATION Right 06/08/2016   Procedure: Lower Extremity Angiography;  Surgeon: Algernon Huxley, MD;  Location: [place] [facility];  Service: Cardiovascular;  Laterality: Right;   PERIPHERAL VASCULAR CATHETERIZATION  06/08/2016   Procedure: Lower Extremity Intervention;  Surgeon: Algernon Huxley, MD;  Location: [place] [facility];  Service: Cardiovascular;;   SUBMANDIBULAR GLAND EXCISION Left 12/06/2020   Procedure: EXCISION SUBMANDIBULAR GLAND;  Surgeon: Beverly Gust, MD;  Location: [place];  Service: ENT;  Laterality: Left;  needs to be first case Diabetic - diet controlled   TEMPORARY PACEMAKER N/A 07/11/2021   Procedure: TEMPORARY PACEMAKER;  Surgeon: Isaias Cowman, MD;  Location: [place] [facility];  Service: Cardiovascular;  Laterality: N/A;    SOCIAL HISTORY:   Social History   Tobacco Use   Smoking status: Former    Packs/day: 1.00    Years: 37.00    Additional pack years: 0.00    Total pack years: 37.00    Types: Cigarettes    Quit date: 02/06/2010    Years since quitting: 12.7   Smokeless tobacco: Former    Types: Snuff  Substance Use Topics   Alcohol use: Not Currently    Alcohol/week: 5.0 standard drinks of alcohol     Types: 5 Cans of beer per week    Comment: /h x of alcohol abuse -stopped 2012- now drinks 5 beer per week    FAMILY HISTORY:   Family History  Problem Relation Age of Onset   Diabetes Mother    Hypercholesterolemia Mother    Lung cancer Father    Diabetes Sister    Diabetes Sister    Hypertension Sister    Diabetes Maternal Grandmother    Diabetes Paternal Grandmother    Heart attack Brother    Coronary artery disease Brother    Vascular Disease Brother    Heart attack Brother    Breast cancer Neg Hx     DRUG ALLERGIES:   Allergies  Allergen Reactions   Trelegy Ellipta [Fluticasone-Umeclidin-Vilant]     Had breathing issues    REVIEW OF SYSTEMS:   ROS As per history of present illness. All pertinent systems were reviewed above. Constitutional, HEENT,  cardiovascular, respiratory, GI, GU, musculoskeletal, neuro, psychiatric, endocrine, integumentary and hematologic systems were reviewed and are otherwise negative/unremarkable except for positive findings mentioned above in the HPI.   MEDICATIONS AT HOME:   Prior to Admission medications   Medication Sig Start Date End Date Taking? Authorizing Provider  Accu-Chek Softclix Lancets lancets Use as instructed for twice a daily Dx E11.65 04/11/21   [name], MD  albuterol (PROVENTIL) (2.5 MG/3ML) 0.083% nebulizer solution USE 1 VIAL IN NEBULIZER EVERY 6 HOURS AS NEEDED FOR WHEEZING 07/02/22   Abernathy, Yetta Flock, NP  albuterol (VENTOLIN HFA) 108 (90 Base) MCG/ACT inhaler INHALE 2 PUFFS BY MOUTH EVERY 6 HOURS AS NEEDED FOR WHEEZING OR SHORTNESS OF BREATH 08/07/22   Jonetta Osgood, NP  ALPRAZolam (XANAX) 0.25 MG tablet TAKE 1 TABLET BY MOUTH THREE TIMES DAILY AS NEEDED FOR ANXIETY 11/04/22   Jonetta Osgood, NP  Blood Glucose Monitoring Suppl (ACCU-CHEK GUIDE) w/Device KIT Use as directed Dx e11.65 04/11/21   [name], MD  clopidogrel (PLAVIX) 75 MG tablet Take 1 tablet by mouth once daily 08/27/22   Kris Hartmann,  NP  Continuous Blood Gluc Receiver (DEXCOM G7 RECEIVER) DEVI Use one as directed for uncontrolled dm 06/09/22   Jonetta Osgood, NP  Continuous Blood Gluc Sensor (DEXCOM G7 SENSOR) MISC Apply 1 sensor to skin on designated areas every 10 days to monitor glucose levels with sensor or smart phone app 07/02/22   Jonetta Osgood, NP  cyclobenzaprine (FLEXERIL) 10 MG tablet Take 1 tablet (10 mg total) by mouth at bedtime. Take one tab po qhs for back spasm prn only 12/11/21   Jonetta Osgood, NP  diclofenac (VOLTAREN) 75 MG EC tablet Take 1 tablet (75 mg total) by mouth 2 (two) times daily. 06/09/22   Jonetta Osgood, NP  escitalopram (LEXAPRO) 10 MG tablet Take 1 tablet (10 mg total) by mouth daily. 06/09/22   Jonetta Osgood, NP  famotidine (PEPCID) 20 MG tablet Take 1 tablet (20 mg total) by mouth daily for 14 days. 06/14/21 09/23/22  Nolberto Hanlon, MD  ferrous sulfate 325 (65 FE) MG tablet Take 325 mg by mouth 2 (two) times daily with a meal.    [provider]  fluticasone (FLONASE) 50 MCG/ACT nasal spray Place 2 sprays into both nostrils daily. 05/09/21   Ezekiel Slocumb, DO  furosemide (LASIX) 20 MG tablet Take 1 tablet (20 mg total) by mouth daily as needed. 06/09/22   Jonetta Osgood, NP  gabapentin (NEURONTIN) 300 MG capsule Take 1 capsule (300 mg total) by mouth 3 (three) times daily. 08/06/22   Abernathy, Yetta Flock, NP  glucose blood (ACCU-CHEK GUIDE) test strip USE STRIP TO CHECK BLOOD SUGAR FIRST THING IN THE MORNING, BEFORE MEALS AND AT BEDTIME. (A TOTAL OF 5 TIMES PER DAY) 11/16/22   Abernathy, Alyssa, NP  insulin detemir (LEVEMIR FLEXTOUCH) 100 UNIT/ML FlexPen Inject 15 Units into the skin daily with supper. 10/08/22   Jonetta Osgood, NP  lisinopril-hydrochlorothiazide (ZESTORETIC) 20-25 MG tablet Take 1 tablet by mouth daily. 02/03/22   [provider]  metFORMIN (GLUCOPHAGE) 500 MG tablet TAKE 2 TABLETS BY MOUTH ONCE DAILY WITH BREAKFAST 09/18/22   Jonetta Osgood,  NP  metoprolol tartrate (LOPRESSOR) 25 MG tablet Take 1/2 (one-half) tablet by mouth twice daily 11/28/21   Jonetta Osgood, NP  mometasone-formoterol (DULERA) 200-5 MCG/ACT AERO Inhale 2 puffs into the lungs 2 (two) times daily. 06/19/22   Jonetta Osgood, NP  Omega-3 Fatty Acids (FISH OIL) 1000 MG CAPS Take 1  capsule by mouth daily.    [provider]  OXYGEN Inhale 4 L into the lungs. PT USES [facility] FOR OXYGEN    [provider]  potassium chloride (KLOR-CON) 10 MEQ tablet TAKE 1 TABLET BY MOUTH EVERY OTHER DAY 08/20/22   Abernathy, Yetta Flock, NP  RELION PEN NEEDLES 31G X 6 MM MISC USE 1  ONCE DAILY AS DIRECTED 10/27/22   Jonetta Osgood, NP  SPIRIVA HANDIHALER 18 MCG inhalation capsule INHALE THE CONTENTS OF ONE CAPSULE BY MOUTH ONCE DAILY. DO NOT SWALLOW CAPSULES. 10/05/22   Jonetta Osgood, NP  traMADol (ULTRAM) 50 MG tablet Take 1 tablet (50 mg total) by mouth every 12 (twelve) hours as needed. 09/23/22   Earlie Server, MD      VITAL SIGNS:  Blood pressure 122/84, pulse 98, temperature 98.2 F (36.8 C), resp. rate 16, height 4\' 11"  (1.499 m), weight 60.9 kg, SpO2 97 %.  PHYSICAL EXAMINATION:  Physical Exam  GENERAL:  67 y.o.-year-old American female patient lying in the bed with mild respiratory distress with conversational dyspnea EYES: Pupils equal, round, reactive to light and accommodation. No scleral icterus. Extraocular muscles intact.  HEENT: Head atraumatic, normocephalic. Oropharynx and nasopharynx clear.  NECK:  Supple, no jugular venous distention. No thyroid enlargement, no tenderness.  LUNGS: Diffuse expiratory wheezes with tight expiratory airflow and harsh vesicular breathing.. No use of accessory muscles of respiration.  CARDIOVASCULAR: Regular rate and rhythm, S1, S2 normal. No murmurs, rubs, or gallops.  ABDOMEN: Soft, nondistended, nontender. Bowel sounds present. No organomegaly or mass.  EXTREMITIES: No pedal edema, cyanosis, or clubbing.   NEUROLOGIC: Cranial nerves II through XII are intact. Muscle strength 5/5 in all extremities. Sensation intact. Gait not checked.  PSYCHIATRIC: The patient is alert and oriented x 3.  Normal affect and good eye contact. SKIN: No obvious rash, lesion, or ulcer.   LABORATORY PANEL:   CBC Recent Labs  Lab 11/18/22 2135  WBC 7.9  HGB 10.5*  HCT 35.6*  PLT 256   ------------------------------------------------------------------------------------------------------------------  Chemistries  Recent Labs  Lab 11/18/22 2135  NA 140  K 3.5  CL 101  CO2 29  GLUCOSE 132*  BUN 16  CREATININE 0.70  CALCIUM 9.4   ------------------------------------------------------------------------------------------------------------------  Cardiac Enzymes No results for input(s): "TROPONINI" in the last 168 hours. ------------------------------------------------------------------------------------------------------------------  RADIOLOGY:  DG Chest 1 View  Result Date: 11/18/2022 CLINICAL DATA:  Hypoxia, COPD, short of breath, history of lung cancer EXAM: CHEST  1 VIEW COMPARISON:  09/21/2022, 06/05/2022 FINDINGS: Single frontal view of the chest demonstrates a stable cardiac silhouette. Chronic post therapeutic changes are seen at the right lung base. No new airspace disease, effusion, or pneumothorax. Stable background emphysema. No acute bony abnormality. The acute right ninth rib fracture seen on prior study is not well visualized on this exam. IMPRESSION: 1. Stable post therapeutic changes at the right lung base. 2. Emphysema. 3. No acute airspace disease. Electronically Signed   By: Randa Ngo M.D.   On: 11/18/2022 22:04      IMPRESSION AND PLAN:  Assessment and Plan: * COPD exacerbation - The patient will be admitted to a medically monitored bed. - We will place the patient IV steroid therapy with IV Solu-Medrol as well as nebulized bronchodilator therapy with duonebs q.i.d. and q.4  hours p.r.n.Marland Kitchen - Mucolytic therapy will be provided with Mucinex and antibiotic therapy with IV Rocephin. - O2 protocol will be followed. - We will continue Spiriva.   Acute on chronic respiratory failure with  hypoxia - O2 protocol will be followed. - Management otherwise as above.  Type 2 diabetes mellitus with peripheral neuropathy - The patient will be placed on supplemental coverage with NovoLog. - We will continue Neurontin.  GERD without esophagitis - We will continue new H2 blocker therapy.  Anxiety and depression - We will continue Xanax and Lexapro.  Essential hypertension - We will continue her antihypertensives       DVT prophylaxis: Lovenox.  Advanced Care Planning:  Code Status: full code.  Family Communication:  The plan of care was discussed in details with the patient (and family). I answered all questions. The patient agreed to proceed with the above mentioned plan. Further management will depend upon hospital course. Disposition Plan: Back to previous home environment Consults called: none.  All the records are reviewed and case discussed with ED provider.  Status is: Inpatient   At the time of the admission, it appears that the appropriate admission status for this patient is inpatient.  This is judged to be reasonable and necessary in order to provide the required intensity of service to ensure the patient's safety given the presenting symptoms, physical exam findings and initial radiographic and laboratory data in the context of comorbid conditions.  The patient requires inpatient status due to high intensity of service, high risk of further deterioration and high frequency of surveillance required.  I certify that at the time of admission, it is my clinical judgment that the patient will require inpatient hospital care extending more than 2 midnights.                            Dispo: The patient is from: Home              Anticipated d/c is to: Home               Patient currently is not medically stable to d/c.              Difficult to place patient: No  Christel Mormon M.D on 11/19/2022 at 4:46 AM  [facility]   From 7 PM-7 AM, contact night-coverage [email or web address]  CC: Primary care physician; Jonetta Osgood, NP

## 2022-11-18 NOTE — ED Provider Notes (Signed)
[facility] Provider Note    Event Date/Time   First MD Initiated Contact with Patient 11/18/22 2120     (approximate)   History   Shortness of Breath   HPI  Angela Jensen is a 67 y.o. female past medical history significant for COPD on chronic 3 L of home oxygen, hypertension, who presents to the emergency department shortness of breath.  Shortness of breath started yesterday and has been progressively worsening.  Has been wearing her 3 L nasal cannula and states that she has been getting very short of breath with minimal exertion on her 3 L.  Endorses compliance with all of her home medications.  When EMS arrived today patient was found to be hypoxic with an SpO2 of 80% on her 3 L nasal cannula.  Increased to 4 L and was given a DuoNeb treatment.  Patient denies any chest pain.  Denies any nausea or vomiting.  No fever or chills.  Denies any productive cough or change of sputum production.  No lower extremity swelling.  Denies history of DVT or PE.  No recent surgery.  No recent hospitalization.  No prior need of BiPAP.  Full code.     Physical Exam   Triage Vital Signs: ED Triage Vitals  Enc Vitals Group     BP 11/18/22 2118 (!) 178/98     Pulse Rate 11/18/22 2118 96     Resp 11/18/22 2118 (!) 21     Temp 11/18/22 2118 98 F (36.7 C)     Temp Source 11/18/22 2118 Oral     SpO2 11/18/22 2118 98 %     Weight 11/18/22 2120 123 lb (55.8 kg)     Height 11/18/22 2120 4\' 11"  (1.499 m)     Head Circumference --      Peak Flow --      Pain Score 11/18/22 2119 0     Pain Loc --      Pain Edu? --      Excl. in [place]? --     Most recent vital signs: Vitals:   11/18/22 2118  BP: (!) 178/98  Pulse: 96  Resp: (!) 21  Temp: 98 F (36.7 C)  SpO2: 98%    Physical Exam Constitutional:      Appearance: She is well-developed.  HENT:     Head: Atraumatic.  Eyes:     Conjunctiva/sclera: Conjunctivae normal.  Cardiovascular:     Rate and Rhythm: Regular  rhythm.  Pulmonary:     Effort: Tachypnea and respiratory distress present.     Breath sounds: Wheezing present.     Comments: Speaking in 2 word sentences.  Tachypneic.  Poor air movement with end expiratory wheeze. Abdominal:     General: There is no distension.     Palpations: Abdomen is soft.     Tenderness: There is no abdominal tenderness.  Musculoskeletal:        General: Normal range of motion.     Cervical back: Normal range of motion.     Right lower leg: No edema.     Left lower leg: No edema.  Skin:    General: Skin is warm.     Capillary Refill: Capillary refill takes less than 2 seconds.  Neurological:     Mental Status: She is alert. Mental status is at baseline.     IMPRESSION / MDM / ASSESSMENT AND PLAN / ED COURSE  I reviewed the triage vital signs and the nursing  notes.  Differential diagnosis includes COPD exacerbation, CHF, pneumonia, COVID/influenza, ACS, anemia  EKG  I, Nathaniel Man, the attending physician, personally viewed and interpreted this ECG.   Rate: Normal  Rhythm: Normal sinus  Axis: Normal  Intervals: Incomplete right bundle branch block  ST&T Change: Nonspecific ST depression No significant change when compared to prior EKG  No tachycardic or bradycardic dysrhythmias while on cardiac telemetry.  RADIOLOGY I independently reviewed imaging, my interpretation of imaging: Right lower lobe opacity.  Findings of emphysema.  Read as stable to the right lower lung base.  No acute findings.  LABS (all labs ordered are listed, but only abnormal results are displayed) Labs interpreted as -    Labs Reviewed  CBC - Abnormal; Notable for the following components:      Result Value   RBC 3.69 (*)    Hemoglobin 10.5 (*)    HCT 35.6 (*)    MCHC 29.5 (*)    All other components within normal limits  BASIC METABOLIC PANEL - Abnormal; Notable for the following components:   Glucose, Bld 132 (*)    All other components within normal limits   BLOOD GAS, VENOUS - Abnormal; Notable for the following components:   Bicarbonate 34.1 (*)    Acid-Base Excess 6.9 (*)    All other components within normal limits  LACTIC ACID, PLASMA - Abnormal; Notable for the following components:   Lactic Acid, Venous 2.4 (*)    All other components within normal limits  CULTURE, BLOOD (ROUTINE X 2)  CULTURE, BLOOD (ROUTINE X 2)  BRAIN NATRIURETIC PEPTIDE  LACTIC ACID, PLASMA  TROPONIN I (HIGH SENSITIVITY)     MDM  Patient treated with DuoNeb treatment continuously, IV Solu-Medrol  Lab work with anemia but is at her baseline, does not meet criteria for transfusion.  No significant leukocytosis.  No hypercarbia.  Elevated lactic acid.  Added on blood cultures.  Clinical picture is most consistent with a COPD exacerbation and a low suspicion for sepsis.  Chest x-ray without focal findings consistent with pneumonia.  Read as no acute findings.  Patient given IV Rocephin for COPD exacerbation.    On reevaluation continues to have increased work of breathing.  Consulted hospitalist for admission for acute on chronic hypoxic respiratory failure in the setting of a COPD exacerbation.       PROCEDURES:  Critical Care performed: yes  .Critical Care  Performed by: Nathaniel Man, MD Authorized by: Nathaniel Man, MD   Critical care provider statement:    Critical care time (minutes):  30   Critical care was necessary to treat or prevent imminent or life-threatening deterioration of the following conditions:  Respiratory failure   Critical care was time spent personally by me on the following activities:  Development of treatment plan with patient or surrogate, discussions with consultants, evaluation of patient's response to treatment, examination of patient, ordering and review of laboratory studies, ordering and review of radiographic studies, ordering and performing treatments and interventions, pulse oximetry, re-evaluation of patient's  condition and review of old charts   Patient's presentation is most consistent with acute presentation with potential threat to life or bodily function.   MEDICATIONS ORDERED IN ED: Medications  cefTRIAXone (ROCEPHIN) 1 g in sodium chloride 0.9 % 100 mL IVPB (has no administration in time range)  methylPREDNISolone sodium succinate (SOLU-MEDROL) 125 mg/2 mL injection 125 mg (125 mg Intravenous Given 11/18/22 2135)  ipratropium-albuterol (DUONEB) 0.5-2.5 (3) MG/3ML nebulizer solution 9 mL (9 mLs Nebulization Given  11/18/22 2135)    FINAL CLINICAL IMPRESSION(S) / ED DIAGNOSES   Final diagnoses:  Acute on chronic hypoxic respiratory failure  COPD exacerbation     Rx / DC Orders   ED Discharge Orders     None        Note:  This document was prepared using Dragon voice recognition software and may include unintentional dictation errors.   Nathaniel Man, MD 11/18/22 2225

## 2022-11-18 NOTE — ED Triage Notes (Signed)
Patient called EMS for shortness of breath and was found to have saturation of 80%.  Hx of COPD.  Duoneb treatment given in route. Arrives with saturation of 95% on 3LNC.

## 2022-11-19 DIAGNOSIS — J9611 Chronic respiratory failure with hypoxia: Secondary | ICD-10-CM | POA: Diagnosis not present

## 2022-11-19 DIAGNOSIS — K219 Gastro-esophageal reflux disease without esophagitis: Secondary | ICD-10-CM | POA: Insufficient documentation

## 2022-11-19 DIAGNOSIS — J441 Chronic obstructive pulmonary disease with (acute) exacerbation: Secondary | ICD-10-CM | POA: Diagnosis not present

## 2022-11-19 DIAGNOSIS — F32A Depression, unspecified: Secondary | ICD-10-CM | POA: Insufficient documentation

## 2022-11-19 DIAGNOSIS — E1165 Type 2 diabetes mellitus with hyperglycemia: Secondary | ICD-10-CM

## 2022-11-19 DIAGNOSIS — E1142 Type 2 diabetes mellitus with diabetic polyneuropathy: Secondary | ICD-10-CM | POA: Insufficient documentation

## 2022-11-19 LAB — BASIC METABOLIC PANEL
Anion gap: 9 (ref 5–15)
BUN: 16 mg/dL (ref 8–23)
CO2: 28 mmol/L (ref 22–32)
Calcium: 9 mg/dL (ref 8.9–10.3)
Chloride: 103 mmol/L (ref 98–111)
Creatinine, Ser: 0.66 mg/dL (ref 0.44–1.00)
GFR, Estimated: >60 mL/min (ref >60)
Glucose, Bld: 290 mg/dL — ABNORMAL HIGH (ref 70–99)
Potassium: 4.1 mmol/L (ref 3.5–5.1)
Sodium: 140 mmol/L (ref 135–145)

## 2022-11-19 LAB — BRAIN NATRIURETIC PEPTIDE: B Natriuretic Peptide: 80.4 pg/mL (ref 0.0–100.0)

## 2022-11-19 LAB — CBC
HCT: 32.1 % — ABNORMAL LOW (ref 36.0–46.0)
Hemoglobin: 9.8 g/dL — ABNORMAL LOW (ref 12.0–15.0)
MCH: 28.5 pg (ref 26.0–34.0)
MCHC: 30.5 g/dL (ref 30.0–36.0)
MCV: 93.3 fL (ref 80.0–100.0)
Platelets: 255 10*3/uL (ref 150–400)
RBC: 3.44 MIL/uL — ABNORMAL LOW (ref 3.87–5.11)
RDW: 14.9 % (ref 11.5–15.5)
WBC: 8.8 10*3/uL (ref 4.0–10.5)
nRBC: 0 % (ref 0.0–0.2)

## 2022-11-19 LAB — GLUCOSE, CAPILLARY
Glucose-Capillary: 296 mg/dL — ABNORMAL HIGH (ref 70–99)
Glucose-Capillary: 171 mg/dL — ABNORMAL HIGH (ref 70–99)
Glucose-Capillary: 220 mg/dL — ABNORMAL HIGH (ref 70–99)
Glucose-Capillary: 246 mg/dL — ABNORMAL HIGH (ref 70–99)

## 2022-11-19 LAB — LACTIC ACID, PLASMA: Lactic Acid, Venous: 1.5 mmol/L (ref 0.5–1.9)

## 2022-11-19 LAB — TROPONIN I (HIGH SENSITIVITY): Troponin I (High Sensitivity): 8 ng/L (ref <18)

## 2022-11-19 LAB — HIV ANTIBODY (ROUTINE TESTING W REFLEX): HIV Screen 4th Generation wRfx: NONREACTIVE

## 2022-11-19 MED ORDER — IPRATROPIUM-ALBUTEROL 0.5-2.5 (3) MG/3ML IN SOLN: 3.0000 mL | RESPIRATORY_TRACT | Status: DC | PRN

## 2022-11-19 MED ORDER — INSULIN DETEMIR 100 UNIT/ML ~~LOC~~ SOLN
8.0000 [IU] | Freq: Two times a day (BID) | SUBCUTANEOUS | Status: DC
  Administered 2022-11-19 – 2022-11-20 (×3): 8 [IU] via SUBCUTANEOUS
  Filled 2022-11-19 (×4): qty 0.08

## 2022-11-19 MED ORDER — IPRATROPIUM-ALBUTEROL 0.5-2.5 (3) MG/3ML IN SOLN
3.0000 mL | Freq: Three times a day (TID) | RESPIRATORY_TRACT | Status: DC
  Administered 2022-11-19 – 2022-11-20 (×4): 3 mL via RESPIRATORY_TRACT
  Filled 2022-11-19 (×4): qty 3

## 2022-11-19 MED ORDER — OYSTER SHELL CALCIUM/D3 500-5 MG-MCG PO TABS
1.0000 | ORAL_TABLET | Freq: Two times a day (BID) | ORAL | Status: DC
  Administered 2022-11-19 – 2022-11-20 (×2): 1 via ORAL
  Filled 2022-11-19 (×3): qty 1

## 2022-11-19 MED ORDER — INSULIN ASPART 100 UNIT/ML IJ SOLN
0.0000 [IU] | Freq: Three times a day (TID) | INTRAMUSCULAR | Status: DC
  Administered 2022-11-19: 5 [IU] via SUBCUTANEOUS
  Administered 2022-11-19: 3 [IU] via SUBCUTANEOUS
  Administered 2022-11-19: 2 [IU] via SUBCUTANEOUS
  Administered 2022-11-20: 3 [IU] via SUBCUTANEOUS
  Administered 2022-11-20: 7 [IU] via SUBCUTANEOUS
  Filled 2022-11-19 (×5): qty 1

## 2022-11-19 MED ORDER — HYDROCHLOROTHIAZIDE 25 MG PO TABS
25.0000 mg | ORAL_TABLET | Freq: Every day | ORAL | Status: DC
  Administered 2022-11-19 – 2022-11-20 (×2): 25 mg via ORAL
  Filled 2022-11-19 (×2): qty 1

## 2022-11-19 MED ORDER — LISINOPRIL 20 MG PO TABS
20.0000 mg | ORAL_TABLET | Freq: Every day | ORAL | Status: DC
  Administered 2022-11-19 – 2022-11-20 (×2): 20 mg via ORAL
  Filled 2022-11-19 (×2): qty 1

## 2022-11-19 NOTE — Assessment & Plan Note (Addendum)
-   The patient will be admitted to a medically monitored bed. - We will place the patient IV steroid therapy with IV Solu-Medrol as well as nebulized bronchodilator therapy with duonebs q.i.d. and q.4 hours p.r.n.Marland Kitchen - Mucolytic therapy will be provided with Mucinex and antibiotic therapy with IV Rocephin. - O2 protocol will be followed. - We will continue Spiriva.

## 2022-11-19 NOTE — Progress Notes (Signed)
  Progress Note   Patient: Angela Jensen [id number] DOB: [birth year] DOA: 11/18/2022     1 DOS: the patient was seen and examined on 11/19/2022   Brief hospital course: LIZABETH OSTING is a 67 y.o. African-American female with medical history significant for asthma, COPD, depression, type diabetes mellitus, essential hypertension, CHF, chronic respiratory failure on home O2 at 2 to 3 L/min, PAD and OSA, who presented to the ER with acute onset of worsening dyspnea with associated cough and wheezing.  Chest x-ray did not show any pneumonia.  She is diagnosed with COPD exacerbation, started on steroids.   Principal Problem:   COPD exacerbation Active Problems:   Essential hypertension   Chronic hypoxemic respiratory failure   Anxiety and depression   GERD without esophagitis   Type 2 diabetes mellitus with peripheral neuropathy   Assessment and Plan: * COPD exacerbation Chronic hypoxemic respite failure Acute respite failure ruled out. Patient does not seem to have any pneumonia, discontinue antibiotics.  She is on 2 to 3 L oxygen at home, still at baseline.   Continue IV steroids and transition to oral for COPD exacerbation. Continue bronchodilator. Patient may be able to discharge home tomorrow.   Chronic diastolic congestive heart failure. Appears euvolemic, discontinue IV fluids.  Type 2 diabetes mellitus with peripheral neuropathy Changing Levemir to 8 units twice a day, continue sliding scale insulin.  GERD without esophagitis PPI  Anxiety and depression Xanax and Lexapro.  Essential hypertension - We will continue her antihypertensives       Subjective:  Patient feels better today, still short of breath with exertion.  Cough.  Nonproductive.  Physical Exam: Vitals:   11/19/22 0723 11/19/22 0822 11/19/22 0940 11/19/22 1003  BP:  (!) 178/96 (!) 157/93 (!) 157/93  Pulse:  (!) 102 99 71  Resp:  15    Temp:  98.7 F (37.1 C)    TempSrc:  Oral    SpO2: 96% 95%  95%   Weight:      Height:       General exam: Appears calm and comfortable  Respiratory system: Decreased breath sounds. Respiratory effort normal. Cardiovascular system: S1 & S2 heard, RRR. No JVD, murmurs, rubs, gallops or clicks. No pedal edema. Gastrointestinal system: Abdomen is nondistended, soft and nontender. No organomegaly or masses felt. Normal bowel sounds heard. Central nervous system: Alert and oriented. No focal neurological deficits. Extremities: Symmetric 5 x 5 power. Skin: No rashes, lesions or ulcers Psychiatry: Judgement and insight appear normal. Mood & affect appropriate.    Data Reviewed:  Reviewed chest x-ray and lab results.  Family Communication: None  Disposition: Status is: Inpatient Remains inpatient appropriate because: Severity of disease, IV treatment.     Time spent: 35 minutes  Author: Sharen Hones, MD 11/19/2022 11:53 AM  For on call review [email or web address].

## 2022-11-19 NOTE — Assessment & Plan Note (Signed)
-   We will continue new H2 blocker therapy.

## 2022-11-19 NOTE — Inpatient Diabetes Management (Signed)
Inpatient Diabetes Program Recommendations  AACE/ADA: New Consensus Statement on Inpatient Glycemic Control (2015)  Target Ranges:  Prepandial:   less than 140 mg/dL      Peak postprandial:   less than 180 mg/dL (1-2 hours)      Critically ill patients:  140 - 180 mg/dL   Lab Results  Component Value Date   GLUCAP 296 (H) 11/19/2022   HGBA1C 6.6 (A) 06/09/2022    Review of Glycemic Control  Latest Reference Range & Units 11/19/22 08:18  Glucose-Capillary 70 - 99 mg/dL 296 (H)  (H): Data is abnormally high  Diabetes history: DM2 Outpatient Diabetes medications: Levemir 15 units QD, Metformin 1000 mg QD Current orders for Inpatient glycemic control: Levemir 15 QHS, Novolog 0-9 units TID, Solumedrol 40 mg Q12H  Inpatient Diabetes Program Recommendations:    Novolog 0-20 units TID and 0-5 QHS Could administer Levemir 15 units now then QD  Will continue to follow while inpatient.  Thank you, Reche Dixon, MSN, [place] Diabetes Coordinator Inpatient Diabetes Program [phone] (team pager from 8a-5p)

## 2022-11-19 NOTE — Assessment & Plan Note (Addendum)
supplemental coverage with NovoLog. continue Neurontin. 

## 2022-11-19 NOTE — Assessment & Plan Note (Signed)
-   We will continue Xanax and Lexapro 

## 2022-11-19 NOTE — Assessment & Plan Note (Signed)
-   We will continue her antihypertensives. 

## 2022-11-19 NOTE — Assessment & Plan Note (Signed)
-   O2 protocol will be followed. - Management otherwise as above. 

## 2022-11-19 NOTE — Hospital Course (Signed)
Angela Jensen is a 67 y.o. African-American female with medical history significant for asthma, COPD, depression, type diabetes mellitus, essential hypertension, CHF, chronic respiratory failure on home O2 at 2 to 3 L/min, PAD and OSA, who presented to the ER with acute onset of worsening dyspnea with associated cough and wheezing.  Chest x-ray did not show any pneumonia.  She is diagnosed with COPD exacerbation, started on steroids.

## 2022-11-20 DIAGNOSIS — J9611 Chronic respiratory failure with hypoxia: Secondary | ICD-10-CM | POA: Diagnosis not present

## 2022-11-20 DIAGNOSIS — Z794 Long term (current) use of insulin: Secondary | ICD-10-CM

## 2022-11-20 DIAGNOSIS — J441 Chronic obstructive pulmonary disease with (acute) exacerbation: Secondary | ICD-10-CM | POA: Diagnosis not present

## 2022-11-20 DIAGNOSIS — E1165 Type 2 diabetes mellitus with hyperglycemia: Secondary | ICD-10-CM | POA: Diagnosis not present

## 2022-11-20 LAB — GLUCOSE, CAPILLARY
Glucose-Capillary: 337 mg/dL — ABNORMAL HIGH (ref 70–99)
Glucose-Capillary: 203 mg/dL — ABNORMAL HIGH (ref 70–99)

## 2022-11-20 MED ORDER — OYSTER SHELL CALCIUM/D3 500-5 MG-MCG PO TABS: 1.0000 | ORAL_TABLET | Freq: Two times a day (BID) | ORAL | 0 refills | Status: DC

## 2022-11-20 MED ORDER — PREDNISONE 20 MG PO TABS: ORAL_TABLET | ORAL | 0 refills | Status: AC

## 2022-11-20 MED ORDER — GUAIFENESIN ER 600 MG PO TB12: 600.0000 mg | ORAL_TABLET | Freq: Two times a day (BID) | ORAL | 0 refills | Status: AC

## 2022-11-20 NOTE — Care Management Important Message (Signed)
Important Message  Patient Details  Name: Angela Jensen MRN: [id number] Date of Birth: [birth year]   Medicare Important Message Given:  Yes     Johnell Comings 11/20/2022, 10:56 AM

## 2022-11-20 NOTE — TOC CM/SW Note (Signed)
  Transition of Care [facility]) Screening Note   Patient Details  Name: Angela Jensen Date of Birth: 1956-08-05   Transition of Care [facility]) CM/SW Contact:    Allena Katz, LCSW Phone Number: 11/20/2022, 10:08 AM    Transition of Care Department [facility]) has reviewed patient and no TOC needs have been identified at this time. We will continue to monitor patient advancement through interdisciplinary progression rounds. If new patient transition needs arise, please place a TOC consult.

## 2022-11-20 NOTE — Progress Notes (Signed)
SATURATION QUALIFICATIONS: (This note is used to comply with regulatory documentation for home oxygen)  Patient Saturations on Room Air at Rest = 98% Patient Saturations on Room Air while Ambulating = 76%  Patient Saturations on 3 Liters of oxygen while Ambulating = 91%  Please briefly explain why patient needs home oxygen:  Patient ambulated with mobility tech. While ambulating on RA O2 sats dropped to 76% and HR up to 156. Patient rested while standing for a couple of minutes, 3L O2 applied per [place]. O2 sats up to 91%.  Per patient she uses 2-2.5L O2 at home per .

## 2022-11-20 NOTE — Discharge Summary (Signed)
Physician Discharge Summary   Patient: Angela Jensen MRN: [id number] DOB: [birth year]  Admit date:     11/18/2022  Discharge date: 11/20/22  Discharge Physician: Marrion Coyekui Matias Thurman   PCP: Sallyanne KusterAbernathy, Alyssa, NP   Recommendations at discharge:   Follow-up with PCP in 1 week.  Discharge Diagnoses: Principal Problem:   COPD exacerbation Active Problems:   Essential hypertension   Chronic hypoxemic respiratory failure   Anxiety and depression   GERD without esophagitis   Uncontrolled type 2 diabetes mellitus with hyperglycemia, with long-term current use of insulin  Resolved Problems:   * No resolved hospital problems. [facility]*  Hospital Course: Angela Jensen is a 67 y.o. African-American female with medical history significant for asthma, COPD, depression, type diabetes mellitus, essential hypertension, CHF, chronic respiratory failure on home O2 at 2 to 3 L/min, PAD and OSA, who presented to the ER with acute onset of worsening dyspnea with associated cough and wheezing.  Chest x-ray did not show any pneumonia.  She is diagnosed with COPD exacerbation, started on steroids.  Assessment and Plan:   COPD exacerbation Chronic hypoxemic respite failure Acute respite failure ruled out. Patient does not seem to have any pneumonia, discontinue antibiotics.  She is on 2 to 3 L oxygen at home, still at baseline.   Her condition appears to be secondary to allergy with exposure to smoke.  She is treated with IV steroids, transition oral steroids.  Due to clinical rising glucose, prednisone will decrease to 20 mg daily, will complete 6 days of tapering. Patient is medically stable to be discharged, she is advised to follow-up with PCP in 1 week.     Chronic diastolic congestive heart failure. Appears euvolemic   Type 2 diabetes mellitus with peripheral neuropathy Uncontrolled type 2 diabetes with hyperglycemia. Glucose running high due to steroid use.  Quickly taper down steroids, follow-up with PCP as  outpatient and resume home dose insulin.   GERD without esophagitis PPI   Anxiety and depression Xanax and Lexapro.   Essential hypertension Resume home medicines.        Consultants: None Procedures performed: None  Disposition: Home Diet recommendation:  Discharge Diet Orders (From admission, onward)     Start     Ordered   11/20/22 0000  Diet - low sodium heart healthy        11/20/22 0939           Cardiac diet DISCHARGE MEDICATION: Allergies as of 11/20/2022       Reactions   Trelegy Ellipta [fluticasone-umeclidin-vilant]    Had breathing issues        Medication List     TAKE these medications    Accu-Chek Guide test strip Generic drug: glucose blood USE STRIP TO CHECK BLOOD SUGAR FIRST THING IN THE MORNING, BEFORE MEALS AND AT BEDTIME. (A TOTAL OF 5 TIMES PER DAY)   Accu-Chek Guide w/Device Kit Use as directed Dx e11.65   Accu-Chek Softclix Lancets lancets Use as instructed for twice a daily Dx E11.65   albuterol (2.5 MG/3ML) 0.083% nebulizer solution Commonly known as: PROVENTIL USE 1 VIAL IN NEBULIZER EVERY 6 HOURS AS NEEDED FOR WHEEZING   albuterol 108 (90 Base) MCG/ACT inhaler Commonly known as: VENTOLIN HFA INHALE 2 PUFFS BY MOUTH EVERY 6 HOURS AS NEEDED FOR WHEEZING OR SHORTNESS OF BREATH   ALPRAZolam 0.25 MG tablet Commonly known as: XANAX TAKE 1 TABLET BY MOUTH THREE TIMES DAILY AS NEEDED FOR ANXIETY   calcium-vitamin D 500-5 MG-MCG tablet Commonly  known as: OSCAL WITH D Take 1 tablet by mouth 2 (two) times daily.   clopidogrel 75 MG tablet Commonly known as: PLAVIX Take 1 tablet by mouth once daily   cyclobenzaprine 10 MG tablet Commonly known as: FLEXERIL Take 1 tablet (10 mg total) by mouth at bedtime. Take one tab po qhs for back spasm prn only   Dexcom G7 Receiver Devi Use one as directed for uncontrolled dm   Dexcom G7 Sensor Misc Apply 1 sensor to skin on designated areas every 10 days to monitor glucose levels  with sensor or smart phone app   diclofenac 75 MG EC tablet Commonly known as: VOLTAREN Take 1 tablet (75 mg total) by mouth 2 (two) times daily.   escitalopram 10 MG tablet Commonly known as: LEXAPRO Take 1 tablet (10 mg total) by mouth daily.   famotidine 20 MG tablet Commonly known as: PEPCID Take 1 tablet (20 mg total) by mouth daily for 14 days.   ferrous sulfate 325 (65 FE) MG tablet Take 325 mg by mouth 2 (two) times daily with a meal.   Fish Oil 1000 MG Caps Take 1 capsule by mouth daily.   fluticasone 50 MCG/ACT nasal spray Commonly known as: FLONASE Place 2 sprays into both nostrils daily.   furosemide 20 MG tablet Commonly known as: LASIX Take 1 tablet (20 mg total) by mouth daily as needed.   gabapentin 300 MG capsule Commonly known as: NEURONTIN Take 1 capsule (300 mg total) by mouth 3 (three) times daily.   guaiFENesin 600 MG 12 hr tablet Commonly known as: Mucinex Take 1 tablet (600 mg total) by mouth 2 (two) times daily for 7 days.   Levemir FlexTouch 100 UNIT/ML FlexPen Generic drug: insulin detemir Inject 15 Units into the skin daily with supper.   lisinopril-hydrochlorothiazide 20-25 MG tablet Commonly known as: ZESTORETIC Take 1 tablet by mouth daily.   metFORMIN 500 MG tablet Commonly known as: GLUCOPHAGE TAKE 2 TABLETS BY MOUTH ONCE DAILY WITH BREAKFAST   metoprolol tartrate 25 MG tablet Commonly known as: LOPRESSOR Take 1/2 (one-half) tablet by mouth twice daily   mometasone-formoterol 200-5 MCG/ACT Aero Commonly known as: DULERA Inhale 2 puffs into the lungs 2 (two) times daily.   OXYGEN Inhale 4 L into the lungs. PT USES [facility] FOR OXYGEN   potassium chloride 10 MEQ tablet Commonly known as: KLOR-CON TAKE 1 TABLET BY MOUTH EVERY OTHER DAY   predniSONE 20 MG tablet Commonly known as: DELTASONE Take 1 tablet (20 mg total) by mouth daily with breakfast for 3 days, THEN 0.5 tablets (10 mg total) daily with breakfast for 3  days. Start taking on: November 21, 2022   ReliOn Pen Needles 31G X 6 MM Misc Generic drug: Insulin Pen Needle USE 1  ONCE DAILY AS DIRECTED   Spiriva HandiHaler 18 MCG inhalation capsule Generic drug: tiotropium INHALE THE CONTENTS OF ONE CAPSULE BY MOUTH ONCE DAILY. DO NOT SWALLOW CAPSULES.   traMADol 50 MG tablet Commonly known as: ULTRAM Take 1 tablet (50 mg total) by mouth every 12 (twelve) hours as needed.        Follow-up Information     Abernathy, Arlyss Repress, NP Follow up in 1 week(s).   Specialty: Nurse Practitioner Contact information: [address] [place] [address] [phone]                Discharge Exam: [facility] Weights   11/18/22 2120 11/19/22 0146  Weight: 55.8 kg 60.9 kg   General exam:  Appears calm and comfortable  Respiratory system: Decreased breath sounds. Respiratory effort normal. Cardiovascular system: S1 & S2 heard, RRR. No JVD, murmurs, rubs, gallops or clicks. No pedal edema. Gastrointestinal system: Abdomen is nondistended, soft and nontender. No organomegaly or masses felt. Normal bowel sounds heard. Central nervous system: Alert and oriented. No focal neurological deficits. Extremities: Symmetric 5 x 5 power. Skin: No rashes, lesions or ulcers Psychiatry: Judgement and insight appear normal. Mood & affect appropriate.    Condition at discharge: fair  The results of significant diagnostics from this hospitalization (including imaging, microbiology, ancillary and laboratory) are listed below for reference.   Imaging Studies: DG Chest 1 View  Result Date: 11/18/2022 CLINICAL DATA:  Hypoxia, COPD, short of breath, history of lung cancer EXAM: CHEST  1 VIEW COMPARISON:  09/21/2022, 06/05/2022 FINDINGS: Single frontal view of the chest demonstrates a stable cardiac silhouette. Chronic post therapeutic changes are seen at the right lung base. No new airspace disease, effusion, or pneumothorax. Stable background emphysema. No acute bony  abnormality. The acute right ninth rib fracture seen on prior study is not well visualized on this exam. IMPRESSION: 1. Stable post therapeutic changes at the right lung base. 2. Emphysema. 3. No acute airspace disease. Electronically Signed   By: Sharlet Salina M.D.   On: 11/18/2022 22:04    Microbiology: Results for orders placed or performed during the hospital encounter of 11/18/22  Culture, blood (routine x 2)     Status: None (Preliminary result)   Collection Time: 11/18/22 10:43 PM   Specimen: BLOOD  Result Value Ref Range Status   Specimen Description BLOOD LEFT HAND  Final   Special Requests   Final    BOTTLES DRAWN AEROBIC AND ANAEROBIC Blood Culture results may not be optimal due to an excessive volume of blood received in culture bottles   Culture   Final    NO GROWTH 2 DAYS Performed at [facility], [address] [place], [place] [address]    Report Status PENDING  Incomplete  Culture, blood (routine x 2)     Status: None (Preliminary result)   Collection Time: 11/18/22 10:43 PM   Specimen: BLOOD  Result Value Ref Range Status   Specimen Description BLOOD LEFT ARM  Final   Special Requests   Final    BOTTLES DRAWN AEROBIC AND ANAEROBIC Blood Culture adequate volume   Culture   Final    NO GROWTH 2 DAYS Performed at [facility], [address] [place], [place] [address]    Report Status PENDING  Incomplete    Labs: CBC: Recent Labs  Lab 11/18/22 2135 11/19/22 0536  WBC 7.9 8.8  HGB 10.5* 9.8*  HCT 35.6* 32.1*  MCV 96.5 93.3  PLT 256 255   Basic Metabolic Panel: Recent Labs  Lab 11/18/22 2135 11/19/22 0536  NA 140 140  K 3.5 4.1  CL 101 103  CO2 29 28  GLUCOSE 132* 290*  BUN 16 16  CREATININE 0.70 0.66  CALCIUM 9.4 9.0   Liver Function Tests: No results for input(s): "AST", "ALT", "ALKPHOS", "BILITOT", "PROT", "ALBUMIN" in the last 168 hours. CBG: Recent Labs  Lab 11/19/22 0818 11/19/22 1315 11/19/22 1656  11/19/22 2100 11/20/22 0752  GLUCAP 296* 171* 220* 246* 337*    Discharge time spent: greater than 30 minutes.  Signed: Marrion Coy, MD [facility] 11/20/2022

## 2022-11-20 NOTE — Progress Notes (Addendum)
Mobility Specialist - Progress Note   11/20/22 0900  Mobility  Activity Ambulated with assistance in hallway  Level of Assistance Modified independent, requires aide device or extra time  Assistive Device Front wheel walker  Distance Ambulated (ft) 160 ft  Activity Response Tolerated well  $Mobility charge 1 Mobility    Nurse requested Mobility Specialist to perform oxygen saturation test with pt which includes removing pt from oxygen both at rest and while ambulating.  Below are the results from that testing.     O2 on RA at rest = 98% O2 on RA while AMB = 76% O2 on 3L while AMB = 91%   Pt lying in bed upon arrival, utilizing 2L. Pt completed bed mobility and ambulation modI. Began ambulation in hallway on RA. Halfway through loop around NS; HR increased to 130s so rest break was taken, however HR continued to briefly increase at rest to 156 bpm with O2 then dropping from low 90s to high 70s. No s/s of distress, pt states this has been baseline for her. 3L O2 applied. Pt continued denying feeling SOB or symptomatic. Pt rested ~ 2 minutes to lower HR to 98 bpm and increase O2 back to low 90s before proceeding with activity. Pt returned to bed with needs in reach. Back on 2L. RN notified. HR 88, O2 93% prior to exit.   Filiberto Pinks Mobility Specialist 11/20/22, 9:38 AM

## 2022-11-20 NOTE — Progress Notes (Signed)
Patient is being discharged home. Discharge papers given and explained to patient. She verbalized understanding. Medications and f/u appointment reviewed. Rx sent electronically to the pharmacy. Patient made aware. Awaiting transportation.

## 2022-11-22 ENCOUNTER — Other Ambulatory Visit: Payer: Self-pay | Admitting: Nurse Practitioner

## 2022-11-22 DIAGNOSIS — Z760 Encounter for issue of repeat prescription: Secondary | ICD-10-CM

## 2022-11-23 LAB — CULTURE, BLOOD (ROUTINE X 2)
Culture: NO GROWTH
Culture: NO GROWTH
Special Requests: ADEQUATE

## 2022-11-25 DIAGNOSIS — I442 Atrioventricular block, complete: Secondary | ICD-10-CM | POA: Diagnosis not present

## 2022-11-29 ENCOUNTER — Other Ambulatory Visit: Payer: Self-pay | Admitting: Nurse Practitioner

## 2022-11-29 DIAGNOSIS — E1165 Type 2 diabetes mellitus with hyperglycemia: Secondary | ICD-10-CM

## 2022-11-30 ENCOUNTER — Ambulatory Visit (INDEPENDENT_AMBULATORY_CARE_PROVIDER_SITE_OTHER): Payer: Medicare Other | Admitting: Nurse Practitioner

## 2022-11-30 ENCOUNTER — Encounter: Payer: Self-pay | Admitting: Nurse Practitioner

## 2022-11-30 VITALS — BP 110/59 | HR 98 | Temp 97.6°F | Resp 16 | Ht 59.0 in | Wt 128.0 lb

## 2022-11-30 DIAGNOSIS — J441 Chronic obstructive pulmonary disease with (acute) exacerbation: Secondary | ICD-10-CM

## 2022-11-30 DIAGNOSIS — M25511 Pain in right shoulder: Secondary | ICD-10-CM | POA: Diagnosis not present

## 2022-11-30 DIAGNOSIS — Z79899 Other long term (current) drug therapy: Secondary | ICD-10-CM | POA: Diagnosis not present

## 2022-11-30 DIAGNOSIS — Z09 Encounter for follow-up examination after completed treatment for conditions other than malignant neoplasm: Secondary | ICD-10-CM

## 2022-11-30 DIAGNOSIS — G8929 Other chronic pain: Secondary | ICD-10-CM

## 2022-11-30 MED ORDER — IPRATROPIUM-ALBUTEROL 0.5-2.5 (3) MG/3ML IN SOLN
3.0000 mL | RESPIRATORY_TRACT | 3 refills | Status: DC | PRN
Start: 2022-11-30 — End: 2023-06-28

## 2022-11-30 MED ORDER — CYCLOBENZAPRINE HCL 10 MG PO TABS
10.0000 mg | ORAL_TABLET | Freq: Every day | ORAL | 3 refills | Status: DC
Start: 2022-11-30 — End: 2023-08-12

## 2022-11-30 MED ORDER — ALBUTEROL SULFATE HFA 108 (90 BASE) MCG/ACT IN AERS
INHALATION_SPRAY | RESPIRATORY_TRACT | 11 refills | Status: DC
Start: 2022-11-30 — End: 2023-04-22

## 2022-11-30 MED ORDER — POTASSIUM CHLORIDE ER 10 MEQ PO TBCR
10.0000 meq | EXTENDED_RELEASE_TABLET | ORAL | 2 refills | Status: DC
Start: 2022-11-30 — End: 2023-12-14

## 2022-11-30 MED ORDER — CLOPIDOGREL BISULFATE 75 MG PO TABS: 75.0000 mg | ORAL_TABLET | Freq: Every day | ORAL | 3 refills | Status: DC

## 2022-11-30 MED ORDER — TRAMADOL HCL 50 MG PO TABS
50.0000 mg | ORAL_TABLET | Freq: Two times a day (BID) | ORAL | 0 refills | Status: DC | PRN
Start: 2022-11-30 — End: 2023-04-22

## 2022-11-30 NOTE — Progress Notes (Signed)
[facility] [place], [place] [address] [place] [address] [phone]                                   Transitional Care Clinic   [facility] Discharge Acute Issues Care Follow Up                                                                        Patient Demographics  Angela Jensen, is a 67 y.o. female  DOB 02-15-1956  MRN [id number].  Primary MD  Sallyanne Kuster, NP  Admit date:     11/18/2022  Discharge date: 11/20/22    Reason for TCC follow Up - COPD exacerbation    Past Medical History:  Diagnosis Date   Anemia    Arthritis    Asthma    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   Cancer 2012   Right Lung CA   COPD (chronic obstructive pulmonary disease)    Depression    Diabetes mellitus without complication    Patient takes Janumet   Essential hypertension 05/26/2016   Heart failure 2022   Hypercholesteremia    Oxygen dependent    2L at nite    PAD (peripheral artery disease) 06/22/2016   Peripheral vascular disease    Personal history of radiation therapy    Shortness of breath dyspnea    with exertion    Sialolithiasis    Sleep apnea    Wears dentures    full upper and lower    Past Surgical History:  Procedure Laterality Date   CESAREAN SECTION     x3   COLONOSCOPY WITH PROPOFOL N/A 06/25/2015   Procedure: COLONOSCOPY WITH PROPOFOL;  Surgeon: Midge Minium, MD;  Location: [facility] ENDOSCOPY;  Service: Endoscopy;  Laterality: N/A;   COLONOSCOPY WITH PROPOFOL N/A 07/26/2020   Procedure: COLONOSCOPY WITH PROPOFOL;  Surgeon: Midge Minium, MD;  Location: [facility];  Service: Endoscopy;  Laterality: N/A;   CYST REMOVAL LEG     and on shoulder    ESOPHAGOGASTRODUODENOSCOPY (EGD) WITH PROPOFOL N/A 07/26/2020   Procedure: ESOPHAGOGASTRODUODENOSCOPY (EGD) WITH PROPOFOL;  Surgeon: Midge Minium, MD;  Location: [facility];  Service: Endoscopy;  Laterality: N/A;   Diabetic - oral meds   LOWER EXTREMITY ANGIOGRAPHY Left 09/29/2018   Procedure: LOWER EXTREMITY ANGIOGRAPHY;  Surgeon: Annice Needy, MD;  Location: [facility] INVASIVE CV LAB;  Service: Cardiovascular;  Laterality: Left;   LUNG BIOPSY  12/30/2011   has lung "spots"   PACEMAKER IMPLANT  07/14/2021   PACEMAKER LEADLESS INSERTION N/A 07/14/2021   Procedure: PACEMAKER LEADLESS INSERTION;  Surgeon: Marcina Millard, MD;  Location: [facility] INVAS[facility] CV LAB;  Service: Cardiovascular;  Laterality: N/A;   PERIPHERAL VASCULAR CATHETERIZATION Left 06/01/2016   Procedure: Lower Extremity Angiography;  Surgeon: Annice Needy, MD;  Location: [facility] INVAS[facility] CV LAB;  Service: Cardiovascular;  Laterality: Left;   PERIPHERAL VASCULAR CATHETERIZATION N/A 06/01/2016   Procedure: Abdominal Aortogram w/Lower Extremity;  Surgeon: Annice Needy, MD;  Location: [facility] IN[facility] CV LAB;  Service: Cardiovascular;  Laterality: N/A;   PERIPHERAL VASCULAR CATHETERIZATION  06/01/2016   Procedure: Lower  Extremity Intervention;  Surgeon: Annice Needy, MD;  Location: [facility] INVASIVE CV LAB;  Service: Cardiovascular;;   PERIPHERAL VASCULAR CATHETERIZATION Right 06/08/2016   Procedure: Lower Extremity Angiography;  Surgeon: Annice Needy, MD;  Location: [facility] INV[facility] CV [facility];  Service: Cardiovascular;  Laterality: Right;   PERIPHERAL VASCULAR CATHETERIZATION  06/08/2016   Procedure: Lower Extremity Intervention;  Surgeon: Annice Needy, MD;  Location: [facility] INV[facility] CV [facility];  Service: Cardiovascular;;   SUBMANDIBULAR GLAND EXCISION Left 12/06/2020   Procedure: EXCISION SUBMANDIBULAR GLAND;  Surgeon: Linus Salmons, MD;  Location: [facility];  Service: ENT;  Laterality: Left;  needs to be first case Diabetic - diet controlled   TEMPORARY PACEMAKER N/A 07/11/2021   Procedure: TEMPORARY PACEMAKER;  Surgeon: Marcina Millard, MD;  Location: [facility] INVASIVE CV LAB;  Service: Cardiovascular;  Laterality: N/A;       Recent HPI and Hospital  Course  Hospital Course: Angela Jensen is a 67 y.o. African-American female with medical history significant for asthma, COPD, depression, type diabetes mellitus, essential hypertension, CHF, chronic respiratory failure on home O2 at 2 to 3 L/min, PAD and OSA, who presented to the ER with acute onset of worsening dyspnea with associated cough and wheezing.  Chest x-ray did not show any pneumonia.  She is diagnosed with COPD exacerbation, started on steroids.   Assessment and Plan:    COPD exacerbation Chronic hypoxemic respite failure Acute respite failure ruled out. Patient does not seem to have any pneumonia, discontinue antibiotics.  She is on 2 to 3 L oxygen at home, still at baseline.   Her condition appears to be secondary to allergy with exposure to smoke.  She is treated with IV steroids, transition oral steroids.  Due to clinical rising glucose, prednisone will decrease to 20 mg daily, will complete 6 days of tapering. Patient is medically stable to be discharged, she is advised to follow-up with PCP in 1 week.     Chronic diastolic congestive heart failure. Appears euvolemic   Type 2 diabetes mellitus with peripheral neuropathy Uncontrolled type 2 diabetes with hyperglycemia. Glucose running high due to steroid use.  Quickly taper down steroids, follow-up with PCP as outpatient and resume home dose insulin.   GERD without esophagitis PPI   Anxiety and depression Xanax and Lexapro.   Essential hypertension Resume home medicines.    Post Hospital Acute Care Issue to be followed in the Clinic   Principal Problem:   COPD exacerbation Active Problems:   Essential hypertension   Chronic hypoxemic respiratory failure   Anxiety and depression   GERD without esophagitis   Uncontrolled type 2 diabetes mellitus with hyperglycemia, with long-term current use of insulin   Subjective:   Angela Jensen today has, No headache, No chest pain, No abdominal pain - No Nausea, No new  weakness tingling or numbness, No Cough - SOB. On 3 LPM continuous oxygen   Assessment & Plan   1. Hospital discharge follow-up Treated in hospital for COPD exacerbation, doing better, on 3LPM continuous oxygen portable.  - ipratropium-albuterol (DUONEB) 0.5-2.5 (3) MG/3ML SOLN; Take 3 mLs by nebulization every 4 (four) hours as needed.  Dispense: 360 mL; Refill: 3  2. Chronic obstructive pulmonary disease with acute exacerbation Refills ordered. Continue prn albuterol and duoneb treatments as prescribed.  - albuterol (VENTOLIN HFA) 108 (90 Base) MCG/ACT inhaler; INHALE 2 PUFFS BY MOUTH EVERY 6 HOURS AS NEEDED FOR WHEEZING OR SHORTNESS OF BREATH  Dispense: 9 g; Refill: 11 - ipratropium-albuterol (DUONEB) 0.5-2.5 (  3) MG/3ML SOLN; Take 3 mLs by nebulization every 4 (four) hours as needed.  Dispense: 360 mL; Refill: 3  3. Chronic right shoulder pain Continue prn cyclobenzaprine as prescribed.  - cyclobenzaprine (FLEXERIL) 10 MG tablet; Take 1 tablet (10 mg total) by mouth at bedtime. Take one tab po qhs for back spasm prn only  Dispense: 30 tablet; Refill: 3  4. Encounter for medication review Medication list reviewed and refills ordered if needed. Continue medications as prescribed - clopidogrel (PLAVIX) 75 MG tablet; Take 1 tablet (75 mg total) by mouth daily.  Dispense: 90 tablet; Refill: 3 - traMADol (ULTRAM) 50 MG tablet; Take 1 tablet (50 mg total) by mouth every 12 (twelve) hours as needed.  Dispense: 90 tablet; Refill: 0 - potassium chloride (KLOR-CON) 10 MEQ tablet; Take 1 tablet (10 mEq total) by mouth every other day.  Dispense: 45 tablet; Refill: 2   Reason for frequent admissions/ER visits   COPD Hypertension Diabetes GERd Respiratory failure   Objective:   Vitals:   11/30/22 1451  BP: (!) 110/59  Pulse: 98  Resp: 16  Temp: 97.6 F (36.4 C)  SpO2: 97%  Weight: 128 lb (58.1 kg)  Height:  (1.499 m)    Wt Readings from Last 3 Encounters:  11/30/22 128 lb  (58.1 kg)  11/19/22 134 lb 4.2 oz (60.9 kg)  10/08/22 126 lb 12.8 oz (57.5 kg)    Allergies as of 11/30/2022       Reactions   Trelegy Ellipta [fluticasone-umeclidin-vilant]    Had breathing issues        Medication List        Accurate as of November 30, 2022  3:34 PM. If you have any questions, ask your nurse or doctor.          Accu-Chek Guide test strip Generic drug: glucose blood USE STRIP TO CHECK BLOOD SUGAR FIRST THING IN THE MORNING, BEFORE MEALS AND AT BEDTIME. (A TOTAL OF 5 TIMES PER DAY)   Accu-Chek Guide w/Device Kit Use as directed Dx e11.65   Accu-Chek Softclix Lancets lancets Use as instructed for twice a daily Dx E11.65   albuterol 108 (90 Base) MCG/ACT inhaler Commonly known as: VENTOLIN HFA INHALE 2 PUFFS BY MOUTH EVERY 6 HOURS AS NEEDED FOR WHEEZING OR SHORTNESS OF BREATH What changed: Another medication with the same name was removed. Continue taking this medication, and follow the directions you see here. Changed by: Sallyanne Kuster, NP   ALPRAZolam 0.25 MG tablet Commonly known as: XANAX TAKE 1 TABLET BY MOUTH THREE TIMES DAILY AS NEEDED FOR ANXIETY   calcium-vitamin D 500-5 MG-MCG tablet Commonly known as: OSCAL WITH D Take 1 tablet by mouth 2 (two) times daily.   clopidogrel 75 MG tablet Commonly known as: PLAVIX Take 1 tablet (75 mg total) by mouth daily.   cyclobenzaprine 10 MG tablet Commonly known as: FLEXERIL Take 1 tablet (10 mg total) by mouth at bedtime. Take one tab po qhs for back spasm prn only   Dexcom G7 Receiver Devi Use one as directed for uncontrolled dm   Dexcom G7 Sensor Misc Apply 1 sensor to skin on designated areas every 10 days to monitor glucose levels with sensor or smart phone app   diclofenac 75 MG EC tablet Commonly known as: VOLTAREN Take 1 tablet (75 mg total) by mouth 2 (two) times daily.   escitalopram 10 MG tablet Commonly known as: LEXAPRO Take 1 tablet (10 mg total) by mouth daily.  famotidine 20 MG tablet Commonly known as: PEPCID Take 1 tablet (20 mg total) by mouth daily for 14 days.   ferrous sulfate 325 (65 FE) MG tablet Take 325 mg by mouth 2 (two) times daily with a meal.   Fish Oil 1000 MG Caps Take 1 capsule by mouth daily.   fluticasone 50 MCG/ACT nasal spray Commonly known as: FLONASE Place 2 sprays into both nostrils daily.   furosemide 20 MG tablet Commonly known as: LASIX Take 1 tablet (20 mg total) by mouth daily as needed.   gabapentin 300 MG capsule Commonly known as: NEURONTIN Take 1 capsule (300 mg total) by mouth 3 (three) times daily.   ipratropium-albuterol 0.5-2.5 (3) MG/3ML Soln Commonly known as: DUONEB Take 3 mLs by nebulization every 4 (four) hours as needed. Started by: Sallyanne Kuster, NP   Levemir FlexTouch 100 UNIT/ML FlexPen Generic drug: insulin detemir Inject 15 Units into the skin daily with supper.   lisinopril-hydrochlorothiazide 20-25 MG tablet Commonly known as: ZESTORETIC Take 1 tablet by mouth daily.   metFORMIN 500 MG tablet Commonly known as: GLUCOPHAGE TAKE 2 TABLETS BY MOUTH ONCE DAILY WITH BREAKFAST   metoprolol tartrate 25 MG tablet Commonly known as: LOPRESSOR Take 1/2 (one-half) tablet by mouth twice daily   mometasone-formoterol 200-5 MCG/ACT Aero Commonly known as: DULERA Inhale 2 puffs into the lungs 2 (two) times daily.   OXYGEN Inhale 4 L into the lungs. PT USES [employer] FOR OXYGEN   potassium chloride 10 MEQ tablet Commonly known as: KLOR-CON Take 1 tablet (10 mEq total) by mouth every other day.   ReliOn Pen Needles 31G X 6 MM Misc Generic drug: Insulin Pen Needle USE 1  ONCE DAILY AS DIRECTED   Spiriva HandiHaler 18 MCG inhalation capsule Generic drug: tiotropium INHALE THE CONTENTS OF ONE CAPSULE BY MOUTH ONCE DAILY. DO NOT SWALLOW CAPSULES.   traMADol 50 MG tablet Commonly known as: ULTRAM Take 1 tablet (50 mg total) by mouth every 12 (twelve) hours as needed.          Physical Exam: Constitutional: Patient appears well-developed and well-nourished. Not in obvious distress. HENT: Normocephalic, atraumatic, External right and left ear normal. Oropharynx is clear and moist.  Eyes: Conjunctivae and EOM are normal. PERRLA, no scleral icterus. Neck: Normal ROM. Neck supple. No JVD. No tracheal deviation. No thyromegaly. CVS: RRR, S1/S2 +, no murmurs, no gallops, no carotid bruit.  Pulmonary: Effort and breath sounds normal, no stridor, rhonchi, wheezes, rales.  Abdominal: Soft. BS +, no distension, tenderness, rebound or guarding.  Musculoskeletal: Normal range of motion. No edema and no tenderness.  Lymphadenopathy: No lymphadenopathy noted, cervical, inguinal or axillary Neuro: Alert. Normal reflexes, muscle tone coordination. No cranial nerve deficit. Skin: Skin is warm and dry. No rash noted. Not diaphoretic. No erythema. No pallor. Psychiatric: Normal mood and affect. Behavior, judgment, thought content normal.   Data Review   Micro Results No results found for this or any previous visit (from the past 240 hour(s)).   CBC No results for input(s): "WBC", "HGB", "HCT", "PLT", "MCV", "MCH", "MCHC", "RDW", "LYMPHSABS", "MONOABS", "EOSABS", "BASOSABS", "BANDABS" in the last 168 hours.  Invalid input(s): "NEUTRABS", "BANDSABD"  Chemistries  No results for input(s): "NA", "K", "CL", "CO2", "GLUCOSE", "BUN", "CREATININE", "CALCIUM", "MG", "AST", "ALT", "ALKPHOS", "BILITOT" in the last 168 hours.  Invalid input(s): "GFRCGP" ------------------------------------------------------------------------------------------------------------------ estimated creatinine clearance is 53 mL/min (by C-G formula based on SCr of 0.66 mg/dL). ------------------------------------------------------------------------------------------------------------------ No results for input(s): "HGBA1C" in the last 72  hours. ------------------------------------------------------------------------------------------------------------------ No results for input(s): "CHOL", "HDL", "LDLCALC", "TRIG", "CHOLHDL", "LDLDIRECT" in the last 72 hours. ------------------------------------------------------------------------------------------------------------------ No results for input(s): "TSH", "T4TOTAL", "T3FREE", "THYROIDAB" in the last 72 hours.  Invalid input(s): "FREET3" ------------------------------------------------------------------------------------------------------------------ No results for input(s): "VITAMINB12", "FOLATE", "FERRITIN", "TIBC", "IRON", "RETICCTPCT" in the last 72 hours.  Coagulation profile No results for input(s): "INR", "PROTIME" in the last 168 hours.  No results for input(s): "DDIMER" in the last 72 hours.  Cardiac Enzymes No results for input(s): "CKMB", "TROPONINI", "MYOGLOBIN" in the last 168 hours.  Invalid input(s): "CK" ------------------------------------------------------------------------------------------------------------------ Invalid input(s): "POCBNP"  Return for previously scheduled, F/U, Recheck A1C, Angela Jensen PCP on 12/08/22.   Time Spent in minutes  45 Time spent with patient included reviewing progress notes, labs, imaging studies, and discussing plan for follow up.   This patient was seen by Sallyanne Kuster, FNP-C in collaboration with Dr. Beverely Risen as a part of collaborative care agreement.    Sallyanne Kuster MSN, FNP-C on 11/30/2022 at 3:34 PM   **Disclaimer: This note may have been dictated with voice recognition software. Similar sounding words can inadvertently be transcribed and this note may contain transcription errors which may not have been corrected upon publication of note.**

## 2022-12-05 DIAGNOSIS — I10 Essential (primary) hypertension: Secondary | ICD-10-CM | POA: Diagnosis not present

## 2022-12-05 DIAGNOSIS — J449 Chronic obstructive pulmonary disease, unspecified: Secondary | ICD-10-CM | POA: Diagnosis not present

## 2022-12-05 DIAGNOSIS — R0602 Shortness of breath: Secondary | ICD-10-CM | POA: Diagnosis not present

## 2022-12-08 ENCOUNTER — Encounter: Payer: Self-pay | Admitting: Nurse Practitioner

## 2022-12-08 ENCOUNTER — Ambulatory Visit (INDEPENDENT_AMBULATORY_CARE_PROVIDER_SITE_OTHER): Payer: Medicare Other | Admitting: Nurse Practitioner

## 2022-12-08 VITALS — BP 114/62 | HR 91 | Temp 98.6°F | Resp 16 | Ht 59.0 in | Wt 129.0 lb

## 2022-12-08 DIAGNOSIS — F419 Anxiety disorder, unspecified: Secondary | ICD-10-CM | POA: Diagnosis not present

## 2022-12-08 DIAGNOSIS — Z760 Encounter for issue of repeat prescription: Secondary | ICD-10-CM

## 2022-12-08 DIAGNOSIS — F32A Depression, unspecified: Secondary | ICD-10-CM

## 2022-12-08 DIAGNOSIS — I10 Essential (primary) hypertension: Secondary | ICD-10-CM | POA: Diagnosis not present

## 2022-12-08 DIAGNOSIS — E1165 Type 2 diabetes mellitus with hyperglycemia: Secondary | ICD-10-CM

## 2022-12-08 LAB — POCT GLYCOSYLATED HEMOGLOBIN (HGB A1C): Hemoglobin A1C: 6.3 % — AB (ref 4.0–5.6)

## 2022-12-08 MED ORDER — METFORMIN HCL 500 MG PO TABS: ORAL_TABLET | ORAL | 0 refills | Status: DC

## 2022-12-08 MED ORDER — ESCITALOPRAM OXALATE 10 MG PO TABS: 10.0000 mg | ORAL_TABLET | Freq: Every day | ORAL | 1 refills | Status: DC

## 2022-12-08 NOTE — Progress Notes (Signed)
[facility] [address] [place], [place] [address]  Internal MEDICINE  Office Visit Note  Patient Name: Angela Jensen  [id number]  [id number]  Date of Service: 12/08/2022  Chief Complaint  Patient presents with   Depression   Diabetes   Hypertension   Hyperlipidemia    HPI Angela Jensen presents for a follow-up visit for diabetes, hypertension, depression. Diabetes -- A1c improved to 6.3. takes metformin. Hypertension -- controlled with current medications Anxiety and depression -- takes escitalopram, doing well.    Current Medication: Outpatient Encounter Medications as of 12/08/2022  Medication Sig   Accu-Chek Softclix Lancets lancets Use as instructed for twice a daily Dx E11.65   albuterol (VENTOLIN HFA) 108 (90 Base) MCG/ACT inhaler INHALE 2 PUFFS BY MOUTH EVERY 6 HOURS AS NEEDED FOR WHEEZING OR SHORTNESS OF BREATH   Blood Glucose Monitoring Suppl (ACCU-CHEK GUIDE) w/Device KIT Use as directed Dx e11.65   calcium-vitamin D (OSCAL WITH D) 500-5 MG-MCG tablet Take 1 tablet by mouth 2 (two) times daily.   clopidogrel (PLAVIX) 75 MG tablet Take 1 tablet (75 mg total) by mouth daily.   Continuous Blood Gluc Receiver (DEXCOM G7 RECEIVER) DEVI Use one as directed for uncontrolled dm   Continuous Blood Gluc Sensor (DEXCOM G7 SENSOR) MISC Apply 1 sensor to skin on designated areas every 10 days to monitor glucose levels with sensor or smart phone app   cyclobenzaprine (FLEXERIL) 10 MG tablet Take 1 tablet (10 mg total) by mouth at bedtime. Take one tab po qhs for back spasm prn only   diclofenac (VOLTAREN) 75 MG EC tablet Take 1 tablet (75 mg total) by mouth 2 (two) times daily.   ferrous sulfate 325 (65 FE) MG tablet Take 325 mg by mouth 2 (two) times daily with a meal.   fluticasone (FLONASE) 50 MCG/ACT nasal spray Place 2 sprays into both nostrils daily.   furosemide (LASIX) 20 MG tablet Take 1 tablet (20 mg total) by mouth daily as needed.   gabapentin (NEURONTIN) 300 MG  capsule Take 1 capsule (300 mg total) by mouth 3 (three) times daily.   glucose blood (ACCU-CHEK GUIDE) test strip USE STRIP TO CHECK BLOOD SUGAR FIRST THING IN THE MORNING, BEFORE MEALS AND AT BEDTIME. (A TOTAL OF 5 TIMES PER DAY)   insulin detemir (LEVEMIR FLEXTOUCH) 100 UNIT/ML FlexPen Inject 15 Units into the skin daily with supper.   ipratropium-albuterol (DUONEB) 0.5-2.5 (3) MG/3ML SOLN Take 3 mLs by nebulization every 4 (four) hours as needed.   lisinopril-hydrochlorothiazide (ZESTORETIC) 20-25 MG tablet Take 1 tablet by mouth daily.   metoprolol tartrate (LOPRESSOR) 25 MG tablet Take 1/2 (one-half) tablet by mouth twice daily   mometasone-formoterol (DULERA) 200-5 MCG/ACT AERO Inhale 2 puffs into the lungs 2 (two) times daily.   Omega-3 Fatty Acids (FISH OIL) 1000 MG CAPS Take 1 capsule by mouth daily.   OXYGEN Inhale 4 L into the lungs. PT USES [facility] FOR OXYGEN   potassium chloride (KLOR-CON) 10 MEQ tablet Take 1 tablet (10 mEq total) by mouth every other day.   RELION PEN NEEDLES 31G X 6 MM MISC USE 1  ONCE DAILY AS DIRECTED   SPIRIVA HANDIHALER 18 MCG inhalation capsule INHALE THE CONTENTS OF ONE CAPSULE BY MOUTH ONCE DAILY. DO NOT SWALLOW CAPSULES.   traMADol (ULTRAM) 50 MG tablet Take 1 tablet (50 mg total) by mouth every 12 (twelve) hours as needed.   [DISCONTINUED] ALPRAZolam (XANAX) 0.25 MG tablet TAKE 1 TABLET BY MOUTH THREE TIMES  DAILY AS NEEDED FOR ANXIETY   [DISCONTINUED] escitalopram (LEXAPRO) 10 MG tablet Take 1 tablet (10 mg total) by mouth daily.   [DISCONTINUED] metFORMIN (GLUCOPHAGE) 500 MG tablet TAKE 2 TABLETS BY MOUTH ONCE DAILY WITH BREAKFAST   escitalopram (LEXAPRO) 10 MG tablet Take 1 tablet (10 mg total) by mouth daily.   famotidine (PEPCID) 20 MG tablet Take 1 tablet (20 mg total) by mouth daily for 14 days.   metFORMIN (GLUCOPHAGE) 500 MG tablet TAKE 2 TABLETS BY MOUTH ONCE DAILY WITH BREAKFAST   No facility-administered encounter medications on file as  of 12/08/2022.    Surgical History: Past Surgical History:  Procedure Laterality Date   CESAREAN SECTION     x3   COLONOSCOPY WITH PROPOFOL N/A 06/25/2015   Procedure: COLONOSCOPY WITH PROPOFOL;  Surgeon: Midge Minium, MD;  Location: [facility];  Service: Endoscopy;  Laterality: N/A;   COLONOSCOPY WITH PROPOFOL N/A 07/26/2020   Procedure: COLONOSCOPY WITH PROPOFOL;  Surgeon: Midge Minium, MD;  Location: [facility];  Service: Endoscopy;  Laterality: N/A;   CYST REMOVAL LEG     and on shoulder    ESOPHAGOGASTRODUODENOSCOPY (EGD) WITH PROPOFOL N/A 07/26/2020   Procedure: ESOPHAGOGASTRODUODENOSCOPY (EGD) WITH PROPOFOL;  Surgeon: Midge Minium, MD;  Location: [facility];  Service: Endoscopy;  Laterality: N/A;  Diabetic - oral meds   LOWER EXTREMITY ANGIOGRAPHY Left 09/29/2018   Procedure: LOWER EXTREMITY ANGIOGRAPHY;  Surgeon: Annice Needy, MD;  Location: [facility];  Service: Cardiovascular;  Laterality: Left;   LUNG BIOPSY  12/30/2011   has lung "spots"   PACEMAKER IMPLANT  07/14/2021   PACEMAKER LEADLESS INSERTION N/A 07/14/2021   Procedure: PACEMAKER LEADLESS INSERTION;  Surgeon: Marcina Millard, MD;  Location: [facility];  Service: Cardiovascular;  Laterality: N/A;   PERIPHERAL VASCULAR CATHETERIZATION Left 06/01/2016   Procedure: Lower Extremity Angiography;  Surgeon: Annice Needy, MD;  Location: [facility];  Service: Cardiovascular;  Laterality: Left;   PERIPHERAL VASCULAR CATHETERIZATION N/A 06/01/2016   Procedure: Abdominal Aortogram w/Lower Extremity;  Surgeon: Annice Needy, MD;  Location: [facility];  Service: Cardiovascular;  Laterality: N/A;   PERIPHERAL VASCULAR CATHETERIZATION  06/01/2016   Procedure: Lower Extremity Intervention;  Surgeon: Annice Needy, MD;  Location: [facility];  Service: Cardiovascular;;   PERIPHERAL VASCULAR CATHETERIZATION Right 06/08/2016   Procedure: Lower Extremity Angiography;   Surgeon: Annice Needy, MD;  Location: [facility];  Service: Cardiovascular;  Laterality: Right;   PERIPHERAL VASCULAR CATHETERIZATION  06/08/2016   Procedure: Lower Extremity Intervention;  Surgeon: Annice Needy, MD;  Location: [facility];  Service: Cardiovascular;;   SUBMANDIBULAR GLAND EXCISION Left 12/06/2020   Procedure: EXCISION SUBMANDIBULAR GLAND;  Surgeon: Linus Salmons, MD;  Location: [facility];  Service: ENT;  Laterality: Left;  needs to be first case Diabetic - diet controlled   TEMPORARY PACEMAKER N/A 07/11/2021   Procedure: TEMPORARY PACEMAKER;  Surgeon: Marcina Millard, MD;  Location: [facility];  Service: Cardiovascular;  Laterality: N/A;    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/2016   Cancer (HCC) 2012   Right Lung CA   COPD (chronic obstructive pulmonary disease) (HCC)    Depression    Diabetes mellitus without complication (HCC)    Patient takes Janumet   Essential  hypertension 05/26/2016   Heart failure (HCC) 2022   Hypercholesteremia    Oxygen dependent    2L at nite    PAD (peripheral artery disease) (HCC) 06/22/2016   Peripheral vascular disease (HCC)    Personal history of radiation therapy    Shortness of breath dyspnea    with exertion    Sialolithiasis    Sleep apnea    Wears dentures    full upper and lower    Family History: Family History  Problem Relation Age of Onset   Diabetes Mother    Hypercholesterolemia Mother    Lung cancer Father    Diabetes Sister    Diabetes Sister    Hypertension Sister    Diabetes Maternal Grandmother    Diabetes Paternal Grandmother    Heart attack Brother    Coronary artery disease Brother    Vascular Disease Brother    Heart attack Brother    Breast cancer Neg Hx     Social History   Socioeconomic History    Marital status: Widowed    Spouse name: Not on file   Number of children: Not on file   Years of education: Not on file   Highest education level: Not on file  Occupational History   Not on file  Tobacco Use   Smoking status: Former    Packs/day: 1.00    Years: 37.00    Additional pack years: 0.00    Total pack years: 37.00    Types: Cigarettes    Quit date: 02/06/2010    Years since quitting: 12.8   Smokeless tobacco: Former    Types: Snuff  Vaping Use   Vaping Use: Never used  Substance and Sexual Activity   Alcohol use: Not Currently    Alcohol/week: 5.0 standard drinks of alcohol    Types: 5 Cans of beer per week    Comment: /h x of alcohol abuse -stopped 2012- now drinks 5 beer per week   Drug use: Not Currently    Types: Marijuana, "Crack" cocaine, Cocaine    Comment: hx of cocaine use- last use 2015; last use marijuana6/22/19,   Sexual activity: Yes  Other Topics Concern   Not on file  Social History Narrative   Lives with Significant Other x 43 years   Social Determinants of Health   Financial Resource Strain: Not on file  Food Insecurity: No Food Insecurity (11/19/2022)   Hunger Vital Sign    Worried About Running Out of Food in the Last Year: Never true    Ran Out of Food in the Last Year: Never true  Transportation Needs: No Transportation Needs (11/19/2022)   PRAPARE - Administrator, Civil Service (Medical): No    Lack of Transportation (Non-Medical): No  Physical Activity: Not on file  Stress: Not on file  Social Connections: Not on file  Intimate Partner Violence: Not At Risk (11/19/2022)   Humiliation, Afraid, Rape, and Kick questionnaire    Fear of Current or Ex-Partner: No    Emotionally Abused: No    Physically Abused: No    Sexually Abused: No      Review of Systems  Constitutional:  Negative for chills, fatigue and unexpected weight change.  HENT:  Negative for congestion, rhinorrhea, sneezing and sore throat.   Eyes:  Negative for  redness.  Respiratory:  Negative for cough, chest tightness and shortness of breath.   Cardiovascular:  Negative for chest pain and palpitations.  Gastrointestinal:  Positive for abdominal  pain. Negative for constipation, diarrhea, nausea and vomiting.  Genitourinary:  Negative for dysuria and frequency.  Musculoskeletal:  Positive for arthralgias and back pain. Negative for joint swelling and neck pain.  Skin:  Negative for rash.  Neurological: Negative.  Negative for tremors and numbness.  Hematological:  Negative for adenopathy. Does not bruise/bleed easily.  Psychiatric/Behavioral:  Negative for behavioral problems (Depression), sleep disturbance and suicidal ideas. The patient is not nervous/anxious.     Vital Signs: BP 114/62   Pulse 91   Temp 98.6 F (37 C)   Resp 16   Ht 4\' 11"  (1.499 m)   Wt 129 lb (58.5 kg)   SpO2 93% Comment: 2L  BMI 26.05 kg/m    Physical Exam Vitals reviewed.  Constitutional:      General: She is not in acute distress.    Appearance: Normal appearance. She is normal weight. She is not ill-appearing.  HENT:     Head: Normocephalic and atraumatic.  Eyes:     Pupils: Pupils are equal, round, and reactive to light.  Cardiovascular:     Rate and Rhythm: Normal rate and regular rhythm.  Pulmonary:     Effort: Pulmonary effort is normal. No respiratory distress.  Neurological:     Mental Status: She is alert and oriented to person, place, and time.  Psychiatric:        Mood and Affect: Mood normal.        Behavior: Behavior normal.        Assessment/Plan: 1. Uncontrolled type 2 diabetes mellitus with hyperglycemia (HCC) Stable, Continue metformin as prescribed - POCT glycosylated hemoglobin (Hb A1C) - metFORMIN (GLUCOPHAGE) 500 MG tablet; TAKE 2 TABLETS BY MOUTH ONCE DAILY WITH BREAKFAST  Dispense: 180 tablet; Refill: 0  2. Essential hypertension Continue current medications as prescribed.   3. Anxiety and depression Continue  escitalopram as prescribed.  - escitalopram (LEXAPRO) 10 MG tablet; Take 1 tablet (10 mg total) by mouth daily.  Dispense: 90 tablet; Refill: 1   General Counseling: Angela Jensen verbalizes understanding of the findings of todays visit and agrees with plan of treatment. I have discussed any further diagnostic evaluation that may be needed or ordered today. We also reviewed her medications today. she has been encouraged to call the office with any questions or concerns that should arise related to todays visit.    Orders Placed This Encounter  Procedures   POCT glycosylated hemoglobin (Hb A1C)    Meds ordered this encounter  Medications   escitalopram (LEXAPRO) 10 MG tablet    Sig: Take 1 tablet (10 mg total) by mouth daily.    Dispense:  90 tablet    Refill:  1   metFORMIN (GLUCOPHAGE) 500 MG tablet    Sig: TAKE 2 TABLETS BY MOUTH ONCE DAILY WITH BREAKFAST    Dispense:  180 tablet    Refill:  0    Return in about 4 months (around 04/09/2023) for F/U, Recheck A1C, Salim Forero PCP.   Total time spent:30 Minutes Time spent includes review of chart, medications, test results, and follow up plan with the patient.   [place] Controlled Substance Database was reviewed by me.  This patient was seen by Sallyanne Kuster, FNP-C in collaboration with Dr. Beverely Risen as a part of collaborative care agreement.   Angela Acocella R. Tedd Sias, MSN, FNP-C Internal medicine

## 2022-12-16 ENCOUNTER — Other Ambulatory Visit: Payer: Self-pay | Admitting: Nurse Practitioner

## 2022-12-16 DIAGNOSIS — Z760 Encounter for issue of repeat prescription: Secondary | ICD-10-CM

## 2022-12-16 NOTE — Telephone Encounter (Signed)
Last 4/24

## 2022-12-19 ENCOUNTER — Encounter: Payer: Self-pay | Admitting: Nurse Practitioner

## 2022-12-23 ENCOUNTER — Other Ambulatory Visit: Payer: Self-pay | Admitting: Nurse Practitioner

## 2022-12-23 DIAGNOSIS — J449 Chronic obstructive pulmonary disease, unspecified: Secondary | ICD-10-CM

## 2022-12-24 ENCOUNTER — Encounter: Payer: Self-pay | Admitting: Hematology and Oncology

## 2022-12-30 ENCOUNTER — Encounter: Payer: Self-pay | Admitting: Pharmacist

## 2022-12-30 DIAGNOSIS — T466X5A Adverse effect of antihyperlipidemic and antiarteriosclerotic drugs, initial encounter: Secondary | ICD-10-CM

## 2022-12-30 NOTE — Progress Notes (Addendum)
[facility]) [facility] Quality Pharmacy Team Statin Quality Measure Assessment  12/30/2022  Angela Jensen May 31, 1956 [id number]  Per review of chart and payor information, [name] has a diagnosis of diabetes but is not currently filling a statin prescription.  This places patient into the Statin Use In Patients with Diabetes (SUPD) measure for CMS.    Patient has documented trials of statins with reported myopathy, but no corresponding CPT codes that would exclude patient from SUPD measure.   If clinically appropriate, please consider adding diagnosis code G72.0 to [facility] office visit to remove [name] from the 2024 measure.  Code for past statin intolerance or  other exclusions (required annually)  Provider Requirements: Associate code during an office visit or telehealth encounter  Drug Induced Myopathy G72.0   Myopathy, unspecified G72.9   Myositis, unspecified M60.9   Rhabdomyolysis M62.82   Cirrhosis of liver K74.69   Prediabetes R73.03   PCOS E28.2   Thank you for allowing [facility] pharmacy to be a part of this patient's care.  Dellie Burns, PharmD [facility]  [facility] Clinical Pharmacist Office: [phone]

## 2022-12-31 ENCOUNTER — Encounter: Payer: Self-pay | Admitting: Physician Assistant

## 2022-12-31 ENCOUNTER — Ambulatory Visit (INDEPENDENT_AMBULATORY_CARE_PROVIDER_SITE_OTHER): Payer: 59 | Admitting: Physician Assistant

## 2022-12-31 ENCOUNTER — Encounter: Payer: Self-pay | Admitting: Hematology and Oncology

## 2022-12-31 VITALS — BP 125/70 | HR 94 | Temp 97.6°F | Resp 16 | Ht 59.0 in | Wt 136.0 lb

## 2022-12-31 DIAGNOSIS — Z9981 Dependence on supplemental oxygen: Secondary | ICD-10-CM

## 2022-12-31 DIAGNOSIS — J449 Chronic obstructive pulmonary disease, unspecified: Secondary | ICD-10-CM

## 2022-12-31 DIAGNOSIS — J9611 Chronic respiratory failure with hypoxia: Secondary | ICD-10-CM

## 2022-12-31 MED ORDER — AZITHROMYCIN 250 MG PO TABS
ORAL_TABLET | ORAL | 0 refills | Status: DC
Start: 2022-12-31 — End: 2023-01-19

## 2022-12-31 MED ORDER — PREDNISONE 10 MG PO TABS
ORAL_TABLET | ORAL | 0 refills | Status: DC
Start: 2022-12-31 — End: 2023-04-22

## 2022-12-31 MED ORDER — SPIRIVA HANDIHALER 18 MCG IN CAPS: ORAL_CAPSULE | RESPIRATORY_TRACT | 3 refills | Status: DC

## 2022-12-31 NOTE — Progress Notes (Signed)
[facility] [address] [place], [place] [address]  Pulmonary Sleep Medicine   Office Visit Note  Patient Name: Angela Jensen DOB: 24-Jan-1956 MRN [id number]  Date of Service: 12/31/2022  Complaints/HPI: Pt is here for routine pulmonary follow up. Wars 3L oxygen continuously. Getting more SOB and wheezing recently for the past few days. Was hospitalized in early April for COPD exacerbation and will try prevent this and go ahead and treat early. Not much productive coughing right now. Does need spiriva refills  ROS  General: (-) fever, (-) chills, (-) night sweats, (-) weakness Skin: (-) rashes, (-) itching,. Eyes: (-) visual changes, (-) redness, (-) itching. Nose and Sinuses: (-) nasal stuffiness or itchiness, (-) postnasal drip, (-) nosebleeds, (-) sinus trouble. Mouth and Throat: (-) sore throat, (-) hoarseness. Neck: (-) swollen glands, (-) enlarged thyroid, (-) neck pain. Respiratory: + cough, (-) bloody sputum, + shortness of breath, + wheezing. Cardiovascular: - ankle swelling, (-) chest pain. Lymphatic: (-) lymph node enlargement. Neurologic: (-) numbness, (-) tingling. Psychiatric: (-) anxiety, (-) depression   Current Medication: Outpatient Encounter Medications as of 12/31/2022  Medication Sig   Accu-Chek Softclix Lancets lancets Use as instructed for twice a daily Dx E11.65   albuterol (VENTOLIN HFA) 108 (90 Base) MCG/ACT inhaler INHALE 2 PUFFS BY MOUTH EVERY 6 HOURS AS NEEDED FOR WHEEZING OR SHORTNESS OF BREATH   ALPRAZolam (XANAX) 0.25 MG tablet TAKE 1 TABLET BY MOUTH THREE TIMES DAILY AS NEEDED FOR ANXIETY   azithromycin (ZITHROMAX) 250 MG tablet Take one tab a day for 10 days for uri   Blood Glucose Monitoring Suppl (ACCU-CHEK GUIDE) w/Device KIT Use as directed Dx e11.65   calcium-vitamin D (OSCAL WITH D) 500-5 MG-MCG tablet Take 1 tablet by mouth 2 (two) times daily.   clopidogrel (PLAVIX) 75 MG tablet Take 1 tablet (75 mg total) by mouth daily.    Continuous Blood Gluc Receiver (DEXCOM G7 RECEIVER) DEVI Use one as directed for uncontrolled dm   Continuous Blood Gluc Sensor (DEXCOM G7 SENSOR) MISC Apply 1 sensor to skin on designated areas every 10 days to monitor glucose levels with sensor or smart phone app   cyclobenzaprine (FLEXERIL) 10 MG tablet Take 1 tablet (10 mg total) by mouth at bedtime. Take one tab po qhs for back spasm prn only   diclofenac (VOLTAREN) 75 MG EC tablet Take 1 tablet (75 mg total) by mouth 2 (two) times daily.   escitalopram (LEXAPRO) 10 MG tablet Take 1 tablet (10 mg total) by mouth daily.   ferrous sulfate 325 (65 FE) MG tablet Take 325 mg by mouth 2 (two) times daily with a meal.   fluticasone (FLONASE) 50 MCG/ACT nasal spray Place 2 sprays into both nostrils daily.   furosemide (LASIX) 20 MG tablet Take 1 tablet (20 mg total) by mouth daily as needed.   gabapentin (NEURONTIN) 300 MG capsule Take 1 capsule (300 mg total) by mouth 3 (three) times daily.   glucose blood (ACCU-CHEK GUIDE) test strip USE STRIP TO CHECK BLOOD SUGAR FIRST THING IN THE MORNING, BEFORE MEALS AND AT BEDTIME. (A TOTAL OF 5 TIMES PER DAY)   insulin detemir (LEVEMIR FLEXTOUCH) 100 UNIT/ML FlexPen Inject 15 Units into the skin daily with supper.   ipratropium-albuterol (DUONEB) 0.5-2.5 (3) MG/3ML SOLN Take 3 mLs by nebulization every 4 (four) hours as needed.   lisinopril-hydrochlorothiazide (ZESTORETIC) 20-25 MG tablet Take 1 tablet by mouth daily.   metFORMIN (GLUCOPHAGE) 500 MG tablet TAKE 2 TABLETS BY MOUTH  ONCE DAILY WITH BREAKFAST   metoprolol tartrate (LOPRESSOR) 25 MG tablet Take 1/2 (one-half) tablet by mouth twice daily   mometasone-formoterol (DULERA) 200-5 MCG/ACT AERO Inhale 2 puffs into the lungs 2 (two) times daily.   Omega-3 Fatty Acids (FISH OIL) 1000 MG CAPS Take 1 capsule by mouth daily.   OXYGEN Inhale 4 L into the lungs. PT USES [facility] FOR OXYGEN   potassium chloride (KLOR-CON) 10 MEQ tablet Take 1 tablet (10 mEq  total) by mouth every other day.   predniSONE (DELTASONE) 10 MG tablet Take one tab 3 x day for 3 days, then take one tab 2 x a day for 3 days and then take one tab a day for 3 days for copd   RELION PEN NEEDLES 31G X 6 MM MISC USE 1  ONCE DAILY AS DIRECTED   traMADol (ULTRAM) 50 MG tablet Take 1 tablet (50 mg total) by mouth every 12 (twelve) hours as needed.   [DISCONTINUED] SPIRIVA HANDIHALER 18 MCG inhalation capsule INHALE THE CONTENTS OF 1 CAPSULES BY MOUTH ONCE DAILY - DO NOT SWALLOW CAPSULES   famotidine (PEPCID) 20 MG tablet Take 1 tablet (20 mg total) by mouth daily for 14 days.   SPIRIVA HANDIHALER 18 MCG inhalation capsule INHALE THE CONTENTS OF 1 CAPSULES BY MOUTH ONCE DAILY - DO NOT SWALLOW CAPSULES   No facility-administered encounter medications on file as of 12/31/2022.    Surgical History: Past Surgical History:  Procedure Laterality Date   CESAREAN SECTION     x3   COLONOSCOPY WITH PROPOFOL N/A 06/25/2015   Procedure: COLONOSCOPY WITH PROPOFOL;  Surgeon: Midge Minium, MD;  Location: [facility];  Service: Endoscopy;  Laterality: N/A;   COLONOSCOPY WITH PROPOFOL N/A 07/26/2020   Procedure: COLONOSCOPY WITH PROPOFOL;  Surgeon: Midge Minium, MD;  Location: [facility];  Service: Endoscopy;  Laterality: N/A;   CYST REMOVAL LEG     and on shoulder    ESOPHAGOGASTRODUODENOSCOPY (EGD) WITH PROPOFOL N/A 07/26/2020   Procedure: ESOPHAGOGASTRODUODENOSCOPY (EGD) WITH PROPOFOL;  Surgeon: Midge Minium, MD;  Location: [facility];  Service: Endoscopy;  Laterality: N/A;  Diabetic - oral meds   LOWER EXTREMITY ANGIOGRAPHY Left 09/29/2018   Procedure: LOWER EXTREMITY ANGIOGRAPHY;  Surgeon: Annice Needy, MD;  Location: [facility];  Service: Cardiovascular;  Laterality: Left;   LUNG BIOPSY  12/30/2011   has lung "spots"   PACEMAKER IMPLANT  07/14/2021   PACEMAKER LEADLESS INSERTION N/A 07/14/2021   Procedure: PACEMAKER LEADLESS INSERTION;  Surgeon: Marcina Millard, MD;  Location: [facility];  Service: Cardiovascular;  Laterality: N/A;   PERIPHERAL VASCULAR CATHETERIZATION Left 06/01/2016   Procedure: Lower Extremity Angiography;  Surgeon: Annice Needy, MD;  Location: [facility];  Service: Cardiovascular;  Laterality: Left;   PERIPHERAL VASCULAR CATHETERIZATION N/A 06/01/2016   Procedure: Abdominal Aortogram w/Lower Extremity;  Surgeon: Annice Needy, MD;  Location: [facility];  Service: Cardiovascular;  Laterality: N/A;   PERIPHERAL VASCULAR CATHETERIZATION  06/01/2016   Procedure: Lower Extremity Intervention;  Surgeon: Annice Needy, MD;  Location: [facility];  Service: Cardiovascular;;   PERIPHERAL VASCULAR CATHETERIZATION Right 06/08/2016   Procedure: Lower Extremity Angiography;  Surgeon: Annice Needy, MD;  Location: [facility];  Service: Cardiovascular;  Laterality: Right;   PERIPHERAL VASCULAR CATHETERIZATION  06/08/2016   Procedure: Lower Extremity Intervention;  Surgeon: Annice Needy, MD;  Location: [facility];  Service: Cardiovascular;;  SUBMANDIBULAR GLAND EXCISION Left 12/06/2020   Procedure: EXCISION SUBMANDIBULAR GLAND;  Surgeon: Linus Salmons, MD;  Location: [facility];  Service: ENT;  Laterality: Left;  needs to be first case Diabetic - diet controlled   TEMPORARY PACEMAKER N/A 07/11/2021   Procedure: TEMPORARY PACEMAKER;  Surgeon: Marcina Millard, MD;  Location: [facility];  Service: Cardiovascular;  Laterality: N/A;    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/2016   Cancer (HCC) 2012   Right Lung CA   COPD (chronic obstructive pulmonary disease) (HCC)    Depression    Diabetes mellitus without complication (HCC)    Patient takes Janumet   Essential hypertension 05/26/2016   Heart failure (HCC) 2022    Hypercholesteremia    Oxygen dependent    2L at nite    PAD (peripheral artery disease) (HCC) 06/22/2016   Peripheral vascular disease (HCC)    Personal history of radiation therapy    Shortness of breath dyspnea    with exertion    Sialolithiasis    Sleep apnea    Wears dentures    full upper and lower    Family History: Family History  Problem Relation Age of Onset   Diabetes Mother    Hypercholesterolemia Mother    Lung cancer Father    Diabetes Sister    Diabetes Sister    Hypertension Sister    Diabetes Maternal Grandmother    Diabetes Paternal Grandmother    Heart attack Brother    Coronary artery disease Brother    Vascular Disease Brother    Heart attack Brother    Breast cancer Neg Hx     Social History: Social History   Socioeconomic History   Marital status: Widowed    Spouse name: Not on file   Number of children: Not on file   Years of education: Not on file   Highest education level: Not on file  Occupational History   Not on file  Tobacco Use   Smoking status: Former    Packs/day: 1.00    Years: 37.00    Additional pack years: 0.00    Total pack years: 37.00    Types: Cigarettes    Quit date: 02/06/2010    Years since quitting: 12.9   Smokeless tobacco: Former    Types: Snuff  Vaping Use   Vaping Use: Never used  Substance and Sexual Activity   Alcohol use: Not Currently    Alcohol/week: 5.0 standard drinks of alcohol    Types: 5 Cans of beer per week    Comment: /h x of alcohol abuse -stopped 2012- now drinks 5 beer per week   Drug use: Not Currently    Types: Marijuana, "Crack" cocaine, Cocaine    Comment: hx of cocaine use- last use 2015; last use marijuana6/22/19,   Sexual activity: Yes  Other Topics Concern   Not on file  Social History Narrative   Lives with Significant Other x 43 years   Social Determinants of Health   Financial Resource Strain: Not on file  Food Insecurity: No Food Insecurity (11/19/2022)   Hunger Vital  Sign    Worried About Running Out of Food in the Last Year: Never true    Ran Out of Food in the Last Year: Never true  Transportation Needs: No Transportation Needs (11/19/2022)   PRAPARE -  Administrator, Civil Service (Medical): No    Lack of Transportation (Non-Medical): No  Physical Activity: Not on file  Stress: Not on file  Social Connections: Not on file  Intimate Partner Violence: Not At Risk (11/19/2022)   Humiliation, Afraid, Rape, and Kick questionnaire    Fear of Current or Ex-Partner: No    Emotionally Abused: No    Physically Abused: No    Sexually Abused: No    Vital Signs: Blood pressure 125/70, pulse 94, temperature 97.6 F (36.4 C), resp. rate 16, height 4\' 11"  (1.499 m), weight 136 lb (61.7 kg), SpO2 92 %.  Examination: General Appearance: The patient is well-developed, well-nourished, and in no distress. Skin: Gross inspection of skin unremarkable. Head: normocephalic, no gross deformities. Eyes: no gross deformities noted. ENT: ears appear grossly normal no exudates. Neck: Supple. No thyromegaly. No LAD. Respiratory: Some wheezing. Cardiovascular: Normal S1 and S2 without murmur or rub. Extremities: No cyanosis. pulses are equal. Neurologic: Alert and oriented. No involuntary movements.  LABS: Recent Results (from the past 2160 hour(s))  Brain natriuretic peptide     Status: None   Collection Time: 11/18/22  9:34 PM  Result Value Ref Range   B Natriuretic Peptide 80.4 0.0 - 100.0 pg/mL    Comment: Performed at [facility], [address] [place], [place] [address]  CBC     Status: Abnormal   Collection Time: 11/18/22  9:35 PM  Result Value Ref Range   WBC 7.9 4.0 - 10.5 K/uL   RBC 3.69 (L) 3.87 - 5.11 MIL/uL   Hemoglobin 10.5 (L) 12.0 - 15.0 g/dL   HCT 60.4 (L) 54.0 - 98.1 %   MCV 96.5 80.0 - 100.0 fL   MCH 28.5 26.0 - 34.0 pg   MCHC 29.5 (L) 30.0 - 36.0 g/dL   RDW 19.1 47.8 - 29.5 %   Platelets 256 150 - 400 K/uL   nRBC  0.0 0.0 - 0.2 %    Comment: Performed at [facility], [address], [place], [place] [address]  Basic metabolic panel     Status: Abnormal   Collection Time: 11/18/22  9:35 PM  Result Value Ref Range   Sodium 140 135 - 145 mmol/L   Potassium 3.5 3.5 - 5.1 mmol/L   Chloride 101 98 - 111 mmol/L   CO2 29 22 - 32 mmol/L   Glucose, Bld 132 (H) 70 - 99 mg/dL    Comment: Glucose reference range applies only to samples taken after fasting for at least 8 hours.   BUN 16 8 - 23 mg/dL   Creatinine, Ser 8.65 0.44 - 1.00 mg/dL   Calcium 9.4 8.9 - 78.4 mg/dL   GFR, Estimated >69 >62 mL/min    Comment: (NOTE) Calculated using the CKD-EPI Creatinine Equation (2021)    Anion gap 10 5 - 15    Comment: Performed at [facility], [address], [place], [place] [address]  Troponin I (High Sensitivity)     Status: None   Collection Time: 11/18/22  9:35 PM  Result Value Ref Range   Troponin I (High Sensitivity) 9 <18 ng/L    Comment: (NOTE) Elevated high sensitivity troponin I (hsTnI) values and significant  changes across serial measurements may suggest ACS but many other  chronic and acute conditions are known to elevate hsTnI results.  Refer to the "Links" section for chest pain algorithms and additional  guidance. Performed at [facility], [address]  [place], [place] [address]   Blood gas, venous     Status: Abnormal   Collection Time: 11/18/22  9:35 PM  Result Value Ref Range   pH, Ven 7.37 7.25 - 7.43   pCO2, Ven 59 44 - 60 mmHg   pO2, Ven 41 32 - 45 mmHg   Bicarbonate 34.1 (H) 20.0 - 28.0 mmol/L   Acid-Base Excess 6.9 (H) 0.0 - 2.0 mmol/L   O2 Saturation 57.6 %   Patient temperature 37.0    Collection site VEIN     Comment: Performed at [facility], [address] [place], [place] [address]  Lactic acid, plasma     Status: Abnormal   Collection Time: 11/18/22  9:35 PM  Result Value Ref Range   Lactic Acid, Venous 2.4 (HH) 0.5 - 1.9  mmol/L    Comment: CRITICAL RESULT CALLED TO, READ BACK BY AND VERIFIED WITH Mosetta Putt @2219  on 11/18/22 SKL Performed at [facility], [address] [place], [place] [address]   Culture, blood (routine x 2)     Status: None   Collection Time: 11/18/22 10:43 PM   Specimen: BLOOD  Result Value Ref Range   Specimen Description BLOOD LEFT HAND    Special Requests      BOTTLES DRAWN AEROBIC AND ANAEROBIC Blood Culture results may not be optimal due to an excessive volume of blood received in culture bottles   Culture      NO GROWTH 5 DAYS Performed at [facility], [address] [place], [place] [address]    Report Status 11/23/2022 FINAL   Culture, blood (routine x 2)     Status: None   Collection Time: 11/18/22 10:43 PM   Specimen: BLOOD  Result Value Ref Range   Specimen Description BLOOD LEFT ARM    Special Requests      BOTTLES DRAWN AEROBIC AND ANAEROBIC Blood Culture adequate volume   Culture      NO GROWTH 5 DAYS Performed at [facility], [address], [place], [place] [address]    Report Status 11/23/2022 FINAL   Lactic acid, plasma     Status: None   Collection Time: 11/19/22 12:19 AM  Result Value Ref Range   Lactic Acid, Venous 1.5 0.5 - 1.9 mmol/L    Comment: Performed at [facility], [address] [place], [place] [address]  Troponin I (High Sensitivity)     Status: None   Collection Time: 11/19/22 12:19 AM  Result Value Ref Range   Troponin I (High Sensitivity) 8 <18 ng/L    Comment: (NOTE) Elevated high sensitivity troponin I (hsTnI) values and significant  changes across serial measurements may suggest ACS but many other  chronic and acute conditions are known to elevate hsTnI results.  Refer to the "Links" section for chest pain algorithms and additional  guidance. Performed at [facility], [address] [place], [place] [address]   HIV Antibody (routine testing w rflx)     Status: None    Collection Time: 11/19/22 12:19 AM  Result Value Ref Range   HIV Screen 4th Generation wRfx Non Reactive Non Reactive    Comment: Performed at [facility], [address], [place], [place] [address]  Basic metabolic panel     Status: Abnormal   Collection Time: 11/19/22  5:36 AM  Result Value Ref Range   Sodium 140 135 - 145 mmol/L   Potassium 4.1 3.5 - 5.1 mmol/L   Chloride 103  98 - 111 mmol/L   CO2 28 22 - 32 mmol/L   Glucose, Bld 290 (H) 70 - 99 mg/dL    Comment: Glucose reference range applies only to samples taken after fasting for at least 8 hours.   BUN 16 8 - 23 mg/dL   Creatinine, Ser 1.61 0.44 - 1.00 mg/dL   Calcium 9.0 8.9 - 09.6 mg/dL   GFR, Estimated >04 >54 mL/min    Comment: (NOTE) Calculated using the CKD-EPI Creatinine Equation (2021)    Anion gap 9 5 - 15    Comment: Performed at [facility], [address] [place], [place] [address]  CBC     Status: Abnormal   Collection Time: 11/19/22  5:36 AM  Result Value Ref Range   WBC 8.8 4.0 - 10.5 K/uL   RBC 3.44 (L) 3.87 - 5.11 MIL/uL   Hemoglobin 9.8 (L) 12.0 - 15.0 g/dL   HCT 91.4 (L) 78.2 - 95.6 %   MCV 93.3 80.0 - 100.0 fL   MCH 28.5 26.0 - 34.0 pg   MCHC 30.5 30.0 - 36.0 g/dL   RDW 21.3 08.6 - 57.8 %   Platelets 255 150 - 400 K/uL   nRBC 0.0 0.0 - 0.2 %    Comment: Performed at [facility], [address] [place], [place] [address]  Glucose, capillary     Status: Abnormal   Collection Time: 11/19/22  8:18 AM  Result Value Ref Range   Glucose-Capillary 296 (H) 70 - 99 mg/dL    Comment: Glucose reference range applies only to samples taken after fasting for at least 8 hours.  Glucose, capillary     Status: Abnormal   Collection Time: 11/19/22  1:15 PM  Result Value Ref Range   Glucose-Capillary 171 (H) 70 - 99 mg/dL    Comment: Glucose reference range applies only to samples taken after fasting for at least 8 hours.  Glucose, capillary     Status: Abnormal   Collection Time:  11/19/22  4:56 PM  Result Value Ref Range   Glucose-Capillary 220 (H) 70 - 99 mg/dL    Comment: Glucose reference range applies only to samples taken after fasting for at least 8 hours.  Glucose, capillary     Status: Abnormal   Collection Time: 11/19/22  9:00 PM  Result Value Ref Range   Glucose-Capillary 246 (H) 70 - 99 mg/dL    Comment: Glucose reference range applies only to samples taken after fasting for at least 8 hours.  Glucose, capillary     Status: Abnormal   Collection Time: 11/20/22  7:52 AM  Result Value Ref Range   Glucose-Capillary 337 (H) 70 - 99 mg/dL    Comment: Glucose reference range applies only to samples taken after fasting for at least 8 hours.  Glucose, capillary     Status: Abnormal   Collection Time: 11/20/22 12:12 PM  Result Value Ref Range   Glucose-Capillary 203 (H) 70 - 99 mg/dL    Comment: Glucose reference range applies only to samples taken after fasting for at least 8 hours.  POCT glycosylated hemoglobin (Hb A1C)     Status: Abnormal   Collection Time: 12/08/22 10:59 AM  Result Value Ref Range   Hemoglobin A1C 6.3 (A) 4.0 - 5.6 %   HbA1c POC (<> result, manual entry)     HbA1c, POC (prediabetic range)     HbA1c, POC (controlled diabetic range)      Radiology: DG Chest 1 View  Result Date: 11/18/2022 CLINICAL DATA:  Hypoxia, COPD, short of breath, history of lung cancer EXAM: CHEST  1 VIEW COMPARISON:  09/21/2022, 06/05/2022 FINDINGS: Single frontal view of the chest demonstrates a stable cardiac silhouette. Chronic post therapeutic changes are seen at the right lung base. No new airspace disease, effusion, or pneumothorax. Stable background emphysema. No acute bony abnormality. The acute right ninth rib fracture seen on prior study is not well visualized on this exam. IMPRESSION: 1. Stable post therapeutic changes at the right lung base. 2. Emphysema. 3. No acute airspace disease. Electronically Signed   By: Sharlet Salina M.D.   On: 11/18/2022 22:04     No results found.  No results found.    Assessment and Plan: Patient Active Problem List   Diagnosis Date Noted   Acute pain of right shoulder 11/30/2022   Anxiety and depression 11/19/2022   GERD without esophagitis 11/19/2022   Uncontrolled type 2 diabetes mellitus with hyperglycemia, with long-term current use of insulin (HCC) 11/19/2022   Right rib fracture 09/23/2022   Symptomatic bradycardia 07/09/2021   Chronic hypoxemic respiratory failure (HCC)    CAP (community acquired pneumonia) 06/26/2021   COPD exacerbation (HCC) 06/11/2021   SOB (shortness of breath) 05/01/2021   HCAP (healthcare-associated pneumonia) 05/01/2021   Chronic combined systolic and diastolic CHF (congestive heart failure) (HCC) 05/01/2021   Acute exacerbation of chronic obstructive pulmonary disease (COPD) (HCC) 04/30/2021   Elevated troponin 04/01/2021   Acute on chronic respiratory failure with hypoxia (HCC) 03/31/2021   Pneumothorax after biopsy 05/03/2020   Pneumothorax on right 05/01/2020   Chest pain on breathing 04/21/2020   Pleuritic chest pain 04/11/2020   Right lower lobe pulmonary nodule 04/02/2020   Iron deficiency anemia 04/02/2020   Pica in adults 04/02/2020   Goals of care, counseling/discussion 03/14/2020   Pain in left wrist 09/24/2019   Encounter for general adult medical examination with abnormal findings 06/28/2019   Elevated total protein 01/22/2019   Uncontrolled type 2 diabetes mellitus with hyperglycemia (HCC) 05/16/2018   Chronic obstructive pulmonary disease (HCC) 05/16/2018   Vitamin D deficiency 05/16/2018   Flu vaccine need 05/16/2018   Dysuria 05/16/2018   Cancer of lower lobe of right lung (HCC) 01/21/2018   Screening for breast cancer 10/12/2017   Personal history of tobacco use, presenting hazards to health 02/03/2017   PAD (peripheral artery disease) (HCC) 06/22/2016   Essential hypertension 05/26/2016   Diabetes (HCC) 05/26/2016   Hyperlipidemia  05/26/2016   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/2016   Uterine leiomyoma 04/30/2016   Elevated CEA 01/31/2016   Special screening for malignant neoplasms, colon    Benign neoplasm of sigmoid colon    Primary lung cancer (HCC)    Malignant neoplasm of upper lobe of right lung (HCC)     1. Chronic obstructive pulmonary disease, unspecified COPD type (HCC) Will treat for exacerbation and continue inhalers as prescribed - SPIRIVA HANDIHALER 18 MCG inhalation capsule; INHALE THE CONTENTS OF 1 CAPSULES BY MOUTH ONCE DAILY - DO NOT SWALLOW CAPSULES  Dispense: 90 capsule; Refill: 3 - predniSONE (DELTASONE) 10 MG tablet; Take one tab 3 x day for 3 days, then take one tab 2 x a day for 3 days and then take one tab a day for 3 days for copd  Dispense: 18 tablet; Refill: 0 - azithromycin (ZITHROMAX) 250 MG tablet; Take one tab a day for 10 days for uri  Dispense: 10 tablet; Refill: 0  2.  Chronic respiratory failure with hypoxia (HCC) Continue oxygen as before  3. Oxygen dependent Continue oxygen as before   General Counseling: I have discussed the findings of the evaluation and examination with [facility].  I have also discussed any further diagnostic evaluation thatmay be needed or ordered today. Angela Jensen verbalizes understanding of the findings of todays visit. We also reviewed her medications today and discussed drug interactions and side effects including but not limited excessive drowsiness and altered mental states. We also discussed that there is always a risk not just to her but also people around her. she has been encouraged to call the office with any questions or concerns that should arise related to todays visit.  No orders of the defined types were placed in this encounter.    Time spent: 30  I have personally obtained a history, examined the patient, evaluated laboratory and imaging results, formulated the assessment and plan and placed  orders. This patient was seen by Lynn Ito, PA-C in collaboration with Dr. Freda Munro as a part of collaborative care agreement.     Angela Pax, MD [facility] Pulmonary and Critical Care Sleep medicine

## 2023-01-04 DIAGNOSIS — J449 Chronic obstructive pulmonary disease, unspecified: Secondary | ICD-10-CM | POA: Diagnosis not present

## 2023-01-04 DIAGNOSIS — R0602 Shortness of breath: Secondary | ICD-10-CM | POA: Diagnosis not present

## 2023-01-04 DIAGNOSIS — I10 Essential (primary) hypertension: Secondary | ICD-10-CM | POA: Diagnosis not present

## 2023-01-12 ENCOUNTER — Other Ambulatory Visit: Payer: Self-pay | Admitting: Nurse Practitioner

## 2023-01-12 DIAGNOSIS — Z760 Encounter for issue of repeat prescription: Secondary | ICD-10-CM

## 2023-01-12 NOTE — Telephone Encounter (Signed)
Last 5/24  next 8/24

## 2023-01-19 ENCOUNTER — Encounter: Payer: Self-pay | Admitting: Nurse Practitioner

## 2023-01-19 ENCOUNTER — Ambulatory Visit (INDEPENDENT_AMBULATORY_CARE_PROVIDER_SITE_OTHER): Payer: 59 | Admitting: Nurse Practitioner

## 2023-01-19 VITALS — BP 120/72 | HR 88 | Temp 98.1°F | Resp 16 | Ht 59.0 in | Wt 133.4 lb

## 2023-01-19 DIAGNOSIS — E1165 Type 2 diabetes mellitus with hyperglycemia: Secondary | ICD-10-CM

## 2023-01-19 DIAGNOSIS — I952 Hypotension due to drugs: Secondary | ICD-10-CM | POA: Diagnosis not present

## 2023-01-19 MED ORDER — METOPROLOL TARTRATE 25 MG PO TABS: 12.5000 mg | ORAL_TABLET | Freq: Two times a day (BID) | ORAL | 1 refills | Status: DC

## 2023-01-19 MED ORDER — RELION PEN NEEDLES 31G X 6 MM MISC: 0 refills | Status: DC

## 2023-01-19 MED ORDER — ACCU-CHEK SOFTCLIX LANCETS MISC
1 refills | Status: AC
Start: 2023-01-19 — End: ?

## 2023-01-19 NOTE — Progress Notes (Signed)
[facility] [address] [place], [place] [address]  Internal MEDICINE  Office Visit Note  Patient Name: Angela Jensen  [id number]  [id number]  Date of Service: 01/19/2023  Chief Complaint  Patient presents with   Acute Visit    Bp dropping     HPI Angela Jensen presents for an acute sick visit for low blood pressure Low BP with SBP as low as the 70s  This has been happening for about 1 week Getting headaches as well Current on metoprolol 25 mg BID and lisinopril-HCTZ 20-25 mg daily.    Current Medication:  Outpatient Encounter Medications as of 01/19/2023  Medication Sig   albuterol (VENTOLIN HFA) 108 (90 Base) MCG/ACT inhaler INHALE 2 PUFFS BY MOUTH EVERY 6 HOURS AS NEEDED FOR WHEEZING OR SHORTNESS OF BREATH   ALPRAZolam (XANAX) 0.25 MG tablet TAKE 1 TABLET BY MOUTH THREE TIMES DAILY AS NEEDED FOR ANXIETY   Blood Glucose Monitoring Suppl (ACCU-CHEK GUIDE) w/Device KIT Use as directed Dx e11.65   calcium-vitamin D (OSCAL WITH D) 500-5 MG-MCG tablet Take 1 tablet by mouth 2 (two) times daily.   clopidogrel (PLAVIX) 75 MG tablet Take 1 tablet (75 mg total) by mouth daily.   Continuous Blood Gluc Receiver (DEXCOM G7 RECEIVER) DEVI Use one as directed for uncontrolled dm   Continuous Blood Gluc Sensor (DEXCOM G7 SENSOR) MISC Apply 1 sensor to skin on designated areas every 10 days to monitor glucose levels with sensor or smart phone app   cyclobenzaprine (FLEXERIL) 10 MG tablet Take 1 tablet (10 mg total) by mouth at bedtime. Take one tab po qhs for back spasm prn only   diclofenac (VOLTAREN) 75 MG EC tablet Take 1 tablet (75 mg total) by mouth 2 (two) times daily.   escitalopram (LEXAPRO) 10 MG tablet Take 1 tablet (10 mg total) by mouth daily.   ferrous sulfate 325 (65 FE) MG tablet Take 325 mg by mouth 2 (two) times daily with a meal.   fluticasone (FLONASE) 50 MCG/ACT nasal spray Place 2 sprays into both nostrils daily.   furosemide (LASIX) 20 MG tablet Take 1 tablet (20 mg  total) by mouth daily as needed.   gabapentin (NEURONTIN) 300 MG capsule Take 1 capsule (300 mg total) by mouth 3 (three) times daily.   glucose blood (ACCU-CHEK GUIDE) test strip USE STRIP TO CHECK BLOOD SUGAR FIRST THING IN THE MORNING, BEFORE MEALS AND AT BEDTIME. (A TOTAL OF 5 TIMES PER DAY)   insulin detemir (LEVEMIR FLEXTOUCH) 100 UNIT/ML FlexPen Inject 15 Units into the skin daily with supper.   ipratropium-albuterol (DUONEB) 0.5-2.5 (3) MG/3ML SOLN Take 3 mLs by nebulization every 4 (four) hours as needed.   lisinopril-hydrochlorothiazide (ZESTORETIC) 20-25 MG tablet Take 1 tablet by mouth daily.   metFORMIN (GLUCOPHAGE) 500 MG tablet TAKE 2 TABLETS BY MOUTH ONCE DAILY WITH BREAKFAST   mometasone-formoterol (DULERA) 200-5 MCG/ACT AERO Inhale 2 puffs into the lungs 2 (two) times daily.   Omega-3 Fatty Acids (FISH OIL) 1000 MG CAPS Take 1 capsule by mouth daily.   OXYGEN Inhale 4 L into the lungs. PT USES [employer] [facility] FOR OXYGEN   potassium chloride (KLOR-CON) 10 MEQ tablet Take 1 tablet (10 mEq total) by mouth every other day.   predniSONE (DELTASONE) 10 MG tablet Take one tab 3 x day for 3 days, then take one tab 2 x a day for 3 days and then take one tab a day for 3 days for copd   SPIRIVA HANDIHALER 18  MCG inhalation capsule INHALE THE CONTENTS OF 1 CAPSULES BY MOUTH ONCE DAILY - DO NOT SWALLOW CAPSULES   traMADol (ULTRAM) 50 MG tablet Take 1 tablet (50 mg total) by mouth every 12 (twelve) hours as needed.   [DISCONTINUED] Accu-Chek Softclix Lancets lancets Use as instructed for twice a daily Dx E11.65   [DISCONTINUED] azithromycin (ZITHROMAX) 250 MG tablet Take one tab a day for 10 days for uri   [DISCONTINUED] metoprolol tartrate (LOPRESSOR) 25 MG tablet Take 1/2 (one-half) tablet by mouth twice daily   [DISCONTINUED] RELION PEN NEEDLES 31G X 6 MM MISC USE 1  ONCE DAILY AS DIRECTED   Accu-Chek Softclix Lancets lancets Use 1 lancet 3 times daily to check glucose for diabetes    famotidine (PEPCID) 20 MG tablet Take 1 tablet (20 mg total) by mouth daily for 14 days.   Insulin Pen Needle (RELION PEN NEEDLES) 31G X 6 MM MISC USE 1  pen needle with insulin pen ONCE DAILY for diabetes   metoprolol tartrate (LOPRESSOR) 25 MG tablet Take 0.5 tablets (12.5 mg total) by mouth 2 (two) times daily.   No facility-administered encounter medications on file as of 01/19/2023.      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/2016   Cancer (HCC) 2012   Right Lung CA   COPD (chronic obstructive pulmonary disease) (HCC)    Depression    Diabetes mellitus without complication (HCC)    Patient takes Janumet   Essential hypertension 05/26/2016   Heart failure (HCC) 2022   Hypercholesteremia    Oxygen dependent    2L at nite    PAD (peripheral artery disease) (HCC) 06/22/2016   Peripheral vascular disease (HCC)    Personal history of radiation therapy    Shortness of breath dyspnea    with exertion    Sialolithiasis    Sleep apnea    Wears dentures    full upper and lower     Vital Signs: BP 120/72   Pulse 88   Temp 98.1 F (36.7 C)   Resp 16   Ht 4\' 11"  (1.499 m)   Wt 133 lb 6.4 oz (60.5 kg)   SpO2 93% Comment: 2.5L  BMI 26.94 kg/m    Review of Systems  Constitutional:  Negative for chills, fatigue and unexpected weight change.  HENT:  Negative for congestion, rhinorrhea, sneezing and sore throat.   Eyes:  Negative for redness.  Respiratory:  Negative for cough, chest tightness and shortness of breath.   Cardiovascular:  Negative for chest pain and palpitations.  Gastrointestinal:  Negative for abdominal pain, constipation, diarrhea, nausea and vomiting.  Genitourinary:  Negative for dysuria and frequency.  Musculoskeletal:  Negative for arthralgias, back pain, joint swelling and neck pain.  Skin:  Negative for rash.   Neurological:  Positive for dizziness, light-headedness and headaches. Negative for tremors and numbness.  Hematological:  Negative for adenopathy. Does not bruise/bleed easily.  Psychiatric/Behavioral:  Negative for behavioral problems (Depression), sleep disturbance and suicidal ideas. The patient is not nervous/anxious.     Physical Exam Vitals reviewed.  Constitutional:      General: She is not in acute distress.    Appearance: Normal appearance. She is not ill-appearing.  HENT:     Head: Normocephalic and atraumatic.  Eyes:     Pupils: Pupils are equal, round, and reactive to  light.  Cardiovascular:     Rate and Rhythm: Normal rate and regular rhythm.  Pulmonary:     Effort: Pulmonary effort is normal. No respiratory distress.  Neurological:     Mental Status: She is alert and oriented to person, place, and time.  Psychiatric:        Mood and Affect: Mood normal.        Behavior: Behavior normal.       Assessment/Plan: 1. Hypotension due to drugs Decrease metoprolol dose to 1/2 tablet twice daily - metoprolol tartrate (LOPRESSOR) 25 MG tablet; Take 0.5 tablets (12.5 mg total) by mouth 2 (two) times daily.  Dispense: 90 tablet; Refill: 1  2. Uncontrolled type 2 diabetes mellitus with hyperglycemia (HCC) Refills ordered, continue checking glucose as instructed  - Insulin Pen Needle (RELION PEN NEEDLES) 31G X 6 MM MISC; USE 1  pen needle with insulin pen ONCE DAILY for diabetes  Dispense: 50 each; Refill: 0 - Accu-Chek Softclix Lancets lancets; Use 1 lancet 3 times daily to check glucose for diabetes  Dispense: 300 each; Refill: 1   General Counseling: Angela Jensen verbalizes understanding of the findings of todays visit and agrees with plan of treatment. I have discussed any further diagnostic evaluation that may be needed or ordered today. We also reviewed her medications today. she has been encouraged to call the office with any questions or concerns that should arise related to  todays visit.    Counseling:    No orders of the defined types were placed in this encounter.   Meds ordered this encounter  Medications   metoprolol tartrate (LOPRESSOR) 25 MG tablet    Sig: Take 0.5 tablets (12.5 mg total) by mouth 2 (two) times daily.    Dispense:  90 tablet    Refill:  1   Insulin Pen Needle (RELION PEN NEEDLES) 31G X 6 MM MISC    Sig: USE 1  pen needle with insulin pen ONCE DAILY for diabetes    Dispense:  50 each    Refill:  0    Dx code E11.65   Accu-Chek Softclix Lancets lancets    Sig: Use 1 lancet 3 times daily to check glucose for diabetes    Dispense:  300 each    Refill:  1    Dx code E11.65    Return if symptoms worsen or fail to improve, for keep regular scheduled follow up .  [place] Controlled Substance Database was reviewed by me for overdose risk score (ORS)  Time spent:20 Minutes Time spent with patient included reviewing progress notes, labs, imaging studies, and discussing plan for follow up.   This patient was seen by Sallyanne Kuster, FNP-C in collaboration with Dr. Beverely Risen as a part of collaborative care agreement.  Kadia Abaya R. Tedd Sias, MSN, FNP-C Internal Medicine

## 2023-01-19 NOTE — Patient Instructions (Signed)
Decrease metoprolol dose to a 1/2 tablet twice daily.  Check blood pressure daily. Call clinic if BP is still low and not improving.

## 2023-02-03 DIAGNOSIS — J4489 Other specified chronic obstructive pulmonary disease: Secondary | ICD-10-CM | POA: Diagnosis not present

## 2023-02-03 DIAGNOSIS — I5032 Chronic diastolic (congestive) heart failure: Secondary | ICD-10-CM | POA: Diagnosis not present

## 2023-02-03 DIAGNOSIS — E119 Type 2 diabetes mellitus without complications: Secondary | ICD-10-CM | POA: Diagnosis not present

## 2023-02-03 DIAGNOSIS — I700 Atherosclerosis of aorta: Secondary | ICD-10-CM | POA: Diagnosis not present

## 2023-02-03 DIAGNOSIS — I10 Essential (primary) hypertension: Secondary | ICD-10-CM | POA: Diagnosis not present

## 2023-02-03 DIAGNOSIS — Z950 Presence of cardiac pacemaker: Secondary | ICD-10-CM | POA: Diagnosis not present

## 2023-02-03 DIAGNOSIS — G4730 Sleep apnea, unspecified: Secondary | ICD-10-CM | POA: Diagnosis not present

## 2023-02-03 DIAGNOSIS — J9621 Acute and chronic respiratory failure with hypoxia: Secondary | ICD-10-CM | POA: Diagnosis not present

## 2023-02-03 DIAGNOSIS — E782 Mixed hyperlipidemia: Secondary | ICD-10-CM | POA: Diagnosis not present

## 2023-02-03 DIAGNOSIS — I739 Peripheral vascular disease, unspecified: Secondary | ICD-10-CM | POA: Diagnosis not present

## 2023-02-03 DIAGNOSIS — Z794 Long term (current) use of insulin: Secondary | ICD-10-CM | POA: Diagnosis not present

## 2023-02-03 DIAGNOSIS — I442 Atrioventricular block, complete: Secondary | ICD-10-CM | POA: Diagnosis not present

## 2023-02-04 DIAGNOSIS — I10 Essential (primary) hypertension: Secondary | ICD-10-CM | POA: Diagnosis not present

## 2023-02-04 DIAGNOSIS — R0602 Shortness of breath: Secondary | ICD-10-CM | POA: Diagnosis not present

## 2023-02-04 DIAGNOSIS — J449 Chronic obstructive pulmonary disease, unspecified: Secondary | ICD-10-CM | POA: Diagnosis not present

## 2023-02-11 ENCOUNTER — Other Ambulatory Visit: Payer: Self-pay | Admitting: Nurse Practitioner

## 2023-02-11 DIAGNOSIS — Z760 Encounter for issue of repeat prescription: Secondary | ICD-10-CM

## 2023-03-01 ENCOUNTER — Other Ambulatory Visit: Payer: Self-pay | Admitting: Nurse Practitioner

## 2023-03-01 DIAGNOSIS — E1165 Type 2 diabetes mellitus with hyperglycemia: Secondary | ICD-10-CM

## 2023-03-02 ENCOUNTER — Other Ambulatory Visit: Payer: Self-pay

## 2023-03-02 DIAGNOSIS — Z760 Encounter for issue of repeat prescription: Secondary | ICD-10-CM

## 2023-03-02 MED ORDER — GABAPENTIN 300 MG PO CAPS: 300.0000 mg | ORAL_CAPSULE | Freq: Three times a day (TID) | ORAL | 3 refills | Status: DC

## 2023-03-06 DIAGNOSIS — J449 Chronic obstructive pulmonary disease, unspecified: Secondary | ICD-10-CM | POA: Diagnosis not present

## 2023-03-06 DIAGNOSIS — R0602 Shortness of breath: Secondary | ICD-10-CM | POA: Diagnosis not present

## 2023-03-06 DIAGNOSIS — I10 Essential (primary) hypertension: Secondary | ICD-10-CM | POA: Diagnosis not present

## 2023-03-09 ENCOUNTER — Encounter: Payer: Self-pay | Admitting: Hematology and Oncology

## 2023-03-11 ENCOUNTER — Other Ambulatory Visit: Payer: Self-pay | Admitting: Nurse Practitioner

## 2023-03-11 DIAGNOSIS — Z760 Encounter for issue of repeat prescription: Secondary | ICD-10-CM

## 2023-03-15 NOTE — Telephone Encounter (Signed)
Please send

## 2023-03-17 ENCOUNTER — Encounter: Payer: Self-pay | Admitting: Hematology and Oncology

## 2023-03-23 ENCOUNTER — Other Ambulatory Visit: Payer: Self-pay | Admitting: Nurse Practitioner

## 2023-03-23 DIAGNOSIS — E1165 Type 2 diabetes mellitus with hyperglycemia: Secondary | ICD-10-CM

## 2023-03-24 ENCOUNTER — Ambulatory Visit
Admission: RE | Admit: 2023-03-24 | Discharge: 2023-03-24 | Disposition: A | Discharge status: Home / Self Care | Payer: 59 | Source: Ambulatory Visit | Attending: Oncology | Admitting: Oncology

## 2023-03-24 DIAGNOSIS — C3491 Malignant neoplasm of unspecified part of right bronchus or lung: Secondary | ICD-10-CM | POA: Diagnosis not present

## 2023-03-24 DIAGNOSIS — I700 Atherosclerosis of aorta: Secondary | ICD-10-CM | POA: Diagnosis not present

## 2023-03-24 DIAGNOSIS — S2241XA Multiple fractures of ribs, right side, initial encounter for closed fracture: Secondary | ICD-10-CM | POA: Diagnosis not present

## 2023-03-24 DIAGNOSIS — C3490 Malignant neoplasm of unspecified part of unspecified bronchus or lung: Secondary | ICD-10-CM | POA: Diagnosis not present

## 2023-03-24 LAB — POCT I-STAT CREATININE: Creatinine, Ser: 0.7 mg/dL (ref 0.44–1.00)

## 2023-03-24 MED ORDER — IOHEXOL 300 MG/ML  SOLN
75.0000 mL | Freq: Once | INTRAMUSCULAR | Status: AC | PRN
  Administered 2023-03-24: 75 mL via INTRAVENOUS

## 2023-03-31 ENCOUNTER — Inpatient Hospital Stay: Payer: 59 | Admitting: Oncology

## 2023-03-31 ENCOUNTER — Encounter: Payer: Self-pay | Admitting: Oncology

## 2023-03-31 ENCOUNTER — Other Ambulatory Visit: Payer: Self-pay | Admitting: Nurse Practitioner

## 2023-03-31 ENCOUNTER — Inpatient Hospital Stay: Payer: 59 | Attending: Radiation Oncology

## 2023-03-31 VITALS — BP 167/85 | HR 110 | Temp 98.1°F | Resp 18 | Wt 123.7 lb

## 2023-03-31 DIAGNOSIS — I700 Atherosclerosis of aorta: Secondary | ICD-10-CM | POA: Diagnosis not present

## 2023-03-31 DIAGNOSIS — J9611 Chronic respiratory failure with hypoxia: Secondary | ICD-10-CM

## 2023-03-31 DIAGNOSIS — C3491 Malignant neoplasm of unspecified part of right bronchus or lung: Secondary | ICD-10-CM | POA: Diagnosis not present

## 2023-03-31 DIAGNOSIS — D649 Anemia, unspecified: Secondary | ICD-10-CM | POA: Diagnosis not present

## 2023-03-31 DIAGNOSIS — Z85118 Personal history of other malignant neoplasm of bronchus and lung: Secondary | ICD-10-CM | POA: Insufficient documentation

## 2023-03-31 DIAGNOSIS — D509 Iron deficiency anemia, unspecified: Secondary | ICD-10-CM

## 2023-03-31 DIAGNOSIS — R970 Elevated carcinoembryonic antigen [CEA]: Secondary | ICD-10-CM | POA: Insufficient documentation

## 2023-03-31 DIAGNOSIS — R911 Solitary pulmonary nodule: Secondary | ICD-10-CM | POA: Insufficient documentation

## 2023-03-31 DIAGNOSIS — E1165 Type 2 diabetes mellitus with hyperglycemia: Secondary | ICD-10-CM

## 2023-03-31 DIAGNOSIS — Z801 Family history of malignant neoplasm of trachea, bronchus and lung: Secondary | ICD-10-CM | POA: Diagnosis not present

## 2023-03-31 LAB — CBC WITH DIFFERENTIAL/PLATELET
Abs Immature Granulocytes: 0.01 10*3/uL (ref 0.00–0.07)
Basophils Absolute: 0 10*3/uL (ref 0.0–0.1)
Basophils Relative: 0 %
Eosinophils Absolute: 0.1 10*3/uL (ref 0.0–0.5)
Eosinophils Relative: 2 %
HCT: 30.1 % — ABNORMAL LOW (ref 36.0–46.0)
Hemoglobin: 8.7 g/dL — ABNORMAL LOW (ref 12.0–15.0)
Immature Granulocytes: 0 %
Lymphocytes Relative: 16 %
Lymphs Abs: 0.7 10*3/uL (ref 0.7–4.0)
MCH: 28.7 pg (ref 26.0–34.0)
MCHC: 28.9 g/dL — ABNORMAL LOW (ref 30.0–36.0)
MCV: 99.3 fL (ref 80.0–100.0)
Monocytes Absolute: 0.4 10*3/uL (ref 0.1–1.0)
Monocytes Relative: 8 %
Neutro Abs: 3.3 10*3/uL (ref 1.7–7.7)
Neutrophils Relative %: 74 %
Platelets: 322 10*3/uL (ref 150–400)
RBC: 3.03 MIL/uL — ABNORMAL LOW (ref 3.87–5.11)
RDW: 14.2 % (ref 11.5–15.5)
WBC: 4.5 10*3/uL (ref 4.0–10.5)
nRBC: 0 % (ref 0.0–0.2)

## 2023-03-31 LAB — TECHNOLOGIST SMEAR REVIEW: Plt Morphology: NORMAL

## 2023-03-31 LAB — COMPREHENSIVE METABOLIC PANEL
ALT: 16 U/L (ref 0–44)
AST: 20 U/L (ref 15–41)
Albumin: 3.9 g/dL (ref 3.5–5.0)
Alkaline Phosphatase: 54 U/L (ref 38–126)
Anion gap: 9 (ref 5–15)
BUN: 13 mg/dL (ref 8–23)
CO2: 31 mmol/L (ref 22–32)
Calcium: 8.7 mg/dL — ABNORMAL LOW (ref 8.9–10.3)
Chloride: 99 mmol/L (ref 98–111)
Creatinine, Ser: 0.63 mg/dL (ref 0.44–1.00)
GFR, Estimated: >60 mL/min (ref >60)
Glucose, Bld: 194 mg/dL — ABNORMAL HIGH (ref 70–99)
Potassium: 3.6 mmol/L (ref 3.5–5.1)
Sodium: 139 mmol/L (ref 135–145)
Total Bilirubin: 0.2 mg/dL — ABNORMAL LOW (ref 0.3–1.2)
Total Protein: 6.9 g/dL (ref 6.5–8.1)

## 2023-03-31 LAB — RETIC PANEL
Immature Retic Fract: 24.6 % — ABNORMAL HIGH (ref 2.3–15.9)
RBC.: 3.09 MIL/uL — ABNORMAL LOW (ref 3.87–5.11)
Retic Count, Absolute: 116.5 10*3/uL (ref 19.0–186.0)
Retic Ct Pct: 3.8 % — ABNORMAL HIGH (ref 0.4–3.1)
Reticulocyte Hemoglobin: 26.4 pg — ABNORMAL LOW (ref >27.9)

## 2023-03-31 LAB — VITAMIN B12: Vitamin B-12: 475 pg/mL (ref 180–914)

## 2023-03-31 LAB — IRON AND TIBC
Iron: 74 ug/dL (ref 28–170)
Saturation Ratios: 16 % (ref 10.4–31.8)
TIBC: 455 ug/dL — ABNORMAL HIGH (ref 250–450)
UIBC: 381 ug/dL

## 2023-03-31 LAB — LACTATE DEHYDROGENASE: LDH: 127 U/L (ref 98–192)

## 2023-03-31 LAB — FOLATE: Folate: 14.2 ng/mL (ref >5.9)

## 2023-03-31 LAB — FERRITIN: Ferritin: 14 ng/mL (ref 11–307)

## 2023-03-31 NOTE — Progress Notes (Signed)
Hematology/Oncology Progress note [phone] [phone]     Chief Complaint: Angela Jensen is a 67 y.o. 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 -Recurrence-SBRT completed 06/11/2020.NGS showed TMB >10 muts/mb, MS stable, KRAS G12V, MYC amplification,STK11 S21fs*47, FUBP1 splice sit 473+1G>T August 2024 CT scan results reviewed and discussed with patient New 4mm lung nodule apical segment right upper lobe nodule. Nodule is too small for PET evaluation or biopsy.  Recommend attention on follow up  Recommend continued surveillance CT scan in 6 months.  Normocytic anemia Hemoglobin has decreased, check B12, iron tibc ferritin, LDH, haptoglobin, retic panel, folate, myeloma panel, light chain ratio.   Chronic hypoxemic respiratory failure (HCC) Stable.  Continue oxygen supplementation.  Orders Placed This Encounter  Procedures   Multiple Myeloma Panel (SPEP&IFE w/QIG)    Standing Status:   Future    Number of Occurrences:   1    Standing Expiration Date:   03/30/2024   Kappa/lambda light chains    Standing Status:   Future    Number of Occurrences:   1    Standing Expiration Date:   03/30/2024   Vitamin B12    Standing Status:   Future    Number of Occurrences:   1    Standing Expiration Date:   03/30/2024   Lactate dehydrogenase    Standing Status:   Future    Number of Occurrences:   1    Standing Expiration Date:   03/30/2024   Retic Panel    Standing Status:   Future    Number of Occurrences:   1    Standing Expiration Date:   03/30/2024   Technologist smear review    Standing Status:   Future    Number of Occurrences:   1    Standing Expiration Date:   03/30/2024    Order Specific Question:    Clinical information:    Answer:   anemia   Ferritin    Standing Status:   Future    Number of Occurrences:   1    Standing Expiration Date:   03/30/2024   Folate    Standing Status:   Future    Number of Occurrences:   1    Standing Expiration Date:   03/30/2024   Haptoglobin    Standing Status:   Future    Number of Occurrences:   1    Standing Expiration Date:   03/30/2024   CBC with Differential (Cancer Center Only)    Standing Status:   Future    Standing Expiration Date:   03/30/2024   CMP (Cancer Center only)    Standing Status:   Future    Standing Expiration Date:   03/30/2024   Vitamin B12    Standing Status:   Future    Standing Expiration Date:   03/30/2024   Follow-up in 6 months All questions were answered. The patient knows to call the clinic with any problems, questions or concerns.  Rickard Patience, MD, PhD [facility] Hematology Oncology 03/31/2023    PERTINENT ONCOLOGY HISTORY Patient previously followed up by Dr.Corcoran, patient switched care to me on 03/20/21 Extensive medical record review was performed by me  # Stage I RUL adenocarcinoma  s/p IMRT in 02/2012.   She  was not a good surgery candidate secondary to her lung function (FEV1 28%).  CT guided biopsy of the superior and inferior RUL nodules on 12/30/2011 revealed well differentiated adenocarcinoma of lung primary.  EGFR testing revealed no mutation.  She received IMRT 6000 cGy to both nodules from 01/26/2012 - 03/10/2012.   In 04/2015, there was an attempt at radiofrequency ablation of 1 enlarging RUL nodule.  At the time of the procedure, the nodule had shrunk to 5 mm from 9 mm.     # Stage I RLL adenocarcinoma 2019   CT guided RLL biopsy on 02/09/2018 revealed adenocarcinoma of lung origin, lepidic and papillary patterns.   She received 6000 cGy in 5 fractions to RLL nodule from 03/22/2018 - 04/05/2018.   Chest CT on 03/04/2020 revealed enlarging and new pulmonary nodules suspicious for metastatic  disease or metachronous primary.  PET scan on 03/19/2020 revealed that the right lower lobe pulmonary nodules did not demonstrate any hypermetabolism to suggest neoplasm. However, these were somewhat worrisome; recommendation was close CT surveillance. There was no mediastinal or hilar lymphadenopathy.  Chest CT  on 09/11/2020 revealed interval treatment of the right lower lobe nodules. These nodules were partly obscured by adjacent parenchymal opacity, although appear decreased in size.  CT guided RLL biopsy on 04/24/2020 revealed adenocarcinoma of the lung with papillary and solid patterns. 05/28/2020 - 06/11/2020  SBRT to the right lung. She received 60 Gy in 5 fractions.  She has an elevated CEA of unclear etiology.  CEA was 8.1 on 01/31/2016, 9.2 on 03/03/2016, 7.9 on 10/12/2017, and 7.9 on 01/19/2019.   Colonoscopy on 06/25/2015 revealed a 4 mm polyp in the sigmoid colon.  Pathology revealed a tubular adenoma without evidence of dysplasia or malignancy.   Pelvic MRI on 04/30/2016 revealed a 3.4 x 2.5 cm ovoid mass in the LEFT adnexa with signal characteristics identical to the intramural leiomyomas and consistent with an exophytic leiomyoma.  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  Endometrium was normal with a normal junctional zone.  Ovaries were not identified.  # iron deficiency anemia.  She required 2 units of PRBCs on 04/04/2020.  Colonoscopy on 06/25/2015 revealed one 4 mm polyp (tubular adenoma) in the sigmoid colon. There were non-bleeding internal hemorrhoids.  Colonoscopy on 07/26/2020 revealed diverticulosis in the entire examined colon. There were non-bleeding internal hemorrhoids. EGD on 07/26/2020 revealed erosive gastropathy with no bleeding and no stigmata of recent bleeding.   07/11/2021, patient was hospitalized and a CT angio chest PE with contrast was ordered and there was slight increase in size".  3 over several months of larger than 1 cm nodular-like densities along the  right minor and major fissure in the region of prior radiation therapy.  Recurrent malignancy cannot be excluded.   12/29 2022, a PET scan was obtained for further evaluation.  PET scan showed stable scarring type changes involving the right lung.  No hypermetabolic pulmonary lesions to suggest recurrent or metastatic tumor.  02/20/2022, CT chest with contrast showed continued interval progression of the consolidative plaque-like opacity in the right lower lobe.  This area was previously FDG negative on PET scan in December 2022.  New 5 mm left lower lobe pulmonary nodule.  09/21/2022, CT chest with contrast showed 1. Nondisplaced mildly-comminuted fracture of the posterolateral right ninth rib. 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 No pneumothorax. 2. Stable examination demonstrating chronic postradiation changes in the right lung with  no definitive evidence to suggest locally recurrent disease or definite metastatic disease in the thorax.  3. Diffuse bronchial wall thickening with moderate centrilobular and paraseptal emphysema; imaging findings suggestive of underlying COPD. 4. Aortic atherosclerosis,   INTERVAL HISTORY Angela Jensen is a 67 y.o. female who has above history reviewed by me today presents for follow up visit for iron deficiency anemia, history of lung cancer  03/24/2023 CT chest w contrast  1. New 4 mm apical segment right upper lobe nodule, worrisome for disease progression. 2. Pathologic right rib fractures, as before, possibly radiation related. 3. Aortic atherosclerosis (ICD10-I70.0). Coronary artery calcification. 4.  Emphysema  She reports feeling well, chronic SOB, on nasal cannula oxygen.    Past Medical History:  Diagnosis Date   Anemia    Arthritis    Asthma    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/2016    Cancer (HCC) 2012   Right Lung CA   COPD (chronic obstructive pulmonary disease) (HCC)    Depression    Diabetes mellitus without complication (HCC)    Patient takes Janumet   Essential hypertension 05/26/2016   Heart failure (HCC) 2022   Hypercholesteremia    Oxygen dependent    2L at nite    PAD (peripheral artery disease) (HCC) 06/22/2016   Peripheral vascular disease (HCC)    Personal history of radiation therapy    Shortness of breath dyspnea    with exertion    Sialolithiasis    Sleep apnea    Wears dentures    full upper and lower    Past Surgical History:  Procedure Laterality Date   CESAREAN SECTION     x3   COLONOSCOPY WITH PROPOFOL N/A 06/25/2015   Procedure: COLONOSCOPY WITH PROPOFOL;  Surgeon: Midge Minium, MD;  Location: [facility];  Service: Endoscopy;  Laterality: N/A;   COLONOSCOPY WITH PROPOFOL N/A 07/26/2020   Procedure: COLONOSCOPY WITH PROPOFOL;  Surgeon: Midge Minium, MD;  Location: [facility];  Service: Endoscopy;  Laterality: N/A;   CYST REMOVAL LEG     and on shoulder    ESOPHAGOGASTRODUODENOSCOPY (EGD) WITH PROPOFOL N/A 07/26/2020   Procedure: ESOPHAGOGASTRODUODENOSCOPY (EGD) WITH PROPOFOL;  Surgeon: Midge Minium, MD;  Location: [facility];  Service: Endoscopy;  Laterality: N/A;  Diabetic - oral meds   LOWER EXTREMITY ANGIOGRAPHY Left 09/29/2018   Procedure: LOWER EXTREMITY ANGIOGRAPHY;  Surgeon: Annice Needy, MD;  Location: [facility];  Service: Cardiovascular;  Laterality: Left;   LUNG BIOPSY  12/30/2011   has lung "spots"   PACEMAKER IMPLANT  07/14/2021   PACEMAKER LEADLESS INSERTION N/A 07/14/2021   Procedure: PACEMAKER LEADLESS INSERTION;  Surgeon: Marcina Millard, MD;  Location: [facility];  Service: Cardiovascular;  Laterality: N/A;   PERIPHERAL VASCULAR CATHETERIZATION Left 06/01/2016   Procedure: Lower Extremity Angiography;  Surgeon: Annice Needy, MD;  Location: [facility];  Service:  Cardiovascular;  Laterality: Left;   PERIPHERAL VASCULAR CATHETERIZATION N/A 06/01/2016   Procedure: Abdominal Aortogram w/Lower Extremity;  Surgeon: Annice Needy, MD;  Location: [facility];  Service: Cardiovascular;  Laterality: N/A;   PERIPHERAL VASCULAR CATHETERIZATION  06/01/2016   Procedure: Lower Extremity Intervention;  Surgeon: Annice Needy, MD;  Location: [facility];  Service: Cardiovascular;;   PERIPHERAL VASCULAR CATHETERIZATION Right 06/08/2016   Procedure: Lower Extremity Angiography;  Surgeon: Annice Needy, MD;  Location: [facility];  Service: Cardiovascular;  Laterality: Right;  PERIPHERAL VASCULAR CATHETERIZATION  06/08/2016   Procedure: Lower Extremity Intervention;  Surgeon: Annice Needy, MD;  Location: [facility];  Service: Cardiovascular;;   SUBMANDIBULAR GLAND EXCISION Left 12/06/2020   Procedure: EXCISION SUBMANDIBULAR GLAND;  Surgeon: Linus Salmons, MD;  Location: [facility];  Service: ENT;  Laterality: Left;  needs to be first case Diabetic - diet controlled   TEMPORARY PACEMAKER N/A 07/11/2021   Procedure: TEMPORARY PACEMAKER;  Surgeon: Marcina Millard, MD;  Location: [facility];  Service: Cardiovascular;  Laterality: N/A;    Family History  Problem Relation Age of Onset   Diabetes Mother    Hypercholesterolemia Mother    Lung cancer Father    Diabetes Sister    Diabetes Sister    Hypertension Sister    Diabetes Maternal Grandmother    Diabetes Paternal Grandmother    Heart attack Brother    Coronary artery disease Brother    Vascular Disease Brother    Heart attack Brother    Breast cancer Neg Hx     Social History:  reports that she quit smoking about 13 years ago. Her smoking use included cigarettes. She started smoking about 50 years ago. She has a 37 pack-year smoking history. She has quit using smokeless tobacco.  Her smokeless tobacco use included snuff. She reports that she does not  currently use alcohol after a past usage of about 5.0 standard drinks of alcohol per week. She reports that she does not currently use drugs after having used the following drugs: Marijuana, "Crack" cocaine, and Cocaine.   Allergies:  Allergies  Allergen Reactions   Trelegy Ellipta [Fluticasone-Umeclidin-Vilant]     Had breathing issues    Current Medications: Current Outpatient Medications  Medication Sig Dispense Refill   Accu-Chek Softclix Lancets lancets Use 1 lancet 3 times daily to check glucose for diabetes 300 each 1   albuterol (VENTOLIN HFA) 108 (90 Base) MCG/ACT inhaler INHALE 2 PUFFS BY MOUTH EVERY 6 HOURS AS NEEDED FOR WHEEZING OR SHORTNESS OF BREATH 9 g 11   ALPRAZolam (XANAX) 0.25 MG tablet TAKE 1 TABLET BY MOUTH THREE TIMES DAILY AS NEEDED FOR ANXIETY 60 tablet 0   Blood Glucose Monitoring Suppl (ACCU-CHEK GUIDE) w/Device KIT Use as directed Dx e11.65 1 kit 0   calcium-vitamin D (OSCAL WITH D) 500-5 MG-MCG tablet Take 1 tablet by mouth 2 (two) times daily. 100 tablet 0   clopidogrel (PLAVIX) 75 MG tablet Take 1 tablet (75 mg total) by mouth daily. 90 tablet 3   Continuous Blood Gluc Receiver (DEXCOM G7 RECEIVER) DEVI Use one as directed for uncontrolled dm 1 each 0   Continuous Blood Gluc Sensor (DEXCOM G7 SENSOR) MISC Apply 1 sensor to skin on designated areas every 10 days to monitor glucose levels with sensor or smart phone app 3 each 11   cyclobenzaprine (FLEXERIL) 10 MG tablet Take 1 tablet (10 mg total) by mouth at bedtime. Take one tab po qhs for back spasm prn only 30 tablet 3   diclofenac (VOLTAREN) 75 MG EC tablet Take 1 tablet (75 mg total) by mouth 2 (two) times daily. 60 tablet 2   escitalopram (LEXAPRO) 10 MG tablet Take 1 tablet (10 mg total) by mouth daily. 90 tablet 1   famotidine (PEPCID) 20 MG tablet Take 1 tablet (20 mg total) by mouth daily for 14 days. 14 tablet 0   ferrous sulfate 325 (65 FE) MG tablet Take 325 mg by mouth 2 (two) times daily with a  meal.     fluticasone (FLONASE) 50 MCG/ACT nasal spray Place 2 sprays into both nostrils daily. 9.9 mL 2   furosemide (LASIX) 20 MG tablet Take 1 tablet (20 mg total) by mouth daily as needed. 30 tablet 5   gabapentin (NEURONTIN) 100 MG capsule Take by mouth.     gabapentin (NEURONTIN) 300 MG capsule Take 1 capsule (300 mg total) by mouth 3 (three) times daily. 90 capsule 3   glucose blood (ACCU-CHEK GUIDE) test strip USE STRIP TO CHECK BLOOD SUGAR FIRST THING IN THE MORNING, BEFORE MEALS AND AT BEDTIME. (A TOTAL OF 5 TIMES PER DAY) 450 each 0   insulin detemir (LEVEMIR FLEXTOUCH) 100 UNIT/ML FlexPen Inject 15 Units into the skin daily with supper. 6 mL 11   ipratropium-albuterol (DUONEB) 0.5-2.5 (3) MG/3ML SOLN Take 3 mLs by nebulization every 4 (four) hours as needed. 360 mL 3   lisinopril-hydrochlorothiazide (ZESTORETIC) 20-25 MG tablet Take 1 tablet by mouth daily.     metFORMIN (GLUCOPHAGE) 500 MG tablet TAKE 2 TABLETS BY MOUTH ONCE DAILY WITH BREAKFAST 180 tablet 0   metoprolol tartrate (LOPRESSOR) 25 MG tablet Take 0.5 tablets (12.5 mg total) by mouth 2 (two) times daily. 90 tablet 1   mometasone-formoterol (DULERA) 200-5 MCG/ACT AERO Inhale 2 puffs into the lungs 2 (two) times daily. 13 g 11   Omega-3 Fatty Acids (FISH OIL) 1000 MG CAPS Take 1 capsule by mouth daily.     OXYGEN Inhale 4 L into the lungs. PT USES [facility] FOR OXYGEN     potassium chloride (KLOR-CON) 10 MEQ tablet Take 1 tablet (10 mEq total) by mouth every other day. 45 tablet 2   predniSONE (DELTASONE) 10 MG tablet Take one tab 3 x day for 3 days, then take one tab 2 x a day for 3 days and then take one tab a day for 3 days for copd 18 tablet 0   RELION PEN NEEDLES 31G X 6 MM MISC USE 1 PEN NEEDLE WITH INSULIN PEN ONCE DAILY FOR DIABETES 50 each 0   SPIRIVA HANDIHALER 18 MCG inhalation capsule INHALE THE CONTENTS OF 1 CAPSULES BY MOUTH ONCE DAILY - DO NOT SWALLOW CAPSULES 90 capsule 3   traMADol (ULTRAM) 50 MG tablet  Take 1 tablet (50 mg total) by mouth every 12 (twelve) hours as needed. 90 tablet 0   No current facility-administered medications for this visit.    Review of Systems  Constitutional:  Negative for chills, diaphoresis, fever, malaise/fatigue and weight loss.  HENT:  Negative for congestion, ear discharge, ear pain, hearing loss, nosebleeds, sinus pain, sore throat and tinnitus.   Eyes: Negative.  Negative for blurred vision.  Respiratory:  Negative for cough, hemoptysis, sputum production and shortness of breath.        Patient is on nasal cannula oxygen  Cardiovascular:  Negative for chest pain, palpitations and leg swelling.  Gastrointestinal: Negative.  Negative for abdominal pain, blood in stool, constipation, diarrhea, heartburn, melena, nausea and vomiting.  Genitourinary: Negative.  Negative for dysuria, frequency, hematuria and urgency.  Musculoskeletal:  Negative for back pain, joint pain, myalgias and neck pain.  Skin: Negative.  Negative for itching and rash.  Neurological: Negative.  Negative for dizziness, tingling, sensory change, speech change, focal weakness, weakness and headaches.  Endo/Heme/Allergies: Negative.  Does not bruise/bleed easily.       Diabetes, under good control  Psychiatric/Behavioral: Negative.  Negative for depression and memory loss. The patient is not nervous/anxious and does not  have insomnia.   All other systems reviewed and are negative.   Performance status (ECOG): 0-1  Vitals:  Blood pressure (!) 167/85, pulse (!) 110, temperature 98.1 F (36.7 C), temperature source Tympanic, resp. rate 18, weight 123 lb 11.2 oz (56.1 kg), SpO2 95%, peak flow (!) 3 L/min. Physical Exam Constitutional:      General: She is not in acute distress.    Appearance: She is not diaphoretic.  HENT:     Head: Normocephalic and atraumatic.  Eyes:     General: No scleral icterus. Cardiovascular:     Rate and Rhythm: Normal rate.  Pulmonary:     Effort: Pulmonary  effort is normal. No respiratory distress.     Comments: Decreased breath sound bilaterally. Breathing is comfortable on nasal cannula oxygen Abdominal:     General: There is no distension.  Musculoskeletal:        General: Normal range of motion.     Cervical back: Normal range of motion and neck supple.  Skin:    Findings: No erythema.  Neurological:     Mental Status: She is alert and oriented to person, place, and time. Mental status is at baseline.     Cranial Nerves: No cranial nerve deficit.     Motor: No abnormal muscle tone.  Psychiatric:        Mood and Affect: Mood and affect normal.      RADIOGRAPHIC STUDIES: I have personally reviewed the radiological images as listed and agreed with the findings in the report. CT Chest W Contrast  Result Date: 03/29/2023 CLINICAL DATA:  Lung cancer.  * Tracking Code: BO * EXAM: CT CHEST WITH CONTRAST TECHNIQUE: Multidetector CT imaging of the chest was performed during intravenous contrast administration. RADIATION DOSE REDUCTION: This exam was performed according to the departmental dose-optimization program which includes automated exposure control, adjustment of the mA and/or kV according to patient size and/or use of iterative reconstruction technique. CONTRAST:  75mL OMNIPAQUE IOHEXOL 300 MG/ML  SOLN COMPARISON:  09/21/2022. FINDINGS: Cardiovascular: Atherosclerotic calcification of the aorta, aortic valve and coronary arteries. Heart is at the upper limits of normal in size to mildly enlarged. No pericardial effusion. Mediastinum/Nodes: Thoracic inlet lymph nodes are not enlarged by CT size criteria and appear similar. Mediastinal lymph nodes measure up to 8 mm in AP window, also stable. No mediastinal, hilar or axillary adenopathy. Esophagus is grossly unremarkable. Lungs/Pleura: Centrilobular emphysema. Scarring in the apical segment right upper lobe. New apical right upper lobe nodule measures 4 mm (3/29). Post radiation architectural  distortion in the lateral segment right middle lobe and right lower lobe. Chronically stable 5 mm left upper lobe nodule (3/40). No pleural fluid. Airway is unremarkable. Upper Abdomen: Visualized portions of the liver, gallbladder, adrenal glands, kidneys, spleen, pancreas, stomach and bowel are grossly unremarkable. No upper abdominal adenopathy. Musculoskeletal: Degenerative changes in the spine. Similar pathologic right rib fractures. No worrisome lytic or sclerotic lesions. IMPRESSION: 1. New 4 mm apical segment right upper lobe nodule, worrisome for disease progression. 2. Pathologic right rib fractures, as before, possibly radiation related. 3. Aortic atherosclerosis (ICD10-I70.0). Coronary artery calcification. 4.  Emphysema (ICD10-J43.9). Electronically Signed   By: Leanna Battles M.D.   On: 03/29/2023 10:32

## 2023-03-31 NOTE — Assessment & Plan Note (Addendum)
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 -Recurrence-SBRT completed 06/11/2020.NGS showed TMB >10 muts/mb, MS stable, KRAS G12V, MYC amplification,STK11 S261fs*47, FUBP1 splice sit 473+1G>T August 2024 CT scan results reviewed and discussed with patient New 4mm lung nodule apical segment right upper lobe nodule. Nodule is too small for PET evaluation or biopsy.  Recommend attention on follow up  Recommend continued surveillance CT scan in 6 months.

## 2023-03-31 NOTE — Assessment & Plan Note (Signed)
Stable.  Continue oxygen supplementation.

## 2023-03-31 NOTE — Assessment & Plan Note (Addendum)
Hemoglobin has decreased, check B12, iron tibc ferritin, LDH, haptoglobin, retic panel, folate, myeloma panel, light chain ratio.

## 2023-04-01 LAB — KAPPA/LAMBDA LIGHT CHAINS
Kappa free light chain: 18 mg/L (ref 3.3–19.4)
Kappa, lambda light chain ratio: 0.92 (ref 0.26–1.65)
Lambda free light chains: 19.5 mg/L (ref 5.7–26.3)

## 2023-04-01 LAB — HAPTOGLOBIN: Haptoglobin: 107 mg/dL (ref 37–355)

## 2023-04-04 LAB — MULTIPLE MYELOMA PANEL, SERUM
Albumin SerPl Elph-Mcnc: 3.7 g/dL (ref 2.9–4.4)
Albumin/Glob SerPl: 1.4 (ref 0.7–1.7)
Alpha 1: 0.2 g/dL (ref 0.0–0.4)
Alpha2 Glob SerPl Elph-Mcnc: 0.7 g/dL (ref 0.4–1.0)
B-Globulin SerPl Elph-Mcnc: 1.1 g/dL (ref 0.7–1.3)
Gamma Glob SerPl Elph-Mcnc: 0.7 g/dL (ref 0.4–1.8)
Globulin, Total: 2.7 g/dL (ref 2.2–3.9)
IgA: 128 mg/dL (ref 87–352)
IgG (Immunoglobin G), Serum: 852 mg/dL (ref 586–1602)
IgM (Immunoglobulin M), Srm: 97 mg/dL (ref 26–217)
M Protein SerPl Elph-Mcnc: 0.2 g/dL — ABNORMAL HIGH
Total Protein ELP: 6.4 g/dL (ref 6.0–8.5)

## 2023-04-06 ENCOUNTER — Other Ambulatory Visit: Payer: Self-pay | Admitting: Oncology

## 2023-04-06 ENCOUNTER — Encounter: Payer: Self-pay | Admitting: Oncology

## 2023-04-06 DIAGNOSIS — R0602 Shortness of breath: Secondary | ICD-10-CM | POA: Diagnosis not present

## 2023-04-06 DIAGNOSIS — I10 Essential (primary) hypertension: Secondary | ICD-10-CM | POA: Diagnosis not present

## 2023-04-06 DIAGNOSIS — J449 Chronic obstructive pulmonary disease, unspecified: Secondary | ICD-10-CM | POA: Diagnosis not present

## 2023-04-06 DIAGNOSIS — D472 Monoclonal gammopathy: Secondary | ICD-10-CM | POA: Insufficient documentation

## 2023-04-07 ENCOUNTER — Telehealth: Payer: Self-pay

## 2023-04-07 NOTE — Telephone Encounter (Signed)
-----   Message from Rickard Patience sent at 04/06/2023  9:49 PM EDT ----- Blood work showed iron deficiency which may attribute to her anemia.  Please check if she has been on oral iron, iron is on her medication list.  If she has been on iron supplementation, I recommend IV venofer weekly x 3 Recommend to move up her next appt from Feb 2025 to 3 months, lab prior to MD + venofer  cbc iron tibc ferritin retic panel, B12

## 2023-04-07 NOTE — Telephone Encounter (Signed)
Called pt x2. Unable to reach pt.

## 2023-04-08 NOTE — Telephone Encounter (Signed)
Call made to pt this morning. No answer

## 2023-04-12 ENCOUNTER — Encounter: Payer: Self-pay | Admitting: Nurse Practitioner

## 2023-04-12 ENCOUNTER — Ambulatory Visit (INDEPENDENT_AMBULATORY_CARE_PROVIDER_SITE_OTHER): Payer: 59 | Admitting: Nurse Practitioner

## 2023-04-12 VITALS — BP 130/76 | HR 77 | Temp 97.6°F | Resp 16 | Ht 59.0 in | Wt 133.4 lb

## 2023-04-12 DIAGNOSIS — I10 Essential (primary) hypertension: Secondary | ICD-10-CM

## 2023-04-12 DIAGNOSIS — J449 Chronic obstructive pulmonary disease, unspecified: Secondary | ICD-10-CM | POA: Diagnosis not present

## 2023-04-12 DIAGNOSIS — Z760 Encounter for issue of repeat prescription: Secondary | ICD-10-CM

## 2023-04-12 DIAGNOSIS — F419 Anxiety disorder, unspecified: Secondary | ICD-10-CM

## 2023-04-12 DIAGNOSIS — Z111 Encounter for screening for respiratory tuberculosis: Secondary | ICD-10-CM | POA: Diagnosis not present

## 2023-04-12 DIAGNOSIS — E1169 Type 2 diabetes mellitus with other specified complication: Secondary | ICD-10-CM

## 2023-04-12 DIAGNOSIS — E1165 Type 2 diabetes mellitus with hyperglycemia: Secondary | ICD-10-CM

## 2023-04-12 DIAGNOSIS — Z794 Long term (current) use of insulin: Secondary | ICD-10-CM | POA: Diagnosis not present

## 2023-04-12 DIAGNOSIS — E785 Hyperlipidemia, unspecified: Secondary | ICD-10-CM

## 2023-04-12 LAB — POCT GLYCOSYLATED HEMOGLOBIN (HGB A1C): Hemoglobin A1C: 5.4 % (ref 4.0–5.6)

## 2023-04-12 MED ORDER — METOPROLOL SUCCINATE ER 25 MG PO TB24: 25.0000 mg | ORAL_TABLET | Freq: Every day | ORAL | 3 refills | Status: DC

## 2023-04-12 MED ORDER — DICLOFENAC SODIUM 75 MG PO TBEC
75.0000 mg | DELAYED_RELEASE_TABLET | Freq: Two times a day (BID) | ORAL | 2 refills | Status: DC
Start: 2023-04-12 — End: 2023-04-22

## 2023-04-12 MED ORDER — ALPRAZOLAM 0.25 MG PO TABS
ORAL_TABLET | ORAL | 2 refills | Status: DC
Start: 2023-04-12 — End: 2023-07-11

## 2023-04-12 NOTE — Progress Notes (Signed)
[facility] [address] [place], [place] [address]  Internal MEDICINE  Office Visit Note  Patient Name: Angela Jensen  [id number]  [id number]  Date of Service: 04/12/2023  Chief Complaint  Patient presents with   Depression   Diabetes   Hypertension   Hyperlipidemia   Follow-up    HPI Angela Jensen presents for a follow-up visit for diabetes and hypertension  Trigger fingers -- came back about 2-3 months ago, wants to be referred again, may not need a referral, is previously established with [facility] orthopedic.  BP fluctuating -- on 1/2 tab twice daily of metoprolol tartrate.  Diabetes -- A1c improved to 5.4 which is normal. Taking metformin and levemir.   Requesting to be screened for tuberculosis.     Current Medication: Outpatient Encounter Medications as of 04/12/2023  Medication Sig   Accu-Chek Softclix Lancets lancets Use 1 lancet 3 times daily to check glucose for diabetes   albuterol (VENTOLIN HFA) 108 (90 Base) MCG/ACT inhaler INHALE 2 PUFFS BY MOUTH EVERY 6 HOURS AS NEEDED FOR WHEEZING OR SHORTNESS OF BREATH   Blood Glucose Monitoring Suppl (ACCU-CHEK GUIDE) w/Device KIT Use as directed Dx e11.65   calcium-vitamin D (OSCAL WITH D) 500-5 MG-MCG tablet Take 1 tablet by mouth 2 (two) times daily.   clopidogrel (PLAVIX) 75 MG tablet Take 1 tablet (75 mg total) by mouth daily.   Continuous Blood Gluc Receiver (DEXCOM G7 RECEIVER) DEVI Use one as directed for uncontrolled dm   Continuous Blood Gluc Sensor (DEXCOM G7 SENSOR) MISC Apply 1 sensor to skin on designated areas every 10 days to monitor glucose levels with sensor or smart phone app   cyclobenzaprine (FLEXERIL) 10 MG tablet Take 1 tablet (10 mg total) by mouth at bedtime. Take one tab po qhs for back spasm prn only   diclofenac (VOLTAREN) 75 MG EC tablet Take 1 tablet (75 mg total) by mouth 2 (two) times daily.   escitalopram (LEXAPRO) 10 MG tablet Take 1 tablet (10 mg total) by mouth daily.   ferrous sulfate  325 (65 FE) MG tablet Take 325 mg by mouth 2 (two) times daily with a meal.   fluticasone (FLONASE) 50 MCG/ACT nasal spray Place 2 sprays into both nostrils daily.   furosemide (LASIX) 20 MG tablet Take 1 tablet (20 mg total) by mouth daily as needed.   gabapentin (NEURONTIN) 100 MG capsule Take by mouth.   gabapentin (NEURONTIN) 300 MG capsule Take 1 capsule (300 mg total) by mouth 3 (three) times daily.   glucose blood (ACCU-CHEK GUIDE) test strip USE TO TEST BLOOD SUGARS 5 TIMES DAILY   insulin detemir (LEVEMIR FLEXTOUCH) 100 UNIT/ML FlexPen Inject 15 Units into the skin daily with supper.   ipratropium-albuterol (DUONEB) 0.5-2.5 (3) MG/3ML SOLN Take 3 mLs by nebulization every 4 (four) hours as needed.   lisinopril-hydrochlorothiazide (ZESTORETIC) 20-25 MG tablet Take 1 tablet by mouth daily.   metFORMIN (GLUCOPHAGE) 500 MG tablet TAKE 2 TABLETS BY MOUTH ONCE DAILY WITH BREAKFAST   metoprolol succinate (TOPROL-XL) 25 MG 24 hr tablet Take 1 tablet (25 mg total) by mouth daily.   mometasone-formoterol (DULERA) 200-5 MCG/ACT AERO Inhale 2 puffs into the lungs 2 (two) times daily.   Omega-3 Fatty Acids (FISH OIL) 1000 MG CAPS Take 1 capsule by mouth daily.   OXYGEN Inhale 4 L into the lungs. PT USES [facility] FOR OXYGEN   potassium chloride (KLOR-CON) 10 MEQ tablet Take 1 tablet (10 mEq total) by mouth every other day.  predniSONE (DELTASONE) 10 MG tablet Take one tab 3 x day for 3 days, then take one tab 2 x a day for 3 days and then take one tab a day for 3 days for copd   RELION PEN NEEDLES 31G X 6 MM MISC USE 1 PEN NEEDLE WITH INSULIN PEN ONCE DAILY FOR DIABETES   SPIRIVA HANDIHALER 18 MCG inhalation capsule INHALE THE CONTENTS OF 1 CAPSULES BY MOUTH ONCE DAILY - DO NOT SWALLOW CAPSULES   traMADol (ULTRAM) 50 MG tablet Take 1 tablet (50 mg total) by mouth every 12 (twelve) hours as needed.   [DISCONTINUED] ALPRAZolam (XANAX) 0.25 MG tablet TAKE 1 TABLET BY MOUTH THREE TIMES DAILY AS NEEDED  FOR ANXIETY   [DISCONTINUED] metoprolol tartrate (LOPRESSOR) 25 MG tablet Take 0.5 tablets (12.5 mg total) by mouth 2 (two) times daily.   ALPRAZolam (XANAX) 0.25 MG tablet TAKE 1 TABLET BY MOUTH THREE TIMES DAILY AS NEEDED FOR ANXIETY   famotidine (PEPCID) 20 MG tablet Take 1 tablet (20 mg total) by mouth daily for 14 days.   No facility-administered encounter medications on file as of 04/12/2023.    Surgical History: Past Surgical History:  Procedure Laterality Date   CESAREAN SECTION     x3   COLONOSCOPY WITH PROPOFOL N/A 06/25/2015   Procedure: COLONOSCOPY WITH PROPOFOL;  Surgeon: Midge Minium, MD;  Location: [facility] ENDOSCOPY;  Service: Endoscopy;  Laterality: N/A;   COLONOSCOPY WITH PROPOFOL N/A 07/26/2020   Procedure: COLONOSCOPY WITH PROPOFOL;  Surgeon: Midge Minium, MD;  Location: [facility];  Service: Endoscopy;  Laterality: N/A;   CYST REMOVAL LEG     and on shoulder    ESOPHAGOGASTRODUODENOSCOPY (EGD) WITH PROPOFOL N/A 07/26/2020   Procedure: ESOPHAGOGASTRODUODENOSCOPY (EGD) WITH PROPOFOL;  Surgeon: Midge Minium, MD;  Location: [facility];  Service: Endoscopy;  Laterality: N/A;  Diabetic - oral meds   LOWER EXTREMITY ANGIOGRAPHY Left 09/29/2018   Procedure: LOWER EXTREMITY ANGIOGRAPHY;  Surgeon: Annice Needy, MD;  Location: [facility] INVASIVE CV LAB;  Service: Cardiovascular;  Laterality: Left;   LUNG BIOPSY  12/30/2011   has lung "spots"   PACEMAKER IMPLANT  07/14/2021   PACEMAKER LEADLESS INSERTION N/A 07/14/2021   Procedure: PACEMAKER LEADLESS INSERTION;  Surgeon: Marcina Millard, MD;  Location: [facility] INVASIVE CV LAB;  Service: Cardiovascular;  Laterality: N/A;   PERIPHERAL VASCULAR CATHETERIZATION Left 06/01/2016   Procedure: Lower Extremity Angiography;  Surgeon: Annice Needy, MD;  Location: [facility] INVASIVE CV LAB;  Service: Cardiovascular;  Laterality: Left;   PERIPHERAL VASCULAR CATHETERIZATION N/A 06/01/2016   Procedure: Abdominal Aortogram w/Lower  Extremity;  Surgeon: Annice Needy, MD;  Location: [facility] INV[facility] CV LAB;  Service: Cardiovascular;  Laterality: N/A;   PERIPHERAL VASCULAR CATHETERIZATION  06/01/2016   Procedure: Lower Extremity Intervention;  Surgeon: Annice Needy, MD;  Location: [facility] INV[facility] CV LAB;  Service: Cardiovascular;;   PERIPHERAL VASCULAR CATHETERIZATION Right 06/08/2016   Procedure: Lower Extremity Angiography;  Surgeon: Annice Needy, MD;  Location: [facility] INV[facility] CV [facility];  Service: Cardiovascular;  Laterality: Right;   PERIPHERAL VASCULAR CATHETERIZATION  06/08/2016   Procedure: Lower Extremity Intervention;  Surgeon: Annice Needy, MD;  Location: [facility] INVAS[facility] CV LAB;  Service: Cardiovascular;;   SUBMANDIBULAR GLAND EXCISION Left 12/06/2020   Procedure: EXCISION SUBMANDIBULAR GLAND;  Surgeon: Linus Salmons, MD;  Location: [facility];  Service: ENT;  Laterality: Left;  needs to be first case Diabetic - diet controlled   TEMPORARY PACEMAKER N/A 07/11/2021   Procedure: TEMPORARY PACEMAKER;  Surgeon: Marcina Millard, MD;  Location: [facility] INVAS[facility] CV LAB;  Service: Cardiovascular;  Laterality: N/A;    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/2016   Cancer (HCC) 2012   Right Lung CA   COPD (chronic obstructive pulmonary disease) (HCC)    Depression    Diabetes mellitus without complication (HCC)    Patient takes Janumet   Essential hypertension 05/26/2016   Heart failure (HCC) 2022   Hypercholesteremia    Oxygen dependent    2L at nite    PAD (peripheral artery disease) (HCC) 06/22/2016   Peripheral vascular disease (HCC)    Personal history of radiation therapy    Shortness of breath dyspnea    with exertion    Sialolithiasis    Sleep apnea    Wears dentures    full upper and lower    Family History: Family History  Problem Relation Age of Onset    Diabetes Mother    Hypercholesterolemia Mother    Lung cancer Father    Diabetes Sister    Diabetes Sister    Hypertension Sister    Diabetes Maternal Grandmother    Diabetes Paternal Grandmother    Heart attack Brother    Coronary artery disease Brother    Vascular Disease Brother    Heart attack Brother    Breast cancer Neg Hx     Social History   Socioeconomic History   Marital status: Widowed    Spouse name: Not on file   Number of children: Not on file   Years of education: Not on file   Highest education level: Not on file  Occupational History   Not on file  Tobacco Use   Smoking status: Former    Current packs/day: 0.00    Average packs/day: 1 pack/day for 37.0 years (37.0 ttl pk-yrs)    Types: Cigarettes    Start date: 02/06/1973    Quit date: 02/06/2010    Years since quitting: 13.1   Smokeless tobacco: Former    Types: Snuff  Vaping Use   Vaping status: Never Used  Substance and Sexual Activity   Alcohol use: Not Currently    Alcohol/week: 5.0 standard drinks of alcohol    Types: 5 Cans of beer per week    Comment: /h x of alcohol abuse -stopped 2012- now drinks 5 beer per week   Drug use: Not Currently    Types: Marijuana, "Crack" cocaine, Cocaine    Comment: hx of cocaine use- last use 2015; last use marijuana6/22/19,   Sexual activity: Yes  Other Topics Concern   Not on file  Social History Narrative   Lives with Significant Other x 43 years   Social Determinants of Health   Financial Resource Strain: Not on file  Food Insecurity: No Food Insecurity (11/19/2022)   Hunger Vital Sign    Worried About Running Out of Food in the Last Year: Never true    Ran Out of Food in the Last Year: Never true  Transportation Needs: No Transportation Needs (11/19/2022)   PRAPARE - Administrator, Civil Service (Medical): No    Lack of Transportation (Non-Medical): No  Physical Activity: Not on file  Stress: Not on file  Social Connections: Not on file   Intimate Partner Violence: Not At Risk (11/19/2022)   Humiliation, Afraid, Rape, and Kick questionnaire  Fear of Current or Ex-Partner: No    Emotionally Abused: No    Physically Abused: No    Sexually Abused: No      Review of Systems  Constitutional:  Negative for chills, fatigue and unexpected weight change.  HENT:  Negative for congestion, rhinorrhea, sneezing and sore throat.   Eyes:  Negative for redness.  Respiratory: Negative.  Negative for cough, chest tightness, shortness of breath and wheezing.   Cardiovascular: Negative.  Negative for chest pain and palpitations.  Gastrointestinal: Negative.  Negative for abdominal pain, constipation, diarrhea, nausea and vomiting.  Genitourinary:  Negative for dysuria and frequency.  Musculoskeletal:  Positive for arthralgias and back pain. Negative for joint swelling and neck pain.  Skin:  Negative for rash.  Neurological: Negative.  Negative for tremors and numbness.  Hematological:  Negative for adenopathy. Does not bruise/bleed easily.  Psychiatric/Behavioral:  Negative for behavioral problems (Depression), sleep disturbance and suicidal ideas. The patient is not nervous/anxious.     Vital Signs: BP 130/76   Pulse 77   Temp 97.6 F (36.4 C)   Resp 16   Ht 4\' 11"  (1.499 m)   Wt 133 lb 6.4 oz (60.5 kg)   SpO2 94% Comment: 3L  BMI 26.94 kg/m    Physical Exam Vitals reviewed.  Constitutional:      General: She is not in acute distress.    Appearance: Normal appearance. She is normal weight. She is not ill-appearing.  HENT:     Head: Normocephalic and atraumatic.  Eyes:     Pupils: Pupils are equal, round, and reactive to light.  Cardiovascular:     Rate and Rhythm: Normal rate and regular rhythm.  Pulmonary:     Effort: Pulmonary effort is normal. No respiratory distress.  Neurological:     Mental Status: She is alert and oriented to person, place, and time.  Psychiatric:        Mood and Affect: Mood normal.         Behavior: Behavior normal.        Assessment/Plan: 1. Type 2 diabetes mellitus with other specified complication, with long-term current use of insulin (HCC) A1c improved to normal range. Continue medications as prescribed.  - POCT glycosylated hemoglobin (Hb A1C)  2. Hyperlipidemia associated with type 2 diabetes mellitus (HCC) Taking fish oil supplement OTC. Has been on atorvastatin and rosuvastatin. Currently not on prescription medication for cholesterol per patient preference. Will see how her next set of labs looks.  3. Essential hypertension Metoprolol changed to succinate which is extended release. Follow up in 4 weeks to reassess   - metoprolol succinate (TOPROL-XL) 25 MG 24 hr tablet; Take 1 tablet (25 mg total) by mouth daily.  Dispense: 30 tablet; Refill: 3  4. Chronic obstructive pulmonary disease, unspecified COPD type (HCC) Continue dulera and spiriva as prescribed   5. Screening for tuberculosis TB gold test ordered  - QuantiFERON-TB Gold Plus  6. Anxiety Continue prn alprazolam for anxiety as prescribed. Follow up in 3 months for additional refills.  - ALPRAZolam (XANAX) 0.25 MG tablet; TAKE 1 TABLET BY MOUTH THREE TIMES DAILY AS NEEDED FOR ANXIETY  Dispense: 60 tablet; Refill: 2   General Counseling: Manar verbalizes understanding of the findings of todays visit and agrees with plan of treatment. I have discussed any further diagnostic evaluation that may be needed or ordered today. We also reviewed her medications today. she has been encouraged to call the office with any questions or concerns that should arise  related to todays visit.    Orders Placed This Encounter  Procedures   QuantiFERON-TB Gold Plus   POCT glycosylated hemoglobin (Hb A1C)    Meds ordered this encounter  Medications   metoprolol succinate (TOPROL-XL) 25 MG 24 hr tablet    Sig: Take 1 tablet (25 mg total) by mouth daily.    Dispense:  30 tablet    Refill:  3    Discontinue  metoprolol tartrate and fill new script asap. Thanks   ALPRAZolam (XANAX) 0.25 MG tablet    Sig: TAKE 1 TABLET BY MOUTH THREE TIMES DAILY AS NEEDED FOR ANXIETY    Dispense:  60 tablet    Refill:  2    Return in about 4 weeks (around 05/10/2023) for F/U, eval new med, BP check, Raiya Stainback PCP.   Total time spent:30 Minutes Time spent includes review of chart, medications, test results, and follow up plan with the patient.   [place] Controlled Substance Database was reviewed by me.  This patient was seen by Sallyanne Kuster, FNP-C in collaboration with Dr. Beverely Risen as a part of collaborative care agreement.   Roxane [place] Tedd Sias, MSN, FNP-C Internal medicine

## 2023-04-13 ENCOUNTER — Telehealth: Payer: Self-pay | Admitting: Nurse Practitioner

## 2023-04-13 NOTE — Telephone Encounter (Signed)
Patient called regarding orthopedic referral sent out back in Feb. She stated she needed to schedule another appointment. I has her if she had been going to [facility] for appointments. She stated she had been going. I told her to call office to schedule an appt, she should not need another referral-Toni

## 2023-04-13 NOTE — Telephone Encounter (Signed)
Spoke to pt this morning and informed her of MD recommendations. She is taking oral iron supplement, but is agreeable to IV iron. Informed her that she may stop taking oral iron while receiving IV iron. Pt verbalized understanding and is aware of follow up plan.   Please scheulde and inform pt of appts:  Venofer weekly x3 Move up appts in Feb to Nov 2024. (labs 1-2 days prior to MD/venofer)

## 2023-04-16 ENCOUNTER — Inpatient Hospital Stay
Admission: EM | Admit: 2023-04-16 | Discharge: 2023-04-22 | DRG: 199 | Disposition: A | Discharge status: Home / Self Care | Payer: 59 | Attending: Internal Medicine | Admitting: Internal Medicine

## 2023-04-16 ENCOUNTER — Other Ambulatory Visit: Payer: Self-pay

## 2023-04-16 ENCOUNTER — Emergency Department: Payer: 59

## 2023-04-16 DIAGNOSIS — Z87891 Personal history of nicotine dependence: Secondary | ICD-10-CM

## 2023-04-16 DIAGNOSIS — D638 Anemia in other chronic diseases classified elsewhere: Secondary | ICD-10-CM | POA: Diagnosis present

## 2023-04-16 DIAGNOSIS — Z801 Family history of malignant neoplasm of trachea, bronchus and lung: Secondary | ICD-10-CM

## 2023-04-16 DIAGNOSIS — T797XXA Traumatic subcutaneous emphysema, initial encounter: Secondary | ICD-10-CM | POA: Diagnosis not present

## 2023-04-16 DIAGNOSIS — G4733 Obstructive sleep apnea (adult) (pediatric): Secondary | ICD-10-CM | POA: Diagnosis present

## 2023-04-16 DIAGNOSIS — I10 Essential (primary) hypertension: Secondary | ICD-10-CM | POA: Diagnosis present

## 2023-04-16 DIAGNOSIS — Z79899 Other long term (current) drug therapy: Secondary | ICD-10-CM

## 2023-04-16 DIAGNOSIS — Z8249 Family history of ischemic heart disease and other diseases of the circulatory system: Secondary | ICD-10-CM | POA: Diagnosis not present

## 2023-04-16 DIAGNOSIS — Z7902 Long term (current) use of antithrombotics/antiplatelets: Secondary | ICD-10-CM

## 2023-04-16 DIAGNOSIS — I2510 Atherosclerotic heart disease of native coronary artery without angina pectoris: Secondary | ICD-10-CM | POA: Diagnosis not present

## 2023-04-16 DIAGNOSIS — Z950 Presence of cardiac pacemaker: Secondary | ICD-10-CM | POA: Diagnosis not present

## 2023-04-16 DIAGNOSIS — Z833 Family history of diabetes mellitus: Secondary | ICD-10-CM

## 2023-04-16 DIAGNOSIS — M65312 Trigger thumb, left thumb: Secondary | ICD-10-CM | POA: Diagnosis not present

## 2023-04-16 DIAGNOSIS — J4489 Other specified chronic obstructive pulmonary disease: Secondary | ICD-10-CM | POA: Diagnosis present

## 2023-04-16 DIAGNOSIS — Z923 Personal history of irradiation: Secondary | ICD-10-CM | POA: Diagnosis not present

## 2023-04-16 DIAGNOSIS — E1165 Type 2 diabetes mellitus with hyperglycemia: Secondary | ICD-10-CM | POA: Diagnosis not present

## 2023-04-16 DIAGNOSIS — J441 Chronic obstructive pulmonary disease with (acute) exacerbation: Secondary | ICD-10-CM

## 2023-04-16 DIAGNOSIS — J9621 Acute and chronic respiratory failure with hypoxia: Secondary | ICD-10-CM | POA: Diagnosis not present

## 2023-04-16 DIAGNOSIS — E1151 Type 2 diabetes mellitus with diabetic peripheral angiopathy without gangrene: Secondary | ICD-10-CM | POA: Diagnosis not present

## 2023-04-16 DIAGNOSIS — R918 Other nonspecific abnormal finding of lung field: Secondary | ICD-10-CM | POA: Diagnosis not present

## 2023-04-16 DIAGNOSIS — E875 Hyperkalemia: Secondary | ICD-10-CM | POA: Diagnosis not present

## 2023-04-16 DIAGNOSIS — R0602 Shortness of breath: Secondary | ICD-10-CM | POA: Diagnosis not present

## 2023-04-16 DIAGNOSIS — J939 Pneumothorax, unspecified: Secondary | ICD-10-CM | POA: Diagnosis not present

## 2023-04-16 DIAGNOSIS — E7800 Pure hypercholesterolemia, unspecified: Secondary | ICD-10-CM | POA: Diagnosis not present

## 2023-04-16 DIAGNOSIS — Z95828 Presence of other vascular implants and grafts: Secondary | ICD-10-CM | POA: Diagnosis not present

## 2023-04-16 DIAGNOSIS — J948 Other specified pleural conditions: Secondary | ICD-10-CM | POA: Diagnosis present

## 2023-04-16 DIAGNOSIS — I110 Hypertensive heart disease with heart failure: Secondary | ICD-10-CM | POA: Diagnosis present

## 2023-04-16 DIAGNOSIS — Z9981 Dependence on supplemental oxygen: Secondary | ICD-10-CM

## 2023-04-16 DIAGNOSIS — Z83438 Family history of other disorder of lipoprotein metabolism and other lipidemia: Secondary | ICD-10-CM

## 2023-04-16 DIAGNOSIS — C3411 Malignant neoplasm of upper lobe, right bronchus or lung: Secondary | ICD-10-CM | POA: Diagnosis present

## 2023-04-16 DIAGNOSIS — M65311 Trigger thumb, right thumb: Secondary | ICD-10-CM | POA: Diagnosis not present

## 2023-04-16 DIAGNOSIS — Z85118 Personal history of other malignant neoplasm of bronchus and lung: Secondary | ICD-10-CM | POA: Diagnosis not present

## 2023-04-16 DIAGNOSIS — R0609 Other forms of dyspnea: Secondary | ICD-10-CM | POA: Diagnosis not present

## 2023-04-16 DIAGNOSIS — J90 Pleural effusion, not elsewhere classified: Secondary | ICD-10-CM | POA: Diagnosis not present

## 2023-04-16 DIAGNOSIS — J9382 Other air leak: Secondary | ICD-10-CM | POA: Diagnosis present

## 2023-04-16 DIAGNOSIS — Z7984 Long term (current) use of oral hypoglycemic drugs: Secondary | ICD-10-CM | POA: Diagnosis not present

## 2023-04-16 DIAGNOSIS — I5042 Chronic combined systolic (congestive) and diastolic (congestive) heart failure: Secondary | ICD-10-CM | POA: Diagnosis present

## 2023-04-16 DIAGNOSIS — R911 Solitary pulmonary nodule: Secondary | ICD-10-CM | POA: Diagnosis not present

## 2023-04-16 DIAGNOSIS — Z888 Allergy status to other drugs, medicaments and biological substances status: Secondary | ICD-10-CM

## 2023-04-16 DIAGNOSIS — Z515 Encounter for palliative care: Secondary | ICD-10-CM | POA: Diagnosis not present

## 2023-04-16 DIAGNOSIS — E119 Type 2 diabetes mellitus without complications: Secondary | ICD-10-CM

## 2023-04-16 DIAGNOSIS — Z794 Long term (current) use of insulin: Secondary | ICD-10-CM | POA: Diagnosis not present

## 2023-04-16 DIAGNOSIS — J432 Centrilobular emphysema: Secondary | ICD-10-CM | POA: Diagnosis not present

## 2023-04-16 DIAGNOSIS — F32A Depression, unspecified: Secondary | ICD-10-CM | POA: Diagnosis present

## 2023-04-16 DIAGNOSIS — Z7951 Long term (current) use of inhaled steroids: Secondary | ICD-10-CM

## 2023-04-16 DIAGNOSIS — I739 Peripheral vascular disease, unspecified: Secondary | ICD-10-CM | POA: Diagnosis present

## 2023-04-16 DIAGNOSIS — Z4682 Encounter for fitting and adjustment of non-vascular catheter: Secondary | ICD-10-CM | POA: Diagnosis not present

## 2023-04-16 LAB — COMPREHENSIVE METABOLIC PANEL
ALT: 20 U/L (ref 0–44)
AST: 23 U/L (ref 15–41)
Albumin: 3.9 g/dL (ref 3.5–5.0)
Alkaline Phosphatase: 59 U/L (ref 38–126)
Anion gap: 9 (ref 5–15)
BUN: 26 mg/dL — ABNORMAL HIGH (ref 8–23)
CO2: 28 mmol/L (ref 22–32)
Calcium: 8.9 mg/dL (ref 8.9–10.3)
Chloride: 104 mmol/L (ref 98–111)
Creatinine, Ser: 0.78 mg/dL (ref 0.44–1.00)
GFR, Estimated: >60 mL/min (ref >60)
Glucose, Bld: 111 mg/dL — ABNORMAL HIGH (ref 70–99)
Potassium: 4.2 mmol/L (ref 3.5–5.1)
Sodium: 141 mmol/L (ref 135–145)
Total Bilirubin: 0.4 mg/dL (ref 0.3–1.2)
Total Protein: 7.2 g/dL (ref 6.5–8.1)

## 2023-04-16 LAB — CBC WITH DIFFERENTIAL/PLATELET
Abs Immature Granulocytes: 0.02 10*3/uL (ref 0.00–0.07)
Basophils Absolute: 0 10*3/uL (ref 0.0–0.1)
Basophils Relative: 0 %
Eosinophils Absolute: 0.1 10*3/uL (ref 0.0–0.5)
Eosinophils Relative: 1 %
HCT: 31 % — ABNORMAL LOW (ref 36.0–46.0)
Hemoglobin: 8.8 g/dL — ABNORMAL LOW (ref 12.0–15.0)
Immature Granulocytes: 0 %
Lymphocytes Relative: 8 %
Lymphs Abs: 0.6 10*3/uL — ABNORMAL LOW (ref 0.7–4.0)
MCH: 28.9 pg (ref 26.0–34.0)
MCHC: 28.4 g/dL — ABNORMAL LOW (ref 30.0–36.0)
MCV: 101.6 fL — ABNORMAL HIGH (ref 80.0–100.0)
Monocytes Absolute: 0.5 10*3/uL (ref 0.1–1.0)
Monocytes Relative: 6 %
Neutro Abs: 6.7 10*3/uL (ref 1.7–7.7)
Neutrophils Relative %: 85 %
Platelets: 269 10*3/uL (ref 150–400)
RBC: 3.05 MIL/uL — ABNORMAL LOW (ref 3.87–5.11)
RDW: 14.9 % (ref 11.5–15.5)
WBC: 7.9 10*3/uL (ref 4.0–10.5)
nRBC: 0 % (ref 0.0–0.2)

## 2023-04-16 MED ORDER — SODIUM CHLORIDE 0.9% FLUSH
3.0000 mL | Freq: Two times a day (BID) | INTRAVENOUS | Status: DC
  Administered 2023-04-16 – 2023-04-22 (×11): 3 mL via INTRAVENOUS

## 2023-04-16 MED ORDER — ALPRAZOLAM 0.25 MG PO TABS
0.2500 mg | ORAL_TABLET | Freq: Three times a day (TID) | ORAL | Status: DC | PRN
  Administered 2023-04-18 – 2023-04-21 (×5): 0.25 mg via ORAL
  Filled 2023-04-16 (×5): qty 1

## 2023-04-16 MED ORDER — FLUTICASONE PROPIONATE 50 MCG/ACT NA SUSP: 2.0000 | Freq: Every day | NASAL | Status: DC | PRN

## 2023-04-16 MED ORDER — HYDROCODONE-ACETAMINOPHEN 5-325 MG PO TABS
1.0000 | ORAL_TABLET | ORAL | Status: DC | PRN
  Administered 2023-04-17 – 2023-04-21 (×9): 1 via ORAL
  Filled 2023-04-16 (×9): qty 1

## 2023-04-16 MED ORDER — TIOTROPIUM BROMIDE MONOHYDRATE 18 MCG IN CAPS
18.0000 ug | ORAL_CAPSULE | Freq: Every day | RESPIRATORY_TRACT | Status: DC
  Administered 2023-04-18 – 2023-04-22 (×5): 18 ug via RESPIRATORY_TRACT
  Filled 2023-04-16 (×2): qty 5

## 2023-04-16 MED ORDER — INSULIN ASPART 100 UNIT/ML IJ SOLN
0.0000 [IU] | INTRAMUSCULAR | Status: DC | PRN
  Administered 2023-04-22: 3 [IU] via SUBCUTANEOUS
  Filled 2023-04-16: qty 1

## 2023-04-16 MED ORDER — ACETAMINOPHEN 325 MG PO TABS
650.0000 mg | ORAL_TABLET | Freq: Four times a day (QID) | ORAL | Status: DC | PRN
  Administered 2023-04-16 – 2023-04-20 (×3): 650 mg via ORAL
  Filled 2023-04-16 (×4): qty 2

## 2023-04-16 MED ORDER — ACETAMINOPHEN 650 MG RE SUPP: 650.0000 mg | Freq: Four times a day (QID) | RECTAL | Status: DC | PRN

## 2023-04-16 MED ORDER — MORPHINE SULFATE (PF) 2 MG/ML IV SOLN
2.0000 mg | INTRAVENOUS | Status: DC | PRN
  Administered 2023-04-17 (×2): 2 mg via INTRAVENOUS
  Filled 2023-04-16 (×2): qty 1

## 2023-04-16 MED ORDER — CLOPIDOGREL BISULFATE 75 MG PO TABS
75.0000 mg | ORAL_TABLET | Freq: Every day | ORAL | Status: DC
  Administered 2023-04-17 – 2023-04-22 (×6): 75 mg via ORAL
  Filled 2023-04-16 (×6): qty 1

## 2023-04-16 NOTE — ED Notes (Signed)
Patient was assisted to the restroom (approx. 4 feet from stretcher, remained on O2) and became very short of breath and tachypneic after she was done. She was assisted back to the stretcher and placed on HFNC on 8 L/min. After her shortness of breath subsided, O2 was turned down to 4 L/min and maintaining oxygen saturations at 98%.

## 2023-04-16 NOTE — ED Provider Notes (Signed)
[facility] Provider Note    Event Date/Time   First MD Initiated Contact with Patient 04/16/23 1742     (approximate)  History   Chief Complaint: Shortness of Breath  HPI  Angela Jensen is a 67 y.o. female with a past medical history of anemia, arthritis, COPD, lung cancer, diabetes, hypertension, hyperlipidemia, presents to the emergency department for shortness of breath.  According to the patient for the past few days she has had progressively worsening shortness of breath.  Patient states she typically wears 4 L at home when she has been wearing, states she has been using her nebulizer treatments but they have not been helping so the patient came to the emergency department for evaluation.   Physical Exam   Triage Vital Signs: ED Triage Vitals  Encounter Vitals Group     BP 04/16/23 1514 121/80     Systolic BP Percentile --      Diastolic BP Percentile --      Pulse Rate 04/16/23 1514 95     Resp 04/16/23 1514 18     Temp 04/16/23 1514 98.1 F (36.7 C)     Temp Source 04/16/23 1514 Oral     SpO2 04/16/23 1514 97 %     Weight --      Height --      Head Circumference --      Peak Flow --      Pain Score 04/16/23 1516 0     Pain Loc --      Pain Education --      Exclude from Growth Chart --     Most recent vital signs: Vitals:   04/16/23 1514  BP: 121/80  Pulse: 95  Resp: 18  Temp: 98.1 F (36.7 C)  SpO2: 97%    General: Awake, no distress.  CV:  Good peripheral perfusion.  Regular rate and rhythm  Resp:  Normal effort.  Equal breath sounds bilaterally.  No obvious wheeze rales or rhonchi my exam Abd:  No distention.  Soft, nontender.  No rebound or guarding.  ED Results / Procedures / Treatments   EKG  EKG viewed and interpreted by myself shows a normal sinus rhythm at 90 bpm with a narrow QRS, normal axis, normal intervals, nonspecific ST changes.  RADIOLOGY  I have reviewed and interpreted chest x-ray images.  Patient has  2 opacities noted to the right lung field this could be due to the patient's known lung cancer.  I reviewed the patient's past chest x-ray from April 3 which shows these areas as well. Radiology has read the x-ray as a small apical and basilar pneumothorax.   MEDICATIONS ORDERED IN ED: Medications - No data to display   IMPRESSION / MDM / ASSESSMENT AND PLAN / ED COURSE  I reviewed the triage vital signs and the nursing notes.  Patient's presentation is most consistent with acute presentation with potential threat to life or bodily function.  Patient presents emergency department for 3 days of worsening shortness of breath.  Patient has been wearing her 4 L at home which is chronic but she feels that her shortness of breath has continued to worsen despite using oxygen and nebulizer treatments at home.  Patient's lab work today shows a reassuring CBC with mild anemia however the anemia appears chronic.  Chemistry is reassuring.  Patient's chest x-ray has resulted showing a small pneumothorax.  I spoke to the radiologist regarding this they are not able to quantify  how large it is but appears to be an apical as well as a basilar component.  Will obtain CT imaging to further evaluate.  Patient agreeable to plan of care.  Currently on 3 L nasal cannula satting 97% in the emergency department.  CT confirms moderate-sized right-sided pneumothorax.  I spoke with Dr. Maia Plan of surgery who believes that this is likely too small to blindly insert a chest tube.  I spoke to interventional radiology they state is reasonable to admit the patient to the hospital repeat the x-ray in the morning to decide if the patient needs an IR guided line which they would do tomorrow if needed.  I spoke to the hospitalist they will be admitting to their service.  Continues to sat 100% on 4 L which is her baseline O2 requirement.  FINAL CLINICAL IMPRESSION(S) / ED DIAGNOSES   Dyspnea Pneumothorax    Note:  This  document was prepared using Dragon voice recognition software and may include unintentional dictation errors.   Minna Antis, MD 04/16/23 [phone]

## 2023-04-16 NOTE — H&P (Signed)
History and Physical    Patient: Angela Jensen:[id number] DOB: October 15, 1955 DOA: 04/16/2023 DOS: the patient was seen and examined on 04/17/2023 PCP: Sallyanne Kuster, NP  Patient coming from: Home   Chief Complaint:  Chief Complaint  Patient presents with   Shortness of Breath    HPI: DELBERTA Jensen is a 67 y.o. female with medical history significant for shortness of breath for about prior to coming here.  Patient EMS and has history of lung cancer and is on home O2 at 3 L.  Patient is in no distress currently is at her baseline and has been since a month has been short of breath.  No reports of fever hemoptysis cough congestion diarrhea chest pain palpitations any alleviating or worsening factors.  Patient has been using her inhalers and nebulizer treatments without relief.  Patient also reports that her left leg has been cramping intermittently follows up with Dr. Gilda Crease Dr. Wyn Quaker vascular surgery and has stents in her advised her to follow-up in clinic be related to circulation pulses in the left leg is 1+ right leg is 2+.  No reports of falls. Initial EKG shows sinus rhythm QRS of 90 bpm no ST-T wave changes initial chest x-ray was abnormal opacities in the right lung field stat CT chest hydropneumothorax.  General surgery was consulted for the pneumothorax and recommended that patient be monitored as an oxygen requirement is not increased with repeat x-ray in the morning and we will determine any additional plans.  Review of Systems: Review of Systems  Respiratory:  Positive for shortness of breath.   All other systems reviewed and are negative.   Past Medical History:  Diagnosis Date   Anemia    Arthritis    Asthma    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/2016   Cancer (HCC) 2012   Right Lung CA   COPD (chronic obstructive pulmonary disease) (HCC)    Depression    Diabetes mellitus without complication (HCC)    Patient  takes Janumet   Essential hypertension 05/26/2016   Heart failure (HCC) 2022   Hypercholesteremia    Oxygen dependent    2L at nite    PAD (peripheral artery disease) (HCC) 06/22/2016   Peripheral vascular disease (HCC)    Personal history of radiation therapy    Shortness of breath dyspnea    with exertion    Sialolithiasis    Sleep apnea    Wears dentures    full upper and lower   Past Surgical History:  Procedure Laterality Date   CESAREAN SECTION     x3   COLONOSCOPY WITH PROPOFOL N/A 06/25/2015   Procedure: COLONOSCOPY WITH PROPOFOL;  Surgeon: Midge Minium, MD;  Location: [facility];  Service: Endoscopy;  Laterality: N/A;   COLONOSCOPY WITH PROPOFOL N/A 07/26/2020   Procedure: COLONOSCOPY WITH PROPOFOL;  Surgeon: Midge Minium, MD;  Location: [facility];  Service: Endoscopy;  Laterality: N/A;   CYST REMOVAL LEG     and on shoulder    ESOPHAGOGASTRODUODENOSCOPY (EGD) WITH PROPOFOL N/A 07/26/2020   Procedure: ESOPHAGOGASTRODUODENOSCOPY (EGD) WITH PROPOFOL;  Surgeon: Midge Minium, MD;  Location: [facility];  Service: Endoscopy;  Laterality: N/A;  Diabetic - oral meds   LOWER EXTREMITY ANGIOGRAPHY Left 09/29/2018   Procedure: LOWER EXTREMITY ANGIOGRAPHY;  Surgeon: Annice Needy, MD;  Location: [facility];  Service: Cardiovascular;  Laterality: Left;   LUNG BIOPSY  12/30/2011   has lung "spots"  PACEMAKER IMPLANT  07/14/2021   PACEMAKER LEADLESS INSERTION N/A 07/14/2021   Procedure: PACEMAKER LEADLESS INSERTION;  Surgeon: Marcina Millard, MD;  Location: [facility];  Service: Cardiovascular;  Laterality: N/A;   PERIPHERAL VASCULAR CATHETERIZATION Left 06/01/2016   Procedure: Lower Extremity Angiography;  Surgeon: Annice Needy, MD;  Location: [facility];  Service: Cardiovascular;  Laterality: Left;   PERIPHERAL VASCULAR CATHETERIZATION N/A 06/01/2016   Procedure: Abdominal Aortogram w/Lower Extremity;  Surgeon: Annice Needy, MD;   Location: [facility];  Service: Cardiovascular;  Laterality: N/A;   PERIPHERAL VASCULAR CATHETERIZATION  06/01/2016   Procedure: Lower Extremity Intervention;  Surgeon: Annice Needy, MD;  Location: [facility];  Service: Cardiovascular;;   PERIPHERAL VASCULAR CATHETERIZATION Right 06/08/2016   Procedure: Lower Extremity Angiography;  Surgeon: Annice Needy, MD;  Location: [facility];  Service: Cardiovascular;  Laterality: Right;   PERIPHERAL VASCULAR CATHETERIZATION  06/08/2016   Procedure: Lower Extremity Intervention;  Surgeon: Annice Needy, MD;  Location: [facility];  Service: Cardiovascular;;   SUBMANDIBULAR GLAND EXCISION Left 12/06/2020   Procedure: EXCISION SUBMANDIBULAR GLAND;  Surgeon: Linus Salmons, MD;  Location: [facility];  Service: ENT;  Laterality: Left;  needs to be first case Diabetic - diet controlled   TEMPORARY PACEMAKER N/A 07/11/2021   Procedure: TEMPORARY PACEMAKER;  Surgeon: Marcina Millard, MD;  Location: [facility];  Service: Cardiovascular;  Laterality: N/A;   Social History:   reports that she quit smoking about 13 years ago. Her smoking use included cigarettes. She started smoking about 50 years ago. She has a 37 pack-year smoking history. She has quit using smokeless tobacco.  Her smokeless tobacco use included snuff. She reports that she does not currently use alcohol after a past usage of about 5.0 standard drinks of alcohol per week. She reports that she does not currently use drugs after having used the following drugs: Marijuana, "Crack" cocaine, and Cocaine.  Allergies  Allergen Reactions   Trelegy Ellipta [Fluticasone-Umeclidin-Vilant]     Had breathing issues    Family History  Problem Relation Age of Onset   Diabetes Mother    Hypercholesterolemia Mother    Lung cancer Father    Diabetes Sister    Diabetes Sister    Hypertension Sister    Diabetes Maternal Grandmother    Diabetes  Paternal Grandmother    Heart attack Brother    Coronary artery disease Brother    Vascular Disease Brother    Heart attack Brother    Breast cancer Neg Hx     Prior to Admission medications   Medication Sig Start Date End Date Taking? Authorizing Provider  Accu-Chek Softclix Lancets lancets Use 1 lancet 3 times daily to check glucose for diabetes 01/19/23   Sallyanne Kuster, NP  albuterol (VENTOLIN HFA) 108 (90 Base) MCG/ACT inhaler INHALE 2 PUFFS BY MOUTH EVERY 6 HOURS AS NEEDED FOR WHEEZING OR SHORTNESS OF BREATH 11/30/22   Sallyanne Kuster, NP  ALPRAZolam (XANAX) 0.25 MG tablet TAKE 1 TABLET BY MOUTH THREE TIMES DAILY AS NEEDED FOR ANXIETY 04/12/23   Sallyanne Kuster, NP  Blood Glucose Monitoring Suppl (ACCU-CHEK GUIDE) w/Device KIT Use as directed Dx e11.65 04/11/21   Lyndon Code, MD  calcium-vitamin D (OSCAL WITH D) 500-5 MG-MCG tablet Take 1 tablet by mouth 2 (two) times daily. 11/20/22   Marrion Coy, MD  clopidogrel (PLAVIX) 75 MG tablet Take 1 tablet (75 mg total) by mouth daily.  11/30/22   Sallyanne Kuster, NP  Continuous Blood Gluc Receiver (DEXCOM G7 RECEIVER) DEVI Use one as directed for uncontrolled dm 06/09/22   Sallyanne Kuster, NP  Continuous Blood Gluc Sensor (DEXCOM G7 SENSOR) MISC Apply 1 sensor to skin on designated areas every 10 days to monitor glucose levels with sensor or smart phone app 07/02/22   Sallyanne Kuster, NP  cyclobenzaprine (FLEXERIL) 10 MG tablet Take 1 tablet (10 mg total) by mouth at bedtime. Take one tab po qhs for back spasm prn only 11/30/22   Sallyanne Kuster, NP  diclofenac (VOLTAREN) 75 MG EC tablet Take 1 tablet (75 mg total) by mouth 2 (two) times daily. 04/12/23   Sallyanne Kuster, NP  escitalopram (LEXAPRO) 10 MG tablet Take 1 tablet (10 mg total) by mouth daily. 12/08/22   Sallyanne Kuster, NP  famotidine (PEPCID) 20 MG tablet Take 1 tablet (20 mg total) by mouth daily for 14 days. 06/14/21 03/31/23  Lynn Ito, MD  ferrous sulfate 325 (65  FE) MG tablet Take 325 mg by mouth 2 (two) times daily with a meal.    [provider]  fluticasone (FLONASE) 50 MCG/ACT nasal spray Place 2 sprays into both nostrils daily. 05/09/21   Pennie Banter, DO  furosemide (LASIX) 20 MG tablet Take 1 tablet (20 mg total) by mouth daily as needed. 06/09/22   Sallyanne Kuster, NP  gabapentin (NEURONTIN) 100 MG capsule Take by mouth. 01/13/23   [provider]  gabapentin (NEURONTIN) 300 MG capsule Take 1 capsule (300 mg total) by mouth 3 (three) times daily. 03/02/23   Abernathy, Arlyss Repress, NP  glucose blood (ACCU-CHEK GUIDE) test strip USE TO TEST BLOOD SUGARS 5 TIMES DAILY 04/01/23   Abernathy, Arlyss Repress, NP  insulin detemir (LEVEMIR FLEXTOUCH) 100 UNIT/ML FlexPen Inject 15 Units into the skin daily with supper. 10/08/22   Sallyanne Kuster, NP  ipratropium-albuterol (DUONEB) 0.5-2.5 (3) MG/3ML SOLN Take 3 mLs by nebulization every 4 (four) hours as needed. 11/30/22   Sallyanne Kuster, NP  lisinopril-hydrochlorothiazide (ZESTORETIC) 20-25 MG tablet Take 1 tablet by mouth daily. 02/03/22   [provider]  metFORMIN (GLUCOPHAGE) 500 MG tablet TAKE 2 TABLETS BY MOUTH ONCE DAILY WITH BREAKFAST 12/08/22   Sallyanne Kuster, NP  metoprolol succinate (TOPROL-XL) 25 MG 24 hr tablet Take 1 tablet (25 mg total) by mouth daily. 04/12/23   Sallyanne Kuster, NP  mometasone-formoterol (DULERA) 200-5 MCG/ACT AERO Inhale 2 puffs into the lungs 2 (two) times daily. 06/19/22   Sallyanne Kuster, NP  Omega-3 Fatty Acids (FISH OIL) 1000 MG CAPS Take 1 capsule by mouth daily.    [provider]  OXYGEN Inhale 4 L into the lungs. PT USES [facility] FOR OXYGEN    [provider]  potassium chloride (KLOR-CON) 10 MEQ tablet Take 1 tablet (10 mEq total) by mouth every other day. 11/30/22   Sallyanne Kuster, NP  predniSONE (DELTASONE) 10 MG tablet Take one tab 3 x day for 3 days, then take one tab 2 x a day for 3 days and then take one tab a day  for 3 days for copd 12/31/22   McDonough, Lauren K, PA-C  RELION PEN NEEDLES 31G X 6 MM MISC USE 1 PEN NEEDLE WITH INSULIN PEN ONCE DAILY FOR DIABETES 03/23/23   Sallyanne Kuster, NP  SPIRIVA HANDIHALER 18 MCG inhalation capsule INHALE THE CONTENTS OF 1 CAPSULES BY MOUTH ONCE DAILY - DO NOT SWALLOW CAPSULES 12/31/22   McDonough, Lauren K, PA-C  traMADol (ULTRAM) 50 MG  tablet Take 1 tablet (50 mg total) by mouth every 12 (twelve) hours as needed. 11/30/22   Sallyanne Kuster, NP     Vitals:   04/16/23 2300 04/16/23 2317 04/17/23 0000 04/17/23 0045  BP: 134/79  (!) 145/110   Pulse: 86  90 87  Resp: 18  16 16   Temp:   98.2 F (36.8 C)   TempSrc:   Oral   SpO2: 99% 100% 99% 100%   Physical Exam Vitals and nursing note reviewed.  Constitutional:      General: She is not in acute distress.    Interventions: Nasal cannula in place.  HENT:     Head: Normocephalic and atraumatic.     Right Ear: Hearing normal.     Left Ear: Hearing normal.     Nose: Nose normal. No nasal deformity.     Mouth/Throat:     Lips: Pink.     Tongue: No lesions.     Pharynx: Oropharynx is clear.  Eyes:     General: Lids are normal.     Extraocular Movements: Extraocular movements intact.  Cardiovascular:     Rate and Rhythm: Regular rhythm. Tachycardia present.     Pulses:          Dorsalis pedis pulses are 2+ on the right side and 1+ on the left side.       Posterior tibial pulses are 2+ on the right side and 1+ on the left side.     Heart sounds: Normal heart sounds.  Pulmonary:     Effort: Pulmonary effort is normal.     Breath sounds: Wheezing present.  Abdominal:     General: Bowel sounds are normal. There is no distension.     Palpations: Abdomen is soft. There is no mass.     Tenderness: There is no abdominal tenderness.  Musculoskeletal:     Right lower leg: No edema.     Left lower leg: No edema.  Skin:    General: Skin is warm.  Neurological:     General: No focal deficit present.      Mental Status: She is alert and oriented to person, place, and time.     Cranial Nerves: Cranial nerves 2-12 are intact.  Psychiatric:        Attention and Perception: Attention normal.        Mood and Affect: Mood normal.        Speech: Speech normal.        Behavior: Behavior normal. Behavior is cooperative.     Labs on Admission: I have personally reviewed following labs and imaging studies  CBC: Recent Labs  Lab 04/16/23 1518  WBC 7.9  NEUTROABS 6.7  HGB 8.8*  HCT 31.0*  MCV 101.6*  PLT 269   Basic Metabolic Panel: Recent Labs  Lab 04/16/23 1518  NA 141  K 4.2  CL 104  CO2 28  GLUCOSE 111*  BUN 26*  CREATININE 0.78  CALCIUM 8.9   GFR: Estimated Creatinine Clearance: 54 mL/min (by C-G formula based on SCr of 0.78 mg/dL). Liver Function Tests: Recent Labs  Lab 04/16/23 1518  AST 23  ALT 20  ALKPHOS 59  BILITOT 0.4  PROT 7.2  ALBUMIN 3.9   No results for input(s): "LIPASE", "AMYLASE" in the last 168 hours. No results for input(s): "AMMONIA" in the last 168 hours. Coagulation Profile: No results for input(s): "INR", "PROTIME" in the last 168 hours. Cardiac Enzymes: No results for input(s): "CKTOTAL", "CKMB", "CKMBINDEX", "TROPONINI"  in the last 168 hours. BNP (last 3 results) No results for input(s): "PROBNP" in the last 8760 hours. HbA1C: No results for input(s): "HGBA1C" in the last 72 hours. CBG: Recent Labs  Lab 04/17/23 0020  GLUCAP 182*   Lipid Profile: No results for input(s): "CHOL", "HDL", "LDLCALC", "TRIG", "CHOLHDL", "LDLDIRECT" in the last 72 hours. Thyroid Function Tests: No results for input(s): "TSH", "T4TOTAL", "FREET4", "T3FREE", "THYROIDAB" in the last 72 hours. Anemia Panel: No results for input(s): "VITAMINB12", "FOLATE", "FERRITIN", "TIBC", "IRON", "RETICCTPCT" in the last 72 hours. Urinalysis    Component Value Date/Time   COLORURINE STRAW (A) 07/08/2021 2133   APPEARANCEUR Clear 08/06/2022 1541   LABSPEC 1.008  07/08/2021 2133   PHURINE 6.0 07/08/2021 2133   GLUCOSEU Negative 08/06/2022 1541   HGBUR NEGATIVE 07/08/2021 2133   BILIRUBINUR Negative 08/06/2022 1541   KETONESUR NEGATIVE 07/08/2021 2133   PROTEINUR Negative 08/06/2022 1541   PROTEINUR NEGATIVE 07/08/2021 2133   UROBILINOGEN 0.2 04/22/2021 0959   NITRITE Negative 08/06/2022 1541   NITRITE NEGATIVE 07/08/2021 2133   LEUKOCYTESUR Negative 08/06/2022 1541   LEUKOCYTESUR NEGATIVE 07/08/2021 2133   Unresulted Labs (From admission, onward)     Start     Ordered   04/17/23 0500  Comprehensive metabolic panel  Tomorrow morning,   R        04/16/23 2048   04/17/23 0500  CBC  Tomorrow morning,   R        04/16/23 2048            Medications  ALPRAZolam (XANAX) tablet 0.25 mg (has no administration in time range)  clopidogrel (PLAVIX) tablet 75 mg (has no administration in time range)  fluticasone (FLONASE) 50 MCG/ACT nasal spray 2 spray (has no administration in time range)  tiotropium (SPIRIVA) inhalation capsule ([facility] use ONLY) 18 mcg (has no administration in time range)  sodium chloride flush (NS) 0.9 % injection 3 mL (3 mLs Intravenous Given 04/16/23 2116)  acetaminophen (TYLENOL) tablet 650 mg (650 mg Oral Given 04/16/23 2139)    Or  acetaminophen (TYLENOL) suppository 650 mg ( Rectal See Alternative 04/16/23 2139)  HYDROcodone-acetaminophen (NORCO/VICODIN) 5-325 MG per tablet 1 tablet (has no administration in time range)  morphine (PF) 2 MG/ML injection 2 mg (has no administration in time range)  insulin aspart (novoLOG) injection 0-15 Units (has no administration in time range)  insulin aspart (novoLOG) injection 0-15 Units (has no administration in time range)  escitalopram (LEXAPRO) tablet 10 mg (has no administration in time range)  ipratropium-albuterol (DUONEB) 0.5-2.5 (3) MG/3ML nebulizer solution 3 mL (has no administration in time range)  metoprolol succinate (TOPROL-XL) 24 hr tablet 25 mg (has no administration in  time range)  mometasone-formoterol (DULERA) 200-5 MCG/ACT inhaler 2 puff (has no administration in time range)    Radiological Exams on Admission: CT CHEST WO CONTRAST  Result Date: 04/16/2023 CLINICAL DATA:  Pneumothorax. Dyspnea on exertion that started yesterday. Dry cough. EXAM: CT CHEST WITHOUT CONTRAST TECHNIQUE: Multidetector CT imaging of the chest was performed following the standard protocol without IV contrast. RADIATION DOSE REDUCTION: This exam was performed according to the departmental dose-optimization program which includes automated exposure control, adjustment of the mA and/or kV according to patient size and/or use of iterative reconstruction technique. COMPARISON:  Radiograph 04/16/2023 at 3:35 p.m. and CT chest 03/24/2023 FINDINGS: Cardiovascular: Normal heart size. No pericardial effusion. Leadless pacer. Coronary artery and aortic atherosclerotic calcification. Mediastinum/Nodes: Trachea and esophagus are unremarkable. No thoracic adenopathy. No mediastinal shift Lungs/Pleura: Moderate  right apical, anterior, and basilar pneumothorax as seen on same day chest radiograph. Small right pleural effusion. Slightly increased consolidation about the postradiation fibrosis and architectural distortion in the right middle lobe and right lower lobe favored due to volume loss from the pneumothorax. Advanced centrilobular emphysema greatest in the upper lungs. Right apical scarring. Unchanged 4 mm right upper lobe nodule on series 2/image 33. Upper Abdomen: No acute abnormality. Musculoskeletal: Unchanged right rib fractures. IMPRESSION: 1. Moderate right hydropneumothorax.  No evidence of tension. 2. Slightly increased consolidation about the postradiation fibrosis and architectural distortion in the right middle lobe and right lower lobe favored due to volume loss from the pneumothorax. 3. Unchanged 4 mm right upper lobe nodule. Continued attention on follow-up. Aortic Atherosclerosis  (ICD10-I70.0) and Emphysema (ICD10-J43.9). Electronically Signed   By: Minerva Fester M.D.   On: 04/16/2023 18:57   DG Chest 2 View  Result Date: 04/16/2023 CLINICAL DATA:  Dyspnea on exertion since yesterday. EXAM: CHEST - 2 VIEW COMPARISON:  November 18, 2022.  March 24, 2023. FINDINGS: Stable cardiomediastinal silhouette. Stable post treatment change seen in right lung base. Left lung is clear. Small right apical and basilar pneumothorax is noted. Bony thorax is unremarkable. IMPRESSION: Small right apical and basilar pneumothorax is noted. Critical Value/emergent results were called by telephone at the time of interpretation on 04/16/2023 at 4:07 pm to provider Dr. Lenard Lance, who verbally acknowledged these results. Electronically Signed   By: Lupita Raider M.D.   On: 04/16/2023 16:09     Data Reviewed: Relevant notes from primary care and specialist visits, past discharge summaries as available in EHR, including Care Everywhere. Prior diagnostic testing as pertinent to current admission diagnoses Updated medications and problem lists for reconciliation ED course, including vitals, labs, imaging, treatment and response to treatment Triage notes, nursing and pharmacy notes and ED provider's notes Notable results as noted in HPI  Assessment and Plan: * SOB (shortness of breath) 2/2 to her hydropneumothorax. General surgery Dr. Maia Plan   Hydropneumothorax Plan is for repeat chest xray in am and intervene if indicated per general surgery ./ consult message and order placed.   Chronic combined systolic and diastolic CHF (congestive heart failure) (HCC) Stable.  Compensated.  Patient is at baseline. Strict I/O. Daily weights.  Will continue patient on her metoprolol / LISinopril /hydrochlorothiazide and lasix.   Malignant neoplasm of upper lobe of right lung (HCC) Pt being followed by oncology Dr. 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 -Recurrence-SBRT  completed 06/11/2020.NGS showed TMB >10 muts/mb, MS stable, KRAS G12V, MYC amplification,STK11 S219fs*47, FUBP1 splice sit 473+1G" With plans for continued surveillance.    Essential hypertension Vitals:   04/16/23 1514 04/16/23 1800 04/16/23 1936 04/16/23 2139  BP: 121/80 137/79 138/79 138/78   04/16/23 2200 04/16/23 2300 04/17/23 0000  BP: 122/66 134/79 (!) 145/110  Continue home regimen of lisinopril/ hydrochlorothiazide and metoprolol.     Diabetes (HCC) Glycemic protocol.  Hold metformin and levemir.   PAD (peripheral artery disease) (HCC) Pt has stents for her PAD and f/u with dr. Lorretta Harp. D/W her to f/u sooner and let him know  so may need earlier appt .      DVT prophylaxis:  SCDs  Consults:  General surgery: Dr.Cintron.  Advance Care Planning:    Code Status: Full Code   Family Communication:  Daughter at bedside.   Disposition Plan:  Home   Severity of Illness: The appropriate patient status for this patient is OBSERVATION. Observation  status is judged to be reasonable and necessary in order to provide the required intensity of service to ensure the patient's safety. The patient's presenting symptoms, physical exam findings, and initial radiographic and laboratory data in the context of their medical condition is felt to place them at decreased risk for further clinical deterioration. Furthermore, it is anticipated that the patient will be medically stable for discharge from the hospital within 2 midnights of admission.   Author: Gertha Calkin, MD 04/17/2023 1:15 AM  For on call review [email or web address].

## 2023-04-16 NOTE — ED Triage Notes (Signed)
Pt c/o DOE that started yesterday, states she's been using nebulizer treatments and inhalers  at home w/ out relief. Pt reports dry cough. Pt on 4 liter's Elbert chronically. Wheeze noted bilaterally. Pt has had 3 nebulizers today.

## 2023-04-16 NOTE — ED Triage Notes (Signed)
Arrives from home via [facility].  C/O difficulty breathing x 1 hour. Wears 4l/ [facility] home oxygen.  VS wnl

## 2023-04-17 ENCOUNTER — Observation Stay: Payer: 59

## 2023-04-17 DIAGNOSIS — I10 Essential (primary) hypertension: Secondary | ICD-10-CM

## 2023-04-17 DIAGNOSIS — Z7984 Long term (current) use of oral hypoglycemic drugs: Secondary | ICD-10-CM | POA: Diagnosis not present

## 2023-04-17 DIAGNOSIS — R0602 Shortness of breath: Secondary | ICD-10-CM | POA: Diagnosis not present

## 2023-04-17 DIAGNOSIS — Z950 Presence of cardiac pacemaker: Secondary | ICD-10-CM | POA: Diagnosis not present

## 2023-04-17 DIAGNOSIS — J939 Pneumothorax, unspecified: Secondary | ICD-10-CM | POA: Diagnosis not present

## 2023-04-17 DIAGNOSIS — Z515 Encounter for palliative care: Secondary | ICD-10-CM | POA: Diagnosis not present

## 2023-04-17 DIAGNOSIS — Z8249 Family history of ischemic heart disease and other diseases of the circulatory system: Secondary | ICD-10-CM | POA: Diagnosis not present

## 2023-04-17 DIAGNOSIS — C3411 Malignant neoplasm of upper lobe, right bronchus or lung: Secondary | ICD-10-CM | POA: Diagnosis not present

## 2023-04-17 DIAGNOSIS — Z923 Personal history of irradiation: Secondary | ICD-10-CM | POA: Diagnosis not present

## 2023-04-17 DIAGNOSIS — D638 Anemia in other chronic diseases classified elsewhere: Secondary | ICD-10-CM | POA: Diagnosis present

## 2023-04-17 DIAGNOSIS — Z833 Family history of diabetes mellitus: Secondary | ICD-10-CM | POA: Diagnosis not present

## 2023-04-17 DIAGNOSIS — I5042 Chronic combined systolic (congestive) and diastolic (congestive) heart failure: Secondary | ICD-10-CM

## 2023-04-17 DIAGNOSIS — F32A Depression, unspecified: Secondary | ICD-10-CM | POA: Diagnosis present

## 2023-04-17 DIAGNOSIS — J9621 Acute and chronic respiratory failure with hypoxia: Secondary | ICD-10-CM | POA: Diagnosis present

## 2023-04-17 DIAGNOSIS — R911 Solitary pulmonary nodule: Secondary | ICD-10-CM | POA: Diagnosis present

## 2023-04-17 DIAGNOSIS — E1165 Type 2 diabetes mellitus with hyperglycemia: Secondary | ICD-10-CM | POA: Diagnosis present

## 2023-04-17 DIAGNOSIS — I2510 Atherosclerotic heart disease of native coronary artery without angina pectoris: Secondary | ICD-10-CM | POA: Diagnosis present

## 2023-04-17 DIAGNOSIS — G4733 Obstructive sleep apnea (adult) (pediatric): Secondary | ICD-10-CM | POA: Diagnosis present

## 2023-04-17 DIAGNOSIS — J948 Other specified pleural conditions: Secondary | ICD-10-CM | POA: Diagnosis not present

## 2023-04-17 DIAGNOSIS — E875 Hyperkalemia: Secondary | ICD-10-CM | POA: Diagnosis present

## 2023-04-17 DIAGNOSIS — Z9981 Dependence on supplemental oxygen: Secondary | ICD-10-CM | POA: Diagnosis not present

## 2023-04-17 DIAGNOSIS — Z95828 Presence of other vascular implants and grafts: Secondary | ICD-10-CM | POA: Diagnosis not present

## 2023-04-17 DIAGNOSIS — J9382 Other air leak: Secondary | ICD-10-CM | POA: Diagnosis present

## 2023-04-17 DIAGNOSIS — Z85118 Personal history of other malignant neoplasm of bronchus and lung: Secondary | ICD-10-CM | POA: Diagnosis not present

## 2023-04-17 DIAGNOSIS — E1151 Type 2 diabetes mellitus with diabetic peripheral angiopathy without gangrene: Secondary | ICD-10-CM | POA: Diagnosis present

## 2023-04-17 DIAGNOSIS — Z794 Long term (current) use of insulin: Secondary | ICD-10-CM | POA: Diagnosis not present

## 2023-04-17 DIAGNOSIS — I110 Hypertensive heart disease with heart failure: Secondary | ICD-10-CM | POA: Diagnosis present

## 2023-04-17 DIAGNOSIS — E7800 Pure hypercholesterolemia, unspecified: Secondary | ICD-10-CM | POA: Diagnosis present

## 2023-04-17 DIAGNOSIS — J4489 Other specified chronic obstructive pulmonary disease: Secondary | ICD-10-CM | POA: Diagnosis present

## 2023-04-17 LAB — URINALYSIS, COMPLETE (UACMP) WITH MICROSCOPIC
Bilirubin Urine: NEGATIVE
Glucose, UA: NEGATIVE mg/dL
Ketones, ur: NEGATIVE mg/dL
Leukocytes,Ua: NEGATIVE
Nitrite: NEGATIVE
Protein, ur: NEGATIVE mg/dL
Specific Gravity, Urine: 1.014 (ref 1.005–1.030)
pH: 6 (ref 5.0–8.0)

## 2023-04-17 LAB — COMPREHENSIVE METABOLIC PANEL
ALT: 17 U/L (ref 0–44)
AST: 28 U/L (ref 15–41)
Albumin: 3.4 g/dL — ABNORMAL LOW (ref 3.5–5.0)
Alkaline Phosphatase: 45 U/L (ref 38–126)
Anion gap: 5 (ref 5–15)
BUN: 34 mg/dL — ABNORMAL HIGH (ref 8–23)
CO2: 32 mmol/L (ref 22–32)
Calcium: 8.3 mg/dL — ABNORMAL LOW (ref 8.9–10.3)
Chloride: 102 mmol/L (ref 98–111)
Creatinine, Ser: 0.67 mg/dL (ref 0.44–1.00)
GFR, Estimated: >60 mL/min (ref >60)
Glucose, Bld: 200 mg/dL — ABNORMAL HIGH (ref 70–99)
Potassium: 4.5 mmol/L (ref 3.5–5.1)
Sodium: 139 mmol/L (ref 135–145)
Total Bilirubin: 0.3 mg/dL (ref 0.3–1.2)
Total Protein: 6.3 g/dL — ABNORMAL LOW (ref 6.5–8.1)

## 2023-04-17 LAB — CBC
HCT: 26.2 % — ABNORMAL LOW (ref 36.0–46.0)
Hemoglobin: 7.5 g/dL — ABNORMAL LOW (ref 12.0–15.0)
MCH: 29.1 pg (ref 26.0–34.0)
MCHC: 28.6 g/dL — ABNORMAL LOW (ref 30.0–36.0)
MCV: 101.6 fL — ABNORMAL HIGH (ref 80.0–100.0)
Platelets: 250 10*3/uL (ref 150–400)
RBC: 2.58 MIL/uL — ABNORMAL LOW (ref 3.87–5.11)
RDW: 14.8 % (ref 11.5–15.5)
WBC: 5.7 10*3/uL (ref 4.0–10.5)
nRBC: 0 % (ref 0.0–0.2)

## 2023-04-17 LAB — CBG MONITORING, ED
Glucose-Capillary: 182 mg/dL — ABNORMAL HIGH (ref 70–99)
Glucose-Capillary: 209 mg/dL — ABNORMAL HIGH (ref 70–99)

## 2023-04-17 LAB — GLUCOSE, CAPILLARY
Glucose-Capillary: 122 mg/dL — ABNORMAL HIGH (ref 70–99)
Glucose-Capillary: 211 mg/dL — ABNORMAL HIGH (ref 70–99)
Glucose-Capillary: 85 mg/dL (ref 70–99)

## 2023-04-17 MED ORDER — IPRATROPIUM-ALBUTEROL 0.5-2.5 (3) MG/3ML IN SOLN: 3.0000 mL | Freq: Four times a day (QID) | RESPIRATORY_TRACT | Status: DC | PRN

## 2023-04-17 MED ORDER — MOMETASONE FURO-FORMOTEROL FUM 200-5 MCG/ACT IN AERO
2.0000 | INHALATION_SPRAY | Freq: Two times a day (BID) | RESPIRATORY_TRACT | Status: DC
  Administered 2023-04-18 – 2023-04-22 (×10): 2 via RESPIRATORY_TRACT
  Filled 2023-04-17 (×2): qty 8.8

## 2023-04-17 MED ORDER — INSULIN ASPART 100 UNIT/ML IJ SOLN
0.0000 [IU] | Freq: Three times a day (TID) | INTRAMUSCULAR | Status: DC
  Administered 2023-04-17 (×2): 5 [IU] via SUBCUTANEOUS
  Administered 2023-04-18: 3 [IU] via SUBCUTANEOUS
  Administered 2023-04-18: 2 [IU] via SUBCUTANEOUS
  Administered 2023-04-18: 3 [IU] via SUBCUTANEOUS
  Administered 2023-04-19: 2 [IU] via SUBCUTANEOUS
  Administered 2023-04-19: 3 [IU] via SUBCUTANEOUS
  Administered 2023-04-19: 5 [IU] via SUBCUTANEOUS
  Administered 2023-04-20: 2 [IU] via SUBCUTANEOUS
  Administered 2023-04-20 – 2023-04-21 (×3): 3 [IU] via SUBCUTANEOUS
  Administered 2023-04-21: 2 [IU] via SUBCUTANEOUS
  Filled 2023-04-17 (×13): qty 1

## 2023-04-17 MED ORDER — METOPROLOL SUCCINATE ER 25 MG PO TB24
25.0000 mg | ORAL_TABLET | Freq: Every day | ORAL | Status: DC
  Administered 2023-04-17 – 2023-04-22 (×6): 25 mg via ORAL
  Filled 2023-04-17 (×6): qty 1

## 2023-04-17 MED ORDER — LIDOCAINE 1 % OPTIME INJ - NO CHARGE
10.0000 mL | Freq: Once | INTRAMUSCULAR | Status: AC
  Administered 2023-04-17: 10 mL via INTRADERMAL

## 2023-04-17 MED ORDER — ESCITALOPRAM OXALATE 10 MG PO TABS
10.0000 mg | ORAL_TABLET | Freq: Every day | ORAL | Status: DC
  Administered 2023-04-18 – 2023-04-22 (×5): 10 mg via ORAL
  Filled 2023-04-17 (×5): qty 1

## 2023-04-17 MED ORDER — ALBUTEROL SULFATE HFA 108 (90 BASE) MCG/ACT IN AERS: 1.0000 | INHALATION_SPRAY | Freq: Four times a day (QID) | RESPIRATORY_TRACT | Status: DC | PRN

## 2023-04-17 MED ORDER — ENOXAPARIN SODIUM 40 MG/0.4ML IJ SOSY
40.0000 mg | PREFILLED_SYRINGE | INTRAMUSCULAR | Status: DC
  Administered 2023-04-17 – 2023-04-21 (×5): 40 mg via SUBCUTANEOUS
  Filled 2023-04-17 (×5): qty 0.4

## 2023-04-17 NOTE — Assessment & Plan Note (Signed)
Vitals:   04/16/23 1514 04/16/23 1800 04/16/23 1936 04/16/23 2139  BP: 121/80 137/79 138/79 138/78   04/16/23 2200 04/16/23 2300 04/17/23 0000  BP: 122/66 134/79 (!) 145/110  Continue home regimen of lisinopril/ hydrochlorothiazide and metoprolol.

## 2023-04-17 NOTE — Assessment & Plan Note (Addendum)
Stable.  Compensated.  Patient is at baseline. Strict I/O. Daily weights.  Will continue patient on her metoprolol / LISinopril /hydrochlorothiazide and lasix.

## 2023-04-17 NOTE — Assessment & Plan Note (Signed)
Glycemic protocol.  Hold metformin and levemir.

## 2023-04-17 NOTE — ED Notes (Signed)
This RN remained with patient during procedure. Patient tolerated procedure will and reports some relief upon returned to ED room. Verified that chest tube remains in place and was secured to patient's side. Patient educated about importance of tube not being pulled and she stated that she understood.

## 2023-04-17 NOTE — Progress Notes (Signed)
1      PROGRESS NOTE    Angela Jensen  EXB:[id number] DOB: [birth year] DOA: 04/16/2023 PCP: Sallyanne Kuster, NP   Brief Narrative:   67 year old female with a known history of COPD and right lung cancer, s/p radiation therapy. She also has a history of a right pneumothorax in 2021 which required chest tube and hospital admission.  She is admitted for worsening shortness of breath and hypoxia.  In the ED she was found to have right-sided hydropneumothorax  8/31: CT-guided right chest tube placement by IR   Assessment & Plan:   Principal Problem:   SOB (shortness of breath) Active Problems:   Hydropneumothorax   Chronic combined systolic and diastolic CHF (congestive heart failure) (HCC)   Malignant neoplasm of upper lobe of right lung (HCC)   Essential hypertension   Diabetes (HCC)   PAD (peripheral artery disease) (HCC)  Right hydro pneumothorax Status post CT-guided right chest tube placement Further management per surgical team  Acute on chronic hypoxic respiratory failure At baseline patient uses 4 L oxygen via nasal cannula.  Currently requiring 5 to 6 L oxygen and had to be placed on high flow nasal cannula 8 L with minimal exertion going from bed to bedside commode in the emergency department  Chronic combined systolic and diastolic CHF (congestive heart failure) (HCC) Stable.  Compensated.  Patient is at baseline. Strict I/O. Daily weights.    Malignant neoplasm of upper lobe of right lung (HCC) Pt being followed by oncology Dr. 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 -Recurrence-SBRT completed 06/11/2020.NGS showed TMB >10 muts/mb, MS stable, KRAS G12V, MYC amplification,STK11 S22fs*47, FUBP1 splice sit 473+1G" Outpatient follow-up with oncology   Essential hypertension Blood pressure well-controlled   Diabetes (HCC) SSI for now.   PAD (peripheral artery disease) (HCC) Pt has stents for her PAD and f/u with dr. Lorretta Harp.   Continue Plavix    DVT prophylaxis: Lovenox   Code Status: Full code Family Communication: . NO "discussed with patient" Disposition Plan: (Possible discharge in 2 to 4 days depending on clinical condition chest tube management   Consultants:  General surgery Interventional radiology  Procedures:  Chest tube placement   Subjective: Feeling short of breath.  Objective: Vitals:   04/17/23 1129 04/17/23 1200 04/17/23 1348 04/17/23 1634  BP:  (!) 118/105 117/73 (!) 127/92  Pulse:  81 80 85  Resp:  18 18 18   Temp: 98.1 F (36.7 C)  98 F (36.7 C) 98.2 F (36.8 C)  TempSrc: Oral     SpO2:  95% 100% 100%   No intake or output data in the 24 hours ending 04/17/23 1941 There were no vitals filed for this visit.  Examination:  General exam: Appears calm and comfortable  Respiratory system: Clear to auscultation. Respiratory effort normal.  Decreased breath sounds at the bases on the right Cardiovascular system: S1 & S2 heard, RRR. No JVD, murmurs, rubs, gallops or clicks. No pedal edema. Gastrointestinal system: Abdomen is nondistended, soft and nontender. No organomegaly or masses felt. Normal bowel sounds heard. Central nervous system: Alert and oriented. No focal neurological deficits. Extremities: Symmetric 5 x 5 power. Skin: No rashes, lesions or ulcers Psychiatry: Judgement and insight appear normal. Mood & affect appropriate.     Data Reviewed: I have personally reviewed following labs and imaging studies  CBC: Recent Labs  Lab 04/16/23 1518 04/17/23 0558  WBC 7.9 5.7  NEUTROABS 6.7  --   HGB 8.8* 7.5*  HCT  31.0* 26.2*  MCV 101.6* 101.6*  PLT 269 250   Basic Metabolic Panel: Recent Labs  Lab 04/16/23 1518 04/17/23 0558  NA 141 139  K 4.2 4.5  CL 104 102  CO2 28 32  GLUCOSE 111* 200*  BUN 26* 34*  CREATININE 0.78 0.67  CALCIUM 8.9 8.3*   GFR: Estimated Creatinine Clearance: 54 mL/min (by C-G formula based on SCr of 0.67 mg/dL). Liver  Function Tests: Recent Labs  Lab 04/16/23 1518 04/17/23 0558  AST 23 28  ALT 20 17  ALKPHOS 59 45  BILITOT 0.4 0.3  PROT 7.2 6.3*  ALBUMIN 3.9 3.4*    CBG: Recent Labs  Lab 04/17/23 0020 04/17/23 0807 04/17/23 1349 04/17/23 1637  GLUCAP 182* 209* 122* 211*     Radiology Studies: CT PERC PLEURAL DRAIN W/INDWELL CATH W/IMG GUIDE  Result Date: 04/17/2023 INDICATION: Symptomatic right pneumothorax EXAM: CT-GUIDED CHEST TUBE PLACEMENT MEDICATIONS: No periprocedural antibiotics were indicated ANESTHESIA/SEDATION: Lidocaine 1% subcutaneous COMPLICATIONS: None immediate. PROCEDURE: Informed written consent was obtained from the patient after a thorough discussion of the procedural risks, benefits and alternatives. All questions were addressed. Maximal Sterile Barrier Technique was utilized including caps, mask, sterile gowns, sterile gloves, sterile drape, hand hygiene and skin antiseptic. A timeout was performed prior to the initiation of the procedure. Select scans through the thorax obtained. Appropriate skin entry site was determined and marked. Region was prepped with chlorhexidine, draped in usual sterile fashion, infiltrated locally with 1% lidocaine. 7 cm Yueh sheath needle was advanced into the pleural space. Gas could be aspirated. Amplatz guidewire advanced easily. Tract dilated to facilitate placement of a 14 French pigtail drain catheter. Catheter was placed to Pleur-Evac suction device. Confirmatory CT demonstrated good catheter position directed towards the apex, with virtually complete evacuation of the pneumothorax. Small volume subcutaneous emphysema. The catheter was secured externally with 0 Prolene suture and StatLock and a sterile dressing applied. The patient tolerated the procedure well. RADIATION DOSE REDUCTION: This exam was performed according to the departmental dose-optimization program which includes automated exposure control, adjustment of the mA and/or kV according  to patient size and/or use of iterative reconstruction technique. IMPRESSION: 1. Technically successful CT-guided right chest tube placement. Electronically Signed   By: Corlis Leak M.D.   On: 04/17/2023 14:29   DG Chest 2 View  Result Date: 04/17/2023 CLINICAL DATA:  67 year old female with history of lung cancer. Right hydropneumothorax, abnormal right middle and lower lobe on recent CT. EXAM: CHEST - 2 VIEW COMPARISON:  Chest CT 04/16/2023 and earlier. FINDINGS: Seated AP and lateral views of the chest at 0559 hours. Persistent right pneumothorax, not significantly changed from the CT and radiographs on 04/16/2023. Pleural fluid component better demonstrated by CT. Mildly improved right lung base ventilation compared to the most recent radiographs with residual streaky and patchy opacity better characterized by CT. Stable cardiac size and mediastinal contours. Visualized tracheal air column is within normal limits. Left lung appears stable, negative. No acute osseous abnormality identified. Negative visible bowel gas. IMPRESSION: 1. Persistent right hydropneumothorax, no significant change from yesterday. 2. No new cardiopulmonary abnormality. Electronically Signed   By: Odessa Fleming M.D.   On: 04/17/2023 06:21   CT CHEST WO CONTRAST  Result Date: 04/16/2023 CLINICAL DATA:  Pneumothorax. Dyspnea on exertion that started yesterday. Dry cough. EXAM: CT CHEST WITHOUT CONTRAST TECHNIQUE: Multidetector CT imaging of the chest was performed following the standard protocol without IV contrast. RADIATION DOSE REDUCTION: This exam was performed according to the  departmental dose-optimization program which includes automated exposure control, adjustment of the mA and/or kV according to patient size and/or use of iterative reconstruction technique. COMPARISON:  Radiograph 04/16/2023 at 3:35 p.m. and CT chest 03/24/2023 FINDINGS: Cardiovascular: Normal heart size. No pericardial effusion. Leadless pacer. Coronary artery  and aortic atherosclerotic calcification. Mediastinum/Nodes: Trachea and esophagus are unremarkable. No thoracic adenopathy. No mediastinal shift Lungs/Pleura: Moderate right apical, anterior, and basilar pneumothorax as seen on same day chest radiograph. Small right pleural effusion. Slightly increased consolidation about the postradiation fibrosis and architectural distortion in the right middle lobe and right lower lobe favored due to volume loss from the pneumothorax. Advanced centrilobular emphysema greatest in the upper lungs. Right apical scarring. Unchanged 4 mm right upper lobe nodule on series 2/image 33. Upper Abdomen: No acute abnormality. Musculoskeletal: Unchanged right rib fractures. IMPRESSION: 1. Moderate right hydropneumothorax.  No evidence of tension. 2. Slightly increased consolidation about the postradiation fibrosis and architectural distortion in the right middle lobe and right lower lobe favored due to volume loss from the pneumothorax. 3. Unchanged 4 mm right upper lobe nodule. Continued attention on follow-up. Aortic Atherosclerosis (ICD10-I70.0) and Emphysema (ICD10-J43.9). Electronically Signed   By: Minerva Fester M.D.   On: 04/16/2023 18:57   DG Chest 2 View  Result Date: 04/16/2023 CLINICAL DATA:  Dyspnea on exertion since yesterday. EXAM: CHEST - 2 VIEW COMPARISON:  November 18, 2022.  March 24, 2023. FINDINGS: Stable cardiomediastinal silhouette. Stable post treatment change seen in right lung base. Left lung is clear. Small right apical and basilar pneumothorax is noted. Bony thorax is unremarkable. IMPRESSION: Small right apical and basilar pneumothorax is noted. Critical Value/emergent results were called by telephone at the time of interpretation on 04/16/2023 at 4:07 pm to provider Dr. Lenard Lance, who verbally acknowledged these results. Electronically Signed   By: Lupita Raider M.D.   On: 04/16/2023 16:09        Scheduled Meds:  clopidogrel  75 mg Oral Daily    escitalopram  10 mg Oral Daily   insulin aspart  0-15 Units Subcutaneous TID WC   metoprolol succinate  25 mg Oral Daily   mometasone-formoterol  2 puff Inhalation BID   sodium chloride flush  3 mL Intravenous Q12H   tiotropium  18 mcg Inhalation Daily   Continuous Infusions:   LOS: 0 days    Time spent: 5 minutes    Delfino Lovett, MD [facility] Pager [phone]  If 7PM-7AM, please contact night-coverage [email or web address]  04/17/2023, 7:41 PM

## 2023-04-17 NOTE — ED Notes (Signed)
Pt requested bedpan because hard to use purewick. Bedpan placed per request, pt lifted hips for placement and removal of bedpan. With this amount of movement, pt became extremely SOB and was using accessory muscles and appeared air hungry. Pt desaturated at this time to 80% and took several minutes to come back to 90s even once oxygen turned to 6L. Pt using abdominal muscles to exhale. Lungs sound very diminished bilaterally. Hospitalist informed.

## 2023-04-17 NOTE — Assessment & Plan Note (Signed)
2/2 to her hydropneumothorax. General surgery Dr. Maia Plan

## 2023-04-17 NOTE — Assessment & Plan Note (Signed)
Pt being followed by oncology Dr. 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 -Recurrence-SBRT completed 06/11/2020.NGS showed TMB >10 muts/mb, MS stable, KRAS G12V, MYC amplification,STK11 S236fs*47, FUBP1 splice sit 473+1G" With plans for continued surveillance.

## 2023-04-17 NOTE — Plan of Care (Signed)

## 2023-04-17 NOTE — Assessment & Plan Note (Signed)
Pt has stents for her PAD and f/u with dr. Lorretta Harp. D/W her to f/u sooner and let him know  so may need earlier appt .

## 2023-04-17 NOTE — Progress Notes (Signed)
Case discussed with Dr. Lenard Lance.  Patient has history of right lung cancer s/p radiation therapy as well as COPD with significant O2 requirements with 4L [place] at baseline.  Has prior history of right sided pneumothorax in 2021 which required chest tube placement.  Patient presents with worsening shortness of breath. CXR showed a right sided pneumothorax.  CT chest showed a moderate right hydropneumothorax.  After viewing the images, did not think going blindly placing a chest tube would be appropriate and thus recommend evaluation by IR for chest tube placement.  Dr. Lenard Lance discussed with IR and they have recommended admission to medical team and repeat CXR in AM to re-evaluate need for chest tube.  Will continue to follow with you.  Full consult note in AM.  Henrene Dodge, MD

## 2023-04-17 NOTE — Procedures (Signed)
  Procedure:  CT guided R chest tube placment 22f Preprocedure diagnosis: The encounter diagnosis was Pneumothorax, unspecified type. Postprocedure diagnosis: same EBL:    minimal Complications:   none immediate  See full dictation in [employer].  Thora Lance MD Main # [phone] Pager  [phone] Mobile [phone]

## 2023-04-17 NOTE — Consult Note (Signed)
Consultation Note Date: 04/17/2023   Patient Name: Angela Jensen  DOB: [birth year]  MRN: [id number]  Age / Sex: 67 y.o., female  PCP: Angela Kuster, NP Referring Physician: Delfino Lovett, MD  Reason for Consultation: Establishing goals of care   HPI/Brief Hospital Course: 67 y.o. female  with past medical history of lung cancer in 2012 s/p radiation therapy, OSA, COPD with chronic respiratory failure on 3L [place] at baseline, CAD, HTN, HLD and CHF admitted from home on 04/16/2023 with shortness of breath.  CT chest 8/30 IMPRESSION: 1. Moderate right hydropneumothorax.  No evidence of tension. 2. Slightly increased consolidation about the postradiation fibrosis and architectural distortion in the right middle lobe and right lower lobe favored due to volume loss from the pneumothorax. 3. Unchanged 4 mm right upper lobe nodule. Continued attention on follow-up.  Repeat CXR 8/31 IMPRESSION: 1. Persistent right hydropneumothorax, no significant change from yesterday. 2. No new cardiopulmonary abnormality.  ED staff reports significant hypoxia and dyspnea with minimal exertion in bed  Evaluated by general surgery, IR consulted--percutaneous chest tube placed 8/31  Palliative medicine was consulted for assisting with goals of care conversations  Subjective:  Extensive chart review has been completed prior to meeting patient including labs, vital signs, imaging, progress notes, orders, and available advanced directive documents from current and previous encounters.  Visited with Angela Jensen at her bedside. Awake, alert, able to engage in conversations, recovering from hypoxic/dyspneic event while staff performed hygiene.  Introduced myself as a Publishing rights manager as a member of the palliative care team. Explained palliative medicine is specialized medical care for people living with serious illness. It focuses on providing relief from the symptoms and stress  of a serious illness. The goal is to improve quality of life for both the patient and the family.   Angela Jensen shares a brief life review. She has a long term boyfriend-Angela Jensen who she lives with. She has two daughters that live local and have frequent contact with. 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  Angela Jensen able to clearly share her understanding of current medical situation and shares she has had a pneumothorax in the past requiring chest tube placement. She is aware of the procedure and aware IR is anticipating placement today.  Attempted to elicit goals of care. We discussed code status, Full Code versus Do Not Resuscitate. Angela Jensen is clear that she wishes to remain Full Code and is accepting to any and all interventions offered moving forward.  Called and spoke with Angela Jensen per Angela Jensen's request. Provided medical update and encouraged to reach out to PMT with any questions or concerns as they arise. Will plan to visit at bedside again 9/1.  I discussed importance of continued conversations with family/support persons and all members of their medical team regarding overall plan of care and treatment options ensuring decisions are in alignment with patients goals of care.  All questions/concerns addressed.  PMT will continue to follow and support patient as needed.   Objective: Primary Diagnoses: Present on Admission:  Malignant neoplasm of upper lobe of right lung (HCC)  Essential hypertension  PAD (peripheral artery disease) (HCC)  Chronic combined systolic and diastolic CHF (congestive heart failure) (HCC)  SOB (shortness of breath)  Hydropneumothorax   Physical Exam Constitutional:      General: She is not in acute distress.    Appearance: She is ill-appearing.  Pulmonary:  Effort: Pulmonary effort is normal. No respiratory distress.  Skin:    General: Skin is warm and dry.   Neurological:     Mental Status: She is alert and oriented to person, place, and time.     Vital Signs: BP (!) 127/92 (BP Location: Right Arm)   Pulse 85   Temp 98.2 F (36.8 C)   Resp 18   SpO2 100%  Pain Scale: 0-10   Pain Score: 5    Thank you for this consult and allowing Palliative Medicine to participate in the care of Angela Jensen. Palliative medicine will continue to follow and assist as needed.   Time Total: 55 minutes  Time spent includes: Detailed review of medical records (labs, imaging, vital signs), medically appropriate exam (mental status, respiratory, cardiac, skin), discussed with treatment team, counseling and educating patient, family and staff, documenting clinical information, medication management and coordination of care.   Signed by: Angela Deed, DNP, AGNP-C Palliative Medicine    Please contact Palliative Medicine Team phone at [phone] for questions and concerns.  For individual provider: See Loretha Stapler

## 2023-04-17 NOTE — Consult Note (Signed)
Date of Consultation:  04/17/2023  Requesting Physician:  Minna Antis, MD  Reason for Consultation:  Right sided pneumothorax  History of Present Illness: Angela Jensen is a 67 y.o. female presenting with worsening shortness of breath that had been ongoing for 2-3 days.  Patient has a history of COPD and right lung cancer, s/p radiation therapy.  She also has a history of a right pneumothorax in 2021 which required chest tube and hospital admission.  She reports that initially the shortness of breath was not to significant, but yesterday it got much worse.  She had a difficult time just ambulating within her house and would have to stop halfway between to catch her breath.  She is on 4L [place] at baseline at home.  In the ED, her initial CXR showed a right sided pneumothorax, and CT chest confirmed a moderate size hydropneumothorax.  Dr. Lenard Lance discussed with IR about chest tube placement and plan was for repeat CXR today and reassess.  This morning, her repeat CXR still shows persistent right sided pneumothorax.  She has had issues with O2 desaturation while in the ED with minimal activity.  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/2016   Cancer (HCC) 2012   Right Lung CA   COPD (chronic obstructive pulmonary disease) (HCC)    Depression    Diabetes mellitus without complication (HCC)    Patient takes Janumet   Essential hypertension 05/26/2016   Heart failure (HCC) 2022   Hypercholesteremia    Oxygen dependent    2L at nite    PAD (peripheral artery disease) (HCC) 06/22/2016   Peripheral vascular disease (HCC)    Personal history of radiation therapy    Shortness of breath dyspnea    with exertion    Sialolithiasis    Sleep apnea    Wears dentures    full upper and lower     Past Surgical History: Past Surgical History:   Procedure Laterality Date   CESAREAN SECTION     x3   COLONOSCOPY WITH PROPOFOL N/A 06/25/2015   Procedure: COLONOSCOPY WITH PROPOFOL;  Surgeon: Midge Minium, MD;  Location: [facility];  Service: Endoscopy;  Laterality: N/A;   COLONOSCOPY WITH PROPOFOL N/A 07/26/2020   Procedure: COLONOSCOPY WITH PROPOFOL;  Surgeon: Midge Minium, MD;  Location: [facility];  Service: Endoscopy;  Laterality: N/A;   CYST REMOVAL LEG     and on shoulder    ESOPHAGOGASTRODUODENOSCOPY (EGD) WITH PROPOFOL N/A 07/26/2020   Procedure: ESOPHAGOGASTRODUODENOSCOPY (EGD) WITH PROPOFOL;  Surgeon: Midge Minium, MD;  Location: [facility];  Service: Endoscopy;  Laterality: N/A;  Diabetic - oral meds   LOWER EXTREMITY ANGIOGRAPHY Left 09/29/2018   Procedure: LOWER EXTREMITY ANGIOGRAPHY;  Surgeon: Annice Needy, MD;  Location: [facility];  Service: Cardiovascular;  Laterality: Left;   LUNG BIOPSY  12/30/2011   has lung "spots"   PACEMAKER IMPLANT  07/14/2021   PACEMAKER LEADLESS INSERTION N/A 07/14/2021   Procedure: PACEMAKER LEADLESS INSERTION;  Surgeon: Marcina Millard, MD;  Location: [facility];  Service: Cardiovascular;  Laterality: N/A;   PERIPHERAL VASCULAR CATHETERIZATION Left 06/01/2016   Procedure: Lower Extremity Angiography;  Surgeon: Annice Needy, MD;  Location: [facility];  Service: Cardiovascular;  Laterality: Left;   PERIPHERAL VASCULAR CATHETERIZATION N/A 06/01/2016   Procedure: Abdominal Aortogram w/Lower  Extremity;  Surgeon: Annice Needy, MD;  Location: [facility];  Service: Cardiovascular;  Laterality: N/A;   PERIPHERAL VASCULAR CATHETERIZATION  06/01/2016   Procedure: Lower Extremity Intervention;  Surgeon: Annice Needy, MD;  Location: [facility];  Service: Cardiovascular;;   PERIPHERAL VASCULAR CATHETERIZATION Right 06/08/2016   Procedure: Lower Extremity Angiography;  Surgeon: Annice Needy, MD;  Location: [facility];   Service: Cardiovascular;  Laterality: Right;   PERIPHERAL VASCULAR CATHETERIZATION  06/08/2016   Procedure: Lower Extremity Intervention;  Surgeon: Annice Needy, MD;  Location: [facility];  Service: Cardiovascular;;   SUBMANDIBULAR GLAND EXCISION Left 12/06/2020   Procedure: EXCISION SUBMANDIBULAR GLAND;  Surgeon: Linus Salmons, MD;  Location: [facility];  Service: ENT;  Laterality: Left;  needs to be first case Diabetic - diet controlled   TEMPORARY PACEMAKER N/A 07/11/2021   Procedure: TEMPORARY PACEMAKER;  Surgeon: Marcina Millard, MD;  Location: [facility];  Service: Cardiovascular;  Laterality: N/A;    Home Medications: Prior to Admission medications   Medication Sig Start Date End Date Taking? Authorizing Provider  albuterol (VENTOLIN HFA) 108 (90 Base) MCG/ACT inhaler INHALE 2 PUFFS BY MOUTH EVERY 6 HOURS AS NEEDED FOR WHEEZING OR SHORTNESS OF BREATH 11/30/22  Yes Abernathy, Alyssa, NP  ALPRAZolam (XANAX) 0.25 MG tablet TAKE 1 TABLET BY MOUTH THREE TIMES DAILY AS NEEDED FOR ANXIETY 04/12/23  Yes Abernathy, Alyssa, NP  calcium-vitamin D (OSCAL WITH D) 500-5 MG-MCG tablet Take 1 tablet by mouth 2 (two) times daily. 11/20/22  Yes Marrion Coy, MD  clopidogrel (PLAVIX) 75 MG tablet Take 1 tablet (75 mg total) by mouth daily. 11/30/22  Yes Abernathy, Arlyss Repress, NP  cyclobenzaprine (FLEXERIL) 10 MG tablet Take 1 tablet (10 mg total) by mouth at bedtime. Take one tab po qhs for back spasm prn only 11/30/22  Yes Abernathy, Alyssa, NP  escitalopram (LEXAPRO) 10 MG tablet Take 1 tablet (10 mg total) by mouth daily. 12/08/22  Yes Abernathy, Arlyss Repress, NP  famotidine (PEPCID) 20 MG tablet Take 1 tablet (20 mg total) by mouth daily for 14 days. Patient taking differently: Take 20 mg by mouth daily as needed for heartburn. 06/14/21 04/16/23 Yes Lynn Ito, MD  ferrous sulfate 325 (65 FE) MG tablet Take 325 mg by mouth 2 (two) times daily with a meal.   Yes [provider]  fluticasone (FLONASE) 50 MCG/ACT nasal spray Place 2 sprays into both nostrils daily. Patient taking differently: Place 2 sprays into both nostrils daily as needed. 05/09/21  Yes Esaw Grandchild A, DO  furosemide (LASIX) 20 MG tablet Take 1 tablet (20 mg total) by mouth daily as needed. 06/09/22  Yes Abernathy, Arlyss Repress, NP  gabapentin (NEURONTIN) 300 MG capsule Take 1 capsule (300 mg total) by mouth 3 (three) times daily. Patient taking differently: Take 600 mg by mouth daily. 03/02/23  Yes Abernathy, Alyssa, NP  insulin detemir (LEVEMIR FLEXTOUCH) 100 UNIT/ML FlexPen Inject 15 Units into the skin daily with supper. 10/08/22  Yes Abernathy, Alyssa, NP  ipratropium-albuterol (DUONEB) 0.5-2.5 (3) MG/3ML SOLN Take 3 mLs by nebulization every 4 (four) hours as needed. 11/30/22  Yes Abernathy, Arlyss Repress, NP  lisinopril-hydrochlorothiazide (ZESTORETIC) 20-25 MG tablet Take 1 tablet by mouth daily. 02/03/22  Yes [provider]  metFORMIN (GLUCOPHAGE) 500 MG tablet TAKE 2 TABLETS BY MOUTH ONCE DAILY WITH BREAKFAST 12/08/22  Yes Abernathy, Alyssa, NP  metoprolol succinate (TOPROL-XL) 25 MG 24 hr tablet Take 1 tablet (25 mg  total) by mouth daily. 04/12/23  Yes Abernathy, Arlyss Repress, NP  mometasone-formoterol (DULERA) 200-5 MCG/ACT AERO Inhale 2 puffs into the lungs 2 (two) times daily. 06/19/22  Yes Abernathy, Alyssa, NP  potassium chloride (KLOR-CON) 10 MEQ tablet Take 1 tablet (10 mEq total) by mouth every other day. 11/30/22  Yes Abernathy, Alyssa, NP  SPIRIVA HANDIHALER 18 MCG inhalation capsule INHALE THE CONTENTS OF 1 CAPSULES BY MOUTH ONCE DAILY - DO NOT SWALLOW CAPSULES 12/31/22  Yes McDonough, Lauren K, PA-C  traMADol (ULTRAM) 50 MG tablet Take 1 tablet (50 mg total) by mouth every 12 (twelve) hours as needed. 11/30/22  Yes Abernathy, Alyssa, NP  VITAMIN D, CHOLECALCIFEROL, PO Take 1 tablet by mouth daily with supper.   Yes [provider]  Accu-Chek Softclix Lancets lancets Use 1 lancet 3 times  daily to check glucose for diabetes 01/19/23   Sallyanne Kuster, NP  Blood Glucose Monitoring Suppl (ACCU-CHEK GUIDE) w/Device KIT Use as directed Dx e11.65 04/11/21   Lyndon Code, MD  Continuous Blood Gluc Receiver (DEXCOM G7 RECEIVER) DEVI Use one as directed for uncontrolled dm 06/09/22   Sallyanne Kuster, NP  Continuous Blood Gluc Sensor (DEXCOM G7 SENSOR) MISC Apply 1 sensor to skin on designated areas every 10 days to monitor glucose levels with sensor or smart phone app 07/02/22   Sallyanne Kuster, NP  diclofenac (VOLTAREN) 75 MG EC tablet Take 1 tablet (75 mg total) by mouth 2 (two) times daily. Patient not taking: Reported on 04/16/2023 04/12/23   Sallyanne Kuster, NP  glucose blood (ACCU-CHEK GUIDE) test strip USE TO TEST BLOOD SUGARS 5 TIMES DAILY 04/01/23   Sallyanne Kuster, NP  Omega-3 Fatty Acids (FISH OIL) 1000 MG CAPS Take 1 capsule by mouth daily. Patient not taking: Reported on 04/16/2023    [provider]  OXYGEN Inhale 4 L into the lungs. PT USES [facility] FOR OXYGEN    [provider]  predniSONE (DELTASONE) 10 MG tablet Take one tab 3 x day for 3 days, then take one tab 2 x a day for 3 days and then take one tab a day for 3 days for copd Patient not taking: Reported on 04/16/2023 12/31/22   McDonough, Lauren K, PA-C  RELION PEN NEEDLES 31G X 6 MM MISC USE 1 PEN NEEDLE WITH INSULIN PEN ONCE DAILY FOR DIABETES 03/23/23   Sallyanne Kuster, NP    Allergies: Allergies  Allergen Reactions   Trelegy Ellipta [Fluticasone-Umeclidin-Vilant]     Had breathing issues    Social History:  reports that she quit smoking about 13 years ago. Her smoking use included cigarettes. She started smoking about 50 years ago. She has a 37 pack-year smoking history. She has quit using smokeless tobacco.  Her smokeless tobacco use included snuff. She reports that she does not currently use alcohol after a past usage of about 5.0 standard drinks of alcohol per week. She reports that  she does not currently use drugs after having used the following drugs: Marijuana, "Crack" cocaine, and Cocaine.   Family History: Family History  Problem Relation Age of Onset   Diabetes Mother    Hypercholesterolemia Mother    Lung cancer Father    Diabetes Sister    Diabetes Sister    Hypertension Sister    Diabetes Maternal Grandmother    Diabetes Paternal Grandmother    Heart attack Brother    Coronary artery disease Brother    Vascular Disease Brother    Heart attack Brother  Breast cancer Neg Hx     Review of Systems: Review of Systems  Constitutional:  Negative for chills and fever.  Respiratory:  Positive for shortness of breath.   Cardiovascular:  Negative for chest pain.  Gastrointestinal:  Negative for abdominal pain, nausea and vomiting.    Physical Exam BP 131/87   Pulse 85   Temp 97.8 F (36.6 C) (Oral)   Resp 12   SpO2 100%  CONSTITUTIONAL: No acute distress HEENT:  Normocephalic, atraumatic, extraocular motion intact. RESPIRATORY:  Normal respiratory effort without pathologic use of accessory muscles, currently on 6L [place] at bedside. CARDIOVASCULAR: Regular rhythm and rate. MUSCULOSKELETAL:  No peripheral edema or cyanosis. SKIN: Skin turgor is normal. There are no pathologic skin lesions.  NEUROLOGIC:  Motor and sensation is grossly normal.  Cranial nerves are grossly intact. PSYCH:  Alert and oriented to person, place and time. Affect is normal.  Laboratory Analysis: Results for orders placed or performed during the hospital encounter of 04/16/23 (from the past 24 hour(s))  CBC with Differential     Status: Abnormal   Collection Time: 04/16/23  3:18 PM  Result Value Ref Range   WBC 7.9 4.0 - 10.5 K/uL   RBC 3.05 (L) 3.87 - 5.11 MIL/uL   Hemoglobin 8.8 (L) 12.0 - 15.0 g/dL   HCT 25.9 (L) 56.3 - 87.5 %   MCV 101.6 (H) 80.0 - 100.0 fL   MCH 28.9 26.0 - 34.0 pg   MCHC 28.4 (L) 30.0 - 36.0 g/dL   RDW 64.3 32.9 - 51.8 %   Platelets 269 150 - 400  K/uL   nRBC 0.0 0.0 - 0.2 %   Neutrophils Relative % 85 %   Neutro Abs 6.7 1.7 - 7.7 K/uL   Lymphocytes Relative 8 %   Lymphs Abs 0.6 (L) 0.7 - 4.0 K/uL   Monocytes Relative 6 %   Monocytes Absolute 0.5 0.1 - 1.0 K/uL   Eosinophils Relative 1 %   Eosinophils Absolute 0.1 0.0 - 0.5 K/uL   Basophils Relative 0 %   Basophils Absolute 0.0 0.0 - 0.1 K/uL   Immature Granulocytes 0 %   Abs Immature Granulocytes 0.02 0.00 - 0.07 K/uL  Comprehensive metabolic panel     Status: Abnormal   Collection Time: 04/16/23  3:18 PM  Result Value Ref Range   Sodium 141 135 - 145 mmol/L   Potassium 4.2 3.5 - 5.1 mmol/L   Chloride 104 98 - 111 mmol/L   CO2 28 22 - 32 mmol/L   Glucose, Bld 111 (H) 70 - 99 mg/dL   BUN 26 (H) 8 - 23 mg/dL   Creatinine, Ser 8.41 0.44 - 1.00 mg/dL   Calcium 8.9 8.9 - 66.0 mg/dL   Total Protein 7.2 6.5 - 8.1 g/dL   Albumin 3.9 3.5 - 5.0 g/dL   AST 23 15 - 41 U/L   ALT 20 0 - 44 U/L   Alkaline Phosphatase 59 38 - 126 U/L   Total Bilirubin 0.4 0.3 - 1.2 mg/dL   GFR, Estimated >63 >01 mL/min   Anion gap 9 5 - 15  CBG monitoring, ED     Status: Abnormal   Collection Time: 04/17/23 12:20 AM  Result Value Ref Range   Glucose-Capillary 182 (H) 70 - 99 mg/dL  Comprehensive metabolic panel     Status: Abnormal   Collection Time: 04/17/23  5:58 AM  Result Value Ref Range   Sodium 139 135 - 145 mmol/L   Potassium 4.5  3.5 - 5.1 mmol/L   Chloride 102 98 - 111 mmol/L   CO2 32 22 - 32 mmol/L   Glucose, Bld 200 (H) 70 - 99 mg/dL   BUN 34 (H) 8 - 23 mg/dL   Creatinine, Ser 1.61 0.44 - 1.00 mg/dL   Calcium 8.3 (L) 8.9 - 10.3 mg/dL   Total Protein 6.3 (L) 6.5 - 8.1 g/dL   Albumin 3.4 (L) 3.5 - 5.0 g/dL   AST 28 15 - 41 U/L   ALT 17 0 - 44 U/L   Alkaline Phosphatase 45 38 - 126 U/L   Total Bilirubin 0.3 0.3 - 1.2 mg/dL   GFR, Estimated >09 >60 mL/min   Anion gap 5 5 - 15  CBC     Status: Abnormal   Collection Time: 04/17/23  5:58 AM  Result Value Ref Range   WBC 5.7 4.0 -  10.5 K/uL   RBC 2.58 (L) 3.87 - 5.11 MIL/uL   Hemoglobin 7.5 (L) 12.0 - 15.0 g/dL   HCT 45.4 (L) 09.8 - 11.9 %   MCV 101.6 (H) 80.0 - 100.0 fL   MCH 29.1 26.0 - 34.0 pg   MCHC 28.6 (L) 30.0 - 36.0 g/dL   RDW 14.7 82.9 - 56.2 %   Platelets 250 150 - 400 K/uL   nRBC 0.0 0.0 - 0.2 %  CBG monitoring, ED     Status: Abnormal   Collection Time: 04/17/23  8:07 AM  Result Value Ref Range   Glucose-Capillary 209 (H) 70 - 99 mg/dL  Urinalysis, Complete w Microscopic -Urine, Clean Catch     Status: Abnormal   Collection Time: 04/17/23  9:30 AM  Result Value Ref Range   Color, Urine STRAW (A) YELLOW   APPearance CLEAR (A) CLEAR   Specific Gravity, Urine 1.014 1.005 - 1.030   pH 6.0 5.0 - 8.0   Glucose, UA NEGATIVE NEGATIVE mg/dL   Hgb urine dipstick MODERATE (A) NEGATIVE   Bilirubin Urine NEGATIVE NEGATIVE   Ketones, ur NEGATIVE NEGATIVE mg/dL   Protein, ur NEGATIVE NEGATIVE mg/dL   Nitrite NEGATIVE NEGATIVE   Leukocytes,Ua NEGATIVE NEGATIVE   RBC / HPF 0-5 0 - 5 RBC/hpf   WBC, UA 0-5 0 - 5 WBC/hpf   Bacteria, UA RARE (A) NONE SEEN   Squamous Epithelial / HPF 0-5 0 - 5 /HPF    Imaging: DG Chest 2 View  Result Date: 04/17/2023 CLINICAL DATA:  67 year old female with history of lung cancer. Right hydropneumothorax, abnormal right middle and lower lobe on recent CT. EXAM: CHEST - 2 VIEW COMPARISON:  Chest CT 04/16/2023 and earlier. FINDINGS: Seated AP and lateral views of the chest at 0559 hours. Persistent right pneumothorax, not significantly changed from the CT and radiographs on 04/16/2023. Pleural fluid component better demonstrated by CT. Mildly improved right lung base ventilation compared to the most recent radiographs with residual streaky and patchy opacity better characterized by CT. Stable cardiac size and mediastinal contours. Visualized tracheal air column is within normal limits. Left lung appears stable, negative. No acute osseous abnormality identified. Negative visible bowel  gas. IMPRESSION: 1. Persistent right hydropneumothorax, no significant change from yesterday. 2. No new cardiopulmonary abnormality. Electronically Signed   By: Odessa Fleming M.D.   On: 04/17/2023 06:21   CT CHEST WO CONTRAST  Result Date: 04/16/2023 CLINICAL DATA:  Pneumothorax. Dyspnea on exertion that started yesterday. Dry cough. EXAM: CT CHEST WITHOUT CONTRAST TECHNIQUE: Multidetector CT imaging of the chest was performed following the standard protocol  without IV contrast. RADIATION DOSE REDUCTION: This exam was performed according to the departmental dose-optimization program which includes automated exposure control, adjustment of the mA and/or kV according to patient size and/or use of iterative reconstruction technique. COMPARISON:  Radiograph 04/16/2023 at 3:35 p.m. and CT chest 03/24/2023 FINDINGS: Cardiovascular: Normal heart size. No pericardial effusion. Leadless pacer. Coronary artery and aortic atherosclerotic calcification. Mediastinum/Nodes: Trachea and esophagus are unremarkable. No thoracic adenopathy. No mediastinal shift Lungs/Pleura: Moderate right apical, anterior, and basilar pneumothorax as seen on same day chest radiograph. Small right pleural effusion. Slightly increased consolidation about the postradiation fibrosis and architectural distortion in the right middle lobe and right lower lobe favored due to volume loss from the pneumothorax. Advanced centrilobular emphysema greatest in the upper lungs. Right apical scarring. Unchanged 4 mm right upper lobe nodule on series 2/image 33. Upper Abdomen: No acute abnormality. Musculoskeletal: Unchanged right rib fractures. IMPRESSION: 1. Moderate right hydropneumothorax.  No evidence of tension. 2. Slightly increased consolidation about the postradiation fibrosis and architectural distortion in the right middle lobe and right lower lobe favored due to volume loss from the pneumothorax. 3. Unchanged 4 mm right upper lobe nodule. Continued  attention on follow-up. Aortic Atherosclerosis (ICD10-I70.0) and Emphysema (ICD10-J43.9). Electronically Signed   By: Minerva Fester M.D.   On: 04/16/2023 18:57   DG Chest 2 View  Result Date: 04/16/2023 CLINICAL DATA:  Dyspnea on exertion since yesterday. EXAM: CHEST - 2 VIEW COMPARISON:  November 18, 2022.  March 24, 2023. FINDINGS: Stable cardiomediastinal silhouette. Stable post treatment change seen in right lung base. Left lung is clear. Small right apical and basilar pneumothorax is noted. Bony thorax is unremarkable. IMPRESSION: Small right apical and basilar pneumothorax is noted. Critical Value/emergent results were called by telephone at the time of interpretation on 04/16/2023 at 4:07 pm to provider Dr. Lenard Lance, who verbally acknowledged these results. Electronically Signed   By: Lupita Raider M.D.   On: 04/16/2023 16:09    Assessment and Plan: This is a 67 y.o. female with right sided hydropneumothorax  --Discussed the case this morning with Dr. Deanne Coffer with Interventional Radiolgy and Dr. Sherryll Burger with Hospitalist team.  Although her CXR shows a stable right pneumothorax, the patient has been very symptomatic with oxygen desaturation with minimal effort in her room in the ER.  Dr. Deanne Coffer has agreed for percutaneous chest tube placement. --Discussed with patient that once chest tube is placed, this would be connected to suction to allow the lung to re-expand.  Will continue to monitor with serial CXR's and change to waterseal and remove chest tube when appropriate.  Discussed with the patient the possibility that the pneumothorax does not fully resolve or shows persistent collapse when trying waterseal.  If this is the case, she may need transfer to [facility] for further management by thoracic surgery team.  I spent 45 minutes dedicated to the care of this patient on the date of this encounter to include pre-visit review of records, face-to-face time with the patient discussing diagnosis and  management, and any post-visit coordination of care.   Howie Ill, MD [facility]:  [phone]

## 2023-04-17 NOTE — Assessment & Plan Note (Signed)
Plan is for repeat chest xray in am and intervene if indicated per general surgery ./ consult message and order placed.

## 2023-04-17 NOTE — ED Notes (Signed)
Inhalers have not yet arrived from pharmacy.

## 2023-04-17 NOTE — ED Notes (Addendum)
Patient transported to CT with this RN for chest tube placement. Consent obtained. Time out preformed

## 2023-04-18 ENCOUNTER — Inpatient Hospital Stay: Payer: 59

## 2023-04-18 DIAGNOSIS — R0602 Shortness of breath: Secondary | ICD-10-CM | POA: Diagnosis not present

## 2023-04-18 DIAGNOSIS — J939 Pneumothorax, unspecified: Secondary | ICD-10-CM | POA: Diagnosis not present

## 2023-04-18 DIAGNOSIS — Z515 Encounter for palliative care: Secondary | ICD-10-CM | POA: Diagnosis not present

## 2023-04-18 LAB — BASIC METABOLIC PANEL
Anion gap: 5 (ref 5–15)
BUN: 24 mg/dL — ABNORMAL HIGH (ref 8–23)
CO2: 32 mmol/L (ref 22–32)
Calcium: 8.7 mg/dL — ABNORMAL LOW (ref 8.9–10.3)
Chloride: 100 mmol/L (ref 98–111)
Creatinine, Ser: 0.61 mg/dL (ref 0.44–1.00)
GFR, Estimated: >60 mL/min (ref >60)
Glucose, Bld: 176 mg/dL — ABNORMAL HIGH (ref 70–99)
Potassium: 4.1 mmol/L (ref 3.5–5.1)
Sodium: 137 mmol/L (ref 135–145)

## 2023-04-18 LAB — CBC
HCT: 27.9 % — ABNORMAL LOW (ref 36.0–46.0)
Hemoglobin: 8.1 g/dL — ABNORMAL LOW (ref 12.0–15.0)
MCH: 28.9 pg (ref 26.0–34.0)
MCHC: 29 g/dL — ABNORMAL LOW (ref 30.0–36.0)
MCV: 99.6 fL (ref 80.0–100.0)
Platelets: 256 10*3/uL (ref 150–400)
RBC: 2.8 MIL/uL — ABNORMAL LOW (ref 3.87–5.11)
RDW: 14.9 % (ref 11.5–15.5)
WBC: 7 10*3/uL (ref 4.0–10.5)
nRBC: 0 % (ref 0.0–0.2)

## 2023-04-18 LAB — GLUCOSE, CAPILLARY
Glucose-Capillary: 167 mg/dL — ABNORMAL HIGH (ref 70–99)
Glucose-Capillary: 171 mg/dL — ABNORMAL HIGH (ref 70–99)
Glucose-Capillary: 136 mg/dL — ABNORMAL HIGH (ref 70–99)
Glucose-Capillary: 250 mg/dL — ABNORMAL HIGH (ref 70–99)

## 2023-04-18 MED ORDER — IPRATROPIUM-ALBUTEROL 0.5-2.5 (3) MG/3ML IN SOLN: 3.0000 mL | Freq: Four times a day (QID) | RESPIRATORY_TRACT | Status: DC

## 2023-04-18 MED ORDER — ALBUTEROL SULFATE HFA 108 (90 BASE) MCG/ACT IN AERS: 2.0000 | INHALATION_SPRAY | RESPIRATORY_TRACT | Status: DC | PRN

## 2023-04-18 MED ORDER — ALBUTEROL SULFATE (2.5 MG/3ML) 0.083% IN NEBU
2.5000 mg | INHALATION_SOLUTION | Freq: Four times a day (QID) | RESPIRATORY_TRACT | Status: DC | PRN
  Administered 2023-04-18: 2.5 mg via RESPIRATORY_TRACT
  Filled 2023-04-18: qty 3

## 2023-04-18 MED ORDER — ALBUTEROL SULFATE (2.5 MG/3ML) 0.083% IN NEBU
2.5000 mg | INHALATION_SOLUTION | Freq: Three times a day (TID) | RESPIRATORY_TRACT | Status: DC
  Administered 2023-04-18 – 2023-04-21 (×7): 2.5 mg via RESPIRATORY_TRACT
  Filled 2023-04-18 (×7): qty 3

## 2023-04-18 MED ORDER — ALBUTEROL SULFATE (2.5 MG/3ML) 0.083% IN NEBU: 2.5000 mg | INHALATION_SOLUTION | RESPIRATORY_TRACT | Status: DC | PRN

## 2023-04-18 MED ORDER — ALUM & MAG HYDROXIDE-SIMETH 200-200-20 MG/5ML PO SUSP
30.0000 mL | ORAL | Status: DC | PRN
  Administered 2023-04-18: 30 mL via ORAL
  Filled 2023-04-18: qty 30

## 2023-04-18 NOTE — Progress Notes (Signed)
PT Cancellation Note  Patient Details Name: Angela Jensen MRN: [id number] DOB: [birth year]   Cancelled Treatment:    Reason Eval/Treat Not Completed: Patient declined, no reason specified (Patient reports she already had therapy today (had occupational therapy) and refused to have another therapy session and get out of bed. Attempted education, will attempt again at another time.)   Precious Bard 04/18/2023, 2:12 PM

## 2023-04-18 NOTE — Progress Notes (Signed)
1      PROGRESS NOTE    Angela Jensen  ZOX:[id number] DOB: 1955-09-24 DOA: 04/16/2023 PCP: Sallyanne Kuster, NP   Brief Narrative:   67 year old female with a known history of COPD and right lung cancer, s/p radiation therapy. She also has a history of a right pneumothorax in 2021 which required chest tube and hospital admission.  She is admitted for worsening shortness of breath and hypoxia.  In the ED she was found to have right-sided hydropneumothorax  8/31: CT-guided right chest tube placement by IR.  Followed by general surgery.  04/18/2023: The patient was seen and examined at bedside.  No acute events reported.  The patient desaturates significantly with ambulation, requiring 6 L.  Seen by general surgery this morning.  Appreciate recommendations.  Patient's medical oncologist Dr. Janyth Contes consulted to assist with the management.   Assessment & Plan:   Principal Problem:   SOB (shortness of breath) Active Problems:   Hydropneumothorax   Chronic combined systolic and diastolic CHF (congestive heart failure) (HCC)   Malignant neoplasm of upper lobe of right lung (HCC)   Essential hypertension   Diabetes (HCC)   PAD (peripheral artery disease) (HCC)  Right hydro pneumothorax status post right chest tube placement. Status post CT-guided right chest tube placement Further management per surgical team Continue to monitor output from chest tube.  Acute on chronic hypoxic respiratory failure At baseline patient uses 4 L oxygen via nasal cannula.  Currently requiring 5 to 6 L oxygen and had to be placed on high flow nasal cannula 8 L with minimal exertion going from bed to bedside commode in the emergency department  Chronic combined systolic and diastolic CHF (congestive heart failure) (HCC) Stable.  Compensated.  Patient is at baseline. Strict I/O. Daily weights.    Malignant neoplasm of upper lobe of right lung (HCC) Pt being followed by oncology Dr. 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 -Recurrence-SBRT completed 06/11/2020.NGS showed TMB >10 muts/mb, MS stable, KRAS G12V, MYC amplification,STK11 S250fs*47, FUBP1 splice sit 473+1G" Outpatient follow-up with oncology   Essential hypertension Blood pressure well-controlled   Diabetes (HCC) SSI for now.   PAD (peripheral artery disease) (HCC) Pt has stents for her PAD and f/u with dr. Lorretta Harp.  Continue Plavix    DVT prophylaxis: Lovenox enoxaparin (LOVENOX) injection 40 mg Start: 04/17/23 2200  Code Status: Full code Family Communication: . NO "discussed with patient" Disposition Plan: (Possible discharge in 2 to 4 days depending on clinical condition chest tube management   Consultants:  General surgery Interventional radiology  Procedures:  Chest tube placement   Subjective: Feeling short of breath.  Objective: Vitals:   04/18/23 0500 04/18/23 0803 04/18/23 0803 04/18/23 1154  BP:  111/80 111/80 105/86  Pulse:  80 79 83  Resp:  18 18 18   Temp:  98.8 F (37.1 C) 98.8 F (37.1 C) 98 F (36.7 C)  TempSrc:      SpO2:  100% 100% 98%  Weight: 57.8 kg      No intake or output data in the 24 hours ending 04/18/23 1336 Filed Weights   04/18/23 0500  Weight: 57.8 kg    Examination:  General exam: Appears calm and comfortable.  Alert and oriented x 3. Respiratory system: Mild rales at bases.  Poor inspiratory effort.  Decreased breath sounds at the bases on the right Cardiovascular system: S1 & S2 heard, RRR. No JVD, murmurs, rubs, gallops or clicks. No pedal edema. Gastrointestinal system:  Abdomen is nondistended, soft and nontender. No organomegaly or masses felt. Normal bowel sounds heard. Central nervous system: Alert and oriented. No focal neurological deficits. Extremities: Symmetric 5 x 5 power.  No peripheral edema. Skin: No rashes, lesions or ulcers Psychiatry: Judgement and insight appear normal. Mood & affect appropriate.     Data  Reviewed: I have personally reviewed following labs and imaging studies  CBC: Recent Labs  Lab 04/16/23 1518 04/17/23 0558 04/18/23 0607  WBC 7.9 5.7 7.0  NEUTROABS 6.7  --   --   HGB 8.8* 7.5* 8.1*  HCT 31.0* 26.2* 27.9*  MCV 101.6* 101.6* 99.6  PLT 269 250 256   Basic Metabolic Panel: Recent Labs  Lab 04/16/23 1518 04/17/23 0558 04/18/23 0607  NA 141 139 137  K 4.2 4.5 4.1  CL 104 102 100  CO2 28 32 32  GLUCOSE 111* 200* 176*  BUN 26* 34* 24*  CREATININE 0.78 0.67 0.61  CALCIUM 8.9 8.3* 8.7*   GFR: Estimated Creatinine Clearance: 52.8 mL/min (by C-G formula based on SCr of 0.61 mg/dL). Liver Function Tests: Recent Labs  Lab 04/16/23 1518 04/17/23 0558  AST 23 28  ALT 20 17  ALKPHOS 59 45  BILITOT 0.4 0.3  PROT 7.2 6.3*  ALBUMIN 3.9 3.4*    CBG: Recent Labs  Lab 04/17/23 1349 04/17/23 1637 04/17/23 2123 04/18/23 0805 04/18/23 1158  GLUCAP 122* 211* 85 167* 171*     Radiology Studies: DG Chest Port 1 View  Result Date: 04/18/2023 CLINICAL DATA:  Follow up pneumothorax. EXAM: PORTABLE CHEST 1 VIEW COMPARISON:  Radiographs 04/17/2023 and 04/16/2023.  CT 04/16/2023. FINDINGS: 0543 hours. Interval placement of a small caliber pigtail pleural catheter on the right, projecting over the right lateral lung apex. Minimal residual right apical pneumothorax. There is stable scarring at the right lung base. The lungs are otherwise clear. No pleural effusion. The heart size and mediastinal contours are stable with a leadless intracardiac pacemaker. Soft tissue emphysema noted about the chest tube insertion in the right lateral chest wall. No acute osseous findings. IMPRESSION: 1. Minimal residual right apical pneumothorax following chest tube placement. No other significant changes. 2. Stable right basilar scarring. Electronically Signed   By: Carey Bullocks M.D.   On: 04/18/2023 09:03   CT [facility] PLEURAL DRAIN W/INDWELL CATH W/IMG GUIDE  Result Date:  04/17/2023 INDICATION: Symptomatic right pneumothorax EXAM: CT-GUIDED CHEST TUBE PLACEMENT MEDICATIONS: No periprocedural antibiotics were indicated ANESTHESIA/SEDATION: Lidocaine 1% subcutaneous COMPLICATIONS: None immediate. PROCEDURE: Informed written consent was obtained from the patient after a thorough discussion of the procedural risks, benefits and alternatives. All questions were addressed. Maximal Sterile Barrier Technique was utilized including caps, mask, sterile gowns, sterile gloves, sterile drape, hand hygiene and skin antiseptic. A timeout was performed prior to the initiation of the procedure. Select scans through the thorax obtained. Appropriate skin entry site was determined and marked. Region was prepped with chlorhexidine, draped in usual sterile fashion, infiltrated locally with 1% lidocaine. 7 cm Yueh sheath needle was advanced into the pleural space. Gas could be aspirated. Amplatz guidewire advanced easily. Tract dilated to facilitate placement of a 14 French pigtail drain catheter. Catheter was placed to Pleur-Evac suction device. Confirmatory CT demonstrated good catheter position directed towards the apex, with virtually complete evacuation of the pneumothorax. Small volume subcutaneous emphysema. The catheter was secured externally with 0 Prolene suture and StatLock and a sterile dressing applied. The patient tolerated the procedure well. RADIATION DOSE REDUCTION: This exam was performed according  to the departmental dose-optimization program which includes automated exposure control, adjustment of the mA and/or kV according to patient size and/or use of iterative reconstruction technique. IMPRESSION: 1. Technically successful CT-guided right chest tube placement. Electronically Signed   By: Corlis Leak M.D.   On: 04/17/2023 14:29   DG Chest 2 View  Result Date: 04/17/2023 CLINICAL DATA:  67 year old female with history of lung cancer. Right hydropneumothorax, abnormal right middle  and lower lobe on recent CT. EXAM: CHEST - 2 VIEW COMPARISON:  Chest CT 04/16/2023 and earlier. FINDINGS: Seated AP and lateral views of the chest at 0559 hours. Persistent right pneumothorax, not significantly changed from the CT and radiographs on 04/16/2023. Pleural fluid component better demonstrated by CT. Mildly improved right lung base ventilation compared to the most recent radiographs with residual streaky and patchy opacity better characterized by CT. Stable cardiac size and mediastinal contours. Visualized tracheal air column is within normal limits. Left lung appears stable, negative. No acute osseous abnormality identified. Negative visible bowel gas. IMPRESSION: 1. Persistent right hydropneumothorax, no significant change from yesterday. 2. No new cardiopulmonary abnormality. Electronically Signed   By: Odessa Fleming M.D.   On: 04/17/2023 06:21   CT CHEST WO CONTRAST  Result Date: 04/16/2023 CLINICAL DATA:  Pneumothorax. Dyspnea on exertion that started yesterday. Dry cough. EXAM: CT CHEST WITHOUT CONTRAST TECHNIQUE: Multidetector CT imaging of the chest was performed following the standard protocol without IV contrast. RADIATION DOSE REDUCTION: This exam was performed according to the departmental dose-optimization program which includes automated exposure control, adjustment of the mA and/or kV according to patient size and/or use of iterative reconstruction technique. COMPARISON:  Radiograph 04/16/2023 at 3:35 p.m. and CT chest 03/24/2023 FINDINGS: Cardiovascular: Normal heart size. No pericardial effusion. Leadless pacer. Coronary artery and aortic atherosclerotic calcification. Mediastinum/Nodes: Trachea and esophagus are unremarkable. No thoracic adenopathy. No mediastinal shift Lungs/Pleura: Moderate right apical, anterior, and basilar pneumothorax as seen on same day chest radiograph. Small right pleural effusion. Slightly increased consolidation about the postradiation fibrosis and architectural  distortion in the right middle lobe and right lower lobe favored due to volume loss from the pneumothorax. Advanced centrilobular emphysema greatest in the upper lungs. Right apical scarring. Unchanged 4 mm right upper lobe nodule on series 2/image 33. Upper Abdomen: No acute abnormality. Musculoskeletal: Unchanged right rib fractures. IMPRESSION: 1. Moderate right hydropneumothorax.  No evidence of tension. 2. Slightly increased consolidation about the postradiation fibrosis and architectural distortion in the right middle lobe and right lower lobe favored due to volume loss from the pneumothorax. 3. Unchanged 4 mm right upper lobe nodule. Continued attention on follow-up. Aortic Atherosclerosis (ICD10-I70.0) and Emphysema (ICD10-J43.9). Electronically Signed   By: Minerva Fester M.D.   On: 04/16/2023 18:57   DG Chest 2 View  Result Date: 04/16/2023 CLINICAL DATA:  Dyspnea on exertion since yesterday. EXAM: CHEST - 2 VIEW COMPARISON:  November 18, 2022.  March 24, 2023. FINDINGS: Stable cardiomediastinal silhouette. Stable post treatment change seen in right lung base. Left lung is clear. Small right apical and basilar pneumothorax is noted. Bony thorax is unremarkable. IMPRESSION: Small right apical and basilar pneumothorax is noted. Critical Value/emergent results were called by telephone at the time of interpretation on 04/16/2023 at 4:07 pm to provider Dr. Lenard Lance, who verbally acknowledged these results. Electronically Signed   By: Lupita Raider M.D.   On: 04/16/2023 16:09        Scheduled Meds:  clopidogrel  75 mg Oral Daily   enoxaparin (LOVENOX)  injection  40 mg Subcutaneous Q24H   escitalopram  10 mg Oral Daily   insulin aspart  0-15 Units Subcutaneous TID WC   ipratropium-albuterol  3 mL Nebulization Q6H   metoprolol succinate  25 mg Oral Daily   mometasone-formoterol  2 puff Inhalation BID   sodium chloride flush  3 mL Intravenous Q12H   tiotropium  18 mcg Inhalation Daily    Continuous Infusions:   LOS: 1 day    Time spent: 5 minutes    Darlin Drop, MD [facility] Pager [phone]  If 7PM-7AM, please contact night-coverage [email or web address]  04/18/2023, 1:36 PM

## 2023-04-18 NOTE — Progress Notes (Signed)
Daily Progress Note   Patient Name: Angela Jensen       Date: 04/18/2023 DOB: [birth year]  Age: 67 y.o. MRN#: [id number] Attending Physician: Darlin Drop, DO Primary Care Physician: Angela Kuster, NP Admit Date: 04/16/2023  Reason for Consultation/Follow-up: Establishing goals of care  HPI/Brief Hospital Review:  67 y.o. female  with past medical history of lung cancer in 2012 s/p radiation therapy, OSA, COPD with chronic respiratory failure on 3L [place] at baseline, CAD, HTN, HLD and CHF admitted from home on 04/16/2023 with shortness of breath.   CT chest 8/30 IMPRESSION: 1. Moderate right hydropneumothorax.  No evidence of tension. 2. Slightly increased consolidation about the postradiation fibrosis and architectural distortion in the right middle lobe and right lower lobe favored due to volume loss from the pneumothorax. 3. Unchanged 4 mm right upper lobe nodule. Continued attention on follow-up.   Repeat CXR 8/31 IMPRESSION: 1. Persistent right hydropneumothorax, no significant change from yesterday. 2. No new cardiopulmonary abnormality.   ED staff reports significant hypoxia and dyspnea with minimal exertion in bed   Evaluated by general surgery, IR consulted--percutaneous chest tube placed 8/31   Palliative medicine was consulted for assisting with goals of care conversations  Subjective: Extensive chart review has been completed prior to meeting patient including labs, vital signs, imaging, progress notes, orders, and available advanced directive documents from current and previous encounters.    Visited with Angela Jensen at her bedside. Awake, alert, able to engage in conversation. Chest tube in place. Reports feeling much better with much improved shortness of breath. She  was able to ambulate around room without symptoms this AM.  Daughter at bedside during time of visit.  Goals remain clear. Answered and addressed all questions and concerns. No ongoing acute palliative needs at this time. PMT will shadow and please reengage if needed.  Thank you for allowing the Palliative Medicine Team to assist in the care of this patient.  Total time:  25 minutes  Time spent includes: Detailed review of medical records (labs, imaging, vital signs), medically appropriate exam (mental status, respiratory, cardiac, skin), discussed with treatment team, counseling and educating patient, family and staff, documenting clinical information, medication management and coordination of care.  Leeanne Deed, DNP, AGNP-C Palliative Medicine   Please contact Palliative Medicine Team phone at [phone] for  questions and concerns.

## 2023-04-18 NOTE — Progress Notes (Signed)
04/18/2023  Subjective: No acute events overnight.  Patient had a percutaneous chest tube placed by interventional radiology yesterday.  Patient reports that she feels that she can breathe little bit better but does also reports soreness in the right chest from the chest tube itself.  Chest x-ray done this morning personally reviewed which shows only a small residual apical pneumothorax.  Vital signs: Temp:  [98 F (36.7 C)-98.8 F (37.1 C)] 98.8 F (37.1 C) (09/01 0803) Pulse Rate:  [79-90] 79 (09/01 0803) Resp:  [17-19] 18 (09/01 0803) BP: (111-156)/(58-105) 111/80 (09/01 0803) SpO2:  [95 %-100 %] 100 % (09/01 0803) Weight:  [57.8 kg] 57.8 kg (09/01 0500)   Intake/Output: No intake/output data recorded.    Physical Exam: Constitutional: No acute distress Pulm: Right chest tube in place to suction with small airleak on coughing.  Dressing intact.  Labs:  Recent Labs    04/17/23 0558 04/18/23 0607  WBC 5.7 7.0  HGB 7.5* 8.1*  HCT 26.2* 27.9*  PLT 250 256   Recent Labs    04/17/23 0558 04/18/23 0607  NA 139 137  K 4.5 4.1  CL 102 100  CO2 32 32  GLUCOSE 200* 176*  BUN 34* 24*  CREATININE 0.67 0.61  CALCIUM 8.3* 8.7*   No results for input(s): "LABPROT", "INR" in the last 72 hours.  Imaging: DG Chest Port 1 View  Result Date: 04/18/2023 CLINICAL DATA:  Follow up pneumothorax. EXAM: PORTABLE CHEST 1 VIEW COMPARISON:  Radiographs 04/17/2023 and 04/16/2023.  CT 04/16/2023. FINDINGS: 0543 hours. Interval placement of a small caliber pigtail pleural catheter on the right, projecting over the right lateral lung apex. Minimal residual right apical pneumothorax. There is stable scarring at the right lung base. The lungs are otherwise clear. No pleural effusion. The heart size and mediastinal contours are stable with a leadless intracardiac pacemaker. Soft tissue emphysema noted about the chest tube insertion in the right lateral chest wall. No acute osseous findings.  IMPRESSION: 1. Minimal residual right apical pneumothorax following chest tube placement. No other significant changes. 2. Stable right basilar scarring. Electronically Signed   By: Carey Bullocks M.D.   On: 04/18/2023 09:03   CT [facility] PLEURAL DRAIN W/INDWELL CATH W/IMG GUIDE  Result Date: 04/17/2023 INDICATION: Symptomatic right pneumothorax EXAM: CT-GUIDED CHEST TUBE PLACEMENT MEDICATIONS: No periprocedural antibiotics were indicated ANESTHESIA/SEDATION: Lidocaine 1% subcutaneous COMPLICATIONS: None immediate. PROCEDURE: Informed written consent was obtained from the patient after a thorough discussion of the procedural risks, benefits and alternatives. All questions were addressed. Maximal Sterile Barrier Technique was utilized including caps, mask, sterile gowns, sterile gloves, sterile drape, hand hygiene and skin antiseptic. A timeout was performed prior to the initiation of the procedure. Select scans through the thorax obtained. Appropriate skin entry site was determined and marked. Region was prepped with chlorhexidine, draped in usual sterile fashion, infiltrated locally with 1% lidocaine. 7 cm Yueh sheath needle was advanced into the pleural space. Gas could be aspirated. Amplatz guidewire advanced easily. Tract dilated to facilitate placement of a 14 French pigtail drain catheter. Catheter was placed to Pleur-Evac suction device. Confirmatory CT demonstrated good catheter position directed towards the apex, with virtually complete evacuation of the pneumothorax. Small volume subcutaneous emphysema. The catheter was secured externally with 0 Prolene suture and StatLock and a sterile dressing applied. The patient tolerated the procedure well. RADIATION DOSE REDUCTION: This exam was performed according to the departmental dose-optimization program which includes automated exposure control, adjustment of the mA and/or kV according to  patient size and/or use of iterative reconstruction technique.  IMPRESSION: 1. Technically successful CT-guided right chest tube placement. Electronically Signed   By: Corlis Leak M.D.   On: 04/17/2023 14:29    Assessment/Plan: This is a 67 y.o. female with a right sided pneumothorax.  - Discussed with the patient the findings on her x-ray today which show improvement in the pneumothorax with only a residual small apical area of collapse.  She does have a very small air leak so discussed with her that we will keep her chest tube to suction still for the next 24 hours.  Will obtain another x-ray in the morning and reassess at that point whether we can convert to waterseal versus continue suction depending on any air leaks present. - From ambulation standpoint, the patient may ambulate ideally with portable suction if it is available.  Otherwise chest tube can be temporarily transition to waterseal only for ambulation purposes.  Will be important to remember to reconnect the chest tube to suction again once she is back in her room.   I spent 35 minutes dedicated to the care of this patient on the date of this encounter to include pre-visit review of records, face-to-face time with the patient discussing diagnosis and management, and any post-visit coordination of care.  Howie Ill, MD [facility]

## 2023-04-18 NOTE — Evaluation (Signed)
Occupational Therapy Evaluation Patient Details Name: Angela Jensen MRN: [id number] DOB: 02-13-1956 Today's Date: 04/18/2023   History of Present Illness Angela Jensen is a 67 y.o. female with medical history significant for shortness of breath.  Patient and has history of lung cancer and is on home O2 at 3 L.  Patient for a month has been short of breath.  No reports of fever hemoptysis cough congestion diarrhea chest pain palpitations any alleviating or worsening factors.  Patient has been using her inhalers and nebulizer treatments without relief.  Patient also reports that her left leg has been cramping intermittently follows up with Dr. Gilda Crease Dr. [facility] vascular surgery and has stents in her advised her to follow-up in clinic be related to circulation pulses in the left leg is 1+ right leg is 2+.  No reports of falls.   Clinical Impression   Patient received for OT evaluation. See flowsheet below for details of function. Generally, patient requiring supervision for bed mobility, supervision with RW and assist for lines/leads management for functional mobility, and supervision-MIN A for ADLs. O2 dropping with ADLs, even with OT increasing from 4-6L during session. MD and RN aware. Patient will benefit from continued OT while in acute care.        If plan is discharge home, recommend the following: A little help with walking and/or transfers;A little help with bathing/dressing/bathroom;Assistance with cooking/housework;Assist for transportation;Help with stairs or ramp for entrance    Functional Status Assessment  Patient has had a recent decline in their functional status and demonstrates the ability to make significant improvements in function in a reasonable and predictable amount of time.  Equipment Recommendations  None recommended by OT    Recommendations for Other Services       Precautions / Restrictions Precautions Precautions: Fall (oxygen saturation; new chest tube) Precaution  Comments: watch O2 Restrictions Weight Bearing Restrictions: No      Mobility Bed Mobility Overal bed mobility: Needs Assistance Bed Mobility: Supine to Sit, Sit to Supine     Supine to sit: Supervision, HOB elevated, Used rails Sit to supine: Supervision, HOB elevated   General bed mobility comments: increased pain with movement at chest tube site    Transfers Overall transfer level: Needs assistance Equipment used: Rolling walker (2 wheels) Transfers: Sit to/from Stand Sit to Stand: Supervision           General transfer comment: Pt mobilized with RW into bathroom with supervision and OT managing lines/leads. Pt with forward trunk flexion; states this is 2/2 pain and typically she walks upright.      Balance Overall balance assessment: Mild deficits observed, not formally tested                                         ADL either performed or assessed with clinical judgement   ADL Overall ADL's : Needs assistance/impaired   Eating/Feeding Details (indicate cue type and reason): anticipate (I) Grooming: Wash/dry hands;Set up;Supervision/safety;Standing Grooming Details (indicate cue type and reason): washed hands after using the toilet; OT managing lines/leads throughout   Upper Body Bathing Details (indicate cue type and reason): anticipate (I)   Lower Body Bathing Details (indicate cue type and reason): anticipate MIN A 2/2 tube currently; Upper Body Dressing : Minimal assistance;Standing Upper Body Dressing Details (indicate cue type and reason): daughter assisting while pt standing at [facility]   Lower Body  Dressing Details (indicate cue type and reason): anticipate pt to require assistance with LB dressing 2/2 chest pain from tube; anticipate once tube removed pt return to prior level of independence Toilet Transfer: Supervision/safety;Set up;Regular Toilet;Grab bars   Toileting- Clothing Manipulation and Hygiene: Supervision/safety;Set  up;Sitting/lateral lean Toileting - Clothing Manipulation Details (indicate cue type and reason): bowel hygiene; daughter handed her the toilet paper/wipe     Functional mobility during ADLs: Rolling walker (2 wheels);Supervision/safety (assistance for chest tube, oxygen tube, and telemetry box) General ADL Comments: Pt with decreased activity tolerance 2/2 pain from chest tube when up and moving. Pt denies pain at rest.     Vision Patient Visual Report: No change from baseline       Perception         Praxis         Pertinent Vitals/Pain Pain Assessment Pain Assessment: 0-10 Pain Score: 10-Worst pain ever Pain Location: R side chest at chest tube insertion site Pain Descriptors / Indicators: Aching, Sore Pain Intervention(s): Limited activity within patient's tolerance, Monitored during session, Premedicated before session     Extremity/Trunk Assessment Upper Extremity Assessment Upper Extremity Assessment: Overall WFL for tasks assessed   Lower Extremity Assessment Lower Extremity Assessment: Overall WFL for tasks assessed       Communication Communication Communication: No apparent difficulties   Cognition Arousal: Alert Behavior During Therapy: WFL for tasks assessed/performed Overall Cognitive Status: Within Functional Limits for tasks assessed                                       General Comments  O2 on 4L dropped to 78% with walk to bathroom; O2 increased to 6L and took her several minutes seated rest on toilet to recover to 88%. After washing hands and walking back to bed, O2 79% on 6L and recovered to 98% after approx 3 minutes.  O2 returned to 4L at rest and she maintained. Pt did state that she check it at home and often it drops during mobility to 70's. She uses 3L at rest and 4L with activity at home.    Exercises     Shoulder Instructions      Home Living Family/patient expects to be discharged to:: Private residence Living  Arrangements: Spouse/significant other (long term boyfriend Reuel Boom) Available Help at Discharge: Family;Available 24 hours/day (boyfriend can assist 24/7; adult daughters intermitently) Type of Home: House Home Access: Stairs to enter [employer] of Steps: 3 Entrance Stairs-Rails: Right;Left;Can reach both Home Layout: One level     Bathroom Shower/Tub: Chief Strategy Officer: Standard     Home Equipment: Agricultural consultant (2 wheels);Cane - single point;BSC/3in1;Wheelchair - manual          Prior Functioning/Environment Prior Level of Function : Independent/Modified Independent;Driving             Mobility Comments: Reports no recent AD use; no falls for approx 2 years. ADLs Comments: (I) ADLs and IADLs, drives, does most housework. (I) medication management. Partner does lawn care and takes out the trash. States she enjoys sleeping.        OT Problem List: Decreased activity tolerance;Impaired balance (sitting and/or standing);Pain      OT Treatment/Interventions: Self-care/ADL training;Therapeutic exercise;DME and/or AE instruction;Therapeutic activities;Patient/family education    OT Goals(Current goals can be found in the care plan section) Acute Rehab OT Goals Patient Stated Goal: Pain to improve; return  to prior level of function OT Goal Formulation: With patient/family Time For Goal Achievement: 05/02/23 Potential to Achieve Goals: Good ADL Goals Pt Will Perform Grooming: with modified independence;standing Pt Will Perform Lower Body Bathing: with modified independence;sit to/from stand Pt Will Perform Lower Body Dressing: with modified independence;sit to/from stand Pt Will Transfer to Toilet: with modified independence;ambulating;regular height toilet  OT Frequency: Min 1X/week    Co-evaluation              AM-PAC OT "6 Clicks" Daily Activity     Outcome Measure Help from another person eating meals?: None Help from another  person taking care of personal grooming?: A Little Help from another person toileting, which includes using toliet, bedpan, or urinal?: A Little Help from another person bathing (including washing, rinsing, drying)?: A Little Help from another person to put on and taking off regular upper body clothing?: A Little Help from another person to put on and taking off regular lower body clothing?: A Lot 6 Click Score: 18   End of Session Equipment Utilized During Treatment: Oxygen;Rolling walker (2 wheels) Nurse Communication: Mobility status;Other (comment) (O2 status)  Activity Tolerance: Patient limited by pain Patient left: in bed;with call bell/phone within reach;with family/visitor present  OT Visit Diagnosis: Unsteadiness on feet (R26.81);Pain Pain - part of body:  (chest)                Time: 1610-9604 OT Time Calculation (min): 31 min Charges:  OT General Charges $OT Visit: 1 Visit OT Evaluation $OT Eval Moderate Complexity: 1 Mod OT Treatments $Self Care/Home Management : 8-22 mins  Linward Foster, MS, OTR/L  Alvester Morin 04/18/2023, 10:26 AM

## 2023-04-19 ENCOUNTER — Inpatient Hospital Stay: Payer: 59

## 2023-04-19 DIAGNOSIS — J939 Pneumothorax, unspecified: Secondary | ICD-10-CM | POA: Diagnosis not present

## 2023-04-19 DIAGNOSIS — R0602 Shortness of breath: Secondary | ICD-10-CM | POA: Diagnosis not present

## 2023-04-19 LAB — GLUCOSE, CAPILLARY
Glucose-Capillary: 163 mg/dL — ABNORMAL HIGH (ref 70–99)
Glucose-Capillary: 235 mg/dL — ABNORMAL HIGH (ref 70–99)
Glucose-Capillary: 171 mg/dL — ABNORMAL HIGH (ref 70–99)
Glucose-Capillary: 228 mg/dL — ABNORMAL HIGH (ref 70–99)

## 2023-04-19 LAB — CBC
HCT: 24.3 % — ABNORMAL LOW (ref 36.0–46.0)
Hemoglobin: 7.4 g/dL — ABNORMAL LOW (ref 12.0–15.0)
MCH: 29.5 pg (ref 26.0–34.0)
MCHC: 30.5 g/dL (ref 30.0–36.0)
MCV: 96.8 fL (ref 80.0–100.0)
Platelets: 229 10*3/uL (ref 150–400)
RBC: 2.51 MIL/uL — ABNORMAL LOW (ref 3.87–5.11)
RDW: 14.6 % (ref 11.5–15.5)
WBC: 6.3 10*3/uL (ref 4.0–10.5)
nRBC: 0 % (ref 0.0–0.2)

## 2023-04-19 LAB — PROCALCITONIN: Procalcitonin: <0.1 ng/mL

## 2023-04-19 LAB — LACTIC ACID, PLASMA: Lactic Acid, Venous: 0.7 mmol/L (ref 0.5–1.9)

## 2023-04-19 MED ORDER — HYDROCORTISONE 1 % EX CREA
TOPICAL_CREAM | Freq: Three times a day (TID) | CUTANEOUS | Status: DC | PRN
  Filled 2023-04-19: qty 28

## 2023-04-19 NOTE — Evaluation (Signed)
Physical Therapy Evaluation Patient Details Name: Angela Jensen MRN: [id number] DOB: [birth year] Today's Date: 04/19/2023  History of Present Illness  Angela Jensen is a 67 y.o. female with medical history significant for shortness of breath.  Patient and has history of lung cancer and is on home O2 at 3 L.  Patient for a month has been short of breath.  No reports of fever hemoptysis cough congestion diarrhea chest pain palpitations any alleviating or worsening factors.  Patient has been using her inhalers and nebulizer treatments without relief.  Patient also reports that her left leg has been cramping intermittently follows up with Dr. Gilda Crease Dr. [facility] vascular surgery and has stents in her advised her to follow-up in clinic be related to circulation pulses in the left leg is 1+ right leg is 2+.  No reports of falls.   Clinical Impression  Patient received in bed, she is agreeable to PT assessment. Patient has daughter in room. She is mod I with bed mobility and transfers. Ambulated 175 feet with RW and supervision. A needed for lines only. Cues for safety with mobility. She will continue to benefit from skilled PT to improve safety, endurance and strength. O2 saturations drop on 4 liters with mobility into low 80%s.        If plan is discharge home, recommend the following: A little help with walking and/or transfers;A little help with bathing/dressing/bathroom;Assist for transportation;Help with stairs or ramp for entrance;Assistance with cooking/housework   Can travel by private vehicle        Equipment Recommendations None recommended by PT (patient has walker)  Recommendations for Other Services       Functional Status Assessment Patient has had a recent decline in their functional status and demonstrates the ability to make significant improvements in function in a reasonable and predictable amount of time.     Precautions / Restrictions Precautions Precautions: Fall Precaution  Comments: watch O2, chest tube Restrictions Weight Bearing Restrictions: No      Mobility  Bed Mobility Overal bed mobility: Modified Independent Bed Mobility: Supine to Sit, Sit to Supine     Supine to sit: Modified independent (Device/Increase time), Used rails Sit to supine: Modified independent (Device/Increase time)   General bed mobility comments: no assist needed    Transfers Overall transfer level: Modified independent Equipment used: Rolling walker (2 wheels) Transfers: Sit to/from Stand Sit to Stand: Modified independent (Device/Increase time)           General transfer comment: stood from low commode and bed without physical assist.    Ambulation/Gait Ambulation/Gait assistance: Supervision Gait Distance (Feet): 175 Feet Assistive device: Rolling walker (2 wheels) Gait Pattern/deviations: Step-through pattern Gait velocity: WNL     General Gait Details: patient ambulates with supervision using RW. assistance needed to manage lines only  Stairs            Wheelchair Mobility     Tilt Bed    Modified Rankin (Stroke Patients Only)       Balance Overall balance assessment: Modified Independent                                           Pertinent Vitals/Pain Pain Assessment Pain Location: R side chest at chest tube insertion site Pain Descriptors / Indicators: Discomfort Pain Intervention(s): Monitored during session    Home Living Family/patient expects to be discharged to::  Private residence Living Arrangements: Spouse/significant other Available Help at Discharge: Family;Available 24 hours/day Type of Home: House Home Access: Stairs to enter Entrance Stairs-Rails: Right;Left;Can reach both Entrance Stairs-Number of Steps: 3   Home Layout: One level Home Equipment: Agricultural consultant (2 wheels);Cane - single point;BSC/3in1;Wheelchair - manual      Prior Function Prior Level of Function : Independent/Modified  Independent;Driving             Mobility Comments: Reports no recent AD use; no falls for approx 2 years. ADLs Comments: (I) ADLs and IADLs, drives, does most housework. (I) medication management. Partner does lawn care and takes out the trash. States she enjoys sleeping.     Extremity/Trunk Assessment   Upper Extremity Assessment Upper Extremity Assessment: Defer to OT evaluation    Lower Extremity Assessment Lower Extremity Assessment: Overall WFL for tasks assessed    Cervical / Trunk Assessment Cervical / Trunk Assessment: Normal  Communication   Communication Communication: No apparent difficulties Cueing Techniques: Verbal cues  Cognition Arousal: Alert Behavior During Therapy: WFL for tasks assessed/performed Overall Cognitive Status: Within Functional Limits for tasks assessed                                          General Comments      Exercises     Assessment/Plan    PT Assessment Patient needs continued PT services  PT Problem List Decreased strength;Decreased activity tolerance;Decreased mobility;Decreased safety awareness       PT Treatment Interventions DME instruction;Therapeutic activities;Therapeutic exercise;Gait training;Patient/family education    PT Goals (Current goals can be found in the Care Plan section)  Acute Rehab PT Goals Patient Stated Goal: to improve, go home PT Goal Formulation: With patient Time For Goal Achievement: 04/26/23 Potential to Achieve Goals: Good    Frequency Min 1X/week     Co-evaluation               AM-PAC PT "6 Clicks" Mobility  Outcome Measure Help needed turning from your back to your side while in a flat bed without using bedrails?: None Help needed moving from lying on your back to sitting on the side of a flat bed without using bedrails?: None Help needed moving to and from a bed to a chair (including a wheelchair)?: None   Help needed to walk in hospital room?: A  Little Help needed climbing 3-5 steps with a railing? : A Little 6 Click Score: 18    End of Session Equipment Utilized During Treatment: Oxygen Activity Tolerance: Patient tolerated treatment well Patient left: in bed;with call bell/phone within reach;with family/visitor present Nurse Communication: Mobility status PT Visit Diagnosis: Muscle weakness (generalized) (M62.81)    Time: 2376-2831 PT Time Calculation (min) (ACUTE ONLY): 23 min   Charges:   PT Evaluation $PT Eval Moderate Complexity: 1 Mod PT Treatments $Gait Training: 8-22 mins PT General Charges $$ ACUTE PT VISIT: 1 Visit         Sumiya Mamaril, PT, GCS 04/19/23,11:35 AM

## 2023-04-19 NOTE — TOC Initial Note (Signed)
Transition of Care [facility]) - Initial/Assessment Note    Patient Details  Name: Angela Jensen MRN: [id number] Date of Birth: 02-15-1956  Transition of [facility]) CM/SW Contact:    Truddie Hidden, RN Phone Number: 04/19/2023, 3:44 PM  Clinical Narrative:                 Spoke with patient regarding therapy's recommendation for [facility] PT. She is agreeable and does not have a choice of HH agency. She had been advised the accepting [facility] agency will contact her directly to [facility]  Referral sent to [facility] from [place].          Patient Goals and CMS Choice            Expected Discharge Plan and Services                                              Prior Living Arrangements/Services                       Activities of Daily Living      Permission Sought/Granted                  Emotional Assessment              Admission diagnosis:  SOB (shortness of breath) [R06.02] Pneumothorax, unspecified type [J93.9] Patient Active Problem List   Diagnosis Date Noted   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   Acute pain of right shoulder 11/30/2022   Anxiety and depression 11/19/2022   GERD without esophagitis 11/19/2022   Uncontrolled type 2 diabetes mellitus with hyperglycemia, with long-term current use of insulin (HCC) 11/19/2022   Right rib fracture 09/23/2022   Symptomatic bradycardia 07/09/2021   Chronic hypoxemic respiratory failure (HCC)    CAP (community acquired pneumonia) 06/26/2021   COPD exacerbation (HCC) 06/11/2021   SOB (shortness of breath) 05/01/2021   HCAP (healthcare-associated pneumonia) 05/01/2021   Chronic combined systolic and diastolic CHF (congestive heart failure) (HCC) 05/01/2021   Acute exacerbation of chronic obstructive pulmonary disease (COPD) (HCC) 04/30/2021   Elevated troponin 04/01/2021   Acute on chronic respiratory failure with hypoxia (HCC) 03/31/2021   Hydropneumothorax  05/03/2020   Pneumothorax on right 05/01/2020   Chest pain on breathing 04/21/2020   Pleuritic chest pain 04/11/2020   Right lower lobe pulmonary nodule 04/02/2020   Normocytic anemia 04/02/2020   Pica in adults 04/02/2020   Goals of care, counseling/discussion 03/14/2020   Pain in left wrist 09/24/2019   Encounter for general adult medical examination with abnormal findings 06/28/2019   Elevated total protein 01/22/2019   Uncontrolled type 2 diabetes mellitus with hyperglycemia (HCC) 05/16/2018   Chronic obstructive pulmonary disease (HCC) 05/16/2018   Vitamin D deficiency 05/16/2018   Flu vaccine need 05/16/2018   Dysuria 05/16/2018   Cancer of lower lobe of right lung (HCC) 01/21/2018   Screening for breast cancer 10/12/2017   Personal history of tobacco use, presenting hazards to health 02/03/2017   PAD (peripheral artery disease) (HCC) 06/22/2016   Essential hypertension 05/26/2016   Diabetes (HCC) 05/26/2016   Hyperlipidemia 05/26/2016   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/2016   Uterine leiomyoma 04/30/2016   Elevated CEA 01/31/2016   Special screening for malignant neoplasms, colon    Benign neoplasm of  sigmoid colon    Primary lung cancer (HCC)    Malignant neoplasm of upper lobe of right lung (HCC)    PCP:  Sallyanne Kuster, NP Pharmacy:   [facility] [address] ([address]), [place] - [address]) [place] [address] Phone: [phone] Fax: [phone]     Social Determinants of Health (SDOH) Social History: SDOH Screenings   Food Insecurity: No Food Insecurity (11/19/2022)  Housing: Low Risk  (11/19/2022)  Transportation Needs: No Transportation Needs (11/19/2022)  Utilities: Not At Risk (11/19/2022)  Alcohol Screen: Low Risk  (12/11/2021)  Depression (PHQ2-9): Low Risk  (10/08/2022)  Tobacco Use: Medium Risk (04/16/2023)   SDOH Interventions:      Readmission Risk Interventions    06/13/2021    2:40 PM 05/05/2021   12:24 PM  Readmission Risk Prevention Plan  Transportation Screening Complete Complete  PCP or Specialist Appt within 5-7 Days  Complete  Home Care Screening  Complete  Medication Review (RN CM)  Complete  Social Work Consult for Recovery Care Planning/Counseling Complete   Palliative Care Screening Not Applicable   Medication Review Oceanographer) Complete

## 2023-04-19 NOTE — Progress Notes (Signed)
1      PROGRESS NOTE    Angela Jensen  ZOX:[id number] DOB: 1956/07/24 DOA: 04/16/2023 PCP: Sallyanne Kuster, NP   Brief Narrative:   67 year old female with a known history of COPD, right lung cancer, s/p radiation therapy, right pneumothorax in 2021 which required chest tube and hospital admission.  Admitted for worsening shortness of breath and acute hypoxia.  Work up revealed right-sided hydropneumothorax for which a chest tube was placed by IR on 04/17/2023.  General surgery following for chest tube management.  Dr. Janyth Contes, medical oncology, consulted to assist with the management 9/1.  04/19/2023: The patient was seen and examined with her daughter at the bedside.  She has no new complaints.  More output noted from her chest tube.   Assessment & Plan:   Principal Problem:   SOB (shortness of breath) Active Problems:   Hydropneumothorax   Chronic combined systolic and diastolic CHF (congestive heart failure) (HCC)   Malignant neoplasm of upper lobe of right lung (HCC)   Essential hypertension   Diabetes (HCC)   PAD (peripheral artery disease) (HCC)  Right hydro pneumothorax status post right chest tube placement. Status post CT-guided right chest tube placement Further management per surgical team Continue to monitor output from R chest tube.  Acute on chronic hypoxic respiratory failure At baseline patient uses 4 L oxygen via nasal cannula.  Currently improved and at baseline.  O2 requirements increase with ambulation. Maintaining O2 saturation above 90%.  Chronic combined systolic and diastolic CHF (congestive heart failure) (HCC) Stable.  Compensated.  Patient is at baseline. Continue strict I/O. Continue Daily weights.    Malignant neoplasm of upper lobe of right lung (HCC) Pt being followed by oncology Dr. 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 -Recurrence-SBRT completed 06/11/2020.NGS showed TMB >10 muts/mb, MS stable, KRAS G12V, MYC  amplification,STK11 S219fs*47, FUBP1 splice sit 473+1G" Outpatient follow-up with oncology   Essential hypertension Blood pressure well-controlled   Diabetes (HCC) SSI for now.   PAD (peripheral artery disease) (HCC) Pt has stents for her PAD and f/u with dr. Lorretta Harp.  Continue Plavix    DVT prophylaxis: Lovenox enoxaparin (LOVENOX) injection 40 mg Start: 04/17/23 2200  Code Status: Full code Family Communication: . NO "discussed with patient" Disposition Plan: (Possible discharge in 2 to 4 days depending on clinical condition chest tube management   Consultants:  General surgery Interventional radiology Medical oncology 9/1  Procedures:  Chest tube placement by IR   Objective: Vitals:   04/19/23 0807 04/19/23 1206 04/19/23 1320 04/19/23 1626  BP: 116/75 132/68  139/70  Pulse: 78 70  79  Resp: 18 18  16   Temp: 98.2 F (36.8 C) 98.7 F (37.1 C)  98.1 F (36.7 C)  TempSrc:    Oral  SpO2: 100% 100% 98% 100%  Weight:       No intake or output data in the 24 hours ending 04/19/23 1658 Filed Weights   04/18/23 0500 04/19/23 0419  Weight: 57.8 kg 57 kg    Examination:  General exam: Appears calm and comfortable.  Alert and oriented x 3. Respiratory system: Mild rales at bases.  No wheezing noted.  Poor inspiratory effort.  Decreased breath sounds at the bases on the right Cardiovascular system: S1 & S2 heard, RRR. No JVD, murmurs, rubs, gallops or clicks. No pedal edema. Gastrointestinal system: Abdomen is nondistended, soft and nontender. No organomegaly or masses felt. Normal bowel sounds heard. Central nervous system: Alert and oriented. No focal  neurological deficits. Extremities: Symmetric 5 x 5 power.  No peripheral edema. Skin: No rashes, lesions or ulcers Psychiatry: Mood is appropriate for condition and setting    Data Reviewed: I have personally reviewed following labs and imaging studies  CBC: Recent Labs  Lab 04/16/23 1518 04/17/23 0558  04/18/23 0607 04/19/23 0611  WBC 7.9 5.7 7.0 6.3  NEUTROABS 6.7  --   --   --   HGB 8.8* 7.5* 8.1* 7.4*  HCT 31.0* 26.2* 27.9* 24.3*  MCV 101.6* 101.6* 99.6 96.8  PLT 269 250 256 229   Basic Metabolic Panel: Recent Labs  Lab 04/16/23 1518 04/17/23 0558 04/18/23 0607  NA 141 139 137  K 4.2 4.5 4.1  CL 104 102 100  CO2 28 32 32  GLUCOSE 111* 200* 176*  BUN 26* 34* 24*  CREATININE 0.78 0.67 0.61  CALCIUM 8.9 8.3* 8.7*   GFR: Estimated Creatinine Clearance: 52.5 mL/min (by C-G formula based on SCr of 0.61 mg/dL). Liver Function Tests: Recent Labs  Lab 04/16/23 1518 04/17/23 0558  AST 23 28  ALT 20 17  ALKPHOS 59 45  BILITOT 0.4 0.3  PROT 7.2 6.3*  ALBUMIN 3.9 3.4*    CBG: Recent Labs  Lab 04/18/23 1618 04/18/23 2052 04/19/23 0806 04/19/23 1203 04/19/23 1627  GLUCAP 136* 250* 163* 235* 171*     Radiology Studies: DG Chest Port 1 View  Result Date: 04/19/2023 CLINICAL DATA:  67 year old female with history of right-sided pneumothorax. EXAM: PORTABLE CHEST 1 VIEW COMPARISON:  Chest x-ray 04/18/2023. FINDINGS: Small bore right-sided chest tube with pigtail reformed in the lateral aspect of the upper right hemithorax. Trace residual right apical pneumothorax noted. Patchy multifocal irregular opacities in the right mid to lower lung, similar to prior studies, corresponding with areas of postradiation fibrosis from prior chest CT. Left lung is clear. No pleural effusions. No evidence of pulmonary edema. Heart size is normal. Upper mediastinal contours are within normal limits. IMPRESSION: 1. Stable position of right-sided chest tube with trace residual right apical pneumothorax. Radiographic appearance the chest is otherwise unchanged. Electronically Signed   By: Trudie Reed M.D.   On: 04/19/2023 08:52   DG Chest Port 1 View  Result Date: 04/18/2023 CLINICAL DATA:  Follow up pneumothorax. EXAM: PORTABLE CHEST 1 VIEW COMPARISON:  Radiographs 04/17/2023 and  04/16/2023.  CT 04/16/2023. FINDINGS: 0543 hours. Interval placement of a small caliber pigtail pleural catheter on the right, projecting over the right lateral lung apex. Minimal residual right apical pneumothorax. There is stable scarring at the right lung base. The lungs are otherwise clear. No pleural effusion. The heart size and mediastinal contours are stable with a leadless intracardiac pacemaker. Soft tissue emphysema noted about the chest tube insertion in the right lateral chest wall. No acute osseous findings. IMPRESSION: 1. Minimal residual right apical pneumothorax following chest tube placement. No other significant changes. 2. Stable right basilar scarring. Electronically Signed   By: Carey Bullocks M.D.   On: 04/18/2023 09:03        Scheduled Meds:  albuterol  2.5 mg Inhalation TID   clopidogrel  75 mg Oral Daily   enoxaparin (LOVENOX) injection  40 mg Subcutaneous Q24H   escitalopram  10 mg Oral Daily   insulin aspart  0-15 Units Subcutaneous TID WC   metoprolol succinate  25 mg Oral Daily   mometasone-formoterol  2 puff Inhalation BID   sodium chloride flush  3 mL Intravenous Q12H   tiotropium  18 mcg Inhalation Daily  Continuous Infusions:   LOS: 2 days    Time spent: 5 minutes    Darlin Drop, MD [facility] Pager [phone]  If 7PM-7AM, please contact night-coverage [email or web address]  04/19/2023, 4:58 PM

## 2023-04-19 NOTE — Progress Notes (Signed)
04/19/2023  Subjective: No acute events overnight.  Patient had a percutaneous chest tube placed by interventional radiology 2 days ago.  Patient reports she feels she can breathe better but does also reports soreness in the right chest from the chest tube itself.  Chest x-ray done this morning personally reviewed which shows only a trace residual apical pneumothorax o/w w/o change with stable chest tube location.  Vital signs: Temp:  [97.6 F (36.4 C)-98.2 F (36.8 C)] 98.2 F (36.8 C) (09/02 0807) Pulse Rate:  [73-85] 78 (09/02 0807) Resp:  [16-19] 18 (09/02 0807) BP: (105-127)/(66-86) 116/75 (09/02 0807) SpO2:  [98 %-100 %] 100 % (09/02 0807) Weight:  [57 kg] 57 kg (09/02 0419)   Intake/Output: 09/01 0701 - 09/02 0700 In: -  Out: 100 [Chest Tube:100] Last BM Date : 04/18/23  Physical Exam: Constitutional: No acute distress Pulm: Right chest tube in place to suction with no airleak on coughing.  Ensured the tubing was not twisted or kinked.  Dressing intact.  Labs:  Recent Labs    04/18/23 0607 04/19/23 0611  WBC 7.0 6.3  HGB 8.1* 7.4*  HCT 27.9* 24.3*  PLT 256 229   Recent Labs    04/17/23 0558 04/18/23 0607  NA 139 137  K 4.5 4.1  CL 102 100  CO2 32 32  GLUCOSE 200* 176*  BUN 34* 24*  CREATININE 0.67 0.61  CALCIUM 8.3* 8.7*   No results for input(s): "LABPROT", "INR" in the last 72 hours. Venous lactate 0.7  Imaging: DG Chest Port 1 View  Result Date: 04/19/2023 CLINICAL DATA:  67 year old female with history of right-sided pneumothorax. EXAM: PORTABLE CHEST 1 VIEW COMPARISON:  Chest x-ray 04/18/2023. FINDINGS: Small bore right-sided chest tube with pigtail reformed in the lateral aspect of the upper right hemithorax. Trace residual right apical pneumothorax noted. Patchy multifocal irregular opacities in the right mid to lower lung, similar to prior studies, corresponding with areas of postradiation fibrosis from prior chest CT. Left lung is clear. No pleural  effusions. No evidence of pulmonary edema. Heart size is normal. Upper mediastinal contours are within normal limits. IMPRESSION: 1. Stable position of right-sided chest tube with trace residual right apical pneumothorax. Radiographic appearance the chest is otherwise unchanged. Electronically Signed   By: Trudie Reed M.D.   On: 04/19/2023 08:52     Assessment/Plan: This is a 67 y.o. female with a right sided pneumothorax.  - Discussed with the patient the findings on her x-ray today which show stable pneumothorax with only a residual trace apical PTX.  She no air leak so discussed with her that we will change her to water seal for the next 24 hours.  Will obtain another x-ray in the morning and reassess at that point if removal is right. - From ambulation standpoint, the patient may ambulate ideally with the the container if it is available.    I spent 20 minutes dedicated to the care of this patient on the date of this encounter to include pre-visit review of records, face-to-face time with the patient discussing diagnosis and management, and any post-visit coordination of care.  Campbell Lerner, M.D., [facility]  04/19/2023 ; 10:25 AM

## 2023-04-19 NOTE — Plan of Care (Signed)

## 2023-04-20 ENCOUNTER — Inpatient Hospital Stay: Payer: 59

## 2023-04-20 DIAGNOSIS — J939 Pneumothorax, unspecified: Secondary | ICD-10-CM | POA: Diagnosis not present

## 2023-04-20 DIAGNOSIS — R0602 Shortness of breath: Secondary | ICD-10-CM | POA: Diagnosis not present

## 2023-04-20 LAB — BASIC METABOLIC PANEL
Anion gap: 6 (ref 5–15)
BUN: 18 mg/dL (ref 8–23)
CO2: 31 mmol/L (ref 22–32)
Calcium: 8.3 mg/dL — ABNORMAL LOW (ref 8.9–10.3)
Chloride: 101 mmol/L (ref 98–111)
Creatinine, Ser: 0.55 mg/dL (ref 0.44–1.00)
GFR, Estimated: >60 mL/min (ref >60)
Glucose, Bld: 203 mg/dL — ABNORMAL HIGH (ref 70–99)
Potassium: 4 mmol/L (ref 3.5–5.1)
Sodium: 138 mmol/L (ref 135–145)

## 2023-04-20 LAB — CBC
HCT: 24.9 % — ABNORMAL LOW (ref 36.0–46.0)
Hemoglobin: 7.4 g/dL — ABNORMAL LOW (ref 12.0–15.0)
MCH: 29.2 pg (ref 26.0–34.0)
MCHC: 29.7 g/dL — ABNORMAL LOW (ref 30.0–36.0)
MCV: 98.4 fL (ref 80.0–100.0)
Platelets: 246 10*3/uL (ref 150–400)
RBC: 2.53 MIL/uL — ABNORMAL LOW (ref 3.87–5.11)
RDW: 14.6 % (ref 11.5–15.5)
WBC: 6.2 10*3/uL (ref 4.0–10.5)
nRBC: 0 % (ref 0.0–0.2)

## 2023-04-20 LAB — GLUCOSE, CAPILLARY
Glucose-Capillary: 191 mg/dL — ABNORMAL HIGH (ref 70–99)
Glucose-Capillary: 216 mg/dL — ABNORMAL HIGH (ref 70–99)
Glucose-Capillary: 71 mg/dL (ref 70–99)
Glucose-Capillary: 167 mg/dL — ABNORMAL HIGH (ref 70–99)

## 2023-04-20 LAB — PHOSPHORUS: Phosphorus: 4 mg/dL (ref 2.5–4.6)

## 2023-04-20 LAB — MAGNESIUM: Magnesium: 2.6 mg/dL — ABNORMAL HIGH (ref 1.7–2.4)

## 2023-04-20 NOTE — Progress Notes (Signed)
Occupational Therapy Treatment Patient Details Name: Angela Jensen MRN: [id number] DOB: 06/19/1956 Today's Date: 04/20/2023   History of present illness Angela Jensen is a 67 y.o. female with medical history significant for shortness of breath.  Patient and has history of lung cancer and is on home O2 at 3 L.  Patient for a month has been short of breath.  No reports of fever hemoptysis cough congestion diarrhea chest pain palpitations any alleviating or worsening factors.  Patient has been using her inhalers and nebulizer treatments without relief.  Patient also reports that her left leg has been cramping intermittently follows up with Angela Jensen Angela Jensen vascular surgery and has stents in her advised her to follow-up in clinic be related to circulation pulses in the left leg is 1+ right leg is 2+.  No reports of falls.   OT comments  Pt seen for OT tx. Chest tube removed prior to OT session, pt endorsing site a bit "sore" but not painful. Pt agreeable to session. Pt sat on BSC next to sink in room for sponge bath requiring PRN VC for PLB and rest breaks. SpO2 dropped to low 80's on 4L O2 seated on BSC for UB bathing, bumped to 5L O2 and improved with rest and PLB to low 90's. Dropped to 86% on 5L with exertion, improving more quickly to mid 90's with VC for PLB and rest. RN notified at end of session. Left on 5L. Pt/family educated in ECS as noted below to support ADL/mobility participation and minimize SOB and desaturation. Pt continues to benefit from skilled OT services to maximize return to PLOF.       If plan is discharge home, recommend the following:  A little help with walking and/or transfers;A little help with bathing/dressing/bathroom;Assistance with cooking/housework;Assist for transportation;Help with stairs or ramp for entrance   Equipment Recommendations  Other (comment) Lexicographer)    Recommendations for Other Services      Precautions / Restrictions Precautions Precautions:  Fall Precaution Comments: watch O2 Restrictions Weight Bearing Restrictions: No       Mobility Bed Mobility Overal bed mobility: Modified Independent                  Transfers Overall transfer level: Modified independent Equipment used: None Transfers: Bed to chair/wheelchair/BSC       Step pivot transfers: Supervision           Balance Overall balance assessment: Modified Independent                                         ADL either performed or assessed with clinical judgement   ADL Overall ADL's : Needs assistance/impaired     Grooming: Sitting;Set up;Supervision/safety;Wash/dry hands;Wash/dry face;Applying deodorant   Upper Body Bathing: Sitting;Modified independent   Lower Body Bathing: Sit to/from stand;Supervison/ safety   Upper Body Dressing : Sitting;Minimal assistance Upper Body Dressing Details (indicate cue type and reason): MIN A gown change     Toilet Transfer: Agricultural engineer Details (indicate cue type and reason): step pivot SBA                Extremity/Trunk Assessment              Vision       Perception     Praxis      Cognition Arousal: Alert Behavior During Therapy: WFL for tasks assessed/performed  Overall Cognitive Status: Within Functional Limits for tasks assessed                                          Exercises Other Exercises Other Exercises: Pt/family educated in energy conservation strategies including activity pacing, PLB, work simiplification, home/routines modifications, and AE/DME.    Shoulder Instructions       General Comments SpO2 dropped to low 80's on 4L O2 seated on BSC for UB bathing, bumped to 5L O2 and improved with rest and PLB to low 90's. Dropped to 86% on 5L with exertion, improving more quickly to mid 90's with VC for PLB and rest. RN notified at end of session. Left on 5L.    Pertinent Vitals/ Pain       Pain Assessment Pain  Assessment: No/denies pain  Home Living                                          Prior Functioning/Environment              Frequency  Min 1X/week        Progress Toward Goals  OT Goals(current goals can now be found in the care plan section)  Progress towards OT goals: Progressing toward goals  Acute Rehab OT Goals Patient Stated Goal: pain to improve; return to PLOF OT Goal Formulation: With patient/family Time For Goal Achievement: 05/02/23 Potential to Achieve Goals: Good  Plan      Co-evaluation                 AM-PAC OT "6 Clicks" Daily Activity     Outcome Measure   Help from another person eating meals?: None Help from another person taking care of personal grooming?: None Help from another person toileting, which includes using toliet, bedpan, or urinal?: A Little Help from another person bathing (including washing, rinsing, drying)?: A Little Help from another person to put on and taking off regular upper body clothing?: A Little Help from another person to put on and taking off regular lower body clothing?: A Little 6 Click Score: 20    End of Session Equipment Utilized During Treatment: Oxygen  OT Visit Diagnosis: Unsteadiness on feet (R26.81)   Activity Tolerance Patient tolerated treatment well   Patient Left Other (comment) (seated on BSC for BM, call bell in lap, family present, RN notified)   Nurse Communication Mobility status;Other (comment) (O2, on [facility])        Time: 1308-6578 OT Time Calculation (min): 18 min  Charges: OT General Charges $OT Visit: 1 Visit OT Treatments $Self Care/Home Management : 8-22 mins  Arman Filter., MPH, MS, OTR/L ascom [phone] 04/20/23, 4:11 PM

## 2023-04-20 NOTE — Care Management Important Message (Signed)
Important Message  Patient Details  Name: Angela Jensen MRN: [id number] Date of Birth: May 08, 1956   Medicare Important Message Given:  Yes     Johnell Comings 04/20/2023, 11:01 AM

## 2023-04-20 NOTE — TOC Progression Note (Signed)
Transition of Care [facility]) - Progression Note    Patient Details  Name: Angela Jensen MRN: [id number] Date of Birth: [birth year]  [facility] of [facility]) CM/SW Contact  Truddie Hidden, RN Phone Number: 04/20/2023, 12:42 PM  Clinical Narrative:    Patient accepted by [facility].         Expected Discharge Plan and Services                                               Social Determinants of Health (SDOH) Interventions SDOH Screenings   Food Insecurity: No Food Insecurity (11/19/2022)  Housing: Low Risk  (11/19/2022)  Transportation Needs: No Transportation Needs (11/19/2022)  Utilities: Not At Risk (11/19/2022)  Alcohol Screen: Low Risk  (12/11/2021)  Depression (PHQ2-9): Low Risk  (10/08/2022)  Tobacco Use: Medium Risk (04/16/2023)    Readmission Risk Interventions    06/13/2021    2:40 PM 05/05/2021   12:24 PM  Readmission Risk Prevention Plan  Transportation Screening Complete Complete  PCP or Specialist Appt within 5-7 Days  Complete  Home Care Screening  Complete  Medication Review (RN CM)  Complete  Social Work Consult for Recovery Care Planning/Counseling Complete   Palliative Care Screening Not Applicable   Medication Review Oceanographer) Complete

## 2023-04-20 NOTE — Progress Notes (Signed)
1      PROGRESS NOTE    Angela Jensen  WGN:[id number] DOB: 12-Jun-1956 DOA: 04/16/2023 PCP: Sallyanne Kuster, NP   Brief Narrative:   67 year old female with a known history of COPD on 4 L nasal cannula at baseline, right lung cancer, s/p radiation therapy, right pneumothorax in 2021 which required chest tube and hospital admission.  Admitted for worsening shortness of breath and acute hypoxia.    Work up revealed right-sided hydropneumothorax for which a chest tube was placed by IR on 04/17/2023.  General surgery following for chest tube management.  Dr. Janyth Contes, medical oncology, consulted to assist with the management on 04/18/23.  With minimal output from the chest tube and O2 requirement back to baseline, chest tube was removed on 04/20/2023 by general surgery.  04/20/2023: The patient was seen and examined at bedside.  She has no new complaints at this time.  Overnight had some mild pain at the site of the chest tube which resolved with analgesics   Assessment & Plan:   Principal Problem:   SOB (shortness of breath) Active Problems:   Hydropneumothorax   Chronic combined systolic and diastolic CHF (congestive heart failure) (HCC)   Malignant neoplasm of upper lobe of right lung (HCC)   Essential hypertension   Diabetes (HCC)   PAD (peripheral artery disease) (HCC)   Pneumothorax  Right hydro pneumothorax status post right chest tube placement by IR on 04/17/2023, Dr. Deanne Coffer. Chest tube was managed by general surgery.  Removed on 04/20/2023 due to minimal output.  Acute on chronic hypoxic respiratory failure, resolved She is back to her baseline oxygen requirement of 4 L. Home O2 evaluation ordered to assess O2 requirement with ambulation. Continue bronchodilators  Chronic combined systolic and diastolic CHF (congestive heart failure) (HCC) Stable.  Compensated.  Patient is at baseline. Continue strict I/O. Continue Daily weights.    Malignant neoplasm of upper lobe of right lung  (HCC) Pt being followed by oncology Dr. 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 -Recurrence-SBRT completed 06/11/2020.NGS showed TMB >10 muts/mb, MS stable, KRAS G12V, MYC amplification,STK11 S249fs*47, FUBP1 splice sit 473+1G" Outpatient follow-up with oncology   Essential hypertension Blood pressure well-controlled   Diabetes (HCC) SSI for now.   PAD (peripheral artery disease) (HCC) Pt has stents for her PAD and f/u with dr. Lorretta Harp.   Continue Plavix    DVT prophylaxis: Lovenox subcu daily enoxaparin (LOVENOX) injection 40 mg Start: 04/17/23 2200  Code Status: Full code Family Communication: .  No family members at bedside. Disposition Plan: Plan to discharge to home possibly on 04/21/2023 or when general surgery signs off.   Consultants:  General surgery Interventional radiology Medical oncology 9/1  Procedures:  Chest tube placement by IR   Objective: Vitals:   04/20/23 0758 04/20/23 0812 04/20/23 1243 04/20/23 1619  BP:  139/79 130/87 126/75  Pulse:  72 73 74  Resp:  16 16 16   Temp:  98 F (36.7 C) 98.1 F (36.7 C) 98.1 F (36.7 C)  TempSrc:  Oral Oral Oral  SpO2: 100% 100% 99% 100%  Weight:        Intake/Output Summary (Last 24 hours) at 04/20/2023 1714 Last data filed at 04/20/2023 1515 Gross per 24 hour  Intake 560 ml  Output 206 ml  Net 354 ml   Filed Weights   04/18/23 0500 04/19/23 0419 04/20/23 0600  Weight: 57.8 kg 57 kg 58.4 kg    Examination:  General exam: Frail appearing calm, alert  and oriented x 3. Respiratory system: Mild rales at bases.  No wheezing noted.  Poor inspiratory effort.  Decreased breath sounds at the bases on the right Cardiovascular system: Regular rate and rhythm no rubs or gallops.  Gastrointestinal system: Abdomen is nondistended, soft and nontender. No organomegaly or masses felt. Normal bowel sounds heard. Central nervous system: Alert and oriented. No focal neurological  deficits. Extremities: Symmetric 5 x 5 power.  No peripheral edema. Skin: No rashes, lesions or ulcers Psychiatry: Mood is appropriate for condition and setting    Data Reviewed: I have personally reviewed following labs and imaging studies  CBC: Recent Labs  Lab 04/16/23 1518 04/17/23 0558 04/18/23 0607 04/19/23 0611 04/20/23 0707  WBC 7.9 5.7 7.0 6.3 6.2  NEUTROABS 6.7  --   --   --   --   HGB 8.8* 7.5* 8.1* 7.4* 7.4*  HCT 31.0* 26.2* 27.9* 24.3* 24.9*  MCV 101.6* 101.6* 99.6 96.8 98.4  PLT 269 250 256 229 246   Basic Metabolic Panel: Recent Labs  Lab 04/16/23 1518 04/17/23 0558 04/18/23 0607 04/20/23 0707  NA 141 139 137 138  K 4.2 4.5 4.1 4.0  CL 104 102 100 101  CO2 28 32 32 31  GLUCOSE 111* 200* 176* 203*  BUN 26* 34* 24* 18  CREATININE 0.78 0.67 0.61 0.55  CALCIUM 8.9 8.3* 8.7* 8.3*  MG  --   --   --  2.6*  PHOS  --   --   --  4.0   GFR: Estimated Creatinine Clearance: 53.1 mL/min (by C-G formula based on SCr of 0.55 mg/dL). Liver Function Tests: Recent Labs  Lab 04/16/23 1518 04/17/23 0558  AST 23 28  ALT 20 17  ALKPHOS 59 45  BILITOT 0.4 0.3  PROT 7.2 6.3*  ALBUMIN 3.9 3.4*    CBG: Recent Labs  Lab 04/19/23 1627 04/19/23 2022 04/20/23 0813 04/20/23 1244 04/20/23 1620  GLUCAP 171* 228* 191* 216* 71     Radiology Studies: DG Chest 1 View  Result Date: 04/20/2023 CLINICAL DATA:  Follow-up pneumothorax EXAM: CHEST  1 VIEW COMPARISON:  Prior chest x-ray 04/19/2023 FINDINGS: Persistent and unchanged small apical pneumothorax with chest tube in place. Spiculated pleuroparenchymal scarring again noted in the right middle and right lower lobe. Cardiac and mediastinal contours remain unchanged. No acute osseous abnormality. IMPRESSION: 1. Stable small apical pneumothorax with well-positioned chest tube in place. 2. Otherwise, unchanged appearance of the lungs. Electronically Signed   By: Malachy Moan M.D.   On: 04/20/2023 12:06   DG Chest  Port 1 View  Result Date: 04/19/2023 CLINICAL DATA:  67 year old female with history of right-sided pneumothorax. EXAM: PORTABLE CHEST 1 VIEW COMPARISON:  Chest x-ray 04/18/2023. FINDINGS: Small bore right-sided chest tube with pigtail reformed in the lateral aspect of the upper right hemithorax. Trace residual right apical pneumothorax noted. Patchy multifocal irregular opacities in the right mid to lower lung, similar to prior studies, corresponding with areas of postradiation fibrosis from prior chest CT. Left lung is clear. No pleural effusions. No evidence of pulmonary edema. Heart size is normal. Upper mediastinal contours are within normal limits. IMPRESSION: 1. Stable position of right-sided chest tube with trace residual right apical pneumothorax. Radiographic appearance the chest is otherwise unchanged. Electronically Signed   By: Trudie Reed M.D.   On: 04/19/2023 08:52        Scheduled Meds:  albuterol  2.5 mg Inhalation TID   clopidogrel  75 mg Oral Daily  enoxaparin (LOVENOX) injection  40 mg Subcutaneous Q24H   escitalopram  10 mg Oral Daily   insulin aspart  0-15 Units Subcutaneous TID WC   metoprolol succinate  25 mg Oral Daily   mometasone-formoterol  2 puff Inhalation BID   sodium chloride flush  3 mL Intravenous Q12H   tiotropium  18 mcg Inhalation Daily   Continuous Infusions:   LOS: 3 days    Time spent: 5 minutes    Darlin Drop, MD [facility] Pager [phone]  If 7PM-7AM, please contact night-coverage [email or web address]  04/20/2023, 5:14 PM

## 2023-04-20 NOTE — Progress Notes (Signed)
[facility] SURGICAL PROGRESS NOTE (cpt [phone])  Hospital Day(s): 3.   Interval History: Patient seen and examined, no acute events or new complaints overnight. Patient reports she is doing well. She feels that her breathing is at baseline; on 4L [place] which is her baseline. She has some discomfort at CT site itself. No fever, chills. She remains without leukocytosis; 6.2K. Hgb to 7.4 which is stable. Renal function remains normal; sCr - 0.55; UO - unmeasured. Mild hyperkalemia to 2.6. No other electrolyte derangements. Chest tube output 10 ccs; serosanguinous. There is no air leak. CXR stable without marked PTX.   Review of Systems:  Constitutional: denies fever, chills  HEENT: denies cough or congestion  Respiratory: denies any shortness of breath  Cardiovascular: + CP (at CT site); denies palpitations  Gastrointestinal: denies abdominal pain, N/V Musculoskeletal: denies pain, decreased motor or sensation  Vital signs in last 24 hours: [min-max] current  Temp:  [97.9 F (36.6 C)-98.7 F (37.1 C)] 98 F (36.7 C) (09/03 0812) Pulse Rate:  [70-88] 72 (09/03 0812) Resp:  [16-20] 16 (09/03 0812) BP: (132-145)/(68-79) 139/79 (09/03 0812) SpO2:  [97 %-100 %] 100 % (09/03 0812) Weight:  [58.4 kg] 58.4 kg (09/03 0600)       Weight: 58.4 kg BMI (Calculated): 25.99   Intake/Output last 2 shifts:  09/02 0701 - 09/03 0700 In: 480 [P.O.:480] Out: 10 [Chest Tube:10]   Physical Exam:  Constitutional: alert, cooperative and no distress  HENT: normocephalic without obvious abnormality  Eyes: PERRL, EOM's grossly intact and symmetric  Respiratory: breathing non-labored at rest; on [place] at 4L Cardiovascular: regular rate and sinus rhythm  Chest: Chest tube to right lateral chest wall; site CDI; no air leak (REMOVED)   Labs:     Latest Ref Rng & Units 04/20/2023    7:07 AM 04/19/2023    6:11 AM 04/18/2023    6:07 AM  CBC  WBC 4.0 - 10.5 K/uL 6.2  6.3  7.0   Hemoglobin 12.0 - 15.0  g/dL 7.4  7.4  8.1   Hematocrit 36.0 - 46.0 % 24.9  24.3  27.9   Platelets 150 - 400 K/uL 246  229  256       Latest Ref Rng & Units 04/20/2023    7:07 AM 04/18/2023    6:07 AM 04/17/2023    5:58 AM  CMP  Glucose 70 - 99 mg/dL 604  540  981   BUN 8 - 23 mg/dL 18  24  34   Creatinine 0.44 - 1.00 mg/dL 1.91  4.78  2.95   Sodium 135 - 145 mmol/L 138  137  139   Potassium 3.5 - 5.1 mmol/L 4.0  4.1  4.5   Chloride 98 - 111 mmol/L 101  100  102   CO2 22 - 32 mmol/L 31  32  32   Calcium 8.9 - 10.3 mg/dL 8.3  8.7  8.3   Total Protein 6.5 - 8.1 g/dL   6.3   Total Bilirubin 0.3 - 1.2 mg/dL   0.3   Alkaline Phos 38 - 126 U/L   45   AST 15 - 41 U/L   28   ALT 0 - 44 U/L   17      Imaging studies:   CXR (04/20/2023) personally reviewed, stable positioning of CT, question small, stable vs improved, apical PTX on the right, and radiologist report reviewed: IMPRESSION: 1. Stable small apical pneumothorax with well-positioned chest tube in place. 2. Otherwise,  unchanged appearance of the lungs.   Assessment/Plan: (ICD-10's: J93.9) 67 y.o. female with radiographically improved right pneumothorax s/p chest tube placement on 08/31, complicated by history of right lung CA s/p radiation therapy, COPD.   - Breathing at baseline, small, stable apical PTX, and without air leak. As such, CT removed at bedside without incident. Occlusive dressing placed; please leave in place for at least 48 hours.   - Continue supplemental O2; She is at her baseline  - Pulomonary toilet; IS use  - Mobilize as tolerated  - Further management per primary service    - Discharge Planning: CT removed without incident. Okay for discharge from surgical perspective once medically cleared.    All of the above findings and recommendations were discussed with the patient, and the medical team, and all of patient's questions were answered to her expressed satisfaction.  -- Lynden Oxford, PA-C [facility] 04/20/2023, 11:36 AM M-F: 7am - 4pm

## 2023-04-21 ENCOUNTER — Inpatient Hospital Stay: Payer: 59

## 2023-04-21 ENCOUNTER — Encounter: Payer: Self-pay | Admitting: Hematology and Oncology

## 2023-04-21 DIAGNOSIS — R0602 Shortness of breath: Secondary | ICD-10-CM | POA: Diagnosis not present

## 2023-04-21 DIAGNOSIS — J939 Pneumothorax, unspecified: Secondary | ICD-10-CM | POA: Diagnosis not present

## 2023-04-21 LAB — CBC
HCT: 25 % — ABNORMAL LOW (ref 36.0–46.0)
Hemoglobin: 7.6 g/dL — ABNORMAL LOW (ref 12.0–15.0)
MCH: 29.1 pg (ref 26.0–34.0)
MCHC: 30.4 g/dL (ref 30.0–36.0)
MCV: 95.8 fL (ref 80.0–100.0)
Platelets: 248 10*3/uL (ref 150–400)
RBC: 2.61 MIL/uL — ABNORMAL LOW (ref 3.87–5.11)
RDW: 14.5 % (ref 11.5–15.5)
WBC: 6.3 10*3/uL (ref 4.0–10.5)
nRBC: 0 % (ref 0.0–0.2)

## 2023-04-21 LAB — GLUCOSE, CAPILLARY
Glucose-Capillary: 150 mg/dL — ABNORMAL HIGH (ref 70–99)
Glucose-Capillary: 158 mg/dL — ABNORMAL HIGH (ref 70–99)
Glucose-Capillary: 167 mg/dL — ABNORMAL HIGH (ref 70–99)
Glucose-Capillary: 278 mg/dL — ABNORMAL HIGH (ref 70–99)

## 2023-04-21 MED ORDER — ALBUTEROL SULFATE (2.5 MG/3ML) 0.083% IN NEBU
2.5000 mg | INHALATION_SOLUTION | Freq: Two times a day (BID) | RESPIRATORY_TRACT | Status: DC
  Administered 2023-04-22: 2.5 mg via RESPIRATORY_TRACT
  Filled 2023-04-21 (×2): qty 3

## 2023-04-21 NOTE — Progress Notes (Signed)
Occupational Therapy Treatment Patient Details Name: Angela Jensen MRN: [id number] DOB: 11-Nov-1955 Today's Date: 04/21/2023   History of present illness Angela Jensen is a 67 y.o. female with medical history significant for shortness of breath.  Patient and has history of lung cancer and is on home O2 at 4 L.  Patient for a month has been short of breath.  No reports of fever hemoptysis cough congestion diarrhea chest pain palpitations any alleviating or worsening factors.  Patient has been using her inhalers and nebulizer treatments without relief.  Patient also reports that her left leg has been cramping intermittently follows up with Dr. Gilda Crease Dr. Wyn Quaker vascular surgery and has stents in her advised her to follow-up in clinic be related to circulation pulses in the left leg is 1+ right leg is 2+.  No reports of falls.   OT comments  OT education provided on O2 use during ADLs; monitored during ADL today; education provided to pt and husband. See below for details. Pt denies further need for OT in acute care at this time. Will d/c.      If plan is discharge home, recommend the following:  A little help with walking and/or transfers;A little help with bathing/dressing/bathroom;Assistance with cooking/housework;Assist for transportation;Help with stairs or ramp for entrance   Equipment Recommendations  Other (comment) (reacher)    Recommendations for Other Services      Precautions / Restrictions Precautions Precautions: Fall Precaution Comments: watch O2, chest tube removed 9/3 Restrictions Weight Bearing Restrictions: No       Mobility Bed Mobility Overal bed mobility: Modified Independent                  Transfers Overall transfer level: Modified independent                 General transfer comment: BSC to bed MOD (I), no AD     Balance Overall balance assessment: Modified Independent                                         ADL either  performed or assessed with clinical judgement   ADL Overall ADL's : Modified independent                                       General ADL Comments: Pt performing seated LB bathing during session; pt was already participating when OT showed up. OT checked O2 during activity (on 4L), 83%; pt required 2 minute seated rest break without activity to return O2 to 92%. OT provided education on turning up O2 to 5L during activity. OT turned O2 to 5L while pt finished up ADL; remained at 90%. T/f back to bed and O2 at 97%; OT turned back down to 4L and pt maintained. Education provided to pt and husband at bedside.    Extremity/Trunk Assessment Upper Extremity Assessment Upper Extremity Assessment: Overall WFL for tasks assessed   Lower Extremity Assessment Lower Extremity Assessment: Overall WFL for tasks assessed        Vision       Perception     Praxis      Cognition Arousal: Alert Behavior During Therapy: WFL for tasks assessed/performed Overall Cognitive Status: Within Functional Limits for tasks assessed  Exercises      Shoulder Instructions       General Comments Education provided to pt and husband on O2 use.    Pertinent Vitals/ Pain       Pain Assessment Pain Assessment: No/denies pain  Home Living                                          Prior Functioning/Environment              Frequency  Min 1X/week        Progress Toward Goals  OT Goals(current goals can now be found in the care plan section)  Progress towards OT goals: Goals met/education completed, patient discharged from OT (Pt stating she does not need further OT at this time)  Acute Rehab OT Goals Patient Stated Goal: Go home OT Goal Formulation: All assessment and education complete, DC therapy  Plan      Co-evaluation                 AM-PAC OT "6 Clicks" Daily Activity     Outcome  Measure   Help from another person eating meals?: None Help from another person taking care of personal grooming?: None Help from another person toileting, which includes using toliet, bedpan, or urinal?: None Help from another person bathing (including washing, rinsing, drying)?: None Help from another person to put on and taking off regular upper body clothing?: None Help from another person to put on and taking off regular lower body clothing?: None 6 Click Score: 24    End of Session Equipment Utilized During Treatment: Oxygen  OT Visit Diagnosis: Unsteadiness on feet (R26.81)   Activity Tolerance Patient tolerated treatment well   Patient Left in bed;with call bell/phone within reach   Nurse Communication Mobility status        Time: 1610-9604 OT Time Calculation (min): 8 min  Charges: OT General Charges $OT Visit: 1 Visit OT Treatments $Self Care/Home Management : 8-22 mins  Linward Foster, MS, OTR/L  Alvester Morin 04/21/2023, 4:14 PM

## 2023-04-21 NOTE — Progress Notes (Addendum)
Physical Therapy Treatment Patient Details Name: Angela Jensen MRN: [id number] DOB: 1956/02/02 Today's Date: 04/21/2023   History of Present Illness Angela Jensen is a 67 y.o. female with medical history significant for shortness of breath.  Patient and has history of lung cancer and is on home O2 at 4 L.  Patient for a month has been short of breath.  No reports of fever hemoptysis cough congestion diarrhea chest pain palpitations any alleviating or worsening factors.  Patient has been using her inhalers and nebulizer treatments without relief.  Patient also reports that her left leg has been cramping intermittently follows up with Dr. Gilda Crease Dr. Wyn Quaker vascular surgery and has stents in her advised her to follow-up in clinic be related to circulation pulses in the left leg is 1+ right leg is 2+.  No reports of falls.    PT Comments  Pt presents laying in bed, no complaints of pain. She continues to be modI for bed mobility and sit<>stand transfers with RW. Pt tolerated ambulating ~491ft with RW and CGA/supervision. Pt attempted ambulating with no AD ~35ft but voiced she felt more unsteady and will be using RW at home. She feels she is functioning close to baseline, but still feels some residual weakness and fatigue with mobility. Pt would benefit from continued skilled PT to maximize functional abilities and address deficits in activity tolerance.    SPO2 monitoring during session was as follows: SPO2 95% upon entry, dropped to 85% with initial ambulation, PT increased O2 flow to 5L and O2 able to remain >90%, dropped to 84% a second time before sitting in recliner but was able to quickly recover in sitting with encouragement of deep breathing. Pt returned to 98% at end of session and left on 4L.   If plan is discharge home, recommend the following: A little help with walking and/or transfers;A little help with bathing/dressing/bathroom;Assist for transportation;Help with stairs or ramp for  entrance;Assistance with cooking/housework   Can travel by private vehicle        Equipment Recommendations  None recommended by PT (has RW / cane at home)    Recommendations for Other Services       Precautions / Restrictions Precautions Precautions: Fall Precaution Comments: watch O2, chest tube removed 9/3 Restrictions Weight Bearing Restrictions: No     Mobility  Bed Mobility Overal bed mobility: Modified Independent                  Transfers Overall transfer level: Modified independent Equipment used: Rolling walker (2 wheels) Transfers: Sit to/from Stand Sit to Stand: Modified independent (Device/Increase time)                Ambulation/Gait Ambulation/Gait assistance: Supervision, Contact guard assist Gait Distance (Feet): 450 Feet Assistive device: Rolling walker (2 wheels), None         General Gait Details: Pt attempted ambulating with no RW (~68ft), but felt unsteady and plans to use RW at home.   Stairs             Wheelchair Mobility     Tilt Bed    Modified Rankin (Stroke Patients Only)       Balance Overall balance assessment: Modified Independent                                          Cognition Arousal: Alert Behavior During Therapy: [facility]  for tasks assessed/performed Overall Cognitive Status: Within Functional Limits for tasks assessed                                          Exercises      General Comments General comments (skin integrity, edema, etc.): SPO2 95% upon entry, dropped to 85% with initial ambulation, PT increased O2 flow to 5L and O2 able to remain >90%, dropped to 84% a second time before sitting in recliner but was able to quickly recover in sitting with encouragement of deep breathing. Pt returned to 98% at end of session and left on 4L      Pertinent Vitals/Pain Pain Assessment Pain Assessment: No/denies pain    Home Living                           Prior Function            PT Goals (current goals can now be found in the care plan section) Acute Rehab PT Goals Patient Stated Goal: to improve, go home Progress towards PT goals: Progressing toward goals    Frequency    Min 1X/week      PT Plan      Co-evaluation              AM-PAC PT "6 Clicks" Mobility   Outcome Measure  Help needed turning from your back to your side while in a flat bed without using bedrails?: None Help needed moving from lying on your back to sitting on the side of a flat bed without using bedrails?: None Help needed moving to and from a bed to a chair (including a wheelchair)?: None Help needed standing up from a chair using your arms (e.g., wheelchair or bedside chair)?: None Help needed to walk in hospital room?: A Little Help needed climbing 3-5 steps with a railing? : A Little 6 Click Score: 22    End of Session Equipment Utilized During Treatment: Oxygen Activity Tolerance: Patient tolerated treatment well Patient left: in chair;with call bell/phone within reach   PT Visit Diagnosis: Muscle weakness (generalized) (M62.81)     Time: 0940-1001 PT Time Calculation (min) (ACUTE ONLY): 21 min  Charges:    $Therapeutic Activity: 8-22 mins PT General Charges $$ ACUTE PT VISIT: 1 Visit                     Dara Camargo, PT, SPT 11:46 AM,04/21/23

## 2023-04-21 NOTE — Progress Notes (Addendum)
1      PROGRESS NOTE    Angela Jensen  RUE:[id number] DOB: [birth year] DOA: 04/16/2023 PCP: Sallyanne Kuster, NP   Brief Narrative:   67 year old female with a known history of COPD on 4 L nasal cannula at baseline, right lung cancer, s/p radiation therapy, right pneumothorax in 2021 which required chest tube and hospital admission.  Admitted for worsening shortness of breath and acute hypoxia.    Work up revealed right-sided hydropneumothorax for which a chest tube was placed by IR on 04/17/2023.  General surgery following for chest tube management.  Dr. Janyth Contes, medical oncology, consulted to assist with the management on 04/18/23.  With minimal output from the chest tube and O2 requirement back to baseline, chest tube was removed on 04/20/2023 by general surgery.  Further details of hospital course and management as outlined below.   04/21/2023: The patient was seen and examined at bedside.  She reports dyspnea on exertion remains worse than baseline.  At rest breathing is okay.  She feels she might be ready tomorrow if still improving.  No other current complaints.    Assessment & Plan:   Principal Problem:   SOB (shortness of breath) Active Problems:   Hydropneumothorax   Chronic combined systolic and diastolic CHF (congestive heart failure) (HCC)   Malignant neoplasm of upper lobe of right lung (HCC)   Essential hypertension   Diabetes (HCC)   PAD (peripheral artery disease) (HCC)   Pneumothorax  Right hydro pneumothorax status post right chest tube placement by IR on 04/17/2023, Dr. Deanne Coffer. Chest tube was managed by general surgery.   Removed on 04/20/2023 due to minimal output. CXR this AM 9/4 - no definitive evidence of residual or recurrent pneumothorax  Acute on chronic hypoxic respiratory failure, resolved She is back to her baseline oxygen requirement of 4 L/min. Home O2 evaluation ordered to assess O2 requirement with ambulation. Continue bronchodilators  Chronic combined  systolic and diastolic CHF (congestive heart failure) (HCC) Stable.  Compensated.  Patient is at baseline. Continue strict I/O. Continue Daily weights.    Malignant neoplasm of upper lobe of right lung (HCC) Pt being followed by oncology Dr. 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 -Recurrence-SBRT completed 06/11/2020.NGS showed TMB >10 muts/mb, MS stable, KRAS G12V, MYC amplification,STK11 S273fs*47, FUBP1 splice sit 473+1G" Outpatient follow-up with oncology   Essential hypertension Blood pressure well-controlled   Type 2 Diabetes with hyperglycemia SSI for now. Titrate regimen for CBG goal 140-180   PAD (peripheral artery disease) (HCC) Pt has stents for her PAD and f/u with dr. Lorretta Harp.   Continue Plavix    DVT prophylaxis: Lovenox subcu daily enoxaparin (LOVENOX) injection 40 mg Start: 04/17/23 2200  Code Status: Full code Family Communication: .  No family members at bedside. Disposition Plan: Plan to discharge to home possibly on 04/22/2023 if O2 needs remain stable for 24 more hours.   Consultants:  General surgery Interventional radiology Medical oncology 9/1  Procedures:  Chest tube placement by IR Chest tube removed by general surgery 9/3   Objective: Vitals:   04/21/23 0829 04/21/23 0859 04/21/23 1158 04/21/23 1553  BP: 110/60  139/71 135/72  Pulse: 81  70 83  Resp: 16  20 20   Temp: 97.8 F (36.6 C)  97.9 F (36.6 C) 98.3 F (36.8 C)  TempSrc: Oral  Oral Oral  SpO2: 93% 98% 99% 100%  Weight:        Intake/Output Summary (Last 24 hours) at 04/21/2023 1604 Last  data filed at 04/21/2023 0915 Gross per 24 hour  Intake 360 ml  Output 0 ml  Net 360 ml   Filed Weights   04/18/23 0500 04/19/23 0419 04/20/23 0600  Weight: 57.8 kg 57 kg 58.4 kg    Examination:  General exam: awake, alert, no acute distress, chronically ill-appearing HEENT: moist mucus membranes, hearing grossly normal  Respiratory system: CTAB  generally diminished, no wheezes, rales or rhonchi, normal respiratory effort, on 4 L/min [place] o2. Cardiovascular system: normal S1/S2, RRR, no JVD, murmurs, rubs, gallops, no pedal edema.   Gastrointestinal system: soft, NT, ND, no HSM felt, +bowel sounds. Central nervous system: A&O x 3. no gross focal neurologic deficits, normal speech Extremities: moves all, no edema, normal tone Skin: dry, intact, normal temperature Psychiatry: normal mood, congruent affect, judgement and insight appear normal     Data Reviewed: I have personally reviewed following labs and imaging studies  CBC: Recent Labs  Lab 04/16/23 1518 04/17/23 0558 04/18/23 0607 04/19/23 0611 04/20/23 0707 04/21/23 0536  WBC 7.9 5.7 7.0 6.3 6.2 6.3  NEUTROABS 6.7  --   --   --   --   --   HGB 8.8* 7.5* 8.1* 7.4* 7.4* 7.6*  HCT 31.0* 26.2* 27.9* 24.3* 24.9* 25.0*  MCV 101.6* 101.6* 99.6 96.8 98.4 95.8  PLT 269 250 256 229 246 248   Basic Metabolic Panel: Recent Labs  Lab 04/16/23 1518 04/17/23 0558 04/18/23 0607 04/20/23 0707  NA 141 139 137 138  K 4.2 4.5 4.1 4.0  CL 104 102 100 101  CO2 28 32 32 31  GLUCOSE 111* 200* 176* 203*  BUN 26* 34* 24* 18  CREATININE 0.78 0.67 0.61 0.55  CALCIUM 8.9 8.3* 8.7* 8.3*  MG  --   --   --  2.6*  PHOS  --   --   --  4.0   GFR: Estimated Creatinine Clearance: 53.1 mL/min (by C-G formula based on SCr of 0.55 mg/dL). Liver Function Tests: Recent Labs  Lab 04/16/23 1518 04/17/23 0558  AST 23 28  ALT 20 17  ALKPHOS 59 45  BILITOT 0.4 0.3  PROT 7.2 6.3*  ALBUMIN 3.9 3.4*    CBG: Recent Labs  Lab 04/20/23 1244 04/20/23 1620 04/20/23 2114 04/21/23 0759 04/21/23 1158  GLUCAP 216* 71 167* 150* 158*     Radiology Studies: DG Chest 2 View  Result Date: 04/21/2023 CLINICAL DATA:  Pneumothorax.  Post chest tube removal. EXAM: CHEST - 2 VIEW COMPARISON:  04/20/2023; 04/19/2023; 04/18/2023 FINDINGS: Grossly unchanged cardiac silhouette and mediastinal contours.  Loop recorder device again overlies the midline of the chest. Interval removal of right-sided chest tube without definitive evidence of a residual or recurrent right-sided pneumothorax. Right mid and lower lung heterogeneous opacities are unchanged. Unchanged trace right-sided pleural effusion and right lateral chest wall subcutaneous emphysema. The left lung remains well aerated. No evidence of edema. No acute osseous abnormalities. IMPRESSION: 1. Interval removal of right-sided chest tube without definitive evidence of a residual or recurrent right-sided pneumothorax. 2. Unchanged right mid and lower lung heterogeneous opacities, atelectasis versus infiltrate. 3. Unchanged trace right-sided pleural effusion and right lateral chest wall subcutaneous emphysema. Electronically Signed   By: Simonne Come M.D.   On: 04/21/2023 08:42   DG Chest 1 View  Result Date: 04/20/2023 CLINICAL DATA:  Follow-up pneumothorax EXAM: CHEST  1 VIEW COMPARISON:  Prior chest x-ray 04/19/2023 FINDINGS: Persistent and unchanged small apical pneumothorax with chest tube in place. Spiculated pleuroparenchymal scarring  again noted in the right middle and right lower lobe. Cardiac and mediastinal contours remain unchanged. No acute osseous abnormality. IMPRESSION: 1. Stable small apical pneumothorax with well-positioned chest tube in place. 2. Otherwise, unchanged appearance of the lungs. Electronically Signed   By: Malachy Moan M.D.   On: 04/20/2023 12:06        Scheduled Meds:  albuterol  2.5 mg Inhalation BID   clopidogrel  75 mg Oral Daily   enoxaparin (LOVENOX) injection  40 mg Subcutaneous Q24H   escitalopram  10 mg Oral Daily   insulin aspart  0-15 Units Subcutaneous TID WC   metoprolol succinate  25 mg Oral Daily   mometasone-formoterol  2 puff Inhalation BID   sodium chloride flush  3 mL Intravenous Q12H   tiotropium  18 mcg Inhalation Daily   Continuous Infusions:   LOS: 4 days    Time spent: 5  minutes    Pennie Banter, DO [facility]  If 7PM-7AM, please contact night-coverage [email or web address]  04/21/2023, 4:04 PM

## 2023-04-21 NOTE — Hospital Course (Signed)
"  67 year old female with a known history of COPD on 4 L nasal cannula at baseline, right lung cancer, s/p radiation therapy, right pneumothorax in 2021 which required chest tube and hospital admission.  Admitted for worsening shortness of breath and acute hypoxia.     Work up revealed right-sided hydropneumothorax for which a chest tube was placed by IR on 04/17/2023.  General surgery following for chest tube management.  Dr. Janyth Contes, medical oncology, consulted to assist with the management on 04/18/23.  With minimal output from the chest tube and O2 requirement back to baseline, chest tube was removed on 04/20/2023 by general surgery."

## 2023-04-21 NOTE — Plan of Care (Signed)
  Problem: Education: Goal: Ability to describe self-care measures that may prevent or decrease complications (Diabetes Survival Skills Education) will improve Outcome: Progressing   Problem: Coping: Goal: Ability to adjust to condition or change in health will improve Outcome: Progressing   Problem: Fluid Volume: Goal: Ability to maintain a balanced intake and output will improve Outcome: Progressing   Problem: Health Behavior/Discharge Planning: Goal: Ability to identify and utilize available resources and services will improve Outcome: Progressing Goal: Ability to manage health-related needs will improve Outcome: Progressing   Problem: Metabolic: Goal: Ability to maintain appropriate glucose levels will improve Outcome: Progressing   Problem: Nutritional: Goal: Maintenance of adequate nutrition will improve Outcome: Progressing Goal: Progress toward achieving an optimal weight will improve Outcome: Progressing   Problem: Skin Integrity: Goal: Risk for impaired skin integrity will decrease Outcome: Progressing   Problem: Tissue Perfusion: Goal: Adequacy of tissue perfusion will improve Outcome: Progressing   Problem: Education: Goal: Knowledge of General Education information will improve Description: Including pain rating scale, medication(s)/side effects and non-pharmacologic comfort measures Outcome: Progressing

## 2023-04-21 NOTE — Progress Notes (Signed)
[facility] SURGICAL PROGRESS NOTE (cpt [phone])  Hospital Day(s): 4.   Interval History: Patient seen and examined, no acute events or new complaints overnight. Patient reports she is doing well. She feels that her breathing remains at her baseline; on 4L [place]. No fever, chills. She remains without leukocytosis; 6.3K. Hgb to 7.6 which is stable. Chest tube removed yesterday (09/03) without issues. CXR this AM remains stable  Review of Systems:  Constitutional: denies fever, chills  HEENT: denies cough or congestion  Respiratory: denies any shortness of breath  Cardiovascular: denied CP; denies palpitations  Gastrointestinal: denies abdominal pain, N/V Musculoskeletal: denies pain, decreased motor or sensation  Vital signs in last 24 hours: [min-max] current  Temp:  [98.1 F (36.7 C)-98.7 F (37.1 C)] 98.6 F (37 C) (09/04 0350) Pulse Rate:  [73-81] 75 (09/04 0350) Resp:  [16-18] 18 (09/04 0350) BP: (126-156)/(63-87) 156/75 (09/04 0350) SpO2:  [98 %-100 %] 100 % (09/04 0350)       Weight: 58.4 kg BMI (Calculated): 25.99   Intake/Output last 2 shifts:  09/03 0701 - 09/04 0700 In: 200 [P.O.:200] Out: 196 [Urine:195; Stool:1]   Physical Exam:  Constitutional: alert, cooperative and no distress  HENT: normocephalic without obvious abnormality  Eyes: PERRL, EOM's grossly intact and symmetric  Respiratory: breathing non-labored at rest; on [place] at 4L Cardiovascular: regular rate and sinus rhythm  Chest: Dressing to right lateral chest CDI   Labs:     Latest Ref Rng & Units 04/21/2023    5:36 AM 04/20/2023    7:07 AM 04/19/2023    6:11 AM  CBC  WBC 4.0 - 10.5 K/uL 6.3  6.2  6.3   Hemoglobin 12.0 - 15.0 g/dL 7.6  7.4  7.4   Hematocrit 36.0 - 46.0 % 25.0  24.9  24.3   Platelets 150 - 400 K/uL 248  246  229       Latest Ref Rng & Units 04/20/2023    7:07 AM 04/18/2023    6:07 AM 04/17/2023    5:58 AM  CMP  Glucose 70 - 99 mg/dL 914  782  956   BUN 8 - 23 mg/dL 18  24   34   Creatinine 0.44 - 1.00 mg/dL 2.13  0.86  5.78   Sodium 135 - 145 mmol/L 138  137  139   Potassium 3.5 - 5.1 mmol/L 4.0  4.1  4.5   Chloride 98 - 111 mmol/L 101  100  102   CO2 22 - 32 mmol/L 31  32  32   Calcium 8.9 - 10.3 mg/dL 8.3  8.7  8.3   Total Protein 6.5 - 8.1 g/dL   6.3   Total Bilirubin 0.3 - 1.2 mg/dL   0.3   Alkaline Phos 38 - 126 U/L   45   AST 15 - 41 U/L   28   ALT 0 - 44 U/L   17      Imaging studies:   CXR (04/21/2023) personally reviewed, chest tube removed, no significant pneumothorax, and radiologist report pending:  Assessment/Plan: (ICD-10's: J93.9) 67 y.o. female with radiographically resolved right pneumothorax s/p chest tube placement on 08/31, complicated by history of right lung CA s/p radiation therapy, COPD.   - CXR this AM stable; no significant PTX s/p chest tube removal yesterday (09/03)  - Keep right lateral chest dressing another 24 hours   - Continue supplemental O2; She is at her baseline  - Pulomonary toilet; IS use  -  Mobilize as tolerated  - Further management per primary service    - Discharge Planning: Nothing further from surgical perspective; okay for discharge once medically clear.  All of the above findings and recommendations were discussed with the patient, and the medical team, and all of patient's questions were answered to her expressed satisfaction.  -- Lynden Oxford, PA-C [facility] 04/21/2023, 8:16 AM M-F: 7am - 4pm

## 2023-04-22 DIAGNOSIS — R0602 Shortness of breath: Secondary | ICD-10-CM | POA: Diagnosis not present

## 2023-04-22 LAB — CBC
HCT: 25.1 % — ABNORMAL LOW (ref 36.0–46.0)
Hemoglobin: 7.6 g/dL — ABNORMAL LOW (ref 12.0–15.0)
MCH: 29.5 pg (ref 26.0–34.0)
MCHC: 30.3 g/dL (ref 30.0–36.0)
MCV: 97.3 fL (ref 80.0–100.0)
Platelets: 261 10*3/uL (ref 150–400)
RBC: 2.58 MIL/uL — ABNORMAL LOW (ref 3.87–5.11)
RDW: 14.4 % (ref 11.5–15.5)
WBC: 5.6 10*3/uL (ref 4.0–10.5)
nRBC: 0 % (ref 0.0–0.2)

## 2023-04-22 LAB — GLUCOSE, CAPILLARY
Glucose-Capillary: 197 mg/dL — ABNORMAL HIGH (ref 70–99)
Glucose-Capillary: 89 mg/dL (ref 70–99)
Glucose-Capillary: 140 mg/dL — ABNORMAL HIGH (ref 70–99)

## 2023-04-22 MED ORDER — ALBUTEROL SULFATE HFA 108 (90 BASE) MCG/ACT IN AERS: INHALATION_SPRAY | RESPIRATORY_TRACT | 1 refills | Status: DC

## 2023-04-22 MED ORDER — HYDROCODONE-ACETAMINOPHEN 5-325 MG PO TABS: 1.0000 | ORAL_TABLET | ORAL | 0 refills | Status: DC | PRN

## 2023-04-22 NOTE — Consult Note (Signed)
   [facility]  [facility] Inpatient Consult   04/22/2023  ERINA GIORGIANNI 1955/09/29 [id number]  Location:  RN Hospital Liaison screened the patient remotely at [facility]. Coverage for Elliot Cousin RN HL  Insurance: [employer]  Primary Care Provider:  Sallyanne Kuster, NP with [facility]   The patient was screened for 5 day hospitalization with noted  medium risk score admission for SOB with hx of Lung Cancer noted  The patient was assessed for potential [facility]) Care Management service needs for post hospital transition for care coordination. Review of patient's electronic medical record reveals patient is for home with [facility] noted per inpatient [facility] team.   Plan: [facility] Liaison will continue to follow progress and disposition to asess for post hospital community care coordination/management needs.    Referral request for community care coordination: Was unable to reach patient by phone as patient had transitioned home. No additional needs assessed at this time.   [facility] Care Management/Population Health does not replace or interfere with any arrangements made by the Inpatient Transition of Care team.   For questions contact:   Charlesetta Shanks, RN, BSN, CCM [place]  [facility], [facility] Liaison Direct Dial: [phone] or secure chat Website: [email or web address]

## 2023-04-22 NOTE — Discharge Summary (Addendum)
Physician Discharge Summary   Patient: Angela Jensen MRN: [id number] DOB: 23-Jul-1956  Admit date:     04/16/2023  Discharge date: 04/22/2023  Discharge Physician: Pennie Banter   PCP: Sallyanne Kuster, NP   Recommendations at discharge:    Follow up with Primary Care in 1-2 weeks Repeat CBC, BMP at follow up Follow up as scheduled with Pulmonology   Discharge Diagnoses: Principal Problem:   SOB (shortness of breath) Active Problems:   Chronic combined systolic and diastolic CHF (congestive heart failure) (HCC)   Malignant neoplasm of upper lobe of right lung (HCC)   Essential hypertension   Diabetes (HCC)   PAD (peripheral artery disease) (HCC)   Pneumothorax  Resolved Problems:   Hydropneumothorax  Hospital Course:  67 year old female with a known history of COPD on 4 L nasal cannula at baseline, right lung cancer, s/p radiation therapy, right pneumothorax in 2021 which required chest tube and hospital admission.  Admitted for worsening shortness of breath and acute hypoxia.     Work up revealed right-sided hydropneumothorax for which a chest tube was placed by IR on 04/17/2023.  General surgery following for chest tube management.  Dr. Janyth Contes, medical oncology, consulted to assist with the management on 04/18/23.  With minimal output from the chest tube and O2 requirement back to baseline, chest tube was removed on 04/20/2023 by general surgery.   Further details of hospital course and management as outlined below.  04/22/23 -- pt doing well. Ambulating around the unit on her baseline 4 L/min oxygen. Dyspnea is improved and pt reports this is at baseline.  She is medically stable and agreeable to d/c home today.   Assessment and Plan:  Right hydro pneumothorax status post right chest tube placement by IR on 04/17/2023, Dr. Deanne Coffer. Chest tube was managed by general surgery.   Removed on 04/20/2023 due to minimal output. CXR this AM 9/4 - no definitive evidence of residual or  recurrent pneumothorax   Acute on chronic hypoxic respiratory failure, resolved She is back to her baseline oxygen requirement of 4 L/min, stable with ambulation. Continue home regimen of inhalers / bronchodilators   Chronic combined systolic and diastolic CHF (congestive heart failure) (HCC) Stable.  Compensated.  Continue Lasix. Continue Daily weights.    Malignant neoplasm of upper lobe of right lung (HCC) Pt being followed by oncology Dr. 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 -Recurrence-SBRT completed 06/11/2020.NGS showed TMB >10 muts/mb, MS stable, KRAS G12V, MYC amplification,STK11 S265fs*47, FUBP1 splice sit 473+1G" Outpatient follow-up with oncology   Essential hypertension Blood pressure well-controlled   Type 2 Diabetes with hyperglycemia SSI during admission. Resume home regimen at discharge   PAD (peripheral artery disease) (HCC) Pt has stents for her PAD and f/u with dr. Lorretta Harp.   Continue Plavix       Consultants: General surgery Procedures performed: Chest tube placement, removal  Disposition: Home Diet recommendation:  Cardiac and Carb modified diet DISCHARGE MEDICATION: Allergies as of 04/22/2023       Reactions   Trelegy Ellipta [fluticasone-umeclidin-vilant]    Had breathing issues        Medication List     STOP taking these medications    diclofenac 75 MG EC tablet Commonly known as: VOLTAREN   predniSONE 10 MG tablet Commonly known as: DELTASONE   traMADol 50 MG tablet Commonly known as: ULTRAM       TAKE these medications    Accu-Chek Guide test strip Generic drug: glucose blood  USE TO TEST BLOOD SUGARS 5 TIMES DAILY   Accu-Chek Guide w/Device Kit Use as directed Dx e11.65   Accu-Chek Softclix Lancets lancets Use 1 lancet 3 times daily to check glucose for diabetes   albuterol 108 (90 Base) MCG/ACT inhaler Commonly known as: VENTOLIN HFA INHALE 2 PUFFS BY MOUTH EVERY 6 HOURS AS  NEEDED FOR WHEEZING OR SHORTNESS OF BREATH   ALPRAZolam 0.25 MG tablet Commonly known as: XANAX TAKE 1 TABLET BY MOUTH THREE TIMES DAILY AS NEEDED FOR ANXIETY   calcium-vitamin D 500-5 MG-MCG tablet Commonly known as: OSCAL WITH D Take 1 tablet by mouth 2 (two) times daily.   clopidogrel 75 MG tablet Commonly known as: PLAVIX Take 1 tablet (75 mg total) by mouth daily.   cyclobenzaprine 10 MG tablet Commonly known as: FLEXERIL Take 1 tablet (10 mg total) by mouth at bedtime. Take one tab po qhs for back spasm prn only   Dexcom G7 Receiver Devi Use one as directed for uncontrolled dm   Dexcom G7 Sensor Misc Apply 1 sensor to skin on designated areas every 10 days to monitor glucose levels with sensor or smart phone app   escitalopram 10 MG tablet Commonly known as: LEXAPRO Take 1 tablet (10 mg total) by mouth daily.   famotidine 20 MG tablet Commonly known as: PEPCID Take 1 tablet (20 mg total) by mouth daily for 14 days. What changed:  when to take this reasons to take this   ferrous sulfate 325 (65 FE) MG tablet Take 325 mg by mouth 2 (two) times daily with a meal.   Fish Oil 1000 MG Caps Take 1 capsule by mouth daily.   fluticasone 50 MCG/ACT nasal spray Commonly known as: FLONASE Place 2 sprays into both nostrils daily. What changed:  when to take this reasons to take this   furosemide 20 MG tablet Commonly known as: LASIX Take 1 tablet (20 mg total) by mouth daily as needed.   gabapentin 300 MG capsule Commonly known as: NEURONTIN Take 1 capsule (300 mg total) by mouth 3 (three) times daily. What changed:  how much to take when to take this   HYDROcodone-acetaminophen 5-325 MG tablet Commonly known as: NORCO/VICODIN Take 1 tablet by mouth every 4 (four) hours as needed for moderate pain.   ipratropium-albuterol 0.5-2.5 (3) MG/3ML Soln Commonly known as: DUONEB Take 3 mLs by nebulization every 4 (four) hours as needed.   Levemir FlexTouch 100  UNIT/ML FlexTouch Pen Generic drug: insulin detemir Inject 15 Units into the skin daily with supper.   lisinopril-hydrochlorothiazide 20-25 MG tablet Commonly known as: ZESTORETIC Take 1 tablet by mouth daily.   metFORMIN 500 MG tablet Commonly known as: GLUCOPHAGE TAKE 2 TABLETS BY MOUTH ONCE DAILY WITH BREAKFAST   metoprolol succinate 25 MG 24 hr tablet Commonly known as: TOPROL-XL Take 1 tablet (25 mg total) by mouth daily.   mometasone-formoterol 200-5 MCG/ACT Aero Commonly known as: DULERA Inhale 2 puffs into the lungs 2 (two) times daily.   OXYGEN Inhale 4 L into the lungs. PT USES [facility] FOR OXYGEN   potassium chloride 10 MEQ tablet Commonly known as: KLOR-CON Take 1 tablet (10 mEq total) by mouth every other day.   ReliOn Pen Needles 31G X 6 MM Misc Generic drug: Insulin Pen Needle USE 1 PEN NEEDLE WITH INSULIN PEN ONCE DAILY FOR DIABETES   Spiriva HandiHaler 18 MCG inhalation capsule Generic drug: tiotropium INHALE THE CONTENTS OF 1 CAPSULES BY MOUTH ONCE DAILY - DO NOT  SWALLOW CAPSULES   VITAMIN D (CHOLECALCIFEROL) PO Take 1 tablet by mouth daily with supper.        Discharge Exam: Filed Weights   04/19/23 0419 04/20/23 0600 04/22/23 0756  Weight: 57 kg 58.4 kg 55.1 kg   General exam: awake, alert, no acute distress, frail appearing HEENT: atraumatic, clear conjunctiva, anicteric sclera, moist mucus membranes, hearing grossly normal  Respiratory system: CTAB but generally diminished, no wheezes, rales or rhonchi, normal respiratory effort at rest on 4 L/min. Cardiovascular system: normal S1/S2, RRR, no JVD, murmurs, rubs, gallops, no pedal edema.   Gastrointestinal system: soft, NT, ND Central nervous system: A&O x 4. no gross focal neurologic deficits, normal speech Extremities: moves all, no edema, normal tone Skin: dry, intact, normal temperature, normal color, No rashes, lesions or ulcers Psychiatry: normal mood, congruent affect, judgement  and insight appear normal   Condition at discharge: stable  The results of significant diagnostics from this hospitalization (including imaging, microbiology, ancillary and laboratory) are listed below for reference.   Imaging Studies: DG Chest 2 View  Result Date: 04/21/2023 CLINICAL DATA:  Pneumothorax.  Post chest tube removal. EXAM: CHEST - 2 VIEW COMPARISON:  04/20/2023; 04/19/2023; 04/18/2023 FINDINGS: Grossly unchanged cardiac silhouette and mediastinal contours. Loop recorder device again overlies the midline of the chest. Interval removal of right-sided chest tube without definitive evidence of a residual or recurrent right-sided pneumothorax. Right mid and lower lung heterogeneous opacities are unchanged. Unchanged trace right-sided pleural effusion and right lateral chest wall subcutaneous emphysema. The left lung remains well aerated. No evidence of edema. No acute osseous abnormalities. IMPRESSION: 1. Interval removal of right-sided chest tube without definitive evidence of a residual or recurrent right-sided pneumothorax. 2. Unchanged right mid and lower lung heterogeneous opacities, atelectasis versus infiltrate. 3. Unchanged trace right-sided pleural effusion and right lateral chest wall subcutaneous emphysema. Electronically Signed   By: Simonne Come M.D.   On: 04/21/2023 08:42   DG Chest 1 View  Result Date: 04/20/2023 CLINICAL DATA:  Follow-up pneumothorax EXAM: CHEST  1 VIEW COMPARISON:  Prior chest x-ray 04/19/2023 FINDINGS: Persistent and unchanged small apical pneumothorax with chest tube in place. Spiculated pleuroparenchymal scarring again noted in the right middle and right lower lobe. Cardiac and mediastinal contours remain unchanged. No acute osseous abnormality. IMPRESSION: 1. Stable small apical pneumothorax with well-positioned chest tube in place. 2. Otherwise, unchanged appearance of the lungs. Electronically Signed   By: Malachy Moan M.D.   On: 04/20/2023 12:06    DG Chest Port 1 View  Result Date: 04/19/2023 CLINICAL DATA:  67 year old female with history of right-sided pneumothorax. EXAM: PORTABLE CHEST 1 VIEW COMPARISON:  Chest x-ray 04/18/2023. FINDINGS: Small bore right-sided chest tube with pigtail reformed in the lateral aspect of the upper right hemithorax. Trace residual right apical pneumothorax noted. Patchy multifocal irregular opacities in the right mid to lower lung, similar to prior studies, corresponding with areas of postradiation fibrosis from prior chest CT. Left lung is clear. No pleural effusions. No evidence of pulmonary edema. Heart size is normal. Upper mediastinal contours are within normal limits. IMPRESSION: 1. Stable position of right-sided chest tube with trace residual right apical pneumothorax. Radiographic appearance the chest is otherwise unchanged. Electronically Signed   By: Trudie Reed M.D.   On: 04/19/2023 08:52   DG Chest Port 1 View  Result Date: 04/18/2023 CLINICAL DATA:  Follow up pneumothorax. EXAM: PORTABLE CHEST 1 VIEW COMPARISON:  Radiographs 04/17/2023 and 04/16/2023.  CT 04/16/2023. FINDINGS: 0543 hours. Interval  placement of a small caliber pigtail pleural catheter on the right, projecting over the right lateral lung apex. Minimal residual right apical pneumothorax. There is stable scarring at the right lung base. The lungs are otherwise clear. No pleural effusion. The heart size and mediastinal contours are stable with a leadless intracardiac pacemaker. Soft tissue emphysema noted about the chest tube insertion in the right lateral chest wall. No acute osseous findings. IMPRESSION: 1. Minimal residual right apical pneumothorax following chest tube placement. No other significant changes. 2. Stable right basilar scarring. Electronically Signed   By: Carey Bullocks M.D.   On: 04/18/2023 09:03   CT [facility] PLEURAL DRAIN W/INDWELL CATH W/IMG GUIDE  Result Date: 04/17/2023 INDICATION: Symptomatic right pneumothorax  EXAM: CT-GUIDED CHEST TUBE PLACEMENT MEDICATIONS: No periprocedural antibiotics were indicated ANESTHESIA/SEDATION: Lidocaine 1% subcutaneous COMPLICATIONS: None immediate. PROCEDURE: Informed written consent was obtained from the patient after a thorough discussion of the procedural risks, benefits and alternatives. All questions were addressed. Maximal Sterile Barrier Technique was utilized including caps, mask, sterile gowns, sterile gloves, sterile drape, hand hygiene and skin antiseptic. A timeout was performed prior to the initiation of the procedure. Select scans through the thorax obtained. Appropriate skin entry site was determined and marked. Region was prepped with chlorhexidine, draped in usual sterile fashion, infiltrated locally with 1% lidocaine. 7 cm Yueh sheath needle was advanced into the pleural space. Gas could be aspirated. Amplatz guidewire advanced easily. Tract dilated to facilitate placement of a 14 French pigtail drain catheter. Catheter was placed to Pleur-Evac suction device. Confirmatory CT demonstrated good catheter position directed towards the apex, with virtually complete evacuation of the pneumothorax. Small volume subcutaneous emphysema. The catheter was secured externally with 0 Prolene suture and StatLock and a sterile dressing applied. The patient tolerated the procedure well. RADIATION DOSE REDUCTION: This exam was performed according to the departmental dose-optimization program which includes automated exposure control, adjustment of the mA and/or kV according to patient size and/or use of iterative reconstruction technique. IMPRESSION: 1. Technically successful CT-guided right chest tube placement. Electronically Signed   By: Corlis Leak M.D.   On: 04/17/2023 14:29   DG Chest 2 View  Result Date: 04/17/2023 CLINICAL DATA:  67 year old female with history of lung cancer. Right hydropneumothorax, abnormal right middle and lower lobe on recent CT. EXAM: CHEST - 2 VIEW  COMPARISON:  Chest CT 04/16/2023 and earlier. FINDINGS: Seated AP and lateral views of the chest at 0559 hours. Persistent right pneumothorax, not significantly changed from the CT and radiographs on 04/16/2023. Pleural fluid component better demonstrated by CT. Mildly improved right lung base ventilation compared to the most recent radiographs with residual streaky and patchy opacity better characterized by CT. Stable cardiac size and mediastinal contours. Visualized tracheal air column is within normal limits. Left lung appears stable, negative. No acute osseous abnormality identified. Negative visible bowel gas. IMPRESSION: 1. Persistent right hydropneumothorax, no significant change from yesterday. 2. No new cardiopulmonary abnormality. Electronically Signed   By: Odessa Fleming M.D.   On: 04/17/2023 06:21   CT CHEST WO CONTRAST  Result Date: 04/16/2023 CLINICAL DATA:  Pneumothorax. Dyspnea on exertion that started yesterday. Dry cough. EXAM: CT CHEST WITHOUT CONTRAST TECHNIQUE: Multidetector CT imaging of the chest was performed following the standard protocol without IV contrast. RADIATION DOSE REDUCTION: This exam was performed according to the departmental dose-optimization program which includes automated exposure control, adjustment of the mA and/or kV according to patient size and/or use of iterative reconstruction technique. COMPARISON:  Radiograph  04/16/2023 at 3:35 p.m. and CT chest 03/24/2023 FINDINGS: Cardiovascular: Normal heart size. No pericardial effusion. Leadless pacer. Coronary artery and aortic atherosclerotic calcification. Mediastinum/Nodes: Trachea and esophagus are unremarkable. No thoracic adenopathy. No mediastinal shift Lungs/Pleura: Moderate right apical, anterior, and basilar pneumothorax as seen on same day chest radiograph. Small right pleural effusion. Slightly increased consolidation about the postradiation fibrosis and architectural distortion in the right middle lobe and right  lower lobe favored due to volume loss from the pneumothorax. Advanced centrilobular emphysema greatest in the upper lungs. Right apical scarring. Unchanged 4 mm right upper lobe nodule on series 2/image 33. Upper Abdomen: No acute abnormality. Musculoskeletal: Unchanged right rib fractures. IMPRESSION: 1. Moderate right hydropneumothorax.  No evidence of tension. 2. Slightly increased consolidation about the postradiation fibrosis and architectural distortion in the right middle lobe and right lower lobe favored due to volume loss from the pneumothorax. 3. Unchanged 4 mm right upper lobe nodule. Continued attention on follow-up. Aortic Atherosclerosis (ICD10-I70.0) and Emphysema (ICD10-J43.9). Electronically Signed   By: Minerva Fester M.D.   On: 04/16/2023 18:57   DG Chest 2 View  Result Date: 04/16/2023 CLINICAL DATA:  Dyspnea on exertion since yesterday. EXAM: CHEST - 2 VIEW COMPARISON:  November 18, 2022.  March 24, 2023. FINDINGS: Stable cardiomediastinal silhouette. Stable post treatment change seen in right lung base. Left lung is clear. Small right apical and basilar pneumothorax is noted. Bony thorax is unremarkable. IMPRESSION: Small right apical and basilar pneumothorax is noted. Critical Value/emergent results were called by telephone at the time of interpretation on 04/16/2023 at 4:07 pm to provider Dr. Lenard Lance, who verbally acknowledged these results. Electronically Signed   By: Lupita Raider M.D.   On: 04/16/2023 16:09    Microbiology: Results for orders placed or performed during the hospital encounter of 11/18/22  Culture, blood (routine x 2)     Status: None   Collection Time: 11/18/22 10:43 PM   Specimen: BLOOD  Result Value Ref Range Status   Specimen Description BLOOD LEFT HAND  Final   Special Requests   Final    BOTTLES DRAWN AEROBIC AND ANAEROBIC Blood Culture results may not be optimal due to an excessive volume of blood received in culture bottles   Culture   Final    NO  GROWTH 5 DAYS Performed at [facility] [place], [address] [place], [place] [address]    Report Status 11/23/2022 FINAL  Final  Culture, blood (routine x 2)     Status: None   Collection Time: 11/18/22 10:43 PM   Specimen: BLOOD  Result Value Ref Range Status   Specimen Description BLOOD LEFT ARM  Final   Special Requests   Final    BOTTLES DRAWN AEROBIC AND ANAEROBIC Blood Culture adequate volume   Culture   Final    NO GROWTH 5 DAYS Performed at [facility], [address] [place], [place] [address]    Report Status 11/23/2022 FINAL  Final    Labs: CBC: Recent Labs  Lab 04/18/23 0607 04/19/23 0611 04/20/23 0707 04/21/23 0536 04/22/23 0505  WBC 7.0 6.3 6.2 6.3 5.6  HGB 8.1* 7.4* 7.4* 7.6* 7.6*  HCT 27.9* 24.3* 24.9* 25.0* 25.1*  MCV 99.6 96.8 98.4 95.8 97.3  PLT 256 229 246 248 261   Basic Metabolic Panel: Recent Labs  Lab 04/17/23 0558 04/18/23 0607 04/20/23 0707  NA 139 137 138  K 4.5 4.1 4.0  CL 102 100 101  CO2 32 32 31  GLUCOSE  200* 176* 203*  BUN 34* 24* 18  CREATININE 0.67 0.61 0.55  CALCIUM 8.3* 8.7* 8.3*  MG  --   --  2.6*  PHOS  --   --  4.0   Liver Function Tests: Recent Labs  Lab 04/17/23 0558  AST 28  ALT 17  ALKPHOS 45  BILITOT 0.3  PROT 6.3*  ALBUMIN 3.4*   CBG: Recent Labs  Lab 04/21/23 1613 04/21/23 2102 04/22/23 0449 04/22/23 0758 04/22/23 1134  GLUCAP 167* 278* 197* 89 140*    Discharge time spent: less than 30 minutes.  Signed: Pennie Banter, DO [facility] 04/23/2023

## 2023-04-22 NOTE — Progress Notes (Addendum)
Physical Therapy Treatment Patient Details Name: Angela Jensen MRN: [id number] DOB: Dec 18, 1955 Today's Date: 04/22/2023   History of Present Illness Angela Jensen is a 67 y.o. female with medical history significant for shortness of breath.  Patient and has history of lung cancer and is on home O2 at 4 L.  Patient for a month has been short of breath.  No reports of fever hemoptysis cough congestion diarrhea chest pain palpitations any alleviating or worsening factors.  Patient has been using her inhalers and nebulizer treatments without relief.  Patient also reports that her left leg has been cramping intermittently follows up with Dr. Gilda Crease Dr. Wyn Quaker vascular surgery and has stents in her advised her to follow-up in clinic be related to circulation pulses in the left leg is 1+ right leg is 2+.  No reports of falls.    PT Comments  Pt presents laying in bed, no complaints of pain. She continues to be modi for bed mobility/ transfers and supervision for ambulation (~55ft). Pt ascended/descended 4 stairs with a rail on the R, CGA, and alternating step pattern. She continues to show improvements in activity tolerance and oxygen response to activity, care team updated about pt progress. She would benefit from continued skilled care to maximize functional ability, activity tolerance, and address deficits in high level balance activities.    If plan is discharge home, recommend the following: A little help with walking and/or transfers;A little help with bathing/dressing/bathroom;Assist for transportation;Help with stairs or ramp for entrance;Assistance with cooking/housework   Can travel by private vehicle        Equipment Recommendations  None recommended by PT (ha RW/Cane at home)    Recommendations for Other Services       Precautions / Restrictions Precautions Precautions: Fall Precaution Comments: watch O2, chest tube removed 9/3 Restrictions Weight Bearing Restrictions: No     Mobility   Bed Mobility Overal bed mobility: Modified Independent                  Transfers Overall transfer level: Modified independent                      Ambulation/Gait Ambulation/Gait assistance: Supervision Gait Distance (Feet): 500 Feet Assistive device: Rolling walker (2 wheels) Gait Pattern/deviations: Step-through pattern Gait velocity: WNL         Stairs Stairs: Yes Stairs assistance: Contact guard assist Stair Management: One rail Right, Alternating pattern Number of Stairs: 5 General stair comments: ascend/decend with rail on R side   Wheelchair Mobility     Tilt Bed    Modified Rankin (Stroke Patients Only)       Balance Overall balance assessment: Modified Independent                                          Cognition Arousal: Alert Behavior During Therapy: WFL for tasks assessed/performed Overall Cognitive Status: Within Functional Limits for tasks assessed                                          Exercises      General Comments General comments (skin integrity, edema, etc.): O2 at rest 95 (4L), ambulation on 4L dropped to 85%, amulation on 6L >90%      Pertinent  Vitals/Pain Pain Assessment Pain Assessment: No/denies pain    Home Living Family/patient expects to be discharged to:: Private residence Living Arrangements: Alone                      Prior Function            PT Goals (current goals can now be found in the care plan section) Acute Rehab PT Goals Patient Stated Goal: to improve, go home Progress towards PT goals: Progressing toward goals    Frequency    Min 1X/week      PT Plan      Co-evaluation              AM-PAC PT "6 Clicks" Mobility   Outcome Measure  Help needed turning from your back to your side while in a flat bed without using bedrails?: None Help needed moving from lying on your back to sitting on the side of a flat bed without using  bedrails?: None Help needed moving to and from a bed to a chair (including a wheelchair)?: None Help needed standing up from a chair using your arms (e.g., wheelchair or bedside chair)?: None Help needed to walk in hospital room?: A Little Help needed climbing 3-5 steps with a railing? : A Little 6 Click Score: 22    End of Session Equipment Utilized During Treatment: Oxygen Activity Tolerance: Patient tolerated treatment well Patient left: in chair;with call bell/phone within reach;Other (comment) (MD in room)   PT Visit Diagnosis: Muscle weakness (generalized) (M62.81)     Time: 1610-9604 PT Time Calculation (min) (ACUTE ONLY): 17 min  Charges:    $Gait Training: 8-22 mins PT General Charges $$ ACUTE PT VISIT: 1 Visit                    Jazman Reuter, PT, SPT 11:41 AM,04/22/23

## 2023-04-22 NOTE — TOC Transition Note (Addendum)
Transition of Care [facility]) - CM/SW Discharge Note   Patient Details  Name: Angela Jensen MRN: [id number] Date of Birth: [birth year]  Transition of Care [facility]) CM/SW Contact:  Truddie Hidden, RN Phone Number: 04/22/2023, 12:11 PM   Clinical Narrative:    Spoke with patient at bedside regarding discharge home today. Patient husband will transport her home and is aware to bring her home oxygen for transport. Patient advised a representative for [facility] will contact her to scheduled the [facility].  Cheryl from [place] notified of discharge   TOC signing off.          Patient Goals and CMS Choice      Discharge Placement                         Discharge Plan and Services Additional resources added to the After Visit Summary for                                       Social Determinants of Health (SDOH) Interventions SDOH Screenings   Food Insecurity: No Food Insecurity (11/19/2022)  Housing: Low Risk  (11/19/2022)  Transportation Needs: No Transportation Needs (11/19/2022)  Utilities: Not At Risk (11/19/2022)  Alcohol Screen: Low Risk  (12/11/2021)  Depression (PHQ2-9): Low Risk  (10/08/2022)  Tobacco Use: Medium Risk (04/16/2023)     Readmission Risk Interventions    06/13/2021    2:40 PM 05/05/2021   12:24 PM  Readmission Risk Prevention Plan  Transportation Screening Complete Complete  PCP or Specialist Appt within 5-7 Days  Complete  Home Care Screening  Complete  Medication Review (RN CM)  Complete  Social Work Consult for Recovery Care Planning/Counseling Complete   Palliative Care Screening Not Applicable   Medication Review Oceanographer) Complete

## 2023-04-22 NOTE — Plan of Care (Signed)
  Problem: Activity: Goal: Risk for activity intolerance will decrease Outcome: Progressing   Problem: Pain Managment: Goal: General experience of comfort will improve Outcome: Progressing   Problem: Safety: Goal: Ability to remain free from injury will improve Outcome: Progressing   

## 2023-04-23 ENCOUNTER — Encounter: Payer: Self-pay | Admitting: Internal Medicine

## 2023-04-26 NOTE — Group Note (Deleted)

## 2023-04-27 ENCOUNTER — Other Ambulatory Visit: Payer: Self-pay

## 2023-04-27 ENCOUNTER — Telehealth: Payer: Self-pay | Admitting: Nurse Practitioner

## 2023-04-27 DIAGNOSIS — Z760 Encounter for issue of repeat prescription: Secondary | ICD-10-CM

## 2023-04-27 DIAGNOSIS — I442 Atrioventricular block, complete: Secondary | ICD-10-CM | POA: Diagnosis not present

## 2023-04-27 MED ORDER — FUROSEMIDE 20 MG PO TABS: 20.0000 mg | ORAL_TABLET | Freq: Every day | ORAL | 5 refills | Status: DC | PRN

## 2023-04-27 NOTE — Telephone Encounter (Signed)
Lvm not set up to lvm for pt regarding scheduling HFU

## 2023-04-29 ENCOUNTER — Encounter: Payer: Self-pay | Admitting: Nurse Practitioner

## 2023-04-29 ENCOUNTER — Ambulatory Visit (INDEPENDENT_AMBULATORY_CARE_PROVIDER_SITE_OTHER): Payer: 59 | Admitting: Nurse Practitioner

## 2023-04-29 VITALS — BP 130/76 | HR 94 | Temp 98.2°F | Resp 16 | Ht 59.0 in | Wt 121.0 lb

## 2023-04-29 DIAGNOSIS — J9611 Chronic respiratory failure with hypoxia: Secondary | ICD-10-CM

## 2023-04-29 DIAGNOSIS — J449 Chronic obstructive pulmonary disease, unspecified: Secondary | ICD-10-CM

## 2023-04-29 DIAGNOSIS — L89312 Pressure ulcer of right buttock, stage 2: Secondary | ICD-10-CM

## 2023-04-29 DIAGNOSIS — L720 Epidermal cyst: Secondary | ICD-10-CM | POA: Diagnosis not present

## 2023-04-29 DIAGNOSIS — Z09 Encounter for follow-up examination after completed treatment for conditions other than malignant neoplasm: Secondary | ICD-10-CM

## 2023-04-29 MED ORDER — MUPIROCIN 2 % EX OINT
1.0000 | TOPICAL_OINTMENT | Freq: Two times a day (BID) | CUTANEOUS | 1 refills | Status: DC
Start: 2023-04-29 — End: 2023-06-18

## 2023-04-29 NOTE — Progress Notes (Signed)
[facility] [place], [place] [address] [place] [address] [phone]                                   Transitional Care Clinic   [facility] Discharge Acute Issues Care Follow Up                                                                        Patient Demographics  Angela Jensen, is a 67 y.o. female  DOB August 20, 1955  MRN [id number].  Primary MD  Sallyanne Kuster, NP  Admit date:     04/16/2023  Discharge date: 04/22/2023    Reason for TCC follow Up - acute COPD exacerbation, pneumothorax   Past Medical History:  Diagnosis Date   Anemia    Arthritis    Asthma    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/2016   Cancer (HCC) 2012   Right Lung CA   COPD (chronic obstructive pulmonary disease) (HCC)    Depression    Diabetes mellitus without complication (HCC)    Patient takes Janumet   Essential hypertension 05/26/2016   Heart failure (HCC) 2022   Hydropneumothorax 05/03/2020   Hypercholesteremia    Oxygen dependent    2L at nite    PAD (peripheral artery disease) (HCC) 06/22/2016   Peripheral vascular disease (HCC)    Personal history of radiation therapy    Shortness of breath dyspnea    with exertion    Sialolithiasis    Sleep apnea    Wears dentures    full upper and lower    Past Surgical History:  Procedure Laterality Date   CESAREAN SECTION     x3   COLONOSCOPY WITH PROPOFOL N/A 06/25/2015   Procedure: COLONOSCOPY WITH PROPOFOL;  Surgeon: Midge Minium, MD;  Location: [facility] ENDOSCOPY;  Service: Endoscopy;  Laterality: N/A;   COLONOSCOPY WITH PROPOFOL N/A 07/26/2020   Procedure: COLONOSCOPY WITH PROPOFOL;  Surgeon: Midge Minium, MD;  Location: [facility];  Service: Endoscopy;  Laterality: N/A;   CYST REMOVAL LEG     and on shoulder    ESOPHAGOGASTRODUODENOSCOPY (EGD) WITH PROPOFOL N/A 07/26/2020   Procedure: ESOPHAGOGASTRODUODENOSCOPY (EGD) WITH PROPOFOL;   Surgeon: Midge Minium, MD;  Location: [facility];  Service: Endoscopy;  Laterality: N/A;  Diabetic - oral meds   LOWER EXTREMITY ANGIOGRAPHY Left 09/29/2018   Procedure: LOWER EXTREMITY ANGIOGRAPHY;  Surgeon: Annice Needy, MD;  Location: [facility] INVASIVE CV LAB;  Service: Cardiovascular;  Laterality: Left;   LUNG BIOPSY  12/30/2011   has lung "spots"   PACEMAKER IMPLANT  07/14/2021   PACEMAKER LEADLESS INSERTION N/A 07/14/2021   Procedure: PACEMAKER LEADLESS INSERTION;  Surgeon: Marcina Millard, MD;  Location: [facility] INVASIVE CV LAB;  Service: Cardiovascular;  Laterality: N/A;   PERIPHERAL VASCULAR CATHETERIZATION Left 06/01/2016   Procedure: Lower Extremity Angiography;  Surgeon: Annice Needy, MD;  Location: [facility] INVASIVE CV LAB;  Service: Cardiovascular;  Laterality: Left;   PERIPHERAL VASCULAR CATHETERIZATION N/A 06/01/2016   Procedure: Abdominal Aortogram w/Lower Extremity;  Surgeon: Annice Needy, MD;  Location: [facility] INVASIVE CV LAB;  Service: Cardiovascular;  Laterality:  N/A;   PERIPHERAL VASCULAR CATHETERIZATION  06/01/2016   Procedure: Lower Extremity Intervention;  Surgeon: Annice Needy, MD;  Location: [facility] INVAS[facility] CV LAB;  Service: Cardiovascular;;   PERIPHERAL VASCULAR CATHETERIZATION Right 06/08/2016   Procedure: Lower Extremity Angiography;  Surgeon: Annice Needy, MD;  Location: [facility] INVASIVE CV LAB;  Service: Cardiovascular;  Laterality: Right;   PERIPHERAL VASCULAR CATHETERIZATION  06/08/2016   Procedure: Lower Extremity Intervention;  Surgeon: Annice Needy, MD;  Location: [facility] INVASIVE CV LAB;  Service: Cardiovascular;;   SUBMANDIBULAR GLAND EXCISION Left 12/06/2020   Procedure: EXCISION SUBMANDIBULAR GLAND;  Surgeon: Linus Salmons, MD;  Location: [facility];  Service: ENT;  Laterality: Left;  needs to be first case Diabetic - diet controlled   TEMPORARY PACEMAKER N/A 07/11/2021   Procedure: TEMPORARY PACEMAKER;  Surgeon: Marcina Millard, MD;  Location: [facility]  INVASIVE CV LAB;  Service: Cardiovascular;  Laterality: N/A;     Recent HPI and Hospital Course  67 year old female with a known history of COPD on 4 L nasal cannula at baseline, right lung cancer, s/p radiation therapy, right pneumothorax in 2021 which required chest tube and hospital admission.  Admitted for worsening shortness of breath and acute hypoxia.     Work up revealed right-sided hydropneumothorax for which a chest tube was placed by IR on 04/17/2023.  General surgery following for chest tube management.  Dr. Janyth Contes, medical oncology, consulted to assist with the management on 04/18/23.  With minimal output from the chest tube and O2 requirement back to baseline, chest tube was removed on 04/20/2023 by general surgery.   Further details of hospital course and management as outlined below.   04/22/23 -- pt doing well. Ambulating around the unit on her baseline 4 L/min oxygen. Dyspnea is improved and pt reports this is at baseline.  She is medically stable and agreeable to d/c home today.  Right hydro pneumothorax status post right chest tube placement by IR on 04/17/2023, Dr. Deanne Coffer. Chest tube was managed by general surgery.   Removed on 04/20/2023 due to minimal output. CXR this AM 9/4 - no definitive evidence of residual or recurrent pneumothorax   Acute on chronic hypoxic respiratory failure, resolved She is back to her baseline oxygen requirement of 4 L/min, stable with ambulation. Continue home regimen of inhalers / bronchodilators   Chronic combined systolic and diastolic CHF (congestive heart failure) (HCC) Stable.  Compensated.  Continue Lasix. Continue Daily weights.    Malignant neoplasm of upper lobe of right lung (HCC) Pt being followed by oncology Dr. 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 -Recurrence-SBRT completed 06/11/2020.NGS showed TMB >10 muts/mb, MS stable, KRAS G12V, MYC amplification,STK11 S227fs*47, FUBP1 splice sit  473+1G" Outpatient follow-up with oncology   Essential hypertension Blood pressure well-controlled   Type 2 Diabetes with hyperglycemia SSI during admission. Resume home regimen at discharge   PAD (peripheral artery disease) (HCC) Pt has stents for her PAD and f/u with dr. Lorretta Harp.   Continue Plavix    Post Hospital Acute Care Issue to be followed in the Clinic   SOB (shortness of breath) Active Problems:   Chronic combined systolic and diastolic CHF (congestive heart failure) (HCC)   Malignant neoplasm of upper lobe of right lung (HCC)   Essential hypertension   Diabetes (HCC)   PAD (peripheral artery disease) (HCC)   Pneumothorax   Resolved Problems:   Hydropneumothorax     Subjective:   Jolinda Croak today has, No headache, No chest pain, No  abdominal pain - No Nausea, No new weakness tingling or numbness, has baseline SOB and cough, feel much better overall   Assessment & Plan   1. Hospital discharge follow-up Treated for acute COPD exacerbation with steroids, IV antibiotics and breathing treatments. Continues to require continuous supplement oxygen and is on her baseline of 4 LPM via [place].   2. Chronic obstructive pulmonary disease, unspecified COPD type (HCC) Current daily regimen includes dulera inhaler twice daily, spiriva once daily and she has prn albuterol rescue inhaler and prn duoneb treatments.  The other medications she has tried includes trelegy ellipta, incruse ellipta, breo ellipta, and advair diskus.  Dependent on continuous supplemental oxygen, on her baseline of 4 LPM via [place].  Discussed trying a new nebulized medication called Ohtuvayre and patient is interested in trying this. Forms for ordering the medication and getting financial assistance with [place] [employer] has been completed.  - Ensifentrine (OHTUVAYRE) 3 MG/2.5ML SUSP; Inhale 3 mg into the lungs in the morning and at bedtime.  Dispense: 150 mL; Refill: 11  3. Chronic respiratory failure with  hypoxia (HCC) See problem # 2 for current treatments. On continuous oxygen 4 LPM via [place].   4. Epidermoid cyst of skin of back Noted on lower back, has been there a while, will discuss dermatology referral at next office visit.   5. Pressure injury of right buttock, stage 2 (HCC) Wound looks like it is healing some, apply mupirocin to the wound twice daily until healed.  - mupirocin ointment (BACTROBAN) 2 %; Apply 1 Application topically 2 (two) times daily. To wound on buttocks until healed.  Dispense: 30 g; Refill: 1   Reason for frequent admissions/ER visits    chronic respiratory failure with hypoxia Heart failure COPD Diabetes    Objective:   Vitals:   04/29/23 1446  BP: 130/76  Pulse: 94  Resp: 16  Temp: 98.2 F (36.8 C)  SpO2: 94%  Weight: 121 lb (54.9 kg)  Height: 4\' 11"  (1.499 m)    Wt Readings from Last 3 Encounters:  04/29/23 121 lb (54.9 kg)  04/22/23 121 lb 8 oz (55.1 kg)  04/12/23 133 lb 6.4 oz (60.5 kg)    Allergies as of 04/29/2023       Reactions   Trelegy Ellipta [fluticasone-umeclidin-vilant]    Had breathing issues        Medication List        Accurate as of April 29, 2023 11:59 PM. If you have any questions, ask your nurse or doctor.          Accu-Chek Guide test strip Generic drug: glucose blood USE TO TEST BLOOD SUGARS 5 TIMES DAILY   Accu-Chek Guide w/Device Kit Use as directed Dx e11.65   Accu-Chek Softclix Lancets lancets Use 1 lancet 3 times daily to check glucose for diabetes   albuterol 108 (90 Base) MCG/ACT inhaler Commonly known as: VENTOLIN HFA INHALE 2 PUFFS BY MOUTH EVERY 6 HOURS AS NEEDED FOR WHEEZING OR SHORTNESS OF BREATH   ALPRAZolam 0.25 MG tablet Commonly known as: XANAX TAKE 1 TABLET BY MOUTH THREE TIMES DAILY AS NEEDED FOR ANXIETY   calcium-vitamin D 500-5 MG-MCG tablet Commonly known as: OSCAL WITH D Take 1 tablet by mouth 2 (two) times daily.   clopidogrel 75 MG tablet Commonly known as:  PLAVIX Take 1 tablet (75 mg total) by mouth daily.   cyclobenzaprine 10 MG tablet Commonly known as: FLEXERIL Take 1 tablet (10 mg total) by mouth at bedtime. Take one  tab po qhs for back spasm prn only   Dexcom G7 Receiver Devi Use one as directed for uncontrolled dm   Dexcom G7 Sensor Misc Apply 1 sensor to skin on designated areas every 10 days to monitor glucose levels with sensor or smart phone app   escitalopram 10 MG tablet Commonly known as: LEXAPRO Take 1 tablet (10 mg total) by mouth daily.   famotidine 20 MG tablet Commonly known as: PEPCID Take 1 tablet (20 mg total) by mouth daily for 14 days. What changed:  when to take this reasons to take this   ferrous sulfate 325 (65 FE) MG tablet Take 325 mg by mouth 2 (two) times daily with a meal.   Fish Oil 1000 MG Caps Take 1 capsule by mouth daily.   fluticasone 50 MCG/ACT nasal spray Commonly known as: FLONASE Place 2 sprays into both nostrils daily. What changed:  when to take this reasons to take this   furosemide 20 MG tablet Commonly known as: LASIX Take 1 tablet (20 mg total) by mouth daily as needed.   gabapentin 300 MG capsule Commonly known as: NEURONTIN Take 1 capsule (300 mg total) by mouth 3 (three) times daily. What changed:  how much to take when to take this   HYDROcodone-acetaminophen 5-325 MG tablet Commonly known as: NORCO/VICODIN Take 1 tablet by mouth every 4 (four) hours as needed for moderate pain.   ipratropium-albuterol 0.5-2.5 (3) MG/3ML Soln Commonly known as: DUONEB Take 3 mLs by nebulization every 4 (four) hours as needed.   Levemir FlexTouch 100 UNIT/ML FlexTouch Pen Generic drug: insulin detemir Inject 15 Units into the skin daily with supper.   lisinopril-hydrochlorothiazide 20-25 MG tablet Commonly known as: ZESTORETIC Take 1 tablet by mouth daily.   metFORMIN 500 MG tablet Commonly known as: GLUCOPHAGE TAKE 2 TABLETS BY MOUTH ONCE DAILY WITH BREAKFAST    metoprolol succinate 25 MG 24 hr tablet Commonly known as: TOPROL-XL Take 1 tablet (25 mg total) by mouth daily.   mometasone-formoterol 200-5 MCG/ACT Aero Commonly known as: DULERA Inhale 2 puffs into the lungs 2 (two) times daily.   mupirocin ointment 2 % Commonly known as: BACTROBAN Apply 1 Application topically 2 (two) times daily. To wound on buttocks until healed. Started by: Cori Razor 3 MG/2.5ML Susp Generic drug: Ensifentrine Inhale 3 mg into the lungs in the morning and at bedtime. Started by: Sallyanne Kuster   OXYGEN Inhale 4 L into the lungs. PT USES [facility] FOR OXYGEN   potassium chloride 10 MEQ tablet Commonly known as: KLOR-CON Take 1 tablet (10 mEq total) by mouth every other day.   ReliOn Pen Needles 31G X 6 MM Misc Generic drug: Insulin Pen Needle USE 1 PEN NEEDLE WITH INSULIN PEN ONCE DAILY FOR DIABETES   Spiriva HandiHaler 18 MCG inhalation capsule Generic drug: tiotropium INHALE THE CONTENTS OF 1 CAPSULES BY MOUTH ONCE DAILY - DO NOT SWALLOW CAPSULES   VITAMIN D (CHOLECALCIFEROL) PO Take 1 tablet by mouth daily with supper.         Physical Exam: Constitutional: Patient appears well-developed and well-nourished. Not in obvious distress. HENT: Normocephalic, atraumatic, External right and left ear normal. Oropharynx is clear and moist.  Eyes: Conjunctivae and EOM are normal. PERRLA, no scleral icterus. Neck: Normal ROM. Neck supple. No JVD. No tracheal deviation. No thyromegaly. CVS: RRR, S1/S2 +, no murmurs, no gallops, no carotid bruit.  Pulmonary: Effort and breath sounds normal, no stridor, rhonchi rales. Intermittent wheezing.  Abdominal: Soft. BS +, no distension, tenderness, rebound or guarding.  Musculoskeletal: Normal range of motion. No edema and no tenderness.  Lymphadenopathy: No lymphadenopathy noted, cervical, inguinal or axillary Neuro: Alert. Normal reflexes, muscle tone coordination. No cranial nerve  deficit. Skin: Skin is warm and dry. No rash noted. Not diaphoretic. No erythema. No pallor. Psychiatric: Normal mood and affect. Behavior, judgment, thought content normal.   Data Review   Micro Results No results found for this or any previous visit (from the past 240 hour(s)).   CBC No results for input(s): "WBC", "HGB", "HCT", "PLT", "MCV", "MCH", "MCHC", "RDW", "LYMPHSABS", "MONOABS", "EOSABS", "BASOSABS", "BANDABS" in the last 168 hours.  Invalid input(s): "NEUTRABS", "BANDSABD"  Chemistries  No results for input(s): "NA", "K", "CL", "CO2", "GLUCOSE", "BUN", "CREATININE", "CALCIUM", "MG", "AST", "ALT", "ALKPHOS", "BILITOT" in the last 168 hours.  Invalid input(s): "GFRCGP" ------------------------------------------------------------------------------------------------------------------ estimated creatinine clearance is 51.6 mL/min (by C-G formula based on SCr of 0.55 mg/dL). ------------------------------------------------------------------------------------------------------------------ No results for input(s): "HGBA1C" in the last 72 hours. ------------------------------------------------------------------------------------------------------------------ No results for input(s): "CHOL", "HDL", "LDLCALC", "TRIG", "CHOLHDL", "LDLDIRECT" in the last 72 hours. ------------------------------------------------------------------------------------------------------------------ No results for input(s): "TSH", "T4TOTAL", "T3FREE", "THYROIDAB" in the last 72 hours.  Invalid input(s): "FREET3" ------------------------------------------------------------------------------------------------------------------ No results for input(s): "VITAMINB12", "FOLATE", "FERRITIN", "TIBC", "IRON", "RETICCTPCT" in the last 72 hours.  Coagulation profile No results for input(s): "INR", "PROTIME" in the last 168 hours.  No results for input(s): "DDIMER" in the last 72 hours.  Cardiac Enzymes No  results for input(s): "CKMB", "TROPONINI", "MYOGLOBIN" in the last 168 hours.  Invalid input(s): "CK" ------------------------------------------------------------------------------------------------------------------ Invalid input(s): "POCBNP"  Return for previously scheduled pulm appt in november with Lauren PA-C and routine f/u with Celestine Bougie PCP in dec.  Time Spent in minutes  45 Time spent with patient included reviewing progress notes, labs, imaging studies, and discussing plan for follow up.   This patient was seen by Sallyanne Kuster, FNP-C in collaboration with Dr. Beverely Risen as a part of collaborative care agreement.    Sallyanne Kuster MSN, FNP-C on 04/29/2023 at 3:42 PM   **Disclaimer: This note may have been dictated with voice recognition software. Similar sounding words can inadvertently be transcribed and this note may contain transcription errors which may not have been corrected upon publication of note.**

## 2023-04-30 ENCOUNTER — Encounter: Payer: Self-pay | Admitting: Nurse Practitioner

## 2023-04-30 ENCOUNTER — Telehealth: Payer: Self-pay

## 2023-04-30 MED ORDER — OHTUVAYRE 3 MG/2.5ML IN SUSP: 3.0000 mg | Freq: Two times a day (BID) | RESPIRATORY_TRACT | 11 refills | Status: DC

## 2023-04-30 NOTE — Telephone Encounter (Signed)
Angela Jensen to [phone] for medication

## 2023-05-05 ENCOUNTER — Other Ambulatory Visit: Payer: Self-pay | Admitting: Nurse Practitioner

## 2023-05-05 DIAGNOSIS — E1165 Type 2 diabetes mellitus with hyperglycemia: Secondary | ICD-10-CM

## 2023-05-07 DIAGNOSIS — R0602 Shortness of breath: Secondary | ICD-10-CM | POA: Diagnosis not present

## 2023-05-07 DIAGNOSIS — J449 Chronic obstructive pulmonary disease, unspecified: Secondary | ICD-10-CM | POA: Diagnosis not present

## 2023-05-07 DIAGNOSIS — I10 Essential (primary) hypertension: Secondary | ICD-10-CM | POA: Diagnosis not present

## 2023-05-08 ENCOUNTER — Encounter: Payer: Self-pay | Admitting: Nurse Practitioner

## 2023-05-10 ENCOUNTER — Ambulatory Visit: Payer: 59 | Admitting: Nurse Practitioner

## 2023-05-18 ENCOUNTER — Telehealth: Payer: Self-pay | Admitting: Nurse Practitioner

## 2023-05-18 NOTE — Telephone Encounter (Signed)
Received Medical Attestation Statement from [employer]. Gave to Alyssa for signature-Toni

## 2023-05-20 ENCOUNTER — Encounter: Payer: Self-pay | Admitting: Hematology and Oncology

## 2023-05-20 ENCOUNTER — Telehealth: Payer: Self-pay | Admitting: Nurse Practitioner

## 2023-05-20 NOTE — Telephone Encounter (Signed)
Medical Attestation faxed back to [employer]; [phone]. Scanned-Toni

## 2023-05-27 ENCOUNTER — Other Ambulatory Visit: Payer: Self-pay | Admitting: Nurse Practitioner

## 2023-05-27 ENCOUNTER — Ambulatory Visit (INDEPENDENT_AMBULATORY_CARE_PROVIDER_SITE_OTHER): Payer: Medicare Other | Admitting: Nurse Practitioner

## 2023-05-27 ENCOUNTER — Encounter (INDEPENDENT_AMBULATORY_CARE_PROVIDER_SITE_OTHER): Payer: Medicare Other

## 2023-05-27 DIAGNOSIS — F419 Anxiety disorder, unspecified: Secondary | ICD-10-CM

## 2023-05-31 ENCOUNTER — Other Ambulatory Visit (INDEPENDENT_AMBULATORY_CARE_PROVIDER_SITE_OTHER): Payer: Self-pay | Admitting: Vascular Surgery

## 2023-05-31 ENCOUNTER — Other Ambulatory Visit: Payer: Self-pay | Admitting: Nurse Practitioner

## 2023-05-31 DIAGNOSIS — E1165 Type 2 diabetes mellitus with hyperglycemia: Secondary | ICD-10-CM

## 2023-05-31 DIAGNOSIS — I70213 Atherosclerosis of native arteries of extremities with intermittent claudication, bilateral legs: Secondary | ICD-10-CM

## 2023-06-03 ENCOUNTER — Ambulatory Visit (INDEPENDENT_AMBULATORY_CARE_PROVIDER_SITE_OTHER): Payer: 59

## 2023-06-03 DIAGNOSIS — I70213 Atherosclerosis of native arteries of extremities with intermittent claudication, bilateral legs: Secondary | ICD-10-CM | POA: Diagnosis not present

## 2023-06-03 LAB — VAS US ABI WITH/WO TBI
Left ABI: 0.68
Right ABI: 0.89

## 2023-06-06 DIAGNOSIS — R0602 Shortness of breath: Secondary | ICD-10-CM | POA: Diagnosis not present

## 2023-06-06 DIAGNOSIS — J449 Chronic obstructive pulmonary disease, unspecified: Secondary | ICD-10-CM | POA: Diagnosis not present

## 2023-06-06 DIAGNOSIS — I10 Essential (primary) hypertension: Secondary | ICD-10-CM | POA: Diagnosis not present

## 2023-06-08 ENCOUNTER — Other Ambulatory Visit: Payer: Self-pay

## 2023-06-08 ENCOUNTER — Other Ambulatory Visit: Payer: Self-pay | Admitting: *Deleted

## 2023-06-08 ENCOUNTER — Telehealth: Payer: Self-pay | Admitting: *Deleted

## 2023-06-08 ENCOUNTER — Encounter: Payer: Self-pay | Admitting: Hospice and Palliative Medicine

## 2023-06-08 ENCOUNTER — Ambulatory Visit
Admission: RE | Admit: 2023-06-08 | Discharge: 2023-06-08 | Disposition: A | Discharge status: Home / Self Care | Payer: 59 | Attending: Hospice and Palliative Medicine | Admitting: Hospice and Palliative Medicine

## 2023-06-08 ENCOUNTER — Ambulatory Visit
Admission: RE | Admit: 2023-06-08 | Discharge: 2023-06-08 | Disposition: A | Discharge status: Home / Self Care | Payer: 59 | Source: Ambulatory Visit | Attending: Hospice and Palliative Medicine

## 2023-06-08 ENCOUNTER — Inpatient Hospital Stay: Payer: 59 | Attending: Radiation Oncology | Admitting: Hospice and Palliative Medicine

## 2023-06-08 VITALS — BP 134/71 | HR 66 | Temp 97.8°F | Resp 22 | Ht 59.0 in | Wt 123.0 lb

## 2023-06-08 DIAGNOSIS — C3411 Malignant neoplasm of upper lobe, right bronchus or lung: Secondary | ICD-10-CM | POA: Diagnosis not present

## 2023-06-08 DIAGNOSIS — C3491 Malignant neoplasm of unspecified part of right bronchus or lung: Secondary | ICD-10-CM

## 2023-06-08 DIAGNOSIS — R0602 Shortness of breath: Secondary | ICD-10-CM | POA: Insufficient documentation

## 2023-06-08 DIAGNOSIS — I5042 Chronic combined systolic (congestive) and diastolic (congestive) heart failure: Secondary | ICD-10-CM | POA: Diagnosis not present

## 2023-06-08 DIAGNOSIS — R0600 Dyspnea, unspecified: Secondary | ICD-10-CM

## 2023-06-08 DIAGNOSIS — Z923 Personal history of irradiation: Secondary | ICD-10-CM | POA: Insufficient documentation

## 2023-06-08 DIAGNOSIS — I509 Heart failure, unspecified: Secondary | ICD-10-CM | POA: Diagnosis not present

## 2023-06-08 DIAGNOSIS — Z9981 Dependence on supplemental oxygen: Secondary | ICD-10-CM | POA: Diagnosis not present

## 2023-06-08 DIAGNOSIS — J449 Chronic obstructive pulmonary disease, unspecified: Secondary | ICD-10-CM | POA: Diagnosis not present

## 2023-06-08 NOTE — Telephone Encounter (Signed)
Dr. Cathie Hoops recommends [facility] eval for weakness and shortness of breath

## 2023-06-08 NOTE — Telephone Encounter (Signed)
Patient will need a stat chest xray- order entered. Pt has a recent h/o pneumothorax. She has a h/o chf, copd, lung cancer.

## 2023-06-08 NOTE — Telephone Encounter (Signed)
RN also attempted to reach patient - 1402-no answer. Vm box is not set up. Unable to leave msgs. Pt is not on mychart. We could get the chest xray today and see her tomorrow.

## 2023-06-08 NOTE — Patient Instructions (Signed)
Please go to the [facility] at [facility] or the [facility] outpatient Imaging center for a chest xray today.

## 2023-06-08 NOTE — Telephone Encounter (Addendum)
Given hx of lung cancer and headache, Dr.Yu recommends pt to get MRI brain w wo contrast ASAP. Pt informed of plan and verbalized understanding. Per Trula Ore, no auth required.   Please contact pt with appt once scheduled.

## 2023-06-08 NOTE — Progress Notes (Unsigned)
[facility] at [facility] Telephone:([phone]) [phone] Fax:(336) [phone]  Patient Care Team: Sallyanne Kuster, NP as PCP - General (Nurse Practitioner) Carmina Miller, MD as Referring Physician (Radiation Oncology) Midge Minium, MD as Consulting Physician (Gastroenterology) Rickard Patience, MD as Consulting Physician (Oncology)   NAME OF PATIENT: Angela Jensen  [id number]  1956/03/26   DATE OF VISIT: 06/08/23  REASON FOR CONSULT: Angela Jensen is a 67 y.o. 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    INTERVAL HISTORY: Recently found to have a small 4 mm lung nodule in the right upper lobe but too small for PET or biopsy.  Plan was for surveillance.  Patient was hospitalized 04/16/2023 to 04/22/2023 with a right hydropneumothorax status post chest tube placement (removed 04/20/2023 due to minimal output).  Chest x-ray had discharged and showed no evidence of residual recurrent pneumothorax.  Patient presents to [facility] with shortness of breath.  Denies any neurologic complaints. Denies recent fevers or illnesses. Denies any easy bleeding or bruising. Reports good appetite and denies weight loss. Denies chest pain. Denies any nausea, vomiting, constipation, or diarrhea. Denies urinary complaints. Patient offers no further specific complaints today.   PAST MEDICAL HISTORY: Past Medical History:  Diagnosis Date   Anemia    Arthritis    Asthma    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/2016   Cancer (HCC) 2012   Right Lung CA   COPD (chronic obstructive pulmonary disease) (HCC)    Depression    Diabetes mellitus without complication (HCC)    Patient takes Janumet   Essential  hypertension 05/26/2016   Heart failure (HCC) 2022   Hydropneumothorax 05/03/2020   Hypercholesteremia    Oxygen dependent    2L at nite    PAD (peripheral artery disease) (HCC) 06/22/2016   Peripheral vascular disease (HCC)    Personal history of radiation therapy    Shortness of breath dyspnea    with exertion    Sialolithiasis    Sleep apnea    Wears dentures    full upper and lower    PAST SURGICAL HISTORY:  Past Surgical History:  Procedure Laterality Date   CESAREAN SECTION     x3   COLONOSCOPY WITH PROPOFOL N/A 06/25/2015   Procedure: COLONOSCOPY WITH PROPOFOL;  Surgeon: Midge Minium, MD;  Location: [facility] ENDOSCOPY;  Service: Endoscopy;  Laterality: N/A;   COLONOSCOPY WITH PROPOFOL N/A 07/26/2020   Procedure: COLONOSCOPY WITH PROPOFOL;  Surgeon: Midge Minium, MD;  Location: [facility];  Service: Endoscopy;  Laterality: N/A;   CYST REMOVAL LEG     and on shoulder    ESOPHAGOGASTRODUODENOSCOPY (EGD) WITH PROPOFOL N/A 07/26/2020   Procedure: ESOPHAGOGASTRODUODENOSCOPY (EGD) WITH PROPOFOL;  Surgeon: Midge Minium, MD;  Location: [facility];  Service: Endoscopy;  Laterality: N/A;  Diabetic - oral meds   LOWER EXTREMITY ANGIOGRAPHY Left 09/29/2018   Procedure: LOWER EXTREMITY ANGIOGRAPHY;  Surgeon: Annice Needy, MD;  Location: [facility] INVASIVE CV LAB;  Service: Cardiovascular;  Laterality: Left;   LUNG BIOPSY  12/30/2011   has lung "spots"   PACEMAKER IMPLANT  07/14/2021   PACEMAKER LEADLESS INSERTION N/A 07/14/2021   Procedure: PACEMAKER LEADLESS INSERTION;  Surgeon: Marcina Millard, MD;  Location: [facility] INVASIVE CV LAB;  Service: Cardiovascular;  04/17/2014 1055   Comprehensive Metabolic Panel:    Component Value Date/Time   NA 138 04/20/2023 0707   NA 137 03/28/2020 1037   NA 141 04/17/2014 1055   K 4.0 04/20/2023 0707   K 3.9 04/17/2014 1055   CL 101 04/20/2023 0707   CL 102 04/17/2014 1055   CO2 31 04/20/2023 0707   CO2 30 04/17/2014 1055   BUN 18 04/20/2023 0707   BUN 10 03/28/2020 1037   BUN 10 04/17/2014 1055   CREATININE 0.55 04/20/2023 0707   CREATININE 0.90 04/17/2014 1055   GLUCOSE 203 (H) 04/20/2023 0707   GLUCOSE 201 (H) 04/17/2014 1055   CALCIUM 8.3 (L) 04/20/2023 0707   CALCIUM 9.1 04/17/2014 1055   AST 28 04/17/2023 0558   AST 27 04/17/2014 1055   ALT 17 04/17/2023 0558   ALT 29 04/17/2014 1055   ALKPHOS 45 04/17/2023 0558   ALKPHOS 73 04/17/2014 1055   BILITOT 0.3 04/17/2023 0558   BILITOT 0.2 03/28/2020 1037   BILITOT 0.4 04/17/2014 1055   PROT 6.3 (L) 04/17/2023 0558   PROT 7.6 03/28/2020 1037   PROT 8.1 04/17/2014 1055   ALBUMIN 3.4 (L) 04/17/2023 0558   ALBUMIN 4.7 03/28/2020 1037   ALBUMIN 4.1 04/17/2014 1055    RADIOGRAPHIC STUDIES: VAS [name] ABI WITH/WO TBI  Result Date: 06/03/2023  LOWER EXTREMITY DOPPLER STUDY Patient Name:  Angela Jensen  Date of Exam:   06/03/2023 Medical Rec #: [id number]     Accession #:    [id number] Date of Birth: 1956-02-20     Patient Gender: F Patient Age:   47 years Exam Location:  [facility] Procedure:      VAS [name] ABI WITH/WO TBI Referring Phys: Barbara Cower DEW --------------------------------------------------------------------------------  Indications: Claudication, and peripheral artery disease. Other Factors: Left SFA stents are known  to be occluded.                Patient complains of constant numbness in left foot.  Vascular Interventions: 06/01/16: Left distal SFA/popliteal arteries PTAs with                         SFA stent x2;                         06/08/16: Left SFA PTA;                         09/29/18: Left SFA stent x2;. Performing Technologist: Hardie Lora RVT  Examination Guidelines: A complete evaluation includes at minimum, Doppler waveform signals and systolic blood pressure reading at the level of bilateral brachial, anterior tibial, and posterior tibial arteries, when vessel segments are accessible. Bilateral testing is considered an integral part of a complete examination. Photoelectric Plethysmograph (PPG) waveforms and toe systolic pressure readings are included as required and additional duplex testing as needed. Limited examinations for reoccurring indications may be performed as noted.  ABI Findings: +---------+------------------+-----+---------+--------+ Right    Rt Pressure (mmHg)IndexWaveform Comment  +---------+------------------+-----+---------+--------+ Brachial 123                                      +---------+------------------+-----+---------+--------+ PTA      101               0.82 biphasic          +---------+------------------+-----+---------+--------+  04/17/2014 1055   Comprehensive Metabolic Panel:    Component Value Date/Time   NA 138 04/20/2023 0707   NA 137 03/28/2020 1037   NA 141 04/17/2014 1055   K 4.0 04/20/2023 0707   K 3.9 04/17/2014 1055   CL 101 04/20/2023 0707   CL 102 04/17/2014 1055   CO2 31 04/20/2023 0707   CO2 30 04/17/2014 1055   BUN 18 04/20/2023 0707   BUN 10 03/28/2020 1037   BUN 10 04/17/2014 1055   CREATININE 0.55 04/20/2023 0707   CREATININE 0.90 04/17/2014 1055   GLUCOSE 203 (H) 04/20/2023 0707   GLUCOSE 201 (H) 04/17/2014 1055   CALCIUM 8.3 (L) 04/20/2023 0707   CALCIUM 9.1 04/17/2014 1055   AST 28 04/17/2023 0558   AST 27 04/17/2014 1055   ALT 17 04/17/2023 0558   ALT 29 04/17/2014 1055   ALKPHOS 45 04/17/2023 0558   ALKPHOS 73 04/17/2014 1055   BILITOT 0.3 04/17/2023 0558   BILITOT 0.2 03/28/2020 1037   BILITOT 0.4 04/17/2014 1055   PROT 6.3 (L) 04/17/2023 0558   PROT 7.6 03/28/2020 1037   PROT 8.1 04/17/2014 1055   ALBUMIN 3.4 (L) 04/17/2023 0558   ALBUMIN 4.7 03/28/2020 1037   ALBUMIN 4.1 04/17/2014 1055    RADIOGRAPHIC STUDIES: VAS [name] ABI WITH/WO TBI  Result Date: 06/03/2023  LOWER EXTREMITY DOPPLER STUDY Patient Name:  Angela Jensen  Date of Exam:   06/03/2023 Medical Rec #: [id number]     Accession #:    [id number] Date of Birth: 1956-02-20     Patient Gender: F Patient Age:   47 years Exam Location:  [facility] Procedure:      VAS [name] ABI WITH/WO TBI Referring Phys: Barbara Cower DEW --------------------------------------------------------------------------------  Indications: Claudication, and peripheral artery disease. Other Factors: Left SFA stents are known  to be occluded.                Patient complains of constant numbness in left foot.  Vascular Interventions: 06/01/16: Left distal SFA/popliteal arteries PTAs with                         SFA stent x2;                         06/08/16: Left SFA PTA;                         09/29/18: Left SFA stent x2;. Performing Technologist: Hardie Lora RVT  Examination Guidelines: A complete evaluation includes at minimum, Doppler waveform signals and systolic blood pressure reading at the level of bilateral brachial, anterior tibial, and posterior tibial arteries, when vessel segments are accessible. Bilateral testing is considered an integral part of a complete examination. Photoelectric Plethysmograph (PPG) waveforms and toe systolic pressure readings are included as required and additional duplex testing as needed. Limited examinations for reoccurring indications may be performed as noted.  ABI Findings: +---------+------------------+-----+---------+--------+ Right    Rt Pressure (mmHg)IndexWaveform Comment  +---------+------------------+-----+---------+--------+ Brachial 123                                      +---------+------------------+-----+---------+--------+ PTA      101               0.82 biphasic          +---------+------------------+-----+---------+--------+  04/17/2014 1055   Comprehensive Metabolic Panel:    Component Value Date/Time   NA 138 04/20/2023 0707   NA 137 03/28/2020 1037   NA 141 04/17/2014 1055   K 4.0 04/20/2023 0707   K 3.9 04/17/2014 1055   CL 101 04/20/2023 0707   CL 102 04/17/2014 1055   CO2 31 04/20/2023 0707   CO2 30 04/17/2014 1055   BUN 18 04/20/2023 0707   BUN 10 03/28/2020 1037   BUN 10 04/17/2014 1055   CREATININE 0.55 04/20/2023 0707   CREATININE 0.90 04/17/2014 1055   GLUCOSE 203 (H) 04/20/2023 0707   GLUCOSE 201 (H) 04/17/2014 1055   CALCIUM 8.3 (L) 04/20/2023 0707   CALCIUM 9.1 04/17/2014 1055   AST 28 04/17/2023 0558   AST 27 04/17/2014 1055   ALT 17 04/17/2023 0558   ALT 29 04/17/2014 1055   ALKPHOS 45 04/17/2023 0558   ALKPHOS 73 04/17/2014 1055   BILITOT 0.3 04/17/2023 0558   BILITOT 0.2 03/28/2020 1037   BILITOT 0.4 04/17/2014 1055   PROT 6.3 (L) 04/17/2023 0558   PROT 7.6 03/28/2020 1037   PROT 8.1 04/17/2014 1055   ALBUMIN 3.4 (L) 04/17/2023 0558   ALBUMIN 4.7 03/28/2020 1037   ALBUMIN 4.1 04/17/2014 1055    RADIOGRAPHIC STUDIES: VAS [name] ABI WITH/WO TBI  Result Date: 06/03/2023  LOWER EXTREMITY DOPPLER STUDY Patient Name:  Angela Jensen  Date of Exam:   06/03/2023 Medical Rec #: [id number]     Accession #:    [id number] Date of Birth: 1956-02-20     Patient Gender: F Patient Age:   47 years Exam Location:  [facility] Procedure:      VAS [name] ABI WITH/WO TBI Referring Phys: Barbara Cower DEW --------------------------------------------------------------------------------  Indications: Claudication, and peripheral artery disease. Other Factors: Left SFA stents are known  to be occluded.                Patient complains of constant numbness in left foot.  Vascular Interventions: 06/01/16: Left distal SFA/popliteal arteries PTAs with                         SFA stent x2;                         06/08/16: Left SFA PTA;                         09/29/18: Left SFA stent x2;. Performing Technologist: Hardie Lora RVT  Examination Guidelines: A complete evaluation includes at minimum, Doppler waveform signals and systolic blood pressure reading at the level of bilateral brachial, anterior tibial, and posterior tibial arteries, when vessel segments are accessible. Bilateral testing is considered an integral part of a complete examination. Photoelectric Plethysmograph (PPG) waveforms and toe systolic pressure readings are included as required and additional duplex testing as needed. Limited examinations for reoccurring indications may be performed as noted.  ABI Findings: +---------+------------------+-----+---------+--------+ Right    Rt Pressure (mmHg)IndexWaveform Comment  +---------+------------------+-----+---------+--------+ Brachial 123                                      +---------+------------------+-----+---------+--------+ PTA      101               0.82 biphasic          +---------+------------------+-----+---------+--------+  04/17/2014 1055   Comprehensive Metabolic Panel:    Component Value Date/Time   NA 138 04/20/2023 0707   NA 137 03/28/2020 1037   NA 141 04/17/2014 1055   K 4.0 04/20/2023 0707   K 3.9 04/17/2014 1055   CL 101 04/20/2023 0707   CL 102 04/17/2014 1055   CO2 31 04/20/2023 0707   CO2 30 04/17/2014 1055   BUN 18 04/20/2023 0707   BUN 10 03/28/2020 1037   BUN 10 04/17/2014 1055   CREATININE 0.55 04/20/2023 0707   CREATININE 0.90 04/17/2014 1055   GLUCOSE 203 (H) 04/20/2023 0707   GLUCOSE 201 (H) 04/17/2014 1055   CALCIUM 8.3 (L) 04/20/2023 0707   CALCIUM 9.1 04/17/2014 1055   AST 28 04/17/2023 0558   AST 27 04/17/2014 1055   ALT 17 04/17/2023 0558   ALT 29 04/17/2014 1055   ALKPHOS 45 04/17/2023 0558   ALKPHOS 73 04/17/2014 1055   BILITOT 0.3 04/17/2023 0558   BILITOT 0.2 03/28/2020 1037   BILITOT 0.4 04/17/2014 1055   PROT 6.3 (L) 04/17/2023 0558   PROT 7.6 03/28/2020 1037   PROT 8.1 04/17/2014 1055   ALBUMIN 3.4 (L) 04/17/2023 0558   ALBUMIN 4.7 03/28/2020 1037   ALBUMIN 4.1 04/17/2014 1055    RADIOGRAPHIC STUDIES: VAS [name] ABI WITH/WO TBI  Result Date: 06/03/2023  LOWER EXTREMITY DOPPLER STUDY Patient Name:  Angela Jensen  Date of Exam:   06/03/2023 Medical Rec #: [id number]     Accession #:    [id number] Date of Birth: 1956-02-20     Patient Gender: F Patient Age:   47 years Exam Location:  [facility] Procedure:      VAS [name] ABI WITH/WO TBI Referring Phys: Barbara Cower DEW --------------------------------------------------------------------------------  Indications: Claudication, and peripheral artery disease. Other Factors: Left SFA stents are known  to be occluded.                Patient complains of constant numbness in left foot.  Vascular Interventions: 06/01/16: Left distal SFA/popliteal arteries PTAs with                         SFA stent x2;                         06/08/16: Left SFA PTA;                         09/29/18: Left SFA stent x2;. Performing Technologist: Hardie Lora RVT  Examination Guidelines: A complete evaluation includes at minimum, Doppler waveform signals and systolic blood pressure reading at the level of bilateral brachial, anterior tibial, and posterior tibial arteries, when vessel segments are accessible. Bilateral testing is considered an integral part of a complete examination. Photoelectric Plethysmograph (PPG) waveforms and toe systolic pressure readings are included as required and additional duplex testing as needed. Limited examinations for reoccurring indications may be performed as noted.  ABI Findings: +---------+------------------+-----+---------+--------+ Right    Rt Pressure (mmHg)IndexWaveform Comment  +---------+------------------+-----+---------+--------+ Brachial 123                                      +---------+------------------+-----+---------+--------+ PTA      101               0.82 biphasic          +---------+------------------+-----+---------+--------+

## 2023-06-08 NOTE — Telephone Encounter (Signed)
Patient called reporting that for the past week she is feeling very weak and tired, She also has been having a throbbing headache lasting half the day every other day for which she takes her pain medicine to relieve. She also states when asked if she has any shortness of breath that she does have shortness of breath. Her next appointment is not until 10/13/23/Please advise

## 2023-06-09 ENCOUNTER — Encounter: Payer: Self-pay | Admitting: Hematology and Oncology

## 2023-06-09 ENCOUNTER — Telehealth: Payer: Self-pay

## 2023-06-09 NOTE — Telephone Encounter (Signed)
Called to inform patient of Xray results. No answer and VM not set up to leave VM. Per [employer], no changes to xray at this time. Patient advised to follow up with Pulmonology. Will call back later.

## 2023-06-09 NOTE — Telephone Encounter (Signed)
Spoke with patient and informed her of results and advised to follow up with Pulmonology. Patient verbalized understanding.

## 2023-06-09 NOTE — Telephone Encounter (Signed)
Pt called asking about blood resutls from last visit in August. Informed her that Dr. Cathie Hoops recommended venofer weekly x3 and scheduling attempted to call her multiple times but she did not answer and VM was not set up   please schedule and inform pt of appts:  Venofer weekly x3 Move up appts in Feb to Nov 2024. (labs 1-2 days prior to MD/venofer) MRI Brain- first available (she has not scheduled it yet)  She said to leave a VM if she does not answer. Told her to call back on Friday if she did not hear back with appt details.

## 2023-06-10 ENCOUNTER — Telehealth: Payer: Self-pay | Admitting: Oncology

## 2023-06-10 ENCOUNTER — Encounter: Payer: Self-pay | Admitting: Hematology and Oncology

## 2023-06-10 NOTE — Telephone Encounter (Signed)
Spoke with pt and informed of iron appt that was added and transferred call to centralized scheduling so she could schedule her MRI

## 2023-06-11 ENCOUNTER — Ambulatory Visit (INDEPENDENT_AMBULATORY_CARE_PROVIDER_SITE_OTHER): Payer: Medicare Other | Admitting: Nurse Practitioner

## 2023-06-14 ENCOUNTER — Other Ambulatory Visit: Payer: Self-pay

## 2023-06-14 ENCOUNTER — Emergency Department: Payer: 59

## 2023-06-14 ENCOUNTER — Inpatient Hospital Stay
Admission: EM | Admit: 2023-06-14 | Discharge: 2023-06-18 | DRG: 190 | Disposition: A | Discharge status: Home Health | Payer: 59 | Attending: Internal Medicine | Admitting: Internal Medicine

## 2023-06-14 DIAGNOSIS — J9622 Acute and chronic respiratory failure with hypercapnia: Secondary | ICD-10-CM | POA: Diagnosis not present

## 2023-06-14 DIAGNOSIS — F32A Depression, unspecified: Secondary | ICD-10-CM | POA: Diagnosis present

## 2023-06-14 DIAGNOSIS — Z1152 Encounter for screening for COVID-19: Secondary | ICD-10-CM | POA: Diagnosis not present

## 2023-06-14 DIAGNOSIS — R6889 Other general symptoms and signs: Secondary | ICD-10-CM | POA: Diagnosis not present

## 2023-06-14 DIAGNOSIS — E7800 Pure hypercholesterolemia, unspecified: Secondary | ICD-10-CM | POA: Diagnosis present

## 2023-06-14 DIAGNOSIS — J9621 Acute and chronic respiratory failure with hypoxia: Secondary | ICD-10-CM | POA: Diagnosis not present

## 2023-06-14 DIAGNOSIS — J441 Chronic obstructive pulmonary disease with (acute) exacerbation: Principal | ICD-10-CM

## 2023-06-14 DIAGNOSIS — E1165 Type 2 diabetes mellitus with hyperglycemia: Secondary | ICD-10-CM | POA: Diagnosis not present

## 2023-06-14 DIAGNOSIS — C3431 Malignant neoplasm of lower lobe, right bronchus or lung: Secondary | ICD-10-CM | POA: Diagnosis present

## 2023-06-14 DIAGNOSIS — E876 Hypokalemia: Secondary | ICD-10-CM | POA: Diagnosis present

## 2023-06-14 DIAGNOSIS — R0602 Shortness of breath: Secondary | ICD-10-CM | POA: Diagnosis not present

## 2023-06-14 DIAGNOSIS — Z85118 Personal history of other malignant neoplasm of bronchus and lung: Secondary | ICD-10-CM | POA: Diagnosis not present

## 2023-06-14 DIAGNOSIS — E8729 Other acidosis: Secondary | ICD-10-CM | POA: Diagnosis present

## 2023-06-14 DIAGNOSIS — R918 Other nonspecific abnormal finding of lung field: Secondary | ICD-10-CM | POA: Diagnosis not present

## 2023-06-14 DIAGNOSIS — I5033 Acute on chronic diastolic (congestive) heart failure: Secondary | ICD-10-CM | POA: Diagnosis present

## 2023-06-14 DIAGNOSIS — D509 Iron deficiency anemia, unspecified: Secondary | ICD-10-CM | POA: Diagnosis present

## 2023-06-14 DIAGNOSIS — I110 Hypertensive heart disease with heart failure: Secondary | ICD-10-CM | POA: Diagnosis present

## 2023-06-14 DIAGNOSIS — Z833 Family history of diabetes mellitus: Secondary | ICD-10-CM

## 2023-06-14 DIAGNOSIS — Z8249 Family history of ischemic heart disease and other diseases of the circulatory system: Secondary | ICD-10-CM | POA: Diagnosis not present

## 2023-06-14 DIAGNOSIS — J939 Pneumothorax, unspecified: Secondary | ICD-10-CM | POA: Diagnosis present

## 2023-06-14 DIAGNOSIS — Z923 Personal history of irradiation: Secondary | ICD-10-CM

## 2023-06-14 DIAGNOSIS — D649 Anemia, unspecified: Secondary | ICD-10-CM | POA: Diagnosis present

## 2023-06-14 DIAGNOSIS — F419 Anxiety disorder, unspecified: Secondary | ICD-10-CM | POA: Diagnosis present

## 2023-06-14 DIAGNOSIS — Z7951 Long term (current) use of inhaled steroids: Secondary | ICD-10-CM

## 2023-06-14 DIAGNOSIS — E663 Overweight: Secondary | ICD-10-CM | POA: Diagnosis present

## 2023-06-14 DIAGNOSIS — R0689 Other abnormalities of breathing: Secondary | ICD-10-CM | POA: Diagnosis not present

## 2023-06-14 DIAGNOSIS — I10 Essential (primary) hypertension: Secondary | ICD-10-CM | POA: Diagnosis present

## 2023-06-14 DIAGNOSIS — Z801 Family history of malignant neoplasm of trachea, bronchus and lung: Secondary | ICD-10-CM

## 2023-06-14 DIAGNOSIS — Z743 Need for continuous supervision: Secondary | ICD-10-CM | POA: Diagnosis not present

## 2023-06-14 DIAGNOSIS — Z83438 Family history of other disorder of lipoprotein metabolism and other lipidemia: Secondary | ICD-10-CM

## 2023-06-14 DIAGNOSIS — Z9981 Dependence on supplemental oxygen: Secondary | ICD-10-CM

## 2023-06-14 DIAGNOSIS — D539 Nutritional anemia, unspecified: Secondary | ICD-10-CM | POA: Diagnosis present

## 2023-06-14 DIAGNOSIS — Z6826 Body mass index (BMI) 26.0-26.9, adult: Secondary | ICD-10-CM

## 2023-06-14 DIAGNOSIS — Z87891 Personal history of nicotine dependence: Secondary | ICD-10-CM

## 2023-06-14 DIAGNOSIS — E119 Type 2 diabetes mellitus without complications: Secondary | ICD-10-CM

## 2023-06-14 DIAGNOSIS — Z888 Allergy status to other drugs, medicaments and biological substances status: Secondary | ICD-10-CM

## 2023-06-14 DIAGNOSIS — Z7902 Long term (current) use of antithrombotics/antiplatelets: Secondary | ICD-10-CM

## 2023-06-14 DIAGNOSIS — E1151 Type 2 diabetes mellitus with diabetic peripheral angiopathy without gangrene: Secondary | ICD-10-CM | POA: Diagnosis not present

## 2023-06-14 DIAGNOSIS — Z79899 Other long term (current) drug therapy: Secondary | ICD-10-CM

## 2023-06-14 DIAGNOSIS — E11649 Type 2 diabetes mellitus with hypoglycemia without coma: Secondary | ICD-10-CM | POA: Diagnosis not present

## 2023-06-14 DIAGNOSIS — K761 Chronic passive congestion of liver: Secondary | ICD-10-CM | POA: Diagnosis not present

## 2023-06-14 DIAGNOSIS — R7401 Elevation of levels of liver transaminase levels: Secondary | ICD-10-CM | POA: Diagnosis present

## 2023-06-14 DIAGNOSIS — Z7984 Long term (current) use of oral hypoglycemic drugs: Secondary | ICD-10-CM

## 2023-06-14 DIAGNOSIS — J439 Emphysema, unspecified: Secondary | ICD-10-CM | POA: Diagnosis present

## 2023-06-14 DIAGNOSIS — R069 Unspecified abnormalities of breathing: Secondary | ICD-10-CM | POA: Diagnosis not present

## 2023-06-14 DIAGNOSIS — I4510 Unspecified right bundle-branch block: Secondary | ICD-10-CM | POA: Diagnosis not present

## 2023-06-14 DIAGNOSIS — I739 Peripheral vascular disease, unspecified: Secondary | ICD-10-CM | POA: Diagnosis present

## 2023-06-14 MED ORDER — IPRATROPIUM-ALBUTEROL 0.5-2.5 (3) MG/3ML IN SOLN
6.0000 mL | Freq: Once | RESPIRATORY_TRACT | Status: AC
  Administered 2023-06-15: 6 mL via RESPIRATORY_TRACT
  Filled 2023-06-14: qty 6

## 2023-06-14 NOTE — ED Provider Notes (Signed)
[facility] Provider Note    Event Date/Time   First MD Initiated Contact with Patient 06/14/23 2346     (approximate)   History   Respiratory Distress   HPI  Angela Jensen is a 67 y.o. female who presents to the ED for evaluation of Respiratory Distress   I reviewed medical DC summary from 2 months ago.  COPD/CHF with chronic respiratory failure is on 2-4 L at baseline.  Lung cancer s/p radiation.  She had a pneumothorax on this recent admission requiring chest tube placement.  Patient presents to the ED for evaluation of shortness of breath.  Some mild right-sided chest pain but no fevers or increased sputum production.  No syncope.  Physical Exam   Triage Vital Signs: ED Triage Vitals  Encounter Vitals Group     BP 06/14/23 2356 (!) 147/89     Systolic BP Percentile --      Diastolic BP Percentile --      Pulse Rate 06/14/23 2348 100     Resp 06/14/23 2354 15     Temp 06/14/23 2348 97.9 F (36.6 C)     Temp src --      SpO2 06/14/23 2348 95 %     Weight --      Height --      Head Circumference --      Peak Flow --      Pain Score 06/14/23 2349 8     Pain Loc --      Pain Education --      Exclude from Growth Chart --     Most recent vital signs: Vitals:   06/15/23 0130 06/15/23 0136  BP: (!) 141/81   Pulse: 95 94  Resp: 11 16  Temp:    SpO2: 95% 95%    General: Awake  Tripoding, clearly tachypneic, using accessory muscles CV:  Good peripheral perfusion.  Resp:  Distress and tripoding.  Very poor airflow with faint wheezing throughout Abd:  No distention.  MSK:  No deformity noted.  Neuro:  No focal deficits appreciated. Other:     ED Results / Procedures / Treatments   Labs (all labs ordered are listed, but only abnormal results are displayed) Labs Reviewed  BLOOD GAS, ARTERIAL - Abnormal; Notable for the following components:      Result Value   pCO2 arterial 55 (*)    pO2, Arterial 46 (*)    Bicarbonate 33.3 (*)     Acid-Base Excess 6.7 (*)    All other components within normal limits  COMPREHENSIVE METABOLIC PANEL - Abnormal; Notable for the following components:   Glucose, Bld 144 (*)    Calcium 8.5 (*)    Total Protein 6.4 (*)    AST 95 (*)    ALT 67 (*)    All other components within normal limits  CBC WITH DIFFERENTIAL/PLATELET - Abnormal; Notable for the following components:   RBC 2.57 (*)    Hemoglobin 7.4 (*)    HCT 27.0 (*)    MCV 105.1 (*)    MCHC 27.4 (*)    RDW 15.6 (*)    All other components within normal limits  BRAIN NATRIURETIC PEPTIDE - Abnormal; Notable for the following components:   B Natriuretic Peptide 126.9 (*)    All other components within normal limits  SARS CORONAVIRUS 2 BY RT PCR  MAGNESIUM  TROPONIN I (HIGH SENSITIVITY)  TROPONIN I (HIGH SENSITIVITY)    EKG Sinus rhythm with  a rate of 93 bpm.  Right bundle.  No STEMI.  This.  RADIOLOGY 1 view CXR interpreted me with chronic RLL opacities without clear acute superimposed infection or pneumothorax  Official radiology report(s): No results found.  PROCEDURES and INTERVENTIONS:  .1-3 Lead EKG Interpretation  Performed by: Delton Prairie, MD Authorized by: Delton Prairie, MD     Interpretation: normal     ECG rate:  85   ECG rate assessment: normal     Rhythm: sinus rhythm     Ectopy: none     Conduction: normal   .Critical Care  Performed by: Delton Prairie, MD Authorized by: Delton Prairie, MD   Critical care provider statement:    Critical care time (minutes):  30   Critical care time was exclusive of:  Separately billable procedures and treating other patients   Critical care was necessary to treat or prevent imminent or life-threatening deterioration of the following conditions:  Respiratory failure   Critical care was time spent personally by me on the following activities:  Development of treatment plan with patient or surrogate, discussions with consultants, evaluation of patient's response to  treatment, examination of patient, ordering and review of laboratory studies, ordering and review of radiographic studies, ordering and performing treatments and interventions, pulse oximetry, re-evaluation of patient's condition and review of old charts   Medications  furosemide (LASIX) injection 40 mg (has no administration in time range)  ipratropium-albuterol (DUONEB) 0.5-2.5 (3) MG/3ML nebulizer solution 6 mL (6 mLs Nebulization Given 06/15/23 0003)     IMPRESSION / MDM / ASSESSMENT AND PLAN / ED COURSE  I reviewed the triage vital signs and the nursing notes.  Differential diagnosis includes, but is not limited to, ACS, PTX, PNA, muscle strain/spasm, PE, dissection, anxiety, pleural effusion, COPD exacerbation  {Patient presents with symptoms of an acute illness or injury that is potentially life-threatening.  Patient presents to the ED with acute hypoxic respiratory failure with evidence of a COPD exacerbation requiring BiPAP and medical admission.  Tripoding and looking unwell on arrival.  No STEMI troponins are low but we will trend these.  BNP is mildly elevated , and this is not often the case when she is admitted so I suspect a degree of CHF exacerbation as well.  We will provide a small dose of Lasix.  I consult medicine who agrees to admit.  Clinical Course as of 06/15/23 0140  Tue Jun 15, 2023  0015 Reassessed. Gas noted. Likely a venous sample [DS]  0116 Reassessed. Daughters at the bedside.  Looks well on the BiPAP, respiratory rate is slowed to about 10 and she looks comfortable.  We we will have respiratory come by and try to get her off of the BiPAP and stepdown to nasal cannula [DS]    Clinical Course User Index [DS] Delton Prairie, MD     FINAL CLINICAL IMPRESSION(S) / ED DIAGNOSES   Final diagnoses:  None     Rx / DC Orders   ED Discharge Orders     None        Note:  This document was prepared using Dragon voice recognition software and may include  unintentional dictation errors.   Delton Prairie, MD 06/15/23 [phone]

## 2023-06-14 NOTE — ED Triage Notes (Signed)
Pt arrives EMS from home with resp distress. Pt has been [facility] for the last 2-3 days and hypoxic in the 80's on home 3L with hx of COPD. Pt arrives on CPAP. EMS gave 125 solumedrol, 1 duo neb and 1 albuterol.

## 2023-06-15 ENCOUNTER — Emergency Department: Payer: 59

## 2023-06-15 ENCOUNTER — Encounter: Payer: Self-pay | Admitting: Internal Medicine

## 2023-06-15 ENCOUNTER — Inpatient Hospital Stay (HOSPITAL_COMMUNITY)
Admit: 2023-06-15 | Discharge: 2023-06-15 | Disposition: A | Discharge status: Home / Self Care | Payer: 59 | Attending: Internal Medicine | Admitting: Internal Medicine

## 2023-06-15 DIAGNOSIS — J441 Chronic obstructive pulmonary disease with (acute) exacerbation: Secondary | ICD-10-CM | POA: Diagnosis not present

## 2023-06-15 DIAGNOSIS — I110 Hypertensive heart disease with heart failure: Secondary | ICD-10-CM | POA: Diagnosis present

## 2023-06-15 DIAGNOSIS — F32A Depression, unspecified: Secondary | ICD-10-CM | POA: Diagnosis present

## 2023-06-15 DIAGNOSIS — Z1152 Encounter for screening for COVID-19: Secondary | ICD-10-CM | POA: Diagnosis not present

## 2023-06-15 DIAGNOSIS — J439 Emphysema, unspecified: Secondary | ICD-10-CM | POA: Diagnosis present

## 2023-06-15 DIAGNOSIS — Z7984 Long term (current) use of oral hypoglycemic drugs: Secondary | ICD-10-CM | POA: Diagnosis not present

## 2023-06-15 DIAGNOSIS — E876 Hypokalemia: Secondary | ICD-10-CM | POA: Diagnosis present

## 2023-06-15 DIAGNOSIS — J9621 Acute and chronic respiratory failure with hypoxia: Secondary | ICD-10-CM | POA: Diagnosis present

## 2023-06-15 DIAGNOSIS — D509 Iron deficiency anemia, unspecified: Secondary | ICD-10-CM

## 2023-06-15 DIAGNOSIS — E1165 Type 2 diabetes mellitus with hyperglycemia: Secondary | ICD-10-CM | POA: Diagnosis present

## 2023-06-15 DIAGNOSIS — F419 Anxiety disorder, unspecified: Secondary | ICD-10-CM | POA: Diagnosis present

## 2023-06-15 DIAGNOSIS — I739 Peripheral vascular disease, unspecified: Secondary | ICD-10-CM

## 2023-06-15 DIAGNOSIS — Z85118 Personal history of other malignant neoplasm of bronchus and lung: Secondary | ICD-10-CM | POA: Diagnosis not present

## 2023-06-15 DIAGNOSIS — I5033 Acute on chronic diastolic (congestive) heart failure: Secondary | ICD-10-CM | POA: Diagnosis not present

## 2023-06-15 DIAGNOSIS — Z9981 Dependence on supplemental oxygen: Secondary | ICD-10-CM | POA: Diagnosis not present

## 2023-06-15 DIAGNOSIS — Z8249 Family history of ischemic heart disease and other diseases of the circulatory system: Secondary | ICD-10-CM | POA: Diagnosis not present

## 2023-06-15 DIAGNOSIS — E11649 Type 2 diabetes mellitus with hypoglycemia without coma: Secondary | ICD-10-CM | POA: Diagnosis not present

## 2023-06-15 DIAGNOSIS — D508 Other iron deficiency anemias: Secondary | ICD-10-CM

## 2023-06-15 DIAGNOSIS — J939 Pneumothorax, unspecified: Secondary | ICD-10-CM

## 2023-06-15 DIAGNOSIS — E8729 Other acidosis: Secondary | ICD-10-CM | POA: Diagnosis present

## 2023-06-15 DIAGNOSIS — E7800 Pure hypercholesterolemia, unspecified: Secondary | ICD-10-CM | POA: Diagnosis present

## 2023-06-15 DIAGNOSIS — R7401 Elevation of levels of liver transaminase levels: Secondary | ICD-10-CM

## 2023-06-15 DIAGNOSIS — D539 Nutritional anemia, unspecified: Secondary | ICD-10-CM | POA: Diagnosis present

## 2023-06-15 DIAGNOSIS — E119 Type 2 diabetes mellitus without complications: Secondary | ICD-10-CM

## 2023-06-15 DIAGNOSIS — E663 Overweight: Secondary | ICD-10-CM | POA: Insufficient documentation

## 2023-06-15 DIAGNOSIS — C3431 Malignant neoplasm of lower lobe, right bronchus or lung: Secondary | ICD-10-CM

## 2023-06-15 DIAGNOSIS — J9622 Acute and chronic respiratory failure with hypercapnia: Secondary | ICD-10-CM | POA: Diagnosis present

## 2023-06-15 DIAGNOSIS — E1151 Type 2 diabetes mellitus with diabetic peripheral angiopathy without gangrene: Secondary | ICD-10-CM | POA: Diagnosis present

## 2023-06-15 DIAGNOSIS — K761 Chronic passive congestion of liver: Secondary | ICD-10-CM | POA: Diagnosis present

## 2023-06-15 DIAGNOSIS — I10 Essential (primary) hypertension: Secondary | ICD-10-CM

## 2023-06-15 DIAGNOSIS — Z923 Personal history of irradiation: Secondary | ICD-10-CM | POA: Diagnosis not present

## 2023-06-15 LAB — ECHOCARDIOGRAM COMPLETE
AR max vel: 1.7 cm2
AV Area VTI: 1.64 cm2
AV Area mean vel: 1.61 cm2
AV Mean grad: 3 mm[Hg]
AV Peak grad: 5.5 mm[Hg]
Ao pk vel: 1.17 m/s
Area-P 1/2: 4.89 cm2
Est EF: >55
Height: 59 in
MV VTI: 2 cm2
S' Lateral: 2.8 cm
Weight: 2064 [oz_av]

## 2023-06-15 LAB — CBC WITH DIFFERENTIAL/PLATELET
Abs Immature Granulocytes: 0.01 10*3/uL (ref 0.00–0.07)
Basophils Absolute: 0 10*3/uL (ref 0.0–0.1)
Basophils Relative: 0 %
Eosinophils Absolute: 0.2 10*3/uL (ref 0.0–0.5)
Eosinophils Relative: 3 %
HCT: 27 % — ABNORMAL LOW (ref 36.0–46.0)
Hemoglobin: 7.4 g/dL — ABNORMAL LOW (ref 12.0–15.0)
Immature Granulocytes: 0 %
Lymphocytes Relative: 21 %
Lymphs Abs: 1.2 10*3/uL (ref 0.7–4.0)
MCH: 28.8 pg (ref 26.0–34.0)
MCHC: 27.4 g/dL — ABNORMAL LOW (ref 30.0–36.0)
MCV: 105.1 fL — ABNORMAL HIGH (ref 80.0–100.0)
Monocytes Absolute: 0.8 10*3/uL (ref 0.1–1.0)
Monocytes Relative: 13 %
Neutro Abs: 3.5 10*3/uL (ref 1.7–7.7)
Neutrophils Relative %: 63 %
Platelets: 331 10*3/uL (ref 150–400)
RBC: 2.57 MIL/uL — ABNORMAL LOW (ref 3.87–5.11)
RDW: 15.6 % — ABNORMAL HIGH (ref 11.5–15.5)
WBC: 5.7 10*3/uL (ref 4.0–10.5)
nRBC: 0 % (ref 0.0–0.2)

## 2023-06-15 LAB — COMPREHENSIVE METABOLIC PANEL
ALT: 67 U/L — ABNORMAL HIGH (ref 0–44)
AST: 95 U/L — ABNORMAL HIGH (ref 15–41)
Albumin: 3.6 g/dL (ref 3.5–5.0)
Alkaline Phosphatase: 68 U/L (ref 38–126)
Anion gap: 8 (ref 5–15)
BUN: 14 mg/dL (ref 8–23)
CO2: 29 mmol/L (ref 22–32)
Calcium: 8.5 mg/dL — ABNORMAL LOW (ref 8.9–10.3)
Chloride: 103 mmol/L (ref 98–111)
Creatinine, Ser: 0.54 mg/dL (ref 0.44–1.00)
GFR, Estimated: >60 mL/min (ref >60)
Glucose, Bld: 144 mg/dL — ABNORMAL HIGH (ref 70–99)
Potassium: 3.8 mmol/L (ref 3.5–5.1)
Sodium: 140 mmol/L (ref 135–145)
Total Bilirubin: 0.4 mg/dL (ref 0.3–1.2)
Total Protein: 6.4 g/dL — ABNORMAL LOW (ref 6.5–8.1)

## 2023-06-15 LAB — BLOOD GAS, ARTERIAL
Acid-Base Excess: 6.7 mmol/L — ABNORMAL HIGH (ref 0.0–2.0)
Bicarbonate: 33.3 mmol/L — ABNORMAL HIGH (ref 20.0–28.0)
Delivery systems: POSITIVE
FIO2: 35 %
O2 Saturation: 75.8 %
Patient temperature: 37
pCO2 arterial: 55 mm[Hg] — ABNORMAL HIGH (ref 32–48)
pH, Arterial: 7.39 (ref 7.35–7.45)
pO2, Arterial: 46 mm[Hg] — ABNORMAL LOW (ref 83–108)

## 2023-06-15 LAB — MAGNESIUM: Magnesium: 2 mg/dL (ref 1.7–2.4)

## 2023-06-15 LAB — IRON AND TIBC
Iron: 24 ug/dL — ABNORMAL LOW (ref 28–170)
Saturation Ratios: 7 % — ABNORMAL LOW (ref 10.4–31.8)
TIBC: 368 ug/dL (ref 250–450)
UIBC: 344 ug/dL

## 2023-06-15 LAB — RETICULOCYTES
Immature Retic Fract: 30.3 % — ABNORMAL HIGH (ref 2.3–15.9)
RBC.: 2.51 MIL/uL — ABNORMAL LOW (ref 3.87–5.11)
Retic Count, Absolute: 189.8 10*3/uL — ABNORMAL HIGH (ref 19.0–186.0)
Retic Ct Pct: 7.6 % — ABNORMAL HIGH (ref 0.4–3.1)

## 2023-06-15 LAB — GLUCOSE, CAPILLARY: Glucose-Capillary: 213 mg/dL — ABNORMAL HIGH (ref 70–99)

## 2023-06-15 LAB — CBG MONITORING, ED
Glucose-Capillary: 207 mg/dL — ABNORMAL HIGH (ref 70–99)
Glucose-Capillary: 143 mg/dL — ABNORMAL HIGH (ref 70–99)
Glucose-Capillary: 123 mg/dL — ABNORMAL HIGH (ref 70–99)

## 2023-06-15 LAB — BRAIN NATRIURETIC PEPTIDE: B Natriuretic Peptide: 126.9 pg/mL — ABNORMAL HIGH (ref 0.0–100.0)

## 2023-06-15 LAB — TROPONIN I (HIGH SENSITIVITY)
Troponin I (High Sensitivity): 10 ng/L (ref <18)
Troponin I (High Sensitivity): 9 ng/L (ref <18)

## 2023-06-15 LAB — FERRITIN: Ferritin: 22 ng/mL (ref 11–307)

## 2023-06-15 LAB — VITAMIN B12: Vitamin B-12: 383 pg/mL (ref 180–914)

## 2023-06-15 LAB — FOLATE: Folate: 11.2 ng/mL (ref >5.9)

## 2023-06-15 LAB — PREPARE RBC (CROSSMATCH)

## 2023-06-15 LAB — SARS CORONAVIRUS 2 BY RT PCR: SARS Coronavirus 2 by RT PCR: NEGATIVE

## 2023-06-15 MED ORDER — OMEGA-3-ACID ETHYL ESTERS 1 G PO CAPS
1.0000 g | ORAL_CAPSULE | Freq: Every day | ORAL | Status: DC
  Administered 2023-06-15 – 2023-06-17 (×2): 1 g via ORAL
  Filled 2023-06-15 (×4): qty 1

## 2023-06-15 MED ORDER — INSULIN ASPART 100 UNIT/ML IJ SOLN
0.0000 [IU] | Freq: Every day | INTRAMUSCULAR | Status: DC
  Administered 2023-06-15 – 2023-06-16 (×2): 2 [IU] via SUBCUTANEOUS
  Filled 2023-06-15 (×2): qty 1

## 2023-06-15 MED ORDER — INSULIN DETEMIR 100 UNIT/ML ~~LOC~~ SOLN
15.0000 [IU] | Freq: Every day | SUBCUTANEOUS | Status: DC
  Administered 2023-06-15 – 2023-06-17 (×3): 15 [IU] via SUBCUTANEOUS
  Filled 2023-06-15 (×3): qty 0.15

## 2023-06-15 MED ORDER — INSULIN ASPART 100 UNIT/ML IJ SOLN
0.0000 [IU] | Freq: Three times a day (TID) | INTRAMUSCULAR | Status: DC
  Administered 2023-06-15: 3 [IU] via SUBCUTANEOUS
  Administered 2023-06-15 (×2): 1 [IU] via SUBCUTANEOUS
  Administered 2023-06-16 (×2): 2 [IU] via SUBCUTANEOUS
  Administered 2023-06-16: 1 [IU] via SUBCUTANEOUS
  Administered 2023-06-17: 2 [IU] via SUBCUTANEOUS
  Administered 2023-06-17: 3 [IU] via SUBCUTANEOUS
  Administered 2023-06-18: 2 [IU] via SUBCUTANEOUS
  Filled 2023-06-15 (×9): qty 1

## 2023-06-15 MED ORDER — ONDANSETRON HCL 4 MG PO TABS: 4.0000 mg | ORAL_TABLET | Freq: Four times a day (QID) | ORAL | Status: DC | PRN

## 2023-06-15 MED ORDER — FUROSEMIDE 10 MG/ML IJ SOLN
40.0000 mg | Freq: Once | INTRAMUSCULAR | Status: AC
  Administered 2023-06-15: 40 mg via INTRAVENOUS
  Filled 2023-06-15: qty 4

## 2023-06-15 MED ORDER — PREDNISONE 20 MG PO TABS
40.0000 mg | ORAL_TABLET | Freq: Every day | ORAL | Status: DC
  Administered 2023-06-16 – 2023-06-18 (×3): 40 mg via ORAL
  Filled 2023-06-15 (×3): qty 2

## 2023-06-15 MED ORDER — METOPROLOL SUCCINATE ER 25 MG PO TB24
25.0000 mg | ORAL_TABLET | Freq: Every day | ORAL | Status: DC
  Administered 2023-06-15 – 2023-06-18 (×4): 25 mg via ORAL
  Filled 2023-06-15 (×4): qty 1

## 2023-06-15 MED ORDER — ACETAMINOPHEN 650 MG RE SUPP: 650.0000 mg | Freq: Four times a day (QID) | RECTAL | Status: DC | PRN

## 2023-06-15 MED ORDER — ACETAMINOPHEN 325 MG PO TABS
650.0000 mg | ORAL_TABLET | Freq: Four times a day (QID) | ORAL | Status: DC | PRN
  Administered 2023-06-15 (×2): 650 mg via ORAL
  Filled 2023-06-15 (×3): qty 2

## 2023-06-15 MED ORDER — FUROSEMIDE 20 MG PO TABS
20.0000 mg | ORAL_TABLET | Freq: Every day | ORAL | Status: DC
  Administered 2023-06-15 – 2023-06-18 (×4): 20 mg via ORAL
  Filled 2023-06-15 (×4): qty 1

## 2023-06-15 MED ORDER — ACETAMINOPHEN 325 MG PO TABS
650.0000 mg | ORAL_TABLET | Freq: Once | ORAL | Status: AC
  Administered 2023-06-15: 650 mg via ORAL
  Filled 2023-06-15: qty 2

## 2023-06-15 MED ORDER — FUROSEMIDE 10 MG/ML IJ SOLN
20.0000 mg | Freq: Once | INTRAMUSCULAR | Status: AC
  Administered 2023-06-15: 20 mg via INTRAVENOUS
  Filled 2023-06-15: qty 4

## 2023-06-15 MED ORDER — CLOPIDOGREL BISULFATE 75 MG PO TABS
75.0000 mg | ORAL_TABLET | Freq: Every day | ORAL | Status: DC
  Administered 2023-06-15 – 2023-06-18 (×4): 75 mg via ORAL
  Filled 2023-06-15 (×4): qty 1

## 2023-06-15 MED ORDER — METHYLPREDNISOLONE SODIUM SUCC 40 MG IJ SOLR
40.0000 mg | Freq: Two times a day (BID) | INTRAMUSCULAR | Status: AC
  Administered 2023-06-15 (×2): 40 mg via INTRAVENOUS
  Filled 2023-06-15 (×2): qty 1

## 2023-06-15 MED ORDER — ENOXAPARIN SODIUM 40 MG/0.4ML IJ SOSY
40.0000 mg | PREFILLED_SYRINGE | INTRAMUSCULAR | Status: DC
  Administered 2023-06-15 – 2023-06-18 (×4): 40 mg via SUBCUTANEOUS
  Filled 2023-06-15 (×4): qty 0.4

## 2023-06-15 MED ORDER — GABAPENTIN 300 MG PO CAPS
300.0000 mg | ORAL_CAPSULE | Freq: Three times a day (TID) | ORAL | Status: DC
  Administered 2023-06-15 – 2023-06-18 (×10): 300 mg via ORAL
  Filled 2023-06-15 (×10): qty 1

## 2023-06-15 MED ORDER — ALPRAZOLAM 0.25 MG PO TABS
0.2500 mg | ORAL_TABLET | Freq: Two times a day (BID) | ORAL | Status: DC | PRN
  Administered 2023-06-16 – 2023-06-18 (×3): 0.25 mg via ORAL
  Filled 2023-06-15 (×3): qty 1

## 2023-06-15 MED ORDER — ALBUTEROL SULFATE (2.5 MG/3ML) 0.083% IN NEBU: 2.5000 mg | INHALATION_SOLUTION | RESPIRATORY_TRACT | Status: DC | PRN

## 2023-06-15 MED ORDER — ESCITALOPRAM OXALATE 10 MG PO TABS
10.0000 mg | ORAL_TABLET | Freq: Every day | ORAL | Status: DC
  Administered 2023-06-15 – 2023-06-18 (×4): 10 mg via ORAL
  Filled 2023-06-15 (×4): qty 1

## 2023-06-15 MED ORDER — SODIUM CHLORIDE 0.9% IV SOLUTION
Freq: Once | INTRAVENOUS | Status: AC
Start: 2023-06-15 — End: 2023-06-15
  Filled 2023-06-15: qty 250

## 2023-06-15 MED ORDER — HYDROCODONE-ACETAMINOPHEN 5-325 MG PO TABS
1.0000 | ORAL_TABLET | ORAL | Status: DC | PRN
  Administered 2023-06-15: 1 via ORAL
  Administered 2023-06-16 – 2023-06-18 (×4): 2 via ORAL
  Filled 2023-06-15 (×4): qty 2
  Filled 2023-06-15: qty 1

## 2023-06-15 MED ORDER — FERROUS SULFATE 325 (65 FE) MG PO TABS
325.0000 mg | ORAL_TABLET | Freq: Two times a day (BID) | ORAL | Status: DC
  Administered 2023-06-15 – 2023-06-17 (×5): 325 mg via ORAL
  Filled 2023-06-15 (×5): qty 1

## 2023-06-15 MED ORDER — ONDANSETRON HCL 4 MG/2ML IJ SOLN: 4.0000 mg | Freq: Four times a day (QID) | INTRAMUSCULAR | Status: DC | PRN

## 2023-06-15 MED ORDER — IPRATROPIUM-ALBUTEROL 0.5-2.5 (3) MG/3ML IN SOLN
3.0000 mL | Freq: Four times a day (QID) | RESPIRATORY_TRACT | Status: DC
  Administered 2023-06-15 – 2023-06-17 (×10): 3 mL via RESPIRATORY_TRACT
  Filled 2023-06-15 (×10): qty 3

## 2023-06-15 MED ORDER — MORPHINE SULFATE (PF) 2 MG/ML IV SOLN: 2.0000 mg | INTRAVENOUS | Status: DC | PRN

## 2023-06-15 MED ORDER — IPRATROPIUM-ALBUTEROL 0.5-2.5 (3) MG/3ML IN SOLN
3.0000 mL | Freq: Once | RESPIRATORY_TRACT | Status: AC
  Administered 2023-06-15: 3 mL via RESPIRATORY_TRACT
  Filled 2023-06-15: qty 3

## 2023-06-15 NOTE — Assessment & Plan Note (Addendum)
Continue Plavix.  ?

## 2023-06-15 NOTE — Assessment & Plan Note (Addendum)
With EMS had a pulse ox in the 80s.  Initially required BiPAP for respiratory distress on presentation.  With high pCO2, will see if we can get noninvasive ventilation approved for home.  Likely secondary to COPD exacerbation.  Continue Solu-Medrol today and hopefully switch over to prednisone for tomorrow.  Continue nebulizer treatments.

## 2023-06-15 NOTE — Assessment & Plan Note (Addendum)
Suspect related to hepatic congestion

## 2023-06-15 NOTE — Progress Notes (Signed)
*  PRELIMINARY RESULTS* Echocardiogram 2D Echocardiogram has been performed.  Carolyne Fiscal 06/15/2023, 12:28 PM

## 2023-06-15 NOTE — Assessment & Plan Note (Addendum)
More likely secondary to COPD.  Will give 1 dose of Lasix with blood transfusion.

## 2023-06-15 NOTE — Assessment & Plan Note (Addendum)
-   Continue Toprol 

## 2023-06-15 NOTE — ED Notes (Signed)
RT contacted to attempt to trial pt off of Bipap

## 2023-06-15 NOTE — Assessment & Plan Note (Signed)
Continue Lexapro and Xanax

## 2023-06-15 NOTE — Assessment & Plan Note (Deleted)
Hemoglobin at baseline at 7.4

## 2023-06-15 NOTE — Assessment & Plan Note (Signed)
BMI 26.05

## 2023-06-15 NOTE — ED Notes (Signed)
Pt moved from room 3 to room 38 via stretcher, pt in no acute distress at this time and states that her breathing is better as long as she isn't moving too much, pt states that she is always on 3.5L of O2 per [place], pt's family member at bedside and carried pt's belongings bag with her. Pt reconnected to the [place]wick

## 2023-06-15 NOTE — H&P (Signed)
History and Physical    Patient: Angela Jensen ZOX:[id number] DOB: April 09, 1956 DOA: 06/14/2023 DOS: the patient was seen and examined on 06/15/2023 PCP: Sallyanne Kuster, NP  Patient coming from: Home  Chief Complaint:  Chief Complaint  Patient presents with   Respiratory Distress    HPI: Angela Jensen is a 67 y.o. female with medical history significant for COPD on 4 L nasal cannula at baseline, right lung cancer, s/p radiation therapy, recurrent right pneumothorax x 2 most recently in August 2024  as well as history of CHF (EF 50% 04/2022), HTN, DM and PAD who presents to the ED in respiratory distress, tripoding, requiring CPAP for transport with EMS.  She was reportedly hypoxic to the 80s on her home flow rate of 3 L with EMS.  She received Solu-Medrol, DuoNebs en route.  On arrival she was transitioned to BiPAP. ED course and dataReview: Tachycardic to 100, O2 sat 95% on BiPAP but otherwise normal vitals ABG on BiPAP with pH 7.39, pCO2 55 and pO2 46 Troponin 9 and BNP 126 Hemoglobin 7.4 which is her baseline CMP unremarkable except for mildly elevated transaminases with AST 95 and ALT 67 COVID-negative EKG, sinus rhythm at 95 with no acute ST-T wave changes Chest x-ray personally read and interpreted pending formal read: Increased pulmonary vascularity.  No pneumothorax noted. Patient treated with an additional DuoNeb and given a dose of IV Lasix. Hospitalist consulted for admission.     Past Medical History:  Diagnosis Date   Anemia    Arthritis    Asthma    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/2016   Cancer (HCC) 2012   Right Lung CA   COPD (chronic obstructive pulmonary disease) (HCC)    Depression    Diabetes mellitus without complication (HCC)    Patient takes Janumet   Essential hypertension 05/26/2016   Heart failure (HCC) 2022   Hydropneumothorax 05/03/2020   Hypercholesteremia    Oxygen dependent    2L at  nite    PAD (peripheral artery disease) (HCC) 06/22/2016   Peripheral vascular disease (HCC)    Personal history of radiation therapy    Shortness of breath dyspnea    with exertion    Sialolithiasis    Sleep apnea    Wears dentures    full upper and lower   Past Surgical History:  Procedure Laterality Date   CESAREAN SECTION     x3   COLONOSCOPY WITH PROPOFOL N/A 06/25/2015   Procedure: COLONOSCOPY WITH PROPOFOL;  Surgeon: Midge Minium, MD;  Location: [facility];  Service: Endoscopy;  Laterality: N/A;   COLONOSCOPY WITH PROPOFOL N/A 07/26/2020   Procedure: COLONOSCOPY WITH PROPOFOL;  Surgeon: Midge Minium, MD;  Location: [facility];  Service: Endoscopy;  Laterality: N/A;   CYST REMOVAL LEG     and on shoulder    ESOPHAGOGASTRODUODENOSCOPY (EGD) WITH PROPOFOL N/A 07/26/2020   Procedure: ESOPHAGOGASTRODUODENOSCOPY (EGD) WITH PROPOFOL;  Surgeon: Midge Minium, MD;  Location: [facility];  Service: Endoscopy;  Laterality: N/A;  Diabetic - oral meds   LOWER EXTREMITY ANGIOGRAPHY Left 09/29/2018   Procedure: LOWER EXTREMITY ANGIOGRAPHY;  Surgeon: Annice Needy, MD;  Location: [facility];  Service: Cardiovascular;  Laterality: Left;   LUNG BIOPSY  12/30/2011   has lung "spots"   PACEMAKER IMPLANT  07/14/2021   PACEMAKER LEADLESS INSERTION N/A 07/14/2021   Procedure: PACEMAKER LEADLESS INSERTION;  Surgeon: Marcina Millard, MD;  Location: [facility];  Service: Cardiovascular;  Laterality: N/A;   PERIPHERAL VASCULAR CATHETERIZATION Left 06/01/2016   Procedure: Lower Extremity Angiography;  Surgeon: Annice Needy, MD;  Location: [facility];  Service: Cardiovascular;  Laterality: Left;   PERIPHERAL VASCULAR CATHETERIZATION N/A 06/01/2016   Procedure: Abdominal Aortogram w/Lower Extremity;  Surgeon: Annice Needy, MD;  Location: [facility];  Service: Cardiovascular;  Laterality: N/A;   PERIPHERAL VASCULAR CATHETERIZATION  06/01/2016    Procedure: Lower Extremity Intervention;  Surgeon: Annice Needy, MD;  Location: [facility];  Service: Cardiovascular;;   PERIPHERAL VASCULAR CATHETERIZATION Right 06/08/2016   Procedure: Lower Extremity Angiography;  Surgeon: Annice Needy, MD;  Location: [facility];  Service: Cardiovascular;  Laterality: Right;   PERIPHERAL VASCULAR CATHETERIZATION  06/08/2016   Procedure: Lower Extremity Intervention;  Surgeon: Annice Needy, MD;  Location: [facility];  Service: Cardiovascular;;   SUBMANDIBULAR GLAND EXCISION Left 12/06/2020   Procedure: EXCISION SUBMANDIBULAR GLAND;  Surgeon: Linus Salmons, MD;  Location: [facility];  Service: ENT;  Laterality: Left;  needs to be first case Diabetic - diet controlled   TEMPORARY PACEMAKER N/A 07/11/2021   Procedure: TEMPORARY PACEMAKER;  Surgeon: Marcina Millard, MD;  Location: [facility];  Service: Cardiovascular;  Laterality: N/A;   Social History:  reports that she quit smoking about 13 years ago. Her smoking use included cigarettes. She started smoking about 50 years ago. She has a 37 pack-year smoking history. She has quit using smokeless tobacco.  Her smokeless tobacco use included snuff. She reports that she does not currently use alcohol after a past usage of about 5.0 standard drinks of alcohol per week. She reports that she does not currently use drugs after having used the following drugs: Marijuana, "Crack" cocaine, and Cocaine.  Allergies  Allergen Reactions   Trelegy Ellipta [Fluticasone-Umeclidin-Vilant]     Had breathing issues    Family History  Problem Relation Age of Onset   Diabetes Mother    Hypercholesterolemia Mother    Lung cancer Father    Diabetes Sister    Diabetes Sister    Hypertension Sister    Diabetes Maternal Grandmother    Diabetes Paternal Grandmother    Heart attack Brother    Coronary artery disease Brother    Vascular Disease Brother    Heart attack Brother     Breast cancer Neg Hx     Prior to Admission medications   Medication Sig Start Date End Date Taking? Authorizing Provider  Accu-Chek Softclix Lancets lancets Use 1 lancet 3 times daily to check glucose for diabetes 01/19/23   Sallyanne Kuster, NP  albuterol (VENTOLIN HFA) 108 (90 Base) MCG/ACT inhaler INHALE 2 PUFFS BY MOUTH EVERY 6 HOURS AS NEEDED FOR WHEEZING OR SHORTNESS OF BREATH 04/22/23   Pennie Banter, DO  ALPRAZolam (XANAX) 0.25 MG tablet TAKE 1 TABLET BY MOUTH THREE TIMES DAILY AS NEEDED FOR ANXIETY 04/12/23   Sallyanne Kuster, NP  Blood Glucose Monitoring Suppl (ACCU-CHEK GUIDE) w/Device KIT Use as directed Dx e11.65 04/11/21   Lyndon Code, MD  calcium-vitamin D (OSCAL WITH D) 500-5 MG-MCG tablet Take 1 tablet by mouth 2 (two) times daily. 11/20/22   Marrion Coy, MD  clopidogrel (PLAVIX) 75 MG tablet Take 1 tablet (75 mg total) by mouth daily. 11/30/22   Sallyanne Kuster, NP  Continuous Blood Gluc Receiver (DEXCOM G7 RECEIVER) DEVI Use one as directed for uncontrolled dm 06/09/22   Sallyanne Kuster, NP  Continuous Blood Gluc Sensor (DEXCOM G7 SENSOR) MISC Apply 1 sensor to skin on designated areas every 10 days to monitor glucose levels with sensor or smart phone app 07/02/22   Sallyanne Kuster, NP  cyclobenzaprine (FLEXERIL) 10 MG tablet Take 1 tablet (10 mg total) by mouth at bedtime. Take one tab po qhs for back spasm prn only 11/30/22   Sallyanne Kuster, NP  Ensifentrine (OHTUVAYRE) 3 MG/2.5ML SUSP Inhale 3 mg into the lungs in the morning and at bedtime. 04/30/23   Sallyanne Kuster, NP  escitalopram (LEXAPRO) 10 MG tablet Take 1 tablet by mouth once daily 05/27/23   Sallyanne Kuster, NP  famotidine (PEPCID) 20 MG tablet Take 1 tablet (20 mg total) by mouth daily for 14 days. Patient taking differently: Take 20 mg by mouth daily as needed for heartburn. 06/14/21 06/08/23  Lynn Ito, MD  ferrous sulfate 325 (65 FE) MG tablet Take 325 mg by mouth 2 (two) times daily with a  meal.    [provider]  fluticasone (FLONASE) 50 MCG/ACT nasal spray Place 2 sprays into both nostrils daily. Patient not taking: Reported on 06/08/2023 05/09/21   Esaw Grandchild A, DO  furosemide (LASIX) 20 MG tablet Take 1 tablet (20 mg total) by mouth daily as needed. 04/27/23   Sallyanne Kuster, NP  gabapentin (NEURONTIN) 300 MG capsule Take 1 capsule (300 mg total) by mouth 3 (three) times daily. Patient taking differently: Take 600 mg by mouth daily. 03/02/23   Sallyanne Kuster, NP  glucose blood (ACCU-CHEK GUIDE) test strip USE TO TEST BLOOD SUGARS 5 TIMES DAILY 04/01/23   Sallyanne Kuster, NP  HYDROcodone-acetaminophen (NORCO/VICODIN) 5-325 MG tablet Take 1 tablet by mouth every 4 (four) hours as needed for moderate pain. Patient not taking: Reported on 06/08/2023 04/22/23   Esaw Grandchild A, DO  insulin detemir (LEVEMIR FLEXTOUCH) 100 UNIT/ML FlexPen Inject 15 Units into the skin daily with supper. 10/08/22   Sallyanne Kuster, NP  Insulin Pen Needle (RELION PEN NEEDLES) 31G X 6 MM MISC USE 1 PEN NEEDLE WITH INSULIN PEN ONCE DAILY FOR DIABETES 05/05/23   Sallyanne Kuster, NP  ipratropium-albuterol (DUONEB) 0.5-2.5 (3) MG/3ML SOLN Take 3 mLs by nebulization every 4 (four) hours as needed. 11/30/22   Sallyanne Kuster, NP  lisinopril-hydrochlorothiazide (ZESTORETIC) 20-25 MG tablet Take 1 tablet by mouth daily. 02/03/22   [provider]  metFORMIN (GLUCOPHAGE) 500 MG tablet TAKE 2 TABLETS BY MOUTH ONCE DAILY WITH BREAKFAST 06/01/23   Sallyanne Kuster, NP  metoprolol succinate (TOPROL-XL) 25 MG 24 hr tablet Take 1 tablet (25 mg total) by mouth daily. 04/12/23   Sallyanne Kuster, NP  mometasone-formoterol (DULERA) 200-5 MCG/ACT AERO Inhale 2 puffs into the lungs 2 (two) times daily. 06/19/22   Sallyanne Kuster, NP  mupirocin ointment (BACTROBAN) 2 % Apply 1 Application topically 2 (two) times daily. To wound on buttocks until healed. 04/29/23   Sallyanne Kuster, NP  Omega-3  Fatty Acids (FISH OIL) 1000 MG CAPS Take 1 capsule by mouth daily.    [provider]  OXYGEN Inhale 4 L into the lungs. PT USES [facility] FOR OXYGEN    [provider]  potassium chloride (KLOR-CON) 10 MEQ tablet Take 1 tablet (10 mEq total) by mouth every other day. 11/30/22   Sallyanne Kuster, NP  SPIRIVA HANDIHALER 18 MCG inhalation capsule INHALE THE CONTENTS OF 1 CAPSULES BY MOUTH ONCE DAILY - DO NOT SWALLOW CAPSULES 12/31/22   McDonough, Lauren K, PA-C  VITAMIN D, CHOLECALCIFEROL, PO Take 1  tablet by mouth daily with supper.    [provider]    Physical Exam: Vitals:   06/15/23 0000 06/15/23 0030 06/15/23 0130 06/15/23 0136  BP: 134/60 (!) 143/78 (!) 141/81   Pulse: 92 89 95 94  Resp: 18 12 11 16   Temp:      SpO2: 100% 100% 95% 95%   Physical Exam Vitals and nursing note reviewed.  Constitutional:      General: She is not in acute distress.    Comments: On bipap  HENT:     Head: Normocephalic and atraumatic.  Cardiovascular:     Rate and Rhythm: Normal rate and regular rhythm.     Heart sounds: Normal heart sounds.  Pulmonary:     Effort: Pulmonary effort is normal.     Breath sounds: Wheezing and rhonchi present.  Abdominal:     Palpations: Abdomen is soft.     Tenderness: There is no abdominal tenderness.  Musculoskeletal:     Right lower leg: No edema.     Left lower leg: No edema.  Neurological:     Mental Status: Mental status is at baseline.     Labs on Admission: I have personally reviewed following labs and imaging studies  CBC: Recent Labs  Lab 06/14/23 2346  WBC 5.7  NEUTROABS 3.5  HGB 7.4*  HCT 27.0*  MCV 105.1*  PLT 331   Basic Metabolic Panel: Recent Labs  Lab 06/14/23 2346  NA 140  K 3.8  CL 103  CO2 29  GLUCOSE 144*  BUN 14  CREATININE 0.54  CALCIUM 8.5*  MG 2.0   GFR: Estimated Creatinine Clearance: 51.9 mL/min (by C-G formula based on SCr of 0.54 mg/dL). Liver Function Tests: Recent Labs  Lab  06/14/23 2346  AST 95*  ALT 67*  ALKPHOS 68  BILITOT 0.4  PROT 6.4*  ALBUMIN 3.6   No results for input(s): "LIPASE", "AMYLASE" in the last 168 hours. No results for input(s): "AMMONIA" in the last 168 hours. Coagulation Profile: No results for input(s): "INR", "PROTIME" in the last 168 hours. Cardiac Enzymes: No results for input(s): "CKTOTAL", "CKMB", "CKMBINDEX", "TROPONINI" in the last 168 hours. BNP (last 3 results) No results for input(s): "PROBNP" in the last 8760 hours. HbA1C: No results for input(s): "HGBA1C" in the last 72 hours. CBG: No results for input(s): "GLUCAP" in the last 168 hours. Lipid Profile: No results for input(s): "CHOL", "HDL", "LDLCALC", "TRIG", "CHOLHDL", "LDLDIRECT" in the last 72 hours. Thyroid Function Tests: No results for input(s): "TSH", "T4TOTAL", "FREET4", "T3FREE", "THYROIDAB" in the last 72 hours. Anemia Panel: No results for input(s): "VITAMINB12", "FOLATE", "FERRITIN", "TIBC", "IRON", "RETICCTPCT" in the last 72 hours. Urine analysis:    Component Value Date/Time   COLORURINE STRAW (A) 04/17/2023 0930   APPEARANCEUR CLEAR (A) 04/17/2023 0930   APPEARANCEUR Clear 08/06/2022 1541   LABSPEC 1.014 04/17/2023 0930   PHURINE 6.0 04/17/2023 0930   GLUCOSEU NEGATIVE 04/17/2023 0930   HGBUR MODERATE (A) 04/17/2023 0930   BILIRUBINUR NEGATIVE 04/17/2023 0930   BILIRUBINUR Negative 08/06/2022 1541   KETONESUR NEGATIVE 04/17/2023 0930   PROTEINUR NEGATIVE 04/17/2023 0930   UROBILINOGEN 0.2 04/22/2021 0959   NITRITE NEGATIVE 04/17/2023 0930   LEUKOCYTESUR NEGATIVE 04/17/2023 0930    Radiological Exams on Admission: No results found.   Data Reviewed: Relevant notes from primary care and specialist visits, past discharge summaries as available in EHR, including Care Everywhere. Prior diagnostic testing as pertinent to current admission diagnoses Updated medications and problem lists  for reconciliation ED course, including vitals, labs,  imaging, treatment and response to treatment Triage notes, nursing and pharmacy notes and ED provider's notes Notable results as noted in HPI   Assessment and Plan: * Acute on chronic respiratory failure with hypoxia and hypercapnia (HCC) Likely secondary to COPD exacerbation, possible small component of CHF.  Pneumothorax not seen on x-ray pending formal read Continue BiPAP and wean as tolerated See specific problem for treatment  Acute on chronic diastolic CHF (congestive heart failure) (HCC) Mild pulmonary vascular congestion with BNP 126 Received a dose of Lasix in the ED Will continue home Lasix and metoprolol Daily weights with intake and output monitoring Will get updated echo (last echo 04/2022 with EF 50%)  COPD exacerbation (HCC) Scheduled and as needed nebulized bronchodilators IV steroids Flutter valve, antitussives Supplemental oxygen  Cancer of lower lobe of right lung (HCC) History of recurrent right pneumothorax On official x-ray read without pneumothorax.  Was admitted in August 2024 for pneumothorax Low threshold for repeat chest x-ray to evaluate for pneumothorax if worsening shortness of breath, especially in view of BiPAP requirement  Transaminitis Suspect related to hepatic congestion Continue to monitor with further workup if not improving  Essential hypertension Continue metoprolol.  Diabetes mellitus without complication (HCC) Sliding scale insulin coverage and continue home basal insulin  PAD (peripheral artery disease) (HCC) Does not appear to be on statins or antiplatelets.  Continue omega-3 fatty acid  Macrocytic anemia Hemoglobin at baseline at 7.4 Anemia panel  Anxiety and depression Continue Lexapro and Xanax     DVT prophylaxis: Lovenox  Consults: none  Advance Care Planning:   Code Status: Prior   Family Communication: none  Disposition Plan: Back to previous home environment  Severity of Illness: The appropriate patient  status for this patient is INPATIENT. Inpatient status is judged to be reasonable and necessary in order to provide the required intensity of service to ensure the patient's safety. The patient's presenting symptoms, physical exam findings, and initial radiographic and laboratory data in the context of their chronic comorbidities is felt to place them at high risk for further clinical deterioration. Furthermore, it is not anticipated that the patient will be medically stable for discharge from the hospital within 2 midnights of admission.   * I certify that at the point of admission it is my clinical judgment that the patient will require inpatient hospital care spanning beyond 2 midnights from the point of admission due to high intensity of service, high risk for further deterioration and high frequency of surveillance required.*  Author: Andris Baumann, MD 06/15/2023 1:44 AM  For on call review [email or web address].

## 2023-06-15 NOTE — Progress Notes (Signed)
Pt taken off bipap and placed on 3lpm [place], sats 100%, respiratory rate 18/min, tolerating well at this time. RN made aware.

## 2023-06-15 NOTE — Progress Notes (Signed)
Progress Note   Patient: Angela Jensen OZH:[id number] DOB: [birth year] DOA: 06/14/2023     0 DOS: the patient was seen and examined on 06/15/2023   Brief hospital course: 67 year old female past medical history of COPD on 4 L nasal cannula, right lung cancer s/p radiation therapy and recurrent pneumothorax, history of CHF, hypertension, diabetes and peripheral vascular disease.  She presented to the ER with respiratory distress requiring BiPAP.  With EMS she was hypoxic in the 80s.  She received Solu-Medrol and DuoNeb en route.  10/29.  Hemoglobin 7.4.  Patient agreeable to blood transfusion.  Patient on nasal cannula this morning.  Blood gas with a pCO2 55 will see if we can get noninvasive ventilation approved for home at night.  Assessment and Plan: * Acute on chronic respiratory failure with hypoxia and hypercapnia (HCC) With EMS had a pulse ox in the 80s.  Initially required BiPAP for respiratory distress on presentation.  With high pCO2, will see if we can get noninvasive ventilation approved for home.  Likely secondary to COPD exacerbation.  Continue Solu-Medrol today and hopefully switch over to prednisone for tomorrow.  Continue nebulizer treatments.  Acute on chronic diastolic CHF (congestive heart failure) (HCC) More likely secondary to COPD.  Will give 1 dose of Lasix with blood transfusion.  COPD exacerbation (HCC) Continue Solu-Medrol today and nebulizer treatments.  Iron deficiency anemia Hemoglobin 7.4.  Since respiratory status better I will give 1 unit of packed red blood cells today.  Benefits and risks explained.  This could be contributing to the patient's shortness of breath.  Transaminitis Suspect related to hepatic congestion   History of recurrent right pneumothorax No pneumothorax seen on chest x-ray.  Cancer of lower lobe of right lung [facility]) Follow-up as outpatient.  Patient does have some pain on the right side.  Essential hypertension Continue  Toprol  Diabetes mellitus without complication (HCC) Sliding scale insulin coverage and continue home basal insulin  PAD (peripheral artery disease) (HCC) Continue Plavix  Overweight (BMI 25.0-29.9) BMI 26.05.  Anxiety and depression Continue Lexapro and Xanax        Subjective: Patient feeling a lot better than when she came in.  Came in shortness of breath and respiratory distress and required BiPAP.  Seen while echocardiogram was being done.  Patient found to be very anemic and was okay with blood transfusion.  Physical Exam: Vitals:   06/15/23 1230 06/15/23 1255 06/15/23 1300 06/15/23 1330  BP: 133/87  118/86 134/76  Pulse: 96  100 85  Resp: 17     Temp:  98.7 F (37.1 C)    TempSrc:  Oral    SpO2: 97%  97% 100%  Weight:      Height:       Physical Exam HENT:     Head: Normocephalic.     Mouth/Throat:     Pharynx: No oropharyngeal exudate.  Eyes:     General: Lids are normal.     Comments: Pale conjunctiva  Cardiovascular:     Rate and Rhythm: Normal rate and regular rhythm.     Heart sounds: Normal heart sounds, S1 normal and S2 normal.  Pulmonary:     Breath sounds: Examination of the right-lower field reveals decreased breath sounds. Examination of the left-lower field reveals decreased breath sounds. Decreased breath sounds present. No wheezing, rhonchi or rales.  Abdominal:     Palpations: Abdomen is soft.     Tenderness: There is no abdominal tenderness.  Musculoskeletal:  Right lower leg: No swelling.     Left lower leg: No swelling.  Skin:    General: Skin is warm.     Findings: No rash.  Neurological:     Mental Status: She is alert and oriented to person, place, and time.     Data Reviewed: Hemoglobin 7.4, MCV 105, ferritin 22, creatinine 0.54 Chest x-ray shows hyperinflation with emphysema similar bandlike densities and mild distortion in the right lower lung most likely secondary to post therapeutic changes.  Family Communication:  Husband at the bedside  Disposition: Status is: Inpatient Remains inpatient appropriate because:   Planned Discharge Destination: Home    Time spent: 28 minutes  Author: Alford Highland, MD 06/15/2023 2:25 PM  For on call review [email or web address].

## 2023-06-15 NOTE — Assessment & Plan Note (Signed)
No pneumothorax seen on chest xray

## 2023-06-15 NOTE — Assessment & Plan Note (Signed)
Continue Solu-Medrol today and nebulizer treatments.

## 2023-06-15 NOTE — Assessment & Plan Note (Addendum)
Follow-up as outpatient.  Patient does have some pain on the right side.

## 2023-06-15 NOTE — Assessment & Plan Note (Deleted)
Hemoglobin at baseline at 7.4 Anemia panel

## 2023-06-15 NOTE — Assessment & Plan Note (Signed)
Hemoglobin 7.4.  Since respiratory status better I will give 1 unit of packed red blood cells today.  Benefits and risks explained.  This could be contributing to the patient's shortness of breath.

## 2023-06-15 NOTE — Hospital Course (Addendum)
67 year old female past medical history of COPD on 4 L nasal cannula, right lung cancer s/p radiation therapy and recurrent pneumothorax, history of CHF, hypertension, diabetes and peripheral vascular disease.  She presented to the ER with respiratory distress requiring BiPAP.  With EMS she was hypoxic in the 80s.  She received Solu-Medrol and DuoNeb en route.  Patient condition was due to COPD exacerbation, she also had a significant CO2 retention.  She was treated with steroids, her condition is improved.  We also had a NIV set up for home use.  Will deliver to her today. She also chronic iron deficient anemia, she was getting IV iron infusion in the cancer clinic, she received 1 unit PRBC, she also given IV iron.  B12 borderline, pending homocystine.  Will continue B12 supplement until homocystine level is available.

## 2023-06-15 NOTE — ED Notes (Signed)
Pt taken off bipap at this time per MD

## 2023-06-15 NOTE — TOC Initial Note (Signed)
Transition of Care [facility]) - Initial/Assessment Note    Patient Details  Name: Angela Jensen MRN: [id number] Date of Birth: August 08, 1956  Transition of Care [facility]) CM/SW Contact:    Marquita Palms, LCSW Phone Number: 06/15/2023, 7:52 AM  Clinical Narrative:                  CSW acknowledges SNF consult. CSW will discuss will discuss with patient and family once PT has evaluated patient and recommendation.       Patient Goals and CMS Choice            Expected Discharge Plan and Services                                              Prior Living Arrangements/Services                       Activities of Daily Living      Permission Sought/Granted                  Emotional Assessment              Admission diagnosis:  Acute on chronic respiratory failure with hypoxia and hypercapnia (HCC) [N82.95, J96.22] Patient Active Problem List   Diagnosis Date Noted   Acute on chronic respiratory failure with hypoxia and hypercapnia (HCC) 06/15/2023   Acute on chronic diastolic CHF (congestive heart failure) (HCC) 06/15/2023   Macrocytic anemia 06/15/2023   Transaminitis 06/15/2023   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   Acute pain of right shoulder 11/30/2022   Anxiety and depression 11/19/2022   GERD without esophagitis 11/19/2022   Uncontrolled type 2 diabetes mellitus with hyperglycemia, with long-term current use of insulin (HCC) 11/19/2022   Right rib fracture 09/23/2022   Symptomatic bradycardia 07/09/2021   Chronic respiratory failure with hypoxia (HCC)    CAP (community acquired pneumonia) 06/26/2021   COPD exacerbation (HCC) 06/11/2021   SOB (shortness of breath) 05/01/2021   HCAP (healthcare-associated pneumonia) 05/01/2021   Chronic combined systolic and diastolic CHF (congestive heart failure) (HCC) 05/01/2021   Acute exacerbation of chronic obstructive pulmonary disease (COPD) (HCC) 04/30/2021    Elevated troponin 04/01/2021   Acute on chronic respiratory failure with hypoxia (HCC) 03/31/2021   History of recurrent right pneumothorax 05/01/2020   Chest pain on breathing 04/21/2020   Pleuritic chest pain 04/11/2020   Right lower lobe pulmonary nodule 04/02/2020   Normocytic anemia 04/02/2020   Pica in adults 04/02/2020   Goals of care, counseling/discussion 03/14/2020   Pain in left wrist 09/24/2019   Encounter for general adult medical examination with abnormal findings 06/28/2019   Elevated total protein 01/22/2019   Uncontrolled type 2 diabetes mellitus with hyperglycemia (HCC) 05/16/2018   Chronic obstructive pulmonary disease (HCC) 05/16/2018   Vitamin D deficiency 05/16/2018   Flu vaccine need 05/16/2018   Dysuria 05/16/2018   Cancer of lower lobe of right lung (HCC) 01/21/2018   Screening for breast cancer 10/12/2017   Personal history of tobacco use, presenting hazards to health 02/03/2017   PAD (peripheral artery disease) (HCC) 06/22/2016   Essential hypertension 05/26/2016   Diabetes mellitus without complication (HCC) 05/26/2016   Hyperlipidemia associated with type 2 diabetes mellitus (HCC) 05/26/2016   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/2016   Uterine leiomyoma  04/30/2016   Elevated CEA 01/31/2016   Special screening for malignant neoplasms, colon    Benign neoplasm of sigmoid colon    Primary lung cancer (HCC)    Malignant neoplasm of upper lobe of right lung (HCC)    PCP:  Sallyanne Kuster, NP Pharmacy:   [facility] [address] ([address]), [place] - [address] ([address]) [place] [address] Phone: [phone] Fax: [phone]     Social Determinants of Health (SDOH) Social History: SDOH Screenings   Food Insecurity: No Food Insecurity (11/19/2022)  Housing: Low Risk  (11/19/2022)  Transportation Needs: No Transportation Needs (11/19/2022)  Utilities: Not  At Risk (11/19/2022)  Alcohol Screen: Low Risk  (12/11/2021)  Depression (PHQ2-9): Low Risk  (10/08/2022)  Tobacco Use: Medium Risk (06/08/2023)   SDOH Interventions:     Readmission Risk Interventions    06/13/2021    2:40 PM 05/05/2021   12:24 PM  Readmission Risk Prevention Plan  Transportation Screening Complete Complete  PCP or Specialist Appt within 5-7 Days  Complete  Home Care Screening  Complete  Medication Review (RN CM)  Complete  Social Work Consult for Recovery Care Planning/Counseling Complete   Palliative Care Screening Not Applicable   Medication Review Oceanographer) Complete

## 2023-06-15 NOTE — Assessment & Plan Note (Signed)
?   Sliding scale insulin coverage and continue home basal insulin ?

## 2023-06-15 NOTE — ED Notes (Signed)
Pt resting comfortably on Bipap at this time.

## 2023-06-15 NOTE — ED Notes (Signed)
Pt became [facility] and had increased work of breathing with O2 drop to 84% pt placed back on Bipap. MD to bedside and assessed pt.

## 2023-06-15 NOTE — Assessment & Plan Note (Deleted)
Scheduled and as needed nebulized bronchodilators IV steroids Flutter valve, antitussives Supplemental oxygen

## 2023-06-16 DIAGNOSIS — I5033 Acute on chronic diastolic (congestive) heart failure: Secondary | ICD-10-CM | POA: Diagnosis not present

## 2023-06-16 DIAGNOSIS — J441 Chronic obstructive pulmonary disease with (acute) exacerbation: Secondary | ICD-10-CM | POA: Diagnosis not present

## 2023-06-16 DIAGNOSIS — J9622 Acute and chronic respiratory failure with hypercapnia: Secondary | ICD-10-CM | POA: Diagnosis not present

## 2023-06-16 DIAGNOSIS — J9621 Acute and chronic respiratory failure with hypoxia: Secondary | ICD-10-CM | POA: Diagnosis not present

## 2023-06-16 LAB — TYPE AND SCREEN
ABO/RH(D): O POS
Antibody Screen: NEGATIVE
Unit division: 0

## 2023-06-16 LAB — BASIC METABOLIC PANEL
Anion gap: 9 (ref 5–15)
BUN: 18 mg/dL (ref 8–23)
CO2: 33 mmol/L — ABNORMAL HIGH (ref 22–32)
Calcium: 8.7 mg/dL — ABNORMAL LOW (ref 8.9–10.3)
Chloride: 98 mmol/L (ref 98–111)
Creatinine, Ser: 0.5 mg/dL (ref 0.44–1.00)
GFR, Estimated: >60 mL/min (ref >60)
Glucose, Bld: 132 mg/dL — ABNORMAL HIGH (ref 70–99)
Potassium: 3.4 mmol/L — ABNORMAL LOW (ref 3.5–5.1)
Sodium: 140 mmol/L (ref 135–145)

## 2023-06-16 LAB — GLUCOSE, CAPILLARY
Glucose-Capillary: 128 mg/dL — ABNORMAL HIGH (ref 70–99)
Glucose-Capillary: 151 mg/dL — ABNORMAL HIGH (ref 70–99)
Glucose-Capillary: 195 mg/dL — ABNORMAL HIGH (ref 70–99)
Glucose-Capillary: 230 mg/dL — ABNORMAL HIGH (ref 70–99)

## 2023-06-16 LAB — BPAM RBC
Blood Product Expiration Date: 202411262359
ISSUE DATE / TIME: 202410291437
Unit Type and Rh: 5100

## 2023-06-16 LAB — CBC
HCT: 28.5 % — ABNORMAL LOW (ref 36.0–46.0)
Hemoglobin: 9 g/dL — ABNORMAL LOW (ref 12.0–15.0)
MCH: 29.8 pg (ref 26.0–34.0)
MCHC: 31.6 g/dL (ref 30.0–36.0)
MCV: 94.4 fL (ref 80.0–100.0)
Platelets: 339 10*3/uL (ref 150–400)
RBC: 3.02 MIL/uL — ABNORMAL LOW (ref 3.87–5.11)
RDW: 15.6 % — ABNORMAL HIGH (ref 11.5–15.5)
WBC: 7.4 10*3/uL (ref 4.0–10.5)
nRBC: 0 % (ref 0.0–0.2)

## 2023-06-16 MED ORDER — POTASSIUM CHLORIDE CRYS ER 20 MEQ PO TBCR
40.0000 meq | EXTENDED_RELEASE_TABLET | ORAL | Status: AC
  Administered 2023-06-16 (×2): 40 meq via ORAL
  Filled 2023-06-16 (×2): qty 2

## 2023-06-16 MED ORDER — VITAMIN B-12 1000 MCG PO TABS
1000.0000 ug | ORAL_TABLET | Freq: Every day | ORAL | Status: DC
  Administered 2023-06-16 – 2023-06-18 (×3): 1000 ug via ORAL
  Filled 2023-06-16 (×3): qty 1

## 2023-06-16 NOTE — Progress Notes (Addendum)
Progress Note   Patient: Angela Jensen ZOX:[id number] DOB: Jul 17, 1956 DOA: 06/14/2023     1 DOS: the patient was seen and examined on 06/16/2023   Brief hospital course: 67 year old female past medical history of COPD on 4 L nasal cannula, right lung cancer s/p radiation therapy and recurrent pneumothorax, history of CHF, hypertension, diabetes and peripheral vascular disease.  She presented to the ER with respiratory distress requiring BiPAP.  With EMS she was hypoxic in the 80s.  She received Solu-Medrol and DuoNeb en route.  10/29.  Hemoglobin 7.4.  Had 1 unit PRBC.  Blood gas with a pCO2 55 will see if we can get noninvasive ventilation approved for home at night.   Principal Problem:   Acute on chronic respiratory failure with hypoxia and hypercapnia (HCC) Active Problems:   COPD exacerbation (HCC)   Acute on chronic diastolic CHF (congestive heart failure) (HCC)   Iron deficiency anemia   Cancer of lower lobe of right lung (HCC)   History of recurrent right pneumothorax   Transaminitis   Essential hypertension   Diabetes mellitus without complication (HCC)   PAD (peripheral artery disease) (HCC)   Normocytic anemia   Anxiety and depression   Overweight (BMI 25.0-29.9)   Assessment and Plan:  * Acute on chronic respiratory failure with hypoxia and hypercapnia (HCC) COPD exacerbation. With EMS had a pulse ox in the 80s.  Initially required BiPAP for respiratory distress on presentation.  Respiratory failure probably due to COPD exacerbation. Continue steroids, DuoNeb.  Add incentive spirometer.  Acute on chronic diastolic CHF (congestive heart failure) (HCC) Patient initially received IV Lasix, BNP 126.9.  Hypokalemia. Repleted.  Recheck levels tomorrow.  COPD exacerbation (HCC) Continue Solu-Medrol today and nebulizer treatments.  Iron deficiency anemia Globin 7.4 at time of admission, due to chronic congestive heart failure, patient received 1 unit PRBC.  Also  started oral iron. B12 level borderline at 383, check homocystine level, start B12 supplement until homocystine level available.  Transaminitis Suspect related to hepatic congestion   History of recurrent right pneumothorax No pneumothorax seen on chest x-ray.  Cancer of lower lobe of right lung [facility]) Follow-up as outpatient.  Patient does have some pain on the right side.  Essential hypertension Continue Toprol  Diabetes mellitus without complication (HCC) Sliding scale insulin coverage and continue home basal insulin  PAD (peripheral artery disease) (HCC) Continue Plavix  Overweight (BMI 25.0-29.9) BMI 26.05.  Anxiety and depression Continue Lexapro and Xanax      Subjective:  Patient still has short of breath with exertion, cough, nonproductive.  Physical Exam: Vitals:   06/16/23 0404 06/16/23 0500 06/16/23 0821 06/16/23 1223  BP: 112/70  (!) 162/82 (!) 141/87  Pulse: 81  88 93  Resp: 18  16 16   Temp:   97.8 F (36.6 C) 97.9 F (36.6 C)  TempSrc:   Oral   SpO2: 99%  93% 94%  Weight:  53.8 kg    Height:       General exam: Appears calm and comfortable  Respiratory system: Decreased breathing sounds . Respiratory effort normal. Cardiovascular system: S1 & S2 heard, RRR. No JVD, murmurs, rubs, gallops or clicks. No pedal edema. Gastrointestinal system: Abdomen is nondistended, soft and nontender. No organomegaly or masses felt. Normal bowel sounds heard. Central nervous system: Alert and oriented. No focal neurological deficits. Extremities: Symmetric 5 x 5 power. Skin: No rashes, lesions or ulcers Psychiatry: Judgement and insight appear normal. Mood & affect appropriate.    Data Reviewed:  X-ray and lab results reviewed.  Family Communication: None  Disposition: Status is: Inpatient Remains inpatient appropriate because: Severity of disease.     Time spent: 35 minutes  Author: Marrion Coy, MD 06/16/2023 1:36 PM  For on call review  [email or web address].

## 2023-06-16 NOTE — TOC Progression Note (Signed)
Transition of Care [facility]) - Progression Note    Patient Details  Name: Angela Jensen MRN: [id number] Date of Birth: 15-Apr-1956  Transition of Care [facility]) CM/SW Contact  Truddie Hidden, RN Phone Number: 06/16/2023, 2:39 PM  Clinical Narrative:    Per MD patient will require NIV. Patient has [facility] referral sent to [facility] from [facility].   RNCM spoke with patient regarding PT/OT as this was arranged by [facility] 09/24. Patient stated she cancelled services and is not currently active. She does not wish to have HH at this time.         Expected Discharge Plan and Services                                               Social Determinants of Health (SDOH) Interventions SDOH Screenings   Food Insecurity: No Food Insecurity (06/15/2023)  Housing: Low Risk  (06/15/2023)  Transportation Needs: No Transportation Needs (06/15/2023)  Utilities: Not At Risk (06/15/2023)  Alcohol Screen: Low Risk  (12/11/2021)  Depression (PHQ2-9): Low Risk  (10/08/2022)  Tobacco Use: Medium Risk (06/15/2023)    Readmission Risk Interventions    06/13/2021    2:40 PM 05/05/2021   12:24 PM  Readmission Risk Prevention Plan  Transportation Screening Complete Complete  PCP or Specialist Appt within 5-7 Days  Complete  Home Care Screening  Complete  Medication Review (RN CM)  Complete  Social Work Consult for Recovery Care Planning/Counseling Complete   Palliative Care Screening Not Applicable   Medication Review Oceanographer) Complete

## 2023-06-17 DIAGNOSIS — J9622 Acute and chronic respiratory failure with hypercapnia: Secondary | ICD-10-CM | POA: Diagnosis not present

## 2023-06-17 DIAGNOSIS — I5033 Acute on chronic diastolic (congestive) heart failure: Secondary | ICD-10-CM | POA: Diagnosis not present

## 2023-06-17 DIAGNOSIS — J9621 Acute and chronic respiratory failure with hypoxia: Secondary | ICD-10-CM | POA: Diagnosis not present

## 2023-06-17 DIAGNOSIS — J441 Chronic obstructive pulmonary disease with (acute) exacerbation: Secondary | ICD-10-CM | POA: Diagnosis not present

## 2023-06-17 LAB — CBC
HCT: 25.5 % — ABNORMAL LOW (ref 36.0–46.0)
Hemoglobin: 7.8 g/dL — ABNORMAL LOW (ref 12.0–15.0)
MCH: 29.3 pg (ref 26.0–34.0)
MCHC: 30.6 g/dL (ref 30.0–36.0)
MCV: 95.9 fL (ref 80.0–100.0)
Platelets: 320 10*3/uL (ref 150–400)
RBC: 2.66 MIL/uL — ABNORMAL LOW (ref 3.87–5.11)
RDW: 15 % (ref 11.5–15.5)
WBC: 6.2 10*3/uL (ref 4.0–10.5)
nRBC: 0 % (ref 0.0–0.2)

## 2023-06-17 LAB — BASIC METABOLIC PANEL
Anion gap: 7 (ref 5–15)
BUN: 27 mg/dL — ABNORMAL HIGH (ref 8–23)
CO2: 32 mmol/L (ref 22–32)
Calcium: 8.5 mg/dL — ABNORMAL LOW (ref 8.9–10.3)
Chloride: 101 mmol/L (ref 98–111)
Creatinine, Ser: 0.57 mg/dL (ref 0.44–1.00)
GFR, Estimated: >60 mL/min (ref >60)
Glucose, Bld: 153 mg/dL — ABNORMAL HIGH (ref 70–99)
Potassium: 3.6 mmol/L (ref 3.5–5.1)
Sodium: 140 mmol/L (ref 135–145)

## 2023-06-17 LAB — GLUCOSE, CAPILLARY
Glucose-Capillary: 100 mg/dL — ABNORMAL HIGH (ref 70–99)
Glucose-Capillary: 180 mg/dL — ABNORMAL HIGH (ref 70–99)
Glucose-Capillary: 203 mg/dL — ABNORMAL HIGH (ref 70–99)
Glucose-Capillary: 88 mg/dL (ref 70–99)

## 2023-06-17 LAB — MAGNESIUM: Magnesium: 2.4 mg/dL (ref 1.7–2.4)

## 2023-06-17 MED ORDER — IPRATROPIUM-ALBUTEROL 0.5-2.5 (3) MG/3ML IN SOLN
3.0000 mL | Freq: Two times a day (BID) | RESPIRATORY_TRACT | Status: DC
  Administered 2023-06-17 – 2023-06-18 (×2): 3 mL via RESPIRATORY_TRACT
  Filled 2023-06-17 (×2): qty 3

## 2023-06-17 MED ORDER — IRON SUCROSE 300 MG IVPB - SIMPLE MED
300.0000 mg | Freq: Once | Status: AC
  Administered 2023-06-17: 300 mg via INTRAVENOUS
  Filled 2023-06-17: qty 300

## 2023-06-17 NOTE — Progress Notes (Signed)
OT Cancellation Note  Patient Details Name: Angela Jensen MRN: [id number] DOB: [birth year]   Cancelled Treatment:    Reason Eval/Treat Not Completed: OT screened, no needs identified, will sign off. Pt seen ambulating in the hallway with PT who reports pt states she is at her baseline with no therapy needs. OT spoke with pt to confirm she feels she does not require acute OT services nor HH services upon DC home. OT screen performed and will sign off.  [name] [name] 06/17/2023, 9:20 AM

## 2023-06-17 NOTE — Progress Notes (Signed)
Patients condition quickly deteriorates without ventilator. Removal of the ventilator may cause serious harm to the patient, exacerbation of condition and hospital readmission.   Bilevel/RAD has been tried and failed to maintain or stabilize the patient. Bilevel cannot meet current volume requirements. Patient requires frequent durations of ventilatory support. Intermittent usage is insufficient.

## 2023-06-17 NOTE — TOC Progression Note (Signed)
Transition of Care [facility]) - Progression Note    Patient Details  Name: Angela Jensen MRN: [id number] Date of Birth: Nov 13, 1955  Transition of Care [facility]) CM/SW Contact  Truddie Hidden, RN Phone Number: 06/17/2023, 1:53 PM  Clinical Narrative:    NIV order returned to [facility] from [employer] and is processing.         Expected Discharge Plan and Services                                               Social Determinants of Health (SDOH) Interventions SDOH Screenings   Food Insecurity: No Food Insecurity (06/15/2023)  Housing: Low Risk  (06/15/2023)  Transportation Needs: No Transportation Needs (06/15/2023)  Utilities: Not At Risk (06/15/2023)  Alcohol Screen: Low Risk  (12/11/2021)  Depression (PHQ2-9): Low Risk  (10/08/2022)  Tobacco Use: Medium Risk (06/15/2023)    Readmission Risk Interventions    06/13/2021    2:40 PM 05/05/2021   12:24 PM  Readmission Risk Prevention Plan  Transportation Screening Complete Complete  PCP or Specialist Appt within 5-7 Days  Complete  Home Care Screening  Complete  Medication Review (RN CM)  Complete  Social Work Consult for Recovery Care Planning/Counseling Complete   Palliative Care Screening Not Applicable   Medication Review Oceanographer) Complete

## 2023-06-17 NOTE — Progress Notes (Signed)
Progress Note   Patient: Angela Jensen VWU:[id number] DOB: 02/18/1956 DOA: 06/14/2023     2 DOS: the patient was seen and examined on 06/17/2023   Brief hospital course: 67 year old female past medical history of COPD on 4 L nasal cannula, right lung cancer s/p radiation therapy and recurrent pneumothorax, history of CHF, hypertension, diabetes and peripheral vascular disease.  She presented to the ER with respiratory distress requiring BiPAP.  With EMS she was hypoxic in the 80s.  She received Solu-Medrol and DuoNeb en route.  10/29.  Hemoglobin 7.4.  Had 1 unit PRBC.  Blood gas with a pCO2 55 will see if we can get noninvasive ventilation approved for home at night.   Principal Problem:   Acute on chronic respiratory failure with hypoxia and hypercapnia (HCC) Active Problems:   COPD exacerbation (HCC)   Acute on chronic diastolic CHF (congestive heart failure) (HCC)   Iron deficiency anemia   Cancer of lower lobe of right lung (HCC)   History of recurrent right pneumothorax   Transaminitis   Essential hypertension   Diabetes mellitus without complication (HCC)   PAD (peripheral artery disease) (HCC)   Normocytic anemia   Anxiety and depression   Overweight (BMI 25.0-29.9)   Assessment and Plan: * Acute on chronic respiratory failure with hypoxia and hypercapnia (HCC) COPD exacerbation. With EMS had a pulse ox in the 80s.  Initially required BiPAP for respiratory distress on presentation.  Respiratory failure probably due to COPD exacerbation. Continue steroids, DuoNeb.  Add incentive spirometer. Condition improving. Due to chronic hypercapnia, patient need NIV as outpatient.  TOC aware.   Acute on chronic diastolic CHF (congestive heart failure) (HCC) Patient initially received IV Lasix, BNP 126.9. Volume status much better today.   Hypokalemia. Potassium normalized.   COPD exacerbation (HCC) Continue Solu-Medrol today and nebulizer treatments.   Iron deficiency  anemia Globin 7.4 at time of admission, due to chronic congestive heart failure, patient received 1 unit PRBC.  Patient is a followed by hematology in the office and receiving IV iron regularly, she is due for another dose of IV iron, will give today. B12 level borderline at 383, check homocystine level, start B12 supplement until homocystine level available.   Transaminitis Suspect related to hepatic congestion     History of recurrent right pneumothorax No pneumothorax seen on chest x-ray.   Cancer of lower lobe of right lung [facility]) Follow-up as outpatient.  Patient does have some pain on the right side.   Essential hypertension Continue Toprol   Diabetes mellitus without complication (HCC) Sliding scale insulin coverage and continue home basal insulin   PAD (peripheral artery disease) (HCC) Continue Plavix   Overweight (BMI 25.0-29.9) BMI 26.05.   Anxiety and depression Continue Lexapro and Xanax        Subjective:  Patient doing much better today, still short of breath with exertion.  Oxygenation at baseline.  Occasional wheezing.  Physical Exam: Vitals:   06/17/23 0334 06/17/23 0742 06/17/23 0848 06/17/23 1236  BP:   130/82 134/80  Pulse:   79 84  Resp:   16 16  Temp:   97.9 F (36.6 C) 97.9 F (36.6 C)  TempSrc:   Oral   SpO2:  100% 97% 98%  Weight: 55.8 kg     Height:       General exam: Appears calm and comfortable  Respiratory system: Decreased breathing sounds.  Respiratory effort normal. Cardiovascular system: S1 & S2 heard, RRR. No JVD, murmurs, rubs, gallops or  clicks. No pedal edema. Gastrointestinal system: Abdomen is nondistended, soft and nontender. No organomegaly or masses felt. Normal bowel sounds heard. Central nervous system: Alert and oriented. No focal neurological deficits. Extremities: Symmetric 5 x 5 power. Skin: No rashes, lesions or ulcers Psychiatry: Judgement and insight appear normal. Mood & affect appropriate.    Data  Reviewed:  Lab results reviewed.  Family Communication: None  Disposition: Status is: Inpatient Remains inpatient appropriate because: Severity of disease.     Time spent: 35 minutes  Author: Marrion Coy, MD 06/17/2023 1:05 PM  For on call review [email or web address].

## 2023-06-17 NOTE — Evaluation (Signed)
Physical Therapy Evaluation Patient Details Name: Angela Jensen MRN: [id number] DOB: August 04, 1956 Today's Date: 06/17/2023  History of Present Illness  67 year old female past medical history of COPD on 4 L nasal cannula, right lung cancer s/p radiation therapy and recurrent pneumothorax, history of CHF, hypertension, diabetes and peripheral vascular disease.  She presented to the ER with respiratory distress requiring BiPAP.  Clinical Impression  Pt did well, confident and safe with mobility and during prolonged bout of ambulation.  Good overall effort with no LOBs, expected minimal fatigue (on 2L t/o the session) and pt reporting feeling at her baseline.  She overall did well and agrees that she does not require further PT intervention.  PT will sign off.        If plan is discharge home, recommend the following:     Can travel by private vehicle        Equipment Recommendations None recommended by PT  Recommendations for Other Services       Functional Status Assessment Patient has not had a recent decline in their functional status     Precautions / Restrictions Precautions Precautions: None Restrictions Weight Bearing Restrictions: No      Mobility  Bed Mobility Overal bed mobility: Independent             General bed mobility comments: easily and confidently gets up to sitting EOB    Transfers Overall transfer level: Independent Equipment used: Rolling walker (2 wheels)               General transfer comment: Pt was able to rise to standing w/o issue, minimal reliance on the walker/UEs    Ambulation/Gait Ambulation/Gait assistance: Modified independent (Device/Increase time) Gait Distance (Feet): 300 Feet Assistive device: Rolling walker (2 wheels)         General Gait Details: Pt was able to easily and confidently circumabulate the nurses' station and had no LOBs or safety issues.  Pt reporting that she feels she is at her baseline, reports only  minimal fatigue with the effort.  Stairs Stairs: Yes Stairs assistance: Contact guard assist Stair Management: One rail Right, Alternating pattern Number of Stairs: 6    Wheelchair Mobility     Tilt Bed    Modified Rankin (Stroke Patients Only)       Balance Overall balance assessment: Modified Independent                                           Pertinent Vitals/Pain Pain Assessment Pain Assessment: No/denies pain    Home Living Family/patient expects to be discharged to:: Private residence Living Arrangements: Alone Available Help at Discharge: Family;Available 24 hours/day Type of Home: House Home Access: Stairs to enter Entrance Stairs-Rails: Right;Left;Can reach both Entrance Stairs-Number of Steps: 3   Home Layout: One level Home Equipment: Agricultural consultant (2 wheels);Cane - single point;BSC/3in1;Wheelchair - manual      Prior Function Prior Level of Function : Independent/Modified Independent;Driving             Mobility Comments: Reports no recent AD use; no falls for approx 2 years. ADLs Comments: (I) ADLs and IADLs, drives, does most housework. (I) medication management. Partner does lawn care and takes out the trash. States she enjoys sleeping.     Extremity/Trunk Assessment   Upper Extremity Assessment Upper Extremity Assessment: Overall WFL for tasks assessed    Lower Extremity  Assessment Lower Extremity Assessment: Overall WFL for tasks assessed       Communication   Communication Communication: No apparent difficulties  Cognition Arousal: Alert Behavior During Therapy: WFL for tasks assessed/performed Overall Cognitive Status: Within Functional Limits for tasks assessed                                          General Comments General comments (skin integrity, edema, etc.): Pt genreally did well, reports feeling at/near baseline and defers further PT f/u    Exercises     Assessment/Plan     PT Assessment Patient does not need any further PT services  PT Problem List         PT Treatment Interventions      PT Goals (Current goals can be found in the Care Plan section)  Acute Rehab PT Goals Patient Stated Goal: go home PT Goal Formulation: All assessment and education complete, DC therapy    Frequency       Co-evaluation               AM-PAC PT "6 Clicks" Mobility  Outcome Measure Help needed turning from your back to your side while in a flat bed without using bedrails?: None Help needed moving from lying on your back to sitting on the side of a flat bed without using bedrails?: None Help needed moving to and from a bed to a chair (including a wheelchair)?: None Help needed standing up from a chair using your arms (e.g., wheelchair or bedside chair)?: None Help needed to walk in hospital room?: None Help needed climbing 3-5 steps with a railing? : None 6 Click Score: 24    End of Session Equipment Utilized During Treatment: Gait belt Activity Tolerance: Patient tolerated treatment well Patient left: in chair;with call bell/phone within reach Nurse Communication: Mobility status      Time: 0865-7846 PT Time Calculation (min) (ACUTE ONLY): 20 min   Charges:   PT Evaluation $PT Eval Low Complexity: 1 Low PT Treatments $Gait Training: 8-22 mins PT General Charges $$ ACUTE PT VISIT: 1 Visit         Malachi Pro, DPT 06/17/2023, 10:45 AM

## 2023-06-18 ENCOUNTER — Inpatient Hospital Stay: Payer: 59 | Attending: Radiation Oncology

## 2023-06-18 DIAGNOSIS — J9622 Acute and chronic respiratory failure with hypercapnia: Secondary | ICD-10-CM | POA: Diagnosis not present

## 2023-06-18 DIAGNOSIS — J9621 Acute and chronic respiratory failure with hypoxia: Secondary | ICD-10-CM | POA: Diagnosis not present

## 2023-06-18 DIAGNOSIS — I5033 Acute on chronic diastolic (congestive) heart failure: Secondary | ICD-10-CM | POA: Diagnosis not present

## 2023-06-18 DIAGNOSIS — J441 Chronic obstructive pulmonary disease with (acute) exacerbation: Secondary | ICD-10-CM | POA: Diagnosis not present

## 2023-06-18 LAB — CBC
HCT: 25.7 % — ABNORMAL LOW (ref 36.0–46.0)
Hemoglobin: 7.9 g/dL — ABNORMAL LOW (ref 12.0–15.0)
MCH: 29.6 pg (ref 26.0–34.0)
MCHC: 30.7 g/dL (ref 30.0–36.0)
MCV: 96.3 fL (ref 80.0–100.0)
Platelets: 296 10*3/uL (ref 150–400)
RBC: 2.67 MIL/uL — ABNORMAL LOW (ref 3.87–5.11)
RDW: 14.8 % (ref 11.5–15.5)
WBC: 6.6 10*3/uL (ref 4.0–10.5)
nRBC: 0 % (ref 0.0–0.2)

## 2023-06-18 LAB — GLUCOSE, CAPILLARY
Glucose-Capillary: 68 mg/dL — ABNORMAL LOW (ref 70–99)
Glucose-Capillary: 184 mg/dL — ABNORMAL HIGH (ref 70–99)

## 2023-06-18 LAB — HOMOCYSTEINE: Homocysteine: 11.2 umol/L (ref 0.0–17.2)

## 2023-06-18 MED ORDER — LEVEMIR FLEXTOUCH 100 UNIT/ML ~~LOC~~ SOPN: 10.0000 [IU] | PEN_INJECTOR | Freq: Every day | SUBCUTANEOUS | Status: DC

## 2023-06-18 MED ORDER — PREDNISONE 20 MG PO TABS: 20.0000 mg | ORAL_TABLET | Freq: Every day | ORAL | 0 refills | Status: AC

## 2023-06-18 MED ORDER — INSULIN DETEMIR 100 UNIT/ML ~~LOC~~ SOLN
10.0000 [IU] | Freq: Every day | SUBCUTANEOUS | Status: DC
  Filled 2023-06-18: qty 0.1

## 2023-06-18 MED ORDER — CYANOCOBALAMIN 1000 MCG PO TABS: 1000.0000 ug | ORAL_TABLET | Freq: Every day | ORAL | 0 refills | Status: AC

## 2023-06-18 NOTE — TOC Transition Note (Signed)
Transition of Care [facility]) - CM/SW Discharge Note   Patient Details  Name: Angela Jensen MRN: [id number] Date of Birth: Apr 28, 1956  Transition of Care [facility]) CM/SW Contact:  Darolyn Rua, LCSW Phone Number: 06/18/2023, 9:36 AM   Clinical Narrative:     Patient to discharge home today, Mitch with [employer] aware. Reports that NIV will be set up at home today for patient.   No additional discharge needs identified.   Final next level of care: Home w Home Health Services Barriers to Discharge: No Barriers Identified   Patient Goals and CMS Choice CMS Medicare.gov Compare Post Acute Care list provided to:: Patient Choice offered to / list presented to : Patient  Discharge Placement                         Discharge Plan and Services Additional resources added to the After Visit Summary for                                       Social Determinants of Health (SDOH) Interventions SDOH Screenings   Food Insecurity: No Food Insecurity (06/15/2023)  Housing: Low Risk  (06/15/2023)  Transportation Needs: No Transportation Needs (06/15/2023)  Utilities: Not At Risk (06/15/2023)  Alcohol Screen: Low Risk  (12/11/2021)  Depression (PHQ2-9): Low Risk  (10/08/2022)  Tobacco Use: Medium Risk (06/15/2023)     Readmission Risk Interventions    06/13/2021    2:40 PM 05/05/2021   12:24 PM  Readmission Risk Prevention Plan  Transportation Screening Complete Complete  PCP or Specialist Appt within 5-7 Days  Complete  Home Care Screening  Complete  Medication Review (RN CM)  Complete  Social Work Consult for Recovery Care Planning/Counseling Complete   Palliative Care Screening Not Applicable   Medication Review Oceanographer) Complete

## 2023-06-18 NOTE — Care Management Important Message (Signed)
Important Message  Patient Details  Name: Angela Jensen MRN: [id number] Date of Birth: [birth year]   Important Message Given:  Yes - Medicare IM     [name] 06/18/2023, 12:11 PM

## 2023-06-18 NOTE — Discharge Summary (Signed)
Physician Discharge Summary   Patient: Angela Jensen MRN: [id number] DOB: 06-10-1956  Admit date:     06/14/2023  Discharge date: 06/18/23  Discharge Physician: Marrion Coy   PCP: Angela Kuster, NP   Recommendations at discharge:   Follow-up with PCP in 1 week. Follow-up on homocystine level.  Discharge Diagnoses: Principal Problem:   Acute on chronic respiratory failure with hypoxia and hypercapnia (HCC) Active Problems:   COPD exacerbation (HCC)   Acute on chronic diastolic CHF (congestive heart failure) (HCC)   Iron deficiency anemia   Cancer of lower lobe of right lung (HCC)   History of recurrent right pneumothorax   Transaminitis   Essential hypertension   Diabetes mellitus without complication (HCC)   PAD (peripheral artery disease) (HCC)   Normocytic anemia   Anxiety and depression   Overweight (BMI 25.0-29.9)  Resolved Problems:   * No resolved hospital problems. *  Hospital Course: 67 year old female past medical history of COPD on 4 L nasal cannula, right lung cancer s/p radiation therapy and recurrent pneumothorax, history of CHF, hypertension, diabetes and peripheral vascular disease.  She presented to the ER with respiratory distress requiring BiPAP.  With EMS she was hypoxic in the 80s.  She received Solu-Medrol and DuoNeb en route.  Patient condition was due to COPD exacerbation, she also had a significant CO2 retention.  She was treated with steroids, her condition is improved.  We also had a NIV set up for home use.  Will deliver to her today. She also chronic iron deficient anemia, she was getting IV iron infusion in the cancer clinic, she received 1 unit PRBC, she also given IV iron.  B12 borderline, pending homocystine.  Will continue B12 supplement until homocystine level is available.  Assessment and Plan: * Acute on chronic respiratory failure with hypoxia and hypercapnia (HCC) COPD exacerbation. With EMS had a pulse ox in the 80s.  Initially  required BiPAP for respiratory distress on presentation.  Respiratory failure is due to COPD exacerbation. Continue steroids, DuoNeb.  Add incentive spirometer. Condition improving. Due to chronic hypercapnia, patient had NIV set up. Currently, patient is back to baseline, medically stable for discharge.   Acute on chronic diastolic CHF (congestive heart failure) (HCC) Patient initially received IV Lasix, BNP 126.9. Volume status much better today.  Resumed home dose of diuretics.   Hypokalemia. Potassium normalized.     Iron deficiency anemia Globin 7.4 at time of admission, due to chronic congestive heart failure, patient received 1 unit PRBC.  Patient is a followed by hematology in the office and receiving IV iron regularly, she is due for another dose of IV iron, GC received on 10/31. B12 level borderline at 383, check homocystine level pending, start B12 supplement until homocystine level available.   Transaminitis Suspect related to hepatic congestion     History of recurrent right pneumothorax No pneumothorax seen on chest x-ray.   Cancer of lower lobe of right lung [facility]) Follow-up as outpatient.  Patient does have some pain on the right side.   Essential hypertension Continue Toprol   Diabetes mellitus type II uncontrolled with hyperglycemia alternating with hypoglycemia Intermittent hypoglycemia. Patient had episode of hypoglycemia while home regimen.  At this point, I have changed her Levemir to daily dose instead of at night, dose also reduced.  Follow-up with PCP to adjust dose.   PAD (peripheral artery disease) (HCC) Continue Plavix   Overweight (BMI 25.0-29.9) BMI 26.05.   Anxiety and depression Continue Lexapro and Xanax  Consultants: None Procedures performed: None  Disposition: Home Diet recommendation:  Discharge Diet Orders (From admission, onward)     Start     Ordered   06/18/23 0000  Diet - low sodium heart healthy         06/18/23 0937           Cardiac diet DISCHARGE MEDICATION: Allergies as of 06/18/2023       Reactions   Trelegy Ellipta [fluticasone-umeclidin-vilant]    Had breathing issues        Medication List     STOP taking these medications    fluticasone 50 MCG/ACT nasal spray Commonly known as: FLONASE   mupirocin ointment 2 % Commonly known as: BACTROBAN       TAKE these medications    Accu-Chek Guide test strip Generic drug: glucose blood USE TO TEST BLOOD SUGARS 5 TIMES DAILY   Accu-Chek Guide w/Device Kit Use as directed Dx e11.65   Accu-Chek Softclix Lancets lancets Use 1 lancet 3 times daily to check glucose for diabetes   albuterol 108 (90 Base) MCG/ACT inhaler Commonly known as: VENTOLIN HFA INHALE 2 PUFFS BY MOUTH EVERY 6 HOURS AS NEEDED FOR WHEEZING OR SHORTNESS OF BREATH   ALPRAZolam 0.25 MG tablet Commonly known as: XANAX TAKE 1 TABLET BY MOUTH THREE TIMES DAILY AS NEEDED FOR ANXIETY   calcium-vitamin D 500-5 MG-MCG tablet Commonly known as: OSCAL WITH D Take 1 tablet by mouth 2 (two) times daily.   clopidogrel 75 MG tablet Commonly known as: PLAVIX Take 1 tablet (75 mg total) by mouth daily.   cyanocobalamin 1000 MCG tablet Take 1 tablet (1,000 mcg total) by mouth daily for 14 days. Start taking on: June 19, 2023   cyclobenzaprine 10 MG tablet Commonly known as: FLEXERIL Take 1 tablet (10 mg total) by mouth at bedtime. Take one tab po qhs for back spasm prn only   Dexcom G7 Receiver Devi Use one as directed for uncontrolled dm   Dexcom G7 Sensor Misc Apply 1 sensor to skin on designated areas every 10 days to monitor glucose levels with sensor or smart phone app   escitalopram 10 MG tablet Commonly known as: LEXAPRO Take 1 tablet by mouth once daily   famotidine 20 MG tablet Commonly known as: PEPCID Take 1 tablet (20 mg total) by mouth daily for 14 days. What changed:  when to take this reasons to take this   ferrous  sulfate 325 (65 FE) MG tablet Take 325 mg by mouth 2 (two) times daily with a meal.   Fish Oil 1000 MG Caps Take 1 capsule by mouth daily.   furosemide 20 MG tablet Commonly known as: LASIX Take 1 tablet (20 mg total) by mouth daily as needed.   gabapentin 300 MG capsule Commonly known as: NEURONTIN Take 1 capsule (300 mg total) by mouth 3 (three) times daily.   HYDROcodone-acetaminophen 5-325 MG tablet Commonly known as: NORCO/VICODIN Take 1 tablet by mouth every 4 (four) hours as needed for moderate pain.   ipratropium-albuterol 0.5-2.5 (3) MG/3ML Soln Commonly known as: DUONEB Take 3 mLs by nebulization every 4 (four) hours as needed.   Levemir FlexTouch 100 UNIT/ML FlexTouch Pen Generic drug: insulin detemir Inject 10 Units into the skin daily. What changed:  how much to take when to take this   lisinopril-hydrochlorothiazide 20-25 MG tablet Commonly known as: ZESTORETIC Take 1 tablet by mouth daily.   metFORMIN 500 MG tablet Commonly known as: GLUCOPHAGE TAKE 2 TABLETS BY MOUTH  ONCE DAILY WITH BREAKFAST   metoprolol succinate 25 MG 24 hr tablet Commonly known as: TOPROL-XL Take 1 tablet (25 mg total) by mouth daily.   mometasone-formoterol 200-5 MCG/ACT Aero Commonly known as: DULERA Inhale 2 puffs into the lungs 2 (two) times daily.   Ohtuvayre 3 MG/2.5ML Susp Generic drug: Ensifentrine Inhale 3 mg into the lungs in the morning and at bedtime.   OXYGEN Inhale 4 L into the lungs. PT USES [facility] FOR OXYGEN   potassium chloride 10 MEQ tablet Commonly known as: KLOR-CON Take 1 tablet (10 mEq total) by mouth every other day.   predniSONE 20 MG tablet Commonly known as: DELTASONE Take 1 tablet (20 mg total) by mouth daily with breakfast for 3 days. Start taking on: June 19, 2023   ReliOn Pen Needles 31G X 6 MM Misc Generic drug: Insulin Pen Needle USE 1 PEN NEEDLE WITH INSULIN PEN ONCE DAILY FOR DIABETES   Spiriva HandiHaler 18 MCG inhalation  capsule Generic drug: tiotropium INHALE THE CONTENTS OF 1 CAPSULES BY MOUTH ONCE DAILY - DO NOT SWALLOW CAPSULES   VITAMIN D (CHOLECALCIFEROL) PO Take 1 tablet by mouth daily with supper.        Follow-up Information     Abernathy, Arlyss Repress, NP Follow up in 1 week(s).   Specialty: Nurse Practitioner Contact information: [address] [place] [address] [phone]                Discharge Exam: Filed Weights   06/16/23 0500 06/17/23 0334 06/18/23 0600  Weight: 53.8 kg 55.8 kg 56.5 kg   General exam: Appears calm and comfortable  Respiratory system: Decreased breath sounds. Respiratory effort normal. Cardiovascular system: S1 & S2 heard, RRR. No JVD, murmurs, rubs, gallops or clicks. No pedal edema. Gastrointestinal system: Abdomen is nondistended, soft and nontender. No organomegaly or masses felt. Normal bowel sounds heard. Central nervous system: Alert and oriented. No focal neurological deficits. Extremities: Symmetric 5 x 5 power. Skin: No rashes, lesions or ulcers Psychiatry: Judgement and insight appear normal. Mood & affect appropriate.    Condition at discharge: fair  The results of significant diagnostics from this hospitalization (including imaging, microbiology, ancillary and laboratory) are listed below for reference.   Imaging Studies: ECHOCARDIOGRAM COMPLETE  Result Date: 06/15/2023    ECHOCARDIOGRAM REPORT   Patient Name:   OLANDA BOUGHNER Date of Exam: 06/15/2023 Medical Rec #:  [id number]    Height:       59.0 in Accession #:    [id number]   Weight:       129.0 lb Date of Birth:  1956/04/13    BSA:          1.531 m Patient Age:    76 years     BP:           137/85 mmHg Patient Gender: F            HR:           89 bpm. Exam Location:  [facility] Procedure: 2D Echo, Cardiac Doppler and Color Doppler Indications:     CHF  History:         Patient has prior history of Echocardiogram examinations, most                  recent 04/03/2021. CHF, CAD, PAD and COPD,                   Arrythmias:Bradycardia, Signs/Symptoms:Chest Pain and Shortness  of Breath; Risk Factors:Hypertension, Diabetes, Dyslipidemia                  and Former Smoker. Lung CA.  Sonographer:     Mikki Harbor Referring Phys:  [id number] Andris Baumann Diagnosing Phys: Yvonne Kendall MD  Sonographer Comments: Technically challenging study due to limited acoustic windows and suboptimal apical window. IMPRESSIONS  1. Left ventricular ejection fraction, by estimation, is >55%. The left ventricle has normal function. Left ventricular endocardial border not optimally defined to evaluate regional wall motion. Left ventricular diastolic parameters are consistent with Grade I diastolic dysfunction (impaired relaxation).  2. Right ventricular systolic function is normal. The right ventricular size is normal. Tricuspid regurgitation signal is inadequate for assessing PA pressure.  3. The mitral valve was not well visualized. Trivial mitral valve regurgitation.  4. Tricuspid valve regurgitation is mild to moderate.  5. The aortic valve was not well visualized. Aortic valve regurgitation is not visualized. No aortic stenosis is present.  6. The inferior vena cava is normal in size with greater than 50% respiratory variability, suggesting right atrial pressure of 3 mmHg. FINDINGS  Left Ventricle: Left ventricular ejection fraction, by estimation, is >55%. The left ventricle has normal function. Left ventricular endocardial border not optimally defined to evaluate regional wall motion. The left ventricular internal cavity size was  normal in size. There is no left ventricular hypertrophy. Left ventricular diastolic parameters are consistent with Grade I diastolic dysfunction (impaired relaxation). Right Ventricle: The right ventricular size is normal. No increase in right ventricular wall thickness. Right ventricular systolic function is normal. Tricuspid regurgitation signal is inadequate for assessing  PA pressure. Left Atrium: Left atrial size was normal in size. Right Atrium: Right atrial size was normal in size. Pericardium: There is no evidence of pericardial effusion. Mitral Valve: The mitral valve was not well visualized. There is mild thickening of the mitral valve leaflet(s). Trivial mitral valve regurgitation. MV peak gradient, 5.8 mmHg. The mean mitral valve gradient is 2.0 mmHg. Tricuspid Valve: The tricuspid valve is grossly normal. Tricuspid valve regurgitation is mild to moderate. Aortic Valve: The aortic valve was not well visualized. Aortic valve regurgitation is not visualized. No aortic stenosis is present. Aortic valve mean gradient measures 3.0 mmHg. Aortic valve peak gradient measures 5.5 mmHg. Aortic valve area, by VTI measures 1.64 cm. Pulmonic Valve: The pulmonic valve was not well visualized. Pulmonic valve regurgitation is not visualized. No evidence of pulmonic stenosis. Aorta: The aortic root is normal in size and structure. Pulmonary Artery: The pulmonary artery is not well seen. Venous: The inferior vena cava is normal in size with greater than 50% respiratory variability, suggesting right atrial pressure of 3 mmHg. IAS/Shunts: The interatrial septum was not well visualized.  LEFT VENTRICLE PLAX 2D LVIDd:         4.00 cm   Diastology LVIDs:         2.80 cm   LV e' medial:    5.11 cm/s LV PW:         0.90 cm   LV E/e' medial:  10.2 LV IVS:        0.90 cm   LV e' lateral:   7.72 cm/s LVOT diam:     1.80 cm   LV E/e' lateral: 6.8 LV SV:         42 LV SV Index:   28 LVOT Area:     2.54 cm  RIGHT VENTRICLE RV Basal diam:  3.60 cm RV Mid  diam:    2.90 cm RV S prime:     10.70 cm/s TAPSE (M-mode): 2.6 cm LEFT ATRIUM           Index        RIGHT ATRIUM          Index LA diam:      2.90 cm 1.89 cm/m   RA Area:     6.81 cm LA Vol (A4C): 36.3 ml 23.74 ml/m  RA Volume:   12.45 ml 8.13 ml/m  AORTIC VALVE                    PULMONIC VALVE AV Area (Vmax):    1.70 cm     PV Vmax:       1.05  m/s AV Area (Vmean):   1.61 cm     PV Peak grad:  4.4 mmHg AV Area (VTI):     1.64 cm AV Vmax:           116.90 cm/s AV Vmean:          82.550 cm/s AV VTI:            0.258 m AV Peak Grad:      5.5 mmHg AV Mean Grad:      3.0 mmHg LVOT Vmax:         78.20 cm/s LVOT Vmean:        52.200 cm/s LVOT VTI:          0.166 m LVOT/AV VTI ratio: 0.64  AORTA Ao Root diam: 2.90 cm MITRAL VALVE MV Area (PHT): 4.89 cm    SHUNTS MV Area VTI:   2.00 cm    Systemic VTI:  0.17 m MV Peak grad:  5.8 mmHg    Systemic Diam: 1.80 cm MV Mean grad:  2.0 mmHg MV Vmax:       1.20 m/s MV Vmean:      65.3 cm/s MV Decel Time: 155 msec MV E velocity: 52.20 cm/s MV A velocity: 88.20 cm/s MV E/A ratio:  0.59 Cristal Deer End MD Electronically signed by Yvonne Kendall MD Signature Date/Time: 06/15/2023/6:43:33 PM    Final    DG Chest Portable 1 View  Result Date: 06/15/2023 CLINICAL DATA:  COPD exacerbation EXAM: PORTABLE CHEST 1 VIEW COMPARISON:  06/08/2023, CT chest 04/16/2023, CT 02/20/2022, chest x-ray 11/18/2022 FINDINGS: Hyperinflation with emphysema. Similar bandlike densities and mild distortion in the right lower lung most likely due to post therapeutic changes. Possible small right-sided pleural effusion. Stable cardiomediastinal silhouette. Right apical scarring. IMPRESSION: Hyperinflation with emphysema. Similar bandlike densities and mild distortion in the right lower lung most likely due to post therapeutic changes. Possible small right-sided pleural effusion. Electronically Signed   By: Jasmine Pang M.D.   On: 06/15/2023 03:02   DG Chest 2 View  Result Date: 06/08/2023 CLINICAL DATA:  shortness of breath; history of pneumothorax. history of lung cancer and copd and chf Primary malignant neoplasm of right lung.  Dyspnea. EXAM: CHEST - 2 VIEW COMPARISON:  Chest radiograph 04/21/2023, CT 04/16/2023 FINDINGS: No pneumothorax. Bandlike density in the right mid lung and ill-defined opacity in the right lower lung zone,  without significant interval change. No pulmonary edema or large pleural effusion. Stable heart size and mediastinal contours. Presumed lead less pacemaker. No acute osseous findings. IMPRESSION: 1. No pneumothorax. 2. Bandlike density in the right mid lung and ill-defined opacity in the right lower lung zone, without significant interval change. This may be post treatment  related change. 3. No acute radiographic abnormalities. Electronically Signed   By: Narda Rutherford M.D.   On: 06/08/2023 17:53   VAS [name] ABI WITH/WO TBI  Result Date: 06/03/2023  LOWER EXTREMITY DOPPLER STUDY Patient Name:  KATERI BALCH  Date of Exam:   06/03/2023 Medical Rec #: [id number]     Accession #:    [id number] Date of Birth: [birth year]     Patient Gender: F Patient Age:   85 years Exam [facility] Procedure:      VAS [name] ABI WITH/WO TBI Referring Phys: Festus Barren --------------------------------------------------------------------------------  Indications: Claudication, and peripheral artery disease. Other Factors: Left SFA stents are known to be occluded.                Patient complains of constant numbness in left foot.  Vascular Interventions: 06/01/16: Left distal SFA/popliteal arteries PTAs with                         SFA stent x2;                         06/08/16: Left SFA PTA;                         09/29/18: Left SFA stent x2;. Performing Technologist: Hardie Lora RVT  Examination Guidelines: A complete evaluation includes at minimum, Doppler waveform signals and systolic blood pressure reading at the level of bilateral brachial, anterior tibial, and posterior tibial arteries, when vessel segments are accessible. Bilateral testing is considered an integral part of a complete examination. Photoelectric Plethysmograph (PPG) waveforms and toe systolic pressure readings are included as required and additional duplex testing as needed. Limited examinations for reoccurring indications may be performed as  noted.  ABI Findings: +---------+------------------+-----+---------+--------+ Right    Rt Pressure (mmHg)IndexWaveform Comment  +---------+------------------+-----+---------+--------+ Brachial 123                                      +---------+------------------+-----+---------+--------+ PTA      101               0.82 biphasic          +---------+------------------+-----+---------+--------+ DP       110               0.89 triphasic         +---------+------------------+-----+---------+--------+ Great Toe97                0.79                   +---------+------------------+-----+---------+--------+ +---------+------------------+-----+----------+-------+ Left     Lt Pressure (mmHg)IndexWaveform  Comment +---------+------------------+-----+----------+-------+ Brachial 123                                      +---------+------------------+-----+----------+-------+ PTA      84                0.68 biphasic          +---------+------------------+-----+----------+-------+ DP       77                0.63 monophasic        +---------+------------------+-----+----------+-------+ Delorse Limber  0.37                   +---------+------------------+-----+----------+-------+ +-------+-----------+-----------+------------+------------+ ABI/TBIToday's ABIToday's TBIPrevious ABIPrevious TBI +-------+-----------+-----------+------------+------------+ Right  0.89       0.79       0.95        0.74         +-------+-----------+-----------+------------+------------+ Left   0.68       0.37       0.74        0.76         +-------+-----------+-----------+------------+------------+ Bilateral ABIs appear essentially unchanged compared to prior study on 05/26/2022.  Summary: Right: Resting right ankle-brachial index indicates mild right lower extremity arterial disease. The right toe-brachial index is normal. Left: Resting left ankle-brachial index  indicates moderate left lower extremity arterial disease. The left toe-brachial index is abnormal. *See table(s) above for measurements and observations.  Electronically signed by Levora Dredge MD on 06/03/2023 at 6:01:30 PM.    Final     Microbiology: Results for orders placed or performed during the hospital encounter of 06/14/23  SARS Coronavirus 2 by RT PCR (hospital order, performed in [facility] hospital lab) *cepheid single result test* Anterior Nasal Swab     Status: None   Collection Time: 06/14/23 11:46 PM   Specimen: Anterior Nasal Swab  Result Value Ref Range Status   SARS Coronavirus 2 by RT PCR NEGATIVE NEGATIVE Final    Comment: (NOTE) SARS-CoV-2 target nucleic acids are NOT DETECTED.  The SARS-CoV-2 RNA is generally detectable in upper and lower respiratory specimens during the acute phase of infection. The lowest concentration of SARS-CoV-2 viral copies this assay can detect is 250 copies / mL. A negative result does not preclude SARS-CoV-2 infection and should not be used as the sole basis for treatment or other patient management decisions.  A negative result may occur with improper specimen collection / handling, submission of specimen other than nasopharyngeal swab, presence of viral mutation(s) within the areas targeted by this assay, and inadequate number of viral copies (<250 copies / mL). A negative result must be combined with clinical observations, patient history, and epidemiological information.  Fact Sheet for Patients:   RoadLapTop.co.za  Fact Sheet for Healthcare Providers: http://kim-miller.com/  This test is not yet approved or  cleared by the [place] FDA and has been authorized for detection and/or diagnosis of SARS-CoV-2 by FDA under an Emergency Use Authorization (EUA).  This EUA will remain in effect (meaning this test can be used) for the duration of the COVID-19 declaration under Section  564(b)(1) of the Act, 21 U.S.C. section 360bbb-3(b)(1), unless the authorization is terminated or revoked sooner.  Performed at [facility], [address] [place], [place] [address]     Labs: CBC: Recent Labs  Lab 06/14/23 2346 06/16/23 0437 06/17/23 0549 06/18/23 0411  WBC 5.7 7.4 6.2 6.6  NEUTROABS 3.5  --   --   --   HGB 7.4* 9.0* 7.8* 7.9*  HCT 27.0* 28.5* 25.5* 25.7*  MCV 105.1* 94.4 95.9 96.3  PLT 331 339 320 296   Basic Metabolic Panel: Recent Labs  Lab 06/14/23 2346 06/16/23 0437 06/17/23 0549  NA 140 140 140  K 3.8 3.4* 3.6  CL 103 98 101  CO2 29 33* 32  GLUCOSE 144* 132* 153*  BUN 14 18 27*  CREATININE 0.54 0.50 0.57  CALCIUM 8.5* 8.7* 8.5*  MG 2.0  --  2.4   Liver Function Tests: Recent Labs  Lab  06/14/23 2346  AST 95*  ALT 67*  ALKPHOS 68  BILITOT 0.4  PROT 6.4*  ALBUMIN 3.6   CBG: Recent Labs  Lab 06/17/23 0845 06/17/23 1235 06/17/23 1627 06/17/23 2050 06/18/23 0722  GLUCAP 100* 180* 203* 88 68*    Discharge time spent: greater than 30 minutes.  Signed: Marrion Coy, MD [facility] 06/18/2023

## 2023-06-21 ENCOUNTER — Other Ambulatory Visit: Payer: 59

## 2023-06-23 ENCOUNTER — Ambulatory Visit: Payer: 59

## 2023-06-23 ENCOUNTER — Ambulatory Visit: Payer: 59 | Admitting: Oncology

## 2023-06-25 ENCOUNTER — Inpatient Hospital Stay: Payer: 59 | Attending: Oncology

## 2023-06-25 VITALS — BP 118/79 | HR 68 | Temp 97.8°F | Resp 18

## 2023-06-25 DIAGNOSIS — Z923 Personal history of irradiation: Secondary | ICD-10-CM | POA: Diagnosis not present

## 2023-06-25 DIAGNOSIS — Z85118 Personal history of other malignant neoplasm of bronchus and lung: Secondary | ICD-10-CM | POA: Diagnosis not present

## 2023-06-25 DIAGNOSIS — D472 Monoclonal gammopathy: Secondary | ICD-10-CM | POA: Diagnosis not present

## 2023-06-25 DIAGNOSIS — D509 Iron deficiency anemia, unspecified: Secondary | ICD-10-CM | POA: Insufficient documentation

## 2023-06-25 DIAGNOSIS — Z87891 Personal history of nicotine dependence: Secondary | ICD-10-CM | POA: Diagnosis not present

## 2023-06-25 DIAGNOSIS — D649 Anemia, unspecified: Secondary | ICD-10-CM

## 2023-06-25 DIAGNOSIS — J9611 Chronic respiratory failure with hypoxia: Secondary | ICD-10-CM | POA: Diagnosis not present

## 2023-06-25 MED ORDER — IRON SUCROSE 20 MG/ML IV SOLN
200.0000 mg | Freq: Once | INTRAVENOUS | Status: AC
  Administered 2023-06-25: 200 mg via INTRAVENOUS

## 2023-06-25 NOTE — Patient Instructions (Signed)
Iron Sucrose Injection What is this medication? IRON SUCROSE (EYE ern SOO krose) treats low levels of iron (iron deficiency anemia) in people with kidney disease. Iron is a mineral that plays an important role in making red blood cells, which carry oxygen from your lungs to the rest of your body. This medicine may be used for other purposes; ask your health care provider or pharmacist if you have questions. COMMON BRAND NAME(S): Venofer What should I tell my care team before I take this medication? They need to know if you have any of these conditions: Anemia not caused by low iron levels Heart disease High levels of iron in the blood Kidney disease Liver disease An unusual or allergic reaction to iron, other medications, foods, dyes, or preservatives Pregnant or trying to get pregnant Breastfeeding How should I use this medication? This medication is for infusion into a vein. It is given in a hospital or clinic setting. Talk to your care team about the use of this medication in children. While this medication may be prescribed for children as young as 2 years for selected conditions, precautions do apply. Overdosage: If you think you have taken too much of this medicine contact a poison control center or emergency room at once. NOTE: This medicine is only for you. Do not share this medicine with others. What if I miss a dose? Keep appointments for follow-up doses. It is important not to miss your dose. Call your care team if you are unable to keep an appointment. What may interact with this medication? Do not take this medication with any of the following: Deferoxamine Dimercaprol Other iron products This medication may also interact with the following: Chloramphenicol Deferasirox This list may not describe all possible interactions. Give your health care provider a list of all the medicines, herbs, non-prescription drugs, or dietary supplements you use. Also tell them if you smoke,  drink alcohol, or use illegal drugs. Some items may interact with your medicine. What should I watch for while using this medication? Visit your care team regularly. Tell your care team if your symptoms do not start to get better or if they get worse. You may need blood work done while you are taking this medication. You may need to follow a special diet. Talk to your care team. Foods that contain iron include: whole grains/cereals, dried fruits, beans, or peas, leafy green vegetables, and organ meats (liver, kidney). What side effects may I notice from receiving this medication? Side effects that you should report to your care team as soon as possible: Allergic reactions--skin rash, itching, hives, swelling of the face, lips, tongue, or throat Low blood pressure--dizziness, feeling faint or lightheaded, blurry vision Shortness of breath Side effects that usually do not require medical attention (report to your care team if they continue or are bothersome): Flushing Headache Joint pain Muscle pain Nausea Pain, redness, or irritation at injection site This list may not describe all possible side effects. Call your doctor for medical advice about side effects. You may report side effects to FDA at 1-800-FDA-1088. Where should I keep my medication? This medication is given in a hospital or clinic. It will not be stored at home. NOTE: This sheet is a summary. It may not cover all possible information. If you have questions about this medicine, talk to your doctor, pharmacist, or health care provider.  2024 Elsevier/Gold Standard (2023-01-08 00:00:00)

## 2023-06-28 ENCOUNTER — Ambulatory Visit (INDEPENDENT_AMBULATORY_CARE_PROVIDER_SITE_OTHER): Payer: 59 | Admitting: Physician Assistant

## 2023-06-28 ENCOUNTER — Encounter: Payer: Self-pay | Admitting: Physician Assistant

## 2023-06-28 DIAGNOSIS — Z09 Encounter for follow-up examination after completed treatment for conditions other than malignant neoplasm: Secondary | ICD-10-CM | POA: Diagnosis not present

## 2023-06-28 DIAGNOSIS — I10 Essential (primary) hypertension: Secondary | ICD-10-CM | POA: Diagnosis not present

## 2023-06-28 DIAGNOSIS — J449 Chronic obstructive pulmonary disease, unspecified: Secondary | ICD-10-CM | POA: Diagnosis not present

## 2023-06-28 DIAGNOSIS — J441 Chronic obstructive pulmonary disease with (acute) exacerbation: Secondary | ICD-10-CM

## 2023-06-28 MED ORDER — METOPROLOL SUCCINATE ER 25 MG PO TB24: 25.0000 mg | ORAL_TABLET | Freq: Every day | ORAL | 3 refills | Status: DC

## 2023-06-28 MED ORDER — ALBUTEROL SULFATE HFA 108 (90 BASE) MCG/ACT IN AERS: INHALATION_SPRAY | RESPIRATORY_TRACT | 1 refills | Status: DC

## 2023-06-28 MED ORDER — SPIRIVA HANDIHALER 18 MCG IN CAPS: ORAL_CAPSULE | RESPIRATORY_TRACT | 3 refills | Status: DC

## 2023-06-28 MED ORDER — IPRATROPIUM-ALBUTEROL 0.5-2.5 (3) MG/3ML IN SOLN: 3.0000 mL | RESPIRATORY_TRACT | 3 refills | Status: DC | PRN

## 2023-06-28 NOTE — Progress Notes (Signed)
[facility] [address] [place], [place] [address]  Internal MEDICINE  Office Visit Note  Patient Name: Angela Jensen  [id number]  [id number]  Date of Service: 07/02/2023     Chief Complaint  Patient presents with   Follow-up    Hospital f/u-copd     HPI Pt is here for recent hospital follow up. -Was in the hospital for COPD exacerbation. Admitted from 06/15/23-06/18/23 -per discharge, she went to ED in respiratory distress requiring bipap. With EMS hypoxic in 80s and was given solu-medrol and Duoneb. Throughout course was treated with steroids and improved and was ordered NIV setup for home. Also given IV lasix initially, but was able to resume home diuretic dose with potassium normalized -Taking B12 currently, also getting infusions for iron with hematology. Next on Friday. Has blood work on the following week. Did get 1 unit PRBC in hospital. -She reports she is doing much better now -She is on 3L oxygen continuously. Does wear PAP at night and feels more rested on this. -Has next CT chest in feb  Current Medication: Outpatient Encounter Medications as of 06/28/2023  Medication Sig   Accu-Chek Softclix Lancets lancets Use 1 lancet 3 times daily to check glucose for diabetes   ALPRAZolam (XANAX) 0.25 MG tablet TAKE 1 TABLET BY MOUTH THREE TIMES DAILY AS NEEDED FOR ANXIETY   Blood Glucose Monitoring Suppl (ACCU-CHEK GUIDE) w/Device KIT Use as directed Dx e11.65   calcium-vitamin D (OSCAL WITH D) 500-5 MG-MCG tablet Take 1 tablet by mouth 2 (two) times daily.   clopidogrel (PLAVIX) 75 MG tablet Take 1 tablet (75 mg total) by mouth daily.   Continuous Blood Gluc Receiver (DEXCOM G7 RECEIVER) DEVI Use one as directed for uncontrolled dm   Continuous Blood Gluc Sensor (DEXCOM G7 SENSOR) MISC Apply 1 sensor to skin on designated areas every 10 days to monitor glucose levels with sensor or smart phone app   cyanocobalamin 1000 MCG tablet Take 1 tablet (1,000 mcg total)  by mouth daily for 14 days.   cyclobenzaprine (FLEXERIL) 10 MG tablet Take 1 tablet (10 mg total) by mouth at bedtime. Take one tab po qhs for back spasm prn only   Ensifentrine (OHTUVAYRE) 3 MG/2.5ML SUSP Inhale 3 mg into the lungs in the morning and at bedtime.   escitalopram (LEXAPRO) 10 MG tablet Take 1 tablet by mouth once daily   ferrous sulfate 325 (65 FE) MG tablet Take 325 mg by mouth 2 (two) times daily with a meal.   furosemide (LASIX) 20 MG tablet Take 1 tablet (20 mg total) by mouth daily as needed.   gabapentin (NEURONTIN) 300 MG capsule Take 1 capsule (300 mg total) by mouth 3 (three) times daily.   glucose blood (ACCU-CHEK GUIDE) test strip USE TO TEST BLOOD SUGARS 5 TIMES DAILY   HYDROcodone-acetaminophen (NORCO/VICODIN) 5-325 MG tablet Take 1 tablet by mouth every 4 (four) hours as needed for moderate pain.   insulin detemir (LEVEMIR FLEXTOUCH) 100 UNIT/ML FlexPen Inject 10 Units into the skin daily.   Insulin Pen Needle (RELION PEN NEEDLES) 31G X 6 MM MISC USE 1 PEN NEEDLE WITH INSULIN PEN ONCE DAILY FOR DIABETES   lisinopril-hydrochlorothiazide (ZESTORETIC) 20-25 MG tablet Take 1 tablet by mouth daily.   metFORMIN (GLUCOPHAGE) 500 MG tablet TAKE 2 TABLETS BY MOUTH ONCE DAILY WITH BREAKFAST   mometasone-formoterol (DULERA) 200-5 MCG/ACT AERO Inhale 2 puffs into the lungs 2 (two) times daily.   Omega-3 Fatty Acids (FISH OIL) 1000 MG CAPS  Take 1 capsule by mouth daily.   OXYGEN Inhale 4 L into the lungs. PT USES [facility] FOR OXYGEN   potassium chloride (KLOR-CON) 10 MEQ tablet Take 1 tablet (10 mEq total) by mouth every other day.   VITAMIN D, CHOLECALCIFEROL, PO Take 1 tablet by mouth daily with supper.   [DISCONTINUED] albuterol (VENTOLIN HFA) 108 (90 Base) MCG/ACT inhaler INHALE 2 PUFFS BY MOUTH EVERY 6 HOURS AS NEEDED FOR WHEEZING OR SHORTNESS OF BREATH   [DISCONTINUED] ipratropium-albuterol (DUONEB) 0.5-2.5 (3) MG/3ML SOLN Take 3 mLs by nebulization every 4 (four) hours  as needed.   [DISCONTINUED] metoprolol succinate (TOPROL-XL) 25 MG 24 hr tablet Take 1 tablet (25 mg total) by mouth daily.   [DISCONTINUED] SPIRIVA HANDIHALER 18 MCG inhalation capsule INHALE THE CONTENTS OF 1 CAPSULES BY MOUTH ONCE DAILY - DO NOT SWALLOW CAPSULES   albuterol (VENTOLIN HFA) 108 (90 Base) MCG/ACT inhaler INHALE 2 PUFFS BY MOUTH EVERY 6 HOURS AS NEEDED FOR WHEEZING OR SHORTNESS OF BREATH   famotidine (PEPCID) 20 MG tablet Take 1 tablet (20 mg total) by mouth daily for 14 days. (Patient taking differently: Take 20 mg by mouth daily as needed for heartburn.)   ipratropium-albuterol (DUONEB) 0.5-2.5 (3) MG/3ML SOLN Take 3 mLs by nebulization every 4 (four) hours as needed.   metoprolol succinate (TOPROL-XL) 25 MG 24 hr tablet Take 1 tablet (25 mg total) by mouth daily.   SPIRIVA HANDIHALER 18 MCG inhalation capsule INHALE THE CONTENTS OF 1 CAPSULES BY MOUTH ONCE DAILY - DO NOT SWALLOW CAPSULES   No facility-administered encounter medications on file as of 06/28/2023.    Surgical History: Past Surgical History:  Procedure Laterality Date   CESAREAN SECTION     x3   COLONOSCOPY WITH PROPOFOL N/A 06/25/2015   Procedure: COLONOSCOPY WITH PROPOFOL;  Surgeon: Midge Minium, MD;  Location: [facility];  Service: Endoscopy;  Laterality: N/A;   COLONOSCOPY WITH PROPOFOL N/A 07/26/2020   Procedure: COLONOSCOPY WITH PROPOFOL;  Surgeon: Midge Minium, MD;  Location: [facility];  Service: Endoscopy;  Laterality: N/A;   CYST REMOVAL LEG     and on shoulder    ESOPHAGOGASTRODUODENOSCOPY (EGD) WITH PROPOFOL N/A 07/26/2020   Procedure: ESOPHAGOGASTRODUODENOSCOPY (EGD) WITH PROPOFOL;  Surgeon: Midge Minium, MD;  Location: [facility];  Service: Endoscopy;  Laterality: N/A;  Diabetic - oral meds   LOWER EXTREMITY ANGIOGRAPHY Left 09/29/2018   Procedure: LOWER EXTREMITY ANGIOGRAPHY;  Surgeon: Annice Needy, MD;  Location: [facility];  Service: Cardiovascular;  Laterality:  Left;   LUNG BIOPSY  12/30/2011   has lung "spots"   PACEMAKER IMPLANT  07/14/2021   PACEMAKER LEADLESS INSERTION N/A 07/14/2021   Procedure: PACEMAKER LEADLESS INSERTION;  Surgeon: Marcina Millard, MD;  Location: [facility];  Service: Cardiovascular;  Laterality: N/A;   PERIPHERAL VASCULAR CATHETERIZATION Left 06/01/2016   Procedure: Lower Extremity Angiography;  Surgeon: Annice Needy, MD;  Location: [facility];  Service: Cardiovascular;  Laterality: Left;   PERIPHERAL VASCULAR CATHETERIZATION N/A 06/01/2016   Procedure: Abdominal Aortogram w/Lower Extremity;  Surgeon: Annice Needy, MD;  Location: [facility];  Service: Cardiovascular;  Laterality: N/A;   PERIPHERAL VASCULAR CATHETERIZATION  06/01/2016   Procedure: Lower Extremity Intervention;  Surgeon: Annice Needy, MD;  Location: [facility];  Service: Cardiovascular;;   PERIPHERAL VASCULAR CATHETERIZATION Right 06/08/2016   Procedure: Lower Extremity Angiography;  Surgeon: Annice Needy, MD;  Location: [facility];  Service:  Cardiovascular;  Laterality: Right;   PERIPHERAL VASCULAR CATHETERIZATION  06/08/2016   Procedure: Lower Extremity Intervention;  Surgeon: Annice Needy, MD;  Location: [facility];  Service: Cardiovascular;;   SUBMANDIBULAR GLAND EXCISION Left 12/06/2020   Procedure: EXCISION SUBMANDIBULAR GLAND;  Surgeon: Linus Salmons, MD;  Location: [facility];  Service: ENT;  Laterality: Left;  needs to be first case Diabetic - diet controlled   TEMPORARY PACEMAKER N/A 07/11/2021   Procedure: TEMPORARY PACEMAKER;  Surgeon: Marcina Millard, MD;  Location: [facility];  Service: Cardiovascular;  Laterality: N/A;    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/2016   Cancer (HCC) 2012   Right Lung CA   COPD  (chronic obstructive pulmonary disease) (HCC)    Depression    Diabetes mellitus without complication (HCC)    Patient takes Janumet   Essential hypertension 05/26/2016   Heart failure (HCC) 2022   Hydropneumothorax 05/03/2020   Hypercholesteremia    Oxygen dependent    2L at nite    PAD (peripheral artery disease) (HCC) 06/22/2016   Peripheral vascular disease (HCC)    Personal history of radiation therapy    Shortness of breath dyspnea    with exertion    Sialolithiasis    Sleep apnea    Wears dentures    full upper and lower    Family History: Family History  Problem Relation Age of Onset   Diabetes Mother    Hypercholesterolemia Mother    Lung cancer Father    Diabetes Sister    Diabetes Sister    Hypertension Sister    Diabetes Maternal Grandmother    Diabetes Paternal Grandmother    Heart attack Brother    Coronary artery disease Brother    Vascular Disease Brother    Heart attack Brother    Breast cancer Neg Hx     Social History   Socioeconomic History   Marital status: Widowed    Spouse name: Not on file   Number of children: Not on file   Years of education: Not on file   Highest education level: Not on file  Occupational History   Not on file  Tobacco Use   Smoking status: Former    Current packs/day: 0.00    Average packs/day: 1 pack/day for 37.0 years (37.0 ttl pk-yrs)    Types: Cigarettes    Start date: 02/06/1973    Quit date: 02/06/2010    Years since quitting: 13.4   Smokeless tobacco: Former    Types: Snuff  Vaping Use   Vaping status: Never Used  Substance and Sexual Activity   Alcohol use: Not Currently    Alcohol/week: 5.0 standard drinks of alcohol    Types: 5 Cans of beer per week    Comment: /h x of alcohol abuse -stopped 2012- now drinks 5 beer per week   Drug use: Not Currently    Types: Marijuana, "Crack" cocaine, Cocaine    Comment: hx of cocaine use- last use 2015; last use marijuana6/22/19,   Sexual activity: Yes  Other  Topics Concern   Not on file  Social History Narrative   Lives with Significant Other x 43 years   Social Determinants of Health   Financial Resource Strain: Not on file  Food Insecurity: No Food Insecurity (06/15/2023)   Hunger Vital Sign  Worried About Programme researcher, broadcasting/film/video in the Last Year: Never true    Ran Out of Food in the Last Year: Never true  Transportation Needs: No Transportation Needs (06/15/2023)   PRAPARE - Administrator, Civil Service (Medical): No    Lack of Transportation (Non-Medical): No  Physical Activity: Not on file  Stress: Not on file  Social Connections: Not on file  Intimate Partner Violence: Not At Risk (06/15/2023)   Humiliation, Afraid, Rape, and Kick questionnaire    Fear of Current or Ex-Partner: No    Emotionally Abused: No    Physically Abused: No    Sexually Abused: No      Review of Systems  Constitutional:  Positive for fatigue. Negative for fever.  HENT:  Negative for congestion, mouth sores and postnasal drip.   Respiratory:  Negative for cough, shortness of breath and wheezing.   Cardiovascular:  Negative for chest pain.  Gastrointestinal:  Negative for abdominal pain.  Genitourinary:  Negative for flank pain.  Musculoskeletal:  Negative for myalgias.  Skin:  Negative for rash.  Psychiatric/Behavioral: Negative.  Negative for behavioral problems and self-injury.     Vital Signs: BP 109/88   Pulse (!) 104   Temp 97.8 F (36.6 C)   Resp 16   Ht 4\' 11"  (1.499 m)   Wt 121 lb (54.9 kg)   SpO2 94% Comment: 3 L  BMI 24.44 kg/m    Physical Exam Vitals reviewed.  Constitutional:      General: She is not in acute distress.    Appearance: Normal appearance. She is normal weight. She is not ill-appearing.  HENT:     Head: Normocephalic and atraumatic.  Eyes:     Pupils: Pupils are equal, round, and reactive to light.  Cardiovascular:     Rate and Rhythm: Normal rate and regular rhythm.  Pulmonary:     Effort:  Pulmonary effort is normal. No respiratory distress.  Skin:    General: Skin is warm and dry.  Neurological:     Mental Status: She is alert and oriented to person, place, and time.  Psychiatric:        Mood and Affect: Mood normal.        Behavior: Behavior normal.       Assessment/Plan: 1. Hospital discharge follow-up Reviewed hospital course, has follow up labs already with hematology next week - ipratropium-albuterol (DUONEB) 0.5-2.5 (3) MG/3ML SOLN; Take 3 mLs by nebulization every 4 (four) hours as needed.  Dispense: 360 mL; Refill: 3  2. Chronic obstructive pulmonary disease, unspecified COPD type (HCC) continue current inhalers - albuterol (VENTOLIN HFA) 108 (90 Base) MCG/ACT inhaler; INHALE 2 PUFFS BY MOUTH EVERY 6 HOURS AS NEEDED FOR WHEEZING OR SHORTNESS OF BREATH  Dispense: 9 g; Refill: 1 - SPIRIVA HANDIHALER 18 MCG inhalation capsule; INHALE THE CONTENTS OF 1 CAPSULES BY MOUTH ONCE DAILY - DO NOT SWALLOW CAPSULES  Dispense: 90 capsule; Refill: 3 - ipratropium-albuterol (DUONEB) 0.5-2.5 (3) MG/3ML SOLN; Take 3 mLs by nebulization every 4 (four) hours as needed.  Dispense: 360 mL; Refill: 3  3. Essential hypertension - metoprolol succinate (TOPROL-XL) 25 MG 24 hr tablet; Take 1 tablet (25 mg total) by mouth daily.  Dispense: 30 tablet; Refill: 3   General Counseling: Shamira verbalizes understanding of the findings of todays visit and agrees with plan of treatment. I have discussed any further diagnostic evaluation that may be needed or ordered today. We also reviewed her medications today. she has  been encouraged to call the office with any questions or concerns that should arise related to todays visit.    Counseling:    No orders of the defined types were placed in this encounter.   This patient was seen by Lynn Ito, PA-C in collaboration with Dr. Beverely Risen as a part of collaborative care agreement.   I have reviewed all medical records from hospital  follow up including radiology reports and consults from other physicians. Appropriate follow up diagnostics will be scheduled as needed. Patient/ Family understands the plan of treatment. Time spent35 minutes.   Dr Lyndon Code, MD Internal Medicine

## 2023-06-30 DIAGNOSIS — J449 Chronic obstructive pulmonary disease, unspecified: Secondary | ICD-10-CM | POA: Diagnosis not present

## 2023-06-30 DIAGNOSIS — J9610 Chronic respiratory failure, unspecified whether with hypoxia or hypercapnia: Secondary | ICD-10-CM | POA: Diagnosis not present

## 2023-07-01 ENCOUNTER — Ambulatory Visit: Payer: 59 | Admitting: Physician Assistant

## 2023-07-02 ENCOUNTER — Inpatient Hospital Stay: Payer: 59

## 2023-07-02 VITALS — BP 101/64 | HR 101 | Temp 97.0°F | Resp 18

## 2023-07-02 DIAGNOSIS — Z85118 Personal history of other malignant neoplasm of bronchus and lung: Secondary | ICD-10-CM | POA: Diagnosis not present

## 2023-07-02 DIAGNOSIS — J9611 Chronic respiratory failure with hypoxia: Secondary | ICD-10-CM | POA: Diagnosis not present

## 2023-07-02 DIAGNOSIS — Z923 Personal history of irradiation: Secondary | ICD-10-CM | POA: Diagnosis not present

## 2023-07-02 DIAGNOSIS — D509 Iron deficiency anemia, unspecified: Secondary | ICD-10-CM | POA: Diagnosis not present

## 2023-07-02 DIAGNOSIS — Z87891 Personal history of nicotine dependence: Secondary | ICD-10-CM | POA: Diagnosis not present

## 2023-07-02 DIAGNOSIS — D472 Monoclonal gammopathy: Secondary | ICD-10-CM | POA: Diagnosis not present

## 2023-07-02 DIAGNOSIS — D649 Anemia, unspecified: Secondary | ICD-10-CM

## 2023-07-02 MED ORDER — IRON SUCROSE 20 MG/ML IV SOLN
200.0000 mg | Freq: Once | INTRAVENOUS | Status: AC
  Administered 2023-07-02: 200 mg via INTRAVENOUS
  Filled 2023-07-02: qty 10

## 2023-07-02 NOTE — Patient Instructions (Signed)
Iron Sucrose Injection What is this medication? IRON SUCROSE (EYE ern SOO krose) treats low levels of iron (iron deficiency anemia) in people with kidney disease. Iron is a mineral that plays an important role in making red blood cells, which carry oxygen from your lungs to the rest of your body. This medicine may be used for other purposes; ask your health care provider or pharmacist if you have questions. COMMON BRAND NAME(S): Venofer What should I tell my care team before I take this medication? They need to know if you have any of these conditions: Anemia not caused by low iron levels Heart disease High levels of iron in the blood Kidney disease Liver disease An unusual or allergic reaction to iron, other medications, foods, dyes, or preservatives Pregnant or trying to get pregnant Breastfeeding How should I use this medication? This medication is for infusion into a vein. It is given in a hospital or clinic setting. Talk to your care team about the use of this medication in children. While this medication may be prescribed for children as young as 2 years for selected conditions, precautions do apply. Overdosage: If you think you have taken too much of this medicine contact a poison control center or emergency room at once. NOTE: This medicine is only for you. Do not share this medicine with others. What if I miss a dose? Keep appointments for follow-up doses. It is important not to miss your dose. Call your care team if you are unable to keep an appointment. What may interact with this medication? Do not take this medication with any of the following: Deferoxamine Dimercaprol Other iron products This medication may also interact with the following: Chloramphenicol Deferasirox This list may not describe all possible interactions. Give your health care provider a list of all the medicines, herbs, non-prescription drugs, or dietary supplements you use. Also tell them if you smoke,  drink alcohol, or use illegal drugs. Some items may interact with your medicine. What should I watch for while using this medication? Visit your care team regularly. Tell your care team if your symptoms do not start to get better or if they get worse. You may need blood work done while you are taking this medication. You may need to follow a special diet. Talk to your care team. Foods that contain iron include: whole grains/cereals, dried fruits, beans, or peas, leafy green vegetables, and organ meats (liver, kidney). What side effects may I notice from receiving this medication? Side effects that you should report to your care team as soon as possible: Allergic reactions--skin rash, itching, hives, swelling of the face, lips, tongue, or throat Low blood pressure--dizziness, feeling faint or lightheaded, blurry vision Shortness of breath Side effects that usually do not require medical attention (report to your care team if they continue or are bothersome): Flushing Headache Joint pain Muscle pain Nausea Pain, redness, or irritation at injection site This list may not describe all possible side effects. Call your doctor for medical advice about side effects. You may report side effects to FDA at 1-800-FDA-1088. Where should I keep my medication? This medication is given in a hospital or clinic. It will not be stored at home. NOTE: This sheet is a summary. It may not cover all possible information. If you have questions about this medicine, talk to your doctor, pharmacist, or health care provider.  2024 Elsevier/Gold Standard (2023-01-08 00:00:00)

## 2023-07-02 NOTE — Progress Notes (Signed)
Pt tolerated treatment without complaints.  VSS.  Pt refused 30 minute post observation.  Pt understands risks and understands to present to the ED with any signs or symptoms of reaction.

## 2023-07-06 ENCOUNTER — Inpatient Hospital Stay: Payer: 59

## 2023-07-06 DIAGNOSIS — D649 Anemia, unspecified: Secondary | ICD-10-CM

## 2023-07-06 DIAGNOSIS — Z923 Personal history of irradiation: Secondary | ICD-10-CM | POA: Diagnosis not present

## 2023-07-06 DIAGNOSIS — Z87891 Personal history of nicotine dependence: Secondary | ICD-10-CM | POA: Diagnosis not present

## 2023-07-06 DIAGNOSIS — J9611 Chronic respiratory failure with hypoxia: Secondary | ICD-10-CM | POA: Diagnosis not present

## 2023-07-06 DIAGNOSIS — D509 Iron deficiency anemia, unspecified: Secondary | ICD-10-CM | POA: Diagnosis not present

## 2023-07-06 DIAGNOSIS — D472 Monoclonal gammopathy: Secondary | ICD-10-CM | POA: Diagnosis not present

## 2023-07-06 DIAGNOSIS — Z85118 Personal history of other malignant neoplasm of bronchus and lung: Secondary | ICD-10-CM | POA: Diagnosis not present

## 2023-07-06 LAB — CBC WITH DIFFERENTIAL (CANCER CENTER ONLY)
Abs Immature Granulocytes: 0.02 10*3/uL (ref 0.00–0.07)
Basophils Absolute: 0 10*3/uL (ref 0.0–0.1)
Basophils Relative: 1 %
Eosinophils Absolute: 0.1 10*3/uL (ref 0.0–0.5)
Eosinophils Relative: 2 %
HCT: 31.9 % — ABNORMAL LOW (ref 36.0–46.0)
Hemoglobin: 9.4 g/dL — ABNORMAL LOW (ref 12.0–15.0)
Immature Granulocytes: 0 %
Lymphocytes Relative: 11 %
Lymphs Abs: 0.5 10*3/uL — ABNORMAL LOW (ref 0.7–4.0)
MCH: 29.2 pg (ref 26.0–34.0)
MCHC: 29.5 g/dL — ABNORMAL LOW (ref 30.0–36.0)
MCV: 99.1 fL (ref 80.0–100.0)
Monocytes Absolute: 0.4 10*3/uL (ref 0.1–1.0)
Monocytes Relative: 8 %
Neutro Abs: 3.8 10*3/uL (ref 1.7–7.7)
Neutrophils Relative %: 78 %
Platelet Count: 282 10*3/uL (ref 150–400)
RBC: 3.22 MIL/uL — ABNORMAL LOW (ref 3.87–5.11)
RDW: 15 % (ref 11.5–15.5)
WBC Count: 4.8 10*3/uL (ref 4.0–10.5)
nRBC: 0 % (ref 0.0–0.2)

## 2023-07-06 LAB — CMP (CANCER CENTER ONLY)
ALT: 27 U/L (ref 0–44)
AST: 24 U/L (ref 15–41)
Albumin: 3.9 g/dL (ref 3.5–5.0)
Alkaline Phosphatase: 78 U/L (ref 38–126)
Anion gap: 10 (ref 5–15)
BUN: 22 mg/dL (ref 8–23)
CO2: 32 mmol/L (ref 22–32)
Calcium: 9 mg/dL (ref 8.9–10.3)
Chloride: 99 mmol/L (ref 98–111)
Creatinine: 0.84 mg/dL (ref 0.44–1.00)
GFR, Estimated: >60 mL/min (ref >60)
Glucose, Bld: 215 mg/dL — ABNORMAL HIGH (ref 70–99)
Potassium: 3.5 mmol/L (ref 3.5–5.1)
Sodium: 141 mmol/L (ref 135–145)
Total Bilirubin: 0.4 mg/dL (ref <1.2)
Total Protein: 6.9 g/dL (ref 6.5–8.1)

## 2023-07-06 LAB — VITAMIN B12: Vitamin B-12: 3103 pg/mL — ABNORMAL HIGH (ref 180–914)

## 2023-07-07 DIAGNOSIS — R0602 Shortness of breath: Secondary | ICD-10-CM | POA: Diagnosis not present

## 2023-07-07 DIAGNOSIS — I10 Essential (primary) hypertension: Secondary | ICD-10-CM | POA: Diagnosis not present

## 2023-07-07 DIAGNOSIS — J449 Chronic obstructive pulmonary disease, unspecified: Secondary | ICD-10-CM | POA: Diagnosis not present

## 2023-07-08 ENCOUNTER — Inpatient Hospital Stay: Payer: 59

## 2023-07-08 ENCOUNTER — Encounter: Payer: Self-pay | Admitting: Oncology

## 2023-07-08 ENCOUNTER — Inpatient Hospital Stay: Payer: 59 | Admitting: Oncology

## 2023-07-08 VITALS — BP 123/84 | HR 94

## 2023-07-08 VITALS — BP 125/74 | HR 107 | Temp 97.4°F | Resp 18 | Wt 122.5 lb

## 2023-07-08 DIAGNOSIS — D472 Monoclonal gammopathy: Secondary | ICD-10-CM

## 2023-07-08 DIAGNOSIS — D508 Other iron deficiency anemias: Secondary | ICD-10-CM | POA: Diagnosis not present

## 2023-07-08 DIAGNOSIS — C3491 Malignant neoplasm of unspecified part of right bronchus or lung: Secondary | ICD-10-CM | POA: Diagnosis not present

## 2023-07-08 DIAGNOSIS — Z87891 Personal history of nicotine dependence: Secondary | ICD-10-CM | POA: Diagnosis not present

## 2023-07-08 DIAGNOSIS — Z923 Personal history of irradiation: Secondary | ICD-10-CM | POA: Diagnosis not present

## 2023-07-08 DIAGNOSIS — J9611 Chronic respiratory failure with hypoxia: Secondary | ICD-10-CM | POA: Diagnosis not present

## 2023-07-08 DIAGNOSIS — Z85118 Personal history of other malignant neoplasm of bronchus and lung: Secondary | ICD-10-CM | POA: Diagnosis not present

## 2023-07-08 DIAGNOSIS — D509 Iron deficiency anemia, unspecified: Secondary | ICD-10-CM | POA: Diagnosis not present

## 2023-07-08 DIAGNOSIS — D649 Anemia, unspecified: Secondary | ICD-10-CM

## 2023-07-08 MED ORDER — SODIUM CHLORIDE 0.9% FLUSH
10.0000 mL | INTRAVENOUS | Status: DC | PRN
Start: 2023-07-08 — End: 2023-07-08
  Administered 2023-07-08: 10 mL via INTRAVENOUS
  Filled 2023-07-08: qty 10

## 2023-07-08 MED ORDER — IRON SUCROSE 20 MG/ML IV SOLN
200.0000 mg | Freq: Once | INTRAVENOUS | Status: AC
  Administered 2023-07-08: 200 mg via INTRAVENOUS
  Filled 2023-07-08: qty 10

## 2023-07-08 NOTE — Assessment & Plan Note (Addendum)
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 -Recurrence-SBRT completed 06/11/2020.NGS showed TMB >10 muts/mb, MS stable, KRAS G12V, MYC amplification,STK11 S257fs*47, FUBP1 splice sit 473+1G>T August 2024 CT scan results reviewed and discussed with patient New 4mm lung nodule apical segment right upper lobe nodule. Nodule is too small for PET evaluation or biopsy.  Recommend attention on follow up  Recommend continued surveillance CT scan in 3months.

## 2023-07-08 NOTE — Progress Notes (Signed)
Hematology/Oncology Progress note [phone] [phone]     Chief Complaint: Angela Jensen is a 67 y.o. 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 -Recurrence-SBRT completed 06/11/2020.NGS showed TMB >10 muts/mb, MS stable, KRAS G12V, MYC amplification,STK11 S261fs*47, FUBP1 splice sit 473+1G>T August 2024 CT scan results reviewed and discussed with patient New 4mm lung nodule apical segment right upper lobe nodule. Nodule is too small for PET evaluation or biopsy.  Recommend attention on follow up  Recommend continued surveillance CT scan in 3months.  IDA (iron deficiency anemia) Lab Results  Component Value Date   HGB 9.4 (L) 07/06/2023   TIBC 368 06/15/2023   IRONPCTSAT 7 (L) 06/15/2023   FERRITIN 22 06/15/2023    She tolerated IV Venofer.  Proceed with Venofer 200mg  x 1 today.  After that continue oral ferrous sulfate 325mg  BID  Chronic respiratory failure with hypoxia (HCC) Stable.  Continue oxygen supplementation.  MGUS (monoclonal gammopathy of unknown significance) IgG kappa MGUS, SPEP M protien 0.2.  I discussed with patient about the diagnosis of IgG MGUS which is an asymptomatic condition which has a small risk of progression to smoldering multiple myeloma and to symptomatic multiple myeloma. Less frequently, these patients progress to AL amyloidosis, light chain deposition disease, or another lymphoproliferative disorder. For now I recommend observation. Check SPEP and light chain ratio every 6 months.    Orders Placed This Encounter  Procedures   CBC with Differential (Cancer Center Only)    Standing Status:   Future    Standing Expiration Date:   07/07/2024   Ferritin    Standing Status:    Future    Standing Expiration Date:   07/07/2024   Iron and TIBC    Standing Status:   Future    Standing Expiration Date:   07/07/2024   Kappa/lambda light chains    Standing Status:   Future    Standing Expiration Date:   07/07/2024   Multiple Myeloma Panel (SPEP&IFE w/QIG)    Standing Status:   Future    Standing Expiration Date:   07/07/2024   Retic Panel    Standing Status:   Future    Standing Expiration Date:   07/07/2024   Follow-up in 3 months All questions were answered. The patient knows to call the clinic with any problems, questions or concerns.  Rickard Patience, MD, PhD [facility] Hematology Oncology 07/08/2023    PERTINENT ONCOLOGY HISTORY Patient previously followed up by Dr.Corcoran, patient switched care to me on 03/20/21 Extensive medical record review was performed by me  # Stage I RUL adenocarcinoma  s/p IMRT in 02/2012.   She was not a good surgery candidate secondary to her lung function (FEV1 28%).  CT guided biopsy of the superior and inferior RUL nodules on 12/30/2011 revealed well differentiated adenocarcinoma of lung primary.  EGFR testing revealed no mutation.  She received IMRT 6000 cGy to both nodules from 01/26/2012 - 03/10/2012.   In 04/2015, there was an attempt at radiofrequency ablation of 1 enlarging RUL nodule.  At the time of the procedure, the nodule had shrunk to 5 mm from 9 mm.     # Stage I RLL adenocarcinoma 2019   CT guided RLL biopsy on 02/09/2018 revealed adenocarcinoma of lung origin, lepidic and  papillary patterns.   She received 6000 cGy in 5 fractions to RLL nodule from 03/22/2018 - 04/05/2018.   Chest CT on 03/04/2020 revealed enlarging and new pulmonary nodules suspicious for metastatic disease or metachronous primary.  PET scan on 03/19/2020 revealed that the right lower lobe pulmonary nodules did not demonstrate any hypermetabolism to suggest neoplasm. However, these were somewhat worrisome; recommendation was close CT surveillance.  There was no mediastinal or hilar lymphadenopathy.  Chest CT  on 09/11/2020 revealed interval treatment of the right lower lobe nodules. These nodules were partly obscured by adjacent parenchymal opacity, although appear decreased in size.  CT guided RLL biopsy on 04/24/2020 revealed adenocarcinoma of the lung with papillary and solid patterns. 05/28/2020 - 06/11/2020  SBRT to the right lung. She received 60 Gy in 5 fractions.  She has an elevated CEA of unclear etiology.  CEA was 8.1 on 01/31/2016, 9.2 on 03/03/2016, 7.9 on 10/12/2017, and 7.9 on 01/19/2019.   Colonoscopy on 06/25/2015 revealed a 4 mm polyp in the sigmoid colon.  Pathology revealed a tubular adenoma without evidence of dysplasia or malignancy.   Pelvic MRI on 04/30/2016 revealed a 3.4 x 2.5 cm ovoid mass in the LEFT adnexa with signal characteristics identical to the intramural leiomyomas and consistent with an exophytic leiomyoma.  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  Endometrium was normal with a normal junctional zone.  Ovaries were not identified.  # iron deficiency anemia.  She required 2 units of PRBCs on 04/04/2020.  Colonoscopy on 06/25/2015 revealed one 4 mm polyp (tubular adenoma) in the sigmoid colon. There were non-bleeding internal hemorrhoids.  Colonoscopy on 07/26/2020 revealed diverticulosis in the entire examined colon. There were non-bleeding internal hemorrhoids. EGD on 07/26/2020 revealed erosive gastropathy with no bleeding and no stigmata of recent bleeding.   07/11/2021, patient was hospitalized and a CT angio chest PE with contrast was ordered and there was slight increase in size".  3 over several months of larger than 1 cm nodular-like densities along the right minor and major fissure in the region of prior radiation therapy.  Recurrent malignancy cannot be excluded.   12/29 2022, a PET scan was obtained for further evaluation.  PET scan showed stable scarring type changes involving the right lung.  No  hypermetabolic pulmonary lesions to suggest recurrent or metastatic tumor.  02/20/2022, CT chest with contrast showed continued interval progression of the consolidative plaque-like opacity in the right lower lobe.  This area was previously FDG negative on PET scan in December 2022.  New 5 mm left lower lobe pulmonary nodule.  09/21/2022, CT chest with contrast showed 1. Nondisplaced mildly-comminuted fracture of the posterolateral right ninth rib. 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 No pneumothorax. 2. Stable examination demonstrating chronic postradiation changes in the right lung with no definitive evidence to suggest locally recurrent disease or definite metastatic disease in the thorax.  3. Diffuse bronchial wall thickening with moderate centrilobular and paraseptal emphysema; imaging findings suggestive of underlying COPD. 4. Aortic atherosclerosis,  03/24/2023 CT chest w contrast  1. New 4 mm apical segment right upper lobe nodule, worrisome for disease progression. 2. Pathologic right rib fractures, as before, possibly radiation related. 3. Aortic atherosclerosis (ICD10-I70.0). Coronary artery calcification. 4.  Emphysema  INTERVAL HISTORY Angela Jensen is a 67 y.o. female who has above history reviewed by me today presents for follow up visit for iron deficiency anemia, history of lung cancer  She reports feeling well, chronic SOB, on nasal cannula oxygen.  Tolerated IV Venofer treatments.  She also takes oral iron supplementation twice daily.    Past Medical History:  Diagnosis Date   Anemia    Arthritis    Asthma    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/2016   Cancer (HCC) 2012   Right Lung CA   COPD (chronic obstructive pulmonary disease) (HCC)    Depression    Diabetes mellitus without complication (HCC)    Patient  takes Janumet   Essential hypertension 05/26/2016   Heart failure (HCC) 2022   Hydropneumothorax 05/03/2020   Hypercholesteremia    Oxygen dependent    2L at nite    PAD (peripheral artery disease) (HCC) 06/22/2016   Peripheral vascular disease (HCC)    Personal history of radiation therapy    Shortness of breath dyspnea    with exertion    Sialolithiasis    Sleep apnea    Wears dentures    full upper and lower    Past Surgical History:  Procedure Laterality Date   CESAREAN SECTION     x3   COLONOSCOPY WITH PROPOFOL N/A 06/25/2015   Procedure: COLONOSCOPY WITH PROPOFOL;  Surgeon: Midge Minium, MD;  Location: [facility];  Service: Endoscopy;  Laterality: N/A;   COLONOSCOPY WITH PROPOFOL N/A 07/26/2020   Procedure: COLONOSCOPY WITH PROPOFOL;  Surgeon: Midge Minium, MD;  Location: [facility];  Service: Endoscopy;  Laterality: N/A;   CYST REMOVAL LEG     and on shoulder    ESOPHAGOGASTRODUODENOSCOPY (EGD) WITH PROPOFOL N/A 07/26/2020   Procedure: ESOPHAGOGASTRODUODENOSCOPY (EGD) WITH PROPOFOL;  Surgeon: Midge Minium, MD;  Location: [facility];  Service: Endoscopy;  Laterality: N/A;  Diabetic - oral meds   LOWER EXTREMITY ANGIOGRAPHY Left 09/29/2018   Procedure: LOWER EXTREMITY ANGIOGRAPHY;  Surgeon: Annice Needy, MD;  Location: [facility];  Service: Cardiovascular;  Laterality: Left;   LUNG BIOPSY  12/30/2011   has lung "spots"   PACEMAKER IMPLANT  07/14/2021   PACEMAKER LEADLESS INSERTION N/A 07/14/2021   Procedure: PACEMAKER LEADLESS INSERTION;  Surgeon: Marcina Millard, MD;  Location: [facility];  Service: Cardiovascular;  Laterality: N/A;   PERIPHERAL VASCULAR CATHETERIZATION Left 06/01/2016   Procedure: Lower Extremity Angiography;  Surgeon: Annice Needy, MD;  Location: [facility];  Service: Cardiovascular;  Laterality: Left;   PERIPHERAL VASCULAR CATHETERIZATION N/A 06/01/2016   Procedure: Abdominal Aortogram w/Lower  Extremity;  Surgeon: Annice Needy, MD;  Location: [facility];  Service: Cardiovascular;  Laterality: N/A;   PERIPHERAL VASCULAR CATHETERIZATION  06/01/2016   Procedure: Lower Extremity Intervention;  Surgeon: Annice Needy, MD;  Location: [facility];  Service: Cardiovascular;;   PERIPHERAL VASCULAR CATHETERIZATION Right 06/08/2016   Procedure: Lower Extremity Angiography;  Surgeon: Annice Needy, MD;  Location: [facility];  Service: Cardiovascular;  Laterality: Right;   PERIPHERAL VASCULAR CATHETERIZATION  06/08/2016   Procedure: Lower Extremity Intervention;  Surgeon: Annice Needy, MD;  Location: [facility];  Service: Cardiovascular;;   SUBMANDIBULAR GLAND EXCISION Left 12/06/2020   Procedure: EXCISION SUBMANDIBULAR GLAND;  Surgeon: Linus Salmons, MD;  Location: [facility];  Service: ENT;  Laterality: Left;  needs to be first case Diabetic - diet controlled   TEMPORARY PACEMAKER N/A 07/11/2021   Procedure: TEMPORARY PACEMAKER;  Surgeon: Marcina Millard, MD;  Location: [facility];  Service: Cardiovascular;  Laterality:  N/A;    Family History  Problem Relation Age of Onset   Diabetes Mother    Hypercholesterolemia Mother    Lung cancer Father    Diabetes Sister    Diabetes Sister    Hypertension Sister    Diabetes Maternal Grandmother    Diabetes Paternal Grandmother    Heart attack Brother    Coronary artery disease Brother    Vascular Disease Brother    Heart attack Brother    Breast cancer Neg Hx     Social History:  reports that she quit smoking about 13 years ago. Her smoking use included cigarettes. She started smoking about 50 years ago. She has a 37 pack-year smoking history. She has quit using smokeless tobacco.  Her smokeless tobacco use included snuff. She reports that she does not currently use alcohol after a past usage of about 5.0 standard drinks of alcohol per week. She reports that she does not currently use  drugs after having used the following drugs: Marijuana, "Crack" cocaine, and Cocaine.   Allergies:  Allergies  Allergen Reactions   Trelegy Ellipta [Fluticasone-Umeclidin-Vilant]     Had breathing issues    Current Medications: Current Outpatient Medications  Medication Sig Dispense Refill   Accu-Chek Softclix Lancets lancets Use 1 lancet 3 times daily to check glucose for diabetes 300 each 1   albuterol (VENTOLIN HFA) 108 (90 Base) MCG/ACT inhaler INHALE 2 PUFFS BY MOUTH EVERY 6 HOURS AS NEEDED FOR WHEEZING OR SHORTNESS OF BREATH 9 g 1   ALPRAZolam (XANAX) 0.25 MG tablet TAKE 1 TABLET BY MOUTH THREE TIMES DAILY AS NEEDED FOR ANXIETY 60 tablet 2   Blood Glucose Monitoring Suppl (ACCU-CHEK GUIDE) w/Device KIT Use as directed Dx e11.65 1 kit 0   calcium-vitamin D (OSCAL WITH D) 500-5 MG-MCG tablet Take 1 tablet by mouth 2 (two) times daily. 100 tablet 0   clopidogrel (PLAVIX) 75 MG tablet Take 1 tablet (75 mg total) by mouth daily. 90 tablet 3   Continuous Blood Gluc Receiver (DEXCOM G7 RECEIVER) DEVI Use one as directed for uncontrolled dm 1 each 0   Continuous Blood Gluc Sensor (DEXCOM G7 SENSOR) MISC Apply 1 sensor to skin on designated areas every 10 days to monitor glucose levels with sensor or smart phone app 3 each 11   cyclobenzaprine (FLEXERIL) 10 MG tablet Take 1 tablet (10 mg total) by mouth at bedtime. Take one tab po qhs for back spasm prn only 30 tablet 3   Ensifentrine (OHTUVAYRE) 3 MG/2.5ML SUSP Inhale 3 mg into the lungs in the morning and at bedtime. 150 mL 11   escitalopram (LEXAPRO) 10 MG tablet Take 1 tablet by mouth once daily 90 tablet 1   famotidine (PEPCID) 20 MG tablet Take 1 tablet (20 mg total) by mouth daily for 14 days. (Patient taking differently: Take 20 mg by mouth daily as needed for heartburn.) 14 tablet 0   ferrous sulfate 325 (65 FE) MG tablet Take 325 mg by mouth 2 (two) times daily with a meal.     furosemide (LASIX) 20 MG tablet Take 1 tablet (20 mg  total) by mouth daily as needed. 30 tablet 5   gabapentin (NEURONTIN) 300 MG capsule Take 1 capsule (300 mg total) by mouth 3 (three) times daily. 90 capsule 3   glucose blood (ACCU-CHEK GUIDE) test strip USE TO TEST BLOOD SUGARS 5 TIMES DAILY 450 each 0   HYDROcodone-acetaminophen (NORCO/VICODIN) 5-325 MG tablet Take 1 tablet by mouth every 4 (four) hours  as needed for moderate pain. 30 tablet 0   insulin detemir (LEVEMIR FLEXTOUCH) 100 UNIT/ML FlexPen Inject 10 Units into the skin daily.     Insulin Pen Needle (RELION PEN NEEDLES) 31G X 6 MM MISC USE 1 PEN NEEDLE WITH INSULIN PEN ONCE DAILY FOR DIABETES 50 each 3   ipratropium-albuterol (DUONEB) 0.5-2.5 (3) MG/3ML SOLN Take 3 mLs by nebulization every 4 (four) hours as needed. 360 mL 3   lisinopril-hydrochlorothiazide (ZESTORETIC) 20-25 MG tablet Take 1 tablet by mouth daily.     metFORMIN (GLUCOPHAGE) 500 MG tablet TAKE 2 TABLETS BY MOUTH ONCE DAILY WITH BREAKFAST 180 tablet 1   metoprolol succinate (TOPROL-XL) 25 MG 24 hr tablet Take 1 tablet (25 mg total) by mouth daily. 30 tablet 3   mometasone-formoterol (DULERA) 200-5 MCG/ACT AERO Inhale 2 puffs into the lungs 2 (two) times daily. 13 g 11   Omega-3 Fatty Acids (FISH OIL) 1000 MG CAPS Take 1 capsule by mouth daily.     OXYGEN Inhale 4 L into the lungs. PT USES [facility] FOR OXYGEN     potassium chloride (KLOR-CON) 10 MEQ tablet Take 1 tablet (10 mEq total) by mouth every other day. 45 tablet 2   SPIRIVA HANDIHALER 18 MCG inhalation capsule INHALE THE CONTENTS OF 1 CAPSULES BY MOUTH ONCE DAILY - DO NOT SWALLOW CAPSULES 90 capsule 3   VITAMIN D, CHOLECALCIFEROL, PO Take 1 tablet by mouth daily with supper.     No current facility-administered medications for this visit.    Review of Systems  Constitutional:  Negative for chills, diaphoresis, fever, malaise/fatigue and weight loss.  HENT:  Negative for congestion, ear discharge, ear pain, hearing loss, nosebleeds, sinus pain, sore throat  and tinnitus.   Eyes: Negative.  Negative for blurred vision.  Respiratory:  Negative for cough, hemoptysis, sputum production and shortness of breath.        Patient is on nasal cannula oxygen  Cardiovascular:  Negative for chest pain, palpitations and leg swelling.  Gastrointestinal: Negative.  Negative for abdominal pain, blood in stool, constipation, diarrhea, heartburn, melena, nausea and vomiting.  Genitourinary: Negative.  Negative for dysuria, frequency, hematuria and urgency.  Musculoskeletal:  Negative for back pain, joint pain, myalgias and neck pain.  Skin: Negative.  Negative for itching and rash.  Neurological: Negative.  Negative for dizziness, tingling, sensory change, speech change, focal weakness, weakness and headaches.  Endo/Heme/Allergies: Negative.  Does not bruise/bleed easily.       Diabetes, under good control  Psychiatric/Behavioral: Negative.  Negative for depression and memory loss. The patient is not nervous/anxious and does not have insomnia.   All other systems reviewed and are negative.   Performance status (ECOG): 0-1  Vitals:  Blood pressure 125/74, pulse (!) 107, temperature (!) 97.4 F (36.3 C), temperature source Tympanic, resp. rate 18, weight 122 lb 8 oz (55.6 kg), SpO2 96%, peak flow (!) 3 L/min. Physical Exam Constitutional:      General: She is not in acute distress.    Appearance: She is not diaphoretic.  HENT:     Head: Normocephalic and atraumatic.  Eyes:     General: No scleral icterus. Cardiovascular:     Rate and Rhythm: Normal rate.  Pulmonary:     Effort: Pulmonary effort is normal. No respiratory distress.     Comments: Decreased breath sound bilaterally. Breathing is comfortable on nasal cannula oxygen Abdominal:     General: There is no distension.  Musculoskeletal:  General: Normal range of motion.     Cervical back: Normal range of motion and neck supple.  Skin:    Findings: No erythema.  Neurological:     Mental  Status: She is alert and oriented to person, place, and time. Mental status is at baseline.     Cranial Nerves: No cranial nerve deficit.     Motor: No abnormal muscle tone.  Psychiatric:        Mood and Affect: Mood and affect normal.      RADIOGRAPHIC STUDIES: I have personally reviewed the radiological images as listed and agreed with the findings in the report. ECHOCARDIOGRAM COMPLETE  Result Date: 06/15/2023    ECHOCARDIOGRAM REPORT   Patient Name:   NORLENE HODGKIN Date of Exam: 06/15/2023 Medical Rec #:  [id number]    Height:       59.0 in Accession #:    [id number]   Weight:       129.0 lb Date of Birth:  08-09-1956    BSA:          1.531 m Patient Age:    34 years     BP:           137/85 mmHg Patient Gender: F            HR:           89 bpm. Exam Location:  [facility] Procedure: 2D Echo, Cardiac Doppler and Color Doppler Indications:     CHF  History:         Patient has prior history of Echocardiogram examinations, most                  recent 04/03/2021. CHF, CAD, PAD and COPD,                  Arrythmias:Bradycardia, Signs/Symptoms:Chest Pain and Shortness                  of Breath; Risk Factors:Hypertension, Diabetes, Dyslipidemia                  and Former Smoker. Lung CA.  Sonographer:     Mikki Harbor Referring Phys:  [id number] Andris Baumann Diagnosing Phys: Yvonne Kendall MD  Sonographer Comments: Technically challenging study due to limited acoustic windows and suboptimal apical window. IMPRESSIONS  1. Left ventricular ejection fraction, by estimation, is >55%. The left ventricle has normal function. Left ventricular endocardial border not optimally defined to evaluate regional wall motion. Left ventricular diastolic parameters are consistent with Grade I diastolic dysfunction (impaired relaxation).  2. Right ventricular systolic function is normal. The right ventricular size is normal. Tricuspid regurgitation signal is inadequate for assessing PA pressure.  3. The mitral valve was not  well visualized. Trivial mitral valve regurgitation.  4. Tricuspid valve regurgitation is mild to moderate.  5. The aortic valve was not well visualized. Aortic valve regurgitation is not visualized. No aortic stenosis is present.  6. The inferior vena cava is normal in size with greater than 50% respiratory variability, suggesting right atrial pressure of 3 mmHg. FINDINGS  Left Ventricle: Left ventricular ejection fraction, by estimation, is >55%. The left ventricle has normal function. Left ventricular endocardial border not optimally defined to evaluate regional wall motion. The left ventricular internal cavity size was  normal in size. There is no left ventricular hypertrophy. Left ventricular diastolic parameters are consistent with Grade I diastolic dysfunction (impaired relaxation). Right Ventricle: The right ventricular size is normal. No  increase in right ventricular wall thickness. Right ventricular systolic function is normal. Tricuspid regurgitation signal is inadequate for assessing PA pressure. Left Atrium: Left atrial size was normal in size. Right Atrium: Right atrial size was normal in size. Pericardium: There is no evidence of pericardial effusion. Mitral Valve: The mitral valve was not well visualized. There is mild thickening of the mitral valve leaflet(s). Trivial mitral valve regurgitation. MV peak gradient, 5.8 mmHg. The mean mitral valve gradient is 2.0 mmHg. Tricuspid Valve: The tricuspid valve is grossly normal. Tricuspid valve regurgitation is mild to moderate. Aortic Valve: The aortic valve was not well visualized. Aortic valve regurgitation is not visualized. No aortic stenosis is present. Aortic valve mean gradient measures 3.0 mmHg. Aortic valve peak gradient measures 5.5 mmHg. Aortic valve area, by VTI measures 1.64 cm. Pulmonic Valve: The pulmonic valve was not well visualized. Pulmonic valve regurgitation is not visualized. No evidence of pulmonic stenosis. Aorta: The aortic root  is normal in size and structure. Pulmonary Artery: The pulmonary artery is not well seen. Venous: The inferior vena cava is normal in size with greater than 50% respiratory variability, suggesting right atrial pressure of 3 mmHg. IAS/Shunts: The interatrial septum was not well visualized.  LEFT VENTRICLE PLAX 2D LVIDd:         4.00 cm   Diastology LVIDs:         2.80 cm   LV e' medial:    5.11 cm/s LV PW:         0.90 cm   LV E/e' medial:  10.2 LV IVS:        0.90 cm   LV e' lateral:   7.72 cm/s LVOT diam:     1.80 cm   LV E/e' lateral: 6.8 LV SV:         42 LV SV Index:   28 LVOT Area:     2.54 cm  RIGHT VENTRICLE RV Basal diam:  3.60 cm RV Mid diam:    2.90 cm RV S prime:     10.70 cm/s TAPSE (M-mode): 2.6 cm LEFT ATRIUM           Index        RIGHT ATRIUM          Index LA diam:      2.90 cm 1.89 cm/m   RA Area:     6.81 cm LA Vol (A4C): 36.3 ml 23.74 ml/m  RA Volume:   12.45 ml 8.13 ml/m  AORTIC VALVE                    PULMONIC VALVE AV Area (Vmax):    1.70 cm     PV Vmax:       1.05 m/s AV Area (Vmean):   1.61 cm     PV Peak grad:  4.4 mmHg AV Area (VTI):     1.64 cm AV Vmax:           116.90 cm/s AV Vmean:          82.550 cm/s AV VTI:            0.258 m AV Peak Grad:      5.5 mmHg AV Mean Grad:      3.0 mmHg LVOT Vmax:         78.20 cm/s LVOT Vmean:        52.200 cm/s LVOT VTI:          0.166 m LVOT/AV VTI ratio: 0.64  AORTA Ao Root diam: 2.90 cm MITRAL VALVE MV Area (PHT): 4.89 cm    SHUNTS MV Area VTI:   2.00 cm    Systemic VTI:  0.17 m MV Peak grad:  5.8 mmHg    Systemic Diam: 1.80 cm MV Mean grad:  2.0 mmHg MV Vmax:       1.20 m/s MV Vmean:      65.3 cm/s MV Decel Time: 155 msec MV E velocity: 52.20 cm/s MV A velocity: 88.20 cm/s MV E/A ratio:  0.59 Cristal Deer End MD Electronically signed by Yvonne Kendall MD Signature Date/Time: 06/15/2023/6:43:33 PM    Final    DG Chest Portable 1 View  Result Date: 06/15/2023 CLINICAL DATA:  COPD exacerbation EXAM: PORTABLE CHEST 1 VIEW COMPARISON:   06/08/2023, CT chest 04/16/2023, CT 02/20/2022, chest x-ray 11/18/2022 FINDINGS: Hyperinflation with emphysema. Similar bandlike densities and mild distortion in the right lower lung most likely due to post therapeutic changes. Possible small right-sided pleural effusion. Stable cardiomediastinal silhouette. Right apical scarring. IMPRESSION: Hyperinflation with emphysema. Similar bandlike densities and mild distortion in the right lower lung most likely due to post therapeutic changes. Possible small right-sided pleural effusion. Electronically Signed   By: Jasmine Pang M.D.   On: 06/15/2023 03:02   DG Chest 2 View  Result Date: 06/08/2023 CLINICAL DATA:  shortness of breath; history of pneumothorax. history of lung cancer and copd and chf Primary malignant neoplasm of right lung.  Dyspnea. EXAM: CHEST - 2 VIEW COMPARISON:  Chest radiograph 04/21/2023, CT 04/16/2023 FINDINGS: No pneumothorax. Bandlike density in the right mid lung and ill-defined opacity in the right lower lung zone, without significant interval change. No pulmonary edema or large pleural effusion. Stable heart size and mediastinal contours. Presumed lead less pacemaker. No acute osseous findings. IMPRESSION: 1. No pneumothorax. 2. Bandlike density in the right mid lung and ill-defined opacity in the right lower lung zone, without significant interval change. This may be post treatment related change. 3. No acute radiographic abnormalities. Electronically Signed   By: Narda Rutherford M.D.   On: 06/08/2023 17:53   VAS [name] ABI WITH/WO TBI  Result Date: 06/03/2023  LOWER EXTREMITY DOPPLER STUDY Patient Name:  GIRTRUDE COSTILOW  Date of Exam:   06/03/2023 Medical Rec #: [id number]     Accession #:    [id number] Date of Birth: 1955/09/20     Patient Gender: F Patient Age:   61 years Exam Location:  [facility] Procedure:      VAS [name] ABI WITH/WO TBI Referring Phys: Festus Barren  --------------------------------------------------------------------------------  Indications: Claudication, and peripheral artery disease. Other Factors: Left SFA stents are known to be occluded.                Patient complains of constant numbness in left foot.  Vascular Interventions: 06/01/16: Left distal SFA/popliteal arteries PTAs with                         SFA stent x2;                         06/08/16: Left SFA PTA;                         09/29/18: Left SFA stent x2;. Performing Technologist: Hardie Lora RVT  Examination Guidelines: A complete evaluation includes at minimum, Doppler waveform signals and systolic blood pressure reading at the level  of bilateral brachial, anterior tibial, and posterior tibial arteries, when vessel segments are accessible. Bilateral testing is considered an integral part of a complete examination. Photoelectric Plethysmograph (PPG) waveforms and toe systolic pressure readings are included as required and additional duplex testing as needed. Limited examinations for reoccurring indications may be performed as noted.  ABI Findings: +---------+------------------+-----+---------+--------+ Right    Rt Pressure (mmHg)IndexWaveform Comment  +---------+------------------+-----+---------+--------+ Brachial 123                                      +---------+------------------+-----+---------+--------+ PTA      101               0.82 biphasic          +---------+------------------+-----+---------+--------+ DP       110               0.89 triphasic         +---------+------------------+-----+---------+--------+ Great Toe97                0.79                   +---------+------------------+-----+---------+--------+ +---------+------------------+-----+----------+-------+ Left     Lt Pressure (mmHg)IndexWaveform  Comment +---------+------------------+-----+----------+-------+ Brachial 123                                       +---------+------------------+-----+----------+-------+ PTA      84                0.68 biphasic          +---------+------------------+-----+----------+-------+ DP       77                0.63 monophasic        +---------+------------------+-----+----------+-------+ Great Toe46                0.37                   +---------+------------------+-----+----------+-------+ +-------+-----------+-----------+------------+------------+ ABI/TBIToday's ABIToday's TBIPrevious ABIPrevious TBI +-------+-----------+-----------+------------+------------+ Right  0.89       0.79       0.95        0.74         +-------+-----------+-----------+------------+------------+ Left   0.68       0.37       0.74        0.76         +-------+-----------+-----------+------------+------------+ Bilateral ABIs appear essentially unchanged compared to prior study on 05/26/2022.  Summary: Right: Resting right ankle-brachial index indicates mild right lower extremity arterial disease. The right toe-brachial index is normal. Left: Resting left ankle-brachial index indicates moderate left lower extremity arterial disease. The left toe-brachial index is abnormal. *See table(s) above for measurements and observations.  Electronically signed by Levora Dredge MD on 06/03/2023 at 6:01:30 PM.    Final    DG Chest 2 View  Result Date: 04/21/2023 CLINICAL DATA:  Pneumothorax.  Post chest tube removal. EXAM: CHEST - 2 VIEW COMPARISON:  04/20/2023; 04/19/2023; 04/18/2023 FINDINGS: Grossly unchanged cardiac silhouette and mediastinal contours. Loop recorder device again overlies the midline of the chest. Interval removal of right-sided chest tube without definitive evidence of a residual or recurrent right-sided pneumothorax. Right mid and lower lung heterogeneous opacities are unchanged. Unchanged trace right-sided pleural effusion and right lateral chest wall subcutaneous  emphysema. The left lung remains well aerated.  No evidence of edema. No acute osseous abnormalities. IMPRESSION: 1. Interval removal of right-sided chest tube without definitive evidence of a residual or recurrent right-sided pneumothorax. 2. Unchanged right mid and lower lung heterogeneous opacities, atelectasis versus infiltrate. 3. Unchanged trace right-sided pleural effusion and right lateral chest wall subcutaneous emphysema. Electronically Signed   By: Simonne Come M.D.   On: 04/21/2023 08:42   DG Chest 1 View  Result Date: 04/20/2023 CLINICAL DATA:  Follow-up pneumothorax EXAM: CHEST  1 VIEW COMPARISON:  Prior chest x-ray 04/19/2023 FINDINGS: Persistent and unchanged small apical pneumothorax with chest tube in place. Spiculated pleuroparenchymal scarring again noted in the right middle and right lower lobe. Cardiac and mediastinal contours remain unchanged. No acute osseous abnormality. IMPRESSION: 1. Stable small apical pneumothorax with well-positioned chest tube in place. 2. Otherwise, unchanged appearance of the lungs. Electronically Signed   By: Malachy Moan M.D.   On: 04/20/2023 12:06   DG Chest Port 1 View  Result Date: 04/19/2023 CLINICAL DATA:  67 year old female with history of right-sided pneumothorax. EXAM: PORTABLE CHEST 1 VIEW COMPARISON:  Chest x-ray 04/18/2023. FINDINGS: Small bore right-sided chest tube with pigtail reformed in the lateral aspect of the upper right hemithorax. Trace residual right apical pneumothorax noted. Patchy multifocal irregular opacities in the right mid to lower lung, similar to prior studies, corresponding with areas of postradiation fibrosis from prior chest CT. Left lung is clear. No pleural effusions. No evidence of pulmonary edema. Heart size is normal. Upper mediastinal contours are within normal limits. IMPRESSION: 1. Stable position of right-sided chest tube with trace residual right apical pneumothorax. Radiographic appearance the chest is otherwise unchanged. Electronically Signed   By: Trudie Reed M.D.   On: 04/19/2023 08:52   DG Chest Port 1 View  Result Date: 04/18/2023 CLINICAL DATA:  Follow up pneumothorax. EXAM: PORTABLE CHEST 1 VIEW COMPARISON:  Radiographs 04/17/2023 and 04/16/2023.  CT 04/16/2023. FINDINGS: 0543 hours. Interval placement of a small caliber pigtail pleural catheter on the right, projecting over the right lateral lung apex. Minimal residual right apical pneumothorax. There is stable scarring at the right lung base. The lungs are otherwise clear. No pleural effusion. The heart size and mediastinal contours are stable with a leadless intracardiac pacemaker. Soft tissue emphysema noted about the chest tube insertion in the right lateral chest wall. No acute osseous findings. IMPRESSION: 1. Minimal residual right apical pneumothorax following chest tube placement. No other significant changes. 2. Stable right basilar scarring. Electronically Signed   By: Carey Bullocks M.D.   On: 04/18/2023 09:03   CT [facility] PLEURAL DRAIN W/INDWELL CATH W/IMG GUIDE  Result Date: 04/17/2023 INDICATION: Symptomatic right pneumothorax EXAM: CT-GUIDED CHEST TUBE PLACEMENT MEDICATIONS: No periprocedural antibiotics were indicated ANESTHESIA/SEDATION: Lidocaine 1% subcutaneous COMPLICATIONS: None immediate. PROCEDURE: Informed written consent was obtained from the patient after a thorough discussion of the procedural risks, benefits and alternatives. All questions were addressed. Maximal Sterile Barrier Technique was utilized including caps, mask, sterile gowns, sterile gloves, sterile drape, hand hygiene and skin antiseptic. A timeout was performed prior to the initiation of the procedure. Select scans through the thorax obtained. Appropriate skin entry site was determined and marked. Region was prepped with chlorhexidine, draped in usual sterile fashion, infiltrated locally with 1% lidocaine. 7 cm Yueh sheath needle was advanced into the pleural space. Gas could be aspirated. Amplatz guidewire  advanced easily. Tract dilated to facilitate placement of a 14 French pigtail drain catheter. Catheter  was placed to Pleur-Evac suction device. Confirmatory CT demonstrated good catheter position directed towards the apex, with virtually complete evacuation of the pneumothorax. Small volume subcutaneous emphysema. The catheter was secured externally with 0 Prolene suture and StatLock and a sterile dressing applied. The patient tolerated the procedure well. RADIATION DOSE REDUCTION: This exam was performed according to the departmental dose-optimization program which includes automated exposure control, adjustment of the mA and/or kV according to patient size and/or use of iterative reconstruction technique. IMPRESSION: 1. Technically successful CT-guided right chest tube placement. Electronically Signed   By: Corlis Leak M.D.   On: 04/17/2023 14:29   DG Chest 2 View  Result Date: 04/17/2023 CLINICAL DATA:  67 year old female with history of lung cancer. Right hydropneumothorax, abnormal right middle and lower lobe on recent CT. EXAM: CHEST - 2 VIEW COMPARISON:  Chest CT 04/16/2023 and earlier. FINDINGS: Seated AP and lateral views of the chest at 0559 hours. Persistent right pneumothorax, not significantly changed from the CT and radiographs on 04/16/2023. Pleural fluid component better demonstrated by CT. Mildly improved right lung base ventilation compared to the most recent radiographs with residual streaky and patchy opacity better characterized by CT. Stable cardiac size and mediastinal contours. Visualized tracheal air column is within normal limits. Left lung appears stable, negative. No acute osseous abnormality identified. Negative visible bowel gas. IMPRESSION: 1. Persistent right hydropneumothorax, no significant change from yesterday. 2. No new cardiopulmonary abnormality. Electronically Signed   By: Odessa Fleming M.D.   On: 04/17/2023 06:21   CT CHEST WO CONTRAST  Result Date: 04/16/2023 CLINICAL DATA:   Pneumothorax. Dyspnea on exertion that started yesterday. Dry cough. EXAM: CT CHEST WITHOUT CONTRAST TECHNIQUE: Multidetector CT imaging of the chest was performed following the standard protocol without IV contrast. RADIATION DOSE REDUCTION: This exam was performed according to the departmental dose-optimization program which includes automated exposure control, adjustment of the mA and/or kV according to patient size and/or use of iterative reconstruction technique. COMPARISON:  Radiograph 04/16/2023 at 3:35 p.m. and CT chest 03/24/2023 FINDINGS: Cardiovascular: Normal heart size. No pericardial effusion. Leadless pacer. Coronary artery and aortic atherosclerotic calcification. Mediastinum/Nodes: Trachea and esophagus are unremarkable. No thoracic adenopathy. No mediastinal shift Lungs/Pleura: Moderate right apical, anterior, and basilar pneumothorax as seen on same day chest radiograph. Small right pleural effusion. Slightly increased consolidation about the postradiation fibrosis and architectural distortion in the right middle lobe and right lower lobe favored due to volume loss from the pneumothorax. Advanced centrilobular emphysema greatest in the upper lungs. Right apical scarring. Unchanged 4 mm right upper lobe nodule on series 2/image 33. Upper Abdomen: No acute abnormality. Musculoskeletal: Unchanged right rib fractures. IMPRESSION: 1. Moderate right hydropneumothorax.  No evidence of tension. 2. Slightly increased consolidation about the postradiation fibrosis and architectural distortion in the right middle lobe and right lower lobe favored due to volume loss from the pneumothorax. 3. Unchanged 4 mm right upper lobe nodule. Continued attention on follow-up. Aortic Atherosclerosis (ICD10-I70.0) and Emphysema (ICD10-J43.9). Electronically Signed   By: Minerva Fester M.D.   On: 04/16/2023 18:57   DG Chest 2 View  Result Date: 04/16/2023 CLINICAL DATA:  Dyspnea on exertion since yesterday. EXAM:  CHEST - 2 VIEW COMPARISON:  November 18, 2022.  March 24, 2023. FINDINGS: Stable cardiomediastinal silhouette. Stable post treatment change seen in right lung base. Left lung is clear. Small right apical and basilar pneumothorax is noted. Bony thorax is unremarkable. IMPRESSION: Small right apical and basilar pneumothorax is noted. Critical Value/emergent results were  called by telephone at the time of interpretation on 04/16/2023 at 4:07 pm to provider Dr. Lenard Lance, who verbally acknowledged these results. Electronically Signed   By: Lupita Raider M.D.   On: 04/16/2023 16:09

## 2023-07-08 NOTE — Assessment & Plan Note (Addendum)
Lab Results  Component Value Date   HGB 9.4 (L) 07/06/2023   TIBC 368 06/15/2023   IRONPCTSAT 7 (L) 06/15/2023   FERRITIN 22 06/15/2023    She tolerated IV Venofer.  Proceed with Venofer 200mg  x 1 today.  After that continue oral ferrous sulfate 325mg  BID

## 2023-07-08 NOTE — Assessment & Plan Note (Signed)
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  I discussed with patient about the diagnosis of IgG MGUS which is an asymptomatic condition which has a small risk of progression to smoldering multiple myeloma and to symptomatic multiple myeloma. Less frequently, these patients progress to AL amyloidosis, light chain deposition disease, or another lymphoproliferative disorder. For now I recommend observation. Check SPEP and light chain ratio every 6 months.

## 2023-07-08 NOTE — Progress Notes (Signed)
Patient declined to wait the 30 minutes for post iron infusion observation today. Tolerated infusion well. VSS. 

## 2023-07-08 NOTE — Assessment & Plan Note (Signed)
Stable.  Continue oxygen supplementation.

## 2023-07-09 ENCOUNTER — Inpatient Hospital Stay: Payer: 59

## 2023-07-10 ENCOUNTER — Other Ambulatory Visit: Payer: Self-pay | Admitting: Nurse Practitioner

## 2023-07-10 DIAGNOSIS — F419 Anxiety disorder, unspecified: Secondary | ICD-10-CM

## 2023-07-17 ENCOUNTER — Other Ambulatory Visit: Payer: Self-pay | Admitting: Nurse Practitioner

## 2023-07-17 DIAGNOSIS — J449 Chronic obstructive pulmonary disease, unspecified: Secondary | ICD-10-CM

## 2023-07-20 ENCOUNTER — Ambulatory Visit (INDEPENDENT_AMBULATORY_CARE_PROVIDER_SITE_OTHER): Payer: 59 | Admitting: Nurse Practitioner

## 2023-07-20 ENCOUNTER — Encounter (INDEPENDENT_AMBULATORY_CARE_PROVIDER_SITE_OTHER): Payer: Self-pay | Admitting: Nurse Practitioner

## 2023-07-20 VITALS — BP 132/81 | HR 99 | Resp 16 | Ht 59.0 in | Wt 127.6 lb

## 2023-07-20 DIAGNOSIS — E0800 Diabetes mellitus due to underlying condition with hyperosmolarity without nonketotic hyperglycemic-hyperosmolar coma (NKHHC): Secondary | ICD-10-CM | POA: Diagnosis not present

## 2023-07-20 DIAGNOSIS — Z794 Long term (current) use of insulin: Secondary | ICD-10-CM

## 2023-07-20 DIAGNOSIS — I70222 Atherosclerosis of native arteries of extremities with rest pain, left leg: Secondary | ICD-10-CM | POA: Diagnosis not present

## 2023-07-20 DIAGNOSIS — I10 Essential (primary) hypertension: Secondary | ICD-10-CM

## 2023-07-20 DIAGNOSIS — E785 Hyperlipidemia, unspecified: Secondary | ICD-10-CM | POA: Diagnosis not present

## 2023-07-22 ENCOUNTER — Telehealth (INDEPENDENT_AMBULATORY_CARE_PROVIDER_SITE_OTHER): Payer: Self-pay

## 2023-07-22 NOTE — Telephone Encounter (Signed)
I attempted to contact the patient to schedule her for a LLE angio with Dr. Wyn Quaker. I was unable to leave a message as her voicemail box is not set up. I then called her daughter Camelia Eng and asked if she could have the patient contact me. Patient's daughter stated she would have the patient to contact me.

## 2023-07-22 NOTE — Telephone Encounter (Signed)
Spoke with the patient and she is scheduled with Dr. Wyn Quaker on 07/29/23 with a 11:00 am arrival time to the [facility] [place] for a LLE angio. Pre-procedure instructions were discussed and will be mailed.Angela Jensen

## 2023-07-24 ENCOUNTER — Other Ambulatory Visit: Payer: Self-pay | Admitting: Nurse Practitioner

## 2023-07-24 DIAGNOSIS — E1165 Type 2 diabetes mellitus with hyperglycemia: Secondary | ICD-10-CM

## 2023-07-24 NOTE — Progress Notes (Signed)
Subjective:    [name], female    DOB: 30-Mar-1956, 67 y.o.   MRN: [id number] Chief Complaint  Patient presents with   Follow-up    f/u in 1 Year with abi    The patient returns to the office for followup of atherosclerotic changes of the lower extremities and review of the noninvasive studies.   The patient notes that there has been a significant deterioration in the lower extremity symptoms.  The patient notes interval shortening of their claudication distance and development of mild rest pain symptoms.  Her largest complaint and issues of numbness which has been increasing they worsening especially in the left lower extremity no new ulcers or wounds have occurred since the last visit.  There have been no significant changes to the patient's overall health care.  The patient denies amaurosis fugax or recent TIA symptoms. There are no recent neurological changes noted. There is no history of DVT, PE or superficial thrombophlebitis. The patient denies recent episodes of angina or shortness of breath.   ABI's Rt=0.89 and Lt=0.68 (previous ABI's Rt=0.95 and Lt=0.74) Duplex US of the lower extremity arterial system shows biphasic/triphasic waveforms in the right lower extremity with monophasic/biphasic in the left.  There is also a notable decrease in the TBI of the left lower extremity.    Review of Systems  Neurological:  Positive for numbness.  All other systems reviewed and are negative.      Objective:   Physical Exam Vitals reviewed.  HENT:     Head: Normocephalic.  Cardiovascular:     Rate and Rhythm: Normal rate.     Pulses:          Dorsalis pedis pulses are detected w/ Doppler on the right side and detected w/ Doppler on the left side.       Posterior tibial pulses are detected w/ Doppler on the right side and detected w/ Doppler on the left side.  Pulmonary:     Effort: Pulmonary effort is normal.  Skin:    General: Skin is warm and dry.   Neurological:     Mental Status: She is alert and oriented to person, place, and time.  Psychiatric:        Mood and Affect: Mood normal.        Behavior: Behavior normal.        Thought Content: Thought content normal.        Judgment: Judgment normal.     BP 132/81 (BP Location: Left Arm)   Pulse 99   Resp 16   Ht 4\' 11"  (1.499 m)   Wt 127 lb 9.6 oz (57.9 kg)   BMI 25.77 kg/m   Past Medical History:  Diagnosis Date   Anemia    Arthritis    Asthma    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/2016   Cancer (HCC) 2012   Right Lung CA   COPD (chronic obstructive pulmonary disease) (HCC)    Depression    Diabetes mellitus without complication (HCC)    Patient takes Janumet   Essential hypertension 05/26/2016   Heart failure (HCC) 2022   Hydropneumothorax 05/03/2020   Hypercholesteremia    Oxygen dependent    2L at nite    PAD (peripheral artery disease) (HCC) 06/22/2016   Peripheral vascular disease (HCC)    Personal history of radiation therapy    Shortness of breath dyspnea    with exertion    Sialolithiasis  Sleep apnea    Wears dentures    full upper and lower    Social History   Socioeconomic History   Marital status: Widowed    Spouse name: Not on file   Number of children: Not on file   Years of education: Not on file   Highest education level: Not on file  Occupational History   Not on file  Tobacco Use   Smoking status: Former    Current packs/day: 0.00    Average packs/day: 1 pack/day for 37.0 years (37.0 ttl pk-yrs)    Types: Cigarettes    Start date: 02/06/1973    Quit date: 02/06/2010    Years since quitting: 13.4   Smokeless tobacco: Former    Types: Snuff  Vaping Use   Vaping status: Never Used  Substance and Sexual Activity   Alcohol use: Not Currently    Alcohol/week: 5.0 standard drinks of alcohol    Types: 5 Cans of beer per week    Comment: /h x of alcohol abuse -stopped 2012-  now drinks 5 beer per week   Drug use: Not Currently    Types: Marijuana, "Crack" cocaine, Cocaine    Comment: hx of cocaine use- last use 2015; last use marijuana6/22/19,   Sexual activity: Yes  Other Topics Concern   Not on file  Social History Narrative   Lives with Significant Other x 43 years   Social Determinants of Health   Financial Resource Strain: Not on file  Food Insecurity: No Food Insecurity (06/15/2023)   Hunger Vital Sign    Worried About Running Out of Food in the Last Year: Never true    Ran Out of Food in the Last Year: Never true  Transportation Needs: No Transportation Needs (06/15/2023)   PRAPARE - Administrator, Civil Service (Medical): No    Lack of Transportation (Non-Medical): No  Physical Activity: Not on file  Stress: Not on file  Social Connections: Not on file  Intimate Partner Violence: Not At Risk (06/15/2023)   Humiliation, Afraid, Rape, and Kick questionnaire    Fear of Current or Ex-Partner: No    Emotionally Abused: No    Physically Abused: No    Sexually Abused: No    Past Surgical History:  Procedure Laterality Date   CESAREAN SECTION     x3   COLONOSCOPY WITH PROPOFOL N/A 06/25/2015   Procedure: COLONOSCOPY WITH PROPOFOL;  Surgeon: Midge Minium, MD;  Location: [facility];  Service: Endoscopy;  Laterality: N/A;   COLONOSCOPY WITH PROPOFOL N/A 07/26/2020   Procedure: COLONOSCOPY WITH PROPOFOL;  Surgeon: Midge Minium, MD;  Location: [facility];  Service: Endoscopy;  Laterality: N/A;   CYST REMOVAL LEG     and on shoulder    ESOPHAGOGASTRODUODENOSCOPY (EGD) WITH PROPOFOL N/A 07/26/2020   Procedure: ESOPHAGOGASTRODUODENOSCOPY (EGD) WITH PROPOFOL;  Surgeon: Midge Minium, MD;  Location: [facility];  Service: Endoscopy;  Laterality: N/A;  Diabetic - oral meds   LOWER EXTREMITY ANGIOGRAPHY Left 09/29/2018   Procedure: LOWER EXTREMITY ANGIOGRAPHY;  Surgeon: Annice Needy, MD;  Location: [facility];   Service: Cardiovascular;  Laterality: Left;   LUNG BIOPSY  12/30/2011   has lung "spots"   PACEMAKER IMPLANT  07/14/2021   PACEMAKER LEADLESS INSERTION N/A 07/14/2021   Procedure: PACEMAKER LEADLESS INSERTION;  Surgeon: Marcina Millard, MD;  Location: [facility];  Service: Cardiovascular;  Laterality: N/A;   PERIPHERAL VASCULAR CATHETERIZATION Left 06/01/2016   Procedure: Lower Extremity Angiography;  Surgeon: Annice Needy, MD;  Location: [facility];  Service: Cardiovascular;  Laterality: Left;   PERIPHERAL VASCULAR CATHETERIZATION N/A 06/01/2016   Procedure: Abdominal Aortogram w/Lower Extremity;  Surgeon: Annice Needy, MD;  Location: [facility];  Service: Cardiovascular;  Laterality: N/A;   PERIPHERAL VASCULAR CATHETERIZATION  06/01/2016   Procedure: Lower Extremity Intervention;  Surgeon: Annice Needy, MD;  Location: [facility];  Service: Cardiovascular;;   PERIPHERAL VASCULAR CATHETERIZATION Right 06/08/2016   Procedure: Lower Extremity Angiography;  Surgeon: Annice Needy, MD;  Location: [facility];  Service: Cardiovascular;  Laterality: Right;   PERIPHERAL VASCULAR CATHETERIZATION  06/08/2016   Procedure: Lower Extremity Intervention;  Surgeon: Annice Needy, MD;  Location: [facility];  Service: Cardiovascular;;   SUBMANDIBULAR GLAND EXCISION Left 12/06/2020   Procedure: EXCISION SUBMANDIBULAR GLAND;  Surgeon: Linus Salmons, MD;  Location: [facility];  Service: ENT;  Laterality: Left;  needs to be first case Diabetic - diet controlled   TEMPORARY PACEMAKER N/A 07/11/2021   Procedure: TEMPORARY PACEMAKER;  Surgeon: Marcina Millard, MD;  Location: [facility];  Service: Cardiovascular;  Laterality: N/A;    Family History  Problem Relation Age of Onset   Diabetes Mother    Hypercholesterolemia Mother    Lung cancer Father    Diabetes Sister    Diabetes Sister    Hypertension Sister    Diabetes  Maternal Grandmother    Diabetes Paternal Grandmother    Heart attack Brother    Coronary artery disease Brother    Vascular Disease Brother    Heart attack Brother    Breast cancer Neg Hx     Allergies  Allergen Reactions   Trelegy Ellipta [Fluticasone-Umeclidin-Vilant]     Had breathing issues       Latest Ref Rng & Units 07/06/2023   10:17 AM 06/18/2023    4:11 AM 06/17/2023    5:49 AM  CBC  WBC 4.0 - 10.5 K/uL 4.8  6.6  6.2   Hemoglobin 12.0 - 15.0 g/dL 9.4  7.9  7.8   Hematocrit 36.0 - 46.0 % 31.9  25.7  25.5   Platelets 150 - 400 K/uL 282  296  320       CMP     Component Value Date/Time   NA 141 07/06/2023 1016   NA 137 03/28/2020 1037   NA 141 04/17/2014 1055   K 3.5 07/06/2023 1016   K 3.9 04/17/2014 1055   CL 99 07/06/2023 1016   CL 102 04/17/2014 1055   CO2 32 07/06/2023 1016   CO2 30 04/17/2014 1055   GLUCOSE 215 (H) 07/06/2023 1016   GLUCOSE 201 (H) 04/17/2014 1055   BUN 22 07/06/2023 1016   BUN 10 03/28/2020 1037   BUN 10 04/17/2014 1055   CREATININE 0.84 07/06/2023 1016   CREATININE 0.90 04/17/2014 1055   CALCIUM 9.0 07/06/2023 1016   CALCIUM 9.1 04/17/2014 1055   PROT 6.9 07/06/2023 1016   PROT 7.6 03/28/2020 1037   PROT 8.1 04/17/2014 1055   ALBUMIN 3.9 07/06/2023 1016   ALBUMIN 4.7 03/28/2020 1037   ALBUMIN 4.1 04/17/2014 1055   AST 24 07/06/2023 1016   ALT 27 07/06/2023 1016   ALT 29 04/17/2014 1055   ALKPHOS 78 07/06/2023 1016   ALKPHOS 73 04/17/2014 1055   BILITOT 0.4 07/06/2023 1016   GFRNONAA >60 07/06/2023 1016   GFRNONAA >60 04/17/2014 1055     VAS [name]  ABI WITH/WO TBI  Result Date: 06/03/2023  LOWER EXTREMITY DOPPLER STUDY Patient Name:  YVONNE LEA  Date of Exam:   06/03/2023 Medical Rec #: [id number]     Accession #:    [id number] Date of Birth: 04/16/1956     Patient Gender: F Patient Age:   86 years Exam Location:  [facility] Procedure:      VAS [name] ABI WITH/WO TBI Referring Phys: Barbara Cower DEW  --------------------------------------------------------------------------------  Indications: Claudication, and peripheral artery disease. Other Factors: Left SFA stents are known to be occluded.                Patient complains of constant numbness in left foot.  Vascular Interventions: 06/01/16: Left distal SFA/popliteal arteries PTAs with                         SFA stent x2;                         06/08/16: Left SFA PTA;                         09/29/18: Left SFA stent x2;. Performing Technologist: Hardie Lora RVT  Examination Guidelines: A complete evaluation includes at minimum, Doppler waveform signals and systolic blood pressure reading at the level of bilateral brachial, anterior tibial, and posterior tibial arteries, when vessel segments are accessible. Bilateral testing is considered an integral part of a complete examination. Photoelectric Plethysmograph (PPG) waveforms and toe systolic pressure readings are included as required and additional duplex testing as needed. Limited examinations for reoccurring indications may be performed as noted.  ABI Findings: +---------+------------------+-----+---------+--------+ Right    Rt Pressure (mmHg)IndexWaveform Comment  +---------+------------------+-----+---------+--------+ Brachial 123                                      +---------+------------------+-----+---------+--------+ PTA      101               0.82 biphasic          +---------+------------------+-----+---------+--------+ DP       110               0.89 triphasic         +---------+------------------+-----+---------+--------+ Great Toe97                0.79                   +---------+------------------+-----+---------+--------+ +---------+------------------+-----+----------+-------+ Left     Lt Pressure (mmHg)IndexWaveform  Comment +---------+------------------+-----+----------+-------+ Brachial 123                                       +---------+------------------+-----+----------+-------+ PTA      84                0.68 biphasic          +---------+------------------+-----+----------+-------+ DP       77                0.63 monophasic        +---------+------------------+-----+----------+-------+ Great Toe46                0.37                   +---------+------------------+-----+----------+-------+ +-------+-----------+-----------+------------+------------+  ABI/TBIToday's ABIToday's TBIPrevious ABIPrevious TBI +-------+-----------+-----------+------------+------------+ Right  0.89       0.79       0.95        0.74         +-------+-----------+-----------+------------+------------+ Left   0.68       0.37       0.74        0.76         +-------+-----------+-----------+------------+------------+ Bilateral ABIs appear essentially unchanged compared to prior study on 05/26/2022.  Summary: Right: Resting right ankle-brachial index indicates mild right lower extremity arterial disease. The right toe-brachial index is normal. Left: Resting left ankle-brachial index indicates moderate left lower extremity arterial disease. The left toe-brachial index is abnormal. *See table(s) above for measurements and observations.  Electronically signed by Levora Dredge MD on 06/03/2023 at 6:01:30 PM.    Final        Assessment & Plan:   1. Atherosclerosis of native artery of left lower extremity with rest pain (HCC) Recommend:  The patient has evidence of severe atherosclerotic changes of both lower extremities with rest pain that is associated with preulcerative changes and impending tissue loss of the left foot.  This represents a limb threatening ischemia and places the patient at the risk for left limb loss.  Patient should undergo angiography of the left lower extremity with the hope for intervention for limb salvage.  The risks and benefits as well as the alternative therapies was discussed in detail with the  patient.  All questions were answered.  Patient agrees to proceed with left lower extremity angiography.  The patient will follow up with me in the office after the procedure.      2. Essential hypertension Continue antihypertensive medications as already ordered, these medications have been reviewed and there are no changes at this time.  3. Diabetes mellitus due to underlying condition with hyperosmolarity without coma, with long-term current use of insulin (HCC) Continue hypoglycemic medications as already ordered, these medications have been reviewed and there are no changes at this time.  Hgb A1C to be monitored as already arranged by primary service  4. Hyperlipidemia, unspecified hyperlipidemia type Continue statin as ordered and reviewed, no changes at this time   Current Outpatient Medications on File Prior to Visit  Medication Sig Dispense Refill   Accu-Chek Softclix Lancets lancets Use 1 lancet 3 times daily to check glucose for diabetes 300 each 1   albuterol (VENTOLIN HFA) 108 (90 Base) MCG/ACT inhaler INHALE 2 PUFFS BY MOUTH EVERY 6 HOURS AS NEEDED FOR WHEEZING OR SHORTNESS OF BREATH 9 g 1   ALPRAZolam (XANAX) 0.25 MG tablet TAKE 1 TABLET BY MOUTH THREE TIMES DAILY AS NEEDED FOR ANXIETY 60 tablet 0   Blood Glucose Monitoring Suppl (ACCU-CHEK GUIDE) w/Device KIT Use as directed Dx e11.65 1 kit 0   calcium-vitamin D (OSCAL WITH D) 500-5 MG-MCG tablet Take 1 tablet by mouth 2 (two) times daily. 100 tablet 0   clopidogrel (PLAVIX) 75 MG tablet Take 1 tablet (75 mg total) by mouth daily. 90 tablet 3   Continuous Blood Gluc Receiver (DEXCOM G7 RECEIVER) DEVI Use one as directed for uncontrolled dm 1 each 0   Continuous Blood Gluc Sensor (DEXCOM G7 SENSOR) MISC Apply 1 sensor to skin on designated areas every 10 days to monitor glucose levels with sensor or smart phone app 3 each 11   cyclobenzaprine (FLEXERIL) 10 MG tablet Take 1 tablet (10 mg total) by mouth at bedtime. Take one  tab po qhs for back spasm prn only 30 tablet 3   Ensifentrine (OHTUVAYRE) 3 MG/2.5ML SUSP Inhale 3 mg into the lungs in the morning and at bedtime. 150 mL 11   escitalopram (LEXAPRO) 10 MG tablet Take 1 tablet by mouth once daily 90 tablet 1   ferrous sulfate 325 (65 FE) MG tablet Take 325 mg by mouth 2 (two) times daily with a meal.     furosemide (LASIX) 20 MG tablet Take 1 tablet (20 mg total) by mouth daily as needed. 30 tablet 5   gabapentin (NEURONTIN) 300 MG capsule Take 1 capsule (300 mg total) by mouth 3 (three) times daily. 90 capsule 3   glucose blood (ACCU-CHEK GUIDE) test strip USE TO TEST BLOOD SUGARS 5 TIMES DAILY 450 each 0   HYDROcodone-acetaminophen (NORCO/VICODIN) 5-325 MG tablet Take 1 tablet by mouth every 4 (four) hours as needed for moderate pain. 30 tablet 0   insulin detemir (LEVEMIR FLEXTOUCH) 100 UNIT/ML FlexPen Inject 10 Units into the skin daily.     Insulin Pen Needle (RELION PEN NEEDLES) 31G X 6 MM MISC USE 1 PEN NEEDLE WITH INSULIN PEN ONCE DAILY FOR DIABETES 50 each 3   ipratropium-albuterol (DUONEB) 0.5-2.5 (3) MG/3ML SOLN Take 3 mLs by nebulization every 4 (four) hours as needed. 360 mL 3   lisinopril-hydrochlorothiazide (ZESTORETIC) 20-25 MG tablet Take 1 tablet by mouth daily.     metFORMIN (GLUCOPHAGE) 500 MG tablet TAKE 2 TABLETS BY MOUTH ONCE DAILY WITH BREAKFAST 180 tablet 1   metoprolol succinate (TOPROL-XL) 25 MG 24 hr tablet Take 1 tablet (25 mg total) by mouth daily. 30 tablet 3   mometasone-formoterol (DULERA) 200-5 MCG/ACT AERO Inhale 2 puffs by mouth twice daily 13 g 12   Omega-3 Fatty Acids (FISH OIL) 1000 MG CAPS Take 1 capsule by mouth daily.     OXYGEN Inhale 4 L into the lungs. PT USES [facility] FOR OXYGEN     potassium chloride (KLOR-CON) 10 MEQ tablet Take 1 tablet (10 mEq total) by mouth every other day. 45 tablet 2   SPIRIVA HANDIHALER 18 MCG inhalation capsule INHALE THE CONTENTS OF 1 CAPSULES BY MOUTH ONCE DAILY - DO NOT SWALLOW CAPSULES  90 capsule 3   VITAMIN D, CHOLECALCIFEROL, PO Take 1 tablet by mouth daily with supper.     famotidine (PEPCID) 20 MG tablet Take 1 tablet (20 mg total) by mouth daily for 14 days. (Patient taking differently: Take 20 mg by mouth daily as needed for heartburn.) 14 tablet 0   No current facility-administered medications on file prior to visit.    There are no Patient Instructions on file for this visit. No follow-ups on file.   Georgiana Spinner, NP

## 2023-07-24 NOTE — H&P (View-Only) (Signed)
Subjective:    [name], female    DOB: 30-Mar-1956, 67 y.o.   MRN: [id number] Chief Complaint  Patient presents with   Follow-up    f/u in 1 Year with abi    The patient returns to the office for followup of atherosclerotic changes of the lower extremities and review of the noninvasive studies.   The patient notes that there has been a significant deterioration in the lower extremity symptoms.  The patient notes interval shortening of their claudication distance and development of mild rest pain symptoms.  Her largest complaint and issues of numbness which has been increasing they worsening especially in the left lower extremity no new ulcers or wounds have occurred since the last visit.  There have been no significant changes to the patient's overall health care.  The patient denies amaurosis fugax or recent TIA symptoms. There are no recent neurological changes noted. There is no history of DVT, PE or superficial thrombophlebitis. The patient denies recent episodes of angina or shortness of breath.   ABI's Rt=0.89 and Lt=0.68 (previous ABI's Rt=0.95 and Lt=0.74) Duplex US of the lower extremity arterial system shows biphasic/triphasic waveforms in the right lower extremity with monophasic/biphasic in the left.  There is also a notable decrease in the TBI of the left lower extremity.    Review of Systems  Neurological:  Positive for numbness.  All other systems reviewed and are negative.      Objective:   Physical Exam Vitals reviewed.  HENT:     Head: Normocephalic.  Cardiovascular:     Rate and Rhythm: Normal rate.     Pulses:          Dorsalis pedis pulses are detected w/ Doppler on the right side and detected w/ Doppler on the left side.       Posterior tibial pulses are detected w/ Doppler on the right side and detected w/ Doppler on the left side.  Pulmonary:     Effort: Pulmonary effort is normal.  Skin:    General: Skin is warm and dry.   Neurological:     Mental Status: She is alert and oriented to person, place, and time.  Psychiatric:        Mood and Affect: Mood normal.        Behavior: Behavior normal.        Thought Content: Thought content normal.        Judgment: Judgment normal.     BP 132/81 (BP Location: Left Arm)   Pulse 99   Resp 16   Ht 4\' 11"  (1.499 m)   Wt 127 lb 9.6 oz (57.9 kg)   BMI 25.77 kg/m   Past Medical History:  Diagnosis Date   Anemia    Arthritis    Asthma    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/2016   Cancer (HCC) 2012   Right Lung CA   COPD (chronic obstructive pulmonary disease) (HCC)    Depression    Diabetes mellitus without complication (HCC)    Patient takes Janumet   Essential hypertension 05/26/2016   Heart failure (HCC) 2022   Hydropneumothorax 05/03/2020   Hypercholesteremia    Oxygen dependent    2L at nite    PAD (peripheral artery disease) (HCC) 06/22/2016   Peripheral vascular disease (HCC)    Personal history of radiation therapy    Shortness of breath dyspnea    with exertion    Sialolithiasis  Sleep apnea    Wears dentures    full upper and lower    Social History   Socioeconomic History   Marital status: Widowed    Spouse name: Not on file   Number of children: Not on file   Years of education: Not on file   Highest education level: Not on file  Occupational History   Not on file  Tobacco Use   Smoking status: Former    Current packs/day: 0.00    Average packs/day: 1 pack/day for 37.0 years (37.0 ttl pk-yrs)    Types: Cigarettes    Start date: 02/06/1973    Quit date: 02/06/2010    Years since quitting: 13.4   Smokeless tobacco: Former    Types: Snuff  Vaping Use   Vaping status: Never Used  Substance and Sexual Activity   Alcohol use: Not Currently    Alcohol/week: 5.0 standard drinks of alcohol    Types: 5 Cans of beer per week    Comment: /h x of alcohol abuse -stopped 2012-  now drinks 5 beer per week   Drug use: Not Currently    Types: Marijuana, "Crack" cocaine, Cocaine    Comment: hx of cocaine use- last use 2015; last use marijuana6/22/19,   Sexual activity: Yes  Other Topics Concern   Not on file  Social History Narrative   Lives with Significant Other x 43 years   Social Determinants of Health   Financial Resource Strain: Not on file  Food Insecurity: No Food Insecurity (06/15/2023)   Hunger Vital Sign    Worried About Running Out of Food in the Last Year: Never true    Ran Out of Food in the Last Year: Never true  Transportation Needs: No Transportation Needs (06/15/2023)   PRAPARE - Administrator, Civil Service (Medical): No    Lack of Transportation (Non-Medical): No  Physical Activity: Not on file  Stress: Not on file  Social Connections: Not on file  Intimate Partner Violence: Not At Risk (06/15/2023)   Humiliation, Afraid, Rape, and Kick questionnaire    Fear of Current or Ex-Partner: No    Emotionally Abused: No    Physically Abused: No    Sexually Abused: No    Past Surgical History:  Procedure Laterality Date   CESAREAN SECTION     x3   COLONOSCOPY WITH PROPOFOL N/A 06/25/2015   Procedure: COLONOSCOPY WITH PROPOFOL;  Surgeon: Midge Minium, MD;  Location: [facility];  Service: Endoscopy;  Laterality: N/A;   COLONOSCOPY WITH PROPOFOL N/A 07/26/2020   Procedure: COLONOSCOPY WITH PROPOFOL;  Surgeon: Midge Minium, MD;  Location: [facility];  Service: Endoscopy;  Laterality: N/A;   CYST REMOVAL LEG     and on shoulder    ESOPHAGOGASTRODUODENOSCOPY (EGD) WITH PROPOFOL N/A 07/26/2020   Procedure: ESOPHAGOGASTRODUODENOSCOPY (EGD) WITH PROPOFOL;  Surgeon: Midge Minium, MD;  Location: [facility];  Service: Endoscopy;  Laterality: N/A;  Diabetic - oral meds   LOWER EXTREMITY ANGIOGRAPHY Left 09/29/2018   Procedure: LOWER EXTREMITY ANGIOGRAPHY;  Surgeon: Annice Needy, MD;  Location: [facility];   Service: Cardiovascular;  Laterality: Left;   LUNG BIOPSY  12/30/2011   has lung "spots"   PACEMAKER IMPLANT  07/14/2021   PACEMAKER LEADLESS INSERTION N/A 07/14/2021   Procedure: PACEMAKER LEADLESS INSERTION;  Surgeon: Marcina Millard, MD;  Location: [facility];  Service: Cardiovascular;  Laterality: N/A;   PERIPHERAL VASCULAR CATHETERIZATION Left 06/01/2016   Procedure: Lower Extremity Angiography;  Surgeon: Annice Needy, MD;  Location: [facility];  Service: Cardiovascular;  Laterality: Left;   PERIPHERAL VASCULAR CATHETERIZATION N/A 06/01/2016   Procedure: Abdominal Aortogram w/Lower Extremity;  Surgeon: Annice Needy, MD;  Location: [facility];  Service: Cardiovascular;  Laterality: N/A;   PERIPHERAL VASCULAR CATHETERIZATION  06/01/2016   Procedure: Lower Extremity Intervention;  Surgeon: Annice Needy, MD;  Location: [facility];  Service: Cardiovascular;;   PERIPHERAL VASCULAR CATHETERIZATION Right 06/08/2016   Procedure: Lower Extremity Angiography;  Surgeon: Annice Needy, MD;  Location: [facility];  Service: Cardiovascular;  Laterality: Right;   PERIPHERAL VASCULAR CATHETERIZATION  06/08/2016   Procedure: Lower Extremity Intervention;  Surgeon: Annice Needy, MD;  Location: [facility];  Service: Cardiovascular;;   SUBMANDIBULAR GLAND EXCISION Left 12/06/2020   Procedure: EXCISION SUBMANDIBULAR GLAND;  Surgeon: Linus Salmons, MD;  Location: [facility];  Service: ENT;  Laterality: Left;  needs to be first case Diabetic - diet controlled   TEMPORARY PACEMAKER N/A 07/11/2021   Procedure: TEMPORARY PACEMAKER;  Surgeon: Marcina Millard, MD;  Location: [facility];  Service: Cardiovascular;  Laterality: N/A;    Family History  Problem Relation Age of Onset   Diabetes Mother    Hypercholesterolemia Mother    Lung cancer Father    Diabetes Sister    Diabetes Sister    Hypertension Sister    Diabetes  Maternal Grandmother    Diabetes Paternal Grandmother    Heart attack Brother    Coronary artery disease Brother    Vascular Disease Brother    Heart attack Brother    Breast cancer Neg Hx     Allergies  Allergen Reactions   Trelegy Ellipta [Fluticasone-Umeclidin-Vilant]     Had breathing issues       Latest Ref Rng & Units 07/06/2023   10:17 AM 06/18/2023    4:11 AM 06/17/2023    5:49 AM  CBC  WBC 4.0 - 10.5 K/uL 4.8  6.6  6.2   Hemoglobin 12.0 - 15.0 g/dL 9.4  7.9  7.8   Hematocrit 36.0 - 46.0 % 31.9  25.7  25.5   Platelets 150 - 400 K/uL 282  296  320       CMP     Component Value Date/Time   NA 141 07/06/2023 1016   NA 137 03/28/2020 1037   NA 141 04/17/2014 1055   K 3.5 07/06/2023 1016   K 3.9 04/17/2014 1055   CL 99 07/06/2023 1016   CL 102 04/17/2014 1055   CO2 32 07/06/2023 1016   CO2 30 04/17/2014 1055   GLUCOSE 215 (H) 07/06/2023 1016   GLUCOSE 201 (H) 04/17/2014 1055   BUN 22 07/06/2023 1016   BUN 10 03/28/2020 1037   BUN 10 04/17/2014 1055   CREATININE 0.84 07/06/2023 1016   CREATININE 0.90 04/17/2014 1055   CALCIUM 9.0 07/06/2023 1016   CALCIUM 9.1 04/17/2014 1055   PROT 6.9 07/06/2023 1016   PROT 7.6 03/28/2020 1037   PROT 8.1 04/17/2014 1055   ALBUMIN 3.9 07/06/2023 1016   ALBUMIN 4.7 03/28/2020 1037   ALBUMIN 4.1 04/17/2014 1055   AST 24 07/06/2023 1016   ALT 27 07/06/2023 1016   ALT 29 04/17/2014 1055   ALKPHOS 78 07/06/2023 1016   ALKPHOS 73 04/17/2014 1055   BILITOT 0.4 07/06/2023 1016   GFRNONAA >60 07/06/2023 1016   GFRNONAA >60 04/17/2014 1055     VAS [name]  ABI WITH/WO TBI  Result Date: 06/03/2023  LOWER EXTREMITY DOPPLER STUDY Patient Name:  Angela Jensen  Date of Exam:   06/03/2023 Medical Rec #: [id number]     Accession #:    [id number] Date of Birth: 04/16/1956     Patient Gender: F Patient Age:   86 years Exam Location:  [facility] Procedure:      VAS [name] ABI WITH/WO TBI Referring Phys: Barbara Cower DEW  --------------------------------------------------------------------------------  Indications: Claudication, and peripheral artery disease. Other Factors: Left SFA stents are known to be occluded.                Patient complains of constant numbness in left foot.  Vascular Interventions: 06/01/16: Left distal SFA/popliteal arteries PTAs with                         SFA stent x2;                         06/08/16: Left SFA PTA;                         09/29/18: Left SFA stent x2;. Performing Technologist: Hardie Lora RVT  Examination Guidelines: A complete evaluation includes at minimum, Doppler waveform signals and systolic blood pressure reading at the level of bilateral brachial, anterior tibial, and posterior tibial arteries, when vessel segments are accessible. Bilateral testing is considered an integral part of a complete examination. Photoelectric Plethysmograph (PPG) waveforms and toe systolic pressure readings are included as required and additional duplex testing as needed. Limited examinations for reoccurring indications may be performed as noted.  ABI Findings: +---------+------------------+-----+---------+--------+ Right    Rt Pressure (mmHg)IndexWaveform Comment  +---------+------------------+-----+---------+--------+ Brachial 123                                      +---------+------------------+-----+---------+--------+ PTA      101               0.82 biphasic          +---------+------------------+-----+---------+--------+ DP       110               0.89 triphasic         +---------+------------------+-----+---------+--------+ Great Toe97                0.79                   +---------+------------------+-----+---------+--------+ +---------+------------------+-----+----------+-------+ Left     Lt Pressure (mmHg)IndexWaveform  Comment +---------+------------------+-----+----------+-------+ Brachial 123                                       +---------+------------------+-----+----------+-------+ PTA      84                0.68 biphasic          +---------+------------------+-----+----------+-------+ DP       77                0.63 monophasic        +---------+------------------+-----+----------+-------+ Great Toe46                0.37                   +---------+------------------+-----+----------+-------+ +-------+-----------+-----------+------------+------------+  ABI/TBIToday's ABIToday's TBIPrevious ABIPrevious TBI +-------+-----------+-----------+------------+------------+ Right  0.89       0.79       0.95        0.74         +-------+-----------+-----------+------------+------------+ Left   0.68       0.37       0.74        0.76         +-------+-----------+-----------+------------+------------+ Bilateral ABIs appear essentially unchanged compared to prior study on 05/26/2022.  Summary: Right: Resting right ankle-brachial index indicates mild right lower extremity arterial disease. The right toe-brachial index is normal. Left: Resting left ankle-brachial index indicates moderate left lower extremity arterial disease. The left toe-brachial index is abnormal. *See table(s) above for measurements and observations.  Electronically signed by Levora Dredge MD on 06/03/2023 at 6:01:30 PM.    Final        Assessment & Plan:   1. Atherosclerosis of native artery of left lower extremity with rest pain (HCC) Recommend:  The patient has evidence of severe atherosclerotic changes of both lower extremities with rest pain that is associated with preulcerative changes and impending tissue loss of the left foot.  This represents a limb threatening ischemia and places the patient at the risk for left limb loss.  Patient should undergo angiography of the left lower extremity with the hope for intervention for limb salvage.  The risks and benefits as well as the alternative therapies was discussed in detail with the  patient.  All questions were answered.  Patient agrees to proceed with left lower extremity angiography.  The patient will follow up with me in the office after the procedure.      2. Essential hypertension Continue antihypertensive medications as already ordered, these medications have been reviewed and there are no changes at this time.  3. Diabetes mellitus due to underlying condition with hyperosmolarity without coma, with long-term current use of insulin (HCC) Continue hypoglycemic medications as already ordered, these medications have been reviewed and there are no changes at this time.  Hgb A1C to be monitored as already arranged by primary service  4. Hyperlipidemia, unspecified hyperlipidemia type Continue statin as ordered and reviewed, no changes at this time   Current Outpatient Medications on File Prior to Visit  Medication Sig Dispense Refill   Accu-Chek Softclix Lancets lancets Use 1 lancet 3 times daily to check glucose for diabetes 300 each 1   albuterol (VENTOLIN HFA) 108 (90 Base) MCG/ACT inhaler INHALE 2 PUFFS BY MOUTH EVERY 6 HOURS AS NEEDED FOR WHEEZING OR SHORTNESS OF BREATH 9 g 1   ALPRAZolam (XANAX) 0.25 MG tablet TAKE 1 TABLET BY MOUTH THREE TIMES DAILY AS NEEDED FOR ANXIETY 60 tablet 0   Blood Glucose Monitoring Suppl (ACCU-CHEK GUIDE) w/Device KIT Use as directed Dx e11.65 1 kit 0   calcium-vitamin D (OSCAL WITH D) 500-5 MG-MCG tablet Take 1 tablet by mouth 2 (two) times daily. 100 tablet 0   clopidogrel (PLAVIX) 75 MG tablet Take 1 tablet (75 mg total) by mouth daily. 90 tablet 3   Continuous Blood Gluc Receiver (DEXCOM G7 RECEIVER) DEVI Use one as directed for uncontrolled dm 1 each 0   Continuous Blood Gluc Sensor (DEXCOM G7 SENSOR) MISC Apply 1 sensor to skin on designated areas every 10 days to monitor glucose levels with sensor or smart phone app 3 each 11   cyclobenzaprine (FLEXERIL) 10 MG tablet Take 1 tablet (10 mg total) by mouth at bedtime. Take one  tab po qhs for back spasm prn only 30 tablet 3   Ensifentrine (OHTUVAYRE) 3 MG/2.5ML SUSP Inhale 3 mg into the lungs in the morning and at bedtime. 150 mL 11   escitalopram (LEXAPRO) 10 MG tablet Take 1 tablet by mouth once daily 90 tablet 1   ferrous sulfate 325 (65 FE) MG tablet Take 325 mg by mouth 2 (two) times daily with a meal.     furosemide (LASIX) 20 MG tablet Take 1 tablet (20 mg total) by mouth daily as needed. 30 tablet 5   gabapentin (NEURONTIN) 300 MG capsule Take 1 capsule (300 mg total) by mouth 3 (three) times daily. 90 capsule 3   glucose blood (ACCU-CHEK GUIDE) test strip USE TO TEST BLOOD SUGARS 5 TIMES DAILY 450 each 0   HYDROcodone-acetaminophen (NORCO/VICODIN) 5-325 MG tablet Take 1 tablet by mouth every 4 (four) hours as needed for moderate pain. 30 tablet 0   insulin detemir (LEVEMIR FLEXTOUCH) 100 UNIT/ML FlexPen Inject 10 Units into the skin daily.     Insulin Pen Needle (RELION PEN NEEDLES) 31G X 6 MM MISC USE 1 PEN NEEDLE WITH INSULIN PEN ONCE DAILY FOR DIABETES 50 each 3   ipratropium-albuterol (DUONEB) 0.5-2.5 (3) MG/3ML SOLN Take 3 mLs by nebulization every 4 (four) hours as needed. 360 mL 3   lisinopril-hydrochlorothiazide (ZESTORETIC) 20-25 MG tablet Take 1 tablet by mouth daily.     metFORMIN (GLUCOPHAGE) 500 MG tablet TAKE 2 TABLETS BY MOUTH ONCE DAILY WITH BREAKFAST 180 tablet 1   metoprolol succinate (TOPROL-XL) 25 MG 24 hr tablet Take 1 tablet (25 mg total) by mouth daily. 30 tablet 3   mometasone-formoterol (DULERA) 200-5 MCG/ACT AERO Inhale 2 puffs by mouth twice daily 13 g 12   Omega-3 Fatty Acids (FISH OIL) 1000 MG CAPS Take 1 capsule by mouth daily.     OXYGEN Inhale 4 L into the lungs. PT USES [employer] FOR OXYGEN     potassium chloride (KLOR-CON) 10 MEQ tablet Take 1 tablet (10 mEq total) by mouth every other day. 45 tablet 2   SPIRIVA HANDIHALER 18 MCG inhalation capsule INHALE THE CONTENTS OF 1 CAPSULES BY MOUTH ONCE DAILY - DO NOT SWALLOW CAPSULES  90 capsule 3   VITAMIN D, CHOLECALCIFEROL, PO Take 1 tablet by mouth daily with supper.     famotidine (PEPCID) 20 MG tablet Take 1 tablet (20 mg total) by mouth daily for 14 days. (Patient taking differently: Take 20 mg by mouth daily as needed for heartburn.) 14 tablet 0   No current facility-administered medications on file prior to visit.    There are no Patient Instructions on file for this visit. No follow-ups on file.   Georgiana Spinner, NP

## 2023-07-25 ENCOUNTER — Other Ambulatory Visit: Payer: Self-pay | Admitting: Internal Medicine

## 2023-07-25 DIAGNOSIS — E1165 Type 2 diabetes mellitus with hyperglycemia: Secondary | ICD-10-CM

## 2023-07-26 ENCOUNTER — Encounter: Payer: Self-pay | Admitting: Oncology

## 2023-07-26 NOTE — Telephone Encounter (Signed)
Please review

## 2023-07-27 ENCOUNTER — Ambulatory Visit (HOSPITAL_COMMUNITY)
Admission: RE | Admit: 2023-07-27 | Discharge: 2023-07-27 | Disposition: A | Discharge status: Home / Self Care | Payer: 59 | Source: Ambulatory Visit | Attending: Oncology | Admitting: Oncology

## 2023-07-27 DIAGNOSIS — C802 Malignant neoplasm associated with transplanted organ: Secondary | ICD-10-CM | POA: Diagnosis not present

## 2023-07-27 DIAGNOSIS — R519 Headache, unspecified: Secondary | ICD-10-CM | POA: Diagnosis not present

## 2023-07-27 DIAGNOSIS — C3491 Malignant neoplasm of unspecified part of right bronchus or lung: Secondary | ICD-10-CM | POA: Diagnosis not present

## 2023-07-27 MED ORDER — INSULIN GLARGINE (1 UNIT DIAL) 300 UNIT/ML ~~LOC~~ SOPN: 10.0000 [IU] | PEN_INJECTOR | Freq: Every day | SUBCUTANEOUS | 11 refills | Status: DC

## 2023-07-27 MED ORDER — GADOBUTROL 1 MMOL/ML IV SOLN
5.0000 mL | Freq: Once | INTRAVENOUS | Status: AC | PRN
  Administered 2023-07-27: 5 mL via INTRAVENOUS

## 2023-07-29 ENCOUNTER — Other Ambulatory Visit: Payer: Self-pay

## 2023-07-29 ENCOUNTER — Ambulatory Visit
Admission: RE | Admit: 2023-07-29 | Discharge: 2023-07-29 | Disposition: A | Discharge status: Home / Self Care | Payer: 59 | Attending: Vascular Surgery | Admitting: Vascular Surgery

## 2023-07-29 ENCOUNTER — Encounter
Admission: RE | Disposition: A | Discharge status: Home / Self Care | Payer: Self-pay | Source: Home / Self Care | Attending: Vascular Surgery

## 2023-07-29 DIAGNOSIS — E785 Hyperlipidemia, unspecified: Secondary | ICD-10-CM | POA: Diagnosis not present

## 2023-07-29 DIAGNOSIS — Z794 Long term (current) use of insulin: Secondary | ICD-10-CM | POA: Insufficient documentation

## 2023-07-29 DIAGNOSIS — I110 Hypertensive heart disease with heart failure: Secondary | ICD-10-CM | POA: Insufficient documentation

## 2023-07-29 DIAGNOSIS — E1151 Type 2 diabetes mellitus with diabetic peripheral angiopathy without gangrene: Secondary | ICD-10-CM | POA: Diagnosis not present

## 2023-07-29 DIAGNOSIS — Z87891 Personal history of nicotine dependence: Secondary | ICD-10-CM | POA: Diagnosis not present

## 2023-07-29 DIAGNOSIS — I70229 Atherosclerosis of native arteries of extremities with rest pain, unspecified extremity: Secondary | ICD-10-CM

## 2023-07-29 DIAGNOSIS — T82868A Thrombosis of vascular prosthetic devices, implants and grafts, initial encounter: Secondary | ICD-10-CM

## 2023-07-29 DIAGNOSIS — I70222 Atherosclerosis of native arteries of extremities with rest pain, left leg: Secondary | ICD-10-CM | POA: Diagnosis not present

## 2023-07-29 DIAGNOSIS — Z833 Family history of diabetes mellitus: Secondary | ICD-10-CM | POA: Insufficient documentation

## 2023-07-29 DIAGNOSIS — Z8249 Family history of ischemic heart disease and other diseases of the circulatory system: Secondary | ICD-10-CM | POA: Insufficient documentation

## 2023-07-29 HISTORY — PX: LOWER EXTREMITY ANGIOGRAPHY: CATH118251

## 2023-07-29 LAB — CREATININE, SERUM
Creatinine, Ser: 0.68 mg/dL (ref 0.44–1.00)
GFR, Estimated: >60 mL/min (ref >60)

## 2023-07-29 LAB — BUN: BUN: 19 mg/dL (ref 8–23)

## 2023-07-29 LAB — GLUCOSE, CAPILLARY: Glucose-Capillary: 126 mg/dL — ABNORMAL HIGH (ref 70–99)

## 2023-07-29 SURGERY — LOWER EXTREMITY ANGIOGRAPHY
Anesthesia: Moderate Sedation | Site: Leg Lower | Laterality: Left

## 2023-07-29 MED ORDER — HYDROMORPHONE HCL 1 MG/ML IJ SOLN: 1.0000 mg | Freq: Once | INTRAMUSCULAR | Status: DC | PRN

## 2023-07-29 MED ORDER — ATORVASTATIN CALCIUM 10 MG PO TABS: 10.0000 mg | ORAL_TABLET | Freq: Every day | ORAL | 11 refills | Status: AC

## 2023-07-29 MED ORDER — ASPIRIN 81 MG PO TBEC
DELAYED_RELEASE_TABLET | ORAL | Status: AC
  Filled 2023-07-29: qty 1

## 2023-07-29 MED ORDER — LIDOCAINE-EPINEPHRINE (PF) 1 %-1:200000 IJ SOLN
INTRAMUSCULAR | Status: DC | PRN
  Administered 2023-07-29: 10 mL via INTRADERMAL

## 2023-07-29 MED ORDER — SODIUM CHLORIDE 0.9% FLUSH
3.0000 mL | Freq: Two times a day (BID) | INTRAVENOUS | Status: DC
Start: 2023-07-29 — End: 2023-07-29

## 2023-07-29 MED ORDER — DIPHENHYDRAMINE HCL 50 MG/ML IJ SOLN: 50.0000 mg | Freq: Once | INTRAMUSCULAR | Status: DC | PRN

## 2023-07-29 MED ORDER — HEPARIN SODIUM (PORCINE) 1000 UNIT/ML IJ SOLN
INTRAMUSCULAR | Status: AC
  Filled 2023-07-29: qty 10

## 2023-07-29 MED ORDER — SODIUM CHLORIDE 0.9 % IV SOLN: INTRAVENOUS | Status: DC

## 2023-07-29 MED ORDER — MIDAZOLAM HCL 2 MG/ML PO SYRP: 8.0000 mg | ORAL_SOLUTION | Freq: Once | ORAL | Status: DC | PRN

## 2023-07-29 MED ORDER — METHYLPREDNISOLONE SODIUM SUCC 125 MG IJ SOLR: 125.0000 mg | Freq: Once | INTRAMUSCULAR | Status: DC | PRN

## 2023-07-29 MED ORDER — ONDANSETRON HCL 4 MG/2ML IJ SOLN: 4.0000 mg | Freq: Four times a day (QID) | INTRAMUSCULAR | Status: DC | PRN

## 2023-07-29 MED ORDER — SODIUM CHLORIDE 0.9 % IV SOLN: 250.0000 mL | INTRAVENOUS | Status: DC | PRN

## 2023-07-29 MED ORDER — IODIXANOL 320 MG/ML IV SOLN
INTRAVENOUS | Status: DC | PRN
  Administered 2023-07-29: 50 mL via INTRA_ARTERIAL

## 2023-07-29 MED ORDER — MIDAZOLAM HCL 2 MG/2ML IJ SOLN
INTRAMUSCULAR | Status: DC | PRN
  Administered 2023-07-29 (×2): .5 mg via INTRAVENOUS
  Administered 2023-07-29: 1 mg via INTRAVENOUS

## 2023-07-29 MED ORDER — FENTANYL CITRATE (PF) 100 MCG/2ML IJ SOLN
INTRAMUSCULAR | Status: DC | PRN
  Administered 2023-07-29: 50 ug via INTRAVENOUS
  Administered 2023-07-29: 12.5 ug via INTRAVENOUS

## 2023-07-29 MED ORDER — CEFAZOLIN SODIUM-DEXTROSE 2-4 GM/100ML-% IV SOLN
INTRAVENOUS | Status: AC
  Filled 2023-07-29: qty 100

## 2023-07-29 MED ORDER — FAMOTIDINE 20 MG PO TABS: 40.0000 mg | ORAL_TABLET | Freq: Once | ORAL | Status: DC | PRN

## 2023-07-29 MED ORDER — HEPARIN (PORCINE) IN NACL 1000-0.9 UT/500ML-% IV SOLN
INTRAVENOUS | Status: DC | PRN
  Administered 2023-07-29: 1000 mL

## 2023-07-29 MED ORDER — HEPARIN SODIUM (PORCINE) 1000 UNIT/ML IJ SOLN
INTRAMUSCULAR | Status: DC | PRN
  Administered 2023-07-29: 5000 [IU] via INTRAVENOUS

## 2023-07-29 MED ORDER — FENTANYL CITRATE (PF) 100 MCG/2ML IJ SOLN
INTRAMUSCULAR | Status: AC
  Filled 2023-07-29: qty 2

## 2023-07-29 MED ORDER — ASPIRIN 81 MG PO TBEC
81.0000 mg | DELAYED_RELEASE_TABLET | Freq: Every day | ORAL | Status: DC
Start: 2023-07-29 — End: 2023-07-29
  Administered 2023-07-29: 81 mg via ORAL

## 2023-07-29 MED ORDER — CEFAZOLIN SODIUM-DEXTROSE 2-4 GM/100ML-% IV SOLN
2.0000 g | INTRAVENOUS | Status: AC
  Administered 2023-07-29: 2 g via INTRAVENOUS

## 2023-07-29 MED ORDER — ATORVASTATIN CALCIUM 10 MG PO TABS
10.0000 mg | ORAL_TABLET | Freq: Every day | ORAL | Status: DC
  Filled 2023-07-29: qty 1

## 2023-07-29 MED ORDER — ACETAMINOPHEN 325 MG PO TABS: 650.0000 mg | ORAL_TABLET | ORAL | Status: DC | PRN

## 2023-07-29 MED ORDER — MIDAZOLAM HCL 5 MG/5ML IJ SOLN
INTRAMUSCULAR | Status: AC
  Filled 2023-07-29: qty 5

## 2023-07-29 MED ORDER — ASPIRIN 81 MG PO TBEC: 81.0000 mg | DELAYED_RELEASE_TABLET | Freq: Every day | ORAL | 2 refills | Status: AC

## 2023-07-29 MED ORDER — SODIUM CHLORIDE 0.9% FLUSH: 3.0000 mL | INTRAVENOUS | Status: DC | PRN

## 2023-07-29 MED ORDER — LABETALOL HCL 5 MG/ML IV SOLN
10.0000 mg | INTRAVENOUS | Status: DC | PRN
Start: 2023-07-29 — End: 2023-07-29

## 2023-07-29 MED ORDER — HYDRALAZINE HCL 20 MG/ML IJ SOLN: 5.0000 mg | INTRAMUSCULAR | Status: DC | PRN

## 2023-07-29 SURGICAL SUPPLY — 19 items
BALLN LUTONIX 018 5X60X130 (BALLOONS) ×1
BALLN LUTONIX 018 6X60X130 (BALLOONS) ×1
BALLOON LUTONIX 018 5X60X130 (BALLOONS) IMPLANT
BALLOON LUTONIX 018 6X60X130 (BALLOONS) IMPLANT
CATH ANGIO 5F PIGTAIL 65CM (CATHETERS) IMPLANT
CATH BEACON 5 .038 100 VERT TP (CATHETERS) IMPLANT
CATH ROTAREX 135 6FR (CATHETERS) IMPLANT
COVER PROBE ULTRASOUND 5X96 (MISCELLANEOUS) IMPLANT
DEVICE PRESTO INFLATION (MISCELLANEOUS) IMPLANT
DEVICE STARCLOSE SE CLOSURE (Vascular Products) IMPLANT
GLIDEWIRE ADV .035X260CM (WIRE) IMPLANT
PACK ANGIOGRAPHY (CUSTOM PROCEDURE TRAY) ×1 IMPLANT
SHEATH BRITE TIP 5FRX11 (SHEATH) IMPLANT
SHEATH PINNACLE MP 6F 45CM (SHEATH) IMPLANT
STENT VIABAHN 6X50X120 (Permanent Stent) IMPLANT
SYR MEDRAD MARK 7 150ML (SYRINGE) IMPLANT
TUBING CONTRAST HIGH PRESS 72 (TUBING) IMPLANT
WIRE G V18X300CM (WIRE) IMPLANT
WIRE GUIDERIGHT .035X150 (WIRE) IMPLANT

## 2023-07-29 NOTE — Interval H&P Note (Signed)
History and Physical Interval Note:  07/29/2023 11:12 AM  Angela Jensen  has presented today for surgery, with the diagnosis of LLE Angio   ASO w rest pain.  The various methods of treatment have been discussed with the patient and family. After consideration of risks, benefits and other options for treatment, the patient has consented to  Procedure(s): Lower Extremity Angiography (Left) as a surgical intervention.  The patient's history has been reviewed, patient examined, no change in status, stable for surgery.  I have reviewed the patient's chart and labs.  Questions were answered to the patient's satisfaction.     Festus Barren

## 2023-07-29 NOTE — Op Note (Signed)
[facility]  Percutaneous Study/Intervention Procedural Note   Date of Surgery: 07/29/2023  Surgeon(s):Ajia Chadderdon    Assistants:none  Pre-operative Diagnosis: PAD with rest pain, acute on chronic ischemia  Post-operative diagnosis:  Same  Procedure(s) Performed:             1.  Ultrasound guidance for vascular access right femoral artery             2.  Catheter placement into left common femoral artery from right femoral approach             3.  Aortogram and selective left lower extremity angiogram             4.  Mechanical thrombectomy of the left SFA and popliteal arteries with the [place] Rex device             5.  Stent placement to the proximal left SFA with 6 mm diameter by 5 cm length Viabahn stent  6.  Stent placement to the left popliteal artery above the knee with 6 mm diameter by 5 cm length Viabahn stent             7.  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 The patient has noninvasive study showing significant reduction of the right ABI with monophasic waveforms consistent with thrombosis of her previous stents. The patient is brought in for angiography for further evaluation and potential treatment.  Due to the limb threatening nature of the situation, angiogram was performed for attempted limb salvage. The patient is aware that if the procedure fails, amputation would be expected.  The patient also understands that even with successful revascularization, amputation may still be required due to the severity of the situation.  Risks and benefits are discussed and informed consent is obtained.   Procedure:  The  patient was identified and appropriate procedural time out was performed.  The patient was then placed supine on the table and prepped and draped in the usual sterile fashion. Moderate conscious sedation was administered during a face to face encounter with the patient throughout the procedure with my supervision of the RN administering medicines and monitoring the patient's vital signs, pulse oximetry, telemetry and mental status throughout from the start of the procedure until the patient was taken to the recovery room. Ultrasound was used to evaluate the right common femoral artery.  It was patent .  A digital ultrasound image was acquired.  A Seldinger needle was used to access the right common femoral artery under direct ultrasound guidance and a permanent image was performed.  A 0.035 J wire was advanced without resistance and a 5Fr sheath was placed.  Pigtail catheter was placed into the aorta and an AP aortogram was performed. This demonstrated normal renal arteries and normal aorta.  The iliac arteries were fairly small.  Bowel gas obscured some of the visualization and there appeared to be mild disease but nothing that was obviously flow-limiting on either side. I then crossed the aortic bifurcation and advanced to the left femoral head. Selective left lower extremity angiogram was  then performed. This demonstrated flush occlusion with thrombosis of the left SFA stents down to Hunter's canal where there was reconstitution of the above-knee popliteal artery through profunda collaterals.  There was some disease in the common femoral artery in the proximal to mid segment at least approaching 50%.  The remainder of the popliteal artery was fairly normal and there was three-vessel runoff distally without focal tibial stenosis. It was felt that it was in the patient's best interest to proceed with intervention after these images to avoid a second procedure and a larger amount of contrast and fluoroscopy based  off of the findings from the initial angiogram. The patient was systemically heparinized and a 6 [place] destination sheath was then placed over the Terumo Advantage wire. I then used a Kumpe catheter and the advantage wire to easily get into the left SFA which was not surprising given the thrombotic nature of the occlusion and not atherosclerotic disease.  I crossed the occlusion without difficulty and confirmed intraluminal flow in the popliteal artery.  I then placed a V18 wire and parked this in the posterior tibial artery.  Thrombectomy was performed initially.  The [place] Rex mechanical thrombectomy device was brought onto the field and used perform mechanical thrombectomy of the entire left SFA and proximal popliteal artery.  2 passes were made initially with marked improvement but there remained thrombus both proximally distally.  2 additional passes were made concentrating on the proximal edge of the stent at the origin of the SFA and the distal edge of the stent at Hunter's canal.  Despite thrombectomy there remained greater than 50% residual stenosis in both locations from a combination of hyperplasia and thrombus and so I elected to place covered stents in these areas.  A 6 mm diameter by 5 cm length Viabahn stent was deployed in the proximal left SFA up to the origin just a few millimeters above the previous stent.  This was postdilated with a 6 mm diameter Lutonix drug-coated balloon.  Another 6 mm diameter by 5 cm length Viabahn stent was placed distally at Hunter's canal and the most distal SFA and proximal popliteal artery.  This was postdilated with a 5 mm diameter Lutonix drug-coated balloon.  Completion imaging following this showed less than 20% residual stenosis in both locations with good flow distally and preserved runoff. I elected to terminate the procedure. The sheath was removed and StarClose closure device was deployed in the right femoral artery with excellent hemostatic result. The  patient was taken to the recovery room in stable condition having tolerated the procedure well.  Findings:               Aortogram:  This demonstrated normal renal arteries and normal aorta.  The iliac arteries were fairly small.  Bowel gas obscured some of the visualization and there appeared to be mild disease but nothing that was obviously flow-limiting on either side.             Left lower Extremity:  This demonstrated flush occlusion with thrombosis of the left SFA stents down to Hunter's canal where there was reconstitution of the above-knee popliteal artery through profunda collaterals.  There was some disease in the common femoral artery in the proximal to mid segment at least approaching 50%.  The remainder of the popliteal artery was fairly normal and there was three-vessel runoff distally without focal tibial stenosis.   Disposition: Patient was taken to the recovery room in stable condition having tolerated the procedure well.  Complications: None  Festus Barren 07/29/2023 2:48 PM   This note was created with Dragon Medical transcription system. Any errors in dictation are purely unintentional.

## 2023-07-30 ENCOUNTER — Encounter: Payer: Self-pay | Admitting: Vascular Surgery

## 2023-08-02 ENCOUNTER — Encounter: Payer: Self-pay | Admitting: Vascular Surgery

## 2023-08-02 ENCOUNTER — Telehealth: Payer: Self-pay | Admitting: Nurse Practitioner

## 2023-08-02 ENCOUNTER — Telehealth: Payer: Self-pay | Admitting: Physician Assistant

## 2023-08-02 NOTE — Telephone Encounter (Signed)
Received O2 recertification. Gave to [employer]

## 2023-08-02 NOTE — Telephone Encounter (Addendum)
O2 recertification signed by AA. Faxed back to [employer] with 06/28/23 office note; [phone]. Scanned. Sheralyn Boatman

## 2023-08-04 ENCOUNTER — Other Ambulatory Visit: Payer: Self-pay | Admitting: Internal Medicine

## 2023-08-04 DIAGNOSIS — F419 Anxiety disorder, unspecified: Secondary | ICD-10-CM

## 2023-08-05 NOTE — Telephone Encounter (Signed)
Next appt 08/12/23

## 2023-08-06 DIAGNOSIS — I10 Essential (primary) hypertension: Secondary | ICD-10-CM | POA: Diagnosis not present

## 2023-08-06 DIAGNOSIS — R0602 Shortness of breath: Secondary | ICD-10-CM | POA: Diagnosis not present

## 2023-08-06 DIAGNOSIS — J449 Chronic obstructive pulmonary disease, unspecified: Secondary | ICD-10-CM | POA: Diagnosis not present

## 2023-08-12 ENCOUNTER — Ambulatory Visit (INDEPENDENT_AMBULATORY_CARE_PROVIDER_SITE_OTHER): Payer: 59 | Admitting: Nurse Practitioner

## 2023-08-12 ENCOUNTER — Encounter: Payer: Self-pay | Admitting: Nurse Practitioner

## 2023-08-12 VITALS — BP 110/70 | HR 98 | Temp 98.1°F | Resp 16 | Ht 59.0 in | Wt 126.4 lb

## 2023-08-12 DIAGNOSIS — I739 Peripheral vascular disease, unspecified: Secondary | ICD-10-CM

## 2023-08-12 DIAGNOSIS — I152 Hypertension secondary to endocrine disorders: Secondary | ICD-10-CM

## 2023-08-12 DIAGNOSIS — Z0000 Encounter for general adult medical examination without abnormal findings: Secondary | ICD-10-CM | POA: Diagnosis not present

## 2023-08-12 DIAGNOSIS — G8929 Other chronic pain: Secondary | ICD-10-CM

## 2023-08-12 DIAGNOSIS — E1159 Type 2 diabetes mellitus with other circulatory complications: Secondary | ICD-10-CM

## 2023-08-12 DIAGNOSIS — E785 Hyperlipidemia, unspecified: Secondary | ICD-10-CM | POA: Diagnosis not present

## 2023-08-12 DIAGNOSIS — Z794 Long term (current) use of insulin: Secondary | ICD-10-CM

## 2023-08-12 DIAGNOSIS — E1169 Type 2 diabetes mellitus with other specified complication: Secondary | ICD-10-CM

## 2023-08-12 DIAGNOSIS — F419 Anxiety disorder, unspecified: Secondary | ICD-10-CM

## 2023-08-12 DIAGNOSIS — M25511 Pain in right shoulder: Secondary | ICD-10-CM | POA: Diagnosis not present

## 2023-08-12 DIAGNOSIS — H9113 Presbycusis, bilateral: Secondary | ICD-10-CM

## 2023-08-12 DIAGNOSIS — F32A Depression, unspecified: Secondary | ICD-10-CM

## 2023-08-12 MED ORDER — CYCLOBENZAPRINE HCL 10 MG PO TABS: 10.0000 mg | ORAL_TABLET | Freq: Every day | ORAL | 3 refills | Status: DC

## 2023-08-12 MED ORDER — ESCITALOPRAM OXALATE 10 MG PO TABS: 10.0000 mg | ORAL_TABLET | Freq: Every day | ORAL | 1 refills | Status: DC

## 2023-08-12 NOTE — Progress Notes (Cosign Needed)
[facility] [address] [place], [place] [address]  Internal MEDICINE  Office Visit Note  Patient Name: Angela Jensen  [id number]  [id number]  Date of Service: 08/12/2023  Chief Complaint  Patient presents with   Medicare Wellness   Depression   Diabetes   Hypertension   Hyperlipidemia    HPI Angela Jensen presents for an annual well visit and physical exam.  Well-appearing 67 y.o. female with hypertension, diabetes, COPD, high cholesterol and history of anemia.  Routine CRC screening: due in 2026 Routine mammogram: due in February  DEXA scan: done in 2023 Eye exam: due in January 2025 foot exam: done today  Labs: due for routine labs esp lipid panel.  New or worsening pain: chronic pain in right leg.  Other concerns: insurance will not cover ohtuvayre, will try again starting next year.  Recently had stents in legs redone, right leg has been hurting more lately, will try muscle relaxer that she still has at home.  Hard of hearing, wants to be evaluated for hearing aids.  On 3 LPM of continuous supplemental oxygen, doing well.   Angela Jensen's providers: Sallyanne Kuster FNP-C -- PCP Dr. Festus Barren -- vascular surgery Dr. Rickard Patience -- heme/onc Dr. Freda Munro -- pulmonologist  Dr. Dorothyann Peng -- cardiologist Dr. Carmina Miller -- radiation oncologist.       08/12/2023    2:01 PM 08/06/2022    1:57 PM  MMSE - Mini Mental State Exam  Orientation to time 5 5  Orientation to Place 5 5  Registration 3 3  Attention/ Calculation 5 5  Recall 3 3  Language- name 2 objects 2 2  Language- repeat 1 1  Language- follow 3 step command 3 3  Language- read & follow direction 1 1  Write a sentence 1 1  Copy design 1 1  Total score 30 30    Functional Status Survey: Is the patient deaf or have difficulty hearing?: Yes Does the patient have difficulty seeing, even when wearing glasses/contacts?: No Does the patient have difficulty concentrating, remembering, or making  decisions?: No Does the patient have difficulty walking or climbing stairs?: No Does the patient have difficulty dressing or bathing?: No Does the patient have difficulty doing errands alone such as visiting a doctor's office or shopping?: No     06/09/2022    1:37 PM 06/22/2022    2:00 PM 08/06/2022    1:56 PM 10/08/2022    2:41 PM 08/12/2023    2:00 PM  Fall Risk  Falls in the past year? 0  0 0 0  Was there an injury with Fall? 0   0   Fall Risk Category Calculator 0   0   Fall Risk Category (Retired) Low      (RETIRED) Patient Fall Risk Level Low fall risk Low fall risk     Patient at Risk for Falls Due to No Fall Risks   No Fall Risks   Fall risk Follow up Falls evaluation completed   Falls evaluation completed        08/12/2023    2:00 PM  Depression screen PHQ 2/9  Decreased Interest 0  Down, Depressed, Hopeless 0  PHQ - 2 Score 0       Current Medication: Outpatient Encounter Medications as of 08/12/2023  Medication Sig   Accu-Chek Softclix Lancets lancets Use 1 lancet 3 times daily to check glucose for diabetes   albuterol (VENTOLIN HFA) 108 (90 Base) MCG/ACT inhaler INHALE 2 PUFFS  BY MOUTH EVERY 6 HOURS AS NEEDED FOR WHEEZING OR SHORTNESS OF BREATH   ALPRAZolam (XANAX) 0.25 MG tablet Take one tab po bid prn for anxiety   aspirin EC 81 MG tablet Take 1 tablet (81 mg total) by mouth daily. Swallow whole.   atorvastatin (LIPITOR) 10 MG tablet Take 1 tablet (10 mg total) by mouth daily.   Blood Glucose Monitoring Suppl (ACCU-CHEK GUIDE) w/Device KIT Use as directed Dx e11.65   calcium-vitamin D (OSCAL WITH D) 500-5 MG-MCG tablet Take 1 tablet by mouth 2 (two) times daily.   clopidogrel (PLAVIX) 75 MG tablet Take 1 tablet (75 mg total) by mouth daily.   Continuous Blood Gluc Receiver (DEXCOM G7 RECEIVER) DEVI Use one as directed for uncontrolled dm   Continuous Blood Gluc Sensor (DEXCOM G7 SENSOR) MISC Apply 1 sensor to skin on designated areas every 10 days to monitor  glucose levels with sensor or smart phone app   Ensifentrine (OHTUVAYRE) 3 MG/2.5ML SUSP Inhale 3 mg into the lungs in the morning and at bedtime.   ferrous sulfate 325 (65 FE) MG tablet Take 325 mg by mouth 2 (two) times daily with a meal.   furosemide (LASIX) 20 MG tablet Take 1 tablet (20 mg total) by mouth daily as needed.   gabapentin (NEURONTIN) 300 MG capsule Take 1 capsule (300 mg total) by mouth 3 (three) times daily.   glucose blood (ACCU-CHEK GUIDE) test strip USE TO TEST BLOOD SUGARS 5 TIMES DAILY   HYDROcodone-acetaminophen (NORCO/VICODIN) 5-325 MG tablet Take 1 tablet by mouth every 4 (four) hours as needed for moderate pain.   insulin glargine, 1 Unit Dial, (TOUJEO) 300 UNIT/ML Solostar Pen Inject 10 Units into the skin daily.   Insulin Pen Needle (RELION PEN NEEDLES) 31G X 6 MM MISC USE 1 PEN NEEDLE WITH INSULIN PEN ONCE DAILY FOR DIABETES   ipratropium-albuterol (DUONEB) 0.5-2.5 (3) MG/3ML SOLN Take 3 mLs by nebulization every 4 (four) hours as needed.   lisinopril-hydrochlorothiazide (ZESTORETIC) 20-25 MG tablet Take 1 tablet by mouth daily.   metFORMIN (GLUCOPHAGE) 500 MG tablet TAKE 2 TABLETS BY MOUTH ONCE DAILY WITH BREAKFAST   metoprolol succinate (TOPROL-XL) 25 MG 24 hr tablet Take 1 tablet (25 mg total) by mouth daily.   mometasone-formoterol (DULERA) 200-5 MCG/ACT AERO Inhale 2 puffs by mouth twice daily   Omega-3 Fatty Acids (FISH OIL) 1000 MG CAPS Take 1 capsule by mouth daily.   OXYGEN Inhale 4 L into the lungs. PT USES [facility] FOR OXYGEN   potassium chloride (KLOR-CON) 10 MEQ tablet Take 1 tablet (10 mEq total) by mouth every other day.   SPIRIVA HANDIHALER 18 MCG inhalation capsule INHALE THE CONTENTS OF 1 CAPSULES BY MOUTH ONCE DAILY - DO NOT SWALLOW CAPSULES   vitamin B-12 (CYANOCOBALAMIN) 100 MCG tablet Take 100 mcg by mouth daily.   VITAMIN D, CHOLECALCIFEROL, PO Take 1 tablet by mouth daily with supper.   [DISCONTINUED] cyclobenzaprine (FLEXERIL) 10 MG  tablet Take 1 tablet (10 mg total) by mouth at bedtime. Take one tab po qhs for back spasm prn only   [DISCONTINUED] escitalopram (LEXAPRO) 10 MG tablet Take 1 tablet by mouth once daily   cyclobenzaprine (FLEXERIL) 10 MG tablet Take 1 tablet (10 mg total) by mouth at bedtime. Take one tab po qhs for back spasm prn only   escitalopram (LEXAPRO) 10 MG tablet Take 1 tablet (10 mg total) by mouth daily.   famotidine (PEPCID) 20 MG tablet Take 1 tablet (20 mg total)  by mouth daily for 14 days. (Patient taking differently: Take 20 mg by mouth daily as needed for heartburn.)   No facility-administered encounter medications on file as of 08/12/2023.    Surgical History: Past Surgical History:  Procedure Laterality Date   CESAREAN SECTION     x3   COLONOSCOPY WITH PROPOFOL N/A 06/25/2015   Procedure: COLONOSCOPY WITH PROPOFOL;  Surgeon: Midge Minium, MD;  Location: [facility] ENDOS[facility];  Service: Endoscopy;  Laterality: N/A;   COLONOSCOPY WITH PROPOFOL N/A 07/26/2020   Procedure: COLONOSCOPY WITH PROPOFOL;  Surgeon: Midge Minium, MD;  Location: [facility];  Service: Endoscopy;  Laterality: N/A;   CYST REMOVAL LEG     and on shoulder    ESOPHAGOGASTRODUODENOSCOPY (EGD) WITH PROPOFOL N/A 07/26/2020   Procedure: ESOPHAGOGASTRODUODENOSCOPY (EGD) WITH PROPOFOL;  Surgeon: Midge Minium, MD;  Location: [facility];  Service: Endoscopy;  Laterality: N/A;  Diabetic - oral meds   LOWER EXTREMITY ANGIOGRAPHY Left 09/29/2018   Procedure: LOWER EXTREMITY ANGIOGRAPHY;  Surgeon: Annice Needy, MD;  Location: [facility] INV[facility] CV [facility];  Service: Cardiovascular;  Laterality: Left;   LOWER EXTREMITY ANGIOGRAPHY Left 07/29/2023   Procedure: Lower Extremity Angiography;  Surgeon: Annice Needy, MD;  Location: [facility] IN[facility] CV [facility];  Service: Cardiovascular;  Laterality: Left;   LUNG BIOPSY  12/30/2011   has lung "spots"   PACEMAKER IMPLANT  07/14/2021   PACEMAKER LEADLESS INSERTION N/A 07/14/2021   Procedure:  PACEMAKER LEADLESS INSERTION;  Surgeon: Marcina Millard, MD;  Location: [facility];  Service: Cardiovascular;  Laterality: N/A;   PERIPHERAL VASCULAR CATHETERIZATION Left 06/01/2016   Procedure: Lower Extremity Angiography;  Surgeon: Annice Needy, MD;  Location: [facility];  Service: Cardiovascular;  Laterality: Left;   PERIPHERAL VASCULAR CATHETERIZATION N/A 06/01/2016   Procedure: Abdominal Aortogram w/Lower Extremity;  Surgeon: Annice Needy, MD;  Location: [facility];  Service: Cardiovascular;  Laterality: N/A;   PERIPHERAL VASCULAR CATHETERIZATION  06/01/2016   Procedure: Lower Extremity Intervention;  Surgeon: Annice Needy, MD;  Location: [facility];  Service: Cardiovascular;;   PERIPHERAL VASCULAR CATHETERIZATION Right 06/08/2016   Procedure: Lower Extremity Angiography;  Surgeon: Annice Needy, MD;  Location: [facility];  Service: Cardiovascular;  Laterality: Right;   PERIPHERAL VASCULAR CATHETERIZATION  06/08/2016   Procedure: Lower Extremity Intervention;  Surgeon: Annice Needy, MD;  Location: [facility];  Service: Cardiovascular;;   SUBMANDIBULAR GLAND EXCISION Left 12/06/2020   Procedure: EXCISION SUBMANDIBULAR GLAND;  Surgeon: Linus Salmons, MD;  Location: [facility];  Service: ENT;  Laterality: Left;  needs to be first case Diabetic - diet controlled   TEMPORARY PACEMAKER N/A 07/11/2021   Procedure: TEMPORARY PACEMAKER;  Surgeon: Marcina Millard, MD;  Location: [facility] CV [facility];  Service: Cardiovascular;  Laterality: N/A;    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/2016   Cancer (HCC) 2012   Right Lung CA   COPD (chronic obstructive pulmonary disease) (HCC)    Depression    Diabetes mellitus without complication (HCC)    Patient takes Janumet   Essential  hypertension 05/26/2016   Heart failure (HCC) 2022   Hydropneumothorax 05/03/2020   Hypercholesteremia    Oxygen dependent    2L at nite    PAD (peripheral artery disease) (HCC) 06/22/2016   Peripheral vascular disease (  HCC)    Personal history of radiation therapy    Shortness of breath dyspnea    with exertion    Sialolithiasis    Sleep apnea    Wears dentures    full upper and lower    Family History: Family History  Problem Relation Age of Onset   Diabetes Mother    Hypercholesterolemia Mother    Lung cancer Father    Diabetes Sister    Diabetes Sister    Hypertension Sister    Diabetes Maternal Grandmother    Diabetes Paternal Grandmother    Heart attack Brother    Coronary artery disease Brother    Vascular Disease Brother    Heart attack Brother    Breast cancer Neg Hx     Social History   Socioeconomic History   Marital status: Widowed    Spouse name: Not on file   Number of children: Not on file   Years of education: Not on file   Highest education level: Not on file  Occupational History   Not on file  Tobacco Use   Smoking status: Former    Current packs/day: 0.00    Average packs/day: 1 pack/day for 37.0 years (37.0 ttl pk-yrs)    Types: Cigarettes    Start date: 02/06/1973    Quit date: 02/06/2010    Years since quitting: 13.5   Smokeless tobacco: Former    Types: Snuff  Vaping Use   Vaping status: Never Used  Substance and Sexual Activity   Alcohol use: Not Currently    Alcohol/week: 5.0 standard drinks of alcohol    Types: 5 Cans of beer per week    Comment: /h x of alcohol abuse -stopped 2012- now drinks 5 beer per week   Drug use: Not Currently    Types: Marijuana, "Crack" cocaine, Cocaine    Comment: hx of cocaine use- last use 2015; last use marijuana6/22/19,   Sexual activity: Yes  Other Topics Concern   Not on file  Social History Narrative   Lives with Significant Other x 43 years   Social Drivers of Research scientist (physical sciences) Strain: Not on file  Food Insecurity: No Food Insecurity (06/15/2023)   Hunger Vital Sign    Worried About Running Out of Food in the Last Year: Never true    Ran Out of Food in the Last Year: Never true  Transportation Needs: No Transportation Needs (06/15/2023)   PRAPARE - Administrator, Civil Service (Medical): No    Lack of Transportation (Non-Medical): No  Physical Activity: Not on file  Stress: Not on file  Social Connections: Not on file  Intimate Partner Violence: Not At Risk (06/15/2023)   Humiliation, Afraid, Rape, and Kick questionnaire    Fear of Current or Ex-Partner: No    Emotionally Abused: No    Physically Abused: No    Sexually Abused: No      Review of Systems  Constitutional:  Negative for activity change, appetite change, chills, fatigue, fever and unexpected weight change.  HENT: Negative.  Negative for congestion, ear pain, rhinorrhea, sore throat and trouble swallowing.   Eyes: Negative.   Respiratory: Negative.  Negative for cough, chest tightness, shortness of breath and wheezing.   Cardiovascular: Negative.  Negative for chest pain and palpitations.       Has pacemaker  Gastrointestinal: Negative.  Negative for abdominal pain, blood in stool, constipation, diarrhea, nausea and vomiting.  Endocrine: Negative.   Genitourinary: Negative.  Negative for difficulty urinating, dysuria, frequency, hematuria and urgency.  Musculoskeletal: Negative.  Negative for arthralgias, back pain, joint swelling, myalgias and neck pain.  Skin: Negative.  Negative for rash and wound.  Allergic/Immunologic: Negative.  Negative for immunocompromised state.  Neurological: Negative.  Negative for dizziness, seizures, numbness and headaches.  Hematological: Negative.   Psychiatric/Behavioral: Negative.  Negative for behavioral problems, self-injury and suicidal ideas. The patient is not nervous/anxious.     Vital Signs: BP 110/70   Pulse 98   Temp 98.1  F (36.7 C)   Resp 16   Ht 4\' 11"  (1.499 m)   Wt 126 lb 6.4 oz (57.3 kg)   SpO2 92%   BMI 25.53 kg/m    Physical Exam Vitals reviewed.  Constitutional:      General: She is not in acute distress.    Appearance: Normal appearance. She is well-developed. She is not ill-appearing or diaphoretic.  HENT:     Head: Normocephalic and atraumatic.     Right Ear: Tympanic membrane, ear canal and external ear normal. There is no impacted cerumen.     Left Ear: Tympanic membrane, ear canal and external ear normal. There is no impacted cerumen.  Eyes:     Extraocular Movements: Extraocular movements intact.     Conjunctiva/sclera: Conjunctivae normal.     Pupils: Pupils are equal, round, and reactive to light.  Neck:     Thyroid: No thyromegaly.     Vascular: No JVD.     Trachea: No tracheal deviation.  Cardiovascular:     Rate and Rhythm: Normal rate and regular rhythm.     Pulses: Normal pulses.     Heart sounds: Normal heart sounds. No murmur heard.    No friction rub. No gallop.  Pulmonary:     Effort: Pulmonary effort is normal. No respiratory distress.     Breath sounds: Normal breath sounds. No stridor. No wheezing or rales.  Chest:     Chest wall: No tenderness.  Skin:    General: Skin is warm and dry.     Capillary Refill: Capillary refill takes less than 2 seconds.  Neurological:     Mental Status: She is alert and oriented to person, place, and time.     Cranial Nerves: No cranial nerve deficit.     Motor: No abnormal muscle tone.     Coordination: Coordination normal.     Gait: Gait normal.     Deep Tendon Reflexes: Reflexes are normal and symmetric.  Psychiatric:        Mood and Affect: Mood normal.        Behavior: Behavior normal.        Assessment/Plan: 1. Encounter for subsequent annual wellness visit (AWV) in Medicare patient (Primary) Age-appropriate preventive screenings and vaccinations discussed, annual physical exam completed. Routine labs for  health maintenance will be ordered and will follow up to discuss results. PHM updated.    2. Type 2 diabetes mellitus with other specified complication, with long-term current use of insulin (HCC) Last A1c in August was normal. Urine sent for microalbumin.creatinine ratio test. Levemir insulin was discontinued, recently switched to toujeo insulin which she has picked up from pharmacy but has not had to start it yet. Continue metformin as prescribed.  - Urine Microalbumin w/creat. ratio  3. Hypertension associated with diabetes (HCC) Stable, continue furosemide, lisinopril-hydrochlorothiazide and metoprolol succinate as prescribed.   4. Hyperlipidemia associated with type 2 diabetes mellitus (HCC) Continue atorvastatin as prescribed.   5. Presbycusis  of both ears Referred to audiology at [facility] - Ambulatory referral to Audiology  6. Chronic right shoulder pain Continue cyclobenzaprine as needed as prescribed, refills ordered  - cyclobenzaprine (FLEXERIL) 10 MG tablet; Take 1 tablet (10 mg total) by mouth at bedtime. Take one tab po qhs for back spasm prn only  Dispense: 30 tablet; Refill: 3  7. PAD (peripheral artery disease) (HCC) Recently had lower extremity angiography done by her vascular surgeon. Has been having some pain in her right lower leg.   8. Anxiety and depression Continue lexapro as prescribed, refills ordered  - escitalopram (LEXAPRO) 10 MG tablet; Take 1 tablet (10 mg total) by mouth daily.  Dispense: 90 tablet; Refill: 1     General Counseling: Dreama verbalizes understanding of the findings of todays visit and agrees with plan of treatment. I have discussed any further diagnostic evaluation that may be needed or ordered today. We also reviewed her medications today. she has been encouraged to call the office with any questions or concerns that should arise related to todays visit.    Orders Placed This Encounter  Procedures   Urine Microalbumin w/creat. ratio    Ambulatory referral to Audiology    Meds ordered this encounter  Medications   cyclobenzaprine (FLEXERIL) 10 MG tablet    Sig: Take 1 tablet (10 mg total) by mouth at bedtime. Take one tab po qhs for back spasm prn only    Dispense:  30 tablet    Refill:  3   escitalopram (LEXAPRO) 10 MG tablet    Sig: Take 1 tablet (10 mg total) by mouth daily.    Dispense:  90 tablet    Refill:  1    Return in about 2 months (around 10/13/2023) for F/U, Labs, Angela Jensen PCP.   Total time spent:30 Minutes Time spent includes review of chart, medications, test results, and follow up plan with the patient.   [place] Controlled Substance Database was reviewed by me.  This patient was seen by Sallyanne Kuster, FNP-C in collaboration with Dr. Beverely Risen as a part of collaborative care agreement.  Angela Andrew R. Tedd Sias, MSN, FNP-C Internal medicine

## 2023-08-13 ENCOUNTER — Encounter: Payer: Self-pay | Admitting: Nurse Practitioner

## 2023-08-13 DIAGNOSIS — H9113 Presbycusis, bilateral: Secondary | ICD-10-CM | POA: Insufficient documentation

## 2023-08-13 LAB — MICROALBUMIN / CREATININE URINE RATIO
Creatinine, Urine: 144.3 mg/dL
Microalb/Creat Ratio: 11 mg/g{creat} (ref 0–29)
Microalbumin, Urine: 15.5 ug/mL

## 2023-08-16 ENCOUNTER — Telehealth (INDEPENDENT_AMBULATORY_CARE_PROVIDER_SITE_OTHER): Payer: Self-pay

## 2023-08-16 ENCOUNTER — Telehealth: Payer: Self-pay | Admitting: Nurse Practitioner

## 2023-08-16 NOTE — Telephone Encounter (Signed)
Patient left a message stating that she has a rash on the upper and inner left thigh for week. The rash is itchy and is peeling. The patient states the rash appeared after left le angio that was done 07/29/23. Please Advise

## 2023-08-16 NOTE — Telephone Encounter (Signed)
Audiology referral sent via [facility] to [facility]. Vivien Rota

## 2023-08-18 DIAGNOSIS — J449 Chronic obstructive pulmonary disease, unspecified: Secondary | ICD-10-CM | POA: Diagnosis not present

## 2023-08-18 DIAGNOSIS — J9610 Chronic respiratory failure, unspecified whether with hypoxia or hypercapnia: Secondary | ICD-10-CM | POA: Diagnosis not present

## 2023-08-19 ENCOUNTER — Other Ambulatory Visit: Payer: Self-pay | Admitting: Nurse Practitioner

## 2023-08-19 ENCOUNTER — Telehealth (INDEPENDENT_AMBULATORY_CARE_PROVIDER_SITE_OTHER): Payer: Self-pay

## 2023-08-19 DIAGNOSIS — Z760 Encounter for issue of repeat prescription: Secondary | ICD-10-CM

## 2023-08-19 NOTE — Telephone Encounter (Signed)
 She can stop and see if that Improves it

## 2023-08-19 NOTE — Telephone Encounter (Signed)
 Patient called stating she thinks the rash is coming from taking Lipitor.   Please advise  See previous phone encounter.

## 2023-08-19 NOTE — Telephone Encounter (Signed)
 I called patient to pass on the previous message by you, but I didn't receive an answer. Will try again later.

## 2023-08-19 NOTE — Telephone Encounter (Signed)
 She may have had a reaction to the contrast dye.  Try taking claritin 10 mg twice a day to see if that helps and starts to clear.  Do this for 7 days.  If it gets worse let us know.  She can also place hydrocortisone cream

## 2023-08-19 NOTE — Telephone Encounter (Signed)
 Patient notified with medical recommendations and verbalized understanding

## 2023-08-20 NOTE — Telephone Encounter (Signed)
 Straight to the voicemail that has not been setup yet. Called 3 times

## 2023-08-26 DIAGNOSIS — G4730 Sleep apnea, unspecified: Secondary | ICD-10-CM | POA: Diagnosis not present

## 2023-08-26 DIAGNOSIS — Z950 Presence of cardiac pacemaker: Secondary | ICD-10-CM | POA: Diagnosis not present

## 2023-08-26 DIAGNOSIS — Z794 Long term (current) use of insulin: Secondary | ICD-10-CM | POA: Diagnosis not present

## 2023-08-26 DIAGNOSIS — E782 Mixed hyperlipidemia: Secondary | ICD-10-CM | POA: Diagnosis not present

## 2023-08-26 DIAGNOSIS — I10 Essential (primary) hypertension: Secondary | ICD-10-CM | POA: Diagnosis not present

## 2023-08-26 DIAGNOSIS — E119 Type 2 diabetes mellitus without complications: Secondary | ICD-10-CM | POA: Diagnosis not present

## 2023-08-26 DIAGNOSIS — J4489 Other specified chronic obstructive pulmonary disease: Secondary | ICD-10-CM | POA: Diagnosis not present

## 2023-08-26 DIAGNOSIS — I442 Atrioventricular block, complete: Secondary | ICD-10-CM | POA: Diagnosis not present

## 2023-08-26 DIAGNOSIS — I739 Peripheral vascular disease, unspecified: Secondary | ICD-10-CM | POA: Diagnosis not present

## 2023-08-26 DIAGNOSIS — I700 Atherosclerosis of aorta: Secondary | ICD-10-CM | POA: Diagnosis not present

## 2023-08-26 DIAGNOSIS — I5032 Chronic diastolic (congestive) heart failure: Secondary | ICD-10-CM | POA: Diagnosis not present

## 2023-08-29 ENCOUNTER — Other Ambulatory Visit: Payer: Self-pay | Admitting: Internal Medicine

## 2023-08-29 DIAGNOSIS — F419 Anxiety disorder, unspecified: Secondary | ICD-10-CM

## 2023-08-30 ENCOUNTER — Other Ambulatory Visit (INDEPENDENT_AMBULATORY_CARE_PROVIDER_SITE_OTHER): Payer: Self-pay | Admitting: Vascular Surgery

## 2023-08-30 DIAGNOSIS — Z9889 Other specified postprocedural states: Secondary | ICD-10-CM

## 2023-08-30 NOTE — Telephone Encounter (Signed)
 Next 2/25

## 2023-08-31 DIAGNOSIS — H905 Unspecified sensorineural hearing loss: Secondary | ICD-10-CM | POA: Diagnosis not present

## 2023-09-01 ENCOUNTER — Encounter (INDEPENDENT_AMBULATORY_CARE_PROVIDER_SITE_OTHER): Payer: Self-pay | Admitting: Nurse Practitioner

## 2023-09-01 ENCOUNTER — Ambulatory Visit (INDEPENDENT_AMBULATORY_CARE_PROVIDER_SITE_OTHER): Payer: 59 | Admitting: Nurse Practitioner

## 2023-09-01 ENCOUNTER — Ambulatory Visit (INDEPENDENT_AMBULATORY_CARE_PROVIDER_SITE_OTHER): Payer: 59

## 2023-09-01 VITALS — BP 131/81 | HR 96 | Resp 16 | Wt 121.6 lb

## 2023-09-01 DIAGNOSIS — I10 Essential (primary) hypertension: Secondary | ICD-10-CM | POA: Diagnosis not present

## 2023-09-01 DIAGNOSIS — E0800 Diabetes mellitus due to underlying condition with hyperosmolarity without nonketotic hyperglycemic-hyperosmolar coma (NKHHC): Secondary | ICD-10-CM | POA: Diagnosis not present

## 2023-09-01 DIAGNOSIS — I70222 Atherosclerosis of native arteries of extremities with rest pain, left leg: Secondary | ICD-10-CM

## 2023-09-01 DIAGNOSIS — Z9889 Other specified postprocedural states: Secondary | ICD-10-CM

## 2023-09-01 DIAGNOSIS — I739 Peripheral vascular disease, unspecified: Secondary | ICD-10-CM | POA: Diagnosis not present

## 2023-09-01 DIAGNOSIS — Z794 Long term (current) use of insulin: Secondary | ICD-10-CM

## 2023-09-01 LAB — VAS US ABI WITH/WO TBI
Left ABI: 0.9
Right ABI: 0.74

## 2023-09-06 ENCOUNTER — Encounter (INDEPENDENT_AMBULATORY_CARE_PROVIDER_SITE_OTHER): Payer: Self-pay | Admitting: Nurse Practitioner

## 2023-09-06 DIAGNOSIS — R0602 Shortness of breath: Secondary | ICD-10-CM | POA: Diagnosis not present

## 2023-09-06 DIAGNOSIS — J449 Chronic obstructive pulmonary disease, unspecified: Secondary | ICD-10-CM | POA: Diagnosis not present

## 2023-09-06 DIAGNOSIS — I10 Essential (primary) hypertension: Secondary | ICD-10-CM | POA: Diagnosis not present

## 2023-09-06 NOTE — Progress Notes (Signed)
Subjective:    [name], female    DOB: May 28, 1956, 68 y.o.   MRN: [id number] Chief Complaint  Patient presents with   Follow-up    [facility] 4 week with ABI    The patient returns to the office for followup and review status post angiogram with intervention on 07/29/2023.   Procedure: Procedure(s) Performed:             1.  Ultrasound guidance for vascular access right femoral artery             2.  Catheter placement into left common femoral artery from right femoral approach             3.  Aortogram and selective left lower extremity angiogram             4.  Mechanical thrombectomy of the left SFA and popliteal arteries with the [place] Rex device             5.  Stent placement to the proximal left SFA with 6 mm diameter by 5 cm length Viabahn stent             6.  Stent placement to the left popliteal artery above the knee with 6 mm diameter by 5 cm length Viabahn stent             7.  StarClose closure device right femoral artery   The patient notes improvement in the lower extremity symptoms. No interval shortening of the patient's claudication distance or rest pain symptoms. No new ulcers or wounds have occurred since the last visit.  She notes having a rash post intervention but this has been improved with the use of Claritin and hydrocortisone cream  No documented history of amaurosis fugax or recent TIA symptoms. There are no recent neurological changes noted. No documented history of DVT, PE or superficial thrombophlebitis. The patient denies recent episodes of angina or shortness of breath.   ABI's Rt=0.74 and Lt=0.90  (previous ABI's Rt=0.89 and Lt=0.68) Duplex US of the right lower extremity shows biphasic waveforms and triphasic in the left.  There is dampened toe waveforms on the right with normal on the left    Review of Systems  Cardiovascular:  Negative for leg swelling.  Skin:  Positive for rash.  All other systems reviewed and are  negative.      Objective:   Physical Exam Vitals reviewed.  HENT:     Head: Normocephalic.  Cardiovascular:     Rate and Rhythm: Normal rate.     Pulses:          Dorsalis pedis pulses are detected w/ Doppler on the right side and detected w/ Doppler on the left side.       Posterior tibial pulses are detected w/ Doppler on the right side and detected w/ Doppler on the left side.  Pulmonary:     Effort: Pulmonary effort is normal.  Skin:    General: Skin is warm and dry.  Neurological:     Mental Status: She is alert and oriented to person, place, and time.  Psychiatric:        Mood and Affect: Mood normal.        Behavior: Behavior normal.        Thought Content: Thought content normal.        Judgment: Judgment normal.     BP 131/81   Pulse 96   Resp 16   Wt 121  lb 9.6 oz (55.2 kg)   BMI 24.56 kg/m   Past Medical History:  Diagnosis Date   Anemia    Arthritis    Asthma    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/2016   Cancer (HCC) 2012   Right Lung CA   COPD (chronic obstructive pulmonary disease) (HCC)    Depression    Diabetes mellitus without complication (HCC)    Patient takes Janumet   Essential hypertension 05/26/2016   Heart failure (HCC) 2022   Hydropneumothorax 05/03/2020   Hypercholesteremia    Oxygen dependent    2L at nite    PAD (peripheral artery disease) (HCC) 06/22/2016   Peripheral vascular disease (HCC)    Personal history of radiation therapy    Shortness of breath dyspnea    with exertion    Sialolithiasis    Sleep apnea    Wears dentures    full upper and lower    Social History   Socioeconomic History   Marital status: Widowed    Spouse name: Not on file   Number of children: Not on file   Years of education: Not on file   Highest education level: Not on file  Occupational History   Not on file  Tobacco Use   Smoking status: Former    Current packs/day: 0.00     Average packs/day: 1 pack/day for 37.0 years (37.0 ttl pk-yrs)    Types: Cigarettes    Start date: 02/06/1973    Quit date: 02/06/2010    Years since quitting: 13.5   Smokeless tobacco: Former    Types: Snuff  Vaping Use   Vaping status: Never Used  Substance and Sexual Activity   Alcohol use: Not Currently    Alcohol/week: 5.0 standard drinks of alcohol    Types: 5 Cans of beer per week    Comment: /h x of alcohol abuse -stopped 2012- now drinks 5 beer per week   Drug use: Not Currently    Types: Marijuana, "Crack" cocaine, Cocaine    Comment: hx of cocaine use- last use 2015; last use marijuana6/22/19,   Sexual activity: Yes  Other Topics Concern   Not on file  Social History Narrative   Lives with Significant Other x 43 years   Social Drivers of Corporate investment banker Strain: Not on file  Food Insecurity: No Food Insecurity (06/15/2023)   Hunger Vital Sign    Worried About Running Out of Food in the Last Year: Never true    Ran Out of Food in the Last Year: Never true  Transportation Needs: No Transportation Needs (06/15/2023)   PRAPARE - Administrator, Civil Service (Medical): No    Lack of Transportation (Non-Medical): No  Physical Activity: Not on file  Stress: Not on file  Social Connections: Not on file  Intimate Partner Violence: Not At Risk (06/15/2023)   Humiliation, Afraid, Rape, and Kick questionnaire    Fear of Current or Ex-Partner: No    Emotionally Abused: No    Physically Abused: No    Sexually Abused: No    Past Surgical History:  Procedure Laterality Date   CESAREAN SECTION     x3   COLONOSCOPY WITH PROPOFOL N/A 06/25/2015   Procedure: COLONOSCOPY WITH PROPOFOL;  Surgeon: Midge Minium, MD;  Location: [facility];  Service: Endoscopy;  Laterality: N/A;   COLONOSCOPY WITH PROPOFOL N/A 07/26/2020   Procedure: COLONOSCOPY WITH PROPOFOL;  Surgeon: Midge Minium, MD;  Location:  [facility];  Service: Endoscopy;  Laterality:  N/A;   CYST REMOVAL LEG     and on shoulder    ESOPHAGOGASTRODUODENOSCOPY (EGD) WITH PROPOFOL N/A 07/26/2020   Procedure: ESOPHAGOGASTRODUODENOSCOPY (EGD) WITH PROPOFOL;  Surgeon: Midge Minium, MD;  Location: [facility];  Service: Endoscopy;  Laterality: N/A;  Diabetic - oral meds   LOWER EXTREMITY ANGIOGRAPHY Left 09/29/2018   Procedure: LOWER EXTREMITY ANGIOGRAPHY;  Surgeon: Annice Needy, MD;  Location: [facility];  Service: Cardiovascular;  Laterality: Left;   LOWER EXTREMITY ANGIOGRAPHY Left 07/29/2023   Procedure: Lower Extremity Angiography;  Surgeon: Annice Needy, MD;  Location: [facility];  Service: Cardiovascular;  Laterality: Left;   LUNG BIOPSY  12/30/2011   has lung "spots"   PACEMAKER IMPLANT  07/14/2021   PACEMAKER LEADLESS INSERTION N/A 07/14/2021   Procedure: PACEMAKER LEADLESS INSERTION;  Surgeon: Marcina Millard, MD;  Location: [facility];  Service: Cardiovascular;  Laterality: N/A;   PERIPHERAL VASCULAR CATHETERIZATION Left 06/01/2016   Procedure: Lower Extremity Angiography;  Surgeon: Annice Needy, MD;  Location: [facility];  Service: Cardiovascular;  Laterality: Left;   PERIPHERAL VASCULAR CATHETERIZATION N/A 06/01/2016   Procedure: Abdominal Aortogram w/Lower Extremity;  Surgeon: Annice Needy, MD;  Location: [facility];  Service: Cardiovascular;  Laterality: N/A;   PERIPHERAL VASCULAR CATHETERIZATION  06/01/2016   Procedure: Lower Extremity Intervention;  Surgeon: Annice Needy, MD;  Location: [facility];  Service: Cardiovascular;;   PERIPHERAL VASCULAR CATHETERIZATION Right 06/08/2016   Procedure: Lower Extremity Angiography;  Surgeon: Annice Needy, MD;  Location: [facility];  Service: Cardiovascular;  Laterality: Right;   PERIPHERAL VASCULAR CATHETERIZATION  06/08/2016   Procedure: Lower Extremity Intervention;  Surgeon: Annice Needy, MD;  Location: [facility];  Service:  Cardiovascular;;   SUBMANDIBULAR GLAND EXCISION Left 12/06/2020   Procedure: EXCISION SUBMANDIBULAR GLAND;  Surgeon: Linus Salmons, MD;  Location: [facility];  Service: ENT;  Laterality: Left;  needs to be first case Diabetic - diet controlled   TEMPORARY PACEMAKER N/A 07/11/2021   Procedure: TEMPORARY PACEMAKER;  Surgeon: Marcina Millard, MD;  Location: [facility];  Service: Cardiovascular;  Laterality: N/A;    Family History  Problem Relation Age of Onset   Diabetes Mother    Hypercholesterolemia Mother    Lung cancer Father    Diabetes Sister    Diabetes Sister    Hypertension Sister    Diabetes Maternal Grandmother    Diabetes Paternal Grandmother    Heart attack Brother    Coronary artery disease Brother    Vascular Disease Brother    Heart attack Brother    Breast cancer Neg Hx     Allergies  Allergen Reactions   Trelegy Ellipta [Fluticasone-Umeclidin-Vilant]     Had breathing issues       Latest Ref Rng & Units 07/06/2023   10:17 AM 06/18/2023    4:11 AM 06/17/2023    5:49 AM  CBC  WBC 4.0 - 10.5 K/uL 4.8  6.6  6.2   Hemoglobin 12.0 - 15.0 g/dL 9.4  7.9  7.8   Hematocrit 36.0 - 46.0 % 31.9  25.7  25.5   Platelets 150 - 400 K/uL 282  296  320       CMP     Component Value Date/Time   NA 141 07/06/2023 1016   NA 137 03/28/2020 1037   NA 141 04/17/2014 1055  K 3.5 07/06/2023 1016   K 3.9 04/17/2014 1055   CL 99 07/06/2023 1016   CL 102 04/17/2014 1055   CO2 32 07/06/2023 1016   CO2 30 04/17/2014 1055   GLUCOSE 215 (H) 07/06/2023 1016   GLUCOSE 201 (H) 04/17/2014 1055   BUN 19 07/29/2023 1112   BUN 10 03/28/2020 1037   BUN 10 04/17/2014 1055   CREATININE 0.68 07/29/2023 1112   CREATININE 0.84 07/06/2023 1016   CREATININE 0.90 04/17/2014 1055   CALCIUM 9.0 07/06/2023 1016   CALCIUM 9.1 04/17/2014 1055   PROT 6.9 07/06/2023 1016   PROT 7.6 03/28/2020 1037   PROT 8.1 04/17/2014 1055   ALBUMIN 3.9 07/06/2023 1016    ALBUMIN 4.7 03/28/2020 1037   ALBUMIN 4.1 04/17/2014 1055   AST 24 07/06/2023 1016   ALT 27 07/06/2023 1016   ALT 29 04/17/2014 1055   ALKPHOS 78 07/06/2023 1016   ALKPHOS 73 04/17/2014 1055   BILITOT 0.4 07/06/2023 1016   GFRNONAA >60 07/29/2023 1112   GFRNONAA >60 07/06/2023 1016   GFRNONAA >60 04/17/2014 1055     VAS [name] ABI WITH/WO TBI Result Date: 09/01/2023  LOWER EXTREMITY DOPPLER STUDY Patient Name:  Angela Jensen  Date of Exam:   09/01/2023 Medical Rec #: [id number]     Accession #:    [id number] Date of Birth: 1955-08-24     Patient Gender: F Patient Age:   66 years Exam Location:  [facility] Procedure:      VAS [name] ABI WITH/WO TBI Referring Phys: Festus Barren --------------------------------------------------------------------------------  Indications: Claudication, and peripheral artery disease. Other Factors: Left SFA stents are known to be occluded.                Patient complains of constant numbness in left foot.                 07/29/2023: Aortogram and selective Left Lower                ExtremityAngiogram. Mechanical thrombectomy of the Left SFA and                Popliteal Artery with the [place] Rex device. Stent placement to the                Proximal Left SFA with 6 mm diameter by 5 cm length Viabahn                stent. Stent placement to the Left Popliteal Artery above the                knee with 6 mm diameter by 5 cm length Viabahn stent.  Vascular Interventions: 06/01/16: Left distal SFA/popliteal arteries PTAs with                         SFA stent x2;                         06/08/16: Left SFA PTA;                         09/29/18: Left SFA stent x2;. Comparison Study: 06/03/2023 Performing Technologist: Debbe Bales RVS  Examination Guidelines: A complete evaluation includes at minimum, Doppler waveform signals and systolic blood pressure reading at the level of bilateral brachial, anterior tibial, and posterior tibial arteries, when vessel segments are  accessible. Bilateral testing is  considered an integral part of a complete examination. Photoelectric Plethysmograph (PPG) waveforms and toe systolic pressure readings are included as required and additional duplex testing as needed. Limited examinations for reoccurring indications may be performed as noted.  ABI Findings: +---------+------------------+-----+--------+--------+ Right    Rt Pressure (mmHg)IndexWaveformComment  +---------+------------------+-----+--------+--------+ Brachial 128                                     +---------+------------------+-----+--------+--------+ ATA      85                0.62 biphasic         +---------+------------------+-----+--------+--------+ PTA      101               0.74 biphasic         +---------+------------------+-----+--------+--------+ Great Toe87                0.64 Abnormal         +---------+------------------+-----+--------+--------+ +---------+------------------+-----+---------+-------+ Left     Lt Pressure (mmHg)IndexWaveform Comment +---------+------------------+-----+---------+-------+ Brachial 136                                     +---------+------------------+-----+---------+-------+ ATA      120               0.88 triphasic        +---------+------------------+-----+---------+-------+ PTA      123               0.90 biphasic         +---------+------------------+-----+---------+-------+ Kerby Nora               0.86 Normal           +---------+------------------+-----+---------+-------+ +-------+-----------+-----------+------------+------------+ ABI/TBIToday's ABIToday's TBIPrevious ABIPrevious TBI +-------+-----------+-----------+------------+------------+ Right  .74        .64        .89         .79          +-------+-----------+-----------+------------+------------+ Left   .90        .86        .68         .37           +-------+-----------+-----------+------------+------------+ Right ABIs and TBIs appear decreased compared to prior study on 06/03/2023. Left ABIs and TBIs appear increased compared to prior study on 06/03/2023.  Summary: Right: Resting right ankle-brachial index indicates moderate right lower extremity arterial disease. The right toe-brachial index is abnormal. Left: Resting left ankle-brachial index indicates mild left lower extremity arterial disease. The left toe-brachial index is normal. *See table(s) above for measurements and observations.  Electronically signed by Festus Barren MD on 09/01/2023 at 4:26:10 PM.    Final        Assessment & Plan:   1. Atherosclerosis of native artery of left lower extremity with rest pain (HCC) (Primary) Recommend:  The patient is status post successful angiogram with intervention.  The patient reports that the claudication symptoms and leg pain has improved.   The patient denies lifestyle limiting changes at this point in time.  No further invasive studies, angiography or surgery at this time. The patient should continue walking and begin a more formal exercise program.  The patient should continue antiplatelet therapy and aggressive treatment of the lipid abnormalities  Continued surveillance is indicated as  atherosclerosis is likely to progress with time.    Patient should undergo noninvasive studies as ordered. The patient will follow up with me to review the studies.   2. Essential hypertension Continue antihypertensive medications as already ordered, these medications have been reviewed and there are no changes at this time.  3. Diabetes mellitus due to underlying condition with hyperosmolarity without coma, with long-term current use of insulin (HCC) Continue hypoglycemic medications as already ordered, these medications have been reviewed and there are no changes at this time.  Hgb A1C to be monitored as already arranged by primary  service   Current Outpatient Medications on File Prior to Visit  Medication Sig Dispense Refill   Accu-Chek Softclix Lancets lancets Use 1 lancet 3 times daily to check glucose for diabetes 300 each 1   albuterol (VENTOLIN HFA) 108 (90 Base) MCG/ACT inhaler INHALE 2 PUFFS BY MOUTH EVERY 6 HOURS AS NEEDED FOR WHEEZING OR SHORTNESS OF BREATH 9 g 1   ALPRAZolam (XANAX) 0.25 MG tablet Take 1 tablet (0.25 mg total) by mouth 2 (two) times daily as needed for anxiety. 60 tablet 0   aspirin EC 81 MG tablet Take 1 tablet (81 mg total) by mouth daily. Swallow whole. 150 tablet 2   atorvastatin (LIPITOR) 10 MG tablet Take 1 tablet (10 mg total) by mouth daily. 30 tablet 11   Blood Glucose Monitoring Suppl (ACCU-CHEK GUIDE) w/Device KIT Use as directed Dx e11.65 1 kit 0   calcium-vitamin D (OSCAL WITH D) 500-5 MG-MCG tablet Take 1 tablet by mouth 2 (two) times daily. 100 tablet 0   clopidogrel (PLAVIX) 75 MG tablet Take 1 tablet (75 mg total) by mouth daily. 90 tablet 3   Continuous Blood Gluc Receiver (DEXCOM G7 RECEIVER) DEVI Use one as directed for uncontrolled dm 1 each 0   Continuous Blood Gluc Sensor (DEXCOM G7 SENSOR) MISC Apply 1 sensor to skin on designated areas every 10 days to monitor glucose levels with sensor or smart phone app 3 each 11   cyclobenzaprine (FLEXERIL) 10 MG tablet Take 1 tablet (10 mg total) by mouth at bedtime. Take one tab po qhs for back spasm prn only 30 tablet 3   Ensifentrine (OHTUVAYRE) 3 MG/2.5ML SUSP Inhale 3 mg into the lungs in the morning and at bedtime. 150 mL 11   escitalopram (LEXAPRO) 10 MG tablet Take 1 tablet (10 mg total) by mouth daily. 90 tablet 1   ferrous sulfate 325 (65 FE) MG tablet Take 325 mg by mouth 2 (two) times daily with a meal.     furosemide (LASIX) 20 MG tablet Take 1 tablet (20 mg total) by mouth daily as needed. 30 tablet 5   gabapentin (NEURONTIN) 300 MG capsule TAKE 1 CAPSULE BY MOUTH THREE TIMES DAILY 90 capsule 0   glucose blood  (ACCU-CHEK GUIDE) test strip USE TO TEST BLOOD SUGARS 5 TIMES DAILY 450 each 0   HYDROcodone-acetaminophen (NORCO/VICODIN) 5-325 MG tablet Take 1 tablet by mouth every 4 (four) hours as needed for moderate pain. 30 tablet 0   insulin glargine, 1 Unit Dial, (TOUJEO) 300 UNIT/ML Solostar Pen Inject 10 Units into the skin daily. 1.5 mL 11   Insulin Pen Needle (RELION PEN NEEDLES) 31G X 6 MM MISC USE 1 PEN NEEDLE WITH INSULIN PEN ONCE DAILY FOR DIABETES 50 each 3   ipratropium-albuterol (DUONEB) 0.5-2.5 (3) MG/3ML SOLN Take 3 mLs by nebulization every 4 (four) hours as needed. 360 mL 3   lisinopril-hydrochlorothiazide (ZESTORETIC) 20-25 MG  tablet Take 1 tablet by mouth daily.     metFORMIN (GLUCOPHAGE) 500 MG tablet TAKE 2 TABLETS BY MOUTH ONCE DAILY WITH BREAKFAST 180 tablet 1   metoprolol succinate (TOPROL-XL) 25 MG 24 hr tablet Take 1 tablet (25 mg total) by mouth daily. 30 tablet 3   mometasone-formoterol (DULERA) 200-5 MCG/ACT AERO Inhale 2 puffs by mouth twice daily 13 g 12   Omega-3 Fatty Acids (FISH OIL) 1000 MG CAPS Take 1 capsule by mouth daily.     OXYGEN Inhale 4 L into the lungs. PT USES [employer] FOR OXYGEN     potassium chloride (KLOR-CON) 10 MEQ tablet Take 1 tablet (10 mEq total) by mouth every other day. 45 tablet 2   SPIRIVA HANDIHALER 18 MCG inhalation capsule INHALE THE CONTENTS OF 1 CAPSULES BY MOUTH ONCE DAILY - DO NOT SWALLOW CAPSULES 90 capsule 3   vitamin B-12 (CYANOCOBALAMIN) 100 MCG tablet Take 100 mcg by mouth daily.     VITAMIN D, CHOLECALCIFEROL, PO Take 1 tablet by mouth daily with supper.     famotidine (PEPCID) 20 MG tablet Take 1 tablet (20 mg total) by mouth daily for 14 days. (Patient taking differently: Take 20 mg by mouth daily as needed for heartburn.) 14 tablet 0   No current facility-administered medications on file prior to visit.    There are no Patient Instructions on file for this visit. No follow-ups on file.   Georgiana Spinner, NP

## 2023-09-17 ENCOUNTER — Other Ambulatory Visit: Payer: Self-pay | Admitting: Nurse Practitioner

## 2023-09-17 DIAGNOSIS — Z1231 Encounter for screening mammogram for malignant neoplasm of breast: Secondary | ICD-10-CM

## 2023-09-21 ENCOUNTER — Other Ambulatory Visit: Payer: Self-pay | Admitting: Nurse Practitioner

## 2023-09-21 DIAGNOSIS — Z760 Encounter for issue of repeat prescription: Secondary | ICD-10-CM

## 2023-09-21 DIAGNOSIS — E1165 Type 2 diabetes mellitus with hyperglycemia: Secondary | ICD-10-CM

## 2023-09-25 ENCOUNTER — Other Ambulatory Visit: Payer: Self-pay | Admitting: Nurse Practitioner

## 2023-09-25 DIAGNOSIS — F419 Anxiety disorder, unspecified: Secondary | ICD-10-CM

## 2023-09-27 NOTE — Telephone Encounter (Signed)
 Please review and send next 10/13/23

## 2023-09-29 ENCOUNTER — Ambulatory Visit
Admission: RE | Admit: 2023-09-29 | Discharge: 2023-09-29 | Disposition: A | Discharge status: Home / Self Care | Payer: 59 | Source: Ambulatory Visit | Attending: Radiation Oncology | Admitting: Radiation Oncology

## 2023-09-29 DIAGNOSIS — Z85118 Personal history of other malignant neoplasm of bronchus and lung: Secondary | ICD-10-CM | POA: Diagnosis not present

## 2023-09-29 DIAGNOSIS — J439 Emphysema, unspecified: Secondary | ICD-10-CM | POA: Diagnosis not present

## 2023-09-29 DIAGNOSIS — J90 Pleural effusion, not elsewhere classified: Secondary | ICD-10-CM | POA: Diagnosis not present

## 2023-09-29 DIAGNOSIS — C3411 Malignant neoplasm of upper lobe, right bronchus or lung: Secondary | ICD-10-CM | POA: Diagnosis not present

## 2023-09-29 LAB — POCT I-STAT CREATININE: Creatinine, Ser: 0.6 mg/dL (ref 0.44–1.00)

## 2023-09-29 MED ORDER — IOHEXOL 300 MG/ML  SOLN
75.0000 mL | Freq: Once | INTRAMUSCULAR | Status: AC | PRN
  Administered 2023-09-29: 75 mL via INTRAVENOUS

## 2023-09-30 DIAGNOSIS — J449 Chronic obstructive pulmonary disease, unspecified: Secondary | ICD-10-CM | POA: Diagnosis not present

## 2023-09-30 DIAGNOSIS — J9610 Chronic respiratory failure, unspecified whether with hypoxia or hypercapnia: Secondary | ICD-10-CM | POA: Diagnosis not present

## 2023-10-04 ENCOUNTER — Other Ambulatory Visit: Payer: Self-pay | Admitting: Nurse Practitioner

## 2023-10-04 ENCOUNTER — Encounter: Payer: Self-pay | Admitting: Radiation Oncology

## 2023-10-04 ENCOUNTER — Ambulatory Visit
Admission: RE | Admit: 2023-10-04 | Discharge: 2023-10-04 | Disposition: A | Discharge status: Home / Self Care | Payer: 59 | Source: Ambulatory Visit | Attending: Radiation Oncology | Admitting: Radiation Oncology

## 2023-10-04 VITALS — BP 131/84 | HR 92 | Temp 98.2°F | Resp 20 | Wt 127.0 lb

## 2023-10-04 DIAGNOSIS — I10 Essential (primary) hypertension: Secondary | ICD-10-CM | POA: Diagnosis not present

## 2023-10-04 DIAGNOSIS — R918 Other nonspecific abnormal finding of lung field: Secondary | ICD-10-CM | POA: Diagnosis not present

## 2023-10-04 DIAGNOSIS — C3411 Malignant neoplasm of upper lobe, right bronchus or lung: Secondary | ICD-10-CM | POA: Insufficient documentation

## 2023-10-04 DIAGNOSIS — E1165 Type 2 diabetes mellitus with hyperglycemia: Secondary | ICD-10-CM | POA: Diagnosis not present

## 2023-10-04 DIAGNOSIS — R042 Hemoptysis: Secondary | ICD-10-CM | POA: Insufficient documentation

## 2023-10-04 DIAGNOSIS — I739 Peripheral vascular disease, unspecified: Secondary | ICD-10-CM | POA: Diagnosis not present

## 2023-10-04 DIAGNOSIS — R0789 Other chest pain: Secondary | ICD-10-CM | POA: Diagnosis not present

## 2023-10-04 DIAGNOSIS — E559 Vitamin D deficiency, unspecified: Secondary | ICD-10-CM | POA: Diagnosis not present

## 2023-10-04 DIAGNOSIS — C3490 Malignant neoplasm of unspecified part of unspecified bronchus or lung: Secondary | ICD-10-CM

## 2023-10-04 DIAGNOSIS — D472 Monoclonal gammopathy: Secondary | ICD-10-CM | POA: Insufficient documentation

## 2023-10-04 DIAGNOSIS — C3431 Malignant neoplasm of lower lobe, right bronchus or lung: Secondary | ICD-10-CM | POA: Diagnosis not present

## 2023-10-04 DIAGNOSIS — Z87891 Personal history of nicotine dependence: Secondary | ICD-10-CM | POA: Diagnosis not present

## 2023-10-04 DIAGNOSIS — R059 Cough, unspecified: Secondary | ICD-10-CM | POA: Diagnosis not present

## 2023-10-04 DIAGNOSIS — E782 Mixed hyperlipidemia: Secondary | ICD-10-CM | POA: Diagnosis not present

## 2023-10-04 NOTE — Progress Notes (Signed)
Radiation Oncology Follow up Note  Name: Angela Jensen   Date:   10/04/2023 MRN:  [id number] DOB: May 17, 1956    This 68 y.o. 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  REFERRING PROVIDER: Sallyanne Kuster, NP  HPI: Patient is a 68 year old female now out 3 years having completed her last SBRT to her right lower lobe in a patient who previously treated with SBRT to her right upper lobe for adenocarcinoma.  Seen today in routine follow-up she is doing well her pulmonary status is stable she is on continuous nasal oxygen.  Specifically [place] cough hemoptysis chest tightness.  Her to her most recent CT scan.  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  She also has bilateral pulm nodules which are unchanged since 24.  COMPLICATIONS OF TREATMENT: none  FOLLOW UP COMPLIANCE: keeps appointments   PHYSICAL EXAM:  BP 131/84   Pulse 92   Temp 98.2 F (36.8 C) (Temporal)   Resp 20   Wt 127 lb (57.6 kg)   BMI 25.65 kg/m  Patient is on continuous nasal oxygen.  Well-developed well-nourished patient in NAD. HEENT reveals PERLA, EOMI, discs not visualized.  Oral cavity is clear. No oral mucosal lesions are identified. Neck is clear without evidence of cervical or supraclavicular adenopathy. Lungs are clear to A&P. Cardiac examination is essentially unremarkable with regular rate and rhythm without murmur rub or thrill. Abdomen is benign with no organomegaly or masses noted. Motor sensory and DTR levels are equal and symmetric in the upper and lower extremities. Cranial nerves II through XII are grossly intact. Proprioception is intact. No peripheral adenopathy or edema is identified. No motor or sensory levels are noted. Crude visual fields are within normal range.  RADIOLOGY  RESULTS: CT scans reviewed compatible with above-stated findings  PLAN: Present time patient is doing well with no evidence of progressive disease now out over 3 years.  I am pleased with her overall progress.  I will turn follow-up care over to medical oncology as well as being followed for MGUS.  I be happy to reevaluate this patient in time the future should that be indicated.  Patient knows to call with any concerns.  I would like to take this opportunity to thank you for allowing me to participate in the care of your patient.Carmina Miller, MD

## 2023-10-05 LAB — LIPID PANEL
Chol/HDL Ratio: 2.7 {ratio} (ref 0.0–4.4)
Cholesterol, Total: 143 mg/dL (ref 100–199)
HDL: 53 mg/dL (ref >39)
LDL Chol Calc (NIH): 75 mg/dL (ref 0–99)
Triglycerides: 74 mg/dL (ref 0–149)
VLDL Cholesterol Cal: 15 mg/dL (ref 5–40)

## 2023-10-05 LAB — COMPREHENSIVE METABOLIC PANEL
ALT: 19 [IU]/L (ref 0–32)
AST: 22 [IU]/L (ref 0–40)
Albumin: 4.4 g/dL (ref 3.9–4.9)
Alkaline Phosphatase: 96 [IU]/L (ref 44–121)
BUN/Creatinine Ratio: 19 (ref 12–28)
BUN: 12 mg/dL (ref 8–27)
Bilirubin Total: 0.2 mg/dL (ref 0.0–1.2)
CO2: 27 mmol/L (ref 20–29)
Calcium: 9.4 mg/dL (ref 8.7–10.3)
Chloride: 99 mmol/L (ref 96–106)
Creatinine, Ser: 0.63 mg/dL (ref 0.57–1.00)
Globulin, Total: 2.5 g/dL (ref 1.5–4.5)
Glucose: 173 mg/dL — ABNORMAL HIGH (ref 70–99)
Potassium: 4.2 mmol/L (ref 3.5–5.2)
Sodium: 144 mmol/L (ref 134–144)
Total Protein: 6.9 g/dL (ref 6.0–8.5)
eGFR: 97 mL/min/{1.73_m2} (ref >59)

## 2023-10-05 LAB — CBC WITH DIFFERENTIAL/PLATELET
Basophils Absolute: 0 10*3/uL (ref 0.0–0.2)
Basos: 1 %
EOS (ABSOLUTE): 0.1 10*3/uL (ref 0.0–0.4)
Eos: 1 %
Hematocrit: 37.6 % (ref 34.0–46.6)
Hemoglobin: 11.6 g/dL (ref 11.1–15.9)
Immature Grans (Abs): 0 10*3/uL (ref 0.0–0.1)
Immature Granulocytes: 0 %
Lymphocytes Absolute: 0.9 10*3/uL (ref 0.7–3.1)
Lymphs: 18 %
MCH: 27 pg (ref 26.6–33.0)
MCHC: 30.9 g/dL — ABNORMAL LOW (ref 31.5–35.7)
MCV: 88 fL (ref 79–97)
Monocytes Absolute: 0.4 10*3/uL (ref 0.1–0.9)
Monocytes: 10 %
Neutrophils Absolute: 3.3 10*3/uL (ref 1.4–7.0)
Neutrophils: 70 %
Platelets: 236 10*3/uL (ref 150–450)
RBC: 4.29 x10E6/uL (ref 3.77–5.28)
RDW: 12.9 % (ref 11.7–15.4)
WBC: 4.7 10*3/uL (ref 3.4–10.8)

## 2023-10-05 LAB — HGB A1C W/O EAG: Hgb A1c MFr Bld: 8.9 % — ABNORMAL HIGH (ref 4.8–5.6)

## 2023-10-05 LAB — VITAMIN D 25 HYDROXY (VIT D DEFICIENCY, FRACTURES): Vit D, 25-Hydroxy: 69.4 ng/mL (ref 30.0–100.0)

## 2023-10-06 ENCOUNTER — Inpatient Hospital Stay: Payer: 59

## 2023-10-07 ENCOUNTER — Other Ambulatory Visit: Payer: Self-pay | Admitting: Internal Medicine

## 2023-10-07 DIAGNOSIS — R0602 Shortness of breath: Secondary | ICD-10-CM | POA: Diagnosis not present

## 2023-10-07 DIAGNOSIS — F419 Anxiety disorder, unspecified: Secondary | ICD-10-CM

## 2023-10-07 DIAGNOSIS — J449 Chronic obstructive pulmonary disease, unspecified: Secondary | ICD-10-CM | POA: Diagnosis not present

## 2023-10-07 DIAGNOSIS — I10 Essential (primary) hypertension: Secondary | ICD-10-CM | POA: Diagnosis not present

## 2023-10-08 ENCOUNTER — Inpatient Hospital Stay: Payer: 59 | Attending: Oncology

## 2023-10-08 ENCOUNTER — Ambulatory Visit
Admission: RE | Admit: 2023-10-08 | Discharge: 2023-10-08 | Disposition: A | Discharge status: Home / Self Care | Payer: 59 | Source: Ambulatory Visit | Attending: Nurse Practitioner | Admitting: Nurse Practitioner

## 2023-10-08 DIAGNOSIS — D509 Iron deficiency anemia, unspecified: Secondary | ICD-10-CM | POA: Insufficient documentation

## 2023-10-08 DIAGNOSIS — J9611 Chronic respiratory failure with hypoxia: Secondary | ICD-10-CM | POA: Insufficient documentation

## 2023-10-08 DIAGNOSIS — Z923 Personal history of irradiation: Secondary | ICD-10-CM | POA: Insufficient documentation

## 2023-10-08 DIAGNOSIS — D472 Monoclonal gammopathy: Secondary | ICD-10-CM | POA: Insufficient documentation

## 2023-10-08 DIAGNOSIS — C3431 Malignant neoplasm of lower lobe, right bronchus or lung: Secondary | ICD-10-CM | POA: Diagnosis not present

## 2023-10-08 DIAGNOSIS — R970 Elevated carcinoembryonic antigen [CEA]: Secondary | ICD-10-CM | POA: Diagnosis not present

## 2023-10-08 DIAGNOSIS — Z87891 Personal history of nicotine dependence: Secondary | ICD-10-CM | POA: Diagnosis not present

## 2023-10-08 DIAGNOSIS — Z1231 Encounter for screening mammogram for malignant neoplasm of breast: Secondary | ICD-10-CM | POA: Diagnosis not present

## 2023-10-08 DIAGNOSIS — D649 Anemia, unspecified: Secondary | ICD-10-CM

## 2023-10-08 DIAGNOSIS — C3491 Malignant neoplasm of unspecified part of right bronchus or lung: Secondary | ICD-10-CM

## 2023-10-08 LAB — CBC WITH DIFFERENTIAL (CANCER CENTER ONLY)
Abs Immature Granulocytes: 0.05 10*3/uL (ref 0.00–0.07)
Basophils Absolute: 0 10*3/uL (ref 0.0–0.1)
Basophils Relative: 1 %
Eosinophils Absolute: 0.1 10*3/uL (ref 0.0–0.5)
Eosinophils Relative: 2 %
HCT: 42.5 % (ref 36.0–46.0)
Hemoglobin: 12.7 g/dL (ref 12.0–15.0)
Immature Granulocytes: 1 %
Lymphocytes Relative: 18 %
Lymphs Abs: 1 10*3/uL (ref 0.7–4.0)
MCH: 27.3 pg (ref 26.0–34.0)
MCHC: 29.9 g/dL — ABNORMAL LOW (ref 30.0–36.0)
MCV: 91.2 fL (ref 80.0–100.0)
Monocytes Absolute: 0.5 10*3/uL (ref 0.1–1.0)
Monocytes Relative: 9 %
Neutro Abs: 3.8 10*3/uL (ref 1.7–7.7)
Neutrophils Relative %: 69 %
Platelet Count: 264 10*3/uL (ref 150–400)
RBC: 4.66 MIL/uL (ref 3.87–5.11)
RDW: 14.9 % (ref 11.5–15.5)
WBC Count: 5.4 10*3/uL (ref 4.0–10.5)
nRBC: 0 % (ref 0.0–0.2)

## 2023-10-08 LAB — FERRITIN: Ferritin: 74 ng/mL (ref 11–307)

## 2023-10-08 LAB — RETIC PANEL
Immature Retic Fract: 9.8 % (ref 2.3–15.9)
RBC.: 4.63 MIL/uL (ref 3.87–5.11)
Retic Count, Absolute: 36.6 10*3/uL (ref 19.0–186.0)
Retic Ct Pct: 0.8 % (ref 0.4–3.1)
Reticulocyte Hemoglobin: 29.5 pg (ref >27.9)

## 2023-10-08 LAB — IRON AND TIBC
Iron: 79 ug/dL (ref 28–170)
Saturation Ratios: 19 % (ref 10.4–31.8)
TIBC: 417 ug/dL (ref 250–450)
UIBC: 338 ug/dL

## 2023-10-08 MED ORDER — ROCURONIUM BROMIDE 10 MG/ML (PF) SYRINGE
PREFILLED_SYRINGE | INTRAVENOUS | Status: AC
Start: 2023-10-08 — End: ?
  Filled 2023-10-08: qty 10

## 2023-10-08 MED ORDER — MIDAZOLAM HCL 2 MG/2ML IJ SOLN
INTRAMUSCULAR | Status: AC
  Filled 2023-10-08: qty 2

## 2023-10-08 MED ORDER — LIDOCAINE HCL (PF) 2 % IJ SOLN
INTRAMUSCULAR | Status: AC
  Filled 2023-10-08: qty 5

## 2023-10-08 MED ORDER — SUGAMMADEX SODIUM 200 MG/2ML IV SOLN
INTRAVENOUS | Status: AC
  Filled 2023-10-08: qty 4

## 2023-10-08 MED ORDER — KETAMINE HCL 50 MG/5ML IJ SOSY
PREFILLED_SYRINGE | INTRAMUSCULAR | Status: AC
  Filled 2023-10-08: qty 5

## 2023-10-08 MED ORDER — FENTANYL CITRATE (PF) 100 MCG/2ML IJ SOLN
INTRAMUSCULAR | Status: AC
  Filled 2023-10-08: qty 2

## 2023-10-08 MED ORDER — PHENYLEPHRINE HCL-NACL 20-0.9 MG/250ML-% IV SOLN
INTRAVENOUS | Status: AC
  Filled 2023-10-08: qty 250

## 2023-10-08 NOTE — Telephone Encounter (Signed)
 Next appt 10/13/23

## 2023-10-11 LAB — MULTIPLE MYELOMA PANEL, SERUM
Albumin SerPl Elph-Mcnc: 4 g/dL (ref 2.9–4.4)
Albumin/Glob SerPl: 1.2 (ref 0.7–1.7)
Alpha 1: 0.3 g/dL (ref 0.0–0.4)
Alpha2 Glob SerPl Elph-Mcnc: 1 g/dL (ref 0.4–1.0)
B-Globulin SerPl Elph-Mcnc: 1.2 g/dL (ref 0.7–1.3)
Gamma Glob SerPl Elph-Mcnc: 1.1 g/dL (ref 0.4–1.8)
Globulin, Total: 3.5 g/dL (ref 2.2–3.9)
IgA: 160 mg/dL (ref 87–352)
IgG (Immunoglobin G), Serum: 1121 mg/dL (ref 586–1602)
IgM (Immunoglobulin M), Srm: 131 mg/dL (ref 26–217)
M Protein SerPl Elph-Mcnc: 0.4 g/dL — ABNORMAL HIGH
Total Protein ELP: 7.5 g/dL (ref 6.0–8.5)

## 2023-10-11 LAB — KAPPA/LAMBDA LIGHT CHAINS
Kappa free light chain: 17.9 mg/L (ref 3.3–19.4)
Kappa, lambda light chain ratio: 0.97 (ref 0.26–1.65)
Lambda free light chains: 18.4 mg/L (ref 5.7–26.3)

## 2023-10-13 ENCOUNTER — Ambulatory Visit (INDEPENDENT_AMBULATORY_CARE_PROVIDER_SITE_OTHER): Payer: 59 | Admitting: Nurse Practitioner

## 2023-10-13 ENCOUNTER — Inpatient Hospital Stay (HOSPITAL_BASED_OUTPATIENT_CLINIC_OR_DEPARTMENT_OTHER): Payer: 59 | Admitting: Oncology

## 2023-10-13 ENCOUNTER — Encounter: Payer: Self-pay | Admitting: Nurse Practitioner

## 2023-10-13 ENCOUNTER — Encounter: Payer: Self-pay | Admitting: Oncology

## 2023-10-13 ENCOUNTER — Inpatient Hospital Stay: Payer: 59

## 2023-10-13 ENCOUNTER — Other Ambulatory Visit: Payer: 59

## 2023-10-13 ENCOUNTER — Ambulatory Visit: Payer: 59 | Admitting: Nurse Practitioner

## 2023-10-13 ENCOUNTER — Ambulatory Visit: Payer: 59 | Admitting: Oncology

## 2023-10-13 ENCOUNTER — Telehealth: Payer: Self-pay

## 2023-10-13 VITALS — BP 138/70 | HR 98 | Temp 97.8°F | Resp 16 | Ht 59.0 in | Wt 124.0 lb

## 2023-10-13 VITALS — BP 102/76 | HR 105 | Temp 98.4°F | Resp 18 | Wt 122.4 lb

## 2023-10-13 DIAGNOSIS — J9611 Chronic respiratory failure with hypoxia: Secondary | ICD-10-CM | POA: Diagnosis not present

## 2023-10-13 DIAGNOSIS — D508 Other iron deficiency anemias: Secondary | ICD-10-CM

## 2023-10-13 DIAGNOSIS — M25511 Pain in right shoulder: Secondary | ICD-10-CM

## 2023-10-13 DIAGNOSIS — J449 Chronic obstructive pulmonary disease, unspecified: Secondary | ICD-10-CM | POA: Diagnosis not present

## 2023-10-13 DIAGNOSIS — Z79899 Other long term (current) drug therapy: Secondary | ICD-10-CM | POA: Diagnosis not present

## 2023-10-13 DIAGNOSIS — I152 Hypertension secondary to endocrine disorders: Secondary | ICD-10-CM

## 2023-10-13 DIAGNOSIS — D472 Monoclonal gammopathy: Secondary | ICD-10-CM | POA: Diagnosis not present

## 2023-10-13 DIAGNOSIS — F419 Anxiety disorder, unspecified: Secondary | ICD-10-CM

## 2023-10-13 DIAGNOSIS — E1159 Type 2 diabetes mellitus with other circulatory complications: Secondary | ICD-10-CM

## 2023-10-13 DIAGNOSIS — C3491 Malignant neoplasm of unspecified part of right bronchus or lung: Secondary | ICD-10-CM

## 2023-10-13 DIAGNOSIS — Z87891 Personal history of nicotine dependence: Secondary | ICD-10-CM | POA: Diagnosis not present

## 2023-10-13 DIAGNOSIS — Z85118 Personal history of other malignant neoplasm of bronchus and lung: Secondary | ICD-10-CM

## 2023-10-13 DIAGNOSIS — Z794 Long term (current) use of insulin: Secondary | ICD-10-CM

## 2023-10-13 DIAGNOSIS — D509 Iron deficiency anemia, unspecified: Secondary | ICD-10-CM | POA: Diagnosis not present

## 2023-10-13 DIAGNOSIS — E1169 Type 2 diabetes mellitus with other specified complication: Secondary | ICD-10-CM

## 2023-10-13 DIAGNOSIS — Z923 Personal history of irradiation: Secondary | ICD-10-CM | POA: Diagnosis not present

## 2023-10-13 DIAGNOSIS — R970 Elevated carcinoembryonic antigen [CEA]: Secondary | ICD-10-CM | POA: Diagnosis not present

## 2023-10-13 DIAGNOSIS — C3431 Malignant neoplasm of lower lobe, right bronchus or lung: Secondary | ICD-10-CM | POA: Diagnosis not present

## 2023-10-13 DIAGNOSIS — G8929 Other chronic pain: Secondary | ICD-10-CM | POA: Diagnosis not present

## 2023-10-13 MED ORDER — ALPRAZOLAM 0.25 MG PO TABS: 0.2500 mg | ORAL_TABLET | Freq: Two times a day (BID) | ORAL | 2 refills | Status: DC | PRN

## 2023-10-13 MED ORDER — CYCLOBENZAPRINE HCL 10 MG PO TABS: 10.0000 mg | ORAL_TABLET | Freq: Every day | ORAL | 3 refills | Status: DC

## 2023-10-13 MED ORDER — METFORMIN HCL ER 500 MG PO TB24: 1000.0000 mg | ORAL_TABLET | Freq: Every day | ORAL | 1 refills | Status: DC

## 2023-10-13 MED ORDER — ALBUTEROL SULFATE HFA 108 (90 BASE) MCG/ACT IN AERS
INHALATION_SPRAY | RESPIRATORY_TRACT | 5 refills | Status: AC
Start: 2023-10-13 — End: ?

## 2023-10-13 MED ORDER — METOPROLOL SUCCINATE ER 25 MG PO TB24: 25.0000 mg | ORAL_TABLET | Freq: Every day | ORAL | 3 refills | Status: DC

## 2023-10-13 MED ORDER — CLOPIDOGREL BISULFATE 75 MG PO TABS: 75.0000 mg | ORAL_TABLET | Freq: Every day | ORAL | 3 refills | Status: DC

## 2023-10-13 MED ORDER — LISINOPRIL-HYDROCHLOROTHIAZIDE 20-25 MG PO TABS: 1.0000 | ORAL_TABLET | Freq: Every day | ORAL | 3 refills | Status: DC

## 2023-10-13 NOTE — Assessment & Plan Note (Addendum)
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 -Recurrence-SBRT completed 06/11/2020.NGS showed TMB >10 muts/mb, MS stable, KRAS G12V, MYC amplification,STK11 S26fs*47, FUBP1 splice sit 473+1G>T Feb 2025  CT scan results reviewed and discussed with patient, radiation fibrosis and stable lung nodules.  No lymphadenopathy She has been discharged from radiation oncology Given her extensive history of smoking and history of lung cancer, I recommend to continue annual CT without contrast for screening

## 2023-10-13 NOTE — Progress Notes (Signed)
 Hematology/Oncology Progress note [phone] [phone]     Chief Complaint: Angela Jensen is a 68 y.o. 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 -Recurrence-SBRT completed 06/11/2020.NGS showed TMB >10 muts/mb, MS stable, KRAS G12V, MYC amplification,STK11 S219fs*47, FUBP1 splice sit 473+1G>T Feb 2025  CT scan results reviewed and discussed with patient, radiation fibrosis and stable lung nodules.  No lymphadenopathy She has been discharged from radiation oncology Given her extensive history of smoking and history of lung cancer, I recommend to continue annual CT without contrast for screening   IDA (iron deficiency anemia) Lab Results  Component Value Date   HGB 12.7 10/08/2023   TIBC 417 10/08/2023   IRONPCTSAT 19 10/08/2023   FERRITIN 74 10/08/2023    Both hemoglobin and iron panel have normalized.  Hold off IV Venofer  MGUS (monoclonal gammopathy of unknown significance) IgG kappa MGUS, baseline SPEP M protien 0.2.  Lab Results  Component Value Date   MPROTEIN 0.4 (H) 10/08/2023   KPAFRELGTCHN 17.9 10/08/2023   LAMBDASER 18.4 10/08/2023   KAPLAMBRATIO 0.97 10/08/2023  Slight increase in M protein to 0.4. I recommend observation. Check SPEP and light chain ratio every 6 months.    Chronic respiratory failure with hypoxia (HCC) Stable.  Continue oxygen supplementation.  Orders Placed This Encounter  Procedures   CBC with Differential (Cancer Center Only)    Standing Status:   Future    Expected Date:   04/11/2024    Expiration Date:   10/12/2024   CMP (Cancer Center only)    Standing Status:   Future    Expected Date:   04/11/2024    Expiration Date:   10/12/2024   Multiple Myeloma Panel  (SPEP&IFE w/QIG)    Standing Status:   Future    Expected Date:   04/11/2024    Expiration Date:   10/12/2024   Kappa/lambda light chains    Standing Status:   Future    Expected Date:   04/11/2024    Expiration Date:   10/12/2024   Iron and TIBC    Standing Status:   Future    Expected Date:   04/11/2024    Expiration Date:   10/12/2024   Ferritin    Standing Status:   Future    Expected Date:   04/11/2024    Expiration Date:   10/12/2024   Follow-up in 6 months All questions were answered. The patient knows to call the clinic with any problems, questions or concerns.  Rickard Patience, MD, PhD [facility] Hematology Oncology 10/13/2023    PERTINENT ONCOLOGY HISTORY Patient previously followed up by Dr.Corcoran, patient switched care to me on 03/20/21 Extensive medical record review was performed by me  # Stage I RUL adenocarcinoma  s/p IMRT in 02/2012.   She was not a good surgery candidate secondary to her lung function (FEV1 28%).  CT guided biopsy of the superior and inferior RUL nodules on 12/30/2011 revealed well differentiated adenocarcinoma of lung primary.  EGFR testing revealed no mutation.  She received IMRT 6000 cGy to both nodules from 01/26/2012 - 03/10/2012.   In 04/2015, there was an attempt at radiofrequency ablation of 1 enlarging RUL nodule.  At the time of the procedure, the nodule had shrunk to 5 mm from 9  mm.     # Stage I RLL adenocarcinoma 2019   CT guided RLL biopsy on 02/09/2018 revealed adenocarcinoma of lung origin, lepidic and papillary patterns.   She received 6000 cGy in 5 fractions to RLL nodule from 03/22/2018 - 04/05/2018.   Chest CT on 03/04/2020 revealed enlarging and new pulmonary nodules suspicious for metastatic disease or metachronous primary.  PET scan on 03/19/2020 revealed that the right lower lobe pulmonary nodules did not demonstrate any hypermetabolism to suggest neoplasm. However, these were somewhat worrisome; recommendation was close CT  surveillance. There was no mediastinal or hilar lymphadenopathy.  Chest CT  on 09/11/2020 revealed interval treatment of the right lower lobe nodules. These nodules were partly obscured by adjacent parenchymal opacity, although appear decreased in size.  CT guided RLL biopsy on 04/24/2020 revealed adenocarcinoma of the lung with papillary and solid patterns. 05/28/2020 - 06/11/2020  SBRT to the right lung. She received 60 Gy in 5 fractions.  She has an elevated CEA of unclear etiology.  CEA was 8.1 on 01/31/2016, 9.2 on 03/03/2016, 7.9 on 10/12/2017, and 7.9 on 01/19/2019.   Colonoscopy on 06/25/2015 revealed a 4 mm polyp in the sigmoid colon.  Pathology revealed a tubular adenoma without evidence of dysplasia or malignancy.   Pelvic MRI on 04/30/2016 revealed a 3.4 x 2.5 cm ovoid mass in the LEFT adnexa with signal characteristics identical to the intramural leiomyomas and consistent with an exophytic leiomyoma.  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  Endometrium was normal with a normal junctional zone.  Ovaries were not identified.  # iron deficiency anemia.  She required 2 units of PRBCs on 04/04/2020.  Colonoscopy on 06/25/2015 revealed one 4 mm polyp (tubular adenoma) in the sigmoid colon. There were non-bleeding internal hemorrhoids.  Colonoscopy on 07/26/2020 revealed diverticulosis in the entire examined colon. There were non-bleeding internal hemorrhoids. EGD on 07/26/2020 revealed erosive gastropathy with no bleeding and no stigmata of recent bleeding.   07/11/2021, patient was hospitalized and a CT angio chest PE with contrast was ordered and there was slight increase in size".  3 over several months of larger than 1 cm nodular-like densities along the right minor and major fissure in the region of prior radiation therapy.  Recurrent malignancy cannot be excluded.   12/29 2022, a PET scan was obtained for further evaluation.  PET scan showed stable scarring type changes involving the  right lung.  No hypermetabolic pulmonary lesions to suggest recurrent or metastatic tumor.  02/20/2022, CT chest with contrast showed continued interval progression of the consolidative plaque-like opacity in the right lower lobe.  This area was previously FDG negative on PET scan in December 2022.  New 5 mm left lower lobe pulmonary nodule.  09/21/2022, CT chest with contrast showed 1. Nondisplaced mildly-comminuted fracture of the posterolateral right ninth rib. 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 No pneumothorax. 2. Stable examination demonstrating chronic postradiation changes in the right lung with no definitive evidence to suggest locally recurrent disease or definite metastatic disease in the thorax.  3. Diffuse bronchial wall thickening with moderate centrilobular and paraseptal emphysema; imaging findings suggestive of underlying COPD. 4. Aortic atherosclerosis,  03/24/2023 CT chest w contrast  1. New 4 mm apical segment right upper lobe nodule, worrisome for disease progression. 2. Pathologic right rib fractures, as before, possibly radiation related. 3. Aortic atherosclerosis (ICD10-I70.0). Coronary artery calcification. 4.  Emphysema  INTERVAL HISTORY Angela Jensen is  a 68 y.o. female who has above history reviewed by me today presents for follow up visit for iron deficiency anemia, history of lung cancer  She reports feeling well, chronic SOB, on nasal cannula oxygen.  Tolerated IV Venofer treatments.  She  takes oral iron supplementation twice daily.    Past Medical History:  Diagnosis Date   Anemia    Arthritis    Asthma    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/2016   Cancer (HCC) 2012   Right Lung CA   COPD (chronic obstructive pulmonary disease) (HCC)    Depression    Diabetes mellitus without complication (HCC)     Patient takes Janumet   Essential hypertension 05/26/2016   Heart failure (HCC) 2022   Hydropneumothorax 05/03/2020   Hypercholesteremia    Oxygen dependent    2L at nite    PAD (peripheral artery disease) (HCC) 06/22/2016   Peripheral vascular disease (HCC)    Personal history of radiation therapy    Shortness of breath dyspnea    with exertion    Sialolithiasis    Sleep apnea    Wears dentures    full upper and lower    Past Surgical History:  Procedure Laterality Date   CESAREAN SECTION     x3   COLONOSCOPY WITH PROPOFOL N/A 06/25/2015   Procedure: COLONOSCOPY WITH PROPOFOL;  Surgeon: Midge Minium, MD;  Location: [facility];  Service: Endoscopy;  Laterality: N/A;   COLONOSCOPY WITH PROPOFOL N/A 07/26/2020   Procedure: COLONOSCOPY WITH PROPOFOL;  Surgeon: Midge Minium, MD;  Location: [facility];  Service: Endoscopy;  Laterality: N/A;   CYST REMOVAL LEG     and on shoulder    ESOPHAGOGASTRODUODENOSCOPY (EGD) WITH PROPOFOL N/A 07/26/2020   Procedure: ESOPHAGOGASTRODUODENOSCOPY (EGD) WITH PROPOFOL;  Surgeon: Midge Minium, MD;  Location: [facility];  Service: Endoscopy;  Laterality: N/A;  Diabetic - oral meds   LOWER EXTREMITY ANGIOGRAPHY Left 09/29/2018   Procedure: LOWER EXTREMITY ANGIOGRAPHY;  Surgeon: Annice Needy, MD;  Location: [facility];  Service: Cardiovascular;  Laterality: Left;   LOWER EXTREMITY ANGIOGRAPHY Left 07/29/2023   Procedure: Lower Extremity Angiography;  Surgeon: Annice Needy, MD;  Location: [facility];  Service: Cardiovascular;  Laterality: Left;   LUNG BIOPSY  12/30/2011   has lung "spots"   PACEMAKER IMPLANT  07/14/2021   PACEMAKER LEADLESS INSERTION N/A 07/14/2021   Procedure: PACEMAKER LEADLESS INSERTION;  Surgeon: Marcina Millard, MD;  Location: [facility];  Service: Cardiovascular;  Laterality: N/A;   PERIPHERAL VASCULAR CATHETERIZATION Left 06/01/2016   Procedure: Lower Extremity  Angiography;  Surgeon: Annice Needy, MD;  Location: [facility];  Service: Cardiovascular;  Laterality: Left;   PERIPHERAL VASCULAR CATHETERIZATION N/A 06/01/2016   Procedure: Abdominal Aortogram w/Lower Extremity;  Surgeon: Annice Needy, MD;  Location: [facility];  Service: Cardiovascular;  Laterality: N/A;   PERIPHERAL VASCULAR CATHETERIZATION  06/01/2016   Procedure: Lower Extremity Intervention;  Surgeon: Annice Needy, MD;  Location: [facility];  Service: Cardiovascular;;   PERIPHERAL VASCULAR CATHETERIZATION Right 06/08/2016   Procedure: Lower Extremity Angiography;  Surgeon: Annice Needy, MD;  Location: [facility];  Service: Cardiovascular;  Laterality: Right;   PERIPHERAL VASCULAR CATHETERIZATION  06/08/2016   Procedure: Lower Extremity Intervention;  Surgeon: Annice Needy, MD;  Location: [facility];  Service: Cardiovascular;;   SUBMANDIBULAR GLAND EXCISION Left 12/06/2020  Procedure: EXCISION SUBMANDIBULAR GLAND;  Surgeon: Linus Salmons, MD;  Location: [facility];  Service: ENT;  Laterality: Left;  needs to be first case Diabetic - diet controlled   TEMPORARY PACEMAKER N/A 07/11/2021   Procedure: TEMPORARY PACEMAKER;  Surgeon: Marcina Millard, MD;  Location: [facility];  Service: Cardiovascular;  Laterality: N/A;    Family History  Problem Relation Age of Onset   Diabetes Mother    Hypercholesterolemia Mother    Lung cancer Father    Diabetes Sister    Diabetes Sister    Hypertension Sister    Diabetes Maternal Grandmother    Diabetes Paternal Grandmother    Heart attack Brother    Coronary artery disease Brother    Vascular Disease Brother    Heart attack Brother    Breast cancer Neg Hx     Social History:  reports that she quit smoking about 13 years ago. Her smoking use included cigarettes. She started smoking about 50 years ago. She has a 37 pack-year smoking history. She has quit using smokeless  tobacco.  Her smokeless tobacco use included snuff. She reports that she does not currently use alcohol after a past usage of about 5.0 standard drinks of alcohol per week. She reports that she does not currently use drugs after having used the following drugs: Marijuana, "Crack" cocaine, and Cocaine.   Allergies:  Allergies  Allergen Reactions   Trelegy Ellipta [Fluticasone-Umeclidin-Vilant]     Had breathing issues    Current Medications: Current Outpatient Medications  Medication Sig Dispense Refill   ACCU-CHEK GUIDE TEST test strip USE TO TEST BLOOD SUGAR 5 TIMES DAILY 450 each 0   Accu-Chek Softclix Lancets lancets Use 1 lancet 3 times daily to check glucose for diabetes 300 each 1   aspirin EC 81 MG tablet Take 1 tablet (81 mg total) by mouth daily. Swallow whole. 150 tablet 2   atorvastatin (LIPITOR) 10 MG tablet Take 1 tablet (10 mg total) by mouth daily. 30 tablet 11   Blood Glucose Monitoring Suppl (ACCU-CHEK GUIDE) w/Device KIT Use as directed Dx e11.65 1 kit 0   calcium-vitamin D (OSCAL WITH D) 500-5 MG-MCG tablet Take 1 tablet by mouth 2 (two) times daily. 100 tablet 0   Continuous Blood Gluc Receiver (DEXCOM G7 RECEIVER) DEVI Use one as directed for uncontrolled dm 1 each 0   Continuous Glucose Sensor (DEXCOM G7 SENSOR) MISC USE AS DIRECTED. CHANGE EVERY 10 DAYS. 3 each 0   Ensifentrine (OHTUVAYRE) 3 MG/2.5ML SUSP Inhale 3 mg into the lungs in the morning and at bedtime. 150 mL 11   escitalopram (LEXAPRO) 10 MG tablet Take 1 tablet (10 mg total) by mouth daily. 90 tablet 1   ferrous sulfate 325 (65 FE) MG tablet Take 325 mg by mouth 2 (two) times daily with a meal.     furosemide (LASIX) 20 MG tablet Take 1 tablet (20 mg total) by mouth daily as needed. 30 tablet 5   gabapentin (NEURONTIN) 300 MG capsule TAKE 1 CAPSULE BY MOUTH THREE TIMES DAILY 90 capsule 0   insulin glargine, 1 Unit Dial, (TOUJEO) 300 UNIT/ML Solostar Pen Inject 10 Units into the skin daily. 1.5 mL 11    Insulin Pen Needle (RELION PEN NEEDLES) 31G X 6 MM MISC USE 1 PEN NEEDLE WITH INSULIN PEN ONCE DAILY FOR DIABETES 50 each 3   ipratropium-albuterol (DUONEB) 0.5-2.5 (3) MG/3ML SOLN Take 3 mLs by nebulization every 4 (four) hours as needed. 360 mL 3  mometasone-formoterol (DULERA) 200-5 MCG/ACT AERO Inhale 2 puffs by mouth twice daily 13 g 12   OXYGEN Inhale 4 L into the lungs. PT USES [facility] FOR OXYGEN     potassium chloride (KLOR-CON) 10 MEQ tablet Take 1 tablet (10 mEq total) by mouth every other day. 45 tablet 2   SPIRIVA HANDIHALER 18 MCG inhalation capsule INHALE THE CONTENTS OF 1 CAPSULES BY MOUTH ONCE DAILY - DO NOT SWALLOW CAPSULES 90 capsule 3   vitamin B-12 (CYANOCOBALAMIN) 100 MCG tablet Take 100 mcg by mouth daily.     VITAMIN D, CHOLECALCIFEROL, PO Take 1 tablet by mouth daily with supper.     albuterol (VENTOLIN HFA) 108 (90 Base) MCG/ACT inhaler INHALE 2 PUFFS BY MOUTH EVERY 6 HOURS AS NEEDED FOR WHEEZING OR SHORTNESS OF BREATH 3 each 5   ALPRAZolam (XANAX) 0.25 MG tablet Take 1 tablet (0.25 mg total) by mouth 2 (two) times daily as needed for anxiety. 60 tablet 2   clopidogrel (PLAVIX) 75 MG tablet Take 1 tablet (75 mg total) by mouth daily. 90 tablet 3   cyclobenzaprine (FLEXERIL) 10 MG tablet Take 1 tablet (10 mg total) by mouth at bedtime. Take one tab po qhs for back spasm prn only 30 tablet 3   famotidine (PEPCID) 20 MG tablet Take 1 tablet (20 mg total) by mouth daily for 14 days. (Patient taking differently: Take 20 mg by mouth daily as needed for heartburn.) 14 tablet 0   lisinopril-hydrochlorothiazide (ZESTORETIC) 20-25 MG tablet Take 1 tablet by mouth daily. 90 tablet 3   metFORMIN (GLUCOPHAGE-XR) 500 MG 24 hr tablet Take 2 tablets (1,000 mg total) by mouth daily with breakfast. 180 tablet 1   metoprolol succinate (TOPROL-XL) 25 MG 24 hr tablet Take 1 tablet (25 mg total) by mouth daily. 90 tablet 3   No current facility-administered medications for this visit.     Review of Systems  Constitutional:  Negative for appetite change, chills, fatigue and fever.  HENT:   Negative for hearing loss and voice change.   Eyes:  Negative for eye problems.  Respiratory:  Positive for shortness of breath. Negative for chest tightness and cough.   Cardiovascular:  Negative for chest pain.  Gastrointestinal:  Negative for abdominal distention, abdominal pain and blood in stool.  Endocrine: Negative for hot flashes.  Genitourinary:  Negative for difficulty urinating and frequency.   Musculoskeletal:  Negative for arthralgias.  Skin:  Negative for itching and rash.  Neurological:  Negative for extremity weakness.  Hematological:  Negative for adenopathy.  Psychiatric/Behavioral:  Negative for confusion.       Performance status (ECOG): 1  Vitals:  Blood pressure 102/76, pulse (!) 105, temperature 98.4 F (36.9 C), resp. rate 18, weight 122 lb 6.4 oz (55.5 kg), SpO2 97%, peak flow (!) 4 L/min. Physical Exam Constitutional:      General: She is not in acute distress.    Appearance: She is not diaphoretic.  HENT:     Head: Normocephalic and atraumatic.  Eyes:     General: No scleral icterus. Cardiovascular:     Rate and Rhythm: Normal rate.  Pulmonary:     Effort: Pulmonary effort is normal. No respiratory distress.     Comments: Decreased breath sound bilaterally. Breathing is comfortable on nasal cannula oxygen Abdominal:     General: There is no distension.  Musculoskeletal:        General: Normal range of motion.     Cervical back: Normal range of motion  and neck supple.  Skin:    Findings: No erythema.  Neurological:     Mental Status: She is alert and oriented to person, place, and time. Mental status is at baseline.     Cranial Nerves: No cranial nerve deficit.     Motor: No abnormal muscle tone.  Psychiatric:        Mood and Affect: Mood and affect normal.      RADIOGRAPHIC STUDIES: I have personally reviewed the radiological  images as listed and agreed with the findings in the report. MM 3D SCREENING MAMMOGRAM BILATERAL BREAST Result Date: 10/12/2023 CLINICAL DATA:  Screening. EXAM: DIGITAL SCREENING BILATERAL MAMMOGRAM WITH TOMOSYNTHESIS AND CAD TECHNIQUE: Bilateral screening digital craniocaudal and mediolateral oblique mammograms were obtained. Bilateral screening digital breast tomosynthesis was performed. The images were evaluated with computer-aided detection. COMPARISON:  Previous exam(s). ACR Breast Density Category b: There are scattered areas of fibroglandular density. FINDINGS: There are no findings suspicious for malignancy. IMPRESSION: No mammographic evidence of malignancy. A result letter of this screening mammogram will be mailed directly to the patient. RECOMMENDATION: Screening mammogram in one year. (Code:[id number]-B-01Y) BI-RADS CATEGORY  1: Negative. Electronically Signed   By: Amie Portland M.D.   On: 10/12/2023 09:24   CT Chest W Contrast Result Date: 10/01/2023 CLINICAL DATA:  Non-small-cell lung cancer of right upper lobe diagnosed in 2012. Radiation therapy finished in 2013. Right lower lobe recurrence in 2019 and finished radiation therapy. Currently asymptomatic. On home oxygen. Ex-smoker. * Tracking Code: BO * EXAM: CT CHEST WITH CONTRAST TECHNIQUE: Multidetector CT imaging of the chest was performed during intravenous contrast administration. RADIATION DOSE REDUCTION: This exam was performed according to the departmental dose-optimization program which includes automated exposure control, adjustment of the mA and/or kV according to patient size and/or use of iterative reconstruction technique. CONTRAST:  75mL OMNIPAQUE IOHEXOL 300 MG/ML  SOLN COMPARISON:  Direct comparison made to 03/24/2023, given pneumothorax on 04/16/2023 exam. FINDINGS: Cardiovascular: Aortic atherosclerosis. Normal heart size, without pericardial effusion. Leadless pacer. Lad coronary artery calcification. No central pulmonary embolism,  on this non-dedicated study. Mediastinum/Nodes: No mediastinal or hilar adenopathy. Lungs/Pleura: Trace right pleural fluid is unchanged. Moderate centrilobular emphysema. Right upper lobe/apical scarring is similar. Areas of posterior right apical nodularity x2 including on 24/3 measure maximally 4 mm and are unchanged. Just caudal to this, a solid 4 mm nodule on 33/3 is unchanged. Presumably radiation induced consolidation within the lateral superior right middle lobe, including on 76/3. Posterolateral right lower lobe consolidation and ground-glass are primarily similar, without local recurrence. There is increased ground-glass and septal thickening within the subpleural medial right lower lobe including on 105/3. Lateral left upper lobe 5 mm nodule on 42/3 is similar to the prior. Upper Abdomen: Normal imaged portions of the liver, spleen, stomach, pancreas, gallbladder, adrenal glands, kidneys. Abdominal aortic atherosclerosis. Circumaortic left renal vein. Musculoskeletal: Osteopenia. 9th rib chronic fracture and heterogeneous decreased density, likely related to radiation induced necrosis. 7th lateral right rib fracture and irregularity is also chronic and likely radiation induced. IMPRESSION: 1. 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 No local recurrence. There is increased ground-glass and septal thickening within the medial right lower lobe which is indeterminate. Given reported history of remote radiation therapy, progressive radiation change would be atypical. Considerations include mild pulmonary edema or superimposed infection. 2. Bilateral pulmonary nodules are unchanged since 03/24/2023, indeterminate and warrant follow-up attention. 3. No thoracic adenopathy. 4. Similar trace right pleural fluid. Resolution of right pneumothorax  since 04/16/2023. 5. Aortic atherosclerosis (ICD10-I70.0), coronary artery atherosclerosis and emphysema (ICD10-J43.9).  Electronically Signed   By: Jeronimo Greaves M.D.   On: 10/01/2023 12:26   VAS [name] ABI WITH/WO TBI Result Date: 09/01/2023  LOWER EXTREMITY DOPPLER STUDY Patient Name:  ADREANA COULL  Date of Exam:   09/01/2023 Medical Rec #: [id number]     Accession #:    [id number] Date of Birth: [birth year]     Patient Gender: F Patient Age:   48 years Exam Location:  [facility] Procedure:      VAS [name] ABI WITH/WO TBI Referring Phys: Festus Barren --------------------------------------------------------------------------------  Indications: Claudication, and peripheral artery disease. Other Factors: Left SFA stents are known to be occluded.                Patient complains of constant numbness in left foot.                 07/29/2023: Aortogram and selective Left Lower                ExtremityAngiogram. Mechanical thrombectomy of the Left SFA and                Popliteal Artery with the [place] Rex device. Stent placement to the                Proximal Left SFA with 6 mm diameter by 5 cm length Viabahn                stent. Stent placement to the Left Popliteal Artery above the                knee with 6 mm diameter by 5 cm length Viabahn stent.  Vascular Interventions: 06/01/16: Left distal SFA/popliteal arteries PTAs with                         SFA stent x2;                         06/08/16: Left SFA PTA;                         09/29/18: Left SFA stent x2;. Comparison Study: 06/03/2023 Performing Technologist: Debbe Bales RVS  Examination Guidelines: A complete evaluation includes at minimum, Doppler waveform signals and systolic blood pressure reading at the level of bilateral brachial, anterior tibial, and posterior tibial arteries, when vessel segments are accessible. Bilateral testing is considered an integral part of a complete examination. Photoelectric Plethysmograph (PPG) waveforms and toe systolic pressure readings are included as required and additional duplex testing as needed. Limited examinations for  reoccurring indications may be performed as noted.  ABI Findings: +---------+------------------+-----+--------+--------+ Right    Rt Pressure (mmHg)IndexWaveformComment  +---------+------------------+-----+--------+--------+ Brachial 128                                     +---------+------------------+-----+--------+--------+ ATA      85                0.62 biphasic         +---------+------------------+-----+--------+--------+ PTA      101               0.74 biphasic         +---------+------------------+-----+--------+--------+ Albin Felling  0.64 Abnormal         +---------+------------------+-----+--------+--------+ +---------+------------------+-----+---------+-------+ Left     Lt Pressure (mmHg)IndexWaveform Comment +---------+------------------+-----+---------+-------+ Brachial 136                                     +---------+------------------+-----+---------+-------+ ATA      120               0.88 triphasic        +---------+------------------+-----+---------+-------+ PTA      123               0.90 biphasic         +---------+------------------+-----+---------+-------+ Great Toe117               0.86 Normal           +---------+------------------+-----+---------+-------+ +-------+-----------+-----------+------------+------------+ ABI/TBIToday's ABIToday's TBIPrevious ABIPrevious TBI +-------+-----------+-----------+------------+------------+ Right  .74        .64        .89         .79          +-------+-----------+-----------+------------+------------+ Left   .90        .86        .68         .37          +-------+-----------+-----------+------------+------------+ Right ABIs and TBIs appear decreased compared to prior study on 06/03/2023. Left ABIs and TBIs appear increased compared to prior study on 06/03/2023.  Summary: Right: Resting right ankle-brachial index indicates moderate right lower extremity arterial  disease. The right toe-brachial index is abnormal. Left: Resting left ankle-brachial index indicates mild left lower extremity arterial disease. The left toe-brachial index is normal. *See table(s) above for measurements and observations.  Electronically signed by Festus Barren MD on 09/01/2023 at 4:26:10 PM.    Final    PERIPHERAL VASCULAR CATHETERIZATION Result Date: 07/29/2023 See surgical note for result.  MR Brain W Wo Contrast Result Date: 07/27/2023 CLINICAL DATA:  Provided history: Primary malignant neoplasm of right lung. Headache. EXAM: MRI HEAD WITHOUT AND WITH CONTRAST TECHNIQUE: Multiplanar, multiecho pulse sequences of the brain and surrounding structures were obtained without and with intravenous contrast. CONTRAST:  5mL GADAVIST GADOBUTROL 1 MMOL/ML IV SOLN COMPARISON:  Head CT 08/03/2010. FINDINGS: Intermittently motion degraded examination (with up to moderate motion degradation of the acquired sequences). This includes motion degradation of the T1-weighted post-contrast imaging. Within this limitation, findings are as follows. Brain: No age advanced or lobar predominant parenchymal atrophy. Mild multifocal T2 FLAIR hyperintense signal abnormality within the cerebral white matter, nonspecific but most often secondary to chronic small vessel ischemia. There is no acute infarct. No evidence of an intracranial mass. No chronic intracranial blood products. No extra-axial fluid collection. No midline shift. No pathologic intracranial enhancement identified. Vascular: Maintained flow voids within the proximal large arterial vessels. Skull and upper cervical spine: No focal worrisome marrow lesion. Incompletely assessed cervical spondylosis. Sinuses/Orbits: No mass or acute finding within the imaged orbits. No significant paranasal sinus disease. IMPRESSION: 1. Intermittently motion degraded examination as described. 2. Within this limitation, no evidence of intracranial metastatic disease or an acute  intracranial abnormality. 3. Mild multifocal T2 FLAIR hyperintense signal abnormality within the cerebral white matter, nonspecific but most often secondary to chronic small vessel ischemia. Electronically Signed   By: Jackey Loge D.O.   On: 07/27/2023 17:52

## 2023-10-13 NOTE — Telephone Encounter (Signed)
 Send message to [employer] for nebulizer machine

## 2023-10-13 NOTE — Assessment & Plan Note (Addendum)
 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  Lab Results  Component Value Date   MPROTEIN 0.4 (H) 10/08/2023   KPAFRELGTCHN 17.9 10/08/2023   LAMBDASER 18.4 10/08/2023   KAPLAMBRATIO 0.97 10/08/2023  Slight increase in M protein to 0.4. I recommend observation. Check SPEP and light chain ratio every 6 months.

## 2023-10-13 NOTE — Progress Notes (Signed)
 [facility] [address] [place], [place] [address]  Internal MEDICINE  Office Visit Note  Patient Name: Angela Jensen  [id number]  [id number]  Date of Service: 10/13/2023  Chief Complaint  Patient presents with   Follow-up    nebulizer   Depression   Diabetes   Hypertension   Quality Metric Gaps    Diabetic eye exam    HPI Asante presents for a follow-up visit for diabetes, hypertension, COPD, high cholesterol and refills.  Diabetes -- her A1c is significantly elevated when compared to August last year at 8.9. has been eating more high sugar foods and has had some prednisone off and on over the past several months  Hypertension -- controlled with current medications  COPD -- needs a new nebulizer machine. Her nebulizer is several years old and is not working as well as it used to. Still using her supplemental oxygen at 3 LPM via [place].  High cholesterol -- taking atorvastatin.  History of lung cancer -- still followed by oncology    Current Medication: Outpatient Encounter Medications as of 10/13/2023  Medication Sig   metFORMIN (GLUCOPHAGE-XR) 500 MG 24 hr tablet Take 2 tablets (1,000 mg total) by mouth daily with breakfast.   ACCU-CHEK GUIDE TEST test strip USE TO TEST BLOOD SUGAR 5 TIMES DAILY   Accu-Chek Softclix Lancets lancets Use 1 lancet 3 times daily to check glucose for diabetes   albuterol (VENTOLIN HFA) 108 (90 Base) MCG/ACT inhaler INHALE 2 PUFFS BY MOUTH EVERY 6 HOURS AS NEEDED FOR WHEEZING OR SHORTNESS OF BREATH   ALPRAZolam (XANAX) 0.25 MG tablet Take 1 tablet (0.25 mg total) by mouth 2 (two) times daily as needed for anxiety.   aspirin EC 81 MG tablet Take 1 tablet (81 mg total) by mouth daily. Swallow whole.   atorvastatin (LIPITOR) 10 MG tablet Take 1 tablet (10 mg total) by mouth daily.   Blood Glucose Monitoring Suppl (ACCU-CHEK GUIDE) w/Device KIT Use as directed Dx e11.65   calcium-vitamin D (OSCAL WITH D) 500-5 MG-MCG tablet Take 1 tablet by  mouth 2 (two) times daily.   clopidogrel (PLAVIX) 75 MG tablet Take 1 tablet (75 mg total) by mouth daily.   Continuous Blood Gluc Receiver (DEXCOM G7 RECEIVER) DEVI Use one as directed for uncontrolled dm   Continuous Glucose Sensor (DEXCOM G7 SENSOR) MISC USE AS DIRECTED. CHANGE EVERY 10 DAYS.   cyclobenzaprine (FLEXERIL) 10 MG tablet Take 1 tablet (10 mg total) by mouth at bedtime. Take one tab po qhs for back spasm prn only   Ensifentrine (OHTUVAYRE) 3 MG/2.5ML SUSP Inhale 3 mg into the lungs in the morning and at bedtime.   escitalopram (LEXAPRO) 10 MG tablet Take 1 tablet (10 mg total) by mouth daily.   famotidine (PEPCID) 20 MG tablet Take 1 tablet (20 mg total) by mouth daily for 14 days. (Patient taking differently: Take 20 mg by mouth daily as needed for heartburn.)   ferrous sulfate 325 (65 FE) MG tablet Take 325 mg by mouth 2 (two) times daily with a meal.   furosemide (LASIX) 20 MG tablet Take 1 tablet (20 mg total) by mouth daily as needed.   gabapentin (NEURONTIN) 300 MG capsule TAKE 1 CAPSULE BY MOUTH THREE TIMES DAILY   insulin glargine, 1 Unit Dial, (TOUJEO) 300 UNIT/ML Solostar Pen Inject 10 Units into the skin daily.   Insulin Pen Needle (RELION PEN NEEDLES) 31G X 6 MM MISC USE 1 PEN NEEDLE WITH INSULIN PEN ONCE DAILY FOR  DIABETES   ipratropium-albuterol (DUONEB) 0.5-2.5 (3) MG/3ML SOLN Take 3 mLs by nebulization every 4 (four) hours as needed.   lisinopril-hydrochlorothiazide (ZESTORETIC) 20-25 MG tablet Take 1 tablet by mouth daily.   metoprolol succinate (TOPROL-XL) 25 MG 24 hr tablet Take 1 tablet (25 mg total) by mouth daily.   mometasone-formoterol (DULERA) 200-5 MCG/ACT AERO Inhale 2 puffs by mouth twice daily   OXYGEN Inhale 4 L into the lungs. PT USES [facility] FOR OXYGEN   potassium chloride (KLOR-CON) 10 MEQ tablet Take 1 tablet (10 mEq total) by mouth every other day.   SPIRIVA HANDIHALER 18 MCG inhalation capsule INHALE THE CONTENTS OF 1 CAPSULES BY MOUTH ONCE  DAILY - DO NOT SWALLOW CAPSULES   vitamin B-12 (CYANOCOBALAMIN) 100 MCG tablet Take 100 mcg by mouth daily.   VITAMIN D, CHOLECALCIFEROL, PO Take 1 tablet by mouth daily with supper.   [DISCONTINUED] albuterol (VENTOLIN HFA) 108 (90 Base) MCG/ACT inhaler INHALE 2 PUFFS BY MOUTH EVERY 6 HOURS AS NEEDED FOR WHEEZING OR SHORTNESS OF BREATH   [DISCONTINUED] ALPRAZolam (XANAX) 0.25 MG tablet Take 1 tablet by mouth twice daily as needed for anxiety   [DISCONTINUED] clopidogrel (PLAVIX) 75 MG tablet Take 1 tablet (75 mg total) by mouth daily.   [DISCONTINUED] cyclobenzaprine (FLEXERIL) 10 MG tablet Take 1 tablet (10 mg total) by mouth at bedtime. Take one tab po qhs for back spasm prn only   [DISCONTINUED] lisinopril-hydrochlorothiazide (ZESTORETIC) 20-25 MG tablet Take 1 tablet by mouth daily.   [DISCONTINUED] metFORMIN (GLUCOPHAGE) 500 MG tablet TAKE 2 TABLETS BY MOUTH ONCE DAILY WITH BREAKFAST   [DISCONTINUED] metoprolol succinate (TOPROL-XL) 25 MG 24 hr tablet Take 1 tablet (25 mg total) by mouth daily.   No facility-administered encounter medications on file as of 10/13/2023.    Surgical History: Past Surgical History:  Procedure Laterality Date   CESAREAN SECTION     x3   COLONOSCOPY WITH PROPOFOL N/A 06/25/2015   Procedure: COLONOSCOPY WITH PROPOFOL;  Surgeon: Midge Minium, MD;  Location: [facility];  Service: Endoscopy;  Laterality: N/A;   COLONOSCOPY WITH PROPOFOL N/A 07/26/2020   Procedure: COLONOSCOPY WITH PROPOFOL;  Surgeon: Midge Minium, MD;  Location: [facility];  Service: Endoscopy;  Laterality: N/A;   CYST REMOVAL LEG     and on shoulder    ESOPHAGOGASTRODUODENOSCOPY (EGD) WITH PROPOFOL N/A 07/26/2020   Procedure: ESOPHAGOGASTRODUODENOSCOPY (EGD) WITH PROPOFOL;  Surgeon: Midge Minium, MD;  Location: [facility];  Service: Endoscopy;  Laterality: N/A;  Diabetic - oral meds   LOWER EXTREMITY ANGIOGRAPHY Left 09/29/2018   Procedure: LOWER EXTREMITY ANGIOGRAPHY;   Surgeon: Annice Needy, MD;  Location: [facility];  Service: Cardiovascular;  Laterality: Left;   LOWER EXTREMITY ANGIOGRAPHY Left 07/29/2023   Procedure: Lower Extremity Angiography;  Surgeon: Annice Needy, MD;  Location: [facility];  Service: Cardiovascular;  Laterality: Left;   LUNG BIOPSY  12/30/2011   has lung "spots"   PACEMAKER IMPLANT  07/14/2021   PACEMAKER LEADLESS INSERTION N/A 07/14/2021   Procedure: PACEMAKER LEADLESS INSERTION;  Surgeon: Marcina Millard, MD;  Location: [facility];  Service: Cardiovascular;  Laterality: N/A;   PERIPHERAL VASCULAR CATHETERIZATION Left 06/01/2016   Procedure: Lower Extremity Angiography;  Surgeon: Annice Needy, MD;  Location: [facility];  Service: Cardiovascular;  Laterality: Left;   PERIPHERAL VASCULAR CATHETERIZATION N/A 06/01/2016   Procedure: Abdominal Aortogram w/Lower Extremity;  Surgeon: Annice Needy, MD;  Location: [facility];  Service: Cardiovascular;  Laterality: N/A;   PERIPHERAL VASCULAR CATHETERIZATION  06/01/2016   Procedure: Lower Extremity Intervention;  Surgeon: Annice Needy, MD;  Location: [facility];  Service: Cardiovascular;;   PERIPHERAL VASCULAR CATHETERIZATION Right 06/08/2016   Procedure: Lower Extremity Angiography;  Surgeon: Annice Needy, MD;  Location: [facility];  Service: Cardiovascular;  Laterality: Right;   PERIPHERAL VASCULAR CATHETERIZATION  06/08/2016   Procedure: Lower Extremity Intervention;  Surgeon: Annice Needy, MD;  Location: [facility];  Service: Cardiovascular;;   SUBMANDIBULAR GLAND EXCISION Left 12/06/2020   Procedure: EXCISION SUBMANDIBULAR GLAND;  Surgeon: Linus Salmons, MD;  Location: [facility];  Service: ENT;  Laterality: Left;  needs to be first case Diabetic - diet controlled   TEMPORARY PACEMAKER N/A 07/11/2021   Procedure: TEMPORARY PACEMAKER;  Surgeon: Marcina Millard, MD;  Location: [facility]  [facility];  Service: Cardiovascular;  Laterality: N/A;    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/2016   Cancer (HCC) 2012   Right Lung CA   COPD (chronic obstructive pulmonary disease) (HCC)    Depression    Diabetes mellitus without complication (HCC)    Patient takes Janumet   Essential hypertension 05/26/2016   Heart failure (HCC) 2022   Hydropneumothorax 05/03/2020   Hypercholesteremia    Oxygen dependent    2L at nite    PAD (peripheral artery disease) (HCC) 06/22/2016   Peripheral vascular disease (HCC)    Personal history of radiation therapy    Shortness of breath dyspnea    with exertion    Sialolithiasis    Sleep apnea    Wears dentures    full upper and lower    Family History: Family History  Problem Relation Age of Onset   Diabetes Mother    Hypercholesterolemia Mother    Lung cancer Father    Diabetes Sister    Diabetes Sister    Hypertension Sister    Diabetes Maternal Grandmother    Diabetes Paternal Grandmother    Heart attack Brother    Coronary artery disease Brother    Vascular Disease Brother    Heart attack Brother    Breast cancer Neg Hx     Social History   Socioeconomic History   Marital status: Widowed    Spouse name: Not on file   Number of children: Not on file   Years of education: Not on file   Highest education level: Not on file  Occupational History   Not on file  Tobacco Use   Smoking status: Former    Current packs/day: 0.00    Average packs/day: 1 pack/day for 37.0 years (37.0 ttl pk-yrs)    Types: Cigarettes    Start date: 02/06/1973    Quit date: 02/06/2010    Years since quitting: 13.6   Smokeless tobacco: Former    Types: Snuff  Vaping Use   Vaping status: Never Used  Substance and Sexual Activity   Alcohol use: Not Currently    Alcohol/week: 5.0 standard drinks of  alcohol    Types: 5 Cans of beer per week    Comment: /h x of alcohol abuse -stopped 2012- now drinks 5 beer per week   Drug use: Not Currently    Types: Marijuana, "Crack" cocaine, Cocaine    Comment: hx of cocaine use- last  use 2015; last use marijuana6/22/19,   Sexual activity: Yes  Other Topics Concern   Not on file  Social History Narrative   Lives with Significant Other x 43 years   Social Drivers of Corporate investment banker Strain: Not on file  Food Insecurity: No Food Insecurity (06/15/2023)   Hunger Vital Sign    Worried About Running Out of Food in the Last Year: Never true    Ran Out of Food in the Last Year: Never true  Transportation Needs: No Transportation Needs (06/15/2023)   PRAPARE - Administrator, Civil Service (Medical): No    Lack of Transportation (Non-Medical): No  Physical Activity: Not on file  Stress: Not on file  Social Connections: Not on file  Intimate Partner Violence: Not At Risk (06/15/2023)   Humiliation, Afraid, Rape, and Kick questionnaire    Fear of Current or Ex-Partner: No    Emotionally Abused: No    Physically Abused: No    Sexually Abused: No      Review of Systems  Constitutional:  Negative for chills, fatigue and unexpected weight change.  HENT:  Negative for congestion, rhinorrhea, sneezing and sore throat.   Eyes:  Negative for redness.  Respiratory:  Positive for cough (intermittent), shortness of breath (intermittent) and wheezing (intermittent). Negative for chest tightness.   Cardiovascular: Negative.  Negative for chest pain and palpitations.  Gastrointestinal: Negative.  Negative for abdominal pain, constipation, diarrhea, nausea and vomiting.  Genitourinary:  Negative for dysuria and frequency.  Musculoskeletal:  Positive for arthralgias and back pain. Negative for joint swelling and neck pain.  Skin:  Negative for rash.  Neurological: Negative.  Negative for tremors and numbness.  Hematological:   Negative for adenopathy. Does not bruise/bleed easily.  Psychiatric/Behavioral:  Negative for behavioral problems (Depression), sleep disturbance and suicidal ideas. The patient is not nervous/anxious.     Vital Signs: BP 138/70   Pulse (!) 107   Temp 97.8 F (36.6 C)   Resp 16   Ht 4\' 11"  (1.499 m)   Wt 124 lb (56.2 kg)   SpO2 92% Comment: 3L  BMI 25.04 kg/m    Physical Exam Vitals reviewed.  Constitutional:      General: She is not in acute distress.    Appearance: Normal appearance. She is normal weight. She is not ill-appearing.  HENT:     Head: Normocephalic and atraumatic.  Eyes:     Pupils: Pupils are equal, round, and reactive to light.  Cardiovascular:     Rate and Rhythm: Normal rate and regular rhythm.  Pulmonary:     Effort: Pulmonary effort is normal. No respiratory distress.     Breath sounds: Normal breath sounds. No wheezing.  Neurological:     Mental Status: She is alert and oriented to person, place, and time.  Psychiatric:        Mood and Affect: Mood normal.        Behavior: Behavior normal.        Assessment/Plan: 1. Chronic obstructive pulmonary disease, unspecified COPD type (HCC) (Primary) Continue prn albuterol as prescribed. Nebulizer machine ordered to be used with her nebulized prescriptions. Continue supplemental oxygen use as instructed.  - For home use only DME Nebulizer machine - albuterol (VENTOLIN HFA) 108 (90 Base) MCG/ACT inhaler; INHALE 2 PUFFS BY MOUTH EVERY 6 HOURS AS NEEDED FOR WHEEZING OR SHORTNESS OF BREATH  Dispense: 3 each; Refill: 5  2. Chronic respiratory failure with hypoxia (HCC) New nebulizer machine  ordered to use with her nebulized medications. Continue supplemental oxygen use as instructed.  - For home use only DME Nebulizer machine  3. Type 2 diabetes mellitus with other specified complication, with long-term current use of insulin (HCC) Monitor carbohydrate and sugar intake, decrease intake of high carb foods.  Continue metformin as prescribed. Repeat A1c in 4 months, will adjust medications accordingly if no improvement.  - metFORMIN (GLUCOPHAGE-XR) 500 MG 24 hr tablet; Take 2 tablets (1,000 mg total) by mouth daily with breakfast.  Dispense: 180 tablet; Refill: 1  4. Hypertension associated with diabetes (HCC) Stable, continue metoprolol and lisinopril-hydrochlorothiazide as prescribed.  - metoprolol succinate (TOPROL-XL) 25 MG 24 hr tablet; Take 1 tablet (25 mg total) by mouth daily.  Dispense: 90 tablet; Refill: 3 - lisinopril-hydrochlorothiazide (ZESTORETIC) 20-25 MG tablet; Take 1 tablet by mouth daily.  Dispense: 90 tablet; Refill: 3  5. Chronic right shoulder pain Continue prn cyclobenzaprine as prescribed.  - cyclobenzaprine (FLEXERIL) 10 MG tablet; Take 1 tablet (10 mg total) by mouth at bedtime. Take one tab po qhs for back spasm prn only  Dispense: 30 tablet; Refill: 3  6. Encounter for medication review Medication list reviewed, updated and refills ordered - clopidogrel (PLAVIX) 75 MG tablet; Take 1 tablet (75 mg total) by mouth daily.  Dispense: 90 tablet; Refill: 3  7. History of lung cancer Noted, followed by oncology  8. Anxiety Continue prn alprazolam as prescribed. Follow up in 3 months for additional refills.  - ALPRAZolam (XANAX) 0.25 MG tablet; Take 1 tablet (0.25 mg total) by mouth 2 (two) times daily as needed for anxiety.  Dispense: 60 tablet; Refill: 2   General Counseling: Sameerah verbalizes understanding of the findings of todays visit and agrees with plan of treatment. I have discussed any further diagnostic evaluation that may be needed or ordered today. We also reviewed her medications today. she has been encouraged to call the office with any questions or concerns that should arise related to todays visit.    Orders Placed This Encounter  Procedures   For home use only DME Nebulizer machine    Meds ordered this encounter  Medications   metFORMIN  (GLUCOPHAGE-XR) 500 MG 24 hr tablet    Sig: Take 2 tablets (1,000 mg total) by mouth daily with breakfast.    Dispense:  180 tablet    Refill:  1    Fill new script today, discontinue previous orders for metformin.   metoprolol succinate (TOPROL-XL) 25 MG 24 hr tablet    Sig: Take 1 tablet (25 mg total) by mouth daily.    Dispense:  90 tablet    Refill:  3    Change to 90 day supply.   lisinopril-hydrochlorothiazide (ZESTORETIC) 20-25 MG tablet    Sig: Take 1 tablet by mouth daily.    Dispense:  90 tablet    Refill:  3    Fill script today, 90 day supply   cyclobenzaprine (FLEXERIL) 10 MG tablet    Sig: Take 1 tablet (10 mg total) by mouth at bedtime. Take one tab po qhs for back spasm prn only    Dispense:  30 tablet    Refill:  3   clopidogrel (PLAVIX) 75 MG tablet    Sig: Take 1 tablet (75 mg total) by mouth daily.    Dispense:  90 tablet    Refill:  3   ALPRAZolam (XANAX) 0.25 MG tablet    Sig: Take 1 tablet (0.25 mg total) by mouth 2 (two)  times daily as needed for anxiety.    Dispense:  60 tablet    Refill:  2    For future refills.   albuterol (VENTOLIN HFA) 108 (90 Base) MCG/ACT inhaler    Sig: INHALE 2 PUFFS BY MOUTH EVERY 6 HOURS AS NEEDED FOR WHEEZING OR SHORTNESS OF BREATH    Dispense:  3 each    Refill:  5    Please fill for 3 months at a time.    Return in about 3 months (around 01/04/2024) for F/U, anxiety med refill, Savanah Bayles PCP alprazolam.   Total time spent:30 Minutes Time spent includes review of chart, medications, test results, and follow up plan with the patient.   [place] Controlled Substance Database was reviewed by me.  This patient was seen by Sallyanne Kuster, FNP-C in collaboration with Dr. Beverely Risen as a part of collaborative care agreement.   Azya Barbero R. Tedd Sias, MSN, FNP-C Internal medicine

## 2023-10-13 NOTE — Assessment & Plan Note (Addendum)
 Lab Results  Component Value Date   HGB 12.7 10/08/2023   TIBC 417 10/08/2023   IRONPCTSAT 19 10/08/2023   FERRITIN 74 10/08/2023    Both hemoglobin and iron panel have normalized.  Hold off IV Venofer

## 2023-10-13 NOTE — Assessment & Plan Note (Signed)
Stable.  Continue oxygen supplementation.

## 2023-10-28 DIAGNOSIS — J9610 Chronic respiratory failure, unspecified whether with hypoxia or hypercapnia: Secondary | ICD-10-CM | POA: Diagnosis not present

## 2023-10-28 DIAGNOSIS — J449 Chronic obstructive pulmonary disease, unspecified: Secondary | ICD-10-CM | POA: Diagnosis not present

## 2023-11-04 DIAGNOSIS — R0602 Shortness of breath: Secondary | ICD-10-CM | POA: Diagnosis not present

## 2023-11-04 DIAGNOSIS — J449 Chronic obstructive pulmonary disease, unspecified: Secondary | ICD-10-CM | POA: Diagnosis not present

## 2023-11-04 DIAGNOSIS — I10 Essential (primary) hypertension: Secondary | ICD-10-CM | POA: Diagnosis not present

## 2023-11-09 ENCOUNTER — Other Ambulatory Visit: Payer: Self-pay | Admitting: Nurse Practitioner

## 2023-11-09 DIAGNOSIS — Z760 Encounter for issue of repeat prescription: Secondary | ICD-10-CM

## 2023-11-22 ENCOUNTER — Encounter: Payer: Self-pay | Admitting: Nurse Practitioner

## 2023-11-25 DIAGNOSIS — J441 Chronic obstructive pulmonary disease with (acute) exacerbation: Secondary | ICD-10-CM | POA: Diagnosis not present

## 2023-11-28 DIAGNOSIS — J9610 Chronic respiratory failure, unspecified whether with hypoxia or hypercapnia: Secondary | ICD-10-CM | POA: Diagnosis not present

## 2023-11-28 DIAGNOSIS — J449 Chronic obstructive pulmonary disease, unspecified: Secondary | ICD-10-CM | POA: Diagnosis not present

## 2023-11-29 ENCOUNTER — Other Ambulatory Visit (INDEPENDENT_AMBULATORY_CARE_PROVIDER_SITE_OTHER): Payer: Self-pay | Admitting: Vascular Surgery

## 2023-11-29 DIAGNOSIS — Z9889 Other specified postprocedural states: Secondary | ICD-10-CM

## 2023-12-01 ENCOUNTER — Ambulatory Visit (INDEPENDENT_AMBULATORY_CARE_PROVIDER_SITE_OTHER): Payer: 59 | Admitting: Nurse Practitioner

## 2023-12-01 ENCOUNTER — Ambulatory Visit (INDEPENDENT_AMBULATORY_CARE_PROVIDER_SITE_OTHER): Payer: 59

## 2023-12-01 ENCOUNTER — Encounter (INDEPENDENT_AMBULATORY_CARE_PROVIDER_SITE_OTHER): Payer: Self-pay | Admitting: Nurse Practitioner

## 2023-12-01 VITALS — BP 122/80 | HR 96 | Resp 16 | Ht 59.0 in | Wt 120.6 lb

## 2023-12-01 DIAGNOSIS — I70221 Atherosclerosis of native arteries of extremities with rest pain, right leg: Secondary | ICD-10-CM

## 2023-12-01 DIAGNOSIS — Z9889 Other specified postprocedural states: Secondary | ICD-10-CM

## 2023-12-01 DIAGNOSIS — I10 Essential (primary) hypertension: Secondary | ICD-10-CM | POA: Diagnosis not present

## 2023-12-01 DIAGNOSIS — I739 Peripheral vascular disease, unspecified: Secondary | ICD-10-CM

## 2023-12-01 DIAGNOSIS — Z794 Long term (current) use of insulin: Secondary | ICD-10-CM | POA: Diagnosis not present

## 2023-12-01 DIAGNOSIS — E0800 Diabetes mellitus due to underlying condition with hyperosmolarity without nonketotic hyperglycemic-hyperosmolar coma (NKHHC): Secondary | ICD-10-CM

## 2023-12-02 ENCOUNTER — Encounter (INDEPENDENT_AMBULATORY_CARE_PROVIDER_SITE_OTHER): Payer: Self-pay | Admitting: Nurse Practitioner

## 2023-12-02 NOTE — H&P (View-Only) (Signed)
 Subjective:    [name], female    DOB: May 08, 1956, 68 y.o.   MRN: [id number] Chief Complaint  Patient presents with   Follow-up    3 month abi and le arterial duplex    The patient returns today for noninvasive studies regarding her peripheral arterial disease.  She has had multiple interventions on her left lower extremity.  Her most recent intervention was on 07/29/2023.  She notes that she has numbness in her bilateral lower extremities.  While the left foot is numb is not painful like the right foot is.  She notes over the last month or so she has had episodes that her foot becomes very painful and aching and uncomfortable and it requires her to get out of bed in order to find some relief.  She does not walk significantly due to her dependency with oxygen and says she has no claudication symptoms.  She has not developed any open wounds or ulcerations.  Today noninvasive studies show an ABI of 0.73 on the right and 0.86 on the left.  Left arterial duplex shows monophasic waveforms throughout.  Toe waveforms on the right appear to be more consistent with monophasic waveforms and dampened toe waveforms.    Review of Systems  Cardiovascular:        Rest pain  Neurological:  Positive for numbness.  All other systems reviewed and are negative.      Objective:   Physical Exam Vitals reviewed.  HENT:     Head: Normocephalic.  Cardiovascular:     Rate and Rhythm: Normal rate.     Pulses:          Dorsalis pedis pulses are detected w/ Doppler on the right side and detected w/ Doppler on the left side.       Posterior tibial pulses are detected w/ Doppler on the right side and detected w/ Doppler on the left side.  Pulmonary:     Effort: Pulmonary effort is normal.  Skin:    General: Skin is warm and dry.  Neurological:     Mental Status: She is alert and oriented to person, place, and time.  Psychiatric:        Mood and Affect: Mood normal.        Behavior:  Behavior normal.        Thought Content: Thought content normal.        Judgment: Judgment normal.     BP 122/80   Pulse 96   Resp 16   Ht 4\' 11"  (1.499 m)   Wt 120 lb 9.6 oz (54.7 kg)   BMI 24.36 kg/m   Past Medical History:  Diagnosis Date   Anemia    Arthritis    Asthma    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/2016   Cancer (HCC) 2012   Right Lung CA   COPD (chronic obstructive pulmonary disease) (HCC)    Depression    Diabetes mellitus without complication (HCC)    Patient takes Janumet   Essential hypertension 05/26/2016   Heart failure (HCC) 2022   Hydropneumothorax 05/03/2020   Hypercholesteremia    Oxygen dependent    2L at nite    PAD (peripheral artery disease) (HCC) 06/22/2016   Peripheral vascular disease (HCC)    Personal history of radiation therapy    Shortness of breath dyspnea    with exertion    Sialolithiasis    Sleep apnea    Wears  dentures    full upper and lower    Social History   Socioeconomic History   Marital status: Widowed    Spouse name: Not on file   Number of children: Not on file   Years of education: Not on file   Highest education level: Not on file  Occupational History   Not on file  Tobacco Use   Smoking status: Former    Current packs/day: 0.00    Average packs/day: 1 pack/day for 37.0 years (37.0 ttl pk-yrs)    Types: Cigarettes    Start date: 02/06/1973    Quit date: 02/06/2010    Years since quitting: 13.8   Smokeless tobacco: Former    Types: Snuff  Vaping Use   Vaping status: Never Used  Substance and Sexual Activity   Alcohol use: Not Currently    Alcohol/week: 5.0 standard drinks of alcohol    Types: 5 Cans of beer per week    Comment: /h x of alcohol abuse -stopped 2012- now drinks 5 beer per week   Drug use: Not Currently    Types: Marijuana, "Crack" cocaine, Cocaine    Comment: hx of cocaine use- last use 2015; last use marijuana6/22/19,   Sexual  activity: Yes  Other Topics Concern   Not on file  Social History Narrative   Lives with Significant Other x 43 years   Social Drivers of Corporate investment banker Strain: Not on file  Food Insecurity: No Food Insecurity (06/15/2023)   Hunger Vital Sign    Worried About Running Out of Food in the Last Year: Never true    Ran Out of Food in the Last Year: Never true  Transportation Needs: No Transportation Needs (06/15/2023)   PRAPARE - Administrator, Civil Service (Medical): No    Lack of Transportation (Non-Medical): No  Physical Activity: Not on file  Stress: Not on file  Social Connections: Not on file  Intimate Partner Violence: Not At Risk (06/15/2023)   Humiliation, Afraid, Rape, and Kick questionnaire    Fear of Current or Ex-Partner: No    Emotionally Abused: No    Physically Abused: No    Sexually Abused: No    Past Surgical History:  Procedure Laterality Date   CESAREAN SECTION     x3   COLONOSCOPY WITH PROPOFOL N/A 06/25/2015   Procedure: COLONOSCOPY WITH PROPOFOL;  Surgeon: Marnee Sink, MD;  Location: [facility];  Service: Endoscopy;  Laterality: N/A;   COLONOSCOPY WITH PROPOFOL N/A 07/26/2020   Procedure: COLONOSCOPY WITH PROPOFOL;  Surgeon: Marnee Sink, MD;  Location: [facility];  Service: Endoscopy;  Laterality: N/A;   CYST REMOVAL LEG     and on shoulder    ESOPHAGOGASTRODUODENOSCOPY (EGD) WITH PROPOFOL N/A 07/26/2020   Procedure: ESOPHAGOGASTRODUODENOSCOPY (EGD) WITH PROPOFOL;  Surgeon: Marnee Sink, MD;  Location: [facility];  Service: Endoscopy;  Laterality: N/A;  Diabetic - oral meds   LOWER EXTREMITY ANGIOGRAPHY Left 09/29/2018   Procedure: LOWER EXTREMITY ANGIOGRAPHY;  Surgeon: Celso College, MD;  Location: [facility];  Service: Cardiovascular;  Laterality: Left;   LOWER EXTREMITY ANGIOGRAPHY Left 07/29/2023   Procedure: Lower Extremity Angiography;  Surgeon: Celso College, MD;  Location: [facility];  Service: Cardiovascular;  Laterality: Left;   LUNG BIOPSY  12/30/2011   has lung "spots"   PACEMAKER IMPLANT  07/14/2021   PACEMAKER LEADLESS INSERTION N/A 07/14/2021   Procedure: PACEMAKER LEADLESS INSERTION;  Surgeon: Percival Brace, MD;  Location: [facility];  Service: Cardiovascular;  Laterality: N/A;   PERIPHERAL VASCULAR CATHETERIZATION Left 06/01/2016   Procedure: Lower Extremity Angiography;  Surgeon: Celso College, MD;  Location: [facility];  Service: Cardiovascular;  Laterality: Left;   PERIPHERAL VASCULAR CATHETERIZATION N/A 06/01/2016   Procedure: Abdominal Aortogram w/Lower Extremity;  Surgeon: Celso College, MD;  Location: [facility];  Service: Cardiovascular;  Laterality: N/A;   PERIPHERAL VASCULAR CATHETERIZATION  06/01/2016   Procedure: Lower Extremity Intervention;  Surgeon: Celso College, MD;  Location: [facility];  Service: Cardiovascular;;   PERIPHERAL VASCULAR CATHETERIZATION Right 06/08/2016   Procedure: Lower Extremity Angiography;  Surgeon: Celso College, MD;  Location: [facility];  Service: Cardiovascular;  Laterality: Right;   PERIPHERAL VASCULAR CATHETERIZATION  06/08/2016   Procedure: Lower Extremity Intervention;  Surgeon: Celso College, MD;  Location: [facility];  Service: Cardiovascular;;   SUBMANDIBULAR GLAND EXCISION Left 12/06/2020   Procedure: EXCISION SUBMANDIBULAR GLAND;  Surgeon: Lesly Raspberry, MD;  Location: [facility];  Service: ENT;  Laterality: Left;  needs to be first case Diabetic - diet controlled   TEMPORARY PACEMAKER N/A 07/11/2021   Procedure: TEMPORARY PACEMAKER;  Surgeon: Percival Brace, MD;  Location: [facility];  Service: Cardiovascular;  Laterality: N/A;    Family History  Problem Relation Age of Onset   Diabetes Mother    Hypercholesterolemia Mother    Lung cancer Father    Diabetes Sister    Diabetes Sister    Hypertension Sister    Diabetes  Maternal Grandmother    Diabetes Paternal Grandmother    Heart attack Brother    Coronary artery disease Brother    Vascular Disease Brother    Heart attack Brother    Breast cancer Neg Hx     Allergies  Allergen Reactions   Trelegy Ellipta [Fluticasone-Umeclidin-Vilant]     Had breathing issues       Latest Ref Rng & Units 10/08/2023   10:04 AM 10/04/2023   10:47 AM 07/06/2023   10:17 AM  CBC  WBC 4.0 - 10.5 K/uL 5.4  4.7  4.8   Hemoglobin 12.0 - 15.0 g/dL 01.6  01.0  9.4   Hematocrit 36.0 - 46.0 % 42.5  37.6  31.9   Platelets 150 - 400 K/uL 264  236  282       CMP     Component Value Date/Time   NA 144 10/04/2023 1047   NA 141 04/17/2014 1055   K 4.2 10/04/2023 1047   K 3.9 04/17/2014 1055   CL 99 10/04/2023 1047   CL 102 04/17/2014 1055   CO2 27 10/04/2023 1047   CO2 30 04/17/2014 1055   GLUCOSE 173 (H) 10/04/2023 1047   GLUCOSE 215 (H) 07/06/2023 1016   GLUCOSE 201 (H) 04/17/2014 1055   BUN 12 10/04/2023 1047   BUN 10 04/17/2014 1055   CREATININE 0.63 10/04/2023 1047   CREATININE 0.84 07/06/2023 1016   CREATININE 0.90 04/17/2014 1055   CALCIUM 9.4 10/04/2023 1047   CALCIUM 9.1 04/17/2014 1055   PROT 6.9 10/04/2023 1047   PROT 8.1 04/17/2014 1055   ALBUMIN 4.4 10/04/2023 1047   ALBUMIN 4.1 04/17/2014 1055   AST 22 10/04/2023 1047   AST 24 07/06/2023 1016   ALT 19 10/04/2023 1047   ALT 27 07/06/2023 1016   ALT 29 04/17/2014 1055   ALKPHOS 96 10/04/2023 1047   ALKPHOS 73 04/17/2014 1055  BILITOT 0.2 10/04/2023 1047   BILITOT 0.4 07/06/2023 1016   EGFR 97 10/04/2023 1047   GFRNONAA >60 07/29/2023 1112   GFRNONAA >60 07/06/2023 1016   GFRNONAA >60 04/17/2014 1055     No results found.     Assessment & Plan:   1. Atherosclerosis of native artery of right lower extremity with rest pain (HCC) (Primary) Recommend:  The patient has evidence of severe atherosclerotic changes of both lower extremities with rest pain that is associated with  preulcerative changes and impending tissue loss of the right foot.  This represents a limb threatening ischemia and places the patient at the risk for  limb loss.  Patient should undergo angiography of the right lower extremity with the hope for intervention for limb salvage.  The risks and benefits as well as the alternative therapies was discussed in detail with the patient.  All questions were answered.  Patient agrees to proceed with  lower extremity angiography.  The patient will follow up with me in the office after the procedure.      2. Essential hypertension Continue antihypertensive medications as already ordered, these medications have been reviewed and there are no changes at this time.  3. Diabetes mellitus due to underlying condition with hyperosmolarity without coma, with long-term current use of insulin (HCC) Continue hypoglycemic medications as already ordered, these medications have been reviewed and there are no changes at this time.  Hgb A1C to be monitored as already arranged by primary service   Current Outpatient Medications on File Prior to Visit  Medication Sig Dispense Refill   ACCU-CHEK GUIDE TEST test strip USE TO TEST BLOOD SUGAR 5 TIMES DAILY 450 each 0   Accu-Chek Softclix Lancets lancets Use 1 lancet 3 times daily to check glucose for diabetes 300 each 1   albuterol (VENTOLIN HFA) 108 (90 Base) MCG/ACT inhaler INHALE 2 PUFFS BY MOUTH EVERY 6 HOURS AS NEEDED FOR WHEEZING OR SHORTNESS OF BREATH 3 each 5   ALPRAZolam (XANAX) 0.25 MG tablet Take 1 tablet (0.25 mg total) by mouth 2 (two) times daily as needed for anxiety. 60 tablet 2   aspirin EC 81 MG tablet Take 1 tablet (81 mg total) by mouth daily. Swallow whole. 150 tablet 2   atorvastatin (LIPITOR) 10 MG tablet Take 1 tablet (10 mg total) by mouth daily. 30 tablet 11   Blood Glucose Monitoring Suppl (ACCU-CHEK GUIDE) w/Device KIT Use as directed Dx e11.65 1 kit 0   calcium-vitamin D (OSCAL WITH D) 500-5  MG-MCG tablet Take 1 tablet by mouth 2 (two) times daily. 100 tablet 0   clopidogrel (PLAVIX) 75 MG tablet Take 1 tablet (75 mg total) by mouth daily. 90 tablet 3   Continuous Blood Gluc Receiver (DEXCOM G7 RECEIVER) DEVI Use one as directed for uncontrolled dm 1 each 0   Continuous Glucose Sensor (DEXCOM G7 SENSOR) MISC USE AS DIRECTED. CHANGE EVERY 10 DAYS. 3 each 0   cyclobenzaprine (FLEXERIL) 10 MG tablet Take 1 tablet (10 mg total) by mouth at bedtime. Take one tab po qhs for back spasm prn only 30 tablet 3   Ensifentrine (OHTUVAYRE) 3 MG/2.5ML SUSP Inhale 3 mg into the lungs in the morning and at bedtime. 150 mL 11   escitalopram (LEXAPRO) 10 MG tablet Take 1 tablet (10 mg total) by mouth daily. 90 tablet 1   ferrous sulfate 325 (65 FE) MG tablet Take 325 mg by mouth 2 (two) times daily with a meal.     furosemide (  LASIX) 20 MG tablet Take 1 tablet (20 mg total) by mouth daily as needed. 30 tablet 5   gabapentin (NEURONTIN) 300 MG capsule TAKE 1 CAPSULE BY MOUTH THREE TIMES DAILY 90 capsule 0   insulin glargine, 1 Unit Dial, (TOUJEO) 300 UNIT/ML Solostar Pen Inject 10 Units into the skin daily. 1.5 mL 11   Insulin Pen Needle (RELION PEN NEEDLES) 31G X 6 MM MISC USE 1 PEN NEEDLE WITH INSULIN PEN ONCE DAILY FOR DIABETES 50 each 3   ipratropium-albuterol (DUONEB) 0.5-2.5 (3) MG/3ML SOLN Take 3 mLs by nebulization every 4 (four) hours as needed. 360 mL 3   lisinopril-hydrochlorothiazide (ZESTORETIC) 20-25 MG tablet Take 1 tablet by mouth daily. 90 tablet 3   metFORMIN (GLUCOPHAGE-XR) 500 MG 24 hr tablet Take 2 tablets (1,000 mg total) by mouth daily with breakfast. 180 tablet 1   metoprolol succinate (TOPROL-XL) 25 MG 24 hr tablet Take 1 tablet (25 mg total) by mouth daily. 90 tablet 3   mometasone-formoterol (DULERA) 200-5 MCG/ACT AERO Inhale 2 puffs by mouth twice daily 13 g 12   OXYGEN Inhale 4 L into the lungs. PT USES [facility] FOR OXYGEN     potassium chloride (KLOR-CON) 10 MEQ tablet  Take 1 tablet (10 mEq total) by mouth every other day. 45 tablet 2   SPIRIVA HANDIHALER 18 MCG inhalation capsule INHALE THE CONTENTS OF 1 CAPSULES BY MOUTH ONCE DAILY - DO NOT SWALLOW CAPSULES 90 capsule 3   vitamin B-12 (CYANOCOBALAMIN) 100 MCG tablet Take 100 mcg by mouth daily.     VITAMIN D, CHOLECALCIFEROL, PO Take 1 tablet by mouth daily with supper.     famotidine (PEPCID) 20 MG tablet Take 1 tablet (20 mg total) by mouth daily for 14 days. (Patient taking differently: Take 20 mg by mouth daily as needed for heartburn.) 14 tablet 0   No current facility-administered medications on file prior to visit.    There are no Patient Instructions on file for this visit. No follow-ups on file.   Cheston Coury E Rosabelle Jupin, NP

## 2023-12-02 NOTE — Progress Notes (Signed)
 Subjective:    [name], female    DOB: May 08, 1956, 68 y.o.   MRN: [id number] Chief Complaint  Patient presents with   Follow-up    3 month abi and le arterial duplex    The patient returns today for noninvasive studies regarding her peripheral arterial disease.  She has had multiple interventions on her left lower extremity.  Her most recent intervention was on 07/29/2023.  She notes that she has numbness in her bilateral lower extremities.  While the left foot is numb is not painful like the right foot is.  She notes over the last month or so she has had episodes that her foot becomes very painful and aching and uncomfortable and it requires her to get out of bed in order to find some relief.  She does not walk significantly due to her dependency with oxygen and says she has no claudication symptoms.  She has not developed any open wounds or ulcerations.  Today noninvasive studies show an ABI of 0.73 on the right and 0.86 on the left.  Left arterial duplex shows monophasic waveforms throughout.  Toe waveforms on the right appear to be more consistent with monophasic waveforms and dampened toe waveforms.    Review of Systems  Cardiovascular:        Rest pain  Neurological:  Positive for numbness.  All other systems reviewed and are negative.      Objective:   Physical Exam Vitals reviewed.  HENT:     Head: Normocephalic.  Cardiovascular:     Rate and Rhythm: Normal rate.     Pulses:          Dorsalis pedis pulses are detected w/ Doppler on the right side and detected w/ Doppler on the left side.       Posterior tibial pulses are detected w/ Doppler on the right side and detected w/ Doppler on the left side.  Pulmonary:     Effort: Pulmonary effort is normal.  Skin:    General: Skin is warm and dry.  Neurological:     Mental Status: She is alert and oriented to person, place, and time.  Psychiatric:        Mood and Affect: Mood normal.        Behavior:  Behavior normal.        Thought Content: Thought content normal.        Judgment: Judgment normal.     BP 122/80   Pulse 96   Resp 16   Ht 4\' 11"  (1.499 m)   Wt 120 lb 9.6 oz (54.7 kg)   BMI 24.36 kg/m   Past Medical History:  Diagnosis Date   Anemia    Arthritis    Asthma    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/2016   Cancer (HCC) 2012   Right Lung CA   COPD (chronic obstructive pulmonary disease) (HCC)    Depression    Diabetes mellitus without complication (HCC)    Patient takes Janumet   Essential hypertension 05/26/2016   Heart failure (HCC) 2022   Hydropneumothorax 05/03/2020   Hypercholesteremia    Oxygen dependent    2L at nite    PAD (peripheral artery disease) (HCC) 06/22/2016   Peripheral vascular disease (HCC)    Personal history of radiation therapy    Shortness of breath dyspnea    with exertion    Sialolithiasis    Sleep apnea    Wears  dentures    full upper and lower    Social History   Socioeconomic History   Marital status: Widowed    Spouse name: Not on file   Number of children: Not on file   Years of education: Not on file   Highest education level: Not on file  Occupational History   Not on file  Tobacco Use   Smoking status: Former    Current packs/day: 0.00    Average packs/day: 1 pack/day for 37.0 years (37.0 ttl pk-yrs)    Types: Cigarettes    Start date: 02/06/1973    Quit date: 02/06/2010    Years since quitting: 13.8   Smokeless tobacco: Former    Types: Snuff  Vaping Use   Vaping status: Never Used  Substance and Sexual Activity   Alcohol use: Not Currently    Alcohol/week: 5.0 standard drinks of alcohol    Types: 5 Cans of beer per week    Comment: /h x of alcohol abuse -stopped 2012- now drinks 5 beer per week   Drug use: Not Currently    Types: Marijuana, "Crack" cocaine, Cocaine    Comment: hx of cocaine use- last use 2015; last use marijuana6/22/19,   Sexual  activity: Yes  Other Topics Concern   Not on file  Social History Narrative   Lives with Significant Other x 43 years   Social Drivers of Corporate investment banker Strain: Not on file  Food Insecurity: No Food Insecurity (06/15/2023)   Hunger Vital Sign    Worried About Running Out of Food in the Last Year: Never true    Ran Out of Food in the Last Year: Never true  Transportation Needs: No Transportation Needs (06/15/2023)   PRAPARE - Administrator, Civil Service (Medical): No    Lack of Transportation (Non-Medical): No  Physical Activity: Not on file  Stress: Not on file  Social Connections: Not on file  Intimate Partner Violence: Not At Risk (06/15/2023)   Humiliation, Afraid, Rape, and Kick questionnaire    Fear of Current or Ex-Partner: No    Emotionally Abused: No    Physically Abused: No    Sexually Abused: No    Past Surgical History:  Procedure Laterality Date   CESAREAN SECTION     x3   COLONOSCOPY WITH PROPOFOL N/A 06/25/2015   Procedure: COLONOSCOPY WITH PROPOFOL;  Surgeon: Marnee Sink, MD;  Location: [facility];  Service: Endoscopy;  Laterality: N/A;   COLONOSCOPY WITH PROPOFOL N/A 07/26/2020   Procedure: COLONOSCOPY WITH PROPOFOL;  Surgeon: Marnee Sink, MD;  Location: [facility];  Service: Endoscopy;  Laterality: N/A;   CYST REMOVAL LEG     and on shoulder    ESOPHAGOGASTRODUODENOSCOPY (EGD) WITH PROPOFOL N/A 07/26/2020   Procedure: ESOPHAGOGASTRODUODENOSCOPY (EGD) WITH PROPOFOL;  Surgeon: Marnee Sink, MD;  Location: [facility];  Service: Endoscopy;  Laterality: N/A;  Diabetic - oral meds   LOWER EXTREMITY ANGIOGRAPHY Left 09/29/2018   Procedure: LOWER EXTREMITY ANGIOGRAPHY;  Surgeon: Celso College, MD;  Location: [facility];  Service: Cardiovascular;  Laterality: Left;   LOWER EXTREMITY ANGIOGRAPHY Left 07/29/2023   Procedure: Lower Extremity Angiography;  Surgeon: Celso College, MD;  Location: [facility];  Service: Cardiovascular;  Laterality: Left;   LUNG BIOPSY  12/30/2011   has lung "spots"   PACEMAKER IMPLANT  07/14/2021   PACEMAKER LEADLESS INSERTION N/A 07/14/2021   Procedure: PACEMAKER LEADLESS INSERTION;  Surgeon: Percival Brace, MD;  Location: [facility];  Service: Cardiovascular;  Laterality: N/A;   PERIPHERAL VASCULAR CATHETERIZATION Left 06/01/2016   Procedure: Lower Extremity Angiography;  Surgeon: Celso College, MD;  Location: [facility];  Service: Cardiovascular;  Laterality: Left;   PERIPHERAL VASCULAR CATHETERIZATION N/A 06/01/2016   Procedure: Abdominal Aortogram w/Lower Extremity;  Surgeon: Celso College, MD;  Location: [facility];  Service: Cardiovascular;  Laterality: N/A;   PERIPHERAL VASCULAR CATHETERIZATION  06/01/2016   Procedure: Lower Extremity Intervention;  Surgeon: Celso College, MD;  Location: [facility];  Service: Cardiovascular;;   PERIPHERAL VASCULAR CATHETERIZATION Right 06/08/2016   Procedure: Lower Extremity Angiography;  Surgeon: Celso College, MD;  Location: [facility];  Service: Cardiovascular;  Laterality: Right;   PERIPHERAL VASCULAR CATHETERIZATION  06/08/2016   Procedure: Lower Extremity Intervention;  Surgeon: Celso College, MD;  Location: [facility];  Service: Cardiovascular;;   SUBMANDIBULAR GLAND EXCISION Left 12/06/2020   Procedure: EXCISION SUBMANDIBULAR GLAND;  Surgeon: Lesly Raspberry, MD;  Location: [facility];  Service: ENT;  Laterality: Left;  needs to be first case Diabetic - diet controlled   TEMPORARY PACEMAKER N/A 07/11/2021   Procedure: TEMPORARY PACEMAKER;  Surgeon: Percival Brace, MD;  Location: [facility];  Service: Cardiovascular;  Laterality: N/A;    Family History  Problem Relation Age of Onset   Diabetes Mother    Hypercholesterolemia Mother    Lung cancer Father    Diabetes Sister    Diabetes Sister    Hypertension Sister    Diabetes  Maternal Grandmother    Diabetes Paternal Grandmother    Heart attack Brother    Coronary artery disease Brother    Vascular Disease Brother    Heart attack Brother    Breast cancer Neg Hx     Allergies  Allergen Reactions   Trelegy Ellipta [Fluticasone-Umeclidin-Vilant]     Had breathing issues       Latest Ref Rng & Units 10/08/2023   10:04 AM 10/04/2023   10:47 AM 07/06/2023   10:17 AM  CBC  WBC 4.0 - 10.5 K/uL 5.4  4.7  4.8   Hemoglobin 12.0 - 15.0 g/dL 01.6  01.0  9.4   Hematocrit 36.0 - 46.0 % 42.5  37.6  31.9   Platelets 150 - 400 K/uL 264  236  282       CMP     Component Value Date/Time   NA 144 10/04/2023 1047   NA 141 04/17/2014 1055   K 4.2 10/04/2023 1047   K 3.9 04/17/2014 1055   CL 99 10/04/2023 1047   CL 102 04/17/2014 1055   CO2 27 10/04/2023 1047   CO2 30 04/17/2014 1055   GLUCOSE 173 (H) 10/04/2023 1047   GLUCOSE 215 (H) 07/06/2023 1016   GLUCOSE 201 (H) 04/17/2014 1055   BUN 12 10/04/2023 1047   BUN 10 04/17/2014 1055   CREATININE 0.63 10/04/2023 1047   CREATININE 0.84 07/06/2023 1016   CREATININE 0.90 04/17/2014 1055   CALCIUM 9.4 10/04/2023 1047   CALCIUM 9.1 04/17/2014 1055   PROT 6.9 10/04/2023 1047   PROT 8.1 04/17/2014 1055   ALBUMIN 4.4 10/04/2023 1047   ALBUMIN 4.1 04/17/2014 1055   AST 22 10/04/2023 1047   AST 24 07/06/2023 1016   ALT 19 10/04/2023 1047   ALT 27 07/06/2023 1016   ALT 29 04/17/2014 1055   ALKPHOS 96 10/04/2023 1047   ALKPHOS 73 04/17/2014 1055  BILITOT 0.2 10/04/2023 1047   BILITOT 0.4 07/06/2023 1016   EGFR 97 10/04/2023 1047   GFRNONAA >60 07/29/2023 1112   GFRNONAA >60 07/06/2023 1016   GFRNONAA >60 04/17/2014 1055     No results found.     Assessment & Plan:   1. Atherosclerosis of native artery of right lower extremity with rest pain (HCC) (Primary) Recommend:  The patient has evidence of severe atherosclerotic changes of both lower extremities with rest pain that is associated with  preulcerative changes and impending tissue loss of the right foot.  This represents a limb threatening ischemia and places the patient at the risk for  limb loss.  Patient should undergo angiography of the right lower extremity with the hope for intervention for limb salvage.  The risks and benefits as well as the alternative therapies was discussed in detail with the patient.  All questions were answered.  Patient agrees to proceed with  lower extremity angiography.  The patient will follow up with me in the office after the procedure.      2. Essential hypertension Continue antihypertensive medications as already ordered, these medications have been reviewed and there are no changes at this time.  3. Diabetes mellitus due to underlying condition with hyperosmolarity without coma, with long-term current use of insulin (HCC) Continue hypoglycemic medications as already ordered, these medications have been reviewed and there are no changes at this time.  Hgb A1C to be monitored as already arranged by primary service   Current Outpatient Medications on File Prior to Visit  Medication Sig Dispense Refill   ACCU-CHEK GUIDE TEST test strip USE TO TEST BLOOD SUGAR 5 TIMES DAILY 450 each 0   Accu-Chek Softclix Lancets lancets Use 1 lancet 3 times daily to check glucose for diabetes 300 each 1   albuterol (VENTOLIN HFA) 108 (90 Base) MCG/ACT inhaler INHALE 2 PUFFS BY MOUTH EVERY 6 HOURS AS NEEDED FOR WHEEZING OR SHORTNESS OF BREATH 3 each 5   ALPRAZolam (XANAX) 0.25 MG tablet Take 1 tablet (0.25 mg total) by mouth 2 (two) times daily as needed for anxiety. 60 tablet 2   aspirin EC 81 MG tablet Take 1 tablet (81 mg total) by mouth daily. Swallow whole. 150 tablet 2   atorvastatin (LIPITOR) 10 MG tablet Take 1 tablet (10 mg total) by mouth daily. 30 tablet 11   Blood Glucose Monitoring Suppl (ACCU-CHEK GUIDE) w/Device KIT Use as directed Dx e11.65 1 kit 0   calcium-vitamin D (OSCAL WITH D) 500-5  MG-MCG tablet Take 1 tablet by mouth 2 (two) times daily. 100 tablet 0   clopidogrel (PLAVIX) 75 MG tablet Take 1 tablet (75 mg total) by mouth daily. 90 tablet 3   Continuous Blood Gluc Receiver (DEXCOM G7 RECEIVER) DEVI Use one as directed for uncontrolled dm 1 each 0   Continuous Glucose Sensor (DEXCOM G7 SENSOR) MISC USE AS DIRECTED. CHANGE EVERY 10 DAYS. 3 each 0   cyclobenzaprine (FLEXERIL) 10 MG tablet Take 1 tablet (10 mg total) by mouth at bedtime. Take one tab po qhs for back spasm prn only 30 tablet 3   Ensifentrine (OHTUVAYRE) 3 MG/2.5ML SUSP Inhale 3 mg into the lungs in the morning and at bedtime. 150 mL 11   escitalopram (LEXAPRO) 10 MG tablet Take 1 tablet (10 mg total) by mouth daily. 90 tablet 1   ferrous sulfate 325 (65 FE) MG tablet Take 325 mg by mouth 2 (two) times daily with a meal.     furosemide (  LASIX) 20 MG tablet Take 1 tablet (20 mg total) by mouth daily as needed. 30 tablet 5   gabapentin (NEURONTIN) 300 MG capsule TAKE 1 CAPSULE BY MOUTH THREE TIMES DAILY 90 capsule 0   insulin glargine, 1 Unit Dial, (TOUJEO) 300 UNIT/ML Solostar Pen Inject 10 Units into the skin daily. 1.5 mL 11   Insulin Pen Needle (RELION PEN NEEDLES) 31G X 6 MM MISC USE 1 PEN NEEDLE WITH INSULIN PEN ONCE DAILY FOR DIABETES 50 each 3   ipratropium-albuterol (DUONEB) 0.5-2.5 (3) MG/3ML SOLN Take 3 mLs by nebulization every 4 (four) hours as needed. 360 mL 3   lisinopril-hydrochlorothiazide (ZESTORETIC) 20-25 MG tablet Take 1 tablet by mouth daily. 90 tablet 3   metFORMIN (GLUCOPHAGE-XR) 500 MG 24 hr tablet Take 2 tablets (1,000 mg total) by mouth daily with breakfast. 180 tablet 1   metoprolol succinate (TOPROL-XL) 25 MG 24 hr tablet Take 1 tablet (25 mg total) by mouth daily. 90 tablet 3   mometasone-formoterol (DULERA) 200-5 MCG/ACT AERO Inhale 2 puffs by mouth twice daily 13 g 12   OXYGEN Inhale 4 L into the lungs. PT USES [employer] FOR OXYGEN     potassium chloride (KLOR-CON) 10 MEQ tablet  Take 1 tablet (10 mEq total) by mouth every other day. 45 tablet 2   SPIRIVA HANDIHALER 18 MCG inhalation capsule INHALE THE CONTENTS OF 1 CAPSULES BY MOUTH ONCE DAILY - DO NOT SWALLOW CAPSULES 90 capsule 3   vitamin B-12 (CYANOCOBALAMIN) 100 MCG tablet Take 100 mcg by mouth daily.     VITAMIN D, CHOLECALCIFEROL, PO Take 1 tablet by mouth daily with supper.     famotidine (PEPCID) 20 MG tablet Take 1 tablet (20 mg total) by mouth daily for 14 days. (Patient taking differently: Take 20 mg by mouth daily as needed for heartburn.) 14 tablet 0   No current facility-administered medications on file prior to visit.    There are no Patient Instructions on file for this visit. No follow-ups on file.   Cheston Coury E Rosabelle Jupin, NP

## 2023-12-05 DIAGNOSIS — I10 Essential (primary) hypertension: Secondary | ICD-10-CM | POA: Diagnosis not present

## 2023-12-05 DIAGNOSIS — R0602 Shortness of breath: Secondary | ICD-10-CM | POA: Diagnosis not present

## 2023-12-05 DIAGNOSIS — J449 Chronic obstructive pulmonary disease, unspecified: Secondary | ICD-10-CM | POA: Diagnosis not present

## 2023-12-06 ENCOUNTER — Telehealth (INDEPENDENT_AMBULATORY_CARE_PROVIDER_SITE_OTHER): Payer: Self-pay

## 2023-12-06 LAB — VAS US ABI WITH/WO TBI
Left ABI: 0.86
Right ABI: 0.73

## 2023-12-06 NOTE — Telephone Encounter (Signed)
 I attempted to contact the patient and was unable to leave a message as the voicemail box was not set up. I then called the patient's sister and left information for the patient to contact.

## 2023-12-07 DIAGNOSIS — Z950 Presence of cardiac pacemaker: Secondary | ICD-10-CM | POA: Diagnosis not present

## 2023-12-07 DIAGNOSIS — I442 Atrioventricular block, complete: Secondary | ICD-10-CM | POA: Diagnosis not present

## 2023-12-07 DIAGNOSIS — I495 Sick sinus syndrome: Secondary | ICD-10-CM | POA: Diagnosis not present

## 2023-12-07 NOTE — Telephone Encounter (Signed)
 Patient returned my call and is scheduled with Dr. Vonna Guardian on 12/20/23 for a RLE angio with a 8:30 am arrival time to the [facility]. Pre-procedure instructions were discussed and will be mailed.

## 2023-12-11 ENCOUNTER — Other Ambulatory Visit: Payer: Self-pay | Admitting: Nurse Practitioner

## 2023-12-11 DIAGNOSIS — Z79899 Other long term (current) drug therapy: Secondary | ICD-10-CM

## 2023-12-15 ENCOUNTER — Other Ambulatory Visit: Payer: Self-pay | Admitting: Nurse Practitioner

## 2023-12-15 DIAGNOSIS — Z760 Encounter for issue of repeat prescription: Secondary | ICD-10-CM

## 2023-12-18 ENCOUNTER — Other Ambulatory Visit: Payer: Self-pay | Admitting: Nurse Practitioner

## 2023-12-18 DIAGNOSIS — Z760 Encounter for issue of repeat prescription: Secondary | ICD-10-CM

## 2023-12-20 ENCOUNTER — Ambulatory Visit
Admission: RE | Admit: 2023-12-20 | Discharge: 2023-12-20 | Disposition: A | Discharge status: Home / Self Care | Attending: Vascular Surgery | Admitting: Vascular Surgery

## 2023-12-20 ENCOUNTER — Encounter: Payer: Self-pay | Admitting: Vascular Surgery

## 2023-12-20 ENCOUNTER — Other Ambulatory Visit: Payer: Self-pay

## 2023-12-20 ENCOUNTER — Encounter
Admission: RE | Disposition: A | Discharge status: Home / Self Care | Payer: Self-pay | Source: Home / Self Care | Attending: Vascular Surgery

## 2023-12-20 DIAGNOSIS — I70229 Atherosclerosis of native arteries of extremities with rest pain, unspecified extremity: Secondary | ICD-10-CM

## 2023-12-20 DIAGNOSIS — Z79899 Other long term (current) drug therapy: Secondary | ICD-10-CM | POA: Insufficient documentation

## 2023-12-20 DIAGNOSIS — Z794 Long term (current) use of insulin: Secondary | ICD-10-CM | POA: Insufficient documentation

## 2023-12-20 DIAGNOSIS — Z9889 Other specified postprocedural states: Secondary | ICD-10-CM | POA: Diagnosis not present

## 2023-12-20 DIAGNOSIS — I70221 Atherosclerosis of native arteries of extremities with rest pain, right leg: Secondary | ICD-10-CM | POA: Insufficient documentation

## 2023-12-20 DIAGNOSIS — Z87891 Personal history of nicotine dependence: Secondary | ICD-10-CM | POA: Insufficient documentation

## 2023-12-20 DIAGNOSIS — Z7984 Long term (current) use of oral hypoglycemic drugs: Secondary | ICD-10-CM | POA: Insufficient documentation

## 2023-12-20 DIAGNOSIS — E1151 Type 2 diabetes mellitus with diabetic peripheral angiopathy without gangrene: Secondary | ICD-10-CM | POA: Diagnosis not present

## 2023-12-20 DIAGNOSIS — I509 Heart failure, unspecified: Secondary | ICD-10-CM | POA: Insufficient documentation

## 2023-12-20 DIAGNOSIS — I110 Hypertensive heart disease with heart failure: Secondary | ICD-10-CM | POA: Diagnosis not present

## 2023-12-20 HISTORY — PX: LOWER EXTREMITY ANGIOGRAPHY: CATH118251

## 2023-12-20 LAB — CREATININE, SERUM
Creatinine, Ser: 0.66 mg/dL (ref 0.44–1.00)
GFR, Estimated: >60 mL/min (ref >60)

## 2023-12-20 LAB — GLUCOSE, CAPILLARY
Glucose-Capillary: 164 mg/dL — ABNORMAL HIGH (ref 70–99)
Glucose-Capillary: 175 mg/dL — ABNORMAL HIGH (ref 70–99)

## 2023-12-20 LAB — BUN: BUN: 20 mg/dL (ref 8–23)

## 2023-12-20 SURGERY — LOWER EXTREMITY ANGIOGRAPHY
Anesthesia: Moderate Sedation | Site: Leg Lower | Laterality: Right

## 2023-12-20 MED ORDER — MIDAZOLAM HCL 5 MG/5ML IJ SOLN
INTRAMUSCULAR | Status: AC
  Filled 2023-12-20: qty 5

## 2023-12-20 MED ORDER — CEFAZOLIN SODIUM-DEXTROSE 2-4 GM/100ML-% IV SOLN
2.0000 g | INTRAVENOUS | Status: AC
  Administered 2023-12-20: 2 g via INTRAVENOUS

## 2023-12-20 MED ORDER — HYDRALAZINE HCL 20 MG/ML IJ SOLN: 5.0000 mg | INTRAMUSCULAR | Status: DC | PRN

## 2023-12-20 MED ORDER — SODIUM CHLORIDE 0.9% FLUSH: 3.0000 mL | INTRAVENOUS | Status: DC | PRN

## 2023-12-20 MED ORDER — DIPHENHYDRAMINE HCL 50 MG/ML IJ SOLN: 50.0000 mg | Freq: Once | INTRAMUSCULAR | Status: DC | PRN

## 2023-12-20 MED ORDER — METHYLPREDNISOLONE SODIUM SUCC 125 MG IJ SOLR: 125.0000 mg | Freq: Once | INTRAMUSCULAR | Status: DC | PRN

## 2023-12-20 MED ORDER — CLOPIDOGREL BISULFATE 75 MG PO TABS: 75.0000 mg | ORAL_TABLET | Freq: Every day | ORAL | Status: DC

## 2023-12-20 MED ORDER — ASPIRIN 81 MG PO TBEC: 81.0000 mg | DELAYED_RELEASE_TABLET | Freq: Every day | ORAL | Status: DC

## 2023-12-20 MED ORDER — LABETALOL HCL 5 MG/ML IV SOLN: 10.0000 mg | INTRAVENOUS | Status: DC | PRN

## 2023-12-20 MED ORDER — ACETAMINOPHEN 325 MG PO TABS: 650.0000 mg | ORAL_TABLET | ORAL | Status: DC | PRN

## 2023-12-20 MED ORDER — MIDAZOLAM HCL 2 MG/ML PO SYRP: 8.0000 mg | ORAL_SOLUTION | Freq: Once | ORAL | Status: DC | PRN

## 2023-12-20 MED ORDER — HEPARIN (PORCINE) IN NACL 1000-0.9 UT/500ML-% IV SOLN
INTRAVENOUS | Status: DC | PRN
  Administered 2023-12-20: 500 mL

## 2023-12-20 MED ORDER — SODIUM CHLORIDE 0.9 % IV SOLN: INTRAVENOUS | Status: DC

## 2023-12-20 MED ORDER — FENTANYL CITRATE (PF) 100 MCG/2ML IJ SOLN
INTRAMUSCULAR | Status: DC | PRN
  Administered 2023-12-20: 50 ug via INTRAVENOUS

## 2023-12-20 MED ORDER — SODIUM CHLORIDE 0.9% FLUSH: 3.0000 mL | Freq: Two times a day (BID) | INTRAVENOUS | Status: DC

## 2023-12-20 MED ORDER — HEPARIN SODIUM (PORCINE) 1000 UNIT/ML IJ SOLN
INTRAMUSCULAR | Status: AC
  Filled 2023-12-20: qty 10

## 2023-12-20 MED ORDER — CEFAZOLIN SODIUM-DEXTROSE 2-4 GM/100ML-% IV SOLN
INTRAVENOUS | Status: AC
  Filled 2023-12-20: qty 100

## 2023-12-20 MED ORDER — LIDOCAINE-EPINEPHRINE (PF) 1 %-1:200000 IJ SOLN
INTRAMUSCULAR | Status: DC | PRN
  Administered 2023-12-20: 10 mL

## 2023-12-20 MED ORDER — HEPARIN SODIUM (PORCINE) 1000 UNIT/ML IJ SOLN
INTRAMUSCULAR | Status: DC | PRN
  Administered 2023-12-20: 5000 [IU] via INTRAVENOUS

## 2023-12-20 MED ORDER — IODIXANOL 320 MG/ML IV SOLN
INTRAVENOUS | Status: DC | PRN
  Administered 2023-12-20: 45 mL

## 2023-12-20 MED ORDER — ONDANSETRON HCL 4 MG/2ML IJ SOLN: 4.0000 mg | Freq: Four times a day (QID) | INTRAMUSCULAR | Status: DC | PRN

## 2023-12-20 MED ORDER — FENTANYL CITRATE (PF) 100 MCG/2ML IJ SOLN
INTRAMUSCULAR | Status: AC
  Filled 2023-12-20: qty 2

## 2023-12-20 MED ORDER — SODIUM CHLORIDE 0.9 % IV SOLN: 250.0000 mL | INTRAVENOUS | Status: DC | PRN

## 2023-12-20 MED ORDER — HYDROMORPHONE HCL 1 MG/ML IJ SOLN: 1.0000 mg | Freq: Once | INTRAMUSCULAR | Status: DC | PRN

## 2023-12-20 MED ORDER — FAMOTIDINE 20 MG PO TABS: 40.0000 mg | ORAL_TABLET | Freq: Once | ORAL | Status: DC | PRN

## 2023-12-20 MED ORDER — MIDAZOLAM HCL 2 MG/2ML IJ SOLN
INTRAMUSCULAR | Status: DC | PRN
  Administered 2023-12-20: 2 mg via INTRAVENOUS

## 2023-12-20 SURGICAL SUPPLY — 14 items
BALLOON LUTONIX 4X220X130 (BALLOONS) IMPLANT
CATH ANGIO 5F PIGTAIL 65CM (CATHETERS) IMPLANT
CATH BEACON 5 .038 100 VERT TP (CATHETERS) IMPLANT
COVER PROBE ULTRASOUND 5X96 (MISCELLANEOUS) IMPLANT
DEVICE PRESTO INFLATION (MISCELLANEOUS) IMPLANT
DEVICE STARCLOSE SE CLOSURE (Vascular Products) IMPLANT
GLIDEWIRE ADV .035X260CM (WIRE) IMPLANT
LIFESTENT SOLO 6X200X135 (Permanent Stent) IMPLANT
PACK ANGIOGRAPHY (CUSTOM PROCEDURE TRAY) ×1 IMPLANT
SHEATH ANL2 6FRX45 HC (SHEATH) IMPLANT
SHEATH BRITE TIP 5FRX11 (SHEATH) IMPLANT
SYR MEDRAD MARK 7 150ML (SYRINGE) IMPLANT
TUBING CONTRAST HIGH PRESS 72 (TUBING) IMPLANT
WIRE J 3MM .035X145CM (WIRE) IMPLANT

## 2023-12-20 NOTE — Interval H&P Note (Signed)
 History and Physical Interval Note:  12/20/2023 9:13 AM  Angela Jensen  has presented today for surgery, with the diagnosis of RLE Angio   ASO w rest pain.  The various methods of treatment have been discussed with the patient and family. After consideration of risks, benefits and other options for treatment, the patient has consented to  Procedure(s): Lower Extremity Angiography (Right) as a surgical intervention.  The patient's history has been reviewed, patient examined, no change in status, stable for surgery.  I have reviewed the patient's chart and labs.  Questions were answered to the patient's satisfaction.     Nadir Vasques

## 2023-12-20 NOTE — Progress Notes (Signed)
 Dr. Vonna Guardian came by bedside and spoke with pt. And her S.O. "Butch" re: procedural results. Both verbalized understanding of conversation with MD.

## 2023-12-20 NOTE — Op Note (Signed)
 [facility]  Percutaneous Study/Intervention Procedural Note   Date of Surgery: 12/20/2023  Surgeon(s):Shivaan Tierno    Assistants:none  Pre-operative Diagnosis: PAD with rest pain RLE  Post-operative diagnosis:  Same  Procedure(s) Performed:             1.  Ultrasound guidance for vascular access left femoral artery             2.  Catheter placement into right common femoral artery from left femoral approach             3.  Aortogram and selective right lower extremity angiogram             4.  Percutaneous transluminal angioplasty of right SFA with 4 mm diameter by 22 cm length Lutonix drug-coated angioplasty balloon             5.  Stent placement to the right SFA with 6 mm diameter by 20 cm length life stent  6.  StarClose closure device left femoral artery  EBL: 5 cc  Contrast: 45 cc  Fluoro Time: 4.9 minutes  Moderate Conscious Sedation Time: approximately 40 minutes using 2 mg of Versed  and 50 mcg of Fentanyl               Indications:  Patient is a 68 y.o.female with a long history of peripheral arterial disease with multiple previous interventions on the left lower extremity now with pain on the right side worrisome for rest pain. The patient has noninvasive study showing significant reduction in her right ABI down to 0.7 with monophasic waveforms distally. The patient is brought in for angiography for further evaluation and potential treatment.  Due to the limb threatening nature of the situation, angiogram was performed for attempted limb salvage. The patient is aware that if the procedure fails, amputation would be expected.  The patient also understands that even with successful revascularization, amputation may still be required due to the severity of the situation.  Risks and benefits are discussed and informed consent is obtained.   Procedure:  The patient was identified and appropriate procedural time out was performed.  The patient was then placed  supine on the table and prepped and draped in the usual sterile fashion. Moderate conscious sedation was administered during a face to face encounter with the patient throughout the procedure with my supervision of the RN administering medicines and monitoring the patient's vital signs, pulse oximetry, telemetry and mental status throughout from the start of the procedure until the patient was taken to the recovery room. Ultrasound was used to evaluate the left common femoral artery.  It was patent .  A digital ultrasound image was acquired.  A Seldinger needle was used to access the left common femoral artery under direct ultrasound guidance and a permanent image was performed.  A 0.035 J wire was advanced without resistance and a 5Fr sheath was placed.  Pigtail catheter was placed into the aorta and an AP aortogram was performed. This demonstrated normal renal arteries and normal aorta and iliac segments without significant stenosis although they were quite small. I then crossed the aortic bifurcation and advanced to the right femoral head. Selective right lower extremity angiogram was then performed. This demonstrated some degree of disease in the right common femoral artery at least approaching 50%.  The right SFA in the proximal to mid segment was heavily diseased with multiple areas of greater than 80% stenosis.  The distal SFA and above-knee popliteal artery normalized although  it was fairly small.  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  The anterior tibial artery was the largest runoff to the foot. It was felt that it was in the patient's best interest to proceed with intervention after these images to avoid a second procedure and a larger amount of contrast and fluoroscopy based off of the findings from the initial angiogram. The patient was  systemically heparinized and a 6 [place] An[place] sheath was then placed over the [employer] wire. I then used a Kumpe catheter and the advantage wire to navigate through the SFA and common femoral disease without difficulty.  I then took a 4 mm diameter by 22 cm length Lutonix drug-coated angioplasty balloon and inflated this to 14 atm for 1 minute from the right common femoral artery down to the right mid to distal SFA.  Completion imaging showed significant residual disease greater than 50% throughout much of the SFA so I elected to place a stent.  A 6 mm diameter by 20 cm length life stent was deployed in the proximal SFA down to the mid to distal SFA and then postdilated with a 4 mm diameter Lutonix drug-coated angioplasty balloon with excellent angiographic completion result and only about a 10% residual stenosis with maintained runoff distally. I elected to terminate the procedure. The sheath was removed and StarClose closure device was deployed in the left femoral artery with excellent hemostatic result. The patient was taken to the recovery room in stable condition having tolerated the procedure well.  Findings:               Aortogram:  This demonstrated normal renal arteries and normal aorta and iliac segments without significant stenosis although they were quite small.             Right Lower Extremity:  This demonstrated some degree of disease in the right common femoral artery at least approaching 50%.  The right SFA in the proximal to mid segment was heavily diseased with multiple areas of greater than 80% stenosis.  The distal SFA and above-knee popliteal artery normalized although it was fairly small.  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  The anterior tibial artery was the largest runoff to the foot.   Disposition: Patient was  taken to the recovery room in stable condition having tolerated the procedure well.  Complications: None  Mikki Alexander 12/20/2023 10:32 AM   This note was created with Dragon Medical transcription system. Any errors in dictation are purely unintentional.

## 2023-12-22 ENCOUNTER — Telehealth (INDEPENDENT_AMBULATORY_CARE_PROVIDER_SITE_OTHER): Payer: Self-pay

## 2023-12-22 ENCOUNTER — Other Ambulatory Visit: Payer: Self-pay | Admitting: Physician Assistant

## 2023-12-22 DIAGNOSIS — J449 Chronic obstructive pulmonary disease, unspecified: Secondary | ICD-10-CM

## 2023-12-22 DIAGNOSIS — Z09 Encounter for follow-up examination after completed treatment for conditions other than malignant neoplasm: Secondary | ICD-10-CM

## 2023-12-22 NOTE — Telephone Encounter (Signed)
 Patient called stating that since her angio on Monday with Dr. Vonna Guardian she has been itching. Patient stated she has taken Benadryl  and that in the past Zyrtec has worked. I advised that the patient take the Zyrtec and if it doesn't work to call back.

## 2023-12-22 NOTE — Telephone Encounter (Signed)
 She's probably having a reaction to the contrast, let's send her in a medrol  dose pack

## 2023-12-22 NOTE — Telephone Encounter (Signed)
 Prescription has been left on [employer]. Also I have attempted to contact the patient but I am not able to leave a voicemail.

## 2023-12-22 NOTE — Telephone Encounter (Signed)
 Patient left a message stating that she has been itching since procedure on 12/20/23. Patient had right le angio. I did attempt to contact the patient but was not able to leave a voicemail. Please Advise

## 2023-12-23 ENCOUNTER — Other Ambulatory Visit: Payer: Self-pay

## 2023-12-23 DIAGNOSIS — E1169 Type 2 diabetes mellitus with other specified complication: Secondary | ICD-10-CM

## 2023-12-23 DIAGNOSIS — Z79899 Other long term (current) drug therapy: Secondary | ICD-10-CM

## 2023-12-23 NOTE — Progress Notes (Signed)
 12/23/2023 Name: Angela Jensen MRN: [id number] DOB: 1955-10-06  Chief Complaint  Patient presents with   Diabetes    [facility]    Angela Jensen is a 68 y.o. year old female who presented for a telephone visit.   They were referred to the pharmacist by a quality report for assistance in managing diabetes.    Subjective:  Care Team: Primary Care Provider: Laurence Pons, NP ; Next Scheduled Visit: 01/12/2024   Medication Access/Adherence  Current Pharmacy:  [facility] [address] ([address]), [place] - [address] Angela Jensen) [place] [address] Phone: [phone] Fax: [phone]   Patient reports affordability concerns with their medications: No  Patient reports access/transportation concerns to their pharmacy: No  Patient reports adherence concerns with their medications:  No  Her spouse helps her remember to take medication. However, there is a gap in the fill history of lisinopril /HCTZ, per Dr. Anson Jensen, and patient is unable to attribute to the gap as she denies adherence issues.   Diabetes:  Current medications: Toujeo  8 units daily on most days (prescribed 10 units daily; also stated she takes 8-10 units during medication review), metformin  XR 1000 mg daily Other: atorvastatin  10 mg daily(prior documentation of reported muscle pain while on Crestor )   Current glucose readings:  - During Meals 141-165 - Post Meals 167 - Fasting 121-141  Using BGM meter; testing 2-3 times daily  Date of Download: She reports that she doesn't wear the CGM that often due to patient feels it is not accurate. However she couldn't quantify how often she wears. Educated patient about how CGM vs BGM works, but patient did not appear interested in explanation. Encouraged patient to wear CGM more frequently and explained the importance for appointments with PCP.    Patient denies hypoglycemic symptoms including dizziness, shakiness, sweating.  Patient reports hyperglycemic symptoms including neuropathy.  Current meal patterns: Since last A1c she reports that she improved her diet - Breakfast: oatmeal, eggs, bacon, sausage, cereal  - Lunch sandwich (mostly)  - Supper chicken (air fried), pork chop (air fried), lambs (air fried), hamburger helper  - Snacks oranges, apples, plums, berries, crackers, oatmeal cakes (occasionally) - Drinks water  and diet/ sugar-free sodas, Gatorade (regular) and feels the diet tastes sweeter  Current physical activity: Not at this time and not interested. She believes taking care of 94-month old great grandchild and walking around when grocery shopping is exercise.   The 10-year ASCVD risk score (Arnett DK, et al., 2019) is: 14.4%   Values used to calculate the score:     Age: 17 years     Sex: Female     Is Non-Hispanic African American: Yes     Diabetic: Yes     Tobacco smoker: No     Systolic Blood Pressure: 114 mmHg     Is BP treated: Yes     HDL Cholesterol: 53 mg/dL     Total Cholesterol: 143 mg/dL    Hypertension:  Current medications: furosemide  20 mg daily PRN (heart failure), lisinopril - hydrochlorothiazide  20-25 mg daily, metoprolol  succinate 25 mg once daily   Patient has a validated, automated, upper arm home BP cuff - checks twice daily (early morning and late evening) Current blood pressure readings readings:  SBP: 120-130 DBP: 80s  Patient denies hypotensive s/sx including dizziness, lightheadedness.  Patient denies hypertensive symptoms including headache, chest pain, shortness of breath    Objective:  Lab Results  Component Value Date  HGBA1C 8.9 (H) 10/04/2023    Lab Results  Component Value Date   CREATININE 0.66 12/20/2023   BUN 20 12/20/2023   NA 144 10/04/2023   K 4.2 10/04/2023   CL 99 10/04/2023   CO2 27 10/04/2023    Lab Results  Component Value Date   CHOL 143 10/04/2023   HDL 53 10/04/2023   LDLCALC 75 10/04/2023   TRIG 74 10/04/2023    CHOLHDL 2.7 10/04/2023    Medications Reviewed Today     Reviewed by Angela Jensen, RPH (Pharmacist) on 12/23/23 at 1450  Med List Status: <None>   Medication Order Taking? Sig Documenting Provider Last Dose Status Informant  ACCU-CHEK GUIDE TEST test strip [id number] Yes USE TO TEST BLOOD SUGAR 5 TIMES DAILY Jensen, Alyssa, NP Taking Active   Accu-Chek Softclix Lancets lancets [id number] Yes Use 1 lancet 3 times daily to check glucose for diabetes Angela Pons, NP Taking Active Other, Pharmacy Records  albuterol  (VENTOLIN  HFA) 108 (90 Base) MCG/ACT inhaler [id number] Yes INHALE 2 PUFFS BY MOUTH EVERY 6 HOURS AS NEEDED FOR WHEEZING OR SHORTNESS OF Angela Jensen, Alyssa, NP Taking Active   ALPRAZolam  (XANAX ) 0.25 MG tablet [id number] Yes Take 1 tablet (0.25 mg total) by mouth 2 (two) times daily as needed for anxiety.  Patient taking differently: Take 0.25 mg by mouth 2 (two) times daily as needed for anxiety.   Angela Pons, NP Taking Active Self  aspirin  EC 81 MG tablet [id number] Yes Take 1 tablet (81 mg total) by mouth daily. Swallow whole. Angela College, MD Taking Active   atorvastatin  (LIPITOR) 10 MG tablet [id number] Yes Take 1 tablet (10 mg total) by mouth daily. Angela College, MD Taking Active   Blood Glucose Monitoring Suppl (ACCU-CHEK GUIDE) w/Device Angela Jensen [id number] Yes Use as directed Dx e11.65 Jensen, Angela M, MD Taking Active Other, Pharmacy Records  calcium  carbonate (OSCAL) 1500 (600 Ca) MG TABS tablet [id number] Yes Take 600 mg of elemental calcium  by mouth 2 (two) times daily with a meal. [provider] Taking Active   clopidogrel  (PLAVIX ) 75 MG tablet [id number] Yes Take 1 tablet (75 mg total) by mouth daily. Angela Pons, NP Taking Active   Continuous Blood Gluc Receiver Yvonna Herder G7 [place]) DEVI [id number] Yes Use one as directed for uncontrolled dm Angela Pons, NP Taking Active Other, Pharmacy Records  Continuous Glucose Sensor (DEXCOM G7 SENSOR) MISC  [id number] Yes USE AS DIRECTED. CHANGE EVERY 10 DAYS. Angela Pons, NP Taking Active   cyclobenzaprine  (FLEXERIL ) 10 MG tablet [id number] Yes Take 1 tablet (10 mg total) by mouth at bedtime. Take one tab po qhs for back spasm prn only Angela Pons, NP Taking Active            Med Note Vallarie Gauze, [facility] R   Thu Dec 23, 2023  2:40 PM) Taking prn  Ensifentrine  (OHTUVAYRE ) 3 MG/2.5ML SUSP [id number] No Inhale 3 mg into the lungs in the morning and at bedtime.  Patient not taking: Reported on 12/23/2023   Angela Pons, NP Not Taking Active Other, Pharmacy Records  escitalopram  (LEXAPRO ) 10 MG tablet [id number] Yes Take 1 tablet (10 mg total) by mouth daily. Angela Pons, NP Taking Active   famotidine  (PEPCID ) 20 MG tablet [id number]  Take 1 tablet (20 mg total) by mouth daily for 14 days.  Patient taking differently: Take 20 mg by mouth daily as needed for heartburn.   Garnet Just, MD  Expired 12/20/23 2359 Other, Pharmacy Records  ferrous sulfate  325 (65 FE)  MG tablet [id number] Yes Take 325 mg by mouth 2 (two) times daily with a meal. [provider] Taking Active Other, Pharmacy Records  furosemide  (LASIX ) 20 MG tablet [id number] Yes Take 1 tablet (20 mg total) by mouth daily as needed. Angela Pons, NP Taking Active Other, Pharmacy Records  gabapentin  (NEURONTIN ) 300 MG capsule [id number] Yes TAKE 1 CAPSULE BY MOUTH THREE TIMES DAILY Jensen, Alyssa, NP Taking Active   insulin  glargine, 1 Unit Dial , (TOUJEO ) 300 UNIT/ML Solostar Pen [id number] Yes Inject 10 Units into the skin daily. Angela Pons, NP Taking Active            Med Note Vallarie Gauze, [facility] R   Thu Dec 23, 2023  2:45 PM) Taking differently: 8-10 units daily  Insulin  Pen Needle (RELION PEN NEEDLES) 31G X 6 MM MISC [id number] Yes USE 1 PEN NEEDLE WITH INSULIN  PEN ONCE DAILY FOR DIABETES Angela Pons, NP Taking Active Other, Pharmacy Records  ipratropium-albuterol  (DUONEB) 0.5-2.5 (3) MG/3ML SOLN [id number] Yes  USE 1 AMPULE IN NEBULIZER EVERY 4 HOURS AS NEEDED Jensen, Alyssa, NP Taking Active   lisinopril -hydrochlorothiazide  (ZESTORETIC ) 20-25 MG tablet [id number] Yes Take 1 tablet by mouth daily. Angela Pons, NP Taking Active   metFORMIN  (GLUCOPHAGE -XR) 500 MG 24 hr tablet [id number] Yes Take 2 tablets (1,000 mg total) by mouth daily with breakfast. Angela Pons, NP Taking Active   methylPREDNISolone  (MEDROL  DOSEPAK) 4 MG TBPK tablet [id number] No SMARTSIG:- Tablet(s) By Mouth -  Patient not taking: Reported on 12/23/2023   [provider] Not Taking Active            Med Note Alexandria Angel R   Thu Dec 23, 2023  2:38 PM) New prescription - patient was updated that the prescription was sent as she was not aware   metoprolol  succinate (TOPROL -XL) 25 MG 24 hr tablet [id number] Yes Take 1 tablet (25 mg total) by mouth daily. Angela Pons, NP Taking Active   mometasone -formoterol  (DULERA ) 200-5 MCG/ACT AERO [id number] Yes Inhale 2 puffs by mouth twice daily Jensen, Angela M, MD Taking Active   OXYGEN  [id number] Yes Inhale 4 L into the lungs. PT USES [facility] FOR OXYGEN  [provider] Taking Active Other, Pharmacy Records           Med Note Vallarie Gauze, [facility] R   Thu Dec 23, 2023  2:48 PM) Taking 3 L  potassium chloride  (KLOR-CON ) 10 MEQ tablet [id number] Yes TAKE 1 TABLET BY MOUTH EVERY OTHER DAY Jensen, Alyssa, NP Taking Active   SPIRIVA  HANDIHALER 18 MCG inhalation capsule [id number] Yes INHALE THE CONTENTS OF 1 CAPSULES BY MOUTH ONCE DAILY - DO NOT SWALLOW CAPSULES McDonough, Lauren K, PA-C Taking Active   vitamin B-12 (CYANOCOBALAMIN ) 100 MCG tablet [id number] Yes Take 100 mcg by mouth daily. [provider] Taking Active   VITAMIN D , CHOLECALCIFEROL , PO [id number] Yes Take 1 tablet by mouth in the morning. [provider] Taking Active Other, Pharmacy Records, Self              Assessment/Plan:   Diabetes: - Currently uncontrolled per last  hemoglobin A1c due to recent use of corticosteroids and diet. Although patient has modified her diet to help control diabetes and self adjusted Toujeo  to lower insulin  regimen. - Reviewed long term cardiovascular and renal outcomes of uncontrolled blood sugar - Reviewed goal A1c, goal fasting, and goal 2 hour post prandial glucose - Reviewed dietary modifications including Discussed continuing to work on lower glycemic index foods  - Reviewed lifestyle modifications including:  150 min/week of intentional exercise such simple walks in neighborhood with great grandchild - Recommend to continue current regimen, but discussed concern about self adjusting concentrated insulin  to a range. May suggest future considerations for moderate intensity statin to safely improve LDL without side effects, pending labs. - Recommend to check glucose four times daily with CGM (fasting, post prandial breakfast, lunch, dinner);  twice daily (fasting, post prandial)      Hypertension: - Currently controlled based on reported blood  - Reviewed long term cardiovascular and renal outcomes of uncontrolled blood pressure - Reviewed appropriate blood pressure monitoring technique and reviewed goal blood pressure. Recommended to check home blood pressure and heart rate as directed by vascular specialist - Recommend to continue current regimen      Follow Up Plan: PCP appointment on 01/11/2024, the PharmD telephone appointment 2 weeks after PCP appointment  Alexandria Angel, PharmD Clinical Pharmacist Cell: [phone]

## 2023-12-28 DIAGNOSIS — J449 Chronic obstructive pulmonary disease, unspecified: Secondary | ICD-10-CM | POA: Diagnosis not present

## 2023-12-28 DIAGNOSIS — J9610 Chronic respiratory failure, unspecified whether with hypoxia or hypercapnia: Secondary | ICD-10-CM | POA: Diagnosis not present

## 2024-01-03 ENCOUNTER — Telehealth (INDEPENDENT_AMBULATORY_CARE_PROVIDER_SITE_OTHER): Payer: Self-pay

## 2024-01-03 NOTE — Telephone Encounter (Signed)
 Patient left a message stating that she is peeling all over her body except the face. The stated that the right leg aching yesterday and notice bruising this morning. Ever since the procedure the right foot feels warm and left foot feels cold. Patient is schedule to come in 01/11/24. Patient had right le angio on 12/20/23. Please Advise

## 2024-01-03 NOTE — Telephone Encounter (Signed)
 Let's bring her in for studies sooner, did she get steroids that we sent in for her?

## 2024-01-04 ENCOUNTER — Encounter (INDEPENDENT_AMBULATORY_CARE_PROVIDER_SITE_OTHER): Payer: Self-pay

## 2024-01-04 ENCOUNTER — Other Ambulatory Visit (INDEPENDENT_AMBULATORY_CARE_PROVIDER_SITE_OTHER): Payer: Self-pay | Admitting: Vascular Surgery

## 2024-01-04 DIAGNOSIS — J449 Chronic obstructive pulmonary disease, unspecified: Secondary | ICD-10-CM | POA: Diagnosis not present

## 2024-01-04 DIAGNOSIS — I10 Essential (primary) hypertension: Secondary | ICD-10-CM | POA: Diagnosis not present

## 2024-01-04 DIAGNOSIS — I739 Peripheral vascular disease, unspecified: Secondary | ICD-10-CM

## 2024-01-04 DIAGNOSIS — R0602 Shortness of breath: Secondary | ICD-10-CM | POA: Diagnosis not present

## 2024-01-04 NOTE — Telephone Encounter (Signed)
 Patient did received steroids that was sent to pharmacy. Patient skin peeling is coming from the itching reaction and she should use moisturizing lotion to help with the peeling. As long as the patient is not having pain the appointment can stay as scheduled. I attempted to contact the patient but was able to leave a voicemail.

## 2024-01-04 NOTE — Telephone Encounter (Signed)
 I just spoke with her, let's see if we can squeeze her in sooner with her studies

## 2024-01-05 ENCOUNTER — Ambulatory Visit (INDEPENDENT_AMBULATORY_CARE_PROVIDER_SITE_OTHER): Admitting: Nurse Practitioner

## 2024-01-05 ENCOUNTER — Ambulatory Visit (INDEPENDENT_AMBULATORY_CARE_PROVIDER_SITE_OTHER)

## 2024-01-05 ENCOUNTER — Encounter (INDEPENDENT_AMBULATORY_CARE_PROVIDER_SITE_OTHER): Payer: Self-pay | Admitting: Nurse Practitioner

## 2024-01-05 VITALS — BP 141/88 | HR 102 | Resp 16 | Wt 121.2 lb

## 2024-01-05 DIAGNOSIS — Z794 Long term (current) use of insulin: Secondary | ICD-10-CM | POA: Diagnosis not present

## 2024-01-05 DIAGNOSIS — E0800 Diabetes mellitus due to underlying condition with hyperosmolarity without nonketotic hyperglycemic-hyperosmolar coma (NKHHC): Secondary | ICD-10-CM | POA: Diagnosis not present

## 2024-01-05 DIAGNOSIS — I739 Peripheral vascular disease, unspecified: Secondary | ICD-10-CM | POA: Diagnosis not present

## 2024-01-05 DIAGNOSIS — I10 Essential (primary) hypertension: Secondary | ICD-10-CM

## 2024-01-05 DIAGNOSIS — I70221 Atherosclerosis of native arteries of extremities with rest pain, right leg: Secondary | ICD-10-CM

## 2024-01-05 DIAGNOSIS — Z9889 Other specified postprocedural states: Secondary | ICD-10-CM

## 2024-01-06 ENCOUNTER — Telehealth (INDEPENDENT_AMBULATORY_CARE_PROVIDER_SITE_OTHER): Payer: Self-pay

## 2024-01-06 LAB — VAS US ABI WITH/WO TBI
Left ABI: 0.4
Right ABI: 0.91

## 2024-01-06 NOTE — Telephone Encounter (Signed)
 Spoke with the patient and she is scheduled with Dr. Vonna Guardian for a LLE angio at the [facility]. Arrival time of 8:30 am. Pre-procedure instructions were discussed and will be mailed. DYE ALLERGY PROTOCOL WAS CALLED INTO THE PATIENT'S PHARMACY AT [facility] [address].

## 2024-01-10 ENCOUNTER — Encounter (INDEPENDENT_AMBULATORY_CARE_PROVIDER_SITE_OTHER): Payer: Self-pay | Admitting: Nurse Practitioner

## 2024-01-10 NOTE — Progress Notes (Signed)
 Subjective:    [name], female    DOB: [birth year], 68 y.o.   MRN: [id number] Chief Complaint  Patient presents with   Follow-up    [facility] 3 week with ABI    The patient returns to the office for followup and review status post angiogram with intervention on 12/20/2023.   Procedure: Procedure(s) Performed:             1.  Ultrasound guidance for vascular access left femoral artery             2.  Catheter placement into right common femoral artery from left femoral approach             3.  Aortogram and selective right lower extremity angiogram             4.  Percutaneous transluminal angioplasty of right SFA with 4 mm diameter by 22 cm length Lutonix drug-coated angioplasty balloon             5.  Stent placement to the right SFA with 6 mm diameter by 20 cm length life stent             6.  StarClose closure device left femoral artery   The patient notes improvement in the lower extremity symptoms.  Of the right lower extremity.  She still continues to have neuropathy but is little bit better.  Her concern however is that her opposite foot is much more painful and she notes that it feels cold.  She also had significant itching and a rash following her most recent angiogram.  There have been no significant changes to the patient's overall health care.  No documented history of amaurosis fugax or recent TIA symptoms. There are no recent neurological changes noted. No documented history of DVT, PE or superficial thrombophlebitis. The patient denies recent episodes of angina or shortness of breath.   ABI's Rt=0.91 and Lt=0.40  (previous ABI's Rt=0.73 and Lt=0.86) Duplex US  of the right lower extremity reveals biphasic with good toe waveforms however dampened monophasic flow in the left    Review of Systems  Neurological:  Positive for numbness.  All other systems reviewed and are negative.      Objective:    Physical Exam Vitals reviewed.  HENT:     Head:  Normocephalic.  Cardiovascular:     Rate and Rhythm: Normal rate.     Pulses: Normal pulses.  Pulmonary:     Effort: Pulmonary effort is normal.  Skin:    General: Skin is warm and dry.  Neurological:     Mental Status: She is alert and oriented to person, place, and time.  Psychiatric:        Mood and Affect: Mood normal.        Behavior: Behavior normal.        Thought Content: Thought content normal.        Judgment: Judgment normal.     BP (!) 141/88   Pulse (!) 102   Resp 16   Wt 121 lb 3.2 oz (55 kg)   BMI 24.48 kg/m   Past Medical History:  Diagnosis Date   Anemia    Arthritis    Asthma    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/2016   Cancer (HCC) 2012   Right Lung CA   COPD (chronic obstructive pulmonary disease) (HCC)    Depression    Diabetes mellitus without complication (HCC)  Patient takes Janumet   Essential hypertension 05/26/2016   Heart failure (HCC) 2022   Hydropneumothorax 05/03/2020   Hypercholesteremia    Oxygen  dependent    2L at nite    PAD (peripheral artery disease) (HCC) 06/22/2016   Peripheral vascular disease (HCC)    Personal history of radiation therapy    Shortness of breath dyspnea    with exertion    Sialolithiasis    Sleep apnea    Wears dentures    full upper and lower    Social History   Socioeconomic History   Marital status: Widowed    Spouse name: Not on file   Number of children: Not on file   Years of education: Not on file   Highest education level: Not on file  Occupational History   Not on file  Tobacco Use   Smoking status: Former    Current packs/day: 0.00    Average packs/day: 1 pack/day for 37.0 years (37.0 ttl pk-yrs)    Types: Cigarettes    Start date: 02/06/1973    Quit date: 02/06/2010    Years since quitting: 13.9   Smokeless tobacco: Former    Types: Snuff  Vaping Use   Vaping status: Never Used  Substance and Sexual Activity   Alcohol  use: Not Currently    Alcohol/week: 5.0 standard drinks of alcohol    Types: 5 Cans of beer per week    Comment: /h x of alcohol abuse -stopped 2012- now drinks 5 beer per week   Drug use: Not Currently    Types: Marijuana, "Crack" cocaine, Cocaine    Comment: hx of cocaine use- last use 2015; last use marijuana6/22/19,   Sexual activity: Yes  Other Topics Concern   Not on file  Social History Narrative   Lives with Significant Other x 43 years   Social Drivers of Corporate investment banker Strain: Not on file  Food Insecurity: No Food Insecurity (06/15/2023)   Hunger Vital Sign    Worried About Running Out of Food in the Last Year: Never true    Ran Out of Food in the Last Year: Never true  Transportation Needs: No Transportation Needs (06/15/2023)   PRAPARE - Administrator, Civil Service (Medical): No    Lack of Transportation (Non-Medical): No  Physical Activity: Not on file  Stress: Not on file  Social Connections: Not on file  Intimate Partner Violence: Not At Risk (06/15/2023)   Humiliation, Afraid, Rape, and Kick questionnaire    Fear of Current or Ex-Partner: No    Emotionally Abused: No    Physically Abused: No    Sexually Abused: No    Past Surgical History:  Procedure Laterality Date   CESAREAN SECTION     x3   COLONOSCOPY WITH PROPOFOL  N/A 06/25/2015   Procedure: COLONOSCOPY WITH PROPOFOL ;  Surgeon: Marnee Sink, MD;  Location: [facility];  Service: Endoscopy;  Laterality: N/A;   COLONOSCOPY WITH PROPOFOL  N/A 07/26/2020   Procedure: COLONOSCOPY WITH PROPOFOL ;  Surgeon: Marnee Sink, MD;  Location: [facility];  Service: Endoscopy;  Laterality: N/A;   CYST REMOVAL LEG     and on shoulder    ESOPHAGOGASTRODUODENOSCOPY (EGD) WITH PROPOFOL  N/A 07/26/2020   Procedure: ESOPHAGOGASTRODUODENOSCOPY (EGD) WITH PROPOFOL ;  Surgeon: Marnee Sink, MD;  Location: [facility];  Service: Endoscopy;  Laterality: N/A;  Diabetic - oral meds    LOWER EXTREMITY ANGIOGRAPHY Left 09/29/2018   Procedure: LOWER EXTREMITY ANGIOGRAPHY;  Surgeon: Mikki Alexander  S, MD;  Location: [facility];  Service: Cardiovascular;  Laterality: Left;   LOWER EXTREMITY ANGIOGRAPHY Left 07/29/2023   Procedure: Lower Extremity Angiography;  Surgeon: Celso College, MD;  Location: [facility];  Service: Cardiovascular;  Laterality: Left;   LOWER EXTREMITY ANGIOGRAPHY Right 12/20/2023   Procedure: Lower Extremity Angiography;  Surgeon: Celso College, MD;  Location: [facility];  Service: Cardiovascular;  Laterality: Right;   LUNG BIOPSY  12/30/2011   has lung "spots"   PACEMAKER IMPLANT  07/14/2021   PACEMAKER LEADLESS INSERTION N/A 07/14/2021   Procedure: PACEMAKER LEADLESS INSERTION;  Surgeon: Percival Brace, MD;  Location: [facility];  Service: Cardiovascular;  Laterality: N/A;   PERIPHERAL VASCULAR CATHETERIZATION Left 06/01/2016   Procedure: Lower Extremity Angiography;  Surgeon: Celso College, MD;  Location: [facility];  Service: Cardiovascular;  Laterality: Left;   PERIPHERAL VASCULAR CATHETERIZATION N/A 06/01/2016   Procedure: Abdominal Aortogram w/Lower Extremity;  Surgeon: Celso College, MD;  Location: [facility];  Service: Cardiovascular;  Laterality: N/A;   PERIPHERAL VASCULAR CATHETERIZATION  06/01/2016   Procedure: Lower Extremity Intervention;  Surgeon: Celso College, MD;  Location: [facility];  Service: Cardiovascular;;   PERIPHERAL VASCULAR CATHETERIZATION Right 06/08/2016   Procedure: Lower Extremity Angiography;  Surgeon: Celso College, MD;  Location: [facility];  Service: Cardiovascular;  Laterality: Right;   PERIPHERAL VASCULAR CATHETERIZATION  06/08/2016   Procedure: Lower Extremity Intervention;  Surgeon: Celso College, MD;  Location: [facility];  Service: Cardiovascular;;   SUBMANDIBULAR GLAND EXCISION Left 12/06/2020   Procedure: EXCISION SUBMANDIBULAR GLAND;   Surgeon: Lesly Raspberry, MD;  Location: [facility];  Service: ENT;  Laterality: Left;  needs to be first case Diabetic - diet controlled   TEMPORARY PACEMAKER N/A 07/11/2021   Procedure: TEMPORARY PACEMAKER;  Surgeon: Percival Brace, MD;  Location: [facility];  Service: Cardiovascular;  Laterality: N/A;    Family History  Problem Relation Age of Onset   Diabetes Mother    Hypercholesterolemia Mother    Lung cancer Father    Diabetes Sister    Diabetes Sister    Hypertension Sister    Diabetes Maternal Grandmother    Diabetes Paternal Grandmother    Heart attack Brother    Coronary artery disease Brother    Vascular Disease Brother    Heart attack Brother    Breast cancer Neg Hx     Allergies  Allergen Reactions   Iodinated Contrast Media Itching   Trelegy Ellipta  [Fluticasone -Umeclidin-Vilant]     Had breathing issues       Latest Ref Rng & Units 10/08/2023   10:04 AM 10/04/2023   10:47 AM 07/06/2023   10:17 AM  CBC  WBC 4.0 - 10.5 K/uL 5.4  4.7  4.8   Hemoglobin 12.0 - 15.0 g/dL 19.1  47.8  9.4   Hematocrit 36.0 - 46.0 % 42.5  37.6  31.9   Platelets 150 - 400 K/uL 264  236  282        CMP     Component Value Date/Time   NA 144 10/04/2023 1047   NA 141 04/17/2014 1055   K 4.2 10/04/2023 1047   K 3.9 04/17/2014 1055   CL 99 10/04/2023 1047   CL 102 04/17/2014 1055   CO2 27 10/04/2023 1047   CO2 30 04/17/2014 1055   GLUCOSE 173 (H) 10/04/2023 1047   GLUCOSE  215 (H) 07/06/2023 1016   GLUCOSE 201 (H) 04/17/2014 1055   BUN 20 12/20/2023 0839   BUN 12 10/04/2023 1047   BUN 10 04/17/2014 1055   CREATININE 0.66 12/20/2023 0839   CREATININE 0.84 07/06/2023 1016   CREATININE 0.90 04/17/2014 1055   CALCIUM  9.4 10/04/2023 1047   CALCIUM  9.1 04/17/2014 1055   PROT 6.9 10/04/2023 1047   PROT 8.1 04/17/2014 1055   ALBUMIN 4.4 10/04/2023 1047   ALBUMIN 4.1 04/17/2014 1055   AST 22 10/04/2023 1047   AST 24 07/06/2023 1016   ALT 19  10/04/2023 1047   ALT 27 07/06/2023 1016   ALT 29 04/17/2014 1055   ALKPHOS 96 10/04/2023 1047   ALKPHOS 73 04/17/2014 1055   BILITOT 0.2 10/04/2023 1047   BILITOT 0.4 07/06/2023 1016   EGFR 97 10/04/2023 1047   GFRNONAA >60 12/20/2023 0839   GFRNONAA >60 07/06/2023 1016   GFRNONAA >60 04/17/2014 1055     VAS US  ABI WITH/WO TBI Result Date: 01/06/2024  LOWER EXTREMITY DOPPLER STUDY Patient Name:  TERICKA DEVINCENZI  Date of Exam:   01/05/2024 Medical Rec #: [id number]     Accession #:    [id number] Date of Birth: Oct 07, 1955     Patient Gender: F Patient Age:   23 years Exam Location:  [facility] Procedure:      VAS US  ABI WITH/WO TBI Referring Phys: Reymundo Caulk DEW --------------------------------------------------------------------------------  Indications: Claudication, and peripheral artery disease. Other Factors: Left SFA stents are known to be occluded.                Patient complains of constant numbness in left foot.                 07/29/2023: Aortogram and selective Left Lower                ExtremityAngiogram. Mechanical thrombectomy of the Left SFA and                Popliteal Artery with the [place] Rex device. Stent placement to the                Proximal Left SFA with 6 mm diameter by 5 cm length Viabahn                stent. Stent placement to the Left Popliteal Artery above the                knee with 6 mm diameter by 5 cm length Viabahn stent.                 12/20/2023: Aortogram and Selective Right Lower                ExtremityAngiogram. PTA of the Right SFA with 4 mm diameter by 22                cm length Lutonix drug coated angioplasty balloon. Stent                placement to the Right SFA with 6 mm by 20 cm length Life stent.  Vascular Interventions: 06/01/16: Left distal SFA/popliteal arteries PTAs with                         SFA stent x2;                         06/08/16: Left SFA  PTA;                         09/29/18: Left SFA stent x2;. Comparison Study: 12/01/2023  Performing Technologist: Tonie Franks RVS  Examination Guidelines: A complete evaluation includes at minimum, Doppler waveform signals and systolic blood pressure reading at the level of bilateral brachial, anterior tibial, and posterior tibial arteries, when vessel segments are accessible. Bilateral testing is considered an integral part of a complete examination. Photoelectric Plethysmograph (PPG) waveforms and toe systolic pressure readings are included as required and additional duplex testing as needed. Limited examinations for reoccurring indications may be performed as noted.  ABI Findings: +---------+------------------+-----+--------+--------+ Right    Rt Pressure (mmHg)IndexWaveformComment  +---------+------------------+-----+--------+--------+ Brachial 115                                     +---------+------------------+-----+--------+--------+ ATA      100               0.87 biphasic         +---------+------------------+-----+--------+--------+ PTA      105               0.91 biphasic         +---------+------------------+-----+--------+--------+ Great Toe108               0.94 Normal           +---------+------------------+-----+--------+--------+ +---------+------------------+-----+-------------------+-------+ Left     Lt Pressure (mmHg)IndexWaveform           Comment +---------+------------------+-----+-------------------+-------+ Brachial 114                                               +---------+------------------+-----+-------------------+-------+ ATA      43                0.37 dampened monophasic        +---------+------------------+-----+-------------------+-------+ PTA                             Non pulsatile flow         +---------+------------------+-----+-------------------+-------+ PERO     46                0.40 dampened monophasic        +---------+------------------+-----+-------------------+-------+ Great Toe32                 0.28                            +---------+------------------+-----+-------------------+-------+ +-------+-----------+-----------+------------+------------+ ABI/TBIToday's ABIToday's TBIPrevious ABIPrevious TBI +-------+-----------+-----------+------------+------------+ Right  .91        .94        .73         .85          +-------+-----------+-----------+------------+------------+ Left   .40        .28        .86         .94          +-------+-----------+-----------+------------+------------+ Right ABIs and TBIs appear increased compared to prior study on 12/01/2023. Left ABIs and TBIs appear decreased compared to prior study on 12/01/2023.  Summary: Right: Resting right ankle-brachial index indicates mild right lower  extremity arterial disease. The right toe-brachial index is normal. Left: Resting left ankle-brachial index indicates severe left lower extremity arterial disease. The left toe-brachial index is abnormal. *See table(s) above for measurements and observations.  Electronically signed by Mikki Alexander MD on 01/06/2024 at 8:12:02 AM.    Final    VAS US  ABI WITH/WO TBI Result Date: 12/06/2023  LOWER EXTREMITY DOPPLER STUDY Patient Name:  ROMAINE MACIOLEK  Date of Exam:   12/01/2023 Medical Rec #: [id number]     Accession #:    [id number] Date of Birth: 08/11/1956     Patient Gender: F Patient Age:   27 years Exam Location:  [facility] Procedure:      VAS US  ABI WITH/WO TBI Referring Phys: Mikki Alexander --------------------------------------------------------------------------------  Indications: Claudication, and peripheral artery disease. Other Factors: Left SFA stents are known to be occluded.                Patient complains of constant numbness in left foot.                 07/29/2023: Aortogram and selective Left Lower                ExtremityAngiogram. Mechanical thrombectomy of the Left SFA and                Popliteal Artery with the [place] Rex device. Stent placement to  the                Proximal Left SFA with 6 mm diameter by 5 cm length Viabahn                stent. Stent placement to the Left Popliteal Artery above the                knee with 6 mm diameter by 5 cm length Viabahn stent.  Vascular Interventions: 06/01/16: Left distal SFA/popliteal arteries PTAs with                         SFA stent x2;                         06/08/16: Left SFA PTA;                         09/29/18: Left SFA stent x2;. Comparison Study: 09/01/2023 Performing Technologist: Tonie Franks RVS  Examination Guidelines: A complete evaluation includes at minimum, Doppler waveform signals and systolic blood pressure reading at the level of bilateral brachial, anterior tibial, and posterior tibial arteries, when vessel segments are accessible. Bilateral testing is considered an integral part of a complete examination. Photoelectric Plethysmograph (PPG) waveforms and toe systolic pressure readings are included as required and additional duplex testing as needed. Limited examinations for reoccurring indications may be performed as noted.  ABI Findings: +---------+------------------+-----+--------+--------+ Right    Rt Pressure (mmHg)IndexWaveformComment  +---------+------------------+-----+--------+--------+ Brachial 125                                     +---------+------------------+-----+--------+--------+ ATA      88                0.70 biphasic         +---------+------------------+-----+--------+--------+ PTA      91  0.73 biphasic         +---------+------------------+-----+--------+--------+ Great Toe106               0.85 Normal           +---------+------------------+-----+--------+--------+ +---------+------------------+-----+--------+-------+ Left     Lt Pressure (mmHg)IndexWaveformComment +---------+------------------+-----+--------+-------+ Brachial 119                                     +---------+------------------+-----+--------+-------+ ATA      108               0.86 biphasic        +---------+------------------+-----+--------+-------+ PTA      99                0.79 biphasic        +---------+------------------+-----+--------+-------+ Darene Economy               0.94 Normal          +---------+------------------+-----+--------+-------+ +-------+-----------+-----------+------------+------------+ ABI/TBIToday's ABIToday's TBIPrevious ABIPrevious TBI +-------+-----------+-----------+------------+------------+ Right  .73        .85        .74         .64          +-------+-----------+-----------+------------+------------+ Left   .86        .94        .90         .86          +-------+-----------+-----------+------------+------------+ Bilateral TBIs appear increased compared to prior study on 09/01/2023. Right ABIs appear essentially unchanged compared to prior study on 09/01/2023. Left ABIs appear to be decreased compared to prior study on 09/01/2023.  Summary: Right: Resting right ankle-brachial index indicates moderate right lower extremity arterial disease. The right toe-brachial index is normal. Left: Resting left ankle-brachial index indicates mild left lower extremity arterial disease. The left toe-brachial index is normal. *See table(s) above for measurements and observations.  Electronically signed by Mikki Alexander MD on 12/06/2023 at 9:39:23 AM.    Final        Assessment & Plan:   1. Atherosclerosis of native artery of right lower extremity with rest pain (HCC) (Primary) Recommend:  The patient has evidence of severe atherosclerotic changes of both lower extremities with rest pain that is associated with preulcerative changes and impending tissue loss of the left foot.  This represents a limb threatening ischemia and places the patient at the risk for  limb loss.  Patient should undergo angiography of the left lower extremity with the hope for  intervention for limb salvage.  The risks and benefits as well as the alternative therapies was discussed in detail with the patient.  All questions were answered.  Patient agrees to proceed with left lower extremity angiography.  The patient will follow up with me in the office after the procedure.   The patient will also require premedication prior to procedure as she has had several reactions with hives following her procedure.     2. Essential hypertension Continue antihypertensive medications as already ordered, these medications have been reviewed and there are no changes at this time.  3. Diabetes mellitus due to underlying condition with hyperosmolarity without coma, with long-term current use of insulin  (HCC) Continue hypoglycemic medications as already ordered, these medications have been reviewed and there are no changes at this time.  Hgb A1C to be monitored as already arranged by primary service   Current  Outpatient Medications on File Prior to Visit  Medication Sig Dispense Refill   ACCU-CHEK GUIDE TEST test strip USE TO TEST BLOOD SUGAR 5 TIMES DAILY 450 each 0   Accu-Chek Softclix Lancets lancets Use 1 lancet 3 times daily to check glucose for diabetes 300 each 1   albuterol  (VENTOLIN  HFA) 108 (90 Base) MCG/ACT inhaler INHALE 2 PUFFS BY MOUTH EVERY 6 HOURS AS NEEDED FOR WHEEZING OR SHORTNESS OF BREATH 3 each 5   ALPRAZolam  (XANAX ) 0.25 MG tablet Take 1 tablet (0.25 mg total) by mouth 2 (two) times daily as needed for anxiety. (Patient taking differently: Take 0.25 mg by mouth 2 (two) times daily as needed for anxiety.) 60 tablet 2   aspirin  EC 81 MG tablet Take 1 tablet (81 mg total) by mouth daily. Swallow whole. 150 tablet 2   atorvastatin  (LIPITOR) 10 MG tablet Take 1 tablet (10 mg total) by mouth daily. 30 tablet 11   Blood Glucose Monitoring Suppl (ACCU-CHEK GUIDE) w/Device KIT Use as directed Dx e11.65 1 kit 0   calcium  carbonate (OSCAL) 1500 (600 Ca) MG TABS tablet  Take 600 mg of elemental calcium  by mouth 2 (two) times daily with a meal.     clopidogrel  (PLAVIX ) 75 MG tablet Take 1 tablet (75 mg total) by mouth daily. 90 tablet 3   Continuous Blood Gluc Receiver (DEXCOM G7 RECEIVER) DEVI Use one as directed for uncontrolled dm 1 each 0   Continuous Glucose Sensor (DEXCOM G7 SENSOR) MISC USE AS DIRECTED. CHANGE EVERY 10 DAYS. 3 each 0   cyclobenzaprine  (FLEXERIL ) 10 MG tablet Take 1 tablet (10 mg total) by mouth at bedtime. Take one tab po qhs for back spasm prn only 30 tablet 3   escitalopram  (LEXAPRO ) 10 MG tablet Take 1 tablet (10 mg total) by mouth daily. 90 tablet 1   ferrous sulfate  325 (65 FE) MG tablet Take 325 mg by mouth 2 (two) times daily with a meal.     furosemide  (LASIX ) 20 MG tablet Take 1 tablet (20 mg total) by mouth daily as needed. 30 tablet 5   gabapentin  (NEURONTIN ) 300 MG capsule TAKE 1 CAPSULE BY MOUTH THREE TIMES DAILY 90 capsule 0   insulin  glargine, 1 Unit Dial , (TOUJEO ) 300 UNIT/ML Solostar Pen Inject 10 Units into the skin daily. 1.5 mL 11   Insulin  Pen Needle (RELION PEN NEEDLES) 31G X 6 MM MISC USE 1 PEN NEEDLE WITH INSULIN  PEN ONCE DAILY FOR DIABETES 50 each 3   ipratropium-albuterol  (DUONEB) 0.5-2.5 (3) MG/3ML SOLN USE 1 AMPULE IN NEBULIZER EVERY 4 HOURS AS NEEDED 360 mL 0   lisinopril -hydrochlorothiazide  (ZESTORETIC ) 20-25 MG tablet Take 1 tablet by mouth daily. 90 tablet 3   metFORMIN  (GLUCOPHAGE -XR) 500 MG 24 hr tablet Take 2 tablets (1,000 mg total) by mouth daily with breakfast. 180 tablet 1   metoprolol  succinate (TOPROL -XL) 25 MG 24 hr tablet Take 1 tablet (25 mg total) by mouth daily. 90 tablet 3   mometasone -formoterol  (DULERA ) 200-5 MCG/ACT AERO Inhale 2 puffs by mouth twice daily 13 g 12   OXYGEN  Inhale 4 L into the lungs. PT USES [facility] FOR OXYGEN      potassium chloride  (KLOR-CON ) 10 MEQ tablet TAKE 1 TABLET BY MOUTH EVERY OTHER DAY 45 tablet 0   SPIRIVA  HANDIHALER 18 MCG inhalation capsule INHALE THE  CONTENTS OF 1 CAPSULES BY MOUTH ONCE DAILY - DO NOT SWALLOW CAPSULES 90 capsule 3   vitamin B-12 (CYANOCOBALAMIN ) 100 MCG tablet Take 100 mcg by  mouth daily.     VITAMIN D , CHOLECALCIFEROL , PO Take 1 tablet by mouth in the morning.     Ensifentrine  (OHTUVAYRE ) 3 MG/2.5ML SUSP Inhale 3 mg into the lungs in the morning and at bedtime. (Patient not taking: Reported on 12/23/2023) 150 mL 11   famotidine  (PEPCID ) 20 MG tablet Take 1 tablet (20 mg total) by mouth daily for 14 days. (Patient taking differently: Take 20 mg by mouth daily as needed for heartburn.) 14 tablet 0   methylPREDNISolone  (MEDROL  DOSEPAK) 4 MG TBPK tablet SMARTSIG:- Tablet(s) By Mouth - (Patient not taking: Reported on 01/05/2024)     No current facility-administered medications on file prior to visit.    There are no Patient Instructions on file for this visit. No follow-ups on file.   Tehilla Coffel E Naphtali Riede, NP

## 2024-01-10 NOTE — Progress Notes (Incomplete)
 Subjective:    [name], female    DOB: Jun 04, 1956, 68 y.o.   MRN: [id number] Chief Complaint  Patient presents with  . Follow-up    [facility] 3 week with ABI    HPI  Review of Systems     Objective:    Physical Exam  BP (!) 141/88   Pulse (!) 102   Resp 16   Wt 121 lb 3.2 oz (55 kg)   BMI 24.48 kg/m   Past Medical History:  Diagnosis Date  . Anemia   . Arthritis   . Asthma   . 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/2016  . Cancer [facility]) 2012   Right Lung CA  . COPD (chronic obstructive pulmonary disease) (HCC)   . Depression   . Diabetes mellitus without complication [facility])    Patient takes Janumet  . Essential hypertension 05/26/2016  . Heart failure (HCC) 2022  . Hydropneumothorax 05/03/2020  . Hypercholesteremia   . Oxygen  dependent    2L at nite   . PAD (peripheral artery disease) (HCC) 06/22/2016  . Peripheral vascular disease (HCC)   . Personal history of radiation therapy   . Shortness of breath dyspnea    with exertion   . Sialolithiasis   . Sleep apnea   . Wears dentures    full upper and lower    Social History   Socioeconomic History  . Marital status: Widowed    Spouse name: Not on file  . Number of children: Not on file  . Years of education: Not on file  . Highest education level: Not on file  Occupational History  . Not on file  Tobacco Use  . Smoking status: Former    Current packs/day: 0.00    Average packs/day: 1 pack/day for 37.0 years (37.0 ttl pk-yrs)    Types: Cigarettes    Start date: 02/06/1973    Quit date: 02/06/2010    Years since quitting: 13.9  . Smokeless tobacco: Former    Types: Snuff  Vaping Use  . Vaping status: Never Used  Substance and Sexual Activity  . Alcohol use: Not Currently    Alcohol/week: 5.0 standard drinks of alcohol    Types: 5 Cans of beer per week    Comment: /h x of alcohol abuse -stopped 2012- now drinks 5 beer per week  .  Drug use: Not Currently    Types: Marijuana, "Crack" cocaine, Cocaine    Comment: hx of cocaine use- last use 2015; last use marijuana6/22/19,  . Sexual activity: Yes  Other Topics Concern  . Not on file  Social History Narrative   Lives with Significant Other x 43 years   Social Drivers of Corporate investment banker Strain: Not on file  Food Insecurity: No Food Insecurity (06/15/2023)   Hunger Vital Sign   . Worried About Programme researcher, broadcasting/film/video in the Last Year: Never true   . Ran Out of Food in the Last Year: Never true  Transportation Needs: No Transportation Needs (06/15/2023)   PRAPARE - Transportation   . Lack of Transportation (Medical): No   . Lack of Transportation (Non-Medical): No  Physical Activity: Not on file  Stress: Not on file  Social Connections: Not on file  Intimate Partner Violence: Not At Risk (06/15/2023)   Humiliation, Afraid, Rape, and Kick questionnaire   . Fear of Current or Ex-Partner: No   . Emotionally Abused: No   . Physically Abused:  No   . Sexually Abused: No    Past Surgical History:  Procedure Laterality Date  . CESAREAN SECTION     x3  . COLONOSCOPY WITH PROPOFOL  N/A 06/25/2015   Procedure: COLONOSCOPY WITH PROPOFOL ;  Surgeon: Marnee Sink, MD;  Location: [facility] ENDOSCOPY;  Service: Endoscopy;  Laterality: N/A;  . COLONOSCOPY WITH PROPOFOL  N/A 07/26/2020   Procedure: COLONOSCOPY WITH PROPOFOL ;  Surgeon: Marnee Sink, MD;  Location: [facility];  Service: Endoscopy;  Laterality: N/A;  . CYST REMOVAL LEG     and on shoulder   . ESOPHAGOGASTRODUODENOSCOPY (EGD) WITH PROPOFOL  N/A 07/26/2020   Procedure: ESOPHAGOGASTRODUODENOSCOPY (EGD) WITH PROPOFOL ;  Surgeon: Marnee Sink, MD;  Location: [facility];  Service: Endoscopy;  Laterality: N/A;  Diabetic - oral meds  . LOWER EXTREMITY ANGIOGRAPHY Left 09/29/2018   Procedure: LOWER EXTREMITY ANGIOGRAPHY;  Surgeon: Celso College, MD;  Location: [facility] INVASIVE CV LAB;  Service:  Cardiovascular;  Laterality: Left;  . LOWER EXTREMITY ANGIOGRAPHY Left 07/29/2023   Procedure: Lower Extremity Angiography;  Surgeon: Celso College, MD;  Location: [facility] INVASIVE CV LAB;  Service: Cardiovascular;  Laterality: Left;  . LOWER EXTREMITY ANGIOGRAPHY Right 12/20/2023   Procedure: Lower Extremity Angiography;  Surgeon: Celso College, MD;  Location: [facility] INVASIVE CV LAB;  Service: Cardiovascular;  Laterality: Right;  . LUNG BIOPSY  12/30/2011   has lung "spots"  . PACEMAKER IMPLANT  07/14/2021  . PACEMAKER LEADLESS INSERTION N/A 07/14/2021   Procedure: PACEMAKER LEADLESS INSERTION;  Surgeon: Percival Brace, MD;  Location: [facility] CV LAB;  Service: Cardiovascular;  Laterality: N/A;  . PERIPHERAL VASCULAR CATHETERIZATION Left 06/01/2016   Procedure: Lower Extremity Angiography;  Surgeon: Celso College, MD;  Location: [facility] CV [facility];  Service: Cardiovascular;  Laterality: Left;  . PERIPHERAL VASCULAR CATHETERIZATION N/A 06/01/2016   Procedure: Abdominal Aortogram w/Lower Extremity;  Surgeon: Celso College, MD;  Location: [facility];  Service: Cardiovascular;  Laterality: N/A;  . PERIPHERAL VASCULAR CATHETERIZATION  06/01/2016   Procedure: Lower Extremity Intervention;  Surgeon: Celso College, MD;  Location: [facility];  Service: Cardiovascular;;  . PERIPHERAL VASCULAR CATHETERIZATION Right 06/08/2016   Procedure: Lower Extremity Angiography;  Surgeon: Celso College, MD;  Location: [facility];  Service: Cardiovascular;  Laterality: Right;  . PERIPHERAL VASCULAR CATHETERIZATION  06/08/2016   Procedure: Lower Extremity Intervention;  Surgeon: Celso College, MD;  Location: [facility] CV [facility];  Service: Cardiovascular;;  . SUBMANDIBULAR GLAND EXCISION Left 12/06/2020   Procedure: EXCISION SUBMANDIBULAR GLAND;  Surgeon: Lesly Raspberry, MD;  Location: [facility];  Service: ENT;  Laterality: Left;  needs to be first case Diabetic - diet controlled   . TEMPORARY PACEMAKER N/A 07/11/2021   Procedure: TEMPORARY PACEMAKER;  Surgeon: Percival Brace, MD;  Location: [facility] INVASIVE CV LAB;  Service: Cardiovascular;  Laterality: N/A;    Family History  Problem Relation Age of Onset  . Diabetes Mother   . Hypercholesterolemia Mother   . Lung cancer Father   . Diabetes Sister   . Diabetes Sister   . Hypertension Sister   . Diabetes Maternal Grandmother   . Diabetes Paternal Grandmother   . Heart attack Brother   . Coronary artery disease Brother   . Vascular Disease Brother   . Heart attack Brother   . Breast cancer Neg Hx     Allergies  Allergen Reactions  . Iodinated Contrast Media Itching  . Trelegy Ellipta  [Fluticasone -Umeclidin-Vilant]     Had  breathing issues       Latest Ref Rng & Units 10/08/2023   10:04 AM 10/04/2023   10:47 AM 07/06/2023   10:17 AM  CBC  WBC 4.0 - 10.5 K/uL 5.4  4.7  4.8   Hemoglobin 12.0 - 15.0 g/dL 16.1  09.6  9.4   Hematocrit 36.0 - 46.0 % 42.5  37.6  31.9   Platelets 150 - 400 K/uL 264  236  282        CMP     Component Value Date/Time   NA 144 10/04/2023 1047   NA 141 04/17/2014 1055   K 4.2 10/04/2023 1047   K 3.9 04/17/2014 1055   CL 99 10/04/2023 1047   CL 102 04/17/2014 1055   CO2 27 10/04/2023 1047   CO2 30 04/17/2014 1055   GLUCOSE 173 (H) 10/04/2023 1047   GLUCOSE 215 (H) 07/06/2023 1016   GLUCOSE 201 (H) 04/17/2014 1055   BUN 20 12/20/2023 0839   BUN 12 10/04/2023 1047   BUN 10 04/17/2014 1055   CREATININE 0.66 12/20/2023 0839   CREATININE 0.84 07/06/2023 1016   CREATININE 0.90 04/17/2014 1055   CALCIUM  9.4 10/04/2023 1047   CALCIUM  9.1 04/17/2014 1055   PROT 6.9 10/04/2023 1047   PROT 8.1 04/17/2014 1055   ALBUMIN 4.4 10/04/2023 1047   ALBUMIN 4.1 04/17/2014 1055   AST 22 10/04/2023 1047   AST 24 07/06/2023 1016   ALT 19 10/04/2023 1047   ALT 27 07/06/2023 1016   ALT 29 04/17/2014 1055   ALKPHOS 96 10/04/2023 1047   ALKPHOS 73 04/17/2014 1055   BILITOT  0.2 10/04/2023 1047   BILITOT 0.4 07/06/2023 1016   EGFR 97 10/04/2023 1047   GFRNONAA >60 12/20/2023 0839   GFRNONAA >60 07/06/2023 1016   GFRNONAA >60 04/17/2014 1055     VAS US  ABI WITH/WO TBI Result Date: 01/06/2024  LOWER EXTREMITY DOPPLER STUDY Patient Name:  SHELL BLANCHETTE  Date of Exam:   01/05/2024 Medical Rec #: [id number]     Accession #:    [id number] Date of Birth: 30-Aug-1955     Patient Gender: F Patient Age:   22 years [facility]am [facility] Procedure:      VAS US  ABI WITH/WO TBI Referring Phys: Reymundo Caulk DEW --------------------------------------------------------------------------------  Indications: Claudication, and peripheral artery disease. Other Factors: Left SFA stents are known to be occluded.                Patient complains of constant numbness in left foot.                 07/29/2023: Aortogram and selective Left Lower                ExtremityAngiogram. Mechanical thrombectomy of the Left SFA and                Popliteal Artery with the [place] Rex device. Stent placement to the                Proximal Left SFA with 6 mm diameter by 5 cm length Viabahn                stent. Stent placement to the Left Popliteal Artery above the                knee with 6 mm diameter by 5 cm length Viabahn stent.  12/20/2023: Aortogram and Selective Right Lower                ExtremityAngiogram. PTA of the Right SFA with 4 mm diameter by 22                cm length Lutonix drug coated angioplasty balloon. Stent                placement to the Right SFA with 6 mm by 20 cm length Life stent.  Vascular Interventions: 06/01/16: Left distal SFA/popliteal arteries PTAs with                         SFA stent x2;                         06/08/16: Left SFA PTA;                         09/29/18: Left SFA stent x2;. Comparison Study: 12/01/2023 Performing Technologist: Tonie Franks RVS  Examination Guidelines: A complete evaluation includes at minimum, Doppler waveform signals and  systolic blood pressure reading at the level of bilateral brachial, anterior tibial, and posterior tibial arteries, when vessel segments are accessible. Bilateral testing is considered an integral part of a complete examination. Photoelectric Plethysmograph (PPG) waveforms and toe systolic pressure readings are included as required and additional duplex testing as needed. Limited examinations for reoccurring indications may be performed as noted.  ABI Findings: +---------+------------------+-----+--------+--------+ Right    Rt Pressure (mmHg)IndexWaveformComment  +---------+------------------+-----+--------+--------+ Brachial 115                                     +---------+------------------+-----+--------+--------+ ATA      100               0.87 biphasic         +---------+------------------+-----+--------+--------+ PTA      105               0.91 biphasic         +---------+------------------+-----+--------+--------+ Great Toe108               0.94 Normal           +---------+------------------+-----+--------+--------+ +---------+------------------+-----+-------------------+-------+ Left     Lt Pressure (mmHg)IndexWaveform           Comment +---------+------------------+-----+-------------------+-------+ Brachial 114                                               +---------+------------------+-----+-------------------+-------+ ATA      43                0.37 dampened monophasic        +---------+------------------+-----+-------------------+-------+ PTA                             Non pulsatile flow         +---------+------------------+-----+-------------------+-------+ PERO     46                0.40 dampened monophasic        +---------+------------------+-----+-------------------+-------+ Carylon Claude  0.28                            +---------+------------------+-----+-------------------+-------+  +-------+-----------+-----------+------------+------------+ ABI/TBIToday's ABIToday's TBIPrevious ABIPrevious TBI +-------+-----------+-----------+------------+------------+ Right  .91        .94        .73         .85          +-------+-----------+-----------+------------+------------+ Left   .40        .28        .86         .94          +-------+-----------+-----------+------------+------------+ Right ABIs and TBIs appear increased compared to prior study on 12/01/2023. Left ABIs and TBIs appear decreased compared to prior study on 12/01/2023.  Summary: Right: Resting right ankle-brachial index indicates mild right lower extremity arterial disease. The right toe-brachial index is normal. Left: Resting left ankle-brachial index indicates severe left lower extremity arterial disease. The left toe-brachial index is abnormal. *See table(s) above for measurements and observations.  Electronically signed by Mikki Alexander MD on 01/06/2024 at 8:12:02 AM.    Final    VAS US  ABI WITH/WO TBI Result Date: 12/06/2023  LOWER EXTREMITY DOPPLER STUDY Patient Name:  ARANDA BIHM  Date of Exam:   12/01/2023 Medical Rec #: [id number]     Accession #:    [id number] Date of Birth: 10-03-1955     Patient Gender: F Patient Age:   76 years Exam Location:  [facility] Procedure:      VAS US  ABI WITH/WO TBI Referring Phys: Mikki Alexander --------------------------------------------------------------------------------  Indications: Claudication, and peripheral artery disease. Other Factors: Left SFA stents are known to be occluded.                Patient complains of constant numbness in left foot.                 07/29/2023: Aortogram and selective Left Lower                ExtremityAngiogram. Mechanical thrombectomy of the Left SFA and                Popliteal Artery with the [place] Rex device. Stent placement to the                Proximal Left SFA with 6 mm diameter by 5 cm length Viabahn                stent. Stent  placement to the Left Popliteal Artery above the                knee with 6 mm diameter by 5 cm length Viabahn stent.  Vascular Interventions: 06/01/16: Left distal SFA/popliteal arteries PTAs with                         SFA stent x2;                         06/08/16: Left SFA PTA;                         09/29/18: Left SFA stent x2;. Comparison Study: 09/01/2023 Performing Technologist: Tonie Franks RVS  Examination Guidelines: A complete evaluation includes at minimum, Doppler waveform signals and systolic blood pressure reading at the level of bilateral brachial, anterior tibial, and  posterior tibial arteries, when vessel segments are accessible. Bilateral testing is considered an integral part of a complete examination. Photoelectric Plethysmograph (PPG) waveforms and toe systolic pressure readings are included as required and additional duplex testing as needed. Limited examinations for reoccurring indications may be performed as noted.  ABI Findings: +---------+------------------+-----+--------+--------+ Right    Rt Pressure (mmHg)IndexWaveformComment  +---------+------------------+-----+--------+--------+ Brachial 125                                     +---------+------------------+-----+--------+--------+ ATA      88                0.70 biphasic         +---------+------------------+-----+--------+--------+ PTA      91                0.73 biphasic         +---------+------------------+-----+--------+--------+ Great Toe106               0.85 Normal           +---------+------------------+-----+--------+--------+ +---------+------------------+-----+--------+-------+ Left     Lt Pressure (mmHg)IndexWaveformComment +---------+------------------+-----+--------+-------+ Brachial 119                                    +---------+------------------+-----+--------+-------+ ATA      108               0.86 biphasic         +---------+------------------+-----+--------+-------+ PTA      99                0.79 biphasic        +---------+------------------+-----+--------+-------+ Darene Economy               0.94 Normal          +---------+------------------+-----+--------+-------+ +-------+-----------+-----------+------------+------------+ ABI/TBIToday's ABIToday's TBIPrevious ABIPrevious TBI +-------+-----------+-----------+------------+------------+ Right  .73        .85        .74         .64          +-------+-----------+-----------+------------+------------+ Left   .86        .94        .90         .86          +-------+-----------+-----------+------------+------------+ Bilateral TBIs appear increased compared to prior study on 09/01/2023. Right ABIs appear essentially unchanged compared to prior study on 09/01/2023. Left ABIs appear to be decreased compared to prior study on 09/01/2023.  Summary: Right: Resting right ankle-brachial index indicates moderate right lower extremity arterial disease. The right toe-brachial index is normal. Left: Resting left ankle-brachial index indicates mild left lower extremity arterial disease. The left toe-brachial index is normal. *See table(s) above for measurements and observations.  Electronically signed by Mikki Alexander MD on 12/06/2023 at 9:39:23 AM.    Final        Assessment & Plan:   1. Atherosclerosis of native artery of right lower extremity with rest pain (HCC) (Primary) Recommend:  The patient has evidence of severe atherosclerotic changes of both lower extremities with rest pain that is associated with preulcerative changes and impending tissue loss of the *** foot.  This represents a limb threatening ischemia and places the patient at the risk for *** limb loss.  Patient should undergo angiography of the *** lower extremity with the  hope for intervention for limb salvage.  The risks and benefits as well as the alternative therapies was discussed in  detail with the patient.  All questions were answered.  Patient agrees to proceed with *** lower extremity angiography.  The patient will follow up with me in the office after the procedure.      2. Essential hypertension Continue antihypertensive medications as already ordered, these medications have been reviewed and there are no changes at this time.  3. Diabetes mellitus due to underlying condition with hyperosmolarity without coma, with long-term current use of insulin  (HCC) Continue hypoglycemic medications as already ordered, these medications have been reviewed and there are no changes at this time.  Hgb A1C to be monitored as already arranged by primary service   Current Outpatient Medications on File Prior to Visit  Medication Sig Dispense Refill  . ACCU-CHEK GUIDE TEST test strip USE TO TEST BLOOD SUGAR 5 TIMES DAILY 450 each 0  . Accu-Chek Softclix Lancets lancets Use 1 lancet 3 times daily to check glucose for diabetes 300 each 1  . albuterol  (VENTOLIN  HFA) 108 (90 Base) MCG/ACT inhaler INHALE 2 PUFFS BY MOUTH EVERY 6 HOURS AS NEEDED FOR WHEEZING OR SHORTNESS OF BREATH 3 each 5  . ALPRAZolam  (XANAX ) 0.25 MG tablet Take 1 tablet (0.25 mg total) by mouth 2 (two) times daily as needed for anxiety. (Patient taking differently: Take 0.25 mg by mouth 2 (two) times daily as needed for anxiety.) 60 tablet 2  . aspirin  EC 81 MG tablet Take 1 tablet (81 mg total) by mouth daily. Swallow whole. 150 tablet 2  . atorvastatin  (LIPITOR) 10 MG tablet Take 1 tablet (10 mg total) by mouth daily. 30 tablet 11  . Blood Glucose Monitoring Suppl (ACCU-CHEK GUIDE) w/Device KIT Use as directed Dx e11.65 1 kit 0  . calcium  carbonate (OSCAL) 1500 (600 Ca) MG TABS tablet Take 600 mg of elemental calcium  by mouth 2 (two) times daily with a meal.    . clopidogrel  (PLAVIX ) 75 MG tablet Take 1 tablet (75 mg total) by mouth daily. 90 tablet 3  . Continuous Blood Gluc Receiver (DEXCOM G7 RECEIVER) DEVI Use one  as directed for uncontrolled dm 1 each 0  . Continuous Glucose Sensor (DEXCOM G7 SENSOR) MISC USE AS DIRECTED. CHANGE EVERY 10 DAYS. 3 each 0  . cyclobenzaprine  (FLEXERIL ) 10 MG tablet Take 1 tablet (10 mg total) by mouth at bedtime. Take one tab po qhs for back spasm prn only 30 tablet 3  . escitalopram  (LEXAPRO ) 10 MG tablet Take 1 tablet (10 mg total) by mouth daily. 90 tablet 1  . ferrous sulfate  325 (65 FE) MG tablet Take 325 mg by mouth 2 (two) times daily with a meal.    . furosemide  (LASIX ) 20 MG tablet Take 1 tablet (20 mg total) by mouth daily as needed. 30 tablet 5  . gabapentin  (NEURONTIN ) 300 MG capsule TAKE 1 CAPSULE BY MOUTH THREE TIMES DAILY 90 capsule 0  . insulin  glargine, 1 Unit Dial , (TOUJEO ) 300 UNIT/ML Solostar Pen Inject 10 Units into the skin daily. 1.5 mL 11  . Insulin  Pen Needle (RELION PEN NEEDLES) 31G X 6 MM MISC USE 1 PEN NEEDLE WITH INSULIN  PEN ONCE DAILY FOR DIABETES 50 each 3  . ipratropium-albuterol  (DUONEB) 0.5-2.5 (3) MG/3ML SOLN USE 1 AMPULE IN NEBULIZER EVERY 4 HOURS AS NEEDED 360 mL 0  . lisinopril -hydrochlorothiazide  (ZESTORETIC ) 20-25 MG tablet Take 1 tablet by mouth daily. 90 tablet 3  . metFORMIN  (  GLUCOPHAGE -XR) 500 MG 24 hr tablet Take 2 tablets (1,000 mg total) by mouth daily with breakfast. 180 tablet 1  . metoprolol  succinate (TOPROL -XL) 25 MG 24 hr tablet Take 1 tablet (25 mg total) by mouth daily. 90 tablet 3  . mometasone -formoterol  (DULERA ) 200-5 MCG/ACT AERO Inhale 2 puffs by mouth twice daily 13 g 12  . OXYGEN  Inhale 4 L into the lungs. PT USES [facility] FOR OXYGEN     . potassium chloride  (KLOR-CON ) 10 MEQ tablet TAKE 1 TABLET BY MOUTH EVERY OTHER DAY 45 tablet 0  . SPIRIVA  HANDIHALER 18 MCG inhalation capsule INHALE THE CONTENTS OF 1 CAPSULES BY MOUTH ONCE DAILY - DO NOT SWALLOW CAPSULES 90 capsule 3  . vitamin B-12 (CYANOCOBALAMIN ) 100 MCG tablet Take 100 mcg by mouth daily.    . VITAMIN D , CHOLECALCIFEROL , PO Take 1 tablet by mouth in the  morning.    . Ensifentrine  (OHTUVAYRE ) 3 MG/2.5ML SUSP Inhale 3 mg into the lungs in the morning and at bedtime. (Patient not taking: Reported on 12/23/2023) 150 mL 11  . famotidine  (PEPCID ) 20 MG tablet Take 1 tablet (20 mg total) by mouth daily for 14 days. (Patient taking differently: Take 20 mg by mouth daily as needed for heartburn.) 14 tablet 0  . methylPREDNISolone  (MEDROL  DOSEPAK) 4 MG TBPK tablet SMARTSIG:- Tablet(s) By Mouth - (Patient not taking: Reported on 01/05/2024)     No current facility-administered medications on file prior to visit.    There are no Patient Instructions on file for this visit. No follow-ups on file.   Enya Bureau E Robynne Roat, NP

## 2024-01-10 NOTE — H&P (View-Only) (Signed)
 Subjective:    [name], female    DOB: [birth year], 68 y.o.   MRN: [id number] Chief Complaint  Patient presents with   Follow-up    [facility] 3 week with ABI    The patient returns to the office for followup and review status post angiogram with intervention on 12/20/2023.   Procedure: Procedure(s) Performed:             1.  Ultrasound guidance for vascular access left femoral artery             2.  Catheter placement into right common femoral artery from left femoral approach             3.  Aortogram and selective right lower extremity angiogram             4.  Percutaneous transluminal angioplasty of right SFA with 4 mm diameter by 22 cm length Lutonix drug-coated angioplasty balloon             5.  Stent placement to the right SFA with 6 mm diameter by 20 cm length life stent             6.  StarClose closure device left femoral artery   The patient notes improvement in the lower extremity symptoms.  Of the right lower extremity.  She still continues to have neuropathy but is little bit better.  Her concern however is that her opposite foot is much more painful and she notes that it feels cold.  She also had significant itching and a rash following her most recent angiogram.  There have been no significant changes to the patient's overall health care.  No documented history of amaurosis fugax or recent TIA symptoms. There are no recent neurological changes noted. No documented history of DVT, PE or superficial thrombophlebitis. The patient denies recent episodes of angina or shortness of breath.   ABI's Rt=0.91 and Lt=0.40  (previous ABI's Rt=0.73 and Lt=0.86) Duplex US  of the right lower extremity reveals biphasic with good toe waveforms however dampened monophasic flow in the left    Review of Systems  Neurological:  Positive for numbness.  All other systems reviewed and are negative.      Objective:    Physical Exam Vitals reviewed.  HENT:     Head:  Normocephalic.  Cardiovascular:     Rate and Rhythm: Normal rate.     Pulses: Normal pulses.  Pulmonary:     Effort: Pulmonary effort is normal.  Skin:    General: Skin is warm and dry.  Neurological:     Mental Status: She is alert and oriented to person, place, and time.  Psychiatric:        Mood and Affect: Mood normal.        Behavior: Behavior normal.        Thought Content: Thought content normal.        Judgment: Judgment normal.     BP (!) 141/88   Pulse (!) 102   Resp 16   Wt 121 lb 3.2 oz (55 kg)   BMI 24.48 kg/m   Past Medical History:  Diagnosis Date   Anemia    Arthritis    Asthma    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/2016   Cancer (HCC) 2012   Right Lung CA   COPD (chronic obstructive pulmonary disease) (HCC)    Depression    Diabetes mellitus without complication (HCC)  Patient takes Janumet   Essential hypertension 05/26/2016   Heart failure (HCC) 2022   Hydropneumothorax 05/03/2020   Hypercholesteremia    Oxygen  dependent    2L at nite    PAD (peripheral artery disease) (HCC) 06/22/2016   Peripheral vascular disease (HCC)    Personal history of radiation therapy    Shortness of breath dyspnea    with exertion    Sialolithiasis    Sleep apnea    Wears dentures    full upper and lower    Social History   Socioeconomic History   Marital status: Widowed    Spouse name: Not on file   Number of children: Not on file   Years of education: Not on file   Highest education level: Not on file  Occupational History   Not on file  Tobacco Use   Smoking status: Former    Current packs/day: 0.00    Average packs/day: 1 pack/day for 37.0 years (37.0 ttl pk-yrs)    Types: Cigarettes    Start date: 02/06/1973    Quit date: 02/06/2010    Years since quitting: 13.9   Smokeless tobacco: Former    Types: Snuff  Vaping Use   Vaping status: Never Used  Substance and Sexual Activity   Alcohol  use: Not Currently    Alcohol/week: 5.0 standard drinks of alcohol    Types: 5 Cans of beer per week    Comment: /h x of alcohol abuse -stopped 2012- now drinks 5 beer per week   Drug use: Not Currently    Types: Marijuana, "Crack" cocaine, Cocaine    Comment: hx of cocaine use- last use 2015; last use marijuana6/22/19,   Sexual activity: Yes  Other Topics Concern   Not on file  Social History Narrative   Lives with Significant Other x 43 years   Social Drivers of Corporate investment banker Strain: Not on file  Food Insecurity: No Food Insecurity (06/15/2023)   Hunger Vital Sign    Worried About Running Out of Food in the Last Year: Never true    Ran Out of Food in the Last Year: Never true  Transportation Needs: No Transportation Needs (06/15/2023)   PRAPARE - Administrator, Civil Service (Medical): No    Lack of Transportation (Non-Medical): No  Physical Activity: Not on file  Stress: Not on file  Social Connections: Not on file  Intimate Partner Violence: Not At Risk (06/15/2023)   Humiliation, Afraid, Rape, and Kick questionnaire    Fear of Current or Ex-Partner: No    Emotionally Abused: No    Physically Abused: No    Sexually Abused: No    Past Surgical History:  Procedure Laterality Date   CESAREAN SECTION     x3   COLONOSCOPY WITH PROPOFOL  N/A 06/25/2015   Procedure: COLONOSCOPY WITH PROPOFOL ;  Surgeon: Marnee Sink, MD;  Location: [facility];  Service: Endoscopy;  Laterality: N/A;   COLONOSCOPY WITH PROPOFOL  N/A 07/26/2020   Procedure: COLONOSCOPY WITH PROPOFOL ;  Surgeon: Marnee Sink, MD;  Location: [facility];  Service: Endoscopy;  Laterality: N/A;   CYST REMOVAL LEG     and on shoulder    ESOPHAGOGASTRODUODENOSCOPY (EGD) WITH PROPOFOL  N/A 07/26/2020   Procedure: ESOPHAGOGASTRODUODENOSCOPY (EGD) WITH PROPOFOL ;  Surgeon: Marnee Sink, MD;  Location: [facility];  Service: Endoscopy;  Laterality: N/A;  Diabetic - oral meds    LOWER EXTREMITY ANGIOGRAPHY Left 09/29/2018   Procedure: LOWER EXTREMITY ANGIOGRAPHY;  Surgeon: Mikki Alexander  S, MD;  Location: [facility];  Service: Cardiovascular;  Laterality: Left;   LOWER EXTREMITY ANGIOGRAPHY Left 07/29/2023   Procedure: Lower Extremity Angiography;  Surgeon: Celso College, MD;  Location: [facility];  Service: Cardiovascular;  Laterality: Left;   LOWER EXTREMITY ANGIOGRAPHY Right 12/20/2023   Procedure: Lower Extremity Angiography;  Surgeon: Celso College, MD;  Location: [facility];  Service: Cardiovascular;  Laterality: Right;   LUNG BIOPSY  12/30/2011   has lung "spots"   PACEMAKER IMPLANT  07/14/2021   PACEMAKER LEADLESS INSERTION N/A 07/14/2021   Procedure: PACEMAKER LEADLESS INSERTION;  Surgeon: Percival Brace, MD;  Location: [facility];  Service: Cardiovascular;  Laterality: N/A;   PERIPHERAL VASCULAR CATHETERIZATION Left 06/01/2016   Procedure: Lower Extremity Angiography;  Surgeon: Celso College, MD;  Location: [facility];  Service: Cardiovascular;  Laterality: Left;   PERIPHERAL VASCULAR CATHETERIZATION N/A 06/01/2016   Procedure: Abdominal Aortogram w/Lower Extremity;  Surgeon: Celso College, MD;  Location: [facility];  Service: Cardiovascular;  Laterality: N/A;   PERIPHERAL VASCULAR CATHETERIZATION  06/01/2016   Procedure: Lower Extremity Intervention;  Surgeon: Celso College, MD;  Location: [facility];  Service: Cardiovascular;;   PERIPHERAL VASCULAR CATHETERIZATION Right 06/08/2016   Procedure: Lower Extremity Angiography;  Surgeon: Celso College, MD;  Location: [facility];  Service: Cardiovascular;  Laterality: Right;   PERIPHERAL VASCULAR CATHETERIZATION  06/08/2016   Procedure: Lower Extremity Intervention;  Surgeon: Celso College, MD;  Location: [facility];  Service: Cardiovascular;;   SUBMANDIBULAR GLAND EXCISION Left 12/06/2020   Procedure: EXCISION SUBMANDIBULAR GLAND;   Surgeon: Lesly Raspberry, MD;  Location: [facility];  Service: ENT;  Laterality: Left;  needs to be first case Diabetic - diet controlled   TEMPORARY PACEMAKER N/A 07/11/2021   Procedure: TEMPORARY PACEMAKER;  Surgeon: Percival Brace, MD;  Location: [facility];  Service: Cardiovascular;  Laterality: N/A;    Family History  Problem Relation Age of Onset   Diabetes Mother    Hypercholesterolemia Mother    Lung cancer Father    Diabetes Sister    Diabetes Sister    Hypertension Sister    Diabetes Maternal Grandmother    Diabetes Paternal Grandmother    Heart attack Brother    Coronary artery disease Brother    Vascular Disease Brother    Heart attack Brother    Breast cancer Neg Hx     Allergies  Allergen Reactions   Iodinated Contrast Media Itching   Trelegy Ellipta  [Fluticasone -Umeclidin-Vilant]     Had breathing issues       Latest Ref Rng & Units 10/08/2023   10:04 AM 10/04/2023   10:47 AM 07/06/2023   10:17 AM  CBC  WBC 4.0 - 10.5 K/uL 5.4  4.7  4.8   Hemoglobin 12.0 - 15.0 g/dL 19.1  47.8  9.4   Hematocrit 36.0 - 46.0 % 42.5  37.6  31.9   Platelets 150 - 400 K/uL 264  236  282        CMP     Component Value Date/Time   NA 144 10/04/2023 1047   NA 141 04/17/2014 1055   K 4.2 10/04/2023 1047   K 3.9 04/17/2014 1055   CL 99 10/04/2023 1047   CL 102 04/17/2014 1055   CO2 27 10/04/2023 1047   CO2 30 04/17/2014 1055   GLUCOSE 173 (H) 10/04/2023 1047   GLUCOSE  215 (H) 07/06/2023 1016   GLUCOSE 201 (H) 04/17/2014 1055   BUN 20 12/20/2023 0839   BUN 12 10/04/2023 1047   BUN 10 04/17/2014 1055   CREATININE 0.66 12/20/2023 0839   CREATININE 0.84 07/06/2023 1016   CREATININE 0.90 04/17/2014 1055   CALCIUM  9.4 10/04/2023 1047   CALCIUM  9.1 04/17/2014 1055   PROT 6.9 10/04/2023 1047   PROT 8.1 04/17/2014 1055   ALBUMIN 4.4 10/04/2023 1047   ALBUMIN 4.1 04/17/2014 1055   AST 22 10/04/2023 1047   AST 24 07/06/2023 1016   ALT 19  10/04/2023 1047   ALT 27 07/06/2023 1016   ALT 29 04/17/2014 1055   ALKPHOS 96 10/04/2023 1047   ALKPHOS 73 04/17/2014 1055   BILITOT 0.2 10/04/2023 1047   BILITOT 0.4 07/06/2023 1016   EGFR 97 10/04/2023 1047   GFRNONAA >60 12/20/2023 0839   GFRNONAA >60 07/06/2023 1016   GFRNONAA >60 04/17/2014 1055     VAS US  ABI WITH/WO TBI Result Date: 01/06/2024  LOWER EXTREMITY DOPPLER STUDY Patient Name:  TERICKA DEVINCENZI  Date of Exam:   01/05/2024 Medical Rec #: [id number]     Accession #:    [id number] Date of Birth: Oct 07, 1955     Patient Gender: F Patient Age:   23 years Exam Location:  [facility] Procedure:      VAS US  ABI WITH/WO TBI Referring Phys: Reymundo Caulk DEW --------------------------------------------------------------------------------  Indications: Claudication, and peripheral artery disease. Other Factors: Left SFA stents are known to be occluded.                Patient complains of constant numbness in left foot.                 07/29/2023: Aortogram and selective Left Lower                ExtremityAngiogram. Mechanical thrombectomy of the Left SFA and                Popliteal Artery with the [place] Rex device. Stent placement to the                Proximal Left SFA with 6 mm diameter by 5 cm length Viabahn                stent. Stent placement to the Left Popliteal Artery above the                knee with 6 mm diameter by 5 cm length Viabahn stent.                 12/20/2023: Aortogram and Selective Right Lower                ExtremityAngiogram. PTA of the Right SFA with 4 mm diameter by 22                cm length Lutonix drug coated angioplasty balloon. Stent                placement to the Right SFA with 6 mm by 20 cm length Life stent.  Vascular Interventions: 06/01/16: Left distal SFA/popliteal arteries PTAs with                         SFA stent x2;                         06/08/16: Left SFA  PTA;                         09/29/18: Left SFA stent x2;. Comparison Study: 12/01/2023  Performing Technologist: Tonie Franks RVS  Examination Guidelines: A complete evaluation includes at minimum, Doppler waveform signals and systolic blood pressure reading at the level of bilateral brachial, anterior tibial, and posterior tibial arteries, when vessel segments are accessible. Bilateral testing is considered an integral part of a complete examination. Photoelectric Plethysmograph (PPG) waveforms and toe systolic pressure readings are included as required and additional duplex testing as needed. Limited examinations for reoccurring indications may be performed as noted.  ABI Findings: +---------+------------------+-----+--------+--------+ Right    Rt Pressure (mmHg)IndexWaveformComment  +---------+------------------+-----+--------+--------+ Brachial 115                                     +---------+------------------+-----+--------+--------+ ATA      100               0.87 biphasic         +---------+------------------+-----+--------+--------+ PTA      105               0.91 biphasic         +---------+------------------+-----+--------+--------+ Great Toe108               0.94 Normal           +---------+------------------+-----+--------+--------+ +---------+------------------+-----+-------------------+-------+ Left     Lt Pressure (mmHg)IndexWaveform           Comment +---------+------------------+-----+-------------------+-------+ Brachial 114                                               +---------+------------------+-----+-------------------+-------+ ATA      43                0.37 dampened monophasic        +---------+------------------+-----+-------------------+-------+ PTA                             Non pulsatile flow         +---------+------------------+-----+-------------------+-------+ PERO     46                0.40 dampened monophasic        +---------+------------------+-----+-------------------+-------+ Great Toe32                 0.28                            +---------+------------------+-----+-------------------+-------+ +-------+-----------+-----------+------------+------------+ ABI/TBIToday's ABIToday's TBIPrevious ABIPrevious TBI +-------+-----------+-----------+------------+------------+ Right  .91        .94        .73         .85          +-------+-----------+-----------+------------+------------+ Left   .40        .28        .86         .94          +-------+-----------+-----------+------------+------------+ Right ABIs and TBIs appear increased compared to prior study on 12/01/2023. Left ABIs and TBIs appear decreased compared to prior study on 12/01/2023.  Summary: Right: Resting right ankle-brachial index indicates mild right lower  extremity arterial disease. The right toe-brachial index is normal. Left: Resting left ankle-brachial index indicates severe left lower extremity arterial disease. The left toe-brachial index is abnormal. *See table(s) above for measurements and observations.  Electronically signed by Mikki Alexander MD on 01/06/2024 at 8:12:02 AM.    Final    VAS US  ABI WITH/WO TBI Result Date: 12/06/2023  LOWER EXTREMITY DOPPLER STUDY Patient Name:  LEEANA CREER  Date of Exam:   12/01/2023 Medical Rec #: [id number]     Accession #:    [id number] Date of Birth: 1956/02/29     Patient Gender: F Patient Age:   52 years Exam Location:  [facility] Procedure:      VAS US  ABI WITH/WO TBI Referring Phys: Mikki Alexander --------------------------------------------------------------------------------  Indications: Claudication, and peripheral artery disease. Other Factors: Left SFA stents are known to be occluded.                Patient complains of constant numbness in left foot.                 07/29/2023: Aortogram and selective Left Lower                ExtremityAngiogram. Mechanical thrombectomy of the Left SFA and                Popliteal Artery with the [place] Rex device. Stent placement to  the                Proximal Left SFA with 6 mm diameter by 5 cm length Viabahn                stent. Stent placement to the Left Popliteal Artery above the                knee with 6 mm diameter by 5 cm length Viabahn stent.  Vascular Interventions: 06/01/16: Left distal SFA/popliteal arteries PTAs with                         SFA stent x2;                         06/08/16: Left SFA PTA;                         09/29/18: Left SFA stent x2;. Comparison Study: 09/01/2023 Performing Technologist: Tonie Franks RVS  Examination Guidelines: A complete evaluation includes at minimum, Doppler waveform signals and systolic blood pressure reading at the level of bilateral brachial, anterior tibial, and posterior tibial arteries, when vessel segments are accessible. Bilateral testing is considered an integral part of a complete examination. Photoelectric Plethysmograph (PPG) waveforms and toe systolic pressure readings are included as required and additional duplex testing as needed. Limited examinations for reoccurring indications may be performed as noted.  ABI Findings: +---------+------------------+-----+--------+--------+ Right    Rt Pressure (mmHg)IndexWaveformComment  +---------+------------------+-----+--------+--------+ Brachial 125                                     +---------+------------------+-----+--------+--------+ ATA      88                0.70 biphasic         +---------+------------------+-----+--------+--------+ PTA      91  0.73 biphasic         +---------+------------------+-----+--------+--------+ Great Toe106               0.85 Normal           +---------+------------------+-----+--------+--------+ +---------+------------------+-----+--------+-------+ Left     Lt Pressure (mmHg)IndexWaveformComment +---------+------------------+-----+--------+-------+ Brachial 119                                     +---------+------------------+-----+--------+-------+ ATA      108               0.86 biphasic        +---------+------------------+-----+--------+-------+ PTA      99                0.79 biphasic        +---------+------------------+-----+--------+-------+ Darene Economy               0.94 Normal          +---------+------------------+-----+--------+-------+ +-------+-----------+-----------+------------+------------+ ABI/TBIToday's ABIToday's TBIPrevious ABIPrevious TBI +-------+-----------+-----------+------------+------------+ Right  .73        .85        .74         .64          +-------+-----------+-----------+------------+------------+ Left   .86        .94        .90         .86          +-------+-----------+-----------+------------+------------+ Bilateral TBIs appear increased compared to prior study on 09/01/2023. Right ABIs appear essentially unchanged compared to prior study on 09/01/2023. Left ABIs appear to be decreased compared to prior study on 09/01/2023.  Summary: Right: Resting right ankle-brachial index indicates moderate right lower extremity arterial disease. The right toe-brachial index is normal. Left: Resting left ankle-brachial index indicates mild left lower extremity arterial disease. The left toe-brachial index is normal. *See table(s) above for measurements and observations.  Electronically signed by Mikki Alexander MD on 12/06/2023 at 9:39:23 AM.    Final        Assessment & Plan:   1. Atherosclerosis of native artery of right lower extremity with rest pain (HCC) (Primary) Recommend:  The patient has evidence of severe atherosclerotic changes of both lower extremities with rest pain that is associated with preulcerative changes and impending tissue loss of the left foot.  This represents a limb threatening ischemia and places the patient at the risk for  limb loss.  Patient should undergo angiography of the left lower extremity with the hope for  intervention for limb salvage.  The risks and benefits as well as the alternative therapies was discussed in detail with the patient.  All questions were answered.  Patient agrees to proceed with left lower extremity angiography.  The patient will follow up with me in the office after the procedure.   The patient will also require premedication prior to procedure as she has had several reactions with hives following her procedure.     2. Essential hypertension Continue antihypertensive medications as already ordered, these medications have been reviewed and there are no changes at this time.  3. Diabetes mellitus due to underlying condition with hyperosmolarity without coma, with long-term current use of insulin  (HCC) Continue hypoglycemic medications as already ordered, these medications have been reviewed and there are no changes at this time.  Hgb A1C to be monitored as already arranged by primary service   Current  Outpatient Medications on File Prior to Visit  Medication Sig Dispense Refill   ACCU-CHEK GUIDE TEST test strip USE TO TEST BLOOD SUGAR 5 TIMES DAILY 450 each 0   Accu-Chek Softclix Lancets lancets Use 1 lancet 3 times daily to check glucose for diabetes 300 each 1   albuterol  (VENTOLIN  HFA) 108 (90 Base) MCG/ACT inhaler INHALE 2 PUFFS BY MOUTH EVERY 6 HOURS AS NEEDED FOR WHEEZING OR SHORTNESS OF BREATH 3 each 5   ALPRAZolam  (XANAX ) 0.25 MG tablet Take 1 tablet (0.25 mg total) by mouth 2 (two) times daily as needed for anxiety. (Patient taking differently: Take 0.25 mg by mouth 2 (two) times daily as needed for anxiety.) 60 tablet 2   aspirin  EC 81 MG tablet Take 1 tablet (81 mg total) by mouth daily. Swallow whole. 150 tablet 2   atorvastatin  (LIPITOR) 10 MG tablet Take 1 tablet (10 mg total) by mouth daily. 30 tablet 11   Blood Glucose Monitoring Suppl (ACCU-CHEK GUIDE) w/Device KIT Use as directed Dx e11.65 1 kit 0   calcium  carbonate (OSCAL) 1500 (600 Ca) MG TABS tablet  Take 600 mg of elemental calcium  by mouth 2 (two) times daily with a meal.     clopidogrel  (PLAVIX ) 75 MG tablet Take 1 tablet (75 mg total) by mouth daily. 90 tablet 3   Continuous Blood Gluc Receiver (DEXCOM G7 RECEIVER) DEVI Use one as directed for uncontrolled dm 1 each 0   Continuous Glucose Sensor (DEXCOM G7 SENSOR) MISC USE AS DIRECTED. CHANGE EVERY 10 DAYS. 3 each 0   cyclobenzaprine  (FLEXERIL ) 10 MG tablet Take 1 tablet (10 mg total) by mouth at bedtime. Take one tab po qhs for back spasm prn only 30 tablet 3   escitalopram  (LEXAPRO ) 10 MG tablet Take 1 tablet (10 mg total) by mouth daily. 90 tablet 1   ferrous sulfate  325 (65 FE) MG tablet Take 325 mg by mouth 2 (two) times daily with a meal.     furosemide  (LASIX ) 20 MG tablet Take 1 tablet (20 mg total) by mouth daily as needed. 30 tablet 5   gabapentin  (NEURONTIN ) 300 MG capsule TAKE 1 CAPSULE BY MOUTH THREE TIMES DAILY 90 capsule 0   insulin  glargine, 1 Unit Dial , (TOUJEO ) 300 UNIT/ML Solostar Pen Inject 10 Units into the skin daily. 1.5 mL 11   Insulin  Pen Needle (RELION PEN NEEDLES) 31G X 6 MM MISC USE 1 PEN NEEDLE WITH INSULIN  PEN ONCE DAILY FOR DIABETES 50 each 3   ipratropium-albuterol  (DUONEB) 0.5-2.5 (3) MG/3ML SOLN USE 1 AMPULE IN NEBULIZER EVERY 4 HOURS AS NEEDED 360 mL 0   lisinopril -hydrochlorothiazide  (ZESTORETIC ) 20-25 MG tablet Take 1 tablet by mouth daily. 90 tablet 3   metFORMIN  (GLUCOPHAGE -XR) 500 MG 24 hr tablet Take 2 tablets (1,000 mg total) by mouth daily with breakfast. 180 tablet 1   metoprolol  succinate (TOPROL -XL) 25 MG 24 hr tablet Take 1 tablet (25 mg total) by mouth daily. 90 tablet 3   mometasone -formoterol  (DULERA ) 200-5 MCG/ACT AERO Inhale 2 puffs by mouth twice daily 13 g 12   OXYGEN  Inhale 4 L into the lungs. PT USES [employer] FOR OXYGEN      potassium chloride  (KLOR-CON ) 10 MEQ tablet TAKE 1 TABLET BY MOUTH EVERY OTHER DAY 45 tablet 0   SPIRIVA  HANDIHALER 18 MCG inhalation capsule INHALE THE  CONTENTS OF 1 CAPSULES BY MOUTH ONCE DAILY - DO NOT SWALLOW CAPSULES 90 capsule 3   vitamin B-12 (CYANOCOBALAMIN ) 100 MCG tablet Take 100 mcg by  mouth daily.     VITAMIN D , CHOLECALCIFEROL , PO Take 1 tablet by mouth in the morning.     Ensifentrine  (OHTUVAYRE ) 3 MG/2.5ML SUSP Inhale 3 mg into the lungs in the morning and at bedtime. (Patient not taking: Reported on 12/23/2023) 150 mL 11   famotidine  (PEPCID ) 20 MG tablet Take 1 tablet (20 mg total) by mouth daily for 14 days. (Patient taking differently: Take 20 mg by mouth daily as needed for heartburn.) 14 tablet 0   methylPREDNISolone  (MEDROL  DOSEPAK) 4 MG TBPK tablet SMARTSIG:- Tablet(s) By Mouth - (Patient not taking: Reported on 01/05/2024)     No current facility-administered medications on file prior to visit.    There are no Patient Instructions on file for this visit. No follow-ups on file.   Tehilla Coffel E Naphtali Riede, NP

## 2024-01-11 ENCOUNTER — Encounter (INDEPENDENT_AMBULATORY_CARE_PROVIDER_SITE_OTHER)

## 2024-01-11 ENCOUNTER — Ambulatory Visit (INDEPENDENT_AMBULATORY_CARE_PROVIDER_SITE_OTHER): Admitting: Nurse Practitioner

## 2024-01-11 ENCOUNTER — Other Ambulatory Visit: Payer: Self-pay | Admitting: Nurse Practitioner

## 2024-01-11 DIAGNOSIS — F419 Anxiety disorder, unspecified: Secondary | ICD-10-CM

## 2024-01-12 ENCOUNTER — Encounter: Payer: Self-pay | Admitting: Nurse Practitioner

## 2024-01-12 ENCOUNTER — Ambulatory Visit (INDEPENDENT_AMBULATORY_CARE_PROVIDER_SITE_OTHER): Payer: 59 | Admitting: Nurse Practitioner

## 2024-01-12 VITALS — BP 113/68 | HR 90 | Temp 98.5°F | Resp 16 | Ht 59.0 in | Wt 120.4 lb

## 2024-01-12 DIAGNOSIS — E1169 Type 2 diabetes mellitus with other specified complication: Secondary | ICD-10-CM | POA: Diagnosis not present

## 2024-01-12 DIAGNOSIS — J449 Chronic obstructive pulmonary disease, unspecified: Secondary | ICD-10-CM

## 2024-01-12 DIAGNOSIS — J9611 Chronic respiratory failure with hypoxia: Secondary | ICD-10-CM | POA: Diagnosis not present

## 2024-01-12 DIAGNOSIS — Z794 Long term (current) use of insulin: Secondary | ICD-10-CM

## 2024-01-12 DIAGNOSIS — L720 Epidermal cyst: Secondary | ICD-10-CM

## 2024-01-12 DIAGNOSIS — F419 Anxiety disorder, unspecified: Secondary | ICD-10-CM

## 2024-01-12 LAB — POCT GLYCOSYLATED HEMOGLOBIN (HGB A1C): Hemoglobin A1C: 8 % — AB (ref 4.0–5.6)

## 2024-01-12 MED ORDER — ALPRAZOLAM 0.5 MG PO TABS: 0.5000 mg | ORAL_TABLET | Freq: Two times a day (BID) | ORAL | 2 refills | Status: DC | PRN

## 2024-01-12 NOTE — Progress Notes (Signed)
 [facility] [address] [place], [place] [address]  Internal MEDICINE  Office Visit Note  Patient Name: Angela Jensen  [id number]  [id number]  Date of Service: 01/12/2024  Chief Complaint  Patient presents with   Follow-up   Hypertension   Hyperlipidemia   Depression   Diabetes   Medication Refill    HPI Angela Jensen presents for a follow-up visit for diabetes, epidermoid cyst on back, anxiety, COPD and respiratory failure. Diabetes -- A1c is elevated but improving from previous. Currently taking toujeo  insulin  and metformin  XR Epidermoid cyst on her back, wants to have this removed.  Anxiety -- takes alprazolam  as needed.  Has received new neb machine and it is working well.  COPD and chronic respiratory failure with hypoxia -- uses supplemental oxygen  continuously, breathing is stable today. Uses inhalers as prescribed.     Current Medication: Outpatient Encounter Medications as of 01/12/2024  Medication Sig Note   ACCU-CHEK GUIDE TEST test strip USE TO TEST BLOOD SUGAR 5 TIMES DAILY    Accu-Chek Softclix Lancets lancets Use 1 lancet 3 times daily to check glucose for diabetes    albuterol  (VENTOLIN  HFA) 108 (90 Base) MCG/ACT inhaler INHALE 2 PUFFS BY MOUTH EVERY 6 HOURS AS NEEDED FOR WHEEZING OR SHORTNESS OF BREATH    ALPRAZolam  (XANAX ) 0.5 MG tablet Take 1 tablet (0.5 mg total) by mouth 2 (two) times daily as needed for anxiety.    aspirin  EC 81 MG tablet Take 1 tablet (81 mg total) by mouth daily. Swallow whole.    atorvastatin  (LIPITOR) 10 MG tablet Take 1 tablet (10 mg total) by mouth daily.    Blood Glucose Monitoring Suppl (ACCU-CHEK GUIDE) w/Device KIT Use as directed Dx e11.65    calcium  carbonate (OSCAL) 1500 (600 Ca) MG TABS tablet Take 600 mg of elemental calcium  by mouth 2 (two) times daily with a meal.    clopidogrel  (PLAVIX ) 75 MG tablet Take 1 tablet (75 mg total) by mouth daily.    Continuous Blood Gluc Receiver (DEXCOM G7 RECEIVER) DEVI Use one as  directed for uncontrolled dm    Continuous Glucose Sensor (DEXCOM G7 SENSOR) MISC USE AS DIRECTED. CHANGE EVERY 10 DAYS.    cyclobenzaprine  (FLEXERIL ) 10 MG tablet Take 1 tablet (10 mg total) by mouth at bedtime. Take one tab po qhs for back spasm prn only 12/23/2023: Taking prn   escitalopram  (LEXAPRO ) 10 MG tablet Take 1 tablet (10 mg total) by mouth daily.    ferrous sulfate  325 (65 FE) MG tablet Take 325 mg by mouth 2 (two) times daily with a meal.    furosemide  (LASIX ) 20 MG tablet Take 1 tablet (20 mg total) by mouth daily as needed.    insulin  glargine, 1 Unit Dial , (TOUJEO ) 300 UNIT/ML Solostar Pen Inject 10 Units into the skin daily. 12/23/2023: Taking differently: 8-10 units daily   Insulin  Pen Needle (RELION PEN NEEDLES) 31G X 6 MM MISC USE 1 PEN NEEDLE WITH INSULIN  PEN ONCE DAILY FOR DIABETES    ipratropium-albuterol  (DUONEB) 0.5-2.5 (3) MG/3ML SOLN USE 1 AMPULE IN NEBULIZER EVERY 4 HOURS AS NEEDED    mometasone -formoterol  (DULERA ) 200-5 MCG/ACT AERO Inhale 2 puffs by mouth twice daily    OXYGEN  Inhale 4 L into the lungs. PT USES [facility] FOR OXYGEN  12/23/2023: Taking 3 L   potassium chloride  (KLOR-CON ) 10 MEQ tablet TAKE 1 TABLET BY MOUTH EVERY OTHER DAY    SPIRIVA  HANDIHALER 18 MCG inhalation capsule INHALE THE CONTENTS OF 1 CAPSULES BY  MOUTH ONCE DAILY - DO NOT SWALLOW CAPSULES    vitamin B-12 (CYANOCOBALAMIN ) 100 MCG tablet Take 100 mcg by mouth daily.    VITAMIN D , CHOLECALCIFEROL , PO Take 1 tablet by mouth in the morning.    [DISCONTINUED] ALPRAZolam  (XANAX ) 0.25 MG tablet Take 1 tablet (0.25 mg total) by mouth 2 (two) times daily as needed for anxiety. (Patient taking differently: Take 0.25 mg by mouth 2 (two) times daily as needed for anxiety.)    [DISCONTINUED] Ensifentrine  (OHTUVAYRE ) 3 MG/2.5ML SUSP Inhale 3 mg into the lungs in the morning and at bedtime. (Patient not taking: Reported on 01/17/2024)    [DISCONTINUED] gabapentin  (NEURONTIN ) 300 MG capsule TAKE 1 CAPSULE BY MOUTH  THREE TIMES DAILY    [DISCONTINUED] lisinopril -hydrochlorothiazide  (ZESTORETIC ) 20-25 MG tablet Take 1 tablet by mouth daily.    [DISCONTINUED] metFORMIN  (GLUCOPHAGE -XR) 500 MG 24 hr tablet Take 2 tablets (1,000 mg total) by mouth daily with breakfast.    [DISCONTINUED] methylPREDNISolone  (MEDROL  DOSEPAK) 4 MG TBPK tablet  12/23/2023: New prescription - patient was updated that the prescription was sent as she was not aware    [DISCONTINUED] metoprolol  succinate (TOPROL -XL) 25 MG 24 hr tablet Take 1 tablet (25 mg total) by mouth daily.    famotidine  (PEPCID ) 20 MG tablet Take 1 tablet (20 mg total) by mouth daily for 14 days. (Patient taking differently: Take 20 mg by mouth daily as needed for heartburn.)    No facility-administered encounter medications on file as of 01/12/2024.    Surgical History: Past Surgical History:  Procedure Laterality Date   APPLICATION OF CELL SAVER Left 01/21/2024   Procedure: APPLICATION OF CELL SAVER;  Surgeon: Marea Selinda RAMAN, MD;  Location: [facility];  Service: Vascular;  Laterality: Left;   CESAREAN SECTION     x3   COLONOSCOPY WITH PROPOFOL  N/A 06/25/2015   Procedure: COLONOSCOPY WITH PROPOFOL ;  Surgeon: Rogelia Copping, MD;  Location: [facility];  Service: Endoscopy;  Laterality: N/A;   COLONOSCOPY WITH PROPOFOL  N/A 07/26/2020   Procedure: COLONOSCOPY WITH PROPOFOL ;  Surgeon: Copping Rogelia, MD;  Location: [facility];  Service: Endoscopy;  Laterality: N/A;   CYST REMOVAL LEG     and on shoulder    ENDARTERECTOMY FEMORAL Left 01/21/2024   Procedure: LEFT COMMON, SUPERFICIAL FEMORAL AND PROFUNDA FEMORIS ENDARTERECTOMY WITH BOVINE PERICARDIAL PATCH ANGIOPLASTY;  Surgeon: Marea Selinda RAMAN, MD;  Location: [facility];  Service: Vascular;  Laterality: Left;   ESOPHAGOGASTRODUODENOSCOPY (EGD) WITH PROPOFOL  N/A 07/26/2020   Procedure: ESOPHAGOGASTRODUODENOSCOPY (EGD) WITH PROPOFOL ;  Surgeon: Copping Rogelia, MD;  Location: [facility];  Service: Endoscopy;  Laterality:  N/A;  Diabetic - oral meds   INSERTION OF ILIAC STENT Left 01/21/2024   Procedure: INSERTION, STENT, ARTERY, ILIAC;  Surgeon: Marea Selinda RAMAN, MD;  Location: [facility];  Service: Vascular;  Laterality: Left;  AND SFA STENTS   LOWER EXTREMITY ANGIOGRAPHY Left 09/29/2018   Procedure: LOWER EXTREMITY ANGIOGRAPHY;  Surgeon: Marea Selinda RAMAN, MD;  Location: [facility];  Service: Cardiovascular;  Laterality: Left;   LOWER EXTREMITY ANGIOGRAPHY Left 07/29/2023   Procedure: Lower Extremity Angiography;  Surgeon: Marea Selinda RAMAN, MD;  Location: [facility];  Service: Cardiovascular;  Laterality: Left;   LOWER EXTREMITY ANGIOGRAPHY Right 12/20/2023   Procedure: Lower Extremity Angiography;  Surgeon: Marea Selinda RAMAN, MD;  Location: [facility];  Service: Cardiovascular;  Laterality: Right;   LOWER EXTREMITY ANGIOGRAPHY Left 01/17/2024   Procedure: Lower Extremity Angiography;  Surgeon: Marea Selinda RAMAN, MD;  Location: [facility];  Service: Cardiovascular;  Laterality: Left;   LUNG BIOPSY  12/30/2011   has lung spots   PACEMAKER IMPLANT  07/14/2021   PACEMAKER LEADLESS INSERTION N/A 07/14/2021   Procedure: PACEMAKER LEADLESS INSERTION;  Surgeon: Ammon Blunt, MD;  Location: [facility];  Service: Cardiovascular;  Laterality: N/A;   PERIPHERAL VASCULAR CATHETERIZATION Left 06/01/2016   Procedure: Lower Extremity Angiography;  Surgeon: Selinda GORMAN Gu, MD;  Location: [facility];  Service: Cardiovascular;  Laterality: Left;   PERIPHERAL VASCULAR CATHETERIZATION N/A 06/01/2016   Procedure: Abdominal Aortogram w/Lower Extremity;  Surgeon: Selinda GORMAN Gu, MD;  Location: [facility];  Service: Cardiovascular;  Laterality: N/A;   PERIPHERAL VASCULAR CATHETERIZATION  06/01/2016   Procedure: Lower Extremity Intervention;  Surgeon: Selinda GORMAN Gu, MD;  Location: [facility];  Service: Cardiovascular;;   PERIPHERAL VASCULAR CATHETERIZATION Right 06/08/2016    Procedure: Lower Extremity Angiography;  Surgeon: Selinda GORMAN Gu, MD;  Location: [facility];  Service: Cardiovascular;  Laterality: Right;   PERIPHERAL VASCULAR CATHETERIZATION  06/08/2016   Procedure: Lower Extremity Intervention;  Surgeon: Selinda GORMAN Gu, MD;  Location: [facility];  Service: Cardiovascular;;   SUBMANDIBULAR GLAND EXCISION Left 12/06/2020   Procedure: EXCISION SUBMANDIBULAR GLAND;  Surgeon: Herminio Miu, MD;  Location: [facility];  Service: ENT;  Laterality: Left;  needs to be first case Diabetic - diet controlled   TEMPORARY PACEMAKER N/A 07/11/2021   Procedure: TEMPORARY PACEMAKER;  Surgeon: Ammon Blunt, MD;  Location: [facility];  Service: Cardiovascular;  Laterality: N/A;    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/2016   Cancer (HCC) 2012   Right Lung CA   COPD (chronic obstructive pulmonary disease) (HCC)    Depression    Diabetes mellitus without complication (HCC)    Patient takes Janumet   Essential hypertension 05/26/2016   Heart failure (HCC) 2022   Hydropneumothorax 05/03/2020   Hypercholesteremia    Oxygen  dependent    2L at nite    PAD (peripheral artery disease) (HCC) 06/22/2016   Peripheral vascular disease (HCC)    Personal history of radiation therapy    Shortness of breath dyspnea    with exertion    Sialolithiasis    Sleep apnea    Wears dentures    full upper and lower    Family History: Family History  Problem Relation Age of Onset   Diabetes Mother    Hypercholesterolemia Mother    Lung cancer Father    Diabetes Sister    Diabetes Sister    Hypertension Sister    Diabetes Maternal Grandmother    Diabetes Paternal Grandmother    Heart attack Brother    Coronary artery disease Brother    Vascular Disease Brother    Heart attack Brother     Breast cancer Neg Hx     Social History   Socioeconomic History   Marital status: Widowed    Spouse name: Not on file   Number of children: Not on file   Years of education: Not on file   Highest education level: Not on file  Occupational History   Not on file  Tobacco Use   Smoking status: Former    Current packs/day: 0.00    Average packs/day: 1 pack/day for 37.0 years (  37.0 ttl pk-yrs)    Types: Cigarettes    Start date: 02/06/1973    Quit date: 02/06/2010    Years since quitting: 14.0   Smokeless tobacco: Former    Types: Snuff  Vaping Use   Vaping status: Never Used  Substance and Sexual Activity   Alcohol use: Not Currently    Alcohol/week: 5.0 standard drinks of alcohol    Types: 5 Cans of beer per week    Comment: /h x of alcohol abuse -stopped 2012- now drinks 5 beer per week   Drug use: Not Currently    Types: Marijuana, Crack cocaine, Cocaine    Comment: hx of cocaine use- last use 2015; last use marijuana6/22/19,   Sexual activity: Yes  Other Topics Concern   Not on file  Social History Narrative   Lives with Significant Other x 43 years   Social Drivers of Corporate investment banker Strain: Not on file  Food Insecurity: No Food Insecurity (01/17/2024)   Hunger Vital Sign    Worried About Running Out of Food in the Last Year: Never true    Ran Out of Food in the Last Year: Never true  Transportation Needs: No Transportation Needs (01/17/2024)   PRAPARE - Administrator, Civil Service (Medical): No    Lack of Transportation (Non-Medical): No  Physical Activity: Not on file  Stress: Not on file  Social Connections: Socially Integrated (01/17/2024)   Social Connection and Isolation Panel    Frequency of Communication with Friends and Family: More than three times a week    Frequency of Social Gatherings with Friends and Family: More than three times a week    Attends Religious Services: 1 to 4 times per year    Active Member of [employer] or  Organizations: No    Attends Engineer, structural: 1 to 4 times per year    Marital Status: Married  Catering manager Violence: Not At Risk (01/17/2024)   Humiliation, Afraid, Rape, and Kick questionnaire    Fear of Current or Ex-Partner: No    Emotionally Abused: No    Physically Abused: No    Sexually Abused: No      Review of Systems  Constitutional:  Negative for chills, fatigue and unexpected weight change.  HENT:  Negative for congestion, rhinorrhea, sneezing and sore throat.   Eyes:  Negative for redness.  Respiratory:  Positive for cough (intermittent), shortness of breath (intermittent) and wheezing (intermittent). Negative for chest tightness.   Cardiovascular: Negative.  Negative for chest pain and palpitations.  Gastrointestinal: Negative.  Negative for abdominal pain, constipation, diarrhea, nausea and vomiting.  Genitourinary:  Negative for dysuria and frequency.  Musculoskeletal:  Positive for arthralgias and back pain. Negative for joint swelling and neck pain.  Skin:  Negative for rash.  Neurological: Negative.  Negative for tremors and numbness.  Hematological:  Negative for adenopathy. Does not bruise/bleed easily.  Psychiatric/Behavioral:  Negative for behavioral problems (Depression), sleep disturbance and suicidal ideas. The patient is not nervous/anxious.     Vital Signs: BP 113/68   Pulse 90   Temp 98.5 F (36.9 C)   Resp 16   Ht 4' 11 (1.499 m)   Wt 120 lb 6.4 oz (54.6 kg)   SpO2 95%   BMI 24.32 kg/m    Physical Exam Vitals reviewed.  Constitutional:      General: She is not in acute distress.    Appearance: Normal appearance. She is normal weight. She is  not ill-appearing.  HENT:     Head: Normocephalic and atraumatic.   Eyes:     Pupils: Pupils are equal, round, and reactive to light.    Cardiovascular:     Rate and Rhythm: Normal rate and regular rhythm.  Pulmonary:     Effort: Pulmonary effort is normal. No respiratory  distress.     Breath sounds: Normal breath sounds. No wheezing.   Neurological:     Mental Status: She is alert and oriented to person, place, and time.   Psychiatric:        Mood and Affect: Mood normal.        Behavior: Behavior normal.        Assessment/Plan: 1. Chronic respiratory failure with hypoxia (HCC) (Primary) Continue supplemental oxygen  use and continue inhalers and nebulizer as prescribed.   2. Chronic obstructive pulmonary disease, unspecified COPD type (HCC) Continue supplemental oxygen  use and continue inhalers and nebulizer as prescribed.   3. Type 2 diabetes mellitus with other specified complication, with long-term current use of insulin  (HCC) A1c is improving, continue current medications as prescribed and continue diabetic diet.  - POCT HgB A1C  4. Epidermoid cyst of skin of back Referred to dermatology  - Ambulatory referral to Dermatology  5. Anxiety Continue alprazolam  as prescribed. Follow up in 3 months for additional refills.  - ALPRAZolam  (XANAX ) 0.5 MG tablet; Take 1 tablet (0.5 mg total) by mouth 2 (two) times daily as needed for anxiety.  Dispense: 60 tablet; Refill: 2   General Counseling: Angela Jensen verbalizes understanding of the findings of todays visit and agrees with plan of treatment. I have discussed any further diagnostic evaluation that may be needed or ordered today. We also reviewed her medications today. she has been encouraged to call the office with any questions or concerns that should arise related to todays visit.    Orders Placed This Encounter  Procedures   Ambulatory referral to Dermatology   POCT HgB A1C    Meds ordered this encounter  Medications   ALPRAZolam  (XANAX ) 0.5 MG tablet    Sig: Take 1 tablet (0.5 mg total) by mouth 2 (two) times daily as needed for anxiety.    Dispense:  60 tablet    Refill:  2    Fill new script today, note increased dose.    Return in about 12 weeks (around 04/05/2024) for F/U, anxiety  med refill, Rockney Grenz PCP and need AWV after 08/11/24.   Total time spent:30 Minutes Time spent includes review of chart, medications, test results, and follow up plan with the patient.   [place] Controlled Substance Database was reviewed by me.  This patient was seen by Mardy Maxin, FNP-C in collaboration with Dr. Sigrid Bathe as a part of collaborative care agreement.   Evamae Rowen R. Maxin, MSN, FNP-C Internal medicine

## 2024-01-13 ENCOUNTER — Telehealth: Payer: Self-pay | Admitting: Nurse Practitioner

## 2024-01-13 NOTE — Telephone Encounter (Signed)
 Awaiting 01/12/24 office notes for Dermatology referral-Toni

## 2024-01-14 ENCOUNTER — Other Ambulatory Visit: Payer: Self-pay | Admitting: Nurse Practitioner

## 2024-01-14 DIAGNOSIS — E1169 Type 2 diabetes mellitus with other specified complication: Secondary | ICD-10-CM

## 2024-01-16 DIAGNOSIS — J441 Chronic obstructive pulmonary disease with (acute) exacerbation: Secondary | ICD-10-CM | POA: Diagnosis not present

## 2024-01-17 ENCOUNTER — Inpatient Hospital Stay
Admission: RE | Admit: 2024-01-17 | Discharge: 2024-02-02 | DRG: 270 | Disposition: A | Discharge status: Home Health | Attending: Vascular Surgery | Admitting: Vascular Surgery

## 2024-01-17 ENCOUNTER — Other Ambulatory Visit: Payer: Self-pay

## 2024-01-17 ENCOUNTER — Encounter
Admission: RE | Disposition: A | Discharge status: Home Health | Payer: Self-pay | Source: Home / Self Care | Attending: Vascular Surgery

## 2024-01-17 DIAGNOSIS — Z9889 Other specified postprocedural states: Secondary | ICD-10-CM | POA: Diagnosis not present

## 2024-01-17 DIAGNOSIS — I998 Other disorder of circulatory system: Principal | ICD-10-CM | POA: Diagnosis present

## 2024-01-17 DIAGNOSIS — Z7951 Long term (current) use of inhaled steroids: Secondary | ICD-10-CM

## 2024-01-17 DIAGNOSIS — Z95811 Presence of heart assist device: Secondary | ICD-10-CM

## 2024-01-17 DIAGNOSIS — J9621 Acute and chronic respiratory failure with hypoxia: Secondary | ICD-10-CM | POA: Diagnosis not present

## 2024-01-17 DIAGNOSIS — I10 Essential (primary) hypertension: Secondary | ICD-10-CM | POA: Diagnosis not present

## 2024-01-17 DIAGNOSIS — I509 Heart failure, unspecified: Secondary | ICD-10-CM | POA: Diagnosis not present

## 2024-01-17 DIAGNOSIS — J439 Emphysema, unspecified: Secondary | ICD-10-CM | POA: Diagnosis not present

## 2024-01-17 DIAGNOSIS — Z7902 Long term (current) use of antithrombotics/antiplatelets: Secondary | ICD-10-CM

## 2024-01-17 DIAGNOSIS — Y842 Radiological procedure and radiotherapy as the cause of abnormal reaction of the patient, or of later complication, without mention of misadventure at the time of the procedure: Secondary | ICD-10-CM | POA: Diagnosis present

## 2024-01-17 DIAGNOSIS — I495 Sick sinus syndrome: Secondary | ICD-10-CM | POA: Diagnosis not present

## 2024-01-17 DIAGNOSIS — R062 Wheezing: Secondary | ICD-10-CM | POA: Diagnosis not present

## 2024-01-17 DIAGNOSIS — Z87891 Personal history of nicotine dependence: Secondary | ICD-10-CM | POA: Diagnosis not present

## 2024-01-17 DIAGNOSIS — J9811 Atelectasis: Secondary | ICD-10-CM | POA: Diagnosis not present

## 2024-01-17 DIAGNOSIS — Y831 Surgical operation with implant of artificial internal device as the cause of abnormal reaction of the patient, or of later complication, without mention of misadventure at the time of the procedure: Secondary | ICD-10-CM | POA: Diagnosis present

## 2024-01-17 DIAGNOSIS — J441 Chronic obstructive pulmonary disease with (acute) exacerbation: Secondary | ICD-10-CM | POA: Diagnosis not present

## 2024-01-17 DIAGNOSIS — I70222 Atherosclerosis of native arteries of extremities with rest pain, left leg: Secondary | ICD-10-CM | POA: Diagnosis not present

## 2024-01-17 DIAGNOSIS — D62 Acute posthemorrhagic anemia: Secondary | ICD-10-CM | POA: Diagnosis not present

## 2024-01-17 DIAGNOSIS — I2510 Atherosclerotic heart disease of native coronary artery without angina pectoris: Secondary | ICD-10-CM | POA: Diagnosis present

## 2024-01-17 DIAGNOSIS — J9610 Chronic respiratory failure, unspecified whether with hypoxia or hypercapnia: Secondary | ICD-10-CM | POA: Diagnosis not present

## 2024-01-17 DIAGNOSIS — J9622 Acute and chronic respiratory failure with hypercapnia: Secondary | ICD-10-CM | POA: Diagnosis present

## 2024-01-17 DIAGNOSIS — Z85118 Personal history of other malignant neoplasm of bronchus and lung: Secondary | ICD-10-CM

## 2024-01-17 DIAGNOSIS — Z7982 Long term (current) use of aspirin: Secondary | ICD-10-CM

## 2024-01-17 DIAGNOSIS — Z95820 Peripheral vascular angioplasty status with implants and grafts: Secondary | ICD-10-CM | POA: Diagnosis not present

## 2024-01-17 DIAGNOSIS — T82868A Thrombosis of vascular prosthetic devices, implants and grafts, initial encounter: Principal | ICD-10-CM | POA: Diagnosis present

## 2024-01-17 DIAGNOSIS — Z91041 Radiographic dye allergy status: Secondary | ICD-10-CM

## 2024-01-17 DIAGNOSIS — Z794 Long term (current) use of insulin: Secondary | ICD-10-CM | POA: Diagnosis not present

## 2024-01-17 DIAGNOSIS — I739 Peripheral vascular disease, unspecified: Secondary | ICD-10-CM | POA: Diagnosis not present

## 2024-01-17 DIAGNOSIS — I2489 Other forms of acute ischemic heart disease: Secondary | ICD-10-CM | POA: Diagnosis not present

## 2024-01-17 DIAGNOSIS — J432 Centrilobular emphysema: Secondary | ICD-10-CM | POA: Diagnosis present

## 2024-01-17 DIAGNOSIS — J701 Chronic and other pulmonary manifestations due to radiation: Secondary | ICD-10-CM | POA: Diagnosis not present

## 2024-01-17 DIAGNOSIS — I5042 Chronic combined systolic (congestive) and diastolic (congestive) heart failure: Secondary | ICD-10-CM | POA: Diagnosis present

## 2024-01-17 DIAGNOSIS — J449 Chronic obstructive pulmonary disease, unspecified: Secondary | ICD-10-CM | POA: Diagnosis not present

## 2024-01-17 DIAGNOSIS — E7800 Pure hypercholesterolemia, unspecified: Secondary | ICD-10-CM | POA: Diagnosis not present

## 2024-01-17 DIAGNOSIS — I743 Embolism and thrombosis of arteries of the lower extremities: Secondary | ICD-10-CM | POA: Diagnosis present

## 2024-01-17 DIAGNOSIS — Z950 Presence of cardiac pacemaker: Secondary | ICD-10-CM

## 2024-01-17 DIAGNOSIS — Z9981 Dependence on supplemental oxygen: Secondary | ICD-10-CM

## 2024-01-17 DIAGNOSIS — R7989 Other specified abnormal findings of blood chemistry: Secondary | ICD-10-CM | POA: Diagnosis not present

## 2024-01-17 DIAGNOSIS — I70223 Atherosclerosis of native arteries of extremities with rest pain, bilateral legs: Secondary | ICD-10-CM | POA: Diagnosis present

## 2024-01-17 DIAGNOSIS — G4733 Obstructive sleep apnea (adult) (pediatric): Secondary | ICD-10-CM | POA: Diagnosis present

## 2024-01-17 DIAGNOSIS — R9431 Abnormal electrocardiogram [ECG] [EKG]: Secondary | ICD-10-CM | POA: Diagnosis not present

## 2024-01-17 DIAGNOSIS — I110 Hypertensive heart disease with heart failure: Secondary | ICD-10-CM | POA: Diagnosis present

## 2024-01-17 DIAGNOSIS — E1165 Type 2 diabetes mellitus with hyperglycemia: Secondary | ICD-10-CM

## 2024-01-17 DIAGNOSIS — E1151 Type 2 diabetes mellitus with diabetic peripheral angiopathy without gangrene: Secondary | ICD-10-CM | POA: Diagnosis not present

## 2024-01-17 DIAGNOSIS — R0602 Shortness of breath: Secondary | ICD-10-CM | POA: Diagnosis not present

## 2024-01-17 DIAGNOSIS — I70229 Atherosclerosis of native arteries of extremities with rest pain, unspecified extremity: Secondary | ICD-10-CM

## 2024-01-17 DIAGNOSIS — I700 Atherosclerosis of aorta: Secondary | ICD-10-CM | POA: Diagnosis not present

## 2024-01-17 DIAGNOSIS — I959 Hypotension, unspecified: Secondary | ICD-10-CM | POA: Diagnosis not present

## 2024-01-17 DIAGNOSIS — Z7984 Long term (current) use of oral hypoglycemic drugs: Secondary | ICD-10-CM

## 2024-01-17 DIAGNOSIS — C3431 Malignant neoplasm of lower lobe, right bronchus or lung: Secondary | ICD-10-CM | POA: Diagnosis present

## 2024-01-17 DIAGNOSIS — I70212 Atherosclerosis of native arteries of extremities with intermittent claudication, left leg: Secondary | ICD-10-CM | POA: Diagnosis not present

## 2024-01-17 DIAGNOSIS — R042 Hemoptysis: Secondary | ICD-10-CM | POA: Diagnosis not present

## 2024-01-17 DIAGNOSIS — R918 Other nonspecific abnormal finding of lung field: Secondary | ICD-10-CM | POA: Diagnosis not present

## 2024-01-17 DIAGNOSIS — Z01810 Encounter for preprocedural cardiovascular examination: Secondary | ICD-10-CM | POA: Diagnosis not present

## 2024-01-17 DIAGNOSIS — I5032 Chronic diastolic (congestive) heart failure: Secondary | ICD-10-CM | POA: Diagnosis not present

## 2024-01-17 DIAGNOSIS — J9620 Acute and chronic respiratory failure, unspecified whether with hypoxia or hypercapnia: Secondary | ICD-10-CM | POA: Diagnosis not present

## 2024-01-17 DIAGNOSIS — I5030 Unspecified diastolic (congestive) heart failure: Secondary | ICD-10-CM | POA: Diagnosis not present

## 2024-01-17 DIAGNOSIS — Z79899 Other long term (current) drug therapy: Secondary | ICD-10-CM

## 2024-01-17 DIAGNOSIS — E785 Hyperlipidemia, unspecified: Secondary | ICD-10-CM | POA: Diagnosis not present

## 2024-01-17 HISTORY — PX: LOWER EXTREMITY ANGIOGRAPHY: CATH118251

## 2024-01-17 LAB — PROTIME-INR
INR: 1.1 (ref 0.8–1.2)
Prothrombin Time: 14 s (ref 11.4–15.2)

## 2024-01-17 LAB — HIV ANTIBODY (ROUTINE TESTING W REFLEX): HIV Screen 4th Generation wRfx: NONREACTIVE

## 2024-01-17 LAB — COMPREHENSIVE METABOLIC PANEL WITH GFR
ALT: 20 U/L (ref 0–44)
AST: 25 U/L (ref 15–41)
Albumin: 3.8 g/dL (ref 3.5–5.0)
Alkaline Phosphatase: 58 U/L (ref 38–126)
Anion gap: 11 (ref 5–15)
BUN: 20 mg/dL (ref 8–23)
CO2: 28 mmol/L (ref 22–32)
Calcium: 9.3 mg/dL (ref 8.9–10.3)
Chloride: 99 mmol/L (ref 98–111)
Creatinine, Ser: 0.76 mg/dL (ref 0.44–1.00)
GFR, Estimated: >60 mL/min (ref >60)
Glucose, Bld: 272 mg/dL — ABNORMAL HIGH (ref 70–99)
Potassium: 3.9 mmol/L (ref 3.5–5.1)
Sodium: 138 mmol/L (ref 135–145)
Total Bilirubin: 0.3 mg/dL (ref 0.0–1.2)
Total Protein: 7.1 g/dL (ref 6.5–8.1)

## 2024-01-17 LAB — GLUCOSE, CAPILLARY: Glucose-Capillary: 239 mg/dL — ABNORMAL HIGH (ref 70–99)

## 2024-01-17 LAB — CBC
HCT: 34.1 % — ABNORMAL LOW (ref 36.0–46.0)
Hemoglobin: 10.6 g/dL — ABNORMAL LOW (ref 12.0–15.0)
MCH: 28.7 pg (ref 26.0–34.0)
MCHC: 31.1 g/dL (ref 30.0–36.0)
MCV: 92.4 fL (ref 80.0–100.0)
Platelets: 239 10*3/uL (ref 150–400)
RBC: 3.69 MIL/uL — ABNORMAL LOW (ref 3.87–5.11)
RDW: 13.8 % (ref 11.5–15.5)
WBC: 8 10*3/uL (ref 4.0–10.5)
nRBC: 0 % (ref 0.0–0.2)

## 2024-01-17 LAB — CREATININE, SERUM
Creatinine, Ser: 0.73 mg/dL (ref 0.44–1.00)
GFR, Estimated: >60 mL/min (ref >60)

## 2024-01-17 LAB — BUN: BUN: 22 mg/dL (ref 8–23)

## 2024-01-17 LAB — HEPARIN LEVEL (UNFRACTIONATED): Heparin Unfractionated: 0.33 [IU]/mL (ref 0.30–0.70)

## 2024-01-17 SURGERY — LOWER EXTREMITY ANGIOGRAPHY
Anesthesia: Moderate Sedation | Site: Leg Lower | Laterality: Left

## 2024-01-17 MED ORDER — HEPARIN (PORCINE) 25000 UT/250ML-% IV SOLN
INTRAVENOUS | Status: AC
Start: 2024-01-17 — End: ?
  Filled 2024-01-17: qty 250

## 2024-01-17 MED ORDER — IPRATROPIUM BROMIDE 0.02 % IN SOLN: 0.5000 mg | Freq: Once | RESPIRATORY_TRACT | Status: DC

## 2024-01-17 MED ORDER — LABETALOL HCL 5 MG/ML IV SOLN
10.0000 mg | INTRAVENOUS | Status: DC | PRN
  Administered 2024-01-30: 10 mg via INTRAVENOUS
  Filled 2024-01-17: qty 4

## 2024-01-17 MED ORDER — SODIUM CHLORIDE 0.9 % IV SOLN: INTRAVENOUS | Status: DC

## 2024-01-17 MED ORDER — ATORVASTATIN CALCIUM 10 MG PO TABS
10.0000 mg | ORAL_TABLET | Freq: Every day | ORAL | Status: DC
  Administered 2024-01-18 – 2024-02-02 (×14): 10 mg via ORAL
  Filled 2024-01-17 (×14): qty 1

## 2024-01-17 MED ORDER — HEPARIN SODIUM (PORCINE) 1000 UNIT/ML IJ SOLN
INTRAMUSCULAR | Status: DC | PRN
Start: 2024-01-17 — End: 2024-01-17
  Administered 2024-01-17: 5000 [IU] via INTRAVENOUS

## 2024-01-17 MED ORDER — ONDANSETRON HCL 4 MG/2ML IJ SOLN: 4.0000 mg | Freq: Four times a day (QID) | INTRAMUSCULAR | Status: DC | PRN

## 2024-01-17 MED ORDER — MAGNESIUM HYDROXIDE 400 MG/5ML PO SUSP
30.0000 mL | Freq: Every day | ORAL | Status: DC | PRN
  Administered 2024-01-20 – 2024-01-25 (×2): 30 mL via ORAL
  Filled 2024-01-17 (×2): qty 30

## 2024-01-17 MED ORDER — IPRATROPIUM-ALBUTEROL 0.5-2.5 (3) MG/3ML IN SOLN
RESPIRATORY_TRACT | Status: AC
  Filled 2024-01-17: qty 3

## 2024-01-17 MED ORDER — LABETALOL HCL 5 MG/ML IV SOLN
INTRAVENOUS | Status: AC
  Filled 2024-01-17: qty 4

## 2024-01-17 MED ORDER — IODIXANOL 320 MG/ML IV SOLN
INTRAVENOUS | Status: DC | PRN
  Administered 2024-01-17: 45 mL

## 2024-01-17 MED ORDER — FERROUS SULFATE 325 (65 FE) MG PO TABS
325.0000 mg | ORAL_TABLET | Freq: Two times a day (BID) | ORAL | Status: DC
  Administered 2024-01-17 – 2024-02-02 (×29): 325 mg via ORAL
  Filled 2024-01-17 (×30): qty 1

## 2024-01-17 MED ORDER — SORBITOL 70 % SOLN
30.0000 mL | Freq: Every day | Status: DC | PRN
  Filled 2024-01-17 (×2): qty 30

## 2024-01-17 MED ORDER — HEPARIN (PORCINE) IN NACL 1000-0.9 UT/500ML-% IV SOLN
INTRAVENOUS | Status: DC | PRN
Start: 2024-01-17 — End: 2024-01-17
  Administered 2024-01-17: 1000 mL

## 2024-01-17 MED ORDER — IPRATROPIUM-ALBUTEROL 0.5-2.5 (3) MG/3ML IN SOLN
3.0000 mL | RESPIRATORY_TRACT | Status: AC
  Administered 2024-01-17: 3 mL via RESPIRATORY_TRACT

## 2024-01-17 MED ORDER — FAMOTIDINE 20 MG PO TABS
40.0000 mg | ORAL_TABLET | Freq: Once | ORAL | Status: AC | PRN
  Administered 2024-01-17: 40 mg via ORAL

## 2024-01-17 MED ORDER — HYDROCHLOROTHIAZIDE 25 MG PO TABS: 25.0000 mg | ORAL_TABLET | Freq: Every day | ORAL | Status: DC

## 2024-01-17 MED ORDER — HYDRALAZINE HCL 20 MG/ML IJ SOLN
INTRAMUSCULAR | Status: AC
  Filled 2024-01-17: qty 1

## 2024-01-17 MED ORDER — HYDROMORPHONE HCL 1 MG/ML IJ SOLN: 1.0000 mg | Freq: Once | INTRAMUSCULAR | Status: DC | PRN

## 2024-01-17 MED ORDER — FAMOTIDINE 20 MG PO TABS
ORAL_TABLET | ORAL | Status: AC
  Filled 2024-01-17: qty 2

## 2024-01-17 MED ORDER — POTASSIUM CHLORIDE CRYS ER 10 MEQ PO TBCR
10.0000 meq | EXTENDED_RELEASE_TABLET | ORAL | Status: DC
  Administered 2024-01-18 – 2024-01-20 (×2): 10 meq via ORAL
  Filled 2024-01-17 (×3): qty 1

## 2024-01-17 MED ORDER — METOPROLOL TARTRATE 5 MG/5ML IV SOLN: 2.0000 mg | INTRAVENOUS | Status: DC | PRN

## 2024-01-17 MED ORDER — LISINOPRIL-HYDROCHLOROTHIAZIDE 20-25 MG PO TABS: 1.0000 | ORAL_TABLET | Freq: Every day | ORAL | Status: DC

## 2024-01-17 MED ORDER — DIPHENHYDRAMINE HCL 25 MG PO CAPS
25.0000 mg | ORAL_CAPSULE | Freq: Four times a day (QID) | ORAL | Status: DC | PRN
  Administered 2024-01-17 – 2024-01-23 (×6): 25 mg via ORAL
  Filled 2024-01-17 (×8): qty 1

## 2024-01-17 MED ORDER — PANTOPRAZOLE SODIUM 40 MG PO TBEC
40.0000 mg | DELAYED_RELEASE_TABLET | Freq: Every day | ORAL | Status: DC
  Administered 2024-01-20 – 2024-02-02 (×13): 40 mg via ORAL
  Filled 2024-01-17 (×14): qty 1

## 2024-01-17 MED ORDER — GUAIFENESIN-DM 100-10 MG/5ML PO SYRP
15.0000 mL | ORAL_SOLUTION | ORAL | Status: AC | PRN
Start: 2024-01-17 — End: ?

## 2024-01-17 MED ORDER — CALCIUM CARBONATE 1250 (500 CA) MG PO TABS
500.0000 mg | ORAL_TABLET | Freq: Two times a day (BID) | ORAL | Status: DC
  Administered 2024-01-17 – 2024-01-19 (×4): 1250 mg via ORAL
  Administered 2024-01-20: 500 mg via ORAL
  Administered 2024-01-20 – 2024-01-27 (×12): 1250 mg via ORAL
  Administered 2024-01-27: 500 mg via ORAL
  Administered 2024-01-28 – 2024-02-02 (×11): 1250 mg via ORAL
  Filled 2024-01-17 (×31): qty 1

## 2024-01-17 MED ORDER — MIDAZOLAM HCL 2 MG/2ML IJ SOLN
INTRAMUSCULAR | Status: AC
  Filled 2024-01-17: qty 2

## 2024-01-17 MED ORDER — HYDRALAZINE HCL 20 MG/ML IJ SOLN
20.0000 mg | Freq: Once | INTRAMUSCULAR | Status: AC
  Administered 2024-01-17: 20 mg via INTRAVENOUS

## 2024-01-17 MED ORDER — POTASSIUM CHLORIDE CRYS ER 20 MEQ PO TBCR: 20.0000 meq | EXTENDED_RELEASE_TABLET | Freq: Once | ORAL | Status: DC

## 2024-01-17 MED ORDER — ESCITALOPRAM OXALATE 10 MG PO TABS
10.0000 mg | ORAL_TABLET | Freq: Every day | ORAL | Status: DC
  Administered 2024-01-18 – 2024-02-02 (×14): 10 mg via ORAL
  Filled 2024-01-17 (×14): qty 1

## 2024-01-17 MED ORDER — INSULIN GLARGINE 100 UNIT/ML ~~LOC~~ SOLN
10.0000 [IU] | Freq: Every day | SUBCUTANEOUS | Status: DC
  Administered 2024-01-18: 10 [IU] via SUBCUTANEOUS
  Filled 2024-01-17 (×2): qty 0.1

## 2024-01-17 MED ORDER — METOPROLOL SUCCINATE ER 25 MG PO TB24
25.0000 mg | ORAL_TABLET | Freq: Every day | ORAL | Status: DC
  Administered 2024-01-20 – 2024-01-28 (×7): 25 mg via ORAL
  Filled 2024-01-17 (×8): qty 1

## 2024-01-17 MED ORDER — CEFAZOLIN SODIUM-DEXTROSE 2-4 GM/100ML-% IV SOLN
2.0000 g | INTRAVENOUS | Status: AC
  Administered 2024-01-17: 2 g via INTRAVENOUS

## 2024-01-17 MED ORDER — HEPARIN (PORCINE) 25000 UT/250ML-% IV SOLN
650.0000 [IU]/h | INTRAVENOUS | Status: DC
  Administered 2024-01-17: 650 [IU]/h via INTRAVENOUS

## 2024-01-17 MED ORDER — IPRATROPIUM-ALBUTEROL 0.5-2.5 (3) MG/3ML IN SOLN
3.0000 mL | RESPIRATORY_TRACT | Status: DC | PRN
  Administered 2024-01-17 – 2024-01-27 (×11): 3 mL via RESPIRATORY_TRACT
  Filled 2024-01-17 (×11): qty 3

## 2024-01-17 MED ORDER — GABAPENTIN 300 MG PO CAPS
300.0000 mg | ORAL_CAPSULE | Freq: Three times a day (TID) | ORAL | Status: DC
  Administered 2024-01-17 – 2024-02-02 (×38): 300 mg via ORAL
  Filled 2024-01-17 (×41): qty 1

## 2024-01-17 MED ORDER — TIOTROPIUM BROMIDE MONOHYDRATE 18 MCG IN CAPS: 18.0000 ug | ORAL_CAPSULE | Freq: Every day | RESPIRATORY_TRACT | Status: DC

## 2024-01-17 MED ORDER — MORPHINE SULFATE (PF) 4 MG/ML IV SOLN
2.0000 mg | INTRAVENOUS | Status: DC | PRN
  Administered 2024-01-21 – 2024-01-24 (×12): 4 mg via INTRAVENOUS
  Administered 2024-01-25: 2 mg via INTRAVENOUS
  Filled 2024-01-17 (×13): qty 1

## 2024-01-17 MED ORDER — VITAMIN B-12 100 MCG PO TABS
100.0000 ug | ORAL_TABLET | Freq: Every day | ORAL | Status: DC
  Administered 2024-01-18 – 2024-02-02 (×14): 100 ug via ORAL
  Filled 2024-01-17 (×14): qty 1

## 2024-01-17 MED ORDER — MIDAZOLAM HCL 2 MG/2ML IJ SOLN
INTRAMUSCULAR | Status: DC | PRN
  Administered 2024-01-17: 1 mg via INTRAVENOUS

## 2024-01-17 MED ORDER — HEPARIN (PORCINE) 25000 UT/250ML-% IV SOLN
550.0000 [IU]/h | INTRAVENOUS | Status: DC
  Administered 2024-01-17: 550 [IU]/h via INTRAVENOUS

## 2024-01-17 MED ORDER — ONDANSETRON HCL 4 MG/2ML IJ SOLN
4.0000 mg | Freq: Four times a day (QID) | INTRAMUSCULAR | Status: DC | PRN
  Administered 2024-01-18 (×2): 4 mg via INTRAVENOUS
  Filled 2024-01-17 (×2): qty 2

## 2024-01-17 MED ORDER — ASPIRIN 81 MG PO TBEC
81.0000 mg | DELAYED_RELEASE_TABLET | Freq: Every day | ORAL | Status: DC
  Administered 2024-01-18 – 2024-02-02 (×14): 81 mg via ORAL
  Filled 2024-01-17 (×14): qty 1

## 2024-01-17 MED ORDER — ALUM & MAG HYDROXIDE-SIMETH 200-200-20 MG/5ML PO SUSP
15.0000 mL | ORAL | Status: DC | PRN
  Administered 2024-02-01: 30 mL via ORAL
  Filled 2024-01-17: qty 30

## 2024-01-17 MED ORDER — UMECLIDINIUM BROMIDE 62.5 MCG/ACT IN AEPB
1.0000 | INHALATION_SPRAY | Freq: Every day | RESPIRATORY_TRACT | Status: DC
  Administered 2024-01-18 – 2024-02-02 (×15): 1 via RESPIRATORY_TRACT
  Filled 2024-01-17 (×3): qty 7

## 2024-01-17 MED ORDER — CYCLOBENZAPRINE HCL 10 MG PO TABS
10.0000 mg | ORAL_TABLET | Freq: Every day | ORAL | Status: DC
  Administered 2024-01-17: 10 mg via ORAL
  Filled 2024-01-17: qty 1

## 2024-01-17 MED ORDER — ACETAMINOPHEN 325 MG RE SUPP
325.0000 mg | RECTAL | Status: DC | PRN
  Filled 2024-01-17: qty 2

## 2024-01-17 MED ORDER — HEPARIN SODIUM (PORCINE) 1000 UNIT/ML IJ SOLN
INTRAMUSCULAR | Status: AC
  Filled 2024-01-17: qty 10

## 2024-01-17 MED ORDER — DIPHENHYDRAMINE HCL 50 MG/ML IJ SOLN
50.0000 mg | Freq: Once | INTRAMUSCULAR | Status: AC | PRN
  Administered 2024-01-17: 50 mg via INTRAVENOUS

## 2024-01-17 MED ORDER — FAMOTIDINE 20 MG PO TABS: 20.0000 mg | ORAL_TABLET | Freq: Every day | ORAL | Status: DC | PRN

## 2024-01-17 MED ORDER — FENTANYL CITRATE PF 50 MCG/ML IJ SOSY
PREFILLED_SYRINGE | INTRAMUSCULAR | Status: AC
  Filled 2024-01-17: qty 1

## 2024-01-17 MED ORDER — ALPRAZOLAM 0.5 MG PO TABS
0.5000 mg | ORAL_TABLET | Freq: Two times a day (BID) | ORAL | Status: DC | PRN
  Administered 2024-01-17 – 2024-02-02 (×9): 0.5 mg via ORAL
  Filled 2024-01-17 (×9): qty 1

## 2024-01-17 MED ORDER — LISINOPRIL 20 MG PO TABS: 20.0000 mg | ORAL_TABLET | Freq: Every day | ORAL | Status: DC

## 2024-01-17 MED ORDER — LABETALOL HCL 5 MG/ML IV SOLN
INTRAVENOUS | Status: DC | PRN
Start: 2024-01-17 — End: 2024-01-17
  Administered 2024-01-17: 20 mg via INTRAVENOUS

## 2024-01-17 MED ORDER — PHENOL 1.4 % MT LIQD: 1.0000 | OROMUCOSAL | Status: DC | PRN

## 2024-01-17 MED ORDER — OXYCODONE-ACETAMINOPHEN 5-325 MG PO TABS
1.0000 | ORAL_TABLET | ORAL | Status: DC | PRN
  Administered 2024-01-19 – 2024-01-20 (×2): 1 via ORAL
  Administered 2024-01-23 – 2024-01-25 (×3): 2 via ORAL
  Administered 2024-01-26: 1 via ORAL
  Administered 2024-01-26 – 2024-02-02 (×13): 2 via ORAL
  Filled 2024-01-17: qty 1
  Filled 2024-01-17 (×12): qty 2
  Filled 2024-01-17: qty 1
  Filled 2024-01-17 (×3): qty 2
  Filled 2024-01-17: qty 1
  Filled 2024-01-17: qty 2

## 2024-01-17 MED ORDER — LIDOCAINE-EPINEPHRINE (PF) 1 %-1:200000 IJ SOLN
INTRAMUSCULAR | Status: DC | PRN
Start: 2024-01-17 — End: 2024-01-17
  Administered 2024-01-17: 10 mL

## 2024-01-17 MED ORDER — FENTANYL CITRATE (PF) 100 MCG/2ML IJ SOLN
INTRAMUSCULAR | Status: DC | PRN
  Administered 2024-01-17: 50 ug via INTRAVENOUS

## 2024-01-17 MED ORDER — METHYLPREDNISOLONE SODIUM SUCC 125 MG IJ SOLR: 125.0000 mg | Freq: Once | INTRAMUSCULAR | Status: DC | PRN

## 2024-01-17 MED ORDER — HYDRALAZINE HCL 20 MG/ML IJ SOLN: 5.0000 mg | INTRAMUSCULAR | Status: DC | PRN

## 2024-01-17 MED ORDER — METHYLPREDNISOLONE SODIUM SUCC 125 MG IJ SOLR
INTRAMUSCULAR | Status: AC
  Filled 2024-01-17: qty 2

## 2024-01-17 MED ORDER — HEPARIN (PORCINE) 25000 UT/250ML-% IV SOLN
750.0000 [IU]/h | INTRAVENOUS | Status: DC
  Administered 2024-01-17: 550 [IU]/h via INTRAVENOUS
  Administered 2024-01-19: 700 [IU]/h via INTRAVENOUS
  Administered 2024-01-20 – 2024-01-21 (×2): 800 [IU]/h via INTRAVENOUS
  Administered 2024-01-22: 750 [IU]/h via INTRAVENOUS
  Filled 2024-01-17 (×3): qty 250

## 2024-01-17 MED ORDER — VITAMIN D 25 MCG (1000 UNIT) PO TABS
1000.0000 [IU] | ORAL_TABLET | Freq: Every morning | ORAL | Status: DC
  Administered 2024-01-18 – 2024-02-02 (×14): 1000 [IU] via ORAL
  Filled 2024-01-17 (×14): qty 1

## 2024-01-17 MED ORDER — CEFAZOLIN SODIUM-DEXTROSE 2-4 GM/100ML-% IV SOLN
INTRAVENOUS | Status: AC
  Filled 2024-01-17: qty 100

## 2024-01-17 MED ORDER — FLUTICASONE FUROATE-VILANTEROL 100-25 MCG/ACT IN AEPB
1.0000 | INHALATION_SPRAY | Freq: Every day | RESPIRATORY_TRACT | Status: DC
  Administered 2024-01-18 – 2024-02-02 (×16): 1 via RESPIRATORY_TRACT
  Filled 2024-01-17 (×3): qty 28

## 2024-01-17 MED ORDER — ACETAMINOPHEN 325 MG PO TABS
325.0000 mg | ORAL_TABLET | ORAL | Status: DC | PRN
  Filled 2024-01-17: qty 2

## 2024-01-17 MED ORDER — DIPHENHYDRAMINE HCL 50 MG/ML IJ SOLN
INTRAMUSCULAR | Status: AC
  Filled 2024-01-17: qty 1

## 2024-01-17 MED ORDER — MIDAZOLAM HCL 2 MG/ML PO SYRP: 8.0000 mg | ORAL_SOLUTION | Freq: Once | ORAL | Status: DC | PRN

## 2024-01-17 SURGICAL SUPPLY — 13 items
CATH ANGIO 5F PIGTAIL 65CM (CATHETERS) IMPLANT
CATH BEACON 5 .038 100 VERT TP (CATHETERS) IMPLANT
COVER PROBE ULTRASOUND 5X96 (MISCELLANEOUS) IMPLANT
DEVICE PRESTO INFLATION (MISCELLANEOUS) IMPLANT
DEVICE STARCLOSE SE CLOSURE (Vascular Products) IMPLANT
GLIDEWIRE ADV .035X260CM (WIRE) IMPLANT
PACK ANGIOGRAPHY (CUSTOM PROCEDURE TRAY) ×1 IMPLANT
SHEATH ANL2 6FRX45 HC (SHEATH) IMPLANT
SHEATH BRITE TIP 5FRX11 (SHEATH) IMPLANT
SYR MEDRAD MARK 7 150ML (SYRINGE) IMPLANT
TUBING CONTRAST HIGH PRESS 72 (TUBING) IMPLANT
WIRE G V18X300CM (WIRE) IMPLANT
WIRE J 3MM .035X145CM (WIRE) IMPLANT

## 2024-01-17 NOTE — Progress Notes (Signed)
 Pt admitted r/t failed angiogram. Was unable to perform surgery outpatient, will monitor here until later this week to perform procedure. BP 126/77 (BP Location: Right Arm)   Pulse 80   Temp (!) 97.5 F (36.4 C)   Resp 18   Ht 4\' 11"  (1.499 m)   Wt 54 kg   SpO2 99%   BMI 24.04 kg/m  Pt on Tele, hx of pacemaker. NSR verified. No skin issues, bed in lowest position, purewick on, 2/4 rails up, wheels locked and call bell near by. 2L/[place]. Anders Katz 01/17/24  5:16 PM

## 2024-01-17 NOTE — Consult Note (Signed)
 PHARMACY - ANTICOAGULATION CONSULT NOTE  Pharmacy Consult for heparin   Indication: lower extremity ischemia   Allergies  Allergen Reactions   Iodinated Contrast Media Itching   Trelegy Ellipta  [Fluticasone -Umeclidin-Vilant]     Had breathing issues    Patient Measurements: Height: 4\' 11"  (149.9 cm) Weight: 54 kg (119 lb 0.8 oz) IBW/kg (Calculated) : 43.2 HEPARIN  DW (KG): 54  Vital Signs: Temp: 98.1 F (36.7 C) (06/02 1945) Temp Source: Oral (06/02 1945) BP: 111/66 (06/02 1945) Pulse Rate: 87 (06/02 1945)  Labs: Recent Labs    01/17/24 0836 01/17/24 1208 01/17/24 1805  HGB  --  10.6*  --   HCT  --  34.1*  --   PLT  --  239  --   LABPROT  --  14.0  --   INR  --  1.1  --   HEPARINUNFRC  --   --  0.33  CREATININE 0.73 0.76  --     Estimated Creatinine Clearance: 50.5 mL/min (by C-G formula based on SCr of 0.76 mg/dL).   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/2016   Cancer (HCC) 2012   Right Lung CA   COPD (chronic obstructive pulmonary disease) (HCC)    Depression    Diabetes mellitus without complication (HCC)    Patient takes Janumet   Essential hypertension 05/26/2016   Heart failure (HCC) 2022   Hydropneumothorax 05/03/2020   Hypercholesteremia    Oxygen  dependent    2L at nite    PAD (peripheral artery disease) (HCC) 06/22/2016   Peripheral vascular disease (HCC)    Personal history of radiation therapy    Shortness of breath dyspnea    with exertion    Sialolithiasis    Sleep apnea    Wears dentures    full upper and lower    Medications:  Medications Prior to Admission  Medication Sig Dispense Refill Last Dose/Taking   albuterol  (VENTOLIN  HFA) 108 (90 Base) MCG/ACT inhaler INHALE 2 PUFFS BY MOUTH EVERY 6 HOURS AS NEEDED FOR WHEEZING OR SHORTNESS OF BREATH 3 each 5 01/17/2024 Morning   ALPRAZolam   (XANAX ) 0.5 MG tablet Take 1 tablet (0.5 mg total) by mouth 2 (two) times daily as needed for anxiety. 60 tablet 2 01/17/2024 Morning   aspirin  EC 81 MG tablet Take 1 tablet (81 mg total) by mouth daily. Swallow whole. 150 tablet 2 01/17/2024 Morning   atorvastatin  (LIPITOR) 10 MG tablet Take 1 tablet (10 mg total) by mouth daily. 30 tablet 11 01/17/2024 Morning   calcium  carbonate (OSCAL) 1500 (600 Ca) MG TABS tablet Take 600 mg of elemental calcium  by mouth 2 (two) times daily with a meal.   01/17/2024 Morning   clopidogrel  (PLAVIX ) 75 MG tablet Take 1 tablet (75 mg total) by mouth daily. 90 tablet 3 01/17/2024 Morning   cyclobenzaprine  (FLEXERIL ) 10 MG tablet Take 1 tablet (10 mg total) by mouth at bedtime. Take one tab po qhs for back spasm prn only 30 tablet 3 Past Month   escitalopram  (LEXAPRO ) 10 MG tablet Take 1 tablet (10 mg total) by mouth daily. 90 tablet 1 01/17/2024 Morning   ferrous sulfate  325 (65 FE) MG tablet Take 325 mg by mouth 2 (two) times daily with a meal.   01/17/2024 Morning   furosemide  (LASIX ) 20 MG tablet Take 1 tablet (20 mg total) by mouth daily  as needed. 30 tablet 5 01/16/2024   gabapentin  (NEURONTIN ) 300 MG capsule TAKE 1 CAPSULE BY MOUTH THREE TIMES DAILY 90 capsule 0 01/17/2024 Morning   insulin  glargine, 1 Unit Dial , (TOUJEO ) 300 UNIT/ML Solostar Pen Inject 10 Units into the skin daily. 1.5 mL 11 01/17/2024 Morning   ipratropium-albuterol  (DUONEB) 0.5-2.5 (3) MG/3ML SOLN USE 1 AMPULE IN NEBULIZER EVERY 4 HOURS AS NEEDED 360 mL 0 01/17/2024 Morning   lisinopril -hydrochlorothiazide  (ZESTORETIC ) 20-25 MG tablet Take 1 tablet by mouth daily. 90 tablet 3 01/17/2024 Morning   metoprolol  succinate (TOPROL -XL) 25 MG 24 hr tablet Take 1 tablet (25 mg total) by mouth daily. 90 tablet 3 01/17/2024 Morning   mometasone -formoterol  (DULERA ) 200-5 MCG/ACT AERO Inhale 2 puffs by mouth twice daily 13 g 12 01/17/2024 Morning   potassium chloride  (KLOR-CON ) 10 MEQ tablet TAKE 1 TABLET BY MOUTH EVERY OTHER DAY 45  tablet 0 01/16/2024   SPIRIVA  HANDIHALER 18 MCG inhalation capsule INHALE THE CONTENTS OF 1 CAPSULES BY MOUTH ONCE DAILY - DO NOT SWALLOW CAPSULES 90 capsule 3 01/17/2024 Morning   vitamin B-12 (CYANOCOBALAMIN ) 100 MCG tablet Take 100 mcg by mouth daily.   01/17/2024 Morning   VITAMIN D , CHOLECALCIFEROL , PO Take 1 tablet by mouth in the morning.   01/17/2024 Morning   ACCU-CHEK GUIDE TEST test strip USE TO TEST BLOOD SUGAR 5 TIMES DAILY 450 each 0    Accu-Chek Softclix Lancets lancets Use 1 lancet 3 times daily to check glucose for diabetes 300 each 1    Blood Glucose Monitoring Suppl (ACCU-CHEK GUIDE) w/Device KIT Use as directed Dx e11.65 1 kit 0    Continuous Blood Gluc Receiver (DEXCOM G7 RECEIVER) DEVI Use one as directed for uncontrolled dm 1 each 0    Continuous Glucose Sensor (DEXCOM G7 SENSOR) MISC USE AS DIRECTED. CHANGE EVERY 10 DAYS. 3 each 0    Ensifentrine  (OHTUVAYRE ) 3 MG/2.5ML SUSP Inhale 3 mg into the lungs in the morning and at bedtime. (Patient not taking: Reported on 01/17/2024) 150 mL 11 Not Taking   famotidine  (PEPCID ) 20 MG tablet Take 1 tablet (20 mg total) by mouth daily for 14 days. (Patient taking differently: Take 20 mg by mouth daily as needed for heartburn.) 14 tablet 0    Insulin  Pen Needle (RELION PEN NEEDLES) 31G X 6 MM MISC USE 1 PEN NEEDLE WITH INSULIN  PEN ONCE DAILY FOR DIABETES 50 each 3    methylPREDNISolone  (MEDROL  DOSEPAK) 4 MG TBPK tablet       OXYGEN  Inhale 4 L into the lungs. PT USES ADAPT HEALTH FOR OXYGEN       Scheduled:   [START ON 01/18/2024] aspirin  EC  81 mg Oral Daily   [START ON 01/18/2024] atorvastatin   10 mg Oral Daily   calcium  carbonate  500 mg of elemental calcium  Oral BID WC   [START ON 01/18/2024] cholecalciferol   1,000 Units Oral q AM   cyclobenzaprine   10 mg Oral QHS   [START ON 01/18/2024] escitalopram   10 mg Oral Daily   ferrous sulfate   325 mg Oral BID WC   [START ON 01/18/2024] fluticasone  furoate-vilanterol  1 puff Inhalation Daily   gabapentin    300 mg Oral TID   [START ON 01/18/2024] lisinopril   20 mg Oral Daily   And   [START ON 01/18/2024] hydrochlorothiazide   25 mg Oral Daily   [START ON 01/18/2024] insulin  glargine  10 Units Subcutaneous Daily   [START ON 01/18/2024] metoprolol  succinate  25 mg Oral Daily   [START ON 01/18/2024] pantoprazole  40 mg Oral Daily   [START ON 01/18/2024] potassium chloride   10 mEq Oral QODAY   potassium chloride   20-40 mEq Oral Once   [START ON 01/18/2024] umeclidinium bromide   1 puff Inhalation Daily   [START ON 01/18/2024] vitamin B-12  100 mcg Oral Daily   Infusions:   heparin      PRN: acetaminophen  **OR** acetaminophen , ALPRAZolam , alum & mag hydroxide-simeth, diphenhydrAMINE , famotidine , guaiFENesin -dextromethorphan, hydrALAZINE , ipratropium-albuterol , labetalol , magnesium  hydroxide, metoprolol  tartrate, morphine  injection, ondansetron , oxyCODONE -acetaminophen , phenol, sorbitol Anti-infectives (From admission, onward)    Start     Dose/Rate Route Frequency Ordered Stop   01/17/24 0829  ceFAZolin  (ANCEF ) IVPB 2g/100 mL premix        2 g 200 mL/hr over 30 Minutes Intravenous 30 min pre-op 01/17/24 0454 01/17/24 2113       Assessment: 68 yr old female underwent LE angiography 6/2. 70% stenosis of the left common femoral artery with disease extending down into the proximal profunda femoris artery. No DOAC PTA. PMH: HTN, DM, IDA, PAD. No CBC will order a stat lab.   6/2 1805    HL 0.33, therapeutic x 1 6/2 2145    Hepatin gtt held for 1 hr d/t bleeding  Goal of Therapy:  Heparin  level 0.3-0.7 units/ml Monitor platelets by anticoagulation protocol: Yes   Plan:  6/2 @ 2145:  RN reports pt bleeding at infusion site,  Dr Prescilla Brod wants to hold heparin  drip for 1 hr and restart @ 550 units/hr. - will recheck HL 6 hrs after restart - CBC daily  Jaquetta Currier D 01/17/2024 9:48 PM

## 2024-01-17 NOTE — Plan of Care (Signed)
  Problem: Education: Goal: Knowledge of General Education information will improve Description: Including pain rating scale, medication(s)/side effects and non-pharmacologic comfort measures Outcome: Progressing   Problem: Health Behavior/Discharge Planning: Goal: Ability to manage health-related needs will improve Outcome: Progressing   Problem: Clinical Measurements: Goal: Ability to maintain clinical measurements within normal limits will improve Outcome: Progressing Goal: Will remain free from infection Outcome: Progressing Goal: Diagnostic test results will improve Outcome: Progressing Goal: Respiratory complications will improve Outcome: Progressing Goal: Cardiovascular complication will be avoided Outcome: Progressing   Problem: Coping: Goal: Level of anxiety will decrease Outcome: Progressing   Problem: Nutrition: Goal: Adequate nutrition will be maintained Outcome: Progressing   Problem: Elimination: Goal: Will not experience complications related to bowel motility Outcome: Progressing Goal: Will not experience complications related to urinary retention Outcome: Progressing   Problem: Pain Managment: Goal: General experience of comfort will improve and/or be controlled Outcome: Progressing

## 2024-01-17 NOTE — Consult Note (Signed)
 PHARMACY - ANTICOAGULATION CONSULT NOTE  Pharmacy Consult for heparin   Indication: lower extremity ischemia   Allergies  Allergen Reactions   Iodinated Contrast Media Itching   Trelegy Ellipta  [Fluticasone -Umeclidin-Vilant]     Had breathing issues    Patient Measurements: Height: 4\' 11"  (149.9 cm) Weight: 54 kg (119 lb 0.8 oz) IBW/kg (Calculated) : 43.2 HEPARIN  DW (KG): 54  Vital Signs: Temp: 97.5 F (36.4 C) (06/02 1515) Temp Source: Oral (06/02 1445) BP: 126/77 (06/02 1515) Pulse Rate: 80 (06/02 1515)  Labs: Recent Labs    01/17/24 0836 01/17/24 1208 01/17/24 1805  HGB  --  10.6*  --   HCT  --  34.1*  --   PLT  --  239  --   LABPROT  --  14.0  --   INR  --  1.1  --   HEPARINUNFRC  --   --  0.33  CREATININE 0.73 0.76  --     Estimated Creatinine Clearance: 50.5 mL/min (by C-G formula based on SCr of 0.76 mg/dL).   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/2016   Cancer (HCC) 2012   Right Lung CA   COPD (chronic obstructive pulmonary disease) (HCC)    Depression    Diabetes mellitus without complication (HCC)    Patient takes Janumet   Essential hypertension 05/26/2016   Heart failure (HCC) 2022   Hydropneumothorax 05/03/2020   Hypercholesteremia    Oxygen  dependent    2L at nite    PAD (peripheral artery disease) (HCC) 06/22/2016   Peripheral vascular disease (HCC)    Personal history of radiation therapy    Shortness of breath dyspnea    with exertion    Sialolithiasis    Sleep apnea    Wears dentures    full upper and lower    Medications:  Medications Prior to Admission  Medication Sig Dispense Refill Last Dose/Taking   albuterol  (VENTOLIN  HFA) 108 (90 Base) MCG/ACT inhaler INHALE 2 PUFFS BY MOUTH EVERY 6 HOURS AS NEEDED FOR WHEEZING OR SHORTNESS OF BREATH 3 each 5 01/17/2024 Morning   ALPRAZolam   (XANAX ) 0.5 MG tablet Take 1 tablet (0.5 mg total) by mouth 2 (two) times daily as needed for anxiety. 60 tablet 2 01/17/2024 Morning   aspirin  EC 81 MG tablet Take 1 tablet (81 mg total) by mouth daily. Swallow whole. 150 tablet 2 01/17/2024 Morning   atorvastatin  (LIPITOR) 10 MG tablet Take 1 tablet (10 mg total) by mouth daily. 30 tablet 11 01/17/2024 Morning   calcium  carbonate (OSCAL) 1500 (600 Ca) MG TABS tablet Take 600 mg of elemental calcium  by mouth 2 (two) times daily with a meal.   01/17/2024 Morning   clopidogrel  (PLAVIX ) 75 MG tablet Take 1 tablet (75 mg total) by mouth daily. 90 tablet 3 01/17/2024 Morning   cyclobenzaprine  (FLEXERIL ) 10 MG tablet Take 1 tablet (10 mg total) by mouth at bedtime. Take one tab po qhs for back spasm prn only 30 tablet 3 Past Month   escitalopram  (LEXAPRO ) 10 MG tablet Take 1 tablet (10 mg total) by mouth daily. 90 tablet 1 01/17/2024 Morning   ferrous sulfate  325 (65 FE) MG tablet Take 325 mg by mouth 2 (two) times daily with a meal.   01/17/2024 Morning   furosemide  (LASIX ) 20 MG tablet Take 1 tablet (20 mg total) by mouth daily  as needed. 30 tablet 5 01/16/2024   gabapentin  (NEURONTIN ) 300 MG capsule TAKE 1 CAPSULE BY MOUTH THREE TIMES DAILY 90 capsule 0 01/17/2024 Morning   insulin  glargine, 1 Unit Dial , (TOUJEO ) 300 UNIT/ML Solostar Pen Inject 10 Units into the skin daily. 1.5 mL 11 01/17/2024 Morning   ipratropium-albuterol  (DUONEB) 0.5-2.5 (3) MG/3ML SOLN USE 1 AMPULE IN NEBULIZER EVERY 4 HOURS AS NEEDED 360 mL 0 01/17/2024 Morning   lisinopril -hydrochlorothiazide  (ZESTORETIC ) 20-25 MG tablet Take 1 tablet by mouth daily. 90 tablet 3 01/17/2024 Morning   metoprolol  succinate (TOPROL -XL) 25 MG 24 hr tablet Take 1 tablet (25 mg total) by mouth daily. 90 tablet 3 01/17/2024 Morning   mometasone -formoterol  (DULERA ) 200-5 MCG/ACT AERO Inhale 2 puffs by mouth twice daily 13 g 12 01/17/2024 Morning   potassium chloride  (KLOR-CON ) 10 MEQ tablet TAKE 1 TABLET BY MOUTH EVERY OTHER DAY 45  tablet 0 01/16/2024   SPIRIVA  HANDIHALER 18 MCG inhalation capsule INHALE THE CONTENTS OF 1 CAPSULES BY MOUTH ONCE DAILY - DO NOT SWALLOW CAPSULES 90 capsule 3 01/17/2024 Morning   vitamin B-12 (CYANOCOBALAMIN ) 100 MCG tablet Take 100 mcg by mouth daily.   01/17/2024 Morning   VITAMIN D , CHOLECALCIFEROL , PO Take 1 tablet by mouth in the morning.   01/17/2024 Morning   ACCU-CHEK GUIDE TEST test strip USE TO TEST BLOOD SUGAR 5 TIMES DAILY 450 each 0    Accu-Chek Softclix Lancets lancets Use 1 lancet 3 times daily to check glucose for diabetes 300 each 1    Blood Glucose Monitoring Suppl (ACCU-CHEK GUIDE) w/Device KIT Use as directed Dx e11.65 1 kit 0    Continuous Blood Gluc Receiver (DEXCOM G7 RECEIVER) DEVI Use one as directed for uncontrolled dm 1 each 0    Continuous Glucose Sensor (DEXCOM G7 SENSOR) MISC USE AS DIRECTED. CHANGE EVERY 10 DAYS. 3 each 0    Ensifentrine  (OHTUVAYRE ) 3 MG/2.5ML SUSP Inhale 3 mg into the lungs in the morning and at bedtime. (Patient not taking: Reported on 01/17/2024) 150 mL 11 Not Taking   famotidine  (PEPCID ) 20 MG tablet Take 1 tablet (20 mg total) by mouth daily for 14 days. (Patient taking differently: Take 20 mg by mouth daily as needed for heartburn.) 14 tablet 0    Insulin  Pen Needle (RELION PEN NEEDLES) 31G X 6 MM MISC USE 1 PEN NEEDLE WITH INSULIN  PEN ONCE DAILY FOR DIABETES 50 each 3    methylPREDNISolone  (MEDROL  DOSEPAK) 4 MG TBPK tablet       OXYGEN  Inhale 4 L into the lungs. PT USES [facility] FOR OXYGEN       Scheduled:   [START ON 01/18/2024] aspirin  EC  81 mg Oral Daily   [START ON 01/18/2024] atorvastatin   10 mg Oral Daily   calcium  carbonate  500 mg of elemental calcium  Oral BID WC   [START ON 01/18/2024] cholecalciferol   1,000 Units Oral q AM   cyclobenzaprine   10 mg Oral QHS   [START ON 01/18/2024] escitalopram   10 mg Oral Daily   ferrous sulfate   325 mg Oral BID WC   [START ON 01/18/2024] fluticasone  furoate-vilanterol  1 puff Inhalation Daily   gabapentin    300 mg Oral TID   [START ON 01/18/2024] lisinopril   20 mg Oral Daily   And   [START ON 01/18/2024] hydrochlorothiazide   25 mg Oral Daily   [START ON 01/18/2024] insulin  glargine  10 Units Subcutaneous Daily   [START ON 01/18/2024] metoprolol  succinate  25 mg Oral Daily   [START ON 01/18/2024] pantoprazole  40 mg Oral Daily   [START ON 01/18/2024] potassium chloride   10 mEq Oral QODAY   potassium chloride   20-40 mEq Oral Once   [START ON 01/18/2024] umeclidinium bromide   1 puff Inhalation Daily   [START ON 01/18/2024] vitamin B-12  100 mcg Oral Daily   Infusions:   heparin  650 Units/hr (01/17/24 1232)   PRN: acetaminophen  **OR** acetaminophen , ALPRAZolam , alum & mag hydroxide-simeth, diphenhydrAMINE , famotidine , guaiFENesin -dextromethorphan, hydrALAZINE , ipratropium-albuterol , labetalol , magnesium  hydroxide, metoprolol  tartrate, morphine  injection, ondansetron , oxyCODONE -acetaminophen , phenol, sorbitol Anti-infectives (From admission, onward)    Start     Dose/Rate Route Frequency Ordered Stop   01/17/24 0829  ceFAZolin  (ANCEF ) IVPB 2g/100 mL premix        2 g 200 mL/hr over 30 Minutes Intravenous 30 min pre-op 01/17/24 0829 01/17/24 1008       Assessment: 68 yr old female underwent LE angiography 6/2. 70% stenosis of the left common femoral artery with disease extending down into the proximal profunda femoris artery. No DOAC PTA. PMH: HTN, DM, IDA, PAD. No CBC will order a stat lab.   6/2 1805    HL 0.33, therapeutic x 1 Goal of Therapy:  Heparin  level 0.3-0.7 units/ml Monitor platelets by anticoagulation protocol: Yes   Plan:  HL 0.33 is therapeutic x 1. Continue with current rate of 650 units/hr. Recheck HL in 6 hours for confirmation. Daily CBC while on Heparin  infusion.  Dania Dupre, PharmD, BCPS 01/17/2024 6:38 PM

## 2024-01-17 NOTE — Consult Note (Addendum)
 PHARMACY - ANTICOAGULATION CONSULT NOTE  Pharmacy Consult for heparin   Indication: lower extremity ischemia   Allergies  Allergen Reactions   Iodinated Contrast Media Itching   Trelegy Ellipta  [Fluticasone -Umeclidin-Vilant]     Had breathing issues    Patient Measurements: Height: 4\' 11"  (149.9 cm) Weight: 54 kg (119 lb 0.8 oz) IBW/kg (Calculated) : 43.2 HEPARIN  DW (KG): 54  Vital Signs: Temp: 98.7 F (37.1 C) (06/02 0842) Temp Source: Oral (06/02 0842) BP: 134/109 (06/02 1130) Pulse Rate: 84 (06/02 1130)  Labs: Recent Labs    01/17/24 0836  CREATININE 0.73    Estimated Creatinine Clearance: 50.5 mL/min (by C-G formula based on SCr of 0.73 mg/dL).   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/2016   Cancer (HCC) 2012   Right Lung CA   COPD (chronic obstructive pulmonary disease) (HCC)    Depression    Diabetes mellitus without complication (HCC)    Patient takes Janumet   Essential hypertension 05/26/2016   Heart failure (HCC) 2022   Hydropneumothorax 05/03/2020   Hypercholesteremia    Oxygen  dependent    2L at nite    PAD (peripheral artery disease) (HCC) 06/22/2016   Peripheral vascular disease (HCC)    Personal history of radiation therapy    Shortness of breath dyspnea    with exertion    Sialolithiasis    Sleep apnea    Wears dentures    full upper and lower    Medications:  Medications Prior to Admission  Medication Sig Dispense Refill Last Dose/Taking   albuterol  (VENTOLIN  HFA) 108 (90 Base) MCG/ACT inhaler INHALE 2 PUFFS BY MOUTH EVERY 6 HOURS AS NEEDED FOR WHEEZING OR SHORTNESS OF BREATH 3 each 5 01/17/2024 Morning   ALPRAZolam  (XANAX ) 0.5 MG tablet Take 1 tablet (0.5 mg total) by mouth 2 (two) times daily as needed for anxiety. 60 tablet 2 01/17/2024 Morning   aspirin  EC 81 MG tablet Take 1 tablet (81 mg  total) by mouth daily. Swallow whole. 150 tablet 2 01/17/2024 Morning   atorvastatin  (LIPITOR) 10 MG tablet Take 1 tablet (10 mg total) by mouth daily. 30 tablet 11 01/17/2024 Morning   calcium  carbonate (OSCAL) 1500 (600 Ca) MG TABS tablet Take 600 mg of elemental calcium  by mouth 2 (two) times daily with a meal.   01/17/2024 Morning   clopidogrel  (PLAVIX ) 75 MG tablet Take 1 tablet (75 mg total) by mouth daily. 90 tablet 3 01/17/2024 Morning   cyclobenzaprine  (FLEXERIL ) 10 MG tablet Take 1 tablet (10 mg total) by mouth at bedtime. Take one tab po qhs for back spasm prn only 30 tablet 3 Past Month   escitalopram  (LEXAPRO ) 10 MG tablet Take 1 tablet (10 mg total) by mouth daily. 90 tablet 1 01/17/2024 Morning   ferrous sulfate  325 (65 FE) MG tablet Take 325 mg by mouth 2 (two) times daily with a meal.   01/17/2024 Morning   furosemide  (LASIX ) 20 MG tablet Take 1 tablet (20 mg total) by mouth daily as needed. 30 tablet 5 01/16/2024   gabapentin  (NEURONTIN ) 300 MG capsule TAKE 1 CAPSULE BY MOUTH THREE TIMES DAILY 90 capsule 0 01/17/2024 Morning   insulin  glargine, 1 Unit Dial , (TOUJEO ) 300 UNIT/ML Solostar Pen Inject 10 Units into the skin daily. 1.5 mL 11 01/17/2024 Morning   ipratropium-albuterol  (DUONEB) 0.5-2.5 (3) MG/3ML SOLN USE 1 AMPULE IN  NEBULIZER EVERY 4 HOURS AS NEEDED 360 mL 0 01/17/2024 Morning   lisinopril -hydrochlorothiazide  (ZESTORETIC ) 20-25 MG tablet Take 1 tablet by mouth daily. 90 tablet 3 01/17/2024 Morning   metoprolol  succinate (TOPROL -XL) 25 MG 24 hr tablet Take 1 tablet (25 mg total) by mouth daily. 90 tablet 3 01/17/2024 Morning   mometasone -formoterol  (DULERA ) 200-5 MCG/ACT AERO Inhale 2 puffs by mouth twice daily 13 g 12 01/17/2024 Morning   potassium chloride  (KLOR-CON ) 10 MEQ tablet TAKE 1 TABLET BY MOUTH EVERY OTHER DAY 45 tablet 0 01/16/2024   SPIRIVA  HANDIHALER 18 MCG inhalation capsule INHALE THE CONTENTS OF 1 CAPSULES BY MOUTH ONCE DAILY - DO NOT SWALLOW CAPSULES 90 capsule 3 01/17/2024 Morning    vitamin B-12 (CYANOCOBALAMIN ) 100 MCG tablet Take 100 mcg by mouth daily.   01/17/2024 Morning   VITAMIN D , CHOLECALCIFEROL , PO Take 1 tablet by mouth in the morning.   01/17/2024 Morning   ACCU-CHEK GUIDE TEST test strip USE TO TEST BLOOD SUGAR 5 TIMES DAILY 450 each 0    Accu-Chek Softclix Lancets lancets Use 1 lancet 3 times daily to check glucose for diabetes 300 each 1    Blood Glucose Monitoring Suppl (ACCU-CHEK GUIDE) w/Device KIT Use as directed Dx e11.65 1 kit 0    Continuous Blood Gluc Receiver (DEXCOM G7 RECEIVER) DEVI Use one as directed for uncontrolled dm 1 each 0    Continuous Glucose Sensor (DEXCOM G7 SENSOR) MISC USE AS DIRECTED. CHANGE EVERY 10 DAYS. 3 each 0    Ensifentrine  (OHTUVAYRE ) 3 MG/2.5ML SUSP Inhale 3 mg into the lungs in the morning and at bedtime. (Patient not taking: Reported on 01/17/2024) 150 mL 11 Not Taking   famotidine  (PEPCID ) 20 MG tablet Take 1 tablet (20 mg total) by mouth daily for 14 days. (Patient taking differently: Take 20 mg by mouth daily as needed for heartburn.) 14 tablet 0    Insulin  Pen Needle (RELION PEN NEEDLES) 31G X 6 MM MISC USE 1 PEN NEEDLE WITH INSULIN  PEN ONCE DAILY FOR DIABETES 50 each 3    methylPREDNISolone  (MEDROL  DOSEPAK) 4 MG TBPK tablet       OXYGEN  Inhale 4 L into the lungs. PT USES [facility] FOR OXYGEN       Scheduled:   [START ON 01/18/2024] aspirin  EC  81 mg Oral Daily   [START ON 01/18/2024] atorvastatin   10 mg Oral Daily   calcium  carbonate  600 mg of elemental calcium  Oral BID WC   cyclobenzaprine   10 mg Oral QHS   [START ON 01/18/2024] escitalopram   10 mg Oral Daily   ferrous sulfate   325 mg Oral BID WC   [START ON 01/18/2024] fluticasone  furoate-vilanterol  1 puff Inhalation Daily   gabapentin   300 mg Oral TID   [START ON 01/18/2024] insulin  glargine (1 Unit Dial )  10 Units Subcutaneous Daily   [START ON 01/18/2024] lisinopril -hydrochlorothiazide   1 tablet Oral Daily   [START ON 01/18/2024] metoprolol  succinate  25 mg Oral Daily    pantoprazole  40 mg Oral Daily   [START ON 01/18/2024] potassium chloride   10 mEq Oral QODAY   potassium chloride   20-40 mEq Oral Once   [START ON 01/18/2024] tiotropium  18 mcg Inhalation Daily   [START ON 01/18/2024] vitamin B-12  100 mcg Oral Daily   [START ON 01/18/2024] Vitamin D  (Cholecalciferol )   Oral q AM   Infusions:  PRN: acetaminophen  **OR** acetaminophen , ALPRAZolam , alum & mag hydroxide-simeth, famotidine , guaiFENesin -dextromethorphan, hydrALAZINE , ipratropium-albuterol , labetalol , magnesium  hydroxide, metoprolol  tartrate, morphine  injection, ondansetron , oxyCODONE -acetaminophen ,  phenol, sorbitol Anti-infectives (From admission, onward)    Start     Dose/Rate Route Frequency Ordered Stop   01/17/24 0829  ceFAZolin  (ANCEF ) IVPB 2g/100 mL premix        2 g 200 mL/hr over 30 Minutes Intravenous 30 min pre-op 01/17/24 3244 01/17/24 1008       Assessment: 68 yr old female underwent LE angiography 6/2. 70% stenosis of the left common femoral artery with disease extending down into the proximal profunda femoris artery. No DOAC PTA. PMH: HTN, DM, IDA, PAD. No CBC will order a stat lab.   Goal of Therapy:  Heparin  level 0.3-0.7 units/ml Monitor platelets by anticoagulation protocol: Yes   Plan:  No bolus, as pt received heparin  in cath lab.  Start heparin  infusion at 650 units/hr Check anti-Xa level in 6 hours and daily while on heparin  Continue to monitor H&H and platelets  Trinidad Funk, PharmD, BCPS 01/17/2024,11:32 AM

## 2024-01-17 NOTE — Op Note (Signed)
 [facility]  Percutaneous Study/Intervention Procedural Note   Date of Surgery: 01/17/2024  Surgeon(s):Gleason Ardoin    Assistants:none  Pre-operative Diagnosis: PAD with rest pain LLE  Post-operative diagnosis:  Same  Procedure(s) Performed:             1.  Ultrasound guidance for vascular access right femoral artery             2.  Catheter placement into left common femoral artery from right femoral approach             3.  Aortogram and selective left lower extremity angiogram             4.  StarClose closure device right femoral artery  EBL: 5 cc  Contrast: 45 cc  Fluoro Time: 6.7 minutes  Moderate Conscious Sedation Time: approximately 39 minutes using 1 mg of Versed  and 25 mcg of Fentanyl               Indications:  Patient is a 68 y.o.female with a long history of PAD and now with recurrent rest pain of the left leg. The patient has noninvasive study showing a reduced ABI and suspected thrombosis of her previous left SFA stents. The patient is brought in for angiography for further evaluation and potential treatment.  Due to the limb threatening nature of the situation, angiogram was performed for attempted limb salvage. The patient is aware that if the procedure fails, amputation would be expected.  The patient also understands that even with successful revascularization, amputation may still be required due to the severity of the situation.  Risks and benefits are discussed and informed consent is obtained.   Procedure:  The patient was identified and appropriate procedural time out was performed.  The patient was then placed supine on the table and prepped and draped in the usual sterile fashion. Moderate conscious sedation was administered during a face to face encounter with the patient throughout the procedure with my supervision of the RN administering medicines and monitoring the patient's vital signs, pulse oximetry, telemetry and mental status  throughout from the start of the procedure until the patient was taken to the recovery room. Ultrasound was used to evaluate the right common femoral artery.  It was patent but somewhat diseased.  A digital ultrasound image was acquired.  A Seldinger needle was used to access the  common femoral artery under direct ultrasound guidance and a permanent image was performed.  A 0.035 J wire was advanced without resistance and a 5Fr sheath was placed.  Pigtail catheter was placed into the aorta and an AP aortogram was performed. This demonstrated normal renal arteries and small aorta and iliac segments.  There was some mild enlargement of the mid infrarenal aorta but this was not frankly aneurysmal.  The right iliac system did not have any obvious stenosis.  The left common iliac artery and proximal external iliac artery appeared patent, but the distal left external iliac artery was diseased creating greater than 60% stenosis with disease that extended down into the common femoral artery and femoral bifurcation. I then crossed the aortic bifurcation and advanced to the left femoral head. Selective left lower extremity angiogram was then performed. This demonstrated greater than 70% stenosis of the left common femoral artery with disease extending down into the proximal profunda femoris artery.  The SFA had a flush occlusion with the previously placed stents in place.  There was reconstitution of the popliteal artery below the stents.  Although  flow distally was fairly sluggish, there appeared to be no obvious stenosis in the tibial vessels although they were quite small. It was felt that the patient would need extensive hybrid procedure including femoral endarterectomy, likely left external iliac stent, endarterectomy of the profunda femoris artery, and revascularization of the SFA and popliteal arteries as well.  I will plan to admit her and put her on heparin  for now and do this later this week if we can get her  medically optimized.  I elected to terminate the procedure. The sheath was removed and StarClose closure device was deployed in the right femoral artery with excellent hemostatic result. The patient was taken to the recovery room in stable condition having tolerated the procedure well.  Findings:               Aortogram:  This demonstrated normal renal arteries and small aorta and iliac segments.  There was some mild enlargement of the mid infrarenal aorta but this was not frankly aneurysmal.  The right iliac system did not have any obvious stenosis.  The left common iliac artery and proximal external iliac artery appeared patent, but the distal left external iliac artery was diseased creating greater than 60% stenosis with disease that extended down into the common femoral artery and femoral bifurcation.             Left lower Extremity:  This demonstrated greater than 70% stenosis of the left common femoral artery with disease extending down into the proximal profunda femoris artery.  The SFA had a flush occlusion with the previously placed stents in place.  There was reconstitution of the popliteal artery below the stents.  Although flow distally was fairly sluggish, there appeared to be no obvious stenosis in the tibial vessels although they were quite small   Disposition: Patient was taken to the recovery room in stable condition having tolerated the procedure well.  Complications: None  Mikki Alexander 01/17/2024 11:14 AM   This note was created with Dragon Medical transcription system. Any errors in dictation are purely unintentional.

## 2024-01-17 NOTE — Interval H&P Note (Signed)
 History and Physical Interval Note:  01/17/2024 8:27 AM  Angela Jensen  has presented today for surgery, with the diagnosis of LLE Angio    ASO w rest pain.  The various methods of treatment have been discussed with the patient and family. After consideration of risks, benefits and other options for treatment, the patient has consented to  Procedure(s): Lower Extremity Angiography (Left) as a surgical intervention.  The patient's history has been reviewed, patient examined, no change in status, stable for surgery.  I have reviewed the patient's chart and labs.  Questions were answered to the patient's satisfaction.     Lynann Demetrius

## 2024-01-17 NOTE — OR Nursing (Signed)
Md notified of elevated BP

## 2024-01-18 ENCOUNTER — Encounter: Payer: Self-pay | Admitting: Vascular Surgery

## 2024-01-18 ENCOUNTER — Encounter: Payer: Self-pay | Admitting: Oncology

## 2024-01-18 DIAGNOSIS — I70229 Atherosclerosis of native arteries of extremities with rest pain, unspecified extremity: Secondary | ICD-10-CM

## 2024-01-18 DIAGNOSIS — I998 Other disorder of circulatory system: Secondary | ICD-10-CM | POA: Diagnosis not present

## 2024-01-18 LAB — CBC
HCT: 34.9 % — ABNORMAL LOW (ref 36.0–46.0)
Hemoglobin: 10.6 g/dL — ABNORMAL LOW (ref 12.0–15.0)
MCH: 28 pg (ref 26.0–34.0)
MCHC: 30.4 g/dL (ref 30.0–36.0)
MCV: 92.1 fL (ref 80.0–100.0)
Platelets: 245 10*3/uL (ref 150–400)
RBC: 3.79 MIL/uL — ABNORMAL LOW (ref 3.87–5.11)
RDW: 14 % (ref 11.5–15.5)
WBC: 10.9 10*3/uL — ABNORMAL HIGH (ref 4.0–10.5)
nRBC: 0 % (ref 0.0–0.2)

## 2024-01-18 LAB — TYPE AND SCREEN
ABO/RH(D): O POS
Antibody Screen: NEGATIVE

## 2024-01-18 LAB — GLUCOSE, CAPILLARY
Glucose-Capillary: 124 mg/dL — ABNORMAL HIGH (ref 70–99)
Glucose-Capillary: 169 mg/dL — ABNORMAL HIGH (ref 70–99)
Glucose-Capillary: 94 mg/dL (ref 70–99)
Glucose-Capillary: 153 mg/dL — ABNORMAL HIGH (ref 70–99)

## 2024-01-18 LAB — HEPARIN LEVEL (UNFRACTIONATED)
Heparin Unfractionated: 0.14 [IU]/mL — ABNORMAL LOW (ref 0.30–0.70)
Heparin Unfractionated: 0.15 [IU]/mL — ABNORMAL LOW (ref 0.30–0.70)
Heparin Unfractionated: 0.23 [IU]/mL — ABNORMAL LOW (ref 0.30–0.70)

## 2024-01-18 MED ORDER — INSULIN ASPART 100 UNIT/ML IJ SOLN
0.0000 [IU] | Freq: Three times a day (TID) | INTRAMUSCULAR | Status: DC
  Administered 2024-01-18: 1 [IU] via SUBCUTANEOUS
  Administered 2024-01-19: 2 [IU] via SUBCUTANEOUS
  Administered 2024-01-20 (×2): 1 [IU] via SUBCUTANEOUS
  Administered 2024-01-21: 2 [IU] via SUBCUTANEOUS
  Administered 2024-01-22 – 2024-01-24 (×5): 1 [IU] via SUBCUTANEOUS
  Administered 2024-01-24 – 2024-01-25 (×2): 2 [IU] via SUBCUTANEOUS
  Administered 2024-01-25 – 2024-01-26 (×5): 1 [IU] via SUBCUTANEOUS
  Administered 2024-01-27: 2 [IU] via SUBCUTANEOUS
  Administered 2024-01-27: 1 [IU] via SUBCUTANEOUS
  Filled 2024-01-18 (×19): qty 1

## 2024-01-18 MED ORDER — INSULIN ASPART 100 UNIT/ML IJ SOLN
0.0000 [IU] | Freq: Every day | INTRAMUSCULAR | Status: DC
  Administered 2024-01-23: 3 [IU] via SUBCUTANEOUS
  Administered 2024-01-27: 2 [IU] via SUBCUTANEOUS
  Filled 2024-01-18 (×2): qty 1

## 2024-01-18 MED ORDER — CYCLOBENZAPRINE HCL 10 MG PO TABS
10.0000 mg | ORAL_TABLET | Freq: Every evening | ORAL | Status: DC | PRN
  Administered 2024-01-30: 10 mg via ORAL
  Filled 2024-01-18: qty 1

## 2024-01-18 MED ORDER — LACTATED RINGERS IV BOLUS
500.0000 mL | Freq: Once | INTRAVENOUS | Status: AC
  Administered 2024-01-18: 500 mL via INTRAVENOUS

## 2024-01-18 NOTE — TOC CM/SW Note (Signed)
 Transition of Care [facility]) - Inpatient Brief Assessment   Patient Details  Name: Angela Jensen MRN: [id number] Date of Birth: 1956/02/04  Transition of Care [facility]) CM/SW Contact:    Loman Risk, RN Phone Number: 01/18/2024, 11:20 AM   Clinical Narrative:   Transition of Care [facility]) Screening Note   Patient Details  Name: Angela Jensen Date of Birth: 03-Sep-1955   Transition of Care [facility]) CM/SW Contact:    Loman Risk, RN Phone Number: 01/18/2024, 11:26 AM    Transition of Care Department [facility]) has reviewed patient and no TOC needs have been identified at this time. If new patient transition needs arise, please place a TOC consult.    Transition of Care Asessment: Insurance and Status: Insurance coverage has been reviewed Patient has primary care physician: Yes     Prior/Current Home Services: No current home services Social Drivers of Health Review: SDOH reviewed no interventions necessary Readmission risk has been reviewed: Yes Transition of care needs: no transition of care needs at this time

## 2024-01-18 NOTE — Inpatient Diabetes Management (Signed)
 Inpatient Diabetes Program Recommendations  AACE/ADA: New Consensus Statement on Inpatient Glycemic Control  Target Ranges:  Prepandial:   less than 140 mg/dL      Peak postprandial:   less than 180 mg/dL (1-2 hours)      Critically ill patients:  140 - 180 mg/dL    Latest Reference Range & Units 01/17/24 08:45 01/18/24 08:19  Glucose-Capillary 70 - 99 mg/dL 409 (H) 811 (H)   Review of Glycemic Control  Diabetes history: DM2 Outpatient Diabetes medications: Toujeo  10 units daily, Metformin  XR 1000 mg QAM Current orders for Inpatient glycemic control: Semglee  10 units daily  Inpatient Diabetes Program Recommendations:    Insulin : Please consider ordering CBGs AC&HS and Novolog  0-6 units TID with meals and Novolog  0-5 units at bedtime.  Thanks, Beacher Limerick, RN, MSN, CDCES Diabetes Coordinator Inpatient Diabetes Program [phone] (Team Pager from 8am to 5pm)

## 2024-01-18 NOTE — Consult Note (Addendum)
 [facility] CLINIC CARDIOLOGY CONSULT NOTE       [name] MRN: [id number] DOB/AGE: 1955/12/20 68 y.o.  Admit date: 01/17/2024 Referring Physician Dr. Mikki Alexander Primary Physician Laurence Pons, NP Primary Cardiologist Dr. Beau Bound Reason for Consultation POC  HPI: Angela Jensen is a 68 y.o. female  with a past medical history of chronic HFpEF, SSS s/p micra (06/2021), hypertension, hyperlipidemia, aortic atherosclerosis, IDDM, PVD, COPD/asthma (baseline 2L O2), OSA, tobacco use who presented for outpatient procedure on 01/17/2024 underwent lower extremity angiogram on 06/02 and found that patient will need a left femoral endarterectomy with illiac stents and SFA intervention. Cardiology was consulted for preoperative cardiac evaluation.  Patient with PAD with rest pain LLE underwent left lower extremity angiogram that demonstrated 70% stenosis of left common femoral artery. Work up today notable for Nat 138, K 3.9, Cr 0.76, Hgb 10.6, plts 239. LFTs within normal limits. EKG on 01/18/24 with sinus rhythm with RBBB, rate 91 bpm, this is unchanged from prior EKG.   At the time of my evaluation this AM, patient was resting comfortably in hospital bed. Patient denies any chest pain or palpitations. Patient endorses SOB, however states it's at baseline and has not had any worsening SOB. BP is soft this AM with stable HR.     Review of systems complete and found to be negative unless listed above    Past Medical History:  Diagnosis Date   Anemia    Arthritis    Asthma    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/2016   Cancer (HCC) 2012   Right Lung CA   COPD (chronic obstructive pulmonary disease) (HCC)    Depression    Diabetes mellitus without complication (HCC)    Patient takes Janumet   Essential hypertension 05/26/2016   Heart failure (HCC) 2022   Hydropneumothorax 05/03/2020   Hypercholesteremia    Oxygen  dependent     2L at nite    PAD (peripheral artery disease) (HCC) 06/22/2016   Peripheral vascular disease (HCC)    Personal history of radiation therapy    Shortness of breath dyspnea    with exertion    Sialolithiasis    Sleep apnea    Wears dentures    full upper and lower    Past Surgical History:  Procedure Laterality Date   CESAREAN SECTION     x3   COLONOSCOPY WITH PROPOFOL  N/A 06/25/2015   Procedure: COLONOSCOPY WITH PROPOFOL ;  Surgeon: Marnee Sink, MD;  Location: [facility];  Service: Endoscopy;  Laterality: N/A;   COLONOSCOPY WITH PROPOFOL  N/A 07/26/2020   Procedure: COLONOSCOPY WITH PROPOFOL ;  Surgeon: Marnee Sink, MD;  Location: [facility];  Service: Endoscopy;  Laterality: N/A;   CYST REMOVAL LEG     and on shoulder    ESOPHAGOGASTRODUODENOSCOPY (EGD) WITH PROPOFOL  N/A 07/26/2020   Procedure: ESOPHAGOGASTRODUODENOSCOPY (EGD) WITH PROPOFOL ;  Surgeon: Marnee Sink, MD;  Location: [facility];  Service: Endoscopy;  Laterality: N/A;  Diabetic - oral meds   LOWER EXTREMITY ANGIOGRAPHY Left 09/29/2018   Procedure: LOWER EXTREMITY ANGIOGRAPHY;  Surgeon: Celso College, MD;  Location: [facility];  Service: Cardiovascular;  Laterality: Left;   LOWER EXTREMITY ANGIOGRAPHY Left 07/29/2023   Procedure: Lower Extremity Angiography;  Surgeon: Celso College, MD;  Location: [facility];  Service: Cardiovascular;  Laterality: Left;   LOWER EXTREMITY ANGIOGRAPHY Right 12/20/2023   Procedure: Lower Extremity Angiography;  Surgeon: Celso College, MD;  Location: [facility];  Service: Cardiovascular;  Laterality: Right;   LOWER EXTREMITY ANGIOGRAPHY Left 01/17/2024   Procedure: Lower Extremity Angiography;  Surgeon: Celso College, MD;  Location: [facility];  Service: Cardiovascular;  Laterality: Left;   LUNG BIOPSY  12/30/2011   has lung "spots"   PACEMAKER IMPLANT  07/14/2021   PACEMAKER LEADLESS INSERTION N/A 07/14/2021   Procedure: PACEMAKER  LEADLESS INSERTION;  Surgeon: Percival Brace, MD;  Location: [facility];  Service: Cardiovascular;  Laterality: N/A;   PERIPHERAL VASCULAR CATHETERIZATION Left 06/01/2016   Procedure: Lower Extremity Angiography;  Surgeon: Celso College, MD;  Location: [facility];  Service: Cardiovascular;  Laterality: Left;   PERIPHERAL VASCULAR CATHETERIZATION N/A 06/01/2016   Procedure: Abdominal Aortogram w/Lower Extremity;  Surgeon: Celso College, MD;  Location: [facility];  Service: Cardiovascular;  Laterality: N/A;   PERIPHERAL VASCULAR CATHETERIZATION  06/01/2016   Procedure: Lower Extremity Intervention;  Surgeon: Celso College, MD;  Location: [facility];  Service: Cardiovascular;;   PERIPHERAL VASCULAR CATHETERIZATION Right 06/08/2016   Procedure: Lower Extremity Angiography;  Surgeon: Celso College, MD;  Location: [facility];  Service: Cardiovascular;  Laterality: Right;   PERIPHERAL VASCULAR CATHETERIZATION  06/08/2016   Procedure: Lower Extremity Intervention;  Surgeon: Celso College, MD;  Location: [facility];  Service: Cardiovascular;;   SUBMANDIBULAR GLAND EXCISION Left 12/06/2020   Procedure: EXCISION SUBMANDIBULAR GLAND;  Surgeon: Lesly Raspberry, MD;  Location: [facility];  Service: ENT;  Laterality: Left;  needs to be first case Diabetic - diet controlled   TEMPORARY PACEMAKER N/A 07/11/2021   Procedure: TEMPORARY PACEMAKER;  Surgeon: Percival Brace, MD;  Location: [facility];  Service: Cardiovascular;  Laterality: N/A;    Medications Prior to Admission  Medication Sig Dispense Refill Last Dose/Taking   albuterol  (VENTOLIN  HFA) 108 (90 Base) MCG/ACT inhaler INHALE 2 PUFFS BY MOUTH EVERY 6 HOURS AS NEEDED FOR WHEEZING OR SHORTNESS OF BREATH 3 each 5 01/17/2024 Morning   ALPRAZolam  (XANAX ) 0.5 MG tablet Take 1 tablet (0.5 mg total) by mouth 2 (two) times daily as needed for anxiety. 60 tablet 2 01/17/2024 Morning    aspirin  EC 81 MG tablet Take 1 tablet (81 mg total) by mouth daily. Swallow whole. 150 tablet 2 01/17/2024 Morning   atorvastatin  (LIPITOR) 10 MG tablet Take 1 tablet (10 mg total) by mouth daily. 30 tablet 11 01/17/2024 Morning   calcium  carbonate (OSCAL) 1500 (600 Ca) MG TABS tablet Take 600 mg of elemental calcium  by mouth 2 (two) times daily with a meal.   01/17/2024 Morning   clopidogrel  (PLAVIX ) 75 MG tablet Take 1 tablet (75 mg total) by mouth daily. 90 tablet 3 01/17/2024 Morning   cyclobenzaprine  (FLEXERIL ) 10 MG tablet Take 1 tablet (10 mg total) by mouth at bedtime. Take one tab po qhs for back spasm prn only 30 tablet 3 Past Month   escitalopram  (LEXAPRO ) 10 MG tablet Take 1 tablet (10 mg total) by mouth daily. 90 tablet 1 01/17/2024 Morning   ferrous sulfate  325 (65 FE) MG tablet Take 325 mg by mouth 2 (two) times daily with a meal.   01/17/2024 Morning   furosemide  (LASIX ) 20 MG tablet Take 1 tablet (20 mg total) by mouth daily as needed. 30 tablet 5 01/16/2024   gabapentin  (NEURONTIN ) 300 MG capsule TAKE 1 CAPSULE BY MOUTH THREE TIMES DAILY 90 capsule 0 01/17/2024 Morning   insulin  glargine,  1 Unit Dial , (TOUJEO ) 300 UNIT/ML Solostar Pen Inject 10 Units into the skin daily. 1.5 mL 11 01/17/2024 Morning   ipratropium-albuterol  (DUONEB) 0.5-2.5 (3) MG/3ML SOLN USE 1 AMPULE IN NEBULIZER EVERY 4 HOURS AS NEEDED 360 mL 0 01/17/2024 Morning   lisinopril -hydrochlorothiazide  (ZESTORETIC ) 20-25 MG tablet Take 1 tablet by mouth daily. 90 tablet 3 01/17/2024 Morning   metoprolol  succinate (TOPROL -XL) 25 MG 24 hr tablet Take 1 tablet (25 mg total) by mouth daily. 90 tablet 3 01/17/2024 Morning   mometasone -formoterol  (DULERA ) 200-5 MCG/ACT AERO Inhale 2 puffs by mouth twice daily 13 g 12 01/17/2024 Morning   potassium chloride  (KLOR-CON ) 10 MEQ tablet TAKE 1 TABLET BY MOUTH EVERY OTHER DAY 45 tablet 0 01/16/2024   SPIRIVA  HANDIHALER 18 MCG inhalation capsule INHALE THE CONTENTS OF 1 CAPSULES BY MOUTH ONCE DAILY - DO NOT  SWALLOW CAPSULES 90 capsule 3 01/17/2024 Morning   vitamin B-12 (CYANOCOBALAMIN ) 100 MCG tablet Take 100 mcg by mouth daily.   01/17/2024 Morning   VITAMIN D , CHOLECALCIFEROL , PO Take 1 tablet by mouth in the morning.   01/17/2024 Morning   ACCU-CHEK GUIDE TEST test strip USE TO TEST BLOOD SUGAR 5 TIMES DAILY 450 each 0    Accu-Chek Softclix Lancets lancets Use 1 lancet 3 times daily to check glucose for diabetes 300 each 1    Blood Glucose Monitoring Suppl (ACCU-CHEK GUIDE) w/Device KIT Use as directed Dx e11.65 1 kit 0    Continuous Blood Gluc Receiver (DEXCOM G7 RECEIVER) DEVI Use one as directed for uncontrolled dm 1 each 0    Continuous Glucose Sensor (DEXCOM G7 SENSOR) MISC USE AS DIRECTED. CHANGE EVERY 10 DAYS. 3 each 0    Ensifentrine  (OHTUVAYRE ) 3 MG/2.5ML SUSP Inhale 3 mg into the lungs in the morning and at bedtime. (Patient not taking: Reported on 01/17/2024) 150 mL 11 Not Taking   famotidine  (PEPCID ) 20 MG tablet Take 1 tablet (20 mg total) by mouth daily for 14 days. (Patient taking differently: Take 20 mg by mouth daily as needed for heartburn.) 14 tablet 0    Insulin  Pen Needle (RELION PEN NEEDLES) 31G X 6 MM MISC USE 1 PEN NEEDLE WITH INSULIN  PEN ONCE DAILY FOR DIABETES 50 each 3    methylPREDNISolone  (MEDROL  DOSEPAK) 4 MG TBPK tablet       OXYGEN  Inhale 4 L into the lungs. PT USES [facility] FOR OXYGEN       Social History   Socioeconomic History   Marital status: Widowed    Spouse name: Not on file   Number of children: Not on file   Years of education: Not on file   Highest education level: Not on file  Occupational History   Not on file  Tobacco Use   Smoking status: Former    Current packs/day: 0.00    Average packs/day: 1 pack/day for 37.0 years (37.0 ttl pk-yrs)    Types: Cigarettes    Start date: 02/06/1973    Quit date: 02/06/2010    Years since quitting: 13.9   Smokeless tobacco: Former    Types: Snuff  Vaping Use   Vaping status: Never Used  Substance and Sexual  Activity   Alcohol use: Not Currently    Alcohol/week: 5.0 standard drinks of alcohol    Types: 5 Cans of beer per week    Comment: /h x of alcohol abuse -stopped 2012- now drinks 5 beer per week   Drug use: Not Currently    Types: Marijuana, "Crack" cocaine, Cocaine  Comment: hx of cocaine use- last use 2015; last use marijuana6/22/19,   Sexual activity: Yes  Other Topics Concern   Not on file  Social History Narrative   Lives with Significant Other x 43 years   Social Drivers of Corporate investment banker Strain: Not on file  Food Insecurity: No Food Insecurity (01/17/2024)   Hunger Vital Sign    Worried About Running Out of Food in the Last Year: Never true    Ran Out of Food in the Last Year: Never true  Transportation Needs: No Transportation Needs (01/17/2024)   PRAPARE - Administrator, Civil Service (Medical): No    Lack of Transportation (Non-Medical): No  Physical Activity: Not on file  Stress: Not on file  Social Connections: Socially Integrated (01/17/2024)   Social Connection and Isolation Panel [NHANES]    Frequency of Communication with Friends and Family: More than three times a week    Frequency of Social Gatherings with Friends and Family: More than three times a week    Attends Religious Services: 1 to 4 times per year    Active Member of Clubs or Organizations: No    Attends Banker Meetings: 1 to 4 times per year    Marital Status: Married  Catering manager Violence: Not At Risk (01/17/2024)   Humiliation, Afraid, Rape, and Kick questionnaire    Fear of Current or Ex-Partner: No    Emotionally Abused: No    Physically Abused: No    Sexually Abused: No    Family History  Problem Relation Age of Onset   Diabetes Mother    Hypercholesterolemia Mother    Lung cancer Father    Diabetes Sister    Diabetes Sister    Hypertension Sister    Diabetes Maternal Grandmother    Diabetes Paternal Grandmother    Heart attack Brother     Coronary artery disease Brother    Vascular Disease Brother    Heart attack Brother    Breast cancer Neg Hx      Vitals:   01/18/24 0344 01/18/24 0356 01/18/24 0752 01/18/24 1020  BP: (!) 82/55 (!) 87/56 (!) 78/45 (!) 87/65  Pulse: 90 93 89 96  Resp: 16  19   Temp: (!) 97.5 F (36.4 C)  98.2 F (36.8 C)   TempSrc: Oral  Oral   SpO2: 97%  98% 99%  Weight:      Height:        PHYSICAL EXAM General: chronically ill appearing elderly female, well nourished, in no acute distress. HEENT: Normocephalic and atraumatic. Neck: No JVD.   Lungs: Normal respiratory effort on 3L. Wheezing bilaterally Heart: HRRR. Normal S1 and S2 without gallops or murmurs.  Abdomen: Non-distended appearing.  Msk: Normal strength and tone for age. Extremities: Warm and well perfused. No clubbing, cyanosis. No edema.  Neuro: Alert and oriented X 3. Psych: Answers questions appropriately.   Labs: Basic Metabolic Panel: Recent Labs    01/17/24 0836 01/17/24 1208  NA  --  138  K  --  3.9  CL  --  99  CO2  --  28  GLUCOSE  --  272*  BUN 22 20  CREATININE 0.73 0.76  CALCIUM   --  9.3   Liver Function Tests: Recent Labs    01/17/24 1208  AST 25  ALT 20  ALKPHOS 58  BILITOT 0.3  PROT 7.1  ALBUMIN 3.8   No results for input(s): "LIPASE", "AMYLASE" in the  last 72 hours. CBC: Recent Labs    01/17/24 1208 01/18/24 0355  WBC 8.0 10.9*  HGB 10.6* 10.6*  HCT 34.1* 34.9*  MCV 92.4 92.1  PLT 239 245   Cardiac Enzymes: No results for input(s): "CKTOTAL", "CKMB", "CKMBINDEX", "TROPONINIHS" in the last 72 hours. BNP: No results for input(s): "BNP" in the last 72 hours. D-Dimer: No results for input(s): "DDIMER" in the last 72 hours. Hemoglobin A1C: No results for input(s): "HGBA1C" in the last 72 hours. Fasting Lipid Panel: No results for input(s): "CHOL", "HDL", "LDLCALC", "TRIG", "CHOLHDL", "LDLDIRECT" in the last 72 hours. Thyroid  Function Tests: No results for input(s): "TSH",  "T4TOTAL", "T3FREE", "THYROIDAB" in the last 72 hours.  Invalid input(s): "FREET3" Anemia Panel: No results for input(s): "VITAMINB12", "FOLATE", "FERRITIN", "TIBC", "IRON ", "RETICCTPCT" in the last 72 hours.   Radiology: PERIPHERAL VASCULAR CATHETERIZATION Result Date: 01/17/2024 See surgical note for result.  VAS US  ABI WITH/WO TBI Result Date: 01/06/2024  LOWER EXTREMITY DOPPLER STUDY Patient Name:  Angela Jensen  Date of Exam:   01/05/2024 Medical Rec #: [id number]     Accession #:    [id number] Date of Birth: December 03, 1955     Patient Gender: F Patient Age:   51 years Exam Location:  [facility] Procedure:      VAS US  ABI WITH/WO TBI Referring Phys: Mikki Alexander --------------------------------------------------------------------------------  Indications: Claudication, and peripheral artery disease. Other Factors: Left SFA stents are known to be occluded.                Patient complains of constant numbness in left foot.                 07/29/2023: Aortogram and selective Left Lower                ExtremityAngiogram. Mechanical thrombectomy of the Left SFA and                Popliteal Artery with the [place] Rex device. Stent placement to the                Proximal Left SFA with 6 mm diameter by 5 cm length Viabahn                stent. Stent placement to the Left Popliteal Artery above the                knee with 6 mm diameter by 5 cm length Viabahn stent.                 12/20/2023: Aortogram and Selective Right Lower                ExtremityAngiogram. PTA of the Right SFA with 4 mm diameter by 22                cm length Lutonix drug coated angioplasty balloon. Stent                placement to the Right SFA with 6 mm by 20 cm length Life stent.  Vascular Interventions: 06/01/16: Left distal SFA/popliteal arteries PTAs with                         SFA stent x2;                         06/08/16: Left SFA PTA;  09/29/18: Left SFA stent x2;. Comparison Study: 12/01/2023  Performing Technologist: Tonie Franks RVS  Examination Guidelines: A complete evaluation includes at minimum, Doppler waveform signals and systolic blood pressure reading at the level of bilateral brachial, anterior tibial, and posterior tibial arteries, when vessel segments are accessible. Bilateral testing is considered an integral part of a complete examination. Photoelectric Plethysmograph (PPG) waveforms and toe systolic pressure readings are included as required and additional duplex testing as needed. Limited examinations for reoccurring indications may be performed as noted.  ABI Findings: +---------+------------------+-----+--------+--------+ Right    Rt Pressure (mmHg)IndexWaveformComment  +---------+------------------+-----+--------+--------+ Brachial 115                                     +---------+------------------+-----+--------+--------+ ATA      100               0.87 biphasic         +---------+------------------+-----+--------+--------+ PTA      105               0.91 biphasic         +---------+------------------+-----+--------+--------+ Great Toe108               0.94 Normal           +---------+------------------+-----+--------+--------+ +---------+------------------+-----+-------------------+-------+ Left     Lt Pressure (mmHg)IndexWaveform           Comment +---------+------------------+-----+-------------------+-------+ Brachial 114                                               +---------+------------------+-----+-------------------+-------+ ATA      43                0.37 dampened monophasic        +---------+------------------+-----+-------------------+-------+ PTA                             Non pulsatile flow         +---------+------------------+-----+-------------------+-------+ PERO     46                0.40 dampened monophasic        +---------+------------------+-----+-------------------+-------+ Great Toe32                 0.28                            +---------+------------------+-----+-------------------+-------+ +-------+-----------+-----------+------------+------------+ ABI/TBIToday's ABIToday's TBIPrevious ABIPrevious TBI +-------+-----------+-----------+------------+------------+ Right  .91        .94        .73         .85          +-------+-----------+-----------+------------+------------+ Left   .40        .28        .86         .94          +-------+-----------+-----------+------------+------------+ Right ABIs and TBIs appear increased compared to prior study on 12/01/2023. Left ABIs and TBIs appear decreased compared to prior study on 12/01/2023.  Summary: Right: Resting right ankle-brachial index indicates mild right lower extremity arterial disease. The right toe-brachial index is normal. Left: Resting left ankle-brachial index indicates severe left lower extremity arterial disease. The left toe-brachial index  is abnormal. *See table(s) above for measurements and observations.  Electronically signed by Mikki Alexander MD on 01/06/2024 at 8:12:02 AM.    Final    PERIPHERAL VASCULAR CATHETERIZATION Result Date: 12/20/2023 See surgical note for result.   ECHO 06/15/2023  1. Left ventricular ejection fraction, by estimation, is >55%. The left ventricle has normal function. Left ventricular endocardial border not optimally defined to evaluate regional wall motion. Left ventricular diastolic parameters are consistent with Grade I diastolic dysfunction (impaired relaxation).   2. Right ventricular systolic function is normal. The right ventricular size is normal. Tricuspid regurgitation signal is inadequate for assessing PA pressure.   3. The mitral valve was not well visualized. Trivial mitral valve regurgitation.   4. Tricuspid valve regurgitation is mild to moderate.  5. The aortic valve was not well visualized. Aortic valve regurgitation is not visualized. No aortic stenosis is present.    6. The inferior vena cava is normal in size with greater than 50% respiratory variability, suggesting right atrial pressure of 3 mmHg.   TELEMETRY reviewed by me 01/18/2024: sinus rhythm, rate 80s  EKG reviewed by me: sinus rhythm, RBBB rate 91 bpm  Data reviewed by me 01/18/2024: last 24h vitals tele labs imaging I/O vascular surgery notes, Op note.  Principal Problem:   Ischemic leg    ASSESSMENT AND PLAN:  Angela Jensen is a 68 y.o. female  with a past medical history of chronic HFpEF, SSS s/p micra (06/2021), hypertension, hyperlipidemia, aortic atherosclerosis, IDDM, PVD, COPD/asthma (baseline 2L O2), OSA, tobacco use who presented for outpatient procedure on 01/17/2024 underwent lower extremity angiogram on 06/02 and found that patient will need a left femoral endarterectomy with illiac stents and SFA intervention. Cardiology was consulted for preoperative cardiac evaluation.  #Preop Cardiac Evaluation # PAD with Left common femoral artery stenosis # Hypertension # Hyperlipidemia # Chronic HFpEF # SSS s/p micra (06/2021) Patient with PAD with rest pain LLE underwent left lower extremity angiogram that demonstrated 70% stenosis of left common femoral artery on 06/02. Patient requiring left femoral endarterectomy with illiac stents and SFA intervention. Echo from 05/2023 with pEF and low risk. EKG from today (06/03) with sinus rhythm, RBBB is unchanged from prior EKG, no acute ischemic changes. BP soft this AM -Hold antihypertensive medications (lisinopril -hydrochlorothiazide ) due to soft BP. Resume when BP stabilizes.  -Continue home metoprolol  succinate 25 mg daily with hold parameters. Hold if SBP < 100. -Continue ASA 81 mg, atorvastatin  10 mg daily. -Recommend optimizing BP and respiratory status prior to procedure.   Patient is at elevated but acceptable risk for proceeding with surgery given cardiac history understanding risk/benefits.  Can proceed with left femoral endarterectomy  without further cardiac diagnostics needed.   This patient's plan of care was discussed and created with Dr. Bob Burn and he is in agreement.  Signed: Creighton Doffing, PA-C  01/18/2024, 11:18 AM [facility] Cardiology

## 2024-01-18 NOTE — Consult Note (Signed)
 PHARMACY - ANTICOAGULATION CONSULT NOTE  Pharmacy Consult for heparin   Indication: lower extremity ischemia   Allergies  Allergen Reactions   Iodinated Contrast Media Itching   Trelegy Ellipta  [Fluticasone -Umeclidin-Vilant]     Had breathing issues    Patient Measurements: Height: 4\' 11"  (149.9 cm) Weight: 54 kg (119 lb 0.8 oz) IBW/kg (Calculated) : 43.2 HEPARIN  DW (KG): 54  Vital Signs: Temp: 97.5 F (36.4 C) (06/03 0344) Temp Source: Oral (06/03 0344) BP: 87/56 (06/03 0356) Pulse Rate: 93 (06/03 0356)  Labs: Recent Labs    01/17/24 0836 01/17/24 1208 01/17/24 1805 01/18/24 0355  HGB  --  10.6*  --  10.6*  HCT  --  34.1*  --  34.9*  PLT  --  239  --  245  LABPROT  --  14.0  --   --   INR  --  1.1  --   --   HEPARINUNFRC  --   --  0.33 0.14*  CREATININE 0.73 0.76  --   --     Estimated Creatinine Clearance: 50.5 mL/min (by C-G formula based on SCr of 0.76 mg/dL).   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/2016   Cancer (HCC) 2012   Right Lung CA   COPD (chronic obstructive pulmonary disease) (HCC)    Depression    Diabetes mellitus without complication (HCC)    Patient takes Janumet   Essential hypertension 05/26/2016   Heart failure (HCC) 2022   Hydropneumothorax 05/03/2020   Hypercholesteremia    Oxygen  dependent    2L at nite    PAD (peripheral artery disease) (HCC) 06/22/2016   Peripheral vascular disease (HCC)    Personal history of radiation therapy    Shortness of breath dyspnea    with exertion    Sialolithiasis    Sleep apnea    Wears dentures    full upper and lower    Medications:  Medications Prior to Admission  Medication Sig Dispense Refill Last Dose/Taking   albuterol  (VENTOLIN  HFA) 108 (90 Base) MCG/ACT inhaler INHALE 2 PUFFS BY MOUTH EVERY 6 HOURS AS NEEDED FOR WHEEZING OR SHORTNESS OF  BREATH 3 each 5 01/17/2024 Morning   ALPRAZolam  (XANAX ) 0.5 MG tablet Take 1 tablet (0.5 mg total) by mouth 2 (two) times daily as needed for anxiety. 60 tablet 2 01/17/2024 Morning   aspirin  EC 81 MG tablet Take 1 tablet (81 mg total) by mouth daily. Swallow whole. 150 tablet 2 01/17/2024 Morning   atorvastatin  (LIPITOR) 10 MG tablet Take 1 tablet (10 mg total) by mouth daily. 30 tablet 11 01/17/2024 Morning   calcium  carbonate (OSCAL) 1500 (600 Ca) MG TABS tablet Take 600 mg of elemental calcium  by mouth 2 (two) times daily with a meal.   01/17/2024 Morning   clopidogrel  (PLAVIX ) 75 MG tablet Take 1 tablet (75 mg total) by mouth daily. 90 tablet 3 01/17/2024 Morning   cyclobenzaprine  (FLEXERIL ) 10 MG tablet Take 1 tablet (10 mg total) by mouth at bedtime. Take one tab po qhs for back spasm prn only 30 tablet 3 Past Month   escitalopram  (LEXAPRO ) 10 MG tablet Take 1 tablet (10 mg total) by mouth daily. 90 tablet 1 01/17/2024 Morning   ferrous sulfate  325 (65 FE) MG tablet Take 325 mg by mouth 2 (two) times daily with a meal.   01/17/2024 Morning  furosemide  (LASIX ) 20 MG tablet Take 1 tablet (20 mg total) by mouth daily as needed. 30 tablet 5 01/16/2024   gabapentin  (NEURONTIN ) 300 MG capsule TAKE 1 CAPSULE BY MOUTH THREE TIMES DAILY 90 capsule 0 01/17/2024 Morning   insulin  glargine, 1 Unit Dial , (TOUJEO ) 300 UNIT/ML Solostar Pen Inject 10 Units into the skin daily. 1.5 mL 11 01/17/2024 Morning   ipratropium-albuterol  (DUONEB) 0.5-2.5 (3) MG/3ML SOLN USE 1 AMPULE IN NEBULIZER EVERY 4 HOURS AS NEEDED 360 mL 0 01/17/2024 Morning   lisinopril -hydrochlorothiazide  (ZESTORETIC ) 20-25 MG tablet Take 1 tablet by mouth daily. 90 tablet 3 01/17/2024 Morning   metoprolol  succinate (TOPROL -XL) 25 MG 24 hr tablet Take 1 tablet (25 mg total) by mouth daily. 90 tablet 3 01/17/2024 Morning   mometasone -formoterol  (DULERA ) 200-5 MCG/ACT AERO Inhale 2 puffs by mouth twice daily 13 g 12 01/17/2024 Morning   potassium chloride  (KLOR-CON ) 10 MEQ  tablet TAKE 1 TABLET BY MOUTH EVERY OTHER DAY 45 tablet 0 01/16/2024   SPIRIVA  HANDIHALER 18 MCG inhalation capsule INHALE THE CONTENTS OF 1 CAPSULES BY MOUTH ONCE DAILY - DO NOT SWALLOW CAPSULES 90 capsule 3 01/17/2024 Morning   vitamin B-12 (CYANOCOBALAMIN ) 100 MCG tablet Take 100 mcg by mouth daily.   01/17/2024 Morning   VITAMIN D , CHOLECALCIFEROL , PO Take 1 tablet by mouth in the morning.   01/17/2024 Morning   ACCU-CHEK GUIDE TEST test strip USE TO TEST BLOOD SUGAR 5 TIMES DAILY 450 each 0    Accu-Chek Softclix Lancets lancets Use 1 lancet 3 times daily to check glucose for diabetes 300 each 1    Blood Glucose Monitoring Suppl (ACCU-CHEK GUIDE) w/Device KIT Use as directed Dx e11.65 1 kit 0    Continuous Blood Gluc Receiver (DEXCOM G7 RECEIVER) DEVI Use one as directed for uncontrolled dm 1 each 0    Continuous Glucose Sensor (DEXCOM G7 SENSOR) MISC USE AS DIRECTED. CHANGE EVERY 10 DAYS. 3 each 0    Ensifentrine  (OHTUVAYRE ) 3 MG/2.5ML SUSP Inhale 3 mg into the lungs in the morning and at bedtime. (Patient not taking: Reported on 01/17/2024) 150 mL 11 Not Taking   famotidine  (PEPCID ) 20 MG tablet Take 1 tablet (20 mg total) by mouth daily for 14 days. (Patient taking differently: Take 20 mg by mouth daily as needed for heartburn.) 14 tablet 0    Insulin  Pen Needle (RELION PEN NEEDLES) 31G X 6 MM MISC USE 1 PEN NEEDLE WITH INSULIN  PEN ONCE DAILY FOR DIABETES 50 each 3    methylPREDNISolone  (MEDROL  DOSEPAK) 4 MG TBPK tablet       OXYGEN  Inhale 4 L into the lungs. PT USES ADAPT HEALTH FOR OXYGEN       Scheduled:   aspirin  EC  81 mg Oral Daily   atorvastatin   10 mg Oral Daily   calcium  carbonate  500 mg of elemental calcium  Oral BID WC   cholecalciferol   1,000 Units Oral q AM   cyclobenzaprine   10 mg Oral QHS   escitalopram   10 mg Oral Daily   ferrous sulfate   325 mg Oral BID WC   fluticasone  furoate-vilanterol  1 puff Inhalation Daily   gabapentin   300 mg Oral TID   lisinopril   20 mg Oral Daily    And   hydrochlorothiazide   25 mg Oral Daily   insulin  glargine  10 Units Subcutaneous Daily   metoprolol  succinate  25 mg Oral Daily   pantoprazole  40 mg Oral Daily   potassium chloride   10 mEq Oral QODAY  potassium chloride   20-40 mEq Oral Once   umeclidinium bromide   1 puff Inhalation Daily   vitamin B-12  100 mcg Oral Daily   Infusions:   heparin  550 Units/hr (01/18/24 0313)   PRN: acetaminophen  **OR** acetaminophen , ALPRAZolam , alum & mag hydroxide-simeth, diphenhydrAMINE , famotidine , guaiFENesin -dextromethorphan, hydrALAZINE , ipratropium-albuterol , labetalol , magnesium  hydroxide, metoprolol  tartrate, morphine  injection, ondansetron , oxyCODONE -acetaminophen , phenol, sorbitol Anti-infectives (From admission, onward)    Start     Dose/Rate Route Frequency Ordered Stop   01/17/24 0829  ceFAZolin  (ANCEF ) IVPB 2g/100 mL premix        2 g 200 mL/hr over 30 Minutes Intravenous 30 min pre-op 01/17/24 1610 01/17/24 2113       Assessment: 68 yr old female underwent LE angiography 6/2. 70% stenosis of the left common femoral artery with disease extending down into the proximal profunda femoris artery. No DOAC PTA. PMH: HTN, DM, IDA, PAD. No CBC will order a stat lab.   6/2 1805    HL 0.33, therapeutic x 1 6/2 2145    Hepatin gtt held for 1 hr d/t bleeding 6/3 0355    HL 0.14, SUBtherapeutic  Goal of Therapy:  Heparin  level 0.3-0.7 units/ml Monitor platelets by anticoagulation protocol: Yes   Plan:  6/2 @ 2145:  RN reports pt bleeding at infusion site,  Dr Prescilla Brod wants to hold heparin  drip for 1 hr and restart @ 550 units/hr. - will recheck HL 6 hrs after restart  6/3:  HL @ 0355 = 0.14, SUBtherapeutic - will increase heparin  drip slightly to 600 units/hr but no bolus due recent bleeding - will recheck HL 6 hrs after rate change  - CBC daily  Spenser Cong D 01/18/2024 5:42 AM

## 2024-01-18 NOTE — Consult Note (Addendum)
 PHARMACY - ANTICOAGULATION CONSULT NOTE  Pharmacy Consult for heparin   Indication: lower extremity ischemia   Allergies  Allergen Reactions   Iodinated Contrast Media Itching   Trelegy Ellipta  [Fluticasone -Umeclidin-Vilant]     Had breathing issues    Patient Measurements: Height: 4\' 11"  (149.9 cm) Weight: 54 kg (119 lb 0.8 oz) IBW/kg (Calculated) : 43.2 HEPARIN  DW (KG): 54  Vital Signs: Temp: 98.2 F (36.8 C) (06/03 1538) Temp Source: Oral (06/03 1538) BP: 87/60 (06/03 1538) Pulse Rate: 85 (06/03 1538)  Labs: Recent Labs    01/17/24 0836 01/17/24 1208 01/17/24 1805 01/18/24 0355 01/18/24 1150 01/18/24 1822  HGB  --  10.6*  --  10.6*  --   --   HCT  --  34.1*  --  34.9*  --   --   PLT  --  239  --  245  --   --   LABPROT  --  14.0  --   --   --   --   INR  --  1.1  --   --   --   --   HEPARINUNFRC  --   --    < > 0.14* 0.15* 0.23*  CREATININE 0.73 0.76  --   --   --   --    < > = values in this interval not displayed.    Estimated Creatinine Clearance: 50.5 mL/min (by C-G formula based on SCr of 0.76 mg/dL).   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/2016   Cancer (HCC) 2012   Right Lung CA   COPD (chronic obstructive pulmonary disease) (HCC)    Depression    Diabetes mellitus without complication (HCC)    Patient takes Janumet   Essential hypertension 05/26/2016   Heart failure (HCC) 2022   Hydropneumothorax 05/03/2020   Hypercholesteremia    Oxygen  dependent    2L at nite    PAD (peripheral artery disease) (HCC) 06/22/2016   Peripheral vascular disease (HCC)    Personal history of radiation therapy    Shortness of breath dyspnea    with exertion    Sialolithiasis    Sleep apnea    Wears dentures    full upper and lower    Medications:  Medications Prior to Admission  Medication Sig Dispense Refill  Last Dose/Taking   albuterol  (VENTOLIN  HFA) 108 (90 Base) MCG/ACT inhaler INHALE 2 PUFFS BY MOUTH EVERY 6 HOURS AS NEEDED FOR WHEEZING OR SHORTNESS OF BREATH 3 each 5 01/17/2024 Morning   ALPRAZolam  (XANAX ) 0.5 MG tablet Take 1 tablet (0.5 mg total) by mouth 2 (two) times daily as needed for anxiety. 60 tablet 2 01/17/2024 Morning   aspirin  EC 81 MG tablet Take 1 tablet (81 mg total) by mouth daily. Swallow whole. 150 tablet 2 01/17/2024 Morning   atorvastatin  (LIPITOR) 10 MG tablet Take 1 tablet (10 mg total) by mouth daily. 30 tablet 11 01/17/2024 Morning   calcium  carbonate (OSCAL) 1500 (600 Ca) MG TABS tablet Take 600 mg of elemental calcium  by mouth 2 (two) times daily with a meal.   01/17/2024 Morning   clopidogrel  (PLAVIX ) 75 MG tablet Take 1 tablet (75 mg total) by mouth daily. 90 tablet 3 01/17/2024 Morning   cyclobenzaprine  (FLEXERIL ) 10 MG tablet Take 1 tablet (10 mg total) by mouth at bedtime. Take one tab po qhs for  back spasm prn only 30 tablet 3 Past Month   escitalopram  (LEXAPRO ) 10 MG tablet Take 1 tablet (10 mg total) by mouth daily. 90 tablet 1 01/17/2024 Morning   ferrous sulfate  325 (65 FE) MG tablet Take 325 mg by mouth 2 (two) times daily with a meal.   01/17/2024 Morning   furosemide  (LASIX ) 20 MG tablet Take 1 tablet (20 mg total) by mouth daily as needed. 30 tablet 5 01/16/2024   gabapentin  (NEURONTIN ) 300 MG capsule TAKE 1 CAPSULE BY MOUTH THREE TIMES DAILY 90 capsule 0 01/17/2024 Morning   insulin  glargine, 1 Unit Dial , (TOUJEO ) 300 UNIT/ML Solostar Pen Inject 10 Units into the skin daily. 1.5 mL 11 01/17/2024 Morning   ipratropium-albuterol  (DUONEB) 0.5-2.5 (3) MG/3ML SOLN USE 1 AMPULE IN NEBULIZER EVERY 4 HOURS AS NEEDED 360 mL 0 01/17/2024 Morning   lisinopril -hydrochlorothiazide  (ZESTORETIC ) 20-25 MG tablet Take 1 tablet by mouth daily. 90 tablet 3 01/17/2024 Morning   metoprolol  succinate (TOPROL -XL) 25 MG 24 hr tablet Take 1 tablet (25 mg total) by mouth daily. 90 tablet 3 01/17/2024 Morning    mometasone -formoterol  (DULERA ) 200-5 MCG/ACT AERO Inhale 2 puffs by mouth twice daily 13 g 12 01/17/2024 Morning   potassium chloride  (KLOR-CON ) 10 MEQ tablet TAKE 1 TABLET BY MOUTH EVERY OTHER DAY 45 tablet 0 01/16/2024   SPIRIVA  HANDIHALER 18 MCG inhalation capsule INHALE THE CONTENTS OF 1 CAPSULES BY MOUTH ONCE DAILY - DO NOT SWALLOW CAPSULES 90 capsule 3 01/17/2024 Morning   vitamin B-12 (CYANOCOBALAMIN ) 100 MCG tablet Take 100 mcg by mouth daily.   01/17/2024 Morning   VITAMIN D , CHOLECALCIFEROL , PO Take 1 tablet by mouth in the morning.   01/17/2024 Morning   ACCU-CHEK GUIDE TEST test strip USE TO TEST BLOOD SUGAR 5 TIMES DAILY 450 each 0    Accu-Chek Softclix Lancets lancets Use 1 lancet 3 times daily to check glucose for diabetes 300 each 1    Blood Glucose Monitoring Suppl (ACCU-CHEK GUIDE) w/Device KIT Use as directed Dx e11.65 1 kit 0    Continuous Blood Gluc Receiver (DEXCOM G7 RECEIVER) DEVI Use one as directed for uncontrolled dm 1 each 0    Continuous Glucose Sensor (DEXCOM G7 SENSOR) MISC USE AS DIRECTED. CHANGE EVERY 10 DAYS. 3 each 0    Ensifentrine  (OHTUVAYRE ) 3 MG/2.5ML SUSP Inhale 3 mg into the lungs in the morning and at bedtime. (Patient not taking: Reported on 01/17/2024) 150 mL 11 Not Taking   famotidine  (PEPCID ) 20 MG tablet Take 1 tablet (20 mg total) by mouth daily for 14 days. (Patient taking differently: Take 20 mg by mouth daily as needed for heartburn.) 14 tablet 0    Insulin  Pen Needle (RELION PEN NEEDLES) 31G X 6 MM MISC USE 1 PEN NEEDLE WITH INSULIN  PEN ONCE DAILY FOR DIABETES 50 each 3    methylPREDNISolone  (MEDROL  DOSEPAK) 4 MG TBPK tablet       OXYGEN  Inhale 4 L into the lungs. PT USES ADAPT HEALTH FOR OXYGEN       Scheduled:   aspirin  EC  81 mg Oral Daily   atorvastatin   10 mg Oral Daily   calcium  carbonate  500 mg of elemental calcium  Oral BID WC   cholecalciferol   1,000 Units Oral q AM   escitalopram   10 mg Oral Daily   ferrous sulfate   325 mg Oral BID WC    fluticasone  furoate-vilanterol  1 puff Inhalation Daily   gabapentin   300 mg Oral TID   insulin  aspart  0-5 Units  Subcutaneous QHS   insulin  aspart  0-6 Units Subcutaneous TID WC   insulin  glargine  10 Units Subcutaneous Daily   metoprolol  succinate  25 mg Oral Daily   pantoprazole  40 mg Oral Daily   potassium chloride   10 mEq Oral QODAY   potassium chloride   20-40 mEq Oral Once   umeclidinium bromide   1 puff Inhalation Daily   vitamin B-12  100 mcg Oral Daily   Infusions:   heparin  650 Units/hr (01/18/24 1341)   PRN: acetaminophen  **OR** acetaminophen , ALPRAZolam , alum & mag hydroxide-simeth, cyclobenzaprine , diphenhydrAMINE , famotidine , guaiFENesin -dextromethorphan, hydrALAZINE , ipratropium-albuterol , labetalol , magnesium  hydroxide, metoprolol  tartrate, morphine  injection, ondansetron , oxyCODONE -acetaminophen , phenol, sorbitol Anti-infectives (From admission, onward)    Start     Dose/Rate Route Frequency Ordered Stop   01/17/24 0829  ceFAZolin  (ANCEF ) IVPB 2g/100 mL premix        2 g 200 mL/hr over 30 Minutes Intravenous 30 min pre-op 01/17/24 0829 01/17/24 2113       Assessment: 68 yr old female underwent LE angiography 6/2. 70% stenosis of the left common femoral artery with disease extending down into the proximal profunda femoris artery. No DOAC PTA. PMH: HTN, DM, IDA, PAD. No CBC will order a stat lab.   6/2 1805    HL 0.33, therapeutic x 1  650 units/hr 6/2 2145    Hepatin gtt held for 1 hr d/t bleeding at infusion site 6/3 0355    HL 0.14, SUBtherapeutic @ 550 units/hr 6/3 1150    HL 0.15, SUBtherapeutic @ 600 units/hr 6/3 1822    HL 0.23, SUBtherapeutic @ 650 units/hr  Goal of Therapy:  Heparin  level 0.3-0.7 units/ml Monitor platelets by anticoagulation protocol: Yes   Plan:  6/3 1822    HL 0.23, SUBtherapeutic  -No Bolus due to recent bleeding - will increase heparin  drip slightly to 700 units/hr  - will recheck HL 6 hrs after rate change  - CBC  daily  Thomasine Flick PharmD Clinical Pharmacist 01/18/2024

## 2024-01-18 NOTE — Consult Note (Signed)
 PHARMACY - ANTICOAGULATION CONSULT NOTE  Pharmacy Consult for heparin   Indication: lower extremity ischemia   Allergies  Allergen Reactions   Iodinated Contrast Media Itching   Trelegy Ellipta  [Fluticasone -Umeclidin-Vilant]     Had breathing issues    Patient Measurements: Height: 4\' 11"  (149.9 cm) Weight: 54 kg (119 lb 0.8 oz) IBW/kg (Calculated) : 43.2 HEPARIN  DW (KG): 54  Vital Signs: Temp: 98.2 F (36.8 C) (06/03 0752) Temp Source: Oral (06/03 0752) BP: 87/65 (06/03 1020) Pulse Rate: 96 (06/03 1020)  Labs: Recent Labs    01/17/24 0836 01/17/24 1208 01/17/24 1805 01/18/24 0355 01/18/24 1150  HGB  --  10.6*  --  10.6*  --   HCT  --  34.1*  --  34.9*  --   PLT  --  239  --  245  --   LABPROT  --  14.0  --   --   --   INR  --  1.1  --   --   --   HEPARINUNFRC  --   --  0.33 0.14* 0.15*  CREATININE 0.73 0.76  --   --   --     Estimated Creatinine Clearance: 50.5 mL/min (by C-G formula based on SCr of 0.76 mg/dL).   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/2016   Cancer (HCC) 2012   Right Lung CA   COPD (chronic obstructive pulmonary disease) (HCC)    Depression    Diabetes mellitus without complication (HCC)    Patient takes Janumet   Essential hypertension 05/26/2016   Heart failure (HCC) 2022   Hydropneumothorax 05/03/2020   Hypercholesteremia    Oxygen  dependent    2L at nite    PAD (peripheral artery disease) (HCC) 06/22/2016   Peripheral vascular disease (HCC)    Personal history of radiation therapy    Shortness of breath dyspnea    with exertion    Sialolithiasis    Sleep apnea    Wears dentures    full upper and lower    Medications:  Medications Prior to Admission  Medication Sig Dispense Refill Last Dose/Taking   albuterol  (VENTOLIN  HFA) 108 (90 Base) MCG/ACT inhaler INHALE 2 PUFFS BY MOUTH  EVERY 6 HOURS AS NEEDED FOR WHEEZING OR SHORTNESS OF BREATH 3 each 5 01/17/2024 Morning   ALPRAZolam  (XANAX ) 0.5 MG tablet Take 1 tablet (0.5 mg total) by mouth 2 (two) times daily as needed for anxiety. 60 tablet 2 01/17/2024 Morning   aspirin  EC 81 MG tablet Take 1 tablet (81 mg total) by mouth daily. Swallow whole. 150 tablet 2 01/17/2024 Morning   atorvastatin  (LIPITOR) 10 MG tablet Take 1 tablet (10 mg total) by mouth daily. 30 tablet 11 01/17/2024 Morning   calcium  carbonate (OSCAL) 1500 (600 Ca) MG TABS tablet Take 600 mg of elemental calcium  by mouth 2 (two) times daily with a meal.   01/17/2024 Morning   clopidogrel  (PLAVIX ) 75 MG tablet Take 1 tablet (75 mg total) by mouth daily. 90 tablet 3 01/17/2024 Morning   cyclobenzaprine  (FLEXERIL ) 10 MG tablet Take 1 tablet (10 mg total) by mouth at bedtime. Take one tab po qhs for back spasm prn only 30 tablet 3 Past Month   escitalopram  (LEXAPRO ) 10 MG tablet Take 1 tablet (10 mg total) by mouth daily. 90 tablet 1 01/17/2024 Morning   ferrous sulfate  325 (  65 FE) MG tablet Take 325 mg by mouth 2 (two) times daily with a meal.   01/17/2024 Morning   furosemide  (LASIX ) 20 MG tablet Take 1 tablet (20 mg total) by mouth daily as needed. 30 tablet 5 01/16/2024   gabapentin  (NEURONTIN ) 300 MG capsule TAKE 1 CAPSULE BY MOUTH THREE TIMES DAILY 90 capsule 0 01/17/2024 Morning   insulin  glargine, 1 Unit Dial , (TOUJEO ) 300 UNIT/ML Solostar Pen Inject 10 Units into the skin daily. 1.5 mL 11 01/17/2024 Morning   ipratropium-albuterol  (DUONEB) 0.5-2.5 (3) MG/3ML SOLN USE 1 AMPULE IN NEBULIZER EVERY 4 HOURS AS NEEDED 360 mL 0 01/17/2024 Morning   lisinopril -hydrochlorothiazide  (ZESTORETIC ) 20-25 MG tablet Take 1 tablet by mouth daily. 90 tablet 3 01/17/2024 Morning   metoprolol  succinate (TOPROL -XL) 25 MG 24 hr tablet Take 1 tablet (25 mg total) by mouth daily. 90 tablet 3 01/17/2024 Morning   mometasone -formoterol  (DULERA ) 200-5 MCG/ACT AERO Inhale 2 puffs by mouth twice daily 13 g 12  01/17/2024 Morning   potassium chloride  (KLOR-CON ) 10 MEQ tablet TAKE 1 TABLET BY MOUTH EVERY OTHER DAY 45 tablet 0 01/16/2024   SPIRIVA  HANDIHALER 18 MCG inhalation capsule INHALE THE CONTENTS OF 1 CAPSULES BY MOUTH ONCE DAILY - DO NOT SWALLOW CAPSULES 90 capsule 3 01/17/2024 Morning   vitamin B-12 (CYANOCOBALAMIN ) 100 MCG tablet Take 100 mcg by mouth daily.   01/17/2024 Morning   VITAMIN D , CHOLECALCIFEROL , PO Take 1 tablet by mouth in the morning.   01/17/2024 Morning   ACCU-CHEK GUIDE TEST test strip USE TO TEST BLOOD SUGAR 5 TIMES DAILY 450 each 0    Accu-Chek Softclix Lancets lancets Use 1 lancet 3 times daily to check glucose for diabetes 300 each 1    Blood Glucose Monitoring Suppl (ACCU-CHEK GUIDE) w/Device KIT Use as directed Dx e11.65 1 kit 0    Continuous Blood Gluc Receiver (DEXCOM G7 RECEIVER) DEVI Use one as directed for uncontrolled dm 1 each 0    Continuous Glucose Sensor (DEXCOM G7 SENSOR) MISC USE AS DIRECTED. CHANGE EVERY 10 DAYS. 3 each 0    Ensifentrine  (OHTUVAYRE ) 3 MG/2.5ML SUSP Inhale 3 mg into the lungs in the morning and at bedtime. (Patient not taking: Reported on 01/17/2024) 150 mL 11 Not Taking   famotidine  (PEPCID ) 20 MG tablet Take 1 tablet (20 mg total) by mouth daily for 14 days. (Patient taking differently: Take 20 mg by mouth daily as needed for heartburn.) 14 tablet 0    Insulin  Pen Needle (RELION PEN NEEDLES) 31G X 6 MM MISC USE 1 PEN NEEDLE WITH INSULIN  PEN ONCE DAILY FOR DIABETES 50 each 3    methylPREDNISolone  (MEDROL  DOSEPAK) 4 MG TBPK tablet       OXYGEN  Inhale 4 L into the lungs. PT USES ADAPT HEALTH FOR OXYGEN       Scheduled:   aspirin  EC  81 mg Oral Daily   atorvastatin   10 mg Oral Daily   calcium  carbonate  500 mg of elemental calcium  Oral BID WC   cholecalciferol   1,000 Units Oral q AM   escitalopram   10 mg Oral Daily   ferrous sulfate   325 mg Oral BID WC   fluticasone  furoate-vilanterol  1 puff Inhalation Daily   gabapentin   300 mg Oral TID   insulin   aspart  0-5 Units Subcutaneous QHS   insulin  aspart  0-6 Units Subcutaneous TID WC   insulin  glargine  10 Units Subcutaneous Daily   metoprolol  succinate  25 mg Oral Daily   pantoprazole  40  mg Oral Daily   potassium chloride   10 mEq Oral QODAY   potassium chloride   20-40 mEq Oral Once   umeclidinium bromide   1 puff Inhalation Daily   vitamin B-12  100 mcg Oral Daily   Infusions:   heparin  600 Units/hr (01/18/24 0558)   PRN: acetaminophen  **OR** acetaminophen , ALPRAZolam , alum & mag hydroxide-simeth, cyclobenzaprine , diphenhydrAMINE , famotidine , guaiFENesin -dextromethorphan, hydrALAZINE , ipratropium-albuterol , labetalol , magnesium  hydroxide, metoprolol  tartrate, morphine  injection, ondansetron , oxyCODONE -acetaminophen , phenol, sorbitol Anti-infectives (From admission, onward)    Start     Dose/Rate Route Frequency Ordered Stop   01/17/24 0829  ceFAZolin  (ANCEF ) IVPB 2g/100 mL premix        2 g 200 mL/hr over 30 Minutes Intravenous 30 min pre-op 01/17/24 8119 01/17/24 2113       Assessment: 68 yr old female underwent LE angiography 6/2. 70% stenosis of the left common femoral artery with disease extending down into the proximal profunda femoris artery. No DOAC PTA. PMH: HTN, DM, IDA, PAD. No CBC will order a stat lab.   6/2 1805    HL 0.33, therapeutic x 1  650 units/hr 6/2 2145    Hepatin gtt held for 1 hr d/t bleeding at infusion site 6/3 0355    HL 0.14, SUBtherapeutic 550 units/hr 6/3 1150    HL 0.15, SUBtherapeutic 600 units/hr  Goal of Therapy:  Heparin  level 0.3-0.7 units/ml Monitor platelets by anticoagulation protocol: Yes   Plan:  6/3 1150    HL 0.15, SUBtherapeutic - will increase heparin  drip slightly to 650 units/hr but no bolus due recent bleeding - will recheck HL 6 hrs after rate change  - CBC daily  Thomasine Flick PharmD Clinical Pharmacist 01/18/2024

## 2024-01-18 NOTE — Progress Notes (Signed)
 Progress Note    01/18/2024 11:11 AM 1 Day Post-Op  Subjective:  Angela Jensen is a 68 yo female now POD #1 from left lower extremity angiogram. Patient had issues with HTN yesterday after the procedure and was treated with labetalol  and Hydralazine . This morning patient blood pressure  was low and she was dizzy. Bolus of LR 500's cc's given. She was also given some benadryl  for itching from the use of oxycodone . Therefore she is sleepy. Daughter is at the bedside this morning. No complaints overnight. Vitals remain stable.   Review of Systems  Constitutional:  Constitutional negative. HENT: HENT negative.  Eyes: Eyes negative.  Respiratory: Respiratory negative.  Cardiovascular: Positive for claudication.  GI: Gastrointestinal negative.  GU: Genitourinary negative. Musculoskeletal: Positive for leg pain.  Skin: Skin negative.  Neurological: Positive for dizziness.  Hematologic: Hematologic/lymphatic negative.  Psychiatric: Psychiatric negative.  All other systems reviewed and are negative   Vitals:   01/18/24 0752 01/18/24 1020  BP: (!) 78/45 (!) 87/65  Pulse: 89 96  Resp: 19   Temp: 98.2 F (36.8 C)   SpO2: 98% 99%   Physical Exam: Cardiac:  RRR, Normal S1,S2. No rubs clicks gallup's or murmurs.  Lungs:  Clear throughout on auscultation, normal labored breathing. No rales rhonchi or wheezing Incisions:  Right groin puncture site dressing clean dry and intact. No hematoma, seroma or infection to note.                     Extremities:  All extremities warm to touch with palpable pulses except the left lower extremity.  Left lower extremity is cool to touch and unable to palpate pulses. Abdomen: Positive bowel sounds throughout, soft, nondistended and nontender. Neurologic: Alert and oriented x 3, answers questions but very sleepy this morning.  CBC    Component Value Date/Time   WBC 10.9 (H) 01/18/2024 0355   RBC 3.79 (L) 01/18/2024 0355   HGB 10.6 (L) 01/18/2024 0355    HGB 12.7 10/08/2023 1004   HGB 11.6 10/04/2023 1047   HCT 34.9 (L) 01/18/2024 0355   HCT 37.6 10/04/2023 1047   PLT 245 01/18/2024 0355   PLT 264 10/08/2023 1004   PLT 236 10/04/2023 1047   MCV 92.1 01/18/2024 0355   MCV 88 10/04/2023 1047   MCV 90 04/17/2014 1055   MCH 28.0 01/18/2024 0355   MCHC 30.4 01/18/2024 0355   RDW 14.0 01/18/2024 0355   RDW 12.9 10/04/2023 1047   RDW 13.6 04/17/2014 1055   LYMPHSABS 1.0 10/08/2023 1004   LYMPHSABS 0.9 10/04/2023 1047   LYMPHSABS 1.1 04/17/2014 1055   MONOABS 0.5 10/08/2023 1004   MONOABS 0.3 04/17/2014 1055   EOSABS 0.1 10/08/2023 1004   EOSABS 0.1 10/04/2023 1047   EOSABS 0.2 04/17/2014 1055   BASOSABS 0.0 10/08/2023 1004   BASOSABS 0.0 10/04/2023 1047   BASOSABS 0.1 04/17/2014 1055    BMET    Component Value Date/Time   NA 138 01/17/2024 1208   NA 144 10/04/2023 1047   NA 141 04/17/2014 1055   K 3.9 01/17/2024 1208   K 3.9 04/17/2014 1055   CL 99 01/17/2024 1208   CL 102 04/17/2014 1055   CO2 28 01/17/2024 1208   CO2 30 04/17/2014 1055   GLUCOSE 272 (H) 01/17/2024 1208   GLUCOSE 201 (H) 04/17/2014 1055   BUN 20 01/17/2024 1208   BUN 12 10/04/2023 1047   BUN 10 04/17/2014 1055   CREATININE 0.76 01/17/2024 1208  CREATININE 0.84 07/06/2023 1016   CREATININE 0.90 04/17/2014 1055   CALCIUM  9.3 01/17/2024 1208   CALCIUM  9.1 04/17/2014 1055   GFRNONAA >60 01/17/2024 1208   GFRNONAA >60 07/06/2023 1016   GFRNONAA >60 04/17/2014 1055   GFRAA >60 05/05/2020 0558   GFRAA >60 04/17/2014 1055    INR    Component Value Date/Time   INR 1.1 01/17/2024 1208     Intake/Output Summary (Last 24 hours) at 01/18/2024 1111 Last data filed at 01/18/2024 1019 Gross per 24 hour  Intake 145.2 ml  Output 500 ml  Net -354.8 ml     Assessment/Plan:  68 y.o. female is s/p left lower extremity angiogram 1 Day Post-Op   PLAN Vascular surgery plans on taking the patient to the operating room tomorrow for left lower extremity femoral  endarterectomy with iliac stent placement.  I discussed in detail with the patient and daughter at the bedside this morning the procedure, benefits, risk, complications.  They both verbalized her understanding.  Answered all their questions this morning.  Patient was made n.p.o. after midnight tonight for operating room tomorrow.  DVT prophylaxis: ASA 81 mg daily, Lipitor 10 mg daily and heparin  infusion.   Annamaria [facility]rette [facility] 01/18/2024 11:11 AM

## 2024-01-18 NOTE — Plan of Care (Signed)
  Problem: Education: Goal: Knowledge of General Education information will improve Description: Including pain rating scale, medication(s)/side effects and non-pharmacologic comfort measures Outcome: Progressing   Problem: Clinical Measurements: Goal: Ability to maintain clinical measurements within normal limits will improve Outcome: Progressing Goal: Will remain free from infection Outcome: Progressing Goal: Diagnostic test results will improve Outcome: Progressing Goal: Respiratory complications will improve Outcome: Progressing Goal: Cardiovascular complication will be avoided Outcome: Progressing   Problem: Activity: Goal: Risk for activity intolerance will decrease Outcome: Progressing   Problem: Nutrition: Goal: Adequate nutrition will be maintained Outcome: Progressing   Problem: Coping: Goal: Level of anxiety will decrease Outcome: Progressing   

## 2024-01-19 DIAGNOSIS — I959 Hypotension, unspecified: Secondary | ICD-10-CM | POA: Diagnosis not present

## 2024-01-19 DIAGNOSIS — I70222 Atherosclerosis of native arteries of extremities with rest pain, left leg: Secondary | ICD-10-CM | POA: Diagnosis not present

## 2024-01-19 LAB — GLUCOSE, CAPILLARY
Glucose-Capillary: 72 mg/dL (ref 70–99)
Glucose-Capillary: 89 mg/dL (ref 70–99)
Glucose-Capillary: 77 mg/dL (ref 70–99)
Glucose-Capillary: 66 mg/dL — ABNORMAL LOW (ref 70–99)
Glucose-Capillary: 221 mg/dL — ABNORMAL HIGH (ref 70–99)
Glucose-Capillary: 128 mg/dL — ABNORMAL HIGH (ref 70–99)

## 2024-01-19 LAB — CBC
HCT: 35.4 % — ABNORMAL LOW (ref 36.0–46.0)
Hemoglobin: 11 g/dL — ABNORMAL LOW (ref 12.0–15.0)
MCH: 28.7 pg (ref 26.0–34.0)
MCHC: 31.1 g/dL (ref 30.0–36.0)
MCV: 92.4 fL (ref 80.0–100.0)
Platelets: 254 10*3/uL (ref 150–400)
RBC: 3.83 MIL/uL — ABNORMAL LOW (ref 3.87–5.11)
RDW: 14.4 % (ref 11.5–15.5)
WBC: 8.4 10*3/uL (ref 4.0–10.5)
nRBC: 0 % (ref 0.0–0.2)

## 2024-01-19 LAB — HEPARIN LEVEL (UNFRACTIONATED)
Heparin Unfractionated: 0.27 [IU]/mL — ABNORMAL LOW (ref 0.30–0.70)
Heparin Unfractionated: 0.3 [IU]/mL (ref 0.30–0.70)
Heparin Unfractionated: 0.38 [IU]/mL (ref 0.30–0.70)

## 2024-01-19 MED ORDER — INSULIN GLARGINE-YFGN 100 UNIT/ML ~~LOC~~ SOLN
10.0000 [IU] | Freq: Every day | SUBCUTANEOUS | Status: DC
  Filled 2024-01-19 (×2): qty 0.1

## 2024-01-19 MED ORDER — DEXTROSE 50 % IV SOLN
1.0000 | Freq: Once | INTRAVENOUS | Status: AC
  Administered 2024-01-19: 50 mL via INTRAVENOUS
  Filled 2024-01-19: qty 50

## 2024-01-19 NOTE — Consult Note (Signed)
 PHARMACY - ANTICOAGULATION CONSULT NOTE  Pharmacy Consult for heparin   Indication: lower extremity ischemia   Allergies  Allergen Reactions   Iodinated Contrast Media Itching   Trelegy Ellipta  [Fluticasone -Umeclidin-Vilant]     Had breathing issues    Patient Measurements: Height: 4\' 11"  (149.9 cm) Weight: 54 kg (119 lb 0.8 oz) IBW/kg (Calculated) : 43.2 HEPARIN  DW (KG): 54  Vital Signs: Temp: 98.4 F (36.9 C) (06/03 1932) Temp Source: Oral (06/03 1932) BP: 101/51 (06/03 1932) Pulse Rate: 85 (06/03 1932)  Labs: Recent Labs     0000 01/17/24 0836 01/17/24 1208 01/17/24 1805 01/18/24 0355 01/18/24 1150 01/18/24 1822 01/19/24 0207  HGB   < >  --  10.6*  --  10.6*  --   --  11.0*  HCT  --   --  34.1*  --  34.9*  --   --  35.4*  PLT  --   --  239  --  245  --   --  254  LABPROT  --   --  14.0  --   --   --   --   --   INR  --   --  1.1  --   --   --   --   --   HEPARINUNFRC  --   --   --    < > 0.14* 0.15* 0.23* 0.27*  CREATININE  --  0.73 0.76  --   --   --   --   --    < > = values in this interval not displayed.    Estimated Creatinine Clearance: 50.5 mL/min (by C-G formula based on SCr of 0.76 mg/dL).   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/2016   Cancer (HCC) 2012   Right Lung CA   COPD (chronic obstructive pulmonary disease) (HCC)    Depression    Diabetes mellitus without complication (HCC)    Patient takes Janumet   Essential hypertension 05/26/2016   Heart failure (HCC) 2022   Hydropneumothorax 05/03/2020   Hypercholesteremia    Oxygen  dependent    2L at nite    PAD (peripheral artery disease) (HCC) 06/22/2016   Peripheral vascular disease (HCC)    Personal history of radiation therapy    Shortness of breath dyspnea    with exertion    Sialolithiasis    Sleep apnea    Wears dentures    full upper  and lower    Medications:  Medications Prior to Admission  Medication Sig Dispense Refill Last Dose/Taking   albuterol  (VENTOLIN  HFA) 108 (90 Base) MCG/ACT inhaler INHALE 2 PUFFS BY MOUTH EVERY 6 HOURS AS NEEDED FOR WHEEZING OR SHORTNESS OF BREATH 3 each 5 01/17/2024 Morning   ALPRAZolam  (XANAX ) 0.5 MG tablet Take 1 tablet (0.5 mg total) by mouth 2 (two) times daily as needed for anxiety. 60 tablet 2 01/17/2024 Morning   aspirin  EC 81 MG tablet Take 1 tablet (81 mg total) by mouth daily. Swallow whole. 150 tablet 2 01/17/2024 Morning   atorvastatin  (LIPITOR) 10 MG tablet Take 1 tablet (10 mg total) by mouth daily. 30 tablet 11 01/17/2024 Morning   calcium  carbonate (OSCAL) 1500 (600 Ca) MG TABS tablet Take 600 mg of elemental calcium  by mouth 2 (two) times daily with a meal.   01/17/2024 Morning   clopidogrel  (PLAVIX ) 75  MG tablet Take 1 tablet (75 mg total) by mouth daily. 90 tablet 3 01/17/2024 Morning   cyclobenzaprine  (FLEXERIL ) 10 MG tablet Take 1 tablet (10 mg total) by mouth at bedtime. Take one tab po qhs for back spasm prn only 30 tablet 3 Past Month   escitalopram  (LEXAPRO ) 10 MG tablet Take 1 tablet (10 mg total) by mouth daily. 90 tablet 1 01/17/2024 Morning   ferrous sulfate  325 (65 FE) MG tablet Take 325 mg by mouth 2 (two) times daily with a meal.   01/17/2024 Morning   furosemide  (LASIX ) 20 MG tablet Take 1 tablet (20 mg total) by mouth daily as needed. 30 tablet 5 01/16/2024   gabapentin  (NEURONTIN ) 300 MG capsule TAKE 1 CAPSULE BY MOUTH THREE TIMES DAILY 90 capsule 0 01/17/2024 Morning   insulin  glargine, 1 Unit Dial , (TOUJEO ) 300 UNIT/ML Solostar Pen Inject 10 Units into the skin daily. 1.5 mL 11 01/17/2024 Morning   ipratropium-albuterol  (DUONEB) 0.5-2.5 (3) MG/3ML SOLN USE 1 AMPULE IN NEBULIZER EVERY 4 HOURS AS NEEDED 360 mL 0 01/17/2024 Morning   lisinopril -hydrochlorothiazide  (ZESTORETIC ) 20-25 MG tablet Take 1 tablet by mouth daily. 90 tablet 3 01/17/2024 Morning   metoprolol  succinate (TOPROL -XL)  25 MG 24 hr tablet Take 1 tablet (25 mg total) by mouth daily. 90 tablet 3 01/17/2024 Morning   mometasone -formoterol  (DULERA ) 200-5 MCG/ACT AERO Inhale 2 puffs by mouth twice daily 13 g 12 01/17/2024 Morning   potassium chloride  (KLOR-CON ) 10 MEQ tablet TAKE 1 TABLET BY MOUTH EVERY OTHER DAY 45 tablet 0 01/16/2024   SPIRIVA  HANDIHALER 18 MCG inhalation capsule INHALE THE CONTENTS OF 1 CAPSULES BY MOUTH ONCE DAILY - DO NOT SWALLOW CAPSULES 90 capsule 3 01/17/2024 Morning   vitamin B-12 (CYANOCOBALAMIN ) 100 MCG tablet Take 100 mcg by mouth daily.   01/17/2024 Morning   VITAMIN D , CHOLECALCIFEROL , PO Take 1 tablet by mouth in the morning.   01/17/2024 Morning   ACCU-CHEK GUIDE TEST test strip USE TO TEST BLOOD SUGAR 5 TIMES DAILY 450 each 0    Accu-Chek Softclix Lancets lancets Use 1 lancet 3 times daily to check glucose for diabetes 300 each 1    Blood Glucose Monitoring Suppl (ACCU-CHEK GUIDE) w/Device KIT Use as directed Dx e11.65 1 kit 0    Continuous Blood Gluc Receiver (DEXCOM G7 RECEIVER) DEVI Use one as directed for uncontrolled dm 1 each 0    Continuous Glucose Sensor (DEXCOM G7 SENSOR) MISC USE AS DIRECTED. CHANGE EVERY 10 DAYS. 3 each 0    Ensifentrine  (OHTUVAYRE ) 3 MG/2.5ML SUSP Inhale 3 mg into the lungs in the morning and at bedtime. (Patient not taking: Reported on 01/17/2024) 150 mL 11 Not Taking   famotidine  (PEPCID ) 20 MG tablet Take 1 tablet (20 mg total) by mouth daily for 14 days. (Patient taking differently: Take 20 mg by mouth daily as needed for heartburn.) 14 tablet 0    Insulin  Pen Needle (RELION PEN NEEDLES) 31G X 6 MM MISC USE 1 PEN NEEDLE WITH INSULIN  PEN ONCE DAILY FOR DIABETES 50 each 3    methylPREDNISolone  (MEDROL  DOSEPAK) 4 MG TBPK tablet       OXYGEN  Inhale 4 L into the lungs. PT USES ADAPT HEALTH FOR OXYGEN       Scheduled:   aspirin  EC  81 mg Oral Daily   atorvastatin   10 mg Oral Daily   calcium  carbonate  500 mg of elemental calcium  Oral BID WC   cholecalciferol   1,000 Units  Oral q AM   escitalopram   10 mg Oral Daily   ferrous sulfate   325 mg Oral BID WC   fluticasone  furoate-vilanterol  1 puff Inhalation Daily   gabapentin   300 mg Oral TID   insulin  aspart  0-5 Units Subcutaneous QHS   insulin  aspart  0-6 Units Subcutaneous TID WC   insulin  glargine  10 Units Subcutaneous Daily   metoprolol  succinate  25 mg Oral Daily   pantoprazole  40 mg Oral Daily   potassium chloride   10 mEq Oral QODAY   potassium chloride   20-40 mEq Oral Once   umeclidinium bromide   1 puff Inhalation Daily   vitamin B-12  100 mcg Oral Daily   Infusions:   heparin  700 Units/hr (01/19/24 0035)   PRN: acetaminophen  **OR** acetaminophen , ALPRAZolam , alum & mag hydroxide-simeth, cyclobenzaprine , diphenhydrAMINE , famotidine , guaiFENesin -dextromethorphan, hydrALAZINE , ipratropium-albuterol , labetalol , magnesium  hydroxide, metoprolol  tartrate, morphine  injection, ondansetron , oxyCODONE -acetaminophen , phenol, sorbitol Anti-infectives (From admission, onward)    Start     Dose/Rate Route Frequency Ordered Stop   01/17/24 0829  ceFAZolin  (ANCEF ) IVPB 2g/100 mL premix        2 g 200 mL/hr over 30 Minutes Intravenous 30 min pre-op 01/17/24 0981 01/17/24 2113       Assessment: 68 yr old female underwent LE angiography 6/2. 70% stenosis of the left common femoral artery with disease extending down into the proximal profunda femoris artery. No DOAC PTA. PMH: HTN, DM, IDA, PAD. No CBC will order a stat lab.   6/2 1805    HL 0.33, therapeutic x 1  650 units/hr 6/2 2145    Hepatin gtt held for 1 hr d/t bleeding at infusion site 6/3 0355    HL 0.14, SUBtherapeutic @ 550 units/hr 6/3 1150    HL 0.15, SUBtherapeutic @ 600 units/hr 6/3 1822    HL 0.23, SUBtherapeutic @ 650 units/hr 6/4 0207    HL 0.27, SUBtherapeutic @ 700 units/hr  Goal of Therapy:  Heparin  level 0.3-0.7 units/ml Monitor platelets by anticoagulation protocol: Yes   Plan:  6/4:  HL @ 0207 = 0.27, SUBtherapeutic  -No Bolus due  to recent bleeding - will increase heparin  drip slightly to 750 units/hr  - will recheck HL 6 hrs after rate change  - CBC daily  Aleysha Meckler D Clinical Pharmacist 01/19/2024

## 2024-01-19 NOTE — Inpatient Diabetes Management (Signed)
 Inpatient Diabetes Program Recommendations  AACE/ADA: New Consensus Statement on Inpatient Glycemic Control   Target Ranges:  Prepandial:   less than 140 mg/dL      Peak postprandial:   less than 180 mg/dL (1-2 hours)      Critically ill patients:  140 - 180 mg/dL    Latest Reference Range & Units 01/19/24 04:41 01/19/24 06:05 01/19/24 07:22 01/19/24 11:17  Glucose-Capillary 70 - 99 mg/dL 72 89 77 66 (L)    Latest Reference Range & Units 01/18/24 08:19 01/18/24 11:45 01/18/24 16:03 01/18/24 21:08  Glucose-Capillary 70 - 99 mg/dL 308 (H) 657 (H) 94 846 (H)   Review of Glycemic Control  Diabetes history: DM2 Outpatient Diabetes medications: Toujeo  10 units daily, Metformin  XR 1000 mg QAM Current orders for Inpatient glycemic control: Semglee  10 units daily, Novolog  0-6 units TID with meals, Novolog  0-5 units QHS   Inpatient Diabetes Program Recommendations:    Insulin : CBG 77 mg/dl at 9:62 am and 66 mg/dl at 95:28 am today.  Semglee  not given today.  May want to consider decreasing Semglee  to 5 units daily.  Thanks, Beacher Limerick, RN, MSN, CDCES Diabetes Coordinator Inpatient Diabetes Program [phone] (Team Pager from 8am to 5pm)

## 2024-01-19 NOTE — Consult Note (Signed)
 PHARMACY - ANTICOAGULATION CONSULT NOTE  Pharmacy Consult for heparin   Indication: lower extremity ischemia   Allergies  Allergen Reactions   Iodinated Contrast Media Itching   Trelegy Ellipta  [Fluticasone -Umeclidin-Vilant]     Had breathing issues    Patient Measurements: Height: 4\' 11"  (149.9 cm) Weight: 54 kg (119 lb 0.8 oz) IBW/kg (Calculated) : 43.2 HEPARIN  DW (KG): 54  Vital Signs: Temp: 98.3 F (36.8 C) (06/04 1608) Temp Source: Oral (06/04 1608) BP: 103/54 (06/04 1608) Pulse Rate: 96 (06/04 1608)  Labs: Recent Labs     0000 01/17/24 0836 01/17/24 1208 01/17/24 1805 01/18/24 0355 01/18/24 1150 01/19/24 0207 01/19/24 0847 01/19/24 1814  HGB   < >  --  10.6*  --  10.6*  --  11.0*  --   --   HCT  --   --  34.1*  --  34.9*  --  35.4*  --   --   PLT  --   --  239  --  245  --  254  --   --   LABPROT  --   --  14.0  --   --   --   --   --   --   INR  --   --  1.1  --   --   --   --   --   --   HEPARINUNFRC  --   --   --    < > 0.14*   < > 0.27* 0.30 0.38  CREATININE  --  0.73 0.76  --   --   --   --   --   --    < > = values in this interval not displayed.    Estimated Creatinine Clearance: 50.5 mL/min (by C-G formula based on SCr of 0.76 mg/dL).   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/2016   Cancer (HCC) 2012   Right Lung CA   COPD (chronic obstructive pulmonary disease) (HCC)    Depression    Diabetes mellitus without complication (HCC)    Patient takes Janumet   Essential hypertension 05/26/2016   Heart failure (HCC) 2022   Hydropneumothorax 05/03/2020   Hypercholesteremia    Oxygen  dependent    2L at nite    PAD (peripheral artery disease) (HCC) 06/22/2016   Peripheral vascular disease (HCC)    Personal history of radiation therapy    Shortness of breath dyspnea    with exertion    Sialolithiasis     Sleep apnea    Wears dentures    full upper and lower    Medications:  Medications Prior to Admission  Medication Sig Dispense Refill Last Dose/Taking   albuterol  (VENTOLIN  HFA) 108 (90 Base) MCG/ACT inhaler INHALE 2 PUFFS BY MOUTH EVERY 6 HOURS AS NEEDED FOR WHEEZING OR SHORTNESS OF BREATH 3 each 5 01/17/2024 Morning   ALPRAZolam  (XANAX ) 0.5 MG tablet Take 1 tablet (0.5 mg total) by mouth 2 (two) times daily as needed for anxiety. 60 tablet 2 01/17/2024 Morning   aspirin  EC 81 MG tablet Take 1 tablet (81 mg total) by mouth daily. Swallow whole. 150 tablet 2 01/17/2024 Morning   atorvastatin  (LIPITOR) 10 MG tablet Take 1 tablet (10 mg total) by mouth daily. 30 tablet 11 01/17/2024 Morning   calcium  carbonate (OSCAL) 1500 (600 Ca) MG TABS tablet  Take 600 mg of elemental calcium  by mouth 2 (two) times daily with a meal.   01/17/2024 Morning   clopidogrel  (PLAVIX ) 75 MG tablet Take 1 tablet (75 mg total) by mouth daily. 90 tablet 3 01/17/2024 Morning   cyclobenzaprine  (FLEXERIL ) 10 MG tablet Take 1 tablet (10 mg total) by mouth at bedtime. Take one tab po qhs for back spasm prn only 30 tablet 3 Past Month   escitalopram  (LEXAPRO ) 10 MG tablet Take 1 tablet (10 mg total) by mouth daily. 90 tablet 1 01/17/2024 Morning   ferrous sulfate  325 (65 FE) MG tablet Take 325 mg by mouth 2 (two) times daily with a meal.   01/17/2024 Morning   furosemide  (LASIX ) 20 MG tablet Take 1 tablet (20 mg total) by mouth daily as needed. 30 tablet 5 01/16/2024   gabapentin  (NEURONTIN ) 300 MG capsule TAKE 1 CAPSULE BY MOUTH THREE TIMES DAILY 90 capsule 0 01/17/2024 Morning   insulin  glargine, 1 Unit Dial , (TOUJEO ) 300 UNIT/ML Solostar Pen Inject 10 Units into the skin daily. 1.5 mL 11 01/17/2024 Morning   ipratropium-albuterol  (DUONEB) 0.5-2.5 (3) MG/3ML SOLN USE 1 AMPULE IN NEBULIZER EVERY 4 HOURS AS NEEDED 360 mL 0 01/17/2024 Morning   lisinopril -hydrochlorothiazide  (ZESTORETIC ) 20-25 MG tablet Take 1 tablet by mouth daily. 90 tablet 3  01/17/2024 Morning   metoprolol  succinate (TOPROL -XL) 25 MG 24 hr tablet Take 1 tablet (25 mg total) by mouth daily. 90 tablet 3 01/17/2024 Morning   mometasone -formoterol  (DULERA ) 200-5 MCG/ACT AERO Inhale 2 puffs by mouth twice daily 13 g 12 01/17/2024 Morning   potassium chloride  (KLOR-CON ) 10 MEQ tablet TAKE 1 TABLET BY MOUTH EVERY OTHER DAY 45 tablet 0 01/16/2024   SPIRIVA  HANDIHALER 18 MCG inhalation capsule INHALE THE CONTENTS OF 1 CAPSULES BY MOUTH ONCE DAILY - DO NOT SWALLOW CAPSULES 90 capsule 3 01/17/2024 Morning   vitamin B-12 (CYANOCOBALAMIN ) 100 MCG tablet Take 100 mcg by mouth daily.   01/17/2024 Morning   VITAMIN D , CHOLECALCIFEROL , PO Take 1 tablet by mouth in the morning.   01/17/2024 Morning   ACCU-CHEK GUIDE TEST test strip USE TO TEST BLOOD SUGAR 5 TIMES DAILY 450 each 0    Accu-Chek Softclix Lancets lancets Use 1 lancet 3 times daily to check glucose for diabetes 300 each 1    Blood Glucose Monitoring Suppl (ACCU-CHEK GUIDE) w/Device KIT Use as directed Dx e11.65 1 kit 0    Continuous Blood Gluc Receiver (DEXCOM G7 RECEIVER) DEVI Use one as directed for uncontrolled dm 1 each 0    Continuous Glucose Sensor (DEXCOM G7 SENSOR) MISC USE AS DIRECTED. CHANGE EVERY 10 DAYS. 3 each 0    Ensifentrine  (OHTUVAYRE ) 3 MG/2.5ML SUSP Inhale 3 mg into the lungs in the morning and at bedtime. (Patient not taking: Reported on 01/17/2024) 150 mL 11 Not Taking   famotidine  (PEPCID ) 20 MG tablet Take 1 tablet (20 mg total) by mouth daily for 14 days. (Patient taking differently: Take 20 mg by mouth daily as needed for heartburn.) 14 tablet 0    Insulin  Pen Needle (RELION PEN NEEDLES) 31G X 6 MM MISC USE 1 PEN NEEDLE WITH INSULIN  PEN ONCE DAILY FOR DIABETES 50 each 3    methylPREDNISolone  (MEDROL  DOSEPAK) 4 MG TBPK tablet       OXYGEN  Inhale 4 L into the lungs. PT USES ADAPT HEALTH FOR OXYGEN       Scheduled:   aspirin  EC  81 mg Oral Daily   atorvastatin   10 mg Oral Daily  calcium  carbonate  500 mg of  elemental calcium  Oral BID WC   cholecalciferol   1,000 Units Oral q AM   escitalopram   10 mg Oral Daily   ferrous sulfate   325 mg Oral BID WC   fluticasone  furoate-vilanterol  1 puff Inhalation Daily   gabapentin   300 mg Oral TID   insulin  aspart  0-5 Units Subcutaneous QHS   insulin  aspart  0-6 Units Subcutaneous TID WC   insulin  glargine-yfgn  10 Units Subcutaneous Daily   metoprolol  succinate  25 mg Oral Daily   pantoprazole  40 mg Oral Daily   potassium chloride   10 mEq Oral QODAY   potassium chloride   20-40 mEq Oral Once   umeclidinium bromide   1 puff Inhalation Daily   vitamin B-12  100 mcg Oral Daily   Infusions:   heparin  800 Units/hr (01/19/24 1135)   PRN: acetaminophen  **OR** acetaminophen , ALPRAZolam , alum & mag hydroxide-simeth, cyclobenzaprine , diphenhydrAMINE , famotidine , guaiFENesin -dextromethorphan, hydrALAZINE , ipratropium-albuterol , labetalol , magnesium  hydroxide, metoprolol  tartrate, morphine  injection, ondansetron , oxyCODONE -acetaminophen , phenol, sorbitol Anti-infectives (From admission, onward)    Start     Dose/Rate Route Frequency Ordered Stop   01/17/24 0829  ceFAZolin  (ANCEF ) IVPB 2g/100 mL premix        2 g 200 mL/hr over 30 Minutes Intravenous 30 min pre-op 01/17/24 7829 01/17/24 2113       Assessment: 68 yr old female underwent LE angiography 6/2. 70% stenosis of the left common femoral artery with disease extending down into the proximal profunda femoris artery. No DOAC PTA. PMH: HTN, DM, IDA, PAD. No CBC will order a stat lab.   6/2 1805    HL 0.33, therapeutic x 1  650 units/hr 6/2 2145    Hepatin gtt held for 1 hr d/t bleeding at infusion site 6/3 0355    HL 0.14, SUBtherapeutic @ 550 units/hr 6/3 1150    HL 0.15, SUBtherapeutic @ 600 units/hr 6/3 1822    HL 0.23, SUBtherapeutic @ 650 units/hr 6/4 0207    HL 0.27, SUBtherapeutic @ 700 units/hr 6/4 0847    HL 0.30, barely Therapeutic ,  inc to 750 to 800 units/hr 6/4 1814    HL 0.38  Goal of  Therapy:  Heparin  level 0.3-0.7 units/ml Monitor platelets by anticoagulation protocol: Yes   Plan:  Heparin  level is therapeutic. Will continue heparin  infusion at 800 units/hr. Recheck heparin  level and CBC with AM labs.   Harlie Lieu, PharmD Clinical Pharmacist 01/19/2024

## 2024-01-19 NOTE — Plan of Care (Signed)

## 2024-01-19 NOTE — Progress Notes (Signed)
  Vein and Vascular Surgery  Daily Progress Note   Subjective  -   Patient resting comfortably.  Mild rest pain in the left foot but better on heparin .  Blood pressure has still been running on the low side.  Fluid bolus was given earlier.  Objective Vitals:   01/19/24 0355 01/19/24 0409 01/19/24 0725 01/19/24 1608  BP:  120/64 (!) 89/70 (!) 103/54  Pulse: 82 99 95 96  Resp: 20  17 17   Temp: 98 F (36.7 C)  98.7 F (37.1 C) 98.3 F (36.8 C)  TempSrc: Oral   Oral  SpO2: 98%  95% 98%  Weight:      Height:        Intake/Output Summary (Last 24 hours) at 01/19/2024 1705 Last data filed at 01/19/2024 1429 Gross per 24 hour  Intake 0 ml  Output 300 ml  Net -300 ml    PULM  CTAB CV  RRR VASC  left foot is warm relatively warm.  No palpable pulses.  Laboratory CBC    Component Value Date/Time   WBC 8.4 01/19/2024 0207   HGB 11.0 (L) 01/19/2024 0207   HGB 12.7 10/08/2023 1004   HGB 11.6 10/04/2023 1047   HCT 35.4 (L) 01/19/2024 0207   HCT 37.6 10/04/2023 1047   PLT 254 01/19/2024 0207   PLT 264 10/08/2023 1004   PLT 236 10/04/2023 1047    BMET    Component Value Date/Time   NA 138 01/17/2024 1208   NA 144 10/04/2023 1047   NA 141 04/17/2014 1055   K 3.9 01/17/2024 1208   K 3.9 04/17/2014 1055   CL 99 01/17/2024 1208   CL 102 04/17/2014 1055   CO2 28 01/17/2024 1208   CO2 30 04/17/2014 1055   GLUCOSE 272 (H) 01/17/2024 1208   GLUCOSE 201 (H) 04/17/2014 1055   BUN 20 01/17/2024 1208   BUN 12 10/04/2023 1047   BUN 10 04/17/2014 1055   CREATININE 0.76 01/17/2024 1208   CREATININE 0.84 07/06/2023 1016   CREATININE 0.90 04/17/2014 1055   CALCIUM  9.3 01/17/2024 1208   CALCIUM  9.1 04/17/2014 1055   GFRNONAA >60 01/17/2024 1208   GFRNONAA >60 07/06/2023 1016   GFRNONAA >60 04/17/2014 1055   GFRAA >60 05/05/2020 0558   GFRAA >60 04/17/2014 1055    Assessment/Planning: POD #2 s/p aortagram, LLE angiogram  Surgery planned for today was postponed  secondary to low patient blood pressure as well as OR delays. Patient will remain on heparin  drip. Gentle fluid bolus and hold antihypertensives to try to get blood pressure up Rescheduled her surgery for Friday, 01/21/2024 Planning left femoral endarterectomy, left iliac stent, left SFA/popliteal intervention   Mikki Alexander  01/19/2024, 5:05 PM

## 2024-01-19 NOTE — Consult Note (Signed)
 PHARMACY - ANTICOAGULATION CONSULT NOTE  Pharmacy Consult for heparin   Indication: lower extremity ischemia   Allergies  Allergen Reactions   Iodinated Contrast Media Itching   Trelegy Ellipta  [Fluticasone -Umeclidin-Vilant]     Had breathing issues    Patient Measurements: Height: 4\' 11"  (149.9 cm) Weight: 54 kg (119 lb 0.8 oz) IBW/kg (Calculated) : 43.2 HEPARIN  DW (KG): 54  Vital Signs: Temp: 98.7 F (37.1 C) (06/04 0725) Temp Source: Oral (06/04 0355) BP: 89/70 (06/04 0725) Pulse Rate: 95 (06/04 0725)  Labs: Recent Labs     0000 01/17/24 0836 01/17/24 1208 01/17/24 1805 01/18/24 0355 01/18/24 1150 01/18/24 1822 01/19/24 0207 01/19/24 0847  HGB   < >  --  10.6*  --  10.6*  --   --  11.0*  --   HCT  --   --  34.1*  --  34.9*  --   --  35.4*  --   PLT  --   --  239  --  245  --   --  254  --   LABPROT  --   --  14.0  --   --   --   --   --   --   INR  --   --  1.1  --   --   --   --   --   --   HEPARINUNFRC  --   --   --    < > 0.14*   < > 0.23* 0.27* 0.30  CREATININE  --  0.73 0.76  --   --   --   --   --   --    < > = values in this interval not displayed.    Estimated Creatinine Clearance: 50.5 mL/min (by C-G formula based on SCr of 0.76 mg/dL).   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/2016   Cancer (HCC) 2012   Right Lung CA   COPD (chronic obstructive pulmonary disease) (HCC)    Depression    Diabetes mellitus without complication (HCC)    Patient takes Janumet   Essential hypertension 05/26/2016   Heart failure (HCC) 2022   Hydropneumothorax 05/03/2020   Hypercholesteremia    Oxygen  dependent    2L at nite    PAD (peripheral artery disease) (HCC) 06/22/2016   Peripheral vascular disease (HCC)    Personal history of radiation therapy    Shortness of breath dyspnea    with exertion    Sialolithiasis     Sleep apnea    Wears dentures    full upper and lower    Medications:  Medications Prior to Admission  Medication Sig Dispense Refill Last Dose/Taking   albuterol  (VENTOLIN  HFA) 108 (90 Base) MCG/ACT inhaler INHALE 2 PUFFS BY MOUTH EVERY 6 HOURS AS NEEDED FOR WHEEZING OR SHORTNESS OF BREATH 3 each 5 01/17/2024 Morning   ALPRAZolam  (XANAX ) 0.5 MG tablet Take 1 tablet (0.5 mg total) by mouth 2 (two) times daily as needed for anxiety. 60 tablet 2 01/17/2024 Morning   aspirin  EC 81 MG tablet Take 1 tablet (81 mg total) by mouth daily. Swallow whole. 150 tablet 2 01/17/2024 Morning   atorvastatin  (LIPITOR) 10 MG tablet Take 1 tablet (10 mg total) by mouth daily. 30 tablet 11 01/17/2024 Morning   calcium  carbonate (OSCAL) 1500 (600 Ca) MG TABS tablet  Take 600 mg of elemental calcium  by mouth 2 (two) times daily with a meal.   01/17/2024 Morning   clopidogrel  (PLAVIX ) 75 MG tablet Take 1 tablet (75 mg total) by mouth daily. 90 tablet 3 01/17/2024 Morning   cyclobenzaprine  (FLEXERIL ) 10 MG tablet Take 1 tablet (10 mg total) by mouth at bedtime. Take one tab po qhs for back spasm prn only 30 tablet 3 Past Month   escitalopram  (LEXAPRO ) 10 MG tablet Take 1 tablet (10 mg total) by mouth daily. 90 tablet 1 01/17/2024 Morning   ferrous sulfate  325 (65 FE) MG tablet Take 325 mg by mouth 2 (two) times daily with a meal.   01/17/2024 Morning   furosemide  (LASIX ) 20 MG tablet Take 1 tablet (20 mg total) by mouth daily as needed. 30 tablet 5 01/16/2024   gabapentin  (NEURONTIN ) 300 MG capsule TAKE 1 CAPSULE BY MOUTH THREE TIMES DAILY 90 capsule 0 01/17/2024 Morning   insulin  glargine, 1 Unit Dial , (TOUJEO ) 300 UNIT/ML Solostar Pen Inject 10 Units into the skin daily. 1.5 mL 11 01/17/2024 Morning   ipratropium-albuterol  (DUONEB) 0.5-2.5 (3) MG/3ML SOLN USE 1 AMPULE IN NEBULIZER EVERY 4 HOURS AS NEEDED 360 mL 0 01/17/2024 Morning   lisinopril -hydrochlorothiazide  (ZESTORETIC ) 20-25 MG tablet Take 1 tablet by mouth daily. 90 tablet 3  01/17/2024 Morning   metoprolol  succinate (TOPROL -XL) 25 MG 24 hr tablet Take 1 tablet (25 mg total) by mouth daily. 90 tablet 3 01/17/2024 Morning   mometasone -formoterol  (DULERA ) 200-5 MCG/ACT AERO Inhale 2 puffs by mouth twice daily 13 g 12 01/17/2024 Morning   potassium chloride  (KLOR-CON ) 10 MEQ tablet TAKE 1 TABLET BY MOUTH EVERY OTHER DAY 45 tablet 0 01/16/2024   SPIRIVA  HANDIHALER 18 MCG inhalation capsule INHALE THE CONTENTS OF 1 CAPSULES BY MOUTH ONCE DAILY - DO NOT SWALLOW CAPSULES 90 capsule 3 01/17/2024 Morning   vitamin B-12 (CYANOCOBALAMIN ) 100 MCG tablet Take 100 mcg by mouth daily.   01/17/2024 Morning   VITAMIN D , CHOLECALCIFEROL , PO Take 1 tablet by mouth in the morning.   01/17/2024 Morning   ACCU-CHEK GUIDE TEST test strip USE TO TEST BLOOD SUGAR 5 TIMES DAILY 450 each 0    Accu-Chek Softclix Lancets lancets Use 1 lancet 3 times daily to check glucose for diabetes 300 each 1    Blood Glucose Monitoring Suppl (ACCU-CHEK GUIDE) w/Device KIT Use as directed Dx e11.65 1 kit 0    Continuous Blood Gluc Receiver (DEXCOM G7 RECEIVER) DEVI Use one as directed for uncontrolled dm 1 each 0    Continuous Glucose Sensor (DEXCOM G7 SENSOR) MISC USE AS DIRECTED. CHANGE EVERY 10 DAYS. 3 each 0    Ensifentrine  (OHTUVAYRE ) 3 MG/2.5ML SUSP Inhale 3 mg into the lungs in the morning and at bedtime. (Patient not taking: Reported on 01/17/2024) 150 mL 11 Not Taking   famotidine  (PEPCID ) 20 MG tablet Take 1 tablet (20 mg total) by mouth daily for 14 days. (Patient taking differently: Take 20 mg by mouth daily as needed for heartburn.) 14 tablet 0    Insulin  Pen Needle (RELION PEN NEEDLES) 31G X 6 MM MISC USE 1 PEN NEEDLE WITH INSULIN  PEN ONCE DAILY FOR DIABETES 50 each 3    methylPREDNISolone  (MEDROL  DOSEPAK) 4 MG TBPK tablet       OXYGEN  Inhale 4 L into the lungs. PT USES ADAPT HEALTH FOR OXYGEN       Scheduled:   aspirin  EC  81 mg Oral Daily   atorvastatin   10 mg Oral Daily  calcium  carbonate  500 mg of  elemental calcium  Oral BID WC   cholecalciferol   1,000 Units Oral q AM   escitalopram   10 mg Oral Daily   ferrous sulfate   325 mg Oral BID WC   fluticasone  furoate-vilanterol  1 puff Inhalation Daily   gabapentin   300 mg Oral TID   insulin  aspart  0-5 Units Subcutaneous QHS   insulin  aspart  0-6 Units Subcutaneous TID WC   insulin  glargine-yfgn  10 Units Subcutaneous Daily   metoprolol  succinate  25 mg Oral Daily   pantoprazole  40 mg Oral Daily   potassium chloride   10 mEq Oral QODAY   potassium chloride   20-40 mEq Oral Once   umeclidinium bromide   1 puff Inhalation Daily   vitamin B-12  100 mcg Oral Daily   Infusions:   heparin  750 Units/hr (01/19/24 0308)   PRN: acetaminophen  **OR** acetaminophen , ALPRAZolam , alum & mag hydroxide-simeth, cyclobenzaprine , diphenhydrAMINE , famotidine , guaiFENesin -dextromethorphan, hydrALAZINE , ipratropium-albuterol , labetalol , magnesium  hydroxide, metoprolol  tartrate, morphine  injection, ondansetron , oxyCODONE -acetaminophen , phenol, sorbitol Anti-infectives (From admission, onward)    Start     Dose/Rate Route Frequency Ordered Stop   01/17/24 0829  ceFAZolin  (ANCEF ) IVPB 2g/100 mL premix        2 g 200 mL/hr over 30 Minutes Intravenous 30 min pre-op 01/17/24 0829 01/17/24 2113       Assessment: 68 yr old female underwent LE angiography 6/2. 70% stenosis of the left common femoral artery with disease extending down into the proximal profunda femoris artery. No DOAC PTA. PMH: HTN, DM, IDA, PAD. No CBC will order a stat lab.   6/2 1805    HL 0.33, therapeutic x 1  650 units/hr 6/2 2145    Hepatin gtt held for 1 hr d/t bleeding at infusion site 6/3 0355    HL 0.14, SUBtherapeutic @ 550 units/hr 6/3 1150    HL 0.15, SUBtherapeutic @ 600 units/hr 6/3 1822    HL 0.23, SUBtherapeutic @ 650 units/hr 6/4 0207    HL 0.27, SUBtherapeutic @ 700 units/hr 6/4 0847    HL 0.30, barely Therapeutic ,  inc to 750 to 800 units/hr  Goal of Therapy:  Heparin   level 0.3-0.7 units/ml Monitor platelets by anticoagulation protocol: Yes   Plan:  6/4 0847  HL 0.30, barely Therapeutic  -No Bolus due to recent bleeding - will increase heparin  drip slightly to 800 units/hr  - will recheck HL 6 hrs after rate change  - CBC daily  Thomasine Flick PharmD Clinical Pharmacist 01/19/2024

## 2024-01-19 NOTE — Plan of Care (Signed)

## 2024-01-19 NOTE — Plan of Care (Signed)

## 2024-01-19 NOTE — Care Management Important Message (Signed)
 Important Message  Patient Details  Name: [name] MRN: [id number] Date of Birth: 1956/03/02   Important Message Given:  Yes - Medicare IM     Anise Kerns 01/19/2024, 12:22 PM

## 2024-01-20 DIAGNOSIS — I70229 Atherosclerosis of native arteries of extremities with rest pain, unspecified extremity: Secondary | ICD-10-CM | POA: Diagnosis not present

## 2024-01-20 DIAGNOSIS — I998 Other disorder of circulatory system: Secondary | ICD-10-CM | POA: Diagnosis not present

## 2024-01-20 LAB — BASIC METABOLIC PANEL WITH GFR
Anion gap: 9 (ref 5–15)
BUN: 22 mg/dL (ref 8–23)
CO2: 33 mmol/L — ABNORMAL HIGH (ref 22–32)
Calcium: 8.9 mg/dL (ref 8.9–10.3)
Chloride: 100 mmol/L (ref 98–111)
Creatinine, Ser: 0.76 mg/dL (ref 0.44–1.00)
GFR, Estimated: >60 mL/min (ref >60)
Glucose, Bld: 108 mg/dL — ABNORMAL HIGH (ref 70–99)
Potassium: 4.1 mmol/L (ref 3.5–5.1)
Sodium: 142 mmol/L (ref 135–145)

## 2024-01-20 LAB — CBC
HCT: 33.6 % — ABNORMAL LOW (ref 36.0–46.0)
Hemoglobin: 10.2 g/dL — ABNORMAL LOW (ref 12.0–15.0)
MCH: 28.7 pg (ref 26.0–34.0)
MCHC: 30.4 g/dL (ref 30.0–36.0)
MCV: 94.4 fL (ref 80.0–100.0)
Platelets: 213 10*3/uL (ref 150–400)
RBC: 3.56 MIL/uL — ABNORMAL LOW (ref 3.87–5.11)
RDW: 13.9 % (ref 11.5–15.5)
WBC: 6.4 10*3/uL (ref 4.0–10.5)
nRBC: 0 % (ref 0.0–0.2)

## 2024-01-20 LAB — GLUCOSE, CAPILLARY
Glucose-Capillary: 103 mg/dL — ABNORMAL HIGH (ref 70–99)
Glucose-Capillary: 156 mg/dL — ABNORMAL HIGH (ref 70–99)
Glucose-Capillary: 165 mg/dL — ABNORMAL HIGH (ref 70–99)
Glucose-Capillary: 175 mg/dL — ABNORMAL HIGH (ref 70–99)

## 2024-01-20 LAB — HEPARIN LEVEL (UNFRACTIONATED): Heparin Unfractionated: 0.36 [IU]/mL (ref 0.30–0.70)

## 2024-01-20 MED ORDER — CHLORHEXIDINE GLUCONATE 4 % EX SOLN
60.0000 mL | Freq: Once | CUTANEOUS | Status: AC
  Administered 2024-01-20: 4 via TOPICAL

## 2024-01-20 MED ORDER — CEFAZOLIN SODIUM-DEXTROSE 2-4 GM/100ML-% IV SOLN
2.0000 g | INTRAVENOUS | Status: AC
  Administered 2024-01-21: 2 g via INTRAVENOUS

## 2024-01-20 MED ORDER — CHLORHEXIDINE GLUCONATE 4 % EX SOLN
60.0000 mL | Freq: Once | CUTANEOUS | Status: AC
  Administered 2024-01-21: 4 via TOPICAL

## 2024-01-20 MED ORDER — SODIUM CHLORIDE 0.9 % IV SOLN: INTRAVENOUS | Status: DC

## 2024-01-20 NOTE — Consult Note (Signed)
 PHARMACY - ANTICOAGULATION CONSULT NOTE  Pharmacy Consult for heparin   Indication: lower extremity ischemia   Allergies  Allergen Reactions   Iodinated Contrast Media Itching   Trelegy Ellipta  [Fluticasone -Umeclidin-Vilant]     Had breathing issues    Patient Measurements: Height: 4\' 11"  (149.9 cm) Weight: 54 kg (119 lb 0.8 oz) IBW/kg (Calculated) : 43.2 HEPARIN  DW (KG): 54  Vital Signs: Temp: 98.3 F (36.8 C) (06/05 0305) Temp Source: Oral (06/05 0305) BP: 125/74 (06/05 0305) Pulse Rate: 95 (06/05 0305)  Labs: Recent Labs     0000 01/17/24 0836 01/17/24 1208 01/17/24 1805 01/18/24 0355 01/18/24 1150 01/19/24 0207 01/19/24 0847 01/19/24 1814 01/20/24 0430  HGB   < >  --  10.6*  --  10.6*  --  11.0*  --   --  10.2*  HCT   < >  --  34.1*  --  34.9*  --  35.4*  --   --  33.6*  PLT   < >  --  239  --  245  --  254  --   --  213  LABPROT  --   --  14.0  --   --   --   --   --   --   --   INR  --   --  1.1  --   --   --   --   --   --   --   HEPARINUNFRC  --   --   --    < > 0.14*   < > 0.27* 0.30 0.38 0.36  CREATININE  --  0.73 0.76  --   --   --   --   --   --  0.76   < > = values in this interval not displayed.    Estimated Creatinine Clearance: 50.5 mL/min (by C-G formula based on SCr of 0.76 mg/dL).   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/2016   Cancer (HCC) 2012   Right Lung CA   COPD (chronic obstructive pulmonary disease) (HCC)    Depression    Diabetes mellitus without complication (HCC)    Patient takes Janumet   Essential hypertension 05/26/2016   Heart failure (HCC) 2022   Hydropneumothorax 05/03/2020   Hypercholesteremia    Oxygen  dependent    2L at nite    PAD (peripheral artery disease) (HCC) 06/22/2016   Peripheral vascular disease (HCC)    Personal history of radiation therapy    Shortness of  breath dyspnea    with exertion    Sialolithiasis    Sleep apnea    Wears dentures    full upper and lower    Medications:  Medications Prior to Admission  Medication Sig Dispense Refill Last Dose/Taking   albuterol  (VENTOLIN  HFA) 108 (90 Base) MCG/ACT inhaler INHALE 2 PUFFS BY MOUTH EVERY 6 HOURS AS NEEDED FOR WHEEZING OR SHORTNESS OF BREATH 3 each 5 01/17/2024 Morning   ALPRAZolam  (XANAX ) 0.5 MG tablet Take 1 tablet (0.5 mg total) by mouth 2 (two) times daily as needed for anxiety. 60 tablet 2 01/17/2024 Morning   aspirin  EC 81 MG tablet Take 1 tablet (81 mg total) by mouth daily. Swallow whole. 150 tablet 2 01/17/2024 Morning   atorvastatin  (LIPITOR) 10 MG tablet Take 1 tablet (10 mg total) by mouth daily. 30  tablet 11 01/17/2024 Morning   calcium  carbonate (OSCAL) 1500 (600 Ca) MG TABS tablet Take 600 mg of elemental calcium  by mouth 2 (two) times daily with a meal.   01/17/2024 Morning   clopidogrel  (PLAVIX ) 75 MG tablet Take 1 tablet (75 mg total) by mouth daily. 90 tablet 3 01/17/2024 Morning   cyclobenzaprine  (FLEXERIL ) 10 MG tablet Take 1 tablet (10 mg total) by mouth at bedtime. Take one tab po qhs for back spasm prn only 30 tablet 3 Past Month   escitalopram  (LEXAPRO ) 10 MG tablet Take 1 tablet (10 mg total) by mouth daily. 90 tablet 1 01/17/2024 Morning   ferrous sulfate  325 (65 FE) MG tablet Take 325 mg by mouth 2 (two) times daily with a meal.   01/17/2024 Morning   furosemide  (LASIX ) 20 MG tablet Take 1 tablet (20 mg total) by mouth daily as needed. 30 tablet 5 01/16/2024   gabapentin  (NEURONTIN ) 300 MG capsule TAKE 1 CAPSULE BY MOUTH THREE TIMES DAILY 90 capsule 0 01/17/2024 Morning   insulin  glargine, 1 Unit Dial , (TOUJEO ) 300 UNIT/ML Solostar Pen Inject 10 Units into the skin daily. 1.5 mL 11 01/17/2024 Morning   ipratropium-albuterol  (DUONEB) 0.5-2.5 (3) MG/3ML SOLN USE 1 AMPULE IN NEBULIZER EVERY 4 HOURS AS NEEDED 360 mL 0 01/17/2024 Morning   lisinopril -hydrochlorothiazide  (ZESTORETIC ) 20-25 MG  tablet Take 1 tablet by mouth daily. 90 tablet 3 01/17/2024 Morning   metoprolol  succinate (TOPROL -XL) 25 MG 24 hr tablet Take 1 tablet (25 mg total) by mouth daily. 90 tablet 3 01/17/2024 Morning   mometasone -formoterol  (DULERA ) 200-5 MCG/ACT AERO Inhale 2 puffs by mouth twice daily 13 g 12 01/17/2024 Morning   potassium chloride  (KLOR-CON ) 10 MEQ tablet TAKE 1 TABLET BY MOUTH EVERY OTHER DAY 45 tablet 0 01/16/2024   SPIRIVA  HANDIHALER 18 MCG inhalation capsule INHALE THE CONTENTS OF 1 CAPSULES BY MOUTH ONCE DAILY - DO NOT SWALLOW CAPSULES 90 capsule 3 01/17/2024 Morning   vitamin B-12 (CYANOCOBALAMIN ) 100 MCG tablet Take 100 mcg by mouth daily.   01/17/2024 Morning   VITAMIN D , CHOLECALCIFEROL , PO Take 1 tablet by mouth in the morning.   01/17/2024 Morning   ACCU-CHEK GUIDE TEST test strip USE TO TEST BLOOD SUGAR 5 TIMES DAILY 450 each 0    Accu-Chek Softclix Lancets lancets Use 1 lancet 3 times daily to check glucose for diabetes 300 each 1    Blood Glucose Monitoring Suppl (ACCU-CHEK GUIDE) w/Device KIT Use as directed Dx e11.65 1 kit 0    Continuous Blood Gluc Receiver (DEXCOM G7 RECEIVER) DEVI Use one as directed for uncontrolled dm 1 each 0    Continuous Glucose Sensor (DEXCOM G7 SENSOR) MISC USE AS DIRECTED. CHANGE EVERY 10 DAYS. 3 each 0    Ensifentrine  (OHTUVAYRE ) 3 MG/2.5ML SUSP Inhale 3 mg into the lungs in the morning and at bedtime. (Patient not taking: Reported on 01/17/2024) 150 mL 11 Not Taking   famotidine  (PEPCID ) 20 MG tablet Take 1 tablet (20 mg total) by mouth daily for 14 days. (Patient taking differently: Take 20 mg by mouth daily as needed for heartburn.) 14 tablet 0    Insulin  Pen Needle (RELION PEN NEEDLES) 31G X 6 MM MISC USE 1 PEN NEEDLE WITH INSULIN  PEN ONCE DAILY FOR DIABETES 50 each 3    methylPREDNISolone  (MEDROL  DOSEPAK) 4 MG TBPK tablet       OXYGEN  Inhale 4 L into the lungs. PT USES ADAPT HEALTH FOR OXYGEN       Scheduled:   aspirin  EC  81 mg Oral Daily   atorvastatin   10 mg  Oral Daily   calcium  carbonate  500 mg of elemental calcium  Oral BID WC   cholecalciferol   1,000 Units Oral q AM   escitalopram   10 mg Oral Daily   ferrous sulfate   325 mg Oral BID WC   fluticasone  furoate-vilanterol  1 puff Inhalation Daily   gabapentin   300 mg Oral TID   insulin  aspart  0-5 Units Subcutaneous QHS   insulin  aspart  0-6 Units Subcutaneous TID WC   insulin  glargine-yfgn  10 Units Subcutaneous Daily   metoprolol  succinate  25 mg Oral Daily   pantoprazole  40 mg Oral Daily   potassium chloride   10 mEq Oral QODAY   potassium chloride   20-40 mEq Oral Once   umeclidinium bromide   1 puff Inhalation Daily   vitamin B-12  100 mcg Oral Daily   Infusions:   heparin  800 Units/hr (01/19/24 1135)   PRN: acetaminophen  **OR** acetaminophen , ALPRAZolam , alum & mag hydroxide-simeth, cyclobenzaprine , diphenhydrAMINE , famotidine , guaiFENesin -dextromethorphan, hydrALAZINE , ipratropium-albuterol , labetalol , magnesium  hydroxide, metoprolol  tartrate, morphine  injection, ondansetron , oxyCODONE -acetaminophen , phenol, sorbitol Anti-infectives (From admission, onward)    Start     Dose/Rate Route Frequency Ordered Stop   01/17/24 0829  ceFAZolin  (ANCEF ) IVPB 2g/100 mL premix        2 g 200 mL/hr over 30 Minutes Intravenous 30 min pre-op 01/17/24 0829 01/17/24 2113       Assessment: 68 yr old female underwent LE angiography 6/2. 70% stenosis of the left common femoral artery with disease extending down into the proximal profunda femoris artery. No DOAC PTA. PMH: HTN, DM, IDA, PAD. No CBC will order a stat lab.   6/2 1805    HL 0.33, therapeutic x 1  650 units/hr 6/2 2145    Hepatin gtt held for 1 hr d/t bleeding at infusion site 6/3 0355    HL 0.14, SUBtherapeutic @ 550 units/hr 6/3 1150    HL 0.15, SUBtherapeutic @ 600 units/hr 6/3 1822    HL 0.23, SUBtherapeutic @ 650 units/hr 6/4 0207    HL 0.27, SUBtherapeutic @ 700 units/hr 6/4 0847    HL 0.30, barely Therapeutic ,  inc to 750 to 800  units/hr 6/4 1814    HL 0.38 6/5 0430    HL 0.36, therapeutic X 3   Goal of Therapy:  Heparin  level 0.3-0.7 units/ml Monitor platelets by anticoagulation protocol: Yes   Plan:  6/5:  HL @ 0430 = 0.36, therapeutic X 3 - Will continue pt on current rate and recheck HL on 6/6 with AM labs.  - CBC daily   Veyda Kaufman D Clinical Pharmacist 01/20/2024

## 2024-01-20 NOTE — H&P (View-Only) (Signed)
 Progress Note    01/20/2024 2:53 PM 3 Days Post-Op  Subjective:  Angela Jensen is a 68 yo female now POD #1 from left lower extremity angiogram. Patient had issues with HTN yesterday after the procedure and was treated with labetalol  and Hydralazine . This morning patient blood pressure  was low and she was dizzy. Bolus of LR 500's cc's given. She was also given some benadryl  for itching from the use of oxycodone . Therefore she is sleepy. Daughter is at the bedside this morning. No complaints overnight. Vitals remain stable.    Review of Systems  Constitutional:  Constitutional negative. HENT: HENT negative.  Eyes: Eyes negative.  Respiratory: Respiratory negative.  Cardiovascular: Positive for claudication.  GI: Gastrointestinal negative.  GU: Genitourinary negative. Musculoskeletal: Positive for leg pain.  Skin: Skin negative.  Neurological: Positive for dizziness.  Hematologic: Hematologic/lymphatic negative.  Psychiatric: Psychiatric negative.  All other systems reviewed and are negative     Vitals:   01/20/24 0305 01/20/24 0822  BP: 125/74 110/60  Pulse: 95 100  Resp: 20 19  Temp: 98.3 F (36.8 C) 99 F (37.2 C)  SpO2: 100% 92%   Physical Exam: Cardiac:  RRR, Normal S1,S2. No rubs clicks gallup's or murmurs.  Lungs:  Clear throughout on auscultation, normal labored breathing. No rales rhonchi or wheezing Incisions:  Right groin puncture site dressing clean dry and intact. No hematoma, seroma or infection to note.                     Extremities:  All extremities warm to touch with palpable pulses except the left lower extremity.  Left lower extremity is cool to touch and unable to palpate pulses. Abdomen: Positive bowel sounds throughout, soft, nondistended and nontender. Neurologic: Alert and oriented x 3, answers questions but very sleepy this morning.   CBC    Component Value Date/Time   WBC 6.4 01/20/2024 0430   RBC 3.56 (L) 01/20/2024 0430   HGB 10.2 (L)  01/20/2024 0430   HGB 12.7 10/08/2023 1004   HGB 11.6 10/04/2023 1047   HCT 33.6 (L) 01/20/2024 0430   HCT 37.6 10/04/2023 1047   PLT 213 01/20/2024 0430   PLT 264 10/08/2023 1004   PLT 236 10/04/2023 1047   MCV 94.4 01/20/2024 0430   MCV 88 10/04/2023 1047   MCV 90 04/17/2014 1055   MCH 28.7 01/20/2024 0430   MCHC 30.4 01/20/2024 0430   RDW 13.9 01/20/2024 0430   RDW 12.9 10/04/2023 1047   RDW 13.6 04/17/2014 1055   LYMPHSABS 1.0 10/08/2023 1004   LYMPHSABS 0.9 10/04/2023 1047   LYMPHSABS 1.1 04/17/2014 1055   MONOABS 0.5 10/08/2023 1004   MONOABS 0.3 04/17/2014 1055   EOSABS 0.1 10/08/2023 1004   EOSABS 0.1 10/04/2023 1047   EOSABS 0.2 04/17/2014 1055   BASOSABS 0.0 10/08/2023 1004   BASOSABS 0.0 10/04/2023 1047   BASOSABS 0.1 04/17/2014 1055    BMET    Component Value Date/Time   NA 142 01/20/2024 0430   NA 144 10/04/2023 1047   NA 141 04/17/2014 1055   K 4.1 01/20/2024 0430   K 3.9 04/17/2014 1055   CL 100 01/20/2024 0430   CL 102 04/17/2014 1055   CO2 33 (H) 01/20/2024 0430   CO2 30 04/17/2014 1055   GLUCOSE 108 (H) 01/20/2024 0430   GLUCOSE 201 (H) 04/17/2014 1055   BUN 22 01/20/2024 0430   BUN 12 10/04/2023 1047   BUN 10 04/17/2014 1055   CREATININE  0.76 01/20/2024 0430   CREATININE 0.84 07/06/2023 1016   CREATININE 0.90 04/17/2014 1055   CALCIUM  8.9 01/20/2024 0430   CALCIUM  9.1 04/17/2014 1055   GFRNONAA >60 01/20/2024 0430   GFRNONAA >60 07/06/2023 1016   GFRNONAA >60 04/17/2014 1055   GFRAA >60 05/05/2020 0558   GFRAA >60 04/17/2014 1055    INR    Component Value Date/Time   INR 1.1 01/17/2024 1208     Intake/Output Summary (Last 24 hours) at 01/20/2024 1453 Last data filed at 01/20/2024 1327 Gross per 24 hour  Intake 660 ml  Output --  Net 660 ml     Assessment/Plan:  68 y.o. female is s/p left lower extremity angiogram  3 Days Post-Op    PLAN Vascular surgery plans on taking the patient to the operating room tomorrow Friday 01/21/24  for left lower extremity femoral endarterectomy with iliac stent placement.  I discussed in detail with the patient and daughter at the bedside this morning the procedure, benefits, risk, complications.  They both verbalized her understanding.  Answered all their questions this morning.  Patient was made n.p.o. after midnight tonight for operating room tomorrow.   DVT prophylaxis: ASA 81 mg daily, Lipitor 10 mg daily and heparin  infusion.     Annamaria Barrette [facility] 01/20/2024 2:53 PM

## 2024-01-20 NOTE — Progress Notes (Signed)
 [facility] CLINIC CARDIOLOGY PROGRESS NOTE       [name] MRN: [id number] DOB/AGE: 1956/03/24 68 y.o.  Admit date: 01/17/2024 Referring Physician Dr. Mikki Alexander Primary Physician Laurence Pons, NP Primary Cardiologist Dr. Beau Bound Reason for Consultation POC  HPI: Angela Jensen is a 68 y.o. female  with a past medical history of chronic HFpEF, SSS s/p micra (06/2021), hypertension, hyperlipidemia, aortic atherosclerosis, IDDM, PVD, COPD/asthma (baseline 2L O2), OSA, tobacco use who presented for outpatient procedure on 01/17/2024 underwent lower extremity angiogram on 06/02 and found that patient will need a left femoral endarterectomy with illiac stents and SFA intervention. Cardiology was consulted for preoperative cardiac evaluation.  Interval History: -Patient seen and examined this afternoon and laying comfortably in hospital bed. Patient states she feels well and denies SOB, chest pain or palpitations.  -Wheezing has resolved s/p breathing treatments -Patients BP improved and HR stable.  -Patient remains on 3L with stable SpO2. -Vascular surgery planned for tomorrow (06/06)  Review of systems complete and found to be negative unless listed above    Past Medical History:  Diagnosis Date   Anemia    Arthritis    Asthma    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/2016   Cancer (HCC) 2012   Right Lung CA   COPD (chronic obstructive pulmonary disease) (HCC)    Depression    Diabetes mellitus without complication (HCC)    Patient takes Janumet   Essential hypertension 05/26/2016   Heart failure (HCC) 2022   Hydropneumothorax 05/03/2020   Hypercholesteremia    Oxygen  dependent    2L at nite    PAD (peripheral artery disease) (HCC) 06/22/2016   Peripheral vascular disease (HCC)    Personal history of radiation therapy    Shortness of breath dyspnea    with exertion    Sialolithiasis    Sleep apnea    Wears  dentures    full upper and lower    Past Surgical History:  Procedure Laterality Date   CESAREAN SECTION     x3   COLONOSCOPY WITH PROPOFOL  N/A 06/25/2015   Procedure: COLONOSCOPY WITH PROPOFOL ;  Surgeon: Marnee Sink, MD;  Location: [facility] ENDOSCOPY;  Service: Endoscopy;  Laterality: N/A;   COLONOSCOPY WITH PROPOFOL  N/A 07/26/2020   Procedure: COLONOSCOPY WITH PROPOFOL ;  Surgeon: Marnee Sink, MD;  Location: [facility];  Service: Endoscopy;  Laterality: N/A;   CYST REMOVAL LEG     and on shoulder    ESOPHAGOGASTRODUODENOSCOPY (EGD) WITH PROPOFOL  N/A 07/26/2020   Procedure: ESOPHAGOGASTRODUODENOSCOPY (EGD) WITH PROPOFOL ;  Surgeon: Marnee Sink, MD;  Location: [facility];  Service: Endoscopy;  Laterality: N/A;  Diabetic - oral meds   LOWER EXTREMITY ANGIOGRAPHY Left 09/29/2018   Procedure: LOWER EXTREMITY ANGIOGRAPHY;  Surgeon: Celso College, MD;  Location: [facility] INVASIVE CV LAB;  Service: Cardiovascular;  Laterality: Left;   LOWER EXTREMITY ANGIOGRAPHY Left 07/29/2023   Procedure: Lower Extremity Angiography;  Surgeon: Celso College, MD;  Location: [facility] INVASIVE CV LAB;  Service: Cardiovascular;  Laterality: Left;   LOWER EXTREMITY ANGIOGRAPHY Right 12/20/2023   Procedure: Lower Extremity Angiography;  Surgeon: Celso College, MD;  Location: [facility] INVASIVE CV LAB;  Service: Cardiovascular;  Laterality: Right;   LOWER EXTREMITY ANGIOGRAPHY Left 01/17/2024   Procedure: Lower Extremity Angiography;  Surgeon: Celso College, MD;  Location: [facility] INVASIVE CV LAB;  Service: Cardiovascular;  Laterality: Left;   LUNG BIOPSY  12/30/2011   has lung "spots"  PACEMAKER IMPLANT  07/14/2021   PACEMAKER LEADLESS INSERTION N/A 07/14/2021   Procedure: PACEMAKER LEADLESS INSERTION;  Surgeon: Percival Brace, MD;  Location: [facility] INVASIVE CV LAB;  Service: Cardiovascular;  Laterality: N/A;   PERIPHERAL VASCULAR CATHETERIZATION Left 06/01/2016   Procedure: Lower Extremity Angiography;  Surgeon: Celso College, MD;  Location: [facility] IN[facility] CV LAB;  Service: Cardiovascular;  Laterality: Left;   PERIPHERAL VASCULAR CATHETERIZATION N/A 06/01/2016   Procedure: Abdominal Aortogram w/Lower Extremity;  Surgeon: Celso College, MD;  Location: [facility] INV[facility] CV LAB;  Service: Cardiovascular;  Laterality: N/A;   PERIPHERAL VASCULAR CATHETERIZATION  06/01/2016   Procedure: Lower Extremity Intervention;  Surgeon: Celso College, MD;  Location: [facility] INV[facility] CV LAB;  Service: Cardiovascular;;   PERIPHERAL VASCULAR CATHETERIZATION Right 06/08/2016   Procedure: Lower Extremity Angiography;  Surgeon: Celso College, MD;  Location: [facility] IN[facility] CV LAB;  Service: Cardiovascular;  Laterality: Right;   PERIPHERAL VASCULAR CATHETERIZATION  06/08/2016   Procedure: Lower Extremity Intervention;  Surgeon: Celso College, MD;  Location: [facility] INVASIVE CV LAB;  Service: Cardiovascular;;   SUBMANDIBULAR GLAND EXCISION Left 12/06/2020   Procedure: EXCISION SUBMANDIBULAR GLAND;  Surgeon: Lesly Raspberry, MD;  Location: [facility];  Service: ENT;  Laterality: Left;  needs to be first case Diabetic - diet controlled   TEMPORARY PACEMAKER N/A 07/11/2021   Procedure: TEMPORARY PACEMAKER;  Surgeon: Percival Brace, MD;  Location: [facility] INV[facility] CV LAB;  Service: Cardiovascular;  Laterality: N/A;    Medications Prior to Admission  Medication Sig Dispense Refill Last Dose/Taking   albuterol  (VENTOLIN  HFA) 108 (90 Base) MCG/ACT inhaler INHALE 2 PUFFS BY MOUTH EVERY 6 HOURS AS NEEDED FOR WHEEZING OR SHORTNESS OF BREATH 3 each 5 01/17/2024 Morning   ALPRAZolam  (XANAX ) 0.5 MG tablet Take 1 tablet (0.5 mg total) by mouth 2 (two) times daily as needed for anxiety. 60 tablet 2 01/17/2024 Morning   aspirin  EC 81 MG tablet Take 1 tablet (81 mg total) by mouth daily. Swallow whole. 150 tablet 2 01/17/2024 Morning   atorvastatin  (LIPITOR) 10 MG tablet Take 1 tablet (10 mg total) by mouth daily. 30 tablet 11 01/17/2024 Morning   calcium  carbonate  (OSCAL) 1500 (600 Ca) MG TABS tablet Take 600 mg of elemental calcium  by mouth 2 (two) times daily with a meal.   01/17/2024 Morning   clopidogrel  (PLAVIX ) 75 MG tablet Take 1 tablet (75 mg total) by mouth daily. 90 tablet 3 01/17/2024 Morning   cyclobenzaprine  (FLEXERIL ) 10 MG tablet Take 1 tablet (10 mg total) by mouth at bedtime. Take one tab po qhs for back spasm prn only 30 tablet 3 Past Month   escitalopram  (LEXAPRO ) 10 MG tablet Take 1 tablet (10 mg total) by mouth daily. 90 tablet 1 01/17/2024 Morning   ferrous sulfate  325 (65 FE) MG tablet Take 325 mg by mouth 2 (two) times daily with a meal.   01/17/2024 Morning   furosemide  (LASIX ) 20 MG tablet Take 1 tablet (20 mg total) by mouth daily as needed. 30 tablet 5 01/16/2024   gabapentin  (NEURONTIN ) 300 MG capsule TAKE 1 CAPSULE BY MOUTH THREE TIMES DAILY 90 capsule 0 01/17/2024 Morning   insulin  glargine, 1 Unit Dial , (TOUJEO ) 300 UNIT/ML Solostar Pen Inject 10 Units into the skin daily. 1.5 mL 11 01/17/2024 Morning   ipratropium-albuterol  (DUONEB) 0.5-2.5 (3) MG/3ML SOLN USE 1 AMPULE IN NEBULIZER EVERY 4 HOURS AS NEEDED 360 mL 0 01/17/2024 Morning   lisinopril -hydrochlorothiazide  (ZESTORETIC ) 20-25 MG tablet Take 1 tablet by mouth  daily. 90 tablet 3 01/17/2024 Morning   metoprolol  succinate (TOPROL -XL) 25 MG 24 hr tablet Take 1 tablet (25 mg total) by mouth daily. 90 tablet 3 01/17/2024 Morning   mometasone -formoterol  (DULERA ) 200-5 MCG/ACT AERO Inhale 2 puffs by mouth twice daily 13 g 12 01/17/2024 Morning   potassium chloride  (KLOR-CON ) 10 MEQ tablet TAKE 1 TABLET BY MOUTH EVERY OTHER DAY 45 tablet 0 01/16/2024   SPIRIVA  HANDIHALER 18 MCG inhalation capsule INHALE THE CONTENTS OF 1 CAPSULES BY MOUTH ONCE DAILY - DO NOT SWALLOW CAPSULES 90 capsule 3 01/17/2024 Morning   vitamin B-12 (CYANOCOBALAMIN ) 100 MCG tablet Take 100 mcg by mouth daily.   01/17/2024 Morning   VITAMIN D , CHOLECALCIFEROL , PO Take 1 tablet by mouth in the morning.   01/17/2024 Morning   ACCU-CHEK  GUIDE TEST test strip USE TO TEST BLOOD SUGAR 5 TIMES DAILY 450 each 0    Accu-Chek Softclix Lancets lancets Use 1 lancet 3 times daily to check glucose for diabetes 300 each 1    Blood Glucose Monitoring Suppl (ACCU-CHEK GUIDE) w/Device KIT Use as directed Dx e11.65 1 kit 0    Continuous Blood Gluc Receiver (DEXCOM G7 RECEIVER) DEVI Use one as directed for uncontrolled dm 1 each 0    Continuous Glucose Sensor (DEXCOM G7 SENSOR) MISC USE AS DIRECTED. CHANGE EVERY 10 DAYS. 3 each 0    Ensifentrine  (OHTUVAYRE ) 3 MG/2.5ML SUSP Inhale 3 mg into the lungs in the morning and at bedtime. (Patient not taking: Reported on 01/17/2024) 150 mL 11 Not Taking   famotidine  (PEPCID ) 20 MG tablet Take 1 tablet (20 mg total) by mouth daily for 14 days. (Patient taking differently: Take 20 mg by mouth daily as needed for heartburn.) 14 tablet 0    Insulin  Pen Needle (RELION PEN NEEDLES) 31G X 6 MM MISC USE 1 PEN NEEDLE WITH INSULIN  PEN ONCE DAILY FOR DIABETES 50 each 3    methylPREDNISolone  (MEDROL  DOSEPAK) 4 MG TBPK tablet       OXYGEN  Inhale 4 L into the lungs. PT USES [facility] FOR OXYGEN       Social History   Socioeconomic History   Marital status: Widowed    Spouse name: Not on file   Number of children: Not on file   Years of education: Not on file   Highest education level: Not on file  Occupational History   Not on file  Tobacco Use   Smoking status: Former    Current packs/day: 0.00    Average packs/day: 1 pack/day for 37.0 years (37.0 ttl pk-yrs)    Types: Cigarettes    Start date: 02/06/1973    Quit date: 02/06/2010    Years since quitting: 13.9   Smokeless tobacco: Former    Types: Snuff  Vaping Use   Vaping status: Never Used  Substance and Sexual Activity   Alcohol use: Not Currently    Alcohol/week: 5.0 standard drinks of alcohol    Types: 5 Cans of beer per week    Comment: /h x of alcohol abuse -stopped 2012- now drinks 5 beer per week   Drug use: Not Currently    Types:  Marijuana, "Crack" cocaine, Cocaine    Comment: hx of cocaine use- last use 2015; last use marijuana6/22/19,   Sexual activity: Yes  Other Topics Concern   Not on file  Social History Narrative   Lives with Significant Other x 43 years   Social Drivers of Corporate investment banker Strain: Not on file  Food Insecurity: No Food Insecurity (01/17/2024)   Hunger Vital Sign    Worried About Running Out of Food in the Last Year: Never true    Ran Out of Food in the Last Year: Never true  Transportation Needs: No Transportation Needs (01/17/2024)   PRAPARE - Administrator, Civil Service (Medical): No    Lack of Transportation (Non-Medical): No  Physical Activity: Not on file  Stress: Not on file  Social Connections: Socially Integrated (01/17/2024)   Social Connection and Isolation Panel [NHANES]    Frequency of Communication with Friends and Family: More than three times a week    Frequency of Social Gatherings with Friends and Family: More than three times a week    Attends Religious Services: 1 to 4 times per year    Active Member of Clubs or Organizations: No    Attends Banker Meetings: 1 to 4 times per year    Marital Status: Married  Catering manager Violence: Not At Risk (01/17/2024)   Humiliation, Afraid, Rape, and Kick questionnaire    Fear of Current or Ex-Partner: No    Emotionally Abused: No    Physically Abused: No    Sexually Abused: No    Family History  Problem Relation Age of Onset   Diabetes Mother    Hypercholesterolemia Mother    Lung cancer Father    Diabetes Sister    Diabetes Sister    Hypertension Sister    Diabetes Maternal Grandmother    Diabetes Paternal Grandmother    Heart attack Brother    Coronary artery disease Brother    Vascular Disease Brother    Heart attack Brother    Breast cancer Neg Hx      Vitals:   01/19/24 1608 01/19/24 1948 01/20/24 0305 01/20/24 0822  BP: (!) 103/54 (!) 99/51 125/74 110/60  Pulse: 96 100  95 100  Resp: 17 17 20 19   Temp: 98.3 F (36.8 C) 99.3 F (37.4 C) 98.3 F (36.8 C) 99 F (37.2 C)  TempSrc: Oral Oral Oral Oral  SpO2: 98% 97% 100% 92%  Weight:      Height:        PHYSICAL EXAM General: chronically ill appearing elderly female, well nourished, in no acute distress. HEENT: Normocephalic and atraumatic. Neck: No JVD.   Lungs: Normal respiratory effort on 3L. CTAB Heart: HRRR. Normal S1 and S2 without gallops or murmurs.  Abdomen: Non-distended appearing.  Msk: Normal strength and tone for age. Extremities: Warm and well perfused. No clubbing, cyanosis. No edema.  Neuro: Alert and oriented X 3. Psych: Answers questions appropriately.   Labs: Basic Metabolic Panel: Recent Labs    01/20/24 0430  NA 142  K 4.1  CL 100  CO2 33*  GLUCOSE 108*  BUN 22  CREATININE 0.76  CALCIUM  8.9   Liver Function Tests: No results for input(s): "AST", "ALT", "ALKPHOS", "BILITOT", "PROT", "ALBUMIN" in the last 72 hours.  No results for input(s): "LIPASE", "AMYLASE" in the last 72 hours. CBC: Recent Labs    01/19/24 0207 01/20/24 0430  WBC 8.4 6.4  HGB 11.0* 10.2*  HCT 35.4* 33.6*  MCV 92.4 94.4  PLT 254 213   Cardiac Enzymes: No results for input(s): "CKTOTAL", "CKMB", "CKMBINDEX", "TROPONINIHS" in the last 72 hours. BNP: No results for input(s): "BNP" in the last 72 hours. D-Dimer: No results for input(s): "DDIMER" in the last 72 hours. Hemoglobin A1C: No results for input(s): "HGBA1C" in the last 72 hours.  Fasting Lipid Panel: No results for input(s): "CHOL", "HDL", "LDLCALC", "TRIG", "CHOLHDL", "LDLDIRECT" in the last 72 hours. Thyroid  Function Tests: No results for input(s): "TSH", "T4TOTAL", "T3FREE", "THYROIDAB" in the last 72 hours.  Invalid input(s): "FREET3" Anemia Panel: No results for input(s): "VITAMINB12", "FOLATE", "FERRITIN", "TIBC", "IRON ", "RETICCTPCT" in the last 72 hours.   Radiology: PERIPHERAL VASCULAR CATHETERIZATION Result Date:  01/17/2024 See surgical note for result.  VAS US  ABI WITH/WO TBI Result Date: 01/06/2024  LOWER EXTREMITY DOPPLER STUDY Patient Name:  ENGLAND GREB  Date of Exam:   01/05/2024 Medical Rec #: [id number]     Accession #:    [id number] Date of Birth: 1956-07-22     Patient Gender: F Patient Age:   50 years Exam Location:  [facility] Procedure:      VAS US  ABI WITH/WO TBI Referring Phys: Mikki Alexander --------------------------------------------------------------------------------  Indications: Claudication, and peripheral artery disease. Other Factors: Left SFA stents are known to be occluded.                Patient complains of constant numbness in left foot.                 07/29/2023: Aortogram and selective Left Lower                ExtremityAngiogram. Mechanical thrombectomy of the Left SFA and                Popliteal Artery with the [place] Rex device. Stent placement to the                Proximal Left SFA with 6 mm diameter by 5 cm length Viabahn                stent. Stent placement to the Left Popliteal Artery above the                knee with 6 mm diameter by 5 cm length Viabahn stent.                 12/20/2023: Aortogram and Selective Right Lower                ExtremityAngiogram. PTA of the Right SFA with 4 mm diameter by 22                cm length Lutonix drug coated angioplasty balloon. Stent                placement to the Right SFA with 6 mm by 20 cm length Life stent.  Vascular Interventions: 06/01/16: Left distal SFA/popliteal arteries PTAs with                         SFA stent x2;                         06/08/16: Left SFA PTA;                         09/29/18: Left SFA stent x2;. Comparison Study: 12/01/2023 Performing Technologist: Tonie Franks RVS  Examination Guidelines: A complete evaluation includes at minimum, Doppler waveform signals and systolic blood pressure reading at the level of bilateral brachial, anterior tibial, and posterior tibial arteries, when vessel segments are  accessible. Bilateral testing is considered an integral part of a complete examination. Photoelectric Plethysmograph (PPG) waveforms and toe systolic pressure readings are included  as required and additional duplex testing as needed. Limited examinations for reoccurring indications may be performed as noted.  ABI Findings: +---------+------------------+-----+--------+--------+ Right    Rt Pressure (mmHg)IndexWaveformComment  +---------+------------------+-----+--------+--------+ Brachial 115                                     +---------+------------------+-----+--------+--------+ ATA      100               0.87 biphasic         +---------+------------------+-----+--------+--------+ PTA      105               0.91 biphasic         +---------+------------------+-----+--------+--------+ Great Toe108               0.94 Normal           +---------+------------------+-----+--------+--------+ +---------+------------------+-----+-------------------+-------+ Left     Lt Pressure (mmHg)IndexWaveform           Comment +---------+------------------+-----+-------------------+-------+ Brachial 114                                               +---------+------------------+-----+-------------------+-------+ ATA      43                0.37 dampened monophasic        +---------+------------------+-----+-------------------+-------+ PTA                             Non pulsatile flow         +---------+------------------+-----+-------------------+-------+ PERO     46                0.40 dampened monophasic        +---------+------------------+-----+-------------------+-------+ Great Toe32                0.28                            +---------+------------------+-----+-------------------+-------+ +-------+-----------+-----------+------------+------------+ ABI/TBIToday's ABIToday's TBIPrevious ABIPrevious TBI  +-------+-----------+-----------+------------+------------+ Right  .91        .94        .73         .85          +-------+-----------+-----------+------------+------------+ Left   .40        .28        .86         .94          +-------+-----------+-----------+------------+------------+ Right ABIs and TBIs appear increased compared to prior study on 12/01/2023. Left ABIs and TBIs appear decreased compared to prior study on 12/01/2023.  Summary: Right: Resting right ankle-brachial index indicates mild right lower extremity arterial disease. The right toe-brachial index is normal. Left: Resting left ankle-brachial index indicates severe left lower extremity arterial disease. The left toe-brachial index is abnormal. *See table(s) above for measurements and observations.  Electronically signed by Mikki Alexander MD on 01/06/2024 at 8:12:02 AM.    Final     ECHO 06/15/2023  1. Left ventricular ejection fraction, by estimation, is >55%. The left ventricle has normal function. Left ventricular endocardial border not optimally defined to evaluate regional wall motion. Left ventricular diastolic parameters are consistent with Grade I diastolic dysfunction (  impaired relaxation).   2. Right ventricular systolic function is normal. The right ventricular size is normal. Tricuspid regurgitation signal is inadequate for assessing PA pressure.   3. The mitral valve was not well visualized. Trivial mitral valve regurgitation.   4. Tricuspid valve regurgitation is mild to moderate.  5. The aortic valve was not well visualized. Aortic valve regurgitation is not visualized. No aortic stenosis is present.   6. The inferior vena cava is normal in size with greater than 50% respiratory variability, suggesting right atrial pressure of 3 mmHg.   TELEMETRY reviewed by me 01/20/2024: sinus rhythm, rate 80s  EKG reviewed by me: sinus rhythm, RBBB rate 91 bpm  Data reviewed by me 01/20/2024: last 24h vitals tele labs imaging I/O  vascular surgery notes, Op note.  Principal Problem:   Ischemic leg Active Problems:   Atherosclerosis of artery of extremity with rest pain [facility])    ASSESSMENT AND PLAN:  Angela Jensen is a 68 y.o. female  with a past medical history of chronic HFpEF, SSS s/p micra (06/2021), hypertension, hyperlipidemia, aortic atherosclerosis, IDDM, PVD, COPD/asthma (baseline 2L O2), OSA, tobacco use who presented for outpatient procedure on 01/17/2024 underwent lower extremity angiogram on 06/02 and found that patient will need a left femoral endarterectomy with illiac stents and SFA intervention. Cardiology was consulted for preoperative cardiac evaluation.  #Preop Cardiac Evaluation # PAD with Left common femoral artery stenosis # Hypertension # Hyperlipidemia # Chronic HFpEF # SSS s/p micra (06/2021) Patient with PAD with rest pain LLE underwent left lower extremity angiogram that demonstrated 70% stenosis of left common femoral artery on 06/02. Patient requiring left femoral endarterectomy with illiac stents and SFA intervention. Echo from 05/2023 with pEF and low risk. EKG from today (06/03) with sinus rhythm, RBBB is unchanged from prior EKG, no acute ischemic changes. BP improving -Recommend resuming home antihypertensive medications (lisinopril -hydrochlorothiazide ) if BP remains stabilizes.  -Continue home metoprolol  succinate 25 mg daily with hold parameters. Hold if SBP < 100. -Continue ASA 81 mg, atorvastatin  10 mg daily. -Recommend continuing breathing treatments for COPD.   Patient is at elevated but acceptable risk for proceeding with surgery given cardiac history understanding risk/benefits.  Can proceed with left femoral endarterectomy without further cardiac diagnostics needed.   This patient's plan of care was discussed and created with Dr. Bob Burn and he is in agreement.  Signed: Creighton Doffing, PA-C  01/20/2024, 12:56 PM [facility] Cardiology

## 2024-01-20 NOTE — Plan of Care (Signed)

## 2024-01-20 NOTE — Inpatient Diabetes Management (Signed)
 Inpatient Diabetes Program Recommendations  AACE/ADA: New Consensus Statement on Inpatient Glycemic Control   Target Ranges:  Prepandial:   less than 140 mg/dL      Peak postprandial:   less than 180 mg/dL (1-2 hours)      Critically ill patients:  140 - 180 mg/dL    Latest Reference Range & Units 01/19/24 04:41 01/19/24 06:05 01/19/24 07:22 01/19/24 11:17 01/19/24 16:09 01/19/24 21:16 01/20/24 07:34  Glucose-Capillary 70 - 99 mg/dL 72 89 77 66 (L) 962 (H) 128 (H) 103 (H)   Review of Glycemic Control  Diabetes history: DM2 Outpatient Diabetes medications: Toujeo  10 units daily, Metformin  XR 1000 mg QAM Current orders for Inpatient glycemic control: Semglee  10 units daily, Novolog  0-6 units TID with meals, Novolog  0-5 units QHS   Inpatient Diabetes Program Recommendations:     Insulin : CBG 77 mg/dl at 9:52 am and 66 mg/dl at 84:13 am on 09/20/38.  Per MAR, Semglee  was not given on 01/19/24.   May want to consider discontinuing Semglee  for now and just use Novolog  correction while inpatient.  Thanks, Beacher Limerick, RN, MSN, CDCES Diabetes Coordinator Inpatient Diabetes Program [phone] (Team Pager from 8am to 5pm)

## 2024-01-20 NOTE — Progress Notes (Signed)
 Progress Note    01/20/2024 2:53 PM 3 Days Post-Op  Subjective:  Angela Jensen is a 68 yo female now POD #1 from left lower extremity angiogram. Patient had issues with HTN yesterday after the procedure and was treated with labetalol  and Hydralazine . This morning patient blood pressure  was low and she was dizzy. Bolus of LR 500's cc's given. She was also given some benadryl  for itching from the use of oxycodone . Therefore she is sleepy. Daughter is at the bedside this morning. No complaints overnight. Vitals remain stable.    Review of Systems  Constitutional:  Constitutional negative. HENT: HENT negative.  Eyes: Eyes negative.  Respiratory: Respiratory negative.  Cardiovascular: Positive for claudication.  GI: Gastrointestinal negative.  GU: Genitourinary negative. Musculoskeletal: Positive for leg pain.  Skin: Skin negative.  Neurological: Positive for dizziness.  Hematologic: Hematologic/lymphatic negative.  Psychiatric: Psychiatric negative.  All other systems reviewed and are negative     Vitals:   01/20/24 0305 01/20/24 0822  BP: 125/74 110/60  Pulse: 95 100  Resp: 20 19  Temp: 98.3 F (36.8 C) 99 F (37.2 C)  SpO2: 100% 92%   Physical Exam: Cardiac:  RRR, Normal S1,S2. No rubs clicks gallup's or murmurs.  Lungs:  Clear throughout on auscultation, normal labored breathing. No rales rhonchi or wheezing Incisions:  Right groin puncture site dressing clean dry and intact. No hematoma, seroma or infection to note.                     Extremities:  All extremities warm to touch with palpable pulses except the left lower extremity.  Left lower extremity is cool to touch and unable to palpate pulses. Abdomen: Positive bowel sounds throughout, soft, nondistended and nontender. Neurologic: Alert and oriented x 3, answers questions but very sleepy this morning.   CBC    Component Value Date/Time   WBC 6.4 01/20/2024 0430   RBC 3.56 (L) 01/20/2024 0430   HGB 10.2 (L)  01/20/2024 0430   HGB 12.7 10/08/2023 1004   HGB 11.6 10/04/2023 1047   HCT 33.6 (L) 01/20/2024 0430   HCT 37.6 10/04/2023 1047   PLT 213 01/20/2024 0430   PLT 264 10/08/2023 1004   PLT 236 10/04/2023 1047   MCV 94.4 01/20/2024 0430   MCV 88 10/04/2023 1047   MCV 90 04/17/2014 1055   MCH 28.7 01/20/2024 0430   MCHC 30.4 01/20/2024 0430   RDW 13.9 01/20/2024 0430   RDW 12.9 10/04/2023 1047   RDW 13.6 04/17/2014 1055   LYMPHSABS 1.0 10/08/2023 1004   LYMPHSABS 0.9 10/04/2023 1047   LYMPHSABS 1.1 04/17/2014 1055   MONOABS 0.5 10/08/2023 1004   MONOABS 0.3 04/17/2014 1055   EOSABS 0.1 10/08/2023 1004   EOSABS 0.1 10/04/2023 1047   EOSABS 0.2 04/17/2014 1055   BASOSABS 0.0 10/08/2023 1004   BASOSABS 0.0 10/04/2023 1047   BASOSABS 0.1 04/17/2014 1055    BMET    Component Value Date/Time   NA 142 01/20/2024 0430   NA 144 10/04/2023 1047   NA 141 04/17/2014 1055   K 4.1 01/20/2024 0430   K 3.9 04/17/2014 1055   CL 100 01/20/2024 0430   CL 102 04/17/2014 1055   CO2 33 (H) 01/20/2024 0430   CO2 30 04/17/2014 1055   GLUCOSE 108 (H) 01/20/2024 0430   GLUCOSE 201 (H) 04/17/2014 1055   BUN 22 01/20/2024 0430   BUN 12 10/04/2023 1047   BUN 10 04/17/2014 1055   CREATININE  0.76 01/20/2024 0430   CREATININE 0.84 07/06/2023 1016   CREATININE 0.90 04/17/2014 1055   CALCIUM  8.9 01/20/2024 0430   CALCIUM  9.1 04/17/2014 1055   GFRNONAA >60 01/20/2024 0430   GFRNONAA >60 07/06/2023 1016   GFRNONAA >60 04/17/2014 1055   GFRAA >60 05/05/2020 0558   GFRAA >60 04/17/2014 1055    INR    Component Value Date/Time   INR 1.1 01/17/2024 1208     Intake/Output Summary (Last 24 hours) at 01/20/2024 1453 Last data filed at 01/20/2024 1327 Gross per 24 hour  Intake 660 ml  Output --  Net 660 ml     Assessment/Plan:  68 y.o. female is s/p left lower extremity angiogram  3 Days Post-Op    PLAN Vascular surgery plans on taking the patient to the operating room tomorrow Friday 01/21/24  for left lower extremity femoral endarterectomy with iliac stent placement.  I discussed in detail with the patient and daughter at the bedside this morning the procedure, benefits, risk, complications.  They both verbalized her understanding.  Answered all their questions this morning.  Patient was made n.p.o. after midnight tonight for operating room tomorrow.   DVT prophylaxis: ASA 81 mg daily, Lipitor 10 mg daily and heparin  infusion.     Annamaria Barrette [facility] 01/20/2024 2:53 PM

## 2024-01-21 ENCOUNTER — Encounter
Admission: RE | Disposition: A | Discharge status: Home Health | Payer: Self-pay | Source: Home / Self Care | Attending: Vascular Surgery

## 2024-01-21 ENCOUNTER — Inpatient Hospital Stay: Admitting: Anesthesiology

## 2024-01-21 ENCOUNTER — Inpatient Hospital Stay

## 2024-01-21 ENCOUNTER — Encounter: Payer: Self-pay | Admitting: Oncology

## 2024-01-21 ENCOUNTER — Encounter: Payer: Self-pay | Admitting: Vascular Surgery

## 2024-01-21 ENCOUNTER — Other Ambulatory Visit: Payer: Self-pay

## 2024-01-21 DIAGNOSIS — I70222 Atherosclerosis of native arteries of extremities with rest pain, left leg: Secondary | ICD-10-CM | POA: Diagnosis not present

## 2024-01-21 DIAGNOSIS — T82868A Thrombosis of vascular prosthetic devices, implants and grafts, initial encounter: Secondary | ICD-10-CM

## 2024-01-21 DIAGNOSIS — I743 Embolism and thrombosis of arteries of the lower extremities: Secondary | ICD-10-CM

## 2024-01-21 DIAGNOSIS — Z9889 Other specified postprocedural states: Secondary | ICD-10-CM

## 2024-01-21 HISTORY — PX: ENDARTERECTOMY FEMORAL: SHX5804

## 2024-01-21 HISTORY — PX: INSERTION OF ILIAC STENT: SHX6256

## 2024-01-21 HISTORY — PX: APPLICATION OF CELL SAVER: SHX7529

## 2024-01-21 LAB — CBC
HCT: 30.8 % — ABNORMAL LOW (ref 36.0–46.0)
Hemoglobin: 9.4 g/dL — ABNORMAL LOW (ref 12.0–15.0)
MCH: 29 pg (ref 26.0–34.0)
MCHC: 30.5 g/dL (ref 30.0–36.0)
MCV: 95.1 fL (ref 80.0–100.0)
Platelets: 200 10*3/uL (ref 150–400)
RBC: 3.24 MIL/uL — ABNORMAL LOW (ref 3.87–5.11)
RDW: 13.8 % (ref 11.5–15.5)
WBC: 5.3 10*3/uL (ref 4.0–10.5)
nRBC: 0 % (ref 0.0–0.2)

## 2024-01-21 LAB — GLUCOSE, CAPILLARY
Glucose-Capillary: 156 mg/dL — ABNORMAL HIGH (ref 70–99)
Glucose-Capillary: 208 mg/dL — ABNORMAL HIGH (ref 70–99)
Glucose-Capillary: 222 mg/dL — ABNORMAL HIGH (ref 70–99)
Glucose-Capillary: 184 mg/dL — ABNORMAL HIGH (ref 70–99)

## 2024-01-21 LAB — MRSA NEXT GEN BY PCR, NASAL: MRSA by PCR Next Gen: NOT DETECTED

## 2024-01-21 LAB — HEPARIN LEVEL (UNFRACTIONATED): Heparin Unfractionated: 0.33 [IU]/mL (ref 0.30–0.70)

## 2024-01-21 SURGERY — ENDARTERECTOMY, FEMORAL
Anesthesia: General | Laterality: Left

## 2024-01-21 MED ORDER — LACTATED RINGERS IV SOLN: INTRAVENOUS | Status: DC

## 2024-01-21 MED ORDER — LIDOCAINE HCL (CARDIAC) PF 100 MG/5ML IV SOSY
PREFILLED_SYRINGE | INTRAVENOUS | Status: DC | PRN
  Administered 2024-01-21: 60 mg via INTRAVENOUS

## 2024-01-21 MED ORDER — OXYCODONE HCL 5 MG/5ML PO SOLN: 5.0000 mg | Freq: Once | ORAL | Status: DC | PRN

## 2024-01-21 MED ORDER — LIDOCAINE HCL (PF) 2 % IJ SOLN
INTRAMUSCULAR | Status: AC
  Filled 2024-01-21: qty 5

## 2024-01-21 MED ORDER — HEMOSTATIC AGENTS (NO CHARGE) OPTIME
TOPICAL | Status: DC | PRN
  Administered 2024-01-21: 1 via TOPICAL

## 2024-01-21 MED ORDER — SUGAMMADEX SODIUM 200 MG/2ML IV SOLN
INTRAVENOUS | Status: DC | PRN
  Administered 2024-01-21: 150 mg via INTRAVENOUS

## 2024-01-21 MED ORDER — OXYCODONE HCL 5 MG PO TABS: 5.0000 mg | ORAL_TABLET | Freq: Once | ORAL | Status: DC | PRN

## 2024-01-21 MED ORDER — ORAL CARE MOUTH RINSE: 15.0000 mL | Freq: Once | OROMUCOSAL | Status: DC

## 2024-01-21 MED ORDER — ALTEPLASE 2 MG IJ SOLR
INTRAMUSCULAR | Status: DC | PRN
  Administered 2024-01-21: 4 mg

## 2024-01-21 MED ORDER — ACETAMINOPHEN 10 MG/ML IV SOLN: 1000.0000 mg | Freq: Once | INTRAVENOUS | Status: DC | PRN

## 2024-01-21 MED ORDER — FENTANYL CITRATE (PF) 100 MCG/2ML IJ SOLN
25.0000 ug | INTRAMUSCULAR | Status: DC | PRN
  Administered 2024-01-21 (×2): 25 ug via INTRAVENOUS

## 2024-01-21 MED ORDER — CEFAZOLIN SODIUM-DEXTROSE 2-4 GM/100ML-% IV SOLN
INTRAVENOUS | Status: AC
  Filled 2024-01-21: qty 100

## 2024-01-21 MED ORDER — FENTANYL CITRATE (PF) 100 MCG/2ML IJ SOLN
INTRAMUSCULAR | Status: DC | PRN
  Administered 2024-01-21: 25 ug via INTRAVENOUS
  Administered 2024-01-21: 50 ug via INTRAVENOUS
  Administered 2024-01-21: 25 ug via INTRAVENOUS

## 2024-01-21 MED ORDER — PROPOFOL 10 MG/ML IV BOLUS
INTRAVENOUS | Status: DC | PRN
  Administered 2024-01-21: 100 mg via INTRAVENOUS

## 2024-01-21 MED ORDER — CHLORHEXIDINE GLUCONATE CLOTH 2 % EX PADS
6.0000 | MEDICATED_PAD | Freq: Every day | CUTANEOUS | Status: DC
  Administered 2024-01-21: 6 via TOPICAL

## 2024-01-21 MED ORDER — SODIUM CHLORIDE 0.9 % IV SOLN: INTRAVENOUS | Status: DC | PRN

## 2024-01-21 MED ORDER — FENTANYL CITRATE (PF) 100 MCG/2ML IJ SOLN
INTRAMUSCULAR | Status: AC
  Filled 2024-01-21: qty 2

## 2024-01-21 MED ORDER — HEPARIN SODIUM (PORCINE) 1000 UNIT/ML IJ SOLN
INTRAMUSCULAR | Status: AC
  Filled 2024-01-21: qty 10

## 2024-01-21 MED ORDER — ALTEPLASE 2 MG IJ SOLR
INTRAMUSCULAR | Status: AC
  Filled 2024-01-21: qty 4

## 2024-01-21 MED ORDER — ACETAMINOPHEN 10 MG/ML IV SOLN
INTRAVENOUS | Status: DC | PRN
  Administered 2024-01-21: 1000 mg via INTRAVENOUS

## 2024-01-21 MED ORDER — VANCOMYCIN HCL 1000 MG IV SOLR
INTRAVENOUS | Status: AC
  Filled 2024-01-21: qty 20

## 2024-01-21 MED ORDER — CHLORHEXIDINE GLUCONATE 0.12 % MT SOLN: 15.0000 mL | Freq: Once | OROMUCOSAL | Status: DC

## 2024-01-21 MED ORDER — PHENYLEPHRINE 80 MCG/ML (10ML) SYRINGE FOR IV PUSH (FOR BLOOD PRESSURE SUPPORT)
PREFILLED_SYRINGE | INTRAVENOUS | Status: DC | PRN
  Administered 2024-01-21: 120 ug via INTRAVENOUS
  Administered 2024-01-21: 80 ug via INTRAVENOUS
  Administered 2024-01-21: 40 ug via INTRAVENOUS
  Administered 2024-01-21 (×2): 80 ug via INTRAVENOUS
  Administered 2024-01-21 (×2): 40 ug via INTRAVENOUS

## 2024-01-21 MED ORDER — STERILE WATER FOR INJECTION IJ SOLN
INTRAMUSCULAR | Status: AC
  Filled 2024-01-21: qty 10

## 2024-01-21 MED ORDER — LACTATED RINGERS IV SOLN: INTRAVENOUS | Status: DC | PRN

## 2024-01-21 MED ORDER — DIPHENHYDRAMINE HCL 50 MG/ML IJ SOLN
INTRAMUSCULAR | Status: DC | PRN
Start: 2024-01-21 — End: 2024-01-21
  Administered 2024-01-21: 25 mg via INTRAVENOUS

## 2024-01-21 MED ORDER — ROCURONIUM BROMIDE 10 MG/ML (PF) SYRINGE
PREFILLED_SYRINGE | INTRAVENOUS | Status: AC
  Filled 2024-01-21: qty 10

## 2024-01-21 MED ORDER — VANCOMYCIN HCL 1 G IV SOLR
INTRAVENOUS | Status: DC | PRN
  Administered 2024-01-21: 1000 mg

## 2024-01-21 MED ORDER — ONDANSETRON HCL 4 MG/2ML IJ SOLN
INTRAMUSCULAR | Status: AC
  Filled 2024-01-21: qty 2

## 2024-01-21 MED ORDER — SODIUM CHLORIDE 0.9 % IV SOLN
INTRAVENOUS | Status: DC | PRN
  Administered 2024-01-21: 500 mL

## 2024-01-21 MED ORDER — ACETAMINOPHEN 10 MG/ML IV SOLN
INTRAVENOUS | Status: AC
  Filled 2024-01-21: qty 100

## 2024-01-21 MED ORDER — DEXMEDETOMIDINE HCL IN NACL 80 MCG/20ML IV SOLN
INTRAVENOUS | Status: DC | PRN
  Administered 2024-01-21 (×2): 8 ug via INTRAVENOUS

## 2024-01-21 MED ORDER — MIDAZOLAM HCL 2 MG/2ML IJ SOLN
INTRAMUSCULAR | Status: AC
  Filled 2024-01-21: qty 2

## 2024-01-21 MED ORDER — ROCURONIUM BROMIDE 100 MG/10ML IV SOLN
INTRAVENOUS | Status: DC | PRN
  Administered 2024-01-21 (×2): 20 mg via INTRAVENOUS
  Administered 2024-01-21: 50 mg via INTRAVENOUS
  Administered 2024-01-21: 20 mg via INTRAVENOUS
  Administered 2024-01-21: 30 mg via INTRAVENOUS

## 2024-01-21 MED ORDER — SODIUM CHLORIDE 0.9 % IV SOLN
INTRAVENOUS | Status: DC
Start: 2024-01-21 — End: 2024-01-21

## 2024-01-21 MED ORDER — ALBUTEROL SULFATE HFA 108 (90 BASE) MCG/ACT IN AERS
INHALATION_SPRAY | RESPIRATORY_TRACT | Status: DC | PRN
Start: 2024-01-21 — End: 2024-01-21
  Administered 2024-01-21: 6 via RESPIRATORY_TRACT

## 2024-01-21 MED ORDER — HEPARIN SODIUM (PORCINE) 5000 UNIT/ML IJ SOLN
INTRAMUSCULAR | Status: AC
  Filled 2024-01-21: qty 1

## 2024-01-21 MED ORDER — GENTAMICIN SULFATE 40 MG/ML IJ SOLN
INTRAMUSCULAR | Status: AC
  Filled 2024-01-21: qty 4

## 2024-01-21 MED ORDER — PHENYLEPHRINE HCL-NACL 20-0.9 MG/250ML-% IV SOLN
INTRAVENOUS | Status: DC | PRN
Start: 2024-01-21 — End: 2024-01-21
  Administered 2024-01-21: 20 ug/min via INTRAVENOUS

## 2024-01-21 MED ORDER — HEPARIN 30,000 UNITS/1000 ML (OHS) CELLSAVER SOLUTION
Status: AC | PRN
  Administered 2024-01-21: 1

## 2024-01-21 MED ORDER — DEXAMETHASONE SODIUM PHOSPHATE 10 MG/ML IJ SOLN
INTRAMUSCULAR | Status: DC | PRN
  Administered 2024-01-21: 10 mg via INTRAVENOUS

## 2024-01-21 MED ORDER — ONDANSETRON HCL 4 MG/2ML IJ SOLN
INTRAMUSCULAR | Status: DC | PRN
  Administered 2024-01-21: 4 mg via INTRAVENOUS

## 2024-01-21 MED ORDER — HEPARIN SODIUM (PORCINE) 1000 UNIT/ML IJ SOLN
INTRAMUSCULAR | Status: DC | PRN
Start: 2024-01-21 — End: 2024-01-21
  Administered 2024-01-21: 5000 [IU] via INTRAVENOUS
  Administered 2024-01-21 (×2): 2000 [IU] via INTRAVENOUS

## 2024-01-21 MED ORDER — GENTAMICIN SULFATE 40 MG/ML IJ SOLN
INTRAMUSCULAR | Status: DC | PRN
  Administered 2024-01-21: 160 mg

## 2024-01-21 MED ORDER — NITROGLYCERIN IN D5W 200-5 MCG/ML-% IV SOLN
INTRAVENOUS | Status: AC
  Filled 2024-01-21: qty 250

## 2024-01-21 SURGICAL SUPPLY — 71 items
BAG DECANTER FOR FLEXI CONT (MISCELLANEOUS) ×1 IMPLANT
BAG ISOLATATION DRAPE 20X20 ST (DRAPES) IMPLANT
BALLOON LUTONIX 018 4X150X130 (BALLOONS) IMPLANT
BALLOON LUTONIX 018 6X80X130 (BALLOONS) IMPLANT
BALLOON ULTRVRSE 2X300X150 OTW (BALLOONS) IMPLANT
BALLOON ULTRVS 018 2.5X150X150 (BALLOONS) IMPLANT
BLADE SURG 15 STRL LF DISP TIS (BLADE) ×1 IMPLANT
BLADE SURG SZ11 CARB STEEL (BLADE) ×1 IMPLANT
BRUSH SCRUB EZ 4% CHG (MISCELLANEOUS) ×1 IMPLANT
CANISTER PENUMBRA ENGINE (MISCELLANEOUS) IMPLANT
CATH BEACON 5 .035 40 KMP TP (CATHETERS) IMPLANT
CATH BEACON 5 .038 100 VERT TP (CATHETERS) IMPLANT
CATH LIGHTNING BOLT 6 XTX (CATHETERS) IMPLANT
CHLORAPREP W/TINT 26 (MISCELLANEOUS) ×1 IMPLANT
CLAMP SUTURE YELLOW 5 PAIRS (MISCELLANEOUS) ×1 IMPLANT
CLEANSER WND VASHE 34 (WOUND CARE) ×1 IMPLANT
CLIP APPLIE 11 MED OPEN (CLIP) IMPLANT
CLIP APPLIE 9.375 SM OPEN (CLIP) IMPLANT
DEVICE PRESTO INFLATION (MISCELLANEOUS) IMPLANT
DRAPE C-ARM XRAY 36X54 (DRAPES) ×1 IMPLANT
DRAPE INCISE IOBAN 66X45 STRL (DRAPES) ×1 IMPLANT
DRESSING SURGICEL FIBRLLR 1X2 (HEMOSTASIS) ×1 IMPLANT
DRSG OPSITE POSTOP 4X6 (GAUZE/BANDAGES/DRESSINGS) IMPLANT
ELECT CAUTERY BLADE 6.4 (BLADE) IMPLANT
ELECTRODE REM PT RTRN 9FT ADLT (ELECTROSURGICAL) ×1 IMPLANT
GAUZE 4X4 16PLY ~~LOC~~+RFID DBL (SPONGE) IMPLANT
GLIDEWIRE ADV .035X260CM (WIRE) IMPLANT
GLOVE BIO SURGEON STRL SZ7 (GLOVE) ×3 IMPLANT
GOWN STRL REUS W/ TWL LRG LVL3 (GOWN DISPOSABLE) ×2 IMPLANT
GOWN STRL REUS W/ TWL XL LVL3 (GOWN DISPOSABLE) ×1 IMPLANT
GOWN STRL REUS W/TWL 2XL LVL3 (GOWN DISPOSABLE) ×1 IMPLANT
GRAFT VASC PATCH XENOSURE 1X14 (Vascular Products) IMPLANT
HEMOSTAT HEMOBLAST BELLOWS (HEMOSTASIS) IMPLANT
IV NS 500ML BAXH (IV SOLUTION) ×1 IMPLANT
KIT STIMULAN RAPID CURE 5CC (Orthopedic Implant) IMPLANT
KIT TURNOVER KIT A (KITS) ×1 IMPLANT
LABEL OR SOLS (LABEL) ×1 IMPLANT
LOOP VESSEL MAXI 1X406 RED (MISCELLANEOUS) ×2 IMPLANT
LOOP VESSEL MINI 0.8X406 BLUE (MISCELLANEOUS) ×2 IMPLANT
MANIFOLD NEPTUNE II (INSTRUMENTS) ×1 IMPLANT
NDL SAFETY ECLIPSE 18X1.5 (NEEDLE) ×1 IMPLANT
NS IRRIG 500ML POUR BTL (IV SOLUTION) ×1 IMPLANT
PACK ANGIOGRAPHY (CUSTOM PROCEDURE TRAY) IMPLANT
PACK BASIN MAJOR ARMC (MISCELLANEOUS) ×1 IMPLANT
PACK UNIVERSAL (MISCELLANEOUS) ×1 IMPLANT
PENCIL SMOKE EVACUATOR (MISCELLANEOUS) IMPLANT
RETRACTOR TRAXI PANNICULUS (MISCELLANEOUS) IMPLANT
SET WALTER ACTIVATION W/DRAPE (SET/KITS/TRAYS/PACK) ×1 IMPLANT
SHEATH BRITE TIP 8FRX11 (SHEATH) IMPLANT
SPONGE T-LAP 18X18 ~~LOC~~+RFID (SPONGE) IMPLANT
STAPLER SKIN PROX 35W (STAPLE) ×1 IMPLANT
STENT VIABAHN 6X150X120 (Permanent Stent) IMPLANT
STENT VIABAHN 7X100X120 (Permanent Stent) IMPLANT
STENT VIABAHN 8X7.5X120 (Permanent Stent) IMPLANT
SUT PROLENE 5 0 RB 1 DA (SUTURE) ×2 IMPLANT
SUT PROLENE 6 0 BV (SUTURE) ×4 IMPLANT
SUT PROLENE 7 0 BV 1 (SUTURE) ×2 IMPLANT
SUT SILK 2-0 18XBRD TIE 12 (SUTURE) ×1 IMPLANT
SUT SILK 3-0 18XBRD TIE 12 (SUTURE) ×1 IMPLANT
SUT SILK 4-0 18XBRD TIE 12 (SUTURE) ×1 IMPLANT
SUT VIC AB 2-0 CT1 TAPERPNT 27 (SUTURE) ×2 IMPLANT
SUT VIC AB 3-0 SH 27X BRD (SUTURE) ×1 IMPLANT
SUT VICRYL+ 3-0 36IN CT-1 (SUTURE) ×2 IMPLANT
SUTURE EHLN 3-0 FS-10 30 BLK (SUTURE) ×1 IMPLANT
SYR 5ML LL (SYRINGE) ×1 IMPLANT
TRAP FLUID SMOKE EVACUATOR (MISCELLANEOUS) ×1 IMPLANT
TRAY FOLEY MTR SLVR 16FR STAT (SET/KITS/TRAYS/PACK) ×1 IMPLANT
WATER STERILE IRR 500ML POUR (IV SOLUTION) ×1 IMPLANT
WIRE COMMAND ST ANG 014 300 (WIRE) IMPLANT
WIRE G V18X300CM (WIRE) IMPLANT
WIRE J 3MM .035X145CM (WIRE) IMPLANT

## 2024-01-21 NOTE — TOC Progression Note (Signed)
 Transition of Care [facility]) - Progression Note    Patient Details  Name: Angela Jensen MRN: [id number] Date of Birth: 1956-01-26  Transition of [facility]) CM/SW Contact  Brasen Bundren A Sharisa Toves, RN Phone Number: 01/21/2024, 8:16 AM  Clinical Narrative:    Chart reviewed.  Noted that patient is schedule for left lower extremity femoral endarterectomy with iliac stent placement today.    TOC will continue to follow for discharge needs.          Expected Discharge Plan and Services                                               Social Determinants of Health (SDOH) Interventions SDOH Screenings   Food Insecurity: No Food Insecurity (01/17/2024)  Housing: Unknown (01/17/2024)  Transportation Needs: No Transportation Needs (01/17/2024)  Utilities: Not At Risk (01/17/2024)  Alcohol Screen: Low Risk  (12/11/2021)  Depression (PHQ2-9): Low Risk  (10/13/2023)  Social Connections: Socially Integrated (01/17/2024)  Tobacco Use: Medium Risk (01/12/2024)    Readmission Risk Interventions    06/13/2021    2:40 PM 05/05/2021   12:24 PM  Readmission Risk Prevention Plan  Transportation Screening Complete Complete  PCP or Specialist Appt within 5-7 Days  Complete  Home Care Screening  Complete  Medication Review (RN CM)  Complete  Social Work Consult for Recovery Care Planning/Counseling Complete   Palliative Care Screening Not Applicable   Medication Review Oceanographer) Complete

## 2024-01-21 NOTE — Plan of Care (Signed)
 Continuing with plan of care.

## 2024-01-21 NOTE — Interval H&P Note (Signed)
 History and Physical Interval Note:  01/21/2024 9:10 AM  Angela Jensen  has presented today for surgery, with the diagnosis of atherosclerosis of artery of extremity with rest pain.  The various methods of treatment have been discussed with the patient and family. After consideration of risks, benefits and other options for treatment, the patient has consented to  Procedure(s) with comments: ENDARTERECTOMY, FEMORAL (Left) INSERTION, STENT, ARTERY, ILIAC (Left) - AND SFA STENTS APPLICATION OF CELL SAVER (Left) as a surgical intervention.  The patient's history has been reviewed, patient examined, no change in status, stable for surgery.  I have reviewed the patient's chart and labs.  Questions were answered to the patient's satisfaction.     [name]

## 2024-01-21 NOTE — Consult Note (Signed)
 PHARMACY - ANTICOAGULATION CONSULT NOTE  Pharmacy Consult for heparin   Indication: lower extremity ischemia   Allergies  Allergen Reactions   Iodinated Contrast Media Itching   Trelegy Ellipta  [Fluticasone -Umeclidin-Vilant]     Had breathing issues    Patient Measurements: Height: 4\' 11"  (149.9 cm) Weight: 54 kg (119 lb 0.8 oz) IBW/kg (Calculated) : 43.2 HEPARIN  DW (KG): 54  Vital Signs: Temp: 98.4 F (36.9 C) (06/06 0456) Temp Source: Oral (06/06 0456) BP: 126/78 (06/06 0456) Pulse Rate: 84 (06/06 0456)  Labs: Recent Labs    01/19/24 0207 01/19/24 0847 01/19/24 1814 01/20/24 0430 01/21/24 0431  HGB 11.0*  --   --  10.2* 9.4*  HCT 35.4*  --   --  33.6* 30.8*  PLT 254  --   --  213 200  HEPARINUNFRC 0.27*   < > 0.38 0.36 0.33  CREATININE  --   --   --  0.76  --    < > = values in this interval not displayed.    Estimated Creatinine Clearance: 50.5 mL/min (by C-G formula based on SCr of 0.76 mg/dL).   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/2016   Cancer (HCC) 2012   Right Lung CA   COPD (chronic obstructive pulmonary disease) (HCC)    Depression    Diabetes mellitus without complication (HCC)    Patient takes Janumet   Essential hypertension 05/26/2016   Heart failure (HCC) 2022   Hydropneumothorax 05/03/2020   Hypercholesteremia    Oxygen  dependent    2L at nite    PAD (peripheral artery disease) (HCC) 06/22/2016   Peripheral vascular disease (HCC)    Personal history of radiation therapy    Shortness of breath dyspnea    with exertion    Sialolithiasis    Sleep apnea    Wears dentures    full upper and lower    Medications:  Medications Prior to Admission  Medication Sig Dispense Refill Last Dose/Taking   albuterol  (VENTOLIN  HFA) 108 (90 Base) MCG/ACT inhaler INHALE 2 PUFFS BY MOUTH EVERY 6 HOURS AS  NEEDED FOR WHEEZING OR SHORTNESS OF BREATH 3 each 5 01/17/2024 Morning   ALPRAZolam  (XANAX ) 0.5 MG tablet Take 1 tablet (0.5 mg total) by mouth 2 (two) times daily as needed for anxiety. 60 tablet 2 01/17/2024 Morning   aspirin  EC 81 MG tablet Take 1 tablet (81 mg total) by mouth daily. Swallow whole. 150 tablet 2 01/17/2024 Morning   atorvastatin  (LIPITOR) 10 MG tablet Take 1 tablet (10 mg total) by mouth daily. 30 tablet 11 01/17/2024 Morning   calcium  carbonate (OSCAL) 1500 (600 Ca) MG TABS tablet Take 600 mg of elemental calcium  by mouth 2 (two) times daily with a meal.   01/17/2024 Morning   clopidogrel  (PLAVIX ) 75 MG tablet Take 1 tablet (75 mg total) by mouth daily. 90 tablet 3 01/17/2024 Morning   cyclobenzaprine  (FLEXERIL ) 10 MG tablet Take 1 tablet (10 mg total) by mouth at bedtime. Take one tab po qhs for back spasm prn only 30 tablet 3 Past Month   escitalopram  (LEXAPRO ) 10 MG tablet Take 1 tablet (10 mg total) by mouth daily. 90 tablet 1 01/17/2024 Morning   ferrous sulfate  325 (65 FE) MG tablet Take 325 mg by mouth 2 (two) times daily with a meal.   01/17/2024 Morning   furosemide  (LASIX )  20 MG tablet Take 1 tablet (20 mg total) by mouth daily as needed. 30 tablet 5 01/16/2024   gabapentin  (NEURONTIN ) 300 MG capsule TAKE 1 CAPSULE BY MOUTH THREE TIMES DAILY 90 capsule 0 01/17/2024 Morning   insulin  glargine, 1 Unit Dial , (TOUJEO ) 300 UNIT/ML Solostar Pen Inject 10 Units into the skin daily. 1.5 mL 11 01/17/2024 Morning   ipratropium-albuterol  (DUONEB) 0.5-2.5 (3) MG/3ML SOLN USE 1 AMPULE IN NEBULIZER EVERY 4 HOURS AS NEEDED 360 mL 0 01/17/2024 Morning   lisinopril -hydrochlorothiazide  (ZESTORETIC ) 20-25 MG tablet Take 1 tablet by mouth daily. 90 tablet 3 01/17/2024 Morning   metoprolol  succinate (TOPROL -XL) 25 MG 24 hr tablet Take 1 tablet (25 mg total) by mouth daily. 90 tablet 3 01/17/2024 Morning   mometasone -formoterol  (DULERA ) 200-5 MCG/ACT AERO Inhale 2 puffs by mouth twice daily 13 g 12 01/17/2024 Morning    potassium chloride  (KLOR-CON ) 10 MEQ tablet TAKE 1 TABLET BY MOUTH EVERY OTHER DAY 45 tablet 0 01/16/2024   SPIRIVA  HANDIHALER 18 MCG inhalation capsule INHALE THE CONTENTS OF 1 CAPSULES BY MOUTH ONCE DAILY - DO NOT SWALLOW CAPSULES 90 capsule 3 01/17/2024 Morning   vitamin B-12 (CYANOCOBALAMIN ) 100 MCG tablet Take 100 mcg by mouth daily.   01/17/2024 Morning   VITAMIN D , CHOLECALCIFEROL , PO Take 1 tablet by mouth in the morning.   01/17/2024 Morning   ACCU-CHEK GUIDE TEST test strip USE TO TEST BLOOD SUGAR 5 TIMES DAILY 450 each 0    Accu-Chek Softclix Lancets lancets Use 1 lancet 3 times daily to check glucose for diabetes 300 each 1    Blood Glucose Monitoring Suppl (ACCU-CHEK GUIDE) w/Device KIT Use as directed Dx e11.65 1 kit 0    Continuous Blood Gluc Receiver (DEXCOM G7 RECEIVER) DEVI Use one as directed for uncontrolled dm 1 each 0    Continuous Glucose Sensor (DEXCOM G7 SENSOR) MISC USE AS DIRECTED. CHANGE EVERY 10 DAYS. 3 each 0    Ensifentrine  (OHTUVAYRE ) 3 MG/2.5ML SUSP Inhale 3 mg into the lungs in the morning and at bedtime. (Patient not taking: Reported on 01/17/2024) 150 mL 11 Not Taking   famotidine  (PEPCID ) 20 MG tablet Take 1 tablet (20 mg total) by mouth daily for 14 days. (Patient taking differently: Take 20 mg by mouth daily as needed for heartburn.) 14 tablet 0    Insulin  Pen Needle (RELION PEN NEEDLES) 31G X 6 MM MISC USE 1 PEN NEEDLE WITH INSULIN  PEN ONCE DAILY FOR DIABETES 50 each 3    methylPREDNISolone  (MEDROL  DOSEPAK) 4 MG TBPK tablet       OXYGEN  Inhale 4 L into the lungs. PT USES [employer] FOR OXYGEN       Scheduled:   aspirin  EC  81 mg Oral Daily   atorvastatin   10 mg Oral Daily   calcium  carbonate  500 mg of elemental calcium  Oral BID WC   cholecalciferol   1,000 Units Oral q AM   escitalopram   10 mg Oral Daily   ferrous sulfate   325 mg Oral BID WC   fluticasone  furoate-vilanterol  1 puff Inhalation Daily   gabapentin   300 mg Oral TID   insulin  aspart  0-5 Units  Subcutaneous QHS   insulin  aspart  0-6 Units Subcutaneous TID WC   metoprolol  succinate  25 mg Oral Daily   pantoprazole  40 mg Oral Daily   potassium chloride   10 mEq Oral QODAY   potassium chloride   20-40 mEq Oral Once   umeclidinium bromide   1 puff Inhalation Daily   vitamin  B-12  100 mcg Oral Daily   Infusions:   sodium chloride  75 mL/hr at 01/20/24 2225    ceFAZolin  (ANCEF ) IV     heparin  800 Units/hr (01/20/24 1204)   PRN: acetaminophen  **OR** acetaminophen , ALPRAZolam , alum & mag hydroxide-simeth, cyclobenzaprine , diphenhydrAMINE , famotidine , guaiFENesin -dextromethorphan, hydrALAZINE , ipratropium-albuterol , labetalol , magnesium  hydroxide, metoprolol  tartrate, morphine  injection, ondansetron , oxyCODONE -acetaminophen , phenol, sorbitol Anti-infectives (From admission, onward)    Start     Dose/Rate Route Frequency Ordered Stop   01/21/24 0800  ceFAZolin  (ANCEF ) IVPB 2g/100 mL premix        2 g 200 mL/hr over 30 Minutes Intravenous 30 min pre-op 01/20/24 1950     01/17/24 0829  ceFAZolin  (ANCEF ) IVPB 2g/100 mL premix        2 g 200 mL/hr over 30 Minutes Intravenous 30 min pre-op 01/17/24 1610 01/17/24 2113       Assessment: 68 yr old female underwent LE angiography 6/2. 70% stenosis of the left common femoral artery with disease extending down into the proximal profunda femoris artery. No DOAC PTA. PMH: HTN, DM, IDA, PAD. No CBC will order a stat lab.   6/2 1805    HL 0.33, therapeutic x 1  650 units/hr 6/2 2145    Hepatin gtt held for 1 hr d/t bleeding at infusion site 6/3 0355    HL 0.14, SUBtherapeutic @ 550 units/hr 6/3 1150    HL 0.15, SUBtherapeutic @ 600 units/hr 6/3 1822    HL 0.23, SUBtherapeutic @ 650 units/hr 6/4 0207    HL 0.27, SUBtherapeutic @ 700 units/hr 6/4 0847    HL 0.30, barely Therapeutic ,  inc to 750 to 800 units/hr 6/4 1814    HL 0.38 6/5 0430    HL 0.36, therapeutic X 3  6/6 0431    HL 0.33, therapeutic X 4   Goal of Therapy:  Heparin  level  0.3-0.7 units/ml Monitor platelets by anticoagulation protocol: Yes   Plan:  6/6:  HL @ 0431 = 0.33, therapeutic X 4 - Will continue pt on current rate and recheck HL on 6/7 with AM labs.  - CBC daily   Arley Garant D Clinical Pharmacist 01/21/2024

## 2024-01-21 NOTE — Anesthesia Procedure Notes (Signed)
 Procedure Name: Intubation Date/Time: 01/21/2024 9:59 AM  Performed by: Lourena Royal, RNPre-anesthesia Checklist: Patient identified, Emergency Drugs available, Suction available and Patient being monitored Patient Re-evaluated:Patient Re-evaluated prior to induction Oxygen  Delivery Method: Circle system utilized Preoxygenation: Pre-oxygenation with 100% oxygen  Induction Type: IV induction Ventilation: Oral airway inserted - appropriate to patient size Laryngoscope Size: McGrath and 3 Grade View: Grade I Tube type: Oral Tube size: 7.0 mm Number of attempts: 1 Airway Equipment and Method: Stylet and Video-laryngoscopy Placement Confirmation: ETT inserted through vocal cords under direct vision, positive ETCO2 and breath sounds checked- equal and bilateral Secured at: 21 cm Tube secured with: Tape Dental Injury: Teeth and Oropharynx as per pre-operative assessment

## 2024-01-21 NOTE — Progress Notes (Signed)
 Patient urinated and c/o of blood in her urine. Patient denies any pain or burning when urinating. Author assessed client, no bleeding seen near perineum however there was bleeding in toilet. Author informed Dr. Vonna Guardian of episode of hematuria.

## 2024-01-21 NOTE — Anesthesia Preprocedure Evaluation (Addendum)
 Anesthesia Evaluation  Patient identified by MRN, date of birth, ID band Patient awake    Reviewed: Allergy & Precautions, NPO status , Patient's Chart, lab work & pertinent test results  History of Anesthesia Complications Negative for: history of anesthetic complications  Airway Mallampati: III   Neck ROM: Full    Dental  (+) Edentulous Upper, Edentulous Lower   Pulmonary asthma , sleep apnea , COPD (on 2-3L home O2), former smoker (quit 2011) Lung CA   Pulmonary exam normal breath sounds clear to auscultation       Cardiovascular hypertension, + Peripheral Vascular Disease and +CHF (preserved EF)  Normal cardiovascular exam+ pacemaker (SSS)  Rhythm:Regular Rate:Normal     Neuro/Psych negative neurological ROS     GI/Hepatic negative GI ROS,,,  Endo/Other  diabetes, Type 2    Renal/GU negative Renal ROS     Musculoskeletal  (+) Arthritis ,    Abdominal   Peds  Hematology  (+) Blood dyscrasia, anemia   Anesthesia Other Findings Cardiology note 01/20/24:  Angela Jensen is a 68 y.o. female  with a past medical history of chronic HFpEF, SSS s/p micra (06/2021), hypertension, hyperlipidemia, aortic atherosclerosis, IDDM, PVD, COPD/asthma (baseline 2L O2), OSA, tobacco use who presented for outpatient procedure on 01/17/2024 underwent lower extremity angiogram on 06/02 and found that patient will need a left femoral endarterectomy with illiac stents and SFA intervention. Cardiology was consulted for preoperative cardiac evaluation.   #Preop Cardiac Evaluation # PAD with Left common femoral artery stenosis # Hypertension # Hyperlipidemia # Chronic HFpEF # SSS s/p micra (06/2021) Patient with PAD with rest pain LLE underwent left lower extremity angiogram that demonstrated 70% stenosis of left common femoral artery on 06/02. Patient requiring left femoral endarterectomy with illiac stents and SFA intervention. Echo from  05/2023 with pEF and low risk. EKG from today (06/03) with sinus rhythm, RBBB is unchanged from prior EKG, no acute ischemic changes. BP improving -Recommend resuming home antihypertensive medications (lisinopril -hydrochlorothiazide ) if BP remains stabilizes.  -Continue home metoprolol  succinate 25 mg daily with hold parameters. Hold if SBP < 100. -Continue ASA 81 mg, atorvastatin  10 mg daily. -Recommend continuing breathing treatments for COPD.    Patient is at elevated but acceptable risk for proceeding with surgery given cardiac history understanding risk/benefits.  Can proceed with left femoral endarterectomy without further cardiac diagnostics needed.    This patient's plan of care was discussed and created with Dr. Bob Burn and he is in agreement.   Reproductive/Obstetrics                             Anesthesia Physical Anesthesia Plan  ASA: 3  Anesthesia Plan: General   Post-op Pain Management:    Induction: Intravenous  PONV Risk Score and Plan: 3 and Ondansetron , Dexamethasone  and Treatment may vary due to age or medical condition  Airway Management Planned: Oral ETT  Additional Equipment:   Intra-op Plan:   Post-operative Plan: Extubation in OR  Informed Consent: I have reviewed the patients History and Physical, chart, labs and discussed the procedure including the risks, benefits and alternatives for the proposed anesthesia with the patient or authorized representative who has indicated his/her understanding and acceptance.     Dental advisory given  Plan Discussed with: CRNA  Anesthesia Plan Comments: (Patient consented for risks of anesthesia including but not limited to:  - adverse reactions to medications - damage to eyes, teeth, lips or other oral mucosa -  nerve damage due to positioning  - sore throat or hoarseness - damage to heart, brain, nerves, lungs, other parts of body or loss of life  Informed patient about role of CRNA in  peri- and intra-operative care.  Patient voiced understanding.)        Anesthesia Quick Evaluation

## 2024-01-21 NOTE — Op Note (Signed)
 OPERATIVE NOTE   PROCEDURE: 1.   Left common femoral, profunda femoris, and superficial femoral artery endarterectomies and patch angioplasty 2.   Stent placement to the left external iliac artery with 8 mm diameter by 7.5 cm length Viabahn stent 3.   Mechanical thrombectomy to the left SFA and popliteal arteries with the 8 [place] Rex device 4.   Stent placement to proximal left SFA with 7 mm diameter by 10 cm length Viabahn stent 5.  Stent placement to the left most distal SFA and popliteal artery with 6 mm diameter by 15 cm length Viabahn stent 6.  Angioplasty of the left tibioperoneal trunk and posterior tibial arteries with 2.5 mm diameter angioplasty balloon proximally and then the entirety of the left posterior tibial artery with 2 mm diameter angioplasty: 7.  Mechanical thrombectomy of the left tibioperoneal trunk and posterior tibial arteries with the penumbra 6 bolt device 8.  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  significant plaque in left common femoral, profunda femoris, and superficial femoral arteries 2.  Thrombotic occlusion of the stents in the left SFA and popliteal arteries  3.   Thrombus creating occlusion in the left posterior tibial artery and tibioperoneal trunk  SPECIMEN(S):  Left common femoral, profunda femoris, and superficial femoral artery plaque.  INDICATIONS:    Patient presents with rest pain of the left lower extremity with thrombosis of her left SFA and popliteal stents as well as disease in the common femoral artery tracking down into the origin of the profunda femoris artery and disease in the distal  external iliac artery on recent angiogram.  Left femoral endarterectomy as well as left external iliac artery stent placement and infrainguinal intervention is planned to try to improve perfusion.  The risks and benefits as well as alternative therapies including intervention were reviewed in detail all questions were answered the patient agrees to proceed with surgery.  DESCRIPTION: After obtaining full informed written consent, the patient was brought back to the operating room and placed supine upon the operating table.  The patient received IV antibiotics prior to induction.  After obtaining adequate anesthesia, the patient was prepped and draped in the standard fashion appropriate time out is called.    Vertical incision was created overlying the left femoral arteries. The common femoral artery proximally, and superficial femoral artery, and primary profunda femoris artery branches were encircled with vessel loops and prepared for control. The left femoral arteries were found to have significant plaque from the common femoral artery into the profunda and superficial femoral arteries.   5000 units of heparin  was given and allowed circulate for 5 minutes.  Additional heparin  was given later in the procedure as needed  Attention is then turned to the left femoral artery.  An arteriotomy is made with 11 blade and extended with Potts scissors in the common femoral artery and carried down onto the first 2-3 cm of the left superficial femoral artery. An endarterectomy was then performed. The [facility] was used to create a plane. The proximal endpoint was cut flush with tenotomy scissors. This was in the proximal common femoral artery. An eversion endarterectomy was then performed at the origin of the  profunda femoris artery.  Good backbleeding was then seen. The distal endpoint of the superficial femoral endarterectomy was created with removal of the most proximal stent as well as transecting  portions of the life stent down to the arteriotomy.  A large chunk of calcific plaque was also removed from behind the stent.  The bovine pericardial patcth is then selected and prepared for a patch angioplasty.  It is cut and beveled and started at the proximal endpoint with a 6-0 Prolene suture.  Approximately one half of the suture line is run medially and laterally and the distal end point was cut and bevelled to match the arteriotomy.  A second 6-0 Prolene was started at the distal end point and run to the mid portion to complete the arteriotomy.  Interrupted sutures were placed on either side of the suture line laterally with a small gap left for the endovascular portion of the procedure to place an 8 [place] sheath.    We then placed an 8 French sheath in a retrograde fashion and the gap in the arteriotomy in the left common femoral artery.  Imaging showed the significant stenosis in the left external iliac artery proximal to what we could treat with the endarterectomy.  I then crossed this lesion without difficulty with a V18 wire.  An 8 mm diameter by 7.5 cm length Viabahn stent was then selected and deployed in the left external iliac artery and postdilated with a 6 mm balloon with excellent angiographic completion result and less than 10% residual stenosis.  The sheath was then removed from a retrograde fashion and placed in an antegrade fashion for the infrainguinal intervention.  There was a thrombotic occlusion of the left SFA and popliteal stents and this was easily crossed with an advantage wire and a Kumpe catheter getting down into the posterior tibial artery and exchanging for a V18 wire.  Extensive mechanical thrombectomy was then performed with the Rota Rex catheter throughout the SFA and popliteal arteries.  The 8 [place] Rex catheter was used.  Significant thrombus was removed, but there remained stenosis and possible thrombus just distal to the previously placed stents as well as greater  than 70% stenosis in the proximal SFA just below the patch angioplasty and now just above the previously placed stents.  The proximal SFA was addressed with a 7 mm diameter by 10 cm length Viabahn stent postdilated with a 6 mm balloon.  The popliteal lesion below the previously placed stents was addressed with a 6 mm diameter by 15 cm length Viabahn stent postdilated with a 4 mm balloon distally and a 6 mm balloon proximally.  Completion imaging showed the stents to now be patent, but there was occlusion of the tibioperoneal trunk and all 3 tibial vessels.  This was clearly thrombotic.  As the wire was down the posterior tibial artery and this was known to be continuous to the foot, we turned our attention to opening this.  We exchanged for a 0.014 wire and first performed angioplasty in the left tibioperoneal trunk and proximal posterior tibial artery with a 2.5 mm diameter by 15 cm length angioplasty balloon.  Thrombosis still remained although this did resolve the thrombus within the tibioperoneal trunk largely in the peroneal artery was now continuous although much smaller.  The penumbra CAT 6 bolt device was then brought onto the field and mechanical thrombectomy was performed from the left tibioperoneal trunk down through the entirety of the posterior tibial artery all the way into the  foot.  Many passes were made with this catheter and thrombus was removed.  There remained stenosis and thrombus in the mid to distal and distal posterior tibial artery and so I ballooned the vessel from the foot up to the proximal posterior tibial artery with a 2 mm diameter by 30 cm length angioplasty balloon inflated to 10 atm for 1 minute.  Completion imaging showed posterior tibial artery now to be open to the distal portion but there remained thrombus creating near occlusive stenosis at the foot and ankle.  Another pass was made with the penumbra 6 bolt device and now there was flow down to the ankle and significant tarsal  vessels into the foot and the peroneal artery was also open.  There was still some thrombus down this location so I elected to instill 4 mg of tPA directly into the clot in the posterior tibial artery at the foot and ankle.  This was done with a Kumpe catheter.  Now, with imaging from above there appeared to be two-vessel runoff distally in the peroneal and posterior tibial arteries.  Attention was then turned to the closure of the arteriotomy.  The 8 French sheath was removed. The vessel was flushed prior to release of control and completion of the anastomosis.  At this point, flow was established first to the profunda femoris artery and then to the superficial femoral artery. Easily palpable pulses are noted well beyond the anastomosis in both arteries.  Vashie irrigation and antibiotic beads impregnated with gentamicin and Vancomycin  were placed.  Fibrillar and Hemoblast topical hemostatic agents were placed in the femoral incision and hemostasis was complete. The femoral incision was then closed in a layered fashion with 2 layers of 2-0 Vicryl, 2 layers of 3-0 Vicryl, and staples for the skin closure. Sterile dressing were then placed over the incision.  The patient was then awakened from anesthesia and taken to the recovery room in stable condition having tolerated the procedure well.  COMPLICATIONS: None  CONDITION: Stable     Mikki Alexander 01/21/2024 2:49 PM   This note was created with Dragon Medical transcription system. Any errors in dictation are purely unintentional.

## 2024-01-21 NOTE — Op Note (Signed)
 OPERATIVE NOTE   PROCEDURE: Left common femoral, superficial femoral and profunda femoris endarterectomy with bovine pericardial patch angioplasty.    Stent placement to the left external iliac artery with 8 mm diameter by 7.5 cm length Viabahn stent    Mechanical thrombectomy to the left SFA and popliteal arteries with the 8 [place] Rex device   Stent placement to proximal left SFA with 7 mm diameter by 10 cm length Viabahn stent  Stent placement to the left most distal SFA and popliteal artery with 6 mm diameter by 15 cm length Viabahn stent   Angioplasty of the left tibioperoneal trunk and posterior tibial arteries with 2.5 mm diameter angioplasty balloon proximally and then the entirety of the left posterior tibial artery with 2 mm diameter angioplasty:   Mechanical thrombectomy of the left tibioperoneal trunk and posterior tibial arteries with the penumbra 6 bolt device  Instillation of 4 mg of tPA in the left posterior tibial artery distally for residual thrombus after above procedures  PRE-OPERATIVE DIAGNOSIS: Atherosclerotic occlusive disease left lower extremity with severe rest pain symptoms  POST-OPERATIVE DIAGNOSIS: Same  CO-SURGEON: Jackquelyn Mass, MD and [employer], M.D.  ASSISTANT(S): None  ANESTHESIA: general  ESTIMATED BLOOD LOSS: 400 cc  FINDING(S): Profound calcific plaque noted of the left common femoral extending past the initial bifurcation of the profunda femoris arteries as well as down the extensive length of the SFA. Occlusion of the previously placed left SFA stents Thrombus occluding the left posterior tibial artery and tibioperoneal trunk  SPECIMEN(S):  Calcific plaque from the common femoral, superficial femoral and the profunda femoris artery  INDICATIONS:   Angela Jensen 68 y.o. y.o.female who presents with complaints of lifestyle limiting claudication and pain continuously in the left lower  extremity. The patient has documented severe atherosclerotic occlusive disease and has undergone minimally invasive treatments in the past. However, at this point his primary area of stricture stenosis resides in the common femoral and origins of the superficial femoral and profunda femoris extending into these arteries and therefore this is not amenable to intervention. The patient is therefore undergoing open endarterectomy. The risks and benefits of surgery have been reviewed with the patient, all questions have answered; alternative therapies have been reviewed as well and the patient has agreed to proceed with surgical open repair.  DESCRIPTION: After obtaining full informed written consent, the patient was brought back to the operating room and placed supine upon the operating table.  The patient received IV antibiotics prior to induction.  After obtaining adequate anesthesia, the patient was prepped and draped in the standard fashion for left femoral exposure.  Co-surgeons are required because this is a complicated procedure with work being performed simultaneously by both surgeons.  This expedites the procedure making a shorter operative time reducing complications and improving patient safety.  Attention was turned to the left groin with Dr. Vonna Guardian working on the patient's left and myself working on the right of the patient.  Vertical  Incision was made over the left common femoral artery and dissection carried down to the common femoral artery with electrocautery.  I dissected out the common femoral artery from the distal external iliac artery (identified by the superficial circumflex vessels) down to the femoral bifurcation.  On initial inspection, the common femoral artery was: densely calcified and there was no palpable pulse noted.    Subsequently the dissection was continued to include all circumflex branches and the profunda femoral artery and superficial femoral artery. The  superficial femoral  artery was dissected circumferentially for a distance of approximately 3-4 cm and the profunda femoris was dissected circumferentially out to the fourth order branches individual vessel loops were placed around each branch.  Control of all branches was obtained with vessel loops.  A softer area in the distal external iliac artery amendable to clamping was identified.    The patient was given 6000 units of Heparin  intravenously (additional heparin  boluses were given throughout the case), which was a therapeutic bolus.   After waiting 3 minutes, the distal left external iliac artery was clamped and all of the vessel loops were placed under tension.  Arteriotomy was made in the common femoral artery with a 11-blade and extended it with a Potts scissor proximally and distally extending the distal end down the SFA for approximately 3 cm.   Endarterectomy was then performed under direct visualization using a freer elevator and a right angle from the mid common femoral extending up both proximally and distally. Proximally the endarterectomy was brought up to the level of the clamp where a clean edge was obtained. Distally the endarterectomy was carried down to a soft spot in the SFA where a feathered edge would was obtained.  7-0 Prolene interrupted tacking sutures were placed to secure the leading edge of the plaque in the SFA.  The profunda femoris was treated with an eversion technique extending endarterectomy approximately 1-2 cm distally again obtaining a featheredge on both sides right and left.   At this point, a bovine pericardial patch was fashioned for the geometry of the arteriotomy.  The pericardial patch was sewn to the artery with 2 running stitches of 6-0 Prolene, running from each end.  Prior to completing the patch angioplasty, the profunda femoral artery was flushed as was the superficial femoral artery. The system was then forward flushed. The endarterectomy site was then irrigated copiously with  heparinized saline. The patch angioplasty was completed in the usual fashion along the medial wall.  A small gap was left in the suture line along the lateral wall this Was secured with 2 interrupted 6-0 Prolene sutures to secure the suture line proximally and distally.  An 8 French sheath was then inserted through the And the retrograde direction and flow was then reestablished first to the profunda femoris and then the superficial femoral artery. Any gaps or bleeding sites in the suture line were easily controlled with a 6-0 Prolene suture.   At this point we began the interventional portion of the case with Dr. Vonna Guardian the primary and myself as a first assistant:  We then placed an 8 French sheath in a retrograde fashion and the gap in the arteriotomy in the left common femoral artery. Imaging showed the significant stenosis in the left external iliac artery proximal to what we could treat with the endarterectomy. I then crossed this lesion without difficulty with a V18 wire. An 8 mm diameter by 7.5 cm length Viabahn stent was then selected and deployed in the left external iliac artery and postdilated with a 6 mm balloon with excellent angiographic completion result and less than 10% residual stenosis. The sheath was then removed from a retrograde fashion and placed in an antegrade fashion for the infrainguinal intervention. There was a thrombotic occlusion of the left SFA and popliteal stents and this was easily crossed with an advantage wire and a Kumpe catheter getting down into the posterior tibial artery and exchanging for a V18 wire. Extensive mechanical thrombectomy was then performed with the  Rota Rex catheter throughout the SFA and popliteal arteries. The 8 [place] Rex catheter was used. Significant thrombus was removed, but there remained stenosis and possible thrombus just distal to the previously placed stents as well as greater than 70% stenosis in the proximal SFA just below the patch angioplasty  and now just above the previously placed stents. The proximal SFA was addressed with a 7 mm diameter by 10 cm length Viabahn stent postdilated with a 6 mm balloon. The popliteal lesion below the previously placed stents was addressed with a 6 mm diameter by 15 cm length Viabahn stent postdilated with a 4 mm balloon distally and a 6 mm balloon proximally. Completion imaging showed the stents to now be patent, but there was occlusion of the tibioperoneal trunk and all 3 tibial vessels. This was clearly thrombotic. As the wire was down the posterior tibial artery and this was known to be continuous to the foot, we turned our attention to opening this. We exchanged for a 0.014 wire and first performed angioplasty in the left tibioperoneal trunk and proximal posterior tibial artery with a 2.5 mm diameter by 15 cm length angioplasty balloon. Thrombosis still remained although this did resolve the thrombus within the tibioperoneal trunk largely in the peroneal artery was now continuous although much smaller. The penumbra CAT 6 bolt device was then brought onto the field and mechanical thrombectomy was performed from the left tibioperoneal trunk down through the entirety of the posterior tibial artery all the way into the foot. Many passes were made with this catheter and thrombus was removed. There remained stenosis and thrombus in the mid to distal and distal posterior tibial artery and so I ballooned the vessel from the foot up to the proximal posterior tibial artery with a 2 mm diameter by 30 cm length angioplasty balloon inflated to 10 atm for 1 minute. Completion imaging showed posterior tibial artery now to be open to the distal portion but there remained thrombus creating near occlusive stenosis at the foot and ankle. Another pass was made with the penumbra 6 bolt device and now there was flow down to the ankle and significant tarsal vessels into the foot and the peroneal artery was also open. There was still some  thrombus down this location so I elected to instill 4 mg of tPA directly into the clot in the posterior tibial artery at the foot and ankle. This was done with a Kumpe catheter. Now, with imaging from above there appeared to be two-vessel runoff distally in the peroneal and posterior tibial arteries.   The left groin was then irrigated copiously with Vashe and subsequently Heema blast and fibrillar were placed in the wound.  Additionally, antibiotic beads using vancomycin  and gentamicin were also placed in the bed of the wounds.  The incision was repaired with a double layer of 2-0 Vicryl, a double layer of 3-0 Vicryl, and staples were used to approximate the skin.  Prevena disposable VAC dressings were then applied to the groin.   COMPLICATIONS: None  CONDITION: Angela Jensen, M.D. [facility] Office: [phone]  01/21/2024, 2:51 PM

## 2024-01-21 NOTE — Plan of Care (Signed)
  Problem: Clinical Measurements: Goal: Ability to maintain clinical measurements within normal limits will improve Outcome: Progressing   Problem: Activity: Goal: Risk for activity intolerance will decrease Outcome: Progressing   Problem: Nutrition: Goal: Adequate nutrition will be maintained Outcome: Progressing   Problem: Coping: Goal: Level of anxiety will decrease Outcome: Progressing   Problem: Elimination: Goal: Will not experience complications related to bowel motility Outcome: Progressing Goal: Will not experience complications related to urinary retention Outcome: Progressing   Problem: Pain Managment: Goal: General experience of comfort will improve and/or be controlled Outcome: Progressing   Problem: Safety: Goal: Ability to remain free from injury will improve Outcome: Progressing   Problem: Skin Integrity: Goal: Risk for impaired skin integrity will decrease Outcome: Progressing   Problem: Coping: Goal: Ability to adjust to condition or change in health will improve Outcome: Progressing

## 2024-01-21 NOTE — Transfer of Care (Signed)
 Immediate Anesthesia Transfer of Care Note  Patient: Angela Jensen  Procedure(s) Performed: ENDARTERECTOMY, FEMORAL (Left) INSERTION, STENT, ARTERY, ILIAC (Left) APPLICATION OF CELL SAVER (Left)  Patient Location: PACU  Anesthesia Type:General  Level of Consciousness: drowsy  Airway & Oxygen  Therapy: Patient Spontanous Breathing and Patient connected to face mask oxygen   Post-op Assessment: Report given to RN, Post -op Vital signs reviewed and stable, and Patient moving all extremities X 4  Post vital signs: Reviewed and stable  Last Vitals:  Vitals Value Taken Time  BP 102/69 01/21/24 1453  Temp    Pulse 88 01/21/24 1456  Resp 15 01/21/24 1456  SpO2 100 % 01/21/24 1456  Vitals shown include unfiled device data.  Last Pain:  Vitals:   01/21/24 0838  TempSrc: Oral  PainSc: 0-No pain         Complications: No notable events documented.

## 2024-01-21 NOTE — Progress Notes (Signed)
 Order received for patient to be placed on a carb mod diet.

## 2024-01-22 LAB — CBC
HCT: 27.5 % — ABNORMAL LOW (ref 36.0–46.0)
Hemoglobin: 8.3 g/dL — ABNORMAL LOW (ref 12.0–15.0)
MCH: 28.6 pg (ref 26.0–34.0)
MCHC: 30.2 g/dL (ref 30.0–36.0)
MCV: 94.8 fL (ref 80.0–100.0)
Platelets: 207 10*3/uL (ref 150–400)
RBC: 2.9 MIL/uL — ABNORMAL LOW (ref 3.87–5.11)
RDW: 14 % (ref 11.5–15.5)
WBC: 12.8 10*3/uL — ABNORMAL HIGH (ref 4.0–10.5)
nRBC: 0 % (ref 0.0–0.2)

## 2024-01-22 LAB — GLUCOSE, CAPILLARY
Glucose-Capillary: 146 mg/dL — ABNORMAL HIGH (ref 70–99)
Glucose-Capillary: 195 mg/dL — ABNORMAL HIGH (ref 70–99)
Glucose-Capillary: 156 mg/dL — ABNORMAL HIGH (ref 70–99)
Glucose-Capillary: 118 mg/dL — ABNORMAL HIGH (ref 70–99)

## 2024-01-22 LAB — HEPARIN LEVEL (UNFRACTIONATED)
Heparin Unfractionated: 0.71 [IU]/mL — ABNORMAL HIGH (ref 0.30–0.70)
Heparin Unfractionated: 0.63 [IU]/mL (ref 0.30–0.70)
Heparin Unfractionated: 0.63 [IU]/mL (ref 0.30–0.70)

## 2024-01-22 LAB — BASIC METABOLIC PANEL WITH GFR
Anion gap: 6 (ref 5–15)
BUN: 18 mg/dL (ref 8–23)
CO2: 29 mmol/L (ref 22–32)
Calcium: 8.1 mg/dL — ABNORMAL LOW (ref 8.9–10.3)
Chloride: 100 mmol/L (ref 98–111)
Creatinine, Ser: 1.15 mg/dL — ABNORMAL HIGH (ref 0.44–1.00)
GFR, Estimated: 52 mL/min — ABNORMAL LOW (ref >60)
Glucose, Bld: 147 mg/dL — ABNORMAL HIGH (ref 70–99)
Potassium: 5.9 mmol/L — ABNORMAL HIGH (ref 3.5–5.1)
Sodium: 135 mmol/L (ref 135–145)

## 2024-01-22 MED ORDER — SODIUM CHLORIDE 0.9 % IV SOLN: Freq: Once | INTRAVENOUS | Status: AC

## 2024-01-22 MED ORDER — CHLORHEXIDINE GLUCONATE CLOTH 2 % EX PADS
6.0000 | MEDICATED_PAD | Freq: Every day | CUTANEOUS | Status: DC
  Administered 2024-01-22 – 2024-01-24 (×3): 6 via TOPICAL

## 2024-01-22 MED ORDER — SODIUM CHLORIDE 0.9 % IV SOLN: INTRAVENOUS | Status: AC

## 2024-01-22 NOTE — Anesthesia Postprocedure Evaluation (Signed)
 Anesthesia Post Note  Patient: Angela Jensen  Procedure(s) Performed: LEFT COMMON, SUPERFICIAL FEMORAL AND PROFUNDA FEMORIS ENDARTERECTOMY WITH BOVINE PERICARDIAL PATCH ANGIOPLASTY (Left) INSERTION, STENT, ARTERY, ILIAC (Left) APPLICATION OF CELL SAVER (Left)  Patient location during evaluation: SICU Anesthesia Type: General Level of consciousness: awake Pain management: pain level controlled Vital Signs Assessment: post-procedure vital signs reviewed and stable Respiratory status: spontaneous breathing and patient connected to nasal cannula oxygen  Cardiovascular status: tachycardic and stable Postop Assessment: no apparent nausea or vomiting Anesthetic complications: no Comments: Pt with tachycardia and hyperkalemia. She denies CP, SOB, or feeling ill. She is in no distress. She likely needs a fluid bolus. ICU nursing staff were made aware.    No notable events documented.   Last Vitals:  Vitals:   01/22/24 0730 01/22/24 0800  BP: (!) 88/58 (!) 94/58  Pulse: (!) 121 (!) 120  Resp: 14 14  Temp:    SpO2: 91% 94%    Last Pain:  Vitals:   01/22/24 0504  TempSrc:   PainSc: Asleep                 Baltazar Bonier

## 2024-01-22 NOTE — Progress Notes (Signed)
 Order received from provider to administer 1L of NS and initiate NS at 50mL/hr

## 2024-01-22 NOTE — Progress Notes (Signed)
 Patient's urine is of small amount and tea colored, B/P soft at 87/68 (75), provider notified.

## 2024-01-22 NOTE — Progress Notes (Signed)
 Patient's right and left leg noted to be extremely hot to touch, with the left hotter than the right, patient's bilateral dorsalis pedis pulses are palpable +1 to touch and skin color to both lower extremities is normal, cap refill <3 secs to right and left toes as well.  Access site is unchanged to left fem is unchanged.  Heart has been sustained in the 120s with no c/o.  Provider notified, will continue to monitor patient.

## 2024-01-22 NOTE — Evaluation (Signed)
 Physical Therapy Evaluation Patient Details Name: Angela Jensen MRN: [id number] DOB: August 10, 1956 Today's Date: 01/22/2024  History of Present Illness  Pt is a 68 y/o F admitted on 01/17/24 for LLE angiography. Pt is s/p LLE femoral endarterectomy with iliac stent placement on 01/21/24. PMH: asthma, lung CA, COPD, depression, DM, HTN, hypercholesterolemia, O2 dependent at night, PAD, Sialolithiasis, sleep apnea  Clinical Impression  Pt seen for PT evaluation with pt agreeable to tx, daughter present in room.  Pt reports prior to admission she was primarily independent without AD, caring for 62 month old great grandchild during the day, only used rollator in the kitchen to sit while cooking. On this date, pt requires min assist for bed mobility with hospital bed features, sit<>stand from EOB with CGA<>min assist & stand pivot bed>recliner on L with RW & CGA<>min assist. Pt does endorse pain in LLE with weightbearing. Pt would benefit from ongoing PT services to progress mobility as able. Further mobility deferred 2/2 nursing request 2/2 pt's elevated HR on this date.      If plan is discharge home, recommend the following: A little help with walking and/or transfers;A little help with bathing/dressing/bathroom;Assistance with cooking/housework;Assist for transportation;Help with stairs or ramp for entrance   Can travel by private vehicle        Equipment Recommendations None recommended by PT  Recommendations for Other Services  OT consult    Functional Status Assessment       Precautions / Restrictions Precautions Precautions: Fall Precaution/Restrictions Comments: watch HR, O2 Restrictions Weight Bearing Restrictions Per Provider Order: No      Mobility  Bed Mobility Overal bed mobility: Needs Assistance Bed Mobility: Supine to Sit     Supine to sit: Min assist, HOB elevated, Used rails     General bed mobility comments: extra time    Transfers Overall transfer level: Needs  assistance Equipment used: Rolling walker (2 wheels) Transfers: Sit to/from Stand, Bed to chair/wheelchair/BSC Sit to Stand: Min assist, Contact guard assist   Step pivot transfers: Min assist, Contact guard assist (bed>recliner on L with RW)       General transfer comment: sit<>stand from elevated EOB    Ambulation/Gait                  Stairs            Wheelchair Mobility     Tilt Bed    Modified Rankin (Stroke Patients Only)       Balance Overall balance assessment: Needs assistance Sitting-balance support: Feet supported, Feet unsupported Sitting balance-Leahy Scale: Fair     Standing balance support: During functional activity, Bilateral upper extremity supported, Reliant on assistive device for balance Standing balance-Leahy Scale: Fair                               Pertinent Vitals/Pain Pain Assessment Pain Assessment: 0-10 Pain Score: 6  Pain Location: LLE with weight bearing, gait Pain Descriptors / Indicators: Discomfort Pain Intervention(s): Monitored during session, Limited activity within patient's tolerance, Repositioned    Home Living Family/patient expects to be discharged to:: Private residence Living Arrangements: Spouse/significant other Available Help at Discharge: Family;Available 24 hours/day Type of Home: House Home Access: Stairs to enter Entrance Stairs-Rails: None Entrance Stairs-Number of Steps: 2   Home Layout: Laundry or work area in basement;One level Home Equipment: Agricultural consultant (2 wheels);Cane - single Higher education careers adviser (4 wheels) Additional Comments: On 3L/min O2  at all times.    Prior Function               Mobility Comments: PRN use of rollator, otherwise walks without any AD (primarily uses the rollator while cooking, so she can sit). Denies falls in the past 6 months. Cares for 3 month old great grandchild during the day. ADLs Comments: Independent with bathing  & dressing.     Extremity/Trunk Assessment   Upper Extremity Assessment Upper Extremity Assessment: Overall WFL for tasks assessed    Lower Extremity Assessment Lower Extremity Assessment: Generalized weakness       Communication   Communication Communication: No apparent difficulties    Cognition Arousal: Alert Behavior During Therapy: WFL for tasks assessed/performed   PT - Cognitive impairments: No apparent impairments                         Following commands: Intact       Cueing Cueing Techniques: Verbal cues     General Comments General comments (skin integrity, edema, etc.): Pt on 3L/min via nasal cannula, SPO2 drops as low as 84% with transfer but question accuracy as waveform not always good. Max HR 133 bpm. Pt does endorse lightheadedness with transitional movements with slight drop in BP but still 90s/60s.    Exercises     Assessment/Plan    PT Assessment Patient needs continued PT services  PT Problem List Decreased strength;Cardiopulmonary status limiting activity;Pain;Decreased range of motion;Decreased activity tolerance;Decreased balance;Decreased mobility;Decreased knowledge of use of DME       PT Treatment Interventions DME instruction;Balance training;Modalities;Gait training;Neuromuscular re-education;Cognitive remediation;Stair training;Functional mobility training;Therapeutic activities;Therapeutic exercise;Patient/family education    PT Goals (Current goals can be found in the Care Plan section)  Acute Rehab PT Goals Patient Stated Goal: decreased pain, get better PT Goal Formulation: With patient Time For Goal Achievement: 02/05/24 Potential to Achieve Goals: Good    Frequency Min 2X/week     Co-evaluation               AM-PAC PT "6 Clicks" Mobility  Outcome Measure Help needed turning from your back to your side while in a flat bed without using bedrails?: A Little Help needed moving from lying on your back to  sitting on the side of a flat bed without using bedrails?: A Lot Help needed moving to and from a bed to a chair (including a wheelchair)?: A Little Help needed standing up from a chair using your arms (e.g., wheelchair or bedside chair)?: A Little Help needed to walk in hospital room?: A Little Help needed climbing 3-5 steps with a railing? : A Lot 6 Click Score: 16    End of Session Equipment Utilized During Treatment: Oxygen  Activity Tolerance: Patient tolerated treatment well;Treatment limited secondary to medical complications (Comment) (elevated HR) Patient left: in chair;with call bell/phone within reach;with family/visitor present Nurse Communication: Mobility status (O2) PT Visit Diagnosis: Pain;Muscle weakness (generalized) (M62.81);Difficulty in walking, not elsewhere classified (R26.2) Pain - Right/Left: Left Pain - part of body: Leg    Time: 7425-9563 PT Time Calculation (min) (ACUTE ONLY): 18 min   Charges:   PT Evaluation $PT Eval Moderate Complexity: 1 Mod   PT General Charges $$ ACUTE PT VISIT: 1 Visit         Emaline Handsome, PT, DPT 01/22/24, 10:32 AM   Venetta Gill 01/22/2024, 10:30 AM

## 2024-01-22 NOTE — Consult Note (Signed)
 PHARMACY - ANTICOAGULATION CONSULT NOTE  Pharmacy Consult for IV Hpearin  Indication: lower extremity ischemia   Patient Measurements: Height: 4\' 11"  (149.9 cm) Weight: 54.4 kg (120 lb) IBW/kg (Calculated) : 43.2 HEPARIN  DW (KG): 54.1  Labs: Recent Labs    01/20/24 0430 01/21/24 0431 01/22/24 0339 01/22/24 1121  HGB 10.2* 9.4* 8.3*  --   HCT 33.6* 30.8* 27.5*  --   PLT 213 200 207  --   HEPARINUNFRC 0.36 0.33 0.71* 0.63  CREATININE 0.76  --  1.15*  --    Estimated Creatinine Clearance: 35.3 mL/min (A) (by C-G formula based on SCr of 1.15 mg/dL (H)).  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/2016   Cancer (HCC) 2012   Right Lung CA   COPD (chronic obstructive pulmonary disease) (HCC)    Depression    Diabetes mellitus without complication (HCC)    Patient takes Janumet   Essential hypertension 05/26/2016   Heart failure (HCC) 2022   Hydropneumothorax 05/03/2020   Hypercholesteremia    Oxygen  dependent    2L at nite    PAD (peripheral artery disease) (HCC) 06/22/2016   Peripheral vascular disease (HCC)    Personal history of radiation therapy    Shortness of breath dyspnea    with exertion    Sialolithiasis    Sleep apnea    Wears dentures    full upper and lower   Assessment: 68 yr old female underwent LE angiography 6/2. 70% stenosis of the left common femoral artery with disease extending down into the proximal profunda femoris artery. No DOAC PTA. PMH: HTN, DM, IDA, PAD. No CBC will order a stat lab.   6/2 1805    HL 0.33, therapeutic x 1  650 units/hr 6/2 2145    Hepatin gtt held for 1 hr d/t bleeding at infusion site 6/3 0355    HL 0.14, SUBtherapeutic @ 550 units/hr 6/3 1150    HL 0.15, SUBtherapeutic @ 600 units/hr 6/3 1822    HL 0.23, SUBtherapeutic @ 650 units/hr 6/4 0207    HL 0.27, SUBtherapeutic @ 700  units/hr 6/4 0847    HL 0.30, barely Therapeutic ,  inc to 750 to 800 units/hr 6/4 1814    HL 0.38 6/5 0430    HL 0.36, therapeutic x 3  6/6 0431    HL 0.33, therapeutic x 4  6/7 0339    HL 0.71, slightly elevated 6/7 1121    HL 0.63, therapeutic x 1 6/7 1845    HL 0.63, therapeutic x 2  Goal of Therapy:  Heparin  level 0.3-0.7 units/ml Monitor platelets by anticoagulation protocol: Yes   Plan:  Heparin  level is therapeutic x 2 -- Continue heparin  infusion at 750 un/hr -- Check HL with AM labs daily while therapeutic  -- Daily CBC per protocol while on IV heparin   Thank you for allowing pharmacy to participate in this patient's care.  Craven Do, PharmD Pharmacy Resident  01/22/2024 7:23 PM

## 2024-01-22 NOTE — Progress Notes (Signed)
 Subjective  - POD # 1, status post left femoral endarterectomy with patch angioplasty, left external iliac stent and left SFA stent with angioplasty of the tibioperoneal trunk and posterior tibial artery  Says her leg feels better   Physical Exam:  Groin dressing is clean dry and intact.  No hematoma She has Doppler signals in the dorsalis pedis and posterior tibial artery. No evidence of compartment syndrome       Assessment/Plan:  POD # 1  -Encouraged ambulation today with nursing staff and physical therapy.  I want her to be walking around in the ICU - Currently on aspirin  and heparin  - Acute blood loss anemia: Hemoglobin is 8.3 - Continue statin therapy - Okay to transfer to floor  Angela Jensen 01/22/2024 12:12 PM --  Vitals:   01/22/24 1130 01/22/24 1200  BP: 108/76 105/60  Pulse: 98 99  Resp: 12 12  Temp:    SpO2: 100% 99%    Intake/Output Summary (Last 24 hours) at 01/22/2024 1212 Last data filed at 01/22/2024 1200 Gross per 24 hour  Intake 2621.68 ml  Output 1850 ml  Net 771.68 ml     Laboratory CBC    Component Value Date/Time   WBC 12.8 (H) 01/22/2024 0339   HGB 8.3 (L) 01/22/2024 0339   HGB 12.7 10/08/2023 1004   HGB 11.6 10/04/2023 1047   HCT 27.5 (L) 01/22/2024 0339   HCT 37.6 10/04/2023 1047   PLT 207 01/22/2024 0339   PLT 264 10/08/2023 1004   PLT 236 10/04/2023 1047    BMET    Component Value Date/Time   NA 135 01/22/2024 0339   NA 144 10/04/2023 1047   NA 141 04/17/2014 1055   K 5.9 (H) 01/22/2024 0339   K 3.9 04/17/2014 1055   CL 100 01/22/2024 0339   CL 102 04/17/2014 1055   CO2 29 01/22/2024 0339   CO2 30 04/17/2014 1055   GLUCOSE 147 (H) 01/22/2024 0339   GLUCOSE 201 (H) 04/17/2014 1055   BUN 18 01/22/2024 0339   BUN 12 10/04/2023 1047   BUN 10 04/17/2014 1055   CREATININE 1.15 (H) 01/22/2024 0339   CREATININE 0.84 07/06/2023 1016   CREATININE 0.90 04/17/2014 1055   CALCIUM  8.1 (L) 01/22/2024 0339   CALCIUM  9.1  04/17/2014 1055   GFRNONAA 52 (L) 01/22/2024 0339   GFRNONAA >60 07/06/2023 1016   GFRNONAA >60 04/17/2014 1055   GFRAA >60 05/05/2020 0558   GFRAA >60 04/17/2014 1055    COAG Lab Results  Component Value Date   INR 1.1 01/17/2024   INR 1.0 04/24/2020   INR 0.9 04/04/2020   No results found for: "PTT"  Antibiotics Anti-infectives (From admission, onward)    Start     Dose/Rate Route Frequency Ordered Stop   01/21/24 1138  gentamicin  (GARAMYCIN ) injection  Status:  Discontinued          As needed 01/21/24 1138 01/21/24 1450   01/21/24 1137  vancomycin  (VANCOCIN ) powder  Status:  Discontinued          As needed 01/21/24 1137 01/21/24 1450   01/21/24 0800  ceFAZolin  (ANCEF ) IVPB 2g/100 mL premix        2 g 200 mL/hr over 30 Minutes Intravenous 30 min pre-op 01/20/24 1950 01/21/24 1031   01/17/24 0829  ceFAZolin  (ANCEF ) IVPB 2g/100 mL premix        2 g 200 mL/hr over 30 Minutes Intravenous 30 min pre-op 01/17/24 0829 01/17/24 2113  Aurea Leek, M.D., [facility] Office: [phone] Pager:  [phone]

## 2024-01-22 NOTE — Plan of Care (Signed)
 Continuing with plan of care.

## 2024-01-22 NOTE — Progress Notes (Signed)
 Occupational Therapy Evaluation Patient Details Name: Angela Jensen MRN: [id number] DOB: [birth year] Today's Date: 01/22/2024   History of Present Illness   Pt is a 68 y/o F admitted on 01/17/24 for LLE angiography. Pt is s/p LLE femoral endarterectomy with iliac stent placement on 01/21/24. PMH: asthma, lung CA, COPD, depression, DM, HTN, hypercholesterolemia, O2 dependent at night, PAD, Sialolithiasis, sleep apnea     Clinical Impressions Pt was seen for OT evaluation this date. Prior to hospital admission, pt was indep/ModI, amb with rollator PRN. Pt lives with her spouse in one level home, reports using oxygen  at night. Pt presents to acute OT demonstrating impaired ADL performance and functional mobility 2/2 (See OT problem list for additional functional deficits). Pt currently requires CGA for STS with RW from recliner with verbal cues for safety due with line/leads management. Pt was able to adjust her socks using a figure 4 method with set up assist prior to amb. Pt step pivoted recliner <> bed post eating lunch in the recliner and falling asleep. Pt was very lethargic during session, eager to return to bed. Will continue to assess functional mobility and ADL performance on next available date. Pt would benefit from skilled OT services to address noted impairments and functional limitations (see below for any additional details) in order to maximize safety and independence while minimizing falls risk and caregiver burden. OT will follow acutely.   If plan is discharge home, recommend the following:   A little help with walking and/or transfers;A little help with bathing/dressing/bathroom     Functional Status Assessment   Patient has had a recent decline in their functional status and demonstrates the ability to make significant improvements in function in a reasonable and predictable amount of time.     Equipment Recommendations   None recommended by OT     Recommendations for Other  Services         Precautions/Restrictions   Precautions Precautions: Fall Recall of Precautions/Restrictions: Intact Precaution/Restrictions Comments: watch HR, O2 Restrictions Weight Bearing Restrictions Per Provider Order: No     Mobility Bed Mobility Overal bed mobility: Needs Assistance Bed Mobility: Sit to Supine     Supine to sit: Contact guard     General bed mobility comments: no physical assistance to complete    Transfers Overall transfer level: Needs assistance Equipment used: Rolling walker (2 wheels) Transfers: Sit to/from Stand Sit to Stand: Contact guard assist     Step pivot transfers: Contact guard assist     General transfer comment: Step pivot to return to bed from recliener; CGA throughout with RW      Balance Overall balance assessment: Needs assistance Sitting-balance support: Feet supported, Feet unsupported Sitting balance-Leahy Scale: Fair Sitting balance - Comments: Steady reaching with BOS   Standing balance support: During functional activity, Bilateral upper extremity supported, Reliant on assistive device for balance Standing balance-Leahy Scale: Fair                             ADL either performed or assessed with clinical judgement   ADL Overall ADL's : Needs assistance/impaired Eating/Feeding: Sitting;Set up   Grooming: Wash/dry face;Sitting;Set up               Lower Body Dressing: Set up (Sitting in recliner, adjusted socks via figure 4)   Toilet Transfer: Rolling walker (2 wheels);Ambulation;Cueing for safety;Contact guard assist (Simulated toilet transfer from recliner)  Functional mobility during ADLs: Contact guard assist;Rolling walker (2 wheels) General ADL Comments: CGA amb with RW, cues for safety due to lines/leads     Vision         Perception         Praxis         Pertinent Vitals/Pain Pain Assessment Pain Assessment: No/denies pain     Extremity/Trunk  Assessment Upper Extremity Assessment Upper Extremity Assessment: Overall WFL for tasks assessed   Lower Extremity Assessment Lower Extremity Assessment: Defer to PT evaluation;Generalized weakness   Cervical / Trunk Assessment Cervical / Trunk Assessment: Normal   Communication Communication Communication: No apparent difficulties Factors Affecting Communication: Hearing impaired   Cognition Arousal: Lethargic Behavior During Therapy: WFL for tasks assessed/performed Cognition: No family/caregiver present to determine baseline             OT - Cognition Comments: Pt very lethargic throughout session                 Following commands: Intact       Cueing  General Comments   Cueing Techniques: Verbal cues  Pt on 3L via [place] during session spo2 levels <90%, HR elevates to 101bpm max during mobility   Exercises Exercises: Other exercises Other Exercises Other Exercises: Edu: Role of OT, safe transfer technique with handplacement cues   Shoulder Instructions      Home Living Family/patient expects to be discharged to:: Private residence Living Arrangements: Spouse/significant other Available Help at Discharge: Family;Available 24 hours/day Type of Home: House Home Access: Stairs to enter [employer] of Steps: 2 Entrance Stairs-Rails: None Home Layout: Laundry or work area in basement;One level     Bathroom Shower/Tub: Chief Strategy Officer: Standard     Home Equipment: Agricultural consultant (2 wheels);Cane - single Higher education careers adviser (4 wheels)   Additional Comments: On 3L/min O2 at all times.      Prior Functioning/Environment Prior Level of Function : Independent/Modified Independent             Mobility Comments: PRN use of rollator, otherwise walks without any AD (primarily uses the rollator while cooking, so she can sit). Denies falls in the past 6 months. Cares for 22 month old great grandchild  during the day. ADLs Comments: Independent with bathing & dressing.    OT Problem List: Decreased strength;Decreased activity tolerance;Impaired balance (sitting and/or standing);Decreased safety awareness;Decreased knowledge of use of DME or AE   OT Treatment/Interventions: Self-care/ADL training;Therapeutic exercise;Energy conservation;DME and/or AE instruction;Therapeutic activities;Patient/family education;Balance training      OT Goals(Current goals can be found in the care plan section)   Acute Rehab OT Goals Patient Stated Goal: Return home OT Goal Formulation: With patient Time For Goal Achievement: 02/05/24 Potential to Achieve Goals: Good ADL Goals Pt Will Perform Grooming: with supervision;standing Pt Will Perform Lower Body Dressing: with supervision;sit to/from stand Pt Will Transfer to Toilet: with supervision;ambulating;regular height toilet Pt Will Perform Toileting - Clothing Manipulation and hygiene: with supervision   OT Frequency:  Min 2X/week    Co-evaluation              AM-PAC OT "6 Clicks" Daily Activity     Outcome Measure Help from another person eating meals?: A Little Help from another person taking care of personal grooming?: A Lot Help from another person toileting, which includes using toliet, bedpan, or urinal?: A Lot Help from another person bathing (including washing, rinsing, drying)?: A Lot Help from another person to  put on and taking off regular upper body clothing?: A Little Help from another person to put on and taking off regular lower body clothing?: None 6 Click Score: 16   End of Session Equipment Utilized During Treatment: Gait belt;Rolling walker (2 wheels) Nurse Communication: Mobility status  Activity Tolerance: Patient tolerated treatment well Patient left: in bed;with call bell/phone within reach;with bed alarm set  OT Visit Diagnosis: Unsteadiness on feet (R26.81);Other abnormalities of gait and mobility (R26.89)                 Time: 1610-9604 OT Time Calculation (min): 13 min Charges:  OT General Charges $OT Visit: 1 Visit OT Evaluation $OT Eval Moderate Complexity: 1 Mod  Lamarion Mcevers M.S. OTR/L  01/22/24, 2:36 PM

## 2024-01-22 NOTE — Consult Note (Signed)
 PHARMACY - ANTICOAGULATION CONSULT NOTE  Pharmacy Consult for IV Hpearin  Indication: lower extremity ischemia   Patient Measurements: Height: 4\' 11"  (149.9 cm) Weight: 54.4 kg (120 lb) IBW/kg (Calculated) : 43.2 HEPARIN  DW (KG): 54.1  Labs: Recent Labs    01/20/24 0430 01/21/24 0431 01/22/24 0339 01/22/24 1121  HGB 10.2* 9.4* 8.3*  --   HCT 33.6* 30.8* 27.5*  --   PLT 213 200 207  --   HEPARINUNFRC 0.36 0.33 0.71* 0.63  CREATININE 0.76  --  1.15*  --     Estimated Creatinine Clearance: 35.3 mL/min (A) (by C-G formula based on SCr of 1.15 mg/dL (H)).   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/2016   Cancer (HCC) 2012   Right Lung CA   COPD (chronic obstructive pulmonary disease) (HCC)    Depression    Diabetes mellitus without complication (HCC)    Patient takes Janumet   Essential hypertension 05/26/2016   Heart failure (HCC) 2022   Hydropneumothorax 05/03/2020   Hypercholesteremia    Oxygen  dependent    2L at nite    PAD (peripheral artery disease) (HCC) 06/22/2016   Peripheral vascular disease (HCC)    Personal history of radiation therapy    Shortness of breath dyspnea    with exertion    Sialolithiasis    Sleep apnea    Wears dentures    full upper and lower   Assessment: 68 yr old female underwent LE angiography 6/2. 70% stenosis of the left common femoral artery with disease extending down into the proximal profunda femoris artery. No DOAC PTA. PMH: HTN, DM, IDA, PAD. No CBC will order a stat lab.   6/2 1805    HL 0.33, therapeutic x 1  650 units/hr 6/2 2145    Hepatin gtt held for 1 hr d/t bleeding at infusion site 6/3 0355    HL 0.14, SUBtherapeutic @ 550 units/hr 6/3 1150    HL 0.15, SUBtherapeutic @ 600 units/hr 6/3 1822    HL 0.23, SUBtherapeutic @ 650 units/hr 6/4 0207    HL 0.27, SUBtherapeutic @ 700  units/hr 6/4 0847    HL 0.30, barely Therapeutic ,  inc to 750 to 800 units/hr 6/4 1814    HL 0.38 6/5 0430    HL 0.36, therapeutic x 3  6/6 0431    HL 0.33, therapeutic x 4  6/7 0339    HL 0.71, slightly elevated 6/7 1121    HL 0.63, therapeutic x 1  Goal of Therapy:  Heparin  level 0.3-0.7 units/ml Monitor platelets by anticoagulation protocol: Yes   Plan:  --Heparin  level is therapeutic x 1 --Continue heparin  infusion at 750 un/hr --Re-check confirmatory HL in 6 hours --Daily CBC per protocol while on IV heparin   Page Boast 01/22/2024

## 2024-01-22 NOTE — Progress Notes (Signed)
 Provider notified of low urine output.

## 2024-01-22 NOTE — Consult Note (Signed)
 PHARMACY - ANTICOAGULATION CONSULT NOTE  Pharmacy Consult for heparin   Indication: lower extremity ischemia   Allergies  Allergen Reactions   Iodinated Contrast Media Itching   Trelegy Ellipta  [Fluticasone -Umeclidin-Vilant]     Had breathing issues    Patient Measurements: Height: 4\' 11"  (149.9 cm) Weight: 54.4 kg (120 lb) IBW/kg (Calculated) : 43.2 HEPARIN  DW (KG): 54.1  Vital Signs: Temp: 98.2 F (36.8 C) (06/07 0000) Temp Source: Oral (06/07 0000) BP: 105/76 (06/07 0421) Pulse Rate: 120 (06/07 0421)  Labs: Recent Labs    01/20/24 0430 01/21/24 0431 01/22/24 0339  HGB 10.2* 9.4* 8.3*  HCT 33.6* 30.8* 27.5*  PLT 213 200 207  HEPARINUNFRC 0.36 0.33 0.71*  CREATININE 0.76  --  1.15*    Estimated Creatinine Clearance: 35.3 mL/min (A) (by C-G formula based on SCr of 1.15 mg/dL (H)).   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/2016   Cancer (HCC) 2012   Right Lung CA   COPD (chronic obstructive pulmonary disease) (HCC)    Depression    Diabetes mellitus without complication (HCC)    Patient takes Janumet   Essential hypertension 05/26/2016   Heart failure (HCC) 2022   Hydropneumothorax 05/03/2020   Hypercholesteremia    Oxygen  dependent    2L at nite    PAD (peripheral artery disease) (HCC) 06/22/2016   Peripheral vascular disease (HCC)    Personal history of radiation therapy    Shortness of breath dyspnea    with exertion    Sialolithiasis    Sleep apnea    Wears dentures    full upper and lower    Medications:  Medications Prior to Admission  Medication Sig Dispense Refill Last Dose/Taking   albuterol  (VENTOLIN  HFA) 108 (90 Base) MCG/ACT inhaler INHALE 2 PUFFS BY MOUTH EVERY 6 HOURS AS NEEDED FOR WHEEZING OR SHORTNESS OF BREATH 3 each 5 01/17/2024 Morning   ALPRAZolam  (XANAX ) 0.5 MG tablet Take 1 tablet (0.5  mg total) by mouth 2 (two) times daily as needed for anxiety. 60 tablet 2 01/17/2024 Morning   aspirin  EC 81 MG tablet Take 1 tablet (81 mg total) by mouth daily. Swallow whole. 150 tablet 2 01/17/2024 Morning   atorvastatin  (LIPITOR) 10 MG tablet Take 1 tablet (10 mg total) by mouth daily. 30 tablet 11 01/17/2024 Morning   calcium  carbonate (OSCAL) 1500 (600 Ca) MG TABS tablet Take 600 mg of elemental calcium  by mouth 2 (two) times daily with a meal.   01/17/2024 Morning   clopidogrel  (PLAVIX ) 75 MG tablet Take 1 tablet (75 mg total) by mouth daily. 90 tablet 3 01/17/2024 Morning   cyclobenzaprine  (FLEXERIL ) 10 MG tablet Take 1 tablet (10 mg total) by mouth at bedtime. Take one tab po qhs for back spasm prn only 30 tablet 3 Past Month   escitalopram  (LEXAPRO ) 10 MG tablet Take 1 tablet (10 mg total) by mouth daily. 90 tablet 1 01/17/2024 Morning   ferrous sulfate  325 (65 FE) MG tablet Take 325 mg by mouth 2 (two) times daily with a meal.   01/17/2024 Morning   furosemide  (LASIX ) 20 MG tablet Take 1 tablet (20 mg total) by mouth daily as needed. 30 tablet 5 01/16/2024   gabapentin  (NEURONTIN ) 300 MG capsule TAKE 1 CAPSULE BY MOUTH THREE TIMES DAILY 90 capsule 0 01/17/2024 Morning   insulin  glargine, 1 Unit Dial , (TOUJEO )  300 UNIT/ML Solostar Pen Inject 10 Units into the skin daily. 1.5 mL 11 01/17/2024 Morning   ipratropium-albuterol  (DUONEB) 0.5-2.5 (3) MG/3ML SOLN USE 1 AMPULE IN NEBULIZER EVERY 4 HOURS AS NEEDED 360 mL 0 01/17/2024 Morning   lisinopril -hydrochlorothiazide  (ZESTORETIC ) 20-25 MG tablet Take 1 tablet by mouth daily. 90 tablet 3 01/17/2024 Morning   metoprolol  succinate (TOPROL -XL) 25 MG 24 hr tablet Take 1 tablet (25 mg total) by mouth daily. 90 tablet 3 01/17/2024 Morning   mometasone -formoterol  (DULERA ) 200-5 MCG/ACT AERO Inhale 2 puffs by mouth twice daily 13 g 12 01/17/2024 Morning   potassium chloride  (KLOR-CON ) 10 MEQ tablet TAKE 1 TABLET BY MOUTH EVERY OTHER DAY 45 tablet 0 01/16/2024   SPIRIVA  HANDIHALER  18 MCG inhalation capsule INHALE THE CONTENTS OF 1 CAPSULES BY MOUTH ONCE DAILY - DO NOT SWALLOW CAPSULES 90 capsule 3 01/17/2024 Morning   vitamin B-12 (CYANOCOBALAMIN ) 100 MCG tablet Take 100 mcg by mouth daily.   01/17/2024 Morning   VITAMIN D , CHOLECALCIFEROL , PO Take 1 tablet by mouth in the morning.   01/17/2024 Morning   ACCU-CHEK GUIDE TEST test strip USE TO TEST BLOOD SUGAR 5 TIMES DAILY 450 each 0    Accu-Chek Softclix Lancets lancets Use 1 lancet 3 times daily to check glucose for diabetes 300 each 1    Blood Glucose Monitoring Suppl (ACCU-CHEK GUIDE) w/Device KIT Use as directed Dx e11.65 1 kit 0    Continuous Blood Gluc Receiver (DEXCOM G7 RECEIVER) DEVI Use one as directed for uncontrolled dm 1 each 0    Continuous Glucose Sensor (DEXCOM G7 SENSOR) MISC USE AS DIRECTED. CHANGE EVERY 10 DAYS. 3 each 0    Ensifentrine  (OHTUVAYRE ) 3 MG/2.5ML SUSP Inhale 3 mg into the lungs in the morning and at bedtime. (Patient not taking: Reported on 01/17/2024) 150 mL 11 Not Taking   famotidine  (PEPCID ) 20 MG tablet Take 1 tablet (20 mg total) by mouth daily for 14 days. (Patient taking differently: Take 20 mg by mouth daily as needed for heartburn.) 14 tablet 0    Insulin  Pen Needle (RELION PEN NEEDLES) 31G X 6 MM MISC USE 1 PEN NEEDLE WITH INSULIN  PEN ONCE DAILY FOR DIABETES 50 each 3    methylPREDNISolone  (MEDROL  DOSEPAK) 4 MG TBPK tablet       OXYGEN  Inhale 4 L into the lungs. PT USES [facility] FOR OXYGEN       Scheduled:   aspirin  EC  81 mg Oral Daily   atorvastatin   10 mg Oral Daily   calcium  carbonate  500 mg of elemental calcium  Oral BID WC   Chlorhexidine  Gluconate Cloth  6 each Topical Q0600   cholecalciferol   1,000 Units Oral q AM   escitalopram   10 mg Oral Daily   ferrous sulfate   325 mg Oral BID WC   fluticasone  furoate-vilanterol  1 puff Inhalation Daily   gabapentin   300 mg Oral TID   insulin  aspart  0-5 Units Subcutaneous QHS   insulin  aspart  0-6 Units Subcutaneous TID WC    metoprolol  succinate  25 mg Oral Daily   pantoprazole   40 mg Oral Daily   potassium chloride   10 mEq Oral QODAY   potassium chloride   20-40 mEq Oral Once   umeclidinium bromide   1 puff Inhalation Daily   vitamin B-12  100 mcg Oral Daily   Infusions:   heparin  800 Units/hr (01/22/24 0400)   PRN: acetaminophen  **OR** acetaminophen , ALPRAZolam , alum & mag hydroxide-simeth, cyclobenzaprine , diphenhydrAMINE , famotidine , guaiFENesin -dextromethorphan, hydrALAZINE , ipratropium-albuterol , labetalol , magnesium  hydroxide,  metoprolol  tartrate, morphine  injection, ondansetron , oxyCODONE -acetaminophen , phenol, sorbitol  Anti-infectives (From admission, onward)    Start     Dose/Rate Route Frequency Ordered Stop   01/21/24 1138  gentamicin  (GARAMYCIN ) injection  Status:  Discontinued          As needed 01/21/24 1138 01/21/24 1450   01/21/24 1137  vancomycin  (VANCOCIN ) powder  Status:  Discontinued          As needed 01/21/24 1137 01/21/24 1450   01/21/24 0800  ceFAZolin  (ANCEF ) IVPB 2g/100 mL premix        2 g 200 mL/hr over 30 Minutes Intravenous 30 min pre-op 01/20/24 1950 01/21/24 1031   01/17/24 0829  ceFAZolin  (ANCEF ) IVPB 2g/100 mL premix        2 g 200 mL/hr over 30 Minutes Intravenous 30 min pre-op 01/17/24 4098 01/17/24 2113       Assessment: 68 yr old female underwent LE angiography 6/2. 70% stenosis of the left common femoral artery with disease extending down into the proximal profunda femoris artery. No DOAC PTA. PMH: HTN, DM, IDA, PAD. No CBC will order a stat lab.   6/2 1805    HL 0.33, therapeutic x 1  650 units/hr 6/2 2145    Hepatin gtt held for 1 hr d/t bleeding at infusion site 6/3 0355    HL 0.14, SUBtherapeutic @ 550 units/hr 6/3 1150    HL 0.15, SUBtherapeutic @ 600 units/hr 6/3 1822    HL 0.23, SUBtherapeutic @ 650 units/hr 6/4 0207    HL 0.27, SUBtherapeutic @ 700 units/hr 6/4 0847    HL 0.30, barely Therapeutic ,  inc to 750 to 800 units/hr 6/4 1814    HL 0.38 6/5  0430    HL 0.36, therapeutic X 3  6/6 0431    HL 0.33, therapeutic X 4  6/7 0339    HL 0.71, slightly elevated   Goal of Therapy:  Heparin  level 0.3-0.7 units/ml Monitor platelets by anticoagulation protocol: Yes   Plan:  6/7:  HL @ 0339 = 0.71, elevated - Will decrease heparin  drip rate to 750 units/hr and recheck HL 6 hrs after rate change - CBC daily   Makensie Mulhall D Clinical Pharmacist 01/22/2024

## 2024-01-22 NOTE — Progress Notes (Signed)
 Per provider, ok to place external catheter if needed.

## 2024-01-22 NOTE — Plan of Care (Signed)
  Problem: Education: Goal: Knowledge of General Education information will improve Description: Including pain rating scale, medication(s)/side effects and non-pharmacologic comfort measures Outcome: Progressing   Problem: Health Behavior/Discharge Planning: Goal: Ability to manage health-related needs will improve Outcome: Progressing   Problem: Clinical Measurements: Goal: Ability to maintain clinical measurements within normal limits will improve Outcome: Progressing Goal: Diagnostic test results will improve Outcome: Progressing Goal: Respiratory complications will improve Outcome: Progressing Goal: Cardiovascular complication will be avoided Outcome: Progressing   Problem: Activity: Goal: Risk for activity intolerance will decrease Outcome: Progressing   Problem: Nutrition: Goal: Adequate nutrition will be maintained Outcome: Progressing   Problem: Clinical Measurements: Goal: Will remain free from infection Outcome: Not Progressing

## 2024-01-23 LAB — CBC
HCT: 22.4 % — ABNORMAL LOW (ref 36.0–46.0)
Hemoglobin: 6.8 g/dL — ABNORMAL LOW (ref 12.0–15.0)
MCH: 28.8 pg (ref 26.0–34.0)
MCHC: 30.4 g/dL (ref 30.0–36.0)
MCV: 94.9 fL (ref 80.0–100.0)
Platelets: 183 10*3/uL (ref 150–400)
RBC: 2.36 MIL/uL — ABNORMAL LOW (ref 3.87–5.11)
RDW: 14.6 % (ref 11.5–15.5)
WBC: 10.5 10*3/uL (ref 4.0–10.5)
nRBC: 0.4 % — ABNORMAL HIGH (ref 0.0–0.2)

## 2024-01-23 LAB — CBC WITH DIFFERENTIAL/PLATELET
Abs Immature Granulocytes: 0.14 10*3/uL — ABNORMAL HIGH (ref 0.00–0.07)
Basophils Absolute: 0 10*3/uL (ref 0.0–0.1)
Basophils Relative: 0 %
Eosinophils Absolute: 0.3 10*3/uL (ref 0.0–0.5)
Eosinophils Relative: 3 %
HCT: 25.6 % — ABNORMAL LOW (ref 36.0–46.0)
Hemoglobin: 8.1 g/dL — ABNORMAL LOW (ref 12.0–15.0)
Immature Granulocytes: 1 %
Lymphocytes Relative: 11 %
Lymphs Abs: 1.2 10*3/uL (ref 0.7–4.0)
MCH: 29.1 pg (ref 26.0–34.0)
MCHC: 31.6 g/dL (ref 30.0–36.0)
MCV: 92.1 fL (ref 80.0–100.0)
Monocytes Absolute: 1.5 10*3/uL — ABNORMAL HIGH (ref 0.1–1.0)
Monocytes Relative: 13 %
Neutro Abs: 7.7 10*3/uL (ref 1.7–7.7)
Neutrophils Relative %: 72 %
Platelets: 169 10*3/uL (ref 150–400)
RBC: 2.78 MIL/uL — ABNORMAL LOW (ref 3.87–5.11)
RDW: 15.2 % (ref 11.5–15.5)
WBC: 10.9 10*3/uL — ABNORMAL HIGH (ref 4.0–10.5)
nRBC: 0.3 % — ABNORMAL HIGH (ref 0.0–0.2)

## 2024-01-23 LAB — RENAL FUNCTION PANEL
Albumin: 2.8 g/dL — ABNORMAL LOW (ref 3.5–5.0)
Anion gap: 8 (ref 5–15)
BUN: 38 mg/dL — ABNORMAL HIGH (ref 8–23)
CO2: 23 mmol/L (ref 22–32)
Calcium: 8.3 mg/dL — ABNORMAL LOW (ref 8.9–10.3)
Chloride: 98 mmol/L (ref 98–111)
Creatinine, Ser: 1.98 mg/dL — ABNORMAL HIGH (ref 0.44–1.00)
GFR, Estimated: 27 mL/min — ABNORMAL LOW (ref >60)
Glucose, Bld: 145 mg/dL — ABNORMAL HIGH (ref 70–99)
Phosphorus: 4.5 mg/dL (ref 2.5–4.6)
Potassium: 5.3 mmol/L — ABNORMAL HIGH (ref 3.5–5.1)
Sodium: 129 mmol/L — ABNORMAL LOW (ref 135–145)

## 2024-01-23 LAB — GLUCOSE, CAPILLARY
Glucose-Capillary: 136 mg/dL — ABNORMAL HIGH (ref 70–99)
Glucose-Capillary: 162 mg/dL — ABNORMAL HIGH (ref 70–99)
Glucose-Capillary: 194 mg/dL — ABNORMAL HIGH (ref 70–99)
Glucose-Capillary: 253 mg/dL — ABNORMAL HIGH (ref 70–99)

## 2024-01-23 LAB — PREPARE RBC (CROSSMATCH)

## 2024-01-23 LAB — HEPARIN LEVEL (UNFRACTIONATED): Heparin Unfractionated: 0.44 [IU]/mL (ref 0.30–0.70)

## 2024-01-23 MED ORDER — SODIUM CHLORIDE 0.9% IV SOLUTION: Freq: Once | INTRAVENOUS | Status: AC

## 2024-01-23 NOTE — Progress Notes (Signed)
 Order received to discontinue heparin  gtt.

## 2024-01-23 NOTE — Progress Notes (Signed)
 Dr. Charlotte Cookey notified of patients Hgb 6.8 and BP's have been soft this shift. Patient did receive IVF bolus yesterday and NS infusing at 50ml/hr. Verbal order received to transfuse 2 units pRBC's; RBV and order entered.

## 2024-01-23 NOTE — Progress Notes (Signed)
 Subjective  - POD #2, status post left femoral endarterectomy with patch angioplasty, left external iliac stent and left SFA stent with angioplasty of the tibioperoneal trunk and posterior tibial artery   Feeling better today.  Has walked to the bathroom Decreased urine output overnight   Physical Exam:  Bilateral pedal Doppler signals No evidence of groin hematoma    Assessment/Plan:  POD #2  Acute renal insufficiency: Patient's urine output has decreased overnight.  I gave her a bolus of saline yesterday and started her on IV fluids.  Chemistries are pending.  I will discontinue nephrotoxic medications  Acute blood loss anemia: Hemoglobin down to 6.8 today.  I am giving her 2 units of blood.  I will stop her heparin  for now  Angela Jensen 01/23/2024 12:49 PM --  Vitals:   01/23/24 1230 01/23/24 1243  BP:  (!) 100/53  Pulse: 91 87  Resp: 18 14  Temp:  97.7 F (36.5 C)  SpO2: 95% 94%    Intake/Output Summary (Last 24 hours) at 01/23/2024 1249 Last data filed at 01/23/2024 1227 Gross per 24 hour  Intake 2512.67 ml  Output 10 ml  Net 2502.67 ml     Laboratory CBC    Component Value Date/Time   WBC 10.5 01/23/2024 0419   HGB 6.8 (L) 01/23/2024 0419   HGB 12.7 10/08/2023 1004   HGB 11.6 10/04/2023 1047   HCT 22.4 (L) 01/23/2024 0419   HCT 37.6 10/04/2023 1047   PLT 183 01/23/2024 0419   PLT 264 10/08/2023 1004   PLT 236 10/04/2023 1047    BMET    Component Value Date/Time   NA 135 01/22/2024 0339   NA 144 10/04/2023 1047   NA 141 04/17/2014 1055   K 5.9 (H) 01/22/2024 0339   K 3.9 04/17/2014 1055   CL 100 01/22/2024 0339   CL 102 04/17/2014 1055   CO2 29 01/22/2024 0339   CO2 30 04/17/2014 1055   GLUCOSE 147 (H) 01/22/2024 0339   GLUCOSE 201 (H) 04/17/2014 1055   BUN 18 01/22/2024 0339   BUN 12 10/04/2023 1047   BUN 10 04/17/2014 1055   CREATININE 1.15 (H) 01/22/2024 0339   CREATININE 0.84 07/06/2023 1016   CREATININE 0.90 04/17/2014 1055    CALCIUM  8.1 (L) 01/22/2024 0339   CALCIUM  9.1 04/17/2014 1055   GFRNONAA 52 (L) 01/22/2024 0339   GFRNONAA >60 07/06/2023 1016   GFRNONAA >60 04/17/2014 1055   GFRAA >60 05/05/2020 0558   GFRAA >60 04/17/2014 1055    COAG Lab Results  Component Value Date   INR 1.1 01/17/2024   INR 1.0 04/24/2020   INR 0.9 04/04/2020   No results found for: "PTT"  Antibiotics Anti-infectives (From admission, onward)    Start     Dose/Rate Route Frequency Ordered Stop   01/21/24 1138  gentamicin  (GARAMYCIN ) injection  Status:  Discontinued          As needed 01/21/24 1138 01/21/24 1450   01/21/24 1137  vancomycin  (VANCOCIN ) powder  Status:  Discontinued          As needed 01/21/24 1137 01/21/24 1450   01/21/24 0800  ceFAZolin  (ANCEF ) IVPB 2g/100 mL premix        2 g 200 mL/hr over 30 Minutes Intravenous 30 min pre-op 01/20/24 1950 01/21/24 1031   01/17/24 0829  ceFAZolin  (ANCEF ) IVPB 2g/100 mL premix        2 g 200 mL/hr over 30 Minutes Intravenous 30 min pre-op 01/17/24  4098 01/17/24 2113        Angela Jensen, M.D., [facility] [place] Office: [phone] Pager:  [phone]

## 2024-01-23 NOTE — Progress Notes (Incomplete)
 Provider to bedside to see patient, provider ordered for CBC and renal lab to be drawn post blood transfusion and for second PRBC to be administered.  Per provider to monitor for urine output at this time and

## 2024-01-23 NOTE — Plan of Care (Signed)
  Problem: Education: Goal: Knowledge of General Education information will improve Description: Including pain rating scale, medication(s)/side effects and non-pharmacologic comfort measures Outcome: Progressing   Problem: Clinical Measurements: Goal: Respiratory complications will improve Outcome: Progressing   Problem: Pain Managment: Goal: General experience of comfort will improve and/or be controlled Outcome: Progressing   Problem: Clinical Measurements: Goal: Ability to maintain clinical measurements within normal limits will improve Outcome: Not Progressing Goal: Will remain free from infection Outcome: Not Progressing   Problem: Activity: Goal: Risk for activity intolerance will decrease Outcome: Not Progressing   Problem: Nutrition: Goal: Adequate nutrition will be maintained Outcome: Not Progressing   Problem: Elimination: Goal: Will not experience complications related to bowel motility Outcome: Not Progressing Goal: Will not experience complications related to urinary retention Outcome: Not Progressing

## 2024-01-23 NOTE — Consult Note (Signed)
 PHARMACY - ANTICOAGULATION CONSULT NOTE  Pharmacy Consult for IV Hpearin  Indication: lower extremity ischemia   Patient Measurements: Height: 4\' 11"  (149.9 cm) Weight: 54.4 kg (120 lb) IBW/kg (Calculated) : 43.2 HEPARIN  DW (KG): 54.1  Labs: Recent Labs    01/21/24 0431 01/22/24 0339 01/22/24 1121 01/22/24 1845 01/23/24 0419  HGB 9.4* 8.3*  --   --  6.8*  HCT 30.8* 27.5*  --   --  22.4*  PLT 200 207  --   --  183  HEPARINUNFRC 0.33 0.71* 0.63 0.63 0.44  CREATININE  --  1.15*  --   --   --    Estimated Creatinine Clearance: 35.3 mL/min (A) (by C-G formula based on SCr of 1.15 mg/dL (H)).  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/2016   Cancer (HCC) 2012   Right Lung CA   COPD (chronic obstructive pulmonary disease) (HCC)    Depression    Diabetes mellitus without complication (HCC)    Patient takes Janumet   Essential hypertension 05/26/2016   Heart failure (HCC) 2022   Hydropneumothorax 05/03/2020   Hypercholesteremia    Oxygen  dependent    2L at nite    PAD (peripheral artery disease) (HCC) 06/22/2016   Peripheral vascular disease (HCC)    Personal history of radiation therapy    Shortness of breath dyspnea    with exertion    Sialolithiasis    Sleep apnea    Wears dentures    full upper and lower   Assessment: 68 yr old female underwent LE angiography 6/2. 70% stenosis of the left common femoral artery with disease extending down into the proximal profunda femoris artery. No DOAC PTA. PMH: HTN, DM, IDA, PAD. No CBC will order a stat lab.   6/2 1805    HL 0.33, therapeutic x 1  650 units/hr 6/2 2145    Hepatin gtt held for 1 hr d/t bleeding at infusion site 6/3 0355    HL 0.14, SUBtherapeutic @ 550 units/hr 6/3 1150    HL 0.15, SUBtherapeutic @ 600 units/hr 6/3 1822    HL 0.23, SUBtherapeutic @ 650 units/hr 6/4  0207    HL 0.27, SUBtherapeutic @ 700 units/hr 6/4 0847    HL 0.30, barely Therapeutic ,  inc to 750 to 800 units/hr 6/4 1814    HL 0.38 6/5 0430    HL 0.36, therapeutic x 3  6/6 0431    HL 0.33, therapeutic x 4  6/7 0339    HL 0.71, slightly elevated 6/7 1121    HL 0.63, therapeutic x 1 6/7 1845    HL 0.63, therapeutic x 2 6/8 0419    HL 0.44, therapeutic X 3   Goal of Therapy:  Heparin  level 0.3-0.7 units/ml Monitor platelets by anticoagulation protocol: Yes   Plan:  Heparin  level is therapeutic x 3 -- Continue heparin  infusion at 750 un/hr -- Check HL with AM labs daily while therapeutic  -- Daily CBC per protocol while on IV heparin   Thank you for allowing pharmacy to participate in this patient's care.  Nicollette Wilhelmi D, PharmD 01/23/2024 5:41 AM

## 2024-01-23 NOTE — Progress Notes (Signed)
 Provider notified of no urine output sine 6/5 and soft B/Ps.  Will continue to monitor patient.

## 2024-01-23 NOTE — Plan of Care (Signed)
 Continuing with plan of care.

## 2024-01-23 NOTE — Progress Notes (Signed)
 Per provider, continue NS at 50mL/hr.

## 2024-01-23 NOTE — Progress Notes (Signed)
 Patient due to void this shift after foley removed by day shift. Patient bladder scanned at 2145 with 0 ml showing on scan and patient denies the need to void; 2nd bladder scan done at 0300 with 0 ml urine showing on scan. Patient denies feeling like she has to void. Pure wick in place and patient instructed to void if she feels the like she can. Patient encourage to drink PO fluids also.

## 2024-01-24 ENCOUNTER — Encounter: Payer: Self-pay | Admitting: Vascular Surgery

## 2024-01-24 LAB — CBC
HCT: 29.4 % — ABNORMAL LOW (ref 36.0–46.0)
Hemoglobin: 9.3 g/dL — ABNORMAL LOW (ref 12.0–15.0)
MCH: 28.4 pg (ref 26.0–34.0)
MCHC: 31.6 g/dL (ref 30.0–36.0)
MCV: 89.6 fL (ref 80.0–100.0)
Platelets: 164 10*3/uL (ref 150–400)
RBC: 3.28 MIL/uL — ABNORMAL LOW (ref 3.87–5.11)
RDW: 15 % (ref 11.5–15.5)
WBC: 9.6 10*3/uL (ref 4.0–10.5)
nRBC: 0.2 % (ref 0.0–0.2)

## 2024-01-24 LAB — BPAM RBC
Blood Product Expiration Date: 202506302359
Blood Product Expiration Date: 202507062359
ISSUE DATE / TIME: 202506080850
ISSUE DATE / TIME: 202506081224
Unit Type and Rh: 5100
Unit Type and Rh: 5100

## 2024-01-24 LAB — TYPE AND SCREEN
ABO/RH(D): O POS
Antibody Screen: NEGATIVE
Unit division: 0
Unit division: 0

## 2024-01-24 LAB — GLUCOSE, CAPILLARY
Glucose-Capillary: 171 mg/dL — ABNORMAL HIGH (ref 70–99)
Glucose-Capillary: 149 mg/dL — ABNORMAL HIGH (ref 70–99)
Glucose-Capillary: 204 mg/dL — ABNORMAL HIGH (ref 70–99)
Glucose-Capillary: 139 mg/dL — ABNORMAL HIGH (ref 70–99)

## 2024-01-24 LAB — SURGICAL PATHOLOGY

## 2024-01-24 NOTE — Progress Notes (Signed)
 Progress Note    01/24/2024 4:24 PM 3 Days Post-Op  Subjective:  Angela Jensen is a 68 yo female now post op day #3 rom the following:  PROCEDURE: 1.   Left common femoral, profunda femoris, and superficial femoral artery endarterectomies and patch angioplasty 2.   Stent placement to the left external iliac artery with 8 mm diameter by 7.5 cm length Viabahn stent 3.   Mechanical thrombectomy to the left SFA and popliteal arteries with the 8 [place] Rex device 4.   Stent placement to proximal left SFA with 7 mm diameter by 10 cm length Viabahn stent 5.  Stent placement to the left most distal SFA and popliteal artery with 6 mm diameter by 15 cm length Viabahn stent 6.  Angioplasty of the left tibioperoneal trunk and posterior tibial arteries with 2.5 mm diameter angioplasty balloon proximally and then the entirety of the left posterior tibial artery with 2 mm diameter angioplasty: 7.  Mechanical thrombectomy of the left tibioperoneal trunk and posterior tibial arteries with the penumbra 6 bolt device 8.  Instillation of 4 mg of tPA in the left posterior tibial artery distally for residual thrombus after above procedures   Patient is resting comfortably in bed this afternoon.  No complications to note.  Patient worked with physical therapy today and did well.  Patient is eating well urinating well.  Vitals are remained stable.   Vitals:   01/24/24 0600 01/24/24 0630  BP: 114/81 129/81  Pulse: (!) 104 (!) 104  Resp: 17 11  Temp:    SpO2: 95% 100%   Physical Exam: Cardiac:  RRR, normal S1 and S2.  No rubs clicks gallops or murmurs noted. Lungs: Clear throughout on auscultation.  Normal labored breathing.  No rales rhonchi or wheezing. Incisions: Left groin incision with dressing clean dry and intact.  No hematoma seroma or infection to note. Extremities: Bilateral lower extremities today warm to touch with palpable pulses. Abdomen: Positive bowel sounds throughout, soft, nontender  and nondistended. Neurologic: Alert and oriented x 3, follows all commands and answers all questions appropriately.  CBC    Component Value Date/Time   WBC 9.6 01/24/2024 0401   RBC 3.28 (L) 01/24/2024 0401   HGB 9.3 (L) 01/24/2024 0401   HGB 12.7 10/08/2023 1004   HGB 11.6 10/04/2023 1047   HCT 29.4 (L) 01/24/2024 0401   HCT 37.6 10/04/2023 1047   PLT 164 01/24/2024 0401   PLT 264 10/08/2023 1004   PLT 236 10/04/2023 1047   MCV 89.6 01/24/2024 0401   MCV 88 10/04/2023 1047   MCV 90 04/17/2014 1055   MCH 28.4 01/24/2024 0401   MCHC 31.6 01/24/2024 0401   RDW 15.0 01/24/2024 0401   RDW 12.9 10/04/2023 1047   RDW 13.6 04/17/2014 1055   LYMPHSABS 1.2 01/23/2024 1228   LYMPHSABS 0.9 10/04/2023 1047   LYMPHSABS 1.1 04/17/2014 1055   MONOABS 1.5 (H) 01/23/2024 1228   MONOABS 0.3 04/17/2014 1055   EOSABS 0.3 01/23/2024 1228   EOSABS 0.1 10/04/2023 1047   EOSABS 0.2 04/17/2014 1055   BASOSABS 0.0 01/23/2024 1228   BASOSABS 0.0 10/04/2023 1047   BASOSABS 0.1 04/17/2014 1055    BMET    Component Value Date/Time   NA 129 (L) 01/23/2024 1228   NA 144 10/04/2023 1047   NA 141 04/17/2014 1055   K 5.3 (H) 01/23/2024 1228   K 3.9 04/17/2014 1055   CL 98 01/23/2024 1228   CL 102 04/17/2014 1055  CO2 23 01/23/2024 1228   CO2 30 04/17/2014 1055   GLUCOSE 145 (H) 01/23/2024 1228   GLUCOSE 201 (H) 04/17/2014 1055   BUN 38 (H) 01/23/2024 1228   BUN 12 10/04/2023 1047   BUN 10 04/17/2014 1055   CREATININE 1.98 (H) 01/23/2024 1228   CREATININE 0.84 07/06/2023 1016   CREATININE 0.90 04/17/2014 1055   CALCIUM  8.3 (L) 01/23/2024 1228   CALCIUM  9.1 04/17/2014 1055   GFRNONAA 27 (L) 01/23/2024 1228   GFRNONAA >60 07/06/2023 1016   GFRNONAA >60 04/17/2014 1055   GFRAA >60 05/05/2020 0558   GFRAA >60 04/17/2014 1055    INR    Component Value Date/Time   INR 1.1 01/17/2024 1208     Intake/Output Summary (Last 24 hours) at 01/24/2024 1624 Last data filed at 01/24/2024  0900 Gross per 24 hour  Intake 946.98 ml  Output 1000 ml  Net -53.02 ml     Assessment/Plan:  68 y.o. female is s/p left femoral endarterectomy with patch angioplasty and left external iliac stent with left SFA stent with angioplasty of tibioperoneal trunk and posterior tibial artery.  3 Days Post-Op   PLAN -Encouraged ambulation today with nursing staff and physical therapy.  I want her to be walking around in the ICU - Currently on aspirin  and heparin  - Acute blood loss anemia: Hemoglobin is up to 9.3 today - Continue statin therapy - Okay to transfer to floor   DVT prophylaxis: Aspirin  81 mg daily, Lipitor 10 mg daily,   Shubh Chiara R Jaystin Mcgarvey [facility] 01/24/2024 4:24 PM

## 2024-01-24 NOTE — Progress Notes (Signed)
 Physical Therapy Treatment Patient Details Name: Angela Jensen MRN: [id number] DOB: [birth year] Today's Date: 01/24/2024   History of Present Illness Pt is a 68 y/o F admitted on 01/17/24 for LLE angiography. Pt is s/p LLE femoral endarterectomy with iliac stent placement on 01/21/24. 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 Increased activity tolerance this session. Hallway ambulation performed with rolling walker with CGA for safety. Heart rate up to the 140's with mobility with dyspnea with exertion on 3 L02. Patient reports chronic dyspnea with walking at baseline. Recommend to continue PT to maximize independence and facilitate return to prior level of function.    If plan is discharge home, recommend the following: A little help with walking and/or transfers;A little help with bathing/dressing/bathroom;Assistance with cooking/housework;Assist for transportation;Help with stairs or ramp for entrance   Can travel by private vehicle        Equipment Recommendations  None recommended by PT    Recommendations for Other Services       Precautions / Restrictions Precautions Precautions: Fall Recall of Precautions/Restrictions: Intact Precaution/Restrictions Comments: watch HR, O2 Restrictions Weight Bearing Restrictions Per Provider Order: No     Mobility  Bed Mobility Overal bed mobility: Needs Assistance Bed Mobility: Supine to Sit     Supine to sit: Min assist     General bed mobility comments: trunk support provided. increased time required    Transfers Overall transfer level: Needs assistance Equipment used: Rolling walker (2 wheels) Transfers: Sit to/from Stand Sit to Stand: Contact guard assist           General transfer comment: cues for safety    Ambulation/Gait Ambulation/Gait assistance: Contact guard assist Gait Distance  (Feet): 75 Feet Assistive device: Rolling walker (2 wheels) Gait Pattern/deviations: Step-through pattern, Decreased stride length, Decreased stance time - left Gait velocity: decreased     General Gait Details: reinforcement of rolling walker use for safety and fall prevention with mobility. heart rate up to the 140's with walking. difficulty getting accurate pleth for Sp02 on 3 L02. dyspnea with exertion that patient reports is chronic   Optometrist     Tilt Bed    Modified Rankin (Stroke Patients Only)       Balance Overall balance assessment: Needs assistance Sitting-balance support: Feet supported, Feet unsupported Sitting balance-Leahy Scale: Fair     Standing balance support: During functional activity, Single extremity supported Standing balance-Leahy Scale: Fair Standing balance comment: patient able to reach forward while standing with unilateral UE support to donn briefs without loss of balance                            Communication Communication Communication: No apparent difficulties  Cognition Arousal: Alert Behavior During Therapy: WFL for tasks assessed/performed   PT - Cognitive impairments: No apparent impairments                         Following commands: Intact      Cueing Cueing Techniques: Verbal cues  Exercises      General Comments        Pertinent Vitals/Pain Pain Assessment Pain Assessment: Faces Faces Pain Scale: Hurts a little bit Pain Location: LLE with mobility Pain Descriptors / Indicators: Discomfort  Pain Intervention(s): Monitored during session, Limited activity within patient's tolerance, Repositioned    Home Living                          Prior Function            PT Goals (current goals can now be found in the care plan section) Acute Rehab PT Goals Patient Stated Goal: return home PT Goal Formulation: With patient Time For Goal Achievement:  02/05/24 Potential to Achieve Goals: Good Progress towards PT goals: Progressing toward goals    Frequency    Min 2X/week      PT Plan      Co-evaluation              AM-PAC PT "6 Clicks" Mobility   Outcome Measure  Help needed turning from your back to your side while in a flat bed without using bedrails?: A Little Help needed moving from lying on your back to sitting on the side of a flat bed without using bedrails?: A Lot Help needed moving to and from a bed to a chair (including a wheelchair)?: A Little Help needed standing up from a chair using your arms (e.g., wheelchair or bedside chair)?: A Little Help needed to walk in hospital room?: A Little Help needed climbing 3-5 steps with a railing? : A Little 6 Click Score: 17    End of Session Equipment Utilized During Treatment: Oxygen ;Gait belt Activity Tolerance: Patient tolerated treatment well Patient left: in chair;with call bell/phone within reach;with nursing/sitter in room Nurse Communication: Mobility status PT Visit Diagnosis: Pain;Muscle weakness (generalized) (M62.81);Difficulty in walking, not elsewhere classified (R26.2)     Time: 1610-9604 PT Time Calculation (min) (ACUTE ONLY): 24 min  Charges:    $Therapeutic Activity: 23-37 mins PT General Charges $$ ACUTE PT VISIT: 1 Visit                     Ozie Bo, PT, MPT    Erlene Hawks 01/24/2024, 10:25 AM

## 2024-01-24 NOTE — Plan of Care (Signed)
  Problem: Education: Goal: Knowledge of General Education information will improve Description: Including pain rating scale, medication(s)/side effects and non-pharmacologic comfort measures Outcome: Progressing   Problem: Health Behavior/Discharge Planning: Goal: Ability to manage health-related needs will improve Outcome: Progressing   Problem: Clinical Measurements: Goal: Will remain free from infection Outcome: Progressing Goal: Respiratory complications will improve Outcome: Progressing   Problem: Clinical Measurements: Goal: Ability to maintain clinical measurements within normal limits will improve Outcome: Not Progressing Goal: Diagnostic test results will improve Outcome: Not Progressing Goal: Cardiovascular complication will be avoided Outcome: Not Progressing   Problem: Activity: Goal: Risk for activity intolerance will decrease Outcome: Not Progressing   Problem: Nutrition: Goal: Adequate nutrition will be maintained Outcome: Not Progressing   Problem: Coping: Goal: Level of anxiety will decrease Outcome: Not Progressing

## 2024-01-25 LAB — CBC
HCT: 31.3 % — ABNORMAL LOW (ref 36.0–46.0)
Hemoglobin: 10 g/dL — ABNORMAL LOW (ref 12.0–15.0)
MCH: 29.7 pg (ref 26.0–34.0)
MCHC: 31.9 g/dL (ref 30.0–36.0)
MCV: 92.9 fL (ref 80.0–100.0)
Platelets: 170 10*3/uL (ref 150–400)
RBC: 3.37 MIL/uL — ABNORMAL LOW (ref 3.87–5.11)
RDW: 14.7 % (ref 11.5–15.5)
WBC: 8.4 10*3/uL (ref 4.0–10.5)
nRBC: 0 % (ref 0.0–0.2)

## 2024-01-25 LAB — GLUCOSE, CAPILLARY
Glucose-Capillary: 157 mg/dL — ABNORMAL HIGH (ref 70–99)
Glucose-Capillary: 165 mg/dL — ABNORMAL HIGH (ref 70–99)
Glucose-Capillary: 232 mg/dL — ABNORMAL HIGH (ref 70–99)
Glucose-Capillary: 158 mg/dL — ABNORMAL HIGH (ref 70–99)

## 2024-01-25 LAB — BASIC METABOLIC PANEL WITH GFR
Anion gap: 8 (ref 5–15)
BUN: 13 mg/dL (ref 8–23)
CO2: 30 mmol/L (ref 22–32)
Calcium: 9.7 mg/dL (ref 8.9–10.3)
Chloride: 97 mmol/L — ABNORMAL LOW (ref 98–111)
Creatinine, Ser: 0.67 mg/dL (ref 0.44–1.00)
GFR, Estimated: >60 mL/min (ref >60)
Glucose, Bld: 146 mg/dL — ABNORMAL HIGH (ref 70–99)
Potassium: 4.2 mmol/L (ref 3.5–5.1)
Sodium: 135 mmol/L (ref 135–145)

## 2024-01-25 MED ORDER — FLEET ENEMA RE ENEM: 1.0000 | ENEMA | Freq: Every day | RECTAL | Status: DC | PRN

## 2024-01-25 NOTE — Progress Notes (Signed)
 Progress Note    01/25/2024 9:10 AM 4 Days Post-Op  Subjective:    Angela Jensen is a 68 yo female now post op day #3 rom the following:   PROCEDURE: 1.   Left common femoral, profunda femoris, and superficial femoral artery endarterectomies and patch angioplasty 2.   Stent placement to the left external iliac artery with 8 mm diameter by 7.5 cm length Viabahn stent 3.   Mechanical thrombectomy to the left SFA and popliteal arteries with the 8 [place] Rex device 4.   Stent placement to proximal left SFA with 7 mm diameter by 10 cm length Viabahn stent 5.  Stent placement to the left most distal SFA and popliteal artery with 6 mm diameter by 15 cm length Viabahn stent 6.  Angioplasty of the left tibioperoneal trunk and posterior tibial arteries with 2.5 mm diameter angioplasty balloon proximally and then the entirety of the left posterior tibial artery with 2 mm diameter angioplasty: 7.  Mechanical thrombectomy of the left tibioperoneal trunk and posterior tibial arteries with the penumbra 6 bolt device 8.  Instillation of 4 mg of tPA in the left posterior tibial artery distally for residual thrombus after above procedures   Patient is resting comfortably in bed this afternoon.  No complications to note.  Patient worked with physical therapy today and did well.  Patient refusing to get out of bed and sit in the chair. Patient counseled to get out of bed.Patient is eating well urinating well.  Vitals are remained stable.   Vitals:   01/25/24 0800 01/25/24 0907  BP: (!) 129/118 113/80  Pulse: 97   Resp: 13   Temp:    SpO2: 96%    Physical Exam: Cardiac:  RRR, normal S1 and S2.  No rubs clicks gallops or murmurs noted. Lungs: Clear throughout on auscultation.  Normal labored breathing.  No rales rhonchi or wheezing. Incisions: Left groin incision with dressing clean dry and intact.  No hematoma seroma or infection to note. Extremities: Bilateral lower extremities today warm to touch  with palpable pulses. Abdomen: Positive bowel sounds throughout, soft, nontender and nondistended. Neurologic: Alert and oriented x 3, follows all commands and answers all questions appropriately.   CBC    Component Value Date/Time   WBC 8.4 01/25/2024 0313   RBC 3.37 (L) 01/25/2024 0313   HGB 10.0 (L) 01/25/2024 0313   HGB 12.7 10/08/2023 1004   HGB 11.6 10/04/2023 1047   HCT 31.3 (L) 01/25/2024 0313   HCT 37.6 10/04/2023 1047   PLT 170 01/25/2024 0313   PLT 264 10/08/2023 1004   PLT 236 10/04/2023 1047   MCV 92.9 01/25/2024 0313   MCV 88 10/04/2023 1047   MCV 90 04/17/2014 1055   MCH 29.7 01/25/2024 0313   MCHC 31.9 01/25/2024 0313   RDW 14.7 01/25/2024 0313   RDW 12.9 10/04/2023 1047   RDW 13.6 04/17/2014 1055   LYMPHSABS 1.2 01/23/2024 1228   LYMPHSABS 0.9 10/04/2023 1047   LYMPHSABS 1.1 04/17/2014 1055   MONOABS 1.5 (H) 01/23/2024 1228   MONOABS 0.3 04/17/2014 1055   EOSABS 0.3 01/23/2024 1228   EOSABS 0.1 10/04/2023 1047   EOSABS 0.2 04/17/2014 1055   BASOSABS 0.0 01/23/2024 1228   BASOSABS 0.0 10/04/2023 1047   BASOSABS 0.1 04/17/2014 1055    BMET    Component Value Date/Time   NA 135 01/25/2024 0313   NA 144 10/04/2023 1047   NA 141 04/17/2014 1055   K 4.2 01/25/2024 0313  K 3.9 04/17/2014 1055   CL 97 (L) 01/25/2024 0313   CL 102 04/17/2014 1055   CO2 30 01/25/2024 0313   CO2 30 04/17/2014 1055   GLUCOSE 146 (H) 01/25/2024 0313   GLUCOSE 201 (H) 04/17/2014 1055   BUN 13 01/25/2024 0313   BUN 12 10/04/2023 1047   BUN 10 04/17/2014 1055   CREATININE 0.67 01/25/2024 0313   CREATININE 0.84 07/06/2023 1016   CREATININE 0.90 04/17/2014 1055   CALCIUM  9.7 01/25/2024 0313   CALCIUM  9.1 04/17/2014 1055   GFRNONAA >60 01/25/2024 0313   GFRNONAA >60 07/06/2023 1016   GFRNONAA >60 04/17/2014 1055   GFRAA >60 05/05/2020 0558   GFRAA >60 04/17/2014 1055    INR    Component Value Date/Time   INR 1.1 01/17/2024 1208     Intake/Output Summary (Last  24 hours) at 01/25/2024 0910 Last data filed at 01/25/2024 0600 Gross per 24 hour  Intake --  Output 550 ml  Net -550 ml     Assessment/Plan:  68 y.o. female is s/p left femoral endarterectomy with patch angioplasty and left external iliac stent with left SFA stent with angioplasty of tibioperoneal trunk and posterior tibial artery.  4 Days Post-Op   PLAN -Encouraged ambulation today with nursing staff and physical therapy.  I want her to be walking around in the ICU - Currently on aspirin  and heparin  - Acute blood loss anemia: Hemoglobin is up to 10.0 today - Continue statin therapy - Okay to transfer to floor  DVT prophylaxis:  Aspirin  81 mg daily, Lipitor 10 mg daily,    Nupur Hohman R Shondell [facility] 01/25/2024 9:10 AM

## 2024-01-25 NOTE — Plan of Care (Signed)
  Problem: Education: Goal: Knowledge of General Education information will improve Description: Including pain rating scale, medication(s)/side effects and non-pharmacologic comfort measures Outcome: Progressing   Problem: Clinical Measurements: Goal: Ability to maintain clinical measurements within normal limits will improve Outcome: Progressing Goal: Will remain free from infection Outcome: Progressing Goal: Diagnostic test results will improve Outcome: Progressing Goal: Respiratory complications will improve Outcome: Progressing   Problem: Pain Managment: Goal: General experience of comfort will improve and/or be controlled Outcome: Progressing

## 2024-01-25 NOTE — Progress Notes (Signed)
 0840 Attempted to call report to 2 C. Nurse bust. [phone] Called report Aldo RN. Patient walked to toilet with front wheel walker. [id number] Patient transferred to room 223 via bed.

## 2024-01-25 NOTE — Progress Notes (Signed)
 Occupational Therapy Treatment Patient Details Name: Angela Jensen MRN: [id number] DOB: 01-26-1956 Today's Date: 01/25/2024   History of present illness Pt is a 68 y/o F admitted on 01/17/24 for LLE angiography. Pt is s/p LLE femoral endarterectomy with iliac stent placement on 01/21/24. 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 With max encouragement, pt agreeable to get OOB to recliner for lunch. Performs bed mobility with supervision, cues needed for safety and environmental awareness, performs step pivot bed > recliner with +1 HHA for safety. Grooming tasks completed seated, pt coughing up small amounts of reddish phlegm (RN notified). HR up to 100 bpm with transfer, on 3L O2/min SpO2 95%.  Pt encouraged to participate in mobility to prevent deconditioning and increase independence. OT will continue to follow, discharge recommendation appropriate.       If plan is discharge home, recommend the following:  A little help with walking and/or transfers;A little help with bathing/dressing/bathroom   Equipment Recommendations  None recommended by OT       Precautions / Restrictions Precautions Precautions: Fall Recall of Precautions/Restrictions: Intact Precaution/Restrictions Comments: watch HR, O2 Restrictions Weight Bearing Restrictions Per Provider Order: No       Mobility Bed Mobility Overal bed mobility: Needs Assistance Bed Mobility: Supine to Sit     Supine to sit: Supervision          Transfers Overall transfer level: Needs assistance Equipment used: 1 person hand held assist Transfers: Sit to/from Stand Sit to Stand: Contact guard assist     Step pivot transfers: Contact guard assist           Balance Overall balance assessment: Needs assistance Sitting-balance support: Feet supported, Feet  unsupported Sitting balance-Leahy Scale: Fair     Standing balance support: During functional activity, Single extremity supported Standing balance-Leahy Scale: Fair                             ADL either performed or assessed with clinical judgement   ADL Overall ADL's : Needs assistance/impaired     Grooming: Wash/dry face;Sitting;Set up                               Functional mobility during ADLs: Contact guard assist General ADL Comments: Pt agreeable to seated ADL performance in recliner, max encourgement required to participate with pt refusing to perform further mobility or ADLs due to fatigue     Communication Communication Communication: No apparent difficulties Factors Affecting Communication: Hearing impaired   Cognition Arousal: Alert Behavior During Therapy: WFL for tasks assessed/performed Cognition: No family/caregiver present to determine baseline             OT - Cognition Comments: Pt reports she is tired, is quite irritated throughout session, participates with max encourgement                 Following commands: Intact        Cueing   Cueing Techniques: Verbal cues             Pertinent Vitals/ Pain       Pain Assessment Pain Assessment: No/denies pain         Frequency  Min 2X/week        Progress Toward Goals  OT Goals(current goals can now be found in the care plan section)  Progress towards OT goals: Progressing toward goals  Acute Rehab OT Goals OT Goal Formulation: With patient Time For Goal Achievement: 02/05/24 Potential to Achieve Goals: Good  Plan         AM-PAC OT "6 Clicks" Daily Activity     Outcome Measure     Help from another person taking care of personal grooming?: A Little Help from another person toileting, which includes using toliet, bedpan, or urinal?: A Little Help from another person bathing (including washing, rinsing, drying)?: A Little Help from another  person to put on and taking off regular upper body clothing?: A Little Help from another person to put on and taking off regular lower body clothing?: A Little 6 Click Score: 15    End of Session Equipment Utilized During Treatment: Oxygen   OT Visit Diagnosis: Unsteadiness on feet (R26.81);Other abnormalities of gait and mobility (R26.89)   Activity Tolerance Patient tolerated treatment well   Patient Left in chair;with call bell/phone within reach;with chair alarm set   Nurse Communication Mobility status        Time: 1610-9604 OT Time Calculation (min): 25 min  Charges: OT General Charges $OT Visit: 1 Visit OT Treatments $Self Care/Home Management : 23-37 mins  Andreah Goheen L. Zaccai Chavarin, OTR/L  01/25/24, 2:01 PM

## 2024-01-25 NOTE — Progress Notes (Signed)
 PT Cancellation Note  Patient Details Name: Angela Jensen MRN: [id number] DOB: 22-Mar-1956   Cancelled Treatment:    Reason Eval/Treat Not Completed: Other (comment): Chart reviewed. Patient received in bed, refused PT services due to constipation. Requesting laxative from RN, RN notified. Patient continue to decline/refuse despite encouragement. Will re-attempt at later date/time as appropriate.    Frederich Montilla M Fairly, PT, DPT 01/25/24 2:17 PM

## 2024-01-26 LAB — GLUCOSE, CAPILLARY
Glucose-Capillary: 195 mg/dL — ABNORMAL HIGH (ref 70–99)
Glucose-Capillary: 196 mg/dL — ABNORMAL HIGH (ref 70–99)
Glucose-Capillary: 198 mg/dL — ABNORMAL HIGH (ref 70–99)
Glucose-Capillary: 190 mg/dL — ABNORMAL HIGH (ref 70–99)

## 2024-01-26 MED ORDER — CLOPIDOGREL BISULFATE 75 MG PO TABS
75.0000 mg | ORAL_TABLET | Freq: Every day | ORAL | Status: DC
  Administered 2024-01-26 – 2024-02-02 (×8): 75 mg via ORAL
  Filled 2024-01-26 (×8): qty 1

## 2024-01-26 NOTE — Progress Notes (Signed)
 Progress Note    01/26/2024 8:31 AM 5 Days Post-Op  Subjective:  Angela Jensen is a 68 yo female now post op day #5 rom the following:   PROCEDURE: 1.   Left common femoral, profunda femoris, and superficial femoral artery endarterectomies and patch angioplasty 2.   Stent placement to the left external iliac artery with 8 mm diameter by 7.5 cm length Viabahn stent 3.   Mechanical thrombectomy to the left SFA and popliteal arteries with the 8 [place] Rex device 4.   Stent placement to proximal left SFA with 7 mm diameter by 10 cm length Viabahn stent 5.  Stent placement to the left most distal SFA and popliteal artery with 6 mm diameter by 15 cm length Viabahn stent 6.  Angioplasty of the left tibioperoneal trunk and posterior tibial arteries with 2.5 mm diameter angioplasty balloon proximally and then the entirety of the left posterior tibial artery with 2 mm diameter angioplasty: 7.  Mechanical thrombectomy of the left tibioperoneal trunk and posterior tibial arteries with the penumbra 6 bolt device 8.  Instillation of 4 mg of tPA in the left posterior tibial artery distally for residual thrombus after above procedures   Patient is resting comfortably in bed this afternoon.  No complications to note.  Patient worked with physical therapy today and did well.  Patient refusing to get out of bed and sit in the chair. Patient counseled to get out of bed.Patient is eating well urinating well.  Vitals are remained stable.  Vitals:   01/26/24 0334 01/26/24 0746  BP: 108/89 124/68  Pulse: 89 83  Resp: 20 17  Temp: 97.7 F (36.5 C) 98.5 F (36.9 C)  SpO2: 96% 99%   Physical Exam: Cardiac:  RRR, normal S1 and S2.  No rubs clicks gallops or murmurs noted. Lungs: Clear throughout on auscultation.  Normal labored breathing.  No rales rhonchi or wheezing. Incisions: Left groin incision with dressing clean dry and intact.  No hematoma seroma or infection to note. Extremities: Bilateral lower  extremities today warm to touch with palpable pulses. Abdomen: Positive bowel sounds throughout, soft, nontender and nondistended. Neurologic: Alert and oriented x 3, follows all commands and answers all questions appropriately.    CBC    Component Value Date/Time   WBC 8.4 01/25/2024 0313   RBC 3.37 (L) 01/25/2024 0313   HGB 10.0 (L) 01/25/2024 0313   HGB 12.7 10/08/2023 1004   HGB 11.6 10/04/2023 1047   HCT 31.3 (L) 01/25/2024 0313   HCT 37.6 10/04/2023 1047   PLT 170 01/25/2024 0313   PLT 264 10/08/2023 1004   PLT 236 10/04/2023 1047   MCV 92.9 01/25/2024 0313   MCV 88 10/04/2023 1047   MCV 90 04/17/2014 1055   MCH 29.7 01/25/2024 0313   MCHC 31.9 01/25/2024 0313   RDW 14.7 01/25/2024 0313   RDW 12.9 10/04/2023 1047   RDW 13.6 04/17/2014 1055   LYMPHSABS 1.2 01/23/2024 1228   LYMPHSABS 0.9 10/04/2023 1047   LYMPHSABS 1.1 04/17/2014 1055   MONOABS 1.5 (H) 01/23/2024 1228   MONOABS 0.3 04/17/2014 1055   EOSABS 0.3 01/23/2024 1228   EOSABS 0.1 10/04/2023 1047   EOSABS 0.2 04/17/2014 1055   BASOSABS 0.0 01/23/2024 1228   BASOSABS 0.0 10/04/2023 1047   BASOSABS 0.1 04/17/2014 1055    BMET    Component Value Date/Time   NA 135 01/25/2024 0313   NA 144 10/04/2023 1047   NA 141 04/17/2014 1055   K 4.2  01/25/2024 0313   K 3.9 04/17/2014 1055   CL 97 (L) 01/25/2024 0313   CL 102 04/17/2014 1055   CO2 30 01/25/2024 0313   CO2 30 04/17/2014 1055   GLUCOSE 146 (H) 01/25/2024 0313   GLUCOSE 201 (H) 04/17/2014 1055   BUN 13 01/25/2024 0313   BUN 12 10/04/2023 1047   BUN 10 04/17/2014 1055   CREATININE 0.67 01/25/2024 0313   CREATININE 0.84 07/06/2023 1016   CREATININE 0.90 04/17/2014 1055   CALCIUM  9.7 01/25/2024 0313   CALCIUM  9.1 04/17/2014 1055   GFRNONAA >60 01/25/2024 0313   GFRNONAA >60 07/06/2023 1016   GFRNONAA >60 04/17/2014 1055   GFRAA >60 05/05/2020 0558   GFRAA >60 04/17/2014 1055    INR    Component Value Date/Time   INR 1.1 01/17/2024 1208      Intake/Output Summary (Last 24 hours) at 01/26/2024 0831 Last data filed at 01/26/2024 0158 Gross per 24 hour  Intake 440 ml  Output --  Net 440 ml     Assessment/Plan:  68 y.o. female is s/p SEE ABOVE 5 Days Post-Op   PLAN -Encouraged ambulation today with nursing staff and physical therapy.  I want her to be walking around the nursing unit - Currently on aspirin  - Acute blood loss anemia: Hemoglobin is up to 10.0 yesterday - Continue statin therapy  Home Health Nursing, PY and OT ordered  DVT prophylaxis:  Aspirin  81 mg daily, Lipitor 10 mg daily,    Nisha Dhami R Kraven [facility] 01/26/2024 8:31 AM

## 2024-01-26 NOTE — Progress Notes (Signed)
 Physical Therapy Treatment Patient Details Name: Angela Jensen MRN: [id number] DOB: [birth year] Today's Date: 01/26/2024   History of Present Illness Pt is a 68 y/o F admitted on 01/17/24 for LLE angiography. Pt is s/p LLE femoral endarterectomy with iliac stent placement on 01/21/24. PMH: asthma, lung CA, COPD, depression, DM, HTN, hypercholesterolemia, O2 dependent at night, PAD, Sialolithiasis, sleep apnea    PT Comments  Patient received seated in recliner, agreeable to PT session. Patient able to ambulate in hallway, approx 120 ft with use of RW. Continued education on use of AD for improved stability/activity tolerance. Patient's HR low to mid 110's with activity, and mild dyspnea with activity on 3L 02 via [place]. Patient left supine in bed with all needs in reach, and NT at bedside. Discharge recommendation remains appropriate. Will continue to follow acutely.    If plan is discharge home, recommend the following: A little help with walking and/or transfers;A little help with bathing/dressing/bathroom;Assistance with cooking/housework;Assist for transportation;Help with stairs or ramp for entrance   Can travel by private vehicle        Equipment Recommendations  None recommended by PT    Recommendations for Other Services       Precautions / Restrictions Precautions Precautions: Fall Recall of Precautions/Restrictions: Intact Precaution/Restrictions Comments: watch HR, O2 Restrictions Weight Bearing Restrictions Per Provider Order: No     Mobility  Bed Mobility Overal bed mobility: Modified Independent Bed Mobility: Sit to Supine       Sit to supine: Modified independent (Device/Increase time)   General bed mobility comments: Patient recieved in recliner; patient able to complete sit > supine MOD I    Transfers Overall transfer level: Needs assistance Equipment used: Rolling walker (2 wheels) Transfers: Sit to/from Stand Sit to Stand: Supervision           General  transfer comment: Pt able to stand from recliner with supervision with use of RW. Patient HR: 95, Sp02: 89% on 3L supplemental oxygen  via [place].    Ambulation/Gait Ambulation/Gait assistance: Supervision Gait Distance (Feet): 120 Feet Assistive device: Rolling walker (2 wheels) Gait Pattern/deviations: Step-through pattern, Decreased stride length, Decreased stance time - left Gait velocity: decreased     General Gait Details: Continued gait training with use of RW, able to ambulate x 120 ft with use of RW, 3L supplemental 02 via [place]. Patient's HR stayed in low to mid 110's with ambulation, unable to get  pleth/reading on Sp02 during ambulation. Patient reports mild dyspnea.   Stairs             Wheelchair Mobility     Tilt Bed    Modified Rankin (Stroke Patients Only)       Balance Overall balance assessment: Needs assistance Sitting-balance support: Feet supported, No upper extremity supported Sitting balance-Leahy Scale: Good     Standing balance support: Bilateral upper extremity supported, During functional activity Standing balance-Leahy Scale: Fair                              Musician Factors Affecting Communication: Hearing impaired  Cognition Arousal: Alert Behavior During Therapy: WFL for tasks assessed/performed   PT - Cognitive impairments: No apparent impairments                         Following commands: Intact      Cueing Cueing Techniques: Verbal cues  Exercises      General  Comments        Pertinent Vitals/Pain Pain Assessment Pain Assessment: No/denies pain    Home Living                          Prior Function            PT Goals (current goals can now be found in the care plan section) Acute Rehab PT Goals PT Goal Formulation: With patient Time For Goal Achievement: 02/05/24 Potential to Achieve Goals: Good Progress towards PT goals: Progressing toward goals     Frequency    Min 2X/week      PT Plan      Co-evaluation              AM-PAC PT 6 Clicks Mobility   Outcome Measure  Help needed turning from your back to your side while in a flat bed without using bedrails?: A Little Help needed moving from lying on your back to sitting on the side of a flat bed without using bedrails?: A Lot Help needed moving to and from a bed to a chair (including a wheelchair)?: A Little Help needed standing up from a chair using your arms (e.g., wheelchair or bedside chair)?: A Little Help needed to walk in hospital room?: A Little Help needed climbing 3-5 steps with a railing? : A Little 6 Click Score: 17    End of Session Equipment Utilized During Treatment: Oxygen ;Gait belt Activity Tolerance: Patient tolerated treatment well Patient left: in bed;with bed alarm set;with call bell/phone within reach;with nursing/sitter in room Nurse Communication: Mobility status PT Visit Diagnosis: Pain;Muscle weakness (generalized) (M62.81);Difficulty in walking, not elsewhere classified (R26.2)     Time: 9629-5284 PT Time Calculation (min) (ACUTE ONLY): 20 min  Charges:    $Gait Training: 8-22 mins PT General Charges $$ ACUTE PT VISIT: 1 Visit                     Donnie Galea, PT, DPT 01/26/24 12:09 PM

## 2024-01-26 NOTE — Plan of Care (Signed)

## 2024-01-26 NOTE — Progress Notes (Signed)
 [facility]  Daily Progress Note   Subjective  -   Feels like she is getting better and stronger.  Working with therapy although she does have some hypoxia with ambulation.  Requires more oxygen  therapy with ambulation.  Objective Vitals:   01/25/24 1603 01/25/24 2136 01/26/24 0334 01/26/24 0746  BP: 133/67 112/71 108/89 124/68  Pulse: 86 84 89 83  Resp: 20 20 20 17   Temp: 98.3 F (36.8 C) 98.5 F (36.9 C) 97.7 F (36.5 C) 98.5 F (36.9 C)  TempSrc: Oral Oral Oral Oral  SpO2: 95% 98% 96% 99%  Weight:      Height:        Intake/Output Summary (Last 24 hours) at 01/26/2024 1441 Last data filed at 01/26/2024 1432 Gross per 24 hour  Intake 680 ml  Output --  Net 680 ml    PULM  CTAB CV  RRR VASC  foot is warm with 1+ left posterior tibial pulse present.  Laboratory CBC    Component Value Date/Time   WBC 8.4 01/25/2024 0313   HGB 10.0 (L) 01/25/2024 0313   HGB 12.7 10/08/2023 1004   HGB 11.6 10/04/2023 1047   HCT 31.3 (L) 01/25/2024 0313   HCT 37.6 10/04/2023 1047   PLT 170 01/25/2024 0313   PLT 264 10/08/2023 1004   PLT 236 10/04/2023 1047    BMET    Component Value Date/Time   NA 135 01/25/2024 0313   NA 144 10/04/2023 1047   NA 141 04/17/2014 1055   K 4.2 01/25/2024 0313   K 3.9 04/17/2014 1055   CL 97 (L) 01/25/2024 0313   CL 102 04/17/2014 1055   CO2 30 01/25/2024 0313   CO2 30 04/17/2014 1055   GLUCOSE 146 (H) 01/25/2024 0313   GLUCOSE 201 (H) 04/17/2014 1055   BUN 13 01/25/2024 0313   BUN 12 10/04/2023 1047   BUN 10 04/17/2014 1055   CREATININE 0.67 01/25/2024 0313   CREATININE 0.84 07/06/2023 1016   CREATININE 0.90 04/17/2014 1055   CALCIUM  9.7 01/25/2024 0313   CALCIUM  9.1 04/17/2014 1055   GFRNONAA >60 01/25/2024 0313   GFRNONAA >60 07/06/2023 1016   GFRNONAA >60 04/17/2014 1055   GFRAA >60 05/05/2020 0558   GFRAA >60 04/17/2014 1055    Assessment/Planning: POD #5 s/p left femoral endarterectomy, left iliac  stent, and left SFA, popliteal, and posterior tibial interventions  Foot is warm and well-perfused.  Biggest issue at this point is her breathing and slow ambulation with multiple issues including her recent surgery Has underlying significant lung disease and has oxygen  available at home at a baseline Continue to increase activity.  Possibly discharge tomorrow or Friday.   Mikki Alexander  01/26/2024, 2:41 PM

## 2024-01-26 NOTE — TOC Initial Note (Addendum)
 Transition of Care [facility]) - Initial/Assessment Note    Patient Details  Name: Angela Jensen MRN: [id number] Date of Birth: Mar 25, 1956  Transition of Care [facility]) CM/SW Contact:    Odilia Bennett, LCSW Phone Number: 01/26/2024, 10:43 AM  Clinical Narrative:  Readmission prevention screen complete. CSW met with patient. No family at bedside. CSW introduced role and explained that discharge planning would be discussed. PCP is Laurence Pons, NP. Husband drives her to appointments. [facility] is [facility] [address]. No issues obtaining medications. Patient lives at home with her husband. No home health prior to admission but she is agreeable to PT and OT. [facility] first preference. CSW left message for liaison to see if they can accept. Patient has a RW, rollator, wheelchair, cane, BSC, and shower chair at home. She also wears 3 L of home oxygen  through [employer]. No further concerns. CSW will continue to follow patient for support and facilitate return home once stable. Husband will transport her home at discharge.                11:34 am: [facility] is unable to accept referral. CSW will check with other agencies.  11:53 am: [facility] has accepted for PT and OT. Patient is aware and agreeable.  Expected Discharge Plan: Home w Home Health Services Barriers to Discharge: Continued Medical Work up   Patient Goals and CMS Choice   CMS Medicare.gov Compare Post Acute Care list provided to:: Patient        Expected Discharge Plan and Services     Post Acute Care Choice: Home Health Living arrangements for the past 2 months: Single Family Home                                      Prior Living Arrangements/Services Living arrangements for the past 2 months: Single Family Home Lives with:: Spouse Patient language and need for interpreter reviewed:: Yes Do you feel safe going back to the place where you live?: Yes      Need for Family Participation in  Patient Care: Yes (Comment) Care giver support system in place?: Yes (comment) Current home services: DME Criminal Activity/Legal Involvement Pertinent to Current Situation/Hospitalization: No - Comment as needed  Activities of Daily Living   ADL Screening (condition at time of admission) Independently performs ADLs?: Yes (appropriate for developmental age) Is the patient deaf or have difficulty hearing?: No Does the patient have difficulty seeing, even when wearing glasses/contacts?: No Does the patient have difficulty concentrating, remembering, or making decisions?: No  Permission Sought/Granted Permission sought to share information with : Facility Industrial/product designer granted to share information with : Yes, Verbal Permission Granted     Permission granted to share info w AGENCY: Home health agencies        Emotional Assessment Appearance:: Appears stated age Attitude/Demeanor/Rapport: Engaged, Gracious Affect (typically observed): Accepting, Appropriate, Calm, Pleasant Orientation: : Oriented to Self, Oriented to Place, Oriented to  Time, Oriented to Situation Alcohol / Substance Use: Not Applicable Psych Involvement: No (comment)  Admission diagnosis:  Atherosclerosis of artery of extremity with rest pain (HCC) [I70.229] Ischemic leg [I99.8] Patient Active Problem List   Diagnosis Date Noted   Atherosclerosis of artery of extremity with rest pain (HCC) 01/18/2024   Ischemic leg 01/17/2024   Presbycusis of both ears 08/13/2023   Acute on chronic respiratory failure with hypoxia and hypercapnia (  HCC) 06/15/2023   Acute on chronic diastolic CHF (congestive heart failure) (HCC) 06/15/2023   Transaminitis 06/15/2023   Overweight (BMI 25.0-29.9) 06/15/2023   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   Chronic right shoulder pain 11/30/2022   Anxiety and depression 11/19/2022   GERD without esophagitis 11/19/2022   Uncontrolled type 2  diabetes mellitus with hyperglycemia, with long-term current use of insulin  (HCC) 11/19/2022   Right rib fracture 09/23/2022   Symptomatic bradycardia 07/09/2021   Chronic respiratory failure with hypoxia (HCC)    CAP (community acquired pneumonia) 06/26/2021   COPD exacerbation (HCC) 06/11/2021   SOB (shortness of breath) 05/01/2021   HCAP (healthcare-associated pneumonia) 05/01/2021   Chronic combined systolic and diastolic CHF (congestive heart failure) (HCC) 05/01/2021   Acute exacerbation of chronic obstructive pulmonary disease (COPD) (HCC) 04/30/2021   Elevated troponin 04/01/2021   Acute on chronic respiratory failure with hypoxia (HCC) 03/31/2021   History of recurrent right pneumothorax 05/01/2020   Chest pain on breathing 04/21/2020   Pleuritic chest pain 04/11/2020   Right lower lobe pulmonary nodule 04/02/2020   IDA (iron  deficiency anemia) 04/02/2020   Pica in adults 04/02/2020   Goals of care, counseling/discussion 03/14/2020   Pain in left wrist 09/24/2019   Encounter for general adult medical examination with abnormal findings 06/28/2019   Elevated total protein 01/22/2019   Uncontrolled type 2 diabetes mellitus with hyperglycemia (HCC) 05/16/2018   Chronic obstructive pulmonary disease (HCC) 05/16/2018   Vitamin D  deficiency 05/16/2018   Flu vaccine need 05/16/2018   Dysuria 05/16/2018   Cancer of lower lobe of right lung (HCC) 01/21/2018   Screening for breast cancer 10/12/2017   Personal history of tobacco use, presenting hazards to health 02/03/2017   PAD (peripheral artery disease) (HCC) 06/22/2016   Essential hypertension 05/26/2016   Diabetes mellitus (HCC) 05/26/2016   Hyperlipidemia associated with type 2 diabetes mellitus (HCC) 05/26/2016   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/2016   Uterine leiomyoma 04/30/2016   Elevated CEA 01/31/2016   Special screening for malignant neoplasms, colon    Benign  neoplasm of sigmoid colon    Primary lung cancer (HCC)    PCP:  Laurence Pons, NP Pharmacy:   [facility] [address] ([address]), [place] - [address]) [place] [address] Phone: [phone] Fax: [phone]     Social Drivers of Health (SDOH) Social History: SDOH Screenings   Food Insecurity: No Food Insecurity (01/17/2024)  Housing: Unknown (01/17/2024)  Transportation Needs: No Transportation Needs (01/17/2024)  Utilities: Not At Risk (01/17/2024)  Alcohol Screen: Low Risk  (12/11/2021)  Depression (PHQ2-9): Low Risk  (10/13/2023)  Social Connections: Socially Integrated (01/17/2024)  Tobacco Use: Medium Risk (01/21/2024)   SDOH Interventions:     Readmission Risk Interventions    01/26/2024   10:40 AM 06/13/2021    2:40 PM 05/05/2021   12:24 PM  Readmission Risk Prevention Plan  Transportation Screening Complete Complete Complete  PCP or Specialist Appt within 5-7 Days   Complete  PCP or Specialist Appt within 3-5 Days Complete    Home Care Screening   Complete  Medication Review (RN CM)   Complete  Social Work Consult for Recovery Care Planning/Counseling Complete Complete   Palliative Care Screening Not Applicable Not Applicable   Medication Review Oceanographer) Complete Complete

## 2024-01-26 NOTE — Plan of Care (Signed)

## 2024-01-26 NOTE — Progress Notes (Signed)
 Occupational Therapy Treatment Patient Details Name: Angela Jensen MRN: [id number] DOB: May 18, 1956 Today's Date: 01/26/2024   History of present illness Pt is a 68 y/o F admitted on 01/17/24 for LLE angiography. Pt is s/p LLE femoral endarterectomy with iliac stent placement on 01/21/24. PMH: asthma, lung CA, COPD, depression, DM, HTN, hypercholesterolemia, O2 dependent at night, PAD, Sialolithiasis, sleep apnea   OT comments  Pt making progress towards functional gains. Requires supervision overall for bed > BSC step pivot transfer with toileting needs including clothing management and hygiene from lateral leans while seated, and functional mobility in hallway using RW 100 ft. Mild dyspnea noted throughout, on 3L via [place] (her baseline) start of session. SpO2 dropping to 84% with mobility, requires 6L and PLB techniques while walking 50 ft to achieve 90%, able to decrease down to 4L for last 50 ft (O2 tank adjusts in 2 increments) for 90%>. Pt back on 3L end of session with SpO2 90%>. HR under 104 throughout. Discharge recommendation appropriate, OT will continue to follow for functional gains.       If plan is discharge home, recommend the following:  A little help with walking and/or transfers;A little help with bathing/dressing/bathroom   Equipment Recommendations  None recommended by OT       Precautions / Restrictions Precautions Precautions: Fall Recall of Precautions/Restrictions: Intact Precaution/Restrictions Comments: watch HR, O2 Restrictions Weight Bearing Restrictions Per Provider Order: No       Mobility Bed Mobility Overal bed mobility: Modified Independent                  Transfers Overall transfer level: Needs assistance Equipment used: Rolling walker (2 wheels), None Transfers: Sit to/from Stand Sit to Stand: Supervision     Step pivot transfers: Supervision     General transfer comment: STS from bed no AD to pivot to [place], RW to step pivot to reclienr end  of session supervision     Balance Overall balance assessment: Needs assistance Sitting-balance support: Feet supported, No upper extremity supported Sitting balance-Leahy Scale: Good     Standing balance support: Bilateral upper extremity supported, During functional activity Standing balance-Leahy Scale: Fair                             ADL either performed or assessed with clinical judgement   ADL Overall ADL's : Needs assistance/impaired                         Toilet Transfer: BSC/3in1;Stand-pivot;Supervision/safety   Toileting- Clothing Manipulation and Hygiene: Supervision/safety;Sitting/lateral lean       Functional mobility during ADLs: Supervision/safety;Rolling walker (2 wheels) General ADL Comments: Pt completes stand pivot to BSC from bed, walks 100 ft in hallway with supervision and RW     Communication Communication Communication: No apparent difficulties Factors Affecting Communication: Hearing impaired   Cognition Arousal: Alert Behavior During Therapy: WFL for tasks assessed/performed Cognition: No family/caregiver present to determine baseline                               Following commands: Intact        Cueing   Cueing Techniques: Verbal cues        General Comments Pt on 3L via [place] (her baseline) start of session. SpO2 dropping to 84% with mobility, requires 6L and PLB techniques while walking 50 ft  to achieve 90%, able to decrease down to 4L for last 50 ft (O2 tank adjusts in 2 increments) for 90%>. Pt back on 3L end of session with SpO2 90%>. HR under 104 throughout.    Pertinent Vitals/ Pain       Pain Assessment Pain Assessment: 0-10 Pain Score: 7  Pain Location: LLE with mobility Pain Descriptors / Indicators: Discomfort Pain Intervention(s): Limited activity within patient's tolerance, Monitored during session, Patient requesting pain meds-RN notified   Frequency  Min 2X/week        Progress  Toward Goals  OT Goals(current goals can now be found in the care plan section)  Progress towards OT goals: Progressing toward goals  Acute Rehab OT Goals OT Goal Formulation: With patient Time For Goal Achievement: 02/05/24 Potential to Achieve Goals: Good  Plan         AM-PAC OT 6 Clicks Daily Activity     Outcome Measure   Help from another person eating meals?: None Help from another person taking care of personal grooming?: A Little Help from another person toileting, which includes using toliet, bedpan, or urinal?: A Little Help from another person bathing (including washing, rinsing, drying)?: A Little Help from another person to put on and taking off regular upper body clothing?: A Little Help from another person to put on and taking off regular lower body clothing?: A Little 6 Click Score: 19    End of Session Equipment Utilized During Treatment: Gait belt;Rolling walker (2 wheels);Oxygen   OT Visit Diagnosis: Unsteadiness on feet (R26.81);Other abnormalities of gait and mobility (R26.89)   Activity Tolerance Patient tolerated treatment well   Patient Left in chair;with call bell/phone within reach;with chair alarm set   Nurse Communication Mobility status        Time: 1203-1220 OT Time Calculation (min): 17 min  Charges: OT General Charges $OT Visit: 1 Visit OT Treatments $Self Care/Home Management : 8-22 mins  Damarko Stitely L. Loann Chahal, OTR/L  01/26/24, 2:38 PM

## 2024-01-27 ENCOUNTER — Inpatient Hospital Stay

## 2024-01-27 DIAGNOSIS — Z95811 Presence of heart assist device: Secondary | ICD-10-CM

## 2024-01-27 DIAGNOSIS — I5032 Chronic diastolic (congestive) heart failure: Secondary | ICD-10-CM

## 2024-01-27 DIAGNOSIS — J9622 Acute and chronic respiratory failure with hypercapnia: Secondary | ICD-10-CM | POA: Diagnosis not present

## 2024-01-27 DIAGNOSIS — Z9889 Other specified postprocedural states: Secondary | ICD-10-CM

## 2024-01-27 DIAGNOSIS — J9621 Acute and chronic respiratory failure with hypoxia: Secondary | ICD-10-CM

## 2024-01-27 DIAGNOSIS — I998 Other disorder of circulatory system: Secondary | ICD-10-CM

## 2024-01-27 LAB — GLUCOSE, CAPILLARY
Glucose-Capillary: 192 mg/dL — ABNORMAL HIGH (ref 70–99)
Glucose-Capillary: 230 mg/dL — ABNORMAL HIGH (ref 70–99)
Glucose-Capillary: 285 mg/dL — ABNORMAL HIGH (ref 70–99)
Glucose-Capillary: 338 mg/dL — ABNORMAL HIGH (ref 70–99)
Glucose-Capillary: 217 mg/dL — ABNORMAL HIGH (ref 70–99)

## 2024-01-27 LAB — BLOOD GAS, VENOUS
Acid-Base Excess: 12 mmol/L — ABNORMAL HIGH (ref 0.0–2.0)
Bicarbonate: 40.2 mmol/L — ABNORMAL HIGH (ref 20.0–28.0)
O2 Saturation: 41.6 %
Patient temperature: 37
pCO2, Ven: 68 mmHg — ABNORMAL HIGH (ref 44–60)
pH, Ven: 7.38 (ref 7.25–7.43)

## 2024-01-27 LAB — CBC WITH DIFFERENTIAL/PLATELET
Abs Immature Granulocytes: 0.05 10*3/uL (ref 0.00–0.07)
Basophils Absolute: 0 10*3/uL (ref 0.0–0.1)
Basophils Relative: 0 %
Eosinophils Absolute: 0 10*3/uL (ref 0.0–0.5)
Eosinophils Relative: 0 %
HCT: 31.6 % — ABNORMAL LOW (ref 36.0–46.0)
Hemoglobin: 10.1 g/dL — ABNORMAL LOW (ref 12.0–15.0)
Immature Granulocytes: 0 %
Lymphocytes Relative: 6 %
Lymphs Abs: 0.7 10*3/uL (ref 0.7–4.0)
MCH: 29.2 pg (ref 26.0–34.0)
MCHC: 32 g/dL (ref 30.0–36.0)
MCV: 91.3 fL (ref 80.0–100.0)
Monocytes Absolute: 0.9 10*3/uL (ref 0.1–1.0)
Monocytes Relative: 8 %
Neutro Abs: 10.1 10*3/uL — ABNORMAL HIGH (ref 1.7–7.7)
Neutrophils Relative %: 86 %
Platelets: 256 10*3/uL (ref 150–400)
RBC: 3.46 MIL/uL — ABNORMAL LOW (ref 3.87–5.11)
RDW: 14.1 % (ref 11.5–15.5)
WBC: 11.8 10*3/uL — ABNORMAL HIGH (ref 4.0–10.5)
nRBC: 0 % (ref 0.0–0.2)

## 2024-01-27 LAB — MAGNESIUM: Magnesium: 1.8 mg/dL (ref 1.7–2.4)

## 2024-01-27 LAB — TROPONIN I (HIGH SENSITIVITY)
Troponin I (High Sensitivity): 50 ng/L — ABNORMAL HIGH (ref <18)
Troponin I (High Sensitivity): 133 ng/L (ref <18)

## 2024-01-27 LAB — D-DIMER, QUANTITATIVE: D-Dimer, Quant: 2.36 ug{FEU}/mL — ABNORMAL HIGH (ref 0.00–0.50)

## 2024-01-27 LAB — BRAIN NATRIURETIC PEPTIDE: B Natriuretic Peptide: 47.8 pg/mL (ref 0.0–100.0)

## 2024-01-27 MED ORDER — ALBUTEROL SULFATE (2.5 MG/3ML) 0.083% IN NEBU
2.5000 mg | INHALATION_SOLUTION | Freq: Once | RESPIRATORY_TRACT | Status: AC
  Administered 2024-01-27: 2.5 mg via RESPIRATORY_TRACT

## 2024-01-27 MED ORDER — DIPHENHYDRAMINE HCL 25 MG PO CAPS
50.0000 mg | ORAL_CAPSULE | Freq: Once | ORAL | Status: AC
  Filled 2024-01-27: qty 2

## 2024-01-27 MED ORDER — DIPHENHYDRAMINE HCL 50 MG/ML IJ SOLN
50.0000 mg | Freq: Once | INTRAMUSCULAR | Status: AC
  Administered 2024-01-28: 50 mg via INTRAVENOUS
  Filled 2024-01-27: qty 1

## 2024-01-27 MED ORDER — SODIUM CHLORIDE 0.9 % IV SOLN
100.0000 mg | Freq: Two times a day (BID) | INTRAVENOUS | Status: DC
  Administered 2024-01-28: 100 mg via INTRAVENOUS
  Filled 2024-01-27 (×2): qty 100

## 2024-01-27 MED ORDER — DIPHENHYDRAMINE HCL 25 MG PO CAPS
50.0000 mg | ORAL_CAPSULE | Freq: Once | ORAL | Status: DC
Start: 2024-01-28 — End: 2024-01-27

## 2024-01-27 MED ORDER — METHYLPREDNISOLONE SODIUM SUCC 40 MG IJ SOLR
40.0000 mg | Freq: Once | INTRAMUSCULAR | Status: AC
  Administered 2024-01-27: 40 mg via INTRAVENOUS
  Filled 2024-01-27: qty 1

## 2024-01-27 MED ORDER — SODIUM CHLORIDE 0.9 % IV SOLN
1.0000 g | INTRAVENOUS | Status: DC
  Administered 2024-01-28: 1 g via INTRAVENOUS
  Filled 2024-01-27: qty 10

## 2024-01-27 MED ORDER — FUROSEMIDE 10 MG/ML IJ SOLN
INTRAMUSCULAR | Status: AC
  Filled 2024-01-27: qty 4

## 2024-01-27 MED ORDER — FUROSEMIDE 10 MG/ML IJ SOLN
20.0000 mg | Freq: Once | INTRAMUSCULAR | Status: AC
  Administered 2024-01-27: 20 mg via INTRAVENOUS

## 2024-01-27 MED ORDER — ALBUTEROL SULFATE (2.5 MG/3ML) 0.083% IN NEBU
2.5000 mg | INHALATION_SOLUTION | RESPIRATORY_TRACT | Status: DC
  Administered 2024-01-27 – 2024-01-28 (×8): 2.5 mg via RESPIRATORY_TRACT
  Filled 2024-01-27 (×9): qty 3

## 2024-01-27 MED ORDER — DIPHENHYDRAMINE HCL 50 MG/ML IJ SOLN
50.0000 mg | Freq: Once | INTRAMUSCULAR | Status: DC
Start: 2024-01-28 — End: 2024-01-27

## 2024-01-27 MED ORDER — INSULIN ASPART 100 UNIT/ML IJ SOLN
0.0000 [IU] | Freq: Three times a day (TID) | INTRAMUSCULAR | Status: DC
  Administered 2024-01-27: 5 [IU] via SUBCUTANEOUS
  Filled 2024-01-27: qty 1

## 2024-01-27 NOTE — Significant Event (Signed)
 Rapid Response Event Note   Reason for Call : Respiratory Distress   Initial Focused Assessment: Upon arrival to room. Patient is sitting in bed >30 degrees, eyes closed, audible wheezing, tachypnea, and accessory muscle use present. Patient is responsive to voice but lethargic. Dayshift RN at bedside who reports that patient has been having increasing O2 demands especially with ambulation. Patient had recently ambulated to [facility] and when RN came back in room patient was found on commode in respiratory distress with O2 saturation in the 70s. RN had contacted Dr. Vonna Guardian who placed consult to hospitalist.   Respiratory at bedside preparing to place patient on Bipap. Elisabeth Guild, NP at bedside.  Current vitals HR 115, O2 98% on 36% Bipap, BP 123/88    Interventions: Bipap, breathing treatment   Plan of Care: Stat chest x-ray, Venous blood gas, BNP, D-dimer, transfer to progressive care unit.    Event Summary: As above.   MD Notified: Dr. Vonna Guardian Call [phone] Arrival Time:1940 End Time:2000  Gelene Kelly, RN

## 2024-01-27 NOTE — Assessment & Plan Note (Addendum)
-?   Disconnected oxygen  tubing -? aspiration /microaspiration not yet seen on imaging -Possible COPD exacerbation, superimposed on OSA CTA chest negative for PE, infiltrate or fluid overload-stable from prior CT and chest x-ray Patient wears 3 L at baseline and CPAP at night BNP normal and no evidence of interstitial edema to suggest CHF.  No infiltrate to suggest pneumonia Continue BiPAP and wean as tolerated DuoNeb as needed Hold off on antibiotics SLP consult.  Will keep n.p.o. until then

## 2024-01-27 NOTE — Consult Note (Addendum)
 Initial Consultation Note   Patient: Angela Jensen ZOX:[id number] DOB: [birth year] PCP: Laurence Pons, NP DOA: 01/17/2024 DOS: the patient was seen and examined on 01/27/2024 Primary service: [employer]so [employer], MD  Referring physician: Dr Vonna Guardian Reason for consult:  Shortness of breath and rapid response  Assessment/Plan: Assessment and Plan: Acute on chronic respiratory failure with hypoxia and hypercapnia (HCC) -? Disconnected oxygen  tubing -? aspiration /microaspiration not yet seen on imaging -Possible COPD exacerbation, superimposed on OSA CTA chest negative for PE, infiltrate or fluid overload-stable from prior CT and chest x-ray Patient wears 3 L at baseline and CPAP at night BNP normal and no evidence of interstitial edema to suggest CHF.  No infiltrate to suggest pneumonia Continue BiPAP and wean as tolerated DuoNeb as needed Hold off on antibiotics SLP consult.  Will keep n.p.o. until then  Elevated troponin Troponin 50-->133-->261, EKG nonacute, patient without chest pain Recently had cardiac clearance preoperatively on 6/2 Suspecting demand ischemia from acute respiratory distress Echocardiogram to evaluate for segmental wall motion abnormality Cardiology consulted  S/p femoral endarterectomy 01/21/2024 S/p left femoral endarterectomy, left iliac stent, and left SFA, popliteal, and posterior tibial interventions  Atherosclerosis on rest pain/ischemic limb Management per vascular  SSS (sick sinus syndrome) s/p leadless pacemaker (HCC) No acute issues suspected  Chronic heart failure with preserved ejection fraction (HFpEF) (HCC) CTA chest not showing evidence of fluid overload, BNP normal Clinically euvolemic Continue home GDMT  Cancer of lower lobe of right lung [facility]) Patient is s/p XRT 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  HPI: Angela Jensen is a 68 y.o. female with past medical  history of   SSS s/p micra (06/2021), Lung cancer s/p XRT, hx of recurrent pneumothorax, hypertension,  chronic HFpEF, HTN,  IDDM,, COPD/asthma (baseline 2L O2), OSA,, tobacco use,  PVD s/p left femoral endarterectomy 01/21/24 (prior preop cardiac clearance on 6/5) now receiving  PT/OT without event, being seen in consultation for shortness of breath, resulting in a rapid response on the evening of 01/27/2024.  Review of documentation reveals that she started having pink sputum and inspiratory and respiratory wheezing earlier in the day which gradually worsened until around 7 PM  when patient yelled out for help saying she could not breathe and she was noted to be tachypneic, diaphoretic and anxious.  Oxygen  tubing was noted to be disconnected, with O2 sat in the 70s and heart rate in the 100-130s.  Oxygen  was connected and dialed up to 6 L with improvement in sats to the mid 90s but patient remains symptomatic.  Discussed with NP Boston Byers who responded to rapid response.  Essentially she stated patient needed BiPAP and further workup including CTA has been ordered. Formally spoke with Dr. Vonna Guardian via secure chat and urgent consult requested.   Review of Systems: As mentioned in the history of present illness. All other systems reviewed and are negative. Past Medical History:  Diagnosis Date   Anemia    Arthritis    Asthma    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/2016   Cancer (HCC) 2012   Right Lung CA   COPD (chronic obstructive pulmonary disease) (HCC)    Depression    Diabetes mellitus without complication (HCC)    Patient takes Janumet   Essential hypertension 05/26/2016   Heart failure (HCC) 2022   Hydropneumothorax 05/03/2020   Hypercholesteremia    Oxygen   dependent    2L at nite    PAD (peripheral artery disease) (HCC) 06/22/2016   Peripheral vascular disease (HCC)    Personal history of radiation therapy    Shortness of breath dyspnea     with exertion    Sialolithiasis    Sleep apnea    Wears dentures    full upper and lower   Past Surgical History:  Procedure Laterality Date   APPLICATION OF CELL SAVER Left 01/21/2024   Procedure: APPLICATION OF CELL SAVER;  Surgeon: Celso College, MD;  Location: [facility];  Service: Vascular;  Laterality: Left;   CESAREAN SECTION     x3   COLONOSCOPY WITH PROPOFOL  N/A 06/25/2015   Procedure: COLONOSCOPY WITH PROPOFOL ;  Surgeon: Marnee Sink, MD;  Location: [facility];  Service: Endoscopy;  Laterality: N/A;   COLONOSCOPY WITH PROPOFOL  N/A 07/26/2020   Procedure: COLONOSCOPY WITH PROPOFOL ;  Surgeon: Marnee Sink, MD;  Location: [facility];  Service: Endoscopy;  Laterality: N/A;   CYST REMOVAL LEG     and on shoulder    ENDARTERECTOMY FEMORAL Left 01/21/2024   Procedure: LEFT COMMON, SUPERFICIAL FEMORAL AND PROFUNDA FEMORIS ENDARTERECTOMY WITH BOVINE PERICARDIAL PATCH ANGIOPLASTY;  Surgeon: Celso College, MD;  Location: [facility];  Service: Vascular;  Laterality: Left;   ESOPHAGOGASTRODUODENOSCOPY (EGD) WITH PROPOFOL  N/A 07/26/2020   Procedure: ESOPHAGOGASTRODUODENOSCOPY (EGD) WITH PROPOFOL ;  Surgeon: Marnee Sink, MD;  Location: [facility];  Service: Endoscopy;  Laterality: N/A;  Diabetic - oral meds   INSERTION OF ILIAC STENT Left 01/21/2024   Procedure: INSERTION, STENT, ARTERY, ILIAC;  Surgeon: Celso College, MD;  Location: [facility];  Service: Vascular;  Laterality: Left;  AND SFA STENTS   LOWER EXTREMITY ANGIOGRAPHY Left 09/29/2018   Procedure: LOWER EXTREMITY ANGIOGRAPHY;  Surgeon: Celso College, MD;  Location: [facility];  Service: Cardiovascular;  Laterality: Left;   LOWER EXTREMITY ANGIOGRAPHY Left 07/29/2023   Procedure: Lower Extremity Angiography;  Surgeon: Celso College, MD;  Location: [facility];  Service: Cardiovascular;  Laterality: Left;   LOWER EXTREMITY ANGIOGRAPHY Right 12/20/2023   Procedure: Lower Extremity Angiography;  Surgeon: Celso College, MD;  Location: [facility];  Service: Cardiovascular;  Laterality: Right;   LOWER EXTREMITY ANGIOGRAPHY Left 01/17/2024   Procedure: Lower Extremity Angiography;  Surgeon: Celso College, MD;  Location: [facility];  Service: Cardiovascular;  Laterality: Left;   LUNG BIOPSY  12/30/2011   has lung spots   PACEMAKER IMPLANT  07/14/2021   PACEMAKER LEADLESS INSERTION N/A 07/14/2021   Procedure: PACEMAKER LEADLESS INSERTION;  Surgeon: Percival Brace, MD;  Location: [facility];  Service: Cardiovascular;  Laterality: N/A;   PERIPHERAL VASCULAR CATHETERIZATION Left 06/01/2016   Procedure: Lower Extremity Angiography;  Surgeon: Celso College, MD;  Location: [facility];  Service: Cardiovascular;  Laterality: Left;   PERIPHERAL VASCULAR CATHETERIZATION N/A 06/01/2016   Procedure: Abdominal Aortogram w/Lower Extremity;  Surgeon: Celso College, MD;  Location: [facility];  Service: Cardiovascular;  Laterality: N/A;   PERIPHERAL VASCULAR CATHETERIZATION  06/01/2016   Procedure: Lower Extremity Intervention;  Surgeon: Celso College, MD;  Location: [facility];  Service: Cardiovascular;;   PERIPHERAL VASCULAR CATHETERIZATION Right 06/08/2016   Procedure: Lower Extremity Angiography;  Surgeon: Celso College, MD;  Location: [facility];  Service: Cardiovascular;  Laterality: Right;   PERIPHERAL VASCULAR CATHETERIZATION  06/08/2016   Procedure: Lower Extremity Intervention;  Surgeon: Celso College, MD;  Location: [facility];  Service: Cardiovascular;;   SUBMANDIBULAR GLAND EXCISION Left 12/06/2020   Procedure: EXCISION SUBMANDIBULAR GLAND;  Surgeon: Lesly Raspberry, MD;  Location: [facility];  Service: ENT;  Laterality: Left;  needs to be first case Diabetic - diet controlled   TEMPORARY PACEMAKER N/A 07/11/2021   Procedure: TEMPORARY PACEMAKER;  Surgeon: Percival Brace, MD;  Location: [facility];  Service:  Cardiovascular;  Laterality: N/A;   Social History:  reports that she quit smoking about 13 years ago. Her smoking use included cigarettes. She started smoking about 51 years ago. She has a 37 pack-year smoking history. She has quit using smokeless tobacco.  Her smokeless tobacco use included snuff. She reports that she does not currently use alcohol after a past usage of about 5.0 standard drinks of alcohol per week. She reports that she does not currently use drugs after having used the following drugs: Marijuana, Crack cocaine, and Cocaine.  Allergies  Allergen Reactions   Iodinated Contrast Media Itching   Trelegy Ellipta  [Fluticasone -Umeclidin-Vilant]     Had breathing issues   Bactoshield Chg [Chlorhexidine  Gluconate] Itching and Other (See Comments)    Dry. Flaking, peeling skin    Family History  Problem Relation Age of Onset   Diabetes Mother    Hypercholesterolemia Mother    Lung cancer Father    Diabetes Sister    Diabetes Sister    Hypertension Sister    Diabetes Maternal Grandmother    Diabetes Paternal Grandmother    Heart attack Brother    Coronary artery disease Brother    Vascular Disease Brother    Heart attack Brother    Breast cancer Neg Hx     Prior to Admission medications   Medication Sig Start Date End Date Taking? Authorizing Provider  albuterol  (VENTOLIN  HFA) 108 (90 Base) MCG/ACT inhaler INHALE 2 PUFFS BY MOUTH EVERY 6 HOURS AS NEEDED FOR WHEEZING OR SHORTNESS OF BREATH 10/13/23  Yes Abernathy, Alyssa, NP  ALPRAZolam  (XANAX ) 0.5 MG tablet Take 1 tablet (0.5 mg total) by mouth 2 (two) times daily as needed for anxiety. 01/12/24  Yes Abernathy, Janeice Medal, NP  aspirin  EC 81 MG tablet Take 1 tablet (81 mg total) by mouth daily. Swallow whole. 07/29/23 07/28/24 Yes Dew, Donald Frost, MD  atorvastatin  (LIPITOR) 10 MG tablet Take 1 tablet (10 mg total) by mouth daily. 07/29/23 07/28/24 Yes Dew, Donald Frost, MD  calcium  carbonate (OSCAL) 1500 (600 Ca) MG TABS tablet Take  600 mg of elemental calcium  by mouth 2 (two) times daily with a meal.   Yes [provider]  clopidogrel  (PLAVIX ) 75 MG tablet Take 1 tablet (75 mg total) by mouth daily. 10/13/23  Yes Abernathy, Janeice Medal, NP  cyclobenzaprine  (FLEXERIL ) 10 MG tablet Take 1 tablet (10 mg total) by mouth at bedtime. Take one tab po qhs for back spasm prn only 10/13/23  Yes Abernathy, Alyssa, NP  escitalopram  (LEXAPRO ) 10 MG tablet Take 1 tablet (10 mg total) by mouth daily. 08/12/23  Yes Abernathy, Janeice Medal, NP  ferrous sulfate  325 (65 FE) MG tablet Take 325 mg by mouth 2 (two) times daily with a meal.   Yes [provider]  furosemide  (LASIX ) 20 MG tablet Take 1 tablet (20 mg total) by mouth daily as needed. 04/27/23  Yes Abernathy, Alyssa, NP  gabapentin  (NEURONTIN ) 300 MG capsule TAKE 1 CAPSULE BY MOUTH THREE TIMES DAILY 12/15/23  Yes Abernathy, Alyssa, NP  insulin  glargine, 1 Unit  Dial , (TOUJEO ) 300 UNIT/ML Solostar Pen Inject 10 Units into the skin daily. 07/27/23  Yes Abernathy, Alyssa, NP  ipratropium-albuterol  (DUONEB) 0.5-2.5 (3) MG/3ML SOLN USE 1 AMPULE IN NEBULIZER EVERY 4 HOURS AS NEEDED 12/22/23  Yes Abernathy, Alyssa, NP  lisinopril -hydrochlorothiazide  (ZESTORETIC ) 20-25 MG tablet Take 1 tablet by mouth daily. 10/13/23  Yes Abernathy, Alyssa, NP  metoprolol  succinate (TOPROL -XL) 25 MG 24 hr tablet Take 1 tablet (25 mg total) by mouth daily. 10/13/23  Yes Abernathy, Janeice Medal, NP  mometasone -formoterol  (DULERA ) 200-5 MCG/ACT AERO Inhale 2 puffs by mouth twice daily 07/18/23  Yes Khan, Fozia M, MD  potassium chloride  (KLOR-CON ) 10 MEQ tablet TAKE 1 TABLET BY MOUTH EVERY OTHER DAY 12/14/23  Yes Abernathy, Alyssa, NP  SPIRIVA  HANDIHALER 18 MCG inhalation capsule INHALE THE CONTENTS OF 1 CAPSULES BY MOUTH ONCE DAILY - DO NOT SWALLOW CAPSULES 06/28/23  Yes McDonough, Lauren K, PA-C  vitamin B-12 (CYANOCOBALAMIN ) 100 MCG tablet Take 100 mcg by mouth daily.   Yes [provider]  VITAMIN D ,  CHOLECALCIFEROL , PO Take 1 tablet by mouth in the morning.   Yes [provider]  ACCU-CHEK GUIDE TEST test strip USE TO TEST BLOOD SUGAR 5 TIMES DAILY 09/21/23   Laurence Pons, NP  Accu-Chek Softclix Lancets lancets Use 1 lancet 3 times daily to check glucose for diabetes 01/19/23   Laurence Pons, NP  Blood Glucose Monitoring Suppl (ACCU-CHEK GUIDE) w/Device KIT Use as directed Dx e11.65 04/11/21   Khan, Fozia M, MD  Continuous Blood Gluc Receiver (DEXCOM G7 RECEIVER) DEVI Use one as directed for uncontrolled dm 06/09/22   Laurence Pons, NP  Continuous Glucose Sensor (DEXCOM G7 SENSOR) MISC USE AS DIRECTED. CHANGE EVERY 10 DAYS. 09/21/23   Laurence Pons, NP  Ensifentrine  (OHTUVAYRE ) 3 MG/2.5ML SUSP Inhale 3 mg into the lungs in the morning and at bedtime. Patient not taking: Reported on 01/17/2024 04/30/23   Laurence Pons, NP  famotidine  (PEPCID ) 20 MG tablet Take 1 tablet (20 mg total) by mouth daily for 14 days. Patient taking differently: Take 20 mg by mouth daily as needed for heartburn. 06/14/21 12/20/23  Garnet Just, MD  Insulin  Pen Needle (RELION PEN NEEDLES) 31G X 6 MM MISC USE 1 PEN NEEDLE WITH INSULIN  PEN ONCE DAILY FOR DIABETES 05/05/23   Laurence Pons, NP  metFORMIN  (GLUCOPHAGE -XR) 500 MG 24 hr tablet TAKE 2 TABLETS BY MOUTH ONCE DAILY WITH BREAKFAST 01/17/24   Abernathy, Alyssa, NP  methylPREDNISolone  (MEDROL  DOSEPAK) 4 MG TBPK tablet  12/23/23   [provider]  OXYGEN  Inhale 4 L into the lungs. PT USES [facility] FOR OXYGEN     [provider]    Physical Exam: Vitals:   01/27/24 1543 01/27/24 1815 01/27/24 1945 01/27/24 2100  BP: (!) 151/83   107/74  Pulse: 100     Resp: 19 (!) 28    Temp: 98.9 F (37.2 C)   97.9 F (36.6 C)  TempSrc:    Axillary  SpO2: 97% 93% 96%   Weight:    59.9 kg  Height:    4' 11 (1.499 m)   Physical Exam Vitals and nursing note reviewed.  Constitutional:      General: She is not in acute distress.     Comments: Conversational dyspnea, lying flat, on BiPAP  HENT:     Head: Normocephalic and atraumatic.   Cardiovascular:     Rate and Rhythm: Regular rhythm. Tachycardia present.     Heart sounds: Normal heart sounds.  Pulmonary:  Effort: Tachypnea, accessory muscle usage and prolonged expiration present.     Breath sounds: Wheezing present.  Abdominal:     Palpations: Abdomen is soft.     Tenderness: There is no abdominal tenderness.   Neurological:     Mental Status: Mental status is at baseline.     Data Reviewed: Relevant notes from primary care and specialist visits, past discharge summaries as available in EHR, including Care Everywhere. Prior diagnostic testing as pertinent to current admission diagnoses Updated medications and problem lists for reconciliation ED course, including vitals, labs, imaging, treatment and response to treatment Triage notes, nursing and pharmacy notes and ED provider's notes Notable results as noted in HPI  Workup: VBG on BiPAP with pH 7.38 and PCO268 Troponin 50 and BNP 48 EKG showing sinus tachycardia without ischemia Chest x-ray nonacute, possibly layering pleural effusion on the right, right midlung with unchanged radiation fibrosis  CTA PE protocol pending---> nonacute   Family Communication: none Primary team communication: Claudell Cruz Thank you very much for involving us  in the care of your patient.  CRITICAL CARE Performed by: Lanetta Pion   Total critical care time: 70 minutes  Critical care time was exclusive of separately billable procedures and treating other patients.  Critical care was necessary to treat or prevent imminent or life-threatening deterioration.  Critical care was time spent personally by me on the following activities: development of treatment plan with patient and/or surrogate as well as nursing, discussions with consultants, evaluation of patient's response to treatment, examination of patient, obtaining  history from patient or surrogate, ordering and performing treatments and interventions, ordering and review of laboratory studies, ordering and review of radiographic studies, pulse oximetry and re-evaluation of patient's condition.  Author: Lanetta Pion, MD 01/27/2024 9:49 PM  For on call review [email or web address].

## 2024-01-27 NOTE — Progress Notes (Signed)
   01/27/24 1900  Spiritual Encounters  Type of Visit Initial  Care provided to: Pt not available  Referral source Code page  OnCall Visit Yes   Chaplain responded to rapid page. No family was present and patient is with care team. Chaplain is available for follow up as needed.

## 2024-01-27 NOTE — Progress Notes (Signed)
 Telephone call notification of unti change attempt to Myer Artis and Terri Paylor no answer. Ether Hercules notified of change in condition and unit transfer.

## 2024-01-27 NOTE — Progress Notes (Signed)
 Into follow up with pt request to decrease supplemental oxygen  pt expressing she is receiving too much oxygen  with her lungs and now making her feel more short of breath.  With education of allowing her body to reperfuse nasal breathing versus mouth pt continue to want oxygen  decreased anxious and diaphoretic. Oxygen  96% HR 120-130s @ 6L oxygen  levels trended down immediately with decreasing to 4L at 87% placed back on 6L nasal cannula. Reeducated patient on the need for increase amount of oxygen  and to breath in her nose and out her mouth versus mouth breathing. May improve her oxygenation and help decrease the need for higher levels of supplemental oxygen .  BS recheck to r/o hypoglycemia. Charge nurse in to eval and notified respiratory.

## 2024-01-27 NOTE — Assessment & Plan Note (Signed)
 S/p left femoral endarterectomy, left iliac stent, and left SFA, popliteal, and posterior tibial interventions  Atherosclerosis on rest pain/ischemic limb Management per vascular

## 2024-01-27 NOTE — Progress Notes (Signed)
 Occupational Therapy Treatment Patient Details Name: Angela Jensen MRN: [id number] DOB: [birth year] Today's Date: 01/27/2024   History of present illness Pt is a 68 y/o F admitted on 01/17/24 for LLE angiography. Pt is s/p LLE femoral endarterectomy with iliac stent placement on 01/21/24. PMH: asthma, lung CA, COPD, depression, DM, HTN, hypercholesterolemia, O2 dependent at night, PAD, Sialolithiasis, sleep apnea   OT comments  Pt making daily progress towards goals, able to self-pace activity throughout with PLB. SpO2 on 3L start of session, increase to 4L for mobility in hallway as SpO2 decreases to 82% on 3L. On 4L, SpO2 88% while walking and 92% with standing rest breaks. Functional mobility and transfers performed with supervision - mod independent using RW and assist for O2 lines 130 ft in hallway. Declines needs for ADL performance at this time. OT will continue to follow, discharge recommendation appropriate.       If plan is discharge home, recommend the following:  A little help with walking and/or transfers;A little help with bathing/dressing/bathroom   Equipment Recommendations  None recommended by OT       Precautions / Restrictions Precautions Precautions: Fall Recall of Precautions/Restrictions: Intact Precaution/Restrictions Comments: watch HR, O2 Restrictions Weight Bearing Restrictions Per Provider Order: No       Mobility Bed Mobility Overal bed mobility: Modified Independent Bed Mobility: Supine to Sit     Supine to sit: Modified independent (Device/Increase time)          Transfers Overall transfer level: Modified independent Equipment used: Rolling walker (2 wheels) Transfers: Sit to/from Stand Sit to Stand: Modified independent (Device/Increase time)     Step pivot transfers: Supervision           Balance Overall balance assessment: Modified Independent Sitting-balance support: Feet supported, No upper extremity supported Sitting balance-Leahy  Scale: Good Sitting balance - Comments: Steady reaching with BOS   Standing balance support: Bilateral upper extremity supported, During functional activity Standing balance-Leahy Scale: Good                             ADL either performed or assessed with clinical judgement   ADL Overall ADL's : Needs assistance/impaired                                       General ADL Comments: Declines ADL participation, requests to work on functional mobility to improve activity tolerance     Communication Communication Communication: No apparent difficulties Factors Affecting Communication: Hearing impaired   Cognition Arousal: Alert Behavior During Therapy: WFL for tasks assessed/performed Cognition: No family/caregiver present to determine baseline                               Following commands: Intact        Cueing   Cueing Techniques: Verbal cues        General Comments SpO2 on 3L start of session, increase to 4L for mobility in hallway as SpO2 decreases to 82%. On 4L, SpO2 88% while walking and 92% with standing rest breaks.    Pertinent Vitals/ Pain       Pain Assessment Pain Assessment: No/denies pain  Home Living  Prior Functioning/Environment              Frequency  Min 2X/week        Progress Toward Goals  OT Goals(current goals can now be found in the care plan section)  Progress towards OT goals: Progressing toward goals  Acute Rehab OT Goals OT Goal Formulation: With patient Time For Goal Achievement: 02/05/24 Potential to Achieve Goals: Good  Plan         AM-PAC OT 6 Clicks Daily Activity     Outcome Measure   Help from another person eating meals?: None Help from another person taking care of personal grooming?: None Help from another person toileting, which includes using toliet, bedpan, or urinal?: A Little Help from another person  bathing (including washing, rinsing, drying)?: A Little Help from another person to put on and taking off regular upper body clothing?: None Help from another person to put on and taking off regular lower body clothing?: A Little 6 Click Score: 21    End of Session Equipment Utilized During Treatment: Gait belt;Rolling walker (2 wheels);Oxygen   OT Visit Diagnosis: Unsteadiness on feet (R26.81);Other abnormalities of gait and mobility (R26.89)   Activity Tolerance Patient tolerated treatment well   Patient Left in chair;with call bell/phone within reach;with chair alarm set   Nurse Communication Mobility status        Time: 4098-1191 OT Time Calculation (min): 11 min  Charges: OT General Charges $OT Visit: 1 Visit OT Treatments $Therapeutic Activity: 8-22 mins  Jaryah Aracena L. Chike Farrington, OTR/L  01/27/24, 12:44 PM

## 2024-01-27 NOTE — Significant Event (Signed)
 CROSS COVER NOTE  NAME: Angela Jensen MRN: [id number] DOB : 01-29-1956 ATTENDING PHYSICIAN: Celso [employer], MD    Date of Service   01/27/2024   HPI/Events of Note   Rapid response was called secondary to hypoxic diaphoretic event. 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 HTN, mixed hyperlipidemia and OSA (said she has CPAP at home and uses  but respiratory therapist reports history of non compliance)  Interventions   Assessment/Plan:    01/27/2024    3:43 PM 01/27/2024    7:34 AM 01/27/2024    5:06 AM  Vitals with BMI  Systolic 151 128 409  Diastolic 83 77 93  Pulse 100 81 84   Patient sleepy but eyes open to name oriented MAE Significant work of breathing with wheezing bilateral inspiratory and expiratory with crackles in bases BIPAP placed and patient admits much relief with breathing effort. Sats had recovered prior to BIPAP placement, and remain 98% with 36% FiO2  12/6. TV volumes good  Latest Reference Range & Units 01/27/24 20:05  pH, Ven 7.25 - 7.43  7.38  pCO2, Ven 44 - 60 mmHg 68 (H)  Acid-Base Excess 0.0 - 2.0 mmol/L 12.0 (H)  Bicarbonate 20.0 - 28.0 mmol/L 40.2 (H)  O2 Saturation % 41.6  Patient temperature  37.0  Collection site  VEIN  (H): Data is abnormally high  Latest Reference Range & Units 01/27/24 20:05  B Natriuretic Peptide 0.0 - 100.0 pg/mL 47.8  Troponin I (High Sensitivity) <18 ng/L 50 (H)  (H): Data is abnormally high  Latest Reference Range & Units 01/25/24  03:13 01/27/24 22:05  WBC 4.0 - 10.5 K/uL 8.4 11.8 (H)  RBC 3.87 - 5.11 MIL/uL 3.37 (L) 3.46 (L)  Hemoglobin 12.0 - 15.0 g/dL 81.1 (L) 91.4 (L)  HCT 36.0 - 46.0 % 31.3 (L) 31.6 (L)  MCV 80.0 - 100.0 fL 92.9 91.3  MCH 26.0 - 34.0 pg 29.7 29.2  MCHC 30.0 - 36.0 g/dL 78.2 95.6  RDW 21.3 - 08.6 % 14.7 14.1  Platelets 150 - 400 K/uL 170 256  nRBC 0.0 - 0.2 % 0.0 0.0  Neutrophils %  86  Lymphocytes %  6  Monocytes Relative %  8  Eosinophil %  0  Basophil %  0  Immature Granulocytes %  0  NEUT# 1.7 - 7.7 K/uL  10.1 (H)  Lymphs Abs 0.7 - 4.0 K/uL  0.7  Monocyte # 0.1 - 1.0 K/uL  0.9  Eosinophils Absolute 0.0 - 0.5 K/uL  0.0  Basophils Absolute 0.0 - 0.1 K/uL  0.0  Abs Immature Granulocytes 0.00 - 0.07 K/uL  0.05  (H): Data is abnormally high (L): Data is abnormally low  EKG reviewed by me ST non significant STE II and AVF, RBBB QTC 490 (bedside monitor 500) Compared to prior EKG STE looks similar and RBBB present as well  Chest xray rad report IMPRESSION: 1. Slightly increased right basilar opacity could reflect atelectasis, however, a small layering pleural effusion can not be excluded. 2.  Similar right mid lung density, most compatible with radiation fibrosis as noted on the prior CT.  Acute on chronic respiratory failure with hypoxia and hypercapnia BIPAP support, wean as tolerate CTA chest PE study with solu medrol  and benadryl  prior due to contrast dye itch reaction history Doxycycline  and rocephin  + Q 12 h solumedrol for COPD exacerbation    Elevated Troponin Likely demand Serial troponins ordered and repeat EKG  Prolonged QT  Avoid QT prolonging agents 2 gm mag (mag level 1.8) goal above 2 and K above 4 (4.2)   Hyperglycemia Takes troujeo at home. Per home list Increase SSI to high dose range given steroids  CRITICAL CARE Performed by: Elisabeth Guild   Total critical care time: 60 minutes  Critical care time was exclusive of separately billable procedures  and treating other patients.  Critical care was necessary to treat or prevent imminent or life-threatening deterioration.  Critical care was time spent personally by me on the following activities: development of treatment plan with patient and/or surrogate as well as nursing, discussions with consultants, evaluation of patient's response to treatment, examination of patient, obtaining history from patient or surrogate, ordering and performing treatments and interventions, ordering and review of laboratory studies, ordering and review of radiographic studies, pulse oximetry and re-evaluation of patient's condition.        Kip Peon NP [facility] Cross Cover 7pm-7am - check amion for availability Pager [phone]

## 2024-01-27 NOTE — Progress Notes (Signed)
 patient is expressing concerns of sputum having a pink tinge. Noted sputum tan and thick with light pink to red color.  Adventitious lungs with inspiratory and expiratory wheezing. Pt is reporting feel no noted change in her breathing or oxygen  needs but stated the pink tinge is new and concerning. MD Dew notified. New orders received from NP Pace. Discussed with pt. Pt receptive of poc.

## 2024-01-27 NOTE — Assessment & Plan Note (Signed)
 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

## 2024-01-27 NOTE — Plan of Care (Signed)
  Problem: Education: Goal: Knowledge of General Education information will improve Description: Including pain rating scale, medication(s)/side effects and non-pharmacologic comfort measures Outcome: Progressing   Problem: Clinical Measurements: Goal: Ability to maintain clinical measurements within normal limits will improve Outcome: Progressing Goal: Will remain free from infection Outcome: Progressing Goal: Diagnostic test results will improve Outcome: Progressing Goal: Respiratory complications will improve Outcome: Progressing Goal: Cardiovascular complication will be avoided Outcome: Progressing   Problem: Health Behavior/Discharge Planning: Goal: Ability to manage health-related needs will improve Outcome: Progressing   Problem: Nutrition: Goal: Adequate nutrition will be maintained Outcome: Progressing   Problem: Activity: Goal: Risk for activity intolerance will decrease Outcome: Progressing   Problem: Coping: Goal: Level of anxiety will decrease Outcome: Progressing

## 2024-01-27 NOTE — Progress Notes (Signed)
 MD Dew notified of pt condition. Pt continue to be restless fatigue in appearance tachypneic tachycardic. Reinterate use of incentive spirometer use every hour with patient as directed by MD. Vergia Glasgow order for hospitalist consult for medical management.  RT into eval with poc for prn respiratory treatment and recommendation of MD to eval and Bipap versus cpap. Per protocol if continuous will require transferring to PCU. MD Dew notified and in agreement. RT reports will place orders.

## 2024-01-27 NOTE — Progress Notes (Signed)
 Pt titrated to 5L via RT oxygenation 96% HR 90 - low 100s. awaiting Bipap placement. However pt respirations is rapid with more effort and use of abdominal muscles. Lungs audible standing at bedside without auscultation. Pt require stimulation to wake up compared to usual open eyes and start talking once open door and enter room.  Unsure if related to xanax  administered for anxiety. Per MD not in house and unable to eval patient at bedside. Rapid called to beside for further evaluation.

## 2024-01-27 NOTE — Progress Notes (Signed)
 Physical Therapy Treatment Patient Details Name: Angela Jensen MRN: [id number] DOB: [birth year] Today's Date: 01/27/2024   History of Present Illness Pt is a 68 y/o F admitted on 01/17/24 for LLE angiography. Pt is s/p LLE femoral endarterectomy with iliac stent placement on 01/21/24. PMH: asthma, lung CA, COPD, depression, DM, HTN, hypercholesterolemia, O2 dependent at night, PAD, Sialolithiasis, sleep apnea    PT Comments  Pt on BSC awaiting bath.  Supervised and assisted as needed with back and feet only.  No LOB noted even with SLS or while putting one foot up on BSC for pericare.  She self paces and takes seated rests as needed.  After bathing she is able to walk 120' with RW and mod I with assist for O2 tank management.    Pt continues to desat with mobility to high 70's/low 80's with some increased HR to high 110's.  Stated she does desat at home and has pulse oxymeter to monitor at home and will desat to 70's with activity but recovers fairly quickly with seated rest and deep breathing.     If plan is discharge home, recommend the following: Assist for transportation;Help with stairs or ramp for entrance;Assistance with cooking/housework   Can travel by private vehicle        Equipment Recommendations       Recommendations for Other Services       Precautions / Restrictions Precautions Precautions: Fall Recall of Precautions/Restrictions: Intact Precaution/Restrictions Comments: watch HR, O2 Restrictions Weight Bearing Restrictions Per Provider Order: No     Mobility  Bed Mobility Overal bed mobility: Modified Independent         Sit to supine: Modified independent (Device/Increase time)     Patient Response: Cooperative  Transfers Overall transfer level: Modified independent Equipment used: Rolling walker (2 wheels), None Transfers: Sit to/from Stand Sit to Stand: Modified independent (Device/Increase time)                Ambulation/Gait Ambulation/Gait  assistance: Modified independent (Device/Increase time), Supervision Gait Distance (Feet): 120 Feet Assistive device: Rolling walker (2 wheels)   Gait velocity: decreased     General Gait Details: assist for O2 tank but does well with no LOB or buckling   Stairs             Wheelchair Mobility     Tilt Bed Tilt Bed Patient Response: Cooperative  Modified Rankin (Stroke Patients Only)       Balance Overall balance assessment: Modified Independent                                          Communication Communication Communication: No apparent difficulties  Cognition Arousal: Alert Behavior During Therapy: WFL for tasks assessed/performed   PT - Cognitive impairments: No apparent impairments                         Following commands: Intact      Cueing Cueing Techniques: Verbal cues  Exercises Other Exercises Other Exercises: bathing in standing with no LOB noted despite multiple single leg stances.  seated rests as needed.    General Comments        Pertinent Vitals/Pain Pain Assessment Pain Assessment: No/denies pain    Home Living  Prior Function            PT Goals (current goals can now be found in the care plan section) Progress towards PT goals: Progressing toward goals    Frequency    Min 2X/week      PT Plan      Co-evaluation              AM-PAC PT 6 Clicks Mobility   Outcome Measure  Help needed turning from your back to your side while in a flat bed without using bedrails?: None Help needed moving from lying on your back to sitting on the side of a flat bed without using bedrails?: None Help needed moving to and from a bed to a chair (including a wheelchair)?: None Help needed standing up from a chair using your arms (e.g., wheelchair or bedside chair)?: None Help needed to walk in hospital room?: None Help needed climbing 3-5 steps with a railing? : A  Little 6 Click Score: 23    End of Session Equipment Utilized During Treatment: Oxygen  Activity Tolerance: Patient tolerated treatment well Patient left: in bed;with bed alarm set;with call bell/phone within reach Nurse Communication: Mobility status PT Visit Diagnosis: Pain;Muscle weakness (generalized) (M62.81);Difficulty in walking, not elsewhere classified (R26.2) Pain - Right/Left: Left Pain - part of body: Leg     Time: 0927-0953 PT Time Calculation (min) (ACUTE ONLY): 26 min  Charges:    $Gait Training: 8-22 mins $Therapeutic Activity: 8-22 mins PT General Charges $$ ACUTE PT VISIT: 1 Visit                   Charlanne Cong, PTA 01/27/24, 10:09 AM

## 2024-01-27 NOTE — Progress Notes (Signed)
 Writer entered room responding to patient yelling please help me I'm out of breath. Noted patient on bedside commode diaphoretic anxious rapid mouth breathing nasal cannula in nose oxygen  on 3L however tubing not connected to oxygen  connection of wall. Pt had dislodged transferring from bed to [facility].  Oxygen  in 70s HR 100-130s. Oxygen  reconnected emotional supported provided encouraged pt to relax and breath in her nose and out her mouth. Oxygen  titration required and @ 6L to obtain oxygen  saturation greater than 90%.

## 2024-01-28 ENCOUNTER — Inpatient Hospital Stay

## 2024-01-28 DIAGNOSIS — I5032 Chronic diastolic (congestive) heart failure: Secondary | ICD-10-CM | POA: Diagnosis not present

## 2024-01-28 DIAGNOSIS — I998 Other disorder of circulatory system: Secondary | ICD-10-CM | POA: Diagnosis not present

## 2024-01-28 DIAGNOSIS — J9621 Acute and chronic respiratory failure with hypoxia: Secondary | ICD-10-CM | POA: Diagnosis not present

## 2024-01-28 DIAGNOSIS — R7989 Other specified abnormal findings of blood chemistry: Secondary | ICD-10-CM

## 2024-01-28 DIAGNOSIS — I495 Sick sinus syndrome: Secondary | ICD-10-CM

## 2024-01-28 LAB — TROPONIN I (HIGH SENSITIVITY)
Troponin I (High Sensitivity): 261 ng/L (ref <18)
Troponin I (High Sensitivity): 235 ng/L (ref <18)
Troponin I (High Sensitivity): 226 ng/L (ref <18)

## 2024-01-28 LAB — PROCALCITONIN: Procalcitonin: <0.1 ng/mL

## 2024-01-28 LAB — GLUCOSE, CAPILLARY
Glucose-Capillary: 247 mg/dL — ABNORMAL HIGH (ref 70–99)
Glucose-Capillary: 318 mg/dL — ABNORMAL HIGH (ref 70–99)
Glucose-Capillary: 311 mg/dL — ABNORMAL HIGH (ref 70–99)
Glucose-Capillary: 325 mg/dL — ABNORMAL HIGH (ref 70–99)

## 2024-01-28 MED ORDER — LOSARTAN POTASSIUM 25 MG PO TABS
25.0000 mg | ORAL_TABLET | Freq: Every day | ORAL | Status: DC
  Administered 2024-01-28 – 2024-02-02 (×6): 25 mg via ORAL
  Filled 2024-01-28 (×6): qty 1

## 2024-01-28 MED ORDER — IOHEXOL 350 MG/ML SOLN
100.0000 mL | Freq: Once | INTRAVENOUS | Status: AC | PRN
  Administered 2024-01-28: 100 mL via INTRAVENOUS

## 2024-01-28 MED ORDER — METHYLPREDNISOLONE SODIUM SUCC 125 MG IJ SOLR: 80.0000 mg | INTRAMUSCULAR | Status: DC

## 2024-01-28 MED ORDER — MAGNESIUM SULFATE 2 GM/50ML IV SOLN
2.0000 g | Freq: Once | INTRAVENOUS | Status: AC
  Administered 2024-01-28: 2 g via INTRAVENOUS
  Filled 2024-01-28: qty 50

## 2024-01-28 MED ORDER — ALBUTEROL SULFATE (2.5 MG/3ML) 0.083% IN NEBU
2.5000 mg | INHALATION_SOLUTION | Freq: Four times a day (QID) | RESPIRATORY_TRACT | Status: DC
  Administered 2024-01-28 – 2024-02-02 (×19): 2.5 mg via RESPIRATORY_TRACT
  Filled 2024-01-28 (×18): qty 3

## 2024-01-28 MED ORDER — PREDNISONE 20 MG PO TABS
20.0000 mg | ORAL_TABLET | Freq: Every day | ORAL | Status: AC
  Administered 2024-01-29 – 2024-02-01 (×4): 20 mg via ORAL
  Filled 2024-01-28 (×4): qty 1

## 2024-01-28 MED ORDER — INSULIN ASPART 100 UNIT/ML IJ SOLN
0.0000 [IU] | Freq: Three times a day (TID) | INTRAMUSCULAR | Status: DC
  Administered 2024-01-28: 7 [IU] via SUBCUTANEOUS
  Administered 2024-01-28 (×2): 15 [IU] via SUBCUTANEOUS
  Administered 2024-01-28: 7 [IU] via SUBCUTANEOUS
  Administered 2024-01-29: 4 [IU] via SUBCUTANEOUS
  Administered 2024-01-29: 7 [IU] via SUBCUTANEOUS
  Administered 2024-01-29: 11 [IU] via SUBCUTANEOUS
  Administered 2024-01-29: 7 [IU] via SUBCUTANEOUS
  Administered 2024-01-30: 11 [IU] via SUBCUTANEOUS
  Administered 2024-01-30: 7 [IU] via SUBCUTANEOUS
  Administered 2024-01-30: 15 [IU] via SUBCUTANEOUS
  Administered 2024-01-30 – 2024-01-31 (×2): 4 [IU] via SUBCUTANEOUS
  Administered 2024-01-31: 11 [IU] via SUBCUTANEOUS
  Administered 2024-01-31: 7 [IU] via SUBCUTANEOUS
  Administered 2024-01-31: 11 [IU] via SUBCUTANEOUS
  Administered 2024-02-01: 7 [IU] via SUBCUTANEOUS
  Administered 2024-02-01: 11 [IU] via SUBCUTANEOUS
  Administered 2024-02-01: 4 [IU] via SUBCUTANEOUS
  Administered 2024-02-01: 15 [IU] via SUBCUTANEOUS
  Administered 2024-02-02: 20 [IU] via SUBCUTANEOUS
  Filled 2024-01-28 (×20): qty 1

## 2024-01-28 MED ORDER — METOPROLOL SUCCINATE ER 50 MG PO TB24
50.0000 mg | ORAL_TABLET | Freq: Every day | ORAL | Status: DC
  Administered 2024-01-29 – 2024-02-02 (×5): 50 mg via ORAL
  Filled 2024-01-28 (×5): qty 1

## 2024-01-28 MED ORDER — METHYLPREDNISOLONE SODIUM SUCC 40 MG IJ SOLR
40.0000 mg | INTRAMUSCULAR | Status: DC
  Administered 2024-01-28: 40 mg via INTRAVENOUS
  Filled 2024-01-28: qty 1

## 2024-01-28 MED ORDER — DOXYCYCLINE HYCLATE 100 MG PO TABS: 100.0000 mg | ORAL_TABLET | Freq: Two times a day (BID) | ORAL | Status: DC

## 2024-01-28 NOTE — Plan of Care (Signed)

## 2024-01-28 NOTE — Progress Notes (Signed)
 Physical Therapy Treatment Patient Details Name: Angela Jensen MRN: [id number] DOB: 25-Sep-1955 Today's Date: 01/28/2024   History of Present Illness Pt is a 68 y/o F admitted on 01/17/24 for LLE angiography. Pt is s/p LLE femoral endarterectomy with iliac stent placement on 01/21/24. 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 She is eager to walk. Patient complains of mild left groin pain intermittently and mostly with hip flexion. Hallway ambulation performed with rolling walker on 3 L02. Slow but steady with Sp02 88% or above. Encouraged energy conservation strategies as needed. Patient is eager for eventual return home. PT will continue to follow.    If plan is discharge home, recommend the following: Assist for transportation;Help with stairs or ramp for entrance;Assistance with cooking/housework   Can travel by private vehicle        Equipment Recommendations  None recommended by PT    Recommendations for Other Services       Precautions / Restrictions Precautions Precautions: Fall Recall of Precautions/Restrictions: Intact Precaution/Restrictions Comments: watch HR, O2 Restrictions Weight Bearing Restrictions Per Provider Order: No     Mobility  Bed Mobility Overal bed mobility: Modified Independent                  Transfers Overall transfer level: Needs assistance Equipment used: Rolling walker (2 wheels) Transfers: Sit to/from Stand Sit to Stand: Supervision           General transfer comment: increased time, no physical assistance required    Ambulation/Gait Ambulation/Gait assistance: Supervision Gait Distance (Feet): 110 Feet Assistive device: Rolling walker (2 wheels) Gait Pattern/deviations: Step-through pattern Gait velocity: decreased     General Gait Details: slow cadence with no loss of balance using rolling  walker for support. Sp02 88% or higher on 3 L02. cues for breathing techniques with several small standing rest breaks taken. limited baseline endurance and on 3 L02 at baseline   Stairs             Wheelchair Mobility     Tilt Bed    Modified Rankin (Stroke Patients Only)       Balance Overall balance assessment: Needs assistance Sitting-balance support: Feet supported Sitting balance-Leahy Scale: Good     Standing balance support: Bilateral upper extremity supported Standing balance-Leahy Scale: Good                              Communication Communication Communication: No apparent difficulties Factors Affecting Communication: Hearing impaired  Cognition Arousal: Alert Behavior During Therapy: WFL for tasks assessed/performed   PT - Cognitive impairments: No apparent impairments                         Following commands: Intact      Cueing Cueing Techniques: Verbal cues  Exercises      General Comments        Pertinent Vitals/Pain Pain Assessment Pain Assessment: Faces Faces Pain Scale: Hurts a little bit Pain Location: L groin with hip flexion Pain Descriptors / Indicators: Discomfort Pain Intervention(s): Limited activity within patient's tolerance, Monitored during session, Repositioned    Home Living                          Prior Function  PT Goals (current goals can now be found in the care plan section) Acute Rehab PT Goals Patient Stated Goal: return home PT Goal Formulation: With patient Time For Goal Achievement: 02/05/24 Potential to Achieve Goals: Good Progress towards PT goals: Progressing toward goals    Frequency    Min 2X/week      PT Plan      Co-evaluation              AM-PAC PT 6 Clicks Mobility   Outcome Measure  Help needed turning from your back to your side while in a flat bed without using bedrails?: None Help needed moving from lying on your back to  sitting on the side of a flat bed without using bedrails?: None Help needed moving to and from a bed to a chair (including a wheelchair)?: None Help needed standing up from a chair using your arms (e.g., wheelchair or bedside chair)?: None Help needed to walk in hospital room?: None Help needed climbing 3-5 steps with a railing? : A Little 6 Click Score: 23    End of Session Equipment Utilized During Treatment: Oxygen  Activity Tolerance: Patient tolerated treatment well Patient left: in bed;with call bell/phone within reach;with nursing/sitter in room   PT Visit Diagnosis: Pain;Muscle weakness (generalized) (M62.81);Difficulty in walking, not elsewhere classified (R26.2) Pain - Right/Left: Left Pain - part of body: Leg     Time: 1610-9604 PT Time Calculation (min) (ACUTE ONLY): 25 min  Charges:    $Therapeutic Activity: 23-37 mins PT General Charges $$ ACUTE PT VISIT: 1 Visit                     Ozie Bo, PT, MPT    Erlene Hawks 01/28/2024, 11:01 AM

## 2024-01-28 NOTE — Plan of Care (Signed)
  Problem: Education: Goal: Knowledge of General Education information will improve Description: Including pain rating scale, medication(s)/side effects and non-pharmacologic comfort measures Outcome: Progressing   Problem: Clinical Measurements: Goal: Ability to maintain clinical measurements within normal limits will improve Outcome: Progressing   Problem: Clinical Measurements: Goal: Will remain free from infection Outcome: Progressing   Problem: Clinical Measurements: Goal: Diagnostic test results will improve Outcome: Progressing   Problem: Clinical Measurements: Goal: Respiratory complications will improve Outcome: Progressing   Problem: Clinical Measurements: Goal: Cardiovascular complication will be avoided Outcome: Progressing   Problem: Coping: Goal: Level of anxiety will decrease Outcome: Progressing   Problem: Elimination: Goal: Will not experience complications related to bowel motility Outcome: Progressing   Problem: Elimination: Goal: Will not experience complications related to urinary retention Outcome: Progressing   Problem: Pain Managment: Goal: General experience of comfort will improve and/or be controlled Outcome: Progressing   Problem: Skin Integrity: Goal: Risk for impaired skin integrity will decrease Outcome: Progressing   Problem: Fluid Volume: Goal: Ability to maintain a balanced intake and output will improve Outcome: Progressing   Problem: Skin Integrity: Goal: Risk for impaired skin integrity will decrease Outcome: Progressing   Problem: Tissue Perfusion: Goal: Adequacy of tissue perfusion will improve Outcome: Progressing  Plan of care, monitoring, assessment, intervention (s) treatment,  ongoing, see MAR see flowsheet

## 2024-01-28 NOTE — Assessment & Plan Note (Addendum)
 CTA chest not showing evidence of fluid overload, BNP normal Clinically euvolemic Continue home GDMT

## 2024-01-28 NOTE — Assessment & Plan Note (Signed)
 No acute issues suspected

## 2024-01-28 NOTE — Plan of Care (Signed)
 Patient to CT scan via bed, awake alert, no distress, with Nurse tele monitoring and transport, tolerated procedure, back to room with no distress

## 2024-01-28 NOTE — Progress Notes (Signed)
 [facility]  - [place] [facility]   PATIENT NAME: Angela Jensen    MR#:  [id number]  DATE OF BIRTH:  09/02/1955  SUBJECTIVE:  family at bedside. Patient is out in the recliner was eating lunch earlier. Denies any respiratory issues. Feels overall back to baseline.    VITALS:  Blood pressure (!) 147/97, pulse 96, temperature 97.6 F (36.4 C), temperature source Oral, resp. rate 20, height 4' 11 (1.499 m), weight 59.9 kg, SpO2 96%.  PHYSICAL EXAMINATION:   GENERAL:  68 y.o.-year-old patient with no acute distress.  LUNGS: distant breath sounds bilaterally, no wheezing CARDIOVASCULAR: S1, S2 normal. No murmur   ABDOMEN: Soft, nontender, nondistended. Bowel sounds present.  EXTREMITIES: No  edema b/l.    NEUROLOGIC: nonfocal  patient is alert and awake   LABORATORY PANEL:  CBC Recent Labs  Lab 01/27/24 2205  WBC 11.8*  HGB 10.1*  HCT 31.6*  PLT 256    Chemistries  Recent Labs  Lab 01/25/24 0313 01/27/24 2205  NA 135  --   K 4.2  --   CL 97*  --   CO2 30  --   GLUCOSE 146*  --   BUN 13  --   CREATININE 0.67  --   CALCIUM  9.7  --   MG  --  1.8   Cardiac Enzymes No results for input(s): TROPONINI in the last 168 hours. RADIOLOGY:  CT Angio Chest Pulmonary Embolism (PE) W or WO Contrast Result Date: 01/28/2024 CLINICAL DATA:  Wheezing EXAM: CT ANGIOGRAPHY CHEST WITH CONTRAST TECHNIQUE: Multidetector CT imaging of the chest was performed using the standard protocol during bolus administration of intravenous contrast. Multiplanar CT image reconstructions and MIPs were obtained to evaluate the vascular anatomy. RADIATION DOSE REDUCTION: This exam was performed according to the departmental dose-optimization program which includes automated exposure control, adjustment of the mA and/or kV according to patient size and/or use of iterative reconstruction technique. CONTRAST:  OMNIPAQUE  IOHEXOL  350 MG/ML SOLN COMPARISON:  Chest x-ray from the  previous day.  CT from 09/29/2023 FINDINGS: Cardiovascular: Thoracic aorta and its branches demonstrate atherosclerotic calcification without aneurysmal dilatation. Coronary calcifications are noted. No cardiac enlargement is seen. The pulmonary artery shows a normal branching pattern bilaterally. No intraluminal filling defect to suggest pulmonary embolism is seen. Mediastinum/Nodes: Thoracic inlet is within normal limits. The esophagus as visualized is unremarkable. No hilar or mediastinal adenopathy is noted. Lungs/Pleura: Lungs are well aerated bilaterally. Diffuse emphysematous changes are seen. Apical pleural and parenchymal scarring noted in the right apex. Diffuse infiltrate is seen within the right middle and lower lobes. These changes are similar to that seen on recent chest x-ray as well as prior CT and felt to be again related to prior radiation therapy. Stable left upper lobe nodule is noted on image number 38 of series 5 measuring 5 mm. Tiny right upper lobe nodule is noted stable in appearance. Upper Abdomen: Visualized upper abdomen is within normal limits. Musculoskeletal: Postsurgical changes are noted in the right chest wall stable from the prior exam. No acute bony abnormality is noted. Review of the MIP images confirms the above findings. IMPRESSION: No evidence of pulmonary emboli. Right lower lobe infiltrate of a chronic nature. Related to prior radiation Stable bilateral pulmonary nodules dating back to August of 2024. Follow-up CT in 6 months is recommended to assess for stability. Aortic Atherosclerosis (ICD10-I70.0) and Emphysema (ICD10-J43.9). Electronically Signed   By: Violeta Grey M.D.   On: 01/28/2024 03:18  DG Chest 1 View Result Date: 01/27/2024 CLINICAL DATA:  Wheezing. EXAM: CHEST  1 VIEW COMPARISON:  Same day chest radiograph dated 01/27/2024 at 12:24 p.m. FINDINGS: Stable cardiomediastinal silhouette. Similar right mid lung density, most compatible with radiation fibrosis as  noted on the prior CT. Slightly increased right basilar opacity could reflect atelectasis, however, a small layering pleural effusion can not be excluded. Left lung is clear. No pneumothorax. Visualized osseous structures are unchanged. IMPRESSION: 1. Slightly increased right basilar opacity could reflect atelectasis, however, a small layering pleural effusion can not be excluded. 2. Similar right mid lung density, most compatible with radiation fibrosis as noted on the prior CT. Electronically Signed   By: Mannie Seek M.D.   On: 01/27/2024 20:13   DG Chest 2 View Result Date: 01/27/2024 CLINICAL DATA:  Bloody sputum. EXAM: CHEST - 2 VIEW COMPARISON:  September 29, 2023.  June 15, 2023. FINDINGS: The heart size and mediastinal contours are within normal limits. Left lung is clear. Stable right apical scarring is noted. Stable right midlung and basilar density is noted most consistent with radiation fibrosis as noted on prior CT scan. No acute osseous abnormality is noted. IMPRESSION: Stable chronic findings noted in right lung. No acute abnormality seen. Electronically Signed   By: Rosalene Colon M.D.   On: 01/27/2024 13:40    Assessment and Plan Angela Jensen is a 68 y.o. female with past medical history of   SSS s/p micra (06/2021), Lung cancer s/p XRT, hx of recurrent pneumothorax, hypertension,  chronic HFpEF, HTN,  IDDM,, COPD/asthma (baseline 2L O2), OSA,, tobacco use,  PVD s/p left femoral endarterectomy 01/21/24 (prior preop cardiac clearance on 6/5) now receiving  PT/OT without event, being seen in consultation for shortness of breath, resulting in a rapid response on the evening of 01/27/2024.  Acute on chronic respiratory failure with hypoxia and hypercapnia (HCC) -? Disconnected oxygen  tubing -Possible COPD exacerbation, superimposed on OSA --CTA chest negative for PE, infiltrate or fluid overload-stable from prior CT and chest x-ray --Patient wears 3 L at baseline and CPAP at  night --BNP normal and no evidence of interstitial edema to suggest CHF.  No infiltrate to suggest pneumonia --Continue BiPAP and wean as tolerated --DuoNeb as needed --Hold off on antibiotics --tolerating po diet, breathing back to baseline  Elevated troponin --Troponin 50-->133-->261, EKG nonacute, patient without chest pain --Recently had cardiac clearance preoperatively on 6/2 --Suspecting demand ischemia from acute respiratory distress --Echocardiogram to evaluate for segmental wall motion abnormality --[facility] consulted   S/p femoral endarterectomy 01/21/2024 S/p left femoral endarterectomy, left iliac stent, and left SFA, popliteal, and posterior tibial interventions  Atherosclerosis on rest pain/ischemic limb --Management per vascular   SSS (sick sinus syndrome) s/p leadless pacemaker (HCC) --No acute issues suspected   Chronic heart failure with preserved ejection fraction (HFpEF) (HCC) --CTA chest not showing evidence of fluid overload, BNP normal --Clinically euvolemic --Continue home GDMT   Cancer of lower lobe of right lung (HCC) --Patient is s/p XRT --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  >50% time spent on counselling and coordination of care  Note: This dictation was prepared with Dragon dictation along with smaller phrase technology. Any transcriptional errors that result from this process are unintentional.  Melvinia Stager M.D    [facility]   CC: Primary care physician; Laurence Pons, NP

## 2024-01-28 NOTE — Progress Notes (Signed)
 [facility] CLINIC CARDIOLOGY PROGRESS NOTE       [name] MRN: [id number] DOB/AGE: Dec 15, 1955 68 y.o.  Admit date: 01/17/2024 Referring Physician Dr. Mikki Alexander Primary Physician Laurence Pons, NP Primary Cardiologist Dr. Beau Bound Reason for Consultation POC  HPI: Angela Jensen is a 68 y.o. female  with a past medical history of chronic HFpEF, SSS s/p micra (06/2021), hypertension, hyperlipidemia, aortic atherosclerosis, IDDM, PVD, COPD/asthma (baseline 2L O2), OSA, tobacco use who presented for outpatient procedure on 01/17/2024 underwent lower extremity angiogram on 06/02 and found that patient will need a left femoral endarterectomy with illiac stents and SFA intervention. Cardiology was consulted for preoperative cardiac evaluation (06/02), consulted for elevated troponins (06/13).  Interval History: -POD 7 s/p left femoral endarterectomy, iliac stent, SFA and tibial intervention  -Patient reports last night she got out of bed to go to bedside commode and her O2 accidentally disconnected. Patient states when she had no O2 she couldn't breath and felt very distressed. Patient states she requires 3L O2 continuously normally. Patient states she had no chest pain/pressure doing this event. Patient required BiPAP after this respiratory decompensation. Patient weaned back to 3L this AM. -Patient walked with walker to bathroom with minimal assistance on 3L O2 and states her SOB feels a lot better and continues to deny chest pain/pressure.  -Patients BP stable and HR elevated.   -Patient on 3L with stable SpO2. Patient on continues 3L [place] at home.  Review of systems complete and found to be negative unless listed above    Past Medical History:  Diagnosis Date   Anemia    Arthritis    Asthma    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/2016   Cancer (HCC) 2012   Right Lung CA   COPD (chronic obstructive pulmonary disease) (HCC)     Depression    Diabetes mellitus without complication (HCC)    Patient takes Janumet   Essential hypertension 05/26/2016   Heart failure (HCC) 2022   Hydropneumothorax 05/03/2020   Hypercholesteremia    Oxygen  dependent    2L at nite    PAD (peripheral artery disease) (HCC) 06/22/2016   Peripheral vascular disease (HCC)    Personal history of radiation therapy    Shortness of breath dyspnea    with exertion    Sialolithiasis    Sleep apnea    Wears dentures    full upper and lower    Past Surgical History:  Procedure Laterality Date   APPLICATION OF CELL SAVER Left 01/21/2024   Procedure: APPLICATION OF CELL SAVER;  Surgeon: Celso College, MD;  Location: [facility];  Service: Vascular;  Laterality: Left;   CESAREAN SECTION     x3   COLONOSCOPY WITH PROPOFOL  N/A 06/25/2015   Procedure: COLONOSCOPY WITH PROPOFOL ;  Surgeon: Marnee Sink, MD;  Location: [facility];  Service: Endoscopy;  Laterality: N/A;   COLONOSCOPY WITH PROPOFOL  N/A 07/26/2020   Procedure: COLONOSCOPY WITH PROPOFOL ;  Surgeon: Marnee Sink, MD;  Location: [facility];  Service: Endoscopy;  Laterality: N/A;   CYST REMOVAL LEG     and on shoulder    ENDARTERECTOMY FEMORAL Left 01/21/2024   Procedure: LEFT COMMON, SUPERFICIAL FEMORAL AND PROFUNDA FEMORIS ENDARTERECTOMY WITH BOVINE PERICARDIAL PATCH ANGIOPLASTY;  Surgeon: Celso College, MD;  Location: [facility];  Service: Vascular;  Laterality: Left;   ESOPHAGOGASTRODUODENOSCOPY (EGD) WITH PROPOFOL  N/A 07/26/2020   Procedure: ESOPHAGOGASTRODUODENOSCOPY (EGD) WITH PROPOFOL ;  Surgeon: Marnee Sink, MD;  Location:  [facility];  Service: Endoscopy;  Laterality: N/A;  Diabetic - oral meds   INSERTION OF ILIAC STENT Left 01/21/2024   Procedure: INSERTION, STENT, ARTERY, ILIAC;  Surgeon: Celso College, MD;  Location: [facility];  Service: Vascular;  Laterality: Left;  AND SFA STENTS   LOWER EXTREMITY ANGIOGRAPHY Left 09/29/2018   Procedure: LOWER EXTREMITY ANGIOGRAPHY;   Surgeon: Celso College, MD;  Location: [facility];  Service: Cardiovascular;  Laterality: Left;   LOWER EXTREMITY ANGIOGRAPHY Left 07/29/2023   Procedure: Lower Extremity Angiography;  Surgeon: Celso College, MD;  Location: [facility];  Service: Cardiovascular;  Laterality: Left;   LOWER EXTREMITY ANGIOGRAPHY Right 12/20/2023   Procedure: Lower Extremity Angiography;  Surgeon: Celso College, MD;  Location: [facility];  Service: Cardiovascular;  Laterality: Right;   LOWER EXTREMITY ANGIOGRAPHY Left 01/17/2024   Procedure: Lower Extremity Angiography;  Surgeon: Celso College, MD;  Location: [facility];  Service: Cardiovascular;  Laterality: Left;   LUNG BIOPSY  12/30/2011   has lung spots   PACEMAKER IMPLANT  07/14/2021   PACEMAKER LEADLESS INSERTION N/A 07/14/2021   Procedure: PACEMAKER LEADLESS INSERTION;  Surgeon: Percival Brace, MD;  Location: [facility];  Service: Cardiovascular;  Laterality: N/A;   PERIPHERAL VASCULAR CATHETERIZATION Left 06/01/2016   Procedure: Lower Extremity Angiography;  Surgeon: Celso College, MD;  Location: [facility];  Service: Cardiovascular;  Laterality: Left;   PERIPHERAL VASCULAR CATHETERIZATION N/A 06/01/2016   Procedure: Abdominal Aortogram w/Lower Extremity;  Surgeon: Celso College, MD;  Location: [facility];  Service: Cardiovascular;  Laterality: N/A;   PERIPHERAL VASCULAR CATHETERIZATION  06/01/2016   Procedure: Lower Extremity Intervention;  Surgeon: Celso College, MD;  Location: [facility];  Service: Cardiovascular;;   PERIPHERAL VASCULAR CATHETERIZATION Right 06/08/2016   Procedure: Lower Extremity Angiography;  Surgeon: Celso College, MD;  Location: [facility];  Service: Cardiovascular;  Laterality: Right;   PERIPHERAL VASCULAR CATHETERIZATION  06/08/2016   Procedure: Lower Extremity Intervention;  Surgeon: Celso College, MD;  Location: [facility];  Service:  Cardiovascular;;   SUBMANDIBULAR GLAND EXCISION Left 12/06/2020   Procedure: EXCISION SUBMANDIBULAR GLAND;  Surgeon: Lesly Raspberry, MD;  Location: [facility];  Service: ENT;  Laterality: Left;  needs to be first case Diabetic - diet controlled   TEMPORARY PACEMAKER N/A 07/11/2021   Procedure: TEMPORARY PACEMAKER;  Surgeon: Percival Brace, MD;  Location: [facility];  Service: Cardiovascular;  Laterality: N/A;    Medications Prior to Admission  Medication Sig Dispense Refill Last Dose/Taking   albuterol  (VENTOLIN  HFA) 108 (90 Base) MCG/ACT inhaler INHALE 2 PUFFS BY MOUTH EVERY 6 HOURS AS NEEDED FOR WHEEZING OR SHORTNESS OF BREATH 3 each 5 01/17/2024 Morning   ALPRAZolam  (XANAX ) 0.5 MG tablet Take 1 tablet (0.5 mg total) by mouth 2 (two) times daily as needed for anxiety. 60 tablet 2 01/17/2024 Morning   aspirin  EC 81 MG tablet Take 1 tablet (81 mg total) by mouth daily. Swallow whole. 150 tablet 2 01/17/2024 Morning   atorvastatin  (LIPITOR) 10 MG tablet Take 1 tablet (10 mg total) by mouth daily. 30 tablet 11 01/17/2024 Morning   calcium  carbonate (OSCAL) 1500 (600 Ca) MG TABS tablet Take 600 mg of elemental calcium  by mouth 2 (two) times daily with a meal.   01/17/2024 Morning   clopidogrel  (PLAVIX ) 75 MG tablet Take 1 tablet (75 mg total) by  mouth daily. 90 tablet 3 01/17/2024 Morning   cyclobenzaprine  (FLEXERIL ) 10 MG tablet Take 1 tablet (10 mg total) by mouth at bedtime. Take one tab po qhs for back spasm prn only 30 tablet 3 Past Month   escitalopram  (LEXAPRO ) 10 MG tablet Take 1 tablet (10 mg total) by mouth daily. 90 tablet 1 01/17/2024 Morning   ferrous sulfate  325 (65 FE) MG tablet Take 325 mg by mouth 2 (two) times daily with a meal.   01/17/2024 Morning   furosemide  (LASIX ) 20 MG tablet Take 1 tablet (20 mg total) by mouth daily as needed. 30 tablet 5 01/16/2024   gabapentin  (NEURONTIN ) 300 MG capsule TAKE 1 CAPSULE BY MOUTH THREE TIMES DAILY 90 capsule 0 01/17/2024 Morning    insulin  glargine, 1 Unit Dial , (TOUJEO ) 300 UNIT/ML Solostar Pen Inject 10 Units into the skin daily. 1.5 mL 11 01/17/2024 Morning   ipratropium-albuterol  (DUONEB) 0.5-2.5 (3) MG/3ML SOLN USE 1 AMPULE IN NEBULIZER EVERY 4 HOURS AS NEEDED 360 mL 0 01/17/2024 Morning   lisinopril -hydrochlorothiazide  (ZESTORETIC ) 20-25 MG tablet Take 1 tablet by mouth daily. 90 tablet 3 01/17/2024 Morning   metoprolol  succinate (TOPROL -XL) 25 MG 24 hr tablet Take 1 tablet (25 mg total) by mouth daily. 90 tablet 3 01/17/2024 Morning   mometasone -formoterol  (DULERA ) 200-5 MCG/ACT AERO Inhale 2 puffs by mouth twice daily 13 g 12 01/17/2024 Morning   potassium chloride  (KLOR-CON ) 10 MEQ tablet TAKE 1 TABLET BY MOUTH EVERY OTHER DAY 45 tablet 0 01/16/2024   SPIRIVA  HANDIHALER 18 MCG inhalation capsule INHALE THE CONTENTS OF 1 CAPSULES BY MOUTH ONCE DAILY - DO NOT SWALLOW CAPSULES 90 capsule 3 01/17/2024 Morning   vitamin B-12 (CYANOCOBALAMIN ) 100 MCG tablet Take 100 mcg by mouth daily.   01/17/2024 Morning   VITAMIN D , CHOLECALCIFEROL , PO Take 1 tablet by mouth in the morning.   01/17/2024 Morning   ACCU-CHEK GUIDE TEST test strip USE TO TEST BLOOD SUGAR 5 TIMES DAILY 450 each 0    Accu-Chek Softclix Lancets lancets Use 1 lancet 3 times daily to check glucose for diabetes 300 each 1    Blood Glucose Monitoring Suppl (ACCU-CHEK GUIDE) w/Device KIT Use as directed Dx e11.65 1 kit 0    Continuous Blood Gluc Receiver (DEXCOM G7 RECEIVER) DEVI Use one as directed for uncontrolled dm 1 each 0    Continuous Glucose Sensor (DEXCOM G7 SENSOR) MISC USE AS DIRECTED. CHANGE EVERY 10 DAYS. 3 each 0    Ensifentrine  (OHTUVAYRE ) 3 MG/2.5ML SUSP Inhale 3 mg into the lungs in the morning and at bedtime. (Patient not taking: Reported on 01/17/2024) 150 mL 11 Not Taking   famotidine  (PEPCID ) 20 MG tablet Take 1 tablet (20 mg total) by mouth daily for 14 days. (Patient taking differently: Take 20 mg by mouth daily as needed for heartburn.) 14 tablet 0    Insulin   Pen Needle (RELION PEN NEEDLES) 31G X 6 MM MISC USE 1 PEN NEEDLE WITH INSULIN  PEN ONCE DAILY FOR DIABETES 50 each 3    metFORMIN  (GLUCOPHAGE -XR) 500 MG 24 hr tablet TAKE 2 TABLETS BY MOUTH ONCE DAILY WITH BREAKFAST 180 tablet 0    methylPREDNISolone  (MEDROL  DOSEPAK) 4 MG TBPK tablet       OXYGEN  Inhale 4 L into the lungs. PT USES [facility] FOR OXYGEN       Social History   Socioeconomic History   Marital status: Widowed    Spouse name: Not on file   Number of children: Not on file   Years  of education: Not on file   Highest education level: Not on file  Occupational History   Not on file  Tobacco Use   Smoking status: Former    Current packs/day: 0.00    Average packs/day: 1 pack/day for 37.0 years (37.0 ttl pk-yrs)    Types: Cigarettes    Start date: 02/06/1973    Quit date: 02/06/2010    Years since quitting: 13.9   Smokeless tobacco: Former    Types: Snuff  Vaping Use   Vaping status: Never Used  Substance and Sexual Activity   Alcohol use: Not Currently    Alcohol/week: 5.0 standard drinks of alcohol    Types: 5 Cans of beer per week    Comment: /h x of alcohol abuse -stopped 2012- now drinks 5 beer per week   Drug use: Not Currently    Types: Marijuana, Crack cocaine, Cocaine    Comment: hx of cocaine use- last use 2015; last use marijuana6/22/19,   Sexual activity: Yes  Other Topics Concern   Not on file  Social History Narrative   Lives with Significant Other x 43 years   Social Drivers of Corporate investment banker Strain: Not on file  Food Insecurity: No Food Insecurity (01/17/2024)   Hunger Vital Sign    Worried About Running Out of Food in the Last Year: Never true    Ran Out of Food in the Last Year: Never true  Transportation Needs: No Transportation Needs (01/17/2024)   PRAPARE - Administrator, Civil Service (Medical): No    Lack of Transportation (Non-Medical): No  Physical Activity: Not on file  Stress: Not on file  Social Connections:  Socially Integrated (01/17/2024)   Social Connection and Isolation Panel    Frequency of Communication with Friends and Family: More than three times a week    Frequency of Social Gatherings with Friends and Family: More than three times a week    Attends Religious Services: 1 to 4 times per year    Active Member of Clubs or Organizations: No    Attends Banker Meetings: 1 to 4 times per year    Marital Status: Married  Catering manager Violence: Not At Risk (01/17/2024)   Humiliation, Afraid, Rape, and Kick questionnaire    Fear of Current or Ex-Partner: No    Emotionally Abused: No    Physically Abused: No    Sexually Abused: No    Family History  Problem Relation Age of Onset   Diabetes Mother    Hypercholesterolemia Mother    Lung cancer Father    Diabetes Sister    Diabetes Sister    Hypertension Sister    Diabetes Maternal Grandmother    Diabetes Paternal Grandmother    Heart attack Brother    Coronary artery disease Brother    Vascular Disease Brother    Heart attack Brother    Breast cancer Neg Hx      Vitals:   01/28/24 0400 01/28/24 0508 01/28/24 0742 01/28/24 0750  BP:    120/88  Pulse: 85 88  88  Resp: 17 18  18   Temp:    98.4 F (36.9 C)  TempSrc: Axillary   Axillary  SpO2:  96% 98% 100%  Weight:      Height:        PHYSICAL EXAM General: chronically ill appearing elderly female, well nourished, in no acute distress. HEENT: Normocephalic and atraumatic. Neck: No JVD.   Lungs: Normal respiratory effort  on 3L. Diminished breath sounds bilaterally. Heart: HRRR. Normal S1 and S2 without gallops or murmurs.  Abdomen: Non-distended appearing.  Msk: Normal strength and tone for age. Extremities: Warm and well perfused. No clubbing, cyanosis. No edema.  Neuro: Alert and oriented X 3. Psych: Answers questions appropriately.   Labs: Basic Metabolic Panel: Recent Labs    01/27/24 2205  MG 1.8   Liver Function Tests: No results for input(s):  AST, ALT, ALKPHOS, BILITOT, PROT, ALBUMIN in the last 72 hours.  No results for input(s): LIPASE, AMYLASE in the last 72 hours. CBC: Recent Labs    01/27/24 2205  WBC 11.8*  NEUTROABS 10.1*  HGB 10.1*  HCT 31.6*  MCV 91.3  PLT 256   Cardiac Enzymes: Recent Labs    01/27/24 2005 01/27/24 2205 01/28/24 0128  TROPONINIHS 50* 133* 261*   BNP: Recent Labs    01/27/24 2005  BNP 47.8   D-Dimer: Recent Labs    01/27/24 2005  DDIMER 2.36*   Hemoglobin A1C: No results for input(s): HGBA1C in the last 72 hours. Fasting Lipid Panel: No results for input(s): CHOL, HDL, LDLCALC, TRIG, CHOLHDL, LDLDIRECT in the last 72 hours. Thyroid  Function Tests: No results for input(s): TSH, T4TOTAL, T3FREE, THYROIDAB in the last 72 hours.  Invalid input(s): FREET3 Anemia Panel: No results for input(s): VITAMINB12, FOLATE, FERRITIN, TIBC, IRON , RETICCTPCT in the last 72 hours.   Radiology: CT Angio Chest Pulmonary Embolism (PE) W or WO Contrast Result Date: 01/28/2024 CLINICAL DATA:  Wheezing EXAM: CT ANGIOGRAPHY CHEST WITH CONTRAST TECHNIQUE: Multidetector CT imaging of the chest was performed using the standard protocol during bolus administration of intravenous contrast. Multiplanar CT image reconstructions and MIPs were obtained to evaluate the vascular anatomy. RADIATION DOSE REDUCTION: This exam was performed according to the departmental dose-optimization program which includes automated exposure control, adjustment of the mA and/or kV according to patient size and/or use of iterative reconstruction technique. CONTRAST:  OMNIPAQUE  IOHEXOL  350 MG/ML SOLN COMPARISON:  Chest x-ray from the previous day.  CT from 09/29/2023 FINDINGS: Cardiovascular: Thoracic aorta and its branches demonstrate atherosclerotic calcification without aneurysmal dilatation. Coronary calcifications are noted. No cardiac enlargement is seen. The pulmonary artery  shows a normal branching pattern bilaterally. No intraluminal filling defect to suggest pulmonary embolism is seen. Mediastinum/Nodes: Thoracic inlet is within normal limits. The esophagus as visualized is unremarkable. No hilar or mediastinal adenopathy is noted. Lungs/Pleura: Lungs are well aerated bilaterally. Diffuse emphysematous changes are seen. Apical pleural and parenchymal scarring noted in the right apex. Diffuse infiltrate is seen within the right middle and lower lobes. These changes are similar to that seen on recent chest x-ray as well as prior CT and felt to be again related to prior radiation therapy. Stable left upper lobe nodule is noted on image number 38 of series 5 measuring 5 mm. Tiny right upper lobe nodule is noted stable in appearance. Upper Abdomen: Visualized upper abdomen is within normal limits. Musculoskeletal: Postsurgical changes are noted in the right chest wall stable from the prior exam. No acute bony abnormality is noted. Review of the MIP images confirms the above findings. IMPRESSION: No evidence of pulmonary emboli. Right lower lobe infiltrate of a chronic nature. Related to prior radiation Stable bilateral pulmonary nodules dating back to August of 2024. Follow-up CT in 6 months is recommended to assess for stability. Aortic Atherosclerosis (ICD10-I70.0) and Emphysema (ICD10-J43.9). Electronically Signed   By: Violeta Grey M.D.   On: 01/28/2024 03:18   DG Chest  1 View Result Date: 01/27/2024 CLINICAL DATA:  Wheezing. EXAM: CHEST  1 VIEW COMPARISON:  Same day chest radiograph dated 01/27/2024 at 12:24 p.m. FINDINGS: Stable cardiomediastinal silhouette. Similar right mid lung density, most compatible with radiation fibrosis as noted on the prior CT. Slightly increased right basilar opacity could reflect atelectasis, however, a small layering pleural effusion can not be excluded. Left lung is clear. No pneumothorax. Visualized osseous structures are unchanged. IMPRESSION: 1.  Slightly increased right basilar opacity could reflect atelectasis, however, a small layering pleural effusion can not be excluded. 2. Similar right mid lung density, most compatible with radiation fibrosis as noted on the prior CT. Electronically Signed   By: Mannie Seek M.D.   On: 01/27/2024 20:13   DG Chest 2 View Result Date: 01/27/2024 CLINICAL DATA:  Bloody sputum. EXAM: CHEST - 2 VIEW COMPARISON:  September 29, 2023.  June 15, 2023. FINDINGS: The heart size and mediastinal contours are within normal limits. Left lung is clear. Stable right apical scarring is noted. Stable right midlung and basilar density is noted most consistent with radiation fibrosis as noted on prior CT scan. No acute osseous abnormality is noted. IMPRESSION: Stable chronic findings noted in right lung. No acute abnormality seen. Electronically Signed   By: Rosalene Colon M.D.   On: 01/27/2024 13:40   DG C-Arm 1-60 Min-No Report Result Date: 01/21/2024 Fluoroscopy was utilized by the requesting physician.  No radiographic interpretation.   DG C-Arm 1-60 Min-No Report Result Date: 01/21/2024 Fluoroscopy was utilized by the requesting physician.  No radiographic interpretation.   DG C-Arm 1-60 Min-No Report Result Date: 01/21/2024 Fluoroscopy was utilized by the requesting physician.  No radiographic interpretation.   PERIPHERAL VASCULAR CATHETERIZATION Result Date: 01/17/2024 See surgical note for result.  VAS US  ABI WITH/WO TBI Result Date: 01/06/2024  LOWER EXTREMITY DOPPLER STUDY Patient Name:  SURY WENTWORTH  Date of Exam:   01/05/2024 Medical Rec #: [id number]     Accession #:    [id number] Date of Birth: 1955-09-17     Patient Gender: F Patient Age:   36 years Exam Location:  [facility] Procedure:      VAS US  ABI WITH/WO TBI Referring Phys: Mikki Alexander --------------------------------------------------------------------------------  Indications: Claudication, and peripheral artery disease. Other Factors:  Left SFA stents are known to be occluded.                Patient complains of constant numbness in left foot.                 07/29/2023: Aortogram and selective Left Lower                ExtremityAngiogram. Mechanical thrombectomy of the Left SFA and                Popliteal Artery with the [place] Rex device. Stent placement to the                Proximal Left SFA with 6 mm diameter by 5 cm length Viabahn                stent. Stent placement to the Left Popliteal Artery above the                knee with 6 mm diameter by 5 cm length Viabahn stent.                 12/20/2023: Aortogram and Selective Right Lower  ExtremityAngiogram. PTA of the Right SFA with 4 mm diameter by 22                cm length Lutonix drug coated angioplasty balloon. Stent                placement to the Right SFA with 6 mm by 20 cm length Life stent.  Vascular Interventions: 06/01/16: Left distal SFA/popliteal arteries PTAs with                         SFA stent x2;                         06/08/16: Left SFA PTA;                         09/29/18: Left SFA stent x2;. Comparison Study: 12/01/2023 Performing Technologist: Tonie Franks RVS  Examination Guidelines: A complete evaluation includes at minimum, Doppler waveform signals and systolic blood pressure reading at the level of bilateral brachial, anterior tibial, and posterior tibial arteries, when vessel segments are accessible. Bilateral testing is considered an integral part of a complete examination. Photoelectric Plethysmograph (PPG) waveforms and toe systolic pressure readings are included as required and additional duplex testing as needed. Limited examinations for reoccurring indications may be performed as noted.  ABI Findings: +---------+------------------+-----+--------+--------+ Right    Rt Pressure (mmHg)IndexWaveformComment  +---------+------------------+-----+--------+--------+ Brachial 115                                      +---------+------------------+-----+--------+--------+ ATA      100               0.87 biphasic         +---------+------------------+-----+--------+--------+ PTA      105               0.91 biphasic         +---------+------------------+-----+--------+--------+ Great Toe108               0.94 Normal           +---------+------------------+-----+--------+--------+ +---------+------------------+-----+-------------------+-------+ Left     Lt Pressure (mmHg)IndexWaveform           Comment +---------+------------------+-----+-------------------+-------+ Brachial 114                                               +---------+------------------+-----+-------------------+-------+ ATA      43                0.37 dampened monophasic        +---------+------------------+-----+-------------------+-------+ PTA                             Non pulsatile flow         +---------+------------------+-----+-------------------+-------+ PERO     46                0.40 dampened monophasic        +---------+------------------+-----+-------------------+-------+ Great Toe32                0.28                            +---------+------------------+-----+-------------------+-------+ +-------+-----------+-----------+------------+------------+  ABI/TBIToday's ABIToday's TBIPrevious ABIPrevious TBI +-------+-----------+-----------+------------+------------+ Right  .91        .94        .73         .85          +-------+-----------+-----------+------------+------------+ Left   .40        .28        .86         .94          +-------+-----------+-----------+------------+------------+ Right ABIs and TBIs appear increased compared to prior study on 12/01/2023. Left ABIs and TBIs appear decreased compared to prior study on 12/01/2023.  Summary: Right: Resting right ankle-brachial index indicates mild right lower extremity arterial disease. The right toe-brachial index is normal.  Left: Resting left ankle-brachial index indicates severe left lower extremity arterial disease. The left toe-brachial index is abnormal. *See table(s) above for measurements and observations.  Electronically signed by Mikki Alexander MD on 01/06/2024 at 8:12:02 AM.    Final     ECHO ordered.  06/15/2023  1. Left ventricular ejection fraction, by estimation, is >55%. The left ventricle has normal function. Left ventricular endocardial border not optimally defined to evaluate regional wall motion. Left ventricular diastolic parameters are consistent with Grade I diastolic dysfunction (impaired relaxation).   2. Right ventricular systolic function is normal. The right ventricular size is normal. Tricuspid regurgitation signal is inadequate for assessing PA pressure.   3. The mitral valve was not well visualized. Trivial mitral valve regurgitation.   4. Tricuspid valve regurgitation is mild to moderate.  5. The aortic valve was not well visualized. Aortic valve regurgitation is not visualized. No aortic stenosis is present.   6. The inferior vena cava is normal in size with greater than 50% respiratory variability, suggesting right atrial pressure of 3 mmHg.   TELEMETRY reviewed by me 01/28/2024: sinus tachycardia, rate 100s   EKG reviewed by me: sinus rhythm, RBBB rate 91 bpm.  EKG 06/12: NSR with RBBB rate 99 bpm (unchanged from prior EKG)  Data reviewed by me 01/28/2024: last 24h vitals tele labs imaging I/O vascular surgery notes, Op note, hospitalist notes  Principal Problem:   Ischemic leg Active Problems:   Personal history of tobacco use, presenting hazards to health   Cancer of lower lobe of right lung (HCC)   Elevated troponin   Acute on chronic respiratory failure with hypoxia and hypercapnia (HCC)   Atherosclerosis of artery of extremity with rest pain (HCC)   S/p femoral endarterectomy 01/21/2024   Chronic heart failure with preserved ejection fraction (HFpEF) (HCC)   SSS (sick sinus  syndrome) s/p leadless pacemaker [facility])    ASSESSMENT AND PLAN:  Angela Jensen is a 68 y.o. female  with a past medical history of chronic HFpEF, SSS s/p micra (06/2021), hypertension, hyperlipidemia, aortic atherosclerosis, IDDM, PVD, COPD/asthma (baseline 2L O2), OSA, tobacco use who presented for outpatient procedure on 01/17/2024 underwent lower extremity angiogram on 06/02 and found that patient will need a left femoral endarterectomy with illiac stents and SFA intervention. Cardiology was consulted for preoperative cardiac evaluation (06/02), consulted for elevated troponins (06/13).  # Acute on chronic respiratory failure with hypoxia and hypercapnia # COPD exacerbation Patient reports last night she got out of bed to go to bedside commode and her O2 accidentally disconnected. Patient states when she had no O2 she couldn't breath and felt very distressed. Patient states she requires 3L O2 continuously. Patient states she had no chest pain/pressure doing this event. Patient required  BiPAP after this respiratory decompensation. Patient now on 3L this AM and reports breathing is much improved. -Recommend continuing breathing treatments for COPD.  -Pulmonology consulted, appreciate recommendations.  # Demand ischemia  # Hypertension # Hyperlipidemia # Chronic HFpEF # SSS s/p micra (06/2021) Patient with PAD with rest pain LLE underwent left lower extremity angiogram that demonstrated 70% stenosis of left common femoral artery on 06/02. Patient requiring left femoral endarterectomy with illiac stents and SFA intervention. Echo from 05/2023 with pEF and low risk. EKG from (06/03) with sinus rhythm, RBBB is unchanged from prior EKG, no acute ischemic changes. During respiratory distress event tropnins were drawn and found to be elevated. Trops elevated and flat 50 > 130 > 260 > 230. BNP within normal limits. EKG on 06/12 with NSR with RBBB rate 99 bpm without acute ischemic changes. (unchanged from  prior EKG). BP stable -Echo ordered. Further recommendations pending echo. -Continue ASA 81 mg, atorvastatin  10 mg daily. -Increase metoprolol  succinate to 50 mg daily. -Ordered losartan 25 mg daily. -Plan to optimize GDMT if BP and renal function allows.  -Elevated and flat trops in setting of acute on chronic respiratory failure and likely COPD exacerbation is most consistent with demand/mismatch ischemia and not ACS.  -Due to patients multiple comorbidties and risk factors for coronary artery disease would recommend outpatient cardiac stress testing.  #Preop Cardiac Evaluation # PAD with Left common femoral artery stenosis s/p left femoral endarterectomy POD#7 s/p left femoral endarterectomy, iliac stent, SFA and tibial intervention.  -Management per vascular surgery.    This patient's plan of care was discussed and created with Dr. Custovic and she is in agreement.  Signed: Creighton Doffing, PA-C  01/28/2024, 9:38 AM [facility]

## 2024-01-28 NOTE — Progress Notes (Signed)
 [facility]  Daily Progress Note   Subjective  -   Patient had a respiratory decompensation overnight requiring BiPAP for several hours.  An extra DuoNeb treatment and some Lasix  was given.  She has baseline COPD and wears oxygen  at home.  She seems to be near her baseline breathing at this point.  Internal medicine consultation was obtained and I appreciate their input.  Objective Vitals:   01/28/24 0330 01/28/24 0400 01/28/24 0508 01/28/24 0742  BP:      Pulse: 89 85 88   Resp: 17 17 18    Temp:      TempSrc:  Axillary    SpO2:   96% 98%  Weight:      Height:        Intake/Output Summary (Last 24 hours) at 01/28/2024 0750 Last data filed at 01/28/2024 0400 Gross per 24 hour  Intake 909 ml  Output --  Net 909 ml    PULM  somewhat diminished with minimal wheezing CV  RRR VASC  left foot is warm with good capillary refill.  1+ left posterior tibial pulse present.  Laboratory CBC    Component Value Date/Time   WBC 11.8 (H) 01/27/2024 2205   HGB 10.1 (L) 01/27/2024 2205   HGB 12.7 10/08/2023 1004   HGB 11.6 10/04/2023 1047   HCT 31.6 (L) 01/27/2024 2205   HCT 37.6 10/04/2023 1047   PLT 256 01/27/2024 2205   PLT 264 10/08/2023 1004   PLT 236 10/04/2023 1047    BMET    Component Value Date/Time   NA 135 01/25/2024 0313   NA 144 10/04/2023 1047   NA 141 04/17/2014 1055   K 4.2 01/25/2024 0313   K 3.9 04/17/2014 1055   CL 97 (L) 01/25/2024 0313   CL 102 04/17/2014 1055   CO2 30 01/25/2024 0313   CO2 30 04/17/2014 1055   GLUCOSE 146 (H) 01/25/2024 0313   GLUCOSE 201 (H) 04/17/2014 1055   BUN 13 01/25/2024 0313   BUN 12 10/04/2023 1047   BUN 10 04/17/2014 1055   CREATININE 0.67 01/25/2024 0313   CREATININE 0.84 07/06/2023 1016   CREATININE 0.90 04/17/2014 1055   CALCIUM  9.7 01/25/2024 0313   CALCIUM  9.1 04/17/2014 1055   GFRNONAA >60 01/25/2024 0313   GFRNONAA >60 07/06/2023 1016   GFRNONAA >60 04/17/2014 1055   GFRAA >60 05/05/2020  0558   GFRAA >60 04/17/2014 1055    Assessment/Planning: POD #7 s/p left femoral endarterectomy, iliac stent, SFA and tibial intervention  Perfusion has been stable.  Left foot is warm with good posterior tibial pulse. Biggest issue over the past several days has been cardiopulmonary dysfunction. Appreciate hospitalist's assistance in consultation last night for increased shortness of breath and hypoxia Seems to be back near her baseline on nasal cannula oxygen  this morning.  Breathing comfortably.  Was able to wean off BiPAP in several hours.  An extra nebulizer treatment was given and some Lasix  was given. At this point, we will appreciate any assistance with cardiopulmonary management from internal medicine Perfusion is stable and I would like to increase her activity as tolerated Increase pulmonary toilet with incentive spirometer, increased activity, and staying out of bed is much as possible   Mikki Alexander  01/28/2024, 7:50 AM

## 2024-01-28 NOTE — Inpatient Diabetes Management (Signed)
 Inpatient Diabetes Program Recommendations  AACE/ADA: New Consensus Statement on Inpatient Glycemic Control   Target Ranges:  Prepandial:   less than 140 mg/dL      Peak postprandial:   less than 180 mg/dL (1-2 hours)      Critically ill patients:  140 - 180 mg/dL    Latest Reference Range & Units 01/27/24 07:40 01/27/24 12:01 01/27/24 15:46 01/27/24 18:31 01/27/24 21:06 01/28/24 07:56  Glucose-Capillary 70 - 99 mg/dL 742 (H) 595 (H) 638 (H) 338 (H) 217 (H) 247 (H)   Review of Glycemic Control  Diabetes history: DM2 Outpatient Diabetes medications: Toujeo  10 units daily, Metformin  XR 1000 mg QAM Current orders for Inpatient glycemic control: Novolog  0-20 units AC&HS; Solumedrol 40 mg Q24H  Inpatient Diabetes Program Recommendations:    Insulin : If steroids are continued as ordered, please consider ordering Semglee  6 units Q24H.  Thanks, Beacher Limerick, RN, MSN, CDCES Diabetes Coordinator Inpatient Diabetes Program [phone] (Team Pager from 8am to 5pm)

## 2024-01-28 NOTE — Progress Notes (Addendum)
 SLP Cancellation Note  Patient Details Name: Angela Jensen MRN: [id number] DOB: Nov 11, 1955   Cancelled treatment:       Reason Eval/Treat Not Completed: SLP screened, no needs identified, will sign off (chart reviewed; consulted NSG then met w/ pt and Daughter in room.)  Pt is a 68 y/o F admitted on 01/17/24 for LLE angiography. Pt is s/p LLE femoral endarterectomy with iliac stent placement on 01/21/24. PMH: asthma, lung CA, COPD, depression, DM, HTN, hypercholesterolemia, O2 dependent at night, PAD, Sialolithiasis, sleep apnea.  Last evening(6/12) per chart notes, pt c/o being unable to breathe and she was noted to be tachypneic, diaphoretic and anxious. Oxygen  tubing was noted to be disconnected, with O2 sat in the 70s and heart rate in the 100-130s. Pt responded well to Lasix  and BiPAP given and back to/close to her Baseline this morning per MD note; cardiopulmonary management has been requested by MD.  Pt and Daughter present(during meal) denied any difficulty swallowing and is currently on a regular diet; tolerates swallowing pills w/ water  per NSG- I have no trouble swallowing those big pills. Pt conversed in conversation w/out expressive/receptive deficits noted; pt denied any speech-language deficits. Speech clear. Pt quite pleasant.  No further skilled ST services indicated as pt appears at her baseline. Pt agreed. MD/NSG to reconsult if any change in status while admitted.       Darla Edward, MS, CCC-SLP Speech Language Pathologist Rehab Services; [facility] [phone] (ascom) Alaiah Lundy 01/28/2024, 9:36 AM

## 2024-01-28 NOTE — Assessment & Plan Note (Addendum)
 Troponin 50-->133-->261, EKG nonacute, patient without chest pain Recently had cardiac clearance preoperatively on 6/2 Suspecting demand ischemia from acute respiratory distress Echocardiogram to evaluate for segmental wall motion abnormality Cardiology consulted

## 2024-01-29 ENCOUNTER — Inpatient Hospital Stay
Admission: RE | Admit: 2024-01-29 | Discharge: 2024-01-29 | Disposition: A | Discharge status: Home / Self Care | Source: Home / Self Care | Attending: Internal Medicine | Admitting: Vascular Surgery

## 2024-01-29 ENCOUNTER — Inpatient Hospital Stay

## 2024-01-29 ENCOUNTER — Encounter: Payer: Self-pay | Admitting: Vascular Surgery

## 2024-01-29 DIAGNOSIS — J9621 Acute and chronic respiratory failure with hypoxia: Secondary | ICD-10-CM | POA: Diagnosis not present

## 2024-01-29 DIAGNOSIS — I5032 Chronic diastolic (congestive) heart failure: Secondary | ICD-10-CM | POA: Diagnosis not present

## 2024-01-29 DIAGNOSIS — I998 Other disorder of circulatory system: Secondary | ICD-10-CM | POA: Diagnosis not present

## 2024-01-29 DIAGNOSIS — R7989 Other specified abnormal findings of blood chemistry: Secondary | ICD-10-CM | POA: Diagnosis not present

## 2024-01-29 LAB — CBC
HCT: 28.7 % — ABNORMAL LOW (ref 36.0–46.0)
Hemoglobin: 9 g/dL — ABNORMAL LOW (ref 12.0–15.0)
MCH: 28.8 pg (ref 26.0–34.0)
MCHC: 31.4 g/dL (ref 30.0–36.0)
MCV: 91.7 fL (ref 80.0–100.0)
Platelets: 300 10*3/uL (ref 150–400)
RBC: 3.13 MIL/uL — ABNORMAL LOW (ref 3.87–5.11)
RDW: 14.2 % (ref 11.5–15.5)
WBC: 12 10*3/uL — ABNORMAL HIGH (ref 4.0–10.5)
nRBC: 0 % (ref 0.0–0.2)

## 2024-01-29 LAB — BASIC METABOLIC PANEL WITH GFR
Anion gap: 7 (ref 5–15)
BUN: 19 mg/dL (ref 8–23)
CO2: 34 mmol/L — ABNORMAL HIGH (ref 22–32)
Calcium: 8.5 mg/dL — ABNORMAL LOW (ref 8.9–10.3)
Chloride: 96 mmol/L — ABNORMAL LOW (ref 98–111)
Creatinine, Ser: 0.68 mg/dL (ref 0.44–1.00)
GFR, Estimated: >60 mL/min (ref >60)
Glucose, Bld: 275 mg/dL — ABNORMAL HIGH (ref 70–99)
Potassium: 3.4 mmol/L — ABNORMAL LOW (ref 3.5–5.1)
Sodium: 137 mmol/L (ref 135–145)

## 2024-01-29 LAB — ECHOCARDIOGRAM COMPLETE
Area-P 1/2: 4.01 cm2
Height: 59 in
S' Lateral: 2.8 cm
Weight: 2112.89 [oz_av]

## 2024-01-29 LAB — GLUCOSE, CAPILLARY
Glucose-Capillary: 261 mg/dL — ABNORMAL HIGH (ref 70–99)
Glucose-Capillary: 205 mg/dL — ABNORMAL HIGH (ref 70–99)
Glucose-Capillary: 209 mg/dL — ABNORMAL HIGH (ref 70–99)
Glucose-Capillary: 165 mg/dL — ABNORMAL HIGH (ref 70–99)

## 2024-01-29 MED ORDER — POTASSIUM CHLORIDE CRYS ER 20 MEQ PO TBCR
40.0000 meq | EXTENDED_RELEASE_TABLET | Freq: Two times a day (BID) | ORAL | Status: AC
  Administered 2024-01-29 (×2): 40 meq via ORAL
  Filled 2024-01-29 (×2): qty 2

## 2024-01-29 NOTE — Progress Notes (Signed)
      Daily Progress Note   Assessment/Planning:   POD #8 s/p L fem EA w/ BPA, PTA+S L EIA, L-fem pop mech TE and PTA+S, Mech TE and PTA L TPT and PTA  Still dyspneic with short distance ambulation Continue cardiopulmonary optimization: suspect pt would bounce back rapidly if discharged Pt complain of L leg pain but warm.  Will order ABI     Subjective  - 8 Days Post-Op   Still having problems with breathing, continuing to cough   Objective   Vitals:   01/28/24 1943 01/28/24 2333 01/29/24 0424 01/29/24 0759  BP: 106/67 111/74 118/77   Pulse: 85 86 90   Resp: 18 18 18    Temp: 98.6 F (37 C) 97.7 F (36.5 C) 98.4 F (36.9 C) 98.4 F (36.9 C)  TempSrc: Oral Oral Oral Oral  SpO2: 97% 98% 97%   Weight:      Height:         Intake/Output Summary (Last 24 hours) at 01/29/2024 0839 Last data filed at 01/28/2024 1300 Gross per 24 hour  Intake 440 ml  Output --  Net 440 ml    PULM  CTAB  CV  RRR  GI  soft, NTND  VASC LEFT groin inc bandaged, some blood on incision, warm L foot  NEURO Intact motor and sensation    Laboratory   CBC    Latest Ref Rng & Units 01/29/2024    5:32 AM 01/27/2024   10:05 PM 01/25/2024    3:13 AM  CBC  WBC 4.0 - 10.5 K/uL 12.0  11.8  8.4   Hemoglobin 12.0 - 15.0 g/dL 9.0  16.1  09.6   Hematocrit 36.0 - 46.0 % 28.7  31.6  31.3   Platelets 150 - 400 K/uL 300  256  170     BMET    Component Value Date/Time   NA 137 01/29/2024 0532   NA 144 10/04/2023 1047   NA 141 04/17/2014 1055   K 3.4 (L) 01/29/2024 0532   K 3.9 04/17/2014 1055   CL 96 (L) 01/29/2024 0532   CL 102 04/17/2014 1055   CO2 34 (H) 01/29/2024 0532   CO2 30 04/17/2014 1055   GLUCOSE 275 (H) 01/29/2024 0532   GLUCOSE 201 (H) 04/17/2014 1055   BUN 19 01/29/2024 0532   BUN 12 10/04/2023 1047   BUN 10 04/17/2014 1055   CREATININE 0.68 01/29/2024 0532   CREATININE 0.84 07/06/2023 1016   CREATININE 0.90 04/17/2014 1055   CALCIUM  8.5 (L) 01/29/2024 0532   CALCIUM   9.1 04/17/2014 1055   GFRNONAA >60 01/29/2024 0532   GFRNONAA >60 07/06/2023 1016   GFRNONAA >60 04/17/2014 1055   GFRAA >60 05/05/2020 0558   GFRAA >60 04/17/2014 1055     Roxy Cordial, MD, FACS, FSVS Covering for  Vascular and Vein Surgery: [phone]  01/29/2024, 8:39 AM

## 2024-01-29 NOTE — Progress Notes (Signed)
 [facility]  - [place] [facility]   PATIENT NAME: Angela Jensen    MR#:  [id number]  DATE OF BIRTH:  [birth year]  SUBJECTIVE:  Denies any respiratory issues. Feels overall back to baseline. Some ongoing leg pain  VITALS:  Blood pressure 118/77, pulse 90, temperature 98.4 F (36.9 C), temperature source Oral, resp. rate 18, height 4' 11 (1.499 m), weight 59.9 kg, SpO2 96%.  PHYSICAL EXAMINATION:   GENERAL:  68 y.o.-year-old patient with no acute distress.  LUNGS: distant breath sounds bilaterally, no wheezing CARDIOVASCULAR: S1, S2 normal. No murmur   ABDOMEN: Soft, nontender, nondistended. Bowel sounds present.  EXTREMITIES: No  edema b/l.    NEUROLOGIC: nonfocal  patient is alert and awake   LABORATORY PANEL:  CBC Recent Labs  Lab 01/29/24 0532  WBC 12.0*  HGB 9.0*  HCT 28.7*  PLT 300    Chemistries  Recent Labs  Lab 01/27/24 2205 01/29/24 0532  NA  --  137  K  --  3.4*  CL  --  96*  CO2  --  34*  GLUCOSE  --  275*  BUN  --  19  CREATININE  --  0.68  CALCIUM   --  8.5*  MG 1.8  --    Cardiac Enzymes No results for input(s): TROPONINI in the last 168 hours. RADIOLOGY:  DG Chest 2 View Result Date: 01/29/2024 CLINICAL DATA:  Short of breath, history of lung cancer status post radiation therapy EXAM: CHEST - 2 VIEW COMPARISON:  01/27/2024 FINDINGS: Frontal and lateral views of the chest demonstrate a stable cardiac silhouette. Areas of consolidation and scarring at the right lung base most consistent with post radiation changes in this patient with a history of lung cancer. No acute airspace disease, effusion, or pneumothorax. No acute bony abnormalities. IMPRESSION: 1. Stable post therapeutic changes at the right lung base. 2. No acute intrathoracic process. Electronically Signed   By: Bobbye Burrow M.D.   On: 01/29/2024 13:42   US  ARTERIAL ABI (SCREENING LOWER EXTREMITY) Result Date: 01/29/2024 CLINICAL DATA:  Peripheral vascular disease  and status post recent left femoral endarterectomy with mechanical thrombectomy of left SFA a, left SFA and popliteal stent placement, tibial peroneal and posterior tibial angioplasty and left external iliac artery stent placement. EXAM: NONINVASIVE PHYSIOLOGIC VASCULAR STUDY OF BILATERAL LOWER EXTREMITIES TECHNIQUE: Evaluation of both lower extremities were performed at rest, including calculation of ankle-brachial indices with single level Doppler, pressure and pulse volume recording. COMPARISON:  Report from a prior study on 12/01/2023 FINDINGS: Right ABI:  0.75.  Previously 0.73 Left ABI:  0.88.  Previously 0.86 Right Lower Extremity:  Monophasic distal waveforms Left Lower Extremity:  Monophasic distal wave 0.5-0.79 Moderate PAD IMPRESSION: Resting left ABI of 0.88 after recent left lower extremity surgery and endovascular intervention. Stable resting right ABI of 0.75. Electronically Signed   By: Erica Hau M.D.   On: 01/29/2024 10:22   CT Angio Chest Pulmonary Embolism (PE) W or WO Contrast Result Date: 01/28/2024 CLINICAL DATA:  Wheezing EXAM: CT ANGIOGRAPHY CHEST WITH CONTRAST TECHNIQUE: Multidetector CT imaging of the chest was performed using the standard protocol during bolus administration of intravenous contrast. Multiplanar CT image reconstructions and MIPs were obtained to evaluate the vascular anatomy. RADIATION DOSE REDUCTION: This exam was performed according to the departmental dose-optimization program which includes automated exposure control, adjustment of the mA and/or kV according to patient size and/or use of iterative reconstruction technique. CONTRAST:  OMNIPAQUE  IOHEXOL  350 MG/ML SOLN  COMPARISON:  Chest x-ray from the previous day.  CT from 09/29/2023 FINDINGS: Cardiovascular: Thoracic aorta and its branches demonstrate atherosclerotic calcification without aneurysmal dilatation. Coronary calcifications are noted. No cardiac enlargement is seen. The pulmonary artery shows a  normal branching pattern bilaterally. No intraluminal filling defect to suggest pulmonary embolism is seen. Mediastinum/Nodes: Thoracic inlet is within normal limits. The esophagus as visualized is unremarkable. No hilar or mediastinal adenopathy is noted. Lungs/Pleura: Lungs are well aerated bilaterally. Diffuse emphysematous changes are seen. Apical pleural and parenchymal scarring noted in the right apex. Diffuse infiltrate is seen within the right middle and lower lobes. These changes are similar to that seen on recent chest x-ray as well as prior CT and felt to be again related to prior radiation therapy. Stable left upper lobe nodule is noted on image number 38 of series 5 measuring 5 mm. Tiny right upper lobe nodule is noted stable in appearance. Upper Abdomen: Visualized upper abdomen is within normal limits. Musculoskeletal: Postsurgical changes are noted in the right chest wall stable from the prior exam. No acute bony abnormality is noted. Review of the MIP images confirms the above findings. IMPRESSION: No evidence of pulmonary emboli. Right lower lobe infiltrate of a chronic nature. Related to prior radiation Stable bilateral pulmonary nodules dating back to August of 2024. Follow-up CT in 6 months is recommended to assess for stability. Aortic Atherosclerosis (ICD10-I70.0) and Emphysema (ICD10-J43.9). Electronically Signed   By: Violeta Grey M.D.   On: 01/28/2024 03:18   DG Chest 1 View Result Date: 01/27/2024 CLINICAL DATA:  Wheezing. EXAM: CHEST  1 VIEW COMPARISON:  Same day chest radiograph dated 01/27/2024 at 12:24 p.m. FINDINGS: Stable cardiomediastinal silhouette. Similar right mid lung density, most compatible with radiation fibrosis as noted on the prior CT. Slightly increased right basilar opacity could reflect atelectasis, however, a small layering pleural effusion can not be excluded. Left lung is clear. No pneumothorax. Visualized osseous structures are unchanged. IMPRESSION: 1. Slightly  increased right basilar opacity could reflect atelectasis, however, a small layering pleural effusion can not be excluded. 2. Similar right mid lung density, most compatible with radiation fibrosis as noted on the prior CT. Electronically Signed   By: Mannie Seek M.D.   On: 01/27/2024 20:13    Assessment and Plan Angela Jensen is a 68 y.o. female with past medical history of   SSS s/p micra (06/2021), Lung cancer s/p XRT, hx of recurrent pneumothorax, hypertension,  chronic HFpEF, HTN,  IDDM,, COPD/asthma (baseline 2L O2), OSA,, tobacco use,  PVD s/p left femoral endarterectomy 01/21/24 (prior preop cardiac clearance on 6/5) now receiving  PT/OT without event, being seen in consultation for shortness of breath, resulting in a rapid response on the evening of 01/27/2024.  Acute on chronic respiratory failure with hypoxia and hypercapnia (HCC) -? Disconnected oxygen  tubing -Possible COPD exacerbation, superimposed on OSA --CTA chest negative for PE, infiltrate or fluid overload-stable from prior CT and chest x-ray --Patient wears 3 L at baseline and CPAP at night --BNP normal and no evidence of interstitial edema to suggest CHF.  No infiltrate to suggest pneumonia --Continue BiPAP and wean as tolerated --DuoNeb as needed --Hold off on antibiotics --tolerating po diet, breathing back to baseline  Elevated troponin --Troponin 50-->133-->261, EKG nonacute, patient without chest pain --Recently had cardiac clearance preoperatively on 6/2 --Suspecting demand ischemia from acute respiratory distress --Echocardiogram to evaluate for segmental wall motion abnormality --[facility] consulted   S/p femoral endarterectomy 01/21/2024 S/p left femoral endarterectomy,  left iliac stent, and left SFA, popliteal, and posterior tibial interventions  Atherosclerosis on rest pain/ischemic limb --Management per vascular   SSS (sick sinus syndrome) s/p leadless pacemaker (HCC) --No acute issues suspected    Chronic heart failure with preserved ejection fraction (HFpEF) (HCC) --CTA chest not showing evidence of fluid overload, BNP normal --Clinically euvolemic --Continue home GDMT --Echo pending   Cancer of lower lobe of right lung (HCC) --Patient is s/p XRT --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  >50% time spent on counselling and coordination of care  Note: This dictation was prepared with Dragon dictation along with smaller phrase technology. Any transcriptional errors that result from this process are unintentional.  Melvinia Stager M.D    [facility]   CC: Primary care physician; Laurence Pons, NP

## 2024-01-29 NOTE — Plan of Care (Signed)

## 2024-01-29 NOTE — Progress Notes (Signed)
  Echocardiogram 2D Echocardiogram has been performed.  Nichola Barges 01/29/2024, 11:45 AM

## 2024-01-29 NOTE — Plan of Care (Signed)

## 2024-01-29 NOTE — Progress Notes (Signed)
 [facility]    SUBJECTIVE: Patient states to be doing much better today she has less shortness of breath wearing oxygen  no chest pain her leg feels improved resting comfortably in bed with her daughter in room   Vitals:   01/28/24 1943 01/28/24 2333 01/29/24 0424 01/29/24 0759  BP: 106/67 111/74 118/77   Pulse: 85 86 90   Resp: 18 18 18    Temp: 98.6 F (37 C) 97.7 F (36.5 C) 98.4 F (36.9 C) 98.4 F (36.9 C)  TempSrc: Oral Oral Oral Oral  SpO2: 97% 98% 97%   Weight:      Height:         Intake/Output Summary (Last 24 hours) at 01/29/2024 1026 Last data filed at 01/28/2024 1300 Gross per 24 hour  Intake 440 ml  Output --  Net 440 ml      PHYSICAL EXAM  General: Well developed, well nourished, in no acute distress HEENT:  Normocephalic and atramatic Neck:  No JVD.  Lungs: Clear bilaterally to auscultation and percussion. Heart: HRRR . Normal S1 and S2 without gallops or murmurs.  Abdomen: Bowel sounds are positive, abdomen soft and non-tender  Msk:  Back normal, normal gait. Normal strength and tone for age. Extremities: No clubbing, cyanosis or edema.   Neuro: Alert and oriented X 3. Psych:  Good affect, responds appropriately   LABS: Basic Metabolic Panel: Recent Labs    01/27/24 2205 01/29/24 0532  NA  --  137  K  --  3.4*  CL  --  96*  CO2  --  34*  GLUCOSE  --  275*  BUN  --  19  CREATININE  --  0.68  CALCIUM   --  8.5*  MG 1.8  --    Liver Function Tests: No results for input(s): AST, ALT, ALKPHOS, BILITOT, PROT, ALBUMIN in the last 72 hours. No results for input(s): LIPASE, AMYLASE in the last 72 hours. CBC: Recent Labs    01/27/24 2205 01/29/24 0532  WBC 11.8* 12.0*  NEUTROABS 10.1*  --   HGB 10.1* 9.0*  HCT 31.6* 28.7*  MCV 91.3 91.7  PLT 256 300   Cardiac Enzymes: No results for input(s): CKTOTAL, CKMB, CKMBINDEX, TROPONINI in the last 72 hours. BNP: Invalid input(s): POCBNP D-Dimer: Recent Labs     01/27/24 2005  DDIMER 2.36*   Hemoglobin A1C: No results for input(s): HGBA1C in the last 72 hours. Fasting Lipid Panel: No results for input(s): CHOL, HDL, LDLCALC, TRIG, CHOLHDL, LDLDIRECT in the last 72 hours. Thyroid  Function Tests: No results for input(s): TSH, T4TOTAL, T3FREE, THYROIDAB in the last 72 hours.  Invalid input(s): FREET3 Anemia Panel: No results for input(s): VITAMINB12, FOLATE, FERRITIN, TIBC, IRON , RETICCTPCT in the last 72 hours.  US  ARTERIAL ABI (SCREENING LOWER EXTREMITY) Result Date: 01/29/2024 CLINICAL DATA:  Peripheral vascular disease and status post recent left femoral endarterectomy with mechanical thrombectomy of left SFA a, left SFA and popliteal stent placement, tibial peroneal and posterior tibial angioplasty and left external iliac artery stent placement. EXAM: NONINVASIVE PHYSIOLOGIC VASCULAR STUDY OF BILATERAL LOWER EXTREMITIES TECHNIQUE: Evaluation of both lower extremities were performed at rest, including calculation of ankle-brachial indices with single level Doppler, pressure and pulse volume recording. COMPARISON:  Report from a prior study on 12/01/2023 FINDINGS: Right ABI:  0.75.  Previously 0.73 Left ABI:  0.88.  Previously 0.86 Right Lower Extremity:  Monophasic distal waveforms Left Lower Extremity:  Monophasic distal wave 0.5-0.79 Moderate PAD IMPRESSION: Resting left ABI of 0.88 after  recent left lower extremity surgery and endovascular intervention. Stable resting right ABI of 0.75. Electronically Signed   By: Erica Hau M.D.   On: 01/29/2024 10:22   CT Angio Chest Pulmonary Embolism (PE) W or WO Contrast Result Date: 01/28/2024 CLINICAL DATA:  Wheezing EXAM: CT ANGIOGRAPHY CHEST WITH CONTRAST TECHNIQUE: Multidetector CT imaging of the chest was performed using the standard protocol during bolus administration of intravenous contrast. Multiplanar CT image reconstructions and MIPs were obtained to evaluate  the vascular anatomy. RADIATION DOSE REDUCTION: This exam was performed according to the departmental dose-optimization program which includes automated exposure control, adjustment of the mA and/or kV according to patient size and/or use of iterative reconstruction technique. CONTRAST:  OMNIPAQUE  IOHEXOL  350 MG/ML SOLN COMPARISON:  Chest x-ray from the previous day.  CT from 09/29/2023 FINDINGS: Cardiovascular: Thoracic aorta and its branches demonstrate atherosclerotic calcification without aneurysmal dilatation. Coronary calcifications are noted. No cardiac enlargement is seen. The pulmonary artery shows a normal branching pattern bilaterally. No intraluminal filling defect to suggest pulmonary embolism is seen. Mediastinum/Nodes: Thoracic inlet is within normal limits. The esophagus as visualized is unremarkable. No hilar or mediastinal adenopathy is noted. Lungs/Pleura: Lungs are well aerated bilaterally. Diffuse emphysematous changes are seen. Apical pleural and parenchymal scarring noted in the right apex. Diffuse infiltrate is seen within the right middle and lower lobes. These changes are similar to that seen on recent chest x-ray as well as prior CT and felt to be again related to prior radiation therapy. Stable left upper lobe nodule is noted on image number 38 of series 5 measuring 5 mm. Tiny right upper lobe nodule is noted stable in appearance. Upper Abdomen: Visualized upper abdomen is within normal limits. Musculoskeletal: Postsurgical changes are noted in the right chest wall stable from the prior exam. No acute bony abnormality is noted. Review of the MIP images confirms the above findings. IMPRESSION: No evidence of pulmonary emboli. Right lower lobe infiltrate of a chronic nature. Related to prior radiation Stable bilateral pulmonary nodules dating back to August of 2024. Follow-up CT in 6 months is recommended to assess for stability. Aortic Atherosclerosis (ICD10-I70.0) and Emphysema  (ICD10-J43.9). Electronically Signed   By: Violeta Grey M.D.   On: 01/28/2024 03:18   DG Chest 1 View Result Date: 01/27/2024 CLINICAL DATA:  Wheezing. EXAM: CHEST  1 VIEW COMPARISON:  Same day chest radiograph dated 01/27/2024 at 12:24 p.m. FINDINGS: Stable cardiomediastinal silhouette. Similar right mid lung density, most compatible with radiation fibrosis as noted on the prior CT. Slightly increased right basilar opacity could reflect atelectasis, however, a small layering pleural effusion can not be excluded. Left lung is clear. No pneumothorax. Visualized osseous structures are unchanged. IMPRESSION: 1. Slightly increased right basilar opacity could reflect atelectasis, however, a small layering pleural effusion can not be excluded. 2. Similar right mid lung density, most compatible with radiation fibrosis as noted on the prior CT. Electronically Signed   By: Mannie Seek M.D.   On: 01/27/2024 20:13   DG Chest 2 View Result Date: 01/27/2024 CLINICAL DATA:  Bloody sputum. EXAM: CHEST - 2 VIEW COMPARISON:  September 29, 2023.  June 15, 2023. FINDINGS: The heart size and mediastinal contours are within normal limits. Left lung is clear. Stable right apical scarring is noted. Stable right midlung and basilar density is noted most consistent with radiation fibrosis as noted on prior CT scan. No acute osseous abnormality is noted. IMPRESSION: Stable chronic findings noted in right lung. No acute abnormality seen.  Electronically Signed   By: Rosalene Colon M.D.   On: 01/27/2024 13:40     Echo preserved left ventricular function EF of at least 55%  TELEMETRY: Normal sinus rhythm rate of 85:  ASSESSMENT AND PLAN:  Principal Problem:   Ischemic leg Active Problems:   Personal history of tobacco use, presenting hazards to health   Cancer of lower lobe of right lung (HCC)   Elevated troponin   Acute on chronic respiratory failure with hypoxia and hypercapnia (HCC)   Atherosclerosis of artery  of extremity with rest pain (HCC)   S/p femoral endarterectomy 01/21/2024   Chronic heart failure with preserved ejection fraction (HFpEF) (HCC)   SSS (sick sinus syndrome) s/p leadless pacemaker (HCC)    Plan Acute on chronic respiratory failure hypercapnic hypoxic COPD exacerbation with hypoxemia Sick sinus syndrome permanent pacemaker in place [facility] November 2022 continue current therapy have the patient follow-up with pacer clinic Peripheral vascular disease by history status post left femoral artery stenting left femoral artery endarterectomy postop day 8 Chronic diastolic congestive heart failure stable continue current medical therapy Hyperlipidemia maintain high-dose statin therapy Hypertension agree with blood pressure management and control   Antonette Batters, MD, 01/29/2024 10:26 AM

## 2024-01-30 ENCOUNTER — Inpatient Hospital Stay

## 2024-01-30 DIAGNOSIS — J9621 Acute and chronic respiratory failure with hypoxia: Secondary | ICD-10-CM | POA: Diagnosis not present

## 2024-01-30 DIAGNOSIS — I998 Other disorder of circulatory system: Secondary | ICD-10-CM | POA: Diagnosis not present

## 2024-01-30 DIAGNOSIS — R7989 Other specified abnormal findings of blood chemistry: Secondary | ICD-10-CM | POA: Diagnosis not present

## 2024-01-30 DIAGNOSIS — I5032 Chronic diastolic (congestive) heart failure: Secondary | ICD-10-CM | POA: Diagnosis not present

## 2024-01-30 LAB — CBC
HCT: 30.4 % — ABNORMAL LOW (ref 36.0–46.0)
Hemoglobin: 9.4 g/dL — ABNORMAL LOW (ref 12.0–15.0)
MCH: 29.1 pg (ref 26.0–34.0)
MCHC: 30.9 g/dL (ref 30.0–36.0)
MCV: 94.1 fL (ref 80.0–100.0)
Platelets: 324 10*3/uL (ref 150–400)
RBC: 3.23 MIL/uL — ABNORMAL LOW (ref 3.87–5.11)
RDW: 14.3 % (ref 11.5–15.5)
WBC: 10.9 10*3/uL — ABNORMAL HIGH (ref 4.0–10.5)
nRBC: 0 % (ref 0.0–0.2)

## 2024-01-30 LAB — BASIC METABOLIC PANEL WITH GFR
Anion gap: 5 (ref 5–15)
BUN: 22 mg/dL (ref 8–23)
CO2: 32 mmol/L (ref 22–32)
Calcium: 8.5 mg/dL — ABNORMAL LOW (ref 8.9–10.3)
Chloride: 100 mmol/L (ref 98–111)
Creatinine, Ser: 0.69 mg/dL (ref 0.44–1.00)
GFR, Estimated: >60 mL/min (ref >60)
Glucose, Bld: 228 mg/dL — ABNORMAL HIGH (ref 70–99)
Potassium: 4.6 mmol/L (ref 3.5–5.1)
Sodium: 137 mmol/L (ref 135–145)

## 2024-01-30 LAB — GLUCOSE, CAPILLARY
Glucose-Capillary: 250 mg/dL — ABNORMAL HIGH (ref 70–99)
Glucose-Capillary: 167 mg/dL — ABNORMAL HIGH (ref 70–99)
Glucose-Capillary: 254 mg/dL — ABNORMAL HIGH (ref 70–99)
Glucose-Capillary: 327 mg/dL — ABNORMAL HIGH (ref 70–99)

## 2024-01-30 MED ORDER — SULFAMETHOXAZOLE-TRIMETHOPRIM 400-80 MG PO TABS
1.0000 | ORAL_TABLET | Freq: Two times a day (BID) | ORAL | Status: DC
  Administered 2024-01-30 – 2024-02-02 (×7): 1 via ORAL
  Filled 2024-01-30 (×7): qty 1

## 2024-01-30 MED ORDER — TRANEXAMIC ACID FOR INHALATION
500.0000 mg | Freq: Once | RESPIRATORY_TRACT | Status: AC
  Administered 2024-01-30: 500 mg via RESPIRATORY_TRACT
  Filled 2024-01-30: qty 10
  Filled 2024-01-30: qty 5

## 2024-01-30 NOTE — Progress Notes (Signed)
 [facility]  - [place] [facility]   PATIENT NAME: Angela Jensen    MR#:  [id number]  DATE OF BIRTH:  1956/01/27  SUBJECTIVE:  spoke with daughter at bedside. Patient has some hemoptysis mild streaks well coughing up. Denies any sore throat any fever. Has chronic respiratory failure. No distress at present. Tolerating CPAP at night. VITALS:  Blood pressure 126/73, pulse 68, temperature 98 F (36.7 C), resp. rate 18, height 4' 11 (1.499 m), weight 59.9 kg, SpO2 94%.  PHYSICAL EXAMINATION:   GENERAL:  68 y.o.-year-old patient with no acute distress.  LUNGS: distant breath sounds bilaterally, no wheezing CARDIOVASCULAR: S1, S2 normal. No murmur   ABDOMEN: Soft, nontender, nondistended. Bowel sounds present.  EXTREMITIES: No  edema b/l.    NEUROLOGIC: nonfocal  patient is alert and awake   LABORATORY PANEL:  CBC Recent Labs  Lab 01/30/24 0435  WBC 10.9*  HGB 9.4*  HCT 30.4*  PLT 324    Chemistries  Recent Labs  Lab 01/27/24 2205 01/29/24 0532 01/30/24 0435  NA  --    < > 137  K  --    < > 4.6  CL  --    < > 100  CO2  --    < > 32  GLUCOSE  --    < > 228*  BUN  --    < > 22  CREATININE  --    < > 0.69  CALCIUM   --    < > 8.5*  MG 1.8  --   --    < > = values in this interval not displayed.   Cardiac Enzymes No results for input(s): TROPONINI in the last 168 hours. RADIOLOGY:  ECHOCARDIOGRAM COMPLETE Result Date: 01/29/2024    ECHOCARDIOGRAM REPORT   Patient Name:   Angela Jensen [facility] Date of Exam: 01/29/2024 Medical Rec #:  [id number]    Height:       59.0 in Accession #:    [id number]   Weight:       132.1 lb Date of Birth:  04/22/1956    BSA:          1.546 m Patient Age:    68 years     BP:           118/77 mmHg Patient Gender: F            HR:           86 bpm. Exam Location:  [facility] Procedure: 2D Echo (Both Spectral and Color Flow Doppler were utilized during            procedure). Indications:     Elevated Troponin  History:         Patient has prior  history of Echocardiogram examinations, most                  recent 06/15/2023.  Sonographer:     Clenton Czech RDCS, FASE Referring Phys:  [id number] Lanetta Pion Diagnosing Phys: Antonette Batters MD  Sonographer Comments: Technically challenging study due to limited acoustic windows and no apical window. Image acquisition challenging due to respiratory motion. IMPRESSIONS  1. Left ventricular ejection fraction, by estimation, is 60 to 65%. The left ventricle has normal function. The left ventricle has no regional wall motion abnormalities. Left ventricular diastolic parameters are consistent with Grade I diastolic dysfunction (impaired relaxation).  2. Right ventricular systolic function is normal. The right ventricular size is normal.  3. The  mitral valve is normal in structure. Trivial mitral valve regurgitation.  4. The aortic valve is normal in structure. Aortic valve regurgitation is not visualized. FINDINGS  Left Ventricle: Left ventricular ejection fraction, by estimation, is 60 to 65%. The left ventricle has normal function. The left ventricle has no regional wall motion abnormalities. Strain was performed and the global longitudinal strain is indeterminate. Global longitudinal strain performed but not reported based on interpreter judgement due to suboptimal tracking. The left ventricular internal cavity size was normal in size. There is no left ventricular hypertrophy. Left ventricular diastolic parameters are consistent with Grade I diastolic dysfunction (impaired relaxation). Right Ventricle: The right ventricular size is normal. No increase in right ventricular wall thickness. Right ventricular systolic function is normal. Left Atrium: Left atrial size was normal in size. Right Atrium: Right atrial size was normal in size. Pericardium: There is no evidence of pericardial effusion. Mitral Valve: The mitral valve is normal in structure. Trivial mitral valve regurgitation. Tricuspid Valve: The  tricuspid valve is normal in structure. Tricuspid valve regurgitation is trivial. Aortic Valve: The aortic valve is normal in structure. Aortic valve regurgitation is not visualized. Pulmonic Valve: The pulmonic valve was normal in structure. Pulmonic valve regurgitation is not visualized. Aorta: The ascending aorta was not well visualized. IAS/Shunts: No atrial level shunt detected by color flow Doppler. Additional Comments: 3D was performed not requiring image post processing on an independent workstation and was indeterminate.  LEFT VENTRICLE PLAX 2D LVIDd:         4.10 cm   Diastology LVIDs:         2.80 cm   LV e' medial:    6.53 cm/s LV PW:         1.00 cm   LV E/e' medial:  12.0 LV IVS:        0.90 cm   LV e' lateral:   5.87 cm/s LVOT diam:     2.00 cm   LV E/e' lateral: 13.3 LV SV:         44 LV SV Index:   28 LVOT Area:     3.14 cm  LEFT ATRIUM           Index LA diam:      2.70 cm 1.75 cm/m LA Vol (A4C): 9.9 ml  6.37 ml/m  AORTIC VALVE             PULMONIC VALVE LVOT Vmax:   82.20 cm/s  PV Vmax:        1.00 m/s LVOT Vmean:  50.100 cm/s PV Peak grad:   4.0 mmHg LVOT VTI:    0.140 m     RVOT Peak grad: 2 mmHg  AORTA Ao Root diam: 3.00 cm MITRAL VALVE               TRICUSPID VALVE MV Area (PHT): 4.01 cm    TR Peak grad:   25.6 mmHg MV Decel Time: 189 msec    TR Vmax:        253.00 cm/s MV E velocity: 78.10 cm/s MV A velocity: 93.30 cm/s  SHUNTS MV E/A ratio:  0.84        Systemic VTI:  0.14 m                            Systemic Diam: 2.00 cm Antonette Batters MD Electronically signed by Antonette Batters MD Signature Date/Time: 01/29/2024/4:01:12 PM    Final  DG Chest 2 View Result Date: 01/29/2024 CLINICAL DATA:  Short of breath, history of lung cancer status post radiation therapy EXAM: CHEST - 2 VIEW COMPARISON:  01/27/2024 FINDINGS: Frontal and lateral views of the chest demonstrate a stable cardiac silhouette. Areas of consolidation and scarring at the right lung base most consistent with post  radiation changes in this patient with a history of lung cancer. No acute airspace disease, effusion, or pneumothorax. No acute bony abnormalities. IMPRESSION: 1. Stable post therapeutic changes at the right lung base. 2. No acute intrathoracic process. Electronically Signed   By: Bobbye Burrow M.D.   On: 01/29/2024 13:42   US  ARTERIAL ABI (SCREENING LOWER EXTREMITY) Result Date: 01/29/2024 CLINICAL DATA:  Peripheral vascular disease and status post recent left femoral endarterectomy with mechanical thrombectomy of left SFA a, left SFA and popliteal stent placement, tibial peroneal and posterior tibial angioplasty and left external iliac artery stent placement. EXAM: NONINVASIVE PHYSIOLOGIC VASCULAR STUDY OF BILATERAL LOWER EXTREMITIES TECHNIQUE: Evaluation of both lower extremities were performed at rest, including calculation of ankle-brachial indices with single level Doppler, pressure and pulse volume recording. COMPARISON:  Report from a prior study on 12/01/2023 FINDINGS: Right ABI:  0.75.  Previously 0.73 Left ABI:  0.88.  Previously 0.86 Right Lower Extremity:  Monophasic distal waveforms Left Lower Extremity:  Monophasic distal wave 0.5-0.79 Moderate PAD IMPRESSION: Resting left ABI of 0.88 after recent left lower extremity surgery and endovascular intervention. Stable resting right ABI of 0.75. Electronically Signed   By: Erica Hau M.D.   On: 01/29/2024 10:22    Assessment and Plan Angela Jensen is a 68 y.o. female with past medical history of   SSS s/p micra (06/2021), Lung cancer s/p XRT, hx of recurrent pneumothorax, hypertension,  chronic HFpEF, HTN,  IDDM,, COPD/asthma (baseline 2L O2), OSA,, tobacco use,  PVD s/p left femoral endarterectomy 01/21/24 (prior preop cardiac clearance on 6/5) now receiving  PT/OT without event, being seen in consultation for shortness of breath, resulting in a rapid response on the evening of 01/27/2024.   Acute on chronic respiratory failure with hypoxia and  hypercapnia (HCC) -Possible COPD exacerbation, superimposed on OSA --CTA chest negative for PE, infiltrate or fluid overload-stable from prior CT and chest x-ray --Patient wears 3 L at baseline and CPAP at night --BNP normal and no evidence of interstitial edema to suggest CHF.   --on prednsione 20 mg every day --Continue BiPAP and wean as tolerated --DuoNeb as needed --tolerating po diet, breathing back to baseline  Hemoptysis--Non massive -- consulted pulmonary Dr. Alana Hoyle. Recommends continue PO prednisone  chronic and empiric antibiotic with Bactrim. -- Hemoglobin stable -- patient on Plavix  for PAD will continue for now  Elevated troponin --Troponin 50-->133-->261, EKG nonacute, patient without chest pain --Recently had cardiac clearance preoperatively on 6/2 --Suspecting demand ischemia from acute respiratory distress --Echocardiogram EG 60-65% no WMA --[facility] consulted--no further w/u needed at present   S/p femoral endarterectomy 01/21/2024 S/p left femoral endarterectomy, left iliac stent, and left SFA, popliteal, and posterior tibial interventions  Atherosclerosis on rest pain/ischemic limb --Management per vascular   SSS (sick sinus syndrome) s/p leadless pacemaker (HCC) --No acute issues suspected   Chronic heart failure with preserved ejection fraction (HFpEF) (HCC) --CTA chest not showing evidence of fluid overload, BNP normal --Clinically euvolemic --Continue home GDMT --Echo 60-65% EF   Cancer of lower lobe of right lung (HCC) --Patient is s/p XRT --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  >50% time spent on counselling and coordination of care  Note: This dictation was prepared with Dragon dictation along with smaller phrase technology. Any transcriptional errors that result from this process are unintentional.  Melvinia Stager M.D    [facility]   CC: Primary  care physician; Laurence Pons, NP

## 2024-01-30 NOTE — Plan of Care (Signed)

## 2024-01-30 NOTE — Progress Notes (Signed)
 [facility]    SUBJECTIVE: Patient feeling much improved still has shortness of breath but feels better was seen evaluated by pulmonary and needs to have new inhalers no chest pain leg feels okay from recent peripheral vascular disease intervention sitting up in bed daughter in the room   Vitals:   01/29/24 1607 01/29/24 2024 01/30/24 0008 01/30/24 0405  BP: (!) 147/96 (!) 153/81 132/75 (!) 141/87  Pulse: 82 92 87 77  Resp:  19 19 18   Temp: 98.4 F (36.9 C) 98.3 F (36.8 C) (!) 97.2 F (36.2 C) 98 F (36.7 C)  TempSrc:      SpO2: 97% 95% 95% 99%  Weight:      Height:         Intake/Output Summary (Last 24 hours) at 01/30/2024 1478 Last data filed at 01/29/2024 2257 Gross per 24 hour  Intake 720 ml  Output 200 ml  Net 520 ml      PHYSICAL EXAM  General: Well developed, well nourished, in no acute distress HEENT:  Normocephalic and atramatic Neck:  No JVD.  Lungs: Diminished bilaterally to auscultation and percussion.  Diffuse rhonchi Heart: HRRR . Normal S1 and S2 without gallops or murmurs.  Abdomen: Bowel sounds are positive, abdomen soft and non-tender  Msk:  Back normal, normal gait. Normal strength and tone for age. Extremities: No clubbing, cyanosis or edema.   Neuro: Alert and oriented X 3. Psych:  Good affect, responds appropriately   LABS: Basic Metabolic Panel: Recent Labs    01/27/24 2205 01/29/24 0532 01/30/24 0435  NA  --  137 137  K  --  3.4* 4.6  CL  --  96* 100  CO2  --  34* 32  GLUCOSE  --  275* 228*  BUN  --  19 22  CREATININE  --  0.68 0.69  CALCIUM   --  8.5* 8.5*  MG 1.8  --   --    Liver Function Tests: No results for input(s): AST, ALT, ALKPHOS, BILITOT, PROT, ALBUMIN in the last 72 hours. No results for input(s): LIPASE, AMYLASE in the last 72 hours. CBC: Recent Labs    01/27/24 2205 01/29/24 0532 01/30/24 0435  WBC 11.8* 12.0* 10.9*  NEUTROABS 10.1*  --   --   HGB 10.1* 9.0* 9.4*  HCT 31.6* 28.7* 30.4*   MCV 91.3 91.7 94.1  PLT 256 300 324   Cardiac Enzymes: No results for input(s): CKTOTAL, CKMB, CKMBINDEX, TROPONINI in the last 72 hours. BNP: Invalid input(s): POCBNP D-Dimer: Recent Labs    01/27/24 2005  DDIMER 2.36*   Hemoglobin A1C: No results for input(s): HGBA1C in the last 72 hours. Fasting Lipid Panel: No results for input(s): CHOL, HDL, LDLCALC, TRIG, CHOLHDL, LDLDIRECT in the last 72 hours. Thyroid  Function Tests: No results for input(s): TSH, T4TOTAL, T3FREE, THYROIDAB in the last 72 hours.  Invalid input(s): FREET3 Anemia Panel: No results for input(s): VITAMINB12, FOLATE, FERRITIN, TIBC, IRON , RETICCTPCT in the last 72 hours.  ECHOCARDIOGRAM COMPLETE Result Date: 01/29/2024    ECHOCARDIOGRAM REPORT   Patient Name:   Angela Jensen [facility] Date of Exam: 01/29/2024 Medical Rec #:  [id number]    Height:       59.0 in Accession #:    [id number]   Weight:       132.1 lb Date of Birth:  28-May-1956    BSA:          1.546 m Patient Age:    68 years  BP:           118/77 mmHg Patient Gender: F            HR:           86 bpm. Exam Location:  [facility] Procedure: 2D Echo (Both Spectral and Color Flow Doppler were utilized during            procedure). Indications:     Elevated Troponin  History:         Patient has prior history of Echocardiogram examinations, most                  recent 06/15/2023.  Sonographer:     Clenton Czech RDCS, FASE Referring Phys:  [id number] Lanetta Pion Diagnosing Phys: Antonette Batters MD  Sonographer Comments: Technically challenging study due to limited acoustic windows and no apical window. Image acquisition challenging due to respiratory motion. IMPRESSIONS  1. Left ventricular ejection fraction, by estimation, is 60 to 65%. The left ventricle has normal function. The left ventricle has no regional wall motion abnormalities. Left ventricular diastolic parameters are consistent with Grade I diastolic dysfunction  (impaired relaxation).  2. Right ventricular systolic function is normal. The right ventricular size is normal.  3. The mitral valve is normal in structure. Trivial mitral valve regurgitation.  4. The aortic valve is normal in structure. Aortic valve regurgitation is not visualized. FINDINGS  Left Ventricle: Left ventricular ejection fraction, by estimation, is 60 to 65%. The left ventricle has normal function. The left ventricle has no regional wall motion abnormalities. Strain was performed and the global longitudinal strain is indeterminate. Global longitudinal strain performed but not reported based on interpreter judgement due to suboptimal tracking. The left ventricular internal cavity size was normal in size. There is no left ventricular hypertrophy. Left ventricular diastolic parameters are consistent with Grade I diastolic dysfunction (impaired relaxation). Right Ventricle: The right ventricular size is normal. No increase in right ventricular wall thickness. Right ventricular systolic function is normal. Left Atrium: Left atrial size was normal in size. Right Atrium: Right atrial size was normal in size. Pericardium: There is no evidence of pericardial effusion. Mitral Valve: The mitral valve is normal in structure. Trivial mitral valve regurgitation. Tricuspid Valve: The tricuspid valve is normal in structure. Tricuspid valve regurgitation is trivial. Aortic Valve: The aortic valve is normal in structure. Aortic valve regurgitation is not visualized. Pulmonic Valve: The pulmonic valve was normal in structure. Pulmonic valve regurgitation is not visualized. Aorta: The ascending aorta was not well visualized. IAS/Shunts: No atrial level shunt detected by color flow Doppler. Additional Comments: 3D was performed not requiring image post processing on an independent workstation and was indeterminate.  LEFT VENTRICLE PLAX 2D LVIDd:         4.10 cm   Diastology LVIDs:         2.80 cm   LV e' medial:    6.53  cm/s LV PW:         1.00 cm   LV E/e' medial:  12.0 LV IVS:        0.90 cm   LV e' lateral:   5.87 cm/s LVOT diam:     2.00 cm   LV E/e' lateral: 13.3 LV SV:         44 LV SV Index:   28 LVOT Area:     3.14 cm  LEFT ATRIUM           Index LA diam:  2.70 cm 1.75 cm/m LA Vol (A4C): 9.9 ml  6.37 ml/m  AORTIC VALVE             PULMONIC VALVE LVOT Vmax:   82.20 cm/s  PV Vmax:        1.00 m/s LVOT Vmean:  50.100 cm/s PV Peak grad:   4.0 mmHg LVOT VTI:    0.140 m     RVOT Peak grad: 2 mmHg  AORTA Ao Root diam: 3.00 cm MITRAL VALVE               TRICUSPID VALVE MV Area (PHT): 4.01 cm    TR Peak grad:   25.6 mmHg MV Decel Time: 189 msec    TR Vmax:        253.00 cm/s MV E velocity: 78.10 cm/s MV A velocity: 93.30 cm/s  SHUNTS MV E/A ratio:  0.84        Systemic VTI:  0.14 m                            Systemic Diam: 2.00 cm Antonette Batters MD Electronically signed by Antonette Batters MD Signature Date/Time: 01/29/2024/4:01:12 PM    Final    DG Chest 2 View Result Date: 01/29/2024 CLINICAL DATA:  Short of breath, history of lung cancer status post radiation therapy EXAM: CHEST - 2 VIEW COMPARISON:  01/27/2024 FINDINGS: Frontal and lateral views of the chest demonstrate a stable cardiac silhouette. Areas of consolidation and scarring at the right lung base most consistent with post radiation changes in this patient with a history of lung cancer. No acute airspace disease, effusion, or pneumothorax. No acute bony abnormalities. IMPRESSION: 1. Stable post therapeutic changes at the right lung base. 2. No acute intrathoracic process. Electronically Signed   By: Bobbye Burrow M.D.   On: 01/29/2024 13:42   US  ARTERIAL ABI (SCREENING LOWER EXTREMITY) Result Date: 01/29/2024 CLINICAL DATA:  Peripheral vascular disease and status post recent left femoral endarterectomy with mechanical thrombectomy of left SFA a, left SFA and popliteal stent placement, tibial peroneal and posterior tibial angioplasty and left external  iliac artery stent placement. EXAM: NONINVASIVE PHYSIOLOGIC VASCULAR STUDY OF BILATERAL LOWER EXTREMITIES TECHNIQUE: Evaluation of both lower extremities were performed at rest, including calculation of ankle-brachial indices with single level Doppler, pressure and pulse volume recording. COMPARISON:  Report from a prior study on 12/01/2023 FINDINGS: Right ABI:  0.75.  Previously 0.73 Left ABI:  0.88.  Previously 0.86 Right Lower Extremity:  Monophasic distal waveforms Left Lower Extremity:  Monophasic distal wave 0.5-0.79 Moderate PAD IMPRESSION: Resting left ABI of 0.88 after recent left lower extremity surgery and endovascular intervention. Stable resting right ABI of 0.75. Electronically Signed   By: Erica Hau M.D.   On: 01/29/2024 10:22     Echo showed left ventricular function EF of 60%  TELEMETRY: Normal sinus rhythm interventricular conduction delay rate of 85:  ASSESSMENT AND PLAN:  Principal Problem:   Ischemic leg Active Problems:   Personal history of tobacco use, presenting hazards to health   Cancer of lower lobe of right lung (HCC)   Elevated troponin   Acute on chronic respiratory failure with hypoxia and hypercapnia (HCC)   Atherosclerosis of artery of extremity with rest pain (HCC)   S/p femoral endarterectomy 01/21/2024   Chronic heart failure with preserved ejection fraction (HFpEF) (HCC)   SSS (sick sinus syndrome) s/p leadless pacemaker [facility])    Plan Agree  with telemetry for management function shortness of breath dyspnea peripheral vascular disease Shortness of breath most related to COPD end-stage lung disease continue supplemental oxygen  Reviewed with pulmonary evaluation and management for significant lung disease Peripheral vascular disease intervention appears to be healing well patient will need walking exercises Evidence of diastolic dysfunction shortness of breath mostly related to lung disease not congestive heart failure Sick sinus syndrome permanent  pacemaker in place with a Micra will follow-up for device clinic Continue to advised patient refrain from tobacco abuse Consider discharge from a cardiac standpoint have the patient follow-up with cardiology 1 to 2 weeks   Antonette Batters, MD 01/30/2024 7:33 AM

## 2024-01-30 NOTE — Consult Note (Signed)
 PULMONOLOGY         Date: 01/30/2024,   MRN# [id number] Angela Jensen 1956-04-07     AdmissionWeight: 54 kg                 CurrentWeight: 59.9 kg  Referring provider: Dr Melvinia Stager   CHIEF COMPLAINT:   Non-massive hemoptysis   HISTORY OF PRESENT ILLNESS   68 year old lady with a history of asthma and COPD overlap coronary artery disease on antiplatelet agents recent onset of nonmassive hemoptysis and acute on chronic hypoxemia.  Also has a background history of dyslipidemia chronic hypoxemia requiring 2 L supplemental O2 at night peripheral vascular disease sleep apnea history of lung cancer on the right status post full scope of treatment came in with COPD with acute on chronic exacerbation with hypoxemia as well as non massive hemoptysis consultation for pulmonary placed due to ongoing NON massive hemoptysis with hypoxemia.   PAST MEDICAL HISTORY   Past Medical History:  Diagnosis Date   Anemia    Arthritis    Asthma    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/2016   Cancer (HCC) 2012   Right Lung CA   COPD (chronic obstructive pulmonary disease) (HCC)    Depression    Diabetes mellitus without complication (HCC)    Patient takes Janumet   Essential hypertension 05/26/2016   Heart failure (HCC) 2022   Hydropneumothorax 05/03/2020   Hypercholesteremia    Oxygen  dependent    2L at nite    PAD (peripheral artery disease) (HCC) 06/22/2016   Peripheral vascular disease (HCC)    Personal history of radiation therapy    Shortness of breath dyspnea    with exertion    Sialolithiasis    Sleep apnea    Wears dentures    full upper and lower     SURGICAL HISTORY   Past Surgical History:  Procedure Laterality Date   APPLICATION OF CELL SAVER Left 01/21/2024   Procedure: APPLICATION OF CELL SAVER;  Surgeon: Celso College, MD;  Location: [facility];  Service: Vascular;  Laterality: Left;   CESAREAN SECTION      x3   COLONOSCOPY WITH PROPOFOL  N/A 06/25/2015   Procedure: COLONOSCOPY WITH PROPOFOL ;  Surgeon: Marnee Sink, MD;  Location: [facility];  Service: Endoscopy;  Laterality: N/A;   COLONOSCOPY WITH PROPOFOL  N/A 07/26/2020   Procedure: COLONOSCOPY WITH PROPOFOL ;  Surgeon: Marnee Sink, MD;  Location: [facility];  Service: Endoscopy;  Laterality: N/A;   CYST REMOVAL LEG     and on shoulder    ENDARTERECTOMY FEMORAL Left 01/21/2024   Procedure: LEFT COMMON, SUPERFICIAL FEMORAL AND PROFUNDA FEMORIS ENDARTERECTOMY WITH BOVINE PERICARDIAL PATCH ANGIOPLASTY;  Surgeon: Celso College, MD;  Location: [facility];  Service: Vascular;  Laterality: Left;   ESOPHAGOGASTRODUODENOSCOPY (EGD) WITH PROPOFOL  N/A 07/26/2020   Procedure: ESOPHAGOGASTRODUODENOSCOPY (EGD) WITH PROPOFOL ;  Surgeon: Marnee Sink, MD;  Location: [facility];  Service: Endoscopy;  Laterality: N/A;  Diabetic - oral meds   INSERTION OF ILIAC STENT Left 01/21/2024   Procedure: INSERTION, STENT, ARTERY, ILIAC;  Surgeon: Celso College, MD;  Location: [facility];  Service: Vascular;  Laterality: Left;  AND SFA STENTS   LOWER EXTREMITY ANGIOGRAPHY Left 09/29/2018   Procedure: LOWER EXTREMITY ANGIOGRAPHY;  Surgeon: Celso College, MD;  Location: [facility];  Service: Cardiovascular;  Laterality: Left;   LOWER EXTREMITY ANGIOGRAPHY Left 07/29/2023   Procedure: Lower Extremity Angiography;  Surgeon:  Celso College, MD;  Location: [facility];  Service: Cardiovascular;  Laterality: Left;   LOWER EXTREMITY ANGIOGRAPHY Right 12/20/2023   Procedure: Lower Extremity Angiography;  Surgeon: Celso College, MD;  Location: [facility];  Service: Cardiovascular;  Laterality: Right;   LOWER EXTREMITY ANGIOGRAPHY Left 01/17/2024   Procedure: Lower Extremity Angiography;  Surgeon: Celso College, MD;  Location: [facility];  Service: Cardiovascular;  Laterality: Left;   LUNG BIOPSY  12/30/2011   has lung spots    PACEMAKER IMPLANT  07/14/2021   PACEMAKER LEADLESS INSERTION N/A 07/14/2021   Procedure: PACEMAKER LEADLESS INSERTION;  Surgeon: Percival Brace, MD;  Location: [facility];  Service: Cardiovascular;  Laterality: N/A;   PERIPHERAL VASCULAR CATHETERIZATION Left 06/01/2016   Procedure: Lower Extremity Angiography;  Surgeon: Celso College, MD;  Location: [facility];  Service: Cardiovascular;  Laterality: Left;   PERIPHERAL VASCULAR CATHETERIZATION N/A 06/01/2016   Procedure: Abdominal Aortogram w/Lower Extremity;  Surgeon: Celso College, MD;  Location: [facility];  Service: Cardiovascular;  Laterality: N/A;   PERIPHERAL VASCULAR CATHETERIZATION  06/01/2016   Procedure: Lower Extremity Intervention;  Surgeon: Celso College, MD;  Location: [facility];  Service: Cardiovascular;;   PERIPHERAL VASCULAR CATHETERIZATION Right 06/08/2016   Procedure: Lower Extremity Angiography;  Surgeon: Celso College, MD;  Location: [facility];  Service: Cardiovascular;  Laterality: Right;   PERIPHERAL VASCULAR CATHETERIZATION  06/08/2016   Procedure: Lower Extremity Intervention;  Surgeon: Celso College, MD;  Location: [facility];  Service: Cardiovascular;;   SUBMANDIBULAR GLAND EXCISION Left 12/06/2020   Procedure: EXCISION SUBMANDIBULAR GLAND;  Surgeon: Lesly Raspberry, MD;  Location: [facility];  Service: ENT;  Laterality: Left;  needs to be first case Diabetic - diet controlled   TEMPORARY PACEMAKER N/A 07/11/2021   Procedure: TEMPORARY PACEMAKER;  Surgeon: Percival Brace, MD;  Location: [facility];  Service: Cardiovascular;  Laterality: N/A;     FAMILY HISTORY   Family History  Problem Relation Age of Onset   Diabetes Mother    Hypercholesterolemia Mother    Lung cancer Father    Diabetes Sister    Diabetes Sister    Hypertension Sister    Diabetes Maternal Grandmother    Diabetes Paternal Grandmother    Heart attack Brother     Coronary artery disease Brother    Vascular Disease Brother    Heart attack Brother    Breast cancer Neg Hx      SOCIAL HISTORY   Social History   Tobacco Use   Smoking status: Former    Current packs/day: 0.00    Average packs/day: 1 pack/day for 37.0 years (37.0 ttl pk-yrs)    Types: Cigarettes    Start date: 02/06/1973    Quit date: 02/06/2010    Years since quitting: 13.9   Smokeless tobacco: Former    Types: Snuff  Vaping Use   Vaping status: Never Used  Substance Use Topics   Alcohol use: Not Currently    Alcohol/week: 5.0 standard drinks of alcohol    Types: 5 Cans of beer per week    Comment: /h x of alcohol abuse -stopped 2012- now drinks 5 beer per week   Drug use: Not Currently    Types: Marijuana, Crack cocaine, Cocaine    Comment: hx of cocaine use- last use 2015; last use marijuana6/22/19,     MEDICATIONS    Home Medication:  Current Outpatient Rx   Order #: [id number] Class: Normal    Current Medication:  Current Facility-Administered Medications:    acetaminophen  (TYLENOL ) tablet 325-650 mg, 325-650 mg, Oral, Q4H PRN **OR** acetaminophen  (TYLENOL ) suppository 325-650 mg, 325-650 mg, Rectal, Q4H PRN, Vonna Guardian, Donald Frost, MD   albuterol  (PROVENTIL ) (2.5 MG/3ML) 0.083% nebulizer solution 2.5 mg, 2.5 mg, Nebulization, Q6H, Dew, Jason S, MD, 2.5 mg at 01/30/24 4010   ALPRAZolam  (XANAX ) tablet 0.5 mg, 0.5 mg, Oral, BID PRN, Dew, Jason S, MD, 0.5 mg at 01/29/24 1627   alum & mag hydroxide-simeth (MAALOX/MYLANTA) 200-200-20 MG/5ML suspension 15-30 mL, 15-30 mL, Oral, Q2H PRN, Vonna Guardian, Donald Frost, MD   aspirin  EC tablet 81 mg, 81 mg, Oral, Daily, Dew, Jason S, MD, 81 mg at 01/30/24 0858   atorvastatin  (LIPITOR) tablet 10 mg, 10 mg, Oral, Daily, Dew, Jason S, MD, 10 mg at 01/30/24 0858   calcium  carbonate (OS-CAL - dosed in mg of elemental calcium ) tablet 1,250 mg, 500 mg of elemental calcium , Oral, BID WC, Dew, Jason S, MD, 1,250 mg at 01/30/24 0858   cholecalciferol   (VITAMIN D3) 25 MCG (1000 UNIT) tablet 1,000 Units, 1,000 Units, Oral, q AM, Vonna Guardian, Jason S, MD, 1,000 Units at 01/30/24 2725   clopidogrel  (PLAVIX ) tablet 75 mg, 75 mg, Oral, Daily, Pace, Brien R, NP, 75 mg at 01/30/24 3664   cyclobenzaprine  (FLEXERIL ) tablet 10 mg, 10 mg, Oral, QHS PRN, Celso College, MD   diphenhydrAMINE  (BENADRYL ) capsule 25 mg, 25 mg, Oral, Q6H PRN, Dew, Jason S, MD, 25 mg at 01/23/24 2201   escitalopram  (LEXAPRO ) tablet 10 mg, 10 mg, Oral, Daily, Dew, Jason S, MD, 10 mg at 01/30/24 4034   famotidine  (PEPCID ) tablet 20 mg, 20 mg, Oral, Daily PRN, Celso College, MD   ferrous sulfate  tablet 325 mg, 325 mg, Oral, BID WC, Dew, Jason S, MD, 325 mg at 01/30/24 7425   fluticasone  furoate-vilanterol (BREO ELLIPTA ) 100-25 MCG/ACT 1 puff, 1 puff, Inhalation, Daily, Dew, Donald Frost, MD, 1 puff at 01/30/24 0859   gabapentin  (NEURONTIN ) capsule 300 mg, 300 mg, Oral, TID, Dew, Jason S, MD, 300 mg at 01/30/24 9563   guaiFENesin -dextromethorphan (ROBITUSSIN DM) 100-10 MG/5ML syrup 15 mL, 15 mL, Oral, Q4H PRN, Dew, Donald Frost, MD   hydrALAZINE  (APRESOLINE ) injection 5 mg, 5 mg, Intravenous, Q20 Min PRN, Dew, Donald Frost, MD   insulin  aspart (novoLOG ) injection 0-20 Units, 0-20 Units, Subcutaneous, TID AC & HS, Elisabeth Guild, NP, 7 Units at 01/30/24 0859   labetalol  (NORMODYNE ) injection 10 mg, 10 mg, Intravenous, Q10 min PRN, Vonna Guardian, Donald Frost, MD   losartan (COZAAR) tablet 25 mg, 25 mg, Oral, Daily, Decoste, Gabriella, PA-C, 25 mg at 01/30/24 8756   magnesium  hydroxide (MILK OF MAGNESIA) suspension 30 mL, 30 mL, Oral, Daily PRN, Celso College, MD, 30 mL at 01/25/24 1131   metoprolol  succinate (TOPROL -XL) 24 hr tablet 50 mg, 50 mg, Oral, Daily, Decoste, Gabriella, PA-C, 50 mg at 01/30/24 4332   metoprolol  tartrate (LOPRESSOR ) injection 2-5 mg, 2-5 mg, Intravenous, Q2H PRN, Dew, Donald Frost, MD   ondansetron  (ZOFRAN ) injection 4 mg, 4 mg, Intravenous, Q6H PRN, Celso College, MD, 4 mg at 01/18/24 1447    oxyCODONE -acetaminophen  (PERCOCET/ROXICET) 5-325 MG per tablet 1-2 tablet, 1-2 tablet, Oral, Q4H PRN, Dew, Jason S, MD, 2 tablet at 01/30/24 0417   pantoprazole  (PROTONIX ) EC tablet 40 mg, 40 mg, Oral, Daily, Dew, Jason S, MD, 40 mg at 01/30/24 0858   phenol (CHLORASEPTIC) mouth spray 1  spray, 1 spray, Mouth/Throat, PRN, Dew, Donald Frost, MD   predniSONE  (DELTASONE ) tablet 20 mg, 20 mg, Oral, Q breakfast, Patel, Sona, MD, 20 mg at 01/30/24 6578   sodium phosphate  (FLEET) enema 1 enema, 1 enema, Rectal, Daily PRN, Vonna Guardian, Donald Frost, MD   sorbitol  70 % solution 30 mL, 30 mL, Oral, Daily PRN, Vonna Guardian, Donald Frost, MD   umeclidinium bromide  (INCRUSE ELLIPTA ) 62.5 MCG/ACT 1 puff, 1 puff, Inhalation, Daily, Dew, Donald Frost, MD, 1 puff at 01/30/24 0859   vitamin B-12 (CYANOCOBALAMIN ) tablet 100 mcg, 100 mcg, Oral, Daily, Dew, Donald Frost, MD, 100 mcg at 01/30/24 4696    ALLERGIES   Iodinated contrast media, Trelegy ellipta  [fluticasone -umeclidin-vilant], and Bactoshield chg [chlorhexidine  gluconate]     REVIEW OF SYSTEMS    Review of Systems:  Gen:  Denies  fever, sweats, chills weigh loss  HEENT: Denies blurred vision, double vision, ear pain, eye pain, hearing loss, nose bleeds, sore throat Cardiac:  No dizziness, chest pain or heaviness, chest tightness,edema Resp:   reports dyspnea chronically  Gi: Denies swallowing difficulty, stomach pain, nausea or vomiting, diarrhea, constipation, bowel incontinence Gu:  Denies bladder incontinence, burning urine Ext:   Denies Joint pain, stiffness or swelling Skin: Denies  skin rash, easy bruising or bleeding or hives Endoc:  Denies polyuria, polydipsia , polyphagia or weight change Psych:   Denies depression, insomnia or hallucinations   Other:  All other systems negative   VS: BP 118/69 (BP Location: Left Arm)   Pulse 88   Temp 98.1 F (36.7 C)   Resp 18   Ht 4' 11 (1.499 m)   Wt 59.9 kg   SpO2 95%   BMI 26.67 kg/m      PHYSICAL EXAM    GENERAL:NAD,  no fevers, chills, no weakness no fatigue HEAD: Normocephalic, atraumatic.  EYES: Pupils equal, round, reactive to light. Extraocular muscles intact. No scleral icterus.  MOUTH: Moist mucosal membrane. Dentition intact. No abscess noted.  EAR, NOSE, THROAT: Clear without exudates. No external lesions.  NECK: Supple. No thyromegaly. No nodules. No JVD.  PULMONARY: decreased breath sounds with mild rhonchi worse at bases bilaterally.  CARDIOVASCULAR: S1 and S2. Regular rate and rhythm. No murmurs, rubs, or gallops. No edema. Pedal pulses 2+ bilaterally.  GASTROINTESTINAL: Soft, nontender, nondistended. No masses. Positive bowel sounds. No hepatosplenomegaly.  MUSCULOSKELETAL: No swelling, clubbing, or edema. Range of motion full in all extremities.  NEUROLOGIC: Cranial nerves II through XII are intact. No gross focal neurological deficits. Sensation intact. Reflexes intact.  SKIN: No ulceration, lesions, rashes, or cyanosis. Skin warm and dry. Turgor intact.  PSYCHIATRIC: Mood, affect within normal limits. The patient is awake, alert and oriented x 3. Insight, judgment intact.       IMAGING     ASSESSMENT/PLAN   Non massive hemoptysis     - patient is on antiplatelet agent but has blood streaked cough only.  Will treat with nebulized tXA x one dose for now    - there is a right lower lobe infiltrate , I think she would benefit from empiric treatment with bactrim PO BID to cover for cAP and MRSA    - continue incentive spirometry    COPD with centrilobular emphysema    - continue albuterol  and incruse ellipta     - continue prednisone  20mg       Right lower lobe atelectasis    - continue incentive spirometry    Thank you for allowing me to participate in the care of  this patient.   Patient/Family are satisfied with care plan and all questions have been answered.    Provider disclosure: Patient with at least one acute or chronic illness or injury that poses a threat to life or  bodily function and is being managed actively during this encounter.  All of the below services have been performed independently by signing provider:  review of prior documentation from internal and or external health records.  Review of previous and current lab results.  Interview and comprehensive assessment during patient visit today. Review of current and previous chest radiographs/CT scans. Discussion of management and test interpretation with health care team and patient/family.   This document was prepared using Dragon voice recognition software and may include unintentional dictation errors.     Syona Wroblewski, M.D.  Division of Pulmonary & Critical Care Medicine

## 2024-01-30 NOTE — Progress Notes (Signed)
 Pt has coughing up bloody sputum, pt on aspirin  and plavix . per pt she has been having this bloody sputum before but more red tinge this morning. Took a picture and send to it media. Pt came in for left LE ischemia with intervention. Notified NP, no other concern at the moment. Plan of care continued.

## 2024-01-30 NOTE — Progress Notes (Addendum)
      Daily Progress Note   Assessment/Planning:   POD #9 s/p L fem EA w/ BPA, PTA+S L EIA, L-fem pop mech TE and PTA+S, Mech TE and PTA L TPT and PTA   Breathing better today Coughing up blood overnight Given prior lung cancer hx, would consider CT lung.  Will have to do without contrast as pt has IV contrast allerge. Clinically px isn't suggestive of any particular infectious etiology ABI still not done Further recommendations pending studies appreciate input from Hospitalists and Cardiology   Subjective  - 9 Days Post-Op   Coughing up blood overnight   Objective   Vitals:   01/30/24 0008 01/30/24 0405 01/30/24 0733 01/30/24 0832  BP: 132/75 (!) 141/87  118/69  Pulse: 87 77  88  Resp: 19 18    Temp: (!) 97.2 F (36.2 C) 98 F (36.7 C)  98.1 F (36.7 C)  TempSrc:      SpO2: 95% 99% 90% 95%  Weight:      Height:         Intake/Output Summary (Last 24 hours) at 01/30/2024 0834 Last data filed at 01/29/2024 2257 Gross per 24 hour  Intake 720 ml  Output --  Net 720 ml    PULM  CTAB, CXR (2-v): no obvious effusions or infilitrates, may some lower lobe atx  CV  RRR  GI  soft, NTND  VASC LEFT groin inc bandaged, warm L foot  NEURO Intact motor and sensation    Laboratory   CBC    Latest Ref Rng & Units 01/30/2024    4:35 AM 01/29/2024    5:32 AM 01/27/2024   10:05 PM  CBC  WBC 4.0 - 10.5 K/uL 10.9  12.0  11.8   Hemoglobin 12.0 - 15.0 g/dL 9.4  9.0  29.5   Hematocrit 36.0 - 46.0 % 30.4  28.7  31.6   Platelets 150 - 400 K/uL 324  300  256     BMET    Component Value Date/Time   NA 137 01/30/2024 0435   NA 144 10/04/2023 1047   NA 141 04/17/2014 1055   K 4.6 01/30/2024 0435   K 3.9 04/17/2014 1055   CL 100 01/30/2024 0435   CL 102 04/17/2014 1055   CO2 32 01/30/2024 0435   CO2 30 04/17/2014 1055   GLUCOSE 228 (H) 01/30/2024 0435   GLUCOSE 201 (H) 04/17/2014 1055   BUN 22 01/30/2024 0435   BUN 12 10/04/2023 1047   BUN 10 04/17/2014 1055    CREATININE 0.69 01/30/2024 0435   CREATININE 0.84 07/06/2023 1016   CREATININE 0.90 04/17/2014 1055   CALCIUM  8.5 (L) 01/30/2024 0435   CALCIUM  9.1 04/17/2014 1055   GFRNONAA >60 01/30/2024 0435   GFRNONAA >60 07/06/2023 1016   GFRNONAA >60 04/17/2014 1055   GFRAA >60 05/05/2020 0558   GFRAA >60 04/17/2014 1055     Roxy Cordial, MD, FACS, FSVS Covering for [facility]: [phone]  01/30/2024, 8:34 AM

## 2024-01-30 NOTE — Plan of Care (Signed)
  Problem: Clinical Measurements: Goal: Ability to maintain clinical measurements within normal limits will improve Outcome: Progressing Goal: Will remain free from infection Outcome: Progressing Goal: Respiratory complications will improve Outcome: Progressing Goal: Cardiovascular complication will be avoided Outcome: Progressing   Problem: Pain Managment: Goal: General experience of comfort will improve and/or be controlled Outcome: Progressing   Problem: Safety: Goal: Ability to remain free from injury will improve Outcome: Progressing

## 2024-01-31 ENCOUNTER — Telehealth: Payer: Self-pay

## 2024-01-31 ENCOUNTER — Other Ambulatory Visit: Payer: Self-pay

## 2024-01-31 ENCOUNTER — Encounter: Payer: Self-pay | Admitting: Oncology

## 2024-01-31 DIAGNOSIS — I998 Other disorder of circulatory system: Secondary | ICD-10-CM | POA: Diagnosis not present

## 2024-01-31 DIAGNOSIS — R7989 Other specified abnormal findings of blood chemistry: Secondary | ICD-10-CM | POA: Diagnosis not present

## 2024-01-31 DIAGNOSIS — I5032 Chronic diastolic (congestive) heart failure: Secondary | ICD-10-CM | POA: Diagnosis not present

## 2024-01-31 DIAGNOSIS — J9621 Acute and chronic respiratory failure with hypoxia: Secondary | ICD-10-CM | POA: Diagnosis not present

## 2024-01-31 LAB — GLUCOSE, CAPILLARY
Glucose-Capillary: 165 mg/dL — ABNORMAL HIGH (ref 70–99)
Glucose-Capillary: 263 mg/dL — ABNORMAL HIGH (ref 70–99)
Glucose-Capillary: 242 mg/dL — ABNORMAL HIGH (ref 70–99)
Glucose-Capillary: 251 mg/dL — ABNORMAL HIGH (ref 70–99)

## 2024-01-31 MED ORDER — SULFAMETHOXAZOLE-TRIMETHOPRIM 400-80 MG PO TABS
1.0000 | ORAL_TABLET | Freq: Two times a day (BID) | ORAL | 0 refills | Status: AC
  Filled 2024-01-31: qty 14, 7d supply, fill #0

## 2024-01-31 MED ORDER — METOPROLOL SUCCINATE ER 50 MG PO TB24
50.0000 mg | ORAL_TABLET | Freq: Every day | ORAL | 0 refills | Status: DC
  Filled 2024-01-31: qty 30, 30d supply, fill #0

## 2024-01-31 MED ORDER — PREDNISONE 20 MG PO TABS
20.0000 mg | ORAL_TABLET | Freq: Every day | ORAL | 0 refills | Status: AC
  Filled 2024-01-31: qty 3, 3d supply, fill #0

## 2024-01-31 MED ORDER — LOSARTAN POTASSIUM 25 MG PO TABS
25.0000 mg | ORAL_TABLET | Freq: Every day | ORAL | 0 refills | Status: DC
  Filled 2024-01-31: qty 30, 30d supply, fill #0

## 2024-01-31 MED ORDER — DEXTROMETHORPHAN-GUAIFENESIN 20-200 MG/20ML PO LIQD
30.0000 mL | ORAL | 0 refills | Status: DC | PRN
  Filled 2024-01-31: qty 118, 1d supply, fill #0

## 2024-01-31 MED ORDER — OXYCODONE-ACETAMINOPHEN 5-325 MG PO TABS
1.0000 | ORAL_TABLET | Freq: Four times a day (QID) | ORAL | 0 refills | Status: DC | PRN
Start: 2024-01-31 — End: 2024-02-07
  Filled 2024-01-31: qty 15, 4d supply, fill #0

## 2024-01-31 MED ORDER — FUROSEMIDE 10 MG/ML IJ SOLN: 40.0000 mg | Freq: Once | INTRAMUSCULAR | Status: DC

## 2024-01-31 NOTE — Discharge Instructions (Signed)
 Vascular Surgery Discharge Instructions:  Do not lift anything heavy do not lift anything more than a gallon of milk for the next 2 weeks until your staples are removed.  You may shower tomorrow on 02/03/2024.  Let the soap and water  run over your staple line and pat completely dry after showering.  Then paint the staple line with Betadine sticks once a day until your staples are removed.  You are being discharged on aspirin  81 mg daily, Plavix  75 mg daily and Lipitor 10 mg daily.  Please do not miss or skip taking any of these medications as it will alter the outcome of your surgery.  Follow-up with pulmonology as scheduled  Follow-up with vein and vascular as scheduled for staple removal.

## 2024-01-31 NOTE — Progress Notes (Signed)
 [facility]  Daily Progress Note   Subjective  -   Feeling much better.  Not quite at her baseline, but improved from the last couple of days.  No major events overnight.  Objective Vitals:   01/31/24 0455 01/31/24 0723 01/31/24 0733 01/31/24 1136  BP: 123/77  (!) 135/94 (!) 140/78  Pulse: 72  82 76  Resp: 12  17 19   Temp: (!) 97.4 F (36.3 C)   98.2 F (36.8 C)  TempSrc: Oral     SpO2: 98% 98% 94% 92%  Weight:      Height:        Intake/Output Summary (Last 24 hours) at 01/31/2024 1314 Last data filed at 01/31/2024 0900 Gross per 24 hour  Intake 240 ml  Output 600 ml  Net -360 ml    PULM  respirations not labored on 3 L nasal cannula CV  RRR VASC  left foot is warm with PT pulse present.  Laboratory CBC    Component Value Date/Time   WBC 10.9 (H) 01/30/2024 0435   HGB 9.4 (L) 01/30/2024 0435   HGB 12.7 10/08/2023 1004   HGB 11.6 10/04/2023 1047   HCT 30.4 (L) 01/30/2024 0435   HCT 37.6 10/04/2023 1047   PLT 324 01/30/2024 0435   PLT 264 10/08/2023 1004   PLT 236 10/04/2023 1047    BMET    Component Value Date/Time   NA 137 01/30/2024 0435   NA 144 10/04/2023 1047   NA 141 04/17/2014 1055   K 4.6 01/30/2024 0435   K 3.9 04/17/2014 1055   CL 100 01/30/2024 0435   CL 102 04/17/2014 1055   CO2 32 01/30/2024 0435   CO2 30 04/17/2014 1055   GLUCOSE 228 (H) 01/30/2024 0435   GLUCOSE 201 (H) 04/17/2014 1055   BUN 22 01/30/2024 0435   BUN 12 10/04/2023 1047   BUN 10 04/17/2014 1055   CREATININE 0.69 01/30/2024 0435   CREATININE 0.84 07/06/2023 1016   CREATININE 0.90 04/17/2014 1055   CALCIUM  8.5 (L) 01/30/2024 0435   CALCIUM  9.1 04/17/2014 1055   GFRNONAA >60 01/30/2024 0435   GFRNONAA >60 07/06/2023 1016   GFRNONAA >60 04/17/2014 1055   GFRAA >60 05/05/2020 0558   GFRAA >60 04/17/2014 1055    Assessment/Planning: POD #10 s/p left femoral endarterectomy, left iliac stent, and left SFA and tibial intervention  Pulmonary  this has improved over the weekend Would recommend she work with physical therapy today and potentially look for discharge tomorrow Wednesday if she is stable and near her baseline Appreciate multiple consultants input. Will need to continue dual antiplatelet therapy and statin agent for extensive vascular disease.  Some hemoptysis present but workup has been unrevealing thus far.  Again, appreciate input from consultants.   Angela Jensen  01/31/2024, 1:14 PM

## 2024-01-31 NOTE — Progress Notes (Signed)
 [facility]  - [facility] at [facility]   PATIENT NAME: Angela Jensen    MR#:  [id number]  DATE OF BIRTH:  07/14/1956  SUBJECTIVE:  spoke with daughter at bedside. Patient has some hemoptysis mild streaks. Denies any sore throat any fever. Has chronic respiratory failure. No distress at present. Tolerating CPAP at night. VITALS:  Blood pressure (!) 140/78, pulse 76, temperature 98.2 F (36.8 C), resp. rate 19, height 4' 11 (1.499 m), weight 59.9 kg, SpO2 92%.  PHYSICAL EXAMINATION:   GENERAL:  68 y.o.-year-old patient with no acute distress.  LUNGS: distant breath sounds bilaterally, no wheezing CARDIOVASCULAR: S1, S2 normal. No murmur   ABDOMEN: Soft, nontender, nondistended. Bowel sounds present.  EXTREMITIES: No  edema b/l.    NEUROLOGIC: nonfocal  patient is alert and awake   LABORATORY PANEL:  CBC Recent Labs  Lab 01/30/24 0435  WBC 10.9*  HGB 9.4*  HCT 30.4*  PLT 324    Chemistries  Recent Labs  Lab 01/27/24 2205 01/29/24 0532 01/30/24 0435  NA  --    < > 137  K  --    < > 4.6  CL  --    < > 100  CO2  --    < > 32  GLUCOSE  --    < > 228*  BUN  --    < > 22  CREATININE  --    < > 0.69  CALCIUM   --    < > 8.5*  MG 1.8  --   --    < > = values in this interval not displayed.   Cardiac Enzymes No results for input(s): TROPONINI in the last 168 hours. RADIOLOGY:  No results found.   Assessment and Plan Angela Jensen is a 68 y.o. female with past medical history of   SSS s/p micra (06/2021), Lung cancer s/p XRT, hx of recurrent pneumothorax, hypertension,  chronic HFpEF, HTN,  IDDM,, COPD/asthma (baseline 2L O2), OSA,, tobacco use,  PVD s/p left femoral endarterectomy 01/21/24 (prior preop cardiac clearance on 6/5) now receiving  PT/OT without event, being seen in consultation for shortness of breath, resulting in a rapid response on the evening of 01/27/2024.   Acute on chronic respiratory failure with hypoxia and hypercapnia (HCC) -Possible  COPD exacerbation, superimposed on OSA --CTA chest negative for PE, infiltrate or fluid overload-stable from prior CT and chest x-ray --Patient wears 3 L at baseline and CPAP at night --BNP normal and no evidence of interstitial edema to suggest CHF.   --on prednsione 20 mg every day --DuoNeb as needed --tolerating po diet, breathing back to baseline  Hemoptysis--Non massive -- consulted pulmonary Dr. Alana Hoyle. Recommends continue PO prednisone  chronic and empiric antibiotic with Bactrim. -- Hemoglobin stable -- patient on Plavix  for PAD will continue for now --pt would like ot f/u with Dr A  Elevated troponin --Troponin 50-->133-->261, EKG nonacute, patient without chest pain --Recently had cardiac clearance preoperatively on 6/2 --Suspecting demand ischemia from acute respiratory distress --Echocardiogram EG 60-65% no WMA --[facility] consulted--no further w/u needed at present   S/p femoral endarterectomy 01/21/2024 S/p left femoral endarterectomy, left iliac stent, and left SFA, popliteal, and posterior tibial interventions  Atherosclerosis on rest pain/ischemic limb --Management per vascular   SSS (sick sinus syndrome) s/p leadless pacemaker (HCC) --No acute issues suspected   Chronic heart failure with preserved ejection fraction (HFpEF) (HCC) --CTA chest not showing evidence of fluid overload, BNP normal --Clinically euvolemic --Continue home GDMT --  Echo 60-65% EF   Cancer of lower lobe of right lung (HCC) --Patient is s/p XRT --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 IM will sign off. [facility] informed.     TOTAL TIME TAKING CARE OF THIS PATIENT: 35 minutes.  >50% time spent on counselling and coordination of care  Note: This dictation was prepared with Dragon dictation along with smaller phrase technology. Any transcriptional errors that result from this process are unintentional.  Melvinia Stager M.D    [facility]   CC: Primary care physician; Laurence Pons, NP

## 2024-01-31 NOTE — Progress Notes (Signed)
   01/31/2024  [name], female   DOB: 1956-08-05, 68 y.o.   MRN: [id number]  Attempted to contact patient for medication management/review. Left HIPAA compliant message for patient to return my call at their convenience. Per chart review, patient's hemoglobin A1c improved and will discuss at next encounter methods to continue to improve.   Will follow up with patient in 7-14 business days.  Thank you for allowing pharmacy to be a part of this patient's care.  Alexandria Angel, PharmD Clinical Pharmacist Cell: [phone]

## 2024-01-31 NOTE — Consult Note (Signed)
 PULMONOLOGY         Date: 01/31/2024,   MRN# [id number] Angela Jensen 08-30-1955     AdmissionWeight: 54 kg                 CurrentWeight: 59.9 kg  Referring provider: Dr Melvinia Stager   CHIEF COMPLAINT:   Non-massive hemoptysis   HISTORY OF PRESENT ILLNESS   68 year old lady with a history of asthma and COPD overlap coronary artery disease on antiplatelet agents recent onset of nonmassive hemoptysis and acute on chronic hypoxemia.  Also has a background history of dyslipidemia chronic hypoxemia requiring 2 L supplemental O2 at night peripheral vascular disease sleep apnea history of lung cancer on the right status post full scope of treatment came in with COPD with acute on chronic exacerbation with hypoxemia as well as non massive hemoptysis consultation for pulmonary placed due to ongoing NON massive hemoptysis with hypoxemia.   PAST MEDICAL HISTORY   Past Medical History:  Diagnosis Date   Anemia    Arthritis    Asthma    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/2016   Cancer (HCC) 2012   Right Lung CA   COPD (chronic obstructive pulmonary disease) (HCC)    Depression    Diabetes mellitus without complication (HCC)    Patient takes Janumet   Essential hypertension 05/26/2016   Heart failure (HCC) 2022   Hydropneumothorax 05/03/2020   Hypercholesteremia    Oxygen  dependent    2L at nite    PAD (peripheral artery disease) (HCC) 06/22/2016   Peripheral vascular disease (HCC)    Personal history of radiation therapy    Shortness of breath dyspnea    with exertion    Sialolithiasis    Sleep apnea    Wears dentures    full upper and lower     SURGICAL HISTORY   Past Surgical History:  Procedure Laterality Date   APPLICATION OF CELL SAVER Left 01/21/2024   Procedure: APPLICATION OF CELL SAVER;  Surgeon: Celso College, MD;  Location: [facility];  Service: Vascular;  Laterality: Left;   CESAREAN SECTION      x3   COLONOSCOPY WITH PROPOFOL  N/A 06/25/2015   Procedure: COLONOSCOPY WITH PROPOFOL ;  Surgeon: Marnee Sink, MD;  Location: [facility];  Service: Endoscopy;  Laterality: N/A;   COLONOSCOPY WITH PROPOFOL  N/A 07/26/2020   Procedure: COLONOSCOPY WITH PROPOFOL ;  Surgeon: Marnee Sink, MD;  Location: [facility];  Service: Endoscopy;  Laterality: N/A;   CYST REMOVAL LEG     and on shoulder    ENDARTERECTOMY FEMORAL Left 01/21/2024   Procedure: LEFT COMMON, SUPERFICIAL FEMORAL AND PROFUNDA FEMORIS ENDARTERECTOMY WITH BOVINE PERICARDIAL PATCH ANGIOPLASTY;  Surgeon: Celso College, MD;  Location: [facility];  Service: Vascular;  Laterality: Left;   ESOPHAGOGASTRODUODENOSCOPY (EGD) WITH PROPOFOL  N/A 07/26/2020   Procedure: ESOPHAGOGASTRODUODENOSCOPY (EGD) WITH PROPOFOL ;  Surgeon: Marnee Sink, MD;  Location: [facility];  Service: Endoscopy;  Laterality: N/A;  Diabetic - oral meds   INSERTION OF ILIAC STENT Left 01/21/2024   Procedure: INSERTION, STENT, ARTERY, ILIAC;  Surgeon: Celso College, MD;  Location: [facility];  Service: Vascular;  Laterality: Left;  AND SFA STENTS   LOWER EXTREMITY ANGIOGRAPHY Left 09/29/2018   Procedure: LOWER EXTREMITY ANGIOGRAPHY;  Surgeon: Celso College, MD;  Location: [facility];  Service: Cardiovascular;  Laterality: Left;   LOWER EXTREMITY ANGIOGRAPHY Left 07/29/2023   Procedure: Lower Extremity Angiography;  Surgeon:  Celso College, MD;  Location: [facility];  Service: Cardiovascular;  Laterality: Left;   LOWER EXTREMITY ANGIOGRAPHY Right 12/20/2023   Procedure: Lower Extremity Angiography;  Surgeon: Celso College, MD;  Location: [facility];  Service: Cardiovascular;  Laterality: Right;   LOWER EXTREMITY ANGIOGRAPHY Left 01/17/2024   Procedure: Lower Extremity Angiography;  Surgeon: Celso College, MD;  Location: [facility];  Service: Cardiovascular;  Laterality: Left;   LUNG BIOPSY  12/30/2011   has lung spots    PACEMAKER IMPLANT  07/14/2021   PACEMAKER LEADLESS INSERTION N/A 07/14/2021   Procedure: PACEMAKER LEADLESS INSERTION;  Surgeon: Percival Brace, MD;  Location: [facility];  Service: Cardiovascular;  Laterality: N/A;   PERIPHERAL VASCULAR CATHETERIZATION Left 06/01/2016   Procedure: Lower Extremity Angiography;  Surgeon: Celso College, MD;  Location: [facility];  Service: Cardiovascular;  Laterality: Left;   PERIPHERAL VASCULAR CATHETERIZATION N/A 06/01/2016   Procedure: Abdominal Aortogram w/Lower Extremity;  Surgeon: Celso College, MD;  Location: [facility];  Service: Cardiovascular;  Laterality: N/A;   PERIPHERAL VASCULAR CATHETERIZATION  06/01/2016   Procedure: Lower Extremity Intervention;  Surgeon: Celso College, MD;  Location: [facility];  Service: Cardiovascular;;   PERIPHERAL VASCULAR CATHETERIZATION Right 06/08/2016   Procedure: Lower Extremity Angiography;  Surgeon: Celso College, MD;  Location: [facility];  Service: Cardiovascular;  Laterality: Right;   PERIPHERAL VASCULAR CATHETERIZATION  06/08/2016   Procedure: Lower Extremity Intervention;  Surgeon: Celso College, MD;  Location: [facility];  Service: Cardiovascular;;   SUBMANDIBULAR GLAND EXCISION Left 12/06/2020   Procedure: EXCISION SUBMANDIBULAR GLAND;  Surgeon: Lesly Raspberry, MD;  Location: [facility];  Service: ENT;  Laterality: Left;  needs to be first case Diabetic - diet controlled   TEMPORARY PACEMAKER N/A 07/11/2021   Procedure: TEMPORARY PACEMAKER;  Surgeon: Percival Brace, MD;  Location: [facility];  Service: Cardiovascular;  Laterality: N/A;     FAMILY HISTORY   Family History  Problem Relation Age of Onset   Diabetes Mother    Hypercholesterolemia Mother    Lung cancer Father    Diabetes Sister    Diabetes Sister    Hypertension Sister    Diabetes Maternal Grandmother    Diabetes Paternal Grandmother    Heart attack Brother     Coronary artery disease Brother    Vascular Disease Brother    Heart attack Brother    Breast cancer Neg Hx      SOCIAL HISTORY   Social History   Tobacco Use   Smoking status: Former    Current packs/day: 0.00    Average packs/day: 1 pack/day for 37.0 years (37.0 ttl pk-yrs)    Types: Cigarettes    Start date: 02/06/1973    Quit date: 02/06/2010    Years since quitting: 13.9   Smokeless tobacco: Former    Types: Snuff  Vaping Use   Vaping status: Never Used  Substance Use Topics   Alcohol use: Not Currently    Alcohol/week: 5.0 standard drinks of alcohol    Types: 5 Cans of beer per week    Comment: /h x of alcohol abuse -stopped 2012- now drinks 5 beer per week   Drug use: Not Currently    Types: Marijuana, Crack cocaine, Cocaine    Comment: hx of cocaine use- last use 2015; last use marijuana6/22/19,     MEDICATIONS    Home Medication:  Current Outpatient Rx   Order #: [id number] Class: Normal   [START ON 02/01/2024] Order #: [id number] Class: Normal   Order #: [id number] Class: Normal   [START ON 02/01/2024] Order #: [id number] Class: Normal   Order #: [id number] Class: Normal   [START ON 02/01/2024] Order #: [id number] Class: Normal   Order #: [id number] Class: Normal    Current Medication:  Current Facility-Administered Medications:    acetaminophen  (TYLENOL ) tablet 325-650 mg, 325-650 mg, Oral, Q4H PRN **OR** acetaminophen  (TYLENOL ) suppository 325-650 mg, 325-650 mg, Rectal, Q4H PRN, Celso College, MD   albuterol  (PROVENTIL ) (2.5 MG/3ML) 0.083% nebulizer solution 2.5 mg, 2.5 mg, Nebulization, Q6H, Dew, Jason S, MD, 2.5 mg at 01/31/24 1311   ALPRAZolam  (XANAX ) tablet 0.5 mg, 0.5 mg, Oral, BID PRN, Dew, Jason S, MD, 0.5 mg at 01/29/24 1627   alum & mag hydroxide-simeth (MAALOX/MYLANTA) 200-200-20 MG/5ML suspension 15-30 mL, 15-30 mL, Oral, Q2H PRN, Vonna Guardian, Donald Frost, MD   aspirin  EC tablet 81 mg, 81 mg, Oral, Daily, Dew, Jason S, MD, 81 mg at 01/31/24 7425   atorvastatin   (LIPITOR) tablet 10 mg, 10 mg, Oral, Daily, Dew, Jason S, MD, 10 mg at 01/31/24 0813   calcium  carbonate (OS-CAL - dosed in mg of elemental calcium ) tablet 1,250 mg, 500 mg of elemental calcium , Oral, BID WC, Dew, Jason S, MD, 1,250 mg at 01/31/24 0817   cholecalciferol  (VITAMIN D3) 25 MCG (1000 UNIT) tablet 1,000 Units, 1,000 Units, Oral, q AM, Celso College, MD, 1,000 Units at 01/31/24 0607   clopidogrel  (PLAVIX ) tablet 75 mg, 75 mg, Oral, Daily, Pace, Brien R, NP, 75 mg at 01/31/24 9563   cyclobenzaprine  (FLEXERIL ) tablet 10 mg, 10 mg, Oral, QHS PRN, Dew, Jason S, MD, 10 mg at 01/30/24 2025   diphenhydrAMINE  (BENADRYL ) capsule 25 mg, 25 mg, Oral, Q6H PRN, Dew, Jason S, MD, 25 mg at 01/23/24 2201   escitalopram  (LEXAPRO ) tablet 10 mg, 10 mg, Oral, Daily, Dew, Donald Frost, MD, 10 mg at 01/31/24 8756   famotidine  (PEPCID ) tablet 20 mg, 20 mg, Oral, Daily PRN, Celso College, MD   ferrous sulfate  tablet 325 mg, 325 mg, Oral, BID WC, Dew, Jason S, MD, 325 mg at 01/31/24 0813   fluticasone  furoate-vilanterol (BREO ELLIPTA ) 100-25 MCG/ACT 1 puff, 1 puff, Inhalation, Daily, Dew, Jason S, MD, 1 puff at 01/31/24 4332   gabapentin  (NEURONTIN ) capsule 300 mg, 300 mg, Oral, TID, Dew, Jason S, MD, 300 mg at 01/31/24 9518   guaiFENesin -dextromethorphan (ROBITUSSIN DM) 100-10 MG/5ML syrup 15 mL, 15 mL, Oral, Q4H PRN, Dew, Donald Frost, MD   hydrALAZINE  (APRESOLINE ) injection 5 mg, 5 mg, Intravenous, Q20 Min PRN, Dew, Donald Frost, MD   insulin  aspart (novoLOG ) injection 0-20 Units, 0-20 Units, Subcutaneous, TID AC & HS, Elisabeth Guild, NP, 11 Units at 01/31/24 1209   labetalol  (NORMODYNE ) injection 10 mg, 10 mg, Intravenous, Q10 min PRN, Celso College, MD, 10 mg at 01/30/24 1309   losartan (COZAAR) tablet 25 mg, 25 mg, Oral, Daily, Decoste, Gabriella, PA-C, 25 mg at 01/31/24 8416   magnesium  hydroxide (MILK OF MAGNESIA) suspension 30 mL, 30 mL, Oral, Daily PRN, Celso College, MD, 30 mL at 01/25/24 1131   metoprolol  succinate  (TOPROL -XL) 24 hr tablet 50 mg, 50 mg, Oral, Daily, Decoste, Gabriella, PA-C, 50 mg at 01/31/24 6063   metoprolol  tartrate (LOPRESSOR ) injection 2-5 mg, 2-5 mg, Intravenous, Q2H PRN, Dew, Donald Frost, MD   ondansetron  (ZOFRAN ) injection 4 mg, 4 mg, Intravenous, Q6H PRN, Vonna Guardian, Donald Frost, MD, 4 mg  at 01/18/24 1447   oxyCODONE -acetaminophen  (PERCOCET/ROXICET) 5-325 MG per tablet 1-2 tablet, 1-2 tablet, Oral, Q4H PRN, Celso College, MD, 2 tablet at 01/31/24 1031   pantoprazole  (PROTONIX ) EC tablet 40 mg, 40 mg, Oral, Daily, Dew, Donald Frost, MD, 40 mg at 01/31/24 0812   phenol (CHLORASEPTIC) mouth spray 1 spray, 1 spray, Mouth/Throat, PRN, Vonna Guardian, Donald Frost, MD   predniSONE  (DELTASONE ) tablet 20 mg, 20 mg, Oral, Q breakfast, Patel, Sona, MD, 20 mg at 01/31/24 0813   sodium phosphate  (FLEET) enema 1 enema, 1 enema, Rectal, Daily PRN, Vonna Guardian, Donald Frost, MD   sorbitol  70 % solution 30 mL, 30 mL, Oral, Daily PRN, Vonna Guardian, Donald Frost, MD   sulfamethoxazole-trimethoprim (BACTRIM) 400-80 MG per tablet 1 tablet, 1 tablet, Oral, Q12H, Taylee Gunnells, MD, 1 tablet at 01/31/24 0817   umeclidinium bromide  (INCRUSE ELLIPTA ) 62.5 MCG/ACT 1 puff, 1 puff, Inhalation, Daily, Dew, Donald Frost, MD, 1 puff at 01/30/24 0859   vitamin B-12 (CYANOCOBALAMIN ) tablet 100 mcg, 100 mcg, Oral, Daily, Dew, Donald Frost, MD, 100 mcg at 01/31/24 1610    ALLERGIES   Iodinated contrast media, Trelegy ellipta  [fluticasone -umeclidin-vilant], and Bactoshield chg [chlorhexidine  gluconate]     REVIEW OF SYSTEMS    Review of Systems:  Gen:  Denies  fever, sweats, chills weigh loss  HEENT: Denies blurred vision, double vision, ear pain, eye pain, hearing loss, nose bleeds, sore throat Cardiac:  No dizziness, chest pain or heaviness, chest tightness,edema Resp:   reports dyspnea chronically  Gi: Denies swallowing difficulty, stomach pain, nausea or vomiting, diarrhea, constipation, bowel incontinence Gu:  Denies bladder incontinence, burning urine Ext:   Denies  Joint pain, stiffness or swelling Skin: Denies  skin rash, easy bruising or bleeding or hives Endoc:  Denies polyuria, polydipsia , polyphagia or weight change Psych:   Denies depression, insomnia or hallucinations   Other:  All other systems negative   VS: BP (!) 140/78 (BP Location: Right Arm)   Pulse 76   Temp 98.2 F (36.8 C)   Resp 19   Ht 4' 11 (1.499 m)   Wt 59.9 kg   SpO2 92%   BMI 26.67 kg/m      PHYSICAL EXAM    GENERAL:NAD, no fevers, chills, no weakness no fatigue HEAD: Normocephalic, atraumatic.  EYES: Pupils equal, round, reactive to light. Extraocular muscles intact. No scleral icterus.  MOUTH: Moist mucosal membrane. Dentition intact. No abscess noted.  EAR, NOSE, THROAT: Clear without exudates. No external lesions.  NECK: Supple. No thyromegaly. No nodules. No JVD.  PULMONARY: decreased breath sounds with mild rhonchi worse at bases bilaterally.  CARDIOVASCULAR: S1 and S2. Regular rate and rhythm. No murmurs, rubs, or gallops. No edema. Pedal pulses 2+ bilaterally.  GASTROINTESTINAL: Soft, nontender, nondistended. No masses. Positive bowel sounds. No hepatosplenomegaly.  MUSCULOSKELETAL: No swelling, clubbing, or edema. Range of motion full in all extremities.  NEUROLOGIC: Cranial nerves II through XII are intact. No gross focal neurological deficits. Sensation intact. Reflexes intact.  SKIN: No ulceration, lesions, rashes, or cyanosis. Skin warm and dry. Turgor intact.  PSYCHIATRIC: Mood, affect within normal limits. The patient is awake, alert and oriented x 3. Insight, judgment intact.       IMAGING     ASSESSMENT/PLAN   Non massive hemoptysis     - patient is on antiplatelet agent but has blood streaked cough only.  Will treat with nebulized tXA x one dose for now    - there is a right lower lobe infiltrate ,  I think she would benefit from empiric treatment with bactrim PO BID to cover for cAP and MRSA    - continue incentive spirometry     COPD with centrilobular emphysema    - continue albuterol  and incruse ellipta     - continue prednisone  20mg       Right lower lobe atelectasis    - continue incentive spirometry    Thank you for allowing me to participate in the care of this patient.   Patient/Family are satisfied with care plan and all questions have been answered.    Provider disclosure: Patient with at least one acute or chronic illness or injury that poses a threat to life or bodily function and is being managed actively during this encounter.  All of the below services have been performed independently by signing provider:  review of prior documentation from internal and or external health records.  Review of previous and current lab results.  Interview and comprehensive assessment during patient visit today. Review of current and previous chest radiographs/CT scans. Discussion of management and test interpretation with health care team and patient/family.   This document was prepared using Dragon voice recognition software and may include unintentional dictation errors.     Moesha Sarchet, M.D.  Division of Pulmonary & Critical Care Medicine

## 2024-01-31 NOTE — Progress Notes (Signed)
 [facility] CLINIC CARDIOLOGY PROGRESS NOTE       [name] MRN: [id number] DOB/AGE: [birth year] 68 y.o.  Admit date: 01/17/2024 Referring Physician Dr. Mikki Alexander Primary Physician Laurence Pons, NP Primary Cardiologist Dr. Beau Bound Reason for Consultation POC  HPI: Angela Jensen is a 68 y.o. female  with a past medical history of chronic HFpEF, SSS s/p micra (06/2021), hypertension, hyperlipidemia, aortic atherosclerosis, IDDM, PVD, COPD/asthma (baseline 2L O2), OSA, tobacco use who presented for outpatient procedure on 01/17/2024 underwent lower extremity angiogram on 06/02 and found that patient will need a left femoral endarterectomy with illiac stents and SFA intervention. Cardiology was consulted for preoperative cardiac evaluation (06/02), consulted for elevated troponins (06/13).  Interval History: -POD 10 s/p left femoral endarterectomy, iliac stent, SFA and tibial intervention . -Patient seen and examined this AM. Patient states her SOB is improving but not at baseline. Patient reports breathing treatments have helped a lot. -Patient continues to deny any chest pain/pressure or palpitations.  -Patients BP elevated and HR stable.   -Patient on 3L with stable SpO2. Patient on continues 3L [place] at home.  Review of systems complete and found to be negative unless listed above    Past Medical History:  Diagnosis Date   Anemia    Arthritis    Asthma    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/2016   Cancer (HCC) 2012   Right Lung CA   COPD (chronic obstructive pulmonary disease) (HCC)    Depression    Diabetes mellitus without complication (HCC)    Patient takes Janumet   Essential hypertension 05/26/2016   Heart failure (HCC) 2022   Hydropneumothorax 05/03/2020   Hypercholesteremia    Oxygen  dependent    2L at nite    PAD (peripheral artery disease) (HCC) 06/22/2016   Peripheral vascular disease (HCC)     Personal history of radiation therapy    Shortness of breath dyspnea    with exertion    Sialolithiasis    Sleep apnea    Wears dentures    full upper and lower    Past Surgical History:  Procedure Laterality Date   APPLICATION OF CELL SAVER Left 01/21/2024   Procedure: APPLICATION OF CELL SAVER;  Surgeon: Celso College, MD;  Location: [facility] ORS;  Service: Vascular;  Laterality: Left;   CESAREAN SECTION     x3   COLONOSCOPY WITH PROPOFOL  N/A 06/25/2015   Procedure: COLONOSCOPY WITH PROPOFOL ;  Surgeon: Marnee Sink, MD;  Location: [facility] ENDOSCOPY;  Service: Endoscopy;  Laterality: N/A;   COLONOSCOPY WITH PROPOFOL  N/A 07/26/2020   Procedure: COLONOSCOPY WITH PROPOFOL ;  Surgeon: Marnee Sink, MD;  Location: [facility];  Service: Endoscopy;  Laterality: N/A;   CYST REMOVAL LEG     and on shoulder    ENDARTERECTOMY FEMORAL Left 01/21/2024   Procedure: LEFT COMMON, SUPERFICIAL FEMORAL AND PROFUNDA FEMORIS ENDARTERECTOMY WITH BOVINE PERICARDIAL PATCH ANGIOPLASTY;  Surgeon: Celso College, MD;  Location: [facility] ORS;  Service: Vascular;  Laterality: Left;   ESOPHAGOGASTRODUODENOSCOPY (EGD) WITH PROPOFOL  N/A 07/26/2020   Procedure: ESOPHAGOGASTRODUODENOSCOPY (EGD) WITH PROPOFOL ;  Surgeon: Marnee Sink, MD;  Location: [facility];  Service: Endoscopy;  Laterality: N/A;  Diabetic - oral meds   INSERTION OF ILIAC STENT Left 01/21/2024   Procedure: INSERTION, STENT, ARTERY, ILIAC;  Surgeon: Celso College, MD;  Location: [facility] OR[facility];  Service: Vascular;  Laterality: Left;  AND SFA STENTS   LOWER EXTREMITY ANGIOGRAPHY Left 09/29/2018  Procedure: LOWER EXTREMITY ANGIOGRAPHY;  Surgeon: Celso College, MD;  Location: [facility] INVASIVE CV LAB;  Service: Cardiovascular;  Laterality: Left;   LOWER EXTREMITY ANGIOGRAPHY Left 07/29/2023   Procedure: Lower Extremity Angiography;  Surgeon: Celso College, MD;  Location: [facility];  Service: Cardiovascular;  Laterality: Left;   LOWER EXTREMITY  ANGIOGRAPHY Right 12/20/2023   Procedure: Lower Extremity Angiography;  Surgeon: Celso College, MD;  Location: [facility];  Service: Cardiovascular;  Laterality: Right;   LOWER EXTREMITY ANGIOGRAPHY Left 01/17/2024   Procedure: Lower Extremity Angiography;  Surgeon: Celso College, MD;  Location: [facility];  Service: Cardiovascular;  Laterality: Left;   LUNG BIOPSY  12/30/2011   has lung spots   PACEMAKER IMPLANT  07/14/2021   PACEMAKER LEADLESS INSERTION N/A 07/14/2021   Procedure: PACEMAKER LEADLESS INSERTION;  Surgeon: Percival Brace, MD;  Location: [facility];  Service: Cardiovascular;  Laterality: N/A;   PERIPHERAL VASCULAR CATHETERIZATION Left 06/01/2016   Procedure: Lower Extremity Angiography;  Surgeon: Celso College, MD;  Location: [facility];  Service: Cardiovascular;  Laterality: Left;   PERIPHERAL VASCULAR CATHETERIZATION N/A 06/01/2016   Procedure: Abdominal Aortogram w/Lower Extremity;  Surgeon: Celso College, MD;  Location: [facility];  Service: Cardiovascular;  Laterality: N/A;   PERIPHERAL VASCULAR CATHETERIZATION  06/01/2016   Procedure: Lower Extremity Intervention;  Surgeon: Celso College, MD;  Location: [facility];  Service: Cardiovascular;;   PERIPHERAL VASCULAR CATHETERIZATION Right 06/08/2016   Procedure: Lower Extremity Angiography;  Surgeon: Celso College, MD;  Location: [facility];  Service: Cardiovascular;  Laterality: Right;   PERIPHERAL VASCULAR CATHETERIZATION  06/08/2016   Procedure: Lower Extremity Intervention;  Surgeon: Celso College, MD;  Location: [facility];  Service: Cardiovascular;;   SUBMANDIBULAR GLAND EXCISION Left 12/06/2020   Procedure: EXCISION SUBMANDIBULAR GLAND;  Surgeon: Lesly Raspberry, MD;  Location: [facility];  Service: ENT;  Laterality: Left;  needs to be first case Diabetic - diet controlled   TEMPORARY PACEMAKER N/A 07/11/2021   Procedure: TEMPORARY  PACEMAKER;  Surgeon: Percival Brace, MD;  Location: [facility] INV[facility];  Service: Cardiovascular;  Laterality: N/A;    Medications Prior to Admission  Medication Sig Dispense Refill Last Dose/Taking   albuterol  (VENTOLIN  HFA) 108 (90 Base) MCG/ACT inhaler INHALE 2 PUFFS BY MOUTH EVERY 6 HOURS AS NEEDED FOR WHEEZING OR SHORTNESS OF BREATH 3 each 5 01/17/2024 Morning   ALPRAZolam  (XANAX ) 0.5 MG tablet Take 1 tablet (0.5 mg total) by mouth 2 (two) times daily as needed for anxiety. 60 tablet 2 01/17/2024 Morning   aspirin  EC 81 MG tablet Take 1 tablet (81 mg total) by mouth daily. Swallow whole. 150 tablet 2 01/17/2024 Morning   atorvastatin  (LIPITOR) 10 MG tablet Take 1 tablet (10 mg total) by mouth daily. 30 tablet 11 01/17/2024 Morning   calcium  carbonate (OSCAL) 1500 (600 Ca) MG TABS tablet Take 600 mg of elemental calcium  by mouth 2 (two) times daily with a meal.   01/17/2024 Morning   clopidogrel  (PLAVIX ) 75 MG tablet Take 1 tablet (75 mg total) by mouth daily. 90 tablet 3 01/17/2024 Morning   cyclobenzaprine  (FLEXERIL ) 10 MG tablet Take 1 tablet (10 mg total) by mouth at bedtime. Take one tab po qhs for back spasm prn only 30 tablet 3 Past Month   escitalopram  (LEXAPRO ) 10 MG tablet Take 1 tablet (10 mg total) by mouth daily. 90 tablet 1  01/17/2024 Morning   ferrous sulfate  325 (65 FE) MG tablet Take 325 mg by mouth 2 (two) times daily with a meal.   01/17/2024 Morning   furosemide  (LASIX ) 20 MG tablet Take 1 tablet (20 mg total) by mouth daily as needed. 30 tablet 5 01/16/2024   gabapentin  (NEURONTIN ) 300 MG capsule TAKE 1 CAPSULE BY MOUTH THREE TIMES DAILY 90 capsule 0 01/17/2024 Morning   insulin  glargine, 1 Unit Dial , (TOUJEO ) 300 UNIT/ML Solostar Pen Inject 10 Units into the skin daily. 1.5 mL 11 01/17/2024 Morning   ipratropium-albuterol  (DUONEB) 0.5-2.5 (3) MG/3ML SOLN USE 1 AMPULE IN NEBULIZER EVERY 4 HOURS AS NEEDED 360 mL 0 01/17/2024 Morning   lisinopril -hydrochlorothiazide  (ZESTORETIC ) 20-25 MG  tablet Take 1 tablet by mouth daily. 90 tablet 3 01/17/2024 Morning   metoprolol  succinate (TOPROL -XL) 25 MG 24 hr tablet Take 1 tablet (25 mg total) by mouth daily. 90 tablet 3 01/17/2024 Morning   mometasone -formoterol  (DULERA ) 200-5 MCG/ACT AERO Inhale 2 puffs by mouth twice daily 13 g 12 01/17/2024 Morning   potassium chloride  (KLOR-CON ) 10 MEQ tablet TAKE 1 TABLET BY MOUTH EVERY OTHER DAY 45 tablet 0 01/16/2024   SPIRIVA  HANDIHALER 18 MCG inhalation capsule INHALE THE CONTENTS OF 1 CAPSULES BY MOUTH ONCE DAILY - DO NOT SWALLOW CAPSULES 90 capsule 3 01/17/2024 Morning   vitamin B-12 (CYANOCOBALAMIN ) 100 MCG tablet Take 100 mcg by mouth daily.   01/17/2024 Morning   VITAMIN D , CHOLECALCIFEROL , PO Take 1 tablet by mouth in the morning.   01/17/2024 Morning   ACCU-CHEK GUIDE TEST test strip USE TO TEST BLOOD SUGAR 5 TIMES DAILY 450 each 0    Accu-Chek Softclix Lancets lancets Use 1 lancet 3 times daily to check glucose for diabetes 300 each 1    Blood Glucose Monitoring Suppl (ACCU-CHEK GUIDE) w/Device KIT Use as directed Dx e11.65 1 kit 0    Continuous Blood Gluc Receiver (DEXCOM G7 RECEIVER) DEVI Use one as directed for uncontrolled dm 1 each 0    Continuous Glucose Sensor (DEXCOM G7 SENSOR) MISC USE AS DIRECTED. CHANGE EVERY 10 DAYS. 3 each 0    Ensifentrine  (OHTUVAYRE ) 3 MG/2.5ML SUSP Inhale 3 mg into the lungs in the morning and at bedtime. (Patient not taking: Reported on 01/17/2024) 150 mL 11 Not Taking   famotidine  (PEPCID ) 20 MG tablet Take 1 tablet (20 mg total) by mouth daily for 14 days. (Patient taking differently: Take 20 mg by mouth daily as needed for heartburn.) 14 tablet 0    Insulin  Pen Needle (RELION PEN NEEDLES) 31G X 6 MM MISC USE 1 PEN NEEDLE WITH INSULIN  PEN ONCE DAILY FOR DIABETES 50 each 3    metFORMIN  (GLUCOPHAGE -XR) 500 MG 24 hr tablet TAKE 2 TABLETS BY MOUTH ONCE DAILY WITH BREAKFAST 180 tablet 0    methylPREDNISolone  (MEDROL  DOSEPAK) 4 MG TBPK tablet       OXYGEN  Inhale 4 L into the  lungs. PT USES [facility] FOR OXYGEN       Social History   Socioeconomic History   Marital status: Widowed    Spouse name: Not on file   Number of children: Not on file   Years of education: Not on file   Highest education level: Not on file  Occupational History   Not on file  Tobacco Use   Smoking status: Former    Current packs/day: 0.00    Average packs/day: 1 pack/day for 37.0 years (37.0 ttl pk-yrs)    Types: Cigarettes    Start date:  02/06/1973    Quit date: 02/06/2010    Years since quitting: 13.9   Smokeless tobacco: Former    Types: Snuff  Vaping Use   Vaping status: Never Used  Substance and Sexual Activity   Alcohol use: Not Currently    Alcohol/week: 5.0 standard drinks of alcohol    Types: 5 Cans of beer per week    Comment: /h x of alcohol abuse -stopped 2012- now drinks 5 beer per week   Drug use: Not Currently    Types: Marijuana, Crack cocaine, Cocaine    Comment: hx of cocaine use- last use 2015; last use marijuana6/22/19,   Sexual activity: Yes  Other Topics Concern   Not on file  Social History Narrative   Lives with Significant Other x 43 years   Social Drivers of Corporate investment banker Strain: Not on file  Food Insecurity: No Food Insecurity (01/17/2024)   Hunger Vital Sign    Worried About Running Out of Food in the Last Year: Never true    Ran Out of Food in the Last Year: Never true  Transportation Needs: No Transportation Needs (01/17/2024)   PRAPARE - Administrator, Civil Service (Medical): No    Lack of Transportation (Non-Medical): No  Physical Activity: Not on file  Stress: Not on file  Social Connections: Socially Integrated (01/17/2024)   Social Connection and Isolation Panel    Frequency of Communication with Friends and Family: More than three times a week    Frequency of Social Gatherings with Friends and Family: More than three times a week    Attends Religious Services: 1 to 4 times per year    Active Member of  Clubs or Organizations: No    Attends Banker Meetings: 1 to 4 times per year    Marital Status: Married  Catering manager Violence: Not At Risk (01/17/2024)   Humiliation, Afraid, Rape, and Kick questionnaire    Fear of Current or Ex-Partner: No    Emotionally Abused: No    Physically Abused: No    Sexually Abused: No    Family History  Problem Relation Age of Onset   Diabetes Mother    Hypercholesterolemia Mother    Lung cancer Father    Diabetes Sister    Diabetes Sister    Hypertension Sister    Diabetes Maternal Grandmother    Diabetes Paternal Grandmother    Heart attack Brother    Coronary artery disease Brother    Vascular Disease Brother    Heart attack Brother    Breast cancer Neg Hx      Vitals:   01/31/24 0050 01/31/24 0455 01/31/24 0723 01/31/24 0733  BP: 105/75 123/77  (!) 135/94  Pulse: 64 72  82  Resp: 16 12  17   Temp: (!) 97.5 F (36.4 C) (!) 97.4 F (36.3 C)    TempSrc: Axillary Oral    SpO2: 98% 98% 98% 94%  Weight:      Height:        PHYSICAL EXAM General: chronically ill appearing elderly female, well nourished, in no acute distress. HEENT: Normocephalic and atraumatic. Neck: No JVD.   Lungs: Normal respiratory effort on 3L. Diminished breath sounds bilaterally. Heart: HRRR. Normal S1 and S2 without gallops or murmurs.  Abdomen: Non-distended appearing.  Msk: Normal strength and tone for age. Extremities: Warm and well perfused. No clubbing, cyanosis. No edema.  Neuro: Alert and oriented X 3. Psych: Answers questions appropriately.   Labs:  Basic Metabolic Panel: Recent Labs    01/29/24 0532 01/30/24 0435  NA 137 137  K 3.4* 4.6  CL 96* 100  CO2 34* 32  GLUCOSE 275* 228*  BUN 19 22  CREATININE 0.68 0.69  CALCIUM  8.5* 8.5*   Liver Function Tests: No results for input(s): AST, ALT, ALKPHOS, BILITOT, PROT, ALBUMIN in the last 72 hours.  No results for input(s): LIPASE, AMYLASE in the last 72  hours. CBC: Recent Labs    01/29/24 0532 01/30/24 0435  WBC 12.0* 10.9*  HGB 9.0* 9.4*  HCT 28.7* 30.4*  MCV 91.7 94.1  PLT 300 324   Cardiac Enzymes: Recent Labs    01/28/24 1146  TROPONINIHS 226*   BNP: No results for input(s): BNP in the last 72 hours.  D-Dimer: No results for input(s): DDIMER in the last 72 hours.  Hemoglobin A1C: No results for input(s): HGBA1C in the last 72 hours. Fasting Lipid Panel: No results for input(s): CHOL, HDL, LDLCALC, TRIG, CHOLHDL, LDLDIRECT in the last 72 hours. Thyroid  Function Tests: No results for input(s): TSH, T4TOTAL, T3FREE, THYROIDAB in the last 72 hours.  Invalid input(s): FREET3 Anemia Panel: No results for input(s): VITAMINB12, FOLATE, FERRITIN, TIBC, IRON , RETICCTPCT in the last 72 hours.   Radiology: ECHOCARDIOGRAM COMPLETE Result Date: 01/29/2024    ECHOCARDIOGRAM REPORT   Patient Name:   Angela Jensen [facility] Date of Exam: 01/29/2024 Medical Rec #:  [id number]    Height:       59.0 in Accession #:    [id number]   Weight:       132.1 lb Date of Birth:  08-Jun-1956    BSA:          1.546 m Patient Age:    68 years     BP:           118/77 mmHg Patient Gender: F            HR:           86 bpm. Exam Location:  [facility] Procedure: 2D Echo (Both Spectral and Color Flow Doppler were utilized during            procedure). Indications:     Elevated Troponin  History:         Patient has prior history of Echocardiogram examinations, most                  recent 06/15/2023.  Sonographer:     Clenton Czech RDCS, FASE Referring Phys:  [id number] Lanetta Pion Diagnosing Phys: Antonette Batters MD  Sonographer Comments: Technically challenging study due to limited acoustic windows and no apical window. Image acquisition challenging due to respiratory motion. IMPRESSIONS  1. Left ventricular ejection fraction, by estimation, is 60 to 65%. The left ventricle has normal function. The left ventricle has no regional wall  motion abnormalities. Left ventricular diastolic parameters are consistent with Grade I diastolic dysfunction (impaired relaxation).  2. Right ventricular systolic function is normal. The right ventricular size is normal.  3. The mitral valve is normal in structure. Trivial mitral valve regurgitation.  4. The aortic valve is normal in structure. Aortic valve regurgitation is not visualized. FINDINGS  Left Ventricle: Left ventricular ejection fraction, by estimation, is 60 to 65%. The left ventricle has normal function. The left ventricle has no regional wall motion abnormalities. Strain was performed and the global longitudinal strain is indeterminate. Global longitudinal strain performed but not reported based on interpreter judgement due to suboptimal tracking.  The left ventricular internal cavity size was normal in size. There is no left ventricular hypertrophy. Left ventricular diastolic parameters are consistent with Grade I diastolic dysfunction (impaired relaxation). Right Ventricle: The right ventricular size is normal. No increase in right ventricular wall thickness. Right ventricular systolic function is normal. Left Atrium: Left atrial size was normal in size. Right Atrium: Right atrial size was normal in size. Pericardium: There is no evidence of pericardial effusion. Mitral Valve: The mitral valve is normal in structure. Trivial mitral valve regurgitation. Tricuspid Valve: The tricuspid valve is normal in structure. Tricuspid valve regurgitation is trivial. Aortic Valve: The aortic valve is normal in structure. Aortic valve regurgitation is not visualized. Pulmonic Valve: The pulmonic valve was normal in structure. Pulmonic valve regurgitation is not visualized. Aorta: The ascending aorta was not well visualized. IAS/Shunts: No atrial level shunt detected by color flow Doppler. Additional Comments: 3D was performed not requiring image post processing on an independent workstation and was indeterminate.   LEFT VENTRICLE PLAX 2D LVIDd:         4.10 cm   Diastology LVIDs:         2.80 cm   LV e' medial:    6.53 cm/s LV PW:         1.00 cm   LV E/e' medial:  12.0 LV IVS:        0.90 cm   LV e' lateral:   5.87 cm/s LVOT diam:     2.00 cm   LV E/e' lateral: 13.3 LV SV:         44 LV SV Index:   28 LVOT Area:     3.14 cm  LEFT ATRIUM           Index LA diam:      2.70 cm 1.75 cm/m LA Vol (A4C): 9.9 ml  6.37 ml/m  AORTIC VALVE             PULMONIC VALVE LVOT Vmax:   82.20 cm/s  PV Vmax:        1.00 m/s LVOT Vmean:  50.100 cm/s PV Peak grad:   4.0 mmHg LVOT VTI:    0.140 m     RVOT Peak grad: 2 mmHg  AORTA Ao Root diam: 3.00 cm MITRAL VALVE               TRICUSPID VALVE MV Area (PHT): 4.01 cm    TR Peak grad:   25.6 mmHg MV Decel Time: 189 msec    TR Vmax:        253.00 cm/s MV E velocity: 78.10 cm/s MV A velocity: 93.30 cm/s  SHUNTS MV E/A ratio:  0.84        Systemic VTI:  0.14 m                            Systemic Diam: 2.00 cm Antonette Batters MD Electronically signed by Antonette Batters MD Signature Date/Time: 01/29/2024/4:01:12 PM    Final    DG Chest 2 View Result Date: 01/29/2024 CLINICAL DATA:  Short of breath, history of lung cancer status post radiation therapy EXAM: CHEST - 2 VIEW COMPARISON:  01/27/2024 FINDINGS: Frontal and lateral views of the chest demonstrate a stable cardiac silhouette. Areas of consolidation and scarring at the right lung base most consistent with post radiation changes in this patient with a history of lung cancer. No acute airspace disease, effusion, or pneumothorax. No acute  bony abnormalities. IMPRESSION: 1. Stable post therapeutic changes at the right lung base. 2. No acute intrathoracic process. Electronically Signed   By: Bobbye Burrow M.D.   On: 01/29/2024 13:42   US  ARTERIAL ABI (SCREENING LOWER EXTREMITY) Result Date: 01/29/2024 CLINICAL DATA:  Peripheral vascular disease and status post recent left femoral endarterectomy with mechanical thrombectomy of left SFA a,  left SFA and popliteal stent placement, tibial peroneal and posterior tibial angioplasty and left external iliac artery stent placement. EXAM: NONINVASIVE PHYSIOLOGIC VASCULAR STUDY OF BILATERAL LOWER EXTREMITIES TECHNIQUE: Evaluation of both lower extremities were performed at rest, including calculation of ankle-brachial indices with single level Doppler, pressure and pulse volume recording. COMPARISON:  Report from a prior study on 12/01/2023 FINDINGS: Right ABI:  0.75.  Previously 0.73 Left ABI:  0.88.  Previously 0.86 Right Lower Extremity:  Monophasic distal waveforms Left Lower Extremity:  Monophasic distal wave 0.5-0.79 Moderate PAD IMPRESSION: Resting left ABI of 0.88 after recent left lower extremity surgery and endovascular intervention. Stable resting right ABI of 0.75. Electronically Signed   By: Erica Hau M.D.   On: 01/29/2024 10:22   CT Angio Chest Pulmonary Embolism (PE) W or WO Contrast Result Date: 01/28/2024 CLINICAL DATA:  Wheezing EXAM: CT ANGIOGRAPHY CHEST WITH CONTRAST TECHNIQUE: Multidetector CT imaging of the chest was performed using the standard protocol during bolus administration of intravenous contrast. Multiplanar CT image reconstructions and MIPs were obtained to evaluate the vascular anatomy. RADIATION DOSE REDUCTION: This exam was performed according to the departmental dose-optimization program which includes automated exposure control, adjustment of the mA and/or kV according to patient size and/or use of iterative reconstruction technique. CONTRAST:  OMNIPAQUE  IOHEXOL  350 MG/ML SOLN COMPARISON:  Chest x-ray from the previous day.  CT from 09/29/2023 FINDINGS: Cardiovascular: Thoracic aorta and its branches demonstrate atherosclerotic calcification without aneurysmal dilatation. Coronary calcifications are noted. No cardiac enlargement is seen. The pulmonary artery shows a normal branching pattern bilaterally. No intraluminal filling defect to suggest pulmonary  embolism is seen. Mediastinum/Nodes: Thoracic inlet is within normal limits. The esophagus as visualized is unremarkable. No hilar or mediastinal adenopathy is noted. Lungs/Pleura: Lungs are well aerated bilaterally. Diffuse emphysematous changes are seen. Apical pleural and parenchymal scarring noted in the right apex. Diffuse infiltrate is seen within the right middle and lower lobes. These changes are similar to that seen on recent chest x-ray as well as prior CT and felt to be again related to prior radiation therapy. Stable left upper lobe nodule is noted on image number 38 of series 5 measuring 5 mm. Tiny right upper lobe nodule is noted stable in appearance. Upper Abdomen: Visualized upper abdomen is within normal limits. Musculoskeletal: Postsurgical changes are noted in the right chest wall stable from the prior exam. No acute bony abnormality is noted. Review of the MIP images confirms the above findings. IMPRESSION: No evidence of pulmonary emboli. Right lower lobe infiltrate of a chronic nature. Related to prior radiation Stable bilateral pulmonary nodules dating back to August of 2024. Follow-up CT in 6 months is recommended to assess for stability. Aortic Atherosclerosis (ICD10-I70.0) and Emphysema (ICD10-J43.9). Electronically Signed   By: Violeta Grey M.D.   On: 01/28/2024 03:18   DG Chest 1 View Result Date: 01/27/2024 CLINICAL DATA:  Wheezing. EXAM: CHEST  1 VIEW COMPARISON:  Same day chest radiograph dated 01/27/2024 at 12:24 p.m. FINDINGS: Stable cardiomediastinal silhouette. Similar right mid lung density, most compatible with radiation fibrosis as noted on the prior CT. Slightly increased right  basilar opacity could reflect atelectasis, however, a small layering pleural effusion can not be excluded. Left lung is clear. No pneumothorax. Visualized osseous structures are unchanged. IMPRESSION: 1. Slightly increased right basilar opacity could reflect atelectasis, however, a small layering  pleural effusion can not be excluded. 2. Similar right mid lung density, most compatible with radiation fibrosis as noted on the prior CT. Electronically Signed   By: Mannie Seek M.D.   On: 01/27/2024 20:13   DG Chest 2 View Result Date: 01/27/2024 CLINICAL DATA:  Bloody sputum. EXAM: CHEST - 2 VIEW COMPARISON:  September 29, 2023.  June 15, 2023. FINDINGS: The heart size and mediastinal contours are within normal limits. Left lung is clear. Stable right apical scarring is noted. Stable right midlung and basilar density is noted most consistent with radiation fibrosis as noted on prior CT scan. No acute osseous abnormality is noted. IMPRESSION: Stable chronic findings noted in right lung. No acute abnormality seen. Electronically Signed   By: Rosalene Colon M.D.   On: 01/27/2024 13:40   DG C-Arm 1-60 Min-No Report Result Date: 01/21/2024 Fluoroscopy was utilized by the requesting physician.  No radiographic interpretation.   DG C-Arm 1-60 Min-No Report Result Date: 01/21/2024 Fluoroscopy was utilized by the requesting physician.  No radiographic interpretation.   DG C-Arm 1-60 Min-No Report Result Date: 01/21/2024 Fluoroscopy was utilized by the requesting physician.  No radiographic interpretation.   PERIPHERAL VASCULAR CATHETERIZATION Result Date: 01/17/2024 See surgical note for result.  VAS US  ABI WITH/WO TBI Result Date: 01/06/2024  LOWER EXTREMITY DOPPLER STUDY Patient Name:  Angela Jensen  Date of Exam:   01/05/2024 Medical Rec #: [id number]     Accession #:    [id number] Date of Birth: [birth year]     Patient Gender: F Patient Age:   34 years Exam Location:  [facility] Procedure:      VAS US  ABI WITH/WO TBI Referring Phys: Mikki Alexander --------------------------------------------------------------------------------  Indications: Claudication, and peripheral artery disease. Other Factors: Left SFA stents are known to be occluded.                Patient complains of constant  numbness in left foot.                 07/29/2023: Aortogram and selective Left Lower                ExtremityAngiogram. Mechanical thrombectomy of the Left SFA and                Popliteal Artery with the [place] Rex device. Stent placement to the                Proximal Left SFA with 6 mm diameter by 5 cm length Viabahn                stent. Stent placement to the Left Popliteal Artery above the                knee with 6 mm diameter by 5 cm length Viabahn stent.                 12/20/2023: Aortogram and Selective Right Lower                ExtremityAngiogram. PTA of the Right SFA with 4 mm diameter by 22                cm length Lutonix drug coated angioplasty balloon. Stent  placement to the Right SFA with 6 mm by 20 cm length Life stent.  Vascular Interventions: 06/01/16: Left distal SFA/popliteal arteries PTAs with                         SFA stent x2;                         06/08/16: Left SFA PTA;                         09/29/18: Left SFA stent x2;. Comparison Study: 12/01/2023 Performing Technologist: Tonie Franks RVS  Examination Guidelines: A complete evaluation includes at minimum, Doppler waveform signals and systolic blood pressure reading at the level of bilateral brachial, anterior tibial, and posterior tibial arteries, when vessel segments are accessible. Bilateral testing is considered an integral part of a complete examination. Photoelectric Plethysmograph (PPG) waveforms and toe systolic pressure readings are included as required and additional duplex testing as needed. Limited examinations for reoccurring indications may be performed as noted.  ABI Findings: +---------+------------------+-----+--------+--------+ Right    Rt Pressure (mmHg)IndexWaveformComment  +---------+------------------+-----+--------+--------+ Brachial 115                                     +---------+------------------+-----+--------+--------+ ATA      100               0.87 biphasic          +---------+------------------+-----+--------+--------+ PTA      105               0.91 biphasic         +---------+------------------+-----+--------+--------+ Great Toe108               0.94 Normal           +---------+------------------+-----+--------+--------+ +---------+------------------+-----+-------------------+-------+ Left     Lt Pressure (mmHg)IndexWaveform           Comment +---------+------------------+-----+-------------------+-------+ Brachial 114                                               +---------+------------------+-----+-------------------+-------+ ATA      43                0.37 dampened monophasic        +---------+------------------+-----+-------------------+-------+ PTA                             Non pulsatile flow         +---------+------------------+-----+-------------------+-------+ PERO     46                0.40 dampened monophasic        +---------+------------------+-----+-------------------+-------+ Great Toe32                0.28                            +---------+------------------+-----+-------------------+-------+ +-------+-----------+-----------+------------+------------+ ABI/TBIToday's ABIToday's TBIPrevious ABIPrevious TBI +-------+-----------+-----------+------------+------------+ Right  .91        .94        .73         .85          +-------+-----------+-----------+------------+------------+  Left   .40        .28        .86         .94          +-------+-----------+-----------+------------+------------+ Right ABIs and TBIs appear increased compared to prior study on 12/01/2023. Left ABIs and TBIs appear decreased compared to prior study on 12/01/2023.  Summary: Right: Resting right ankle-brachial index indicates mild right lower extremity arterial disease. The right toe-brachial index is normal. Left: Resting left ankle-brachial index indicates severe left lower extremity arterial disease. The left  toe-brachial index is abnormal. *See table(s) above for measurements and observations.  Electronically signed by Mikki Alexander MD on 01/06/2024 at 8:12:02 AM.    Final     ECHO ordered.  06/15/2023  1. Left ventricular ejection fraction, by estimation, is >55%. The left ventricle has normal function. Left ventricular endocardial border not optimally defined to evaluate regional wall motion. Left ventricular diastolic parameters are consistent with Grade I diastolic dysfunction (impaired relaxation).   2. Right ventricular systolic function is normal. The right ventricular size is normal. Tricuspid regurgitation signal is inadequate for assessing PA pressure.   3. The mitral valve was not well visualized. Trivial mitral valve regurgitation.   4. Tricuspid valve regurgitation is mild to moderate.  5. The aortic valve was not well visualized. Aortic valve regurgitation is not visualized. No aortic stenosis is present.   6. The inferior vena cava is normal in size with greater than 50% respiratory variability, suggesting right atrial pressure of 3 mmHg.   TELEMETRY reviewed by me 01/31/2024: sinus rhythm, rate 70s  EKG reviewed by me: sinus rhythm, RBBB rate 91 bpm.  EKG 06/12: NSR with RBBB rate 99 bpm (unchanged from prior EKG)  Data reviewed by me 01/31/2024: last 24h vitals tele labs imaging I/O vascular surgery notes, Op note, hospitalist progress notes  Principal Problem:   Ischemic leg Active Problems:   Personal history of tobacco use, presenting hazards to health   Cancer of lower lobe of right lung (HCC)   Elevated troponin   Acute on chronic respiratory failure with hypoxia and hypercapnia (HCC)   Atherosclerosis of artery of extremity with rest pain (HCC)   S/p femoral endarterectomy 01/21/2024   Chronic heart failure with preserved ejection fraction (HFpEF) (HCC)   SSS (sick sinus syndrome) s/p leadless pacemaker [facility])    ASSESSMENT AND PLAN:  Angela Jensen is a 68 y.o. female  with a  past medical history of chronic HFpEF, SSS s/p micra (06/2021), hypertension, hyperlipidemia, aortic atherosclerosis, IDDM, PVD, COPD/asthma (baseline 2L O2), OSA, tobacco use who presented for outpatient procedure on 01/17/2024 underwent lower extremity angiogram on 06/02 and found that patient will need a left femoral endarterectomy with illiac stents and SFA intervention. Cardiology was consulted for preoperative cardiac evaluation (06/02), consulted for elevated troponins (06/13).  # Acute on chronic respiratory failure with hypoxia and hypercapnia # COPD exacerbation Patient reports last night (06/13) she got out of bed to go to bedside commode and her O2 accidentally disconnected. Patient states when she had no O2 she couldn't breath and felt very distressed. Patient states she requires 3L O2 continuously. Patient states she had no chest pain/pressure doing this event. Patient required BiPAP after this respiratory decompensation. Patient now on 3L this AM and reports breathing is much improved. -Recommend continuing breathing treatments for COPD.  -Pulmonology consulted, appreciate recommendations.  # Demand ischemia  # Hypertension # Hyperlipidemia # Chronic HFpEF # SSS s/p  micra (06/2021) Patient with PAD with rest pain LLE underwent left lower extremity angiogram that demonstrated 70% stenosis of left common femoral artery on 06/02. Patient requiring left femoral endarterectomy with illiac stents and SFA intervention. Echo from 05/2023 with pEF and low risk. EKG from (06/03) with sinus rhythm, RBBB is unchanged from prior EKG, no acute ischemic changes. During respiratory distress event tropnins were drawn and found to be elevated. Trops elevated and flat 50 > 130 > 260 > 230. BNP within normal limits. EKG on 06/12 with NSR with RBBB rate 99 bpm without acute ischemic changes. (unchanged from prior EKG). Echo this admission with pEF, no RWMA (unchanged from prior echo).  -Continue ASA 81 mg,  atorvastatin  10 mg daily. -Continue metoprolol  succinate to 50 mg daily. -Continue losartan 25 mg daily. -Plan to optimize GDMT if BP and renal function allows.  -Elevated and flat trops in setting of acute on chronic respiratory failure and likely COPD exacerbation is most consistent with demand/mismatch ischemia and not ACS.  -Due to patients multiple comorbidties and risk factors for coronary artery disease would recommend outpatient cardiac stress testing. -Will continue to follow patient until discharge.   #Preop Cardiac Evaluation # PAD with Left common femoral artery stenosis s/p left femoral endarterectomy POD#7 s/p left femoral endarterectomy, iliac stent, SFA and tibial intervention.  -Management per vascular surgery.    This patient's plan of care was discussed and created with Dr. Beau Bound and he is in agreement.  Signed: Creighton Doffing, PA-C  01/31/2024, 11:03 AM [facility]

## 2024-01-31 NOTE — Inpatient Diabetes Management (Signed)
 Inpatient Diabetes Program Recommendations  AACE/ADA: New Consensus Statement on Inpatient Glycemic Control   Target Ranges:  Prepandial:   less than 140 mg/dL      Peak postprandial:   less than 180 mg/dL (1-2 hours)      Critically ill patients:  140 - 180 mg/dL    Latest Reference Range & Units 01/29/24 07:59 01/29/24 11:50 01/29/24 16:09 01/29/24 22:05  Glucose-Capillary 70 - 99 mg/dL 161 (H) 096 (H) 045 (H) 165 (H)    Latest Reference Range & Units 01/30/24 08:33 01/30/24 12:52 01/30/24 16:18 01/30/24 20:23 01/31/24 07:33  Glucose-Capillary 70 - 99 mg/dL 409 (H) 811 (H) 914 (H) 327 (H) 165 (H)   Review of Glycemic Control  Outpatient DM medications: Toujeo  10 units daily, Metformin  XR 1000 mg QAM Current orders for Inpatient glycemic control: Novolog  0-20 units AC&HS; Prednisone  20 mg QAM  Inpatient Diabetes Program Recommendations:    Insulin : If steroids are continued, please consider ordering Novolog  4 units TID with meals for meal coverage if patient eats at least 50% of meals.  Thanks, Beacher Limerick, RN, MSN, CDCES Diabetes Coordinator Inpatient Diabetes Program [phone] (Team Pager from 8am to 5pm)

## 2024-01-31 NOTE — Progress Notes (Signed)
 Physical Therapy Treatment Patient Details Name: Angela Jensen MRN: [id number] DOB: Aug 24, 1955 Today's Date: 01/31/2024   History of Present Illness Angela Jensen is a 68yoF who comes [facility] on 6[facility] due to ischemic limb, now s/p left femoral endarterectomy (6/2 and 6/6). Pt showed elevated troponin on 6/13, cardiology following attributes to demand ischemia. PMH: heart failure, SSS s/p micra, HTN, aortic atherosclerosis, IDDM, PVD, COPD on 2L baseline, OSA, tobacco use.    PT Comments  Pt recently finished a breathing treatment; SpO2 at 88% on 4L upon arrival. Pt moved to 6L/min for session, sats 89% when assessed. Pt able to AMB 2-220ft bouts with a ~3 minute recovery interval. Dyspnea remains at goal, however pt is maintained at 6L/min for session. HR WNL.Pt able to show improvement in AMB tolerance this date. Will continue to follow.    If plan is discharge home, recommend the following: Assist for transportation;Help with stairs or ramp for entrance;Assistance with cooking/housework   Can travel by private vehicle        Equipment Recommendations  None recommended by PT    Recommendations for Other Services OT consult     Precautions / Restrictions Precautions Precautions: Fall Recall of Precautions/Restrictions: Intact Precaution/Restrictions Comments: watch HR, O2     Mobility  Bed Mobility                    Transfers   Equipment used: Rolling walker (2 wheels) Transfers: Sit to/from Stand Sit to Stand: Supervision                Ambulation/Gait Ambulation/Gait assistance: Supervision Gait Distance (Feet): 200 Feet Assistive device: Rolling walker (2 wheels)   Gait velocity: 0.73m/s     General Gait Details: 274ft AMB with RW, seated break, then 200ft AMB again; 6L/min both bouts and resting interval. (89% SpO2 throughout)   Stairs             Wheelchair Mobility     Tilt Bed    Modified Rankin (Stroke Patients Only)       Balance                                             Communication    Cognition                                        Cueing    Exercises      General Comments        Pertinent Vitals/Pain Pain Assessment Pain Assessment: No/denies pain    Home Living                          Prior Function            PT Goals (current goals can now be found in the care plan section) Acute Rehab PT Goals Patient Stated Goal: return home PT Goal Formulation: With patient Time For Goal Achievement: 02/05/24 Potential to Achieve Goals: Good Progress towards PT goals: Progressing toward goals    Frequency    Min 2X/week      PT Plan      Co-evaluation              AM-PAC PT 6 Clicks Mobility  Outcome Measure  Help needed turning from your back to your side while in a flat bed without using bedrails?: None Help needed moving from lying on your back to sitting on the side of a flat bed without using bedrails?: None Help needed moving to and from a bed to a chair (including a wheelchair)?: None Help needed standing up from a chair using your arms (e.g., wheelchair or bedside chair)?: None Help needed to walk in hospital room?: None Help needed climbing 3-5 steps with a railing? : A Little 6 Click Score: 23    End of Session Equipment Utilized During Treatment: Oxygen  Activity Tolerance: Patient tolerated treatment well;No increased pain;Patient limited by fatigue Patient left: with call bell/phone within reach;with nursing/sitter in room;in chair Nurse Communication: Mobility status PT Visit Diagnosis: Pain;Muscle weakness (generalized) (M62.81);Difficulty in walking, not elsewhere classified (R26.2) Pain - Right/Left: Left Pain - part of body: Leg     Time: 1610-9604 PT Time Calculation (min) (ACUTE ONLY): 19 min  Charges:    $Therapeutic Activity: 8-22 mins PT General Charges $$ ACUTE PT VISIT: 1 Visit                     3:13 PM, 01/31/24 Dawn Eth, PT, DPT Physical Therapist - [facility]  [phone] (ASCOM)    Carine Nordgren C 01/31/2024, 3:10 PM

## 2024-02-01 LAB — GLUCOSE, CAPILLARY
Glucose-Capillary: 185 mg/dL — ABNORMAL HIGH (ref 70–99)
Glucose-Capillary: 222 mg/dL — ABNORMAL HIGH (ref 70–99)
Glucose-Capillary: 332 mg/dL — ABNORMAL HIGH (ref 70–99)
Glucose-Capillary: 255 mg/dL — ABNORMAL HIGH (ref 70–99)

## 2024-02-01 MED ORDER — TRANEXAMIC ACID FOR INHALATION
500.0000 mg | Freq: Three times a day (TID) | RESPIRATORY_TRACT | Status: DC
  Administered 2024-02-02: 500 mg via RESPIRATORY_TRACT
  Filled 2024-02-01: qty 5

## 2024-02-01 MED ORDER — TRANEXAMIC ACID FOR INHALATION
500.0000 mg | Freq: Three times a day (TID) | RESPIRATORY_TRACT | Status: DC
  Administered 2024-02-01 (×2): 500 mg via RESPIRATORY_TRACT
  Filled 2024-02-01 (×2): qty 5

## 2024-02-01 NOTE — Progress Notes (Signed)
 PULMONOLOGY         Date: 02/01/2024,   MRN# [id number] Angela Jensen [birth year]     AdmissionWeight: 54 kg                 CurrentWeight: 59.9 kg  Referring provider: Dr Melvinia Stager   CHIEF COMPLAINT:   Non-massive hemoptysis   HISTORY OF PRESENT ILLNESS   68 year old lady with a history of asthma and COPD overlap coronary artery disease on antiplatelet agents recent onset of nonmassive hemoptysis and acute on chronic hypoxemia.  Also has a background history of dyslipidemia chronic hypoxemia requiring 2 L supplemental O2 at night peripheral vascular disease sleep apnea history of lung cancer on the right status post full scope of treatment came in with COPD with acute on chronic exacerbation with hypoxemia as well as non massive hemoptysis consultation for pulmonary placed due to ongoing NON massive hemoptysis with hypoxemia.   01/31/24- patient got up and walked around today, reports feeling better near baseline.  Is on 3L/min [place]  02/01/24- patient still has mild non massive hemoptysis, will continue nebulized txa. She is on blood thinners.  She requests to stay one more day to improve to baseline.  Discussed care plan with daughter Nils Baseman  PAST MEDICAL HISTORY   Past Medical History:  Diagnosis Date   Anemia    Arthritis    Asthma    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/2016   Cancer (HCC) 2012   Right Lung CA   COPD (chronic obstructive pulmonary disease) (HCC)    Depression    Diabetes mellitus without complication (HCC)    Patient takes Janumet   Essential hypertension 05/26/2016   Heart failure (HCC) 2022   Hydropneumothorax 05/03/2020   Hypercholesteremia    Oxygen  dependent    2L at nite    PAD (peripheral artery disease) (HCC) 06/22/2016   Peripheral vascular disease (HCC)    Personal history of radiation therapy    Shortness of breath dyspnea    with exertion    Sialolithiasis    Sleep apnea     Wears dentures    full upper and lower     SURGICAL HISTORY   Past Surgical History:  Procedure Laterality Date   APPLICATION OF CELL SAVER Left 01/21/2024   Procedure: APPLICATION OF CELL SAVER;  Surgeon: Celso College, MD;  Location: [facility];  Service: Vascular;  Laterality: Left;   CESAREAN SECTION     x3   COLONOSCOPY WITH PROPOFOL  N/A 06/25/2015   Procedure: COLONOSCOPY WITH PROPOFOL ;  Surgeon: Marnee Sink, MD;  Location: [facility];  Service: Endoscopy;  Laterality: N/A;   COLONOSCOPY WITH PROPOFOL  N/A 07/26/2020   Procedure: COLONOSCOPY WITH PROPOFOL ;  Surgeon: Marnee Sink, MD;  Location: [facility];  Service: Endoscopy;  Laterality: N/A;   CYST REMOVAL LEG     and on shoulder    ENDARTERECTOMY FEMORAL Left 01/21/2024   Procedure: LEFT COMMON, SUPERFICIAL FEMORAL AND PROFUNDA FEMORIS ENDARTERECTOMY WITH BOVINE PERICARDIAL PATCH ANGIOPLASTY;  Surgeon: Celso College, MD;  Location: [facility];  Service: Vascular;  Laterality: Left;   ESOPHAGOGASTRODUODENOSCOPY (EGD) WITH PROPOFOL  N/A 07/26/2020   Procedure: ESOPHAGOGASTRODUODENOSCOPY (EGD) WITH PROPOFOL ;  Surgeon: Marnee Sink, MD;  Location: [facility];  Service: Endoscopy;  Laterality: N/A;  Diabetic - oral meds   INSERTION OF ILIAC STENT Left 01/21/2024   Procedure: INSERTION, STENT, ARTERY, ILIAC;  Surgeon: Celso College, MD;  Location: [facility];  Service: Vascular;  Laterality: Left;  AND SFA STENTS   LOWER EXTREMITY ANGIOGRAPHY Left 09/29/2018   Procedure: LOWER EXTREMITY ANGIOGRAPHY;  Surgeon: Celso College, MD;  Location: [facility];  Service: Cardiovascular;  Laterality: Left;   LOWER EXTREMITY ANGIOGRAPHY Left 07/29/2023   Procedure: Lower Extremity Angiography;  Surgeon: Celso College, MD;  Location: [facility];  Service: Cardiovascular;  Laterality: Left;   LOWER EXTREMITY ANGIOGRAPHY Right 12/20/2023   Procedure: Lower Extremity Angiography;  Surgeon: Celso College, MD;   Location: [facility];  Service: Cardiovascular;  Laterality: Right;   LOWER EXTREMITY ANGIOGRAPHY Left 01/17/2024   Procedure: Lower Extremity Angiography;  Surgeon: Celso College, MD;  Location: [facility];  Service: Cardiovascular;  Laterality: Left;   LUNG BIOPSY  12/30/2011   has lung spots   PACEMAKER IMPLANT  07/14/2021   PACEMAKER LEADLESS INSERTION N/A 07/14/2021   Procedure: PACEMAKER LEADLESS INSERTION;  Surgeon: Percival Brace, MD;  Location: [facility];  Service: Cardiovascular;  Laterality: N/A;   PERIPHERAL VASCULAR CATHETERIZATION Left 06/01/2016   Procedure: Lower Extremity Angiography;  Surgeon: Celso College, MD;  Location: [facility];  Service: Cardiovascular;  Laterality: Left;   PERIPHERAL VASCULAR CATHETERIZATION N/A 06/01/2016   Procedure: Abdominal Aortogram w/Lower Extremity;  Surgeon: Celso College, MD;  Location: [facility];  Service: Cardiovascular;  Laterality: N/A;   PERIPHERAL VASCULAR CATHETERIZATION  06/01/2016   Procedure: Lower Extremity Intervention;  Surgeon: Celso College, MD;  Location: [facility];  Service: Cardiovascular;;   PERIPHERAL VASCULAR CATHETERIZATION Right 06/08/2016   Procedure: Lower Extremity Angiography;  Surgeon: Celso College, MD;  Location: [facility];  Service: Cardiovascular;  Laterality: Right;   PERIPHERAL VASCULAR CATHETERIZATION  06/08/2016   Procedure: Lower Extremity Intervention;  Surgeon: Celso College, MD;  Location: [facility];  Service: Cardiovascular;;   SUBMANDIBULAR GLAND EXCISION Left 12/06/2020   Procedure: EXCISION SUBMANDIBULAR GLAND;  Surgeon: Lesly Raspberry, MD;  Location: [facility];  Service: ENT;  Laterality: Left;  needs to be first case Diabetic - diet controlled   TEMPORARY PACEMAKER N/A 07/11/2021   Procedure: TEMPORARY PACEMAKER;  Surgeon: Percival Brace, MD;  Location: [facility];  Service:  Cardiovascular;  Laterality: N/A;     FAMILY HISTORY   Family History  Problem Relation Age of Onset   Diabetes Mother    Hypercholesterolemia Mother    Lung cancer Father    Diabetes Sister    Diabetes Sister    Hypertension Sister    Diabetes Maternal Grandmother    Diabetes Paternal Grandmother    Heart attack Brother    Coronary artery disease Brother    Vascular Disease Brother    Heart attack Brother    Breast cancer Neg Hx      SOCIAL HISTORY   Social History   Tobacco Use   Smoking status: Former    Current packs/day: 0.00    Average packs/day: 1 pack/day for 37.0 years (37.0 ttl pk-yrs)    Types: Cigarettes    Start date: 02/06/1973    Quit date: 02/06/2010    Years since quitting: 13.9   Smokeless tobacco: Former    Types: Snuff  Vaping Use   Vaping status: Never Used  Substance Use Topics   Alcohol use: Not Currently    Alcohol/week: 5.0 standard drinks of alcohol    Types: 5 Cans of beer per week  Comment: /h x of alcohol abuse -stopped 2012- now drinks 5 beer per week   Drug use: Not Currently    Types: Marijuana, Crack cocaine, Cocaine    Comment: hx of cocaine use- last use 2015; last use marijuana6/22/19,     MEDICATIONS    Home Medication:  Current Outpatient Rx   Order #: [id number] Class: Normal   Order #: [id number] Class: Normal   Order #: [id number] Class: Normal   Order #: [id number] Class: Normal   Order #: [id number] Class: Normal   Order #: [id number] Class: Normal   Order #: [id number] Class: Normal    Current Medication:  Current Facility-Administered Medications:    acetaminophen  (TYLENOL ) tablet 325-650 mg, 325-650 mg, Oral, Q4H PRN **OR** acetaminophen  (TYLENOL ) suppository 325-650 mg, 325-650 mg, Rectal, Q4H PRN, Vonna Guardian, Donald Frost, MD   albuterol  (PROVENTIL ) (2.5 MG/3ML) 0.083% nebulizer solution 2.5 mg, 2.5 mg, Nebulization, Q6H, Dew, Jason S, MD, 2.5 mg at 02/01/24 5643   ALPRAZolam  (XANAX ) tablet 0.5 mg, 0.5 mg, Oral, BID PRN,  Dew, Jason S, MD, 0.5 mg at 01/29/24 1627   alum & mag hydroxide-simeth (MAALOX/MYLANTA) 200-200-20 MG/5ML suspension 15-30 mL, 15-30 mL, Oral, Q2H PRN, Celso College, MD, 30 mL at 02/01/24 0654   aspirin  EC tablet 81 mg, 81 mg, Oral, Daily, Dew, Jason S, MD, 81 mg at 01/31/24 3295   atorvastatin  (LIPITOR) tablet 10 mg, 10 mg, Oral, Daily, Dew, Jason S, MD, 10 mg at 01/31/24 0813   calcium  carbonate (OS-CAL - dosed in mg of elemental calcium ) tablet 1,250 mg, 500 mg of elemental calcium , Oral, BID WC, Dew, Jason S, MD, 1,250 mg at 01/31/24 1717   cholecalciferol  (VITAMIN D3) 25 MCG (1000 UNIT) tablet 1,000 Units, 1,000 Units, Oral, q AM, Dew, Donald Frost, MD, 1,000 Units at 02/01/24 0608   clopidogrel  (PLAVIX ) tablet 75 mg, 75 mg, Oral, Daily, Pace, Brien R, NP, 75 mg at 01/31/24 1884   cyclobenzaprine  (FLEXERIL ) tablet 10 mg, 10 mg, Oral, QHS PRN, Dew, Jason S, MD, 10 mg at 01/30/24 2025   diphenhydrAMINE  (BENADRYL ) capsule 25 mg, 25 mg, Oral, Q6H PRN, Celso College, MD, 25 mg at 01/23/24 2201   escitalopram  (LEXAPRO ) tablet 10 mg, 10 mg, Oral, Daily, Dew, Donald Frost, MD, 10 mg at 01/31/24 1660   famotidine  (PEPCID ) tablet 20 mg, 20 mg, Oral, Daily PRN, Celso College, MD   ferrous sulfate  tablet 325 mg, 325 mg, Oral, BID WC, Dew, Jason S, MD, 325 mg at 01/31/24 1717   fluticasone  furoate-vilanterol (BREO ELLIPTA ) 100-25 MCG/ACT 1 puff, 1 puff, Inhalation, Daily, Dew, Donald Frost, MD, 1 puff at 01/31/24 6301   gabapentin  (NEURONTIN ) capsule 300 mg, 300 mg, Oral, TID, Dew, Jason S, MD, 300 mg at 01/31/24 2227   guaiFENesin -dextromethorphan (ROBITUSSIN DM) 100-10 MG/5ML syrup 15 mL, 15 mL, Oral, Q4H PRN, Dew, Donald Frost, MD   hydrALAZINE  (APRESOLINE ) injection 5 mg, 5 mg, Intravenous, Q20 Min PRN, Dew, Donald Frost, MD   insulin  aspart (novoLOG ) injection 0-20 Units, 0-20 Units, Subcutaneous, TID AC & HS, Elisabeth Guild, NP, 11 Units at 01/31/24 2228   labetalol  (NORMODYNE ) injection 10 mg, 10 mg, Intravenous, Q10 min  PRN, Celso College, MD, 10 mg at 01/30/24 1309   losartan (COZAAR) tablet 25 mg, 25 mg, Oral, Daily, Decoste, Gabriella, PA-C, 25 mg at 01/31/24 6010   magnesium  hydroxide (MILK OF MAGNESIA) suspension 30 mL, 30 mL, Oral, Daily PRN, Celso College, MD, 30 mL at 01/25/24 1131   metoprolol  succinate (TOPROL -XL) 24 hr tablet  50 mg, 50 mg, Oral, Daily, Decoste, Gabriella, PA-C, 50 mg at 01/31/24 1610   metoprolol  tartrate (LOPRESSOR ) injection 2-5 mg, 2-5 mg, Intravenous, Q2H PRN, Vonna Guardian, Donald Frost, MD   ondansetron  (ZOFRAN ) injection 4 mg, 4 mg, Intravenous, Q6H PRN, Celso College, MD, 4 mg at 01/18/24 1447   oxyCODONE -acetaminophen  (PERCOCET/ROXICET) 5-325 MG per tablet 1-2 tablet, 1-2 tablet, Oral, Q4H PRN, Celso College, MD, 2 tablet at 01/31/24 1545   pantoprazole  (PROTONIX ) EC tablet 40 mg, 40 mg, Oral, Daily, Dew, Donald Frost, MD, 40 mg at 01/31/24 0812   phenol (CHLORASEPTIC) mouth spray 1 spray, 1 spray, Mouth/Throat, PRN, Vonna Guardian, Donald Frost, MD   predniSONE  (DELTASONE ) tablet 20 mg, 20 mg, Oral, Q breakfast, Patel, Sona, MD, 20 mg at 01/31/24 0813   sodium phosphate  (FLEET) enema 1 enema, 1 enema, Rectal, Daily PRN, Celso College, MD   sorbitol  70 % solution 30 mL, 30 mL, Oral, Daily PRN, Vonna Guardian, Donald Frost, MD   sulfamethoxazole-trimethoprim (BACTRIM) 400-80 MG per tablet 1 tablet, 1 tablet, Oral, Q12H, Shiara Mcgough, MD, 1 tablet at 01/31/24 2227   umeclidinium bromide  (INCRUSE ELLIPTA ) 62.5 MCG/ACT 1 puff, 1 puff, Inhalation, Daily, Dew, Donald Frost, MD, 1 puff at 01/30/24 0859   vitamin B-12 (CYANOCOBALAMIN ) tablet 100 mcg, 100 mcg, Oral, Daily, Dew, Donald Frost, MD, 100 mcg at 01/31/24 9604    ALLERGIES   Iodinated contrast media, Trelegy ellipta  [fluticasone -umeclidin-vilant], and Bactoshield chg [chlorhexidine  gluconate]     REVIEW OF SYSTEMS    Review of Systems:  Gen:  Denies  fever, sweats, chills weigh loss  HEENT: Denies blurred vision, double vision, ear pain, eye pain, hearing loss, nose bleeds,  sore throat Cardiac:  No dizziness, chest pain or heaviness, chest tightness,edema Resp:   reports dyspnea chronically  Gi: Denies swallowing difficulty, stomach pain, nausea or vomiting, diarrhea, constipation, bowel incontinence Gu:  Denies bladder incontinence, burning urine Ext:   Denies Joint pain, stiffness or swelling Skin: Denies  skin rash, easy bruising or bleeding or hives Endoc:  Denies polyuria, polydipsia , polyphagia or weight change Psych:   Denies depression, insomnia or hallucinations   Other:  All other systems negative   VS: BP 128/74 (BP Location: Right Arm)   Pulse 71   Temp 97.6 F (36.4 C)   Resp 20   Ht 4' 11 (1.499 m)   Wt 59.9 kg   SpO2 (P) 97%   BMI 26.67 kg/m      PHYSICAL EXAM    GENERAL:NAD, no fevers, chills, no weakness no fatigue HEAD: Normocephalic, atraumatic.  EYES: Pupils equal, round, reactive to light. Extraocular muscles intact. No scleral icterus.  MOUTH: Moist mucosal membrane. Dentition intact. No abscess noted.  EAR, NOSE, THROAT: Clear without exudates. No external lesions.  NECK: Supple. No thyromegaly. No nodules. No JVD.  PULMONARY: decreased breath sounds with mild rhonchi worse at bases bilaterally.  CARDIOVASCULAR: S1 and S2. Regular rate and rhythm. No murmurs, rubs, or gallops. No edema. Pedal pulses 2+ bilaterally.  GASTROINTESTINAL: Soft, nontender, nondistended. No masses. Positive bowel sounds. No hepatosplenomegaly.  MUSCULOSKELETAL: No swelling, clubbing, or edema. Range of motion full in all extremities.  NEUROLOGIC: Cranial nerves II through XII are intact. No gross focal neurological deficits. Sensation intact. Reflexes intact.  SKIN: No ulceration, lesions, rashes, or cyanosis. Skin warm and dry. Turgor intact.  PSYCHIATRIC: Mood, affect within normal limits. The patient is awake, alert and oriented x 3. Insight, judgment intact.       IMAGING  ASSESSMENT/PLAN   Non massive hemoptysis     -  patient is on antiplatelet agent but has blood streaked cough only.  Will treat with nebulized tXA x q8h x 1 day    - there is a right lower lobe infiltrate , I think she would benefit from empiric treatment with bactrim PO BID to cover for cAP and MRSA    - continue incentive spirometry    COPD with centrilobular emphysema    - continue albuterol  and incruse ellipta     - continue prednisone  20mg       Right lower lobe atelectasis    - continue incentive spirometry    Thank you for allowing me to participate in the care of this patient.   Patient/Family are satisfied with care plan and all questions have been answered.    Provider disclosure: Patient with at least one acute or chronic illness or injury that poses a threat to life or bodily function and is being managed actively during this encounter.  All of the below services have been performed independently by signing provider:  review of prior documentation from internal and or external health records.  Review of previous and current lab results.  Interview and comprehensive assessment during patient visit today. Review of current and previous chest radiographs/CT scans. Discussion of management and test interpretation with health care team and patient/family.   This document was prepared using Dragon voice recognition software and may include unintentional dictation errors.     Langdon Crosson, M.D.  Division of Pulmonary & Critical Care Medicine

## 2024-02-01 NOTE — Progress Notes (Signed)
 Occupational Therapy Re Evaluation Patient Details Name: Angela Jensen MRN: [id number] DOB: 04/02/1956 Today's Date: 02/01/2024   History of Present Illness   Angela Jensen is a 68yoF who comes to [facility] on 6/2 due to ischemic limb, now s/p left femoral endarterectomy (6/2 and 6/6). Pt showed elevated troponin on 6/13, cardiology following attributes to demand ischemia. Pt with RR on 6/12 for SOB and required BiPAP, transferred to cardiac progressive unit. PMH: heart failure, SSS s/p micra, HTN, aortic atherosclerosis, IDDM, PVD, COPD on 2L baseline, OSA, tobacco use.     Clinical Impressions Pt seen for re-evaluation this date. Pt making excellent progress towards goals; current goals + POC updated to reflect functional gains. Pt performs functional transfers, mobility, toileting tasks and grooming using RW + supervision with assist for O2 tank mgmt. On 3L/min O2, required increase to 6L for brief period during hallway ambulation to maintain 90%> SpO2. Discussed energy conservation strategies with pt - she verbalizes understanding and voices that daughter will provide support at discharge. Discharge recommendation appropriate, OT will continue to follow. Pt would benefit from skilled OT services to address noted impairments and functional limitations (see below for any additional details) in order to maximize safety and independence while minimizing falls risk and caregiver burden. Anticipate the need for follow up [facility] OT services upon acute hospital DC.      If plan is discharge home, recommend the following:   A little help with walking and/or transfers;A little help with bathing/dressing/bathroom;Assist for transportation      Equipment Recommendations   None recommended by OT      Precautions/Restrictions   Precautions Precautions: Fall Recall of Precautions/Restrictions: Intact Precaution/Restrictions Comments: watch HR, O2 Restrictions Weight Bearing Restrictions Per Provider  Order: No     Mobility Bed Mobility               General bed mobility comments: NT    Transfers Overall transfer level: Needs assistance Equipment used: Rolling walker (2 wheels) Transfers: Sit to/from Stand Sit to Stand: Supervision     Step pivot transfers: Supervision     General transfer comment: increased time, no physical assistance required      Balance Overall balance assessment: Needs assistance Sitting-balance support: Feet supported Sitting balance-Leahy Scale: Good Sitting balance - Comments: Steady reaching with BOS   Standing balance support: Bilateral upper extremity supported Standing balance-Leahy Scale: Good Standing balance comment: no LOB standing at sink for washing hands without UE support                           ADL either performed or assessed with clinical judgement   ADL Overall ADL's : Needs assistance/impaired     Grooming: Wash/dry face;Standing;Supervision/safety Grooming Details (indicate cue type and reason): sink level                 Toilet Transfer: Supervision/safety;Regular Toilet;Grab bars   Toileting- Clothing Manipulation and Hygiene: Supervision/safety;Sitting/lateral lean       Functional mobility during ADLs: Supervision/safety;Rolling walker (2 wheels) General ADL Comments: functional mobility in hallway +200 ft, toileting needs supervision level using RW      Pertinent Vitals/Pain Pain Assessment Pain Score: 9  Pain Location: LLE Pain Descriptors / Indicators: Discomfort Pain Intervention(s): Patient requesting pain meds-RN notified, RN gave pain meds during session, Repositioned, Monitored during session        Communication Communication Communication: No apparent difficulties Factors Affecting Communication: Hearing impaired   Cognition  Arousal: Alert Behavior During Therapy: WFL for tasks assessed/performed Cognition: No apparent impairments                                Following commands: Intact       Cueing  General Comments   Cueing Techniques: Verbal cues  SpO2 on 3L/min throughout session. Increased to 6L for brief period while ambulating in hallway as sats drop to 85%. With standing rest breaks, pt able to quickly recover and maintain 90%> on 3L baseline. HR WNL throughout.      OT Goals(Current goals can be found in the care plan section)   Acute Rehab OT Goals OT Goal Formulation: With patient Time For Goal Achievement: 02/18/24 Potential to Achieve Goals: Good ADL Goals Pt Will Perform Grooming: with modified independence;standing Pt Will Perform Lower Body Dressing: with modified independence;sit to/from stand Pt Will Transfer to Toilet: with modified independence;ambulating;regular height toilet Pt Will Perform Toileting - Clothing Manipulation and hygiene: with modified independence;sitting/lateral leans;sit to/from stand   OT Frequency:  Min 2X/week       AM-PAC OT 6 Clicks Daily Activity     Outcome Measure Help from another person eating meals?: None Help from another person taking care of personal grooming?: None Help from another person toileting, which includes using toliet, bedpan, or urinal?: A Little Help from another person bathing (including washing, rinsing, drying)?: A Little Help from another person to put on and taking off regular upper body clothing?: None Help from another person to put on and taking off regular lower body clothing?: A Little 6 Click Score: 21   End of Session Equipment Utilized During Treatment: Gait belt;Rolling walker (2 wheels);Oxygen  Nurse Communication: Mobility status  Activity Tolerance: Patient tolerated treatment well Patient left: in chair;with call bell/phone within reach;with chair alarm set  OT Visit Diagnosis: Unsteadiness on feet (R26.81);Other abnormalities of gait and mobility (R26.89)                Time: 1117-1140 OT Time Calculation (min): 23  min Charges:  OT General Charges $OT Visit: 1 Visit OT Evaluation $OT Re-eval: 1 Re-eval OT Treatments $Self Care/Home Management : 8-22 mins  Angela Jensen L. Edwinna Rochette, OTR/L  02/01/24, 1:06 PM

## 2024-02-01 NOTE — Plan of Care (Signed)

## 2024-02-01 NOTE — Progress Notes (Signed)
 [facility] CLINIC CARDIOLOGY PROGRESS NOTE       Patient ID: NOLAN LASSER MRN: [id number] DOB/AGE: [birth year] 68 y.o.  Admit date: 01/17/2024 Referring Physician Dr. Mikki Alexander Primary Physician Laurence Pons, NP Primary Cardiologist Dr. Beau Bound Reason for Consultation POC  HPI: SILENA WYSS is a 68 y.o. female  with a past medical history of chronic HFpEF, SSS s/p micra (06/2021), hypertension, hyperlipidemia, aortic atherosclerosis, IDDM, PVD, COPD/asthma (baseline 2L O2), OSA, tobacco use who presented for outpatient procedure on 01/17/2024 underwent lower extremity angiogram on 06/02 and found that patient will need a left femoral endarterectomy with illiac stents and SFA intervention. Cardiology was consulted for preoperative cardiac evaluation (06/02), consulted for elevated troponins (06/13).  Interval History: -POD 11 s/p left femoral endarterectomy, iliac stent, SFA and tibial intervention . -Patient seen and examined this AM. Patient states her SOB is improving but not at baseline. Patient reports breathing treatments have helped a lot. -Patient states she still has some cough but it has improved. -Encouraged use of spirometry. -Patient continues to deny any chest pain/pressure or palpitations.  -Patients BP elevated and HR stable.   -Patient on 3L with stable SpO2. Patient on continues 3L  at home.  Review of systems complete and found to be negative unless listed above    Past Medical History:  Diagnosis Date   Anemia    Arthritis    Asthma    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/2016   Cancer (HCC) 2012   Right Lung CA   COPD (chronic obstructive pulmonary disease) (HCC)    Depression    Diabetes mellitus without complication (HCC)    Patient takes Janumet   Essential hypertension 05/26/2016   Heart failure (HCC) 2022   Hydropneumothorax 05/03/2020   Hypercholesteremia    Oxygen  dependent    2L at nite     PAD (peripheral artery disease) (HCC) 06/22/2016   Peripheral vascular disease (HCC)    Personal history of radiation therapy    Shortness of breath dyspnea    with exertion    Sialolithiasis    Sleep apnea    Wears dentures    full upper and lower    Past Surgical History:  Procedure Laterality Date   APPLICATION OF CELL SAVER Left 01/21/2024   Procedure: APPLICATION OF CELL SAVER;  Surgeon: Celso College, MD;  Location: [facility];  Service: Vascular;  Laterality: Left;   CESAREAN SECTION     x3   COLONOSCOPY WITH PROPOFOL  N/A 06/25/2015   Procedure: COLONOSCOPY WITH PROPOFOL ;  Surgeon: Marnee Sink, MD;  Location: [facility];  Service: Endoscopy;  Laterality: N/A;   COLONOSCOPY WITH PROPOFOL  N/A 07/26/2020   Procedure: COLONOSCOPY WITH PROPOFOL ;  Surgeon: Marnee Sink, MD;  Location: [facility];  Service: Endoscopy;  Laterality: N/A;   CYST REMOVAL LEG     and on shoulder    ENDARTERECTOMY FEMORAL Left 01/21/2024   Procedure: LEFT COMMON, SUPERFICIAL FEMORAL AND PROFUNDA FEMORIS ENDARTERECTOMY WITH BOVINE PERICARDIAL PATCH ANGIOPLASTY;  Surgeon: Celso College, MD;  Location: [facility];  Service: Vascular;  Laterality: Left;   ESOPHAGOGASTRODUODENOSCOPY (EGD) WITH PROPOFOL  N/A 07/26/2020   Procedure: ESOPHAGOGASTRODUODENOSCOPY (EGD) WITH PROPOFOL ;  Surgeon: Marnee Sink, MD;  Location: [facility];  Service: Endoscopy;  Laterality: N/A;  Diabetic - oral meds   INSERTION OF ILIAC STENT Left 01/21/2024   Procedure: INSERTION, STENT, ARTERY, ILIAC;  Surgeon: Celso College, MD;  Location: [facility];  Service: Vascular;  Laterality: Left;  AND SFA STENTS   LOWER EXTREMITY ANGIOGRAPHY Left 09/29/2018   Procedure: LOWER EXTREMITY ANGIOGRAPHY;  Surgeon: Celso College, MD;  Location: [facility];  Service: Cardiovascular;  Laterality: Left;   LOWER EXTREMITY ANGIOGRAPHY Left 07/29/2023   Procedure: Lower Extremity Angiography;  Surgeon: Celso College, MD;  Location: [facility];  Service: Cardiovascular;  Laterality: Left;   LOWER EXTREMITY ANGIOGRAPHY Right 12/20/2023   Procedure: Lower Extremity Angiography;  Surgeon: Celso College, MD;  Location: [facility];  Service: Cardiovascular;  Laterality: Right;   LOWER EXTREMITY ANGIOGRAPHY Left 01/17/2024   Procedure: Lower Extremity Angiography;  Surgeon: Celso College, MD;  Location: [facility];  Service: Cardiovascular;  Laterality: Left;   LUNG BIOPSY  12/30/2011   has lung spots   PACEMAKER IMPLANT  07/14/2021   PACEMAKER LEADLESS INSERTION N/A 07/14/2021   Procedure: PACEMAKER LEADLESS INSERTION;  Surgeon: Percival Brace, MD;  Location: [facility];  Service: Cardiovascular;  Laterality: N/A;   PERIPHERAL VASCULAR CATHETERIZATION Left 06/01/2016   Procedure: Lower Extremity Angiography;  Surgeon: Celso College, MD;  Location: [facility];  Service: Cardiovascular;  Laterality: Left;   PERIPHERAL VASCULAR CATHETERIZATION N/A 06/01/2016   Procedure: Abdominal Aortogram w/Lower Extremity;  Surgeon: Celso College, MD;  Location: [facility];  Service: Cardiovascular;  Laterality: N/A;   PERIPHERAL VASCULAR CATHETERIZATION  06/01/2016   Procedure: Lower Extremity Intervention;  Surgeon: Celso College, MD;  Location: [facility];  Service: Cardiovascular;;   PERIPHERAL VASCULAR CATHETERIZATION Right 06/08/2016   Procedure: Lower Extremity Angiography;  Surgeon: Celso College, MD;  Location: [facility];  Service: Cardiovascular;  Laterality: Right;   PERIPHERAL VASCULAR CATHETERIZATION  06/08/2016   Procedure: Lower Extremity Intervention;  Surgeon: Celso College, MD;  Location: [facility];  Service: Cardiovascular;;   SUBMANDIBULAR GLAND EXCISION Left 12/06/2020   Procedure: EXCISION SUBMANDIBULAR GLAND;  Surgeon: Lesly Raspberry, MD;  Location: [facility];  Service: ENT;  Laterality: Left;  needs to be first case Diabetic -  diet controlled   TEMPORARY PACEMAKER N/A 07/11/2021   Procedure: TEMPORARY PACEMAKER;  Surgeon: Percival Brace, MD;  Location: [facility];  Service: Cardiovascular;  Laterality: N/A;    Medications Prior to Admission  Medication Sig Dispense Refill Last Dose/Taking   albuterol  (VENTOLIN  HFA) 108 (90 Base) MCG/ACT inhaler INHALE 2 PUFFS BY MOUTH EVERY 6 HOURS AS NEEDED FOR WHEEZING OR SHORTNESS OF BREATH 3 each 5 01/17/2024 Morning   ALPRAZolam  (XANAX ) 0.5 MG tablet Take 1 tablet (0.5 mg total) by mouth 2 (two) times daily as needed for anxiety. 60 tablet 2 01/17/2024 Morning   aspirin  EC 81 MG tablet Take 1 tablet (81 mg total) by mouth daily. Swallow whole. 150 tablet 2 01/17/2024 Morning   atorvastatin  (LIPITOR) 10 MG tablet Take 1 tablet (10 mg total) by mouth daily. 30 tablet 11 01/17/2024 Morning   calcium  carbonate (OSCAL) 1500 (600 Ca) MG TABS tablet Take 600 mg of elemental calcium  by mouth 2 (two) times daily with a meal.   01/17/2024 Morning   clopidogrel  (PLAVIX ) 75 MG tablet Take 1 tablet (75 mg total) by mouth daily. 90 tablet 3 01/17/2024 Morning   cyclobenzaprine  (FLEXERIL ) 10 MG tablet Take 1 tablet (10 mg total) by mouth at bedtime. Take one tab po qhs for back spasm prn only 30 tablet 3 Past Month   escitalopram  (LEXAPRO )  10 MG tablet Take 1 tablet (10 mg total) by mouth daily. 90 tablet 1 01/17/2024 Morning   ferrous sulfate  325 (65 FE) MG tablet Take 325 mg by mouth 2 (two) times daily with a meal.   01/17/2024 Morning   furosemide  (LASIX ) 20 MG tablet Take 1 tablet (20 mg total) by mouth daily as needed. 30 tablet 5 01/16/2024   gabapentin  (NEURONTIN ) 300 MG capsule TAKE 1 CAPSULE BY MOUTH THREE TIMES DAILY 90 capsule 0 01/17/2024 Morning   insulin  glargine, 1 Unit Dial , (TOUJEO ) 300 UNIT/ML Solostar Pen Inject 10 Units into the skin daily. 1.5 mL 11 01/17/2024 Morning   ipratropium-albuterol  (DUONEB) 0.5-2.5 (3) MG/3ML SOLN USE 1 AMPULE IN NEBULIZER EVERY 4 HOURS AS NEEDED 360 mL 0  01/17/2024 Morning   lisinopril -hydrochlorothiazide  (ZESTORETIC ) 20-25 MG tablet Take 1 tablet by mouth daily. 90 tablet 3 01/17/2024 Morning   metoprolol  succinate (TOPROL -XL) 25 MG 24 hr tablet Take 1 tablet (25 mg total) by mouth daily. 90 tablet 3 01/17/2024 Morning   mometasone -formoterol  (DULERA ) 200-5 MCG/ACT AERO Inhale 2 puffs by mouth twice daily 13 g 12 01/17/2024 Morning   potassium chloride  (KLOR-CON ) 10 MEQ tablet TAKE 1 TABLET BY MOUTH EVERY OTHER DAY 45 tablet 0 01/16/2024   SPIRIVA  HANDIHALER 18 MCG inhalation capsule INHALE THE CONTENTS OF 1 CAPSULES BY MOUTH ONCE DAILY - DO NOT SWALLOW CAPSULES 90 capsule 3 01/17/2024 Morning   vitamin B-12 (CYANOCOBALAMIN ) 100 MCG tablet Take 100 mcg by mouth daily.   01/17/2024 Morning   VITAMIN D , CHOLECALCIFEROL , PO Take 1 tablet by mouth in the morning.   01/17/2024 Morning   ACCU-CHEK GUIDE TEST test strip USE TO TEST BLOOD SUGAR 5 TIMES DAILY 450 each 0    Accu-Chek Softclix Lancets lancets Use 1 lancet 3 times daily to check glucose for diabetes 300 each 1    Blood Glucose Monitoring Suppl (ACCU-CHEK GUIDE) w/Device KIT Use as directed Dx e11.65 1 kit 0    Continuous Blood Gluc Receiver (DEXCOM G7 RECEIVER) DEVI Use one as directed for uncontrolled dm 1 each 0    Continuous Glucose Sensor (DEXCOM G7 SENSOR) MISC USE AS DIRECTED. CHANGE EVERY 10 DAYS. 3 each 0    Ensifentrine  (OHTUVAYRE ) 3 MG/2.5ML SUSP Inhale 3 mg into the lungs in the morning and at bedtime. (Patient not taking: Reported on 01/17/2024) 150 mL 11 Not Taking   famotidine  (PEPCID ) 20 MG tablet Take 1 tablet (20 mg total) by mouth daily for 14 days. (Patient taking differently: Take 20 mg by mouth daily as needed for heartburn.) 14 tablet 0    Insulin  Pen Needle (RELION PEN NEEDLES) 31G X 6 MM MISC USE 1 PEN NEEDLE WITH INSULIN  PEN ONCE DAILY FOR DIABETES 50 each 3    metFORMIN  (GLUCOPHAGE -XR) 500 MG 24 hr tablet TAKE 2 TABLETS BY MOUTH ONCE DAILY WITH BREAKFAST 180 tablet 0     methylPREDNISolone  (MEDROL  DOSEPAK) 4 MG TBPK tablet       OXYGEN  Inhale 4 L into the lungs. PT USES [facility] FOR OXYGEN       Social History   Socioeconomic History   Marital status: Widowed    Spouse name: Not on file   Number of children: Not on file   Years of education: Not on file   Highest education level: Not on file  Occupational History   Not on file  Tobacco Use   Smoking status: Former    Current packs/day: 0.00    Average packs/day: 1 pack/day for  37.0 years (37.0 ttl pk-yrs)    Types: Cigarettes    Start date: 02/06/1973    Quit date: 02/06/2010    Years since quitting: 13.9   Smokeless tobacco: Former    Types: Snuff  Vaping Use   Vaping status: Never Used  Substance and Sexual Activity   Alcohol use: Not Currently    Alcohol/week: 5.0 standard drinks of alcohol    Types: 5 Cans of beer per week    Comment: /h x of alcohol abuse -stopped 2012- now drinks 5 beer per week   Drug use: Not Currently    Types: Marijuana, Crack cocaine, Cocaine    Comment: hx of cocaine use- last use 2015; last use marijuana6/22/19,   Sexual activity: Yes  Other Topics Concern   Not on file  Social History Narrative   Lives with Significant Other x 43 years   Social Drivers of Corporate investment banker Strain: Not on file  Food Insecurity: No Food Insecurity (01/17/2024)   Hunger Vital Sign    Worried About Running Out of Food in the Last Year: Never true    Ran Out of Food in the Last Year: Never true  Transportation Needs: No Transportation Needs (01/17/2024)   PRAPARE - Administrator, Civil Service (Medical): No    Lack of Transportation (Non-Medical): No  Physical Activity: Not on file  Stress: Not on file  Social Connections: Socially Integrated (01/17/2024)   Social Connection and Isolation Panel    Frequency of Communication with Friends and Family: More than three times a week    Frequency of Social Gatherings with Friends and Family: More than three  times a week    Attends Religious Services: 1 to 4 times per year    Active Member of Clubs or Organizations: No    Attends Banker Meetings: 1 to 4 times per year    Marital Status: Married  Catering manager Violence: Not At Risk (01/17/2024)   Humiliation, Afraid, Rape, and Kick questionnaire    Fear of Current or Ex-Partner: No    Emotionally Abused: No    Physically Abused: No    Sexually Abused: No    Family History  Problem Relation Age of Onset   Diabetes Mother    Hypercholesterolemia Mother    Lung cancer Father    Diabetes Sister    Diabetes Sister    Hypertension Sister    Diabetes Maternal Grandmother    Diabetes Paternal Grandmother    Heart attack Brother    Coronary artery disease Brother    Vascular Disease Brother    Heart attack Brother    Breast cancer Neg Hx      Vitals:   02/01/24 0408 02/01/24 0736 02/01/24 0823 02/01/24 0851  BP: 128/74  (!) 162/78 (!) 162/78  Pulse: 71  86 86  Resp: 20     Temp: 97.6 F (36.4 C)  98.7 F (37.1 C)   TempSrc:      SpO2: 100% (P) 97% 91%   Weight:      Height:        PHYSICAL EXAM General: chronically ill appearing elderly female, well nourished, in no acute distress. HEENT: Normocephalic and atraumatic. Neck: No JVD.   Lungs: Normal respiratory effort on 3L. Diminished breath sounds bilaterally. Heart: HRRR. Normal S1 and S2 without gallops or murmurs.  Abdomen: Non-distended appearing.  Msk: Normal strength and tone for age. Extremities: Warm and well perfused. No clubbing, cyanosis. No  edema.  Neuro: Alert and oriented X 3. Psych: Answers questions appropriately.   Labs: Basic Metabolic Panel: Recent Labs    01/30/24 0435  NA 137  K 4.6  CL 100  CO2 32  GLUCOSE 228*  BUN 22  CREATININE 0.69  CALCIUM  8.5*   Liver Function Tests: No results for input(s): AST, ALT, ALKPHOS, BILITOT, PROT, ALBUMIN in the last 72 hours.  No results for input(s): LIPASE, AMYLASE in  the last 72 hours. CBC: Recent Labs    01/30/24 0435  WBC 10.9*  HGB 9.4*  HCT 30.4*  MCV 94.1  PLT 324   Cardiac Enzymes: No results for input(s): CKTOTAL, CKMB, CKMBINDEX, TROPONINIHS in the last 72 hours.  BNP: No results for input(s): BNP in the last 72 hours.  D-Dimer: No results for input(s): DDIMER in the last 72 hours.  Hemoglobin A1C: No results for input(s): HGBA1C in the last 72 hours. Fasting Lipid Panel: No results for input(s): CHOL, HDL, LDLCALC, TRIG, CHOLHDL, LDLDIRECT in the last 72 hours. Thyroid  Function Tests: No results for input(s): TSH, T4TOTAL, T3FREE, THYROIDAB in the last 72 hours.  Invalid input(s): FREET3 Anemia Panel: No results for input(s): VITAMINB12, FOLATE, FERRITIN, TIBC, IRON , RETICCTPCT in the last 72 hours.   Radiology: ECHOCARDIOGRAM COMPLETE Result Date: 01/29/2024    ECHOCARDIOGRAM REPORT   Patient Name:   LORAH KALINA [facility] Date of Exam: 01/29/2024 Medical Rec #:  [id number]    Height:       59.0 in Accession #:    [id number]   Weight:       132.1 lb Date of Birth:  February 26, 1956    BSA:          1.546 m Patient Age:    68 years     BP:           118/77 mmHg Patient Gender: F            HR:           86 bpm. Exam Location:  [facility] Procedure: 2D Echo (Both Spectral and Color Flow Doppler were utilized during            procedure). Indications:     Elevated Troponin  History:         Patient has prior history of Echocardiogram examinations, most                  recent 06/15/2023.  Sonographer:     Clenton Czech RDCS, FASE Referring Phys:  [id number] Lanetta Pion Diagnosing Phys: Antonette Batters MD  Sonographer Comments: Technically challenging study due to limited acoustic windows and no apical window. Image acquisition challenging due to respiratory motion. IMPRESSIONS  1. Left ventricular ejection fraction, by estimation, is 60 to 65%. The left ventricle has normal function. The left ventricle has no  regional wall motion abnormalities. Left ventricular diastolic parameters are consistent with Grade I diastolic dysfunction (impaired relaxation).  2. Right ventricular systolic function is normal. The right ventricular size is normal.  3. The mitral valve is normal in structure. Trivial mitral valve regurgitation.  4. The aortic valve is normal in structure. Aortic valve regurgitation is not visualized. FINDINGS  Left Ventricle: Left ventricular ejection fraction, by estimation, is 60 to 65%. The left ventricle has normal function. The left ventricle has no regional wall motion abnormalities. Strain was performed and the global longitudinal strain is indeterminate. Global longitudinal strain performed but not reported based on interpreter judgement due to suboptimal tracking.  The left ventricular internal cavity size was normal in size. There is no left ventricular hypertrophy. Left ventricular diastolic parameters are consistent with Grade I diastolic dysfunction (impaired relaxation). Right Ventricle: The right ventricular size is normal. No increase in right ventricular wall thickness. Right ventricular systolic function is normal. Left Atrium: Left atrial size was normal in size. Right Atrium: Right atrial size was normal in size. Pericardium: There is no evidence of pericardial effusion. Mitral Valve: The mitral valve is normal in structure. Trivial mitral valve regurgitation. Tricuspid Valve: The tricuspid valve is normal in structure. Tricuspid valve regurgitation is trivial. Aortic Valve: The aortic valve is normal in structure. Aortic valve regurgitation is not visualized. Pulmonic Valve: The pulmonic valve was normal in structure. Pulmonic valve regurgitation is not visualized. Aorta: The ascending aorta was not well visualized. IAS/Shunts: No atrial level shunt detected by color flow Doppler. Additional Comments: 3D was performed not requiring image post processing on an independent workstation and was  indeterminate.  LEFT VENTRICLE PLAX 2D LVIDd:         4.10 cm   Diastology LVIDs:         2.80 cm   LV e' medial:    6.53 cm/s LV PW:         1.00 cm   LV E/e' medial:  12.0 LV IVS:        0.90 cm   LV e' lateral:   5.87 cm/s LVOT diam:     2.00 cm   LV E/e' lateral: 13.3 LV SV:         44 LV SV Index:   28 LVOT Area:     3.14 cm  LEFT ATRIUM           Index LA diam:      2.70 cm 1.75 cm/m LA Vol (A4C): 9.9 ml  6.37 ml/m  AORTIC VALVE             PULMONIC VALVE LVOT Vmax:   82.20 cm/s  PV Vmax:        1.00 m/s LVOT Vmean:  50.100 cm/s PV Peak grad:   4.0 mmHg LVOT VTI:    0.140 m     RVOT Peak grad: 2 mmHg  AORTA Ao Root diam: 3.00 cm MITRAL VALVE               TRICUSPID VALVE MV Area (PHT): 4.01 cm    TR Peak grad:   25.6 mmHg MV Decel Time: 189 msec    TR Vmax:        253.00 cm/s MV E velocity: 78.10 cm/s MV A velocity: 93.30 cm/s  SHUNTS MV E/A ratio:  0.84        Systemic VTI:  0.14 m                            Systemic Diam: 2.00 cm Antonette Batters MD Electronically signed by Antonette Batters MD Signature Date/Time: 01/29/2024/4:01:12 PM    Final    DG Chest 2 View Result Date: 01/29/2024 CLINICAL DATA:  Short of breath, history of lung cancer status post radiation therapy EXAM: CHEST - 2 VIEW COMPARISON:  01/27/2024 FINDINGS: Frontal and lateral views of the chest demonstrate a stable cardiac silhouette. Areas of consolidation and scarring at the right lung base most consistent with post radiation changes in this patient with a history of lung cancer. No acute airspace disease, effusion, or pneumothorax. No acute  bony abnormalities. IMPRESSION: 1. Stable post therapeutic changes at the right lung base. 2. No acute intrathoracic process. Electronically Signed   By: Bobbye Burrow M.D.   On: 01/29/2024 13:42   US  ARTERIAL ABI (SCREENING LOWER EXTREMITY) Result Date: 01/29/2024 CLINICAL DATA:  Peripheral vascular disease and status post recent left femoral endarterectomy with mechanical thrombectomy  of left SFA a, left SFA and popliteal stent placement, tibial peroneal and posterior tibial angioplasty and left external iliac artery stent placement. EXAM: NONINVASIVE PHYSIOLOGIC VASCULAR STUDY OF BILATERAL LOWER EXTREMITIES TECHNIQUE: Evaluation of both lower extremities were performed at rest, including calculation of ankle-brachial indices with single level Doppler, pressure and pulse volume recording. COMPARISON:  Report from a prior study on 12/01/2023 FINDINGS: Right ABI:  0.75.  Previously 0.73 Left ABI:  0.88.  Previously 0.86 Right Lower Extremity:  Monophasic distal waveforms Left Lower Extremity:  Monophasic distal wave 0.5-0.79 Moderate PAD IMPRESSION: Resting left ABI of 0.88 after recent left lower extremity surgery and endovascular intervention. Stable resting right ABI of 0.75. Electronically Signed   By: Erica Hau M.D.   On: 01/29/2024 10:22   CT Angio Chest Pulmonary Embolism (PE) W or WO Contrast Result Date: 01/28/2024 CLINICAL DATA:  Wheezing EXAM: CT ANGIOGRAPHY CHEST WITH CONTRAST TECHNIQUE: Multidetector CT imaging of the chest was performed using the standard protocol during bolus administration of intravenous contrast. Multiplanar CT image reconstructions and MIPs were obtained to evaluate the vascular anatomy. RADIATION DOSE REDUCTION: This exam was performed according to the departmental dose-optimization program which includes automated exposure control, adjustment of the mA and/or kV according to patient size and/or use of iterative reconstruction technique. CONTRAST:  OMNIPAQUE  IOHEXOL  350 MG/ML SOLN COMPARISON:  Chest x-ray from the previous day.  CT from 09/29/2023 FINDINGS: Cardiovascular: Thoracic aorta and its branches demonstrate atherosclerotic calcification without aneurysmal dilatation. Coronary calcifications are noted. No cardiac enlargement is seen. The pulmonary artery shows a normal branching pattern bilaterally. No intraluminal filling defect to suggest  pulmonary embolism is seen. Mediastinum/Nodes: Thoracic inlet is within normal limits. The esophagus as visualized is unremarkable. No hilar or mediastinal adenopathy is noted. Lungs/Pleura: Lungs are well aerated bilaterally. Diffuse emphysematous changes are seen. Apical pleural and parenchymal scarring noted in the right apex. Diffuse infiltrate is seen within the right middle and lower lobes. These changes are similar to that seen on recent chest x-ray as well as prior CT and felt to be again related to prior radiation therapy. Stable left upper lobe nodule is noted on image number 38 of series 5 measuring 5 mm. Tiny right upper lobe nodule is noted stable in appearance. Upper Abdomen: Visualized upper abdomen is within normal limits. Musculoskeletal: Postsurgical changes are noted in the right chest wall stable from the prior exam. No acute bony abnormality is noted. Review of the MIP images confirms the above findings. IMPRESSION: No evidence of pulmonary emboli. Right lower lobe infiltrate of a chronic nature. Related to prior radiation Stable bilateral pulmonary nodules dating back to August of 2024. Follow-up CT in 6 months is recommended to assess for stability. Aortic Atherosclerosis (ICD10-I70.0) and Emphysema (ICD10-J43.9). Electronically Signed   By: Violeta Grey M.D.   On: 01/28/2024 03:18   DG Chest 1 View Result Date: 01/27/2024 CLINICAL DATA:  Wheezing. EXAM: CHEST  1 VIEW COMPARISON:  Same day chest radiograph dated 01/27/2024 at 12:24 p.m. FINDINGS: Stable cardiomediastinal silhouette. Similar right mid lung density, most compatible with radiation fibrosis as noted on the prior CT. Slightly increased right  basilar opacity could reflect atelectasis, however, a small layering pleural effusion can not be excluded. Left lung is clear. No pneumothorax. Visualized osseous structures are unchanged. IMPRESSION: 1. Slightly increased right basilar opacity could reflect atelectasis, however, a small  layering pleural effusion can not be excluded. 2. Similar right mid lung density, most compatible with radiation fibrosis as noted on the prior CT. Electronically Signed   By: Mannie Seek M.D.   On: 01/27/2024 20:13   DG Chest 2 View Result Date: 01/27/2024 CLINICAL DATA:  Bloody sputum. EXAM: CHEST - 2 VIEW COMPARISON:  September 29, 2023.  June 15, 2023. FINDINGS: The heart size and mediastinal contours are within normal limits. Left lung is clear. Stable right apical scarring is noted. Stable right midlung and basilar density is noted most consistent with radiation fibrosis as noted on prior CT scan. No acute osseous abnormality is noted. IMPRESSION: Stable chronic findings noted in right lung. No acute abnormality seen. Electronically Signed   By: Rosalene Colon M.D.   On: 01/27/2024 13:40   DG C-Arm 1-60 Min-No Report Result Date: 01/21/2024 Fluoroscopy was utilized by the requesting physician.  No radiographic interpretation.   DG C-Arm 1-60 Min-No Report Result Date: 01/21/2024 Fluoroscopy was utilized by the requesting physician.  No radiographic interpretation.   DG C-Arm 1-60 Min-No Report Result Date: 01/21/2024 Fluoroscopy was utilized by the requesting physician.  No radiographic interpretation.   PERIPHERAL VASCULAR CATHETERIZATION Result Date: 01/17/2024 See surgical note for result.  VAS US  ABI WITH/WO TBI Result Date: 01/06/2024  LOWER EXTREMITY DOPPLER STUDY Patient Name:  ELICIA LUI  Date of Exam:   01/05/2024 Medical Rec #: [id number]     Accession #:    [id number] Date of Birth: [birth year]     Patient Gender: F Patient Age:   49 years Exam Location:  [facility] Procedure:      VAS US  ABI WITH/WO TBI Referring Phys: Mikki Alexander --------------------------------------------------------------------------------  Indications: Claudication, and peripheral artery disease. Other Factors: Left SFA stents are known to be occluded.                Patient complains of  constant numbness in left foot.                 07/29/2023: Aortogram and selective Left Lower                ExtremityAngiogram. Mechanical thrombectomy of the Left SFA and                Popliteal Artery with the [place] Rex device. Stent placement to the                Proximal Left SFA with 6 mm diameter by 5 cm length Viabahn                stent. Stent placement to the Left Popliteal Artery above the                knee with 6 mm diameter by 5 cm length Viabahn stent.                 12/20/2023: Aortogram and Selective Right Lower                ExtremityAngiogram. PTA of the Right SFA with 4 mm diameter by 22                cm length Lutonix drug coated angioplasty balloon. Stent  placement to the Right SFA with 6 mm by 20 cm length Life stent.  Vascular Interventions: 06/01/16: Left distal SFA/popliteal arteries PTAs with                         SFA stent x2;                         06/08/16: Left SFA PTA;                         09/29/18: Left SFA stent x2;. Comparison Study: 12/01/2023 Performing Technologist: Tonie Franks RVS  Examination Guidelines: A complete evaluation includes at minimum, Doppler waveform signals and systolic blood pressure reading at the level of bilateral brachial, anterior tibial, and posterior tibial arteries, when vessel segments are accessible. Bilateral testing is considered an integral part of a complete examination. Photoelectric Plethysmograph (PPG) waveforms and toe systolic pressure readings are included as required and additional duplex testing as needed. Limited examinations for reoccurring indications may be performed as noted.  ABI Findings: +---------+------------------+-----+--------+--------+ Right    Rt Pressure (mmHg)IndexWaveformComment  +---------+------------------+-----+--------+--------+ Brachial 115                                     +---------+------------------+-----+--------+--------+ ATA      100               0.87 biphasic          +---------+------------------+-----+--------+--------+ PTA      105               0.91 biphasic         +---------+------------------+-----+--------+--------+ Great Toe108               0.94 Normal           +---------+------------------+-----+--------+--------+ +---------+------------------+-----+-------------------+-------+ Left     Lt Pressure (mmHg)IndexWaveform           Comment +---------+------------------+-----+-------------------+-------+ Brachial 114                                               +---------+------------------+-----+-------------------+-------+ ATA      43                0.37 dampened monophasic        +---------+------------------+-----+-------------------+-------+ PTA                             Non pulsatile flow         +---------+------------------+-----+-------------------+-------+ PERO     46                0.40 dampened monophasic        +---------+------------------+-----+-------------------+-------+ Great Toe32                0.28                            +---------+------------------+-----+-------------------+-------+ +-------+-----------+-----------+------------+------------+ ABI/TBIToday's ABIToday's TBIPrevious ABIPrevious TBI +-------+-----------+-----------+------------+------------+ Right  .91        .94        .73         .85          +-------+-----------+-----------+------------+------------+  Left   .40        .28        .86         .94          +-------+-----------+-----------+------------+------------+ Right ABIs and TBIs appear increased compared to prior study on 12/01/2023. Left ABIs and TBIs appear decreased compared to prior study on 12/01/2023.  Summary: Right: Resting right ankle-brachial index indicates mild right lower extremity arterial disease. The right toe-brachial index is normal. Left: Resting left ankle-brachial index indicates severe left lower extremity arterial disease. The  left toe-brachial index is abnormal. *See table(s) above for measurements and observations.  Electronically signed by Mikki Alexander MD on 01/06/2024 at 8:12:02 AM.    Final     ECHO ordered.  06/15/2023  1. Left ventricular ejection fraction, by estimation, is >55%. The left ventricle has normal function. Left ventricular endocardial border not optimally defined to evaluate regional wall motion. Left ventricular diastolic parameters are consistent with Grade I diastolic dysfunction (impaired relaxation).   2. Right ventricular systolic function is normal. The right ventricular size is normal. Tricuspid regurgitation signal is inadequate for assessing PA pressure.   3. The mitral valve was not well visualized. Trivial mitral valve regurgitation.   4. Tricuspid valve regurgitation is mild to moderate.  5. The aortic valve was not well visualized. Aortic valve regurgitation is not visualized. No aortic stenosis is present.   6. The inferior vena cava is normal in size with greater than 50% respiratory variability, suggesting right atrial pressure of 3 mmHg.   TELEMETRY reviewed by me 02/01/2024: sinus rhythm, rate 70s  EKG reviewed by me: sinus rhythm, RBBB rate 91 bpm.  EKG 06/12: NSR with RBBB rate 99 bpm (unchanged from prior EKG)  Data reviewed by me 02/01/2024: last 24h vitals tele labs imaging I/O vascular surgery notes, Op note, hospitalist progress notes, pulmonology notes  Principal Problem:   Ischemic leg Active Problems:   Personal history of tobacco use, presenting hazards to health   Cancer of lower lobe of right lung (HCC)   Elevated troponin   Acute on chronic respiratory failure with hypoxia and hypercapnia (HCC)   Atherosclerosis of artery of extremity with rest pain (HCC)   S/p femoral endarterectomy 01/21/2024   Chronic heart failure with preserved ejection fraction (HFpEF) (HCC)   SSS (sick sinus syndrome) s/p leadless pacemaker [facility])    ASSESSMENT AND PLAN:  HADLEE BURBACK is  a 68 y.o. female  with a past medical history of chronic HFpEF, SSS s/p micra (06/2021), hypertension, hyperlipidemia, aortic atherosclerosis, IDDM, PVD, COPD/asthma (baseline 2L O2), OSA, tobacco use who presented for outpatient procedure on 01/17/2024 underwent lower extremity angiogram on 06/02 and found that patient will need a left femoral endarterectomy with illiac stents and SFA intervention. Cardiology was consulted for preoperative cardiac evaluation (06/02), consulted for elevated troponins (06/13).  # Acute on chronic respiratory failure with hypoxia and hypercapnia # COPD exacerbation Patient reports last night (06/13) she got out of bed to go to bedside commode and her O2 accidentally disconnected. Patient states when she had no O2 she couldn't breath and felt very distressed. Patient states she requires 3L O2 continuously. Patient states she had no chest pain/pressure doing this event. Patient required BiPAP after this respiratory decompensation. Patient now on 3L this AM and reports breathing is much improved. -Recommend continuing breathing treatments for COPD.  -Encouraged use of spirometry.  -Pulmonology consulted, appreciate recommendations.  # Demand ischemia  # Hypertension #  Hyperlipidemia # Chronic HFpEF # SSS s/p micra (06/2021) Patient with PAD with rest pain LLE underwent left lower extremity angiogram that demonstrated 70% stenosis of left common femoral artery on 06/02. Patient requiring left femoral endarterectomy with illiac stents and SFA intervention. Echo from 05/2023 with pEF and low risk. EKG from (06/03) with sinus rhythm, RBBB is unchanged from prior EKG, no acute ischemic changes. During respiratory distress event tropnins were drawn and found to be elevated. Trops elevated and flat 50 > 130 > 260 > 230. BNP within normal limits. EKG on 06/12 with NSR with RBBB rate 99 bpm without acute ischemic changes. (unchanged from prior EKG). Echo this admission with pEF, no  RWMA (unchanged from prior echo).  -Continue ASA 81 mg, atorvastatin  10 mg daily. -Continue metoprolol  succinate to 50 mg daily. -Continue losartan 25 mg daily. May uptitrate if needed. -Plan to optimize GDMT if BP and renal function allows.  -Elevated and flat trops in setting of acute on chronic respiratory failure and likely COPD exacerbation is most consistent with demand/mismatch ischemia and not ACS.  -Due to patients multiple comorbidties and risk factors for coronary artery disease would recommend outpatient cardiac stress testing. -Will continue to follow patient until discharge.   #Preop Cardiac Evaluation # PAD with Left common femoral artery stenosis s/p left femoral endarterectomy POD#7 s/p left femoral endarterectomy, iliac stent, SFA and tibial intervention.  -Management per vascular surgery.   No further cardiac recommendations at this time. Cardiology will sign off. Please haiku with questions or re-engage if needed.   This patient's plan of care was discussed and created with Dr. Beau Bound and he is in agreement.  Signed: Creighton Doffing, PA-C  02/01/2024, 10:27 AM [facility] Cardiology

## 2024-02-01 NOTE — Progress Notes (Signed)
 Physical Therapy Treatment Patient Details Name: Angela Jensen MRN: [id number] DOB: Feb 17, 1956 Today's Date: 02/01/2024   History of Present Illness Angela Jensen is a 68yoF who comes to [facility] on 6/2 due to ischemic limb, now s/p left femoral endarterectomy (6/2 and 6/6). Pt showed elevated troponin on 6/13, cardiology following attributes to demand ischemia. PMH: heart failure, SSS s/p micra, HTN, aortic atherosclerosis, IDDM, PVD, COPD on 2L baseline, OSA, tobacco use.    PT Comments  Pt finishing up with her bath upon entry, agreeable to session. On 3L seated at sink, SpO2 84-85%; increased flow rate to 6L for PT session, sats 87-93% during session resting to walking. Pt able to AMB 3x 222ft in session, has improved post walking sats, improved recovery time, and improved gait speed. Pt up in chair at end of session, flow rate adjusted back down.    If plan is discharge home, recommend the following: Assist for transportation;Help with stairs or ramp for entrance;Assistance with cooking/housework   Can travel by private vehicle        Equipment Recommendations  None recommended by PT    Recommendations for Other Services       Precautions / Restrictions Precautions Precautions: Fall Recall of Precautions/Restrictions: Intact Precaution/Restrictions Comments: watch HR, O2 Restrictions Weight Bearing Restrictions Per Provider Order: No     Mobility  Bed Mobility                    Transfers Overall transfer level: Needs assistance Equipment used: Rolling walker (2 wheels) Transfers: Sit to/from Stand Sit to Stand: Supervision                Ambulation/Gait   Gait Distance (Feet): 200 Feet (3x221ft today, seated recovery) Assistive device: Rolling walker (2 wheels) Gait Pattern/deviations: Step-through pattern, WFL(Within Functional Limits) Gait velocity: 0.38m/s today (0.28m/s 6/16)     General Gait Details: 3x267ft with 2 minute seated recovery  6L/min both  bouts and resting interval. (89% SpO2 throughout)   Stairs             Wheelchair Mobility     Tilt Bed    Modified Rankin (Stroke Patients Only)       Balance                                            Communication    Cognition Arousal: Alert Behavior During Therapy: WFL for tasks assessed/performed   PT - Cognitive impairments: No apparent impairments                                Cueing    Exercises      General Comments        Pertinent Vitals/Pain Pain Assessment Pain Assessment: No/denies pain    Home Living                          Prior Function            PT Goals (current goals can now be found in the care plan section) Acute Rehab PT Goals Patient Stated Goal: return home PT Goal Formulation: With patient Time For Goal Achievement: 02/05/24 Potential to Achieve Goals: Good Progress towards PT goals: Progressing toward goals    Frequency    Min  2X/week      PT Plan      Co-evaluation              AM-PAC PT 6 Clicks Mobility   Outcome Measure  Help needed turning from your back to your side while in a flat bed without using bedrails?: None Help needed moving from lying on your back to sitting on the side of a flat bed without using bedrails?: None Help needed moving to and from a bed to a chair (including a wheelchair)?: None Help needed standing up from a chair using your arms (e.g., wheelchair or bedside chair)?: None Help needed to walk in hospital room?: None Help needed climbing 3-5 steps with a railing? : A Little 6 Click Score: 23    End of Session Equipment Utilized During Treatment: Oxygen  Activity Tolerance: Patient tolerated treatment well;No increased pain;Patient limited by fatigue Patient left: with call bell/phone within reach;with nursing/sitter in room;in chair Nurse Communication: Mobility status PT Visit Diagnosis: Pain;Muscle weakness (generalized)  (M62.81);Difficulty in walking, not elsewhere classified (R26.2) Pain - Right/Left: Left Pain - part of body: Leg     Time: 1005-1026 PT Time Calculation (min) (ACUTE ONLY): 21 min  Charges:    $Therapeutic Activity: 23-37 mins PT General Charges $$ ACUTE PT VISIT: 1 Visit                    12:52 PM, 02/01/24 Angela Jensen, PT, DPT Physical Therapist - [facility]  [phone] (ASCOM)    Angela Jensen C 02/01/2024, 12:47 PM

## 2024-02-01 NOTE — Plan of Care (Signed)
   Problem: Education: Goal: Knowledge of General Education information will improve Description: Including pain rating scale, medication(s)/side effects and non-pharmacologic comfort measures Outcome: Progressing   Problem: Clinical Measurements: Goal: Respiratory complications will improve Outcome: Progressing   Problem: Activity: Goal: Risk for activity intolerance will decrease Outcome: Progressing

## 2024-02-01 NOTE — Progress Notes (Signed)
 Progress Note    02/01/2024 10:25 AM 11 Days Post-Op  Subjective:  Angela Jensen is a 68 yo female now POD #11 from the following:  PROCEDURE: 1.   Left common femoral, profunda femoris, and superficial femoral artery endarterectomies and patch angioplasty 2.   Stent placement to the left external iliac artery with 8 mm diameter by 7.5 cm length Viabahn stent 3.   Mechanical thrombectomy to the left SFA and popliteal arteries with the 8 [place] Rex device 4.   Stent placement to proximal left SFA with 7 mm diameter by 10 cm length Viabahn stent 5.  Stent placement to the left most distal SFA and popliteal artery with 6 mm diameter by 15 cm length Viabahn stent 6.  Angioplasty of the left tibioperoneal trunk and posterior tibial arteries with 2.5 mm diameter angioplasty balloon proximally and then the entirety of the left posterior tibial artery with 2 mm diameter angioplasty: 7.  Mechanical thrombectomy of the left tibioperoneal trunk and posterior tibial arteries with the penumbra 6 bolt device 8.  Instillation of 4 mg of tPA in the left posterior tibial artery distally for residual thrombus after above procedures  Vitals:   02/01/24 0823 02/01/24 0851  BP: (!) 162/78 (!) 162/78  Pulse: 86 86  Resp:    Temp: 98.7 F (37.1 C)   SpO2: 91%    Physical Exam: Cardiac:  RRR, normal S1 and S2.  No rubs clicks gallops or murmurs noted. Lungs: Clear throughout on auscultation.  Normal labored breathing.  No rales rhonchi or wheezing this morning. Continues to have scant amount of blood tinged sputum.  Incisions: Left groin incision with dressing clean dry and intact.  No hematoma seroma or infection to note. Extremities: Bilateral lower extremities today warm to touch with palpable pulses. Abdomen: Positive bowel sounds throughout, soft, nontender and nondistended. Neurologic: Alert and oriented x 3, follows all commands and answers all questions appropriately.  CBC    Component  Value Date/Time   WBC 10.9 (H) 01/30/2024 0435   RBC 3.23 (L) 01/30/2024 0435   HGB 9.4 (L) 01/30/2024 0435   HGB 12.7 10/08/2023 1004   HGB 11.6 10/04/2023 1047   HCT 30.4 (L) 01/30/2024 0435   HCT 37.6 10/04/2023 1047   PLT 324 01/30/2024 0435   PLT 264 10/08/2023 1004   PLT 236 10/04/2023 1047   MCV 94.1 01/30/2024 0435   MCV 88 10/04/2023 1047   MCV 90 04/17/2014 1055   MCH 29.1 01/30/2024 0435   MCHC 30.9 01/30/2024 0435   RDW 14.3 01/30/2024 0435   RDW 12.9 10/04/2023 1047   RDW 13.6 04/17/2014 1055   LYMPHSABS 0.7 01/27/2024 2205   LYMPHSABS 0.9 10/04/2023 1047   LYMPHSABS 1.1 04/17/2014 1055   MONOABS 0.9 01/27/2024 2205   MONOABS 0.3 04/17/2014 1055   EOSABS 0.0 01/27/2024 2205   EOSABS 0.1 10/04/2023 1047   EOSABS 0.2 04/17/2014 1055   BASOSABS 0.0 01/27/2024 2205   BASOSABS 0.0 10/04/2023 1047   BASOSABS 0.1 04/17/2014 1055    BMET    Component Value Date/Time   NA 137 01/30/2024 0435   NA 144 10/04/2023 1047   NA 141 04/17/2014 1055   K 4.6 01/30/2024 0435   K 3.9 04/17/2014 1055   CL 100 01/30/2024 0435   CL 102 04/17/2014 1055   CO2 32 01/30/2024 0435   CO2 30 04/17/2014 1055   GLUCOSE 228 (H) 01/30/2024 0435   GLUCOSE 201 (H) 04/17/2014 1055   BUN  22 01/30/2024 0435   BUN 12 10/04/2023 1047   BUN 10 04/17/2014 1055   CREATININE 0.69 01/30/2024 0435   CREATININE 0.84 07/06/2023 1016   CREATININE 0.90 04/17/2014 1055   CALCIUM  8.5 (L) 01/30/2024 0435   CALCIUM  9.1 04/17/2014 1055   GFRNONAA >60 01/30/2024 0435   GFRNONAA >60 07/06/2023 1016   GFRNONAA >60 04/17/2014 1055   GFRAA >60 05/05/2020 0558   GFRAA >60 04/17/2014 1055    INR    Component Value Date/Time   INR 1.1 01/17/2024 1208    No intake or output data in the 24 hours ending 02/01/24 1025   Assessment/Plan:  68 y.o. female is s/p See Above 11 Days Post-Op   PLAN  Work extensively with PT and OT today in preparation for discharge tomorrow. Plan is to discharge to home  tomorrow now that she is back to her baseline 3 L nasal cannula oxygen  at home. Pulmonary to give another dose of inhaled TXA today.  DVT prophylaxis: ASA 81 mg daily, Plavix  75 mg daily   Arnella Pralle R Jenniferlynn Saad [facility] 02/01/2024 10:25 AM

## 2024-02-02 ENCOUNTER — Other Ambulatory Visit: Payer: Self-pay

## 2024-02-02 LAB — GLUCOSE, CAPILLARY: Glucose-Capillary: 358 mg/dL — ABNORMAL HIGH (ref 70–99)

## 2024-02-02 NOTE — Inpatient Diabetes Management (Signed)
 Inpatient Diabetes Program Recommendations  AACE/ADA: New Consensus Statement on Inpatient Glycemic Control  Target Ranges:  Prepandial:   less than 140 mg/dL      Peak postprandial:   less than 180 mg/dL (1-2 hours)      Critically ill patients:  140 - 180 mg/dL    Latest Reference Range & Units 02/01/24 08:23 02/01/24 12:29 02/01/24 16:57 02/01/24 19:44 02/02/24 09:02  Glucose-Capillary 70 - 99 mg/dL 086 (H) 578 (H) 469 (H) 255 (H) 358 (H)   Review of Glycemic Control  Outpatient DM medications: Toujeo  10 units daily, Metformin  XR 1000 mg QAM Current orders for Inpatient glycemic control: Novolog  0-20 units AC&HS  Inpatient Diabetes Program Recommendations:    Insulin : Last dose of Prednisone  given on 6/17. CBG 358 mg/dl today at 6:29 (question if post prandial).  Please consider ordering one time Semglee  6 units x1 now.  Thanks, Beacher Limerick, RN, MSN, CDCES Diabetes Coordinator Inpatient Diabetes Program [phone] (Team Pager from 8am to 5pm)

## 2024-02-02 NOTE — Progress Notes (Signed)
 PULMONOLOGY         Date: 02/02/2024,   MRN# [id number] Angela Jensen 1956-06-05     AdmissionWeight: 54 kg                 CurrentWeight: 59.9 kg  Referring provider: Dr Melvinia Stager   CHIEF COMPLAINT:   Non-massive hemoptysis   HISTORY OF PRESENT ILLNESS   68 year old lady with a history of asthma and COPD overlap coronary artery disease on antiplatelet agents recent onset of nonmassive hemoptysis and acute on chronic hypoxemia.  Also has a background history of dyslipidemia chronic hypoxemia requiring 2 L supplemental O2 at night peripheral vascular disease sleep apnea history of lung cancer on the right status post full scope of treatment came in with COPD with acute on chronic exacerbation with hypoxemia as well as non massive hemoptysis consultation for pulmonary placed due to ongoing NON massive hemoptysis with hypoxemia.   01/31/24- patient got up and walked around today, reports feeling better near baseline.  Is on 3L/min [place]  02/01/24- patient still has mild non massive hemoptysis, will continue nebulized txa. She is on blood thinners.  She requests to stay one more day to improve to baseline.  Discussed care plan with daughter Nils Baseman  PAST MEDICAL HISTORY   Past Medical History:  Diagnosis Date   Anemia    Arthritis    Asthma    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/2016   Cancer (HCC) 2012   Right Lung CA   COPD (chronic obstructive pulmonary disease) (HCC)    Depression    Diabetes mellitus without complication (HCC)    Patient takes Janumet   Essential hypertension 05/26/2016   Heart failure (HCC) 2022   Hydropneumothorax 05/03/2020   Hypercholesteremia    Oxygen  dependent    2L at nite    PAD (peripheral artery disease) (HCC) 06/22/2016   Peripheral vascular disease (HCC)    Personal history of radiation therapy    Shortness of breath dyspnea    with exertion    Sialolithiasis    Sleep apnea     Wears dentures    full upper and lower     SURGICAL HISTORY   Past Surgical History:  Procedure Laterality Date   APPLICATION OF CELL SAVER Left 01/21/2024   Procedure: APPLICATION OF CELL SAVER;  Surgeon: Celso College, MD;  Location: [facility];  Service: Vascular;  Laterality: Left;   CESAREAN SECTION     x3   COLONOSCOPY WITH PROPOFOL  N/A 06/25/2015   Procedure: COLONOSCOPY WITH PROPOFOL ;  Surgeon: Marnee Sink, MD;  Location: [facility];  Service: Endoscopy;  Laterality: N/A;   COLONOSCOPY WITH PROPOFOL  N/A 07/26/2020   Procedure: COLONOSCOPY WITH PROPOFOL ;  Surgeon: Marnee Sink, MD;  Location: [facility];  Service: Endoscopy;  Laterality: N/A;   CYST REMOVAL LEG     and on shoulder    ENDARTERECTOMY FEMORAL Left 01/21/2024   Procedure: LEFT COMMON, SUPERFICIAL FEMORAL AND PROFUNDA FEMORIS ENDARTERECTOMY WITH BOVINE PERICARDIAL PATCH ANGIOPLASTY;  Surgeon: Celso College, MD;  Location: [facility];  Service: Vascular;  Laterality: Left;   ESOPHAGOGASTRODUODENOSCOPY (EGD) WITH PROPOFOL  N/A 07/26/2020   Procedure: ESOPHAGOGASTRODUODENOSCOPY (EGD) WITH PROPOFOL ;  Surgeon: Marnee Sink, MD;  Location: [facility];  Service: Endoscopy;  Laterality: N/A;  Diabetic - oral meds   INSERTION OF ILIAC STENT Left 01/21/2024   Procedure: INSERTION, STENT, ARTERY, ILIAC;  Surgeon: Celso College, MD;  Location: [facility];  Service: Vascular;  Laterality: Left;  AND SFA STENTS   LOWER EXTREMITY ANGIOGRAPHY Left 09/29/2018   Procedure: LOWER EXTREMITY ANGIOGRAPHY;  Surgeon: Celso College, MD;  Location: [facility];  Service: Cardiovascular;  Laterality: Left;   LOWER EXTREMITY ANGIOGRAPHY Left 07/29/2023   Procedure: Lower Extremity Angiography;  Surgeon: Celso College, MD;  Location: [facility];  Service: Cardiovascular;  Laterality: Left;   LOWER EXTREMITY ANGIOGRAPHY Right 12/20/2023   Procedure: Lower Extremity Angiography;  Surgeon: Celso College, MD;   Location: [facility];  Service: Cardiovascular;  Laterality: Right;   LOWER EXTREMITY ANGIOGRAPHY Left 01/17/2024   Procedure: Lower Extremity Angiography;  Surgeon: Celso College, MD;  Location: [facility];  Service: Cardiovascular;  Laterality: Left;   LUNG BIOPSY  12/30/2011   has lung spots   PACEMAKER IMPLANT  07/14/2021   PACEMAKER LEADLESS INSERTION N/A 07/14/2021   Procedure: PACEMAKER LEADLESS INSERTION;  Surgeon: Percival Brace, MD;  Location: [facility];  Service: Cardiovascular;  Laterality: N/A;   PERIPHERAL VASCULAR CATHETERIZATION Left 06/01/2016   Procedure: Lower Extremity Angiography;  Surgeon: Celso College, MD;  Location: [facility];  Service: Cardiovascular;  Laterality: Left;   PERIPHERAL VASCULAR CATHETERIZATION N/A 06/01/2016   Procedure: Abdominal Aortogram w/Lower Extremity;  Surgeon: Celso College, MD;  Location: [facility];  Service: Cardiovascular;  Laterality: N/A;   PERIPHERAL VASCULAR CATHETERIZATION  06/01/2016   Procedure: Lower Extremity Intervention;  Surgeon: Celso College, MD;  Location: [facility];  Service: Cardiovascular;;   PERIPHERAL VASCULAR CATHETERIZATION Right 06/08/2016   Procedure: Lower Extremity Angiography;  Surgeon: Celso College, MD;  Location: [facility];  Service: Cardiovascular;  Laterality: Right;   PERIPHERAL VASCULAR CATHETERIZATION  06/08/2016   Procedure: Lower Extremity Intervention;  Surgeon: Celso College, MD;  Location: [facility];  Service: Cardiovascular;;   SUBMANDIBULAR GLAND EXCISION Left 12/06/2020   Procedure: EXCISION SUBMANDIBULAR GLAND;  Surgeon: Lesly Raspberry, MD;  Location: [facility];  Service: ENT;  Laterality: Left;  needs to be first case Diabetic - diet controlled   TEMPORARY PACEMAKER N/A 07/11/2021   Procedure: TEMPORARY PACEMAKER;  Surgeon: Percival Brace, MD;  Location: [facility];  Service:  Cardiovascular;  Laterality: N/A;     FAMILY HISTORY   Family History  Problem Relation Age of Onset   Diabetes Mother    Hypercholesterolemia Mother    Lung cancer Father    Diabetes Sister    Diabetes Sister    Hypertension Sister    Diabetes Maternal Grandmother    Diabetes Paternal Grandmother    Heart attack Brother    Coronary artery disease Brother    Vascular Disease Brother    Heart attack Brother    Breast cancer Neg Hx      SOCIAL HISTORY   Social History   Tobacco Use   Smoking status: Former    Current packs/day: 0.00    Average packs/day: 1 pack/day for 37.0 years (37.0 ttl pk-yrs)    Types: Cigarettes    Start date: 02/06/1973    Quit date: 02/06/2010    Years since quitting: 13.9   Smokeless tobacco: Former    Types: Snuff  Vaping Use   Vaping status: Never Used  Substance Use Topics   Alcohol use: Not Currently    Alcohol/week: 5.0 standard drinks of alcohol    Types: 5 Cans of beer per week  Comment: /h x of alcohol abuse -stopped 2012- now drinks 5 beer per week   Drug use: Not Currently    Types: Marijuana, Crack cocaine, Cocaine    Comment: hx of cocaine use- last use 2015; last use marijuana6/22/19,     MEDICATIONS    Home Medication:  Current Outpatient Rx   Order #: [id number] Class: Normal   Order #: [id number] Class: Normal   Order #: [id number] Class: Normal   Order #: [id number] Class: Normal   Order #: [id number] Class: Normal   Order #: [id number] Class: Normal   Order #: [id number] Class: Normal    Current Medication:  Current Facility-Administered Medications:    acetaminophen  (TYLENOL ) tablet 325-650 mg, 325-650 mg, Oral, Q4H PRN **OR** acetaminophen  (TYLENOL ) suppository 325-650 mg, 325-650 mg, Rectal, Q4H PRN, Vonna Guardian, Donald Frost, MD   albuterol  (PROVENTIL ) (2.5 MG/3ML) 0.083% nebulizer solution 2.5 mg, 2.5 mg, Nebulization, Q6H, Dew, Jason S, MD, 2.5 mg at 02/02/24 0226   ALPRAZolam  (XANAX ) tablet 0.5 mg, 0.5 mg, Oral, BID PRN,  Dew, Jason S, MD, 0.5 mg at 02/01/24 2157   alum & mag hydroxide-simeth (MAALOX/MYLANTA) 200-200-20 MG/5ML suspension 15-30 mL, 15-30 mL, Oral, Q2H PRN, Celso College, MD, 30 mL at 02/01/24 0654   aspirin  EC tablet 81 mg, 81 mg, Oral, Daily, Dew, Jason S, MD, 81 mg at 02/01/24 0850   atorvastatin  (LIPITOR) tablet 10 mg, 10 mg, Oral, Daily, Dew, Jason S, MD, 10 mg at 02/01/24 0851   calcium  carbonate (OS-CAL - dosed in mg of elemental calcium ) tablet 1,250 mg, 500 mg of elemental calcium , Oral, BID WC, Dew, Jason S, MD, 1,250 mg at 02/01/24 1606   cholecalciferol  (VITAMIN D3) 25 MCG (1000 UNIT) tablet 1,000 Units, 1,000 Units, Oral, q AM, Vonna Guardian, Donald Frost, MD, 1,000 Units at 02/02/24 0607   clopidogrel  (PLAVIX ) tablet 75 mg, 75 mg, Oral, Daily, Pace, Brien R, NP, 75 mg at 02/01/24 0851   cyclobenzaprine  (FLEXERIL ) tablet 10 mg, 10 mg, Oral, QHS PRN, Dew, Jason S, MD, 10 mg at 01/30/24 2025   diphenhydrAMINE  (BENADRYL ) capsule 25 mg, 25 mg, Oral, Q6H PRN, Celso College, MD, 25 mg at 01/23/24 2201   escitalopram  (LEXAPRO ) tablet 10 mg, 10 mg, Oral, Daily, Dew, Donald Frost, MD, 10 mg at 02/01/24 3295   famotidine  (PEPCID ) tablet 20 mg, 20 mg, Oral, Daily PRN, Celso College, MD   ferrous sulfate  tablet 325 mg, 325 mg, Oral, BID WC, Dew, Jason S, MD, 325 mg at 02/01/24 1606   fluticasone  furoate-vilanterol (BREO ELLIPTA ) 100-25 MCG/ACT 1 puff, 1 puff, Inhalation, Daily, Dew, Donald Frost, MD, 1 puff at 02/01/24 0847   gabapentin  (NEURONTIN ) capsule 300 mg, 300 mg, Oral, TID, Dew, Jason S, MD, 300 mg at 02/01/24 2157   guaiFENesin -dextromethorphan (ROBITUSSIN DM) 100-10 MG/5ML syrup 15 mL, 15 mL, Oral, Q4H PRN, Dew, Donald Frost, MD   hydrALAZINE  (APRESOLINE ) injection 5 mg, 5 mg, Intravenous, Q20 Min PRN, Dew, Donald Frost, MD   insulin  aspart (novoLOG ) injection 0-20 Units, 0-20 Units, Subcutaneous, TID AC & HS, Elisabeth Guild, NP, 11 Units at 02/01/24 2157   labetalol  (NORMODYNE ) injection 10 mg, 10 mg, Intravenous, Q10 min  PRN, Celso College, MD, 10 mg at 01/30/24 1309   losartan (COZAAR) tablet 25 mg, 25 mg, Oral, Daily, Decoste, Gabriella, PA-C, 25 mg at 02/01/24 0850   magnesium  hydroxide (MILK OF MAGNESIA) suspension 30 mL, 30 mL, Oral, Daily PRN, Celso College, MD, 30 mL at 01/25/24 1131   metoprolol  succinate (TOPROL -XL) 24 hr tablet  50 mg, 50 mg, Oral, Daily, Decoste, Gabriella, PA-C, 50 mg at 02/01/24 8295   metoprolol  tartrate (LOPRESSOR ) injection 2-5 mg, 2-5 mg, Intravenous, Q2H PRN, Dew, Donald Frost, MD   ondansetron  (ZOFRAN ) injection 4 mg, 4 mg, Intravenous, Q6H PRN, Celso College, MD, 4 mg at 01/18/24 1447   oxyCODONE -acetaminophen  (PERCOCET/ROXICET) 5-325 MG per tablet 1-2 tablet, 1-2 tablet, Oral, Q4H PRN, Celso College, MD, 2 tablet at 02/02/24 6213   pantoprazole  (PROTONIX ) EC tablet 40 mg, 40 mg, Oral, Daily, Dew, Donald Frost, MD, 40 mg at 02/01/24 0852   phenol (CHLORASEPTIC) mouth spray 1 spray, 1 spray, Mouth/Throat, PRN, Dew, Donald Frost, MD   sodium phosphate  (FLEET) enema 1 enema, 1 enema, Rectal, Daily PRN, Vonna Guardian, Donald Frost, MD   sorbitol  70 % solution 30 mL, 30 mL, Oral, Daily PRN, Vonna Guardian, Donald Frost, MD   sulfamethoxazole-trimethoprim (BACTRIM) 400-80 MG per tablet 1 tablet, 1 tablet, Oral, Q12H, Rinoa Garramone, MD, 1 tablet at 02/01/24 2157   tranexamic acid (CYKLOKAPRON) 1000 MG/10ML nebulizer solution 500 mg, 500 mg, Nebulization, TID, Maclain Cohron, MD, 500 mg at 02/02/24 0226   umeclidinium bromide  (INCRUSE ELLIPTA ) 62.5 MCG/ACT 1 puff, 1 puff, Inhalation, Daily, Dew, Donald Frost, MD, 1 puff at 02/01/24 0845   vitamin B-12 (CYANOCOBALAMIN ) tablet 100 mcg, 100 mcg, Oral, Daily, Dew, Donald Frost, MD, 100 mcg at 02/01/24 0865    ALLERGIES   Iodinated contrast media, Trelegy ellipta  [fluticasone -umeclidin-vilant], and Bactoshield chg [chlorhexidine  gluconate]     REVIEW OF SYSTEMS    Review of Systems:  Gen:  Denies  fever, sweats, chills weigh loss  HEENT: Denies blurred vision, double vision, ear pain,  eye pain, hearing loss, nose bleeds, sore throat Cardiac:  No dizziness, chest pain or heaviness, chest tightness,edema Resp:   reports dyspnea chronically  Gi: Denies swallowing difficulty, stomach pain, nausea or vomiting, diarrhea, constipation, bowel incontinence Gu:  Denies bladder incontinence, burning urine Ext:   Denies Joint pain, stiffness or swelling Skin: Denies  skin rash, easy bruising or bleeding or hives Endoc:  Denies polyuria, polydipsia , polyphagia or weight change Psych:   Denies depression, insomnia or hallucinations   Other:  All other systems negative   VS: BP 118/82 (BP Location: Right Arm)   Pulse 72   Temp 97.9 F (36.6 C) (Oral)   Resp 18   Ht 4' 11 (1.499 m)   Wt 59.9 kg   SpO2 99%   BMI 26.67 kg/m      PHYSICAL EXAM    GENERAL:NAD, no fevers, chills, no weakness no fatigue HEAD: Normocephalic, atraumatic.  EYES: Pupils equal, round, reactive to light. Extraocular muscles intact. No scleral icterus.  MOUTH: Moist mucosal membrane. Dentition intact. No abscess noted.  EAR, NOSE, THROAT: Clear without exudates. No external lesions.  NECK: Supple. No thyromegaly. No nodules. No JVD.  PULMONARY: decreased breath sounds with mild rhonchi worse at bases bilaterally.  CARDIOVASCULAR: S1 and S2. Regular rate and rhythm. No murmurs, rubs, or gallops. No edema. Pedal pulses 2+ bilaterally.  GASTROINTESTINAL: Soft, nontender, nondistended. No masses. Positive bowel sounds. No hepatosplenomegaly.  MUSCULOSKELETAL: No swelling, clubbing, or edema. Range of motion full in all extremities.  NEUROLOGIC: Cranial nerves II through XII are intact. No gross focal neurological deficits. Sensation intact. Reflexes intact.  SKIN: No ulceration, lesions, rashes, or cyanosis. Skin warm and dry. Turgor intact.  PSYCHIATRIC: Mood, affect within normal limits. The patient is awake, alert and oriented x 3. Insight, judgment intact.  IMAGING     ASSESSMENT/PLAN    Non massive hemoptysis     - patient is on antiplatelet agent but has blood streaked cough only.  Will treat with nebulized tXA x q8h x 1 day    - there is a right lower lobe infiltrate , I think she would benefit from empiric treatment with bactrim PO BID to cover for cAP and MRSA    - continue incentive spirometry    COPD with centrilobular emphysema    - continue albuterol  and incruse ellipta     - continue prednisone  20mg       Right lower lobe atelectasis    - continue incentive spirometry    Thank you for allowing me to participate in the care of this patient.   Patient/Family are satisfied with care plan and all questions have been answered.    Provider disclosure: Patient with at least one acute or chronic illness or injury that poses a threat to life or bodily function and is being managed actively during this encounter.  All of the below services have been performed independently by signing provider:  review of prior documentation from internal and or external health records.  Review of previous and current lab results.  Interview and comprehensive assessment during patient visit today. Review of current and previous chest radiographs/CT scans. Discussion of management and test interpretation with health care team and patient/family.   This document was prepared using Dragon voice recognition software and may include unintentional dictation errors.     Zahria Ding, M.D.  Division of Pulmonary & Critical Care Medicine

## 2024-02-02 NOTE — Progress Notes (Signed)
 Discharge teaching complete. Meds, diet, activity, follow up appointments and incision care reviewed and all questions answered. Copy of instructions given to patient and meds given to patient per pharmacy.

## 2024-02-02 NOTE — Progress Notes (Signed)
 Left groin dressing removed from incision. Staples in place and incision clean and dry with no redness. Incision painted with Betadine per order and instructed patient on how to do so.

## 2024-02-02 NOTE — Discharge Summary (Signed)
 [facility]    Discharge Summary    Patient ID:  Angela Jensen MRN: [id number] DOB/AGE: April 22, 1956 68 y.o.  Admit date: 01/17/2024 Discharge date: 02/02/2024 Date of Surgery: 01/21/2024 Surgeon: Surgeon(s): Dew, Donald Frost, MD Schnier, Ninette Basque, MD  Admission Diagnosis: Atherosclerosis of artery of extremity with rest pain [facility]) [I70.229] Ischemic leg [I99.8]  Discharge Diagnoses:  Atherosclerosis of artery of extremity with rest pain [facility]) [I70.229] Ischemic leg [I99.8]  Secondary Diagnoses: Past Medical History:  Diagnosis Date   Anemia    Arthritis    Asthma    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/2016   Cancer (HCC) 2012   Right Lung CA   COPD (chronic obstructive pulmonary disease) (HCC)    Depression    Diabetes mellitus without complication (HCC)    Patient takes Janumet   Essential hypertension 05/26/2016   Heart failure (HCC) 2022   Hydropneumothorax 05/03/2020   Hypercholesteremia    Oxygen  dependent    2L at nite    PAD (peripheral artery disease) (HCC) 06/22/2016   Peripheral vascular disease (HCC)    Personal history of radiation therapy    Shortness of breath dyspnea    with exertion    Sialolithiasis    Sleep apnea    Wears dentures    full upper and lower    Procedure(s): LEFT COMMON, SUPERFICIAL FEMORAL AND PROFUNDA FEMORIS ENDARTERECTOMY WITH BOVINE PERICARDIAL PATCH ANGIOPLASTY INSERTION, STENT, ARTERY, ILIAC APPLICATION OF CELL SAVER  Discharged Condition: good  HPI:  Angela Jensen is a 68 year old female with a past medical history of lung cancer now status post XRT, history of recurrent pneumothorax, hypertension, chronic heart failure, HTN, IDDM, COPD/asthma with baseline 3 L O2, OSA, tobacco use, PVD.  Patient presented to [facility] emergency department on 01/21/2024 for a left femoral endarterectomy with iliac stent placement.  She has been recovering well.  She did  have some difficulty with her COPD postoperatively but has been treated accordingly with the hospitalist intervention.  She was also seen by pulmonology Dr Aleskerov for some streaking of blood in her sputum that she coughs.  She will follow-up with pulmonology accordingly.  No complications to note from this.  Vitals are remained stable.  Patient ready for discharge.  Patient to be discharged on aspirin  81 mg daily, Plavix  75 mg daily and Lipitor 10 mg daily.  Patient was counseled not to miss or skip any of these medications as this will alter the outcome of her surgery.  Patient verbalizes her understanding.  I spent greater than 60 minutes preparing teaching and completing this discharge for the patient.  Hospital Course:  Angela Jensen is a 68 y.o. female is S/P Left femoral endarterectomy with iliac stent placement.  Patient is resting comfortably in bed this morning.  She remains on her baseline 3 L nasal cannula oxygen  for her severe COPD.  She has ambulated the halls with physical therapy without complication.  She is eating well.  Vitals all remained stable.  Patient to be discharged today  Extubated: POD # 0 Physical Exam:  Alert notes x3, no acute distress Face: Symmetrical.  Tongue is midline. Neck: Trachea is midline.  No swelling or bruising. Cardiovascular: Regular rate and rhythm Pulmonary: Clear to auscultation bilaterally Abdomen: Soft, nontender, nondistended Left groin access: Clean dry and intact.  No swelling or drainage noted Left lower extremity: Thigh soft.  Calf soft.  Extremities warm distally toes.  Hard to palpate pedal  pulses however the foot is warm is her good capillary refill. Right lower extremity: Thigh soft.  Calf soft.  Extremities warm distally toes.  Hard to palpate pedal pulses however the foot is warm is her good capillary refill. Neurological: No deficits noted   Post-op wounds:  clean, dry, intact or healing well  Pt. Ambulating, voiding and taking  PO diet without difficulty. Pt pain controlled with PO pain meds.  Labs:  As below  Complications: none  Consults:  Treatment Team:  Custovic, Sabina, DO Aleskerov, Fuad, MD  Significant Diagnostic Studies: CBC Lab Results  Component Value Date   WBC 10.9 (H) 01/30/2024   HGB 9.4 (L) 01/30/2024   HCT 30.4 (L) 01/30/2024   MCV 94.1 01/30/2024   PLT 324 01/30/2024    BMET    Component Value Date/Time   NA 137 01/30/2024 0435   NA 144 10/04/2023 1047   NA 141 04/17/2014 1055   K 4.6 01/30/2024 0435   K 3.9 04/17/2014 1055   CL 100 01/30/2024 0435   CL 102 04/17/2014 1055   CO2 32 01/30/2024 0435   CO2 30 04/17/2014 1055   GLUCOSE 228 (H) 01/30/2024 0435   GLUCOSE 201 (H) 04/17/2014 1055   BUN 22 01/30/2024 0435   BUN 12 10/04/2023 1047   BUN 10 04/17/2014 1055   CREATININE 0.69 01/30/2024 0435   CREATININE 0.84 07/06/2023 1016   CREATININE 0.90 04/17/2014 1055   CALCIUM  8.5 (L) 01/30/2024 0435   CALCIUM  9.1 04/17/2014 1055   GFRNONAA >60 01/30/2024 0435   GFRNONAA >60 07/06/2023 1016   GFRNONAA >60 04/17/2014 1055   GFRAA >60 05/05/2020 0558   GFRAA >60 04/17/2014 1055   COAG Lab Results  Component Value Date   INR 1.1 01/17/2024   INR 1.0 04/24/2020   INR 0.9 04/04/2020     Disposition:  Discharge to :Home  Allergies as of 02/02/2024       Reactions   Iodinated Contrast Media Itching   Trelegy Ellipta  [fluticasone -umeclidin-vilant]    Had breathing issues   Bactoshield Chg [chlorhexidine  Gluconate] Itching, Other (See Comments)   Dry. Flaking, peeling skin        Medication List     STOP taking these medications    lisinopril -hydrochlorothiazide  20-25 MG tablet Commonly known as: ZESTORETIC    methylPREDNISolone  4 MG Tbpk tablet Commonly known as: MEDROL  DOSEPAK   Ohtuvayre  3 MG/2.5ML Susp Generic drug: Ensifentrine        TAKE these medications    Accu-Chek Guide Test test strip Generic drug: glucose blood USE TO TEST BLOOD  SUGAR 5 TIMES DAILY   Accu-Chek Guide w/Device Kit Use as directed Dx e11.65   Accu-Chek Softclix Lancets lancets Use 1 lancet 3 times daily to check glucose for diabetes   albuterol  108 (90 Base) MCG/ACT inhaler Commonly known as: VENTOLIN  HFA INHALE 2 PUFFS BY MOUTH EVERY 6 HOURS AS NEEDED FOR WHEEZING OR SHORTNESS OF BREATH   ALPRAZolam  0.5 MG tablet Commonly known as: XANAX  Take 1 tablet (0.5 mg total) by mouth 2 (two) times daily as needed for anxiety.   aspirin  EC 81 MG tablet Take 1 tablet (81 mg total) by mouth daily. Swallow whole.   atorvastatin  10 MG tablet Commonly known as: Lipitor Take 1 tablet (10 mg total) by mouth daily.   calcium  carbonate 1500 (600 Ca) MG Tabs tablet Commonly known as: OSCAL Take 600 mg of elemental calcium  by mouth 2 (two) times daily with a meal.   clopidogrel  75 MG  tablet Commonly known as: PLAVIX  Take 1 tablet (75 mg total) by mouth daily.   cyclobenzaprine  10 MG tablet Commonly known as: FLEXERIL  Take 1 tablet (10 mg total) by mouth at bedtime. Take one tab po qhs for back spasm prn only   Dexcom G7 Receiver Devi Use one as directed for uncontrolled dm   Dexcom G7 Sensor Misc USE AS DIRECTED. CHANGE EVERY 10 DAYS.   Dulera  200-5 MCG/ACT Aero Generic drug: mometasone -formoterol  Inhale 2 puffs by mouth twice daily   escitalopram  10 MG tablet Commonly known as: LEXAPRO  Take 1 tablet (10 mg total) by mouth daily.   famotidine  20 MG tablet Commonly known as: PEPCID  Take 1 tablet (20 mg total) by mouth daily for 14 days. What changed:  when to take this reasons to take this   ferrous sulfate  325 (65 FE) MG tablet Take 325 mg by mouth 2 (two) times daily with a meal.   furosemide  20 MG tablet Commonly known as: LASIX  Take 1 tablet (20 mg total) by mouth daily as needed.   gabapentin  300 MG capsule Commonly known as: NEURONTIN  TAKE 1 CAPSULE BY MOUTH THREE TIMES DAILY   insulin  glargine (1 Unit Dial ) 300 UNIT/ML  Solostar Pen Commonly known as: TOUJEO  Inject 10 Units into the skin daily.   ipratropium-albuterol  0.5-2.5 (3) MG/3ML Soln Commonly known as: DUONEB USE 1 AMPULE IN NEBULIZER EVERY 4 HOURS AS NEEDED   losartan 25 MG tablet Commonly known as: COZAAR Take 1 tablet (25 mg total) by mouth daily.   metFORMIN  500 MG 24 hr tablet Commonly known as: GLUCOPHAGE -XR TAKE 2 TABLETS BY MOUTH ONCE DAILY WITH BREAKFAST What changed: See the new instructions.   metoprolol  succinate 50 MG 24 hr tablet Commonly known as: TOPROL -XL Take 1 tablet (50 mg total) by mouth daily. Take with or immediately following a meal. What changed:  medication strength how much to take additional instructions   oxyCODONE -acetaminophen  5-325 MG tablet Commonly known as: PERCOCET/ROXICET Take 1 tablet by mouth every 6 (six) hours as needed for severe pain (pain score 7-10).   OXYGEN  Inhale 4 L into the lungs. PT USES [facility] FOR OXYGEN    potassium chloride  10 MEQ tablet Commonly known as: KLOR-CON  TAKE 1 TABLET BY MOUTH EVERY OTHER DAY   predniSONE  20 MG tablet Commonly known as: DELTASONE  Take 1 tablet (20 mg total) by mouth daily with breakfast for 3 days.   ReliOn Pen Needles 31G X 6 MM Misc Generic drug: Insulin  Pen Needle USE 1 PEN NEEDLE WITH INSULIN  PEN ONCE DAILY FOR DIABETES   Robafen DM 20-200 MG/20ML Liqd Generic drug: Dextromethorphan-guaiFENesin  Take 30 mLs by mouth every 4 (four) hours as needed.   Spiriva  HandiHaler 18 MCG inhalation capsule Generic drug: tiotropium INHALE THE CONTENTS OF 1 CAPSULES BY MOUTH ONCE DAILY - DO NOT SWALLOW CAPSULES   sulfamethoxazole-trimethoprim 400-80 MG tablet Commonly known as: BACTRIM Take 1 tablet by mouth every 12 (twelve) hours for 7 days.   vitamin B-12 100 MCG tablet Commonly known as: CYANOCOBALAMIN  Take 100 mcg by mouth daily.   VITAMIN D  (CHOLECALCIFEROL ) PO Take 1 tablet by mouth in the morning.       Verbal and written  Discharge instructions given to the patient. Wound care per Discharge AVS  Follow-up Information     Antonette Batters, MD. Go in 1 week(s).   Specialties: Cardiology, Internal Medicine Contact information: [address] [place] [address] [phone]         Care,  [facility] Follow up.   Why: They will follow up with you for your home health therapy needs. Contact information: [address] [place] [address] [phone]         Laurence Pons, NP. Schedule an appointment as soon as possible for a visit in 1 week(s).   Specialty: Nurse Practitioner Contact information: [address] [place] [address] [phone]         Erskin Hearing, MD. Schedule an appointment as soon as possible for a visit in 1 week(s).   Specialty: Pulmonary Disease Why: end stage COPD Contact information: [address] [place] [address] [phone]         Valene Gash, NP Follow up in 3 week(s).   Specialty: Vascular Surgery Why: Arterial Duplex U/S of bilateral lower extremities with ABI's Contact information: [address] [place] [address] [phone]                 Signed: Annamaria Barrette, NP  02/02/2024, 9:32 AM

## 2024-02-02 NOTE — Progress Notes (Signed)
 Occupational Therapy Treatment Patient Details Name: Angela Jensen MRN: [id number] DOB: 1956-05-03 Today's Date: 02/02/2024   History of present illness Angela Jensen is a 68yoF who [facility] on 6[facility] due to ischemic limb, now s/p left femoral endarterectomy (6/2 and 6/6). Pt showed elevated troponin on 6/13, cardiology following attributes to demand ischemia. Pt with RR on 6/12 for SOB and required BiPAP, transferred to cardiac progressive unit. PMH: heart failure, SSS s/p micra, HTN, aortic atherosclerosis, IDDM, PVD, COPD on 2L baseline, OSA, tobacco use.   OT comments  Pt seen for OT tx this date. Pt reports she has been performing mobility using RW t/f bathroom for toileting needs mod independent in room this AM. Pt dons underwear with supervision (assist for O2 line to thread through gown), performs multiple STS and transfers without LOB, good hand placement and control. Pt hopeful to discharge home today with family support. Educated pt on pacing activity throughout day for energy conservation, pt verbalizes understanding. Left with RN in room. Discharge recommendation appropriate.       If plan is discharge home, recommend the following:  A little help with walking and/or transfers;A little help with bathing/dressing/bathroom;Assist for transportation   Equipment Recommendations  None recommended by OT       Precautions / Restrictions Precautions Precautions: Fall Recall of Precautions/Restrictions: Intact Precaution/Restrictions Comments: watch HR, O2 Restrictions Weight Bearing Restrictions Per Provider Order: No       Mobility Bed Mobility Overal bed mobility: Modified Independent                  Transfers Overall transfer level: Modified independent Equipment used: None Transfers: Sit to/from Stand Sit to Stand: Modified independent (Device/Increase time)     Step pivot transfers: Modified independent (Device/Increase time)     General transfer comment:  multiple STS throughout session, light assist for O2 line, pt step pivots from [facility] to seated EOB. good control during transfers.     Balance Overall balance assessment: Needs assistance Sitting-balance support: Feet supported Sitting balance-Leahy Scale: Good Sitting balance - Comments: Steady reaching with BOS to don underwear   Standing balance support: No upper extremity supported Standing balance-Leahy Scale: Good Standing balance comment: stands at sink without LOB, no LOB while standing for gown to be tied without UE support                           ADL either performed or assessed with clinical judgement   ADL Overall ADL's : Needs assistance/impaired                     Lower Body Dressing: Minimal assistance;Sit to/from stand;Supervision/safety Lower Body Dressing Details (indicate cue type and reason): minA to don socks, dons underwear supervision level from STS               General ADL Comments: Pt states she has been using RW t/f bathroom MOD I since last night, no LOB or difficulties with increased SOB     Communication Communication Communication: No apparent difficulties Factors Affecting Communication: Hearing impaired   Cognition Arousal: Alert Behavior During Therapy: WFL for tasks assessed/performed Cognition: No apparent impairments                               Following commands: Intact        Cueing   Cueing Techniques: Verbal cues  Pertinent Vitals/ Pain       Pain Assessment Pain Assessment: No/denies pain   Frequency  Min 2X/week        Progress Toward Goals  OT Goals(current goals can now be found in the care plan section)  Progress towards OT goals: Progressing toward goals  Acute Rehab OT Goals OT Goal Formulation: With patient Time For Goal Achievement: 02/18/24 Potential to Achieve Goals: Good  Plan         AM-PAC OT 6 Clicks Daily Activity     Outcome Measure    Help from another person eating meals?: None Help from another person taking care of personal grooming?: None Help from another person toileting, which includes using toliet, bedpan, or urinal?: A Little Help from another person bathing (including washing, rinsing, drying)?: A Little Help from another person to put on and taking off regular upper body clothing?: None Help from another person to put on and taking off regular lower body clothing?: None 6 Click Score: 22    End of Session Equipment Utilized During Treatment: Oxygen   OT Visit Diagnosis: Unsteadiness on feet (R26.81);Other abnormalities of gait and mobility (R26.89)   Activity Tolerance Patient tolerated treatment well   Patient Left in bed;with call bell/phone within reach;with nursing/sitter in room (sitting EOB with RN in room)   Nurse Communication Mobility status        Time: 1010-1021 OT Time Calculation (min): 11 min  Charges: OT General Charges $OT Visit: 1 Visit OT Treatments $Self Care/Home Management : 8-22 mins  Angela Jensen L. Adisen Bennion, OTR/L  02/02/24, 10:29 AM

## 2024-02-02 NOTE — Plan of Care (Signed)
  Problem: Education: Goal: Knowledge of General Education information will improve Description: Including pain rating scale, medication(s)/side effects and non-pharmacologic comfort measures Outcome: Progressing   Problem: Clinical Measurements: Goal: Respiratory complications will improve Outcome: Progressing   Problem: Clinical Measurements: Goal: Cardiovascular complication will be avoided Outcome: Progressing   Problem: Activity: Goal: Risk for activity intolerance will decrease Outcome: Progressing   Problem: Nutrition: Goal: Adequate nutrition will be maintained Outcome: Progressing   Problem: Elimination: Goal: Will not experience complications related to bowel motility Outcome: Progressing   Problem: Elimination: Goal: Will not experience complications related to urinary retention Outcome: Progressing   Problem: Pain Managment: Goal: General experience of comfort will improve and/or be controlled Outcome: Progressing   Problem: Safety: Goal: Ability to remain free from injury will improve Outcome: Progressing

## 2024-02-02 NOTE — TOC Transition Note (Signed)
 Transition of Care [facility]) - Discharge Note   Patient Details  Name: Angela Jensen MRN: [id number] Date of Birth: 1956/08/05  [facility]) CM/SW Contact:  Keeton Kassebaum C Denese Mentink, RN Phone Number: 02/02/2024, 11:12 AM   Clinical Narrative:    Patient discharging home today. She has been notified [facility] will contact her to schedule her resumption of care appointment  Bartholomew Light from [facility] notified of discharge today   TOC signing off.       Barriers to Discharge: Continued Medical Work up   Patient Goals and CMS Choice   CMS Medicare.gov Compare Post Acute Care list provided to:: Patient        Discharge Placement                       Discharge Plan and Services Additional resources added to the After Visit Summary for       Post Acute Care Choice: Home Health                               Social Drivers of Health (SDOH) Interventions SDOH Screenings   Food Insecurity: No Food Insecurity (01/17/2024)  Housing: Unknown (01/17/2024)  Transportation Needs: No Transportation Needs (01/17/2024)  Utilities: Not At Risk (01/17/2024)  Alcohol Screen: Low Risk  (12/11/2021)  Depression (PHQ2-9): Low Risk  (10/13/2023)  Social Connections: Socially Integrated (01/17/2024)  Tobacco Use: Medium Risk (01/21/2024)     Readmission Risk Interventions    01/26/2024   10:40 AM 06/13/2021    2:40 PM  Readmission Risk Prevention Plan  Transportation Screening Complete Complete  PCP or Specialist Appt within 3-5 Days Complete   Social Work Consult for Recovery Care Planning/Counseling Complete Complete  Palliative Care Screening Not Applicable Not Applicable  Medication Review Oceanographer) Complete Complete

## 2024-02-04 DIAGNOSIS — Z950 Presence of cardiac pacemaker: Secondary | ICD-10-CM | POA: Diagnosis not present

## 2024-02-04 DIAGNOSIS — Z7902 Long term (current) use of antithrombotics/antiplatelets: Secondary | ICD-10-CM | POA: Diagnosis not present

## 2024-02-04 DIAGNOSIS — Z923 Personal history of irradiation: Secondary | ICD-10-CM | POA: Diagnosis not present

## 2024-02-04 DIAGNOSIS — R0602 Shortness of breath: Secondary | ICD-10-CM | POA: Diagnosis not present

## 2024-02-04 DIAGNOSIS — Z95820 Peripheral vascular angioplasty status with implants and grafts: Secondary | ICD-10-CM | POA: Diagnosis not present

## 2024-02-04 DIAGNOSIS — J45909 Unspecified asthma, uncomplicated: Secondary | ICD-10-CM | POA: Diagnosis not present

## 2024-02-04 DIAGNOSIS — G4730 Sleep apnea, unspecified: Secondary | ICD-10-CM | POA: Diagnosis not present

## 2024-02-04 DIAGNOSIS — J4489 Other specified chronic obstructive pulmonary disease: Secondary | ICD-10-CM | POA: Diagnosis not present

## 2024-02-04 DIAGNOSIS — E1151 Type 2 diabetes mellitus with diabetic peripheral angiopathy without gangrene: Secondary | ICD-10-CM | POA: Diagnosis not present

## 2024-02-04 DIAGNOSIS — Z79891 Long term (current) use of opiate analgesic: Secondary | ICD-10-CM | POA: Diagnosis not present

## 2024-02-04 DIAGNOSIS — J449 Chronic obstructive pulmonary disease, unspecified: Secondary | ICD-10-CM | POA: Diagnosis not present

## 2024-02-04 DIAGNOSIS — I495 Sick sinus syndrome: Secondary | ICD-10-CM | POA: Diagnosis not present

## 2024-02-04 DIAGNOSIS — I110 Hypertensive heart disease with heart failure: Secondary | ICD-10-CM | POA: Diagnosis not present

## 2024-02-04 DIAGNOSIS — F17210 Nicotine dependence, cigarettes, uncomplicated: Secondary | ICD-10-CM | POA: Diagnosis not present

## 2024-02-04 DIAGNOSIS — I70222 Atherosclerosis of native arteries of extremities with rest pain, left leg: Secondary | ICD-10-CM | POA: Diagnosis not present

## 2024-02-04 DIAGNOSIS — Z9981 Dependence on supplemental oxygen: Secondary | ICD-10-CM | POA: Diagnosis not present

## 2024-02-04 DIAGNOSIS — I10 Essential (primary) hypertension: Secondary | ICD-10-CM | POA: Diagnosis not present

## 2024-02-04 DIAGNOSIS — C3431 Malignant neoplasm of lower lobe, right bronchus or lung: Secondary | ICD-10-CM | POA: Diagnosis not present

## 2024-02-04 DIAGNOSIS — I5033 Acute on chronic diastolic (congestive) heart failure: Secondary | ICD-10-CM | POA: Diagnosis not present

## 2024-02-04 DIAGNOSIS — Z7982 Long term (current) use of aspirin: Secondary | ICD-10-CM | POA: Diagnosis not present

## 2024-02-04 DIAGNOSIS — E7800 Pure hypercholesterolemia, unspecified: Secondary | ICD-10-CM | POA: Diagnosis not present

## 2024-02-04 DIAGNOSIS — Z79899 Other long term (current) drug therapy: Secondary | ICD-10-CM | POA: Diagnosis not present

## 2024-02-07 ENCOUNTER — Ambulatory Visit (INDEPENDENT_AMBULATORY_CARE_PROVIDER_SITE_OTHER): Admitting: Nurse Practitioner

## 2024-02-07 ENCOUNTER — Encounter: Payer: Self-pay | Admitting: Nurse Practitioner

## 2024-02-07 ENCOUNTER — Telehealth: Payer: Self-pay

## 2024-02-07 VITALS — BP 94/58 | HR 98 | Temp 97.7°F | Resp 16 | Ht 59.0 in | Wt 117.6 lb

## 2024-02-07 DIAGNOSIS — I998 Other disorder of circulatory system: Secondary | ICD-10-CM

## 2024-02-07 DIAGNOSIS — Z09 Encounter for follow-up examination after completed treatment for conditions other than malignant neoplasm: Secondary | ICD-10-CM | POA: Diagnosis not present

## 2024-02-07 DIAGNOSIS — J9611 Chronic respiratory failure with hypoxia: Secondary | ICD-10-CM

## 2024-02-07 DIAGNOSIS — I739 Peripheral vascular disease, unspecified: Secondary | ICD-10-CM | POA: Diagnosis not present

## 2024-02-07 DIAGNOSIS — J449 Chronic obstructive pulmonary disease, unspecified: Secondary | ICD-10-CM | POA: Diagnosis not present

## 2024-02-07 DIAGNOSIS — M79605 Pain in left leg: Secondary | ICD-10-CM | POA: Diagnosis not present

## 2024-02-07 DIAGNOSIS — I70229 Atherosclerosis of native arteries of extremities with rest pain, unspecified extremity: Secondary | ICD-10-CM | POA: Diagnosis not present

## 2024-02-07 DIAGNOSIS — Z85118 Personal history of other malignant neoplasm of bronchus and lung: Secondary | ICD-10-CM | POA: Diagnosis not present

## 2024-02-07 DIAGNOSIS — Z760 Encounter for issue of repeat prescription: Secondary | ICD-10-CM

## 2024-02-07 MED ORDER — OXYCODONE-ACETAMINOPHEN 5-325 MG PO TABS: 1.0000 | ORAL_TABLET | Freq: Four times a day (QID) | ORAL | 0 refills | Status: DC | PRN

## 2024-02-07 MED ORDER — GABAPENTIN 300 MG PO CAPS
300.0000 mg | ORAL_CAPSULE | Freq: Three times a day (TID) | ORAL | 5 refills | Status: DC
Start: 2024-02-07 — End: 2024-03-06

## 2024-02-07 NOTE — Progress Notes (Unsigned)
 [facility] [place], [place] [address] [place] [address] [phone]                                   Transitional Care Clinic   [facility] Discharge Acute Issues Care Follow Up                                                                        Patient Demographics  Angela Jensen, is a 68 y.o. female  DOB 11-30-1955  MRN [id number].  Primary MD  Liana Fish, NP  Admit date: 01/17/2024 Discharge date: 02/02/2024  Reason for TCC follow Up - Atherosclerosis of artery of extremity with rest pain , Ischemic leg    Past Medical History:  Diagnosis Date   Anemia    Arthritis    Asthma    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/2016   Cancer (HCC) 2012   Right Lung CA   COPD (chronic obstructive pulmonary disease) (HCC)    Depression    Diabetes mellitus without complication (HCC)    Patient takes Janumet   Essential hypertension 05/26/2016   Heart failure (HCC) 2022   Hydropneumothorax 05/03/2020   Hypercholesteremia    Oxygen  dependent    2L at nite    PAD (peripheral artery disease) (HCC) 06/22/2016   Peripheral vascular disease (HCC)    Personal history of radiation therapy    Shortness of breath dyspnea    with exertion    Sialolithiasis    Sleep apnea    Wears dentures    full upper and lower    Past Surgical History:  Procedure Laterality Date   APPLICATION OF CELL SAVER Left 01/21/2024   Procedure: APPLICATION OF CELL SAVER;  Surgeon: Marea Selinda RAMAN, MD;  Location: [facility];  Service: Vascular;  Laterality: Left;   CESAREAN SECTION     x3   COLONOSCOPY WITH PROPOFOL  N/A 06/25/2015   Procedure: COLONOSCOPY WITH PROPOFOL ;  Surgeon: Rogelia Copping, MD;  Location: [facility];  Service: Endoscopy;  Laterality: N/A;   COLONOSCOPY WITH PROPOFOL  N/A 07/26/2020   Procedure: COLONOSCOPY WITH PROPOFOL ;  Surgeon: Copping Rogelia, MD;  Location: [facility];  Service:  Endoscopy;  Laterality: N/A;   CYST REMOVAL LEG     and on shoulder    ENDARTERECTOMY FEMORAL Left 01/21/2024   Procedure: LEFT COMMON, SUPERFICIAL FEMORAL AND PROFUNDA FEMORIS ENDARTERECTOMY WITH BOVINE PERICARDIAL PATCH ANGIOPLASTY;  Surgeon: Marea Selinda RAMAN, MD;  Location: [facility];  Service: Vascular;  Laterality: Left;   ESOPHAGOGASTRODUODENOSCOPY (EGD) WITH PROPOFOL  N/A 07/26/2020   Procedure: ESOPHAGOGASTRODUODENOSCOPY (EGD) WITH PROPOFOL ;  Surgeon: Copping Rogelia, MD;  Location: [facility];  Service: Endoscopy;  Laterality: N/A;  Diabetic - oral meds   INSERTION OF ILIAC STENT Left 01/21/2024   Procedure: INSERTION, STENT, ARTERY, ILIAC;  Surgeon: Marea Selinda RAMAN, MD;  Location: [facility];  Service: Vascular;  Laterality: Left;  AND SFA STENTS   LOWER EXTREMITY ANGIOGRAPHY Left 09/29/2018   Procedure: LOWER EXTREMITY ANGIOGRAPHY;  Surgeon: Marea Selinda RAMAN, MD;  Location: [facility];  Service: Cardiovascular;  Laterality: Left;   LOWER EXTREMITY  ANGIOGRAPHY Left 07/29/2023   Procedure: Lower Extremity Angiography;  Surgeon: Marea Selinda RAMAN, MD;  Location: [facility];  Service: Cardiovascular;  Laterality: Left;   LOWER EXTREMITY ANGIOGRAPHY Right 12/20/2023   Procedure: Lower Extremity Angiography;  Surgeon: Marea Selinda RAMAN, MD;  Location: [facility];  Service: Cardiovascular;  Laterality: Right;   LOWER EXTREMITY ANGIOGRAPHY Left 01/17/2024   Procedure: Lower Extremity Angiography;  Surgeon: Marea Selinda RAMAN, MD;  Location: [facility];  Service: Cardiovascular;  Laterality: Left;   LUNG BIOPSY  12/30/2011   has lung spots   PACEMAKER IMPLANT  07/14/2021   PACEMAKER LEADLESS INSERTION N/A 07/14/2021   Procedure: PACEMAKER LEADLESS INSERTION;  Surgeon: Ammon Blunt, MD;  Location: [facility];  Service: Cardiovascular;  Laterality: N/A;   PERIPHERAL VASCULAR CATHETERIZATION Left 06/01/2016   Procedure: Lower Extremity Angiography;  Surgeon:  Selinda RAMAN Marea, MD;  Location: [facility];  Service: Cardiovascular;  Laterality: Left;   PERIPHERAL VASCULAR CATHETERIZATION N/A 06/01/2016   Procedure: Abdominal Aortogram w/Lower Extremity;  Surgeon: Selinda RAMAN Marea, MD;  Location: [facility];  Service: Cardiovascular;  Laterality: N/A;   PERIPHERAL VASCULAR CATHETERIZATION  06/01/2016   Procedure: Lower Extremity Intervention;  Surgeon: Selinda RAMAN Marea, MD;  Location: [facility];  Service: Cardiovascular;;   PERIPHERAL VASCULAR CATHETERIZATION Right 06/08/2016   Procedure: Lower Extremity Angiography;  Surgeon: Selinda RAMAN Marea, MD;  Location: [facility];  Service: Cardiovascular;  Laterality: Right;   PERIPHERAL VASCULAR CATHETERIZATION  06/08/2016   Procedure: Lower Extremity Intervention;  Surgeon: Selinda RAMAN Marea, MD;  Location: [facility];  Service: Cardiovascular;;   SUBMANDIBULAR GLAND EXCISION Left 12/06/2020   Procedure: EXCISION SUBMANDIBULAR GLAND;  Surgeon: Herminio Miu, MD;  Location: [facility];  Service: ENT;  Laterality: Left;  needs to be first case Diabetic - diet controlled   TEMPORARY PACEMAKER N/A 07/11/2021   Procedure: TEMPORARY PACEMAKER;  Surgeon: Ammon Blunt, MD;  Location: [facility];  Service: Cardiovascular;  Laterality: N/A;       Recent HPI and Hospital Course  HPI:  Angela Jensen is a 68 year old female with a past medical history of lung cancer now status post XRT, history of recurrent pneumothorax, hypertension, chronic heart failure, HTN, IDDM, COPD/asthma with baseline 3 L O2, OSA, tobacco use, PVD.  Patient presented to [facility] emergency department on 01/21/2024 for a left femoral endarterectomy with iliac stent placement.  She has been recovering well.  She did have some difficulty with her COPD postoperatively but has been treated accordingly with the hospitalist intervention.  She was also seen by pulmonology Dr Aleskerov for some streaking of  blood in her sputum that she coughs.  She will follow-up with pulmonology accordingly.  No complications to note from this.  Vitals are remained stable.  Patient ready for discharge.   Patient to be discharged on aspirin  81 mg daily, Plavix  75 mg daily and Lipitor 10 mg daily.  Patient was counseled not to miss or skip any of these medications as this will alter the outcome of her surgery.  Patient verbalizes her understanding.     Hospital Course:  Angela Jensen is a 68 y.o. female is S/P Left femoral endarterectomy with iliac stent placement.  Patient is resting comfortably in bed this morning.  She remains on her baseline 3 L nasal cannula oxygen  for her severe COPD.  She has ambulated the halls with physical therapy without complication.  She is eating well.  Vitals all remained stable.  Patient to be discharged today  Post Hospital Acute Care Issue to be followed in the Clinic   Atherosclerosis of artery of extremity with rest pain (HCC) [I70.229] Ischemic leg [I99.8] COPD Hypertension     Subjective:   Angela Jensen today has, No headache, No chest pain, No abdominal pain - No Nausea, No new weakness tingling or numbness, No Cough - SOB. Pain and tenderness at the surgical site, no signs of infection and no purulent drainage.   Assessment & Plan   1. Hospital discharge follow-up (Primary) Surgery performed by vascular surgery to treat blockages in her legs. The pain related to the blockages has improved. She is having significant postoperative pain but this should improve as she continues to heal. Prn oxycodone  refill sent to pharmacy to help with the pain while she is healing.   2. Atherosclerosis of artery of extremity with rest pain (HCC) Continue gabapentin  as prescribed and prn percocet as prescribed.  - gabapentin  (NEURONTIN ) 300 MG capsule; Take 1 capsule (300 mg total) by mouth 3 (three) times daily.  Dispense: 90 capsule; Refill: 5 - oxyCODONE -acetaminophen   (PERCOCET/ROXICET) 5-325 MG tablet; Take 1 tablet by mouth every 6 (six) hours as needed for severe pain (pain score 7-10).  Dispense: 30 tablet; Refill: 0  3. Ischemic leg Continue gabapentin  as prescribed and prn percocet as prescribed.  - gabapentin  (NEURONTIN ) 300 MG capsule; Take 1 capsule (300 mg total) by mouth 3 (three) times daily.  Dispense: 90 capsule; Refill: 5 - oxyCODONE -acetaminophen  (PERCOCET/ROXICET) 5-325 MG tablet; Take 1 tablet by mouth every 6 (six) hours as needed for severe pain (pain score 7-10).  Dispense: 30 tablet; Refill: 0  4. Chronic respiratory failure with hypoxia (HCC) Referred to Dr. Aleskerov with [facility] pulmonology  - Ambulatory referral to Pulmonology  5. Chronic obstructive pulmonary disease, unspecified COPD type (HCC) Referred to Dr. Parris with [facility] pulmonology  - Ambulatory referral to Pulmonology  6. Left leg pain Continue gabapentin  as prescribed and continue prn percocet as prescribed.  - gabapentin  (NEURONTIN ) 300 MG capsule; Take 1 capsule (300 mg total) by mouth 3 (three) times daily.  Dispense: 90 capsule; Refill: 5 - oxyCODONE -acetaminophen  (PERCOCET/ROXICET) 5-325 MG tablet; Take 1 tablet by mouth every 6 (six) hours as needed for severe pain (pain score 7-10).  Dispense: 30 tablet; Refill: 0  7. PAD (peripheral artery disease) (HCC) Continue gabapentin  as prescribed  - gabapentin  (NEURONTIN ) 300 MG capsule; Take 1 capsule (300 mg total) by mouth 3 (three) times daily.  Dispense: 90 capsule; Refill: 5  8. History of lung cancer Referred to Dr. Parris with [facility] pulmonology  - Ambulatory referral to Pulmonology    Reason for frequent admissions/ER visits    SSS PAD CHF Hypertension COPD Chronic respiratory failure with hypoxia GERD Diabetes     Objective:   Vitals:   02/07/24 0955  BP: (!) 94/58  Pulse: 98  Resp: 16  Temp: 97.7 F (36.5 C)  SpO2: 93%  Weight: 117  lb 9.6 oz (53.3 kg)  Height: 4' 11 (1.499 m)    Wt Readings from Last 3 Encounters:  02/07/24 117 lb 9.6 oz (53.3 kg)  01/27/24 132 lb 0.9 oz (59.9 kg)  01/12/24 120 lb 6.4 oz (54.6 kg)    Allergies as of 02/07/2024       Reactions   Iodinated Contrast Media Itching   Trelegy Ellipta  [fluticasone -umeclidin-vilant]    Had  breathing issues   Bactoshield Chg [chlorhexidine  Gluconate] Itching, Other (See Comments)   Dry. Flaking, peeling skin        Medication List        Accurate as of February 07, 2024 10:15 AM. If you have any questions, ask your nurse or doctor.          Accu-Chek Guide Test test strip Generic drug: glucose blood USE TO TEST BLOOD SUGAR 5 TIMES DAILY   Accu-Chek Guide w/Device Kit Use as directed Dx e11.65   Accu-Chek Softclix Lancets lancets Use 1 lancet 3 times daily to check glucose for diabetes   albuterol  108 (90 Base) MCG/ACT inhaler Commonly known as: VENTOLIN  HFA INHALE 2 PUFFS BY MOUTH EVERY 6 HOURS AS NEEDED FOR WHEEZING OR SHORTNESS OF BREATH   ALPRAZolam  0.5 MG tablet Commonly known as: XANAX  Take 1 tablet (0.5 mg total) by mouth 2 (two) times daily as needed for anxiety.   aspirin  EC 81 MG tablet Take 1 tablet (81 mg total) by mouth daily. Swallow whole.   atorvastatin  10 MG tablet Commonly known as: Lipitor Take 1 tablet (10 mg total) by mouth daily.   calcium  carbonate 1500 (600 Ca) MG Tabs tablet Commonly known as: OSCAL Take 600 mg of elemental calcium  by mouth 2 (two) times daily with a meal.   clopidogrel  75 MG tablet Commonly known as: PLAVIX  Take 1 tablet (75 mg total) by mouth daily.   cyclobenzaprine  10 MG tablet Commonly known as: FLEXERIL  Take 1 tablet (10 mg total) by mouth at bedtime. Take one tab po qhs for back spasm prn only   Dexcom G7 Receiver Devi Use one as directed for uncontrolled dm   Dexcom G7 Sensor Misc USE AS DIRECTED. CHANGE EVERY 10 DAYS.   Dulera  200-5 MCG/ACT Aero Generic drug:  mometasone -formoterol  Inhale 2 puffs by mouth twice daily   escitalopram  10 MG tablet Commonly known as: LEXAPRO  Take 1 tablet (10 mg total) by mouth daily.   famotidine  20 MG tablet Commonly known as: PEPCID  Take 1 tablet (20 mg total) by mouth daily for 14 days. What changed:  when to take this reasons to take this   ferrous sulfate  325 (65 FE) MG tablet Take 325 mg by mouth 2 (two) times daily with a meal.   furosemide  20 MG tablet Commonly known as: LASIX  Take 1 tablet (20 mg total) by mouth daily as needed.   gabapentin  300 MG capsule Commonly known as: NEURONTIN  TAKE 1 CAPSULE BY MOUTH THREE TIMES DAILY   insulin  glargine (1 Unit Dial ) 300 UNIT/ML Solostar Pen Commonly known as: TOUJEO  Inject 10 Units into the skin daily.   ipratropium-albuterol  0.5-2.5 (3) MG/3ML Soln Commonly known as: DUONEB USE 1 AMPULE IN NEBULIZER EVERY 4 HOURS AS NEEDED   losartan  25 MG tablet Commonly known as: COZAAR  Take 1 tablet (25 mg total) by mouth daily.   metFORMIN  500 MG 24 hr tablet Commonly known as: GLUCOPHAGE -XR TAKE 2 TABLETS BY MOUTH ONCE DAILY WITH BREAKFAST   metoprolol  succinate 50 MG 24 hr tablet Commonly known as: TOPROL -XL Take 1 tablet (50 mg total) by mouth daily. Take with or immediately following a meal.   oxyCODONE -acetaminophen  5-325 MG tablet Commonly known as: PERCOCET/ROXICET Take 1 tablet by mouth every 6 (six) hours as needed for severe pain (pain score 7-10).   OXYGEN  Inhale 4 L into the lungs. PT USES [facility] FOR OXYGEN    potassium chloride  10 MEQ tablet Commonly known as: KLOR-CON  TAKE 1 TABLET BY  MOUTH EVERY OTHER DAY   ReliOn Pen Needles 31G X 6 MM Misc Generic drug: Insulin  Pen Needle USE 1 PEN NEEDLE WITH INSULIN  PEN ONCE DAILY FOR DIABETES   Robafen DM 20-200 MG/20ML Liqd Generic drug: Dextromethorphan-guaiFENesin  Take 30 mLs by mouth every 4 (four) hours as needed.   Spiriva  HandiHaler 18 MCG inhalation capsule Generic drug:  tiotropium INHALE THE CONTENTS OF 1 CAPSULES BY MOUTH ONCE DAILY - DO NOT SWALLOW CAPSULES   sulfamethoxazole -trimethoprim  400-80 MG tablet Commonly known as: BACTRIM  Take 1 tablet by mouth every 12 (twelve) hours for 7 days.   vitamin B-12 100 MCG tablet Commonly known as: CYANOCOBALAMIN  Take 100 mcg by mouth daily.   VITAMIN D  (CHOLECALCIFEROL ) PO Take 1 tablet by mouth in the morning.         Physical Exam: Constitutional: Patient appears well-developed and well-nourished. Not in obvious distress. HENT: Normocephalic, atraumatic Eyes: PERRLA CVS: RRR, S1/S2 +, no murmurs, no gallops, no carotid bruit.  Pulmonary: Effort and breath sounds normal, no stridor, rhonchi, wheezes, rales.  Musculoskeletal: Normal range of motion. No edema and no tenderness.  Lymphadenopathy: No lymphadenopathy noted, cervical, inguinal or axillary Neuro: Alert and oriented Skin: Skin is warm and dry. No rash noted. Not diaphoretic. No erythema. No pallor. Psychiatric: Normal mood and affect.   Data Review   Micro Results No results found for this or any previous visit (from the past 240 hours).   CBC No results for input(s): WBC, HGB, HCT, PLT, MCV, MCH, MCHC, RDW, LYMPHSABS, MONOABS, EOSABS, BASOSABS, BANDABS in the last 168 hours.  Invalid input(s): NEUTRABS, BANDSABD  Chemistries  No results for input(s): NA, K, CL, CO2, GLUCOSE, BUN, CREATININE, CALCIUM , MG, AST, ALT, ALKPHOS, BILITOT in the last 168 hours.  Invalid input(s): GFRCGP ------------------------------------------------------------------------------------------------------------------ estimated creatinine clearance is 50.2 mL/min (by C-G formula based on SCr of 0.69 mg/dL). ------------------------------------------------------------------------------------------------------------------ No results for input(s): HGBA1C in the last 72  hours. ------------------------------------------------------------------------------------------------------------------ No results for input(s): CHOL, HDL, LDLCALC, TRIG, CHOLHDL, LDLDIRECT in the last 72 hours. ------------------------------------------------------------------------------------------------------------------ No results for input(s): TSH, T4TOTAL, T3FREE, THYROIDAB in the last 72 hours.  Invalid input(s): FREET3 ------------------------------------------------------------------------------------------------------------------ No results for input(s): VITAMINB12, FOLATE, FERRITIN, TIBC, IRON , RETICCTPCT in the last 72 hours.  Coagulation profile No results for input(s): INR, PROTIME in the last 168 hours.  No results for input(s): DDIMER in the last 72 hours.  Cardiac Enzymes No results for input(s): CKMB, TROPONINI, MYOGLOBIN in the last 168 hours.  Invalid input(s): CK ------------------------------------------------------------------------------------------------------------------ Invalid input(s): POCBNP  Return for previously scheduled, F/U, [facility] PCP in august .  Time Spent in minutes  45 Time spent with patient included reviewing progress notes, labs, imaging studies, and discussing plan for follow up.   This patient was seen by Mardy Maxin, FNP-C in collaboration with Dr. Sigrid Bathe as a part of collaborative care agreement.    Mardy Maxin MSN, FNP-C on 02/07/2024 at 10:15 AM   **Disclaimer: This note may have been dictated with voice recognition software. Similar sounding words can inadvertently be transcribed and this note may contain transcription errors which may not have been corrected upon publication of note.**

## 2024-02-07 NOTE — Progress Notes (Signed)
   02/07/2024  [name], female   DOB: 1956-04-11, 68 y.o.   MRN: [id number]  Attempted to contact patient for medication management/review.  Was unable to leave HIPAA compliant message for patient to return my call at their convenience as voicemail has not been set up.   Per chart review, patient was scheduled to be in clinic by PCP for discharge follow up. Will follow up with patient in 4 weeks following today's appointment with PCP.  Thank you for allowing pharmacy to be a part of this patient's care.  Dorcas Solian, PharmD Clinical Pharmacist Cell: [phone]

## 2024-02-08 ENCOUNTER — Encounter: Payer: Self-pay | Admitting: Nurse Practitioner

## 2024-02-08 ENCOUNTER — Telehealth: Payer: Self-pay | Admitting: Nurse Practitioner

## 2024-02-08 NOTE — Telephone Encounter (Signed)
 Pulmonary referral sent via [facility] to [facility] w/ [facility] per patient request.  Notified patient. Gave pt telephone# [phone]-Toni

## 2024-02-09 ENCOUNTER — Telehealth: Payer: Self-pay | Admitting: Nurse Practitioner

## 2024-02-09 DIAGNOSIS — Z9981 Dependence on supplemental oxygen: Secondary | ICD-10-CM | POA: Diagnosis not present

## 2024-02-09 DIAGNOSIS — J4489 Other specified chronic obstructive pulmonary disease: Secondary | ICD-10-CM | POA: Diagnosis not present

## 2024-02-09 DIAGNOSIS — Z923 Personal history of irradiation: Secondary | ICD-10-CM | POA: Diagnosis not present

## 2024-02-09 DIAGNOSIS — Z7982 Long term (current) use of aspirin: Secondary | ICD-10-CM | POA: Diagnosis not present

## 2024-02-09 DIAGNOSIS — Z95820 Peripheral vascular angioplasty status with implants and grafts: Secondary | ICD-10-CM | POA: Diagnosis not present

## 2024-02-09 DIAGNOSIS — E1151 Type 2 diabetes mellitus with diabetic peripheral angiopathy without gangrene: Secondary | ICD-10-CM | POA: Diagnosis not present

## 2024-02-09 DIAGNOSIS — G4730 Sleep apnea, unspecified: Secondary | ICD-10-CM | POA: Diagnosis not present

## 2024-02-09 DIAGNOSIS — Z79891 Long term (current) use of opiate analgesic: Secondary | ICD-10-CM | POA: Diagnosis not present

## 2024-02-09 DIAGNOSIS — J45909 Unspecified asthma, uncomplicated: Secondary | ICD-10-CM | POA: Diagnosis not present

## 2024-02-09 DIAGNOSIS — I495 Sick sinus syndrome: Secondary | ICD-10-CM | POA: Diagnosis not present

## 2024-02-09 DIAGNOSIS — Z79899 Other long term (current) drug therapy: Secondary | ICD-10-CM | POA: Diagnosis not present

## 2024-02-09 DIAGNOSIS — I5033 Acute on chronic diastolic (congestive) heart failure: Secondary | ICD-10-CM | POA: Diagnosis not present

## 2024-02-09 DIAGNOSIS — Z950 Presence of cardiac pacemaker: Secondary | ICD-10-CM | POA: Diagnosis not present

## 2024-02-09 DIAGNOSIS — I110 Hypertensive heart disease with heart failure: Secondary | ICD-10-CM | POA: Diagnosis not present

## 2024-02-09 DIAGNOSIS — Z7902 Long term (current) use of antithrombotics/antiplatelets: Secondary | ICD-10-CM | POA: Diagnosis not present

## 2024-02-09 DIAGNOSIS — I70222 Atherosclerosis of native arteries of extremities with rest pain, left leg: Secondary | ICD-10-CM | POA: Diagnosis not present

## 2024-02-09 DIAGNOSIS — C3431 Malignant neoplasm of lower lobe, right bronchus or lung: Secondary | ICD-10-CM | POA: Diagnosis not present

## 2024-02-09 DIAGNOSIS — E7800 Pure hypercholesterolemia, unspecified: Secondary | ICD-10-CM | POA: Diagnosis not present

## 2024-02-09 DIAGNOSIS — F17210 Nicotine dependence, cigarettes, uncomplicated: Secondary | ICD-10-CM | POA: Diagnosis not present

## 2024-02-09 NOTE — Telephone Encounter (Signed)
 Pulmonary appointment was 03/08/2024 w/ [facility]-Toni

## 2024-02-10 ENCOUNTER — Encounter: Payer: Self-pay | Admitting: Nurse Practitioner

## 2024-02-10 DIAGNOSIS — Z79899 Other long term (current) drug therapy: Secondary | ICD-10-CM | POA: Diagnosis not present

## 2024-02-10 DIAGNOSIS — Z7902 Long term (current) use of antithrombotics/antiplatelets: Secondary | ICD-10-CM | POA: Diagnosis not present

## 2024-02-10 DIAGNOSIS — E1151 Type 2 diabetes mellitus with diabetic peripheral angiopathy without gangrene: Secondary | ICD-10-CM | POA: Diagnosis not present

## 2024-02-10 DIAGNOSIS — F17210 Nicotine dependence, cigarettes, uncomplicated: Secondary | ICD-10-CM | POA: Diagnosis not present

## 2024-02-10 DIAGNOSIS — C3431 Malignant neoplasm of lower lobe, right bronchus or lung: Secondary | ICD-10-CM | POA: Diagnosis not present

## 2024-02-10 DIAGNOSIS — I70222 Atherosclerosis of native arteries of extremities with rest pain, left leg: Secondary | ICD-10-CM | POA: Diagnosis not present

## 2024-02-10 DIAGNOSIS — Z7982 Long term (current) use of aspirin: Secondary | ICD-10-CM | POA: Diagnosis not present

## 2024-02-10 DIAGNOSIS — E7800 Pure hypercholesterolemia, unspecified: Secondary | ICD-10-CM | POA: Diagnosis not present

## 2024-02-10 DIAGNOSIS — J45909 Unspecified asthma, uncomplicated: Secondary | ICD-10-CM | POA: Diagnosis not present

## 2024-02-10 DIAGNOSIS — Z9981 Dependence on supplemental oxygen: Secondary | ICD-10-CM | POA: Diagnosis not present

## 2024-02-10 DIAGNOSIS — J4489 Other specified chronic obstructive pulmonary disease: Secondary | ICD-10-CM | POA: Diagnosis not present

## 2024-02-10 DIAGNOSIS — I110 Hypertensive heart disease with heart failure: Secondary | ICD-10-CM | POA: Diagnosis not present

## 2024-02-10 DIAGNOSIS — G4730 Sleep apnea, unspecified: Secondary | ICD-10-CM | POA: Diagnosis not present

## 2024-02-10 DIAGNOSIS — Z923 Personal history of irradiation: Secondary | ICD-10-CM | POA: Diagnosis not present

## 2024-02-10 DIAGNOSIS — I495 Sick sinus syndrome: Secondary | ICD-10-CM | POA: Diagnosis not present

## 2024-02-10 DIAGNOSIS — Z95820 Peripheral vascular angioplasty status with implants and grafts: Secondary | ICD-10-CM | POA: Diagnosis not present

## 2024-02-10 DIAGNOSIS — Z79891 Long term (current) use of opiate analgesic: Secondary | ICD-10-CM | POA: Diagnosis not present

## 2024-02-10 DIAGNOSIS — Z950 Presence of cardiac pacemaker: Secondary | ICD-10-CM | POA: Diagnosis not present

## 2024-02-10 DIAGNOSIS — I5033 Acute on chronic diastolic (congestive) heart failure: Secondary | ICD-10-CM | POA: Diagnosis not present

## 2024-02-11 ENCOUNTER — Telehealth: Payer: Self-pay | Admitting: Nurse Practitioner

## 2024-02-11 NOTE — Telephone Encounter (Signed)
 Dermatology sent via Proficient to Dr. Dela w/ [facility].  Notified patient. Gave pt telephone# [phone]-Toni

## 2024-02-11 NOTE — Telephone Encounter (Signed)
 Received 02/09/24 PT orders from [facility]. Gave to Alyssa-Toni

## 2024-02-15 ENCOUNTER — Other Ambulatory Visit (INDEPENDENT_AMBULATORY_CARE_PROVIDER_SITE_OTHER): Payer: Self-pay | Admitting: Vascular Surgery

## 2024-02-15 DIAGNOSIS — Z9889 Other specified postprocedural states: Secondary | ICD-10-CM

## 2024-02-17 ENCOUNTER — Telehealth: Payer: Self-pay | Admitting: Nurse Practitioner

## 2024-02-17 NOTE — Telephone Encounter (Signed)
 02/09/24 PT order signed & faxed back to [facility]; [phone]. Scanned-Toni

## 2024-02-21 ENCOUNTER — Ambulatory Visit (INDEPENDENT_AMBULATORY_CARE_PROVIDER_SITE_OTHER)

## 2024-02-21 ENCOUNTER — Ambulatory Visit (INDEPENDENT_AMBULATORY_CARE_PROVIDER_SITE_OTHER): Admitting: Nurse Practitioner

## 2024-02-21 ENCOUNTER — Encounter (INDEPENDENT_AMBULATORY_CARE_PROVIDER_SITE_OTHER)

## 2024-02-21 ENCOUNTER — Encounter (INDEPENDENT_AMBULATORY_CARE_PROVIDER_SITE_OTHER): Payer: Self-pay | Admitting: Nurse Practitioner

## 2024-02-21 ENCOUNTER — Encounter: Payer: Self-pay | Admitting: Oncology

## 2024-02-21 VITALS — BP 122/74 | HR 77 | Ht 59.0 in | Wt 116.2 lb

## 2024-02-21 DIAGNOSIS — I739 Peripheral vascular disease, unspecified: Secondary | ICD-10-CM | POA: Diagnosis not present

## 2024-02-21 DIAGNOSIS — Z9889 Other specified postprocedural states: Secondary | ICD-10-CM

## 2024-02-21 NOTE — Progress Notes (Signed)
 Subjective:    [name], female    DOB: 1956/07/19, 68 y.o.   MRN: [id number] Chief Complaint  Patient presents with   Follow up with Delores Orvin BRAVO, NP (Vascular Surgery) in 3     The patient returns to the office for followup and review status post left femoral endarterectomy with intervention on 01/21/2024.    PROCEDURE: 1. Left common femoral, superficial femoral and profunda femoris endarterectomy with bovine pericardial patch angioplasty. 2.    Stent placement to the left external iliac artery with 8 mm diameter by 7.5 cm length Viabahn stent 3.    Mechanical thrombectomy to the left SFA and popliteal arteries with the 8 [place] Rex device 4.   Stent placement to proximal left SFA with 7 mm diameter by 10 cm length Viabahn stent 5.  Stent placement to the left most distal SFA and popliteal artery with 6 mm diameter by 15 cm length Viabahn stent 6.   Angioplasty of the left tibioperoneal trunk and posterior tibial arteries with 2.5 mm diameter angioplasty balloon proximally and then the entirety of the left posterior tibial artery with 2 mm diameter angioplasty: 7.   Mechanical thrombectomy of the left tibioperoneal trunk and posterior tibial arteries with the penumbra 6 bolt device 8.  Instillation of 4 mg of tPA in the left posterior tibial artery distally for residual thrombus after above procedures   The patient notes improvement in the lower extremity symptoms. No interval shortening of the patient's claudication distance or rest pain symptoms. No new ulcers or wounds have occurred since the last visit.  She does still have some neuropathic pain going on in her left lower extremity however  She notes that she has some significant swelling but that appears to have resolved at this time.  She does have some soreness in the groin area but that is to be expected.  The incision is healing fairly well.  There is no evidence of infection.  There is just a small  proximal area that almost appears to have a slight bit of opening.  There are also some areas underneath the incision that feel to be small seromas.  ABI's Rt=0.92 and Lt=0.92  (previous ABI's Rt=0.91 and Lt=0.40) Duplex US  of the bilateral tibial vessels shows biphasic waveforms with normal toe waveforms bilaterally    Review of Systems  Cardiovascular:  Positive for leg swelling.       Home O2  Neurological:  Positive for numbness.  All other systems reviewed and are negative.      Objective:   Physical Exam Vitals reviewed.  HENT:     Head: Normocephalic.  Cardiovascular:     Rate and Rhythm: Normal rate.     Pulses:          Dorsalis pedis pulses are 2+ on the right side and 2+ on the left side.  Pulmonary:     Effort: Pulmonary effort is normal.  Skin:    General: Skin is warm and dry.  Neurological:     Mental Status: She is alert and oriented to person, place, and time.  Psychiatric:        Mood and Affect: Mood normal.        Behavior: Behavior normal.        Thought Content: Thought content normal.        Judgment: Judgment normal.     BP 122/74   Pulse 77   Ht 4' 11 (1.499 m)  Wt 116 lb 4 oz (52.7 kg)   BMI 23.48 kg/m   Past Medical History:  Diagnosis Date   Anemia    Arthritis    Asthma    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/2016   Cancer (HCC) 2012   Right Lung CA   COPD (chronic obstructive pulmonary disease) (HCC)    Depression    Diabetes mellitus without complication (HCC)    Patient takes Janumet   Essential hypertension 05/26/2016   Heart failure (HCC) 2022   Hydropneumothorax 05/03/2020   Hypercholesteremia    Oxygen  dependent    2L at nite    PAD (peripheral artery disease) (HCC) 06/22/2016   Peripheral vascular disease (HCC)    Personal history of radiation therapy    Shortness of breath dyspnea    with exertion    Sialolithiasis    Sleep apnea    Wears dentures     full upper and lower    Social History   Socioeconomic History   Marital status: Widowed    Spouse name: Not on file   Number of children: Not on file   Years of education: Not on file   Highest education level: Not on file  Occupational History   Not on file  Tobacco Use   Smoking status: Former    Current packs/day: 0.00    Average packs/day: 1 pack/day for 37.0 years (37.0 ttl pk-yrs)    Types: Cigarettes    Start date: 02/06/1973    Quit date: 02/06/2010    Years since quitting: 14.0   Smokeless tobacco: Former    Types: Snuff  Vaping Use   Vaping status: Never Used  Substance and Sexual Activity   Alcohol use: Not Currently    Alcohol/week: 5.0 standard drinks of alcohol    Types: 5 Cans of beer per week    Comment: /h x of alcohol abuse -stopped 2012- now drinks 5 beer per week   Drug use: Not Currently    Types: Marijuana, Crack cocaine, Cocaine    Comment: hx of cocaine use- last use 2015; last use marijuana6/22/19,   Sexual activity: Yes  Other Topics Concern   Not on file  Social History Narrative   Lives with Significant Other x 43 years   Social Drivers of Corporate investment banker Strain: Not on file  Food Insecurity: No Food Insecurity (01/17/2024)   Hunger Vital Sign    Worried About Running Out of Food in the Last Year: Never true    Ran Out of Food in the Last Year: Never true  Transportation Needs: No Transportation Needs (01/17/2024)   PRAPARE - Administrator, Civil Service (Medical): No    Lack of Transportation (Non-Medical): No  Physical Activity: Not on file  Stress: Not on file  Social Connections: Socially Integrated (01/17/2024)   Social Connection and Isolation Panel    Frequency of Communication with Friends and Family: More than three times a week    Frequency of Social Gatherings with Friends and Family: More than three times a week    Attends Religious Services: 1 to 4 times per year    Active Member of [employer] or  Organizations: No    Attends Banker Meetings: 1 to 4 times per year    Marital Status: Married  Catering manager Violence: Not At Risk (01/17/2024)   Humiliation, Afraid, Rape, and Kick questionnaire    Fear of Current or Ex-Partner: No  Emotionally Abused: No    Physically Abused: No    Sexually Abused: No    Past Surgical History:  Procedure Laterality Date   APPLICATION OF CELL SAVER Left 01/21/2024   Procedure: APPLICATION OF CELL SAVER;  Surgeon: Marea Selinda RAMAN, MD;  Location: [facility];  Service: Vascular;  Laterality: Left;   CESAREAN SECTION     x3   COLONOSCOPY WITH PROPOFOL  N/A 06/25/2015   Procedure: COLONOSCOPY WITH PROPOFOL ;  Surgeon: Rogelia Copping, MD;  Location: [facility];  Service: Endoscopy;  Laterality: N/A;   COLONOSCOPY WITH PROPOFOL  N/A 07/26/2020   Procedure: COLONOSCOPY WITH PROPOFOL ;  Surgeon: Copping Rogelia, MD;  Location: [facility];  Service: Endoscopy;  Laterality: N/A;   CYST REMOVAL LEG     and on shoulder    ENDARTERECTOMY FEMORAL Left 01/21/2024   Procedure: LEFT COMMON, SUPERFICIAL FEMORAL AND PROFUNDA FEMORIS ENDARTERECTOMY WITH BOVINE PERICARDIAL PATCH ANGIOPLASTY;  Surgeon: Marea Selinda RAMAN, MD;  Location: [facility];  Service: Vascular;  Laterality: Left;   ESOPHAGOGASTRODUODENOSCOPY (EGD) WITH PROPOFOL  N/A 07/26/2020   Procedure: ESOPHAGOGASTRODUODENOSCOPY (EGD) WITH PROPOFOL ;  Surgeon: Copping Rogelia, MD;  Location: [facility];  Service: Endoscopy;  Laterality: N/A;  Diabetic - oral meds   INSERTION OF ILIAC STENT Left 01/21/2024   Procedure: INSERTION, STENT, ARTERY, ILIAC;  Surgeon: Marea Selinda RAMAN, MD;  Location: [facility];  Service: Vascular;  Laterality: Left;  AND SFA STENTS   LOWER EXTREMITY ANGIOGRAPHY Left 09/29/2018   Procedure: LOWER EXTREMITY ANGIOGRAPHY;  Surgeon: Marea Selinda RAMAN, MD;  Location: [facility];  Service: Cardiovascular;  Laterality: Left;   LOWER EXTREMITY ANGIOGRAPHY Left 07/29/2023   Procedure: Lower  Extremity Angiography;  Surgeon: Marea Selinda RAMAN, MD;  Location: [facility];  Service: Cardiovascular;  Laterality: Left;   LOWER EXTREMITY ANGIOGRAPHY Right 12/20/2023   Procedure: Lower Extremity Angiography;  Surgeon: Marea Selinda RAMAN, MD;  Location: [facility];  Service: Cardiovascular;  Laterality: Right;   LOWER EXTREMITY ANGIOGRAPHY Left 01/17/2024   Procedure: Lower Extremity Angiography;  Surgeon: Marea Selinda RAMAN, MD;  Location: [facility];  Service: Cardiovascular;  Laterality: Left;   LUNG BIOPSY  12/30/2011   has lung spots   PACEMAKER IMPLANT  07/14/2021   PACEMAKER LEADLESS INSERTION N/A 07/14/2021   Procedure: PACEMAKER LEADLESS INSERTION;  Surgeon: Ammon Blunt, MD;  Location: [facility];  Service: Cardiovascular;  Laterality: N/A;   PERIPHERAL VASCULAR CATHETERIZATION Left 06/01/2016   Procedure: Lower Extremity Angiography;  Surgeon: Selinda RAMAN Marea, MD;  Location: [facility];  Service: Cardiovascular;  Laterality: Left;   PERIPHERAL VASCULAR CATHETERIZATION N/A 06/01/2016   Procedure: Abdominal Aortogram w/Lower Extremity;  Surgeon: Selinda RAMAN Marea, MD;  Location: [facility];  Service: Cardiovascular;  Laterality: N/A;   PERIPHERAL VASCULAR CATHETERIZATION  06/01/2016   Procedure: Lower Extremity Intervention;  Surgeon: Selinda RAMAN Marea, MD;  Location: [facility];  Service: Cardiovascular;;   PERIPHERAL VASCULAR CATHETERIZATION Right 06/08/2016   Procedure: Lower Extremity Angiography;  Surgeon: Selinda RAMAN Marea, MD;  Location: [facility];  Service: Cardiovascular;  Laterality: Right;   PERIPHERAL VASCULAR CATHETERIZATION  06/08/2016   Procedure: Lower Extremity Intervention;  Surgeon: Selinda RAMAN Marea, MD;  Location: [facility];  Service: Cardiovascular;;   SUBMANDIBULAR GLAND EXCISION Left 12/06/2020   Procedure: EXCISION SUBMANDIBULAR GLAND;  Surgeon: Herminio Miu, MD;  Location: [facility];   Service: ENT;  Laterality: Left;  needs to be  first case Diabetic - diet controlled   TEMPORARY PACEMAKER N/A 07/11/2021   Procedure: TEMPORARY PACEMAKER;  Surgeon: Ammon Blunt, MD;  Location: [facility];  Service: Cardiovascular;  Laterality: N/A;    Family History  Problem Relation Age of Onset   Diabetes Mother    Hypercholesterolemia Mother    Lung cancer Father    Diabetes Sister    Diabetes Sister    Hypertension Sister    Diabetes Maternal Grandmother    Diabetes Paternal Grandmother    Heart attack Brother    Coronary artery disease Brother    Vascular Disease Brother    Heart attack Brother    Breast cancer Neg Hx     Allergies  Allergen Reactions   Iodinated Contrast Media Itching   Trelegy Ellipta  [Fluticasone -Umeclidin-Vilant]     Had breathing issues   Bactoshield Chg [Chlorhexidine  Gluconate] Itching and Other (See Comments)    Dry. Flaking, peeling skin       Latest Ref Rng & Units 01/30/2024    4:35 AM 01/29/2024    5:32 AM 01/27/2024   10:05 PM  CBC  WBC 4.0 - 10.5 K/uL 10.9  12.0  11.8   Hemoglobin 12.0 - 15.0 g/dL 9.4  9.0  89.8   Hematocrit 36.0 - 46.0 % 30.4  28.7  31.6   Platelets 150 - 400 K/uL 324  300  256       CMP     Component Value Date/Time   NA 137 01/30/2024 0435   NA 144 10/04/2023 1047   NA 141 04/17/2014 1055   K 4.6 01/30/2024 0435   K 3.9 04/17/2014 1055   CL 100 01/30/2024 0435   CL 102 04/17/2014 1055   CO2 32 01/30/2024 0435   CO2 30 04/17/2014 1055   GLUCOSE 228 (H) 01/30/2024 0435   GLUCOSE 201 (H) 04/17/2014 1055   BUN 22 01/30/2024 0435   BUN 12 10/04/2023 1047   BUN 10 04/17/2014 1055   CREATININE 0.69 01/30/2024 0435   CREATININE 0.84 07/06/2023 1016   CREATININE 0.90 04/17/2014 1055   CALCIUM  8.5 (L) 01/30/2024 0435   CALCIUM  9.1 04/17/2014 1055   PROT 7.1 01/17/2024 1208   PROT 6.9 10/04/2023 1047   PROT 8.1 04/17/2014 1055   ALBUMIN 2.8 (L) 01/23/2024 1228   ALBUMIN 4.4 10/04/2023  1047   ALBUMIN 4.1 04/17/2014 1055   AST 25 01/17/2024 1208   AST 24 07/06/2023 1016   ALT 20 01/17/2024 1208   ALT 27 07/06/2023 1016   ALT 29 04/17/2014 1055   ALKPHOS 58 01/17/2024 1208   ALKPHOS 73 04/17/2014 1055   BILITOT 0.3 01/17/2024 1208   BILITOT 0.2 10/04/2023 1047   BILITOT 0.4 07/06/2023 1016   EGFR 97 10/04/2023 1047   GFRNONAA >60 01/30/2024 0435   GFRNONAA >60 07/06/2023 1016   GFRNONAA >60 04/17/2014 1055     VAS US  ABI WITH/WO TBI Result Date: 01/06/2024  LOWER EXTREMITY DOPPLER STUDY Patient Name:  AALIYA MAULTSBY  Date of Exam:   01/05/2024 Medical Rec #: [id number]     Accession #:    [id number] Date of Birth: 05/02/1956     Patient Gender: F Patient Age:   60 years Exam Location:  [facility] Procedure:      VAS US  ABI WITH/WO TBI Referring Phys: SELINDA DEW --------------------------------------------------------------------------------  Indications: Claudication, and peripheral artery disease. Other Factors: Left SFA stents are known to be occluded.  Patient complains of constant numbness in left foot.                 07/29/2023: Aortogram and selective Left Lower                ExtremityAngiogram. Mechanical thrombectomy of the Left SFA and                Popliteal Artery with the [place] Rex device. Stent placement to the                Proximal Left SFA with 6 mm diameter by 5 cm length Viabahn                stent. Stent placement to the Left Popliteal Artery above the                knee with 6 mm diameter by 5 cm length Viabahn stent.                 12/20/2023: Aortogram and Selective Right Lower                ExtremityAngiogram. PTA of the Right SFA with 4 mm diameter by 22                cm length Lutonix drug coated angioplasty balloon. Stent                placement to the Right SFA with 6 mm by 20 cm length Life stent.  Vascular Interventions: 06/01/16: Left distal SFA/popliteal arteries PTAs with                         SFA stent x2;                          06/08/16: Left SFA PTA;                         09/29/18: Left SFA stent x2;. Comparison Study: 12/01/2023 Performing Technologist: Leafy Gibes RVS  Examination Guidelines: A complete evaluation includes at minimum, Doppler waveform signals and systolic blood pressure reading at the level of bilateral brachial, anterior tibial, and posterior tibial arteries, when vessel segments are accessible. Bilateral testing is considered an integral part of a complete examination. Photoelectric Plethysmograph (PPG) waveforms and toe systolic pressure readings are included as required and additional duplex testing as needed. Limited examinations for reoccurring indications may be performed as noted.  ABI Findings: +---------+------------------+-----+--------+--------+ Right    Rt Pressure (mmHg)IndexWaveformComment  +---------+------------------+-----+--------+--------+ Brachial 115                                     +---------+------------------+-----+--------+--------+ ATA      100               0.87 biphasic         +---------+------------------+-----+--------+--------+ PTA      105               0.91 biphasic         +---------+------------------+-----+--------+--------+ Great Toe108               0.94 Normal           +---------+------------------+-----+--------+--------+ +---------+------------------+-----+-------------------+-------+ Left     Lt Pressure (mmHg)IndexWaveform           Comment +---------+------------------+-----+-------------------+-------+  Brachial 114                                               +---------+------------------+-----+-------------------+-------+ ATA      43                0.37 dampened monophasic        +---------+------------------+-----+-------------------+-------+ PTA                             Non pulsatile flow         +---------+------------------+-----+-------------------+-------+ PERO     46                 0.40 dampened monophasic        +---------+------------------+-----+-------------------+-------+ Great Toe32                0.28                            +---------+------------------+-----+-------------------+-------+ +-------+-----------+-----------+------------+------------+ ABI/TBIToday's ABIToday's TBIPrevious ABIPrevious TBI +-------+-----------+-----------+------------+------------+ Right  .91        .94        .73         .85          +-------+-----------+-----------+------------+------------+ Left   .40        .28        .86         .94          +-------+-----------+-----------+------------+------------+ Right ABIs and TBIs appear increased compared to prior study on 12/01/2023. Left ABIs and TBIs appear decreased compared to prior study on 12/01/2023.  Summary: Right: Resting right ankle-brachial index indicates mild right lower extremity arterial disease. The right toe-brachial index is normal. Left: Resting left ankle-brachial index indicates severe left lower extremity arterial disease. The left toe-brachial index is abnormal. *See table(s) above for measurements and observations.  Electronically signed by Selinda Gu MD on 01/06/2024 at 8:12:02 AM.    Final        Assessment & Plan:   1. Peripheral arterial disease with history of revascularization (HCC) (Primary) Overall patient is doing well post endarterectomy.  She does have just a small area that is not quite a dehiscence but more so almost a skin tear.  Will have the patient placed Aquacel on this to be changed up to every other day.  Will plan on having her return in the next 2 weeks in order to evaluate the wound area.  Her studies today show increase in perfusion postintervention.   Current Outpatient Medications on File Prior to Visit  Medication Sig Dispense Refill   ACCU-CHEK GUIDE TEST test strip USE TO TEST BLOOD SUGAR 5 TIMES DAILY 450 each 0   Accu-Chek Softclix Lancets lancets Use 1 lancet 3  times daily to check glucose for diabetes 300 each 1   albuterol  (VENTOLIN  HFA) 108 (90 Base) MCG/ACT inhaler INHALE 2 PUFFS BY MOUTH EVERY 6 HOURS AS NEEDED FOR WHEEZING OR SHORTNESS OF BREATH 3 each 5   ALPRAZolam  (XANAX ) 0.5 MG tablet Take 1 tablet (0.5 mg total) by mouth 2 (two) times daily as needed for anxiety. 60 tablet 2   aspirin  EC 81 MG tablet Take 1 tablet (81 mg total) by mouth daily. Swallow whole. 150 tablet 2   atorvastatin  (LIPITOR)  10 MG tablet Take 1 tablet (10 mg total) by mouth daily. 30 tablet 11   Blood Glucose Monitoring Suppl (ACCU-CHEK GUIDE) w/Device KIT Use as directed Dx e11.65 1 kit 0   calcium  carbonate (OSCAL) 1500 (600 Ca) MG TABS tablet Take 600 mg of elemental calcium  by mouth 2 (two) times daily with a meal.     clopidogrel  (PLAVIX ) 75 MG tablet Take 1 tablet (75 mg total) by mouth daily. 90 tablet 3   Continuous Blood Gluc Receiver (DEXCOM G7 RECEIVER) DEVI Use one as directed for uncontrolled dm 1 each 0   Continuous Glucose Sensor (DEXCOM G7 SENSOR) MISC USE AS DIRECTED. CHANGE EVERY 10 DAYS. 3 each 0   cyclobenzaprine  (FLEXERIL ) 10 MG tablet Take 1 tablet (10 mg total) by mouth at bedtime. Take one tab po qhs for back spasm prn only 30 tablet 3   Dextromethorphan -guaiFENesin  20-200 MG/20ML LIQD Take 30 mLs by mouth every 4 (four) hours as needed. 118 mL 0   escitalopram  (LEXAPRO ) 10 MG tablet Take 1 tablet (10 mg total) by mouth daily. 90 tablet 1   famotidine  (PEPCID ) 20 MG tablet Take 1 tablet (20 mg total) by mouth daily for 14 days. (Patient taking differently: Take 20 mg by mouth daily as needed for heartburn.) 14 tablet 0   ferrous sulfate  325 (65 FE) MG tablet Take 325 mg by mouth 2 (two) times daily with a meal.     furosemide  (LASIX ) 20 MG tablet Take 1 tablet (20 mg total) by mouth daily as needed. 30 tablet 5   gabapentin  (NEURONTIN ) 300 MG capsule Take 1 capsule (300 mg total) by mouth 3 (three) times daily. 90 capsule 5   insulin  glargine, 1 Unit  Dial , (TOUJEO ) 300 UNIT/ML Solostar Pen Inject 10 Units into the skin daily. 1.5 mL 11   Insulin  Pen Needle (RELION PEN NEEDLES) 31G X 6 MM MISC USE 1 PEN NEEDLE WITH INSULIN  PEN ONCE DAILY FOR DIABETES 50 each 3   ipratropium-albuterol  (DUONEB) 0.5-2.5 (3) MG/3ML SOLN USE 1 AMPULE IN NEBULIZER EVERY 4 HOURS AS NEEDED 360 mL 0   losartan  (COZAAR ) 25 MG tablet Take 1 tablet (25 mg total) by mouth daily. 30 tablet 0   metFORMIN  (GLUCOPHAGE -XR) 500 MG 24 hr tablet TAKE 2 TABLETS BY MOUTH ONCE DAILY WITH BREAKFAST 180 tablet 0   metoprolol  succinate (TOPROL -XL) 50 MG 24 hr tablet Take 1 tablet (50 mg total) by mouth daily. Take with or immediately following a meal. 30 tablet 0   mometasone -formoterol  (DULERA ) 200-5 MCG/ACT AERO Inhale 2 puffs by mouth twice daily 13 g 12   oxyCODONE -acetaminophen  (PERCOCET/ROXICET) 5-325 MG tablet Take 1 tablet by mouth every 6 (six) hours as needed for severe pain (pain score 7-10). 30 tablet 0   OXYGEN  Inhale 4 L into the lungs. PT USES [facility] FOR OXYGEN      potassium chloride  (KLOR-CON ) 10 MEQ tablet TAKE 1 TABLET BY MOUTH EVERY OTHER DAY 45 tablet 0   SPIRIVA  HANDIHALER 18 MCG inhalation capsule INHALE THE CONTENTS OF 1 CAPSULES BY MOUTH ONCE DAILY - DO NOT SWALLOW CAPSULES 90 capsule 3   vitamin B-12 (CYANOCOBALAMIN ) 100 MCG tablet Take 100 mcg by mouth daily.     VITAMIN D , CHOLECALCIFEROL , PO Take 1 tablet by mouth in the morning.     No current facility-administered medications on file prior to visit.    There are no Patient Instructions on file for this visit. No follow-ups on file.   Jamaine Quintin E Mataya Kilduff,  NP

## 2024-02-22 ENCOUNTER — Telehealth: Payer: Self-pay | Admitting: Nurse Practitioner

## 2024-02-22 NOTE — Telephone Encounter (Addendum)
 Received 02/20/24 OT orders from [facility]. Gave to Alyssa-Toni    Signed & faxed back to [facility]; [phone]. Scanned-Toni

## 2024-02-24 DIAGNOSIS — Z95820 Peripheral vascular angioplasty status with implants and grafts: Secondary | ICD-10-CM | POA: Diagnosis not present

## 2024-02-24 DIAGNOSIS — Z79891 Long term (current) use of opiate analgesic: Secondary | ICD-10-CM | POA: Diagnosis not present

## 2024-02-24 DIAGNOSIS — Z79899 Other long term (current) drug therapy: Secondary | ICD-10-CM | POA: Diagnosis not present

## 2024-02-24 DIAGNOSIS — I110 Hypertensive heart disease with heart failure: Secondary | ICD-10-CM | POA: Diagnosis not present

## 2024-02-24 DIAGNOSIS — I5033 Acute on chronic diastolic (congestive) heart failure: Secondary | ICD-10-CM | POA: Diagnosis not present

## 2024-02-24 DIAGNOSIS — Z950 Presence of cardiac pacemaker: Secondary | ICD-10-CM | POA: Diagnosis not present

## 2024-02-24 DIAGNOSIS — E1151 Type 2 diabetes mellitus with diabetic peripheral angiopathy without gangrene: Secondary | ICD-10-CM | POA: Diagnosis not present

## 2024-02-24 DIAGNOSIS — E7800 Pure hypercholesterolemia, unspecified: Secondary | ICD-10-CM | POA: Diagnosis not present

## 2024-02-24 DIAGNOSIS — Z923 Personal history of irradiation: Secondary | ICD-10-CM | POA: Diagnosis not present

## 2024-02-24 DIAGNOSIS — J45909 Unspecified asthma, uncomplicated: Secondary | ICD-10-CM | POA: Diagnosis not present

## 2024-02-24 DIAGNOSIS — F17210 Nicotine dependence, cigarettes, uncomplicated: Secondary | ICD-10-CM | POA: Diagnosis not present

## 2024-02-24 DIAGNOSIS — Z7902 Long term (current) use of antithrombotics/antiplatelets: Secondary | ICD-10-CM | POA: Diagnosis not present

## 2024-02-24 DIAGNOSIS — J441 Chronic obstructive pulmonary disease with (acute) exacerbation: Secondary | ICD-10-CM | POA: Diagnosis not present

## 2024-02-24 DIAGNOSIS — C3431 Malignant neoplasm of lower lobe, right bronchus or lung: Secondary | ICD-10-CM | POA: Diagnosis not present

## 2024-02-24 DIAGNOSIS — G4730 Sleep apnea, unspecified: Secondary | ICD-10-CM | POA: Diagnosis not present

## 2024-02-24 DIAGNOSIS — Z9981 Dependence on supplemental oxygen: Secondary | ICD-10-CM | POA: Diagnosis not present

## 2024-02-24 DIAGNOSIS — I495 Sick sinus syndrome: Secondary | ICD-10-CM | POA: Diagnosis not present

## 2024-02-24 DIAGNOSIS — Z7982 Long term (current) use of aspirin: Secondary | ICD-10-CM | POA: Diagnosis not present

## 2024-02-24 DIAGNOSIS — J4489 Other specified chronic obstructive pulmonary disease: Secondary | ICD-10-CM | POA: Diagnosis not present

## 2024-02-24 DIAGNOSIS — I70222 Atherosclerosis of native arteries of extremities with rest pain, left leg: Secondary | ICD-10-CM | POA: Diagnosis not present

## 2024-02-24 LAB — VAS US ABI WITH/WO TBI
Left ABI: 0.92
Right ABI: 0.92

## 2024-02-25 ENCOUNTER — Encounter: Payer: Self-pay | Admitting: Oncology

## 2024-02-27 DIAGNOSIS — J9610 Chronic respiratory failure, unspecified whether with hypoxia or hypercapnia: Secondary | ICD-10-CM | POA: Diagnosis not present

## 2024-02-27 DIAGNOSIS — J449 Chronic obstructive pulmonary disease, unspecified: Secondary | ICD-10-CM | POA: Diagnosis not present

## 2024-02-28 DIAGNOSIS — Z79899 Other long term (current) drug therapy: Secondary | ICD-10-CM | POA: Diagnosis not present

## 2024-02-28 DIAGNOSIS — Z95820 Peripheral vascular angioplasty status with implants and grafts: Secondary | ICD-10-CM | POA: Diagnosis not present

## 2024-02-28 DIAGNOSIS — Z9981 Dependence on supplemental oxygen: Secondary | ICD-10-CM | POA: Diagnosis not present

## 2024-02-28 DIAGNOSIS — E7800 Pure hypercholesterolemia, unspecified: Secondary | ICD-10-CM | POA: Diagnosis not present

## 2024-02-28 DIAGNOSIS — I5033 Acute on chronic diastolic (congestive) heart failure: Secondary | ICD-10-CM | POA: Diagnosis not present

## 2024-02-28 DIAGNOSIS — F17210 Nicotine dependence, cigarettes, uncomplicated: Secondary | ICD-10-CM | POA: Diagnosis not present

## 2024-02-28 DIAGNOSIS — E1151 Type 2 diabetes mellitus with diabetic peripheral angiopathy without gangrene: Secondary | ICD-10-CM | POA: Diagnosis not present

## 2024-02-28 DIAGNOSIS — Z923 Personal history of irradiation: Secondary | ICD-10-CM | POA: Diagnosis not present

## 2024-02-28 DIAGNOSIS — J4489 Other specified chronic obstructive pulmonary disease: Secondary | ICD-10-CM | POA: Diagnosis not present

## 2024-02-28 DIAGNOSIS — Z950 Presence of cardiac pacemaker: Secondary | ICD-10-CM | POA: Diagnosis not present

## 2024-02-28 DIAGNOSIS — I495 Sick sinus syndrome: Secondary | ICD-10-CM | POA: Diagnosis not present

## 2024-02-28 DIAGNOSIS — Z7982 Long term (current) use of aspirin: Secondary | ICD-10-CM | POA: Diagnosis not present

## 2024-02-28 DIAGNOSIS — Z7902 Long term (current) use of antithrombotics/antiplatelets: Secondary | ICD-10-CM | POA: Diagnosis not present

## 2024-02-28 DIAGNOSIS — G4730 Sleep apnea, unspecified: Secondary | ICD-10-CM | POA: Diagnosis not present

## 2024-02-28 DIAGNOSIS — Z79891 Long term (current) use of opiate analgesic: Secondary | ICD-10-CM | POA: Diagnosis not present

## 2024-02-28 DIAGNOSIS — I70222 Atherosclerosis of native arteries of extremities with rest pain, left leg: Secondary | ICD-10-CM | POA: Diagnosis not present

## 2024-02-28 DIAGNOSIS — I110 Hypertensive heart disease with heart failure: Secondary | ICD-10-CM | POA: Diagnosis not present

## 2024-02-28 DIAGNOSIS — J45909 Unspecified asthma, uncomplicated: Secondary | ICD-10-CM | POA: Diagnosis not present

## 2024-02-28 DIAGNOSIS — C3431 Malignant neoplasm of lower lobe, right bronchus or lung: Secondary | ICD-10-CM | POA: Diagnosis not present

## 2024-03-01 ENCOUNTER — Telehealth: Payer: Self-pay

## 2024-03-01 MED ORDER — LOSARTAN POTASSIUM 25 MG PO TABS: 25.0000 mg | ORAL_TABLET | Freq: Every day | ORAL | 1 refills | Status: DC

## 2024-03-01 MED ORDER — METOPROLOL SUCCINATE ER 50 MG PO TB24
50.0000 mg | ORAL_TABLET | Freq: Every day | ORAL | 1 refills | Status: DC
Start: 2024-03-01 — End: 2024-04-21

## 2024-03-01 NOTE — Telephone Encounter (Signed)
 Pt advised that we sent both med

## 2024-03-02 ENCOUNTER — Telehealth: Payer: Self-pay | Admitting: Nurse Practitioner

## 2024-03-02 DIAGNOSIS — Z7982 Long term (current) use of aspirin: Secondary | ICD-10-CM | POA: Diagnosis not present

## 2024-03-02 DIAGNOSIS — I70222 Atherosclerosis of native arteries of extremities with rest pain, left leg: Secondary | ICD-10-CM | POA: Diagnosis not present

## 2024-03-02 DIAGNOSIS — J4489 Other specified chronic obstructive pulmonary disease: Secondary | ICD-10-CM | POA: Diagnosis not present

## 2024-03-02 DIAGNOSIS — I5033 Acute on chronic diastolic (congestive) heart failure: Secondary | ICD-10-CM | POA: Diagnosis not present

## 2024-03-02 DIAGNOSIS — G4730 Sleep apnea, unspecified: Secondary | ICD-10-CM | POA: Diagnosis not present

## 2024-03-02 DIAGNOSIS — I110 Hypertensive heart disease with heart failure: Secondary | ICD-10-CM | POA: Diagnosis not present

## 2024-03-02 DIAGNOSIS — Z7902 Long term (current) use of antithrombotics/antiplatelets: Secondary | ICD-10-CM | POA: Diagnosis not present

## 2024-03-02 DIAGNOSIS — Z95820 Peripheral vascular angioplasty status with implants and grafts: Secondary | ICD-10-CM | POA: Diagnosis not present

## 2024-03-02 DIAGNOSIS — J45909 Unspecified asthma, uncomplicated: Secondary | ICD-10-CM | POA: Diagnosis not present

## 2024-03-02 DIAGNOSIS — I495 Sick sinus syndrome: Secondary | ICD-10-CM | POA: Diagnosis not present

## 2024-03-02 DIAGNOSIS — Z79891 Long term (current) use of opiate analgesic: Secondary | ICD-10-CM | POA: Diagnosis not present

## 2024-03-02 DIAGNOSIS — Z9981 Dependence on supplemental oxygen: Secondary | ICD-10-CM | POA: Diagnosis not present

## 2024-03-02 DIAGNOSIS — E7800 Pure hypercholesterolemia, unspecified: Secondary | ICD-10-CM | POA: Diagnosis not present

## 2024-03-02 DIAGNOSIS — F17210 Nicotine dependence, cigarettes, uncomplicated: Secondary | ICD-10-CM | POA: Diagnosis not present

## 2024-03-02 DIAGNOSIS — Z79899 Other long term (current) drug therapy: Secondary | ICD-10-CM | POA: Diagnosis not present

## 2024-03-02 DIAGNOSIS — Z950 Presence of cardiac pacemaker: Secondary | ICD-10-CM | POA: Diagnosis not present

## 2024-03-02 DIAGNOSIS — Z923 Personal history of irradiation: Secondary | ICD-10-CM | POA: Diagnosis not present

## 2024-03-02 DIAGNOSIS — E1151 Type 2 diabetes mellitus with diabetic peripheral angiopathy without gangrene: Secondary | ICD-10-CM | POA: Diagnosis not present

## 2024-03-02 DIAGNOSIS — C3431 Malignant neoplasm of lower lobe, right bronchus or lung: Secondary | ICD-10-CM | POA: Diagnosis not present

## 2024-03-02 NOTE — Telephone Encounter (Signed)
 Received 02/04/24 PT orders from [facility]. Gave to Alyssa-Toni

## 2024-03-05 DIAGNOSIS — J449 Chronic obstructive pulmonary disease, unspecified: Secondary | ICD-10-CM | POA: Diagnosis not present

## 2024-03-05 DIAGNOSIS — I10 Essential (primary) hypertension: Secondary | ICD-10-CM | POA: Diagnosis not present

## 2024-03-05 DIAGNOSIS — R0602 Shortness of breath: Secondary | ICD-10-CM | POA: Diagnosis not present

## 2024-03-06 ENCOUNTER — Telehealth: Payer: Self-pay

## 2024-03-06 ENCOUNTER — Encounter (INDEPENDENT_AMBULATORY_CARE_PROVIDER_SITE_OTHER): Payer: Self-pay | Admitting: Nurse Practitioner

## 2024-03-06 ENCOUNTER — Other Ambulatory Visit: Payer: Self-pay

## 2024-03-06 ENCOUNTER — Ambulatory Visit (INDEPENDENT_AMBULATORY_CARE_PROVIDER_SITE_OTHER): Admitting: Nurse Practitioner

## 2024-03-06 DIAGNOSIS — I739 Peripheral vascular disease, unspecified: Secondary | ICD-10-CM

## 2024-03-06 DIAGNOSIS — M79605 Pain in left leg: Secondary | ICD-10-CM

## 2024-03-06 DIAGNOSIS — J449 Chronic obstructive pulmonary disease, unspecified: Secondary | ICD-10-CM

## 2024-03-06 DIAGNOSIS — Z09 Encounter for follow-up examination after completed treatment for conditions other than malignant neoplasm: Secondary | ICD-10-CM

## 2024-03-06 DIAGNOSIS — I70229 Atherosclerosis of native arteries of extremities with rest pain, unspecified extremity: Secondary | ICD-10-CM

## 2024-03-06 DIAGNOSIS — I998 Other disorder of circulatory system: Secondary | ICD-10-CM

## 2024-03-06 MED ORDER — IPRATROPIUM-ALBUTEROL 0.5-2.5 (3) MG/3ML IN SOLN: 3.0000 mL | Freq: Four times a day (QID) | RESPIRATORY_TRACT | 0 refills | Status: DC | PRN

## 2024-03-06 MED ORDER — PREGABALIN 75 MG PO CAPS: 75.0000 mg | ORAL_CAPSULE | Freq: Two times a day (BID) | ORAL | 2 refills | Status: DC

## 2024-03-06 NOTE — Progress Notes (Signed)
   03/06/2024  Patient ID: [name] Brain, female   DOB: 10/06/1955, 68 y.o.   MRN: [id number]  Patient was supposed to be followed up today for [employer] metric measure, but patient was seen by vascular. Will follow up later this week or next week.   Dorcas Solian, PharmD Clinical Pharmacist Cell: [phone]

## 2024-03-06 NOTE — Progress Notes (Signed)
 Subjective:    [name], female    DOB: April 15, 1956, 68 y.o.   MRN: [id number] Chief Complaint  Patient presents with   Fu 2-3 weeks no studies see FB    The patient returns to the office for followup and review status post left femoral endarterectomy with intervention on 01/21/2024.    PROCEDURE: 1. Left common femoral, superficial femoral and profunda femoris endarterectomy with bovine pericardial patch angioplasty. 2.    Stent placement to the left external iliac artery with 8 mm diameter by 7.5 cm length Viabahn stent 3.    Mechanical thrombectomy to the left SFA and popliteal arteries with the 8 [place] Rex device 4.   Stent placement to proximal left SFA with 7 mm diameter by 10 cm length Viabahn stent 5.  Stent placement to the left most distal SFA and popliteal artery with 6 mm diameter by 15 cm length Viabahn stent 6.   Angioplasty of the left tibioperoneal trunk and posterior tibial arteries with 2.5 mm diameter angioplasty balloon proximally and then the entirety of the left posterior tibial artery with 2 mm diameter angioplasty: 7.   Mechanical thrombectomy of the left tibioperoneal trunk and posterior tibial arteries with the penumbra 6 bolt device 8.  Instillation of 4 mg of tPA in the left posterior tibial artery distally for residual thrombus after above procedures   The patient notes improvement in the lower extremity symptoms. No interval shortening of the patient's claudication distance or rest pain symptoms. No new ulcers or wounds have occurred since the last visit.  She does still have some neuropathic pain going on in her left lower extremity however the patient's significant swelling.  She endorses continued to have some numbness and pain in her left lower extremity but the rest pain she has is gone.  The patient does have some underlying neuropathy.  Her wounds are completely healed today.    Review of Systems  Cardiovascular:  Positive for leg  swelling.       Home O2  Neurological:  Positive for numbness.  All other systems reviewed and are negative.      Objective:   Physical Exam Vitals reviewed.  HENT:     Head: Normocephalic.  Cardiovascular:     Rate and Rhythm: Normal rate.     Pulses:          Dorsalis pedis pulses are 2+ on the right side and 2+ on the left side.  Pulmonary:     Effort: Pulmonary effort is normal.  Skin:    General: Skin is warm and dry.  Neurological:     Mental Status: She is alert and oriented to person, place, and time.  Psychiatric:        Mood and Affect: Mood normal.        Behavior: Behavior normal.        Thought Content: Thought content normal.        Judgment: Judgment normal.     BP (!) 157/99   Pulse 86   Ht 4' 11 (1.499 m)   Wt 116 lb 6 oz (52.8 kg)   BMI 23.50 kg/m   Past Medical History:  Diagnosis Date   Anemia    Arthritis    Asthma    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/2016   Cancer (HCC) 2012   Right Lung CA   COPD (chronic obstructive pulmonary disease) (HCC)    Depression  Diabetes mellitus without complication [facility])    Patient takes Janumet   Essential hypertension 05/26/2016   Heart failure (HCC) 2022   Hydropneumothorax 05/03/2020   Hypercholesteremia    Oxygen  dependent    2L at nite    PAD (peripheral artery disease) (HCC) 06/22/2016   Peripheral vascular disease (HCC)    Personal history of radiation therapy    Shortness of breath dyspnea    with exertion    Sialolithiasis    Sleep apnea    Wears dentures    full upper and lower    Social History   Socioeconomic History   Marital status: Widowed    Spouse name: Not on file   Number of children: Not on file   Years of education: Not on file   Highest education level: Not on file  Occupational History   Not on file  Tobacco Use   Smoking status: Former    Current packs/day: 0.00    Average packs/day: 1 pack/day for 37.0  years (37.0 ttl pk-yrs)    Types: Cigarettes    Start date: 02/06/1973    Quit date: 02/06/2010    Years since quitting: 14.0   Smokeless tobacco: Former    Types: Snuff  Vaping Use   Vaping status: Never Used  Substance and Sexual Activity   Alcohol use: Not Currently    Alcohol/week: 5.0 standard drinks of alcohol    Types: 5 Cans of beer per week    Comment: /h x of alcohol abuse -stopped 2012- now drinks 5 beer per week   Drug use: Not Currently    Types: Marijuana, Crack cocaine, Cocaine    Comment: hx of cocaine use- last use 2015; last use marijuana6/22/19,   Sexual activity: Yes  Other Topics Concern   Not on file  Social History Narrative   Lives with Significant Other x 43 years   Social Drivers of Corporate investment banker Strain: Not on file  Food Insecurity: No Food Insecurity (01/17/2024)   Hunger Vital Sign    Worried About Running Out of Food in the Last Year: Never true    Ran Out of Food in the Last Year: Never true  Transportation Needs: No Transportation Needs (01/17/2024)   PRAPARE - Administrator, Civil Service (Medical): No    Lack of Transportation (Non-Medical): No  Physical Activity: Not on file  Stress: Not on file  Social Connections: Socially Integrated (01/17/2024)   Social Connection and Isolation Panel    Frequency of Communication with Friends and Family: More than three times a week    Frequency of Social Gatherings with Friends and Family: More than three times a week    Attends Religious Services: 1 to 4 times per year    Active Member of [employer] or Organizations: No    Attends Banker Meetings: 1 to 4 times per year    Marital Status: Married  Catering manager Violence: Not At Risk (01/17/2024)   Humiliation, Afraid, Rape, and Kick questionnaire    Fear of Current or Ex-Partner: No    Emotionally Abused: No    Physically Abused: No    Sexually Abused: No    Past Surgical History:  Procedure Laterality Date    APPLICATION OF CELL SAVER Left 01/21/2024   Procedure: APPLICATION OF CELL SAVER;  Surgeon: Marea Angela RAMAN, MD;  Location: [facility];  Service: Vascular;  Laterality: Left;   CESAREAN SECTION     x3  COLONOSCOPY WITH PROPOFOL  N/A 06/25/2015   Procedure: COLONOSCOPY WITH PROPOFOL ;  Surgeon: Rogelia Copping, MD;  Location: [facility];  Service: Endoscopy;  Laterality: N/A;   COLONOSCOPY WITH PROPOFOL  N/A 07/26/2020   Procedure: COLONOSCOPY WITH PROPOFOL ;  Surgeon: Copping Rogelia, MD;  Location: [facility];  Service: Endoscopy;  Laterality: N/A;   CYST REMOVAL LEG     and on shoulder    ENDARTERECTOMY FEMORAL Left 01/21/2024   Procedure: LEFT COMMON, SUPERFICIAL FEMORAL AND PROFUNDA FEMORIS ENDARTERECTOMY WITH BOVINE PERICARDIAL PATCH ANGIOPLASTY;  Surgeon: Marea Angela RAMAN, MD;  Location: [facility];  Service: Vascular;  Laterality: Left;   ESOPHAGOGASTRODUODENOSCOPY (EGD) WITH PROPOFOL  N/A 07/26/2020   Procedure: ESOPHAGOGASTRODUODENOSCOPY (EGD) WITH PROPOFOL ;  Surgeon: Copping Rogelia, MD;  Location: [facility];  Service: Endoscopy;  Laterality: N/A;  Diabetic - oral meds   INSERTION OF ILIAC STENT Left 01/21/2024   Procedure: INSERTION, STENT, ARTERY, ILIAC;  Surgeon: Marea Angela RAMAN, MD;  Location: [facility];  Service: Vascular;  Laterality: Left;  AND SFA STENTS   LOWER EXTREMITY ANGIOGRAPHY Left 09/29/2018   Procedure: LOWER EXTREMITY ANGIOGRAPHY;  Surgeon: Marea Angela RAMAN, MD;  Location: [facility];  Service: Cardiovascular;  Laterality: Left;   LOWER EXTREMITY ANGIOGRAPHY Left 07/29/2023   Procedure: Lower Extremity Angiography;  Surgeon: Marea Angela RAMAN, MD;  Location: [facility];  Service: Cardiovascular;  Laterality: Left;   LOWER EXTREMITY ANGIOGRAPHY Right 12/20/2023   Procedure: Lower Extremity Angiography;  Surgeon: Marea Angela RAMAN, MD;  Location: [facility];  Service: Cardiovascular;  Laterality: Right;   LOWER EXTREMITY ANGIOGRAPHY Left 01/17/2024   Procedure:  Lower Extremity Angiography;  Surgeon: Marea Angela RAMAN, MD;  Location: [facility];  Service: Cardiovascular;  Laterality: Left;   LUNG BIOPSY  12/30/2011   has lung spots   PACEMAKER IMPLANT  07/14/2021   PACEMAKER LEADLESS INSERTION N/A 07/14/2021   Procedure: PACEMAKER LEADLESS INSERTION;  Surgeon: Ammon Blunt, MD;  Location: [facility];  Service: Cardiovascular;  Laterality: N/A;   PERIPHERAL VASCULAR CATHETERIZATION Left 06/01/2016   Procedure: Lower Extremity Angiography;  Surgeon: Angela RAMAN Marea, MD;  Location: [facility];  Service: Cardiovascular;  Laterality: Left;   PERIPHERAL VASCULAR CATHETERIZATION N/A 06/01/2016   Procedure: Abdominal Aortogram w/Lower Extremity;  Surgeon: Angela RAMAN Marea, MD;  Location: [facility];  Service: Cardiovascular;  Laterality: N/A;   PERIPHERAL VASCULAR CATHETERIZATION  06/01/2016   Procedure: Lower Extremity Intervention;  Surgeon: Angela RAMAN Marea, MD;  Location: [facility];  Service: Cardiovascular;;   PERIPHERAL VASCULAR CATHETERIZATION Right 06/08/2016   Procedure: Lower Extremity Angiography;  Surgeon: Angela RAMAN Marea, MD;  Location: [facility];  Service: Cardiovascular;  Laterality: Right;   PERIPHERAL VASCULAR CATHETERIZATION  06/08/2016   Procedure: Lower Extremity Intervention;  Surgeon: Angela RAMAN Marea, MD;  Location: [facility];  Service: Cardiovascular;;   SUBMANDIBULAR GLAND EXCISION Left 12/06/2020   Procedure: EXCISION SUBMANDIBULAR GLAND;  Surgeon: Herminio Miu, MD;  Location: [facility];  Service: ENT;  Laterality: Left;  needs to be first case Diabetic - diet controlled   TEMPORARY PACEMAKER N/A 07/11/2021   Procedure: TEMPORARY PACEMAKER;  Surgeon: Ammon Blunt, MD;  Location: [facility];  Service: Cardiovascular;  Laterality: N/A;    Family History  Problem Relation Age of Onset   Diabetes Mother    Hypercholesterolemia Mother    Lung cancer  Father    Diabetes Sister  Diabetes Sister    Hypertension Sister    Diabetes Maternal Grandmother    Diabetes Paternal Grandmother    Heart attack Brother    Coronary artery disease Brother    Vascular Disease Brother    Heart attack Brother    Breast cancer Neg Hx     Allergies  Allergen Reactions   Iodinated Contrast Media Itching   Trelegy Ellipta  [Fluticasone -Umeclidin-Vilant]     Had breathing issues   Bactoshield Chg [Chlorhexidine  Gluconate] Itching and Other (See Comments)    Dry. Flaking, peeling skin       Latest Ref Rng & Units 01/30/2024    4:35 AM 01/29/2024    5:32 AM 01/27/2024   10:05 PM  CBC  WBC 4.0 - 10.5 K/uL 10.9  12.0  11.8   Hemoglobin 12.0 - 15.0 g/dL 9.4  9.0  89.8   Hematocrit 36.0 - 46.0 % 30.4  28.7  31.6   Platelets 150 - 400 K/uL 324  300  256       CMP     Component Value Date/Time   NA 137 01/30/2024 0435   NA 144 10/04/2023 1047   NA 141 04/17/2014 1055   K 4.6 01/30/2024 0435   K 3.9 04/17/2014 1055   CL 100 01/30/2024 0435   CL 102 04/17/2014 1055   CO2 32 01/30/2024 0435   CO2 30 04/17/2014 1055   GLUCOSE 228 (H) 01/30/2024 0435   GLUCOSE 201 (H) 04/17/2014 1055   BUN 22 01/30/2024 0435   BUN 12 10/04/2023 1047   BUN 10 04/17/2014 1055   CREATININE 0.69 01/30/2024 0435   CREATININE 0.84 07/06/2023 1016   CREATININE 0.90 04/17/2014 1055   CALCIUM  8.5 (L) 01/30/2024 0435   CALCIUM  9.1 04/17/2014 1055   PROT 7.1 01/17/2024 1208   PROT 6.9 10/04/2023 1047   PROT 8.1 04/17/2014 1055   ALBUMIN 2.8 (L) 01/23/2024 1228   ALBUMIN 4.4 10/04/2023 1047   ALBUMIN 4.1 04/17/2014 1055   AST 25 01/17/2024 1208   AST 24 07/06/2023 1016   ALT 20 01/17/2024 1208   ALT 27 07/06/2023 1016   ALT 29 04/17/2014 1055   ALKPHOS 58 01/17/2024 1208   ALKPHOS 73 04/17/2014 1055   BILITOT 0.3 01/17/2024 1208   BILITOT 0.2 10/04/2023 1047   BILITOT 0.4 07/06/2023 1016   EGFR 97 10/04/2023 1047   GFRNONAA >60 01/30/2024 0435   GFRNONAA  >60 07/06/2023 1016   GFRNONAA >60 04/17/2014 1055     VAS US  ABI WITH/WO TBI Result Date: 01/06/2024  LOWER EXTREMITY DOPPLER STUDY Patient Name:  Angela Jensen  Date of Exam:   01/05/2024 Medical Rec #: [id number]     Accession #:    [id number] Date of Birth: [birth year]     Patient Gender: F Patient Age:   62 years Exam Location:  [facility] Procedure:      VAS US  ABI WITH/WO TBI Referring Phys: Angela DEW --------------------------------------------------------------------------------  Indications: Claudication, and peripheral artery disease. Other Factors: Left SFA stents are known to be occluded.                Patient complains of constant numbness in left foot.                 07/29/2023: Aortogram and selective Left Lower                ExtremityAngiogram. Mechanical thrombectomy of the Left SFA and  Popliteal Artery with the [place] Rex device. Stent placement to the                Proximal Left SFA with 6 mm diameter by 5 cm length Viabahn                stent. Stent placement to the Left Popliteal Artery above the                knee with 6 mm diameter by 5 cm length Viabahn stent.                 12/20/2023: Aortogram and Selective Right Lower                ExtremityAngiogram. PTA of the Right SFA with 4 mm diameter by 22                cm length Lutonix drug coated angioplasty balloon. Stent                placement to the Right SFA with 6 mm by 20 cm length Life stent.  Vascular Interventions: 06/01/16: Left distal SFA/popliteal arteries PTAs with                         SFA stent x2;                         06/08/16: Left SFA PTA;                         09/29/18: Left SFA stent x2;. Comparison Study: 12/01/2023 Performing Technologist: Leafy Gibes RVS  Examination Guidelines: A complete evaluation includes at minimum, Doppler waveform signals and systolic blood pressure reading at the level of bilateral brachial, anterior tibial, and posterior tibial arteries, when  vessel segments are accessible. Bilateral testing is considered an integral part of a complete examination. Photoelectric Plethysmograph (PPG) waveforms and toe systolic pressure readings are included as required and additional duplex testing as needed. Limited examinations for reoccurring indications may be performed as noted.  ABI Findings: +---------+------------------+-----+--------+--------+ Right    Rt Pressure (mmHg)IndexWaveformComment  +---------+------------------+-----+--------+--------+ Brachial 115                                     +---------+------------------+-----+--------+--------+ ATA      100               0.87 biphasic         +---------+------------------+-----+--------+--------+ PTA      105               0.91 biphasic         +---------+------------------+-----+--------+--------+ Great Toe108               0.94 Normal           +---------+------------------+-----+--------+--------+ +---------+------------------+-----+-------------------+-------+ Left     Lt Pressure (mmHg)IndexWaveform           Comment +---------+------------------+-----+-------------------+-------+ Brachial 114                                               +---------+------------------+-----+-------------------+-------+ ATA      43  0.37 dampened monophasic        +---------+------------------+-----+-------------------+-------+ PTA                             Non pulsatile flow         +---------+------------------+-----+-------------------+-------+ PERO     46                0.40 dampened monophasic        +---------+------------------+-----+-------------------+-------+ Great Toe32                0.28                            +---------+------------------+-----+-------------------+-------+ +-------+-----------+-----------+------------+------------+ ABI/TBIToday's ABIToday's TBIPrevious ABIPrevious TBI  +-------+-----------+-----------+------------+------------+ Right  .91        .94        .73         .85          +-------+-----------+-----------+------------+------------+ Left   .40        .28        .86         .94          +-------+-----------+-----------+------------+------------+ Right ABIs and TBIs appear increased compared to prior study on 12/01/2023. Left ABIs and TBIs appear decreased compared to prior study on 12/01/2023.  Summary: Right: Resting right ankle-brachial index indicates mild right lower extremity arterial disease. The right toe-brachial index is normal. Left: Resting left ankle-brachial index indicates severe left lower extremity arterial disease. The left toe-brachial index is abnormal. *See table(s) above for measurements and observations.  Electronically signed by Angela Gu MD on 01/06/2024 at 8:12:02 AM.    Final        Assessment & Plan:   1. Peripheral arterial disease with history of revascularization (HCC) (Primary) Overall patient is doing well post endarterectomy.  The previous area that was open has healed.  Her ongoing pain I suspect are related to her neuropathy.  She was previously on gabapentin  however we will try her on Lyrica .  I have switched her to 75 mg twice daily.  I have advised her that if this is not helpful within a month or so to give us  a call and we will add an additional dosage per day.  Otherwise, she will return in 3 months with noninvasive studies.   Current Outpatient Medications on File Prior to Visit  Medication Sig Dispense Refill   ACCU-CHEK GUIDE TEST test strip USE TO TEST BLOOD SUGAR 5 TIMES DAILY 450 each 0   Accu-Chek Softclix Lancets lancets Use 1 lancet 3 times daily to check glucose for diabetes 300 each 1   albuterol  (VENTOLIN  HFA) 108 (90 Base) MCG/ACT inhaler INHALE 2 PUFFS BY MOUTH EVERY 6 HOURS AS NEEDED FOR WHEEZING OR SHORTNESS OF BREATH 3 each 5   ALPRAZolam  (XANAX ) 0.5 MG tablet Take 1 tablet (0.5 mg total) by  mouth 2 (two) times daily as needed for anxiety. 60 tablet 2   aspirin  EC 81 MG tablet Take 1 tablet (81 mg total) by mouth daily. Swallow whole. 150 tablet 2   atorvastatin  (LIPITOR) 10 MG tablet Take 1 tablet (10 mg total) by mouth daily. 30 tablet 11   Blood Glucose Monitoring Suppl (ACCU-CHEK GUIDE) w/Device KIT Use as directed Dx e11.65 1 kit 0   calcium  carbonate (OSCAL) 1500 (600 Ca) MG TABS tablet Take 600 mg of elemental calcium  by mouth 2 (  two) times daily with a meal.     clopidogrel  (PLAVIX ) 75 MG tablet Take 1 tablet (75 mg total) by mouth daily. 90 tablet 3   Continuous Blood Gluc Receiver (DEXCOM G7 RECEIVER) DEVI Use one as directed for uncontrolled dm 1 each 0   Continuous Glucose Sensor (DEXCOM G7 SENSOR) MISC USE AS DIRECTED. CHANGE EVERY 10 DAYS. 3 each 0   cyclobenzaprine  (FLEXERIL ) 10 MG tablet Take 1 tablet (10 mg total) by mouth at bedtime. Take one tab po qhs for back spasm prn only 30 tablet 3   Dextromethorphan -guaiFENesin  20-200 MG/20ML LIQD Take 30 mLs by mouth every 4 (four) hours as needed. 118 mL 0   escitalopram  (LEXAPRO ) 10 MG tablet Take 1 tablet (10 mg total) by mouth daily. 90 tablet 1   famotidine  (PEPCID ) 20 MG tablet Take 1 tablet (20 mg total) by mouth daily for 14 days. (Patient taking differently: Take 20 mg by mouth daily as needed for heartburn.) 14 tablet 0   ferrous sulfate  325 (65 FE) MG tablet Take 325 mg by mouth 2 (two) times daily with a meal.     furosemide  (LASIX ) 20 MG tablet Take 1 tablet (20 mg total) by mouth daily as needed. 30 tablet 5   insulin  glargine, 1 Unit Dial , (TOUJEO ) 300 UNIT/ML Solostar Pen Inject 10 Units into the skin daily. 1.5 mL 11   Insulin  Pen Needle (RELION PEN NEEDLES) 31G X 6 MM MISC USE 1 PEN NEEDLE WITH INSULIN  PEN ONCE DAILY FOR DIABETES 50 each 3   losartan  (COZAAR ) 25 MG tablet Take 1 tablet (25 mg total) by mouth daily. 90 tablet 1   metFORMIN  (GLUCOPHAGE -XR) 500 MG 24 hr tablet TAKE 2 TABLETS BY MOUTH ONCE DAILY  WITH BREAKFAST 180 tablet 0   metoprolol  succinate (TOPROL -XL) 50 MG 24 hr tablet Take 1 tablet (50 mg total) by mouth daily. Take with or immediately following a meal. 90 tablet 1   mometasone -formoterol  (DULERA ) 200-5 MCG/ACT AERO Inhale 2 puffs by mouth twice daily 13 g 12   oxyCODONE -acetaminophen  (PERCOCET/ROXICET) 5-325 MG tablet Take 1 tablet by mouth every 6 (six) hours as needed for severe pain (pain score 7-10). 30 tablet 0   OXYGEN  Inhale 4 L into the lungs. PT USES [facility] FOR OXYGEN      potassium chloride  (KLOR-CON ) 10 MEQ tablet TAKE 1 TABLET BY MOUTH EVERY OTHER DAY 45 tablet 0   SPIRIVA  HANDIHALER 18 MCG inhalation capsule INHALE THE CONTENTS OF 1 CAPSULES BY MOUTH ONCE DAILY - DO NOT SWALLOW CAPSULES 90 capsule 3   vitamin B-12 (CYANOCOBALAMIN ) 100 MCG tablet Take 100 mcg by mouth daily.     VITAMIN D , CHOLECALCIFEROL , PO Take 1 tablet by mouth in the morning.     No current facility-administered medications on file prior to visit.    There are no Patient Instructions on file for this visit. No follow-ups on file.   Yuridiana Formanek E Jewelene Mairena, NP

## 2024-03-07 DIAGNOSIS — Z7902 Long term (current) use of antithrombotics/antiplatelets: Secondary | ICD-10-CM | POA: Diagnosis not present

## 2024-03-07 DIAGNOSIS — Z79891 Long term (current) use of opiate analgesic: Secondary | ICD-10-CM | POA: Diagnosis not present

## 2024-03-07 DIAGNOSIS — Z9981 Dependence on supplemental oxygen: Secondary | ICD-10-CM | POA: Diagnosis not present

## 2024-03-07 DIAGNOSIS — F17210 Nicotine dependence, cigarettes, uncomplicated: Secondary | ICD-10-CM | POA: Diagnosis not present

## 2024-03-07 DIAGNOSIS — I70222 Atherosclerosis of native arteries of extremities with rest pain, left leg: Secondary | ICD-10-CM | POA: Diagnosis not present

## 2024-03-07 DIAGNOSIS — Z79899 Other long term (current) drug therapy: Secondary | ICD-10-CM | POA: Diagnosis not present

## 2024-03-07 DIAGNOSIS — G4730 Sleep apnea, unspecified: Secondary | ICD-10-CM | POA: Diagnosis not present

## 2024-03-07 DIAGNOSIS — Z950 Presence of cardiac pacemaker: Secondary | ICD-10-CM | POA: Diagnosis not present

## 2024-03-07 DIAGNOSIS — I495 Sick sinus syndrome: Secondary | ICD-10-CM | POA: Diagnosis not present

## 2024-03-07 DIAGNOSIS — C3431 Malignant neoplasm of lower lobe, right bronchus or lung: Secondary | ICD-10-CM | POA: Diagnosis not present

## 2024-03-07 DIAGNOSIS — E1151 Type 2 diabetes mellitus with diabetic peripheral angiopathy without gangrene: Secondary | ICD-10-CM | POA: Diagnosis not present

## 2024-03-07 DIAGNOSIS — I5033 Acute on chronic diastolic (congestive) heart failure: Secondary | ICD-10-CM | POA: Diagnosis not present

## 2024-03-07 DIAGNOSIS — Z7982 Long term (current) use of aspirin: Secondary | ICD-10-CM | POA: Diagnosis not present

## 2024-03-07 DIAGNOSIS — I110 Hypertensive heart disease with heart failure: Secondary | ICD-10-CM | POA: Diagnosis not present

## 2024-03-07 DIAGNOSIS — Z95820 Peripheral vascular angioplasty status with implants and grafts: Secondary | ICD-10-CM | POA: Diagnosis not present

## 2024-03-07 DIAGNOSIS — J45909 Unspecified asthma, uncomplicated: Secondary | ICD-10-CM | POA: Diagnosis not present

## 2024-03-07 DIAGNOSIS — E7800 Pure hypercholesterolemia, unspecified: Secondary | ICD-10-CM | POA: Diagnosis not present

## 2024-03-07 DIAGNOSIS — J4489 Other specified chronic obstructive pulmonary disease: Secondary | ICD-10-CM | POA: Diagnosis not present

## 2024-03-07 DIAGNOSIS — Z923 Personal history of irradiation: Secondary | ICD-10-CM | POA: Diagnosis not present

## 2024-03-08 DIAGNOSIS — J9611 Chronic respiratory failure with hypoxia: Secondary | ICD-10-CM | POA: Diagnosis not present

## 2024-03-08 DIAGNOSIS — J449 Chronic obstructive pulmonary disease, unspecified: Secondary | ICD-10-CM | POA: Diagnosis not present

## 2024-03-08 DIAGNOSIS — R911 Solitary pulmonary nodule: Secondary | ICD-10-CM | POA: Diagnosis not present

## 2024-03-08 DIAGNOSIS — Z87891 Personal history of nicotine dependence: Secondary | ICD-10-CM | POA: Diagnosis not present

## 2024-03-08 DIAGNOSIS — J701 Chronic and other pulmonary manifestations due to radiation: Secondary | ICD-10-CM | POA: Diagnosis not present

## 2024-03-13 ENCOUNTER — Other Ambulatory Visit: Payer: Self-pay

## 2024-03-13 NOTE — Progress Notes (Signed)
 03/13/2024 Name: Angela Jensen MRN: [id number] DOB: Nov 08, 1955  No chief complaint on file.   Angela Jensen is a 68 y.o. year old female who presented for a telephone visit.   They were referred to the pharmacist by a quality report for assistance in managing diabetes.    Subjective:  Care Team: Primary Care Provider: Liana Fish, NP ; Next Scheduled Visit: 04/05/2024   Medication Access/Adherence  Current Pharmacy:  [facility] [address] ([address]),  - [address] [place]) [place] [address] Phone: [phone] Fax: [phone]   Patient reports affordability concerns with their medications: No  Patient reports access/transportation concerns to their pharmacy: No  Patient reports adherence concerns with their medications:  No  Her spouse helps her remember to take medication.    Diabetes:  Current medications: Toujeo  10 units daily on most days (prescribed 10 units daily; also stated she takes 8-10 units during medication review depending on her blood glucose), metformin  XR 1000 mg daily Other: atorvastatin  10 mg daily(prior documentation of reported muscle pain while on Crestor ), aspirin  81 mg daily   Current glucose readings:  - Before Meals 125-140 - Post Meals 160-175 - Fasting 120-140 mg/dL; this morning it was 91 mg/dL Using BGM meter; testing 3-4 times daily   Patient denies hypoglycemic symptoms including dizziness, shakiness, sweating. Patient reports hyperglycemic symptoms including neuropathy (baseline) but feels that she has had some improvement.  Current meal patterns: Since last A1c she reports that she improved her diet - Breakfast: oatmeal (mostly) - Lunch sandwich (mostly)- 2 slices of bread, burger w/top and bottom buns, chicken salad - Supper chicken (air fried), pork chop (air fried), lambs (air fried), hamburger helper  - Snacks small oranges, small apples, plums, berries, crackers, oatmeal  cakes (occasionally), bananas (whole), sometimes grapes (rare), peaches - Drinks water  and diet/ sugar-free sodas, Gatorade (regular) and feels the diet tastes sweeter  Current physical activity: Exercise with physical therapist comes to home.   The 10-year ASCVD risk score (Arnett DK, et al., 2019) is: 26.6%   Values used to calculate the score:     Age: 19 years     Clincally relevant sex: Female     Is Non-Hispanic African American: Yes     Diabetic: Yes     Tobacco smoker: No     Systolic Blood Pressure: 157 mmHg     Is BP treated: Yes     HDL Cholesterol: 53 mg/dL     Total Cholesterol: 143 mg/dL    Objective:  Lab Results  Component Value Date   HGBA1C 8.0 (A) 01/12/2024    Lab Results  Component Value Date   CREATININE 0.69 01/30/2024   BUN 22 01/30/2024   NA 137 01/30/2024   K 4.6 01/30/2024   CL 100 01/30/2024   CO2 32 01/30/2024    Lab Results  Component Value Date   CHOL 143 10/04/2023   HDL 53 10/04/2023   LDLCALC 75 10/04/2023   TRIG 74 10/04/2023   CHOLHDL 2.7 10/04/2023    Medications Reviewed Today     Reviewed by Cleatus Dorcas SAUNDERS, RPH (Pharmacist) on 03/13/24 at 1239  Med List Status: <None>   Medication Order Taking? Sig Documenting Provider Last Dose Status Informant  ACCU-CHEK GUIDE TEST test strip [id number] Yes USE TO TEST BLOOD SUGAR 5 TIMES DAILY Abernathy, Alyssa, NP  Active   Accu-Chek Softclix Lancets lancets [id number] Yes Use 1 lancet 3 times daily to check glucose  for diabetes Liana Fish, NP  Active Other, Pharmacy Records  albuterol  (PROVENTIL ) (5 MG/ML) 0.5% nebulizer solution [id number] Yes Take 2.5 mg by nebulization every 6 (six) hours as needed for wheezing or shortness of breath. [provider]  Active   albuterol  (VENTOLIN  HFA) 108 (90 Base) MCG/ACT inhaler [id number] Yes INHALE 2 PUFFS BY MOUTH EVERY 6 HOURS AS NEEDED FOR WHEEZING OR SHORTNESS OF SHERIDA Liana, Alyssa, NP  Active   ALPRAZolam  (XANAX ) 0.5 MG  tablet [id number] Yes Take 1 tablet (0.5 mg total) by mouth 2 (two) times daily as needed for anxiety. Liana Fish, NP  Active   aspirin  EC 81 MG tablet [id number] Yes Take 1 tablet (81 mg total) by mouth daily. Swallow whole. Marea Selinda RAMAN, MD  Active   atorvastatin  (LIPITOR) 10 MG tablet [id number] Yes Take 1 tablet (10 mg total) by mouth daily. Marea Selinda RAMAN, MD  Active   Blood Glucose Monitoring Suppl (ACCU-CHEK GUIDE) w/Device PRESSLEY [id number] Yes Use as directed Dx e11.65 Khan, Fozia M, MD  Active Other, Pharmacy Records  calcium  carbonate (OSCAL) 1500 (600 Ca) MG TABS tablet [id number] Yes Take 600 mg of elemental calcium  by mouth 2 (two) times daily with a meal. [provider]  Active   clopidogrel  (PLAVIX ) 75 MG tablet [id number] Yes Take 1 tablet (75 mg total) by mouth daily. Liana Fish, NP  Active   Continuous Blood Gluc Receiver (DEXCOM G7 [place]) DEVI [id number]  Use one as directed for uncontrolled dm  Patient not taking: Reported on 03/13/2024   Liana Fish, NP  Consider Medication Status and Discontinue (Patient Preference) Other, Pharmacy Records  Continuous Glucose Sensor (DEXCOM G7 SENSOR) MISC [id number]  USE AS DIRECTED. CHANGE EVERY 10 DAYS.  Patient not taking: Reported on 03/13/2024   Liana Fish, NP  Consider Medication Status and Discontinue (Patient Preference)   cyclobenzaprine  (FLEXERIL ) 10 MG tablet [id number] Yes Take 1 tablet (10 mg total) by mouth at bedtime. Take one tab po qhs for back spasm prn only  Patient taking differently: Take 10 mg by mouth at bedtime as needed for muscle spasms. Take one tab po qhs for back spasm prn only   Liana Fish, NP  Active            Med Note MAYER, [facility] R   Thu Dec 23, 2023  2:40 PM) Taking prn  dexamethasone  (DECADRON ) 1 MG tablet [id number] Yes Take 1 mg by mouth. [provider]  Active   Dextromethorphan -guaiFENesin  20-200 MG/20ML LIQD [id number]  Take 30 mLs by mouth every 4 (four) hours  as needed.  Patient not taking: Reported on 03/13/2024   Patel, Sona, MD  Active   escitalopram  (LEXAPRO ) 10 MG tablet [id number] Yes Take 1 tablet (10 mg total) by mouth daily. Liana Fish, NP  Active   famotidine  (PEPCID ) 20 MG tablet [id number] Yes Take 1 tablet (20 mg total) by mouth daily for 14 days. Dickie Begun, MD  Active Other, Pharmacy Records  ferrous sulfate  325 [phone] FE) MG tablet [id number] Yes Take 325 mg by mouth 2 (two) times daily with a meal. [provider]  Active Other, Pharmacy Records  furosemide  (LASIX ) 20 MG tablet [id number] Yes Take 1 tablet (20 mg total) by mouth daily as needed. Liana Fish, NP  Active Other, Pharmacy Records  insulin  glargine, 1 Unit Dial , (TOUJEO ) 300 UNIT/ML Solostar Pen [id number] Yes Inject 10 Units into the skin daily. Liana Fish, NP  Active  Med Note MAYER, [facility] R   Thu Dec 23, 2023  2:45 PM) Taking differently: 8-10 units daily  Insulin  Pen Needle (RELION PEN NEEDLES) 31G X 6 MM MISC [id number] Yes USE 1 PEN NEEDLE WITH INSULIN  PEN ONCE DAILY FOR DIABETES Liana Fish, NP  Active Other, Pharmacy Records  ipratropium-albuterol  (DUONEB) 0.5-2.5 (3) MG/3ML SOLN [id number] Yes Take 3 mLs by nebulization every 6 (six) hours as needed. Liana Fish, NP  Active   losartan  (COZAAR ) 25 MG tablet [id number] Yes Take 1 tablet (25 mg total) by mouth daily. Liana Fish, NP  Active   metFORMIN  (GLUCOPHAGE -XR) 500 MG 24 hr tablet [id number] Yes TAKE 2 TABLETS BY MOUTH ONCE DAILY WITH BREAKFAST Abernathy, Alyssa, NP  Active   metoprolol  succinate (TOPROL -XL) 50 MG 24 hr tablet [id number] Yes Take 1 tablet (50 mg total) by mouth daily. Take with or immediately following a meal. Abernathy, Alyssa, NP  Active   mometasone -formoterol  (DULERA ) 200-5 MCG/ACT AERO [id number] Yes Inhale 2 puffs by mouth twice daily Khan, Fozia M, MD  Active   oxyCODONE -acetaminophen  (PERCOCET/ROXICET) 5-325 MG tablet [id number] Yes Take 1  tablet by mouth every 6 (six) hours as needed for severe pain (pain score 7-10). Liana Fish, NP  Active   OXYGEN  [id number] Yes Inhale 4 L into the lungs. PT USES [facility] FOR OXYGEN  [provider]  Active Other, Pharmacy Records           Med Note MAYER, [facility] R   Thu Dec 23, 2023  2:48 PM) Taking 3 L  potassium chloride  (KLOR-CON ) 10 MEQ tablet [id number] Yes TAKE 1 TABLET BY MOUTH EVERY OTHER DAY Abernathy, Alyssa, NP  Active   pregabalin  (LYRICA ) 75 MG capsule [id number] Yes Take 1 capsule (75 mg total) by mouth 2 (two) times daily. Brown, Fallon E, NP  Active   SPIRIVA  HANDIHALER 18 MCG inhalation capsule [id number] Yes INHALE THE CONTENTS OF 1 CAPSULES BY MOUTH ONCE DAILY - DO NOT SWALLOW CAPSULES McDonough, Lauren K, PA-C  Active   sulfamethoxazole -trimethoprim  (BACTRIM ) 400-80 MG tablet [id number] Yes Take by mouth. [provider]  Active   vitamin B-12 (CYANOCOBALAMIN ) 100 MCG tablet [id number] Yes Take 100 mcg by mouth daily. [provider]  Active   VITAMIN D , CHOLECALCIFEROL , PO [id number] Yes Take 1 tablet by mouth in the morning. [provider]  Active Other, Pharmacy Records, Self              Assessment/Plan:   Diabetes: - Currently uncontrolled per last hemoglobin A1c yet improved.  - Reviewed long term cardiovascular and renal outcomes of uncontrolled blood sugar - Reviewed goal A1c, goal fasting, and goal 2 hour post prandial glucose - Reviewed dietary modifications including Discussed continuing to work on lower glycemic index foods such as keto bread or low carb wraps  - Reviewed lifestyle modifications including: 150 min/week of intentional exercise such simple walks in neighborhood with great grandchild - Recommend to continue current regimen, but discussed concern about self adjusting concentrated insulin  to a range. May suggest future considerations for moderate intensity statin to safely improve LDL without side  effects, pending labs. - Recommend to check glucose four times daily with CGM (fasting, post prandial breakfast, lunch, dinner)       Follow Up Plan: PCP appointment on 04/05/2024, then PharmD telephone appointment 4 weeks after PCP appointment - Next refill due: metformin  1000 (around 04/12/2024) - prescription renewal needed  Dorcas Solian, PharmD Clinical Pharmacist Cell: [phone]

## 2024-03-14 ENCOUNTER — Telehealth: Payer: Self-pay | Admitting: Nurse Practitioner

## 2024-03-14 NOTE — Telephone Encounter (Signed)
 02/04/24 PT orders signed & faxed back to  [facility]; [phone]. Scanned-Toni

## 2024-03-16 ENCOUNTER — Telehealth: Payer: Self-pay

## 2024-03-16 DIAGNOSIS — I70229 Atherosclerosis of native arteries of extremities with rest pain, unspecified extremity: Secondary | ICD-10-CM

## 2024-03-16 DIAGNOSIS — I998 Other disorder of circulatory system: Secondary | ICD-10-CM

## 2024-03-16 DIAGNOSIS — M79605 Pain in left leg: Secondary | ICD-10-CM

## 2024-03-18 ENCOUNTER — Other Ambulatory Visit: Payer: Self-pay | Admitting: Nurse Practitioner

## 2024-03-18 DIAGNOSIS — Z79899 Other long term (current) drug therapy: Secondary | ICD-10-CM

## 2024-03-19 ENCOUNTER — Emergency Department

## 2024-03-19 ENCOUNTER — Other Ambulatory Visit: Payer: Self-pay

## 2024-03-19 ENCOUNTER — Inpatient Hospital Stay
Admission: EM | Admit: 2024-03-19 | Discharge: 2024-03-24 | DRG: 193 | Disposition: A | Discharge status: Home Health | Attending: Family Medicine | Admitting: Family Medicine

## 2024-03-19 DIAGNOSIS — E7800 Pure hypercholesterolemia, unspecified: Secondary | ICD-10-CM | POA: Diagnosis not present

## 2024-03-19 DIAGNOSIS — Z801 Family history of malignant neoplasm of trachea, bronchus and lung: Secondary | ICD-10-CM

## 2024-03-19 DIAGNOSIS — J9811 Atelectasis: Secondary | ICD-10-CM | POA: Diagnosis not present

## 2024-03-19 DIAGNOSIS — J440 Chronic obstructive pulmonary disease with acute lower respiratory infection: Secondary | ICD-10-CM | POA: Diagnosis present

## 2024-03-19 DIAGNOSIS — Z888 Allergy status to other drugs, medicaments and biological substances status: Secondary | ICD-10-CM

## 2024-03-19 DIAGNOSIS — E1151 Type 2 diabetes mellitus with diabetic peripheral angiopathy without gangrene: Secondary | ICD-10-CM | POA: Diagnosis not present

## 2024-03-19 DIAGNOSIS — J9622 Acute and chronic respiratory failure with hypercapnia: Secondary | ICD-10-CM | POA: Diagnosis not present

## 2024-03-19 DIAGNOSIS — M1990 Unspecified osteoarthritis, unspecified site: Secondary | ICD-10-CM | POA: Diagnosis present

## 2024-03-19 DIAGNOSIS — J189 Pneumonia, unspecified organism: Principal | ICD-10-CM | POA: Diagnosis present

## 2024-03-19 DIAGNOSIS — N179 Acute kidney failure, unspecified: Secondary | ICD-10-CM | POA: Diagnosis present

## 2024-03-19 DIAGNOSIS — Z83438 Family history of other disorder of lipoprotein metabolism and other lipidemia: Secondary | ICD-10-CM

## 2024-03-19 DIAGNOSIS — I495 Sick sinus syndrome: Secondary | ICD-10-CM | POA: Diagnosis present

## 2024-03-19 DIAGNOSIS — Z1152 Encounter for screening for COVID-19: Secondary | ICD-10-CM

## 2024-03-19 DIAGNOSIS — J9601 Acute respiratory failure with hypoxia: Secondary | ICD-10-CM

## 2024-03-19 DIAGNOSIS — G4730 Sleep apnea, unspecified: Secondary | ICD-10-CM | POA: Diagnosis present

## 2024-03-19 DIAGNOSIS — Z85118 Personal history of other malignant neoplasm of bronchus and lung: Secondary | ICD-10-CM

## 2024-03-19 DIAGNOSIS — C3431 Malignant neoplasm of lower lobe, right bronchus or lung: Secondary | ICD-10-CM | POA: Diagnosis not present

## 2024-03-19 DIAGNOSIS — F419 Anxiety disorder, unspecified: Secondary | ICD-10-CM | POA: Diagnosis present

## 2024-03-19 DIAGNOSIS — T380X5A Adverse effect of glucocorticoids and synthetic analogues, initial encounter: Secondary | ICD-10-CM | POA: Diagnosis present

## 2024-03-19 DIAGNOSIS — E1165 Type 2 diabetes mellitus with hyperglycemia: Secondary | ICD-10-CM | POA: Diagnosis present

## 2024-03-19 DIAGNOSIS — Z7951 Long term (current) use of inhaled steroids: Secondary | ICD-10-CM | POA: Diagnosis not present

## 2024-03-19 DIAGNOSIS — I110 Hypertensive heart disease with heart failure: Secondary | ICD-10-CM | POA: Diagnosis present

## 2024-03-19 DIAGNOSIS — R0602 Shortness of breath: Secondary | ICD-10-CM | POA: Diagnosis not present

## 2024-03-19 DIAGNOSIS — J90 Pleural effusion, not elsewhere classified: Secondary | ICD-10-CM | POA: Diagnosis not present

## 2024-03-19 DIAGNOSIS — Z7952 Long term (current) use of systemic steroids: Secondary | ICD-10-CM

## 2024-03-19 DIAGNOSIS — Z923 Personal history of irradiation: Secondary | ICD-10-CM

## 2024-03-19 DIAGNOSIS — Z7984 Long term (current) use of oral hypoglycemic drugs: Secondary | ICD-10-CM

## 2024-03-19 DIAGNOSIS — Z91041 Radiographic dye allergy status: Secondary | ICD-10-CM

## 2024-03-19 DIAGNOSIS — Z7902 Long term (current) use of antithrombotics/antiplatelets: Secondary | ICD-10-CM

## 2024-03-19 DIAGNOSIS — Z9981 Dependence on supplemental oxygen: Secondary | ICD-10-CM | POA: Diagnosis not present

## 2024-03-19 DIAGNOSIS — I5032 Chronic diastolic (congestive) heart failure: Secondary | ICD-10-CM | POA: Diagnosis not present

## 2024-03-19 DIAGNOSIS — R0689 Other abnormalities of breathing: Secondary | ICD-10-CM | POA: Diagnosis not present

## 2024-03-19 DIAGNOSIS — Z833 Family history of diabetes mellitus: Secondary | ICD-10-CM

## 2024-03-19 DIAGNOSIS — Z794 Long term (current) use of insulin: Secondary | ICD-10-CM

## 2024-03-19 DIAGNOSIS — R042 Hemoptysis: Secondary | ICD-10-CM | POA: Diagnosis present

## 2024-03-19 DIAGNOSIS — J9621 Acute and chronic respiratory failure with hypoxia: Secondary | ICD-10-CM | POA: Diagnosis present

## 2024-03-19 DIAGNOSIS — Z883 Allergy status to other anti-infective agents status: Secondary | ICD-10-CM

## 2024-03-19 DIAGNOSIS — J929 Pleural plaque without asbestos: Secondary | ICD-10-CM | POA: Diagnosis not present

## 2024-03-19 DIAGNOSIS — Z7982 Long term (current) use of aspirin: Secondary | ICD-10-CM

## 2024-03-19 DIAGNOSIS — J984 Other disorders of lung: Secondary | ICD-10-CM | POA: Diagnosis not present

## 2024-03-19 DIAGNOSIS — Z87891 Personal history of nicotine dependence: Secondary | ICD-10-CM

## 2024-03-19 DIAGNOSIS — Z8249 Family history of ischemic heart disease and other diseases of the circulatory system: Secondary | ICD-10-CM

## 2024-03-19 DIAGNOSIS — J432 Centrilobular emphysema: Secondary | ICD-10-CM | POA: Diagnosis not present

## 2024-03-19 DIAGNOSIS — R0603 Acute respiratory distress: Secondary | ICD-10-CM | POA: Diagnosis not present

## 2024-03-19 DIAGNOSIS — J441 Chronic obstructive pulmonary disease with (acute) exacerbation: Principal | ICD-10-CM | POA: Diagnosis present

## 2024-03-19 DIAGNOSIS — J939 Pneumothorax, unspecified: Secondary | ICD-10-CM | POA: Diagnosis not present

## 2024-03-19 DIAGNOSIS — F32A Depression, unspecified: Secondary | ICD-10-CM | POA: Diagnosis present

## 2024-03-19 DIAGNOSIS — Z79899 Other long term (current) drug therapy: Secondary | ICD-10-CM

## 2024-03-19 LAB — COMPREHENSIVE METABOLIC PANEL WITH GFR
ALT: 64 U/L — ABNORMAL HIGH (ref 0–44)
AST: 129 U/L — ABNORMAL HIGH (ref 15–41)
Albumin: 3.8 g/dL (ref 3.5–5.0)
Alkaline Phosphatase: 116 U/L (ref 38–126)
Anion gap: 12 (ref 5–15)
BUN: 16 mg/dL (ref 8–23)
CO2: 26 mmol/L (ref 22–32)
Calcium: 8.4 mg/dL — ABNORMAL LOW (ref 8.9–10.3)
Chloride: 102 mmol/L (ref 98–111)
Creatinine, Ser: 1.09 mg/dL — ABNORMAL HIGH (ref 0.44–1.00)
GFR, Estimated: 55 mL/min — ABNORMAL LOW (ref >60)
Glucose, Bld: 320 mg/dL — ABNORMAL HIGH (ref 70–99)
Potassium: 4.2 mmol/L (ref 3.5–5.1)
Sodium: 140 mmol/L (ref 135–145)
Total Bilirubin: 0.4 mg/dL (ref 0.0–1.2)
Total Protein: 7.3 g/dL (ref 6.5–8.1)

## 2024-03-19 LAB — CBC WITH DIFFERENTIAL/PLATELET
Abs Immature Granulocytes: 0.06 K/uL (ref 0.00–0.07)
Basophils Absolute: 0 K/uL (ref 0.0–0.1)
Basophils Relative: 0 %
Eosinophils Absolute: 0 K/uL (ref 0.0–0.5)
Eosinophils Relative: 0 %
HCT: 38.2 % (ref 36.0–46.0)
Hemoglobin: 11.2 g/dL — ABNORMAL LOW (ref 12.0–15.0)
Immature Granulocytes: 1 %
Lymphocytes Relative: 29 %
Lymphs Abs: 2.7 K/uL (ref 0.7–4.0)
MCH: 28.4 pg (ref 26.0–34.0)
MCHC: 29.3 g/dL — ABNORMAL LOW (ref 30.0–36.0)
MCV: 97 fL (ref 80.0–100.0)
Monocytes Absolute: 0.7 K/uL (ref 0.1–1.0)
Monocytes Relative: 7 %
Neutro Abs: 5.9 K/uL (ref 1.7–7.7)
Neutrophils Relative %: 63 %
Platelets: 270 K/uL (ref 150–400)
RBC: 3.94 MIL/uL (ref 3.87–5.11)
RDW: 14 % (ref 11.5–15.5)
WBC: 9.5 K/uL (ref 4.0–10.5)
nRBC: 0 % (ref 0.0–0.2)

## 2024-03-19 LAB — TROPONIN I (HIGH SENSITIVITY): Troponin I (High Sensitivity): 13 ng/L (ref <18)

## 2024-03-19 LAB — CBG MONITORING, ED: Glucose-Capillary: 292 mg/dL — ABNORMAL HIGH (ref 70–99)

## 2024-03-19 MED ORDER — SODIUM CHLORIDE 0.9 % IV SOLN
500.0000 mg | Freq: Once | INTRAVENOUS | Status: AC
  Administered 2024-03-20: 500 mg via INTRAVENOUS

## 2024-03-19 MED ORDER — IPRATROPIUM-ALBUTEROL 0.5-2.5 (3) MG/3ML IN SOLN
6.0000 mL | Freq: Once | RESPIRATORY_TRACT | Status: AC
  Administered 2024-03-19: 6 mL via RESPIRATORY_TRACT
  Filled 2024-03-19: qty 6

## 2024-03-19 MED ORDER — SODIUM CHLORIDE 0.9 % IV SOLN
1.0000 g | Freq: Once | INTRAVENOUS | Status: AC
  Administered 2024-03-19: 1 g via INTRAVENOUS
  Filled 2024-03-19: qty 10

## 2024-03-19 MED ORDER — MAGNESIUM SULFATE 2 GM/50ML IV SOLN
2.0000 g | Freq: Once | INTRAVENOUS | Status: AC
  Administered 2024-03-19: 2 g via INTRAVENOUS
  Filled 2024-03-19: qty 50

## 2024-03-19 NOTE — ED Provider Notes (Signed)
 [facility] Provider Note   Event Date/Time   First MD Initiated Contact with Patient 03/19/24 2237     (approximate) History  Respiratory Distress  HPI Angela Jensen is a 68 y.o. female with a past medical history of COPD, sick sinus syndrome, heart failure, and peripheral arterial disease who presents via EMS complaining of worsening shortness of breath.  Patient initial O2 sats in the 50s and improved to the low 90s on 4 L nasal cannula.  Patient had ipratropium/albuterol  as Jensen as 125 Solu-Medrol  IM prior to arrival with minimal decrease in patient's symptoms.  Further history and review of systems are unable to be obtained at this time secondary to patient severity ROS: Unable to assess   Physical Exam  Triage Vital Signs: ED Triage Vitals [03/19/24 2236]  Encounter Vitals Group     BP      Girls Systolic BP Percentile      Girls Diastolic BP Percentile      Boys Systolic BP Percentile      Boys Diastolic BP Percentile      Pulse      Resp      Temp      Temp src      SpO2 93 %     Weight      Height      Head Circumference      Peak Flow      Pain Score      Pain Loc      Pain Education      Exclude from Growth Chart    Most recent vital signs: Vitals:   03/19/24 2238 03/19/24 2248  BP: (!) 152/125   Pulse: 79   Resp: (!) 25   Temp:  (!) 97 F (36.1 C)  SpO2: 94%    General: Awake, oriented x4. CV:  Good peripheral perfusion. Resp:  Increased effort.  Mild end expiratory wheezes over bilateral lung fields with poor breath sounds bilaterally Abd:  No distention. Other:  Elderly overweight African-American female in moderate respiratory distress with BiPAP ED Results / Procedures / Treatments  Labs (all labs ordered are listed, but only abnormal results are displayed) Labs Reviewed  CBG MONITORING, ED - Abnormal; Notable for the following components:      Result Value   Glucose-Capillary 292 (*)    All other components within  normal limits  COMPREHENSIVE METABOLIC PANEL WITH GFR  CBC WITH DIFFERENTIAL/PLATELET  TROPONIN I (HIGH SENSITIVITY)   EKG ED ECG REPORT I, Artist MARLA Kerns, the attending physician, personally viewed and interpreted this ECG. Date: 03/19/2024 EKG Time: 2243 Rate: 76 Rhythm: Atrial fibrillation QRS Axis: normal Intervals: normal ST/T Wave abnormalities: normal Narrative Interpretation: Atrial fibrillation.  No evidence of acute ischemia RADIOLOGY ED MD interpretation: Pending - All radiology independently interpreted and agree with radiology assessment Official radiology report(s): No results found. PROCEDURES: Critical Care performed: Yes, see critical care procedure note(s) .1-3 Lead EKG Interpretation  Performed by: Kerns Artist MARLA, MD Authorized by: Kerns Artist MARLA, MD     Interpretation: abnormal     ECG rate:  71   ECG rate assessment: normal     Rhythm: atrial fibrillation     Ectopy: none     Conduction: normal   CRITICAL CARE Performed by: Sammi Stolarz K Illana Nolting  Total critical care time: 33 minutes  Critical care time was exclusive of separately billable procedures and treating other patients.  Critical care was necessary to treat or  prevent imminent or life-threatening deterioration.  Critical care was time spent personally by me on the following activities: development of treatment plan with patient and/or surrogate as Jensen as nursing, discussions with consultants, evaluation of patient's response to treatment, examination of patient, obtaining history from patient or surrogate, ordering and performing treatments and interventions, ordering and review of laboratory studies, ordering and review of radiographic studies, pulse oximetry and re-evaluation of patient's condition.  MEDICATIONS ORDERED IN ED: Medications  magnesium  sulfate IVPB 2 g 50 mL (2 g Intravenous New Bag/Given 03/19/24 2244)  ipratropium-albuterol  (DUONEB) 0.5-2.5 (3) MG/3ML nebulizer solution 6 mL (6  mLs Nebulization Given 03/19/24 2244)   IMPRESSION / MDM / ASSESSMENT AND PLAN / ED COURSE  I reviewed the triage vital signs and the nursing notes.                             The patient is on the cardiac monitor to evaluate for evidence of arrhythmia and/or significant heart rate changes. Patient's presentation is most consistent with acute presentation with potential threat to life or bodily function. The patient appears to be suffering from a moderate/severe exacerbation of COPD.  Based on the history, exam, CXR/EKG reviewed by me, and further workup I don't suspect any other emergent cause of this presentation, such as pneumonia, acute coronary syndrome, congestive heart failure, pulmonary embolism, or pneumothorax.  ED Interventions: bronchodilators, steroids, antibiotics, reassess  Reassessment: After treatment, the patient's shortness of breath is improving but patient is still requiring supplemental oxygenation with BiPAP  Disposition: Admit   FINAL CLINICAL IMPRESSION(S) / ED DIAGNOSES   Final diagnoses:  COPD exacerbation (HCC)  Acute respiratory failure with hypoxia (HCC)   Rx / DC Orders   ED Discharge Orders     None      Note:  This document was prepared using Dragon voice recognition software and may include unintentional dictation errors.   Trae Bovenzi K, MD 03/19/24 [phone]

## 2024-03-19 NOTE — ED Triage Notes (Signed)
 Patient brought in via [place] [facility] tonight from home with complaints of respiratory distress. Patient found gasping for breath, EMS reports initial sats in the 50s. Improved to low 90s on 4L nasal cannula. They administered breathing treatments without breathing relief.

## 2024-03-19 NOTE — H&P (Signed)
 History and Physical    Patient: Angela Jensen FMW:[id number] DOB: 1956/04/17 DOA: 03/19/2024 DOS: the patient was seen and examined on 03/19/2024 PCP: Liana Fish, NP  Patient coming from: Home  Chief Complaint:  Chief Complaint  Patient presents with   Respiratory Distress    HPI: Angela Jensen is a 68 y.o. female with medical history significant for COPD on 4 L nasal cannula at baseline, right lung cancer, s/p radiation therapy, recurrent right pneumothorax x 2 most recently in August 2024  as well as history of CHF (EF 50% 04/2022), HTN, DM and PAD, being admitted with a COPD exacerbation requiring BiPAP.  On arrival of EMS to the home she was apparently on room air and saturating in the 50s.  She reports recent fevers and cough.  She denies chest pain, lower extremity pain or swelling. In the ED, afebrile, breathing in the 20s with O2 sat mid 90s on BiPAP. CBC unremarkable.  CMP notable for glucose of 320 and creatinine 1.09 above baseline of 0.68.  Troponin 13.  Respiratory viral panel pending. EKG showing questionable A-fib. Chest x-ray Mild left basilar and mild to moderate severity right basilar airspace disease. Patient started on Rocephin  and azithromycin  and magnesium .   Admission requested.     Past Medical History:  Diagnosis Date   Anemia    Arthritis    Asthma    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/2016   Cancer (HCC) 2012   Right Lung CA   COPD (chronic obstructive pulmonary disease) (HCC)    Depression    Diabetes mellitus without complication (HCC)    Patient takes Janumet   Essential hypertension 05/26/2016   Heart failure (HCC) 2022   Hydropneumothorax 05/03/2020   Hypercholesteremia    Oxygen  dependent    2L at nite    PAD (peripheral artery disease) (HCC) 06/22/2016   Peripheral vascular disease (HCC)    Personal history of radiation therapy    Shortness of breath dyspnea    with exertion     Sialolithiasis    Sleep apnea    Wears dentures    full upper and lower   Past Surgical History:  Procedure Laterality Date   APPLICATION OF CELL SAVER Left 01/21/2024   Procedure: APPLICATION OF CELL SAVER;  Surgeon: Marea Selinda RAMAN, MD;  Location: [facility];  Service: Vascular;  Laterality: Left;   CESAREAN SECTION     x3   COLONOSCOPY WITH PROPOFOL  N/A 06/25/2015   Procedure: COLONOSCOPY WITH PROPOFOL ;  Surgeon: Rogelia Copping, MD;  Location: [facility];  Service: Endoscopy;  Laterality: N/A;   COLONOSCOPY WITH PROPOFOL  N/A 07/26/2020   Procedure: COLONOSCOPY WITH PROPOFOL ;  Surgeon: Copping Rogelia, MD;  Location: [facility];  Service: Endoscopy;  Laterality: N/A;   CYST REMOVAL LEG     and on shoulder    ENDARTERECTOMY FEMORAL Left 01/21/2024   Procedure: LEFT COMMON, SUPERFICIAL FEMORAL AND PROFUNDA FEMORIS ENDARTERECTOMY WITH BOVINE PERICARDIAL PATCH ANGIOPLASTY;  Surgeon: Marea Selinda RAMAN, MD;  Location: [facility];  Service: Vascular;  Laterality: Left;   ESOPHAGOGASTRODUODENOSCOPY (EGD) WITH PROPOFOL  N/A 07/26/2020   Procedure: ESOPHAGOGASTRODUODENOSCOPY (EGD) WITH PROPOFOL ;  Surgeon: Copping Rogelia, MD;  Location: [facility];  Service: Endoscopy;  Laterality: N/A;  Diabetic - oral meds   INSERTION OF ILIAC STENT Left 01/21/2024   Procedure: INSERTION, STENT, ARTERY, ILIAC;  Surgeon: Marea Selinda RAMAN, MD;  Location: [facility];  Service: Vascular;  Laterality: Left;  AND SFA STENTS  LOWER EXTREMITY ANGIOGRAPHY Left 09/29/2018   Procedure: LOWER EXTREMITY ANGIOGRAPHY;  Surgeon: Marea Selinda RAMAN, MD;  Location: [facility];  Service: Cardiovascular;  Laterality: Left;   LOWER EXTREMITY ANGIOGRAPHY Left 07/29/2023   Procedure: Lower Extremity Angiography;  Surgeon: Marea Selinda RAMAN, MD;  Location: [facility];  Service: Cardiovascular;  Laterality: Left;   LOWER EXTREMITY ANGIOGRAPHY Right 12/20/2023   Procedure: Lower Extremity Angiography;  Surgeon: Marea Selinda RAMAN, MD;   Location: [facility];  Service: Cardiovascular;  Laterality: Right;   LOWER EXTREMITY ANGIOGRAPHY Left 01/17/2024   Procedure: Lower Extremity Angiography;  Surgeon: Marea Selinda RAMAN, MD;  Location: [facility];  Service: Cardiovascular;  Laterality: Left;   LUNG BIOPSY  12/30/2011   has lung spots   PACEMAKER IMPLANT  07/14/2021   PACEMAKER LEADLESS INSERTION N/A 07/14/2021   Procedure: PACEMAKER LEADLESS INSERTION;  Surgeon: Ammon Blunt, MD;  Location: [facility];  Service: Cardiovascular;  Laterality: N/A;   PERIPHERAL VASCULAR CATHETERIZATION Left 06/01/2016   Procedure: Lower Extremity Angiography;  Surgeon: Selinda RAMAN Marea, MD;  Location: [facility];  Service: Cardiovascular;  Laterality: Left;   PERIPHERAL VASCULAR CATHETERIZATION N/A 06/01/2016   Procedure: Abdominal Aortogram w/Lower Extremity;  Surgeon: Selinda RAMAN Marea, MD;  Location: [facility];  Service: Cardiovascular;  Laterality: N/A;   PERIPHERAL VASCULAR CATHETERIZATION  06/01/2016   Procedure: Lower Extremity Intervention;  Surgeon: Selinda RAMAN Marea, MD;  Location: [facility];  Service: Cardiovascular;;   PERIPHERAL VASCULAR CATHETERIZATION Right 06/08/2016   Procedure: Lower Extremity Angiography;  Surgeon: Selinda RAMAN Marea, MD;  Location: [facility];  Service: Cardiovascular;  Laterality: Right;   PERIPHERAL VASCULAR CATHETERIZATION  06/08/2016   Procedure: Lower Extremity Intervention;  Surgeon: Selinda RAMAN Marea, MD;  Location: [facility];  Service: Cardiovascular;;   SUBMANDIBULAR GLAND EXCISION Left 12/06/2020   Procedure: EXCISION SUBMANDIBULAR GLAND;  Surgeon: Herminio Miu, MD;  Location: [facility];  Service: ENT;  Laterality: Left;  needs to be first case Diabetic - diet controlled   TEMPORARY PACEMAKER N/A 07/11/2021   Procedure: TEMPORARY PACEMAKER;  Surgeon: Ammon Blunt, MD;  Location: [facility];  Service:  Cardiovascular;  Laterality: N/A;   Social History:  reports that she quit smoking about 14 years ago. Her smoking use included cigarettes. She started smoking about 51 years ago. She has a 37 pack-year smoking history. She has quit using smokeless tobacco.  Her smokeless tobacco use included snuff. She reports that she does not currently use alcohol after a past usage of about 5.0 standard drinks of alcohol per week. She reports that she does not currently use drugs after having used the following drugs: Marijuana, Crack cocaine, and Cocaine.  Allergies  Allergen Reactions   Iodinated Contrast Media Itching   Trelegy Ellipta  [Fluticasone -Umeclidin-Vilant]     Had breathing issues   Bactoshield Chg [Chlorhexidine  Gluconate] Itching and Other (See Comments)    Dry. Flaking, peeling skin    Family History  Problem Relation Age of Onset   Diabetes Mother    Hypercholesterolemia Mother    Lung cancer Father    Diabetes Sister    Diabetes Sister    Hypertension Sister    Diabetes Maternal Grandmother    Diabetes Paternal Grandmother    Heart attack Brother    Coronary artery disease Brother    Vascular Disease Brother    Heart attack Brother    Breast cancer  Neg Hx     Prior to Admission medications   Medication Sig Start Date End Date Taking? Authorizing Provider  ACCU-CHEK GUIDE TEST test strip USE TO TEST BLOOD SUGAR 5 TIMES DAILY 09/21/23   Liana Fish, NP  Accu-Chek Softclix Lancets lancets Use 1 lancet 3 times daily to check glucose for diabetes 01/19/23   Liana Fish, NP  albuterol  (PROVENTIL ) (5 MG/ML) 0.5% nebulizer solution Take 2.5 mg by nebulization every 6 (six) hours as needed for wheezing or shortness of breath.    [provider]  albuterol  (VENTOLIN  HFA) 108 (90 Base) MCG/ACT inhaler INHALE 2 PUFFS BY MOUTH EVERY 6 HOURS AS NEEDED FOR WHEEZING OR SHORTNESS OF BREATH 10/13/23   Liana Fish, NP  ALPRAZolam  (XANAX ) 0.5 MG tablet Take 1 tablet (0.5  mg total) by mouth 2 (two) times daily as needed for anxiety. 01/12/24   Liana Fish, NP  aspirin  EC 81 MG tablet Take 1 tablet (81 mg total) by mouth daily. Swallow whole. 07/29/23 07/28/24  Marea Selinda RAMAN, MD  atorvastatin  (LIPITOR) 10 MG tablet Take 1 tablet (10 mg total) by mouth daily. 07/29/23 07/28/24  Marea Selinda RAMAN, MD  Blood Glucose Monitoring Suppl (ACCU-CHEK GUIDE) w/Device KIT Use as directed Dx e11.65 04/11/21   Khan, Fozia M, MD  calcium  carbonate (OSCAL) 1500 (600 Ca) MG TABS tablet Take 600 mg of elemental calcium  by mouth 2 (two) times daily with a meal.    [provider]  clopidogrel  (PLAVIX ) 75 MG tablet Take 1 tablet (75 mg total) by mouth daily. 10/13/23   Liana Fish, NP  Continuous Blood Gluc Receiver (DEXCOM G7 RECEIVER) DEVI Use one as directed for uncontrolled dm Patient not taking: Reported on 03/13/2024 06/09/22   Liana Fish, NP  Continuous Glucose Sensor (DEXCOM G7 SENSOR) MISC USE AS DIRECTED. CHANGE EVERY 10 DAYS. Patient not taking: Reported on 03/13/2024 09/21/23   Liana Fish, NP  cyclobenzaprine  (FLEXERIL ) 10 MG tablet Take 1 tablet (10 mg total) by mouth at bedtime. Take one tab po qhs for back spasm prn only Patient taking differently: Take 10 mg by mouth at bedtime as needed for muscle spasms. Take one tab po qhs for back spasm prn only 10/13/23   Liana Fish, NP  dexamethasone  (DECADRON ) 1 MG tablet Take 1 mg by mouth. 03/08/24 06/06/24  [provider]  Dextromethorphan -guaiFENesin  20-200 MG/20ML LIQD Take 30 mLs by mouth every 4 (four) hours as needed. Patient not taking: Reported on 03/13/2024 01/31/24   Patel, Sona, MD  escitalopram  (LEXAPRO ) 10 MG tablet Take 1 tablet (10 mg total) by mouth daily. 08/12/23   Liana Fish, NP  famotidine  (PEPCID ) 20 MG tablet Take 1 tablet (20 mg total) by mouth daily for 14 days. 06/14/21 03/13/24  Dickie Begun, MD  ferrous sulfate  325 (65 FE) MG tablet Take 325 mg by mouth 2 (two)  times daily with a meal.    [provider]  furosemide  (LASIX ) 20 MG tablet Take 1 tablet (20 mg total) by mouth daily as needed. 04/27/23   Liana Fish, NP  insulin  glargine, 1 Unit Dial , (TOUJEO ) 300 UNIT/ML Solostar Pen Inject 10 Units into the skin daily. 07/27/23   Liana Fish, NP  Insulin  Pen Needle (RELION PEN NEEDLES) 31G X 6 MM MISC USE 1 PEN NEEDLE WITH INSULIN  PEN ONCE DAILY FOR DIABETES 05/05/23   Liana Fish, NP  ipratropium-albuterol  (DUONEB) 0.5-2.5 (3) MG/3ML SOLN Take 3 mLs by nebulization every 6 (six) hours as needed. 03/06/24   Abernathy,  Alyssa, NP  losartan  (COZAAR ) 25 MG tablet Take 1 tablet (25 mg total) by mouth daily. 03/01/24   Abernathy, Alyssa, NP  metFORMIN  (GLUCOPHAGE -XR) 500 MG 24 hr tablet TAKE 2 TABLETS BY MOUTH ONCE DAILY WITH BREAKFAST 01/17/24   Abernathy, Alyssa, NP  metoprolol  succinate (TOPROL -XL) 50 MG 24 hr tablet Take 1 tablet (50 mg total) by mouth daily. Take with or immediately following a meal. 03/01/24   Liana Fish, NP  mometasone -formoterol  (DULERA ) 200-5 MCG/ACT AERO Inhale 2 puffs by mouth twice daily 07/18/23   Khan, Fozia M, MD  oxyCODONE -acetaminophen  (PERCOCET/ROXICET) 5-325 MG tablet Take 1 tablet by mouth every 6 (six) hours as needed for severe pain (pain score 7-10). 02/07/24   Liana Fish, NP  OXYGEN  Inhale 4 L into the lungs. PT USES [facility] FOR OXYGEN     [provider]  potassium chloride  (KLOR-CON ) 10 MEQ tablet TAKE 1 TABLET BY MOUTH EVERY OTHER DAY 12/14/23   Liana Fish, NP  pregabalin  (LYRICA ) 75 MG capsule Take 1 capsule (75 mg total) by mouth 2 (two) times daily. 03/06/24   Brown, Fallon E, NP  SPIRIVA  HANDIHALER 18 MCG inhalation capsule INHALE THE CONTENTS OF 1 CAPSULES BY MOUTH ONCE DAILY - DO NOT SWALLOW CAPSULES 06/28/23   McDonough, Tinnie POUR, PA-C  sulfamethoxazole -trimethoprim  (BACTRIM ) 400-80 MG tablet Take by mouth. 03/10/24 04/18/24  [provider]  vitamin B-12  (CYANOCOBALAMIN ) 100 MCG tablet Take 100 mcg by mouth daily.    [provider]  VITAMIN D , CHOLECALCIFEROL , PO Take 1 tablet by mouth in the morning.    [provider]    Physical Exam: Vitals:   03/19/24 2238 03/19/24 2239 03/19/24 2248 03/19/24 2300  BP: (!) 152/125   114/74  Pulse: 79   67  Resp: (!) 25   (!) 22  Temp:   (!) 97 F (36.1 C)   TempSrc:   Axillary   SpO2: 94%   99%  Weight:  55 kg    Height:  4' 11 (1.499 m)     Physical Exam Vitals and nursing note reviewed.  Constitutional:      General: She is not in acute distress. HENT:     Head: Normocephalic and atraumatic.  Cardiovascular:     Rate and Rhythm: Normal rate and regular rhythm.     Heart sounds: Normal heart sounds.  Pulmonary:     Effort: Tachypnea present.     Breath sounds: Decreased air movement present. Wheezing present.  Abdominal:     Palpations: Abdomen is soft.     Tenderness: There is no abdominal tenderness.  Neurological:     Mental Status: Mental status is at baseline.     Labs on Admission: I have personally reviewed following labs and imaging studies  CBC: Recent Labs  Lab 03/19/24 2242  WBC 9.5  NEUTROABS 5.9  HGB 11.2*  HCT 38.2  MCV 97.0  PLT 270   Basic Metabolic Panel: Recent Labs  Lab 03/19/24 2242  NA 140  K 4.2  CL 102  CO2 26  GLUCOSE 320*  BUN 16  CREATININE 1.09*  CALCIUM  8.4*   GFR: Estimated Creatinine Clearance: 37.4 mL/min (A) (by C-G formula based on SCr of 1.09 mg/dL (H)). Liver Function Tests: Recent Labs  Lab 03/19/24 2242  AST 129*  ALT 64*  ALKPHOS 116  BILITOT 0.4  PROT 7.3  ALBUMIN 3.8   No results for input(s): LIPASE, AMYLASE in the last 168 hours. No results for input(s): AMMONIA  in the last 168 hours. Coagulation Profile: No results for input(s): INR, PROTIME in the last 168 hours. Cardiac Enzymes: No results for input(s): CKTOTAL, CKMB, CKMBINDEX, TROPONINI in the last 168 hours. BNP  (last 3 results) No results for input(s): PROBNP in the last 8760 hours. HbA1C: No results for input(s): HGBA1C in the last 72 hours. CBG: Recent Labs  Lab 03/19/24 2247  GLUCAP 292*   Lipid Profile: No results for input(s): CHOL, HDL, LDLCALC, TRIG, CHOLHDL, LDLDIRECT in the last 72 hours. Thyroid  Function Tests: No results for input(s): TSH, T4TOTAL, FREET4, T3FREE, THYROIDAB in the last 72 hours. Anemia Panel: No results for input(s): VITAMINB12, FOLATE, FERRITIN, TIBC, IRON , RETICCTPCT in the last 72 hours. Urine analysis:    Component Value Date/Time   COLORURINE STRAW (A) 04/17/2023 0930   APPEARANCEUR CLEAR (A) 04/17/2023 0930   APPEARANCEUR Clear 08/06/2022 1541   LABSPEC 1.014 04/17/2023 0930   PHURINE 6.0 04/17/2023 0930   GLUCOSEU NEGATIVE 04/17/2023 0930   HGBUR MODERATE (A) 04/17/2023 0930   BILIRUBINUR NEGATIVE 04/17/2023 0930   BILIRUBINUR Negative 08/06/2022 1541   KETONESUR NEGATIVE 04/17/2023 0930   PROTEINUR NEGATIVE 04/17/2023 0930   UROBILINOGEN 0.2 04/22/2021 0959   NITRITE NEGATIVE 04/17/2023 0930   LEUKOCYTESUR NEGATIVE 04/17/2023 0930    Radiological Exams on Admission: DG Chest Port 1 View Result Date: 03/19/2024 CLINICAL DATA:  Respiratory distress peer EXAM: PORTABLE CHEST 1 VIEW COMPARISON:  January 29, 2024 FINDINGS: The heart size and mediastinal contours are within normal limits. There is evidence of emphysematous lung disease. Mild left basilar and mild to moderate severity right basilar airspace disease is noted. There is a small right pleural effusion. No pneumothorax is identified. The visualized skeletal structures are unremarkable. IMPRESSION: 1. Mild left basilar and mild to moderate severity right basilar airspace disease. 2. Small right pleural effusion. Electronically Signed   By: Suzen Dials M.D.   On: 03/19/2024 23:16   Data Reviewed for HPI: Relevant notes from primary care and specialist  visits, past discharge summaries as available in EHR, including Care Everywhere. Prior diagnostic testing as pertinent to current admission diagnoses Updated medications and problem lists for reconciliation ED course, including vitals, labs, imaging, treatment and response to treatment Triage notes, nursing and pharmacy notes and ED provider's notes Notable results as noted above in HPI      Assessment and Plan:   Acute on chronic respiratory failure with hypoxia and hypercapnia (HCC) Possible CAP Rocephin  and azithromycin  Scheduled and as needed nebulized bronchodilators Will hold off on steroids as no obvious wheezing and due to concern for pneumonia Flutter valve, antitussives Continue BiPAP and wean as tolerated  AKI Creatinine 1.09 up from baseline of 0.68 Will give a gentle bolus  Chronic diastolic CHF (congestive heart failure) (HCC) Clinically euvolemic Will continue home Lasix  and metoprolol  Daily weights with intake and output monitoring Last echo 01/2024 with EF 60-65% and G1 DD  Cancer of lower lobe of right lung (HCC) History of prior recurrent right pneumothorax No evidence of pneumothorax   Essential hypertension Continue metoprolol .   Diabetes mellitus without complication (HCC) Sliding scale insulin  coverage and continue home basal insulin    PAD (peripheral artery disease) (HCC) Does not appear to be on statins or antiplatelets.  Continue omega-3 fatty acid   Anxiety and depression Continue Lexapro  and Xanax        DVT prophylaxis: Lovenox   Consults: none  Advance Care Planning:   Code Status: Prior   Family Communication: none  Disposition Plan:  Back to previous home environment  Severity of Illness: The appropriate patient status for this patient is OBSERVATION. Observation status is judged to be reasonable and necessary in order to provide the required intensity of service to ensure the patient's safety. The patient's presenting  symptoms, physical exam findings, and initial radiographic and laboratory data in the context of their medical condition is felt to place them at decreased risk for further clinical deterioration. Furthermore, it is anticipated that the patient will be medically stable for discharge from the hospital within 2 midnights of admission.   Author: Delayne LULLA Solian, MD 03/19/2024 11:59 PM  For on call review [email or web address].

## 2024-03-20 ENCOUNTER — Telehealth: Payer: Self-pay

## 2024-03-20 ENCOUNTER — Encounter: Payer: Self-pay | Admitting: Internal Medicine

## 2024-03-20 ENCOUNTER — Other Ambulatory Visit (HOSPITAL_COMMUNITY): Payer: Self-pay

## 2024-03-20 ENCOUNTER — Telehealth (HOSPITAL_COMMUNITY): Payer: Self-pay | Admitting: Pharmacy Technician

## 2024-03-20 DIAGNOSIS — I495 Sick sinus syndrome: Secondary | ICD-10-CM | POA: Diagnosis present

## 2024-03-20 DIAGNOSIS — J432 Centrilobular emphysema: Secondary | ICD-10-CM | POA: Diagnosis present

## 2024-03-20 DIAGNOSIS — R042 Hemoptysis: Secondary | ICD-10-CM | POA: Diagnosis present

## 2024-03-20 DIAGNOSIS — Z7951 Long term (current) use of inhaled steroids: Secondary | ICD-10-CM | POA: Diagnosis not present

## 2024-03-20 DIAGNOSIS — F419 Anxiety disorder, unspecified: Secondary | ICD-10-CM | POA: Diagnosis present

## 2024-03-20 DIAGNOSIS — J441 Chronic obstructive pulmonary disease with (acute) exacerbation: Secondary | ICD-10-CM | POA: Diagnosis present

## 2024-03-20 DIAGNOSIS — J9621 Acute and chronic respiratory failure with hypoxia: Secondary | ICD-10-CM | POA: Diagnosis present

## 2024-03-20 DIAGNOSIS — Z7982 Long term (current) use of aspirin: Secondary | ICD-10-CM | POA: Diagnosis not present

## 2024-03-20 DIAGNOSIS — E1151 Type 2 diabetes mellitus with diabetic peripheral angiopathy without gangrene: Secondary | ICD-10-CM | POA: Diagnosis present

## 2024-03-20 DIAGNOSIS — F32A Depression, unspecified: Secondary | ICD-10-CM | POA: Diagnosis present

## 2024-03-20 DIAGNOSIS — E7800 Pure hypercholesterolemia, unspecified: Secondary | ICD-10-CM | POA: Diagnosis present

## 2024-03-20 DIAGNOSIS — J189 Pneumonia, unspecified organism: Secondary | ICD-10-CM | POA: Diagnosis present

## 2024-03-20 DIAGNOSIS — I110 Hypertensive heart disease with heart failure: Secondary | ICD-10-CM | POA: Diagnosis present

## 2024-03-20 DIAGNOSIS — Z9981 Dependence on supplemental oxygen: Secondary | ICD-10-CM | POA: Diagnosis not present

## 2024-03-20 DIAGNOSIS — Z794 Long term (current) use of insulin: Secondary | ICD-10-CM | POA: Diagnosis not present

## 2024-03-20 DIAGNOSIS — T380X5A Adverse effect of glucocorticoids and synthetic analogues, initial encounter: Secondary | ICD-10-CM | POA: Diagnosis present

## 2024-03-20 DIAGNOSIS — Z1152 Encounter for screening for COVID-19: Secondary | ICD-10-CM | POA: Diagnosis not present

## 2024-03-20 DIAGNOSIS — J440 Chronic obstructive pulmonary disease with acute lower respiratory infection: Secondary | ICD-10-CM | POA: Diagnosis present

## 2024-03-20 DIAGNOSIS — J9811 Atelectasis: Secondary | ICD-10-CM | POA: Diagnosis present

## 2024-03-20 DIAGNOSIS — R0602 Shortness of breath: Secondary | ICD-10-CM | POA: Diagnosis present

## 2024-03-20 DIAGNOSIS — Z7902 Long term (current) use of antithrombotics/antiplatelets: Secondary | ICD-10-CM | POA: Diagnosis not present

## 2024-03-20 DIAGNOSIS — J9622 Acute and chronic respiratory failure with hypercapnia: Secondary | ICD-10-CM | POA: Diagnosis present

## 2024-03-20 DIAGNOSIS — E1165 Type 2 diabetes mellitus with hyperglycemia: Secondary | ICD-10-CM | POA: Diagnosis present

## 2024-03-20 DIAGNOSIS — I5032 Chronic diastolic (congestive) heart failure: Secondary | ICD-10-CM | POA: Diagnosis present

## 2024-03-20 DIAGNOSIS — N179 Acute kidney failure, unspecified: Secondary | ICD-10-CM | POA: Diagnosis present

## 2024-03-20 LAB — BASIC METABOLIC PANEL WITH GFR
Anion gap: 11 (ref 5–15)
BUN: 16 mg/dL (ref 8–23)
CO2: 28 mmol/L (ref 22–32)
Calcium: 8.1 mg/dL — ABNORMAL LOW (ref 8.9–10.3)
Chloride: 104 mmol/L (ref 98–111)
Creatinine, Ser: 0.74 mg/dL (ref 0.44–1.00)
GFR, Estimated: >60 mL/min (ref >60)
Glucose, Bld: 227 mg/dL — ABNORMAL HIGH (ref 70–99)
Potassium: 4.6 mmol/L (ref 3.5–5.1)
Sodium: 143 mmol/L (ref 135–145)

## 2024-03-20 LAB — CBC
HCT: 34.2 % — ABNORMAL LOW (ref 36.0–46.0)
Hemoglobin: 10.3 g/dL — ABNORMAL LOW (ref 12.0–15.0)
MCH: 28.8 pg (ref 26.0–34.0)
MCHC: 30.1 g/dL (ref 30.0–36.0)
MCV: 95.5 fL (ref 80.0–100.0)
Platelets: 206 K/uL (ref 150–400)
RBC: 3.58 MIL/uL — ABNORMAL LOW (ref 3.87–5.11)
RDW: 14.2 % (ref 11.5–15.5)
WBC: 9 K/uL (ref 4.0–10.5)
nRBC: 0 % (ref 0.0–0.2)

## 2024-03-20 LAB — RESP PANEL BY RT-PCR (RSV, FLU A&B, COVID)  RVPGX2
Influenza A by PCR: NEGATIVE
Influenza B by PCR: NEGATIVE
Resp Syncytial Virus by PCR: NEGATIVE
SARS Coronavirus 2 by RT PCR: NEGATIVE

## 2024-03-20 LAB — GLUCOSE, CAPILLARY
Glucose-Capillary: 188 mg/dL — ABNORMAL HIGH (ref 70–99)
Glucose-Capillary: 197 mg/dL — ABNORMAL HIGH (ref 70–99)
Glucose-Capillary: 135 mg/dL — ABNORMAL HIGH (ref 70–99)

## 2024-03-20 LAB — CBG MONITORING, ED
Glucose-Capillary: 254 mg/dL — ABNORMAL HIGH (ref 70–99)
Glucose-Capillary: 232 mg/dL — ABNORMAL HIGH (ref 70–99)
Glucose-Capillary: 168 mg/dL — ABNORMAL HIGH (ref 70–99)

## 2024-03-20 LAB — PROCALCITONIN: Procalcitonin: <0.1 ng/mL

## 2024-03-20 LAB — TROPONIN I (HIGH SENSITIVITY): Troponin I (High Sensitivity): 20 ng/L — ABNORMAL HIGH (ref <18)

## 2024-03-20 MED ORDER — ONDANSETRON HCL 4 MG/2ML IJ SOLN: 4.0000 mg | Freq: Four times a day (QID) | INTRAMUSCULAR | Status: DC | PRN

## 2024-03-20 MED ORDER — ACETAMINOPHEN 650 MG RE SUPP: 650.0000 mg | Freq: Four times a day (QID) | RECTAL | Status: DC | PRN

## 2024-03-20 MED ORDER — INSULIN GLARGINE-YFGN 100 UNIT/ML ~~LOC~~ SOLN
10.0000 [IU] | Freq: Every day | SUBCUTANEOUS | Status: DC
  Administered 2024-03-20: 10 [IU] via SUBCUTANEOUS
  Filled 2024-03-20 (×2): qty 0.1

## 2024-03-20 MED ORDER — ENOXAPARIN SODIUM 40 MG/0.4ML IJ SOSY
40.0000 mg | PREFILLED_SYRINGE | INTRAMUSCULAR | Status: DC
  Administered 2024-03-20 – 2024-03-24 (×5): 40 mg via SUBCUTANEOUS
  Filled 2024-03-20 (×5): qty 0.4

## 2024-03-20 MED ORDER — METHYLPREDNISOLONE SODIUM SUCC 40 MG IJ SOLR
40.0000 mg | Freq: Two times a day (BID) | INTRAMUSCULAR | Status: DC
  Administered 2024-03-20: 40 mg via INTRAVENOUS
  Filled 2024-03-20: qty 1

## 2024-03-20 MED ORDER — METOPROLOL SUCCINATE ER 50 MG PO TB24
50.0000 mg | ORAL_TABLET | Freq: Every day | ORAL | Status: DC
  Administered 2024-03-20 – 2024-03-24 (×5): 50 mg via ORAL
  Filled 2024-03-20 (×5): qty 1

## 2024-03-20 MED ORDER — ACETAMINOPHEN 325 MG RE SUPP
650.0000 mg | Freq: Four times a day (QID) | RECTAL | Status: DC | PRN
Start: 2024-03-20 — End: 2024-03-20

## 2024-03-20 MED ORDER — GUAIFENESIN-DM 100-10 MG/5ML PO SYRP
30.0000 mL | ORAL_SOLUTION | ORAL | Status: DC | PRN
  Administered 2024-03-24: 30 mL via ORAL
  Filled 2024-03-20: qty 30

## 2024-03-20 MED ORDER — SODIUM CHLORIDE 0.9 % IV BOLUS
500.0000 mL | Freq: Once | INTRAVENOUS | Status: AC
  Administered 2024-03-20: 500 mL via INTRAVENOUS

## 2024-03-20 MED ORDER — ALPRAZOLAM 0.5 MG PO TABS
0.5000 mg | ORAL_TABLET | Freq: Two times a day (BID) | ORAL | Status: DC | PRN
  Administered 2024-03-20 – 2024-03-23 (×4): 0.5 mg via ORAL
  Filled 2024-03-20 (×4): qty 1

## 2024-03-20 MED ORDER — ACETAMINOPHEN 325 MG PO TABS
650.0000 mg | ORAL_TABLET | Freq: Four times a day (QID) | ORAL | Status: DC | PRN
Start: 2024-03-20 — End: 2024-03-20

## 2024-03-20 MED ORDER — SODIUM CHLORIDE 0.9 % IV SOLN
1.0000 g | Freq: Once | INTRAVENOUS | Status: AC
  Administered 2024-03-20: 1 g via INTRAVENOUS
  Filled 2024-03-20: qty 10

## 2024-03-20 MED ORDER — CYCLOBENZAPRINE HCL 10 MG PO TABS
10.0000 mg | ORAL_TABLET | Freq: Every evening | ORAL | Status: DC | PRN
  Administered 2024-03-23: 10 mg via ORAL
  Filled 2024-03-20: qty 1

## 2024-03-20 MED ORDER — INSULIN ASPART 100 UNIT/ML IJ SOLN
0.0000 [IU] | Freq: Three times a day (TID) | INTRAMUSCULAR | Status: DC
  Administered 2024-03-20: 4 [IU] via SUBCUTANEOUS
  Administered 2024-03-20: 7 [IU] via SUBCUTANEOUS
  Administered 2024-03-20 – 2024-03-21 (×2): 4 [IU] via SUBCUTANEOUS
  Administered 2024-03-22: 15 [IU] via SUBCUTANEOUS
  Administered 2024-03-22: 3 [IU] via SUBCUTANEOUS
  Administered 2024-03-23 (×2): 11 [IU] via SUBCUTANEOUS
  Administered 2024-03-23: 15 [IU] via SUBCUTANEOUS
  Administered 2024-03-24: 7 [IU] via SUBCUTANEOUS
  Administered 2024-03-24: 3 [IU] via SUBCUTANEOUS
  Filled 2024-03-20: qty 7
  Filled 2024-03-20 (×4): qty 1
  Filled 2024-03-20: qty 3
  Filled 2024-03-20 (×5): qty 1

## 2024-03-20 MED ORDER — IPRATROPIUM-ALBUTEROL 0.5-2.5 (3) MG/3ML IN SOLN
3.0000 mL | Freq: Four times a day (QID) | RESPIRATORY_TRACT | Status: DC
  Administered 2024-03-20 (×3): 3 mL via RESPIRATORY_TRACT
  Filled 2024-03-20: qty 39
  Filled 2024-03-20 (×2): qty 3

## 2024-03-20 MED ORDER — FERROUS SULFATE 325 (65 FE) MG PO TABS
325.0000 mg | ORAL_TABLET | Freq: Two times a day (BID) | ORAL | Status: DC
  Administered 2024-03-20 – 2024-03-24 (×9): 325 mg via ORAL
  Filled 2024-03-20 (×9): qty 1

## 2024-03-20 MED ORDER — ACETAMINOPHEN 325 MG PO TABS: 650.0000 mg | ORAL_TABLET | Freq: Four times a day (QID) | ORAL | Status: DC | PRN

## 2024-03-20 MED ORDER — FUROSEMIDE 20 MG PO TABS: 20.0000 mg | ORAL_TABLET | Freq: Every day | ORAL | Status: DC | PRN

## 2024-03-20 MED ORDER — ONDANSETRON HCL 4 MG PO TABS: 4.0000 mg | ORAL_TABLET | Freq: Four times a day (QID) | ORAL | Status: DC | PRN

## 2024-03-20 MED ORDER — CLOPIDOGREL BISULFATE 75 MG PO TABS
75.0000 mg | ORAL_TABLET | Freq: Every day | ORAL | Status: DC
  Administered 2024-03-20 – 2024-03-24 (×5): 75 mg via ORAL
  Filled 2024-03-20 (×5): qty 1

## 2024-03-20 MED ORDER — ALBUTEROL SULFATE (2.5 MG/3ML) 0.083% IN NEBU
2.5000 mg | INHALATION_SOLUTION | RESPIRATORY_TRACT | Status: DC | PRN
  Administered 2024-03-20 – 2024-03-21 (×2): 2.5 mg via RESPIRATORY_TRACT
  Filled 2024-03-20 (×2): qty 3

## 2024-03-20 MED ORDER — INSULIN ASPART 100 UNIT/ML IJ SOLN
0.0000 [IU] | Freq: Every day | INTRAMUSCULAR | Status: DC
  Administered 2024-03-20: 3 [IU] via SUBCUTANEOUS
  Administered 2024-03-22: 2 [IU] via SUBCUTANEOUS
  Filled 2024-03-20: qty 1
  Filled 2024-03-20: qty 3

## 2024-03-20 MED ORDER — ATORVASTATIN CALCIUM 10 MG PO TABS
10.0000 mg | ORAL_TABLET | Freq: Every day | ORAL | Status: DC
  Administered 2024-03-20 – 2024-03-24 (×5): 10 mg via ORAL
  Filled 2024-03-20 (×5): qty 1

## 2024-03-20 MED ORDER — OXYCODONE-ACETAMINOPHEN 5-325 MG PO TABS: 1.0000 | ORAL_TABLET | Freq: Four times a day (QID) | ORAL | 0 refills | Status: DC | PRN

## 2024-03-20 MED ORDER — PREDNISONE 20 MG PO TABS: 40.0000 mg | ORAL_TABLET | Freq: Every day | ORAL | Status: DC

## 2024-03-20 MED ORDER — SODIUM CHLORIDE 0.9 % IV SOLN
500.0000 mg | INTRAVENOUS | Status: AC
  Administered 2024-03-21 – 2024-03-24 (×4): 500 mg via INTRAVENOUS
  Filled 2024-03-20 (×5): qty 5

## 2024-03-20 MED ORDER — ESCITALOPRAM OXALATE 10 MG PO TABS
10.0000 mg | ORAL_TABLET | Freq: Every day | ORAL | Status: DC
  Administered 2024-03-20 – 2024-03-24 (×5): 10 mg via ORAL
  Filled 2024-03-20 (×5): qty 1

## 2024-03-20 MED ORDER — LOSARTAN POTASSIUM 25 MG PO TABS
25.0000 mg | ORAL_TABLET | Freq: Every day | ORAL | Status: DC
  Administered 2024-03-20 – 2024-03-23 (×4): 25 mg via ORAL
  Filled 2024-03-20 (×4): qty 1

## 2024-03-20 MED ORDER — SODIUM CHLORIDE 0.9 % IV SOLN
2.0000 g | INTRAVENOUS | Status: AC
  Administered 2024-03-21 – 2024-03-24 (×4): 2 g via INTRAVENOUS
  Filled 2024-03-20 (×5): qty 20

## 2024-03-20 MED ORDER — POTASSIUM CHLORIDE CRYS ER 10 MEQ PO TBCR
10.0000 meq | EXTENDED_RELEASE_TABLET | ORAL | Status: DC
  Administered 2024-03-20 – 2024-03-24 (×3): 10 meq via ORAL
  Filled 2024-03-20 (×3): qty 1

## 2024-03-20 MED ORDER — ASPIRIN 81 MG PO TBEC
81.0000 mg | DELAYED_RELEASE_TABLET | Freq: Every day | ORAL | Status: DC
  Administered 2024-03-20 – 2024-03-24 (×5): 81 mg via ORAL
  Filled 2024-03-20 (×5): qty 1

## 2024-03-20 MED ORDER — OXYCODONE-ACETAMINOPHEN 5-325 MG PO TABS
1.0000 | ORAL_TABLET | Freq: Four times a day (QID) | ORAL | Status: DC | PRN
  Administered 2024-03-20 – 2024-03-24 (×8): 1 via ORAL
  Filled 2024-03-20 (×8): qty 1

## 2024-03-20 NOTE — Progress Notes (Signed)
   03/20/2024  Patient ID: [name] Brain, female   DOB: 1956/07/07, 68 y.o.   MRN: [id number]  This patient is appearing on a report for being at risk of failing the adherence measure for identified medications this calendar year.   Medication Adherence Summary (STAR/HEDIS Monitoring): Adherence Category: cholesterol (statin)    Drug Name: Atorvastatin  10 mg daily Sold Date:02/17/2024 Days' Supply: 90     Notes: ? Adherence data pulled from pharmacy claims portal Dr. Annemarie. Per chart review, patient recently admitted in the hospital for COPD exacerbation.  ? Patient education provided on importance of adherence for chronic condition management and STAR quality performance. ? Reviewed barriers to adherence: N/A. ? Plan: Follow for next fill in October  Dorcas Solian, PharmD Clinical Pharmacist Cell: [phone]

## 2024-03-20 NOTE — Progress Notes (Signed)
  PROGRESS NOTE    Angela Jensen  FMW:[id number] DOB: 1956-06-18 DOA: 03/19/2024 PCP: Liana Fish, NP  [id number]/[id number]-AA  LOS: 0 days   Brief hospital course:   Assessment & Plan: Angela Jensen is a 68 y.o. female with medical history significant for COPD on 4 L nasal cannula at baseline, right lung cancer, s/p radiation therapy, recurrent right pneumothorax x 2 most recently in August 2024  as well as history of CHF (EF 50% 04/2022), HTN, DM and PAD, being admitted with a COPD exacerbation requiring BiPAP.  On arrival of EMS to the home she was apparently on room air and saturating in the 50s.  She reports recent fevers and cough.    Acute on chronic respiratory failure with hypoxia 2/2 CAP On 4L O2 at baseline --CXR showed right basilar airspace disease. --cont ceftriaxone  and azithro --Continue supplemental O2 to keep sats >=90%, wean as tolerated  COPD, ruled out exacerbation --pt improved without having received steroid, and no wheezing. --will not start steroid --cont DuoNeb scheduled   AKI Creatinine 1.09 up from baseline of 0.68 --improved with IVF. --hold further IVF   Chronic diastolic CHF (congestive heart failure) (HCC) Clinically euvolemic Last echo 01/2024 with EF 60-65% and G1 DD --cont Toprol  and losartan    Cancer of lower lobe of right lung (HCC) History of prior recurrent right pneumothorax No evidence of pneumothorax   Essential hypertension --cont Toprol  and losartan    Diabetes mellitus without complication (HCC) --cont glargine 10u daily --ACHS and SSI   PAD (peripheral artery disease) (HCC) --cont ASA and plavix    Anxiety and depression Continue Lexapro  and Xanax    DVT prophylaxis: Lovenox  SQ Code Status: Full code  Family Communication:  Level of care: Med-Surg Dispo:   The patient is from: home Anticipated d/c is to: home Anticipated d/c date is: 2-3 days   Subjective and Interval History:  Pt reported dyspnea improved.  Reported  cough with blood-tinged sputum.   Objective: Vitals:   03/20/24 1200 03/20/24 1245 03/20/24 1337 03/20/24 1634  BP: (!) 140/90  (!) 158/80 (!) 169/85  Pulse: 82 80 82 79  Resp: 19 16 18 18   Temp:   98.9 F (37.2 C) 98.7 F (37.1 C)  TempSrc:   Oral Oral  SpO2: 98% 98% 95% 97%  Weight:      Height:       No intake or output data in the 24 hours ending 03/20/24 1812 Filed Weights   03/19/24 2239  Weight: 55 kg    Examination:   Constitutional: NAD, AAOx3 HEENT: conjunctivae and lids normal, EOMI CV: No cyanosis.   RESP: normal respiratory effort, no wheezing, on 6L sating 100% Neuro: II - XII grossly intact.   Psych: Normal mood and affect.  Appropriate judgement and reason   Data Reviewed: I have personally reviewed labs and imaging studies  Time spent: 50 minutes  Ellouise Haber, MD [facility] If 7PM-7AM, please contact night-coverage 03/20/2024, 6:12 PM

## 2024-03-20 NOTE — Telephone Encounter (Signed)
 Try to call pt unable to leave message

## 2024-03-20 NOTE — ED Notes (Signed)
 Cleatus, MD to bedside to evaluate patient. Patient currently up and using the restroom. Cleatus, MD stated to trial patient off of BIPAP on her normal 3L. RT notified.

## 2024-03-20 NOTE — ED Notes (Signed)
 Patient moved from BIPAP to 3L [place], work of breathing still significantly increased with O2 sats around 90%, O2 increased to 6L with improvement in work of breathing and sats.

## 2024-03-20 NOTE — ED Notes (Signed)
Assisted patient to the restroom at this time.

## 2024-03-20 NOTE — Telephone Encounter (Signed)
 Patient Product/process development scientist completed.    The patient is insured through [employer] Patient has Medicare and is not eligible for a copay card, but may be able to apply for patient assistance or Medicare RX Payment Plan (Patient Must reach out to their plan, if eligible for payment plan), if available.    Ran test claim for Farxiga 10 mg and the current 30 day co-pay is $0.00.  Ran test claim for Jardiance 10 mg and the current 30 day co-pay is $0.00.  This test claim was processed through [facility]- copay amounts may vary at other pharmacies due to pharmacy/plan contracts, or as the patient moves through the different stages of their insurance plan.     Roland Earl, CPHT Pharmacy Technician III Certified Patient Advocate [facility] Patient Advocate Team Direct Number: [phone]  Fax: [phone]

## 2024-03-21 DIAGNOSIS — J441 Chronic obstructive pulmonary disease with (acute) exacerbation: Secondary | ICD-10-CM | POA: Diagnosis not present

## 2024-03-21 LAB — GLUCOSE, CAPILLARY
Glucose-Capillary: 81 mg/dL (ref 70–99)
Glucose-Capillary: 119 mg/dL — ABNORMAL HIGH (ref 70–99)
Glucose-Capillary: 131 mg/dL — ABNORMAL HIGH (ref 70–99)
Glucose-Capillary: 185 mg/dL — ABNORMAL HIGH (ref 70–99)
Glucose-Capillary: 109 mg/dL — ABNORMAL HIGH (ref 70–99)
Glucose-Capillary: 93 mg/dL (ref 70–99)

## 2024-03-21 MED ORDER — PREGABALIN 75 MG PO CAPS
75.0000 mg | ORAL_CAPSULE | Freq: Two times a day (BID) | ORAL | Status: DC
  Administered 2024-03-21 – 2024-03-24 (×6): 75 mg via ORAL
  Filled 2024-03-21 (×6): qty 1

## 2024-03-21 MED ORDER — FLUTICASONE FUROATE-VILANTEROL 100-25 MCG/ACT IN AEPB
1.0000 | INHALATION_SPRAY | Freq: Every day | RESPIRATORY_TRACT | Status: DC
  Administered 2024-03-21 – 2024-03-24 (×4): 1 via RESPIRATORY_TRACT
  Filled 2024-03-21: qty 28

## 2024-03-21 MED ORDER — IPRATROPIUM-ALBUTEROL 0.5-2.5 (3) MG/3ML IN SOLN
3.0000 mL | Freq: Three times a day (TID) | RESPIRATORY_TRACT | Status: DC
  Administered 2024-03-21 – 2024-03-24 (×8): 3 mL via RESPIRATORY_TRACT
  Filled 2024-03-21 (×7): qty 3

## 2024-03-21 MED ORDER — INSULIN GLARGINE-YFGN 100 UNIT/ML ~~LOC~~ SOLN
5.0000 [IU] | Freq: Every day | SUBCUTANEOUS | Status: DC
  Administered 2024-03-21 – 2024-03-24 (×4): 5 [IU] via SUBCUTANEOUS
  Filled 2024-03-21 (×4): qty 0.05

## 2024-03-21 MED ORDER — HYDRALAZINE HCL 20 MG/ML IJ SOLN
10.0000 mg | Freq: Four times a day (QID) | INTRAMUSCULAR | Status: DC | PRN
  Filled 2024-03-21: qty 1

## 2024-03-21 MED ORDER — SODIUM CHLORIDE 3 % IN NEBU
4.0000 mL | INHALATION_SOLUTION | Freq: Two times a day (BID) | RESPIRATORY_TRACT | Status: DC
  Administered 2024-03-22: 4 mL via RESPIRATORY_TRACT
  Filled 2024-03-21 (×3): qty 4

## 2024-03-21 MED ORDER — IPRATROPIUM-ALBUTEROL 0.5-2.5 (3) MG/3ML IN SOLN
3.0000 mL | Freq: Three times a day (TID) | RESPIRATORY_TRACT | Status: DC
  Administered 2024-03-21 (×2): 3 mL via RESPIRATORY_TRACT
  Filled 2024-03-21 (×2): qty 3

## 2024-03-21 NOTE — Plan of Care (Signed)

## 2024-03-21 NOTE — Care Management Important Message (Signed)
 Important Message  Patient Details  Name: Angela Jensen MRN: [id number] Date of Birth: 28-Jan-1956   Important Message Given:  Yes - Medicare IM     [name] 03/21/2024, 12:04 PM

## 2024-03-21 NOTE — Progress Notes (Signed)
  PROGRESS NOTE    Angela Jensen  FMW:[id number] DOB: 1955/09/24 DOA: 03/19/2024 PCP: Liana Fish, NP  [id number]/[id number]-AA  LOS: 1 day   Brief hospital course:   Assessment & Plan: Angela Jensen is a 68 y.o. female with medical history significant for COPD on 4 L nasal cannula at baseline, right lung cancer, s/p radiation therapy, recurrent right pneumothorax x 2 most recently in August 2024  as well as history of CHF (EF 50% 04/2022), HTN, DM and PAD, being admitted with a COPD exacerbation requiring BiPAP.  On arrival of EMS to the home she was apparently on room air and saturating in the 50s.  She reports recent fevers and cough.    Acute on chronic respiratory failure with hypoxia 2/2 CAP On 4L O2 at baseline --CXR showed right basilar airspace disease. --cont ceftriaxone  and azithro --cont home 4L O2 --start hypertonic saline neb BID with DuoNeb --daughter requested pulm consult tomorrow with Dr. Aleskerov if respiratory status worsens.  COPD, ruled out exacerbation --pt improved without having received steroid, and no wheezing. --will not start steroid --cont DuoNeb scheduled --cont home bronchodilators   AKI Creatinine 1.09 up from baseline of 0.68 --improved with IVF. --oral hydration now   Chronic diastolic CHF (congestive heart failure) (HCC) Clinically euvolemic Last echo 01/2024 with EF 60-65% and G1 DD --cont Toprol  and losartan    Cancer of lower lobe of right lung (HCC) History of prior recurrent right pneumothorax No evidence of pneumothorax   Essential hypertension --cont Toprol  and losartan    Diabetes mellitus without complication (HCC) Hyperglycemia --reduce glargine from 10 to 5 u daily --ACHS and SSI   PAD (peripheral artery disease) (HCC) --cont ASA and plavix    Anxiety and depression --cont Lexapro  and Xanax    DVT prophylaxis: Lovenox  SQ Code Status: Full code  Family Communication: daughter updated at bedside today Level of care:  Med-Surg Dispo:   The patient is from: home Anticipated d/c is to: home Anticipated d/c date is: 2 days   Subjective and Interval History:  Pt's dyspnea worsened today.  Per daughter, it's because pt's O2 tubing was too short and fell off, so pt was without O2 for 15 min.  Continued to report blood-tinged sputum.   Objective: Vitals:   03/21/24 1300 03/21/24 1513 03/21/24 1736 03/21/24 1747  BP:  (!) 152/99  (!) 171/94  Pulse:  89 86 85  Resp:  (!) 22 20 20   Temp:  98.5 F (36.9 C) 97.8 F (36.6 C)   TempSrc:  Oral    SpO2: 92% 94% 95% 92%  Weight:      Height:        Intake/Output Summary (Last 24 hours) at 03/21/2024 1752 Last data filed at 03/21/2024 1100 Gross per 24 hour  Intake 694.08 ml  Output --  Net 694.08 ml   Filed Weights   03/19/24 2239  Weight: 55 kg    Examination:   Constitutional: NAD, AAOx3 HEENT: conjunctivae and lids normal, EOMI CV: No cyanosis.   RESP: normal respiratory effort, on [place] [facility]: II - XII grossly intact.     Data Reviewed: I have personally reviewed labs and imaging studies  Time spent: 50 minutes  Ellouise Haber, MD [facility] If 7PM-7AM, please contact night-coverage 03/21/2024, 5:52 PM

## 2024-03-22 DIAGNOSIS — J441 Chronic obstructive pulmonary disease with (acute) exacerbation: Secondary | ICD-10-CM | POA: Diagnosis not present

## 2024-03-22 LAB — CBC
HCT: 32 % — ABNORMAL LOW (ref 36.0–46.0)
Hemoglobin: 9.6 g/dL — ABNORMAL LOW (ref 12.0–15.0)
MCH: 28.4 pg (ref 26.0–34.0)
MCHC: 30 g/dL (ref 30.0–36.0)
MCV: 94.7 fL (ref 80.0–100.0)
Platelets: 203 K/uL (ref 150–400)
RBC: 3.38 MIL/uL — ABNORMAL LOW (ref 3.87–5.11)
RDW: 14.3 % (ref 11.5–15.5)
WBC: 7 K/uL (ref 4.0–10.5)
nRBC: 0 % (ref 0.0–0.2)

## 2024-03-22 LAB — RESPIRATORY PANEL BY PCR

## 2024-03-22 LAB — GLUCOSE, CAPILLARY
Glucose-Capillary: 84 mg/dL (ref 70–99)
Glucose-Capillary: 124 mg/dL — ABNORMAL HIGH (ref 70–99)
Glucose-Capillary: 335 mg/dL — ABNORMAL HIGH (ref 70–99)
Glucose-Capillary: 215 mg/dL — ABNORMAL HIGH (ref 70–99)

## 2024-03-22 LAB — MRSA NEXT GEN BY PCR, NASAL: MRSA by PCR Next Gen: NOT DETECTED

## 2024-03-22 LAB — BASIC METABOLIC PANEL WITH GFR
Anion gap: 5 (ref 5–15)
BUN: 16 mg/dL (ref 8–23)
CO2: 32 mmol/L (ref 22–32)
Calcium: 8.4 mg/dL — ABNORMAL LOW (ref 8.9–10.3)
Chloride: 105 mmol/L (ref 98–111)
Creatinine, Ser: 0.57 mg/dL (ref 0.44–1.00)
GFR, Estimated: >60 mL/min (ref >60)
Glucose, Bld: 83 mg/dL (ref 70–99)
Potassium: 3.8 mmol/L (ref 3.5–5.1)
Sodium: 142 mmol/L (ref 135–145)

## 2024-03-22 LAB — C-REACTIVE PROTEIN: CRP: 2.1 mg/dL — ABNORMAL HIGH (ref <1.0)

## 2024-03-22 LAB — MAGNESIUM: Magnesium: 2.1 mg/dL (ref 1.7–2.4)

## 2024-03-22 LAB — SARS CORONAVIRUS 2 BY RT PCR: SARS Coronavirus 2 by RT PCR: NEGATIVE

## 2024-03-22 MED ORDER — PREDNISONE 20 MG PO TABS
50.0000 mg | ORAL_TABLET | Freq: Three times a day (TID) | ORAL | Status: AC
  Administered 2024-03-22 – 2024-03-23 (×3): 50 mg via ORAL
  Filled 2024-03-22 (×3): qty 1

## 2024-03-22 MED ORDER — TRANEXAMIC ACID FOR INHALATION
500.0000 mg | Freq: Three times a day (TID) | RESPIRATORY_TRACT | Status: AC
  Administered 2024-03-22 – 2024-03-23 (×2): 500 mg via RESPIRATORY_TRACT
  Filled 2024-03-22 (×2): qty 5

## 2024-03-22 MED ORDER — DIPHENHYDRAMINE HCL 25 MG PO CAPS
50.0000 mg | ORAL_CAPSULE | Freq: Once | ORAL | Status: AC
  Administered 2024-03-23: 50 mg via ORAL
  Filled 2024-03-22: qty 2

## 2024-03-22 MED ORDER — VITAMIN B-12 100 MCG PO TABS
100.0000 ug | ORAL_TABLET | Freq: Every day | ORAL | Status: DC
  Administered 2024-03-22 – 2024-03-24 (×3): 100 ug via ORAL
  Filled 2024-03-22 (×3): qty 1

## 2024-03-22 MED ORDER — DIPHENHYDRAMINE HCL 50 MG/ML IJ SOLN: 50.0000 mg | Freq: Once | INTRAMUSCULAR | Status: AC

## 2024-03-22 MED ORDER — METHYLPREDNISOLONE SODIUM SUCC 40 MG IJ SOLR
40.0000 mg | Freq: Two times a day (BID) | INTRAMUSCULAR | Status: DC
  Administered 2024-03-22 – 2024-03-23 (×3): 40 mg via INTRAVENOUS
  Filled 2024-03-22 (×3): qty 1

## 2024-03-22 MED ORDER — CALCIUM CARBONATE 1250 (500 CA) MG PO TABS
500.0000 mg | ORAL_TABLET | Freq: Two times a day (BID) | ORAL | Status: DC
  Administered 2024-03-22 – 2024-03-24 (×4): 1250 mg via ORAL
  Filled 2024-03-22 (×4): qty 1

## 2024-03-22 NOTE — Consult Note (Signed)
 PULMONOLOGY         Date: 03/22/2024,   MRN# [id number] Angela Jensen 02/07/1956     AdmissionWeight: 55 kg                 CurrentWeight: 55 kg  Referring provider: Dr Leesa    CHIEF COMPLAINT:   Non massive hemoptysis   HISTORY OF PRESENT ILLNESS   68 yo F with hx of chronic anemia, Asthma and COPD overlap with right lung cancer and recent onset of hemoptysis.  She also has background of DM, PAD, HTN, Systolic CHF, recurrent right pneumothorax.  She shares that she was in her usual state of health when all of the sudden she noted cough and non massive hemoptysis which started appx 5 days prior to admission.  She attempted to wait for few days to see if bleeding will subside but it has not and she seeks ER Evaluation for this reason. She is on O2 during my evaluation and continues to cough up blood. She denies having chest pains and denies blood clots. Denies medication noncompliance, she does take asa and plavix . PCCM consultation for acute on chronic hypoxemia with non massive hemoptysis.    PAST MEDICAL HISTORY   Past Medical History:  Diagnosis Date   Anemia    Arthritis    Asthma    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/2016   Cancer (HCC) 2012   Right Lung CA   COPD (chronic obstructive pulmonary disease) (HCC)    Depression    Diabetes mellitus without complication (HCC)    Patient takes Janumet   Essential hypertension 05/26/2016   Heart failure (HCC) 2022   Hydropneumothorax 05/03/2020   Hypercholesteremia    Oxygen  dependent    2L at nite    PAD (peripheral artery disease) (HCC) 06/22/2016   Peripheral vascular disease (HCC)    Personal history of radiation therapy    Shortness of breath dyspnea    with exertion    Sialolithiasis    Sleep apnea    Wears dentures    full upper and lower     SURGICAL HISTORY   Past Surgical History:  Procedure Laterality Date   APPLICATION OF CELL SAVER  Left 01/21/2024   Procedure: APPLICATION OF CELL SAVER;  Surgeon: Marea Selinda RAMAN, MD;  Location: [facility];  Service: Vascular;  Laterality: Left;   CESAREAN SECTION     x3   COLONOSCOPY WITH PROPOFOL  N/A 06/25/2015   Procedure: COLONOSCOPY WITH PROPOFOL ;  Surgeon: Rogelia Copping, MD;  Location: [facility];  Service: Endoscopy;  Laterality: N/A;   COLONOSCOPY WITH PROPOFOL  N/A 07/26/2020   Procedure: COLONOSCOPY WITH PROPOFOL ;  Surgeon: Copping Rogelia, MD;  Location[facility];  Service: Endoscopy;  Laterality: N/A;   CYST REMOVAL LEG     and on shoulder    ENDARTERECTOMY FEMORAL Left 01/21/2024   Procedure: LEFT COMMON, SUPERFICIAL FEMORAL AND PROFUNDA FEMORIS ENDARTERECTOMY WITH BOVINE PERICARDIAL PATCH ANGIOPLASTY;  Surgeon: Marea Selinda RAMAN, MD;  Location: [facility];  Service: Vascular;  Laterality: Left;   ESOPHAGOGASTRODUODENOSCOPY (EGD) WITH PROPOFOL  N/A 07/26/2020   Procedure: ESOPHAGOGASTRODUODENOSCOPY (EGD) WITH PROPOFOL ;  Surgeon: Copping Rogelia, MD;  Location: [facility];  Service: Endoscopy;  Laterality: N/A;  Diabetic - oral meds   INSERTION OF ILIAC STENT Left 01/21/2024   Procedure: INSERTION, STENT, ARTERY, ILIAC;  Surgeon: Marea Selinda RAMAN, MD;  Location: [facility];  Service: Vascular;  Laterality: Left;  AND SFA  STENTS   LOWER EXTREMITY ANGIOGRAPHY Left 09/29/2018   Procedure: LOWER EXTREMITY ANGIOGRAPHY;  Surgeon: Marea Selinda RAMAN, MD;  Location: [facility];  Service: Cardiovascular;  Laterality: Left;   LOWER EXTREMITY ANGIOGRAPHY Left 07/29/2023   Procedure: Lower Extremity Angiography;  Surgeon: Marea Selinda RAMAN, MD;  Location: [facility];  Service: Cardiovascular;  Laterality: Left;   LOWER EXTREMITY ANGIOGRAPHY Right 12/20/2023   Procedure: Lower Extremity Angiography;  Surgeon: Marea Selinda RAMAN, MD;  Location: [facility];  Service: Cardiovascular;  Laterality: Right;   LOWER EXTREMITY ANGIOGRAPHY Left 01/17/2024   Procedure: Lower Extremity Angiography;   Surgeon: Marea Selinda RAMAN, MD;  Location: [facility];  Service: Cardiovascular;  Laterality: Left;   LUNG BIOPSY  12/30/2011   has lung spots   PACEMAKER IMPLANT  07/14/2021   PACEMAKER LEADLESS INSERTION N/A 07/14/2021   Procedure: PACEMAKER LEADLESS INSERTION;  Surgeon: Ammon Blunt, MD;  Location: [facility];  Service: Cardiovascular;  Laterality: N/A;   PERIPHERAL VASCULAR CATHETERIZATION Left 06/01/2016   Procedure: Lower Extremity Angiography;  Surgeon: Selinda RAMAN Marea, MD;  Location: [facility];  Service: Cardiovascular;  Laterality: Left;   PERIPHERAL VASCULAR CATHETERIZATION N/A 06/01/2016   Procedure: Abdominal Aortogram w/Lower Extremity;  Surgeon: Selinda RAMAN Marea, MD;  Location: [facility];  Service: Cardiovascular;  Laterality: N/A;   PERIPHERAL VASCULAR CATHETERIZATION  06/01/2016   Procedure: Lower Extremity Intervention;  Surgeon: Selinda RAMAN Marea, MD;  Location: [facility];  Service: Cardiovascular;;   PERIPHERAL VASCULAR CATHETERIZATION Right 06/08/2016   Procedure: Lower Extremity Angiography;  Surgeon: Selinda RAMAN Marea, MD;  Location: [facility];  Service: Cardiovascular;  Laterality: Right;   PERIPHERAL VASCULAR CATHETERIZATION  06/08/2016   Procedure: Lower Extremity Intervention;  Surgeon: Selinda RAMAN Marea, MD;  Location: [facility];  Service: Cardiovascular;;   SUBMANDIBULAR GLAND EXCISION Left 12/06/2020   Procedure: EXCISION SUBMANDIBULAR GLAND;  Surgeon: Herminio Miu, MD;  Location: [facility];  Service: ENT;  Laterality: Left;  needs to be first case Diabetic - diet controlled   TEMPORARY PACEMAKER N/A 07/11/2021   Procedure: TEMPORARY PACEMAKER;  Surgeon: Ammon Blunt, MD;  Location: [facility];  Service: Cardiovascular;  Laterality: N/A;     FAMILY HISTORY   Family History  Problem Relation Age of Onset   Diabetes Mother    Hypercholesterolemia Mother    Lung cancer Father     Diabetes Sister    Diabetes Sister    Hypertension Sister    Diabetes Maternal Grandmother    Diabetes Paternal Grandmother    Heart attack Brother    Coronary artery disease Brother    Vascular Disease Brother    Heart attack Brother    Breast cancer Neg Hx      SOCIAL HISTORY   Social History   Tobacco Use   Smoking status: Former    Current packs/day: 0.00    Average packs/day: 1 pack/day for 37.0 years (37.0 ttl pk-yrs)    Types: Cigarettes    Start date: 02/06/1973    Quit date: 02/06/2010    Years since quitting: 14.1   Smokeless tobacco: Former    Types: Snuff  Vaping Use   Vaping status: Never Used  Substance Use Topics   Alcohol use: Not Currently    Alcohol/week: 5.0 standard drinks of alcohol    Types: 5 Cans of beer per week    Comment: /h x of alcohol  abuse -stopped 2012- now drinks 5 beer per week   Drug use: Not Currently    Types: Marijuana, Crack cocaine, Cocaine    Comment: hx of cocaine use- last use 2015; last use marijuana6/22/19,     MEDICATIONS    Home Medication:    Current Medication:  Current Facility-Administered Medications:    acetaminophen  (TYLENOL ) tablet 650 mg, 650 mg, Oral, Q6H PRN **OR** acetaminophen  (TYLENOL ) suppository 650 mg, 650 mg, Rectal, Q6H PRN, Tobie Cathaleen RAMAN, RPH   albuterol  (PROVENTIL ) (2.5 MG/3ML) 0.083% nebulizer solution 2.5 mg, 2.5 mg, Nebulization, Q2H PRN, Cleatus Delayne GAILS, MD, 2.5 mg at 03/21/24 1202   ALPRAZolam  (XANAX ) tablet 0.5 mg, 0.5 mg, Oral, BID PRN, Cleatus Delayne GAILS, MD, 0.5 mg at 03/21/24 1156   aspirin  EC tablet 81 mg, 81 mg, Oral, Daily, Cleatus Delayne GAILS, MD, 81 mg at 03/22/24 9146   atorvastatin  (LIPITOR) tablet 10 mg, 10 mg, Oral, Daily, Cleatus Delayne GAILS, MD, 10 mg at 03/22/24 9146   azithromycin  (ZITHROMAX ) 500 mg in sodium chloride  0.9 % 250 mL IVPB, 500 mg, Intravenous, Q24H, Cleatus Delayne GAILS, MD, Last Rate: 250 mL/hr at 03/21/24 2350, 500 mg at 03/21/24 2350   calcium  carbonate (OS-CAL -  dosed in mg of elemental calcium ) tablet 1,250 mg, 500 mg of elemental calcium , Oral, BID WC, Dezii, Alexandra, DO   cefTRIAXone  (ROCEPHIN ) 2 g in sodium chloride  0.9 % 100 mL IVPB, 2 g, Intravenous, Q24H, Cleatus Delayne GAILS, MD, Last Rate: 200 mL/hr at 03/21/24 2345, 2 g at 03/21/24 2345   clopidogrel  (PLAVIX ) tablet 75 mg, 75 mg, Oral, Daily, Cleatus Delayne GAILS, MD, 75 mg at 03/22/24 9146   cyclobenzaprine  (FLEXERIL ) tablet 10 mg, 10 mg, Oral, QHS PRN, Cleatus Delayne GAILS, MD   diphenhydrAMINE  (BENADRYL ) capsule 50 mg, 50 mg, Oral, Once **OR** diphenhydrAMINE  (BENADRYL ) injection 50 mg, 50 mg, Intravenous, Once, Dezii, Alexandra, DO   enoxaparin  (LOVENOX ) injection 40 mg, 40 mg, Subcutaneous, Q24H, Cleatus Delayne V, MD, 40 mg at 03/22/24 9146   escitalopram  (LEXAPRO ) tablet 10 mg, 10 mg, Oral, Daily, Cleatus Delayne V, MD, 10 mg at 03/22/24 9146   ferrous sulfate  tablet 325 mg, 325 mg, Oral, BID WC, Cleatus Delayne V, MD, 325 mg at 03/22/24 9146   fluticasone  furoate-vilanterol (BREO ELLIPTA ) 100-25 MCG/ACT 1 puff, 1 puff, Inhalation, Daily, Awanda [place], MD, 1 puff at 03/22/24 0854   furosemide  (LASIX ) tablet 20 mg, 20 mg, Oral, Daily PRN, Cleatus Delayne GAILS, MD   guaiFENesin -dextromethorphan  (ROBITUSSIN DM) 100-10 MG/5ML syrup 30 mL, 30 mL, Oral, Q4H PRN, Cleatus Delayne GAILS, MD   hydrALAZINE  (APRESOLINE ) injection 10 mg, 10 mg, Intravenous, Q6H PRN, Awanda [place], MD   insulin  aspart (novoLOG ) injection 0-20 Units, 0-20 Units, Subcutaneous, TID WC, Cleatus Delayne GAILS, MD, 3 Units at 03/22/24 1237   insulin  aspart (novoLOG ) injection 0-5 Units, 0-5 Units, Subcutaneous, QHS, Cleatus Delayne GAILS, MD, 3 Units at 03/20/24 0134   insulin  glargine-yfgn (SEMGLEE ) injection 5 Units, 5 Units, Subcutaneous, Daily, Awanda [place], MD, 5 Units at 03/22/24 0902   ipratropium-albuterol  (DUONEB) 0.5-2.5 (3) MG/3ML nebulizer solution 3 mL, 3 mL, Nebulization, TID, Awanda [place], MD, 3 mL at 03/22/24 1433   losartan  (COZAAR ) tablet 25 mg, 25 mg, Oral,  Daily, Cleatus Delayne V, MD, 25 mg at 03/22/24 0853   methylPREDNISolone  sodium succinate (SOLU-MEDROL ) 40 mg/mL injection 40 mg, 40 mg, Intravenous, Q12H, Dezii, Alexandra, DO, 40 mg at 03/22/24 1158   metoprolol  succinate (TOPROL -XL) 24 hr tablet 50 mg, 50 mg,  Oral, Daily, Cleatus Delayne GAILS, MD, 50 mg at 03/22/24 9146   ondansetron  (ZOFRAN ) tablet 4 mg, 4 mg, Oral, Q6H PRN **OR** ondansetron  (ZOFRAN ) injection 4 mg, 4 mg, Intravenous, Q6H PRN, Cleatus Delayne GAILS, MD   oxyCODONE -acetaminophen  (PERCOCET/ROXICET) 5-325 MG per tablet 1 tablet, 1 tablet, Oral, Q6H PRN, Cleatus Delayne GAILS, MD, 1 tablet at 03/22/24 0902   potassium chloride  (KLOR-CON  M) CR tablet 10 mEq, 10 mEq, Oral, QODAY, Cleatus Delayne GAILS, MD, 10 mEq at 03/22/24 9146   predniSONE  (DELTASONE ) tablet 50 mg, 50 mg, Oral, Q6H, Dezii, Alexandra, DO   pregabalin  (LYRICA ) capsule 75 mg, 75 mg, Oral, BID, Awanda [place], MD, 75 mg at 03/22/24 9146   sodium chloride  HYPERTONIC 3 % nebulizer solution 4 mL, 4 mL, Nebulization, BID, Awanda [place], MD, 4 mL at 03/22/24 9270   vitamin B-12 (CYANOCOBALAMIN ) tablet 100 mcg, 100 mcg, Oral, Daily, Dezii, Alexandra, DO, 100 mcg at 03/22/24 1158    ALLERGIES   Iodinated contrast media, Trelegy ellipta  [fluticasone -umeclidin-vilant], and Bactoshield chg [chlorhexidine  gluconate]     REVIEW OF SYSTEMS    Review of Systems:  Gen:  Denies  fever, sweats, chills weigh loss  HEENT: Denies blurred vision, double vision, ear pain, eye pain, hearing loss, nose bleeds, sore throat Cardiac:  No dizziness, chest pain or heaviness, chest tightness,edema Resp:   reports dyspnea chronically  Gi: Denies swallowing difficulty, stomach pain, nausea or vomiting, diarrhea, constipation, bowel incontinence Gu:  Denies bladder incontinence, burning urine Ext:   Denies Joint pain, stiffness or swelling Skin: Denies  skin rash, easy bruising or bleeding or hives Endoc:  Denies polyuria, polydipsia , polyphagia or weight  change Psych:   Denies depression, insomnia or hallucinations   Other:  All other systems negative   VS: BP (!) 167/93 (BP Location: Right Arm)   Pulse 77   Temp 98.3 F (36.8 C)   Resp 17   Ht 4' 11 (1.499 m)   Wt 55 kg   SpO2 93%   BMI 24.50 kg/m      PHYSICAL EXAM    GENERAL:NAD, no fevers, chills, no weakness no fatigue HEAD: Normocephalic, atraumatic.  EYES: Pupils equal, round, reactive to light. Extraocular muscles intact. No scleral icterus.  MOUTH: Moist mucosal membrane. Dentition intact. No abscess noted.  EAR, NOSE, THROAT: Clear without exudates. No external lesions.  NECK: Supple. No thyromegaly. No nodules. No JVD.  PULMONARY: decreased breath sounds with mild rhonchi worse at bases bilaterally.  CARDIOVASCULAR: S1 and S2. Regular rate and rhythm. No murmurs, rubs, or gallops. No edema. Pedal pulses 2+ bilaterally.  GASTROINTESTINAL: Soft, nontender, nondistended. No masses. Positive bowel sounds. No hepatosplenomegaly.  MUSCULOSKELETAL: No swelling, clubbing, or edema. Range of motion full in all extremities.  NEUROLOGIC: Cranial nerves II through XII are intact. No gross focal neurological deficits. Sensation intact. Reflexes intact.  SKIN: No ulceration, lesions, rashes, or cyanosis. Skin warm and dry. Turgor intact.  PSYCHIATRIC: Mood, affect within normal limits. The patient is awake, alert and oriented x 3. Insight, judgment intact.       IMAGING   Narrative & Impression  EXAM: CTA of the Chest with contrast for PE 03/23/2024 07:47:00 AM   TECHNIQUE: CTA of the chest was performed after the administration of intravenous contrast. Multiplanar reformatted images are provided for review. MIP images are provided for review. Automated exposure control, iterative reconstruction, and/or weight based adjustment of the mA/kV was utilized to reduce the radiation dose to as low as reasonably achievable.  COMPARISON: CT pulmonary angiogram dated  01/28/2024.   CLINICAL HISTORY: Dyspnea, chronic, chest wall or pleura disease suspected; Prior lung cancer, recurrent pneumothorax, known COPD, has been treated for pneumonia during this admission. Ongoing hemoptysis and dyspnea. Concern for malignancy vs PE. Allergic patient received 13-hour allergy prep; Hospital course: LUCI BELLUCCI is a 68 year old female with COPD on 4 L nasal cannula at baseline, right lung cancer status post radiation therapy, recurrent right pneumothorax x 2 most recently August 2024, history of heart failure with preserved EF 50%, hypertension, diabetes, PAD, was admitted with acute respiratory failure requiring BiPAP. On EMS arrival to the home she was saturating in the 50s on room air and endorsed recent fever and cough. She was admitted for treatment of community-acquired pneumonia.   FINDINGS:   PULMONARY ARTERIES: Pulmonary arteries are adequately opacified for evaluation. No pulmonary embolism.   MEDIASTINUM: The heart is borderline size. A leadless cardiac pacemaker is noted within the right ventricle.   LYMPH NODES: No mediastinal, hilar or axillary lymphadenopathy.   LUNGS AND PLEURA: There are patchy and coarse areas of opacification/consolidation within the right lower lobe. There is moderate central lobular emphysema. There is mild right-sided pleural effusion/pleural thickening.   UPPER ABDOMEN: Limited images of the upper abdomen are unremarkable.   SOFT TISSUES AND BONES: No acute bone or soft tissue abnormality.   IMPRESSION: 1. No evidence of pulmonary embolism. 2. Patchy and coarse areas of opacification/consolidation within the right lower lobe, possibly related to recent pneumonia. 3. Mild right-sided pleural effusion/pleural thickening. 4. Moderate central lobular emphysema.   Electronically signed by: evalene coho 03/23/2024 08:14 AM EDT RP Workstation: [id number]      ASSESSMENT/PLAN   Non massive hemoptysis     - patient may have PE - there is CTA in progress    - additional etiology may include cancer progression, necrotizing pneumonia, medication related hemoptysis, CHF related hemoptysis    - CRP    - RVP and COVID testing    - procalcitonin trend    - respiratory cultures    - nebulized txA q8h    - continue Rocephin  zithromax  for CAP coverage    - dc hypertonic saline as it may potentiate hemoptysis   Right pleural effusion      With associated compressive atelectasis    - Currently not big enough for thoracentesis - will attempt to diurese and consider IR consult -increased lasix  to 40 IV from 20 and reduce losartan  to 12.5mg  daily IV  Acute exacerbation of COPD    - continue current care path    - noted patient on nebulizer therapy     - continue IS    - steroids and antibiotics as current   Acute on chronic hypoxemic respiratory failure     - wean O2 to goal >88%     - chest physiotherapy and PT           Thank you for allowing me to participate in the care of this patient.   Patient/Family are satisfied with care plan and all questions have been answered.    Provider disclosure: Patient with at least one acute or chronic illness or injury that poses a threat to life or bodily function and is being managed actively during this encounter.  All of the below services have been performed independently by signing provider:  review of prior documentation from internal and or external health records.  Review of previous and current lab results.  Interview and comprehensive  assessment during patient visit today. Review of current and previous chest radiographs/CT scans. Discussion of management and test interpretation with health care team and patient/family.   This document was prepared using Dragon voice recognition software and may include unintentional dictation errors.     Zafar Debrosse, M.D.  Division of Pulmonary & Critical Care Medicine

## 2024-03-22 NOTE — Progress Notes (Signed)
 PROGRESS NOTE    Angela Jensen  FMW:[id number] DOB: 07-09-1956 DOA: 03/19/2024 PCP: Liana Fish, NP  Chief Complaint  Patient presents with   Respiratory Distress    Hospital Course:  Angela Jensen is a 68 year old female with COPD on 4 L nasal cannula at baseline, right lung cancer status post radiation therapy, recurrent right pneumothorax x 2 most recently August 2024, history of heart failure with preserved EF 50%, hypertension, diabetes, PAD, was admitted with acute respiratory failure requiring BiPAP.  On EMS arrival to the home she was saturating in the 50s on room air and endorsed recent fever and cough.  She was admitted for treatment of community-acquired pneumonia  Subjective: This morning patient reports that she is starting to feel better.  Still having some hemoptysis.  Also having worsening wheeze  Objective: Vitals:   03/21/24 2057 03/21/24 2058 03/22/24 0350 03/22/24 0716  BP: (!) 169/86 (!) 169/99 (!) 149/91 (!) 167/93  Pulse: 75 84 77 77  Resp:  17 20 17   Temp:  97.8 F (36.6 C) 98.7 F (37.1 C) 98.3 F (36.8 C)  TempSrc:  Oral Oral   SpO2:  98% 93% 97%  Weight:      Height:        Intake/Output Summary (Last 24 hours) at 03/22/2024 0839 Last data filed at 03/21/2024 1901 Gross per 24 hour  Intake 360 ml  Output --  Net 360 ml   Filed Weights   03/19/24 2239  Weight: 55 kg    Examination: General exam: Appears calm and comfortable, NAD  Respiratory system: No work of breathing, symmetric chest wall expansion, end expiratory wheezing, 4 L nasal cannula.  Appreciated small-volume hemoptysis and coughing at bedside Cardiovascular system: S1 & S2 heard, RRR.  Gastrointestinal system: Abdomen is nondistended, soft and nontender.  Neuro: Alert and oriented. No focal neurological deficits. Extremities: Symmetric, expected ROM Skin: No rashes, lesions Psychiatry: Demonstrates appropriate judgement and insight. Mood & affect appropriate for situation.    Assessment & Plan:  Principal Problem:   COPD with acute exacerbation (HCC) Active Problems:   Acute on chronic hypoxic respiratory failure (HCC)   Acute on chronic respiratory failure with hypoxia secondary to community-acquired pneumonia Hemoptysis - Chronically on 4 L O2 at baseline - CXR on arrival with right basilar airspace disease - Continue ceftriaxone  azithromycin  - Continue with hypertonic saline nebs twice daily with DuoNebs - No CT this admission.  Patient is at her respiratory baseline now.  Recent CT scan in June shows no new masses.  Hemoptysis may be secondary to CAP but given hx of malignancy and high risk of PE, will proceed with CTA now. - Patient follows with Dr. Parris outpatient, have alerted him.   COPD, in exacerbation - Patient is now actively wheezing.  Will initiate steroids - Continue with as scheduled DuoNebs and home bronchodilators.  AKI - Baseline creatinine 0.68, creatinine 1.09 on arrival - Has now improved to baseline status with IV fluids - Continue oral hydration  Chronic diastolic CHF - Last echo 01/2024: EF 60 to 65%, grade 1 diastolic dysfunction - Continue home meds  History of lung cancer, right lower lobe History of recurrent right pneumothorax - No evidence of pneumothorax on this admission - Follows outpatient with oncology  Hypertension - Continue home meds, monitor closely.  Titrate as needed  Type 2 diabetes without complication Hyperglycemia - Continue with sliding scale insulin .  Basal/bolus titration daily  PAD - Continue aspirin  and Plavix   Anxiety Depression -  Continue home dose Lexapro  and Xanax   DVT prophylaxis: Lovenox    Code Status: Full Code Disposition: Inpatient pending further resolution.  On IV steroids now  Consultants:    Procedures:    Antimicrobials:  Anti-infectives (From admission, onward)    Start     Dose/Rate Route Frequency Ordered Stop   03/21/24 0000  cefTRIAXone  (ROCEPHIN ) 2  g in sodium chloride  0.9 % 100 mL IVPB        2 g 200 mL/hr over 30 Minutes Intravenous Every 24 hours 03/20/24 0009 03/24/24 2359   03/21/24 0000  azithromycin  (ZITHROMAX ) 500 mg in sodium chloride  0.9 % 250 mL IVPB        500 mg 250 mL/hr over 60 Minutes Intravenous Every 24 hours 03/20/24 0009 03/24/24 2359   03/20/24 0030  cefTRIAXone  (ROCEPHIN ) 1 g in sodium chloride  0.9 % 100 mL IVPB        1 g 200 mL/hr over 30 Minutes Intravenous  Once 03/20/24 0026 03/20/24 0238   03/19/24 2345  azithromycin  (ZITHROMAX ) 500 mg in sodium chloride  0.9 % 250 mL IVPB        500 mg 250 mL/hr over 60 Minutes Intravenous  Once 03/19/24 2333 03/20/24 0126   03/19/24 2345  cefTRIAXone  (ROCEPHIN ) 1 g in sodium chloride  0.9 % 100 mL IVPB        1 g 200 mL/hr over 30 Minutes Intravenous  Once 03/19/24 2333 03/20/24 0010       Data Reviewed: I have personally reviewed following labs and imaging studies CBC: Recent Labs  Lab 03/19/24 2242 03/20/24 0454 03/22/24 0436  WBC 9.5 9.0 7.0  NEUTROABS 5.9  --   --   HGB 11.2* 10.3* 9.6*  HCT 38.2 34.2* 32.0*  MCV 97.0 95.5 94.7  PLT 270 206 203   Basic Metabolic Panel: Recent Labs  Lab 03/19/24 2242 03/20/24 0454 03/22/24 0436  NA 140 143 142  K 4.2 4.6 3.8  CL 102 104 105  CO2 26 28 32  GLUCOSE 320* 227* 83  BUN 16 16 16   CREATININE 1.09* 0.74 0.57  CALCIUM  8.4* 8.1* 8.4*  MG  --   --  2.1   GFR: Estimated Creatinine Clearance: 50.9 mL/min (by C-G formula based on SCr of 0.57 mg/dL). Liver Function Tests: Recent Labs  Lab 03/19/24 2242  AST 129*  ALT 64*  ALKPHOS 116  BILITOT 0.4  PROT 7.3  ALBUMIN 3.8   CBG: Recent Labs  Lab 03/21/24 1514 03/21/24 1619 03/21/24 1836 03/21/24 2111 03/22/24 0718  GLUCAP 131* 185* 109* 93 84    Recent Results (from the past 240 hours)  Resp panel by RT-PCR (RSV, Flu A&B, Covid) Anterior Nasal Swab     Status: None   Collection Time: 03/19/24 11:40 PM   Specimen: Anterior Nasal Swab   Result Value Ref Range Status   SARS Coronavirus 2 by RT PCR NEGATIVE NEGATIVE Final    Comment: (NOTE) SARS-CoV-2 target nucleic acids are NOT DETECTED.  The SARS-CoV-2 RNA is generally detectable in upper respiratory specimens during the acute phase of infection. The lowest concentration of SARS-CoV-2 viral copies this assay can detect is 138 copies/mL. A negative result does not preclude SARS-Cov-2 infection and should not be used as the sole basis for treatment or other patient management decisions. A negative result may occur with  improper specimen collection/handling, submission of specimen other than nasopharyngeal swab, presence of viral mutation(s) within the areas targeted by this assay, and inadequate number of viral  copies(<138 copies/mL). A negative result must be combined with clinical observations, patient history, and epidemiological information. The expected result is Negative.  Fact Sheet for Patients:  BloggerCourse.com  Fact Sheet for Healthcare Providers:  SeriousBroker.it  This test is no t yet approved or cleared by the [place]  FDA and  has been authorized for detection and/or diagnosis of SARS-CoV-2 by FDA under an Emergency Use Authorization (EUA). This EUA will remain  in effect (meaning this test can be used) for the duration of the COVID-19 declaration under Section 564(b)(1) of the Act, 21 U.S.C.section 360bbb-3(b)(1), unless the authorization is terminated  or revoked sooner.       Influenza A by PCR NEGATIVE NEGATIVE Final   Influenza B by PCR NEGATIVE NEGATIVE Final    Comment: (NOTE) The Xpert Xpress SARS-CoV-2/FLU/RSV plus assay is intended as an aid in the diagnosis of influenza from Nasopharyngeal swab specimens and should not be used as a sole basis for treatment. Nasal washings and aspirates are unacceptable for Xpert Xpress SARS-CoV-2/FLU/RSV testing.  Fact Sheet for  Patients: BloggerCourse.com  Fact Sheet for Healthcare Providers: SeriousBroker.it  This test is not yet approved or cleared by the [place]  FDA and has been authorized for detection and/or diagnosis of SARS-CoV-2 by FDA under an Emergency Use Authorization (EUA). This EUA will remain in effect (meaning this test can be used) for the duration of the COVID-19 declaration under Section 564(b)(1) of the Act, 21 U.S.C. section 360bbb-3(b)(1), unless the authorization is terminated or revoked.     Resp Syncytial Virus by PCR NEGATIVE NEGATIVE Final    Comment: (NOTE) Fact Sheet for Patients: BloggerCourse.com  Fact Sheet for Healthcare Providers: SeriousBroker.it  This test is not yet approved or cleared by the [place]  FDA and has been authorized for detection and/or diagnosis of SARS-CoV-2 by FDA under an Emergency Use Authorization (EUA). This EUA will remain in effect (meaning this test can be used) for the duration of the COVID-19 declaration under Section 564(b)(1) of the Act, 21 U.S.C. section 360bbb-3(b)(1), unless the authorization is terminated or revoked.  Performed at [facility], [address] [place], [place] [address]      Radiology Studies: No results found.  Scheduled Meds:  aspirin  EC  81 mg Oral Daily   atorvastatin   10 mg Oral Daily   clopidogrel   75 mg Oral Daily   enoxaparin  (LOVENOX ) injection  40 mg Subcutaneous Q24H   escitalopram   10 mg Oral Daily   ferrous sulfate   325 mg Oral BID WC   fluticasone  furoate-vilanterol  1 puff Inhalation Daily   insulin  aspart  0-20 Units Subcutaneous TID WC   insulin  aspart  0-5 Units Subcutaneous QHS   insulin  glargine-yfgn  5 Units Subcutaneous Daily   ipratropium-albuterol   3 mL Nebulization TID   losartan   25 mg Oral Daily   metoprolol  succinate  50 mg Oral Daily   potassium chloride   10  mEq Oral QODAY   pregabalin   75 mg Oral BID   sodium chloride  HYPERTONIC  4 mL Nebulization BID   Continuous Infusions:  azithromycin  500 mg (03/21/24 2350)   cefTRIAXone  (ROCEPHIN )  IV 2 g (03/21/24 2345)     LOS: 2 days  MDM: Patient is high risk for one or more organ failure.  They necessitate ongoing hospitalization for continued IV therapies and subsequent lab monitoring. Total time spent interpreting labs and vitals, reviewing the medical record, coordinating care amongst consultants and care team members, directly assessing and discussing care  with the patient and/or family: 20 min  Lorane Poland, DO [facility]  To contact the attending physician between 7A-7P please use Epic Chat. To contact the covering physician during after hours 7P-7A, please review Amion.  03/22/2024, 8:39 AM   *This document has been created with the assistance of dictation software. Please excuse typographical errors. *

## 2024-03-22 NOTE — Plan of Care (Signed)
  Problem: Education: Goal: Ability to describe self-care measures that may prevent or decrease complications (Diabetes Survival Skills Education) will improve Outcome: Progressing Goal: Individualized Educational Video(s) Outcome: Progressing   Problem: Nutritional: Goal: Maintenance of adequate nutrition will improve Outcome: Progressing Goal: Progress toward achieving an optimal weight will improve Outcome: Progressing   Problem: Skin Integrity: Goal: Risk for impaired skin integrity will decrease Outcome: Progressing   Problem: Clinical Measurements: Goal: Ability to maintain clinical measurements within normal limits will improve Outcome: Progressing Goal: Will remain free from infection Outcome: Progressing Goal: Diagnostic test results will improve Outcome: Progressing Goal: Respiratory complications will improve Outcome: Progressing Goal: Cardiovascular complication will be avoided Outcome: Progressing

## 2024-03-23 ENCOUNTER — Inpatient Hospital Stay

## 2024-03-23 DIAGNOSIS — J929 Pleural plaque without asbestos: Secondary | ICD-10-CM | POA: Diagnosis not present

## 2024-03-23 DIAGNOSIS — C3431 Malignant neoplasm of lower lobe, right bronchus or lung: Secondary | ICD-10-CM | POA: Diagnosis not present

## 2024-03-23 DIAGNOSIS — J441 Chronic obstructive pulmonary disease with (acute) exacerbation: Secondary | ICD-10-CM | POA: Diagnosis not present

## 2024-03-23 DIAGNOSIS — J939 Pneumothorax, unspecified: Secondary | ICD-10-CM | POA: Diagnosis not present

## 2024-03-23 DIAGNOSIS — J90 Pleural effusion, not elsewhere classified: Secondary | ICD-10-CM | POA: Diagnosis not present

## 2024-03-23 LAB — GLUCOSE, CAPILLARY
Glucose-Capillary: 264 mg/dL — ABNORMAL HIGH (ref 70–99)
Glucose-Capillary: 276 mg/dL — ABNORMAL HIGH (ref 70–99)
Glucose-Capillary: 319 mg/dL — ABNORMAL HIGH (ref 70–99)
Glucose-Capillary: 70 mg/dL (ref 70–99)

## 2024-03-23 LAB — EXPECTORATED SPUTUM ASSESSMENT W GRAM STAIN, RFLX TO RESP C

## 2024-03-23 MED ORDER — PREDNISONE 20 MG PO TABS
50.0000 mg | ORAL_TABLET | Freq: Every day | ORAL | Status: DC
  Administered 2024-03-24: 50 mg via ORAL
  Filled 2024-03-23: qty 1

## 2024-03-23 MED ORDER — FUROSEMIDE 10 MG/ML IJ SOLN
40.0000 mg | Freq: Every day | INTRAMUSCULAR | Status: DC
  Administered 2024-03-23 – 2024-03-24 (×2): 40 mg via INTRAVENOUS
  Filled 2024-03-23 (×2): qty 4

## 2024-03-23 MED ORDER — LOSARTAN POTASSIUM 25 MG PO TABS
12.5000 mg | ORAL_TABLET | Freq: Every day | ORAL | Status: DC
  Administered 2024-03-24: 12.5 mg via ORAL
  Filled 2024-03-23: qty 1

## 2024-03-23 MED ORDER — IOHEXOL 350 MG/ML SOLN
75.0000 mL | Freq: Once | INTRAVENOUS | Status: AC | PRN
  Administered 2024-03-23: 75 mL via INTRAVENOUS

## 2024-03-23 NOTE — Evaluation (Signed)
 Physical Therapy Evaluation Patient Details Name: Angela Jensen MRN: [id number] DOB: 1955-11-14 Today's Date: 03/23/2024  History of Present Illness  Angela Jensen is a 68 y.o. female with medical history significant for COPD on 4 L nasal cannula at baseline, right lung cancer, s/p radiation therapy, recurrent right pneumothorax x 2 most recently in August 2024  as well as history of CHF (EF 50% 04/2022), HTN, DM and PAD, being admitted with a COPD exacerbation requiring BiPAP.  On arrival of EMS to the home she was apparently on room air and saturating in the 50s.  Clinical Impression  Patient received in bed, she is agreeable to PT assessment. She reports she has been getting up to bathroom. Patient is independent with bed mobility and transfers/ambulates with supervision and RW. Patient limited by sob and low O2 sats with mobility ( down to 80% on 4 liters). She will continue to benefit from skilled PT to improve endurance and safety with mobility.         If plan is discharge home, recommend the following: Help with stairs or ramp for entrance   Can travel by private vehicle    yes    Equipment Recommendations None recommended by PT  Recommendations for Other Services       Functional Status Assessment Patient has had a recent decline in their functional status and demonstrates the ability to make significant improvements in function in a reasonable and predictable amount of time.     Precautions / Restrictions Precautions Precautions: None Recall of Precautions/Restrictions: Intact Precaution/Restrictions Comments: mod fall Restrictions Weight Bearing Restrictions Per Provider Order: No      Mobility  Bed Mobility Overal bed mobility: Modified Independent                  Transfers Overall transfer level: Modified independent Equipment used: Rolling walker (2 wheels)                    Ambulation/Gait Ambulation/Gait assistance: Supervision Gait Distance  (Feet): 25 Feet Assistive device: Rolling walker (2 wheels) Gait Pattern/deviations: Step-through pattern Gait velocity: WFL     General Gait Details: patient ambulating to bathroom independently with walker. No LOB, O2 sats drop with mobility  Stairs            Wheelchair Mobility     Tilt Bed    Modified Rankin (Stroke Patients Only)       Balance Overall balance assessment: Modified Independent                                           Pertinent Vitals/Pain Pain Assessment Pain Assessment: No/denies pain    Home Living Family/patient expects to be discharged to:: Private residence Living Arrangements: Spouse/significant other Available Help at Discharge: Family;Available 24 hours/day Type of Home: House Home Access: Stairs to enter Entrance Stairs-Rails: Right Entrance Stairs-Number of Steps: 2   Home Layout: One level Home Equipment: Agricultural consultant (2 wheels);Cane - single Higher education careers adviser (4 wheels) Additional Comments: On 3-4L/min O2 at all times.    Prior Function Prior Level of Function : Independent/Modified Independent;Driving             Mobility Comments: PRN use of rollator, otherwise walks without any AD (primarily uses the rollator while cooking, so she can sit). Denies falls in the past 6 months. Cares for 46 month old  great grandchild during the day. ADLs Comments: Independent with bathing & dressing.     Extremity/Trunk Assessment   Upper Extremity Assessment Upper Extremity Assessment: Overall WFL for tasks assessed    Lower Extremity Assessment Lower Extremity Assessment: Overall WFL for tasks assessed    Cervical / Trunk Assessment Cervical / Trunk Assessment: Normal  Communication   Communication Communication: Impaired Factors Affecting Communication: Hearing impaired    Cognition Arousal: Alert Behavior During Therapy: WFL for tasks assessed/performed   PT - Cognitive  impairments: No apparent impairments                         Following commands: Intact       Cueing Cueing Techniques: Verbal cues     General Comments      Exercises     Assessment/Plan    PT Assessment Patient needs continued PT services  PT Problem List Cardiopulmonary status limiting activity;Decreased activity tolerance;Decreased mobility       PT Treatment Interventions Gait training;Stair training;Functional mobility training;Therapeutic activities;Therapeutic exercise;Patient/family education    PT Goals (Current goals can be found in the Care Plan section)  Acute Rehab PT Goals Patient Stated Goal: return home PT Goal Formulation: With patient Time For Goal Achievement: 03/30/24 Potential to Achieve Goals: Good    Frequency Min 2X/week     Co-evaluation               AM-PAC PT 6 Clicks Mobility  Outcome Measure Help needed turning from your back to your side while in a flat bed without using bedrails?: None Help needed moving from lying on your back to sitting on the side of a flat bed without using bedrails?: None Help needed moving to and from a bed to a chair (including a wheelchair)?: None Help needed standing up from a chair using your arms (e.g., wheelchair or bedside chair)?: None Help needed to walk in hospital room?: A Little Help needed climbing 3-5 steps with a railing? : A Little 6 Click Score: 22    End of Session Equipment Utilized During Treatment: Oxygen  Activity Tolerance: Other (comment) (SOB, O2 sats) Patient left: in bed;with call bell/phone within reach Nurse Communication: Mobility status PT Visit Diagnosis: Muscle weakness (generalized) (M62.81);Difficulty in walking, not elsewhere classified (R26.2)    Time: 1445-1500 PT Time Calculation (min) (ACUTE ONLY): 15 min   Charges:   PT Evaluation $PT Eval Low Complexity: 1 Low   PT General Charges $$ ACUTE PT VISIT: 1 Visit         Shalah Estelle, PT,  [facility] 03/23/24,3:13 PM

## 2024-03-23 NOTE — Progress Notes (Signed)
 PROGRESS NOTE    Angela Jensen  FMW:[id number] DOB: [birth year] DOA: 03/19/2024 PCP: Liana Fish, NP  Chief Complaint  Patient presents with   Respiratory Distress    Hospital Course:  Angela Jensen is a 68 year old female with COPD on 4 L nasal cannula at baseline, right lung cancer status post radiation therapy, recurrent right pneumothorax x 2 most recently August 2024, history of heart failure with preserved EF 50%, hypertension, diabetes, PAD, was admitted with acute respiratory failure requiring BiPAP.  On EMS arrival to the home she was saturating in the 50s on room air and endorsed recent fever and cough.  She was admitted for treatment of community-acquired pneumonia  Subjective: No acute events overnight.  Patient reports she is feeling much better today.  She endorses significant improvement since initiating the steroids.  She also says no further hemoptysis.  She believes she is feeling strong enough to consider discharge tomorrow.  Objective: Vitals:   03/23/24 0512 03/23/24 0704 03/23/24 0808 03/23/24 1350  BP:   136/84   Pulse:   74   Resp:   14   Temp:   97.8 F (36.6 C)   TempSrc:      SpO2: 96% 98% 100% 96%  Weight:      Height:        Intake/Output Summary (Last 24 hours) at 03/23/2024 1425 Last data filed at 03/23/2024 1408 Gross per 24 hour  Intake 0 ml  Output 100 ml  Net -100 ml   Filed Weights   03/19/24 2239  Weight: 55 kg    Examination: General exam: Appears calm and comfortable, NAD  Respiratory system: No work of breathing, symmetric chest wall expansion, improved end expiratory wheezing, 4 L nasal cannula.  Cardiovascular system: S1 & S2 heard, RRR.  Gastrointestinal system: Abdomen is nondistended, soft and nontender.  Neuro: Alert and oriented. No focal neurological deficits. Extremities: Symmetric, expected ROM Skin: No rashes, lesions Psychiatry: Demonstrates appropriate judgement and insight. Mood & affect appropriate for situation.    Assessment & Plan:  Principal Problem:   COPD with acute exacerbation (HCC) Active Problems:   Acute on chronic hypoxic respiratory failure (HCC)   Acute on chronic respiratory failure with hypoxia secondary to community-acquired pneumonia Hemoptysis - Chronically on 4 L O2 at baseline - CXR on arrival with right basilar airspace disease - Continue ceftriaxone  azithromycin  - Continue with hypertonic saline nebs twice daily with DuoNebs - With hemoptysis, now status post nebulized TXA hemoptysis has resolved. - CT this admission:Right-sided pleural effusion and thickening, moderate centrilobular emphysema, patchy and coarse areas of opacification and consolidation within right lower lobe. (Received pretreatment with steroid and Benadryl  given history of contrast allergy.  No issues reported.) - Has been seen by pulmonology, is established outpatient.  COPD, in exacerbation - Status post IV steroids.  On p.o. now.  Continues to improve - At baseline O2 requirement - Continue nebulizers as scheduled and as needed.  AKI - Baseline creatinine 0.68, creatinine 1.09 on arrival - Has now improved to baseline status with IV fluids - Continue oral hydration  Chronic diastolic CHF - Last echo 01/2024: EF 60 to 65%, grade 1 diastolic dysfunction - IV Lasix  for now.  Effusion seen on CT.  P.o. at DC.  History of lung cancer, right lower lobe History of recurrent right pneumothorax - No evidence of pneumothorax on this admission - Follows outpatient with oncology  Hypertension - Continue home meds, monitor closely.  Titrate as needed  Type 2  diabetes without complication Hyperglycemia - Acute worsening secondary to steroid therapy - Continue sliding scale and basal/bolus.  Titrate as needed  PAD - Continue aspirin  and Plavix   Anxiety Depression - Continue home dose Lexapro  and Xanax   DVT prophylaxis: Lovenox    Code Status: Full Code Disposition: Generalized deconditioning.   PT eval ordered.  Likely discharge home tomorrow with home health  Consultants:  Treatment Team:  Consulting Physician: Parris Manna, MD  Procedures:    Antimicrobials:  Anti-infectives (From admission, onward)    Start     Dose/Rate Route Frequency Ordered Stop   03/21/24 0000  cefTRIAXone  (ROCEPHIN ) 2 g in sodium chloride  0.9 % 100 mL IVPB        2 g 200 mL/hr over 30 Minutes Intravenous Every 24 hours 03/20/24 0009 03/24/24 2359   03/21/24 0000  azithromycin  (ZITHROMAX ) 500 mg in sodium chloride  0.9 % 250 mL IVPB        500 mg 250 mL/hr over 60 Minutes Intravenous Every 24 hours 03/20/24 0009 03/24/24 2359   03/20/24 0030  cefTRIAXone  (ROCEPHIN ) 1 g in sodium chloride  0.9 % 100 mL IVPB        1 g 200 mL/hr over 30 Minutes Intravenous  Once 03/20/24 0026 03/20/24 0238   03/19/24 2345  azithromycin  (ZITHROMAX ) 500 mg in sodium chloride  0.9 % 250 mL IVPB        500 mg 250 mL/hr over 60 Minutes Intravenous  Once 03/19/24 2333 03/20/24 0126   03/19/24 2345  cefTRIAXone  (ROCEPHIN ) 1 g in sodium chloride  0.9 % 100 mL IVPB        1 g 200 mL/hr over 30 Minutes Intravenous  Once 03/19/24 2333 03/20/24 0010       Data Reviewed: I have personally reviewed following labs and imaging studies CBC: Recent Labs  Lab 03/19/24 2242 03/20/24 0454 03/22/24 0436  WBC 9.5 9.0 7.0  NEUTROABS 5.9  --   --   HGB 11.2* 10.3* 9.6*  HCT 38.2 34.2* 32.0*  MCV 97.0 95.5 94.7  PLT 270 206 203   Basic Metabolic Panel: Recent Labs  Lab 03/19/24 2242 03/20/24 0454 03/22/24 0436  NA 140 143 142  K 4.2 4.6 3.8  CL 102 104 105  CO2 26 28 32  GLUCOSE 320* 227* 83  BUN 16 16 16   CREATININE 1.09* 0.74 0.57  CALCIUM  8.4* 8.1* 8.4*  MG  --   --  2.1   GFR: Estimated Creatinine Clearance: 50.9 mL/min (by C-G formula based on SCr of 0.57 mg/dL). Liver Function Tests: Recent Labs  Lab 03/19/24 2242  AST 129*  ALT 64*  ALKPHOS 116  BILITOT 0.4  PROT 7.3  ALBUMIN 3.8   CBG: Recent  Labs  Lab 03/22/24 1154 03/22/24 1553 03/22/24 2103 03/23/24 0810 03/23/24 1227  GLUCAP 124* 335* 215* 264* 276*    Recent Results (from the past 240 hours)  Resp panel by RT-PCR (RSV, Flu A&B, Covid) Anterior Nasal Swab     Status: None   Collection Time: 03/19/24 11:40 PM   Specimen: Anterior Nasal Swab  Result Value Ref Range Status   SARS Coronavirus 2 by RT PCR NEGATIVE NEGATIVE Final    Comment: (NOTE) SARS-CoV-2 target nucleic acids are NOT DETECTED.  The SARS-CoV-2 RNA is generally detectable in upper respiratory specimens during the acute phase of infection. The lowest concentration of SARS-CoV-2 viral copies this assay can detect is 138 copies/mL. A negative result does not preclude SARS-Cov-2 infection and should not be used  as the sole basis for treatment or other patient management decisions. A negative result may occur with  improper specimen collection/handling, submission of specimen other than nasopharyngeal swab, presence of viral mutation(s) within the areas targeted by this assay, and inadequate number of viral copies(<138 copies/mL). A negative result must be combined with clinical observations, patient history, and epidemiological information. The expected result is Negative.  Fact Sheet for Patients:  BloggerCourse.com  Fact Sheet for Healthcare Providers:  SeriousBroker.it  This test is no t yet approved or cleared by the [place]  FDA and  has been authorized for detection and/or diagnosis of SARS-CoV-2 by FDA under an Emergency Use Authorization (EUA). This EUA will remain  in effect (meaning this test can be used) for the duration of the COVID-19 declaration under Section 564(b)(1) of the Act, 21 U.S.C.section 360bbb-3(b)(1), unless the authorization is terminated  or revoked sooner.       Influenza A by PCR NEGATIVE NEGATIVE Final   Influenza B by PCR NEGATIVE NEGATIVE Final    Comment:  (NOTE) The Xpert Xpress SARS-CoV-2/FLU/RSV plus assay is intended as an aid in the diagnosis of influenza from Nasopharyngeal swab specimens and should not be used as a sole basis for treatment. Nasal washings and aspirates are unacceptable for Xpert Xpress SARS-CoV-2/FLU/RSV testing.  Fact Sheet for Patients: BloggerCourse.com  Fact Sheet for Healthcare Providers: SeriousBroker.it  This test is not yet approved or cleared by the [place]  FDA and has been authorized for detection and/or diagnosis of SARS-CoV-2 by FDA under an Emergency Use Authorization (EUA). This EUA will remain in effect (meaning this test can be used) for the duration of the COVID-19 declaration under Section 564(b)(1) of the Act, 21 U.S.C. section 360bbb-3(b)(1), unless the authorization is terminated or revoked.     Resp Syncytial Virus by PCR NEGATIVE NEGATIVE Final    Comment: (NOTE) Fact Sheet for Patients: BloggerCourse.com  Fact Sheet for Healthcare Providers: SeriousBroker.it  This test is not yet approved or cleared by the [place]  FDA and has been authorized for detection and/or diagnosis of SARS-CoV-2 by FDA under an Emergency Use Authorization (EUA). This EUA will remain in effect (meaning this test can be used) for the duration of the COVID-19 declaration under Section 564(b)(1) of the Act, 21 U.S.C. section 360bbb-3(b)(1), unless the authorization is terminated or revoked.  Performed at [facility], [address] [place], [place] [address]   Respiratory (~20 pathogens) panel by PCR     Status: None   Collection Time: 03/22/24  3:16 PM   Specimen: Nasopharyngeal Swab; Respiratory  Result Value Ref Range Status   Adenovirus NOT DETECTED NOT DETECTED Final   Coronavirus 229E NOT DETECTED NOT DETECTED Final    Comment: (NOTE) The Coronavirus on the Respiratory Panel,  DOES NOT test for the novel  Coronavirus (2019 nCoV)    Coronavirus HKU1 NOT DETECTED NOT DETECTED Final   Coronavirus NL63 NOT DETECTED NOT DETECTED Final   Coronavirus OC43 NOT DETECTED NOT DETECTED Final   Metapneumovirus NOT DETECTED NOT DETECTED Final   Rhinovirus / Enterovirus NOT DETECTED NOT DETECTED Final   Influenza A NOT DETECTED NOT DETECTED Final   Influenza B NOT DETECTED NOT DETECTED Final   Parainfluenza Virus 1 NOT DETECTED NOT DETECTED Final   Parainfluenza Virus 2 NOT DETECTED NOT DETECTED Final   Parainfluenza Virus 3 NOT DETECTED NOT DETECTED Final   Parainfluenza Virus 4 NOT DETECTED NOT DETECTED Final   Respiratory Syncytial Virus NOT DETECTED NOT DETECTED Final  Bordetella pertussis NOT DETECTED NOT DETECTED Final   Bordetella Parapertussis NOT DETECTED NOT DETECTED Final   Chlamydophila pneumoniae NOT DETECTED NOT DETECTED Final   Mycoplasma pneumoniae NOT DETECTED NOT DETECTED Final    Comment: Performed at [facility], [address] [place], [place] [address]  MRSA Next Gen by PCR, Nasal     Status: None   Collection Time: 03/22/24  3:16 PM   Specimen: Nasal Mucosa; Nasal Swab  Result Value Ref Range Status   MRSA by PCR Next Gen NOT DETECTED NOT DETECTED Final    Comment: (NOTE) The GeneXpert MRSA Assay (FDA approved for NASAL specimens only), is one component of a comprehensive MRSA colonization surveillance program. It is not intended to diagnose MRSA infection nor to guide or monitor treatment for MRSA infections. Test performance is not FDA approved in patients less than 55 years old. Performed at [facility], [address] [place], [place] [address]   SARS Coronavirus 2 by RT PCR (hospital order, performed in [facility] hospital lab) *cepheid single result test* Anterior Nasal Swab     Status: None   Collection Time: 03/22/24  3:16 PM   Specimen: Anterior Nasal Swab  Result Value Ref Range Status   SARS Coronavirus 2 by RT  PCR NEGATIVE NEGATIVE Final    Comment: (NOTE) SARS-CoV-2 target nucleic acids are NOT DETECTED.  The SARS-CoV-2 RNA is generally detectable in upper and lower respiratory specimens during the acute phase of infection. The lowest concentration of SARS-CoV-2 viral copies this assay can detect is 250 copies / mL. A negative result does not preclude SARS-CoV-2 infection and should not be used as the sole basis for treatment or other patient management decisions.  A negative result may occur with improper specimen collection / handling, submission of specimen other than nasopharyngeal swab, presence of viral mutation(s) within the areas targeted by this assay, and inadequate number of viral copies (<250 copies / mL). A negative result must be combined with clinical observations, patient history, and epidemiological information.  Fact Sheet for Patients:   RoadLapTop.co.za  Fact Sheet for Healthcare Providers: http://kim-miller.com/  This test is not yet approved or  cleared by the [place]  FDA and has been authorized for detection and/or diagnosis of SARS-CoV-2 by FDA under an Emergency Use Authorization (EUA).  This EUA will remain in effect (meaning this test can be used) for the duration of the COVID-19 declaration under Section 564(b)(1) of the Act, 21 U.S.C. section 360bbb-3(b)(1), unless the authorization is terminated or revoked sooner.  Performed at [facility], [address] [place], [place] [address]      Radiology Studies: CT Angio Chest Pulmonary Embolism (PE) W or WO Contrast Result Date: 03/23/2024 EXAM: CTA of the Chest with contrast for PE 03/23/2024 07:47:00 AM TECHNIQUE: CTA of the chest was performed after the administration of intravenous contrast. Multiplanar reformatted images are provided for review. MIP images are provided for review. Automated exposure control, iterative reconstruction, and/or  weight based adjustment of the mA/kV was utilized to reduce the radiation dose to as low as reasonably achievable. COMPARISON: CT pulmonary angiogram dated 01/28/2024. CLINICAL HISTORY: Dyspnea, chronic, chest wall or pleura disease suspected; Prior lung cancer, recurrent pneumothorax, known COPD, has been treated for pneumonia during this admission. Ongoing hemoptysis and dyspnea. Concern for malignancy vs PE. Allergic patient received 13-hour allergy prep; Hospital course: Angela Jensen is a 68 year old female with COPD on 4 L nasal cannula at baseline, right lung cancer  status post radiation therapy, recurrent right pneumothorax x 2 most recently August 2024, history of heart failure with preserved EF 50%, hypertension, diabetes, PAD, was admitted with acute respiratory failure requiring BiPAP. On EMS arrival to the home she was saturating in the 50s on room air and endorsed recent fever and cough. She was admitted for treatment of community-acquired pneumonia. FINDINGS: PULMONARY ARTERIES: Pulmonary arteries are adequately opacified for evaluation. No pulmonary embolism. MEDIASTINUM: The heart is borderline size. A leadless cardiac pacemaker is noted within the right ventricle. LYMPH NODES: No mediastinal, hilar or axillary lymphadenopathy. LUNGS AND PLEURA: There are patchy and coarse areas of opacification/consolidation within the right lower lobe. There is moderate central lobular emphysema. There is mild right-sided pleural effusion/pleural thickening. UPPER ABDOMEN: Limited images of the upper abdomen are unremarkable. SOFT TISSUES AND BONES: No acute bone or soft tissue abnormality. IMPRESSION: 1. No evidence of pulmonary embolism. 2. Patchy and coarse areas of opacification/consolidation within the right lower lobe, possibly related to recent pneumonia. 3. Mild right-sided pleural effusion/pleural thickening. 4. Moderate central lobular emphysema. Electronically signed by: evalene coho 03/23/2024 08:14  AM EDT RP Workstation: [id number]    Scheduled Meds:  aspirin  EC  81 mg Oral Daily   atorvastatin   10 mg Oral Daily   calcium  carbonate  500 mg of elemental calcium  Oral BID WC   clopidogrel   75 mg Oral Daily   enoxaparin  (LOVENOX ) injection  40 mg Subcutaneous Q24H   escitalopram   10 mg Oral Daily   ferrous sulfate   325 mg Oral BID WC   fluticasone  furoate-vilanterol  1 puff Inhalation Daily   furosemide   40 mg Intravenous Daily   insulin  aspart  0-20 Units Subcutaneous TID WC   insulin  aspart  0-5 Units Subcutaneous QHS   insulin  glargine-yfgn  5 Units Subcutaneous Daily   ipratropium-albuterol   3 mL Nebulization TID   [START ON 03/24/2024] losartan   12.5 mg Oral Daily   metoprolol  succinate  50 mg Oral Daily   potassium chloride   10 mEq Oral QODAY   [START ON 03/24/2024] predniSONE   50 mg Oral Q breakfast   pregabalin   75 mg Oral BID   tranexamic acid   500 mg Nebulization Q8H   vitamin B-12  100 mcg Oral Daily   Continuous Infusions:  azithromycin  500 mg (03/23/24 0051)   cefTRIAXone  (ROCEPHIN )  IV 2 g (03/22/24 2345)     LOS: 3 days  MDM: Patient is high risk for one or more organ failure.  They necessitate ongoing hospitalization for continued IV therapies and subsequent lab monitoring. Total time spent interpreting labs and vitals, reviewing the medical record, coordinating care amongst consultants and care team members, directly assessing and discussing care with the patient and/or family: 55 min  Nayzeth Altman, DO [facility]  To contact the attending physician between 7A-7P please use Epic Chat. To contact the covering physician during after hours 7P-7A, please review Amion.  03/23/2024, 2:25 PM   *This document has been created with the assistance of dictation software. Please excuse typographical errors. *

## 2024-03-23 NOTE — Inpatient Diabetes Management (Signed)
 Inpatient Diabetes Program Recommendations  AACE/ADA: New Consensus Statement on Inpatient Glycemic Control   Target Ranges:  Prepandial:   less than 140 mg/dL      Peak postprandial:   less than 180 mg/dL (1-2 hours)      Critically ill patients:  140 - 180 mg/dL    Latest Reference Range & Units 03/22/24 07:18 03/22/24 11:54 03/22/24 15:53 03/22/24 21:03 03/23/24 08:10  Glucose-Capillary 70 - 99 mg/dL 84 875 (H) 664 (H) 784 (H) 264 (H)   Review of Glycemic Control  Diabetes history: DM2 Outpatient Diabetes medications: Toujeo  10 units daily, Metformin  XR 1000 mg QAM Current orders for Inpatient glycemic control: Semglee  5 units daily, Novolog  0-20 units TID with meals, Novolog  0-5 units at bedtime; Solumedrol 40 mg Q12H  Inpatient Diabetes Program Recommendations:    Insulin : If steroids are continued as ordered, please consider increasing Semglee  to 10 units daily.  If patient is eating well, may want to consider ordering Novolog  2 units TID with meals for meal coverage if patient eats at least 50% of meals.  Thanks, Earnie Gainer, RN, MSN, CDCES Diabetes Coordinator Inpatient Diabetes Program [phone] (Team Pager from 8am to 5pm)

## 2024-03-23 NOTE — TOC Progression Note (Signed)
 Transition of Care [facility]) - Progression Note    Patient Details  Name: Angela Jensen MRN: [id number] Date of Birth: 05-25-1956  Transition of Care [facility]) CM/SW Contact  Corean ONEIDA Haddock, RN Phone Number: 03/23/2024, 3:00 PM  Clinical Narrative:      Patient active with [facility] home health for RN and PT.  Patient with chronic home O2                    Expected Discharge Plan and Services                                               Social Drivers of Health (SDOH) Interventions SDOH Screenings   Food Insecurity: No Food Insecurity (03/20/2024)  Housing: Low Risk  (03/20/2024)  Transportation Needs: No Transportation Needs (03/20/2024)  Utilities: Not At Risk (03/20/2024)  Alcohol Screen: Low Risk  (12/11/2021)  Depression (PHQ2-9): Low Risk  (10/13/2023)  Social Connections: Socially Integrated (03/20/2024)  Tobacco Use: Medium Risk (03/20/2024)    Readmission Risk Interventions    03/21/2024    3:56 PM 01/26/2024   10:40 AM  Readmission Risk Prevention Plan  Transportation Screening Complete Complete  PCP or Specialist Appt within 3-5 Days  Complete  HRI or Home Care Consult Complete   Social Work Consult for Recovery Care Planning/Counseling Complete Complete  Palliative Care Screening Not Applicable Not Applicable  Medication Review Oceanographer) Complete Complete

## 2024-03-23 NOTE — Plan of Care (Signed)

## 2024-03-23 NOTE — Progress Notes (Signed)
 PULMONOLOGY         Date: 03/23/2024,   MRN# [id number] OMEKA HOLBEN 1956-05-17     AdmissionWeight: 55 kg                 CurrentWeight: 55 kg  Referring provider: Dr Leesa    CHIEF COMPLAINT:   Non-massive hemoptysis   HISTORY OF PRESENT ILLNESS   68 yo F with hx of chronic anemia, Asthma and COPD overlap with right lung cancer and recent onset of hemoptysis.  She also has background of DM, PAD, HTN, Systolic CHF, recurrent right pneumothorax.  She shares that she was in her usual state of health when all of the sudden she noted cough and non massive hemoptysis which started appx 5 days prior to admission.  She attempted to wait for few days to see if bleeding will subside but it has not and she seeks ER Evaluation for this reason. She is on O2 during my evaluation and continues to cough up blood. She denies having chest pains and denies blood clots. Denies medication noncompliance, she does take asa and plavix . PCCM consultation for acute on chronic hypoxemia with non massive hemoptysis.   03/23/24- Patient has complete resolution of previously noted hemoptysis.  She is now closer to baseline and is improving.  We have transitioned off IV steroids and patient will be cleared for dc home in am  PAST MEDICAL HISTORY   Past Medical History:  Diagnosis Date   Anemia    Arthritis    Asthma    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/2016   Cancer (HCC) 2012   Right Lung CA   COPD (chronic obstructive pulmonary disease) (HCC)    Depression    Diabetes mellitus without complication (HCC)    Patient takes Janumet   Essential hypertension 05/26/2016   Heart failure (HCC) 2022   Hydropneumothorax 05/03/2020   Hypercholesteremia    Oxygen  dependent    2L at nite    PAD (peripheral artery disease) (HCC) 06/22/2016   Peripheral vascular disease (HCC)    Personal history of radiation therapy    Shortness of breath dyspnea     with exertion    Sialolithiasis    Sleep apnea    Wears dentures    full upper and lower     SURGICAL HISTORY   Past Surgical History:  Procedure Laterality Date   APPLICATION OF CELL SAVER Left 01/21/2024   Procedure: APPLICATION OF CELL SAVER;  Surgeon: Marea Selinda RAMAN, MD;  Location: [facility];  Service: Vascular;  Laterality: Left;   CESAREAN SECTION     x3   COLONOSCOPY WITH PROPOFOL  N/A 06/25/2015   Procedure: COLONOSCOPY WITH PROPOFOL ;  Surgeon: Rogelia Copping, MD;  Location: [facility];  Service: Endoscopy;  Laterality: N/A;   COLONOSCOPY WITH PROPOFOL  N/A 07/26/2020   Procedure: COLONOSCOPY WITH PROPOFOL ;  Surgeon: Copping Rogelia, MD;  Location: [facility];  Service: Endoscopy;  Laterality: N/A;   CYST REMOVAL LEG     and on shoulder    ENDARTERECTOMY FEMORAL Left 01/21/2024   Procedure: LEFT COMMON, SUPERFICIAL FEMORAL AND PROFUNDA FEMORIS ENDARTERECTOMY WITH BOVINE PERICARDIAL PATCH ANGIOPLASTY;  Surgeon: Marea Selinda RAMAN, MD;  Location: [facility];  Service: Vascular;  Laterality: Left;   ESOPHAGOGASTRODUODENOSCOPY (EGD) WITH PROPOFOL  N/A 07/26/2020   Procedure: ESOPHAGOGASTRODUODENOSCOPY (EGD) WITH PROPOFOL ;  Surgeon: Copping Rogelia, MD;  Location: [facility];  Service: Endoscopy;  Laterality: N/A;  Diabetic - oral  meds   INSERTION OF ILIAC STENT Left 01/21/2024   Procedure: INSERTION, STENT, ARTERY, ILIAC;  Surgeon: Marea Selinda RAMAN, MD;  Location: [facility];  Service: Vascular;  Laterality: Left;  AND SFA STENTS   LOWER EXTREMITY ANGIOGRAPHY Left 09/29/2018   Procedure: LOWER EXTREMITY ANGIOGRAPHY;  Surgeon: Marea Selinda RAMAN, MD;  Location: [facility];  Service: Cardiovascular;  Laterality: Left;   LOWER EXTREMITY ANGIOGRAPHY Left 07/29/2023   Procedure: Lower Extremity Angiography;  Surgeon: Marea Selinda RAMAN, MD;  Location: [facility];  Service: Cardiovascular;  Laterality: Left;   LOWER EXTREMITY ANGIOGRAPHY Right 12/20/2023   Procedure: Lower Extremity  Angiography;  Surgeon: Marea Selinda RAMAN, MD;  Location: [facility];  Service: Cardiovascular;  Laterality: Right;   LOWER EXTREMITY ANGIOGRAPHY Left 01/17/2024   Procedure: Lower Extremity Angiography;  Surgeon: Marea Selinda RAMAN, MD;  Location: [facility];  Service: Cardiovascular;  Laterality: Left;   LUNG BIOPSY  12/30/2011   has lung spots   PACEMAKER IMPLANT  07/14/2021   PACEMAKER LEADLESS INSERTION N/A 07/14/2021   Procedure: PACEMAKER LEADLESS INSERTION;  Surgeon: Ammon Blunt, MD;  Location: [facility];  Service: Cardiovascular;  Laterality: N/A;   PERIPHERAL VASCULAR CATHETERIZATION Left 06/01/2016   Procedure: Lower Extremity Angiography;  Surgeon: Selinda RAMAN Marea, MD;  Location: [facility];  Service: Cardiovascular;  Laterality: Left;   PERIPHERAL VASCULAR CATHETERIZATION N/A 06/01/2016   Procedure: Abdominal Aortogram w/Lower Extremity;  Surgeon: Selinda RAMAN Marea, MD;  Location: [facility];  Service: Cardiovascular;  Laterality: N/A;   PERIPHERAL VASCULAR CATHETERIZATION  06/01/2016   Procedure: Lower Extremity Intervention;  Surgeon: Selinda RAMAN Marea, MD;  Location: [facility];  Service: Cardiovascular;;   PERIPHERAL VASCULAR CATHETERIZATION Right 06/08/2016   Procedure: Lower Extremity Angiography;  Surgeon: Selinda RAMAN Marea, MD;  Location: [facility];  Service: Cardiovascular;  Laterality: Right;   PERIPHERAL VASCULAR CATHETERIZATION  06/08/2016   Procedure: Lower Extremity Intervention;  Surgeon: Selinda RAMAN Marea, MD;  Location: [facility];  Service: Cardiovascular;;   SUBMANDIBULAR GLAND EXCISION Left 12/06/2020   Procedure: EXCISION SUBMANDIBULAR GLAND;  Surgeon: Herminio Miu, MD;  Location: [facility];  Service: ENT;  Laterality: Left;  needs to be first case Diabetic - diet controlled   TEMPORARY PACEMAKER N/A 07/11/2021   Procedure: TEMPORARY PACEMAKER;  Surgeon: Ammon Blunt, MD;  Location: [facility];  Service: Cardiovascular;  Laterality: N/A;     FAMILY HISTORY   Family History  Problem Relation Age of Onset   Diabetes Mother    Hypercholesterolemia Mother    Lung cancer Father    Diabetes Sister    Diabetes Sister    Hypertension Sister    Diabetes Maternal Grandmother    Diabetes Paternal Grandmother    Heart attack Brother    Coronary artery disease Brother    Vascular Disease Brother    Heart attack Brother    Breast cancer Neg Hx      SOCIAL HISTORY   Social History   Tobacco Use   Smoking status: Former    Current packs/day: 0.00    Average packs/day: 1 pack/day for 37.0 years (37.0 ttl pk-yrs)    Types: Cigarettes    Start date: 02/06/1973    Quit date: 02/06/2010    Years since quitting: 14.1   Smokeless tobacco: Former    Types: Snuff  Vaping Use   Vaping status: Never Used  Substance  Use Topics   Alcohol use: Not Currently    Alcohol/week: 5.0 standard drinks of alcohol    Types: 5 Cans of beer per week    Comment: /h x of alcohol abuse -stopped 2012- now drinks 5 beer per week   Drug use: Not Currently    Types: Marijuana, Crack cocaine, Cocaine    Comment: hx of cocaine use- last use 2015; last use marijuana6/22/19,     MEDICATIONS    Home Medication:    Current Medication:  Current Facility-Administered Medications:    acetaminophen  (TYLENOL ) tablet 650 mg, 650 mg, Oral, Q6H PRN **OR** acetaminophen  (TYLENOL ) suppository 650 mg, 650 mg, Rectal, Q6H PRN, Tobie Cathaleen RAMAN, RPH   albuterol  (PROVENTIL ) (2.5 MG/3ML) 0.083% nebulizer solution 2.5 mg, 2.5 mg, Nebulization, Q2H PRN, Cleatus Hoof V, MD, 2.5 mg at 03/21/24 1202   ALPRAZolam  (XANAX ) tablet 0.5 mg, 0.5 mg, Oral, BID PRN, Cleatus Hoof GAILS, MD, 0.5 mg at 03/22/24 2127   aspirin  EC tablet 81 mg, 81 mg, Oral, Daily, Cleatus Hoof GAILS, MD, 81 mg at 03/23/24 9175   atorvastatin  (LIPITOR) tablet 10 mg, 10 mg, Oral, Daily, Cleatus Hoof V, MD, 10 mg at 03/23/24 9175    azithromycin  (ZITHROMAX ) 500 mg in sodium chloride  0.9 % 250 mL IVPB, 500 mg, Intravenous, Q24H, Cleatus Hoof GAILS, MD, Last Rate: 250 mL/hr at 03/23/24 0051, 500 mg at 03/23/24 0051   calcium  carbonate (OS-CAL - dosed in mg of elemental calcium ) tablet 1,250 mg, 500 mg of elemental calcium , Oral, BID WC, Dezii, Alexandra, DO, 1,250 mg at 03/23/24 0825   cefTRIAXone  (ROCEPHIN ) 2 g in sodium chloride  0.9 % 100 mL IVPB, 2 g, Intravenous, Q24H, Cleatus Hoof GAILS, MD, Last Rate: 200 mL/hr at 03/22/24 2345, 2 g at 03/22/24 2345   clopidogrel  (PLAVIX ) tablet 75 mg, 75 mg, Oral, Daily, Cleatus Hoof GAILS, MD, 75 mg at 03/23/24 0825   cyclobenzaprine  (FLEXERIL ) tablet 10 mg, 10 mg, Oral, QHS PRN, Cleatus Hoof GAILS, MD   enoxaparin  (LOVENOX ) injection 40 mg, 40 mg, Subcutaneous, Q24H, Cleatus Hoof GAILS, MD, 40 mg at 03/23/24 0825   escitalopram  (LEXAPRO ) tablet 10 mg, 10 mg, Oral, Daily, Cleatus Hoof V, MD, 10 mg at 03/23/24 9175   ferrous sulfate  tablet 325 mg, 325 mg, Oral, BID WC, Duncan, Hazel V, MD, 325 mg at 03/23/24 9175   fluticasone  furoate-vilanterol (BREO ELLIPTA ) 100-25 MCG/ACT 1 puff, 1 puff, Inhalation, Daily, Awanda City, MD, 1 puff at 03/23/24 9173   furosemide  (LASIX ) injection 40 mg, 40 mg, Intravenous, Daily, Axl Rodino, MD   guaiFENesin -dextromethorphan  (ROBITUSSIN DM) 100-10 MG/5ML syrup 30 mL, 30 mL, Oral, Q4H PRN, Cleatus Hoof GAILS, MD   hydrALAZINE  (APRESOLINE ) injection 10 mg, 10 mg, Intravenous, Q6H PRN, Awanda City, MD   insulin  aspart (novoLOG ) injection 0-20 Units, 0-20 Units, Subcutaneous, TID WC, Cleatus Hoof GAILS, MD, 11 Units at 03/23/24 0825   insulin  aspart (novoLOG ) injection 0-5 Units, 0-5 Units, Subcutaneous, QHS, Cleatus Hoof GAILS, MD, 2 Units at 03/22/24 2126   insulin  glargine-yfgn (SEMGLEE ) injection 5 Units, 5 Units, Subcutaneous, Daily, Lai, Tina, MD, 5 Units at 03/22/24 0902   ipratropium-albuterol  (DUONEB) 0.5-2.5 (3) MG/3ML nebulizer solution 3 mL, 3 mL, Nebulization, TID, Awanda City, MD, 3 mL at 03/23/24 0703   [START ON 03/24/2024] losartan  (COZAAR ) tablet 12.5 mg, 12.5 mg, Oral, Daily, Shalayah Beagley, MD   methylPREDNISolone  sodium succinate (SOLU-MEDROL ) 40 mg/mL injection 40 mg, 40 mg, Intravenous, Q12H, Dezii, Alexandra, DO, 40 mg at 03/22/24 2334  metoprolol  succinate (TOPROL -XL) 24 hr tablet 50 mg, 50 mg, Oral, Daily, Cleatus Hoof V, MD, 50 mg at 03/23/24 9175   ondansetron  (ZOFRAN ) tablet 4 mg, 4 mg, Oral, Q6H PRN **OR** ondansetron  (ZOFRAN ) injection 4 mg, 4 mg, Intravenous, Q6H PRN, Cleatus Hoof GAILS, MD   oxyCODONE -acetaminophen  (PERCOCET/ROXICET) 5-325 MG per tablet 1 tablet, 1 tablet, Oral, Q6H PRN, Cleatus Hoof GAILS, MD, 1 tablet at 03/23/24 9171   potassium chloride  (KLOR-CON  M) CR tablet 10 mEq, 10 mEq, Oral, QODAY, Cleatus Hoof GAILS, MD, 10 mEq at 03/22/24 9146   pregabalin  (LYRICA ) capsule 75 mg, 75 mg, Oral, BID, Awanda City, MD, 75 mg at 03/23/24 9175   tranexamic acid  (CYKLOKAPRON ) 1000 MG/10ML nebulizer solution 500 mg, 500 mg, Nebulization, Q8H, Derrica Sieg, MD, 500 mg at 03/23/24 0512   vitamin B-12 (CYANOCOBALAMIN ) tablet 100 mcg, 100 mcg, Oral, Daily, Dezii, Alexandra, DO, 100 mcg at 03/23/24 0825    ALLERGIES   Iodinated contrast media, Trelegy ellipta  [fluticasone -umeclidin-vilant], and Bactoshield chg [chlorhexidine  gluconate]     REVIEW OF SYSTEMS    Review of Systems:  Gen:  Denies  fever, sweats, chills weigh loss  HEENT: Denies blurred vision, double vision, ear pain, eye pain, hearing loss, nose bleeds, sore throat Cardiac:  No dizziness, chest pain or heaviness, chest tightness,edema Resp:   reports dyspnea chronically  Gi: Denies swallowing difficulty, stomach pain, nausea or vomiting, diarrhea, constipation, bowel incontinence Gu:  Denies bladder incontinence, burning urine Ext:   Denies Joint pain, stiffness or swelling Skin: Denies  skin rash, easy bruising or bleeding or hives Endoc:  Denies polyuria, polydipsia ,  polyphagia or weight change Psych:   Denies depression, insomnia or hallucinations   Other:  All other systems negative   VS: BP 136/84 (BP Location: Right Arm)   Pulse 74   Temp 97.8 F (36.6 C)   Resp 14   Ht 4' 11 (1.499 m)   Wt 55 kg   SpO2 100%   BMI 24.50 kg/m      PHYSICAL EXAM    GENERAL:NAD, no fevers, chills, no weakness no fatigue HEAD: Normocephalic, atraumatic.  EYES: Pupils equal, round, reactive to light. Extraocular muscles intact. No scleral icterus.  MOUTH: Moist mucosal membrane. Dentition intact. No abscess noted.  EAR, NOSE, THROAT: Clear without exudates. No external lesions.  NECK: Supple. No thyromegaly. No nodules. No JVD.  PULMONARY: decreased breath sounds with mild rhonchi worse at bases bilaterally.  CARDIOVASCULAR: S1 and S2. Regular rate and rhythm. No murmurs, rubs, or gallops. No edema. Pedal pulses 2+ bilaterally.  GASTROINTESTINAL: Soft, nontender, nondistended. No masses. Positive bowel sounds. No hepatosplenomegaly.  MUSCULOSKELETAL: No swelling, clubbing, or edema. Range of motion full in all extremities.  NEUROLOGIC: Cranial nerves II through XII are intact. No gross focal neurological deficits. Sensation intact. Reflexes intact.  SKIN: No ulceration, lesions, rashes, or cyanosis. Skin warm and dry. Turgor intact.  PSYCHIATRIC: Mood, affect within normal limits. The patient is awake, alert and oriented x 3. Insight, judgment intact.       IMAGING   Narrative & Impression  EXAM: CTA of the Chest with contrast for PE 03/23/2024 07:47:00 AM   TECHNIQUE: CTA of the chest was performed after the administration of intravenous contrast. Multiplanar reformatted images are provided for review. MIP images are provided for review. Automated exposure control, iterative reconstruction, and/or weight based adjustment of the mA/kV was utilized to reduce the radiation dose to as low as reasonably achievable.   COMPARISON: CT pulmonary  angiogram dated 01/28/2024.   CLINICAL HISTORY: Dyspnea, chronic, chest wall or pleura disease suspected; Prior lung cancer, recurrent pneumothorax, known COPD, has been treated for pneumonia during this admission. Ongoing hemoptysis and dyspnea. Concern for malignancy vs PE. Allergic patient received 13-hour allergy prep; Hospital course: JANEENE SAND is a 68 year old female with COPD on 4 L nasal cannula at baseline, right lung cancer status post radiation therapy, recurrent right pneumothorax x 2 most recently August 2024, history of heart failure with preserved EF 50%, hypertension, diabetes, PAD, was admitted with acute respiratory failure requiring BiPAP. On EMS arrival to the home she was saturating in the 50s on room air and endorsed recent fever and cough. She was admitted for treatment of community-acquired pneumonia.   FINDINGS:   PULMONARY ARTERIES: Pulmonary arteries are adequately opacified for evaluation. No pulmonary embolism.   MEDIASTINUM: The heart is borderline size. A leadless cardiac pacemaker is noted within the right ventricle.   LYMPH NODES: No mediastinal, hilar or axillary lymphadenopathy.   LUNGS AND PLEURA: There are patchy and coarse areas of opacification/consolidation within the right lower lobe. There is moderate central lobular emphysema. There is mild right-sided pleural effusion/pleural thickening.   UPPER ABDOMEN: Limited images of the upper abdomen are unremarkable.   SOFT TISSUES AND BONES: No acute bone or soft tissue abnormality.   IMPRESSION: 1. No evidence of pulmonary embolism. 2. Patchy and coarse areas of opacification/consolidation within the right lower lobe, possibly related to recent pneumonia. 3. Mild right-sided pleural effusion/pleural thickening. 4. Moderate central lobular emphysema.   Electronically signed by: evalene coho 03/23/2024 08:14 AM EDT RP Workstation: [id number]      ASSESSMENT/PLAN   Non  massive hemoptysis    - patient may have PE - there is CTA in progress    - additional etiology may include cancer progression, necrotizing pneumonia, medication related hemoptysis, CHF related hemoptysis    - CRP    - RVP and COVID testing    - procalcitonin trend    - respiratory cultures    - nebulized txA q8h    - continue Rocephin  zithromax  for CAP coverage    - dc hypertonic saline as it may potentiate hemoptysis   Right pleural effusion      With associated compressive atelectasis    - Currently not big enough for thoracentesis - will attempt to diurese and consider IR consult -continue lasix  20mg  at dc   Acute exacerbation of COPD    - continue current care path    - noted patient on nebulizer therapy     - continue IS    - continue pred with 50mg  po and daily taper by 5mg  with zithromax  at 250mg  po daily x 4 days more    Acute on chronic hypoxemic respiratory failure     - wean O2 to goal >88%     - chest physiotherapy and PT           Thank you for allowing me to participate in the care of this patient.   Patient/Family are satisfied with care plan and all questions have been answered.    Provider disclosure: Patient with at least one acute or chronic illness or injury that poses a threat to life or bodily function and is being managed actively during this encounter.  All of the below services have been performed independently by signing provider:  review of prior documentation from internal and or external health records.  Review of previous and current lab results.  Interview and comprehensive assessment during patient visit today. Review of current and previous chest radiographs/CT scans. Discussion of management and test interpretation with health care team and patient/family.   This document was prepared using Dragon voice recognition software and may include unintentional dictation errors.     Kyley Laurel, M.D.  Division of Pulmonary & Critical Care  Medicine

## 2024-03-24 ENCOUNTER — Other Ambulatory Visit: Payer: Self-pay

## 2024-03-24 DIAGNOSIS — J441 Chronic obstructive pulmonary disease with (acute) exacerbation: Secondary | ICD-10-CM | POA: Diagnosis not present

## 2024-03-24 LAB — BASIC METABOLIC PANEL WITH GFR
Anion gap: 8 (ref 5–15)
BUN: 26 mg/dL — ABNORMAL HIGH (ref 8–23)
CO2: 36 mmol/L — ABNORMAL HIGH (ref 22–32)
Calcium: 9 mg/dL (ref 8.9–10.3)
Chloride: 99 mmol/L (ref 98–111)
Creatinine, Ser: 0.56 mg/dL (ref 0.44–1.00)
GFR, Estimated: >60 mL/min (ref >60)
Glucose, Bld: 162 mg/dL — ABNORMAL HIGH (ref 70–99)
Potassium: 3.7 mmol/L (ref 3.5–5.1)
Sodium: 143 mmol/L (ref 135–145)

## 2024-03-24 LAB — CBC
HCT: 32.1 % — ABNORMAL LOW (ref 36.0–46.0)
Hemoglobin: 9.7 g/dL — ABNORMAL LOW (ref 12.0–15.0)
MCH: 28.2 pg (ref 26.0–34.0)
MCHC: 30.2 g/dL (ref 30.0–36.0)
MCV: 93.3 fL (ref 80.0–100.0)
Platelets: 262 K/uL (ref 150–400)
RBC: 3.44 MIL/uL — ABNORMAL LOW (ref 3.87–5.11)
RDW: 14.2 % (ref 11.5–15.5)
WBC: 9.6 K/uL (ref 4.0–10.5)
nRBC: 0 % (ref 0.0–0.2)

## 2024-03-24 LAB — GLUCOSE, CAPILLARY
Glucose-Capillary: 128 mg/dL — ABNORMAL HIGH (ref 70–99)
Glucose-Capillary: 220 mg/dL — ABNORMAL HIGH (ref 70–99)

## 2024-03-24 LAB — MAGNESIUM: Magnesium: 2.1 mg/dL (ref 1.7–2.4)

## 2024-03-24 MED ORDER — PREDNISONE 10 MG PO TABS
ORAL_TABLET | ORAL | 0 refills | Status: AC
  Filled 2024-03-24: qty 6, 4d supply, fill #0

## 2024-03-24 MED ORDER — SODIUM CHLORIDE 0.9% FLUSH: 10.0000 mL | INTRAVENOUS | Status: DC | PRN

## 2024-03-24 MED ORDER — LOSARTAN POTASSIUM 25 MG PO TABS
12.5000 mg | ORAL_TABLET | Freq: Every day | ORAL | 0 refills | Status: DC
  Filled 2024-03-24: qty 30, 60d supply, fill #0

## 2024-03-24 NOTE — Inpatient Diabetes Management (Signed)
 Inpatient Diabetes Program Recommendations  AACE/ADA: New Consensus Statement on Inpatient Glycemic Control   Target Ranges:  Prepandial:   less than 140 mg/dL      Peak postprandial:   less than 180 mg/dL (1-2 hours)      Critically ill patients:  140 - 180 mg/dL    Latest Reference Range & Units 03/24/24 04:02  Glucose 70 - 99 mg/dL 837 (H)    Latest Reference Range & Units 03/23/24 08:10 03/23/24 10:19 03/23/24 12:27 03/23/24 13:41 03/23/24 16:27 03/23/24 21:16  Glucose-Capillary 70 - 99 mg/dL 735 (H)  Novolog  11 units   Semglee  5 units 276 (H)   Novolog  11 units 319 (H)  Novolog  15 units 70   Review of Glycemic Control  Diabetes history: DM2 Outpatient Diabetes medications: Toujeo  8-10 units daily, Metformin  XR 1000 mg QAM Current orders for Inpatient glycemic control: Semglee  5 units daily, Novolog  0-20 units TID with meals, Novolog  0-5 units at bedtime; Prednisone  50 mg QAM   Inpatient Diabetes Program Recommendations:     Insulin : Noted Solumedrol was discontinued and Prednisone  continued.  If steroids continued, consider ordering Novolog  3 units TID with meals for meal coverage if patient eats at least 50% of meals.   Thanks, Earnie Gainer, RN, MSN, CDCES Diabetes Coordinator Inpatient Diabetes Program [phone] (Team Pager from 8am to 5pm)

## 2024-03-24 NOTE — Discharge Summary (Signed)
 DISCHARGE SUMMARY    Angela Jensen FMW:[id number] DOB: 09-17-1955 DOA: 03/19/2024  PCP: Liana Fish, NP  Admit date: 03/19/2024 Discharge date: 03/24/2024   Recommendations for Outpatient Follow-up:  Follow up with PCP in 1-2 weeks to review chronic medication management and diabetes care Outpatient follow-up with pulmonology as scheduled  Hospital Course: Angela Jensen is a 68 year old female with COPD on 4 L nasal cannula at baseline, right lung cancer status post radiation therapy, recurrent right pneumothorax x 2 most recently August 2024, history of heart failure with preserved EF 50%, hypertension, diabetes, PAD, was admitted with acute respiratory failure requiring BiPAP.  On EMS arrival to the home she was saturating in the 50s on room air and endorsed recent fever and cough.  She was admitted for treatment of community-acquired pneumonia and COPD exacerbation.  She received antibiotic therapy and steroids.  She did have witnessed hemoptysis and pulmonology was consulted.  CTA revealed right-sided pleural effusion and thickening with moderate centrilobular emphysema, no evidence of PE or pulmonary masses.  She received nebulized TXA with resolution in her hemoptysis.  By 8/8 patient reported feeling back to baseline was on her home O2 3-4L.  Home health was ordered and arranged prior to discharge.   Acute on chronic respiratory failure with hypoxia secondary to community-acquired pneumonia Hemoptysis - Chronically on 4 L O2 at baseline - CXR on arrival with right basilar airspace disease - Status post 5 days ceftriaxone /azithromycin .  Patient chronically takes Bactrim  thrice weekly per pulmonology for pneumonia prevention. - Resume home dose inhalers at discharge - With hemoptysis, now status post nebulized TXA hemoptysis has resolved. - CT this admission:Right-sided pleural effusion and thickening, moderate centrilobular emphysema, patchy and coarse areas of opacification and  consolidation within right lower lobe. (Received pretreatment with steroid and Benadryl  given history of contrast allergy.  No issues reported.) - Has been seen by pulmonology, is established outpatient.   COPD, in exacerbation - Status post IV steroids.  On p.o. now.  Continues to improve - At baseline O2 requirement - Continue nebulizers as scheduled and as needed. - Outpatient 4-day steroid taper followed by home dose Decadron    AKI - Baseline creatinine 0.68, creatinine 1.09 on arrival - Has now improved to baseline status with IV fluids - Continue oral hydration   Chronic diastolic CHF - Last echo 01/2024: EF 60 to 65%, grade 1 diastolic dysfunction - Effusion seen on CT. S/p IV Lasix  while admitted.  P.o. at DC.   History of lung cancer, right lower lobe History of recurrent right pneumothorax - No evidence of pneumothorax on this admission - Follows outpatient with oncology   Hypertension - Continue home meds, monitor closely.  Titrate as needed   Type 2 diabetes without complication Hyperglycemia - Acute worsening secondary to steroid therapy, which is now being tapered - Continue home meds at discharge   PAD - Continue aspirin  and Plavix    Anxiety Depression - Continue home dose Lexapro  and Xanax  Discharge Instructions  Discharge Instructions     Call MD for:  difficulty breathing, headache or visual disturbances   Complete by: As directed    Call MD for:  persistant dizziness or light-headedness   Complete by: As directed    Call MD for:  persistant nausea and vomiting   Complete by: As directed    Call MD for:  severe uncontrolled pain   Complete by: As directed    Call MD for:  temperature >100.4   Complete by: As directed  Diet general   Complete by: As directed    Discharge instructions   Complete by: As directed    Follow up with your primary care physician to discuss the medication changes during this admission.  Follow with pulmonology as  scheduled.   Increase activity slowly   Complete by: As directed       Allergies as of 03/24/2024       Reactions   Iodinated Contrast Media Itching   Trelegy Ellipta  [fluticasone -umeclidin-vilant]    Had breathing issues   Bactoshield Chg [chlorhexidine  Gluconate] Itching, Other (See Comments)   Dry. Flaking, peeling skin        Medication List     STOP taking these medications    famotidine  20 MG tablet Commonly known as: PEPCID        TAKE these medications    Accu-Chek Guide Test test strip Generic drug: glucose blood USE TO TEST BLOOD SUGAR 5 TIMES DAILY   Accu-Chek Guide w/Device Kit Use as directed Dx e11.65   Accu-Chek Softclix Lancets lancets Use 1 lancet 3 times daily to check glucose for diabetes   albuterol  (5 MG/ML) 0.5% nebulizer solution Commonly known as: PROVENTIL  Take 2.5 mg by nebulization every 6 (six) hours as needed for wheezing or shortness of breath.   albuterol  108 (90 Base) MCG/ACT inhaler Commonly known as: VENTOLIN  HFA INHALE 2 PUFFS BY MOUTH EVERY 6 HOURS AS NEEDED FOR WHEEZING OR SHORTNESS OF BREATH   ALPRAZolam  0.5 MG tablet Commonly known as: XANAX  Take 1 tablet (0.5 mg total) by mouth 2 (two) times daily as needed for anxiety.   aspirin  EC 81 MG tablet Take 1 tablet (81 mg total) by mouth daily. Swallow whole.   atorvastatin  10 MG tablet Commonly known as: Lipitor Take 1 tablet (10 mg total) by mouth daily.   calcium  carbonate 1500 (600 Ca) MG Tabs tablet Commonly known as: OSCAL Take 600 mg of elemental calcium  by mouth 2 (two) times daily with a meal.   clopidogrel  75 MG tablet Commonly known as: PLAVIX  Take 1 tablet (75 mg total) by mouth daily.   cyclobenzaprine  10 MG tablet Commonly known as: FLEXERIL  Take 1 tablet (10 mg total) by mouth at bedtime. Take one tab po qhs for back spasm prn only What changed:  when to take this reasons to take this   dexamethasone  1 MG tablet Commonly known as: DECADRON  Take  1 mg by mouth.   Dulera  200-5 MCG/ACT Aero Generic drug: mometasone -formoterol  Inhale 2 puffs by mouth twice daily   escitalopram  10 MG tablet Commonly known as: LEXAPRO  Take 1 tablet (10 mg total) by mouth daily.   ferrous sulfate  325 (65 FE) MG tablet Take 325 mg by mouth 2 (two) times daily with a meal.   furosemide  20 MG tablet Commonly known as: LASIX  Take 1 tablet (20 mg total) by mouth daily as needed.   insulin  glargine (1 Unit Dial ) 300 UNIT/ML Solostar Pen Commonly known as: TOUJEO  Inject 10 Units into the skin daily.   ipratropium-albuterol  0.5-2.5 (3) MG/3ML Soln Commonly known as: DUONEB Take 3 mLs by nebulization every 6 (six) hours as needed.   losartan  25 MG tablet Commonly known as: COZAAR  Take 0.5 tablets (12.5 mg total) by mouth daily. Start taking on: March 25, 2024 What changed: how much to take   metFORMIN  500 MG 24 hr tablet Commonly known as: GLUCOPHAGE -XR TAKE 2 TABLETS BY MOUTH ONCE DAILY WITH BREAKFAST   metoprolol  succinate 50 MG 24 hr tablet Commonly known  as: TOPROL -XL Take 1 tablet (50 mg total) by mouth daily. Take with or immediately following a meal.   oxyCODONE -acetaminophen  5-325 MG tablet Commonly known as: PERCOCET/ROXICET Take 1 tablet by mouth every 6 (six) hours as needed for severe pain (pain score 7-10).   OXYGEN  Inhale 4 L into the lungs. PT USES [facility] FOR OXYGEN    potassium chloride  10 MEQ tablet Commonly known as: KLOR-CON  TAKE 1 TABLET BY MOUTH EVERY OTHER DAY   predniSONE  10 MG tablet Commonly known as: DELTASONE  Take 2 tablets (20 mg total) by mouth daily with breakfast for 2 days, THEN 1 tablet (10 mg total) daily with breakfast for 2 days. Start taking on: March 24, 2024   pregabalin  75 MG capsule Commonly known as: Lyrica  Take 1 capsule (75 mg total) by mouth 2 (two) times daily.   ReliOn Pen Needles 31G X 6 MM Misc Generic drug: Insulin  Pen Needle USE 1 PEN NEEDLE WITH INSULIN  PEN ONCE DAILY FOR  DIABETES   Robafen DM 20-200 MG/20ML Liqd Generic drug: Dextromethorphan -guaiFENesin  Take 30 mLs by mouth every 4 (four) hours as needed.   Spiriva  HandiHaler 18 MCG inhalation capsule Generic drug: tiotropium INHALE THE CONTENTS OF 1 CAPSULES BY MOUTH ONCE DAILY - DO NOT SWALLOW CAPSULES   sulfamethoxazole -trimethoprim  400-80 MG tablet Commonly known as: BACTRIM  Take by mouth.   vitamin B-12 100 MCG tablet Commonly known as: CYANOCOBALAMIN  Take 100 mcg by mouth daily.   VITAMIN D  (CHOLECALCIFEROL ) PO Take 1 tablet by mouth in the morning.        Allergies  Allergen Reactions   Iodinated Contrast Media Itching   Trelegy Ellipta  [Fluticasone -Umeclidin-Vilant]     Had breathing issues   Bactoshield Chg [Chlorhexidine  Gluconate] Itching and Other (See Comments)    Dry. Flaking, peeling skin    Consultations: Treatment Team:  Parris Manna, MD   Procedures/Studies: CT Angio Chest Pulmonary Embolism (PE) W or WO Contrast Result Date: 03/23/2024 EXAM: CTA of the Chest with contrast for PE 03/23/2024 07:47:00 AM TECHNIQUE: CTA of the chest was performed after the administration of intravenous contrast. Multiplanar reformatted images are provided for review. MIP images are provided for review. Automated exposure control, iterative reconstruction, and/or weight based adjustment of the mA/kV was utilized to reduce the radiation dose to as low as reasonably achievable. COMPARISON: CT pulmonary angiogram dated 01/28/2024. CLINICAL HISTORY: Dyspnea, chronic, chest wall or pleura disease suspected; Prior lung cancer, recurrent pneumothorax, known COPD, has been treated for pneumonia during this admission. Ongoing hemoptysis and dyspnea. Concern for malignancy vs PE. Allergic patient received 13-hour allergy prep; Hospital course: Angela Jensen is a 68 year old female with COPD on 4 L nasal cannula at baseline, right lung cancer status post radiation therapy, recurrent right pneumothorax x  2 most recently August 2024, history of heart failure with preserved EF 50%, hypertension, diabetes, PAD, was admitted with acute respiratory failure requiring BiPAP. On EMS arrival to the home she was saturating in the 50s on room air and endorsed recent fever and cough. She was admitted for treatment of community-acquired pneumonia. FINDINGS: PULMONARY ARTERIES: Pulmonary arteries are adequately opacified for evaluation. No pulmonary embolism. MEDIASTINUM: The heart is borderline size. A leadless cardiac pacemaker is noted within the right ventricle. LYMPH NODES: No mediastinal, hilar or axillary lymphadenopathy. LUNGS AND PLEURA: There are patchy and coarse areas of opacification/consolidation within the right lower lobe. There is moderate central lobular emphysema. There is mild right-sided pleural effusion/pleural thickening. UPPER ABDOMEN: Limited images of the upper abdomen  are unremarkable. SOFT TISSUES AND BONES: No acute bone or soft tissue abnormality. IMPRESSION: 1. No evidence of pulmonary embolism. 2. Patchy and coarse areas of opacification/consolidation within the right lower lobe, possibly related to recent pneumonia. 3. Mild right-sided pleural effusion/pleural thickening. 4. Moderate central lobular emphysema. Electronically signed by: evalene coho 03/23/2024 08:14 AM EDT RP Workstation: [id number]   DG Chest Port 1 View Result Date: 03/19/2024 CLINICAL DATA:  Respiratory distress peer EXAM: PORTABLE CHEST 1 VIEW COMPARISON:  January 29, 2024 FINDINGS: The heart size and mediastinal contours are within normal limits. There is evidence of emphysematous lung disease. Mild left basilar and mild to moderate severity right basilar airspace disease is noted. There is a small right pleural effusion. No pneumothorax is identified. The visualized skeletal structures are unremarkable. IMPRESSION: 1. Mild left basilar and mild to moderate severity right basilar airspace disease. 2. Small right pleural  effusion. Electronically Signed   By: Suzen Dials M.D.   On: 03/19/2024 23:16      Discharge Exam: Vitals:   03/24/24 0340 03/24/24 0821  BP: (!) 166/92 137/85  Pulse: 76 79  Resp:  18  Temp:  99.7 F (37.6 C)  SpO2:  97%   Vitals:   03/23/24 2105 03/24/24 0338 03/24/24 0340 03/24/24 0821  BP:  (!) 155/110 (!) 166/92 137/85  Pulse:  78 76 79  Resp:  18  18  Temp:  98.2 F (36.8 C)  99.7 F (37.6 C)  TempSrc:  Oral  Oral  SpO2: 98% 100%  97%  Weight:      Height:        Constitutional:  Normal appearance. Non toxic-appearing.  HENT: Head Normocephalic and atraumatic.  Mucous membranes are moist.  Eyes:  Extraocular intact. Conjunctivae normal.  Cardiovascular: Rate and Rhythm: Normal rate and regular rhythm.  Pulmonary: Non labored, symmetric rise of chest wall.  Nasal cannula in place, no wheezing. Skin: warm and dry. not jaundiced.  Neurological: No focal deficit present. alert. Oriented.  Psychiatric: Mood and Affect congruent.    The results of significant diagnostics from this hospitalization (including imaging, microbiology, ancillary and laboratory) are listed below for reference.     Microbiology: Recent Results (from the past 240 hours)  Resp panel by RT-PCR (RSV, Flu A&B, Covid) Anterior Nasal Swab     Status: None   Collection Time: 03/19/24 11:40 PM   Specimen: Anterior Nasal Swab  Result Value Ref Range Status   SARS Coronavirus 2 by RT PCR NEGATIVE NEGATIVE Final    Comment: (NOTE) SARS-CoV-2 target nucleic acids are NOT DETECTED.  The SARS-CoV-2 RNA is generally detectable in upper respiratory specimens during the acute phase of infection. The lowest concentration of SARS-CoV-2 viral copies this assay can detect is 138 copies/mL. A negative result does not preclude SARS-Cov-2 infection and should not be used as the sole basis for treatment or other patient management decisions. A negative result may occur with  improper specimen  collection/handling, submission of specimen other than nasopharyngeal swab, presence of viral mutation(s) within the areas targeted by this assay, and inadequate number of viral copies(<138 copies/mL). A negative result must be combined with clinical observations, patient history, and epidemiological information. The expected result is Negative.  Fact Sheet for Patients:  BloggerCourse.com  Fact Sheet for Healthcare Providers:  SeriousBroker.it  This test is no t yet approved or cleared by the [place]  FDA and  has been authorized for detection and/or diagnosis of SARS-CoV-2 by FDA under an Emergency Use  Authorization (EUA). This EUA will remain  in effect (meaning this test can be used) for the duration of the COVID-19 declaration under Section 564(b)(1) of the Act, 21 U.S.C.section 360bbb-3(b)(1), unless the authorization is terminated  or revoked sooner.       Influenza A by PCR NEGATIVE NEGATIVE Final   Influenza B by PCR NEGATIVE NEGATIVE Final    Comment: (NOTE) The Xpert Xpress SARS-CoV-2/FLU/RSV plus assay is intended as an aid in the diagnosis of influenza from Nasopharyngeal swab specimens and should not be used as a sole basis for treatment. Nasal washings and aspirates are unacceptable for Xpert Xpress SARS-CoV-2/FLU/RSV testing.  Fact Sheet for Patients: BloggerCourse.com  Fact Sheet for Healthcare Providers: SeriousBroker.it  This test is not yet approved or cleared by the [place]  FDA and has been authorized for detection and/or diagnosis of SARS-CoV-2 by FDA under an Emergency Use Authorization (EUA). This EUA will remain in effect (meaning this test can be used) for the duration of the COVID-19 declaration under Section 564(b)(1) of the Act, 21 U.S.C. section 360bbb-3(b)(1), unless the authorization is terminated or revoked.     Resp Syncytial  Virus by PCR NEGATIVE NEGATIVE Final    Comment: (NOTE) Fact Sheet for Patients: BloggerCourse.com  Fact Sheet for Healthcare Providers: SeriousBroker.it  This test is not yet approved or cleared by the [place]  FDA and has been authorized for detection and/or diagnosis of SARS-CoV-2 by FDA under an Emergency Use Authorization (EUA). This EUA will remain in effect (meaning this test can be used) for the duration of the COVID-19 declaration under Section 564(b)(1) of the Act, 21 U.S.C. section 360bbb-3(b)(1), unless the authorization is terminated or revoked.  Performed at [facility], [address] [place], [place] [address]   Respiratory (~20 pathogens) panel by PCR     Status: None   Collection Time: 03/22/24  3:16 PM   Specimen: Nasopharyngeal Swab; Respiratory  Result Value Ref Range Status   Adenovirus NOT DETECTED NOT DETECTED Final   Coronavirus 229E NOT DETECTED NOT DETECTED Final    Comment: (NOTE) The Coronavirus on the Respiratory Panel, DOES NOT test for the novel  Coronavirus (2019 nCoV)    Coronavirus HKU1 NOT DETECTED NOT DETECTED Final   Coronavirus NL63 NOT DETECTED NOT DETECTED Final   Coronavirus OC43 NOT DETECTED NOT DETECTED Final   Metapneumovirus NOT DETECTED NOT DETECTED Final   Rhinovirus / Enterovirus NOT DETECTED NOT DETECTED Final   Influenza A NOT DETECTED NOT DETECTED Final   Influenza B NOT DETECTED NOT DETECTED Final   Parainfluenza Virus 1 NOT DETECTED NOT DETECTED Final   Parainfluenza Virus 2 NOT DETECTED NOT DETECTED Final   Parainfluenza Virus 3 NOT DETECTED NOT DETECTED Final   Parainfluenza Virus 4 NOT DETECTED NOT DETECTED Final   Respiratory Syncytial Virus NOT DETECTED NOT DETECTED Final   Bordetella pertussis NOT DETECTED NOT DETECTED Final   Bordetella Parapertussis NOT DETECTED NOT DETECTED Final   Chlamydophila pneumoniae NOT DETECTED NOT DETECTED Final    Mycoplasma pneumoniae NOT DETECTED NOT DETECTED Final    Comment: Performed at [facility], [address], [place], [place] [address]  MRSA Next Gen by PCR, Nasal     Status: None   Collection Time: 03/22/24  3:16 PM   Specimen: Nasal Mucosa; Nasal Swab  Result Value Ref Range Status   MRSA by PCR Next Gen NOT DETECTED NOT DETECTED Final    Comment: (NOTE) The GeneXpert MRSA Assay (FDA approved for NASAL  specimens only), is one component of a comprehensive MRSA colonization surveillance program. It is not intended to diagnose MRSA infection nor to guide or monitor treatment for MRSA infections. Test performance is not FDA approved in patients less than 57 years old. Performed at [facility], [address] [place], [place] [address]   SARS Coronavirus 2 by RT PCR (hospital order, performed in [facility] hospital lab) *cepheid single result test* Anterior Nasal Swab     Status: None   Collection Time: 03/22/24  3:16 PM   Specimen: Anterior Nasal Swab  Result Value Ref Range Status   SARS Coronavirus 2 by RT PCR NEGATIVE NEGATIVE Final    Comment: (NOTE) SARS-CoV-2 target nucleic acids are NOT DETECTED.  The SARS-CoV-2 RNA is generally detectable in upper and lower respiratory specimens during the acute phase of infection. The lowest concentration of SARS-CoV-2 viral copies this assay can detect is 250 copies / mL. A negative result does not preclude SARS-CoV-2 infection and should not be used as the sole basis for treatment or other patient management decisions.  A negative result may occur with improper specimen collection / handling, submission of specimen other than nasopharyngeal swab, presence of viral mutation(s) within the areas targeted by this assay, and inadequate number of viral copies (<250 copies / mL). A negative result must be combined with clinical observations, patient history, and epidemiological information.  Fact Sheet for Patients:    RoadLapTop.co.za  Fact Sheet for Healthcare Providers: http://kim-miller.com/  This test is not yet approved or  cleared by the [place]  FDA and has been authorized for detection and/or diagnosis of SARS-CoV-2 by FDA under an Emergency Use Authorization (EUA).  This EUA will remain in effect (meaning this test can be used) for the duration of the COVID-19 declaration under Section 564(b)(1) of the Act, 21 U.S.C. section 360bbb-3(b)(1), unless the authorization is terminated or revoked sooner.  Performed at [facility], [address] [place], [place] [address]   Expectorated Sputum Assessment w Gram Stain, Rflx to Resp Cult     Status: None   Collection Time: 03/23/24  8:30 AM   Specimen: Expectorated Sputum  Result Value Ref Range Status   Specimen Description   Final    EXPECTORATED SPUTUM Performed at [facility], [address], [place], [place] [address]    Special Requests   Final    NONE Performed at [facility], [address] [place], [place] [address]    Sputum evaluation   Final    THIS SPECIMEN IS ACCEPTABLE FOR SPUTUM CULTURE Performed at [facility], [address], [place], [place] [address]    Report Status 03/23/2024 FINAL  Final  Culture, Respiratory w Gram Stain     Status: None (Preliminary result)   Collection Time: 03/23/24  8:30 AM  Result Value Ref Range Status   Specimen Description EXPECTORATED SPUTUM  Final   Special Requests NONE Reflexed from [id number]  Final   Gram Stain   Final    FEW WBC PRESENT, PREDOMINANTLY PMN FEW GRAM POSITIVE COCCI    Culture   Final    TOO YOUNG TO READ Performed at [facility], [address], [place], [place] [address]    Report Status PENDING  Incomplete     Labs: BNP (last 3 results) Recent Labs    06/14/23 2346 01/27/24 2005  BNP 126.9* 47.8   Basic Metabolic Panel: Recent Labs  Lab 03/19/24 2242  03/20/24  9545 03/22/24 0436 03/24/24 0402  NA 140 143 142 143  K 4.2 4.6 3.8 3.7  CL 102 104 105 99  CO2 26 28 32 36*  GLUCOSE 320* 227* 83 162*  BUN 16 16 16  26*  CREATININE 1.09* 0.74 0.57 0.56  CALCIUM  8.4* 8.1* 8.4* 9.0  MG  --   --  2.1 2.1   Liver Function Tests: Recent Labs  Lab 03/19/24 2242  AST 129*  ALT 64*  ALKPHOS 116  BILITOT 0.4  PROT 7.3  ALBUMIN 3.8   No results for input(s): LIPASE, AMYLASE in the last 168 hours. No results for input(s): AMMONIA in the last 168 hours. CBC: Recent Labs  Lab 03/19/24 2242 03/20/24 0454 03/22/24 0436 03/24/24 0402  WBC 9.5 9.0 7.0 9.6  NEUTROABS 5.9  --   --   --   HGB 11.2* 10.3* 9.6* 9.7*  HCT 38.2 34.2* 32.0* 32.1*  MCV 97.0 95.5 94.7 93.3  PLT 270 206 203 262   Cardiac Enzymes: No results for input(s): CKTOTAL, CKMB, CKMBINDEX, TROPONINI in the last 168 hours. BNP: Invalid input(s): POCBNP CBG: Recent Labs  Lab 03/23/24 1227 03/23/24 1627 03/23/24 2116 03/24/24 0823 03/24/24 1142  GLUCAP 276* 319* 70 128* 220*   D-Dimer No results for input(s): DDIMER in the last 72 hours. Hgb A1c No results for input(s): HGBA1C in the last 72 hours. Lipid Profile No results for input(s): CHOL, HDL, LDLCALC, TRIG, CHOLHDL, LDLDIRECT in the last 72 hours. Thyroid  function studies No results for input(s): TSH, T4TOTAL, T3FREE, THYROIDAB in the last 72 hours.  Invalid input(s): FREET3 Anemia work up No results for input(s): VITAMINB12, FOLATE, FERRITIN, TIBC, IRON , RETICCTPCT in the last 72 hours. Urinalysis    Component Value Date/Time   COLORURINE STRAW (A) 04/17/2023 0930   APPEARANCEUR CLEAR (A) 04/17/2023 0930   APPEARANCEUR Clear 08/06/2022 1541   LABSPEC 1.014 04/17/2023 0930   PHURINE 6.0 04/17/2023 0930   GLUCOSEU NEGATIVE 04/17/2023 0930   HGBUR MODERATE (A) 04/17/2023 0930   BILIRUBINUR NEGATIVE 04/17/2023 0930   BILIRUBINUR Negative  08/06/2022 1541   KETONESUR NEGATIVE 04/17/2023 0930   PROTEINUR NEGATIVE 04/17/2023 0930   UROBILINOGEN 0.2 04/22/2021 0959   NITRITE NEGATIVE 04/17/2023 0930   LEUKOCYTESUR NEGATIVE 04/17/2023 0930   Sepsis Labs Recent Labs  Lab 03/19/24 2242 03/20/24 0454 03/22/24 0436 03/24/24 0402  WBC 9.5 9.0 7.0 9.6   Microbiology Recent Results (from the past 240 hours)  Resp panel by RT-PCR (RSV, Flu A&B, Covid) Anterior Nasal Swab     Status: None   Collection Time: 03/19/24 11:40 PM   Specimen: Anterior Nasal Swab  Result Value Ref Range Status   SARS Coronavirus 2 by RT PCR NEGATIVE NEGATIVE Final    Comment: (NOTE) SARS-CoV-2 target nucleic acids are NOT DETECTED.  The SARS-CoV-2 RNA is generally detectable in upper respiratory specimens during the acute phase of infection. The lowest concentration of SARS-CoV-2 viral copies this assay can detect is 138 copies/mL. A negative result does not preclude SARS-Cov-2 infection and should not be used as the sole basis for treatment or other patient management decisions. A negative result may occur with  improper specimen collection/handling, submission of specimen other than nasopharyngeal swab, presence of viral mutation(s) within the areas targeted by this assay, and inadequate number of viral copies(<138 copies/mL). A negative result must be combined with clinical observations, patient history, and epidemiological information. The expected result is Negative.  Fact Sheet for Patients:  BloggerCourse.com  Fact Sheet for  Healthcare Providers:  SeriousBroker.it  This test is no t yet approved or cleared by the [place]  FDA and  has been authorized for detection and/or diagnosis of SARS-CoV-2 by FDA under an Emergency Use Authorization (EUA). This EUA will remain  in effect (meaning this test can be used) for the duration of the COVID-19 declaration under Section 564(b)(1) of  the Act, 21 U.S.C.section 360bbb-3(b)(1), unless the authorization is terminated  or revoked sooner.       Influenza A by PCR NEGATIVE NEGATIVE Final   Influenza B by PCR NEGATIVE NEGATIVE Final    Comment: (NOTE) The Xpert Xpress SARS-CoV-2/FLU/RSV plus assay is intended as an aid in the diagnosis of influenza from Nasopharyngeal swab specimens and should not be used as a sole basis for treatment. Nasal washings and aspirates are unacceptable for Xpert Xpress SARS-CoV-2/FLU/RSV testing.  Fact Sheet for Patients: BloggerCourse.com  Fact Sheet for Healthcare Providers: SeriousBroker.it  This test is not yet approved or cleared by the [place]  FDA and has been authorized for detection and/or diagnosis of SARS-CoV-2 by FDA under an Emergency Use Authorization (EUA). This EUA will remain in effect (meaning this test can be used) for the duration of the COVID-19 declaration under Section 564(b)(1) of the Act, 21 U.S.C. section 360bbb-3(b)(1), unless the authorization is terminated or revoked.     Resp Syncytial Virus by PCR NEGATIVE NEGATIVE Final    Comment: (NOTE) Fact Sheet for Patients: BloggerCourse.com  Fact Sheet for Healthcare Providers: SeriousBroker.it  This test is not yet approved or cleared by the [place]  FDA and has been authorized for detection and/or diagnosis of SARS-CoV-2 by FDA under an Emergency Use Authorization (EUA). This EUA will remain in effect (meaning this test can be used) for the duration of the COVID-19 declaration under Section 564(b)(1) of the Act, 21 U.S.C. section 360bbb-3(b)(1), unless the authorization is terminated or revoked.  Performed at [facility], [address] [place], [place] [address]   Respiratory (~20 pathogens) panel by PCR     Status: None   Collection Time: 03/22/24  3:16 PM   Specimen:  Nasopharyngeal Swab; Respiratory  Result Value Ref Range Status   Adenovirus NOT DETECTED NOT DETECTED Final   Coronavirus 229E NOT DETECTED NOT DETECTED Final    Comment: (NOTE) The Coronavirus on the Respiratory Panel, DOES NOT test for the novel  Coronavirus (2019 nCoV)    Coronavirus HKU1 NOT DETECTED NOT DETECTED Final   Coronavirus NL63 NOT DETECTED NOT DETECTED Final   Coronavirus OC43 NOT DETECTED NOT DETECTED Final   Metapneumovirus NOT DETECTED NOT DETECTED Final   Rhinovirus / Enterovirus NOT DETECTED NOT DETECTED Final   Influenza A NOT DETECTED NOT DETECTED Final   Influenza B NOT DETECTED NOT DETECTED Final   Parainfluenza Virus 1 NOT DETECTED NOT DETECTED Final   Parainfluenza Virus 2 NOT DETECTED NOT DETECTED Final   Parainfluenza Virus 3 NOT DETECTED NOT DETECTED Final   Parainfluenza Virus 4 NOT DETECTED NOT DETECTED Final   Respiratory Syncytial Virus NOT DETECTED NOT DETECTED Final   Bordetella pertussis NOT DETECTED NOT DETECTED Final   Bordetella Parapertussis NOT DETECTED NOT DETECTED Final   Chlamydophila pneumoniae NOT DETECTED NOT DETECTED Final   Mycoplasma pneumoniae NOT DETECTED NOT DETECTED Final    Comment: Performed at [facility], [address] [place], [place] [address]  MRSA Next Gen by PCR, Nasal     Status: None   Collection Time: 03/22/24  3:16 PM  Specimen: Nasal Mucosa; Nasal Swab  Result Value Ref Range Status   MRSA by PCR Next Gen NOT DETECTED NOT DETECTED Final    Comment: (NOTE) The GeneXpert MRSA Assay (FDA approved for NASAL specimens only), is one component of a comprehensive MRSA colonization surveillance program. It is not intended to diagnose MRSA infection nor to guide or monitor treatment for MRSA infections. Test performance is not FDA approved in patients less than 68 years old. Performed at [facility], [address] [place], [place] [address]   SARS Coronavirus 2 by RT PCR (hospital order, performed  in [facility] hospital lab) *cepheid single result test* Anterior Nasal Swab     Status: None   Collection Time: 03/22/24  3:16 PM   Specimen: Anterior Nasal Swab  Result Value Ref Range Status   SARS Coronavirus 2 by RT PCR NEGATIVE NEGATIVE Final    Comment: (NOTE) SARS-CoV-2 target nucleic acids are NOT DETECTED.  The SARS-CoV-2 RNA is generally detectable in upper and lower respiratory specimens during the acute phase of infection. The lowest concentration of SARS-CoV-2 viral copies this assay can detect is 250 copies / mL. A negative result does not preclude SARS-CoV-2 infection and should not be used as the sole basis for treatment or other patient management decisions.  A negative result may occur with improper specimen collection / handling, submission of specimen other than nasopharyngeal swab, presence of viral mutation(s) within the areas targeted by this assay, and inadequate number of viral copies (<250 copies / mL). A negative result must be combined with clinical observations, patient history, and epidemiological information.  Fact Sheet for Patients:   RoadLapTop.co.za  Fact Sheet for Healthcare Providers: http://kim-miller.com/  This test is not yet approved or  cleared by the [place]  FDA and has been authorized for detection and/or diagnosis of SARS-CoV-2 by FDA under an Emergency Use Authorization (EUA).  This EUA will remain in effect (meaning this test can be used) for the duration of the COVID-19 declaration under Section 564(b)(1) of the Act, 21 U.S.C. section 360bbb-3(b)(1), unless the authorization is terminated or revoked sooner.  Performed at [facility], [address] [place], [place] [address]   Expectorated Sputum Assessment w Gram Stain, Rflx to Resp Cult     Status: None   Collection Time: 03/23/24  8:30 AM   Specimen: Expectorated Sputum  Result Value Ref Range Status   Specimen  Description   Final    EXPECTORATED SPUTUM Performed at [facility], [address], [place], [place] [address]    Special Requests   Final    NONE Performed at [facility], [address] [place], [place] [address]    Sputum evaluation   Final    THIS SPECIMEN IS ACCEPTABLE FOR SPUTUM CULTURE Performed at [facility], [address] [place], [place] [address]    Report Status 03/23/2024 FINAL  Final  Culture, Respiratory w Gram Stain     Status: None (Preliminary result)   Collection Time: 03/23/24  8:30 AM  Result Value Ref Range Status   Specimen Description EXPECTORATED SPUTUM  Final   Special Requests NONE Reflexed from [id number]  Final   Gram Stain   Final    FEW WBC PRESENT, PREDOMINANTLY PMN FEW GRAM POSITIVE COCCI    Culture   Final    TOO YOUNG TO READ Performed at [facility], [address], [place], [place] [address]    Report Status PENDING  Incomplete     Time coordinating discharge: 32 min   SIGNED: Johsua Shevlin, DO [facility] 03/24/2024, 2:19 PM Pager   If 7PM-7AM, please contact night-coverage

## 2024-03-24 NOTE — TOC Transition Note (Signed)
 Transition of Care [facility]) - Discharge Note   Patient Details  Name: CAASI GIGLIA MRN: [id number] Date of Birth: [birth year]  Transition of Care [facility] CM/SW Contact:  Corean ONEIDA Haddock, RN Phone Number: 03/24/2024, 12:45 PM   Clinical Narrative:    Channing with [facility] notified of dc          Patient Goals and CMS Choice            Discharge Placement                       Discharge Plan and Services Additional resources added to the After Visit Summary for                                       Social Drivers of Health (SDOH) Interventions SDOH Screenings   Food Insecurity: No Food Insecurity (03/20/2024)  Housing: Low Risk  (03/20/2024)  Transportation Needs: No Transportation Needs (03/20/2024)  Utilities: Not At Risk (03/20/2024)  Alcohol Screen: Low Risk  (12/11/2021)  Depression (PHQ2-9): Low Risk  (10/13/2023)  Social Connections: Socially Integrated (03/20/2024)  Tobacco Use: Medium Risk (03/20/2024)     Readmission Risk Interventions    03/21/2024    3:56 PM 01/26/2024   10:40 AM  Readmission Risk Prevention Plan  Transportation Screening Complete Complete  PCP or Specialist Appt within 3-5 Days  Complete  HRI or Home Care Consult Complete   Social Work Consult for Recovery Care Planning/Counseling Complete Complete  Palliative Care Screening Not Applicable Not Applicable  Medication Review Oceanographer) Complete Complete

## 2024-03-24 NOTE — Progress Notes (Signed)
 PT Cancellation Note  Patient Details Name: Angela Jensen MRN: [id number] DOB: [birth year]   Cancelled Treatment:    Reason Eval/Treat Not Completed: Other (comment). Chart reviewed prior to entry. Upon entry pt politely declining PT stating she is going home later today. Pt has no mobility concerns or questions prior to d/c.    [name]. Fairly IV, PT, DPT Physical Therapist-   [facility] 03/24/2024, 11:00 AM

## 2024-03-24 NOTE — Progress Notes (Signed)
 PULMONOLOGY         Date: 03/24/2024,   MRN# [id number] Angela Jensen [birth year]     AdmissionWeight: 55 kg                 CurrentWeight: 55 kg  Referring provider: Dr Leesa    CHIEF COMPLAINT:   Non-massive hemoptysis   HISTORY OF PRESENT ILLNESS   68 yo F with hx of chronic anemia, Asthma and COPD overlap with right lung cancer and recent onset of hemoptysis.  She also has background of DM, PAD, HTN, Systolic CHF, recurrent right pneumothorax.  She shares that she was in her usual state of health when all of the sudden she noted cough and non massive hemoptysis which started appx 5 days prior to admission.  She attempted to wait for few days to see if bleeding will subside but it has not and she seeks ER Evaluation for this reason. She is on O2 during my evaluation and continues to cough up blood. She denies having chest pains and denies blood clots. Denies medication noncompliance, she does take asa and plavix . PCCM consultation for acute on chronic hypoxemia with non massive hemoptysis.   03/24/24- Patient seen at bedside she is stable and at baseline now.  Reports no episdoes of hemoptysis and is excited to be able to go home.  I plan to follow up with her in clinic. She cleared for dc home  PAST MEDICAL HISTORY   Past Medical History:  Diagnosis Date   Anemia    Arthritis    Asthma    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/2016   Cancer (HCC) 2012   Right Lung CA   COPD (chronic obstructive pulmonary disease) (HCC)    Depression    Diabetes mellitus without complication (HCC)    Patient takes Janumet   Essential hypertension 05/26/2016   Heart failure (HCC) 2022   Hydropneumothorax 05/03/2020   Hypercholesteremia    Oxygen  dependent    2L at nite    PAD (peripheral artery disease) (HCC) 06/22/2016   Peripheral vascular disease (HCC)    Personal history of radiation therapy    Shortness of breath dyspnea     with exertion    Sialolithiasis    Sleep apnea    Wears dentures    full upper and lower     SURGICAL HISTORY   Past Surgical History:  Procedure Laterality Date   APPLICATION OF CELL SAVER Left 01/21/2024   Procedure: APPLICATION OF CELL SAVER;  Surgeon: Marea Selinda RAMAN, MD;  Location: [facility];  Service: Vascular;  Laterality: Left;   CESAREAN SECTION     x3   COLONOSCOPY WITH PROPOFOL  N/A 06/25/2015   Procedure: COLONOSCOPY WITH PROPOFOL ;  Surgeon: Rogelia Copping, MD;  Location: [facility];  Service: Endoscopy;  Laterality: N/A;   COLONOSCOPY WITH PROPOFOL  N/A 07/26/2020   Procedure: COLONOSCOPY WITH PROPOFOL ;  Surgeon: Copping Rogelia, MD;  Location: [facility];  Service: Endoscopy;  Laterality: N/A;   CYST REMOVAL LEG     and on shoulder    ENDARTERECTOMY FEMORAL Left 01/21/2024   Procedure: LEFT COMMON, SUPERFICIAL FEMORAL AND PROFUNDA FEMORIS ENDARTERECTOMY WITH BOVINE PERICARDIAL PATCH ANGIOPLASTY;  Surgeon: Marea Selinda RAMAN, MD;  Location: [facility];  Service: Vascular;  Laterality: Left;   ESOPHAGOGASTRODUODENOSCOPY (EGD) WITH PROPOFOL  N/A 07/26/2020   Procedure: ESOPHAGOGASTRODUODENOSCOPY (EGD) WITH PROPOFOL ;  Surgeon: Copping Rogelia, MD;  Location: [facility];  Service: Endoscopy;  Laterality: N/A;  Diabetic - oral meds   INSERTION OF ILIAC STENT Left 01/21/2024   Procedure: INSERTION, STENT, ARTERY, ILIAC;  Surgeon: Marea Selinda RAMAN, MD;  Location: [facility];  Service: Vascular;  Laterality: Left;  AND SFA STENTS   LOWER EXTREMITY ANGIOGRAPHY Left 09/29/2018   Procedure: LOWER EXTREMITY ANGIOGRAPHY;  Surgeon: Marea Selinda RAMAN, MD;  Location: [facility];  Service: Cardiovascular;  Laterality: Left;   LOWER EXTREMITY ANGIOGRAPHY Left 07/29/2023   Procedure: Lower Extremity Angiography;  Surgeon: Marea Selinda RAMAN, MD;  Location: [facility];  Service: Cardiovascular;  Laterality: Left;   LOWER EXTREMITY ANGIOGRAPHY Right 12/20/2023   Procedure: Lower Extremity  Angiography;  Surgeon: Marea Selinda RAMAN, MD;  Location: [facility];  Service: Cardiovascular;  Laterality: Right;   LOWER EXTREMITY ANGIOGRAPHY Left 01/17/2024   Procedure: Lower Extremity Angiography;  Surgeon: Marea Selinda RAMAN, MD;  Location: [facility];  Service: Cardiovascular;  Laterality: Left;   LUNG BIOPSY  12/30/2011   has lung spots   PACEMAKER IMPLANT  07/14/2021   PACEMAKER LEADLESS INSERTION N/A 07/14/2021   Procedure: PACEMAKER LEADLESS INSERTION;  Surgeon: Ammon Blunt, MD;  Location: [facility];  Service: Cardiovascular;  Laterality: N/A;   PERIPHERAL VASCULAR CATHETERIZATION Left 06/01/2016   Procedure: Lower Extremity Angiography;  Surgeon: Selinda RAMAN Marea, MD;  Location: [facility];  Service: Cardiovascular;  Laterality: Left;   PERIPHERAL VASCULAR CATHETERIZATION N/A 06/01/2016   Procedure: Abdominal Aortogram w/Lower Extremity;  Surgeon: Selinda RAMAN Marea, MD;  Location: [facility];  Service: Cardiovascular;  Laterality: N/A;   PERIPHERAL VASCULAR CATHETERIZATION  06/01/2016   Procedure: Lower Extremity Intervention;  Surgeon: Selinda RAMAN Marea, MD;  Location: [facility];  Service: Cardiovascular;;   PERIPHERAL VASCULAR CATHETERIZATION Right 06/08/2016   Procedure: Lower Extremity Angiography;  Surgeon: Selinda RAMAN Marea, MD;  Location: [facility];  Service: Cardiovascular;  Laterality: Right;   PERIPHERAL VASCULAR CATHETERIZATION  06/08/2016   Procedure: Lower Extremity Intervention;  Surgeon: Selinda RAMAN Marea, MD;  Location: [facility];  Service: Cardiovascular;;   SUBMANDIBULAR GLAND EXCISION Left 12/06/2020   Procedure: EXCISION SUBMANDIBULAR GLAND;  Surgeon: Herminio Miu, MD;  Location: [facility];  Service: ENT;  Laterality: Left;  needs to be first case Diabetic - diet controlled   TEMPORARY PACEMAKER N/A 07/11/2021   Procedure: TEMPORARY PACEMAKER;  Surgeon: Ammon Blunt, MD;  Location: [facility]  INV[facility];  Service: Cardiovascular;  Laterality: N/A;     FAMILY HISTORY   Family History  Problem Relation Age of Onset   Diabetes Mother    Hypercholesterolemia Mother    Lung cancer Father    Diabetes Sister    Diabetes Sister    Hypertension Sister    Diabetes Maternal Grandmother    Diabetes Paternal Grandmother    Heart attack Brother    Coronary artery disease Brother    Vascular Disease Brother    Heart attack Brother    Breast cancer Neg Hx      SOCIAL HISTORY   Social History   Tobacco Use   Smoking status: Former    Current packs/day: 0.00    Average packs/day: 1 pack/day for 37.0 years (37.0 ttl pk-yrs)    Types: Cigarettes    Start date: 02/06/1973    Quit date: 02/06/2010    Years since quitting: 14.1   Smokeless tobacco: Former    Types: Snuff  Vaping Use  Vaping status: Never Used  Substance Use Topics   Alcohol use: Not Currently    Alcohol/week: 5.0 standard drinks of alcohol    Types: 5 Cans of beer per week    Comment: /h x of alcohol abuse -stopped 2012- now drinks 5 beer per week   Drug use: Not Currently    Types: Marijuana, Crack cocaine, Cocaine    Comment: hx of cocaine use- last use 2015; last use marijuana6/22/19,     MEDICATIONS    Home Medication:    Current Medication:  Current Facility-Administered Medications:    acetaminophen  (TYLENOL ) tablet 650 mg, 650 mg, Oral, Q6H PRN **OR** acetaminophen  (TYLENOL ) suppository 650 mg, 650 mg, Rectal, Q6H PRN, Tobie Cathaleen RAMAN, RPH   albuterol  (PROVENTIL ) (2.5 MG/3ML) 0.083% nebulizer solution 2.5 mg, 2.5 mg, Nebulization, Q2H PRN, Cleatus Hoof V, MD, 2.5 mg at 03/21/24 1202   ALPRAZolam  (XANAX ) tablet 0.5 mg, 0.5 mg, Oral, BID PRN, Cleatus Hoof GAILS, MD, 0.5 mg at 03/23/24 2041   aspirin  EC tablet 81 mg, 81 mg, Oral, Daily, Cleatus Hoof GAILS, MD, 81 mg at 03/23/24 9175   atorvastatin  (LIPITOR) tablet 10 mg, 10 mg, Oral, Daily, Cleatus Hoof GAILS, MD, 10 mg at 03/23/24 9175    calcium  carbonate (OS-CAL - dosed in mg of elemental calcium ) tablet 1,250 mg, 500 mg of elemental calcium , Oral, BID WC, Dezii, Alexandra, DO, 1,250 mg at 03/23/24 1648   clopidogrel  (PLAVIX ) tablet 75 mg, 75 mg, Oral, Daily, Cleatus Hoof V, MD, 75 mg at 03/23/24 0825   cyclobenzaprine  (FLEXERIL ) tablet 10 mg, 10 mg, Oral, QHS PRN, Cleatus Hoof GAILS, MD, 10 mg at 03/23/24 2041   enoxaparin  (LOVENOX ) injection 40 mg, 40 mg, Subcutaneous, Q24H, Cleatus Hoof GAILS, MD, 40 mg at 03/23/24 0825   escitalopram  (LEXAPRO ) tablet 10 mg, 10 mg, Oral, Daily, Cleatus Hoof V, MD, 10 mg at 03/23/24 9175   ferrous sulfate  tablet 325 mg, 325 mg, Oral, BID WC, Cleatus Hoof V, MD, 325 mg at 03/23/24 1648   fluticasone  furoate-vilanterol (BREO ELLIPTA ) 100-25 MCG/ACT 1 puff, 1 puff, Inhalation, Daily, Awanda City, MD, 1 puff at 03/23/24 9173   furosemide  (LASIX ) injection 40 mg, 40 mg, Intravenous, Daily, Marcellis Frampton, MD, 40 mg at 03/23/24 1018   guaiFENesin -dextromethorphan  (ROBITUSSIN DM) 100-10 MG/5ML syrup 30 mL, 30 mL, Oral, Q4H PRN, Cleatus Hoof GAILS, MD   hydrALAZINE  (APRESOLINE ) injection 10 mg, 10 mg, Intravenous, Q6H PRN, Awanda City, MD   insulin  aspart (novoLOG ) injection 0-20 Units, 0-20 Units, Subcutaneous, TID WC, Cleatus Hoof GAILS, MD, 15 Units at 03/23/24 1648   insulin  aspart (novoLOG ) injection 0-5 Units, 0-5 Units, Subcutaneous, QHS, Cleatus Hoof GAILS, MD, 2 Units at 03/22/24 2126   insulin  glargine-yfgn (SEMGLEE ) injection 5 Units, 5 Units, Subcutaneous, Daily, Awanda City, MD, 5 Units at 03/23/24 1019   ipratropium-albuterol  (DUONEB) 0.5-2.5 (3) MG/3ML nebulizer solution 3 mL, 3 mL, Nebulization, TID, Awanda City, MD, 3 mL at 03/23/24 2105   losartan  (COZAAR ) tablet 12.5 mg, 12.5 mg, Oral, Daily, Froylan Hobby, MD   metoprolol  succinate (TOPROL -XL) 24 hr tablet 50 mg, 50 mg, Oral, Daily, Cleatus Hoof V, MD, 50 mg at 03/23/24 9175   ondansetron  (ZOFRAN ) tablet 4 mg, 4 mg, Oral, Q6H PRN **OR** ondansetron   (ZOFRAN ) injection 4 mg, 4 mg, Intravenous, Q6H PRN, Cleatus Hoof GAILS, MD   oxyCODONE -acetaminophen  (PERCOCET/ROXICET) 5-325 MG per tablet 1 tablet, 1 tablet, Oral, Q6H PRN, Cleatus Hoof GAILS, MD, 1 tablet at 03/23/24 1648   potassium chloride  (KLOR-CON   M) CR tablet 10 mEq, 10 mEq, Oral, QODAY, Cleatus Delayne GAILS, MD, 10 mEq at 03/22/24 9146   predniSONE  (DELTASONE ) tablet 50 mg, 50 mg, Oral, Q breakfast, Leena Tiede, MD   pregabalin  (LYRICA ) capsule 75 mg, 75 mg, Oral, BID, Awanda City, MD, 75 mg at 03/23/24 2041   sodium chloride  flush (NS) 0.9 % injection 10-40 mL, 10-40 mL, Intracatheter, PRN, Dezii, Alexandra, DO   tranexamic acid  (CYKLOKAPRON ) 1000 MG/10ML nebulizer solution 500 mg, 500 mg, Nebulization, Q8H, Leroi Haque, MD, 500 mg at 03/23/24 0512   vitamin B-12 (CYANOCOBALAMIN ) tablet 100 mcg, 100 mcg, Oral, Daily, Dezii, Alexandra, DO, 100 mcg at 03/23/24 0825    ALLERGIES   Iodinated contrast media, Trelegy ellipta  [fluticasone -umeclidin-vilant], and Bactoshield chg [chlorhexidine  gluconate]     REVIEW OF SYSTEMS    Review of Systems:  Gen:  Denies  fever, sweats, chills weigh loss  HEENT: Denies blurred vision, double vision, ear pain, eye pain, hearing loss, nose bleeds, sore throat Cardiac:  No dizziness, chest pain or heaviness, chest tightness,edema Resp:   reports dyspnea chronically  Gi: Denies swallowing difficulty, stomach pain, nausea or vomiting, diarrhea, constipation, bowel incontinence Gu:  Denies bladder incontinence, burning urine Ext:   Denies Joint pain, stiffness or swelling Skin: Denies  skin rash, easy bruising or bleeding or hives Endoc:  Denies polyuria, polydipsia , polyphagia or weight change Psych:   Denies depression, insomnia or hallucinations   Other:  All other systems negative   VS: BP (!) 166/92 (BP Location: Left Arm)   Pulse 76   Temp 98.2 F (36.8 C) (Oral)   Resp 18   Ht 4' 11 (1.499 m)   Wt 55 kg   SpO2 100%   BMI 24.50  kg/m      PHYSICAL EXAM    GENERAL:NAD, no fevers, chills, no weakness no fatigue HEAD: Normocephalic, atraumatic.  EYES: Pupils equal, round, reactive to light. Extraocular muscles intact. No scleral icterus.  MOUTH: Moist mucosal membrane. Dentition intact. No abscess noted.  EAR, NOSE, THROAT: Clear without exudates. No external lesions.  NECK: Supple. No thyromegaly. No nodules. No JVD.  PULMONARY: decreased breath sounds with mild rhonchi worse at bases bilaterally.  CARDIOVASCULAR: S1 and S2. Regular rate and rhythm. No murmurs, rubs, or gallops. No edema. Pedal pulses 2+ bilaterally.  GASTROINTESTINAL: Soft, nontender, nondistended. No masses. Positive bowel sounds. No hepatosplenomegaly.  MUSCULOSKELETAL: No swelling, clubbing, or edema. Range of motion full in all extremities.  NEUROLOGIC: Cranial nerves II through XII are intact. No gross focal neurological deficits. Sensation intact. Reflexes intact.  SKIN: No ulceration, lesions, rashes, or cyanosis. Skin warm and dry. Turgor intact.  PSYCHIATRIC: Mood, affect within normal limits. The patient is awake, alert and oriented x 3. Insight, judgment intact.       IMAGING   Narrative & Impression  EXAM: CTA of the Chest with contrast for PE 03/23/2024 07:47:00 AM   TECHNIQUE: CTA of the chest was performed after the administration of intravenous contrast. Multiplanar reformatted images are provided for review. MIP images are provided for review. Automated exposure control, iterative reconstruction, and/or weight based adjustment of the mA/kV was utilized to reduce the radiation dose to as low as reasonably achievable.   COMPARISON: CT pulmonary angiogram dated 01/28/2024.   CLINICAL HISTORY: Dyspnea, chronic, chest wall or pleura disease suspected; Prior lung cancer, recurrent pneumothorax, known COPD, has been treated for pneumonia during this admission. Ongoing hemoptysis and dyspnea. Concern for malignancy vs  PE. Allergic patient received  13-hour allergy prep; Hospital course: SHEVA MCDOUGLE is a 68 year old female with COPD on 4 L nasal cannula at baseline, right lung cancer status post radiation therapy, recurrent right pneumothorax x 2 most recently August 2024, history of heart failure with preserved EF 50%, hypertension, diabetes, PAD, was admitted with acute respiratory failure requiring BiPAP. On EMS arrival to the home she was saturating in the 50s on room air and endorsed recent fever and cough. She was admitted for treatment of community-acquired pneumonia.   FINDINGS:   PULMONARY ARTERIES: Pulmonary arteries are adequately opacified for evaluation. No pulmonary embolism.   MEDIASTINUM: The heart is borderline size. A leadless cardiac pacemaker is noted within the right ventricle.   LYMPH NODES: No mediastinal, hilar or axillary lymphadenopathy.   LUNGS AND PLEURA: There are patchy and coarse areas of opacification/consolidation within the right lower lobe. There is moderate central lobular emphysema. There is mild right-sided pleural effusion/pleural thickening.   UPPER ABDOMEN: Limited images of the upper abdomen are unremarkable.   SOFT TISSUES AND BONES: No acute bone or soft tissue abnormality.   IMPRESSION: 1. No evidence of pulmonary embolism. 2. Patchy and coarse areas of opacification/consolidation within the right lower lobe, possibly related to recent pneumonia. 3. Mild right-sided pleural effusion/pleural thickening. 4. Moderate central lobular emphysema.   Electronically signed by: evalene coho 03/23/2024 08:14 AM EDT RP Workstation: [id number]      ASSESSMENT/PLAN   Non massive hemoptysis    - patient may have PE - there is CTA in progress    - additional etiology may include cancer progression, necrotizing pneumonia, medication related hemoptysis, CHF related hemoptysis    - CRP    - RVP and COVID testing    - procalcitonin trend    -  respiratory cultures    - nebulized txA q8h    - continue Rocephin  zithromax  for CAP coverage    - dc hypertonic saline as it may potentiate hemoptysis   Right pleural effusion      With associated compressive atelectasis    - Currently not big enough for thoracentesis - will attempt to diurese and consider IR consult -continue lasix  20mg  at dc   Acute exacerbation of COPD    - continue current care path    - noted patient on nebulizer therapy     - continue IS    - continue pred with 50mg  po and daily taper by 5mg  with zithromax  at 250mg  po daily x 4 days more    Acute on chronic hypoxemic respiratory failure     - wean O2 to goal >88%     - chest physiotherapy and PT           Thank you for allowing me to participate in the care of this patient.   Patient/Family are satisfied with care plan and all questions have been answered.    Provider disclosure: Patient with at least one acute or chronic illness or injury that poses a threat to life or bodily function and is being managed actively during this encounter.  All of the below services have been performed independently by signing provider:  review of prior documentation from internal and or external health records.  Review of previous and current lab results.  Interview and comprehensive assessment during patient visit today. Review of current and previous chest radiographs/CT scans. Discussion of management and test interpretation with health care team and patient/family.   This document was prepared using Conservation officer, historic buildings and may  include unintentional dictation errors.     Harold Mattes, M.D.  Division of Pulmonary & Critical Care Medicine

## 2024-03-26 DIAGNOSIS — J441 Chronic obstructive pulmonary disease with (acute) exacerbation: Secondary | ICD-10-CM | POA: Diagnosis not present

## 2024-03-26 LAB — CULTURE, RESPIRATORY W GRAM STAIN: Culture: NORMAL

## 2024-03-27 ENCOUNTER — Telehealth: Payer: Self-pay

## 2024-03-27 NOTE — Transitions of Care (Post Inpatient/ED Visit) (Signed)
   03/27/2024  Name: Angela Jensen MRN: [id number] DOB: 1956-07-28  Today's TOC FU Call Status: Today's TOC FU Call Status:: Unsuccessful Call (1st Attempt) Unsuccessful Call (1st Attempt) Date: 03/27/24  Attempted to reach the patient regarding the most recent Inpatient/ED visit.  Follow Up Plan: Additional outreach attempts will be made to reach the patient to complete the Transitions of Care (Post Inpatient/ED visit) call.    Bing Edison MSN, RN RN Case Sales executive Health  [facility]Population [facility] Office Hours M-F [phone] Direct Dial : [phone] Main Phone [phone]  Fax: [phone] [email or web address]

## 2024-03-28 ENCOUNTER — Telehealth: Payer: Self-pay

## 2024-03-28 DIAGNOSIS — J449 Chronic obstructive pulmonary disease, unspecified: Secondary | ICD-10-CM | POA: Diagnosis not present

## 2024-03-28 NOTE — Patient Instructions (Signed)
 Visit Information  Thank you for taking time to visit with me today. Please don't hesitate to contact me if I can be of assistance to you before our next scheduled telephone appointment.  Our next appointment is by telephone on 8/20 or 8/21 at 330 pm.   Following is a copy of your care plan:   Goals Addressed             This Visit's Progress    [facility] Transitions of Care (TOC) Care Plan       Problems:  Recent Hospitalization for treatment of COPD and DMII Knowledge Deficit Related to Recent hospitalization for COPD exacerbation, Htx of DMII, CHF, respiratory failure  Goal:  Over the next 30 days, the patient will not experience hospital readmission  Interventions:  Transitions of Care: Discussed with patient the reason/rationale for TOC follow up call post discharge.  Reviewed understanding of medication review/list, discharge instructions, when to call provider, follow up appointments.  Durable Medical Equipment (DME) reviewed with patient/caregiver. Home oxygen  via [employer] currently at 4L/[place]. Patient has a Water engineer.  Doctor Visits  - discussed the importance of doctor visits and reviewed upcoming appointments.  Home Health: Was active with [facility] prior to admission and has resumption orders. Agency reached out and left a message.  Contact information given to patient if does not hear from agency tomorrow.   Patient Self Care Activities:  Attend all scheduled provider appointments Call pharmacy for medication refills 3-7 days in advance of running out of medications Call provider office for new concerns or questions  Participate in Transition of Care Program/Attend TOC scheduled calls Perform all self care activities independently  Take medications as prescribed   develop a rescue plan follow rescue plan if symptoms flare-up keep follow-up appointments: With PCP and specialists as scheduled.  Continue to monitor blood pressure, blood sugar, weights as currently  doing and keep in a log to take to providers.  Let PCP know if blood pressure does not settle down to normal in next day  Reports blood pressure normally systolic: 130's but has been higher since home from discharge.   Plan:  An initial telephone outreach has been scheduled for:  04/05/24 Review knowledge related to discharge diagnosis/education as needed.  Discuss/review rescue plan for COPD/Respiratory failure Review PCP appointment and other specialty appointments upcoming when completed.         Patient verbalizes understanding of instructions and care plan provided today and agrees to view in MyChart. Active MyChart status and patient understanding of how to access instructions and care plan via MyChart confirmed with patient.     Telephone follow up appointment with care management team member scheduled for:Our next appointment is by telephone on 8/20 or 8/21 at 330 pm.   Please call the care guide team at [phone] if you need to cancel or reschedule your appointment.   Please call the Suicide and Crisis Lifeline: 988 call 1-800-273-TALK (toll free, 24 hour hotline) call 911 if you are experiencing a Mental Health or Behavioral Health Crisis or need someone to talk to.   Bing Edison MSN, RN RN Case Sales executive Health  [facility]Population [facility] Office Hours M-F [phone] Direct Dial : [phone] Main Phone [phone]  Fax: [phone] [email or web address]

## 2024-03-28 NOTE — Transitions of Care (Post Inpatient/ED Visit) (Signed)
 Today's TOC FU Call Status: Today's TOC FU Call Status:: Successful TOC FU Call Completed Unsuccessful Call (1st Attempt) Date: 03/27/24 [facility] FU Call Complete Date: 03/28/24 Patient's Name and Date of Birth confirmed.  Transition Care Management Follow-up Telephone Call Date of Discharge: 03/24/24 Discharge Facility: [facility]) Type of Discharge: Inpatient Admission How have you been since you were released from the hospital?: Better Any questions or concerns?: No  Items Reviewed: Did you receive and understand the discharge instructions provided?: Yes Medications obtained,verified, and reconciled?: Yes (Medications Reviewed) Any new allergies since your discharge?: No Dietary orders reviewed?: NA Do you have support at home?: Yes People in Home [RPT]: friend(s) Name of Support/Comfort Primary Source: Angela Jensen (Friend)  [phone] (Home Phone)  Medications Reviewed Today: Medications Reviewed Today     Reviewed by Carolee Heron NOVAK, RN (Case Manager) on 03/28/24 at 1602  Med List Status: <None>   Medication Order Taking? Sig Documenting Provider Last Dose Status Informant  ACCU-CHEK GUIDE TEST test strip [id number] Yes USE TO TEST BLOOD SUGAR 5 TIMES DAILY Abernathy, Alyssa, NP  Active   Accu-Chek Softclix Lancets lancets [id number] Yes Use 1 lancet 3 times daily to check glucose for diabetes Liana Fish, NP  Active Other, Pharmacy Records  albuterol  (PROVENTIL ) (5 MG/ML) 0.5% nebulizer solution [id number] Yes Take 2.5 mg by nebulization every 6 (six) hours as needed for wheezing or shortness of breath. [provider]  Active   albuterol  (VENTOLIN  HFA) 108 (90 Base) MCG/ACT inhaler [id number] Yes INHALE 2 PUFFS BY MOUTH EVERY 6 HOURS AS NEEDED FOR WHEEZING OR SHORTNESS OF SHERIDA Liana, Alyssa, NP  Active   ALPRAZolam  (XANAX ) 0.5 MG tablet [id number] Yes Take 1 tablet (0.5 mg total) by mouth 2 (two) times daily as needed for anxiety.  Liana Fish, NP  Active   aspirin  EC 81 MG tablet [id number] Yes Take 1 tablet (81 mg total) by mouth daily. Swallow whole. Marea Selinda RAMAN, MD  Active   atorvastatin  (LIPITOR) 10 MG tablet [id number] Yes Take 1 tablet (10 mg total) by mouth daily. Marea Selinda RAMAN, MD  Active   Blood Glucose Monitoring Suppl (ACCU-CHEK GUIDE) w/Device PRESSLEY [id number] Yes Use as directed Dx e11.65 Khan, Fozia M, MD  Active Other, Pharmacy Records  calcium  carbonate (OSCAL) 1500 (600 Ca) MG TABS tablet [id number] Yes Take 600 mg of elemental calcium  by mouth 2 (two) times daily with a meal. [provider]  Active   clopidogrel  (PLAVIX ) 75 MG tablet [id number] Yes Take 1 tablet (75 mg total) by mouth daily. Liana Fish, NP  Active   cyclobenzaprine  (FLEXERIL ) 10 MG tablet [id number] Yes Take 1 tablet (10 mg total) by mouth at bedtime. Take one tab po qhs for back spasm prn only  Patient taking differently: Take 10 mg by mouth at bedtime as needed for muscle spasms. Take one tab po qhs for back spasm prn only   Liana Fish, NP  Active            Med Note MAYER, [facility] R   Thu Dec 23, 2023  2:40 PM) Taking prn  dexamethasone  (DECADRON ) 1 MG tablet [id number] Yes Take 1 mg by mouth. [provider]  Active   Dextromethorphan -guaiFENesin  20-200 MG/20ML LIQD [id number]  Take 30 mLs by mouth every 4 (four) hours as needed.  Patient not taking: Reported on 03/28/2024   Patel, Sona, MD  Active   escitalopram  (LEXAPRO ) 10 MG tablet [id number] Yes Take 1 tablet (10 mg total) by mouth  daily. Liana Fish, NP  Active   ferrous sulfate  325 (65 FE) MG tablet [id number] Yes Take 325 mg by mouth 2 (two) times daily with a meal. [provider]  Active Other, Pharmacy Records  furosemide  (LASIX ) 20 MG tablet [id number] Yes Take 1 tablet (20 mg total) by mouth daily as needed. Liana Fish, NP  Active Other, Pharmacy Records  insulin  glargine, 1 Unit Dial , (TOUJEO ) 300 UNIT/ML Solostar Pen  [id number] Yes Inject 10 Units into the skin daily. Liana Fish, NP  Active            Med Note MAYER, [facility] R   Thu Dec 23, 2023  2:45 PM) Taking differently: 8-10 units daily  Insulin  Pen Needle (RELION PEN NEEDLES) 31G X 6 MM MISC [id number] Yes USE 1 PEN NEEDLE WITH INSULIN  PEN ONCE DAILY FOR DIABETES Liana Fish, NP  Active Other, Pharmacy Records  ipratropium-albuterol  (DUONEB) 0.5-2.5 (3) MG/3ML SOLN [id number] Yes Take 3 mLs by nebulization every 6 (six) hours as needed. Liana Fish, NP  Active   losartan  (COZAAR ) 25 MG tablet [id number] Yes Take 0.5 tablets (12.5 mg total) by mouth daily. Dezii, Alexandra, DO  Active   metFORMIN  (GLUCOPHAGE -XR) 500 MG 24 hr tablet [id number] Yes TAKE 2 TABLETS BY MOUTH ONCE DAILY WITH BREAKFAST Abernathy, Alyssa, NP  Active   metoprolol  succinate (TOPROL -XL) 50 MG 24 hr tablet [id number] Yes Take 1 tablet (50 mg total) by mouth daily. Take with or immediately following a meal. Abernathy, Alyssa, NP  Active   mometasone -formoterol  (DULERA ) 200-5 MCG/ACT AERO [id number] Yes Inhale 2 puffs by mouth twice daily Khan, Fozia M, MD  Active   oxyCODONE -acetaminophen  (PERCOCET/ROXICET) 5-325 MG tablet [id number] Yes Take 1 tablet by mouth every 6 (six) hours as needed for severe pain (pain score 7-10). Liana Fish, NP  Active   OXYGEN  [id number] Yes Inhale 4 L into the lungs. PT USES [facility] FOR OXYGEN  [provider]  Active Other, Pharmacy Records           Med Note MAYER, [facility] R   Thu Dec 23, 2023  2:48 PM) Taking 3 L  potassium chloride  (KLOR-CON ) 10 MEQ tablet [id number] Yes TAKE 1 TABLET BY MOUTH EVERY OTHER DAY Abernathy, Alyssa, NP  Active   predniSONE  (DELTASONE ) 10 MG tablet [id number] Yes Take 2 tablets (20 mg total) by mouth daily with breakfast for 2 days, THEN 1 tablet (10 mg total) daily with breakfast for 2 days. Dezii, Alexandra, DO  Active   pregabalin  (LYRICA ) 75 MG capsule [id number] Yes Take 1 capsule (75 mg total)  by mouth 2 (two) times daily. Brown, Fallon E, NP  Active   SPIRIVA  HANDIHALER 18 MCG inhalation capsule [id number] Yes INHALE THE CONTENTS OF 1 CAPSULES BY MOUTH ONCE DAILY - DO NOT SWALLOW CAPSULES McDonough, Lauren K, PA-C  Active   sulfamethoxazole -trimethoprim  (BACTRIM ) 400-80 MG tablet [id number] Yes Take by mouth. [provider]  Active   vitamin B-12 (CYANOCOBALAMIN ) 100 MCG tablet [id number] Yes Take 100 mcg by mouth daily. [provider]  Active   VITAMIN D , CHOLECALCIFEROL , PO [id number] Yes Take 1 tablet by mouth in the morning. [provider]  Active Other, Pharmacy Records, Self            Home Care and Equipment/Supplies: Were Home Health Services Ordered?: Yes Name of Home Health Agency:: [facility] Has Agency set up a time to come to your home?: No (Has left a message for patient.) Any new equipment or medical supplies ordered?:  (  Patient has rolling walker)  Functional Questionnaire: Do you need assistance with bathing/showering or dressing?: No Do you need assistance with meal preparation?: No Do you need assistance with eating?: No Do you have difficulty maintaining continence: No Do you need assistance with getting out of bed/getting out of a chair/moving?: No Do you have difficulty managing or taking your medications?: No  Follow up appointments reviewed: PCP Follow-up appointment confirmed?: Yes Date of PCP follow-up appointment?: 04/05/24 Follow-up Provider: [facility] Follow-up appointment confirmed?: Yes Date of Specialist follow-up appointment?: 04/05/24 Follow-Up Specialty Provider:: Pulmonary Do you need transportation to your follow-up appointment?: No Do you understand care options if your condition(s) worsen?: Yes-patient verbalized understanding  SDOH Interventions Today    Flowsheet Row Most Recent Value  SDOH Interventions   Food Insecurity Interventions Intervention Not Indicated  Housing  Interventions Intervention Not Indicated  Transportation Interventions Intervention Not Indicated, Patient Resources (Friends/Family)  Utilities Interventions Intervention Not Indicated    Goals      VBCI Transitions of Care (TOC) Care Plan     Problems:  Recent Hospitalization for treatment of COPD and DMII Knowledge Deficit Related to Recent hospitalization for COPD exacerbation, Htx of DMII, CHF, respiratory failure  Goal:  Over the next 30 days, the patient will not experience hospital readmission  Interventions:  Transitions of Care: Discussed with patient the reason/rationale for TOC follow up call post discharge.  Reviewed understanding of medication review/list, discharge instructions, when to call provider, follow up appointments.  Durable Medical Equipment (DME) reviewed with patient/caregiver. Home oxygen  via [employer] currently at 4L/[place]. Patient has a Water engineer.  Doctor Visits  - discussed the importance of doctor visits and reviewed upcoming appointments.  Home Health: Was active with [facility] prior to admission and has resumption orders. Agency reached out and left a message.  Contact information given to patient if does not hear from agency tomorrow.   Patient Self Care Activities:  Attend all scheduled provider appointments Call pharmacy for medication refills 3-7 days in advance of running out of medications Call provider office for new concerns or questions  Participate in Transition of Care Program/Attend TOC scheduled calls Perform all self care activities independently  Take medications as prescribed   develop a rescue plan follow rescue plan if symptoms flare-up keep follow-up appointments: With PCP and specialists as scheduled.  Continue to monitor blood pressure, blood sugar, weights as currently doing and keep in a log to take to providers.  Let PCP know if blood pressure does not settle down to normal in next day  Reports blood pressure normally  systolic: 130's but has been higher since home from discharge.   Plan:  An initial telephone outreach has been scheduled for:  04/05/24 Review knowledge related to discharge diagnosis/education as needed.  Discuss/review rescue plan for COPD/Respiratory failure Review PCP appointment and other specialty appointments upcoming when completed.         Goals Addressed             This Visit's Progress    VBCI Transitions of Care (TOC) Care Plan       Problems:  Recent Hospitalization for treatment of COPD and DMII Knowledge Deficit Related to Recent hospitalization for COPD exacerbation, Htx of DMII, CHF, respiratory failure  Goal:  Over the next 30 days, the patient will not experience hospital readmission  Interventions:  Transitions of Care: Discussed with patient the reason/rationale for TOC follow up call post discharge.  Reviewed understanding of medication review/list,  discharge instructions, when to call provider, follow up appointments.  Durable Medical Equipment (DME) reviewed with patient/caregiver. Home oxygen  via [employer] currently at 4L/[place]. Patient has a Water engineer.  Doctor Visits  - discussed the importance of doctor visits and reviewed upcoming appointments.  Home Health: Was active with [facility] prior to admission and has resumption orders. Agency reached out and left a message.  Contact information given to patient if does not hear from agency tomorrow.   Patient Self Care Activities:  Attend all scheduled provider appointments Call pharmacy for medication refills 3-7 days in advance of running out of medications Call provider office for new concerns or questions  Participate in Transition of Care Program/Attend TOC scheduled calls Perform all self care activities independently  Take medications as prescribed   develop a rescue plan follow rescue plan if symptoms flare-up keep follow-up appointments: With PCP and specialists as scheduled.  Continue to  monitor blood pressure, blood sugar, weights as currently doing and keep in a log to take to providers.  Let PCP know if blood pressure does not settle down to normal in next day  Reports blood pressure normally systolic: 130's but has been higher since home from discharge.   Plan:  An initial telephone outreach has been scheduled for:  04/05/24 Review knowledge related to discharge diagnosis/education as needed.  Discuss/review rescue plan for COPD/Respiratory failure Review PCP appointment and other specialty appointments upcoming when completed.        03/28/24: TOC RN CM completed post discharge follow up outreach call with patient.  Patient verbally agreed to follow up calls in the following weeks or 30 day program Patient has good self care management activities in place.  Will review and update a rescue plan next visit.  Patient understands how to contact insurance.  Follow up all scheduled for next week and will update or add to goals  Bing Edison MSN, RN RN Case Manager [facility]  VBCI-Population Health Office Hours M-F [phone] Direct Dial : [phone] Main Phone [phone]  Fax: [phone] [email or web address]

## 2024-03-29 DIAGNOSIS — J9610 Chronic respiratory failure, unspecified whether with hypoxia or hypercapnia: Secondary | ICD-10-CM | POA: Diagnosis not present

## 2024-03-29 DIAGNOSIS — J449 Chronic obstructive pulmonary disease, unspecified: Secondary | ICD-10-CM | POA: Diagnosis not present

## 2024-03-31 ENCOUNTER — Telehealth: Payer: Self-pay

## 2024-03-31 ENCOUNTER — Other Ambulatory Visit: Payer: Self-pay

## 2024-03-31 ENCOUNTER — Observation Stay
Admission: EM | Admit: 2024-03-31 | Discharge: 2024-04-03 | Disposition: A | Discharge status: Home / Self Care | Attending: Student in an Organized Health Care Education/Training Program | Admitting: Student in an Organized Health Care Education/Training Program

## 2024-03-31 ENCOUNTER — Emergency Department

## 2024-03-31 DIAGNOSIS — Z7982 Long term (current) use of aspirin: Secondary | ICD-10-CM | POA: Insufficient documentation

## 2024-03-31 DIAGNOSIS — Z87891 Personal history of nicotine dependence: Secondary | ICD-10-CM | POA: Diagnosis not present

## 2024-03-31 DIAGNOSIS — K625 Hemorrhage of anus and rectum: Secondary | ICD-10-CM | POA: Diagnosis present

## 2024-03-31 DIAGNOSIS — K635 Polyp of colon: Secondary | ICD-10-CM | POA: Diagnosis not present

## 2024-03-31 DIAGNOSIS — I70213 Atherosclerosis of native arteries of extremities with intermittent claudication, bilateral legs: Secondary | ICD-10-CM | POA: Diagnosis not present

## 2024-03-31 DIAGNOSIS — I495 Sick sinus syndrome: Secondary | ICD-10-CM | POA: Diagnosis present

## 2024-03-31 DIAGNOSIS — E119 Type 2 diabetes mellitus without complications: Secondary | ICD-10-CM | POA: Insufficient documentation

## 2024-03-31 DIAGNOSIS — R918 Other nonspecific abnormal finding of lung field: Secondary | ICD-10-CM | POA: Diagnosis not present

## 2024-03-31 DIAGNOSIS — J189 Pneumonia, unspecified organism: Secondary | ICD-10-CM | POA: Diagnosis not present

## 2024-03-31 DIAGNOSIS — K641 Second degree hemorrhoids: Secondary | ICD-10-CM | POA: Insufficient documentation

## 2024-03-31 DIAGNOSIS — J441 Chronic obstructive pulmonary disease with (acute) exacerbation: Secondary | ICD-10-CM | POA: Diagnosis not present

## 2024-03-31 DIAGNOSIS — K5730 Diverticulosis of large intestine without perforation or abscess without bleeding: Secondary | ICD-10-CM | POA: Insufficient documentation

## 2024-03-31 DIAGNOSIS — K922 Gastrointestinal hemorrhage, unspecified: Secondary | ICD-10-CM | POA: Diagnosis not present

## 2024-03-31 DIAGNOSIS — C3431 Malignant neoplasm of lower lobe, right bronchus or lung: Secondary | ICD-10-CM | POA: Diagnosis not present

## 2024-03-31 DIAGNOSIS — I110 Hypertensive heart disease with heart failure: Secondary | ICD-10-CM | POA: Diagnosis not present

## 2024-03-31 DIAGNOSIS — I70219 Atherosclerosis of native arteries of extremities with intermittent claudication, unspecified extremity: Secondary | ICD-10-CM | POA: Diagnosis present

## 2024-03-31 DIAGNOSIS — Z794 Long term (current) use of insulin: Secondary | ICD-10-CM | POA: Insufficient documentation

## 2024-03-31 DIAGNOSIS — K921 Melena: Secondary | ICD-10-CM | POA: Diagnosis not present

## 2024-03-31 DIAGNOSIS — D122 Benign neoplasm of ascending colon: Principal | ICD-10-CM | POA: Insufficient documentation

## 2024-03-31 DIAGNOSIS — J9611 Chronic respiratory failure with hypoxia: Secondary | ICD-10-CM | POA: Diagnosis not present

## 2024-03-31 DIAGNOSIS — I5032 Chronic diastolic (congestive) heart failure: Secondary | ICD-10-CM | POA: Insufficient documentation

## 2024-03-31 DIAGNOSIS — R0602 Shortness of breath: Secondary | ICD-10-CM | POA: Diagnosis not present

## 2024-03-31 LAB — CBC
HCT: 36.5 % (ref 36.0–46.0)
Hemoglobin: 10.9 g/dL — ABNORMAL LOW (ref 12.0–15.0)
MCH: 28.2 pg (ref 26.0–34.0)
MCHC: 29.9 g/dL — ABNORMAL LOW (ref 30.0–36.0)
MCV: 94.3 fL (ref 80.0–100.0)
Platelets: 283 K/uL (ref 150–400)
RBC: 3.87 MIL/uL (ref 3.87–5.11)
RDW: 14.9 % (ref 11.5–15.5)
WBC: 9.2 K/uL (ref 4.0–10.5)
nRBC: 0 % (ref 0.0–0.2)

## 2024-03-31 LAB — COMPREHENSIVE METABOLIC PANEL WITH GFR
ALT: 37 U/L (ref 0–44)
AST: 39 U/L (ref 15–41)
Albumin: 3.6 g/dL (ref 3.5–5.0)
Alkaline Phosphatase: 80 U/L (ref 38–126)
Anion gap: 11 (ref 5–15)
BUN: 19 mg/dL (ref 8–23)
CO2: 28 mmol/L (ref 22–32)
Calcium: 9.6 mg/dL (ref 8.9–10.3)
Chloride: 101 mmol/L (ref 98–111)
Creatinine, Ser: 0.78 mg/dL (ref 0.44–1.00)
GFR, Estimated: >60 mL/min (ref >60)
Glucose, Bld: 243 mg/dL — ABNORMAL HIGH (ref 70–99)
Potassium: 4.5 mmol/L (ref 3.5–5.1)
Sodium: 140 mmol/L (ref 135–145)
Total Bilirubin: 0.9 mg/dL (ref 0.0–1.2)
Total Protein: 6.9 g/dL (ref 6.5–8.1)

## 2024-03-31 LAB — TYPE AND SCREEN
ABO/RH(D): O POS
Antibody Screen: NEGATIVE

## 2024-03-31 LAB — TROPONIN I (HIGH SENSITIVITY): Troponin I (High Sensitivity): 12 ng/L (ref <18)

## 2024-03-31 LAB — BRAIN NATRIURETIC PEPTIDE: B Natriuretic Peptide: 57.5 pg/mL (ref 0.0–100.0)

## 2024-03-31 MED ORDER — ONDANSETRON HCL 4 MG/2ML IJ SOLN: 4.0000 mg | Freq: Four times a day (QID) | INTRAMUSCULAR | Status: DC | PRN

## 2024-03-31 MED ORDER — IPRATROPIUM-ALBUTEROL 0.5-2.5 (3) MG/3ML IN SOLN
3.0000 mL | Freq: Four times a day (QID) | RESPIRATORY_TRACT | Status: DC
  Administered 2024-04-01: 3 mL via RESPIRATORY_TRACT
  Filled 2024-03-31: qty 3

## 2024-03-31 MED ORDER — VANCOMYCIN HCL 1250 MG/250ML IV SOLN
1250.0000 mg | Freq: Once | INTRAVENOUS | Status: AC
  Administered 2024-03-31: 1250 mg via INTRAVENOUS
  Filled 2024-03-31: qty 250

## 2024-03-31 MED ORDER — ALPRAZOLAM 0.5 MG PO TABS
0.5000 mg | ORAL_TABLET | Freq: Two times a day (BID) | ORAL | Status: DC | PRN
  Administered 2024-04-01 – 2024-04-02 (×2): 0.5 mg via ORAL
  Filled 2024-03-31 (×2): qty 1

## 2024-03-31 MED ORDER — METHYLPREDNISOLONE SODIUM SUCC 125 MG IJ SOLR
125.0000 mg | Freq: Once | INTRAMUSCULAR | Status: AC
  Administered 2024-03-31: 125 mg via INTRAVENOUS
  Filled 2024-03-31: qty 2

## 2024-03-31 MED ORDER — HYDROCODONE-ACETAMINOPHEN 5-325 MG PO TABS
1.0000 | ORAL_TABLET | ORAL | Status: DC | PRN
  Administered 2024-04-01 – 2024-04-02 (×2): 2 via ORAL
  Filled 2024-03-31 (×2): qty 2

## 2024-03-31 MED ORDER — ONDANSETRON HCL 4 MG PO TABS
4.0000 mg | ORAL_TABLET | Freq: Four times a day (QID) | ORAL | Status: DC | PRN
  Administered 2024-04-02: 4 mg via ORAL
  Filled 2024-03-31: qty 1

## 2024-03-31 MED ORDER — ESCITALOPRAM OXALATE 10 MG PO TABS
10.0000 mg | ORAL_TABLET | Freq: Every day | ORAL | Status: DC
  Administered 2024-04-01 – 2024-04-03 (×3): 10 mg via ORAL
  Filled 2024-03-31 (×3): qty 1

## 2024-03-31 MED ORDER — ALBUTEROL SULFATE (2.5 MG/3ML) 0.083% IN NEBU: 2.5000 mg | INHALATION_SOLUTION | RESPIRATORY_TRACT | Status: DC | PRN

## 2024-03-31 MED ORDER — CYCLOBENZAPRINE HCL 10 MG PO TABS: 10.0000 mg | ORAL_TABLET | Freq: Every evening | ORAL | Status: DC | PRN

## 2024-03-31 MED ORDER — PIPERACILLIN-TAZOBACTAM 3.375 G IVPB 30 MIN
3.3750 g | Freq: Once | INTRAVENOUS | Status: AC
  Administered 2024-03-31: 3.375 g via INTRAVENOUS
  Filled 2024-03-31 (×2): qty 50

## 2024-03-31 MED ORDER — GUAIFENESIN ER 600 MG PO TB12
600.0000 mg | ORAL_TABLET | Freq: Two times a day (BID) | ORAL | Status: DC
  Administered 2024-04-01 – 2024-04-03 (×6): 600 mg via ORAL
  Filled 2024-03-31 (×5): qty 1

## 2024-03-31 MED ORDER — LOSARTAN POTASSIUM 25 MG PO TABS
12.5000 mg | ORAL_TABLET | Freq: Every day | ORAL | Status: DC
  Administered 2024-04-01 – 2024-04-03 (×3): 12.5 mg via ORAL
  Filled 2024-03-31 (×3): qty 1

## 2024-03-31 MED ORDER — ACETAMINOPHEN 325 MG PO TABS
650.0000 mg | ORAL_TABLET | Freq: Four times a day (QID) | ORAL | Status: DC | PRN
  Administered 2024-04-01: 650 mg via ORAL
  Filled 2024-03-31: qty 2

## 2024-03-31 MED ORDER — MORPHINE SULFATE (PF) 2 MG/ML IV SOLN: 2.0000 mg | INTRAVENOUS | Status: DC | PRN

## 2024-03-31 MED ORDER — ATORVASTATIN CALCIUM 20 MG PO TABS
10.0000 mg | ORAL_TABLET | Freq: Every day | ORAL | Status: DC
  Administered 2024-04-01 – 2024-04-03 (×3): 10 mg via ORAL
  Filled 2024-03-31 (×3): qty 1

## 2024-03-31 MED ORDER — NICOTINE 14 MG/24HR TD PT24
14.0000 mg | MEDICATED_PATCH | Freq: Every day | TRANSDERMAL | Status: DC
  Filled 2024-03-31 (×3): qty 1

## 2024-03-31 MED ORDER — IPRATROPIUM-ALBUTEROL 0.5-2.5 (3) MG/3ML IN SOLN
9.0000 mL | Freq: Once | RESPIRATORY_TRACT | Status: AC
  Administered 2024-03-31: 9 mL via RESPIRATORY_TRACT
  Filled 2024-03-31: qty 9

## 2024-03-31 MED ORDER — ACETAMINOPHEN 650 MG RE SUPP: 650.0000 mg | Freq: Four times a day (QID) | RECTAL | Status: DC | PRN

## 2024-03-31 NOTE — Assessment & Plan Note (Addendum)
 Recent HCAP 03/21/2024 Chronic respiratory failure with hypoxia Patient has no cough or fever but has increased work of breathing without increased O2 requirement  Chest x-ray unchanged from prior DuoNebs Q6M as needed Received Zosyn  and vancomycin  for possible HCAP Procalcitonin less than 0.10 so antibiotics not continued Follow respiratory viral panel Continue supplemental O2

## 2024-03-31 NOTE — Progress Notes (Signed)
 ED Pharmacy Antibiotic Sign Off An antibiotic consult was received from an ED provider for Vanc, Zosyn  per pharmacy dosing for PNA. A chart review was completed to assess appropriateness.   The following one time order(s) were placed:   Vancomycin  1250 mg IV X 1   Zosyn  3.375 gm IV X 1   Further antibiotic and/or antibiotic pharmacy consults should be ordered by the admitting provider if indicated.   Thank you for allowing pharmacy to be a part of this patient's care.   Silver Selinda BIRCH [facility]  Clinical Pharmacist 03/31/24 10:46 PM

## 2024-03-31 NOTE — Assessment & Plan Note (Addendum)
 Last colonoscopy December 2021 showed extensive diverticulosis and nonbleeding internal hemorrhoids, EGD showed erosive gastropathy Currently on DAPT due to PAD--> hold Plavix  Hold Plavix  Serial H&H and transfuse if needed GI consult

## 2024-03-31 NOTE — Telephone Encounter (Signed)
 Patient called stating her blood pressure has been reading high this morning it was 152/100 and then 152/96 spoke with Alyssa and she is to take 2 of her Losartan  and then we can discuss and she how it is when she comes in on Wednesday for her appointment.

## 2024-03-31 NOTE — ED Triage Notes (Signed)
 Pt to ED via POV from home. Pt reports rectal bleeding that started today and filled up the toilet boil. Pt denies straining with BM. Pt is on Plavix . Pt hx of COPD on 3L [place] chronically. Pt has pacemaker. Pt denies abd pain, SOB or CP.

## 2024-03-31 NOTE — ED Provider Notes (Signed)
 [facility] Provider Note    Event Date/Time   First MD Initiated Contact with Patient 03/31/24 2138     (approximate)   History   Rectal Bleeding   HPI  Angela Jensen is a 68 y.o. female with history of COPD on 3 L nasal cannula at baseline, history of PAD, CAD, history of lung cancer status post resection, presenting with bright red blood per rectum.  States that she went to the bathroom today, noticed bright red blood in the toilet.  States that she had a colonoscopy 2 to 3 years ago that was normal.  She had denied any chest pain or shortness of breath, no abdominal pain or diarrhea, no urinary symptoms.  However after she got into an ED bed, she started to have significant shortness of breath.  She denies any chest pain.  No new cough.  States that she was admitted in early August for pneumonia with some hemoptysis.  No hematemesis or coffee-ground emesis, no recent hemoptysis noted.  On independent chart review, she was discharged in early August, was there for acute on chronic respiratory failure with hypoxia secondary to, also with hemoptysis that resolved after nebulized TXA.  A CT scan that was done as well that showed right-sided pleural effusion and thickening with moderate emphysema as well as consolidation within the right lower lobe.   Physical Exam   Triage Vital Signs: ED Triage Vitals [03/31/24 1846]  Encounter Vitals Group     BP (!) 124/90     Girls Systolic BP Percentile      Girls Diastolic BP Percentile      Boys Systolic BP Percentile      Boys Diastolic BP Percentile      Pulse Rate 70     Resp 18     Temp 98.1 F (36.7 C)     Temp src      SpO2 91 %     Weight 119 lb 0.8 oz (54 kg)     Height 4' 11 (1.499 m)     Head Circumference      Peak Flow      Pain Score 0     Pain Loc      Pain Education      Exclude from Growth Chart     Most recent vital signs: Vitals:   03/31/24 1846  BP: (!) 124/90  Pulse: 70  Resp: 18   Temp: 98.1 F (36.7 C)  SpO2: 91%     General: Awake, no distress.  CV:  Good peripheral perfusion. Resp:  Normal effort.  Tachypneic, bilateral rhonchi with prolonged expiratory phase Abd:  No distention.  Soft nontender Other:  Rectal exam was done as chaperone, no external hemorrhoids, dark maroon stool in rectal vault   ED Results / Procedures / Treatments   Labs (all labs ordered are listed, but only abnormal results are displayed) Labs Reviewed  COMPREHENSIVE METABOLIC PANEL WITH GFR - Abnormal; Notable for the following components:      Result Value   Glucose, Bld 243 (*)    All other components within normal limits  CBC - Abnormal; Notable for the following components:   Hemoglobin 10.9 (*)    MCHC 29.9 (*)    All other components within normal limits  RESP PANEL BY RT-PCR (RSV, FLU A&B, COVID)  RVPGX2  BRAIN NATRIURETIC PEPTIDE  PROCALCITONIN  POC OCCULT BLOOD, ED  TYPE AND SCREEN  TROPONIN I (HIGH SENSITIVITY)  EKG  EKG shows, rate is 85, 1 QRS, prolonged QTc, does not appear to have a P wave before every QRS, suspect Mobitz type I.  Prior EKG showed atrial fibrillation but did show similar P wave patterns.  Not significant change compared to prior   RADIOLOGY On my independent interpretation, chest x-ray shows bilateral opacities, does appear slightly worse compared to prior   PROCEDURES:  Critical Care performed: No  Procedures   MEDICATIONS ORDERED IN ED: Medications  piperacillin -tazobactam (ZOSYN ) IVPB 3.375 g (3.375 g Intravenous New Bag/Given 03/31/24 2255)  vancomycin  (VANCOREADY) IVPB 1250 mg/250 mL (has no administration in time range)  ipratropium-albuterol  (DUONEB) 0.5-2.5 (3) MG/3ML nebulizer solution 9 mL (9 mLs Nebulization Given 03/31/24 2200)  methylPREDNISolone  sodium succinate (SOLU-MEDROL ) 125 mg/2 mL injection 125 mg (125 mg Intravenous Given 03/31/24 2232)     IMPRESSION / MDM / ASSESSMENT AND PLAN / ED COURSE  I reviewed  the triage vital signs and the nursing notes.                              Differential diagnosis includes, but is not limited to, COPD exacerbation, pneumonia, atypical ACS, for the bright red blood per rectum, considered lower GI bleed, anemia.  Labs, chest x-ray, DuoNebs.  Will give some Solu-Medrol  as well.  Will also get a chest x-ray.  Patient's presentation is most consistent with acute presentation with potential threat to life or bodily function.  Independent interpretation of labs and imaging below.  Chest x-ray shows bilateral opacities, appears slightly worse compared to prior on my read, radiology read is still pending.  Given that she is persistent opacities despite treatment for On her previous admission, her on broad-spectrum antibiotics.  She will need to be admitted for further management of her GI bleed as well as COPD exacerbation and pneumonia.  The patient is on the cardiac monitor to evaluate for evidence of arrhythmia and/or significant heart rate changes.   Clinical Course as of 03/31/24 2307  Fri Mar 31, 2024  2249 Independent review of labs, no leukocytosis, electrolytes not severely deranged, not acutely anemic.  BNP is not elevated. [TT]    Clinical Course User Index [TT] Waymond Lorelle Cummins, MD     FINAL CLINICAL IMPRESSION(S) / ED DIAGNOSES   Final diagnoses:  Gastrointestinal hemorrhage, unspecified gastrointestinal hemorrhage type  COPD exacerbation (HCC)  Pneumonia of both lungs due to infectious organism, unspecified part of lung     Rx / DC Orders   ED Discharge Orders     None        Note:  This document was prepared using Dragon voice recognition software and may include unintentional dictation errors.    Waymond Lorelle Cummins, MD 03/31/24 [phone]

## 2024-03-31 NOTE — H&P (Signed)
 History and Physical    Patient: Angela Jensen FMW:[id number] DOB: 02-03-1956 DOA: 03/31/2024 DOS: the patient was seen and examined on 03/31/2024 PCP: Liana Fish, NP  Patient coming from: Home  Chief Complaint:  Chief Complaint  Patient presents with   Rectal Bleeding    HPI: Angela Jensen is a 68 y.o. female with medical history significant for COPD on 4 L home O2, right lung cancer status post radiation therapy, hx pneumothorax, SSS s/p pacemaker, HFpEF(50%), HTN, DM, PAD with history of recent hospitalization (8/3-8 03/24/2024) for respiratory failure secondary to CAP/COPD associated with hemoptysis(treated with TXA), now being admitted for hematochezia as well as concern for HCAP and COPD exacerbation . She initially presented for evaluation of rectal bleeding that started today, however during workup in the ED, she developed acute shortness of breath which resolved with a DuoNeb.  She denied cough or hemoptysis.  Denies nausea, vomiting or hematemesis.  Denies abdominal pain. In the ED initial vitals within normal limits, saturating at 91 on 3 L Labs, including troponin, BNP, CBC, CMP were all unremarkable except for hemoglobin of 10.9(baseline 9.5) and elevated blood glucose of 243. Digital rectal exam with maroon-colored stool EKG showed possible Mobitz type 1 at 76 without acute ST-T wave changes(has pacemaker). Chest x-ray, unofficial read showing no change from prior pneumonia from a week ago. Patient was treated with DuoNebs, Solu-Medrol  and started on Zosyn  and vancomycin      Review of Systems: As mentioned in the history of present illness. All other systems reviewed and are negative.  Past Medical History:  Diagnosis Date   Anemia    Arthritis    Asthma    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/2016   Cancer (HCC) 2012   Right Lung CA   COPD (chronic obstructive pulmonary disease) (HCC)    Depression    Diabetes  mellitus without complication (HCC)    Patient takes Janumet   Essential hypertension 05/26/2016   Heart failure (HCC) 2022   Hydropneumothorax 05/03/2020   Hypercholesteremia    Oxygen  dependent    2L at nite    PAD (peripheral artery disease) (HCC) 06/22/2016   Peripheral vascular disease (HCC)    Personal history of radiation therapy    Shortness of breath dyspnea    with exertion    Sialolithiasis    Sleep apnea    Wears dentures    full upper and lower   Past Surgical History:  Procedure Laterality Date   APPLICATION OF CELL SAVER Left 01/21/2024   Procedure: APPLICATION OF CELL SAVER;  Surgeon: Marea Selinda RAMAN, MD;  Location: [facility];  Service: Vascular;  Laterality: Left;   CESAREAN SECTION     x3   COLONOSCOPY WITH PROPOFOL  N/A 06/25/2015   Procedure: COLONOSCOPY WITH PROPOFOL ;  Surgeon: Rogelia Copping, MD;  Location: [facility];  Service: Endoscopy;  Laterality: N/A;   COLONOSCOPY WITH PROPOFOL  N/A 07/26/2020   Procedure: COLONOSCOPY WITH PROPOFOL ;  Surgeon: Copping Rogelia, MD;  Location: [facility];  Service: Endoscopy;  Laterality: N/A;   CYST REMOVAL LEG     and on shoulder    ENDARTERECTOMY FEMORAL Left 01/21/2024   Procedure: LEFT COMMON, SUPERFICIAL FEMORAL AND PROFUNDA FEMORIS ENDARTERECTOMY WITH BOVINE PERICARDIAL PATCH ANGIOPLASTY;  Surgeon: Marea Selinda RAMAN, MD;  Location: [facility];  Service: Vascular;  Laterality: Left;   ESOPHAGOGASTRODUODENOSCOPY (EGD) WITH PROPOFOL  N/A 07/26/2020   Procedure: ESOPHAGOGASTRODUODENOSCOPY (EGD) WITH PROPOFOL ;  Surgeon: Copping Rogelia, MD;  Location: [facility];  Service: Endoscopy;  Laterality: N/A;  Diabetic - oral meds   INSERTION OF ILIAC STENT Left 01/21/2024   Procedure: INSERTION, STENT, ARTERY, ILIAC;  Surgeon: Marea Selinda RAMAN, MD;  Location: [facility];  Service: Vascular;  Laterality: Left;  AND SFA STENTS   LOWER EXTREMITY ANGIOGRAPHY Left 09/29/2018   Procedure: LOWER EXTREMITY ANGIOGRAPHY;  Surgeon: Marea Selinda RAMAN, MD;   Location: [facility];  Service: Cardiovascular;  Laterality: Left;   LOWER EXTREMITY ANGIOGRAPHY Left 07/29/2023   Procedure: Lower Extremity Angiography;  Surgeon: Marea Selinda RAMAN, MD;  Location: [facility];  Service: Cardiovascular;  Laterality: Left;   LOWER EXTREMITY ANGIOGRAPHY Right 12/20/2023   Procedure: Lower Extremity Angiography;  Surgeon: Marea Selinda RAMAN, MD;  Location: [facility];  Service: Cardiovascular;  Laterality: Right;   LOWER EXTREMITY ANGIOGRAPHY Left 01/17/2024   Procedure: Lower Extremity Angiography;  Surgeon: Marea Selinda RAMAN, MD;  Location: [facility];  Service: Cardiovascular;  Laterality: Left;   LUNG BIOPSY  12/30/2011   has lung spots   PACEMAKER IMPLANT  07/14/2021   PACEMAKER LEADLESS INSERTION N/A 07/14/2021   Procedure: PACEMAKER LEADLESS INSERTION;  Surgeon: Ammon Blunt, MD;  Location: [facility];  Service: Cardiovascular;  Laterality: N/A;   PERIPHERAL VASCULAR CATHETERIZATION Left 06/01/2016   Procedure: Lower Extremity Angiography;  Surgeon: Selinda RAMAN Marea, MD;  Location: [facility];  Service: Cardiovascular;  Laterality: Left;   PERIPHERAL VASCULAR CATHETERIZATION N/A 06/01/2016   Procedure: Abdominal Aortogram w/Lower Extremity;  Surgeon: Selinda RAMAN Marea, MD;  Location: [facility];  Service: Cardiovascular;  Laterality: N/A;   PERIPHERAL VASCULAR CATHETERIZATION  06/01/2016   Procedure: Lower Extremity Intervention;  Surgeon: Selinda RAMAN Marea, MD;  Location: [facility];  Service: Cardiovascular;;   PERIPHERAL VASCULAR CATHETERIZATION Right 06/08/2016   Procedure: Lower Extremity Angiography;  Surgeon: Selinda RAMAN Marea, MD;  Location: [facility];  Service: Cardiovascular;  Laterality: Right;   PERIPHERAL VASCULAR CATHETERIZATION  06/08/2016   Procedure: Lower Extremity Intervention;  Surgeon: Selinda RAMAN Marea, MD;  Location: [facility];  Service: Cardiovascular;;    SUBMANDIBULAR GLAND EXCISION Left 12/06/2020   Procedure: EXCISION SUBMANDIBULAR GLAND;  Surgeon: Herminio Miu, MD;  Location: [facility];  Service: ENT;  Laterality: Left;  needs to be first case Diabetic - diet controlled   TEMPORARY PACEMAKER N/A 07/11/2021   Procedure: TEMPORARY PACEMAKER;  Surgeon: Ammon Blunt, MD;  Location: [facility];  Service: Cardiovascular;  Laterality: N/A;   Social History:  reports that she quit smoking about 14 years ago. Her smoking use included cigarettes. She started smoking about 51 years ago. She has a 37 pack-year smoking history. She has quit using smokeless tobacco.  Her smokeless tobacco use included snuff. She reports that she does not currently use alcohol after a past usage of about 5.0 standard drinks of alcohol per week. She reports that she does not currently use drugs after having used the following drugs: Marijuana, Crack cocaine, and Cocaine.  Allergies  Allergen Reactions   Iodinated Contrast Media Itching   Trelegy Ellipta  [Fluticasone -Umeclidin-Vilant]     Had breathing issues   Bactoshield Chg [Chlorhexidine  Gluconate] Itching and Other (See Comments)    Dry. Flaking, peeling skin    Family History  Problem Relation Age of Onset   Diabetes Mother    Hypercholesterolemia Mother    Lung cancer Father    Diabetes Sister  Diabetes Sister    Hypertension Sister    Diabetes Maternal Grandmother    Diabetes Paternal Grandmother    Heart attack Brother    Coronary artery disease Brother    Vascular Disease Brother    Heart attack Brother    Breast cancer Neg Hx     Prior to Admission medications   Medication Sig Start Date End Date Taking? Authorizing Provider  ACCU-CHEK GUIDE TEST test strip USE TO TEST BLOOD SUGAR 5 TIMES DAILY 09/21/23   Liana Fish, NP  Accu-Chek Softclix Lancets lancets Use 1 lancet 3 times daily to check glucose for diabetes 01/19/23   Liana Fish, NP  albuterol   (PROVENTIL ) (5 MG/ML) 0.5% nebulizer solution Take 2.5 mg by nebulization every 6 (six) hours as needed for wheezing or shortness of breath.    [provider]  albuterol  (VENTOLIN  HFA) 108 (90 Base) MCG/ACT inhaler INHALE 2 PUFFS BY MOUTH EVERY 6 HOURS AS NEEDED FOR WHEEZING OR SHORTNESS OF BREATH 10/13/23   Liana Fish, NP  ALPRAZolam  (XANAX ) 0.5 MG tablet Take 1 tablet (0.5 mg total) by mouth 2 (two) times daily as needed for anxiety. 01/12/24   Liana Fish, NP  aspirin  EC 81 MG tablet Take 1 tablet (81 mg total) by mouth daily. Swallow whole. 07/29/23 07/28/24  Marea Selinda RAMAN, MD  atorvastatin  (LIPITOR) 10 MG tablet Take 1 tablet (10 mg total) by mouth daily. 07/29/23 07/28/24  Marea Selinda RAMAN, MD  Blood Glucose Monitoring Suppl (ACCU-CHEK GUIDE) w/Device KIT Use as directed Dx e11.65 04/11/21   Khan, Fozia M, MD  calcium  carbonate (OSCAL) 1500 (600 Ca) MG TABS tablet Take 600 mg of elemental calcium  by mouth 2 (two) times daily with a meal.    [provider]  clopidogrel  (PLAVIX ) 75 MG tablet Take 1 tablet (75 mg total) by mouth daily. 10/13/23   Liana Fish, NP  cyclobenzaprine  (FLEXERIL ) 10 MG tablet Take 1 tablet (10 mg total) by mouth at bedtime. Take one tab po qhs for back spasm prn only Patient taking differently: Take 10 mg by mouth at bedtime as needed for muscle spasms. Take one tab po qhs for back spasm prn only 10/13/23   Liana Fish, NP  dexamethasone  (DECADRON ) 1 MG tablet Take 1 mg by mouth. 03/08/24 06/06/24  [provider]  Dextromethorphan -guaiFENesin  20-200 MG/20ML LIQD Take 30 mLs by mouth every 4 (four) hours as needed. Patient not taking: Reported on 03/28/2024 01/31/24   Patel, Sona, MD  escitalopram  (LEXAPRO ) 10 MG tablet Take 1 tablet (10 mg total) by mouth daily. 08/12/23   Liana Fish, NP  ferrous sulfate  325 (65 FE) MG tablet Take 325 mg by mouth 2 (two) times daily with a meal.    [provider]  furosemide   (LASIX ) 20 MG tablet Take 1 tablet (20 mg total) by mouth daily as needed. 04/27/23   Liana Fish, NP  insulin  glargine, 1 Unit Dial , (TOUJEO ) 300 UNIT/ML Solostar Pen Inject 10 Units into the skin daily. 07/27/23   Liana Fish, NP  Insulin  Pen Needle (RELION PEN NEEDLES) 31G X 6 MM MISC USE 1 PEN NEEDLE WITH INSULIN  PEN ONCE DAILY FOR DIABETES 05/05/23   Liana Fish, NP  ipratropium-albuterol  (DUONEB) 0.5-2.5 (3) MG/3ML SOLN Take 3 mLs by nebulization every 6 (six) hours as needed. 03/06/24   Abernathy, Alyssa, NP  losartan  (COZAAR ) 25 MG tablet Take 0.5 tablets (12.5 mg total) by mouth daily. 03/25/24 04/24/24  Dezii, Alexandra, DO  metFORMIN  (GLUCOPHAGE -XR) 500 MG 24 hr  tablet TAKE 2 TABLETS BY MOUTH ONCE DAILY WITH BREAKFAST 01/17/24   Abernathy, Alyssa, NP  metoprolol  succinate (TOPROL -XL) 50 MG 24 hr tablet Take 1 tablet (50 mg total) by mouth daily. Take with or immediately following a meal. 03/01/24   Liana Fish, NP  mometasone -formoterol  (DULERA ) 200-5 MCG/ACT AERO Inhale 2 puffs by mouth twice daily 07/18/23   Khan, Fozia M, MD  oxyCODONE -acetaminophen  (PERCOCET/ROXICET) 5-325 MG tablet Take 1 tablet by mouth every 6 (six) hours as needed for severe pain (pain score 7-10). 03/20/24   Liana Fish, NP  OXYGEN  Inhale 4 L into the lungs. PT USES [facility] FOR OXYGEN     [provider]  potassium chloride  (KLOR-CON ) 10 MEQ tablet TAKE 1 TABLET BY MOUTH EVERY OTHER DAY 03/24/24   Liana Fish, NP  pregabalin  (LYRICA ) 75 MG capsule Take 1 capsule (75 mg total) by mouth 2 (two) times daily. 03/06/24   Brown, Fallon E, NP  SPIRIVA  HANDIHALER 18 MCG inhalation capsule INHALE THE CONTENTS OF 1 CAPSULES BY MOUTH ONCE DAILY - DO NOT SWALLOW CAPSULES 06/28/23   McDonough, Tinnie POUR, PA-C  sulfamethoxazole -trimethoprim  (BACTRIM ) 400-80 MG tablet Take by mouth. 03/10/24 04/18/24  [provider]  vitamin B-12 (CYANOCOBALAMIN ) 100 MCG tablet Take 100 mcg by mouth daily.     [provider]  VITAMIN D , CHOLECALCIFEROL , PO Take 1 tablet by mouth in the morning.    [provider]    Physical Exam: Vitals:   03/31/24 1846  BP: (!) 124/90  Pulse: 70  Resp: 18  Temp: 98.1 F (36.7 C)  SpO2: 91%  Weight: 54 kg  Height: 4' 11 (1.499 m)   Physical Exam Vitals and nursing note reviewed.  Constitutional:      General: She is not in acute distress.    Comments: Patient appears dyspneic, speaking in short sentences  HENT:     Head: Normocephalic and atraumatic.  Cardiovascular:     Rate and Rhythm: Normal rate and regular rhythm.     Heart sounds: Normal heart sounds.  Pulmonary:     Effort: Tachypnea present.     Breath sounds: Normal breath sounds.     Comments: Diminished without wheeze Abdominal:     Palpations: Abdomen is soft.     Tenderness: There is no abdominal tenderness.  Neurological:     Mental Status: Mental status is at baseline.     Labs on Admission: I have personally reviewed following labs and imaging studies  CBC: Recent Labs  Lab 03/31/24 1848  WBC 9.2  HGB 10.9*  HCT 36.5  MCV 94.3  PLT 283   Basic Metabolic Panel: Recent Labs  Lab 03/31/24 1848  NA 140  K 4.5  CL 101  CO2 28  GLUCOSE 243*  BUN 19  CREATININE 0.78  CALCIUM  9.6   GFR: Estimated Creatinine Clearance: 50.5 mL/min (by C-G formula based on SCr of 0.78 mg/dL). Liver Function Tests: Recent Labs  Lab 03/31/24 1848  AST 39  ALT 37  ALKPHOS 80  BILITOT 0.9  PROT 6.9  ALBUMIN 3.6   No results for input(s): LIPASE, AMYLASE in the last 168 hours. No results for input(s): AMMONIA in the last 168 hours. Coagulation Profile: No results for input(s): INR, PROTIME in the last 168 hours. Cardiac Enzymes: No results for input(s): CKTOTAL, CKMB, CKMBINDEX, TROPONINI in the last 168 hours. BNP (last 3 results) No results for input(s): PROBNP in the last 8760 hours. HbA1C: No results for input(s): HGBA1C in  the last 72 hours. CBG: No results for input(s): GLUCAP in the last 168 hours. Lipid Profile: No results for input(s): CHOL, HDL, LDLCALC, TRIG, CHOLHDL, LDLDIRECT in the last 72 hours. Thyroid  Function Tests: No results for input(s): TSH, T4TOTAL, FREET4, T3FREE, THYROIDAB in the last 72 hours. Anemia Panel: No results for input(s): VITAMINB12, FOLATE, FERRITIN, TIBC, IRON , RETICCTPCT in the last 72 hours. Urine analysis:    Component Value Date/Time   COLORURINE STRAW (A) 04/17/2023 0930   APPEARANCEUR CLEAR (A) 04/17/2023 0930   APPEARANCEUR Clear 08/06/2022 1541   LABSPEC 1.014 04/17/2023 0930   PHURINE 6.0 04/17/2023 0930   GLUCOSEU NEGATIVE 04/17/2023 0930   HGBUR MODERATE (A) 04/17/2023 0930   BILIRUBINUR NEGATIVE 04/17/2023 0930   BILIRUBINUR Negative 08/06/2022 1541   KETONESUR NEGATIVE 04/17/2023 0930   PROTEINUR NEGATIVE 04/17/2023 0930   UROBILINOGEN 0.2 04/22/2021 0959   NITRITE NEGATIVE 04/17/2023 0930   LEUKOCYTESUR NEGATIVE 04/17/2023 0930    Radiological Exams on Admission: DG Chest 1 View Result Date: 03/31/2024 CLINICAL DATA:  Shortness of breath EXAM: CHEST  1 VIEW COMPARISON:  03/19/2024 FINDINGS: Heart and mediastinal contours within normal limits. Consolidation noted in the right lower lobe and to a lesser extent left lower lobe compatible with pneumonia. Findings similar to prior study. No significant effusion or pneumothorax. No acute bony abnormality. IMPRESSION: Bilateral lower lobe airspace opacities, right greater than left compatible with pneumonia. No real change since prior study. Electronically Signed   By: Franky Crease M.D.   On: 03/31/2024 23:24   Data Reviewed for HPI: Relevant notes from primary care and specialist visits, past discharge summaries as available in EHR, including Care Everywhere. Prior diagnostic testing as pertinent to current admission diagnoses Updated medications and problem lists for  reconciliation ED course, including vitals, labs, imaging, treatment and response to treatment Triage notes, nursing and pharmacy notes and ED provider's notes Notable results as noted above in HPI      Assessment and Plan: * Hematochezia Last colonoscopy December 2021 showed extensive diverticulosis and nonbleeding internal hemorrhoids, EGD showed erosive gastropathy Currently on DAPT due to PAD--> hold Plavix  Hold Plavix  Serial H&H and transfuse if needed GI consult   COPD with acute exacerbation (HCC) Recent HCAP 03/21/2024 Chronic respiratory failure with hypoxia Patient has no cough or fever but has increased work of breathing without increased O2 requirement  Chest x-ray unchanged from prior DuoNebs Q6M as needed Received Zosyn  and vancomycin  for possible HCAP Procalcitonin less than 0.10 so antibiotics not continued Follow respiratory viral panel Continue supplemental O2  SSS (sick sinus syndrome) s/p leadless pacemaker (HCC) No acute issues suspected   Chronic diastolic CHF - Last echo 01/2024: EF 60 to 65%, grade 1 diastolic dysfunction -Clinically euvolemic - Continue home meds   History of lung cancer, right lower lobe History of recurrent right pneumothorax - No evidence of pneumothorax on this admission - Follows outpatient with oncology   Hypertension - Continue home meds, monitor closely.  Titrate as needed   Type 2 diabetes without complication -Sliding scale insulin  coverage   PAD - Continue aspirin , hold Plavix  due to bleeding     DVT prophylaxis: SCD  Consults: GI Dr. Onita  Advance Care Planning:   Code Status: Prior   Family Communication: none  Disposition Plan: Back to previous home environment  Severity of Illness: The appropriate patient status for this patient is OBSERVATION. Observation status is judged to be reasonable and necessary in order to provide the required intensity of service  to ensure the patient's safety. The  patient's presenting symptoms, physical exam findings, and initial radiographic and laboratory data in the context of their medical condition is felt to place them at decreased risk for further clinical deterioration. Furthermore, it is anticipated that the patient will be medically stable for discharge from the hospital within 2 midnights of admission.   Author: Delayne LULLA Solian, MD 03/31/2024 11:52 PM  For on call review [email or web address].

## 2024-04-01 DIAGNOSIS — K921 Melena: Secondary | ICD-10-CM | POA: Diagnosis not present

## 2024-04-01 DIAGNOSIS — J438 Other emphysema: Secondary | ICD-10-CM | POA: Diagnosis not present

## 2024-04-01 LAB — RESP PANEL BY RT-PCR (RSV, FLU A&B, COVID)  RVPGX2
Influenza A by PCR: NEGATIVE
Influenza B by PCR: NEGATIVE
Resp Syncytial Virus by PCR: NEGATIVE
SARS Coronavirus 2 by RT PCR: NEGATIVE

## 2024-04-01 LAB — HEMOGLOBIN
Hemoglobin: 10.5 g/dL — ABNORMAL LOW (ref 12.0–15.0)
Hemoglobin: 10.5 g/dL — ABNORMAL LOW (ref 12.0–15.0)

## 2024-04-01 LAB — PROCALCITONIN: Procalcitonin: <0.1 ng/mL

## 2024-04-01 LAB — TROPONIN I (HIGH SENSITIVITY): Troponin I (High Sensitivity): 17 ng/L (ref <18)

## 2024-04-01 MED ORDER — PREDNISONE 10 MG PO TABS
10.0000 mg | ORAL_TABLET | Freq: Every day | ORAL | Status: DC
  Administered 2024-04-01 – 2024-04-03 (×3): 10 mg via ORAL
  Filled 2024-04-01 (×3): qty 1

## 2024-04-01 MED ORDER — IPRATROPIUM-ALBUTEROL 0.5-2.5 (3) MG/3ML IN SOLN
3.0000 mL | Freq: Three times a day (TID) | RESPIRATORY_TRACT | Status: DC
  Administered 2024-04-01 – 2024-04-03 (×5): 3 mL via RESPIRATORY_TRACT
  Filled 2024-04-01 (×5): qty 3

## 2024-04-01 MED ORDER — IPRATROPIUM-ALBUTEROL 0.5-2.5 (3) MG/3ML IN SOLN
3.0000 mL | Freq: Four times a day (QID) | RESPIRATORY_TRACT | Status: DC
  Administered 2024-04-01: 3 mL via RESPIRATORY_TRACT
  Filled 2024-04-01: qty 3

## 2024-04-01 MED ORDER — PANTOPRAZOLE SODIUM 40 MG IV SOLR
40.0000 mg | INTRAVENOUS | Status: DC
  Administered 2024-04-01 – 2024-04-02 (×2): 40 mg via INTRAVENOUS
  Filled 2024-04-01 (×2): qty 10

## 2024-04-01 MED ORDER — IPRATROPIUM-ALBUTEROL 0.5-2.5 (3) MG/3ML IN SOLN: 3.0000 mL | Freq: Two times a day (BID) | RESPIRATORY_TRACT | Status: DC

## 2024-04-01 NOTE — Progress Notes (Signed)
 PROGRESS NOTE  Angela Jensen    DOB: [birth year], 68 y.o.  FMW:[id number]    Code Status: Full Code   DOA: 03/31/2024   LOS: 0   Brief hospital course  Angela Jensen is a 68 y.o. female with a PMH significant for COPD on 4 L home O2, right lung cancer status post radiation therapy, hx pneumothorax, SSS s/p pacemaker, HFpEF(50%), HTN, DM, PAD with history of recent hospitalization (8/3-8 03/24/2024) for respiratory failure secondary to CAP/COPD associated with hemoptysis(treated with TXA), now being admitted for hematochezia as well as concern for HCAP and COPD exacerbation. In the ED initial vitals within normal limits, saturating at 91 on 3 L (baseline 3-4L) Labs, including troponin, BNP, CBC, CMP were all unremarkable except for hemoglobin of 10.9(baseline 9.5) and elevated blood glucose of 243. Digital rectal exam in ED with maroon-colored stool EKG showed possible Mobitz type 1 at 76 without acute ST-T wave changes(has pacemaker). Chest x-ray, unofficial read showing no change from prior pneumonia from a week ago. Patient was treated with DuoNebs, Solu-Medrol  and started on Zosyn  and vancomycin    04/01/24 -describes blood in stool this am. No abdominal pain.   Assessment & Plan  Principal Problem:   Hematochezia Active Problems:   COPD with acute exacerbation (HCC)   Cancer of lower lobe of right lung (HCC)   Chronic heart failure with preserved ejection fraction (HFpEF) (HCC)   SSS (sick sinus syndrome) s/p leadless pacemaker [facility])   Atherosclerosis of native arteries of extremity with intermittent claudication (HCC)   Chronic respiratory failure with hypoxia (HCC)  Hematochezia Last colonoscopy December 2021 showed extensive diverticulosis and nonbleeding internal hemorrhoids, EGD showed erosive gastropathy Currently on DAPT due to PAD--> hold Plavix  GI consult- planning colonoscopy Monday. CLD today - CBC am   COPD without acute exacerbation- she is at her baseline and denies  symptoms Recent HCAP 03/21/2024 Chronic respiratory failure with hypoxia DuoNebs Q6M as needed Received Zosyn  and vancomycin  for possible HCAP- discontinue  Procalcitonin less than 0.10 Continue supplemental O2   SSS (sick sinus syndrome) s/p leadless pacemaker (HCC) No acute issues suspected   Chronic diastolic CHF - Last echo 01/2024: EF 60 to 65%, grade 1 diastolic dysfunction -Clinically euvolemic - Continue home meds   History of lung cancer, right lower lobe History of recurrent right pneumothorax - No evidence of pneumothorax on this admission - Follows outpatient with oncology   Hypertension - Continue home meds, monitor closely.  Titrate as needed   Type 2 diabetes without complication -Sliding scale insulin  coverage   PAD - Continue aspirin , hold Plavix  due to bleeding  Body mass index is 24.09 kg/m.  VTE ppx: SCDs Start: 03/31/24 2359  Diet:     Diet   Diet NPO time specified Except for: Sips with Meds, Ice Chips   Consultants: GI  Subjective 04/01/24    Pt reports feeling well this am. Feels that she has to have a BM. Denies knowing that she had diverticulosis. Denies abdominal pain or respiratory distress   Objective  Blood pressure (!) 139/92, pulse 77, temperature (!) 97.5 F (36.4 C), resp. rate 16, height 4' 11 (1.499 m), weight 54.1 kg, SpO2 97%.  Intake/Output Summary (Last 24 hours) at 04/01/2024 0747 Last data filed at 03/31/2024 2330 Gross per 24 hour  Intake 50 ml  Output --  Net 50 ml   Filed Weights   03/31/24 1846 04/01/24 0025  Weight: 54 kg 54.1 kg    Physical Exam:  General:  awake, alert, NAD HEENT: atraumatic, clear conjunctiva, anicteric sclera, MMM, hearing grossly normal Respiratory: normal respiratory effort. Cardiovascular: quick capillary refill, normal S1/S2, RRR, no JVD, murmurs Gastrointestinal: soft, NT, ND Nervous: A&O x3. no gross focal neurologic deficits, normal speech Extremities: moves all equally, no  edema, normal tone Skin: dry, intact, normal temperature, normal color. No rashes, lesions or ulcers on exposed skin Psychiatry: normal mood, congruent affect  Labs   I have personally reviewed the following labs and imaging studies CBC    Component Value Date/Time   WBC 9.2 03/31/2024 1848   RBC 3.87 03/31/2024 1848   HGB 10.5 (L) 04/01/2024 0534   HGB 12.7 10/08/2023 1004   HGB 11.6 10/04/2023 1047   HCT 36.5 03/31/2024 1848   HCT 37.6 10/04/2023 1047   PLT 283 03/31/2024 1848   PLT 264 10/08/2023 1004   PLT 236 10/04/2023 1047   MCV 94.3 03/31/2024 1848   MCV 88 10/04/2023 1047   MCV 90 04/17/2014 1055   MCH 28.2 03/31/2024 1848   MCHC 29.9 (L) 03/31/2024 1848   RDW 14.9 03/31/2024 1848   RDW 12.9 10/04/2023 1047   RDW 13.6 04/17/2014 1055   LYMPHSABS 2.7 03/19/2024 2242   LYMPHSABS 0.9 10/04/2023 1047   LYMPHSABS 1.1 04/17/2014 1055   MONOABS 0.7 03/19/2024 2242   MONOABS 0.3 04/17/2014 1055   EOSABS 0.0 03/19/2024 2242   EOSABS 0.1 10/04/2023 1047   EOSABS 0.2 04/17/2014 1055   BASOSABS 0.0 03/19/2024 2242   BASOSABS 0.0 10/04/2023 1047   BASOSABS 0.1 04/17/2014 1055      Latest Ref Rng & Units 03/31/2024    6:48 PM 03/24/2024    4:02 AM 03/22/2024    4:36 AM  BMP  Glucose 70 - 99 mg/dL 756  837  83   BUN 8 - 23 mg/dL 19  26  16    Creatinine 0.44 - 1.00 mg/dL 9.21  9.43  9.42   Sodium 135 - 145 mmol/L 140  143  142   Potassium 3.5 - 5.1 mmol/L 4.5  3.7  3.8   Chloride 98 - 111 mmol/L 101  99  105   CO2 22 - 32 mmol/L 28  36  32   Calcium  8.9 - 10.3 mg/dL 9.6  9.0  8.4     DG Chest 1 View Result Date: 03/31/2024 CLINICAL DATA:  Shortness of breath EXAM: CHEST  1 VIEW COMPARISON:  03/19/2024 FINDINGS: Heart and mediastinal contours within normal limits. Consolidation noted in the right lower lobe and to a lesser extent left lower lobe compatible with pneumonia. Findings similar to prior study. No significant effusion or pneumothorax. No acute bony abnormality.  IMPRESSION: Bilateral lower lobe airspace opacities, right greater than left compatible with pneumonia. No real change since prior study. Electronically Signed   By: Franky Crease M.D.   On: 03/31/2024 23:24    Disposition Plan & Communication  Patient status: Observation  Admitted From: Home Planned disposition location: Home Anticipated discharge date: 8/18 pending colonoscopy   Family Communication: husband at bedside    Author: Marien LITTIE Piety, DO [facility] 04/01/2024, 7:47 AM   Available by Epic secure chat 7AM-7PM. If 7PM-7AM, please contact night-coverage.  [facility] contact information found on [email or web address].

## 2024-04-01 NOTE — Plan of Care (Signed)
  Problem: Health Behavior/Discharge Planning: Goal: Ability to manage health-related needs will improve Outcome: Progressing   Problem: Clinical Measurements: Goal: Diagnostic test results will improve Outcome: Progressing   Problem: Nutrition: Goal: Adequate nutrition will be maintained Outcome: Progressing   Problem: Safety: Goal: Ability to remain free from injury will improve Outcome: Progressing

## 2024-04-01 NOTE — Hospital Course (Signed)
 SABRA

## 2024-04-01 NOTE — Consult Note (Signed)
 Inpatient Consultation   [name] is a 68 y.o. female.  Requesting Provider: Delayne Solian, MD  Date of Admission: 03/31/2024  Date of Consult: 04/01/24   Reason for Consultation: hematochezia   Patient's Chief Complaint:   Chief Complaint  Patient presents with   Rectal Bleeding    68 year old African-American female with history of lung cancer status post systemic chemotherapy and radiation, HFpEF (EF 65% g1dd), SSS status post PPM, chronic hypoxic respiratory failure with 3 to 4 L nasal cannula at all times who presents to the hospital with hematochezia for which GI is consulted.  Patient recently hospitalized earlier this month for acute respiratory failure.  She was discharged as returned presenting with hematochezia.  She reports this has happened before, but was not persistent like this current episode.  She had a few bowel movements with pinkish blood.  Bowel movements were painless.  No melena.  No nausea vomiting appetite changes or weight loss.  Hemoglobin is at baseline around 10.5 currently.  She did have a bowel movement with blood this morning, but this seems to be slowing down per the patient.  Also being treated for respiratory illness based on chest x-ray findings.  On Plavix -last dose yesterday  Denies NSAIDs and anticoagulants Denies family history of gastrointestinal disease and malignancy Previous Endoscopies: EGD and colonoscopy in 2021 with gastritis, internal hemorrhoids and colonic diverticulosis  2016 colonoscopy with 1 tubular adenoma removed   Past Medical History:  Diagnosis Date   Anemia    Arthritis    Asthma    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/2016   Cancer (HCC) 2012   Right Lung CA   COPD (chronic obstructive pulmonary disease) (HCC)    Depression    Diabetes mellitus without complication (HCC)    Patient takes Janumet   Essential hypertension 05/26/2016    Heart failure (HCC) 2022   Hydropneumothorax 05/03/2020   Hypercholesteremia    Oxygen  dependent    2L at nite    PAD (peripheral artery disease) (HCC) 06/22/2016   Peripheral vascular disease (HCC)    Personal history of radiation therapy    Shortness of breath dyspnea    with exertion    Sialolithiasis    Sleep apnea    Wears dentures    full upper and lower    Past Surgical History:  Procedure Laterality Date   APPLICATION OF CELL SAVER Left 01/21/2024   Procedure: APPLICATION OF CELL SAVER;  Surgeon: Marea Selinda RAMAN, MD;  Location: [facility];  Service: Vascular;  Laterality: Left;   CESAREAN SECTION     x3   COLONOSCOPY WITH PROPOFOL  N/A 06/25/2015   Procedure: COLONOSCOPY WITH PROPOFOL ;  Surgeon: Rogelia Copping, MD;  Location: [facility];  Service: Endoscopy;  Laterality: N/A;   COLONOSCOPY WITH PROPOFOL  N/A 07/26/2020   Procedure: COLONOSCOPY WITH PROPOFOL ;  Surgeon: Copping Rogelia, MD;  Location: [facility];  Service: Endoscopy;  Laterality: N/A;   CYST REMOVAL LEG     and on shoulder    ENDARTERECTOMY FEMORAL Left 01/21/2024   Procedure: LEFT COMMON, SUPERFICIAL FEMORAL AND PROFUNDA FEMORIS ENDARTERECTOMY WITH BOVINE PERICARDIAL PATCH ANGIOPLASTY;  Surgeon: Marea Selinda RAMAN, MD;  Location: [facility];  Service: Vascular;  Laterality: Left;   ESOPHAGOGASTRODUODENOSCOPY (EGD) WITH PROPOFOL  N/A 07/26/2020   Procedure: ESOPHAGOGASTRODUODENOSCOPY (EGD) WITH PROPOFOL ;  Surgeon: Copping Rogelia, MD;  Location: [facility];  Service: Endoscopy;  Laterality: N/A;  Diabetic - oral meds  INSERTION OF ILIAC STENT Left 01/21/2024   Procedure: INSERTION, STENT, ARTERY, ILIAC;  Surgeon: Marea Selinda RAMAN, MD;  Location: [facility];  Service: Vascular;  Laterality: Left;  AND SFA STENTS   LOWER EXTREMITY ANGIOGRAPHY Left 09/29/2018   Procedure: LOWER EXTREMITY ANGIOGRAPHY;  Surgeon: Marea Selinda RAMAN, MD;  Location: [facility];  Service: Cardiovascular;  Laterality: Left;   LOWER EXTREMITY  ANGIOGRAPHY Left 07/29/2023   Procedure: Lower Extremity Angiography;  Surgeon: Marea Selinda RAMAN, MD;  Location: [facility];  Service: Cardiovascular;  Laterality: Left;   LOWER EXTREMITY ANGIOGRAPHY Right 12/20/2023   Procedure: Lower Extremity Angiography;  Surgeon: Marea Selinda RAMAN, MD;  Location: [facility];  Service: Cardiovascular;  Laterality: Right;   LOWER EXTREMITY ANGIOGRAPHY Left 01/17/2024   Procedure: Lower Extremity Angiography;  Surgeon: Marea Selinda RAMAN, MD;  Location: [facility];  Service: Cardiovascular;  Laterality: Left;   LUNG BIOPSY  12/30/2011   has lung spots   PACEMAKER IMPLANT  07/14/2021   PACEMAKER LEADLESS INSERTION N/A 07/14/2021   Procedure: PACEMAKER LEADLESS INSERTION;  Surgeon: Ammon Blunt, MD;  Location: [facility];  Service: Cardiovascular;  Laterality: N/A;   PERIPHERAL VASCULAR CATHETERIZATION Left 06/01/2016   Procedure: Lower Extremity Angiography;  Surgeon: Selinda RAMAN Marea, MD;  Location: [facility];  Service: Cardiovascular;  Laterality: Left;   PERIPHERAL VASCULAR CATHETERIZATION N/A 06/01/2016   Procedure: Abdominal Aortogram w/Lower Extremity;  Surgeon: Selinda RAMAN Marea, MD;  Location: [facility];  Service: Cardiovascular;  Laterality: N/A;   PERIPHERAL VASCULAR CATHETERIZATION  06/01/2016   Procedure: Lower Extremity Intervention;  Surgeon: Selinda RAMAN Marea, MD;  Location: [facility];  Service: Cardiovascular;;   PERIPHERAL VASCULAR CATHETERIZATION Right 06/08/2016   Procedure: Lower Extremity Angiography;  Surgeon: Selinda RAMAN Marea, MD;  Location: [facility];  Service: Cardiovascular;  Laterality: Right;   PERIPHERAL VASCULAR CATHETERIZATION  06/08/2016   Procedure: Lower Extremity Intervention;  Surgeon: Selinda RAMAN Marea, MD;  Location: [facility];  Service: Cardiovascular;;   SUBMANDIBULAR GLAND EXCISION Left 12/06/2020   Procedure: EXCISION SUBMANDIBULAR GLAND;  Surgeon: Herminio Miu, MD;  Location: [facility];  Service: ENT;  Laterality: Left;  needs to be first case Diabetic - diet controlled   TEMPORARY PACEMAKER N/A 07/11/2021   Procedure: TEMPORARY PACEMAKER;  Surgeon: Ammon Blunt, MD;  Location: [facility];  Service: Cardiovascular;  Laterality: N/A;    Allergies  Allergen Reactions   Iodinated Contrast Media Itching   Trelegy Ellipta  [Fluticasone -Umeclidin-Vilant]     Had breathing issues   Bactoshield Chg [Chlorhexidine  Gluconate] Itching and Other (See Comments)    Dry. Flaking, peeling skin    Family History  Problem Relation Age of Onset   Diabetes Mother    Hypercholesterolemia Mother    Lung cancer Father    Diabetes Sister    Diabetes Sister    Hypertension Sister    Diabetes Maternal Grandmother    Diabetes Paternal Grandmother    Heart attack Brother    Coronary artery disease Brother    Vascular Disease Brother    Heart attack Brother    Breast cancer Neg Hx     Social History   Tobacco Use   Smoking status: Former    Current packs/day: 0.00    Average packs/day: 1 pack/day for 37.0 years (37.0 ttl pk-yrs)    Types: Cigarettes    Start date: 02/06/1973    Quit  date: 02/06/2010    Years since quitting: 14.1   Smokeless tobacco: Former    Types: Snuff  Vaping Use   Vaping status: Never Used  Substance Use Topics   Alcohol use: Not Currently    Alcohol/week: 5.0 standard drinks of alcohol    Types: 5 Cans of beer per week    Comment: /h x of alcohol abuse -stopped 2012- now drinks 5 beer per week   Drug use: Not Currently    Types: Marijuana, Crack cocaine, Cocaine    Comment: hx of cocaine use- last use 2015; last use marijuana6/22/19,     Pertinent GI related history and allergies were reviewed with the patient  Review of Systems  Constitutional:  Negative for activity change, appetite change, chills, diaphoresis, fatigue, fever and unexpected weight change.  HENT:  Negative for  trouble swallowing and voice change.   Respiratory:  Positive for chest tightness and shortness of breath. Negative for wheezing.   Cardiovascular:  Negative for chest pain, palpitations and leg swelling.  Gastrointestinal:  Positive for blood in stool. Negative for abdominal distention, abdominal pain, anal bleeding, constipation, diarrhea, nausea, rectal pain and vomiting.  Musculoskeletal:  Negative for arthralgias and myalgias.  Skin:  Negative for color change and pallor.  Neurological:  Negative for dizziness, syncope and weakness.  Psychiatric/Behavioral:  Negative for confusion.   All other systems reviewed and are negative.    Medications Home Medications No current facility-administered medications on file prior to encounter.   Current Outpatient Medications on File Prior to Encounter  Medication Sig Dispense Refill   ACCU-CHEK GUIDE TEST test strip USE TO TEST BLOOD SUGAR 5 TIMES DAILY 450 each 0   Accu-Chek Softclix Lancets lancets Use 1 lancet 3 times daily to check glucose for diabetes 300 each 1   albuterol  (PROVENTIL ) (5 MG/ML) 0.5% nebulizer solution Take 2.5 mg by nebulization every 6 (six) hours as needed for wheezing or shortness of breath.     albuterol  (VENTOLIN  HFA) 108 (90 Base) MCG/ACT inhaler INHALE 2 PUFFS BY MOUTH EVERY 6 HOURS AS NEEDED FOR WHEEZING OR SHORTNESS OF BREATH 3 each 5   ALPRAZolam  (XANAX ) 0.5 MG tablet Take 1 tablet (0.5 mg total) by mouth 2 (two) times daily as needed for anxiety. 60 tablet 2   aspirin  EC 81 MG tablet Take 1 tablet (81 mg total) by mouth daily. Swallow whole. 150 tablet 2   atorvastatin  (LIPITOR) 10 MG tablet Take 1 tablet (10 mg total) by mouth daily. 30 tablet 11   Blood Glucose Monitoring Suppl (ACCU-CHEK GUIDE) w/Device KIT Use as directed Dx e11.65 1 kit 0   calcium  carbonate (OSCAL) 1500 (600 Ca) MG TABS tablet Take 600 mg of elemental calcium  by mouth 2 (two) times daily with a meal.     clopidogrel  (PLAVIX ) 75 MG tablet  Take 1 tablet (75 mg total) by mouth daily. 90 tablet 3   cyclobenzaprine  (FLEXERIL ) 10 MG tablet Take 1 tablet (10 mg total) by mouth at bedtime. Take one tab po qhs for back spasm prn only (Patient taking differently: Take 10 mg by mouth at bedtime as needed for muscle spasms. Take one tab po qhs for back spasm prn only) 30 tablet 3   dexamethasone  (DECADRON ) 1 MG tablet Take 1 mg by mouth.     Dextromethorphan -guaiFENesin  20-200 MG/20ML LIQD Take 30 mLs by mouth every 4 (four) hours as needed. (Patient not taking: Reported on 03/28/2024) 118 mL 0   escitalopram  (LEXAPRO ) 10 MG tablet Take  1 tablet (10 mg total) by mouth daily. 90 tablet 1   ferrous sulfate  325 (65 FE) MG tablet Take 325 mg by mouth 2 (two) times daily with a meal.     furosemide  (LASIX ) 20 MG tablet Take 1 tablet (20 mg total) by mouth daily as needed. 30 tablet 5   insulin  glargine, 1 Unit Dial , (TOUJEO ) 300 UNIT/ML Solostar Pen Inject 10 Units into the skin daily. 1.5 mL 11   Insulin  Pen Needle (RELION PEN NEEDLES) 31G X 6 MM MISC USE 1 PEN NEEDLE WITH INSULIN  PEN ONCE DAILY FOR DIABETES 50 each 3   ipratropium-albuterol  (DUONEB) 0.5-2.5 (3) MG/3ML SOLN Take 3 mLs by nebulization every 6 (six) hours as needed. 360 mL 0   losartan  (COZAAR ) 25 MG tablet Take 0.5 tablets (12.5 mg total) by mouth daily. 30 tablet 0   metFORMIN  (GLUCOPHAGE -XR) 500 MG 24 hr tablet TAKE 2 TABLETS BY MOUTH ONCE DAILY WITH BREAKFAST 180 tablet 0   metoprolol  succinate (TOPROL -XL) 50 MG 24 hr tablet Take 1 tablet (50 mg total) by mouth daily. Take with or immediately following a meal. 90 tablet 1   mometasone -formoterol  (DULERA ) 200-5 MCG/ACT AERO Inhale 2 puffs by mouth twice daily 13 g 12   oxyCODONE -acetaminophen  (PERCOCET/ROXICET) 5-325 MG tablet Take 1 tablet by mouth every 6 (six) hours as needed for severe pain (pain score 7-10). 30 tablet 0   OXYGEN  Inhale 4 L into the lungs. PT USES [facility] FOR OXYGEN      potassium chloride  (KLOR-CON ) 10 MEQ  tablet TAKE 1 TABLET BY MOUTH EVERY OTHER DAY 45 tablet 0   pregabalin  (LYRICA ) 75 MG capsule Take 1 capsule (75 mg total) by mouth 2 (two) times daily. 60 capsule 2   SPIRIVA  HANDIHALER 18 MCG inhalation capsule INHALE THE CONTENTS OF 1 CAPSULES BY MOUTH ONCE DAILY - DO NOT SWALLOW CAPSULES 90 capsule 3   sulfamethoxazole -trimethoprim  (BACTRIM ) 400-80 MG tablet Take by mouth.     vitamin B-12 (CYANOCOBALAMIN ) 100 MCG tablet Take 100 mcg by mouth daily.     VITAMIN D , CHOLECALCIFEROL , PO Take 1 tablet by mouth in the morning.     Pertinent GI related medications were reviewed with the patient  Inpatient Medications  Current Facility-Administered Medications:    acetaminophen  (TYLENOL ) tablet 650 mg, 650 mg, Oral, Q6H PRN **OR** acetaminophen  (TYLENOL ) suppository 650 mg, 650 mg, Rectal, Q6H PRN, Cleatus Delayne GAILS, MD   albuterol  (PROVENTIL ) (2.5 MG/3ML) 0.083% nebulizer solution 2.5 mg, 2.5 mg, Nebulization, Q2H PRN, Cleatus Delayne GAILS, MD   ALPRAZolam  (XANAX ) tablet 0.5 mg, 0.5 mg, Oral, BID PRN, Cleatus Delayne GAILS, MD   atorvastatin  (LIPITOR) tablet 10 mg, 10 mg, Oral, Daily, Cleatus Delayne V, MD   cyclobenzaprine  (FLEXERIL ) tablet 10 mg, 10 mg, Oral, QHS PRN, Cleatus Delayne GAILS, MD   escitalopram  (LEXAPRO ) tablet 10 mg, 10 mg, Oral, Daily, Cleatus Delayne GAILS, MD   guaiFENesin  (MUCINEX ) 12 hr tablet 600 mg, 600 mg, Oral, BID, Cleatus Delayne V, MD, 600 mg at 04/01/24 0059   HYDROcodone -acetaminophen  (NORCO/VICODIN) 5-325 MG per tablet 1-2 tablet, 1-2 tablet, Oral, Q4H PRN, Cleatus Delayne GAILS, MD   ipratropium-albuterol  (DUONEB) 0.5-2.5 (3) MG/3ML nebulizer solution 3 mL, 3 mL, Nebulization, Q6H, Cleatus Delayne V, MD, 3 mL at 04/01/24 0719   losartan  (COZAAR ) tablet 12.5 mg, 12.5 mg, Oral, Daily, Cleatus Delayne GAILS, MD   morphine  (PF) 2 MG/ML injection 2 mg, 2 mg, Intravenous, Q2H PRN, Cleatus Delayne GAILS, MD   nicotine  (NICODERM CQ  -  dosed in mg/24 hours) patch 14 mg, 14 mg, Transdermal, Daily, Cleatus, Delayne GAILS, MD    ondansetron  (ZOFRAN ) tablet 4 mg, 4 mg, Oral, Q6H PRN **OR** ondansetron  (ZOFRAN ) injection 4 mg, 4 mg, Intravenous, Q6H PRN, Cleatus Delayne GAILS, MD   acetaminophen  **OR** acetaminophen , albuterol , ALPRAZolam , cyclobenzaprine , HYDROcodone -acetaminophen , morphine  injection, ondansetron  **OR** ondansetron  (ZOFRAN ) IV   Objective   Vitals:   04/01/24 0014 04/01/24 0025 04/01/24 0518 04/01/24 0722  BP: (!) 139/99  (!) 139/92   Pulse: 87  77   Resp: 16     Temp: 97.8 F (36.6 C)  (!) 97.5 F (36.4 C)   SpO2: 98%  96% 97%  Weight:  54.1 kg    Height:  4' 11 (1.499 m)       Physical Exam Vitals and nursing note reviewed.  Constitutional:      General: She is not in acute distress.    Appearance: She is ill-appearing (chronically). She is not toxic-appearing or diaphoretic.  HENT:     Head: Normocephalic and atraumatic.     Nose: Nose normal.     Comments: Nasal cannula present    Mouth/Throat:     Mouth: Mucous membranes are moist.     Pharynx: Oropharynx is clear.  Eyes:     General: No scleral icterus.    Extraocular Movements: Extraocular movements intact.  Cardiovascular:     Rate and Rhythm: Normal rate and regular rhythm.     Heart sounds: Normal heart sounds. No murmur heard.    No friction rub. No gallop.  Pulmonary:     Effort: No respiratory distress.     Breath sounds: No wheezing, rhonchi or rales.     Comments: Diminished bilaterally Abdominal:     General: Bowel sounds are normal. There is no distension.     Palpations: Abdomen is soft.     Tenderness: There is no abdominal tenderness. There is no guarding or rebound.  Musculoskeletal:     Cervical back: Neck supple.     Right lower leg: No edema.     Left lower leg: No edema.  Skin:    General: Skin is warm and dry.     Coloration: Skin is not jaundiced or pale.  Neurological:     General: No focal deficit present.     Mental Status: She is alert and oriented to person, place, and time. Mental status is  at baseline.  Psychiatric:        Mood and Affect: Mood normal.        Behavior: Behavior normal.        Thought Content: Thought content normal.        Judgment: Judgment normal.     Laboratory Data Recent Labs  Lab 03/31/24 1848 04/01/24 0038 04/01/24 0534  WBC 9.2  --   --   HGB 10.9* 10.5* 10.5*  HCT 36.5  --   --   PLT 283  --   --    Recent Labs  Lab 03/31/24 1848  NA 140  K 4.5  CL 101  CO2 28  BUN 19  CALCIUM  9.6  PROT 6.9  BILITOT 0.9  ALKPHOS 80  ALT 37  AST 39  GLUCOSE 243*   No results for input(s): INR in the last 168 hours.  No results for input(s): LIPASE in the last 72 hours.      Imaging Studies: DG Chest 1 View Result Date: 03/31/2024 CLINICAL DATA:  Shortness of breath EXAM: CHEST  1 VIEW COMPARISON:  03/19/2024 FINDINGS: Heart and mediastinal contours within normal limits. Consolidation noted in the right lower lobe and to a lesser extent left lower lobe compatible with pneumonia. Findings similar to prior study. No significant effusion or pneumothorax. No acute bony abnormality. IMPRESSION: Bilateral lower lobe airspace opacities, right greater than left compatible with pneumonia. No real change since prior study. Electronically Signed   By: Franky Crease M.D.   On: 03/31/2024 23:24    Assessment:   # Hematochezia - suspect diverticular bleeding vs anal outlet etiology - no abdominal pain or melena  # Chronic anemia - seems to be at baseline (10)- currently 10.5 today  # phx colon polyps  # Diverticulosis  # Int Hemorrhoids  Conditions impacting sedation and endoscopy # CAD and PAD on Plavix   # SSS s/p ppm  # H/o R lung cancer s/p systemic chemotherapy and radiation  # Chronic respiratory failure- o2 dependent- 3-4LNC  # COPD # HTN # HFpEF - 65% g1 dd # MGUS   Plan:  Given recent plavix  use (8/15)- will allow for 3 day washout prior to colonoscopy - tentatively Monday pending patient condition  Clear liquids  now NPO midnight going into Monday Golytely  to start tomorrow (8/17); ok to drink after npo order in effect. Additional prn gallon ordered if stools not clear after first gallon.  Labs morning of procedure- bmp, cbc, inr Plavix  and dvt ppx currently held Monitor H&H.  Transfusion and resuscitation as per primary team Avoid frequent lab draws to prevent lab induced anemia Supportive care and antiemetics as per primary team Maintain two sites IV access Avoid nsaids Monitor for GIB.  Colonoscopy with possible biopsy, control of bleeding, polypectomy, and interventions as necessary has been discussed with the patient/patient representative. Informed consent was obtained from the patient/patient representative after explaining the indication, nature, and risks of the procedure including but not limited to death, bleeding, perforation, missed neoplasm/lesions, cardiorespiratory compromise, and reaction to medications. Opportunity for questions was given and appropriate answers were provided. Patient/patient representative has verbalized understanding is amenable to undergoing the procedure.  Management of other medical comorbidities as per primary team  I personally performed the service.  Thank you for allowing us  to participate in this patient's care. Please don't hesitate to call if any questions or concerns arise.   Elspeth Ozell Jungling, DO [facility]  Portions of the record may have been created with voice recognition software. Occasional wrong-word or 'sound-a-like' substitutions may have occurred due to the inherent limitations of voice recognition software.  Read the chart carefully and recognize, using context, where substitutions may have occurred.

## 2024-04-01 NOTE — Assessment & Plan Note (Signed)
 No acute issues suspected

## 2024-04-02 DIAGNOSIS — K921 Melena: Secondary | ICD-10-CM | POA: Diagnosis not present

## 2024-04-02 LAB — CBC
HCT: 30.5 % — ABNORMAL LOW (ref 36.0–46.0)
Hemoglobin: 9.3 g/dL — ABNORMAL LOW (ref 12.0–15.0)
MCH: 28.2 pg (ref 26.0–34.0)
MCHC: 30.5 g/dL (ref 30.0–36.0)
MCV: 92.4 fL (ref 80.0–100.0)
Platelets: 228 K/uL (ref 150–400)
RBC: 3.3 MIL/uL — ABNORMAL LOW (ref 3.87–5.11)
RDW: 14.7 % (ref 11.5–15.5)
WBC: 7.9 K/uL (ref 4.0–10.5)
nRBC: 0 % (ref 0.0–0.2)

## 2024-04-02 MED ORDER — PEG 3350-KCL-NA BICARB-NACL 420 G PO SOLR
4000.0000 mL | Freq: Once | ORAL | Status: AC
  Administered 2024-04-02: 4000 mL via ORAL
  Filled 2024-04-02: qty 4000

## 2024-04-02 MED ORDER — BISACODYL 5 MG PO TBEC
10.0000 mg | DELAYED_RELEASE_TABLET | Freq: Once | ORAL | Status: AC
  Administered 2024-04-02: 10 mg via ORAL
  Filled 2024-04-02: qty 2

## 2024-04-02 MED ORDER — PEG 3350-KCL-NA BICARB-NACL 420 G PO SOLR: 2000.0000 mL | Freq: Once | ORAL | Status: DC | PRN

## 2024-04-02 NOTE — Progress Notes (Signed)
 PROGRESS NOTE  Angela Jensen    DOB: [birth year], 68 y.o.  FMW:[id number]    Code Status: Full Code   DOA: 03/31/2024   LOS: 0   Brief hospital course  Angela Jensen is a 68 y.o. female with a PMH significant for COPD on 4 L home O2, right lung cancer status post radiation therapy, hx pneumothorax, SSS s/p pacemaker, HFpEF(50%), HTN, DM, PAD with history of recent hospitalization (8/3-8 03/24/2024) for respiratory failure secondary to CAP/COPD associated with hemoptysis(treated with TXA), now being admitted for hematochezia as well as concern for HCAP and COPD exacerbation. In the ED initial vitals within normal limits, saturating at 91 on 3 L (baseline 3-4L) Labs, including troponin, BNP, CBC, CMP were all unremarkable except for hemoglobin of 10.9(baseline 9.5) and elevated blood glucose of 243. Digital rectal exam in ED with maroon-colored stool EKG showed possible Mobitz type 1 at 76 without acute ST-T wave changes(has pacemaker). Chest x-ray, unofficial read showing no change from prior pneumonia from a week ago. Patient was treated with DuoNebs, Solu-Medrol  and started on Zosyn  and vancomycin    04/02/24 -No BM this am. No abdominal pain.   Assessment & Plan  Principal Problem:   Hematochezia Active Problems:   COPD with acute exacerbation (HCC)   Cancer of lower lobe of right lung (HCC)   Chronic heart failure with preserved ejection fraction (HFpEF) (HCC)   SSS (sick sinus syndrome) s/p leadless pacemaker [facility])   Atherosclerosis of native arteries of extremity with intermittent claudication (HCC)   Chronic respiratory failure with hypoxia (HCC)  Hematochezia Last colonoscopy December 2021 showed extensive diverticulosis and nonbleeding internal hemorrhoids, EGD showed erosive gastropathy Currently on DAPT due to PAD--> hold Plavix  GI consult- planning colonoscopy Monday. CLD today. Bowel prep - CBC am   COPD without acute exacerbation- she is at her baseline and denies  symptoms Recent HCAP 03/21/2024 Chronic respiratory failure with hypoxia DuoNebs Q6M as needed Received Zosyn  and vancomycin  for possible HCAP- discontinue  Procalcitonin less than 0.10 Continue supplemental O2   SSS (sick sinus syndrome) s/p leadless pacemaker (HCC) No acute issues suspected   Chronic diastolic CHF - Last echo 01/2024: EF 60 to 65%, grade 1 diastolic dysfunction -Clinically euvolemic - Continue home meds   History of lung cancer, right lower lobe History of recurrent right pneumothorax - No evidence of pneumothorax on this admission - Follows outpatient with oncology   Hypertension - Continue home meds, monitor closely.  Titrate as needed   Type 2 diabetes without complication -Sliding scale insulin  coverage   PAD - Continue aspirin , hold Plavix  due to bleeding  Body mass index is 24.09 kg/m.  VTE ppx: SCDs Start: 03/31/24 2359  Diet:     Diet   Diet clear liquid Fluid consistency: Thin   Consultants: GI  Subjective 04/02/24    Pt reports feeling well this am.  No BM this am and no more observed bleeding. Tolerating her CLD. No abdominal pain   Objective  Blood pressure (!) 139/92, pulse 77, temperature (!) 97.5 F (36.4 C), resp. rate 16, height 4' 11 (1.499 m), weight 54.1 kg, SpO2 97%.  Intake/Output Summary (Last 24 hours) at 04/02/2024 0742 Last data filed at 04/01/2024 2114 Gross per 24 hour  Intake 630 ml  Output --  Net 630 ml   Filed Weights   03/31/24 1846 04/01/24 0025  Weight: 54 kg 54.1 kg    Physical Exam:  General: awake, alert, NAD HEENT: atraumatic, clear conjunctiva, anicteric sclera,  MMM, hearing grossly normal Respiratory: normal respiratory effort. Cardiovascular: quick capillary refill, normal S1/S2, RRR, no JVD, murmurs Gastrointestinal: soft, NT, ND Nervous: A&O x3. no gross focal neurologic deficits, normal speech Extremities: moves all equally, no edema, normal tone Skin: dry, intact, normal temperature,  normal color. No rashes, lesions or ulcers on exposed skin Psychiatry: normal mood, congruent affect  Labs   I have personally reviewed the following labs and imaging studies CBC    Component Value Date/Time   WBC 7.9 04/02/2024 0630   RBC 3.30 (L) 04/02/2024 0630   HGB 9.3 (L) 04/02/2024 0630   HGB 12.7 10/08/2023 1004   HGB 11.6 10/04/2023 1047   HCT 30.5 (L) 04/02/2024 0630   HCT 37.6 10/04/2023 1047   PLT 228 04/02/2024 0630   PLT 264 10/08/2023 1004   PLT 236 10/04/2023 1047   MCV 92.4 04/02/2024 0630   MCV 88 10/04/2023 1047   MCV 90 04/17/2014 1055   MCH 28.2 04/02/2024 0630   MCHC 30.5 04/02/2024 0630   RDW 14.7 04/02/2024 0630   RDW 12.9 10/04/2023 1047   RDW 13.6 04/17/2014 1055   LYMPHSABS 2.7 03/19/2024 2242   LYMPHSABS 0.9 10/04/2023 1047   LYMPHSABS 1.1 04/17/2014 1055   MONOABS 0.7 03/19/2024 2242   MONOABS 0.3 04/17/2014 1055   EOSABS 0.0 03/19/2024 2242   EOSABS 0.1 10/04/2023 1047   EOSABS 0.2 04/17/2014 1055   BASOSABS 0.0 03/19/2024 2242   BASOSABS 0.0 10/04/2023 1047   BASOSABS 0.1 04/17/2014 1055      Latest Ref Rng & Units 03/31/2024    6:48 PM 03/24/2024    4:02 AM 03/22/2024    4:36 AM  BMP  Glucose 70 - 99 mg/dL 756  837  83   BUN 8 - 23 mg/dL 19  26  16    Creatinine 0.44 - 1.00 mg/dL 9.21  9.43  9.42   Sodium 135 - 145 mmol/L 140  143  142   Potassium 3.5 - 5.1 mmol/L 4.5  3.7  3.8   Chloride 98 - 111 mmol/L 101  99  105   CO2 22 - 32 mmol/L 28  36  32   Calcium  8.9 - 10.3 mg/dL 9.6  9.0  8.4     DG Chest 1 View Result Date: 03/31/2024 CLINICAL DATA:  Shortness of breath EXAM: CHEST  1 VIEW COMPARISON:  03/19/2024 FINDINGS: Heart and mediastinal contours within normal limits. Consolidation noted in the right lower lobe and to a lesser extent left lower lobe compatible with pneumonia. Findings similar to prior study. No significant effusion or pneumothorax. No acute bony abnormality. IMPRESSION: Bilateral lower lobe airspace opacities, right  greater than left compatible with pneumonia. No real change since prior study. Electronically Signed   By: Franky Crease M.D.   On: 03/31/2024 23:24    Disposition Plan & Communication  Patient status: Observation  Admitted From: Home Planned disposition location: Home Anticipated discharge date: 8/18 pending colonoscopy   Family Communication: none at bedside    Author: Marien LITTIE Piety, DO [facility] 04/02/2024, 7:42 AM   Available by Epic secure chat 7AM-7PM. If 7PM-7AM, please contact night-coverage.  [facility] contact information found on [email or web address].

## 2024-04-02 NOTE — Plan of Care (Signed)

## 2024-04-02 NOTE — Plan of Care (Signed)
 Pt just started bowel prep and understands she is NPO after midnight for Colonoscopy tomorrow.

## 2024-04-02 NOTE — Progress Notes (Signed)
 Inpatient Follow-up/Progress Note   [name] is a 68 y.o. female.  Overnight Events / Subjective Findings NAEON. Daughter at bedside No further bloody bm. Hgb slightly less today than yesterday O2 at baseline, no sob/chest pain Plan for colonoscopy tmrw  Review of Systems  Constitutional:  Negative for activity change, appetite change, chills, diaphoresis, fatigue, fever and unexpected weight change.  HENT:  Negative for trouble swallowing and voice change.   Respiratory:  Negative for shortness of breath and wheezing.   Cardiovascular:  Negative for chest pain, palpitations and leg swelling.  Gastrointestinal:  Positive for blood in stool. Negative for abdominal distention, abdominal pain, anal bleeding, constipation, diarrhea, nausea, rectal pain and vomiting.  Skin:  Negative for color change and pallor.  Neurological:  Negative for dizziness, syncope and weakness.  Psychiatric/Behavioral:  Negative for confusion.   All other systems reviewed and are negative.    Medications  Current Facility-Administered Medications:    acetaminophen  (TYLENOL ) tablet 650 mg, 650 mg, Oral, Q6H PRN, 650 mg at 04/01/24 1509 **OR** acetaminophen  (TYLENOL ) suppository 650 mg, 650 mg, Rectal, Q6H PRN, Cleatus Delayne GAILS, MD   albuterol  (PROVENTIL ) (2.5 MG/3ML) 0.083% nebulizer solution 2.5 mg, 2.5 mg, Nebulization, Q2H PRN, Cleatus Delayne GAILS, MD   ALPRAZolam  (XANAX ) tablet 0.5 mg, 0.5 mg, Oral, BID PRN, Duncan, Hazel V, MD, 0.5 mg at 04/01/24 8177   atorvastatin  (LIPITOR) tablet 10 mg, 10 mg, Oral, Daily, Cleatus Delayne V, MD, 10 mg at 04/01/24 9161   cyclobenzaprine  (FLEXERIL ) tablet 10 mg, 10 mg, Oral, QHS PRN, Cleatus Delayne GAILS, MD   escitalopram  (LEXAPRO ) tablet 10 mg, 10 mg, Oral, Daily, Cleatus Delayne V, MD, 10 mg at 04/01/24 9161   guaiFENesin  (MUCINEX ) 12 hr tablet 600 mg, 600 mg, Oral, BID, Cleatus Delayne V, MD, 600 mg at 04/01/24 2118   HYDROcodone -acetaminophen  (NORCO/VICODIN) 5-325  MG per tablet 1-2 tablet, 1-2 tablet, Oral, Q4H PRN, Cleatus Delayne GAILS, MD, 2 tablet at 04/01/24 2118   ipratropium-albuterol  (DUONEB) 0.5-2.5 (3) MG/3ML nebulizer solution 3 mL, 3 mL, Nebulization, TID, Lenon Pons L, MD, 3 mL at 04/02/24 9286   losartan  (COZAAR ) tablet 12.5 mg, 12.5 mg, Oral, Daily, Cleatus Delayne V, MD, 12.5 mg at 04/01/24 9162   morphine  (PF) 2 MG/ML injection 2 mg, 2 mg, Intravenous, Q2H PRN, Cleatus Delayne GAILS, MD   nicotine  (NICODERM CQ  - dosed in mg/24 hours) patch 14 mg, 14 mg, Transdermal, Daily, Cleatus Delayne GAILS, MD   ondansetron  (ZOFRAN ) tablet 4 mg, 4 mg, Oral, Q6H PRN, 4 mg at 04/02/24 0743 **OR** ondansetron  (ZOFRAN ) injection 4 mg, 4 mg, Intravenous, Q6H PRN, Cleatus Delayne GAILS, MD   pantoprazole  (PROTONIX ) injection 40 mg, 40 mg, Intravenous, Q24H, Lenon Pons L, MD, 40 mg at 04/01/24 1616   predniSONE  (DELTASONE ) tablet 10 mg, 10 mg, Oral, Q breakfast, Lenon Pons L, MD, 10 mg at 04/01/24 9162   acetaminophen  **OR** acetaminophen , albuterol , ALPRAZolam , cyclobenzaprine , HYDROcodone -acetaminophen , morphine  injection, ondansetron  **OR** ondansetron  (ZOFRAN ) IV   Objective    Vitals:   04/01/24 2007 04/02/24 0007 04/02/24 0405 04/02/24 0715  BP: (!) 133/91 (!) 141/83 118/72   Pulse: 67 79 79   Resp:      Temp: 98 F (36.7 C) 97.9 F (36.6 C) 98.1 F (36.7 C)   TempSrc: Oral Oral Oral   SpO2: 94% 97% 97% 96%  Weight:      Height:         Physical Exam Vitals and nursing note reviewed.  Constitutional:      General: She is not in acute distress.    Appearance: She is ill-appearing. She is not toxic-appearing or diaphoretic.  HENT:     Head: Normocephalic and atraumatic.     Nose: Nose normal.     Comments: Nasal cannula 3L    Mouth/Throat:     Mouth: Mucous membranes are moist.     Pharynx: Oropharynx is clear.  Eyes:     General: No scleral icterus.    Extraocular Movements: Extraocular movements intact.  Cardiovascular:     Rate  and Rhythm: Normal rate and regular rhythm.     Heart sounds: Normal heart sounds. No murmur heard.    No friction rub. No gallop.  Pulmonary:     Effort: No respiratory distress.     Breath sounds: No wheezing, rhonchi or rales.     Comments: Diminished bilaterally Abdominal:     General: Bowel sounds are normal. There is no distension.     Palpations: Abdomen is soft.     Tenderness: There is no abdominal tenderness. There is no guarding or rebound.  Musculoskeletal:     Cervical back: Neck supple.     Right lower leg: No edema.     Left lower leg: No edema.  Skin:    General: Skin is warm and dry.     Coloration: Skin is not jaundiced or pale.  Neurological:     General: No focal deficit present.     Mental Status: She is alert and oriented to person, place, and time. Mental status is at baseline.  Psychiatric:        Mood and Affect: Mood normal.        Behavior: Behavior normal.        Thought Content: Thought content normal.        Judgment: Judgment normal.      Laboratory Data Recent Labs  Lab 03/31/24 1848 04/01/24 0038 04/01/24 0534 04/02/24 0630  WBC 9.2  --   --  7.9  HGB 10.9* 10.5* 10.5* 9.3*  HCT 36.5  --   --  30.5*  PLT 283  --   --  228   Recent Labs  Lab 03/31/24 1848  NA 140  K 4.5  CL 101  CO2 28  BUN 19  CREATININE 0.78  CALCIUM  9.6  PROT 6.9  BILITOT 0.9  ALKPHOS 80  ALT 37  AST 39  GLUCOSE 243*   No results for input(s): INR in the last 168 hours.    Imaging Studies: DG Chest 1 View Result Date: 03/31/2024 CLINICAL DATA:  Shortness of breath EXAM: CHEST  1 VIEW COMPARISON:  03/19/2024 FINDINGS: Heart and mediastinal contours within normal limits. Consolidation noted in the right lower lobe and to a lesser extent left lower lobe compatible with pneumonia. Findings similar to prior study. No significant effusion or pneumothorax. No acute bony abnormality. IMPRESSION: Bilateral lower lobe airspace opacities, right greater than  left compatible with pneumonia. No real change since prior study. Electronically Signed   By: Franky Crease M.D.   On: 03/31/2024 23:24    Assessment:   # Hematochezia - suspect diverticular bleeding vs anal outlet etiology - no abdominal pain or melena - hgb down 1 gram today (8/17)   # Chronic anemia - seems to be at baseline (10)- currently 9.3 today   # phx colon polyps   # Diverticulosis   # Int Hemorrhoids   Conditions impacting sedation and endoscopy #  CAD and PAD on Plavix    # SSS s/p ppm   # H/o R lung cancer s/p systemic chemotherapy and radiation   # Chronic respiratory failure- o2 dependent- 3-4LNC   # COPD # HTN # HFpEF - 65% g1 dd # MGUS    Plan:  Colonoscopy planned for tomorrow pending patient stability and endoscopy suite availability Monday morning will be 3 day washout from plavix  (last dose 8/15) Clear liquids now NPO at midnight- ok to drink prep after midnight Golytely  to start today (8/17); ok to drink after npo order in effect. Additional prn gallon ordered if stools not clear after first gallon.   Labs morning of procedure- bmp, cbc, inr Plavix  and dvt ppx currently held Monitor H&H.  Transfusion and resuscitation as per primary team Avoid frequent lab draws to prevent lab induced anemia Supportive care and antiemetics as per primary team Maintain two sites IV access Avoid nsaids Monitor for GIB.   Colonoscopy with possible biopsy, control of bleeding, polypectomy, and interventions as necessary has been discussed with the patient/patient representative. Informed consent was obtained from the patient/patient representative after explaining the indication, nature, and risks of the procedure including but not limited to death, bleeding, perforation, missed neoplasm/lesions, cardiorespiratory compromise, and reaction to medications. Opportunity for questions was given and appropriate answers were provided. Patient/patient representative has  verbalized understanding is amenable to undergoing the procedure.  Management of other medical comorbidities as per primary team  I personally performed the service.  Thank you for allowing us  to participate in this patient's care. Please don't hesitate to call if any questions or concerns arise.   Elspeth Ozell Jungling, DO [facility]  Portions of the record may have been created with voice recognition software. Occasional wrong-word or 'sound-a-like' substitutions may have occurred due to the inherent limitations of voice recognition software.  Read the chart carefully and recognize, using context, where substitutions may have occurred.

## 2024-04-03 ENCOUNTER — Encounter: Payer: Self-pay | Admitting: Internal Medicine

## 2024-04-03 ENCOUNTER — Observation Stay: Admitting: Anesthesiology

## 2024-04-03 ENCOUNTER — Encounter
Admission: EM | Disposition: A | Discharge status: Home / Self Care | Payer: Self-pay | Source: Home / Self Care | Attending: Emergency Medicine

## 2024-04-03 DIAGNOSIS — K921 Melena: Secondary | ICD-10-CM | POA: Diagnosis not present

## 2024-04-03 DIAGNOSIS — D122 Benign neoplasm of ascending colon: Secondary | ICD-10-CM

## 2024-04-03 DIAGNOSIS — K649 Unspecified hemorrhoids: Secondary | ICD-10-CM | POA: Diagnosis not present

## 2024-04-03 DIAGNOSIS — K641 Second degree hemorrhoids: Secondary | ICD-10-CM

## 2024-04-03 DIAGNOSIS — K5730 Diverticulosis of large intestine without perforation or abscess without bleeding: Secondary | ICD-10-CM

## 2024-04-03 DIAGNOSIS — I110 Hypertensive heart disease with heart failure: Secondary | ICD-10-CM | POA: Diagnosis not present

## 2024-04-03 DIAGNOSIS — K635 Polyp of colon: Secondary | ICD-10-CM

## 2024-04-03 DIAGNOSIS — Z87891 Personal history of nicotine dependence: Secondary | ICD-10-CM | POA: Diagnosis not present

## 2024-04-03 DIAGNOSIS — I5023 Acute on chronic systolic (congestive) heart failure: Secondary | ICD-10-CM | POA: Diagnosis not present

## 2024-04-03 HISTORY — PX: COLONOSCOPY: SHX5424

## 2024-04-03 HISTORY — PX: POLYPECTOMY: SHX149

## 2024-04-03 LAB — GLUCOSE, CAPILLARY: Glucose-Capillary: 124 mg/dL — ABNORMAL HIGH (ref 70–99)

## 2024-04-03 LAB — CBC
HCT: 29.9 % — ABNORMAL LOW (ref 36.0–46.0)
Hemoglobin: 9.1 g/dL — ABNORMAL LOW (ref 12.0–15.0)
MCH: 28.6 pg (ref 26.0–34.0)
MCHC: 30.4 g/dL (ref 30.0–36.0)
MCV: 94 fL (ref 80.0–100.0)
Platelets: 224 K/uL (ref 150–400)
RBC: 3.18 MIL/uL — ABNORMAL LOW (ref 3.87–5.11)
RDW: 14.7 % (ref 11.5–15.5)
WBC: 6.1 K/uL (ref 4.0–10.5)
nRBC: 0 % (ref 0.0–0.2)

## 2024-04-03 SURGERY — COLONOSCOPY
Anesthesia: General

## 2024-04-03 MED ORDER — PROPOFOL 1000 MG/100ML IV EMUL
INTRAVENOUS | Status: AC
  Filled 2024-04-03: qty 100

## 2024-04-03 MED ORDER — ALBUTEROL SULFATE HFA 108 (90 BASE) MCG/ACT IN AERS
INHALATION_SPRAY | RESPIRATORY_TRACT | Status: AC
  Filled 2024-04-03: qty 6.7

## 2024-04-03 MED ORDER — PROPOFOL 500 MG/50ML IV EMUL
INTRAVENOUS | Status: DC | PRN
Start: 2024-04-03 — End: 2024-04-03
  Administered 2024-04-03: 75 ug/kg/min via INTRAVENOUS

## 2024-04-03 MED ORDER — SODIUM CHLORIDE 0.9 % IV SOLN: INTRAVENOUS | Status: DC

## 2024-04-03 MED ORDER — PHENYLEPHRINE 80 MCG/ML (10ML) SYRINGE FOR IV PUSH (FOR BLOOD PRESSURE SUPPORT)
PREFILLED_SYRINGE | INTRAVENOUS | Status: DC | PRN
  Administered 2024-04-03: 80 ug via INTRAVENOUS

## 2024-04-03 MED ORDER — ALBUTEROL SULFATE HFA 108 (90 BASE) MCG/ACT IN AERS
INHALATION_SPRAY | RESPIRATORY_TRACT | Status: DC | PRN
  Administered 2024-04-03: 2 via RESPIRATORY_TRACT

## 2024-04-03 MED ORDER — LIDOCAINE HCL (CARDIAC) PF 100 MG/5ML IV SOSY
PREFILLED_SYRINGE | INTRAVENOUS | Status: DC | PRN
  Administered 2024-04-03: 50 mg via INTRAVENOUS

## 2024-04-03 MED ORDER — IPRATROPIUM-ALBUTEROL 0.5-2.5 (3) MG/3ML IN SOLN: 3.0000 mL | Freq: Two times a day (BID) | RESPIRATORY_TRACT | Status: DC

## 2024-04-03 MED ORDER — PROPOFOL 10 MG/ML IV BOLUS
INTRAVENOUS | Status: DC | PRN
  Administered 2024-04-03: 40 mg via INTRAVENOUS
  Administered 2024-04-03 (×4): 20 mg via INTRAVENOUS

## 2024-04-03 MED ORDER — PANTOPRAZOLE SODIUM 40 MG PO TBEC: 40.0000 mg | DELAYED_RELEASE_TABLET | Freq: Every day | ORAL | 0 refills | Status: DC

## 2024-04-03 NOTE — Care Management Obs Status (Signed)
 MEDICARE OBSERVATION STATUS NOTIFICATION   Patient Details  Name: BERNADETT MILIAN MRN: [id number] Date of Birth: 16-Feb-1956   Medicare Observation Status Notification Given:  No (patient has not been on the floor all morning, spoke with daughter Marjorie Mulch by phone.)    [name] 04/03/2024, 1:44 PM

## 2024-04-03 NOTE — Progress Notes (Incomplete)
 PROGRESS NOTE  Angela Jensen    DOB: 1956/02/15, 68 y.o.  FMW:[id number]    Code Status: Full Code   DOA: 03/31/2024   LOS: 0   Brief hospital course  Angela Jensen is a 68 y.o. female with a PMH significant for COPD on 4 L home O2, right lung cancer status post radiation therapy, hx pneumothorax, SSS s/p pacemaker, HFpEF(50%), HTN, DM, PAD with history of recent hospitalization (8/3-8 03/24/2024) for respiratory failure secondary to CAP/COPD associated with hemoptysis(treated with TXA), now being admitted for hematochezia as well as concern for HCAP and COPD exacerbation. In the ED initial vitals within normal limits, saturating at 91 on 3 L (baseline 3-4L) Labs, including troponin, BNP, CBC, CMP were all unremarkable except for hemoglobin of 10.9(baseline 9.5) and elevated blood glucose of 243. Digital rectal exam in ED with maroon-colored stool EKG showed possible Mobitz type 1 at 76 without acute ST-T wave changes(has pacemaker). Chest x-ray, unofficial read showing no change from prior pneumonia from a week ago. Patient was treated with DuoNebs, Solu-Medrol  and started on Zosyn  and vancomycin    04/03/24 -No BM this am. No abdominal pain.   Assessment & Plan  Principal Problem:   Hematochezia Active Problems:   COPD with acute exacerbation (HCC)   Cancer of lower lobe of right lung (HCC)   Chronic heart failure with preserved ejection fraction (HFpEF) (HCC)   SSS (sick sinus syndrome) s/p leadless pacemaker [facility])   Atherosclerosis of native arteries of extremity with intermittent claudication (HCC)   Chronic respiratory failure with hypoxia (HCC)  Hematochezia Last colonoscopy December 2021 showed extensive diverticulosis and nonbleeding internal hemorrhoids, EGD showed erosive gastropathy Currently on DAPT due to PAD--> hold Plavix  GI consult- planning colonoscopy Monday. CLD today. Bowel prep - CBC am   COPD without acute exacerbation- she is at her baseline and denies  symptoms Recent HCAP 03/21/2024 Chronic respiratory failure with hypoxia DuoNebs Q6M as needed Received Zosyn  and vancomycin  for possible HCAP- discontinue  Procalcitonin less than 0.10 Continue supplemental O2   SSS (sick sinus syndrome) s/p leadless pacemaker (HCC) No acute issues suspected   Chronic diastolic CHF - Last echo 01/2024: EF 60 to 65%, grade 1 diastolic dysfunction -Clinically euvolemic - Continue home meds   History of lung cancer, right lower lobe History of recurrent right pneumothorax - No evidence of pneumothorax on this admission - Follows outpatient with oncology   Hypertension - Continue home meds, monitor closely.  Titrate as needed   Type 2 diabetes without complication -Sliding scale insulin  coverage   PAD - Continue aspirin , hold Plavix  due to bleeding  Body mass index is 24.09 kg/m.  VTE ppx: SCDs Start: 03/31/24 2359  Diet:     Diet   Diet NPO time specified   Consultants: GI  Subjective 04/03/24    Pt reports feeling well this am.  No BM this am and no more observed bleeding. Tolerating her CLD. No abdominal pain   Objective  Blood pressure (!) 139/92, pulse 77, temperature (!) 97.5 F (36.4 C), resp. rate 16, height 4' 11 (1.499 m), weight 54.1 kg, SpO2 97%.  Intake/Output Summary (Last 24 hours) at 04/03/2024 0733 Last data filed at 04/02/2024 2153 Gross per 24 hour  Intake 2520 ml  Output --  Net 2520 ml   Filed Weights   03/31/24 1846 04/01/24 0025  Weight: 54 kg 54.1 kg    Physical Exam:  General: awake, alert, NAD HEENT: atraumatic, clear conjunctiva, anicteric sclera, MMM, hearing  grossly normal Respiratory: normal respiratory effort. Cardiovascular: quick capillary refill, normal S1/S2, RRR, no JVD, murmurs Gastrointestinal: soft, NT, ND Nervous: A&O x3. no gross focal neurologic deficits, normal speech Extremities: moves all equally, no edema, normal tone Skin: dry, intact, normal temperature, normal color. No  rashes, lesions or ulcers on exposed skin Psychiatry: normal mood, congruent affect  Labs   I have personally reviewed the following labs and imaging studies CBC    Component Value Date/Time   WBC 6.1 04/03/2024 0548   RBC 3.18 (L) 04/03/2024 0548   HGB 9.1 (L) 04/03/2024 0548   HGB 12.7 10/08/2023 1004   HGB 11.6 10/04/2023 1047   HCT 29.9 (L) 04/03/2024 0548   HCT 37.6 10/04/2023 1047   PLT 224 04/03/2024 0548   PLT 264 10/08/2023 1004   PLT 236 10/04/2023 1047   MCV 94.0 04/03/2024 0548   MCV 88 10/04/2023 1047   MCV 90 04/17/2014 1055   MCH 28.6 04/03/2024 0548   MCHC 30.4 04/03/2024 0548   RDW 14.7 04/03/2024 0548   RDW 12.9 10/04/2023 1047   RDW 13.6 04/17/2014 1055   LYMPHSABS 2.7 03/19/2024 2242   LYMPHSABS 0.9 10/04/2023 1047   LYMPHSABS 1.1 04/17/2014 1055   MONOABS 0.7 03/19/2024 2242   MONOABS 0.3 04/17/2014 1055   EOSABS 0.0 03/19/2024 2242   EOSABS 0.1 10/04/2023 1047   EOSABS 0.2 04/17/2014 1055   BASOSABS 0.0 03/19/2024 2242   BASOSABS 0.0 10/04/2023 1047   BASOSABS 0.1 04/17/2014 1055      Latest Ref Rng & Units 03/31/2024    6:48 PM 03/24/2024    4:02 AM 03/22/2024    4:36 AM  BMP  Glucose 70 - 99 mg/dL 756  837  83   BUN 8 - 23 mg/dL 19  26  16    Creatinine 0.44 - 1.00 mg/dL 9.21  9.43  9.42   Sodium 135 - 145 mmol/L 140  143  142   Potassium 3.5 - 5.1 mmol/L 4.5  3.7  3.8   Chloride 98 - 111 mmol/L 101  99  105   CO2 22 - 32 mmol/L 28  36  32   Calcium  8.9 - 10.3 mg/dL 9.6  9.0  8.4     No results found.   Disposition Plan & Communication  Patient status: Observation  Admitted From: Home Planned disposition location: Home Anticipated discharge date: 8/18 pending colonoscopy   Family Communication: none at bedside    Author: Marien LITTIE Piety, DO [facility] 04/03/2024, 7:33 AM   Available by Epic secure chat 7AM-7PM. If 7PM-7AM, please contact night-coverage.  [facility] contact information found on [email or web address].

## 2024-04-03 NOTE — Discharge Summary (Signed)
 Physician Discharge Summary  Patient: Angela Jensen FMW:[id number] DOB: 11-Feb-1956   Code Status: Full Code Admit date: 03/31/2024 Discharge date: 04/03/2024 Disposition: Home, No home health services recommended PCP: Liana Fish, NP  Recommendations for Outpatient Follow-up:  Follow up with PCP within 1-2 weeks Regarding general hospital follow up and preventative care Recommend CBC Follow up with GI for polyp biopsy results  Discharge Diagnoses:  Principal Problem:   Hematochezia Active Problems:   COPD with acute exacerbation (HCC)   Cancer of lower lobe of right lung (HCC)   Chronic heart failure with preserved ejection fraction (HFpEF) (HCC)   SSS (sick sinus syndrome) s/p leadless pacemaker [facility])   Atherosclerosis of native arteries of extremity with intermittent claudication (HCC)   Chronic respiratory failure with hypoxia (HCC)   Polyp of transverse colon  Brief Hospital Course Summary: Angela Jensen is a 68 y.o. female with a PMH significant for COPD on 4 L home O2, right lung cancer status post radiation therapy, hx pneumothorax, SSS s/p pacemaker, HFpEF(50%), HTN, DM, PAD with history of recent hospitalization (8/3-8 03/24/2024) for respiratory failure secondary to CAP/COPD associated with hemoptysis(treated with TXA), now being admitted for hematochezia as well as concern for HCAP and COPD exacerbation. In the ED initial vitals within normal limits, saturating at 91 on 3 L (baseline 3-4L) Labs, including troponin, BNP, CBC, CMP were all unremarkable except for hemoglobin of 10.9(baseline 9.5) and elevated blood glucose of 243. Digital rectal exam in ED with maroon-colored stool EKG showed possible Mobitz type 1 at 76 without acute ST-T wave changes(has pacemaker). Chest x-ray: shows evolving changes from recent pneumonia.  Patient was initially treated with DuoNebs, Solu-Medrol  and started on Zosyn  and vancomycin .  Her respiratory status remained stable at baseline  with procalcitonin 0.10- antibiotics were stopped.   For her hematochezia, GI was consulted and performed colonoscopy which did not show active bleeding but was positive for known diverticular disease and hemorrhoids as possible source of her bleeding. Her hgb remained stable (9.1 on day of dc) and did not show any further major bleeding. No transfusion was needed. Plavix  was held temporarily awaiting her colonoscopy and was restarted as she demonstrated stability and no active bleed after.  She was discharged in stable condition and will follow up with GI for the results from biopsies of polyps removed during colonoscopy.   All other chronic conditions were treated with home medications.    Discharge Condition: Good, improved Recommended discharge diet: Regular healthy diet  Consultations: GI  Procedures/Studies: Colonoscopy   Allergies as of 04/03/2024       Reactions   Iodinated Contrast Media Itching   Trelegy Ellipta  [fluticasone -umeclidin-vilant]    Had breathing issues   Bactoshield Chg [chlorhexidine  Gluconate] Itching, Other (See Comments)   Dry. Flaking, peeling skin        Medication List     STOP taking these medications    Robafen DM 20-200 MG/20ML Liqd Generic drug: Dextromethorphan -guaiFENesin        TAKE these medications    Accu-Chek Guide Test test strip Generic drug: glucose blood USE TO TEST BLOOD SUGAR 5 TIMES DAILY   Accu-Chek Guide w/Device Kit Use as directed Dx e11.65   Accu-Chek Softclix Lancets lancets Use 1 lancet 3 times daily to check glucose for diabetes   albuterol  (5 MG/ML) 0.5% nebulizer solution Commonly known as: PROVENTIL  Take 2.5 mg by nebulization every 6 (six) hours as needed for wheezing or shortness of breath.   albuterol  108 (90  Base) MCG/ACT inhaler Commonly known as: VENTOLIN  HFA INHALE 2 PUFFS BY MOUTH EVERY 6 HOURS AS NEEDED FOR WHEEZING OR SHORTNESS OF BREATH   ALPRAZolam  0.5 MG tablet Commonly known as:  XANAX  Take 1 tablet (0.5 mg total) by mouth 2 (two) times daily as needed for anxiety.   aspirin  EC 81 MG tablet Take 1 tablet (81 mg total) by mouth daily. Swallow whole.   atorvastatin  10 MG tablet Commonly known as: Lipitor Take 1 tablet (10 mg total) by mouth daily.   calcium  carbonate 1500 (600 Ca) MG Tabs tablet Commonly known as: OSCAL Take 600 mg of elemental calcium  by mouth 2 (two) times daily with a meal.   clopidogrel  75 MG tablet Commonly known as: PLAVIX  Take 1 tablet (75 mg total) by mouth daily.   cyclobenzaprine  10 MG tablet Commonly known as: FLEXERIL  Take 1 tablet (10 mg total) by mouth at bedtime. Take one tab po qhs for back spasm prn only What changed:  when to take this reasons to take this   dexamethasone  1 MG tablet Commonly known as: DECADRON  Take 1 mg by mouth.   Dulera  200-5 MCG/ACT Aero Generic drug: mometasone -formoterol  Inhale 2 puffs by mouth twice daily   escitalopram  10 MG tablet Commonly known as: LEXAPRO  Take 1 tablet (10 mg total) by mouth daily.   ferrous sulfate  325 (65 FE) MG tablet Take 325 mg by mouth 2 (two) times daily with a meal.   furosemide  20 MG tablet Commonly known as: LASIX  Take 1 tablet (20 mg total) by mouth daily as needed.   insulin  glargine (1 Unit Dial ) 300 UNIT/ML Solostar Pen Commonly known as: TOUJEO  Inject 10 Units into the skin daily.   ipratropium-albuterol  0.5-2.5 (3) MG/3ML Soln Commonly known as: DUONEB Take 3 mLs by nebulization every 6 (six) hours as needed.   losartan  25 MG tablet Commonly known as: COZAAR  Take 0.5 tablets (12.5 mg total) by mouth daily.   metFORMIN  500 MG 24 hr tablet Commonly known as: GLUCOPHAGE -XR TAKE 2 TABLETS BY MOUTH ONCE DAILY WITH BREAKFAST   metoprolol  succinate 50 MG 24 hr tablet Commonly known as: TOPROL -XL Take 1 tablet (50 mg total) by mouth daily. Take with or immediately following a meal.   oxyCODONE -acetaminophen  5-325 MG tablet Commonly known as:  PERCOCET/ROXICET Take 1 tablet by mouth every 6 (six) hours as needed for severe pain (pain score 7-10).   OXYGEN  Inhale 4 L into the lungs. PT USES [facility] FOR OXYGEN    pantoprazole  40 MG tablet Commonly known as: Protonix  Take 1 tablet (40 mg total) by mouth daily.   potassium chloride  10 MEQ tablet Commonly known as: KLOR-CON  TAKE 1 TABLET BY MOUTH EVERY OTHER DAY   pregabalin  75 MG capsule Commonly known as: Lyrica  Take 1 capsule (75 mg total) by mouth 2 (two) times daily.   ReliOn Pen Needles 31G X 6 MM Misc Generic drug: Insulin  Pen Needle USE 1 PEN NEEDLE WITH INSULIN  PEN ONCE DAILY FOR DIABETES   Spiriva  HandiHaler 18 MCG inhalation capsule Generic drug: tiotropium INHALE THE CONTENTS OF 1 CAPSULES BY MOUTH ONCE DAILY - DO NOT SWALLOW CAPSULES   sulfamethoxazole -trimethoprim  400-80 MG tablet Commonly known as: BACTRIM  Take by mouth.   vitamin B-12 100 MCG tablet Commonly known as: CYANOCOBALAMIN  Take 100 mcg by mouth daily.   VITAMIN D  (CHOLECALCIFEROL ) PO Take 1 tablet by mouth in the morning.        Follow-up Information     Liana Fish, NP Follow up.  Specialty: Nurse Practitioner Why: hospital follow up Contact information: [address] [place] [address] [phone]         Jinny Carmine, MD Follow up in 2 week(s).   Specialty: Gastroenterology Contact information: [address] [place]  [place] [address] [phone]                 Subjective   Pt reports feeling well. She denies SOB or chest pain. Denies any further bleeding in stool.   All questions and concerns were addressed at time of discharge.  Objective  Blood pressure 103/60, pulse 71, temperature (!) 97.2 F (36.2 C), temperature source Temporal, resp. rate 18, height 4' 11 (1.499 m), weight 54.1 kg, SpO2 95%.   General: Pt is alert, awake, not in acute distress Cardiovascular: RRR, S1/S2 +, no rubs, no gallops Respiratory: CTA bilaterally, no  wheezing, no rhonchi Abdominal: Soft, NT, ND, bowel sounds + Extremities: no edema, no cyanosis  The results of significant diagnostics from this hospitalization (including imaging, microbiology, ancillary and laboratory) are listed below for reference.   Imaging studies: DG Chest 1 View Result Date: 03/31/2024 CLINICAL DATA:  Shortness of breath EXAM: CHEST  1 VIEW COMPARISON:  03/19/2024 FINDINGS: Heart and mediastinal contours within normal limits. Consolidation noted in the right lower lobe and to a lesser extent left lower lobe compatible with pneumonia. Findings similar to prior study. No significant effusion or pneumothorax. No acute bony abnormality. IMPRESSION: Bilateral lower lobe airspace opacities, right greater than left compatible with pneumonia. No real change since prior study. Electronically Signed   By: Franky Crease M.D.   On: 03/31/2024 23:24   CT Angio Chest Pulmonary Embolism (PE) W or WO Contrast Result Date: 03/23/2024 EXAM: CTA of the Chest with contrast for PE 03/23/2024 07:47:00 AM TECHNIQUE: CTA of the chest was performed after the administration of intravenous contrast. Multiplanar reformatted images are provided for review. MIP images are provided for review. Automated exposure control, iterative reconstruction, and/or weight based adjustment of the mA/kV was utilized to reduce the radiation dose to as low as reasonably achievable. COMPARISON: CT pulmonary angiogram dated 01/28/2024. CLINICAL HISTORY: Dyspnea, chronic, chest wall or pleura disease suspected; Prior lung cancer, recurrent pneumothorax, known COPD, has been treated for pneumonia during this admission. Ongoing hemoptysis and dyspnea. Concern for malignancy vs PE. Allergic patient received 13-hour allergy prep; Hospital course: MABRY SANTARELLI is a 68 year old female with COPD on 4 L nasal cannula at baseline, right lung cancer status post radiation therapy, recurrent right pneumothorax x 2 most recently August 2024,  history of heart failure with preserved EF 50%, hypertension, diabetes, PAD, was admitted with acute respiratory failure requiring BiPAP. On EMS arrival to the home she was saturating in the 50s on room air and endorsed recent fever and cough. She was admitted for treatment of community-acquired pneumonia. FINDINGS: PULMONARY ARTERIES: Pulmonary arteries are adequately opacified for evaluation. No pulmonary embolism. MEDIASTINUM: The heart is borderline size. A leadless cardiac pacemaker is noted within the right ventricle. LYMPH NODES: No mediastinal, hilar or axillary lymphadenopathy. LUNGS AND PLEURA: There are patchy and coarse areas of opacification/consolidation within the right lower lobe. There is moderate central lobular emphysema. There is mild right-sided pleural effusion/pleural thickening. UPPER ABDOMEN: Limited images of the upper abdomen are unremarkable. SOFT TISSUES AND BONES: No acute bone or soft tissue abnormality. IMPRESSION: 1. No evidence of pulmonary embolism. 2. Patchy and coarse areas of opacification/consolidation within the right lower lobe, possibly related to recent pneumonia. 3. Mild  right-sided pleural effusion/pleural thickening. 4. Moderate central lobular emphysema. Electronically signed by: evalene coho 03/23/2024 08:14 AM EDT RP Workstation: [id number]   DG Chest Port 1 View Result Date: 03/19/2024 CLINICAL DATA:  Respiratory distress peer EXAM: PORTABLE CHEST 1 VIEW COMPARISON:  January 29, 2024 FINDINGS: The heart size and mediastinal contours are within normal limits. There is evidence of emphysematous lung disease. Mild left basilar and mild to moderate severity right basilar airspace disease is noted. There is a small right pleural effusion. No pneumothorax is identified. The visualized skeletal structures are unremarkable. IMPRESSION: 1. Mild left basilar and mild to moderate severity right basilar airspace disease. 2. Small right pleural effusion. Electronically Signed    By: Suzen Dials M.D.   On: 03/19/2024 23:16    Labs: Basic Metabolic Panel: Recent Labs  Lab 03/31/24 1848  NA 140  K 4.5  CL 101  CO2 28  GLUCOSE 243*  BUN 19  CREATININE 0.78  CALCIUM  9.6   CBC: Recent Labs  Lab 03/31/24 1848 04/01/24 0038 04/01/24 0534 04/02/24 0630 04/03/24 0548  WBC 9.2  --   --  7.9 6.1  HGB 10.9* 10.5* 10.5* 9.3* 9.1*  HCT 36.5  --   --  30.5* 29.9*  MCV 94.3  --   --  92.4 94.0  PLT 283  --   --  228 224   Microbiology: Results for orders placed or performed during the hospital encounter of 03/31/24  Resp panel by RT-PCR (RSV, Flu A&B, Covid) Anterior Nasal Swab     Status: None   Collection Time: 03/31/24 11:48 PM   Specimen: Anterior Nasal Swab  Result Value Ref Range Status   SARS Coronavirus 2 by RT PCR NEGATIVE NEGATIVE Final    Comment: (NOTE) SARS-CoV-2 target nucleic acids are NOT DETECTED.  The SARS-CoV-2 RNA is generally detectable in upper respiratory specimens during the acute phase of infection. The lowest concentration of SARS-CoV-2 viral copies this assay can detect is 138 copies/mL. A negative result does not preclude SARS-Cov-2 infection and should not be used as the sole basis for treatment or other patient management decisions. A negative result may occur with  improper specimen collection/handling, submission of specimen other than nasopharyngeal swab, presence of viral mutation(s) within the areas targeted by this assay, and inadequate number of viral copies(<138 copies/mL). A negative result must be combined with clinical observations, patient history, and epidemiological information. The expected result is Negative.  Fact Sheet for Patients:  BloggerCourse.com  Fact Sheet for Healthcare Providers:  SeriousBroker.it  This test is no t yet approved or cleared by the [place]  FDA and  has been authorized for detection and/or diagnosis of SARS-CoV-2  by FDA under an Emergency Use Authorization (EUA). This EUA will remain  in effect (meaning this test can be used) for the duration of the COVID-19 declaration under Section 564(b)(1) of the Act, 21 U.S.C.section 360bbb-3(b)(1), unless the authorization is terminated  or revoked sooner.       Influenza A by PCR NEGATIVE NEGATIVE Final   Influenza B by PCR NEGATIVE NEGATIVE Final    Comment: (NOTE) The Xpert Xpress SARS-CoV-2/FLU/RSV plus assay is intended as an aid in the diagnosis of influenza from Nasopharyngeal swab specimens and should not be used as a sole basis for treatment. Nasal washings and aspirates are unacceptable for Xpert Xpress SARS-CoV-2/FLU/RSV testing.  Fact Sheet for Patients: BloggerCourse.com  Fact Sheet for Healthcare Providers: SeriousBroker.it  This test is not yet approved or cleared by the  [place]  FDA and has been authorized for detection and/or diagnosis of SARS-CoV-2 by FDA under an Emergency Use Authorization (EUA). This EUA will remain in effect (meaning this test can be used) for the duration of the COVID-19 declaration under Section 564(b)(1) of the Act, 21 U.S.C. section 360bbb-3(b)(1), unless the authorization is terminated or revoked.     Resp Syncytial Virus by PCR NEGATIVE NEGATIVE Final    Comment: (NOTE) Fact Sheet for Patients: BloggerCourse.com  Fact Sheet for Healthcare Providers: SeriousBroker.it  This test is not yet approved or cleared by the [place]  FDA and has been authorized for detection and/or diagnosis of SARS-CoV-2 by FDA under an Emergency Use Authorization (EUA). This EUA will remain in effect (meaning this test can be used) for the duration of the COVID-19 declaration under Section 564(b)(1) of the Act, 21 U.S.C. section 360bbb-3(b)(1), unless the authorization is terminated or revoked.  Performed at  [facility], [address] [place], [place] [address]    *Note: Due to a large number of results and/or encounters for the requested time period, some results have not been displayed. A complete set of results can be found in Results Review.    Time coordinating discharge: Over 30 minutes  Marien LITTIE Piety, MD  [facility] 04/03/2024, 1:56 PM

## 2024-04-03 NOTE — Anesthesia Preprocedure Evaluation (Signed)
 Anesthesia Evaluation  Patient identified by MRN, date of birth, ID band Patient awake    Reviewed: Allergy & Precautions, NPO status , Patient's Chart, lab work & pertinent test results  History of Anesthesia Complications Negative for: history of anesthetic complications  Airway Mallampati: III   Neck ROM: Full    Dental  (+) Edentulous Upper, Edentulous Lower, Dental Advidsory Given   Pulmonary shortness of breath, asthma , sleep apnea , pneumonia, resolved, COPD (on 2-3L home O2), neg recent URI, former smoker Lung CA   Pulmonary exam normal breath sounds clear to auscultation       Cardiovascular hypertension, + Peripheral Vascular Disease and +CHF (preserved EF)  (-) Past MI and (-) Cardiac Stents Normal cardiovascular exam(-) dysrhythmias + pacemaker (SSS) (-) Valvular Problems/Murmurs Rhythm:Regular Rate:Normal     Neuro/Psych  PSYCHIATRIC DISORDERS Anxiety Depression    negative neurological ROS     GI/Hepatic ,GERD  ,,  Endo/Other  diabetes, Type 2    Renal/GU negative Renal ROS     Musculoskeletal  (+) Arthritis ,    Abdominal   Peds  Hematology  (+) Blood dyscrasia, anemia   Anesthesia Other Findings Cardiology note 01/20/24:  Angela Jensen is a 68 y.o. female  with a past medical history of chronic HFpEF, SSS s/p micra (06/2021), hypertension, hyperlipidemia, aortic atherosclerosis, IDDM, PVD, COPD/asthma (baseline 2L O2), OSA, tobacco use who presented for outpatient procedure on 01/17/2024 underwent lower extremity angiogram on 06/02 and found that patient will need a left femoral endarterectomy with illiac stents and SFA intervention. Cardiology was consulted for preoperative cardiac evaluation.   #Preop Cardiac Evaluation # PAD with Left common femoral artery stenosis # Hypertension # Hyperlipidemia # Chronic HFpEF # SSS s/p micra (06/2021) Patient with PAD with rest pain LLE underwent left lower  extremity angiogram that demonstrated 70% stenosis of left common femoral artery on 06/02. Patient requiring left femoral endarterectomy with illiac stents and SFA intervention. Echo from 05/2023 with pEF and low risk. EKG from today (06/03) with sinus rhythm, RBBB is unchanged from prior EKG, no acute ischemic changes. BP improving -Recommend resuming home antihypertensive medications (lisinopril -hydrochlorothiazide ) if BP remains stabilizes.  -Continue home metoprolol  succinate 25 mg daily with hold parameters. Hold if SBP < 100. -Continue ASA 81 mg, atorvastatin  10 mg daily. -Recommend continuing breathing treatments for COPD.    Patient is at elevated but acceptable risk for proceeding with surgery given cardiac history understanding risk/benefits.  Can proceed with left femoral endarterectomy without further cardiac diagnostics needed.    This patient's plan of care was discussed and created with Dr. Wilburn and he is in agreement.   Reproductive/Obstetrics                              Anesthesia Physical Anesthesia Plan  ASA: 3  Anesthesia Plan: General   Post-op Pain Management:    Induction: Intravenous  PONV Risk Score and Plan: 3 and Treatment may vary due to age or medical condition, Propofol  infusion and TIVA  Airway Management Planned: Natural Airway and Nasal Cannula  Additional Equipment:   Intra-op Plan:   Post-operative Plan:   Informed Consent: I have reviewed the patients History and Physical, chart, labs and discussed the procedure including the risks, benefits and alternatives for the proposed anesthesia with the patient or authorized representative who has indicated his/her understanding and acceptance.     Dental advisory given  Plan Discussed with: CRNA  Anesthesia Plan Comments: (Patient consented for risks of anesthesia including but not limited to:  - adverse reactions to medications - damage to eyes, teeth, lips or other  oral mucosa - nerve damage due to positioning  - sore throat or hoarseness - damage to heart, brain, nerves, lungs, other parts of body or loss of life  Informed patient about role of CRNA in peri- and intra-operative care.  Patient voiced understanding.)         Anesthesia Quick Evaluation

## 2024-04-03 NOTE — Plan of Care (Signed)

## 2024-04-03 NOTE — Discharge Instructions (Signed)
 Your bleeding was likely from your diverticula or hemorrhoids but no active bleeding was seen during your colonoscopy. A few polyps were removed and will be biopsied. Please follow up with GI to discuss your results.  Your blood level, hgb, is stable. Please follow up with your PCP in about 1-2 weeks in order to recheck your blood level.

## 2024-04-03 NOTE — Op Note (Signed)
 [facility] Patient Name: Angela Jensen Procedure Date: 04/03/2024 11:21 AM MRN: [id number] Account #: [id number] Date of Birth: 02-17-1956 Admit Type: Inpatient Age: 68 Room: [facility] ENDO ROOM 4 Gender: Female Note Status: Finalized Instrument Name: Colon Scope [id number] Procedure:             Colonoscopy Indications:           Hematochezia Providers:             Rogelia Copping MD, MD Referring MD:          Mardy Maxin (Referring MD) Medicines:             Propofol  per Anesthesia Complications:         No immediate complications. Procedure:             Pre-Anesthesia Assessment:                        - Prior to the procedure, a History and Physical was                         performed, and patient medications and allergies were                         reviewed. The patient's tolerance of previous                         anesthesia was also reviewed. The risks and benefits                         of the procedure and the sedation options and risks                         were discussed with the patient. All questions were                         answered, and informed consent was obtained. Prior                         Anticoagulants: The patient has taken no anticoagulant                         or antiplatelet agents. ASA Grade Assessment: II - A                         patient with mild systemic disease. After reviewing                         the risks and benefits, the patient was deemed in                         satisfactory condition to undergo the procedure.                        After obtaining informed consent, the colonoscope was                         passed under direct vision. Throughout the procedure,  the patient's blood pressure, pulse, and oxygen                          saturations were monitored continuously. The                         Colonoscope was introduced through the anus and                          advanced to the the cecum, identified by appendiceal                         orifice and ileocecal valve. The colonoscopy was                         performed without difficulty. The patient tolerated                         the procedure well. The quality of the bowel                         preparation was good. Findings:      The perianal and digital rectal examinations were normal.      A 2 mm polyp was found in the descending colon. The polyp was sessile.       The polyp was removed with a cold biopsy forceps. Resection and       retrieval were complete.      A 6 mm polyp was found in the ascending colon. The polyp was sessile.       The polyp was removed with a cold snare. Resection and retrieval were       complete.      A 3 mm polyp was found in the transverse colon. The polyp was sessile.       The polyp was removed with a cold snare. Resection and retrieval were       complete.      Non-bleeding internal hemorrhoids were found during retroflexion. The       hemorrhoids were Grade II (internal hemorrhoids that prolapse but reduce       spontaneously).      Multiple small-mouthed diverticula were found in the entire colon. Impression:            - One 2 mm polyp in the descending colon, removed with                         a cold biopsy forceps. Resected and retrieved.                        - One 6 mm polyp in the ascending colon, removed with                         a cold snare. Resected and retrieved.                        - One 3 mm polyp in the transverse colon, removed with                         a cold snare. Resected and retrieved.                        -  Non-bleeding internal hemorrhoids.                        - Diverticulosis in the entire examined colon.                        - Bleeding likely diverticular vs hemorrhoidal Recommendation:        - Return patient to hospital ward for ongoing care.                        - Resume previous diet.                         - Continue present medications.                        - Await pathology results.                        - If any further bleeding then a bleeding scan or CT                         angio for the source with follow up vascular surgery                         vs surgical treatment. Procedure Code(s):     --- Professional ---                        [id number], Colonoscopy, flexible; with removal of                         tumor(s), polyp(s), or other lesion(s) by snare                         technique                        45380, 59, Colonoscopy, flexible; with biopsy, single                         or multiple Diagnosis Code(s):     --- Professional ---                        K92.1, Melena (includes Hematochezia)                        D12.2, Benign neoplasm of ascending colon CPT copyright 2022 [employer]. All rights reserved. The codes documented in this report are preliminary and upon coder review may  be revised to meet current compliance requirements. Rogelia Copping MD, MD 04/03/2024 12:14:43 PM This report has been signed electronically. Number of Addenda: 0 Note Initiated On: 04/03/2024 11:21 AM Scope Withdrawal Time: 0 hours 6 minutes 51 seconds  Total Procedure Duration: 0 hours 17 minutes 3 seconds  Estimated Blood Loss:  Estimated blood loss: none.      [facility]

## 2024-04-03 NOTE — Anesthesia Postprocedure Evaluation (Signed)
 Anesthesia Post Note  Patient: Angela Jensen  Procedure(s) Performed: COLONOSCOPY POLYPECTOMY, INTESTINE  Patient location during evaluation: Endoscopy Anesthesia Type: General Level of consciousness: awake and alert Pain management: pain level controlled Vital Signs Assessment: post-procedure vital signs reviewed and stable Respiratory status: spontaneous breathing, nonlabored ventilation, respiratory function stable and patient connected to nasal cannula oxygen  Cardiovascular status: blood pressure returned to baseline and stable Postop Assessment: no apparent nausea or vomiting Anesthetic complications: no   No notable events documented.   Last Vitals:  Vitals:   04/03/24 1218 04/03/24 1228  BP: (!) 90/49 103/60  Pulse: 68 71  Resp: (!) 23 (!) 31  Temp:    SpO2: 97% 95%    Last Pain:  Vitals:   04/03/24 1228  TempSrc:   PainSc: 0-No pain                 Prentice Murphy

## 2024-04-03 NOTE — Transfer of Care (Signed)
 Immediate Anesthesia Transfer of Care Note  Patient: Angela Jensen Brain  Procedure(s) Performed: COLONOSCOPY POLYPECTOMY, INTESTINE  Patient Location: PACU  Anesthesia Type:MAC  Level of Consciousness: drowsy  Airway & Oxygen  Therapy: Patient Spontanous Breathing and Patient connected to face mask oxygen   Post-op Assessment: Report given to RN and Post -op Vital signs reviewed and stable  Post vital signs: stable  Last Vitals:  Vitals Value Taken Time  BP 93/61 04/03/24 12:08  Temp    Pulse 80 04/03/24 12:08  Resp 16 04/03/24 12:08  SpO2 100 % 04/03/24 12:08  Vitals shown include unfiled device data.  Last Pain:  Vitals:   04/03/24 1049  TempSrc: Temporal  PainSc: 0-No pain      Patients Stated Pain Goal: 0 (04/02/24 2153)  Complications: No notable events documented.

## 2024-04-04 ENCOUNTER — Ambulatory Visit: Payer: Self-pay | Admitting: Gastroenterology

## 2024-04-04 LAB — SURGICAL PATHOLOGY

## 2024-04-05 ENCOUNTER — Ambulatory Visit: Admitting: Nurse Practitioner

## 2024-04-05 ENCOUNTER — Encounter: Payer: Self-pay | Admitting: Nurse Practitioner

## 2024-04-05 VITALS — BP 142/84 | HR 75 | Temp 98.1°F | Resp 16 | Ht 59.0 in | Wt 114.2 lb

## 2024-04-05 DIAGNOSIS — K922 Gastrointestinal hemorrhage, unspecified: Secondary | ICD-10-CM | POA: Diagnosis not present

## 2024-04-05 DIAGNOSIS — J449 Chronic obstructive pulmonary disease, unspecified: Secondary | ICD-10-CM | POA: Diagnosis not present

## 2024-04-05 DIAGNOSIS — F419 Anxiety disorder, unspecified: Secondary | ICD-10-CM

## 2024-04-05 DIAGNOSIS — Z09 Encounter for follow-up examination after completed treatment for conditions other than malignant neoplasm: Secondary | ICD-10-CM

## 2024-04-05 DIAGNOSIS — R0602 Shortness of breath: Secondary | ICD-10-CM | POA: Diagnosis not present

## 2024-04-05 DIAGNOSIS — J9611 Chronic respiratory failure with hypoxia: Secondary | ICD-10-CM

## 2024-04-05 DIAGNOSIS — I10 Essential (primary) hypertension: Secondary | ICD-10-CM | POA: Diagnosis not present

## 2024-04-05 MED ORDER — ALPRAZOLAM 0.5 MG PO TABS: 0.5000 mg | ORAL_TABLET | Freq: Two times a day (BID) | ORAL | 2 refills | Status: DC | PRN

## 2024-04-05 NOTE — Progress Notes (Signed)
 [facility] [place], [place] [address] [place] [address] [phone]                                   Transitional Care Clinic   [facility] Discharge Acute Issues Care Follow Up                                                                        Patient Demographics  Angela Jensen, is a 68 y.o. female  DOB [birth year]  MRN [id number].  Primary MD  Liana Fish, NP  Admit date : 03/31/2024 Discharge date: 04/03/2024  Reason for TCC follow Up - gastrointestinal hemorrhage   Past Medical History:  Diagnosis Date   Anemia    Arthritis    Asthma    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/2016   Cancer (HCC) 2012   Right Lung CA   COPD (chronic obstructive pulmonary disease) (HCC)    Depression    Diabetes mellitus without complication (HCC)    Patient takes Janumet   Essential hypertension 05/26/2016   Heart failure (HCC) 2022   Hydropneumothorax 05/03/2020   Hypercholesteremia    Oxygen  dependent    2L at nite    PAD (peripheral artery disease) (HCC) 06/22/2016   Peripheral vascular disease (HCC)    Personal history of radiation therapy    Shortness of breath dyspnea    with exertion    Sialolithiasis    Sleep apnea    Wears dentures    full upper and lower    Past Surgical History:  Procedure Laterality Date   APPLICATION OF CELL SAVER Left 01/21/2024   Procedure: APPLICATION OF CELL SAVER;  Surgeon: Marea Selinda RAMAN, MD;  Location: [facility];  Service: Vascular;  Laterality: Left;   CESAREAN SECTION     x3   COLONOSCOPY N/A 04/03/2024   Procedure: COLONOSCOPY;  Surgeon: Jinny Carmine, MD;  Location: [facility];  Service: Endoscopy;  Laterality: N/A;   COLONOSCOPY WITH PROPOFOL  N/A 06/25/2015   Procedure: COLONOSCOPY WITH PROPOFOL ;  Surgeon: Carmine Jinny, MD;  Location: [facility];  Service: Endoscopy;  Laterality: N/A;   COLONOSCOPY WITH PROPOFOL  N/A 07/26/2020    Procedure: COLONOSCOPY WITH PROPOFOL ;  Surgeon: Jinny Carmine, MD;  Location: [facility];  Service: Endoscopy;  Laterality: N/A;   CYST REMOVAL LEG     and on shoulder    ENDARTERECTOMY FEMORAL Left 01/21/2024   Procedure: LEFT COMMON, SUPERFICIAL FEMORAL AND PROFUNDA FEMORIS ENDARTERECTOMY WITH BOVINE PERICARDIAL PATCH ANGIOPLASTY;  Surgeon: Marea Selinda RAMAN, MD;  Location: [facility];  Service: Vascular;  Laterality: Left;   ESOPHAGOGASTRODUODENOSCOPY (EGD) WITH PROPOFOL  N/A 07/26/2020   Procedure: ESOPHAGOGASTRODUODENOSCOPY (EGD) WITH PROPOFOL ;  Surgeon: Jinny Carmine, MD;  Location: [facility];  Service: Endoscopy;  Laterality: N/A;  Diabetic - oral meds   INSERTION OF ILIAC STENT Left 01/21/2024   Procedure: INSERTION, STENT, ARTERY, ILIAC;  Surgeon: Marea Selinda RAMAN, MD;  Location: [facility];  Service: Vascular;  Laterality: Left;  AND SFA STENTS   LOWER EXTREMITY ANGIOGRAPHY Left 09/29/2018   Procedure: LOWER EXTREMITY ANGIOGRAPHY;  Surgeon: Marea Selinda RAMAN, MD;  Location: [facility];  Service: Cardiovascular;  Laterality: Left;   LOWER EXTREMITY ANGIOGRAPHY Left 07/29/2023   Procedure: Lower Extremity Angiography;  Surgeon: Marea Selinda RAMAN, MD;  Location: [facility];  Service: Cardiovascular;  Laterality: Left;   LOWER EXTREMITY ANGIOGRAPHY Right 12/20/2023   Procedure: Lower Extremity Angiography;  Surgeon: Marea Selinda RAMAN, MD;  Location: [facility];  Service: Cardiovascular;  Laterality: Right;   LOWER EXTREMITY ANGIOGRAPHY Left 01/17/2024   Procedure: Lower Extremity Angiography;  Surgeon: Marea Selinda RAMAN, MD;  Location: [facility];  Service: Cardiovascular;  Laterality: Left;   LUNG BIOPSY  12/30/2011   has lung spots   PACEMAKER IMPLANT  07/14/2021   PACEMAKER LEADLESS INSERTION N/A 07/14/2021   Procedure: PACEMAKER LEADLESS INSERTION;  Surgeon: Ammon Blunt, MD;  Location: [facility];  Service: Cardiovascular;  Laterality: N/A;    PERIPHERAL VASCULAR CATHETERIZATION Left 06/01/2016   Procedure: Lower Extremity Angiography;  Surgeon: Selinda RAMAN Marea, MD;  Location: [facility];  Service: Cardiovascular;  Laterality: Left;   PERIPHERAL VASCULAR CATHETERIZATION N/A 06/01/2016   Procedure: Abdominal Aortogram w/Lower Extremity;  Surgeon: Selinda RAMAN Marea, MD;  Location: [facility];  Service: Cardiovascular;  Laterality: N/A;   PERIPHERAL VASCULAR CATHETERIZATION  06/01/2016   Procedure: Lower Extremity Intervention;  Surgeon: Selinda RAMAN Marea, MD;  Location: [facility];  Service: Cardiovascular;;   PERIPHERAL VASCULAR CATHETERIZATION Right 06/08/2016   Procedure: Lower Extremity Angiography;  Surgeon: Selinda RAMAN Marea, MD;  Location: [facility];  Service: Cardiovascular;  Laterality: Right;   PERIPHERAL VASCULAR CATHETERIZATION  06/08/2016   Procedure: Lower Extremity Intervention;  Surgeon: Selinda RAMAN Marea, MD;  Location: [facility];  Service: Cardiovascular;;   POLYPECTOMY  04/03/2024   Procedure: POLYPECTOMY, INTESTINE;  Surgeon: Jinny Carmine, MD;  Location: [facility];  Service: Endoscopy;;   SUBMANDIBULAR GLAND EXCISION Left 12/06/2020   Procedure: EXCISION SUBMANDIBULAR GLAND;  Surgeon: Herminio Miu, MD;  Location: [facility];  Service: ENT;  Laterality: Left;  needs to be first case Diabetic - diet controlled   TEMPORARY PACEMAKER N/A 07/11/2021   Procedure: TEMPORARY PACEMAKER;  Surgeon: Ammon Blunt, MD;  Location: [facility];  Service: Cardiovascular;  Laterality: N/A;       Recent HPI and Hospital Course  HPI: [name] is a 68 y.o. female with medical history significant for COPD on 4 L home O2, right lung cancer status post radiation therapy, hx pneumothorax, SSS s/p pacemaker, HFpEF(50%), HTN, DM, PAD with history of recent hospitalization (8/3-8 03/24/2024) for respiratory failure secondary to CAP/COPD associated with hemoptysis(treated with TXA),  now being admitted for hematochezia as well as concern for HCAP and COPD exacerbation . She initially presented for evaluation of rectal bleeding that started today, however during workup in the ED, she developed acute shortness of breath which resolved with a DuoNeb.  She denied cough or hemoptysis.  Denies nausea, vomiting or hematemesis.  Denies abdominal pain. In the ED initial vitals within normal limits, saturating at 91 on 3 L Labs, including troponin, BNP, CBC, CMP were all unremarkable except for hemoglobin of 10.9(baseline 9.5) and elevated blood glucose of 243. Digital rectal exam with maroon-colored stool EKG showed possible Mobitz type 1 at 76 without acute ST-T wave changes(has pacemaker). Chest x-ray, unofficial read showing no change from prior pneumonia from a week ago. Patient was treated with DuoNebs, Solu-Medrol  and started on Zosyn  and vancomycin   Colonoscopy planned for tomorrow pending patient stability and endoscopy suite availability Monday morning will be 3 day washout from plavix  (last dose 8/15) Clear liquids now NPO at midnight- ok to drink prep after midnight Golytely  to start today (8/17); ok to drink after npo order in effect. Additional prn gallon ordered if stools not clear after first gallon.    Labs morning of procedure- bmp, cbc, inr Plavix  and dvt ppx currently held Monitor H&H.  Transfusion and resuscitation as per primary team Avoid frequent lab draws to prevent lab induced anemia Supportive care and antiemetics as per primary team Maintain two sites IV access Avoid nsaids Monitor for GIB.   Colonoscopy with possible biopsy, control of bleeding, polypectomy, and interventions as necessary has been discussed with the patient/patient representative. Informed consent was obtained from the patient/patient representative after explaining the indication, nature, and risks of the procedure including but not limited to death, bleeding, perforation, missed  neoplasm/lesions, cardiorespiratory compromise, and reaction to medications. Opportunity for questions was given and appropriate answers were provided. Patient/patient representative has verbalized understanding is amenable to undergoing the procedure.   Management of other medical comorbidities as per primary team    Post Hospital Acute Care Issue to be followed in the Clinic   Chronic respiratory failure with hypoxia COPD Diabetes PAD HF    Subjective:   Ronal Brain today has, No headache, No chest pain, No abdominal pain - No Nausea, No new weakness tingling or numbness, No Cough - SOB. Reports small amount of blood on toilet paper when wiping after a BM. Also noted pink tinged sputum when coughing.   Assessment & Plan   1. Gastrointestinal hemorrhage, unspecified gastrointestinal hemorrhage type (Primary) Repeat CBC ordered.  - CBC with Differential/Platelet  2. Chronic respiratory failure with hypoxia (HCC) Sees pulmonology, on inhalers and continuous supplemental oxygen   3. Chronic obstructive pulmonary disease, unspecified COPD type (HCC) Sees pulmonology, on inhalers and continuous supplemental oxygen    4. Anxiety Continue alprazolam  as needed as prescribed. Follow up in 3 months for additional refills.  - ALPRAZolam  (XANAX ) 0.5 MG tablet; Take 1 tablet (0.5 mg total) by mouth 2 (two) times daily as needed for anxiety.  Dispense: 60 tablet; Refill: 2  5. Hospital discharge follow-up Treated in hospital for GI bleed    Reason for frequent admissions/ER visits    Chronic respiratory failure with hypoxia COPD Diabetes PAD HF   Objective:   Vitals:   04/05/24 0959  BP: (!) 142/84  Pulse: 75  Resp: 16  Temp: 98.1 F (36.7 C)  SpO2: 93%  Weight: 114 lb 3.2 oz (51.8 kg)  Height: 4' 11 (1.499 m)    Wt Readings from Last 3 Encounters:  04/05/24 114 lb 3.2 oz (51.8 kg)  04/01/24 119 lb 4.3 oz (54.1 kg)  03/28/24 120 lb (54.4 kg)    Allergies as of  04/05/2024       Reactions   Iodinated Contrast Media Itching   Trelegy Ellipta  [fluticasone -umeclidin-vilant]    Had breathing issues   Bactoshield Chg [chlorhexidine  Gluconate] Itching, Other (See Comments)   Dry. Flaking, peeling skin        Medication List        Accurate as of April 05, 2024 10:38 AM. If you have any questions, ask your nurse or doctor.          Accu-Chek Guide Test test strip Generic drug: glucose blood USE TO TEST BLOOD SUGAR 5 TIMES DAILY   Accu-Chek Guide w/Device Kit Use  as directed Dx e11.65   Accu-Chek Softclix Lancets lancets Use 1 lancet 3 times daily to check glucose for diabetes   albuterol  (5 MG/ML) 0.5% nebulizer solution Commonly known as: PROVENTIL  Take 2.5 mg by nebulization every 6 (six) hours as needed for wheezing or shortness of breath.   albuterol  108 (90 Base) MCG/ACT inhaler Commonly known as: VENTOLIN  HFA INHALE 2 PUFFS BY MOUTH EVERY 6 HOURS AS NEEDED FOR WHEEZING OR SHORTNESS OF BREATH   ALPRAZolam  0.5 MG tablet Commonly known as: XANAX  Take 1 tablet (0.5 mg total) by mouth 2 (two) times daily as needed for anxiety.   aspirin  EC 81 MG tablet Take 1 tablet (81 mg total) by mouth daily. Swallow whole.   atorvastatin  10 MG tablet Commonly known as: Lipitor Take 1 tablet (10 mg total) by mouth daily.   calcium  carbonate 1500 (600 Ca) MG Tabs tablet Commonly known as: OSCAL Take 600 mg of elemental calcium  by mouth 2 (two) times daily with a meal.   clopidogrel  75 MG tablet Commonly known as: PLAVIX  Take 1 tablet (75 mg total) by mouth daily.   cyclobenzaprine  10 MG tablet Commonly known as: FLEXERIL  Take 1 tablet (10 mg total) by mouth at bedtime. Take one tab po qhs for back spasm prn only What changed:  when to take this reasons to take this   dexamethasone  1 MG tablet Commonly known as: DECADRON  Take 1 mg by mouth.   Dulera  200-5 MCG/ACT Aero Generic drug: mometasone -formoterol  Inhale 2 puffs by  mouth twice daily   escitalopram  10 MG tablet Commonly known as: LEXAPRO  Take 1 tablet (10 mg total) by mouth daily.   ferrous sulfate  325 (65 FE) MG tablet Take 325 mg by mouth 2 (two) times daily with a meal.   furosemide  20 MG tablet Commonly known as: LASIX  Take 1 tablet (20 mg total) by mouth daily as needed.   insulin  glargine (1 Unit Dial ) 300 UNIT/ML Solostar Pen Commonly known as: TOUJEO  Inject 10 Units into the skin daily.   ipratropium-albuterol  0.5-2.5 (3) MG/3ML Soln Commonly known as: DUONEB Take 3 mLs by nebulization every 6 (six) hours as needed.   losartan  25 MG tablet Commonly known as: COZAAR  Take 0.5 tablets (12.5 mg total) by mouth daily.   metFORMIN  500 MG 24 hr tablet Commonly known as: GLUCOPHAGE -XR TAKE 2 TABLETS BY MOUTH ONCE DAILY WITH BREAKFAST   metoprolol  succinate 50 MG 24 hr tablet Commonly known as: TOPROL -XL Take 1 tablet (50 mg total) by mouth daily. Take with or immediately following a meal.   oxyCODONE -acetaminophen  5-325 MG tablet Commonly known as: PERCOCET/ROXICET Take 1 tablet by mouth every 6 (six) hours as needed for severe pain (pain score 7-10).   OXYGEN  Inhale 4 L into the lungs. PT USES [employer] FOR OXYGEN    pantoprazole  40 MG tablet Commonly known as: Protonix  Take 1 tablet (40 mg total) by mouth daily.   potassium chloride  10 MEQ tablet Commonly known as: KLOR-CON  TAKE 1 TABLET BY MOUTH EVERY OTHER DAY   pregabalin  75 MG capsule Commonly known as: Lyrica  Take 1 capsule (75 mg total) by mouth 2 (two) times daily.   ReliOn Pen Needles 31G X 6 MM Misc Generic drug: Insulin  Pen Needle USE 1 PEN NEEDLE WITH INSULIN  PEN ONCE DAILY FOR DIABETES   Spiriva  HandiHaler 18 MCG inhalation capsule Generic drug: tiotropium INHALE THE CONTENTS OF 1 CAPSULES BY MOUTH ONCE DAILY - DO NOT SWALLOW CAPSULES   sulfamethoxazole -trimethoprim  400-80 MG tablet Commonly known as:  BACTRIM  Take by mouth.   vitamin B-12 100 MCG  tablet Commonly known as: CYANOCOBALAMIN  Take 100 mcg by mouth daily.   VITAMIN D  (CHOLECALCIFEROL ) PO Take 1 tablet by mouth in the morning.         Physical Exam: Constitutional: Patient appears well-developed and well-nourished. Not in obvious distress. HENT: Normocephalic, atraumatic, External right and left ear normal. Oropharynx is clear and moist.  Eyes: Conjunctivae and EOM are normal. PERRLA, no scleral icterus. Neck: Normal ROM. Neck supple. No JVD. No tracheal deviation. No thyromegaly. CVS: RRR, S1/S2 +, no murmurs, no gallops, no carotid bruit.  Pulmonary: Effort and breath sounds normal, bilateral rales right worse than left. Abdominal: Soft. BS +, no distension, tenderness, rebound or guarding.  Musculoskeletal: Normal range of motion. No edema and no tenderness.  Lymphadenopathy: No lymphadenopathy noted, cervical, inguinal or axillary Neuro: Alert. Normal reflexes, muscle tone coordination. No cranial nerve deficit. Skin: Skin is warm and dry. No rash noted. Not diaphoretic. No erythema. No pallor. Psychiatric: Normal mood and affect. Behavior, judgment, thought content normal.   Data Review   Micro Results Recent Results (from the past 240 hours)  Resp panel by RT-PCR (RSV, Flu A&B, Covid) Anterior Nasal Swab     Status: None   Collection Time: 03/31/24 11:48 PM   Specimen: Anterior Nasal Swab  Result Value Ref Range Status   SARS Coronavirus 2 by RT PCR NEGATIVE NEGATIVE Final    Comment: (NOTE) SARS-CoV-2 target nucleic acids are NOT DETECTED.  The SARS-CoV-2 RNA is generally detectable in upper respiratory specimens during the acute phase of infection. The lowest concentration of SARS-CoV-2 viral copies this assay can detect is 138 copies/mL. A negative result does not preclude SARS-Cov-2 infection and should not be used as the sole basis for treatment or other patient management decisions. A negative result may occur with  improper specimen  collection/handling, submission of specimen other than nasopharyngeal swab, presence of viral mutation(s) within the areas targeted by this assay, and inadequate number of viral copies(<138 copies/mL). A negative result must be combined with clinical observations, patient history, and epidemiological information. The expected result is Negative.  Fact Sheet for Patients:  BloggerCourse.com  Fact Sheet for Healthcare Providers:  SeriousBroker.it  This test is no t yet approved or cleared by the [place]  FDA and  has been authorized for detection and/or diagnosis of SARS-CoV-2 by FDA under an Emergency Use Authorization (EUA). This EUA will remain  in effect (meaning this test can be used) for the duration of the COVID-19 declaration under Section 564(b)(1) of the Act, 21 U.S.C.section 360bbb-3(b)(1), unless the authorization is terminated  or revoked sooner.       Influenza A by PCR NEGATIVE NEGATIVE Final   Influenza B by PCR NEGATIVE NEGATIVE Final    Comment: (NOTE) The Xpert Xpress SARS-CoV-2/FLU/RSV plus assay is intended as an aid in the diagnosis of influenza from Nasopharyngeal swab specimens and should not be used as a sole basis for treatment. Nasal washings and aspirates are unacceptable for Xpert Xpress SARS-CoV-2/FLU/RSV testing.  Fact Sheet for Patients: BloggerCourse.com  Fact Sheet for Healthcare Providers: SeriousBroker.it  This test is not yet approved or cleared by the [place]  FDA and has been authorized for detection and/or diagnosis of SARS-CoV-2 by FDA under an Emergency Use Authorization (EUA). This EUA will remain in effect (meaning this test can be used) for the duration of the COVID-19 declaration under Section 564(b)(1) of the Act, 21 U.S.C. section 360bbb-3(b)(1), unless the  authorization is terminated or revoked.     Resp Syncytial  Virus by PCR NEGATIVE NEGATIVE Final    Comment: (NOTE) Fact Sheet for Patients: BloggerCourse.com  Fact Sheet for Healthcare Providers: SeriousBroker.it  This test is not yet approved or cleared by the [place]  FDA and has been authorized for detection and/or diagnosis of SARS-CoV-2 by FDA under an Emergency Use Authorization (EUA). This EUA will remain in effect (meaning this test can be used) for the duration of the COVID-19 declaration under Section 564(b)(1) of the Act, 21 U.S.C. section 360bbb-3(b)(1), unless the authorization is terminated or revoked.  Performed at [facility], [address] [place], [place] [address]      CBC Recent Labs  Lab 03/31/24 1848 04/01/24 0038 04/01/24 0534 04/02/24 0630 04/03/24 0548  WBC 9.2  --   --  7.9 6.1  HGB 10.9* 10.5* 10.5* 9.3* 9.1*  HCT 36.5  --   --  30.5* 29.9*  PLT 283  --   --  228 224  MCV 94.3  --   --  92.4 94.0  MCH 28.2  --   --  28.2 28.6  MCHC 29.9*  --   --  30.5 30.4  RDW 14.9  --   --  14.7 14.7    Chemistries  Recent Labs  Lab 03/31/24 1848  NA 140  K 4.5  CL 101  CO2 28  GLUCOSE 243*  BUN 19  CREATININE 0.78  CALCIUM  9.6  AST 39  ALT 37  ALKPHOS 80  BILITOT 0.9   ------------------------------------------------------------------------------------------------------------------ estimated creatinine clearance is 45.9 mL/min (by C-G formula based on SCr of 0.78 mg/dL). ------------------------------------------------------------------------------------------------------------------ No results for input(s): HGBA1C in the last 72 hours. ------------------------------------------------------------------------------------------------------------------ No results for input(s): CHOL, HDL, LDLCALC, TRIG, CHOLHDL, LDLDIRECT in the last 72  hours. ------------------------------------------------------------------------------------------------------------------ No results for input(s): TSH, T4TOTAL, T3FREE, THYROIDAB in the last 72 hours.  Invalid input(s): FREET3 ------------------------------------------------------------------------------------------------------------------ No results for input(s): VITAMINB12, FOLATE, FERRITIN, TIBC, IRON , RETICCTPCT in the last 72 hours.  Coagulation profile No results for input(s): INR, PROTIME in the last 168 hours.  No results for input(s): DDIMER in the last 72 hours.  Cardiac Enzymes No results for input(s): CKMB, TROPONINI, MYOGLOBIN in the last 168 hours.  Invalid input(s): CK ------------------------------------------------------------------------------------------------------------------ Invalid input(s): POCBNP  Return in about 12 weeks (around 06/28/2024) for F/U, anxiety med refill, Kelleigh Skerritt PCP.   Time Spent in minutes  45 Time spent with patient included reviewing progress notes, labs, imaging studies, and discussing plan for follow up.    This patient was seen by Mardy Maxin, FNP-C in collaboration with Dr. Sigrid Bathe as a part of collaborative care agreement.    Mardy Maxin MSN, FNP-C on 04/05/2024 at 10:38 AM   **Disclaimer: This note may have been dictated with voice recognition software. Similar sounding words can inadvertently be transcribed and this note may contain transcription errors which may not have been corrected upon publication of note.**

## 2024-04-06 ENCOUNTER — Telehealth

## 2024-04-07 ENCOUNTER — Encounter: Attending: Pulmonary Disease | Admitting: *Deleted

## 2024-04-07 DIAGNOSIS — J449 Chronic obstructive pulmonary disease, unspecified: Secondary | ICD-10-CM | POA: Diagnosis not present

## 2024-04-07 NOTE — Progress Notes (Signed)
 Initial phone call completed. Diagnosis can be found in [facility] 7/23. EP Orientation scheduled for Thursday 8/28 at 2pm.

## 2024-04-09 ENCOUNTER — Emergency Department

## 2024-04-09 ENCOUNTER — Other Ambulatory Visit: Payer: Self-pay

## 2024-04-09 ENCOUNTER — Inpatient Hospital Stay
Admission: EM | Admit: 2024-04-09 | Discharge: 2024-04-12 | DRG: 190 | Disposition: A | Discharge status: Home Health | Attending: Internal Medicine | Admitting: Internal Medicine

## 2024-04-09 DIAGNOSIS — Z950 Presence of cardiac pacemaker: Secondary | ICD-10-CM

## 2024-04-09 DIAGNOSIS — J449 Chronic obstructive pulmonary disease, unspecified: Secondary | ICD-10-CM | POA: Diagnosis not present

## 2024-04-09 DIAGNOSIS — E1051 Type 1 diabetes mellitus with diabetic peripheral angiopathy without gangrene: Secondary | ICD-10-CM | POA: Diagnosis present

## 2024-04-09 DIAGNOSIS — Z83438 Family history of other disorder of lipoprotein metabolism and other lipidemia: Secondary | ICD-10-CM | POA: Diagnosis not present

## 2024-04-09 DIAGNOSIS — J9621 Acute and chronic respiratory failure with hypoxia: Secondary | ICD-10-CM | POA: Diagnosis present

## 2024-04-09 DIAGNOSIS — R042 Hemoptysis: Secondary | ICD-10-CM | POA: Diagnosis present

## 2024-04-09 DIAGNOSIS — Z7984 Long term (current) use of oral hypoglycemic drugs: Secondary | ICD-10-CM | POA: Diagnosis not present

## 2024-04-09 DIAGNOSIS — R918 Other nonspecific abnormal finding of lung field: Secondary | ICD-10-CM | POA: Diagnosis not present

## 2024-04-09 DIAGNOSIS — Z9981 Dependence on supplemental oxygen: Secondary | ICD-10-CM | POA: Diagnosis not present

## 2024-04-09 DIAGNOSIS — J441 Chronic obstructive pulmonary disease with (acute) exacerbation: Secondary | ICD-10-CM | POA: Diagnosis not present

## 2024-04-09 DIAGNOSIS — Z79899 Other long term (current) drug therapy: Secondary | ICD-10-CM

## 2024-04-09 DIAGNOSIS — Z860100 Personal history of colon polyps, unspecified: Secondary | ICD-10-CM

## 2024-04-09 DIAGNOSIS — Z888 Allergy status to other drugs, medicaments and biological substances status: Secondary | ICD-10-CM

## 2024-04-09 DIAGNOSIS — Z91048 Other nonmedicinal substance allergy status: Secondary | ICD-10-CM

## 2024-04-09 DIAGNOSIS — Z87891 Personal history of nicotine dependence: Secondary | ICD-10-CM

## 2024-04-09 DIAGNOSIS — Z8249 Family history of ischemic heart disease and other diseases of the circulatory system: Secondary | ICD-10-CM | POA: Diagnosis not present

## 2024-04-09 DIAGNOSIS — J90 Pleural effusion, not elsewhere classified: Secondary | ICD-10-CM | POA: Diagnosis not present

## 2024-04-09 DIAGNOSIS — I110 Hypertensive heart disease with heart failure: Secondary | ICD-10-CM | POA: Diagnosis not present

## 2024-04-09 DIAGNOSIS — Z794 Long term (current) use of insulin: Secondary | ICD-10-CM

## 2024-04-09 DIAGNOSIS — Z7982 Long term (current) use of aspirin: Secondary | ICD-10-CM | POA: Diagnosis not present

## 2024-04-09 DIAGNOSIS — Z801 Family history of malignant neoplasm of trachea, bronchus and lung: Secondary | ICD-10-CM | POA: Diagnosis not present

## 2024-04-09 DIAGNOSIS — I739 Peripheral vascular disease, unspecified: Secondary | ICD-10-CM | POA: Diagnosis present

## 2024-04-09 DIAGNOSIS — Z923 Personal history of irradiation: Secondary | ICD-10-CM

## 2024-04-09 DIAGNOSIS — I5033 Acute on chronic diastolic (congestive) heart failure: Secondary | ICD-10-CM | POA: Diagnosis not present

## 2024-04-09 DIAGNOSIS — J440 Chronic obstructive pulmonary disease with acute lower respiratory infection: Secondary | ICD-10-CM | POA: Diagnosis not present

## 2024-04-09 DIAGNOSIS — Z91041 Radiographic dye allergy status: Secondary | ICD-10-CM

## 2024-04-09 DIAGNOSIS — J701 Chronic and other pulmonary manifestations due to radiation: Secondary | ICD-10-CM | POA: Diagnosis not present

## 2024-04-09 DIAGNOSIS — Z7902 Long term (current) use of antithrombotics/antiplatelets: Secondary | ICD-10-CM

## 2024-04-09 DIAGNOSIS — Z85118 Personal history of other malignant neoplasm of bronchus and lung: Secondary | ICD-10-CM | POA: Diagnosis not present

## 2024-04-09 DIAGNOSIS — J9622 Acute and chronic respiratory failure with hypercapnia: Secondary | ICD-10-CM

## 2024-04-09 DIAGNOSIS — E7800 Pure hypercholesterolemia, unspecified: Secondary | ICD-10-CM | POA: Diagnosis present

## 2024-04-09 DIAGNOSIS — Z7951 Long term (current) use of inhaled steroids: Secondary | ICD-10-CM | POA: Diagnosis not present

## 2024-04-09 DIAGNOSIS — Z1152 Encounter for screening for COVID-19: Secondary | ICD-10-CM

## 2024-04-09 DIAGNOSIS — Z972 Presence of dental prosthetic device (complete) (partial): Secondary | ICD-10-CM

## 2024-04-09 DIAGNOSIS — R0602 Shortness of breath: Secondary | ICD-10-CM | POA: Diagnosis not present

## 2024-04-09 DIAGNOSIS — Z833 Family history of diabetes mellitus: Secondary | ICD-10-CM | POA: Diagnosis not present

## 2024-04-09 LAB — CBC WITH DIFFERENTIAL/PLATELET
Abs Immature Granulocytes: 0.03 K/uL (ref 0.00–0.07)
Basophils Absolute: 0 K/uL (ref 0.0–0.1)
Basophils Relative: 0 %
Eosinophils Absolute: 0.1 K/uL (ref 0.0–0.5)
Eosinophils Relative: 1 %
HCT: 33.6 % — ABNORMAL LOW (ref 36.0–46.0)
Hemoglobin: 9.6 g/dL — ABNORMAL LOW (ref 12.0–15.0)
Immature Granulocytes: 1 %
Lymphocytes Relative: 17 %
Lymphs Abs: 1 K/uL (ref 0.7–4.0)
MCH: 28.2 pg (ref 26.0–34.0)
MCHC: 28.6 g/dL — ABNORMAL LOW (ref 30.0–36.0)
MCV: 98.5 fL (ref 80.0–100.0)
Monocytes Absolute: 0.6 K/uL (ref 0.1–1.0)
Monocytes Relative: 10 %
Neutro Abs: 4.3 K/uL (ref 1.7–7.7)
Neutrophils Relative %: 71 %
Platelets: 201 K/uL (ref 150–400)
RBC: 3.41 MIL/uL — ABNORMAL LOW (ref 3.87–5.11)
RDW: 14.7 % (ref 11.5–15.5)
Smear Review: NORMAL
WBC: 6 K/uL (ref 4.0–10.5)
nRBC: 0 % (ref 0.0–0.2)

## 2024-04-09 LAB — BASIC METABOLIC PANEL WITH GFR
Anion gap: 9 (ref 5–15)
BUN: 19 mg/dL (ref 8–23)
CO2: 27 mmol/L (ref 22–32)
Calcium: 8.1 mg/dL — ABNORMAL LOW (ref 8.9–10.3)
Chloride: 104 mmol/L (ref 98–111)
Creatinine, Ser: 0.57 mg/dL (ref 0.44–1.00)
GFR, Estimated: >60 mL/min (ref >60)
Glucose, Bld: 188 mg/dL — ABNORMAL HIGH (ref 70–99)
Potassium: 4.2 mmol/L (ref 3.5–5.1)
Sodium: 140 mmol/L (ref 135–145)

## 2024-04-09 LAB — BLOOD GAS, VENOUS
Acid-Base Excess: 2.4 mmol/L — ABNORMAL HIGH (ref 0.0–2.0)
Bicarbonate: 29.9 mmol/L — ABNORMAL HIGH (ref 20.0–28.0)
O2 Saturation: 99.1 %
Patient temperature: 37
pCO2, Ven: 58 mmHg (ref 44–60)
pH, Ven: 7.32 (ref 7.25–7.43)
pO2, Ven: 94 mmHg — ABNORMAL HIGH (ref 32–45)

## 2024-04-09 LAB — GLUCOSE, CAPILLARY
Glucose-Capillary: 184 mg/dL — ABNORMAL HIGH (ref 70–99)
Glucose-Capillary: 219 mg/dL — ABNORMAL HIGH (ref 70–99)

## 2024-04-09 LAB — BRAIN NATRIURETIC PEPTIDE: B Natriuretic Peptide: 259.4 pg/mL — ABNORMAL HIGH (ref 0.0–100.0)

## 2024-04-09 LAB — RESP PANEL BY RT-PCR (RSV, FLU A&B, COVID)  RVPGX2
Influenza A by PCR: NEGATIVE
Influenza B by PCR: NEGATIVE
Resp Syncytial Virus by PCR: NEGATIVE
SARS Coronavirus 2 by RT PCR: NEGATIVE

## 2024-04-09 LAB — TROPONIN I (HIGH SENSITIVITY)
Troponin I (High Sensitivity): 15 ng/L (ref <18)
Troponin I (High Sensitivity): 13 ng/L (ref <18)

## 2024-04-09 MED ORDER — ONDANSETRON HCL 4 MG/2ML IJ SOLN: 4.0000 mg | Freq: Four times a day (QID) | INTRAMUSCULAR | Status: DC | PRN

## 2024-04-09 MED ORDER — FLUTICASONE FUROATE-VILANTEROL 100-25 MCG/ACT IN AEPB: 1.0000 | INHALATION_SPRAY | Freq: Every day | RESPIRATORY_TRACT | Status: DC

## 2024-04-09 MED ORDER — BISACODYL 5 MG PO TBEC: 5.0000 mg | DELAYED_RELEASE_TABLET | Freq: Every day | ORAL | Status: DC | PRN

## 2024-04-09 MED ORDER — ALPRAZOLAM 0.5 MG PO TABS
0.5000 mg | ORAL_TABLET | Freq: Two times a day (BID) | ORAL | Status: DC | PRN
  Administered 2024-04-10 – 2024-04-12 (×4): 0.5 mg via ORAL
  Filled 2024-04-09 (×4): qty 1

## 2024-04-09 MED ORDER — ENOXAPARIN SODIUM 40 MG/0.4ML IJ SOSY
40.0000 mg | PREFILLED_SYRINGE | INTRAMUSCULAR | Status: DC
  Administered 2024-04-09 – 2024-04-11 (×3): 40 mg via SUBCUTANEOUS
  Filled 2024-04-09 (×3): qty 0.4

## 2024-04-09 MED ORDER — SODIUM CHLORIDE 0.9 % IV SOLN
500.0000 mg | INTRAVENOUS | Status: DC
  Administered 2024-04-09 – 2024-04-11 (×3): 500 mg via INTRAVENOUS
  Filled 2024-04-09 (×4): qty 5

## 2024-04-09 MED ORDER — LOSARTAN POTASSIUM 25 MG PO TABS: 12.5000 mg | ORAL_TABLET | Freq: Every day | ORAL | Status: DC

## 2024-04-09 MED ORDER — BUDESON-GLYCOPYRROL-FORMOTEROL 160-9-4.8 MCG/ACT IN AERO
2.0000 | INHALATION_SPRAY | Freq: Two times a day (BID) | RESPIRATORY_TRACT | Status: DC
  Administered 2024-04-10 – 2024-04-12 (×5): 2 via RESPIRATORY_TRACT
  Filled 2024-04-09 (×2): qty 5.9

## 2024-04-09 MED ORDER — METOPROLOL SUCCINATE ER 50 MG PO TB24
50.0000 mg | ORAL_TABLET | Freq: Every day | ORAL | Status: DC
  Administered 2024-04-10 – 2024-04-12 (×3): 50 mg via ORAL
  Filled 2024-04-09 (×3): qty 1

## 2024-04-09 MED ORDER — LOSARTAN POTASSIUM 25 MG PO TABS
25.0000 mg | ORAL_TABLET | Freq: Every day | ORAL | Status: DC
  Administered 2024-04-10 – 2024-04-12 (×3): 25 mg via ORAL
  Filled 2024-04-09 (×3): qty 1

## 2024-04-09 MED ORDER — INSULIN GLARGINE 100 UNIT/ML ~~LOC~~ SOLN
10.0000 [IU] | Freq: Every day | SUBCUTANEOUS | Status: DC
  Administered 2024-04-09 – 2024-04-11 (×3): 10 [IU] via SUBCUTANEOUS
  Filled 2024-04-09 (×4): qty 0.1

## 2024-04-09 MED ORDER — ONDANSETRON HCL 4 MG PO TABS: 4.0000 mg | ORAL_TABLET | Freq: Four times a day (QID) | ORAL | Status: DC | PRN

## 2024-04-09 MED ORDER — TIOTROPIUM BROMIDE MONOHYDRATE 18 MCG IN CAPS: 18.0000 ug | ORAL_CAPSULE | Freq: Every day | RESPIRATORY_TRACT | Status: DC

## 2024-04-09 MED ORDER — CLOPIDOGREL BISULFATE 75 MG PO TABS
75.0000 mg | ORAL_TABLET | Freq: Every day | ORAL | Status: DC
  Administered 2024-04-09 – 2024-04-11 (×3): 75 mg via ORAL
  Filled 2024-04-09 (×4): qty 1

## 2024-04-09 MED ORDER — PANTOPRAZOLE SODIUM 40 MG PO TBEC
40.0000 mg | DELAYED_RELEASE_TABLET | Freq: Every day | ORAL | Status: DC
  Administered 2024-04-09 – 2024-04-12 (×4): 40 mg via ORAL
  Filled 2024-04-09 (×4): qty 1

## 2024-04-09 MED ORDER — ALBUTEROL SULFATE (2.5 MG/3ML) 0.083% IN NEBU: 2.5000 mg | INHALATION_SOLUTION | Freq: Four times a day (QID) | RESPIRATORY_TRACT | Status: DC | PRN

## 2024-04-09 MED ORDER — LOSARTAN POTASSIUM 25 MG PO TABS
12.5000 mg | ORAL_TABLET | Freq: Once | ORAL | Status: AC
  Administered 2024-04-09: 12.5 mg via ORAL
  Filled 2024-04-09: qty 0.5

## 2024-04-09 MED ORDER — INSULIN ASPART 100 UNIT/ML IJ SOLN
0.0000 [IU] | Freq: Every day | INTRAMUSCULAR | Status: DC
  Administered 2024-04-09 – 2024-04-10 (×2): 2 [IU] via SUBCUTANEOUS
  Filled 2024-04-09 (×2): qty 1

## 2024-04-09 MED ORDER — METHYLPREDNISOLONE SODIUM SUCC 40 MG IJ SOLR
40.0000 mg | Freq: Two times a day (BID) | INTRAMUSCULAR | Status: DC
  Administered 2024-04-09 – 2024-04-12 (×6): 40 mg via INTRAVENOUS
  Filled 2024-04-09 (×6): qty 1

## 2024-04-09 MED ORDER — ESCITALOPRAM OXALATE 10 MG PO TABS
10.0000 mg | ORAL_TABLET | Freq: Every day | ORAL | Status: DC
  Administered 2024-04-09 – 2024-04-12 (×4): 10 mg via ORAL
  Filled 2024-04-09 (×4): qty 1

## 2024-04-09 MED ORDER — HYDROCHLOROTHIAZIDE 12.5 MG PO TABS
12.5000 mg | ORAL_TABLET | Freq: Every day | ORAL | Status: DC
  Administered 2024-04-09 – 2024-04-12 (×4): 12.5 mg via ORAL
  Filled 2024-04-09 (×4): qty 1

## 2024-04-09 MED ORDER — PREGABALIN 75 MG PO CAPS
75.0000 mg | ORAL_CAPSULE | Freq: Two times a day (BID) | ORAL | Status: DC
  Administered 2024-04-09 – 2024-04-12 (×6): 75 mg via ORAL
  Filled 2024-04-09 (×6): qty 1

## 2024-04-09 MED ORDER — OXYCODONE-ACETAMINOPHEN 5-325 MG PO TABS
1.0000 | ORAL_TABLET | Freq: Four times a day (QID) | ORAL | Status: DC | PRN
  Administered 2024-04-11 (×2): 1 via ORAL
  Filled 2024-04-09 (×2): qty 1

## 2024-04-09 MED ORDER — ALBUTEROL SULFATE (2.5 MG/3ML) 0.083% IN NEBU
2.5000 mg | INHALATION_SOLUTION | Freq: Once | RESPIRATORY_TRACT | Status: AC
  Administered 2024-04-09: 2.5 mg via RESPIRATORY_TRACT
  Filled 2024-04-09: qty 3

## 2024-04-09 MED ORDER — METFORMIN HCL ER 500 MG PO TB24
500.0000 mg | ORAL_TABLET | Freq: Two times a day (BID) | ORAL | Status: DC
  Administered 2024-04-10: 500 mg via ORAL
  Filled 2024-04-09: qty 1

## 2024-04-09 MED ORDER — METHYLPREDNISOLONE SODIUM SUCC 125 MG IJ SOLR
125.0000 mg | Freq: Once | INTRAMUSCULAR | Status: AC
  Administered 2024-04-09: 125 mg via INTRAVENOUS
  Filled 2024-04-09: qty 2

## 2024-04-09 MED ORDER — INSULIN ASPART 100 UNIT/ML IJ SOLN
0.0000 [IU] | Freq: Three times a day (TID) | INTRAMUSCULAR | Status: DC
  Administered 2024-04-09: 3 [IU] via SUBCUTANEOUS
  Administered 2024-04-10 (×2): 5 [IU] via SUBCUTANEOUS
  Administered 2024-04-10 – 2024-04-11 (×2): 2 [IU] via SUBCUTANEOUS
  Administered 2024-04-11: 5 [IU] via SUBCUTANEOUS
  Administered 2024-04-11: 15 [IU] via SUBCUTANEOUS
  Administered 2024-04-12: 3 [IU] via SUBCUTANEOUS
  Filled 2024-04-09 (×8): qty 1

## 2024-04-09 MED ORDER — METOPROLOL SUCCINATE ER 50 MG PO TB24
50.0000 mg | ORAL_TABLET | Freq: Once | ORAL | Status: AC
  Administered 2024-04-09: 50 mg via ORAL
  Filled 2024-04-09: qty 1

## 2024-04-09 MED ORDER — CYCLOBENZAPRINE HCL 10 MG PO TABS: 10.0000 mg | ORAL_TABLET | Freq: Every evening | ORAL | Status: DC | PRN

## 2024-04-09 MED ORDER — IPRATROPIUM-ALBUTEROL 0.5-2.5 (3) MG/3ML IN SOLN
3.0000 mL | Freq: Once | RESPIRATORY_TRACT | Status: AC
  Administered 2024-04-09: 3 mL via RESPIRATORY_TRACT
  Filled 2024-04-09: qty 3

## 2024-04-09 MED ORDER — ASPIRIN 81 MG PO TBEC
81.0000 mg | DELAYED_RELEASE_TABLET | Freq: Every day | ORAL | Status: DC
  Administered 2024-04-09 – 2024-04-11 (×3): 81 mg via ORAL
  Filled 2024-04-09 (×4): qty 1

## 2024-04-09 MED ORDER — ATORVASTATIN CALCIUM 20 MG PO TABS
10.0000 mg | ORAL_TABLET | Freq: Every day | ORAL | Status: DC
  Administered 2024-04-09 – 2024-04-12 (×4): 10 mg via ORAL
  Filled 2024-04-09 (×4): qty 1

## 2024-04-09 MED ORDER — HYDRALAZINE HCL 20 MG/ML IJ SOLN: 5.0000 mg | Freq: Four times a day (QID) | INTRAMUSCULAR | Status: DC | PRN

## 2024-04-09 MED ORDER — IPRATROPIUM-ALBUTEROL 0.5-2.5 (3) MG/3ML IN SOLN
3.0000 mL | RESPIRATORY_TRACT | Status: DC
  Administered 2024-04-09 (×2): 3 mL via RESPIRATORY_TRACT
  Filled 2024-04-09 (×2): qty 3

## 2024-04-09 MED ORDER — IPRATROPIUM-ALBUTEROL 0.5-2.5 (3) MG/3ML IN SOLN
3.0000 mL | Freq: Three times a day (TID) | RESPIRATORY_TRACT | Status: DC
  Administered 2024-04-10 – 2024-04-12 (×7): 3 mL via RESPIRATORY_TRACT
  Filled 2024-04-09 (×7): qty 3

## 2024-04-09 NOTE — ED Notes (Signed)
 Clarine, MD, made aware of BP 180/99

## 2024-04-09 NOTE — ED Notes (Signed)
 Pt ambulated to bedside commode with assistance from this RN. Upon returning to bed, pt experienced increased WOB and SOB. Clarine, MD, made aware.

## 2024-04-09 NOTE — ED Triage Notes (Signed)
 Pt to ED via [facility] from for c/o difficulty breathing. Upon EMS arrival, pt O2 in 70s, placed on non-rebreather. Pt on 4L nasal cannula on arrival. O2 98%. Pt has hx of COPD, on 3L [place] chronically. Pt has pacemaker.

## 2024-04-09 NOTE — H&P (Addendum)
 History and Physical    Angela Jensen FMW:[id number] DOB: 25-Jan-1956 DOA: 04/09/2024  PCP: Liana Fish, NP (Confirm with patient/family/NH records and if not entered, this has to be entered at [facility] point of entry) Patient coming from: Home  I have personally briefly reviewed patient's old medical records in [facility]  Chief Complaint: SOB  HPI: Angela Jensen is a 68 y.o. female with medical history significant of COPD Gold stage IV, chronic hypoxic respiratory failure on 4 L continuously, right-sided lung cancer status post radiation therapy, SSS status post pacemaker, chronic HFpEF, HTN, IDDM, presented with worsening of wheezing and shortness of breath.  Symptoms started 2 days ago, patient started to experience worsening of exertional dyspnea despite on oxygen .  She found only walking in side of her house causes significant shortness of breath.  Denies any chest pain no cough no fever or chills.  Patient has been followed with pulmonology for advanced lung problems and most recent PFT showed FEV1 24%, and patient was last seen by pulmonary 1 month ago when pulmonologist started patient on MWF Bactrim  as well as daily Decadron  to prevent future pneumonia and COPD exacerbation.  Despite the patient came back today for shortness of breath.  ED Course: Afebrile, nontachycardic blood pressure 180/80 O2 saturation 99% on 4 L.  Chest x-ray negative for acute findings, VBG showed 732/58/94, BUN 19 creatinine 0.5 bicarb 25, glucose 188 WBC 6.0.  Patient was given IV Solu-Medrol  and breathing treatment x 2 however continued experience dyspnea with short ambulating.  Review of Systems: As per HPI otherwise 14 point review of systems negative.    Past Medical History:  Diagnosis Date   Anemia    Arthritis    Asthma    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/2016   Cancer (HCC) 2012   Right Lung CA   COPD (chronic obstructive pulmonary  disease) (HCC)    Depression    Diabetes mellitus without complication (HCC)    Patient takes Janumet   Essential hypertension 05/26/2016   Heart failure (HCC) 2022   Hydropneumothorax 05/03/2020   Hypercholesteremia    Oxygen  dependent    2L at nite    PAD (peripheral artery disease) (HCC) 06/22/2016   Peripheral vascular disease (HCC)    Personal history of radiation therapy    Shortness of breath dyspnea    with exertion    Sialolithiasis    Sleep apnea    Wears dentures    full upper and lower    Past Surgical History:  Procedure Laterality Date   APPLICATION OF CELL SAVER Left 01/21/2024   Procedure: APPLICATION OF CELL SAVER;  Surgeon: Marea Selinda RAMAN, MD;  Location: [facility];  Service: Vascular;  Laterality: Left;   CESAREAN SECTION     x3   COLONOSCOPY N/A 04/03/2024   Procedure: COLONOSCOPY;  Surgeon: Jinny Carmine, MD;  Location: [facility];  Service: Endoscopy;  Laterality: N/A;   COLONOSCOPY WITH PROPOFOL  N/A 06/25/2015   Procedure: COLONOSCOPY WITH PROPOFOL ;  Surgeon: Carmine Jinny, MD;  Location: [facility];  Service: Endoscopy;  Laterality: N/A;   COLONOSCOPY WITH PROPOFOL  N/A 07/26/2020   Procedure: COLONOSCOPY WITH PROPOFOL ;  Surgeon: Jinny Carmine, MD;  Location: [facility];  Service: Endoscopy;  Laterality: N/A;   CYST REMOVAL LEG     and on shoulder    ENDARTERECTOMY FEMORAL Left 01/21/2024   Procedure: LEFT COMMON, SUPERFICIAL FEMORAL AND PROFUNDA FEMORIS ENDARTERECTOMY WITH BOVINE PERICARDIAL PATCH ANGIOPLASTY;  Surgeon: Marea Selinda RAMAN, MD;  Location: [facility];  Service: Vascular;  Laterality: Left;   ESOPHAGOGASTRODUODENOSCOPY (EGD) WITH PROPOFOL  N/A 07/26/2020   Procedure: ESOPHAGOGASTRODUODENOSCOPY (EGD) WITH PROPOFOL ;  Surgeon: Jinny Carmine, MD;  Location: [facility];  Service: Endoscopy;  Laterality: N/A;  Diabetic - oral meds   INSERTION OF ILIAC STENT Left 01/21/2024   Procedure: INSERTION, STENT, ARTERY, ILIAC;  Surgeon: Marea Selinda RAMAN,  MD;  Location: [facility];  Service: Vascular;  Laterality: Left;  AND SFA STENTS   LOWER EXTREMITY ANGIOGRAPHY Left 09/29/2018   Procedure: LOWER EXTREMITY ANGIOGRAPHY;  Surgeon: Marea Selinda RAMAN, MD;  Location: [facility];  Service: Cardiovascular;  Laterality: Left;   LOWER EXTREMITY ANGIOGRAPHY Left 07/29/2023   Procedure: Lower Extremity Angiography;  Surgeon: Marea Selinda RAMAN, MD;  Location: [facility];  Service: Cardiovascular;  Laterality: Left;   LOWER EXTREMITY ANGIOGRAPHY Right 12/20/2023   Procedure: Lower Extremity Angiography;  Surgeon: Marea Selinda RAMAN, MD;  Location: [facility];  Service: Cardiovascular;  Laterality: Right;   LOWER EXTREMITY ANGIOGRAPHY Left 01/17/2024   Procedure: Lower Extremity Angiography;  Surgeon: Marea Selinda RAMAN, MD;  Location: [facility];  Service: Cardiovascular;  Laterality: Left;   LUNG BIOPSY  12/30/2011   has lung spots   PACEMAKER IMPLANT  07/14/2021   PACEMAKER LEADLESS INSERTION N/A 07/14/2021   Procedure: PACEMAKER LEADLESS INSERTION;  Surgeon: Ammon Blunt, MD;  Location: [facility];  Service: Cardiovascular;  Laterality: N/A;   PERIPHERAL VASCULAR CATHETERIZATION Left 06/01/2016   Procedure: Lower Extremity Angiography;  Surgeon: Selinda RAMAN Marea, MD;  Location: [facility];  Service: Cardiovascular;  Laterality: Left;   PERIPHERAL VASCULAR CATHETERIZATION N/A 06/01/2016   Procedure: Abdominal Aortogram w/Lower Extremity;  Surgeon: Selinda RAMAN Marea, MD;  Location: [facility];  Service: Cardiovascular;  Laterality: N/A;   PERIPHERAL VASCULAR CATHETERIZATION  06/01/2016   Procedure: Lower Extremity Intervention;  Surgeon: Selinda RAMAN Marea, MD;  Location: [facility];  Service: Cardiovascular;;   PERIPHERAL VASCULAR CATHETERIZATION Right 06/08/2016   Procedure: Lower Extremity Angiography;  Surgeon: Selinda RAMAN Marea, MD;  Location: [facility];  Service: Cardiovascular;  Laterality:  Right;   PERIPHERAL VASCULAR CATHETERIZATION  06/08/2016   Procedure: Lower Extremity Intervention;  Surgeon: Selinda RAMAN Marea, MD;  Location: [facility];  Service: Cardiovascular;;   POLYPECTOMY  04/03/2024   Procedure: POLYPECTOMY, INTESTINE;  Surgeon: Jinny Carmine, MD;  Location: [facility];  Service: Endoscopy;;   SUBMANDIBULAR GLAND EXCISION Left 12/06/2020   Procedure: EXCISION SUBMANDIBULAR GLAND;  Surgeon: Herminio Miu, MD;  Location: [facility];  Service: ENT;  Laterality: Left;  needs to be first case Diabetic - diet controlled   TEMPORARY PACEMAKER N/A 07/11/2021   Procedure: TEMPORARY PACEMAKER;  Surgeon: Ammon Blunt, MD;  Location: [facility];  Service: Cardiovascular;  Laterality: N/A;     reports that she quit smoking about 14 years ago. Her smoking use included cigarettes. She started smoking about 51 years ago. She has a 37 pack-year smoking history. She has quit using smokeless tobacco.  Her smokeless tobacco use included snuff. She reports that she does not currently use alcohol after a past usage of about 5.0 standard drinks of alcohol per week. She reports that she does not currently use drugs after having used the following drugs: Marijuana, Crack cocaine, and Cocaine.  Allergies  Allergen Reactions   Iodinated Contrast Media Itching   Trelegy Ellipta  [  Fluticasone -Umeclidin-Vilant]     Had breathing issues   Bactoshield Chg [Chlorhexidine  Gluconate] Itching and Other (See Comments)    Dry. Flaking, peeling skin    Family History  Problem Relation Age of Onset   Diabetes Mother    Hypercholesterolemia Mother    Lung cancer Father    Diabetes Sister    Diabetes Sister    Hypertension Sister    Diabetes Maternal Grandmother    Diabetes Paternal Grandmother    Heart attack Brother    Coronary artery disease Brother    Vascular Disease Brother    Heart attack Brother    Breast cancer Neg Hx      Prior to Admission  medications   Medication Sig Start Date End Date Taking? Authorizing Provider  ACCU-CHEK GUIDE TEST test strip USE TO TEST BLOOD SUGAR 5 TIMES DAILY 09/21/23   Liana Fish, NP  Accu-Chek Softclix Lancets lancets Use 1 lancet 3 times daily to check glucose for diabetes 01/19/23   Liana Fish, NP  albuterol  (PROVENTIL ) (5 MG/ML) 0.5% nebulizer solution Take 2.5 mg by nebulization every 6 (six) hours as needed for wheezing or shortness of breath.    [provider]  albuterol  (VENTOLIN  HFA) 108 (90 Base) MCG/ACT inhaler INHALE 2 PUFFS BY MOUTH EVERY 6 HOURS AS NEEDED FOR WHEEZING OR SHORTNESS OF BREATH 10/13/23   Liana Fish, NP  ALPRAZolam  (XANAX ) 0.5 MG tablet Take 1 tablet (0.5 mg total) by mouth 2 (two) times daily as needed for anxiety. 04/05/24   Liana Fish, NP  aspirin  EC 81 MG tablet Take 1 tablet (81 mg total) by mouth daily. Swallow whole. 07/29/23 07/28/24  Marea Selinda RAMAN, MD  atorvastatin  (LIPITOR) 10 MG tablet Take 1 tablet (10 mg total) by mouth daily. 07/29/23 07/28/24  Marea Selinda RAMAN, MD  Blood Glucose Monitoring Suppl (ACCU-CHEK GUIDE) w/Device KIT Use as directed Dx e11.65 04/11/21   Khan, Fozia M, MD  calcium  carbonate (OSCAL) 1500 (600 Ca) MG TABS tablet Take 600 mg of elemental calcium  by mouth 2 (two) times daily with a meal.    [provider]  clopidogrel  (PLAVIX ) 75 MG tablet Take 1 tablet (75 mg total) by mouth daily. 10/13/23   Liana Fish, NP  cyclobenzaprine  (FLEXERIL ) 10 MG tablet Take 1 tablet (10 mg total) by mouth at bedtime. Take one tab po qhs for back spasm prn only Patient taking differently: Take 10 mg by mouth at bedtime as needed for muscle spasms. Take one tab po qhs for back spasm prn only 10/13/23   Liana Fish, NP  dexamethasone  (DECADRON ) 1 MG tablet Take 1 mg by mouth. 03/08/24 06/06/24  [provider]  escitalopram  (LEXAPRO ) 10 MG tablet Take 1 tablet (10 mg total) by mouth daily. 08/12/23   Liana Fish, NP  ferrous sulfate  325 (65 FE) MG tablet Take 325 mg by mouth 2 (two) times daily with a meal.    [provider]  furosemide  (LASIX ) 20 MG tablet Take 1 tablet (20 mg total) by mouth daily as needed. 04/27/23   Abernathy, Alyssa, NP  insulin  glargine, 1 Unit Dial , (TOUJEO ) 300 UNIT/ML Solostar Pen Inject 10 Units into the skin daily. 07/27/23   Liana Fish, NP  Insulin  Pen Needle (RELION PEN NEEDLES) 31G X 6 MM MISC USE 1 PEN NEEDLE WITH INSULIN  PEN ONCE DAILY FOR DIABETES 05/05/23   Liana Fish, NP  ipratropium-albuterol  (DUONEB) 0.5-2.5 (3) MG/3ML SOLN Take 3 mLs by nebulization every 6 (six) hours as needed. 03/06/24  Liana Fish, NP  losartan  (COZAAR ) 25 MG tablet Take 0.5 tablets (12.5 mg total) by mouth daily. 03/25/24 04/24/24  Dezii, Alexandra, DO  metFORMIN  (GLUCOPHAGE -XR) 500 MG 24 hr tablet TAKE 2 TABLETS BY MOUTH ONCE DAILY WITH BREAKFAST 01/17/24   Abernathy, Alyssa, NP  metoprolol  succinate (TOPROL -XL) 50 MG 24 hr tablet Take 1 tablet (50 mg total) by mouth daily. Take with or immediately following a meal. 03/01/24   Liana Fish, NP  mometasone -formoterol  (DULERA ) 200-5 MCG/ACT AERO Inhale 2 puffs by mouth twice daily 07/18/23   Khan, Fozia M, MD  oxyCODONE -acetaminophen  (PERCOCET/ROXICET) 5-325 MG tablet Take 1 tablet by mouth every 6 (six) hours as needed for severe pain (pain score 7-10). 03/20/24   Liana Fish, NP  OXYGEN  Inhale 4 L into the lungs. PT USES [facility] FOR OXYGEN     [provider]  pantoprazole  (PROTONIX ) 40 MG tablet Take 1 tablet (40 mg total) by mouth daily. 04/03/24 04/03/25  Lenon Marien CROME, MD  potassium chloride  (KLOR-CON ) 10 MEQ tablet TAKE 1 TABLET BY MOUTH EVERY OTHER DAY 03/24/24   Liana Fish, NP  pregabalin  (LYRICA ) 75 MG capsule Take 1 capsule (75 mg total) by mouth 2 (two) times daily. 03/06/24   Brown, Fallon E, NP  SPIRIVA  HANDIHALER 18 MCG inhalation capsule INHALE THE CONTENTS OF 1 CAPSULES BY  MOUTH ONCE DAILY - DO NOT SWALLOW CAPSULES 06/28/23   McDonough, Tinnie POUR, PA-C  sulfamethoxazole -trimethoprim  (BACTRIM ) 400-80 MG tablet Take by mouth. 03/10/24 04/18/24  [provider]  vitamin B-12 (CYANOCOBALAMIN ) 100 MCG tablet Take 100 mcg by mouth daily.    [provider]  VITAMIN D , CHOLECALCIFEROL , PO Take 1 tablet by mouth in the morning.    [provider]    Physical Exam: Vitals:   04/09/24 0817 04/09/24 0820 04/09/24 0930 04/09/24 1230  BP: (!) 174/87  (!) 180/99 (!) 161/91  Pulse: 76  77 82  Resp: 18  20 11   Temp: 98 F (36.7 C)     TempSrc: Oral     SpO2: 99%  100% 100%  Weight:  53.1 kg    Height:  4' 11 (1.499 m)      Constitutional: NAD, calm, comfortable Vitals:   04/09/24 0817 04/09/24 0820 04/09/24 0930 04/09/24 1230  BP: (!) 174/87  (!) 180/99 (!) 161/91  Pulse: 76  77 82  Resp: 18  20 11   Temp: 98 F (36.7 C)     TempSrc: Oral     SpO2: 99%  100% 100%  Weight:  53.1 kg    Height:  4' 11 (1.499 m)     Eyes: PERRL, lids and conjunctivae normal ENMT: Mucous membranes are moist. Posterior pharynx clear of any exudate or lesions.Normal dentition.  Neck: normal, supple, no masses, no thyromegaly Respiratory: Diminished breathing sound bilaterally, scattered wheezing no crackles, increasing respiratory effort. No accessory muscle use.  Cardiovascular: Regular rate and rhythm, no murmurs / rubs / gallops. No extremity edema. 2+ pedal pulses. No carotid bruits.  Abdomen: no tenderness, no masses palpated. No hepatosplenomegaly. Bowel sounds positive.  Musculoskeletal: no clubbing / cyanosis. No joint deformity upper and lower extremities. Good ROM, no contractures. Normal muscle tone.  Skin: no rashes, lesions, ulcers. No induration Neurologic: CN 2-12 grossly intact. Sensation intact, DTR normal. Strength 5/5 in all 4.  Psychiatric: Normal judgment and insight. Alert and oriented x 3. Normal mood.    Labs on Admission: I have  personally reviewed following labs and imaging studies  CBC:  Recent Labs  Lab 04/03/24 0548 04/09/24 0834  WBC 6.1 6.0  NEUTROABS  --  4.3  HGB 9.1* 9.6*  HCT 29.9* 33.6*  MCV 94.0 98.5  PLT 224 201   Basic Metabolic Panel: Recent Labs  Lab 04/09/24 0834  NA 140  K 4.2  CL 104  CO2 27  GLUCOSE 188*  BUN 19  CREATININE 0.57  CALCIUM  8.1*   GFR: Estimated Creatinine Clearance: 50.2 mL/min (by C-G formula based on SCr of 0.57 mg/dL). Liver Function Tests: No results for input(s): AST, ALT, ALKPHOS, BILITOT, PROT, ALBUMIN in the last 168 hours. No results for input(s): LIPASE, AMYLASE in the last 168 hours. No results for input(s): AMMONIA in the last 168 hours. Coagulation Profile: No results for input(s): INR, PROTIME in the last 168 hours. Cardiac Enzymes: No results for input(s): CKTOTAL, CKMB, CKMBINDEX, TROPONINI in the last 168 hours. BNP (last 3 results) No results for input(s): PROBNP in the last 8760 hours. HbA1C: No results for input(s): HGBA1C in the last 72 hours. CBG: Recent Labs  Lab 04/03/24 1049  GLUCAP 124*   Lipid Profile: No results for input(s): CHOL, HDL, LDLCALC, TRIG, CHOLHDL, LDLDIRECT in the last 72 hours. Thyroid  Function Tests: No results for input(s): TSH, T4TOTAL, FREET4, T3FREE, THYROIDAB in the last 72 hours. Anemia Panel: No results for input(s): VITAMINB12, FOLATE, FERRITIN, TIBC, IRON , RETICCTPCT in the last 72 hours. Urine analysis:    Component Value Date/Time   COLORURINE STRAW (A) 04/17/2023 0930   APPEARANCEUR CLEAR (A) 04/17/2023 0930   APPEARANCEUR Clear 08/06/2022 1541   LABSPEC 1.014 04/17/2023 0930   PHURINE 6.0 04/17/2023 0930   GLUCOSEU NEGATIVE 04/17/2023 0930   HGBUR MODERATE (A) 04/17/2023 0930   BILIRUBINUR NEGATIVE 04/17/2023 0930   BILIRUBINUR Negative 08/06/2022 1541   KETONESUR NEGATIVE 04/17/2023 0930   PROTEINUR NEGATIVE 04/17/2023  0930   UROBILINOGEN 0.2 04/22/2021 0959   NITRITE NEGATIVE 04/17/2023 0930   LEUKOCYTESUR NEGATIVE 04/17/2023 0930    Radiological Exams on Admission: DG Chest Portable 1 View Result Date: 04/09/2024 CLINICAL DATA:  Shortness of breath.  COPD.  Hypoxia. EXAM: PORTABLE CHEST 1 VIEW COMPARISON:  03/31/2024 FINDINGS: Stable mild cardiomegaly. Pulmonary hyperinflation is seen, consistent with COPD. Airspace opacity in the right lower lobe and small right pleural effusion show no significant change. No new or worsening areas of opacity are seen. IMPRESSION: No significant change in right lower lobe airspace opacity and small right pleural effusion, suspicious for pneumonia. COPD. Electronically Signed   By: Norleen DELENA Kil M.D.   On: 04/09/2024 09:57    EKG: Independently reviewed.  Ventricular paced  Assessment/Plan Principal Problem:   COPD (chronic obstructive pulmonary disease) (HCC) Active Problems:   Acute exacerbation of chronic obstructive pulmonary disease (COPD) (HCC)  (please populate well all problems here in Problem List. (For example, if patient is on BP meds at home and you resume or decide to hold them, it is a problem that needs to be her. Same for CAD, COPD, HLD and so on)  Acute COPD exacerbation, Gold stage IV Acute on chronic hypoxic respiratory failure Chronic radiation lung fibrosis -Appears that the patient's pulmonology already started patient on prophylactic antibiotics and steroid since last month, despite patient coming with breakthrough COPD exacerbation.  Change antibiotic coverage from Bactrim  to azithromycin  to cover more atypical infections.  Check atypical pneumonia study Legionella and mycoplasma -Escalate to IV Solu-Medrol  twice daily -Continue ICS and LABA -DuoNebs and as needed albuterol  -Incentive spirometry -Explained to the patient  and her daughter at bedside that patient has end-stage of COPD, long-term prognosis is poor, recommend follow-up with  pulmonary to discuss about palliative care if not a candidate for more advanced therapies such as lung transplant.  Uncontrolled HTN Chronic HFpEF -Continue home BP meds including metoprolol  and losartan , add hydrochlorothiazide   IDDM -SSI - Continue metformin   History of PAD - No acute concern, continue aspirin  Plavix   Total time spent on patient care 55 minutes.  DVT prophylaxis: Lovenox  Code Status: Full code Family Communication: Daughter at bedside Disposition Plan: Expect less than 2 midnight hospital stay Consults called: None Admission status: Demetre observation   Cort ONEIDA Mana MD [facility] Pager [phone]  04/09/2024, 2:46 PM

## 2024-04-09 NOTE — ED Provider Notes (Signed)
 [facility] Provider Note    Event Date/Time   First MD Initiated Contact with Patient 04/09/24 [phone]     (approximate)   History   Shortness of Breath  Pt to ED via [facility] from for c/o difficulty breathing. Upon EMS arrival, pt O2 in 70s, placed on non-rebreather. Pt on 4L nasal cannula on arrival. O2 98%. Pt has hx of COPD, on 3L [place] chronically. Pt has pacemaker.   HPI Angela Jensen is a 68 y.o. female PMH multiple medical comorbidities including COPD on 4 L home oxygen , right lung cancer s/p radiation therapy, history of pneumothorax, sick sinus syndrome status post pacemaker, HFpEF, hypertension, diabetes, PAD, pneumonia presents for evaluation of shortness of breath - Patient uses CPAP at night, not she woke up this morning feeling short of breath and wheezing.  No significant cough, no fevers. - Has been taking all of her medications as prescribed.   - Notes she used an inhaler at home and feels better though feels she is still wheezing. - No medications en route by EMS - Confirms she is usually on 3-4 L nasal cannula at home - Denies any significant recurrent rectal bleeding (see recent hospitalization summary below) - Was reportedly hypoxic to the 70s on EMS arrival  Per chart review, last admitted earlier this month due to concern for HCAP as well as hematochezia.  Admitted 8/14, discharged on 04/03/2024.  Appears patient underwent colonoscopy during hospitalization, impression and recommendation below: Ultimately felt to primarily have COPD exacerbation without recurrent pneumonia, was initially treated with Vanco and Zosyn  which were discontinued.  Pro-Cal less than 0.1.    Impression:            - One 2 mm polyp in the descending colon, removed with                         a cold biopsy forceps. Resected and retrieved.                        - One 6 mm polyp in the ascending colon, removed with                         a cold snare. Resected and  retrieved.                        - One 3 mm polyp in the transverse colon, removed with                         a cold snare. Resected and retrieved.                        - Non-bleeding internal hemorrhoids.                        - Diverticulosis in the entire examined colon.                        - Bleeding likely diverticular vs hemorrhoidal Recommendation:        - Return patient to hospital ward for ongoing care.                        - Resume previous diet.                        -  Continue present medications.                        - Await pathology results.                        - If any further bleeding then a bleeding scan or CT                         angio for the source with follow up vascular surgery                         vs surgical treatment.     Physical Exam   Triage Vital Signs: BP (!) 161/91   Pulse 82   Temp 98 F (36.7 C) (Oral)   Resp 11   Ht 4' 11 (1.499 m)   Wt 53.1 kg   SpO2 100%   BMI 23.63 kg/m     Most recent vital signs: Vitals:   04/09/24 0930 04/09/24 1230  BP: (!) 180/99 (!) 161/91  Pulse: 77 82  Resp: 20 11  Temp:    SpO2: 100% 100%     General: Awake, no distress.  CV:  Good peripheral perfusion. RRR, RP 2+ Resp:  Tachypneic at rest though able to speak full sentences.  Decreased flow throughout, end expiratory wheezing throughout.   Abd:  No distention. Nontender to deep palpation throughout Other:  No lower extremity edema/swelling   ED Results / Procedures / Treatments   Labs (all labs ordered are listed, but only abnormal results are displayed) Labs Reviewed  BASIC METABOLIC PANEL WITH GFR - Abnormal; Notable for the following components:      Result Value   Glucose, Bld 188 (*)    Calcium  8.1 (*)    All other components within normal limits  CBC WITH DIFFERENTIAL/PLATELET - Abnormal; Notable for the following components:   RBC 3.41 (*)    Hemoglobin 9.6 (*)    HCT 33.6 (*)    MCHC 28.6 (*)    All other  components within normal limits  BRAIN NATRIURETIC PEPTIDE - Abnormal; Notable for the following components:   B Natriuretic Peptide 259.4 (*)    All other components within normal limits  BLOOD GAS, VENOUS - Abnormal; Notable for the following components:   pO2, Ven 94 (*)    Bicarbonate 29.9 (*)    Acid-Base Excess 2.4 (*)    All other components within normal limits  RESP PANEL BY RT-PCR (RSV, FLU A&B, COVID)  [id number]  TROPONIN I (HIGH SENSITIVITY)  TROPONIN I (HIGH SENSITIVITY)     EKG  See ED course below   RADIOLOGY Radiology interpreted by myself and radiology report reviewed.  No obvious acute pathology identified--similar redemonstration of right airspace opacity --not felt to be genuine pneumonia on recent admission.    PROCEDURES:  Critical Care performed: Yes, see critical care procedure note(s)  .Critical Care  Performed by: Clarine Ozell LABOR, MD Authorized by: Clarine Ozell LABOR, MD   Critical care provider statement:    Critical care time (minutes):  30   Critical care time was exclusive of:  Separately billable procedures and treating other patients   Critical care was necessary to treat or prevent imminent or life-threatening deterioration of the following conditions:  Respiratory failure   Critical care was time spent personally by me on the following activities:  Development  of treatment plan with patient or surrogate, discussions with consultants, evaluation of patient's response to treatment, examination of patient, ordering and review of laboratory studies, ordering and review of radiographic studies, ordering and performing treatments and interventions, pulse oximetry, re-evaluation of patient's condition and review of old charts   I assumed direction of critical care for this patient from another provider in my specialty: no     Care discussed with: admitting provider      MEDICATIONS ORDERED IN ED: Medications  ipratropium-albuterol  (DUONEB) 0.5-2.5 (3)  MG/3ML nebulizer solution 3 mL (3 mLs Nebulization Given 04/09/24 0845)  ipratropium-albuterol  (DUONEB) 0.5-2.5 (3) MG/3ML nebulizer solution 3 mL (3 mLs Nebulization Given 04/09/24 0845)  methylPREDNISolone  sodium succinate (SOLU-MEDROL ) 125 mg/2 mL injection 125 mg (125 mg Intravenous Given 04/09/24 0845)  albuterol  (PROVENTIL ) (2.5 MG/3ML) 0.083% nebulizer solution 2.5 mg (2.5 mg Nebulization Given 04/09/24 0945)  losartan  (COZAAR ) tablet 12.5 mg (12.5 mg Oral Given 04/09/24 1018)  metoprolol  succinate (TOPROL -XL) 24 hr tablet 50 mg (50 mg Oral Given 04/09/24 0945)     IMPRESSION / MDM / ASSESSMENT AND PLAN / ED COURSE  I reviewed the triage vital signs and the nursing notes.                              DDX/MDM/AP: Differential diagnosis includes, but is not limited to, COPD exacerbation, consider underlying viral syndrome such as influenza or COVID-19, pneumonia, doubt ACS, do not clinically suspect PE at this time given obvious wheezing on initial exam.  Exam not consistent with CHF exacerbation.  Do not suspect arrhythmia or pacemaker etiology of presentation.  Satting well on 3 L nasal cannula currently.  Plan: - Labs - DuoNebs, Methylpred - Supplemental oxygen  - Chest x-ray - EKG - Reassess  Patient's presentation is most consistent with acute presentation with potential threat to life or bodily function.  The patient is on the cardiac monitor to evaluate for evidence of arrhythmia and/or significant heart rate changes.  ED course below.  Patient treated with serial rounds of nebulizers as well as IV steroids here with some improvement though she does remain complaining of ongoing shortness of breath.  Chest x-ray redemonstrating right sided opacity, not felt to be pneumonia on recent admission with normal Pro-Cal, no fever or white count elevation today-deferred antibiotics at this time.  Breathing and exam did certainly improve with treatment though remains very short of breath with  minimal ambulation and still has decreased airflow in bilateral lung bases.  Some evidence of possible mild heart failure contributing with elevated BNP however patient has already put out about a liter of urine here in emergency department without diuresis--Will defer further diuresis at this time.  Given ongoing symptomatology I did discuss with hospitalist who is amenable to admission.  Clinical Course as of 04/09/24 1411  Sun Apr 09, 2024  0841 Ecg = heart rate 81, ventricularly paced rhythm, no gross ST elevation or depression.  No clear evidence of ischemia on my interpretation.  Similar to prior from 03/31/2024. [MM]  [phone] Bedside nurse reports patient became notably tachypneic and short of breath with minimal ambulation to the bathroom, will give further nebulizer treatment [MM]  0934 CBC with no leukocytosis, stable anemia BMP reviewed, unremarkable Troponin normal [MM]  1012 BNP mildly elevated [MM]  1012 CXR: IMPRESSION: No significant change in right lower lobe airspace opacity and small right pleural effusion, suspicious for pneumonia.   [MM]  1108  Viral swab negative [MM]  1222 Patient reevaluated, remains satting well on her usual 3-4 L nasal cannula.  Work of breathing improved, no persistent wheezing appreciated on reevaluation. [MM]  1307 Patient reevaluated, findings discussed.  Has had good urine output here without diuresis.  Remains satting well on her usual nasal cannula.  Will do short ambulation trial to see how she tolerates minimal activity. [MM]  1359 Ambulation trial with severe shortness of breath.  Patient notes that she continues to feel short of breath and is not comfortable going home.  Does not appear that she would be able to meet her activities of daily living despite having home oxygen  given her notable ongoing shortness of breath that I believe is due to a COPD exacerbation.  Will discuss with hospitalist, hospitalist consult order placed. [MM]     Clinical Course User Index [MM] Clarine Ozell LABOR, MD     FINAL CLINICAL IMPRESSION(S) / ED DIAGNOSES   Final diagnoses:  COPD exacerbation (HCC)     Rx / DC Orders   ED Discharge Orders     None        Note:  This document was prepared using Dragon voice recognition software and may include unintentional dictation errors.   Clarine Ozell LABOR, MD 04/09/24 [phone]

## 2024-04-10 ENCOUNTER — Inpatient Hospital Stay: Payer: 59 | Attending: Oncology

## 2024-04-10 DIAGNOSIS — J440 Chronic obstructive pulmonary disease with acute lower respiratory infection: Secondary | ICD-10-CM | POA: Diagnosis not present

## 2024-04-10 LAB — GLUCOSE, CAPILLARY
Glucose-Capillary: 143 mg/dL — ABNORMAL HIGH (ref 70–99)
Glucose-Capillary: 214 mg/dL — ABNORMAL HIGH (ref 70–99)
Glucose-Capillary: 236 mg/dL — ABNORMAL HIGH (ref 70–99)
Glucose-Capillary: 227 mg/dL — ABNORMAL HIGH (ref 70–99)

## 2024-04-10 MED ORDER — FERROUS SULFATE 325 (65 FE) MG PO TABS
325.0000 mg | ORAL_TABLET | Freq: Two times a day (BID) | ORAL | Status: DC
  Administered 2024-04-10 – 2024-04-12 (×4): 325 mg via ORAL
  Filled 2024-04-10 (×4): qty 1

## 2024-04-10 MED ORDER — CALCIUM CARBONATE 1250 (500 CA) MG PO TABS
500.0000 mg | ORAL_TABLET | Freq: Two times a day (BID) | ORAL | Status: DC
  Administered 2024-04-10 – 2024-04-12 (×4): 1250 mg via ORAL
  Filled 2024-04-10 (×6): qty 1

## 2024-04-10 MED ORDER — VITAMIN B-12 100 MCG PO TABS
100.0000 ug | ORAL_TABLET | Freq: Every day | ORAL | Status: DC
  Administered 2024-04-10 – 2024-04-12 (×3): 100 ug via ORAL
  Filled 2024-04-10 (×3): qty 1

## 2024-04-10 NOTE — Progress Notes (Signed)
 PROGRESS NOTE    Angela Jensen  FMW:[id number] DOB: 10-27-1955 DOA: 04/09/2024 PCP: Liana Fish, NP   Assessment & Plan:   Principal Problem:   COPD (chronic obstructive pulmonary disease) (HCC) Active Problems:   Acute exacerbation of chronic obstructive pulmonary disease (COPD) (HCC)  Assessment and Plan: Acute COPD exacerbation: continue on azithromycin , steroids, bronchodilators & encourage incentive spirometry. Legionella ordered.   Acute on chronic hypoxic respiratory failure: continue on supplemental oxygen  and wean back to baseline as tolerated. Hx of chronic radiation lung fibrosis    HTN: poorly controlled. Continue on home dose of metoprolol , losartan , hydrochlorothiazide   Acute on chronic diastolic CHF: appears compensated. Monitor I/Os. Continue on home dose of metoprolol , losartan     DM1: continue on SSI w/ accuchecks  Hx of PAD: continue on home dose of plavix , aspirin        DVT prophylaxis: lovenox   Code Status: full Family Communication: discussed pt's care w/ pt's family at bedside and answered their questions Disposition Plan: depends on PT/OT recs   Level of care: Telemetry Medical Consultants:    Procedures:   Antimicrobials: azithromycin     Subjective: Pt c/o shortness of breath.  Objective: Vitals:   04/10/24 0331 04/10/24 0743 04/10/24 0755 04/10/24 1216  BP: (!) 143/81  (!) 144/86 (!) 156/77  Pulse: 77  74 74  Resp:   16 18  Temp: 98.1 F (36.7 C)  98.2 F (36.8 C) 98.5 F (36.9 C)  TempSrc: Oral  Oral   SpO2: 97% 98% 97% 97%  Weight:      Height:       No intake or output data in the 24 hours ending 04/10/24 1242 Filed Weights   04/09/24 0820  Weight: 53.1 kg    Examination:  General exam: Appears calm and comfortable  Respiratory system: diminished breath sounds b/l  Cardiovascular system: S1 & S2+. No rubs, gallops or clicks.  Gastrointestinal system: Abdomen is nondistended, soft and nontender.  Normal bowel  sounds heard. Central nervous system: Alert and oriented. Moves all extremities  Psychiatry: Judgement and insight appear normal. Mood & affect appropriate.     Data Reviewed: I have personally reviewed following labs and imaging studies  CBC: Recent Labs  Lab 04/09/24 0834  WBC 6.0  NEUTROABS 4.3  HGB 9.6*  HCT 33.6*  MCV 98.5  PLT 201   Basic Metabolic Panel: Recent Labs  Lab 04/09/24 0834  NA 140  K 4.2  CL 104  CO2 27  GLUCOSE 188*  BUN 19  CREATININE 0.57  CALCIUM  8.1*   GFR: Estimated Creatinine Clearance: 50.2 mL/min (by C-G formula based on SCr of 0.57 mg/dL). Liver Function Tests: No results for input(s): AST, ALT, ALKPHOS, BILITOT, PROT, ALBUMIN in the last 168 hours. No results for input(s): LIPASE, AMYLASE in the last 168 hours. No results for input(s): AMMONIA in the last 168 hours. Coagulation Profile: No results for input(s): INR, PROTIME in the last 168 hours. Cardiac Enzymes: No results for input(s): CKTOTAL, CKMB, CKMBINDEX, TROPONINI in the last 168 hours. BNP (last 3 results) No results for input(s): PROBNP in the last 8760 hours. HbA1C: No results for input(s): HGBA1C in the last 72 hours. CBG: Recent Labs  Lab 04/09/24 1821 04/09/24 2119 04/10/24 0741 04/10/24 1219  GLUCAP 184* 219* 143* 214*   Lipid Profile: No results for input(s): CHOL, HDL, LDLCALC, TRIG, CHOLHDL, LDLDIRECT in the last 72 hours. Thyroid  Function Tests: No results for input(s): TSH, T4TOTAL, FREET4, T3FREE, THYROIDAB in the last 72  hours. Anemia Panel: No results for input(s): VITAMINB12, FOLATE, FERRITIN, TIBC, IRON , RETICCTPCT in the last 72 hours. Sepsis Labs: No results for input(s): PROCALCITON, LATICACIDVEN in the last 168 hours.  Recent Results (from the past 240 hours)  Resp panel by RT-PCR (RSV, Flu A&B, Covid) Anterior Nasal Swab     Status: None   Collection Time: 03/31/24 11:48  PM   Specimen: Anterior Nasal Swab  Result Value Ref Range Status   SARS Coronavirus 2 by RT PCR NEGATIVE NEGATIVE Final    Comment: (NOTE) SARS-CoV-2 target nucleic acids are NOT DETECTED.  The SARS-CoV-2 RNA is generally detectable in upper respiratory specimens during the acute phase of infection. The lowest concentration of SARS-CoV-2 viral copies this assay can detect is 138 copies/mL. A negative result does not preclude SARS-Cov-2 infection and should not be used as the sole basis for treatment or other patient management decisions. A negative result may occur with  improper specimen collection/handling, submission of specimen other than nasopharyngeal swab, presence of viral mutation(s) within the areas targeted by this assay, and inadequate number of viral copies(<138 copies/mL). A negative result must be combined with clinical observations, patient history, and epidemiological information. The expected result is Negative.  Fact Sheet for Patients:  BloggerCourse.com  Fact Sheet for Healthcare Providers:  SeriousBroker.it  This test is no t yet approved or cleared by the [place]  FDA and  has been authorized for detection and/or diagnosis of SARS-CoV-2 by FDA under an Emergency Use Authorization (EUA). This EUA will remain  in effect (meaning this test can be used) for the duration of the COVID-19 declaration under Section 564(b)(1) of the Act, 21 U.S.C.section 360bbb-3(b)(1), unless the authorization is terminated  or revoked sooner.       Influenza A by PCR NEGATIVE NEGATIVE Final   Influenza B by PCR NEGATIVE NEGATIVE Final    Comment: (NOTE) The Xpert Xpress SARS-CoV-2/FLU/RSV plus assay is intended as an aid in the diagnosis of influenza from Nasopharyngeal swab specimens and should not be used as a sole basis for treatment. Nasal washings and aspirates are unacceptable for Xpert Xpress  SARS-CoV-2/FLU/RSV testing.  Fact Sheet for Patients: BloggerCourse.com  Fact Sheet for Healthcare Providers: SeriousBroker.it  This test is not yet approved or cleared by the [place]  FDA and has been authorized for detection and/or diagnosis of SARS-CoV-2 by FDA under an Emergency Use Authorization (EUA). This EUA will remain in effect (meaning this test can be used) for the duration of the COVID-19 declaration under Section 564(b)(1) of the Act, 21 U.S.C. section 360bbb-3(b)(1), unless the authorization is terminated or revoked.     Resp Syncytial Virus by PCR NEGATIVE NEGATIVE Final    Comment: (NOTE) Fact Sheet for Patients: BloggerCourse.com  Fact Sheet for Healthcare Providers: SeriousBroker.it  This test is not yet approved or cleared by the [place]  FDA and has been authorized for detection and/or diagnosis of SARS-CoV-2 by FDA under an Emergency Use Authorization (EUA). This EUA will remain in effect (meaning this test can be used) for the duration of the COVID-19 declaration under Section 564(b)(1) of the Act, 21 U.S.C. section 360bbb-3(b)(1), unless the authorization is terminated or revoked.  Performed at [facility], [address] [place], [place] [address]   Resp panel by RT-PCR (RSV, Flu A&B, Covid) Anterior Nasal Swab     Status: None   Collection Time: 04/09/24  8:50 AM   Specimen: Anterior Nasal Swab  Result Value Ref Range Status   SARS Coronavirus  2 by RT PCR NEGATIVE NEGATIVE Final    Comment: (NOTE) SARS-CoV-2 target nucleic acids are NOT DETECTED.  The SARS-CoV-2 RNA is generally detectable in upper respiratory specimens during the acute phase of infection. The lowest concentration of SARS-CoV-2 viral copies this assay can detect is 138 copies/mL. A negative result does not preclude SARS-Cov-2 infection and should not be used  as the sole basis for treatment or other patient management decisions. A negative result may occur with  improper specimen collection/handling, submission of specimen other than nasopharyngeal swab, presence of viral mutation(s) within the areas targeted by this assay, and inadequate number of viral copies(<138 copies/mL). A negative result must be combined with clinical observations, patient history, and epidemiological information. The expected result is Negative.  Fact Sheet for Patients:  BloggerCourse.com  Fact Sheet for Healthcare Providers:  SeriousBroker.it  This test is no t yet approved or cleared by the [place]  FDA and  has been authorized for detection and/or diagnosis of SARS-CoV-2 by FDA under an Emergency Use Authorization (EUA). This EUA will remain  in effect (meaning this test can be used) for the duration of the COVID-19 declaration under Section 564(b)(1) of the Act, 21 U.S.C.section 360bbb-3(b)(1), unless the authorization is terminated  or revoked sooner.       Influenza A by PCR NEGATIVE NEGATIVE Final   Influenza B by PCR NEGATIVE NEGATIVE Final    Comment: (NOTE) The Xpert Xpress SARS-CoV-2/FLU/RSV plus assay is intended as an aid in the diagnosis of influenza from Nasopharyngeal swab specimens and should not be used as a sole basis for treatment. Nasal washings and aspirates are unacceptable for Xpert Xpress SARS-CoV-2/FLU/RSV testing.  Fact Sheet for Patients: BloggerCourse.com  Fact Sheet for Healthcare Providers: SeriousBroker.it  This test is not yet approved or cleared by the [place]  FDA and has been authorized for detection and/or diagnosis of SARS-CoV-2 by FDA under an Emergency Use Authorization (EUA). This EUA will remain in effect (meaning this test can be used) for the duration of the COVID-19 declaration under Section 564(b)(1)  of the Act, 21 U.S.C. section 360bbb-3(b)(1), unless the authorization is terminated or revoked.     Resp Syncytial Virus by PCR NEGATIVE NEGATIVE Final    Comment: (NOTE) Fact Sheet for Patients: BloggerCourse.com  Fact Sheet for Healthcare Providers: SeriousBroker.it  This test is not yet approved or cleared by the [place]  FDA and has been authorized for detection and/or diagnosis of SARS-CoV-2 by FDA under an Emergency Use Authorization (EUA). This EUA will remain in effect (meaning this test can be used) for the duration of the COVID-19 declaration under Section 564(b)(1) of the Act, 21 U.S.C. section 360bbb-3(b)(1), unless the authorization is terminated or revoked.  Performed at [facility], [address], [place], [place] [address]          Radiology Studies: DG Chest Portable 1 View Result Date: 04/09/2024 CLINICAL DATA:  Shortness of breath.  COPD.  Hypoxia. EXAM: PORTABLE CHEST 1 VIEW COMPARISON:  03/31/2024 FINDINGS: Stable mild cardiomegaly. Pulmonary hyperinflation is seen, consistent with COPD. Airspace opacity in the right lower lobe and small right pleural effusion show no significant change. No new or worsening areas of opacity are seen. IMPRESSION: No significant change in right lower lobe airspace opacity and small right pleural effusion, suspicious for pneumonia. COPD. Electronically Signed   By: Norleen DELENA Kil M.D.   On: 04/09/2024 09:57        Scheduled Meds:  aspirin  EC  81 mg Oral Daily  atorvastatin   10 mg Oral Daily   budesonide -glycopyrrolate -formoterol   2 puff Inhalation BID   calcium  carbonate  500 mg of elemental calcium  Oral BID WC   clopidogrel   75 mg Oral Daily   enoxaparin  (LOVENOX ) injection  40 mg Subcutaneous Q24H   escitalopram   10 mg Oral Daily   ferrous sulfate   325 mg Oral BID WC   hydrochlorothiazide   12.5 mg Oral Daily   insulin  aspart  0-15 Units Subcutaneous  TID WC   insulin  aspart  0-5 Units Subcutaneous QHS   insulin  glargine  10 Units Subcutaneous Daily   ipratropium-albuterol   3 mL Nebulization TID   losartan   25 mg Oral Daily   methylPREDNISolone  (SOLU-MEDROL ) injection  40 mg Intravenous Q12H   metoprolol  succinate  50 mg Oral Daily   pantoprazole   40 mg Oral Daily   pregabalin   75 mg Oral BID   vitamin B-12  100 mcg Oral Daily   Continuous Infusions:  azithromycin  Stopped (04/09/24 1924)     LOS: 0 days       Anthony CHRISTELLA Pouch, MD [facility] Pager [phone]  If 7PM-7AM, please contact night-coverage [email or web address] 04/10/2024, 12:42 PM

## 2024-04-10 NOTE — Evaluation (Signed)
 Occupational Therapy Evaluation Patient Details Name: Angela Jensen MRN: [id number] DOB: [birth year] Today's Date: 04/10/2024   History of Present Illness   Angela Jensen is a 68 y.o. female with medical history significant of COPD Gold stage IV, chronic hypoxic respiratory failure on 4 L continuously, right-sided lung cancer status post radiation therapy, SSS status post pacemaker, chronic HFpEF, HTN, IDDM, presented with worsening of wheezing and shortness of breath.    Clinical Impressions Ms Chanda was seen for OT evaluation this date. Prior to hospital admission, pt was MOD I using rollator as needed. Pt lives with boyfriend.On arrival pt standing with RN completing bath. Pt currently requires SUPERVISION standing grooming tasks, desat SpO2 80% on 4L [place], resolved to 90s with seated rest break and PLB. Good recall of energy conservation strategies, appears near baseline however reports fatigue and defers further mobility at this time. No skilled acute OT needs identified, will sign off. Upon hospital discharge, recommend cardiopulmonary rehab.    If plan is discharge home, recommend the following:   Help with stairs or ramp for entrance     Functional Status Assessment   Patient has had a recent decline in their functional status and demonstrates the ability to make significant improvements in function in a reasonable and predictable amount of time.     Equipment Recommendations   None recommended by OT     Recommendations for Other Services         Precautions/Restrictions   Precautions Precautions: None Recall of Precautions/Restrictions: Intact Restrictions Weight Bearing Restrictions Per Provider Order: No     Mobility Bed Mobility Overal bed mobility: Modified Independent                  Transfers Overall transfer level: Modified independent                        Balance Overall balance assessment: No apparent balance deficits (not  formally assessed)                                         ADL either performed or assessed with clinical judgement   ADL Overall ADL's : Needs assistance/impaired                                       General ADL Comments: SUPERVISION standing grooming tasks and ADL t/f     Vision         Perception         Praxis         Pertinent Vitals/Pain Pain Assessment Pain Assessment: No/denies pain     Extremity/Trunk Assessment Upper Extremity Assessment Upper Extremity Assessment: Overall WFL for tasks assessed   Lower Extremity Assessment Lower Extremity Assessment: Overall WFL for tasks assessed       Communication Communication Communication: No apparent difficulties   Cognition Arousal: Alert Behavior During Therapy: WFL for tasks assessed/performed Cognition: No apparent impairments                               Following commands: Intact       Cueing  General Comments          Exercises     Shoulder Instructions  Home Living Family/patient expects to be discharged to:: Private residence Living Arrangements: Spouse/significant other Available Help at Discharge: Family Type of Home: House Home Access: Stairs to enter Secretary/administrator of Steps: 1   Home Layout: Laundry or work area in basement;Two level;Able to live on main level with bedroom/bathroom     Bathroom Shower/Tub: Chief Strategy Officer: Standard     Home Equipment: Rollator (4 wheels);Cane - single point;Shower seat   Additional Comments: On 3-4L/min O2 at all times.      Prior Functioning/Environment Prior Level of Function : Independent/Modified Independent;Driving             Mobility Comments: PRN use of rollator, otherwise walks without any AD (primarily uses the rollator while cooking, so she can sit). Denies falls in the past 6 months. Cares for 62 month old great grandchild during the day.       OT Problem List: Decreased activity tolerance   OT Treatment/Interventions:        OT Goals(Current goals can be found in the care plan section)   Acute Rehab OT Goals Patient Stated Goal: to go home OT Goal Formulation: With patient Time For Goal Achievement: 04/10/24 Potential to Achieve Goals: Good   OT Frequency:       Co-evaluation              AM-PAC OT 6 Clicks Daily Activity     Outcome Measure Help from another person eating meals?: None Help from another person taking care of personal grooming?: None Help from another person toileting, which includes using toliet, bedpan, or urinal?: None Help from another person bathing (including washing, rinsing, drying)?: None Help from another person to put on and taking off regular upper body clothing?: None Help from another person to put on and taking off regular lower body clothing?: None 6 Click Score: 24   End of Session Equipment Utilized During Treatment: Oxygen  Nurse Communication: Mobility status  Activity Tolerance: Patient tolerated treatment well Patient left: in bed;with call bell/phone within reach;with bed alarm set;with nursing/sitter in room  OT Visit Diagnosis: Other abnormalities of gait and mobility (R26.89)                Time: 8464-8453 OT Time Calculation (min): 11 min Charges:  OT General Charges $OT Visit: 1 Visit OT Evaluation $OT Eval Low Complexity: 1 Low  Elston Slot, M.S. OTR/L  04/10/24, 4:17 PM  ascom [phone]

## 2024-04-10 NOTE — TOC Initial Note (Signed)
 Transition of Care [facility]) - Initial/Assessment Note    Patient Details  Name: Angela Jensen MRN: [id number] Date of Birth: 19-May-1956  [facility] of [facility]) CM/SW Contact:    Alfonso Rummer, LCSW Phone Number: 04/10/2024, 12:12 PM  Clinical Narrative:                 KEN DELENA Rummer met with pt in room 102. Pt was inviting and engaged during [facility] assessment. Pt reports utilizing 3-4L of O2 from [employer] [facility] and previous hx with [facility] home health. Pt reports family is very supportive and significant other drives her to scheduled medical appts. PCP is Mardy Maxin and Dr. Prentice Picking for pulmonlogy.     Barriers to Discharge: No Barriers Identified   Patient Goals and CMS Choice     Choice offered to / list presented to : Patient      Expected Discharge Plan and Services     Post Acute Care Choice: Home Health Living arrangements for the past 2 months: Single Family Home                   DME Agency: [employer]                  Prior Living Arrangements/Services Living arrangements for the past 2 months: Single Family Home Lives with:: Significant Other   Do you feel safe going back to the place where you live?: Yes        Care giver support system in place?: Yes (comment) Current home services: DME (pt on 3-4L of O2 thru [employer])    Activities of Daily Living   ADL Screening (condition at time of admission) Independently performs ADLs?: Yes (appropriate for developmental age) Is the patient deaf or have difficulty hearing?: No Does the patient have difficulty seeing, even when wearing glasses/contacts?: No Does the patient have difficulty concentrating, remembering, or making decisions?: No  Permission Sought/Granted                  Emotional Assessment Appearance:: Appears stated age   Affect (typically observed): Appropriate Orientation: : Oriented to Self, Oriented to Place, Oriented to  Time, Oriented to Situation      Admission  diagnosis:  COPD (chronic obstructive pulmonary disease) (HCC) [J44.9] COPD exacerbation (HCC) [J44.1] Patient Active Problem List   Diagnosis Date Noted   COPD (chronic obstructive pulmonary disease) (HCC) 04/09/2024   Polyp of transverse colon 04/03/2024   Hematochezia 03/31/2024   Acute on chronic hypoxic respiratory failure (HCC) 03/20/2024   COPD with acute exacerbation (HCC) 03/19/2024   Chronic heart failure with preserved ejection fraction (HFpEF) (HCC) 01/28/2024   SSS (sick sinus syndrome) s/p leadless pacemaker (HCC) 01/28/2024   S/p femoral endarterectomy 01/21/2024 01/27/2024   Atherosclerosis of artery of extremity with rest pain (HCC) 01/18/2024   Ischemic leg 01/17/2024   Presbycusis of both ears 08/13/2023   Acute on chronic respiratory failure with hypoxia and hypercapnia (HCC) 06/15/2023   Acute on chronic diastolic CHF (congestive heart failure) (HCC) 06/15/2023   Transaminitis 06/15/2023   Overweight (BMI 25.0-29.9) 06/15/2023   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   Chronic right shoulder pain 11/30/2022   Anxiety and depression 11/19/2022   GERD without esophagitis 11/19/2022   Uncontrolled type 2 diabetes mellitus with hyperglycemia, with long-term current use of insulin  (HCC) 11/19/2022   Right rib fracture 09/23/2022   Symptomatic bradycardia 07/09/2021   Chronic respiratory failure with hypoxia (HCC)    CAP (  community acquired pneumonia) 06/26/2021   COPD exacerbation (HCC) 06/11/2021   SOB (shortness of breath) 05/01/2021   Chronic combined systolic and diastolic CHF (congestive heart failure) (HCC) 05/01/2021   Acute exacerbation of chronic obstructive pulmonary disease (COPD) (HCC) 04/30/2021   Elevated troponin 04/01/2021   Acute on chronic respiratory failure with hypoxia (HCC) 03/31/2021   History of recurrent right pneumothorax 05/01/2020   Chest pain on breathing 04/21/2020   Pleuritic chest pain 04/11/2020   Right lower  lobe pulmonary nodule 04/02/2020   IDA (iron  deficiency anemia) 04/02/2020   Pica in adults 04/02/2020   Goals of care, counseling/discussion 03/14/2020   Pain in left wrist 09/24/2019   Encounter for general adult medical examination with abnormal findings 06/28/2019   Elevated total protein 01/22/2019   Uncontrolled type 2 diabetes mellitus with hyperglycemia (HCC) 05/16/2018   Chronic obstructive pulmonary disease (HCC) 05/16/2018   Vitamin D  deficiency 05/16/2018   Flu vaccine need 05/16/2018   Dysuria 05/16/2018   Cancer of lower lobe of right lung (HCC) 01/21/2018   Screening for breast cancer 10/12/2017   Personal history of tobacco use, presenting hazards to health 02/03/2017   PAD (peripheral artery disease) (HCC) 06/22/2016   Essential hypertension 05/26/2016   Diabetes mellitus (HCC) 05/26/2016   Hyperlipidemia associated with type 2 diabetes mellitus (HCC) 05/26/2016   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/2016   Uterine leiomyoma 04/30/2016   Elevated CEA 01/31/2016   Special screening for malignant neoplasms, colon    Benign neoplasm of sigmoid colon    Primary lung cancer (HCC)    PCP:  Liana Fish, NP Pharmacy:   [facility] [address]), [facility] [address] [place]) [place] [address] Phone: [phone] Fax: [phone]  [facility] [address] [place] [address] Phone: [phone] Fax: [phone]     Social Drivers of Health (SDOH) Social History: SDOH Screenings   Food Insecurity: No Food Insecurity (04/09/2024)  Housing: Low Risk  (04/09/2024)  Transportation Needs: No Transportation Needs (04/09/2024)  Utilities: Not At Risk (04/09/2024)  Alcohol Screen: Low Risk  (12/11/2021)  Depression (PHQ2-9): Low Risk  (03/28/2024)  Social Connections: Moderately Integrated (04/09/2024)  Tobacco  Use: Medium Risk (04/09/2024)   SDOH Interventions:     Readmission Risk Interventions    03/21/2024    3:56 PM 01/26/2024   10:40 AM  Readmission Risk Prevention Plan  Transportation Screening Complete Complete  PCP or Specialist Appt within 3-5 Days  Complete  HRI or Home Care Consult Complete   Social Work Consult for Recovery Care Planning/Counseling Complete Complete  Palliative Care Screening Not Applicable Not Applicable  Medication Review Oceanographer) Complete Complete

## 2024-04-10 NOTE — Care Management Obs Status (Signed)
 MEDICARE OBSERVATION STATUS NOTIFICATION   Patient Details  Name: Angela Jensen MRN: [id number] Date of Birth: Oct 20, 1955   Medicare Observation Status Notification Given:  Yes    Samari Gorby W, CMA 04/10/2024, 10:59 AM

## 2024-04-10 NOTE — Plan of Care (Signed)
  Problem: Coping: Goal: Ability to adjust to condition or change in health will improve Outcome: Progressing   Problem: Metabolic: Goal: Ability to maintain appropriate glucose levels will improve Outcome: Progressing   Problem: Tissue Perfusion: Goal: Adequacy of tissue perfusion will improve Outcome: Progressing   Problem: Health Behavior/Discharge Planning: Goal: Ability to manage health-related needs will improve Outcome: Progressing

## 2024-04-10 NOTE — Plan of Care (Signed)
 Patient had a good day today. Was up and moving more and she states she is ready to get back to her baseline.   Problem: Education: Goal: Ability to describe self-care measures that may prevent or decrease complications (Diabetes Survival Skills Education) will improve Outcome: Progressing Goal: Individualized Educational Video(s) Outcome: Progressing   Problem: Coping: Goal: Ability to adjust to condition or change in health will improve Outcome: Progressing   Problem: Fluid Volume: Goal: Ability to maintain a balanced intake and output will improve Outcome: Progressing   Problem: Health Behavior/Discharge Planning: Goal: Ability to identify and utilize available resources and services will improve Outcome: Progressing Goal: Ability to manage health-related needs will improve Outcome: Progressing   Problem: Metabolic: Goal: Ability to maintain appropriate glucose levels will improve Outcome: Progressing   Problem: Nutritional: Goal: Maintenance of adequate nutrition will improve Outcome: Progressing Goal: Progress toward achieving an optimal weight will improve Outcome: Progressing   Problem: Skin Integrity: Goal: Risk for impaired skin integrity will decrease Outcome: Progressing   Problem: Tissue Perfusion: Goal: Adequacy of tissue perfusion will improve Outcome: Progressing   Problem: Education: Goal: Knowledge of General Education information will improve Description: Including pain rating scale, medication(s)/side effects and non-pharmacologic comfort measures Outcome: Progressing   Problem: Health Behavior/Discharge Planning: Goal: Ability to manage health-related needs will improve Outcome: Progressing   Problem: Clinical Measurements: Goal: Ability to maintain clinical measurements within normal limits will improve Outcome: Progressing Goal: Will remain free from infection Outcome: Progressing Goal: Diagnostic test results will improve Outcome:  Progressing Goal: Respiratory complications will improve Outcome: Progressing Goal: Cardiovascular complication will be avoided Outcome: Progressing   Problem: Activity: Goal: Risk for activity intolerance will decrease Outcome: Progressing   Problem: Nutrition: Goal: Adequate nutrition will be maintained Outcome: Progressing   Problem: Coping: Goal: Level of anxiety will decrease Outcome: Progressing   Problem: Elimination: Goal: Will not experience complications related to bowel motility Outcome: Progressing Goal: Will not experience complications related to urinary retention Outcome: Progressing   Problem: Pain Managment: Goal: General experience of comfort will improve and/or be controlled Outcome: Progressing   Problem: Safety: Goal: Ability to remain free from injury will improve Outcome: Progressing   Problem: Skin Integrity: Goal: Risk for impaired skin integrity will decrease Outcome: Progressing

## 2024-04-11 DIAGNOSIS — J9621 Acute and chronic respiratory failure with hypoxia: Secondary | ICD-10-CM | POA: Diagnosis present

## 2024-04-11 DIAGNOSIS — Z79899 Other long term (current) drug therapy: Secondary | ICD-10-CM | POA: Diagnosis not present

## 2024-04-11 DIAGNOSIS — Z1152 Encounter for screening for COVID-19: Secondary | ICD-10-CM | POA: Diagnosis not present

## 2024-04-11 DIAGNOSIS — Z794 Long term (current) use of insulin: Secondary | ICD-10-CM | POA: Diagnosis not present

## 2024-04-11 DIAGNOSIS — I5033 Acute on chronic diastolic (congestive) heart failure: Secondary | ICD-10-CM | POA: Diagnosis present

## 2024-04-11 DIAGNOSIS — Z7902 Long term (current) use of antithrombotics/antiplatelets: Secondary | ICD-10-CM | POA: Diagnosis not present

## 2024-04-11 DIAGNOSIS — J449 Chronic obstructive pulmonary disease, unspecified: Secondary | ICD-10-CM | POA: Diagnosis present

## 2024-04-11 DIAGNOSIS — Z833 Family history of diabetes mellitus: Secondary | ICD-10-CM | POA: Diagnosis not present

## 2024-04-11 DIAGNOSIS — Z7951 Long term (current) use of inhaled steroids: Secondary | ICD-10-CM | POA: Diagnosis not present

## 2024-04-11 DIAGNOSIS — Z9981 Dependence on supplemental oxygen: Secondary | ICD-10-CM | POA: Diagnosis not present

## 2024-04-11 DIAGNOSIS — Z85118 Personal history of other malignant neoplasm of bronchus and lung: Secondary | ICD-10-CM | POA: Diagnosis not present

## 2024-04-11 DIAGNOSIS — Z923 Personal history of irradiation: Secondary | ICD-10-CM | POA: Diagnosis not present

## 2024-04-11 DIAGNOSIS — Z7984 Long term (current) use of oral hypoglycemic drugs: Secondary | ICD-10-CM | POA: Diagnosis not present

## 2024-04-11 DIAGNOSIS — J441 Chronic obstructive pulmonary disease with (acute) exacerbation: Secondary | ICD-10-CM | POA: Diagnosis not present

## 2024-04-11 DIAGNOSIS — Z950 Presence of cardiac pacemaker: Secondary | ICD-10-CM | POA: Diagnosis not present

## 2024-04-11 DIAGNOSIS — I110 Hypertensive heart disease with heart failure: Secondary | ICD-10-CM | POA: Diagnosis present

## 2024-04-11 DIAGNOSIS — Z8249 Family history of ischemic heart disease and other diseases of the circulatory system: Secondary | ICD-10-CM | POA: Diagnosis not present

## 2024-04-11 DIAGNOSIS — Z860100 Personal history of colon polyps, unspecified: Secondary | ICD-10-CM | POA: Diagnosis not present

## 2024-04-11 DIAGNOSIS — Z83438 Family history of other disorder of lipoprotein metabolism and other lipidemia: Secondary | ICD-10-CM | POA: Diagnosis not present

## 2024-04-11 DIAGNOSIS — Z87891 Personal history of nicotine dependence: Secondary | ICD-10-CM | POA: Diagnosis not present

## 2024-04-11 DIAGNOSIS — Z7982 Long term (current) use of aspirin: Secondary | ICD-10-CM | POA: Diagnosis not present

## 2024-04-11 DIAGNOSIS — J701 Chronic and other pulmonary manifestations due to radiation: Secondary | ICD-10-CM | POA: Diagnosis present

## 2024-04-11 DIAGNOSIS — E1051 Type 1 diabetes mellitus with diabetic peripheral angiopathy without gangrene: Secondary | ICD-10-CM | POA: Diagnosis present

## 2024-04-11 DIAGNOSIS — R042 Hemoptysis: Secondary | ICD-10-CM | POA: Diagnosis not present

## 2024-04-11 DIAGNOSIS — J440 Chronic obstructive pulmonary disease with acute lower respiratory infection: Secondary | ICD-10-CM | POA: Diagnosis not present

## 2024-04-11 DIAGNOSIS — Z801 Family history of malignant neoplasm of trachea, bronchus and lung: Secondary | ICD-10-CM | POA: Diagnosis not present

## 2024-04-11 DIAGNOSIS — I739 Peripheral vascular disease, unspecified: Secondary | ICD-10-CM | POA: Diagnosis not present

## 2024-04-11 LAB — CBC
HCT: 30.1 % — ABNORMAL LOW (ref 36.0–46.0)
Hemoglobin: 9.1 g/dL — ABNORMAL LOW (ref 12.0–15.0)
MCH: 28 pg (ref 26.0–34.0)
MCHC: 30.2 g/dL (ref 30.0–36.0)
MCV: 92.6 fL (ref 80.0–100.0)
Platelets: 245 K/uL (ref 150–400)
RBC: 3.25 MIL/uL — ABNORMAL LOW (ref 3.87–5.11)
RDW: 15 % (ref 11.5–15.5)
WBC: 8.8 K/uL (ref 4.0–10.5)
nRBC: 0 % (ref 0.0–0.2)

## 2024-04-11 LAB — BASIC METABOLIC PANEL WITH GFR
Anion gap: 9 (ref 5–15)
BUN: 27 mg/dL — ABNORMAL HIGH (ref 8–23)
CO2: 33 mmol/L — ABNORMAL HIGH (ref 22–32)
Calcium: 9.1 mg/dL (ref 8.9–10.3)
Chloride: 98 mmol/L (ref 98–111)
Creatinine, Ser: 0.55 mg/dL (ref 0.44–1.00)
GFR, Estimated: >60 mL/min (ref >60)
Glucose, Bld: 176 mg/dL — ABNORMAL HIGH (ref 70–99)
Potassium: 3.7 mmol/L (ref 3.5–5.1)
Sodium: 140 mmol/L (ref 135–145)

## 2024-04-11 LAB — GLUCOSE, CAPILLARY
Glucose-Capillary: 149 mg/dL — ABNORMAL HIGH (ref 70–99)
Glucose-Capillary: 208 mg/dL — ABNORMAL HIGH (ref 70–99)
Glucose-Capillary: 414 mg/dL — ABNORMAL HIGH (ref 70–99)
Glucose-Capillary: 138 mg/dL — ABNORMAL HIGH (ref 70–99)

## 2024-04-11 LAB — MYCOPLASMA PNEUMONIAE ANTIBODY, IGM: Mycoplasma pneumo IgM: <770 U/mL (ref 0–769)

## 2024-04-11 NOTE — Plan of Care (Signed)
  Problem: Fluid Volume: Goal: Ability to maintain a balanced intake and output will improve Outcome: Progressing   Problem: Health Behavior/Discharge Planning: Goal: Ability to manage health-related needs will improve Outcome: Progressing   Problem: Nutritional: Goal: Maintenance of adequate nutrition will improve Outcome: Progressing   Problem: Tissue Perfusion: Goal: Adequacy of tissue perfusion will improve Outcome: Progressing

## 2024-04-11 NOTE — Progress Notes (Signed)
 PROGRESS NOTE   HPI was taken from Dr. Laurita: Angela Jensen is a 68 y.o. female with medical history significant of COPD Gold stage IV, chronic hypoxic respiratory failure on 4 L continuously, right-sided lung cancer status post radiation therapy, SSS status post pacemaker, chronic HFpEF, HTN, IDDM, presented with worsening of wheezing and shortness of breath.   Symptoms started 2 days ago, patient started to experience worsening of exertional dyspnea despite on oxygen .  She found only walking in side of her house causes significant shortness of breath.  Denies any chest pain no cough no fever or chills.  Patient has been followed with pulmonology for advanced lung problems and most recent PFT showed FEV1 24%, and patient was last seen by pulmonary 1 month ago when pulmonologist started patient on MWF Bactrim  as well as daily Decadron  to prevent future pneumonia and COPD exacerbation.  Despite the patient came back today for shortness of breath.   ED Course: Afebrile, nontachycardic blood pressure 180/80 O2 saturation 99% on 4 L.  Chest x-ray negative for acute findings, VBG showed 732/58/94, BUN 19 creatinine 0.5 bicarb 25, glucose 188 WBC 6.0.   Patient was given IV Solu-Medrol  and breathing treatment x 2 however continued experience dyspnea with short ambulating.   Angela Jensen  FMW:[id number] DOB: 1955/11/14 DOA: 04/09/2024 PCP: Liana Fish, NP   Assessment & Plan:   Principal Problem:   COPD (chronic obstructive pulmonary disease) (HCC) Active Problems:   Acute exacerbation of chronic obstructive pulmonary disease (COPD) (HCC)  Assessment and Plan: Acute COPD exacerbation: continue on azithromycin , steroids, bronchodilators & encourage incentive spirometry. Legionella is pending   Acute on chronic hypoxic respiratory failure: continue on supplemental oxygen  and wean back to baseline as tolerated, currently on 5L [place]. Uses 4L [place] at home. Hx of chronic radiation lung fibrosis     HTN: poorly controlled. Continue on home dose of losartan , metoprolol , HCTZ  Acute on chronic diastolic CHF: appears compensated. Monitor I/Os. Continue on home dose of losartan , metoprolol     DM2: poorly controlled, HbA1c 8.0 in May 2025. Continue on SSI w/ accuchecks   Hx of PAD: continue on home dose of aspirin , plavix         DVT prophylaxis: lovenox   Code Status: full Family Communication:  Disposition Plan: likely d/c back home tomorrow and will start cardiopulmonary rehab on 04/13/24   Level of care: Telemetry Medical  Status is: Inpatient Remains inpatient appropriate because: can likely d/c home tomorrow if dyspnea improved & back to baseline oxygen  level     Consultants:    Procedures:   Antimicrobials: azithromycin     Subjective: Pt c/o shortness of breath   Objective: Vitals:   04/10/24 1916 04/10/24 1927 04/11/24 0615 04/11/24 0744  BP:  123/79 (!) 144/72 (!) 162/89  Pulse:  80 70 76  Resp:  15 15 18   Temp:  98.2 F (36.8 C) 98.4 F (36.9 C) 98.2 F (36.8 C)  TempSrc:      SpO2: 95% 99% 99% 97%  Weight:      Height:        Intake/Output Summary (Last 24 hours) at 04/11/2024 0840 Last data filed at 04/10/2024 1627 Gross per 24 hour  Intake 430 ml  Output --  Net 430 ml   Filed Weights   04/09/24 0820  Weight: 53.1 kg    Examination:  General exam: appears comfortable  Respiratory system: decreased breath sounds b/l  Cardiovascular system: S1/S2+. No rubs or clicks   Gastrointestinal system:  abd is soft, NT, ND & hypoactive bowel sounds  Central nervous system: alert & oriented. Moves all extremities   Psychiatry: Judgement and insight appears at baseline. Flat mood and affect     Data Reviewed: I have personally reviewed following labs and imaging studies  CBC: Recent Labs  Lab 04/09/24 0834 04/11/24 0359  WBC 6.0 8.8  NEUTROABS 4.3  --   HGB 9.6* 9.1*  HCT 33.6* 30.1*  MCV 98.5 92.6  PLT 201 245   Basic Metabolic  Panel: Recent Labs  Lab 04/09/24 0834 04/11/24 0359  NA 140 140  K 4.2 3.7  CL 104 98  CO2 27 33*  GLUCOSE 188* 176*  BUN 19 27*  CREATININE 0.57 0.55  CALCIUM  8.1* 9.1   GFR: Estimated Creatinine Clearance: 50.2 mL/min (by C-G formula based on SCr of 0.55 mg/dL). Liver Function Tests: No results for input(s): AST, ALT, ALKPHOS, BILITOT, PROT, ALBUMIN in the last 168 hours. No results for input(s): LIPASE, AMYLASE in the last 168 hours. No results for input(s): AMMONIA in the last 168 hours. Coagulation Profile: No results for input(s): INR, PROTIME in the last 168 hours. Cardiac Enzymes: No results for input(s): CKTOTAL, CKMB, CKMBINDEX, TROPONINI in the last 168 hours. BNP (last 3 results) No results for input(s): PROBNP in the last 8760 hours. HbA1C: No results for input(s): HGBA1C in the last 72 hours. CBG: Recent Labs  Lab 04/10/24 0741 04/10/24 1219 04/10/24 1633 04/10/24 1929 04/11/24 0746  GLUCAP 143* 214* 236* 227* 149*   Lipid Profile: No results for input(s): CHOL, HDL, LDLCALC, TRIG, CHOLHDL, LDLDIRECT in the last 72 hours. Thyroid  Function Tests: No results for input(s): TSH, T4TOTAL, FREET4, T3FREE, THYROIDAB in the last 72 hours. Anemia Panel: No results for input(s): VITAMINB12, FOLATE, FERRITIN, TIBC, IRON , RETICCTPCT in the last 72 hours. Sepsis Labs: No results for input(s): PROCALCITON, LATICACIDVEN in the last 168 hours.  Recent Results (from the past 240 hours)  Resp panel by RT-PCR (RSV, Flu A&B, Covid) Anterior Nasal Swab     Status: None   Collection Time: 04/09/24  8:50 AM   Specimen: Anterior Nasal Swab  Result Value Ref Range Status   SARS Coronavirus 2 by RT PCR NEGATIVE NEGATIVE Final    Comment: (NOTE) SARS-CoV-2 target nucleic acids are NOT DETECTED.  The SARS-CoV-2 RNA is generally detectable in upper respiratory specimens during the acute phase of  infection. The lowest concentration of SARS-CoV-2 viral copies this assay can detect is 138 copies/mL. A negative result does not preclude SARS-Cov-2 infection and should not be used as the sole basis for treatment or other patient management decisions. A negative result may occur with  improper specimen collection/handling, submission of specimen other than nasopharyngeal swab, presence of viral mutation(s) within the areas targeted by this assay, and inadequate number of viral copies(<138 copies/mL). A negative result must be combined with clinical observations, patient history, and epidemiological information. The expected result is Negative.  Fact Sheet for Patients:  BloggerCourse.com  Fact Sheet for Healthcare Providers:  SeriousBroker.it  This test is no t yet approved or cleared by the [place]  FDA and  has been authorized for detection and/or diagnosis of SARS-CoV-2 by FDA under an Emergency Use Authorization (EUA). This EUA will remain  in effect (meaning this test can be used) for the duration of the COVID-19 declaration under Section 564(b)(1) of the Act, 21 U.S.C.section 360bbb-3(b)(1), unless the authorization is terminated  or revoked sooner.       Influenza A  by PCR NEGATIVE NEGATIVE Final   Influenza B by PCR NEGATIVE NEGATIVE Final    Comment: (NOTE) The Xpert Xpress SARS-CoV-2/FLU/RSV plus assay is intended as an aid in the diagnosis of influenza from Nasopharyngeal swab specimens and should not be used as a sole basis for treatment. Nasal washings and aspirates are unacceptable for Xpert Xpress SARS-CoV-2/FLU/RSV testing.  Fact Sheet for Patients: BloggerCourse.com  Fact Sheet for Healthcare Providers: SeriousBroker.it  This test is not yet approved or cleared by the [place]  FDA and has been authorized for detection and/or diagnosis of SARS-CoV-2  by FDA under an Emergency Use Authorization (EUA). This EUA will remain in effect (meaning this test can be used) for the duration of the COVID-19 declaration under Section 564(b)(1) of the Act, 21 U.S.C. section 360bbb-3(b)(1), unless the authorization is terminated or revoked.     Resp Syncytial Virus by PCR NEGATIVE NEGATIVE Final    Comment: (NOTE) Fact Sheet for Patients: BloggerCourse.com  Fact Sheet for Healthcare Providers: SeriousBroker.it  This test is not yet approved or cleared by the [place]  FDA and has been authorized for detection and/or diagnosis of SARS-CoV-2 by FDA under an Emergency Use Authorization (EUA). This EUA will remain in effect (meaning this test can be used) for the duration of the COVID-19 declaration under Section 564(b)(1) of the Act, 21 U.S.C. section 360bbb-3(b)(1), unless the authorization is terminated or revoked.  Performed at [facility], [address], [place], [place] [address]          Radiology Studies: DG Chest Portable 1 View Result Date: 04/09/2024 CLINICAL DATA:  Shortness of breath.  COPD.  Hypoxia. EXAM: PORTABLE CHEST 1 VIEW COMPARISON:  03/31/2024 FINDINGS: Stable mild cardiomegaly. Pulmonary hyperinflation is seen, consistent with COPD. Airspace opacity in the right lower lobe and small right pleural effusion show no significant change. No new or worsening areas of opacity are seen. IMPRESSION: No significant change in right lower lobe airspace opacity and small right pleural effusion, suspicious for pneumonia. COPD. Electronically Signed   By: Norleen DELENA Kil M.D.   On: 04/09/2024 09:57        Scheduled Meds:  aspirin  EC  81 mg Oral Daily   atorvastatin   10 mg Oral Daily   budesonide -glycopyrrolate -formoterol   2 puff Inhalation BID   calcium  carbonate  500 mg of elemental calcium  Oral BID WC   clopidogrel   75 mg Oral Daily   enoxaparin  (LOVENOX )  injection  40 mg Subcutaneous Q24H   escitalopram   10 mg Oral Daily   ferrous sulfate   325 mg Oral BID WC   hydrochlorothiazide   12.5 mg Oral Daily   insulin  aspart  0-15 Units Subcutaneous TID WC   insulin  aspart  0-5 Units Subcutaneous QHS   insulin  glargine  10 Units Subcutaneous Daily   ipratropium-albuterol   3 mL Nebulization TID   losartan   25 mg Oral Daily   methylPREDNISolone  (SOLU-MEDROL ) injection  40 mg Intravenous Q12H   metoprolol  succinate  50 mg Oral Daily   pantoprazole   40 mg Oral Daily   pregabalin   75 mg Oral BID   vitamin B-12  100 mcg Oral Daily   Continuous Infusions:  azithromycin  500 mg (04/10/24 1544)     LOS: 0 days       Anthony CHRISTELLA Pouch, MD [facility] Pager [phone]  If 7PM-7AM, please contact night-coverage [email or web address] 04/11/2024, 8:40 AM

## 2024-04-11 NOTE — TOC Initial Note (Signed)
 Transition of Care [facility]) - Initial/Assessment Note    Patient Details  Name: Angela Jensen MRN: [id number] Date of Birth: May 21, 1956  [facility] CM/SW Contact:    Dalia GORMAN Fuse, RN Phone Number: 04/11/2024, 1:55 PM  Clinical Narrative:                  Patient is from home with her husband. She is on 3-4 L [place] @baseline . She is active with [facility] PT and RN. No other TOC needs identified. Please outreach to [facility] if needs identified.      Barriers to Discharge: No Barriers Identified   Patient Goals and CMS Choice     Choice offered to / list presented to : Patient      Expected Discharge Plan and Services     Post Acute Care Choice: Home Health Living arrangements for the past 2 months: Single Family Home                   DME Agency: [employer]                  Prior Living Arrangements/Services Living arrangements for the past 2 months: Single Family Home Lives with:: Significant Other   Do you feel safe going back to the place where you live?: Yes        Care giver support system in place?: Yes (comment) Current home services: DME (pt on 3-4L of O2 thru [employer])    Activities of Daily Living   ADL Screening (condition at time of admission) Independently performs ADLs?: Yes (appropriate for developmental age) Is the patient deaf or have difficulty hearing?: No Does the patient have difficulty seeing, even when wearing glasses/contacts?: No Does the patient have difficulty concentrating, remembering, or making decisions?: No  Permission Sought/Granted                  Emotional Assessment Appearance:: Appears stated age   Affect (typically observed): Appropriate Orientation: : Oriented to Self, Oriented to Place, Oriented to  Time, Oriented to Situation      Admission diagnosis:  COPD (chronic obstructive pulmonary disease) (HCC) [J44.9] COPD exacerbation (HCC) [J44.1] Patient Active Problem List   Diagnosis Date  Noted   COPD (chronic obstructive pulmonary disease) (HCC) 04/09/2024   Polyp of transverse colon 04/03/2024   Hematochezia 03/31/2024   Acute on chronic hypoxic respiratory failure (HCC) 03/20/2024   COPD with acute exacerbation (HCC) 03/19/2024   Chronic heart failure with preserved ejection fraction (HFpEF) (HCC) 01/28/2024   SSS (sick sinus syndrome) s/p leadless pacemaker (HCC) 01/28/2024   S/p femoral endarterectomy 01/21/2024 01/27/2024   Atherosclerosis of artery of extremity with rest pain (HCC) 01/18/2024   Ischemic leg 01/17/2024   Presbycusis of both ears 08/13/2023   Acute on chronic respiratory failure with hypoxia and hypercapnia (HCC) 06/15/2023   Acute on chronic diastolic CHF (congestive heart failure) (HCC) 06/15/2023   Transaminitis 06/15/2023   Overweight (BMI 25.0-29.9) 06/15/2023   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   Chronic right shoulder pain 11/30/2022   Anxiety and depression 11/19/2022   GERD without esophagitis 11/19/2022   Uncontrolled type 2 diabetes mellitus with hyperglycemia, with long-term current use of insulin  (HCC) 11/19/2022   Right rib fracture 09/23/2022   Symptomatic bradycardia 07/09/2021   Chronic respiratory failure with hypoxia (HCC)    CAP (community acquired pneumonia) 06/26/2021   COPD exacerbation (HCC) 06/11/2021   SOB (shortness of breath) 05/01/2021  Chronic combined systolic and diastolic CHF (congestive heart failure) (HCC) 05/01/2021   Acute exacerbation of chronic obstructive pulmonary disease (COPD) (HCC) 04/30/2021   Elevated troponin 04/01/2021   Acute on chronic respiratory failure with hypoxia (HCC) 03/31/2021   History of recurrent right pneumothorax 05/01/2020   Chest pain on breathing 04/21/2020   Pleuritic chest pain 04/11/2020   Right lower lobe pulmonary nodule 04/02/2020   IDA (iron  deficiency anemia) 04/02/2020   Pica in adults 04/02/2020   Goals of care, counseling/discussion  03/14/2020   Pain in left wrist 09/24/2019   Encounter for general adult medical examination with abnormal findings 06/28/2019   Elevated total protein 01/22/2019   Uncontrolled type 2 diabetes mellitus with hyperglycemia (HCC) 05/16/2018   Chronic obstructive pulmonary disease (HCC) 05/16/2018   Vitamin D  deficiency 05/16/2018   Flu vaccine need 05/16/2018   Dysuria 05/16/2018   Cancer of lower lobe of right lung (HCC) 01/21/2018   Screening for breast cancer 10/12/2017   Personal history of tobacco use, presenting hazards to health 02/03/2017   PAD (peripheral artery disease) (HCC) 06/22/2016   Essential hypertension 05/26/2016   Diabetes mellitus (HCC) 05/26/2016   Hyperlipidemia associated with type 2 diabetes mellitus (HCC) 05/26/2016   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/2016   Uterine leiomyoma 04/30/2016   Elevated CEA 01/31/2016   Special screening for malignant neoplasms, colon    Benign neoplasm of sigmoid colon    Primary lung cancer (HCC)    PCP:  Liana Fish, NP Pharmacy:   [facility] [address] ([address]), [place] - [address] [place]) [place] [address] Phone: [phone] Fax: [phone]  [facility] [address] [place] [address] Phone: [phone] Fax: [phone]     Social Drivers of Health (SDOH) Social History: SDOH Screenings   Food Insecurity: No Food Insecurity (04/09/2024)  Housing: Low Risk  (04/09/2024)  Transportation Needs: No Transportation Needs (04/09/2024)  Utilities: Not At Risk (04/09/2024)  Alcohol Screen: Low Risk  (12/11/2021)  Depression (PHQ2-9): Low Risk  (03/28/2024)  Social Connections: Moderately Integrated (04/09/2024)  Tobacco Use: Medium Risk (04/09/2024)   SDOH Interventions:     Readmission Risk Interventions    03/21/2024    3:56 PM 01/26/2024   10:40 AM   Readmission Risk Prevention Plan  Transportation Screening Complete Complete  PCP or Specialist Appt within 3-5 Days  Complete  HRI or Home Care Consult Complete   Social Work Consult for Recovery Care Planning/Counseling Complete Complete  Palliative Care Screening Not Applicable Not Applicable  Medication Review Oceanographer) Complete Complete

## 2024-04-11 NOTE — Evaluation (Signed)
 Physical Therapy Evaluation Patient Details Name: Angela Jensen MRN: [id number] DOB: [birth year] Today's Date: 04/11/2024  History of Present Illness  Angela Jensen is Jensen 68 y.o. female with medical history significant of COPD Gold stage IV, chronic hypoxic respiratory failure on 4 L continuously, right-sided lung cancer status post radiation therapy, SSS status post pacemaker, chronic HFpEF, HTN, IDDM, presented with worsening of wheezing and shortness of breath.  Clinical Impression  Patient admitted with the above. PTA, patient lives with boyfriend and was modI with use of rollator at home as well 3-4L O2 at baseline. Patient able to complete bed mobility and sit to stand modI. Ambulated with RW modI with 4L O2. After 100', patient spO2 dropped to 84% on 4L and required standing rest break and increase to 6L to bring to 89% then titrated back down to 4L for remaining ambulation with ability to maintain >87%. No further skilled PT needs identified acutely. Recommend cardiopulmonary rehab at discharge.          If plan is discharge home, recommend the following: Help with stairs or ramp for entrance   Can travel by private vehicle        Equipment Recommendations None recommended by PT  Recommendations for Other Services       Functional Status Assessment Patient has had Jensen recent decline in their functional status and demonstrates the ability to make significant improvements in function in Jensen reasonable and predictable amount of time.     Precautions / Restrictions Precautions Precautions: None Recall of Precautions/Restrictions: Intact Restrictions Weight Bearing Restrictions Per Provider Order: No      Mobility  Bed Mobility Overal bed mobility: Modified Independent                  Transfers Overall transfer level: Modified independent Equipment used: Rolling Angela Jensen (2 wheels)                    Ambulation/Gait Ambulation/Gait assistance: Modified independent  (Device/Increase time) Gait Distance (Feet): 180 Feet Assistive device: Rolling Angela Jensen (2 wheels) Gait Pattern/deviations: Step-through pattern Gait velocity: WFL     General Gait Details: ambulated on 4L O2 with spO2 dropped to 84% after 100'. With standing rest break x 1 minute and increase in O2 to 6L, patient able to increase back to 89%. Titrated back down to 4L for remaining distance with ability to maintain >87%.  Stairs            Wheelchair Mobility     Tilt Bed    Modified Rankin (Stroke Patients Only)       Balance Overall balance assessment: No apparent balance deficits (not formally assessed)                                           Pertinent Vitals/Pain Pain Assessment Pain Assessment: No/denies pain    Home Living Family/patient expects to be discharged to:: Private residence Living Arrangements: Spouse/significant other Available Help at Discharge: Family Type of Home: House Home Access: Stairs to enter Entrance Stairs-Rails: Right Entrance Stairs-Number of Steps: 1   Home Layout: Laundry or work area in basement;Two level;Able to live on main level with bedroom/bathroom Home Equipment: Rollator (4 wheels);Cane - single point;Shower seat Additional Comments: On 3-4L/min O2 at all times.    Prior Function Prior Level of Function : Independent/Modified Independent;Driving  Mobility Comments: PRN use of rollator, otherwise walks without any AD (primarily uses the rollator while cooking, so she can sit). Denies falls in the past 6 months ADLs Comments: Independent with bathing & dressing.     Extremity/Trunk Assessment   Upper Extremity Assessment Upper Extremity Assessment: Defer to OT evaluation    Lower Extremity Assessment Lower Extremity Assessment: Overall WFL for tasks assessed    Cervical / Trunk Assessment Cervical / Trunk Assessment: Normal  Communication   Communication Communication: No  apparent difficulties    Cognition Arousal: Alert Behavior During Therapy: WFL for tasks assessed/performed   PT - Cognitive impairments: No apparent impairments                         Following commands: Intact       Cueing       General Comments      Exercises     Assessment/Plan    PT Assessment Patient does not need any further PT services  PT Problem List Cardiopulmonary status limiting activity;Decreased activity tolerance;Decreased mobility       PT Treatment Interventions      PT Goals (Current goals can be found in the Care Plan section)  Acute Rehab PT Goals Patient Stated Goal: return home PT Goal Formulation: With patient    Frequency       Co-evaluation               AM-PAC PT 6 Clicks Mobility  Outcome Measure Help needed turning from your back to your side while in Jensen flat bed without using bedrails?: None Help needed moving from lying on your back to sitting on the side of Jensen flat bed without using bedrails?: None Help needed moving to and from Jensen bed to Jensen chair (including Jensen wheelchair)?: None Help needed standing up from Jensen chair using your arms (e.g., wheelchair or bedside chair)?: None Help needed to walk in hospital room?: None Help needed climbing 3-5 steps with Jensen railing? : Jensen Little 6 Click Score: 23    End of Session Equipment Utilized During Treatment: Oxygen  Activity Tolerance: Patient tolerated treatment well Patient left: in bed;with call bell/phone within reach Nurse Communication: Mobility status PT Visit Diagnosis: Muscle weakness (generalized) (M62.81);Difficulty in walking, not elsewhere classified (R26.2)    Time: 0946-1000 PT Time Calculation (min) (ACUTE ONLY): 14 min   Charges:   PT Evaluation $PT Eval Moderate Complexity: 1 Mod   PT General Charges $$ ACUTE PT VISIT: 1 Visit         Angela Jensen, PT, DPT Physical Therapist - [facility]  [facility]  Angela Jensen  Angela Jensen 04/11/2024, 10:17 AM

## 2024-04-12 DIAGNOSIS — R042 Hemoptysis: Secondary | ICD-10-CM | POA: Diagnosis not present

## 2024-04-12 DIAGNOSIS — J441 Chronic obstructive pulmonary disease with (acute) exacerbation: Secondary | ICD-10-CM

## 2024-04-12 DIAGNOSIS — I739 Peripheral vascular disease, unspecified: Secondary | ICD-10-CM | POA: Diagnosis not present

## 2024-04-12 DIAGNOSIS — J440 Chronic obstructive pulmonary disease with acute lower respiratory infection: Secondary | ICD-10-CM | POA: Diagnosis not present

## 2024-04-12 LAB — CBC
HCT: 30.4 % — ABNORMAL LOW (ref 36.0–46.0)
Hemoglobin: 9.4 g/dL — ABNORMAL LOW (ref 12.0–15.0)
MCH: 28.7 pg (ref 26.0–34.0)
MCHC: 30.9 g/dL (ref 30.0–36.0)
MCV: 93 fL (ref 80.0–100.0)
Platelets: 261 K/uL (ref 150–400)
RBC: 3.27 MIL/uL — ABNORMAL LOW (ref 3.87–5.11)
RDW: 14.9 % (ref 11.5–15.5)
WBC: 7.9 K/uL (ref 4.0–10.5)
nRBC: 0 % (ref 0.0–0.2)

## 2024-04-12 LAB — LEGIONELLA PNEUMOPHILA SEROGP 1 UR AG: L. pneumophila Serogp 1 Ur Ag: NEGATIVE

## 2024-04-12 LAB — BASIC METABOLIC PANEL WITH GFR
Anion gap: 10 (ref 5–15)
BUN: 25 mg/dL — ABNORMAL HIGH (ref 8–23)
CO2: 33 mmol/L — ABNORMAL HIGH (ref 22–32)
Calcium: 8.7 mg/dL — ABNORMAL LOW (ref 8.9–10.3)
Chloride: 95 mmol/L — ABNORMAL LOW (ref 98–111)
Creatinine, Ser: 0.74 mg/dL (ref 0.44–1.00)
GFR, Estimated: >60 mL/min (ref >60)
Glucose, Bld: 307 mg/dL — ABNORMAL HIGH (ref 70–99)
Potassium: 4 mmol/L (ref 3.5–5.1)
Sodium: 138 mmol/L (ref 135–145)

## 2024-04-12 LAB — GLUCOSE, CAPILLARY: Glucose-Capillary: 193 mg/dL — ABNORMAL HIGH (ref 70–99)

## 2024-04-12 MED ORDER — FERROUS SULFATE 325 (65 FE) MG PO TABS: 325.0000 mg | ORAL_TABLET | Freq: Every day | ORAL | Status: AC

## 2024-04-12 NOTE — Discharge Summary (Signed)
 Physician Discharge Summary   Patient: Angela Jensen MRN: [id number] DOB: [birth year]  Admit date:     04/09/2024  Discharge date: 04/12/24  Discharge Physician: Cresencio Fairly   PCP: Liana Fish, NP   Recommendations at discharge:    F/up with outpt providers as requested  Discharge Diagnoses: Principal Problem:   COPD (chronic obstructive pulmonary disease) (HCC) Active Problems:   COPD exacerbation (HCC)   PVD (peripheral vascular disease) (HCC)   Acute exacerbation of chronic obstructive pulmonary disease (COPD) (HCC)   Hemoptysis  Hospital Course: Assessment and Plan:  68 y.o. female with medical history significant of COPD Gold stage IV, chronic hypoxic respiratory failure on 4 L continuously, right-sided lung cancer status post radiation therapy, SSS status post pacemaker, chronic HFpEF, HTN, IDDM, presented with worsening of wheezing and shortness of breath.   Symptoms started 2 days ago, patient started to experience worsening of exertional dyspnea despite on oxygen .  She found only walking in side of her house causes significant shortness of breath.  Denies any chest pain no cough no fever or chills.  Patient has been followed with pulmonology for advanced lung problems and most recent PFT showed FEV1 24%, and patient was last seen by pulmonary 1 month ago when pulmonologist started patient on MWF Bactrim  as well as daily Decadron  to prevent future pneumonia and COPD exacerbation.  Despite the patient came back today for shortness of breath.   ED Course: Afebrile, nontachycardic blood pressure 180/80 O2 saturation 99% on 4 L.  Chest x-ray negative for acute findings, VBG showed 732/58/94, BUN 19 creatinine 0.5 bicarb 25, glucose 188 WBC 6.0.   Patient was given IV Solu-Medrol  and breathing treatment x 2 however continued experience dyspnea with short ambulating.     Acute COPD exacerbation: at baseline now. D/w her Pulmo and Onco. They're in agreement with the D/C plan and  so is the patient. She is on 3-4 liters O2 at DC.   Hemoptysis: once, I've requested her to hold off asa & plavix  for 48 hrs and resume if no further, close f/up with Pulmo & Onco - has appt next week   Acute on chronic hypoxic respiratory failure: on 3-4L Naomi at DC and also at home. Hx of chronic radiation lung fibrosis    HTN: Continue on home dose of losartan , metoprolol , HCTZ   Acute on chronic diastolic CHF: appears compensated. Continue on home dose of losartan , metoprolol     DM2: poorly controlled, HbA1c 8.0 in May 2025.    Hx of PAD: holding aspirin , plavix  for 48 hrs and if no further hemoptysis - she can resume this.       Disposition: Home Diet recommendation:  Discharge Diet Orders (From admission, onward)     Start     Ordered   04/12/24 0000  Diet - low sodium heart healthy        04/12/24 0822           Carb modified diet DISCHARGE MEDICATION: Allergies as of 04/12/2024       Reactions   Iodinated Contrast Media Itching   Trelegy Ellipta  [fluticasone -umeclidin-vilant]    Had breathing issues   Bactoshield Chg [chlorhexidine  Gluconate] Itching, Other (See Comments)   Dry. Flaking, peeling skin        Medication List     PAUSE taking these medications    aspirin  EC 81 MG tablet Wait to take this until: April 14, 2024 Take 1 tablet (81 mg total) by mouth daily. Swallow whole.  clopidogrel  75 MG tablet Wait to take this until: April 14, 2024 Evening Commonly known as: PLAVIX  Take 1 tablet (75 mg total) by mouth daily.       TAKE these medications    Accu-Chek Guide Test test strip Generic drug: glucose blood USE TO TEST BLOOD SUGAR 5 TIMES DAILY   Accu-Chek Guide w/Device Kit Use as directed Dx e11.65   Accu-Chek Softclix Lancets lancets Use 1 lancet 3 times daily to check glucose for diabetes   albuterol  (5 MG/ML) 0.5% nebulizer solution Commonly known as: PROVENTIL  Take 2.5 mg by nebulization every 6 (six) hours as needed for  wheezing or shortness of breath.   albuterol  108 (90 Base) MCG/ACT inhaler Commonly known as: VENTOLIN  HFA INHALE 2 PUFFS BY MOUTH EVERY 6 HOURS AS NEEDED FOR WHEEZING OR SHORTNESS OF BREATH   ALPRAZolam  0.5 MG tablet Commonly known as: XANAX  Take 1 tablet (0.5 mg total) by mouth 2 (two) times daily as needed for anxiety.   atorvastatin  10 MG tablet Commonly known as: Lipitor Take 1 tablet (10 mg total) by mouth daily.   calcium  carbonate 1500 (600 Ca) MG Tabs tablet Commonly known as: OSCAL Take 600 mg of elemental calcium  by mouth 2 (two) times daily with a meal.   cyclobenzaprine  10 MG tablet Commonly known as: FLEXERIL  Take 1 tablet (10 mg total) by mouth at bedtime. Take one tab po qhs for back spasm prn only What changed:  when to take this reasons to take this   dexamethasone  1 MG tablet Commonly known as: DECADRON  Take 1 mg by mouth.   Dulera  200-5 MCG/ACT Aero Generic drug: mometasone -formoterol  Inhale 2 puffs by mouth twice daily   escitalopram  10 MG tablet Commonly known as: LEXAPRO  Take 1 tablet (10 mg total) by mouth daily.   ferrous sulfate  325 (65 FE) MG tablet Take 1 tablet (325 mg total) by mouth daily with breakfast. What changed: when to take this   furosemide  20 MG tablet Commonly known as: LASIX  Take 1 tablet (20 mg total) by mouth daily as needed.   insulin  glargine (1 Unit Dial ) 300 UNIT/ML Solostar Pen Commonly known as: TOUJEO  Inject 10 Units into the skin daily.   ipratropium-albuterol  0.5-2.5 (3) MG/3ML Soln Commonly known as: DUONEB Take 3 mLs by nebulization every 6 (six) hours as needed.   losartan  25 MG tablet Commonly known as: COZAAR  Take 0.5 tablets (12.5 mg total) by mouth daily.   metFORMIN  500 MG 24 hr tablet Commonly known as: GLUCOPHAGE -XR TAKE 2 TABLETS BY MOUTH ONCE DAILY WITH BREAKFAST   metoprolol  succinate 50 MG 24 hr tablet Commonly known as: TOPROL -XL Take 1 tablet (50 mg total) by mouth daily. Take with or  immediately following a meal.   oxyCODONE -acetaminophen  5-325 MG tablet Commonly known as: PERCOCET/ROXICET Take 1 tablet by mouth every 6 (six) hours as needed for severe pain (pain score 7-10).   OXYGEN  Inhale 4 L into the lungs. PT USES [facility] FOR OXYGEN    pantoprazole  40 MG tablet Commonly known as: Protonix  Take 1 tablet (40 mg total) by mouth daily.   potassium chloride  10 MEQ tablet Commonly known as: KLOR-CON  TAKE 1 TABLET BY MOUTH EVERY OTHER DAY   pregabalin  75 MG capsule Commonly known as: Lyrica  Take 1 capsule (75 mg total) by mouth 2 (two) times daily.   ReliOn Pen Needles 31G X 6 MM Misc Generic drug: Insulin  Pen Needle USE 1 PEN NEEDLE WITH INSULIN  PEN ONCE DAILY FOR DIABETES   Spiriva  HandiHaler  18 MCG inhalation capsule Generic drug: tiotropium INHALE THE CONTENTS OF 1 CAPSULES BY MOUTH ONCE DAILY - DO NOT SWALLOW CAPSULES   sulfamethoxazole -trimethoprim  400-80 MG tablet Commonly known as: BACTRIM  Take 1 tablet by mouth 3 (three) times a week. Monday, Wednesday, Friday   vitamin B-12 100 MCG tablet Commonly known as: CYANOCOBALAMIN  Take 100 mcg by mouth daily.   VITAMIN D  (CHOLECALCIFEROL ) PO Take 1 tablet by mouth in the morning.        Follow-up Information     Liana Fish, NP. Go on 04/18/2024.   Specialty: Nurse Practitioner Why: at [facility] Discharge F/UP Contact information: [address] [place] [address] [phone]         Babara Call, MD. Go on 04/18/2024.   Specialty: Oncology Why: as scheduled Contact information: [address] [place] [address] [phone]         Parris Manna, MD. Go on 04/20/2024.   Specialty: Pulmonary Disease Why: @1 :15p [facility] Discharge F/UP Contact information: [address] [place] [address] [phone]                Discharge Exam: Angela Jensen   04/09/24 0820  Weight: 53.1 kg   General exam: appears comfortable   Respiratory system: decreased breath sounds b/l  Cardiovascular system: S1/S2+. No rubs or clicks   Gastrointestinal system: abd is soft, NT, ND & hypoactive bowel sounds  Central nervous system: alert & oriented. Moves all extremities   Psychiatry: Judgement and insight appears at baseline. Normal  mood and affect   Condition at discharge: good  The results of significant diagnostics from this hospitalization (including imaging, microbiology, ancillary and laboratory) are listed below for reference.   Imaging Studies: DG Chest Portable 1 View Result Date: 04/09/2024 CLINICAL DATA:  Shortness of breath.  COPD.  Hypoxia. EXAM: PORTABLE CHEST 1 VIEW COMPARISON:  03/31/2024 FINDINGS: Stable mild cardiomegaly. Pulmonary hyperinflation is seen, consistent with COPD. Airspace opacity in the right lower lobe and small right pleural effusion show no significant change. No new or worsening areas of opacity are seen. IMPRESSION: No significant change in right lower lobe airspace opacity and small right pleural effusion, suspicious for pneumonia. COPD. Electronically Signed   By: Norleen DELENA Kil M.D.   On: 04/09/2024 09:57   DG Chest 1 View Result Date: 03/31/2024 CLINICAL DATA:  Shortness of breath EXAM: CHEST  1 VIEW COMPARISON:  03/19/2024 FINDINGS: Heart and mediastinal contours within normal limits. Consolidation noted in the right lower lobe and to a lesser extent left lower lobe compatible with pneumonia. Findings similar to prior study. No significant effusion or pneumothorax. No acute bony abnormality. IMPRESSION: Bilateral lower lobe airspace opacities, right greater than left compatible with pneumonia. No real change since prior study. Electronically Signed   By: Franky Crease M.D.   On: 03/31/2024 23:24   CT Angio Chest Pulmonary Embolism (PE) W or WO Contrast Result Date: 03/23/2024 EXAM: CTA of the Chest with contrast for PE 03/23/2024 07:47:00 AM TECHNIQUE: CTA of the chest was performed after the  administration of intravenous contrast. Multiplanar reformatted images are provided for review. MIP images are provided for review. Automated exposure control, iterative reconstruction, and/or weight based adjustment of the mA/kV was utilized to reduce the radiation dose to as low as reasonably achievable. COMPARISON: CT pulmonary angiogram dated 01/28/2024. CLINICAL HISTORY: Dyspnea, chronic, chest wall or pleura disease suspected; Prior lung cancer, recurrent pneumothorax, known COPD, has been treated for pneumonia during this admission. Ongoing  hemoptysis and dyspnea. Concern for malignancy vs PE. Allergic patient received 13-hour allergy prep; Hospital course: KAYLIANNA DETERT is a 68 year old female with COPD on 4 L nasal cannula at baseline, right lung cancer status post radiation therapy, recurrent right pneumothorax x 2 most recently August 2024, history of heart failure with preserved EF 50%, hypertension, diabetes, PAD, was admitted with acute respiratory failure requiring BiPAP. On EMS arrival to the home she was saturating in the 50s on room air and endorsed recent fever and cough. She was admitted for treatment of community-acquired pneumonia. FINDINGS: PULMONARY ARTERIES: Pulmonary arteries are adequately opacified for evaluation. No pulmonary embolism. MEDIASTINUM: The heart is borderline size. A leadless cardiac pacemaker is noted within the right ventricle. LYMPH NODES: No mediastinal, hilar or axillary lymphadenopathy. LUNGS AND PLEURA: There are patchy and coarse areas of opacification/consolidation within the right lower lobe. There is moderate central lobular emphysema. There is mild right-sided pleural effusion/pleural thickening. UPPER ABDOMEN: Limited images of the upper abdomen are unremarkable. SOFT TISSUES AND BONES: No acute bone or soft tissue abnormality. IMPRESSION: 1. No evidence of pulmonary embolism. 2. Patchy and coarse areas of opacification/consolidation within the right lower lobe,  possibly related to recent pneumonia. 3. Mild right-sided pleural effusion/pleural thickening. 4. Moderate central lobular emphysema. Electronically signed by: evalene coho 03/23/2024 08:14 AM EDT RP Workstation: [id number]   DG Chest Port 1 View Result Date: 03/19/2024 CLINICAL DATA:  Respiratory distress peer EXAM: PORTABLE CHEST 1 VIEW COMPARISON:  January 29, 2024 FINDINGS: The heart size and mediastinal contours are within normal limits. There is evidence of emphysematous lung disease. Mild left basilar and mild to moderate severity right basilar airspace disease is noted. There is a small right pleural effusion. No pneumothorax is identified. The visualized skeletal structures are unremarkable. IMPRESSION: 1. Mild left basilar and mild to moderate severity right basilar airspace disease. 2. Small right pleural effusion. Electronically Signed   By: Suzen Dials M.D.   On: 03/19/2024 23:16    Microbiology: Results for orders placed or performed during the hospital encounter of 04/09/24  Resp panel by RT-PCR (RSV, Flu A&B, Covid) Anterior Nasal Swab     Status: None   Collection Time: 04/09/24  8:50 AM   Specimen: Anterior Nasal Swab  Result Value Ref Range Status   SARS Coronavirus 2 by RT PCR NEGATIVE NEGATIVE Final    Comment: (NOTE) SARS-CoV-2 target nucleic acids are NOT DETECTED.  The SARS-CoV-2 RNA is generally detectable in upper respiratory specimens during the acute phase of infection. The lowest concentration of SARS-CoV-2 viral copies this assay can detect is 138 copies/mL. A negative result does not preclude SARS-Cov-2 infection and should not be used as the sole basis for treatment or other patient management decisions. A negative result may occur with  improper specimen collection/handling, submission of specimen other than nasopharyngeal swab, presence of viral mutation(s) within the areas targeted by this assay, and inadequate number of viral copies(<138 copies/mL).  A negative result must be combined with clinical observations, patient history, and epidemiological information. The expected result is Negative.  Fact Sheet for Patients:  BloggerCourse.com  Fact Sheet for Healthcare Providers:  SeriousBroker.it  This test is no t yet approved or cleared by the [place]  FDA and  has been authorized for detection and/or diagnosis of SARS-CoV-2 by FDA under an Emergency Use Authorization (EUA). This EUA will remain  in effect (meaning this test can be used) for the duration of the COVID-19 declaration under Section 564(b)(1) of the  Act, 21 U.S.C.section 360bbb-3(b)(1), unless the authorization is terminated  or revoked sooner.       Influenza A by PCR NEGATIVE NEGATIVE Final   Influenza B by PCR NEGATIVE NEGATIVE Final    Comment: (NOTE) The Xpert Xpress SARS-CoV-2/FLU/RSV plus assay is intended as an aid in the diagnosis of influenza from Nasopharyngeal swab specimens and should not be used as a sole basis for treatment. Nasal washings and aspirates are unacceptable for Xpert Xpress SARS-CoV-2/FLU/RSV testing.  Fact Sheet for Patients: BloggerCourse.com  Fact Sheet for Healthcare Providers: SeriousBroker.it  This test is not yet approved or cleared by the [place]  FDA and has been authorized for detection and/or diagnosis of SARS-CoV-2 by FDA under an Emergency Use Authorization (EUA). This EUA will remain in effect (meaning this test can be used) for the duration of the COVID-19 declaration under Section 564(b)(1) of the Act, 21 U.S.C. section 360bbb-3(b)(1), unless the authorization is terminated or revoked.     Resp Syncytial Virus by PCR NEGATIVE NEGATIVE Final    Comment: (NOTE) Fact Sheet for Patients: BloggerCourse.com  Fact Sheet for Healthcare  Providers: SeriousBroker.it  This test is not yet approved or cleared by the [place]  FDA and has been authorized for detection and/or diagnosis of SARS-CoV-2 by FDA under an Emergency Use Authorization (EUA). This EUA will remain in effect (meaning this test can be used) for the duration of the COVID-19 declaration under Section 564(b)(1) of the Act, 21 U.S.C. section 360bbb-3(b)(1), unless the authorization is terminated or revoked.  Performed at [facility], [address] [place], [place] [address]    *Note: Due to a large number of results and/or encounters for the requested time period, some results have not been displayed. A complete set of results can be found in Results Review.    Labs: CBC: Recent Labs  Lab 04/09/24 0834 04/11/24 0359 04/12/24 0312  WBC 6.0 8.8 7.9  NEUTROABS 4.3  --   --   HGB 9.6* 9.1* 9.4*  HCT 33.6* 30.1* 30.4*  MCV 98.5 92.6 93.0  PLT 201 245 261   Basic Metabolic Panel: Recent Labs  Lab 04/09/24 0834 04/11/24 0359 04/12/24 0312  NA 140 140 138  K 4.2 3.7 4.0  CL 104 98 95*  CO2 27 33* 33*  GLUCOSE 188* 176* 307*  BUN 19 27* 25*  CREATININE 0.57 0.55 0.74  CALCIUM  8.1* 9.1 8.7*   Liver Function Tests: No results for input(s): AST, ALT, ALKPHOS, BILITOT, PROT, ALBUMIN in the last 168 hours. CBG: Recent Labs  Lab 04/11/24 0746 04/11/24 1157 04/11/24 1601 04/11/24 1951 04/12/24 0728  GLUCAP 149* 208* 414* 138* 193*    Discharge time spent: greater than 30 minutes.  Signed: Cresencio Fairly, MD [facility] 04/12/2024

## 2024-04-12 NOTE — Plan of Care (Signed)

## 2024-04-13 ENCOUNTER — Telehealth: Payer: Self-pay

## 2024-04-13 ENCOUNTER — Encounter

## 2024-04-13 VITALS — Ht 59.72 in | Wt 116.2 lb

## 2024-04-13 DIAGNOSIS — J449 Chronic obstructive pulmonary disease, unspecified: Secondary | ICD-10-CM

## 2024-04-13 NOTE — Progress Notes (Signed)
 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 classification (HCC)  Patient's Home Medications on Admission:  Current Outpatient Medications:    ACCU-CHEK GUIDE TEST test strip, USE TO TEST BLOOD SUGAR 5 TIMES DAILY, Disp: 450 each, Rfl: 0   Accu-Chek Softclix Lancets lancets, Use 1 lancet 3 times daily to check glucose for diabetes, Disp: 300 each, Rfl: 1   albuterol  (PROVENTIL ) (5 MG/ML) 0.5% nebulizer solution, Take 2.5 mg by nebulization every 6 (six) hours as needed for wheezing or shortness of breath., Disp: , Rfl:    albuterol  (VENTOLIN  HFA) 108 (90 Base) MCG/ACT inhaler, INHALE 2 PUFFS BY MOUTH EVERY 6 HOURS AS NEEDED FOR WHEEZING OR SHORTNESS OF BREATH, Disp: 3 each, Rfl: 5   ALPRAZolam  (XANAX ) 0.5 MG tablet, Take 1 tablet (0.5 mg total) by mouth 2 (two) times daily as needed for anxiety., Disp: 60 tablet, Rfl: 2   [Paused] aspirin  EC 81 MG tablet, Take 1 tablet (81 mg total) by mouth daily. Swallow whole., Disp: 150 tablet, Rfl: 2   atorvastatin  (LIPITOR) 10 MG tablet, Take 1 tablet (10 mg total) by mouth daily., Disp: 30 tablet, Rfl: 11   Blood Glucose Monitoring Suppl (ACCU-CHEK GUIDE) w/Device KIT, Use as directed Dx e11.65, Disp: 1 kit, Rfl: 0   calcium  carbonate (OSCAL) 1500 (600 Ca) MG TABS tablet, Take 600 mg of elemental calcium  by mouth 2 (two) times daily with a meal., Disp: , Rfl:    [Paused] clopidogrel  (PLAVIX ) 75 MG tablet, Take 1 tablet (75 mg total) by mouth daily., Disp: 90 tablet, Rfl: 3   cyclobenzaprine  (FLEXERIL ) 10 MG tablet, Take 1 tablet (10 mg total) by mouth at bedtime.  Take one tab po qhs for back spasm prn only (Patient taking differently: Take 10 mg by mouth at bedtime as needed for muscle spasms. Take one tab po qhs for back spasm prn only), Disp: 30 tablet, Rfl: 3   dexamethasone  (DECADRON ) 1 MG tablet, Take 1 mg by mouth., Disp: , Rfl:    escitalopram  (LEXAPRO ) 10 MG tablet, Take 1 tablet (10 mg total) by mouth daily., Disp: 90 tablet, Rfl: 1   ferrous sulfate  325 (65 FE) MG tablet, Take 1 tablet (325 mg total) by mouth daily with breakfast., Disp: , Rfl:    furosemide  (LASIX ) 20 MG tablet, Take 1 tablet (20 mg total) by mouth daily as needed., Disp: 30 tablet, Rfl: 5   insulin  glargine, 1 Unit Dial , (TOUJEO ) 300 UNIT/ML Solostar Pen, Inject 10 Units into the skin daily., Disp: 1.5 mL, Rfl: 11   Insulin  Pen Needle (RELION PEN NEEDLES) 31G X 6 MM MISC, USE 1 PEN NEEDLE WITH INSULIN  PEN ONCE DAILY FOR DIABETES, Disp: 50 each, Rfl: 3   ipratropium-albuterol  (DUONEB) 0.5-2.5 (3) MG/3ML SOLN, Take 3 mLs by nebulization every 6 (six) hours as needed., Disp: 360 mL, Rfl: 0   losartan  (COZAAR ) 25 MG tablet, Take 0.5 tablets (12.5 mg total) by mouth daily., Disp: 30 tablet, Rfl: 0   metFORMIN  (GLUCOPHAGE -XR) 500 MG 24 hr tablet, TAKE 2 TABLETS BY MOUTH ONCE DAILY WITH BREAKFAST, Disp: 180 tablet, Rfl: 0  metoprolol  succinate (TOPROL -XL) 50 MG 24 hr tablet, Take 1 tablet (50 mg total) by mouth daily. Take with or immediately following a meal., Disp: 90 tablet, Rfl: 1   mometasone -formoterol  (DULERA ) 200-5 MCG/ACT AERO, Inhale 2 puffs by mouth twice daily, Disp: 13 g, Rfl: 12   oxyCODONE -acetaminophen  (PERCOCET/ROXICET) 5-325 MG tablet, Take 1 tablet by mouth every 6 (six) hours as needed for severe pain (pain score 7-10)., Disp: 30 tablet, Rfl: 0   OXYGEN , Inhale 4 L into the lungs. PT USES [facility] FOR OXYGEN , Disp: , Rfl:    pantoprazole  (PROTONIX ) 40 MG tablet, Take 1 tablet (40 mg total) by mouth daily., Disp: 30 tablet, Rfl: 0   potassium chloride  (KLOR-CON ) 10  MEQ tablet, TAKE 1 TABLET BY MOUTH EVERY OTHER DAY, Disp: 45 tablet, Rfl: 0   pregabalin  (LYRICA ) 75 MG capsule, Take 1 capsule (75 mg total) by mouth 2 (two) times daily., Disp: 60 capsule, Rfl: 2   SPIRIVA  HANDIHALER 18 MCG inhalation capsule, INHALE THE CONTENTS OF 1 CAPSULES BY MOUTH ONCE DAILY - DO NOT SWALLOW CAPSULES, Disp: 90 capsule, Rfl: 3   sulfamethoxazole -trimethoprim  (BACTRIM ) 400-80 MG tablet, Take 1 tablet by mouth 3 (three) times a week. Monday, Wednesday, Friday, Disp: , Rfl:    vitamin B-12 (CYANOCOBALAMIN ) 100 MCG tablet, Take 100 mcg by mouth daily., Disp: , Rfl:    VITAMIN D , CHOLECALCIFEROL , PO, Take 1 tablet by mouth in the morning., Disp: , Rfl:   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/2016   Cancer (HCC) 2012   Right Lung CA   COPD (chronic obstructive pulmonary disease) (HCC)    Depression    Diabetes mellitus without complication (HCC)    Patient takes Janumet   Essential hypertension 05/26/2016   Heart failure (HCC) 2022   Hydropneumothorax 05/03/2020   Hypercholesteremia    Oxygen  dependent    2L at nite    PAD (peripheral artery disease) (HCC) 06/22/2016   Peripheral vascular disease (HCC)    Personal history of radiation therapy    Shortness of breath dyspnea    with exertion    Sialolithiasis    Sleep apnea    Wears dentures    full upper and lower    Tobacco Use: Social History   Tobacco Use  Smoking Status Former   Current packs/day: 0.00   Average packs/day: 1 pack/day for 37.0 years (37.0 ttl pk-yrs)   Types: Cigarettes   Start date: 02/06/1973   Quit date: 02/06/2010   Years since quitting: 14.1  Smokeless Tobacco Former   Types: Snuff    Labs: Review Flowsheet  More data exists      Latest Ref Rng & Units 06/15/2023 10/04/2023 01/12/2024 01/27/2024 04/09/2024  Labs for ITP Cardiac  and Pulmonary Rehab  Cholestrol 100 - 199 mg/dL - 856  - - -  LDL (calc) 0 - 99 mg/dL - 75  - - -  HDL-C >60 mg/dL - 53  - - -  Trlycerides 0 - 149 mg/dL - 74  - - -  Hemoglobin A1c 4.0 - 5.6 % - 8.9  8.0  - -  PH, Arterial 7.35 - 7.45 7.39  - - - -  PCO2 arterial 32 - 48 mmHg 55  - - - -  Bicarbonate 20.0 - 28.0 mmol/L 33.3  - - 40.2  29.9  O2 Saturation % 75.8  - - 41.6  99.1      Pulmonary Assessment Scores:  Pulmonary Assessment Scores     Row Name 04/13/24 1624         ADL UCSD   ADL Phase Entry     SOB Score total 71     Rest 0     Walk 3     Stairs 4     Bath 4     Dress 3     Shop 3       CAT Score   CAT Score 26       mMRC Score   mMRC Score 2        UCSD: Self-administered rating of dyspnea associated with activities of daily living (ADLs) 6-point scale (0 = not at all to 5 = maximal or unable to do because of breathlessness)  Scoring Scores range from 0 to 120.  Minimally important difference is 5 units  CAT: CAT can identify the health impairment of COPD patients and is better correlated with disease progression.  CAT has a scoring range of zero to 40. The CAT score is classified into four groups of low (less than 10), medium (10 - 20), high (21-30) and very high (31-40) based on the impact level of disease on health status. A CAT score over 10 suggests significant symptoms.  A worsening CAT score could be explained by an exacerbation, poor medication adherence, poor inhaler technique, or progression of COPD or comorbid conditions.  CAT MCID is 2 points  mMRC: mMRC (Modified Medical Research Council) Dyspnea Scale is used to assess the degree of baseline functional disability in patients of respiratory disease due to dyspnea. No minimal important difference is established. A decrease in score of 1 point or greater is considered a positive change.   Pulmonary Function Assessment:   Exercise Target Goals: Exercise Program Goal: Individual  exercise prescription set using results from initial 6 min walk test and THRR while considering  patient's activity barriers and safety.   Exercise Prescription Goal: Initial exercise prescription builds to 30-45 minutes a day of aerobic activity, 2-3 days per week.  Home exercise guidelines will be given to patient during program as part of exercise prescription that the participant will acknowledge.  Education: Aerobic Exercise: - Group verbal and visual presentation on the components of exercise prescription. Introduces F.I.T.T principle from ACSM for exercise prescriptions.  Reviews F.I.T.T. principles of aerobic exercise including progression. Written material provided at class time.   Education: Resistance Exercise: - Group verbal and visual presentation on the components of exercise prescription. Introduces F.I.T.T principle from ACSM for exercise prescriptions  Reviews F.I.T.T. principles of resistance exercise including progression. Written material provided at class time.    Education: Exercise & Equipment Safety: - Individual verbal instruction and demonstration of equipment use and safety with use of the equipment. Flowsheet Row Pulmonary Rehab from 04/13/2024 in [facility] Cardiac and Pulmonary Rehab  Date 04/13/24  Educator MB  Instruction Review Code 1- Verbalizes Understanding    Education: Exercise Physiology & General Exercise Guidelines: - Group verbal and written instruction with models to review the exercise physiology of the cardiovascular system and associated critical values. Provides general exercise guidelines with specific guidelines to those with heart or lung disease.    Education: Flexibility, Balance, Mind/Body Relaxation: - Group verbal and visual presentation with interactive activity on the components of exercise prescription. Introduces F.I.T.T principle from ACSM for exercise prescriptions. Reviews F.I.T.T. principles of flexibility  and balance exercise training  including progression. Also discusses the mind body connection.  Reviews various relaxation techniques to help reduce and manage stress (i.e. Deep breathing, progressive muscle relaxation, and visualization). Balance handout provided to take home. Written material provided at class time.   Activity Barriers & Risk Stratification:  Activity Barriers & Cardiac Risk Stratification - 04/13/24 1620       Activity Barriers & Cardiac Risk Stratification   Activity Barriers Joint Problems;Balance Concerns;Other (comment);Assistive Device    Comments recent stent in L leg causing numbness          6 Minute Walk:  6 Minute Walk     Row Name 04/13/24 1615         6 Minute Walk   Phase Initial     Distance 620 feet     Walk Time 6 minutes     # of Rest Breaks 0     MPH 1.17     METS 1.97     RPE 11     Perceived Dyspnea  0     VO2 Peak 6.89     Symptoms No     Resting HR 78 bpm     Resting BP 106/60     Resting Oxygen  Saturation  92 %     Exercise Oxygen  Saturation  during 6 min walk 86 %     Max Ex. HR 96 bpm     Max Ex. BP 148/72     2 Minute Post BP 132/72       Interval HR   1 Minute HR 94     2 Minute HR 94     3 Minute HR 94     4 Minute HR 96     5 Minute HR 90     6 Minute HR 95     2 Minute Post HR 76     Interval Heart Rate? Yes       Interval Oxygen    Interval Oxygen ? Yes     Baseline Oxygen  Saturation % 92 %     1 Minute Oxygen  Saturation % 91 %     1 Minute Liters of Oxygen  4 L     2 Minute Oxygen  Saturation % 87 %     2 Minute Liters of Oxygen  4 L     3 Minute Oxygen  Saturation % 86 %     3 Minute Liters of Oxygen  4 L     4 Minute Oxygen  Saturation % 88 %     4 Minute Liters of Oxygen  4 L     5 Minute Oxygen  Saturation % 87 %     5 Minute Liters of Oxygen  4 L     6 Minute Oxygen  Saturation % 87 %     6 Minute Liters of Oxygen  4 L     2 Minute Post Oxygen  Saturation % 95 %     2 Minute Post Liters of Oxygen  4 L       Oxygen  Initial Assessment:   Oxygen  Initial Assessment - 04/07/24 1308       Home Oxygen    Home Oxygen  Device Home Concentrator    Sleep Oxygen  Prescription Continuous    Liters per minute 3.5    Home Exercise Oxygen  Prescription Continuous    Liters per minute 4    Home Resting Oxygen  Prescription Continuous    Liters per minute 4    Compliance with Home Oxygen  Use Yes  Intervention   Short Term Goals To learn and exhibit compliance with exercise, home and travel O2 prescription;To learn and understand importance of monitoring SPO2 with pulse oximeter and demonstrate accurate use of the pulse oximeter.;To learn and understand importance of maintaining oxygen  saturations>88%;To learn and demonstrate proper pursed lip breathing techniques or other breathing techniques. ;To learn and demonstrate proper use of respiratory medications    Long  Term Goals Verbalizes importance of monitoring SPO2 with pulse oximeter and return demonstration;Exhibits compliance with exercise, home  and travel O2 prescription;Maintenance of O2 saturations>88%;Exhibits proper breathing techniques, such as pursed lip breathing or other method taught during program session;Compliance with respiratory medication;Demonstrates proper use of MDI's          Oxygen  Re-Evaluation:   Oxygen  Discharge (Final Oxygen  Re-Evaluation):   Initial Exercise Prescription:  Initial Exercise Prescription - 04/13/24 1600       Date of Initial Exercise RX and Referring Provider   Date 04/13/24    Referring Provider Parris Manna, MD      Oxygen    Oxygen  Continuous    Liters 4    Maintain Oxygen  Saturation 88% or higher      Recumbant Bike   Level 1    RPM 50    Watts 15    Minutes 15    METs 1.97      NuStep   Level 1    SPM 80    Minutes 15    METs 1.97      Track   Laps 17    Minutes 15    METs 1.92      Prescription Details   Frequency (times per week) 2    Duration Progress to 30 minutes of continuous aerobic without  signs/symptoms of physical distress      Intensity   THRR 40-80% of Max Heartrate 107-137    Ratings of Perceived Exertion 11-13    Perceived Dyspnea 0-4      Progression   Progression Continue to progress workloads to maintain intensity without signs/symptoms of physical distress.      Resistance Training   Training Prescription Yes    Weight 3 lb    Reps 10-15          Perform Capillary Blood Glucose checks as needed.  Exercise Prescription Changes:   Exercise Prescription Changes     Row Name 04/13/24 1600             Response to Exercise   Blood Pressure (Admit) 106/60       Blood Pressure (Exercise) 148/72       Blood Pressure (Exit) 132/72       Heart Rate (Admit) 78 bpm       Heart Rate (Exercise) 96 bpm       Heart Rate (Exit) 76 bpm       Oxygen  Saturation (Admit) 92 %       Oxygen  Saturation (Exercise) 86 %       Oxygen  Saturation (Exit) 95 %       Rating of Perceived Exertion (Exercise) 11       Perceived Dyspnea (Exercise) 0       Symptoms none       Comments results         Progression   Average METs 1.97          Exercise Comments:   Exercise Goals and Review:   Exercise Goals     Row Name 04/13/24 1623  Exercise Goals   Increase Physical Activity Yes       Intervention Provide advice, education, support and counseling about physical activity/exercise needs.;Develop an individualized exercise prescription for aerobic and resistive training based on initial evaluation findings, risk stratification, comorbidities and participant's personal goals.       Expected Outcomes Short Term: Attend rehab on a regular basis to increase amount of physical activity.;Long Term: Add in home exercise to make exercise part of routine and to increase amount of physical activity.;Long Term: Exercising regularly at least 3-5 days a week.       Increase Strength and Stamina Yes       Intervention Provide advice, education, support and  counseling about physical activity/exercise needs.;Develop an individualized exercise prescription for aerobic and resistive training based on initial evaluation findings, risk stratification, comorbidities and participant's personal goals.       Expected Outcomes Short Term: Increase workloads from initial exercise prescription for resistance, speed, and METs.;Short Term: Perform resistance training exercises routinely during rehab and add in resistance training at home;Long Term: Improve cardiorespiratory fitness, muscular endurance and strength as measured by increased METs and functional capacity ( )       Able to understand and use rate of perceived exertion (RPE) scale Yes       Intervention Provide education and explanation on how to use RPE scale       Expected Outcomes Short Term: Able to use RPE daily in rehab to express subjective intensity level;Long Term:  Able to use RPE to guide intensity level when exercising independently       Able to understand and use Dyspnea scale Yes       Intervention Provide education and explanation on how to use Dyspnea scale       Expected Outcomes Long Term: Able to use Dyspnea scale to guide intensity level when exercising independently;Short Term: Able to use Dyspnea scale daily in rehab to express subjective sense of shortness of breath during exertion       Knowledge and understanding of Target Heart Rate Range (THRR) Yes       Intervention Provide education and explanation of THRR including how the numbers were predicted and where they are located for reference       Expected Outcomes Short Term: Able to state/look up THRR;Short Term: Able to use daily as guideline for intensity in rehab;Long Term: Able to use THRR to govern intensity when exercising independently       Able to check pulse independently Yes       Intervention Provide education and demonstration on how to check pulse in carotid and radial arteries.;Review the importance of being able to  check your own pulse for safety during independent exercise       Expected Outcomes Short Term: Able to explain why pulse checking is important during independent exercise;Long Term: Able to check pulse independently and accurately       Understanding of Exercise Prescription Yes       Intervention Provide education, explanation, and written materials on patient's individual exercise prescription       Expected Outcomes Short Term: Able to explain program exercise prescription;Long Term: Able to explain home exercise prescription to exercise independently          Exercise Goals Re-Evaluation :   Discharge Exercise Prescription (Final Exercise Prescription Changes):  Exercise Prescription Changes - 04/13/24 1600       Response to Exercise   Blood Pressure (Admit) 106/60    Blood  Pressure (Exercise) 148/72    Blood Pressure (Exit) 132/72    Heart Rate (Admit) 78 bpm    Heart Rate (Exercise) 96 bpm    Heart Rate (Exit) 76 bpm    Oxygen  Saturation (Admit) 92 %    Oxygen  Saturation (Exercise) 86 %    Oxygen  Saturation (Exit) 95 %    Rating of Perceived Exertion (Exercise) 11    Perceived Dyspnea (Exercise) 0    Symptoms none    Comments results      Progression   Average METs 1.97          Nutrition:  Target Goals: Understanding of nutrition guidelines, daily intake of sodium 1500mg , cholesterol 200mg , calories 30% from fat and 7% or less from saturated fats, daily to have 5 or more servings of fruits and vegetables.  Education: Nutrition 1 -Group instruction provided by verbal, written material, interactive activities, discussions, models, and posters to present general guidelines for heart healthy nutrition including macronutrients, label reading, and promoting whole foods over processed counterparts. Education serves as Pensions consultant of discussion of heart healthy eating for all. Written material provided at class time.     Education: Nutrition 2 -Group instruction  provided by verbal, written material, interactive activities, discussions, models, and posters to present general guidelines for heart healthy nutrition including sodium, cholesterol, and saturated fat. Providing guidance of habit forming to improve blood pressure, cholesterol, and body weight. Written material provided at class time.     Biometrics:  Pre Biometrics - 04/13/24 1623       Pre Biometrics   Height 4' 11.72 (1.517 m)    Weight 116 lb 3.2 oz (52.7 kg)    Waist Circumference 35 inches    Hip Circumference 37 inches    Waist to Hip Ratio 0.95 %    BMI (Calculated) 22.9    Single Leg Stand 21 seconds           Nutrition Therapy Plan and Nutrition Goals:  Nutrition Therapy & Goals - 04/13/24 1625       Nutrition Therapy   RD appointment deferred Yes      Personal Nutrition Goals   Nutrition Goal RD appointment deferred at this time      Intervention Plan   Intervention Prescribe, educate and counsel regarding individualized specific dietary modifications aiming towards targeted core components such as weight, hypertension, lipid management, diabetes, heart failure and other comorbidities.    Expected Outcomes Short Term Goal: Understand basic principles of dietary content, such as calories, fat, sodium, cholesterol and nutrients.          Nutrition Assessments:  MEDIFICTS Score Key: >=70 Need to make dietary changes  40-70 Heart Healthy Diet <= 40 Therapeutic Level Cholesterol Diet  Flowsheet Row Pulmonary Rehab from 04/13/2024 in [facility] Cardiac and Pulmonary Rehab  Picture Your Plate Total Score on Admission 61   Picture Your Plate Scores: <59 Unhealthy dietary pattern with much room for improvement. 41-50 Dietary pattern unlikely to meet recommendations for good health and room for improvement. 51-60 More healthful dietary pattern, with some room for improvement.  >60 Healthy dietary pattern, although there may be some specific behaviors that could be  improved.   Nutrition Goals Re-Evaluation:   Nutrition Goals Discharge (Final Nutrition Goals Re-Evaluation):   Psychosocial: Target Goals: Acknowledge presence or absence of significant depression and/or stress, maximize coping skills, provide positive support system. Participant is able to verbalize types and ability to use techniques and skills needed for reducing stress and  depression.   Education: Stress, Anxiety, and Depression - Group verbal and visual presentation to define topics covered.  Reviews how body is impacted by stress, anxiety, and depression.  Also discusses healthy ways to reduce stress and to treat/manage anxiety and depression.  Written material provided at class time.   Education: Sleep Hygiene -Provides group verbal and written instruction about how sleep can affect your health.  Define sleep hygiene, discuss sleep cycles and impact of sleep habits. Review good sleep hygiene tips.    Initial Review & Psychosocial Screening:  Initial Psych Review & Screening - 04/07/24 1310       Initial Review   Current issues with Current Stress Concerns;Current Anxiety/Panic    Source of Stress Concerns Chronic Illness      Family Dynamics   Good Support System? Yes      Barriers   Psychosocial barriers to participate in program There are no identifiable barriers or psychosocial needs.      Screening Interventions   Interventions Encouraged to exercise;Provide feedback about the scores to participant    Expected Outcomes Short Term goal: Utilizing psychosocial counselor, staff and physician to assist with identification of specific Stressors or current issues interfering with healing process. Setting desired goal for each stressor or current issue identified.;Long Term Goal: Stressors or current issues are controlled or eliminated.;Short Term goal: Identification and review with participant of any Quality of Life or Depression concerns found by scoring the  questionnaire.;Long Term goal: The participant improves quality of Life and PHQ9 Scores as seen by post scores and/or verbalization of changes          Quality of Life Scores:  Scores of 19 and below usually indicate a poorer quality of life in these areas.  A difference of  2-3 points is a clinically meaningful difference.  A difference of 2-3 points in the total score of the Quality of Life Index has been associated with significant improvement in overall quality of life, self-image, physical symptoms, and general health in studies assessing change in quality of life.  PHQ-9: Review Flowsheet  More data exists      04/13/2024 03/28/2024 10/13/2023 08/12/2023 10/08/2022  Depression screen PHQ 2/9  Decreased Interest 0 0 0 0 0  Down, Depressed, Hopeless 1 0 0 0 0  PHQ - 2 Score 1 0 0 0 0  Altered sleeping 3 - - - -  Tired, decreased energy 2 - - - -  Change in appetite 3 - - - -  Feeling bad or failure about yourself  0 - - - -  Trouble concentrating 0 - - - -  Moving slowly or fidgety/restless 0 - - - -  Suicidal thoughts 0 - - - -  PHQ-9 Score 9 - - - -  Difficult doing work/chores Very difficult - - - -   Interpretation of Total Score  Total Score Depression Severity:  1-4 = Minimal depression, 5-9 = Mild depression, 10-14 = Moderate depression, 15-19 = Moderately severe depression, 20-27 = Severe depression   Psychosocial Evaluation and Intervention:  Psychosocial Evaluation - 04/07/24 1320       Psychosocial Evaluation & Interventions   Interventions Encouraged to exercise with the program and follow exercise prescription;Stress management education;Relaxation education    Comments Ms. Malu is coming to pulmonary rehab with COPD. She states her breathing difficulty depends on the day, but she uses her oxygen  consistently. She mentions her anxiety kicks in when she gets real short of breath and xanax   helps with that. Her stress is related to her disease process. She states she  has a good support system and is looking forward to the program    Expected Outcomes Short: attend pulmonary rehab for education and exercise Long: develop and maintain positive self care habits    Continue Psychosocial Services  Follow up required by staff          Psychosocial Re-Evaluation:   Psychosocial Discharge (Final Psychosocial Re-Evaluation):   Education: Education Goals: Education classes will be provided on a weekly basis, covering required topics. Participant will state understanding/return demonstration of topics presented.  Learning Barriers/Preferences:  Learning Barriers/Preferences - 04/07/24 1310       Learning Barriers/Preferences   Learning Barriers None    Learning Preferences Individual Instruction          General Pulmonary Education Topics:  Infection Prevention: - Provides verbal and written material to individual with discussion of infection control including proper hand washing and proper equipment cleaning during exercise session. Flowsheet Row Pulmonary Rehab from 04/13/2024 in [facility] Cardiac and Pulmonary Rehab  Date 04/13/24  Educator MB  Instruction Review Code 1- Verbalizes Understanding    Falls Prevention: - Provides verbal and written material to individual with discussion of falls prevention and safety. Flowsheet Row Pulmonary Rehab from 04/13/2024 in [facility] Cardiac and Pulmonary Rehab  Date 04/13/24  Educator MB  Instruction Review Code 1- Verbalizes Understanding    Chronic Lung Disease Review: - Group verbal instruction with posters, models, PowerPoint presentations and videos,  to review new updates, new respiratory medications, new advancements in procedures and treatments. Providing information on websites and 800 numbers for continued self-education. Includes information about supplement oxygen , available portable oxygen  systems, continuous and intermittent flow rates, oxygen  safety, concentrators, and Medicare reimbursement  for oxygen . Explanation of Pulmonary Drugs, including class, frequency, complications, importance of spacers, rinsing mouth after steroid MDI's, and proper cleaning methods for nebulizers. Review of basic lung anatomy and physiology related to function, structure, and complications of lung disease. Review of risk factors. Discussion about methods for diagnosing sleep apnea and types of masks and machines for OSA. Includes a review of the use of types of environmental controls: home humidity, furnaces, filters, dust mite/pet prevention, HEPA vacuums. Discussion about weather changes, air quality and the benefits of nasal washing. Instruction on Warning signs, infection symptoms, calling MD promptly, preventive modes, and value of vaccinations. Review of effective airway clearance, coughing and/or vibration techniques. Emphasizing that all should Create an Action Plan. Written material provided at class time. Flowsheet Row Pulmonary Rehab from 04/13/2024 in [facility] Cardiac and Pulmonary Rehab  Education need identified 04/13/24    AED/CPR: - Group verbal and written instruction with the use of models to demonstrate the basic use of the AED with the basic ABC's of resuscitation.    Tests and Procedures:  - Group verbal and visual presentation and models provide information about basic cardiac anatomy and function. Reviews the testing methods done to diagnose heart disease and the outcomes of the test results. Describes the treatment choices: Medical Management, Angioplasty, or Coronary Bypass Surgery for treating various heart conditions including Myocardial Infarction, Angina, Valve Disease, and Cardiac Arrhythmias.  Written material provided at class time.   Medication Safety: - Group verbal and visual instruction to review commonly prescribed medications for heart and lung disease. Reviews the medication, class of the drug, and side effects. Includes the steps to properly store meds and maintain the  prescription regimen.  Written material given at graduation.  Other: -Provides group and verbal instruction on various topics (see comments)   Knowledge Questionnaire Score:  Knowledge Questionnaire Score - 04/13/24 1626       Knowledge Questionnaire Score   Pre Score 16/18           Core Components/Risk Factors/Patient Goals at Admission:  Personal Goals and Risk Factors at Admission - 04/13/24 1626       Core Components/Risk Factors/Patient Goals on Admission    Weight Management Yes;Weight Maintenance    Intervention Weight Management: Develop a combined nutrition and exercise program designed to reach desired caloric intake, while maintaining appropriate intake of nutrient and fiber, sodium and fats, and appropriate energy expenditure required for the weight goal.;Weight Management: Provide education and appropriate resources to help participant work on and attain dietary goals.;Weight Management/Obesity: Establish reasonable short term and long term weight goals.    Admit Weight 116 lb 3.2 oz (52.7 kg)    Goal Weight: Short Term 11 lb 3.2 oz (5.08 kg)    Goal Weight: Long Term 116 lb 3.2 oz (52.7 kg)    Expected Outcomes Short Term: Continue to assess and modify interventions until short term weight is achieved;Long Term: Adherence to nutrition and physical activity/exercise program aimed toward attainment of established weight goal;Weight Maintenance: Understanding of the daily nutrition guidelines, which includes 25-35% calories from fat, 7% or less cal from saturated fats, less than 200mg  cholesterol, less than 1.5gm of sodium, & 5 or more servings of fruits and vegetables daily;Understanding recommendations for meals to include 15-35% energy as protein, 25-35% energy from fat, 35-60% energy from carbohydrates, less than 200mg  of dietary cholesterol, 20-35 gm of total fiber daily;Understanding of distribution of calorie intake throughout the day with the consumption of 4-5  meals/snacks    Diabetes Yes    Intervention Provide education about signs/symptoms and action to take for hypo/hyperglycemia.;Provide education about proper nutrition, including hydration, and aerobic/resistive exercise prescription along with prescribed medications to achieve blood glucose in normal ranges: Fasting glucose 65-99 mg/dL    Expected Outcomes Long Term: Attainment of HbA1C < 7%.;Short Term: Participant verbalizes understanding of the signs/symptoms and immediate care of hyper/hypoglycemia, proper foot care and importance of medication, aerobic/resistive exercise and nutrition plan for blood glucose control.    Hypertension Yes    Intervention Provide education on lifestyle modifcations including regular physical activity/exercise, weight management, moderate sodium restriction and increased consumption of fresh fruit, vegetables, and low fat dairy, alcohol moderation, and smoking cessation.;Monitor prescription use compliance.    Expected Outcomes Short Term: Continued assessment and intervention until BP is < 140/57mm HG in hypertensive participants. < 130/24mm HG in hypertensive participants with diabetes, heart failure or chronic kidney disease.;Long Term: Maintenance of blood pressure at goal levels.          Education:Diabetes - Individual verbal and written instruction to review signs/symptoms of diabetes, desired ranges of glucose level fasting, after meals and with exercise. Acknowledge that pre and post exercise glucose checks will be done for 3 sessions at entry of program. Flowsheet Row Pulmonary Rehab from 04/13/2024 in [facility] Cardiac and Pulmonary Rehab  Date 04/13/24  Educator MB  Instruction Review Code 1- Verbalizes Understanding    Know Your Numbers and Heart Failure: - Group verbal and visual instruction to discuss disease risk factors for cardiac and pulmonary disease and treatment options.  Reviews associated critical values for Overweight/Obesity, Hypertension,  Cholesterol, and Diabetes.  Discusses basics of heart failure: signs/symptoms and treatments.  Introduces Heart Failure Zone chart for  action plan for heart failure. Written material provided at class time.   Core Components/Risk Factors/Patient Goals Review:    Core Components/Risk Factors/Patient Goals at Discharge (Final Review):    ITP Comments:  ITP Comments     Row Name 04/07/24 1318 04/13/24 1615         ITP Comments Initial phone call completed. Diagnosis can be found in [facility] 7/23. EP Orientation scheduled for Thursday 8/28 at 2pm. Completed and gym orientation for pulmonary rehab. Initial ITP created and sent for review to Dr. Faud Aleskerov, Medical Director.         Comments: Initial ITP

## 2024-04-13 NOTE — Patient Instructions (Signed)
 Patient Instructions  Patient Details  Name: Angela Jensen MRN: [id number] Date of Birth: [birth year] Referring Provider:  Parris Manna, MD  Below are your personal goals for exercise, nutrition, and risk factors. Our goal is to help you stay on track towards obtaining and maintaining these goals. We will be discussing your progress on these goals with you throughout the program.  Initial Exercise Prescription:  Initial Exercise Prescription - 04/13/24 1600       Date of Initial Exercise RX and Referring Provider   Date 04/13/24    Referring Provider Parris Manna, MD      Oxygen    Oxygen  Continuous    Liters 4    Maintain Oxygen  Saturation 88% or higher      Recumbant Bike   Level 1    RPM 50    Watts 15    Minutes 15    METs 1.97      NuStep   Level 1    SPM 80    Minutes 15    METs 1.97      Track   Laps 17    Minutes 15    METs 1.92      Prescription Details   Frequency (times per week) 2    Duration Progress to 30 minutes of continuous aerobic without signs/symptoms of physical distress      Intensity   THRR 40-80% of Max Heartrate 107-137    Ratings of Perceived Exertion 11-13    Perceived Dyspnea 0-4      Progression   Progression Continue to progress workloads to maintain intensity without signs/symptoms of physical distress.      Resistance Training   Training Prescription Yes    Weight 3 lb    Reps 10-15          Exercise Goals: Frequency: Be able to perform aerobic exercise two to three times per week in program working toward 2-5 days per week of home exercise.  Intensity: Work with a perceived exertion of 11 (fairly light) - 15 (hard) while following your exercise prescription.  We will make changes to your prescription with you as you progress through the program.   Duration: Be able to do 30 to 45 minutes of continuous aerobic exercise in addition to a 5 minute warm-up and a 5 minute cool-down routine.   Nutrition Goals: Your  personal nutrition goals will be established when you do your nutrition analysis with the dietician.  The following are general nutrition guidelines to follow: Cholesterol < 200mg /day Sodium < 1500mg /day Fiber: Women over 50 yrs - 21 grams per day  Personal Goals:  Personal Goals and Risk Factors at Admission - 04/13/24 1626       Core Components/Risk Factors/Patient Goals on Admission    Weight Management Yes;Weight Maintenance    Intervention Weight Management: Develop a combined nutrition and exercise program designed to reach desired caloric intake, while maintaining appropriate intake of nutrient and fiber, sodium and fats, and appropriate energy expenditure required for the weight goal.;Weight Management: Provide education and appropriate resources to help participant work on and attain dietary goals.;Weight Management/Obesity: Establish reasonable short term and long term weight goals.    Admit Weight 116 lb 3.2 oz (52.7 kg)    Goal Weight: Short Term 11 lb 3.2 oz (5.08 kg)    Goal Weight: Long Term 116 lb 3.2 oz (52.7 kg)    Expected Outcomes Short Term: Continue to assess and modify interventions until short term weight is achieved;Long Term:  Adherence to nutrition and physical activity/exercise program aimed toward attainment of established weight goal;Weight Maintenance: Understanding of the daily nutrition guidelines, which includes 25-35% calories from fat, 7% or less cal from saturated fats, less than 200mg  cholesterol, less than 1.5gm of sodium, & 5 or more servings of fruits and vegetables daily;Understanding recommendations for meals to include 15-35% energy as protein, 25-35% energy from fat, 35-60% energy from carbohydrates, less than 200mg  of dietary cholesterol, 20-35 gm of total fiber daily;Understanding of distribution of calorie intake throughout the day with the consumption of 4-5 meals/snacks    Diabetes Yes    Intervention Provide education about signs/symptoms and  action to take for hypo/hyperglycemia.;Provide education about proper nutrition, including hydration, and aerobic/resistive exercise prescription along with prescribed medications to achieve blood glucose in normal ranges: Fasting glucose 65-99 mg/dL    Expected Outcomes Long Term: Attainment of HbA1C < 7%.;Short Term: Participant verbalizes understanding of the signs/symptoms and immediate care of hyper/hypoglycemia, proper foot care and importance of medication, aerobic/resistive exercise and nutrition plan for blood glucose control.    Hypertension Yes    Intervention Provide education on lifestyle modifcations including regular physical activity/exercise, weight management, moderate sodium restriction and increased consumption of fresh fruit, vegetables, and low fat dairy, alcohol moderation, and smoking cessation.;Monitor prescription use compliance.    Expected Outcomes Short Term: Continued assessment and intervention until BP is < 140/29mm HG in hypertensive participants. < 130/24mm HG in hypertensive participants with diabetes, heart failure or chronic kidney disease.;Long Term: Maintenance of blood pressure at goal levels.          Tobacco Use Initial Evaluation: Social History   Tobacco Use  Smoking Status Former   Current packs/day: 0.00   Average packs/day: 1 pack/day for 37.0 years (37.0 ttl pk-yrs)   Types: Cigarettes   Start date: 02/06/1973   Quit date: 02/06/2010   Years since quitting: 14.1  Smokeless Tobacco Former   Types: Snuff    Exercise Goals and Review:  Exercise Goals     Row Name 04/13/24 1623             Exercise Goals   Increase Physical Activity Yes       Intervention Provide advice, education, support and counseling about physical activity/exercise needs.;Develop an individualized exercise prescription for aerobic and resistive training based on initial evaluation findings, risk stratification, comorbidities and participant's personal goals.        Expected Outcomes Short Term: Attend rehab on a regular basis to increase amount of physical activity.;Long Term: Add in home exercise to make exercise part of routine and to increase amount of physical activity.;Long Term: Exercising regularly at least 3-5 days a week.       Increase Strength and Stamina Yes       Intervention Provide advice, education, support and counseling about physical activity/exercise needs.;Develop an individualized exercise prescription for aerobic and resistive training based on initial evaluation findings, risk stratification, comorbidities and participant's personal goals.       Expected Outcomes Short Term: Increase workloads from initial exercise prescription for resistance, speed, and METs.;Short Term: Perform resistance training exercises routinely during rehab and add in resistance training at home;Long Term: Improve cardiorespiratory fitness, muscular endurance and strength as measured by increased METs and functional capacity ( )       Able to understand and use rate of perceived exertion (RPE) scale Yes       Intervention Provide education and explanation on how to use RPE scale  Expected Outcomes Short Term: Able to use RPE daily in rehab to express subjective intensity level;Long Term:  Able to use RPE to guide intensity level when exercising independently       Able to understand and use Dyspnea scale Yes       Intervention Provide education and explanation on how to use Dyspnea scale       Expected Outcomes Long Term: Able to use Dyspnea scale to guide intensity level when exercising independently;Short Term: Able to use Dyspnea scale daily in rehab to express subjective sense of shortness of breath during exertion       Knowledge and understanding of Target Heart Rate Range (THRR) Yes       Intervention Provide education and explanation of THRR including how the numbers were predicted and where they are located for reference       Expected Outcomes Short  Term: Able to state/look up THRR;Short Term: Able to use daily as guideline for intensity in rehab;Long Term: Able to use THRR to govern intensity when exercising independently       Able to check pulse independently Yes       Intervention Provide education and demonstration on how to check pulse in carotid and radial arteries.;Review the importance of being able to check your own pulse for safety during independent exercise       Expected Outcomes Short Term: Able to explain why pulse checking is important during independent exercise;Long Term: Able to check pulse independently and accurately       Understanding of Exercise Prescription Yes       Intervention Provide education, explanation, and written materials on patient's individual exercise prescription       Expected Outcomes Short Term: Able to explain program exercise prescription;Long Term: Able to explain home exercise prescription to exercise independently

## 2024-04-13 NOTE — Transitions of Care (Post Inpatient/ED Visit) (Signed)
   04/13/2024  Name: WYATT GALVAN MRN: [id number] DOB: 1956/06/16  Today's TOC FU Call Status: Today's TOC FU Call Status:: Unsuccessful Call (1st Attempt) Unsuccessful Call (1st Attempt) Date: 04/13/24  Attempted to reach the patient regarding the most recent Inpatient/ED visit.  Patient readmitted 04/09/24 in observation status but was then changed to inpatient and discharged 04/12/24.  On telephonic TOC RN CM outreach a recording stating voice mail has not been set up is received and unable to leave a call back number or message.   Follow Up Plan: Additional outreach attempts will be made to reach the patient to complete the Transitions of Care (Post Inpatient/ED visit) call.    Bing Edison MSN, RN RN Case Sales executive Health  [facility]Population Health Office Hours M-F [phone] Direct Dial : [phone] Main Phone [phone]  Fax: [phone] [email or web address]

## 2024-04-14 ENCOUNTER — Telehealth: Payer: Self-pay

## 2024-04-14 NOTE — Transitions of Care (Post Inpatient/ED Visit) (Signed)
   04/14/2024  Name: Angela Jensen MRN: [id number] DOB: June 16, 1956  Today's TOC FU Call Status: Today's TOC FU Call Status:: Unsuccessful Call (2nd Attempt) Unsuccessful Call (2nd Attempt) Date: 04/14/24  Attempted to reach the patient regarding the most recent Inpatient/ED visit.  Follow Up Plan: Additional outreach attempts will be made to reach the patient to complete the Transitions of Care (Post Inpatient/ED visit) call.   Arvin Seip RN, BSN, CCM [employer], Population Health Case Manager Phone: [phone]

## 2024-04-15 ENCOUNTER — Other Ambulatory Visit: Payer: Self-pay | Admitting: Nurse Practitioner

## 2024-04-15 DIAGNOSIS — Z09 Encounter for follow-up examination after completed treatment for conditions other than malignant neoplasm: Secondary | ICD-10-CM

## 2024-04-15 DIAGNOSIS — J449 Chronic obstructive pulmonary disease, unspecified: Secondary | ICD-10-CM

## 2024-04-18 ENCOUNTER — Other Ambulatory Visit: Payer: Self-pay

## 2024-04-18 ENCOUNTER — Inpatient Hospital Stay: Admitting: Nurse Practitioner

## 2024-04-18 ENCOUNTER — Other Ambulatory Visit

## 2024-04-18 ENCOUNTER — Ambulatory Visit: Payer: 59

## 2024-04-18 ENCOUNTER — Emergency Department

## 2024-04-18 ENCOUNTER — Ambulatory Visit: Payer: 59 | Admitting: Oncology

## 2024-04-18 ENCOUNTER — Inpatient Hospital Stay
Admission: EM | Admit: 2024-04-18 | Discharge: 2024-04-21 | DRG: 291 | Disposition: A | Discharge status: Home / Self Care | Attending: Osteopathic Medicine | Admitting: Osteopathic Medicine

## 2024-04-18 DIAGNOSIS — J432 Centrilobular emphysema: Secondary | ICD-10-CM | POA: Diagnosis not present

## 2024-04-18 DIAGNOSIS — Z9981 Dependence on supplemental oxygen: Secondary | ICD-10-CM

## 2024-04-18 DIAGNOSIS — J441 Chronic obstructive pulmonary disease with (acute) exacerbation: Principal | ICD-10-CM | POA: Diagnosis present

## 2024-04-18 DIAGNOSIS — E1151 Type 2 diabetes mellitus with diabetic peripheral angiopathy without gangrene: Secondary | ICD-10-CM | POA: Diagnosis present

## 2024-04-18 DIAGNOSIS — J9601 Acute respiratory failure with hypoxia: Secondary | ICD-10-CM | POA: Diagnosis not present

## 2024-04-18 DIAGNOSIS — Z1152 Encounter for screening for COVID-19: Secondary | ICD-10-CM

## 2024-04-18 DIAGNOSIS — R0602 Shortness of breath: Secondary | ICD-10-CM | POA: Diagnosis not present

## 2024-04-18 DIAGNOSIS — Z85118 Personal history of other malignant neoplasm of bronchus and lung: Secondary | ICD-10-CM

## 2024-04-18 DIAGNOSIS — J439 Emphysema, unspecified: Secondary | ICD-10-CM | POA: Diagnosis not present

## 2024-04-18 DIAGNOSIS — I5033 Acute on chronic diastolic (congestive) heart failure: Secondary | ICD-10-CM | POA: Diagnosis not present

## 2024-04-18 DIAGNOSIS — Z7951 Long term (current) use of inhaled steroids: Secondary | ICD-10-CM | POA: Diagnosis not present

## 2024-04-18 DIAGNOSIS — E782 Mixed hyperlipidemia: Secondary | ICD-10-CM | POA: Diagnosis present

## 2024-04-18 DIAGNOSIS — F32A Depression, unspecified: Secondary | ICD-10-CM | POA: Diagnosis present

## 2024-04-18 DIAGNOSIS — Z923 Personal history of irradiation: Secondary | ICD-10-CM

## 2024-04-18 DIAGNOSIS — Z833 Family history of diabetes mellitus: Secondary | ICD-10-CM

## 2024-04-18 DIAGNOSIS — Z8249 Family history of ischemic heart disease and other diseases of the circulatory system: Secondary | ICD-10-CM | POA: Diagnosis not present

## 2024-04-18 DIAGNOSIS — Z7982 Long term (current) use of aspirin: Secondary | ICD-10-CM

## 2024-04-18 DIAGNOSIS — I5042 Chronic combined systolic (congestive) and diastolic (congestive) heart failure: Secondary | ICD-10-CM | POA: Diagnosis present

## 2024-04-18 DIAGNOSIS — J9612 Chronic respiratory failure with hypercapnia: Secondary | ICD-10-CM | POA: Diagnosis not present

## 2024-04-18 DIAGNOSIS — R069 Unspecified abnormalities of breathing: Secondary | ICD-10-CM | POA: Diagnosis not present

## 2024-04-18 DIAGNOSIS — Z91041 Radiographic dye allergy status: Secondary | ICD-10-CM

## 2024-04-18 DIAGNOSIS — I110 Hypertensive heart disease with heart failure: Principal | ICD-10-CM | POA: Diagnosis present

## 2024-04-18 DIAGNOSIS — Z7902 Long term (current) use of antithrombotics/antiplatelets: Secondary | ICD-10-CM | POA: Diagnosis not present

## 2024-04-18 DIAGNOSIS — Z83438 Family history of other disorder of lipoprotein metabolism and other lipidemia: Secondary | ICD-10-CM

## 2024-04-18 DIAGNOSIS — Z7984 Long term (current) use of oral hypoglycemic drugs: Secondary | ICD-10-CM | POA: Diagnosis not present

## 2024-04-18 DIAGNOSIS — Z743 Need for continuous supervision: Secondary | ICD-10-CM | POA: Diagnosis not present

## 2024-04-18 DIAGNOSIS — J9621 Acute and chronic respiratory failure with hypoxia: Secondary | ICD-10-CM | POA: Diagnosis not present

## 2024-04-18 DIAGNOSIS — Z794 Long term (current) use of insulin: Secondary | ICD-10-CM

## 2024-04-18 DIAGNOSIS — J9622 Acute and chronic respiratory failure with hypercapnia: Secondary | ICD-10-CM | POA: Diagnosis present

## 2024-04-18 DIAGNOSIS — Z87891 Personal history of nicotine dependence: Secondary | ICD-10-CM

## 2024-04-18 DIAGNOSIS — Z950 Presence of cardiac pacemaker: Secondary | ICD-10-CM

## 2024-04-18 DIAGNOSIS — Z79899 Other long term (current) drug therapy: Secondary | ICD-10-CM

## 2024-04-18 DIAGNOSIS — D472 Monoclonal gammopathy: Secondary | ICD-10-CM | POA: Diagnosis present

## 2024-04-18 DIAGNOSIS — Z888 Allergy status to other drugs, medicaments and biological substances status: Secondary | ICD-10-CM

## 2024-04-18 DIAGNOSIS — I2510 Atherosclerotic heart disease of native coronary artery without angina pectoris: Secondary | ICD-10-CM | POA: Diagnosis not present

## 2024-04-18 DIAGNOSIS — J9602 Acute respiratory failure with hypercapnia: Secondary | ICD-10-CM | POA: Diagnosis not present

## 2024-04-18 DIAGNOSIS — Z6836 Body mass index (BMI) 36.0-36.9, adult: Secondary | ICD-10-CM

## 2024-04-18 DIAGNOSIS — R918 Other nonspecific abnormal finding of lung field: Secondary | ICD-10-CM | POA: Diagnosis not present

## 2024-04-18 DIAGNOSIS — I10 Essential (primary) hypertension: Secondary | ICD-10-CM | POA: Diagnosis present

## 2024-04-18 DIAGNOSIS — E66812 Obesity, class 2: Secondary | ICD-10-CM | POA: Diagnosis present

## 2024-04-18 DIAGNOSIS — J9611 Chronic respiratory failure with hypoxia: Secondary | ICD-10-CM | POA: Diagnosis present

## 2024-04-18 DIAGNOSIS — E119 Type 2 diabetes mellitus without complications: Secondary | ICD-10-CM

## 2024-04-18 DIAGNOSIS — F419 Anxiety disorder, unspecified: Secondary | ICD-10-CM | POA: Diagnosis present

## 2024-04-18 DIAGNOSIS — Z801 Family history of malignant neoplasm of trachea, bronchus and lung: Secondary | ICD-10-CM

## 2024-04-18 LAB — COMPREHENSIVE METABOLIC PANEL WITH GFR
ALT: 38 U/L (ref 0–44)
AST: 45 U/L — ABNORMAL HIGH (ref 15–41)
Albumin: 3.5 g/dL (ref 3.5–5.0)
Alkaline Phosphatase: 68 U/L (ref 38–126)
Anion gap: 9 (ref 5–15)
BUN: 16 mg/dL (ref 8–23)
CO2: 31 mmol/L (ref 22–32)
Calcium: 9.4 mg/dL (ref 8.9–10.3)
Chloride: 101 mmol/L (ref 98–111)
Creatinine, Ser: 0.84 mg/dL (ref 0.44–1.00)
GFR, Estimated: >60 mL/min (ref >60)
Glucose, Bld: 190 mg/dL — ABNORMAL HIGH (ref 70–99)
Potassium: 4.3 mmol/L (ref 3.5–5.1)
Sodium: 141 mmol/L (ref 135–145)
Total Bilirubin: 0.6 mg/dL (ref 0.0–1.2)
Total Protein: 6.6 g/dL (ref 6.5–8.1)

## 2024-04-18 LAB — RESP PANEL BY RT-PCR (RSV, FLU A&B, COVID)  RVPGX2
Influenza A by PCR: NEGATIVE
Influenza B by PCR: NEGATIVE
Resp Syncytial Virus by PCR: NEGATIVE
SARS Coronavirus 2 by RT PCR: NEGATIVE

## 2024-04-18 LAB — BLOOD GAS, VENOUS
Acid-Base Excess: 11.9 mmol/L — ABNORMAL HIGH (ref 0.0–2.0)
Bicarbonate: 38 mmol/L — ABNORMAL HIGH (ref 20.0–28.0)
O2 Saturation: 88.7 %
Patient temperature: 37
Patient temperature: 37
pCO2, Ven: 56 mmHg (ref 44–60)
pH, Ven: 7.44 — ABNORMAL HIGH (ref 7.25–7.43)
pO2, Ven: 56 mmHg — ABNORMAL HIGH (ref 32–45)

## 2024-04-18 LAB — CBC WITH DIFFERENTIAL/PLATELET
Abs Immature Granulocytes: 0.06 K/uL (ref 0.00–0.07)
Basophils Absolute: 0 K/uL (ref 0.0–0.1)
Basophils Relative: 0 %
Eosinophils Absolute: 0.1 K/uL (ref 0.0–0.5)
Eosinophils Relative: 1 %
HCT: 36.5 % (ref 36.0–46.0)
Hemoglobin: 11 g/dL — ABNORMAL LOW (ref 12.0–15.0)
Immature Granulocytes: 1 %
Lymphocytes Relative: 21 %
Lymphs Abs: 2 K/uL (ref 0.7–4.0)
MCH: 28.6 pg (ref 26.0–34.0)
MCHC: 30.1 g/dL (ref 30.0–36.0)
MCV: 94.8 fL (ref 80.0–100.0)
Monocytes Absolute: 1 K/uL (ref 0.1–1.0)
Monocytes Relative: 10 %
Neutro Abs: 6.5 K/uL (ref 1.7–7.7)
Neutrophils Relative %: 67 %
Platelets: 195 K/uL (ref 150–400)
RBC: 3.85 MIL/uL — ABNORMAL LOW (ref 3.87–5.11)
RDW: 14.2 % (ref 11.5–15.5)
WBC: 9.6 K/uL (ref 4.0–10.5)
nRBC: 0 % (ref 0.0–0.2)

## 2024-04-18 LAB — TROPONIN I (HIGH SENSITIVITY)
Troponin I (High Sensitivity): 20 ng/L — ABNORMAL HIGH (ref <18)
Troponin I (High Sensitivity): 25 ng/L — ABNORMAL HIGH (ref <18)

## 2024-04-18 LAB — GLUCOSE, CAPILLARY
Glucose-Capillary: 304 mg/dL — ABNORMAL HIGH (ref 70–99)
Glucose-Capillary: 203 mg/dL — ABNORMAL HIGH (ref 70–99)

## 2024-04-18 LAB — BRAIN NATRIURETIC PEPTIDE: B Natriuretic Peptide: 146.8 pg/mL — ABNORMAL HIGH (ref 0.0–100.0)

## 2024-04-18 MED ORDER — FUROSEMIDE 10 MG/ML IJ SOLN
40.0000 mg | Freq: Once | INTRAMUSCULAR | Status: AC
  Administered 2024-04-18: 40 mg via INTRAVENOUS
  Filled 2024-04-18: qty 4

## 2024-04-18 MED ORDER — ENOXAPARIN SODIUM 40 MG/0.4ML IJ SOSY
40.0000 mg | PREFILLED_SYRINGE | INTRAMUSCULAR | Status: DC
  Administered 2024-04-18 – 2024-04-21 (×4): 40 mg via SUBCUTANEOUS
  Filled 2024-04-18 (×4): qty 0.4

## 2024-04-18 MED ORDER — OXYCODONE-ACETAMINOPHEN 5-325 MG PO TABS
1.0000 | ORAL_TABLET | Freq: Four times a day (QID) | ORAL | Status: DC | PRN
  Administered 2024-04-18 – 2024-04-21 (×7): 1 via ORAL
  Filled 2024-04-18 (×8): qty 1

## 2024-04-18 MED ORDER — INSULIN ASPART 100 UNIT/ML IJ SOLN
0.0000 [IU] | Freq: Three times a day (TID) | INTRAMUSCULAR | Status: DC
  Administered 2024-04-18: 7 [IU] via SUBCUTANEOUS
  Administered 2024-04-19 (×2): 5 [IU] via SUBCUTANEOUS
  Administered 2024-04-19: 3 [IU] via SUBCUTANEOUS
  Administered 2024-04-20 – 2024-04-21 (×3): 5 [IU] via SUBCUTANEOUS
  Administered 2024-04-21: 2 [IU] via SUBCUTANEOUS
  Filled 2024-04-18 (×8): qty 1

## 2024-04-18 MED ORDER — METHYLPREDNISOLONE SODIUM SUCC 125 MG IJ SOLR
80.0000 mg | Freq: Two times a day (BID) | INTRAMUSCULAR | Status: DC
  Administered 2024-04-18 – 2024-04-19 (×3): 80 mg via INTRAVENOUS
  Filled 2024-04-18 (×3): qty 2

## 2024-04-18 MED ORDER — CLOPIDOGREL BISULFATE 75 MG PO TABS
75.0000 mg | ORAL_TABLET | Freq: Every day | ORAL | Status: DC
  Administered 2024-04-19 – 2024-04-21 (×3): 75 mg via ORAL
  Filled 2024-04-18 (×3): qty 1

## 2024-04-18 MED ORDER — ACETAMINOPHEN 325 MG PO TABS
650.0000 mg | ORAL_TABLET | Freq: Four times a day (QID) | ORAL | Status: DC | PRN
Start: 2024-04-18 — End: 2024-04-21
  Administered 2024-04-19: 650 mg via ORAL
  Filled 2024-04-18: qty 2

## 2024-04-18 MED ORDER — INSULIN ASPART 100 UNIT/ML IJ SOLN: 0.0000 [IU] | Freq: Three times a day (TID) | INTRAMUSCULAR | Status: DC

## 2024-04-18 MED ORDER — IPRATROPIUM-ALBUTEROL 0.5-2.5 (3) MG/3ML IN SOLN
6.0000 mL | Freq: Once | RESPIRATORY_TRACT | Status: AC
  Administered 2024-04-18: 6 mL via RESPIRATORY_TRACT
  Filled 2024-04-18: qty 6

## 2024-04-18 MED ORDER — ACETAMINOPHEN 650 MG RE SUPP
650.0000 mg | Freq: Four times a day (QID) | RECTAL | Status: DC | PRN
Start: 2024-04-18 — End: 2024-04-21

## 2024-04-18 MED ORDER — ONDANSETRON HCL 4 MG PO TABS: 4.0000 mg | ORAL_TABLET | Freq: Four times a day (QID) | ORAL | Status: DC | PRN

## 2024-04-18 MED ORDER — ATORVASTATIN CALCIUM 10 MG PO TABS
10.0000 mg | ORAL_TABLET | Freq: Every day | ORAL | Status: DC
Start: 2024-04-18 — End: 2024-04-21
  Administered 2024-04-19 – 2024-04-21 (×3): 10 mg via ORAL
  Filled 2024-04-18 (×3): qty 1

## 2024-04-18 MED ORDER — METOPROLOL SUCCINATE ER 50 MG PO TB24
50.0000 mg | ORAL_TABLET | Freq: Every day | ORAL | Status: DC
  Administered 2024-04-19: 50 mg via ORAL
  Filled 2024-04-18: qty 1

## 2024-04-18 MED ORDER — POLYETHYLENE GLYCOL 3350 17 G PO PACK: 17.0000 g | PACK | Freq: Every day | ORAL | Status: DC | PRN

## 2024-04-18 MED ORDER — METHYLPREDNISOLONE SODIUM SUCC 40 MG IJ SOLR: 40.0000 mg | Freq: Four times a day (QID) | INTRAMUSCULAR | Status: DC

## 2024-04-18 MED ORDER — ONDANSETRON HCL 4 MG/2ML IJ SOLN: 4.0000 mg | Freq: Four times a day (QID) | INTRAMUSCULAR | Status: DC | PRN

## 2024-04-18 MED ORDER — ALPRAZOLAM 0.5 MG PO TABS
0.5000 mg | ORAL_TABLET | Freq: Two times a day (BID) | ORAL | Status: DC | PRN
  Administered 2024-04-18 – 2024-04-21 (×6): 0.5 mg via ORAL
  Filled 2024-04-18 (×6): qty 1

## 2024-04-18 MED ORDER — INSULIN ASPART 100 UNIT/ML IJ SOLN
0.0000 [IU] | Freq: Every day | INTRAMUSCULAR | Status: DC
  Administered 2024-04-18: 2 [IU] via SUBCUTANEOUS
  Administered 2024-04-20: 5 [IU] via SUBCUTANEOUS
  Filled 2024-04-18 (×3): qty 1

## 2024-04-18 MED ORDER — ASPIRIN 81 MG PO TBEC
81.0000 mg | DELAYED_RELEASE_TABLET | Freq: Every day | ORAL | Status: DC
  Administered 2024-04-19 – 2024-04-21 (×3): 81 mg via ORAL
  Filled 2024-04-18 (×3): qty 1

## 2024-04-18 MED ORDER — PANTOPRAZOLE SODIUM 40 MG PO TBEC
40.0000 mg | DELAYED_RELEASE_TABLET | Freq: Every day | ORAL | Status: DC
  Administered 2024-04-19 – 2024-04-21 (×3): 40 mg via ORAL
  Filled 2024-04-18 (×4): qty 1

## 2024-04-18 MED ORDER — IPRATROPIUM-ALBUTEROL 0.5-2.5 (3) MG/3ML IN SOLN: 3.0000 mL | Freq: Four times a day (QID) | RESPIRATORY_TRACT | Status: AC | PRN

## 2024-04-18 MED ORDER — FUROSEMIDE 10 MG/ML IJ SOLN
20.0000 mg | Freq: Two times a day (BID) | INTRAMUSCULAR | Status: AC
  Administered 2024-04-18 – 2024-04-19 (×4): 20 mg via INTRAVENOUS
  Filled 2024-04-18 (×2): qty 2
  Filled 2024-04-18: qty 4
  Filled 2024-04-18: qty 2

## 2024-04-18 NOTE — ED Provider Notes (Signed)
 7:02 AM Assumed care for off going team.   Blood pressure (!) 178/112, pulse 79, resp. rate 20, height 4' 11 (1.499 m), weight 53.1 kg, SpO2 95%.  See their HPI for full report but in brief pending labs   I reviewed the x-ray personally interpreted and no obvious focal pneumonia but maybe a little bit of pleural fluid on the right  At this time I will hold antibiotics as she is afebrile normal  white count  VBG is concerning with hypercapnia with low pH patient will need to be initiated on BiPAP.  Chest x-ray concerning for possible pleural fluid.  Patient will need some diuresis.  7:08 AM reevaluated patient and she has slightly increased work of breathing and given VBG we discussed initiation of BiPAP.  Patient feels comfortable with this plan.  We discussed getting blood work and admitting patient to the hospital. NO swellign in legs so seems more likely COPD exac but given pt has some possible pleural fluid will start on IV lasix  as well.   Troponin is slightly elevated will need to continue to trend out she denied any active chest pain.  Her CMP is reassuring.  Discussed with hospital for admission.  .Critical Care  Performed by: Ernest Ronal BRAVO, MD Authorized by: Ernest Ronal BRAVO, MD   Critical care provider statement:    Critical care time (minutes):  30   Critical care was necessary to treat or prevent imminent or life-threatening deterioration of the following conditions:  Respiratory failure   Critical care was time spent personally by me on the following activities:  Development of treatment plan with patient or surrogate, discussions with consultants, evaluation of patient's response to treatment, examination of patient, ordering and review of laboratory studies, ordering and review of radiographic studies, ordering and performing treatments and interventions, pulse oximetry, re-evaluation of patient's condition and review of old charts     Ernest Ronal BRAVO, MD 04/18/24 [id number]

## 2024-04-18 NOTE — Progress Notes (Signed)
 PHARMACIST - PHYSICIAN COMMUNICATION   CONCERNING: Methylprednisolone  IV    Current order: Methylprednisolone  IV 40mg  every 6 hours x 2 days     DESCRIPTION: Per [facility] Protocol:   IV methylprednisolone  will be converted to either a q12h or q24h frequency with the same total daily dose (TDD).  Ordered Dose: 1 to 125 mg TDD; convert to: TDD q24h.  Ordered Dose: 126 to 250 mg TDD; convert to: TDD div q12h.  Ordered Dose: >250 mg TDD; DAW.  Order has been adjusted to: Methylprednisolone  IV 80mg  every 12 hours x 2 days    Angela Jensen , PharmD, BCPS Clinical Pharmacist  04/18/2024 8:15 AM

## 2024-04-18 NOTE — Evaluation (Signed)
 Physical Therapy Evaluation Patient Details Name: Angela Jensen MRN: [id number] DOB: 10-27-1955 Today's Date: 04/18/2024  History of Present Illness  68 y.o. female who was recently admitted to [facility] and was discharged on 04/12/2024 after being treated for COPD exacerbation came in today for complaining of progressive shortness of breath started few hours prior to coming to ED. Admitted for management of Acute on chronic hypoxemic and hypercarbic respiratory failure likely due to combination of COPD and CHF exacerbation. PMH of COPD on 4 L nasal cannula oxygen  at home, CHF s/p pacemaker, HTN, HLD, depression, sleep apnea  Clinical Impression  Patient admitted with the above. PTA, patient lives with her boyfriend and uses rollator PRN but mostly ambulates with no AD. Requires 3-4L O2 at all times at baseline. Patient presents with weakness, impaired balance, and decreased activity tolerance. On 3.5L O2 on arrival, however upon standing, spO2 dropped to 84% with 2 minute standing rest break and increase to 4.5L O2 to improve spO2 >88% prior to ambulation. Ambulated 20' with RW and supervision with spO2 decrease to 82% on 4.5L. Required 2 minute seated rest break to increase spO2 >88% prior to returning to bed. Educated patient on need for increasing amounts of O2 for mobility if she were to attempt to mobilize to bathroom with nursing staff, patient verbalized understanding. Patient will benefit from skilled PT services during acute stay to address listed deficits. Recommend cardiopulmonary rehab at discharge.         If plan is discharge home, recommend the following: Help with stairs or ramp for entrance   Can travel by private vehicle        Equipment Recommendations None recommended by PT  Recommendations for Other Services       Functional Status Assessment Patient has had a recent decline in their functional status and demonstrates the ability to make significant improvements in function in a  reasonable and predictable amount of time.     Precautions / Restrictions Precautions Precautions: Fall Recall of Precautions/Restrictions: Intact Restrictions Weight Bearing Restrictions Per Provider Order: No      Mobility  Bed Mobility Overal bed mobility: Modified Independent                  Transfers Overall transfer level: Needs assistance Equipment used: Rolling Letroy Vazguez (2 wheels) Transfers: Sit to/from Stand Sit to Stand: Supervision           General transfer comment: on 3.5L to stand, spO2 dropped to 84% with 2 minute standing rest break and increase to 4.5L to improve spO2 >88% prior to ambulating    Ambulation/Gait Ambulation/Gait assistance: Supervision Gait Distance (Feet): 20 Feet Assistive device: Rolling Lashonna Rieke (2 wheels) Gait Pattern/deviations: Step-through pattern Gait velocity: decreased     General Gait Details: ambulated on 4.5L O2 with spO2 dropping to 82%. Seated rest break x 2 minutes to increase spO2>88%  Stairs            Wheelchair Mobility     Tilt Bed    Modified Rankin (Stroke Patients Only)       Balance Overall balance assessment: Mild deficits observed, not formally tested                                           Pertinent Vitals/Pain Pain Assessment Pain Assessment: Faces Faces Pain Scale: Hurts little more Pain Location: abdomen Pain Descriptors / Indicators:  Discomfort Pain Intervention(s): Limited activity within patient's tolerance, Monitored during session    Home Living Family/patient expects to be discharged to:: Private residence Living Arrangements: Spouse/significant other Available Help at Discharge: Family Type of Home: House Home Access: Stairs to enter Entrance Stairs-Rails: Right Entrance Stairs-Number of Steps: 1   Home Layout: Laundry or work area in basement;Two level;Able to live on main level with bedroom/bathroom Home Equipment: Rollator (4 wheels);Cane -  single point;Shower seat Additional Comments: On 3-4L/min O2 at all times.    Prior Function Prior Level of Function : Independent/Modified Independent;Driving             Mobility Comments: PRN use of rollator, otherwise walks without any AD (primarily uses the rollator while cooking, so she can sit). Denies falls in the past 6 months ADLs Comments: Independent with bathing & dressing.     Extremity/Trunk Assessment   Upper Extremity Assessment Upper Extremity Assessment: Defer to OT evaluation    Lower Extremity Assessment Lower Extremity Assessment: Generalized weakness    Cervical / Trunk Assessment Cervical / Trunk Assessment: Normal  Communication   Communication Communication: No apparent difficulties    Cognition Arousal: Alert Behavior During Therapy: WFL for tasks assessed/performed   PT - Cognitive impairments: No apparent impairments                         Following commands: Intact       Cueing       General Comments      Exercises     Assessment/Plan    PT Assessment Patient needs continued PT services  PT Problem List Cardiopulmonary status limiting activity;Decreased activity tolerance;Decreased mobility       PT Treatment Interventions DME instruction;Gait training;Therapeutic activities;Functional mobility training;Therapeutic exercise;Balance training;Patient/family education    PT Goals (Current goals can be found in the Care Plan section)  Acute Rehab PT Goals Patient Stated Goal: return home PT Goal Formulation: With patient Time For Goal Achievement: 05/02/24 Potential to Achieve Goals: Good    Frequency Min 3X/week     Co-evaluation               AM-PAC PT 6 Clicks Mobility  Outcome Measure Help needed turning from your back to your side while in a flat bed without using bedrails?: None Help needed moving from lying on your back to sitting on the side of a flat bed without using bedrails?: None Help  needed moving to and from a bed to a chair (including a wheelchair)?: A Little Help needed standing up from a chair using your arms (e.g., wheelchair or bedside chair)?: A Little Help needed to walk in hospital room?: A Little Help needed climbing 3-5 steps with a railing? : A Little 6 Click Score: 20    End of Session Equipment Utilized During Treatment: Oxygen  Activity Tolerance: Patient tolerated treatment well Patient left: in bed;with call bell/phone within reach;with bed alarm set;with family/visitor present Nurse Communication: Mobility status PT Visit Diagnosis: Muscle weakness (generalized) (M62.81);Difficulty in walking, not elsewhere classified (R26.2)    Time: 8653-8596 PT Time Calculation (min) (ACUTE ONLY): 17 min   Charges:   PT Evaluation $PT Eval Moderate Complexity: 1 Mod   PT General Charges $$ ACUTE PT VISIT: 1 Visit         Maryanne Finder, PT, DPT Physical Therapist - [facility]  [facility]   Kemauri Musa A Alaynna Kerwood 04/18/2024, 2:10 PM

## 2024-04-18 NOTE — ED Provider Notes (Signed)
 [facility] Provider Note   Event Date/Time   First MD Initiated Contact with Patient 04/18/24 [phone]     (approximate) History  No chief complaint on file.  HPI Angela Jensen is a 68 y.o. female with a past medical history of ACS, CHF, COPD on 4 L chronically presents via EMS complaining of worsening shortness of breath for the last few hours.  EMS placed patient on nonrebreather with multiple DuoNebs and route as well as 125 Solu-Medrol .  Patient states that her symptoms have significantly improved since this treatment.  Patient denies any recent travel, sick contacts, or medication nonadherence ROS: Patient currently denies any vision changes, tinnitus, difficulty speaking, facial droop, sore throat, chest pain, abdominal pain, nausea/vomiting/diarrhea, dysuria, or weakness/numbness/paresthesias in any extremity   Physical Exam  Triage Vital Signs: ED Triage Vitals  Encounter Vitals Group     BP      Girls Systolic BP Percentile      Girls Diastolic BP Percentile      Boys Systolic BP Percentile      Boys Diastolic BP Percentile      Pulse      Resp      Temp      Temp src      SpO2      Weight      Height      Head Circumference      Peak Flow      Pain Score      Pain Loc      Pain Education      Exclude from Growth Chart    Most recent vital signs: There were no vitals filed for this visit. General: Awake, oriented x4. CV:  Good peripheral perfusion. Resp:  Normal effort.  Inspiratory and extra wheezing over bilateral lung fields Abd:  No distention. Other:  Elderly well-developed, well-nourished African-American female resting comfortably in no acute distress ED Results / Procedures / Treatments  Labs (all labs ordered are listed, but only abnormal results are displayed) Labs Reviewed - No data to display EKG ED ECG REPORT I, Artist MARLA Kerns, the attending physician, personally viewed and interpreted this ECG. Date: 04/18/2024 EKG Time:  0609 Rate: 82 Rhythm: normal sinus rhythm QRS Axis: normal Intervals: normal ST/T Wave abnormalities: normal Narrative Interpretation: no evidence of acute ischemia RADIOLOGY ED MD interpretation:  *** - All radiology independently interpreted and agree with radiology assessment Official radiology report(s): No results found. PROCEDURES: Critical Care performed: {CriticalCareYesNo:[id number]::Yes, see critical care procedure note(s),No} Procedures MEDICATIONS ORDERED IN ED: Medications - No data to display IMPRESSION / MDM / ASSESSMENT AND PLAN / ED COURSE  I reviewed the triage vital signs and the nursing notes.                             The patient is on the cardiac monitor to evaluate for evidence of arrhythmia and/or significant heart rate changes. Patient's presentation is most consistent with severe exacerbation of chronic illness. Patient is a 68 year old female with the above-stated past medical history presents via EMS with concerns for shortness of breath DDx: COPD exacerbation, CHF exacerbation, ACS, pneumothorax, pneumonia Plan: DuoNeb treatment CBC CMP Troponin BNP VBG EKG CXR   FINAL CLINICAL IMPRESSION(S) / ED DIAGNOSES   Final diagnoses:  None   Rx / DC Orders   ED Discharge Orders     None      Note:  This document  was prepared using Conservation officer, historic buildings and may include unintentional dictation errors.

## 2024-04-18 NOTE — H&P (Signed)
 History and Physical    Angela Jensen FMW:[id number] DOB: 1956/05/10 DOA: 04/18/2024  DOS: the patient was seen and examined on 04/18/2024  PCP: Liana Fish, NP   Patient coming from: Home  I have personally briefly reviewed patient's old medical records in [facility]  Chief Complaint: Shortness of breath  HPI: Angela Jensen is a pleasant 68 y.o. female with medical history significant for COPD on 4 L nasal cannula oxygen  at home, CHF s/p pacemaker, HTN, HLD, depression, sleep apnea who was recently admitted to [facility] and was discharged on 04/12/2024 after being treated for COPD exacerbation came in today for complaining of progressive shortness of breath started few hours prior to coming to ED.  Patient stated that she had difficulty breathing about an hour ago.  EMS reported that when fire department checked oxygen  it was 70%.  2 doses of albuterol  and oxygen  was given with saturation up to 95%, patient was placed on nonrebreather patient had decreased breath sounds, crackles and rhonchi.  Patient is able to talk and hold a conversation. Patient denied any fever, chills, nausea, vomiting, leg swelling.  She denied any chest pain, palpitations.  ED Course: Upon arrival to the ED, patient is found to be hypertensive at 178/112, hypoxemic requiring BiPAP, x-ray did not show obvious pneumonia but there was mild pleural effusion on the right side, VBG showed hypercapnia around 90 and low pH.  Patient was initiated on BiPAP, received 40 mg of IV Lasix  one-time, steroid, nebulization.  Hospitalist service was consulted for evaluation for admission for COPD exacerbation.  Review of Systems:  ROS  All other systems negative except as noted in the HPI.  Past Medical History:  Diagnosis Date   Anemia    Arthritis    Asthma    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/2016   Cancer (HCC) 2012   Right Lung CA   COPD (chronic obstructive  pulmonary disease) (HCC)    Depression    Diabetes mellitus without complication (HCC)    Patient takes Janumet   Essential hypertension 05/26/2016   Heart failure (HCC) 2022   Hydropneumothorax 05/03/2020   Hypercholesteremia    Oxygen  dependent    2L at nite    PAD (peripheral artery disease) (HCC) 06/22/2016   Peripheral vascular disease (HCC)    Personal history of radiation therapy    Shortness of breath dyspnea    with exertion    Sialolithiasis    Sleep apnea    Wears dentures    full upper and lower    Past Surgical History:  Procedure Laterality Date   APPLICATION OF CELL SAVER Left 01/21/2024   Procedure: APPLICATION OF CELL SAVER;  Surgeon: Marea Selinda RAMAN, MD;  Location: [facility];  Service: Vascular;  Laterality: Left;   CESAREAN SECTION     x3   COLONOSCOPY N/A 04/03/2024   Procedure: COLONOSCOPY;  Surgeon: Jinny Carmine, MD;  Location: [facility];  Service: Endoscopy;  Laterality: N/A;   COLONOSCOPY WITH PROPOFOL  N/A 06/25/2015   Procedure: COLONOSCOPY WITH PROPOFOL ;  Surgeon: Carmine Jinny, MD;  Location: [facility];  Service: Endoscopy;  Laterality: N/A;   COLONOSCOPY WITH PROPOFOL  N/A 07/26/2020   Procedure: COLONOSCOPY WITH PROPOFOL ;  Surgeon: Jinny Carmine, MD;  Location: [facility];  Service: Endoscopy;  Laterality: N/A;   CYST REMOVAL LEG     and on shoulder    ENDARTERECTOMY FEMORAL Left 01/21/2024   Procedure: LEFT COMMON, SUPERFICIAL FEMORAL AND PROFUNDA  FEMORIS ENDARTERECTOMY WITH BOVINE PERICARDIAL PATCH ANGIOPLASTY;  Surgeon: Marea Selinda RAMAN, MD;  Location: [facility];  Service: Vascular;  Laterality: Left;   ESOPHAGOGASTRODUODENOSCOPY (EGD) WITH PROPOFOL  N/A 07/26/2020   Procedure: ESOPHAGOGASTRODUODENOSCOPY (EGD) WITH PROPOFOL ;  Surgeon: Jinny Carmine, MD;  Location: [facility];  Service: Endoscopy;  Laterality: N/A;  Diabetic - oral meds   INSERTION OF ILIAC STENT Left 01/21/2024   Procedure: INSERTION, STENT, ARTERY, ILIAC;  Surgeon: Marea Selinda RAMAN, MD;  Location: [facility];  Service: Vascular;  Laterality: Left;  AND SFA STENTS   LOWER EXTREMITY ANGIOGRAPHY Left 09/29/2018   Procedure: LOWER EXTREMITY ANGIOGRAPHY;  Surgeon: Marea Selinda RAMAN, MD;  Location: [facility];  Service: Cardiovascular;  Laterality: Left;   LOWER EXTREMITY ANGIOGRAPHY Left 07/29/2023   Procedure: Lower Extremity Angiography;  Surgeon: Marea Selinda RAMAN, MD;  Location: [facility];  Service: Cardiovascular;  Laterality: Left;   LOWER EXTREMITY ANGIOGRAPHY Right 12/20/2023   Procedure: Lower Extremity Angiography;  Surgeon: Marea Selinda RAMAN, MD;  Location: [facility];  Service: Cardiovascular;  Laterality: Right;   LOWER EXTREMITY ANGIOGRAPHY Left 01/17/2024   Procedure: Lower Extremity Angiography;  Surgeon: Marea Selinda RAMAN, MD;  Location: [facility];  Service: Cardiovascular;  Laterality: Left;   LUNG BIOPSY  12/30/2011   has lung spots   PACEMAKER IMPLANT  07/14/2021   PACEMAKER LEADLESS INSERTION N/A 07/14/2021   Procedure: PACEMAKER LEADLESS INSERTION;  Surgeon: Ammon Blunt, MD;  Location: [facility];  Service: Cardiovascular;  Laterality: N/A;   PERIPHERAL VASCULAR CATHETERIZATION Left 06/01/2016   Procedure: Lower Extremity Angiography;  Surgeon: Selinda RAMAN Marea, MD;  Location: [facility];  Service: Cardiovascular;  Laterality: Left;   PERIPHERAL VASCULAR CATHETERIZATION N/A 06/01/2016   Procedure: Abdominal Aortogram w/Lower Extremity;  Surgeon: Selinda RAMAN Marea, MD;  Location: [facility];  Service: Cardiovascular;  Laterality: N/A;   PERIPHERAL VASCULAR CATHETERIZATION  06/01/2016   Procedure: Lower Extremity Intervention;  Surgeon: Selinda RAMAN Marea, MD;  Location: [facility];  Service: Cardiovascular;;   PERIPHERAL VASCULAR CATHETERIZATION Right 06/08/2016   Procedure: Lower Extremity Angiography;  Surgeon: Selinda RAMAN Marea, MD;  Location: [facility];  Service: Cardiovascular;   Laterality: Right;   PERIPHERAL VASCULAR CATHETERIZATION  06/08/2016   Procedure: Lower Extremity Intervention;  Surgeon: Selinda RAMAN Marea, MD;  Location: [facility];  Service: Cardiovascular;;   POLYPECTOMY  04/03/2024   Procedure: POLYPECTOMY, INTESTINE;  Surgeon: Jinny Carmine, MD;  Location: [facility];  Service: Endoscopy;;   SUBMANDIBULAR GLAND EXCISION Left 12/06/2020   Procedure: EXCISION SUBMANDIBULAR GLAND;  Surgeon: Herminio Miu, MD;  Location: [facility];  Service: ENT;  Laterality: Left;  needs to be first case Diabetic - diet controlled   TEMPORARY PACEMAKER N/A 07/11/2021   Procedure: TEMPORARY PACEMAKER;  Surgeon: Ammon Blunt, MD;  Location: [facility];  Service: Cardiovascular;  Laterality: N/A;     reports that she quit smoking about 14 years ago. Her smoking use included cigarettes. She started smoking about 51 years ago. She has a 37 pack-year smoking history. She has quit using smokeless tobacco.  Her smokeless tobacco use included snuff. She reports that she does not currently use alcohol after a past usage of about 5.0 standard drinks of alcohol per week. She reports that she does not currently use drugs after having used the following drugs: Marijuana, Crack cocaine, and Cocaine.  Allergies  Allergen Reactions  Iodinated Contrast Media Itching   Trelegy Ellipta  [Fluticasone -Umeclidin-Vilant]     Had breathing issues   Bactoshield Chg [Chlorhexidine  Gluconate] Itching and Other (See Comments)    Dry. Flaking, peeling skin    Family History  Problem Relation Age of Onset   Diabetes Mother    Hypercholesterolemia Mother    Lung cancer Father    Diabetes Sister    Diabetes Sister    Hypertension Sister    Diabetes Maternal Grandmother    Diabetes Paternal Grandmother    Heart attack Brother    Coronary artery disease Brother    Vascular Disease Brother    Heart attack Brother    Breast cancer Neg Hx     Prior to  Admission medications   Medication Sig Start Date End Date Taking? Authorizing Provider  albuterol  (PROVENTIL ) (5 MG/ML) 0.5% nebulizer solution Take 2.5 mg by nebulization every 6 (six) hours as needed for wheezing or shortness of breath.   Yes [provider]  albuterol  (VENTOLIN  HFA) 108 (90 Base) MCG/ACT inhaler INHALE 2 PUFFS BY MOUTH EVERY 6 HOURS AS NEEDED FOR WHEEZING OR SHORTNESS OF BREATH 10/13/23  Yes Abernathy, Alyssa, NP  ALPRAZolam  (XANAX ) 0.5 MG tablet Take 1 tablet (0.5 mg total) by mouth 2 (two) times daily as needed for anxiety. 04/05/24  Yes Abernathy, Mardy, NP  aspirin  EC 81 MG tablet Take 1 tablet (81 mg total) by mouth daily. Swallow whole. 07/29/23 07/28/24 Yes Dew, Selinda RAMAN, MD  atorvastatin  (LIPITOR) 10 MG tablet Take 1 tablet (10 mg total) by mouth daily. 07/29/23 07/28/24 Yes Dew, Selinda RAMAN, MD  calcium  carbonate (OSCAL) 1500 (600 Ca) MG TABS tablet Take 600 mg of elemental calcium  by mouth 2 (two) times daily with a meal.   Yes [provider]  clopidogrel  (PLAVIX ) 75 MG tablet Take 1 tablet (75 mg total) by mouth daily. 10/13/23  Yes Abernathy, Mardy, NP  cyclobenzaprine  (FLEXERIL ) 10 MG tablet Take 1 tablet (10 mg total) by mouth at bedtime. Take one tab po qhs for back spasm prn only Patient taking differently: Take 10 mg by mouth at bedtime as needed for muscle spasms. Take one tab po qhs for back spasm prn only 10/13/23  Yes Abernathy, Alyssa, NP  dexamethasone  (DECADRON ) 1 MG tablet Take 1 mg by mouth. 03/08/24 06/06/24 Yes [provider]  escitalopram  (LEXAPRO ) 10 MG tablet Take 1 tablet (10 mg total) by mouth daily. 08/12/23  Yes Abernathy, Mardy, NP  ferrous sulfate  325 (65 FE) MG tablet Take 1 tablet (325 mg total) by mouth daily with breakfast. 04/12/24  Yes Maree Hue, MD  furosemide  (LASIX ) 20 MG tablet Take 1 tablet (20 mg total) by mouth daily as needed. 04/27/23  Yes Abernathy, Alyssa, NP  insulin  glargine, 1 Unit Dial , (TOUJEO ) 300  UNIT/ML Solostar Pen Inject 10 Units into the skin daily. 07/27/23  Yes Abernathy, Alyssa, NP  ipratropium-albuterol  (DUONEB) 0.5-2.5 (3) MG/3ML SOLN Take 3 mLs by nebulization every 6 (six) hours as needed. 03/06/24  Yes Abernathy, Mardy, NP  losartan  (COZAAR ) 25 MG tablet Take 0.5 tablets (12.5 mg total) by mouth daily. 03/25/24 04/24/24 Yes Dezii, Alexandra, DO  metFORMIN  (GLUCOPHAGE -XR) 500 MG 24 hr tablet TAKE 2 TABLETS BY MOUTH ONCE DAILY WITH BREAKFAST 01/17/24  Yes Abernathy, Alyssa, NP  metoprolol  succinate (TOPROL -XL) 50 MG 24 hr tablet Take 1 tablet (50 mg total) by mouth daily. Take with or immediately following a meal. 03/01/24  Yes Abernathy, Alyssa, NP  mometasone -formoterol  (DULERA ) 200-5 MCG/ACT AERO  Inhale 2 puffs by mouth twice daily 07/18/23  Yes Khan, Fozia M, MD  oxyCODONE -acetaminophen  (PERCOCET/ROXICET) 5-325 MG tablet Take 1 tablet by mouth every 6 (six) hours as needed for severe pain (pain score 7-10). 03/20/24  Yes Abernathy, Alyssa, NP  pantoprazole  (PROTONIX ) 40 MG tablet Take 1 tablet (40 mg total) by mouth daily. 04/03/24 04/03/25 Yes Lenon Marien CROME, MD  potassium chloride  (KLOR-CON ) 10 MEQ tablet TAKE 1 TABLET BY MOUTH EVERY OTHER DAY 03/24/24  Yes Abernathy, Mardy, NP  pregabalin  (LYRICA ) 75 MG capsule Take 1 capsule (75 mg total) by mouth 2 (two) times daily. 03/06/24  Yes Brown, Fallon E, NP  SPIRIVA  HANDIHALER 18 MCG inhalation capsule INHALE THE CONTENTS OF 1 CAPSULES BY MOUTH ONCE DAILY - DO NOT SWALLOW CAPSULES 06/28/23  Yes McDonough, Lauren K, PA-C  sulfamethoxazole -trimethoprim  (BACTRIM ) 400-80 MG tablet Take 1 tablet by mouth 3 (three) times a week. Monday, Wednesday, Friday 03/10/24 04/18/24 Yes [provider]  vitamin B-12 (CYANOCOBALAMIN ) 100 MCG tablet Take 100 mcg by mouth daily.   Yes [provider]  VITAMIN D , CHOLECALCIFEROL , PO Take 1 tablet by mouth in the morning.   Yes [provider]  ACCU-CHEK GUIDE TEST test strip USE TO TEST  BLOOD SUGAR 5 TIMES DAILY 09/21/23   Liana Mardy, NP  Accu-Chek Softclix Lancets lancets Use 1 lancet 3 times daily to check glucose for diabetes 01/19/23   Liana Mardy, NP  Blood Glucose Monitoring Suppl (ACCU-CHEK GUIDE) w/Device KIT Use as directed Dx e11.65 04/11/21   Khan, Fozia M, MD  Insulin  Pen Needle (RELION PEN NEEDLES) 31G X 6 MM MISC USE 1 PEN NEEDLE WITH INSULIN  PEN ONCE DAILY FOR DIABETES 05/05/23   Liana Mardy, NP  OXYGEN  Inhale 4 L into the lungs. PT USES [facility] FOR OXYGEN     [provider]    Physical Exam: Vitals:   04/18/24 9351 04/18/24 0649 04/18/24 0715 04/18/24 0922  BP:   (!) 150/85   Pulse: 78 79 81 (!) 40  Resp: 15 20 (!) 21 17  Temp:   98 F (36.7 C)   TempSrc:   Axillary   SpO2: 95% 95% 91% 98%  Weight:      Height:        Physical Exam   Constitutional: Alert, awake, calm, comfortable HEENT: Neck supple Respiratory: Bilateral decreased air entry, extensive wheezes and occasional rhonchi no rails. Cardiovascular: Regular rate and rhythm, no murmurs / rubs / gallops. No extremity edema. 2+ pedal pulses. No carotid bruits.  1+ bilateral pitting edema on lower extremities. Abdomen: Soft, no tenderness, Bowel sounds positive.  Musculoskeletal: no clubbing / cyanosis. Good ROM, no contractures. Normal muscle tone.  Skin: no rashes, lesions, ulcers. Neurologic: CN 2-12 grossly intact. Sensation intact, No focal deficit identified Psychiatric: Alert and oriented x 3. Normal mood.    Labs on Admission: I have personally reviewed following labs and imaging studies  CBC: Recent Labs  Lab 04/12/24 0312 04/18/24 0622  WBC 7.9 9.6  NEUTROABS  --  6.5  HGB 9.4* 11.0*  HCT 30.4* 36.5  MCV 93.0 94.8  PLT 261 195   Basic Metabolic Panel: Recent Labs  Lab 04/12/24 0312 04/18/24 0622  NA 138 141  K 4.0 4.3  CL 95* 101  CO2 33* 31  GLUCOSE 307* 190*  BUN 25* 16  CREATININE 0.74 0.84  CALCIUM  8.7* 9.4   GFR: Estimated  Creatinine Clearance: 47.8 mL/min (by C-G formula based on SCr of 0.84 mg/dL).  Liver Function Tests: Recent Labs  Lab 04/18/24 0622  AST 45*  ALT 38  ALKPHOS 68  BILITOT 0.6  PROT 6.6  ALBUMIN 3.5   No results for input(s): LIPASE, AMYLASE in the last 168 hours. No results for input(s): AMMONIA in the last 168 hours. Coagulation Profile: No results for input(s): INR, PROTIME in the last 168 hours. Cardiac Enzymes: Recent Labs  Lab 04/18/24 0622  TROPONINIHS 20*   BNP (last 3 results) Recent Labs    03/31/24 1848 04/09/24 0834 04/18/24 0622  BNP 57.5 259.4* 146.8*   HbA1C: No results for input(s): HGBA1C in the last 72 hours. CBG: Recent Labs  Lab 04/11/24 1157 04/11/24 1601 04/11/24 1951 04/12/24 0728  GLUCAP 208* 414* 138* 193*   Lipid Profile: No results for input(s): CHOL, HDL, LDLCALC, TRIG, CHOLHDL, LDLDIRECT in the last 72 hours. Thyroid  Function Tests: No results for input(s): TSH, T4TOTAL, FREET4, T3FREE, THYROIDAB in the last 72 hours. Anemia Panel: No results for input(s): VITAMINB12, FOLATE, FERRITIN, TIBC, IRON , RETICCTPCT in the last 72 hours. Urine analysis:    Component Value Date/Time   COLORURINE STRAW (A) 04/17/2023 0930   APPEARANCEUR CLEAR (A) 04/17/2023 0930   APPEARANCEUR Clear 08/06/2022 1541   LABSPEC 1.014 04/17/2023 0930   PHURINE 6.0 04/17/2023 0930   GLUCOSEU NEGATIVE 04/17/2023 0930   HGBUR MODERATE (A) 04/17/2023 0930   BILIRUBINUR NEGATIVE 04/17/2023 0930   BILIRUBINUR Negative 08/06/2022 1541   KETONESUR NEGATIVE 04/17/2023 0930   PROTEINUR NEGATIVE 04/17/2023 0930   UROBILINOGEN 0.2 04/22/2021 0959   NITRITE NEGATIVE 04/17/2023 0930   LEUKOCYTESUR NEGATIVE 04/17/2023 0930    Radiological Exams on Admission: I have personally reviewed images DG Chest Port 1 View Result Date: 04/18/2024 CLINICAL DATA:  COPD exacerbation. EXAM: PORTABLE CHEST 1 VIEW COMPARISON:  04/09/2024  FINDINGS: Cardiopericardial silhouette is at upper limits of normal for size. Lungs are hyperexpanded. Scattered areas of architectural distortion/scarring are noted bilaterally. Pleuroparenchymal opacity in the right base is mildly improved in the interval. No acute bony abnormality. Telemetry leads overlie the chest. IMPRESSION: Mild interval improvement in right base pleuroparenchymal opacity likely a combination of airspace disease and pleural fluid. Electronically Signed   By: Camellia Candle M.D.   On: 04/18/2024 06:43    EKG: My personal interpretation of EKG shows: Ventricular paced rhythm    Assessment/Plan Principal Problem:   Acute hypoxic on chronic hypercapnic respiratory failure (HCC) Active Problems:   Chronic combined systolic and diastolic CHF (congestive heart failure) (HCC)   HTN (hypertension)   Diabetes mellitus (HCC)   Mixed hyperlipidemia   Chronic respiratory failure with hypoxia (HCC)   Anxiety and depression   MGUS (monoclonal gammopathy of unknown significance)    Assessment and Plan: 68 year old female W/PMH of chronic hypoxemic respiratory failure on home home oxygen  4 L, HTN, DM, HLD, anxiety and depression, MGUS, diastolic congestive heart failure who was recently admitted and discharged from the hospital after being treated for COPD exacerbation now presented to [facility] ED with shortness of breath with CO2 narcosis.  1.  Acute on chronic hypoxemic and hypercarbic respiratory failure likely due to combination of COPD and CHF exacerbation - She will be admitted to hospital as inpatient. - She received IV steroid, Lasix  40 mg x 1 IV nebulization in the emergency room. - Patient is on BiPAP currently. - Patient states that she is feeling much better now and compared to before. - Will check CO2 level with repeat VBG/ABG. - Will continue to titrate oxygen  to  maintain saturation around 90%.  If CO2 is better we can stop BiPAP and put BiPAP only during the night at  sleep. - Continue IV steroid, nebulization and diuretics. - There is no signs of infection or sepsis, no antibiotic at this point.  2.  COPD exacerbation - Treatment as above. - She will need to have repeat VBG/ABG to monitor for CO2 - If no CO2 retention or improved then we can change her to nasal cannula and put her on BiPAP during the night.  3.  HFpEF - There may be some component of fluid retention/exacerbation of congestive heart failure - Patient received 40 mg of Lasix  in the emergency room x 1 - She will be continued on 20 mg of Lasix  IV twice a day. - She normally takes 20 mg of p.o. Lasix  daily. - Daily weight, output input charting - She recently had echocardiogram in June 2025 which showed ejection fraction 60 to 65%.  No need for repeat echocardiogram at this point.  4.  Type 2 diabetes - Will continue her on  insulin  sliding scale and 10 units of Lantus  at bedtime. - Hold oral hypoglycemic medication while she is in the hospital  5.  HTN/HLD - Resume her home medications  6.  CAD - It appears that patient is taking aspirin /Plavix  and statin. - Continue those medications  7.  Anxiety depression - Continue alprazolam  as needed    DVT prophylaxis: Lovenox  Code Status: Full Code Family Communication: Patient's daughter was at bedside Disposition Plan: Home Consults called: None Admission status: Inpatient, Telemetry bed   Nena Rebel, MD [facility] 04/18/2024, 9:35 AM

## 2024-04-18 NOTE — ED Triage Notes (Addendum)
 Pt arrived from home via [facility]. Pt started having difficulty breathing about an hour ago. Pt has a history of COPD, CHF, and a pacemaker. EMS reported that when the fire department checked patient's O2, it was in the 70's. EMS reports that 2 Albuterol  have been given and O2 went up to 95%. Once on a nonrebreather, patient's O2 went up to 99%. EMS reports that patient was tripoding, and breath sounds included crackles and rhonchi. Patient is able to talk and hold a conversation.

## 2024-04-18 NOTE — ED Notes (Signed)
 Notified by NT that when I assisted pt to the bathroom, she remained on her oxygen  the whole time; however, now that she is back in the bed her oxygen  is in the 80s.  Pts oxygen  was 89% on 4L [place] (baseline) when this RN assessed. Pt quickly recovered and oxygen  up to 95%. Provider notified

## 2024-04-18 NOTE — ED Notes (Signed)
 Pt assisted to the toilet in the rm. Pt urinated. Pt assisted back to bed and placed back on cardiac monitor. Call light within reach. Dtr at bedside.

## 2024-04-18 NOTE — ED Notes (Signed)
Respiratory at bedside to place BiPAP

## 2024-04-18 NOTE — Evaluation (Signed)
 Occupational Therapy Evaluation Patient Details Name: Angela Jensen MRN: [id number] DOB: 07/07/1956 Today's Date: 04/18/2024   History of Present Illness   68 y.o. female who was recently admitted to [facility] and was discharged on 04/12/2024 after being treated for COPD exacerbation came in today for complaining of progressive shortness of breath started few hours prior to coming to ED. Admitted for management of Acute on chronic hypoxemic and hypercarbic respiratory failure likely due to combination of COPD and CHF exacerbation. PMH of COPD on 4 L nasal cannula oxygen  at home, CHF s/p pacemaker, HTN, HLD, depression, sleep apnea     Clinical Impressions Pt was seen for OT evaluation this date. PTA, pt resides with her boyfriend and was MOD I with use of rollator and 3-4L 02 at baseline. Reports IND with dressing/bathing. At this time, she is requiring an increase to 6L 02 for all activity, however continues to perform at MOD I to supervision level with all aspects of toileting, ambulation using RW, bed mobility, LB dressing, and standing grooming tasks on evaluation. She requires intermittent rest breaks for 02 recovery with lowest reading of 85% on 6L with quick improvement to 90% and above. Pt was able to be weaned back to her baseline 3L with return to supine and reading of 93%. She has limited activity tolerance, but does not appear to require skilled OT services while hospitalized. Recommend cardiopulmonary rehab upon hospital DC.      If plan is discharge home, recommend the following:   Help with stairs or ramp for entrance     Functional Status Assessment   Patient has not had a recent decline in their functional status     Equipment Recommendations   None recommended by OT     Recommendations for Other Services         Precautions/Restrictions   Precautions Precautions: Fall Recall of Precautions/Restrictions: Intact Restrictions Weight Bearing Restrictions Per  Provider Order: No     Mobility Bed Mobility Overal bed mobility: Modified Independent                  Transfers Overall transfer level: Needs assistance Equipment used: Rolling walker (2 wheels) Transfers: Sit to/from Stand Sit to Stand: Modified independent (Device/Increase time)           General transfer comment: supervision for standing, toilet transfers and ambulkation in room using RW; sp02 dropped to 85% at lowest on 6L during all activity, mostly remained 87-93%; able to return to 3L upon return to bed      Balance Overall balance assessment: Modified Independent                                         ADL either performed or assessed with clinical judgement   ADL Overall ADL's : Needs assistance/impaired     Grooming: Wash/dry hands;Wash/dry face;Oral care;Applying deodorant;Supervision/safety;Sitting;Standing   Upper Body Bathing: Set up;Sitting   Lower Body Bathing: Set up;Supervison/ safety;Sitting/lateral leans;Sit to/from stand       Lower Body Dressing: Supervision/safety;Sitting/lateral leans;Sit to/from stand   Toilet Transfer: Supervision/safety;Rolling walker (2 wheels)   Toileting- Clothing Manipulation and Hygiene: Modified independent;Sitting/lateral lean       Functional mobility during ADLs: Supervision/safety;Rolling walker (2 wheels) General ADL Comments: pt required set up for seated ADLs and supervision for standing ADLs with no LOB and use of RW for ADL transfers on 6L 02  Vision         Perception         Praxis         Pertinent Vitals/Pain Pain Assessment Pain Assessment: Faces Faces Pain Scale: Hurts a little bit Pain Descriptors / Indicators: Discomfort Pain Intervention(s): Monitored during session, Repositioned, Heat applied     Extremity/Trunk Assessment Upper Extremity Assessment Upper Extremity Assessment: Overall WFL for tasks assessed   Lower Extremity Assessment Lower  Extremity Assessment: Generalized weakness   Cervical / Trunk Assessment Cervical / Trunk Assessment: Normal   Communication Communication Communication: No apparent difficulties   Cognition Arousal: Alert Behavior During Therapy: WFL for tasks assessed/performed                                 Following commands: Intact       Cueing  General Comments          Exercises Other Exercises Other Exercises: Edu on role of OT in acute setting, PLB, ECS, and DME/AE/AD to maximize IND/safety and prevent overexertion.   Shoulder Instructions      Home Living Family/patient expects to be discharged to:: Private residence Living Arrangements: Spouse/significant other Available Help at Discharge: Family Type of Home: House Home Access: Stairs to enter Secretary/administrator of Steps: 1 Entrance Stairs-Rails: Right Home Layout: Laundry or work area in basement;Two level;Able to live on main level with bedroom/bathroom     Bathroom Shower/Tub: Chief Strategy Officer: Standard     Home Equipment: Rollator (4 wheels);Cane - single point;Shower seat   Additional Comments: On 3-4L/min O2 at all times.      Prior Functioning/Environment Prior Level of Function : Independent/Modified Independent;Driving             Mobility Comments: PRN use of rollator, otherwise walks without any AD (primarily uses the rollator while cooking, so she can sit). Denies falls in the past 6 months ADLs Comments: Independent with bathing & dressing.    OT Problem List: Decreased activity tolerance   OT Treatment/Interventions:        OT Goals(Current goals can be found in the care plan section)       OT Frequency:       Co-evaluation              AM-PAC OT 6 Clicks Daily Activity     Outcome Measure Help from another person eating meals?: None Help from another person taking care of personal grooming?: None Help from another person toileting,  which includes using toliet, bedpan, or urinal?: None Help from another person bathing (including washing, rinsing, drying)?: None Help from another person to put on and taking off regular upper body clothing?: None Help from another person to put on and taking off regular lower body clothing?: None 6 Click Score: 24   End of Session Equipment Utilized During Treatment: Oxygen ;Gait belt;Rolling walker (2 wheels) Nurse Communication: Mobility status  Activity Tolerance: Patient tolerated treatment well Patient left: in bed;with call bell/phone within reach;with bed alarm set;with family/visitor present  OT Visit Diagnosis: Other abnormalities of gait and mobility (R26.89)                Time: 8477-8443 OT Time Calculation (min): 34 min Charges:  OT General Charges $OT Visit: 1 Visit OT Evaluation $OT Eval Low Complexity: 1 Low OT Treatments $Self Care/Home Management : 8-22 mins Elina Streng, OTR/L 04/18/24, 4:58 PM  Jacquel Redditt E  Rozanna Cormany 04/18/2024, 4:54 PM

## 2024-04-19 ENCOUNTER — Encounter: Attending: Pulmonary Disease

## 2024-04-19 DIAGNOSIS — J9612 Chronic respiratory failure with hypercapnia: Secondary | ICD-10-CM | POA: Diagnosis not present

## 2024-04-19 DIAGNOSIS — J9601 Acute respiratory failure with hypoxia: Secondary | ICD-10-CM | POA: Diagnosis not present

## 2024-04-19 DIAGNOSIS — J449 Chronic obstructive pulmonary disease, unspecified: Secondary | ICD-10-CM | POA: Insufficient documentation

## 2024-04-19 LAB — RESPIRATORY PANEL BY PCR

## 2024-04-19 LAB — CBC
HCT: 35 % — ABNORMAL LOW (ref 36.0–46.0)
Hemoglobin: 10.8 g/dL — ABNORMAL LOW (ref 12.0–15.0)
MCH: 27.8 pg (ref 26.0–34.0)
MCHC: 30.9 g/dL (ref 30.0–36.0)
MCV: 90.2 fL (ref 80.0–100.0)
Platelets: 263 K/uL (ref 150–400)
RBC: 3.88 MIL/uL (ref 3.87–5.11)
RDW: 14.6 % (ref 11.5–15.5)
WBC: 8.1 K/uL (ref 4.0–10.5)
nRBC: 0 % (ref 0.0–0.2)

## 2024-04-19 LAB — BASIC METABOLIC PANEL WITH GFR
Anion gap: 17 — ABNORMAL HIGH (ref 5–15)
BUN: 38 mg/dL — ABNORMAL HIGH (ref 8–23)
CO2: 32 mmol/L (ref 22–32)
Calcium: 9.1 mg/dL (ref 8.9–10.3)
Chloride: 91 mmol/L — ABNORMAL LOW (ref 98–111)
Creatinine, Ser: 0.84 mg/dL (ref 0.44–1.00)
GFR, Estimated: >60 mL/min (ref >60)
Glucose, Bld: 290 mg/dL — ABNORMAL HIGH (ref 70–99)
Potassium: 4.4 mmol/L (ref 3.5–5.1)
Sodium: 140 mmol/L (ref 135–145)

## 2024-04-19 LAB — GLUCOSE, CAPILLARY
Glucose-Capillary: 242 mg/dL — ABNORMAL HIGH (ref 70–99)
Glucose-Capillary: 275 mg/dL — ABNORMAL HIGH (ref 70–99)
Glucose-Capillary: 264 mg/dL — ABNORMAL HIGH (ref 70–99)
Glucose-Capillary: 195 mg/dL — ABNORMAL HIGH (ref 70–99)

## 2024-04-19 LAB — C-REACTIVE PROTEIN: CRP: 1.5 mg/dL — ABNORMAL HIGH (ref <1.0)

## 2024-04-19 MED ORDER — TIOTROPIUM BROMIDE MONOHYDRATE 18 MCG IN CAPS: 18.0000 ug | ORAL_CAPSULE | Freq: Every day | RESPIRATORY_TRACT | Status: DC

## 2024-04-19 MED ORDER — FERROUS SULFATE 325 (65 FE) MG PO TABS
325.0000 mg | ORAL_TABLET | Freq: Every day | ORAL | Status: DC
  Administered 2024-04-20 – 2024-04-21 (×2): 325 mg via ORAL
  Filled 2024-04-19 (×2): qty 1

## 2024-04-19 MED ORDER — BUDESON-GLYCOPYRROL-FORMOTEROL 160-9-4.8 MCG/ACT IN AERO
2.0000 | INHALATION_SPRAY | Freq: Two times a day (BID) | RESPIRATORY_TRACT | Status: DC
  Administered 2024-04-19 – 2024-04-21 (×5): 2 via RESPIRATORY_TRACT
  Filled 2024-04-19: qty 5.9

## 2024-04-19 MED ORDER — FLUTICASONE FUROATE-VILANTEROL 200-25 MCG/ACT IN AEPB
1.0000 | INHALATION_SPRAY | Freq: Every day | RESPIRATORY_TRACT | Status: DC
  Filled 2024-04-19: qty 28

## 2024-04-19 MED ORDER — CYCLOBENZAPRINE HCL 10 MG PO TABS
10.0000 mg | ORAL_TABLET | Freq: Every evening | ORAL | Status: DC | PRN
  Administered 2024-04-19: 10 mg via ORAL
  Filled 2024-04-19: qty 1

## 2024-04-19 MED ORDER — INSULIN ASPART 100 UNIT/ML IJ SOLN
3.0000 [IU] | Freq: Three times a day (TID) | INTRAMUSCULAR | Status: DC
  Administered 2024-04-19 – 2024-04-20 (×4): 3 [IU] via SUBCUTANEOUS
  Filled 2024-04-19 (×5): qty 1

## 2024-04-19 MED ORDER — INSULIN GLARGINE 100 UNIT/ML ~~LOC~~ SOLN
8.0000 [IU] | Freq: Every day | SUBCUTANEOUS | Status: DC
  Administered 2024-04-19 – 2024-04-20 (×2): 8 [IU] via SUBCUTANEOUS
  Filled 2024-04-19 (×3): qty 0.08

## 2024-04-19 MED ORDER — ESCITALOPRAM OXALATE 10 MG PO TABS
10.0000 mg | ORAL_TABLET | Freq: Every day | ORAL | Status: DC
Start: 2024-04-19 — End: 2024-04-21
  Administered 2024-04-19 – 2024-04-21 (×3): 10 mg via ORAL
  Filled 2024-04-19 (×3): qty 1

## 2024-04-19 MED ORDER — PREGABALIN 75 MG PO CAPS
75.0000 mg | ORAL_CAPSULE | Freq: Two times a day (BID) | ORAL | Status: DC
  Administered 2024-04-19 – 2024-04-21 (×5): 75 mg via ORAL
  Filled 2024-04-19 (×5): qty 1

## 2024-04-19 MED ORDER — PREDNISONE 50 MG PO TABS
50.0000 mg | ORAL_TABLET | Freq: Every day | ORAL | Status: DC
  Administered 2024-04-20 – 2024-04-21 (×2): 50 mg via ORAL
  Filled 2024-04-19 (×2): qty 1

## 2024-04-19 NOTE — TOC Initial Note (Addendum)
 Transition of Care [facility]) - Initial/Assessment Note    Patient Details  Name: Angela Jensen MRN: [id number] Date of Birth: July 17, 1956  [facility]) CM/SW Contact:    Lauraine JAYSON Carpen, LCSW Phone Number: 04/19/2024, 12:08 PM  Clinical Narrative:  Readmission prevention screen complete. CSW met with patient. Family member asleep at bedside. CSW introduced role and explained that discharge planning would be discussed. PCP is Mardy Maxin, NP. Her boyfriend drives her to appointments. [facility] [address]. No issues obtaining medications. Patient lives home with her boyfriend. She is active with [facility] for PT, RN. PT and OT recommended cardiopulmonary rehab. [facility] liaison confirmed patient would have to choose one or the other. Patient prefers cardiopulmonary rehab. Sent secure chat to [place] with the clinic to see how to get that referral in. Patient has a RW, cane, rollator, BSC, and shower chair at home. She also has home oxygen  and an NIV through [employer]. No further concerns. CSW will continue to follow patient for support and facilitate return home once stable. Her boyfriend will transport her home at discharge.                2:53 pm: Cardiology PA entered order for cardiopulmonary rehab and said that someone should call her after discharge to have her come in for an evaluation. [facility] liaison is aware and said they can still provide nursing at discharge.  Expected Discharge Plan: OP Rehab Barriers to Discharge: Continued Medical Work up   Patient Goals and CMS Choice            Expected Discharge Plan and Services     Post Acute Care Choice:  (Cardiopulmonary rehab) Living arrangements for the past 2 months: Single Family Home                                      Prior Living Arrangements/Services Living arrangements for the past 2 months: Single Family Home Lives with:: Significant Other Patient language and need  for interpreter reviewed:: Yes Do you feel safe going back to the place where you live?: Yes      Need for Family Participation in Patient Care: Yes (Comment) Care giver support system in place?: Yes (comment) Current home services: DME, Home PT, Home RN Criminal Activity/Legal Involvement Pertinent to Current Situation/Hospitalization: No - Comment as needed  Activities of Daily Living   ADL Screening (condition at time of admission) Independently performs ADLs?: Yes (appropriate for developmental age) Is the patient deaf or have difficulty hearing?: No Does the patient have difficulty seeing, even when wearing glasses/contacts?: No Does the patient have difficulty concentrating, remembering, or making decisions?: No  Permission Sought/Granted                  Emotional Assessment Appearance:: Appears stated age Attitude/Demeanor/Rapport: Engaged, Gracious Affect (typically observed): Accepting, Appropriate, Calm, Pleasant Orientation: : Oriented to Self, Oriented to Place, Oriented to  Time, Oriented to Situation Alcohol / Substance Use: Not Applicable Psych Involvement: No (comment)  Admission diagnosis:  COPD exacerbation (HCC) [J44.1] Acute respiratory failure with hypoxia and hypercapnia (HCC) [J96.01, J96.02] Acute hypoxic on chronic hypercapnic respiratory failure (HCC) [J96.01, J96.12] Patient Active Problem List   Diagnosis Date Noted   Acute hypoxic on chronic hypercapnic respiratory failure (HCC) 04/18/2024   Hemoptysis 04/12/2024   COPD (chronic obstructive pulmonary disease) (HCC) 04/09/2024   Polyp of  transverse colon 04/03/2024   Hematochezia 03/31/2024   Acute on chronic hypoxic respiratory failure (HCC) 03/20/2024   COPD with acute exacerbation (HCC) 03/19/2024   Chronic heart failure with preserved ejection fraction (HFpEF) (HCC) 01/28/2024   SSS (sick sinus syndrome) s/p leadless pacemaker (HCC) 01/28/2024   S/p femoral endarterectomy 01/21/2024  01/27/2024   Atherosclerosis of artery of extremity with rest pain (HCC) 01/18/2024   Ischemic leg 01/17/2024   Presbycusis of both ears 08/13/2023   Acute on chronic respiratory failure with hypoxia and hypercapnia (HCC) 06/15/2023   Acute on chronic diastolic CHF (congestive heart failure) (HCC) 06/15/2023   Transaminitis 06/15/2023   Overweight (BMI 25.0-29.9) 06/15/2023   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   Chronic right shoulder pain 11/30/2022   Anxiety and depression 11/19/2022   GERD without esophagitis 11/19/2022   Uncontrolled type 2 diabetes mellitus with hyperglycemia, with long-term current use of insulin  (HCC) 11/19/2022   Right rib fracture 09/23/2022   Symptomatic bradycardia 07/09/2021   Chronic respiratory failure with hypoxia (HCC)    CAP (community acquired pneumonia) 06/26/2021   COPD exacerbation (HCC) 06/11/2021   SOB (shortness of breath) 05/01/2021   Chronic combined systolic and diastolic CHF (congestive heart failure) (HCC) 05/01/2021   Acute exacerbation of chronic obstructive pulmonary disease (COPD) (HCC) 04/30/2021   Elevated troponin 04/01/2021   Acute on chronic respiratory failure with hypoxia (HCC) 03/31/2021   History of recurrent right pneumothorax 05/01/2020   Chest pain on breathing 04/21/2020   Pleuritic chest pain 04/11/2020   Right lower lobe pulmonary nodule 04/02/2020   IDA (iron  deficiency anemia) 04/02/2020   Pica in adults 04/02/2020   Goals of care, counseling/discussion 03/14/2020   Pain in left wrist 09/24/2019   Encounter for general adult medical examination with abnormal findings 06/28/2019   Elevated total protein 01/22/2019   Uncontrolled type 2 diabetes mellitus with hyperglycemia (HCC) 05/16/2018   Chronic obstructive pulmonary disease (HCC) 05/16/2018   Vitamin D  deficiency 05/16/2018   Flu vaccine need 05/16/2018   Dysuria 05/16/2018   Cancer of lower lobe of right lung (HCC) 01/21/2018    Screening for breast cancer 10/12/2017   Personal history of tobacco use, presenting hazards to health 02/03/2017   PVD (peripheral vascular disease) (HCC) 06/22/2016   HTN (hypertension) 05/26/2016   Diabetes mellitus (HCC) 05/26/2016   Mixed hyperlipidemia 05/26/2016   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/2016   Uterine leiomyoma 04/30/2016   Elevated CEA 01/31/2016   Special screening for malignant neoplasms, colon    Benign neoplasm of sigmoid colon    Primary lung cancer (HCC)    PCP:  Liana Fish, NP Pharmacy:   [facility] [address] ([address]), [place] - [address] [place]) [place] [address] Phone: [phone] Fax: [phone]  [facility] [address] [place] [address] Phone: [phone] Fax: [phone]     Social Drivers of Health (SDOH) Social History: SDOH Screenings   Food Insecurity: Patient Declined (04/18/2024)  Housing: Low Risk  (04/18/2024)  Transportation Needs: No Transportation Needs (04/18/2024)  Utilities: Not At Risk (04/18/2024)  Alcohol Screen: Low Risk  (12/11/2021)  Depression (PHQ2-9): Medium Risk (04/13/2024)  Social Connections: Moderately Integrated (04/18/2024)  Tobacco Use: Medium Risk (04/18/2024)   SDOH Interventions: Food Insecurity Interventions: Patient Declined Housing Interventions: Intervention Not Indicated Social Connections Interventions: Patient Declined   Readmission Risk Interventions    04/19/2024   12:06  PM 03/21/2024    3:56 PM 01/26/2024   10:40 AM  Readmission Risk Prevention Plan  Transportation Screening Complete Complete Complete  PCP or Specialist Appt within 3-5 Days   Complete  HRI or Home Care Consult  Complete   Social Work Consult for Recovery Care Planning/Counseling  Complete Complete  Palliative Care Screening  Not Applicable Not Applicable  Medication  Review Oceanographer) Complete Complete Complete  PCP or Specialist appointment within 3-5 days of discharge Complete    SW Recovery Care/Counseling Consult Complete    Palliative Care Screening Not Applicable    Skilled Nursing Facility Not Applicable

## 2024-04-19 NOTE — Progress Notes (Addendum)
   04/19/24 1610  Therapy Vitals  Temp 98.6 F (37 C)  Pulse Rate (!) 47  Resp 18  BP (!) 104/45  Patient Position (if appropriate) Lying  Oxygen  Therapy  SpO2 95 %  O2 Device Nasal Cannula  O2 Flow Rate (L/min) 4 L/min  Mobility  Activity Ambulated with assistance;Stood at bedside  Level of Assistance Contact guard assist, steadying assist  Assistive Device Four wheel walker  Distance Ambulated (ft) 210 ft  Range of Motion/Exercises Active Assistive  Activity Response Tolerated well  Mobility Referral Yes  Mobility visit 1 Mobility  Mobility Specialist Start Time (ACUTE ONLY) 1530  Mobility Specialist Stop Time (ACUTE ONLY) 1603  Mobility Specialist Time Calculation (min) (ACUTE ONLY) 33 min   Mobility Specialist - Progress Note  PT upon entry was in bed. Pt ambulated well with O2 tank. O2 levels during ambulation stayed above 90@ 6 LPM (needed per PT note this AM). After activity O2 levels dropped to 88. Positioned in bed alarm set needs in close proximity O2 levels @91  on 4 LPM and climbing. Returned to baseline 4 LPM at the end of session.  Clem Rodes Mobility Specialist 04/19/24, 4:24 PM

## 2024-04-19 NOTE — Care Management Important Message (Signed)
 Important Message  Patient Details  Name: Angela Jensen MRN: [id number] Date of Birth: Oct 21, 1955   Important Message Given:  Yes - Medicare IM     [name] 04/19/2024, 11:55 AM

## 2024-04-19 NOTE — Plan of Care (Signed)

## 2024-04-19 NOTE — Plan of Care (Signed)

## 2024-04-19 NOTE — Progress Notes (Signed)
 Heart Failure Navigator Progress Note  Assessed for Heart & Vascular TOC clinic readiness.  Patient does not meet criteria due to current [facility] patient.   Navigator will sign off at this time.  Charmaine Pines, RN, BSN [facility] Heart Failure Navigator Secure Chat Only

## 2024-04-19 NOTE — Inpatient Diabetes Management (Signed)
 Inpatient Diabetes Program Recommendations  AACE/ADA: New Consensus Statement on Inpatient Glycemic Control   Target Ranges:  Prepandial:   less than 140 mg/dL      Peak postprandial:   less than 180 mg/dL (1-2 hours)      Critically ill patients:  140 - 180 mg/dL    Latest Reference Range & Units 01/12/24 16:04  Hemoglobin A1C 4.0 - 5.6 % 8.0 !    Latest Reference Range & Units 04/18/24 17:36 04/18/24 21:03 04/19/24 07:58  Glucose-Capillary 70 - 99 mg/dL 695 (H)  Novolog  7 units @ 1804  Solu-medrol  80mg  @ 2008 203 (H)  Novolog  2 units     242 (H)   Review of Glycemic Control  Diabetes history: DM2 Outpatient Diabetes medications: Glargine (SolostarPen) 10 units daily, Metformin  1,000 mg daily. Decadron  1mg  daily.  Current orders for Inpatient glycemic control: Novolog  0-9 units TID with meals, Novolog  0-5 units HS, Solu-Medol 80 mg q 12hrs.   Inpatient Diabetes Program Recommendations: Blood sugar was 242 this morning. If steroids are continued, please consider ordering Novolog  3 units TID with meals for meal coverage if patient eats at least 50% of meals and Glargine 10 units daily.    Thanks,  Lavanda Search, RN, MSN, [facility]  Inpatient Diabetes Coordinator  Pager [phone] (8a-5p)

## 2024-04-19 NOTE — Progress Notes (Signed)
 PROGRESS NOTE    Angela Jensen   FMW:[id number] DOB: 11/27/1955  DOA: 04/18/2024 Date of Service: 04/19/24 which is hospital day 1  PCP: Liana Fish, NP     Hospital course / significant events:   HPI: Angela Jensen is a pleasant 68 y.o. female with medical history significant for COPD on 4 L nasal cannula oxygen  at home, CHF s/p pacemaker, HTN, HLD, depression, sleep apnea who was recently admitted to [facility] and was discharged on 04/12/2024 after being treated for COPD exacerbation came in 04/18/24 for complaining of progressive shortness of breath started few hours prior to coming to ED.   09/02: to ED. 178/112, hypoxemic requiring BiPAP, x-ray did not show obvious pneumonia but there was mild pleural effusion on the right side, VBG showed hypercapnia around 90 and low pH. Patient was initiated on BiPAP, received 40 mg of IV Lasix  one-time, steroid, nebulization. Admitted to hospitalist w/ concern for COPD + CHF.  09/03: pulmonary consult. Of note, pt reports drinking a lot of an herbal tea recently which claims to detox the lungs question mild fluid overload / unknown effects from alternative unregulated treatments. Continue current tx as she is responding appropriately    Consultants:  Pulmonology   Procedures/Surgeries: none      ASSESSMENT & PLAN:   Acute on chronic hypoxemic and hypercarbic respiratory failure likely due to combination of COPD and possibly mild CHF exacerbation BiPAP wean as able / escalate as needed, has been on baseline O2 through the day Treat underlying cause(s) as below   COPD Steroids Nebs BiPAP at bedtime / prn  Pulmonary consult CRP pending  Advised there is no detox for the lungs and she should not buy OTC products claiming to do this   HFpEF in acute exacerbation Echocardiogram in June 2025 which showed ejection fraction 60 to 65%.  No need for repeat echocardiogram at this point. Diuresis Strict I&O Fluid restriction  Type 2  diabetes continue her on  insulin  sliding scale and 10 units of Lantus  at bedtime. Hold oral hypoglycemic medication while she is in the hospital   HTN/HLD Resume her home medications   CAD aspirin /Plavix  and statin.  Anxiety depression Continue alprazolam  as needed      Class 2 obesity based on BMI: Body mass index is 36.78 kg/m.SABRA Significantly low or high BMI is associated with higher medical risk.  Underweight - under 18  overweight - 25 to 29 obese - 30 or more Class 1 obesity: BMI of 30.0 to 34 Class 2 obesity: BMI of 35.0 to 39 Class 3 obesity: BMI of 40.0 to 49 Super Morbid Obesity: BMI 50-59 Super-super Morbid Obesity: BMI 60+ Healthy nutrition and physical activity advised as adjunct to other disease management and risk reduction treatments    DVT prophylaxis: lovenox  IV fluids: no continuous IV fluids  Nutrition: cardiac/diabetic Central lines / other devices: none  Code Status: FULL CODE ACP documentation reviewed:  none on file in [facility]  TOC needs: TBD Medical barriers to dispo: SOB/COPD tx, diuresing. Expected medical readiness for discharge tomorrow / day after .              Subjective / Brief ROS:  Patient reports doing better today Denies CP/SOB at rest  Pain controlled.  Denies new weakness.  Tolerating diet.  Reports no concerns w/ urination/defecation.   Family Communication: none at this time    Objective Findings:  Vitals:   04/19/24 0500 04/19/24 0744 04/19/24 1048 04/19/24 1610  BP:  119/67 (!) 109/58 (!) 104/45  Pulse:  80 (!) 47 (!) 47  Resp:  16 17 18   Temp:  98.7 F (37.1 C) 98.4 F (36.9 C) 98.6 F (37 C)  TempSrc:      SpO2:  97% 96% 95%  Weight: 82.6 kg     Height:        Intake/Output Summary (Last 24 hours) at 04/19/2024 1724 Last data filed at 04/19/2024 1414 Gross per 24 hour  Intake 480 ml  Output 700 ml  Net -220 ml   Filed Weights   04/18/24 0617 04/19/24 0500  Weight: 53.1 kg 82.6 kg     Examination:  Physical Exam Constitutional:      General: She is not in acute distress. Cardiovascular:     Rate and Rhythm: Normal rate and regular rhythm.  Pulmonary:     Breath sounds: Decreased breath sounds and rhonchi (scattered) present.  Skin:    General: Skin is warm and dry.  Neurological:     Mental Status: She is alert and oriented to person, place, and time.  Psychiatric:        Mood and Affect: Mood normal.        Behavior: Behavior normal.          Scheduled Medications:   aspirin  EC  81 mg Oral Daily   atorvastatin   10 mg Oral Daily   budesonide -glycopyrrolate -formoterol   2 puff Inhalation BID   clopidogrel   75 mg Oral Daily   enoxaparin  (LOVENOX ) injection  40 mg Subcutaneous Q24H   escitalopram   10 mg Oral Daily   [START ON 04/20/2024] ferrous sulfate   325 mg Oral Q breakfast   furosemide   20 mg Intravenous BID   insulin  aspart  0-5 Units Subcutaneous QHS   insulin  aspart  0-9 Units Subcutaneous TID WC   insulin  aspart  3 Units Subcutaneous TID WC   insulin  glargine  8 Units Subcutaneous QHS   metoprolol  succinate  50 mg Oral Daily   pantoprazole   40 mg Oral Daily   [START ON 04/20/2024] predniSONE   50 mg Oral Q breakfast   pregabalin   75 mg Oral BID    Continuous Infusions:   PRN Medications:  acetaminophen  **OR** acetaminophen , ALPRAZolam , cyclobenzaprine , ipratropium-albuterol , ondansetron  **OR** ondansetron  (ZOFRAN ) IV, oxyCODONE -acetaminophen , polyethylene glycol  Antimicrobials from admission:  Anti-infectives (From admission, onward)    None           Data Reviewed:  I have personally reviewed the following...  CBC: Recent Labs  Lab 04/18/24 0622 04/19/24 0413  WBC 9.6 8.1  NEUTROABS 6.5  --   HGB 11.0* 10.8*  HCT 36.5 35.0*  MCV 94.8 90.2  PLT 195 263   Basic Metabolic Panel: Recent Labs  Lab 04/18/24 0622 04/19/24 0413  NA 141 140  K 4.3 4.4  CL 101 91*  CO2 31 32  GLUCOSE 190* 290*  BUN 16 38*   CREATININE 0.84 0.84  CALCIUM  9.4 9.1   GFR: Estimated Creatinine Clearance: 59.7 mL/min (by C-G formula based on SCr of 0.84 mg/dL). Liver Function Tests: Recent Labs  Lab 04/18/24 0622  AST 45*  ALT 38  ALKPHOS 68  BILITOT 0.6  PROT 6.6  ALBUMIN 3.5   No results for input(s): LIPASE, AMYLASE in the last 168 hours. No results for input(s): AMMONIA in the last 168 hours. Coagulation Profile: No results for input(s): INR, PROTIME in the last 168 hours. Cardiac Enzymes: No results for input(s): CKTOTAL, CKMB, CKMBINDEX, TROPONINI in the last 168  hours. BNP (last 3 results) No results for input(s): PROBNP in the last 8760 hours. HbA1C: No results for input(s): HGBA1C in the last 72 hours. CBG: Recent Labs  Lab 04/18/24 1736 04/18/24 2103 04/19/24 0758 04/19/24 1207 04/19/24 1655  GLUCAP 304* 203* 242* 275* 264*   Lipid Profile: No results for input(s): CHOL, HDL, LDLCALC, TRIG, CHOLHDL, LDLDIRECT in the last 72 hours. Thyroid  Function Tests: No results for input(s): TSH, T4TOTAL, FREET4, T3FREE, THYROIDAB in the last 72 hours. Anemia Panel: No results for input(s): VITAMINB12, FOLATE, FERRITIN, TIBC, IRON , RETICCTPCT in the last 72 hours. Most Recent Urinalysis On File:     Component Value Date/Time   COLORURINE STRAW (A) 04/17/2023 0930   APPEARANCEUR CLEAR (A) 04/17/2023 0930   APPEARANCEUR Clear 08/06/2022 1541   LABSPEC 1.014 04/17/2023 0930   PHURINE 6.0 04/17/2023 0930   GLUCOSEU NEGATIVE 04/17/2023 0930   HGBUR MODERATE (A) 04/17/2023 0930   BILIRUBINUR NEGATIVE 04/17/2023 0930   BILIRUBINUR Negative 08/06/2022 1541   KETONESUR NEGATIVE 04/17/2023 0930   PROTEINUR NEGATIVE 04/17/2023 0930   UROBILINOGEN 0.2 04/22/2021 0959   NITRITE NEGATIVE 04/17/2023 0930   LEUKOCYTESUR NEGATIVE 04/17/2023 0930   Sepsis Labs: @LABRCNTIP (procalcitonin:4,lacticidven:4) Microbiology: Recent Results (from the  past 240 hours)  Resp panel by RT-PCR (RSV, Flu A&B, Covid) Anterior Nasal Swab     Status: None   Collection Time: 04/18/24  6:22 AM   Specimen: Anterior Nasal Swab  Result Value Ref Range Status   SARS Coronavirus 2 by RT PCR NEGATIVE NEGATIVE Final    Comment: (NOTE) SARS-CoV-2 target nucleic acids are NOT DETECTED.  The SARS-CoV-2 RNA is generally detectable in upper respiratory specimens during the acute phase of infection. The lowest concentration of SARS-CoV-2 viral copies this assay can detect is 138 copies/mL. A negative result does not preclude SARS-Cov-2 infection and should not be used as the sole basis for treatment or other patient management decisions. A negative result may occur with  improper specimen collection/handling, submission of specimen other than nasopharyngeal swab, presence of viral mutation(s) within the areas targeted by this assay, and inadequate number of viral copies(<138 copies/mL). A negative result must be combined with clinical observations, patient history, and epidemiological information. The expected result is Negative.  Fact Sheet for Patients:  BloggerCourse.com  Fact Sheet for Healthcare Providers:  SeriousBroker.it  This test is no t yet approved or cleared by the [place]  FDA and  has been authorized for detection and/or diagnosis of SARS-CoV-2 by FDA under an Emergency Use Authorization (EUA). This EUA will remain  in effect (meaning this test can be used) for the duration of the COVID-19 declaration under Section 564(b)(1) of the Act, 21 U.S.C.section 360bbb-3(b)(1), unless the authorization is terminated  or revoked sooner.       Influenza A by PCR NEGATIVE NEGATIVE Final   Influenza B by PCR NEGATIVE NEGATIVE Final    Comment: (NOTE) The Xpert Xpress SARS-CoV-2/FLU/RSV plus assay is intended as an aid in the diagnosis of influenza from Nasopharyngeal swab specimens  and should not be used as a sole basis for treatment. Nasal washings and aspirates are unacceptable for Xpert Xpress SARS-CoV-2/FLU/RSV testing.  Fact Sheet for Patients: BloggerCourse.com  Fact Sheet for Healthcare Providers: SeriousBroker.it  This test is not yet approved or cleared by the [place]  FDA and has been authorized for detection and/or diagnosis of SARS-CoV-2 by FDA under an Emergency Use Authorization (EUA). This EUA will remain in effect (meaning this test can be used) for the  duration of the COVID-19 declaration under Section 564(b)(1) of the Act, 21 U.S.C. section 360bbb-3(b)(1), unless the authorization is terminated or revoked.     Resp Syncytial Virus by PCR NEGATIVE NEGATIVE Final    Comment: (NOTE) Fact Sheet for Patients: BloggerCourse.com  Fact Sheet for Healthcare Providers: SeriousBroker.it  This test is not yet approved or cleared by the [place]  FDA and has been authorized for detection and/or diagnosis of SARS-CoV-2 by FDA under an Emergency Use Authorization (EUA). This EUA will remain in effect (meaning this test can be used) for the duration of the COVID-19 declaration under Section 564(b)(1) of the Act, 21 U.S.C. section 360bbb-3(b)(1), unless the authorization is terminated or revoked.  Performed at [facility], [address] [place], [place] [address]       Radiology Studies last 3 days: [facility] 1 View Result Date: 04/18/2024 CLINICAL DATA:  COPD exacerbation. EXAM: PORTABLE CHEST 1 VIEW COMPARISON:  04/09/2024 FINDINGS: Cardiopericardial silhouette is at upper limits of normal for size. Lungs are hyperexpanded. Scattered areas of architectural distortion/scarring are noted bilaterally. Pleuroparenchymal opacity in the right base is mildly improved in the interval. No acute bony abnormality. Telemetry leads overlie  the chest. IMPRESSION: Mild interval improvement in right base pleuroparenchymal opacity likely a combination of airspace disease and pleural fluid. Electronically Signed   By: Camellia Candle M.D.   On: 04/18/2024 06:43         Quinzell Malcomb, DO [facility] 04/19/2024, 5:24 PM    Dictation software may have been used to generate the above note. Typos may occur and escape review in typed/dictated notes. Please contact Dr Marsa directly for clarity if needed.  Staff may message me via secure chat in Epic  but this may not receive an immediate response,  please page me for urgent matters!  If 7PM-7AM, please contact night coverage [email or web address]

## 2024-04-19 NOTE — Hospital Course (Addendum)
 Hospital course / significant events:   HPI: Angela Jensen is a pleasant 68 y.o. female with medical history significant for COPD on 4 L nasal cannula oxygen  at home, CHF s/p pacemaker, HTN, HLD, depression, sleep apnea who was recently admitted to [facility] and was discharged on 04/12/2024 after being treated for COPD exacerbation came in 04/18/24 for complaining of progressive shortness of breath started few hours prior to coming to ED.   09/02: to ED. 178/112, hypoxemic requiring BiPAP, x-ray did not show obvious pneumonia but there was mild pleural effusion on the right side, VBG showed hypercapnia around 90 and low pH. Patient was initiated on BiPAP, received 40 mg of IV Lasix  one-time, steroid, nebulization. Admitted to hospitalist w/ concern for COPD + CHF.  09/03: pulmonary consult. Of note, pt reports drinking a lot of an herbal tea recently which claims to detox the lungs question mild fluid overload / unknown effects from alternative unregulated treatments. To po steroids.  09/04: reduce metoprolol  d/t occasional bradycardia, resume losartan  at lower dose. Pt reports still SOB on exertion and requests another night in hospital.   Consultants:  Pulmonology   Procedures/Surgeries: none      ASSESSMENT & PLAN:   Acute on chronic hypoxemic and hypercarbic respiratory failure likely due to combination of COPD and possibly mild CHF exacerbation BiPAP wean as able / escalate as needed, has been on baseline O2 through the day but SOB on exertion above baseline symptoms Treat underlying cause(s) as below   COPD Steroids Nebs BiPAP at bedtime / prn  Pulmonary consult Advised there is no detox for the lungs and she should not buy OTC products claiming to do this   HFpEF in acute exacerbation Echocardiogram in June 2025 which showed ejection fraction 60 to 65%.  No need for repeat echocardiogram at this point. Diuresis held, euvolemic today  Strict I&O Fluid restriction  Type 2  diabetes continue her on  insulin  sliding scale and 10 units of Lantus  at bedtime. Hold oral hypoglycemic medication while she is in the hospital   HTN/HLD Resume her home medications   CAD aspirin /Plavix  and statin.  Anxiety depression Continue alprazolam  as needed      Class 2 obesity based on BMI: Body mass index is 36.78 kg/m.SABRA Significantly low or high BMI is associated with higher medical risk.  Underweight - under 18  overweight - 25 to 29 obese - 30 or more Class 1 obesity: BMI of 30.0 to 34 Class 2 obesity: BMI of 35.0 to 39 Class 3 obesity: BMI of 40.0 to 49 Super Morbid Obesity: BMI 50-59 Super-super Morbid Obesity: BMI 60+ Healthy nutrition and physical activity advised as adjunct to other disease management and risk reduction treatments    DVT prophylaxis: lovenox  IV fluids: no continuous IV fluids  Nutrition: cardiac/diabetic Central lines / other devices: none  Code Status: FULL CODE ACP documentation reviewed:  none on file in [facility]YNCA  TOC needs: [facility] Medical barriers to dispo: SOB/COPD tx,. Expected medical readiness for discharge tomorrowif on home O2

## 2024-04-19 NOTE — Progress Notes (Signed)
 Physical Therapy Treatment Patient Details Name: Angela Jensen MRN: [id number] DOB: 03-May-1956 Today's Date: 04/19/2024   History of Present Illness 68 y.o. female who was recently admitted to [facility] and was discharged on 04/12/2024 after being treated for COPD exacerbation came in today for complaining of progressive shortness of breath started few hours prior to coming to ED. Admitted for management of Acute on chronic hypoxemic and hypercarbic respiratory failure likely due to combination of COPD and CHF exacerbation. PMH of COPD on 4 L nasal cannula oxygen  at home, CHF s/p pacemaker, HTN, HLD, depression, sleep apnea    PT Comments  Pt received upright in bed agreeable to PT. Reports needing to go to bathroom, feels like her bed is wet from urination. Pt provided rollator to assess safe use and for energy conservation training due to limitations in gait due to her SOB. Pt resting on 4 L/min SPO2 88-90% at rest. Ambulates to bathroom with safe use of rollator. Does demo SOB with SPO2 at 84%. Titrated to 6 L/min with quick return to 92%. Pt independent with urination, pericare, STS to rollator. Requires seated rest in rollator due to fatigue. VC's for use of brakes. After 2-3 min rest, good tolerance for gait into hallway and back to bed maintaining SPO2 at 88% post gait. Titrated back to 4 L/min maintaining > 90% after ~2 minutes seated rest. Pt with good understanding of rollator and need for rest due to her acute SOB and weakness. Encouraged use of rollator to assist in needed rest breaks. Pt understanding of education. Good progress towards PT goals but still requiring greater O2 needs compared to baseline. D/c recs remain appropriate.    If plan is discharge home, recommend the following: Help with stairs or ramp for entrance   Can travel by private vehicle        Equipment Recommendations  None recommended by PT    Recommendations for Other Services       Precautions / Restrictions  Precautions Precautions: Fall Recall of Precautions/Restrictions: Intact Restrictions Weight Bearing Restrictions Per Provider Order: No     Mobility  Bed Mobility Overal bed mobility: Modified Independent               Patient Response: Cooperative  Transfers Overall transfer level: Needs assistance Equipment used: Rollator (4 wheels) Transfers: Sit to/from Stand Sit to Stand: Modified independent (Device/Increase time)           General transfer comment: Does need VC's for safe brake use    Ambulation/Gait Ambulation/Gait assistance: Supervision Gait Distance (Feet): 84 Feet Assistive device: Rolling walker (2 wheels) Gait Pattern/deviations: Step-through pattern       General Gait Details: total gait in room, bathroom, and to hallway. Needs x1 seated rest break post bathroom due to LE fatigue. Requires 6 L/min with mobility with desat to 85-88%.   Stairs             Wheelchair Mobility     Tilt Bed Tilt Bed Patient Response: Cooperative  Modified Rankin (Stroke Patients Only)       Balance Overall balance assessment: Modified Independent                                          Communication Communication Communication: No apparent difficulties  Cognition Arousal: Alert Behavior During Therapy: WFL for tasks assessed/performed   PT - Cognitive impairments: No apparent  impairments                         Following commands: Intact      Cueing    Exercises Other Exercises Other Exercises: energy conservation techniques using rollator due to SOB with mobility.    General Comments        Pertinent Vitals/Pain Pain Assessment Pain Assessment: No/denies pain    Home Living                          Prior Function            PT Goals (current goals can now be found in the care plan section) Acute Rehab PT Goals Patient Stated Goal: return home PT Goal Formulation: With patient Time  For Goal Achievement: 05/02/24 Potential to Achieve Goals: Good Progress towards PT goals: Progressing toward goals    Frequency    Min 3X/week      PT Plan      Co-evaluation              AM-PAC PT 6 Clicks Mobility   Outcome Measure  Help needed turning from your back to your side while in a flat bed without using bedrails?: None Help needed moving from lying on your back to sitting on the side of a flat bed without using bedrails?: None Help needed moving to and from a bed to a chair (including a wheelchair)?: A Little Help needed standing up from a chair using your arms (e.g., wheelchair or bedside chair)?: A Little Help needed to walk in hospital room?: A Little Help needed climbing 3-5 steps with a railing? : A Little 6 Click Score: 20    End of Session Equipment Utilized During Treatment: Oxygen  Activity Tolerance: Patient tolerated treatment well Patient left: in bed;with call bell/phone within reach;with bed alarm set;with family/visitor present Nurse Communication: Mobility status PT Visit Diagnosis: Muscle weakness (generalized) (M62.81);Difficulty in walking, not elsewhere classified (R26.2)     Time: 8950-8879 PT Time Calculation (min) (ACUTE ONLY): 31 min  Charges:    $Therapeutic Activity: 23-37 mins PT General Charges $$ ACUTE PT VISIT: 1 Visit                    [name]. Fairly IV, PT, DPT Physical Therapist- [place]  [facility] 04/19/2024, 1:01 PM

## 2024-04-19 NOTE — Consult Note (Signed)
 PULMONOLOGY         Date: 04/19/2024,   MRN# [id number] Angela Jensen September 24, 1955     AdmissionWeight: 53.1 kg                 CurrentWeight: 82.6 kg  Referring provider: Dr Marsa   CHIEF COMPLAINT:   Recurrent re-admission to hospital with hypoxemic respiratory failure      HISTORY OF PRESENT ILLNESS   This is a  68 y.o. female PMH of with medical history significant for CHF most recent TTE done June 2025 with poor acoustic windows, she is s/p pace maker insertion.  She also has dyslipidemia, depression, hypertension and chronic lung disease with centrilobular emphysema and COPD with chronic hypoxemic respiratory failure utilizing 4L/min [place]. She came in due to worsening respiratory distress with hemoptysis.  She denies confusion or AMS.    She had VBG done showing acidemic hypercarbia.  She reports having worsening respiratory symptoms and was worried about pneumonia so she came in to hospital.  She admits she recently started some new herbal medications and supplements.  I did discuss with her that she has mild transaminitis and she may need to hold the herbs for some time. I had warned her against over the counter mushroom supplements.    PAST MEDICAL HISTORY   Past Medical History:  Diagnosis Date   Anemia    Arthritis    Asthma    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/2016   Cancer (HCC) 2012   Right Lung CA   COPD (chronic obstructive pulmonary disease) (HCC)    Depression    Diabetes mellitus without complication (HCC)    Patient takes Janumet   Essential hypertension 05/26/2016   Heart failure (HCC) 2022   Hydropneumothorax 05/03/2020   Hypercholesteremia    Oxygen  dependent    2L at nite    PAD (peripheral artery disease) (HCC) 06/22/2016   Peripheral vascular disease (HCC)    Personal history of radiation therapy    Shortness of breath dyspnea    with exertion    Sialolithiasis    Sleep apnea     Wears dentures    full upper and lower     SURGICAL HISTORY   Past Surgical History:  Procedure Laterality Date   APPLICATION OF CELL SAVER Left 01/21/2024   Procedure: APPLICATION OF CELL SAVER;  Surgeon: Marea Selinda RAMAN, MD;  Location: [facility];  Service: Vascular;  Laterality: Left;   CESAREAN SECTION     x3   COLONOSCOPY N/A 04/03/2024   Procedure: COLONOSCOPY;  Surgeon: Jinny Carmine, MD;  Location: [facility];  Service: Endoscopy;  Laterality: N/A;   COLONOSCOPY WITH PROPOFOL  N/A 06/25/2015   Procedure: COLONOSCOPY WITH PROPOFOL ;  Surgeon: Carmine Jinny, MD;  Location: [facility];  Service: Endoscopy;  Laterality: N/A;   COLONOSCOPY WITH PROPOFOL  N/A 07/26/2020   Procedure: COLONOSCOPY WITH PROPOFOL ;  Surgeon: Jinny Carmine, MD;  Location: [facility];  Service: Endoscopy;  Laterality: N/A;   CYST REMOVAL LEG     and on shoulder    ENDARTERECTOMY FEMORAL Left 01/21/2024   Procedure: LEFT COMMON, SUPERFICIAL FEMORAL AND PROFUNDA FEMORIS ENDARTERECTOMY WITH BOVINE PERICARDIAL PATCH ANGIOPLASTY;  Surgeon: Marea Selinda RAMAN, MD;  Location: [facility];  Service: Vascular;  Laterality: Left;   ESOPHAGOGASTRODUODENOSCOPY (EGD) WITH PROPOFOL  N/A 07/26/2020   Procedure: ESOPHAGOGASTRODUODENOSCOPY (EGD) WITH PROPOFOL ;  Surgeon: Jinny Carmine, MD;  Location: [facility];  Service: Endoscopy;  Laterality: N/A;  Diabetic - oral meds   INSERTION OF ILIAC STENT Left 01/21/2024   Procedure: INSERTION, STENT, ARTERY, ILIAC;  Surgeon: Marea Selinda RAMAN, MD;  Location: [facility];  Service: Vascular;  Laterality: Left;  AND SFA STENTS   LOWER EXTREMITY ANGIOGRAPHY Left 09/29/2018   Procedure: LOWER EXTREMITY ANGIOGRAPHY;  Surgeon: Marea Selinda RAMAN, MD;  Location: [facility];  Service: Cardiovascular;  Laterality: Left;   LOWER EXTREMITY ANGIOGRAPHY Left 07/29/2023   Procedure: Lower Extremity Angiography;  Surgeon: Marea Selinda RAMAN, MD;  Location: [facility];  Service: Cardiovascular;   Laterality: Left;   LOWER EXTREMITY ANGIOGRAPHY Right 12/20/2023   Procedure: Lower Extremity Angiography;  Surgeon: Marea Selinda RAMAN, MD;  Location: [facility];  Service: Cardiovascular;  Laterality: Right;   LOWER EXTREMITY ANGIOGRAPHY Left 01/17/2024   Procedure: Lower Extremity Angiography;  Surgeon: Marea Selinda RAMAN, MD;  Location: [facility];  Service: Cardiovascular;  Laterality: Left;   LUNG BIOPSY  12/30/2011   has lung spots   PACEMAKER IMPLANT  07/14/2021   PACEMAKER LEADLESS INSERTION N/A 07/14/2021   Procedure: PACEMAKER LEADLESS INSERTION;  Surgeon: Ammon Blunt, MD;  Location: [facility];  Service: Cardiovascular;  Laterality: N/A;   PERIPHERAL VASCULAR CATHETERIZATION Left 06/01/2016   Procedure: Lower Extremity Angiography;  Surgeon: Selinda RAMAN Marea, MD;  Location: [facility];  Service: Cardiovascular;  Laterality: Left;   PERIPHERAL VASCULAR CATHETERIZATION N/A 06/01/2016   Procedure: Abdominal Aortogram w/Lower Extremity;  Surgeon: Selinda RAMAN Marea, MD;  Location: [facility];  Service: Cardiovascular;  Laterality: N/A;   PERIPHERAL VASCULAR CATHETERIZATION  06/01/2016   Procedure: Lower Extremity Intervention;  Surgeon: Selinda RAMAN Marea, MD;  Location: [facility];  Service: Cardiovascular;;   PERIPHERAL VASCULAR CATHETERIZATION Right 06/08/2016   Procedure: Lower Extremity Angiography;  Surgeon: Selinda RAMAN Marea, MD;  Location: [facility];  Service: Cardiovascular;  Laterality: Right;   PERIPHERAL VASCULAR CATHETERIZATION  06/08/2016   Procedure: Lower Extremity Intervention;  Surgeon: Selinda RAMAN Marea, MD;  Location: [facility];  Service: Cardiovascular;;   POLYPECTOMY  04/03/2024   Procedure: POLYPECTOMY, INTESTINE;  Surgeon: Jinny Carmine, MD;  Location: [facility];  Service: Endoscopy;;   SUBMANDIBULAR GLAND EXCISION Left 12/06/2020   Procedure: EXCISION SUBMANDIBULAR GLAND;  Surgeon: Herminio Miu, MD;   Location: [facility];  Service: ENT;  Laterality: Left;  needs to be first case Diabetic - diet controlled   TEMPORARY PACEMAKER N/A 07/11/2021   Procedure: TEMPORARY PACEMAKER;  Surgeon: Ammon Blunt, MD;  Location: [facility];  Service: Cardiovascular;  Laterality: N/A;     FAMILY HISTORY   Family History  Problem Relation Age of Onset   Diabetes Mother    Hypercholesterolemia Mother    Lung cancer Father    Diabetes Sister    Diabetes Sister    Hypertension Sister    Diabetes Maternal Grandmother    Diabetes Paternal Grandmother    Heart attack Brother    Coronary artery disease Brother    Vascular Disease Brother    Heart attack Brother    Breast cancer Neg Hx      SOCIAL HISTORY   Social History   Tobacco Use   Smoking status: Former    Current packs/day: 0.00    Average packs/day: 1 pack/day for 37.0 years (37.0 ttl pk-yrs)    Types: Cigarettes    Start date: 02/06/1973    Quit date: 02/06/2010  Years since quitting: 14.2   Smokeless tobacco: Former    Types: Snuff  Vaping Use   Vaping status: Never Used  Substance Use Topics   Alcohol use: Not Currently    Alcohol/week: 5.0 standard drinks of alcohol    Types: 5 Cans of beer per week    Comment: /h x of alcohol abuse -stopped 2012- now drinks 5 beer per week   Drug use: Not Currently    Types: Marijuana, Crack cocaine, Cocaine    Comment: hx of cocaine use- last use 2015; last use marijuana6/22/19,     MEDICATIONS    Home Medication:    Current Medication:  Current Facility-Administered Medications:    acetaminophen  (TYLENOL ) tablet 650 mg, 650 mg, Oral, Q6H PRN, 650 mg at 04/19/24 0803 **OR** acetaminophen  (TYLENOL ) suppository 650 mg, 650 mg, Rectal, Q6H PRN, Paudel, Keshab, MD   ALPRAZolam  (XANAX ) tablet 0.5 mg, 0.5 mg, Oral, BID PRN, Paudel, Keshab, MD, 0.5 mg at 04/19/24 0931   aspirin  EC tablet 81 mg, 81 mg, Oral, Daily, Paudel, Keshab, MD, 81 mg at 04/19/24  0931   atorvastatin  (LIPITOR) tablet 10 mg, 10 mg, Oral, Daily, Paudel, Keshab, MD, 10 mg at 04/19/24 0931   budesonide -glycopyrrolate -formoterol  (BREZTRI ) 160-9-4.8 MCG/ACT inhaler 2 puff, 2 puff, Inhalation, BID, Niels Kayla FALCON, RPH   clopidogrel  (PLAVIX ) tablet 75 mg, 75 mg, Oral, Daily, Paudel, Keshab, MD, 75 mg at 04/19/24 0931   cyclobenzaprine  (FLEXERIL ) tablet 10 mg, 10 mg, Oral, QHS PRN, Alexander, Natalie, DO   enoxaparin  (LOVENOX ) injection 40 mg, 40 mg, Subcutaneous, Q24H, Paudel, Keshab, MD, 40 mg at 04/19/24 0932   escitalopram  (LEXAPRO ) tablet 10 mg, 10 mg, Oral, Daily, Alexander, Natalie, DO, 10 mg at 04/19/24 0931   [START ON 04/20/2024] ferrous sulfate  tablet 325 mg, 325 mg, Oral, Q breakfast, Alexander, Natalie, DO   furosemide  (LASIX ) injection 20 mg, 20 mg, Intravenous, BID, Paudel, Keshab, MD, 20 mg at 04/19/24 0931   insulin  aspart (novoLOG ) injection 0-5 Units, 0-5 Units, Subcutaneous, QHS, Paudel, Keshab, MD, 2 Units at 04/18/24 2108   insulin  aspart (novoLOG ) injection 0-9 Units, 0-9 Units, Subcutaneous, TID WC, Greenwood, Howard F, RPH, 3 Units at 04/19/24 9068   insulin  aspart (novoLOG ) injection 3 Units, 3 Units, Subcutaneous, TID WC, Alexander, Natalie, DO   insulin  glargine (LANTUS ) injection 8 Units, 8 Units, Subcutaneous, QHS, Alexander, Natalie, DO   ipratropium-albuterol  (DUONEB) 0.5-2.5 (3) MG/3ML nebulizer solution 3 mL, 3 mL, Nebulization, Q6H PRN, Paudel, Keshab, MD   methylPREDNISolone  sodium succinate (SOLU-MEDROL ) 125 mg/2 mL injection 80 mg, 80 mg, Intravenous, Q12H, Paudel, Keshab, MD, 80 mg at 04/19/24 0931   metoprolol  succinate (TOPROL -XL) 24 hr tablet 50 mg, 50 mg, Oral, Daily, Paudel, Keshab, MD, 50 mg at 04/19/24 0931   ondansetron  (ZOFRAN ) tablet 4 mg, 4 mg, Oral, Q6H PRN **OR** ondansetron  (ZOFRAN ) injection 4 mg, 4 mg, Intravenous, Q6H PRN, Paudel, Keshab, MD   oxyCODONE -acetaminophen  (PERCOCET/ROXICET) 5-325 MG per tablet 1 tablet, 1 tablet,  Oral, Q6H PRN, Paudel, Keshab, MD, 1 tablet at 04/18/24 2008   pantoprazole  (PROTONIX ) EC tablet 40 mg, 40 mg, Oral, Daily, Paudel, Keshab, MD, 40 mg at 04/19/24 0931   polyethylene glycol (MIRALAX  / GLYCOLAX ) packet 17 g, 17 g, Oral, Daily PRN, Paudel, Keshab, MD   pregabalin  (LYRICA ) capsule 75 mg, 75 mg, Oral, BID, Alexander, Natalie, DO, 75 mg at 04/19/24 9068    ALLERGIES   Iodinated contrast media, Trelegy ellipta  [fluticasone -umeclidin-vilant], and Bactoshield chg [chlorhexidine  gluconate]     REVIEW  OF SYSTEMS    Review of Systems:  Gen:  Denies  fever, sweats, chills weigh loss  HEENT: Denies blurred vision, double vision, ear pain, eye pain, hearing loss, nose bleeds, sore throat Cardiac:  No dizziness, chest pain or heaviness, chest tightness,edema Resp:   reports dyspnea chronically  Gi: Denies swallowing difficulty, stomach pain, nausea or vomiting, diarrhea, constipation, bowel incontinence Gu:  Denies bladder incontinence, burning urine Ext:   Denies Joint pain, stiffness or swelling Skin: Denies  skin rash, easy bruising or bleeding or hives Endoc:  Denies polyuria, polydipsia , polyphagia or weight change Psych:   Denies depression, insomnia or hallucinations   Other:  All other systems negative   VS: BP 119/67 (BP Location: Left Arm)   Pulse 80   Temp 98.7 F (37.1 C)   Resp 16   Ht 4' 11 (1.499 m)   Wt 82.6 kg   SpO2 97%   BMI 36.78 kg/m      PHYSICAL EXAM    GENERAL:NAD, no fevers, chills, no weakness no fatigue HEAD: Normocephalic, atraumatic.  EYES: Pupils equal, round, reactive to light. Extraocular muscles intact. No scleral icterus.  MOUTH: Moist mucosal membrane. Dentition intact. No abscess noted.  EAR, NOSE, THROAT: Clear without exudates. No external lesions.  NECK: Supple. No thyromegaly. No nodules. No JVD.  PULMONARY: decreased breath sounds with mild rhonchi worse at bases bilaterally.  CARDIOVASCULAR: S1 and S2. Regular rate  and rhythm. No murmurs, rubs, or gallops. No edema. Pedal pulses 2+ bilaterally.  GASTROINTESTINAL: Soft, nontender, nondistended. No masses. Positive bowel sounds. No hepatosplenomegaly.  MUSCULOSKELETAL: No swelling, clubbing, or edema. Range of motion full in all extremities.  NEUROLOGIC: Cranial nerves II through XII are intact. No gross focal neurological deficits. Sensation intact. Reflexes intact.  SKIN: No ulceration, lesions, rashes, or cyanosis. Skin warm and dry. Turgor intact.  PSYCHIATRIC: Mood, affect within normal limits. The patient is awake, alert and oriented x 3. Insight, judgment intact.       IMAGING   Narrative & Impression  CLINICAL DATA:  COPD exacerbation.   EXAM: PORTABLE CHEST 1 VIEW   COMPARISON:  04/09/2024   FINDINGS: Cardiopericardial silhouette is at upper limits of normal for size. Lungs are hyperexpanded. Scattered areas of architectural distortion/scarring are noted bilaterally. Pleuroparenchymal opacity in the right base is mildly improved in the interval. No acute bony abnormality. Telemetry leads overlie the chest.   IMPRESSION: Mild interval improvement in right base pleuroparenchymal opacity likely a combination of airspace disease and pleural fluid.     Electronically Signed   By: Camellia Candle M.D.   On: 04/18/2024 06:43    ASSESSMENT/PLAN   Acute on chronic hypoxemic hypercapneic respiratory failure     Patient with AE-COPD - moderate     - responding well to current therapy with steroids and antibiotics    -mild rhonchi only on lung examination    -CRP for background of inflammation and to help guide steroid dosing   -continue Breztre Aerosphere BID   - reducing steroids to PO prednisone    Chronic diastolic CHF    - on diuresis and cardiac medications  DM 2    - continue glycemic control with ISS ac/hs   - continue home regimen   - diabetic cardiac diet   Physical deconditioning and atelectasis    - incentive  spirometry     - PT/OT           Thank you for allowing me to participate in the  care of this patient.   Patient/Family are satisfied with care plan and all questions have been answered.    Provider disclosure: Patient with at least one acute or chronic illness or injury that poses a threat to life or bodily function and is being managed actively during this encounter.  All of the below services have been performed independently by signing provider:  review of prior documentation from internal and or external health records.  Review of previous and current lab results.  Interview and comprehensive assessment during patient visit today. Review of current and previous chest radiographs/CT scans. Discussion of management and test interpretation with health care team and patient/family.   This document was prepared using Dragon voice recognition software and may include unintentional dictation errors.     Jaiveer Panas, M.D.  Division of Pulmonary & Critical Care Medicine

## 2024-04-20 DIAGNOSIS — J9612 Chronic respiratory failure with hypercapnia: Secondary | ICD-10-CM | POA: Diagnosis not present

## 2024-04-20 DIAGNOSIS — J9601 Acute respiratory failure with hypoxia: Secondary | ICD-10-CM | POA: Diagnosis not present

## 2024-04-20 LAB — GLUCOSE, CAPILLARY
Glucose-Capillary: 114 mg/dL — ABNORMAL HIGH (ref 70–99)
Glucose-Capillary: 292 mg/dL — ABNORMAL HIGH (ref 70–99)
Glucose-Capillary: 288 mg/dL — ABNORMAL HIGH (ref 70–99)
Glucose-Capillary: 396 mg/dL — ABNORMAL HIGH (ref 70–99)

## 2024-04-20 LAB — BLOOD GAS, VENOUS
Bicarbonate: 37.2 mmol/L — ABNORMAL HIGH (ref 20.0–28.0)
Patient temperature: 37 mmol/L — AB (ref 0.0–2.0)
pCO2, Ven: 91 mmHg (ref 44–60)
pH, Ven: 7.22 — ABNORMAL LOW (ref 7.25–7.43)
pO2, Ven: 37.2 mmol/L — AB (ref 32–45)

## 2024-04-20 LAB — HEMOGLOBIN A1C
Hgb A1c MFr Bld: 7.2 % — ABNORMAL HIGH (ref 4.8–5.6)
Mean Plasma Glucose: 160 mg/dL

## 2024-04-20 MED ORDER — METOPROLOL SUCCINATE ER 25 MG PO TB24
25.0000 mg | ORAL_TABLET | Freq: Every day | ORAL | Status: DC
  Administered 2024-04-20 – 2024-04-21 (×2): 25 mg via ORAL
  Filled 2024-04-20 (×2): qty 1

## 2024-04-20 MED ORDER — LOSARTAN POTASSIUM 25 MG PO TABS
12.5000 mg | ORAL_TABLET | Freq: Every day | ORAL | Status: DC
  Administered 2024-04-20 – 2024-04-21 (×2): 12.5 mg via ORAL
  Filled 2024-04-20 (×2): qty 1

## 2024-04-20 NOTE — Progress Notes (Signed)
 PROGRESS NOTE    Angela Jensen   FMW:[id number] DOB: 1955-11-10  DOA: 04/18/2024 Date of Service: 04/20/24 which is hospital day 2  PCP: Liana Fish, NP     Hospital course / significant events:   HPI: Angela Jensen is a pleasant 68 y.o. female with medical history significant for COPD on 4 L nasal cannula oxygen  at home, CHF s/p pacemaker, HTN, HLD, depression, sleep apnea who was recently admitted to [facility] and was discharged on 04/12/2024 after being treated for COPD exacerbation came in 04/18/24 for complaining of progressive shortness of breath started few hours prior to coming to ED.   09/02: to ED. 178/112, hypoxemic requiring BiPAP, x-ray did not show obvious pneumonia but there was mild pleural effusion on the right side, VBG showed hypercapnia around 90 and low pH. Patient was initiated on BiPAP, received 40 mg of IV Lasix  one-time, steroid, nebulization. Admitted to hospitalist w/ concern for COPD + CHF.  09/03: pulmonary consult. Of note, pt reports drinking a lot of an herbal tea recently which claims to detox the lungs question mild fluid overload / unknown effects from alternative unregulated treatments. To po steroids.  09/04: reduce metoprolol  d/t occasional bradycardia, resume losartan  at lower dose. Pt reports still SOB on exertion and requests another night in hospital.   Consultants:  Pulmonology   Procedures/Surgeries: none      ASSESSMENT & PLAN:   Acute on chronic hypoxemic and hypercarbic respiratory failure likely due to combination of COPD and possibly mild CHF exacerbation BiPAP wean as able / escalate as needed, has been on baseline O2 through the day but SOB on exertion above baseline symptoms Treat underlying cause(s) as below   COPD Steroids Nebs BiPAP at bedtime / prn  Pulmonary consult Advised there is no detox for the lungs and she should not buy OTC products claiming to do this   HFpEF in acute exacerbation Echocardiogram in June  2025 which showed ejection fraction 60 to 65%.  No need for repeat echocardiogram at this point. Diuresis held, euvolemic today  Strict I&O Fluid restriction  Type 2 diabetes continue her on  insulin  sliding scale and 10 units of Lantus  at bedtime. Hold oral hypoglycemic medication while she is in the hospital   HTN/HLD Resume her home medications   CAD aspirin /Plavix  and statin.  Anxiety depression Continue alprazolam  as needed      Class 2 obesity based on BMI: Body mass index is 36.78 kg/m.SABRA Significantly low or high BMI is associated with higher medical risk.  Underweight - under 18  overweight - 25 to 29 obese - 30 or more Class 1 obesity: BMI of 30.0 to 34 Class 2 obesity: BMI of 35.0 to 39 Class 3 obesity: BMI of 40.0 to 49 Super Morbid Obesity: BMI 50-59 Super-super Morbid Obesity: BMI 60+ Healthy nutrition and physical activity advised as adjunct to other disease management and risk reduction treatments    DVT prophylaxis: lovenox  IV fluids: no continuous IV fluids  Nutrition: cardiac/diabetic Central lines / other devices: none  Code Status: FULL CODE ACP documentation reviewed:  none on file in [facility]  TOC needs: [facility] Medical barriers to dispo: SOB/COPD tx,. Expected medical readiness for discharge tomorrowif on home O2              Subjective / Brief ROS:  Patient reports doing same today she is worried about SOB on exertion  Denies CP/SOB at rest  Pain controlled.  Denies new weakness.  Tolerating diet.  Reports no concerns w/ urination/defecation.   Family Communication: none at this time    Objective Findings:  Vitals:   04/20/24 0813 04/20/24 1129 04/20/24 1300 04/20/24 1612  BP: 129/74 98/65  114/71  Pulse: 70 73  72  Resp: 16 17  16   Temp: 97.9 F (36.6 C) 98.6 F (37 C)  98.5 F (36.9 C)  TempSrc:      SpO2: 97% 93% 93% 96%  Weight:      Height:        Intake/Output Summary (Last 24 hours) at 04/20/2024 1850 Last data  filed at 04/20/2024 1431 Gross per 24 hour  Intake 300 ml  Output 600 ml  Net -300 ml   Filed Weights   04/18/24 0617 04/19/24 0500 04/20/24 0500  Weight: 53.1 kg 82.6 kg 81.6 kg    Examination:  Physical Exam Constitutional:      General: She is not in acute distress. Cardiovascular:     Rate and Rhythm: Normal rate and regular rhythm.  Pulmonary:     Breath sounds: Decreased breath sounds present. No rhonchi.  Skin:    General: Skin is warm and dry.  Neurological:     Mental Status: She is alert and oriented to person, place, and time.  Psychiatric:        Mood and Affect: Mood normal.        Behavior: Behavior normal.          Scheduled Medications:   aspirin  EC  81 mg Oral Daily   atorvastatin   10 mg Oral Daily   budesonide -glycopyrrolate -formoterol   2 puff Inhalation BID   clopidogrel   75 mg Oral Daily   enoxaparin  (LOVENOX ) injection  40 mg Subcutaneous Q24H   escitalopram   10 mg Oral Daily   ferrous sulfate   325 mg Oral Q breakfast   insulin  aspart  0-5 Units Subcutaneous QHS   insulin  aspart  0-9 Units Subcutaneous TID WC   insulin  aspart  3 Units Subcutaneous TID WC   insulin  glargine  8 Units Subcutaneous QHS   losartan   12.5 mg Oral Daily   metoprolol  succinate  25 mg Oral Daily   pantoprazole   40 mg Oral Daily   predniSONE   50 mg Oral Q breakfast   pregabalin   75 mg Oral BID    Continuous Infusions:   PRN Medications:  acetaminophen  **OR** acetaminophen , ALPRAZolam , cyclobenzaprine , ipratropium-albuterol , ondansetron  **OR** ondansetron  (ZOFRAN ) IV, oxyCODONE -acetaminophen , polyethylene glycol  Antimicrobials from admission:  Anti-infectives (From admission, onward)    None           Data Reviewed:  I have personally reviewed the following...  CBC: Recent Labs  Lab 04/18/24 0622 04/19/24 0413  WBC 9.6 8.1  NEUTROABS 6.5  --   HGB 11.0* 10.8*  HCT 36.5 35.0*  MCV 94.8 90.2  PLT 195 263   Basic Metabolic Panel: Recent Labs   Lab 04/18/24 0622 04/19/24 0413  NA 141 140  K 4.3 4.4  CL 101 91*  CO2 31 32  GLUCOSE 190* 290*  BUN 16 38*  CREATININE 0.84 0.84  CALCIUM  9.4 9.1   GFR: Estimated Creatinine Clearance: 59.3 mL/min (by C-G formula based on SCr of 0.84 mg/dL). Liver Function Tests: Recent Labs  Lab 04/18/24 0622  AST 45*  ALT 38  ALKPHOS 68  BILITOT 0.6  PROT 6.6  ALBUMIN 3.5   No results for input(s): LIPASE, AMYLASE in the last 168 hours. No results for input(s): AMMONIA in the last 168 hours. Coagulation Profile:  No results for input(s): INR, PROTIME in the last 168 hours. Cardiac Enzymes: No results for input(s): CKTOTAL, CKMB, CKMBINDEX, TROPONINI in the last 168 hours. BNP (last 3 results) No results for input(s): PROBNP in the last 8760 hours. HbA1C: Recent Labs    04/18/24 1823  HGBA1C 7.2*   CBG: Recent Labs  Lab 04/19/24 1655 04/19/24 2056 04/20/24 0816 04/20/24 1149 04/20/24 1614  GLUCAP 264* 195* 114* 292* 288*   Lipid Profile: No results for input(s): CHOL, HDL, LDLCALC, TRIG, CHOLHDL, LDLDIRECT in the last 72 hours. Thyroid  Function Tests: No results for input(s): TSH, T4TOTAL, FREET4, T3FREE, THYROIDAB in the last 72 hours. Anemia Panel: No results for input(s): VITAMINB12, FOLATE, FERRITIN, TIBC, IRON , RETICCTPCT in the last 72 hours. Most Recent Urinalysis On File:     Component Value Date/Time   COLORURINE STRAW (A) 04/17/2023 0930   APPEARANCEUR CLEAR (A) 04/17/2023 0930   APPEARANCEUR Clear 08/06/2022 1541   LABSPEC 1.014 04/17/2023 0930   PHURINE 6.0 04/17/2023 0930   GLUCOSEU NEGATIVE 04/17/2023 0930   HGBUR MODERATE (A) 04/17/2023 0930   BILIRUBINUR NEGATIVE 04/17/2023 0930   BILIRUBINUR Negative 08/06/2022 1541   KETONESUR NEGATIVE 04/17/2023 0930   PROTEINUR NEGATIVE 04/17/2023 0930   UROBILINOGEN 0.2 04/22/2021 0959   NITRITE NEGATIVE 04/17/2023 0930   LEUKOCYTESUR NEGATIVE  04/17/2023 0930   Sepsis Labs: @LABRCNTIP (procalcitonin:4,lacticidven:4) Microbiology: Recent Results (from the past 240 hours)  Resp panel by RT-PCR (RSV, Flu A&B, Covid) Anterior Nasal Swab     Status: None   Collection Time: 04/18/24  6:22 AM   Specimen: Anterior Nasal Swab  Result Value Ref Range Status   SARS Coronavirus 2 by RT PCR NEGATIVE NEGATIVE Final    Comment: (NOTE) SARS-CoV-2 target nucleic acids are NOT DETECTED.  The SARS-CoV-2 RNA is generally detectable in upper respiratory specimens during the acute phase of infection. The lowest concentration of SARS-CoV-2 viral copies this assay can detect is 138 copies/mL. A negative result does not preclude SARS-Cov-2 infection and should not be used as the sole basis for treatment or other patient management decisions. A negative result may occur with  improper specimen collection/handling, submission of specimen other than nasopharyngeal swab, presence of viral mutation(s) within the areas targeted by this assay, and inadequate number of viral copies(<138 copies/mL). A negative result must be combined with clinical observations, patient history, and epidemiological information. The expected result is Negative.  Fact Sheet for Patients:  BloggerCourse.com  Fact Sheet for Healthcare Providers:  SeriousBroker.it  This test is no t yet approved or cleared by the [place]  FDA and  has been authorized for detection and/or diagnosis of SARS-CoV-2 by FDA under an Emergency Use Authorization (EUA). This EUA will remain  in effect (meaning this test can be used) for the duration of the COVID-19 declaration under Section 564(b)(1) of the Act, 21 U.S.C.section 360bbb-3(b)(1), unless the authorization is terminated  or revoked sooner.       Influenza A by PCR NEGATIVE NEGATIVE Final   Influenza B by PCR NEGATIVE NEGATIVE Final    Comment: (NOTE) The Xpert Xpress  SARS-CoV-2/FLU/RSV plus assay is intended as an aid in the diagnosis of influenza from Nasopharyngeal swab specimens and should not be used as a sole basis for treatment. Nasal washings and aspirates are unacceptable for Xpert Xpress SARS-CoV-2/FLU/RSV testing.  Fact Sheet for Patients: BloggerCourse.com  Fact Sheet for Healthcare Providers: SeriousBroker.it  This test is not yet approved or cleared by the [place]  FDA and has been authorized for  detection and/or diagnosis of SARS-CoV-2 by FDA under an Emergency Use Authorization (EUA). This EUA will remain in effect (meaning this test can be used) for the duration of the COVID-19 declaration under Section 564(b)(1) of the Act, 21 U.S.C. section 360bbb-3(b)(1), unless the authorization is terminated or revoked.     Resp Syncytial Virus by PCR NEGATIVE NEGATIVE Final    Comment: (NOTE) Fact Sheet for Patients: BloggerCourse.com  Fact Sheet for Healthcare Providers: SeriousBroker.it  This test is not yet approved or cleared by the [place]  FDA and has been authorized for detection and/or diagnosis of SARS-CoV-2 by FDA under an Emergency Use Authorization (EUA). This EUA will remain in effect (meaning this test can be used) for the duration of the COVID-19 declaration under Section 564(b)(1) of the Act, 21 U.S.C. section 360bbb-3(b)(1), unless the authorization is terminated or revoked.  Performed at [facility], [address] [place], [place] [address]   Respiratory (~20 pathogens) panel by PCR     Status: None   Collection Time: 04/19/24  3:10 PM   Specimen: Nasopharyngeal Swab; Respiratory  Result Value Ref Range Status   Adenovirus NOT DETECTED NOT DETECTED Final   Coronavirus 229E NOT DETECTED NOT DETECTED Final    Comment: (NOTE) The Coronavirus on the Respiratory Panel, DOES NOT test for the novel   Coronavirus (2019 nCoV)    Coronavirus HKU1 NOT DETECTED NOT DETECTED Final   Coronavirus NL63 NOT DETECTED NOT DETECTED Final   Coronavirus OC43 NOT DETECTED NOT DETECTED Final   Metapneumovirus NOT DETECTED NOT DETECTED Final   Rhinovirus / Enterovirus NOT DETECTED NOT DETECTED Final   Influenza A NOT DETECTED NOT DETECTED Final   Influenza B NOT DETECTED NOT DETECTED Final   Parainfluenza Virus 1 NOT DETECTED NOT DETECTED Final   Parainfluenza Virus 2 NOT DETECTED NOT DETECTED Final   Parainfluenza Virus 3 NOT DETECTED NOT DETECTED Final   Parainfluenza Virus 4 NOT DETECTED NOT DETECTED Final   Respiratory Syncytial Virus NOT DETECTED NOT DETECTED Final   Bordetella pertussis NOT DETECTED NOT DETECTED Final   Bordetella Parapertussis NOT DETECTED NOT DETECTED Final   Chlamydophila pneumoniae NOT DETECTED NOT DETECTED Final   Mycoplasma pneumoniae NOT DETECTED NOT DETECTED Final    Comment: Performed at [facility], [address], [place], [place] [address]      Radiology Studies last 3 days: [facility] Chest Port 1 View Result Date: 04/18/2024 CLINICAL DATA:  COPD exacerbation. EXAM: PORTABLE CHEST 1 VIEW COMPARISON:  04/09/2024 FINDINGS: Cardiopericardial silhouette is at upper limits of normal for size. Lungs are hyperexpanded. Scattered areas of architectural distortion/scarring are noted bilaterally. Pleuroparenchymal opacity in the right base is mildly improved in the interval. No acute bony abnormality. Telemetry leads overlie the chest. IMPRESSION: Mild interval improvement in right base pleuroparenchymal opacity likely a combination of airspace disease and pleural fluid. Electronically Signed   By: Camellia Candle M.D.   On: 04/18/2024 06:43         Josilynn Losh, DO [facility] 04/20/2024, 6:50 PM    Dictation software may have been used to generate the above note. Typos may occur and escape review in typed/dictated notes. Please contact Dr Marsa  directly for clarity if needed.  Staff may message me via secure chat in Epic  but this may not receive an immediate response,  please page me for urgent matters!  If 7PM-7AM, please contact night coverage [email or web address]

## 2024-04-20 NOTE — Progress Notes (Signed)
 PULMONOLOGY         Date: 04/20/2024,   MRN# [id number] Angela Jensen Nov 22, 1955     AdmissionWeight: 53.1 kg                 CurrentWeight: 81.6 kg  Referring provider: Dr Marsa   CHIEF COMPLAINT:   Recurrent re-admission to hospital with hypoxemic respiratory failure      HISTORY OF PRESENT ILLNESS   This is a  68 y.o. female PMH of with medical history significant for CHF most recent TTE done June 2025 with poor acoustic windows, she is s/p pace maker insertion.  She also has dyslipidemia, depression, hypertension and chronic lung disease with centrilobular emphysema and COPD with chronic hypoxemic respiratory failure utilizing 4L/min [place]. She came in due to worsening respiratory distress with hemoptysis.  She denies confusion or AMS.    She had VBG done showing acidemic hypercarbia.  She reports having worsening respiratory symptoms and was worried about pneumonia so she came in to hospital.  She admits she recently started some new herbal medications and supplements.  I did discuss with her that she has mild transaminitis and she may need to hold the herbs for some time. I had warned her against over the counter mushroom supplements.    PAST MEDICAL HISTORY   Past Medical History:  Diagnosis Date   Anemia    Arthritis    Asthma    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/2016   Cancer (HCC) 2012   Right Lung CA   COPD (chronic obstructive pulmonary disease) (HCC)    Depression    Diabetes mellitus without complication (HCC)    Patient takes Janumet   Essential hypertension 05/26/2016   Heart failure (HCC) 2022   Hydropneumothorax 05/03/2020   Hypercholesteremia    Oxygen  dependent    2L at nite    PAD (peripheral artery disease) (HCC) 06/22/2016   Peripheral vascular disease (HCC)    Personal history of radiation therapy    Shortness of breath dyspnea    with exertion    Sialolithiasis    Sleep apnea     Wears dentures    full upper and lower     SURGICAL HISTORY   Past Surgical History:  Procedure Laterality Date   APPLICATION OF CELL SAVER Left 01/21/2024   Procedure: APPLICATION OF CELL SAVER;  Surgeon: Marea Selinda RAMAN, MD;  Location: [facility];  Service: Vascular;  Laterality: Left;   CESAREAN SECTION     x3   COLONOSCOPY N/A 04/03/2024   Procedure: COLONOSCOPY;  Surgeon: Jinny Carmine, MD;  Location: [facility];  Service: Endoscopy;  Laterality: N/A;   COLONOSCOPY WITH PROPOFOL  N/A 06/25/2015   Procedure: COLONOSCOPY WITH PROPOFOL ;  Surgeon: Carmine Jinny, MD;  Location: [facility];  Service: Endoscopy;  Laterality: N/A;   COLONOSCOPY WITH PROPOFOL  N/A 07/26/2020   Procedure: COLONOSCOPY WITH PROPOFOL ;  Surgeon: Jinny Carmine, MD;  Location: [facility];  Service: Endoscopy;  Laterality: N/A;   CYST REMOVAL LEG     and on shoulder    ENDARTERECTOMY FEMORAL Left 01/21/2024   Procedure: LEFT COMMON, SUPERFICIAL FEMORAL AND PROFUNDA FEMORIS ENDARTERECTOMY WITH BOVINE PERICARDIAL PATCH ANGIOPLASTY;  Surgeon: Marea Selinda RAMAN, MD;  Location: [facility];  Service: Vascular;  Laterality: Left;   ESOPHAGOGASTRODUODENOSCOPY (EGD) WITH PROPOFOL  N/A 07/26/2020   Procedure: ESOPHAGOGASTRODUODENOSCOPY (EGD) WITH PROPOFOL ;  Surgeon: Jinny Carmine, MD;  Location: [facility];  Service: Endoscopy;  Laterality: N/A;  Diabetic - oral meds   INSERTION OF ILIAC STENT Left 01/21/2024   Procedure: INSERTION, STENT, ARTERY, ILIAC;  Surgeon: Marea Selinda RAMAN, MD;  Location: [facility];  Service: Vascular;  Laterality: Left;  AND SFA STENTS   LOWER EXTREMITY ANGIOGRAPHY Left 09/29/2018   Procedure: LOWER EXTREMITY ANGIOGRAPHY;  Surgeon: Marea Selinda RAMAN, MD;  Location: [facility];  Service: Cardiovascular;  Laterality: Left;   LOWER EXTREMITY ANGIOGRAPHY Left 07/29/2023   Procedure: Lower Extremity Angiography;  Surgeon: Marea Selinda RAMAN, MD;  Location: [facility];  Service: Cardiovascular;   Laterality: Left;   LOWER EXTREMITY ANGIOGRAPHY Right 12/20/2023   Procedure: Lower Extremity Angiography;  Surgeon: Marea Selinda RAMAN, MD;  Location: [facility];  Service: Cardiovascular;  Laterality: Right;   LOWER EXTREMITY ANGIOGRAPHY Left 01/17/2024   Procedure: Lower Extremity Angiography;  Surgeon: Marea Selinda RAMAN, MD;  Location: [facility];  Service: Cardiovascular;  Laterality: Left;   LUNG BIOPSY  12/30/2011   has lung spots   PACEMAKER IMPLANT  07/14/2021   PACEMAKER LEADLESS INSERTION N/A 07/14/2021   Procedure: PACEMAKER LEADLESS INSERTION;  Surgeon: Ammon Blunt, MD;  Location: [facility];  Service: Cardiovascular;  Laterality: N/A;   PERIPHERAL VASCULAR CATHETERIZATION Left 06/01/2016   Procedure: Lower Extremity Angiography;  Surgeon: Selinda RAMAN Marea, MD;  Location: [facility];  Service: Cardiovascular;  Laterality: Left;   PERIPHERAL VASCULAR CATHETERIZATION N/A 06/01/2016   Procedure: Abdominal Aortogram w/Lower Extremity;  Surgeon: Selinda RAMAN Marea, MD;  Location: [facility];  Service: Cardiovascular;  Laterality: N/A;   PERIPHERAL VASCULAR CATHETERIZATION  06/01/2016   Procedure: Lower Extremity Intervention;  Surgeon: Selinda RAMAN Marea, MD;  Location: [facility];  Service: Cardiovascular;;   PERIPHERAL VASCULAR CATHETERIZATION Right 06/08/2016   Procedure: Lower Extremity Angiography;  Surgeon: Selinda RAMAN Marea, MD;  Location: [facility];  Service: Cardiovascular;  Laterality: Right;   PERIPHERAL VASCULAR CATHETERIZATION  06/08/2016   Procedure: Lower Extremity Intervention;  Surgeon: Selinda RAMAN Marea, MD;  Location: [facility];  Service: Cardiovascular;;   POLYPECTOMY  04/03/2024   Procedure: POLYPECTOMY, INTESTINE;  Surgeon: Jinny Carmine, MD;  Location: [facility];  Service: Endoscopy;;   SUBMANDIBULAR GLAND EXCISION Left 12/06/2020   Procedure: EXCISION SUBMANDIBULAR GLAND;  Surgeon: Herminio Miu, MD;   Location: [facility];  Service: ENT;  Laterality: Left;  needs to be first case Diabetic - diet controlled   TEMPORARY PACEMAKER N/A 07/11/2021   Procedure: TEMPORARY PACEMAKER;  Surgeon: Ammon Blunt, MD;  Location: [facility];  Service: Cardiovascular;  Laterality: N/A;     FAMILY HISTORY   Family History  Problem Relation Age of Onset   Diabetes Mother    Hypercholesterolemia Mother    Lung cancer Father    Diabetes Sister    Diabetes Sister    Hypertension Sister    Diabetes Maternal Grandmother    Diabetes Paternal Grandmother    Heart attack Brother    Coronary artery disease Brother    Vascular Disease Brother    Heart attack Brother    Breast cancer Neg Hx      SOCIAL HISTORY   Social History   Tobacco Use   Smoking status: Former    Current packs/day: 0.00    Average packs/day: 1 pack/day for 37.0 years (37.0 ttl pk-yrs)    Types: Cigarettes    Start date: 02/06/1973    Quit date: 02/06/2010  Years since quitting: 14.2   Smokeless tobacco: Former    Types: Snuff  Vaping Use   Vaping status: Never Used  Substance Use Topics   Alcohol use: Not Currently    Alcohol/week: 5.0 standard drinks of alcohol    Types: 5 Cans of beer per week    Comment: /h x of alcohol abuse -stopped 2012- now drinks 5 beer per week   Drug use: Not Currently    Types: Marijuana, Crack cocaine, Cocaine    Comment: hx of cocaine use- last use 2015; last use marijuana6/22/19,     MEDICATIONS    Home Medication:    Current Medication:  Current Facility-Administered Medications:    acetaminophen  (TYLENOL ) tablet 650 mg, 650 mg, Oral, Q6H PRN, 650 mg at 04/19/24 0803 **OR** acetaminophen  (TYLENOL ) suppository 650 mg, 650 mg, Rectal, Q6H PRN, Paudel, Keshab, MD   ALPRAZolam  (XANAX ) tablet 0.5 mg, 0.5 mg, Oral, BID PRN, Paudel, Keshab, MD, 0.5 mg at 04/19/24 2049   aspirin  EC tablet 81 mg, 81 mg, Oral, Daily, Paudel, Keshab, MD, 81 mg at 04/19/24  0931   atorvastatin  (LIPITOR) tablet 10 mg, 10 mg, Oral, Daily, Paudel, Keshab, MD, 10 mg at 04/19/24 0931   budesonide -glycopyrrolate -formoterol  (BREZTRI ) 160-9-4.8 MCG/ACT inhaler 2 puff, 2 puff, Inhalation, BID, Niels Kayla FALCON, RPH, 2 puff at 04/19/24 2048   clopidogrel  (PLAVIX ) tablet 75 mg, 75 mg, Oral, Daily, Paudel, Keshab, MD, 75 mg at 04/19/24 0931   cyclobenzaprine  (FLEXERIL ) tablet 10 mg, 10 mg, Oral, QHS PRN, Alexander, Natalie, DO, 10 mg at 04/19/24 2048   enoxaparin  (LOVENOX ) injection 40 mg, 40 mg, Subcutaneous, Q24H, Paudel, Keshab, MD, 40 mg at 04/19/24 0932   escitalopram  (LEXAPRO ) tablet 10 mg, 10 mg, Oral, Daily, Alexander, Natalie, DO, 10 mg at 04/19/24 9068   ferrous sulfate  tablet 325 mg, 325 mg, Oral, Q breakfast, Marsa Edelman, DO   insulin  aspart (novoLOG ) injection 0-5 Units, 0-5 Units, Subcutaneous, QHS, Paudel, Keshab, MD, 2 Units at 04/18/24 2108   insulin  aspart (novoLOG ) injection 0-9 Units, 0-9 Units, Subcutaneous, TID WC, Greenwood, Howard F, RPH, 5 Units at 04/19/24 1833   insulin  aspart (novoLOG ) injection 3 Units, 3 Units, Subcutaneous, TID WC, Alexander, Natalie, DO, 3 Units at 04/19/24 1833   insulin  glargine (LANTUS ) injection 8 Units, 8 Units, Subcutaneous, QHS, Marsa Edelman, DO, 8 Units at 04/19/24 2101   ipratropium-albuterol  (DUONEB) 0.5-2.5 (3) MG/3ML nebulizer solution 3 mL, 3 mL, Nebulization, Q6H PRN, Paudel, Keshab, MD   metoprolol  succinate (TOPROL -XL) 24 hr tablet 50 mg, 50 mg, Oral, Daily, Paudel, Keshab, MD, 50 mg at 04/19/24 0931   ondansetron  (ZOFRAN ) tablet 4 mg, 4 mg, Oral, Q6H PRN **OR** ondansetron  (ZOFRAN ) injection 4 mg, 4 mg, Intravenous, Q6H PRN, Paudel, Nena, MD   oxyCODONE -acetaminophen  (PERCOCET/ROXICET) 5-325 MG per tablet 1 tablet, 1 tablet, Oral, Q6H PRN, Paudel, Keshab, MD, 1 tablet at 04/19/24 1315   pantoprazole  (PROTONIX ) EC tablet 40 mg, 40 mg, Oral, Daily, Paudel, Keshab, MD, 40 mg at 04/19/24 0931    polyethylene glycol (MIRALAX  / GLYCOLAX ) packet 17 g, 17 g, Oral, Daily PRN, Paudel, Keshab, MD   predniSONE  (DELTASONE ) tablet 50 mg, 50 mg, Oral, Q breakfast, Meila Berke, MD   pregabalin  (LYRICA ) capsule 75 mg, 75 mg, Oral, BID, Alexander, Natalie, DO, 75 mg at 04/19/24 2049    ALLERGIES   Iodinated contrast media, Trelegy ellipta  [fluticasone -umeclidin-vilant], and Bactoshield chg [chlorhexidine  gluconate]     REVIEW OF SYSTEMS    Review of Systems:  Gen:  Denies  fever, sweats, chills weigh loss  HEENT: Denies blurred vision, double vision, ear pain, eye pain, hearing loss, nose bleeds, sore throat Cardiac:  No dizziness, chest pain or heaviness, chest tightness,edema Resp:   reports dyspnea chronically  Gi: Denies swallowing difficulty, stomach pain, nausea or vomiting, diarrhea, constipation, bowel incontinence Gu:  Denies bladder incontinence, burning urine Ext:   Denies Joint pain, stiffness or swelling Skin: Denies  skin rash, easy bruising or bleeding or hives Endoc:  Denies polyuria, polydipsia , polyphagia or weight change Psych:   Denies depression, insomnia or hallucinations   Other:  All other systems negative   VS: BP (!) 164/87 (BP Location: Left Arm)   Pulse 64   Temp 97.6 F (36.4 C)   Resp 20   Ht 4' 11 (1.499 m)   Wt 81.6 kg   SpO2 99%   BMI 36.33 kg/m      PHYSICAL EXAM    GENERAL:NAD, no fevers, chills, no weakness no fatigue HEAD: Normocephalic, atraumatic.  EYES: Pupils equal, round, reactive to light. Extraocular muscles intact. No scleral icterus.  MOUTH: Moist mucosal membrane. Dentition intact. No abscess noted.  EAR, NOSE, THROAT: Clear without exudates. No external lesions.  NECK: Supple. No thyromegaly. No nodules. No JVD.  PULMONARY: decreased breath sounds with mild rhonchi worse at bases bilaterally.  CARDIOVASCULAR: S1 and S2. Regular rate and rhythm. No murmurs, rubs, or gallops. No edema. Pedal pulses 2+ bilaterally.   GASTROINTESTINAL: Soft, nontender, nondistended. No masses. Positive bowel sounds. No hepatosplenomegaly.  MUSCULOSKELETAL: No swelling, clubbing, or edema. Range of motion full in all extremities.  NEUROLOGIC: Cranial nerves II through XII are intact. No gross focal neurological deficits. Sensation intact. Reflexes intact.  SKIN: No ulceration, lesions, rashes, or cyanosis. Skin warm and dry. Turgor intact.  PSYCHIATRIC: Mood, affect within normal limits. The patient is awake, alert and oriented x 3. Insight, judgment intact.       IMAGING   Narrative & Impression  CLINICAL DATA:  COPD exacerbation.   EXAM: PORTABLE CHEST 1 VIEW   COMPARISON:  04/09/2024   FINDINGS: Cardiopericardial silhouette is at upper limits of normal for size. Lungs are hyperexpanded. Scattered areas of architectural distortion/scarring are noted bilaterally. Pleuroparenchymal opacity in the right base is mildly improved in the interval. No acute bony abnormality. Telemetry leads overlie the chest.   IMPRESSION: Mild interval improvement in right base pleuroparenchymal opacity likely a combination of airspace disease and pleural fluid.     Electronically Signed   By: Camellia Candle M.D.   On: 04/18/2024 06:43    ASSESSMENT/PLAN   Acute on chronic hypoxemic hypercapneic respiratory failure     Patient with AE-COPD - moderate     - responding well to current therapy with steroids and antibiotics    -mild rhonchi only on lung examination    -CRP for background of inflammation and to help guide steroid dosing   -continue Breztre Aerosphere BID   - reducing steroids to PO prednisone    Chronic diastolic CHF    - on diuresis and cardiac medications  DM 2    - continue glycemic control with ISS ac/hs   - continue home regimen   - diabetic cardiac diet   Physical deconditioning and atelectasis    - incentive spirometry     - PT/OT           Thank you for allowing me to participate in  the care of this patient.   Patient/Family are satisfied with  care plan and all questions have been answered.    Provider disclosure: Patient with at least one acute or chronic illness or injury that poses a threat to life or bodily function and is being managed actively during this encounter.  All of the below services have been performed independently by signing provider:  review of prior documentation from internal and or external health records.  Review of previous and current lab results.  Interview and comprehensive assessment during patient visit today. Review of current and previous chest radiographs/CT scans. Discussion of management and test interpretation with health care team and patient/family.   This document was prepared using Dragon voice recognition software and may include unintentional dictation errors.     Keslee Harrington, M.D.  Division of Pulmonary & Critical Care Medicine

## 2024-04-20 NOTE — Plan of Care (Signed)
  Problem: Clinical Measurements: Goal: Diagnostic test results will improve Outcome: Progressing   Problem: Clinical Measurements: Goal: Respiratory complications will improve Outcome: Progressing   Problem: Clinical Measurements: Goal: Cardiovascular complication will be avoided Outcome: Progressing   Problem: Activity: Goal: Risk for activity intolerance will decrease Outcome: Progressing   Problem: Coping: Goal: Level of anxiety will decrease Outcome: Progressing

## 2024-04-20 NOTE — Progress Notes (Signed)
   04/20/24 1300  Oxygen  Therapy  SpO2 93 %  O2 Device Nasal Cannula  O2 Flow Rate (L/min) 4 L/min  Mobility  Activity Ambulated with assistance  Level of Assistance Contact guard assist, steadying assist  Assistive Device Front wheel walker  Distance Ambulated (ft) 185 ft  Range of Motion/Exercises Active Assistive;All extremities  Activity Response Tolerated well  Mobility Referral Yes  Mobility visit 1 Mobility  Mobility Specialist Start Time (ACUTE ONLY) 1044  Mobility Specialist Stop Time (ACUTE ONLY) 1114  Mobility Specialist Time Calculation (min) (ACUTE ONLY) 30 min   Mobility Specialist - Progress Note Upon arrival pt was in bed O2 @ 4 LPM. STS independent O2 levels 86. Pt recovered quickly with deep breaths. Ambulation once recovered and O2 levels fluctuated between 90- 93. Once back in the room Dr. Arrived and needs in arm reach.  Clem Rodes Mobility Specialist 04/20/24, 1:36 PM

## 2024-04-21 ENCOUNTER — Inpatient Hospital Stay

## 2024-04-21 ENCOUNTER — Other Ambulatory Visit: Payer: Self-pay

## 2024-04-21 DIAGNOSIS — J9601 Acute respiratory failure with hypoxia: Secondary | ICD-10-CM | POA: Diagnosis not present

## 2024-04-21 DIAGNOSIS — J9612 Chronic respiratory failure with hypercapnia: Secondary | ICD-10-CM | POA: Diagnosis not present

## 2024-04-21 LAB — BLOOD GAS, VENOUS
Acid-Base Excess: 4 mmol/L — ABNORMAL HIGH (ref 0.0–2.0)
Bicarbonate: 32.7 mmol/L — ABNORMAL HIGH (ref 20.0–28.0)
O2 Saturation: 59.6 %
Patient temperature: 37
pCO2, Ven: 68 mmHg — ABNORMAL HIGH (ref 44–60)
pH, Ven: 7.29 (ref 7.25–7.43)
pO2, Ven: 40 mmHg (ref 32–45)

## 2024-04-21 LAB — GLUCOSE, CAPILLARY
Glucose-Capillary: 181 mg/dL — ABNORMAL HIGH (ref 70–99)
Glucose-Capillary: 282 mg/dL — ABNORMAL HIGH (ref 70–99)

## 2024-04-21 MED ORDER — PREDNISONE 50 MG PO TABS: 25.0000 mg | ORAL_TABLET | Freq: Every day | ORAL | Status: DC

## 2024-04-21 MED ORDER — PREDNISONE 10 MG PO TABS: 15.0000 mg | ORAL_TABLET | Freq: Every day | ORAL | Status: DC

## 2024-04-21 MED ORDER — PREDNISONE 20 MG PO TABS: 40.0000 mg | ORAL_TABLET | Freq: Every day | ORAL | Status: DC

## 2024-04-21 MED ORDER — PREDNISONE 20 MG PO TABS: 20.0000 mg | ORAL_TABLET | Freq: Every day | ORAL | Status: DC

## 2024-04-21 MED ORDER — PREDNISONE 20 MG PO TABS: 30.0000 mg | ORAL_TABLET | Freq: Every day | ORAL | Status: DC

## 2024-04-21 MED ORDER — PREDNISONE 10 MG PO TABS: 5.0000 mg | ORAL_TABLET | Freq: Every day | ORAL | Status: DC

## 2024-04-21 MED ORDER — INSULIN ASPART 100 UNIT/ML IJ SOLN
4.0000 [IU] | Freq: Three times a day (TID) | INTRAMUSCULAR | Status: DC
  Administered 2024-04-21 (×2): 4 [IU] via SUBCUTANEOUS
  Filled 2024-04-21: qty 1

## 2024-04-21 MED ORDER — PREDNISONE 5 MG PO TABS
ORAL_TABLET | ORAL | 0 refills | Status: AC
  Filled 2024-04-21: qty 58, 30d supply, fill #0

## 2024-04-21 MED ORDER — PREDNISONE 50 MG PO TABS
35.0000 mg | ORAL_TABLET | Freq: Every day | ORAL | Status: DC
Start: 2024-04-23 — End: 2024-04-21

## 2024-04-21 MED ORDER — BUDESON-GLYCOPYRROL-FORMOTEROL 160-9-4.8 MCG/ACT IN AERO
2.0000 | INHALATION_SPRAY | Freq: Two times a day (BID) | RESPIRATORY_TRACT | 1 refills | Status: DC
  Filled 2024-04-21: qty 10.7, 30d supply, fill #0

## 2024-04-21 MED ORDER — PREDNISONE 10 MG PO TABS: 10.0000 mg | ORAL_TABLET | Freq: Every day | ORAL | Status: DC

## 2024-04-21 MED ORDER — METOPROLOL SUCCINATE ER 50 MG PO TB24: 25.0000 mg | ORAL_TABLET | Freq: Every day | ORAL | Status: DC

## 2024-04-21 NOTE — Progress Notes (Signed)
 PT Cancellation Note  Patient Details Name: Angela Jensen MRN: [id number] DOB: [birth year]   Cancelled Treatment:    Reason Eval/Treat Not Completed: Other (comment). Pt received ambulating with mobility specialist. Pt politely declining PT treatment this date stating she is discharging home today.    [name]. Fairly IV, PT, DPT Physical Therapist- [place]  [facility] 04/21/2024, 10:24 AM

## 2024-04-21 NOTE — Progress Notes (Signed)
 PULMONOLOGY         Date: 04/21/2024,   MRN# [id number] Angela Jensen 1956/08/04     AdmissionWeight: 53.1 kg                 CurrentWeight: 82.5 kg  Referring provider: Dr Marsa   CHIEF COMPLAINT:   Recurrent re-admission to hospital with hypoxemic respiratory failure      HISTORY OF PRESENT ILLNESS   This is a  68 y.o. female PMH of with medical history significant for CHF most recent TTE done June 2025 with poor acoustic windows, she is s/p pace maker insertion.  She also has dyslipidemia, depression, hypertension and chronic lung disease with centrilobular emphysema and COPD with chronic hypoxemic respiratory failure utilizing 4L/min . She came in due to worsening respiratory distress with hemoptysis.  She denies confusion or AMS.    She had VBG done showing acidemic hypercarbia.  She reports having worsening respiratory symptoms and was worried about pneumonia so she came in to hospital.  She admits she recently started some new herbal medications and supplements.  I did discuss with her that she has mild transaminitis and she may need to hold the herbs for some time. I had warned her against over the counter mushroom supplements.   04/21/24- patient laying in bed on BIPAP but saturating 100%.  She no longer complains of blood streaked expectorate.  I've ordered VBG and cxr to be repeated today.  She's close to baseline and I think she may be ok for dc planning with outpatient follow up.  She's on prednisone  40mg  po daily and plan to wean by 5mg  daily.    PAST MEDICAL HISTORY   Past Medical History:  Diagnosis Date   Anemia    Arthritis    Asthma    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/2016   Cancer (HCC) 2012   Right Lung CA   COPD (chronic obstructive pulmonary disease) (HCC)    Depression    Diabetes mellitus without complication (HCC)    Patient takes Janumet   Essential hypertension 05/26/2016   Heart  failure (HCC) 2022   Hydropneumothorax 05/03/2020   Hypercholesteremia    Oxygen  dependent    2L at nite    PAD (peripheral artery disease) (HCC) 06/22/2016   Peripheral vascular disease (HCC)    Personal history of radiation therapy    Shortness of breath dyspnea    with exertion    Sialolithiasis    Sleep apnea    Wears dentures    full upper and lower     SURGICAL HISTORY   Past Surgical History:  Procedure Laterality Date   APPLICATION OF CELL SAVER Left 01/21/2024   Procedure: APPLICATION OF CELL SAVER;  Surgeon: Marea Selinda RAMAN, MD;  Location: [facility];  Service: Vascular;  Laterality: Left;   CESAREAN SECTION     x3   COLONOSCOPY N/A 04/03/2024   Procedure: COLONOSCOPY;  Surgeon: Jinny Carmine, MD;  Location: [facility];  Service: Endoscopy;  Laterality: N/A;   COLONOSCOPY WITH PROPOFOL  N/A 06/25/2015   Procedure: COLONOSCOPY WITH PROPOFOL ;  Surgeon: Carmine Jinny, MD;  Location: [facility];  Service: Endoscopy;  Laterality: N/A;   COLONOSCOPY WITH PROPOFOL  N/A 07/26/2020   Procedure: COLONOSCOPY WITH PROPOFOL ;  Surgeon: Jinny Carmine, MD;  Location: [facility];  Service: Endoscopy;  Laterality: N/A;   CYST REMOVAL LEG     and on shoulder    ENDARTERECTOMY FEMORAL Left  01/21/2024   Procedure: LEFT COMMON, SUPERFICIAL FEMORAL AND PROFUNDA FEMORIS ENDARTERECTOMY WITH BOVINE PERICARDIAL PATCH ANGIOPLASTY;  Surgeon: Marea Selinda RAMAN, MD;  Location: [facility];  Service: Vascular;  Laterality: Left;   ESOPHAGOGASTRODUODENOSCOPY (EGD) WITH PROPOFOL  N/A 07/26/2020   Procedure: ESOPHAGOGASTRODUODENOSCOPY (EGD) WITH PROPOFOL ;  Surgeon: Jinny Carmine, MD;  Location: [facility];  Service: Endoscopy;  Laterality: N/A;  Diabetic - oral meds   INSERTION OF ILIAC STENT Left 01/21/2024   Procedure: INSERTION, STENT, ARTERY, ILIAC;  Surgeon: Marea Selinda RAMAN, MD;  Location: [facility];  Service: Vascular;  Laterality: Left;  AND SFA STENTS   LOWER EXTREMITY ANGIOGRAPHY Left 09/29/2018    Procedure: LOWER EXTREMITY ANGIOGRAPHY;  Surgeon: Marea Selinda RAMAN, MD;  Location: [facility];  Service: Cardiovascular;  Laterality: Left;   LOWER EXTREMITY ANGIOGRAPHY Left 07/29/2023   Procedure: Lower Extremity Angiography;  Surgeon: Marea Selinda RAMAN, MD;  Location: [facility];  Service: Cardiovascular;  Laterality: Left;   LOWER EXTREMITY ANGIOGRAPHY Right 12/20/2023   Procedure: Lower Extremity Angiography;  Surgeon: Marea Selinda RAMAN, MD;  Location: [facility];  Service: Cardiovascular;  Laterality: Right;   LOWER EXTREMITY ANGIOGRAPHY Left 01/17/2024   Procedure: Lower Extremity Angiography;  Surgeon: Marea Selinda RAMAN, MD;  Location: [facility];  Service: Cardiovascular;  Laterality: Left;   LUNG BIOPSY  12/30/2011   has lung spots   PACEMAKER IMPLANT  07/14/2021   PACEMAKER LEADLESS INSERTION N/A 07/14/2021   Procedure: PACEMAKER LEADLESS INSERTION;  Surgeon: Ammon Blunt, MD;  Location: [facility];  Service: Cardiovascular;  Laterality: N/A;   PERIPHERAL VASCULAR CATHETERIZATION Left 06/01/2016   Procedure: Lower Extremity Angiography;  Surgeon: Selinda RAMAN Marea, MD;  Location: [facility];  Service: Cardiovascular;  Laterality: Left;   PERIPHERAL VASCULAR CATHETERIZATION N/A 06/01/2016   Procedure: Abdominal Aortogram w/Lower Extremity;  Surgeon: Selinda RAMAN Marea, MD;  Location: [facility];  Service: Cardiovascular;  Laterality: N/A;   PERIPHERAL VASCULAR CATHETERIZATION  06/01/2016   Procedure: Lower Extremity Intervention;  Surgeon: Selinda RAMAN Marea, MD;  Location: [facility];  Service: Cardiovascular;;   PERIPHERAL VASCULAR CATHETERIZATION Right 06/08/2016   Procedure: Lower Extremity Angiography;  Surgeon: Selinda RAMAN Marea, MD;  Location: [facility];  Service: Cardiovascular;  Laterality: Right;   PERIPHERAL VASCULAR CATHETERIZATION  06/08/2016   Procedure: Lower Extremity Intervention;  Surgeon: Selinda RAMAN Marea, MD;   Location: [facility];  Service: Cardiovascular;;   POLYPECTOMY  04/03/2024   Procedure: POLYPECTOMY, INTESTINE;  Surgeon: Jinny Carmine, MD;  Location: [facility];  Service: Endoscopy;;   SUBMANDIBULAR GLAND EXCISION Left 12/06/2020   Procedure: EXCISION SUBMANDIBULAR GLAND;  Surgeon: Herminio Miu, MD;  Location: [facility];  Service: ENT;  Laterality: Left;  needs to be first case Diabetic - diet controlled   TEMPORARY PACEMAKER N/A 07/11/2021   Procedure: TEMPORARY PACEMAKER;  Surgeon: Ammon Blunt, MD;  Location: [facility];  Service: Cardiovascular;  Laterality: N/A;     FAMILY HISTORY   Family History  Problem Relation Age of Onset   Diabetes Mother    Hypercholesterolemia Mother    Lung cancer Father    Diabetes Sister    Diabetes Sister    Hypertension Sister    Diabetes Maternal Grandmother    Diabetes Paternal Grandmother    Heart attack Brother    Coronary artery disease Brother    Vascular Disease Brother    Heart attack Brother  Breast cancer Neg Hx      SOCIAL HISTORY   Social History   Tobacco Use   Smoking status: Former    Current packs/day: 0.00    Average packs/day: 1 pack/day for 37.0 years (37.0 ttl pk-yrs)    Types: Cigarettes    Start date: 02/06/1973    Quit date: 02/06/2010    Years since quitting: 14.2   Smokeless tobacco: Former    Types: Snuff  Vaping Use   Vaping status: Never Used  Substance Use Topics   Alcohol use: Not Currently    Alcohol/week: 5.0 standard drinks of alcohol    Types: 5 Cans of beer per week    Comment: /h x of alcohol abuse -stopped 2012- now drinks 5 beer per week   Drug use: Not Currently    Types: Marijuana, Crack cocaine, Cocaine    Comment: hx of cocaine use- last use 2015; last use marijuana6/22/19,     MEDICATIONS    Home Medication:    Current Medication:  Current Facility-Administered Medications:    acetaminophen  (TYLENOL ) tablet 650 mg, 650 mg,  Oral, Q6H PRN, 650 mg at 04/19/24 0803 **OR** acetaminophen  (TYLENOL ) suppository 650 mg, 650 mg, Rectal, Q6H PRN, Paudel, Keshab, MD   ALPRAZolam  (XANAX ) tablet 0.5 mg, 0.5 mg, Oral, BID PRN, Paudel, Keshab, MD, 0.5 mg at 04/20/24 2156   aspirin  EC tablet 81 mg, 81 mg, Oral, Daily, Paudel, Keshab, MD, 81 mg at 04/21/24 0858   atorvastatin  (LIPITOR) tablet 10 mg, 10 mg, Oral, Daily, Paudel, Keshab, MD, 10 mg at 04/21/24 0856   budesonide -glycopyrrolate -formoterol  (BREZTRI ) 160-9-4.8 MCG/ACT inhaler 2 puff, 2 puff, Inhalation, BID, Niels Kayla FALCON, RPH, 2 puff at 04/21/24 0857   clopidogrel  (PLAVIX ) tablet 75 mg, 75 mg, Oral, Daily, Paudel, Keshab, MD, 75 mg at 04/21/24 0856   cyclobenzaprine  (FLEXERIL ) tablet 10 mg, 10 mg, Oral, QHS PRN, Alexander, Natalie, DO, 10 mg at 04/19/24 2048   enoxaparin  (LOVENOX ) injection 40 mg, 40 mg, Subcutaneous, Q24H, Paudel, Keshab, MD, 40 mg at 04/21/24 0856   escitalopram  (LEXAPRO ) tablet 10 mg, 10 mg, Oral, Daily, Alexander, Natalie, DO, 10 mg at 04/21/24 9143   ferrous sulfate  tablet 325 mg, 325 mg, Oral, Q breakfast, Marsa Edelman, DO, 325 mg at 04/21/24 9143   insulin  aspart (novoLOG ) injection 0-5 Units, 0-5 Units, Subcutaneous, QHS, Paudel, Keshab, MD, 5 Units at 04/20/24 2152   insulin  aspart (novoLOG ) injection 0-9 Units, 0-9 Units, Subcutaneous, TID WC, Greenwood, Howard F, RPH, 2 Units at 04/21/24 0857   insulin  aspart (novoLOG ) injection 4 Units, 4 Units, Subcutaneous, TID WC, Alexander, Natalie, DO, 4 Units at 04/21/24 9141   insulin  glargine (LANTUS ) injection 8 Units, 8 Units, Subcutaneous, QHS, Alexander, Natalie, DO, 8 Units at 04/20/24 2152   losartan  (COZAAR ) tablet 12.5 mg, 12.5 mg, Oral, Daily, Alexander, Natalie, DO, 12.5 mg at 04/21/24 9143   metoprolol  succinate (TOPROL -XL) 24 hr tablet 25 mg, 25 mg, Oral, Daily, Alexander, Natalie, DO, 25 mg at 04/21/24 0856   ondansetron  (ZOFRAN ) tablet 4 mg, 4 mg, Oral, Q6H PRN **OR** ondansetron   (ZOFRAN ) injection 4 mg, 4 mg, Intravenous, Q6H PRN, Paudel, Keshab, MD   oxyCODONE -acetaminophen  (PERCOCET/ROXICET) 5-325 MG per tablet 1 tablet, 1 tablet, Oral, Q6H PRN, Paudel, Keshab, MD, 1 tablet at 04/21/24 0855   pantoprazole  (PROTONIX ) EC tablet 40 mg, 40 mg, Oral, Daily, Paudel, Keshab, MD, 40 mg at 04/21/24 0856   polyethylene glycol (MIRALAX  / GLYCOLAX ) packet 17 g, 17 g, Oral, Daily PRN, Paudel, Keshab,  MD   [START ON 04/22/2024] predniSONE  (DELTASONE ) tablet 40 mg, 40 mg, Oral, Q breakfast, Kristapher [place], MD   pregabalin  (LYRICA ) capsule 75 mg, 75 mg, Oral, BID, Alexander, Natalie, DO, 75 mg at 04/21/24 0856    ALLERGIES   Iodinated contrast media, Trelegy ellipta  [fluticasone -umeclidin-vilant], and Bactoshield chg [chlorhexidine  gluconate]     REVIEW OF SYSTEMS    Review of Systems:  Gen:  Denies  fever, sweats, chills weigh loss  HEENT: Denies blurred vision, double vision, ear pain, eye pain, hearing loss, nose bleeds, sore throat Cardiac:  No dizziness, chest pain or heaviness, chest tightness,edema Resp:   reports dyspnea chronically  Gi: Denies swallowing difficulty, stomach pain, nausea or vomiting, diarrhea, constipation, bowel incontinence Gu:  Denies bladder incontinence, burning urine Ext:   Denies Joint pain, stiffness or swelling Skin: Denies  skin rash, easy bruising or bleeding or hives Endoc:  Denies polyuria, polydipsia , polyphagia or weight change Psych:   Denies depression, insomnia or hallucinations   Other:  All other systems negative   VS: BP 128/78 (BP Location: Left Arm)   Pulse 66   Temp 97.9 F (36.6 C)   Resp 13   Ht 4' 11 (1.499 m)   Wt 82.5 kg   SpO2 100%   BMI 36.74 kg/m      PHYSICAL EXAM    GENERAL:NAD, no fevers, chills, no weakness no fatigue HEAD: Normocephalic, atraumatic.  EYES: Pupils equal, round, reactive to light. Extraocular muscles intact. No scleral icterus.  MOUTH: Moist mucosal membrane. Dentition  intact. No abscess noted.  EAR, NOSE, THROAT: Clear without exudates. No external lesions.  NECK: Supple. No thyromegaly. No nodules. No JVD.  PULMONARY: decreased breath sounds with mild rhonchi worse at bases bilaterally.  CARDIOVASCULAR: S1 and S2. Regular rate and rhythm. No murmurs, rubs, or gallops. No edema. Pedal pulses 2+ bilaterally.  GASTROINTESTINAL: Soft, nontender, nondistended. No masses. Positive bowel sounds. No hepatosplenomegaly.  MUSCULOSKELETAL: No swelling, clubbing, or edema. Range of motion full in all extremities.  NEUROLOGIC: Cranial nerves II through XII are intact. No gross focal neurological deficits. Sensation intact. Reflexes intact.  SKIN: No ulceration, lesions, rashes, or cyanosis. Skin warm and dry. Turgor intact.  PSYCHIATRIC: Mood, affect within normal limits. The patient is awake, alert and oriented x 3. Insight, judgment intact.       IMAGING   Narrative & Impression  CLINICAL DATA:  COPD exacerbation.   EXAM: PORTABLE CHEST 1 VIEW   COMPARISON:  04/09/2024   FINDINGS: Cardiopericardial silhouette is at upper limits of normal for size. Lungs are hyperexpanded. Scattered areas of architectural distortion/scarring are noted bilaterally. Pleuroparenchymal opacity in the right base is mildly improved in the interval. No acute bony abnormality. Telemetry leads overlie the chest.   IMPRESSION: Mild interval improvement in right base pleuroparenchymal opacity likely a combination of airspace disease and pleural fluid.     Electronically Signed   By: Camellia Candle M.D.   On: 04/18/2024 06:43    ASSESSMENT/PLAN   Acute on chronic hypoxemic hypercapneic respiratory failure     Patient with AE-COPD - moderate     - responding well to current therapy with steroids and antibiotics    -mild rhonchi only on lung examination    -CRP -mildly elevated   Viral workup negative   -continue Breztre Aerosphere BID   - reducing steroids to PO  prednisone   Repeat VBG and CXR today for dc planning   Chronic diastolic CHF    - on  diuresis and cardiac medications  DM 2    - continue glycemic control with ISS ac/hs   - continue home regimen   - diabetic cardiac diet   Physical deconditioning and atelectasis    - incentive spirometry     - PT/OT           Thank you for allowing me to participate in the care of this patient.   Patient/Family are satisfied with care plan and all questions have been answered.    Provider disclosure: Patient with at least one acute or chronic illness or injury that poses a threat to life or bodily function and is being managed actively during this encounter.  All of the below services have been performed independently by signing provider:  review of prior documentation from internal and or external health records.  Review of previous and current lab results.  Interview and comprehensive assessment during patient visit today. Review of current and previous chest radiographs/CT scans. Discussion of management and test interpretation with health care team and patient/family.   This document was prepared using Dragon voice recognition software and may include unintentional dictation errors.     Sumi Lye, M.D.  Division of Pulmonary & Critical Care Medicine

## 2024-04-21 NOTE — Discharge Summary (Signed)
 Physician Discharge Summary   Patient: Angela Jensen MRN: [id number]  DOB: 06-01-1956   Admit:     Date of Admission: 04/18/2024 Admitted from: home   Discharge: Date of discharge: 04/21/24 Disposition: Home Condition at discharge: good  CODE STATUS: FULL CODE     Discharge Physician: Laneta Blunt, DO [facility]     PCP: Liana Fish, NP  Recommendations for Outpatient Follow-up:  Follow up with PCP Liana Fish, NP in 2-4 weeks Follow up as directed w/ pulmonology   Discharge Instructions     Amb Referral to Cardiac Rehabilitation   Complete by: As directed    Diagnosis: Heart Failure (see criteria below if ordering Phase II)   Heart Failure Type: Chronic Diastolic   After initial evaluation and assessments completed: Virtual Based Care may be provided alone or in conjunction with Phase 2 Cardiac Rehab based on patient barriers.: Yes   Intensive Cardiac Rehabilitation (ICR) [facility] location only OR Traditional Cardiac Rehabilitation (TCR) *If criteria for ICR are not met will enroll in TCR ([facility] only): Yes   Amb Referral to Cardiac Rehabilitation   Complete by: As directed    Severe COPD   Diagnosis:  Heart Failure (see criteria below if ordering Phase II) Other     Heart Failure Type: Chronic Diastolic   After initial evaluation and assessments completed: Virtual Based Care may be provided alone or in conjunction with Phase 2 Cardiac Rehab based on patient barriers.: Yes   Intensive Cardiac Rehabilitation (ICR) [facility] location only OR Traditional Cardiac Rehabilitation (TCR) *If criteria for ICR are not met will enroll in TCR [facility] only): Yes   Diet - low sodium heart healthy   Complete by: As directed    Increase activity slowly   Complete by: As directed          Discharge Diagnoses: Principal Problem:   Acute hypoxic on chronic hypercapnic respiratory failure (HCC) Active Problems:   Chronic combined systolic and diastolic CHF  (congestive heart failure) (HCC)   HTN (hypertension)   Diabetes mellitus (HCC)   Mixed hyperlipidemia   Chronic respiratory failure with hypoxia (HCC)   Anxiety and depression   MGUS (monoclonal gammopathy of unknown significance)     Hospital course / significant events:   HPI: Angela Jensen is a pleasant 68 y.o. female with medical history significant for COPD on 4 L nasal cannula oxygen  at home, CHF s/p pacemaker, HTN, HLD, depression, sleep apnea who was recently admitted to [facility] and was discharged on 04/12/2024 after being treated for COPD exacerbation came in 04/18/24 for complaining of progressive shortness of breath started few hours prior to coming to ED.   09/02: to ED. 178/112, hypoxemic requiring BiPAP, x-ray did not show obvious pneumonia but there was mild pleural effusion on the right side, VBG showed hypercapnia around 90 and low pH. Patient was initiated on BiPAP, received 40 mg of IV Lasix  one-time, steroid, nebulization. Admitted to hospitalist w/ concern for COPD + CHF.  09/03: pulmonary consult. Of note, pt reports drinking a lot of an herbal tea recently which claims to detox the lungs question mild fluid overload / unknown effects from alternative unregulated treatments. To po steroids.  09/04: reduce metoprolol  d/t occasional bradycardia, resume losartan  at lower dose.  09/05: about at baseline. VBG and CXR repeated, no concerns. Plan taper prednisone  40 mg --> 5 mg daily. Dr Aleskerov has placed orders for BiPAP DME w/ settings     Consultants:  Pulmonology  Procedures/Surgeries: none      ASSESSMENT & PLAN:   Acute on chronic hypoxemic and hypercarbic respiratory failure likely due to combination of COPD and possibly mild CHF exacerbation BiPAP DME w/ settings orders placed today by pulmonology  Treat underlying cause(s) as below   COPD BiPAP DME w/ settings orders placed today by pulmonology to continue BiPAP at bedtime at home Steroids prednisone   40 mg --> 5 mg taper per pulmonary  continue Breztre Aerosphere BID  Pulmonary follow up outpatient Advised there is no detox for the lungs and she should not buy OTC products claiming to do this   HFpEF in acute exacerbation - improved/resolved HTN/HLD CAD Echocardiogram in June 2025 which showed ejection fraction 60 to 65%.  No need for repeat echocardiogram at this point. Diuresis as needed continue home regimen  Reduced beta blocker d/t intermittent bradycardia aspirin /Plavix  and statin.  Type 2 diabetes continue her on home insulin  and oral hypoglycemic medication  Anxiety depression Continue alprazolam  as needed      Class 2 obesity based on BMI: Body mass index is 36.78 kg/m.SABRA Significantly low or high BMI is associated with higher medical risk.  Underweight - under 18  overweight - 25 to 29 obese - 30 or more Class 1 obesity: BMI of 30.0 to 34 Class 2 obesity: BMI of 35.0 to 39 Class 3 obesity: BMI of 40.0 to 49 Super Morbid Obesity: BMI 50-59 Super-super Morbid Obesity: BMI 60+ Healthy nutrition and physical activity advised as adjunct to other disease management and risk reduction treatments             Discharge Instructions  Allergies as of 04/21/2024       Reactions   Iodinated Contrast Media Itching   Trelegy Ellipta  [fluticasone -umeclidin-vilant]    Had breathing issues   Bactoshield Chg [chlorhexidine  Gluconate] Itching, Other (See Comments)   Dry. Flaking, peeling skin        Medication List     STOP taking these medications    dexamethasone  1 MG tablet Commonly known as: DECADRON    Dulera  200-5 MCG/ACT Aero Generic drug: mometasone -formoterol    Spiriva  HandiHaler 18 MCG inhalation capsule Generic drug: tiotropium   sulfamethoxazole -trimethoprim  400-80 MG tablet Commonly known as: BACTRIM        TAKE these medications    Accu-Chek Guide Test test strip Generic drug: glucose blood USE TO TEST BLOOD SUGAR 5 TIMES DAILY    Accu-Chek Guide w/Device Kit Use as directed Dx e11.65   Accu-Chek Softclix Lancets lancets Use 1 lancet 3 times daily to check glucose for diabetes   albuterol  (5 MG/ML) 0.5% nebulizer solution Commonly known as: PROVENTIL  Take 2.5 mg by nebulization every 6 (six) hours as needed for wheezing or shortness of breath.   albuterol  108 (90 Base) MCG/ACT inhaler Commonly known as: VENTOLIN  HFA INHALE 2 PUFFS BY MOUTH EVERY 6 HOURS AS NEEDED FOR WHEEZING OR SHORTNESS OF BREATH   ALPRAZolam  0.5 MG tablet Commonly known as: XANAX  Take 1 tablet (0.5 mg total) by mouth 2 (two) times daily as needed for anxiety.   aspirin  EC 81 MG tablet Take 1 tablet (81 mg total) by mouth daily. Swallow whole.   atorvastatin  10 MG tablet Commonly known as: Lipitor Take 1 tablet (10 mg total) by mouth daily.   budesonide -glycopyrrolate -formoterol  160-9-4.8 MCG/ACT Aero inhaler Commonly known as: BREZTRI  Inhale 2 puffs into the lungs 2 (two) times daily.   calcium  carbonate 1500 (600 Ca) MG Tabs tablet Commonly known as: OSCAL Take 600 mg of  elemental calcium  by mouth 2 (two) times daily with a meal.   clopidogrel  75 MG tablet Commonly known as: PLAVIX  Take 1 tablet (75 mg total) by mouth daily.   cyclobenzaprine  10 MG tablet Commonly known as: FLEXERIL  Take 1 tablet (10 mg total) by mouth at bedtime. Take one tab po qhs for back spasm prn only What changed:  when to take this reasons to take this   escitalopram  10 MG tablet Commonly known as: LEXAPRO  Take 1 tablet (10 mg total) by mouth daily.   ferrous sulfate  325 (65 FE) MG tablet Take 1 tablet (325 mg total) by mouth daily with breakfast.   furosemide  20 MG tablet Commonly known as: LASIX  Take 1 tablet (20 mg total) by mouth daily as needed.   insulin  glargine (1 Unit Dial ) 300 UNIT/ML Solostar Pen Commonly known as: TOUJEO  Inject 10 Units into the skin daily.   ipratropium-albuterol  0.5-2.5 (3) MG/3ML Soln Commonly known as:  DUONEB USE 1 VIAL IN NEBULIZER EVERY 6 HOURS AS NEEDED What changed: See the new instructions.   losartan  25 MG tablet Commonly known as: COZAAR  Take 0.5 tablets (12.5 mg total) by mouth daily.   metFORMIN  500 MG 24 hr tablet Commonly known as: GLUCOPHAGE -XR TAKE 2 TABLETS BY MOUTH ONCE DAILY WITH BREAKFAST   metoprolol  succinate 50 MG 24 hr tablet Commonly known as: TOPROL -XL Take 0.5 tablets (25 mg total) by mouth daily. Take with or immediately following a meal. What changed: how much to take   oxyCODONE -acetaminophen  5-325 MG tablet Commonly known as: PERCOCET/ROXICET Take 1 tablet by mouth every 6 (six) hours as needed for severe pain (pain score 7-10).   OXYGEN  Inhale 4 L into the lungs. PT USES [facility] FOR OXYGEN    pantoprazole  40 MG tablet Commonly known as: Protonix  Take 1 tablet (40 mg total) by mouth daily.   potassium chloride  10 MEQ tablet Commonly known as: KLOR-CON  TAKE 1 TABLET BY MOUTH EVERY OTHER DAY   predniSONE  5 MG tablet Commonly known as: DELTASONE  Take 8 tablets (40 mg total) by mouth daily with breakfast for 1 day, THEN 7 tablets (35 mg total) daily with breakfast for 1 day, THEN 6 tablets (30 mg total) daily with breakfast for 1 day, THEN 5 tablets (25 mg total) daily with breakfast for 1 day, THEN 4 tablets (20 mg total) daily with breakfast for 1 day, THEN 3 tablets (15 mg total) daily with breakfast for 1 day, THEN 2 tablets (10 mg total) daily with breakfast for 1 day, THEN 1 tablet (5 mg total) daily with breakfast for 23 days. Start taking on: April 22, 2024   pregabalin  75 MG capsule Commonly known as: Lyrica  Take 1 capsule (75 mg total) by mouth 2 (two) times daily.   ReliOn Pen Needles 31G X 6 MM Misc Generic drug: Insulin  Pen Needle USE 1 PEN NEEDLE WITH INSULIN  PEN ONCE DAILY FOR DIABETES   vitamin B-12 100 MCG tablet Commonly known as: CYANOCOBALAMIN  Take 100 mcg by mouth daily.   VITAMIN D  (CHOLECALCIFEROL ) PO Take 1  tablet by mouth in the morning.               Durable Medical Equipment  (From admission, onward)           Start     Ordered   04/21/24 1519  For home use only DME Bipap  Once       Question Answer Comment  Length of Need Lifetime   Bleed in oxygen  (LPM)  3   Keep O2 saturation between Over 88%   Inspiratory pressure 15   Expiratory pressure 5      04/21/24 1519              Allergies  Allergen Reactions   Iodinated Contrast Media Itching   Trelegy Ellipta  [Fluticasone -Umeclidin-Vilant]     Had breathing issues   Bactoshield Chg [Chlorhexidine  Gluconate] Itching and Other (See Comments)    Dry. Flaking, peeling skin     Subjective: pt feeling better today, about at her usual level SOB w/ exertion. She is ambulating well, tolerating diet,    Discharge Exam: BP 127/81 (BP Location: Left Arm)   Pulse 85   Temp 98.6 F (37 C)   Resp 19   Ht 4' 11 (1.499 m)   Wt 82.5 kg   SpO2 93%   BMI 36.74 kg/m  General: Pt is alert, awake, not in acute distress Cardiovascular: RRR, S1/S2 +, no rubs, no gallops Respiratory: diminished breath sounds all fields and scattered coarse sounds stable from previous exam  Abdominal: Soft Extremities: no edema, no cyanosis     The results of significant diagnostics from this hospitalization (including imaging, microbiology, ancillary and laboratory) are listed below for reference.     Microbiology: Recent Results (from the past 240 hours)  Resp panel by RT-PCR (RSV, Flu A&B, Covid) Anterior Nasal Swab     Status: None   Collection Time: 04/18/24  6:22 AM   Specimen: Anterior Nasal Swab  Result Value Ref Range Status   SARS Coronavirus 2 by RT PCR NEGATIVE NEGATIVE Final    Comment: (NOTE) SARS-CoV-2 target nucleic acids are NOT DETECTED.  The SARS-CoV-2 RNA is generally detectable in upper respiratory specimens during the acute phase of infection. The lowest concentration of SARS-CoV-2 viral copies this assay  can detect is 138 copies/mL. A negative result does not preclude SARS-Cov-2 infection and should not be used as the sole basis for treatment or other patient management decisions. A negative result may occur with  improper specimen collection/handling, submission of specimen other than nasopharyngeal swab, presence of viral mutation(s) within the areas targeted by this assay, and inadequate number of viral copies(<138 copies/mL). A negative result must be combined with clinical observations, patient history, and epidemiological information. The expected result is Negative.  Fact Sheet for Patients:  BloggerCourse.com  Fact Sheet for Healthcare Providers:  SeriousBroker.it  This test is no t yet approved or cleared by the [place]  FDA and  has been authorized for detection and/or diagnosis of SARS-CoV-2 by FDA under an Emergency Use Authorization (EUA). This EUA will remain  in effect (meaning this test can be used) for the duration of the COVID-19 declaration under Section 564(b)(1) of the Act, 21 U.S.C.section 360bbb-3(b)(1), unless the authorization is terminated  or revoked sooner.       Influenza A by PCR NEGATIVE NEGATIVE Final   Influenza B by PCR NEGATIVE NEGATIVE Final    Comment: (NOTE) The Xpert Xpress SARS-CoV-2/FLU/RSV plus assay is intended as an aid in the diagnosis of influenza from Nasopharyngeal swab specimens and should not be used as a sole basis for treatment. Nasal washings and aspirates are unacceptable for Xpert Xpress SARS-CoV-2/FLU/RSV testing.  Fact Sheet for Patients: BloggerCourse.com  Fact Sheet for Healthcare Providers: SeriousBroker.it  This test is not yet approved or cleared by the [place]  FDA and has been authorized for detection and/or diagnosis of SARS-CoV-2 by FDA under an Emergency Use Authorization (EUA). This EUA will  remain in effect (meaning this test can be used) for the duration of the COVID-19 declaration under Section 564(b)(1) of the Act, 21 U.S.C. section 360bbb-3(b)(1), unless the authorization is terminated or revoked.     Resp Syncytial Virus by PCR NEGATIVE NEGATIVE Final    Comment: (NOTE) Fact Sheet for Patients: BloggerCourse.com  Fact Sheet for Healthcare Providers: SeriousBroker.it  This test is not yet approved or cleared by the [place]  FDA and has been authorized for detection and/or diagnosis of SARS-CoV-2 by FDA under an Emergency Use Authorization (EUA). This EUA will remain in effect (meaning this test can be used) for the duration of the COVID-19 declaration under Section 564(b)(1) of the Act, 21 U.S.C. section 360bbb-3(b)(1), unless the authorization is terminated or revoked.  Performed at [facility], [address] [place], [place] [address]   Respiratory (~20 pathogens) panel by PCR     Status: None   Collection Time: 04/19/24  3:10 PM   Specimen: Nasopharyngeal Swab; Respiratory  Result Value Ref Range Status   Adenovirus NOT DETECTED NOT DETECTED Final   Coronavirus 229E NOT DETECTED NOT DETECTED Final    Comment: (NOTE) The Coronavirus on the Respiratory Panel, DOES NOT test for the novel  Coronavirus (2019 nCoV)    Coronavirus HKU1 NOT DETECTED NOT DETECTED Final   Coronavirus NL63 NOT DETECTED NOT DETECTED Final   Coronavirus OC43 NOT DETECTED NOT DETECTED Final   Metapneumovirus NOT DETECTED NOT DETECTED Final   Rhinovirus / Enterovirus NOT DETECTED NOT DETECTED Final   Influenza A NOT DETECTED NOT DETECTED Final   Influenza B NOT DETECTED NOT DETECTED Final   Parainfluenza Virus 1 NOT DETECTED NOT DETECTED Final   Parainfluenza Virus 2 NOT DETECTED NOT DETECTED Final   Parainfluenza Virus 3 NOT DETECTED NOT DETECTED Final   Parainfluenza Virus 4 NOT DETECTED NOT DETECTED Final    Respiratory Syncytial Virus NOT DETECTED NOT DETECTED Final   Bordetella pertussis NOT DETECTED NOT DETECTED Final   Bordetella Parapertussis NOT DETECTED NOT DETECTED Final   Chlamydophila pneumoniae NOT DETECTED NOT DETECTED Final   Mycoplasma pneumoniae NOT DETECTED NOT DETECTED Final    Comment: Performed at [facility], [address], [place], [place] [address]     Labs: BNP (last 3 results) Recent Labs    03/31/24 1848 04/09/24 0834 04/18/24 0622  BNP 57.5 259.4* 146.8*   Basic Metabolic Panel: Recent Labs  Lab 04/18/24 0622 04/19/24 0413  NA 141 140  K 4.3 4.4  CL 101 91*  CO2 31 32  GLUCOSE 190* 290*  BUN 16 38*  CREATININE 0.84 0.84  CALCIUM  9.4 9.1   Liver Function Tests: Recent Labs  Lab 04/18/24 0622  AST 45*  ALT 38  ALKPHOS 68  BILITOT 0.6  PROT 6.6  ALBUMIN 3.5   No results for input(s): LIPASE, AMYLASE in the last 168 hours. No results for input(s): AMMONIA in the last 168 hours. CBC: Recent Labs  Lab 04/18/24 0622 04/19/24 0413  WBC 9.6 8.1  NEUTROABS 6.5  --   HGB 11.0* 10.8*  HCT 36.5 35.0*  MCV 94.8 90.2  PLT 195 263   Cardiac Enzymes: No results for input(s): CKTOTAL, CKMB, CKMBINDEX, TROPONINI in the last 168 hours. BNP: Invalid input(s): POCBNP CBG: Recent Labs  Lab 04/20/24 1149 04/20/24 1614 04/20/24 2131 04/21/24 0804 04/21/24 1108  GLUCAP 292* 288* 396* 181* 282*   D-Dimer No results for input(s): DDIMER in the last 72 hours. Hgb A1c Recent Labs  04/18/24 1823  HGBA1C 7.2*   Lipid Profile No results for input(s): CHOL, HDL, LDLCALC, TRIG, CHOLHDL, LDLDIRECT in the last 72 hours. Thyroid  function studies No results for input(s): TSH, T4TOTAL, T3FREE, THYROIDAB in the last 72 hours.  Invalid input(s): FREET3 Anemia work up No results for input(s): VITAMINB12, FOLATE, FERRITIN, TIBC, IRON , RETICCTPCT in the last 72 hours. Urinalysis    Component  Value Date/Time   COLORURINE STRAW (A) 04/17/2023 0930   APPEARANCEUR CLEAR (A) 04/17/2023 0930   APPEARANCEUR Clear 08/06/2022 1541   LABSPEC 1.014 04/17/2023 0930   PHURINE 6.0 04/17/2023 0930   GLUCOSEU NEGATIVE 04/17/2023 0930   HGBUR MODERATE (A) 04/17/2023 0930   BILIRUBINUR NEGATIVE 04/17/2023 0930   BILIRUBINUR Negative 08/06/2022 1541   KETONESUR NEGATIVE 04/17/2023 0930   PROTEINUR NEGATIVE 04/17/2023 0930   UROBILINOGEN 0.2 04/22/2021 0959   NITRITE NEGATIVE 04/17/2023 0930   LEUKOCYTESUR NEGATIVE 04/17/2023 0930   Sepsis Labs Recent Labs  Lab 04/18/24 0622 04/19/24 0413  WBC 9.6 8.1   Microbiology Recent Results (from the past 240 hours)  Resp panel by RT-PCR (RSV, Flu A&B, Covid) Anterior Nasal Swab     Status: None   Collection Time: 04/18/24  6:22 AM   Specimen: Anterior Nasal Swab  Result Value Ref Range Status   SARS Coronavirus 2 by RT PCR NEGATIVE NEGATIVE Final    Comment: (NOTE) SARS-CoV-2 target nucleic acids are NOT DETECTED.  The SARS-CoV-2 RNA is generally detectable in upper respiratory specimens during the acute phase of infection. The lowest concentration of SARS-CoV-2 viral copies this assay can detect is 138 copies/mL. A negative result does not preclude SARS-Cov-2 infection and should not be used as the sole basis for treatment or other patient management decisions. A negative result may occur with  improper specimen collection/handling, submission of specimen other than nasopharyngeal swab, presence of viral mutation(s) within the areas targeted by this assay, and inadequate number of viral copies(<138 copies/mL). A negative result must be combined with clinical observations, patient history, and epidemiological information. The expected result is Negative.  Fact Sheet for Patients:  BloggerCourse.com  Fact Sheet for Healthcare Providers:  SeriousBroker.it  This test is no t yet  approved or cleared by the [place]  FDA and  has been authorized for detection and/or diagnosis of SARS-CoV-2 by FDA under an Emergency Use Authorization (EUA). This EUA will remain  in effect (meaning this test can be used) for the duration of the COVID-19 declaration under Section 564(b)(1) of the Act, 21 U.S.C.section 360bbb-3(b)(1), unless the authorization is terminated  or revoked sooner.       Influenza A by PCR NEGATIVE NEGATIVE Final   Influenza B by PCR NEGATIVE NEGATIVE Final    Comment: (NOTE) The Xpert Xpress SARS-CoV-2/FLU/RSV plus assay is intended as an aid in the diagnosis of influenza from Nasopharyngeal swab specimens and should not be used as a sole basis for treatment. Nasal washings and aspirates are unacceptable for Xpert Xpress SARS-CoV-2/FLU/RSV testing.  Fact Sheet for Patients: BloggerCourse.com  Fact Sheet for Healthcare Providers: SeriousBroker.it  This test is not yet approved or cleared by the [place]  FDA and has been authorized for detection and/or diagnosis of SARS-CoV-2 by FDA under an Emergency Use Authorization (EUA). This EUA will remain in effect (meaning this test can be used) for the duration of the COVID-19 declaration under Section 564(b)(1) of the Act, 21 U.S.C. section 360bbb-3(b)(1), unless the authorization is terminated or revoked.     Resp Syncytial Virus by PCR  NEGATIVE NEGATIVE Final    Comment: (NOTE) Fact Sheet for Patients: BloggerCourse.com  Fact Sheet for Healthcare Providers: SeriousBroker.it  This test is not yet approved or cleared by the [place]  FDA and has been authorized for detection and/or diagnosis of SARS-CoV-2 by FDA under an Emergency Use Authorization (EUA). This EUA will remain in effect (meaning this test can be used) for the duration of the COVID-19 declaration under Section 564(b)(1) of  the Act, 21 U.S.C. section 360bbb-3(b)(1), unless the authorization is terminated or revoked.  Performed at [facility], [address] [place], [place] [address]   Respiratory (~20 pathogens) panel by PCR     Status: None   Collection Time: 04/19/24  3:10 PM   Specimen: Nasopharyngeal Swab; Respiratory  Result Value Ref Range Status   Adenovirus NOT DETECTED NOT DETECTED Final   Coronavirus 229E NOT DETECTED NOT DETECTED Final    Comment: (NOTE) The Coronavirus on the Respiratory Panel, DOES NOT test for the novel  Coronavirus (2019 nCoV)    Coronavirus HKU1 NOT DETECTED NOT DETECTED Final   Coronavirus NL63 NOT DETECTED NOT DETECTED Final   Coronavirus OC43 NOT DETECTED NOT DETECTED Final   Metapneumovirus NOT DETECTED NOT DETECTED Final   Rhinovirus / Enterovirus NOT DETECTED NOT DETECTED Final   Influenza A NOT DETECTED NOT DETECTED Final   Influenza B NOT DETECTED NOT DETECTED Final   Parainfluenza Virus 1 NOT DETECTED NOT DETECTED Final   Parainfluenza Virus 2 NOT DETECTED NOT DETECTED Final   Parainfluenza Virus 3 NOT DETECTED NOT DETECTED Final   Parainfluenza Virus 4 NOT DETECTED NOT DETECTED Final   Respiratory Syncytial Virus NOT DETECTED NOT DETECTED Final   Bordetella pertussis NOT DETECTED NOT DETECTED Final   Bordetella Parapertussis NOT DETECTED NOT DETECTED Final   Chlamydophila pneumoniae NOT DETECTED NOT DETECTED Final   Mycoplasma pneumoniae NOT DETECTED NOT DETECTED Final    Comment: Performed at [facility], [address], [place], [place] [address]   Imaging DG Chest [place] 1 View Result Date: 04/18/2024 CLINICAL DATA:  COPD exacerbation. EXAM: PORTABLE CHEST 1 VIEW COMPARISON:  04/09/2024 FINDINGS: Cardiopericardial silhouette is at upper limits of normal for size. Lungs are hyperexpanded. Scattered areas of architectural distortion/scarring are noted bilaterally. Pleuroparenchymal opacity in the right base is mildly improved in the  interval. No acute bony abnormality. Telemetry leads overlie the chest. IMPRESSION: Mild interval improvement in right base pleuroparenchymal opacity likely a combination of airspace disease and pleural fluid. Electronically Signed   By: Camellia Candle M.D.   On: 04/18/2024 06:43      Time coordinating discharge: over 30 minutes  SIGNED:  Kortney Schoenfelder DO [facility]

## 2024-04-21 NOTE — Progress Notes (Addendum)
   04/21/24 0900  Oxygen  Therapy  SpO2 100 %  O2 Device Nasal Cannula  O2 Flow Rate (L/min) 4 L/min  Mobility  Activity Ambulated with assistance;Moved bed into chair position  Level of Assistance Contact guard assist, steadying assist  Assistive Device Front wheel walker  Distance Ambulated (ft) 360 ft  Range of Motion/Exercises All extremities  Activity Response Tolerated well  Mobility visit 1 Mobility  Mobility Specialist Start Time (ACUTE ONLY) [id number]  Mobility Specialist Stop Time (ACUTE ONLY) 0946  Mobility Specialist Time Calculation (min) (ACUTE ONLY) 28 min   Mobility Specialist - Progress Note  Pt was in bed with bed alarm on upon arrival. Pt independently used bathroom and washed face with O2 set to 4 LPM before ambulation. Pt ambulated well with 4WW and breaks halfway to recover for her own understanding of distance. O2 level maintained above 88% throughout activities. Once returned to room pt performed arm and leg extensions. Pt was returned in bed with needs in reach and bed alarm turned on.  Clem Rodes Mobility Specialist 04/21/24, 10:03 AM

## 2024-04-21 NOTE — TOC Transition Note (Signed)
 Transition of Care [facility]) - Discharge Note   Patient Details  Name: Angela Jensen MRN: [id number] Date of Birth: August 17, 1956  [facility] CM/SW Contact:  Lauraine JAYSON Carpen, LCSW Phone Number: 04/21/2024, 3:37 PM   Clinical Narrative:  Patient has orders to discharge home today. Pulmonology entered DME order for home bipap. [employer] confirmed patient has a home NIV through them and said she received it in November. Attending physician is aware. CSW called and notified her that [facility] can still see her for nursing but she said she just wants to do cardiopulmonary rehab. Left message for [facility] liaison to notify. No further concerns. CSW signing off.  Final next level of care: OP Rehab Barriers to Discharge: Barriers Resolved   Patient Goals and CMS Choice            Discharge Placement                Patient to be transferred to facility by: Significant other   Patient and family notified of of transfer: 04/21/24  Discharge Plan and Services Additional resources added to the After Visit Summary for       Post Acute Care Choice:  (Cardiopulmonary rehab)                               Social Drivers of Health (SDOH) Interventions SDOH Screenings   Food Insecurity: Patient Declined (04/18/2024)  Housing: Low Risk  (04/18/2024)  Transportation Needs: No Transportation Needs (04/18/2024)  Utilities: Not At Risk (04/18/2024)  Alcohol Screen: Low Risk  (12/11/2021)  Depression (PHQ2-9): Medium Risk (04/13/2024)  Social Connections: Moderately Integrated (04/18/2024)  Tobacco Use: Medium Risk (04/18/2024)     Readmission Risk Interventions    04/19/2024   12:06 PM 03/21/2024    3:56 PM 01/26/2024   10:40 AM  Readmission Risk Prevention Plan  Transportation Screening Complete Complete Complete  PCP or Specialist Appt within 3-5 Days   Complete  HRI or Home Care Consult  Complete   Social Work Consult for Recovery Care Planning/Counseling  Complete Complete   Palliative Care Screening  Not Applicable Not Applicable  Medication Review Oceanographer) Complete Complete Complete  PCP or Specialist appointment within 3-5 days of discharge Complete    SW Recovery Care/Counseling Consult Complete    Palliative Care Screening Not Applicable    Skilled Nursing Facility Not Applicable

## 2024-04-21 NOTE — Plan of Care (Signed)

## 2024-04-21 NOTE — Plan of Care (Signed)

## 2024-04-21 NOTE — Progress Notes (Signed)
 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 EKG performed which showed a AV paced rhythm. Informed attending Laneta Blunt of rhythm strips and EKG results.Attending voiced understanding, no orders given, will continue to monitor.

## 2024-04-21 NOTE — Plan of Care (Signed)
 Problem: Education: Goal: Knowledge of General Education information will improve Description: Including pain rating scale, medication(s)/side effects and non-pharmacologic comfort measures 04/21/2024 1700 by Arloa Dene KIDD, RN Outcome: Adequate for Discharge 04/21/2024 1054 by Arloa Dene KIDD, RN Outcome: Progressing   Problem: Health Behavior/Discharge Planning: Goal: Ability to manage health-related needs will improve 04/21/2024 1700 by Arloa Dene KIDD, RN Outcome: Adequate for Discharge 04/21/2024 1054 by Arloa Dene KIDD, RN Outcome: Progressing   Problem: Clinical Measurements: Goal: Ability to maintain clinical measurements within normal limits will improve 04/21/2024 1700 by Arloa Dene KIDD, RN Outcome: Adequate for Discharge 04/21/2024 1054 by Arloa Dene KIDD, RN Outcome: Progressing Goal: Will remain free from infection 04/21/2024 1700 by Arloa Dene KIDD, RN Outcome: Adequate for Discharge 04/21/2024 1054 by Arloa Dene KIDD, RN Outcome: Progressing Goal: Diagnostic test results will improve 04/21/2024 1700 by Arloa Dene KIDD, RN Outcome: Adequate for Discharge 04/21/2024 1054 by Arloa Dene KIDD, RN Outcome: Progressing Goal: Respiratory complications will improve 04/21/2024 1700 by Arloa Dene KIDD, RN Outcome: Adequate for Discharge 04/21/2024 1054 by Arloa Dene KIDD, RN Outcome: Progressing Goal: Cardiovascular complication will be avoided 04/21/2024 1700 by Arloa Dene KIDD, RN Outcome: Adequate for Discharge 04/21/2024 1054 by Arloa Dene KIDD, RN Outcome: Progressing   Problem: Activity: Goal: Risk for activity intolerance will decrease 04/21/2024 1700 by Arloa Dene KIDD, RN Outcome: Adequate for Discharge 04/21/2024 1054 by Arloa Dene KIDD, RN Outcome: Progressing   Problem: Nutrition: Goal: Adequate nutrition will be maintained 04/21/2024 1700 by Arloa Dene KIDD, RN Outcome: Adequate for Discharge 04/21/2024 1054 by Arloa Dene KIDD, RN Outcome: Progressing   Problem: Coping: Goal: Level of anxiety will decrease 04/21/2024 1700 by Arloa Dene KIDD, RN Outcome: Adequate for Discharge 04/21/2024 1054 by Arloa Dene KIDD, RN Outcome: Progressing   Problem: Elimination: Goal: Will not experience complications related to bowel motility 04/21/2024 1700 by Arloa Dene KIDD, RN Outcome: Adequate for Discharge 04/21/2024 1054 by Arloa Dene KIDD, RN Outcome: Progressing Goal: Will not experience complications related to urinary retention 04/21/2024 1700 by Arloa Dene KIDD, RN Outcome: Adequate for Discharge 04/21/2024 1054 by Arloa Dene KIDD, RN Outcome: Progressing   Problem: Pain Managment: Goal: General experience of comfort will improve and/or be controlled 04/21/2024 1700 by Arloa Dene KIDD, RN Outcome: Adequate for Discharge 04/21/2024 1054 by Arloa Dene KIDD, RN Outcome: Progressing   Problem: Safety: Goal: Ability to remain free from injury will improve 04/21/2024 1700 by Arloa Dene KIDD, RN Outcome: Adequate for Discharge 04/21/2024 1054 by Arloa Dene KIDD, RN Outcome: Progressing   Problem: Skin Integrity: Goal: Risk for impaired skin integrity will decrease 04/21/2024 1700 by Arloa Dene KIDD, RN Outcome: Adequate for Discharge 04/21/2024 1054 by Arloa Dene KIDD, RN Outcome: Progressing   Problem: Education: Goal: Ability to describe self-care measures that may prevent or decrease complications (Diabetes Survival Skills Education) will improve 04/21/2024 1700 by Arloa Dene KIDD, RN Outcome: Adequate for Discharge 04/21/2024 1054 by Arloa Dene KIDD, RN Outcome: Progressing Goal: Individualized Educational Video(s) 04/21/2024 1700 by Arloa Dene KIDD, RN Outcome: Adequate for Discharge 04/21/2024 1054 by Arloa Dene KIDD, RN Outcome: Progressing   Problem: Coping: Goal: Ability to adjust to condition or change in health will improve 04/21/2024 1700 by Arloa Dene KIDD,  RN Outcome: Adequate for Discharge 04/21/2024 1054 by Arloa Dene KIDD, RN Outcome: Progressing   Problem: Fluid Volume: Goal: Ability to maintain a balanced intake and output will improve 04/21/2024 1700 by Arloa Dene KIDD, RN Outcome: Adequate for Discharge 04/21/2024  1054 by Arloa Dene KIDD, RN Outcome: Progressing   Problem: Health Behavior/Discharge Planning: Goal: Ability to identify and utilize available resources and services will improve 04/21/2024 1700 by Arloa Dene KIDD, RN Outcome: Adequate for Discharge 04/21/2024 1054 by Arloa Dene KIDD, RN Outcome: Progressing Goal: Ability to manage health-related needs will improve 04/21/2024 1700 by Arloa Dene KIDD, RN Outcome: Adequate for Discharge 04/21/2024 1054 by Arloa Dene KIDD, RN Outcome: Progressing   Problem: Metabolic: Goal: Ability to maintain appropriate glucose levels will improve 04/21/2024 1700 by Arloa Dene KIDD, RN Outcome: Adequate for Discharge 04/21/2024 1054 by Arloa Dene KIDD, RN Outcome: Progressing   Problem: Nutritional: Goal: Maintenance of adequate nutrition will improve 04/21/2024 1700 by Arloa Dene KIDD, RN Outcome: Adequate for Discharge 04/21/2024 1054 by Arloa Dene KIDD, RN Outcome: Progressing Goal: Progress toward achieving an optimal weight will improve 04/21/2024 1700 by Arloa Dene KIDD, RN Outcome: Adequate for Discharge 04/21/2024 1054 by Arloa Dene KIDD, RN Outcome: Progressing   Problem: Skin Integrity: Goal: Risk for impaired skin integrity will decrease 04/21/2024 1700 by Arloa Dene KIDD, RN Outcome: Adequate for Discharge 04/21/2024 1054 by Arloa Dene KIDD, RN Outcome: Progressing   Problem: Tissue Perfusion: Goal: Adequacy of tissue perfusion will improve 04/21/2024 1700 by Arloa Dene KIDD, RN Outcome: Adequate for Discharge 04/21/2024 1054 by Arloa Dene KIDD, RN Outcome: Progressing

## 2024-04-24 ENCOUNTER — Encounter

## 2024-04-26 ENCOUNTER — Encounter

## 2024-04-26 ENCOUNTER — Ambulatory Visit (INDEPENDENT_AMBULATORY_CARE_PROVIDER_SITE_OTHER): Admitting: Nurse Practitioner

## 2024-04-26 DIAGNOSIS — J449 Chronic obstructive pulmonary disease, unspecified: Secondary | ICD-10-CM

## 2024-04-26 DIAGNOSIS — E1165 Type 2 diabetes mellitus with hyperglycemia: Secondary | ICD-10-CM

## 2024-04-26 LAB — GLUCOSE, CAPILLARY
Glucose-Capillary: 475 mg/dL — ABNORMAL HIGH (ref 70–99)
Glucose-Capillary: 488 mg/dL — ABNORMAL HIGH (ref 70–99)

## 2024-04-26 MED ORDER — RELION PEN NEEDLES 31G X 6 MM MISC: 11 refills | Status: AC

## 2024-04-26 MED ORDER — ALPRAZOLAM 1 MG PO TABS: 1.0000 mg | ORAL_TABLET | Freq: Two times a day (BID) | ORAL | 2 refills | Status: DC | PRN

## 2024-04-26 MED ORDER — BUDESON-GLYCOPYRROL-FORMOTEROL 160-9-4.8 MCG/ACT IN AERO: 2.0000 | INHALATION_SPRAY | Freq: Two times a day (BID) | RESPIRATORY_TRACT | 11 refills | Status: AC

## 2024-04-26 MED ORDER — METOPROLOL SUCCINATE ER 50 MG PO TB24: 50.0000 mg | ORAL_TABLET | Freq: Every day | ORAL | 3 refills | Status: AC

## 2024-04-26 NOTE — Progress Notes (Signed)
 [facility] [place], [place] [address] [place] [address] [phone]                                   Transitional Care Clinic   [facility] Discharge Acute Issues Care Follow Up                                                                        Patient Demographics  Angela Jensen, is a 68 y.o. female  DOB 1956-06-21  MRN [id number].  Primary MD  Liana Fish, NP  Admit:     Date of Admission: 04/18/2024 Admitted from: home    Discharge: Date of discharge: 04/21/24    Reason for TCC follow Up - hypoxemic respiratory failure and COPD exacerbation.    Past Medical History:  Diagnosis Date   Anemia    Arthritis    Asthma    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/2016   Cancer (HCC) 2012   Right Lung CA   COPD (chronic obstructive pulmonary disease) (HCC)    Depression    Diabetes mellitus without complication (HCC)    Patient takes Janumet   Essential hypertension 05/26/2016   Heart failure (HCC) 2022   Hydropneumothorax 05/03/2020   Hypercholesteremia    Oxygen  dependent    2L at nite    PAD (peripheral artery disease) (HCC) 06/22/2016   Peripheral vascular disease (HCC)    Personal history of radiation therapy    Shortness of breath dyspnea    with exertion    Sialolithiasis    Sleep apnea    Wears dentures    full upper and lower    Past Surgical History:  Procedure Laterality Date   APPLICATION OF CELL SAVER Left 01/21/2024   Procedure: APPLICATION OF CELL SAVER;  Surgeon: Marea Selinda RAMAN, MD;  Location: [facility];  Service: Vascular;  Laterality: Left;   CESAREAN SECTION     x3   COLONOSCOPY N/A 04/03/2024   Procedure: COLONOSCOPY;  Surgeon: Jinny Carmine, MD;  Location: [facility];  Service: Endoscopy;  Laterality: N/A;   COLONOSCOPY WITH PROPOFOL  N/A 06/25/2015   Procedure: COLONOSCOPY WITH PROPOFOL ;  Surgeon: Carmine Jinny, MD;  Location: [facility];  Service:  Endoscopy;  Laterality: N/A;   COLONOSCOPY WITH PROPOFOL  N/A 07/26/2020   Procedure: COLONOSCOPY WITH PROPOFOL ;  Surgeon: Jinny Carmine, MD;  Location: [facility];  Service: Endoscopy;  Laterality: N/A;   CYST REMOVAL LEG     and on shoulder    ENDARTERECTOMY FEMORAL Left 01/21/2024   Procedure: LEFT COMMON, SUPERFICIAL FEMORAL AND PROFUNDA FEMORIS ENDARTERECTOMY WITH BOVINE PERICARDIAL PATCH ANGIOPLASTY;  Surgeon: Marea Selinda RAMAN, MD;  Location: [facility];  Service: Vascular;  Laterality: Left;   ESOPHAGOGASTRODUODENOSCOPY (EGD) WITH PROPOFOL  N/A 07/26/2020   Procedure: ESOPHAGOGASTRODUODENOSCOPY (EGD) WITH PROPOFOL ;  Surgeon: Jinny Carmine, MD;  Location: [facility];  Service: Endoscopy;  Laterality: N/A;  Diabetic - oral meds   INSERTION OF ILIAC STENT Left 01/21/2024   Procedure: INSERTION, STENT, ARTERY, ILIAC;  Surgeon: Marea Selinda RAMAN, MD;  Location: [facility];  Service: Vascular;  Laterality: Left;  AND SFA STENTS  LOWER EXTREMITY ANGIOGRAPHY Left 09/29/2018   Procedure: LOWER EXTREMITY ANGIOGRAPHY;  Surgeon: Marea Selinda RAMAN, MD;  Location: [facility];  Service: Cardiovascular;  Laterality: Left;   LOWER EXTREMITY ANGIOGRAPHY Left 07/29/2023   Procedure: Lower Extremity Angiography;  Surgeon: Marea Selinda RAMAN, MD;  Location: [facility];  Service: Cardiovascular;  Laterality: Left;   LOWER EXTREMITY ANGIOGRAPHY Right 12/20/2023   Procedure: Lower Extremity Angiography;  Surgeon: Marea Selinda RAMAN, MD;  Location: [facility];  Service: Cardiovascular;  Laterality: Right;   LOWER EXTREMITY ANGIOGRAPHY Left 01/17/2024   Procedure: Lower Extremity Angiography;  Surgeon: Marea Selinda RAMAN, MD;  Location: [facility];  Service: Cardiovascular;  Laterality: Left;   LUNG BIOPSY  12/30/2011   has lung spots   PACEMAKER IMPLANT  07/14/2021   PACEMAKER LEADLESS INSERTION N/A 07/14/2021   Procedure: PACEMAKER LEADLESS INSERTION;  Surgeon: Ammon Blunt, MD;   Location: [facility];  Service: Cardiovascular;  Laterality: N/A;   PERIPHERAL VASCULAR CATHETERIZATION Left 06/01/2016   Procedure: Lower Extremity Angiography;  Surgeon: Selinda RAMAN Marea, MD;  Location: [facility];  Service: Cardiovascular;  Laterality: Left;   PERIPHERAL VASCULAR CATHETERIZATION N/A 06/01/2016   Procedure: Abdominal Aortogram w/Lower Extremity;  Surgeon: Selinda RAMAN Marea, MD;  Location: [facility];  Service: Cardiovascular;  Laterality: N/A;   PERIPHERAL VASCULAR CATHETERIZATION  06/01/2016   Procedure: Lower Extremity Intervention;  Surgeon: Selinda RAMAN Marea, MD;  Location: [facility];  Service: Cardiovascular;;   PERIPHERAL VASCULAR CATHETERIZATION Right 06/08/2016   Procedure: Lower Extremity Angiography;  Surgeon: Selinda RAMAN Marea, MD;  Location: [facility];  Service: Cardiovascular;  Laterality: Right;   PERIPHERAL VASCULAR CATHETERIZATION  06/08/2016   Procedure: Lower Extremity Intervention;  Surgeon: Selinda RAMAN Marea, MD;  Location: [facility];  Service: Cardiovascular;;   POLYPECTOMY  04/03/2024   Procedure: POLYPECTOMY, INTESTINE;  Surgeon: Jinny Carmine, MD;  Location: [facility];  Service: Endoscopy;;   SUBMANDIBULAR GLAND EXCISION Left 12/06/2020   Procedure: EXCISION SUBMANDIBULAR GLAND;  Surgeon: Herminio Miu, MD;  Location: [facility];  Service: ENT;  Laterality: Left;  needs to be first case Diabetic - diet controlled   TEMPORARY PACEMAKER N/A 07/11/2021   Procedure: TEMPORARY PACEMAKER;  Surgeon: Ammon Blunt, MD;  Location: [facility];  Service: Cardiovascular;  Laterality: N/A;       Recent HPI and Hospital Course  Hospital course / significant events:    HPI: Angela Jensen is a pleasant 68 y.o. female with medical history significant for COPD on 4 L nasal cannula oxygen  at home, CHF s/p pacemaker, HTN, HLD, depression, sleep apnea who was recently admitted to [facility] and was discharged  on 04/12/2024 after being treated for COPD exacerbation came in 04/18/24 for complaining of progressive shortness of breath started few hours prior to coming to ED.    09/02: to ED. 178/112, hypoxemic requiring BiPAP, x-ray did not show obvious pneumonia but there was mild pleural effusion on the right side, VBG showed hypercapnia around 90 and low pH. Patient was initiated on BiPAP, received 40 mg of IV Lasix  one-time, steroid, nebulization. Admitted to hospitalist w/ concern for COPD + CHF.  09/03: pulmonary consult. Of note, pt reports drinking a lot of an herbal tea recently which claims to detox the lungs question mild fluid overload / unknown effects from alternative unregulated treatments. To po steroids.  09/04: reduce metoprolol  d/t occasional bradycardia, resume losartan  at  lower dose.  09/05: about at baseline. VBG and CXR repeated, no concerns. Plan taper prednisone  40 mg --> 5 mg daily. Dr Aleskerov has placed orders for BiPAP DME w/ settings    ASSESSMENT & PLAN:   Acute on chronic hypoxemic and hypercarbic respiratory failure likely due to combination of COPD and possibly mild CHF exacerbation BiPAP DME w/ settings orders placed today by pulmonology  Treat underlying cause(s) as below    COPD BiPAP DME w/ settings orders placed today by pulmonology to continue BiPAP at bedtime at home Steroids prednisone  40 mg --> 5 mg taper per pulmonary  continue Breztre Aerosphere BID  Pulmonary follow up outpatient Advised there is no detox for the lungs and she should not buy OTC products claiming to do this    HFpEF in acute exacerbation - improved/resolved HTN/HLD CAD Echocardiogram in June 2025 which showed ejection fraction 60 to 65%.  No need for repeat echocardiogram at this point. Diuresis as needed continue home regimen  Reduced beta blocker d/t intermittent bradycardia aspirin /Plavix  and statin.   Type 2 diabetes continue her on home insulin  and oral hypoglycemic  medication   Anxiety depression Continue alprazolam  as needed       Class 2 obesity based on BMI: Body mass index is 36.78 kg/m.SABRA Significantly low or high BMI is associated with higher medical risk.  Underweight - under 18  overweight - 25 to 29 obese - 30 or more Class 1 obesity: BMI of 30.0 to 34 Class 2 obesity: BMI of 35.0 to 39 Class 3 obesity: BMI of 40.0 to 49 Super Morbid Obesity: BMI 50-59 Super-super Morbid Obesity: BMI 60+ Healthy nutrition and physical activity advised as adjunct to other disease management and risk reduction treatments    Post Hospital Acute Care Issue to be followed in the Clinic   Acute hypoxic on chronic hypercapnic respiratory failure (HCC) Active Problems:   Chronic combined systolic and diastolic CHF (congestive heart failure) (HCC)   HTN (hypertension)   Diabetes mellitus (HCC)   Mixed hyperlipidemia   Chronic respiratory failure with hypoxia (HCC)   Anxiety and depression   MGUS (monoclonal gammopathy of unknown significance)   Subjective:   Ronal Brain today has, No headache, No chest pain, No abdominal pain - No Nausea, No new weakness tingling or numbness, No Cough - SOB.   Assessment & Plan    There are no diagnoses linked to this encounter.   Reason for frequent admissions/ER visits **      Objective:   Vitals:   04/26/24 1059  BP: 130/85  Pulse: 82  Resp: 16  Temp: (!) 96.5 F (35.8 C)  SpO2: 95%  Weight: 116 lb 6.4 oz (52.8 kg)  Height: 4' 11 (1.499 m)    Wt Readings from Last 3 Encounters:  04/26/24 116 lb 6.4 oz (52.8 kg)  04/21/24 181 lb 14.1 oz (82.5 kg)  04/13/24 116 lb 3.2 oz (52.7 kg)    Allergies as of 04/26/2024       Reactions   Iodinated Contrast Media Itching   Trelegy Ellipta  [fluticasone -umeclidin-vilant]    Had breathing issues   Bactoshield Chg [chlorhexidine  Gluconate] Itching, Other (See Comments)   Dry. Flaking, peeling skin        Medication List        Accurate as of  April 26, 2024 11:59 PM. If you have any questions, ask your nurse or doctor.          Accu-Chek Guide Test test strip Generic  drug: glucose blood USE TO TEST BLOOD SUGAR 5 TIMES DAILY   Accu-Chek Guide w/Device Kit Use as directed Dx e11.65   Accu-Chek Softclix Lancets lancets Use 1 lancet 3 times daily to check glucose for diabetes   albuterol  (5 MG/ML) 0.5% nebulizer solution Commonly known as: PROVENTIL  Take 2.5 mg by nebulization every 6 (six) hours as needed for wheezing or shortness of breath.   albuterol  108 (90 Base) MCG/ACT inhaler Commonly known as: VENTOLIN  HFA INHALE 2 PUFFS BY MOUTH EVERY 6 HOURS AS NEEDED FOR WHEEZING OR SHORTNESS OF BREATH   ALPRAZolam  1 MG tablet Commonly known as: XANAX  Take 1 tablet (1 mg total) by mouth 2 (two) times daily as needed for anxiety or sleep. What changed:  medication strength how much to take reasons to take this Changed by: Mardy Maxin   aspirin  EC 81 MG tablet Take 1 tablet (81 mg total) by mouth daily. Swallow whole.   atorvastatin  10 MG tablet Commonly known as: Lipitor Take 1 tablet (10 mg total) by mouth daily.   budesonide -glycopyrrolate -formoterol  160-9-4.8 MCG/ACT Aero inhaler Commonly known as: BREZTRI  Inhale 2 puffs into the lungs 2 (two) times daily.   calcium  carbonate 1500 (600 Ca) MG Tabs tablet Commonly known as: OSCAL Take 600 mg of elemental calcium  by mouth 2 (two) times daily with a meal.   clopidogrel  75 MG tablet Commonly known as: PLAVIX  Take 1 tablet (75 mg total) by mouth daily.   cyclobenzaprine  10 MG tablet Commonly known as: FLEXERIL  Take 1 tablet (10 mg total) by mouth at bedtime. Take one tab po qhs for back spasm prn only What changed:  when to take this reasons to take this   escitalopram  10 MG tablet Commonly known as: LEXAPRO  Take 1 tablet (10 mg total) by mouth daily.   ferrous sulfate  325 (65 FE) MG tablet Take 1 tablet (325 mg total) by mouth daily with  breakfast.   furosemide  20 MG tablet Commonly known as: LASIX  Take 1 tablet (20 mg total) by mouth daily as needed.   insulin  glargine (1 Unit Dial ) 300 UNIT/ML Solostar Pen Commonly known as: TOUJEO  Inject 10 Units into the skin daily.   ipratropium-albuterol  0.5-2.5 (3) MG/3ML Soln Commonly known as: DUONEB USE 1 VIAL IN NEBULIZER EVERY 6 HOURS AS NEEDED   losartan  25 MG tablet Commonly known as: COZAAR  Take 0.5 tablets (12.5 mg total) by mouth daily.   metFORMIN  500 MG 24 hr tablet Commonly known as: GLUCOPHAGE -XR TAKE 2 TABLETS BY MOUTH ONCE DAILY WITH BREAKFAST   metoprolol  succinate 50 MG 24 hr tablet Commonly known as: TOPROL -XL Take 1 tablet (50 mg total) by mouth daily. Take with or immediately following a meal. May take 1/2 tablet if BP is less than 120/80. What changed:  how much to take additional instructions Changed by: Elva Mauro   oxyCODONE -acetaminophen  5-325 MG tablet Commonly known as: PERCOCET/ROXICET Take 1 tablet by mouth every 6 (six) hours as needed for severe pain (pain score 7-10).   OXYGEN  Inhale 4 L into the lungs. PT USES [facility] FOR OXYGEN    pantoprazole  40 MG tablet Commonly known as: Protonix  Take 1 tablet (40 mg total) by mouth daily.   potassium chloride  10 MEQ tablet Commonly known as: KLOR-CON  TAKE 1 TABLET BY MOUTH EVERY OTHER DAY   predniSONE  5 MG tablet Commonly known as: DELTASONE  Take 8 tablets (40 mg total) by mouth daily with breakfast for 1 day, THEN 7 tablets (35 mg total) daily with breakfast for 1  day, THEN 6 tablets (30 mg total) daily with breakfast for 1 day, THEN 5 tablets (25 mg total) daily with breakfast for 1 day, THEN 4 tablets (20 mg total) daily with breakfast for 1 day, THEN 3 tablets (15 mg total) daily with breakfast for 1 day, THEN 2 tablets (10 mg total) daily with breakfast for 1 day, THEN 1 tablet (5 mg total) daily with breakfast for 23 days. Start taking on: April 22, 2024   pregabalin   75 MG capsule Commonly known as: Lyrica  Take 1 capsule (75 mg total) by mouth 2 (two) times daily.   ReliOn Pen Needles 31G X 6 MM Misc Generic drug: Insulin  Pen Needle USE 1  pen needle with insulin  pen ONCE DAILY for diabetes What changed: See the new instructions. Changed by: Mardy Maxin   vitamin B-12 100 MCG tablet Commonly known as: CYANOCOBALAMIN  Take 100 mcg by mouth daily.   VITAMIN D  (CHOLECALCIFEROL ) PO Take 1 tablet by mouth in the morning.         Physical Exam: Constitutional: Patient appears well-developed and well-nourished. Not in obvious distress. HENT: Normocephalic, atraumatic, External right and left ear normal. Oropharynx is clear and moist.  Eyes: Conjunctivae and EOM are normal. PERRLA, no scleral icterus. Neck: Normal ROM. Neck supple. No JVD. No tracheal deviation. No thyromegaly. CVS: RRR, S1/S2 +, no murmurs, no gallops, no carotid bruit.  Pulmonary: Effort and breath sounds normal, no stridor, rhonchi, wheezes, rales. Wears continuous oxygen  Abdominal: Soft. BS +, no distension, tenderness, rebound or guarding.  Musculoskeletal: Normal range of motion. No edema and no tenderness.  Lymphadenopathy: No lymphadenopathy noted, cervical, inguinal or axillary Neuro: Alert. Normal reflexes, muscle tone coordination. No cranial nerve deficit. Skin: Skin is warm and dry. No rash noted. Not diaphoretic. No erythema. No pallor. Psychiatric: Normal mood and affect. Behavior, judgment, thought content normal.   Data Review   Micro Results Recent Results (from the past 240 hours)  Resp panel by RT-PCR (RSV, Flu A&B, Covid) Anterior Nasal Swab     Status: None   Collection Time: 04/18/24  6:22 AM   Specimen: Anterior Nasal Swab  Result Value Ref Range Status   SARS Coronavirus 2 by RT PCR NEGATIVE NEGATIVE Final    Comment: (NOTE) SARS-CoV-2 target nucleic acids are NOT DETECTED.  The SARS-CoV-2 RNA is generally detectable in upper  respiratory specimens during the acute phase of infection. The lowest concentration of SARS-CoV-2 viral copies this assay can detect is 138 copies/mL. A negative result does not preclude SARS-Cov-2 infection and should not be used as the sole basis for treatment or other patient management decisions. A negative result may occur with  improper specimen collection/handling, submission of specimen other than nasopharyngeal swab, presence of viral mutation(s) within the areas targeted by this assay, and inadequate number of viral copies(<138 copies/mL). A negative result must be combined with clinical observations, patient history, and epidemiological information. The expected result is Negative.  Fact Sheet for Patients:  BloggerCourse.com  Fact Sheet for Healthcare Providers:  SeriousBroker.it  This test is no t yet approved or cleared by the [place]  FDA and  has been authorized for detection and/or diagnosis of SARS-CoV-2 by FDA under an Emergency Use Authorization (EUA). This EUA will remain  in effect (meaning this test can be used) for the duration of the COVID-19 declaration under Section 564(b)(1) of the Act, 21 U.S.C.section 360bbb-3(b)(1), unless the authorization is terminated  or revoked sooner.       Influenza A  by PCR NEGATIVE NEGATIVE Final   Influenza B by PCR NEGATIVE NEGATIVE Final    Comment: (NOTE) The Xpert Xpress SARS-CoV-2/FLU/RSV plus assay is intended as an aid in the diagnosis of influenza from Nasopharyngeal swab specimens and should not be used as a sole basis for treatment. Nasal washings and aspirates are unacceptable for Xpert Xpress SARS-CoV-2/FLU/RSV testing.  Fact Sheet for Patients: BloggerCourse.com  Fact Sheet for Healthcare Providers: SeriousBroker.it  This test is not yet approved or cleared by the [place]  FDA and has been  authorized for detection and/or diagnosis of SARS-CoV-2 by FDA under an Emergency Use Authorization (EUA). This EUA will remain in effect (meaning this test can be used) for the duration of the COVID-19 declaration under Section 564(b)(1) of the Act, 21 U.S.C. section 360bbb-3(b)(1), unless the authorization is terminated or revoked.     Resp Syncytial Virus by PCR NEGATIVE NEGATIVE Final    Comment: (NOTE) Fact Sheet for Patients: BloggerCourse.com  Fact Sheet for Healthcare Providers: SeriousBroker.it  This test is not yet approved or cleared by the [place]  FDA and has been authorized for detection and/or diagnosis of SARS-CoV-2 by FDA under an Emergency Use Authorization (EUA). This EUA will remain in effect (meaning this test can be used) for the duration of the COVID-19 declaration under Section 564(b)(1) of the Act, 21 U.S.C. section 360bbb-3(b)(1), unless the authorization is terminated or revoked.  Performed at [facility], [address] [place], [place] [address]   Respiratory (~20 pathogens) panel by PCR     Status: None   Collection Time: 04/19/24  3:10 PM   Specimen: Nasopharyngeal Swab; Respiratory  Result Value Ref Range Status   Adenovirus NOT DETECTED NOT DETECTED Final   Coronavirus 229E NOT DETECTED NOT DETECTED Final    Comment: (NOTE) The Coronavirus on the Respiratory Panel, DOES NOT test for the novel  Coronavirus (2019 nCoV)    Coronavirus HKU1 NOT DETECTED NOT DETECTED Final   Coronavirus NL63 NOT DETECTED NOT DETECTED Final   Coronavirus OC43 NOT DETECTED NOT DETECTED Final   Metapneumovirus NOT DETECTED NOT DETECTED Final   Rhinovirus / Enterovirus NOT DETECTED NOT DETECTED Final   Influenza A NOT DETECTED NOT DETECTED Final   Influenza B NOT DETECTED NOT DETECTED Final   Parainfluenza Virus 1 NOT DETECTED NOT DETECTED Final   Parainfluenza Virus 2 NOT DETECTED NOT DETECTED Final    Parainfluenza Virus 3 NOT DETECTED NOT DETECTED Final   Parainfluenza Virus 4 NOT DETECTED NOT DETECTED Final   Respiratory Syncytial Virus NOT DETECTED NOT DETECTED Final   Bordetella pertussis NOT DETECTED NOT DETECTED Final   Bordetella Parapertussis NOT DETECTED NOT DETECTED Final   Chlamydophila pneumoniae NOT DETECTED NOT DETECTED Final   Mycoplasma pneumoniae NOT DETECTED NOT DETECTED Final    Comment: Performed at [facility], [address], [place], [place] [address]     CBC No results for input(s): WBC, HGB, HCT, PLT, MCV, MCH, MCHC, RDW, LYMPHSABS, MONOABS, EOSABS, BASOSABS, BANDABS in the last 168 hours.  Invalid input(s): NEUTRABS, BANDSABD  Chemistries  No results for input(s): NA, K, CL, CO2, GLUCOSE, BUN, CREATININE, CALCIUM , MG, AST, ALT, ALKPHOS, BILITOT in the last 168 hours.  Invalid input(s): GFRCGP ------------------------------------------------------------------------------------------------------------------ estimated creatinine clearance is 47.6 mL/min (by C-G formula based on SCr of 0.84 mg/dL). ------------------------------------------------------------------------------------------------------------------ No results for input(s): HGBA1C in the last 72 hours. ------------------------------------------------------------------------------------------------------------------ No results for input(s): CHOL, HDL, LDLCALC, TRIG, CHOLHDL, LDLDIRECT in the last 72 hours. ------------------------------------------------------------------------------------------------------------------ No results  for input(s): TSH, T4TOTAL, T3FREE, THYROIDAB in the last 72 hours.  Invalid input(s): FREET3 ------------------------------------------------------------------------------------------------------------------ No results for input(s): VITAMINB12, FOLATE, FERRITIN, TIBC, IRON ,  RETICCTPCT in the last 72 hours.  Coagulation profile No results for input(s): INR, PROTIME in the last 168 hours.  No results for input(s): DDIMER in the last 72 hours.  Cardiac Enzymes No results for input(s): CKMB, TROPONINI, MYOGLOBIN in the last 168 hours.  Invalid input(s): CK ------------------------------------------------------------------------------------------------------------------ Invalid input(s): POCBNP  Return for previously scheduled, F/U, Callen Vancuren PCP in a october. .   Time Spent in minutes  45 Time spent with patient included reviewing progress notes, labs, imaging studies, and discussing plan for follow up.   This patient was seen by Mardy Maxin, FNP-C in collaboration with Dr. Sigrid Bathe as a part of collaborative care agreement.    Mardy Maxin MSN, FNP-C on 04/26/2024 at 11:14 AM   **Disclaimer: This note may have been dictated with voice recognition software. Similar sounding words can inadvertently be transcribed and this note may contain transcription errors which may not have been corrected upon publication of note.**

## 2024-04-26 NOTE — Progress Notes (Signed)
 Incomplete Session Note  Patient Details  Name: Angela Jensen MRN: [id number] Date of Birth: Mar 17, 1956 Referring Provider:   [facility] from 04/13/2024 in [facility]  Referring Provider Parris Manna, MD    [name] Brain did not complete her rehab session.  [name] was scheduled for her first day but her blood glucose level was 475. [name] stated she is taking steroids and recently ate and had recently taken her medication (did not specify which medication). [name] requested to stay in the lobby for a recheck although she stated she was asymptomatic.   [name]'s recheck was 488. [name] was advised to contact her physician who is managing her diabetes or she could also go to the ER while she is here. [name] stated she felt good and had no symptoms and needed to take her insulin . She agreed to call her doctor. Her husband was present during the conversation and in agreement stating they will call her doctor asap.

## 2024-04-27 ENCOUNTER — Encounter: Payer: Self-pay | Admitting: Nurse Practitioner

## 2024-04-29 DIAGNOSIS — J449 Chronic obstructive pulmonary disease, unspecified: Secondary | ICD-10-CM | POA: Diagnosis not present

## 2024-04-29 DIAGNOSIS — J9610 Chronic respiratory failure, unspecified whether with hypoxia or hypercapnia: Secondary | ICD-10-CM | POA: Diagnosis not present

## 2024-05-01 ENCOUNTER — Encounter: Admitting: Emergency Medicine

## 2024-05-01 DIAGNOSIS — J449 Chronic obstructive pulmonary disease, unspecified: Secondary | ICD-10-CM

## 2024-05-01 LAB — GLUCOSE, CAPILLARY
Glucose-Capillary: 257 mg/dL — ABNORMAL HIGH (ref 70–99)
Glucose-Capillary: 245 mg/dL — ABNORMAL HIGH (ref 70–99)

## 2024-05-01 NOTE — Progress Notes (Signed)
 Daily Session Note  Patient Details  Name: Angela Jensen MRN: [id number] Date of Birth: May 13, 1956 Referring Provider:   [facility] from 04/13/2024 in [facility] Cardiac and Pulmonary Rehab  Referring Provider Parris Manna, MD    Encounter Date: 05/01/2024  Check In:  Session Check In - 05/01/24 1350       Check-In   Supervising physician immediately available to respond to emergencies See telemetry face sheet for immediately available ER MD    Location [facility]-Cardiac & Pulmonary Rehab    Staff Present Devaughn Jaeger, BS, Exercise Physiologist;Margaret Best, MS, Exercise Physiologist;Lyrika Souders RN,BSN;Meredith Tressa RN,BSN    Virtual Visit No    Medication changes reported     No    Fall or balance concerns reported    No    Warm-up and Cool-down Performed on first and last piece of equipment    Resistance Training Performed Yes    VAD Patient? No    PAD/SET Patient? No      Pain Assessment   Currently in Pain? No/denies             Social History   Tobacco Use  Smoking Status Former   Current packs/day: 0.00   Average packs/day: 1 pack/day for 37.0 years (37.0 ttl pk-yrs)   Types: Cigarettes   Start date: 02/06/1973   Quit date: 02/06/2010   Years since quitting: 14.2  Smokeless Tobacco Former   Types: Snuff    Goals Met:  Proper associated with RPD/PD & O2 Sat Independence with exercise equipment Using PLB without cueing & demonstrates good technique Exercise tolerated well No report of concerns or symptoms today Strength training completed today  Goals Unmet:  Not Applicable  Comments: First full day of exercise!  Patient was oriented to gym and equipment including functions, settings, policies, and procedures.  Patient's individual exercise prescription and treatment plan were reviewed.  All starting workloads were established based on the results of the 6 minute walk test done at initial orientation visit.  The plan for exercise progression  was also introduced and progression will be customized based on patient's performance and goals.    Dr. Oneil Pinal is Medical Director for [facility] Cardiac Rehabilitation.  Dr. Fuad Aleskerov is Medical Director for [facility] Pulmonary Rehabilitation.

## 2024-05-03 ENCOUNTER — Encounter

## 2024-05-03 DIAGNOSIS — J449 Chronic obstructive pulmonary disease, unspecified: Secondary | ICD-10-CM | POA: Diagnosis not present

## 2024-05-03 LAB — GLUCOSE, CAPILLARY
Glucose-Capillary: 217 mg/dL — ABNORMAL HIGH (ref 70–99)
Glucose-Capillary: 200 mg/dL — ABNORMAL HIGH (ref 70–99)

## 2024-05-03 NOTE — Progress Notes (Signed)
 Daily Session Note  Patient Details  Name: Angela Jensen MRN: [id number] Date of Birth: 20-Dec-1955 Referring Provider:   [facility] from 04/13/2024 in [facility] Cardiac and Pulmonary Rehab  Referring Provider Parris Manna, MD    Encounter Date: 05/03/2024  Check In:  Session Check In - 05/03/24 1355       Check-In   Supervising physician immediately available to respond to emergencies See telemetry face sheet for immediately available ER MD    Location [facility]-Cardiac & Pulmonary Rehab    Staff Present Burnard Davenport RN,BSN,MPA;Joseph [facility] Dyane BS, ACSM CEP, Exercise Physiologist;Laureen Delores, BS, RRT, CPFT    Virtual Visit No    Medication changes reported     No    Fall or balance concerns reported    No    Warm-up and Cool-down Performed on first and last piece of equipment    Resistance Training Performed Yes    VAD Patient? No    PAD/SET Patient? No      Pain Assessment   Currently in Pain? No/denies             Social History   Tobacco Use  Smoking Status Former   Current packs/day: 0.00   Average packs/day: 1 pack/day for 37.0 years (37.0 ttl pk-yrs)   Types: Cigarettes   Start date: 02/06/1973   Quit date: 02/06/2010   Years since quitting: 14.2  Smokeless Tobacco Former   Types: Snuff    Goals Met:  Proper associated with RPD/PD & O2 Sat Independence with exercise equipment Using PLB without cueing & demonstrates good technique Exercise tolerated well No report of concerns or symptoms today Strength training completed today  Goals Unmet:  Not Applicable  Comments: Pt able to follow exercise prescription today without complaint.  Will continue to monitor for progression.    Dr. Oneil Pinal is Medical Director for [facility] Cardiac Rehabilitation.  Dr. Fuad Aleskerov is Medical Director for [facility] Pulmonary Rehabilitation.

## 2024-05-08 ENCOUNTER — Telehealth: Payer: Self-pay

## 2024-05-08 ENCOUNTER — Encounter: Admitting: *Deleted

## 2024-05-08 DIAGNOSIS — J449 Chronic obstructive pulmonary disease, unspecified: Secondary | ICD-10-CM

## 2024-05-08 DIAGNOSIS — I998 Other disorder of circulatory system: Secondary | ICD-10-CM

## 2024-05-08 DIAGNOSIS — M79605 Pain in left leg: Secondary | ICD-10-CM

## 2024-05-08 DIAGNOSIS — I70229 Atherosclerosis of native arteries of extremities with rest pain, unspecified extremity: Secondary | ICD-10-CM

## 2024-05-08 LAB — GLUCOSE, CAPILLARY
Glucose-Capillary: 225 mg/dL — ABNORMAL HIGH (ref 70–99)
Glucose-Capillary: 197 mg/dL — ABNORMAL HIGH (ref 70–99)

## 2024-05-08 NOTE — Progress Notes (Signed)
 Daily Session Note  Patient Details  Name: Angela Jensen MRN: [id number] Date of Birth: 1955-10-27 Referring Provider:   [facility] from 04/13/2024 in [facility] Cardiac and Pulmonary Rehab  Referring Provider Parris Manna, MD    Encounter Date: 05/08/2024  Check In:  Session Check In - 05/08/24 1356       Check-In   Supervising physician immediately available to respond to emergencies See telemetry face sheet for immediately available ER MD    Location [facility]-Cardiac & Pulmonary Rehab    Staff Present Hoy Rodney RN,BSN;Noah Tickle, BS, Exercise Physiologist;Margaret Best, MS, Exercise Physiologist;Maxon Conetta BS, Exercise Physiologist    Virtual Visit No    Medication changes reported     No    Fall or balance concerns reported    No    Warm-up and Cool-down Performed on first and last piece of equipment    Resistance Training Performed Yes    VAD Patient? No    PAD/SET Patient? No      Pain Assessment   Currently in Pain? No/denies             Social History   Tobacco Use  Smoking Status Former   Current packs/day: 0.00   Average packs/day: 1 pack/day for 37.0 years (37.0 ttl pk-yrs)   Types: Cigarettes   Start date: 02/06/1973   Quit date: 02/06/2010   Years since quitting: 14.2  Smokeless Tobacco Former   Types: Snuff    Goals Met:  Independence with exercise equipment Exercise tolerated well No report of concerns or symptoms today Strength training completed today  Goals Unmet:  Not Applicable  Comments: Pt able to follow exercise prescription today without complaint.  Will continue to monitor for progression.    Dr. Oneil Pinal is Medical Director for [facility] Cardiac Rehabilitation.  Dr. Fuad Aleskerov is Medical Director for [facility] Pulmonary Rehabilitation.

## 2024-05-09 MED ORDER — OXYCODONE-ACETAMINOPHEN 5-325 MG PO TABS: 1.0000 | ORAL_TABLET | Freq: Four times a day (QID) | ORAL | 0 refills | Status: DC | PRN

## 2024-05-09 NOTE — Telephone Encounter (Signed)
 Pt advised we sent med

## 2024-05-10 ENCOUNTER — Telehealth: Payer: Self-pay | Admitting: Nurse Practitioner

## 2024-05-10 ENCOUNTER — Encounter

## 2024-05-10 DIAGNOSIS — J449 Chronic obstructive pulmonary disease, unspecified: Secondary | ICD-10-CM

## 2024-05-10 NOTE — Progress Notes (Signed)
 Daily Session Note  Patient Details  Name: REMI RESTER MRN: [id number] Date of Birth: 01-Dec-1955 Referring Provider:   [facility] from 04/13/2024 in [facility] Cardiac and Pulmonary Rehab  Referring Provider Parris Manna, MD    Encounter Date: 05/10/2024  Check In:  Session Check In - 05/10/24 1358       Check-In   Supervising physician immediately available to respond to emergencies See telemetry face sheet for immediately available ER MD    Location [facility]-Cardiac & Pulmonary Rehab    Staff Present Burnard Davenport RN,BSN,MPA;Maxon Conetta BS, Exercise Physiologist;Laura Cates RN,BSN;Noah Tickle, BS, Exercise Physiologist    Virtual Visit No    Medication changes reported     Yes    Comments zpac    Fall or balance concerns reported    No    Warm-up and Cool-down Performed on first and last piece of equipment    Resistance Training Performed Yes    VAD Patient? No    PAD/SET Patient? No      Pain Assessment   Currently in Pain? No/denies             Social History   Tobacco Use  Smoking Status Former   Current packs/day: 0.00   Average packs/day: 1 pack/day for 37.0 years (37.0 ttl pk-yrs)   Types: Cigarettes   Start date: 02/06/1973   Quit date: 02/06/2010   Years since quitting: 14.2  Smokeless Tobacco Former   Types: Snuff    Goals Met:  Proper associated with RPD/PD & O2 Sat Independence with exercise equipment Using PLB without cueing & demonstrates good technique Exercise tolerated well No report of concerns or symptoms today Strength training completed today  Goals Unmet:  Not Applicable  Comments: Pt able to follow exercise prescription today without complaint.  Will continue to monitor for progression.    Dr. Oneil Pinal is Medical Director for [facility] Cardiac Rehabilitation.  Dr. Fuad Aleskerov is Medical Director for [facility] Pulmonary Rehabilitation.

## 2024-05-10 NOTE — Telephone Encounter (Signed)
 Dermatology appointment 08/22/2024 with [facility]

## 2024-05-10 NOTE — Progress Notes (Signed)
 Pulmonary Individual Treatment Plan  Patient Details  Name: Angela Jensen MRN: [id number] Date of Birth: March 12, 1956 Referring Provider:   Conrad Ports Pulmonary Rehab from 04/13/2024 in [facility] Cardiac and Pulmonary Rehab  Referring Provider Parris Manna, MD    Initial Encounter Date:  Flowsheet Row Pulmonary Rehab from 04/13/2024 in [facility] Cardiac and Pulmonary Rehab  Date 04/13/24    Visit Diagnosis: Stage 4 very severe COPD by GOLD classification (HCC)  Patient's Home Medications on Admission:  Current Outpatient Medications:    ACCU-CHEK GUIDE TEST test strip, USE TO TEST BLOOD SUGAR 5 TIMES DAILY, Disp: 450 each, Rfl: 0   Accu-Chek Softclix Lancets lancets, Use 1 lancet 3 times daily to check glucose for diabetes, Disp: 300 each, Rfl: 1   albuterol  (PROVENTIL ) (5 MG/ML) 0.5% nebulizer solution, Take 2.5 mg by nebulization every 6 (six) hours as needed for wheezing or shortness of breath., Disp: , Rfl:    albuterol  (VENTOLIN  HFA) 108 (90 Base) MCG/ACT inhaler, INHALE 2 PUFFS BY MOUTH EVERY 6 HOURS AS NEEDED FOR WHEEZING OR SHORTNESS OF BREATH, Disp: 3 each, Rfl: 5   ALPRAZolam  (XANAX ) 1 MG tablet, Take 1 tablet (1 mg total) by mouth 2 (two) times daily as needed for anxiety or sleep., Disp: 60 tablet, Rfl: 2   aspirin  EC 81 MG tablet, Take 1 tablet (81 mg total) by mouth daily. Swallow whole., Disp: 150 tablet, Rfl: 2   atorvastatin  (LIPITOR) 10 MG tablet, Take 1 tablet (10 mg total) by mouth daily., Disp: 30 tablet, Rfl: 11   Blood Glucose Monitoring Suppl (ACCU-CHEK GUIDE) w/Device KIT, Use as directed Dx e11.65, Disp: 1 kit, Rfl: 0   budesonide -glycopyrrolate -formoterol  (BREZTRI ) 160-9-4.8 MCG/ACT AERO inhaler, Inhale 2 puffs into the lungs 2 (two) times daily., Disp: 10.7 g, Rfl: 11   calcium  carbonate (OSCAL) 1500 (600 Ca) MG TABS tablet, Take 600 mg of elemental calcium  by mouth 2 (two) times daily with a meal., Disp: , Rfl:    clopidogrel  (PLAVIX ) 75 MG tablet, Take 1 tablet (75  mg total) by mouth daily., Disp: 90 tablet, Rfl: 3   cyclobenzaprine  (FLEXERIL ) 10 MG tablet, Take 1 tablet (10 mg total) by mouth at bedtime. Take one tab po qhs for back spasm prn only (Patient taking differently: Take 10 mg by mouth at bedtime as needed for muscle spasms. Take one tab po qhs for back spasm prn only), Disp: 30 tablet, Rfl: 3   escitalopram  (LEXAPRO ) 10 MG tablet, Take 1 tablet (10 mg total) by mouth daily., Disp: 90 tablet, Rfl: 1   ferrous sulfate  325 (65 FE) MG tablet, Take 1 tablet (325 mg total) by mouth daily with breakfast., Disp: , Rfl:    furosemide  (LASIX ) 20 MG tablet, Take 1 tablet (20 mg total) by mouth daily as needed., Disp: 30 tablet, Rfl: 5   insulin  glargine, 1 Unit Dial , (TOUJEO ) 300 UNIT/ML Solostar Pen, Inject 10 Units into the skin daily., Disp: 1.5 mL, Rfl: 11   Insulin  Pen Needle (RELION PEN NEEDLES) 31G X 6 MM MISC, USE 1  pen needle with insulin  pen ONCE DAILY for diabetes, Disp: 50 each, Rfl: 11   ipratropium-albuterol  (DUONEB) 0.5-2.5 (3) MG/3ML SOLN, USE 1 VIAL IN NEBULIZER EVERY 6 HOURS AS NEEDED, Disp: 360 mL, Rfl: 0   losartan  (COZAAR ) 25 MG tablet, Take 0.5 tablets (12.5 mg total) by mouth daily., Disp: 30 tablet, Rfl: 0   metFORMIN  (GLUCOPHAGE -XR) 500 MG 24 hr tablet, TAKE 2 TABLETS BY MOUTH ONCE DAILY WITH BREAKFAST,  Disp: 180 tablet, Rfl: 0   metoprolol  succinate (TOPROL -XL) 50 MG 24 hr tablet, Take 1 tablet (50 mg total) by mouth daily. Take with or immediately following a meal. May take 1/2 tablet if BP is less than 120/80., Disp: 90 tablet, Rfl: 3   oxyCODONE -acetaminophen  (PERCOCET/ROXICET) 5-325 MG tablet, Take 1 tablet by mouth every 6 (six) hours as needed for severe pain (pain score 7-10)., Disp: 30 tablet, Rfl: 0   OXYGEN , Inhale 4 L into the lungs. PT USES [facility] FOR OXYGEN , Disp: , Rfl:    pantoprazole  (PROTONIX ) 40 MG tablet, Take 1 tablet (40 mg total) by mouth daily., Disp: 30 tablet, Rfl: 0   potassium chloride  (KLOR-CON ) 10 MEQ  tablet, TAKE 1 TABLET BY MOUTH EVERY OTHER DAY, Disp: 45 tablet, Rfl: 0   predniSONE  (DELTASONE ) 5 MG tablet, Take 8 tablets (40 mg total) by mouth daily with breakfast for 1 day, THEN 7 tablets (35 mg total) daily with breakfast for 1 day, THEN 6 tablets (30 mg total) daily with breakfast for 1 day, THEN 5 tablets (25 mg total) daily with breakfast for 1 day, THEN 4 tablets (20 mg total) daily with breakfast for 1 day, THEN 3 tablets (15 mg total) daily with breakfast for 1 day, THEN 2 tablets (10 mg total) daily with breakfast for 1 day, THEN 1 tablet (5 mg total) daily with breakfast for 23 days., Disp: 58 tablet, Rfl: 0   pregabalin  (LYRICA ) 75 MG capsule, Take 1 capsule (75 mg total) by mouth 2 (two) times daily., Disp: 60 capsule, Rfl: 2   vitamin B-12 (CYANOCOBALAMIN ) 100 MCG tablet, Take 100 mcg by mouth daily., Disp: , Rfl:    VITAMIN D , CHOLECALCIFEROL , PO, Take 1 tablet by mouth in the morning., Disp: , Rfl:   Past Medical History: Past Medical History:  Diagnosis Date   Anemia    Arthritis    Asthma    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   Cancer (HCC) 2012   Right Lung CA   COPD (chronic obstructive pulmonary disease) (HCC)    Depression    Diabetes mellitus without complication (HCC)    Patient takes Janumet   Essential hypertension 05/26/2016   Heart failure (HCC) 2022   Hydropneumothorax 05/03/2020   Hypercholesteremia    Oxygen  dependent    2L at nite    PAD (peripheral artery disease) 06/22/2016   Peripheral vascular disease    Personal history of radiation therapy    Shortness of breath dyspnea    with exertion    Sialolithiasis    Sleep apnea    Wears dentures    full upper and lower    Tobacco Use: Social History   Tobacco Use  Smoking Status Former   Current packs/day: 0.00   Average packs/day: 1 pack/day for 37.0 years (37.0 ttl pk-yrs)   Types: Cigarettes   Start date: 02/06/1973   Quit date:  02/06/2010   Years since quitting: 14.2  Smokeless Tobacco Former   Types: Snuff    Labs: Review Flowsheet  More data exists      Latest Ref Rng & Units 01/12/2024 01/27/2024 04/09/2024 04/18/2024 04/21/2024  Labs for ITP Cardiac and Pulmonary Rehab  Hemoglobin A1c 4.8 - 5.6 % 8.0  - - 7.2  -  Bicarbonate 20.0 - 28.0 mmol/L - 40.2  29.9  38.0  37.2  32.7   O2 Saturation % - 41.6  99.1  88.7  59.6  Details       Multiple values from one day are sorted in reverse-chronological order          Pulmonary Assessment Scores:  Pulmonary Assessment Scores     Row Name 04/13/24 1624         ADL UCSD   ADL Phase Entry     SOB Score total 71     Rest 0     Walk 3     Stairs 4     Bath 4     Dress 3     Shop 3       CAT Score   CAT Score 26       mMRC Score   mMRC Score 2        UCSD: Self-administered rating of dyspnea associated with activities of daily living (ADLs) 6-point scale (0 = not at all to 5 = maximal or unable to do because of breathlessness)  Scoring Scores range from 0 to 120.  Minimally important difference is 5 units  CAT: CAT can identify the health impairment of COPD patients and is better correlated with disease progression.  CAT has a scoring range of zero to 40. The CAT score is classified into four groups of low (less than 10), medium (10 - 20), high (21-30) and very high (31-40) based on the impact level of disease on health status. A CAT score over 10 suggests significant symptoms.  A worsening CAT score could be explained by an exacerbation, poor medication adherence, poor inhaler technique, or progression of COPD or comorbid conditions.  CAT MCID is 2 points  mMRC: mMRC (Modified Medical Research Council) Dyspnea Scale is used to assess the degree of baseline functional disability in patients of respiratory disease due to dyspnea. No minimal important difference is established. A decrease in score of 1 point or greater is considered a  positive change.   Pulmonary Function Assessment:   Exercise Target Goals: Exercise Program Goal: Individual exercise prescription set using results from initial 6 min walk test and THRR while considering  patient's activity barriers and safety.   Exercise Prescription Goal: Initial exercise prescription builds to 30-45 minutes a day of aerobic activity, 2-3 days per week.  Home exercise guidelines will be given to patient during program as part of exercise prescription that the participant will acknowledge.  Education: Aerobic Exercise: - Group verbal and visual presentation on the components of exercise prescription. Introduces F.I.T.T principle from ACSM for exercise prescriptions.  Reviews F.I.T.T. principles of aerobic exercise including progression. Written material provided at class time.   Education: Resistance Exercise: - Group verbal and visual presentation on the components of exercise prescription. Introduces F.I.T.T principle from ACSM for exercise prescriptions  Reviews F.I.T.T. principles of resistance exercise including progression. Written material provided at class time.    Education: Exercise & Equipment Safety: - Individual verbal instruction and demonstration of equipment use and safety with use of the equipment. Flowsheet Row Pulmonary Rehab from 04/13/2024 in [facility] Cardiac and Pulmonary Rehab  Date 04/13/24  Educator MB  Instruction Review Code 1- Verbalizes Understanding    Education: Exercise Physiology & General Exercise Guidelines: - Group verbal and written instruction with models to review the exercise physiology of the cardiovascular system and associated critical values. Provides general exercise guidelines with specific guidelines to those with heart or lung disease.    Education: Flexibility, Balance, Mind/Body Relaxation: - Group verbal and visual presentation with interactive activity on the components of exercise prescription. Introduces F.I.T.T  principle from ACSM for exercise prescriptions. Reviews F.I.T.T. principles of flexibility and balance exercise training including progression. Also discusses the mind body connection.  Reviews various relaxation techniques to help reduce and manage stress (i.e. Deep breathing, progressive muscle relaxation, and visualization). Balance handout provided to take home. Written material provided at class time.   Activity Barriers & Risk Stratification:  Activity Barriers & Cardiac Risk Stratification - 04/13/24 1620       Activity Barriers & Cardiac Risk Stratification   Activity Barriers Joint Problems;Balance Concerns;Other (comment);Assistive Device    Comments recent stent in L leg causing numbness          6 Minute Walk:  6 Minute Walk     Row Name 04/13/24 1615         6 Minute Walk   Phase Initial     Distance 620 feet     Walk Time 6 minutes     # of Rest Breaks 0     MPH 1.17     METS 1.97     RPE 11     Perceived Dyspnea  0     VO2 Peak 6.89     Symptoms No     Resting HR 78 bpm     Resting BP 106/60     Resting Oxygen  Saturation  92 %     Exercise Oxygen  Saturation  during 6 min walk 86 %     Max Ex. HR 96 bpm     Max Ex. BP 148/72     2 Minute Post BP 132/72       Interval HR   1 Minute HR 94     2 Minute HR 94     3 Minute HR 94     4 Minute HR 96     5 Minute HR 90     6 Minute HR 95     2 Minute Post HR 76     Interval Heart Rate? Yes       Interval Oxygen    Interval Oxygen ? Yes     Baseline Oxygen  Saturation % 92 %     1 Minute Oxygen  Saturation % 91 %     1 Minute Liters of Oxygen  4 L     2 Minute Oxygen  Saturation % 87 %     2 Minute Liters of Oxygen  4 L     3 Minute Oxygen  Saturation % 86 %     3 Minute Liters of Oxygen  4 L     4 Minute Oxygen  Saturation % 88 %     4 Minute Liters of Oxygen  4 L     5 Minute Oxygen  Saturation % 87 %     5 Minute Liters of Oxygen  4 L     6 Minute Oxygen  Saturation % 87 %     6 Minute Liters of Oxygen  4 L      2 Minute Post Oxygen  Saturation % 95 %     2 Minute Post Liters of Oxygen  4 L       Oxygen  Initial Assessment:  Oxygen  Initial Assessment - 04/07/24 1308       Home Oxygen    Home Oxygen  Device Home Concentrator    Sleep Oxygen  Prescription Continuous    Liters per minute 3.5    Home Exercise Oxygen  Prescription Continuous    Liters per minute 4    Home Resting Oxygen  Prescription Continuous    Liters per minute 4    Compliance  with Home Oxygen  Use Yes      Intervention   Short Term Goals To learn and exhibit compliance with exercise, home and travel O2 prescription;To learn and understand importance of monitoring SPO2 with pulse oximeter and demonstrate accurate use of the pulse oximeter.;To learn and understand importance of maintaining oxygen  saturations>88%;To learn and demonstrate proper pursed lip breathing techniques or other breathing techniques. ;To learn and demonstrate proper use of respiratory medications    Long  Term Goals Verbalizes importance of monitoring SPO2 with pulse oximeter and return demonstration;Exhibits compliance with exercise, home  and travel O2 prescription;Maintenance of O2 saturations>88%;Exhibits proper breathing techniques, such as pursed lip breathing or other method taught during program session;Compliance with respiratory medication;Demonstrates proper use of MDI's          Oxygen  Re-Evaluation:  Oxygen  Re-Evaluation     Row Name 05/01/24 1356             Goals/Expected Outcomes   Comments Reviewed PLB technique with pt.  Talked about how it works and it's importance in maintaining their exercise saturations.       Goals/Expected Outcomes Short: Become more profiecient at using PLB.   Long: Become independent at using PLB.          Oxygen  Discharge (Final Oxygen  Re-Evaluation):  Oxygen  Re-Evaluation - 05/01/24 1356       Goals/Expected Outcomes   Comments Reviewed PLB technique with pt.  Talked about how it works and it's  importance in maintaining their exercise saturations.    Goals/Expected Outcomes Short: Become more profiecient at using PLB.   Long: Become independent at using PLB.          Initial Exercise Prescription:  Initial Exercise Prescription - 04/13/24 1600       Date of Initial Exercise RX and Referring Provider   Date 04/13/24    Referring Provider Parris Manna, MD      Oxygen    Oxygen  Continuous    Liters 4    Maintain Oxygen  Saturation 88% or higher      Recumbant Bike   Level 1    RPM 50    Watts 15    Minutes 15    METs 1.97      NuStep   Level 1    SPM 80    Minutes 15    METs 1.97      Track   Laps 17    Minutes 15    METs 1.92      Prescription Details   Frequency (times per week) 2    Duration Progress to 30 minutes of continuous aerobic without signs/symptoms of physical distress      Intensity   THRR 40-80% of Max Heartrate 107-137    Ratings of Perceived Exertion 11-13    Perceived Dyspnea 0-4      Progression   Progression Continue to progress workloads to maintain intensity without signs/symptoms of physical distress.      Resistance Training   Training Prescription Yes    Weight 3 lb    Reps 10-15          Perform Capillary Blood Glucose checks as needed.  Exercise Prescription Changes:   Exercise Prescription Changes     Row Name 04/13/24 1600             Response to Exercise   Blood Pressure (Admit) 106/60       Blood Pressure (Exercise) 148/72       Blood Pressure (Exit) 132/72  Heart Rate (Admit) 78 bpm       Heart Rate (Exercise) 96 bpm       Heart Rate (Exit) 76 bpm       Oxygen  Saturation (Admit) 92 %       Oxygen  Saturation (Exercise) 86 %       Oxygen  Saturation (Exit) 95 %       Rating of Perceived Exertion (Exercise) 11       Perceived Dyspnea (Exercise) 0       Symptoms none       Comments results         Progression   Average METs 1.97          Exercise Comments:   Exercise Comments      Row Name 05/01/24 1352           Exercise Comments First full day of exercise!  Patient was oriented to gym and equipment including functions, settings, policies, and procedures.  Patient's individual exercise prescription and treatment plan were reviewed.  All starting workloads were established based on the results of the 6 minute walk test done at initial orientation visit.  The plan for exercise progression was also introduced and progression will be customized based on patient's performance and goals.          Exercise Goals and Review:   Exercise Goals     Row Name 04/13/24 1623             Exercise Goals   Increase Physical Activity Yes       Intervention Provide advice, education, support and counseling about physical activity/exercise needs.;Develop an individualized exercise prescription for aerobic and resistive training based on initial evaluation findings, risk stratification, comorbidities and participant's personal goals.       Expected Outcomes Short Term: Attend rehab on a regular basis to increase amount of physical activity.;Long Term: Add in home exercise to make exercise part of routine and to increase amount of physical activity.;Long Term: Exercising regularly at least 3-5 days a week.       Increase Strength and Stamina Yes       Intervention Provide advice, education, support and counseling about physical activity/exercise needs.;Develop an individualized exercise prescription for aerobic and resistive training based on initial evaluation findings, risk stratification, comorbidities and participant's personal goals.       Expected Outcomes Short Term: Increase workloads from initial exercise prescription for resistance, speed, and METs.;Short Term: Perform resistance training exercises routinely during rehab and add in resistance training at home;Long Term: Improve cardiorespiratory fitness, muscular endurance and strength as measured by increased METs and  functional capacity ( )       Able to understand and use rate of perceived exertion (RPE) scale Yes       Intervention Provide education and explanation on how to use RPE scale       Expected Outcomes Short Term: Able to use RPE daily in rehab to express subjective intensity level;Long Term:  Able to use RPE to guide intensity level when exercising independently       Able to understand and use Dyspnea scale Yes       Intervention Provide education and explanation on how to use Dyspnea scale       Expected Outcomes Long Term: Able to use Dyspnea scale to guide intensity level when exercising independently;Short Term: Able to use Dyspnea scale daily in rehab to express subjective sense of shortness of breath during exertion  Knowledge and understanding of Target Heart Rate Range (THRR) Yes       Intervention Provide education and explanation of THRR including how the numbers were predicted and where they are located for reference       Expected Outcomes Short Term: Able to state/look up THRR;Short Term: Able to use daily as guideline for intensity in rehab;Long Term: Able to use THRR to govern intensity when exercising independently       Able to check pulse independently Yes       Intervention Provide education and demonstration on how to check pulse in carotid and radial arteries.;Review the importance of being able to check your own pulse for safety during independent exercise       Expected Outcomes Short Term: Able to explain why pulse checking is important during independent exercise;Long Term: Able to check pulse independently and accurately       Understanding of Exercise Prescription Yes       Intervention Provide education, explanation, and written materials on patient's individual exercise prescription       Expected Outcomes Short Term: Able to explain program exercise prescription;Long Term: Able to explain home exercise prescription to exercise independently          Exercise  Goals Re-Evaluation :  Exercise Goals Re-Evaluation     Row Name 05/01/24 1353             Exercise Goal Re-Evaluation   Exercise Goals Review Increase Physical Activity;Able to understand and use rate of perceived exertion (RPE) scale;Knowledge and understanding of Target Heart Rate Range (THRR);Understanding of Exercise Prescription;Increase Strength and Stamina;Able to understand and use Dyspnea scale;Able to check pulse independently       Comments Reviewed RPE and dyspnea scale, THR and program prescription with pt today.  Pt voiced understanding and was given a copy of goals to take home.       Expected Outcomes Short: Use RPE daily to regulate intensity.  Long: Follow program prescription in THR.          Discharge Exercise Prescription (Final Exercise Prescription Changes):  Exercise Prescription Changes - 04/13/24 1600       Response to Exercise   Blood Pressure (Admit) 106/60    Blood Pressure (Exercise) 148/72    Blood Pressure (Exit) 132/72    Heart Rate (Admit) 78 bpm    Heart Rate (Exercise) 96 bpm    Heart Rate (Exit) 76 bpm    Oxygen  Saturation (Admit) 92 %    Oxygen  Saturation (Exercise) 86 %    Oxygen  Saturation (Exit) 95 %    Rating of Perceived Exertion (Exercise) 11    Perceived Dyspnea (Exercise) 0    Symptoms none    Comments results      Progression   Average METs 1.97          Nutrition:  Target Goals: Understanding of nutrition guidelines, daily intake of sodium 1500mg , cholesterol 200mg , calories 30% from fat and 7% or less from saturated fats, daily to have 5 or more servings of fruits and vegetables.  Education: Nutrition 1 -Group instruction provided by verbal, written material, interactive activities, discussions, models, and posters to present general guidelines for heart healthy nutrition including macronutrients, label reading, and promoting whole foods over processed counterparts. Education serves as Pensions consultant of discussion of  heart healthy eating for all. Written material provided at class time.     Education: Nutrition 2 -Group instruction provided by verbal, written material, interactive activities, discussions, models,  and posters to present general guidelines for heart healthy nutrition including sodium, cholesterol, and saturated fat. Providing guidance of habit forming to improve blood pressure, cholesterol, and body weight. Written material provided at class time.     Biometrics:  Pre Biometrics - 04/13/24 1623       Pre Biometrics   Height 4' 11.72 (1.517 m)    Weight 116 lb 3.2 oz (52.7 kg)    Waist Circumference 35 inches    Hip Circumference 37 inches    Waist to Hip Ratio 0.95 %    BMI (Calculated) 22.9    Single Leg Stand 21 seconds           Nutrition Therapy Plan and Nutrition Goals:  Nutrition Therapy & Goals - 04/13/24 1625       Nutrition Therapy   RD appointment deferred Yes      Personal Nutrition Goals   Nutrition Goal RD appointment deferred at this time      Intervention Plan   Intervention Prescribe, educate and counsel regarding individualized specific dietary modifications aiming towards targeted core components such as weight, hypertension, lipid management, diabetes, heart failure and other comorbidities.    Expected Outcomes Short Term Goal: Understand basic principles of dietary content, such as calories, fat, sodium, cholesterol and nutrients.          Nutrition Assessments:  MEDIFICTS Score Key: >=70 Need to make dietary changes  40-70 Heart Healthy Diet <= 40 Therapeutic Level Cholesterol Diet  Flowsheet Row Pulmonary Rehab from 04/13/2024 in [facility] Cardiac and Pulmonary Rehab  Picture Your Plate Total Score on Admission 61   Picture Your Plate Scores: <59 Unhealthy dietary pattern with much room for improvement. 41-50 Dietary pattern unlikely to meet recommendations for good health and room for improvement. 51-60 More healthful dietary pattern,  with some room for improvement.  >60 Healthy dietary pattern, although there may be some specific behaviors that could be improved.   Nutrition Goals Re-Evaluation:   Nutrition Goals Discharge (Final Nutrition Goals Re-Evaluation):   Psychosocial: Target Goals: Acknowledge presence or absence of significant depression and/or stress, maximize coping skills, provide positive support system. Participant is able to verbalize types and ability to use techniques and skills needed for reducing stress and depression.   Education: Stress, Anxiety, and Depression - Group verbal and visual presentation to define topics covered.  Reviews how body is impacted by stress, anxiety, and depression.  Also discusses healthy ways to reduce stress and to treat/manage anxiety and depression.  Written material provided at class time.   Education: Sleep Hygiene -Provides group verbal and written instruction about how sleep can affect your health.  Define sleep hygiene, discuss sleep cycles and impact of sleep habits. Review good sleep hygiene tips.    Initial Review & Psychosocial Screening:  Initial Psych Review & Screening - 04/07/24 1310       Initial Review   Current issues with Current Stress Concerns;Current Anxiety/Panic    Source of Stress Concerns Chronic Illness      Family Dynamics   Good Support System? Yes      Barriers   Psychosocial barriers to participate in program There are no identifiable barriers or psychosocial needs.      Screening Interventions   Interventions Encouraged to exercise;Provide feedback about the scores to participant    Expected Outcomes Short Term goal: Utilizing psychosocial counselor, staff and physician to assist with identification of specific Stressors or current issues interfering with healing process. Setting desired goal for each  stressor or current issue identified.;Long Term Goal: Stressors or current issues are controlled or eliminated.;Short Term goal:  Identification and review with participant of any Quality of Life or Depression concerns found by scoring the questionnaire.;Long Term goal: The participant improves quality of Life and PHQ9 Scores as seen by post scores and/or verbalization of changes          Quality of Life Scores:  Scores of 19 and below usually indicate a poorer quality of life in these areas.  A difference of  2-3 points is a clinically meaningful difference.  A difference of 2-3 points in the total score of the Quality of Life Index has been associated with significant improvement in overall quality of life, self-image, physical symptoms, and general health in studies assessing change in quality of life.  PHQ-9: Review Flowsheet  More data exists      04/13/2024 03/28/2024 10/13/2023 08/12/2023 10/08/2022  Depression screen PHQ 2/9  Decreased Interest 0 0 0 0 0  Down, Depressed, Hopeless 1 0 0 0 0  PHQ - 2 Score 1 0 0 0 0  Altered sleeping 3 - - - -  Tired, decreased energy 2 - - - -  Change in appetite 3 - - - -  Feeling bad or failure about yourself  0 - - - -  Trouble concentrating 0 - - - -  Moving slowly or fidgety/restless 0 - - - -  Suicidal thoughts 0 - - - -  PHQ-9 Score 9 - - - -  Difficult doing work/chores Very difficult - - - -   Interpretation of Total Score  Total Score Depression Severity:  1-4 = Minimal depression, 5-9 = Mild depression, 10-14 = Moderate depression, 15-19 = Moderately severe depression, 20-27 = Severe depression   Psychosocial Evaluation and Intervention:  Psychosocial Evaluation - 04/07/24 1320       Psychosocial Evaluation & Interventions   Interventions Encouraged to exercise with the program and follow exercise prescription;Stress management education;Relaxation education    Comments Ms. Raiven is coming to pulmonary rehab with COPD. She states her breathing difficulty depends on the day, but she uses her oxygen  consistently. She mentions her anxiety kicks in when she gets  real short of breath and xanax  helps with that. Her stress is related to her disease process. She states she has a good support system and is looking forward to the program    Expected Outcomes Short: attend pulmonary rehab for education and exercise Long: develop and maintain positive self care habits    Continue Psychosocial Services  Follow up required by staff          Psychosocial Re-Evaluation:   Psychosocial Discharge (Final Psychosocial Re-Evaluation):   Education: Education Goals: Education classes will be provided on a weekly basis, covering required topics. Participant will state understanding/return demonstration of topics presented.  Learning Barriers/Preferences:  Learning Barriers/Preferences - 04/07/24 1310       Learning Barriers/Preferences   Learning Barriers None    Learning Preferences Individual Instruction          General Pulmonary Education Topics:  Infection Prevention: - Provides verbal and written material to individual with discussion of infection control including proper hand washing and proper equipment cleaning during exercise session. Flowsheet Row Pulmonary Rehab from 04/13/2024 in [facility] Cardiac and Pulmonary Rehab  Date 04/13/24  Educator MB  Instruction Review Code 1- Verbalizes Understanding    Falls Prevention: - Provides verbal and written material to individual with discussion of falls prevention  and safety. Flowsheet Row Pulmonary Rehab from 04/13/2024 in [facility] Cardiac and Pulmonary Rehab  Date 04/13/24  Educator MB  Instruction Review Code 1- Verbalizes Understanding    Chronic Lung Disease Review: - Group verbal instruction with posters, models, PowerPoint presentations and videos,  to review new updates, new respiratory medications, new advancements in procedures and treatments. Providing information on websites and 800 numbers for continued self-education. Includes information about supplement oxygen , available portable oxygen   systems, continuous and intermittent flow rates, oxygen  safety, concentrators, and Medicare reimbursement for oxygen . Explanation of Pulmonary Drugs, including class, frequency, complications, importance of spacers, rinsing mouth after steroid MDI's, and proper cleaning methods for nebulizers. Review of basic lung anatomy and physiology related to function, structure, and complications of lung disease. Review of risk factors. Discussion about methods for diagnosing sleep apnea and types of masks and machines for OSA. Includes a review of the use of types of environmental controls: home humidity, furnaces, filters, dust mite/pet prevention, HEPA vacuums. Discussion about weather changes, air quality and the benefits of nasal washing. Instruction on Warning signs, infection symptoms, calling MD promptly, preventive modes, and value of vaccinations. Review of effective airway clearance, coughing and/or vibration techniques. Emphasizing that all should Create an Action Plan. Written material provided at class time. Flowsheet Row Pulmonary Rehab from 04/13/2024 in [facility] Cardiac and Pulmonary Rehab  Education need identified 04/13/24    AED/CPR: - Group verbal and written instruction with the use of models to demonstrate the basic use of the AED with the basic ABC's of resuscitation.    Tests and Procedures:  - Group verbal and visual presentation and models provide information about basic cardiac anatomy and function. Reviews the testing methods done to diagnose heart disease and the outcomes of the test results. Describes the treatment choices: Medical Management, Angioplasty, or Coronary Bypass Surgery for treating various heart conditions including Myocardial Infarction, Angina, Valve Disease, and Cardiac Arrhythmias.  Written material provided at class time.   Medication Safety: - Group verbal and visual instruction to review commonly prescribed medications for heart and lung disease. Reviews the  medication, class of the drug, and side effects. Includes the steps to properly store meds and maintain the prescription regimen.  Written material given at graduation.   Other: -Provides group and verbal instruction on various topics (see comments)   Knowledge Questionnaire Score:  Knowledge Questionnaire Score - 04/13/24 1626       Knowledge Questionnaire Score   Pre Score 16/18           Core Components/Risk Factors/Patient Goals at Admission:  Personal Goals and Risk Factors at Admission - 04/13/24 1626       Core Components/Risk Factors/Patient Goals on Admission    Weight Management Yes;Weight Maintenance    Intervention Weight Management: Develop a combined nutrition and exercise program designed to reach desired caloric intake, while maintaining appropriate intake of nutrient and fiber, sodium and fats, and appropriate energy expenditure required for the weight goal.;Weight Management: Provide education and appropriate resources to help participant work on and attain dietary goals.;Weight Management/Obesity: Establish reasonable short term and long term weight goals.    Admit Weight 116 lb 3.2 oz (52.7 kg)    Goal Weight: Short Term 11 lb 3.2 oz (5.08 kg)    Goal Weight: Long Term 116 lb 3.2 oz (52.7 kg)    Expected Outcomes Short Term: Continue to assess and modify interventions until short term weight is achieved;Long Term: Adherence to nutrition and physical activity/exercise program aimed toward  attainment of established weight goal;Weight Maintenance: Understanding of the daily nutrition guidelines, which includes 25-35% calories from fat, 7% or less cal from saturated fats, less than 200mg  cholesterol, less than 1.5gm of sodium, & 5 or more servings of fruits and vegetables daily;Understanding recommendations for meals to include 15-35% energy as protein, 25-35% energy from fat, 35-60% energy from carbohydrates, less than 200mg  of dietary cholesterol, 20-35 gm of total fiber  daily;Understanding of distribution of calorie intake throughout the day with the consumption of 4-5 meals/snacks    Diabetes Yes    Intervention Provide education about signs/symptoms and action to take for hypo/hyperglycemia.;Provide education about proper nutrition, including hydration, and aerobic/resistive exercise prescription along with prescribed medications to achieve blood glucose in normal ranges: Fasting glucose 65-99 mg/dL    Expected Outcomes Long Term: Attainment of HbA1C < 7%.;Short Term: Participant verbalizes understanding of the signs/symptoms and immediate care of hyper/hypoglycemia, proper foot care and importance of medication, aerobic/resistive exercise and nutrition plan for blood glucose control.    Hypertension Yes    Intervention Provide education on lifestyle modifcations including regular physical activity/exercise, weight management, moderate sodium restriction and increased consumption of fresh fruit, vegetables, and low fat dairy, alcohol moderation, and smoking cessation.;Monitor prescription use compliance.    Expected Outcomes Short Term: Continued assessment and intervention until BP is < 140/62mm HG in hypertensive participants. < 130/87mm HG in hypertensive participants with diabetes, heart failure or chronic kidney disease.;Long Term: Maintenance of blood pressure at goal levels.          Education:Diabetes - Individual verbal and written instruction to review signs/symptoms of diabetes, desired ranges of glucose level fasting, after meals and with exercise. Acknowledge that pre and post exercise glucose checks will be done for 3 sessions at entry of program. Flowsheet Row Pulmonary Rehab from 04/13/2024 in [facility] Cardiac and Pulmonary Rehab  Date 04/13/24  Educator MB  Instruction Review Code 1- Verbalizes Understanding    Know Your Numbers and Heart Failure: - Group verbal and visual instruction to discuss disease risk factors for cardiac and pulmonary  disease and treatment options.  Reviews associated critical values for Overweight/Obesity, Hypertension, Cholesterol, and Diabetes.  Discusses basics of heart failure: signs/symptoms and treatments.  Introduces Heart Failure Zone chart for action plan for heart failure. Written material provided at class time.   Core Components/Risk Factors/Patient Goals Review:    Core Components/Risk Factors/Patient Goals at Discharge (Final Review):    ITP Comments:  ITP Comments     Row Name 04/07/24 1318 04/13/24 1615 05/01/24 1352 05/10/24 1407     ITP Comments Initial phone call completed. Diagnosis can be found in [facility] 7/23. EP Orientation scheduled for Thursday 8/28 at 2pm. Completed and gym orientation for pulmonary rehab. Initial ITP created and sent for review to Dr. Faud Aleskerov, Medical Director. First full day of exercise!  Patient was oriented to gym and equipment including functions, settings, policies, and procedures.  Patient's individual exercise prescription and treatment plan were reviewed.  All starting workloads were established based on the results of the 6 minute walk test done at initial orientation visit.  The plan for exercise progression was also introduced and progression will be customized based on patient's performance and goals. 30 Day review completed. Medical Director ITP review done; changes made as directed and signed approval by Medical Director. New to program.       Comments: 30 day review

## 2024-05-12 MED ORDER — LOSARTAN POTASSIUM 25 MG PO TABS: 25.0000 mg | ORAL_TABLET | Freq: Every day | ORAL | 3 refills | Status: DC

## 2024-05-12 NOTE — Telephone Encounter (Signed)
Tried to call patient, no voice mailbox set up.

## 2024-05-15 ENCOUNTER — Encounter

## 2024-05-15 MED ORDER — LOSARTAN POTASSIUM 50 MG PO TABS: 50.0000 mg | ORAL_TABLET | Freq: Every day | ORAL | 1 refills | Status: DC

## 2024-05-15 NOTE — Telephone Encounter (Signed)
 Patient was notified and also was told to let us  know if her blood pressure is still high after a couple of days on this new increased dose.

## 2024-05-17 ENCOUNTER — Encounter: Attending: Pulmonary Disease | Admitting: Emergency Medicine

## 2024-05-17 DIAGNOSIS — J449 Chronic obstructive pulmonary disease, unspecified: Secondary | ICD-10-CM | POA: Insufficient documentation

## 2024-05-17 NOTE — Progress Notes (Signed)
 Daily Session Note  Patient Details  Name: Angela Jensen MRN: [id number] Date of Birth: 1956-03-02 Referring Provider:   Conrad Ports Pulmonary Rehab from 04/13/2024 in [facility] Cardiac and Pulmonary Rehab  Referring Provider Parris Manna, MD    Encounter Date: 05/17/2024  Check In:  Session Check In - 05/17/24 1356       Check-In   Supervising physician immediately available to respond to emergencies See telemetry face sheet for immediately available ER MD    Location [facility]-Cardiac & Pulmonary Rehab    Staff Present Devaughn Jaeger, BS, Exercise Physiologist;Kelly Dyane HECKLE, ACSM CEP, Exercise Physiologist;Kelly Bollinger RN,BSN,MPA;Chaquana Nichols RN,BSN    Virtual Visit No    Medication changes reported     No    Fall or balance concerns reported    No    Warm-up and Cool-down Performed on first and last piece of equipment    Resistance Training Performed Yes    VAD Patient? No    PAD/SET Patient? No      Pain Assessment   Currently in Pain? No/denies             Social History   Tobacco Use  Smoking Status Former   Current packs/day: 0.00   Average packs/day: 1 pack/day for 37.0 years (37.0 ttl pk-yrs)   Types: Cigarettes   Start date: 02/06/1973   Quit date: 02/06/2010   Years since quitting: 14.2  Smokeless Tobacco Former   Types: Snuff    Goals Met:  Proper associated with RPD/PD & O2 Sat Independence with exercise equipment Using PLB without cueing & demonstrates good technique Exercise tolerated well No report of concerns or symptoms today Strength training completed today  Goals Unmet:  Not Applicable  Comments: Pt able to follow exercise prescription today without complaint.  Will continue to monitor for progression.    Dr. Oneil Pinal is Medical Director for [facility] Cardiac Rehabilitation.  Dr. Fuad Aleskerov is Medical Director for [facility] Pulmonary Rehabilitation.

## 2024-05-22 ENCOUNTER — Encounter

## 2024-05-22 DIAGNOSIS — I700 Atherosclerosis of aorta: Secondary | ICD-10-CM | POA: Diagnosis not present

## 2024-05-22 DIAGNOSIS — I739 Peripheral vascular disease, unspecified: Secondary | ICD-10-CM | POA: Diagnosis not present

## 2024-05-22 DIAGNOSIS — Z794 Long term (current) use of insulin: Secondary | ICD-10-CM | POA: Diagnosis not present

## 2024-05-22 DIAGNOSIS — Z950 Presence of cardiac pacemaker: Secondary | ICD-10-CM | POA: Diagnosis not present

## 2024-05-22 DIAGNOSIS — E782 Mixed hyperlipidemia: Secondary | ICD-10-CM | POA: Diagnosis not present

## 2024-05-22 DIAGNOSIS — G4730 Sleep apnea, unspecified: Secondary | ICD-10-CM | POA: Diagnosis not present

## 2024-05-22 DIAGNOSIS — I495 Sick sinus syndrome: Secondary | ICD-10-CM | POA: Diagnosis not present

## 2024-05-22 DIAGNOSIS — E119 Type 2 diabetes mellitus without complications: Secondary | ICD-10-CM | POA: Diagnosis not present

## 2024-05-22 DIAGNOSIS — J4489 Other specified chronic obstructive pulmonary disease: Secondary | ICD-10-CM | POA: Diagnosis not present

## 2024-05-22 DIAGNOSIS — I10 Essential (primary) hypertension: Secondary | ICD-10-CM | POA: Diagnosis not present

## 2024-05-22 DIAGNOSIS — I5032 Chronic diastolic (congestive) heart failure: Secondary | ICD-10-CM | POA: Diagnosis not present

## 2024-05-22 DIAGNOSIS — I442 Atrioventricular block, complete: Secondary | ICD-10-CM | POA: Diagnosis not present

## 2024-05-23 ENCOUNTER — Ambulatory Visit (INDEPENDENT_AMBULATORY_CARE_PROVIDER_SITE_OTHER): Admitting: Nurse Practitioner

## 2024-05-23 ENCOUNTER — Encounter: Payer: Self-pay | Admitting: Nurse Practitioner

## 2024-05-23 VITALS — BP 135/82 | HR 75 | Temp 96.6°F | Resp 16 | Ht 59.0 in | Wt 116.8 lb

## 2024-05-23 DIAGNOSIS — K219 Gastro-esophageal reflux disease without esophagitis: Secondary | ICD-10-CM | POA: Diagnosis not present

## 2024-05-23 DIAGNOSIS — M79605 Pain in left leg: Secondary | ICD-10-CM

## 2024-05-23 DIAGNOSIS — I739 Peripheral vascular disease, unspecified: Secondary | ICD-10-CM

## 2024-05-23 MED ORDER — OXYCODONE-ACETAMINOPHEN 7.5-325 MG PO TABS: 1.0000 | ORAL_TABLET | Freq: Four times a day (QID) | ORAL | 0 refills | Status: DC | PRN

## 2024-05-23 MED ORDER — PANTOPRAZOLE SODIUM 40 MG PO TBEC: 40.0000 mg | DELAYED_RELEASE_TABLET | Freq: Every day | ORAL | 3 refills | Status: AC

## 2024-05-23 NOTE — Progress Notes (Signed)
 [facility] [address] [place], [place] [address]  Internal MEDICINE  Office Visit Note  Patient Name: Angela Jensen  [id number]  [id number]  Date of Service: 05/23/2024  Chief Complaint  Patient presents with   Depression   Diabetes   Hyperlipidemia   Hypertension   Follow-up    HPI Angela Jensen presents for a follow-up visit for left leg pain  --Severe left leg pain, constant, has become gradually worse since her vascular surgery on her left leg done in may. Concern for occlusion of a stent or a new blockage causing worsening of pain. The pain intensifies further with standing and walking.  --has PVD and is already established with vascular surgery.  -- GERD -- taking pantoprazole  daily     Current Medication: Outpatient Encounter Medications as of 05/23/2024  Medication Sig Note   [DISCONTINUED] oxyCODONE -acetaminophen  (PERCOCET) 7.5-325 MG tablet Take 1 tablet by mouth every 6 (six) hours as needed for severe pain (pain score 7-10).    Accu-Chek Softclix Lancets lancets Use 1 lancet 3 times daily to check glucose for diabetes    albuterol  (PROVENTIL ) (5 MG/ML) 0.5% nebulizer solution Take 2.5 mg by nebulization every 6 (six) hours as needed for wheezing or shortness of breath.    albuterol  (VENTOLIN  HFA) 108 (90 Base) MCG/ACT inhaler INHALE 2 PUFFS BY MOUTH EVERY 6 HOURS AS NEEDED FOR WHEEZING OR SHORTNESS OF BREATH    aspirin  EC 81 MG tablet Take 1 tablet (81 mg total) by mouth daily. Swallow whole.    atorvastatin  (LIPITOR) 10 MG tablet Take 1 tablet (10 mg total) by mouth daily.    Blood Glucose Monitoring Suppl (ACCU-CHEK GUIDE) w/Device KIT Use as directed Dx e11.65    budesonide -glycopyrrolate -formoterol  (BREZTRI ) 160-9-4.8 MCG/ACT AERO inhaler Inhale 2 puffs into the lungs 2 (two) times daily.    calcium  carbonate (OSCAL) 1500 (600 Ca) MG TABS tablet Take 600 mg of elemental calcium  by mouth 2 (two) times daily with a meal.    clopidogrel  (PLAVIX ) 75 MG tablet  Take 1 tablet (75 mg total) by mouth daily.    cyclobenzaprine  (FLEXERIL ) 10 MG tablet Take 1 tablet (10 mg total) by mouth at bedtime. Take one tab po qhs for back spasm prn only    ferrous sulfate  325 (65 FE) MG tablet Take 1 tablet (325 mg total) by mouth daily with breakfast.    furosemide  (LASIX ) 20 MG tablet Take 1 tablet (20 mg total) by mouth daily as needed.    insulin  glargine, 1 Unit Dial , (TOUJEO ) 300 UNIT/ML Solostar Pen Inject 10 Units into the skin daily. 12/23/2023: Taking differently: 8-10 units daily   Insulin  Pen Needle (RELION PEN NEEDLES) 31G X 6 MM MISC USE 1  pen needle with insulin  pen ONCE DAILY for diabetes    ipratropium-albuterol  (DUONEB) 0.5-2.5 (3) MG/3ML SOLN USE 1 VIAL IN NEBULIZER EVERY 6 HOURS AS NEEDED    losartan  (COZAAR ) 50 MG tablet Take 1 tablet (50 mg total) by mouth daily.    metFORMIN  (GLUCOPHAGE -XR) 500 MG 24 hr tablet TAKE 2 TABLETS BY MOUTH ONCE DAILY WITH BREAKFAST    metoprolol  succinate (TOPROL -XL) 50 MG 24 hr tablet Take 1 tablet (50 mg total) by mouth daily. Take with or immediately following a meal. May take 1/2 tablet if BP is less than 120/80.    OXYGEN  Inhale 4 L into the lungs. PT USES [facility] FOR OXYGEN  12/23/2023: Taking 3 L   pantoprazole  (PROTONIX ) 40 MG tablet Take 1 tablet (40 mg  total) by mouth daily.    potassium chloride  (KLOR-CON ) 10 MEQ tablet TAKE 1 TABLET BY MOUTH EVERY OTHER DAY    vitamin B-12 (CYANOCOBALAMIN ) 100 MCG tablet Take 100 mcg by mouth daily.    VITAMIN D , CHOLECALCIFEROL , PO Take 1 tablet by mouth in the morning.    [DISCONTINUED] ACCU-CHEK GUIDE TEST test strip USE TO TEST BLOOD SUGAR 5 TIMES DAILY    [DISCONTINUED] ALPRAZolam  (XANAX ) 1 MG tablet Take 1 tablet (1 mg total) by mouth 2 (two) times daily as needed for anxiety or sleep.    [DISCONTINUED] escitalopram  (LEXAPRO ) 10 MG tablet Take 1 tablet (10 mg total) by mouth daily.    [DISCONTINUED] oxyCODONE -acetaminophen  (PERCOCET/ROXICET) 5-325 MG tablet Take 1  tablet by mouth every 6 (six) hours as needed for severe pain (pain score 7-10).    [DISCONTINUED] pantoprazole  (PROTONIX ) 40 MG tablet Take 1 tablet (40 mg total) by mouth daily.    [DISCONTINUED] pregabalin  (LYRICA ) 75 MG capsule Take 1 capsule (75 mg total) by mouth 2 (two) times daily.    No facility-administered encounter medications on file as of 05/23/2024.    Surgical History: Past Surgical History:  Procedure Laterality Date   APPLICATION OF CELL SAVER Left 01/21/2024   Procedure: APPLICATION OF CELL SAVER;  Surgeon: Marea Selinda RAMAN, MD;  Location: [facility];  Service: Vascular;  Laterality: Left;   CESAREAN SECTION     x3   COLONOSCOPY N/A 04/03/2024   Procedure: COLONOSCOPY;  Surgeon: Jinny Carmine, MD;  Location: [facility];  Service: Endoscopy;  Laterality: N/A;   COLONOSCOPY WITH PROPOFOL  N/A 06/25/2015   Procedure: COLONOSCOPY WITH PROPOFOL ;  Surgeon: Carmine Jinny, MD;  Location: [facility];  Service: Endoscopy;  Laterality: N/A;   COLONOSCOPY WITH PROPOFOL  N/A 07/26/2020   Procedure: COLONOSCOPY WITH PROPOFOL ;  Surgeon: Jinny Carmine, MD;  Location: [facility];  Service: Endoscopy;  Laterality: N/A;   CYST REMOVAL LEG     and on shoulder    ENDARTERECTOMY FEMORAL Left 01/21/2024   Procedure: LEFT COMMON, SUPERFICIAL FEMORAL AND PROFUNDA FEMORIS ENDARTERECTOMY WITH BOVINE PERICARDIAL PATCH ANGIOPLASTY;  Surgeon: Marea Selinda RAMAN, MD;  Location: [facility];  Service: Vascular;  Laterality: Left;   ESOPHAGOGASTRODUODENOSCOPY (EGD) WITH PROPOFOL  N/A 07/26/2020   Procedure: ESOPHAGOGASTRODUODENOSCOPY (EGD) WITH PROPOFOL ;  Surgeon: Jinny Carmine, MD;  Location: [facility];  Service: Endoscopy;  Laterality: N/A;  Diabetic - oral meds   INSERTION OF ILIAC STENT Left 01/21/2024   Procedure: INSERTION, STENT, ARTERY, ILIAC;  Surgeon: Marea Selinda RAMAN, MD;  Location: [facility];  Service: Vascular;  Laterality: Left;  AND SFA STENTS   LOWER EXTREMITY ANGIOGRAPHY Left 09/29/2018    Procedure: LOWER EXTREMITY ANGIOGRAPHY;  Surgeon: Marea Selinda RAMAN, MD;  Location: [facility];  Service: Cardiovascular;  Laterality: Left;   LOWER EXTREMITY ANGIOGRAPHY Left 07/29/2023   Procedure: Lower Extremity Angiography;  Surgeon: Marea Selinda RAMAN, MD;  Location: [facility];  Service: Cardiovascular;  Laterality: Left;   LOWER EXTREMITY ANGIOGRAPHY Right 12/20/2023   Procedure: Lower Extremity Angiography;  Surgeon: Marea Selinda RAMAN, MD;  Location: [facility];  Service: Cardiovascular;  Laterality: Right;   LOWER EXTREMITY ANGIOGRAPHY Left 01/17/2024   Procedure: Lower Extremity Angiography;  Surgeon: Marea Selinda RAMAN, MD;  Location: [facility];  Service: Cardiovascular;  Laterality: Left;   LUNG BIOPSY  12/30/2011   has lung spots   PACEMAKER IMPLANT  07/14/2021   PACEMAKER LEADLESS INSERTION N/A 07/14/2021   Procedure: PACEMAKER LEADLESS  INSERTION;  Surgeon: Ammon Blunt, MD;  Location: [facility];  Service: Cardiovascular;  Laterality: N/A;   PERIPHERAL VASCULAR CATHETERIZATION Left 06/01/2016   Procedure: Lower Extremity Angiography;  Surgeon: Selinda GORMAN Gu, MD;  Location: [facility];  Service: Cardiovascular;  Laterality: Left;   PERIPHERAL VASCULAR CATHETERIZATION N/A 06/01/2016   Procedure: Abdominal Aortogram w/Lower Extremity;  Surgeon: Selinda GORMAN Gu, MD;  Location: [facility];  Service: Cardiovascular;  Laterality: N/A;   PERIPHERAL VASCULAR CATHETERIZATION  06/01/2016   Procedure: Lower Extremity Intervention;  Surgeon: Selinda GORMAN Gu, MD;  Location: [facility];  Service: Cardiovascular;;   PERIPHERAL VASCULAR CATHETERIZATION Right 06/08/2016   Procedure: Lower Extremity Angiography;  Surgeon: Selinda GORMAN Gu, MD;  Location: [facility];  Service: Cardiovascular;  Laterality: Right;   PERIPHERAL VASCULAR CATHETERIZATION  06/08/2016   Procedure: Lower Extremity Intervention;  Surgeon: Selinda GORMAN Gu, MD;   Location: [facility];  Service: Cardiovascular;;   POLYPECTOMY  04/03/2024   Procedure: POLYPECTOMY, INTESTINE;  Surgeon: Jinny Carmine, MD;  Location: [facility];  Service: Endoscopy;;   SUBMANDIBULAR GLAND EXCISION Left 12/06/2020   Procedure: EXCISION SUBMANDIBULAR GLAND;  Surgeon: Herminio Miu, MD;  Location: [facility];  Service: ENT;  Laterality: Left;  needs to be first case Diabetic - diet controlled   TEMPORARY PACEMAKER N/A 07/11/2021   Procedure: TEMPORARY PACEMAKER;  Surgeon: Ammon Blunt, MD;  Location: [facility];  Service: Cardiovascular;  Laterality: N/A;    Medical History: Past Medical History:  Diagnosis Date   Anemia    Arthritis    Asthma    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   Cancer (HCC) 2012   Right Lung CA   COPD (chronic obstructive pulmonary disease) (HCC)    Depression    Diabetes mellitus without complication (HCC)    Patient takes Janumet   Essential hypertension 05/26/2016   Heart failure (HCC) 2022   Hydropneumothorax 05/03/2020   Hypercholesteremia    Oxygen  dependent    2L at nite    PAD (peripheral artery disease) 06/22/2016   Peripheral vascular disease    Personal history of radiation therapy    Shortness of breath dyspnea    with exertion    Sialolithiasis    Sleep apnea    Wears dentures    full upper and lower    Family History: Family History  Problem Relation Age of Onset   Diabetes Mother    Hypercholesterolemia Mother    Lung cancer Father    Diabetes Sister    Diabetes Sister    Hypertension Sister    Diabetes Maternal Grandmother    Diabetes Paternal Grandmother    Heart attack Brother    Coronary artery disease Brother    Vascular Disease Brother    Heart attack Brother    Breast cancer Neg Hx     Social History   Socioeconomic History   Marital status: Widowed    Spouse name: Not on file   Number of children:  Not on file   Years of education: Not on file   Highest education level: Not on file  Occupational History   Not on file  Tobacco Use   Smoking status: Former    Current packs/day: 0.00    Average packs/day: 1 pack/day for 37.0 years (37.0 ttl pk-yrs)    Types: Cigarettes    Start date: 02/06/1973    Quit date: 02/06/2010  Years since quitting: 14.3   Smokeless tobacco: Former    Types: Snuff  Vaping Use   Vaping status: Never Used  Substance and Sexual Activity   Alcohol use: Not Currently    Alcohol/week: 5.0 standard drinks of alcohol    Types: 5 Cans of beer per week    Comment: /h x of alcohol abuse -stopped 2012- now drinks 5 beer per week   Drug use: Not Currently    Types: Marijuana, Crack cocaine, Cocaine    Comment: hx of cocaine use- last use 2015; last use marijuana6/22/19,   Sexual activity: Yes  Other Topics Concern   Not on file  Social History Narrative   Lives with Significant Other x 43 years   Social Drivers of Corporate Investment Banker Strain: Medium Risk (06/04/2024)   Received from [facility]   Overall Financial Resource Strain (CARDIA)    How hard is it for you to pay for the very basics like food, housing, medical care, and heating?: Somewhat hard  Food Insecurity: No Food Insecurity (06/04/2024)   Received from [facility]   Hunger Vital Sign    Within the past 12 months, you worried that your food would run out before you got the money to buy more.: Never true    Within the past 12 months, the food you bought just didn't last and you didn't have money to get more.: Never true  Transportation Needs: No Transportation Needs (06/04/2024)   Received from [facility] - Transportation    Lack of Transportation (Medical): No    Lack of Transportation (Non-Medical): No  Physical Activity: Not on file  Stress: Not on file  Social Connections: Moderately Integrated (04/18/2024)   Social Connection and Isolation Panel     Frequency of Communication with Friends and Family: More than three times a week    Frequency of Social Gatherings with Friends and Family: Three times a week    Attends Religious Services: More than 4 times per year    Active Member of Clubs or Organizations: No    Attends Banker Meetings: Never    Marital Status: Living with partner  Intimate Partner Violence: Not At Risk (04/18/2024)   Humiliation, Afraid, Rape, and Kick questionnaire    Fear of Current or Ex-Partner: No    Emotionally Abused: No    Physically Abused: No    Sexually Abused: No      Review of Systems  Constitutional:  Negative for chills, fatigue and unexpected weight change.  HENT:  Negative for congestion, rhinorrhea, sneezing and sore throat.   Eyes:  Negative for redness.  Respiratory:  Positive for cough (intermittent), shortness of breath (intermittent) and wheezing (intermittent). Negative for chest tightness.   Cardiovascular:  Positive for leg swelling. Negative for chest pain and palpitations.  Gastrointestinal: Negative.  Negative for abdominal pain, constipation, diarrhea, nausea and vomiting.  Genitourinary:  Negative for dysuria and frequency.  Musculoskeletal:  Positive for arthralgias and back pain. Negative for joint swelling and neck pain.  Skin:  Negative for rash.  Neurological:  Positive for numbness. Negative for tremors.  Hematological:  Negative for adenopathy. Does not bruise/bleed easily.  Psychiatric/Behavioral:  Negative for behavioral problems (Depression), sleep disturbance and suicidal ideas. The patient is not nervous/anxious.     Vital Signs: BP 135/82 Comment: 172/96  Pulse 75   Temp (!) 96.6 F (35.9 C)   Resp 16   Ht 4' 11 (1.499  m)   Wt 116 lb 12.8 oz (53 kg)   SpO2 93% Comment: 4L  BMI 23.59 kg/m    Physical Exam Vitals reviewed.  Constitutional:      General: She is not in acute distress.    Appearance: Normal appearance. She is normal weight. She  is not ill-appearing.  HENT:     Head: Normocephalic and atraumatic.  Eyes:     Pupils: Pupils are equal, round, and reactive to light.  Cardiovascular:     Rate and Rhythm: Normal rate and regular rhythm.  Pulmonary:     Effort: Pulmonary effort is normal. No respiratory distress.     Breath sounds: Normal breath sounds. No wheezing.  Neurological:     Mental Status: She is alert and oriented to person, place, and time.  Psychiatric:        Mood and Affect: Mood normal.        Behavior: Behavior normal.        Assessment/Plan: 1. Left leg pain (Primary) Continue percocet as needed. Patient to follow up with vascular surgery next week   2. PVD (peripheral vascular disease) Follow up with vascular surgery for worsening claudication.   3. Gastroesophageal reflux disease without esophagitis Continue pantoprazole  as prescribed.  - pantoprazole  (PROTONIX ) 40 MG tablet; Take 1 tablet (40 mg total) by mouth daily.  Dispense: 90 tablet; Refill: 3   General Counseling: Angela Jensen verbalizes understanding of the findings of todays visit and agrees with plan of treatment. I have discussed any further diagnostic evaluation that may be needed or ordered today. We also reviewed her medications today. she has been encouraged to call the office with any questions or concerns that should arise related to todays visit.    No orders of the defined types were placed in this encounter.   Meds ordered this encounter  Medications   pantoprazole  (PROTONIX ) 40 MG tablet    Sig: Take 1 tablet (40 mg total) by mouth daily.    Dispense:  90 tablet    Refill:  3    Fill new script today   DISCONTD: oxyCODONE -acetaminophen  (PERCOCET) 7.5-325 MG tablet    Sig: Take 1 tablet by mouth every 6 (six) hours as needed for severe pain (pain score 7-10).    Dispense:  30 tablet    Refill:  0    Fill new script today, note increased dose, discontinue the 5 mg percocet.    Return in about 1 month (around  06/23/2024) for F/U, Angela Jensen PCP.   Total time spent:30 Minutes Time spent includes review of chart, medications, test results, and follow up plan with the patient.   [place] Controlled Substance Database was reviewed by me.  This patient was seen by Mardy Maxin, FNP-C in collaboration with Dr. Sigrid Bathe as a part of collaborative care agreement.   Rilynne Lonsway R. Maxin, MSN, FNP-C Internal medicine

## 2024-05-24 ENCOUNTER — Encounter: Admitting: Emergency Medicine

## 2024-05-24 ENCOUNTER — Telehealth (INDEPENDENT_AMBULATORY_CARE_PROVIDER_SITE_OTHER): Payer: Self-pay | Admitting: Vascular Surgery

## 2024-05-24 DIAGNOSIS — J449 Chronic obstructive pulmonary disease, unspecified: Secondary | ICD-10-CM | POA: Diagnosis not present

## 2024-05-24 NOTE — Progress Notes (Signed)
 Daily Session Note  Patient Details  Name: Angela Jensen MRN: [id number] Date of Birth: 06-Oct-1955 Referring Provider:   [facility] from 04/13/2024 in [facility]  Referring Provider Parris Manna, MD    Encounter Date: 05/24/2024  Check In:  Session Check In - 05/24/24 1409       Check-In   Supervising physician immediately available to respond to emergencies See telemetry face sheet for immediately available ER MD    Location [facility]    Staff Present Devaughn Jaeger, BS, Exercise Physiologist;Kelly Dyane HECKLE, ACSM CEP, Exercise Physiologist;Shermaine Rivet RN,BSN;Kelly Bollinger [facility]    Virtual Visit No    Medication changes reported     No    Fall or balance concerns reported    No    Warm-up and Cool-down Performed on first and last piece of equipment    Resistance Training Performed Yes    VAD Patient? No    PAD/SET Patient? No      Pain Assessment   Currently in Pain? No/denies             Social History   Tobacco Use  Smoking Status Former   Current packs/day: 0.00   Average packs/day: 1 pack/day for 37.0 years (37.0 ttl pk-yrs)   Types: Cigarettes   Start date: 02/06/1973   Quit date: 02/06/2010   Years since quitting: 14.3  Smokeless Tobacco Former   Types: Snuff    Goals Met:  Proper associated with RPD/PD & O2 Sat Independence with exercise equipment Using PLB without cueing & demonstrates good technique Exercise tolerated well No report of concerns or symptoms today Strength training completed today  Goals Unmet:  Not Applicable  Comments: Pt able to follow exercise prescription today without complaint.  Will continue to monitor for progression.    Dr. Oneil Pinal is Medical Director for [facility].  Dr. Fuad Aleskerov is Medical Director for [facility].

## 2024-05-24 NOTE — Telephone Encounter (Signed)
 ATC pt to schedule for ABI + consult. No answer and no VM set up to LM.   per staff message from jd - bring in with ABI. see jd/fb/bp - severe leg pain

## 2024-05-25 ENCOUNTER — Other Ambulatory Visit (INDEPENDENT_AMBULATORY_CARE_PROVIDER_SITE_OTHER): Payer: Self-pay | Admitting: Vascular Surgery

## 2024-05-25 DIAGNOSIS — Z9889 Other specified postprocedural states: Secondary | ICD-10-CM

## 2024-05-26 ENCOUNTER — Encounter (INDEPENDENT_AMBULATORY_CARE_PROVIDER_SITE_OTHER)

## 2024-05-29 ENCOUNTER — Other Ambulatory Visit (INDEPENDENT_AMBULATORY_CARE_PROVIDER_SITE_OTHER): Payer: Self-pay | Admitting: Vascular Surgery

## 2024-05-29 ENCOUNTER — Encounter

## 2024-05-29 DIAGNOSIS — J9610 Chronic respiratory failure, unspecified whether with hypoxia or hypercapnia: Secondary | ICD-10-CM | POA: Diagnosis not present

## 2024-05-29 DIAGNOSIS — J449 Chronic obstructive pulmonary disease, unspecified: Secondary | ICD-10-CM | POA: Diagnosis not present

## 2024-05-29 DIAGNOSIS — Z9889 Other specified postprocedural states: Secondary | ICD-10-CM

## 2024-05-30 DIAGNOSIS — I495 Sick sinus syndrome: Secondary | ICD-10-CM | POA: Diagnosis not present

## 2024-05-31 ENCOUNTER — Ambulatory Visit (INDEPENDENT_AMBULATORY_CARE_PROVIDER_SITE_OTHER)

## 2024-05-31 ENCOUNTER — Encounter

## 2024-05-31 DIAGNOSIS — Z9889 Other specified postprocedural states: Secondary | ICD-10-CM

## 2024-05-31 DIAGNOSIS — I739 Peripheral vascular disease, unspecified: Secondary | ICD-10-CM

## 2024-06-01 ENCOUNTER — Encounter (INDEPENDENT_AMBULATORY_CARE_PROVIDER_SITE_OTHER): Payer: Self-pay | Admitting: Vascular Surgery

## 2024-06-01 ENCOUNTER — Ambulatory Visit (INDEPENDENT_AMBULATORY_CARE_PROVIDER_SITE_OTHER): Admitting: Vascular Surgery

## 2024-06-01 VITALS — BP 169/96 | HR 92 | Resp 18 | Ht 59.0 in | Wt 118.4 lb

## 2024-06-01 DIAGNOSIS — I70222 Atherosclerosis of native arteries of extremities with rest pain, left leg: Secondary | ICD-10-CM

## 2024-06-01 DIAGNOSIS — I10 Essential (primary) hypertension: Secondary | ICD-10-CM

## 2024-06-01 DIAGNOSIS — E0800 Diabetes mellitus due to underlying condition with hyperosmolarity without nonketotic hyperglycemic-hyperosmolar coma (NKHHC): Secondary | ICD-10-CM

## 2024-06-01 DIAGNOSIS — Z794 Long term (current) use of insulin: Secondary | ICD-10-CM

## 2024-06-01 DIAGNOSIS — E785 Hyperlipidemia, unspecified: Secondary | ICD-10-CM

## 2024-06-01 LAB — VAS US ABI WITH/WO TBI
Left ABI: 1.03
Right ABI: 0.9

## 2024-06-01 NOTE — Progress Notes (Signed)
 Subjective:    [name], female    DOB: February 13, 1956, 68 y.o.   MRN: [id number] Chief Complaint  Patient presents with   Follow-up     ABI. - severe leg pain  Hospitalized in Sept for breathing issies    The patient returns to the office for followup and review status post left femoral endarterectomy with intervention on 01/21/2024.      PROCEDURE: 1.Left common femoral, superficial femoral and profunda femoris endarterectomy with bovine pericardial patch angioplasty. 2.            Stent placement to the left external iliac artery with 8 mm diameter by 7.5 cm length Viabahn stent 3.            Mechanical thrombectomy to the left SFA and popliteal arteries with the 8 [place] Rex device 4.           Stent placement to proximal left SFA with 7 mm diameter by 10 cm length Viabahn stent 5.          Stent placement to the left most distal SFA and popliteal artery with 6 mm diameter by 15 cm length Viabahn stent 6.           Angioplasty of the left tibioperoneal trunk and posterior tibial arteries with 2.5 mm diameter angioplasty balloon proximally and then the entirety of the left posterior tibial artery with 2 mm diameter angioplasty: 7.           Mechanical thrombectomy of the left tibioperoneal trunk and posterior tibial arteries with the penumbra 6 bolt device 8.          Instillation of 4 mg of tPA in the left posterior tibial artery distally for residual thrombus after above procedures     The patient endorses increased pain in her lower extremity symptoms. Patient endorses claudication symptoms worsening decreasing her ability to ambulate any amount of distance with rest pain symptoms. No new ulcers or wounds have occurred since the last visit.  She does still have some neuropathic pain going on in her left lower extremity however the patient's significant swelling has improved.  She endorses continued to have some numbness and pain in her left lower extremity.  The  patient does have some underlying neuropathy.  Her wounds are completely healed today.       Review of Systems  Constitutional: Negative.   Cardiovascular:  Positive for leg swelling.       Home and portable oxygen   Neurological:  Positive for numbness.  All other systems reviewed and are negative.      Objective:   Physical Exam Vitals reviewed.  Constitutional:      Appearance: Normal appearance. She is normal weight.  HENT:     Head: Normocephalic.  Eyes:     Pupils: Pupils are equal, round, and reactive to light.  Cardiovascular:     Rate and Rhythm: Normal rate and regular rhythm.     Pulses:          Dorsalis pedis pulses are 2+ on the right side and detected w/ Doppler on the left side.     Heart sounds: Normal heart sounds.  Pulmonary:     Effort: Pulmonary effort is normal.     Breath sounds: Normal breath sounds.  Abdominal:     General: Abdomen is flat. Bowel sounds are normal.     Palpations: Abdomen is soft.  Musculoskeletal:        General:  Tenderness present.     Right lower leg: No edema.     Left lower leg: No edema.  Skin:    General: Skin is warm and dry.     Capillary Refill: Capillary refill takes more than 3 seconds. In the left lower extremity.  Neurological:     General: No focal deficit present.     Mental Status: She is alert and oriented to person, place, and time. Mental status is at baseline.  Psychiatric:        Mood and Affect: Mood normal.        Behavior: Behavior normal.        Thought Content: Thought content normal.        Judgment: Judgment normal.     BP (!) 169/96 Comment: pt reports she has been running high lately  Pulse 92   Resp 18   Ht 4' 11 (1.499 m)   Wt 118 lb 6.4 oz (53.7 kg)   BMI 23.91 kg/m   Past Medical History:  Diagnosis Date   Anemia    Arthritis    Asthma    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   Cancer (HCC) 2012   Right Lung CA   COPD  (chronic obstructive pulmonary disease) (HCC)    Depression    Diabetes mellitus without complication (HCC)    Patient takes Janumet   Essential hypertension 05/26/2016   Heart failure (HCC) 2022   Hydropneumothorax 05/03/2020   Hypercholesteremia    Oxygen  dependent    2L at nite    PAD (peripheral artery disease) 06/22/2016   Peripheral vascular disease    Personal history of radiation therapy    Shortness of breath dyspnea    with exertion    Sialolithiasis    Sleep apnea    Wears dentures    full upper and lower    Social History   Socioeconomic History   Marital status: Widowed    Spouse name: Not on file   Number of children: Not on file   Years of education: Not on file   Highest education level: Not on file  Occupational History   Not on file  Tobacco Use   Smoking status: Former    Current packs/day: 0.00    Average packs/day: 1 pack/day for 37.0 years (37.0 ttl pk-yrs)    Types: Cigarettes    Start date: 02/06/1973    Quit date: 02/06/2010    Years since quitting: 14.3   Smokeless tobacco: Former    Types: Snuff  Vaping Use   Vaping status: Never Used  Substance and Sexual Activity   Alcohol use: Not Currently    Alcohol/week: 5.0 standard drinks of alcohol    Types: 5 Cans of beer per week    Comment: /h x of alcohol abuse -stopped 2012- now drinks 5 beer per week   Drug use: Not Currently    Types: Marijuana, Crack cocaine, Cocaine    Comment: hx of cocaine use- last use 2015; last use marijuana6/22/19,   Sexual activity: Yes  Other Topics Concern   Not on file  Social History Narrative   Lives with Significant Other x 43 years   Social Drivers of Corporate investment banker Strain: Not on file  Food Insecurity: Patient Declined (04/18/2024)   Hunger Vital Sign    Worried About Running Out of Food in the Last Year: Patient declined    Ran Out of Food in the Last Year:  Patient declined  Transportation Needs: No Transportation Needs (04/18/2024)    PRAPARE - Administrator, Civil Service (Medical): No    Lack of Transportation (Non-Medical): No  Physical Activity: Not on file  Stress: Not on file  Social Connections: Moderately Integrated (04/18/2024)   Social Connection and Isolation Panel    Frequency of Communication with Friends and Family: More than three times a week    Frequency of Social Gatherings with Friends and Family: Three times a week    Attends Religious Services: More than 4 times per year    Active Member of Clubs or Organizations: No    Attends Banker Meetings: Never    Marital Status: Living with partner  Intimate Partner Violence: Not At Risk (04/18/2024)   Humiliation, Afraid, Rape, and Kick questionnaire    Fear of Current or Ex-Partner: No    Emotionally Abused: No    Physically Abused: No    Sexually Abused: No    Past Surgical History:  Procedure Laterality Date   APPLICATION OF CELL SAVER Left 01/21/2024   Procedure: APPLICATION OF CELL SAVER;  Surgeon: Marea Selinda RAMAN, MD;  Location: [facility];  Service: Vascular;  Laterality: Left;   CESAREAN SECTION     x3   COLONOSCOPY N/A 04/03/2024   Procedure: COLONOSCOPY;  Surgeon: Jinny Carmine, MD;  Location: [facility];  Service: Endoscopy;  Laterality: N/A;   COLONOSCOPY WITH PROPOFOL  N/A 06/25/2015   Procedure: COLONOSCOPY WITH PROPOFOL ;  Surgeon: Carmine Jinny, MD;  Location: [facility];  Service: Endoscopy;  Laterality: N/A;   COLONOSCOPY WITH PROPOFOL  N/A 07/26/2020   Procedure: COLONOSCOPY WITH PROPOFOL ;  Surgeon: Jinny Carmine, MD;  Location: [facility];  Service: Endoscopy;  Laterality: N/A;   CYST REMOVAL LEG     and on shoulder    ENDARTERECTOMY FEMORAL Left 01/21/2024   Procedure: LEFT COMMON, SUPERFICIAL FEMORAL AND PROFUNDA FEMORIS ENDARTERECTOMY WITH BOVINE PERICARDIAL PATCH ANGIOPLASTY;  Surgeon: Marea Selinda RAMAN, MD;  Location: [facility];  Service: Vascular;  Laterality: Left;   ESOPHAGOGASTRODUODENOSCOPY (EGD)  WITH PROPOFOL  N/A 07/26/2020   Procedure: ESOPHAGOGASTRODUODENOSCOPY (EGD) WITH PROPOFOL ;  Surgeon: Jinny Carmine, MD;  Location: [facility];  Service: Endoscopy;  Laterality: N/A;  Diabetic - oral meds   INSERTION OF ILIAC STENT Left 01/21/2024   Procedure: INSERTION, STENT, ARTERY, ILIAC;  Surgeon: Marea Selinda RAMAN, MD;  Location: [facility];  Service: Vascular;  Laterality: Left;  AND SFA STENTS   LOWER EXTREMITY ANGIOGRAPHY Left 09/29/2018   Procedure: LOWER EXTREMITY ANGIOGRAPHY;  Surgeon: Marea Selinda RAMAN, MD;  Location: [facility];  Service: Cardiovascular;  Laterality: Left;   LOWER EXTREMITY ANGIOGRAPHY Left 07/29/2023   Procedure: Lower Extremity Angiography;  Surgeon: Marea Selinda RAMAN, MD;  Location: [facility];  Service: Cardiovascular;  Laterality: Left;   LOWER EXTREMITY ANGIOGRAPHY Right 12/20/2023   Procedure: Lower Extremity Angiography;  Surgeon: Marea Selinda RAMAN, MD;  Location: [facility];  Service: Cardiovascular;  Laterality: Right;   LOWER EXTREMITY ANGIOGRAPHY Left 01/17/2024   Procedure: Lower Extremity Angiography;  Surgeon: Marea Selinda RAMAN, MD;  Location: [facility];  Service: Cardiovascular;  Laterality: Left;   LUNG BIOPSY  12/30/2011   has lung spots   PACEMAKER IMPLANT  07/14/2021   PACEMAKER LEADLESS INSERTION N/A 07/14/2021   Procedure: PACEMAKER LEADLESS INSERTION;  Surgeon: Ammon Blunt, MD;  Location: [facility];  Service: Cardiovascular;  Laterality: N/A;   PERIPHERAL VASCULAR CATHETERIZATION Left 06/01/2016  Procedure: Lower Extremity Angiography;  Surgeon: Selinda GORMAN Gu, MD;  Location: [facility];  Service: Cardiovascular;  Laterality: Left;   PERIPHERAL VASCULAR CATHETERIZATION N/A 06/01/2016   Procedure: Abdominal Aortogram w/Lower Extremity;  Surgeon: Selinda GORMAN Gu, MD;  Location: [facility];  Service: Cardiovascular;  Laterality: N/A;   PERIPHERAL VASCULAR CATHETERIZATION  06/01/2016    Procedure: Lower Extremity Intervention;  Surgeon: Selinda GORMAN Gu, MD;  Location: [facility];  Service: Cardiovascular;;   PERIPHERAL VASCULAR CATHETERIZATION Right 06/08/2016   Procedure: Lower Extremity Angiography;  Surgeon: Selinda GORMAN Gu, MD;  Location: [facility];  Service: Cardiovascular;  Laterality: Right;   PERIPHERAL VASCULAR CATHETERIZATION  06/08/2016   Procedure: Lower Extremity Intervention;  Surgeon: Selinda GORMAN Gu, MD;  Location: [facility];  Service: Cardiovascular;;   POLYPECTOMY  04/03/2024   Procedure: POLYPECTOMY, INTESTINE;  Surgeon: Jinny Carmine, MD;  Location: [facility];  Service: Endoscopy;;   SUBMANDIBULAR GLAND EXCISION Left 12/06/2020   Procedure: EXCISION SUBMANDIBULAR GLAND;  Surgeon: Herminio Miu, MD;  Location: [facility];  Service: ENT;  Laterality: Left;  needs to be first case Diabetic - diet controlled   TEMPORARY PACEMAKER N/A 07/11/2021   Procedure: TEMPORARY PACEMAKER;  Surgeon: Ammon Blunt, MD;  Location: [facility];  Service: Cardiovascular;  Laterality: N/A;    Family History  Problem Relation Age of Onset   Diabetes Mother    Hypercholesterolemia Mother    Lung cancer Father    Diabetes Sister    Diabetes Sister    Hypertension Sister    Diabetes Maternal Grandmother    Diabetes Paternal Grandmother    Heart attack Brother    Coronary artery disease Brother    Vascular Disease Brother    Heart attack Brother    Breast cancer Neg Hx     Allergies  Allergen Reactions   Iodinated Contrast Media Itching   Trelegy Ellipta  [Fluticasone -Umeclidin-Vilant]     Had breathing issues   Bactoshield Chg [Chlorhexidine  Gluconate] Itching and Other (See Comments)    Dry. Flaking, peeling skin       Latest Ref Rng & Units 04/19/2024    4:13 AM 04/18/2024    6:22 AM 04/12/2024    3:12 AM  CBC  WBC 4.0 - 10.5 K/uL 8.1  9.6  7.9   Hemoglobin 12.0 - 15.0 g/dL 89.1  88.9  9.4   Hematocrit 36.0 -  46.0 % 35.0  36.5  30.4   Platelets 150 - 400 K/uL 263  195  261       CMP     Component Value Date/Time   NA 140 04/19/2024 0413   NA 144 10/04/2023 1047   NA 141 04/17/2014 1055   K 4.4 04/19/2024 0413   K 3.9 04/17/2014 1055   CL 91 (L) 04/19/2024 0413   CL 102 04/17/2014 1055   CO2 32 04/19/2024 0413   CO2 30 04/17/2014 1055   GLUCOSE 290 (H) 04/19/2024 0413   GLUCOSE 201 (H) 04/17/2014 1055   BUN 38 (H) 04/19/2024 0413   BUN 12 10/04/2023 1047   BUN 10 04/17/2014 1055   CREATININE 0.84 04/19/2024 0413   CREATININE 0.84 07/06/2023 1016   CREATININE 0.90 04/17/2014 1055   CALCIUM  9.1 04/19/2024 0413   CALCIUM  9.1 04/17/2014 1055   PROT 6.6 04/18/2024 0622   PROT 6.9 10/04/2023 1047   PROT 8.1 04/17/2014 1055   ALBUMIN 3.5 04/18/2024 0622   ALBUMIN 4.4 10/04/2023  1047   ALBUMIN 4.1 04/17/2014 1055   AST 45 (H) 04/18/2024 0622   AST 24 07/06/2023 1016   ALT 38 04/18/2024 0622   ALT 27 07/06/2023 1016   ALT 29 04/17/2014 1055   ALKPHOS 68 04/18/2024 0622   ALKPHOS 73 04/17/2014 1055   BILITOT 0.6 04/18/2024 0622   BILITOT 0.2 10/04/2023 1047   BILITOT 0.4 07/06/2023 1016   EGFR 97 10/04/2023 1047   GFRNONAA >60 04/19/2024 0413   GFRNONAA >60 07/06/2023 1016   GFRNONAA >60 04/17/2014 1055     VAS US  ABI WITH/WO TBI Result Date: 06/01/2024  LOWER EXTREMITY DOPPLER STUDY Patient Name:  CYNCERE RUHE  Date of Exam:   05/31/2024 Medical Rec #: [id number]     Accession #:    [id number] Date of Birth: [birth year]     Patient Gender: F Patient Age:   57 years Exam Location:  [facility] Procedure:      VAS US  ABI WITH/WO TBI Referring Phys: Selinda Gu --------------------------------------------------------------------------------  Indications: Claudication, and peripheral artery disease. Other Factors: Left SFA stents are known to be occluded.                Patient complains of constant numbness in left foot.                 07/29/2023: Aortogram and selective  Left Lower                ExtremityAngiogram. Mechanical thrombectomy of the Left SFA and                Popliteal Artery with the [place] Rex device. Stent placement to the                Proximal Left SFA with 6 mm diameter by 5 cm length Viabahn                stent. Stent placement to the Left Popliteal Artery above the                knee with 6 mm diameter by 5 cm length Viabahn stent.                 12/20/2023: Aortogram and Selective Right Lower                ExtremityAngiogram. PTA of the Right SFA with 4 mm diameter by 22                cm length Lutonix drug coated angioplasty balloon. Stent                placement to the Right SFA with 6 mm by 20 cm length Life stent.  Vascular Interventions: 06/01/16: Left distal SFA/popliteal arteries PTAs with                         SFA stent x2;                         06/08/16: Left SFA PTA;                         09/29/18: Left SFA stent x2;                          01/17/2024: Aortogram and Selective Left Lower Extremity  Angiogram.                          01/21/2024: Left CFA,PFA and SFA Endartectomies and                         patch angioplasty. Stent placement to the Left EIA with                         8 mm diameter by 7.5 cm length Viabahn stent. Mechanical                         thrombectomy to the Left SFA and Popliteal Artery with                         the 8 [place] Rex device. Stent placement to                         proximal Left SFA with 7 mm diameter by 10 cm length                         Viabahn stent. Stent placement to the Left most distal                         SFA and Popliteal Artery with 6 mm diameter by 15 cm                         length Viabahn stent. Angioplasty of the Left                         Tibioperoneal trunk and PTA with 2.5 mm diameter                         angioplasty balloon proximally and then the entirety of                         the Left Posterior Tibial Artery with 2 mm diameter                          diameter angioplasty. Mechanical Thrombectomy of the                         Left Tibioperoneal trunk and Posterior Tibial Arteries                         withe the penumbra 6 bolt device. Instillation of 4 mg                         of TPA in the Left Posteriot tibial Artery distally for                         residual thrombus after above procedures. Comparison Study: 02/21/2024 Performing Technologist: Leafy Gibes RVS  Examination Guidelines: A complete evaluation includes at minimum, Doppler waveform signals and systolic blood pressure reading at the level of bilateral brachial, anterior tibial, and posterior tibial arteries, when vessel segments are accessible. Bilateral testing  is considered an integral part of a complete examination. Photoelectric Plethysmograph (PPG) waveforms and toe systolic pressure readings are included as required and additional duplex testing as needed. Limited examinations for reoccurring indications may be performed as noted.  ABI Findings: +---------+------------------+-----+---------+--------+ Right    Rt Pressure (mmHg)IndexWaveform Comment  +---------+------------------+-----+---------+--------+ Brachial 131                                      +---------+------------------+-----+---------+--------+ ATA      94                0.72 biphasic          +---------+------------------+-----+---------+--------+ PTA      118               0.90 triphasic         +---------+------------------+-----+---------+--------+ Great Toe122               0.93 Normal            +---------+------------------+-----+---------+--------+ +---------+------------------+-----+---------+-------+ Left     Lt Pressure (mmHg)IndexWaveform Comment +---------+------------------+-----+---------+-------+ Brachial 124                                     +---------+------------------+-----+---------+-------+ ATA      113               0.86 biphasic          +---------+------------------+-----+---------+-------+ PTA      0                 0.00 absent           +---------+------------------+-----+---------+-------+ PERO     135               1.03 triphasic        +---------+------------------+-----+---------+-------+ Great Toe146               1.11 Normal           +---------+------------------+-----+---------+-------+ +-------+-----------+-----------+------------+------------+ ABI/TBIToday's ABIToday's TBIPrevious ABIPrevious TBI +-------+-----------+-----------+------------+------------+ Right  .90        .93        .92         .99          +-------+-----------+-----------+------------+------------+ Left   1.03       1.11       .92         .90          +-------+-----------+-----------+------------+------------+ Left ABIs and TBIs appear increased compared to prior study on 02/21/2024. Right ABIs appear increased compared to prior study on 02/21/2024. Rt TBIs appear to be decreased compared to preior study on 02/21/2024.  Summary: Right: Resting right ankle-brachial index indicates mild right lower extremity arterial disease. The right toe-brachial index is normal.  Left: Resting left ankle-brachial index is within normal range. The left toe-brachial index is normal.  *See table(s) above for measurements and observations.  Electronically signed by Selinda Gu MD on 06/01/2024 at 11:52:48 AM.    Final        Assessment & Plan:   1. Atherosclerosis of native artery of left lower extremity with rest pain (HCC) (Primary) Recommend:  The patient has experienced increased claudication symptoms and is now describing lifestyle limiting claudication and appears to be having mild rest pain symptroms.  Given the severity of the patient's  severe left lower extremity symptoms the patient should undergo angiography with the hope for intervention.  Risk and benefits were reviewed the patient.  Indications for the procedure were  reviewed.  All questions were answered, the patient agrees to proceed with left lower extremity angiography and possible intervention.   The patient should continue walking and begin a more formal exercise program.  The patient should continue antiplatelet therapy and aggressive treatment of the lipid abnormalities  The patient will follow up with me after the angiogram.   2. Essential hypertension Continue antihypertensive medications as already ordered, these medications have been reviewed and there are no changes at this time.  3. Diabetes mellitus due to underlying condition with hyperosmolarity without coma, with long-term current use of insulin  (HCC) Continue hypoglycemic medications as already ordered, these medications have been reviewed and there are no changes at this time.  Hgb A1C to be monitored as already arranged by primary service  4. Hyperlipidemia, unspecified hyperlipidemia type Continue statin as ordered and reviewed, no changes at this time   Current Outpatient Medications on File Prior to Visit  Medication Sig Dispense Refill   ACCU-CHEK GUIDE TEST test strip USE TO TEST BLOOD SUGAR 5 TIMES DAILY 450 each 0   Accu-Chek Softclix Lancets lancets Use 1 lancet 3 times daily to check glucose for diabetes 300 each 1   albuterol  (PROVENTIL ) (5 MG/ML) 0.5% nebulizer solution Take 2.5 mg by nebulization every 6 (six) hours as needed for wheezing or shortness of breath.     albuterol  (VENTOLIN  HFA) 108 (90 Base) MCG/ACT inhaler INHALE 2 PUFFS BY MOUTH EVERY 6 HOURS AS NEEDED FOR WHEEZING OR SHORTNESS OF BREATH 3 each 5   ALPRAZolam  (XANAX ) 1 MG tablet Take 1 tablet (1 mg total) by mouth 2 (two) times daily as needed for anxiety or sleep. 60 tablet 2   aspirin  EC 81 MG tablet Take 1 tablet (81 mg total) by mouth daily. Swallow whole. 150 tablet 2   atorvastatin  (LIPITOR) 10 MG tablet Take 1 tablet (10 mg total) by mouth daily. 30 tablet 11   Blood Glucose Monitoring Suppl  (ACCU-CHEK GUIDE) w/Device KIT Use as directed Dx e11.65 1 kit 0   budesonide -glycopyrrolate -formoterol  (BREZTRI ) 160-9-4.8 MCG/ACT AERO inhaler Inhale 2 puffs into the lungs 2 (two) times daily. 10.7 g 11   calcium  carbonate (OSCAL) 1500 (600 Ca) MG TABS tablet Take 600 mg of elemental calcium  by mouth 2 (two) times daily with a meal.     clopidogrel  (PLAVIX ) 75 MG tablet Take 1 tablet (75 mg total) by mouth daily. 90 tablet 3   cyclobenzaprine  (FLEXERIL ) 10 MG tablet Take 1 tablet (10 mg total) by mouth at bedtime. Take one tab po qhs for back spasm prn only 30 tablet 3   escitalopram  (LEXAPRO ) 10 MG tablet Take 1 tablet (10 mg total) by mouth daily. 90 tablet 1   ferrous sulfate  325 (65 FE) MG tablet Take 1 tablet (325 mg total) by mouth daily with breakfast.     furosemide  (LASIX ) 20 MG tablet Take 1 tablet (20 mg total) by mouth daily as needed. 30 tablet 5   insulin  glargine, 1 Unit Dial , (TOUJEO ) 300 UNIT/ML Solostar Pen Inject 10 Units into the skin daily. 1.5 mL 11   Insulin  Pen Needle (RELION PEN NEEDLES) 31G X 6 MM MISC USE 1  pen needle with insulin  pen ONCE DAILY for diabetes 50 each 11   ipratropium-albuterol  (DUONEB) 0.5-2.5 (3) MG/3ML SOLN USE 1 VIAL IN NEBULIZER EVERY  6 HOURS AS NEEDED 360 mL 0   losartan  (COZAAR ) 50 MG tablet Take 1 tablet (50 mg total) by mouth daily. 90 tablet 1   metFORMIN  (GLUCOPHAGE -XR) 500 MG 24 hr tablet TAKE 2 TABLETS BY MOUTH ONCE DAILY WITH BREAKFAST 180 tablet 0   metoprolol  succinate (TOPROL -XL) 50 MG 24 hr tablet Take 1 tablet (50 mg total) by mouth daily. Take with or immediately following a meal. May take 1/2 tablet if BP is less than 120/80. 90 tablet 3   oxyCODONE -acetaminophen  (PERCOCET) 7.5-325 MG tablet Take 1 tablet by mouth every 6 (six) hours as needed for severe pain (pain score 7-10). 30 tablet 0   OXYGEN  Inhale 4 L into the lungs. PT USES [facility] FOR OXYGEN      pantoprazole  (PROTONIX ) 40 MG tablet Take 1 tablet (40 mg total) by mouth  daily. 90 tablet 3   potassium chloride  (KLOR-CON ) 10 MEQ tablet TAKE 1 TABLET BY MOUTH EVERY OTHER DAY 45 tablet 0   pregabalin  (LYRICA ) 75 MG capsule Take 1 capsule (75 mg total) by mouth 2 (two) times daily. 60 capsule 2   vitamin B-12 (CYANOCOBALAMIN ) 100 MCG tablet Take 100 mcg by mouth daily.     VITAMIN D , CHOLECALCIFEROL , PO Take 1 tablet by mouth in the morning.     No current facility-administered medications on file prior to visit.    There are no Patient Instructions on file for this visit. No follow-ups on file.   Angela JONELLE Shank, NP

## 2024-06-03 DIAGNOSIS — I5030 Unspecified diastolic (congestive) heart failure: Secondary | ICD-10-CM | POA: Diagnosis not present

## 2024-06-03 DIAGNOSIS — K219 Gastro-esophageal reflux disease without esophagitis: Secondary | ICD-10-CM | POA: Diagnosis not present

## 2024-06-03 DIAGNOSIS — J701 Chronic and other pulmonary manifestations due to radiation: Secondary | ICD-10-CM | POA: Diagnosis not present

## 2024-06-03 DIAGNOSIS — I110 Hypertensive heart disease with heart failure: Secondary | ICD-10-CM | POA: Diagnosis not present

## 2024-06-03 DIAGNOSIS — J9610 Chronic respiratory failure, unspecified whether with hypoxia or hypercapnia: Secondary | ICD-10-CM | POA: Diagnosis not present

## 2024-06-03 DIAGNOSIS — J9601 Acute respiratory failure with hypoxia: Secondary | ICD-10-CM | POA: Diagnosis not present

## 2024-06-03 DIAGNOSIS — J9622 Acute and chronic respiratory failure with hypercapnia: Secondary | ICD-10-CM | POA: Diagnosis not present

## 2024-06-03 DIAGNOSIS — Z7902 Long term (current) use of antithrombotics/antiplatelets: Secondary | ICD-10-CM | POA: Diagnosis not present

## 2024-06-03 DIAGNOSIS — R0609 Other forms of dyspnea: Secondary | ICD-10-CM | POA: Diagnosis not present

## 2024-06-03 DIAGNOSIS — R0602 Shortness of breath: Secondary | ICD-10-CM | POA: Diagnosis not present

## 2024-06-03 DIAGNOSIS — J441 Chronic obstructive pulmonary disease with (acute) exacerbation: Secondary | ICD-10-CM | POA: Diagnosis not present

## 2024-06-03 DIAGNOSIS — Z7982 Long term (current) use of aspirin: Secondary | ICD-10-CM | POA: Diagnosis not present

## 2024-06-03 DIAGNOSIS — Z7984 Long term (current) use of oral hypoglycemic drugs: Secondary | ICD-10-CM | POA: Diagnosis not present

## 2024-06-03 DIAGNOSIS — G4733 Obstructive sleep apnea (adult) (pediatric): Secondary | ICD-10-CM | POA: Diagnosis not present

## 2024-06-03 DIAGNOSIS — J9612 Chronic respiratory failure with hypercapnia: Secondary | ICD-10-CM | POA: Diagnosis not present

## 2024-06-03 DIAGNOSIS — I495 Sick sinus syndrome: Secondary | ICD-10-CM | POA: Diagnosis not present

## 2024-06-03 DIAGNOSIS — J449 Chronic obstructive pulmonary disease, unspecified: Secondary | ICD-10-CM | POA: Diagnosis not present

## 2024-06-03 DIAGNOSIS — I5032 Chronic diastolic (congestive) heart failure: Secondary | ICD-10-CM | POA: Diagnosis not present

## 2024-06-03 DIAGNOSIS — J440 Chronic obstructive pulmonary disease with acute lower respiratory infection: Secondary | ICD-10-CM | POA: Diagnosis not present

## 2024-06-03 DIAGNOSIS — Z950 Presence of cardiac pacemaker: Secondary | ICD-10-CM | POA: Diagnosis not present

## 2024-06-03 DIAGNOSIS — R042 Hemoptysis: Secondary | ICD-10-CM | POA: Diagnosis not present

## 2024-06-03 DIAGNOSIS — E119 Type 2 diabetes mellitus without complications: Secondary | ICD-10-CM | POA: Diagnosis not present

## 2024-06-03 DIAGNOSIS — R0600 Dyspnea, unspecified: Secondary | ICD-10-CM | POA: Diagnosis not present

## 2024-06-03 DIAGNOSIS — J9621 Acute and chronic respiratory failure with hypoxia: Secondary | ICD-10-CM | POA: Diagnosis not present

## 2024-06-03 DIAGNOSIS — E785 Hyperlipidemia, unspecified: Secondary | ICD-10-CM | POA: Diagnosis not present

## 2024-06-03 DIAGNOSIS — Z794 Long term (current) use of insulin: Secondary | ICD-10-CM | POA: Diagnosis not present

## 2024-06-03 DIAGNOSIS — E1165 Type 2 diabetes mellitus with hyperglycemia: Secondary | ICD-10-CM | POA: Diagnosis not present

## 2024-06-03 DIAGNOSIS — I4510 Unspecified right bundle-branch block: Secondary | ICD-10-CM | POA: Diagnosis not present

## 2024-06-03 DIAGNOSIS — Q278 Other specified congenital malformations of peripheral vascular system: Secondary | ICD-10-CM | POA: Diagnosis not present

## 2024-06-03 DIAGNOSIS — R918 Other nonspecific abnormal finding of lung field: Secondary | ICD-10-CM | POA: Diagnosis not present

## 2024-06-03 DIAGNOSIS — E1151 Type 2 diabetes mellitus with diabetic peripheral angiopathy without gangrene: Secondary | ICD-10-CM | POA: Diagnosis not present

## 2024-06-05 ENCOUNTER — Encounter

## 2024-06-06 ENCOUNTER — Ambulatory Visit (INDEPENDENT_AMBULATORY_CARE_PROVIDER_SITE_OTHER): Admitting: Nurse Practitioner

## 2024-06-06 ENCOUNTER — Telehealth (INDEPENDENT_AMBULATORY_CARE_PROVIDER_SITE_OTHER): Payer: Self-pay

## 2024-06-06 ENCOUNTER — Encounter (INDEPENDENT_AMBULATORY_CARE_PROVIDER_SITE_OTHER)

## 2024-06-06 NOTE — Telephone Encounter (Signed)
 Spoke with the patient and she is hospitalized and cannot schedule a procedure for a LLE angio with Dr. Marea. Patient stated she will call.

## 2024-06-07 ENCOUNTER — Encounter

## 2024-06-07 ENCOUNTER — Encounter: Payer: Self-pay | Admitting: *Deleted

## 2024-06-07 DIAGNOSIS — J449 Chronic obstructive pulmonary disease, unspecified: Secondary | ICD-10-CM

## 2024-06-07 NOTE — Progress Notes (Signed)
 Pulmonary Individual Treatment Plan  Patient Details  Name: Angela Jensen MRN: [id number] Date of Birth: 04/15/1956 Referring Provider:   Conrad Ports Pulmonary Rehab from 04/13/2024 in [facility] Card[facility] and Pul[facility] Rehab  Referring Provider Parris Manna, MD    Initial Encounter Date:  Flowsheet Row Pulmonary Rehab from 04/13/2024 in [facility] Cardiac and Pulmonary Rehab  Date 04/13/24    Visit Diagnosis: Stage 4 very severe COPD by GOLD classification (HCC)  Patient's Home Medications on Admission:  Current Outpatient Medications:    ACCU-CHEK GUIDE TEST test strip, USE TO TEST BLOOD SUGAR 5 TIMES DAILY, Disp: 450 each, Rfl: 0   Accu-Chek Softclix Lancets lancets, Use 1 lancet 3 times daily to check glucose for diabetes, Disp: 300 each, Rfl: 1   albuterol  (PROVENTIL ) (5 MG/ML) 0.5% nebulizer solution, Take 2.5 mg by nebulization every 6 (six) hours as needed for wheezing or shortness of breath., Disp: , Rfl:    albuterol  (VENTOLIN  HFA) 108 (90 Base) MCG/ACT inhaler, INHALE 2 PUFFS BY MOUTH EVERY 6 HOURS AS NEEDED FOR WHEEZING OR SHORTNESS OF BREATH, Disp: 3 each, Rfl: 5   ALPRAZolam  (XANAX ) 1 MG tablet, Take 1 tablet (1 mg total) by mouth 2 (two) times daily as needed for anxiety or sleep., Disp: 60 tablet, Rfl: 2   aspirin  EC 81 MG tablet, Take 1 tablet (81 mg total) by mouth daily. Swallow whole., Disp: 150 tablet, Rfl: 2   atorvastatin  (LIPITOR) 10 MG tablet, Take 1 tablet (10 mg total) by mouth daily., Disp: 30 tablet, Rfl: 11   Blood Glucose Monitoring Suppl (ACCU-CHEK GUIDE) w/Device KIT, Use as directed Dx e11.65, Disp: 1 kit, Rfl: 0   budesonide -glycopyrrolate -formoterol  (BREZTRI ) 160-9-4.8 MCG/ACT AERO inhaler, Inhale 2 puffs into the lungs 2 (two) times daily., Disp: 10.7 g, Rfl: 11   calcium  carbonate (OSCAL) 1500 (600 Ca) MG TABS tablet, Take 600 mg of elemental calcium  by mouth 2 (two) times daily with a meal., Disp: , Rfl:    clopidogrel  (PLAVIX ) 75 MG tablet, Take 1 tablet (75  mg total) by mouth daily., Disp: 90 tablet, Rfl: 3   cyclobenzaprine  (FLEXERIL ) 10 MG tablet, Take 1 tablet (10 mg total) by mouth at bedtime. Take one tab po qhs for back spasm prn only, Disp: 30 tablet, Rfl: 3   escitalopram  (LEXAPRO ) 10 MG tablet, Take 1 tablet (10 mg total) by mouth daily., Disp: 90 tablet, Rfl: 1   ferrous sulfate  325 (65 FE) MG tablet, Take 1 tablet (325 mg total) by mouth daily with breakfast., Disp: , Rfl:    furosemide  (LASIX ) 20 MG tablet, Take 1 tablet (20 mg total) by mouth daily as needed., Disp: 30 tablet, Rfl: 5   insulin  glargine, 1 Unit Dial , (TOUJEO ) 300 UNIT/ML Solostar Pen, Inject 10 Units into the skin daily., Disp: 1.5 mL, Rfl: 11   Insulin  Pen Needle (RELION PEN NEEDLES) 31G X 6 MM MISC, USE 1  pen needle with insulin  pen ONCE DAILY for diabetes, Disp: 50 each, Rfl: 11   ipratropium-albuterol  (DUONEB) 0.5-2.5 (3) MG/3ML SOLN, USE 1 VIAL IN NEBULIZER EVERY 6 HOURS AS NEEDED, Disp: 360 mL, Rfl: 0   losartan  (COZAAR ) 50 MG tablet, Take 1 tablet (50 mg total) by mouth daily., Disp: 90 tablet, Rfl: 1   metFORMIN  (GLUCOPHAGE -XR) 500 MG 24 hr tablet, TAKE 2 TABLETS BY MOUTH ONCE DAILY WITH BREAKFAST, Disp: 180 tablet, Rfl: 0   metoprolol  succinate (TOPROL -XL) 50 MG 24 hr tablet, Take 1 tablet (50 mg total) by mouth daily. Take  with or immediately following a meal. May take 1/2 tablet if BP is less than 120/80., Disp: 90 tablet, Rfl: 3   oxyCODONE -acetaminophen  (PERCOCET) 7.5-325 MG tablet, Take 1 tablet by mouth every 6 (six) hours as needed for severe pain (pain score 7-10)., Disp: 30 tablet, Rfl: 0   OXYGEN , Inhale 4 L into the lungs. PT USES [facility] FOR OXYGEN , Disp: , Rfl:    pantoprazole  (PROTONIX ) 40 MG tablet, Take 1 tablet (40 mg total) by mouth daily., Disp: 90 tablet, Rfl: 3   potassium chloride  (KLOR-CON ) 10 MEQ tablet, TAKE 1 TABLET BY MOUTH EVERY OTHER DAY, Disp: 45 tablet, Rfl: 0   pregabalin  (LYRICA ) 75 MG capsule, Take 1 capsule (75 mg total) by  mouth 2 (two) times daily., Disp: 60 capsule, Rfl: 2   vitamin B-12 (CYANOCOBALAMIN ) 100 MCG tablet, Take 100 mcg by mouth daily., Disp: , Rfl:    VITAMIN D , CHOLECALCIFEROL , PO, Take 1 tablet by mouth in the morning., Disp: , Rfl:   Past Medical History: Past Medical History:  Diagnosis Date   Anemia    Arthritis    Asthma    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   Cancer (HCC) 2012   Right Lung CA   COPD (chronic obstructive pulmonary disease) (HCC)    Depression    Diabetes mellitus without complication (HCC)    Patient takes Janumet   Essential hypertension 05/26/2016   Heart failure (HCC) 2022   Hydropneumothorax 05/03/2020   Hypercholesteremia    Oxygen  dependent    2L at nite    PAD (peripheral artery disease) 06/22/2016   Peripheral vascular disease    Personal history of radiation therapy    Shortness of breath dyspnea    with exertion    Sialolithiasis    Sleep apnea    Wears dentures    full upper and lower    Tobacco Use: Social History   Tobacco Use  Smoking Status Former   Current packs/day: 0.00   Average packs/day: 1 pack/day for 37.0 years (37.0 ttl pk-yrs)   Types: Cigarettes   Start date: 02/06/1973   Quit date: 02/06/2010   Years since quitting: 14.3  Smokeless Tobacco Former   Types: Snuff    Labs: Review Flowsheet  More data exists      Latest Ref Rng & Units 01/12/2024 01/27/2024 04/09/2024 04/18/2024 04/21/2024  Labs for ITP Cardiac and Pulmonary Rehab  Hemoglobin A1c 4.8 - 5.6 % 8.0  - - 7.2  -  Bicarbonate 20.0 - 28.0 mmol/L - 40.2  29.9  38.0  37.2  32.7   O2 Saturation % - 41.6  99.1  88.7  59.6     Details       Multiple values from one day are sorted in reverse-chronological order          Pulmonary Assessment Scores:  Pulmonary Assessment Scores     Row Name 04/13/24 1624         ADL UCSD   ADL Phase Entry     SOB Score total 71     Rest 0     Walk 3     Stairs 4      Bath 4     Dress 3     Shop 3       CAT Score   CAT Score 26       mMRC Score   mMRC Score 2        UCSD:  Self-administered rating of dyspnea associated with activities of daily living (ADLs) 6-point scale (0 = not at all to 5 = maximal or unable to do because of breathlessness)  Scoring Scores range from 0 to 120.  Minimally important difference is 5 units  CAT: CAT can identify the health impairment of COPD patients and is better correlated with disease progression.  CAT has a scoring range of zero to 40. The CAT score is classified into four groups of low (less than 10), medium (10 - 20), high (21-30) and very high (31-40) based on the impact level of disease on health status. A CAT score over 10 suggests significant symptoms.  A worsening CAT score could be explained by an exacerbation, poor medication adherence, poor inhaler technique, or progression of COPD or comorbid conditions.  CAT MCID is 2 points  mMRC: mMRC (Modified Medical Research Council) Dyspnea Scale is used to assess the degree of baseline functional disability in patients of respiratory disease due to dyspnea. No minimal important difference is established. A decrease in score of 1 point or greater is considered a positive change.   Pulmonary Function Assessment:   Exercise Target Goals: Exercise Program Goal: Individual exercise prescription set using results from initial 6 min walk test and THRR while considering  patient's activity barriers and safety.   Exercise Prescription Goal: Initial exercise prescription builds to 30-45 minutes a day of aerobic activity, 2-3 days per week.  Home exercise guidelines will be given to patient during program as part of exercise prescription that the participant will acknowledge.  Education: Aerobic Exercise: - Group verbal and visual presentation on the components of exercise prescription. Introduces F.I.T.T principle from ACSM for exercise prescriptions.   Reviews F.I.T.T. principles of aerobic exercise including progression. Written material provided at class time.   Education: Resistance Exercise: - Group verbal and visual presentation on the components of exercise prescription. Introduces F.I.T.T principle from ACSM for exercise prescriptions  Reviews F.I.T.T. principles of resistance exercise including progression. Written material provided at class time.    Education: Exercise & Equipment Safety: - Individual verbal instruction and demonstration of equipment use and safety with use of the equipment. Flowsheet Row Pulmonary Rehab from 04/13/2024 in [facility] Card[facility] Pulmonary Rehab  Date 04/13/24  Educator MB  Instruction Review Code 1- Verbalizes Understanding    Education: Exercise Physiology & General Exercise Guidelines: - Group verbal and written instruction with models to review the exercise physiology of the cardiovascular system and associated critical values. Provides general exercise guidelines with specific guidelines to those with heart or lung disease.    Education: Flexibility, Balance, Mind/Body Relaxation: - Group verbal and visual presentation with interactive activity on the components of exercise prescription. Introduces F.I.T.T principle from ACSM for exercise prescriptions. Reviews F.I.T.T. principles of flexibility and balance exercise training including progression. Also discusses the mind body connection.  Reviews various relaxation techniques to help reduce and manage stress (i.e. Deep breathing, progressive muscle relaxation, and visualization). Balance handout provided to take home. Written material provided at class time.   Activity Barriers & Risk Stratification:  Activity Barriers & Cardiac Risk Stratification - 04/13/24 1620       Activity Barriers & Cardiac Risk Stratification   Activity Barriers Joint Problems;Balance Concerns;Other (comment);Assistive Device    Comments recent stent in L leg causing  numbness          6 Minute Walk:  6 Minute Walk     Row Name 04/13/24 1615  6 Minute Walk   Phase Initial     Distance 620 feet     Walk Time 6 minutes     # of Rest Breaks 0     MPH 1.17     METS 1.97     RPE 11     Perceived Dyspnea  0     VO2 Peak 6.89     Symptoms No     Resting HR 78 bpm     Resting BP 106/60     Resting Oxygen  Saturation  92 %     Exercise Oxygen  Saturation  during 6 min walk 86 %     Max Ex. HR 96 bpm     Max Ex. BP 148/72     2 Minute Post BP 132/72       Interval HR   1 Minute HR 94     2 Minute HR 94     3 Minute HR 94     4 Minute HR 96     5 Minute HR 90     6 Minute HR 95     2 Minute Post HR 76     Interval Heart Rate? Yes       Interval Oxygen    Interval Oxygen ? Yes     Baseline Oxygen  Saturation % 92 %     1 Minute Oxygen  Saturation % 91 %     1 Minute Liters of Oxygen  4 L     2 Minute Oxygen  Saturation % 87 %     2 Minute Liters of Oxygen  4 L     3 Minute Oxygen  Saturation % 86 %     3 Minute Liters of Oxygen  4 L     4 Minute Oxygen  Saturation % 88 %     4 Minute Liters of Oxygen  4 L     5 Minute Oxygen  Saturation % 87 %     5 Minute Liters of Oxygen  4 L     6 Minute Oxygen  Saturation % 87 %     6 Minute Liters of Oxygen  4 L     2 Minute Post Oxygen  Saturation % 95 %     2 Minute Post Liters of Oxygen  4 L       Oxygen  Initial Assessment:  Oxygen  Initial Assessment - 04/07/24 1308       Home Oxygen    Home Oxygen  Device Home Concentrator    Sleep Oxygen  Prescription Continuous    Liters per minute 3.5    Home Exercise Oxygen  Prescription Continuous    Liters per minute 4    Home Resting Oxygen  Prescription Continuous    Liters per minute 4    Compliance with Home Oxygen  Use Yes      Intervention   Short Term Goals To learn and exhibit compliance with exercise, home and travel O2 prescription;To learn and understand importance of monitoring SPO2 with pulse oximeter and demonstrate accurate use of the  pulse oximeter.;To learn and understand importance of maintaining oxygen  saturations>88%;To learn and demonstrate proper pursed lip breathing techniques or other breathing techniques. ;To learn and demonstrate proper use of respiratory medications    Long  Term Goals Verbalizes importance of monitoring SPO2 with pulse oximeter and return demonstration;Exhibits compliance with exercise, home  and travel O2 prescription;Maintenance of O2 saturations>88%;Exhibits proper breathing techniques, such as pursed lip breathing or other method taught during program session;Compliance with respiratory medication;Demonstrates proper use of MDI's  Oxygen  Re-Evaluation:  Oxygen  Re-Evaluation     Row Name 05/01/24 1356             Goals/Expected Outcomes   Comments Reviewed PLB technique with pt.  Talked about how it works and it's importance in maintaining their exercise saturations.       Goals/Expected Outcomes Short: Become more profiecient at using PLB.   Long: Become independent at using PLB.          Oxygen  Discharge (Final Oxygen  Re-Evaluation):  Oxygen  Re-Evaluation - 05/01/24 1356       Goals/Expected Outcomes   Comments Reviewed PLB technique with pt.  Talked about how it works and it's importance in maintaining their exercise saturations.    Goals/Expected Outcomes Short: Become more profiecient at using PLB.   Long: Become independent at using PLB.          Initial Exercise Prescription:  Initial Exercise Prescription - 04/13/24 1600       Date of Initial Exercise RX and Referring Provider   Date 04/13/24    Referring Provider Parris Manna, MD      Oxygen    Oxygen  Continuous    Liters 4    Maintain Oxygen  Saturation 88% or higher      Recumbant Bike   Level 1    RPM 50    Watts 15    Minutes 15    METs 1.97      NuStep   Level 1    SPM 80    Minutes 15    METs 1.97      Track   Laps 17    Minutes 15    METs 1.92      Prescription Details    Frequency (times per week) 2    Duration Progress to 30 minutes of continuous aerobic without signs/symptoms of physical distress      Intensity   THRR 40-80% of Max Heartrate 107-137    Ratings of Perceived Exertion 11-13    Perceived Dyspnea 0-4      Progression   Progression Continue to progress workloads to maintain intensity without signs/symptoms of physical distress.      Resistance Training   Training Prescription Yes    Weight 3 lb    Reps 10-15          Perform Capillary Blood Glucose checks as needed.  Exercise Prescription Changes:   Exercise Prescription Changes     Row Name 04/13/24 1600 05/18/24 1300 05/30/24 1400         Response to Exercise   Blood Pressure (Admit) 106/60 118/66 178/86     Blood Pressure (Exercise) 148/72 150/90 186/78     Blood Pressure (Exit) 132/72 100/50 146/82     Heart Rate (Admit) 78 bpm 102 bpm 93 bpm     Heart Rate (Exercise) 96 bpm 111 bpm 113 bpm     Heart Rate (Exit) 76 bpm 103 bpm 86 bpm     Oxygen  Saturation (Admit) 92 % 87 % 93 %     Oxygen  Saturation (Exercise) 86 % 84 %  came up to 88% with rest and increase to 4 L 84 %  came up to 88% with rest and increase to 4 L     Oxygen  Saturation (Exit) 95 % 87 % 92 %     Rating of Perceived Exertion (Exercise) 11 14 15      Perceived Dyspnea (Exercise) 0 1 0     Symptoms none none none  Comments results 1st week of exercise sessions --     Duration -- Progress to 30 minutes of  aerobic without signs/symptoms of physical distress Continue with 30 min of aerobic exercise without signs/symptoms of physical distress.     Intensity -- THRR unchanged THRR unchanged       Progression   Progression -- Continue to progress workloads to maintain intensity without signs/symptoms of physical distress. Continue to progress workloads to maintain intensity without signs/symptoms of physical distress.     Average METs 1.97 1.58 1.88       Resistance Training   Training Prescription  -- Yes Yes     Weight -- 3 lb 3 lb     Reps -- 10-15 10-15       Interval Training   Interval Training -- No No       Oxygen    Oxygen  -- Continuous Continuous     Liters -- 3-4 3-4       Treadmill   MPH -- -- 0.7     Grade -- -- 0     Minutes -- -- 15     METs -- -- 1.54       Recumbant Bike   Level -- 1 1     Watts -- 15 15     Minutes -- 15 15     METs -- 2.85 2.89       NuStep   Level -- 2 1     Minutes -- 15 15     METs -- 1.5 1.6       Track   Laps -- 10 --     Minutes -- 15 --     METs -- 1.54 --       Oxygen    Maintain Oxygen  Saturation -- 88% or higher 88% or higher        Exercise Comments:   Exercise Comments     Row Name 05/01/24 1352           Exercise Comments First full day of exercise!  Patient was oriented to gym and equipment including functions, settings, policies, and procedures.  Patient's individual exercise prescription and treatment plan were reviewed.  All starting workloads were established based on the results of the 6 minute walk test done at initial orientation visit.  The plan for exercise progression was also introduced and progression will be customized based on patient's performance and goals.          Exercise Goals and Review:   Exercise Goals     Row Name 04/13/24 1623             Exercise Goals   Increase Physical Activity Yes       Intervention Provide advice, education, support and counseling about physical activity/exercise needs.;Develop an individualized exercise prescription for aerobic and resistive training based on initial evaluation findings, risk stratification, comorbidities and participant's personal goals.       Expected Outcomes Short Term: Attend rehab on a regular basis to increase amount of physical activity.;Long Term: Add in home exercise to make exercise part of routine and to increase amount of physical activity.;Long Term: Exercising regularly at least 3-5 days a week.       Increase Strength  and Stamina Yes       Intervention Provide advice, education, support and counseling about physical activity/exercise needs.;Develop an individualized exercise prescription for aerobic and resistive training based on initial evaluation findings, risk stratification, comorbidities and participant's personal goals.  Expected Outcomes Short Term: Increase workloads from initial exercise prescription for resistance, speed, and METs.;Short Term: Perform resistance training exercises routinely during rehab and add in resistance training at home;Long Term: Improve cardiorespiratory fitness, muscular endurance and strength as measured by increased METs and functional capacity ( )       Able to understand and use rate of perceived exertion (RPE) scale Yes       Intervention Provide education and explanation on how to use RPE scale       Expected Outcomes Short Term: Able to use RPE daily in rehab to express subjective intensity level;Long Term:  Able to use RPE to guide intensity level when exercising independently       Able to understand and use Dyspnea scale Yes       Intervention Provide education and explanation on how to use Dyspnea scale       Expected Outcomes Long Term: Able to use Dyspnea scale to guide intensity level when exercising independently;Short Term: Able to use Dyspnea scale daily in rehab to express subjective sense of shortness of breath during exertion       Knowledge and understanding of Target Heart Rate Range (THRR) Yes       Intervention Provide education and explanation of THRR including how the numbers were predicted and where they are located for reference       Expected Outcomes Short Term: Able to state/look up THRR;Short Term: Able to use daily as guideline for intensity in rehab;Long Term: Able to use THRR to govern intensity when exercising independently       Able to check pulse independently Yes       Intervention Provide education and demonstration on how to check  pulse in carotid and radial arteries.;Review the importance of being able to check your own pulse for safety during independent exercise       Expected Outcomes Short Term: Able to explain why pulse checking is important during independent exercise;Long Term: Able to check pulse independently and accurately       Understanding of Exercise Prescription Yes       Intervention Provide education, explanation, and written materials on patient's individual exercise prescription       Expected Outcomes Short Term: Able to explain program exercise prescription;Long Term: Able to explain home exercise prescription to exercise independently          Exercise Goals Re-Evaluation :  Exercise Goals Re-Evaluation     Row Name 05/01/24 1353 05/18/24 1347 05/30/24 1410         Exercise Goal Re-Evaluation   Exercise Goals Review Increase Physical Activity;Able to understand and use rate of perceived exertion (RPE) scale;Knowledge and understanding of Target Heart Rate Range (THRR);Understanding of Exercise Prescription;Increase Strength and Stamina;Able to understand and use Dyspnea scale;Able to check pulse independently Increase Physical Activity;Understanding of Exercise Prescription;Increase Strength and Stamina Increase Physical Activity;Understanding of Exercise Prescription;Increase Strength and Stamina     Comments Reviewed RPE and dyspnea scale, THR and program prescription with pt today.  Pt voiced understanding and was given a copy of goals to take home. Angela Jensen is off to a good start in the program. She completed her first 2 weeks in this review. She was able to work at level 2 on the T4 nustep, level 1 on the recumbent bike, and walk 10 laps on the track. We will continue to monitor her progress in the program. Angela Jensen has only attended two sessions since the last review. She continues to do  well at level 1 on both the recumbent bike and T4 nustep. She also began using the treadmill at a speed of 0.7 mph with  no incline. We will continue to monitor her progress in the program.     Expected Outcomes Short: Use RPE daily to regulate intensity.  Long: Follow program prescription in THR. Short: Continue to follow current exercise prescription. Long: Continue exercise to improve strength and stamina. Short: Attend rehab more consistently. Long: Continue exercise to improve strength and stamina.        Discharge Exercise Prescription (Final Exercise Prescription Changes):  Exercise Prescription Changes - 05/30/24 1400       Response to Exercise   Blood Pressure (Admit) 178/86    Blood Pressure (Exercise) 186/78    Blood Pressure (Exit) 146/82    Heart Rate (Admit) 93 bpm    Heart Rate (Exercise) 113 bpm    Heart Rate (Exit) 86 bpm    Oxygen  Saturation (Admit) 93 %    Oxygen  Saturation (Exercise) 84 %   came up to 88% with rest and increase to 4 L   Oxygen  Saturation (Exit) 92 %    Rating of Perceived Exertion (Exercise) 15    Perceived Dyspnea (Exercise) 0    Symptoms none    Duration Continue with 30 min of aerobic exercise without signs/symptoms of physical distress.    Intensity THRR unchanged      Progression   Progression Continue to progress workloads to maintain intensity without signs/symptoms of physical distress.    Average METs 1.88      Resistance Training   Training Prescription Yes    Weight 3 lb    Reps 10-15      Interval Training   Interval Training No      Oxygen    Oxygen  Continuous    Liters 3-4      Treadmill   MPH 0.7    Grade 0    Minutes 15    METs 1.54      Recumbant Bike   Level 1    Watts 15    Minutes 15    METs 2.89      NuStep   Level 1    Minutes 15    METs 1.6      Oxygen    Maintain Oxygen  Saturation 88% or higher          Nutrition:  Target Goals: Understanding of nutrition guidelines, daily intake of sodium 1500mg , cholesterol 200mg , calories 30% from fat and 7% or less from saturated fats, daily to have 5 or more servings of  fruits and vegetables.  Education: Nutrition 1 -Group instruction provided by verbal, written material, interactive activities, discussions, models, and posters to present general guidelines for heart healthy nutrition including macronutrients, label reading, and promoting whole foods over processed counterparts. Education serves as Pensions consultant of discussion of heart healthy eating for all. Written material provided at class time.     Education: Nutrition 2 -Group instruction provided by verbal, written material, interactive activities, discussions, models, and posters to present general guidelines for heart healthy nutrition including sodium, cholesterol, and saturated fat. Providing guidance of habit forming to improve blood pressure, cholesterol, and body weight. Written material provided at class time.     Biometrics:  Pre Biometrics - 04/13/24 1623       Pre Biometrics   Height 4' 11.72 (1.517 m)    Weight 116 lb 3.2 oz (52.7 kg)    Waist Circumference 35 inches  Hip Circumference 37 inches    Waist to Hip Ratio 0.95 %    BMI (Calculated) 22.9    Single Leg Stand 21 seconds           Nutrition Therapy Plan and Nutrition Goals:  Nutrition Therapy & Goals - 04/13/24 1625       Nutrition Therapy   RD appointment deferred Yes      Personal Nutrition Goals   Nutrition Goal RD appointment deferred at this time      Intervention Plan   Intervention Prescribe, educate and counsel regarding individualized specific dietary modifications aiming towards targeted core components such as weight, hypertension, lipid management, diabetes, heart failure and other comorbidities.    Expected Outcomes Short Term Goal: Understand basic principles of dietary content, such as calories, fat, sodium, cholesterol and nutrients.          Nutrition Assessments:  MEDIFICTS Score Key: >=70 Need to make dietary changes  40-70 Heart Healthy Diet <= 40 Therapeutic Level Cholesterol  Diet  Flowsheet Row Pulmonary Rehab from 04/13/2024 in [facility] Cardiac and Pulmonary Rehab  Picture Your Plate Total Score on Admission 61   Picture Your Plate Scores: <59 Unhealthy dietary pattern with much room for improvement. 41-50 Dietary pattern unlikely to meet recommendations for good health and room for improvement. 51-60 More healthful dietary pattern, with some room for improvement.  >60 Healthy dietary pattern, although there may be some specific behaviors that could be improved.   Nutrition Goals Re-Evaluation:   Nutrition Goals Discharge (Final Nutrition Goals Re-Evaluation):   Psychosocial: Target Goals: Acknowledge presence or absence of significant depression and/or stress, maximize coping skills, provide positive support system. Participant is able to verbalize types and ability to use techniques and skills needed for reducing stress and depression.   Education: Stress, Anxiety, and Depression - Group verbal and visual presentation to define topics covered.  Reviews how body is impacted by stress, anxiety, and depression.  Also discusses healthy ways to reduce stress and to treat/manage anxiety and depression.  Written material provided at class time.   Education: Sleep Hygiene -Provides group verbal and written instruction about how sleep can affect your health.  Define sleep hygiene, discuss sleep cycles and impact of sleep habits. Review good sleep hygiene tips.    Initial Review & Psychosocial Screening:  Initial Psych Review & Screening - 04/07/24 1310       Initial Review   Current issues with Current Stress Concerns;Current Anxiety/Panic    Source of Stress Concerns Chronic Illness      Family Dynamics   Good Support System? Yes      Barriers   Psychosocial barriers to participate in program There are no identifiable barriers or psychosocial needs.      Screening Interventions   Interventions Encouraged to exercise;Provide feedback about the scores to  participant    Expected Outcomes Short Term goal: Utilizing psychosocial counselor, staff and physician to assist with identification of specific Stressors or current issues interfering with healing process. Setting desired goal for each stressor or current issue identified.;Long Term Goal: Stressors or current issues are controlled or eliminated.;Short Term goal: Identification and review with participant of any Quality of Life or Depression concerns found by scoring the questionnaire.;Long Term goal: The participant improves quality of Life and PHQ9 Scores as seen by post scores and/or verbalization of changes          Quality of Life Scores:  Scores of 19 and below usually indicate a poorer quality of  life in these areas.  A difference of  2-3 points is a clinically meaningful difference.  A difference of 2-3 points in the total score of the Quality of Life Index has been associated with significant improvement in overall quality of life, self-image, physical symptoms, and general health in studies assessing change in quality of life.  PHQ-9: Review Flowsheet  More data exists      04/13/2024 03/28/2024 10/13/2023 08/12/2023 10/08/2022  Depression screen PHQ 2/9  Decreased Interest 0 0 0 0 0  Down, Depressed, Hopeless 1 0 0 0 0  PHQ - 2 Score 1 0 0 0 0  Altered sleeping 3 - - - -  Tired, decreased energy 2 - - - -  Change in appetite 3 - - - -  Feeling bad or failure about yourself  0 - - - -  Trouble concentrating 0 - - - -  Moving slowly or fidgety/restless 0 - - - -  Suicidal thoughts 0 - - - -  PHQ-9 Score 9 - - - -  Difficult doing work/chores Very difficult - - - -   Interpretation of Total Score  Total Score Depression Severity:  1-4 = Minimal depression, 5-9 = Mild depression, 10-14 = Moderate depression, 15-19 = Moderately severe depression, 20-27 = Severe depression   Psychosocial Evaluation and Intervention:  Psychosocial Evaluation - 04/07/24 1320       Psychosocial  Evaluation & Interventions   Interventions Encouraged to exercise with the program and follow exercise prescription;Stress management education;Relaxation education    Comments Ms. Neila is coming to pulmonary rehab with COPD. She states her breathing difficulty depends on the day, but she uses her oxygen  consistently. She mentions her anxiety kicks in when she gets real short of breath and xanax  helps with that. Her stress is related to her disease process. She states she has a good support system and is looking forward to the program    Expected Outcomes Short: attend pulmonary rehab for education and exercise Long: develop and maintain positive self care habits    Continue Psychosocial Services  Follow up required by staff          Psychosocial Re-Evaluation:   Psychosocial Discharge (Final Psychosocial Re-Evaluation):   Education: Education Goals: Education classes will be provided on a weekly basis, covering required topics. Participant will state understanding/return demonstration of topics presented.  Learning Barriers/Preferences:  Learning Barriers/Preferences - 04/07/24 1310       Learning Barriers/Preferences   Learning Barriers None    Learning Preferences Individual Instruction          General Pulmonary Education Topics:  Infection Prevention: - Provides verbal and written material to individual with discussion of infection control including proper hand washing and proper equipment cleaning during exercise session. Flowsheet Row Pulmonary Rehab from 04/13/2024 in [facility] Cardiac and Pulmonary Rehab  Date 04/13/24  Educator MB  Instruction Review Code 1- Verbalizes Understanding    Falls Prevention: - Provides verbal and written material to individual with discussion of falls prevention and safety. Flowsheet Row Pulmonary Rehab from 04/13/2024 in [facility] Cardiac and Pulmonary Rehab  Date 04/13/24  Educator MB  Instruction Review Code 1- Verbalizes Understanding     Chronic Lung Disease Review: - Group verbal instruction with posters, models, PowerPoint presentations and videos,  to review new updates, new respiratory medications, new advancements in procedures and treatments. Providing information on websites and 800 numbers for continued self-education. Includes information about supplement oxygen , available portable oxygen  systems, continuous and  intermittent flow rates, oxygen  safety, concentrators, and Medicare reimbursement for oxygen . Explanation of Pulmonary Drugs, including class, frequency, complications, importance of spacers, rinsing mouth after steroid MDI's, and proper cleaning methods for nebulizers. Review of basic lung anatomy and physiology related to function, structure, and complications of lung disease. Review of risk factors. Discussion about methods for diagnosing sleep apnea and types of masks and machines for OSA. Includes a review of the use of types of environmental controls: home humidity, furnaces, filters, dust mite/pet prevention, HEPA vacuums. Discussion about weather changes, air quality and the benefits of nasal washing. Instruction on Warning signs, infection symptoms, calling MD promptly, preventive modes, and value of vaccinations. Review of effective airway clearance, coughing and/or vibration techniques. Emphasizing that all should Create an Action Plan. Written material provided at class time. Flowsheet Row Pulmonary Rehab from 04/13/2024 in [facility] Cardiac and Pulmonary Rehab  Education need identified 04/13/24    AED/CPR: - Group verbal and written instruction with the use of models to demonstrate the basic use of the AED with the basic ABC's of resuscitation.    Tests and Procedures:  - Group verbal and visual presentation and models provide information about basic cardiac anatomy and function. Reviews the testing methods done to diagnose heart disease and the outcomes of the test results. Describes the treatment  choices: Medical Management, Angioplasty, or Coronary Bypass Surgery for treating various heart conditions including Myocardial Infarction, Angina, Valve Disease, and Cardiac Arrhythmias.  Written material provided at class time.   Medication Safety: - Group verbal and visual instruction to review commonly prescribed medications for heart and lung disease. Reviews the medication, class of the drug, and side effects. Includes the steps to properly store meds and maintain the prescription regimen.  Written material given at graduation.   Other: -Provides group and verbal instruction on various topics (see comments)   Knowledge Questionnaire Score:  Knowledge Questionnaire Score - 04/13/24 1626       Knowledge Questionnaire Score   Pre Score 16/18           Core Components/Risk Factors/Patient Goals at Admission:  Personal Goals and Risk Factors at Admission - 04/13/24 1626       Core Components/Risk Factors/Patient Goals on Admission    Weight Management Yes;Weight Maintenance    Intervention Weight Management: Develop a combined nutrition and exercise program designed to reach desired caloric intake, while maintaining appropriate intake of nutrient and fiber, sodium and fats, and appropriate energy expenditure required for the weight goal.;Weight Management: Provide education and appropriate resources to help participant work on and attain dietary goals.;Weight Management/Obesity: Establish reasonable short term and long term weight goals.    Admit Weight 116 lb 3.2 oz (52.7 kg)    Goal Weight: Short Term 11 lb 3.2 oz (5.08 kg)    Goal Weight: Long Term 116 lb 3.2 oz (52.7 kg)    Expected Outcomes Short Term: Continue to assess and modify interventions until short term weight is achieved;Long Term: Adherence to nutrition and physical activity/exercise program aimed toward attainment of established weight goal;Weight Maintenance: Understanding of the daily nutrition guidelines, which  includes 25-35% calories from fat, 7% or less cal from saturated fats, less than 200mg  cholesterol, less than 1.5gm of sodium, & 5 or more servings of fruits and vegetables daily;Understanding recommendations for meals to include 15-35% energy as protein, 25-35% energy from fat, 35-60% energy from carbohydrates, less than 200mg  of dietary cholesterol, 20-35 gm of total fiber daily;Understanding of distribution of calorie intake throughout the  day with the consumption of 4-5 meals/snacks    Diabetes Yes    Intervention Provide education about signs/symptoms and action to take for hypo/hyperglycemia.;Provide education about proper nutrition, including hydration, and aerobic/resistive exercise prescription along with prescribed medications to achieve blood glucose in normal ranges: Fasting glucose 65-99 mg/dL    Expected Outcomes Long Term: Attainment of HbA1C < 7%.;Short Term: Participant verbalizes understanding of the signs/symptoms and immediate care of hyper/hypoglycemia, proper foot care and importance of medication, aerobic/resistive exercise and nutrition plan for blood glucose control.    Hypertension Yes    Intervention Provide education on lifestyle modifcations including regular physical activity/exercise, weight management, moderate sodium restriction and increased consumption of fresh fruit, vegetables, and low fat dairy, alcohol moderation, and smoking cessation.;Monitor prescription use compliance.    Expected Outcomes Short Term: Continued assessment and intervention until BP is < 140/91mm HG in hypertensive participants. < 130/14mm HG in hypertensive participants with diabetes, heart failure or chronic kidney disease.;Long Term: Maintenance of blood pressure at goal levels.          Education:Diabetes - Individual verbal and written instruction to review signs/symptoms of diabetes, desired ranges of glucose level fasting, after meals and with exercise. Acknowledge that pre and post  exercise glucose checks will be done for 3 sessions at entry of program. Flowsheet Row Pulmonary Rehab from 04/13/2024 in [facility] Cardiac and Pulmonary Rehab  Date 04/13/24  Educator MB  Instruction Review Code 1- Verbalizes Understanding    Know Your Numbers and Heart Failure: - Group verbal and visual instruction to discuss disease risk factors for cardiac and pulmonary disease and treatment options.  Reviews associated critical values for Overweight/Obesity, Hypertension, Cholesterol, and Diabetes.  Discusses basics of heart failure: signs/symptoms and treatments.  Introduces Heart Failure Zone chart for action plan for heart failure. Written material provided at class time.   Core Components/Risk Factors/Patient Goals Review:    Core Components/Risk Factors/Patient Goals at Discharge (Final Review):    ITP Comments:  ITP Comments     Row Name 04/07/24 1318 04/13/24 1615 05/01/24 1352 05/10/24 1407 06/07/24 1030   ITP Comments Initial phone call completed. Diagnosis can be found in [facility] 7/23. EP Orientation scheduled for Thursday 8/28 at 2pm. Completed and gym orientation for pulmonary rehab. Initial ITP created and sent for review to Dr. Faud Aleskerov, Medical Director. First full day of exercise!  Patient was oriented to gym and equipment including functions, settings, policies, and procedures.  Patient's individual exercise prescription and treatment plan were reviewed.  All starting workloads were established based on the results of the 6 minute walk test done at initial orientation visit.  The plan for exercise progression was also introduced and progression will be customized based on patient's performance and goals. 30 Day review completed. Medical Director ITP review done; changes made as directed and signed approval by Medical Director. New to program. 30 Day review completed. Medical Director ITP review done, changes made as directed, and signed approval by Medical Director.       Comments: 30 day review

## 2024-06-08 ENCOUNTER — Telehealth (INDEPENDENT_AMBULATORY_CARE_PROVIDER_SITE_OTHER): Payer: Self-pay

## 2024-06-08 NOTE — Telephone Encounter (Signed)
 Let's get her in soon with ABIs

## 2024-06-08 NOTE — Telephone Encounter (Signed)
 Patient called the nurse line concerned about her left leg and foot. Patient stated it is cold, swollen and painful at this time. She stated she had an appointment 06/06/24 and was unable to make the appointment due to being hospitalized. It was noted during the time patient was hospitalized procedure for LLE angio with Dr. Marea could not be scheduled. Please advise.

## 2024-06-11 ENCOUNTER — Other Ambulatory Visit: Payer: Self-pay | Admitting: Nurse Practitioner

## 2024-06-11 DIAGNOSIS — F419 Anxiety disorder, unspecified: Secondary | ICD-10-CM

## 2024-06-11 DIAGNOSIS — E1165 Type 2 diabetes mellitus with hyperglycemia: Secondary | ICD-10-CM

## 2024-06-12 ENCOUNTER — Encounter

## 2024-06-12 NOTE — Telephone Encounter (Signed)
 Patient is scheduled for appointment to see provider 11/4 @ 215P with ABI's.

## 2024-06-14 ENCOUNTER — Encounter

## 2024-06-14 DIAGNOSIS — J449 Chronic obstructive pulmonary disease, unspecified: Secondary | ICD-10-CM | POA: Diagnosis not present

## 2024-06-14 NOTE — Progress Notes (Signed)
 Daily Session Note  Patient Details  Name: Angela Jensen MRN: [id number] Date of Birth: [birth year] Referring Provider:   Conrad Jensen Pulmonary Rehab from 04/13/2024 in [facility] Cardiac and Pulmonary Rehab  Referring Provider Angela Manna, MD    Encounter Date: 06/14/2024  Check In:  Session Check In - 06/14/24 1415       Check-In   Supervising physician immediately available to respond to emergencies See telemetry face sheet for immediately available ER MD    Location [facility]-Cardiac & Pulmonary Rehab    Staff Present Burnard Davenport RN,BSN,MPA;Laura Cates RN,BSN;Noah Laird HECKLE, Exercise Physiologist;Madoc Holquin Dyane HECKLE, ACSM CEP, Exercise Physiologist    Virtual Visit No    Medication changes reported     No    Fall or balance concerns reported    No    Warm-up and Cool-down Performed on first and last piece of equipment    Resistance Training Performed Yes    VAD Patient? No    PAD/SET Patient? No      Pain Assessment   Currently in Pain? No/denies             Social History   Tobacco Use  Smoking Status Former   Current packs/day: 0.00   Average packs/day: 1 pack/day for 37.0 years (37.0 ttl pk-yrs)   Types: Cigarettes   Start date: 02/06/1973   Quit date: 02/06/2010   Years since quitting: 14.3  Smokeless Tobacco Former   Types: Snuff    Goals Met:  Proper associated with RPD/PD & O2 Sat Independence with exercise equipment Using PLB without cueing & demonstrates good technique Exercise tolerated well No report of concerns or symptoms today Strength training completed today  Goals Unmet:  Not Applicable  Comments: Pt able to follow exercise prescription today without complaint.  Will continue to monitor for progression.    Dr. Oneil Jensen is Medical Director for [facility] Cardiac Rehabilitation.  Dr. Fuad Jensen is Medical Director for [facility] Pulmonary Rehabilitation.

## 2024-06-15 ENCOUNTER — Ambulatory Visit (INDEPENDENT_AMBULATORY_CARE_PROVIDER_SITE_OTHER): Admitting: Nurse Practitioner

## 2024-06-15 ENCOUNTER — Encounter: Payer: Self-pay | Admitting: Nurse Practitioner

## 2024-06-15 VITALS — BP 162/82 | HR 89 | Temp 97.5°F | Resp 16 | Ht 59.0 in | Wt 119.0 lb

## 2024-06-15 DIAGNOSIS — Z09 Encounter for follow-up examination after completed treatment for conditions other than malignant neoplasm: Secondary | ICD-10-CM | POA: Diagnosis not present

## 2024-06-15 DIAGNOSIS — J9611 Chronic respiratory failure with hypoxia: Secondary | ICD-10-CM

## 2024-06-15 DIAGNOSIS — J441 Chronic obstructive pulmonary disease with (acute) exacerbation: Secondary | ICD-10-CM

## 2024-06-15 DIAGNOSIS — M79605 Pain in left leg: Secondary | ICD-10-CM

## 2024-06-15 DIAGNOSIS — F419 Anxiety disorder, unspecified: Secondary | ICD-10-CM

## 2024-06-15 DIAGNOSIS — I739 Peripheral vascular disease, unspecified: Secondary | ICD-10-CM | POA: Diagnosis not present

## 2024-06-15 DIAGNOSIS — Z95811 Presence of heart assist device: Secondary | ICD-10-CM

## 2024-06-15 MED ORDER — ALPRAZOLAM 1 MG PO TABS: 1.0000 mg | ORAL_TABLET | Freq: Two times a day (BID) | ORAL | 1 refills | Status: DC | PRN

## 2024-06-15 MED ORDER — OXYCODONE-ACETAMINOPHEN 7.5-325 MG PO TABS: 1.0000 | ORAL_TABLET | Freq: Two times a day (BID) | ORAL | 0 refills | Status: DC | PRN

## 2024-06-15 NOTE — Progress Notes (Unsigned)
 [facility] [place], [place] [address] [place] [address] [phone]                                   Transitional Care Clinic   [facility] Discharge Acute Issues Care Follow Up                                                                        Patient Demographics  Angela Jensen, is a 68 y.o. female  DOB [birth year]  MRN [id number].  Primary MD  Liana Fish, NP  Admit date: 06/03/24 Discharge date: 06/07/24  Reason for TCC follow Up - COPD exacerbation   Past Medical History:  Diagnosis Date   Anemia    Arthritis    Asthma    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   Cancer [facility]) 2012   Right Lung CA   COPD (chronic obstructive pulmonary disease) (HCC)    Depression    Diabetes mellitus without complication (HCC)    Patient takes Janumet   Essential hypertension 05/26/2016   Heart failure (HCC) 2022   Hydropneumothorax 05/03/2020   Hypercholesteremia    Oxygen  dependent    2L at nite    PAD (peripheral artery disease) 06/22/2016   Peripheral vascular disease    Personal history of radiation therapy    Shortness of breath dyspnea    with exertion    Sialolithiasis    Sleep apnea    Wears dentures    full upper and lower    Past Surgical History:  Procedure Laterality Date   APPLICATION OF CELL SAVER Left 01/21/2024   Procedure: APPLICATION OF CELL SAVER;  Surgeon: Marea Selinda RAMAN, MD;  Location: [facility] ORS;  Service: Vascular;  Laterality: Left;   CESAREAN SECTION     x3   COLONOSCOPY N/A 04/03/2024   Procedure: COLONOSCOPY;  Surgeon: Jinny Carmine, MD;  Location: [facility] ENDOSCOPY;  Service: Endoscopy;  Laterality: N/A;   COLONOSCOPY WITH PROPOFOL  N/A 06/25/2015   Procedure: COLONOSCOPY WITH PROPOFOL ;  Surgeon: Carmine Jinny, MD;  Location: [facility] ENDOSCOPY;  Service: Endoscopy;  Laterality: N/A;   COLONOSCOPY WITH PROPOFOL  N/A 07/26/2020   Procedure: COLONOSCOPY WITH PROPOFOL ;   Surgeon: Jinny Carmine, MD;  Location: [facility];  Service: Endoscopy;  Laterality: N/A;   CYST REMOVAL LEG     and on shoulder    ENDARTERECTOMY FEMORAL Left 01/21/2024   Procedure: LEFT COMMON, SUPERFICIAL FEMORAL AND PROFUNDA FEMORIS ENDARTERECTOMY WITH BOVINE PERICARDIAL PATCH ANGIOPLASTY;  Surgeon: Marea Selinda RAMAN, MD;  Location: [facility] ORS;  Service: Vascular;  Laterality: Left;   ESOPHAGOGASTRODUODENOSCOPY (EGD) WITH PROPOFOL  N/A 07/26/2020   Procedure: ESOPHAGOGASTRODUODENOSCOPY (EGD) WITH PROPOFOL ;  Surgeon: Jinny Carmine, MD;  Location: [facility];  Service: Endoscopy;  Laterality: N/A;  Diabetic - oral meds   INSERTION OF ILIAC STENT Left 01/21/2024   Procedure: INSERTION, STENT, ARTERY, ILIAC;  Surgeon: Marea Selinda RAMAN, MD;  Location: [facility] ORS;  Service: Vascular;  Laterality: Left;  AND SFA STENTS   LOWER EXTREMITY ANGIOGRAPHY Left 09/29/2018   Procedure: LOWER EXTREMITY ANGIOGRAPHY;  Surgeon: Marea Selinda RAMAN, MD;  Location: [facility];  Service: Cardiovascular;  Laterality: Left;   LOWER EXTREMITY ANGIOGRAPHY Left 07/29/2023   Procedure: Lower Extremity Angiography;  Surgeon: Marea Selinda RAMAN, MD;  Location: [facility];  Service: Cardiovascular;  Laterality: Left;   LOWER EXTREMITY ANGIOGRAPHY Right 12/20/2023   Procedure: Lower Extremity Angiography;  Surgeon: Marea Selinda RAMAN, MD;  Location: [facility];  Service: Cardiovascular;  Laterality: Right;   LOWER EXTREMITY ANGIOGRAPHY Left 01/17/2024   Procedure: Lower Extremity Angiography;  Surgeon: Marea Selinda RAMAN, MD;  Location: [facility];  Service: Cardiovascular;  Laterality: Left;   LUNG BIOPSY  12/30/2011   has lung spots   PACEMAKER IMPLANT  07/14/2021   PACEMAKER LEADLESS INSERTION N/A 07/14/2021   Procedure: PACEMAKER LEADLESS INSERTION;  Surgeon: Ammon Blunt, MD;  Location: [facility];  Service: Cardiovascular;  Laterality: N/A;   PERIPHERAL VASCULAR CATHETERIZATION Left  06/01/2016   Procedure: Lower Extremity Angiography;  Surgeon: Selinda RAMAN Marea, MD;  Location: [facility];  Service: Cardiovascular;  Laterality: Left;   PERIPHERAL VASCULAR CATHETERIZATION N/A 06/01/2016   Procedure: Abdominal Aortogram w/Lower Extremity;  Surgeon: Selinda RAMAN Marea, MD;  Location: [facility];  Service: Cardiovascular;  Laterality: N/A;   PERIPHERAL VASCULAR CATHETERIZATION  06/01/2016   Procedure: Lower Extremity Intervention;  Surgeon: Selinda RAMAN Marea, MD;  Location: [facility];  Service: Cardiovascular;;   PERIPHERAL VASCULAR CATHETERIZATION Right 06/08/2016   Procedure: Lower Extremity Angiography;  Surgeon: Selinda RAMAN Marea, MD;  Location: [facility];  Service: Cardiovascular;  Laterality: Right;   PERIPHERAL VASCULAR CATHETERIZATION  06/08/2016   Procedure: Lower Extremity Intervention;  Surgeon: Selinda RAMAN Marea, MD;  Location: [facility];  Service: Cardiovascular;;   POLYPECTOMY  04/03/2024   Procedure: POLYPECTOMY, INTESTINE;  Surgeon: Jinny Carmine, MD;  Location: [facility] ENDOS[facility];  Service: Endoscopy;;   SUBMANDIBULAR GLAND EXCISION Left 12/06/2020   Procedure: EXCISION SUBMANDIBULAR GLAND;  Surgeon: Herminio Miu, MD;  Location: [facility];  Service: ENT;  Laterality: Left;  needs to be first case Diabetic - diet controlled   TEMPORARY PACEMAKER N/A 07/11/2021   Procedure: TEMPORARY PACEMAKER;  Surgeon: Ammon Blunt, MD;  Location: [facility];  Service: Cardiovascular;  Laterality: N/A;       Recent HPI and Hospital Course  HPI: Patient reports almost 3 weeks of worsening dyspnea and pinkish sputum. Her partner of 47 years is at the bedside and supplies some history as well. Patient is very clear that she was not coughing up gross blood, but pinkish sputum. Both she and her partner Angela Jensen are actually quite vivid historians, unsure why it was said previously that they are poor historians. She reports compliance  with her medications and has a list of her medications in hand. She denies fevers/chills, chest pain, orthopnea, PND, or peripheral edema. Of note, patient was recently hospitalized 9/2-9/5 for COPD exacerbation, which improved with steroids and antibiotics   Acute on chronic dyspnea  COPD (3-4L [place] at baseline)  Patient feels improved after initiation of steroids and antibiotics. Oxygen  requirements have been at baseline throughout admission. For context, patient was recently hospitalized for a COPD exacerbation and was undergoing pulmonary rehabilitation. Given the level of complexity of this case and presence of hemoptysis, pulmonology was consulted to help guide further interventions. Now treating as COPD exacerbation and pneumonia. Also important to note, since patient has had prior exacerbations as well as recent hospitalization, broadened to cefepime  to cover for Pseudomonas as  she is at risk for the above reasons. On 10/21 she noted concern regarding ongoing desaturations whenever she gets up to ambulate despite use of home 3-4 L of O2. Will plan for formal 6 MWT today to further assess if she is in need of additional O2 support as her home O2 machine can only provide a maximum of 5 L of O2.  - Pulm consult, signed off - LRCx - s/p CTX 10/19 - Cefepime  (10/19 - ) -Transition to levaquin  on DC if no culture data to indicate otherwise, plan for total 7d treatment  - Continue azithromycin , will plan to continue indefinitely given several recent COPD exacerbations  -check EKG prior to discharge for Qtc  -prednisone  40 mg daily x5 days, then taper by 10mg  q3 days with plans to remain on 10mg  daily long term  - Scheduled Duonebs  Hemoptysis (resolved) Has been ongoing for 3 weeks. Has a history of lung scarring from radiation as well as being on DAPT for PAD revascularization in 01/2024. CTA chest showed hypertrophied right bronchial artery.  - Continue DAPT - Pulm consult as above - Consider TXA  if worsening  Chronic Problems  HFpEF  HTN  - Continue home metoprolol  succinate 25 mg daily  - INCREASE home losartan  to 50mg  daily   PAD  HLD  Recently had left common femoral endarterectomy with stent placement to left external iliac and left SFA in June 2025. She is following with vascular surgery and on DAPT.  - Continue ASA 81 mg daily  - Continue Plavix  75 mg daily  - Continue Lipitor 10 mg daily  - Home Percocet TID PRN   SSS s/p pacemaker  - Continue home metoprolol  succinate 25 mg daily   T2DM - Continue home lantus  10 units nightly - Hold home metformin  while inpatient  - SSI   Anxiety - Continue home Lexapro  10 mg daily  - Restart home xanax  1 mg BID PRN  GERD - Protonix  40 mg daily   Daily Checklist: Diet: Regular Diet DVT PPx: Lovenox  40mg  q24h Code Status: Full Code Dispo: Continue current level of care, tentatively home on 10/21  Team Contact Information:  Primary Team: Internal Medicine (MEDM) Primary Resident: Redell GORMAN Devonshire, MD Resident's Pager: [phone] [facility] Senior Resident)  Interval History:  No acute events overnight. Patient notes some improvement but expressed ongoing concerns about desaturations while ambulating.    Post Hospital Acute Care Issue to be followed in the Clinic   Dyspnea Active Problems: Chronic obstructive pulmonary disease (CMS-HCC) Diabetes (CMS-HCC) Essential hypertension PAD (peripheral artery disease)    Subjective:   Ronal Brain today has, No headache, No chest pain, No abdominal pain - No Nausea, No new weakness tingling or numbness, No Cough - SOB.   Assessment & Plan   1. Chronic obstructive pulmonary disease with acute exacerbation (HCC) (Primary) Continue breztri  inhaler and continuous oxygen  and continue to follow up with Dr. Aleskerov.   2. Chronic respiratory failure with hypoxia (HCC) On continuous oxygen  at 5LPM. This was communicated to Dr. Aleskerov so that her order with [facility] can  be updated.   3. PVD (peripheral vascular disease) Continue prn oxycodone  as prescribed. Follow up in December for refills - oxyCODONE -acetaminophen  (PERCOCET) 7.5-325 MG tablet; Take 1 tablet by mouth 2 (two) times daily as needed for severe pain (pain score 7-10).  Dispense: 60 tablet; Refill: 0 - oxyCODONE -acetaminophen  (PERCOCET) 7.5-325 MG tablet; Take 1 tablet by mouth 2 (two) times daily as needed for severe pain (  pain score 7-10).  Dispense: 60 tablet; Refill: 0  4. Left leg pain Continue prn oxycodone  as prescribed. Follow up in December for refills - oxyCODONE -acetaminophen  (PERCOCET) 7.5-325 MG tablet; Take 1 tablet by mouth 2 (two) times daily as needed for severe pain (pain score 7-10).  Dispense: 60 tablet; Refill: 0 - oxyCODONE -acetaminophen  (PERCOCET) 7.5-325 MG tablet; Take 1 tablet by mouth 2 (two) times daily as needed for severe pain (pain score 7-10).  Dispense: 60 tablet; Refill: 0  5. Hospital discharge follow-up Treated for COPD exacerbation   6. Presence of heart assist device [facility]) Has a pacemaker, monitored by cardiology   7. Anxiety Continue prn alprazolam  as prescribed, follow up in late December for refills.  - ALPRAZolam  (XANAX ) 1 MG tablet; Take 1 tablet (1 mg total) by mouth 2 (two) times daily as needed for anxiety or sleep.  Dispense: 60 tablet; Refill: 1   Reason for frequent admissions/ER visits       Objective:   Vitals:   06/15/24 1356  BP: (!) 162/82  Pulse: 89  Resp: 16  Temp: (!) 97.5 F (36.4 C)  SpO2: 93%  Weight: 119 lb (54 kg)  Height: 4' 11 (1.499 m)    Wt Readings from Last 3 Encounters:  06/15/24 119 lb (54 kg)  06/01/24 118 lb 6.4 oz (53.7 kg)  05/23/24 116 lb 12.8 oz (53 kg)    Allergies as of 06/15/2024       Reactions   Iodinated Contrast Media Itching   Trelegy Ellipta  [fluticasone -umeclidin-vilant]    Had breathing issues   Bactoshield Chg [chlorhexidine  Gluconate] Itching, Other (See Comments)   Dry.  Flaking, peeling skin        Medication List        Accurate as of June 15, 2024  2:33 PM. If you have any questions, ask your nurse or doctor.          Accu-Chek Guide Test test strip Generic drug: glucose blood USE TO CHECK BLOOD SUGAR 5 TIMES DAILY   Accu-Chek Guide w/Device Kit Use as directed Dx e11.65   Accu-Chek Softclix Lancets lancets Use 1 lancet 3 times daily to check glucose for diabetes   albuterol  (5 MG/ML) 0.5% nebulizer solution Commonly known as: PROVENTIL  Take 2.5 mg by nebulization every 6 (six) hours as needed for wheezing or shortness of breath.   albuterol  108 (90 Base) MCG/ACT inhaler Commonly known as: VENTOLIN  HFA INHALE 2 PUFFS BY MOUTH EVERY 6 HOURS AS NEEDED FOR WHEEZING OR SHORTNESS OF BREATH   ALPRAZolam  1 MG tablet Commonly known as: XANAX  Take 1 tablet (1 mg total) by mouth 2 (two) times daily as needed for anxiety or sleep.   aspirin  EC 81 MG tablet Take 1 tablet (81 mg total) by mouth daily. Swallow whole.   atorvastatin  10 MG tablet Commonly known as: Lipitor Take 1 tablet (10 mg total) by mouth daily.   budesonide -glycopyrrolate -formoterol  160-9-4.8 MCG/ACT Aero inhaler Commonly known as: BREZTRI  Inhale 2 puffs into the lungs 2 (two) times daily.   calcium  carbonate 1500 (600 Ca) MG Tabs tablet Commonly known as: OSCAL Take 600 mg of elemental calcium  by mouth 2 (two) times daily with a meal.   clopidogrel  75 MG tablet Commonly known as: PLAVIX  Take 1 tablet (75 mg total) by mouth daily.   cyclobenzaprine  10 MG tablet Commonly known as: FLEXERIL  Take 1 tablet (10 mg total) by mouth at bedtime. Take one tab po qhs for back spasm prn only   escitalopram   10 MG tablet Commonly known as: LEXAPRO  Take 1 tablet by mouth once daily   ferrous sulfate  325 (65 FE) MG tablet Take 1 tablet (325 mg total) by mouth daily with breakfast.   furosemide  20 MG tablet Commonly known as: LASIX  Take 1 tablet (20 mg total) by mouth  daily as needed.   insulin  glargine (1 Unit Dial ) 300 UNIT/ML Solostar Pen Commonly known as: TOUJEO  Inject 10 Units into the skin daily.   ipratropium-albuterol  0.5-2.5 (3) MG/3ML Soln Commonly known as: DUONEB USE 1 VIAL IN NEBULIZER EVERY 6 HOURS AS NEEDED   losartan  50 MG tablet Commonly known as: COZAAR  Take 1 tablet (50 mg total) by mouth daily.   metFORMIN  500 MG 24 hr tablet Commonly known as: GLUCOPHAGE -XR TAKE 2 TABLETS BY MOUTH ONCE DAILY WITH BREAKFAST   metoprolol  succinate 50 MG 24 hr tablet Commonly known as: TOPROL -XL Take 1 tablet (50 mg total) by mouth daily. Take with or immediately following a meal. May take 1/2 tablet if BP is less than 120/80.   oxyCODONE -acetaminophen  7.5-325 MG tablet Commonly known as: PERCOCET Take 1 tablet by mouth 2 (two) times daily as needed for severe pain (pain score 7-10). What changed: when to take this Changed by: Sriya Kroeze   oxyCODONE -acetaminophen  7.5-325 MG tablet Commonly known as: PERCOCET Take 1 tablet by mouth 2 (two) times daily as needed for severe pain (pain score 7-10). Start taking on: July 13, 2024 What changed: You were already taking a medication with the same name, and this prescription was added. Make sure you understand how and when to take each. Changed by: Mardy Maxin   OXYGEN  Inhale 4 L into the lungs. PT USES [facility] FOR OXYGEN    pantoprazole  40 MG tablet Commonly known as: Protonix  Take 1 tablet (40 mg total) by mouth daily.   potassium chloride  10 MEQ tablet Commonly known as: KLOR-CON  TAKE 1 TABLET BY MOUTH EVERY OTHER DAY   pregabalin  75 MG capsule Commonly known as: Lyrica  Take 1 capsule (75 mg total) by mouth 2 (two) times daily.   ReliOn Pen Needles 31G X 6 MM Misc Generic drug: Insulin  Pen Needle USE 1  pen needle with insulin  pen ONCE DAILY for diabetes   vitamin B-12 100 MCG tablet Commonly known as: CYANOCOBALAMIN  Take 100 mcg by mouth daily.   VITAMIN D   (CHOLECALCIFEROL ) PO Take 1 tablet by mouth in the morning.         Physical Exam: Constitutional: Patient appears well-developed and well-nourished. Not in obvious distress. HENT: Normocephalic, atraumatic Eyes: Conjunctivae and EOM are normal. PERRLA, no scleral icterus. Neck: Normal ROM. Neck supple.  CVS: RRR, S1/S2 +, no murmurs, no gallops, no carotid bruit.  Pulmonary: Effort and breath sounds normal, no stridor, rhonchi, wheezes, rales. On continuous oxygen  at 5 LPM Abdominal: Soft. BS +, no distension, tenderness, rebound or guarding.  Lymphadenopathy: No lymphadenopathy noted Neuro: Alert and oriented  Skin: Skin is warm and dry. No rash noted.  Psychiatric: Normal mood and affect.   Data Review   Micro Results No results found for this or any previous visit (from the past 240 hours).   CBC No results for input(s): WBC, HGB, HCT, PLT, MCV, MCH, MCHC, RDW, LYMPHSABS, MONOABS, EOSABS, BASOSABS, BANDABS in the last 168 hours.  Invalid input(s): NEUTRABS, BANDSABD  Chemistries  No results for input(s): NA, K, CL, CO2, GLUCOSE, BUN, CREATININE, CALCIUM , MG, AST, ALT, ALKPHOS, BILITOT in the last 168 hours.  Invalid input(s): GFRCGP ------------------------------------------------------------------------------------------------------------------ CrCl cannot  be calculated (Patient's most recent lab result is older than the maximum 21 days allowed.). ------------------------------------------------------------------------------------------------------------------ No results for input(s): HGBA1C in the last 72 hours. ------------------------------------------------------------------------------------------------------------------ No results for input(s): CHOL, HDL, LDLCALC, TRIG, CHOLHDL, LDLDIRECT in the last 72  hours. ------------------------------------------------------------------------------------------------------------------ No results for input(s): TSH, T4TOTAL, T3FREE, THYROIDAB in the last 72 hours.  Invalid input(s): FREET3 ------------------------------------------------------------------------------------------------------------------ No results for input(s): VITAMINB12, FOLATE, FERRITIN, TIBC, IRON , RETICCTPCT in the last 72 hours.  Coagulation profile No results for input(s): INR, PROTIME in the last 168 hours.  No results for input(s): DDIMER in the last 72 hours.  Cardiac Enzymes No results for input(s): CKMB, TROPONINI, MYOGLOBIN in the last 168 hours.  Invalid input(s): CK ------------------------------------------------------------------------------------------------------------------ Invalid input(s): POCBNP  Return for previously scheduled, AWV, Treysen Sudbeck PCP in late december.   Time Spent in minutes  45 Time spent with patient included reviewing progress notes, labs, imaging studies, and discussing plan for follow up.    This patient was seen by Mardy Maxin, FNP-C in collaboration with Dr. Sigrid Bathe as a part of collaborative care agreement.   Mardy Maxin MSN, FNP-C on 06/15/2024 at 2:12 PM   **Disclaimer: This note may have been dictated with voice recognition software. Similar sounding words can inadvertently be transcribed and this note may contain transcription errors which may not have been corrected upon publication of note.**

## 2024-06-16 ENCOUNTER — Encounter: Payer: Self-pay | Admitting: Nurse Practitioner

## 2024-06-19 ENCOUNTER — Encounter: Attending: Pulmonary Disease | Admitting: Emergency Medicine

## 2024-06-19 DIAGNOSIS — J449 Chronic obstructive pulmonary disease, unspecified: Secondary | ICD-10-CM | POA: Diagnosis present

## 2024-06-19 NOTE — Progress Notes (Signed)
 Daily Session Note  Patient Details  Name: Angela Jensen MRN: [id number] Date of Birth: 05-29-1956 Referring Provider:   [facility] from 04/13/2024 in [facility] Cardiac [facility] Pulmonary Re[facility]  Referring Provider Parris Manna, MD    Encounter Date: 06/19/2024  Check In:  Session Check In - 06/19/24 1357       Check-In   Supervising physician immediately available to respond to emergencies See telemetry face sheet for immediately available ER MD    Location [facility]    Staff Present Leita Franks RN,BSN;Maxon Conetta BS, Exercise Physiologist;Margaret Best, MS, Exercise Physiologist;Noah Tickle, BS, Exercise Physiologist    Virtual Visit No    Medication changes reported     No    Fall or balance concerns reported    No    Warm-up and Cool-down Performed on first and last piece of equipment    Resistance Training Performed Yes    VAD Patient? No    PAD/SET Patient? No      Pain Assessment   Currently in Pain? No/denies             Social History   Tobacco Use  Smoking Status Former   Current packs/day: 0.00   Average packs/day: 1 pack/day for 37.0 years (37.0 ttl pk-yrs)   Types: Cigarettes   Start date: 02/06/1973   Quit date: 02/06/2010   Years since quitting: 14.3  Smokeless Tobacco Former   Types: Snuff    Goals Met:  Proper associated with RPD/PD & O2 Sat Independence with exercise equipment Using PLB without cueing & demonstrates good technique Exercise tolerated well No report of concerns or symptoms today Strength training completed today  Goals Unmet:  Not Applicable  Comments: Pt able to follow exercise prescription today without complaint.  Will continue to monitor for progression.    Dr. Oneil Pinal is Medical Director for [facility] Cardiac Rehabilitation.  Dr. Fuad Aleskerov is Medical Director for [facility] Pulmonary Rehabilitation.

## 2024-06-20 ENCOUNTER — Encounter (INDEPENDENT_AMBULATORY_CARE_PROVIDER_SITE_OTHER): Payer: Self-pay | Admitting: Nurse Practitioner

## 2024-06-20 ENCOUNTER — Ambulatory Visit (INDEPENDENT_AMBULATORY_CARE_PROVIDER_SITE_OTHER): Admitting: Nurse Practitioner

## 2024-06-20 VITALS — BP 130/78 | HR 90 | Resp 16 | Ht 59.0 in | Wt 116.4 lb

## 2024-06-20 DIAGNOSIS — I739 Peripheral vascular disease, unspecified: Secondary | ICD-10-CM | POA: Diagnosis not present

## 2024-06-20 DIAGNOSIS — I10 Essential (primary) hypertension: Secondary | ICD-10-CM

## 2024-06-20 DIAGNOSIS — M79605 Pain in left leg: Secondary | ICD-10-CM | POA: Diagnosis not present

## 2024-06-20 DIAGNOSIS — Z9889 Other specified postprocedural states: Secondary | ICD-10-CM

## 2024-06-20 MED ORDER — PREGABALIN 75 MG PO CAPS: 75.0000 mg | ORAL_CAPSULE | Freq: Three times a day (TID) | ORAL | 0 refills | Status: DC

## 2024-06-21 ENCOUNTER — Encounter: Admitting: Emergency Medicine

## 2024-06-21 DIAGNOSIS — J449 Chronic obstructive pulmonary disease, unspecified: Secondary | ICD-10-CM | POA: Diagnosis not present

## 2024-06-21 NOTE — Progress Notes (Signed)
 Daily Session Note  Patient Details  Name: YELENA METZER MRN: [id number] Date of Birth: 06/04/1956 Referring Provider:   [facility] from 04/13/2024 in [facility] Pul[facility] Re[facility]  Referring Provider Parris Manna, MD    Encounter Date: 06/21/2024  Check In:  Session Check In - 06/21/24 1356       Check-In   Supervising physician immediately available to respond to emergencies See telemetry face sheet for immediately available ER MD    Location [facility]    Staff Present Leita Franks RN,BSN;Noah Tickle, BS, Exercise Physiologist;Kelly Dyane HECKLE, ACSM CEP, Exercise Physiologist;Jason Elnor RDN,LDN    Virtual Visit No    Medication changes reported     No    Fall or balance concerns reported    No    Warm-up and Cool-down Performed on first and last piece of equipment    Resistance Training Performed Yes    VAD Patient? No    PAD/SET Patient? No      Pain Assessment   Currently in Pain? No/denies             Social History   Tobacco Use  Smoking Status Former   Current packs/day: 0.00   Average packs/day: 1 pack/day for 37.0 years (37.0 ttl pk-yrs)   Types: Cigarettes   Start date: 02/06/1973   Quit date: 02/06/2010   Years since quitting: 14.3  Smokeless Tobacco Former   Types: Snuff    Goals Met:  Proper associated with RPD/PD & O2 Sat Independence with exercise equipment Using PLB without cueing & demonstrates good technique Exercise tolerated well No report of concerns or symptoms today Strength training completed today  Goals Unmet:  Not Applicable  Comments: Pt able to follow exercise prescription today without complaint.  Will continue to monitor for progression.    Dr. Oneil Pinal is Medical Director for [facility] Cardiac Rehabilitation.  Dr. Fuad Aleskerov is Medical Director for [facility] Pulmonary Rehabilitation.

## 2024-06-25 ENCOUNTER — Encounter: Payer: Self-pay | Admitting: Nurse Practitioner

## 2024-06-25 DIAGNOSIS — M79605 Pain in left leg: Secondary | ICD-10-CM | POA: Insufficient documentation

## 2024-06-26 ENCOUNTER — Encounter: Admitting: Emergency Medicine

## 2024-06-26 ENCOUNTER — Encounter (INDEPENDENT_AMBULATORY_CARE_PROVIDER_SITE_OTHER): Payer: Self-pay | Admitting: Nurse Practitioner

## 2024-06-26 DIAGNOSIS — J449 Chronic obstructive pulmonary disease, unspecified: Secondary | ICD-10-CM | POA: Diagnosis not present

## 2024-06-26 NOTE — Progress Notes (Signed)
 Daily Session Note  Patient Details  Name: Angela Jensen MRN: [id number] Date of Birth: Dec 12, 1955 Referring Provider:   [facility] from 04/13/2024 in [facility]  Referring Provider Parris Manna, MD    Encounter Date: 06/26/2024  Check In:  Session Check In - 06/26/24 1348       Check-In   Supervising physician immediately available to respond to emergencies See telemetry face sheet for immediately available ER MD    Location [facility]-Card[facility] & Pul[facility]    Staff Present Leita Franks RN,BSN;Maxon Conetta BS, Exercise Physiologist;Noah Tickle, BS, Exercise Physiologist;Margaret Best, MS, Exercise Physiologist    Virtual Visit No    Medication changes reported     No    Fall or balance concerns reported    No    Warm-up and Cool-down Performed on first and last piece of equipment    Resistance Training Performed Yes    VAD Patient? No    PAD/SET Patient? No      Pain Assessment   Currently in Pain? No/denies             Social History   Tobacco Use  Smoking Status Former   Current packs/day: 0.00   Average packs/day: 1 pack/day for 37.0 years (37.0 ttl pk-yrs)   Types: Cigarettes   Start date: 02/06/1973   Quit date: 02/06/2010   Years since quitting: 14.3  Smokeless Tobacco Former   Types: Snuff    Goals Met:  Proper associated with RPD/PD & O2 Sat Independence with exercise equipment Using PLB without cueing & demonstrates good technique Exercise tolerated well No report of concerns or symptoms today Strength training completed today  Goals Unmet:  Not Applicable  Comments: Pt able to follow exercise prescription today without complaint.  Will continue to monitor for progression.    Dr. Oneil Pinal is Medical Director for [facility] Card[facility].  Dr. Fuad Aleskerov is Medical Director for [facility].

## 2024-06-26 NOTE — Progress Notes (Signed)
 Subjective:    Patient ID: [name] Brain, female    DOB: February 11, 1956, 68 y.o.   MRN: [id number] Chief Complaint  Patient presents with   Follow-up    3 month follow up + ABI + L art. Ref. Abernathy    The patient returns today for follow-up to discuss her left lower extremity.  She was initially scheduled to have an angiogram due to persistent leg pain.  However the patient study showed normal ABIs with widely patent vessels throughout the left lower extremity.  The patient's largest complaint of pain is actually a numbness and burning and stinging.  The patient denies any claudication symptoms.  This certainly sounds very similar to ischemic symptoms but her foot is actually warm at this time.  Prior to this pain the patient had a profoundly ischemic left lower extremity for which she underwent endarterectomy.  This pain occurred following revascularization.  Based on her description of symptoms, her studies and the timing I suspect that what the patient is actually dealing with a significant neuropathic pain problem not leg ischemia    Review of Systems  Cardiovascular:  Positive for leg swelling.  All other systems reviewed and are negative.      Objective:   Physical Exam Vitals reviewed.  HENT:     Head: Normocephalic.  Cardiovascular:     Rate and Rhythm: Normal rate.  Pulmonary:     Comments: Home O2 Skin:    General: Skin is warm and dry.  Neurological:     Mental Status: She is alert and oriented to person, place, and time.  Psychiatric:        Mood and Affect: Mood normal.        Behavior: Behavior normal.        Thought Content: Thought content normal.        Judgment: Judgment normal.     BP 130/78 (BP Location: Left Arm, Patient Position: Sitting, Cuff Size: Normal)   Pulse 90   Resp 16   Ht 4' 11 (1.499 m)   Wt 116 lb 6.4 oz (52.8 kg)   BMI 23.51 kg/m   Past Medical History:  Diagnosis Date   Anemia    Arthritis    Asthma    Atherosclerosis of  native arteries of extremity with intermittent claudication 05/26/2016   Cancer (HCC) 2012   Right Lung CA   COPD (chronic obstructive pulmonary disease) (HCC)    Depression    Diabetes mellitus without complication (HCC)    Patient takes Janumet   Essential hypertension 05/26/2016   Heart failure (HCC) 2022   Hydropneumothorax 05/03/2020   Hypercholesteremia    Oxygen  dependent    2L at nite    PAD (peripheral artery disease) 06/22/2016   Peripheral vascular disease    Personal history of radiation therapy    Shortness of breath dyspnea    with exertion    Sialolithiasis    Sleep apnea    Wears dentures    full upper and lower    Social History   Socioeconomic History   Marital status: Widowed    Spouse name: Not on file   Number of children: Not on file   Years of education: Not on file   Highest education level: Not on file  Occupational History   Not on file  Tobacco Use   Smoking status: Former    Current packs/day: 0.00    Average packs/day: 1 pack/day for 37.0 years (37.0 ttl pk-yrs)  Types: Cigarettes    Start date: 02/06/1973    Quit date: 02/06/2010    Years since quitting: 14.3   Smokeless tobacco: Former    Types: Snuff  Vaping Use   Vaping status: Never Used  Substance and Sexual Activity   Alcohol use: Not Currently    Alcohol/week: 5.0 standard drinks of alcohol    Types: 5 Cans of beer per week    Comment: /h x of alcohol abuse -stopped 2012- now drinks 5 beer per week   Drug use: Not Currently    Types: Marijuana, Crack cocaine, Cocaine    Comment: hx of cocaine use- last use 2015; last use marijuana6/22/19,   Sexual activity: Yes  Other Topics Concern   Not on file  Social History Narrative   Lives with Significant Other x 43 years   Social Drivers of Corporate Investment Banker Strain: Medium Risk (06/04/2024)   Received [facility]   Overall Financial Resource Strain (CARDIA)    How hard is it for you to pay for the very  basics like food, housing, medical care, and heating?: Somewhat hard  Food Insecurity: No Food Insecurity (06/04/2024)   Received from [facility]   Hunger Vital Sign    Within the past 12 months, you worried that your food would run out before you got the money to buy more.: Never true    Within the past 12 months, the food you bought just didn't last and you didn't have money to get more.: Never true  Transportation Needs: No Transportation Needs (06/04/2024)   Received from [facility] - Transportation    Lack of Transportation (Medical): No    Lack of Transportation (Non-Medical): No  Physical Activity: Not on file  Stress: Not on file  Social Connections: Moderately Integrated (04/18/2024)   Social Connection and Isolation Panel    Frequency of Communication with Friends and Family: More than three times a week    Frequency of Social Gatherings with Friends and Family: Three times a week    Attends Religious Services: More than 4 times per year    Active Member of Clubs or Organizations: No    Attends Banker Meetings: Never    Marital Status: Living with partner  Intimate Partner Violence: Not At Risk (04/18/2024)   Humiliation, Afraid, Rape, and Kick questionnaire    Fear of Current or Ex-Partner: No    Emotionally Abused: No    Physically Abused: No    Sexually Abused: No    Past Surgical History:  Procedure Laterality Date   APPLICATION OF CELL SAVER Left 01/21/2024   Procedure: APPLICATION OF CELL SAVER;  Surgeon: Marea Selinda RAMAN, MD;  Location: [facility];  Service: Vascular;  Laterality: Left;   CESAREAN SECTION     x3   COLONOSCOPY N/A 04/03/2024   Procedure: COLONOSCOPY;  Surgeon: Jinny Carmine, MD;  Location: [facility];  Service: Endoscopy;  Laterality: N/A;   COLONOSCOPY WITH PROPOFOL  N/A 06/25/2015   Procedure: COLONOSCOPY WITH PROPOFOL ;  Surgeon: Carmine Jinny, MD;  Location: [facility];  Service: Endoscopy;  Laterality: N/A;    COLONOSCOPY WITH PROPOFOL  N/A 07/26/2020   Procedure: COLONOSCOPY WITH PROPOFOL ;  Surgeon: Jinny Carmine, MD;  Location: [facility];  Service: Endoscopy;  Laterality: N/A;   CYST REMOVAL LEG     and on shoulder    ENDARTERECTOMY FEMORAL Left 01/21/2024   Procedure: LEFT COMMON, SUPERFICIAL FEMORAL AND PROFUNDA FEMORIS ENDARTERECTOMY WITH  BOVINE PERICARDIAL PATCH ANGIOPLASTY;  Surgeon: Marea Selinda RAMAN, MD;  Location: [facility];  Service: Vascular;  Laterality: Left;   ESOPHAGOGASTRODUODENOSCOPY (EGD) WITH PROPOFOL  N/A 07/26/2020   Procedure: ESOPHAGOGASTRODUODENOSCOPY (EGD) WITH PROPOFOL ;  Surgeon: Jinny Carmine, MD;  Location: [facility];  Service: Endoscopy;  Laterality: N/A;  Diabetic - oral meds   INSERTION OF ILIAC STENT Left 01/21/2024   Procedure: INSERTION, STENT, ARTERY, ILIAC;  Surgeon: Marea Selinda RAMAN, MD;  Location: [facility];  Service: Vascular;  Laterality: Left;  AND SFA STENTS   LOWER EXTREMITY ANGIOGRAPHY Left 09/29/2018   Procedure: LOWER EXTREMITY ANGIOGRAPHY;  Surgeon: Marea Selinda RAMAN, MD;  Location: [facility];  Service: Cardiovascular;  Laterality: Left;   LOWER EXTREMITY ANGIOGRAPHY Left 07/29/2023   Procedure: Lower Extremity Angiography;  Surgeon: Marea Selinda RAMAN, MD;  Location: [facility];  Service: Cardiovascular;  Laterality: Left;   LOWER EXTREMITY ANGIOGRAPHY Right 12/20/2023   Procedure: Lower Extremity Angiography;  Surgeon: Marea Selinda RAMAN, MD;  Location: [facility];  Service: Cardiovascular;  Laterality: Right;   LOWER EXTREMITY ANGIOGRAPHY Left 01/17/2024   Procedure: Lower Extremity Angiography;  Surgeon: Marea Selinda RAMAN, MD;  Location: [facility];  Service: Cardiovascular;  Laterality: Left;   LUNG BIOPSY  12/30/2011   has lung spots   PACEMAKER IMPLANT  07/14/2021   PACEMAKER LEADLESS INSERTION N/A 07/14/2021   Procedure: PACEMAKER LEADLESS INSERTION;  Surgeon: Ammon Blunt, MD;  Location: [facility];   Service: Cardiovascular;  Laterality: N/A;   PERIPHERAL VASCULAR CATHETERIZATION Left 06/01/2016   Procedure: Lower Extremity Angiography;  Surgeon: Selinda RAMAN Marea, MD;  Location: [facility];  Service: Cardiovascular;  Laterality: Left;   PERIPHERAL VASCULAR CATHETERIZATION N/A 06/01/2016   Procedure: Abdominal Aortogram w/Lower Extremity;  Surgeon: Selinda RAMAN Marea, MD;  Location: [facility];  Service: Cardiovascular;  Laterality: N/A;   PERIPHERAL VASCULAR CATHETERIZATION  06/01/2016   Procedure: Lower Extremity Intervention;  Surgeon: Selinda RAMAN Marea, MD;  Location: [facility];  Service: Cardiovascular;;   PERIPHERAL VASCULAR CATHETERIZATION Right 06/08/2016   Procedure: Lower Extremity Angiography;  Surgeon: Selinda RAMAN Marea, MD;  Location: [facility];  Service: Cardiovascular;  Laterality: Right;   PERIPHERAL VASCULAR CATHETERIZATION  06/08/2016   Procedure: Lower Extremity Intervention;  Surgeon: Selinda RAMAN Marea, MD;  Location: [facility];  Service: Cardiovascular;;   POLYPECTOMY  04/03/2024   Procedure: POLYPECTOMY, INTESTINE;  Surgeon: Jinny Carmine, MD;  Location: [facility];  Service: Endoscopy;;   SUBMANDIBULAR GLAND EXCISION Left 12/06/2020   Procedure: EXCISION SUBMANDIBULAR GLAND;  Surgeon: Herminio Miu, MD;  Location: [facility];  Service: ENT;  Laterality: Left;  needs to be first case Diabetic - diet controlled   TEMPORARY PACEMAKER N/A 07/11/2021   Procedure: TEMPORARY PACEMAKER;  Surgeon: Ammon Blunt, MD;  Location: [facility];  Service: Cardiovascular;  Laterality: N/A;    Family History  Problem Relation Age of Onset   Diabetes Mother    Hypercholesterolemia Mother    Lung cancer Father    Diabetes Sister    Diabetes Sister    Hypertension Sister    Diabetes Maternal Grandmother    Diabetes Paternal Grandmother    Heart attack Brother    Coronary artery disease Brother    Vascular Disease Brother     Heart attack Brother    Breast cancer Neg Hx     Allergies  Allergen Reactions   Iodinated Contrast  Media Itching   Trelegy Ellipta  [Fluticasone -Umeclidin-Vilant]     Had breathing issues   Bactoshield Chg [Chlorhexidine  Gluconate] Itching and Other (See Comments)    Dry. Flaking, peeling skin       Latest Ref Rng & Units 04/19/2024    4:13 AM 04/18/2024    6:22 AM 04/12/2024    3:12 AM  CBC  WBC 4.0 - 10.5 K/uL 8.1  9.6  7.9   Hemoglobin 12.0 - 15.0 g/dL 89.1  88.9  9.4   Hematocrit 36.0 - 46.0 % 35.0  36.5  30.4   Platelets 150 - 400 K/uL 263  195  261       CMP     Component Value Date/Time   NA 140 04/19/2024 0413   NA 144 10/04/2023 1047   NA 141 04/17/2014 1055   K 4.4 04/19/2024 0413   K 3.9 04/17/2014 1055   CL 91 (L) 04/19/2024 0413   CL 102 04/17/2014 1055   CO2 32 04/19/2024 0413   CO2 30 04/17/2014 1055   GLUCOSE 290 (H) 04/19/2024 0413   GLUCOSE 201 (H) 04/17/2014 1055   BUN 38 (H) 04/19/2024 0413   BUN 12 10/04/2023 1047   BUN 10 04/17/2014 1055   CREATININE 0.84 04/19/2024 0413   CREATININE 0.84 07/06/2023 1016   CREATININE 0.90 04/17/2014 1055   CALCIUM  9.1 04/19/2024 0413   CALCIUM  9.1 04/17/2014 1055   PROT 6.6 04/18/2024 0622   PROT 6.9 10/04/2023 1047   PROT 8.1 04/17/2014 1055   ALBUMIN 3.5 04/18/2024 0622   ALBUMIN 4.4 10/04/2023 1047   ALBUMIN 4.1 04/17/2014 1055   AST 45 (H) 04/18/2024 0622   AST 24 07/06/2023 1016   ALT 38 04/18/2024 0622   ALT 27 07/06/2023 1016   ALT 29 04/17/2014 1055   ALKPHOS 68 04/18/2024 0622   ALKPHOS 73 04/17/2014 1055   BILITOT 0.6 04/18/2024 0622   BILITOT 0.2 10/04/2023 1047   BILITOT 0.4 07/06/2023 1016   EGFR 97 10/04/2023 1047   GFRNONAA >60 04/19/2024 0413   GFRNONAA >60 07/06/2023 1016   GFRNONAA >60 04/17/2014 1055     VAS US  ABI WITH/WO TBI Result Date: 06/01/2024  LOWER EXTREMITY DOPPLER STUDY Patient Name:  TARONDA COMACHO  Date of Exam:   05/31/2024 Medical Rec #: [id number]     Accession  #:    [id number] Date of Birth: [birth year]     Patient Gender: F Patient Age:   31 years Exam Location:  [facility] Procedure:      VAS US  ABI WITH/WO TBI Referring Phys: Selinda Gu --------------------------------------------------------------------------------  Indications: Claudication, and peripheral artery disease. Other Factors: Left SFA stents are known to be occluded.                Patient complains of constant numbness in left foot.                 07/29/2023: Aortogram and selective Left Lower                ExtremityAngiogram. Mechanical thrombectomy of the Left SFA and                Popliteal Artery with the Rota Rex device. Stent placement to the                Proximal Left SFA with 6 mm diameter by 5 cm length Viabahn  stent. Stent placement to the Left Popliteal Artery above the                knee with 6 mm diameter by 5 cm length Viabahn stent.                 12/20/2023: Aortogram and Selective Right Lower                ExtremityAngiogram. PTA of the Right SFA with 4 mm diameter by 22                cm length Lutonix drug coated angioplasty balloon. Stent                placement to the Right SFA with 6 mm by 20 cm length Life stent.  Vascular Interventions: 06/01/16: Left distal SFA/popliteal arteries PTAs with                         SFA stent x2;                         06/08/16: Left SFA PTA;                         09/29/18: Left SFA stent x2;                          01/17/2024: Aortogram and Selective Left Lower Extremity                         Angiogram.                          01/21/2024: Left CFA,PFA and SFA Endartectomies and                         patch angioplasty. Stent placement to the Left EIA with                         8 mm diameter by 7.5 cm length Viabahn stent. Mechanical                         thrombectomy to the Left SFA and Popliteal Artery with                         the 8 French Rota Rex device. Stent placement to                          proximal Left SFA with 7 mm diameter by 10 cm length                         Viabahn stent. Stent placement to the Left most distal                         SFA and Popliteal Artery with 6 mm diameter by 15 cm                         length Viabahn stent. Angioplasty of the Left  Tibioperoneal trunk and PTA with 2.5 mm diameter                         angioplasty balloon proximally and then the entirety of                         the Left Posterior Tibial Artery with 2 mm diameter                         diameter angioplasty. Mechanical Thrombectomy of the                         Left Tibioperoneal trunk and Posterior Tibial Arteries                         withe the penumbra 6 bolt device. Instillation of 4 mg                         of TPA in the Left Posteriot tibial Artery distally for                         residual thrombus after above procedures. Comparison Study: 02/21/2024 Performing Technologist: Leafy Gibes RVS  Examination Guidelines: A complete evaluation includes at minimum, Doppler waveform signals and systolic blood pressure reading at the level of bilateral brachial, anterior tibial, and posterior tibial arteries, when vessel segments are accessible. Bilateral testing is considered an integral part of a complete examination. Photoelectric Plethysmograph (PPG) waveforms and toe systolic pressure readings are included as required and additional duplex testing as needed. Limited examinations for reoccurring indications may be performed as noted.  ABI Findings: +---------+------------------+-----+---------+--------+ Right    Rt Pressure (mmHg)IndexWaveform Comment  +---------+------------------+-----+---------+--------+ Brachial 131                                      +---------+------------------+-----+---------+--------+ ATA      94                0.72 biphasic          +---------+------------------+-----+---------+--------+ PTA      118                0.90 triphasic         +---------+------------------+-----+---------+--------+ Great Toe122               0.93 Normal            +---------+------------------+-----+---------+--------+ +---------+------------------+-----+---------+-------+ Left     Lt Pressure (mmHg)IndexWaveform Comment +---------+------------------+-----+---------+-------+ Brachial 124                                     +---------+------------------+-----+---------+-------+ ATA      113               0.86 biphasic         +---------+------------------+-----+---------+-------+ PTA      0                 0.00 absent           +---------+------------------+-----+---------+-------+ PERO     135  1.03 triphasic        +---------+------------------+-----+---------+-------+ Great Toe146               1.11 Normal           +---------+------------------+-----+---------+-------+ +-------+-----------+-----------+------------+------------+ ABI/TBIToday's ABIToday's TBIPrevious ABIPrevious TBI +-------+-----------+-----------+------------+------------+ Right  .90        .93        .92         .99          +-------+-----------+-----------+------------+------------+ Left   1.03       1.11       .92         .90          +-------+-----------+-----------+------------+------------+ Left ABIs and TBIs appear increased compared to prior study on 02/21/2024. Right ABIs appear increased compared to prior study on 02/21/2024. Rt TBIs appear to be decreased compared to preior study on 02/21/2024.  Summary: Right: Resting right ankle-brachial index indicates mild right lower extremity arterial disease. The right toe-brachial index is normal.  Left: Resting left ankle-brachial index is within normal range. The left toe-brachial index is normal.  *See table(s) above for measurements and observations.  Electronically signed by Selinda Gu MD on 06/01/2024 at 11:52:48 AM.    Final        Assessment  & Plan:   1. Peripheral arterial disease with history of revascularization (Primary) Based on the patient's studies, she is well revascularized.  We discussed her angiogram and intervention for her left lower extremity, however I do not feel that doing this would cause any significant improvement of her symptoms.  In fact, we discussed that this may largely be diagnostic.  Following discussion we will not move forward with intervention but will instead have her return in 3 months with noninvasive studies  2. Essential hypertension Continue antihypertensive medications as already ordered, these medications have been reviewed and there are no changes at this time.Continue antihypertensive medications as already ordered, these medications have been reviewed and there are no changes at this time.  3. Left leg pain I strongly suspect that the patient's pain is neuropathic in nature from being profoundly ischemic.  In addition her thigh discomfort is related to the surgery itself.  She notes that the Lyrica  previously placed on is somewhat helpful but does not completely help the pain.  Based on the I have increased her Lyrica  to see if it also improves her symptoms.  I did have a frank discussion with the patient and the fact that her neuropathy pains will probably never completely be resolved, however our goal is to get her to a place where they are tolerable for her.   Current Outpatient Medications on File Prior to Visit  Medication Sig Dispense Refill   ACCU-CHEK GUIDE TEST test strip USE TO CHECK BLOOD SUGAR 5 TIMES DAILY 450 each 0   Accu-Chek Softclix Lancets lancets Use 1 lancet 3 times daily to check glucose for diabetes 300 each 1   albuterol  (PROVENTIL ) (5 MG/ML) 0.5% nebulizer solution Take 2.5 mg by nebulization every 6 (six) hours as needed for wheezing or shortness of breath.     albuterol  (VENTOLIN  HFA) 108 (90 Base) MCG/ACT inhaler INHALE 2 PUFFS BY MOUTH EVERY 6 HOURS AS NEEDED FOR  WHEEZING OR SHORTNESS OF BREATH 3 each 5   ALPRAZolam  (XANAX ) 1 MG tablet Take 1 tablet (1 mg total) by mouth 2 (two) times daily as needed for anxiety or sleep. 60 tablet 1   aspirin  EC 81 MG  tablet Take 1 tablet (81 mg total) by mouth daily. Swallow whole. 150 tablet 2   atorvastatin  (LIPITOR) 10 MG tablet Take 1 tablet (10 mg total) by mouth daily. 30 tablet 11   Blood Glucose Monitoring Suppl (ACCU-CHEK GUIDE) w/Device KIT Use as directed Dx e11.65 1 kit 0   budesonide -glycopyrrolate -formoterol  (BREZTRI ) 160-9-4.8 MCG/ACT AERO inhaler Inhale 2 puffs into the lungs 2 (two) times daily. 10.7 g 11   calcium  carbonate (OSCAL) 1500 (600 Ca) MG TABS tablet Take 600 mg of elemental calcium  by mouth 2 (two) times daily with a meal.     clopidogrel  (PLAVIX ) 75 MG tablet Take 1 tablet (75 mg total) by mouth daily. 90 tablet 3   cyclobenzaprine  (FLEXERIL ) 10 MG tablet Take 1 tablet (10 mg total) by mouth at bedtime. Take one tab po qhs for back spasm prn only 30 tablet 3   escitalopram  (LEXAPRO ) 10 MG tablet Take 1 tablet by mouth once daily 90 tablet 0   ferrous sulfate  325 (65 FE) MG tablet Take 1 tablet (325 mg total) by mouth daily with breakfast.     furosemide  (LASIX ) 20 MG tablet Take 1 tablet (20 mg total) by mouth daily as needed. 30 tablet 5   insulin  glargine, 1 Unit Dial , (TOUJEO ) 300 UNIT/ML Solostar Pen Inject 10 Units into the skin daily. 1.5 mL 11   Insulin  Pen Needle (RELION PEN NEEDLES) 31G X 6 MM MISC USE 1  pen needle with insulin  pen ONCE DAILY for diabetes 50 each 11   ipratropium-albuterol  (DUONEB) 0.5-2.5 (3) MG/3ML SOLN USE 1 VIAL IN NEBULIZER EVERY 6 HOURS AS NEEDED 360 mL 0   losartan  (COZAAR ) 50 MG tablet Take 1 tablet (50 mg total) by mouth daily. 90 tablet 1   metFORMIN  (GLUCOPHAGE -XR) 500 MG 24 hr tablet TAKE 2 TABLETS BY MOUTH ONCE DAILY WITH BREAKFAST 180 tablet 0   metoprolol  succinate (TOPROL -XL) 50 MG 24 hr tablet Take 1 tablet (50 mg total) by mouth daily. Take with or  immediately following a meal. May take 1/2 tablet if BP is less than 120/80. 90 tablet 3   oxyCODONE -acetaminophen  (PERCOCET) 7.5-325 MG tablet Take 1 tablet by mouth 2 (two) times daily as needed for severe pain (pain score 7-10). 60 tablet 0   [START ON 07/13/2024] oxyCODONE -acetaminophen  (PERCOCET) 7.5-325 MG tablet Take 1 tablet by mouth 2 (two) times daily as needed for severe pain (pain score 7-10). 60 tablet 0   OXYGEN  Inhale 4 L into the lungs. PT USES [facility] FOR OXYGEN      pantoprazole  (PROTONIX ) 40 MG tablet Take 1 tablet (40 mg total) by mouth daily. 90 tablet 3   potassium chloride  (KLOR-CON ) 10 MEQ tablet TAKE 1 TABLET BY MOUTH EVERY OTHER DAY 45 tablet 0   vitamin B-12 (CYANOCOBALAMIN ) 100 MCG tablet Take 100 mcg by mouth daily.     VITAMIN D , CHOLECALCIFEROL , PO Take 1 tablet by mouth in the morning.     No current facility-administered medications on file prior to visit.    There are no Patient Instructions on file for this visit. Return in about 8 weeks (around 08/15/2024).   Delante Karapetyan E Desha Bitner, NP

## 2024-06-27 ENCOUNTER — Inpatient Hospital Stay (HOSPITAL_BASED_OUTPATIENT_CLINIC_OR_DEPARTMENT_OTHER): Admitting: Oncology

## 2024-06-27 ENCOUNTER — Inpatient Hospital Stay: Attending: Oncology

## 2024-06-27 ENCOUNTER — Ambulatory Visit

## 2024-06-27 ENCOUNTER — Encounter: Payer: Self-pay | Admitting: Oncology

## 2024-06-27 VITALS — BP 137/90 | HR 102 | Temp 98.4°F | Resp 18 | Wt 118.7 lb

## 2024-06-27 DIAGNOSIS — D508 Other iron deficiency anemias: Secondary | ICD-10-CM

## 2024-06-27 DIAGNOSIS — C3491 Malignant neoplasm of unspecified part of right bronchus or lung: Secondary | ICD-10-CM

## 2024-06-27 DIAGNOSIS — J9611 Chronic respiratory failure with hypoxia: Secondary | ICD-10-CM

## 2024-06-27 DIAGNOSIS — Z923 Personal history of irradiation: Secondary | ICD-10-CM | POA: Diagnosis not present

## 2024-06-27 DIAGNOSIS — Z85118 Personal history of other malignant neoplasm of bronchus and lung: Secondary | ICD-10-CM | POA: Insufficient documentation

## 2024-06-27 DIAGNOSIS — Z801 Family history of malignant neoplasm of trachea, bronchus and lung: Secondary | ICD-10-CM | POA: Insufficient documentation

## 2024-06-27 DIAGNOSIS — D509 Iron deficiency anemia, unspecified: Secondary | ICD-10-CM | POA: Diagnosis not present

## 2024-06-27 DIAGNOSIS — Z87891 Personal history of nicotine dependence: Secondary | ICD-10-CM | POA: Insufficient documentation

## 2024-06-27 DIAGNOSIS — D472 Monoclonal gammopathy: Secondary | ICD-10-CM | POA: Insufficient documentation

## 2024-06-27 LAB — CBC WITH DIFFERENTIAL (CANCER CENTER ONLY)
Abs Immature Granulocytes: 0.03 K/uL (ref 0.00–0.07)
Basophils Absolute: 0 K/uL (ref 0.0–0.1)
Basophils Relative: 0 %
Eosinophils Absolute: 0 K/uL (ref 0.0–0.5)
Eosinophils Relative: 0 %
HCT: 44.6 % (ref 36.0–46.0)
Hemoglobin: 13 g/dL (ref 12.0–15.0)
Immature Granulocytes: 0 %
Lymphocytes Relative: 5 %
Lymphs Abs: 0.4 K/uL — ABNORMAL LOW (ref 0.7–4.0)
MCH: 26.5 pg (ref 26.0–34.0)
MCHC: 29.1 g/dL — ABNORMAL LOW (ref 30.0–36.0)
MCV: 90.8 fL (ref 80.0–100.0)
Monocytes Absolute: 0.2 K/uL (ref 0.1–1.0)
Monocytes Relative: 2 %
Neutro Abs: 7.3 K/uL (ref 1.7–7.7)
Neutrophils Relative %: 93 %
Platelet Count: 225 K/uL (ref 150–400)
RBC: 4.91 MIL/uL (ref 3.87–5.11)
RDW: 14.2 % (ref 11.5–15.5)
WBC Count: 8 K/uL (ref 4.0–10.5)
nRBC: 0 % (ref 0.0–0.2)

## 2024-06-27 LAB — CMP (CANCER CENTER ONLY)
ALT: 17 U/L (ref 0–44)
AST: 21 U/L (ref 15–41)
Albumin: 4.8 g/dL (ref 3.5–5.0)
Alkaline Phosphatase: 73 U/L (ref 38–126)
Anion gap: 11 (ref 5–15)
BUN: 15 mg/dL (ref 8–23)
CO2: 32 mmol/L (ref 22–32)
Calcium: 9.8 mg/dL (ref 8.9–10.3)
Chloride: 92 mmol/L — ABNORMAL LOW (ref 98–111)
Creatinine: 0.64 mg/dL (ref 0.44–1.00)
GFR, Estimated: >60 mL/min (ref >60)
Glucose, Bld: 249 mg/dL — ABNORMAL HIGH (ref 70–99)
Potassium: 4.3 mmol/L (ref 3.5–5.1)
Sodium: 135 mmol/L (ref 135–145)
Total Bilirubin: 0.8 mg/dL (ref 0.0–1.2)
Total Protein: 8.9 g/dL — ABNORMAL HIGH (ref 6.5–8.1)

## 2024-06-27 LAB — IRON AND TIBC
Iron: 108 ug/dL (ref 28–170)
Saturation Ratios: 24 % (ref 10.4–31.8)
TIBC: 456 ug/dL — ABNORMAL HIGH (ref 250–450)
UIBC: 348 ug/dL

## 2024-06-27 LAB — FERRITIN: Ferritin: 145 ng/mL (ref 11–307)

## 2024-06-27 NOTE — Assessment & Plan Note (Signed)
Stable.  Continue oxygen supplementation.

## 2024-06-27 NOTE — Progress Notes (Signed)
 Hematology/Oncology Progress note [phone] [phone]     Chief Complaint: Angela Jensen is a 68 y.o. female presents follow-up for  history of stage I lung cancer s/p radiation and iron  deficiency anemia   ASSESSMENT & PLAN:   MGUS (monoclonal gammopathy of unknown significance) IgG kappa MGUS, baseline SPEP M protien 0.2.  Lab Results  Component Value Date   MPROTEIN 0.4 (H) 10/08/2023   KPAFRELGTCHN 17.9 10/08/2023   LAMBDASER 18.4 10/08/2023   KAPLAMBRATIO 0.97 10/08/2023  M protein and light chain ratio are pending.  I recommend observation. Check SPEP and light chain ratio every 6 months.    Chronic respiratory failure with hypoxia (HCC) Stable.  Continue oxygen  supplementation.  IDA (iron  deficiency anemia) Lab Results  Component Value Date   HGB 13.0 06/27/2024   TIBC 456 (H) 06/27/2024   IRONPCTSAT 24 06/27/2024   FERRITIN 145 06/27/2024    Both hemoglobin and iron  panel have normalized.  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 -Recurrence-SBRT completed 06/11/2020.NGS showed TMB >10 muts/mb, MS stable, KRAS G12V, MYC amplification,STK11 S22fs*47, FUBP1 splice sit 473+1G>T June 2025  CT scan results reviewed and discussed with patient, Right lower lobe infiltrate of a chronic nature. Related to prior radiation   Oct 2025 [facility] CTA showed consolidation within the right lower lobe, likely related to progression of radiation fibrosis.  Given her extensive history of smoking and history of lung cancer, I recommend to continue annual CT without contrast for screening   Orders Placed This Encounter  Procedures   CMP (Cancer Center only)    Standing Status:   Future    Expected Date:   12/25/2024    Expiration Date:   03/25/2025   CBC with Differential (Cancer Center Only)    Standing Status:    Future    Expected Date:   12/25/2024    Expiration Date:   03/25/2025   Multiple Myeloma Panel (SPEP&IFE w/QIG)    Standing Status:   Future    Expected Date:   12/25/2024    Expiration Date:   03/25/2025   Kappa/lambda light chains    Standing Status:   Future    Expected Date:   12/25/2024    Expiration Date:   03/25/2025   Ferritin    Standing Status:   Future    Expected Date:   12/25/2024    Expiration Date:   03/25/2025   Iron  and TIBC    Standing Status:   Future    Expected Date:   12/25/2024    Expiration Date:   03/25/2025   Follow-up in 6 months All questions were answered. The patient knows to call the clinic with any problems, questions or concerns.  Zelphia Cap, MD, PhD [facility] Hematology Oncology 06/27/2024    PERTINENT ONCOLOGY HISTORY Patient previously followed up by Dr.Corcoran, patient switched care to me on 03/20/21 Extensive medical record review was performed by me  # Stage I RUL adenocarcinoma  s/p IMRT in 02/2012.   She was not a good surgery candidate secondary to her lung function (FEV1 28%).  CT guided biopsy of the superior and inferior RUL nodules on 12/30/2011 revealed well differentiated adenocarcinoma of lung primary.  EGFR testing revealed no mutation.  She received IMRT 6000 cGy to both nodules from 01/26/2012 - 03/10/2012.   In 04/2015, there was an attempt at radiofrequency ablation of  1 enlarging RUL nodule.  At the time of the procedure, the nodule had shrunk to 5 mm from 9 mm.     # Stage I RLL adenocarcinoma 2019   CT guided RLL biopsy on 02/09/2018 revealed adenocarcinoma of lung origin, lepidic and papillary patterns.   She received 6000 cGy in 5 fractions to RLL nodule from 03/22/2018 - 04/05/2018.   Chest CT on 03/04/2020 revealed enlarging and new pulmonary nodules suspicious for metastatic disease or metachronous primary.  PET scan on 03/19/2020 revealed that the right lower lobe pulmonary nodules did not demonstrate any hypermetabolism to  suggest neoplasm. However, these were somewhat worrisome; recommendation was close CT surveillance. There was no mediastinal or hilar lymphadenopathy.  Chest CT  on 09/11/2020 revealed interval treatment of the right lower lobe nodules. These nodules were partly obscured by adjacent parenchymal opacity, although appear decreased in size.  CT guided RLL biopsy on 04/24/2020 revealed adenocarcinoma of the lung with papillary and solid patterns. 05/28/2020 - 06/11/2020  SBRT to the right lung. She received 60 Gy in 5 fractions.  She has an elevated CEA of unclear etiology.  CEA was 8.1 on 01/31/2016, 9.2 on 03/03/2016, 7.9 on 10/12/2017, and 7.9 on 01/19/2019.   Colonoscopy on 06/25/2015 revealed a 4 mm polyp in the sigmoid colon.  Pathology revealed a tubular adenoma without evidence of dysplasia or malignancy.   Pelvic MRI on 04/30/2016 revealed a 3.4 x 2.5 cm ovoid mass in the LEFT adnexa with signal characteristics identical to the intramural leiomyomas and consistent with an exophytic leiomyoma.  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  Endometrium was normal with a normal junctional zone.  Ovaries were not identified.  # iron  deficiency anemia.  She required 2 units of PRBCs on 04/04/2020.  Colonoscopy on 06/25/2015 revealed one 4 mm polyp (tubular adenoma) in the sigmoid colon. There were non-bleeding internal hemorrhoids.  Colonoscopy on 07/26/2020 revealed diverticulosis in the entire examined colon. There were non-bleeding internal hemorrhoids. EGD on 07/26/2020 revealed erosive gastropathy with no bleeding and no stigmata of recent bleeding.   07/11/2021, patient was hospitalized and a CT angio chest PE with contrast was ordered and there was slight increase in size.  3 over several months of larger than 1 cm nodular-like densities along the right minor and major fissure in the region of prior radiation therapy.  Recurrent malignancy cannot be excluded.   12/29 2022, a PET scan was obtained  for further evaluation.  PET scan showed stable scarring type changes involving the right lung.  No hypermetabolic pulmonary lesions to suggest recurrent or metastatic tumor.  02/20/2022, CT chest with contrast showed continued interval progression of the consolidative plaque-like opacity in the right lower lobe.  This area was previously FDG negative on PET scan in December 2022.  New 5 mm left lower lobe pulmonary nodule.  09/21/2022, CT chest with contrast showed 1. Nondisplaced mildly-comminuted fracture of the posterolateral right ninth rib. 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 No pneumothorax. 2. Stable examination demonstrating chronic postradiation changes in the right lung with no definitive evidence to suggest locally recurrent disease or definite metastatic disease in the thorax.  3. Diffuse bronchial wall thickening with moderate centrilobular and paraseptal emphysema; imaging findings suggestive of underlying COPD. 4. Aortic atherosclerosis,  03/24/2023 CT chest w contrast  1. New 4 mm apical segment right upper lobe nodule, worrisome for disease progression. 2. Pathologic right rib fractures, as before,  possibly radiation related. 3. Aortic atherosclerosis (ICD10-I70.0). Coronary artery calcification. 4.  Emphysema  INTERVAL HISTORY Angela Jensen is a 68 y.o. female who has above history reviewed by me today presents for follow up visit for iron  deficiency anemia, history of lung cancer  She reports feeling well, chronic SOB, on nasal cannula oxygen .  Recent hospitalization at [facility] due to acute on chronic dyspnea. She was treated with steroid and antibiotics   Past Medical History:  Diagnosis Date   Anemia    Arthritis    Asthma    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   Cancer [facility]) 2012   Right Lung CA   COPD (chronic  obstructive pulmonary disease) (HCC)    Depression    Diabetes mellitus without complication (HCC)    Patient takes Janumet   Essential hypertension 05/26/2016   Heart failure (HCC) 2022   Hydropneumothorax 05/03/2020   Hypercholesteremia    Oxygen  dependent    2L at nite    PAD (peripheral artery disease) 06/22/2016   Peripheral vascular disease    Personal history of radiation therapy    Shortness of breath dyspnea    with exertion    Sialolithiasis    Sleep apnea    Wears dentures    full upper and lower    Past Surgical History:  Procedure Laterality Date   APPLICATION OF CELL SAVER Left 01/21/2024   Procedure: APPLICATION OF CELL SAVER;  Surgeon: Marea Selinda RAMAN, MD;  Location: [facility];  Service: Vascular;  Laterality: Left;   CESAREAN SECTION     x3   COLONOSCOPY N/A 04/03/2024   Procedure: COLONOSCOPY;  Surgeon: Jinny Carmine, MD;  Location: [facility];  Service: Endoscopy;  Laterality: N/A;   COLONOSCOPY WITH PROPOFOL  N/A 06/25/2015   Procedure: COLONOSCOPY WITH PROPOFOL ;  Surgeon: Carmine Jinny, MD;  Location: [facility];  Service: Endoscopy;  Laterality: N/A;   COLONOSCOPY WITH PROPOFOL  N/A 07/26/2020   Procedure: COLONOSCOPY WITH PROPOFOL ;  Surgeon: Jinny Carmine, MD;  Location: [facility];  Service: Endoscopy;  Laterality: N/A;   CYST REMOVAL LEG     and on shoulder    ENDARTERECTOMY FEMORAL Left 01/21/2024   Procedure: LEFT COMMON, SUPERFICIAL FEMORAL AND PROFUNDA FEMORIS ENDARTERECTOMY WITH BOVINE PERICARDIAL PATCH ANGIOPLASTY;  Surgeon: Marea Selinda RAMAN, MD;  Location: [facility];  Service: Vascular;  Laterality: Left;   ESOPHAGOGASTRODUODENOSCOPY (EGD) WITH PROPOFOL  N/A 07/26/2020   Procedure: ESOPHAGOGASTRODUODENOSCOPY (EGD) WITH PROPOFOL ;  Surgeon: Jinny Carmine, MD;  Location: [facility];  Service: Endoscopy;  Laterality: N/A;  Diabetic - oral meds   INSERTION OF ILIAC STENT Left 01/21/2024   Procedure: INSERTION, STENT, ARTERY, ILIAC;  Surgeon: Marea Selinda RAMAN, MD;  Location: [facility];  Service: Vascular;  Laterality: Left;  AND SFA STENTS   LOWER EXTREMITY ANGIOGRAPHY Left 09/29/2018   Procedure: LOWER EXTREMITY ANGIOGRAPHY;  Surgeon: Marea Selinda RAMAN, MD;  Location: [facility];  Service: Cardiovascular;  Laterality: Left;   LOWER EXTREMITY ANGIOGRAPHY Left 07/29/2023   Procedure: Lower Extremity Angiography;  Surgeon: Marea Selinda RAMAN, MD;  Location: [facility];  Service: Cardiovascular;  Laterality: Left;   LOWER EXTREMITY ANGIOGRAPHY Right 12/20/2023   Procedure: Lower Extremity Angiography;  Surgeon: Marea Selinda RAMAN, MD;  Location: [facility];  Service: Cardiovascular;  Laterality: Right;   LOWER EXTREMITY ANGIOGRAPHY Left 01/17/2024   Procedure: Lower Extremity Angiography;  Surgeon: Marea Selinda RAMAN, MD;  Location: [facility];  Service:  Cardiovascular;  Laterality: Left;   LUNG BIOPSY  12/30/2011   has lung spots   PACEMAKER IMPLANT  07/14/2021   PACEMAKER LEADLESS INSERTION N/A 07/14/2021   Procedure: PACEMAKER LEADLESS INSERTION;  Surgeon: Ammon Blunt, MD;  Location: [facility];  Service: Cardiovascular;  Laterality: N/A;   PERIPHERAL VASCULAR CATHETERIZATION Left 06/01/2016   Procedure: Lower Extremity Angiography;  Surgeon: Selinda GORMAN Gu, MD;  Location: [facility];  Service: Cardiovascular;  Laterality: Left;   PERIPHERAL VASCULAR CATHETERIZATION N/A 06/01/2016   Procedure: Abdominal Aortogram w/Lower Extremity;  Surgeon: Selinda GORMAN Gu, MD;  Location: [facility];  Service: Cardiovascular;  Laterality: N/A;   PERIPHERAL VASCULAR CATHETERIZATION  06/01/2016   Procedure: Lower Extremity Intervention;  Surgeon: Selinda GORMAN Gu, MD;  Location: [facility];  Service: Cardiovascular;;   PERIPHERAL VASCULAR CATHETERIZATION Right 06/08/2016   Procedure: Lower Extremity Angiography;  Surgeon: Selinda GORMAN Gu, MD;  Location: [facility];  Service: Cardiovascular;   Laterality: Right;   PERIPHERAL VASCULAR CATHETERIZATION  06/08/2016   Procedure: Lower Extremity Intervention;  Surgeon: Selinda GORMAN Gu, MD;  Location: [facility];  Service: Cardiovascular;;   POLYPECTOMY  04/03/2024   Procedure: POLYPECTOMY, INTESTINE;  Surgeon: Jinny Carmine, MD;  Location: [facility];  Service: Endoscopy;;   SUBMANDIBULAR GLAND EXCISION Left 12/06/2020   Procedure: EXCISION SUBMANDIBULAR GLAND;  Surgeon: Herminio Miu, MD;  Location: [facility];  Service: ENT;  Laterality: Left;  needs to be first case Diabetic - diet controlled   TEMPORARY PACEMAKER N/A 07/11/2021   Procedure: TEMPORARY PACEMAKER;  Surgeon: Ammon Blunt, MD;  Location: [facility];  Service: Cardiovascular;  Laterality: N/A;    Family History  Problem Relation Age of Onset   Diabetes Mother    Hypercholesterolemia Mother    Lung cancer Father    Diabetes Sister    Diabetes Sister    Hypertension Sister    Diabetes Maternal Grandmother    Diabetes Paternal Grandmother    Heart attack Brother    Coronary artery disease Brother    Vascular Disease Brother    Heart attack Brother    Breast cancer Neg Hx     Social History:  reports that she quit smoking about 14 years ago. Her smoking use included cigarettes. She started smoking about 51 years ago. She has a 37 pack-year smoking history. She has quit using smokeless tobacco.  Her smokeless tobacco use included snuff. She reports that she does not currently use alcohol after a past usage of about 5.0 standard drinks of alcohol per week. She reports that she does not currently use drugs after having used the following drugs: Marijuana, Crack cocaine, and Cocaine.   Allergies:  Allergies  Allergen Reactions   Iodinated Contrast Media Itching   Trelegy Ellipta  [Fluticasone -Umeclidin-Vilant]     Had breathing issues   Bactoshield Chg [Chlorhexidine  Gluconate] Itching and Other (See Comments)    Dry. Flaking,  peeling skin    Current Medications: Current Outpatient Medications  Medication Sig Dispense Refill   ACCU-CHEK GUIDE TEST test strip USE TO CHECK BLOOD SUGAR 5 TIMES DAILY 450 each 0   Accu-Chek Softclix Lancets lancets Use 1 lancet 3 times daily to check glucose for diabetes 300 each 1   albuterol  (PROVENTIL ) (5 MG/ML) 0.5% nebulizer solution Take 2.5 mg by nebulization every 6 (six) hours as needed for wheezing or shortness of breath.     albuterol  (VENTOLIN  HFA) 108 (90 Base) MCG/ACT inhaler  INHALE 2 PUFFS BY MOUTH EVERY 6 HOURS AS NEEDED FOR WHEEZING OR SHORTNESS OF BREATH 3 each 5   ALPRAZolam  (XANAX ) 1 MG tablet Take 1 tablet (1 mg total) by mouth 2 (two) times daily as needed for anxiety or sleep. 60 tablet 1   aspirin  EC 81 MG tablet Take 1 tablet (81 mg total) by mouth daily. Swallow whole. 150 tablet 2   atorvastatin  (LIPITOR) 10 MG tablet Take 1 tablet (10 mg total) by mouth daily. 30 tablet 11   Blood Glucose Monitoring Suppl (ACCU-CHEK GUIDE) w/Device KIT Use as directed Dx e11.65 1 kit 0   budesonide -glycopyrrolate -formoterol  (BREZTRI ) 160-9-4.8 MCG/ACT AERO inhaler Inhale 2 puffs into the lungs 2 (two) times daily. 10.7 g 11   calcium  carbonate (OSCAL) 1500 (600 Ca) MG TABS tablet Take 600 mg of elemental calcium  by mouth 2 (two) times daily with a meal.     clopidogrel  (PLAVIX ) 75 MG tablet Take 1 tablet (75 mg total) by mouth daily. 90 tablet 3   cyclobenzaprine  (FLEXERIL ) 10 MG tablet Take 1 tablet (10 mg total) by mouth at bedtime. Take one tab po qhs for back spasm prn only 30 tablet 3   escitalopram  (LEXAPRO ) 10 MG tablet Take 1 tablet by mouth once daily 90 tablet 0   ferrous sulfate  325 (65 FE) MG tablet Take 1 tablet (325 mg total) by mouth daily with breakfast.     furosemide  (LASIX ) 20 MG tablet Take 1 tablet (20 mg total) by mouth daily as needed. 30 tablet 5   insulin  glargine, 1 Unit Dial , (TOUJEO ) 300 UNIT/ML Solostar Pen Inject 10 Units into the skin daily. 1.5 mL  11   Insulin  Pen Needle (RELION PEN NEEDLES) 31G X 6 MM MISC USE 1  pen needle with insulin  pen ONCE DAILY for diabetes 50 each 11   ipratropium-albuterol  (DUONEB) 0.5-2.5 (3) MG/3ML SOLN USE 1 VIAL IN NEBULIZER EVERY 6 HOURS AS NEEDED 360 mL 0   losartan  (COZAAR ) 50 MG tablet Take 1 tablet (50 mg total) by mouth daily. 90 tablet 1   metFORMIN  (GLUCOPHAGE -XR) 500 MG 24 hr tablet TAKE 2 TABLETS BY MOUTH ONCE DAILY WITH BREAKFAST 180 tablet 0   metoprolol  succinate (TOPROL -XL) 50 MG 24 hr tablet Take 1 tablet (50 mg total) by mouth daily. Take with or immediately following a meal. May take 1/2 tablet if BP is less than 120/80. 90 tablet 3   oxyCODONE -acetaminophen  (PERCOCET) 7.5-325 MG tablet Take 1 tablet by mouth 2 (two) times daily as needed for severe pain (pain score 7-10). 60 tablet 0   [START ON 07/13/2024] oxyCODONE -acetaminophen  (PERCOCET) 7.5-325 MG tablet Take 1 tablet by mouth 2 (two) times daily as needed for severe pain (pain score 7-10). 60 tablet 0   OXYGEN  Inhale 4 L into the lungs. PT USES [facility] FOR OXYGEN      pantoprazole  (PROTONIX ) 40 MG tablet Take 1 tablet (40 mg total) by mouth daily. 90 tablet 3   potassium chloride  (KLOR-CON ) 10 MEQ tablet TAKE 1 TABLET BY MOUTH EVERY OTHER DAY 45 tablet 0   pregabalin  (LYRICA ) 75 MG capsule Take 1 capsule (75 mg total) by mouth 3 (three) times daily. 90 capsule 0   vitamin B-12 (CYANOCOBALAMIN ) 100 MCG tablet Take 100 mcg by mouth daily.     VITAMIN D , CHOLECALCIFEROL , PO Take 1 tablet by mouth in the morning.     No current facility-administered medications for this visit.    Review of Systems  Constitutional:  Negative for  appetite change, chills, fatigue and fever.  HENT:   Negative for hearing loss and voice change.   Eyes:  Negative for eye problems.  Respiratory:  Positive for shortness of breath. Negative for chest tightness and cough.   Cardiovascular:  Negative for chest pain.  Gastrointestinal:  Negative for abdominal  distention, abdominal pain and blood in stool.  Endocrine: Negative for hot flashes.  Genitourinary:  Negative for difficulty urinating and frequency.   Musculoskeletal:  Negative for arthralgias.  Skin:  Negative for itching and rash.  Neurological:  Negative for extremity weakness.  Hematological:  Negative for adenopathy.  Psychiatric/Behavioral:  Negative for confusion.       Performance status (ECOG): 1  Vitals:  Blood pressure (!) 137/90, pulse (!) 102, temperature 98.4 F (36.9 C), temperature source Tympanic, resp. rate 18, weight 118 lb 11.2 oz (53.8 kg), SpO2 93%, peak flow (!) 5 L/min. Physical Exam Constitutional:      General: She is not in acute distress.    Appearance: She is not diaphoretic.  HENT:     Head: Normocephalic and atraumatic.  Eyes:     General: No scleral icterus. Cardiovascular:     Rate and Rhythm: Normal rate.  Pulmonary:     Effort: Pulmonary effort is normal. No respiratory distress.     Comments: Decreased breath sound bilaterally. Breathing is comfortable on nasal cannula oxygen  Abdominal:     General: There is no distension.  Musculoskeletal:        General: Normal range of motion.     Cervical back: Normal range of motion and neck supple.  Skin:    Findings: No erythema.  Neurological:     Mental Status: She is alert and oriented to person, place, and time. Mental status is at baseline.     Cranial Nerves: No cranial nerve deficit.     Motor: No abnormal muscle tone.  Psychiatric:        Mood and Affect: Mood and affect normal.      RADIOGRAPHIC STUDIES: I have personally reviewed the radiological images as listed and agreed with the findings in the report. VAS US  ABI WITH/WO TBI Result Date: 06/01/2024  LOWER EXTREMITY DOPPLER STUDY Patient Name:  Angela Jensen  Date of Exam:   05/31/2024 Medical Rec #: [id number]     Accession #:    [id number] Date of Birth: 1956/01/20     Patient Gender: F Patient Age:   69 years Exam Location:   [facility] Procedure:      VAS US  ABI WITH/WO TBI Referring Phys: Selinda Gu --------------------------------------------------------------------------------  Indications: Claudication, and peripheral artery disease. Other Factors: Left SFA stents are known to be occluded.                Patient complains of constant numbness in left foot.                 07/29/2023: Aortogram and selective Left Lower                ExtremityAngiogram. Mechanical thrombectomy of the Left SFA and                Popliteal Artery with the Rota Rex device. Stent placement to the                Proximal Left SFA with 6 mm diameter by 5 cm length Viabahn                stent.  Stent placement to the Left Popliteal Artery above the                knee with 6 mm diameter by 5 cm length Viabahn stent.                 12/20/2023: Aortogram and Selective Right Lower                ExtremityAngiogram. PTA of the Right SFA with 4 mm diameter by 22                cm length Lutonix drug coated angioplasty balloon. Stent                placement to the Right SFA with 6 mm by 20 cm length Life stent.  Vascular Interventions: 06/01/16: Left distal SFA/popliteal arteries PTAs with                         SFA stent x2;                         06/08/16: Left SFA PTA;                         09/29/18: Left SFA stent x2;                          01/17/2024: Aortogram and Selective Left Lower Extremity                         Angiogram.                          01/21/2024: Left CFA,PFA and SFA Endartectomies and                         patch angioplasty. Stent placement to the Left EIA with                         8 mm diameter by 7.5 cm length Viabahn stent. Mechanical                         thrombectomy to the Left SFA and Popliteal Artery with                         the 8 French Rota Rex device. Stent placement to                         proximal Left SFA with 7 mm diameter by 10 cm length                         Viabahn stent. Stent  placement to the Left most distal                         SFA and Popliteal Artery with 6 mm diameter by 15 cm                         length Viabahn stent. Angioplasty of the Left  Tibioperoneal trunk and PTA with 2.5 mm diameter                         angioplasty balloon proximally and then the entirety of                         the Left Posterior Tibial Artery with 2 mm diameter                         diameter angioplasty. Mechanical Thrombectomy of the                         Left Tibioperoneal trunk and Posterior Tibial Arteries                         withe the penumbra 6 bolt device. Instillation of 4 mg                         of TPA in the Left Posteriot tibial Artery distally for                         residual thrombus after above procedures. Comparison Study: 02/21/2024 Performing Technologist: Leafy Gibes RVS  Examination Guidelines: A complete evaluation includes at minimum, Doppler waveform signals and systolic blood pressure reading at the level of bilateral brachial, anterior tibial, and posterior tibial arteries, when vessel segments are accessible. Bilateral testing is considered an integral part of a complete examination. Photoelectric Plethysmograph (PPG) waveforms and toe systolic pressure readings are included as required and additional duplex testing as needed. Limited examinations for reoccurring indications may be performed as noted.  ABI Findings: +---------+------------------+-----+---------+--------+ Right    Rt Pressure (mmHg)IndexWaveform Comment  +---------+------------------+-----+---------+--------+ Brachial 131                                      +---------+------------------+-----+---------+--------+ ATA      94                0.72 biphasic          +---------+------------------+-----+---------+--------+ PTA      118               0.90 triphasic         +---------+------------------+-----+---------+--------+ Great Toe122                0.93 Normal            +---------+------------------+-----+---------+--------+ +---------+------------------+-----+---------+-------+ Left     Lt Pressure (mmHg)IndexWaveform Comment +---------+------------------+-----+---------+-------+ Brachial 124                                     +---------+------------------+-----+---------+-------+ ATA      113               0.86 biphasic         +---------+------------------+-----+---------+-------+ PTA      0                 0.00 absent           +---------+------------------+-----+---------+-------+ PERO     135  1.03 triphasic        +---------+------------------+-----+---------+-------+ Great Toe146               1.11 Normal           +---------+------------------+-----+---------+-------+ +-------+-----------+-----------+------------+------------+ ABI/TBIToday's ABIToday's TBIPrevious ABIPrevious TBI +-------+-----------+-----------+------------+------------+ Right  .90        .93        .92         .99          +-------+-----------+-----------+------------+------------+ Left   1.03       1.11       .92         .90          +-------+-----------+-----------+------------+------------+ Left ABIs and TBIs appear increased compared to prior study on 02/21/2024. Right ABIs appear increased compared to prior study on 02/21/2024. Rt TBIs appear to be decreased compared to preior study on 02/21/2024.  Summary: Right: Resting right ankle-brachial index indicates mild right lower extremity arterial disease. The right toe-brachial index is normal.  Left: Resting left ankle-brachial index is within normal range. The left toe-brachial index is normal.  *See table(s) above for measurements and observations.  Electronically signed by Selinda Gu MD on 06/01/2024 at 11:52:48 AM.    Final    VAS US  LOWER EXTREMITY ARTERIAL DUPLEX Result Date: 06/01/2024 LOWER EXTREMITY ARTERIAL DUPLEX STUDY Patient Name:   Angela Jensen  Date of Exam:   05/31/2024 Medical Rec #: [id number]     Accession #:    [id number] Date of Birth: 1955/10/13     Patient Gender: F Patient Age:   39 years Exam Location:  [facility] Procedure:      VAS US  LOWER EXTREMITY ARTERIAL DUPLEX Referring Phys: SELINDA GU --------------------------------------------------------------------------------  Indications: Claudication, and peripheral artery disease. Other Factors: Left SFA stents are known to be occluded.                Patient complains of constant numbness in left foot.                 07/29/2023: Aortogram and selective Left Lower                ExtremityAngiogram. Mechanical thrombectomy of the Left SFA and                Popliteal Artery with the Rota Rex device. Stent placement to the                Proximal Left SFA with 6 mm diameter by 5 cm length Viabahn                stent. Stent placement to the Left Popliteal Artery above the                knee with 6 mm diameter by 5 cm length Viabahn stent.                 12/20/2023: Aortogram and Selective Right Lower                ExtremityAngiogram. PTA of the Right SFA with 4 mm diameter by 22                cm length Lutonix drug coated angioplasty balloon. Stent                placement to the Right SFA with 6 mm by 20 cm length Life stent.  Vascular Interventions: 06/01/16: Left distal SFA/popliteal arteries PTAs  with                         SFA stent x2;                         06/08/16: Left SFA PTA;                         09/29/18: Left SFA stent x2;                          01/17/2024: Aortogram and Selective Left Lower Extremity                         Angiogram.                          01/21/2024: Left CFA,PFA and SFA Endartectomies and                         patch angioplasty. Stent placement to the Left EIA with                         8 mm diameter by 7.5 cm length Viabahn stent. Mechanical                         thrombectomy to the Left SFA and Popliteal Artery with                          the 8 French Rota Rex device. Stent placement to                         proximal Left SFA with 7 mm diameter by 10 cm length                         Viabahn stent. Stent placement to the Left most distal                         SFA and Popliteal Artery with 6 mm diameter by 15 cm                         length Viabahn stent. Angioplasty of the Left                         Tibioperoneal trunk and PTA with 2.5 mm diameter                         angioplasty balloon proximally and then the entirety of                         the Left Posterior Tibial Artery with 2 mm diameter                         diameter angioplasty. Mechanical Thrombectomy of the                         Left Tibioperoneal trunk and Posterior Tibial Arteries  withe the penumbra 6 bolt device. Instillation of 4 mg                         of TPA in the Left Posteriot tibial Artery distally for                         residual thrombus after above procedures. Current ABI:            Rt .90, Lt 1.03 Comparison Study: 12/01/2023 Performing Technologist: Leafy Gibes RVS  Examination Guidelines: A complete evaluation includes B-mode imaging, spectral Doppler, color Doppler, and power Doppler as needed of all accessible portions of each vessel. Bilateral testing is considered an integral part of a complete examination. Limited examinations for reoccurring indications may be performed as noted.   +-----------+--------+-----+--------+----------+--------+ LEFT       PSV cm/sRatioStenosisWaveform  Comments +-----------+--------+-----+--------+----------+--------+ CFA Distal 52                   biphasic           +-----------+--------+-----+--------+----------+--------+ DFA        33                   biphasic           +-----------+--------+-----+--------+----------+--------+ SFA Prox   24                   biphasic           +-----------+--------+-----+--------+----------+--------+  SFA Mid    25                   biphasic           +-----------+--------+-----+--------+----------+--------+ SFA Distal 25                   biphasic           +-----------+--------+-----+--------+----------+--------+ POP Distal 25                   biphasic           +-----------+--------+-----+--------+----------+--------+ ATA Distal 12                   monophasic         +-----------+--------+-----+--------+----------+--------+ PTA Distal 0                    Absent             +-----------+--------+-----+--------+----------+--------+ PERO Distal65                   biphasic           +-----------+--------+-----+--------+----------+--------+  Summary: Left: Imaging and Waveforms onbtained throughout in the Left Lower Extremity. Biphasic Waveforms seen throughout with the exception of the Left PTA which is Absent.  See table(s) above for measurements and observations. Electronically signed by Selinda Gu MD on 06/01/2024 at 11:51:49 AM.    Final    DG Chest Port 1 View Result Date: 04/21/2024 CLINICAL DATA:  Shortness of breath. Infiltrate of lung present on imaging study. EXAM: PORTABLE CHEST 1 VIEW COMPARISON:  Chest radiograph 04/18/2024, 04/09/2024 FINDINGS: Continued improvement in right lung base opacity. Mild residual ill-defined and bandlike opacity persists. There may be minimal pleural fluid/scarring. Stable heart size and mediastinal contours. Lead less pacemaker in place. Again seen emphysema. Right upper lobe scarring. No new consolidation. No pneumothorax. IMPRESSION: 1. Continued  improvement in right lung base opacity. Mild residual ill-defined and bandlike opacity persists. 2. Emphysema. Electronically Signed   By: Andrea Gasman M.D.   On: 04/21/2024 16:54   DG Chest Port 1 View Result Date: 04/18/2024 CLINICAL DATA:  COPD exacerbation. EXAM: PORTABLE CHEST 1 VIEW COMPARISON:  04/09/2024 FINDINGS: Cardiopericardial silhouette is at upper limits of  normal for size. Lungs are hyperexpanded. Scattered areas of architectural distortion/scarring are noted bilaterally. Pleuroparenchymal opacity in the right base is mildly improved in the interval. No acute bony abnormality. Telemetry leads overlie the chest. IMPRESSION: Mild interval improvement in right base pleuroparenchymal opacity likely a combination of airspace disease and pleural fluid. Electronically Signed   By: Camellia Candle M.D.   On: 04/18/2024 06:43   DG Chest Portable 1 View Result Date: 04/09/2024 CLINICAL DATA:  Shortness of breath.  COPD.  Hypoxia. EXAM: PORTABLE CHEST 1 VIEW COMPARISON:  03/31/2024 FINDINGS: Stable mild cardiomegaly. Pulmonary hyperinflation is seen, consistent with COPD. Airspace opacity in the right lower lobe and small right pleural effusion show no significant change. No new or worsening areas of opacity are seen. IMPRESSION: No significant change in right lower lobe airspace opacity and small right pleural effusion, suspicious for pneumonia. COPD. Electronically Signed   By: Norleen DELENA Kil M.D.   On: 04/09/2024 09:57   DG Chest 1 View Result Date: 03/31/2024 CLINICAL DATA:  Shortness of breath EXAM: CHEST  1 VIEW COMPARISON:  03/19/2024 FINDINGS: Heart and mediastinal contours within normal limits. Consolidation noted in the right lower lobe and to a lesser extent left lower lobe compatible with pneumonia. Findings similar to prior study. No significant effusion or pneumothorax. No acute bony abnormality. IMPRESSION: Bilateral lower lobe airspace opacities, right greater than left compatible with pneumonia. No real change since prior study. Electronically Signed   By: Franky Crease M.D.   On: 03/31/2024 23:24

## 2024-06-27 NOTE — Assessment & Plan Note (Signed)
 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  Lab Results  Component Value Date   MPROTEIN 0.4 (H) 10/08/2023   KPAFRELGTCHN 17.9 10/08/2023   LAMBDASER 18.4 10/08/2023   KAPLAMBRATIO 0.97 10/08/2023  M protein and light chain ratio are pending.  I recommend observation. Check SPEP and light chain ratio every 6 months.

## 2024-06-27 NOTE — Assessment & Plan Note (Addendum)
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 Related to prior radiation   Oct 2025 [facility] CTA showed consolidation within the right lower lobe, likely related to progression of radiation fibrosis.  Given her extensive history of smoking and history of lung cancer, I recommend to continue annual CT without contrast for screening

## 2024-06-27 NOTE — Assessment & Plan Note (Signed)
 Lab Results  Component Value Date   HGB 13.0 06/27/2024   TIBC 456 (H) 06/27/2024   IRONPCTSAT 24 06/27/2024   FERRITIN 145 06/27/2024    Both hemoglobin and iron  panel have normalized.  Hold off IV Venofer 

## 2024-06-28 ENCOUNTER — Ambulatory Visit: Admitting: Nurse Practitioner

## 2024-06-28 ENCOUNTER — Encounter

## 2024-06-28 LAB — KAPPA/LAMBDA LIGHT CHAINS
Kappa free light chain: 16.4 mg/L (ref 3.3–19.4)
Kappa, lambda light chain ratio: 0.93 (ref 0.26–1.65)
Lambda free light chains: 17.7 mg/L (ref 5.7–26.3)

## 2024-06-30 LAB — MULTIPLE MYELOMA PANEL, SERUM
Albumin SerPl Elph-Mcnc: 4.3 g/dL (ref 2.9–4.4)
Albumin/Glob SerPl: 1.2 (ref 0.7–1.7)
Alpha 1: 0.3 g/dL (ref 0.0–0.4)
Alpha2 Glob SerPl Elph-Mcnc: 1.2 g/dL — ABNORMAL HIGH (ref 0.4–1.0)
B-Globulin SerPl Elph-Mcnc: 1.3 g/dL (ref 0.7–1.3)
Gamma Glob SerPl Elph-Mcnc: 1.1 g/dL (ref 0.4–1.8)
Globulin, Total: 3.9 g/dL (ref 2.2–3.9)
IgA: 188 mg/dL (ref 87–352)
IgG (Immunoglobin G), Serum: 1103 mg/dL (ref 586–1602)
IgM (Immunoglobulin M), Srm: 141 mg/dL (ref 26–217)
M Protein SerPl Elph-Mcnc: 0.3 g/dL — ABNORMAL HIGH
Total Protein ELP: 8.2 g/dL (ref 6.0–8.5)

## 2024-07-03 ENCOUNTER — Encounter

## 2024-07-05 ENCOUNTER — Encounter: Admitting: Emergency Medicine

## 2024-07-05 DIAGNOSIS — J449 Chronic obstructive pulmonary disease, unspecified: Secondary | ICD-10-CM | POA: Diagnosis not present

## 2024-07-05 NOTE — Progress Notes (Signed)
 Daily Session Note  Patient Details  Name: EARNESTINE SHIPP MRN: [id number] Date of Birth: 1956-07-20 Referring Provider:   [facility] from 04/13/2024 in [facility]  Referring Provider Parris Manna, MD    Encounter Date: 07/05/2024  Check In:  Session Check In - 07/05/24 1409       Check-In   Supervising physician immediately available to respond to emergencies See telemetry face sheet for immediately available ER MD    Location [facility]-Card[facility] & Pul[facility]    Staff Present Leita Franks RN,BSN;Joseph Rolinda RCP,RRT,BSRT;Kelly Bollinger RN,BSN,MPA;Kelly Dyane BS, ACSM CEP, Exercise Physiologist    Virtual Visit No    Medication changes reported     No    Fall or balance concerns reported    No    Warm-up and Cool-down Performed on first and last piece of equipment    Resistance Training Performed Yes    VAD Patient? No    PAD/SET Patient? No      Pain Assessment   Currently in Pain? No/denies             Social History   Tobacco Use  Smoking Status Former   Current packs/day: 0.00   Average packs/day: 1 pack/day for 37.0 years (37.0 ttl pk-yrs)   Types: Cigarettes   Start date: 02/06/1973   Quit date: 02/06/2010   Years since quitting: 14.4  Smokeless Tobacco Former   Types: Snuff    Goals Met:  Proper associated with RPD/PD & O2 Sat Independence with exercise equipment Using PLB without cueing & demonstrates good technique Exercise tolerated well No report of concerns or symptoms today Strength training completed today  Goals Unmet:  Not Applicable  Comments: Pt able to follow exercise prescription today without complaint.  Will continue to monitor for progression.    Dr. Oneil Pinal is Medical Director for [facility] Card[facility] Rehabilitation.  Dr. Fuad Aleskerov is Medical Director for [facility] Pul[facility] Rehabilitation.

## 2024-07-05 NOTE — Progress Notes (Signed)
 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 classification (HCC)  Patient's Home Medications on Admission:  Current Outpatient Medications:    ACCU-CHEK GUIDE TEST test strip, USE TO CHECK BLOOD SUGAR 5 TIMES DAILY, Disp: 450 each, Rfl: 0   Accu-Chek Softclix Lancets lancets, Use 1 lancet 3 times daily to check glucose for diabetes, Disp: 300 each, Rfl: 1   albuterol  (PROVENTIL ) (5 MG/ML) 0.5% nebulizer solution, Take 2.5 mg by nebulization every 6 (six) hours as needed for wheezing or shortness of breath., Disp: , Rfl:    albuterol  (VENTOLIN  HFA) 108 (90 Base) MCG/ACT inhaler, INHALE 2 PUFFS BY MOUTH EVERY 6 HOURS AS NEEDED FOR WHEEZING OR SHORTNESS OF BREATH, Disp: 3 each, Rfl: 5   ALPRAZolam  (XANAX ) 1 MG tablet, Take 1 tablet (1 mg total) by mouth 2 (two) times daily as needed for anxiety or sleep., Disp: 60 tablet, Rfl: 1   aspirin  EC 81 MG tablet, Take 1 tablet (81 mg total) by mouth daily. Swallow whole., Disp: 150 tablet, Rfl: 2   atorvastatin  (LIPITOR) 10 MG tablet, Take 1 tablet (10 mg total) by mouth daily., Disp: 30 tablet, Rfl: 11   Blood Glucose Monitoring Suppl (ACCU-CHEK GUIDE) w/Device KIT, Use as directed Dx e11.65, Disp: 1 kit, Rfl: 0   budesonide -glycopyrrolate -formoterol  (BREZTRI ) 160-9-4.8 MCG/ACT AERO inhaler, Inhale 2 puffs into the lungs 2 (two) times daily., Disp: 10.7 g, Rfl: 11   calcium  carbonate (OSCAL) 1500 (600 Ca) MG TABS tablet, Take 600 mg of elemental calcium  by mouth 2 (two) times daily with a meal., Disp: , Rfl:    clopidogrel  (PLAVIX ) 75 MG tablet, Take 1 tablet (75  mg total) by mouth daily., Disp: 90 tablet, Rfl: 3   cyclobenzaprine  (FLEXERIL ) 10 MG tablet, Take 1 tablet (10 mg total) by mouth at bedtime. Take one tab po qhs for back spasm prn only, Disp: 30 tablet, Rfl: 3   escitalopram  (LEXAPRO ) 10 MG tablet, Take 1 tablet by mouth once daily, Disp: 90 tablet, Rfl: 0   ferrous sulfate  325 (65 FE) MG tablet, Take 1 tablet (325 mg total) by mouth daily with breakfast., Disp: , Rfl:    furosemide  (LASIX ) 20 MG tablet, Take 1 tablet (20 mg total) by mouth daily as needed., Disp: 30 tablet, Rfl: 5   insulin  glargine, 1 Unit Dial , (TOUJEO ) 300 UNIT/ML Solostar Pen, Inject 10 Units into the skin daily., Disp: 1.5 mL, Rfl: 11   Insulin  Pen Needle (RELION PEN NEEDLES) 31G X 6 MM MISC, USE 1  pen needle with insulin  pen ONCE DAILY for diabetes, Disp: 50 each, Rfl: 11   ipratropium-albuterol  (DUONEB) 0.5-2.5 (3) MG/3ML SOLN, USE 1 VIAL IN NEBULIZER EVERY 6 HOURS AS NEEDED, Disp: 360 mL, Rfl: 0   losartan  (COZAAR ) 50 MG tablet, Take 1 tablet (50 mg total) by mouth daily., Disp: 90 tablet, Rfl: 1   metFORMIN  (GLUCOPHAGE -XR) 500 MG 24 hr tablet, TAKE 2 TABLETS BY MOUTH ONCE DAILY WITH BREAKFAST, Disp: 180 tablet, Rfl: 0   metoprolol  succinate (TOPROL -XL) 50 MG 24 hr tablet, Take 1 tablet (50 mg total) by mouth daily. Take with or  immediately following a meal. May take 1/2 tablet if BP is less than 120/80., Disp: 90 tablet, Rfl: 3   oxyCODONE -acetaminophen  (PERCOCET) 7.5-325 MG tablet, Take 1 tablet by mouth 2 (two) times daily as needed for severe pain (pain score 7-10)., Disp: 60 tablet, Rfl: 0   [START ON 07/13/2024] oxyCODONE -acetaminophen  (PERCOCET) 7.5-325 MG tablet, Take 1 tablet by mouth 2 (two) times daily as needed for severe pain (pain score 7-10)., Disp: 60 tablet, Rfl: 0   OXYGEN , Inhale 4 L into the lungs. PT USES [facility] FOR OXYGEN , Disp: , Rfl:    pantoprazole  (PROTONIX ) 40 MG tablet, Take 1 tablet (40 mg total) by mouth daily., Disp: 90 tablet, Rfl: 3    potassium chloride  (KLOR-CON ) 10 MEQ tablet, TAKE 1 TABLET BY MOUTH EVERY OTHER DAY, Disp: 45 tablet, Rfl: 0   pregabalin  (LYRICA ) 75 MG capsule, Take 1 capsule (75 mg total) by mouth 3 (three) times daily., Disp: 90 capsule, Rfl: 0   vitamin B-12 (CYANOCOBALAMIN ) 100 MCG tablet, Take 100 mcg by mouth daily., Disp: , Rfl:    VITAMIN D , CHOLECALCIFEROL , PO, Take 1 tablet by mouth in the morning., Disp: , Rfl:   Past Medical History: Past Medical History:  Diagnosis Date   Anemia    Arthritis    Asthma    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   Cancer (HCC) 2012   Right Lung CA   COPD (chronic obstructive pulmonary disease) (HCC)    Depression    Diabetes mellitus without complication (HCC)    Patient takes Janumet   Essential hypertension 05/26/2016   Heart failure (HCC) 2022   Hydropneumothorax 05/03/2020   Hypercholesteremia    Oxygen  dependent    2L at nite    PAD (peripheral artery disease) 06/22/2016   Peripheral vascular disease    Personal history of radiation therapy    Shortness of breath dyspnea    with exertion    Sialolithiasis    Sleep apnea    Wears dentures    full upper and lower    Tobacco Use: Social History   Tobacco Use  Smoking Status Former   Current packs/day: 0.00   Average packs/day: 1 pack/day for 37.0 years (37.0 ttl pk-yrs)   Types: Cigarettes   Start date: 02/06/1973   Quit date: 02/06/2010   Years since quitting: 14.4  Smokeless Tobacco Former   Types: Snuff    Labs: Review Flowsheet  More data exists      Latest Ref Rng & Units 01/12/2024 01/27/2024 04/09/2024 04/18/2024 04/21/2024  Labs for ITP Cardiac and Pulmonary Rehab  Hemoglobin A1c 4.8 - 5.6 % 8.0  - - 7.2  -  Bicarbonate 20.0 - 28.0 mmol/L - 40.2  29.9  38.0  37.2  32.7   O2 Saturation % - 41.6  99.1  88.7  59.6     Details       Multiple values from one day are sorted in reverse-chronological order          Pulmonary  Assessment Scores:  Pulmonary Assessment Scores     Row Name 04/13/24 1624         ADL UCSD   ADL Phase Entry     SOB Score total 71     Rest 0     Walk 3     Stairs 4     Bath 4     Dress 3     Shop 3  CAT Score   CAT Score 26       mMRC Score   mMRC Score 2        UCSD: Self-administered rating of dyspnea associated with activities of daily living (ADLs) 6-point scale (0 = not at all to 5 = maximal or unable to do because of breathlessness)  Scoring Scores range from 0 to 120.  Minimally important difference is 5 units  CAT: CAT can identify the health impairment of COPD patients and is better correlated with disease progression.  CAT has a scoring range of zero to 40. The CAT score is classified into four groups of low (less than 10), medium (10 - 20), high (21-30) and very high (31-40) based on the impact level of disease on health status. A CAT score over 10 suggests significant symptoms.  A worsening CAT score could be explained by an exacerbation, poor medication adherence, poor inhaler technique, or progression of COPD or comorbid conditions.  CAT MCID is 2 points  mMRC: mMRC (Modified Medical Research Council) Dyspnea Scale is used to assess the degree of baseline functional disability in patients of respiratory disease due to dyspnea. No minimal important difference is established. A decrease in score of 1 point or greater is considered a positive change.   Pulmonary Function Assessment:   Exercise Target Goals: Exercise Program Goal: Individual exercise prescription set using results from initial 6 min walk test and THRR while considering  patient's activity barriers and safety.   Exercise Prescription Goal: Initial exercise prescription builds to 30-45 minutes a day of aerobic activity, 2-3 days per week.  Home exercise guidelines will be given to patient during program as part of exercise prescription that the participant will  acknowledge.  Education: Aerobic Exercise: - Group verbal and visual presentation on the components of exercise prescription. Introduces F.I.T.T principle from ACSM for exercise prescriptions.  Reviews F.I.T.T. principles of aerobic exercise including progression. Written material provided at class time.   Education: Resistance Exercise: - Group verbal and visual presentation on the components of exercise prescription. Introduces F.I.T.T principle from ACSM for exercise prescriptions  Reviews F.I.T.T. principles of resistance exercise including progression. Written material provided at class time.    Education: Exercise & Equipment Safety: - Individual verbal instruction and demonstration of equipment use and safety with use of the equipment. Flowsheet Row Pulmonary Rehab from 04/13/2024 in [facility] Cardiac and Pulmonary Rehab  Date 04/13/24  Educator MB  Instruction Review Code 1- Verbalizes Understanding    Education: Exercise Physiology & General Exercise Guidelines: - Group verbal and written instruction with models to review the exercise physiology of the cardiovascular system and associated critical values. Provides general exercise guidelines with specific guidelines to those with heart or lung disease.    Education: Flexibility, Balance, Mind/Body Relaxation: - Group verbal and visual presentation with interactive activity on the components of exercise prescription. Introduces F.I.T.T principle from ACSM for exercise prescriptions. Reviews F.I.T.T. principles of flexibility and balance exercise training including progression. Also discusses the mind body connection.  Reviews various relaxation techniques to help reduce and manage stress (i.e. Deep breathing, progressive muscle relaxation, and visualization). Balance handout provided to take home. Written material provided at class time.   Activity Barriers & Risk Stratification:  Activity Barriers & Cardiac Risk Stratification -  04/13/24 1620       Activity Barriers & Cardiac Risk Stratification   Activity Barriers Joint Problems;Balance Concerns;Other (comment);Assistive Device    Comments recent stent in L leg causing numbness  6 Minute Walk:  6 Minute Walk     Row Name 04/13/24 1615         6 Minute Walk   Phase Initial     Distance 620 feet     Walk Time 6 minutes     # of Rest Breaks 0     MPH 1.17     METS 1.97     RPE 11     Perceived Dyspnea  0     VO2 Peak 6.89     Symptoms No     Resting HR 78 bpm     Resting BP 106/60     Resting Oxygen  Saturation  92 %     Exercise Oxygen  Saturation  during 6 min walk 86 %     Max Ex. HR 96 bpm     Max Ex. BP 148/72     2 Minute Post BP 132/72       Interval HR   1 Minute HR 94     2 Minute HR 94     3 Minute HR 94     4 Minute HR 96     5 Minute HR 90     6 Minute HR 95     2 Minute Post HR 76     Interval Heart Rate? Yes       Interval Oxygen    Interval Oxygen ? Yes     Baseline Oxygen  Saturation % 92 %     1 Minute Oxygen  Saturation % 91 %     1 Minute Liters of Oxygen  4 L     2 Minute Oxygen  Saturation % 87 %     2 Minute Liters of Oxygen  4 L     3 Minute Oxygen  Saturation % 86 %     3 Minute Liters of Oxygen  4 L     4 Minute Oxygen  Saturation % 88 %     4 Minute Liters of Oxygen  4 L     5 Minute Oxygen  Saturation % 87 %     5 Minute Liters of Oxygen  4 L     6 Minute Oxygen  Saturation % 87 %     6 Minute Liters of Oxygen  4 L     2 Minute Post Oxygen  Saturation % 95 %     2 Minute Post Liters of Oxygen  4 L       Oxygen  Initial Assessment:  Oxygen  Initial Assessment - 04/07/24 1308       Home Oxygen    Home Oxygen  Device Home Concentrator    Sleep Oxygen  Prescription Continuous    Liters per minute 3.5    Home Exercise Oxygen  Prescription Continuous    Liters per minute 4    Home Resting Oxygen  Prescription Continuous    Liters per minute 4    Compliance with Home Oxygen  Use Yes      Intervention   Short  Term Goals To learn and exhibit compliance with exercise, home and travel O2 prescription;To learn and understand importance of monitoring SPO2 with pulse oximeter and demonstrate accurate use of the pulse oximeter.;To learn and understand importance of maintaining oxygen  saturations>88%;To learn and demonstrate proper pursed lip breathing techniques or other breathing techniques. ;To learn and demonstrate proper use of respiratory medications    Long  Term Goals Verbalizes importance of monitoring SPO2 with pulse oximeter and return demonstration;Exhibits compliance with exercise, home  and travel O2 prescription;Maintenance of O2 saturations>88%;Exhibits proper breathing techniques, such as pursed lip  breathing or other method taught during program session;Compliance with respiratory medication;Demonstrates proper use of MDI's          Oxygen  Re-Evaluation:  Oxygen  Re-Evaluation     Row Name 05/01/24 1356             Goals/Expected Outcomes   Comments Reviewed PLB technique with pt.  Talked about how it works and it's importance in maintaining their exercise saturations.       Goals/Expected Outcomes Short: Become more profiecient at using PLB.   Long: Become independent at using PLB.          Oxygen  Discharge (Final Oxygen  Re-Evaluation):  Oxygen  Re-Evaluation - 05/01/24 1356       Goals/Expected Outcomes   Comments Reviewed PLB technique with pt.  Talked about how it works and it's importance in maintaining their exercise saturations.    Goals/Expected Outcomes Short: Become more profiecient at using PLB.   Long: Become independent at using PLB.          Initial Exercise Prescription:  Initial Exercise Prescription - 04/13/24 1600       Date of Initial Exercise RX and Referring Provider   Date 04/13/24    Referring Provider Parris Manna, MD      Oxygen    Oxygen  Continuous    Liters 4    Maintain Oxygen  Saturation 88% or higher      Recumbant Bike   Level 1     RPM 50    Watts 15    Minutes 15    METs 1.97      NuStep   Level 1    SPM 80    Minutes 15    METs 1.97      Track   Laps 17    Minutes 15    METs 1.92      Prescription Details   Frequency (times per week) 2    Duration Progress to 30 minutes of continuous aerobic without signs/symptoms of physical distress      Intensity   THRR 40-80% of Max Heartrate 107-137    Ratings of Perceived Exertion 11-13    Perceived Dyspnea 0-4      Progression   Progression Continue to progress workloads to maintain intensity without signs/symptoms of physical distress.      Resistance Training   Training Prescription Yes    Weight 3 lb    Reps 10-15          Perform Capillary Blood Glucose checks as needed.  Exercise Prescription Changes:   Exercise Prescription Changes     Row Name 04/13/24 1600 05/18/24 1300 05/30/24 1400 06/29/24 1100       Response to Exercise   Blood Pressure (Admit) 106/60 118/66 178/86 132/74    Blood Pressure (Exercise) 148/72 150/90 186/78 154/74    Blood Pressure (Exit) 132/72 100/50 146/82 142/72    Heart Rate (Admit) 78 bpm 102 bpm 93 bpm 80 bpm    Heart Rate (Exercise) 96 bpm 111 bpm 113 bpm 105 bpm    Heart Rate (Exit) 76 bpm 103 bpm 86 bpm 109 bpm    Oxygen  Saturation (Admit) 92 % 87 % 93 % 94 %    Oxygen  Saturation (Exercise) 86 % 84 %  came up to 88% with rest and increase to 4 L 84 %  came up to 88% with rest and increase to 4 L 86 %  came up to 88% with rest    Oxygen  Saturation (Exit) 95 %  87 % 92 % 93 %    Rating of Perceived Exertion (Exercise) 11 14 15 15     Perceived Dyspnea (Exercise) 0 1 0 1    Symptoms none none none none    Comments results 1st week of exercise sessions -- --    Duration -- Progress to 30 minutes of  aerobic without signs/symptoms of physical distress Continue with 30 min of aerobic exercise without signs/symptoms of physical distress. Continue with 30 min of aerobic exercise without signs/symptoms of  physical distress.    Intensity -- THRR unchanged THRR unchanged THRR unchanged      Progression   Progression -- Continue to progress workloads to maintain intensity without signs/symptoms of physical distress. Continue to progress workloads to maintain intensity without signs/symptoms of physical distress. Continue to progress workloads to maintain intensity without signs/symptoms of physical distress.    Average METs 1.97 1.58 1.88 1.96      Resistance Training   Training Prescription -- Yes Yes Yes    Weight -- 3 lb 3 lb 3 lb    Reps -- 10-15 10-15 10-15      Interval Training   Interval Training -- No No No      Oxygen    Oxygen  -- Continuous Continuous Continuous    Liters -- 3-4 3-4 6 L      Treadmill   MPH -- -- 0.7 --    Grade -- -- 0 --    Minutes -- -- 15 --    METs -- -- 1.54 --      Recumbant Bike   Level -- 1 1 2     Watts -- 15 15 11     Minutes -- 15 15 15     METs -- 2.85 2.89 2.64      NuStep   Level -- 2 1 1     Minutes -- 15 15 30     METs -- 1.5 1.6 2.1      Track   Laps -- 10 -- --    Minutes -- 15 -- --    METs -- 1.54 -- --      Oxygen    Maintain Oxygen  Saturation -- 88% or higher 88% or higher 88% or higher       Exercise Comments:   Exercise Comments     Row Name 05/01/24 1352           Exercise Comments First full day of exercise!  Patient was oriented to gym and equipment including functions, settings, policies, and procedures.  Patient's individual exercise prescription and treatment plan were reviewed.  All starting workloads were established based on the results of the 6 minute walk test done at initial orientation visit.  The plan for exercise progression was also introduced and progression will be customized based on patient's performance and goals.          Exercise Goals and Review:   Exercise Goals     Row Name 04/13/24 1623             Exercise Goals   Increase Physical Activity Yes       Intervention Provide  advice, education, support and counseling about physical activity/exercise needs.;Develop an individualized exercise prescription for aerobic and resistive training based on initial evaluation findings, risk stratification, comorbidities and participant's personal goals.       Expected Outcomes Short Term: Attend rehab on a regular basis to increase amount of physical activity.;Long Term: Add in home exercise to make exercise part of  routine and to increase amount of physical activity.;Long Term: Exercising regularly at least 3-5 days a week.       Increase Strength and Stamina Yes       Intervention Provide advice, education, support and counseling about physical activity/exercise needs.;Develop an individualized exercise prescription for aerobic and resistive training based on initial evaluation findings, risk stratification, comorbidities and participant's personal goals.       Expected Outcomes Short Term: Increase workloads from initial exercise prescription for resistance, speed, and METs.;Short Term: Perform resistance training exercises routinely during rehab and add in resistance training at home;Long Term: Improve cardiorespiratory fitness, muscular endurance and strength as measured by increased METs and functional capacity ( )       Able to understand and use rate of perceived exertion (RPE) scale Yes       Intervention Provide education and explanation on how to use RPE scale       Expected Outcomes Short Term: Able to use RPE daily in rehab to express subjective intensity level;Long Term:  Able to use RPE to guide intensity level when exercising independently       Able to understand and use Dyspnea scale Yes       Intervention Provide education and explanation on how to use Dyspnea scale       Expected Outcomes Long Term: Able to use Dyspnea scale to guide intensity level when exercising independently;Short Term: Able to use Dyspnea scale daily in rehab to express subjective sense of  shortness of breath during exertion       Knowledge and understanding of Target Heart Rate Range (THRR) Yes       Intervention Provide education and explanation of THRR including how the numbers were predicted and where they are located for reference       Expected Outcomes Short Term: Able to state/look up THRR;Short Term: Able to use daily as guideline for intensity in rehab;Long Term: Able to use THRR to govern intensity when exercising independently       Able to check pulse independently Yes       Intervention Provide education and demonstration on how to check pulse in carotid and radial arteries.;Review the importance of being able to check your own pulse for safety during independent exercise       Expected Outcomes Short Term: Able to explain why pulse checking is important during independent exercise;Long Term: Able to check pulse independently and accurately       Understanding of Exercise Prescription Yes       Intervention Provide education, explanation, and written materials on patient's individual exercise prescription       Expected Outcomes Short Term: Able to explain program exercise prescription;Long Term: Able to explain home exercise prescription to exercise independently          Exercise Goals Re-Evaluation :  Exercise Goals Re-Evaluation     Row Name 05/01/24 1353 05/18/24 1347 05/30/24 1410 06/15/24 1136 06/29/24 1110     Exercise Goal Re-Evaluation   Exercise Goals Review Increase Physical Activity;Able to understand and use rate of perceived exertion (RPE) scale;Knowledge and understanding of Target Heart Rate Range (THRR);Understanding of Exercise Prescription;Increase Strength and Stamina;Able to understand and use Dyspnea scale;Able to check pulse independently Increase Physical Activity;Understanding of Exercise Prescription;Increase Strength and Stamina Increase Physical Activity;Understanding of Exercise Prescription;Increase Strength and Stamina Increase Physical  Activity;Understanding of Exercise Prescription;Increase Strength and Stamina Increase Physical Activity;Understanding of Exercise Prescription;Increase Strength and Stamina   Comments Reviewed RPE and dyspnea scale,  THR and program prescription with pt today.  Pt voiced understanding and was given a copy of goals to take home. Emilyn is off to a good start in the program. She completed her first 2 weeks in this review. She was able to work at level 2 on the T4 nustep, level 1 on the recumbent bike, and walk 10 laps on the track. We will continue to monitor her progress in the program. Prim has only attended two sessions since the last review. She continues to do well at level 1 on both the recumbent bike and T4 nustep. She also began using the treadmill at a speed of 0.7 mph with no incline. We will continue to monitor her progress in the program. Rukiya did not attend rehab since the last review. She has since returned to rehab for one session. We will continue to monitor her progress in the program. Priti is doing well in rehab. She increased to level 2 on the recumbent bike. She maintained level 1 on the T4 nustep for 30 minutes. We will continue to monitor her progress in the program.   Expected Outcomes Short: Use RPE daily to regulate intensity.  Long: Follow program prescription in THR. Short: Continue to follow current exercise prescription. Long: Continue exercise to improve strength and stamina. Short: Attend rehab more consistently. Long: Continue exercise to improve strength and stamina. Short: Attend rehab more consistently. Long: Continue exercise to improve strength and stamina. Short: Try level 2 on the T4 nustep. Long: Continue exercise to improve strength and stamina.      Discharge Exercise Prescription (Final Exercise Prescription Changes):  Exercise Prescription Changes - 06/29/24 1100       Response to Exercise   Blood Pressure (Admit) 132/74    Blood Pressure (Exercise) 154/74     Blood Pressure (Exit) 142/72    Heart Rate (Admit) 80 bpm    Heart Rate (Exercise) 105 bpm    Heart Rate (Exit) 109 bpm    Oxygen  Saturation (Admit) 94 %    Oxygen  Saturation (Exercise) 86 %   came up to 88% with rest   Oxygen  Saturation (Exit) 93 %    Rating of Perceived Exertion (Exercise) 15    Perceived Dyspnea (Exercise) 1    Symptoms none    Duration Continue with 30 min of aerobic exercise without signs/symptoms of physical distress.    Intensity THRR unchanged      Progression   Progression Continue to progress workloads to maintain intensity without signs/symptoms of physical distress.    Average METs 1.96      Resistance Training   Training Prescription Yes    Weight 3 lb    Reps 10-15      Interval Training   Interval Training No      Oxygen    Oxygen  Continuous    Liters 6 L      Recumbant Bike   Level 2    Watts 11    Minutes 15    METs 2.64      NuStep   Level 1    Minutes 30    METs 2.1      Oxygen    Maintain Oxygen  Saturation 88% or higher          Nutrition:  Target Goals: Understanding of nutrition guidelines, daily intake of sodium 1500mg , cholesterol 200mg , calories 30% from fat and 7% or less from saturated fats, daily to have 5 or more servings of fruits and vegetables.  Education: Nutrition 1 -  Group instruction provided by verbal, written material, interactive activities, discussions, models, and posters to present general guidelines for heart healthy nutrition including macronutrients, label reading, and promoting whole foods over processed counterparts. Education serves as pensions consultant of discussion of heart healthy eating for all. Written material provided at class time.     Education: Nutrition 2 -Group instruction provided by verbal, written material, interactive activities, discussions, models, and posters to present general guidelines for heart healthy nutrition including sodium, cholesterol, and saturated fat. Providing guidance of  habit forming to improve blood pressure, cholesterol, and body weight. Written material provided at class time.     Biometrics:  Pre Biometrics - 04/13/24 1623       Pre Biometrics   Height 4' 11.72 (1.517 m)    Weight 116 lb 3.2 oz (52.7 kg)    Waist Circumference 35 inches    Hip Circumference 37 inches    Waist to Hip Ratio 0.95 %    BMI (Calculated) 22.9    Single Leg Stand 21 seconds           Nutrition Therapy Plan and Nutrition Goals:  Nutrition Therapy & Goals - 04/13/24 1625       Nutrition Therapy   RD appointment deferred Yes      Personal Nutrition Goals   Nutrition Goal RD appointment deferred at this time      Intervention Plan   Intervention Prescribe, educate and counsel regarding individualized specific dietary modifications aiming towards targeted core components such as weight, hypertension, lipid management, diabetes, heart failure and other comorbidities.    Expected Outcomes Short Term Goal: Understand basic principles of dietary content, such as calories, fat, sodium, cholesterol and nutrients.          Nutrition Assessments:  MEDIFICTS Score Key: >=70 Need to make dietary changes  40-70 Heart Healthy Diet <= 40 Therapeutic Level Cholesterol Diet  Flowsheet Row Pulmonary Rehab from 04/13/2024 in [facility] Cardiac and Pulmonary Rehab  Picture Your Plate Total Score on Admission 61   Picture Your Plate Scores: <59 Unhealthy dietary pattern with much room for improvement. 41-50 Dietary pattern unlikely to meet recommendations for good health and room for improvement. 51-60 More healthful dietary pattern, with some room for improvement.  >60 Healthy dietary pattern, although there may be some specific behaviors that could be improved.   Nutrition Goals Re-Evaluation:   Nutrition Goals Discharge (Final Nutrition Goals Re-Evaluation):   Psychosocial: Target Goals: Acknowledge presence or absence of significant depression and/or stress,  maximize coping skills, provide positive support system. Participant is able to verbalize types and ability to use techniques and skills needed for reducing stress and depression.   Education: Stress, Anxiety, and Depression - Group verbal and visual presentation to define topics covered.  Reviews how body is impacted by stress, anxiety, and depression.  Also discusses healthy ways to reduce stress and to treat/manage anxiety and depression.  Written material provided at class time.   Education: Sleep Hygiene -Provides group verbal and written instruction about how sleep can affect your health.  Define sleep hygiene, discuss sleep cycles and impact of sleep habits. Review good sleep hygiene tips.    Initial Review & Psychosocial Screening:  Initial Psych Review & Screening - 04/07/24 1310       Initial Review   Current issues with Current Stress Concerns;Current Anxiety/Panic    Source of Stress Concerns Chronic Illness      Family Dynamics   Good Support System? Yes  Barriers   Psychosocial barriers to participate in program There are no identifiable barriers or psychosocial needs.      Screening Interventions   Interventions Encouraged to exercise;Provide feedback about the scores to participant    Expected Outcomes Short Term goal: Utilizing psychosocial counselor, staff and physician to assist with identification of specific Stressors or current issues interfering with healing process. Setting desired goal for each stressor or current issue identified.;Long Term Goal: Stressors or current issues are controlled or eliminated.;Short Term goal: Identification and review with participant of any Quality of Life or Depression concerns found by scoring the questionnaire.;Long Term goal: The participant improves quality of Life and PHQ9 Scores as seen by post scores and/or verbalization of changes          Quality of Life Scores:  Scores of 19 and below usually indicate a poorer  quality of life in these areas.  A difference of  2-3 points is a clinically meaningful difference.  A difference of 2-3 points in the total score of the Quality of Life Index has been associated with significant improvement in overall quality of life, self-image, physical symptoms, and general health in studies assessing change in quality of life.  PHQ-9: Review Flowsheet  More data exists      04/13/2024 03/28/2024 10/13/2023 08/12/2023 10/08/2022  Depression screen PHQ 2/9  Decreased Interest 0 0 0 0 0  Down, Depressed, Hopeless 1 0 0 0 0  PHQ - 2 Score 1 0 0 0 0  Altered sleeping 3 - - - -  Tired, decreased energy 2 - - - -  Change in appetite 3 - - - -  Feeling bad or failure about yourself  0 - - - -  Trouble concentrating 0 - - - -  Moving slowly or fidgety/restless 0 - - - -  Suicidal thoughts 0 - - - -  PHQ-9 Score 9  - - - -  Difficult doing work/chores Very difficult - - - -    Details       Data saved with a previous flowsheet row definition        Interpretation of Total Score  Total Score Depression Severity:  1-4 = Minimal depression, 5-9 = Mild depression, 10-14 = Moderate depression, 15-19 = Moderately severe depression, 20-27 = Severe depression   Psychosocial Evaluation and Intervention:  Psychosocial Evaluation - 04/07/24 1320       Psychosocial Evaluation & Interventions   Interventions Encouraged to exercise with the program and follow exercise prescription;Stress management education;Relaxation education    Comments Ms. Kaitlyne is coming to pulmonary rehab with COPD. She states her breathing difficulty depends on the day, but she uses her oxygen  consistently. She mentions her anxiety kicks in when she gets real short of breath and xanax  helps with that. Her stress is related to her disease process. She states she has a good support system and is looking forward to the program    Expected Outcomes Short: attend pulmonary rehab for education and exercise Long:  develop and maintain positive self care habits    Continue Psychosocial Services  Follow up required by staff          Psychosocial Re-Evaluation:   Psychosocial Discharge (Final Psychosocial Re-Evaluation):   Education: Education Goals: Education classes will be provided on a weekly basis, covering required topics. Participant will state understanding/return demonstration of topics presented.  Learning Barriers/Preferences:  Learning Barriers/Preferences - 04/07/24 1310       Learning Barriers/Preferences  Learning Barriers None    Learning Preferences Individual Instruction          General Pulmonary Education Topics:  Infection Prevention: - Provides verbal and written material to individual with discussion of infection control including proper hand washing and proper equipment cleaning during exercise session. Flowsheet Row Pulmonary Rehab from 04/13/2024 in [facility] Cardiac and Pulmonary Rehab  Date 04/13/24  Educator MB  Instruction Review Code 1- Verbalizes Understanding    Falls Prevention: - Provides verbal and written material to individual with discussion of falls prevention and safety. Flowsheet Row Pulmonary Rehab from 04/13/2024 in [facility] Cardiac and Pulmonary Rehab  Date 04/13/24  Educator MB  Instruction Review Code 1- Verbalizes Understanding    Chronic Lung Disease Review: - Group verbal instruction with posters, models, PowerPoint presentations and videos,  to review new updates, new respiratory medications, new advancements in procedures and treatments. Providing information on websites and 800 numbers for continued self-education. Includes information about supplement oxygen , available portable oxygen  systems, continuous and intermittent flow rates, oxygen  safety, concentrators, and Medicare reimbursement for oxygen . Explanation of Pulmonary Drugs, including class, frequency, complications, importance of spacers, rinsing mouth after steroid MDI's, and  proper cleaning methods for nebulizers. Review of basic lung anatomy and physiology related to function, structure, and complications of lung disease. Review of risk factors. Discussion about methods for diagnosing sleep apnea and types of masks and machines for OSA. Includes a review of the use of types of environmental controls: home humidity, furnaces, filters, dust mite/pet prevention, HEPA vacuums. Discussion about weather changes, air quality and the benefits of nasal washing. Instruction on Warning signs, infection symptoms, calling MD promptly, preventive modes, and value of vaccinations. Review of effective airway clearance, coughing and/or vibration techniques. Emphasizing that all should Create an Action Plan. Written material provided at class time. Flowsheet Row Pulmonary Rehab from 04/13/2024 in [facility] Cardiac and Pulmonary Rehab  Education need identified 04/13/24    AED/CPR: - Group verbal and written instruction with the use of models to demonstrate the basic use of the AED with the basic ABC's of resuscitation.    Tests and Procedures:  - Group verbal and visual presentation and models provide information about basic cardiac anatomy and function. Reviews the testing methods done to diagnose heart disease and the outcomes of the test results. Describes the treatment choices: Medical Management, Angioplasty, or Coronary Bypass Surgery for treating various heart conditions including Myocardial Infarction, Angina, Valve Disease, and Cardiac Arrhythmias.  Written material provided at class time.   Medication Safety: - Group verbal and visual instruction to review commonly prescribed medications for heart and lung disease. Reviews the medication, class of the drug, and side effects. Includes the steps to properly store meds and maintain the prescription regimen.  Written material given at graduation.   Other: -Provides group and verbal instruction on various topics (see  comments)   Knowledge Questionnaire Score:  Knowledge Questionnaire Score - 04/13/24 1626       Knowledge Questionnaire Score   Pre Score 16/18           Core Components/Risk Factors/Patient Goals at Admission:  Personal Goals and Risk Factors at Admission - 04/13/24 1626       Core Components/Risk Factors/Patient Goals on Admission    Weight Management Yes;Weight Maintenance    Intervention Weight Management: Develop a combined nutrition and exercise program designed to reach desired caloric intake, while maintaining appropriate intake of nutrient and fiber, sodium and fats, and appropriate energy expenditure required for the  weight goal.;Weight Management: Provide education and appropriate resources to help participant work on and attain dietary goals.;Weight Management/Obesity: Establish reasonable short term and long term weight goals.    Admit Weight 116 lb 3.2 oz (52.7 kg)    Goal Weight: Short Term 11 lb 3.2 oz (5.08 kg)    Goal Weight: Long Term 116 lb 3.2 oz (52.7 kg)    Expected Outcomes Short Term: Continue to assess and modify interventions until short term weight is achieved;Long Term: Adherence to nutrition and physical activity/exercise program aimed toward attainment of established weight goal;Weight Maintenance: Understanding of the daily nutrition guidelines, which includes 25-35% calories from fat, 7% or less cal from saturated fats, less than 200mg  cholesterol, less than 1.5gm of sodium, & 5 or more servings of fruits and vegetables daily;Understanding recommendations for meals to include 15-35% energy as protein, 25-35% energy from fat, 35-60% energy from carbohydrates, less than 200mg  of dietary cholesterol, 20-35 gm of total fiber daily;Understanding of distribution of calorie intake throughout the day with the consumption of 4-5 meals/snacks    Diabetes Yes    Intervention Provide education about signs/symptoms and action to take for hypo/hyperglycemia.;Provide  education about proper nutrition, including hydration, and aerobic/resistive exercise prescription along with prescribed medications to achieve blood glucose in normal ranges: Fasting glucose 65-99 mg/dL    Expected Outcomes Long Term: Attainment of HbA1C < 7%.;Short Term: Participant verbalizes understanding of the signs/symptoms and immediate care of hyper/hypoglycemia, proper foot care and importance of medication, aerobic/resistive exercise and nutrition plan for blood glucose control.    Hypertension Yes    Intervention Provide education on lifestyle modifcations including regular physical activity/exercise, weight management, moderate sodium restriction and increased consumption of fresh fruit, vegetables, and low fat dairy, alcohol moderation, and smoking cessation.;Monitor prescription use compliance.    Expected Outcomes Short Term: Continued assessment and intervention until BP is < 140/50mm HG in hypertensive participants. < 130/54mm HG in hypertensive participants with diabetes, heart failure or chronic kidney disease.;Long Term: Maintenance of blood pressure at goal levels.          Education:Diabetes - Individual verbal and written instruction to review signs/symptoms of diabetes, desired ranges of glucose level fasting, after meals and with exercise. Acknowledge that pre and post exercise glucose checks will be done for 3 sessions at entry of program. Flowsheet Row Pulmonary Rehab from 04/13/2024 in [facility] Cardiac and Pulmonary Rehab  Date 04/13/24  Educator MB  Instruction Review Code 1- Verbalizes Understanding    Know Your Numbers and Heart Failure: - Group verbal and visual instruction to discuss disease risk factors for cardiac and pulmonary disease and treatment options.  Reviews associated critical values for Overweight/Obesity, Hypertension, Cholesterol, and Diabetes.  Discusses basics of heart failure: signs/symptoms and treatments.  Introduces Heart Failure Zone chart for  action plan for heart failure. Written material provided at class time.   Core Components/Risk Factors/Patient Goals Review:    Core Components/Risk Factors/Patient Goals at Discharge (Final Review):    ITP Comments:  ITP Comments     Row Name 04/07/24 1318 04/13/24 1615 05/01/24 1352 05/10/24 1407 06/07/24 1030   ITP Comments Initial phone call completed. Diagnosis can be found in [facility] 7/23. EP Orientation scheduled for Thursday 8/28 at 2pm. Completed and gym orientation for pulmonary rehab. Initial ITP created and sent for review to Dr. Faud Aleskerov, Medical Director. First full day of exercise!  Patient was oriented to gym and equipment including functions, settings, policies, and procedures.  Patient's individual exercise  prescription and treatment plan were reviewed.  All starting workloads were established based on the results of the 6 minute walk test done at initial orientation visit.  The plan for exercise progression was also introduced and progression will be customized based on patient's performance and goals. 30 Day review completed. Medical Director ITP review done; changes made as directed and signed approval by Medical Director. New to program. 30 Day review completed. Medical Director ITP review done, changes made as directed, and signed approval by Medical Director.    Row Name 07/05/24 1145           ITP Comments 30 Day review completed. Medical Director ITP review done, changes made as directed, and signed approval by Medical Director.          Comments: 30 day review ITP

## 2024-07-10 ENCOUNTER — Encounter: Admitting: Emergency Medicine

## 2024-07-10 DIAGNOSIS — J449 Chronic obstructive pulmonary disease, unspecified: Secondary | ICD-10-CM | POA: Diagnosis not present

## 2024-07-10 NOTE — Progress Notes (Signed)
 Daily Session Note  Patient Details  Name: Angela Jensen MRN: [id number] Date of Birth: [birth year] Referring Provider:   Conrad Ports Pulmonary Rehab from 04/13/2024 in [facility] Cardiac and Pulmonary Rehab  Referring Provider Parris Manna, MD    Encounter Date: 07/10/2024  Check In:  Session Check In - 07/10/24 1344       Check-In   Supervising physician immediately available to respond to emergencies See telemetry face sheet for immediately available ER MD    Location [facility]-Cardiac & Pulmonary Rehab    Staff Present Leita Franks RN,BSN;Maxon Burnell BS, Exercise Physiologist;Margaret Best, MS, Exercise Physiologist;Noah Tickle, BS, Exercise Physiologist    Virtual Visit No    Medication changes reported     No    Fall or balance concerns reported    No    Warm-up and Cool-down Performed on first and last piece of equipment    Resistance Training Performed Yes    VAD Patient? No    PAD/SET Patient? No      Pain Assessment   Currently in Pain? No/denies             Social History   Tobacco Use  Smoking Status Former   Current packs/day: 0.00   Average packs/day: 1 pack/day for 37.0 years (37.0 ttl pk-yrs)   Types: Cigarettes   Start date: 02/06/1973   Quit date: 02/06/2010   Years since quitting: 14.4  Smokeless Tobacco Former   Types: Snuff    Goals Met:  Proper associated with RPD/PD & O2 Sat Independence with exercise equipment Using PLB without cueing & demonstrates good technique Exercise tolerated well No report of concerns or symptoms today Strength training completed today  Goals Unmet:  Not Applicable  Comments: Pt able to follow exercise prescription today without complaint.  Will continue to monitor for progression.    Dr. Oneil Pinal is Medical Director for [facility] Cardiac Rehabilitation.  Dr. Fuad Aleskerov is Medical Director for [facility] Pulmonary Rehabilitation.

## 2024-07-12 ENCOUNTER — Encounter: Admitting: Emergency Medicine

## 2024-07-12 DIAGNOSIS — J449 Chronic obstructive pulmonary disease, unspecified: Secondary | ICD-10-CM

## 2024-07-12 NOTE — Progress Notes (Signed)
 Daily Session Note  Patient Details  Name: Angela Jensen MRN: [id number] Date of Birth: Oct 20, 1955 Referring Provider:   Conrad Ports Pulmonary Rehab from 04/13/2024 in [facility] Cardiac and Pulmonary Rehab  Referring Provider Parris Manna, MD    Encounter Date: 07/12/2024  Check In:  Session Check In - 07/12/24 1358       Check-In   Supervising physician immediately available to respond to emergencies See telemetry face sheet for immediately available ER MD    Location [facility]-Cardiac & Pulmonary Rehab    Staff Present Leita Franks RN,BSN;Noah Tickle, BS, Exercise Physiologist;Margaret Best, MS, Exercise Physiologist;Meredith Tressa RN,BSN    Virtual Visit No    Medication changes reported     No    Fall or balance concerns reported    No    Warm-up and Cool-down Performed on first and last piece of equipment    Resistance Training Performed Yes    VAD Patient? No    PAD/SET Patient? No      Pain Assessment   Currently in Pain? No/denies             Social History   Tobacco Use  Smoking Status Former   Current packs/day: 0.00   Average packs/day: 1 pack/day for 37.0 years (37.0 ttl pk-yrs)   Types: Cigarettes   Start date: 02/06/1973   Quit date: 02/06/2010   Years since quitting: 14.4  Smokeless Tobacco Former   Types: Snuff    Goals Met:  Proper associated with RPD/PD & O2 Sat Independence with exercise equipment Using PLB without cueing & demonstrates good technique Exercise tolerated well No report of concerns or symptoms today Strength training completed today  Goals Unmet:  Not Applicable  Comments: Pt able to follow exercise prescription today without complaint.  Will continue to monitor for progression.    Dr. Oneil Pinal is Medical Director for [facility] Cardiac Rehabilitation.  Dr. Fuad Aleskerov is Medical Director for [facility] Pulmonary Rehabilitation.

## 2024-07-14 ENCOUNTER — Other Ambulatory Visit: Payer: Self-pay | Admitting: Nurse Practitioner

## 2024-07-14 DIAGNOSIS — Z79899 Other long term (current) drug therapy: Secondary | ICD-10-CM

## 2024-07-17 ENCOUNTER — Encounter: Attending: Pulmonary Disease | Admitting: Emergency Medicine

## 2024-07-17 DIAGNOSIS — J449 Chronic obstructive pulmonary disease, unspecified: Secondary | ICD-10-CM | POA: Diagnosis present

## 2024-07-17 NOTE — Progress Notes (Signed)
 Daily Session Note  Patient Details  Name: Angela Jensen MRN: [id number] Date of Birth: [birth year] Referring Provider:   Conrad Ports Pulmonary Rehab from 04/13/2024 in [facility] Cardiac and Pulmonary Rehab  Referring Provider Parris Manna, MD    Encounter Date: 07/17/2024  Check In:  Session Check In - 07/17/24 1413       Check-In   Supervising physician immediately available to respond to emergencies See telemetry face sheet for immediately available ER MD    Location [facility]-Cardiac & Pulmonary Rehab    Staff Present Leita Franks RN,BSN;Maxon Burnell BS, Exercise Physiologist;Margaret Best, MS, Exercise Physiologist;Noah Tickle, BS, Exercise Physiologist    Virtual Visit No    Medication changes reported     No    Fall or balance concerns reported    No    Warm-up and Cool-down Performed on first and last piece of equipment    Resistance Training Performed Yes    VAD Patient? No    PAD/SET Patient? No      Pain Assessment   Currently in Pain? No/denies             Social History   Tobacco Use  Smoking Status Former   Current packs/day: 0.00   Average packs/day: 1 pack/day for 37.0 years (37.0 ttl pk-yrs)   Types: Cigarettes   Start date: 02/06/1973   Quit date: 02/06/2010   Years since quitting: 14.4  Smokeless Tobacco Former   Types: Snuff    Goals Met:  Proper associated with RPD/PD & O2 Sat Independence with exercise equipment Using PLB without cueing & demonstrates good technique Exercise tolerated well No report of concerns or symptoms today Strength training completed today  Goals Unmet:  Not Applicable  Comments: Pt able to follow exercise prescription today without complaint.  Will continue to monitor for progression.    Dr. Oneil Pinal is Medical Director for [facility] Cardiac Rehabilitation.  Dr. Fuad Aleskerov is Medical Director for [facility] Pulmonary Rehabilitation.

## 2024-07-19 ENCOUNTER — Encounter

## 2024-07-20 ENCOUNTER — Other Ambulatory Visit: Payer: Self-pay | Admitting: Nurse Practitioner

## 2024-07-20 DIAGNOSIS — Z09 Encounter for follow-up examination after completed treatment for conditions other than malignant neoplasm: Secondary | ICD-10-CM

## 2024-07-20 DIAGNOSIS — J449 Chronic obstructive pulmonary disease, unspecified: Secondary | ICD-10-CM

## 2024-07-22 ENCOUNTER — Other Ambulatory Visit: Payer: Self-pay | Admitting: Nurse Practitioner

## 2024-07-22 DIAGNOSIS — Z09 Encounter for follow-up examination after completed treatment for conditions other than malignant neoplasm: Secondary | ICD-10-CM

## 2024-07-22 DIAGNOSIS — J449 Chronic obstructive pulmonary disease, unspecified: Secondary | ICD-10-CM

## 2024-07-24 ENCOUNTER — Encounter

## 2024-07-26 ENCOUNTER — Encounter

## 2024-07-31 ENCOUNTER — Encounter

## 2024-08-02 ENCOUNTER — Encounter

## 2024-08-02 DIAGNOSIS — J449 Chronic obstructive pulmonary disease, unspecified: Secondary | ICD-10-CM

## 2024-08-02 NOTE — Progress Notes (Signed)
 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 (HCC)  Patient's Home Medications on Admission: Current Medications[1]  Past Medical History: Past Medical History:  Diagnosis Date   Anemia    Arthritis    Asthma    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   Cancer (HCC) 2012   Right Lung CA   COPD (chronic obstructive pulmonary disease) (HCC)    Depression    Diabetes mellitus without complication (HCC)    Patient takes Janumet   Essential hypertension 05/26/2016   Heart failure (HCC) 2022   Hydropneumothorax 05/03/2020   Hypercholesteremia    Oxygen  dependent    2L at nite    PAD (peripheral artery disease) 06/22/2016   Peripheral vascular disease    Personal history of radiation therapy    Shortness of breath dyspnea    with exertion    Sialolithiasis    Sleep apnea    Wears dentures    full upper and lower    Tobacco Use: Tobacco Use History[2]  Labs: Review Flowsheet  More data exists      Latest Ref Rng & Units 01/12/2024 01/27/2024 04/09/2024 04/18/2024 04/21/2024  Labs for ITP Cardiac and Pulmonary Rehab  Hemoglobin A1c 4.8 - 5.6 % 8.0  - - 7.2  -  Bicarbonate 20.0 - 28.0 mmol/L - 40.2  29.9  38.0  37.2  32.7   O2 Saturation % - 41.6  99.1  88.7  59.6     Details       Multiple values from one day are sorted in reverse-chronological order          Pulmonary Assessment Scores:  Pulmonary Assessment Scores     Row Name 04/13/24 1624         ADL UCSD   ADL Phase Entry     SOB Score total 71      Rest 0     Walk 3     Stairs 4     Bath 4     Dress 3     Shop 3       CAT Score   CAT Score 26       mMRC Score   mMRC Score 2        UCSD: Self-administered rating of dyspnea associated with activities of daily living (ADLs) 6-point scale (0 = not at all to 5 = maximal or unable to do because of breathlessness)  Scoring Scores range from 0 to 120.  Minimally important difference is 5 units  CAT: CAT can identify the health impairment of COPD patients and is better correlated with disease progression.  CAT has a scoring range of zero to 40. The CAT score is classified into four groups of low (less than 10), medium (10 - 20), high (21-30) and very high (31-40) based on the impact level of disease on health status. A CAT score over 10 suggests significant symptoms.  A worsening CAT score could be explained by an exacerbation, poor medication adherence, poor inhaler technique, or progression of COPD or comorbid conditions.  CAT MCID is 2  points  mMRC: mMRC (Modified Medical Research Council) Dyspnea Scale is used to assess the degree of baseline functional disability in patients of respiratory disease due to dyspnea. No minimal important difference is established. A decrease in score of 1 point or greater is considered a positive change.   Pulmonary Function Assessment:   Exercise Target Goals: Exercise Program Goal: Individual exercise prescription set using results from initial 6 min walk test and THRR while considering  patients activity barriers and safety.   Exercise Prescription Goal: Initial exercise prescription builds to 30-45 minutes a day of aerobic activity, 2-3 days per week.  Home exercise guidelines will be given to patient during program as part of exercise prescription that the participant will acknowledge.  Education: Aerobic Exercise: - Group verbal and visual presentation on the components of exercise prescription. Introduces F.I.T.T principle from ACSM  for exercise prescriptions.  Reviews F.I.T.T. principles of aerobic exercise including progression. Written material provided at class time.   Education: Resistance Exercise: - Group verbal and visual presentation on the components of exercise prescription. Introduces F.I.T.T principle from ACSM for exercise prescriptions  Reviews F.I.T.T. principles of resistance exercise including progression. Written material provided at class time.    Education: Exercise & Equipment Safety: - Individual verbal instruction and demonstration of equipment use and safety with use of the equipment. Flowsheet Row Pulmonary Rehab from 04/13/2024 in [facility] Cardiac and Pulmonary Rehab  Date 04/13/24  Educator MB  Instruction Review Code 1- Verbalizes Understanding    Education: Exercise Physiology & General Exercise Guidelines: - Group verbal and written instruction with models to review the exercise physiology of the cardiovascular system and associated critical values. Provides general exercise guidelines with specific guidelines to those with heart or lung disease.    Education: Flexibility, Balance, Mind/Body Relaxation: - Group verbal and visual presentation with interactive activity on the components of exercise prescription. Introduces F.I.T.T principle from ACSM for exercise prescriptions. Reviews F.I.T.T. principles of flexibility and balance exercise training including progression. Also discusses the mind body connection.  Reviews various relaxation techniques to help reduce and manage stress (i.e. Deep breathing, progressive muscle relaxation, and visualization). Balance handout provided to take home. Written material provided at class time.   Activity Barriers & Risk Stratification:  Activity Barriers & Cardiac Risk Stratification - 04/13/24 1620       Activity Barriers & Cardiac Risk Stratification   Activity Barriers Joint Problems;Balance Concerns;Other (comment);Assistive Device    Comments  recent stent in L leg causing numbness          6 Minute Walk:  6 Minute Walk     Row Name 04/13/24 1615         6 Minute Walk   Phase Initial     Distance 620 feet     Walk Time 6 minutes     # of Rest Breaks 0     MPH 1.17     METS 1.97     RPE 11     Perceived Dyspnea  0     VO2 Peak 6.89     Symptoms No     Resting HR 78 bpm     Resting BP 106/60     Resting Oxygen  Saturation  92 %     Exercise Oxygen  Saturation  during 6 min walk 86 %     Max Ex. HR 96 bpm     Max Ex. BP 148/72     2 Minute Post BP 132/72  Interval HR   1 Minute HR 94     2 Minute HR 94     3 Minute HR 94     4 Minute HR 96     5 Minute HR 90     6 Minute HR 95     2 Minute Post HR 76     Interval Heart Rate? Yes       Interval Oxygen    Interval Oxygen ? Yes     Baseline Oxygen  Saturation % 92 %     1 Minute Oxygen  Saturation % 91 %     1 Minute Liters of Oxygen  4 L     2 Minute Oxygen  Saturation % 87 %     2 Minute Liters of Oxygen  4 L     3 Minute Oxygen  Saturation % 86 %     3 Minute Liters of Oxygen  4 L     4 Minute Oxygen  Saturation % 88 %     4 Minute Liters of Oxygen  4 L     5 Minute Oxygen  Saturation % 87 %     5 Minute Liters of Oxygen  4 L     6 Minute Oxygen  Saturation % 87 %     6 Minute Liters of Oxygen  4 L     2 Minute Post Oxygen  Saturation % 95 %     2 Minute Post Liters of Oxygen  4 L       Oxygen  Initial Assessment:  Oxygen  Initial Assessment - 04/07/24 1308       Home Oxygen    Home Oxygen  Device Home Concentrator    Sleep Oxygen  Prescription Continuous    Liters per minute 3.5    Home Exercise Oxygen  Prescription Continuous    Liters per minute 4    Home Resting Oxygen  Prescription Continuous    Liters per minute 4    Compliance with Home Oxygen  Use Yes      Intervention   Short Term Goals To learn and exhibit compliance with exercise, home and travel O2 prescription;To learn and understand importance of monitoring SPO2 with pulse oximeter and  demonstrate accurate use of the pulse oximeter.;To learn and understand importance of maintaining oxygen  saturations>88%;To learn and demonstrate proper pursed lip breathing techniques or other breathing techniques. ;To learn and demonstrate proper use of respiratory medications    Long  Term Goals Verbalizes importance of monitoring SPO2 with pulse oximeter and return demonstration;Exhibits compliance with exercise, home  and travel O2 prescription;Maintenance of O2 saturations>88%;Exhibits proper breathing techniques, such as pursed lip breathing or other method taught during program session;Compliance with respiratory medication;Demonstrates proper use of MDIs          Oxygen  Re-Evaluation:  Oxygen  Re-Evaluation     Row Name 05/01/24 1356             Goals/Expected Outcomes   Comments Reviewed PLB technique with pt.  Talked about how it works and it's importance in maintaining their exercise saturations.       Goals/Expected Outcomes Short: Become more profiecient at using PLB.   Long: Become independent at using PLB.          Oxygen  Discharge (Final Oxygen  Re-Evaluation):  Oxygen  Re-Evaluation - 05/01/24 1356       Goals/Expected Outcomes   Comments Reviewed PLB technique with pt.  Talked about how it works and it's importance in maintaining their exercise saturations.    Goals/Expected Outcomes Short: Become more profiecient at using PLB.   Long: Become independent at  using PLB.          Initial Exercise Prescription:  Initial Exercise Prescription - 04/13/24 1600       Date of Initial Exercise RX and Referring Provider   Date 04/13/24    Referring Provider Parris Manna, MD      Oxygen    Oxygen  Continuous    Liters 4    Maintain Oxygen  Saturation 88% or higher      Recumbant Bike   Level 1    RPM 50    Watts 15    Minutes 15    METs 1.97      NuStep   Level 1    SPM 80    Minutes 15    METs 1.97      Track   Laps 17    Minutes 15    METs 1.92       Prescription Details   Frequency (times per week) 2    Duration Progress to 30 minutes of continuous aerobic without signs/symptoms of physical distress      Intensity   THRR 40-80% of Max Heartrate 107-137    Ratings of Perceived Exertion 11-13    Perceived Dyspnea 0-4      Progression   Progression Continue to progress workloads to maintain intensity without signs/symptoms of physical distress.      Resistance Training   Training Prescription Yes    Weight 3 lb    Reps 10-15          Perform Capillary Blood Glucose checks as needed.  Exercise Prescription Changes:   Exercise Prescription Changes     Row Name 04/13/24 1600 05/18/24 1300 05/30/24 1400 06/29/24 1100 07/10/24 1000     Response to Exercise   Blood Pressure (Admit) 106/60 118/66 178/86 132/74 122/64   Blood Pressure (Exercise) 148/72 150/90 186/78 154/74 130/80   Blood Pressure (Exit) 132/72 100/50 146/82 142/72 120/70   Heart Rate (Admit) 78 bpm 102 bpm 93 bpm 80 bpm 95 bpm   Heart Rate (Exercise) 96 bpm 111 bpm 113 bpm 105 bpm 109 bpm   Heart Rate (Exit) 76 bpm 103 bpm 86 bpm 109 bpm 98 bpm   Oxygen  Saturation (Admit) 92 % 87 % 93 % 94 % 96 %   Oxygen  Saturation (Exercise) 86 % 84 %  came up to 88% with rest and increase to 4 L 84 %  came up to 88% with rest and increase to 4 L 86 %  came up to 88% with rest 86 %  came up to 88% with rest   Oxygen  Saturation (Exit) 95 % 87 % 92 % 93 % 91 %   Rating of Perceived Exertion (Exercise) 11 14 15 15 13    Perceived Dyspnea (Exercise) 0 1 0 1 0   Symptoms none none none none none   Comments results 1st week of exercise sessions -- -- --   Duration -- Progress to 30 minutes of  aerobic without signs/symptoms of physical distress Continue with 30 min of aerobic exercise without signs/symptoms of physical distress. Continue with 30 min of aerobic exercise without signs/symptoms of physical distress. Continue with 30 min of aerobic exercise without  signs/symptoms of physical distress.   Intensity -- THRR unchanged THRR unchanged THRR unchanged THRR unchanged     Progression   Progression -- Continue to progress workloads to maintain intensity without signs/symptoms of physical distress. Continue to progress workloads to maintain intensity without signs/symptoms of physical distress. Continue to progress  workloads to maintain intensity without signs/symptoms of physical distress. Continue to progress workloads to maintain intensity without signs/symptoms of physical distress.   Average METs 1.97 1.58 1.88 1.96 1.79     Resistance Training   Training Prescription -- Yes Yes Yes Yes   Weight -- 3 lb 3 lb 3 lb 3 lb   Reps -- 10-15 10-15 10-15 10-15     Interval Training   Interval Training -- No No No No     Oxygen    Oxygen  -- Continuous Continuous Continuous Continuous   Liters -- 3-4 3-4 6 L 6 L     Treadmill   MPH -- -- 0.7 -- --   Grade -- -- 0 -- --   Minutes -- -- 15 -- --   METs -- -- 1.54 -- --     Recumbant Bike   Level -- 1 1 2  --   Watts -- 15 15 11  --   Minutes -- 15 15 15  --   METs -- 2.85 2.89 2.64 --     NuStep   Level -- 2 1 1 2    Minutes -- 15 15 30 30    METs -- 1.5 1.6 2.1 1.8     Track   Laps -- 10 -- -- 16   Minutes -- 15 -- -- 15   METs -- 1.54 -- -- 1.87     Oxygen    Maintain Oxygen  Saturation -- 88% or higher 88% or higher 88% or higher 88% or higher    Row Name 07/27/24 1400             Response to Exercise   Blood Pressure (Admit) 100/62       Blood Pressure (Exit) 108/64       Heart Rate (Admit) 103 bpm       Heart Rate (Exercise) 115 bpm       Heart Rate (Exit) 109 bpm       Oxygen  Saturation (Admit) 90 %       Oxygen  Saturation (Exercise) 85 %       Oxygen  Saturation (Exit) 89 %       Rating of Perceived Exertion (Exercise) 13       Perceived Dyspnea (Exercise) 0       Symptoms none       Duration Continue with 30 min of aerobic exercise without signs/symptoms of physical  distress.       Intensity THRR unchanged         Progression   Progression Continue to progress workloads to maintain intensity without signs/symptoms of physical distress.       Average METs 1.83         Resistance Training   Training Prescription Yes       Weight 3 lb       Reps 10-15         Interval Training   Interval Training No         Oxygen    Oxygen  Continuous       Liters 4-6L         NuStep   Level 2       Minutes 15       METs 2.1         Biostep-RELP   Level 1       Minutes 15       METs 1         Track   Laps 30  Minutes 15       METs 3.63         Oxygen    Maintain Oxygen  Saturation 88% or higher          Exercise Comments:   Exercise Comments     Row Name 05/01/24 1352           Exercise Comments First full day of exercise!  Patient was oriented to gym and equipment including functions, settings, policies, and procedures.  Patient's individual exercise prescription and treatment plan were reviewed.  All starting workloads were established based on the results of the 6 minute walk test done at initial orientation visit.  The plan for exercise progression was also introduced and progression will be customized based on patient's performance and goals.          Exercise Goals and Review:   Exercise Goals     Row Name 04/13/24 1623             Exercise Goals   Increase Physical Activity Yes       Intervention Provide advice, education, support and counseling about physical activity/exercise needs.;Develop an individualized exercise prescription for aerobic and resistive training based on initial evaluation findings, risk stratification, comorbidities and participant's personal goals.       Expected Outcomes Short Term: Attend rehab on a regular basis to increase amount of physical activity.;Long Term: Add in home exercise to make exercise part of routine and to increase amount of physical activity.;Long Term: Exercising regularly at  least 3-5 days a week.       Increase Strength and Stamina Yes       Intervention Provide advice, education, support and counseling about physical activity/exercise needs.;Develop an individualized exercise prescription for aerobic and resistive training based on initial evaluation findings, risk stratification, comorbidities and participant's personal goals.       Expected Outcomes Short Term: Increase workloads from initial exercise prescription for resistance, speed, and METs.;Short Term: Perform resistance training exercises routinely during rehab and add in resistance training at home;Long Term: Improve cardiorespiratory fitness, muscular endurance and strength as measured by increased METs and functional capacity ( )       Able to understand and use rate of perceived exertion (RPE) scale Yes       Intervention Provide education and explanation on how to use RPE scale       Expected Outcomes Short Term: Able to use RPE daily in rehab to express subjective intensity level;Long Term:  Able to use RPE to guide intensity level when exercising independently       Able to understand and use Dyspnea scale Yes       Intervention Provide education and explanation on how to use Dyspnea scale       Expected Outcomes Long Term: Able to use Dyspnea scale to guide intensity level when exercising independently;Short Term: Able to use Dyspnea scale daily in rehab to express subjective sense of shortness of breath during exertion       Knowledge and understanding of Target Heart Rate Range (THRR) Yes       Intervention Provide education and explanation of THRR including how the numbers were predicted and where they are located for reference       Expected Outcomes Short Term: Able to state/look up THRR;Short Term: Able to use daily as guideline for intensity in rehab;Long Term: Able to use THRR to govern intensity when exercising independently       Able to check pulse independently Yes  Intervention  Provide education and demonstration on how to check pulse in carotid and radial arteries.;Review the importance of being able to check your own pulse for safety during independent exercise       Expected Outcomes Short Term: Able to explain why pulse checking is important during independent exercise;Long Term: Able to check pulse independently and accurately       Understanding of Exercise Prescription Yes       Intervention Provide education, explanation, and written materials on patient's individual exercise prescription       Expected Outcomes Short Term: Able to explain program exercise prescription;Long Term: Able to explain home exercise prescription to exercise independently          Exercise Goals Re-Evaluation :  Exercise Goals Re-Evaluation     Row Name 05/01/24 1353 05/18/24 1347 05/30/24 1410 06/15/24 1136 06/29/24 1110     Exercise Goal Re-Evaluation   Exercise Goals Review Increase Physical Activity;Able to understand and use rate of perceived exertion (RPE) scale;Knowledge and understanding of Target Heart Rate Range (THRR);Understanding of Exercise Prescription;Increase Strength and Stamina;Able to understand and use Dyspnea scale;Able to check pulse independently Increase Physical Activity;Understanding of Exercise Prescription;Increase Strength and Stamina Increase Physical Activity;Understanding of Exercise Prescription;Increase Strength and Stamina Increase Physical Activity;Understanding of Exercise Prescription;Increase Strength and Stamina Increase Physical Activity;Understanding of Exercise Prescription;Increase Strength and Stamina   Comments Reviewed RPE and dyspnea scale, THR and program prescription with pt today.  Pt voiced understanding and was given a copy of goals to take home. Ravon is off to a good start in the program. She completed her first 2 weeks in this review. She was able to work at level 2 on the T4 nustep, level 1 on the recumbent bike, and walk 10 laps on  the track. We will continue to monitor her progress in the program. Sparkle has only attended two sessions since the last review. She continues to do well at level 1 on both the recumbent bike and T4 nustep. She also began using the treadmill at a speed of 0.7 mph with no incline. We will continue to monitor her progress in the program. Shanyce did not attend rehab since the last review. She has since returned to rehab for one session. We will continue to monitor her progress in the program. [place]ana is doing well in rehab. She increased to level 2 on the recumbent bike. She maintained level 1 on the T4 nustep for 30 minutes. We will continue to monitor her progress in the program.   Expected Outcomes Short: Use RPE daily to regulate intensity.  Long: Follow program prescription in THR. Short: Continue to follow current exercise prescription. Long: Continue exercise to improve strength and stamina. Short: Attend rehab more consistently. Long: Continue exercise to improve strength and stamina. Short: Attend rehab more consistently. Long: Continue exercise to improve strength and stamina. Short: Try level 2 on the T4 nustep. Long: Continue exercise to improve strength and stamina.    Row Name 07/10/24 1048 07/27/24 1416           Exercise Goal Re-Evaluation   Exercise Goals Review Increase Physical Activity;Understanding of Exercise Prescription;Increase Strength and Stamina Increase Physical Activity;Understanding of Exercise Prescription;Increase Strength and Stamina      Comments Leocadia is doing well in rehab. She was able to walk the track in this review and increased to 16 laps. She also increased to level 2 on the T4 nustep. We will continue to monitor her progress in the program. Ronal  continues to do well in the program. She has recently been able to increase from 16 to 30 laps on the track in 15 minutes. She was also able to maintain level 2 on the T4 nustep, and add the biostep at level 1 to her prescription.  We will continue to monitor her progress in the program.      Expected Outcomes Short: Continue to progressively increase nustep workload. Long: Continue exercise to improve strength and stamina. Short: Continue to progressively increase nustep workload. Long: Continue exercise to improve strength and stamina.         Discharge Exercise Prescription (Final Exercise Prescription Changes):  Exercise Prescription Changes - 07/27/24 1400       Response to Exercise   Blood Pressure (Admit) 100/62    Blood Pressure (Exit) 108/64    Heart Rate (Admit) 103 bpm    Heart Rate (Exercise) 115 bpm    Heart Rate (Exit) 109 bpm    Oxygen  Saturation (Admit) 90 %    Oxygen  Saturation (Exercise) 85 %    Oxygen  Saturation (Exit) 89 %    Rating of Perceived Exertion (Exercise) 13    Perceived Dyspnea (Exercise) 0    Symptoms none    Duration Continue with 30 min of aerobic exercise without signs/symptoms of physical distress.    Intensity THRR unchanged      Progression   Progression Continue to progress workloads to maintain intensity without signs/symptoms of physical distress.    Average METs 1.83      Resistance Training   Training Prescription Yes    Weight 3 lb    Reps 10-15      Interval Training   Interval Training No      Oxygen    Oxygen  Continuous    Liters 4-6L      NuStep   Level 2    Minutes 15    METs 2.1      Biostep-RELP   Level 1    Minutes 15    METs 1      Track   Laps 30    Minutes 15    METs 3.63      Oxygen    Maintain Oxygen  Saturation 88% or higher          Nutrition:  Target Goals: Understanding of nutrition guidelines, daily intake of sodium 1500mg , cholesterol 200mg , calories 30% from fat and 7% or less from saturated fats, daily to have 5 or more servings of fruits and vegetables.  Education: Nutrition 1 -Group instruction provided by verbal, written material, interactive activities, discussions, models, and posters to present general  guidelines for heart healthy nutrition including macronutrients, label reading, and promoting whole foods over processed counterparts. Education serves as pensions consultant of discussion of heart healthy eating for all. Written material provided at class time.     Education: Nutrition 2 -Group instruction provided by verbal, written material, interactive activities, discussions, models, and posters to present general guidelines for heart healthy nutrition including sodium, cholesterol, and saturated fat. Providing guidance of habit forming to improve blood pressure, cholesterol, and body weight. Written material provided at class time.     Biometrics:  Pre Biometrics - 04/13/24 1623       Pre Biometrics   Height 4' 11.72 (1.517 m)    Weight 116 lb 3.2 oz (52.7 kg)    Waist Circumference 35 inches    Hip Circumference 37 inches    Waist to Hip Ratio 0.95 %    BMI (Calculated) 22.9  Single Leg Stand 21 seconds           Nutrition Therapy Plan and Nutrition Goals:  Nutrition Therapy & Goals - 04/13/24 1625       Nutrition Therapy   RD appointment deferred Yes      Personal Nutrition Goals   Nutrition Goal RD appointment deferred at this time      Intervention Plan   Intervention Prescribe, educate and counsel regarding individualized specific dietary modifications aiming towards targeted core components such as weight, hypertension, lipid management, diabetes, heart failure and other comorbidities.    Expected Outcomes Short Term Goal: Understand basic principles of dietary content, such as calories, fat, sodium, cholesterol and nutrients.          Nutrition Assessments:  MEDIFICTS Score Key: >=70 Need to make dietary changes  40-70 Heart Healthy Diet <= 40 Therapeutic Level Cholesterol Diet  Flowsheet Row Pulmonary Rehab from 04/13/2024 in [facility] Cardiac and Pulmonary Rehab  Picture Your Plate Total Score on Admission 61   Picture Your Plate Scores: <59 Unhealthy  dietary pattern with much room for improvement. 41-50 Dietary pattern unlikely to meet recommendations for good health and room for improvement. 51-60 More healthful dietary pattern, with some room for improvement.  >60 Healthy dietary pattern, although there may be some specific behaviors that could be improved.   Nutrition Goals Re-Evaluation:   Nutrition Goals Discharge (Final Nutrition Goals Re-Evaluation):   Psychosocial: Target Goals: Acknowledge presence or absence of significant depression and/or stress, maximize coping skills, provide positive support system. Participant is able to verbalize types and ability to use techniques and skills needed for reducing stress and depression.   Education: Stress, Anxiety, and Depression - Group verbal and visual presentation to define topics covered.  Reviews how body is impacted by stress, anxiety, and depression.  Also discusses healthy ways to reduce stress and to treat/manage anxiety and depression.  Written material provided at class time.   Education: Sleep Hygiene -Provides group verbal and written instruction about how sleep can affect your health.  Define sleep hygiene, discuss sleep cycles and impact of sleep habits. Review good sleep hygiene tips.    Initial Review & Psychosocial Screening:  Initial Psych Review & Screening - 04/07/24 1310       Initial Review   Current issues with Current Stress Concerns;Current Anxiety/Panic    Source of Stress Concerns Chronic Illness      Family Dynamics   Good Support System? Yes      Barriers   Psychosocial barriers to participate in program There are no identifiable barriers or psychosocial needs.      Screening Interventions   Interventions Encouraged to exercise;Provide feedback about the scores to participant    Expected Outcomes Short Term goal: Utilizing psychosocial counselor, staff and physician to assist with identification of specific Stressors or current issues  interfering with healing process. Setting desired goal for each stressor or current issue identified.;Long Term Goal: Stressors or current issues are controlled or eliminated.;Short Term goal: Identification and review with participant of any Quality of Life or Depression concerns found by scoring the questionnaire.;Long Term goal: The participant improves quality of Life and PHQ9 Scores as seen by post scores and/or verbalization of changes          Quality of Life Scores:  Scores of 19 and below usually indicate a poorer quality of life in these areas.  A difference of  2-3 points is a clinically meaningful difference.  A difference of 2-3 points  in the total score of the Quality of Life Index has been associated with significant improvement in overall quality of life, self-image, physical symptoms, and general health in studies assessing change in quality of life.  PHQ-9: Review Flowsheet  More data exists      04/13/2024 03/28/2024 10/13/2023 08/12/2023 10/08/2022  Depression screen PHQ 2/9  Decreased Interest 0 0 0 0 0  Down, Depressed, Hopeless 1 0 0 0 0  PHQ - 2 Score 1 0 0 0 0  Altered sleeping 3 - - - -  Tired, decreased energy 2 - - - -  Change in appetite 3 - - - -  Feeling bad or failure about yourself  0 - - - -  Trouble concentrating 0 - - - -  Moving slowly or fidgety/restless 0 - - - -  Suicidal thoughts 0 - - - -  PHQ-9 Score 9  - - - -  Difficult doing work/chores Very difficult - - - -    Details       Data saved with a previous flowsheet row definition        Interpretation of Total Score  Total Score Depression Severity:  1-4 = Minimal depression, 5-9 = Mild depression, 10-14 = Moderate depression, 15-19 = Moderately severe depression, 20-27 = Severe depression   Psychosocial Evaluation and Intervention:  Psychosocial Evaluation - 04/07/24 1320       Psychosocial Evaluation & Interventions   Interventions Encouraged to exercise with the program and  follow exercise prescription;Stress management education;Relaxation education    Comments Ms. Akera is coming to pulmonary rehab with COPD. She states her breathing difficulty depends on the day, but she uses her oxygen  consistently. She mentions her anxiety kicks in when she gets real short of breath and xanax  helps with that. Her stress is related to her disease process. She states she has a good support system and is looking forward to the program    Expected Outcomes Short: attend pulmonary rehab for education and exercise Long: develop and maintain positive self care habits    Continue Psychosocial Services  Follow up required by staff          Psychosocial Re-Evaluation:   Psychosocial Discharge (Final Psychosocial Re-Evaluation):   Education: Education Goals: Education classes will be provided on a weekly basis, covering required topics. Participant will state understanding/return demonstration of topics presented.  Learning Barriers/Preferences:  Learning Barriers/Preferences - 04/07/24 1310       Learning Barriers/Preferences   Learning Barriers None    Learning Preferences Individual Instruction          General Pulmonary Education Topics:  Infection Prevention: - Provides verbal and written material to individual with discussion of infection control including proper hand washing and proper equipment cleaning during exercise session. Flowsheet Row Pulmonary Rehab from 04/13/2024 in [facility] Cardiac and Pulmonary Rehab  Date 04/13/24  Educator MB  Instruction Review Code 1- Verbalizes Understanding    Falls Prevention: - Provides verbal and written material to individual with discussion of falls prevention and safety. Flowsheet Row Pulmonary Rehab from 04/13/2024 in [facility] Cardiac and Pulmonary Rehab  Date 04/13/24  Educator MB  Instruction Review Code 1- Verbalizes Understanding    Chronic Lung Disease Review: - Group verbal instruction with posters, models,  PowerPoint presentations and videos,  to review new updates, new respiratory medications, new advancements in procedures and treatments. Providing information on websites and 800 numbers for continued self-education. Includes information about supplement oxygen , available portable oxygen  systems,  continuous and intermittent flow rates, oxygen  safety, concentrators, and Medicare reimbursement for oxygen . Explanation of Pulmonary Drugs, including class, frequency, complications, importance of spacers, rinsing mouth after steroid MDI's, and proper cleaning methods for nebulizers. Review of basic lung anatomy and physiology related to function, structure, and complications of lung disease. Review of risk factors. Discussion about methods for diagnosing sleep apnea and types of masks and machines for OSA. Includes a review of the use of types of environmental controls: home humidity, furnaces, filters, dust mite/pet prevention, HEPA vacuums. Discussion about weather changes, air quality and the benefits of nasal washing. Instruction on Warning signs, infection symptoms, calling MD promptly, preventive modes, and value of vaccinations. Review of effective airway clearance, coughing and/or vibration techniques. Emphasizing that all should Create an Action Plan. Written material provided at class time. Flowsheet Row Pulmonary Rehab from 04/13/2024 in [facility] Cardiac and Pulmonary Rehab  Education need identified 04/13/24    AED/CPR: - Group verbal and written instruction with the use of models to demonstrate the basic use of the AED with the basic ABC's of resuscitation.    Tests and Procedures:  - Group verbal and visual presentation and models provide information about basic cardiac anatomy and function. Reviews the testing methods done to diagnose heart disease and the outcomes of the test results. Describes the treatment choices: Medical Management, Angioplasty, or Coronary Bypass Surgery for treating various  heart conditions including Myocardial Infarction, Angina, Valve Disease, and Cardiac Arrhythmias.  Written material provided at class time.   Medication Safety: - Group verbal and visual instruction to review commonly prescribed medications for heart and lung disease. Reviews the medication, class of the drug, and side effects. Includes the steps to properly store meds and maintain the prescription regimen.  Written material given at graduation.   Other: -Provides group and verbal instruction on various topics (see comments)   Knowledge Questionnaire Score:  Knowledge Questionnaire Score - 04/13/24 1626       Knowledge Questionnaire Score   Pre Score 16/18           Core Components/Risk Factors/Patient Goals at Admission:  Personal Goals and Risk Factors at Admission - 04/13/24 1626       Core Components/Risk Factors/Patient Goals on Admission    Weight Management Yes;Weight Maintenance    Intervention Weight Management: Develop a combined nutrition and exercise program designed to reach desired caloric intake, while maintaining appropriate intake of nutrient and fiber, sodium and fats, and appropriate energy expenditure required for the weight goal.;Weight Management: Provide education and appropriate resources to help participant work on and attain dietary goals.;Weight Management/Obesity: Establish reasonable short term and long term weight goals.    Admit Weight 116 lb 3.2 oz (52.7 kg)    Goal Weight: Short Term 11 lb 3.2 oz (5.08 kg)    Goal Weight: Long Term 116 lb 3.2 oz (52.7 kg)    Expected Outcomes Short Term: Continue to assess and modify interventions until short term weight is achieved;Long Term: Adherence to nutrition and physical activity/exercise program aimed toward attainment of established weight goal;Weight Maintenance: Understanding of the daily nutrition guidelines, which includes 25-35% calories from fat, 7% or less cal from saturated fats, less than 200mg   cholesterol, less than 1.5gm of sodium, & 5 or more servings of fruits and vegetables daily;Understanding recommendations for meals to include 15-35% energy as protein, 25-35% energy from fat, 35-60% energy from carbohydrates, less than 200mg  of dietary cholesterol, 20-35 gm of total fiber daily;Understanding of distribution of calorie intake  throughout the day with the consumption of 4-5 meals/snacks    Diabetes Yes    Intervention Provide education about signs/symptoms and action to take for hypo/hyperglycemia.;Provide education about proper nutrition, including hydration, and aerobic/resistive exercise prescription along with prescribed medications to achieve blood glucose in normal ranges: Fasting glucose 65-99 mg/dL    Expected Outcomes Long Term: Attainment of HbA1C < 7%.;Short Term: Participant verbalizes understanding of the signs/symptoms and immediate care of hyper/hypoglycemia, proper foot care and importance of medication, aerobic/resistive exercise and nutrition plan for blood glucose control.    Hypertension Yes    Intervention Provide education on lifestyle modifcations including regular physical activity/exercise, weight management, moderate sodium restriction and increased consumption of fresh fruit, vegetables, and low fat dairy, alcohol moderation, and smoking cessation.;Monitor prescription use compliance.    Expected Outcomes Short Term: Continued assessment and intervention until BP is < 140/53mm HG in hypertensive participants. < 130/12mm HG in hypertensive participants with diabetes, heart failure or chronic kidney disease.;Long Term: Maintenance of blood pressure at goal levels.          Education:Diabetes - Individual verbal and written instruction to review signs/symptoms of diabetes, desired ranges of glucose level fasting, after meals and with exercise. Acknowledge that pre and post exercise glucose checks will be done for 3 sessions at entry of program. Flowsheet Row  Pulmonary Rehab from 04/13/2024 in [facility] Cardiac and Pulmonary Rehab  Date 04/13/24  Educator MB  Instruction Review Code 1- Verbalizes Understanding    Know Your Numbers and Heart Failure: - Group verbal and visual instruction to discuss disease risk factors for cardiac and pulmonary disease and treatment options.  Reviews associated critical values for Overweight/Obesity, Hypertension, Cholesterol, and Diabetes.  Discusses basics of heart failure: signs/symptoms and treatments.  Introduces Heart Failure Zone chart for action plan for heart failure. Written material provided at class time.   Core Components/Risk Factors/Patient Goals Review:    Core Components/Risk Factors/Patient Goals at Discharge (Final Review):    ITP Comments:  ITP Comments     Row Name 04/07/24 1318 04/13/24 1615 05/01/24 1352 05/10/24 1407 06/07/24 1030   ITP Comments Initial phone call completed. Diagnosis can be found in [facility] 7/23. EP Orientation scheduled for Thursday 8/28 at 2pm. Completed and gym orientation for pulmonary rehab. Initial ITP created and sent for review to Dr. Faud Aleskerov, Medical Director. First full day of exercise!  Patient was oriented to gym and equipment including functions, settings, policies, and procedures.  Patient's individual exercise prescription and treatment plan were reviewed.  All starting workloads were established based on the results of the 6 minute walk test done at initial orientation visit.  The plan for exercise progression was also introduced and progression will be customized based on patient's performance and goals. 30 Day review completed. Medical Director ITP review done; changes made as directed and signed approval by Medical Director. New to program. 30 Day review completed. Medical Director ITP review done, changes made as directed, and signed approval by Medical Director.    Row Name 07/05/24 1145 08/02/24 0825         ITP Comments 30 Day review completed.  Medical Director ITP review done, changes made as directed, and signed approval by Medical Director. 30 Day review completed. Medical Director ITP review done, changes made as directed, and signed approval by Medical Director.         Comments: 30 day review      [1]  Current Outpatient Medications:    ACCU-CHEK  GUIDE TEST test strip, USE TO CHECK BLOOD SUGAR 5 TIMES DAILY, Disp: 450 each, Rfl: 0   Accu-Chek Softclix Lancets lancets, Use 1 lancet 3 times daily to check glucose for diabetes, Disp: 300 each, Rfl: 1   albuterol  (PROVENTIL ) (5 MG/ML) 0.5% nebulizer solution, Take 2.5 mg by nebulization every 6 (six) hours as needed for wheezing or shortness of breath., Disp: , Rfl:    albuterol  (VENTOLIN  HFA) 108 (90 Base) MCG/ACT inhaler, INHALE 2 PUFFS BY MOUTH EVERY 6 HOURS AS NEEDED FOR WHEEZING OR SHORTNESS OF BREATH, Disp: 3 each, Rfl: 5   ALPRAZolam  (XANAX ) 1 MG tablet, Take 1 tablet (1 mg total) by mouth 2 (two) times daily as needed for anxiety or sleep., Disp: 60 tablet, Rfl: 1   atorvastatin  (LIPITOR) 10 MG tablet, Take 1 tablet (10 mg total) by mouth daily., Disp: 30 tablet, Rfl: 11   Blood Glucose Monitoring Suppl (ACCU-CHEK GUIDE) w/Device KIT, Use as directed Dx e11.65, Disp: 1 kit, Rfl: 0   budesonide -glycopyrrolate -formoterol  (BREZTRI ) 160-9-4.8 MCG/ACT AERO inhaler, Inhale 2 puffs into the lungs 2 (two) times daily., Disp: 10.7 g, Rfl: 11   calcium  carbonate (OSCAL) 1500 (600 Ca) MG TABS tablet, Take 600 mg of elemental calcium  by mouth 2 (two) times daily with a meal., Disp: , Rfl:    clopidogrel  (PLAVIX ) 75 MG tablet, Take 1 tablet (75 mg total) by mouth daily., Disp: 90 tablet, Rfl: 3   cyclobenzaprine  (FLEXERIL ) 10 MG tablet, Take 1 tablet (10 mg total) by mouth at bedtime. Take one tab po qhs for back spasm prn only, Disp: 30 tablet, Rfl: 3   escitalopram  (LEXAPRO ) 10 MG tablet, Take 1 tablet by mouth once daily, Disp: 90 tablet, Rfl: 0   ferrous sulfate  325 (65 FE) MG  tablet, Take 1 tablet (325 mg total) by mouth daily with breakfast., Disp: , Rfl:    furosemide  (LASIX ) 20 MG tablet, Take 1 tablet (20 mg total) by mouth daily as needed., Disp: 30 tablet, Rfl: 5   insulin  glargine, 1 Unit Dial , (TOUJEO ) 300 UNIT/ML Solostar Pen, Inject 10 Units into the skin daily., Disp: 1.5 mL, Rfl: 11   Insulin  Pen Needle (RELION PEN NEEDLES) 31G X 6 MM MISC, USE 1  pen needle with insulin  pen ONCE DAILY for diabetes, Disp: 50 each, Rfl: 11   ipratropium-albuterol  (DUONEB) 0.5-2.5 (3) MG/3ML SOLN, USE 1 AMPULE IN NEBULIZER EVERY 6 HOURS AS NEEDED, Disp: 360 mL, Rfl: 0   losartan  (COZAAR ) 50 MG tablet, Take 1 tablet (50 mg total) by mouth daily., Disp: 90 tablet, Rfl: 1   metFORMIN  (GLUCOPHAGE -XR) 500 MG 24 hr tablet, TAKE 2 TABLETS BY MOUTH ONCE DAILY WITH BREAKFAST, Disp: 180 tablet, Rfl: 0   metoprolol  succinate (TOPROL -XL) 50 MG 24 hr tablet, Take 1 tablet (50 mg total) by mouth daily. Take with or immediately following a meal. May take 1/2 tablet if BP is less than 120/80., Disp: 90 tablet, Rfl: 3   oxyCODONE -acetaminophen  (PERCOCET) 7.5-325 MG tablet, Take 1 tablet by mouth 2 (two) times daily as needed for severe pain (pain score 7-10)., Disp: 60 tablet, Rfl: 0   oxyCODONE -acetaminophen  (PERCOCET) 7.5-325 MG tablet, Take 1 tablet by mouth 2 (two) times daily as needed for severe pain (pain score 7-10)., Disp: 60 tablet, Rfl: 0   OXYGEN , Inhale 4 L into the lungs. PT USES [facility] FOR OXYGEN , Disp: , Rfl:    pantoprazole  (PROTONIX ) 40 MG tablet, Take 1 tablet (40 mg total) by mouth daily., Disp:  90 tablet, Rfl: 3   potassium chloride  (KLOR-CON ) 10 MEQ tablet, TAKE 1 TABLET BY MOUTH EVERY OTHER DAY, Disp: 45 tablet, Rfl: 0   pregabalin  (LYRICA ) 75 MG capsule, Take 1 capsule (75 mg total) by mouth 3 (three) times daily., Disp: 90 capsule, Rfl: 0   vitamin B-12 (CYANOCOBALAMIN ) 100 MCG tablet, Take 100 mcg by mouth daily., Disp: , Rfl:    VITAMIN D , CHOLECALCIFEROL , PO,  Take 1 tablet by mouth in the morning., Disp: , Rfl:  [2]  Social History Tobacco Use  Smoking Status Former   Current packs/day: 0.00   Average packs/day: 1 pack/day for 37.0 years (37.0 ttl pk-yrs)   Types: Cigarettes   Start date: 02/06/1973   Quit date: 02/06/2010   Years since quitting: 14.4  Smokeless Tobacco Former   Types: Snuff

## 2024-08-06 ENCOUNTER — Other Ambulatory Visit (INDEPENDENT_AMBULATORY_CARE_PROVIDER_SITE_OTHER): Payer: Self-pay | Admitting: Nurse Practitioner

## 2024-08-07 ENCOUNTER — Encounter

## 2024-08-07 DIAGNOSIS — J449 Chronic obstructive pulmonary disease, unspecified: Secondary | ICD-10-CM

## 2024-08-07 MED ORDER — PREGABALIN 75 MG PO CAPS: 75.0000 mg | ORAL_CAPSULE | Freq: Three times a day (TID) | ORAL | 0 refills | Status: DC

## 2024-08-07 NOTE — Progress Notes (Signed)
 Daily Session Note  Patient Details  Name: Angela Jensen MRN: [id number] Date of Birth: Oct 25, 1955 Referring Provider:   Conrad Ports Pulmonary Rehab from 04/13/2024 in [facility] Cardiac and Pulmonary Rehab  Referring Provider Parris Manna, MD    Encounter Date: 08/07/2024  Check In:  Session Check In - 08/07/24 1417       Check-In   Supervising physician immediately available to respond to emergencies See telemetry face sheet for immediately available ER MD    Location [facility]-Cardiac & Pulmonary Rehab    Staff Present Leita Franks RN,BSN;Noah Tickle, BS, Exercise Physiologist;Kristen Coble RN,BC,MSN;Margaret Best, MS, Exercise Physiologist    Virtual Visit No    Medication changes reported     No    Fall or balance concerns reported    No    Warm-up and Cool-down Performed on first and last piece of equipment    Resistance Training Performed Yes    VAD Patient? No    PAD/SET Patient? No      Pain Assessment   Currently in Pain? No/denies             Tobacco Use History[1]  Goals Met:  Proper associated with RPD/PD & O2 Sat Independence with exercise equipment Using PLB without cueing & demonstrates good technique Exercise tolerated well No report of concerns or symptoms today Strength training completed today  Goals Unmet:  Not Applicable  Comments: Pt able to follow exercise prescription today without complaint.  Will continue to monitor for progression.    Dr. Oneil Pinal is Medical Director for [facility] Cardiac Rehabilitation.  Dr. Fuad Aleskerov is Medical Director for [facility] Pulmonary Rehabilitation.    [1]  Social History Tobacco Use  Smoking Status Former   Current packs/day: 0.00   Average packs/day: 1 pack/day for 37.0 years (37.0 ttl pk-yrs)   Types: Cigarettes   Start date: 02/06/1973   Quit date: 02/06/2010   Years since quitting: 14.5  Smokeless Tobacco Former   Types: Snuff

## 2024-08-07 NOTE — Addendum Note (Signed)
 Addended by: LANDY DOIS SAUNDERS on: 08/07/2024 08:17 AM   Modules accepted: Orders

## 2024-08-09 ENCOUNTER — Encounter

## 2024-08-10 ENCOUNTER — Other Ambulatory Visit: Payer: Self-pay

## 2024-08-10 ENCOUNTER — Emergency Department

## 2024-08-10 ENCOUNTER — Inpatient Hospital Stay
Admission: EM | Admit: 2024-08-10 | Discharge: 2024-08-18 | DRG: 871 | Disposition: A | Discharge status: Home / Self Care | Attending: Internal Medicine | Admitting: Internal Medicine

## 2024-08-10 DIAGNOSIS — Z91048 Other nonmedicinal substance allergy status: Secondary | ICD-10-CM

## 2024-08-10 DIAGNOSIS — Z833 Family history of diabetes mellitus: Secondary | ICD-10-CM

## 2024-08-10 DIAGNOSIS — A419 Sepsis, unspecified organism: Secondary | ICD-10-CM | POA: Diagnosis present

## 2024-08-10 DIAGNOSIS — Z87891 Personal history of nicotine dependence: Secondary | ICD-10-CM

## 2024-08-10 DIAGNOSIS — I110 Hypertensive heart disease with heart failure: Secondary | ICD-10-CM | POA: Diagnosis present

## 2024-08-10 DIAGNOSIS — E785 Hyperlipidemia, unspecified: Secondary | ICD-10-CM | POA: Insufficient documentation

## 2024-08-10 DIAGNOSIS — I452 Bifascicular block: Secondary | ICD-10-CM | POA: Diagnosis present

## 2024-08-10 DIAGNOSIS — Z888 Allergy status to other drugs, medicaments and biological substances status: Secondary | ICD-10-CM

## 2024-08-10 DIAGNOSIS — E1151 Type 2 diabetes mellitus with diabetic peripheral angiopathy without gangrene: Secondary | ICD-10-CM | POA: Diagnosis present

## 2024-08-10 DIAGNOSIS — G4733 Obstructive sleep apnea (adult) (pediatric): Secondary | ICD-10-CM | POA: Diagnosis present

## 2024-08-10 DIAGNOSIS — Z794 Long term (current) use of insulin: Secondary | ICD-10-CM

## 2024-08-10 DIAGNOSIS — Z95828 Presence of other vascular implants and grafts: Secondary | ICD-10-CM | POA: Diagnosis not present

## 2024-08-10 DIAGNOSIS — F32A Depression, unspecified: Secondary | ICD-10-CM | POA: Diagnosis present

## 2024-08-10 DIAGNOSIS — I739 Peripheral vascular disease, unspecified: Secondary | ICD-10-CM

## 2024-08-10 DIAGNOSIS — M79605 Pain in left leg: Secondary | ICD-10-CM

## 2024-08-10 DIAGNOSIS — K219 Gastro-esophageal reflux disease without esophagitis: Secondary | ICD-10-CM | POA: Diagnosis present

## 2024-08-10 DIAGNOSIS — Z7984 Long term (current) use of oral hypoglycemic drugs: Secondary | ICD-10-CM

## 2024-08-10 DIAGNOSIS — Z91041 Radiographic dye allergy status: Secondary | ICD-10-CM

## 2024-08-10 DIAGNOSIS — E1165 Type 2 diabetes mellitus with hyperglycemia: Secondary | ICD-10-CM | POA: Diagnosis present

## 2024-08-10 DIAGNOSIS — E7800 Pure hypercholesterolemia, unspecified: Secondary | ICD-10-CM | POA: Diagnosis present

## 2024-08-10 DIAGNOSIS — Z760 Encounter for issue of repeat prescription: Secondary | ICD-10-CM

## 2024-08-10 DIAGNOSIS — E1142 Type 2 diabetes mellitus with diabetic polyneuropathy: Secondary | ICD-10-CM | POA: Diagnosis present

## 2024-08-10 DIAGNOSIS — R6520 Severe sepsis without septic shock: Secondary | ICD-10-CM | POA: Diagnosis present

## 2024-08-10 DIAGNOSIS — I5042 Chronic combined systolic (congestive) and diastolic (congestive) heart failure: Secondary | ICD-10-CM | POA: Diagnosis present

## 2024-08-10 DIAGNOSIS — Z8701 Personal history of pneumonia (recurrent): Secondary | ICD-10-CM

## 2024-08-10 DIAGNOSIS — F1010 Alcohol abuse, uncomplicated: Secondary | ICD-10-CM | POA: Diagnosis present

## 2024-08-10 DIAGNOSIS — Z801 Family history of malignant neoplasm of trachea, bronchus and lung: Secondary | ICD-10-CM

## 2024-08-10 DIAGNOSIS — I10 Essential (primary) hypertension: Secondary | ICD-10-CM | POA: Insufficient documentation

## 2024-08-10 DIAGNOSIS — Z8249 Family history of ischemic heart disease and other diseases of the circulatory system: Secondary | ICD-10-CM | POA: Diagnosis not present

## 2024-08-10 DIAGNOSIS — Z1152 Encounter for screening for COVID-19: Secondary | ICD-10-CM | POA: Diagnosis not present

## 2024-08-10 DIAGNOSIS — J440 Chronic obstructive pulmonary disease with acute lower respiratory infection: Secondary | ICD-10-CM | POA: Diagnosis present

## 2024-08-10 DIAGNOSIS — Z7902 Long term (current) use of antithrombotics/antiplatelets: Secondary | ICD-10-CM | POA: Diagnosis not present

## 2024-08-10 DIAGNOSIS — Z860100 Personal history of colon polyps, unspecified: Secondary | ICD-10-CM

## 2024-08-10 DIAGNOSIS — J189 Pneumonia, unspecified organism: Secondary | ICD-10-CM | POA: Diagnosis present

## 2024-08-10 DIAGNOSIS — Z923 Personal history of irradiation: Secondary | ICD-10-CM

## 2024-08-10 DIAGNOSIS — Z7951 Long term (current) use of inhaled steroids: Secondary | ICD-10-CM

## 2024-08-10 DIAGNOSIS — F419 Anxiety disorder, unspecified: Secondary | ICD-10-CM | POA: Diagnosis present

## 2024-08-10 DIAGNOSIS — Z85118 Personal history of other malignant neoplasm of bronchus and lung: Secondary | ICD-10-CM

## 2024-08-10 DIAGNOSIS — J9621 Acute and chronic respiratory failure with hypoxia: Principal | ICD-10-CM | POA: Diagnosis present

## 2024-08-10 DIAGNOSIS — Z9981 Dependence on supplemental oxygen: Secondary | ICD-10-CM

## 2024-08-10 DIAGNOSIS — G8929 Other chronic pain: Secondary | ICD-10-CM

## 2024-08-10 DIAGNOSIS — J441 Chronic obstructive pulmonary disease with (acute) exacerbation: Secondary | ICD-10-CM | POA: Diagnosis present

## 2024-08-10 DIAGNOSIS — Z83438 Family history of other disorder of lipoprotein metabolism and other lipidemia: Secondary | ICD-10-CM

## 2024-08-10 DIAGNOSIS — E11649 Type 2 diabetes mellitus with hypoglycemia without coma: Secondary | ICD-10-CM | POA: Diagnosis present

## 2024-08-10 DIAGNOSIS — Z79899 Other long term (current) drug therapy: Secondary | ICD-10-CM

## 2024-08-10 LAB — BASIC METABOLIC PANEL WITH GFR
Anion gap: 9 (ref 5–15)
BUN: 15 mg/dL (ref 8–23)
CO2: 30 mmol/L (ref 22–32)
Calcium: 8.6 mg/dL — ABNORMAL LOW (ref 8.9–10.3)
Chloride: 99 mmol/L (ref 98–111)
Creatinine, Ser: 0.65 mg/dL (ref 0.44–1.00)
GFR, Estimated: >60 mL/min
Glucose, Bld: 340 mg/dL — ABNORMAL HIGH (ref 70–99)
Potassium: 4.6 mmol/L (ref 3.5–5.1)
Sodium: 138 mmol/L (ref 135–145)

## 2024-08-10 LAB — CBC
HCT: 35.3 % — ABNORMAL LOW (ref 36.0–46.0)
Hemoglobin: 10.5 g/dL — ABNORMAL LOW (ref 12.0–15.0)
MCH: 26.9 pg (ref 26.0–34.0)
MCHC: 29.7 g/dL — ABNORMAL LOW (ref 30.0–36.0)
MCV: 90.5 fL (ref 80.0–100.0)
Platelets: 210 K/uL (ref 150–400)
RBC: 3.9 MIL/uL (ref 3.87–5.11)
RDW: 14.2 % (ref 11.5–15.5)
WBC: 11.1 K/uL — ABNORMAL HIGH (ref 4.0–10.5)
nRBC: 0 % (ref 0.0–0.2)

## 2024-08-10 LAB — PROTIME-INR
INR: 1 (ref 0.8–1.2)
Prothrombin Time: 13.7 s (ref 11.4–15.2)

## 2024-08-10 LAB — COMPREHENSIVE METABOLIC PANEL WITH GFR
ALT: 20 U/L (ref 0–44)
AST: 31 U/L (ref 15–41)
Albumin: 4.3 g/dL (ref 3.5–5.0)
Alkaline Phosphatase: 61 U/L (ref 38–126)
Anion gap: 12 (ref 5–15)
BUN: 16 mg/dL (ref 8–23)
CO2: 28 mmol/L (ref 22–32)
Calcium: 9 mg/dL (ref 8.9–10.3)
Chloride: 100 mmol/L (ref 98–111)
Creatinine, Ser: 0.63 mg/dL (ref 0.44–1.00)
GFR, Estimated: >60 mL/min
Glucose, Bld: 247 mg/dL — ABNORMAL HIGH (ref 70–99)
Potassium: 4 mmol/L (ref 3.5–5.1)
Sodium: 140 mmol/L (ref 135–145)
Total Bilirubin: 0.4 mg/dL (ref 0.0–1.2)
Total Protein: 7 g/dL (ref 6.5–8.1)

## 2024-08-10 LAB — CBC WITH DIFFERENTIAL/PLATELET
Abs Immature Granulocytes: 0.03 K/uL (ref 0.00–0.07)
Basophils Absolute: 0 K/uL (ref 0.0–0.1)
Basophils Relative: 0 %
Eosinophils Absolute: 0 K/uL (ref 0.0–0.5)
Eosinophils Relative: 0 %
HCT: 38.7 % (ref 36.0–46.0)
Hemoglobin: 11.3 g/dL — ABNORMAL LOW (ref 12.0–15.0)
Immature Granulocytes: 0 %
Lymphocytes Relative: 11 %
Lymphs Abs: 1 K/uL (ref 0.7–4.0)
MCH: 26.3 pg (ref 26.0–34.0)
MCHC: 29.2 g/dL — ABNORMAL LOW (ref 30.0–36.0)
MCV: 90.2 fL (ref 80.0–100.0)
Monocytes Absolute: 0.8 K/uL (ref 0.1–1.0)
Monocytes Relative: 9 %
Neutro Abs: 6.9 K/uL (ref 1.7–7.7)
Neutrophils Relative %: 80 %
Platelets: 228 K/uL (ref 150–400)
RBC: 4.29 MIL/uL (ref 3.87–5.11)
RDW: 14.2 % (ref 11.5–15.5)
WBC: 8.7 K/uL (ref 4.0–10.5)
nRBC: 0 % (ref 0.0–0.2)

## 2024-08-10 LAB — CORTISOL-AM, BLOOD: Cortisol - AM: 10.7 ug/dL (ref 6.7–22.6)

## 2024-08-10 LAB — BLOOD GAS, VENOUS
Acid-Base Excess: 4.8 mmol/L — ABNORMAL HIGH (ref 0.0–2.0)
Bicarbonate: 32.4 mmol/L — ABNORMAL HIGH (ref 20.0–28.0)
O2 Saturation: 72.5 %
Patient temperature: 37
pCO2, Ven: 60 mmHg (ref 44–60)
pH, Ven: 7.34 (ref 7.25–7.43)
pO2, Ven: 44 mmHg (ref 32–45)

## 2024-08-10 LAB — GLUCOSE, CAPILLARY: Glucose-Capillary: 428 mg/dL — ABNORMAL HIGH (ref 70–99)

## 2024-08-10 LAB — RESP PANEL BY RT-PCR (RSV, FLU A&B, COVID)  RVPGX2
Influenza A by PCR: NEGATIVE
Influenza B by PCR: NEGATIVE
Resp Syncytial Virus by PCR: NEGATIVE
SARS Coronavirus 2 by RT PCR: NEGATIVE

## 2024-08-10 LAB — TROPONIN T, HIGH SENSITIVITY
Troponin T High Sensitivity: 34 ng/L — ABNORMAL HIGH (ref 0–19)
Troponin T High Sensitivity: 30 ng/L — ABNORMAL HIGH (ref 0–19)

## 2024-08-10 LAB — LACTIC ACID, PLASMA: Lactic Acid, Venous: 1.5 mmol/L (ref 0.5–1.9)

## 2024-08-10 LAB — PRO BRAIN NATRIURETIC PEPTIDE: Pro Brain Natriuretic Peptide: 196 pg/mL

## 2024-08-10 MED ORDER — CYCLOBENZAPRINE HCL 10 MG PO TABS: 10.0000 mg | ORAL_TABLET | Freq: Every evening | ORAL | Status: DC | PRN

## 2024-08-10 MED ORDER — PREGABALIN 75 MG PO CAPS
75.0000 mg | ORAL_CAPSULE | Freq: Three times a day (TID) | ORAL | Status: DC
  Administered 2024-08-10 – 2024-08-18 (×24): 75 mg via ORAL
  Filled 2024-08-10 (×11): qty 1

## 2024-08-10 MED ORDER — ACETAMINOPHEN 325 MG PO TABS
650.0000 mg | ORAL_TABLET | Freq: Four times a day (QID) | ORAL | Status: DC | PRN
  Filled 2024-08-10: qty 2

## 2024-08-10 MED ORDER — CLOPIDOGREL BISULFATE 75 MG PO TABS
75.0000 mg | ORAL_TABLET | Freq: Every day | ORAL | Status: DC
  Administered 2024-08-11 – 2024-08-18 (×8): 75 mg via ORAL
  Filled 2024-08-10 (×3): qty 1

## 2024-08-10 MED ORDER — VANCOMYCIN HCL 750 MG/150ML IV SOLN
750.0000 mg | INTRAVENOUS | Status: DC
  Administered 2024-08-11 – 2024-08-13 (×3): 750 mg via INTRAVENOUS
  Filled 2024-08-10 (×3): qty 150

## 2024-08-10 MED ORDER — ALPRAZOLAM 0.5 MG PO TABS
1.0000 mg | ORAL_TABLET | Freq: Two times a day (BID) | ORAL | Status: DC | PRN
  Administered 2024-08-10 – 2024-08-17 (×10): 1 mg via ORAL
  Filled 2024-08-10 (×7): qty 2

## 2024-08-10 MED ORDER — HYDROCOD POLI-CHLORPHE POLI ER 10-8 MG/5ML PO SUER: 5.0000 mL | Freq: Two times a day (BID) | ORAL | Status: DC | PRN

## 2024-08-10 MED ORDER — SODIUM CHLORIDE 0.9 % IV SOLN
500.0000 mg | INTRAVENOUS | Status: AC
  Administered 2024-08-10 – 2024-08-14 (×5): 500 mg via INTRAVENOUS
  Filled 2024-08-10 (×5): qty 5

## 2024-08-10 MED ORDER — ONDANSETRON HCL 4 MG/2ML IJ SOLN: 4.0000 mg | Freq: Four times a day (QID) | INTRAMUSCULAR | Status: DC | PRN

## 2024-08-10 MED ORDER — ENOXAPARIN SODIUM 40 MG/0.4ML IJ SOSY
40.0000 mg | PREFILLED_SYRINGE | INTRAMUSCULAR | Status: DC
  Administered 2024-08-10 – 2024-08-18 (×9): 40 mg via SUBCUTANEOUS
  Filled 2024-08-10 (×4): qty 0.4

## 2024-08-10 MED ORDER — VANCOMYCIN HCL IN DEXTROSE 1-5 GM/200ML-% IV SOLN: 1000.0000 mg | Freq: Once | INTRAVENOUS | Status: DC

## 2024-08-10 MED ORDER — IPRATROPIUM-ALBUTEROL 0.5-2.5 (3) MG/3ML IN SOLN
3.0000 mL | RESPIRATORY_TRACT | Status: DC | PRN
  Administered 2024-08-10 – 2024-08-14 (×3): 3 mL via RESPIRATORY_TRACT
  Filled 2024-08-10: qty 3

## 2024-08-10 MED ORDER — MAGNESIUM HYDROXIDE 400 MG/5ML PO SUSP: 30.0000 mL | Freq: Every day | ORAL | Status: DC | PRN

## 2024-08-10 MED ORDER — ONDANSETRON HCL 4 MG PO TABS: 4.0000 mg | ORAL_TABLET | Freq: Four times a day (QID) | ORAL | Status: DC | PRN

## 2024-08-10 MED ORDER — VITAMIN D 25 MCG (1000 UNIT) PO TABS
1000.0000 [IU] | ORAL_TABLET | Freq: Every day | ORAL | Status: DC
  Administered 2024-08-11 – 2024-08-18 (×8): 1000 [IU] via ORAL
  Filled 2024-08-10 (×3): qty 1

## 2024-08-10 MED ORDER — TRAZODONE HCL 50 MG PO TABS
25.0000 mg | ORAL_TABLET | Freq: Every evening | ORAL | Status: DC | PRN
  Administered 2024-08-12 – 2024-08-13 (×2): 25 mg via ORAL
  Filled 2024-08-10 (×2): qty 1

## 2024-08-10 MED ORDER — VITAMIN B-12 100 MCG PO TABS
100.0000 ug | ORAL_TABLET | Freq: Every day | ORAL | Status: DC
  Administered 2024-08-11 – 2024-08-18 (×8): 100 ug via ORAL
  Filled 2024-08-10 (×4): qty 1

## 2024-08-10 MED ORDER — POTASSIUM CHLORIDE CRYS ER 20 MEQ PO TBCR
10.0000 meq | EXTENDED_RELEASE_TABLET | ORAL | Status: DC
  Administered 2024-08-12 – 2024-08-18 (×4): 10 meq via ORAL
  Filled 2024-08-10 (×3): qty 1

## 2024-08-10 MED ORDER — INSULIN GLARGINE 100 UNIT/ML ~~LOC~~ SOLN
10.0000 [IU] | Freq: Every day | SUBCUTANEOUS | Status: DC
  Administered 2024-08-10: 10 [IU] via SUBCUTANEOUS
  Filled 2024-08-10 (×2): qty 0.1

## 2024-08-10 MED ORDER — SODIUM CHLORIDE 0.9 % IV SOLN
2.0000 g | Freq: Once | INTRAVENOUS | Status: AC
  Administered 2024-08-10: 2 g via INTRAVENOUS
  Filled 2024-08-10: qty 12.5

## 2024-08-10 MED ORDER — VANCOMYCIN HCL IN DEXTROSE 1-5 GM/200ML-% IV SOLN
1000.0000 mg | Freq: Once | INTRAVENOUS | Status: AC
  Administered 2024-08-10: 1000 mg via INTRAVENOUS
  Filled 2024-08-10: qty 200

## 2024-08-10 MED ORDER — ATORVASTATIN CALCIUM 10 MG PO TABS
10.0000 mg | ORAL_TABLET | Freq: Every day | ORAL | Status: DC
  Administered 2024-08-11 – 2024-08-18 (×8): 10 mg via ORAL
  Filled 2024-08-10 (×3): qty 1

## 2024-08-10 MED ORDER — OXYCODONE-ACETAMINOPHEN 7.5-325 MG PO TABS
1.0000 | ORAL_TABLET | Freq: Two times a day (BID) | ORAL | Status: DC | PRN
  Administered 2024-08-10 – 2024-08-18 (×12): 1 via ORAL
  Filled 2024-08-10 (×7): qty 1

## 2024-08-10 MED ORDER — LOSARTAN POTASSIUM 50 MG PO TABS
50.0000 mg | ORAL_TABLET | Freq: Every day | ORAL | Status: DC
  Administered 2024-08-11 – 2024-08-18 (×8): 50 mg via ORAL
  Filled 2024-08-10 (×3): qty 1

## 2024-08-10 MED ORDER — FUROSEMIDE 20 MG PO TABS
20.0000 mg | ORAL_TABLET | Freq: Every day | ORAL | Status: DC | PRN
  Administered 2024-08-16: 20 mg via ORAL

## 2024-08-10 MED ORDER — SODIUM CHLORIDE 0.9 % IV SOLN
2.0000 g | INTRAVENOUS | Status: AC
  Administered 2024-08-10 – 2024-08-14 (×5): 2 g via INTRAVENOUS
  Filled 2024-08-10 (×4): qty 20

## 2024-08-10 MED ORDER — ACETAMINOPHEN 650 MG RE SUPP: 650.0000 mg | Freq: Four times a day (QID) | RECTAL | Status: DC | PRN

## 2024-08-10 MED ORDER — METHYLPREDNISOLONE SODIUM SUCC 40 MG IJ SOLR
40.0000 mg | Freq: Two times a day (BID) | INTRAMUSCULAR | Status: DC
  Administered 2024-08-10 – 2024-08-11 (×3): 40 mg via INTRAVENOUS
  Filled 2024-08-10 (×3): qty 1

## 2024-08-10 MED ORDER — METOPROLOL SUCCINATE ER 50 MG PO TB24
50.0000 mg | ORAL_TABLET | Freq: Every day | ORAL | Status: DC
  Administered 2024-08-11 – 2024-08-18 (×8): 50 mg via ORAL
  Filled 2024-08-10 (×3): qty 1

## 2024-08-10 MED ORDER — LACTATED RINGERS IV SOLN: INTRAVENOUS | Status: DC

## 2024-08-10 MED ORDER — PANTOPRAZOLE SODIUM 40 MG PO TBEC
40.0000 mg | DELAYED_RELEASE_TABLET | Freq: Every day | ORAL | Status: DC
  Administered 2024-08-11 – 2024-08-18 (×8): 40 mg via ORAL
  Filled 2024-08-10 (×3): qty 1

## 2024-08-10 MED ORDER — ALBUTEROL SULFATE (2.5 MG/3ML) 0.083% IN NEBU
5.0000 mg | INHALATION_SOLUTION | Freq: Once | RESPIRATORY_TRACT | Status: AC
  Administered 2024-08-10: 5 mg via RESPIRATORY_TRACT
  Filled 2024-08-10: qty 6

## 2024-08-10 MED ORDER — IPRATROPIUM-ALBUTEROL 0.5-2.5 (3) MG/3ML IN SOLN
3.0000 mL | Freq: Four times a day (QID) | RESPIRATORY_TRACT | Status: DC
  Administered 2024-08-10 – 2024-08-12 (×8): 3 mL via RESPIRATORY_TRACT
  Filled 2024-08-10 (×8): qty 3

## 2024-08-10 MED ORDER — GUAIFENESIN ER 600 MG PO TB12
600.0000 mg | ORAL_TABLET | Freq: Two times a day (BID) | ORAL | Status: DC
  Administered 2024-08-10 – 2024-08-12 (×5): 600 mg via ORAL
  Filled 2024-08-10 (×5): qty 1

## 2024-08-10 MED ORDER — ESCITALOPRAM OXALATE 10 MG PO TABS
10.0000 mg | ORAL_TABLET | Freq: Every day | ORAL | Status: DC
  Administered 2024-08-11 – 2024-08-18 (×8): 10 mg via ORAL
  Filled 2024-08-10 (×3): qty 1

## 2024-08-10 MED ORDER — CALCIUM CARBONATE 1250 (500 CA) MG PO TABS
500.0000 mg | ORAL_TABLET | Freq: Two times a day (BID) | ORAL | Status: DC
  Administered 2024-08-10 – 2024-08-18 (×16): 1250 mg via ORAL
  Filled 2024-08-10 (×8): qty 1

## 2024-08-10 MED ORDER — FERROUS SULFATE 325 (65 FE) MG PO TABS
325.0000 mg | ORAL_TABLET | Freq: Every day | ORAL | Status: DC
  Administered 2024-08-10 – 2024-08-18 (×9): 325 mg via ORAL
  Filled 2024-08-10 (×4): qty 1

## 2024-08-10 MED ORDER — LACTATED RINGERS IV SOLN
150.0000 mL/h | INTRAVENOUS | Status: AC
  Administered 2024-08-10 (×2): 150 mL/h via INTRAVENOUS

## 2024-08-10 NOTE — Assessment & Plan Note (Signed)
-   We will place the patient IV steroid therapy with IV Solu-Medrol as well as nebulized bronchodilator therapy with duonebs q.i.d. and q.4 hours p.r.n.Marland Kitchen - Mucolytic therapy will be provided with Mucinex and antibiotic therapy with IV Rocephin and Zithromax. - O2 protocol will be followed.

## 2024-08-10 NOTE — Assessment & Plan Note (Signed)
-   The patient will be placed on supplemental coverage with NovoLog . - Will continue basal coverage. - Will hold off metformin . - Will continue Lyrica .

## 2024-08-10 NOTE — Progress Notes (Signed)
 Pharmacy Antibiotic Note  Angela Jensen is a 69 y.o. female admitted on 08/10/2024 with pneumonia.  Pharmacy has been consulted for Vancomycin  dosing for 5 days.  Plan: Pt given Vancomycin  1000 mg once. Vancomycin  750 mg IV Q 24 hrs. Goal AUC 400-550. Expected AUC: 447.3 SCr used: 0.8 (12/25 SCr = 0.63)  Follow up culture results to assess for antibiotic optimization. Monitor renal function to assess for any necessary antibiotic dosing changes. Pharmacy will continue to follow and will adjust abx dosing whenever warranted.  Temp (24hrs), Avg:98.1 F (36.7 C), Min:98.1 F (36.7 C), Max:98.1 F (36.7 C)   Recent Labs  Lab 08/10/24 0342  WBC 8.7  CREATININE 0.63  LATICACIDVEN 1.5    Estimated Creatinine Clearance: 50.8 mL/min (by C-G formula based on SCr of 0.63 mg/dL).    Allergies[1]  Antimicrobials this admission: 12/25 Cefepime  >> x 1 dose 12/25 Ceftriaxone  >> x 5 days 12/25 Azithromycin  >> x 5 days 12/25 Vancomycin  >> x 5 days  Microbiology results: 12/25 BCx: Pending  Thank you for allowing pharmacy to be a part of this patients care.  Rankin [name], PharmD, [facility] 08/10/2024 5:49 AM     [1]  Allergies Allergen Reactions   Iodinated Contrast Media Itching   Trelegy Ellipta  [Fluticasone -Umeclidin-Vilant]     Had breathing issues   Bactoshield Chg [Chlorhexidine  Gluconate] Itching and Other (See Comments)    Dry. Flaking, peeling skin

## 2024-08-10 NOTE — Assessment & Plan Note (Signed)
-   Will continue Xanax  and Lexapro .

## 2024-08-10 NOTE — Assessment & Plan Note (Signed)
-   This is likely secondary to her right lower lobe pneumonia as well as COPD exacerbation. - She will be admitted to a progressive unit bed. - Will continue her on BiPAP for now. - O2 protocol will be followed. - Management otherwise as below.

## 2024-08-10 NOTE — Assessment & Plan Note (Signed)
 Will continue statin therapy

## 2024-08-10 NOTE — Progress Notes (Signed)
 Pt taken off of bipap and placed on 4L/[place] per MD

## 2024-08-10 NOTE — ED Notes (Signed)
 Pt was placed on bedpan by RN Hailey. This tech removed bedpan from under pt. Pt did not have a bowel movement. Pt pure wick put back in place and additional chux pad placed underneath pt. Pt with no further needs at this time.

## 2024-08-10 NOTE — Assessment & Plan Note (Addendum)
-   The patient will be admitted to a medical telemetry bed. - Will continue antibiotic therapy with IV Rocephin  and Zithromax . - Mucolytic therapy be provided as well as duo nebs q.i.d. and q.4 hours p.r.n. - We will follow blood and sputum cultures. - Being MRSA risk given her recurrent pneumonia will add IV vancomycin . - Sepsis is manifested by tachycardia and tachypnea. - She will be placed on hydration with IV lactated ringer .

## 2024-08-10 NOTE — Sepsis Progress Note (Signed)
 Elink monitoring for the code sepsis protocol.

## 2024-08-10 NOTE — H&P (Addendum)
 "     [facility]   PATIENT NAME: Angela Jensen    MR#:  [id number]  DATE OF BIRTH:  09-Dec-1955  DATE OF ADMISSION:  08/10/2024  PRIMARY CARE PHYSICIAN: Liana Fish, NP   Patient is coming from: Home  REQUESTING/REFERRING PHYSICIAN: Gordan Huxley, MD  CHIEF COMPLAINT:   Chief Complaint  Patient presents with   Shortness of Breath    68 y/o F, BIBA, from home for c/o shortness of breath.    HISTORY OF PRESENT ILLNESS:  Angela Jensen is a 68 y.o. female with medical history significant for asthma, COPD, osteoarthritis, essential hypertension, CHF, dyslipidemia and chronic respiratory failure on home O2 and OSA, who presented to the emergency room for worsening dyspnea since 6 PM.  She has been having cough with inability to expectorate as well as wheezing since last week in addition to dyspnea.  She has used her nebulizer therapy at home without significant help.  She was in significant respiratory distress upon arrival of EMS.  She was given 125 mg of IV Solu-Medrol  and 2 g of IV magnesium  sulfate by EMS.  She denied any fever or chills.  No nausea or vomiting or abdominal pain.  No chest pain or palpitations.  No bleeding diathesis.  No dysuria, oliguria or hematuria or flank pain.  She was in significant respiratory distress necessitating BiPAP in ED.  ED Course: When the patient came to the ER, she had a pulse currently of 91% on 5 L O2 nasal cannula and respiratory rate of 22 with a heart rate of 94.  Pulse oximetry later on was 95 to 97% on 50% FiO2 on BiPAP.  ABG showed a pH 7.34 HCO3 32.4.  BMP revealed blood glucose of 247 and LFTs were within normal.  Lactic acid was 1.5.  CBC showed hemoglobin 11.3 and hematocrit 38.7.  Respiratory panel came back negative. EKG as reviewed by me :  EKG showed sinus rhythm with a rate of 93, right bundle branch block and left posterior fascicular block and early reposition laterally and inferiorly. Imaging: Portable chest x-ray showed right  lower lobe airspace disease most likely presenting with pneumonia with recommendation for follow-up in 6 to 8 weeks to exclude underlying mass.  The patient was given IV vancomycin  and cefepime  and will advance 5 mg of nebulized albuterol .  She will be admitted to a progressive unit bed for further evaluation and management. PAST MEDICAL HISTORY:   Past Medical History:  Diagnosis Date   Anemia    Arthritis    Asthma    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   Cancer (HCC) 2012   Right Lung CA   COPD (chronic obstructive pulmonary disease) (HCC)    Depression    Diabetes mellitus without complication (HCC)    Patient takes Janumet   Essential hypertension 05/26/2016   Heart failure (HCC) 2022   Hydropneumothorax 05/03/2020   Hypercholesteremia    Oxygen  dependent    2L at nite    PAD (peripheral artery disease) 06/22/2016   Peripheral vascular disease    Personal history of radiation therapy    Shortness of breath dyspnea    with exertion    Sialolithiasis    Sleep apnea    Wears dentures    full upper and lower    PAST SURGICAL HISTORY:   Past Surgical History:  Procedure Laterality Date   APPLICATION OF CELL SAVER Left 01/21/2024   Procedure: APPLICATION OF CELL SAVER;  Surgeon: Marea Selinda RAMAN, MD;  Location: [facility];  Service: Vascular;  Laterality: Left;   CESAREAN SECTION     x3   COLONOSCOPY N/A 04/03/2024   Procedure: COLONOSCOPY;  Surgeon: Jinny Carmine, MD;  Location: [facility];  Service: Endoscopy;  Laterality: N/A;   COLONOSCOPY WITH PROPOFOL  N/A 06/25/2015   Procedure: COLONOSCOPY WITH PROPOFOL ;  Surgeon: Carmine Jinny, MD;  Location: [facility];  Service: Endoscopy;  Laterality: N/A;   COLONOSCOPY WITH PROPOFOL  N/A 07/26/2020   Procedure: COLONOSCOPY WITH PROPOFOL ;  Surgeon: Jinny Carmine, MD;  Location: [facility];  Service: Endoscopy;  Laterality: N/A;   CYST REMOVAL LEG     and on shoulder     ENDARTERECTOMY FEMORAL Left 01/21/2024   Procedure: LEFT COMMON, SUPERFICIAL FEMORAL AND PROFUNDA FEMORIS ENDARTERECTOMY WITH BOVINE PERICARDIAL PATCH ANGIOPLASTY;  Surgeon: Marea Selinda RAMAN, MD;  Location: [facility];  Service: Vascular;  Laterality: Left;   ESOPHAGOGASTRODUODENOSCOPY (EGD) WITH PROPOFOL  N/A 07/26/2020   Procedure: ESOPHAGOGASTRODUODENOSCOPY (EGD) WITH PROPOFOL ;  Surgeon: Jinny Carmine, MD;  Location: [facility];  Service: Endoscopy;  Laterality: N/A;  Diabetic - oral meds   INSERTION OF ILIAC STENT Left 01/21/2024   Procedure: INSERTION, STENT, ARTERY, ILIAC;  Surgeon: Marea Selinda RAMAN, MD;  Location: [facility];  Service: Vascular;  Laterality: Left;  AND SFA STENTS   LOWER EXTREMITY ANGIOGRAPHY Left 09/29/2018   Procedure: LOWER EXTREMITY ANGIOGRAPHY;  Surgeon: Marea Selinda RAMAN, MD;  Location: [facility];  Service: Cardiovascular;  Laterality: Left;   LOWER EXTREMITY ANGIOGRAPHY Left 07/29/2023   Procedure: Lower Extremity Angiography;  Surgeon: Marea Selinda RAMAN, MD;  Location: [facility];  Service: Cardiovascular;  Laterality: Left;   LOWER EXTREMITY ANGIOGRAPHY Right 12/20/2023   Procedure: Lower Extremity Angiography;  Surgeon: Marea Selinda RAMAN, MD;  Location: [facility];  Service: Cardiovascular;  Laterality: Right;   LOWER EXTREMITY ANGIOGRAPHY Left 01/17/2024   Procedure: Lower Extremity Angiography;  Surgeon: Marea Selinda RAMAN, MD;  Location: [facility];  Service: Cardiovascular;  Laterality: Left;   LUNG BIOPSY  12/30/2011   has lung spots   PACEMAKER IMPLANT  07/14/2021   PACEMAKER LEADLESS INSERTION N/A 07/14/2021   Procedure: PACEMAKER LEADLESS INSERTION;  Surgeon: Ammon Blunt, MD;  Location: [facility];  Service: Cardiovascular;  Laterality: N/A;   PERIPHERAL VASCULAR CATHETERIZATION Left 06/01/2016   Procedure: Lower Extremity Angiography;  Surgeon: Selinda RAMAN Marea, MD;  Location: [facility];  Service: Cardiovascular;   Laterality: Left;   PERIPHERAL VASCULAR CATHETERIZATION N/A 06/01/2016   Procedure: Abdominal Aortogram w/Lower Extremity;  Surgeon: Selinda RAMAN Marea, MD;  Location: [facility];  Service: Cardiovascular;  Laterality: N/A;   PERIPHERAL VASCULAR CATHETERIZATION  06/01/2016   Procedure: Lower Extremity Intervention;  Surgeon: Selinda RAMAN Marea, MD;  Location: [facility];  Service: Cardiovascular;;   PERIPHERAL VASCULAR CATHETERIZATION Right 06/08/2016   Procedure: Lower Extremity Angiography;  Surgeon: Selinda RAMAN Marea, MD;  Location: [facility];  Service: Cardiovascular;  Laterality: Right;   PERIPHERAL VASCULAR CATHETERIZATION  06/08/2016   Procedure: Lower Extremity Intervention;  Surgeon: Selinda RAMAN Marea, MD;  Location: [facility];  Service: Cardiovascular;;   POLYPECTOMY  04/03/2024   Procedure: POLYPECTOMY, INTESTINE;  Surgeon: Jinny Carmine, MD;  Location: [facility];  Service: Endoscopy;;   SUBMANDIBULAR GLAND EXCISION Left 12/06/2020   Procedure: EXCISION SUBMANDIBULAR GLAND;  Surgeon: Herminio Miu, MD;  Location: [facility];  Service: ENT;  Laterality:  Left;  needs to be first case Diabetic - diet controlled   TEMPORARY PACEMAKER N/A 07/11/2021   Procedure: TEMPORARY PACEMAKER;  Surgeon: Ammon Blunt, MD;  Location: [facility];  Service: Cardiovascular;  Laterality: N/A;    SOCIAL HISTORY:   Social History   Tobacco Use   Smoking status: Former    Current packs/day: 0.00    Average packs/day: 1 pack/day for 37.0 years (37.0 ttl pk-yrs)    Types: Cigarettes    Start date: 02/06/1973    Quit date: 02/06/2010    Years since quitting: 14.5   Smokeless tobacco: Former    Types: Snuff  Substance Use Topics   Alcohol use: Not Currently    Alcohol/week: 5.0 standard drinks of alcohol    Types: 5 Cans of beer per week    Comment: /h x of alcohol abuse -stopped 2012- now drinks 5 beer per week    FAMILY HISTORY:   Family History   Problem Relation Age of Onset   Diabetes Mother    Hypercholesterolemia Mother    Lung cancer Father    Diabetes Sister    Diabetes Sister    Hypertension Sister    Diabetes Maternal Grandmother    Diabetes Paternal Grandmother    Heart attack Brother    Coronary artery disease Brother    Vascular Disease Brother    Heart attack Brother    Breast cancer Neg Hx     DRUG ALLERGIES:  Allergies[1]  REVIEW OF SYSTEMS:   ROS As per history of present illness. All pertinent systems were reviewed above. Constitutional, HEENT, cardiovascular, respiratory, GI, GU, musculoskeletal, neuro, psychiatric, endocrine, integumentary and hematologic systems were reviewed and are otherwise negative/unremarkable except for positive findings mentioned above in the HPI.   MEDICATIONS AT HOME:   Prior to Admission medications  Medication Sig Start Date End Date Taking? Authorizing Provider  ACCU-CHEK GUIDE TEST test strip USE TO CHECK BLOOD SUGAR 5 TIMES DAILY 06/12/24   Liana Fish, NP  Accu-Chek Softclix Lancets lancets Use 1 lancet 3 times daily to check glucose for diabetes 01/19/23   Liana Fish, NP  albuterol  (PROVENTIL ) (5 MG/ML) 0.5% nebulizer solution Take 2.5 mg by nebulization every 6 (six) hours as needed for wheezing or shortness of breath.    [provider]  albuterol  (VENTOLIN  HFA) 108 (90 Base) MCG/ACT inhaler INHALE 2 PUFFS BY MOUTH EVERY 6 HOURS AS NEEDED FOR WHEEZING OR SHORTNESS OF BREATH 10/13/23   Liana Fish, NP  ALPRAZolam  (XANAX ) 1 MG tablet Take 1 tablet (1 mg total) by mouth 2 (two) times daily as needed for anxiety or sleep. 06/15/24   Liana Fish, NP  atorvastatin  (LIPITOR) 10 MG tablet Take 1 tablet (10 mg total) by mouth daily. 07/29/23 07/28/24  Marea Selinda RAMAN, MD  Blood Glucose Monitoring Suppl (ACCU-CHEK GUIDE) w/Device KIT Use as directed Dx e11.65 04/11/21   Khan, Fozia M, MD  budesonide -glycopyrrolate -formoterol  (BREZTRI ) 160-9-4.8  MCG/ACT AERO inhaler Inhale 2 puffs into the lungs 2 (two) times daily. 04/26/24   Liana Fish, NP  calcium  carbonate (OSCAL) 1500 (600 Ca) MG TABS tablet Take 600 mg of elemental calcium  by mouth 2 (two) times daily with a meal.    [provider]  clopidogrel  (PLAVIX ) 75 MG tablet Take 1 tablet (75 mg total) by mouth daily. 10/13/23   Liana Fish, NP  cyclobenzaprine  (FLEXERIL ) 10 MG tablet Take 1 tablet (10 mg total) by mouth at bedtime. Take one tab po qhs for back spasm  prn only 10/13/23   Abernathy, Alyssa, NP  escitalopram  (LEXAPRO ) 10 MG tablet Take 1 tablet by mouth once daily 06/12/24   Abernathy, Alyssa, NP  ferrous sulfate  325 (65 FE) MG tablet Take 1 tablet (325 mg total) by mouth daily with breakfast. 04/12/24   Maree Hue, MD  furosemide  (LASIX ) 20 MG tablet Take 1 tablet (20 mg total) by mouth daily as needed. 04/27/23   Liana Fish, NP  insulin  glargine, 1 Unit Dial , (TOUJEO ) 300 UNIT/ML Solostar Pen Inject 10 Units into the skin daily. 07/27/23   Liana Fish, NP  Insulin  Pen Needle (RELION PEN NEEDLES) 31G X 6 MM MISC USE 1  pen needle with insulin  pen ONCE DAILY for diabetes 04/26/24   Liana Fish, NP  ipratropium-albuterol  (DUONEB) 0.5-2.5 (3) MG/3ML SOLN USE 1 AMPULE IN NEBULIZER EVERY 6 HOURS AS NEEDED 07/20/24   Abernathy, Alyssa, NP  losartan  (COZAAR ) 50 MG tablet Take 1 tablet (50 mg total) by mouth daily. 05/15/24   Liana Fish, NP  metFORMIN  (GLUCOPHAGE -XR) 500 MG 24 hr tablet TAKE 2 TABLETS BY MOUTH ONCE DAILY WITH BREAKFAST 01/17/24   Abernathy, Alyssa, NP  metoprolol  succinate (TOPROL -XL) 50 MG 24 hr tablet Take 1 tablet (50 mg total) by mouth daily. Take with or immediately following a meal. May take 1/2 tablet if BP is less than 120/80. 04/26/24   Abernathy, Fish, NP  oxyCODONE -acetaminophen  (PERCOCET) 7.5-325 MG tablet Take 1 tablet by mouth 2 (two) times daily as needed for severe pain (pain score 7-10). 06/15/24   Liana Fish,  NP  oxyCODONE -acetaminophen  (PERCOCET) 7.5-325 MG tablet Take 1 tablet by mouth 2 (two) times daily as needed for severe pain (pain score 7-10). 07/13/24   Liana Fish, NP  OXYGEN  Inhale 4 L into the lungs. PT USES [facility] FOR OXYGEN     [provider]  pantoprazole  (PROTONIX ) 40 MG tablet Take 1 tablet (40 mg total) by mouth daily. 05/23/24   Liana Fish, NP  potassium chloride  (KLOR-CON ) 10 MEQ tablet TAKE 1 TABLET BY MOUTH EVERY OTHER DAY 07/17/24   Abernathy, Alyssa, NP  pregabalin  (LYRICA ) 75 MG capsule Take 1 capsule (75 mg total) by mouth 3 (three) times daily. 08/07/24   Brown, Fallon E, NP  vitamin B-12 (CYANOCOBALAMIN ) 100 MCG tablet Take 100 mcg by mouth daily.    [provider]  VITAMIN D , CHOLECALCIFEROL , PO Take 1 tablet by mouth in the morning.    [provider]      VITAL SIGNS:  Blood pressure 134/85, pulse 95, temperature 98.1 F (36.7 C), temperature source Oral, resp. rate 20, height 4' 11 (1.499 m), weight 54.8 kg, SpO2 95%.  PHYSICAL EXAMINATION:  Physical Exam  GENERAL: Acute ill elderly 68 y.o.-year-old African-American patient lying in the bed with mild respiratory distress on BiPAP with conversational dyspnea. EYES: Pupils equal, round, reactive to light and accommodation. No scleral icterus. Extraocular muscles intact.  HEENT: Head atraumatic, normocephalic. Oropharynx and nasopharynx clear.  NECK:  Supple, no jugular venous distention. No thyroid  enlargement, no tenderness.  LUNGS: Diminished right basal breath sounds with right basal crackles. No use of accessory muscles of respiration.  CARDIOVASCULAR: Regular rate and rhythm, S1, S2 normal. No murmurs, rubs, or gallops.  ABDOMEN: Soft, nondistended, nontender. Bowel sounds present. No organomegaly or mass.  EXTREMITIES: 1+ bilateral lower extremity pitting edema with no cyanosis, or clubbing.  NEUROLOGIC: Cranial nerves II through XII are intact. Muscle strength 5/5  in all extremities. Sensation intact. Gait not checked.  PSYCHIATRIC:  The patient is alert and oriented x 3.  Normal affect and good eye contact. SKIN: No obvious rash, lesion, or ulcer.   LABORATORY PANEL:   CBC Recent Labs  Lab 08/10/24 0342  WBC 8.7  HGB 11.3*  HCT 38.7  PLT 228   ------------------------------------------------------------------------------------------------------------------  Chemistries  Recent Labs  Lab 08/10/24 0342  NA 140  K 4.0  CL 100  CO2 28  GLUCOSE 247*  BUN 16  CREATININE 0.63  CALCIUM  9.0  AST 31  ALT 20  ALKPHOS 61  BILITOT 0.4   ------------------------------------------------------------------------------------------------------------------  Cardiac Enzymes No results for input(s): TROPONINI in the last 168 hours. ------------------------------------------------------------------------------------------------------------------  RADIOLOGY:  DG Chest Port 1 View Result Date: 08/10/2024 EXAM: 1 VIEW(S) XRAY OF THE CHEST 08/10/2024 03:38:00 AM COMPARISON: 04/21/2024 CLINICAL HISTORY: Questionable sepsis - evaluate for abnormality FINDINGS: LUNGS AND PLEURA: New right lower lung zone airspace opacity. Background emphysema. Possible trace right pleural effusion. Right upper lobe scarring. No pneumothorax. HEART AND MEDIASTINUM: Cardiac device noted. No acute abnormality of the cardiac and mediastinal silhouettes. BONES AND SOFT TISSUES: No acute osseous abnormality. IMPRESSION: 1. Right lower lung airspace opacity most likely represents pneumonia. Follow up in 6 - 8 weeks after treatment is recommended to exclude underlying mass. Electronically signed by: Norman Gatlin MD 08/10/2024 03:50 AM EST RP Workstation: [id number]      IMPRESSION AND PLAN:  Assessment and Plan: * Acute on chronic respiratory failure with hypoxia (HCC) - This is likely secondary to her right lower lobe pneumonia as well as COPD exacerbation. - She will be  admitted to a progressive unit bed. - Will continue her on BiPAP for now. - O2 protocol will be followed. - Management otherwise as below.  Sepsis due to pneumonia [facility]) - The patient will be admitted to a medical telemetry bed. - Will continue antibiotic therapy with IV Rocephin  and Zithromax . - Mucolytic therapy be provided as well as duo nebs q.i.d. and q.4 hours p.r.n. - We will follow blood and sputum cultures. - Being MRSA risk given her recurrent pneumonia will add IV vancomycin . - Sepsis is manifested by tachycardia and tachypnea. - She will be placed on hydration with IV lactated ringer .  COPD with acute exacerbation (HCC) - We will place the patient IV steroid therapy with IV Solu-Medrol  as well as nebulized bronchodilator therapy with duonebs q.i.d. and q.4 hours p.r.n.SABRA - Mucolytic therapy will be provided with Mucinex  and antibiotic therapy with IV Rocephin  and Zithromax . - O2 protocol will be followed.   Essential hypertension - Will continue antihypertensive therapy.  GERD without esophagitis - We will continue PPI therapy.  Type 2 diabetes mellitus with peripheral neuropathy (HCC) - The patient will be placed on supplemental coverage with NovoLog . - Will continue basal coverage. - Will hold off metformin . - Will continue Lyrica .  Dyslipidemia - Will continue statin therapy.  Anxiety and depression - Will continue Xanax  and Lexapro .   DVT prophylaxis: Lovenox .  Advanced Care Planning:  Code Status: full code.  Family Communication:  The plan of care was discussed in details with the patient (and family). I answered all questions. The patient agreed to proceed with the above mentioned plan. Further management will depend upon hospital course. Disposition Plan: Back to previous home environment Consults called: none.  All the records are reviewed and case discussed with ED provider.  Status is: Inpatient  At the time of the admission, it appears that the  appropriate admission status for this patient is inpatient.  This  is judged to be reasonable and necessary in order to provide the required intensity of service to ensure the patient's safety given the presenting symptoms, physical exam findings and initial radiographic and laboratory data in the context of comorbid conditions.  The patient requires inpatient status due to high intensity of service, high risk of further deterioration and high frequency of surveillance required.  I certify that at the time of admission, it is my clinical judgment that the patient will require inpatient hospital care extending more than 2 midnights.                            Dispo: The patient is from: Home              Anticipated d/c is to: Home              Patient currently is not medically stable to d/c.              Difficult to place patient: No  Authorized and performed by: Madison Peaches, MD Total critical care time:   55     minutes. Due to a high probability of clinically significant, life-threatening deterioration, the patient required my highest level of preparedness to intervene emergently and I personally spent this critical care time directly and personally managing the patient.  This critical care time included obtaining a history, examining the patient, pulse oximetry, ordering and review of studies, arranging urgent treatment with development of management plan, evaluation of patient's response to treatment, frequent reassessment, and discussions with other providers. This critical care time was performed to assess and manage the high probability of imminent, life-threatening deterioration that could result in multiorgan failure.  It was exclusive of separately billable procedures and treating other patients and teaching time.   Madison DELENA Peaches M.D on 08/10/2024 at 5:21 AM  [facility]   From 7 PM-7 AM, contact night-coverage [email or web address]  CC: Primary care physician; Liana Fish, NP      [1]  Allergies Allergen Reactions   Iodinated Contrast Media Itching   Trelegy Ellipta  [Fluticasone -Umeclidin-Vilant]     Had breathing issues   Bactoshield Chg [Chlorhexidine  Gluconate] Itching and Other (See Comments)    Dry. Flaking, peeling skin   "

## 2024-08-10 NOTE — Progress Notes (Signed)
" °  Progress Note   Patient: Angela Jensen FMW:[id number] DOB: July 03, 1956 DOA: 08/10/2024     0 DOS: the patient was seen and examined on 08/10/2024  Nonbillable note Patient was seen during rounds today Patient was admitted earlier this day Currently on BiPAP with FiO2 50% Respiratory function have improved and I have asked respiratory therapist to wean patient off BiPAP For detailed H&P please refer to earlier documented H&P by Dr. Lawence  Vitals:   08/10/24 1100 08/10/24 1221 08/10/24 1400 08/10/24 1500  BP: (!) 147/79 (!) 146/88 (!) 167/84 (!) 172/86  Pulse: 87 87 89 91  Resp: 14 12 14 17   Temp:  97.9 F (36.6 C)  98 F (36.7 C)  TempSrc:  Oral  Oral  SpO2: 97% 100% 96% 96%  Weight:      Height:         Author: Drue ONEIDA Potter, MD 08/10/2024 4:08 PM  For on call review [email or web address].  "

## 2024-08-10 NOTE — ED Provider Notes (Signed)
 "   [facility] Provider Note    Event Date/Time   First MD Initiated Contact with Patient 08/10/24 [phone]     (approximate)   History   Shortness of Breath (68 y/o F, BIBA, from home for c/o shortness of breath.)   HPI Angela Jensen is a 68 y.o. female with extensive medical issues including CHF and COPD on 5 L of oxygen  at baseline.  She presents by EMS for worsening shortness of breath.  She states that she was in her normal state of health until 6 PM when all of a sudden she became more short of breath.  No pain, but it became more difficult to breathe.  She tried her home breathing treatments and they did not help.  EMS was called and they provided Solu-Medrol  125 mg IV and a breathing treatment as well as 2 g of IV magnesium .  She is feeling a little bit better but still not back to normal upon arrival.  She denies fever, chest pain, nausea, vomiting, and abdominal pain     Physical Exam   Triage Vital Signs: ED Triage Vitals  Encounter Vitals Group     BP 08/10/24 0327 134/85     Girls Systolic BP Percentile --      Girls Diastolic BP Percentile --      Boys Systolic BP Percentile --      Boys Diastolic BP Percentile --      Pulse Rate 08/10/24 0327 94     Resp 08/10/24 0327 (!) 22     Temp 08/10/24 0327 98.1 F (36.7 C)     Temp Source 08/10/24 0327 Oral     SpO2 08/10/24 0327 91 %     Weight 08/10/24 0328 54.8 kg (120 lb 13 oz)     Height 08/10/24 0328 1.499 m (4' 11)     Head Circumference --      Peak Flow --      Pain Score 08/10/24 0328 0     Pain Loc --      Pain Education --      Exclude from Growth Chart --     Most recent vital signs: Vitals:   08/10/24 0327 08/10/24 0400  BP: 134/85   Pulse: 94 95  Resp: (!) 22 20  Temp: 98.1 F (36.7 C)   SpO2: 91% 95%    General: Awake, alert, pleasant and conversant, but appears chronically ill and is acutely short of breath CV:  Good peripheral perfusion.  Regular rate and rhythm,  normal heart sounds Resp:  Increased respiratory effort with mild accessory muscle usage and intercostal retractions.  Minimal air movement throughout; there is no wheezing but also not much air moving.  Lung sounds are equal bilaterally. Abd:  No distention.  Other:  Alert and oriented   ED Results / Procedures / Treatments   Labs (all labs ordered are listed, but only abnormal results are displayed) Labs Reviewed  COMPREHENSIVE METABOLIC PANEL WITH GFR - Abnormal; Notable for the following components:      Result Value   Glucose, Bld 247 (*)    All other components within normal limits  CBC WITH DIFFERENTIAL/PLATELET - Abnormal; Notable for the following components:   Hemoglobin 11.3 (*)    MCHC 29.2 (*)    All other components within normal limits  BLOOD GAS, VENOUS - Abnormal; Notable for the following components:   Bicarbonate 32.4 (*)    Acid-Base Excess 4.8 (*)  All other components within normal limits  TROPONIN T, HIGH SENSITIVITY - Abnormal; Notable for the following components:   Troponin T High Sensitivity 34 (*)    All other components within normal limits  RESP PANEL BY RT-PCR (RSV, FLU A&B, COVID)  RVPGX2  CULTURE, BLOOD (ROUTINE X 2)  CULTURE, BLOOD (ROUTINE X 2)  LACTIC ACID, PLASMA  PRO BRAIN NATRIURETIC PEPTIDE     EKG  ED ECG REPORT I, Darleene Dome, the attending physician, personally viewed and interpreted this ECG.  Date: 08/10/2024 EKG Time: 3:26 AM Rate: 93 Rhythm: normal sinus rhythm QRS Axis: normal Intervals: normal ST/T Wave abnormalities: Generalized ST segment elevation likely representative of demand ischemia Narrative Interpretation: no definitive evidence of acute ischemia; does not meet STEMI criteria.    RADIOLOGY See ED course for details   PROCEDURES:  Critical Care performed: Yes, see critical care procedure note(s)  .1-3 Lead EKG Interpretation  Performed by: Dome Darleene, MD Authorized by: Dome Darleene, MD      Interpretation: normal     ECG rate:  92   ECG rate assessment: normal     Rhythm: sinus rhythm     Ectopy: none     Conduction: normal   .Critical Care  Performed by: Dome Darleene, MD Authorized by: Dome Darleene, MD   Critical care provider statement:    Critical care time (minutes):  45   Critical care time was exclusive of:  Separately billable procedures and treating other patients   Critical care was necessary to treat or prevent imminent or life-threatening deterioration of the following conditions:  Respiratory failure   Critical care was time spent personally by me on the following activities:  Development of treatment plan with patient or surrogate, evaluation of patient's response to treatment, examination of patient, obtaining history from patient or surrogate, ordering and performing treatments and interventions, ordering and review of laboratory studies, ordering and review of radiographic studies, pulse oximetry, re-evaluation of patient's condition and review of old charts     IMPRESSION / MDM / ASSESSMENT AND PLAN / ED COURSE  I reviewed the triage vital signs and the nursing notes.                              Differential diagnosis includes, but is not limited to, COPD exacerbation, CHF exacerbation, ACS, PE, pneumonia.  Patient's presentation is most consistent with acute presentation with potential threat to life or bodily function.  Labs/studies ordered: I ordered standard sepsis labs/studies including the following: respiratory viral panel PCR swab, blood cultures x2, pro time-INR, CMP, urinalysis, urine culture, lactic acid, CBC with differential, high-sensitivity troponin, lipase, 1-view chest x-ray, EKG.  Interventions/Medications given:  Medications  lactated ringers  infusion (has no administration in time range)  vancomycin  (VANCOCIN ) IVPB 1000 mg/200 mL premix (has no administration in time range)  ceFEPIme  (MAXIPIME ) 2 g in sodium chloride  0.9 % 100  mL IVPB (has no administration in time range)  albuterol  (PROVENTIL ) (2.5 MG/3ML) 0.083% nebulizer solution 5 mg (5 mg Nebulization Given 08/10/24 0447)    (Note:  hospital course my include additional interventions and/or labs/studies not listed above.)   Vital signs are stable except for hypoxia; even on her chronic 5 L, she goes down to 83% with a good waveform.  I called respiratory to let them know that I am ordering BiPAP; the patient is familiar with it and agrees with the plan).  Broad workup is pending  including chest x-ray.  Patient does not require emergent intubation at this time and she is afebrile.  The patient is on the cardiac monitor to evaluate for evidence of arrhythmia and/or significant heart rate changes.   Clinical Course as of 08/10/24 0455  Thu Aug 10, 2024  0440 Blood gas, venous(!) Reassuring VBG [CF]  0441 Comprehensive metabolic panel(!) Normal CMP other than hyperglycemia [CF]  0441 Troponin T High Sensitivity(!): 34 Minimally elevated troponin, likely demand ischemia [CF]  0441 Pro Brain Natriuretic Peptide: 196.0 Normal BMP [CF]  0441 CBC with Differential(!) No leukocytosis [CF]  0441 Lactic Acid, Venous: 1.5 Normal lactic acid [CF]  0453 Despite the relatively reassuring lab workup, she technically meets criteria for severe sepsis given the worsening oxygen  requirement, tachypnea, and heart rate greater than 90.  I am making her sepsis and ordering broad-spectrum antibiotics given her recent treatment for pneumonia and her hospitalization less than 2 months ago.  She is getting cefepime  2 g IV and vancomycin  per pharmacy consult.  Given no evidence of septic shock and a normal lactic acid, I am not going to give her the full 30 mL/kg of IV fluids particularly given her history of heart failure.  I discussed the findings with the family and she and her family agree with the plan for admission.  She is feeling better on BiPAP and satting 98%.  I am  consulting the hospitalist team for admission. [CF]  [phone] I consulted by phone with the admitting hospitalist, and they will admit the patient - Dr. Lawence. [CF]    Clinical Course User Index [CF] Gordan Huxley, MD     FINAL CLINICAL IMPRESSION(S) / ED DIAGNOSES   Final diagnoses:  Acute on chronic respiratory failure with hypoxia (HCC)  COPD exacerbation (HCC)  Pneumonia of right lower lobe due to infectious organism  Severe sepsis (HCC)     Rx / DC Orders   ED Discharge Orders     None        Note:  This document was prepared using Dragon voice recognition software and may include unintentional dictation errors.   Gordan Huxley, MD 08/10/24 [phone]  "

## 2024-08-10 NOTE — Assessment & Plan Note (Signed)
 -  We will continue PPI therapy

## 2024-08-10 NOTE — Assessment & Plan Note (Deleted)
-   The patient will be placed on supplemental coverage with NovoLog . - Will continue basal coverage.-Will hold off metformin.

## 2024-08-10 NOTE — Assessment & Plan Note (Signed)
-   Will continue antihypertensive therapy.

## 2024-08-10 NOTE — Progress Notes (Signed)
 CODE SEPSIS - PHARMACY COMMUNICATION  **Broad Spectrum Antibiotics should be administered within 1 hour of Sepsis diagnosis**  Time Code Sepsis Called/Page Received: 9547  Antibiotics Ordered: Cefepime  & Vancomycin   Time of 1st antibiotic administration: 0516  Rankin [name], PharmD, [facility] 08/10/2024 4:53 AM

## 2024-08-10 NOTE — ED Notes (Signed)
 Pt off BiPap at this time. Pt is currently on 4L via [place]. O2 saturation isn 98%.

## 2024-08-11 DIAGNOSIS — J9621 Acute and chronic respiratory failure with hypoxia: Secondary | ICD-10-CM | POA: Diagnosis not present

## 2024-08-11 LAB — CBC WITH DIFFERENTIAL/PLATELET
Abs Immature Granulocytes: 0.05 K/uL (ref 0.00–0.07)
Basophils Absolute: 0 K/uL (ref 0.0–0.1)
Basophils Relative: 0 %
Eosinophils Absolute: 0 K/uL (ref 0.0–0.5)
Eosinophils Relative: 0 %
HCT: 32.6 % — ABNORMAL LOW (ref 36.0–46.0)
Hemoglobin: 9.7 g/dL — ABNORMAL LOW (ref 12.0–15.0)
Immature Granulocytes: 0 %
Lymphocytes Relative: 3 %
Lymphs Abs: 0.4 K/uL — ABNORMAL LOW (ref 0.7–4.0)
MCH: 26.4 pg (ref 26.0–34.0)
MCHC: 29.8 g/dL — ABNORMAL LOW (ref 30.0–36.0)
MCV: 88.6 fL (ref 80.0–100.0)
Monocytes Absolute: 0.6 K/uL (ref 0.1–1.0)
Monocytes Relative: 5 %
Neutro Abs: 11.7 K/uL — ABNORMAL HIGH (ref 1.7–7.7)
Neutrophils Relative %: 92 %
Platelets: 211 K/uL (ref 150–400)
RBC: 3.68 MIL/uL — ABNORMAL LOW (ref 3.87–5.11)
RDW: 14.5 % (ref 11.5–15.5)
WBC: 12.7 K/uL — ABNORMAL HIGH (ref 4.0–10.5)
nRBC: 0 % (ref 0.0–0.2)

## 2024-08-11 LAB — BASIC METABOLIC PANEL WITH GFR
Anion gap: 7 (ref 5–15)
BUN: 20 mg/dL (ref 8–23)
CO2: 31 mmol/L (ref 22–32)
Calcium: 8.5 mg/dL — ABNORMAL LOW (ref 8.9–10.3)
Chloride: 100 mmol/L (ref 98–111)
Creatinine, Ser: 0.6 mg/dL (ref 0.44–1.00)
GFR, Estimated: >60 mL/min
Glucose, Bld: 371 mg/dL — ABNORMAL HIGH (ref 70–99)
Potassium: 4.3 mmol/L (ref 3.5–5.1)
Sodium: 137 mmol/L (ref 135–145)

## 2024-08-11 LAB — HEMOGLOBIN A1C
Hgb A1c MFr Bld: 11 % — ABNORMAL HIGH (ref 4.8–5.6)
Mean Plasma Glucose: 269 mg/dL

## 2024-08-11 LAB — GLUCOSE, CAPILLARY
Glucose-Capillary: 307 mg/dL — ABNORMAL HIGH (ref 70–99)
Glucose-Capillary: 343 mg/dL — ABNORMAL HIGH (ref 70–99)
Glucose-Capillary: 132 mg/dL — ABNORMAL HIGH (ref 70–99)
Glucose-Capillary: 166 mg/dL — ABNORMAL HIGH (ref 70–99)

## 2024-08-11 MED ORDER — INSULIN ASPART 100 UNIT/ML IJ SOLN
0.0000 [IU] | Freq: Three times a day (TID) | INTRAMUSCULAR | Status: DC
  Administered 2024-08-11: 7 [IU] via SUBCUTANEOUS
  Administered 2024-08-11: 1 [IU] via SUBCUTANEOUS
  Administered 2024-08-12: 2 [IU] via SUBCUTANEOUS
  Administered 2024-08-12 – 2024-08-13 (×2): 3 [IU] via SUBCUTANEOUS
  Administered 2024-08-13: 7 [IU] via SUBCUTANEOUS
  Filled 2024-08-11: qty 3
  Filled 2024-08-11: qty 2
  Filled 2024-08-11: qty 1
  Filled 2024-08-11: qty 3
  Filled 2024-08-11 (×2): qty 7

## 2024-08-11 MED ORDER — INSULIN GLARGINE-YFGN 100 UNIT/ML ~~LOC~~ SOLN
15.0000 [IU] | Freq: Two times a day (BID) | SUBCUTANEOUS | Status: DC
  Administered 2024-08-11 – 2024-08-14 (×7): 15 [IU] via SUBCUTANEOUS
  Filled 2024-08-11 (×6): qty 0.15

## 2024-08-11 MED ORDER — INSULIN ASPART 100 UNIT/ML IJ SOLN
0.0000 [IU] | Freq: Every day | INTRAMUSCULAR | Status: DC
  Administered 2024-08-12: 3 [IU] via SUBCUTANEOUS
  Administered 2024-08-13: 2 [IU] via SUBCUTANEOUS
  Filled 2024-08-11: qty 5
  Filled 2024-08-11: qty 3

## 2024-08-11 MED ORDER — PREDNISONE 20 MG PO TABS
40.0000 mg | ORAL_TABLET | Freq: Every day | ORAL | Status: DC
  Administered 2024-08-12 – 2024-08-13 (×2): 40 mg via ORAL
  Filled 2024-08-11: qty 2
  Filled 2024-08-11: qty 4

## 2024-08-11 NOTE — TOC Initial Note (Signed)
 Transition of Care [facility]) - Initial/Assessment Note    Patient Details  Name: Angela Jensen MRN: [id number] Date of Birth: 04/03/1956  [facility] of [facility]) CM/SW Contact:    Shasta DELENA Daring, RN Phone Number: 08/11/2024, 11:07 AM  Clinical Narrative:                  RNCM assessed patient. Patient alone in room, sitting up in bed. Alert and oriented. Confirmed PCP is [facility]. Patient lives in single family home with life partner and he provides transportation to medical appointments. Uses [facility] on [address] for pharmacy needs. Denies difficulty affording medication. Currently has walker, cane, rollator, shower chair and bedside commode in home. No current HH services. Family will provide transportation home at discharge.  5L O2 at baseline.   Will monitor for chronic O2 use.        Patient Goals and CMS Choice            Expected Discharge Plan and Services                                              Prior Living Arrangements/Services                       Activities of Daily Living      Permission Sought/Granted                  Emotional Assessment              Admission diagnosis:  Anxiety [F41.9] PVD (peripheral vascular disease) [I73.9] Medication refill [Z76.0] Left leg pain [M79.605] Gastroesophageal reflux disease without esophagitis [K21.9] COPD exacerbation (HCC) [J44.1] Chronic right shoulder pain [M25.511, G89.29] Anxiety and depression [F41.9, F32.A] Severe sepsis (HCC) [A41.9, R65.20] Encounter for medication review [Z79.899] Acute on chronic respiratory failure with hypoxia (HCC) [J96.21] Pneumonia of right lower lobe due to infectious organism [J18.9] Patient Active Problem List   Diagnosis Date Noted   Sepsis due to pneumonia (HCC) 08/10/2024   Dyslipidemia 08/10/2024   Type 2 diabetes mellitus with peripheral neuropathy (HCC) 08/10/2024   GERD without esophagitis 08/10/2024   Essential  hypertension 08/10/2024   Left leg pain 06/25/2024   Acute hypoxic on chronic hypercapnic respiratory failure (HCC) 04/18/2024   Hemoptysis 04/12/2024   COPD (chronic obstructive pulmonary disease) (HCC) 04/09/2024   Polyp of transverse colon 04/03/2024   Hematochezia 03/31/2024   Acute on chronic hypoxic respiratory failure (HCC) 03/20/2024   COPD with acute exacerbation (HCC) 03/19/2024   Chronic heart failure with preserved ejection fraction (HFpEF) (HCC) 01/28/2024   Presence of heart assist device (HCC) 01/27/2024   Atherosclerosis of artery of extremity with rest pain (HCC) 01/18/2024   Ischemic leg 01/17/2024   Presbycusis of both ears 08/13/2023   Acute on chronic respiratory failure with hypoxia and hypercapnia (HCC) 06/15/2023   Acute on chronic diastolic CHF (congestive heart failure) (HCC) 06/15/2023   Transaminitis 06/15/2023   Overweight (BMI 25.0-29.9) 06/15/2023   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   Chronic right shoulder pain 11/30/2022   Anxiety and depression 11/19/2022   Gastroesophageal reflux disease without esophagitis 11/19/2022   Uncontrolled type 2 diabetes mellitus with hyperglycemia, with long-term current use of insulin  (HCC) 11/19/2022   Right rib fracture 09/23/2022   Symptomatic bradycardia 07/09/2021   Chronic respiratory failure with hypoxia (  HCC)    CAP (community acquired pneumonia) 06/26/2021   COPD exacerbation (HCC) 06/11/2021   SOB (shortness of breath) 05/01/2021   Chronic combined systolic and diastolic CHF (congestive heart failure) (HCC) 05/01/2021   Acute exacerbation of chronic obstructive pulmonary disease (COPD) (HCC) 04/30/2021   Elevated troponin 04/01/2021   Acute on chronic respiratory failure with hypoxia (HCC) 03/31/2021   History of recurrent right pneumothorax 05/01/2020   Chest pain on breathing 04/21/2020   Pleuritic chest pain 04/11/2020   Right lower lobe pulmonary nodule 04/02/2020   IDA  (iron  deficiency anemia) 04/02/2020   Pica in adults 04/02/2020   Goals of care, counseling/discussion 03/14/2020   Pain in left wrist 09/24/2019   Encounter for general adult medical examination with abnormal findings 06/28/2019   Elevated total protein 01/22/2019   Uncontrolled type 2 diabetes mellitus with hyperglycemia (HCC) 05/16/2018   Chronic obstructive pulmonary disease (HCC) 05/16/2018   Vitamin D  deficiency 05/16/2018   Flu vaccine need 05/16/2018   Dysuria 05/16/2018   Cancer of lower lobe of right lung (HCC) 01/21/2018   Screening for breast cancer 10/12/2017   Personal history of tobacco use, presenting hazards to health 02/03/2017   PVD (peripheral vascular disease) 06/22/2016   HTN (hypertension) 05/26/2016   Diabetes mellitus (HCC) 05/26/2016   Mixed hyperlipidemia 05/26/2016   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   Uterine leiomyoma 04/30/2016   Elevated CEA 01/31/2016   Special screening for malignant neoplasms, colon    Benign neoplasm of sigmoid colon    Primary lung cancer (HCC)    PCP:  Liana Fish, NP Pharmacy:   [facility] [address]), [place] - [address] [place]) [place] [address] Phone: [phone] Fax: [phone]  [facility] [address] [place] [address] Phone: [phone] Fax: [phone]     Social Drivers of Health (SDOH) Social History: SDOH Screenings   Food Insecurity: No Food Insecurity (06/04/2024)   Received from [facility]  Housing: Low Risk (04/18/2024)  Transportation Needs: No Transportation Needs (06/04/2024)   [facility] from [facility]  Utilities: Not At Risk (04/18/2024)  Alcohol Screen: Low Risk (12/11/2021)  Depression (PHQ2-9): Medium Risk (04/13/2024)  Financial Resource Strain: Medium Risk (06/04/2024)   Received from [facility]  Social  Connections: Moderately Integrated (04/18/2024)  Tobacco Use: Medium Risk (08/10/2024)   SDOH Interventions:     Readmission Risk Interventions    08/11/2024   11:06 AM 04/19/2024   12:06 PM 03/21/2024    3:56 PM  Readmission Risk Prevention Plan  Transportation Screening Complete Complete Complete  HRI or Home Care Consult   Complete  Social Work Consult for Recovery Care Planning/Counseling   Complete  Palliative Care Screening   Not Applicable  Medication Review [employer]) Complete Complete Complete  PCP or Specialist appointment within 3-5 days of discharge Complete Complete   HRI or Home Care Consult Complete    SW Recovery Care/Counseling Consult Complete Complete   Palliative Care Screening Not Applicable Not Applicable   Skilled Nursing Facility Not Applicable Not Applicable

## 2024-08-11 NOTE — Plan of Care (Signed)

## 2024-08-11 NOTE — Progress Notes (Signed)
 " Progress Note   Patient: Angela Jensen FMW:[id number] DOB: 1956-02-08 DOA: 08/10/2024     1 DOS: the patient was seen and examined on 08/11/2024   Brief hospital course: Angela Jensen is a 68 y.o. female with medical history significant for asthma, COPD, osteoarthritis, essential hypertension, CHF, dyslipidemia and chronic respiratory failure on home O2 and OSA, who presented to the emergency room for worsening dyspnea since 6 PM.  She has been having cough with inability to expectorate as well as wheezing since last week in addition to dyspnea.  She has used her nebulizer therapy at home without significant help.  She was in significant respiratory distress upon arrival of EMS.  She was given 125 mg of IV Solu-Medrol  and 2 g of IV magnesium  sulfate by EMS.  She denied any fever or chills.  No nausea or vomiting or abdominal pain.  No chest pain or palpitations.  No bleeding diathesis.  No dysuria, oliguria or hematuria or flank pain.  She was in significant respiratory distress necessitating BiPAP in ED.   ED Course: When the patient came to the ER, she had a pulse currently of 91% on 5 L O2 nasal cannula and respiratory rate of 22 with a heart rate of 94.  Pulse oximetry later on was 95 to 97% on 50% FiO2 on BiPAP.  ABG showed a pH 7.34 HCO3 32.4.  BMP revealed blood glucose of 247 and LFTs were within normal.  Lactic acid was 1.5.  CBC showed hemoglobin 11.3 and hematocrit 38.7.  Respiratory panel came back negative. EKG as reviewed by me :  EKG showed sinus rhythm with a rate of 93, right bundle branch block and left posterior fascicular block and early reposition laterally and inferiorly. Imaging: Portable chest x-ray showed right lower lobe airspace disease most likely presenting with pneumonia with recommendation for follow-up in 6 to 8 weeks to exclude underlying mass.   The patient was given IV vancomycin  and cefepime  and will advance 5 mg of nebulized albuterol .  She will be admitted to a  progressive unit bed for further evaluation and management.  Assessment and Plan: Acute on chronic respiratory failure with hypoxia (HCC) - This is likely secondary to her right lower lobe pneumonia as well as COPD exacerbation. Patient initially required BiPAP which has been weaned off Continue incentive spirometer Continue current oxygen  requirement to maintain appropriate saturation  manage underlying COPD as well as pneumonia   Sepsis due to pneumonia (HCC) Continue ceftriaxone  and azithromycin  Follow-up on culture results   COPD with acute exacerbation (HCC) Continue steroid therapy Solu-Medrol  have been weaned to prednisone  to complete 5 days course Continue nebulization     Essential hypertension - Will continue antihypertensive therapy.   GERD without esophagitis - We will continue PPI therapy.   Type 2 diabetes mellitus with peripheral neuropathy (HCC) Continue insulin  therapy - Will continue basal coverage. - Will hold off metformin . - Will continue Lyrica .   Dyslipidemia - Will continue statin therapy.   Anxiety and depression - Will continue Xanax  and Lexapro .     DVT prophylaxis: Lovenox .   Advanced Care Planning:  Code Status: full code.   Family Communication: No family at bedside  Disposition Plan: Back to previous home environment   Subjective:  Patient currently requiring 5 L of oxygen  According to her she uses 5 L at home Still having some shortness of breath and unable to walk about as she normally does at home She believes her respiratory function is  improving Denies chest pain, cough improving denies nausea or vomiting  Physical Exam: GENERAL: Elderly female laying in bed on intranasal oxygen  LUNGS: Inspiratory and expiratory wheezing improving CARDIOVASCULAR: Regular rate and rhythm, S1, S2 normal.  ABDOMEN: Soft, nondistended EXTREMITIES: Bilateral lower extremity pitting edema improved NEUROLOGIC: Cranial nerves II through XII are  intact. Muscle strength 5/5 in all extremities. Sensation intact. Gait not checked.  PSYCHIATRIC: The patient is alert and oriented x 3.  Normal affect and good eye contact. SKIN: No obvious rash, lesion, or ulcer.    Vitals:   08/11/24 0400 08/11/24 0536 08/11/24 0825 08/11/24 1201  BP:   (!) 164/86 (!) 150/86  Pulse:   99 90  Resp: 17 17 20 17   Temp:   98.2 F (36.8 C) 98.3 F (36.8 C)  TempSrc:   Oral Oral  SpO2:   93% 93%  Weight:      Height:        Data Reviewed: I have personally reviewed patient chest x-ray showing right-sided infiltrate concerning for pneumonia     Latest Ref Rng & Units 08/11/2024   11:26 AM 08/10/2024    7:37 AM 08/10/2024    3:42 AM  CBC  WBC 4.0 - 10.5 K/uL 12.7  11.1  8.7   Hemoglobin 12.0 - 15.0 g/dL 9.7  89.4  88.6   Hematocrit 36.0 - 46.0 % 32.6  35.3  38.7   Platelets 150 - 400 K/uL 211  210  228        Latest Ref Rng & Units 08/11/2024   11:26 AM 08/10/2024    7:37 AM 08/10/2024    3:42 AM  BMP  Glucose 70 - 99 mg/dL 628  659  752   BUN 8 - 23 mg/dL 20  15  16    Creatinine 0.44 - 1.00 mg/dL 9.39  9.34  9.36   Sodium 135 - 145 mmol/L 137  138  140   Potassium 3.5 - 5.1 mmol/L 4.3  4.6  4.0   Chloride 98 - 111 mmol/L 100  99  100   CO2 22 - 32 mmol/L 31  30  28    Calcium  8.9 - 10.3 mg/dL 8.5  8.6  9.0       Time spent: 51 minutes  Author: Drue ONEIDA Potter, MD 08/11/2024 5:22 PM  For on call review [email or web address].  "

## 2024-08-12 DIAGNOSIS — J9621 Acute and chronic respiratory failure with hypoxia: Secondary | ICD-10-CM | POA: Diagnosis not present

## 2024-08-12 LAB — CBC WITH DIFFERENTIAL/PLATELET
Abs Immature Granulocytes: 0.04 K/uL (ref 0.00–0.07)
Basophils Absolute: 0 K/uL (ref 0.0–0.1)
Basophils Relative: 0 %
Eosinophils Absolute: 0 K/uL (ref 0.0–0.5)
Eosinophils Relative: 0 %
HCT: 32 % — ABNORMAL LOW (ref 36.0–46.0)
Hemoglobin: 9.3 g/dL — ABNORMAL LOW (ref 12.0–15.0)
Immature Granulocytes: 1 %
Lymphocytes Relative: 18 %
Lymphs Abs: 1.5 K/uL (ref 0.7–4.0)
MCH: 26.3 pg (ref 26.0–34.0)
MCHC: 29.1 g/dL — ABNORMAL LOW (ref 30.0–36.0)
MCV: 90.7 fL (ref 80.0–100.0)
Monocytes Absolute: 0.9 K/uL (ref 0.1–1.0)
Monocytes Relative: 10 %
Neutro Abs: 6.3 K/uL (ref 1.7–7.7)
Neutrophils Relative %: 71 %
Platelets: 205 K/uL (ref 150–400)
RBC: 3.53 MIL/uL — ABNORMAL LOW (ref 3.87–5.11)
RDW: 14.5 % (ref 11.5–15.5)
WBC: 8.8 K/uL (ref 4.0–10.5)
nRBC: 0 % (ref 0.0–0.2)

## 2024-08-12 LAB — BASIC METABOLIC PANEL WITH GFR
Anion gap: 6 (ref 5–15)
BUN: 21 mg/dL (ref 8–23)
CO2: 34 mmol/L — ABNORMAL HIGH (ref 22–32)
Calcium: 9.1 mg/dL (ref 8.9–10.3)
Chloride: 101 mmol/L (ref 98–111)
Creatinine, Ser: 0.59 mg/dL (ref 0.44–1.00)
GFR, Estimated: >60 mL/min
Glucose, Bld: 116 mg/dL — ABNORMAL HIGH (ref 70–99)
Potassium: 3.4 mmol/L — ABNORMAL LOW (ref 3.5–5.1)
Sodium: 141 mmol/L (ref 135–145)

## 2024-08-12 LAB — GLUCOSE, CAPILLARY
Glucose-Capillary: 68 mg/dL — ABNORMAL LOW (ref 70–99)
Glucose-Capillary: 168 mg/dL — ABNORMAL HIGH (ref 70–99)
Glucose-Capillary: 166 mg/dL — ABNORMAL HIGH (ref 70–99)
Glucose-Capillary: 239 mg/dL — ABNORMAL HIGH (ref 70–99)
Glucose-Capillary: 286 mg/dL — ABNORMAL HIGH (ref 70–99)
Glucose-Capillary: 288 mg/dL — ABNORMAL HIGH (ref 70–99)

## 2024-08-12 LAB — MRSA NEXT GEN BY PCR, NASAL: MRSA by PCR Next Gen: NOT DETECTED

## 2024-08-12 MED ORDER — POTASSIUM CHLORIDE CRYS ER 20 MEQ PO TBCR
40.0000 meq | EXTENDED_RELEASE_TABLET | Freq: Once | ORAL | Status: AC
  Administered 2024-08-12: 40 meq via ORAL
  Filled 2024-08-12: qty 2

## 2024-08-12 MED ORDER — DM-GUAIFENESIN ER 30-600 MG PO TB12
1.0000 | ORAL_TABLET | Freq: Two times a day (BID) | ORAL | Status: DC
  Administered 2024-08-12 – 2024-08-18 (×14): 1 via ORAL
  Filled 2024-08-12 (×4): qty 1

## 2024-08-12 MED ORDER — AMLODIPINE BESYLATE 5 MG PO TABS
5.0000 mg | ORAL_TABLET | Freq: Every day | ORAL | Status: DC
  Administered 2024-08-12 – 2024-08-18 (×7): 5 mg via ORAL
  Filled 2024-08-12 (×2): qty 1

## 2024-08-12 MED ORDER — IPRATROPIUM-ALBUTEROL 0.5-2.5 (3) MG/3ML IN SOLN
3.0000 mL | Freq: Three times a day (TID) | RESPIRATORY_TRACT | Status: DC
  Administered 2024-08-12 – 2024-08-13 (×3): 3 mL via RESPIRATORY_TRACT
  Filled 2024-08-12 (×2): qty 3

## 2024-08-12 NOTE — Progress Notes (Signed)
 " Progress Note   Patient: Angela Jensen FMW:[id number] DOB: 26-Jul-1956 DOA: 08/10/2024     2 DOS: the patient was seen and examined on 08/12/2024    Brief hospital course: Angela Jensen is a 68 y.o. female with medical history significant for asthma, COPD, osteoarthritis, essential hypertension, CHF, dyslipidemia and chronic respiratory failure on home O2 and OSA, who presented to the emergency room for worsening dyspnea . Patient found to have sepsis due to pneumonia and admitted for further management.   Assessment and Plan: Acute on chronic respiratory failure with hypoxia (HCC) - This is likely secondary to her right lower lobe pneumonia as well as COPD exacerbation. Patient initially required BiPAP which has been weaned off Continue incentive spirometer Continue current oxygen  requirement to maintain appropriate saturation  manage underlying COPD as well as pneumonia   Sepsis due to pneumonia (HCC) Continue ceftriaxone  and azithromycin  Follow-up on culture results   COPD with acute exacerbation (HCC) Continue steroid therapy Solu-Medrol  have been weaned to prednisone  to complete 5 days course Continue nebulization     Essential hypertension - Will continue antihypertensive therapy. Amlodipine  added given uncontrolled blood pressure   GERD without esophagitis - We will continue PPI therapy.   Type 2 diabetes mellitus with peripheral neuropathy (HCC) Continue insulin  therapy - Will continue basal coverage. - Will hold off metformin . - Will continue Lyrica .   Dyslipidemia - Will continue statin therapy.   Anxiety and depression - Will continue Xanax  and Lexapro .     DVT prophylaxis: Lovenox .    Advanced Care Planning:  Code Status: full code.    Family Communication: No family at bedside   Disposition Plan: Back to previous home environment     Subjective:  She tells me her respiratory symptoms are improving although she is not yet at bed baseline Still on 5  L of oxygen  which she uses at home Denies chest pain nausea vomiting   Physical Exam: GENERAL: Elderly female laying in bed on intranasal oxygen  LUNGS: Inspiratory and expiratory wheezing improving CARDIOVASCULAR: Regular rate and rhythm, S1, S2 normal.  ABDOMEN: Soft, nondistended EXTREMITIES: Bilateral lower extremity pitting edema improved NEUROLOGIC: Cranial nerves II through XII are intact. Muscle strength 5/5 in all extremities. Sensation intact. Gait not checked.  PSYCHIATRIC: The patient is alert and oriented x 3.  Normal affect and good eye contact. SKIN: No obvious rash, lesion, or ulcer.    Data reviewed:     Latest Ref Rng & Units 08/12/2024    3:53 AM 08/11/2024   11:26 AM 08/10/2024    7:37 AM  BMP  Glucose 70 - 99 mg/dL 883  628  659   BUN 8 - 23 mg/dL 21  20  15    Creatinine 0.44 - 1.00 mg/dL 9.40  9.39  9.34   Sodium 135 - 145 mmol/L 141  137  138   Potassium 3.5 - 5.1 mmol/L 3.4  4.3  4.6   Chloride 98 - 111 mmol/L 101  100  99   CO2 22 - 32 mmol/L 34  31  30   Calcium  8.9 - 10.3 mg/dL 9.1  8.5  8.6      Vitals:   08/12/24 0411 08/12/24 0805 08/12/24 1132 08/12/24 1605  BP: (!) 134/102 (!) 166/101 (!) 152/87 134/88  Pulse: 84 80 87 86  Resp: 18 20 20 18   Temp: 98.6 F (37 C) 98.6 F (37 C) 98.7 F (37.1 C) 98.4 F (36.9 C)  TempSrc: Oral  SpO2: 97% 95% 97% 99%  Weight:      Height:          Latest Ref Rng & Units 08/12/2024    3:53 AM 08/11/2024   11:26 AM 08/10/2024    7:37 AM  CBC  WBC 4.0 - 10.5 K/uL 8.8  12.7  11.1   Hemoglobin 12.0 - 15.0 g/dL 9.3  9.7  89.4   Hematocrit 36.0 - 46.0 % 32.0  32.6  35.3   Platelets 150 - 400 K/uL 205  211  210      Author: Drue ONEIDA Potter, MD 08/12/2024 5:42 PM  For on call review [email or web address].  "

## 2024-08-12 NOTE — Progress Notes (Signed)
 OT Cancellation Note  Patient Details Name: NAZIFA TRINKA MRN: [id number] DOB: Mar 23, 1956   Cancelled Treatment:    Reason Eval/Treat Not Completed: OT screened, no needs identified, will sign off. Order received, chart reviewed. On arrival pt finishing with PT, reports back to baseline functional independence. Reviewed energy conservation strategies with pt demonstrating good recall and understanding. No skilled OT needs identified. Will sign off. Please re-consult if additional needs arise.   Elston Slot, M.S. OTR/L  08/12/2024, 1:34 PM  ascom [phone]

## 2024-08-12 NOTE — Plan of Care (Signed)

## 2024-08-12 NOTE — Evaluation (Signed)
 Physical Therapy Evaluation Patient Details Name: Angela Jensen MRN: [id number] DOB: [birth year] Today's Date: 08/12/2024  History of Present Illness  Pt is a 68 y.o. female with medical history significant for asthma, COPD, osteoarthritis, essential hypertension, CHF, dyslipidemia and chronic respiratory failure on home O2 and OSA, who presented to the emergency room for worsening dyspnea. MD assessment includes: acute on chronic respiratory failure with hypoxia, sepsis due to PNA, and COPD exacerbation.   Clinical Impression  Pt was pleasant and motivated to participate during the session and put forth good effort throughout. Pt found on 5LO2/min with SpO2 in the low to mid 90s.  Pt Ind with all functional tasks per below demonstrating good eccentric and concentric control and stability with transfers from multiple surfaces and good stability with gait without an AD including with dynamic gait tasks.  Pt's SpO2 dropped to a low of 88% with amb and quickly increased back to the low 90s once pt returned to sitting.  Of note, amb performed on 6L due to O2 tank skipping from 4L to 6L, nursing notified of above results.  No skilled PT needs identified at this time.  Will complete PT orders but will reassess pt pending a change in status upon receipt of new PT orders.          If plan is discharge home, recommend the following: Assist for transportation   Can travel by private vehicle        Equipment Recommendations None recommended by PT  Recommendations for Other Services       Functional Status Assessment       Precautions / Restrictions Precautions Precautions: None Restrictions Weight Bearing Restrictions Per Provider Order: No      Mobility  Bed Mobility Overal bed mobility: Independent                  Transfers Overall transfer level: Independent                 General transfer comment: Good eccentric and concentric control and stability from multiple  surfaces    Ambulation/Gait Ambulation/Gait assistance: Independent Gait Distance (Feet): 50 Feet Assistive device: None Gait Pattern/deviations: Step-through pattern, Decreased step length - right, Decreased step length - left Gait velocity: decreased     General Gait Details: Min reduced cadence but steady without overt LOB including multiple start/stops and 180 deg turns  Careers Information Officer     Tilt Bed    Modified Rankin (Stroke Patients Only)       Balance Overall balance assessment: No apparent balance deficits (not formally assessed)                                           Pertinent Vitals/Pain Pain Assessment Pain Assessment: 0-10 Pain Score: 7  Pain Location: Chronic LLE pain Pain Descriptors / Indicators: Sore Pain Intervention(s): Premedicated before session, Repositioned, Monitored during session    Home Living Family/patient expects to be discharged to:: Private residence Living Arrangements: Spouse/significant other Available Help at Discharge: Family;Available 24 hours/day Type of Home: House Home Access: Stairs to enter Entrance Stairs-Rails: Doctor, General Practice of Steps: 1   Home Layout: Laundry or work area in basement;Two level;Able to live on main level with bedroom/bathroom Home Equipment: Rollator (4 wheels);Cane - single point;Shower seat;BSC/3in1;Rolling Environmental Consultant (2 wheels) Additional  Comments: uses 5LO2/min at home at all times    Prior Function Prior Level of Function : Independent/Modified Independent;Driving             Mobility Comments: Ind amb in the home mostly without an AD, uses a rollator PRN mostly to sit on while working in the kitchen, no fall history ADLs Comments: Independent with bathing & dressing.     Extremity/Trunk Assessment   Upper Extremity Assessment Upper Extremity Assessment: Overall WFL for tasks assessed    Lower Extremity Assessment Lower  Extremity Assessment: Overall WFL for tasks assessed       Communication   Communication Communication: No apparent difficulties    Cognition Arousal: Alert Behavior During Therapy: WFL for tasks assessed/performed   PT - Cognitive impairments: No apparent impairments                         Following commands: Intact       Cueing Cueing Techniques: Verbal cues     General Comments      Exercises     Assessment/Plan    PT Assessment Patient does not need any further PT services  PT Problem List         PT Treatment Interventions      PT Goals (Current goals can be found in the Care Plan section)  Acute Rehab PT Goals PT Goal Formulation: All assessment and education complete, DC therapy    Frequency       Co-evaluation               AM-PAC PT 6 Clicks Mobility  Outcome Measure Help needed turning from your back to your side while in a flat bed without using bedrails?: None Help needed moving from lying on your back to sitting on the side of a flat bed without using bedrails?: None Help needed moving to and from a bed to a chair (including a wheelchair)?: None Help needed standing up from a chair using your arms (e.g., wheelchair or bedside chair)?: None Help needed to walk in hospital room?: None Help needed climbing 3-5 steps with a railing? : None 6 Click Score: 24    End of Session Equipment Utilized During Treatment: Gait belt;Oxygen  Activity Tolerance: Patient tolerated treatment well Patient left: Other (comment);in chair (Pt left with OT)   PT Visit Diagnosis: Difficulty in walking, not elsewhere classified (R26.2)    Time: 8697-8672 PT Time Calculation (min) (ACUTE ONLY): 25 min   Charges:   PT Evaluation $PT Eval Low Complexity: 1 Low   PT General Charges $$ ACUTE PT VISIT: 1 Visit       D. Glendia Bertin PT, DPT 08/12/2024, 2:40 PM

## 2024-08-13 DIAGNOSIS — J9621 Acute and chronic respiratory failure with hypoxia: Secondary | ICD-10-CM | POA: Diagnosis not present

## 2024-08-13 LAB — CBC WITH DIFFERENTIAL/PLATELET
Abs Immature Granulocytes: 0.04 K/uL (ref 0.00–0.07)
Basophils Absolute: 0 K/uL (ref 0.0–0.1)
Basophils Relative: 0 %
Eosinophils Absolute: 0 K/uL (ref 0.0–0.5)
Eosinophils Relative: 0 %
HCT: 32.9 % — ABNORMAL LOW (ref 36.0–46.0)
Hemoglobin: 9.5 g/dL — ABNORMAL LOW (ref 12.0–15.0)
Immature Granulocytes: 1 %
Lymphocytes Relative: 15 %
Lymphs Abs: 1.1 K/uL (ref 0.7–4.0)
MCH: 26.5 pg (ref 26.0–34.0)
MCHC: 28.9 g/dL — ABNORMAL LOW (ref 30.0–36.0)
MCV: 91.6 fL (ref 80.0–100.0)
Monocytes Absolute: 0.9 K/uL (ref 0.1–1.0)
Monocytes Relative: 12 %
Neutro Abs: 5.6 K/uL (ref 1.7–7.7)
Neutrophils Relative %: 72 %
Platelets: 215 K/uL (ref 150–400)
RBC: 3.59 MIL/uL — ABNORMAL LOW (ref 3.87–5.11)
RDW: 14.2 % (ref 11.5–15.5)
WBC: 7.7 K/uL (ref 4.0–10.5)
nRBC: 0 % (ref 0.0–0.2)

## 2024-08-13 LAB — BASIC METABOLIC PANEL WITH GFR
Anion gap: 5 (ref 5–15)
BUN: 21 mg/dL (ref 8–23)
CO2: 33 mmol/L — ABNORMAL HIGH (ref 22–32)
Calcium: 9.3 mg/dL (ref 8.9–10.3)
Chloride: 104 mmol/L (ref 98–111)
Creatinine, Ser: 0.5 mg/dL (ref 0.44–1.00)
GFR, Estimated: >60 mL/min
Glucose, Bld: 156 mg/dL — ABNORMAL HIGH (ref 70–99)
Potassium: 4.4 mmol/L (ref 3.5–5.1)
Sodium: 142 mmol/L (ref 135–145)

## 2024-08-13 LAB — GLUCOSE, CAPILLARY
Glucose-Capillary: 220 mg/dL — ABNORMAL HIGH (ref 70–99)
Glucose-Capillary: 312 mg/dL — ABNORMAL HIGH (ref 70–99)
Glucose-Capillary: 319 mg/dL — ABNORMAL HIGH (ref 70–99)
Glucose-Capillary: 381 mg/dL — ABNORMAL HIGH (ref 70–99)
Glucose-Capillary: 73 mg/dL (ref 70–99)

## 2024-08-13 MED ORDER — METHYLPREDNISOLONE SODIUM SUCC 40 MG IJ SOLR
40.0000 mg | Freq: Every day | INTRAMUSCULAR | Status: DC
  Administered 2024-08-13 – 2024-08-15 (×3): 40 mg via INTRAVENOUS
  Filled 2024-08-13: qty 1

## 2024-08-13 NOTE — Progress Notes (Signed)
 Mobility Specialist - Progress Note  Pre-mobility:  SpO2-93%  During mobility:  SpO2-88% recovered to >89 in less than  Post-mobility:  SPO2- 91%   08/13/24 0907  Therapy Vitals  Temp 98.7 F (37.1 C)  Pulse Rate 87  BP (!) 143/73  Patient Position (if appropriate) Lying  Oxygen  Therapy  SpO2 93 %  O2 Device Nasal Cannula  Mobility  Activity Ambulated with assistance;Stood at bedside;Dangled on edge of bed  Level of Assistance Standby assist, set-up cues, supervision of patient - no hands on  Assistive Device Other (Comment) (IV Stand)  Distance Ambulated (ft) 40 ft  Range of Motion/Exercises Active  Activity Response Tolerated well  Mobility visit 1 Mobility  Mobility Specialist Start Time (ACUTE ONLY) 0914  Mobility Specialist Stop Time (ACUTE ONLY) 0929  Mobility Specialist Time Calculation (min) (ACUTE ONLY) 15 min   Pt was supine in bed with the HOB elevated on O@ @ 4 L. Pt agreed to mobility. Pt is able today to get to the EOB independently with bed features. Pt is able today to STS independently. Pt O2 vitals were taken throughout activity. Pt is receiving fluids through IV Stand. Pt ambulated well within the room. Pt did use recovery break to check vitals. Pt did Desat, but recovered in less than 2 min. After activity pt returned to bed with needs in reach.  Clem Rodes Mobility Specialist 08/13/2024, 10:47 AM

## 2024-08-13 NOTE — Plan of Care (Signed)
  Problem: Respiratory: Goal: Ability to maintain adequate ventilation will improve Outcome: Progressing   Problem: Education: Goal: Knowledge of General Education information will improve Description: Including pain rating scale, medication(s)/side effects and non-pharmacologic comfort measures Outcome: Progressing   Problem: Activity: Goal: Risk for activity intolerance will decrease Outcome: Progressing   Problem: Nutrition: Goal: Adequate nutrition will be maintained Outcome: Progressing   Problem: Coping: Goal: Level of anxiety will decrease Outcome: Progressing   Problem: Pain Managment: Goal: General experience of comfort will improve and/or be controlled Outcome: Progressing   Problem: Safety: Goal: Ability to remain free from injury will improve Outcome: Progressing

## 2024-08-13 NOTE — Progress Notes (Signed)
 " PROGRESS NOTE    Angela Jensen  FMW:[id number] DOB: 10/25/1955 DOA: 08/10/2024 PCP: Liana Fish, NP    Brief Narrative:   Angela Jensen is a 68 y.o. female with medical history significant for asthma, COPD, osteoarthritis, essential hypertension, CHF, dyslipidemia and chronic respiratory failure on home O2 and OSA, who presented to the emergency room for worsening dyspnea . Patient found to have sepsis due to pneumonia and admitted for further management.   Assessment & Plan:   Principal Problem:   Acute on chronic respiratory failure with hypoxia (HCC) Active Problems:   Sepsis due to pneumonia (HCC)   COPD with acute exacerbation (HCC)   Anxiety and depression   Dyslipidemia   Type 2 diabetes mellitus with peripheral neuropathy (HCC)   GERD without esophagitis   Essential hypertension  Acute on chronic respiratory failure with hypoxia (HCC) - This is likely secondary to her right lower lobe pneumonia as well as COPD exacerbation. Patient initially required BiPAP which has been weaned off Plan: Continue incentive spirometer Continue current oxygen  requirement to maintain appropriate saturation  manage underlying COPD as well as pneumonia   Sepsis due to pneumonia (HCC) Continue ceftriaxone  and azithromycin  Follow-up on culture results, no growth to date   COPD with acute exacerbation (HCC) Continue steroid therapy Solu-Medrol  have been weaned to prednisone  to complete 5 days course Continue nebulization Wean oxygen  as tolerated     Essential hypertension Better controlled Continue current regimen   GERD without esophagitis PPI   Type 2 diabetes mellitus with peripheral neuropathy (HCC) Basal bolus regimen Lyrica  Hold metformin    Dyslipidemia PTA statin   Anxiety and depression Home Xanax  and Lexapro     DVT prophylaxis: SQ Lovenox  Code Status: Full Family Communication: None today Disposition Plan: Status is: Inpatient Remains inpatient  appropriate because: COPD   Level of care: Progressive  Consultants:  None  Procedures:  None  Antimicrobials: Ceftriaxone  Azithromycin    Subjective: Seen and examined.  Reports respiratory status is improving but not yet at baseline.  Continues shortness of breath and cough.  Objective: Vitals:   08/13/24 0318 08/13/24 0741 08/13/24 0907 08/13/24 1238  BP: (!) 144/95  (!) 143/73 131/78  Pulse: 81  87 85  Resp: 19   18  Temp: 97.6 F (36.4 C)  98.7 F (37.1 C) 97.8 F (36.6 C)  TempSrc:      SpO2: 94% 95% 93% 92%  Weight:      Height:        Intake/Output Summary (Last 24 hours) at 08/13/2024 1459 Last data filed at 08/13/2024 1100 Gross per 24 hour  Intake 817.97 ml  Output 600 ml  Net 217.97 ml   Filed Weights   08/10/24 0328  Weight: 54.8 kg    Examination:  General exam: Appears fatigued Respiratory system: Diminished air entry.  Bibasilar wheeze.  Normal work of breathing.  4 L Cardiovascular system: S2, RRR, no murmurs, no pedal edema Gastrointestinal system: Soft, NT/ND, normal bowel sounds Central nervous system: Alert and oriented. No focal neurological deficits. Extremities: Symmetric 5 x 5 power. Skin: No rashes, lesions or ulcers Psychiatry: Judgement and insight appear normal. Mood & affect appropriate.     Data Reviewed: I have personally reviewed following labs and imaging studies  CBC: Recent Labs  Lab 08/10/24 0342 08/10/24 0737 08/11/24 1126 08/12/24 0353 08/13/24 0410  WBC 8.7 11.1* 12.7* 8.8 7.7  NEUTROABS 6.9  --  11.7* 6.3 5.6  HGB 11.3* 10.5* 9.7* 9.3* 9.5*  HCT  38.7 35.3* 32.6* 32.0* 32.9*  MCV 90.2 90.5 88.6 90.7 91.6  PLT 228 210 211 205 215   Basic Metabolic Panel: Recent Labs  Lab 08/10/24 0342 08/10/24 0737 08/11/24 1126 08/12/24 0353 08/13/24 0410  NA 140 138 137 141 142  K 4.0 4.6 4.3 3.4* 4.4  CL 100 99 100 101 104  CO2 28 30 31  34* 33*  GLUCOSE 247* 340* 371* 116* 156*  BUN 16 15 20 21 21    CREATININE 0.63 0.65 0.60 0.59 0.50  CALCIUM  9.0 8.6* 8.5* 9.1 9.3   GFR: Estimated Creatinine Clearance: 50.8 mL/min (by C-G formula based on SCr of 0.5 mg/dL). Liver Function Tests: Recent Labs  Lab 08/10/24 0342  AST 31  ALT 20  ALKPHOS 61  BILITOT 0.4  PROT 7.0  ALBUMIN 4.3   No results for input(s): LIPASE, AMYLASE in the last 168 hours. No results for input(s): AMMONIA in the last 168 hours. Coagulation Profile: Recent Labs  Lab 08/10/24 0737  INR 1.0   Cardiac Enzymes: No results for input(s): CKTOTAL, CKMB, CKMBINDEX, TROPONINI in the last 168 hours. BNP (last 3 results) Recent Labs    08/10/24 0342  PROBNP 196.0   HbA1C: Recent Labs    08/11/24 0924  HGBA1C 11.0*   CBG: Recent Labs  Lab 08/12/24 1607 08/12/24 2009 08/12/24 2146 08/13/24 0908 08/13/24 1234  GLUCAP 239* 286* 288* 220* 73   Lipid Profile: No results for input(s): CHOL, HDL, LDLCALC, TRIG, CHOLHDL, LDLDIRECT in the last 72 hours. Thyroid  Function Tests: No results for input(s): TSH, T4TOTAL, FREET4, T3FREE, THYROIDAB in the last 72 hours. Anemia Panel: No results for input(s): VITAMINB12, FOLATE, FERRITIN, TIBC, IRON , RETICCTPCT in the last 72 hours. Sepsis Labs: Recent Labs  Lab 08/10/24 0342  LATICACIDVEN 1.5    Recent Results (from the past 240 hours)  Resp panel by RT-PCR (RSV, Flu A&B, Covid) Anterior Nasal Swab     Status: None   Collection Time: 08/10/24  3:42 AM   Specimen: Anterior Nasal Swab  Result Value Ref Range Status   SARS Coronavirus 2 by RT PCR NEGATIVE NEGATIVE Final    Comment: (NOTE) SARS-CoV-2 target nucleic acids are NOT DETECTED.  The SARS-CoV-2 RNA is generally detectable in upper respiratory specimens during the acute phase of infection. The lowest concentration of SARS-CoV-2 viral copies this assay can detect is 138 copies/mL. A negative result does not preclude SARS-Cov-2 infection and should  not be used as the sole basis for treatment or other patient management decisions. A negative result may occur with  improper specimen collection/handling, submission of specimen other than nasopharyngeal swab, presence of viral mutation(s) within the areas targeted by this assay, and inadequate number of viral copies(<138 copies/mL). A negative result must be combined with clinical observations, patient history, and epidemiological information. The expected result is Negative.  Fact Sheet for Patients:  bloggercourse.com  Fact Sheet for Healthcare Providers:  seriousbroker.it  This test is no t yet approved or cleared by the [place]  FDA and  has been authorized for detection and/or diagnosis of SARS-CoV-2 by FDA under an Emergency Use Authorization (EUA). This EUA will remain  in effect (meaning this test can be used) for the duration of the COVID-19 declaration under Section 564(b)(1) of the Act, 21 U.S.C.section 360bbb-3(b)(1), unless the authorization is terminated  or revoked sooner.       Influenza A by PCR NEGATIVE NEGATIVE Final   Influenza B by PCR NEGATIVE NEGATIVE Final    Comment: (NOTE)  The Xpert Xpress SARS-CoV-2/FLU/RSV plus assay is intended as an aid in the diagnosis of influenza from Nasopharyngeal swab specimens and should not be used as a sole basis for treatment. Nasal washings and aspirates are unacceptable for Xpert Xpress SARS-CoV-2/FLU/RSV testing.  Fact Sheet for Patients: bloggercourse.com  Fact Sheet for Healthcare Providers: seriousbroker.it  This test is not yet approved or cleared by the [place]  FDA and has been authorized for detection and/or diagnosis of SARS-CoV-2 by FDA under an Emergency Use Authorization (EUA). This EUA will remain in effect (meaning this test can be used) for the duration of the COVID-19 declaration under  Section 564(b)(1) of the Act, 21 U.S.C. section 360bbb-3(b)(1), unless the authorization is terminated or revoked.     Resp Syncytial Virus by PCR NEGATIVE NEGATIVE Final    Comment: (NOTE) Fact Sheet for Patients: bloggercourse.com  Fact Sheet for Healthcare Providers: seriousbroker.it  This test is not yet approved or cleared by the [place]  FDA and has been authorized for detection and/or diagnosis of SARS-CoV-2 by FDA under an Emergency Use Authorization (EUA). This EUA will remain in effect (meaning this test can be used) for the duration of the COVID-19 declaration under Section 564(b)(1) of the Act, 21 U.S.C. section 360bbb-3(b)(1), unless the authorization is terminated or revoked.  Performed at [facility], [address] [place], [place] [address]   Blood Culture (routine x 2)     Status: None (Preliminary result)   Collection Time: 08/10/24  4:36 AM   Specimen: BLOOD  Result Value Ref Range Status   Specimen Description BLOOD BLOOD RIGHT ARM  Final   Special Requests   Final    BOTTLES DRAWN AEROBIC AND ANAEROBIC Blood Culture adequate volume   Culture   Final    NO GROWTH 3 DAYS Performed at [facility], [address] [place], [place] [address]    Report Status PENDING  Incomplete  Blood Culture (routine x 2)     Status: None (Preliminary result)   Collection Time: 08/10/24  4:36 AM   Specimen: BLOOD  Result Value Ref Range Status   Specimen Description BLOOD BLOOD LEFT ARM  Final   Special Requests   Final    BOTTLES DRAWN AEROBIC AND ANAEROBIC Blood Culture adequate volume   Culture   Final    NO GROWTH 3 DAYS Performed at [facility], [address] [place], [place] [address]    Report Status PENDING  Incomplete  MRSA Next Gen by PCR, Nasal     Status: None   Collection Time: 08/12/24  8:21 AM   Specimen: Nasal Mucosa; Nasal Swab  Result Value Ref Range Status    MRSA by PCR Next Gen NOT DETECTED NOT DETECTED Final    Comment: (NOTE) The GeneXpert MRSA Assay (FDA approved for NASAL specimens only), is one component of a comprehensive MRSA colonization surveillance program. It is not intended to diagnose MRSA infection nor to guide or monitor treatment for MRSA infections. Test performance is not FDA approved in patients less than 40 years old. Performed at [facility], [address] [place], [place] [address]          Radiology Studies: No results found.      Scheduled Meds:  amLODipine   5 mg Oral Daily   atorvastatin   10 mg Oral Daily   calcium  carbonate  500 mg of elemental calcium  Oral BID WC   cholecalciferol   1,000 Units Oral Daily   clopidogrel   75 mg  Oral Daily   dextromethorphan -guaiFENesin   1 tablet Oral BID   enoxaparin  (LOVENOX ) injection  40 mg Subcutaneous Q24H   escitalopram   10 mg Oral Daily   ferrous sulfate   325 mg Oral Q breakfast   insulin  aspart  0-5 Units Subcutaneous QHS   insulin  aspart  0-9 Units Subcutaneous TID WC   insulin  glargine-yfgn  15 Units Subcutaneous BID   losartan   50 mg Oral Daily   methylPREDNISolone  (SOLU-MEDROL ) injection  40 mg Intravenous Daily   metoprolol  succinate  50 mg Oral Daily   pantoprazole   40 mg Oral Daily   potassium chloride  SA  10 mEq Oral QODAY   pregabalin   75 mg Oral TID   vitamin B-12  100 mcg Oral Daily   Continuous Infusions:  azithromycin  500 mg (08/13/24 0958)   cefTRIAXone  (ROCEPHIN )  IV Stopped (08/12/24 1917)     LOS: 3 days     Calvin KATHEE Robson, MD [facility]   If 7PM-7AM, please contact night-coverage  08/13/2024, 2:59 PM   "

## 2024-08-13 NOTE — Plan of Care (Signed)

## 2024-08-14 ENCOUNTER — Encounter

## 2024-08-14 DIAGNOSIS — J9621 Acute and chronic respiratory failure with hypoxia: Secondary | ICD-10-CM | POA: Diagnosis not present

## 2024-08-14 LAB — GLUCOSE, CAPILLARY
Glucose-Capillary: 82 mg/dL (ref 70–99)
Glucose-Capillary: 131 mg/dL — ABNORMAL HIGH (ref 70–99)
Glucose-Capillary: 248 mg/dL — ABNORMAL HIGH (ref 70–99)
Glucose-Capillary: 337 mg/dL — ABNORMAL HIGH (ref 70–99)

## 2024-08-14 MED ORDER — INSULIN GLARGINE-YFGN 100 UNIT/ML ~~LOC~~ SOLN
12.0000 [IU] | Freq: Two times a day (BID) | SUBCUTANEOUS | Status: DC
  Administered 2024-08-14 – 2024-08-16 (×3): 12 [IU] via SUBCUTANEOUS
  Filled 2024-08-14 (×5): qty 0.12

## 2024-08-14 MED ORDER — INSULIN ASPART 100 UNIT/ML IJ SOLN
0.0000 [IU] | INTRAMUSCULAR | Status: DC
  Administered 2024-08-14: 3 [IU] via SUBCUTANEOUS
  Administered 2024-08-14 – 2024-08-15 (×2): 4 [IU] via SUBCUTANEOUS
  Administered 2024-08-15 (×2): 1 [IU] via SUBCUTANEOUS
  Administered 2024-08-15 – 2024-08-16 (×2): 3 [IU] via SUBCUTANEOUS
  Administered 2024-08-16: 1 [IU] via SUBCUTANEOUS
  Administered 2024-08-17: 3 [IU] via SUBCUTANEOUS
  Filled 2024-08-14: qty 4
  Filled 2024-08-14: qty 2
  Filled 2024-08-14 (×2): qty 1
  Filled 2024-08-14: qty 3
  Filled 2024-08-14: qty 1
  Filled 2024-08-14: qty 4
  Filled 2024-08-14 (×2): qty 3
  Filled 2024-08-14: qty 1

## 2024-08-14 MED ORDER — INSULIN ASPART 100 UNIT/ML IJ SOLN
3.0000 [IU] | Freq: Three times a day (TID) | INTRAMUSCULAR | Status: DC
  Administered 2024-08-14 – 2024-08-18 (×7): 3 [IU] via SUBCUTANEOUS
  Filled 2024-08-14 (×7): qty 3

## 2024-08-14 NOTE — Inpatient Diabetes Management (Signed)
 Inpatient Diabetes Program Recommendations  AACE/ADA: New Consensus Statement on Inpatient Glycemic Control (2015)  Target Ranges:  Prepandial:   less than 140 mg/dL      Peak postprandial:   less than 180 mg/dL (1-2 hours)      Critically ill patients:  140 - 180 mg/dL   Lab Results  Component Value Date   GLUCAP 82 08/14/2024   HGBA1C 11.0 (H) 08/11/2024    Latest Reference Range & Units 08/13/24 09:08 08/13/24 12:34 08/13/24 15:56 08/13/24 21:59 08/13/24 23:32 08/14/24 07:54  Glucose-Capillary 70 - 99 mg/dL 779 (H) 73 680 (H) 618 (H) 312 (H) 82  (H): Data is abnormally high  Diabetes history: DM2 Outpatient Diabetes medications:  Toujeo  10 units daily + Metformin  1000 mg daily  Current orders for Inpatient glycemic control: Semglee  15 units BID + Novolog  0-9 ac/hs + Solumedrol 40 mg daily   Inpatient Diabetes Program Recommendations:   Noted Solumedrol decreased by 50% on 08/13/24. Fasting CBG 82 this am. Please consider while on steroids: -Decrease Lantus  to 12 units bid. -Change Novolog  correction to 0-6 units q 4 hrs.  -Add Novolog  3 units tid meal coverage   Thank you, Siraj Dermody E. Rochell Puett, RN, MSN, CNS, CDCES  Diabetes Coordinator Inpatient Glycemic Control Team Team Pager [phone] (8am-5pm) 08/14/2024 10:40 AM

## 2024-08-14 NOTE — Plan of Care (Signed)

## 2024-08-14 NOTE — Care Management Important Message (Signed)
 Important Message  Patient Details  Name: Angela Jensen MRN: [id number] Date of Birth: 1956/07/29   Important Message Given:  Yes - Medicare IM     [name] 08/14/2024, 1:54 PM

## 2024-08-14 NOTE — Progress Notes (Signed)
 " PROGRESS NOTE    Angela Jensen  FMW:[id number] DOB: 15-Aug-1956 DOA: 08/10/2024 PCP: Liana Fish, NP    Brief Narrative:   Angela Jensen is a 68 y.o. female with medical history significant for asthma, COPD, osteoarthritis, essential hypertension, CHF, dyslipidemia and chronic respiratory failure on home O2 and OSA, who presented to the emergency room for worsening dyspnea . Patient found to have sepsis due to pneumonia and admitted for further management.   Assessment & Plan:   Principal Problem:   Acute on chronic respiratory failure with hypoxia (HCC) Active Problems:   Sepsis due to pneumonia (HCC)   COPD with acute exacerbation (HCC)   Anxiety and depression   Dyslipidemia   Type 2 diabetes mellitus with peripheral neuropathy (HCC)   GERD without esophagitis   Essential hypertension  Acute on chronic respiratory failure with hypoxia (HCC) - This is likely secondary to her right lower lobe pneumonia as well as COPD exacerbation. Patient initially required BiPAP which has been weaned off Plan: Continue incentive spirometer Continue current oxygen  requirement to maintain appropriate saturation  manage underlying COPD as well as pneumonia   Sepsis due to pneumonia (HCC) Continue ceftriaxone  and azithromycin  Follow-up on culture results, no growth to date   COPD with acute exacerbation (HCC) Continue p.o. prednisone  Continue bronchodilator regimen Wean oxygen  as tolerated Anticipate discharge 12/30   Essential hypertension Better controlled Continue current regimen   GERD without esophagitis PPI   Type 2 diabetes mellitus with peripheral neuropathy (HCC) Basal bolus regimen Lyrica  Hold metformin    Dyslipidemia PTA statin   Anxiety and depression Home Xanax  and Lexapro     DVT prophylaxis: SQ Lovenox  Code Status: Full Family Communication: None today Disposition Plan: Status is: Inpatient Remains inpatient appropriate because: COPD   Level of  care: Progressive  Consultants:  None  Procedures:  None  Antimicrobials: Ceftriaxone  Azithromycin    Subjective: Seen and examined.  Respiratory status improving.  Not yet at baseline.  Continues to endorse cough and shortness of breath.  Objective: Vitals:   08/14/24 0400 08/14/24 0405 08/14/24 0755 08/14/24 1202  BP:  99/79 (!) 143/81 (P) 117/80  Pulse:  81 83 (P) 82  Resp: 11 17 16    Temp:  (!) 97.3 F (36.3 C) 98.3 F (36.8 C) (P) 98.5 F (36.9 C)  TempSrc:    (P) Oral  SpO2:  95% 92% (P) 91%  Weight:      Height:        Intake/Output Summary (Last 24 hours) at 08/14/2024 1344 Last data filed at 08/14/2024 0944 Gross per 24 hour  Intake 1186.15 ml  Output --  Net 1186.15 ml   Filed Weights   08/10/24 0328  Weight: 54.8 kg    Examination:  General exam: No acute distress.  Appears fatigued Respiratory system: Air entry diminished.  No wheeze.  Normal breathing.  4 L Cardiovascular system: S2, RRR, no murmurs, no pedal edema Gastrointestinal system: Soft, NT/ND, normal bowel sounds Central nervous system: Alert and oriented. No focal neurological deficits. Extremities: Symmetric 5 x 5 power. Skin: No rashes, lesions or ulcers Psychiatry: Judgement and insight appear normal. Mood & affect appropriate.     Data Reviewed: I have personally reviewed following labs and imaging studies  CBC: Recent Labs  Lab 08/10/24 0342 08/10/24 0737 08/11/24 1126 08/12/24 0353 08/13/24 0410  WBC 8.7 11.1* 12.7* 8.8 7.7  NEUTROABS 6.9  --  11.7* 6.3 5.6  HGB 11.3* 10.5* 9.7* 9.3* 9.5*  HCT 38.7 35.3* 32.6*  32.0* 32.9*  MCV 90.2 90.5 88.6 90.7 91.6  PLT 228 210 211 205 215   Basic Metabolic Panel: Recent Labs  Lab 08/10/24 0342 08/10/24 0737 08/11/24 1126 08/12/24 0353 08/13/24 0410  NA 140 138 137 141 142  K 4.0 4.6 4.3 3.4* 4.4  CL 100 99 100 101 104  CO2 28 30 31  34* 33*  GLUCOSE 247* 340* 371* 116* 156*  BUN 16 15 20 21 21   CREATININE 0.63 0.65  0.60 0.59 0.50  CALCIUM  9.0 8.6* 8.5* 9.1 9.3   GFR: Estimated Creatinine Clearance: 50.8 mL/min (by C-G formula based on SCr of 0.5 mg/dL). Liver Function Tests: Recent Labs  Lab 08/10/24 0342  AST 31  ALT 20  ALKPHOS 61  BILITOT 0.4  PROT 7.0  ALBUMIN 4.3   No results for input(s): LIPASE, AMYLASE in the last 168 hours. No results for input(s): AMMONIA in the last 168 hours. Coagulation Profile: Recent Labs  Lab 08/10/24 0737  INR 1.0   Cardiac Enzymes: No results for input(s): CKTOTAL, CKMB, CKMBINDEX, TROPONINI in the last 168 hours. BNP (last 3 results) Recent Labs    08/10/24 0342  PROBNP 196.0   HbA1C: No results for input(s): HGBA1C in the last 72 hours.  CBG: Recent Labs  Lab 08/13/24 1556 08/13/24 2159 08/13/24 2332 08/14/24 0754 08/14/24 1201  GLUCAP 319* 381* 312* 82 131*   Lipid Profile: No results for input(s): CHOL, HDL, LDLCALC, TRIG, CHOLHDL, LDLDIRECT in the last 72 hours. Thyroid  Function Tests: No results for input(s): TSH, T4TOTAL, FREET4, T3FREE, THYROIDAB in the last 72 hours. Anemia Panel: No results for input(s): VITAMINB12, FOLATE, FERRITIN, TIBC, IRON , RETICCTPCT in the last 72 hours. Sepsis Labs: Recent Labs  Lab 08/10/24 0342  LATICACIDVEN 1.5    Recent Results (from the past 240 hours)  Resp panel by RT-PCR (RSV, Flu A&B, Covid) Anterior Nasal Swab     Status: None   Collection Time: 08/10/24  3:42 AM   Specimen: Anterior Nasal Swab  Result Value Ref Range Status   SARS Coronavirus 2 by RT PCR NEGATIVE NEGATIVE Final    Comment: (NOTE) SARS-CoV-2 target nucleic acids are NOT DETECTED.  The SARS-CoV-2 RNA is generally detectable in upper respiratory specimens during the acute phase of infection. The lowest concentration of SARS-CoV-2 viral copies this assay can detect is 138 copies/mL. A negative result does not preclude SARS-Cov-2 infection and should not be used as  the sole basis for treatment or other patient management decisions. A negative result may occur with  improper specimen collection/handling, submission of specimen other than nasopharyngeal swab, presence of viral mutation(s) within the areas targeted by this assay, and inadequate number of viral copies(<138 copies/mL). A negative result must be combined with clinical observations, patient history, and epidemiological information. The expected result is Negative.  Fact Sheet for Patients:  bloggercourse.com  Fact Sheet for Healthcare Providers:  seriousbroker.it  This test is no t yet approved or cleared by the [place]  FDA and  has been authorized for detection and/or diagnosis of SARS-CoV-2 by FDA under an Emergency Use Authorization (EUA). This EUA will remain  in effect (meaning this test can be used) for the duration of the COVID-19 declaration under Section 564(b)(1) of the Act, 21 U.S.C.section 360bbb-3(b)(1), unless the authorization is terminated  or revoked sooner.       Influenza A by PCR NEGATIVE NEGATIVE Final   Influenza B by PCR NEGATIVE NEGATIVE Final    Comment: (NOTE) The Xpert Xpress SARS-CoV-2/FLU/RSV  plus assay is intended as an aid in the diagnosis of influenza from Nasopharyngeal swab specimens and should not be used as a sole basis for treatment. Nasal washings and aspirates are unacceptable for Xpert Xpress SARS-CoV-2/FLU/RSV testing.  Fact Sheet for Patients: bloggercourse.com  Fact Sheet for Healthcare Providers: seriousbroker.it  This test is not yet approved or cleared by the [place]  FDA and has been authorized for detection and/or diagnosis of SARS-CoV-2 by FDA under an Emergency Use Authorization (EUA). This EUA will remain in effect (meaning this test can be used) for the duration of the COVID-19 declaration under Section 564(b)(1) of  the Act, 21 U.S.C. section 360bbb-3(b)(1), unless the authorization is terminated or revoked.     Resp Syncytial Virus by PCR NEGATIVE NEGATIVE Final    Comment: (NOTE) Fact Sheet for Patients: bloggercourse.com  Fact Sheet for Healthcare Providers: seriousbroker.it  This test is not yet approved or cleared by the [place]  FDA and has been authorized for detection and/or diagnosis of SARS-CoV-2 by FDA under an Emergency Use Authorization (EUA). This EUA will remain in effect (meaning this test can be used) for the duration of the COVID-19 declaration under Section 564(b)(1) of the Act, 21 U.S.C. section 360bbb-3(b)(1), unless the authorization is terminated or revoked.  Performed at [facility], [address] [place], [place] [address]   Blood Culture (routine x 2)     Status: None (Preliminary result)   Collection Time: 08/10/24  4:36 AM   Specimen: BLOOD  Result Value Ref Range Status   Specimen Description BLOOD BLOOD RIGHT ARM  Final   Special Requests   Final    BOTTLES DRAWN AEROBIC AND ANAEROBIC Blood Culture adequate volume   Culture   Final    NO GROWTH 4 DAYS Performed at [facility], [address], [place], [place] [address]    Report Status PENDING  Incomplete  Blood Culture (routine x 2)     Status: None (Preliminary result)   Collection Time: 08/10/24  4:36 AM   Specimen: BLOOD  Result Value Ref Range Status   Specimen Description BLOOD BLOOD LEFT ARM  Final   Special Requests   Final    BOTTLES DRAWN AEROBIC AND ANAEROBIC Blood Culture adequate volume   Culture   Final    NO GROWTH 4 DAYS Performed at [facility], [address] [place], [place] [address]    Report Status PENDING  Incomplete  MRSA Next Gen by PCR, Nasal     Status: None   Collection Time: 08/12/24  8:21 AM   Specimen: Nasal Mucosa; Nasal Swab  Result Value Ref Range Status   MRSA by PCR Next  Gen NOT DETECTED NOT DETECTED Final    Comment: (NOTE) The GeneXpert MRSA Assay (FDA approved for NASAL specimens only), is one component of a comprehensive MRSA colonization surveillance program. It is not intended to diagnose MRSA infection nor to guide or monitor treatment for MRSA infections. Test performance is not FDA approved in patients less than 13 years old. Performed at [facility], [address], [place], [place] [address]          Radiology Studies: No results found.      Scheduled Meds:  amLODipine   5 mg Oral Daily   atorvastatin   10 mg Oral Daily   calcium  carbonate  500 mg of elemental calcium  Oral BID WC   cholecalciferol   1,000 Units Oral Daily   clopidogrel   75 mg Oral Daily  dextromethorphan -guaiFENesin   1 tablet Oral BID   enoxaparin  (LOVENOX ) injection  40 mg Subcutaneous Q24H   escitalopram   10 mg Oral Daily   ferrous sulfate   325 mg Oral Q breakfast   insulin  aspart  0-6 Units Subcutaneous Q4H   insulin  aspart  3 Units Subcutaneous TID WC   insulin  glargine-yfgn  12 Units Subcutaneous BID   losartan   50 mg Oral Daily   methylPREDNISolone  (SOLU-MEDROL ) injection  40 mg Intravenous Daily   metoprolol  succinate  50 mg Oral Daily   pantoprazole   40 mg Oral Daily   potassium chloride  SA  10 mEq Oral QODAY   pregabalin   75 mg Oral TID   vitamin B-12  100 mcg Oral Daily   Continuous Infusions:  cefTRIAXone  (ROCEPHIN )  IV Stopped (08/13/24 1801)     LOS: 4 days     Angela KATHEE Robson, MD [facility]   If 7PM-7AM, please contact night-coverage  08/14/2024, 1:44 PM   "

## 2024-08-15 ENCOUNTER — Ambulatory Visit (INDEPENDENT_AMBULATORY_CARE_PROVIDER_SITE_OTHER): Admitting: Nurse Practitioner

## 2024-08-15 ENCOUNTER — Ambulatory Visit: Admitting: Nurse Practitioner

## 2024-08-15 ENCOUNTER — Encounter (INDEPENDENT_AMBULATORY_CARE_PROVIDER_SITE_OTHER)

## 2024-08-15 DIAGNOSIS — J9621 Acute and chronic respiratory failure with hypoxia: Secondary | ICD-10-CM | POA: Diagnosis not present

## 2024-08-15 LAB — GLUCOSE, CAPILLARY
Glucose-Capillary: 150 mg/dL — ABNORMAL HIGH (ref 70–99)
Glucose-Capillary: 153 mg/dL — ABNORMAL HIGH (ref 70–99)
Glucose-Capillary: 168 mg/dL — ABNORMAL HIGH (ref 70–99)
Glucose-Capillary: 189 mg/dL — ABNORMAL HIGH (ref 70–99)
Glucose-Capillary: 282 mg/dL — ABNORMAL HIGH (ref 70–99)
Glucose-Capillary: 326 mg/dL — ABNORMAL HIGH (ref 70–99)
Glucose-Capillary: 49 mg/dL — ABNORMAL LOW (ref 70–99)
Glucose-Capillary: 93 mg/dL (ref 70–99)

## 2024-08-15 LAB — CULTURE, BLOOD (ROUTINE X 2)
Culture: NO GROWTH
Culture: NO GROWTH
Special Requests: ADEQUATE
Special Requests: ADEQUATE

## 2024-08-15 MED ORDER — PREDNISONE 20 MG PO TABS
40.0000 mg | ORAL_TABLET | Freq: Every day | ORAL | Status: DC
  Administered 2024-08-16: 40 mg via ORAL
  Filled 2024-08-15: qty 2

## 2024-08-15 MED ORDER — ROFLUMILAST 500 MCG PO TABS
500.0000 ug | ORAL_TABLET | Freq: Every day | ORAL | Status: DC
  Administered 2024-08-15 – 2024-08-18 (×4): 500 ug via ORAL
  Filled 2024-08-15 (×4): qty 1

## 2024-08-15 NOTE — Plan of Care (Signed)
  Problem: Clinical Measurements: Goal: Respiratory complications will improve Outcome: Progressing Goal: Cardiovascular complication will be avoided Outcome: Progressing   Problem: Nutrition: Goal: Adequate nutrition will be maintained Outcome: Progressing   Problem: Elimination: Goal: Will not experience complications related to bowel motility Outcome: Progressing Goal: Will not experience complications related to urinary retention Outcome: Progressing   Problem: Safety: Goal: Ability to remain free from injury will improve Outcome: Progressing

## 2024-08-15 NOTE — Progress Notes (Signed)
 " PROGRESS NOTE    [name]  FMW:[id number] DOB: 12-16-1955 DOA: 08/10/2024 PCP: Liana Fish, NP    Brief Narrative:   Angela Jensen is a 68 y.o. female with medical history significant for asthma, COPD, osteoarthritis, essential hypertension, CHF, dyslipidemia and chronic respiratory failure on home O2 and OSA, who presented to the emergency room for worsening dyspnea . Patient found to have sepsis due to pneumonia and admitted for further management.   Assessment & Plan:   Principal Problem:   Acute on chronic respiratory failure with hypoxia (HCC) Active Problems:   Sepsis due to pneumonia (HCC)   COPD with acute exacerbation (HCC)   Anxiety and depression   Dyslipidemia   Type 2 diabetes mellitus with peripheral neuropathy (HCC)   GERD without esophagitis   Essential hypertension  Acute on chronic respiratory failure with hypoxia (HCC) - This is likely secondary to her right lower lobe pneumonia as well as COPD exacerbation. Patient initially required BiPAP which has been weaned off Still on 4 L Plan: Continue incentive spirometer Continue current oxygen  requirement to maintain appropriate saturation  manage underlying COPD as well as pneumonia   Sepsis due to pneumonia [facility]) Completed course of ceftriaxone  and azithromycin  Follow-up on culture results, no growth to date   COPD with acute exacerbation (HCC) Continue p.o. prednisone  Continue bronchodilator regimen Add Daliresp  12/30 Wean oxygen  as tolerated If improved consider discharge 12/31   Essential hypertension Better controlled Continue current regimen   GERD without esophagitis PPI   Type 2 diabetes mellitus with peripheral neuropathy (HCC) Basal bolus regimen Lyrica  Hold metformin    Dyslipidemia PTA statin   Anxiety and depression Home Xanax  and Lexapro     DVT prophylaxis: SQ Lovenox  Code Status: Full Family Communication: None today Disposition Plan: Status is:  Inpatient Remains inpatient appropriate because: COPD   Level of care: Progressive  Consultants:  None  Procedures:  None  Antimicrobials: Ceftriaxone  Azithromycin    Subjective: Seen and examined.  Respiratory status still tenuous.  Not yet at baseline.  Still wheezing.  Objective: Vitals:   08/15/24 0430 08/15/24 0821 08/15/24 1154 08/15/24 1536  BP: 116/73 (!) 145/83 132/60 130/79  Pulse: 78 92 90 80  Resp: 18 18 18 18   Temp: 98.3 F (36.8 C) 97.9 F (36.6 C) 98.3 F (36.8 C) 98.6 F (37 C)  TempSrc:  Oral Oral Oral  SpO2: 93% 93% 91% 96%  Weight:      Height:        Intake/Output Summary (Last 24 hours) at 08/15/2024 1551 Last data filed at 08/15/2024 0900 Gross per 24 hour  Intake 480 ml  Output 200 ml  Net 280 ml   Filed Weights   08/10/24 0328  Weight: 54.8 kg    Examination:  General exam: No acute distress.  Appears fatigued Respiratory system: Diminished air entry.  Bibasilar wheeze.  Normal work of breathing.  4 L Cardiovascular system: S2, RRR, no murmurs, no pedal edema Gastrointestinal system: Soft, NT/ND, normal bowel sounds Central nervous system: Alert and oriented. No focal neurological deficits. Extremities: Symmetric 5 x 5 power. Skin: No rashes, lesions or ulcers Psychiatry: Judgement and insight appear normal. Mood & affect appropriate.     Data Reviewed: I have personally reviewed following labs and imaging studies  CBC: Recent Labs  Lab 08/10/24 0342 08/10/24 0737 08/11/24 1126 08/12/24 0353 08/13/24 0410  WBC 8.7 11.1* 12.7* 8.8 7.7  NEUTROABS 6.9  --  11.7* 6.3 5.6  HGB 11.3* 10.5* 9.7* 9.3*  9.5*  HCT 38.7 35.3* 32.6* 32.0* 32.9*  MCV 90.2 90.5 88.6 90.7 91.6  PLT 228 210 211 205 215   Basic Metabolic Panel: Recent Labs  Lab 08/10/24 0342 08/10/24 0737 08/11/24 1126 08/12/24 0353 08/13/24 0410  NA 140 138 137 141 142  K 4.0 4.6 4.3 3.4* 4.4  CL 100 99 100 101 104  CO2 28 30 31  34* 33*  GLUCOSE 247*  340* 371* 116* 156*  BUN 16 15 20 21 21   CREATININE 0.63 0.65 0.60 0.59 0.50  CALCIUM  9.0 8.6* 8.5* 9.1 9.3   GFR: Estimated Creatinine Clearance: 50.8 mL/min (by C-G formula based on SCr of 0.5 mg/dL). Liver Function Tests: Recent Labs  Lab 08/10/24 0342  AST 31  ALT 20  ALKPHOS 61  BILITOT 0.4  PROT 7.0  ALBUMIN 4.3   No results for input(s): LIPASE, AMYLASE in the last 168 hours. No results for input(s): AMMONIA in the last 168 hours. Coagulation Profile: Recent Labs  Lab 08/10/24 0737  INR 1.0   Cardiac Enzymes: No results for input(s): CKTOTAL, CKMB, CKMBINDEX, TROPONINI in the last 168 hours. BNP (last 3 results) Recent Labs    08/10/24 0342  PROBNP 196.0   HbA1C: No results for input(s): HGBA1C in the last 72 hours.  CBG: Recent Labs  Lab 08/15/24 0021 08/15/24 0432 08/15/24 0821 08/15/24 0848 08/15/24 1148  GLUCAP 282* 153* 49* 93 189*   Lipid Profile: No results for input(s): CHOL, HDL, LDLCALC, TRIG, CHOLHDL, LDLDIRECT in the last 72 hours. Thyroid  Function Tests: No results for input(s): TSH, T4TOTAL, FREET4, T3FREE, THYROIDAB in the last 72 hours. Anemia Panel: No results for input(s): VITAMINB12, FOLATE, FERRITIN, TIBC, IRON , RETICCTPCT in the last 72 hours. Sepsis Labs: Recent Labs  Lab 08/10/24 0342  LATICACIDVEN 1.5    Recent Results (from the past 240 hours)  Resp panel by RT-PCR (RSV, Flu A&B, Covid) Anterior Nasal Swab     Status: None   Collection Time: 08/10/24  3:42 AM   Specimen: Anterior Nasal Swab  Result Value Ref Range Status   SARS Coronavirus 2 by RT PCR NEGATIVE NEGATIVE Final    Comment: (NOTE) SARS-CoV-2 target nucleic acids are NOT DETECTED.  The SARS-CoV-2 RNA is generally detectable in upper respiratory specimens during the acute phase of infection. The lowest concentration of SARS-CoV-2 viral copies this assay can detect is 138 copies/mL. A negative result does  not preclude SARS-Cov-2 infection and should not be used as the sole basis for treatment or other patient management decisions. A negative result may occur with  improper specimen collection/handling, submission of specimen other than nasopharyngeal swab, presence of viral mutation(s) within the areas targeted by this assay, and inadequate number of viral copies(<138 copies/mL). A negative result must be combined with clinical observations, patient history, and epidemiological information. The expected result is Negative.  Fact Sheet for Patients:  bloggercourse.com  Fact Sheet for Healthcare Providers:  seriousbroker.it  This test is no t yet approved or cleared by the [place]  FDA and  has been authorized for detection and/or diagnosis of SARS-CoV-2 by FDA under an Emergency Use Authorization (EUA). This EUA will remain  in effect (meaning this test can be used) for the duration of the COVID-19 declaration under Section 564(b)(1) of the Act, 21 U.S.C.section 360bbb-3(b)(1), unless the authorization is terminated  or revoked sooner.       Influenza A by PCR NEGATIVE NEGATIVE Final   Influenza B by PCR NEGATIVE NEGATIVE Final  Comment: (NOTE) The Xpert Xpress SARS-CoV-2/FLU/RSV plus assay is intended as an aid in the diagnosis of influenza from Nasopharyngeal swab specimens and should not be used as a sole basis for treatment. Nasal washings and aspirates are unacceptable for Xpert Xpress SARS-CoV-2/FLU/RSV testing.  Fact Sheet for Patients: bloggercourse.com  Fact Sheet for Healthcare Providers: seriousbroker.it  This test is not yet approved or cleared by the [place]  FDA and has been authorized for detection and/or diagnosis of SARS-CoV-2 by FDA under an Emergency Use Authorization (EUA). This EUA will remain in effect (meaning this test can be used) for the  duration of the COVID-19 declaration under Section 564(b)(1) of the Act, 21 U.S.C. section 360bbb-3(b)(1), unless the authorization is terminated or revoked.     Resp Syncytial Virus by PCR NEGATIVE NEGATIVE Final    Comment: (NOTE) Fact Sheet for Patients: bloggercourse.com  Fact Sheet for Healthcare Providers: seriousbroker.it  This test is not yet approved or cleared by the [place]  FDA and has been authorized for detection and/or diagnosis of SARS-CoV-2 by FDA under an Emergency Use Authorization (EUA). This EUA will remain in effect (meaning this test can be used) for the duration of the COVID-19 declaration under Section 564(b)(1) of the Act, 21 U.S.C. section 360bbb-3(b)(1), unless the authorization is terminated or revoked.  Performed at [facility], [address] [place], [place] [address]   Blood Culture (routine x 2)     Status: None   Collection Time: 08/10/24  4:36 AM   Specimen: BLOOD  Result Value Ref Range Status   Specimen Description BLOOD BLOOD RIGHT ARM  Final   Special Requests   Final    BOTTLES DRAWN AEROBIC AND ANAEROBIC Blood Culture adequate volume   Culture   Final    NO GROWTH 5 DAYS Performed at [facility], [address] [place], [place] [address]    Report Status 08/15/2024 FINAL  Final  Blood Culture (routine x 2)     Status: None   Collection Time: 08/10/24  4:36 AM   Specimen: BLOOD  Result Value Ref Range Status   Specimen Description BLOOD BLOOD LEFT ARM  Final   Special Requests   Final    BOTTLES DRAWN AEROBIC AND ANAEROBIC Blood Culture adequate volume   Culture   Final    NO GROWTH 5 DAYS Performed at [facility], [address], [place], [place] [address]    Report Status 08/15/2024 FINAL  Final  MRSA Next Gen by PCR, Nasal     Status: None   Collection Time: 08/12/24  8:21 AM   Specimen: Nasal Mucosa; Nasal Swab  Result Value Ref  Range Status   MRSA by PCR Next Gen NOT DETECTED NOT DETECTED Final    Comment: (NOTE) The GeneXpert MRSA Assay (FDA approved for NASAL specimens only), is one component of a comprehensive MRSA colonization surveillance program. It is not intended to diagnose MRSA infection nor to guide or monitor treatment for MRSA infections. Test performance is not FDA approved in patients less than 28 years old. Performed at [facility], [address], [place], [place] [address]          Radiology Studies: No results found.      Scheduled Meds:  amLODipine   5 mg Oral Daily   atorvastatin   10 mg Oral Daily   calcium  carbonate  500 mg of elemental calcium  Oral BID WC   cholecalciferol   1,000 Units Oral Daily   clopidogrel   75 mg  Oral Daily   dextromethorphan -guaiFENesin   1 tablet Oral BID   enoxaparin  (LOVENOX ) injection  40 mg Subcutaneous Q24H   escitalopram   10 mg Oral Daily   ferrous sulfate   325 mg Oral Q breakfast   insulin  aspart  0-6 Units Subcutaneous Q4H   insulin  aspart  3 Units Subcutaneous TID WC   insulin  glargine-yfgn  12 Units Subcutaneous BID   losartan   50 mg Oral Daily   methylPREDNISolone  (SOLU-MEDROL ) injection  40 mg Intravenous Daily   metoprolol  succinate  50 mg Oral Daily   pantoprazole   40 mg Oral Daily   potassium chloride  SA  10 mEq Oral QODAY   pregabalin   75 mg Oral TID   roflumilast   500 mcg Oral Daily   vitamin B-12  100 mcg Oral Daily   Continuous Infusions:     LOS: 5 days     Calvin KATHEE Robson, MD [facility]   If 7PM-7AM, please contact night-coverage  08/15/2024, 3:51 PM   "

## 2024-08-15 NOTE — TOC Progression Note (Signed)
 Transition of Care [facility]) - Progression Note    Patient Details  Name: Angela Jensen MRN: [id number] Date of Birth: 1955/11/11  Transition of [facility]) CM/SW Contact  Lauraine JAYSON Carpen, LCSW Phone Number: 08/15/2024, 8:54 AM  Clinical Narrative:   Per chart review, patient gets her oxygen  through [employer]. CSW contact liaison who confirmed her orders are for 5 L and she has a 10 L concentrator at home.  Expected Discharge Plan and Services   Discharge Planning Services: CM Consult   Living arrangements for the past 2 months: Single Family Home                                       Social Drivers of Health (SDOH) Interventions SDOH Screenings   Food Insecurity: Patient Declined (08/11/2024)  Housing: Low Risk (08/11/2024)  Transportation Needs: No Transportation Needs (08/11/2024)  Utilities: Not At Risk (08/11/2024)  Alcohol Screen: Low Risk (12/11/2021)  Depression (PHQ2-9): Medium Risk (04/13/2024)  Financial Resource Strain: Medium Risk (06/04/2024)   Received from [facility]  Social Connections: Moderately Integrated (08/11/2024)  Tobacco Use: Medium Risk (08/10/2024)    Readmission Risk Interventions    08/11/2024   11:06 AM 04/19/2024   12:06 PM 03/21/2024    3:56 PM  Readmission Risk Prevention Plan  Transportation Screening Complete Complete Complete  HRI or Home Care Consult   Complete  Social Work Consult for Recovery Care Planning/Counseling   Complete  Palliative Care Screening   Not Applicable  Medication Review Oceanographer) Complete Complete Complete  PCP or Specialist appointment within 3-5 days of discharge Complete Complete   HRI or Home Care Consult Complete    SW Recovery Care/Counseling Consult Complete Complete   Palliative Care Screening Not Applicable Not Applicable   Skilled Nursing Facility Not Applicable Not Applicable

## 2024-08-16 ENCOUNTER — Encounter

## 2024-08-16 DIAGNOSIS — J9621 Acute and chronic respiratory failure with hypoxia: Secondary | ICD-10-CM | POA: Diagnosis not present

## 2024-08-16 LAB — GLUCOSE, CAPILLARY
Glucose-Capillary: 89 mg/dL (ref 70–99)
Glucose-Capillary: 124 mg/dL — ABNORMAL HIGH (ref 70–99)
Glucose-Capillary: 66 mg/dL — ABNORMAL LOW (ref 70–99)
Glucose-Capillary: 199 mg/dL — ABNORMAL HIGH (ref 70–99)
Glucose-Capillary: 277 mg/dL — ABNORMAL HIGH (ref 70–99)
Glucose-Capillary: 268 mg/dL — ABNORMAL HIGH (ref 70–99)

## 2024-08-16 MED ORDER — INSULIN GLARGINE-YFGN 100 UNIT/ML ~~LOC~~ SOLN
10.0000 [IU] | Freq: Every day | SUBCUTANEOUS | Status: DC
  Administered 2024-08-17: 10 [IU] via SUBCUTANEOUS
  Filled 2024-08-16: qty 0.1

## 2024-08-16 NOTE — Plan of Care (Signed)

## 2024-08-16 NOTE — Plan of Care (Signed)
  Problem: Clinical Measurements: Goal: Ability to maintain clinical measurements within normal limits will improve Outcome: Progressing   Problem: Activity: Goal: Risk for activity intolerance will decrease Outcome: Progressing   Problem: Elimination: Goal: Will not experience complications related to urinary retention Outcome: Progressing   Problem: Pain Managment: Goal: General experience of comfort will improve and/or be controlled Outcome: Progressing   Problem: Safety: Goal: Ability to remain free from injury will improve Outcome: Progressing

## 2024-08-16 NOTE — Progress Notes (Signed)
 "    Progress Note    Angela Jensen  FMW:[id number] DOB: [birth year]  DOA: 08/10/2024 PCP: Liana Fish, NP      Brief Narrative:    Medical records reviewed and are as summarized below:  Angela Jensen is a 68 y.o. female with medical history significant for asthma, COPD, osteoarthritis, essential hypertension, CHF, dyslipidemia and chronic respiratory failure on home O2 and OSA, who presented to the emergency room for worsening dyspnea . Patient found to have sepsis due to pneumonia and admitted for further management.        Assessment/Plan:   Principal Problem:   Acute on chronic respiratory failure with hypoxia (HCC) Active Problems:   Sepsis due to pneumonia (HCC)   COPD with acute exacerbation (HCC)   Anxiety and depression   Dyslipidemia   Type 2 diabetes mellitus with peripheral neuropathy (HCC)   GERD without esophagitis   Essential hypertension     Body mass index is 24.4 kg/m.   Acute on chronic hypoxic respiratory failure: Patient is on 5 L oxygen  which is her baseline. Previously required BiPAP.   Sepsis secondary to pneumonia: Completed ceftriaxone  and azithromycin . No growth on blood cultures.   COPD with acute exacerbation: Continue bronchodilators. Discontinue prednisone . Continue Daliresp .  Outpatient follow-up with pulmonologist.   Type II DM with hyperglycemia and recurrent hypoglycemia: Glucose down to 66 today.  Glucose was down to 49 on morning of 08/15/2024 and up to 326 on the afternoon of 08/15/2024.  Decrease Lantus  from 12 units twice daily to 10 units nightly (which is her home dose).  NovoLog  as needed for hyperglycemia.   Comorbidities include hypertension, anxiety, depression, GERD, dyslipidemia  Diet Order             Diet heart healthy/carb modified Room service appropriate? Yes; Fluid consistency: Thin  Diet effective now                                   Consultants: None  Procedures: None    Medications:    amLODipine   5 mg Oral Daily   atorvastatin   10 mg Oral Daily   calcium  carbonate  500 mg of elemental calcium  Oral BID WC   cholecalciferol   1,000 Units Oral Daily   clopidogrel   75 mg Oral Daily   dextromethorphan -guaiFENesin   1 tablet Oral BID   enoxaparin  (LOVENOX ) injection  40 mg Subcutaneous Q24H   escitalopram   10 mg Oral Daily   ferrous sulfate   325 mg Oral Q breakfast   insulin  aspart  0-6 Units Subcutaneous Q4H   insulin  aspart  3 Units Subcutaneous TID WC   insulin  glargine-yfgn  12 Units Subcutaneous BID   losartan   50 mg Oral Daily   metoprolol  succinate  50 mg Oral Daily   pantoprazole   40 mg Oral Daily   potassium chloride  SA  10 mEq Oral QODAY   predniSONE   40 mg Oral Q breakfast   pregabalin   75 mg Oral TID   roflumilast   500 mcg Oral Daily   vitamin B-12  100 mcg Oral Daily   Continuous Infusions:   Anti-infectives (From admission, onward)    Start     Dose/Rate Route Frequency Ordered Stop   08/11/24 0600  vancomycin  (VANCOREADY) IVPB 750 mg/150 mL  Status:  Discontinued        750 mg 150 mL/hr over 60 Minutes Intravenous Every 24 hours 08/10/24 0554 08/13/24 0829  08/10/24 1800  cefTRIAXone  (ROCEPHIN ) 2 g in sodium chloride  0.9 % 100 mL IVPB        2 g 200 mL/hr over 30 Minutes Intravenous Every 24 hours 08/10/24 0504 08/14/24 1755   08/10/24 0800  azithromycin  (ZITHROMAX ) 500 mg in sodium chloride  0.9 % 250 mL IVPB        500 mg 250 mL/hr over 60 Minutes Intravenous Every 24 hours 08/10/24 0504 08/14/24 1028   08/10/24 0515  vancomycin  (VANCOCIN ) IVPB 1000 mg/200 mL premix  Status:  Discontinued        1,000 mg 200 mL/hr over 60 Minutes Intravenous  Once 08/10/24 0504 08/10/24 0508   08/10/24 0500  vancomycin  (VANCOCIN ) IVPB 1000 mg/200 mL premix        1,000 mg 200 mL/hr over 60 Minutes Intravenous  Once 08/10/24 0450 08/10/24 0656   08/10/24 0500   ceFEPIme  (MAXIPIME ) 2 g in sodium chloride  0.9 % 100 mL IVPB        2 g 200 mL/hr over 30 Minutes Intravenous  Once 08/10/24 0450 08/10/24 0546              Family Communication/Anticipated D/C date and plan/Code Status   DVT prophylaxis: enoxaparin  (LOVENOX ) injection 40 mg Start: 08/10/24 1000     Code Status: Full Code  Family Communication: None Disposition Plan: Plan to discharge to home   Status is: Inpatient Remains inpatient appropriate because: Recurrent hypoglycemia       Subjective:   Interval events noted.  She complains of general weakness and chest tightness.  Objective:    Vitals:   08/15/24 1952 08/15/24 2330 08/16/24 0332 08/16/24 0753  BP: 127/74 135/81 (!) 154/88 128/65  Pulse: 75 78 81 95  Resp: 19 16 16 18   Temp: (!) 97.5 F (36.4 C) 98.1 F (36.7 C) 97.8 F (36.6 C) 97.8 F (36.6 C)  TempSrc:    Oral  SpO2: 96% 91% 90% 93%  Weight:      Height:       No data found.   Intake/Output Summary (Last 24 hours) at 08/16/2024 1200 Last data filed at 08/16/2024 1032 Gross per 24 hour  Intake 0 ml  Output 250 ml  Net -250 ml   Filed Weights   08/10/24 0328  Weight: 54.8 kg    Exam:  GEN: NAD SKIN: No rash EYES: EOMI ENT: MMM CV: RRR PULM: Decreased air entry bilaterally, no wheezing or rales ABD: soft, ND, NT, +BS CNS: AAO x 3, non focal EXT: No edema or tenderness        Data Reviewed:   I have personally reviewed following labs and imaging studies:  Labs: Labs show the following:   Basic Metabolic Panel: Recent Labs  Lab 08/10/24 0342 08/10/24 0737 08/11/24 1126 08/12/24 0353 08/13/24 0410  NA 140 138 137 141 142  K 4.0 4.6 4.3 3.4* 4.4  CL 100 99 100 101 104  CO2 28 30 31  34* 33*  GLUCOSE 247* 340* 371* 116* 156*  BUN 16 15 20 21 21   CREATININE 0.63 0.65 0.60 0.59 0.50  CALCIUM  9.0 8.6* 8.5* 9.1 9.3   GFR Estimated Creatinine Clearance: 50.8 mL/min (by C-G formula based on SCr of 0.5  mg/dL). Liver Function Tests: Recent Labs  Lab 08/10/24 0342  AST 31  ALT 20  ALKPHOS 61  BILITOT 0.4  PROT 7.0  ALBUMIN 4.3   No results for input(s): LIPASE, AMYLASE in the last 168 hours. No results for input(s): AMMONIA  in the last 168 hours. Coagulation profile Recent Labs  Lab 08/10/24 0737  INR 1.0    CBC: Recent Labs  Lab 08/10/24 0342 08/10/24 0737 08/11/24 1126 08/12/24 0353 08/13/24 0410  WBC 8.7 11.1* 12.7* 8.8 7.7  NEUTROABS 6.9  --  11.7* 6.3 5.6  HGB 11.3* 10.5* 9.7* 9.3* 9.5*  HCT 38.7 35.3* 32.6* 32.0* 32.9*  MCV 90.2 90.5 88.6 90.7 91.6  PLT 228 210 211 205 215   Cardiac Enzymes: No results for input(s): CKTOTAL, CKMB, CKMBINDEX, TROPONINI in the last 168 hours. BNP (last 3 results) Recent Labs    08/10/24 0342  PROBNP 196.0   CBG: Recent Labs  Lab 08/15/24 1954 08/15/24 2351 08/16/24 0439 08/16/24 0753 08/16/24 1128  GLUCAP 168* 150* 89 124* 66*   D-Dimer: No results for input(s): DDIMER in the last 72 hours. Hgb A1c: No results for input(s): HGBA1C in the last 72 hours. Lipid Profile: No results for input(s): CHOL, HDL, LDLCALC, TRIG, CHOLHDL, LDLDIRECT in the last 72 hours. Thyroid  function studies: No results for input(s): TSH, T4TOTAL, T3FREE, THYROIDAB in the last 72 hours.  Invalid input(s): FREET3 Anemia work up: No results for input(s): VITAMINB12, FOLATE, FERRITIN, TIBC, IRON , RETICCTPCT in the last 72 hours. Sepsis Labs: Recent Labs  Lab 08/10/24 0342 08/10/24 0737 08/11/24 1126 08/12/24 0353 08/13/24 0410  WBC 8.7 11.1* 12.7* 8.8 7.7  LATICACIDVEN 1.5  --   --   --   --     Microbiology Recent Results (from the past 240 hours)  Resp panel by RT-PCR (RSV, Flu A&B, Covid) Anterior Nasal Swab     Status: None   Collection Time: 08/10/24  3:42 AM   Specimen: Anterior Nasal Swab  Result Value Ref Range Status   SARS Coronavirus 2 by RT PCR NEGATIVE NEGATIVE  Final    Comment: (NOTE) SARS-CoV-2 target nucleic acids are NOT DETECTED.  The SARS-CoV-2 RNA is generally detectable in upper respiratory specimens during the acute phase of infection. The lowest concentration of SARS-CoV-2 viral copies this assay can detect is 138 copies/mL. A negative result does not preclude SARS-Cov-2 infection and should not be used as the sole basis for treatment or other patient management decisions. A negative result may occur with  improper specimen collection/handling, submission of specimen other than nasopharyngeal swab, presence of viral mutation(s) within the areas targeted by this assay, and inadequate number of viral copies(<138 copies/mL). A negative result must be combined with clinical observations, patient history, and epidemiological information. The expected result is Negative.  Fact Sheet for Patients:  bloggercourse.com  Fact Sheet for Healthcare Providers:  seriousbroker.it  This test is no t yet approved or cleared by the [place]  FDA and  has been authorized for detection and/or diagnosis of SARS-CoV-2 by FDA under an Emergency Use Authorization (EUA). This EUA will remain  in effect (meaning this test can be used) for the duration of the COVID-19 declaration under Section 564(b)(1) of the Act, 21 U.S.C.section 360bbb-3(b)(1), unless the authorization is terminated  or revoked sooner.       Influenza A by PCR NEGATIVE NEGATIVE Final   Influenza B by PCR NEGATIVE NEGATIVE Final    Comment: (NOTE) The Xpert Xpress SARS-CoV-2/FLU/RSV plus assay is intended as an aid in the diagnosis of influenza from Nasopharyngeal swab specimens and should not be used as a sole basis for treatment. Nasal washings and aspirates are unacceptable for Xpert Xpress SARS-CoV-2/FLU/RSV testing.  Fact Sheet for Patients: bloggercourse.com  Fact Sheet for Healthcare  Providers: seriousbroker.it  This test is not yet approved or cleared by the [place]  FDA and has been authorized for detection and/or diagnosis of SARS-CoV-2 by FDA under an Emergency Use Authorization (EUA). This EUA will remain in effect (meaning this test can be used) for the duration of the COVID-19 declaration under Section 564(b)(1) of the Act, 21 U.S.C. section 360bbb-3(b)(1), unless the authorization is terminated or revoked.     Resp Syncytial Virus by PCR NEGATIVE NEGATIVE Final    Comment: (NOTE) Fact Sheet for Patients: bloggercourse.com  Fact Sheet for Healthcare Providers: seriousbroker.it  This test is not yet approved or cleared by the [place]  FDA and has been authorized for detection and/or diagnosis of SARS-CoV-2 by FDA under an Emergency Use Authorization (EUA). This EUA will remain in effect (meaning this test can be used) for the duration of the COVID-19 declaration under Section 564(b)(1) of the Act, 21 U.S.C. section 360bbb-3(b)(1), unless the authorization is terminated or revoked.  Performed at [facility], [address] [place], [place] [address]   Blood Culture (routine x 2)     Status: None   Collection Time: 08/10/24  4:36 AM   Specimen: BLOOD  Result Value Ref Range Status   Specimen Description BLOOD BLOOD RIGHT ARM  Final   Special Requests   Final    BOTTLES DRAWN AEROBIC AND ANAEROBIC Blood Culture adequate volume   Culture   Final    NO GROWTH 5 DAYS Performed at [facility], [address], [place], [place] [address]    Report Status 08/15/2024 FINAL  Final  Blood Culture (routine x 2)     Status: None   Collection Time: 08/10/24  4:36 AM   Specimen: BLOOD  Result Value Ref Range Status   Specimen Description BLOOD BLOOD LEFT ARM  Final   Special Requests   Final    BOTTLES DRAWN AEROBIC AND ANAEROBIC Blood Culture adequate  volume   Culture   Final    NO GROWTH 5 DAYS Performed at [facility], [address], [place], [place] [address]    Report Status 08/15/2024 FINAL  Final  MRSA Next Gen by PCR, Nasal     Status: None   Collection Time: 08/12/24  8:21 AM   Specimen: Nasal Mucosa; Nasal Swab  Result Value Ref Range Status   MRSA by PCR Next Gen NOT DETECTED NOT DETECTED Final    Comment: (NOTE) The GeneXpert MRSA Assay (FDA approved for NASAL specimens only), is one component of a comprehensive MRSA colonization surveillance program. It is not intended to diagnose MRSA infection nor to guide or monitor treatment for MRSA infections. Test performance is not FDA approved in patients less than 9 years old. Performed at [facility], [address] [place], [place] [address]     Procedures and diagnostic studies:  No results found.             LOS: 6 days   Guelda Batson  [facility]   Pager on [email or web address]. If 7PM-7AM, please contact night-coverage at [email or web address]     08/16/2024, 12:00 PM           "

## 2024-08-17 DIAGNOSIS — J9621 Acute and chronic respiratory failure with hypoxia: Secondary | ICD-10-CM | POA: Diagnosis not present

## 2024-08-17 LAB — GLUCOSE, CAPILLARY
Glucose-Capillary: 83 mg/dL (ref 70–99)
Glucose-Capillary: 94 mg/dL (ref 70–99)
Glucose-Capillary: 126 mg/dL — ABNORMAL HIGH (ref 70–99)
Glucose-Capillary: 128 mg/dL — ABNORMAL HIGH (ref 70–99)
Glucose-Capillary: 167 mg/dL — ABNORMAL HIGH (ref 70–99)

## 2024-08-17 NOTE — Plan of Care (Signed)

## 2024-08-17 NOTE — Progress Notes (Signed)
 "    Progress Note    Angela Jensen  FMW:[id number] DOB: October 04, 1955  DOA: 08/10/2024 PCP: Liana Fish, NP      Brief Narrative:    Medical records reviewed and are as summarized below:  Angela Jensen is a 69 y.o. female with medical history significant for asthma, COPD, osteoarthritis, essential hypertension, CHF, dyslipidemia and chronic respiratory failure on home O2 and OSA, who presented to the emergency room for worsening dyspnea . Patient found to have sepsis due to pneumonia and admitted for further management.        Assessment/Plan:   Principal Problem:   Acute on chronic respiratory failure with hypoxia (HCC) Active Problems:   Sepsis due to pneumonia (HCC)   COPD with acute exacerbation (HCC)   Anxiety and depression   Dyslipidemia   Type 2 diabetes mellitus with peripheral neuropathy (HCC)   GERD without esophagitis   Essential hypertension     Body mass index is 24.4 kg/m.   Acute on chronic hypoxic respiratory failure: Patient is on 5 L oxygen  which is her baseline. Previously required BiPAP.   Sepsis secondary to pneumonia: Completed ceftriaxone  and azithromycin . No growth on blood cultures.   COPD with acute exacerbation: Continue bronchodilators. Completed steroids. Continue Daliresp .  Outpatient follow-up with pulmonologist.   Type II DM with hyperglycemia and recurrent hypoglycemia: Glucose levels better today.   Continue Lantus  10 units nightly.  NovoLog  as needed for hyperglycemia.   Comorbidities include hypertension, anxiety, depression, GERD, dyslipidemia   Discussed discharge plans with the patient and Marjorie, daughter, at the bedside.  Patient is medically stable for discharge.  However, patient is reluctant to go home today.  She requested spending one more day in the hospital.  She understands that she will most likely be discharged home tomorrow.  Diet Order             Diet heart healthy/carb modified Room service  appropriate? Yes; Fluid consistency: Thin  Diet effective now                                  Consultants: None  Procedures: None    Medications:    amLODipine   5 mg Oral Daily   atorvastatin   10 mg Oral Daily   calcium  carbonate  500 mg of elemental calcium  Oral BID WC   cholecalciferol   1,000 Units Oral Daily   clopidogrel   75 mg Oral Daily   dextromethorphan -guaiFENesin   1 tablet Oral BID   enoxaparin  (LOVENOX ) injection  40 mg Subcutaneous Q24H   escitalopram   10 mg Oral Daily   ferrous sulfate   325 mg Oral Q breakfast   insulin  aspart  0-6 Units Subcutaneous Q4H   insulin  aspart  3 Units Subcutaneous TID WC   insulin  glargine-yfgn  10 Units Subcutaneous QHS   losartan   50 mg Oral Daily   metoprolol  succinate  50 mg Oral Daily   pantoprazole   40 mg Oral Daily   potassium chloride  SA  10 mEq Oral QODAY   pregabalin   75 mg Oral TID   roflumilast   500 mcg Oral Daily   vitamin B-12  100 mcg Oral Daily   Continuous Infusions:   Anti-infectives (From admission, onward)    Start     Dose/Rate Route Frequency Ordered Stop   08/11/24 0600  vancomycin  (VANCOREADY) IVPB 750 mg/150 mL  Status:  Discontinued        750 mg  150 mL/hr over 60 Minutes Intravenous Every 24 hours 08/10/24 0554 08/13/24 0829   08/10/24 1800  cefTRIAXone  (ROCEPHIN ) 2 g in sodium chloride  0.9 % 100 mL IVPB        2 g 200 mL/hr over 30 Minutes Intravenous Every 24 hours 08/10/24 0504 08/14/24 1755   08/10/24 0800  azithromycin  (ZITHROMAX ) 500 mg in sodium chloride  0.9 % 250 mL IVPB        500 mg 250 mL/hr over 60 Minutes Intravenous Every 24 hours 08/10/24 0504 08/14/24 1028   08/10/24 0515  vancomycin  (VANCOCIN ) IVPB 1000 mg/200 mL premix  Status:  Discontinued        1,000 mg 200 mL/hr over 60 Minutes Intravenous  Once 08/10/24 0504 08/10/24 0508   08/10/24 0500  vancomycin  (VANCOCIN ) IVPB 1000 mg/200 mL premix        1,000 mg 200 mL/hr over 60 Minutes Intravenous  Once  08/10/24 0450 08/10/24 0656   08/10/24 0500  ceFEPIme  (MAXIPIME ) 2 g in sodium chloride  0.9 % 100 mL IVPB        2 g 200 mL/hr over 30 Minutes Intravenous  Once 08/10/24 0450 08/10/24 0546              Family Communication/Anticipated D/C date and plan/Code Status   DVT prophylaxis: enoxaparin  (LOVENOX ) injection 40 mg Start: 08/10/24 1000     Code Status: Full Code  Family Communication: Plan discussed with Marjorie, daughter, at the bedside.   Disposition Plan: Plan to discharge to home   Status is: Inpatient Remains inpatient appropriate because: Recurrent hypoglycemia       Subjective:   Interval events noted.  No new complaints.  She still feels a little short of breath but overall she is much improved.  She is just apprehensive about going home today.  Marjorie, daughter, was at the bedside  Objective:    Vitals:   08/16/24 2052 08/16/24 2331 08/17/24 0758 08/17/24 1536  BP: (!) 142/76 (!) 154/85 (!) 155/96 136/75  Pulse: 83 78 86 71  Resp: 19 20 17 17   Temp: 98.2 F (36.8 C) 97.6 F (36.4 C) 98.2 F (36.8 C) 98.5 F (36.9 C)  TempSrc:   Oral   SpO2: 90% 97% 93% 97%  Weight:      Height:       No data found.   Intake/Output Summary (Last 24 hours) at 08/17/2024 1649 Last data filed at 08/17/2024 1300 Gross per 24 hour  Intake --  Output 1150 ml  Net -1150 ml   Filed Weights   08/10/24 0328  Weight: 54.8 kg    Exam:  GEN: NAD SKIN: Warm and dry EYES: No pallor or icterus ENT: MMM CV: RRR PULM: CTA B ABD: soft, ND, NT, +BS CNS: AAO x 3, non focal EXT: No edema or tenderness       Data Reviewed:   I have personally reviewed following labs and imaging studies:  Labs: Labs show the following:   Basic Metabolic Panel: Recent Labs  Lab 08/11/24 1126 08/12/24 0353 08/13/24 0410  NA 137 141 142  K 4.3 3.4* 4.4  CL 100 101 104  CO2 31 34* 33*  GLUCOSE 371* 116* 156*  BUN 20 21 21   CREATININE 0.60 0.59 0.50  CALCIUM  8.5*  9.1 9.3   GFR Estimated Creatinine Clearance: 50.8 mL/min (by C-G formula based on SCr of 0.5 mg/dL). Liver Function Tests: No results for input(s): AST, ALT, ALKPHOS, BILITOT, PROT, ALBUMIN in the last 168 hours.  No results for input(s): LIPASE, AMYLASE in the last 168 hours. No results for input(s): AMMONIA in the last 168 hours. Coagulation profile No results for input(s): INR, PROTIME in the last 168 hours.   CBC: Recent Labs  Lab 08/11/24 1126 08/12/24 0353 08/13/24 0410  WBC 12.7* 8.8 7.7  NEUTROABS 11.7* 6.3 5.6  HGB 9.7* 9.3* 9.5*  HCT 32.6* 32.0* 32.9*  MCV 88.6 90.7 91.6  PLT 211 205 215   Cardiac Enzymes: No results for input(s): CKTOTAL, CKMB, CKMBINDEX, TROPONINI in the last 168 hours. BNP (last 3 results) Recent Labs    08/10/24 0342  PROBNP 196.0   CBG: Recent Labs  Lab 08/16/24 2328 08/17/24 0447 08/17/24 0838 08/17/24 1137 08/17/24 1621  GLUCAP 268* 83 94 126* 128*   D-Dimer: No results for input(s): DDIMER in the last 72 hours. Hgb A1c: No results for input(s): HGBA1C in the last 72 hours. Lipid Profile: No results for input(s): CHOL, HDL, LDLCALC, TRIG, CHOLHDL, LDLDIRECT in the last 72 hours. Thyroid  function studies: No results for input(s): TSH, T4TOTAL, T3FREE, THYROIDAB in the last 72 hours.  Invalid input(s): FREET3 Anemia work up: No results for input(s): VITAMINB12, FOLATE, FERRITIN, TIBC, IRON , RETICCTPCT in the last 72 hours. Sepsis Labs: Recent Labs  Lab 08/11/24 1126 08/12/24 0353 08/13/24 0410  WBC 12.7* 8.8 7.7    Microbiology Recent Results (from the past 240 hours)  Resp panel by RT-PCR (RSV, Flu A&B, Covid) Anterior Nasal Swab     Status: None   Collection Time: 08/10/24  3:42 AM   Specimen: Anterior Nasal Swab  Result Value Ref Range Status   SARS Coronavirus 2 by RT PCR NEGATIVE NEGATIVE Final    Comment: (NOTE) SARS-CoV-2 target nucleic  acids are NOT DETECTED.  The SARS-CoV-2 RNA is generally detectable in upper respiratory specimens during the acute phase of infection. The lowest concentration of SARS-CoV-2 viral copies this assay can detect is 138 copies/mL. A negative result does not preclude SARS-Cov-2 infection and should not be used as the sole basis for treatment or other patient management decisions. A negative result may occur with  improper specimen collection/handling, submission of specimen other than nasopharyngeal swab, presence of viral mutation(s) within the areas targeted by this assay, and inadequate number of viral copies(<138 copies/mL). A negative result must be combined with clinical observations, patient history, and epidemiological information. The expected result is Negative.  Fact Sheet for Patients:  bloggercourse.com  Fact Sheet for Healthcare Providers:  seriousbroker.it  This test is no t yet approved or cleared by the [place]  FDA and  has been authorized for detection and/or diagnosis of SARS-CoV-2 by FDA under an Emergency Use Authorization (EUA). This EUA will remain  in effect (meaning this test can be used) for the duration of the COVID-19 declaration under Section 564(b)(1) of the Act, 21 U.S.C.section 360bbb-3(b)(1), unless the authorization is terminated  or revoked sooner.       Influenza A by PCR NEGATIVE NEGATIVE Final   Influenza B by PCR NEGATIVE NEGATIVE Final    Comment: (NOTE) The Xpert Xpress SARS-CoV-2/FLU/RSV plus assay is intended as an aid in the diagnosis of influenza from Nasopharyngeal swab specimens and should not be used as a sole basis for treatment. Nasal washings and aspirates are unacceptable for Xpert Xpress SARS-CoV-2/FLU/RSV testing.  Fact Sheet for Patients: bloggercourse.com  Fact Sheet for Healthcare Providers: seriousbroker.it  This  test is not yet approved or cleared by the [place]  FDA and has been authorized for  detection and/or diagnosis of SARS-CoV-2 by FDA under an Emergency Use Authorization (EUA). This EUA will remain in effect (meaning this test can be used) for the duration of the COVID-19 declaration under Section 564(b)(1) of the Act, 21 U.S.C. section 360bbb-3(b)(1), unless the authorization is terminated or revoked.     Resp Syncytial Virus by PCR NEGATIVE NEGATIVE Final    Comment: (NOTE) Fact Sheet for Patients: bloggercourse.com  Fact Sheet for Healthcare Providers: seriousbroker.it  This test is not yet approved or cleared by the [place]  FDA and has been authorized for detection and/or diagnosis of SARS-CoV-2 by FDA under an Emergency Use Authorization (EUA). This EUA will remain in effect (meaning this test can be used) for the duration of the COVID-19 declaration under Section 564(b)(1) of the Act, 21 U.S.C. section 360bbb-3(b)(1), unless the authorization is terminated or revoked.  Performed at [facility], [address] [place], [place] [address]   Blood Culture (routine x 2)     Status: None   Collection Time: 08/10/24  4:36 AM   Specimen: BLOOD  Result Value Ref Range Status   Specimen Description BLOOD BLOOD RIGHT ARM  Final   Special Requests   Final    BOTTLES DRAWN AEROBIC AND ANAEROBIC Blood Culture adequate volume   Culture   Final    NO GROWTH 5 DAYS Performed at [facility], [address], [place], [place] [address]    Report Status 08/15/2024 FINAL  Final  Blood Culture (routine x 2)     Status: None   Collection Time: 08/10/24  4:36 AM   Specimen: BLOOD  Result Value Ref Range Status   Specimen Description BLOOD BLOOD LEFT ARM  Final   Special Requests   Final    BOTTLES DRAWN AEROBIC AND ANAEROBIC Blood Culture adequate volume   Culture   Final    NO GROWTH 5 DAYS Performed at  [facility], [address], [place], [place] [address]    Report Status 08/15/2024 FINAL  Final  MRSA Next Gen by PCR, Nasal     Status: None   Collection Time: 08/12/24  8:21 AM   Specimen: Nasal Mucosa; Nasal Swab  Result Value Ref Range Status   MRSA by PCR Next Gen NOT DETECTED NOT DETECTED Final    Comment: (NOTE) The GeneXpert MRSA Assay (FDA approved for NASAL specimens only), is one component of a comprehensive MRSA colonization surveillance program. It is not intended to diagnose MRSA infection nor to guide or monitor treatment for MRSA infections. Test performance is not FDA approved in patients less than 63 years old. Performed at [facility], [address] [place], [place] [address]     Procedures and diagnostic studies:  No results found.             LOS: 7 days   Hortensia Duffin  [facility]   Pager on [email or web address]. If 7PM-7AM, please contact night-coverage at [email or web address]     08/17/2024, 4:49 PM           "

## 2024-08-18 ENCOUNTER — Other Ambulatory Visit: Payer: Self-pay

## 2024-08-18 DIAGNOSIS — J9621 Acute and chronic respiratory failure with hypoxia: Secondary | ICD-10-CM | POA: Diagnosis not present

## 2024-08-18 LAB — GLUCOSE, CAPILLARY
Glucose-Capillary: 103 mg/dL — ABNORMAL HIGH (ref 70–99)
Glucose-Capillary: 108 mg/dL — ABNORMAL HIGH (ref 70–99)
Glucose-Capillary: 112 mg/dL — ABNORMAL HIGH (ref 70–99)

## 2024-08-18 MED ORDER — INSULIN GLARGINE (1 UNIT DIAL) 300 UNIT/ML ~~LOC~~ SOPN: 10.0000 [IU] | PEN_INJECTOR | Freq: Every day | SUBCUTANEOUS | Status: DC

## 2024-08-18 MED ORDER — AMLODIPINE BESYLATE 2.5 MG PO TABS
2.5000 mg | ORAL_TABLET | Freq: Every day | ORAL | 0 refills | Status: DC
  Filled 2024-08-18: qty 30, 30d supply, fill #0

## 2024-08-18 NOTE — Discharge Summary (Signed)
 " Physician Discharge Summary   Patient: Angela Jensen MRN: [id number] DOB: [birth year]  Admit date:     08/10/2024  Discharge date: 08/18/2024  Discharge Physician: AIDA CHO   PCP: Liana Fish, NP   Recommendations at discharge:   Follow-up with PCP in 1 week Recommended follow-up with primary pulmonologist, Dr.Aleskerov, as soon as possible  Discharge Diagnoses: Principal Problem:   Acute on chronic respiratory failure with hypoxia (HCC) Active Problems:   Sepsis due to pneumonia (HCC)   COPD with acute exacerbation (HCC)   Anxiety and depression   Dyslipidemia   Type 2 diabetes mellitus with peripheral neuropathy (HCC)   GERD without esophagitis   Essential hypertension  Resolved Problems:   * No resolved hospital problems. [place] [facility] Course:  Angela Jensen is a 69 y.o. female with medical history significant for asthma, COPD, osteoarthritis, essential hypertension, CHF, dyslipidemia and chronic respiratory failure on home O2 and OSA, who presented to the emergency room for worsening dyspnea . Patient found to have sepsis due to pneumonia and admitted for further management.      Assessment and Plan:   Acute on chronic hypoxic respiratory failure: Patient is on 5 L oxygen  which is her baseline. Previously required BiPAP.     Sepsis secondary to pneumonia: Completed ceftriaxone  and azithromycin . No growth on blood cultures.     COPD with acute exacerbation: Continue bronchodilators. Completed steroids. Outpatient follow-up with pulmonologist.     Type II DM with hyperglycemia and recurrent hypoglycemia: Glucose levels have stabilized. Continue Lantus  8 to 10 units daily (home dose) and metformin .   Comorbidities include hypertension, anxiety, depression, GERD, dyslipidemia    Her condition has improved.  She said she feels great.  She is deemed stable for discharge home today.       Consultants: None Procedures performed: None  Disposition:  Home Diet recommendation:  Cardiac and Carb modified diet DISCHARGE MEDICATION: Allergies as of 08/18/2024       Reactions   Iodinated Contrast Media Itching   Trelegy Ellipta  [fluticasone -umeclidin-vilant]    Had breathing issues   Bactoshield Chg [chlorhexidine  Gluconate] Itching, Other (See Comments)   Dry. Flaking, peeling skin        Medication List     TAKE these medications    Accu-Chek Guide Test test strip Generic drug: glucose blood USE TO CHECK BLOOD SUGAR 5 TIMES DAILY   Accu-Chek Guide w/Device Kit Use as directed Dx e11.65   Accu-Chek Softclix Lancets lancets Use 1 lancet 3 times daily to check glucose for diabetes   albuterol  (5 MG/ML) 0.5% nebulizer solution Commonly known as: PROVENTIL  Take 2.5 mg by nebulization every 6 (six) hours as needed for wheezing or shortness of breath.   albuterol  108 (90 Base) MCG/ACT inhaler Commonly known as: VENTOLIN  HFA INHALE 2 PUFFS BY MOUTH EVERY 6 HOURS AS NEEDED FOR WHEEZING OR SHORTNESS OF BREATH   ALPRAZolam  1 MG tablet Commonly known as: XANAX  Take 1 tablet (1 mg total) by mouth 2 (two) times daily as needed for anxiety or sleep.   amLODipine  2.5 MG tablet Commonly known as: NORVASC  Take 1 tablet (2.5 mg total) by mouth daily.   atorvastatin  10 MG tablet Commonly known as: Lipitor Take 1 tablet (10 mg total) by mouth daily.   budesonide -glycopyrrolate -formoterol  160-9-4.8 MCG/ACT Aero inhaler Commonly known as: BREZTRI  Inhale 2 puffs into the lungs 2 (two) times daily.   calcium  carbonate 1500 (600 Ca) MG Tabs tablet Commonly known as: OSCAL Take 600 mg  of elemental calcium  by mouth 2 (two) times daily with a meal.   clopidogrel  75 MG tablet Commonly known as: PLAVIX  Take 1 tablet (75 mg total) by mouth daily.   cyclobenzaprine  10 MG tablet Commonly known as: FLEXERIL  Take 1 tablet (10 mg total) by mouth at bedtime. Take one tab po qhs for back spasm prn only   escitalopram  10 MG tablet Commonly  known as: LEXAPRO  Take 1 tablet by mouth once daily   ferrous sulfate  325 (65 FE) MG tablet Take 1 tablet (325 mg total) by mouth daily with breakfast.   furosemide  20 MG tablet Commonly known as: LASIX  Take 1 tablet (20 mg total) by mouth daily as needed.   insulin  glargine (1 Unit Dial ) 300 UNIT/ML Solostar Pen Commonly known as: TOUJEO  Inject 10 Units into the skin daily.   ipratropium-albuterol  0.5-2.5 (3) MG/3ML Soln Commonly known as: DUONEB USE 1 AMPULE IN NEBULIZER EVERY 6 HOURS AS NEEDED   losartan  50 MG tablet Commonly known as: COZAAR  Take 1 tablet (50 mg total) by mouth daily.   metFORMIN  500 MG 24 hr tablet Commonly known as: GLUCOPHAGE -XR TAKE 2 TABLETS BY MOUTH ONCE DAILY WITH BREAKFAST   metoprolol  succinate 50 MG 24 hr tablet Commonly known as: TOPROL -XL Take 1 tablet (50 mg total) by mouth daily. Take with or immediately following a meal. May take 1/2 tablet if BP is less than 120/80.   oxyCODONE -acetaminophen  7.5-325 MG tablet Commonly known as: PERCOCET Take 1 tablet by mouth 2 (two) times daily as needed for severe pain (pain score 7-10). What changed: Another medication with the same name was removed. Continue taking this medication, and follow the directions you see here.   OXYGEN  Inhale 4 L into the lungs. PT USES [facility] FOR OXYGEN    pantoprazole  40 MG tablet Commonly known as: Protonix  Take 1 tablet (40 mg total) by mouth daily.   potassium chloride  10 MEQ tablet Commonly known as: KLOR-CON  TAKE 1 TABLET BY MOUTH EVERY OTHER DAY   pregabalin  75 MG capsule Commonly known as: LYRICA  Take 1 capsule (75 mg total) by mouth 3 (three) times daily.   ReliOn Pen Needles 31G X 6 MM Misc Generic drug: Insulin  Pen Needle USE 1  pen needle with insulin  pen ONCE DAILY for diabetes   sulfamethoxazole -trimethoprim  400-80 MG tablet Commonly known as: BACTRIM  Take 1 tablet by mouth 3 (three) times a week.   vitamin B-12 100 MCG tablet Commonly  known as: CYANOCOBALAMIN  Take 100 mcg by mouth daily.   VITAMIN D  (CHOLECALCIFEROL ) PO Take 1 tablet by mouth in the morning.        Discharge Exam: Filed Weights   08/10/24 0328  Weight: 54.8 kg   GEN: NAD SKIN: Warm and dry EYES: No pallor or icterus ENT: MMM CV: RRR PULM: CTA B ABD: soft, ND, NT, +BS CNS: AAO x 3, non focal EXT: No edema or tenderness   Condition at discharge: good  The results of significant diagnostics from this hospitalization (including imaging, microbiology, ancillary and laboratory) are listed below for reference.   Imaging Studies: DG Chest Port 1 View Result Date: 08/10/2024 EXAM: 1 VIEW(S) XRAY OF THE CHEST 08/10/2024 03:38:00 AM COMPARISON: 04/21/2024 CLINICAL HISTORY: Questionable sepsis - evaluate for abnormality FINDINGS: LUNGS AND PLEURA: New right lower lung zone airspace opacity. Background emphysema. Possible trace right pleural effusion. Right upper lobe scarring. No pneumothorax. HEART AND MEDIASTINUM: Cardiac device noted. No acute abnormality of the cardiac and mediastinal silhouettes. BONES AND SOFT TISSUES:  No acute osseous abnormality. IMPRESSION: 1. Right lower lung airspace opacity most likely represents pneumonia. Follow up in 6 - 8 weeks after treatment is recommended to exclude underlying mass. Electronically signed by: Norman Gatlin MD 08/10/2024 03:50 AM EST RP Workstation: [id number]    Microbiology: Results for orders placed or performed during the hospital encounter of 08/10/24  Resp panel by RT-PCR (RSV, Flu A&B, Covid) Anterior Nasal Swab     Status: None   Collection Time: 08/10/24  3:42 AM   Specimen: Anterior Nasal Swab  Result Value Ref Range Status   SARS Coronavirus 2 by RT PCR NEGATIVE NEGATIVE Final    Comment: (NOTE) SARS-CoV-2 target nucleic acids are NOT DETECTED.  The SARS-CoV-2 RNA is generally detectable in upper respiratory specimens during the acute phase of infection. The lowest concentration of  SARS-CoV-2 viral copies this assay can detect is 138 copies/mL. A negative result does not preclude SARS-Cov-2 infection and should not be used as the sole basis for treatment or other patient management decisions. A negative result may occur with  improper specimen collection/handling, submission of specimen other than nasopharyngeal swab, presence of viral mutation(s) within the areas targeted by this assay, and inadequate number of viral copies(<138 copies/mL). A negative result must be combined with clinical observations, patient history, and epidemiological information. The expected result is Negative.  Fact Sheet for Patients:  bloggercourse.com  Fact Sheet for Healthcare Providers:  seriousbroker.it  This test is no t yet approved or cleared by the [place]  FDA and  has been authorized for detection and/or diagnosis of SARS-CoV-2 by FDA under an Emergency Use Authorization (EUA). This EUA will remain  in effect (meaning this test can be used) for the duration of the COVID-19 declaration under Section 564(b)(1) of the Act, 21 U.S.C.section 360bbb-3(b)(1), unless the authorization is terminated  or revoked sooner.       Influenza A by PCR NEGATIVE NEGATIVE Final   Influenza B by PCR NEGATIVE NEGATIVE Final    Comment: (NOTE) The Xpert Xpress SARS-CoV-2/FLU/RSV plus assay is intended as an aid in the diagnosis of influenza from Nasopharyngeal swab specimens and should not be used as a sole basis for treatment. Nasal washings and aspirates are unacceptable for Xpert Xpress SARS-CoV-2/FLU/RSV testing.  Fact Sheet for Patients: bloggercourse.com  Fact Sheet for Healthcare Providers: seriousbroker.it  This test is not yet approved or cleared by the [place]  FDA and has been authorized for detection and/or diagnosis of SARS-CoV-2 by FDA under an Emergency Use  Authorization (EUA). This EUA will remain in effect (meaning this test can be used) for the duration of the COVID-19 declaration under Section 564(b)(1) of the Act, 21 U.S.C. section 360bbb-3(b)(1), unless the authorization is terminated or revoked.     Resp Syncytial Virus by PCR NEGATIVE NEGATIVE Final    Comment: (NOTE) Fact Sheet for Patients: bloggercourse.com  Fact Sheet for Healthcare Providers: seriousbroker.it  This test is not yet approved or cleared by the [place]  FDA and has been authorized for detection and/or diagnosis of SARS-CoV-2 by FDA under an Emergency Use Authorization (EUA). This EUA will remain in effect (meaning this test can be used) for the duration of the COVID-19 declaration under Section 564(b)(1) of the Act, 21 U.S.C. section 360bbb-3(b)(1), unless the authorization is terminated or revoked.  Performed at [facility], [address] [place], [place] [address]   Blood Culture (routine x 2)     Status: None   Collection Time: 08/10/24  4:36 AM  Specimen: BLOOD  Result Value Ref Range Status   Specimen Description BLOOD BLOOD RIGHT ARM  Final   Special Requests   Final    BOTTLES DRAWN AEROBIC AND ANAEROBIC Blood Culture adequate volume   Culture   Final    NO GROWTH 5 DAYS Performed at [facility], [address] [place], [place] [address]    Report Status 08/15/2024 FINAL  Final  Blood Culture (routine x 2)     Status: None   Collection Time: 08/10/24  4:36 AM   Specimen: BLOOD  Result Value Ref Range Status   Specimen Description BLOOD BLOOD LEFT ARM  Final   Special Requests   Final    BOTTLES DRAWN AEROBIC AND ANAEROBIC Blood Culture adequate volume   Culture   Final    NO GROWTH 5 DAYS Performed at [facility], [address], [place], [place] [address]    Report Status 08/15/2024 FINAL  Final  MRSA Next Gen by PCR, Nasal     Status: None    Collection Time: 08/12/24  8:21 AM   Specimen: Nasal Mucosa; Nasal Swab  Result Value Ref Range Status   MRSA by PCR Next Gen NOT DETECTED NOT DETECTED Final    Comment: (NOTE) The GeneXpert MRSA Assay (FDA approved for NASAL specimens only), is one component of a comprehensive MRSA colonization surveillance program. It is not intended to diagnose MRSA infection nor to guide or monitor treatment for MRSA infections. Test performance is not FDA approved in patients less than 42 years old. Performed at [facility], [address] [place], [place] [address]    *Note: Due to a large number of results and/or encounters for the requested time period, some results have not been displayed. A complete set of results can be found in Results Review.    Labs: CBC: Recent Labs  Lab 08/11/24 1126 08/12/24 0353 08/13/24 0410  WBC 12.7* 8.8 7.7  NEUTROABS 11.7* 6.3 5.6  HGB 9.7* 9.3* 9.5*  HCT 32.6* 32.0* 32.9*  MCV 88.6 90.7 91.6  PLT 211 205 215   Basic Metabolic Panel: Recent Labs  Lab 08/11/24 1126 08/12/24 0353 08/13/24 0410  NA 137 141 142  K 4.3 3.4* 4.4  CL 100 101 104  CO2 31 34* 33*  GLUCOSE 371* 116* 156*  BUN 20 21 21   CREATININE 0.60 0.59 0.50  CALCIUM  8.5* 9.1 9.3   Liver Function Tests: No results for input(s): AST, ALT, ALKPHOS, BILITOT, PROT, ALBUMIN in the last 168 hours. CBG: Recent Labs  Lab 08/17/24 1621 08/17/24 2015 08/18/24 0041 08/18/24 0352 08/18/24 0746  GLUCAP 128* 167* 112* 103* 108*    Discharge time spent: greater than 30 minutes.  Signed: AIDA CHO, MD [facility] 08/18/2024 "

## 2024-08-18 NOTE — Plan of Care (Signed)

## 2024-08-18 NOTE — Progress Notes (Signed)
 This RN provided discharge instructions and teaching to the patient and the patient's significant other. Both parties verbalized and demonstrated understanding of the provided instructions. All outstanding questions resolved. L arm PIV removed. IV catheter intact. Pt tolerated well. All belongings packed and in tow.

## 2024-08-18 NOTE — TOC Transition Note (Signed)
 Transition of Care [facility]) - Discharge Note   Patient Details  Name: Angela Jensen MRN: [id number] Date of Birth: [birth year]  Transition of Care [facility] CM/SW Contact:  Lauraine JAYSON Carpen, LCSW Phone Number: 08/18/2024, 9:20 AM   Clinical Narrative:   Patient has orders to discharge home today. No further concerns. CSW signing off.  Final next level of care: Home/Self Care Barriers to Discharge: Barriers Resolved   Patient Goals and CMS Choice            Discharge Placement                Patient to be transferred to facility by: Family   Patient and family notified of of transfer: 08/18/24  Discharge Plan and Services Additional resources added to the After Visit Summary for     Discharge Planning Services: CM Consult                                 Social Drivers of Health (SDOH) Interventions SDOH Screenings   Food Insecurity: Patient Declined (08/11/2024)  Housing: Low Risk (08/11/2024)  Transportation Needs: No Transportation Needs (08/11/2024)  Utilities: Not At Risk (08/11/2024)  Alcohol Screen: Low Risk (12/11/2021)  Depression (PHQ2-9): Medium Risk (04/13/2024)  Financial Resource Strain: Medium Risk (06/04/2024)   Received from [facility]  Social Connections: Moderately Integrated (08/11/2024)  Tobacco Use: Medium Risk (08/10/2024)     Readmission Risk Interventions    08/11/2024   11:06 AM 04/19/2024   12:06 PM 03/21/2024    3:56 PM  Readmission Risk Prevention Plan  Transportation Screening Complete Complete Complete  HRI or Home Care Consult   Complete  Social Work Consult for Recovery Care Planning/Counseling   Complete  Palliative Care Screening   Not Applicable  Medication Review Oceanographer) Complete Complete Complete  PCP or Specialist appointment within 3-5 days of discharge Complete Complete   HRI or Home Care Consult Complete    SW Recovery Care/Counseling Consult Complete Complete   Palliative Care Screening Not  Applicable Not Applicable   Skilled Nursing Facility Not Applicable Not Applicable

## 2024-08-19 ENCOUNTER — Other Ambulatory Visit: Payer: Self-pay | Admitting: Nurse Practitioner

## 2024-08-19 DIAGNOSIS — L89312 Pressure ulcer of right buttock, stage 2: Secondary | ICD-10-CM

## 2024-08-21 ENCOUNTER — Encounter: Attending: Pulmonary Disease

## 2024-08-21 DIAGNOSIS — J449 Chronic obstructive pulmonary disease, unspecified: Secondary | ICD-10-CM | POA: Insufficient documentation

## 2024-08-23 ENCOUNTER — Telehealth: Payer: Self-pay

## 2024-08-23 ENCOUNTER — Encounter

## 2024-08-23 NOTE — Progress Notes (Signed)
..  Complex Care Management   08/23/2024  Name: BRISTAL STEFFY  MRN: [id number]  DOB: 1956/01/27  Inbound call received from [name] Brain after receiving an Interactive Voice Response (IVR) call after a recent hospitalization. Information was provided about Care Management services.  Follow up plan: No further follow up required   Leita Lyme, [facility]  [facility], [facility] Health Care Management Assistant [phone]

## 2024-08-26 ENCOUNTER — Other Ambulatory Visit: Payer: Self-pay | Admitting: Nurse Practitioner

## 2024-08-26 DIAGNOSIS — F419 Anxiety disorder, unspecified: Secondary | ICD-10-CM

## 2024-08-28 ENCOUNTER — Encounter

## 2024-08-28 ENCOUNTER — Inpatient Hospital Stay: Admitting: Nurse Practitioner

## 2024-08-29 ENCOUNTER — Telehealth: Payer: Self-pay

## 2024-08-29 DIAGNOSIS — M79605 Pain in left leg: Secondary | ICD-10-CM

## 2024-08-29 DIAGNOSIS — I739 Peripheral vascular disease, unspecified: Secondary | ICD-10-CM

## 2024-08-30 ENCOUNTER — Encounter: Payer: Self-pay | Admitting: *Deleted

## 2024-08-30 ENCOUNTER — Encounter

## 2024-08-30 DIAGNOSIS — J449 Chronic obstructive pulmonary disease, unspecified: Secondary | ICD-10-CM | POA: Diagnosis present

## 2024-08-30 MED ORDER — OXYCODONE-ACETAMINOPHEN 7.5-325 MG PO TABS: 1.0000 | ORAL_TABLET | Freq: Two times a day (BID) | ORAL | 0 refills | Status: AC | PRN

## 2024-08-30 NOTE — Progress Notes (Signed)
 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 (HCC)  Patient's Home Medications on Admission: Current Medications[1]  Past Medical History: Past Medical History:  Diagnosis Date   Anemia    Arthritis    Asthma    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   Cancer (HCC) 2012   Right Lung CA   COPD (chronic obstructive pulmonary disease) (HCC)    Depression    Diabetes mellitus without complication (HCC)    Patient takes Janumet   Essential hypertension 05/26/2016   Heart failure (HCC) 2022   Hydropneumothorax 05/03/2020   Hypercholesteremia    Oxygen  dependent    2L at nite    PAD (peripheral artery disease) 06/22/2016   Peripheral vascular disease    Personal history of radiation therapy    Shortness of breath dyspnea    with exertion    Sialolithiasis    Sleep apnea    Wears dentures    full upper and lower    Tobacco Use: Tobacco Use History[2]  Labs: Review Flowsheet  More data exists      Latest Ref Rng & Units 04/09/2024 04/18/2024 04/21/2024 08/10/2024 08/11/2024  Labs for ITP Cardiac and Pulmonary Rehab  Hemoglobin A1c 4.8 - 5.6 % - 7.2  - - 11.0   Bicarbonate 20.0 - 28.0 mmol/L 29.9  38.0  37.2  32.7  32.4  -  O2 Saturation % 99.1  88.7  59.6  72.5  -    Details       Multiple values from one day are sorted in reverse-chronological order          Pulmonary Assessment Scores:  Pulmonary Assessment Scores     Row Name 04/13/24 1624         ADL UCSD   ADL Phase Entry     SOB Score total 71      Rest 0     Walk 3     Stairs 4     Bath 4     Dress 3     Shop 3       CAT Score   CAT Score 26       mMRC Score   mMRC Score 2        UCSD: Self-administered rating of dyspnea associated with activities of daily living (ADLs) 6-point scale (0 = not at all to 5 = maximal or unable to do because of breathlessness)  Scoring Scores range from 0 to 120.  Minimally important difference is 5 units  CAT: CAT can identify the health impairment of COPD patients and is better correlated with disease progression.  CAT has a scoring range of zero to 40. The CAT score is classified into four groups of low (less than 10), medium (10 - 20), high (21-30) and very high (31-40) based on the impact level of disease on health status. A CAT score over 10 suggests significant symptoms.  A worsening CAT score could be explained by an exacerbation, poor medication adherence, poor inhaler technique, or progression of COPD or comorbid conditions.  CAT MCID is 2  points  mMRC: mMRC (Modified Medical Research Council) Dyspnea Scale is used to assess the degree of baseline functional disability in patients of respiratory disease due to dyspnea. No minimal important difference is established. A decrease in score of 1 point or greater is considered a positive change.   Pulmonary Function Assessment:   Exercise Target Goals: Exercise Program Goal: Individual exercise prescription set using results from initial 6 min walk test and THRR while considering  patients activity barriers and safety.   Exercise Prescription Goal: Initial exercise prescription builds to 30-45 minutes a day of aerobic activity, 2-3 days per week.  Home exercise guidelines will be given to patient during program as part of exercise prescription that the participant will acknowledge.  Education: Aerobic Exercise: - Group verbal and visual presentation on the components of exercise prescription. Introduces F.I.T.T principle from  ACSM for exercise prescriptions.  Reviews F.I.T.T. principles of aerobic exercise including progression. Written material provided at class time.   Education: Resistance Exercise: - Group verbal and visual presentation on the components of exercise prescription. Introduces F.I.T.T principle from ACSM for exercise prescriptions  Reviews F.I.T.T. principles of resistance exercise including progression. Written material provided at class time.    Education: Exercise & Equipment Safety: - Individual verbal instruction and demonstration of equipment use and safety with use of the equipment. Flowsheet Row Pulmonary Rehab from 04/13/2024 in [facility] Cardiac and Pulmonary Rehab  Date 04/13/24  Educator MB  Instruction Review Code 1- Verbalizes Understanding    Education: Exercise Physiology & General Exercise Guidelines: - Group verbal and written instruction with models to review the exercise physiology of the cardiovascular system and associated critical values. Provides general exercise guidelines with specific guidelines to those with heart or lung disease.    Education: Flexibility, Balance, Mind/Body Relaxation: - Group verbal and visual presentation with interactive activity on the components of exercise prescription. Introduces F.I.T.T principle from ACSM for exercise prescriptions. Reviews F.I.T.T. principles of flexibility and balance exercise training including progression. Also discusses the mind body connection.  Reviews various relaxation techniques to help reduce and manage stress (i.e. Deep breathing, progressive muscle relaxation, and visualization). Balance handout provided to take home. Written material provided at class time.   Activity Barriers & Risk Stratification:  Activity Barriers & Cardiac Risk Stratification - 04/13/24 1620       Activity Barriers & Cardiac Risk Stratification   Activity Barriers Joint Problems;Balance Concerns;Other (comment);Assistive Device    Comments  recent stent in L leg causing numbness          6 Minute Walk:  6 Minute Walk     Row Name 04/13/24 1615         6 Minute Walk   Phase Initial     Distance 620 feet     Walk Time 6 minutes     # of Rest Breaks 0     MPH 1.17     METS 1.97     RPE 11     Perceived Dyspnea  0     VO2 Peak 6.89     Symptoms No     Resting HR 78 bpm     Resting BP 106/60     Resting Oxygen  Saturation  92 %     Exercise Oxygen  Saturation  during 6 min walk 86 %     Max Ex. HR 96 bpm     Max Ex. BP 148/72     2 Minute Post BP 132/72  Interval HR   1 Minute HR 94     2 Minute HR 94     3 Minute HR 94     4 Minute HR 96     5 Minute HR 90     6 Minute HR 95     2 Minute Post HR 76     Interval Heart Rate? Yes       Interval Oxygen    Interval Oxygen ? Yes     Baseline Oxygen  Saturation % 92 %     1 Minute Oxygen  Saturation % 91 %     1 Minute Liters of Oxygen  4 L     2 Minute Oxygen  Saturation % 87 %     2 Minute Liters of Oxygen  4 L     3 Minute Oxygen  Saturation % 86 %     3 Minute Liters of Oxygen  4 L     4 Minute Oxygen  Saturation % 88 %     4 Minute Liters of Oxygen  4 L     5 Minute Oxygen  Saturation % 87 %     5 Minute Liters of Oxygen  4 L     6 Minute Oxygen  Saturation % 87 %     6 Minute Liters of Oxygen  4 L     2 Minute Post Oxygen  Saturation % 95 %     2 Minute Post Liters of Oxygen  4 L       Oxygen  Initial Assessment:  Oxygen  Initial Assessment - 04/07/24 1308       Home Oxygen    Home Oxygen  Device Home Concentrator    Sleep Oxygen  Prescription Continuous    Liters per minute 3.5    Home Exercise Oxygen  Prescription Continuous    Liters per minute 4    Home Resting Oxygen  Prescription Continuous    Liters per minute 4    Compliance with Home Oxygen  Use Yes      Intervention   Short Term Goals To learn and exhibit compliance with exercise, home and travel O2 prescription;To learn and understand importance of monitoring SPO2 with pulse oximeter and  demonstrate accurate use of the pulse oximeter.;To learn and understand importance of maintaining oxygen  saturations>88%;To learn and demonstrate proper pursed lip breathing techniques or other breathing techniques. ;To learn and demonstrate proper use of respiratory medications    Long  Term Goals Verbalizes importance of monitoring SPO2 with pulse oximeter and return demonstration;Exhibits compliance with exercise, home  and travel O2 prescription;Maintenance of O2 saturations>88%;Exhibits proper breathing techniques, such as pursed lip breathing or other method taught during program session;Compliance with respiratory medication;Demonstrates proper use of MDIs          Oxygen  Re-Evaluation:  Oxygen  Re-Evaluation     Row Name 05/01/24 1356             Goals/Expected Outcomes   Comments Reviewed PLB technique with pt.  Talked about how it works and it's importance in maintaining their exercise saturations.       Goals/Expected Outcomes Short: Become more profiecient at using PLB.   Long: Become independent at using PLB.          Oxygen  Discharge (Final Oxygen  Re-Evaluation):  Oxygen  Re-Evaluation - 05/01/24 1356       Goals/Expected Outcomes   Comments Reviewed PLB technique with pt.  Talked about how it works and it's importance in maintaining their exercise saturations.    Goals/Expected Outcomes Short: Become more profiecient at using PLB.   Long: Become independent at  using PLB.          Initial Exercise Prescription:  Initial Exercise Prescription - 04/13/24 1600       Date of Initial Exercise RX and Referring Provider   Date 04/13/24    Referring Provider Parris Manna, MD      Oxygen    Oxygen  Continuous    Liters 4    Maintain Oxygen  Saturation 88% or higher      Recumbant Bike   Level 1    RPM 50    Watts 15    Minutes 15    METs 1.97      NuStep   Level 1    SPM 80    Minutes 15    METs 1.97      Track   Laps 17    Minutes 15    METs 1.92       Prescription Details   Frequency (times per week) 2    Duration Progress to 30 minutes of continuous aerobic without signs/symptoms of physical distress      Intensity   THRR 40-80% of Max Heartrate 107-137    Ratings of Perceived Exertion 11-13    Perceived Dyspnea 0-4      Progression   Progression Continue to progress workloads to maintain intensity without signs/symptoms of physical distress.      Resistance Training   Training Prescription Yes    Weight 3 lb    Reps 10-15          Perform Capillary Blood Glucose checks as needed.  Exercise Prescription Changes:   Exercise Prescription Changes     Row Name 04/13/24 1600 05/18/24 1300 05/30/24 1400 06/29/24 1100 07/10/24 1000     Response to Exercise   Blood Pressure (Admit) 106/60 118/66 178/86 132/74 122/64   Blood Pressure (Exercise) 148/72 150/90 186/78 154/74 130/80   Blood Pressure (Exit) 132/72 100/50 146/82 142/72 120/70   Heart Rate (Admit) 78 bpm 102 bpm 93 bpm 80 bpm 95 bpm   Heart Rate (Exercise) 96 bpm 111 bpm 113 bpm 105 bpm 109 bpm   Heart Rate (Exit) 76 bpm 103 bpm 86 bpm 109 bpm 98 bpm   Oxygen  Saturation (Admit) 92 % 87 % 93 % 94 % 96 %   Oxygen  Saturation (Exercise) 86 % 84 %  came up to 88% with rest and increase to 4 L 84 %  came up to 88% with rest and increase to 4 L 86 %  came up to 88% with rest 86 %  came up to 88% with rest   Oxygen  Saturation (Exit) 95 % 87 % 92 % 93 % 91 %   Rating of Perceived Exertion (Exercise) 11 14 15 15 13    Perceived Dyspnea (Exercise) 0 1 0 1 0   Symptoms none none none none none   Comments results 1st week of exercise sessions -- -- --   Duration -- Progress to 30 minutes of  aerobic without signs/symptoms of physical distress Continue with 30 min of aerobic exercise without signs/symptoms of physical distress. Continue with 30 min of aerobic exercise without signs/symptoms of physical distress. Continue with 30 min of aerobic exercise without  signs/symptoms of physical distress.   Intensity -- THRR unchanged THRR unchanged THRR unchanged THRR unchanged     Progression   Progression -- Continue to progress workloads to maintain intensity without signs/symptoms of physical distress. Continue to progress workloads to maintain intensity without signs/symptoms of physical distress. Continue to progress  workloads to maintain intensity without signs/symptoms of physical distress. Continue to progress workloads to maintain intensity without signs/symptoms of physical distress.   Average METs 1.97 1.58 1.88 1.96 1.79     Resistance Training   Training Prescription -- Yes Yes Yes Yes   Weight -- 3 lb 3 lb 3 lb 3 lb   Reps -- 10-15 10-15 10-15 10-15     Interval Training   Interval Training -- No No No No     Oxygen    Oxygen  -- Continuous Continuous Continuous Continuous   Liters -- 3-4 3-4 6 L 6 L     Treadmill   MPH -- -- 0.7 -- --   Grade -- -- 0 -- --   Minutes -- -- 15 -- --   METs -- -- 1.54 -- --     Recumbant Bike   Level -- 1 1 2  --   Watts -- 15 15 11  --   Minutes -- 15 15 15  --   METs -- 2.85 2.89 2.64 --     NuStep   Level -- 2 1 1 2    Minutes -- 15 15 30 30    METs -- 1.5 1.6 2.1 1.8     Track   Laps -- 10 -- -- 16   Minutes -- 15 -- -- 15   METs -- 1.54 -- -- 1.87     Oxygen    Maintain Oxygen  Saturation -- 88% or higher 88% or higher 88% or higher 88% or higher    Row Name 07/27/24 1400 08/28/24 1100           Response to Exercise   Blood Pressure (Admit) 100/62 130/68      Blood Pressure (Exit) 108/64 126/68      Heart Rate (Admit) 103 bpm 100 bpm      Heart Rate (Exercise) 115 bpm 111 bpm      Heart Rate (Exit) 109 bpm 102 bpm      Oxygen  Saturation (Admit) 90 % 92 %      Oxygen  Saturation (Exercise) 85 % 90 %      Oxygen  Saturation (Exit) 89 % 94 %      Rating of Perceived Exertion (Exercise) 13 8      Perceived Dyspnea (Exercise) 0 0      Symptoms none none      Duration Continue with 30  min of aerobic exercise without signs/symptoms of physical distress. Continue with 30 min of aerobic exercise without signs/symptoms of physical distress.      Intensity THRR unchanged THRR unchanged        Progression   Progression Continue to progress workloads to maintain intensity without signs/symptoms of physical distress. Continue to progress workloads to maintain intensity without signs/symptoms of physical distress.      Average METs 1.83 1.78        Resistance Training   Training Prescription Yes --      Weight 3 lb 3 lb      Reps 10-15 10-15        Interval Training   Interval Training No No        Oxygen    Oxygen  Continuous Continuous      Liters 4-6L 6L        Recumbant Bike   Level -- 1      Watts -- 9      Minutes -- 15      METs -- 2.52        NuStep  Level 2 1      Minutes 15 15      METs 2.1 1.4        Biostep-RELP   Level 1 --      Minutes 15 --      METs 1 --        Track   Laps 30 --      Minutes 15 --      METs 3.63 --        Oxygen    Maintain Oxygen  Saturation 88% or higher 88% or higher         Exercise Comments:   Exercise Comments     Row Name 05/01/24 1352           Exercise Comments First full day of exercise!  Patient was oriented to gym and equipment including functions, settings, policies, and procedures.  Patient's individual exercise prescription and treatment plan were reviewed.  All starting workloads were established based on the results of the 6 minute walk test done at initial orientation visit.  The plan for exercise progression was also introduced and progression will be customized based on patient's performance and goals.          Exercise Goals and Review:   Exercise Goals     Row Name 04/13/24 1623             Exercise Goals   Increase Physical Activity Yes       Intervention Provide advice, education, support and counseling about physical activity/exercise needs.;Develop an individualized exercise  prescription for aerobic and resistive training based on initial evaluation findings, risk stratification, comorbidities and participant's personal goals.       Expected Outcomes Short Term: Attend rehab on a regular basis to increase amount of physical activity.;Long Term: Add in home exercise to make exercise part of routine and to increase amount of physical activity.;Long Term: Exercising regularly at least 3-5 days a week.       Increase Strength and Stamina Yes       Intervention Provide advice, education, support and counseling about physical activity/exercise needs.;Develop an individualized exercise prescription for aerobic and resistive training based on initial evaluation findings, risk stratification, comorbidities and participant's personal goals.       Expected Outcomes Short Term: Increase workloads from initial exercise prescription for resistance, speed, and METs.;Short Term: Perform resistance training exercises routinely during rehab and add in resistance training at home;Long Term: Improve cardiorespiratory fitness, muscular endurance and strength as measured by increased METs and functional capacity ( )       Able to understand and use rate of perceived exertion (RPE) scale Yes       Intervention Provide education and explanation on how to use RPE scale       Expected Outcomes Short Term: Able to use RPE daily in rehab to express subjective intensity level;Long Term:  Able to use RPE to guide intensity level when exercising independently       Able to understand and use Dyspnea scale Yes       Intervention Provide education and explanation on how to use Dyspnea scale       Expected Outcomes Long Term: Able to use Dyspnea scale to guide intensity level when exercising independently;Short Term: Able to use Dyspnea scale daily in rehab to express subjective sense of shortness of breath during exertion       Knowledge and understanding of Target Heart Rate Range (THRR) Yes  Intervention Provide education and explanation of THRR including how the numbers were predicted and where they are located for reference       Expected Outcomes Short Term: Able to state/look up THRR;Short Term: Able to use daily as guideline for intensity in rehab;Long Term: Able to use THRR to govern intensity when exercising independently       Able to check pulse independently Yes       Intervention Provide education and demonstration on how to check pulse in carotid and radial arteries.;Review the importance of being able to check your own pulse for safety during independent exercise       Expected Outcomes Short Term: Able to explain why pulse checking is important during independent exercise;Long Term: Able to check pulse independently and accurately       Understanding of Exercise Prescription Yes       Intervention Provide education, explanation, and written materials on patient's individual exercise prescription       Expected Outcomes Short Term: Able to explain program exercise prescription;Long Term: Able to explain home exercise prescription to exercise independently          Exercise Goals Re-Evaluation :  Exercise Goals Re-Evaluation     Row Name 05/01/24 1353 05/18/24 1347 05/30/24 1410 06/15/24 1136 06/29/24 1110     Exercise Goal Re-Evaluation   Exercise Goals Review Increase Physical Activity;Able to understand and use rate of perceived exertion (RPE) scale;Knowledge and understanding of Target Heart Rate Range (THRR);Understanding of Exercise Prescription;Increase Strength and Stamina;Able to understand and use Dyspnea scale;Able to check pulse independently Increase Physical Activity;Understanding of Exercise Prescription;Increase Strength and Stamina Increase Physical Activity;Understanding of Exercise Prescription;Increase Strength and Stamina Increase Physical Activity;Understanding of Exercise Prescription;Increase Strength and Stamina Increase Physical  Activity;Understanding of Exercise Prescription;Increase Strength and Stamina   Comments Reviewed RPE and dyspnea scale, THR and program prescription with pt today.  Pt voiced understanding and was given a copy of goals to take home. Dakari is off to a good start in the program. She completed her first 2 weeks in this review. She was able to work at level 2 on the T4 nustep, level 1 on the recumbent bike, and walk 10 laps on the track. We will continue to monitor her progress in the program. Leiann has only attended two sessions since the last review. She continues to do well at level 1 on both the recumbent bike and T4 nustep. She also began using the treadmill at a speed of 0.7 mph with no incline. We will continue to monitor her progress in the program. Latresha did not attend rehab since the last review. She has since returned to rehab for one session. We will continue to monitor her progress in the program. Klee is doing well in rehab. She increased to level 2 on the recumbent bike. She maintained level 1 on the T4 nustep for 30 minutes. We will continue to monitor her progress in the program.   Expected Outcomes Short: Use RPE daily to regulate intensity.  Long: Follow program prescription in THR. Short: Continue to follow current exercise prescription. Long: Continue exercise to improve strength and stamina. Short: Attend rehab more consistently. Long: Continue exercise to improve strength and stamina. Short: Attend rehab more consistently. Long: Continue exercise to improve strength and stamina. Short: Try level 2 on the T4 nustep. Long: Continue exercise to improve strength and stamina.    Row Name 07/10/24 1048 07/27/24 1416 08/15/24 1505 08/28/24 1144  Exercise Goal Re-Evaluation   Exercise Goals Review Increase Physical Activity;Understanding of Exercise Prescription;Increase Strength and Stamina Increase Physical Activity;Understanding of Exercise Prescription;Increase Strength and Stamina Increase  Physical Activity;Understanding of Exercise Prescription;Increase Strength and Stamina Increase Physical Activity;Understanding of Exercise Prescription;Increase Strength and Stamina    Comments Saydi is doing well in rehab. She was able to walk the track in this review and increased to 16 laps. She also increased to level 2 on the T4 nustep. We will continue to monitor her progress in the program. Chauntae continues to do well in the program. She has recently been able to increase from 16 to 30 laps on the track in 15 minutes. She was also able to maintain level 2 on the T4 nustep, and add the biostep at level 1 to her prescription. We will continue to monitor her progress in the program. Idamae did not attend rehab during the last review period. She has since returned to rehab for one session on 08/07/2024. We will encourage her to attend rehab more consistently and will continue to monitor her progress in the program. Micala was able to attend one session during this review period. During her session she was able to use the recumbent bike at level 1, and the T4 nustep at level 1. We will continue to monitor her progress in the program.    Expected Outcomes Short: Continue to progressively increase nustep workload. Long: Continue exercise to improve strength and stamina. Short: Continue to progressively increase nustep workload. Long: Continue exercise to improve strength and stamina. Short: Attend rehab more consistently. Long: Continue exercise to improve strength and stamina. Short: Attend rehab more consistently. Long: Continue exercise to improve strength and stamina.       Discharge Exercise Prescription (Final Exercise Prescription Changes):  Exercise Prescription Changes - 08/28/24 1100       Response to Exercise   Blood Pressure (Admit) 130/68    Blood Pressure (Exit) 126/68    Heart Rate (Admit) 100 bpm    Heart Rate (Exercise) 111 bpm    Heart Rate (Exit) 102 bpm    Oxygen  Saturation (Admit) 92 %     Oxygen  Saturation (Exercise) 90 %    Oxygen  Saturation (Exit) 94 %    Rating of Perceived Exertion (Exercise) 8    Perceived Dyspnea (Exercise) 0    Symptoms none    Duration Continue with 30 min of aerobic exercise without signs/symptoms of physical distress.    Intensity THRR unchanged      Progression   Progression Continue to progress workloads to maintain intensity without signs/symptoms of physical distress.    Average METs 1.78      Resistance Training   Weight 3 lb    Reps 10-15      Interval Training   Interval Training No      Oxygen    Oxygen  Continuous    Liters 6L      Recumbant Bike   Level 1    Watts 9    Minutes 15    METs 2.52      NuStep   Level 1    Minutes 15    METs 1.4      Oxygen    Maintain Oxygen  Saturation 88% or higher          Nutrition:  Target Goals: Understanding of nutrition guidelines, daily intake of sodium 1500mg , cholesterol 200mg , calories 30% from fat and 7% or less from saturated fats, daily to have 5 or more servings of fruits  and vegetables.  Education: Nutrition 1 -Group instruction provided by verbal, written material, interactive activities, discussions, models, and posters to present general guidelines for heart healthy nutrition including macronutrients, label reading, and promoting whole foods over processed counterparts. Education serves as pensions consultant of discussion of heart healthy eating for all. Written material provided at class time.     Education: Nutrition 2 -Group instruction provided by verbal, written material, interactive activities, discussions, models, and posters to present general guidelines for heart healthy nutrition including sodium, cholesterol, and saturated fat. Providing guidance of habit forming to improve blood pressure, cholesterol, and body weight. Written material provided at class time.     Biometrics:  Pre Biometrics - 04/13/24 1623       Pre Biometrics   Height 4' 11.72 (1.517  m)    Weight 116 lb 3.2 oz (52.7 kg)    Waist Circumference 35 inches    Hip Circumference 37 inches    Waist to Hip Ratio 0.95 %    BMI (Calculated) 22.9    Single Leg Stand 21 seconds           Nutrition Therapy Plan and Nutrition Goals:  Nutrition Therapy & Goals - 04/13/24 1625       Nutrition Therapy   RD appointment deferred Yes      Personal Nutrition Goals   Nutrition Goal RD appointment deferred at this time      Intervention Plan   Intervention Prescribe, educate and counsel regarding individualized specific dietary modifications aiming towards targeted core components such as weight, hypertension, lipid management, diabetes, heart failure and other comorbidities.    Expected Outcomes Short Term Goal: Understand basic principles of dietary content, such as calories, fat, sodium, cholesterol and nutrients.          Nutrition Assessments:  MEDIFICTS Score Key: >=70 Need to make dietary changes  40-70 Heart Healthy Diet <= 40 Therapeutic Level Cholesterol Diet  Flowsheet Row Pulmonary Rehab from 04/13/2024 in [facility] Cardiac and Pulmonary Rehab  Picture Your Plate Total Score on Admission 61   Picture Your Plate Scores: <59 Unhealthy dietary pattern with much room for improvement. 41-50 Dietary pattern unlikely to meet recommendations for good health and room for improvement. 51-60 More healthful dietary pattern, with some room for improvement.  >60 Healthy dietary pattern, although there may be some specific behaviors that could be improved.   Nutrition Goals Re-Evaluation:   Nutrition Goals Discharge (Final Nutrition Goals Re-Evaluation):   Psychosocial: Target Goals: Acknowledge presence or absence of significant depression and/or stress, maximize coping skills, provide positive support system. Participant is able to verbalize types and ability to use techniques and skills needed for reducing stress and depression.   Education: Stress, Anxiety, and  Depression - Group verbal and visual presentation to define topics covered.  Reviews how body is impacted by stress, anxiety, and depression.  Also discusses healthy ways to reduce stress and to treat/manage anxiety and depression.  Written material provided at class time.   Education: Sleep Hygiene -Provides group verbal and written instruction about how sleep can affect your health.  Define sleep hygiene, discuss sleep cycles and impact of sleep habits. Review good sleep hygiene tips.    Initial Review & Psychosocial Screening:  Initial Psych Review & Screening - 04/07/24 1310       Initial Review   Current issues with Current Stress Concerns;Current Anxiety/Panic    Source of Stress Concerns Chronic Illness      Family Dynamics   Good Support System?  Yes      Barriers   Psychosocial barriers to participate in program There are no identifiable barriers or psychosocial needs.      Screening Interventions   Interventions Encouraged to exercise;Provide feedback about the scores to participant    Expected Outcomes Short Term goal: Utilizing psychosocial counselor, staff and physician to assist with identification of specific Stressors or current issues interfering with healing process. Setting desired goal for each stressor or current issue identified.;Long Term Goal: Stressors or current issues are controlled or eliminated.;Short Term goal: Identification and review with participant of any Quality of Life or Depression concerns found by scoring the questionnaire.;Long Term goal: The participant improves quality of Life and PHQ9 Scores as seen by post scores and/or verbalization of changes          Quality of Life Scores:  Scores of 19 and below usually indicate a poorer quality of life in these areas.  A difference of  2-3 points is a clinically meaningful difference.  A difference of 2-3 points in the total score of the Quality of Life Index has been associated with significant  improvement in overall quality of life, self-image, physical symptoms, and general health in studies assessing change in quality of life.  PHQ-9: Review Flowsheet  More data exists      04/13/2024 03/28/2024 10/13/2023 08/12/2023 10/08/2022  Depression screen PHQ 2/9  Decreased Interest 0 0 0 0 0  Down, Depressed, Hopeless 1 0 0 0 0  PHQ - 2 Score 1 0 0 0 0  Altered sleeping 3 - - - -  Tired, decreased energy 2 - - - -  Change in appetite 3 - - - -  Feeling bad or failure about yourself  0 - - - -  Trouble concentrating 0 - - - -  Moving slowly or fidgety/restless 0 - - - -  Suicidal thoughts 0 - - - -  PHQ-9 Score 9  - - - -  Difficult doing work/chores Very difficult - - - -    Details       Data saved with a previous flowsheet row definition        Interpretation of Total Score  Total Score Depression Severity:  1-4 = Minimal depression, 5-9 = Mild depression, 10-14 = Moderate depression, 15-19 = Moderately severe depression, 20-27 = Severe depression   Psychosocial Evaluation and Intervention:  Psychosocial Evaluation - 04/07/24 1320       Psychosocial Evaluation & Interventions   Interventions Encouraged to exercise with the program and follow exercise prescription;Stress management education;Relaxation education    Comments Ms. Tenicia is coming to pulmonary rehab with COPD. She states her breathing difficulty depends on the day, but she uses her oxygen  consistently. She mentions her anxiety kicks in when she gets real short of breath and xanax  helps with that. Her stress is related to her disease process. She states she has a good support system and is looking forward to the program    Expected Outcomes Short: attend pulmonary rehab for education and exercise Long: develop and maintain positive self care habits    Continue Psychosocial Services  Follow up required by staff          Psychosocial Re-Evaluation:   Psychosocial Discharge (Final Psychosocial  Re-Evaluation):   Education: Education Goals: Education classes will be provided on a weekly basis, covering required topics. Participant will state understanding/return demonstration of topics presented.  Learning Barriers/Preferences:  Learning Barriers/Preferences - 04/07/24 1310  Learning Barriers/Preferences   Learning Barriers None    Learning Preferences Individual Instruction          General Pulmonary Education Topics:  Infection Prevention: - Provides verbal and written material to individual with discussion of infection control including proper hand washing and proper equipment cleaning during exercise session. Flowsheet Row Pulmonary Rehab from 04/13/2024 in [facility] Cardiac and Pulmonary Rehab  Date 04/13/24  Educator MB  Instruction Review Code 1- Verbalizes Understanding    Falls Prevention: - Provides verbal and written material to individual with discussion of falls prevention and safety. Flowsheet Row Pulmonary Rehab from 04/13/2024 in [facility] Cardiac and Pulmonary Rehab  Date 04/13/24  Educator MB  Instruction Review Code 1- Verbalizes Understanding    Chronic Lung Disease Review: - Group verbal instruction with posters, models, PowerPoint presentations and videos,  to review new updates, new respiratory medications, new advancements in procedures and treatments. Providing information on websites and 800 numbers for continued self-education. Includes information about supplement oxygen , available portable oxygen  systems, continuous and intermittent flow rates, oxygen  safety, concentrators, and Medicare reimbursement for oxygen . Explanation of Pulmonary Drugs, including class, frequency, complications, importance of spacers, rinsing mouth after steroid MDI's, and proper cleaning methods for nebulizers. Review of basic lung anatomy and physiology related to function, structure, and complications of lung disease. Review of risk factors. Discussion about methods for  diagnosing sleep apnea and types of masks and machines for OSA. Includes a review of the use of types of environmental controls: home humidity, furnaces, filters, dust mite/pet prevention, HEPA vacuums. Discussion about weather changes, air quality and the benefits of nasal washing. Instruction on Warning signs, infection symptoms, calling MD promptly, preventive modes, and value of vaccinations. Review of effective airway clearance, coughing and/or vibration techniques. Emphasizing that all should Create an Action Plan. Written material provided at class time. Flowsheet Row Pulmonary Rehab from 04/13/2024 in [facility] Cardiac and Pulmonary Rehab  Education need identified 04/13/24    AED/CPR: - Group verbal and written instruction with the use of models to demonstrate the basic use of the AED with the basic ABC's of resuscitation.    Tests and Procedures:  - Group verbal and visual presentation and models provide information about basic cardiac anatomy and function. Reviews the testing methods done to diagnose heart disease and the outcomes of the test results. Describes the treatment choices: Medical Management, Angioplasty, or Coronary Bypass Surgery for treating various heart conditions including Myocardial Infarction, Angina, Valve Disease, and Cardiac Arrhythmias.  Written material provided at class time.   Medication Safety: - Group verbal and visual instruction to review commonly prescribed medications for heart and lung disease. Reviews the medication, class of the drug, and side effects. Includes the steps to properly store meds and maintain the prescription regimen.  Written material given at graduation.   Other: -Provides group and verbal instruction on various topics (see comments)   Knowledge Questionnaire Score:  Knowledge Questionnaire Score - 04/13/24 1626       Knowledge Questionnaire Score   Pre Score 16/18           Core Components/Risk Factors/Patient Goals at  Admission:  Personal Goals and Risk Factors at Admission - 04/13/24 1626       Core Components/Risk Factors/Patient Goals on Admission    Weight Management Yes;Weight Maintenance    Intervention Weight Management: Develop a combined nutrition and exercise program designed to reach desired caloric intake, while maintaining appropriate intake of nutrient and fiber, sodium and fats, and appropriate energy  expenditure required for the weight goal.;Weight Management: Provide education and appropriate resources to help participant work on and attain dietary goals.;Weight Management/Obesity: Establish reasonable short term and long term weight goals.    Admit Weight 116 lb 3.2 oz (52.7 kg)    Goal Weight: Short Term 11 lb 3.2 oz (5.08 kg)    Goal Weight: Long Term 116 lb 3.2 oz (52.7 kg)    Expected Outcomes Short Term: Continue to assess and modify interventions until short term weight is achieved;Long Term: Adherence to nutrition and physical activity/exercise program aimed toward attainment of established weight goal;Weight Maintenance: Understanding of the daily nutrition guidelines, which includes 25-35% calories from fat, 7% or less cal from saturated fats, less than 200mg  cholesterol, less than 1.5gm of sodium, & 5 or more servings of fruits and vegetables daily;Understanding recommendations for meals to include 15-35% energy as protein, 25-35% energy from fat, 35-60% energy from carbohydrates, less than 200mg  of dietary cholesterol, 20-35 gm of total fiber daily;Understanding of distribution of calorie intake throughout the day with the consumption of 4-5 meals/snacks    Diabetes Yes    Intervention Provide education about signs/symptoms and action to take for hypo/hyperglycemia.;Provide education about proper nutrition, including hydration, and aerobic/resistive exercise prescription along with prescribed medications to achieve blood glucose in normal ranges: Fasting glucose 65-99 mg/dL    Expected  Outcomes Long Term: Attainment of HbA1C < 7%.;Short Term: Participant verbalizes understanding of the signs/symptoms and immediate care of hyper/hypoglycemia, proper foot care and importance of medication, aerobic/resistive exercise and nutrition plan for blood glucose control.    Hypertension Yes    Intervention Provide education on lifestyle modifcations including regular physical activity/exercise, weight management, moderate sodium restriction and increased consumption of fresh fruit, vegetables, and low fat dairy, alcohol moderation, and smoking cessation.;Monitor prescription use compliance.    Expected Outcomes Short Term: Continued assessment and intervention until BP is < 140/58mm HG in hypertensive participants. < 130/26mm HG in hypertensive participants with diabetes, heart failure or chronic kidney disease.;Long Term: Maintenance of blood pressure at goal levels.          Education:Diabetes - Individual verbal and written instruction to review signs/symptoms of diabetes, desired ranges of glucose level fasting, after meals and with exercise. Acknowledge that pre and post exercise glucose checks will be done for 3 sessions at entry of program. Flowsheet Row Pulmonary Rehab from 04/13/2024 in [facility] Cardiac and Pulmonary Rehab  Date 04/13/24  Educator MB  Instruction Review Code 1- Verbalizes Understanding    Know Your Numbers and Heart Failure: - Group verbal and visual instruction to discuss disease risk factors for cardiac and pulmonary disease and treatment options.  Reviews associated critical values for Overweight/Obesity, Hypertension, Cholesterol, and Diabetes.  Discusses basics of heart failure: signs/symptoms and treatments.  Introduces Heart Failure Zone chart for action plan for heart failure. Written material provided at class time.   Core Components/Risk Factors/Patient Goals Review:    Core Components/Risk Factors/Patient Goals at Discharge (Final Review):    ITP  Comments:  ITP Comments     Row Name 04/07/24 1318 04/13/24 1615 05/01/24 1352 05/10/24 1407 06/07/24 1030   ITP Comments Initial phone call completed. Diagnosis can be found in [facility] 7/23. EP Orientation scheduled for Thursday 8/28 at 2pm. Completed and gym orientation for pulmonary rehab. Initial ITP created and sent for review to Dr. Faud Aleskerov, Medical Director. First full day of exercise!  Patient was oriented to gym and equipment including functions, settings, policies, and procedures.  Patient's individual exercise prescription and treatment plan were reviewed.  All starting workloads were established based on the results of the 6 minute walk test done at initial orientation visit.  The plan for exercise progression was also introduced and progression will be customized based on patient's performance and goals. 30 Day review completed. Medical Director ITP review done; changes made as directed and signed approval by Medical Director. New to program. 30 Day review completed. Medical Director ITP review done, changes made as directed, and signed approval by Medical Director.    Row Name 07/05/24 1145 08/02/24 0825 08/30/24 1033       ITP Comments 30 Day review completed. Medical Director ITP review done, changes made as directed, and signed approval by Medical Director. 30 Day review completed. Medical Director ITP review done, changes made as directed, and signed approval by Medical Director. 30 Day review completed. Medical Director ITP review done, changes made as directed, and signed approval by Medical Director.        Comments: 30 Day Review      [1]  Current Outpatient Medications:    ACCU-CHEK GUIDE TEST test strip, USE TO CHECK BLOOD SUGAR 5 TIMES DAILY, Disp: 450 each, Rfl: 0   Accu-Chek Softclix Lancets lancets, Use 1 lancet 3 times daily to check glucose for diabetes, Disp: 300 each, Rfl: 1   albuterol  (PROVENTIL ) (5 MG/ML) 0.5% nebulizer solution, Take 2.5 mg by  nebulization every 6 (six) hours as needed for wheezing or shortness of breath., Disp: , Rfl:    albuterol  (VENTOLIN  HFA) 108 (90 Base) MCG/ACT inhaler, INHALE 2 PUFFS BY MOUTH EVERY 6 HOURS AS NEEDED FOR WHEEZING OR SHORTNESS OF BREATH, Disp: 3 each, Rfl: 5   ALPRAZolam  (XANAX ) 1 MG tablet, Take 1 tablet (1 mg total) by mouth 2 (two) times daily as needed for anxiety or sleep., Disp: 60 tablet, Rfl: 1   amLODipine  (NORVASC ) 2.5 MG tablet, Take 1 tablet (2.5 mg total) by mouth daily., Disp: 30 tablet, Rfl: 0   atorvastatin  (LIPITOR) 10 MG tablet, Take 1 tablet (10 mg total) by mouth daily., Disp: 30 tablet, Rfl: 11   Blood Glucose Monitoring Suppl (ACCU-CHEK GUIDE) w/Device KIT, Use as directed Dx e11.65, Disp: 1 kit, Rfl: 0   budesonide -glycopyrrolate -formoterol  (BREZTRI ) 160-9-4.8 MCG/ACT AERO inhaler, Inhale 2 puffs into the lungs 2 (two) times daily., Disp: 10.7 g, Rfl: 11   calcium  carbonate (OSCAL) 1500 (600 Ca) MG TABS tablet, Take 600 mg of elemental calcium  by mouth 2 (two) times daily with a meal., Disp: , Rfl:    clopidogrel  (PLAVIX ) 75 MG tablet, Take 1 tablet (75 mg total) by mouth daily., Disp: 90 tablet, Rfl: 3   cyclobenzaprine  (FLEXERIL ) 10 MG tablet, Take 1 tablet (10 mg total) by mouth at bedtime. Take one tab po qhs for back spasm prn only, Disp: 30 tablet, Rfl: 3   escitalopram  (LEXAPRO ) 10 MG tablet, Take 1 tablet by mouth once daily, Disp: 90 tablet, Rfl: 0   ferrous sulfate  325 (65 FE) MG tablet, Take 1 tablet (325 mg total) by mouth daily with breakfast., Disp: , Rfl:    furosemide  (LASIX ) 20 MG tablet, Take 1 tablet (20 mg total) by mouth daily as needed., Disp: 30 tablet, Rfl: 5   insulin  glargine, 1 Unit Dial , (TOUJEO ) 300 UNIT/ML Solostar Pen, Inject 10 Units into the skin daily., Disp: , Rfl:    Insulin  Pen Needle (RELION PEN NEEDLES) 31G X 6 MM MISC, USE 1  pen needle with insulin   pen ONCE DAILY for diabetes, Disp: 50 each, Rfl: 11   ipratropium-albuterol  (DUONEB)  0.5-2.5 (3) MG/3ML SOLN, USE 1 AMPULE IN NEBULIZER EVERY 6 HOURS AS NEEDED, Disp: 360 mL, Rfl: 0   losartan  (COZAAR ) 50 MG tablet, Take 1 tablet (50 mg total) by mouth daily., Disp: 90 tablet, Rfl: 1   metFORMIN  (GLUCOPHAGE -XR) 500 MG 24 hr tablet, TAKE 2 TABLETS BY MOUTH ONCE DAILY WITH BREAKFAST, Disp: 180 tablet, Rfl: 0   metoprolol  succinate (TOPROL -XL) 50 MG 24 hr tablet, Take 1 tablet (50 mg total) by mouth daily. Take with or immediately following a meal. May take 1/2 tablet if BP is less than 120/80., Disp: 90 tablet, Rfl: 3   oxyCODONE -acetaminophen  (PERCOCET) 7.5-325 MG tablet, Take 1 tablet by mouth 2 (two) times daily as needed for severe pain (pain score 7-10)., Disp: 60 tablet, Rfl: 0   OXYGEN , Inhale 4 L into the lungs. PT USES [facility] FOR OXYGEN , Disp: , Rfl:    pantoprazole  (PROTONIX ) 40 MG tablet, Take 1 tablet (40 mg total) by mouth daily., Disp: 90 tablet, Rfl: 3   potassium chloride  (KLOR-CON ) 10 MEQ tablet, TAKE 1 TABLET BY MOUTH EVERY OTHER DAY, Disp: 45 tablet, Rfl: 0   pregabalin  (LYRICA ) 75 MG capsule, Take 1 capsule (75 mg total) by mouth 3 (three) times daily., Disp: 90 capsule, Rfl: 0   sulfamethoxazole -trimethoprim  (BACTRIM ) 400-80 MG tablet, Take 1 tablet by mouth 3 (three) times a week., Disp: , Rfl:    vitamin B-12 (CYANOCOBALAMIN ) 100 MCG tablet, Take 100 mcg by mouth daily., Disp: , Rfl:    VITAMIN D , CHOLECALCIFEROL , PO, Take 1 tablet by mouth in the morning., Disp: , Rfl:  [2]  Social History Tobacco Use  Smoking Status Former   Current packs/day: 0.00   Average packs/day: 1 pack/day for 37.0 years (37.0 ttl pk-yrs)   Types: Cigarettes   Start date: 02/06/1973   Quit date: 02/06/2010   Years since quitting: 14.5  Smokeless Tobacco Former   Types: Snuff

## 2024-08-30 NOTE — Progress Notes (Signed)
 Daily Session Note  Patient Details  Name: Angela Jensen MRN: [id number] Date of Birth: June 10, 1956 Referring Provider:   Conrad Ports Pulmonary Rehab from 04/13/2024 in [facility] Cardiac and Pulmonary Rehab  Referring Provider Parris Manna, MD    Encounter Date: 08/30/2024  Check In:  Session Check In - 08/30/24 1404       Check-In   Supervising physician immediately available to respond to emergencies See telemetry face sheet for immediately available ER MD    Location [facility]-Cardiac & Pulmonary Rehab    Staff Present Burnard Davenport RN,BSN,MPA;Laura Cates RN,BSN;Noah Laird HECKLE, Exercise Physiologist;Susan Arana Dyane HECKLE, ACSM CEP, Exercise Physiologist    Virtual Visit No    Medication changes reported     No    Fall or balance concerns reported    No    Warm-up and Cool-down Performed on first and last piece of equipment    Resistance Training Performed Yes    VAD Patient? No    PAD/SET Patient? No      Pain Assessment   Currently in Pain? No/denies             Tobacco Use History[1]  Goals Met:  Proper associated with RPD/PD & O2 Sat Independence with exercise equipment Using PLB without cueing & demonstrates good technique Exercise tolerated well No report of concerns or symptoms today Strength training completed today  Goals Unmet:  Not Applicable  Comments: Pt able to follow exercise prescription today without complaint.  Will continue to monitor for progression.    Dr. Oneil Pinal is Medical Director for [facility] Cardiac Rehabilitation.  Dr. Fuad Aleskerov is Medical Director for [facility] Pulmonary Rehabilitation.    [1]  Social History Tobacco Use  Smoking Status Former   Current packs/day: 0.00   Average packs/day: 1 pack/day for 37.0 years (37.0 ttl pk-yrs)   Types: Cigarettes   Start date: 02/06/1973   Quit date: 02/06/2010   Years since quitting: 14.5  Smokeless Tobacco Former   Types: Snuff

## 2024-08-31 NOTE — Telephone Encounter (Signed)
"  Pt notified med sent  "

## 2024-09-03 ENCOUNTER — Emergency Department

## 2024-09-03 ENCOUNTER — Inpatient Hospital Stay
Admission: EM | Admit: 2024-09-03 | Discharge: 2024-09-07 | DRG: 191 | Disposition: A | Discharge status: Home / Self Care | Attending: Internal Medicine | Admitting: Internal Medicine

## 2024-09-03 ENCOUNTER — Other Ambulatory Visit: Payer: Self-pay

## 2024-09-03 ENCOUNTER — Encounter: Payer: Self-pay | Admitting: Hospitalist

## 2024-09-03 DIAGNOSIS — D472 Monoclonal gammopathy: Secondary | ICD-10-CM | POA: Diagnosis present

## 2024-09-03 DIAGNOSIS — J9612 Chronic respiratory failure with hypercapnia: Secondary | ICD-10-CM | POA: Diagnosis present

## 2024-09-03 DIAGNOSIS — F32A Depression, unspecified: Secondary | ICD-10-CM | POA: Diagnosis present

## 2024-09-03 DIAGNOSIS — Z9981 Dependence on supplemental oxygen: Secondary | ICD-10-CM | POA: Diagnosis not present

## 2024-09-03 DIAGNOSIS — R0602 Shortness of breath: Secondary | ICD-10-CM | POA: Diagnosis present

## 2024-09-03 DIAGNOSIS — Y842 Radiological procedure and radiotherapy as the cause of abnormal reaction of the patient, or of later complication, without mention of misadventure at the time of the procedure: Secondary | ICD-10-CM | POA: Diagnosis present

## 2024-09-03 DIAGNOSIS — Z87891 Personal history of nicotine dependence: Secondary | ICD-10-CM

## 2024-09-03 DIAGNOSIS — I5042 Chronic combined systolic (congestive) and diastolic (congestive) heart failure: Secondary | ICD-10-CM | POA: Diagnosis present

## 2024-09-03 DIAGNOSIS — E1151 Type 2 diabetes mellitus with diabetic peripheral angiopathy without gangrene: Secondary | ICD-10-CM | POA: Diagnosis present

## 2024-09-03 DIAGNOSIS — Z7984 Long term (current) use of oral hypoglycemic drugs: Secondary | ICD-10-CM | POA: Diagnosis not present

## 2024-09-03 DIAGNOSIS — F419 Anxiety disorder, unspecified: Secondary | ICD-10-CM | POA: Diagnosis present

## 2024-09-03 DIAGNOSIS — G4733 Obstructive sleep apnea (adult) (pediatric): Secondary | ICD-10-CM | POA: Diagnosis present

## 2024-09-03 DIAGNOSIS — Z79899 Other long term (current) drug therapy: Secondary | ICD-10-CM | POA: Diagnosis not present

## 2024-09-03 DIAGNOSIS — J441 Chronic obstructive pulmonary disease with (acute) exacerbation: Secondary | ICD-10-CM | POA: Diagnosis present

## 2024-09-03 DIAGNOSIS — Z923 Personal history of irradiation: Secondary | ICD-10-CM

## 2024-09-03 DIAGNOSIS — Z7951 Long term (current) use of inhaled steroids: Secondary | ICD-10-CM

## 2024-09-03 DIAGNOSIS — Z7902 Long term (current) use of antithrombotics/antiplatelets: Secondary | ICD-10-CM

## 2024-09-03 DIAGNOSIS — Z85118 Personal history of other malignant neoplasm of bronchus and lung: Secondary | ICD-10-CM

## 2024-09-03 DIAGNOSIS — J701 Chronic and other pulmonary manifestations due to radiation: Secondary | ICD-10-CM | POA: Diagnosis present

## 2024-09-03 DIAGNOSIS — J9622 Acute and chronic respiratory failure with hypercapnia: Secondary | ICD-10-CM | POA: Diagnosis present

## 2024-09-03 DIAGNOSIS — M1990 Unspecified osteoarthritis, unspecified site: Secondary | ICD-10-CM | POA: Diagnosis present

## 2024-09-03 DIAGNOSIS — Z794 Long term (current) use of insulin: Secondary | ICD-10-CM | POA: Diagnosis not present

## 2024-09-03 DIAGNOSIS — J9611 Chronic respiratory failure with hypoxia: Secondary | ICD-10-CM | POA: Diagnosis present

## 2024-09-03 DIAGNOSIS — Z801 Family history of malignant neoplasm of trachea, bronchus and lung: Secondary | ICD-10-CM

## 2024-09-03 DIAGNOSIS — E782 Mixed hyperlipidemia: Secondary | ICD-10-CM | POA: Diagnosis present

## 2024-09-03 DIAGNOSIS — J9621 Acute and chronic respiratory failure with hypoxia: Secondary | ICD-10-CM | POA: Diagnosis present

## 2024-09-03 DIAGNOSIS — Z8719 Personal history of other diseases of the digestive system: Secondary | ICD-10-CM

## 2024-09-03 DIAGNOSIS — Z83438 Family history of other disorder of lipoprotein metabolism and other lipidemia: Secondary | ICD-10-CM

## 2024-09-03 DIAGNOSIS — I10 Essential (primary) hypertension: Secondary | ICD-10-CM | POA: Diagnosis present

## 2024-09-03 DIAGNOSIS — I110 Hypertensive heart disease with heart failure: Secondary | ICD-10-CM | POA: Diagnosis present

## 2024-09-03 DIAGNOSIS — D649 Anemia, unspecified: Secondary | ICD-10-CM | POA: Diagnosis present

## 2024-09-03 DIAGNOSIS — Z8249 Family history of ischemic heart disease and other diseases of the circulatory system: Secondary | ICD-10-CM

## 2024-09-03 DIAGNOSIS — Z833 Family history of diabetes mellitus: Secondary | ICD-10-CM | POA: Diagnosis not present

## 2024-09-03 LAB — COMPREHENSIVE METABOLIC PANEL WITH GFR
ALT: 15 U/L (ref 0–44)
AST: 21 U/L (ref 15–41)
Albumin: 4.1 g/dL (ref 3.5–5.0)
Alkaline Phosphatase: 54 U/L (ref 38–126)
Anion gap: 9 (ref 5–15)
BUN: 10 mg/dL (ref 8–23)
CO2: 32 mmol/L (ref 22–32)
Calcium: 9.5 mg/dL (ref 8.9–10.3)
Chloride: 101 mmol/L (ref 98–111)
Creatinine, Ser: 0.59 mg/dL (ref 0.44–1.00)
GFR, Estimated: >60 mL/min
Glucose, Bld: 198 mg/dL — ABNORMAL HIGH (ref 70–99)
Potassium: 3.6 mmol/L (ref 3.5–5.1)
Sodium: 142 mmol/L (ref 135–145)
Total Bilirubin: 0.5 mg/dL (ref 0.0–1.2)
Total Protein: 6.7 g/dL (ref 6.5–8.1)

## 2024-09-03 LAB — CBC WITH DIFFERENTIAL/PLATELET
Abs Immature Granulocytes: 0.02 K/uL (ref 0.00–0.07)
Basophils Absolute: 0 K/uL (ref 0.0–0.1)
Basophils Relative: 0 %
Eosinophils Absolute: 0 K/uL (ref 0.0–0.5)
Eosinophils Relative: 0 %
HCT: 36.5 % (ref 36.0–46.0)
Hemoglobin: 10.5 g/dL — ABNORMAL LOW (ref 12.0–15.0)
Immature Granulocytes: 0 %
Lymphocytes Relative: 16 %
Lymphs Abs: 1.2 K/uL (ref 0.7–4.0)
MCH: 26.4 pg (ref 26.0–34.0)
MCHC: 28.8 g/dL — ABNORMAL LOW (ref 30.0–36.0)
MCV: 91.7 fL (ref 80.0–100.0)
Monocytes Absolute: 0.6 K/uL (ref 0.1–1.0)
Monocytes Relative: 8 %
Neutro Abs: 5.8 K/uL (ref 1.7–7.7)
Neutrophils Relative %: 76 %
Platelets: 318 K/uL (ref 150–400)
RBC: 3.98 MIL/uL (ref 3.87–5.11)
RDW: 14.4 % (ref 11.5–15.5)
WBC: 7.7 K/uL (ref 4.0–10.5)
nRBC: 0 % (ref 0.0–0.2)

## 2024-09-03 LAB — CREATININE, SERUM
Creatinine, Ser: 0.61 mg/dL (ref 0.44–1.00)
GFR, Estimated: >60 mL/min

## 2024-09-03 LAB — CBC
HCT: 34.5 % — ABNORMAL LOW (ref 36.0–46.0)
Hemoglobin: 10.3 g/dL — ABNORMAL LOW (ref 12.0–15.0)
MCH: 26.7 pg (ref 26.0–34.0)
MCHC: 29.9 g/dL — ABNORMAL LOW (ref 30.0–36.0)
MCV: 89.4 fL (ref 80.0–100.0)
Platelets: 283 K/uL (ref 150–400)
RBC: 3.86 MIL/uL — ABNORMAL LOW (ref 3.87–5.11)
RDW: 14.6 % (ref 11.5–15.5)
WBC: 9.8 K/uL (ref 4.0–10.5)
nRBC: 0 % (ref 0.0–0.2)

## 2024-09-03 LAB — TROPONIN T, HIGH SENSITIVITY
Troponin T High Sensitivity: 19 ng/L (ref 0–19)
Troponin T High Sensitivity: 16 ng/L (ref 0–19)

## 2024-09-03 LAB — CBG MONITORING, ED: Glucose-Capillary: 333 mg/dL — ABNORMAL HIGH (ref 70–99)

## 2024-09-03 LAB — GLUCOSE, CAPILLARY: Glucose-Capillary: 255 mg/dL — ABNORMAL HIGH (ref 70–99)

## 2024-09-03 LAB — PRO BRAIN NATRIURETIC PEPTIDE: Pro Brain Natriuretic Peptide: 270 pg/mL

## 2024-09-03 MED ORDER — MORPHINE SULFATE (PF) 2 MG/ML IV SOLN
2.0000 mg | INTRAVENOUS | Status: DC | PRN
  Administered 2024-09-03 – 2024-09-04 (×2): 2 mg via INTRAVENOUS
  Filled 2024-09-03 (×2): qty 1

## 2024-09-03 MED ORDER — POLYETHYLENE GLYCOL 3350 17 G PO PACK: 17.0000 g | PACK | Freq: Every day | ORAL | Status: DC | PRN

## 2024-09-03 MED ORDER — IPRATROPIUM-ALBUTEROL 0.5-2.5 (3) MG/3ML IN SOLN
6.0000 mL | Freq: Once | RESPIRATORY_TRACT | Status: AC
  Administered 2024-09-03: 6 mL via RESPIRATORY_TRACT
  Filled 2024-09-03: qty 6

## 2024-09-03 MED ORDER — ALPRAZOLAM 0.5 MG PO TABS
1.0000 mg | ORAL_TABLET | Freq: Two times a day (BID) | ORAL | Status: DC | PRN
  Administered 2024-09-03 – 2024-09-06 (×5): 1 mg via ORAL
  Filled 2024-09-03 (×5): qty 2

## 2024-09-03 MED ORDER — ACETAMINOPHEN 325 MG PO TABS
650.0000 mg | ORAL_TABLET | Freq: Four times a day (QID) | ORAL | Status: DC | PRN
  Administered 2024-09-03 – 2024-09-05 (×3): 650 mg via ORAL
  Filled 2024-09-03 (×3): qty 2

## 2024-09-03 MED ORDER — SODIUM CHLORIDE 0.9 % IV SOLN
100.0000 mg | Freq: Two times a day (BID) | INTRAVENOUS | Status: DC
  Administered 2024-09-03 – 2024-09-05 (×6): 100 mg via INTRAVENOUS
  Filled 2024-09-03 (×8): qty 100

## 2024-09-03 MED ORDER — CLOPIDOGREL BISULFATE 75 MG PO TABS
75.0000 mg | ORAL_TABLET | Freq: Every day | ORAL | Status: DC
  Administered 2024-09-03 – 2024-09-07 (×5): 75 mg via ORAL
  Filled 2024-09-03 (×5): qty 1

## 2024-09-03 MED ORDER — ESCITALOPRAM OXALATE 10 MG PO TABS
10.0000 mg | ORAL_TABLET | Freq: Every day | ORAL | Status: DC
  Administered 2024-09-03 – 2024-09-07 (×5): 10 mg via ORAL
  Filled 2024-09-03 (×5): qty 1

## 2024-09-03 MED ORDER — AMLODIPINE BESYLATE 5 MG PO TABS
2.5000 mg | ORAL_TABLET | Freq: Every day | ORAL | Status: DC
  Administered 2024-09-03 – 2024-09-06 (×4): 2.5 mg via ORAL
  Filled 2024-09-03 (×4): qty 1

## 2024-09-03 MED ORDER — PANTOPRAZOLE SODIUM 40 MG PO TBEC
40.0000 mg | DELAYED_RELEASE_TABLET | Freq: Every day | ORAL | Status: DC
  Administered 2024-09-03 – 2024-09-07 (×5): 40 mg via ORAL
  Filled 2024-09-03 (×5): qty 1

## 2024-09-03 MED ORDER — PREDNISONE 10 MG (21) PO TBPK: ORAL_TABLET | ORAL | 0 refills | Status: DC

## 2024-09-03 MED ORDER — ENOXAPARIN SODIUM 40 MG/0.4ML IJ SOSY
40.0000 mg | PREFILLED_SYRINGE | INTRAMUSCULAR | Status: DC
  Administered 2024-09-03 – 2024-09-06 (×4): 40 mg via SUBCUTANEOUS
  Filled 2024-09-03 (×5): qty 0.4

## 2024-09-03 MED ORDER — SODIUM CHLORIDE 0.9 % IV SOLN
1.0000 g | INTRAVENOUS | Status: AC
  Administered 2024-09-03 – 2024-09-05 (×3): 1 g via INTRAVENOUS
  Filled 2024-09-03 (×3): qty 10

## 2024-09-03 MED ORDER — AZITHROMYCIN 250 MG PO TABS: ORAL_TABLET | ORAL | 0 refills | Status: DC

## 2024-09-03 MED ORDER — ACETAMINOPHEN 650 MG RE SUPP: 650.0000 mg | Freq: Four times a day (QID) | RECTAL | Status: DC | PRN

## 2024-09-03 MED ORDER — LOSARTAN POTASSIUM 50 MG PO TABS
50.0000 mg | ORAL_TABLET | Freq: Every day | ORAL | Status: DC
  Administered 2024-09-03 – 2024-09-06 (×4): 50 mg via ORAL
  Filled 2024-09-03 (×4): qty 1

## 2024-09-03 MED ORDER — ONDANSETRON HCL 4 MG/2ML IJ SOLN: 4.0000 mg | Freq: Four times a day (QID) | INTRAMUSCULAR | Status: DC | PRN

## 2024-09-03 MED ORDER — METOPROLOL SUCCINATE ER 50 MG PO TB24
50.0000 mg | ORAL_TABLET | Freq: Every day | ORAL | Status: DC
  Administered 2024-09-03 – 2024-09-07 (×5): 50 mg via ORAL
  Filled 2024-09-03 (×5): qty 1

## 2024-09-03 MED ORDER — OXYCODONE HCL 5 MG PO TABS
5.0000 mg | ORAL_TABLET | ORAL | Status: DC | PRN
  Administered 2024-09-05 – 2024-09-06 (×2): 5 mg via ORAL
  Filled 2024-09-03 (×3): qty 1

## 2024-09-03 MED ORDER — INSULIN ASPART 100 UNIT/ML IJ SOLN
0.0000 [IU] | Freq: Three times a day (TID) | INTRAMUSCULAR | Status: DC
  Administered 2024-09-03: 7 [IU] via SUBCUTANEOUS
  Administered 2024-09-04 (×2): 2 [IU] via SUBCUTANEOUS
  Administered 2024-09-04 – 2024-09-05 (×2): 3 [IU] via SUBCUTANEOUS
  Administered 2024-09-05: 1 [IU] via SUBCUTANEOUS
  Administered 2024-09-05: 3 [IU] via SUBCUTANEOUS
  Administered 2024-09-06: 5 [IU] via SUBCUTANEOUS
  Administered 2024-09-06: 1 [IU] via SUBCUTANEOUS
  Administered 2024-09-06: 5 [IU] via SUBCUTANEOUS
  Administered 2024-09-07: 2 [IU] via SUBCUTANEOUS
  Filled 2024-09-03: qty 1
  Filled 2024-09-03: qty 3
  Filled 2024-09-03: qty 5
  Filled 2024-09-03: qty 2
  Filled 2024-09-03: qty 3
  Filled 2024-09-03: qty 2
  Filled 2024-09-03: qty 3
  Filled 2024-09-03: qty 5
  Filled 2024-09-03: qty 7
  Filled 2024-09-03: qty 2
  Filled 2024-09-03: qty 9

## 2024-09-03 MED ORDER — CALCIUM CARBONATE 1250 (500 CA) MG PO TABS
500.0000 mg | ORAL_TABLET | Freq: Two times a day (BID) | ORAL | Status: DC
  Administered 2024-09-03 – 2024-09-07 (×6): 1250 mg via ORAL
  Filled 2024-09-03 (×6): qty 1

## 2024-09-03 MED ORDER — ALBUTEROL SULFATE (2.5 MG/3ML) 0.083% IN NEBU
3.0000 mL | INHALATION_SOLUTION | Freq: Four times a day (QID) | RESPIRATORY_TRACT | Status: DC | PRN
  Administered 2024-09-03 – 2024-09-06 (×4): 3 mL via RESPIRATORY_TRACT
  Filled 2024-09-03 (×6): qty 3

## 2024-09-03 MED ORDER — ATORVASTATIN CALCIUM 10 MG PO TABS
10.0000 mg | ORAL_TABLET | Freq: Every day | ORAL | Status: DC
  Administered 2024-09-03 – 2024-09-07 (×5): 10 mg via ORAL
  Filled 2024-09-03 (×5): qty 1

## 2024-09-03 MED ORDER — METHYLPREDNISOLONE SODIUM SUCC 125 MG IJ SOLR
125.0000 mg | Freq: Once | INTRAMUSCULAR | Status: AC
  Administered 2024-09-03: 125 mg via INTRAVENOUS
  Filled 2024-09-03: qty 2

## 2024-09-03 MED ORDER — METHYLPREDNISOLONE SODIUM SUCC 40 MG IJ SOLR
40.0000 mg | Freq: Two times a day (BID) | INTRAMUSCULAR | Status: AC
  Administered 2024-09-03 – 2024-09-05 (×4): 40 mg via INTRAVENOUS
  Filled 2024-09-03 (×4): qty 1

## 2024-09-03 MED ORDER — ONDANSETRON HCL 4 MG PO TABS: 4.0000 mg | ORAL_TABLET | Freq: Four times a day (QID) | ORAL | Status: DC | PRN

## 2024-09-03 MED ORDER — INSULIN ASPART 100 UNIT/ML IJ SOLN
0.0000 [IU] | Freq: Every day | INTRAMUSCULAR | Status: DC
  Administered 2024-09-03: 3 [IU] via SUBCUTANEOUS
  Administered 2024-09-04 – 2024-09-05 (×2): 4 [IU] via SUBCUTANEOUS
  Administered 2024-09-06: 3 [IU] via SUBCUTANEOUS
  Filled 2024-09-03 (×2): qty 4
  Filled 2024-09-03 (×2): qty 3

## 2024-09-03 MED ORDER — INSULIN GLARGINE 100 UNIT/ML ~~LOC~~ SOLN
10.0000 [IU] | Freq: Every day | SUBCUTANEOUS | Status: DC
  Administered 2024-09-03 – 2024-09-06 (×4): 10 [IU] via SUBCUTANEOUS
  Filled 2024-09-03 (×4): qty 0.1

## 2024-09-03 MED ORDER — FUROSEMIDE 20 MG PO TABS
20.0000 mg | ORAL_TABLET | Freq: Two times a day (BID) | ORAL | Status: DC
  Administered 2024-09-03 – 2024-09-07 (×8): 20 mg via ORAL
  Filled 2024-09-03 (×8): qty 1

## 2024-09-03 MED ORDER — IPRATROPIUM-ALBUTEROL 0.5-2.5 (3) MG/3ML IN SOLN
3.0000 mL | Freq: Once | RESPIRATORY_TRACT | Status: AC
  Administered 2024-09-03: 3 mL via RESPIRATORY_TRACT
  Filled 2024-09-03: qty 3

## 2024-09-03 MED ORDER — FERROUS SULFATE 325 (65 FE) MG PO TABS
325.0000 mg | ORAL_TABLET | Freq: Every day | ORAL | Status: DC
  Administered 2024-09-04 – 2024-09-07 (×4): 325 mg via ORAL
  Filled 2024-09-03 (×4): qty 1

## 2024-09-03 NOTE — ED Provider Notes (Signed)
----------------------------------------- °  9:38 AM on 09/03/2024 -----------------------------------------  I took over on this patient from Dr. Jossie.  Although the patient was planned for discharge, she reports significant shortness of breath with even minimal exertion such as walking across the room to use the commode.  She is still wheezing.  She will need further treatment.  I consulted Dr. Roann from the hospitalist service; based on our discussion he agrees to evaluate the patient for admission.   Jacolyn Pae, MD 09/03/24 [phone]

## 2024-09-03 NOTE — ED Notes (Signed)
 RN attempted to discharge pt. Pt states she is feeling SOB again and not ready to leave. EDP aware and new orders placed.

## 2024-09-03 NOTE — ED Notes (Signed)
 Pt alert, NAD, calm, interactive, resps even, mild increased WOB, denies sob at rest, denies nausea or dizziness. Endorses some R posterior thoracic pain with inspiration. Family at [facility]. EDP at [facility].

## 2024-09-03 NOTE — ED Triage Notes (Signed)
 Pt arrives from home via [facility], C/O SHOB on 5L baseline.  Hx COPD/HF.Pt took home albuterol  neb with no relief. Per EMS, bilateral rales. Pt A&Ox4 speaking in compete sentences.

## 2024-09-03 NOTE — H&P (Signed)
 "  History and Physical    SORINA DERRIG FMW:[id number] DOB: 01-09-1956 DOA: 09/03/2024  DOS: the patient was seen and examined on 09/03/2024  PCP: Liana Fish, NP   Patient coming from: Home  I have personally briefly reviewed patient's old medical records in [facility]  Chief Complaint: Shortness of breath  HPI: KENSEY LUEPKE is a pleasant 69 y.o. female with medical history significant for asthma, COPD on home oxygen  5 L at baseline, heart failure,, HTN, HLD, osteoarthritis, OSA who presented to ED complaining of worsening shortness of breath.  Patient was admitted to the hospital between 08/10/2024 and 08/18/2024 for the similar complaints.  EMS reported that patient had respiratory difficulty open their arrival however satting well on her while at her baseline nasal cannula oxygen .  Given 1 treatment of DuoNeb and route.  Patient endorses productive cough of yellowish sputum.  Patient denies any recent travel, sick contact or food or out of ordinary.  Patient is stated that since she was discharged from the hospital she was okay for a week.  Then she started gradually worsening her symptoms.  This morning patient had severe shortness of breath along with cough with yellowish sputum.  She denied any fever, chills, nausea, vomiting, abdominal pain, dysuria, hematuria.  ED Course: Upon arrival to the ED, patient is found to be tachycardic at 104 and tachypneic at 26.  EKG shows sinus tachycardia at 100 bpm, chest x-ray showed stable chronic right lower airspace disease, differential includes recurrent malignancy or superimposed infection follow-up chest CT if there are new or worsening symptoms.  Patient was given steroid, antibiotics azithromycin  and nebulization.  Hospitalist service was consulted for evaluation for admission.  Review of Systems:  ROS  All other systems negative except as noted in the HPI.  Past Medical History:  Diagnosis Date   Anemia    Arthritis    Asthma     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   Cancer [facility]) 2012   Right Lung CA   COPD (chronic obstructive pulmonary disease) (HCC)    Depression    Diabetes mellitus without complication (HCC)    Patient takes Janumet   Essential hypertension 05/26/2016   Heart failure (HCC) 2022   Hydropneumothorax 05/03/2020   Hypercholesteremia    Oxygen  dependent    2L at nite    PAD (peripheral artery disease) 06/22/2016   Peripheral vascular disease    Personal history of radiation therapy    Shortness of breath dyspnea    with exertion    Sialolithiasis    Sleep apnea    Wears dentures    full upper and lower    Past Surgical History:  Procedure Laterality Date   APPLICATION OF CELL SAVER Left 01/21/2024   Procedure: APPLICATION OF CELL SAVER;  Surgeon: Marea Selinda RAMAN, MD;  Location: [facility] ORS;  Service: Vascular;  Laterality: Left;   CESAREAN SECTION     x3   COLONOSCOPY N/A 04/03/2024   Procedure: COLONOSCOPY;  Surgeon: Jinny Carmine, MD;  Location: [facility] ENDOS[facility];  Service: Endoscopy;  Laterality: N/A;   COLONOSCOPY WITH PROPOFOL  N/A 06/25/2015   Procedure: COLONOSCOPY WITH PROPOFOL ;  Surgeon: Carmine Jinny, MD;  Location: [facility] END[facility];  Service: Endoscopy;  Laterality: N/A;   COLONOSCOPY WITH PROPOFOL  N/A 07/26/2020   Procedure: COLONOSCOPY WITH PROPOFOL ;  Surgeon: Jinny Carmine, MD;  Location: [facility];  Service: Endoscopy;  Laterality: N/A;   CYST REMOVAL LEG     and  on shoulder    ENDARTERECTOMY FEMORAL Left 01/21/2024   Procedure: LEFT COMMON, SUPERFICIAL FEMORAL AND PROFUNDA FEMORIS ENDARTERECTOMY WITH BOVINE PERICARDIAL PATCH ANGIOPLASTY;  Surgeon: Marea Selinda RAMAN, MD;  Location: [facility];  Service: Vascular;  Laterality: Left;   ESOPHAGOGASTRODUODENOSCOPY (EGD) WITH PROPOFOL  N/A 07/26/2020   Procedure: ESOPHAGOGASTRODUODENOSCOPY (EGD) WITH PROPOFOL ;  Surgeon: Jinny Carmine, MD;  Location: [facility];  Service: Endoscopy;   Laterality: N/A;  Diabetic - oral meds   INSERTION OF ILIAC STENT Left 01/21/2024   Procedure: INSERTION, STENT, ARTERY, ILIAC;  Surgeon: Marea Selinda RAMAN, MD;  Location: [facility];  Service: Vascular;  Laterality: Left;  AND SFA STENTS   LOWER EXTREMITY ANGIOGRAPHY Left 09/29/2018   Procedure: LOWER EXTREMITY ANGIOGRAPHY;  Surgeon: Marea Selinda RAMAN, MD;  Location: [facility];  Service: Cardiovascular;  Laterality: Left;   LOWER EXTREMITY ANGIOGRAPHY Left 07/29/2023   Procedure: Lower Extremity Angiography;  Surgeon: Marea Selinda RAMAN, MD;  Location: [facility];  Service: Cardiovascular;  Laterality: Left;   LOWER EXTREMITY ANGIOGRAPHY Right 12/20/2023   Procedure: Lower Extremity Angiography;  Surgeon: Marea Selinda RAMAN, MD;  Location: [facility];  Service: Cardiovascular;  Laterality: Right;   LOWER EXTREMITY ANGIOGRAPHY Left 01/17/2024   Procedure: Lower Extremity Angiography;  Surgeon: Marea Selinda RAMAN, MD;  Location: [facility];  Service: Cardiovascular;  Laterality: Left;   LUNG BIOPSY  12/30/2011   has lung spots   PACEMAKER IMPLANT  07/14/2021   PACEMAKER LEADLESS INSERTION N/A 07/14/2021   Procedure: PACEMAKER LEADLESS INSERTION;  Surgeon: Ammon Blunt, MD;  Location: [facility];  Service: Cardiovascular;  Laterality: N/A;   PERIPHERAL VASCULAR CATHETERIZATION Left 06/01/2016   Procedure: Lower Extremity Angiography;  Surgeon: Selinda RAMAN Marea, MD;  Location: [facility];  Service: Cardiovascular;  Laterality: Left;   PERIPHERAL VASCULAR CATHETERIZATION N/A 06/01/2016   Procedure: Abdominal Aortogram w/Lower Extremity;  Surgeon: Selinda RAMAN Marea, MD;  Location: [facility];  Service: Cardiovascular;  Laterality: N/A;   PERIPHERAL VASCULAR CATHETERIZATION  06/01/2016   Procedure: Lower Extremity Intervention;  Surgeon: Selinda RAMAN Marea, MD;  Location: [facility];  Service: Cardiovascular;;   PERIPHERAL VASCULAR CATHETERIZATION Right  06/08/2016   Procedure: Lower Extremity Angiography;  Surgeon: Selinda RAMAN Marea, MD;  Location: [facility];  Service: Cardiovascular;  Laterality: Right;   PERIPHERAL VASCULAR CATHETERIZATION  06/08/2016   Procedure: Lower Extremity Intervention;  Surgeon: Selinda RAMAN Marea, MD;  Location: [facility];  Service: Cardiovascular;;   POLYPECTOMY  04/03/2024   Procedure: POLYPECTOMY, INTESTINE;  Surgeon: Jinny Carmine, MD;  Location: [facility];  Service: Endoscopy;;   SUBMANDIBULAR GLAND EXCISION Left 12/06/2020   Procedure: EXCISION SUBMANDIBULAR GLAND;  Surgeon: Herminio Miu, MD;  Location: [facility];  Service: ENT;  Laterality: Left;  needs to be first case Diabetic - diet controlled   TEMPORARY PACEMAKER N/A 07/11/2021   Procedure: TEMPORARY PACEMAKER;  Surgeon: Ammon Blunt, MD;  Location: [facility];  Service: Cardiovascular;  Laterality: N/A;     reports that she quit smoking about 14 years ago. Her smoking use included cigarettes. She started smoking about 51 years ago. She has a 37 pack-year smoking history. She has quit using smokeless tobacco.  Her smokeless tobacco use included snuff. She reports that she does not currently use alcohol after a past usage of about 5.0 standard drinks of alcohol per week. She reports that she does not currently use drugs  after having used the following drugs: Marijuana, Crack cocaine, and Cocaine.  Allergies[1]  Family History  Problem Relation Age of Onset   Diabetes Mother    Hypercholesterolemia Mother    Lung cancer Father    Diabetes Sister    Diabetes Sister    Hypertension Sister    Diabetes Maternal Grandmother    Diabetes Paternal Grandmother    Heart attack Brother    Coronary artery disease Brother    Vascular Disease Brother    Heart attack Brother    Breast cancer Neg Hx     Prior to Admission medications  Medication Sig Start Date End Date Taking? Authorizing Provider  azithromycin   (ZITHROMAX  Z-PAK) 250 MG tablet Take 2 tablets (500 mg) on  Day 1,  followed by 1 tablet (250 mg) once daily on Days 2 through 5. 09/03/24 09/08/24 Yes Bradler, Evan K, MD  predniSONE  (STERAPRED UNI-PAK 21 TAB) 10 MG (21) TBPK tablet As directed on packaging 09/03/24  Yes Bradler, Evan K, MD  ACCU-CHEK GUIDE TEST test strip USE TO CHECK BLOOD SUGAR 5 TIMES DAILY 06/12/24   Liana Fish, NP  Accu-Chek Softclix Lancets lancets Use 1 lancet 3 times daily to check glucose for diabetes 01/19/23   Liana Fish, NP  albuterol  (PROVENTIL ) (5 MG/ML) 0.5% nebulizer solution Take 2.5 mg by nebulization every 6 (six) hours as needed for wheezing or shortness of breath.    [provider]  albuterol  (VENTOLIN  HFA) 108 (90 Base) MCG/ACT inhaler INHALE 2 PUFFS BY MOUTH EVERY 6 HOURS AS NEEDED FOR WHEEZING OR SHORTNESS OF BREATH 10/13/23   Liana Fish, NP  ALPRAZolam  (XANAX ) 1 MG tablet Take 1 tablet (1 mg total) by mouth 2 (two) times daily as needed for anxiety or sleep. 06/15/24   Abernathy, Alyssa, NP  amLODipine  (NORVASC ) 2.5 MG tablet Take 1 tablet (2.5 mg total) by mouth daily. 08/18/24   Jens Durand, MD  atorvastatin  (LIPITOR) 10 MG tablet Take 1 tablet (10 mg total) by mouth daily. 07/29/23 08/10/24  Marea Selinda RAMAN, MD  Blood Glucose Monitoring Suppl (ACCU-CHEK GUIDE) w/Device KIT Use as directed Dx e11.65 04/11/21   Khan, Fozia M, MD  budesonide -glycopyrrolate -formoterol  (BREZTRI ) 160-9-4.8 MCG/ACT AERO inhaler Inhale 2 puffs into the lungs 2 (two) times daily. 04/26/24   Liana Fish, NP  calcium  carbonate (OSCAL) 1500 (600 Ca) MG TABS tablet Take 600 mg of elemental calcium  by mouth 2 (two) times daily with a meal.    [provider]  clopidogrel  (PLAVIX ) 75 MG tablet Take 1 tablet (75 mg total) by mouth daily. 10/13/23   Liana Fish, NP  cyclobenzaprine  (FLEXERIL ) 10 MG tablet Take 1 tablet (10 mg total) by mouth at bedtime. Take one tab po qhs for back spasm prn only  10/13/23   Liana Fish, NP  escitalopram  (LEXAPRO ) 10 MG tablet Take 1 tablet by mouth once daily 08/28/24   Liana Fish, NP  ferrous sulfate  325 (65 FE) MG tablet Take 1 tablet (325 mg total) by mouth daily with breakfast. 04/12/24   Maree Hue, MD  furosemide  (LASIX ) 20 MG tablet Take 1 tablet (20 mg total) by mouth daily as needed. 04/27/23   Abernathy, Alyssa, NP  insulin  glargine, 1 Unit Dial , (TOUJEO ) 300 UNIT/ML Solostar Pen Inject 10 Units into the skin daily. 08/18/24   Jens Durand, MD  Insulin  Pen Needle (RELION PEN NEEDLES) 31G X 6 MM MISC USE 1  pen needle with insulin  pen ONCE DAILY for diabetes 04/26/24   Liana Fish,  NP  ipratropium-albuterol  (DUONEB) 0.5-2.5 (3) MG/3ML SOLN USE 1 AMPULE IN NEBULIZER EVERY 6 HOURS AS NEEDED 07/20/24   Liana Fish, NP  losartan  (COZAAR ) 50 MG tablet Take 1 tablet (50 mg total) by mouth daily. 05/15/24   Liana Fish, NP  metFORMIN  (GLUCOPHAGE -XR) 500 MG 24 hr tablet TAKE 2 TABLETS BY MOUTH ONCE DAILY WITH BREAKFAST 01/17/24   Abernathy, Alyssa, NP  metoprolol  succinate (TOPROL -XL) 50 MG 24 hr tablet Take 1 tablet (50 mg total) by mouth daily. Take with or immediately following a meal. May take 1/2 tablet if BP is less than 120/80. 04/26/24   Abernathy, Fish, NP  oxyCODONE -acetaminophen  (PERCOCET) 7.5-325 MG tablet Take 1 tablet by mouth 2 (two) times daily as needed for severe pain (pain score 7-10). 08/30/24   Liana Fish, NP  OXYGEN  Inhale 4 L into the lungs. PT USES [facility] FOR OXYGEN     [provider]  pantoprazole  (PROTONIX ) 40 MG tablet Take 1 tablet (40 mg total) by mouth daily. 05/23/24   Liana Fish, NP  potassium chloride  (KLOR-CON ) 10 MEQ tablet TAKE 1 TABLET BY MOUTH EVERY OTHER DAY 07/17/24   Liana Fish, NP  pregabalin  (LYRICA ) 75 MG capsule Take 1 capsule (75 mg total) by mouth 3 (three) times daily. 08/07/24   Brown, Fallon E, NP  sulfamethoxazole -trimethoprim  (BACTRIM ) 400-80 MG tablet  Take 1 tablet by mouth 3 (three) times a week. 08/05/24   [provider]  vitamin B-12 (CYANOCOBALAMIN ) 100 MCG tablet Take 100 mcg by mouth daily.    [provider]  VITAMIN D , CHOLECALCIFEROL , PO Take 1 tablet by mouth in the morning.    [provider]    Physical Exam: Vitals:   09/03/24 1115 09/03/24 1130 09/03/24 1145 09/03/24 1200  BP:  (!) 160/97  (!) 175/103  Pulse: (!) 103 (!) 102 (!) 109 (!) 106  Resp: 17 15 16 13   Temp:      TempSrc:      SpO2: 100% 100% 99% 100%  Weight:      Height:        Physical Exam   Constitutional: Alert, awake, calm, comfortable HEENT: Neck supple Respiratory: Bilateral decreased air entry at the bases.  She has scattered wheezing, no rales occasional rhonchi present Cardiovascular: Regular rate and rhythm, no murmurs / rubs / gallops. No extremity edema. 2+ pedal pulses. No carotid bruits.  Abdomen: Soft, no tenderness, Bowel sounds positive.  Musculoskeletal: no clubbing / cyanosis. Good ROM, no contractures. Normal muscle tone.  Skin: no rashes, lesions, ulcers. Neurologic: CN 2-12 grossly intact. Sensation intact, No focal deficit identified Psychiatric: Alert and oriented x 3. Normal mood.    Labs on Admission: I have personally reviewed following labs and imaging studies  CBC: Recent Labs  Lab 09/03/24 0545 09/03/24 1100  WBC 7.7 9.8  NEUTROABS 5.8  --   HGB 10.5* 10.3*  HCT 36.5 34.5*  MCV 91.7 89.4  PLT 318 283   Basic Metabolic Panel: Recent Labs  Lab 09/03/24 0545 09/03/24 1100  NA 142  --   K 3.6  --   CL 101  --   CO2 32  --   GLUCOSE 198*  --   BUN 10  --   CREATININE 0.59 0.61  CALCIUM  9.5  --    GFR: Estimated Creatinine Clearance: 50.2 mL/min (by C-G formula based on SCr of 0.61 mg/dL). Liver Function Tests: Recent Labs  Lab 09/03/24 0545  AST 21  ALT 15  ALKPHOS 54  BILITOT 0.5  PROT 6.7  ALBUMIN 4.1   No results for input(s): LIPASE, AMYLASE in the last 168  hours. No results for input(s): AMMONIA in the last 168 hours. Coagulation Profile: No results for input(s): INR, PROTIME in the last 168 hours. Cardiac Enzymes: No results for input(s): CKTOTAL, CKMB, CKMBINDEX, TROPONINI, TROPONINIHS in the last 168 hours. BNP (last 3 results) Recent Labs    03/31/24 1848 04/09/24 0834 04/18/24 0622  BNP 57.5 259.4* 146.8*   HbA1C: No results for input(s): HGBA1C in the last 72 hours. CBG: No results for input(s): GLUCAP in the last 168 hours. Lipid Profile: No results for input(s): CHOL, HDL, LDLCALC, TRIG, CHOLHDL, LDLDIRECT in the last 72 hours. Thyroid  Function Tests: No results for input(s): TSH, T4TOTAL, FREET4, T3FREE, THYROIDAB in the last 72 hours. Anemia Panel: No results for input(s): VITAMINB12, FOLATE, FERRITIN, TIBC, IRON , RETICCTPCT in the last 72 hours. Urine analysis:    Component Value Date/Time   COLORURINE STRAW (A) 04/17/2023 0930   APPEARANCEUR CLEAR (A) 04/17/2023 0930   APPEARANCEUR Clear 08/06/2022 1541   LABSPEC 1.014 04/17/2023 0930   PHURINE 6.0 04/17/2023 0930   GLUCOSEU NEGATIVE 04/17/2023 0930   HGBUR MODERATE (A) 04/17/2023 0930   BILIRUBINUR NEGATIVE 04/17/2023 0930   BILIRUBINUR Negative 08/06/2022 1541   KETONESUR NEGATIVE 04/17/2023 0930   PROTEINUR NEGATIVE 04/17/2023 0930   UROBILINOGEN 0.2 04/22/2021 0959   NITRITE NEGATIVE 04/17/2023 0930   LEUKOCYTESUR NEGATIVE 04/17/2023 0930    Radiological Exams on Admission: I have personally reviewed images DG Chest Port 1 View Result Date: 09/03/2024 EXAM: 1 VIEW(S) XRAY OF THE CHEST 09/03/2024 06:02:00 AM COMPARISON: 08/10/2024 CLINICAL HISTORY: SHOB SHOB SHOB SHOB SHOB FINDINGS: LINES, TUBES AND DEVICES: Cardiac loop recorder noted. LUNGS AND PLEURA: Right upper lobe scarring, most likely representing post-radiation fibrosis or post-inflammatory changes. Persistent, chronic airspace opacity involving  the right lower lobe. This corresponds to an area of prior external beam radiation in a patient who has a history of lung cancer. The appearance is stable from 08/10/2024. Trace right pleural effusion, which could be related to prior radiation, heart failure, or other causes. HEART AND MEDIASTINUM: No acute abnormality of the cardiac and mediastinal silhouettes. BONES AND SOFT TISSUES: No acute osseous abnormality. IMPRESSION: 1. Stable chronic right lower lobe airspace opacity in the prior radiation field, favored post-radiation change; differential includes recurrent malignancy or superimposed infection, and follow-up chest CT is recommended if there are new or worsening symptoms or per oncology surveillance. 2. Trace right pleural effusion. Electronically signed by: Waddell Calk MD 09/03/2024 06:18 AM EST RP Workstation: [id number]    EKG: My personal interpretation of EKG shows: Sinus tachycardia, no ST elevation    Assessment/Plan Principal Problem:   COPD exacerbation (HCC) Active Problems:   Acute on chronic respiratory failure with hypoxia and hypercapnia (HCC)   Chronic combined systolic and diastolic CHF (congestive heart failure) (HCC)   HTN (hypertension)   Mixed hyperlipidemia   Anxiety and depression   MGUS (monoclonal gammopathy of unknown significance)    Assessment and Plan: 69 year old female double/PMH of asthma/COPD on home oxygen  5 L via nasal cannula, CHF, HTN, HLD, anxiety/depression who came into ED complaining of worsening shortness of breath.  1.  Acute exacerbation of COPD/chronic hypoxemic respiratory failure on home oxygen  palliative by nasal cannula - She was recently admitted and discharged for the similar complaints. - She will be admitted to hospital as inpatient due to risk of worsening in the setting of severe COPD -  She has lung nodules but pulmonary is following and advised this is not a cancer. - She will be continued on nebulization, IV steroid,  antibiotics. - Continue oxygen  to maintain saturation more than 90% - Continue to follow hospital course.  2.  CHF - Patient takes Lasix  20 mg as needed. - While she is in the hospital I will continue Lasix  20 mg p.o. twice a day. - Continue to monitor symptoms.  3.  Diabetes - Continue her insulin  Lantus  and sliding scale. - Continue to monitor blood sugars  4.  Anxiety/depression - Continue alprazolam  and citalopram at home dose.  5.  HTN/HLD - Continue amlodipine  2.5 mg daily and atorvastatin  10 mg daily. - Continue monitor blood pressure  6.  Anemia - Continue ferrous sulfate  at home dose.       DVT prophylaxis: Lovenox  Code Status: Full Code Family Communication: Family at bedside  Disposition Plan: Home  Consults called: None Admission status: Inpatient, Telemetry bed   Nena Rebel, MD [facility] 09/03/2024, 1:51 PM        [1]  Allergies Allergen Reactions   Iodinated Contrast Media Itching   Trelegy Ellipta  [Fluticasone -Umeclidin-Vilant]     Had breathing issues   Bactoshield Chg [Chlorhexidine  Gluconate] Itching and Other (See Comments)    Dry. Flaking, peeling skin   "

## 2024-09-03 NOTE — ED Provider Notes (Signed)
 "  [facility] Provider Note   Event Date/Time   First MD Initiated Contact with Patient 09/03/24 [phone]     (approximate) History  Shortness of Breath  HPI Angela Jensen is a 69 y.o. female with past medical history of COPD on 5 L chronically presents complaining of worsening shortness of breath.  EMS noted patient to have some respiratory difficulty upon their arrival however satting well on her baseline 5 L.  Patient was given 1 DuoNeb en route.  Patient endorses productive cough of yellow sputum.  Patient denies any recent travel, sick contacts, or food out of the ordinary ROS: Patient currently denies any vision changes, tinnitus, difficulty speaking, facial droop, sore throat, chest pain,  abdominal pain, nausea/vomiting/diarrhea, dysuria, or weakness/numbness/paresthesias in any extremity   Physical Exam  Triage Vital Signs: ED Triage Vitals  Encounter Vitals Group     BP      Girls Systolic BP Percentile      Girls Diastolic BP Percentile      Boys Systolic BP Percentile      Boys Diastolic BP Percentile      Pulse      Resp      Temp      Temp src      SpO2      Weight      Height      Head Circumference      Peak Flow      Pain Score      Pain Loc      Pain Education      Exclude from Growth Chart    Most recent vital signs: Vitals:   09/03/24 0548 09/03/24 0631  BP: (!) 142/88 (!) 156/89  Pulse: (!) 104 100  Resp: (!) 26 (!) 32  Temp: 98.9 F (37.2 C)   SpO2: 95% 100%   General: Awake, oriented x4. CV:  Good peripheral perfusion. Resp:  Increased effort.  Inspiratory and expiratory wheezing over bilateral lung fields Abd:  No distention. Other:  Elderly well-developed, well-nourished African-American female in mild respiratory distress ED Results / Procedures / Treatments  Labs (all labs ordered are listed, but only abnormal results are displayed) Labs Reviewed  COMPREHENSIVE METABOLIC PANEL WITH GFR - Abnormal; Notable for the  following components:      Result Value   Glucose, Bld 198 (*)    All other components within normal limits  CBC WITH DIFFERENTIAL/PLATELET - Abnormal; Notable for the following components:   Hemoglobin 10.5 (*)    MCHC 28.8 (*)    All other components within normal limits  PRO BRAIN NATRIURETIC PEPTIDE  TROPONIN T, HIGH SENSITIVITY  TROPONIN T, HIGH SENSITIVITY   EKG ED ECG REPORT I, Artist MARLA Kerns, the attending physician, personally viewed and interpreted this ECG. Date: 09/03/2024 EKG Time: 0608 Rate: 100 Rhythm: Tachycardic sinus rhythm QRS Axis: normal Intervals: RBBB ST/T Wave abnormalities: normal Narrative Interpretation: no evidence of acute ischemia RADIOLOGY ED MD interpretation: Single view portable x-ray of the chest shows stable chronic right lower lobe airspace opacity - All radiology independently interpreted and agree with radiology assessment Official radiology report(s): DG Chest Port 1 View Result Date: 09/03/2024 EXAM: 1 VIEW(S) XRAY OF THE CHEST 09/03/2024 06:02:00 AM COMPARISON: 08/10/2024 CLINICAL HISTORY: SHOB SHOB SHOB SHOB SHOB FINDINGS: LINES, TUBES AND DEVICES: Cardiac loop recorder noted. LUNGS AND PLEURA: Right upper lobe scarring, most likely representing post-radiation fibrosis or post-inflammatory changes. Persistent, chronic airspace opacity involving the right lower lobe.  This corresponds to an area of prior external beam radiation in a patient who has a history of lung cancer. The appearance is stable from 08/10/2024. Trace right pleural effusion, which could be related to prior radiation, heart failure, or other causes. HEART AND MEDIASTINUM: No acute abnormality of the cardiac and mediastinal silhouettes. BONES AND SOFT TISSUES: No acute osseous abnormality. IMPRESSION: 1. Stable chronic right lower lobe airspace opacity in the prior radiation field, favored post-radiation change; differential includes recurrent malignancy or superimposed infection,  and follow-up chest CT is recommended if there are new or worsening symptoms or per oncology surveillance. 2. Trace right pleural effusion. Electronically signed by: Waddell Calk MD 09/03/2024 06:18 AM EST RP Workstation: [id number]   PROCEDURES: Critical Care performed: No Procedures MEDICATIONS ORDERED IN ED: Medications  ipratropium-albuterol  (DUONEB) 0.5-2.5 (3) MG/3ML nebulizer solution 6 mL (6 mLs Nebulization Given 09/03/24 0603)  methylPREDNISolone  sodium succinate (SOLU-MEDROL ) 125 mg/2 mL injection 125 mg (125 mg Intravenous Given 09/03/24 0557)   IMPRESSION / MDM / ASSESSMENT AND PLAN / ED COURSE  I reviewed the triage vital signs and the nursing notes.                             The patient is on the cardiac monitor to evaluate for evidence of arrhythmia and/or significant heart rate changes. Patient's presentation is most consistent with acute presentation with potential threat to life or bodily function. Patient is a 69 year old female who appears to be suffering from a moderate exacerbation of COPD.  Based on the history, exam, CXR/EKG, and further workup I don't suspect any other emergent cause of this presentation, such as pneumonia, acute coronary syndrome, congestive heart failure, pulmonary embolism, or pneumothorax.  ED Interventions: bronchodilators, steroids, reassess  Upon reassessment: After treatment, the patient's shortness of breath is resolved, and their lung exam has returned to baseline. They are comfortable and want to go home.  Rx: Steroids, Antibiotics Disposition: Discharge home with SRP. PCP follow up recommended in next 48hours.   FINAL CLINICAL IMPRESSION(S) / ED DIAGNOSES   Final diagnoses:  COPD exacerbation (HCC)  Shortness of breath   Rx / DC Orders   ED Discharge Orders          Ordered    predniSONE  (STERAPRED UNI-PAK 21 TAB) 10 MG (21) TBPK tablet        09/03/24 0642    azithromycin  (ZITHROMAX  Z-PAK) 250 MG tablet        09/03/24  9357           Note:  This document was prepared using Dragon voice recognition software and may include unintentional dictation errors.   Kalliopi Coupland K, MD 09/03/24 0730  "

## 2024-09-04 ENCOUNTER — Encounter

## 2024-09-04 DIAGNOSIS — J441 Chronic obstructive pulmonary disease with (acute) exacerbation: Secondary | ICD-10-CM | POA: Diagnosis not present

## 2024-09-04 LAB — BASIC METABOLIC PANEL WITH GFR
Anion gap: 12 (ref 5–15)
BUN: 14 mg/dL (ref 8–23)
CO2: 34 mmol/L — ABNORMAL HIGH (ref 22–32)
Calcium: 9.9 mg/dL (ref 8.9–10.3)
Chloride: 98 mmol/L (ref 98–111)
Creatinine, Ser: 0.58 mg/dL (ref 0.44–1.00)
GFR, Estimated: >60 mL/min
Glucose, Bld: 112 mg/dL — ABNORMAL HIGH (ref 70–99)
Potassium: 3.7 mmol/L (ref 3.5–5.1)
Sodium: 144 mmol/L (ref 135–145)

## 2024-09-04 LAB — CBC
HCT: 36.8 % (ref 36.0–46.0)
Hemoglobin: 10.9 g/dL — ABNORMAL LOW (ref 12.0–15.0)
MCH: 26.4 pg (ref 26.0–34.0)
MCHC: 29.6 g/dL — ABNORMAL LOW (ref 30.0–36.0)
MCV: 89.1 fL (ref 80.0–100.0)
Platelets: 318 K/uL (ref 150–400)
RBC: 4.13 MIL/uL (ref 3.87–5.11)
RDW: 14.6 % (ref 11.5–15.5)
WBC: 8.7 K/uL (ref 4.0–10.5)
nRBC: 0 % (ref 0.0–0.2)

## 2024-09-04 LAB — GLUCOSE, CAPILLARY
Glucose-Capillary: 155 mg/dL — ABNORMAL HIGH (ref 70–99)
Glucose-Capillary: 157 mg/dL — ABNORMAL HIGH (ref 70–99)
Glucose-Capillary: 234 mg/dL — ABNORMAL HIGH (ref 70–99)
Glucose-Capillary: 348 mg/dL — ABNORMAL HIGH (ref 70–99)

## 2024-09-04 NOTE — TOC CM/SW Note (Signed)
 Transition of Care [facility]) - Inpatient Brief Assessment   Patient Details  Name: Angela Jensen MRN: [id number] Date of Birth: 03-Apr-1956  Transition of Care [facility] CM/SW Contact:    Nathanael CHRISTELLA Ring, RN Phone Number: 09/04/2024, 9:51 AM   Clinical Narrative: Patient is already receiving OP Cardiopulmonary Rehab here at [facility], she would like to continue this at discharge.   High risk reassessment completed, no other TOC needs identified this time.    Transition of Care Asessment: Insurance and Status: Insurance coverage has been reviewed Patient has primary care physician: Yes Home environment has been reviewed: From home Prior level of function:: independent OP Cardiopulmonary rehab Prior/Current Home Services: No current home services Social Drivers of Health Review: SDOH reviewed no interventions necessary Readmission risk has been reviewed: Yes Transition of care needs: no transition of care needs at this time

## 2024-09-04 NOTE — Evaluation (Signed)
 Physical Therapy Evaluation Patient Details Name: Angela Jensen MRN: [id number] DOB: 21-Mar-1956 Today's Date: 09/04/2024  History of Present Illness  Pt is a pleasant 69 y.o. female with medical history significant for asthma, COPD on home oxygen  5 L at baseline, heart failure, DM, HTN, HLD, osteoarthritis, OSA who presented to ED complaining of worsening shortness of breath.  MD assessment includes acute COPD exacerbation/chronic hypoxemic respiratory failure.   Clinical Impression  Pt was pleasant and motivated to participate during the session and put forth good effort throughout. Pt required no physical assistance with functional tasks per below and was Ind/Mod Ind throughout the session.  Pt self-selected use of a RW during amb with distance limited to 30 feet initially to assess SpO2 response to activity.  Pt was found on 4L with SpO2 92% at rest. After amb her SpO2 dropped to 74% with good pleth and required around 2 min of cuing for PLB to come back up to 92%, nsg and MD notified.  Will keep pt on PT caseload while in acute care but no skilled PT needs at discharge where pt desires to return to working with cardiopulmonary rehab.             If plan is discharge home, recommend the following: Assist for transportation   Can travel by private vehicle        Equipment Recommendations None recommended by PT  Recommendations for Other Services       Functional Status Assessment Patient has had a recent decline in their functional status and demonstrates the ability to make significant improvements in function in a reasonable and predictable amount of time.     Precautions / Restrictions Precautions Precautions: Fall Restrictions Weight Bearing Restrictions Per Provider Order: No      Mobility  Bed Mobility Overal bed mobility: Independent                  Transfers Overall transfer level: Modified independent Equipment used: Rolling walker (2 wheels)                General transfer comment: Good eccentric and concentric control and stability from multiple surfaces    Ambulation/Gait Ambulation/Gait assistance: Modified independent (Device/Increase time) Gait Distance (Feet): 30 Feet Assistive device: Rolling walker (2 wheels) Gait Pattern/deviations: Step-through pattern, Decreased step length - right, Decreased step length - left Gait velocity: decreased     General Gait Details: Min reduced cadence but steady without overt LOB including sharp turns and navigating tight spaces with RW  Stairs            Wheelchair Mobility     Tilt Bed    Modified Rankin (Stroke Patients Only)       Balance Overall balance assessment: No apparent balance deficits (not formally assessed)                                           Pertinent Vitals/Pain Pain Assessment Pain Assessment: No/denies pain    Home Living Family/patient expects to be discharged to:: Private residence Living Arrangements: Spouse/significant other Available Help at Discharge: Family;Available 24 hours/day Type of Home: House Home Access: Stairs to enter Entrance Stairs-Rails: Right;Left;Can reach both Entrance Stairs-Number of Steps: 1   Home Layout: Laundry or work area in basement;Two level;Able to live on main level with bedroom/bathroom Home Equipment: Rollator (4 wheels);Cane - single point;Shower seat;BSC/3in1;Rolling Environmental Consultant (  2 wheels) Additional Comments: uses 5LO2/min at home at all times    Prior Function Prior Level of Function : Independent/Modified Independent;Driving             Mobility Comments: Ind amb in the home mostly without an AD, uses a rollator PRN mostly to sit on while working in the kitchen, no fall history ADLs Comments: Independent with bathing & dressing.     Extremity/Trunk Assessment   Upper Extremity Assessment Upper Extremity Assessment: Overall WFL for tasks assessed    Lower Extremity  Assessment Lower Extremity Assessment: Overall WFL for tasks assessed       Communication   Communication Communication: No apparent difficulties    Cognition Arousal: Alert Behavior During Therapy: WFL for tasks assessed/performed   PT - Cognitive impairments: No apparent impairments                         Following commands: Intact       Cueing Cueing Techniques: Verbal cues     General Comments      Exercises Other Exercises Other Exercises: PLB education   Assessment/Plan    PT Assessment Patient needs continued PT services  PT Problem List Decreased activity tolerance       PT Treatment Interventions Therapeutic activities;Therapeutic exercise;Patient/family education    PT Goals (Current goals can be found in the Care Plan section)  Acute Rehab PT Goals Patient Stated Goal: To continue working with cardiopulmonary rehab at discharge PT Goal Formulation: With patient Time For Goal Achievement: 09/17/24 Potential to Achieve Goals: Good    Frequency Min 1X/week     Co-evaluation               AM-PAC PT 6 Clicks Mobility  Outcome Measure Help needed turning from your back to your side while in a flat bed without using bedrails?: None Help needed moving from lying on your back to sitting on the side of a flat bed without using bedrails?: None Help needed moving to and from a bed to a chair (including a wheelchair)?: None Help needed standing up from a chair using your arms (e.g., wheelchair or bedside chair)?: None Help needed to walk in hospital room?: None Help needed climbing 3-5 steps with a railing? : None 6 Click Score: 24    End of Session Equipment Utilized During Treatment: Gait belt;Oxygen  Activity Tolerance: Other (comment) (limited by SOB and SpO2 desaturation) Patient left: in chair;with call bell/phone within reach;with chair alarm set;with SCD's reapplied Nurse Communication: Mobility status;Other (comment) (MD and  nursing notified of SpO2 desat with exertion per above) PT Visit Diagnosis: Difficulty in walking, not elsewhere classified (R26.2)    Time: 9092-9071 PT Time Calculation (min) (ACUTE ONLY): 21 min   Charges:   PT Evaluation $PT Eval Moderate Complexity: 1 Mod   PT General Charges $$ ACUTE PT VISIT: 1 Visit    D. Scott Gaynel Schaafsma PT, DPT 09/04/24, 10:08 AM

## 2024-09-04 NOTE — Progress Notes (Addendum)
 [facility]  - [place] [facility]   PATIENT NAME: Angela Jensen    MR#:  [id number]  DATE OF BIRTH:  Dec 08, 1955  SUBJECTIVE:  no family at bedside. Patient sitting up in the recliner and seen earlier eating breakfast. Feels a lot better. Remains on oxygen  denies any wheezing at present. Has history of chronic COPD with history of radiation induced fibrosis.   VITALS:  Blood pressure 116/73, pulse 98, temperature 98.1 F (36.7 C), resp. rate 16, height 4' 11 (1.499 m), weight 49.3 kg, SpO2 92%.  PHYSICAL EXAMINATION:   GENERAL:  69 y.o.-year-old patient with no acute distress.  LUNGS: distant breath sounds bilaterally, no wheezing CARDIOVASCULAR: S1, S2 normal. No murmur   ABDOMEN: Soft, nontender, nondistended. Bowel sounds present.  EXTREMITIES: No  edema b/l.    NEUROLOGIC: nonfocal  patient is alert and awake   LABORATORY PANEL:  CBC Recent Labs  Lab 09/04/24 0544  WBC 8.7  HGB 10.9*  HCT 36.8  PLT 318    Chemistries  Recent Labs  Lab 09/03/24 0545 09/03/24 1100 09/04/24 0544  NA 142  --  144  K 3.6  --  3.7  CL 101  --  98  CO2 32  --  34*  GLUCOSE 198*  --  112*  BUN 10  --  14  CREATININE 0.59   < > 0.58  CALCIUM  9.5  --  9.9  AST 21  --   --   ALT 15  --   --   ALKPHOS 54  --   --   BILITOT 0.5  --   --    < > = values in this interval not displayed.   Cardiac Enzymes No results for input(s): TROPONINI in the last 168 hours. RADIOLOGY:  DG Chest Port 1 View Result Date: 09/03/2024 EXAM: 1 VIEW(S) XRAY OF THE CHEST 09/03/2024 06:02:00 AM COMPARISON: 08/10/2024 CLINICAL HISTORY: SHOB SHOB SHOB SHOB SHOB FINDINGS: LINES, TUBES AND DEVICES: Cardiac loop recorder noted. LUNGS AND PLEURA: Right upper lobe scarring, most likely representing post-radiation fibrosis or post-inflammatory changes. Persistent, chronic airspace opacity involving the right lower lobe. This corresponds to an area of prior external beam radiation in a patient  who has a history of lung cancer. The appearance is stable from 08/10/2024. Trace right pleural effusion, which could be related to prior radiation, heart failure, or other causes. HEART AND MEDIASTINUM: No acute abnormality of the cardiac and mediastinal silhouettes. BONES AND SOFT TISSUES: No acute osseous abnormality. IMPRESSION: 1. Stable chronic right lower lobe airspace opacity in the prior radiation field, favored post-radiation change; differential includes recurrent malignancy or superimposed infection, and follow-up chest CT is recommended if there are new or worsening symptoms or per oncology surveillance. 2. Trace right pleural effusion. Electronically signed by: Waddell Calk MD 09/03/2024 06:18 AM EST RP Workstation: [id number]    Assessment and Plan  69 year old female double/PMH of asthma/COPD on home oxygen  5 L via nasal cannula, CHF, HTN, HLD, anxiety/depression who came into ED complaining of worsening shortness of breath.    Acute exacerbation of COPD/chronic hypoxemic respiratory failure on home oxygen  palliative by nasal cannula Pulmonary fibrosis/Radiation induced - pt was recently admitted and discharged for the similar complaints. - continued on nebulization, IV steroid, antibiotics. - Continue oxygen  to maintain saturation more than 90% -patient overall feeling better. Able to complete sentence without getting short winded. Sats drop with exertion however recover quickly.   Chronic diastolic CHF -continue lease  Diabetes-2 - Continue her insulin  Lantus  and sliding scale. - Continue to monitor blood sugars   Anxiety/depression - Continue alprazolam  and citalopram at home dose.    HTN/HLD - Continue amlodipine  2.5 mg daily and atorvastatin  10 mg daily. - Continue monitor blood pressure    Anemia - Continue ferrous sulfate  at home dose.   Overall improving if remains stable she can discharge tomorrow. Patient agreeable.        Procedures: Family  communication : none at bedside Consults : none CODE STATUS: full DVT Prophylaxis : Lovenox  Level of care: Telemetry Status is: Inpatient Remains inpatient appropriate because: for COPD exacerbation. Remains stable discharge Tuesday    TOTAL TIME TAKING CARE OF THIS PATIENT: 40 minutes.  >50% time spent on counselling and coordination of care  Note: This dictation was prepared with Dragon dictation along with smaller phrase technology. Any transcriptional errors that result from this process are unintentional.  Leita Blanch M.D    [facility]   CC: Primary care physician; Liana Fish, NP

## 2024-09-05 DIAGNOSIS — J441 Chronic obstructive pulmonary disease with (acute) exacerbation: Secondary | ICD-10-CM | POA: Diagnosis not present

## 2024-09-05 LAB — GLUCOSE, CAPILLARY
Glucose-Capillary: 207 mg/dL — ABNORMAL HIGH (ref 70–99)
Glucose-Capillary: 201 mg/dL — ABNORMAL HIGH (ref 70–99)
Glucose-Capillary: 135 mg/dL — ABNORMAL HIGH (ref 70–99)
Glucose-Capillary: 350 mg/dL — ABNORMAL HIGH (ref 70–99)

## 2024-09-05 MED ORDER — PREDNISONE 20 MG PO TABS
40.0000 mg | ORAL_TABLET | Freq: Every day | ORAL | Status: AC
  Administered 2024-09-05 – 2024-09-07 (×3): 40 mg via ORAL
  Filled 2024-09-05 (×3): qty 2

## 2024-09-05 NOTE — Progress Notes (Signed)
 Mobility Specialist Progress Note:    09/05/24 1400  Mobility  Activity Pivoted/transferred to/from [facility]  Level of Assistance Modified independent, requires aide device or extra time  Assistive Device None  Range of Motion/Exercises Active;All extremities  Activity Response Tolerated well  Mobility visit 1 Mobility  Mobility Specialist Start Time (ACUTE ONLY) 1351  Mobility Specialist Stop Time (ACUTE ONLY) 1359  Mobility Specialist Time Calculation (min) (ACUTE ONLY) 8 min   MS responded to bed alarm on, pt ModI to stand and transfer to [facility]. Tolerated well, left pt sitting EOB and notified NT. All needs met.  Sherrilee Ditty Mobility Specialist Please contact via Special Educational Needs Teacher or  Rehab office at [phone]

## 2024-09-05 NOTE — Progress Notes (Addendum)
 "    Progress Note    Angela Jensen  FMW:[id number] DOB: 11/02/1955  DOA: 09/03/2024 PCP: Liana Fish, NP      Brief Narrative:    Medical records reviewed and are as summarized below:  Angela Jensen is a 69 y.o. female with medical history significant for asthma, COPD, OSA, chronic hypoxic and hypercapnic respiratory failure on 5 L of oxygen  and ?CPAP/NIV at night, radiation-induced pulmonary fibrosis, chronic HFpEF, hypertension, dyslipidemia, osteoarthritis.  She was recently discharged from the hospital on 08/18/2024 after hospitalization on 08/10/2024 for sepsis secondary to pneumonia, COPD exacerbation and acute respiratory failure.  She presented to the hospital again on 1/18/2026Because of shortness of breath.  She used her nebulizer at home without any relief.  She said that she is supposed to use  to sleep at night.  However, she had not used it about 2 nights prior to admission. to sleep at night but she did not use it about 2 days prior to admission.  She says she has had this  at night.  However, she had not used this about 2 nights prior to admission. for about a year and was prescribed by Dr. Parris, her pulmonologist.    Assessment/Plan:   Principal Problem:   COPD exacerbation (HCC) Active Problems:   Acute on chronic respiratory failure with hypoxia and hypercapnia (HCC)   Chronic combined systolic and diastolic CHF (congestive heart failure) (HCC)   HTN (hypertension)   Mixed hyperlipidemia   Anxiety and depression   MGUS (monoclonal gammopathy of unknown significance)     Body mass index is 21.95 kg/m.   COPD exacerbation, chronic hypoxic and hypercapnic respiratory failure: Continue prednisone , empiric IV antibiotics and bronchodilators. She is on 4 L oxygen . She uses 5 L oxygen  via [place] at baseline. She said she also uses a machine to sleep at night.  However, she had not used it for about 2 nights prior to admission. Patient has been  encouraged to use CPAP/NIV every night to reduce the risk of readmissions and mortality. BiPAP nightly has been ordered.   Chronic HFpEF: Compensated. Continue Lasix . 2D echo in June 2025 showed EF estimated at 60 to 65%, grade 1 diastolic dysfunction   Type II DM: Continue Lantus  and NovoLog  as needed.   Comorbidities include hypertension, hyperlipidemia, radiation-induced pulmonary fibrosis, MGUS   Discussed disposition plans.  She says she is not comfortable going home today.  Will keep her in the hospital for another day.    Diet Order             Diet Carb Modified Room service appropriate? Yes  Diet effective now                                  Consultants: None  Procedures: None    Medications:    amLODipine   2.5 mg Oral Daily   atorvastatin   10 mg Oral Daily   calcium  carbonate  500 mg of elemental calcium  Oral BID WC   clopidogrel   75 mg Oral Daily   enoxaparin  (LOVENOX ) injection  40 mg Subcutaneous Q24H   escitalopram   10 mg Oral Daily   ferrous sulfate   325 mg Oral Q breakfast   furosemide   20 mg Oral BID   insulin  aspart  0-5 Units Subcutaneous QHS   insulin  aspart  0-9 Units Subcutaneous TID WC   insulin  glargine  10 Units Subcutaneous Daily  losartan   50 mg Oral Daily   metoprolol  succinate  50 mg Oral Daily   pantoprazole   40 mg Oral Daily   Continuous Infusions:  cefTRIAXone  (ROCEPHIN )  IV 1 g (09/04/24 1421)   doxycycline  (VIBRAMYCIN ) IV 100 mg (09/05/24 1146)     Anti-infectives (From admission, onward)    Start     Dose/Rate Route Frequency Ordered Stop   09/03/24 1500  doxycycline  (VIBRAMYCIN ) 100 mg in sodium chloride  0.9 % 250 mL IVPB        100 mg 125 mL/hr over 120 Minutes Intravenous 2 times daily 09/03/24 1400     09/03/24 1500  cefTRIAXone  (ROCEPHIN ) 1 g in sodium chloride  0.9 % 100 mL IVPB        1 g 200 mL/hr over 30 Minutes Intravenous Every 24 hours 09/03/24 1400 09/06/24 1459   09/03/24 0000   azithromycin  (ZITHROMAX  Z-PAK) 250 MG tablet           09/03/24 9357 09/08/24 2359              Family Communication/Anticipated D/C date and plan/Code Status   DVT prophylaxis: enoxaparin  (LOVENOX ) injection 40 mg Start: 09/03/24 1100 SCDs Start: 09/03/24 1013     Code Status: Full Code  Family Communication: None Disposition Plan: Plan to discharge home   Status is: Inpatient Remains inpatient appropriate because: COPD exacerbation       Subjective:   Interval events noted.  She complains of shortness of breath especially with exertion.  She said she uses 5 L oxygen  at home.  She said she is supposed to use machine to sleep at night.  She got the machine about a year ago and had been using it at night.  However, she said she had not used it about 2 nights prior to her coming to the hospital.  Objective:    Vitals:   09/04/24 1411 09/04/24 2022 09/05/24 0419 09/05/24 0824  BP: 116/73 124/79 127/79 (!) 164/92  Pulse: 98 82 69 79  Resp: 16 20 20 16   Temp: 98.1 F (36.7 C) 98.7 F (37.1 C) 98 F (36.7 C) 98 F (36.7 C)  TempSrc:  Oral Oral   SpO2: 92% 95% 98% 96%  Weight:      Height:       No data found.   Intake/Output Summary (Last 24 hours) at 09/05/2024 1329 Last data filed at 09/05/2024 0900 Gross per 24 hour  Intake 290 ml  Output --  Net 290 ml   Filed Weights   09/03/24 0550 09/03/24 2000  Weight: 53.1 kg 49.3 kg    Exam:  GEN: NAD SKIN: Warm and dry EYES: No pallor or icterus ENT: MMM CV: RRR PULM: Bibasilar rales.  No wheezing ABD: soft, ND, NT, +BS CNS: AAO x 3, non focal EXT: No edema or tenderness        Data Reviewed:   I have personally reviewed following labs and imaging studies:  Labs: Labs show the following:   Basic Metabolic Panel: Recent Labs  Lab 09/03/24 0545 09/03/24 1100 09/04/24 0544  NA 142  --  144  K 3.6  --  3.7  CL 101  --  98  CO2 32  --  34*  GLUCOSE 198*  --  112*  BUN 10  --  14   CREATININE 0.59 0.61 0.58  CALCIUM  9.5  --  9.9   GFR Estimated Creatinine Clearance: 45.9 mL/min (by C-G formula based on SCr of 0.58 mg/dL). Liver  Function Tests: Recent Labs  Lab 09/03/24 0545  AST 21  ALT 15  ALKPHOS 54  BILITOT 0.5  PROT 6.7  ALBUMIN 4.1   No results for input(s): LIPASE, AMYLASE in the last 168 hours. No results for input(s): AMMONIA in the last 168 hours. Coagulation profile No results for input(s): INR, PROTIME in the last 168 hours.  CBC: Recent Labs  Lab 09/03/24 0545 09/03/24 1100 09/04/24 0544  WBC 7.7 9.8 8.7  NEUTROABS 5.8  --   --   HGB 10.5* 10.3* 10.9*  HCT 36.5 34.5* 36.8  MCV 91.7 89.4 89.1  PLT 318 283 318   Cardiac Enzymes: No results for input(s): CKTOTAL, CKMB, CKMBINDEX, TROPONINI in the last 168 hours. BNP (last 3 results) Recent Labs    08/10/24 0342 09/03/24 0545  PROBNP 196.0 270.0   CBG: Recent Labs  Lab 09/04/24 1247 09/04/24 1640 09/04/24 2023 09/05/24 0821 09/05/24 1228  GLUCAP 157* 234* 348* 207* 201*   D-Dimer: No results for input(s): DDIMER in the last 72 hours. Hgb A1c: No results for input(s): HGBA1C in the last 72 hours. Lipid Profile: No results for input(s): CHOL, HDL, LDLCALC, TRIG, CHOLHDL, LDLDIRECT in the last 72 hours. Thyroid  function studies: No results for input(s): TSH, T4TOTAL, T3FREE, THYROIDAB in the last 72 hours.  Invalid input(s): FREET3 Anemia work up: No results for input(s): VITAMINB12, FOLATE, FERRITIN, TIBC, IRON , RETICCTPCT in the last 72 hours. Sepsis Labs: Recent Labs  Lab 09/03/24 0545 09/03/24 1100 09/04/24 0544  WBC 7.7 9.8 8.7    Microbiology No results found for this or any previous visit (from the past 240 hours).  Procedures and diagnostic studies:  No results found.             LOS: 2 days   Dmoni Fortson  [facility]   Pager on [email or web address]. If 7PM-7AM, please contact  night-coverage at [email or web address]     09/05/2024, 1:29 PM           "

## 2024-09-05 NOTE — Care Management Important Message (Signed)
 Important Message  Patient Details  Name: Angela Jensen MRN: [id number] Date of Birth: 10-21-1955   Important Message Given:  Yes - Medicare IM     Horald Birky W, CMA 09/05/2024, 11:54 AM

## 2024-09-05 NOTE — Plan of Care (Signed)
" °  Problem: Education: Goal: Ability to describe self-care measures that may prevent or decrease complications (Diabetes Survival Skills Education) will improve Outcome: Progressing Goal: Individualized Educational Video(s) Outcome: Progressing   Problem: Coping: Goal: Ability to adjust to condition or change in health will improve Outcome: Progressing   Problem: Fluid Volume: Goal: Ability to maintain a balanced intake and output will improve Outcome: Progressing   Problem: Health Behavior/Discharge Planning: Goal: Ability to identify and utilize available resources and services will improve Outcome: Progressing Goal: Ability to manage health-related needs will improve Outcome: Progressing   Problem: Metabolic: Goal: Ability to maintain appropriate glucose levels will improve Outcome: Progressing   Problem: Nutritional: Goal: Maintenance of adequate nutrition will improve Outcome: Progressing Goal: Progress toward achieving an optimal weight will improve Outcome: Progressing   Problem: Skin Integrity: Goal: Risk for impaired skin integrity will decrease Outcome: Progressing   Problem: Tissue Perfusion: Goal: Adequacy of tissue perfusion will improve Outcome: Progressing   Problem: Education: Goal: Knowledge of General Education information will improve Description: Including pain rating scale, medication(s)/side effects and non-pharmacologic comfort measures Outcome: Progressing   Problem: Health Behavior/Discharge Planning: Goal: Ability to manage health-related needs will improve Outcome: Progressing   Problem: Clinical Measurements: Goal: Ability to maintain clinical measurements within normal limits will improve Outcome: Progressing Goal: Will remain free from infection Outcome: Progressing Goal: Diagnostic test results will improve Outcome: Progressing Goal: Respiratory complications will improve Outcome: Progressing Goal: Cardiovascular complication will  be avoided Outcome: Progressing   Problem: Coping: Goal: Level of anxiety will decrease Outcome: Progressing   Problem: Nutrition: Goal: Adequate nutrition will be maintained Outcome: Progressing   Problem: Activity: Goal: Risk for activity intolerance will decrease Outcome: Progressing   Problem: Elimination: Goal: Will not experience complications related to bowel motility Outcome: Progressing Goal: Will not experience complications related to urinary retention Outcome: Progressing   Problem: Safety: Goal: Ability to remain free from injury will improve Outcome: Progressing   Problem: Pain Managment: Goal: General experience of comfort will improve and/or be controlled Outcome: Progressing   Problem: Skin Integrity: Goal: Risk for impaired skin integrity will decrease Outcome: Progressing   "

## 2024-09-05 NOTE — Plan of Care (Signed)
  Problem: Education: Goal: Ability to describe self-care measures that may prevent or decrease complications (Diabetes Survival Skills Education) will improve Outcome: Progressing Goal: Individualized Educational Video(s) Outcome: Progressing   Problem: Coping: Goal: Ability to adjust to condition or change in health will improve Outcome: Progressing   Problem: Fluid Volume: Goal: Ability to maintain a balanced intake and output will improve Outcome: Progressing   Problem: Health Behavior/Discharge Planning: Goal: Ability to identify and utilize available resources and services will improve Outcome: Progressing Goal: Ability to manage health-related needs will improve Outcome: Progressing   Problem: Metabolic: Goal: Ability to maintain appropriate glucose levels will improve Outcome: Progressing   Problem: Nutritional: Goal: Maintenance of adequate nutrition will improve Outcome: Progressing Goal: Progress toward achieving an optimal weight will improve Outcome: Progressing   Problem: Skin Integrity: Goal: Risk for impaired skin integrity will decrease Outcome: Progressing   Problem: Tissue Perfusion: Goal: Adequacy of tissue perfusion will improve Outcome: Progressing   Problem: Education: Goal: Knowledge of General Education information will improve Description: Including pain rating scale, medication(s)/side effects and non-pharmacologic comfort measures Outcome: Progressing

## 2024-09-06 ENCOUNTER — Encounter

## 2024-09-06 ENCOUNTER — Other Ambulatory Visit: Payer: Self-pay | Admitting: Nurse Practitioner

## 2024-09-06 DIAGNOSIS — J449 Chronic obstructive pulmonary disease, unspecified: Secondary | ICD-10-CM

## 2024-09-06 DIAGNOSIS — Z09 Encounter for follow-up examination after completed treatment for conditions other than malignant neoplasm: Secondary | ICD-10-CM

## 2024-09-06 DIAGNOSIS — J441 Chronic obstructive pulmonary disease with (acute) exacerbation: Secondary | ICD-10-CM | POA: Diagnosis not present

## 2024-09-06 LAB — GLUCOSE, CAPILLARY
Glucose-Capillary: 256 mg/dL — ABNORMAL HIGH (ref 70–99)
Glucose-Capillary: 137 mg/dL — ABNORMAL HIGH (ref 70–99)
Glucose-Capillary: 255 mg/dL — ABNORMAL HIGH (ref 70–99)
Glucose-Capillary: 271 mg/dL — ABNORMAL HIGH (ref 70–99)
Glucose-Capillary: 257 mg/dL — ABNORMAL HIGH (ref 70–99)

## 2024-09-06 MED ORDER — DOXYCYCLINE HYCLATE 100 MG PO TABS
100.0000 mg | ORAL_TABLET | Freq: Two times a day (BID) | ORAL | Status: DC
  Administered 2024-09-06 – 2024-09-07 (×3): 100 mg via ORAL
  Filled 2024-09-06 (×3): qty 1

## 2024-09-06 MED ORDER — BUDESON-GLYCOPYRROL-FORMOTEROL 160-9-4.8 MCG/ACT IN AERO
2.0000 | INHALATION_SPRAY | Freq: Two times a day (BID) | RESPIRATORY_TRACT | Status: DC
  Administered 2024-09-06 – 2024-09-07 (×2): 2 via RESPIRATORY_TRACT
  Filled 2024-09-06: qty 5.9

## 2024-09-06 MED ORDER — IPRATROPIUM-ALBUTEROL 0.5-2.5 (3) MG/3ML IN SOLN
3.0000 mL | Freq: Four times a day (QID) | RESPIRATORY_TRACT | Status: DC
  Administered 2024-09-06 (×2): 3 mL via RESPIRATORY_TRACT
  Filled 2024-09-06 (×2): qty 3

## 2024-09-06 MED ORDER — IPRATROPIUM-ALBUTEROL 0.5-2.5 (3) MG/3ML IN SOLN
3.0000 mL | Freq: Three times a day (TID) | RESPIRATORY_TRACT | Status: DC
  Administered 2024-09-07: 3 mL via RESPIRATORY_TRACT
  Filled 2024-09-06: qty 3

## 2024-09-06 NOTE — Plan of Care (Signed)

## 2024-09-06 NOTE — Progress Notes (Signed)
 Physical Therapy Treatment Patient Details Name: Angela Jensen MRN: [id number] DOB: Oct 24, 1955 Today's Date: 09/06/2024   History of Present Illness Pt is a pleasant 69 y.o. female with medical history significant for asthma, COPD on home oxygen  5 L at baseline, heart failure, DM, HTN, HLD, osteoarthritis, OSA who presented to ED complaining of worsening shortness of breath.  MD assessment includes acute COPD exacerbation/chronic hypoxemic respiratory failure.    PT Comments  Pt report she has been up multiple times today (bathroom, meals, etc) but was eager to try a more prolonged bout of ambulation into the hallway.  She ultimately did very well with ~234ft of safe and consistent cadence ambulation with walker (final ~30 ft w/o AD, no LOBs or issues).  Pt on 6L O2 t/o the effort with SpO2 in the 90s while checking during ambulation and at 88% on return to sitting.  Pt did well, will benefit from ongoing PT to address functional limitations.  Continue with POC.     If plan is discharge home, recommend the following: Assist for transportation   Can travel by private vehicle        Equipment Recommendations  None recommended by PT    Recommendations for Other Services       Precautions / Restrictions Precautions Precautions: Fall Restrictions Weight Bearing Restrictions Per Provider Order: No     Mobility  Bed Mobility Overal bed mobility: Independent                  Transfers Overall transfer level: Modified independent Equipment used: Rolling walker (2 wheels)                    Ambulation/Gait Ambulation/Gait assistance: Modified independent (Device/Increase time) Gait Distance (Feet): 250 Feet Assistive device: Rolling walker (2 wheels)         General Gait Details: Pt was able to walk with good confidence and consistent cadence, minimal use of walker and able to maintain SpO2 >88% on 6L t/o ambulation.  Pt with only minimal reported fatigue, able to do  ~30 ft w/o AD and showed good confidence and safety with this as well.   Stairs             Wheelchair Mobility     Tilt Bed    Modified Rankin (Stroke Patients Only)       Balance Overall balance assessment: No apparent balance deficits (not formally assessed)                                          Communication Communication Communication: No apparent difficulties Factors Affecting Communication: Hearing impaired  Cognition Arousal: Alert Behavior During Therapy: WFL for tasks assessed/performed   PT - Cognitive impairments: No apparent impairments                         Following commands: Intact      Cueing Cueing Techniques: Verbal cues  Exercises      General Comments        Pertinent Vitals/Pain Pain Assessment Pain Assessment: No/denies pain    Home Living                          Prior Function            PT Goals (current goals can now  be found in the care plan section) Progress towards PT goals: Progressing toward goals    Frequency    Min 1X/week      PT Plan      Co-evaluation              AM-PAC PT 6 Clicks Mobility   Outcome Measure  Help needed turning from your back to your side while in a flat bed without using bedrails?: None Help needed moving from lying on your back to sitting on the side of a flat bed without using bedrails?: None Help needed moving to and from a bed to a chair (including a wheelchair)?: None Help needed standing up from a chair using your arms (e.g., wheelchair or bedside chair)?: None Help needed to walk in hospital room?: None Help needed climbing 3-5 steps with a railing? : None 6 Click Score: 24    End of Session Equipment Utilized During Treatment: Gait belt;Oxygen  Activity Tolerance: Patient limited by fatigue (6L during ambulation, no DOE, mild fatigue, SpO2 88% post ambulation) Patient left: in chair;with call bell/phone within  reach;with family/visitor present   PT Visit Diagnosis: Difficulty in walking, not elsewhere classified (R26.2)     Time: 8371-8353 PT Time Calculation (min) (ACUTE ONLY): 18 min  Charges:    $Gait Training: 8-22 mins PT General Charges $$ ACUTE PT VISIT: 1 Visit                     Carmin JONELLE Deed, DPT 09/06/2024, 5:42 PM

## 2024-09-06 NOTE — Progress Notes (Signed)
 " PROGRESS NOTE    Angela Jensen  FMW:[id number] DOB: October 11, 1955 DOA: 09/03/2024 PCP: Liana Fish, NP    Brief Narrative:   Angela Jensen is a 69 y.o. female with medical history significant for asthma, COPD, OSA, chronic hypoxic and hypercapnic respiratory failure on 5 L of oxygen  and ?CPAP/NIV at night, radiation-induced pulmonary fibrosis, chronic HFpEF, hypertension, dyslipidemia, osteoarthritis.  She was recently discharged from the hospital on 08/18/2024 after hospitalization on 08/10/2024 for sepsis secondary to pneumonia, COPD exacerbation and acute respiratory failure.   She presented to the hospital again on 1/18/2026Because of shortness of breath.  She used her nebulizer at home without any relief.  She said that she is supposed to use  to sleep at night.  However, she had not used it about 2 nights prior to admission. to sleep at night but she did not use it about 2 days prior to admission.  She says she has had this  at night.  However, she had not used this about 2 nights prior to admission. for about a year and was prescribed by Dr. Parris, her pulmonologist.   Assessment & Plan:   Principal Problem:   COPD exacerbation (HCC) Active Problems:   Acute on chronic respiratory failure with hypoxia and hypercapnia (HCC)   Chronic combined systolic and diastolic CHF (congestive heart failure) (HCC)   HTN (hypertension)   Mixed hyperlipidemia   Anxiety and depression   MGUS (monoclonal gammopathy of unknown significance)   COPD exacerbation, chronic hypoxic and hypercapnic respiratory failure: Lung sounds slowly improving Patient is nonadherent to home therapies Plan: Continue p.o. prednisone  Continue empiric antibiotics Restart home Breztri  Scheduled DuoNebs NIPPV nightly   Chronic HFpEF: Compensated. Continue Lasix . 2D echo in June 2025 showed EF estimated at 60 to 65%, grade 1 diastolic dysfunction     Type II DM: Continue Lantus  and NovoLog  as needed.      Comorbidities include hypertension, hyperlipidemia, radiation-induced pulmonary fibrosis, MGUS       DVT prophylaxis: SQL Code Status: Full Family Communication: Family member at bedside 1/21 Disposition Plan: Status is: Inpatient Remains inpatient appropriate because: COPD exacerbation   Level of care: Telemetry  Consultants:  None  Procedures:  None  Antimicrobials: Doxycycline    Subjective: Seen and examined.  Sitting in bed.  Some conversational dyspnea noted.  No pain complaints  Objective: Vitals:   09/05/24 1849 09/05/24 2100 09/06/24 0331 09/06/24 0803  BP: (!) 141/87 (!) 144/92 (!) 146/83 (!) 159/84  Pulse: 81 82 75 80  Resp: 16 20 18 16   Temp: 98 F (36.7 C) 98.1 F (36.7 C) 98 F (36.7 C) 98.7 F (37.1 C)  TempSrc:  Oral Oral Oral  SpO2: 96% 97% 96% 97%  Weight:   51.6 kg   Height:       No intake or output data in the 24 hours ending 09/06/24 1240 Filed Weights   09/03/24 0550 09/03/24 2000 09/06/24 0331  Weight: 53.1 kg 49.3 kg 51.6 kg    Examination:  General exam: NAD.  Appears frail Respiratory system: Clear to auscultation. Respiratory effort normal. Cardiovascular system: Diminished breath sounds bilaterally.  Normal work of breathing.  No wheeze.  Scattered crackles.  5 L Gastrointestinal system: Thin, soft, NT/ND, normal bowel sounds Central nervous system: Alert and oriented. No focal neurological deficits. Extremities: Symmetric 5 x 5 power. Skin: No rashes, lesions or ulcers Psychiatry: Judgement and insight appear normal. Mood & affect appropriate.     Data Reviewed: I have personally  reviewed following labs and imaging studies  CBC: Recent Labs  Lab 09/03/24 0545 09/03/24 1100 09/04/24 0544  WBC 7.7 9.8 8.7  NEUTROABS 5.8  --   --   HGB 10.5* 10.3* 10.9*  HCT 36.5 34.5* 36.8  MCV 91.7 89.4 89.1  PLT 318 283 318   Basic Metabolic Panel: Recent Labs  Lab 09/03/24 0545 09/03/24 1100 09/04/24 0544  NA 142  --   144  K 3.6  --  3.7  CL 101  --  98  CO2 32  --  34*  GLUCOSE 198*  --  112*  BUN 10  --  14  CREATININE 0.59 0.61 0.58  CALCIUM  9.5  --  9.9   GFR: Estimated Creatinine Clearance: 45.9 mL/min (by C-G formula based on SCr of 0.58 mg/dL). Liver Function Tests: Recent Labs  Lab 09/03/24 0545  AST 21  ALT 15  ALKPHOS 54  BILITOT 0.5  PROT 6.7  ALBUMIN 4.1   No results for input(s): LIPASE, AMYLASE in the last 168 hours. No results for input(s): AMMONIA in the last 168 hours. Coagulation Profile: No results for input(s): INR, PROTIME in the last 168 hours. Cardiac Enzymes: No results for input(s): CKTOTAL, CKMB, CKMBINDEX, TROPONINI in the last 168 hours. BNP (last 3 results) Recent Labs    08/10/24 0342 09/03/24 0545  PROBNP 196.0 270.0   HbA1C: No results for input(s): HGBA1C in the last 72 hours. CBG: Recent Labs  Lab 09/05/24 1758 09/05/24 2221 09/06/24 0058 09/06/24 0801 09/06/24 1151  GLUCAP 135* 350* 256* 137* 255*   Lipid Profile: No results for input(s): CHOL, HDL, LDLCALC, TRIG, CHOLHDL, LDLDIRECT in the last 72 hours. Thyroid  Function Tests: No results for input(s): TSH, T4TOTAL, FREET4, T3FREE, THYROIDAB in the last 72 hours. Anemia Panel: No results for input(s): VITAMINB12, FOLATE, FERRITIN, TIBC, IRON , RETICCTPCT in the last 72 hours. Sepsis Labs: No results for input(s): PROCALCITON, LATICACIDVEN in the last 168 hours.  No results found for this or any previous visit (from the past 240 hours).       Radiology Studies: No results found.      Scheduled Meds:  amLODipine   2.5 mg Oral Daily   atorvastatin   10 mg Oral Daily   budesonide -glycopyrrolate -formoterol   2 puff Inhalation BID   calcium  carbonate  500 mg of elemental calcium  Oral BID WC   clopidogrel   75 mg Oral Daily   doxycycline   100 mg Oral Q12H   enoxaparin  (LOVENOX ) injection  40 mg Subcutaneous Q24H    escitalopram   10 mg Oral Daily   ferrous sulfate   325 mg Oral Q breakfast   furosemide   20 mg Oral BID   insulin  aspart  0-5 Units Subcutaneous QHS   insulin  aspart  0-9 Units Subcutaneous TID WC   insulin  glargine  10 Units Subcutaneous Daily   ipratropium-albuterol   3 mL Nebulization Q6H   losartan   50 mg Oral Daily   metoprolol  succinate  50 mg Oral Daily   pantoprazole   40 mg Oral Daily   predniSONE   40 mg Oral Q breakfast   Continuous Infusions:   LOS: 3 days    Calvin KATHEE Robson, MD [facility]   If 7PM-7AM, please contact night-coverage  09/06/2024, 12:40 PM   "

## 2024-09-07 ENCOUNTER — Other Ambulatory Visit: Payer: Self-pay

## 2024-09-07 DIAGNOSIS — J441 Chronic obstructive pulmonary disease with (acute) exacerbation: Secondary | ICD-10-CM | POA: Diagnosis not present

## 2024-09-07 LAB — GLUCOSE, CAPILLARY: Glucose-Capillary: 176 mg/dL — ABNORMAL HIGH (ref 70–99)

## 2024-09-07 MED ORDER — PREDNISONE 20 MG PO TABS
20.0000 mg | ORAL_TABLET | Freq: Every day | ORAL | 0 refills | Status: DC
  Filled 2024-09-07: qty 3, 3d supply, fill #0

## 2024-09-07 NOTE — Plan of Care (Signed)

## 2024-09-07 NOTE — Plan of Care (Signed)

## 2024-09-07 NOTE — Discharge Summary (Signed)
 Physician Discharge Summary  Angela Jensen FMW:[id number] DOB: [birth year] DOA: 09/03/2024  PCP: Liana Fish, NP  Admit date: 09/03/2024 Discharge date: 09/07/2024  Admitted From: Home Disposition:  Home  Recommendations for Outpatient Follow-up:  Follow up with PCP in 1-2 weeks Follow-up with pulmonary 1 to 2 weeks  Home Health: No Equipment/Devices: Oxygen  5 L  Discharge Condition: Stable CODE STATUS: Full Diet recommendation: Regular  Brief/Interim Summary:  Angela Jensen is a 69 y.o. female with medical history significant for asthma, COPD, OSA, chronic hypoxic and hypercapnic respiratory failure on 5 L of oxygen  and ?CPAP/NIV at night, radiation-induced pulmonary fibrosis, chronic HFpEF, hypertension, dyslipidemia, osteoarthritis.  She was recently discharged from the hospital on 08/18/2024 after hospitalization on 08/10/2024 for sepsis secondary to pneumonia, COPD exacerbation and acute respiratory failure.   She presented to the hospital again on 1/18/2026Because of shortness of breath.  She used her nebulizer at home without any relief.  She said that she is supposed to use  to sleep at night.  However, she had not used it about 2 nights prior to admission. to sleep at night but she did not use it about 2 days prior to admission.  She says she has had this  at night.  However, she had not used this about 2 nights prior to admission. for about a year and was prescribed by Dr. Aleskerov, her pulmonologist.  Discharge Diagnoses:  Principal Problem:   COPD exacerbation (HCC) Active Problems:   Acute on chronic respiratory failure with hypoxia and hypercapnia (HCC)   Chronic combined systolic and diastolic CHF (congestive heart failure) (HCC)   HTN (hypertension)   Mixed hyperlipidemia   Anxiety and depression   MGUS (monoclonal gammopathy of unknown significance)  COPD exacerbation, chronic hypoxic and hypercapnic respiratory failure: Lung sounds slowly  improving Patient is nonadherent to home therapies Plan: Short course of p.o. prednisone .  Can resume home bronchodilator regimen.  Educated on importance of nightly NIPPV use.  Patient will follow-up outpatient pulmonology for further recommendations  Chronic HFpEF: Compensated. Continue Lasix . 2D echo in June 2025 showed EF estimated at 60 to 65%, grade 1 diastolic dysfunction     Discharge Instructions  Discharge Instructions     AMB referral to pulmonary rehabilitation   Complete by: As directed    Please select a program: Pulmonary Rehabilitation   Diagnosis: (See process instructions below for COPD requirements): COPD-Gold 4: Very Severe FEV1 <30% predicted   After initial evaluation and assessments completed: Virtual Based Care may be provided alone or in conjunction with Pulmonary Rehab/Respiratory Care services based on patient barriers.: Yes   Increase activity slowly   Complete by: As directed       Allergies as of 09/07/2024       Reactions   Iodinated Contrast Media Itching   Trelegy Ellipta  [fluticasone -umeclidin-vilant]    Had breathing issues   Bactoshield Chg [chlorhexidine  Gluconate] Itching, Other (See Comments)   Dry. Flaking, peeling skin        Medication List     STOP taking these medications    pregabalin  75 MG capsule Commonly known as: LYRICA    sulfamethoxazole -trimethoprim  400-80 MG tablet Commonly known as: BACTRIM        TAKE these medications    Accu-Chek Guide Test test strip Generic drug: glucose blood USE TO CHECK BLOOD SUGAR 5 TIMES DAILY   Accu-Chek Guide w/Device Kit Use as directed Dx e11.65   Accu-Chek Softclix Lancets lancets Use 1 lancet 3 times daily to check glucose  for diabetes   albuterol  (5 MG/ML) 0.5% nebulizer solution Commonly known as: PROVENTIL  Take 2.5 mg by nebulization every 6 (six) hours as needed for wheezing or shortness of breath.   albuterol  108 (90 Base) MCG/ACT inhaler Commonly known as:  VENTOLIN  HFA INHALE 2 PUFFS BY MOUTH EVERY 6 HOURS AS NEEDED FOR WHEEZING OR SHORTNESS OF BREATH   ALPRAZolam  1 MG tablet Commonly known as: XANAX  Take 1 tablet (1 mg total) by mouth 2 (two) times daily as needed for anxiety or sleep.   amLODipine  2.5 MG tablet Commonly known as: NORVASC  Take 1 tablet (2.5 mg total) by mouth daily.   atorvastatin  10 MG tablet Commonly known as: Lipitor Take 1 tablet (10 mg total) by mouth daily.   budesonide -glycopyrrolate -formoterol  160-9-4.8 MCG/ACT Aero inhaler Commonly known as: BREZTRI  Inhale 2 puffs into the lungs 2 (two) times daily.   calcium  carbonate 1500 (600 Ca) MG Tabs tablet Commonly known as: OSCAL Take 600 mg of elemental calcium  by mouth 2 (two) times daily with a meal.   clopidogrel  75 MG tablet Commonly known as: PLAVIX  Take 1 tablet (75 mg total) by mouth daily.   cyclobenzaprine  10 MG tablet Commonly known as: FLEXERIL  Take 1 tablet (10 mg total) by mouth at bedtime. Take one tab po qhs for back spasm prn only   escitalopram  10 MG tablet Commonly known as: LEXAPRO  Take 1 tablet by mouth once daily   ferrous sulfate  325 (65 FE) MG tablet Take 1 tablet (325 mg total) by mouth daily with breakfast.   furosemide  20 MG tablet Commonly known as: LASIX  Take 1 tablet (20 mg total) by mouth daily as needed.   ipratropium-albuterol  0.5-2.5 (3) MG/3ML Soln Commonly known as: DUONEB USE 1 VIAL IN NEBULIZER EVERY 6 HOURS AS NEEDED What changed: See the new instructions.   losartan  50 MG tablet Commonly known as: COZAAR  Take 1 tablet (50 mg total) by mouth daily.   metFORMIN  500 MG 24 hr tablet Commonly known as: GLUCOPHAGE -XR TAKE 2 TABLETS BY MOUTH ONCE DAILY WITH BREAKFAST   metoprolol  succinate 50 MG 24 hr tablet Commonly known as: TOPROL -XL Take 1 tablet (50 mg total) by mouth daily. Take with or immediately following a meal. May take 1/2 tablet if BP is less than 120/80.   oxyCODONE -acetaminophen  7.5-325 MG  tablet Commonly known as: PERCOCET Take 1 tablet by mouth 2 (two) times daily as needed for severe pain (pain score 7-10).   OXYGEN  Inhale 4 L into the lungs. PT USES [facility] FOR OXYGEN    pantoprazole  40 MG tablet Commonly known as: Protonix  Take 1 tablet (40 mg total) by mouth daily.   potassium chloride  10 MEQ tablet Commonly known as: KLOR-CON  TAKE 1 TABLET BY MOUTH EVERY OTHER DAY   predniSONE  20 MG tablet Commonly known as: DELTASONE  Take 1 tablet (20 mg total) by mouth daily with breakfast for 3 days.   ReliOn Pen Needles 31G X 6 MM Misc Generic drug: Insulin  Pen Needle USE 1  pen needle with insulin  pen ONCE DAILY for diabetes   Toujeo  SoloStar 300 UNIT/ML Solostar Pen Generic drug: insulin  glargine (1 Unit Dial ) INJECT 10 UNITS SUBCUTANEOUSLY ONCE DAILY What changed: See the new instructions.   vitamin B-12 100 MCG tablet Commonly known as: CYANOCOBALAMIN  Take 100 mcg by mouth daily.   VITAMIN D  (CHOLECALCIFEROL ) PO Take 1 tablet by mouth in the morning.        Follow-up Information     Parris Manna, MD. Go on 09/13/2024.  Specialty: Pulmonary Disease Why: Go at 10:45am. Contact information: [address] [place] [address] [phone]         Liana Fish, NP. Go on 09/08/2024.   Specialty: Nurse Practitioner Why: Go at 11:15am.You already had this appointment  schedule the office said you can use this as your hosiptal follow up too Contact information: [address] [place] [address] [phone]                Allergies[1]  Consultations: None   Procedures/Studies: DG Chest Port 1 View Result Date: 09/03/2024 EXAM: 1 VIEW(S) XRAY OF THE CHEST 09/03/2024 06:02:00 AM COMPARISON: 08/10/2024 CLINICAL HISTORY: SHOB SHOB SHOB SHOB SHOB FINDINGS: LINES, TUBES AND DEVICES: Cardiac loop recorder noted. LUNGS AND PLEURA: Right upper lobe scarring, most likely representing post-radiation fibrosis or  post-inflammatory changes. Persistent, chronic airspace opacity involving the right lower lobe. This corresponds to an area of prior external beam radiation in a patient who has a history of lung cancer. The appearance is stable from 08/10/2024. Trace right pleural effusion, which could be related to prior radiation, heart failure, or other causes. HEART AND MEDIASTINUM: No acute abnormality of the cardiac and mediastinal silhouettes. BONES AND SOFT TISSUES: No acute osseous abnormality. IMPRESSION: 1. Stable chronic right lower lobe airspace opacity in the prior radiation field, favored post-radiation change; differential includes recurrent malignancy or superimposed infection, and follow-up chest CT is recommended if there are new or worsening symptoms or per oncology surveillance. 2. Trace right pleural effusion. Electronically signed by: Waddell Calk MD 09/03/2024 06:18 AM EST RP Workstation: [id number]   DG Chest Port 1 View Result Date: 08/10/2024 EXAM: 1 VIEW(S) XRAY OF THE CHEST 08/10/2024 03:38:00 AM COMPARISON: 04/21/2024 CLINICAL HISTORY: Questionable sepsis - evaluate for abnormality FINDINGS: LUNGS AND PLEURA: New right lower lung zone airspace opacity. Background emphysema. Possible trace right pleural effusion. Right upper lobe scarring. No pneumothorax. HEART AND MEDIASTINUM: Cardiac device noted. No acute abnormality of the cardiac and mediastinal silhouettes. BONES AND SOFT TISSUES: No acute osseous abnormality. IMPRESSION: 1. Right lower lung airspace opacity most likely represents pneumonia. Follow up in 6 - 8 weeks after treatment is recommended to exclude underlying mass. Electronically signed by: Norman Gatlin MD 08/10/2024 03:50 AM EST RP Workstation: [id number]      Subjective: Seen and examined the day of discharge.  Stable no distress.  Appropriate discharge home.  Discharge Exam: Vitals:   09/07/24 0737 09/07/24 0805  BP:  106/68  Pulse:  78  Resp:  20  Temp:  98 F  (36.7 C)  SpO2: 94% 97%   Vitals:   09/06/24 2020 09/07/24 0303 09/07/24 0737 09/07/24 0805  BP: 115/69 (!) 156/93  106/68  Pulse: 75 68  78  Resp: 18 17  20   Temp: 98 F (36.7 C) 97.7 F (36.5 C)  98 F (36.7 C)  TempSrc:      SpO2: 95% 99% 94% 97%  Weight:      Height:        General: Pt is alert, awake, not in acute distress Cardiovascular: RRR, S1/S2 +, no rubs, no gallops Respiratory: CTA bilaterally, no wheezing, no rhonchi Abdominal: Soft, NT, ND, bowel sounds + Extremities: no edema, no cyanosis    The results of significant diagnostics from this hospitalization (including imaging, microbiology, ancillary and laboratory) are listed below for reference.     Microbiology: No results found for this or any previous visit (from the past 240 hours).   Labs: BNP (last 3  results) Recent Labs    03/31/24 1848 04/09/24 0834 04/18/24 0622  BNP 57.5 259.4* 146.8*   Basic Metabolic Panel: Recent Labs  Lab 09/03/24 0545 09/03/24 1100 09/04/24 0544  NA 142  --  144  K 3.6  --  3.7  CL 101  --  98  CO2 32  --  34*  GLUCOSE 198*  --  112*  BUN 10  --  14  CREATININE 0.59 0.61 0.58  CALCIUM  9.5  --  9.9   Liver Function Tests: Recent Labs  Lab 09/03/24 0545  AST 21  ALT 15  ALKPHOS 54  BILITOT 0.5  PROT 6.7  ALBUMIN 4.1   No results for input(s): LIPASE, AMYLASE in the last 168 hours. No results for input(s): AMMONIA in the last 168 hours. CBC: Recent Labs  Lab 09/03/24 0545 09/03/24 1100 09/04/24 0544  WBC 7.7 9.8 8.7  NEUTROABS 5.8  --   --   HGB 10.5* 10.3* 10.9*  HCT 36.5 34.5* 36.8  MCV 91.7 89.4 89.1  PLT 318 283 318   Cardiac Enzymes: No results for input(s): CKTOTAL, CKMB, CKMBINDEX, TROPONINI in the last 168 hours. BNP: Invalid input(s): POCBNP CBG: Recent Labs  Lab 09/06/24 0801 09/06/24 1151 09/06/24 1708 09/06/24 2111 09/07/24 0806  GLUCAP 137* 255* 271* 257* 176*   D-Dimer No results for input(s):  DDIMER in the last 72 hours. Hgb A1c No results for input(s): HGBA1C in the last 72 hours. Lipid Profile No results for input(s): CHOL, HDL, LDLCALC, TRIG, CHOLHDL, LDLDIRECT in the last 72 hours. Thyroid  function studies No results for input(s): TSH, T4TOTAL, T3FREE, THYROIDAB in the last 72 hours.  Invalid input(s): FREET3 Anemia work up No results for input(s): VITAMINB12, FOLATE, FERRITIN, TIBC, IRON , RETICCTPCT in the last 72 hours. Urinalysis    Component Value Date/Time   COLORURINE STRAW (A) 04/17/2023 0930   APPEARANCEUR CLEAR (A) 04/17/2023 0930   APPEARANCEUR Clear 08/06/2022 1541   LABSPEC 1.014 04/17/2023 0930   PHURINE 6.0 04/17/2023 0930   GLUCOSEU NEGATIVE 04/17/2023 0930   HGBUR MODERATE (A) 04/17/2023 0930   BILIRUBINUR NEGATIVE 04/17/2023 0930   BILIRUBINUR Negative 08/06/2022 1541   KETONESUR NEGATIVE 04/17/2023 0930   PROTEINUR NEGATIVE 04/17/2023 0930   UROBILINOGEN 0.2 04/22/2021 0959   NITRITE NEGATIVE 04/17/2023 0930   LEUKOCYTESUR NEGATIVE 04/17/2023 0930   Sepsis Labs Recent Labs  Lab 09/03/24 0545 09/03/24 1100 09/04/24 0544  WBC 7.7 9.8 8.7   Microbiology No results found for this or any previous visit (from the past 240 hours).   Time coordinating discharge: 40 minutes  SIGNED:   Calvin KATHEE Robson, MD  [facility] 09/07/2024, 12:22 PM Pager   If 7PM-7AM, please contact night-coverage     [1]  Allergies Allergen Reactions   Iodinated Contrast Media Itching   Trelegy Ellipta  [Fluticasone -Umeclidin-Vilant]     Had breathing issues   Bactoshield Chg [Chlorhexidine  Gluconate] Itching and Other (See Comments)    Dry. Flaking, peeling skin

## 2024-09-07 NOTE — TOC Transition Note (Signed)
 Transition of Care [facility]) - Discharge Note   Patient Details  Name: Angela Jensen MRN: [id number] Date of Birth: July 18, 1956  Transition of Care [facility]) CM/SW Contact:  Corean ONEIDA Haddock, RN Phone Number: 09/07/2024, 9:32 AM   Clinical Narrative:     Patient to dc today.  Per MD Outpatient referral to pulmonary rehab. And dc meds will be delivered meds to bed.  No CM DC needs identified         Patient Goals and CMS Choice            Discharge Placement                       Discharge Plan and Services Additional resources added to the After Visit Summary for                                       Social Drivers of Health (SDOH) Interventions SDOH Screenings   Food Insecurity: No Food Insecurity (09/03/2024)  Recent Concern: Food Insecurity - Food Insecurity Present (08/28/2024)   Received from [facility]  Housing: Low Risk (09/03/2024)  Transportation Needs: No Transportation Needs (09/03/2024)  Utilities: Not At Risk (09/03/2024)  Alcohol Screen: Low Risk (12/11/2021)  Depression (PHQ2-9): Medium Risk (04/13/2024)  Financial Resource Strain: Low Risk  (08/28/2024)   Received from [facility]  Recent Concern: Financial Resource Strain - Medium Risk (06/04/2024)   Received from [facility]  Social Connections: Moderately Isolated (09/03/2024)  Tobacco Use: Medium Risk (09/03/2024)     Readmission Risk Interventions    09/04/2024    9:50 AM 08/11/2024   11:06 AM 04/19/2024   12:06 PM  Readmission Risk Prevention Plan  Transportation Screening Complete Complete Complete  Medication Review Oceanographer) Complete Complete Complete  PCP or Specialist appointment within 3-5 days of discharge Complete Complete Complete  HRI or Home Care Consult Complete Complete   SW Recovery Care/Counseling Consult Complete Complete Complete  Palliative Care Screening Not Applicable Not Applicable Not Applicable  Skilled Nursing  Facility Not Applicable Not Applicable Not Applicable

## 2024-09-08 ENCOUNTER — Encounter: Payer: Self-pay | Admitting: Nurse Practitioner

## 2024-09-08 ENCOUNTER — Ambulatory Visit: Admitting: Nurse Practitioner

## 2024-09-08 VITALS — BP 125/74 | HR 119 | Temp 98.1°F | Resp 16 | Ht 59.0 in | Wt 114.0 lb

## 2024-09-08 DIAGNOSIS — Z09 Encounter for follow-up examination after completed treatment for conditions other than malignant neoplasm: Secondary | ICD-10-CM | POA: Diagnosis not present

## 2024-09-08 DIAGNOSIS — J441 Chronic obstructive pulmonary disease with (acute) exacerbation: Secondary | ICD-10-CM

## 2024-09-08 DIAGNOSIS — F419 Anxiety disorder, unspecified: Secondary | ICD-10-CM | POA: Diagnosis not present

## 2024-09-08 DIAGNOSIS — Z760 Encounter for issue of repeat prescription: Secondary | ICD-10-CM

## 2024-09-08 DIAGNOSIS — E1169 Type 2 diabetes mellitus with other specified complication: Secondary | ICD-10-CM

## 2024-09-08 DIAGNOSIS — I739 Peripheral vascular disease, unspecified: Secondary | ICD-10-CM

## 2024-09-08 DIAGNOSIS — Z794 Long term (current) use of insulin: Secondary | ICD-10-CM | POA: Diagnosis not present

## 2024-09-08 DIAGNOSIS — Z79899 Other long term (current) drug therapy: Secondary | ICD-10-CM

## 2024-09-08 MED ORDER — LOSARTAN POTASSIUM 50 MG PO TABS: 50.0000 mg | ORAL_TABLET | Freq: Every day | ORAL | 1 refills | Status: AC

## 2024-09-08 MED ORDER — METFORMIN HCL ER 500 MG PO TB24: ORAL_TABLET | ORAL | 3 refills | Status: AC

## 2024-09-08 MED ORDER — ALPRAZOLAM 1 MG PO TABS: 1.0000 mg | ORAL_TABLET | Freq: Two times a day (BID) | ORAL | 2 refills | Status: AC | PRN

## 2024-09-08 MED ORDER — AMLODIPINE BESYLATE 2.5 MG PO TABS: 2.5000 mg | ORAL_TABLET | Freq: Every day | ORAL | 3 refills | Status: AC

## 2024-09-08 MED ORDER — CLOPIDOGREL BISULFATE 75 MG PO TABS: 75.0000 mg | ORAL_TABLET | Freq: Every day | ORAL | 3 refills | Status: AC

## 2024-09-08 MED ORDER — FUROSEMIDE 20 MG PO TABS: 20.0000 mg | ORAL_TABLET | Freq: Every day | ORAL | 5 refills | Status: AC | PRN

## 2024-09-08 NOTE — Progress Notes (Unsigned)
 [facility] [place], [place] [address] [place] [address] [phone]                                   Transitional Care Clinic   [facility] Discharge Acute Issues Care Follow Up                                                                        Patient Demographics  Angela Jensen, is a 69 y.o. female  DOB 08/05/1956  MRN [id number].  Primary MD  Liana Fish, NP  Admit date: 09/03/2024 Discharge date: 09/07/2024  Reason for TCC follow Up - COPD exacerbation   Past Medical History:  Diagnosis Date   Anemia    Arthritis    Asthma    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   Cancer [facility]) 2012   Right Lung CA   COPD (chronic obstructive pulmonary disease) (HCC)    Depression    Diabetes mellitus without complication (HCC)    Patient takes Janumet   Essential hypertension 05/26/2016   Heart failure (HCC) 2022   Hydropneumothorax 05/03/2020   Hypercholesteremia    Oxygen  dependent    2L at nite    PAD (peripheral artery disease) 06/22/2016   Peripheral vascular disease    Personal history of radiation therapy    Shortness of breath dyspnea    with exertion    Sialolithiasis    Sleep apnea    Wears dentures    full upper and lower    Past Surgical History:  Procedure Laterality Date   APPLICATION OF CELL SAVER Left 01/21/2024   Procedure: APPLICATION OF CELL SAVER;  Surgeon: Marea Selinda RAMAN, MD;  Location: [facility];  Service: Vascular;  Laterality: Left;   CESAREAN SECTION     x3   COLONOSCOPY N/A 04/03/2024   Procedure: COLONOSCOPY;  Surgeon: Jinny Carmine, MD;  Location: [facility];  Service: Endoscopy;  Laterality: N/A;   COLONOSCOPY WITH PROPOFOL  N/A 06/25/2015   Procedure: COLONOSCOPY WITH PROPOFOL ;  Surgeon: Carmine Jinny, MD;  Location: [facility];  Service: Endoscopy;  Laterality: N/A;   COLONOSCOPY WITH PROPOFOL  N/A 07/26/2020   Procedure: COLONOSCOPY WITH PROPOFOL ;   Surgeon: Jinny Carmine, MD;  Location: [facility];  Service: Endoscopy;  Laterality: N/A;   CYST REMOVAL LEG     and on shoulder    ENDARTERECTOMY FEMORAL Left 01/21/2024   Procedure: LEFT COMMON, SUPERFICIAL FEMORAL AND PROFUNDA FEMORIS ENDARTERECTOMY WITH BOVINE PERICARDIAL PATCH ANGIOPLASTY;  Surgeon: Marea Selinda RAMAN, MD;  Location: [facility];  Service: Vascular;  Laterality: Left;   ESOPHAGOGASTRODUODENOSCOPY (EGD) WITH PROPOFOL  N/A 07/26/2020   Procedure: ESOPHAGOGASTRODUODENOSCOPY (EGD) WITH PROPOFOL ;  Surgeon: Jinny Carmine, MD;  Location: [facility];  Service: Endoscopy;  Laterality: N/A;  Diabetic - oral meds   INSERTION OF ILIAC STENT Left 01/21/2024   Procedure: INSERTION, STENT, ARTERY, ILIAC;  Surgeon: Marea Selinda RAMAN, MD;  Location: [facility];  Service: Vascular;  Laterality: Left;  AND SFA STENTS   LOWER EXTREMITY ANGIOGRAPHY Left 09/29/2018   Procedure: LOWER EXTREMITY ANGIOGRAPHY;  Surgeon: Marea Selinda RAMAN, MD;  Location: [facility];  Service: Cardiovascular;  Laterality: Left;   LOWER EXTREMITY ANGIOGRAPHY Left 07/29/2023   Procedure: Lower Extremity Angiography;  Surgeon: Marea Selinda RAMAN, MD;  Location: [facility];  Service: Cardiovascular;  Laterality: Left;   LOWER EXTREMITY ANGIOGRAPHY Right 12/20/2023   Procedure: Lower Extremity Angiography;  Surgeon: Marea Selinda RAMAN, MD;  Location: [facility];  Service: Cardiovascular;  Laterality: Right;   LOWER EXTREMITY ANGIOGRAPHY Left 01/17/2024   Procedure: Lower Extremity Angiography;  Surgeon: Marea Selinda RAMAN, MD;  Location: [facility];  Service: Cardiovascular;  Laterality: Left;   LUNG BIOPSY  12/30/2011   has lung spots   PACEMAKER IMPLANT  07/14/2021   PACEMAKER LEADLESS INSERTION N/A 07/14/2021   Procedure: PACEMAKER LEADLESS INSERTION;  Surgeon: Ammon Blunt, MD;  Location: [facility];  Service: Cardiovascular;  Laterality: N/A;   PERIPHERAL VASCULAR CATHETERIZATION Left  06/01/2016   Procedure: Lower Extremity Angiography;  Surgeon: Selinda RAMAN Marea, MD;  Location: [facility];  Service: Cardiovascular;  Laterality: Left;   PERIPHERAL VASCULAR CATHETERIZATION N/A 06/01/2016   Procedure: Abdominal Aortogram w/Lower Extremity;  Surgeon: Selinda RAMAN Marea, MD;  Location: [facility];  Service: Cardiovascular;  Laterality: N/A;   PERIPHERAL VASCULAR CATHETERIZATION  06/01/2016   Procedure: Lower Extremity Intervention;  Surgeon: Selinda RAMAN Marea, MD;  Location: [facility];  Service: Cardiovascular;;   PERIPHERAL VASCULAR CATHETERIZATION Right 06/08/2016   Procedure: Lower Extremity Angiography;  Surgeon: Selinda RAMAN Marea, MD;  Location: [facility];  Service: Cardiovascular;  Laterality: Right;   PERIPHERAL VASCULAR CATHETERIZATION  06/08/2016   Procedure: Lower Extremity Intervention;  Surgeon: Selinda RAMAN Marea, MD;  Location: [facility];  Service: Cardiovascular;;   POLYPECTOMY  04/03/2024   Procedure: POLYPECTOMY, INTESTINE;  Surgeon: Jinny Carmine, MD;  Location: [facility];  Service: Endoscopy;;   SUBMANDIBULAR GLAND EXCISION Left 12/06/2020   Procedure: EXCISION SUBMANDIBULAR GLAND;  Surgeon: Herminio Miu, MD;  Location: [facility];  Service: ENT;  Laterality: Left;  needs to be first case Diabetic - diet controlled   TEMPORARY PACEMAKER N/A 07/11/2021   Procedure: TEMPORARY PACEMAKER;  Surgeon: Ammon Blunt, MD;  Location: [facility];  Service: Cardiovascular;  Laterality: N/A;       Recent HPI and Hospital Course  Brief/Interim Summary:  Angela Jensen is a 69 y.o. female with medical history significant for asthma, COPD, OSA, chronic hypoxic and hypercapnic respiratory failure on 5 L of oxygen  and ?CPAP/NIV at night, radiation-induced pulmonary fibrosis, chronic HFpEF, hypertension, dyslipidemia, osteoarthritis.  She was recently discharged from the hospital on 08/18/2024 after hospitalization on  08/10/2024 for sepsis secondary to pneumonia, COPD exacerbation and acute respiratory failure.   She presented to the hospital again on 1/18/2026Because of shortness of breath.  She used her nebulizer at home without any relief.  She said that she is supposed to use  to sleep at night.  However, she had not used it about 2 nights prior to admission. to sleep at night but she did not use it about 2 days prior to admission.  She says she has had this  at night.  However, she had not used this about 2 nights prior to admission. for about a year and was prescribed by Dr. Aleskerov, her pulmonologist.    [facility] Acute Care Issue to be followed in the Clinic   COPD exacerbation [facility]) Active Problems:   Acute on chronic respiratory failure with hypoxia and  hypercapnia (HCC)   Chronic combined systolic and diastolic CHF (congestive heart failure) (HCC)   HTN (hypertension)   Mixed hyperlipidemia   Anxiety and depression   MGUS (monoclonal gammopathy of unknown significance)   Subjective:   Ronal Brain today has, No headache, No chest pain, No abdominal pain - No Nausea, No new weakness tingling or numbness, No Cough - SOB.   Assessment & Plan    1. COPD with acute exacerbation (HCC) (Primary) Continue medications as prescribed. Follow up with pulmonologist as scheduled.   2. Type 2 diabetes mellitus with other specified complication, with long-term current use of insulin  (HCC) Continue metformin  as prescribed.  - metFORMIN  (GLUCOPHAGE -XR) 500 MG 24 hr tablet; TAKE 2 TABLETS BY MOUTH ONCE DAILY WITH BREAKFAST  Dispense: 180 tablet; Refill: 3  3. PVD (peripheral vascular disease) Continue plavix  as prescribed.  - clopidogrel  (PLAVIX ) 75 MG tablet; Take 1 tablet (75 mg total) by mouth daily.  Dispense: 90 tablet; Refill: 3  4. Hospital discharge follow-up Treated for COPD exacerbation in hospital, medication refills ordered  - amLODipine  (NORVASC ) 2.5 MG tablet; Take 1 tablet  (2.5 mg total) by mouth daily.  Dispense: 90 tablet; Refill: 3 - furosemide  (LASIX ) 20 MG tablet; Take 1 tablet (20 mg total) by mouth daily as needed.  Dispense: 30 tablet; Refill: 5 - losartan  (COZAAR ) 50 MG tablet; Take 1 tablet (50 mg total) by mouth daily.  Dispense: 90 tablet; Refill: 1  5. Anxiety Continue alprazolam  as prescribed. Follow up in 3 months for additional refills.  - ALPRAZolam  (XANAX ) 1 MG tablet; Take 1 tablet (1 mg total) by mouth 2 (two) times daily as needed for anxiety or sleep.  Dispense: 60 tablet; Refill: 2   Reason for frequent admissions/ER visits    COPD Hypertension CHF Chronic respiratory failure   Objective:   Vitals:   09/08/24 1141  BP: 125/74  Pulse: (!) 119  Resp: 16  Temp: 98.1 F (36.7 C)  SpO2: 91%  Weight: 114 lb (51.7 kg)  Height: 4' 11 (1.499 m)    Wt Readings from Last 3 Encounters:  09/08/24 114 lb (51.7 kg)  09/06/24 113 lb 12.1 oz (51.6 kg)  08/10/24 120 lb 13 oz (54.8 kg)    Allergies as of 09/08/2024       Reactions   Iodinated Contrast Media Itching   Trelegy Ellipta  [fluticasone -umeclidin-vilant]    Had breathing issues   Bactoshield Chg [chlorhexidine  Gluconate] Itching, Other (See Comments)   Dry. Flaking, peeling skin        Medication List        Accurate as of September 08, 2024 12:08 PM. If you have any questions, ask your nurse or doctor.          STOP taking these medications    cyclobenzaprine  10 MG tablet Commonly known as: FLEXERIL  Stopped by: Kamiyah Kindel, NP   predniSONE  20 MG tablet Commonly known as: DELTASONE  Stopped by: Mardy Maxin, NP       TAKE these medications    Accu-Chek Guide Test test strip Generic drug: glucose blood USE TO CHECK BLOOD SUGAR 5 TIMES DAILY   Accu-Chek Guide w/Device Kit Use as directed Dx e11.65   Accu-Chek Softclix Lancets lancets Use 1 lancet 3 times daily to check glucose for diabetes   albuterol  (5 MG/ML) 0.5% nebulizer  solution Commonly known as: PROVENTIL  Take 2.5 mg by nebulization every 6 (six) hours as needed for wheezing or shortness of breath.   albuterol  108 (90  Base) MCG/ACT inhaler Commonly known as: VENTOLIN  HFA INHALE 2 PUFFS BY MOUTH EVERY 6 HOURS AS NEEDED FOR WHEEZING OR SHORTNESS OF BREATH   ALPRAZolam  1 MG tablet Commonly known as: XANAX  Take 1 tablet (1 mg total) by mouth 2 (two) times daily as needed for anxiety or sleep.   amLODipine  2.5 MG tablet Commonly known as: NORVASC  Take 1 tablet (2.5 mg total) by mouth daily.   atorvastatin  10 MG tablet Commonly known as: Lipitor Take 1 tablet (10 mg total) by mouth daily.   budesonide -glycopyrrolate -formoterol  160-9-4.8 MCG/ACT Aero inhaler Commonly known as: BREZTRI  Inhale 2 puffs into the lungs 2 (two) times daily.   calcium  carbonate 1500 (600 Ca) MG Tabs tablet Commonly known as: OSCAL Take 600 mg of elemental calcium  by mouth 2 (two) times daily with a meal.   clopidogrel  75 MG tablet Commonly known as: PLAVIX  Take 1 tablet (75 mg total) by mouth daily.   escitalopram  10 MG tablet Commonly known as: LEXAPRO  Take 1 tablet by mouth once daily   ferrous sulfate  325 (65 FE) MG tablet Take 1 tablet (325 mg total) by mouth daily with breakfast.   furosemide  20 MG tablet Commonly known as: LASIX  Take 1 tablet (20 mg total) by mouth daily as needed.   ipratropium-albuterol  0.5-2.5 (3) MG/3ML Soln Commonly known as: DUONEB USE 1 VIAL IN NEBULIZER EVERY 6 HOURS AS NEEDED   losartan  50 MG tablet Commonly known as: COZAAR  Take 1 tablet (50 mg total) by mouth daily.   metFORMIN  500 MG 24 hr tablet Commonly known as: GLUCOPHAGE -XR TAKE 2 TABLETS BY MOUTH ONCE DAILY WITH BREAKFAST   metoprolol  succinate 50 MG 24 hr tablet Commonly known as: TOPROL -XL Take 1 tablet (50 mg total) by mouth daily. Take with or immediately following a meal. May take 1/2 tablet if BP is less than 120/80.   oxyCODONE -acetaminophen  7.5-325 MG  tablet Commonly known as: PERCOCET Take 1 tablet by mouth 2 (two) times daily as needed for severe pain (pain score 7-10).   OXYGEN  Inhale 4 L into the lungs. PT USES [employer] FOR OXYGEN    pantoprazole  40 MG tablet Commonly known as: Protonix  Take 1 tablet (40 mg total) by mouth daily.   potassium chloride  10 MEQ tablet Commonly known as: KLOR-CON  TAKE 1 TABLET BY MOUTH EVERY OTHER DAY   ReliOn Pen Needles 31G X 6 MM Misc Generic drug: Insulin  Pen Needle USE 1  pen needle with insulin  pen ONCE DAILY for diabetes   [employer]  SoloStar 300 UNIT/ML Solostar Pen Generic drug: insulin  glargine (1 Unit Dial ) INJECT 10 UNITS SUBCUTANEOUSLY ONCE DAILY   vitamin B-12 100 MCG tablet Commonly known as: CYANOCOBALAMIN  Take 100 mcg by mouth daily.   VITAMIN D  (CHOLECALCIFEROL ) PO Take 1 tablet by mouth in the morning.         Physical Exam: Constitutional: Patient appears well-developed and well-nourished. Not in obvious distress. HENT: Normocephalic, atraumatic, External right and left ear normal. Oropharynx is clear and moist.  Eyes: Conjunctivae and EOM are normal. PERRLA, no scleral icterus. Neck: Normal ROM. Neck supple. No JVD. No tracheal deviation. No thyromegaly. CVS: RRR, S1/S2 +, no murmurs, no gallops, no carotid bruit.  Pulmonary: Effort and breath sounds normal, no stridor, rhonchi, wheezes, rales. On continuous oxygen  Abdominal: Soft. BS +, no distension, tenderness, rebound or guarding.  Musculoskeletal: Normal range of motion. No edema and no tenderness.  Lymphadenopathy: No lymphadenopathy noted, cervical, inguinal or axillary Neuro: Alert. Normal reflexes, muscle tone coordination. No cranial nerve  deficit. Skin: Skin is warm and dry. No rash noted. Not diaphoretic. No erythema. No pallor. Psychiatric: Normal mood and affect. Behavior, judgment, thought content normal.   Data Review   Micro Results No results found for this or any previous visit (from the  past 240 hours).   CBC Recent Labs  Lab 09/03/24 0545 09/03/24 1100 09/04/24 0544  WBC 7.7 9.8 8.7  HGB 10.5* 10.3* 10.9*  HCT 36.5 34.5* 36.8  PLT 318 283 318  MCV 91.7 89.4 89.1  MCH 26.4 26.7 26.4  MCHC 28.8* 29.9* 29.6*  RDW 14.4 14.6 14.6  LYMPHSABS 1.2  --   --   MONOABS 0.6  --   --   EOSABS 0.0  --   --   BASOSABS 0.0  --   --     Chemistries  Recent Labs  Lab 09/03/24 0545 09/03/24 1100 09/04/24 0544  NA 142  --  144  K 3.6  --  3.7  CL 101  --  98  CO2 32  --  34*  GLUCOSE 198*  --  112*  BUN 10  --  14  CREATININE 0.59 0.61 0.58  CALCIUM  9.5  --  9.9  AST 21  --   --   ALT 15  --   --   ALKPHOS 54  --   --   BILITOT 0.5  --   --    ------------------------------------------------------------------------------------------------------------------ estimated creatinine clearance is 45.9 mL/min (by C-G formula based on SCr of 0.58 mg/dL). ------------------------------------------------------------------------------------------------------------------ No results for input(s): HGBA1C in the last 72 hours. ------------------------------------------------------------------------------------------------------------------ No results for input(s): CHOL, HDL, LDLCALC, TRIG, CHOLHDL, LDLDIRECT in the last 72 hours. ------------------------------------------------------------------------------------------------------------------ No results for input(s): TSH, T4TOTAL, T3FREE, THYROIDAB in the last 72 hours.  Invalid input(s): FREET3 ------------------------------------------------------------------------------------------------------------------ No results for input(s): VITAMINB12, FOLATE, FERRITIN, TIBC, IRON , RETICCTPCT in the last 72 hours.  Coagulation profile No results for input(s): INR, PROTIME in the last 168 hours.  No results for input(s): DDIMER in the last 72 hours.  Cardiac Enzymes No results for input(s):  CKMB, TROPONINI, MYOGLOBIN in the last 168 hours.  Invalid input(s): CK ------------------------------------------------------------------------------------------------------------------ Invalid input(s): POCBNP  Return in about 3 months (around 11/29/2024) for AWV, Norleen Xie PCP and med refills. .   Time Spent in minutes  45 Time spent with patient included reviewing progress notes, labs, imaging studies, and discussing plan for follow up.    This patient was seen by Mardy Maxin, FNP-C in collaboration with Dr. Sigrid Bathe as a part of collaborative care agreement.    Mardy Maxin MSN, FNP-C on 09/08/2024 at 12:08 PM   **Disclaimer: This note may have been dictated with voice recognition software. Similar sounding words can inadvertently be transcribed and this note may contain transcription errors which may not have been corrected upon publication of note.**

## 2024-09-11 ENCOUNTER — Encounter

## 2024-09-12 ENCOUNTER — Encounter: Payer: Self-pay | Admitting: Nurse Practitioner

## 2024-09-13 ENCOUNTER — Ambulatory Visit (INDEPENDENT_AMBULATORY_CARE_PROVIDER_SITE_OTHER): Admitting: Nurse Practitioner

## 2024-09-13 ENCOUNTER — Ambulatory Visit (INDEPENDENT_AMBULATORY_CARE_PROVIDER_SITE_OTHER)

## 2024-09-13 ENCOUNTER — Encounter (INDEPENDENT_AMBULATORY_CARE_PROVIDER_SITE_OTHER): Payer: Self-pay | Admitting: Nurse Practitioner

## 2024-09-13 ENCOUNTER — Encounter

## 2024-09-13 VITALS — BP 150/87 | HR 92 | Resp 18

## 2024-09-13 DIAGNOSIS — I739 Peripheral vascular disease, unspecified: Secondary | ICD-10-CM | POA: Diagnosis not present

## 2024-09-13 DIAGNOSIS — I10 Essential (primary) hypertension: Secondary | ICD-10-CM | POA: Diagnosis not present

## 2024-09-13 DIAGNOSIS — G629 Polyneuropathy, unspecified: Secondary | ICD-10-CM | POA: Diagnosis not present

## 2024-09-13 DIAGNOSIS — Z9889 Other specified postprocedural states: Secondary | ICD-10-CM | POA: Diagnosis not present

## 2024-09-13 NOTE — Progress Notes (Signed)
 "  Subjective:    [name], female    DOB: [birth year], 69 y.o.   MRN: [id number] Chief Complaint  Patient presents with   Follow-up    8 week abi follow up    HPI  Discussed the use of AI scribe software for clinical note transcription with the patient, who gave verbal consent to proceed.  History of Present Illness Angela Jensen is a 68 year old female with peripheral arterial disease status post lower extremity revascularization who presents for follow-up of persistent foot numbness and neuropathy.  She reports persistent numbness in her foot, described as a constant burning and stinging sensation, unchanged since her last visit three months ago. She likens the sensation to wearing a sock that is not present. Tactile sensation is diminished, particularly to light touch. She denies pain at rest or with ambulation.  She is able to ambulate without pain, and her walking distance is not limited by her symptoms. She does not experience claudication, cramping, heaviness, or episodes of leg weakness during ambulation. Prior to revascularization, she was unable to walk due to pain, but currently her only symptom is numbness. She notes some difficulty with balance due to sensory loss.  She denies any new wounds or non-healing ulcers on her foot and remains vigilant for unnoticed injuries given her sensory deficits.    Results Diagnostic Lower extremity arterial duplex (09/13/2024): Left ankle flow 0.90, right ankle flow 0.95; left toe flow 0.70, right toe flow 0.89. Left waveform biphasic at ankle, triphasic at toes. Right waveform triphasic throughout. Flows stable. Toe flows within normal limits. Waveforms strong.   Review of Systems     Objective:   Physical Exam  Physical Exam    BP (!) 150/87 (BP Location: Left Arm)   Pulse 92   Resp 18   Past Medical History:  Diagnosis Date   Anemia    Arthritis    Asthma    Atherosclerosis of native arteries of extremity  with intermittent claudication 05/26/2016   Cancer (HCC) 2012   Right Lung CA   COPD (chronic obstructive pulmonary disease) (HCC)    Depression    Diabetes mellitus without complication (HCC)    Patient takes Janumet   Essential hypertension 05/26/2016   Heart failure (HCC) 2022   Hydropneumothorax 05/03/2020   Hypercholesteremia    Oxygen  dependent    2L at nite    PAD (peripheral artery disease) 06/22/2016   Peripheral vascular disease    Personal history of radiation therapy    Shortness of breath dyspnea    with exertion    Sialolithiasis    Sleep apnea    Wears dentures    full upper and lower    Social History   Socioeconomic History   Marital status: Widowed    Spouse name: Not on file   Number of children: Not on file   Years of education: Not on file   Highest education level: Not on file  Occupational History   Not on file  Tobacco Use   Smoking status: Former    Current packs/day: 0.00    Average packs/day: 1 pack/day for 37.0 years (37.0 ttl pk-yrs)    Types: Cigarettes    Start date: 02/06/1973    Quit date: 02/06/2010    Years since quitting: 14.6   Smokeless tobacco: Former    Types: Snuff  Vaping Use   Vaping status: Never Used  Substance and Sexual Activity   Alcohol use: Not  Currently    Alcohol/week: 5.0 standard drinks of alcohol    Types: 5 Cans of beer per week    Comment: /h x of alcohol abuse -stopped 2012- now drinks 5 beer per week   Drug use: Not Currently    Types: Marijuana, Crack cocaine, Cocaine    Comment: hx of cocaine use- last use 2015; last use marijuana6/22/19,   Sexual activity: Yes  Other Topics Concern   Not on file  Social History Narrative   Lives with Significant Other x 43 years   Social Drivers of Health   Tobacco Use: Medium Risk (09/13/2024)   Patient History    Smoking Tobacco Use: Former    Smokeless Tobacco Use: Former    Passive Exposure: Not on Actuary Strain: Low Risk  (08/28/2024)    Received from [facility]   Overall Financial Resource Strain (CARDIA)    Difficulty of Paying Living Expenses: Not hard at all  Recent Concern: Financial Resource Strain - Medium Risk (06/04/2024)   Received from [facility]   Overall Financial Resource Strain (CARDIA)    How hard is it for you to pay for the very basics like food, housing, medical care, and heating?: Somewhat hard  Food Insecurity: No Food Insecurity (09/03/2024)   Epic    Worried About Programme Researcher, Broadcasting/film/video in the Last Year: Never true    Ran Out of Food in the Last Year: Never true  Recent Concern: Food Insecurity - Food Insecurity Present (08/28/2024)   Received from [facility]   Epic    Within the past 12 months, you worried that your food would run out before you got the money to buy more.: Never true    Within the past 12 months, the food you bought just didn't last and you didn't have money to get more.: Often true  Transportation Needs: No Transportation Needs (09/03/2024)   Epic    Lack of Transportation (Medical): No    Lack of Transportation (Non-Medical): No  Physical Activity: Not on file  Stress: Not on file  Social Connections: Moderately Isolated (09/03/2024)   Social Connection and Isolation Panel    Frequency of Communication with Friends and Family: More than three times a week    Frequency of Social Gatherings with Friends and Family: More than three times a week    Attends Religious Services: Never    Database Administrator or Organizations: No    Attends Banker Meetings: Never    Marital Status: Living with partner  Intimate Partner Violence: Not At Risk (09/03/2024)   Epic    Fear of Current or Ex-Partner: No    Emotionally Abused: No    Physically Abused: No    Sexually Abused: No  Depression (PHQ2-9): Medium Risk (04/13/2024)   Depression (PHQ2-9)    PHQ-2 Score: 9  Alcohol Screen: Low Risk (12/11/2021)   Alcohol Screen    Last  Alcohol Screening Score (AUDIT): 0  Housing: Low Risk (09/03/2024)   Epic    Unable to Pay for Housing in the Last Year: No    Number of Times Moved in the Last Year: 0    Homeless in the Last Year: No  Utilities: Not At Risk (09/03/2024)   Epic    Threatened with loss of utilities: No  Health Literacy: Not on file    Past Surgical History:  Procedure Laterality Date   APPLICATION OF CELL SAVER Left 01/21/2024  Procedure: APPLICATION OF CELL SAVER;  Surgeon: Marea Selinda RAMAN, MD;  Location: [facility];  Service: Vascular;  Laterality: Left;   CESAREAN SECTION     x3   COLONOSCOPY N/A 04/03/2024   Procedure: COLONOSCOPY;  Surgeon: Jinny Carmine, MD;  Location: [facility];  Service: Endoscopy;  Laterality: N/A;   COLONOSCOPY WITH PROPOFOL  N/A 06/25/2015   Procedure: COLONOSCOPY WITH PROPOFOL ;  Surgeon: Carmine Jinny, MD;  Location: [facility];  Service: Endoscopy;  Laterality: N/A;   COLONOSCOPY WITH PROPOFOL  N/A 07/26/2020   Procedure: COLONOSCOPY WITH PROPOFOL ;  Surgeon: Jinny Carmine, MD;  Location: [facility];  Service: Endoscopy;  Laterality: N/A;   CYST REMOVAL LEG     and on shoulder    ENDARTERECTOMY FEMORAL Left 01/21/2024   Procedure: LEFT COMMON, SUPERFICIAL FEMORAL AND PROFUNDA FEMORIS ENDARTERECTOMY WITH BOVINE PERICARDIAL PATCH ANGIOPLASTY;  Surgeon: Marea Selinda RAMAN, MD;  Location: [facility];  Service: Vascular;  Laterality: Left;   ESOPHAGOGASTRODUODENOSCOPY (EGD) WITH PROPOFOL  N/A 07/26/2020   Procedure: ESOPHAGOGASTRODUODENOSCOPY (EGD) WITH PROPOFOL ;  Surgeon: Jinny Carmine, MD;  Location: [facility];  Service: Endoscopy;  Laterality: N/A;  Diabetic - oral meds   INSERTION OF ILIAC STENT Left 01/21/2024   Procedure: INSERTION, STENT, ARTERY, ILIAC;  Surgeon: Marea Selinda RAMAN, MD;  Location: [facility];  Service: Vascular;  Laterality: Left;  AND SFA STENTS   LOWER EXTREMITY ANGIOGRAPHY Left 09/29/2018   Procedure: LOWER EXTREMITY ANGIOGRAPHY;  Surgeon: Marea Selinda RAMAN, MD;   Location: [facility];  Service: Cardiovascular;  Laterality: Left;   LOWER EXTREMITY ANGIOGRAPHY Left 07/29/2023   Procedure: Lower Extremity Angiography;  Surgeon: Marea Selinda RAMAN, MD;  Location: [facility];  Service: Cardiovascular;  Laterality: Left;   LOWER EXTREMITY ANGIOGRAPHY Right 12/20/2023   Procedure: Lower Extremity Angiography;  Surgeon: Marea Selinda RAMAN, MD;  Location: [facility];  Service: Cardiovascular;  Laterality: Right;   LOWER EXTREMITY ANGIOGRAPHY Left 01/17/2024   Procedure: Lower Extremity Angiography;  Surgeon: Marea Selinda RAMAN, MD;  Location: [facility];  Service: Cardiovascular;  Laterality: Left;   LUNG BIOPSY  12/30/2011   has lung spots   PACEMAKER IMPLANT  07/14/2021   PACEMAKER LEADLESS INSERTION N/A 07/14/2021   Procedure: PACEMAKER LEADLESS INSERTION;  Surgeon: Ammon Blunt, MD;  Location: [facility];  Service: Cardiovascular;  Laterality: N/A;   PERIPHERAL VASCULAR CATHETERIZATION Left 06/01/2016   Procedure: Lower Extremity Angiography;  Surgeon: Selinda RAMAN Marea, MD;  Location: [facility];  Service: Cardiovascular;  Laterality: Left;   PERIPHERAL VASCULAR CATHETERIZATION N/A 06/01/2016   Procedure: Abdominal Aortogram w/Lower Extremity;  Surgeon: Selinda RAMAN Marea, MD;  Location: [facility];  Service: Cardiovascular;  Laterality: N/A;   PERIPHERAL VASCULAR CATHETERIZATION  06/01/2016   Procedure: Lower Extremity Intervention;  Surgeon: Selinda RAMAN Marea, MD;  Location: [facility];  Service: Cardiovascular;;   PERIPHERAL VASCULAR CATHETERIZATION Right 06/08/2016   Procedure: Lower Extremity Angiography;  Surgeon: Selinda RAMAN Marea, MD;  Location: [facility];  Service: Cardiovascular;  Laterality: Right;   PERIPHERAL VASCULAR CATHETERIZATION  06/08/2016   Procedure: Lower Extremity Intervention;  Surgeon: Selinda RAMAN Marea, MD;  Location: [facility];  Service: Cardiovascular;;   POLYPECTOMY   04/03/2024   Procedure: POLYPECTOMY, INTESTINE;  Surgeon: Jinny Carmine, MD;  Location: [facility];  Service: Endoscopy;;   SUBMANDIBULAR GLAND EXCISION Left 12/06/2020   Procedure: EXCISION SUBMANDIBULAR GLAND;  Surgeon: Herminio Miu, MD;  Location: [facility];  Service: ENT;  Laterality: Left;  needs to be first case Diabetic - diet controlled   TEMPORARY PACEMAKER N/A 07/11/2021   Procedure: TEMPORARY PACEMAKER;  Surgeon: Ammon Blunt, MD;  Location: [facility];  Service: Cardiovascular;  Laterality: N/A;    Family History  Problem Relation Age of Onset   Diabetes Mother    Hypercholesterolemia Mother    Lung cancer Father    Diabetes Sister    Diabetes Sister    Hypertension Sister    Diabetes Maternal Grandmother    Diabetes Paternal Grandmother    Heart attack Brother    Coronary artery disease Brother    Vascular Disease Brother    Heart attack Brother    Breast cancer Neg Hx     Allergies[1]     Latest Ref Rng & Units 09/04/2024    5:44 AM 09/03/2024   11:00 AM 09/03/2024    5:45 AM  CBC  WBC 4.0 - 10.5 K/uL 8.7  9.8  7.7   Hemoglobin 12.0 - 15.0 g/dL 89.0  89.6  89.4   Hematocrit 36.0 - 46.0 % 36.8  34.5  36.5   Platelets 150 - 400 K/uL 318  283  318       CMP     Component Value Date/Time   NA 144 09/04/2024 0544   NA 144 10/04/2023 1047   NA 141 04/17/2014 1055   K 3.7 09/04/2024 0544   K 3.9 04/17/2014 1055   CL 98 09/04/2024 0544   CL 102 04/17/2014 1055   CO2 34 (H) 09/04/2024 0544   CO2 30 04/17/2014 1055   GLUCOSE 112 (H) 09/04/2024 0544   GLUCOSE 201 (H) 04/17/2014 1055   BUN 14 09/04/2024 0544   BUN 12 10/04/2023 1047   BUN 10 04/17/2014 1055   CREATININE 0.58 09/04/2024 0544   CREATININE 0.64 06/27/2024 1411   CREATININE 0.90 04/17/2014 1055   CALCIUM  9.9 09/04/2024 0544   CALCIUM  9.1 04/17/2014 1055   PROT 6.7 09/03/2024 0545   PROT 6.9 10/04/2023 1047   PROT 8.1 04/17/2014 1055   ALBUMIN 4.1  09/03/2024 0545   ALBUMIN 4.4 10/04/2023 1047   ALBUMIN 4.1 04/17/2014 1055   AST 21 09/03/2024 0545   AST 21 06/27/2024 1411   ALT 15 09/03/2024 0545   ALT 17 06/27/2024 1411   ALT 29 04/17/2014 1055   ALKPHOS 54 09/03/2024 0545   ALKPHOS 73 04/17/2014 1055   BILITOT 0.5 09/03/2024 0545   BILITOT 0.8 06/27/2024 1411   EGFR 97 10/04/2023 1047   GFRNONAA >60 09/04/2024 0544   GFRNONAA >60 06/27/2024 1411   GFRNONAA >60 04/17/2014 1055     No results found.     Assessment & Plan:   1. Peripheral arterial disease with history of revascularization (Primary) Peripheral arterial disease with history of revascularization Severe peripheral arterial disease post-revascularization with stable lower extremity perfusion. No claudication or ambulation limitation. Continued surveillance necessary due to progression risk. - Reviewed vascular studies confirming stable perfusion. - Recommended follow-up in 3 months; if stable, extend to 6 months. - Instructed to monitor for new or worsening symptoms and report promptly. - Advised vigilance for non-healing wounds or ulcers and to notify clinic if these develop. - VAS US  ABI WITH/WO TBI; Future - VAS US  LOWER EXTREMITY ARTERIAL DUPLEX; Future  2. Essential hypertension Continue antihypertensive medications as already ordered, these medications have been reviewed and there are no changes at this time.  3. 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 Neuropathy stable, no pain or functional limitation, but increased risk for unnoticed injury. - Advised regular ambulation to promote circulation and maintain function, with precautions to prevent falls or injury. - Recommended use of assistance or shopping cart for stability as needed. - Instructed to monitor for unnoticed foot injuries and seek care if wounds or abnormalities are  identified.   Assessment and Plan Assessment & Plan        Medications Ordered Prior to Encounter[2]  There are no Patient Instructions on file for this visit. Return in about 3 months (around 12/12/2024) for ABI 3 months JD/FB.   Marcianne Ozbun E Aiken Withem, NP      [1]  Allergies Allergen Reactions   Iodinated Contrast Media Itching   Trelegy Ellipta  [Fluticasone -Umeclidin-Vilant]     Had breathing issues   Bactoshield Chg [Chlorhexidine  Gluconate] Itching and Other (See Comments)    Dry. Flaking, peeling skin  [2]  Current Outpatient Medications on File Prior to Visit  Medication Sig Dispense Refill   ACCU-CHEK GUIDE TEST test strip USE TO CHECK BLOOD SUGAR 5 TIMES DAILY 450 each 0   Accu-Chek Softclix Lancets lancets Use 1 lancet 3 times daily to check glucose for diabetes 300 each 1   albuterol  (PROVENTIL ) (5 MG/ML) 0.5% nebulizer solution Take 2.5 mg by nebulization every 6 (six) hours as needed for wheezing or shortness of breath.     albuterol  (VENTOLIN  HFA) 108 (90 Base) MCG/ACT inhaler INHALE 2 PUFFS BY MOUTH EVERY 6 HOURS AS NEEDED FOR WHEEZING OR SHORTNESS OF BREATH 3 each 5   ALPRAZolam  (XANAX ) 1 MG tablet Take 1 tablet (1 mg total) by mouth 2 (two) times daily as needed for anxiety or sleep. 60 tablet 2   amLODipine  (NORVASC ) 2.5 MG tablet Take 1 tablet (2.5 mg total) by mouth daily. 90 tablet 3   atorvastatin  (LIPITOR) 10 MG tablet Take 1 tablet (10 mg total) by mouth daily. 30 tablet 11   Blood Glucose Monitoring Suppl (ACCU-CHEK GUIDE) w/Device KIT Use as directed Dx e11.65 1 kit 0   budesonide -glycopyrrolate -formoterol  (BREZTRI ) 160-9-4.8 MCG/ACT AERO inhaler Inhale 2 puffs into the lungs 2 (two) times daily. 10.7 g 11   calcium  carbonate (OSCAL) 1500 (600 Ca) MG TABS tablet Take 600 mg of elemental calcium  by mouth 2 (two) times daily with a meal.     clopidogrel  (PLAVIX ) 75 MG tablet Take 1 tablet (75 mg total) by mouth daily. 90 tablet 3   escitalopram  (LEXAPRO ) 10  MG tablet Take 1 tablet by mouth once daily 90 tablet 0   ferrous sulfate  325 (65 FE) MG tablet Take 1 tablet (325 mg total) by mouth daily with breakfast.     furosemide  (LASIX ) 20 MG tablet Take 1 tablet (20 mg total) by mouth daily as needed. 30 tablet 5   insulin  glargine, 1 Unit Dial , (TOUJEO  SOLOSTAR) 300 UNIT/ML Solostar Pen INJECT 10 UNITS SUBCUTANEOUSLY ONCE DAILY 6 mL 2   Insulin  Pen Needle (RELION PEN NEEDLES) 31G X 6 MM MISC USE 1  pen needle with insulin  pen ONCE DAILY for diabetes 50 each 11   ipratropium-albuterol  (DUONEB) 0.5-2.5 (3) MG/3ML SOLN USE 1 VIAL IN NEBULIZER EVERY 6 HOURS AS NEEDED 360 mL 2   losartan  (COZAAR ) 50 MG tablet Take 1 tablet (50 mg total) by mouth daily. 90 tablet 1   metFORMIN  (GLUCOPHAGE -XR) 500 MG 24 hr tablet TAKE 2 TABLETS BY MOUTH ONCE DAILY  WITH BREAKFAST 180 tablet 3   metoprolol  succinate (TOPROL -XL) 50 MG 24 hr tablet Take 1 tablet (50 mg total) by mouth daily. Take with or immediately following a meal. May take 1/2 tablet if BP is less than 120/80. 90 tablet 3   oxyCODONE -acetaminophen  (PERCOCET) 7.5-325 MG tablet Take 1 tablet by mouth 2 (two) times daily as needed for severe pain (pain score 7-10). 60 tablet 0   OXYGEN  Inhale 4 L into the lungs. PT USES [facility] FOR OXYGEN      pantoprazole  (PROTONIX ) 40 MG tablet Take 1 tablet (40 mg total) by mouth daily. 90 tablet 3   potassium chloride  (KLOR-CON ) 10 MEQ tablet TAKE 1 TABLET BY MOUTH EVERY OTHER DAY 45 tablet 0   vitamin B-12 (CYANOCOBALAMIN ) 100 MCG tablet Take 100 mcg by mouth daily.     VITAMIN D , CHOLECALCIFEROL , PO Take 1 tablet by mouth in the morning.     No current facility-administered medications on file prior to visit.   "

## 2024-09-14 LAB — VAS US ABI WITH/WO TBI
Left ABI: 0.9
Right ABI: 0.95

## 2024-09-18 ENCOUNTER — Other Ambulatory Visit (INDEPENDENT_AMBULATORY_CARE_PROVIDER_SITE_OTHER): Payer: Self-pay | Admitting: Vascular Surgery

## 2024-09-18 ENCOUNTER — Encounter

## 2024-09-20 ENCOUNTER — Encounter: Attending: Pulmonary Disease

## 2024-09-20 DIAGNOSIS — J449 Chronic obstructive pulmonary disease, unspecified: Secondary | ICD-10-CM

## 2024-09-20 NOTE — Progress Notes (Signed)
 Daily Session Note  Patient Details  Name: Angela Jensen MRN: [id number] Date of Birth: 1955/12/28 Referring Provider:   [facility] from 04/13/2024 in [facility]  Referring Provider Parris Manna, MD    Encounter Date: 09/20/2024  Check In:  Session Check In - 09/20/24 1345       Check-In   Supervising physician immediately available to respond to emergencies See telemetry face sheet for immediately available ER MD    Location [facility]    Staff Present Burnard Davenport RN,BSN,MPA;Laura Cates RN,BSN;Pilar Westergaard Dyane BS, ACSM CEP, Exercise Physiologist;Noah Tickle, BS, Exercise Physiologist    Virtual Visit No    Medication changes reported     No    Fall or balance concerns reported    No    Warm-up and Cool-down Performed on first and last piece of equipment    Resistance Training Performed Yes    VAD Patient? No    PAD/SET Patient? No      Pain Assessment   Currently in Pain? No/denies             Tobacco Use History[1]  Goals Met:  Proper associated with RPD/PD & O2 Sat Independence with exercise equipment Using PLB without cueing & demonstrates good technique Exercise tolerated well No report of concerns or symptoms today Strength training completed today  Goals Unmet:  Not Applicable  Comments: Pt able to follow exercise prescription today without complaint.  Will continue to monitor for progression.   Reviewed home exercise with pt today from 2:03 to 2:15.  Pt plans to use a peddle machine and hand weights at home for exercise.  Reviewed THR, pulse, RPE, sign and symptoms, pulse oximetery and when to call 911 or MD.  Also discussed weather considerations and indoor options.  Pt voiced understanding.   Dr. Oneil Pinal is Medical Director for [facility].  Dr. Fuad Aleskerov is Medical Director for [facility].     [1]  Social History Tobacco Use   Smoking Status Former   Current packs/day: 0.00   Average packs/day: 1 pack/day for 37.0 years (37.0 ttl pk-yrs)   Types: Cigarettes   Start date: 02/06/1973   Quit date: 02/06/2010   Years since quitting: 14.6  Smokeless Tobacco Former   Types: Snuff

## 2024-09-25 ENCOUNTER — Encounter

## 2024-09-27 ENCOUNTER — Encounter

## 2024-10-02 ENCOUNTER — Encounter

## 2024-10-04 ENCOUNTER — Encounter

## 2024-10-09 ENCOUNTER — Encounter

## 2024-10-11 ENCOUNTER — Encounter

## 2024-10-16 ENCOUNTER — Encounter: Attending: Pulmonary Disease

## 2024-10-18 ENCOUNTER — Encounter

## 2024-12-07 ENCOUNTER — Ambulatory Visit: Admitting: Nurse Practitioner

## 2024-12-12 ENCOUNTER — Encounter (INDEPENDENT_AMBULATORY_CARE_PROVIDER_SITE_OTHER)

## 2024-12-12 ENCOUNTER — Ambulatory Visit (INDEPENDENT_AMBULATORY_CARE_PROVIDER_SITE_OTHER): Admitting: Nurse Practitioner

## 2024-12-26 ENCOUNTER — Inpatient Hospital Stay

## 2025-01-03 ENCOUNTER — Inpatient Hospital Stay: Admitting: Oncology

## 2025-01-03 ENCOUNTER — Inpatient Hospital Stay
